# Patient Record
Sex: Male | Born: 1955 | Race: Black or African American | Hispanic: No | Marital: Single | Smoking: Never smoker
Health system: Southern US, Community
[De-identification: ages and names within clinical notes are randomized; demographics above are authoritative.]

## PROBLEM LIST (undated history)

## (undated) DIAGNOSIS — IMO0001 Reserved for inherently not codable concepts without codable children: Secondary | ICD-10-CM

## (undated) DIAGNOSIS — Z860100 Personal history of colon polyps, unspecified: Secondary | ICD-10-CM

## (undated) DIAGNOSIS — N184 Chronic kidney disease, stage 4 (severe): Secondary | ICD-10-CM

## (undated) DIAGNOSIS — G4730 Sleep apnea, unspecified: Secondary | ICD-10-CM

## (undated) DIAGNOSIS — M5430 Sciatica, unspecified side: Secondary | ICD-10-CM

## (undated) DIAGNOSIS — E785 Hyperlipidemia, unspecified: Secondary | ICD-10-CM

## (undated) DIAGNOSIS — K219 Gastro-esophageal reflux disease without esophagitis: Secondary | ICD-10-CM

## (undated) DIAGNOSIS — I4892 Unspecified atrial flutter: Secondary | ICD-10-CM

## (undated) DIAGNOSIS — I5020 Unspecified systolic (congestive) heart failure: Secondary | ICD-10-CM

## (undated) DIAGNOSIS — N2581 Secondary hyperparathyroidism of renal origin: Secondary | ICD-10-CM

## (undated) DIAGNOSIS — B1920 Unspecified viral hepatitis C without hepatic coma: Secondary | ICD-10-CM

## (undated) DIAGNOSIS — J189 Pneumonia, unspecified organism: Secondary | ICD-10-CM

## (undated) DIAGNOSIS — I10 Essential (primary) hypertension: Secondary | ICD-10-CM

## (undated) DIAGNOSIS — Z8619 Personal history of other infectious and parasitic diseases: Secondary | ICD-10-CM

## (undated) DIAGNOSIS — Z8709 Personal history of other diseases of the respiratory system: Secondary | ICD-10-CM

## (undated) DIAGNOSIS — M109 Gout, unspecified: Secondary | ICD-10-CM

## (undated) DIAGNOSIS — R350 Frequency of micturition: Secondary | ICD-10-CM

## (undated) DIAGNOSIS — I428 Other cardiomyopathies: Secondary | ICD-10-CM

## (undated) DIAGNOSIS — S81802A Unspecified open wound, left lower leg, initial encounter: Secondary | ICD-10-CM

## (undated) DIAGNOSIS — Z87442 Personal history of urinary calculi: Secondary | ICD-10-CM

## (undated) DIAGNOSIS — Z9289 Personal history of other medical treatment: Secondary | ICD-10-CM

## (undated) DIAGNOSIS — M2550 Pain in unspecified joint: Secondary | ICD-10-CM

## (undated) HISTORY — PX: OTHER SURGICAL HISTORY: SHX169

## (undated) HISTORY — PX: JOINT REPLACEMENT: SHX530

## (undated) HISTORY — DX: Personal history of other medical treatment: Z92.89

## (undated) HISTORY — PX: NEPHRECTOMY TRANSPLANTED ORGAN: SUR880

## (undated) HISTORY — PX: HIP SURGERY: SHX245

---

## 1994-01-27 DIAGNOSIS — B1920 Unspecified viral hepatitis C without hepatic coma: Secondary | ICD-10-CM

## 1994-01-27 HISTORY — DX: Unspecified viral hepatitis C without hepatic coma: B19.20

## 1998-08-04 ENCOUNTER — Emergency Department (HOSPITAL_COMMUNITY): Admission: EM | Admit: 1998-08-04 | Discharge: 1998-08-04 | Payer: Self-pay | Admitting: Emergency Medicine

## 2005-09-24 ENCOUNTER — Encounter: Admission: RE | Admit: 2005-09-24 | Discharge: 2005-09-24 | Payer: Self-pay | Admitting: Nephrology

## 2007-02-15 ENCOUNTER — Encounter: Admission: RE | Admit: 2007-02-15 | Discharge: 2007-02-15 | Payer: Self-pay | Admitting: General Surgery

## 2007-03-08 ENCOUNTER — Ambulatory Visit (HOSPITAL_COMMUNITY): Admission: RE | Admit: 2007-03-08 | Discharge: 2007-03-08 | Payer: Self-pay | Admitting: General Surgery

## 2007-03-08 ENCOUNTER — Encounter (INDEPENDENT_AMBULATORY_CARE_PROVIDER_SITE_OTHER): Payer: Self-pay | Admitting: General Surgery

## 2008-07-25 ENCOUNTER — Encounter: Payer: Self-pay | Admitting: Internal Medicine

## 2008-08-02 ENCOUNTER — Ambulatory Visit: Payer: Self-pay | Admitting: Internal Medicine

## 2008-08-15 ENCOUNTER — Ambulatory Visit: Payer: Self-pay | Admitting: Internal Medicine

## 2008-08-15 ENCOUNTER — Encounter: Payer: Self-pay | Admitting: Internal Medicine

## 2008-08-16 ENCOUNTER — Encounter: Payer: Self-pay | Admitting: Internal Medicine

## 2008-09-14 ENCOUNTER — Encounter: Payer: Self-pay | Admitting: Internal Medicine

## 2009-03-16 ENCOUNTER — Emergency Department (HOSPITAL_COMMUNITY): Admission: EM | Admit: 2009-03-16 | Discharge: 2009-03-16 | Payer: Self-pay | Admitting: Emergency Medicine

## 2009-03-30 ENCOUNTER — Ambulatory Visit: Payer: Self-pay | Admitting: Diagnostic Radiology

## 2009-03-30 ENCOUNTER — Encounter: Payer: Self-pay | Admitting: Emergency Medicine

## 2009-03-30 ENCOUNTER — Emergency Department (HOSPITAL_COMMUNITY): Admission: EM | Admit: 2009-03-30 | Discharge: 2009-03-30 | Payer: Self-pay | Admitting: Emergency Medicine

## 2009-03-31 ENCOUNTER — Ambulatory Visit: Payer: Self-pay | Admitting: Internal Medicine

## 2009-03-31 ENCOUNTER — Ambulatory Visit: Payer: Self-pay | Admitting: Critical Care Medicine

## 2009-03-31 ENCOUNTER — Inpatient Hospital Stay (HOSPITAL_COMMUNITY): Admission: EM | Admit: 2009-03-31 | Discharge: 2009-04-13 | Payer: Self-pay | Admitting: Emergency Medicine

## 2009-03-31 ENCOUNTER — Ambulatory Visit: Payer: Self-pay | Admitting: Diagnostic Radiology

## 2009-04-04 ENCOUNTER — Encounter (INDEPENDENT_AMBULATORY_CARE_PROVIDER_SITE_OTHER): Payer: Self-pay | Admitting: Internal Medicine

## 2009-04-30 ENCOUNTER — Encounter (HOSPITAL_COMMUNITY): Admission: RE | Admit: 2009-04-30 | Discharge: 2009-07-29 | Payer: Self-pay | Admitting: Nephrology

## 2009-06-21 ENCOUNTER — Encounter: Admission: RE | Admit: 2009-06-21 | Discharge: 2009-08-01 | Payer: Self-pay | Admitting: Orthopedic Surgery

## 2009-12-03 ENCOUNTER — Encounter: Payer: Self-pay | Admitting: Internal Medicine

## 2010-01-21 ENCOUNTER — Emergency Department (HOSPITAL_COMMUNITY)
Admission: EM | Admit: 2010-01-21 | Discharge: 2010-01-21 | Payer: Self-pay | Source: Home / Self Care | Admitting: Emergency Medicine

## 2010-01-23 ENCOUNTER — Emergency Department (HOSPITAL_COMMUNITY)
Admission: EM | Admit: 2010-01-23 | Discharge: 2010-01-23 | Payer: Self-pay | Source: Home / Self Care | Admitting: Emergency Medicine

## 2010-01-26 ENCOUNTER — Emergency Department (HOSPITAL_COMMUNITY)
Admission: EM | Admit: 2010-01-26 | Discharge: 2010-01-26 | Payer: Self-pay | Source: Home / Self Care | Admitting: Emergency Medicine

## 2010-02-17 ENCOUNTER — Encounter: Payer: Self-pay | Admitting: Nephrology

## 2010-02-28 NOTE — Letter (Signed)
[facility]   Imported By: Sherian Rein 01/10/2010 09:59:55  _____________________________________________________________________  External Attachment:    Type:   Image     Comment:   External Document

## 2010-04-21 LAB — GLUCOSE, CAPILLARY
Glucose-Capillary: 102 mg/dL — ABNORMAL HIGH (ref 70–99)
Glucose-Capillary: 105 mg/dL — ABNORMAL HIGH (ref 70–99)
Glucose-Capillary: 105 mg/dL — ABNORMAL HIGH (ref 70–99)
Glucose-Capillary: 107 mg/dL — ABNORMAL HIGH (ref 70–99)
Glucose-Capillary: 114 mg/dL — ABNORMAL HIGH (ref 70–99)
Glucose-Capillary: 116 mg/dL — ABNORMAL HIGH (ref 70–99)
Glucose-Capillary: 122 mg/dL — ABNORMAL HIGH (ref 70–99)
Glucose-Capillary: 126 mg/dL — ABNORMAL HIGH (ref 70–99)
Glucose-Capillary: 130 mg/dL — ABNORMAL HIGH (ref 70–99)
Glucose-Capillary: 131 mg/dL — ABNORMAL HIGH (ref 70–99)
Glucose-Capillary: 132 mg/dL — ABNORMAL HIGH (ref 70–99)
Glucose-Capillary: 135 mg/dL — ABNORMAL HIGH (ref 70–99)
Glucose-Capillary: 137 mg/dL — ABNORMAL HIGH (ref 70–99)
Glucose-Capillary: 140 mg/dL — ABNORMAL HIGH (ref 70–99)
Glucose-Capillary: 141 mg/dL — ABNORMAL HIGH (ref 70–99)
Glucose-Capillary: 144 mg/dL — ABNORMAL HIGH (ref 70–99)
Glucose-Capillary: 151 mg/dL — ABNORMAL HIGH (ref 70–99)

## 2010-04-21 LAB — CBC
HCT: 24.7 % — ABNORMAL LOW (ref 39.0–52.0)
HCT: 25.6 % — ABNORMAL LOW (ref 39.0–52.0)
HCT: 30.8 % — ABNORMAL LOW (ref 39.0–52.0)
HCT: 31.1 % — ABNORMAL LOW (ref 39.0–52.0)
Hemoglobin: 10 g/dL — ABNORMAL LOW (ref 13.0–17.0)
Hemoglobin: 8.2 g/dL — ABNORMAL LOW (ref 13.0–17.0)
Hemoglobin: 8.6 g/dL — ABNORMAL LOW (ref 13.0–17.0)
Hemoglobin: 9.7 g/dL — ABNORMAL LOW (ref 13.0–17.0)
Hemoglobin: 9.9 g/dL — ABNORMAL LOW (ref 13.0–17.0)
MCHC: 33 g/dL (ref 30.0–36.0)
MCHC: 33.2 g/dL (ref 30.0–36.0)
MCHC: 33.3 g/dL (ref 30.0–36.0)
MCHC: 33.4 g/dL (ref 30.0–36.0)
MCHC: 34 g/dL (ref 30.0–36.0)
MCV: 94.6 fL (ref 78.0–100.0)
MCV: 94.8 fL (ref 78.0–100.0)
MCV: 94.8 fL (ref 78.0–100.0)
MCV: 96.4 fL (ref 78.0–100.0)
MCV: 96.6 fL (ref 78.0–100.0)
Platelets: 100 10*3/uL — ABNORMAL LOW (ref 150–400)
Platelets: 105 10*3/uL — ABNORMAL LOW (ref 150–400)
Platelets: 198 10*3/uL (ref 150–400)
Platelets: 208 10*3/uL (ref 150–400)
Platelets: 237 10*3/uL (ref 150–400)
RBC: 2.64 MIL/uL — ABNORMAL LOW (ref 4.22–5.81)
RBC: 2.72 MIL/uL — ABNORMAL LOW (ref 4.22–5.81)
RBC: 2.8 MIL/uL — ABNORMAL LOW (ref 4.22–5.81)
RBC: 3.01 MIL/uL — ABNORMAL LOW (ref 4.22–5.81)
RBC: 3.01 MIL/uL — ABNORMAL LOW (ref 4.22–5.81)
RBC: 3.16 MIL/uL — ABNORMAL LOW (ref 4.22–5.81)
RBC: 3.19 MIL/uL — ABNORMAL LOW (ref 4.22–5.81)
RBC: 3.25 MIL/uL — ABNORMAL LOW (ref 4.22–5.81)
RDW: 15.2 % (ref 11.5–15.5)
RDW: 15.6 % — ABNORMAL HIGH (ref 11.5–15.5)
RDW: 15.7 % — ABNORMAL HIGH (ref 11.5–15.5)
RDW: 15.7 % — ABNORMAL HIGH (ref 11.5–15.5)
RDW: 15.8 % — ABNORMAL HIGH (ref 11.5–15.5)
RDW: 15.9 % — ABNORMAL HIGH (ref 11.5–15.5)
RDW: 16.1 % — ABNORMAL HIGH (ref 11.5–15.5)
WBC: 13.9 10*3/uL — ABNORMAL HIGH (ref 4.0–10.5)
WBC: 14.4 10*3/uL — ABNORMAL HIGH (ref 4.0–10.5)
WBC: 15.6 10*3/uL — ABNORMAL HIGH (ref 4.0–10.5)
WBC: 17.4 10*3/uL — ABNORMAL HIGH (ref 4.0–10.5)
WBC: 17.5 10*3/uL — ABNORMAL HIGH (ref 4.0–10.5)
WBC: 23.6 10*3/uL — ABNORMAL HIGH (ref 4.0–10.5)

## 2010-04-21 LAB — COMPREHENSIVE METABOLIC PANEL
ALT: 18 U/L (ref 0–53)
ALT: 9 U/L (ref 0–53)
Albumin: 1.9 g/dL — ABNORMAL LOW (ref 3.5–5.2)
Alkaline Phosphatase: 37 U/L — ABNORMAL LOW (ref 39–117)
BUN: 133 mg/dL — ABNORMAL HIGH (ref 6–23)
BUN: 138 mg/dL — ABNORMAL HIGH (ref 6–23)
CO2: 17 mEq/L — ABNORMAL LOW (ref 19–32)
CO2: 22 mEq/L (ref 19–32)
Calcium: 8.7 mg/dL (ref 8.4–10.5)
Chloride: 100 mEq/L (ref 96–112)
Chloride: 95 mEq/L — ABNORMAL LOW (ref 96–112)
Chloride: 96 mEq/L (ref 96–112)
Creatinine, Ser: 5.17 mg/dL — ABNORMAL HIGH (ref 0.4–1.5)
GFR calc non Af Amer: 19 mL/min — ABNORMAL LOW (ref >60)
Glucose, Bld: 111 mg/dL — ABNORMAL HIGH (ref 70–99)
Glucose, Bld: 84 mg/dL (ref 70–99)
Glucose, Bld: 97 mg/dL (ref 70–99)
Potassium: 5.9 mEq/L — ABNORMAL HIGH (ref 3.5–5.1)
Sodium: 127 mEq/L — ABNORMAL LOW (ref 135–145)
Sodium: 127 mEq/L — ABNORMAL LOW (ref 135–145)
Total Bilirubin: 0.7 mg/dL (ref 0.3–1.2)
Total Bilirubin: 0.9 mg/dL (ref 0.3–1.2)
Total Bilirubin: 0.9 mg/dL (ref 0.3–1.2)
Total Protein: 6 g/dL (ref 6.0–8.3)

## 2010-04-21 LAB — BLOOD GAS, ARTERIAL
Acid-base deficit: 4.7 mmol/L — ABNORMAL HIGH (ref 0.0–2.0)
Drawn by: 325261
FIO2: 1 %
O2 Saturation: 96.9 %
pO2, Arterial: 83.9 mmHg (ref 80.0–100.0)

## 2010-04-21 LAB — BASIC METABOLIC PANEL
BUN: 121 mg/dL — ABNORMAL HIGH (ref 6–23)
BUN: 35 mg/dL — ABNORMAL HIGH (ref 6–23)
BUN: 43 mg/dL — ABNORMAL HIGH (ref 6–23)
CO2: 18 mEq/L — ABNORMAL LOW (ref 19–32)
CO2: 19 mEq/L (ref 19–32)
CO2: 22 mEq/L (ref 19–32)
CO2: 27 mEq/L (ref 19–32)
Calcium: 7.5 mg/dL — ABNORMAL LOW (ref 8.4–10.5)
Calcium: 8.2 mg/dL — ABNORMAL LOW (ref 8.4–10.5)
Calcium: 8.3 mg/dL — ABNORMAL LOW (ref 8.4–10.5)
Calcium: 8.5 mg/dL (ref 8.4–10.5)
Calcium: 8.7 mg/dL (ref 8.4–10.5)
Chloride: 101 mEq/L (ref 96–112)
Chloride: 102 mEq/L (ref 96–112)
Creatinine, Ser: 1.81 mg/dL — ABNORMAL HIGH (ref 0.4–1.5)
Creatinine, Ser: 3.28 mg/dL — ABNORMAL HIGH (ref 0.4–1.5)
Creatinine, Ser: 4.48 mg/dL — ABNORMAL HIGH (ref 0.4–1.5)
GFR calc Af Amer: 15 mL/min — ABNORMAL LOW (ref >60)
GFR calc Af Amer: 17 mL/min — ABNORMAL LOW (ref >60)
GFR calc Af Amer: 24 mL/min — ABNORMAL LOW (ref >60)
GFR calc Af Amer: 44 mL/min — ABNORMAL LOW (ref >60)
GFR calc Af Amer: 44 mL/min — ABNORMAL LOW (ref >60)
GFR calc non Af Amer: 18 mL/min — ABNORMAL LOW (ref >60)
GFR calc non Af Amer: 37 mL/min — ABNORMAL LOW (ref >60)
GFR calc non Af Amer: 37 mL/min — ABNORMAL LOW (ref >60)
Glucose, Bld: 111 mg/dL — ABNORMAL HIGH (ref 70–99)
Glucose, Bld: 115 mg/dL — ABNORMAL HIGH (ref 70–99)
Glucose, Bld: 124 mg/dL — ABNORMAL HIGH (ref 70–99)
Glucose, Bld: 128 mg/dL — ABNORMAL HIGH (ref 70–99)
Potassium: 4.3 mEq/L (ref 3.5–5.1)
Potassium: 4.9 mEq/L (ref 3.5–5.1)
Sodium: 124 mEq/L — ABNORMAL LOW (ref 135–145)
Sodium: 126 mEq/L — ABNORMAL LOW (ref 135–145)
Sodium: 135 mEq/L (ref 135–145)
Sodium: 137 mEq/L (ref 135–145)
Sodium: 144 mEq/L (ref 135–145)

## 2010-04-21 LAB — CULTURE, BLOOD (ROUTINE X 2)

## 2010-04-21 LAB — RENAL FUNCTION PANEL
BUN: 72 mg/dL — ABNORMAL HIGH (ref 6–23)
BUN: 85 mg/dL — ABNORMAL HIGH (ref 6–23)
CO2: 23 mEq/L (ref 19–32)
CO2: 24 mEq/L (ref 19–32)
CO2: 28 mEq/L (ref 19–32)
CO2: 29 mEq/L (ref 19–32)
CO2: 29 mEq/L (ref 19–32)
CO2: 30 mEq/L (ref 19–32)
Calcium: 8.5 mg/dL (ref 8.4–10.5)
Calcium: 8.5 mg/dL (ref 8.4–10.5)
Chloride: 100 mEq/L (ref 96–112)
Chloride: 107 mEq/L (ref 96–112)
Chloride: 108 mEq/L (ref 96–112)
Chloride: 94 mEq/L — ABNORMAL LOW (ref 96–112)
Chloride: 95 mEq/L — ABNORMAL LOW (ref 96–112)
Creatinine, Ser: 2 mg/dL — ABNORMAL HIGH (ref 0.4–1.5)
Creatinine, Ser: 2.06 mg/dL — ABNORMAL HIGH (ref 0.4–1.5)
Creatinine, Ser: 2.19 mg/dL — ABNORMAL HIGH (ref 0.4–1.5)
Creatinine, Ser: 2.89 mg/dL — ABNORMAL HIGH (ref 0.4–1.5)
Creatinine, Ser: 3.8 mg/dL — ABNORMAL HIGH (ref 0.4–1.5)
GFR calc Af Amer: 28 mL/min — ABNORMAL LOW (ref >60)
GFR calc Af Amer: 31 mL/min — ABNORMAL LOW (ref >60)
GFR calc Af Amer: 38 mL/min — ABNORMAL LOW (ref >60)
GFR calc Af Amer: 41 mL/min — ABNORMAL LOW (ref >60)
GFR calc non Af Amer: 23 mL/min — ABNORMAL LOW (ref >60)
GFR calc non Af Amer: 26 mL/min — ABNORMAL LOW (ref >60)
GFR calc non Af Amer: 32 mL/min — ABNORMAL LOW (ref >60)
GFR calc non Af Amer: 34 mL/min — ABNORMAL LOW (ref >60)
Glucose, Bld: 108 mg/dL — ABNORMAL HIGH (ref 70–99)
Glucose, Bld: 110 mg/dL — ABNORMAL HIGH (ref 70–99)
Glucose, Bld: 111 mg/dL — ABNORMAL HIGH (ref 70–99)
Glucose, Bld: 117 mg/dL — ABNORMAL HIGH (ref 70–99)
Glucose, Bld: 135 mg/dL — ABNORMAL HIGH (ref 70–99)
Potassium: 4 mEq/L (ref 3.5–5.1)
Potassium: 5 mEq/L (ref 3.5–5.1)
Sodium: 145 mEq/L (ref 135–145)

## 2010-04-21 LAB — CULTURE, BAL-QUANTITATIVE W GRAM STAIN: Colony Count: 15000

## 2010-04-21 LAB — DIFFERENTIAL
Basophils Absolute: 0 10*3/uL (ref 0.0–0.1)
Basophils Absolute: 0 10*3/uL (ref 0.0–0.1)
Basophils Relative: 0 % (ref 0–1)
Eosinophils Absolute: 0.2 10*3/uL (ref 0.0–0.7)
Eosinophils Relative: 1 % (ref 0–5)
Lymphocytes Relative: 8 % — ABNORMAL LOW (ref 12–46)
Neutro Abs: 13.6 10*3/uL — ABNORMAL HIGH (ref 1.7–7.7)
Neutrophils Relative %: 79 % — ABNORMAL HIGH (ref 43–77)
Neutrophils Relative %: 80 % — ABNORMAL HIGH (ref 43–77)

## 2010-04-21 LAB — MRSA CULTURE

## 2010-04-21 LAB — POCT I-STAT 3, ART BLOOD GAS (G3+)
Bicarbonate: 19.1 mEq/L — ABNORMAL LOW (ref 20.0–24.0)
O2 Saturation: 93 %
TCO2: 20 mmol/L (ref 0–100)
pCO2 arterial: 37.8 mmHg (ref 35.0–45.0)
pO2, Arterial: 75 mmHg — ABNORMAL LOW (ref 80.0–100.0)

## 2010-04-21 LAB — URINE CULTURE

## 2010-04-21 LAB — URINALYSIS, MICROSCOPIC ONLY
Bilirubin Urine: NEGATIVE
Ketones, ur: NEGATIVE mg/dL
Leukocytes, UA: NEGATIVE
Nitrite: NEGATIVE
Specific Gravity, Urine: 1.012 (ref 1.005–1.030)
Urobilinogen, UA: 1 mg/dL (ref 0.0–1.0)

## 2010-04-21 LAB — MRSA PCR SCREENING: MRSA by PCR: NEGATIVE

## 2010-04-21 LAB — URINALYSIS, ROUTINE W REFLEX MICROSCOPIC
Ketones, ur: NEGATIVE mg/dL
Nitrite: NEGATIVE
Nitrite: NEGATIVE
Protein, ur: NEGATIVE mg/dL
Specific Gravity, Urine: 1.012 (ref 1.005–1.030)
Urobilinogen, UA: 0.2 mg/dL (ref 0.0–1.0)
Urobilinogen, UA: 0.2 mg/dL (ref 0.0–1.0)

## 2010-04-21 LAB — LACTIC ACID, PLASMA: Lactic Acid, Venous: 0.6 mmol/L (ref 0.5–2.2)

## 2010-04-21 LAB — URINE MICROSCOPIC-ADD ON

## 2010-04-21 LAB — PROTIME-INR
INR: 1.23 (ref 0.00–1.49)
Prothrombin Time: 15.4 seconds — ABNORMAL HIGH (ref 11.6–15.2)

## 2010-04-21 LAB — CK: Total CK: 89 U/L (ref 7–232)

## 2010-04-21 LAB — CARBOXYHEMOGLOBIN
Carboxyhemoglobin: 1.2 % (ref 0.5–1.5)
Methemoglobin: 1.1 % (ref 0.0–1.5)
O2 Saturation: 77.4 %

## 2010-04-21 LAB — LIPASE, BLOOD: Lipase: 21 U/L (ref 11–59)

## 2010-04-21 LAB — CARDIAC PANEL(CRET KIN+CKTOT+MB+TROPI)
CK, MB: 2.1 ng/mL (ref 0.3–4.0)
Relative Index: 1.7 (ref 0.0–2.5)
Troponin I: 0.02 ng/mL (ref 0.00–0.06)

## 2010-04-21 LAB — PHOSPHORUS: Phosphorus: 7.1 mg/dL — ABNORMAL HIGH (ref 2.3–4.6)

## 2010-04-21 LAB — MAGNESIUM: Magnesium: 1.9 mg/dL (ref 1.5–2.5)

## 2010-04-21 LAB — TYPE AND SCREEN: Antibody Screen: NEGATIVE

## 2010-04-21 LAB — D-DIMER, QUANTITATIVE: D-Dimer, Quant: 13.45 ug/mL-FEU — ABNORMAL HIGH (ref 0.00–0.48)

## 2010-04-21 LAB — VANCOMYCIN, TROUGH: Vancomycin Tr: 32.3 ug/mL (ref 10.0–20.0)

## 2010-04-22 LAB — CBC
HCT: 33.3 % — ABNORMAL LOW (ref 39.0–52.0)
Hemoglobin: 11.3 g/dL — ABNORMAL LOW (ref 13.0–17.0)
MCV: 95.4 fL (ref 78.0–100.0)
RBC: 3.49 MIL/uL — ABNORMAL LOW (ref 4.22–5.81)
WBC: 15.3 10*3/uL — ABNORMAL HIGH (ref 4.0–10.5)

## 2010-04-22 LAB — DIFFERENTIAL
Eosinophils Absolute: 0.2 10*3/uL (ref 0.0–0.7)
Eosinophils Relative: 2 % (ref 0–5)
Lymphs Abs: 1.2 10*3/uL (ref 0.7–4.0)
Monocytes Absolute: 1.4 10*3/uL — ABNORMAL HIGH (ref 0.1–1.0)
Monocytes Relative: 9 % (ref 3–12)

## 2010-04-22 LAB — BASIC METABOLIC PANEL
BUN: 53 mg/dL — ABNORMAL HIGH (ref 6–23)
Chloride: 100 mEq/L (ref 96–112)
GFR calc Af Amer: 43 mL/min — ABNORMAL LOW (ref >60)
Potassium: 4.5 mEq/L (ref 3.5–5.1)
Sodium: 139 mEq/L (ref 135–145)

## 2010-06-11 NOTE — Op Note (Signed)
NAMEERICE, AHLES              ACCOUNT NO.:  [id number]   MEDICAL RECORD NO.:  [id number]          PATIENT TYPE:  AMB   LOCATION:  SDS                          FACILITY:  [facility]   PHYSICIAN:  Anselm Pancoast. 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   POSTOPERATIVE DIAGNOSIS:  Internal cuff remains and a kind of a fibrous  tract from old chronic ambulatory peritoneal dialysis catheter removal.   OPERATION:  Removal of basically fibrous tract and internal cuff of CAPD  catheter.   ANESTHESIA:  General.   SURGEON:  Anselm Pancoast. Zachery Dakins, M.D.   HISTORY:  Joselito Fieldhouse is a 55 year old male who approximately 20  years ago or a little less had a CAPD catheter placed which he used  nicely and then about 14 years ago had a transplant over at [facility]  The transplant has done nicely, and he had his CAPD catheter removed  there prior to his discharge after the transplant.  He has done well,  with the exception of approximately 4 or 5 months ago he had an  infection of the abdominal wall.  He was admitted over at [facility]  never determined the etiology, and what antibiotics he was treated with  I am not sure.  He then at about Christmas had another secondary  infection and this time was placed on antibiotics by Dr. Darrick Penna.  He  saw Dr. Carolynne Edouard in the office who I&D'd a little area that looked like a  little abscess.  No culture was taken, and then I saw the patient  approximately a week  to 10 days later.  At that time, the little area appeared to be healing  up.  We got a CT.  Dr. Darrick Penna had questioned  whether there was a  hernia.  There was no evidence of any hernia, but you could see a  fibrous tract where the catheter used to be and what looked like an  internal cuff to me on the CT.  Since he has been 14 years and certainly  has not had a recurring problem, but Dr. Caryn Section who is his regular  nephrologist felt that since this is the second time he is on  immunosuppressants for his kidney transplant, and he recommended that we  explore the area and excise the tract and if there was any internal cuff  present, to obviously remove that also.  I was in agreement with this,  and the patient was added to the OR schedule for today.  There have been  no cultures actually of this infection, so we gave him vancomycin, and  then postoperatively I am going to place him on doxycycline and await  the results of the cultures.  Potassium was 3.2 this morning, and his  glucose  was about 111.  He took his transplant medications this morning.   DESCRIPTION OF PROCEDURE:  He was taken back to the operative suite.  Induction of general anesthesia and endotracheal tube and oral tube into  the stomach, and then the abdomen was prepped with Betadine surgical  scrub and solution and was draped in a sterile manner.   You could feel a thickening where the catheter used to be, and I elected  to do a transverse skin incision because we have to remove the external  and the insertion tract.  After ellipsing out a little area of the skin,  then you could dissect on down closer to where the actual tract would  be, but I did not try to skeletonize it but just took about a 1-inch  plug of the adipose tissue around the tract.  Then, really right at the  rectus fascia, this was opened.  You could see the sutures that had been  previously placed, and you could feel a definite fibrous something or  other like an internal cuff.  I then went wide, freeing up the rectus  muscle.  I could obviously see some of the Dacron cuff  fibers in this  area, and then we stripped the muscle away from the plug.  There were a  couple of little areas of bleeders that were sutured with 3-0 Vicryl  sutures.  I then opened the peritoneum laterally so I could see and  feel, and the peritoneum looked fine and the very small ellipse of the  peritoneum right around where the cuff was.  I then closed this with a  running 2-0 Vicryl under direct vision and placed a little Marcaine in  the incision, that is plain Marcaine 0.5%.  Then the rectus muscle was  approximated with some interrupted 2-0 Vicryls, and then the anterior  rectus fascia was closed transversely with seven sutures of #1 Prolene  sutures.  I then anesthetized the fascia externally with another  probably about 20 mL of the 0.5% Marcaine total used.  Next, the  subcutaneous tissue was approximated with 3-0 Vicryl and a couple of 3-0  Vicryl kind of right under the dermis and then skin staples.  On the  back table, I opened the area where I could feel plug, and there was  definitely the internal cuff  that was still present.  There was no  catheter remaining, and obviously when they took the catheter, they were  dissected right down and the flange was what has been present all along.  I sent parts of this and the little swabbing of the tract for aerobic  and anaerobic cultures.  I am going to send him home on doxycycline  since he certainly responded to that previously, and we will see him  back in the office in approximately 3 days.  He will continue with his  chronic transplant medications, Vicodin for pain,  and we will remove  the staples in about 10 days.           ______________________________  Anselm Pancoast. Zachery Dakins, M.D.     WJW/MEDQ  D:  03/08/2007  T:  03/09/2007  Job:  [id number]   cc:   Wilber Bihari. Caryn Section, M.D.

## 2010-10-18 LAB — CBC
HCT: 38.4 — ABNORMAL LOW
MCHC: 33
MCV: 82.9
Platelets: 169

## 2010-10-18 LAB — WOUND CULTURE

## 2010-10-18 LAB — COMPREHENSIVE METABOLIC PANEL
AST: 35
Albumin: 3.4 — ABNORMAL LOW
BUN: 32 — ABNORMAL HIGH
Calcium: 9.1
Creatinine, Ser: 1.82 — ABNORMAL HIGH
GFR calc Af Amer: 48 — ABNORMAL LOW
GFR calc non Af Amer: 40 — ABNORMAL LOW

## 2010-10-18 LAB — ANAEROBIC CULTURE

## 2011-06-03 ENCOUNTER — Inpatient Hospital Stay (HOSPITAL_COMMUNITY)
Admission: EM | Admit: 2011-06-03 | Discharge: 2011-06-27 | DRG: 878 | Disposition: A | Discharge status: Rehabilitation Facility | Payer: BC Managed Care – PPO | Source: Ambulatory Visit | Attending: Pulmonary Disease | Admitting: Pulmonary Disease

## 2011-06-03 ENCOUNTER — Encounter (HOSPITAL_COMMUNITY): Payer: Self-pay | Admitting: *Deleted

## 2011-06-03 ENCOUNTER — Emergency Department (HOSPITAL_COMMUNITY): Payer: BC Managed Care – PPO

## 2011-06-03 ENCOUNTER — Emergency Department (HOSPITAL_COMMUNITY)
Admission: EM | Admit: 2011-06-03 | Discharge: 2011-06-03 | Discharge status: Against Medical Advice | Payer: BC Managed Care – PPO | Source: Home / Self Care

## 2011-06-03 DIAGNOSIS — J189 Pneumonia, unspecified organism: Secondary | ICD-10-CM

## 2011-06-03 DIAGNOSIS — R0902 Hypoxemia: Secondary | ICD-10-CM

## 2011-06-03 DIAGNOSIS — Q820 Hereditary lymphedema: Secondary | ICD-10-CM

## 2011-06-03 DIAGNOSIS — E871 Hypo-osmolality and hyponatremia: Secondary | ICD-10-CM

## 2011-06-03 DIAGNOSIS — E669 Obesity, unspecified: Secondary | ICD-10-CM | POA: Diagnosis present

## 2011-06-03 DIAGNOSIS — T8610 Unspecified complication of kidney transplant: Secondary | ICD-10-CM

## 2011-06-03 DIAGNOSIS — J9600 Acute respiratory failure, unspecified whether with hypoxia or hypercapnia: Secondary | ICD-10-CM

## 2011-06-03 DIAGNOSIS — E139 Other specified diabetes mellitus without complications: Secondary | ICD-10-CM | POA: Diagnosis not present

## 2011-06-03 DIAGNOSIS — N186 End stage renal disease: Secondary | ICD-10-CM | POA: Diagnosis present

## 2011-06-03 DIAGNOSIS — S025XXA Fracture of tooth (traumatic), initial encounter for closed fracture: Secondary | ICD-10-CM | POA: Diagnosis present

## 2011-06-03 DIAGNOSIS — J1100 Influenza due to unidentified influenza virus with unspecified type of pneumonia: Secondary | ICD-10-CM | POA: Diagnosis present

## 2011-06-03 DIAGNOSIS — R600 Localized edema: Secondary | ICD-10-CM

## 2011-06-03 DIAGNOSIS — D72829 Elevated white blood cell count, unspecified: Secondary | ICD-10-CM

## 2011-06-03 DIAGNOSIS — N170 Acute kidney failure with tubular necrosis: Secondary | ICD-10-CM | POA: Diagnosis present

## 2011-06-03 DIAGNOSIS — E876 Hypokalemia: Secondary | ICD-10-CM | POA: Diagnosis not present

## 2011-06-03 DIAGNOSIS — R579 Shock, unspecified: Secondary | ICD-10-CM

## 2011-06-03 DIAGNOSIS — IMO0002 Reserved for concepts with insufficient information to code with codable children: Secondary | ICD-10-CM | POA: Diagnosis present

## 2011-06-03 DIAGNOSIS — E860 Dehydration: Secondary | ICD-10-CM | POA: Diagnosis present

## 2011-06-03 DIAGNOSIS — E46 Unspecified protein-calorie malnutrition: Secondary | ICD-10-CM | POA: Diagnosis present

## 2011-06-03 DIAGNOSIS — I5031 Acute diastolic (congestive) heart failure: Secondary | ICD-10-CM

## 2011-06-03 DIAGNOSIS — N2589 Other disorders resulting from impaired renal tubular function: Secondary | ICD-10-CM | POA: Diagnosis present

## 2011-06-03 DIAGNOSIS — A419 Sepsis, unspecified organism: Principal | ICD-10-CM

## 2011-06-03 DIAGNOSIS — T380X5A Adverse effect of glucocorticoids and synthetic analogues, initial encounter: Secondary | ICD-10-CM | POA: Diagnosis not present

## 2011-06-03 DIAGNOSIS — N2581 Secondary hyperparathyroidism of renal origin: Secondary | ICD-10-CM | POA: Diagnosis not present

## 2011-06-03 DIAGNOSIS — K1230 Oral mucositis (ulcerative), unspecified: Secondary | ICD-10-CM | POA: Diagnosis present

## 2011-06-03 DIAGNOSIS — D696 Thrombocytopenia, unspecified: Secondary | ICD-10-CM

## 2011-06-03 DIAGNOSIS — K0401 Reversible pulpitis: Secondary | ICD-10-CM | POA: Diagnosis not present

## 2011-06-03 DIAGNOSIS — I120 Hypertensive chronic kidney disease with stage 5 chronic kidney disease or end stage renal disease: Secondary | ICD-10-CM | POA: Diagnosis present

## 2011-06-03 DIAGNOSIS — M109 Gout, unspecified: Secondary | ICD-10-CM | POA: Diagnosis present

## 2011-06-03 DIAGNOSIS — N179 Acute kidney failure, unspecified: Secondary | ICD-10-CM

## 2011-06-03 DIAGNOSIS — I5021 Acute systolic (congestive) heart failure: Secondary | ICD-10-CM

## 2011-06-03 DIAGNOSIS — E8779 Other fluid overload: Secondary | ICD-10-CM | POA: Diagnosis not present

## 2011-06-03 DIAGNOSIS — Z940 Kidney transplant status: Secondary | ICD-10-CM

## 2011-06-03 DIAGNOSIS — Z8619 Personal history of other infectious and parasitic diseases: Secondary | ICD-10-CM

## 2011-06-03 DIAGNOSIS — E8881 Metabolic syndrome: Secondary | ICD-10-CM | POA: Diagnosis present

## 2011-06-03 DIAGNOSIS — K121 Other forms of stomatitis: Secondary | ICD-10-CM | POA: Diagnosis present

## 2011-06-03 DIAGNOSIS — D509 Iron deficiency anemia, unspecified: Secondary | ICD-10-CM | POA: Diagnosis present

## 2011-06-03 DIAGNOSIS — I420 Dilated cardiomyopathy: Secondary | ICD-10-CM | POA: Diagnosis present

## 2011-06-03 DIAGNOSIS — G7281 Critical illness myopathy: Secondary | ICD-10-CM

## 2011-06-03 DIAGNOSIS — R6521 Severe sepsis with septic shock: Secondary | ICD-10-CM

## 2011-06-03 DIAGNOSIS — I2510 Atherosclerotic heart disease of native coronary artery without angina pectoris: Secondary | ICD-10-CM | POA: Diagnosis present

## 2011-06-03 DIAGNOSIS — L0291 Cutaneous abscess, unspecified: Secondary | ICD-10-CM | POA: Diagnosis not present

## 2011-06-03 DIAGNOSIS — N289 Disorder of kidney and ureter, unspecified: Secondary | ICD-10-CM

## 2011-06-03 DIAGNOSIS — I4892 Unspecified atrial flutter: Secondary | ICD-10-CM

## 2011-06-03 DIAGNOSIS — J80 Acute respiratory distress syndrome: Secondary | ICD-10-CM

## 2011-06-03 DIAGNOSIS — I498 Other specified cardiac arrhythmias: Secondary | ICD-10-CM | POA: Diagnosis present

## 2011-06-03 DIAGNOSIS — I509 Heart failure, unspecified: Secondary | ICD-10-CM

## 2011-06-03 DIAGNOSIS — I428 Other cardiomyopathies: Secondary | ICD-10-CM | POA: Diagnosis present

## 2011-06-03 DIAGNOSIS — I4891 Unspecified atrial fibrillation: Secondary | ICD-10-CM | POA: Diagnosis present

## 2011-06-03 DIAGNOSIS — Z79899 Other long term (current) drug therapy: Secondary | ICD-10-CM

## 2011-06-03 HISTORY — DX: Essential (primary) hypertension: I10

## 2011-06-03 LAB — COMPREHENSIVE METABOLIC PANEL
ALT: 26 U/L (ref 0–53)
AST: 55 U/L — ABNORMAL HIGH (ref 0–37)
Alkaline Phosphatase: 53 U/L (ref 39–117)
CO2: 24 mEq/L (ref 19–32)
Calcium: 9.4 mg/dL (ref 8.4–10.5)
GFR calc Af Amer: 38 mL/min — ABNORMAL LOW (ref >90)
GFR calc non Af Amer: 33 mL/min — ABNORMAL LOW (ref >90)
Glucose, Bld: 122 mg/dL — ABNORMAL HIGH (ref 70–99)
Potassium: 4.2 mEq/L (ref 3.5–5.1)
Sodium: 130 mEq/L — ABNORMAL LOW (ref 135–145)
Total Protein: 7.5 g/dL (ref 6.0–8.3)

## 2011-06-03 LAB — CBC
Hemoglobin: 13.9 g/dL (ref 13.0–17.0)
Platelets: 116 10*3/uL — ABNORMAL LOW (ref 150–400)
RBC: 4.64 MIL/uL (ref 4.22–5.81)
WBC: 17.6 10*3/uL — ABNORMAL HIGH (ref 4.0–10.5)

## 2011-06-03 LAB — URINE MICROSCOPIC-ADD ON

## 2011-06-03 LAB — URINALYSIS, ROUTINE W REFLEX MICROSCOPIC
Bilirubin Urine: NEGATIVE
Leukocytes, UA: NEGATIVE
Nitrite: NEGATIVE
Specific Gravity, Urine: 1.015 (ref 1.005–1.030)
Urobilinogen, UA: 1 mg/dL (ref 0.0–1.0)
pH: 5.5 (ref 5.0–8.0)

## 2011-06-03 MED ORDER — DILTIAZEM HCL 100 MG IV SOLR
5.0000 mg/h | Freq: Once | INTRAVENOUS | Status: AC
  Administered 2011-06-04: 5 mg/h via INTRAVENOUS

## 2011-06-03 MED ORDER — ACETAMINOPHEN 325 MG PO TABS
650.0000 mg | ORAL_TABLET | Freq: Once | ORAL | Status: AC
  Administered 2011-06-03: 650 mg via ORAL
  Filled 2011-06-03: qty 2

## 2011-06-03 MED ORDER — PIPERACILLIN-TAZOBACTAM 3.375 G IVPB
3.3750 g | Freq: Once | INTRAVENOUS | Status: AC
  Administered 2011-06-04: 3.375 g via INTRAVENOUS
  Filled 2011-06-03: qty 50

## 2011-06-03 MED ORDER — SODIUM CHLORIDE 0.9 % IV BOLUS (SEPSIS)
500.0000 mL | Freq: Once | INTRAVENOUS | Status: AC
  Administered 2011-06-04: 500 mL via INTRAVENOUS

## 2011-06-03 MED ORDER — VANCOMYCIN HCL IN DEXTROSE 1-5 GM/200ML-% IV SOLN
1000.0000 mg | Freq: Once | INTRAVENOUS | Status: AC
  Administered 2011-06-03: 1000 mg via INTRAVENOUS
  Filled 2011-06-03: qty 200

## 2011-06-03 NOTE — ED Notes (Signed)
Jewelry, bracelet given to son, Feliz Beam.

## 2011-06-03 NOTE — ED Notes (Signed)
Pt placed on NRB mask for increase oxygen due to pt Sats are 91% 4L via nasal cannula. Pt INAD, resp e/u, pt denies any shortness of breath at this time with resp rate ate 26. Plan of care updated with verbal understanding. Family at bedside, will continue to monitor pt, pt will be medicated per [facility]

## 2011-06-03 NOTE — ED Notes (Signed)
The pt has been ill for 3 days.  Coughing vomiting  Aching  All over abd pain with a headache.  No temp

## 2011-06-03 NOTE — ED Notes (Signed)
Kidney transplant

## 2011-06-04 ENCOUNTER — Inpatient Hospital Stay (HOSPITAL_COMMUNITY): Payer: BC Managed Care – PPO

## 2011-06-04 ENCOUNTER — Encounter (HOSPITAL_COMMUNITY): Payer: Self-pay | Admitting: Internal Medicine

## 2011-06-04 DIAGNOSIS — J189 Pneumonia, unspecified organism: Secondary | ICD-10-CM

## 2011-06-04 DIAGNOSIS — J80 Acute respiratory distress syndrome: Secondary | ICD-10-CM | POA: Diagnosis not present

## 2011-06-04 DIAGNOSIS — R0902 Hypoxemia: Secondary | ICD-10-CM

## 2011-06-04 DIAGNOSIS — E871 Hypo-osmolality and hyponatremia: Secondary | ICD-10-CM

## 2011-06-04 DIAGNOSIS — A413 Sepsis due to Hemophilus influenzae: Secondary | ICD-10-CM

## 2011-06-04 DIAGNOSIS — N179 Acute kidney failure, unspecified: Secondary | ICD-10-CM

## 2011-06-04 DIAGNOSIS — I4892 Unspecified atrial flutter: Secondary | ICD-10-CM

## 2011-06-04 DIAGNOSIS — J9600 Acute respiratory failure, unspecified whether with hypoxia or hypercapnia: Secondary | ICD-10-CM | POA: Diagnosis present

## 2011-06-04 DIAGNOSIS — J9589 Other postprocedural complications and disorders of respiratory system, not elsewhere classified: Secondary | ICD-10-CM

## 2011-06-04 DIAGNOSIS — D696 Thrombocytopenia, unspecified: Secondary | ICD-10-CM | POA: Diagnosis present

## 2011-06-04 DIAGNOSIS — R600 Localized edema: Secondary | ICD-10-CM | POA: Diagnosis present

## 2011-06-04 DIAGNOSIS — A419 Sepsis, unspecified organism: Secondary | ICD-10-CM | POA: Diagnosis present

## 2011-06-04 DIAGNOSIS — E860 Dehydration: Secondary | ICD-10-CM | POA: Diagnosis present

## 2011-06-04 DIAGNOSIS — I509 Heart failure, unspecified: Secondary | ICD-10-CM

## 2011-06-04 DIAGNOSIS — M7989 Other specified soft tissue disorders: Secondary | ICD-10-CM

## 2011-06-04 LAB — POCT I-STAT 3, ART BLOOD GAS (G3+)
O2 Saturation: 99 %
Patient temperature: 98.6
Patient temperature: 98.6
Patient temperature: 98.6
pCO2 arterial: 48.8 mmHg — ABNORMAL HIGH (ref 35.0–45.0)
pCO2 arterial: 36.6 mmHg (ref 35.0–45.0)
pCO2 arterial: 40.6 mmHg (ref 35.0–45.0)
pH, Arterial: 7.402 (ref 7.350–7.450)
pH, Arterial: 7.394 (ref 7.350–7.450)
pH, Arterial: 7.467 — ABNORMAL HIGH (ref 7.350–7.450)
pH, Arterial: 7.426 (ref 7.350–7.450)

## 2011-06-04 LAB — PROCALCITONIN: Procalcitonin: 4.74 ng/mL

## 2011-06-04 LAB — BASIC METABOLIC PANEL
BUN: 63 mg/dL — ABNORMAL HIGH (ref 6–23)
GFR calc Af Amer: 40 mL/min — ABNORMAL LOW (ref >90)
GFR calc non Af Amer: 35 mL/min — ABNORMAL LOW (ref >90)
Potassium: 4 mEq/L (ref 3.5–5.1)

## 2011-06-04 LAB — DIFFERENTIAL
Basophils Relative: 0 % (ref 0–1)
Basophils Relative: 0 % (ref 0–1)
Eosinophils Absolute: 0 10*3/uL (ref 0.0–0.7)
Eosinophils Relative: 0 % (ref 0–5)
Lymphocytes Relative: 12 % (ref 12–46)
Lymphs Abs: 1.7 10*3/uL (ref 0.7–4.0)
Monocytes Absolute: 1.9 10*3/uL — ABNORMAL HIGH (ref 0.1–1.0)
Monocytes Absolute: 2.4 10*3/uL — ABNORMAL HIGH (ref 0.1–1.0)
Monocytes Relative: 11 % (ref 3–12)
Neutrophils Relative %: 77 % (ref 43–77)
Neutrophils Relative %: 74 % (ref 43–77)

## 2011-06-04 LAB — PRO B NATRIURETIC PEPTIDE: Pro B Natriuretic peptide (BNP): 36868 pg/mL — ABNORMAL HIGH (ref 0–125)

## 2011-06-04 LAB — CARDIAC PANEL(CRET KIN+CKTOT+MB+TROPI)
Relative Index: 0.8 (ref 0.0–2.5)
Relative Index: 1.3 (ref 0.0–2.5)
Troponin I: <0.3 ng/mL (ref <0.30)

## 2011-06-04 LAB — STREP PNEUMONIAE URINARY ANTIGEN
Strep Pneumo Urinary Antigen: NEGATIVE
Strep Pneumo Urinary Antigen: NEGATIVE

## 2011-06-04 LAB — CBC
Hemoglobin: 13.3 g/dL (ref 13.0–17.0)
MCH: 29.7 pg (ref 26.0–34.0)
MCHC: 32.7 g/dL (ref 30.0–36.0)
Platelets: 112 10*3/uL — ABNORMAL LOW (ref 150–400)

## 2011-06-04 LAB — GLUCOSE, CAPILLARY
Glucose-Capillary: 105 mg/dL — ABNORMAL HIGH (ref 70–99)
Glucose-Capillary: 147 mg/dL — ABNORMAL HIGH (ref 70–99)

## 2011-06-04 LAB — EXPECTORATED SPUTUM ASSESSMENT W GRAM STAIN, RFLX TO RESP C

## 2011-06-04 LAB — COMPREHENSIVE METABOLIC PANEL
ALT: 23 U/L (ref 0–53)
AST: 49 U/L — ABNORMAL HIGH (ref 0–37)
CO2: 28 mEq/L (ref 19–32)
Chloride: 95 mEq/L — ABNORMAL LOW (ref 96–112)
GFR calc non Af Amer: 32 mL/min — ABNORMAL LOW (ref >90)
Sodium: 134 mEq/L — ABNORMAL LOW (ref 135–145)
Total Bilirubin: 1 mg/dL (ref 0.3–1.2)

## 2011-06-04 LAB — LACTIC ACID, PLASMA: Lactic Acid, Venous: 1 mmol/L (ref 0.5–2.2)

## 2011-06-04 MED ORDER — ENOXAPARIN SODIUM 40 MG/0.4ML ~~LOC~~ SOLN
40.0000 mg | Freq: Every day | SUBCUTANEOUS | Status: DC
  Filled 2011-06-04: qty 0.4

## 2011-06-04 MED ORDER — DILTIAZEM HCL 100 MG IV SOLR: 5.0000 mg/h | Freq: Once | INTRAVENOUS | Status: DC

## 2011-06-04 MED ORDER — INSULIN ASPART 100 UNIT/ML ~~LOC~~ SOLN: 0.0000 [IU] | Freq: Every day | SUBCUTANEOUS | Status: DC

## 2011-06-04 MED ORDER — DILTIAZEM HCL 100 MG IV SOLR
5.0000 mg/h | INTRAVENOUS | Status: DC
  Administered 2011-06-04: 10 mg/h via INTRAVENOUS
  Filled 2011-06-04: qty 100

## 2011-06-04 MED ORDER — DEXTROSE 10 % IV SOLN: INTRAVENOUS | Status: DC

## 2011-06-04 MED ORDER — SODIUM CHLORIDE 0.9 % IV SOLN
INTRAVENOUS | Status: DC
  Administered 2011-06-04 – 2011-06-26 (×8): via INTRAVENOUS

## 2011-06-04 MED ORDER — FENTANYL CITRATE 0.05 MG/ML IJ SOLN
25.0000 ug | INTRAMUSCULAR | Status: DC | PRN
  Administered 2011-06-04 – 2011-06-06 (×2): 50 ug via INTRAVENOUS
  Filled 2011-06-04 (×3): qty 2

## 2011-06-04 MED ORDER — CALCITRIOL 0.5 MCG PO CAPS: 0.5000 ug | ORAL_CAPSULE | ORAL | Status: DC

## 2011-06-04 MED ORDER — ALBUTEROL SULFATE (5 MG/ML) 0.5% IN NEBU: 2.5000 mg | INHALATION_SOLUTION | Freq: Four times a day (QID) | RESPIRATORY_TRACT | Status: DC

## 2011-06-04 MED ORDER — SIMVASTATIN 40 MG PO TABS
40.0000 mg | ORAL_TABLET | Freq: Every evening | ORAL | Status: DC
  Administered 2011-06-04: 40 mg via ORAL
  Filled 2011-06-04 (×2): qty 1

## 2011-06-04 MED ORDER — CALCITRIOL 1 MCG/ML PO SOLN
0.5000 ug | ORAL | Status: DC
  Administered 2011-06-05 – 2011-06-07 (×2): 0.5 ug via ORAL
  Filled 2011-06-04 (×3): qty 0.5

## 2011-06-04 MED ORDER — HYDROCORTISONE SOD SUCCINATE 100 MG IJ SOLR
80.0000 mg | Freq: Three times a day (TID) | INTRAMUSCULAR | Status: DC
  Filled 2011-06-04 (×4): qty 1.6

## 2011-06-04 MED ORDER — DEXTROSE 5 % IV SOLN
1.0000 g | Freq: Two times a day (BID) | INTRAVENOUS | Status: DC
  Administered 2011-06-04 – 2011-06-08 (×9): 1 g via INTRAVENOUS
  Filled 2011-06-04 (×10): qty 1

## 2011-06-04 MED ORDER — PANTOPRAZOLE SODIUM 40 MG IV SOLR
40.0000 mg | INTRAVENOUS | Status: DC
  Administered 2011-06-04 – 2011-06-06 (×3): 40 mg via INTRAVENOUS
  Filled 2011-06-04 (×4): qty 40

## 2011-06-04 MED ORDER — PROPOFOL 10 MG/ML IV EMUL
5.0000 ug/kg/min | INTRAVENOUS | Status: DC
  Administered 2011-06-05: 5 ug/kg/min via INTRAVENOUS
  Filled 2011-06-04 (×2): qty 100

## 2011-06-04 MED ORDER — MIDAZOLAM HCL 2 MG/2ML IJ SOLN
INTRAMUSCULAR | Status: AC
  Administered 2011-06-04: 2 mg
  Filled 2011-06-04: qty 2

## 2011-06-04 MED ORDER — INSULIN ASPART 100 UNIT/ML ~~LOC~~ SOLN: 0.0000 [IU] | Freq: Three times a day (TID) | SUBCUTANEOUS | Status: DC

## 2011-06-04 MED ORDER — METHYLPREDNISOLONE SODIUM SUCC 40 MG IJ SOLR
40.0000 mg | Freq: Two times a day (BID) | INTRAMUSCULAR | Status: DC
  Administered 2011-06-04 – 2011-06-06 (×6): 40 mg via INTRAVENOUS
  Filled 2011-06-04 (×8): qty 1

## 2011-06-04 MED ORDER — CHLORHEXIDINE GLUCONATE 0.12 % MT SOLN
15.0000 mL | Freq: Two times a day (BID) | OROMUCOSAL | Status: DC
  Administered 2011-06-04 – 2011-06-27 (×44): 15 mL via OROMUCOSAL
  Filled 2011-06-04 (×48): qty 15

## 2011-06-04 MED ORDER — PREDNISONE 10 MG PO TABS
10.0000 mg | ORAL_TABLET | Freq: Every day | ORAL | Status: DC
  Administered 2011-06-04: 10 mg via ORAL
  Filled 2011-06-04 (×2): qty 1

## 2011-06-04 MED ORDER — CALCITRIOL 1 MCG/ML PO SOLN
1.0000 ug | ORAL | Status: DC
  Administered 2011-06-04 – 2011-06-11 (×4): 1 ug via ORAL
  Filled 2011-06-04 (×4): qty 1

## 2011-06-04 MED ORDER — FENTANYL CITRATE 0.05 MG/ML IJ SOLN
INTRAMUSCULAR | Status: AC
  Administered 2011-06-04: 50 ug
  Filled 2011-06-04: qty 2

## 2011-06-04 MED ORDER — SIROLIMUS 1 MG/ML PO SOLN
0.7500 mg | Freq: Every day | ORAL | Status: DC
  Administered 2011-06-05 – 2011-06-27 (×23): 0.75 mg via ORAL
  Filled 2011-06-04 (×26): qty 0.8

## 2011-06-04 MED ORDER — SULFAMETHOXAZOLE-TRIMETHOPRIM 400-80 MG/5ML IV SOLN
20.0000 mg/kg/d | Freq: Four times a day (QID) | INTRAVENOUS | Status: DC
  Administered 2011-06-04 – 2011-06-06 (×8): 664 mg via INTRAVENOUS
  Filled 2011-06-04 (×18): qty 41.5

## 2011-06-04 MED ORDER — VANCOMYCIN HCL 1000 MG IV SOLR
1500.0000 mg | INTRAVENOUS | Status: DC
  Administered 2011-06-04 – 2011-06-05 (×2): 1500 mg via INTRAVENOUS
  Filled 2011-06-04 (×2): qty 1500

## 2011-06-04 MED ORDER — FENTANYL CITRATE 0.05 MG/ML IJ SOLN
INTRAMUSCULAR | Status: AC
  Administered 2011-06-04: 100 ug
  Filled 2011-06-04: qty 2

## 2011-06-04 MED ORDER — HEPARIN SODIUM (PORCINE) 5000 UNIT/ML IJ SOLN
5000.0000 [IU] | Freq: Three times a day (TID) | INTRAMUSCULAR | Status: DC
  Administered 2011-06-04 – 2011-06-13 (×26): 5000 [IU] via SUBCUTANEOUS
  Filled 2011-06-04 (×31): qty 1

## 2011-06-04 MED ORDER — PROPOFOL 10 MG/ML IV EMUL
INTRAVENOUS | Status: AC
  Administered 2011-06-04: 11:00:00
  Filled 2011-06-04: qty 100

## 2011-06-04 MED ORDER — DILTIAZEM HCL ER 240 MG PO CP24
240.0000 mg | ORAL_CAPSULE | Freq: Every day | ORAL | Status: DC
  Filled 2011-06-04: qty 1

## 2011-06-04 MED ORDER — ENOXAPARIN SODIUM 30 MG/0.3ML ~~LOC~~ SOLN: 30.0000 mg | Freq: Every day | SUBCUTANEOUS | Status: DC

## 2011-06-04 MED ORDER — SODIUM CHLORIDE 0.9 % IV SOLN
100.0000 mg | Freq: Every day | INTRAVENOUS | Status: DC
  Administered 2011-06-04 – 2011-06-05 (×2): 100 mg via INTRAVENOUS
  Filled 2011-06-04 (×3): qty 100

## 2011-06-04 MED ORDER — VECURONIUM BROMIDE 10 MG IV SOLR
INTRAVENOUS | Status: AC
  Administered 2011-06-04: 8 mg
  Filled 2011-06-04: qty 10

## 2011-06-04 MED ORDER — ACETAMINOPHEN 160 MG/5ML PO SOLN: 650.0000 mg | Freq: Four times a day (QID) | ORAL | Status: DC | PRN

## 2011-06-04 MED ORDER — CALCITRIOL 0.5 MCG PO CAPS
1.0000 ug | ORAL_CAPSULE | ORAL | Status: DC
  Filled 2011-06-04: qty 2

## 2011-06-04 MED ORDER — INSULIN ASPART 100 UNIT/ML ~~LOC~~ SOLN
0.0000 [IU] | SUBCUTANEOUS | Status: DC
  Administered 2011-06-04 (×2): 1 [IU] via SUBCUTANEOUS
  Administered 2011-06-05: 3 [IU] via SUBCUTANEOUS
  Administered 2011-06-05 – 2011-06-08 (×18): 1 [IU] via SUBCUTANEOUS
  Administered 2011-06-08: 3 [IU] via SUBCUTANEOUS
  Administered 2011-06-08 (×2): 1 [IU] via SUBCUTANEOUS
  Administered 2011-06-08: 3 [IU] via SUBCUTANEOUS
  Administered 2011-06-08 – 2011-06-11 (×13): 1 [IU] via SUBCUTANEOUS
  Administered 2011-06-11: 3 [IU] via SUBCUTANEOUS
  Administered 2011-06-11 – 2011-06-12 (×8): 1 [IU] via SUBCUTANEOUS
  Administered 2011-06-12: 3 [IU] via SUBCUTANEOUS
  Administered 2011-06-13 – 2011-06-17 (×20): 1 [IU] via SUBCUTANEOUS

## 2011-06-04 MED ORDER — BIOTENE DRY MOUTH MT LIQD
15.0000 mL | Freq: Four times a day (QID) | OROMUCOSAL | Status: DC
  Administered 2011-06-05 – 2011-06-27 (×84): 15 mL via OROMUCOSAL

## 2011-06-04 MED ORDER — LEVOFLOXACIN IN D5W 750 MG/150ML IV SOLN
750.0000 mg | INTRAVENOUS | Status: DC
  Filled 2011-06-04: qty 150

## 2011-06-04 MED ORDER — LEVOFLOXACIN IN D5W 750 MG/150ML IV SOLN
750.0000 mg | Freq: Once | INTRAVENOUS | Status: AC
  Administered 2011-06-04: 750 mg via INTRAVENOUS
  Filled 2011-06-04: qty 150

## 2011-06-04 MED ORDER — TUBERCULIN PPD 5 UNIT/0.1ML ID SOLN
5.0000 [IU] | Freq: Once | INTRADERMAL | Status: AC
  Administered 2011-06-04: 5 [IU] via INTRADERMAL
  Filled 2011-06-04 (×2): qty 0.1
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
## 2011-06-04 NOTE — H&P (Signed)
Chad [name] is an 56 y.o. male.   Chief Complaint: shortness of breath HPI: A 56 year old gentleman with history of renal transplant who has been doing fine coming in with 3 days of cough, shortness of breath and fever. He has been on immunosuppressants for his renal transplant. Patient has not had any known sick contacts. He had associated nausea and vomiting but no diarrhea no constipation no bright red blood per rectum. He has bilateral lymphedema which has remained for the most part within range of what he normally has. He has some slight chest discomfort but not frank chest pain. He is having cough which is slightly productive of white sputum. In the ED patient was found to be severely hypoxic requiring nonrebreather bag. His chest x-ray showed findings consistent with a pneumonia.  Past Medical History  Diagnosis Date  . Hypertension     Past Surgical History  Procedure Date  . Nephrectomy transplanted organ     History reviewed. No pertinent family history. Social History:  reports that he has never smoked. He does not have any smokeless tobacco history on file. He reports that he does not drink alcohol. His drug history not on file.  Allergies: No Known Allergies   (Not in a hospital admission)  Results for orders placed during the hospital encounter of 06/03/11 (from the past 48 hour(s))  URINALYSIS, ROUTINE W REFLEX MICROSCOPIC     Status: Abnormal   Collection Time   06/03/11 10:53 PM      Component Value Range Comment   Color, Urine YELLOW  YELLOW     APPearance CLOUDY (*) CLEAR     Specific Gravity, Urine 1.015  1.005 - 1.030     pH 5.5  5.0 - 8.0     Glucose, UA NEGATIVE  NEGATIVE (mg/dL)    Hgb urine dipstick TRACE (*) NEGATIVE     Bilirubin Urine NEGATIVE  NEGATIVE     Ketones, ur NEGATIVE  NEGATIVE (mg/dL)    Protein, ur 811 (*) NEGATIVE (mg/dL)    Urobilinogen, UA 1.0  0.0 - 1.0 (mg/dL)    Nitrite NEGATIVE  NEGATIVE     Leukocytes, UA NEGATIVE  NEGATIVE      URINE MICROSCOPIC-ADD ON     Status: Abnormal   Collection Time   06/03/11 10:53 PM      Component Value Range Comment   RBC / HPF 0-2  <3 (RBC/hpf)    Bacteria, UA MANY (*) RARE    CBC     Status: Abnormal   Collection Time   06/03/11 10:55 PM      Component Value Range Comment   WBC 17.6 (*) 4.0 - 10.5 (K/uL)    RBC 4.64  4.22 - 5.81 (MIL/uL)    Hemoglobin 13.9  13.0 - 17.0 (g/dL)    HCT 91.4  78.2 - 95.6 (%)    MCV 90.7  78.0 - 100.0 (fL)    MCH 30.0  26.0 - 34.0 (pg)    MCHC 33.0  30.0 - 36.0 (g/dL)    RDW 21.3 (*) 08.6 - 15.5 (%)    Platelets 116 (*) 150 - 400 (K/uL)   DIFFERENTIAL     Status: Abnormal   Collection Time   06/03/11 10:55 PM      Component Value Range Comment   Neutrophils Relative 77  43 - 77 (%)    Lymphocytes Relative 12  12 - 46 (%)    Monocytes Relative 11  3 - 12 (%)  Eosinophils Relative 0  0 - 5 (%)    Basophils Relative 0  0 - 1 (%)    Neutro Abs 13.6 (*) 1.7 - 7.7 (K/uL)    Lymphs Abs 2.1  0.7 - 4.0 (K/uL)    Monocytes Absolute 1.9 (*) 0.1 - 1.0 (K/uL)    Eosinophils Absolute 0.0  0.0 - 0.7 (K/uL)    Basophils Absolute 0.0  0.0 - 0.1 (K/uL)    RBC Morphology ELLIPTOCYTES     COMPREHENSIVE METABOLIC PANEL     Status: Abnormal   Collection Time   06/03/11 10:55 PM      Component Value Range Comment   Sodium 130 (*) 135 - 145 (mEq/L)    Potassium 4.2  3.5 - 5.1 (mEq/L)    Chloride 91 (*) 96 - 112 (mEq/L)    CO2 24  19 - 32 (mEq/L)    Glucose, Bld 122 (*) 70 - 99 (mg/dL)    BUN 64 (*) 6 - 23 (mg/dL)    Creatinine, Ser 7.82 (*) 0.50 - 1.35 (mg/dL)    Calcium 9.4  8.4 - 10.5 (mg/dL)    Total Protein 7.5  6.0 - 8.3 (g/dL)    Albumin 3.2 (*) 3.5 - 5.2 (g/dL)    AST 55 (*) 0 - 37 (U/L)    ALT 26  0 - 53 (U/L)    Alkaline Phosphatase 53  39 - 117 (U/L)    Total Bilirubin 1.0  0.3 - 1.2 (mg/dL)    GFR calc non Af Amer 33 (*) >90 (mL/min)    GFR calc Af Amer 38 (*) >90 (mL/min)   LIPASE, BLOOD     Status: Normal   Collection Time   06/03/11 10:55 PM       Component Value Range Comment   Lipase 55  11 - 59 (U/L)   LACTIC ACID, PLASMA     Status: Normal   Collection Time   06/03/11 11:26 PM      Component Value Range Comment   Lactic Acid, Venous 1.7  0.5 - 2.2 (mmol/L)   PROCALCITONIN     Status: Normal   Collection Time   06/03/11 11:26 PM      Component Value Range Comment   Procalcitonin 2.81     CARDIAC PANEL(CRET KIN+CKTOT+MB+TROPI)     Status: Normal   Collection Time   06/03/11 11:26 PM      Component Value Range Comment   Total CK 185  7 - 232 (U/L)    CK, MB 1.4  0.3 - 4.0 (ng/mL)    Troponin I <0.30  <0.30 (ng/mL)    Relative Index 0.8  0.0 - 2.5    PRO B NATRIURETIC PEPTIDE     Status: Abnormal   Collection Time   06/03/11 11:26 PM      Component Value Range Comment   Pro B Natriuretic peptide (BNP) 36868.0 (*) 0 - 125 (pg/mL)    Dg Chest 2 View  06/03/2011  *RADIOLOGY REPORT*  Clinical Data: Shortness of breath with cough and fever.  CHEST - 2 VIEW  Comparison: 04/11/2009  Findings: The cardiopericardial silhouette is enlarged. Interstitial markings are diffusely coarsened with chronic features.  Subtle alveolar opacities seen in the lung bases. Interstitial markings are diffusely coarsened with chronic features. Bones are diffusely demineralized. Telemetry leads overlie the chest.  IMPRESSION: Cardiomegaly with bibasilar airspace disease suggesting pneumonia.  Original Report Authenticated By: ERIC A. MANSELL, M.D.    Review of Systems  Constitutional: Positive  for fever, chills and diaphoresis.  HENT: Positive for congestion. Negative for sore throat.   Eyes: Negative.   Respiratory: Positive for cough, sputum production and shortness of breath. Negative for hemoptysis and wheezing.   Cardiovascular: Positive for palpitations and leg swelling.  Gastrointestinal: Positive for nausea and vomiting. Negative for abdominal pain, diarrhea, constipation, blood in stool and melena.  Genitourinary: Negative.   Musculoskeletal:  Negative.   Skin: Negative.   Neurological: Negative.   Endo/Heme/Allergies: Negative.   Psychiatric/Behavioral: Negative.     Blood pressure 118/87, pulse 126, temperature 99.5 F (37.5 C), temperature source Oral, resp. rate 26, SpO2 97.00%. Physical Exam  Constitutional: He is oriented to person, place, and time. He appears well-developed and well-nourished.  HENT:  Head: Normocephalic and atraumatic.  Right Ear: External ear normal.  Left Ear: External ear normal.  Nose: Nose normal.  Mouth/Throat: Oropharynx is clear and moist.  Eyes: Conjunctivae and EOM are normal. Pupils are equal, round, and reactive to light.  Neck: Normal range of motion. Neck supple.  Cardiovascular: Regular rhythm.  Tachycardia present.   Respiratory: He is in respiratory distress. He has wheezes. He has rales. He exhibits no tenderness.  GI: Soft. Bowel sounds are normal.  Musculoskeletal: He exhibits edema.  Neurological: He is alert and oriented to person, place, and time. He has normal reflexes.  Skin: Skin is warm and dry.  Psychiatric: He has a normal mood and affect. His behavior is normal. Judgment and thought content normal.     Assessment/Plan Assessment this is a 56 year old gentleman presenting with sepsis-like syndrome with concomitant pneumonia as well as dehydration, acute on chronic kidney failure and bilateral lymphedema. More than likely patient has had status associated pneumonia with his immunocompromised status. Plan #1 healthcare associated pneumonia: We'll admit the patient and put him on cefepime vancomycin as well as Levaquin. He will need to be on stepdown unit due to the signs of sepsis. I will put him on oxygen by nonrebreather bag and gradually titrate him down to nasal cannula. #2 sepsis syndrome: More likely secondary to pneumonia. Again most admit patient to step down unit for close monitoring and continue IV antibiotics. #3 acute on chronic kidney disease: Patient is  status post renal transplant and is being followed by nephrology. We will monitor his kidney function here consult nephrology and give him IV fluids gently.  #4 thrombocytopenia: Patient looks platelets will be closely monitored at this point no evidence of bleed. We will be careful with heparin products in case he has heparin-induced thrombocytopenia #5 lymphedema: Patient has chronic lymphedema which according to him is close to his baseline at the moment. We will therefore continue with the Demadex mainly and elevate his feet as necessary #6 dehydration: We will cautiously hydrate the patient. His pro BNP is markedly elevated but this could be secondary to his renal status. #7 hyponatremia: More likely secondary to dehydration again we will give him saline cautiously  Dolton Shaker,LAWAL 06/04/2011, 2:54 AM

## 2011-06-04 NOTE — Progress Notes (Signed)
Patient electronic medical record reviewed and patient examined by a.m. this morning. Patient was found to have progressive hypoxemic respiratory failure and was now having blood tinged productive sputum despite treatment he was also found to have mild hypercarbia despite persistent tachypnea and respiratory rate between 35 and 40. In addition despite IV Cardizem infusion patient had persistent tachycardia. His overall status looked quite tenuous. He has a history of renal transplant at [facility] in 1996 and is on chronic prednisone and other immunosuppressive medications. Because of his tachycardia and relative hypotension the previously ordered prednisone was changed to IV Solu-Cortef i.e. stress dose steroids. His admission he was placed on broad-spectrum antibiotic coverage. At presentation his Procalcitonin and lactic acid level were elevated although his creatinine was only slightly higher than the baseline of 1.9. His chest x-ray compatible with a mixed pattern of pneumonia as well as edema possibly noncardiac such as ARDS. His BNP was markedly elevated at admission at 36,868. Because of his tenuous status the pulmonary critical care medicine team was asked to evaluate the patient. Subsequently the patient decompensated and required intubation, bronchoscopy, and central line placement. Subsequently care has been sent by the pulmonary critical care medicine team

## 2011-06-04 NOTE — ED Provider Notes (Signed)
History     CSN: [id number]  Arrival date & time 06/03/11  2231   First MD Initiated Contact with Patient 06/03/11 2316      Chief Complaint  Patient presents with  . Influenza    (Consider location/radiation/quality/duration/timing/severity/associated sxs/prior treatment) HPI A LEVEL 5 CAVEAT PERTAINS DUE TO URGENT NEED FOR INTERVENTION Pt presents with c/o feeling like he has the flu.  Pt states he has had fever/chills x 3 days with diffuse body aching, lower abdominal pain, cough, difficulty breathing, fatigue.  He reports lower extremity swelling but this has actually improved as he has been lying in bed with feet up for past 2 days.  Pt states movement and exertion make his symptoms worse. He has no known sick contacts.  He has a hx of renal transplant in 1990s- reports no known complications of this.  Taking rapamune.   Past Medical History  Diagnosis Date  . Hypertension   . H/O kidney transplant     Past Surgical History  Procedure Date  . Nephrectomy transplanted organ     History reviewed. No pertinent family history.  History  Substance Use Topics  . Smoking status: Never Smoker   . Smokeless tobacco: Not on file  . Alcohol Use: No      Review of Systems UNABLE TO OBTAIN ROS DUE TO LEVEL 5 CAVEAT  Allergies  Review of patient's allergies indicates no known allergies.  Home Medications   No current outpatient prescriptions on file.  BP 106/83  Pulse 125  Temp(Src) 98.6 F (37 C) (Oral)  Resp 33  Ht 6\' 1"  (1.854 m)  Wt 292 lb 8.8 oz (132.7 kg)  BMI 38.60 kg/m2  SpO2 98% Vitals reviewed Physical Exam Physical Examination: General appearance - alert, ill appearing, and in mild distress Mental status - alert, oriented to person, place, and time Eyes - pupils equal and reactive, no conjunctival injection no scleral icterus Mouth - mucous membranes moist, pharynx normal without lesions Chest - decreased breath sounds throughout, tachypneic,  speaking in 2-3 word sentences, no wheezing or rales Heart - tachycardia, regular rhythm, normal S1, S2, no murmurs, rubs, clicks or gallops Abdomen - soft, ttp in suprapubic region, nondistended, no masses or organomegaly, nabs Musculoskeletal - no joint tenderness, deformity or swelling Extremities - peripheral pulses normal, 3+pitting pedal edema/symmetric, no clubbing or cyanosis Skin - normal coloration and turgor, no rashes  ED Course  Procedures (including critical care time)   Date: 06/04/2011  Rate: atrial fibrillation with RVR vs aflutter with variable block  Rhythm: 170  QRS Axis: right  Intervals: indeterminate  ST/T Wave abnormalities: ST depressions inferiorly  Conduction Disutrbances:none  Narrative Interpretation:   Old EKG Reviewed: since prior ekg of 04/11/09 rate has increased and irregular, PVCs no longer present  CRITICAL CARE Performed by: Ethelda Chick   Total critical care time: 45  Critical care time was exclusive of separately billable procedures and treating other patients.  Critical care was necessary to treat or prevent imminent or life-threatening deterioration.  Critical care was time spent personally by me on the following activities: development of treatment plan with patient and/or surrogate as well as nursing, discussions with consultants, evaluation of patient's response to treatment, examination of patient, obtaining history from patient or surrogate, ordering and performing treatments and interventions, ordering and review of laboratory studies, ordering and review of radiographic studies, pulse oximetry and re-evaluation of patient's condition.  1:46 AM  D/w Dr. Mikeal Hawthorne, he requests admission to stepdown, Team 1  and he will see patient in the ED for evaluation.    Labs Reviewed  URINALYSIS, ROUTINE W REFLEX MICROSCOPIC - Abnormal; Notable for the following:    APPearance CLOUDY (*)    Hgb urine dipstick TRACE (*)    Protein, ur 100 (*)     All other components within normal limits  CBC - Abnormal; Notable for the following:    WBC 17.6 (*)    RDW 16.9 (*)    Platelets 116 (*)    All other components within normal limits  DIFFERENTIAL - Abnormal; Notable for the following:    Neutro Abs 13.6 (*)    Monocytes Absolute 1.9 (*)    All other components within normal limits  COMPREHENSIVE METABOLIC PANEL - Abnormal; Notable for the following:    Sodium 130 (*)    Chloride 91 (*)    Glucose, Bld 122 (*)    BUN 64 (*)    Creatinine, Ser 2.16 (*)    Albumin 3.2 (*)    AST 55 (*)    GFR calc non Af Amer 33 (*)    GFR calc Af Amer 38 (*)    All other components within normal limits  URINE MICROSCOPIC-ADD ON - Abnormal; Notable for the following:    Bacteria, UA MANY (*)    All other components within normal limits  PRO B NATRIURETIC PEPTIDE - Abnormal; Notable for the following:    Pro B Natriuretic peptide (BNP) 16109.6 (*)    All other components within normal limits  COMPREHENSIVE METABOLIC PANEL - Abnormal; Notable for the following:    Sodium 134 (*)    Chloride 95 (*)    BUN 64 (*)    Creatinine, Ser 2.19 (*)    Albumin 2.9 (*)    AST 49 (*)    GFR calc non Af Amer 32 (*)    GFR calc Af Amer 37 (*)    All other components within normal limits  CBC - Abnormal; Notable for the following:    WBC 15.8 (*)    RDW 16.9 (*)    Platelets 112 (*)    All other components within normal limits  DIFFERENTIAL - Abnormal; Notable for the following:    Lymphocytes Relative 11 (*)    Monocytes Relative 15 (*)    Neutro Abs 11.7 (*)    Monocytes Absolute 2.4 (*)    All other components within normal limits  POCT I-STAT 3, BLOOD GAS (G3+) - Abnormal; Notable for the following:    pCO2 arterial 48.8 (*)    pO2, Arterial 311.0 (*)    Bicarbonate 30.3 (*)    Acid-Base Excess 4.0 (*)    All other components within normal limits  LIPASE, BLOOD  LACTIC ACID, PLASMA  PROCALCITONIN  CARDIAC PANEL(CRET KIN+CKTOT+MB+TROPI)    MRSA PCR SCREENING  GLUCOSE, CAPILLARY  STREP PNEUMONIAE URINARY ANTIGEN  URINE CULTURE  CULTURE, BLOOD (ROUTINE X 2)  CULTURE, BLOOD (ROUTINE X 2)  HIV ANTIBODY (ROUTINE TESTING)  CULTURE, EXPECTORATED SPUTUM-ASSESSMENT  GRAM STAIN  LEGIONELLA ANTIGEN, URINE  RESPIRATORY VIRUS PANEL (18 COMPONENTS)   Dg Chest 2 View  06/03/2011  *RADIOLOGY REPORT*  Clinical Data: Shortness of breath with cough and fever.  CHEST - 2 VIEW  Comparison: 04/11/2009  Findings: The cardiopericardial silhouette is enlarged. Interstitial markings are diffusely coarsened with chronic features.  Subtle alveolar opacities seen in the lung bases. Interstitial markings are diffusely coarsened with chronic features. Bones are diffusely demineralized. Telemetry leads overlie the chest.  IMPRESSION: Cardiomegaly with bibasilar airspace disease suggesting pneumonia.  Original Report Authenticated By: ERIC A. MANSELL, M.D.     1. Healthcare-associated pneumonia   2. Renal insufficiency   3. Atrial flutter with rapid ventricular response   4. CHF (congestive heart failure)   5. Leukocytosis   6. Hypoxia       MDM  Pt presents with c/o fever, difficulty breathing, diffuse body aches.  Pt is immunosuppressed after renal transplant.  Pt placed on monitor, IV access obtained, O2 given. CXR shows bibasilar pneumonia.  After blood and urine cultures obtained pt started on broad spectrum antibitoics.  Lactate normal, procalcitonin somewhat elevated, pt started on diltiazem drip due to afib with rapid ventricular response, maintaining blood pressure.  Pt admitted to triad for further management and evaluation.          Ethelda Chick, MD 06/04/11 [phone]

## 2011-06-04 NOTE — Progress Notes (Signed)
ANTIBIOTIC CONSULT NOTE - INITIAL  Pharmacy Consult for Micafungin, Bactrim Indication: r/o sepsis, immunocompromised, ?PCP  No Known Allergies  Patient Measurements: Height: 5\' 11"  (180.3 cm) Weight: 292 lb 8.8 oz (132.7 kg) IBW/kg (Calculated) : 75.3   Vital Signs: Temp: 101.5 F (38.6 C) (05/08 1300) Temp src: Oral (05/08 0317) BP: 107/75 mmHg (05/08 1300) Pulse Rate: 131  (05/08 1300) Intake/Output from previous day: 05/07 0701 - 05/08 0700 In: 260 [I.V.:60; IV Piggyback:200] Out: 950 [Urine:950] Intake/Output from this shift: Total I/O In: 560 [I.V.:60; IV Piggyback:500] Out: -   Labs:  Basename 06/04/11 0505 06/03/11 2255  WBC 15.8* 17.6*  HGB 13.3 13.9  PLT 112* 116*  LABCREA -- --  CREATININE 2.19* 2.16*   Estimated Creatinine Clearance: 53.6 ml/min (by C-G formula based on Cr of 2.19).  Microbiology: Recent Results (from the past 720 hour(s))  MRSA PCR SCREENING     Status: Normal   Collection Time   06/04/11  3:08 AM      Component Value Range Status Comment   MRSA by PCR NEGATIVE  NEGATIVE  Final   CULTURE, EXPECTORATED SPUTUM-ASSESSMENT     Status: Normal   Collection Time   06/04/11  7:48 AM      Component Value Range Status Comment   Specimen Description SPUTUM   Final    Special Requests NONE   Final    Sputum evaluation     Final    Value: MICROSCOPIC FINDINGS SUGGEST THAT THIS SPECIMEN IS NOT REPRESENTATIVE OF LOWER RESPIRATORY SECRETIONS. PLEASE RECOLLECT.     CALLED TO D ORTIZ,RN 06/04/11 0911 BY K SCHULTZ   Report Status 06/04/2011 FINAL   Final     Medical History: Past Medical History  Diagnosis Date  . Hypertension   . H/O kidney transplant     Assessment: 56 yo M emergently intubated and bronchoscopy performed for respiratory failure.  Was on HCAP antibiotics, now to begin micafungin and Bactrim for empiric fungal and PCP treatment.  History of renal transplant, seems to be near baseline renal function.  Goal of Therapy:    resolution of infection  Plan:  1.  Micafungin 100 mg IV q 24hr 2.  Bactrim 20 mg/kg/day TMP IV divided q 6hr  3.  F/up cultures, renal function, transition of bactrim to po, vancomycin trough at [facility], Pharm.D., BCPS Clinical Pharmacist Pager: [phone] 06/04/2011,2:44 PM

## 2011-06-04 NOTE — ED Notes (Signed)
Pt resting quietly with eyes closed, INAD, family at bedside. Pt has no decrease in status change, will continue to monitor pt.

## 2011-06-04 NOTE — Progress Notes (Signed)
Pt working very hard to breath, restless, and attempting to sit on side of bed, HR 170s/180s. Notified Dr. Tyson Alias immediately. Pt intubated. CVC placed (later verified by Dr. Tyson Alias before using.). Arterial line placed. Bronchoscopy performed. No complications. Pt belongings (Yellow colored necklace and black cell phone) sent home with pt's son. MD updated family.

## 2011-06-04 NOTE — Consult Note (Signed)
Date of Admission:  06/03/2011  Date of Consult:  06/04/2011  Reason for Consult: Sepsis, Pneumonia Referring Physician: Brooks Sailors  Impression/Recommendation Pneumonia, sepsis  Urine strep/legionella (-) Immunosuppresion Hepatitis C? Would- cover him broadly as you have.  Await stains and Cx from BAL. Viral respiratory panel pending.  Send fungal panel Check his Hep C studies.  Comment- relatively rapid onset of overwhelming pneumonia. He is at risk for a broad range of bacterial, mycobterial, fungal, viral (although his pustulent sputum on bronch is not c/i this).  Thank you for this fascinating consult.   Chad [name] is an 56 y.o. male.  HPI: 56 yo M with hx of renal txp (1996, on prednisone, sirolimus ), adm on 5-8 with 3 days of sob, cough (prod white sputum), fever. In ED he was found to be hypoxic and felt to have pneumonia on CXR (Cardiomegaly with bibasilar airspace disease suggesting pneumonia).  His WBC was 17.6 and his BNP was 36,868. He was treated as sepsis and pneumonia (started on levaquin, cefepime, vanco and stress dose steroids). Over the next12 hours he required intubation. He had BAL at that time as well (thick orange/white/bloody secretions). Bactrim and mycafungin were added to his therapies today. Seen by sister yesterday- had mild cough and was at his baseline. Was fatigued.    Past Medical History  Diagnosis Date  . Hypertension   . H/O kidney transplant     Past Surgical History  Procedure Date  . Nephrectomy transplanted organ   ergies:   No Known Allergies  Medications:  Scheduled:   . acetaminophen  650 mg Oral Once  . calcitRIOL  0.5 mcg Oral Q T,Th,S,Su  . calcitRIOL  1 mcg Oral Q M,W,F  . ceFEPime (MAXIPIME) IV  1 g Intravenous Q12H  . diltiazem (CARDIZEM) infusion  5-15 mg/hr Intravenous Once  . fentaNYL      . fentaNYL      . heparin subcutaneous  5,000 Units Subcutaneous Q8H  . insulin aspart  0-5 Units Subcutaneous QHS  . insulin  aspart  0-9 Units Subcutaneous TID WC  . levofloxacin (LEVAQUIN) IV  750 mg Intravenous Q48H  . levofloxacin (LEVAQUIN) IV  750 mg Intravenous Once  . methylPREDNISolone (SOLU-MEDROL) injection  40 mg Intravenous Q12H  . micafungin (MYCAMINE) IV  100 mg Intravenous Daily  . midazolam      . piperacillin-tazobactam (ZOSYN)  IV  3.375 g Intravenous Once  . propofol      . simvastatin  40 mg Oral QPM  . sirolimus  0.75 mg Oral Daily  . sodium chloride  500 mL Intravenous Once  . sulfamethoxazole-trimethoprim  20 mg/kg/day Intravenous Q6H  . tuberculin  5 Units Intradermal Once  . vancomycin  1,500 mg Intravenous Q24H  . vancomycin  1,000 mg Intravenous Once  . vecuronium      . DISCONTD: albuterol  2.5 mg Nebulization Q6H  . DISCONTD: calcitRIOL  0.5 mcg Oral Q T,Th,S,Su  . DISCONTD: calcitRIOL  1 mcg Oral Q M,W,F  . DISCONTD: diltiazem (CARDIZEM) infusion  5-15 mg/hr Intravenous Once  . DISCONTD: diltiazem  240 mg Oral Daily  . DISCONTD: enoxaparin  30 mg Subcutaneous Daily  . DISCONTD: enoxaparin (LOVENOX) injection  40 mg Subcutaneous Daily  . DISCONTD: hydrocortisone sod succinate (SOLU-CORTEF) injection  80 mg Intravenous Q8H  . DISCONTD: predniSONE  10 mg Oral Q breakfast    Social History:  reports that he has never smoked. He does not have any smokeless tobacco history on  file. He reports that he does not drink alcohol. His drug history not on file.  History reviewed. No pertinent family history.  General ROS: always lived in [place]. Wife died of ALS 8 years ago. No known TB or sick exposures. See HPI. O/w unobtainable as pt on vent.   Blood pressure 101/83, pulse 42, temperature 101.5 F (38.6 C), temperature source Core (Comment), resp. rate 25, height 5\' 11"  (1.803 m), weight 132.7 kg (292 lb 8.8 oz), SpO2 97.00%. General appearance: no distress and intubated, sedated Eyes: negative findings: pupils= Lungs: diminished breath sounds bilaterally and rhonchi bilaterally Heart:  tachycardic Abdomen: normal findings: soft, non-tender and abnormal findings:  distended and hypoactive bowel sounds Extremities: massive LE edema/elephantiasis bilaterally   Results for orders placed during the hospital encounter of 06/03/11 (from the past 48 hour(s))  URINALYSIS, ROUTINE W REFLEX MICROSCOPIC     Status: Abnormal   Collection Time   06/03/11 10:53 PM      Component Value Range Comment   Color, Urine YELLOW  YELLOW     APPearance CLOUDY (*) CLEAR     Specific Gravity, Urine 1.015  1.005 - 1.030     pH 5.5  5.0 - 8.0     Glucose, UA NEGATIVE  NEGATIVE (mg/dL)    Hgb urine dipstick TRACE (*) NEGATIVE     Bilirubin Urine NEGATIVE  NEGATIVE     Ketones, ur NEGATIVE  NEGATIVE (mg/dL)    Protein, ur 993 (*) NEGATIVE (mg/dL)    Urobilinogen, UA 1.0  0.0 - 1.0 (mg/dL)    Nitrite NEGATIVE  NEGATIVE     Leukocytes, UA NEGATIVE  NEGATIVE    URINE MICROSCOPIC-ADD ON     Status: Abnormal   Collection Time   06/03/11 10:53 PM      Component Value Range Comment   RBC / HPF 0-2  <3 (RBC/hpf)    Bacteria, UA MANY (*) RARE    CBC     Status: Abnormal   Collection Time   06/03/11 10:55 PM      Component Value Range Comment   WBC 17.6 (*) 4.0 - 10.5 (K/uL)    RBC 4.64  4.22 - 5.81 (MIL/uL)    Hemoglobin 13.9  13.0 - 17.0 (g/dL)    HCT 71.6  96.7 - 89.3 (%)    MCV 90.7  78.0 - 100.0 (fL)    MCH 30.0  26.0 - 34.0 (pg)    MCHC 33.0  30.0 - 36.0 (g/dL)    RDW 81.0 (*) 17.5 - 15.5 (%)    Platelets 116 (*) 150 - 400 (K/uL)   DIFFERENTIAL     Status: Abnormal   Collection Time   06/03/11 10:55 PM      Component Value Range Comment   Neutrophils Relative 77  43 - 77 (%)    Lymphocytes Relative 12  12 - 46 (%)    Monocytes Relative 11  3 - 12 (%)    Eosinophils Relative 0  0 - 5 (%)    Basophils Relative 0  0 - 1 (%)    Neutro Abs 13.6 (*) 1.7 - 7.7 (K/uL)    Lymphs Abs 2.1  0.7 - 4.0 (K/uL)    Monocytes Absolute 1.9 (*) 0.1 - 1.0 (K/uL)    Eosinophils Absolute 0.0  0.0 - 0.7 (K/uL)     Basophils Absolute 0.0  0.0 - 0.1 (K/uL)    RBC Morphology ELLIPTOCYTES     COMPREHENSIVE METABOLIC PANEL  Status: Abnormal   Collection Time   06/03/11 10:55 PM      Component Value Range Comment   Sodium 130 (*) 135 - 145 (mEq/L)    Potassium 4.2  3.5 - 5.1 (mEq/L)    Chloride 91 (*) 96 - 112 (mEq/L)    CO2 24  19 - 32 (mEq/L)    Glucose, Bld 122 (*) 70 - 99 (mg/dL)    BUN 64 (*) 6 - 23 (mg/dL)    Creatinine, Ser 4.09 (*) 0.50 - 1.35 (mg/dL)    Calcium 9.4  8.4 - 10.5 (mg/dL)    Total Protein 7.5  6.0 - 8.3 (g/dL)    Albumin 3.2 (*) 3.5 - 5.2 (g/dL)    AST 55 (*) 0 - 37 (U/L)    ALT 26  0 - 53 (U/L)    Alkaline Phosphatase 53  39 - 117 (U/L)    Total Bilirubin 1.0  0.3 - 1.2 (mg/dL)    GFR calc non Af Amer 33 (*) >90 (mL/min)    GFR calc Af Amer 38 (*) >90 (mL/min)   LIPASE, BLOOD     Status: Normal   Collection Time   06/03/11 10:55 PM      Component Value Range Comment   Lipase 55  11 - 59 (U/L)   LACTIC ACID, PLASMA     Status: Normal   Collection Time   06/03/11 11:26 PM      Component Value Range Comment   Lactic Acid, Venous 1.7  0.5 - 2.2 (mmol/L)   PROCALCITONIN     Status: Normal   Collection Time   06/03/11 11:26 PM      Component Value Range Comment   Procalcitonin 2.81     CARDIAC PANEL(CRET KIN+CKTOT+MB+TROPI)     Status: Normal   Collection Time   06/03/11 11:26 PM      Component Value Range Comment   Total CK 185  7 - 232 (U/L)    CK, MB 1.4  0.3 - 4.0 (ng/mL)    Troponin I <0.30  <0.30 (ng/mL)    Relative Index 0.8  0.0 - 2.5    PRO B NATRIURETIC PEPTIDE     Status: Abnormal   Collection Time   06/03/11 11:26 PM      Component Value Range Comment   Pro B Natriuretic peptide (BNP) 36868.0 (*) 0 - 125 (pg/mL)   MRSA PCR SCREENING     Status: Normal   Collection Time   06/04/11  3:08 AM      Component Value Range Comment   MRSA by PCR NEGATIVE  NEGATIVE    GLUCOSE, CAPILLARY     Status: Normal   Collection Time   06/04/11  3:16 AM      Component Value  Range Comment   Glucose-Capillary 96  70 - 99 (mg/dL)   POCT I-STAT 3, BLOOD GAS (G3+)     Status: Abnormal   Collection Time   06/04/11  4:03 AM      Component Value Range Comment   pH, Arterial 7.402  7.350 - 7.450     pCO2 arterial 48.8 (*) 35.0 - 45.0 (mmHg)    pO2, Arterial 311.0 (*) 80.0 - 100.0 (mmHg)    Bicarbonate 30.3 (*) 20.0 - 24.0 (mEq/L)    TCO2 32  0 - 100 (mmol/L)    O2 Saturation 100.0      Acid-Base Excess 4.0 (*) 0.0 - 2.0 (mmol/L)    Patient temperature 98.6 F  Collection site RADIAL, ALLEN'S TEST ACCEPTABLE      Drawn by Operator      Sample type ARTERIAL     LEGIONELLA ANTIGEN, URINE     Status: Normal   Collection Time   06/04/11  4:33 AM      Component Value Range Comment   Specimen Description URINE, CATHETERIZED      Special Requests NONE      Legionella Antigen, Urine Negative for Legionella pneumophilia serogroup 1      Report Status 06/04/2011 FINAL     STREP PNEUMONIAE URINARY ANTIGEN     Status: Normal   Collection Time   06/04/11  4:33 AM      Component Value Range Comment   Strep Pneumo Urinary Antigen NEGATIVE  NEGATIVE    COMPREHENSIVE METABOLIC PANEL     Status: Abnormal   Collection Time   06/04/11  5:05 AM      Component Value Range Comment   Sodium 134 (*) 135 - 145 (mEq/L)    Potassium 4.0  3.5 - 5.1 (mEq/L)    Chloride 95 (*) 96 - 112 (mEq/L)    CO2 28  19 - 32 (mEq/L)    Glucose, Bld 99  70 - 99 (mg/dL)    BUN 64 (*) 6 - 23 (mg/dL)    Creatinine, Ser 4.09 (*) 0.50 - 1.35 (mg/dL)    Calcium 9.1  8.4 - 10.5 (mg/dL)    Total Protein 6.9  6.0 - 8.3 (g/dL)    Albumin 2.9 (*) 3.5 - 5.2 (g/dL)    AST 49 (*) 0 - 37 (U/L)    ALT 23  0 - 53 (U/L)    Alkaline Phosphatase 47  39 - 117 (U/L)    Total Bilirubin 1.0  0.3 - 1.2 (mg/dL)    GFR calc non Af Amer 32 (*) >90 (mL/min)    GFR calc Af Amer 37 (*) >90 (mL/min)   CBC     Status: Abnormal   Collection Time   06/04/11  5:05 AM      Component Value Range Comment   WBC 15.8 (*) 4.0 - 10.5  (K/uL)    RBC 4.48  4.22 - 5.81 (MIL/uL)    Hemoglobin 13.3  13.0 - 17.0 (g/dL)    HCT 81.1  91.4 - 78.2 (%)    MCV 90.8  78.0 - 100.0 (fL)    MCH 29.7  26.0 - 34.0 (pg)    MCHC 32.7  30.0 - 36.0 (g/dL)    RDW 95.6 (*) 21.3 - 15.5 (%)    Platelets 112 (*) 150 - 400 (K/uL)   DIFFERENTIAL     Status: Abnormal   Collection Time   06/04/11  5:05 AM      Component Value Range Comment   Neutrophils Relative 74  43 - 77 (%)    Lymphocytes Relative 11 (*) 12 - 46 (%)    Monocytes Relative 15 (*) 3 - 12 (%)    Eosinophils Relative 0  0 - 5 (%)    Basophils Relative 0  0 - 1 (%)    Neutro Abs 11.7 (*) 1.7 - 7.7 (K/uL)    Lymphs Abs 1.7  0.7 - 4.0 (K/uL)    Monocytes Absolute 2.4 (*) 0.1 - 1.0 (K/uL)    Eosinophils Absolute 0.0  0.0 - 0.7 (K/uL)    Basophils Absolute 0.0  0.0 - 0.1 (K/uL)    RBC Morphology POLYCHROMASIA PRESENT     CULTURE, EXPECTORATED SPUTUM-ASSESSMENT  Status: Normal   Collection Time   06/04/11  7:48 AM      Component Value Range Comment   Specimen Description SPUTUM      Special Requests NONE      Sputum evaluation        Value: MICROSCOPIC FINDINGS SUGGEST THAT THIS SPECIMEN IS NOT REPRESENTATIVE OF LOWER RESPIRATORY SECRETIONS. PLEASE RECOLLECT.     CALLED TO D ORTIZ,RN 06/04/11 0911 BY K SCHULTZ   Report Status 06/04/2011 FINAL     POCT I-STAT 3, BLOOD GAS (G3+)     Status: Abnormal   Collection Time   06/04/11 10:41 AM      Component Value Range Comment   pH, Arterial 7.394  7.350 - 7.450     pCO2 arterial 43.8  35.0 - 45.0 (mmHg)    pO2, Arterial 66.0 (*) 80.0 - 100.0 (mmHg)    Bicarbonate 26.7 (*) 20.0 - 24.0 (mEq/L)    TCO2 28  0 - 100 (mmol/L)    O2 Saturation 93.0      Acid-Base Excess 1.0  0.0 - 2.0 (mmol/L)    Patient temperature 98.6 F      Collection site BRACHIAL ARTERY      Drawn by RT      Sample type ARTERIAL     PROCALCITONIN     Status: Normal   Collection Time   06/04/11 11:00 AM      Component Value Range Comment   Procalcitonin 4.74       STREP PNEUMONIAE URINARY ANTIGEN     Status: Normal   Collection Time   06/04/11 12:00 PM      Component Value Range Comment   Strep Pneumo Urinary Antigen NEGATIVE  NEGATIVE    POCT I-STAT 3, BLOOD GAS (G3+)     Status: Abnormal   Collection Time   06/04/11 12:14 PM      Component Value Range Comment   pH, Arterial 7.467 (*) 7.350 - 7.450     pCO2 arterial 36.6  35.0 - 45.0 (mmHg)    pO2, Arterial 82.0  80.0 - 100.0 (mmHg)    Bicarbonate 26.4 (*) 20.0 - 24.0 (mEq/L)    TCO2 28  0 - 100 (mmol/L)    O2 Saturation 97.0      Acid-Base Excess 3.0 (*) 0.0 - 2.0 (mmol/L)    Patient temperature 98.6 F      Collection site ARTERIAL LINE      Drawn by Nurse      Sample type ARTERIAL     GLUCOSE, CAPILLARY     Status: Abnormal   Collection Time   06/04/11 12:17 PM      Component Value Range Comment   Glucose-Capillary 105 (*) 70 - 99 (mg/dL)   POCT I-STAT 3, BLOOD GAS (G3+)     Status: Abnormal   Collection Time   06/04/11  1:31 PM      Component Value Range Comment   pH, Arterial 7.426  7.350 - 7.450     pCO2 arterial 40.6  35.0 - 45.0 (mmHg)    pO2, Arterial 124.0 (*) 80.0 - 100.0 (mmHg)    Bicarbonate 26.7 (*) 20.0 - 24.0 (mEq/L)    TCO2 28  0 - 100 (mmol/L)    O2 Saturation 99.0      Acid-Base Excess 2.0  0.0 - 2.0 (mmol/L)    Patient temperature 98.6 F      Collection site ARTERIAL LINE      Drawn by RT  Sample type ARTERIAL     CARDIAC PANEL(CRET KIN+CKTOT+MB+TROPI)     Status: Normal   Collection Time   06/04/11  2:10 PM      Component Value Range Comment   Total CK 163  7 - 232 (U/L)    CK, MB 2.1  0.3 - 4.0 (ng/mL)    Troponin I <0.30  <0.30 (ng/mL)    Relative Index 1.3  0.0 - 2.5    LACTIC ACID, PLASMA     Status: Normal   Collection Time   06/04/11  2:10 PM      Component Value Range Comment   Lactic Acid, Venous 1.0  0.5 - 2.2 (mmol/L)       Component Value Date/Time   SDES SPUTUM 06/04/2011 0748   SPECREQUEST NONE 06/04/2011 0748   CULT NO GROWTH 5 DAYS 04/07/2009  1500   REPTSTATUS 06/04/2011 FINAL 06/04/2011 0748   Dg Chest 2 View  06/03/2011  *RADIOLOGY REPORT*  Clinical Data: Shortness of breath with cough and fever.  CHEST - 2 VIEW  Comparison: 04/11/2009  Findings: The cardiopericardial silhouette is enlarged. Interstitial markings are diffusely coarsened with chronic features.  Subtle alveolar opacities seen in the lung bases. Interstitial markings are diffusely coarsened with chronic features. Bones are diffusely demineralized. Telemetry leads overlie the chest.  IMPRESSION: Cardiomegaly with bibasilar airspace disease suggesting pneumonia.  Original Report Authenticated By: ERIC A. MANSELL, M.D.   Dg Chest Port 1 View  06/04/2011  *RADIOLOGY REPORT*  Clinical Data: Left internal jugular central venous catheter placement.  Endotracheal tube repositioning.  PORTABLE CHEST - 1 VIEW/2013 1135 hours:  Comparison: Portable chest x-ray earlier same date 1030 hours.  Findings: Interval left internal jugular central venous catheter placement with its tip in the upper SVC.  No evidence of pneumothorax or mediastinal hematoma.  Endotracheal tube withdrawn slightly such that its tip is in satisfactory position approximately 4-5 cm above the carina.  Nasogastric tube courses below the diaphragm into the stomach.  Markedly suboptimal inspiration.  Cardiac silhouette enlarged but stable.  Airspace consolidation at the lung bases on the left upper lobe unchanged. Pulmonary venous hypertension, unchanged.  IMPRESSION:  1.  Left internal jugular central venous catheter tip in the upper SVC.  No acute complicating features. 2.  Endotracheal tube tip now in satisfactory position approximately 4-5 cm above the carina. 3.  Asymmetric airspace pulmonary edema versus pneumonia, left greater than right, as noted earlier.  Original Report Authenticated By: Arnell Sieving, M.D.   Dg Chest Port 1 View  06/04/2011  *RADIOLOGY REPORT*  Clinical Data: Endotracheal tube placement.  PORTABLE  CHEST - 1 VIEW  Comparison: 06/03/2010.  Findings: Endotracheal tube tip just above the carina.  This may be retracted by 2 cm to avoid mainstem bronchus intubation with change of the patient's neck position.  Nasogastric tube courses below the diaphragm.  The tip is not included on this exam.  Interval development of consolidation lung bases greater on the left and patchy opacification left upper lobe.  This may represent infectious infiltrate.  Asymmetric edema also a consideration.  Cardiomegaly.  Limit evaluation of the aorta.  IMPRESSION: Endotracheal tube tip just above the carina.  This may be retracted by 2 cm to avoid mainstem bronchus intubation with change of the patient's neck position.  Nasogastric tube courses below the diaphragm.  The tip is not included on this exam.  Interval development of consolidation lung bases greater on the left and patchy opacification left upper lobe.  This may represent infectious infiltrate.  Asymmetric edema also a consideration.  Cardiomegaly.  This has been made a PRA call report utilizing dashboard call feature.  Original Report Authenticated By: Fuller Canada, M.D.    Thank you so much for this interesting consult,   Johny Sax [phone] 06/04/2011, 3:41 PM     LOS: 1 day

## 2011-06-04 NOTE — Consult Note (Addendum)
Name: Chad [name] MRN: [id number] DOB: 09-03-1955    LOS: 1  PCCM ADMISSION NOTE  History of Present Illness: [name] s/p renal Tx, immune suppressed, with 3 days of cough, shortness of breath and fever. He has been on immunosuppressants for his renal transplant. N/Vom noted at home.Admitted to sdu, declines requiring emergent intubation, near arrest. Emergent bronch done, bloody secretions and pus throughout.  Lines / Drains: Left ij 5/8>>> ETT 5/8>>> Aline 5/8>>>  Cultures: BAL bronch 5/8>>> Bronch  afb>>>   Pcp>>>   Fungus>>> BC 5/7>>> Sputum 5/7>>>  Antibiotics: 5/7 vanc>>> 5/7 levoflox>>> 5/7 myco>>> 5/7 bactrim / steroids>>>  Tests / Events: 5/8 hypoxic resp failure, intubated, near arrest  The patient is sedated, intubated and unable to provide history, which was obtained for available medical records.    Past Medical History  Diagnosis Date  . Hypertension   . H/O kidney transplant    Past Surgical History  Procedure Date  . Nephrectomy transplanted organ    Prior to Admission medications   Medication Sig Start Date End Date Taking? Authorizing Provider  acetaminophen (TYLENOL) 500 MG tablet Take 1,000 mg by mouth every 6 (six) hours as needed. For pain.   Yes Historical Provider, MD  allopurinol (ZYLOPRIM) 100 MG tablet Take 100 mg by mouth daily.   Yes Historical Provider, MD  calcitRIOL (ROCALTROL) 0.5 MCG capsule Take 0.5-1 mcg by mouth daily. 2 tablets Monday Wednesday and Friday 1 tablet all other days   Yes Historical Provider, MD  diltiazem (DILACOR XR) 240 MG 24 hr capsule Take 240 mg by mouth daily.   Yes Historical Provider, MD  predniSONE (DELTASONE) 10 MG tablet Take 10 mg by mouth daily.   Yes Historical Provider, MD  sildenafil (VIAGRA) 50 MG tablet Take 50 mg by mouth daily as needed. For erectile dysfunction   Yes Historical Provider, MD  simvastatin (ZOCOR) 40 MG tablet Take 40 mg by mouth every evening.   Yes Historical Provider,  MD  sirolimus (RAPAMUNE) 1 MG/ML solution Take 0.75 mg by mouth daily.   Yes Historical Provider, MD  torsemide (DEMADEX) 20 MG tablet Take 20-40 mg by mouth daily. 2 tablets in the morning and 1-2 tablets in the evening   Yes Historical Provider, MD   Allergies No Known Allergies  Family History History reviewed. No pertinent family history.  Social History  reports that he has never smoked. He does not have any smokeless tobacco history on file. He reports that he does not drink alcohol. His drug history not on file.  Review Of Systems  11 points review of systems is negative with an exception of listed in HPI.  Vital Signs: Temp:  [98.6 F (37 C)-102.9 F (39.4 C)] 101.5 F (38.6 C) (05/08 1300) Pulse Rate:  [84-137] 131  (05/08 1300) Resp:  [15-35] 22  (05/08 1300) BP: (68-159)/(32-98) 107/75 mmHg (05/08 1300) SpO2:  [88 %-100 %] 97 % (05/08 1300) Arterial Line BP: (104)/(59) 104/59 mmHg (05/08 1200) FiO2 (%):  [50 %-100 %] 70 % (05/08 1220) Weight:  [132.7 kg (292 lb 8.8 oz)] 132.7 kg (292 lb 8.8 oz) (05/08 0321) I/O last 3 completed shifts: In: 260 [I.V.:60; IV Piggyback:200] Out: 950 [Urine:950]  Physical Examination: General:  Extremis, severe distress Neuro:  Nonfocal, speaking barely as resp rate 55 HEENT:  jvd mild Neck:  ett  Now wnl Cardiovascular:  ST S2 S2 rrr Lungs:  ronchi severe bilat Abdomen:  Soft, bs wnl no r/g Lower  ext- severe chronic edema  Ventilator settings: Vent Mode:  [-] PRVC FiO2 (%):  [50 %-100 %] 70 % Set Rate:  [20 bmp-22 bmp] 22 bmp Vt Set:  [450 mL-600 mL] 450 mL PEEP:  [10 cmH20] 10 cmH20 Plateau Pressure:  [22 cmH20-23 cmH20] 23 cmH20  Labs and Imaging:  Reviewed.  Please refer to the Assessment and Plan section for relevant results.  Assessment and Plan: 1. Pulmonary   ARDS PNA, immune suppressed Acute respiratory failure -required emrgent intubation -Bronch reflects pus, bloody secretions concern PNA, NOT failure -ARDs  protocol, abg , plat goal less 25-30 -abg reviewed, maintain current MV -goal to Tv 6 , likely -please note on bronch ETT gets hung p on a thick ring left and difficult to advance passed this, keep ETT at 1.5 above carina and not pull back if able to avoid  2. CVS One bout SVT  With distress pre intubation, NO fib noted Sepsis, severe -cvp, lactic acid -volume for now Trop x 1, if pos then control rate otherwise, volume -dc dilt drip  3. Renal Renal TX, ATN -volume cvp Renal called bmet in pm Saline at 100  4. ID PNA Immunocommprommsied host Empiric pcp, HCAP, fungal, atypical  coverage, see dashboard BAL bronch done see orders [facility] sent ID called  4. Heme leukocytosis -immune suppression per renal Sub q hep Note some blood on bronch, can tolerate sub q q  5. Endo cbg Steroids for pcp, at risk rel AI   Best practices / Disposition: -->ICU status under PCCM -->full code -->Heparin for DVT Px sub q -->Protonix for GI Px -->ventilator bundle -->diet -->family updated at bedside, sister updated  The patient is critically ill with multiple organ systems failure and requires high complexity decision making for assessment and support, frequent evaluation and titration of therapies, application of advanced monitoring technologies and extensive interpretation of multiple databases. Critical Care Time devoted to patient care services described in this note is 90 minutes.  [name]. 06/04/2011, 1:20 PM  Mcarthur Rossetti. Tyson Alias, MD, FACP Pgr: [phone] [facility]

## 2011-06-04 NOTE — ED Notes (Signed)
Pt remains tachycardic but denies any chest pain or shortness of breath at this time. Chuck pads place under bilateral legs due to weeping fluids from legs, pt has chronic edema to bilateral legs, pt legs are moderately swollen with drainage noted. Will continue to monitor pt, family remains at bedside. Pt is updated with further plan of care, pt will have foley catheter inserted by tech.

## 2011-06-04 NOTE — Progress Notes (Signed)
06-04-11 UR completed. Ronny Flurry RN BSN

## 2011-06-04 NOTE — Progress Notes (Signed)
Pt on ards protocol, being titrated from 70 to 60% fio2

## 2011-06-04 NOTE — Procedures (Signed)
Intubation Procedure Note Chad [name] [id number] 23-Sep-1955  Procedure: Intubation Indications: Respiratory insufficiency  Procedure Details Consent: Risks of procedure as well as the alternatives and risks of each were explained to the (patient/caregiver).  Consent for procedure obtained. Time Out: Verified patient identification, verified procedure, site/side was marked, verified correct patient position, special equipment/implants available, medications/allergies/relevent history reviewed, required imaging and test results available.  Performed  Maximum sterile technique was used including gloves, gown and hand hygiene.  MAC and 4    Evaluation Hemodynamic Status: BP stable throughout; O2 sats: stable throughout Patient's Current Condition: stable Complications: No apparent complications Patient did tolerate procedure well. Chest X-ray ordered to verify placement.  CXR: pending.   Chad [name] 06/04/2011

## 2011-06-04 NOTE — Progress Notes (Signed)
eLink Physician-Brief Progress Note Patient Name: JONAS GOH DOB: 09-20-1955 MRN: [id number]  Date of Service  06/04/2011   HPI/Events of Note   protonix 40mg  IV q24h  eICU Interventions     Intervention Category Intermediate Interventions: Best-practice therapies (e.g. DVT, beta blocker, etc.)  Blessing Zaucha 06/04/2011, 5:42 PM

## 2011-06-04 NOTE — Procedures (Signed)
Bronchoscopy Procedure Note DAESHAUN SPECHT [id number] 03/12/1955  Procedure: Bronchoscopy Indications: Obtain specimens for culture and/or other diagnostic studies  Procedure Details Consent: Risks of procedure as well as the alternatives and risks of each were explained to the (patient/caregiver).  Consent for procedure obtained. Time Out: Verified patient identification, verified procedure, site/side was marked, verified correct patient position, special equipment/implants available, medications/allergies/relevent history reviewed, required imaging and test results available.  Performed  In preparation for procedure, patient was given 100% FiO2 and bronchoscope lubricated. Sedation: prop  Airway entered and the following bronchi were examined: RUL, RML, RLL, LUL, LLL and Bronchi.   Procedures performed: Brushings performed Bronchoscope removed.  , Patient placed back on 100% FiO2 at conclusion of procedure.    Evaluation Hemodynamic Status: BP stable throughout; O2 sats: stable throughout Patient's Current Condition: stable Specimens:  Sent purulent fluid Complications: No apparent complications Patient did tolerate procedure well.   [name]. 06/04/2011   1. Diffuse orange, bloody, thick, white secretions obstructing BI, Lingula 2. Erythema throughiut airwaY And edema 3. Mild bronch trauma self resolved upper division lobes 4. BAl sent   Mcarthur Rossetti. Tyson Alias, MD, FACP Pgr: [phone] [facility]

## 2011-06-04 NOTE — Procedures (Signed)
Arterial Catheter Insertion Procedure Note Chad [name] [id number] 08-Jan-1956  Procedure: Insertion of Arterial Catheter  Indications: Blood pressure monitoring and Frequent blood sampling  Procedure Details Consent: Risks of procedure as well as the alternatives and risks of each were explained to the (patient/caregiver).  Consent for procedure obtained. Time Out: Verified patient identification, verified procedure, site/side was marked, verified correct patient position, special equipment/implants available, medications/allergies/relevent history reviewed, required imaging and test results available.  Performed  Maximum sterile technique was used including antiseptics, cap, gloves, gown, hand hygiene, mask and sheet. Skin prep: Chlorhexidine; local anesthetic administered 20 gauge catheter was inserted into right radial artery using the Seldinger technique.  Evaluation Blood flow good; BP tracing good. Complications: No apparent complications.   Emergent need Drop BP, ARDS  Chad [name]. Chad Alias, MD, FACP Pgr: [phone] [facility]

## 2011-06-04 NOTE — Progress Notes (Addendum)
  ANTIBIOTIC CONSULT NOTE - INITIAL  Pharmacy Consult for Vancomycin Indication: rule out pneumonia  No Known Allergies  Patient Measurements: Height: 6\' 1"  (185.4 cm) Weight: 292 lb 8.8 oz (132.7 kg) IBW/kg (Calculated) : 79.9   Vital Signs: Temp: 98.6 F (37 C) (05/08 0317) Temp src: Oral (05/08 0317) BP: 110/75 mmHg (05/08 0408) Pulse Rate: 116  (05/08 0408) Intake/Output from previous day: 05/07 0701 - 05/08 0700 In: 50 [IV Piggyback:50] Out: 575 [Urine:575] Intake/Output from this shift: Total I/O In: 50 [IV Piggyback:50] Out: 575 [Urine:575]  Labs:  [facility] 06/03/11 2255  WBC 17.6*  HGB 13.9  PLT 116*  LABCREA --  CREATININE 2.16*   Estimated Creatinine Clearance: 55.9 ml/min (by C-G formula based on Cr of 2.16).  Medical History: Past Medical History  Diagnosis Date  . Hypertension   H/O kidney transplant  Medications:  Prescriptions prior to admission  Medication Sig Dispense Refill  . acetaminophen (TYLENOL) 500 MG tablet Take 1,000 mg by mouth every 6 (six) hours as needed. For pain.      Marland Kitchen allopurinol (ZYLOPRIM) 100 MG tablet Take 100 mg by mouth daily.      . calcitRIOL (ROCALTROL) 0.5 MCG capsule Take 0.5-1 mcg by mouth daily. 2 tablets Monday Wednesday and Friday 1 tablet all other days      . diltiazem (DILACOR XR) 240 MG 24 hr capsule Take 240 mg by mouth daily.      . predniSONE (DELTASONE) 10 MG tablet Take 10 mg by mouth daily.      . sildenafil (VIAGRA) 50 MG tablet Take 50 mg by mouth daily as needed. For erectile dysfunction      . simvastatin (ZOCOR) 40 MG tablet Take 40 mg by mouth every evening.      . sirolimus (RAPAMUNE) 1 MG/ML solution Take 0.75 mg by mouth daily.      Marland Kitchen torsemide (DEMADEX) 20 MG tablet Take 20-40 mg by mouth daily. 2 tablets in the morning and 1-2 tablets in the evening       Assessment: 56 yo male with HCAP for empiric antibiotics.  Vancomycin 1 g IV in ED at midnight  Goal of Therapy:  Vancomycin trough  level 15-20 mcg/ml  Plan:  Vancomycin 1500 mg IV q24h  Eddie Candle 06/04/2011,4:18 AM

## 2011-06-04 NOTE — Procedures (Signed)
Central Venous Catheter Insertion Procedure Note AARION METZGAR [id number] 14-Mar-1955  Procedure: Insertion of Central Venous Catheter Indications: Assessment of intravascular volume, Drug and/or fluid administration and Frequent blood sampling  Procedure Details Consent: Risks of procedure as well as the alternatives and risks of each were explained to the (patient/caregiver).  Consent for procedure obtained.-verbally per Dr. Tyson Alias prior to intubation.  Time Out: Verified patient identification, verified procedure, site/side was marked, verified correct patient position, special equipment/implants available, medications/allergies/relevent history reviewed, required imaging and test results available.  Performed  Maximum sterile technique was used including antiseptics, cap, gloves, gown, hand hygiene, mask and sheet. Skin prep: Chlorhexidine; local anesthetic administered A antimicrobial bonded/coated triple lumen catheter was placed in the left internal jugular vein using the Seldinger technique.  Evaluation Blood flow good Complications: No apparent complications Patient did tolerate procedure well. Chest X-ray ordered to verify placement.  CXR: pending.    Procedure performed under direct supervision of Dr. Tyson Alias and with ultrasound guidance.    Canary Brim, NP-C [facility] Pgr: [phone]   toleratd well Cvp? [place], access D/w pt Mcarthur Rossetti. Tyson Alias, MD, FACP Pgr: [phone] [facility]

## 2011-06-04 NOTE — Progress Notes (Signed)
eLink Physician-Brief Progress Note Patient Name: Chad [name] DOB: 1955-05-22 MRN: [id number]  Date of Service  06/04/2011   HPI/Events of Note  Housekeeping issues of glucose, fever tylenol addressed   eICU Interventions     Intervention Category Intermediate Interventions: Other:  Zaim Nitta 06/04/2011, 5:07 PM

## 2011-06-04 NOTE — Progress Notes (Signed)
VASCULAR LAB PRELIMINARY  PRELIMINARY  PRELIMINARY  PRELIMINARY  Left lower extremity arterial duplex completed.    Preliminary report:  Bilateral:  No obvious evidence of DVT, superficial thrombosis, or Baker's Cyst.   Dasan Hardman D, RVS 06/04/2011, 3:04 PM

## 2011-06-05 ENCOUNTER — Inpatient Hospital Stay (HOSPITAL_COMMUNITY): Payer: BC Managed Care – PPO

## 2011-06-05 DIAGNOSIS — T8610 Unspecified complication of kidney transplant: Secondary | ICD-10-CM | POA: Diagnosis present

## 2011-06-05 LAB — GLUCOSE, CAPILLARY
Glucose-Capillary: 136 mg/dL — ABNORMAL HIGH (ref 70–99)
Glucose-Capillary: 175 mg/dL — ABNORMAL HIGH (ref 70–99)

## 2011-06-05 LAB — PHOSPHORUS: Phosphorus: 3.9 mg/dL (ref 2.3–4.6)

## 2011-06-05 LAB — RESPIRATORY VIRUS PANEL
Coronavirus229E: NOT DETECTED
CoronavirusHKU1: NOT DETECTED
CoronavirusOC43: NOT DETECTED
Influenza B: NOT DETECTED
Parainfluenza 2: NOT DETECTED
Parainfluenza 3: DETECTED — AB
Parainfluenza 4: NOT DETECTED
Rhinovirus: NOT DETECTED

## 2011-06-05 LAB — URINE CULTURE
Colony Count: >=100000
Culture  Setup Time: 201305080901

## 2011-06-05 LAB — DIFFERENTIAL
Basophils Absolute: 0 10*3/uL (ref 0.0–0.1)
Eosinophils Absolute: 0 10*3/uL (ref 0.0–0.7)
Lymphs Abs: 0.7 10*3/uL (ref 0.7–4.0)
Neutrophils Relative %: 91 % — ABNORMAL HIGH (ref 43–77)

## 2011-06-05 LAB — PNEUMOCYSTIS JIROVECI SMEAR BY DFA: Pneumocystis jiroveci Ag: NEGATIVE

## 2011-06-05 LAB — COMPREHENSIVE METABOLIC PANEL
AST: 36 U/L (ref 0–37)
Alkaline Phosphatase: 31 U/L — ABNORMAL LOW (ref 39–117)
BUN: 68 mg/dL — ABNORMAL HIGH (ref 6–23)
CO2: 24 mEq/L (ref 19–32)
Chloride: 96 mEq/L (ref 96–112)
Creatinine, Ser: 2.29 mg/dL — ABNORMAL HIGH (ref 0.50–1.35)
GFR calc non Af Amer: 31 mL/min — ABNORMAL LOW (ref >90)
Potassium: 4.1 mEq/L (ref 3.5–5.1)
Total Bilirubin: 0.6 mg/dL (ref 0.3–1.2)

## 2011-06-05 LAB — LEGIONELLA ANTIGEN, URINE

## 2011-06-05 LAB — PRO B NATRIURETIC PEPTIDE: Pro B Natriuretic peptide (BNP): 26744 pg/mL — ABNORMAL HIGH (ref 0–125)

## 2011-06-05 LAB — PARATHYROID HORMONE, INTACT (NO CA): PTH: 390.2 pg/mL — ABNORMAL HIGH (ref 14.0–72.0)

## 2011-06-05 LAB — CBC
MCH: 29.1 pg (ref 26.0–34.0)
Platelets: 83 10*3/uL — ABNORMAL LOW (ref 150–400)
RBC: 3.92 MIL/uL — ABNORMAL LOW (ref 4.22–5.81)
RDW: 16.6 % — ABNORMAL HIGH (ref 11.5–15.5)
WBC: 11.5 10*3/uL — ABNORMAL HIGH (ref 4.0–10.5)

## 2011-06-05 LAB — HEPATITIS C VRS RNA DETECT BY PCR-QUAL: Hepatitis C Vrs RNA by PCR-Qual: POSITIVE — AB

## 2011-06-05 MED ORDER — PRO-STAT SUGAR FREE PO LIQD
30.0000 mL | Freq: Two times a day (BID) | ORAL | Status: DC
  Administered 2011-06-05 – 2011-06-11 (×12): 30 mL via ORAL
  Filled 2011-06-05 (×15): qty 30

## 2011-06-05 MED ORDER — ALBUTEROL SULFATE (5 MG/ML) 0.5% IN NEBU
2.5000 mg | INHALATION_SOLUTION | RESPIRATORY_TRACT | Status: DC | PRN
  Administered 2011-06-05 – 2011-06-06 (×2): 2.5 mg via RESPIRATORY_TRACT
  Filled 2011-06-05: qty 0.5

## 2011-06-05 MED ORDER — PRO-STAT SUGAR FREE PO LIQD
30.0000 mL | Freq: Every day | ORAL | Status: DC
  Administered 2011-06-05 – 2011-06-07 (×8): 30 mL
  Filled 2011-06-05 (×14): qty 30

## 2011-06-05 MED ORDER — SODIUM CHLORIDE 0.9 % IV SOLN
1020.0000 mg | Freq: Once | INTRAVENOUS | Status: AC
  Administered 2011-06-05: 1020 mg via INTRAVENOUS
  Filled 2011-06-05: qty 34

## 2011-06-05 MED ORDER — VANCOMYCIN HCL 1000 MG IV SOLR
1750.0000 mg | INTRAVENOUS | Status: DC
  Administered 2011-06-06 – 2011-06-07 (×2): 1750 mg via INTRAVENOUS
  Filled 2011-06-05 (×3): qty 1750

## 2011-06-05 MED ORDER — ALBUTEROL SULFATE (5 MG/ML) 0.5% IN NEBU
INHALATION_SOLUTION | RESPIRATORY_TRACT | Status: AC
  Filled 2011-06-05: qty 0.5

## 2011-06-05 MED ORDER — COLLAGENASE 250 UNIT/GM EX OINT
TOPICAL_OINTMENT | Freq: Every day | CUTANEOUS | Status: DC
  Administered 2011-06-05 – 2011-06-23 (×14): via TOPICAL
  Administered 2011-06-25: 1 via TOPICAL
  Administered 2011-06-26 – 2011-06-27 (×2): via TOPICAL
  Filled 2011-06-05: qty 30

## 2011-06-05 MED ORDER — ADULT MULTIVITAMIN LIQUID CH
5.0000 mL | Freq: Every day | ORAL | Status: DC
  Administered 2011-06-05 – 2011-06-23 (×19): 5 mL
  Filled 2011-06-05 (×19): qty 5

## 2011-06-05 MED ORDER — PRO-STAT SUGAR FREE PO LIQD: 30.0000 mL | Freq: Two times a day (BID) | ORAL | Status: DC

## 2011-06-05 MED ORDER — SODIUM CHLORIDE 0.9 % IV BOLUS (SEPSIS)
1000.0000 mL | Freq: Once | INTRAVENOUS | Status: AC
  Administered 2011-06-05: 1000 mL via INTRAVENOUS

## 2011-06-05 MED ORDER — OXEPA PO LIQD
1000.0000 mL | ORAL | Status: DC
  Administered 2011-06-05 – 2011-06-08 (×4): 1000 mL
  Filled 2011-06-05 (×8): qty 1000

## 2011-06-05 MED ORDER — DILTIAZEM HCL 30 MG PO TABS
30.0000 mg | ORAL_TABLET | Freq: Three times a day (TID) | ORAL | Status: DC
  Administered 2011-06-05: 30 mg via ORAL
  Filled 2011-06-05 (×8): qty 1

## 2011-06-05 MED ORDER — LEVOFLOXACIN IN D5W 750 MG/150ML IV SOLN
750.0000 mg | INTRAVENOUS | Status: DC
  Administered 2011-06-05: 750 mg via INTRAVENOUS
  Filled 2011-06-05 (×3): qty 150

## 2011-06-05 MED ORDER — OXEPA PO LIQD
1000.0000 mL | ORAL | Status: DC
  Filled 2011-06-05 (×2): qty 1000

## 2011-06-05 NOTE — Consult Note (Signed)
WOC consult Note Reason for Consult: eval LE wound, lymphedema, noted to have multiple areas that are scarred from previous ulcers or disruptions in the skin. Wound type: ulcer of the LLE, posterior. Unclear etiology but pt does appear to have lymphedema and reports hx of this for some time after kidney transplant.  Pt is on the vent and unable to give clear history but LE have severe edema and have consistent presentation of lymphedema.  He nods that he has not ever had his legs wrapped with any type of wraps for compression/ or lymphedema treatment.   Measurement: 2.0cm x 2.0cm x 0.5cm Wound bed:100% yellow slough Drainage (amount, consistency, odor) moderate to heavy serous drainage on old foam dressing removed at the time of my assessment Periwound: intact but the entire LLE and the RLE have erythema and induration of the pretibial and calf region Dressing procedure/placement/frequency: will order Santyl for enzymatic debridement of the wound, cover with foam dressing to manage drainage.  Keep legs elevated at much as possible.  Would recommend follow up with wound care center at discharge and when this wound is close to healing would need follow up in a lymphedema clinic with PT/OT trained in manual massage /lymphatic compression wraps.  Discussed this with pt. As well.   Notified bedside nurse that I will order enzymatic debridement agent to be used on this ulcer.  Re consult if needed, will not follow at this time. Thanks  Langston [place] [employer], CWOCN [phone])

## 2011-06-05 NOTE — Progress Notes (Signed)
Pt became very labored breathing with increased HR. Pt suctioned. RT increased PEEP. Called [facility]. MD wrote orders for PRN albuterol and ordered 1 L bolus. fentanyl given and propofol restarted.

## 2011-06-05 NOTE — Progress Notes (Signed)
INFECTIOUS DISEASE PROGRESS NOTE  ID: Chad [name] is a 56 y.o. male with   Principal Problem:  *Sepsis Active Problems:  HCAP (healthcare-associated pneumonia)  Thrombocytopenia  Dehydration  Edema extremities  Hyponatremia  Acute respiratory failure  ARDS (adult respiratory distress syndrome)  Renal transplant disorder  Subjective: A + A, on vent  Abtx:  Anti-infectives     Start     Dose/Rate Route Frequency Ordered Stop   06/06/11 0600   vancomycin (VANCOCIN) 1,750 mg in sodium chloride 0.9 % 500 mL IVPB        1,750 mg 250 mL/hr over 120 Minutes Intravenous Every 24 hours 06/05/11 0906     06/05/11 2200   levofloxacin (LEVAQUIN) IVPB 750 mg  Status:  Discontinued     Comments: Use Levaquin 750 mg IV q48h for CrCl < 103mL/min      750 mg 100 mL/hr over 90 Minutes Intravenous Every 48 hours 06/04/11 0344 06/05/11 0908   06/05/11 1000   levofloxacin (LEVAQUIN) IVPB 750 mg     Comments: Use Levaquin 750 mg IV q48h for CrCl < 71mL/min      750 mg 100 mL/hr over 90 Minutes Intravenous Every 24 hours 06/05/11 0908 06/09/11 0959   06/04/11 1200  sulfamethoxazole-trimethoprim (BACTRIM) 664 mg in dextrose 5 % 500 mL IVPB       20 mg/kg/day  132.7 kg 361 mL/hr over 90 Minutes Intravenous 4 times per day 06/04/11 1039     06/04/11 1100   micafungin (MYCAMINE) 100 mg in sodium chloride 0.9 % 100 mL IVPB        100 mg 100 mL/hr over 1 Hours Intravenous Daily 06/04/11 1030     06/04/11 0600   ceFEPIme (MAXIPIME) 1 g in dextrose 5 % 50 mL IVPB     Comments: Use Cefepime 1 g IV q12h for CrCl< 60 mL/min      1 g 100 mL/hr over 30 Minutes Intravenous Every 12 hours 06/04/11 0344 06/12/11 0959   06/04/11 0600   vancomycin (VANCOCIN) 1,500 mg in sodium chloride 0.9 % 500 mL IVPB  Status:  Discontinued        1,500 mg 250 mL/hr over 120 Minutes Intravenous Every 24 hours 06/04/11 0425 06/05/11 0906   06/04/11 0430   levofloxacin (LEVAQUIN) IVPB 750 mg        750 mg 100 mL/hr  over 90 Minutes Intravenous  Once 06/04/11 0354 06/04/11 0632   06/03/11 2330   vancomycin (VANCOCIN) IVPB 1000 mg/200 mL premix        1,000 mg 200 mL/hr over 60 Minutes Intravenous  Once 06/03/11 2326 06/04/11 0051   06/03/11 2330   piperacillin-tazobactam (ZOSYN) IVPB 3.375 g        3.375 g 12.5 mL/hr over 240 Minutes Intravenous  Once 06/03/11 2326 06/04/11 0521          Medications:  Scheduled:   . antiseptic oral rinse  15 mL Mouth Rinse QID  . calcitRIOL  0.5 mcg Oral Q T,Th,S,Su  . calcitRIOL  1 mcg Oral Q M,W,F  . ceFEPime (MAXIPIME) IV  1 g Intravenous Q12H  . chlorhexidine  15 mL Mouth Rinse BID  . collagenase   Topical Daily  . diltiazem  30 mg Oral Q8H  . feeding supplement (OXEPA)  1,000 mL Per Tube Q24H  . feeding supplement  30 mL Per Tube 5 X Daily  . feeding supplement  30 mL Oral BID WC  . ferumoxytol  1,020 mg  Intravenous Once  . heparin subcutaneous  5,000 Units Subcutaneous Q8H  . insulin aspart  0-4 Units Subcutaneous Q4H  . levofloxacin (LEVAQUIN) IV  750 mg Intravenous Q24H  . methylPREDNISolone (SOLU-MEDROL) injection  40 mg Intravenous Q12H  . micafungin (MYCAMINE) IV  100 mg Intravenous Daily  . mulitivitamin  5 mL Per Tube Daily  . pantoprazole (PROTONIX) IV  40 mg Intravenous Q24H  . sirolimus  0.75 mg Oral Daily  . sulfamethoxazole-trimethoprim  20 mg/kg/day Intravenous Q6H  . vancomycin  1,750 mg Intravenous Q24H  . DISCONTD: feeding supplement  30 mL Per Tube BID WC  . DISCONTD: insulin aspart  0-5 Units Subcutaneous QHS  . DISCONTD: insulin aspart  0-9 Units Subcutaneous TID WC  . DISCONTD: levofloxacin (LEVAQUIN) IV  750 mg Intravenous Q48H  . DISCONTD: simvastatin  40 mg Oral QPM  . DISCONTD: vancomycin  1,500 mg Intravenous Q24H    Objective: Vital signs in last 24 hours: Temp:  [98.4 F (36.9 C)-101.7 F (38.7 C)] 98.6 F (37 C) (05/09 1500) Pulse Rate:  [49-135] 119  (05/09 1500) Resp:  [11-34] 24  (05/09 1500) BP:  (75-106)/(51-83) 78/61 mmHg (05/09 1500) SpO2:  [90 %-98 %] 90 % (05/09 1500) Arterial Line BP: (75-106)/(50-80) 79/70 mmHg (05/09 1500) FiO2 (%):  [40 %-50 %] 40 % (05/09 1500)   General appearance: alert and no distress Resp: rhonchi bilaterally and decreaased breath sounds on R Cardio: tachycardia GI: normal findings: bowel sounds normal and soft, non-tender and abnormal findings:  distended Extremities: edema massive edema, blistering.   Lab Results  Basename 06/05/11 0435 06/04/11 1741 06/04/11 0505  WBC 11.5* -- 15.8*  HGB 11.4* -- 13.3  HCT 35.0* -- 40.7  NA 134* 129* --  K 4.1 4.0 --  CL 96 94* --  CO2 24 24 --  BUN 68* 63* --  CREATININE 2.29* 2.06* --  GLU -- -- --   Liver Panel  Basename 06/05/11 0435 06/04/11 0505  PROT 5.9* 6.9  ALBUMIN 2.2* 2.9*  AST 36 49*  ALT 16 23  ALKPHOS 31* 47  BILITOT 0.6 1.0  BILIDIR -- --  IBILI -- --   Sedimentation Rate No results found for this basename: ESRSEDRATE in the last 72 hours C-Reactive Protein No results found for this basename: CRP:2 in the last 72 hours  Microbiology: Recent Results (from the past 240 hour(s))  URINE CULTURE     Status: Normal   Collection Time   06/03/11 10:53 PM      Component Value Range Status Comment   Specimen Description URINE, RANDOM   Final    Special Requests ADDED [id number]   Final    Culture  Setup Time [id number]   Final    Colony Count >=100,000 COLONIES/ML   Final    Culture     Final    Value: Multiple bacterial morphotypes present, none predominant. Suggest appropriate recollection if clinically indicated.   Report Status 06/05/2011 FINAL   Final   CULTURE, BLOOD (ROUTINE X 2)     Status: Normal (Preliminary result)   Collection Time   06/03/11 11:45 PM      Component Value Range Status Comment   Specimen Description BLOOD RIGHT ARM   Final    Special Requests BOTTLES DRAWN AEROBIC AND ANAEROBIC 10CC   Final    Culture  Setup Time [id number]   Final    Culture      Final    Value:  BLOOD CULTURE RECEIVED NO GROWTH TO DATE CULTURE WILL BE HELD FOR 5 DAYS BEFORE ISSUING A FINAL NEGATIVE REPORT   Report Status PENDING   Incomplete   CULTURE, BLOOD (ROUTINE X 2)     Status: Normal (Preliminary result)   Collection Time   06/03/11 11:50 PM      Component Value Range Status Comment   Specimen Description BLOOD RIGHT ARM   Final    Special Requests     Final    Value: BOTTLES DRAWN AEROBIC AND ANAEROBIC 5CC AEROBOC 3CC ANAEROBIC   Culture  Setup Time [id number]   Final    Culture     Final    Value:        BLOOD CULTURE RECEIVED NO GROWTH TO DATE CULTURE WILL BE HELD FOR 5 DAYS BEFORE ISSUING A FINAL NEGATIVE REPORT   Report Status PENDING   Incomplete   MRSA PCR SCREENING     Status: Normal   Collection Time   06/04/11  3:08 AM      Component Value Range Status Comment   MRSA by PCR NEGATIVE  NEGATIVE  Final   CULTURE, EXPECTORATED SPUTUM-ASSESSMENT     Status: Normal   Collection Time   06/04/11  7:48 AM      Component Value Range Status Comment   Specimen Description SPUTUM   Final    Special Requests NONE   Final    Sputum evaluation     Final    Value: MICROSCOPIC FINDINGS SUGGEST THAT THIS SPECIMEN IS NOT REPRESENTATIVE OF LOWER RESPIRATORY SECRETIONS. PLEASE RECOLLECT.     CALLED TO D ORTIZ,RN 06/04/11 0911 BY K SCHULTZ   Report Status 06/04/2011 FINAL   Final   PNEUMOCYSTIS JIROVECI SMEAR BY DFA     Status: Normal   Collection Time   06/04/11 10:30 AM      Component Value Range Status Comment   Specimen Source-[facility] BRONCHIAL WASHINGS   Final    Pneumocystis jiroveci Ag NEGATIVE   Final Performed at [facility]  FUNGUS CULTURE W SMEAR     Status: Normal (Preliminary result)   Collection Time   06/04/11 10:30 AM      Component Value Range Status Comment   Specimen Description BRONCHIAL WASHINGS   Final    Special Requests NONE   Final    Fungal Smear NO YEAST OR FUNGAL ELEMENTS SEEN   Final    Culture CULTURE IN PROGRESS  FOR FOUR WEEKS   Final    Report Status PENDING   Incomplete   CULTURE, RESPIRATORY     Status: Normal (Preliminary result)   Collection Time   06/04/11 10:30 AM      Component Value Range Status Comment   Specimen Description BRONCHIAL WASHINGS   Final    Special Requests NONE   Final    Gram Stain     Final    Value: FEW WBC PRESENT,BOTH PMN AND MONONUCLEAR     NO SQUAMOUS EPITHELIAL CELLS SEEN     NO ORGANISMS SEEN   Culture PENDING   Incomplete    Report Status PENDING   Incomplete     Studies/Results: Dg Chest 2 View  06/03/2011  *RADIOLOGY REPORT*  Clinical Data: Shortness of breath with cough and fever.  CHEST - 2 VIEW  Comparison: 04/11/2009  Findings: The cardiopericardial silhouette is enlarged. Interstitial markings are diffusely coarsened with chronic features.  Subtle alveolar opacities seen in the lung bases. Interstitial markings are diffusely  coarsened with chronic features. Bones are diffusely demineralized. Telemetry leads overlie the chest.  IMPRESSION: Cardiomegaly with bibasilar airspace disease suggesting pneumonia.  Original Report Authenticated By: ERIC A. MANSELL, M.D.   Dg Chest Port 1 View  06/05/2011  *RADIOLOGY REPORT*  Clinical Data: Shortness of breath, ventilator.  PORTABLE CHEST - 1 VIEW  Comparison: 06/04/2011  Findings: Support devices are unchanged.  Cardiomegaly with vascular congestion and diffuse interstitial opacities, likely interstitial edema.  Bibasilar atelectasis.  No definite effusions. Slight improved lung volumes since prior study.  IMPRESSION: Continued interstitial edema and bibasilar atelectasis.  Slight increase in lung volumes since prior study.  Original Report Authenticated By: Cyndie Chime, M.D.   Dg Chest Port 1 View  06/04/2011  *RADIOLOGY REPORT*  Clinical Data: Left internal jugular central venous catheter placement.  Endotracheal tube repositioning.  PORTABLE CHEST - 1 VIEW/2013 1135 hours:  Comparison: Portable chest x-ray earlier  same date 1030 hours.  Findings: Interval left internal jugular central venous catheter placement with its tip in the upper SVC.  No evidence of pneumothorax or mediastinal hematoma.  Endotracheal tube withdrawn slightly such that its tip is in satisfactory position approximately 4-5 cm above the carina.  Nasogastric tube courses below the diaphragm into the stomach.  Markedly suboptimal inspiration.  Cardiac silhouette enlarged but stable.  Airspace consolidation at the lung bases on the left upper lobe unchanged. Pulmonary venous hypertension, unchanged.  IMPRESSION:  1.  Left internal jugular central venous catheter tip in the upper SVC.  No acute complicating features. 2.  Endotracheal tube tip now in satisfactory position approximately 4-5 cm above the carina. 3.  Asymmetric airspace pulmonary edema versus pneumonia, left greater than right, as noted earlier.  Original Report Authenticated By: Arnell Sieving, M.D.   Dg Chest Port 1 View  06/04/2011  *RADIOLOGY REPORT*  Clinical Data: Endotracheal tube placement.  PORTABLE CHEST - 1 VIEW  Comparison: 06/03/2010.  Findings: Endotracheal tube tip just above the carina.  This may be retracted by 2 cm to avoid mainstem bronchus intubation with change of the patient's neck position.  Nasogastric tube courses below the diaphragm.  The tip is not included on this exam.  Interval development of consolidation lung bases greater on the left and patchy opacification left upper lobe.  This may represent infectious infiltrate.  Asymmetric edema also a consideration.  Cardiomegaly.  Limit evaluation of the aorta.  IMPRESSION: Endotracheal tube tip just above the carina.  This may be retracted by 2 cm to avoid mainstem bronchus intubation with change of the patient's neck position.  Nasogastric tube courses below the diaphragm.  The tip is not included on this exam.  Interval development of consolidation lung bases greater on the left and patchy opacification left upper  lobe.  This may represent infectious infiltrate.  Asymmetric edema also a consideration.  Cardiomegaly.  This has been made a PRA call report utilizing dashboard call feature.  Original Report Authenticated By: Fuller Canada, M.D.     Assessment/Plan: Pneumonia, sepsis  Urine strep/legionella (-)  Immunosuppresion for Renal Txp Hepatitis C?  Heart Murmur Cefepime/vanco/levaquin/bactrim/mycafungin Day2 WBC better, cxr slightly better.  Await hepatitis studies, Cx's, PCP DFA.  No change in anbx for now.     Johny Sax Infectious Diseases [phone] 06/05/2011, 3:41 PM   LOS: 2 days

## 2011-06-05 NOTE — Progress Notes (Signed)
Spoke with Dr. Tyson Alias regarding pt BP continuing to be 70s-80s/60s. Will continue to monitor BP. Will notify MD if MAP falls below 65 per MD request.

## 2011-06-05 NOTE — Progress Notes (Signed)
INITIAL ADULT NUTRITION ASSESSMENT Date: 06/05/2011   Time: 10:58 AM  Reason for Assessment: MD Consult for TF initiation and management  ASSESSMENT: Male 56 y.o.  Dx: Sepsis; ARDS, PNA, immune suppressed  Hx:  Past Medical History  Diagnosis Date  . Hypertension   . H/O kidney transplant    Related Meds:  Scheduled Meds:   . antiseptic oral rinse  15 mL Mouth Rinse QID  . calcitRIOL  0.5 mcg Oral Q T,Th,S,Su  . calcitRIOL  1 mcg Oral Q M,W,F  . ceFEPime (MAXIPIME) IV  1 g Intravenous Q12H  . chlorhexidine  15 mL Mouth Rinse BID  . diltiazem  30 mg Oral Q8H  . fentaNYL      . ferumoxytol  1,020 mg Intravenous Once  . heparin subcutaneous  5,000 Units Subcutaneous Q8H  . insulin aspart  0-4 Units Subcutaneous Q4H  . levofloxacin (LEVAQUIN) IV  750 mg Intravenous Q24H  . methylPREDNISolone (SOLU-MEDROL) injection  40 mg Intravenous Q12H  . micafungin (MYCAMINE) IV  100 mg Intravenous Daily  . pantoprazole (PROTONIX) IV  40 mg Intravenous Q24H  . sirolimus  0.75 mg Oral Daily  . sulfamethoxazole-trimethoprim  20 mg/kg/day Intravenous Q6H  . tuberculin  5 Units Intradermal Once  . vancomycin  1,750 mg Intravenous Q24H  . DISCONTD: calcitRIOL  0.5 mcg Oral Q T,Th,S,Su  . DISCONTD: calcitRIOL  1 mcg Oral Q M,W,F  . DISCONTD: enoxaparin (LOVENOX) injection  40 mg Subcutaneous Daily  . DISCONTD: insulin aspart  0-5 Units Subcutaneous QHS  . DISCONTD: insulin aspart  0-9 Units Subcutaneous TID WC  . DISCONTD: levofloxacin (LEVAQUIN) IV  750 mg Intravenous Q48H  . DISCONTD: simvastatin  40 mg Oral QPM  . DISCONTD: vancomycin  1,500 mg Intravenous Q24H   Continuous Infusions:   . sodium chloride 20 mL/hr at 06/05/11 0841  . dextrose    . feeding supplement (OXEPA)    . propofol 5 mcg/kg/min (06/04/11 1958)  . DISCONTD: diltiazem (CARDIZEM) infusion 10 mg/hr (06/04/11 0830)   PRN Meds:.fentaNYL, DISCONTD: acetaminophen (TYLENOL) oral liquid 160 mg/5 mL   Ht: 5\' 11"  (180.3  cm)  Wt: 292 lb 8.8 oz (132.7 kg)  Ideal Wt: 78.2 kg % Ideal Wt: 170%  Usual Wt: 277-280 lb % Usual Wt: 105% Weight slightly above usual weight likely up with positive fluid balance.  Body mass index is 40.80 kg/(m^2).  Food/Nutrition Related Hx: Eating well, no weight changes PTA.  Labs:  CMP     Component Value Date/Time   NA 134* 06/05/2011 0435   K 4.1 06/05/2011 0435   CL 96 06/05/2011 0435   CO2 24 06/05/2011 0435   GLUCOSE 126* 06/05/2011 0435   BUN 68* 06/05/2011 0435   CREATININE 2.29* 06/05/2011 0435   CALCIUM 7.8* 06/05/2011 0435   PROT 5.9* 06/05/2011 0435   ALBUMIN 2.2* 06/05/2011 0435   AST 36 06/05/2011 0435   ALT 16 06/05/2011 0435   ALKPHOS 31* 06/05/2011 0435   BILITOT 0.6 06/05/2011 0435   GFRNONAA 31* 06/05/2011 0435   GFRAA 36* 06/05/2011 0435    CBG (last 3)   Basename 06/05/11 0729 06/05/11 0351 06/05/11 0007  GLUCAP 175* 136* 105*     Intake/Output Summary (Last 24 hours) at 06/05/11 1117 Last data filed at 06/05/11 1100  Gross per 24 hour  Intake   5092 ml  Output   1305 ml  Net   3787 ml     Diet Order: NPO  Supplements/Tube Feeding: RD received  consult for TF initiation and management; Oxepa at 40 ml/h has been ordered.  IVF:    sodium chloride Last Rate: 20 mL/hr at 06/05/11 0841  dextrose   feeding supplement (OXEPA)   propofol Last Rate: 5 mcg/kg/min (06/04/11 1958)  DISCONTD: diltiazem (CARDIZEM) infusion Last Rate: 10 mg/hr (06/04/11 0830)    Estimated Nutritional Needs:   Kcal: 2600 Protein: 150-165 grams Fluid: 2.6 liters  Patient with ARDS.  Oxepa TF has been ordered via OG tube.  Patient is morbidly obese (class 3, extreme obesity) with BMI>40.  Patient and family report no nutrition problems PTA.    NUTRITION DIAGNOSIS: -Inadequate oral intake (NI-2.1).  Status: Ongoing  RELATED TO: inability to eat  AS EVIDENCED BY: NPO status  MONITORING/EVALUATION(Goals):  Enteral nutrition to provide 60-70% of estimated calorie needs (22-25  kcals/kg ideal body weight) and 100% of estimated protein needs, based on ASPEN guidelines for permissive underfeeding in critically ill obese individuals.  Monitor for TF tolerance/adequacy, labs, weight trend.  EDUCATION NEEDS: -Education not appropriate at this time  INTERVENTION:  Continue Oxepa decrease goal rate to 30 ml/h with Prostat 30 ml 7 x/day to provide 1780 kcals, 150 grams protein, 568 ml free water daily.  Multivitamin daily to help meet 100% DRI's.  Dietitian #: [phone]  DOCUMENTATION CODES Per approved criteria  -Morbid Obesity    Hettie Holstein 06/05/2011, 10:58 AM

## 2011-06-05 NOTE — Progress Notes (Signed)
Spoke with pharmacy regarding concern with giving pt Cardizem with low BP (80s-90s/60s). Pharmacy states ok to give at this time.

## 2011-06-05 NOTE — Progress Notes (Signed)
Also notified MD regarding decrease in pt UOP.

## 2011-06-05 NOTE — Consult Note (Signed)
Name: Chad [name] MRN: [id number] DOB: 1955-06-25    LOS: 2  PCCM ADMISSION NOTE  History of Present Illness: [name] s/p renal Tx, immune suppressed, with 3 days of cough, shortness of breath and fever. He has been on immunosuppressants for his renal transplant. N/Vom noted at home.Admitted to sdu, declines requiring emergent intubation, near arrest. Emergent bronch done, bloody secretions and pus throughout.  Lines / Drains: Left ij 5/8>>> ETT 5/8>>> Aline 5/8>>>  Cultures: BAL bronch 5/8>>> Bronch  afb>>>   Pcp>>>   Fungus>>> BC 5/7>>> Sputum 5/7>>> Urine 5/7>>>contaminated  Antibiotics: 5/7 vanc>>> 5/7 levoflox>>> 5/7 myco>>> 5/7 bactrim / steroids>>>  Tests / Events: 5/8 hypoxic resp failure, intubated, near arrest 5/9- no pressors needed, resting on vent, pos balance  Vital Signs: Temp:  [98.4 F (36.9 C)-101.7 F (38.7 C)] 98.4 F (36.9 C) (05/09 0826) Pulse Rate:  [42-135] 124  (05/09 0826) Resp:  [15-34] 34  (05/09 0826) BP: (68-159)/(49-96) 92/61 mmHg (05/09 0826) SpO2:  [88 %-98 %] 92 % (05/09 0826) Arterial Line BP: (75-132)/(50-80) 94/76 mmHg (05/09 0800) FiO2 (%):  [40 %-100 %] 40 % (05/09 0826) I/O last 3 completed shifts: In: 5670 [I.V.:2154; IV Piggyback:3516] Out: 2490 [Urine:2490]  Physical Examination: General:  Calm on vent Neuro:  Nonfocal HEENT:  Line clean Neck:  jvd mild Cardiovascular:  ST S2 S2 rrr Lungs:  ronchi severe rt greater left Abdomen:  Soft, bs wnl no r/g Lower ext- severe chronic edema  Ventilator settings: Vent Mode:  [-] PRVC FiO2 (%):  [40 %-100 %] 40 % Set Rate:  [20 bmp-22 bmp] 22 bmp Vt Set:  [450 mL-600 mL] 450 mL PEEP:  [8 cmH20-10 cmH20] 8 cmH20 Plateau Pressure:  [16 cmH20-23 cmH20] 16 cmH20  Labs and Imaging:  Reviewed.  Please refer to the Assessment and Plan section for relevant results.  Assessment and Plan: 1. Pulmonary   ARDS PNA, immune suppressed Acute respiratory failure -abg  last reviewed,likely will be more alk, abg repeat themn reduce rate likely needed -maintain ARDS, plat wnl, keep TV at 450, 6 cc/kg -Goal to peep 5 if able, if so , then sbt, if unable then cpap 8 ps  X wean -Unlikely to get extubated today -please note on bronch ETT gets hung p on a thick ring left and difficult to advance passed this, keep ETT at 1.5 above carina and not pull back if able to avoid -last echo pulm htn, kvo now , remains normortensive Low threshold lasix soon  2. CVS One bout SVT  With distress pre intubation, NO fib noted Sepsis, severe -no fib -pulm htn in past, echo 2011, repeat echo now, worsening valvular dz? cvp noted, kvo Lactic acid 1  3. Renal Renal TX, ATN -cvp up, no pressors, kvo -appreciate renal help -chem in am  -pharmacy trying to obtain immune suppression med?  4. ID PNA Immunocommprommsied host Empiric pcp, HCAP, fungal, atypical  coverage, see dashboard -follow bronch result -Per ID, appreciate recs -concentrate what we can  -follow pcp, steroids to remain Leg, strep neg  4. Heme Leukocytosis, low plat sepsis dilution likely Sub q hep, follow trend, low threshold stop, HITT assay  5. Endo cbg Steroids for pcp, at risk rel AI  6. MIld agitation -may be able to dc poropofol -at risk delirium -WUA -chair position  7. Malnurished -start TF  -ppi -lft noted -oxepa (ARDS)  8. H/o Hep C -dc statin May need records from [facility] coags wnl  Best practices / Disposition: -->ICU status under PCCM -->full code -->Heparin for DVT Px sub q -->Protonix for GI Px -->ventilator bundle -->diet -->family updated at bedside, sister updated   The patient is critically ill with multiple organ systems failure and requires high complexity decision making for assessment and support, frequent evaluation and titration of therapies, application of advanced monitoring technologies and extensive interpretation of multiple databases. Critical Care  Time devoted to patient care services described in this note is 40 minutes.  [name] 06/05/2011, 8:29 AM  Mcarthur Rossetti. Tyson Alias, MD, FACP Pgr: [phone] [facility]

## 2011-06-05 NOTE — Progress Notes (Signed)
Pt family brought medication (rapamune) from home per pharmacy request. Pharmacy unable to order. Gave medication to pharmacist for verification.

## 2011-06-05 NOTE — Progress Notes (Signed)
Patient hypotensive when asleep SBP 75 with MAP of 60, when patient awake SBP low 80's with MAP 64. No sedation on at this time. MD made aware, no new orders at this time. Will continue to closely monitor.  Doree Albee

## 2011-06-05 NOTE — Progress Notes (Signed)
ANTIBIOTIC CONSULT NOTE - INITIAL  Pharmacy Consult for Micafungin, Bactrim, Vancomycin, Levaquin Indication: Broad coverage of PNA in immunocompromised patient  No Known Allergies  Patient Measurements: Height: 5\' 11"  (180.3 cm) Weight: 292 lb 8.8 oz (132.7 kg) IBW/kg (Calculated) : 75.3   Vital Signs: Temp: 98.4 F (36.9 C) (05/09 0826) BP: 92/61 mmHg (05/09 0826) Pulse Rate: 124  (05/09 0826) Intake/Output from previous day: 05/08 0701 - 05/09 0700 In: 5410 [I.V.:2094; IV Piggyback:3316] Out: 1540 [Urine:1540] Intake/Output from this shift: Total I/O In: 100 [I.V.:100] Out: -   Labs:  Basename 06/05/11 0435 06/04/11 1741 06/04/11 0505 06/03/11 2255  WBC 11.5* -- 15.8* 17.6*  HGB 11.4* -- 13.3 13.9  PLT 83* -- 112* 116*  LABCREA -- -- -- --  CREATININE 2.29* 2.06* 2.19* --   Estimated Creatinine Clearance: 51.3 ml/min (by C-G formula based on Cr of 2.29).  Microbiology: Recent Results (from the past 720 hour(s))  URINE CULTURE     Status: Normal   Collection Time   06/03/11 10:53 PM      Component Value Range Status Comment   Specimen Description URINE, RANDOM   Final    Special Requests ADDED [id number]   Final    Culture  Setup Time [id number]   Final    Colony Count >=100,000 COLONIES/ML   Final    Culture     Final    Value: Multiple bacterial morphotypes present, none predominant. Suggest appropriate recollection if clinically indicated.   Report Status 06/05/2011 FINAL   Final   CULTURE, BLOOD (ROUTINE X 2)     Status: Normal (Preliminary result)   Collection Time   06/03/11 11:45 PM      Component Value Range Status Comment   Specimen Description BLOOD RIGHT ARM   Final    Special Requests BOTTLES DRAWN AEROBIC AND ANAEROBIC 10CC   Final    Culture  Setup Time [id number]   Final    Culture     Final    Value:        BLOOD CULTURE RECEIVED NO GROWTH TO DATE CULTURE WILL BE HELD FOR 5 DAYS BEFORE ISSUING A FINAL NEGATIVE REPORT   Report Status  PENDING   Incomplete   CULTURE, BLOOD (ROUTINE X 2)     Status: Normal (Preliminary result)   Collection Time   06/03/11 11:50 PM      Component Value Range Status Comment   Specimen Description BLOOD RIGHT ARM   Final    Special Requests     Final    Value: BOTTLES DRAWN AEROBIC AND ANAEROBIC 5CC AEROBOC 3CC ANAEROBIC   Culture  Setup Time [id number]   Final    Culture     Final    Value:        BLOOD CULTURE RECEIVED NO GROWTH TO DATE CULTURE WILL BE HELD FOR 5 DAYS BEFORE ISSUING A FINAL NEGATIVE REPORT   Report Status PENDING   Incomplete   MRSA PCR SCREENING     Status: Normal   Collection Time   06/04/11  3:08 AM      Component Value Range Status Comment   MRSA by PCR NEGATIVE  NEGATIVE  Final   CULTURE, EXPECTORATED SPUTUM-ASSESSMENT     Status: Normal   Collection Time   06/04/11  7:48 AM      Component Value Range Status Comment   Specimen Description SPUTUM   Final    Special Requests NONE   Final    Sputum  evaluation     Final    Value: MICROSCOPIC FINDINGS SUGGEST THAT THIS SPECIMEN IS NOT REPRESENTATIVE OF LOWER RESPIRATORY SECRETIONS. PLEASE RECOLLECT.     CALLED TO D ORTIZ,RN 06/04/11 0911 BY K SCHULTZ   Report Status 06/04/2011 FINAL   Final   CULTURE, RESPIRATORY     Status: Normal (Preliminary result)   Collection Time   06/04/11 10:30 AM      Component Value Range Status Comment   Specimen Description BRONCHIAL WASHINGS   Final    Special Requests NONE   Final    Gram Stain     Final    Value: FEW WBC PRESENT,BOTH PMN AND MONONUCLEAR     NO SQUAMOUS EPITHELIAL CELLS SEEN     NO ORGANISMS SEEN   Culture PENDING   Incomplete    Report Status PENDING   Incomplete     Medical History: Past Medical History  Diagnosis Date  . Hypertension   . H/O kidney transplant     Assessment: 56 yo M emergently intubated and bronchoscopy performed for respiratory failure 5/8. Pt continues on broad spectrum antibiotics/antifungals. ID MD following.  History of renal  transplant, seems to be near baseline renal function. UOP better today.  Goal of Therapy:  resolution of infection; Vanco trough 15-20 mcg/ml  Plan:  1.  Continue cefepime 1 gm IV q 12hr 2.  Change Levaquin to 750 mg IV q 24hr  3.  Change vancomycin to 1750 mg IV q 24hr 4.  Micafungin 100 mg IV q 24hr 5.  Continue Bactrim 664 mg q 6hr  6.  F/up cultures, renal function, transition of bactrim to po when able , vancomycin trough at [facility]  Christoper Fabian, PharmD, BCPS Clinical pharmacist, pager [phone] 06/05/2011,9:00 AM

## 2011-06-05 NOTE — Progress Notes (Addendum)
eLink Physician-Brief Progress Note Patient Name: Chad [name] DOB: 1955/06/12 MRN: [id number]  Date of Service  06/05/2011   HPI/Events of Note   bp soft and rn unable to give diprivan  eICU Interventions  Fluid bolus 1L and hopefully that will allow diprivan restaart  Addendum 6:33 PM on 06/05/2011   - resp distress and vent dysnchrony  - BP now improved wit fluid start  Plan  - alb prn (wheezing per RN) Restart diprivan   Intervention Category Major Interventions: Hypovolemia - evaluation and treatment with fluids  Chad [name] 06/05/2011, 6:12 PM

## 2011-06-05 NOTE — Progress Notes (Signed)
Subjective: Interval History: none.  Objective: Vital signs in last 24 hours: Temp:  [98.4 F (36.9 C)-101.7 F (38.7 C)] 98.4 F (36.9 C) (05/09 0826) Pulse Rate:  [42-135] 124  (05/09 0826) Resp:  [15-34] 34  (05/09 0826) BP: (68-159)/(49-96) 92/61 mmHg (05/09 0826) SpO2:  [88 %-98 %] 92 % (05/09 0826) Arterial Line BP: (75-132)/(50-80) 94/76 mmHg (05/09 0800) FiO2 (%):  [40 %-100 %] 40 % (05/09 0853) Weight change:   Intake/Output from previous day: 05/08 0701 - 05/09 0700 In: 5410 [I.V.:2094; IV Piggyback:3316] Out: 1540 [Urine:1540] Intake/Output this shift: Total I/O In: 100 [I.V.:100] Out: 50 [Urine:50]  General appearance: alert Resp: rales bilaterally Cardio: rate 140s, Gr 1-2 /6 M GI: Obese ps bs, liver down 5 cm  TX RLQ Extremities: edema 4 plus  Lab Results:  [facility] 06/05/11 0435 06/04/11 0505  WBC 11.5* 15.8*  HGB 11.4* 13.3  HCT 35.0* 40.7  PLT 83* 112*   BMET:  Basename 06/05/11 0435 06/04/11 1741  NA 134* 129*  K 4.1 4.0  CL 96 94*  CO2 24 24  GLUCOSE 126* 140*  BUN 68* 63*  CREATININE 2.29* 2.06*  CALCIUM 7.8* 8.1*   No results found for this basename: PTH:2 in the last 72 hours Iron Studies:  Basename 06/04/11 1741  IRON 11*  TIBC 195*  TRANSFERRIN --  FERRITIN --    Studies/Results: Dg Chest 2 View  06/03/2011  *RADIOLOGY REPORT*  Clinical Data: Shortness of breath with cough and fever.  CHEST - 2 VIEW  Comparison: 04/11/2009  Findings: The cardiopericardial silhouette is enlarged. Interstitial markings are diffusely coarsened with chronic features.  Subtle alveolar opacities seen in the lung bases. Interstitial markings are diffusely coarsened with chronic features. Bones are diffusely demineralized. Telemetry leads overlie the chest.  IMPRESSION: Cardiomegaly with bibasilar airspace disease suggesting pneumonia.  Original Report Authenticated By: ERIC A. MANSELL, M.D.   Dg Chest Port 1 View  06/05/2011  *RADIOLOGY REPORT*  Clinical  Data: Shortness of breath, ventilator.  PORTABLE CHEST - 1 VIEW  Comparison: 06/04/2011  Findings: Support devices are unchanged.  Cardiomegaly with vascular congestion and diffuse interstitial opacities, likely interstitial edema.  Bibasilar atelectasis.  No definite effusions. Slight improved lung volumes since prior study.  IMPRESSION: Continued interstitial edema and bibasilar atelectasis.  Slight increase in lung volumes since prior study.  Original Report Authenticated By: Cyndie Chime, M.D.   Dg Chest Port 1 View  06/04/2011  *RADIOLOGY REPORT*  Clinical Data: Left internal jugular central venous catheter placement.  Endotracheal tube repositioning.  PORTABLE CHEST - 1 VIEW/2013 1135 hours:  Comparison: Portable chest x-ray earlier same date 1030 hours.  Findings: Interval left internal jugular central venous catheter placement with its tip in the upper SVC.  No evidence of pneumothorax or mediastinal hematoma.  Endotracheal tube withdrawn slightly such that its tip is in satisfactory position approximately 4-5 cm above the carina.  Nasogastric tube courses below the diaphragm into the stomach.  Markedly suboptimal inspiration.  Cardiac silhouette enlarged but stable.  Airspace consolidation at the lung bases on the left upper lobe unchanged. Pulmonary venous hypertension, unchanged.  IMPRESSION:  1.  Left internal jugular central venous catheter tip in the upper SVC.  No acute complicating features. 2.  Endotracheal tube tip now in satisfactory position approximately 4-5 cm above the carina. 3.  Asymmetric airspace pulmonary edema versus pneumonia, left greater than right, as noted earlier.  Original Report Authenticated By: Arnell Sieving, M.D.   Dg Chest  Port 1 View  06/04/2011  *RADIOLOGY REPORT*  Clinical Data: Endotracheal tube placement.  PORTABLE CHEST - 1 VIEW  Comparison: 06/03/2010.  Findings: Endotracheal tube tip just above the carina.  This may be retracted by 2 cm to avoid mainstem  bronchus intubation with change of the patient's neck position.  Nasogastric tube courses below the diaphragm.  The tip is not included on this exam.  Interval development of consolidation lung bases greater on the left and patchy opacification left upper lobe.  This may represent infectious infiltrate.  Asymmetric edema also a consideration.  Cardiomegaly.  Limit evaluation of the aorta.  IMPRESSION: Endotracheal tube tip just above the carina.  This may be retracted by 2 cm to avoid mainstem bronchus intubation with change of the patient's neck position.  Nasogastric tube courses below the diaphragm.  The tip is not included on this exam.  Interval development of consolidation lung bases greater on the left and patchy opacification left upper lobe.  This may represent infectious infiltrate.  Asymmetric edema also a consideration.  Cardiomegaly.  This has been made a PRA call report utilizing dashboard call feature.  Original Report Authenticated By: Fuller Canada, M.D.    I have reviewed the patient's current medications.  Assessment/Plan: 1 Renal TX stable.  Vol xs but with low BP hold off diuresis 2 SVT follow. 3 Anemia Fe low replete 4 HPTH recheck 5 Obesity 6 Pneu per CCM  P Fe, follow rate closely, Sirolimus    LOS: 2 days   Chad [name] 06/05/2011,9:02 AM

## 2011-06-05 NOTE — Progress Notes (Signed)
Spoke with Dr. Darrick Penna regarding parameters for Cardizem. MD wants to hold med if SBP< 85. Held 1400 dose of cardizem.

## 2011-06-06 ENCOUNTER — Inpatient Hospital Stay (HOSPITAL_COMMUNITY): Payer: BC Managed Care – PPO

## 2011-06-06 LAB — GLUCOSE, CAPILLARY
Glucose-Capillary: 132 mg/dL — ABNORMAL HIGH (ref 70–99)
Glucose-Capillary: 133 mg/dL — ABNORMAL HIGH (ref 70–99)
Glucose-Capillary: 141 mg/dL — ABNORMAL HIGH (ref 70–99)
Glucose-Capillary: 142 mg/dL — ABNORMAL HIGH (ref 70–99)

## 2011-06-06 LAB — CBC
HCT: 33.8 % — ABNORMAL LOW (ref 39.0–52.0)
Hemoglobin: 11.4 g/dL — ABNORMAL LOW (ref 13.0–17.0)
RBC: 3.92 MIL/uL — ABNORMAL LOW (ref 4.22–5.81)
WBC: 18 10*3/uL — ABNORMAL HIGH (ref 4.0–10.5)

## 2011-06-06 LAB — DIFFERENTIAL
Basophils Absolute: 0 10*3/uL (ref 0.0–0.1)
Basophils Relative: 0 % (ref 0–1)
Eosinophils Absolute: 0 10*3/uL (ref 0.0–0.7)
Lymphocytes Relative: 4 % — ABNORMAL LOW (ref 12–46)
Monocytes Absolute: 0.7 10*3/uL (ref 0.1–1.0)
Monocytes Relative: 4 % (ref 3–12)
Neutro Abs: 16.6 10*3/uL — ABNORMAL HIGH (ref 1.7–7.7)

## 2011-06-06 LAB — COMPREHENSIVE METABOLIC PANEL
AST: 32 U/L (ref 0–37)
CO2: 22 mEq/L (ref 19–32)
Calcium: 7.5 mg/dL — ABNORMAL LOW (ref 8.4–10.5)
Creatinine, Ser: 2.78 mg/dL — ABNORMAL HIGH (ref 0.50–1.35)
GFR calc non Af Amer: 24 mL/min — ABNORMAL LOW (ref >90)
Sodium: 129 mEq/L — ABNORMAL LOW (ref 135–145)
Total Protein: 6.1 g/dL (ref 6.0–8.3)

## 2011-06-06 LAB — PHOSPHORUS: Phosphorus: 4.7 mg/dL — ABNORMAL HIGH (ref 2.3–4.6)

## 2011-06-06 MED ORDER — FENTANYL BOLUS VIA INFUSION
50.0000 ug | Freq: Four times a day (QID) | INTRAVENOUS | Status: DC | PRN
  Administered 2011-06-13: 100 ug via INTRAVENOUS
  Filled 2011-06-06: qty 100

## 2011-06-06 MED ORDER — SODIUM CHLORIDE 0.9 % IV SOLN
50.0000 ug/h | INTRAVENOUS | Status: DC
  Administered 2011-06-06: 50 ug/h via INTRAVENOUS
  Administered 2011-06-08 – 2011-06-09 (×2): 75 ug/h via INTRAVENOUS
  Administered 2011-06-10 – 2011-06-12 (×3): 100 ug/h via INTRAVENOUS
  Filled 2011-06-06 (×7): qty 50

## 2011-06-06 MED ORDER — ALBUMIN HUMAN 25 % IV SOLN
25.0000 g | Freq: Once | INTRAVENOUS | Status: AC
  Administered 2011-06-06: 25 g via INTRAVENOUS
  Filled 2011-06-06 (×2): qty 100

## 2011-06-06 MED ORDER — FUROSEMIDE 10 MG/ML IJ SOLN
160.0000 mg | Freq: Four times a day (QID) | INTRAVENOUS | Status: AC
  Administered 2011-06-06 (×2): 160 mg via INTRAVENOUS
  Filled 2011-06-06 (×3): qty 16

## 2011-06-06 MED ORDER — FUROSEMIDE 10 MG/ML IJ SOLN: 80.0000 mg | Freq: Once | INTRAMUSCULAR | Status: DC

## 2011-06-06 NOTE — Progress Notes (Signed)
Subjective: Interval History: none.  Objective: Vital signs in last 24 hours: Temp:  [97.9 F (36.6 C)-98.8 F (37.1 C)] 98.2 F (36.8 C) (05/10 0806) Pulse Rate:  [101-222] 146  (05/10 0806) Resp:  [11-34] 27  (05/10 0806) BP: (70-99)/(50-76) 94/74 mmHg (05/10 0806) SpO2:  [88 %-96 %] 96 % (05/10 0806) Arterial Line BP: (68-98)/(57-80) 69/62 mmHg (05/10 0600) FiO2 (%):  [40 %-50 %] 50 % (05/10 0806) Weight:  [143.5 kg (316 lb 5.8 oz)] 143.5 kg (316 lb 5.8 oz) (05/10 0500) Weight change:   Intake/Output from previous day: 05/09 0701 - 05/10 0700 In: 5034 [I.V.:584; NG/GT:520; IV Piggyback:3930] Out: 430 [Urine:430] Intake/Output this shift:    General appearance: cooperative, delirious and morbidly obese Resp: diminished breath sounds bilaterally and rales bilaterally Cardio: regular rate and rhythm and systolic murmur: holosystolic 2/6, blowing at apex GI: obese,pos bs,liver down 5 cm, tx RLQ Extremities: edema 4 plus  Lab Results:  Basename 06/06/11 0500 06/05/11 0435  WBC 18.0* 11.5*  HGB 11.4* 11.4*  HCT 33.8* 35.0*  PLT 92* 83*   BMET:  Basename 06/06/11 0500 06/05/11 0435  NA 129* 134*  K 3.9 4.1  CL 92* 96  CO2 22 24  GLUCOSE 124* 126*  BUN 87* 68*  CREATININE 2.78* 2.29*  CALCIUM 7.5* 7.8*    Basename 06/04/11 1741  PTH 390.2*   Iron Studies:  Basename 06/04/11 1741  IRON 11*  TIBC 195*  TRANSFERRIN --  FERRITIN --    Studies/Results: Dg Chest Port 1 View  06/06/2011  *RADIOLOGY REPORT*  Clinical Data: Respiratory difficulty  PORTABLE CHEST - 1 VIEW  Comparison: Yesterday  Findings: Endotracheal tube tip remains just above the carina.  NG tube and left internal jugular center venous catheter are stable. Severe cardiomegaly unchanged.  Diffuse edema has improved. Persistent central basilar hazy airspace disease.  Pleural effusions may be present and are small.  IMPRESSION: Overall improved volume overload.  Stable support structures.  Original  Report Authenticated By: Donavan Burnet, M.D.   Dg Chest Port 1 View  06/05/2011  *RADIOLOGY REPORT*  Clinical Data: Shortness of breath, ventilator.  PORTABLE CHEST - 1 VIEW  Comparison: 06/04/2011  Findings: Support devices are unchanged.  Cardiomegaly with vascular congestion and diffuse interstitial opacities, likely interstitial edema.  Bibasilar atelectasis.  No definite effusions. Slight improved lung volumes since prior study.  IMPRESSION: Continued interstitial edema and bibasilar atelectasis.  Slight increase in lung volumes since prior study.  Original Report Authenticated By: Cyndie Chime, M.D.   Dg Chest Port 1 View  06/04/2011  *RADIOLOGY REPORT*  Clinical Data: Left internal jugular central venous catheter placement.  Endotracheal tube repositioning.  PORTABLE CHEST - 1 VIEW/2013 1135 hours:  Comparison: Portable chest x-ray earlier same date 1030 hours.  Findings: Interval left internal jugular central venous catheter placement with its tip in the upper SVC.  No evidence of pneumothorax or mediastinal hematoma.  Endotracheal tube withdrawn slightly such that its tip is in satisfactory position approximately 4-5 cm above the carina.  Nasogastric tube courses below the diaphragm into the stomach.  Markedly suboptimal inspiration.  Cardiac silhouette enlarged but stable.  Airspace consolidation at the lung bases on the left upper lobe unchanged. Pulmonary venous hypertension, unchanged.  IMPRESSION:  1.  Left internal jugular central venous catheter tip in the upper SVC.  No acute complicating features. 2.  Endotracheal tube tip now in satisfactory position approximately 4-5 cm above the carina. 3.  Asymmetric airspace pulmonary edema  versus pneumonia, left greater than right, as noted earlier.  Original Report Authenticated By: Arnell Sieving, M.D.   Dg Chest Port 1 View  06/04/2011  *RADIOLOGY REPORT*  Clinical Data: Endotracheal tube placement.  PORTABLE CHEST - 1 VIEW  Comparison:  06/03/2010.  Findings: Endotracheal tube tip just above the carina.  This may be retracted by 2 cm to avoid mainstem bronchus intubation with change of the patient's neck position.  Nasogastric tube courses below the diaphragm.  The tip is not included on this exam.  Interval development of consolidation lung bases greater on the left and patchy opacification left upper lobe.  This may represent infectious infiltrate.  Asymmetric edema also a consideration.  Cardiomegaly.  Limit evaluation of the aorta.  IMPRESSION: Endotracheal tube tip just above the carina.  This may be retracted by 2 cm to avoid mainstem bronchus intubation with change of the patient's neck position.  Nasogastric tube courses below the diaphragm.  The tip is not included on this exam.  Interval development of consolidation lung bases greater on the left and patchy opacification left upper lobe.  This may represent infectious infiltrate.  Asymmetric edema also a consideration.  Cardiomegaly.  This has been made a PRA call report utilizing dashboard call feature.  Original Report Authenticated By: Fuller Canada, M.D.    I have reviewed the patient's current medications.  Assessment/Plan: 1 Renal TX Cr ^, suspect hemodynamic, follow.  Hold of Lasix unless BP ^.  If > 100, will give Lasix 2 Pneu 3 Cardiac  Would like to know EF 4 Obesity 5 HPTH 6  P AB, d/c Septra if ok with CCM, limit vol , TF. Follow BP and give Lasix if ^, Steroid, check [place] level    LOS: 3 days   Keon Pender L 06/06/2011,8:19 AM

## 2011-06-06 NOTE — Progress Notes (Signed)
INFECTIOUS DISEASE PROGRESS NOTE  ID: ROMELO [name] is a 56 y.o. male with   Principal Problem:  *Sepsis Active Problems:  HCAP (healthcare-associated pneumonia)  Thrombocytopenia  Dehydration  Edema extremities  Hyponatremia  Acute respiratory failure  ARDS (adult respiratory distress syndrome)  Renal transplant disorder  Subjective: On vent, uncomfortable  Abtx:  Anti-infectives     Start     Dose/Rate Route Frequency Ordered Stop   06/06/11 0600   vancomycin (VANCOCIN) 1,750 mg in sodium chloride 0.9 % 500 mL IVPB        1,750 mg 250 mL/hr over 120 Minutes Intravenous Every 24 hours 06/05/11 0906     06/05/11 2200   levofloxacin (LEVAQUIN) IVPB 750 mg  Status:  Discontinued     Comments: Use Levaquin 750 mg IV q48h for CrCl < 81mL/min      750 mg 100 mL/hr over 90 Minutes Intravenous Every 48 hours 06/04/11 0344 06/05/11 0908   06/05/11 1000   levofloxacin (LEVAQUIN) IVPB 750 mg     Comments: Use Levaquin 750 mg IV q48h for CrCl < 48mL/min      750 mg 100 mL/hr over 90 Minutes Intravenous Every 24 hours 06/05/11 0908 06/09/11 0959   06/04/11 1200  sulfamethoxazole-trimethoprim (BACTRIM) 664 mg in dextrose 5 % 500 mL IVPB       20 mg/kg/day  132.7 kg 361 mL/hr over 90 Minutes Intravenous 4 times per day 06/04/11 1039     06/04/11 1100   micafungin (MYCAMINE) 100 mg in sodium chloride 0.9 % 100 mL IVPB        100 mg 100 mL/hr over 1 Hours Intravenous Daily 06/04/11 1030     06/04/11 0600   ceFEPIme (MAXIPIME) 1 g in dextrose 5 % 50 mL IVPB     Comments: Use Cefepime 1 g IV q12h for CrCl< 60 mL/min      1 g 100 mL/hr over 30 Minutes Intravenous Every 12 hours 06/04/11 0344 06/12/11 0959   06/04/11 0600   vancomycin (VANCOCIN) 1,500 mg in sodium chloride 0.9 % 500 mL IVPB  Status:  Discontinued        1,500 mg 250 mL/hr over 120 Minutes Intravenous Every 24 hours 06/04/11 0425 06/05/11 0906   06/04/11 0430   levofloxacin (LEVAQUIN) IVPB 750 mg        750  mg 100 mL/hr over 90 Minutes Intravenous  Once 06/04/11 0354 06/04/11 0632   06/03/11 2330   vancomycin (VANCOCIN) IVPB 1000 mg/200 mL premix        1,000 mg 200 mL/hr over 60 Minutes Intravenous  Once 06/03/11 2326 06/04/11 0051   06/03/11 2330   piperacillin-tazobactam (ZOSYN) IVPB 3.375 g        3.375 g 12.5 mL/hr over 240 Minutes Intravenous  Once 06/03/11 2326 06/04/11 0521          Medications:  Scheduled:   . albuterol      . antiseptic oral rinse  15 mL Mouth Rinse QID  . calcitRIOL  0.5 mcg Oral Q T,Th,S,Su  . calcitRIOL  1 mcg Oral Q M,W,F  . ceFEPime (MAXIPIME) IV  1 g Intravenous Q12H  . chlorhexidine  15 mL Mouth Rinse BID  . collagenase   Topical Daily  . feeding supplement (OXEPA)  1,000 mL Per Tube Q24H  . feeding supplement  30 mL Per Tube 5 X Daily  . feeding supplement  30 mL Oral BID WC  . ferumoxytol  1,020 mg Intravenous Once  .  heparin subcutaneous  5,000 Units Subcutaneous Q8H  . insulin aspart  0-4 Units Subcutaneous Q4H  . levofloxacin (LEVAQUIN) IV  750 mg Intravenous Q24H  . methylPREDNISolone (SOLU-MEDROL) injection  40 mg Intravenous Q12H  . micafungin (MYCAMINE) IV  100 mg Intravenous Daily  . mulitivitamin  5 mL Per Tube Daily  . pantoprazole (PROTONIX) IV  40 mg Intravenous Q24H  . sirolimus  0.75 mg Oral Daily  . sodium chloride  1,000 mL Intravenous Once  . sulfamethoxazole-trimethoprim  20 mg/kg/day Intravenous Q6H  . vancomycin  1,750 mg Intravenous Q24H  . DISCONTD: diltiazem  30 mg Oral Q8H  . DISCONTD: feeding supplement  30 mL Per Tube BID WC  . DISCONTD: furosemide  80 mg Intravenous Once  . DISCONTD: levofloxacin (LEVAQUIN) IV  750 mg Intravenous Q48H  . DISCONTD: vancomycin  1,500 mg Intravenous Q24H    Objective: Vital signs in last 24 hours: Temp:  [97.9 F (36.6 C)-98.8 F (37.1 C)] 98.2 F (36.8 C) (05/10 0806) Pulse Rate:  [78-222] 146  (05/10 0806) Resp:  [11-29] 27  (05/10 0806) BP: (70-133)/(50-76) 94/74 mmHg  (05/10 0806) SpO2:  [88 %-96 %] 96 % (05/10 0806) Arterial Line BP: (68-104)/(57-82) 104/82 mmHg (05/10 0800) FiO2 (%):  [40 %-50 %] 50 % (05/10 0806) Weight:  [143.5 kg (316 lb 5.8 oz)] 143.5 kg (316 lb 5.8 oz) (05/10 0500)   General appearance: alert and moderate distress Resp: tachypneic, rhonchorous Cardio: tachycardia GI: normal findings: soft, non-tender and abnormal findings:  distended and hypoactive bowel sounds  Lab Results  Basename 06/06/11 0500 06/05/11 0435  WBC 18.0* 11.5*  HGB 11.4* 11.4*  HCT 33.8* 35.0*  NA 129* 134*  K 3.9 4.1  CL 92* 96  CO2 22 24  BUN 87* 68*  CREATININE 2.78* 2.29*  GLU -- --   Liver Panel  Basename 06/06/11 0500 06/05/11 0435  PROT 6.1 5.9*  ALBUMIN 2.3* 2.2*  AST 32 36  ALT 16 16  ALKPHOS 36* 31*  BILITOT 0.4 0.6  BILIDIR -- --  IBILI -- --   Sedimentation Rate No results found for this basename: ESRSEDRATE in the last 72 hours C-Reactive Protein No results found for this basename: CRP:2 in the last 72 hours  Microbiology: Recent Results (from the past 240 hour(s))  URINE CULTURE     Status: Normal   Collection Time   06/03/11 10:53 PM      Component Value Range Status Comment   Specimen Description URINE, RANDOM   Final    Special Requests ADDED [id number]   Final    Culture  Setup Time [id number]   Final    Colony Count >=100,000 COLONIES/ML   Final    Culture     Final    Value: Multiple bacterial morphotypes present, none predominant. Suggest appropriate recollection if clinically indicated.   Report Status 06/05/2011 FINAL   Final   CULTURE, BLOOD (ROUTINE X 2)     Status: Normal (Preliminary result)   Collection Time   06/03/11 11:45 PM      Component Value Range Status Comment   Specimen Description BLOOD RIGHT ARM   Final    Special Requests BOTTLES DRAWN AEROBIC AND ANAEROBIC 10CC   Final    Culture  Setup Time [id number]   Final    Culture     Final    Value:        BLOOD CULTURE RECEIVED NO GROWTH TO  DATE CULTURE WILL BE HELD FOR  5 DAYS BEFORE ISSUING A FINAL NEGATIVE REPORT   Report Status PENDING   Incomplete   CULTURE, BLOOD (ROUTINE X 2)     Status: Normal (Preliminary result)   Collection Time   06/03/11 11:50 PM      Component Value Range Status Comment   Specimen Description BLOOD RIGHT ARM   Final    Special Requests     Final    Value: BOTTLES DRAWN AEROBIC AND ANAEROBIC 5CC AEROBOC 3CC ANAEROBIC   Culture  Setup Time [id number]   Final    Culture     Final    Value:        BLOOD CULTURE RECEIVED NO GROWTH TO DATE CULTURE WILL BE HELD FOR 5 DAYS BEFORE ISSUING A FINAL NEGATIVE REPORT   Report Status PENDING   Incomplete   MRSA PCR SCREENING     Status: Normal   Collection Time   06/04/11  3:08 AM      Component Value Range Status Comment   MRSA by PCR NEGATIVE  NEGATIVE  Final   RESPIRATORY VIRUS PANEL (18 COMPONENTS)     Status: Abnormal   Collection Time   06/04/11  6:09 AM      Component Value Range Status Comment   Source - RVPAN NASAL MUCOSA   Final    Respiratory Syncytial Virus A NOT DETECTED   Final    Respiratory Syncytial Virus B NOT DETECTED   Final    Influenza A NOT DETECTED   Final    Influenza B NOT DETECTED   Final    Parainfluenza 1 NOT DETECTED   Final    Parainfluenza 2 NOT DETECTED   Final    Parainfluenza 3 DETECTED (*)  Final    Parainfluenza 4 NOT DETECTED   Final    Metapneumovirus NOT DETECTED   Final    Cox[place]ie and Echovirus NOT DETECTED   Final    Rhinovirus NOT DETECTED   Final    Adenovirus B NOT DETECTED   Final    Adenovirus E NOT DETECTED   Final    CoronavirusNL63 NOT DETECTED   Final    CoronavirusHKU1 NOT DETECTED   Final    Coronavirus229E NOT DETECTED   Final    CoronavirusOC43 NOT DETECTED   Final   CULTURE, EXPECTORATED SPUTUM-ASSESSMENT     Status: Normal   Collection Time   06/04/11  7:48 AM      Component Value Range Status Comment   Specimen Description SPUTUM   Final    Special Requests NONE   Final    Sputum  evaluation     Final    Value: MICROSCOPIC FINDINGS SUGGEST THAT THIS SPECIMEN IS NOT REPRESENTATIVE OF LOWER RESPIRATORY SECRETIONS. PLEASE RECOLLECT.     CALLED TO D ORTIZ,RN 06/04/11 0911 BY K SCHULTZ   Report Status 06/04/2011 FINAL   Final   AFB CULTURE WITH SMEAR     Status: Normal (Preliminary result)   Collection Time   06/04/11 10:30 AM      Component Value Range Status Comment   Specimen Description BRONCHIAL WASHINGS   Final    Special Requests NONE   Final    ACID FAST SMEAR NO ACID FAST BACILLI SEEN   Final    Culture     Final    Value: CULTURE WILL BE EXAMINED FOR 6 WEEKS BEFORE ISSUING A FINAL REPORT   Report Status PENDING   Incomplete   PNEUMOCYSTIS JIROVECI SMEAR BY DFA  Status: Normal   Collection Time   06/04/11 10:30 AM      Component Value Range Status Comment   Specimen Source-[facility] BRONCHIAL WASHINGS   Final    Pneumocystis jiroveci Ag NEGATIVE   Final Performed at [facility]  FUNGUS CULTURE W SMEAR     Status: Normal (Preliminary result)   Collection Time   06/04/11 10:30 AM      Component Value Range Status Comment   Specimen Description BRONCHIAL WASHINGS   Final    Special Requests NONE   Final    Fungal Smear NO YEAST OR FUNGAL ELEMENTS SEEN   Final    Culture CULTURE IN PROGRESS FOR FOUR WEEKS   Final    Report Status PENDING   Incomplete   CULTURE, RESPIRATORY     Status: Normal (Preliminary result)   Collection Time   06/04/11 10:30 AM      Component Value Range Status Comment   Specimen Description BRONCHIAL WASHINGS   Final    Special Requests NONE   Final    Gram Stain     Final    Value: FEW WBC PRESENT,BOTH PMN AND MONONUCLEAR     NO SQUAMOUS EPITHELIAL CELLS SEEN     NO ORGANISMS SEEN   Culture PENDING   Incomplete    Report Status PENDING   Incomplete   CULTURE, BLOOD (ROUTINE X 2)     Status: Normal (Preliminary result)   Collection Time   06/04/11  3:15 PM      Component Value Range Status Comment   Specimen Description  BLOOD RIGHT HAND   Final    Special Requests BOTTLES DRAWN AEROBIC AND ANAEROBIC 10CC   Final    Culture  Setup Time [id number]   Final    Culture     Final    Value:        BLOOD CULTURE RECEIVED NO GROWTH TO DATE CULTURE WILL BE HELD FOR 5 DAYS BEFORE ISSUING A FINAL NEGATIVE REPORT   Report Status PENDING   Incomplete   CULTURE, BLOOD (ROUTINE X 2)     Status: Normal (Preliminary result)   Collection Time   06/04/11  3:27 PM      Component Value Range Status Comment   Specimen Description BLOOD LEFT HAND   Final    Special Requests BOTTLES DRAWN AEROBIC ONLY 3CC   Final    Culture  Setup Time [id number]   Final    Culture     Final    Value:        BLOOD CULTURE RECEIVED NO GROWTH TO DATE CULTURE WILL BE HELD FOR 5 DAYS BEFORE ISSUING A FINAL NEGATIVE REPORT   Report Status PENDING   Incomplete     Studies/Results: Dg Chest Port 1 View  06/06/2011  *RADIOLOGY REPORT*  Clinical Data: Respiratory difficulty  PORTABLE CHEST - 1 VIEW  Comparison: Yesterday  Findings: Endotracheal tube tip remains just above the carina.  NG tube and left internal jugular center venous catheter are stable. Severe cardiomegaly unchanged.  Diffuse edema has improved. Persistent central basilar hazy airspace disease.  Pleural effusions may be present and are small.  IMPRESSION: Overall improved volume overload.  Stable support structures.  Original Report Authenticated By: Donavan Burnet, M.D.   Dg Chest Port 1 View  06/05/2011  *RADIOLOGY REPORT*  Clinical Data: Shortness of breath, ventilator.  PORTABLE CHEST - 1 VIEW  Comparison: 06/04/2011  Findings: Support devices are unchanged.  Cardiomegaly  with vascular congestion and diffuse interstitial opacities, likely interstitial edema.  Bibasilar atelectasis.  No definite effusions. Slight improved lung volumes since prior study.  IMPRESSION: Continued interstitial edema and bibasilar atelectasis.  Slight increase in lung volumes since prior study.  Original Report  Authenticated By: Cyndie Chime, M.D.   Dg Chest Port 1 View  06/04/2011  *RADIOLOGY REPORT*  Clinical Data: Left internal jugular central venous catheter placement.  Endotracheal tube repositioning.  PORTABLE CHEST - 1 VIEW/2013 1135 hours:  Comparison: Portable chest x-ray earlier same date 1030 hours.  Findings: Interval left internal jugular central venous catheter placement with its tip in the upper SVC.  No evidence of pneumothorax or mediastinal hematoma.  Endotracheal tube withdrawn slightly such that its tip is in satisfactory position approximately 4-5 cm above the carina.  Nasogastric tube courses below the diaphragm into the stomach.  Markedly suboptimal inspiration.  Cardiac silhouette enlarged but stable.  Airspace consolidation at the lung bases on the left upper lobe unchanged. Pulmonary venous hypertension, unchanged.  IMPRESSION:  1.  Left internal jugular central venous catheter tip in the upper SVC.  No acute complicating features. 2.  Endotracheal tube tip now in satisfactory position approximately 4-5 cm above the carina. 3.  Asymmetric airspace pulmonary edema versus pneumonia, left greater than right, as noted earlier.  Original Report Authenticated By: Arnell Sieving, M.D.   Dg Chest Port 1 View  06/04/2011  *RADIOLOGY REPORT*  Clinical Data: Endotracheal tube placement.  PORTABLE CHEST - 1 VIEW  Comparison: 06/03/2010.  Findings: Endotracheal tube tip just above the carina.  This may be retracted by 2 cm to avoid mainstem bronchus intubation with change of the patient's neck position.  Nasogastric tube courses below the diaphragm.  The tip is not included on this exam.  Interval development of consolidation lung bases greater on the left and patchy opacification left upper lobe.  This may represent infectious infiltrate.  Asymmetric edema also a consideration.  Cardiomegaly.  Limit evaluation of the aorta.  IMPRESSION: Endotracheal tube tip just above the carina.  This may be  retracted by 2 cm to avoid mainstem bronchus intubation with change of the patient's neck position.  Nasogastric tube courses below the diaphragm.  The tip is not included on this exam.  Interval development of consolidation lung bases greater on the left and patchy opacification left upper lobe.  This may represent infectious infiltrate.  Asymmetric edema also a consideration.  Cardiomegaly.  This has been made a PRA call report utilizing dashboard call feature.  Original Report Authenticated By: Fuller Canada, M.D.     Assessment/Plan: Pneumonia/sepsis Renal transplant/immunosuppresion Prainfluenza on viral swab. Day 3 anbx Will taper anbx to vanco/cefepime. This could be paraflu, cannot rule out superinfection due to the extensive nature of his pneumonia His fungal stain is negative (Ab panel pending). afb stain negative as well. PCP stain is negative.  BCx are negative.  CMV negative on whole blood- not sure this completely rules out CMV in lung.   Johny Sax Infectious Diseases [phone] 06/06/2011, 8:47 AM   LOS: 3 days

## 2011-06-06 NOTE — Consult Note (Addendum)
Name: Chad [name] MRN: [id number] DOB: 05-01-1955    LOS: 3  PCCM ADMISSION NOTE  History of Present Illness: [name] s/p renal Tx, immune suppressed, with 3 days of cough, shortness of breath and fever. He has been on immunosuppressants for his renal transplant. N/Vom noted at home.Admitted to sdu, declines requiring emergent intubation, near arrest. Emergent bronch done, bloody secretions and pus throughout.  Lines / Drains: Left ij 5/8>>> ETT 5/8>>> Aline 5/8>>>  Cultures: BAL bronch 5/8>>> Bronch  Afb>>>neg   Viralpanel>>>neg except for parainfluenza POS   Pcp>>>   Fungus>>>neg BC 5/7>>> Sputum 5/7>>> Urine 5/7>>>contaminated  Antibiotics: 5/7 vanc>>> 5/7 levoflox>>> 5/7 myco>>> 5/7 bactrim / steroids>>>  Tests / Events: 5/8 hypoxic resp failure, intubated, near arrest 5/9- no pressors needed, resting on vent, pos balance 5/10 borderline bp  Vital Signs: Temp:  [97.9 F (36.6 C)-98.8 F (37.1 C)] 98.1 F (36.7 C) (05/10 0600) Pulse Rate:  [101-222] 128  (05/10 0600) Resp:  [11-34] 24  (05/10 0600) BP: (70-99)/(50-76) 84/60 mmHg (05/10 0600) SpO2:  [88 %-96 %] 90 % (05/10 0600) Arterial Line BP: (68-98)/(57-80) 69/62 mmHg (05/10 0600) FiO2 (%):  [40 %-50 %] 50 % (05/10 0310) Weight:  [143.5 kg (316 lb 5.8 oz)] 143.5 kg (316 lb 5.8 oz) (05/10 0500) I/O last 3 completed shifts: In: 7768 [I.V.:1688; NG/GT:520; IV Piggyback:5560] Out: 985 [Urine:985]  Physical Examination: General:  Calm on vent Neuro:  Nonfocal HEENT:  Line clean Neck:  jvd increased Cardiovascular:  ST S2 S2 rrr Lungs:  ronchi crackles Abdomen:  Soft, bs wnl no r/g Lower ext- severe chronic edema  Ventilator settings: Vent Mode:  [-] PRVC FiO2 (%):  [40 %-50 %] 50 % Set Rate:  [16 bmp-22 bmp] 16 bmp Vt Set:  [450 mL] 450 mL PEEP:  [5 cmH20-10 cmH20] 10 cmH20 Plateau Pressure:  [15 cmH20-17 cmH20] 17 cmH20  Labs and Imaging:  Reviewed.  Please refer to the Assessment and  Plan section for relevant results.  Assessment and Plan: 1. Pulmonary   ARDS PNA, immune suppressed Acute respiratory failure -wean cpap 10 ps 5, goal 2 hours -goal back to peep 5 after diuresis  -please note on bronch ETT gets hung p on a thick ring left and difficult to advance passed this, keep ETT at 1.5 above carina and not pull back if able to avoid -last echo pulm htn, kvo now , remains normortensive -pos over 4liters , need lasix to keep even today, will rely on renal for dosing  2. CVS One bout SVT  With distress pre intubation, NO fib noted Sepsis, severe -borderline BP, dc propofol -unable to give cardizem with corrent BP, dc -echo reviewed pulm pressurs 50  3. Renal Renal TX, ATN -cvp up 16 -NEED lasix to even balance with all volume with meds Will dos enow , hope renal will detrmine further needs Chem in am   4. ID PNA Immunocommprommsied host Empiric pcp, HCAP, fungal, atypical  coverage, see dashboard -follow bronch resultto final -Per ID, appreciate recs, can we dc myco? -concentrate what we can  -follow pcp, steroids to remain, hope to dc bactrim asap when neg as high volume with this drug Leg, strep neg  4. Heme Leukocytosis, low plat sepsis dilution likely Sub q hep, follow trend improved  5. Endo cbg Steroids for pcp, at risk rel AI  6. MIld agitation -dc propofol -fent  7. Malnurished -start TF  -ppi -lft noted -oxepa (ARDS)  8. H/o  Hep C -dc statin coags wnl  Best practices / Disposition: -->ICU status under PCCM -->full code -->Heparin for DVT Px sub q -->Protonix for GI Px -->ventilator bundle -->diet  The patient is critically ill with multiple organ systems failure and requires high complexity decision making for assessment and support, frequent evaluation and titration of therapies, application of advanced monitoring technologies and extensive interpretation of multiple databases. Critical Care Time devoted to patient care  services described in this note is 30 minutes.  [name] 06/06/2011, 7:09 AM  Mcarthur Rossetti. Tyson Alias, MD, FACP Pgr: [phone] [facility]

## 2011-06-07 ENCOUNTER — Inpatient Hospital Stay (HOSPITAL_COMMUNITY): Payer: BC Managed Care – PPO

## 2011-06-07 DIAGNOSIS — T8610 Unspecified complication of kidney transplant: Secondary | ICD-10-CM

## 2011-06-07 DIAGNOSIS — N289 Disorder of kidney and ureter, unspecified: Secondary | ICD-10-CM

## 2011-06-07 LAB — CULTURE, RESPIRATORY W GRAM STAIN

## 2011-06-07 LAB — PHOSPHORUS: Phosphorus: 5.9 mg/dL — ABNORMAL HIGH (ref 2.3–4.6)

## 2011-06-07 LAB — COMPREHENSIVE METABOLIC PANEL
Albumin: 2.6 g/dL — ABNORMAL LOW (ref 3.5–5.2)
Alkaline Phosphatase: 44 U/L (ref 39–117)
BUN: 111 mg/dL — ABNORMAL HIGH (ref 6–23)
CO2: 20 mEq/L (ref 19–32)
Chloride: 93 mEq/L — ABNORMAL LOW (ref 96–112)
Creatinine, Ser: 3.65 mg/dL — ABNORMAL HIGH (ref 0.50–1.35)
GFR calc Af Amer: 20 mL/min — ABNORMAL LOW (ref >90)
GFR calc non Af Amer: 17 mL/min — ABNORMAL LOW (ref >90)
Glucose, Bld: 126 mg/dL — ABNORMAL HIGH (ref 70–99)
Potassium: 4.4 mEq/L (ref 3.5–5.1)
Total Bilirubin: 0.4 mg/dL (ref 0.3–1.2)

## 2011-06-07 LAB — POCT I-STAT 3, ART BLOOD GAS (G3+)
Bicarbonate: 23.7 mEq/L (ref 20.0–24.0)
Bicarbonate: 18 mEq/L — ABNORMAL LOW (ref 20.0–24.0)
TCO2: 19 mmol/L (ref 0–100)
pCO2 arterial: 35.7 mmHg (ref 35.0–45.0)
pH, Arterial: 7.44 (ref 7.350–7.450)
pH, Arterial: 7.311 — ABNORMAL LOW (ref 7.350–7.450)
pO2, Arterial: 72 mmHg — ABNORMAL LOW (ref 80.0–100.0)

## 2011-06-07 LAB — DIFFERENTIAL
Basophils Absolute: 0 10*3/uL (ref 0.0–0.1)
Basophils Absolute: 0 10*3/uL (ref 0.0–0.1)
Eosinophils Relative: 0 % (ref 0–5)
Lymphocytes Relative: 3 % — ABNORMAL LOW (ref 12–46)
Lymphocytes Relative: 6 % — ABNORMAL LOW (ref 12–46)
Lymphs Abs: 1 10*3/uL (ref 0.7–4.0)
Neutro Abs: 14.6 10*3/uL — ABNORMAL HIGH (ref 1.7–7.7)
Neutrophils Relative %: 94 % — ABNORMAL HIGH (ref 43–77)
Neutrophils Relative %: 92 % — ABNORMAL HIGH (ref 43–77)

## 2011-06-07 LAB — GLUCOSE, CAPILLARY
Glucose-Capillary: 132 mg/dL — ABNORMAL HIGH (ref 70–99)
Glucose-Capillary: 128 mg/dL — ABNORMAL HIGH (ref 70–99)
Glucose-Capillary: 121 mg/dL — ABNORMAL HIGH (ref 70–99)
Glucose-Capillary: 132 mg/dL — ABNORMAL HIGH (ref 70–99)

## 2011-06-07 LAB — CBC
MCV: 87.1 fL (ref 78.0–100.0)
Platelets: 108 10*3/uL — ABNORMAL LOW (ref 150–400)
Platelets: 88 10*3/uL — ABNORMAL LOW (ref 150–400)
RBC: 3.55 MIL/uL — ABNORMAL LOW (ref 4.22–5.81)
RDW: 16.6 % — ABNORMAL HIGH (ref 11.5–15.5)
RDW: 16.6 % — ABNORMAL HIGH (ref 11.5–15.5)
WBC: 15.3 10*3/uL — ABNORMAL HIGH (ref 4.0–10.5)
WBC: 15.9 10*3/uL — ABNORMAL HIGH (ref 4.0–10.5)

## 2011-06-07 LAB — TRIGLYCERIDES: Triglycerides: 96 mg/dL (ref <150)

## 2011-06-07 LAB — CARDIAC PANEL(CRET KIN+CKTOT+MB+TROPI)
Relative Index: 2.1 (ref 0.0–2.5)
Total CK: 138 U/L (ref 7–232)

## 2011-06-07 MED ORDER — HALOPERIDOL LACTATE 5 MG/ML IJ SOLN
INTRAMUSCULAR | Status: AC
  Filled 2011-06-07: qty 1

## 2011-06-07 MED ORDER — PANTOPRAZOLE SODIUM 40 MG PO PACK
40.0000 mg | PACK | Freq: Every day | ORAL | Status: DC
  Administered 2011-06-07 – 2011-06-11 (×5): 40 mg
  Filled 2011-06-07 (×5): qty 20

## 2011-06-07 MED ORDER — SODIUM CHLORIDE 0.9 % IV BOLUS (SEPSIS)
1000.0000 mL | Freq: Once | INTRAVENOUS | Status: AC
  Administered 2011-06-07: 1000 mL via INTRAVENOUS

## 2011-06-07 MED ORDER — PHENYLEPHRINE HCL 10 MG/ML IJ SOLN
30.0000 ug/min | INTRAVENOUS | Status: DC
  Administered 2011-06-07: 30 ug/min via INTRAVENOUS
  Filled 2011-06-07: qty 4

## 2011-06-07 MED ORDER — PROPOFOL 10 MG/ML IV EMUL
5.0000 ug/kg/min | INTRAVENOUS | Status: DC
  Administered 2011-06-07: 5 ug/kg/min via INTRAVENOUS
  Filled 2011-06-07: qty 100

## 2011-06-07 MED ORDER — HALOPERIDOL LACTATE 5 MG/ML IJ SOLN
5.0000 mg | Freq: Once | INTRAMUSCULAR | Status: AC
  Administered 2011-06-07: 5 mg via INTRAVENOUS

## 2011-06-07 MED ORDER — METHYLPREDNISOLONE SODIUM SUCC 40 MG IJ SOLR
40.0000 mg | INTRAMUSCULAR | Status: DC
  Administered 2011-06-07: 40 mg via INTRAVENOUS
  Filled 2011-06-07: qty 1

## 2011-06-07 MED ORDER — MIDAZOLAM HCL 2 MG/2ML IJ SOLN
2.0000 mg | INTRAMUSCULAR | Status: DC | PRN
  Administered 2011-06-07: 4 mg via INTRAVENOUS
  Filled 2011-06-07: qty 4

## 2011-06-07 MED ORDER — SODIUM CHLORIDE 0.9 % IV SOLN
1.0000 mg/h | INTRAVENOUS | Status: DC
  Administered 2011-06-07: 2 mg/h via INTRAVENOUS
  Administered 2011-06-08 – 2011-06-09 (×2): 3 mg/h via INTRAVENOUS
  Filled 2011-06-07 (×3): qty 10

## 2011-06-07 NOTE — Progress Notes (Addendum)
Name: Chad [name] MRN: [id number] DOB: 1955-05-06    LOS: 4  PCCM  NOTE  History of Present Illness: [name] s/p renal Tx, immune suppressed, with 3 days of cough, shortness of breath and fever. He has been on immunosuppressants for his renal transplant. N/Vom noted at home.Admitted to sdu, declines requiring emergent intubation, near arrest. Emergent bronch done, bloody secretions and pus throughout.  Lines / Drains: Left ij 5/8>>> ETT 5/8>>> Aline 5/8>>>out  Cultures: BAL bronch 5/8>>> Bronch  Afb>>>neg   Viralpanel>>>neg except for parainfluenza POS   Pcp>>>neg   Fungus>>>neg BC 5/7>>>ng Sputum 5/7>>>poor spec Urine 5/7>>>contaminated  Antibiotics: 5/7 vanc>>> 5/7 levoflox>>>5/10 5/7 myco>>>5/10 5/7 bactrim >>>5/10 5/10 cefepime >>  Tests / Events: 5/8 hypoxic resp failure, intubated, near arrest 5/9- no pressors needed, resting on vent, pos balance 5/10 borderline bp   Subj - afebrile, denies pain Vital Signs: Temp:  [96.6 F (35.9 C)-98.4 F (36.9 C)] 96.6 F (35.9 C) (05/11 0700) Pulse Rate:  [75-146] 121  (05/11 0700) Resp:  [11-34] 13  (05/11 0700) BP: (61-111)/(41-74) 81/63 mmHg (05/11 0700) SpO2:  [90 %-97 %] 96 % (05/11 0700) Arterial Line BP: (57-88)/(53-82) 69/64 mmHg (05/10 1200) FiO2 (%):  [50 %-60 %] 50 % (05/11 0756) Weight:  [144.8 kg (319 lb 3.6 oz)] 144.8 kg (319 lb 3.6 oz) (05/11 0500) I/O last 3 completed shifts: In: 4769 [I.V.:1025; NG/GT:1280; IV Piggyback:2464] Out: 434 [Urine:434]  Physical Examination: General:  Calm on vent Neuro:  Nonfocal HEENT:  Line clean Neck:  jvd increased Cardiovascular:  ST S2 S2 rrr Lungs:  ronchi crackles Abdomen:  Soft, bs wnl no r/g Lower ext- severe chronic edema  Ventilator settings: Vent Mode:  [-] PRVC FiO2 (%):  [50 %-60 %] 50 % Set Rate:  [16 bmp] 16 bmp Vt Set:  [450 mL] 450 mL PEEP:  [8 cmH20-10 cmH20] 8 cmH20 Plateau Pressure:  [8 cmH20-20 cmH20] 15 cmH20  Labs and  Imaging:    CBC    Component Value Date/Time   WBC 15.3* 06/07/2011 0500   RBC 3.71* 06/07/2011 0500   HGB 10.9* 06/07/2011 0500   HCT 32.3* 06/07/2011 0500   PLT 108* 06/07/2011 0500   MCV 87.1 06/07/2011 0500   MCH 29.4 06/07/2011 0500   MCHC 33.7 06/07/2011 0500   RDW 16.6* 06/07/2011 0500   LYMPHSABS 0.5* 06/07/2011 0500   MONOABS 0.4 06/07/2011 0500   EOSABS 0.0 06/07/2011 0500   BASOSABS 0.0 06/07/2011 0500    BMET    Component Value Date/Time   NA 131* 06/07/2011 0500   K 4.4 06/07/2011 0500   CL 93* 06/07/2011 0500   CO2 20 06/07/2011 0500   GLUCOSE 126* 06/07/2011 0500   BUN 111* 06/07/2011 0500   CREATININE 3.65* 06/07/2011 0500   CALCIUM 7.6* 06/07/2011 0500   GFRNONAA 17* 06/07/2011 0500   GFRAA 20* 06/07/2011 0500      Assessment and Plan: 1. Pulmonary   ARDS PNA, immune suppressed Acute respiratory failure Plan - -lowering PEEP/FIO2,goal back to peep 5 after diuresis  -please note on bronch ETT gets hung p on a thick ring left and difficult to advance passed this, keep ETT at 1.5 above carina and not pull back if able to avoid -last echo pulm htn, kvo now , remains normortensive -pos over 4liters , need lasix to keep even today, will rely on renal for dosing  2. [facility] One bout SVT  With distress pre intubation, NO fib noted Sepsis,  severe -borderline BP, off propofol -unable to give cardizem with current BP, dc -echo reviewed pulm pressurs 50   3. Renal Renal TX, ATN -cvp up 14 -No response to albumin + lasix  5/10- hold further lasix due to low bp, rising bun/cr  4. ID PNA Immunocommprommsied host Empiric  HCAP - vanc cefepime  -follow bronch result to final -Per ID - ?steroids to remain, off bactrim  Leg, strep neg -rechk pct  4. Heme Leukocytosis, low plat sepsis dilution likely Sub q hep, follow trend improved  5. Endo cbg Steroids for pcp - decrease to stress doses, at risk rel AI  6. MIld agitation -dc propofol -fent  7. Malnurished -on  TF  -ppi -lft noted -oxepa (ARDS)  8. H/o Hep C -dc statin coags wnl  Best practices / Disposition: -->ICU status under PCCM -->full code -->Heparin for DVT Px sub q -->Protonix for GI Px -->ventilator bundle -->tfs  The patient is critically ill with multiple organ systems failure and requires high complexity decision making for assessment and support, frequent evaluation and titration of therapies, application of advanced monitoring technologies and extensive interpretation of multiple databases. Critical Care Time devoted to patient care services described in this note is 30 minutes.  [name] V. 06/07/2011, 8:00 AM

## 2011-06-07 NOTE — Progress Notes (Signed)
eLink Physician-Brief Progress Note Patient Name: Chad [name] DOB: 1956/01/08 MRN: [id number]  Date of Service  06/07/2011   HPI/Events of Note  Restless v anxious v agitaterd. RN says RASS +2 but CAM-ICU negative per report  eICU Interventions  Haldol 5mg  IV x 1   Intervention Category Major Interventions: Delirium, psychosis, severe agitation - evaluation and management  Gissell Barra 06/07/2011, 4:36 PM

## 2011-06-07 NOTE — Progress Notes (Signed)
[facility] ROUNDING NOTE   Subjective:   Interval History: none.  Objective:  Vital signs in last 24 hours:  Temp:  [96.4 F (35.8 C)-97.9 F (36.6 C)] 96.6 F (35.9 C) (05/11 0900) Pulse Rate:  [86-131] 124  (05/11 1222) Resp:  [11-26] 26  (05/11 1222) BP: (61-94)/(41-66) 78/62 mmHg (05/11 1222) SpO2:  [89 %-97 %] 92 % (05/11 1222) FiO2 (%):  [40 %-60 %] 50 % (05/11 1300) Weight:  [144.8 kg (319 lb 3.6 oz)] 144.8 kg (319 lb 3.6 oz) (05/11 0500)  Weight change: 1.3 kg (2 lb 13.9 oz) Filed Weights   06/04/11 0321 06/06/11 0500 06/07/11 0500  Weight: 132.7 kg (292 lb 8.8 oz) 143.5 kg (316 lb 5.8 oz) 144.8 kg (319 lb 3.6 oz)    Intake/Output: I/O last 3 completed shifts: In: 4799 [I.V.:1055; NG/GT:1280; IV Piggyback:2464] Out: 434 [Urine:434]   Intake/Output this shift:  Total I/O In: 65 [I.V.:35; NG/GT:30] Out: 40 [Urine:40]   Neuro: Nonfocal   Neck: supple Cardiovascular: RRR Lungs: ronchi  Abdomen: Soft, bs wnl no r/g  Lower ext- severe chronic edema    Basic Metabolic Panel:  Lab 06/07/11 1610 06/06/11 0500 06/05/11 0435 06/04/11 1741 06/04/11 0505  NA 131* 129* 134* 129* 134*  K 4.4 3.9 4.1 4.0 4.0  CL 93* 92* 96 94* 95*  CO2 20 22 24 24 28   GLUCOSE 126* 124* 126* 140* 99  BUN 111* 87* 68* 63* 64*  CREATININE 3.65* 2.78* 2.29* 2.06* 2.19*  CALCIUM 7.6* 7.5* 7.8* -- --  MG -- -- -- -- --  PHOS 5.9* 4.7* 3.9 -- --    Liver Function Tests:  Lab 06/07/11 0500 06/06/11 0500 06/05/11 0435 06/04/11 0505 06/03/11 2255  AST 34 32 36 49* 55*  ALT 16 16 16 23 26   ALKPHOS 44 36* 31* 47 53  BILITOT 0.4 0.4 0.6 1.0 1.0  PROT 6.1 6.1 5.9* 6.9 7.5  ALBUMIN 2.6* 2.3* 2.2* 2.9* 3.2*    Lab 06/03/11 2255  LIPASE 55  AMYLASE --   No results found for this basename: AMMONIA:3 in the last 168 hours  CBC:  Lab 06/07/11 0500 06/06/11 0500 06/05/11 0435 06/04/11 0505 06/03/11 2255  WBC 15.3* 18.0* 11.5* 15.8* 17.6*  NEUTROABS 14.4* 16.6*  10.5* 11.7* 13.6*  HGB 10.9* 11.4* 11.4* 13.3 13.9  HCT 32.3* 33.8* 35.0* 40.7 42.1  MCV 87.1 86.2 89.3 90.8 90.7  PLT 108* 92* 83* 112* 116*    Cardiac Enzymes:  Lab 06/04/11 1410 06/03/11 2326  CKTOTAL 163 185  CKMB 2.1 1.4  CKMBINDEX -- --  TROPONINI <0.30 <0.30    BNP: No components found with this basename: POCBNP:5  CBG:  Lab 06/07/11 1128 06/07/11 0729 06/07/11 0439 06/07/11 0001 06/06/11 1958  GLUCAP 128* 126* 132* 130* 141*    Microbiology: Results for orders placed during the hospital encounter of 06/03/11  URINE CULTURE     Status: Normal   Collection Time   06/03/11 10:53 PM      Component Value Range Status Comment   Specimen Description URINE, RANDOM   Final    Special Requests ADDED [id number]   Final    Culture  Setup Time [id number]   Final    Colony Count >=100,000 COLONIES/ML   Final    Culture     Final    Value: Multiple bacterial morphotypes present, none predominant. Suggest appropriate recollection if clinically indicated.   Report Status 06/05/2011 FINAL   Final  CULTURE, BLOOD (ROUTINE X 2)     Status: Normal (Preliminary result)   Collection Time   06/03/11 11:45 PM      Component Value Range Status Comment   Specimen Description BLOOD RIGHT ARM   Final    Special Requests BOTTLES DRAWN AEROBIC AND ANAEROBIC 10CC   Final    Culture  Setup Time [id number]   Final    Culture     Final    Value:        BLOOD CULTURE RECEIVED NO GROWTH TO DATE CULTURE WILL BE HELD FOR 5 DAYS BEFORE ISSUING A FINAL NEGATIVE REPORT   Report Status PENDING   Incomplete   CULTURE, BLOOD (ROUTINE X 2)     Status: Normal (Preliminary result)   Collection Time   06/03/11 11:50 PM      Component Value Range Status Comment   Specimen Description BLOOD RIGHT ARM   Final    Special Requests     Final    Value: BOTTLES DRAWN AEROBIC AND ANAEROBIC 5CC AEROBOC 3CC ANAEROBIC   Culture  Setup Time [id number]   Final    Culture     Final    Value:        BLOOD CULTURE  RECEIVED NO GROWTH TO DATE CULTURE WILL BE HELD FOR 5 DAYS BEFORE ISSUING A FINAL NEGATIVE REPORT   Report Status PENDING   Incomplete   MRSA PCR SCREENING     Status: Normal   Collection Time   06/04/11  3:08 AM      Component Value Range Status Comment   MRSA by PCR NEGATIVE  NEGATIVE  Final   RESPIRATORY VIRUS PANEL (18 COMPONENTS)     Status: Abnormal   Collection Time   06/04/11  6:09 AM      Component Value Range Status Comment   Source - RVPAN NASAL MUCOSA   Final    Respiratory Syncytial Virus A NOT DETECTED   Final    Respiratory Syncytial Virus B NOT DETECTED   Final    Influenza A NOT DETECTED   Final    Influenza B NOT DETECTED   Final    Parainfluenza 1 NOT DETECTED   Final    Parainfluenza 2 NOT DETECTED   Final    Parainfluenza 3 DETECTED (*)  Final    Parainfluenza 4 NOT DETECTED   Final    Metapneumovirus NOT DETECTED   Final    Cox[place]ie and Echovirus NOT DETECTED   Final    Rhinovirus NOT DETECTED   Final    Adenovirus B NOT DETECTED   Final    Adenovirus E NOT DETECTED   Final    CoronavirusNL63 NOT DETECTED   Final    CoronavirusHKU1 NOT DETECTED   Final    Coronavirus229E NOT DETECTED   Final    CoronavirusOC43 NOT DETECTED   Final   CULTURE, EXPECTORATED SPUTUM-ASSESSMENT     Status: Normal   Collection Time   06/04/11  7:48 AM      Component Value Range Status Comment   Specimen Description SPUTUM   Final    Special Requests NONE   Final    Sputum evaluation     Final    Value: MICROSCOPIC FINDINGS SUGGEST THAT THIS SPECIMEN IS NOT REPRESENTATIVE OF LOWER RESPIRATORY SECRETIONS. PLEASE RECOLLECT.     CALLED TO D ORTIZ,RN 06/04/11 0911 BY K SCHULTZ   Report Status 06/04/2011 FINAL   Final   AFB CULTURE WITH SMEAR  Status: Normal (Preliminary result)   Collection Time   06/04/11 10:30 AM      Component Value Range Status Comment   Specimen Description BRONCHIAL WASHINGS   Final    Special Requests NONE   Final    ACID FAST SMEAR NO ACID FAST BACILLI SEEN    Final    Culture     Final    Value: CULTURE WILL BE EXAMINED FOR 6 WEEKS BEFORE ISSUING A FINAL REPORT   Report Status PENDING   Incomplete   PNEUMOCYSTIS JIROVECI SMEAR BY DFA     Status: Normal   Collection Time   06/04/11 10:30 AM      Component Value Range Status Comment   Specimen Source-PJSRC BRONCHIAL WASHINGS   Final    Pneumocystis jiroveci Ag NEGATIVE   Final Performed at [facility]  FUNGUS CULTURE W SMEAR     Status: Normal (Preliminary result)   Collection Time   06/04/11 10:30 AM      Component Value Range Status Comment   Specimen Description BRONCHIAL WASHINGS   Final    Special Requests NONE   Final    Fungal Smear NO YEAST OR FUNGAL ELEMENTS SEEN   Final    Culture CULTURE IN PROGRESS FOR FOUR WEEKS   Final    Report Status PENDING   Incomplete   CULTURE, RESPIRATORY     Status: Normal   Collection Time   06/04/11 10:30 AM      Component Value Range Status Comment   Specimen Description BRONCHIAL WASHINGS   Final    Special Requests NONE   Final    Gram Stain     Final    Value: FEW WBC PRESENT,BOTH PMN AND MONONUCLEAR     NO SQUAMOUS EPITHELIAL CELLS SEEN     NO ORGANISMS SEEN   Culture NO GROWTH 2 DAYS   Final    Report Status 06/07/2011 FINAL   Final   HSV PCR     Status: Normal   Collection Time   06/04/11 10:30 AM      Component Value Range Status Comment   HSV, PCR Not Detected  Not Detected  Final    HSV 2 , PCR Not Detected  Not Detected  Final    Specimen Source-HSVPCR NO GROWTH   Final   CULTURE, BLOOD (ROUTINE X 2)     Status: Normal (Preliminary result)   Collection Time   06/04/11  3:15 PM      Component Value Range Status Comment   Specimen Description BLOOD RIGHT HAND   Final    Special Requests BOTTLES DRAWN AEROBIC AND ANAEROBIC 10CC   Final    Culture  Setup Time [id number]   Final    Culture     Final    Value:        BLOOD CULTURE RECEIVED NO GROWTH TO DATE CULTURE WILL BE HELD FOR 5 DAYS BEFORE ISSUING A FINAL NEGATIVE  REPORT   Report Status PENDING   Incomplete   CULTURE, BLOOD (ROUTINE X 2)     Status: Normal (Preliminary result)   Collection Time   06/04/11  3:27 PM      Component Value Range Status Comment   Specimen Description BLOOD LEFT HAND   Final    Special Requests BOTTLES DRAWN AEROBIC ONLY [facility]   Final    Culture  Setup Time [id number]   Final    Culture     Final  Value:        BLOOD CULTURE RECEIVED NO GROWTH TO DATE CULTURE WILL BE HELD FOR 5 DAYS BEFORE ISSUING A FINAL NEGATIVE REPORT   Report Status PENDING   Incomplete     Coagulation Studies: No results found for this basename: LABPROT:5,INR:5 in the last 72 hours  Urinalysis: No results found for this basename: COLORURINE:2,APPERANCEUR:2,LABSPEC:2,PHURINE:2,GLUCOSEU:2,HGBUR:2,BILIRUBINUR:2,KETONESUR:2,PROTEINUR:2,UROBILINOGEN:2,NITRITE:2,LEUKOCYTESUR:2 in the last 72 hours    Imaging: Dg Chest Port 1 View  06/07/2011  *RADIOLOGY REPORT*  Clinical Data: Respiratory failure  PORTABLE CHEST - 1 VIEW  Comparison: 06/06/2011  Findings: Endotracheal tube tip approximately 2 cm above the carina.  Central line positioning stable.  Stable cardiomegaly.  No overt pulmonary edema.  Atelectasis involving both lower lobes is relatively stable.  IMPRESSION: Stable bilateral lower lobe atelectasis.  No overt pulmonary edema.  Original Report Authenticated By: Reola Calkins, M.D.   Dg Chest Port 1 View  06/06/2011  *RADIOLOGY REPORT*  Clinical Data: Respiratory difficulty  PORTABLE CHEST - 1 VIEW  Comparison: Yesterday  Findings: Endotracheal tube tip remains just above the carina.  NG tube and left internal jugular center venous catheter are stable. Severe cardiomegaly unchanged.  Diffuse edema has improved. Persistent central basilar hazy airspace disease.  Pleural effusions may be present and are small.  IMPRESSION: Overall improved volume overload.  Stable support structures.  Original Report Authenticated By: Donavan Burnet, M.D.      Medications:      . sodium chloride 20 mL/hr at 06/05/11 0841  . dextrose    . fentaNYL infusion INTRAVENOUS 50 mcg/hr (06/07/11 0000)      . albumin human  25 g Intravenous Once  . antiseptic oral rinse  15 mL Mouth Rinse QID  . calcitRIOL  0.5 mcg Oral Q T,Th,S,Su  . calcitRIOL  1 mcg Oral Q M,W,F  . ceFEPime (MAXIPIME) IV  1 g Intravenous Q12H  . chlorhexidine  15 mL Mouth Rinse BID  . collagenase   Topical Daily  . feeding supplement (OXEPA)  1,000 mL Per Tube Q24H  . feeding supplement  30 mL Oral BID WC  . furosemide  160 mg Intravenous Q6H  . heparin subcutaneous  5,000 Units Subcutaneous Q8H  . insulin aspart  0-4 Units Subcutaneous Q4H  . methylPREDNISolone (SOLU-MEDROL) injection  40 mg Intravenous Q24H  . mulitivitamin  5 mL Per Tube Daily  . pantoprazole sodium  40 mg Per Tube Q1200  . sirolimus  0.75 mg Oral Daily  . vancomycin  1,750 mg Intravenous Q24H  . DISCONTD: feeding supplement  30 mL Per Tube 5 X Daily  . DISCONTD: methylPREDNISolone (SOLU-MEDROL) injection  40 mg Intravenous Q12H  . DISCONTD: pantoprazole (PROTONIX) IV  40 mg Intravenous Q24H   albuterol, fentaNYL, fentaNYL  Assessment/ Plan:   Acute renal failure s/p pneumonia and hypotension. Now with worsening renal function and oliguria.I suspect this is ATN and hemodynamic in origin. An Echo would be recommended  HTN /Vol  This is very tricky to assess this morning. Certainly Xray suggests edema and lower extremities are chronically swollen  Electrolytes stable  Anemia stable  I have discussed the care with Dr Vassie Loll and agree with holding lasix. He may need pressors  LOS: 4 Chad [name] W @TODAY @1 :21 PM

## 2011-06-07 NOTE — Progress Notes (Signed)
eLink Physician-Brief Progress Note Patient Name: Chad [name] DOB: December 22, 1955 MRN: [id number]  Date of Service  06/07/2011   HPI/Events of Note   RN calling that patient RR 40 and hyperventilating on ventilator despite bedside MD Dr Blinda Leatherwood adding versed prn to fent gtt 1h ago  eICU Interventions  Get stat cxr staart diprivan gtt   Intervention Category Major Interventions: Respiratory failure - evaluation and management  Danne Vasek 06/07/2011, 8:28 PM

## 2011-06-07 NOTE — Progress Notes (Addendum)
eLink Physician-Brief Progress Note Patient Name: Chad [name] DOB: [birth year] MRN: [id number]  Date of Service  06/07/2011   HPI/Events of Note  Hypotensive again   Stopped diprivan due to low bp Started versed gtt Continue fent gtt Start phenylephrine  eICU Interventions     Intervention Category Major Interventions: Hypotension - evaluation and management  [name] 06/07/2011, 11:05 PM

## 2011-06-07 NOTE — Progress Notes (Addendum)
Patient received albumin followed by lasix 160mg  per Renal. Patient had only 45cc urine in response.Foley has been flushed to check patency. PCCM Deterding MD made aware. Doree Albee    MD Allena Katz made aware of minimal urine output.

## 2011-06-07 NOTE — Progress Notes (Addendum)
eLink Physician-Brief Progress Note Patient Name: Chad [name] DOB: 04/20/1955 MRN: [id number]  Date of Service  06/07/2011   HPI/Events of Note   Hypotensive after diprivan  eICU Interventions  Fluid bolus and reassess Check labs   Addendum 10:27 PM  Labs show rising lactate and rising creat Now on 100% fio2 BP respponded to fluids  Will check abg; might be getting septic   Intervention Category Major Interventions: Hypotension - evaluation and management  Dorothy Polhemus 06/07/2011, 9:19 PM

## 2011-06-08 ENCOUNTER — Inpatient Hospital Stay (HOSPITAL_COMMUNITY): Payer: BC Managed Care – PPO

## 2011-06-08 DIAGNOSIS — A419 Sepsis, unspecified organism: Principal | ICD-10-CM

## 2011-06-08 DIAGNOSIS — I059 Rheumatic mitral valve disease, unspecified: Secondary | ICD-10-CM

## 2011-06-08 LAB — COMPREHENSIVE METABOLIC PANEL
Albumin: 2.8 g/dL — ABNORMAL LOW (ref 3.5–5.2)
Alkaline Phosphatase: 54 U/L (ref 39–117)
BUN: 130 mg/dL — ABNORMAL HIGH (ref 6–23)
CO2: 16 mEq/L — ABNORMAL LOW (ref 19–32)
Chloride: 93 mEq/L — ABNORMAL LOW (ref 96–112)
Creatinine, Ser: 4.17 mg/dL — ABNORMAL HIGH (ref 0.50–1.35)
GFR calc Af Amer: 17 mL/min — ABNORMAL LOW (ref >90)
GFR calc non Af Amer: 15 mL/min — ABNORMAL LOW (ref >90)
Glucose, Bld: 137 mg/dL — ABNORMAL HIGH (ref 70–99)
Total Bilirubin: 0.7 mg/dL (ref 0.3–1.2)

## 2011-06-08 LAB — POCT I-STAT 3, ART BLOOD GAS (G3+)
Acid-base deficit: 8 mmol/L — ABNORMAL HIGH (ref 0.0–2.0)
Bicarbonate: 17 mEq/L — ABNORMAL LOW (ref 20.0–24.0)
O2 Saturation: 89 %
O2 Saturation: 94 %
Patient temperature: 36.5
Patient temperature: 98
TCO2: 19 mmol/L (ref 0–100)
TCO2: 18 mmol/L (ref 0–100)
pCO2 arterial: 38.5 mmHg (ref 35.0–45.0)
pCO2 arterial: 35.5 mmHg (ref 35.0–45.0)
pH, Arterial: 7.287 — ABNORMAL LOW (ref 7.350–7.450)
pO2, Arterial: 73 mmHg — ABNORMAL LOW (ref 80.0–100.0)

## 2011-06-08 LAB — APTT: aPTT: 29 seconds (ref 24–37)

## 2011-06-08 LAB — CBC
HCT: 37.6 % — ABNORMAL LOW (ref 39.0–52.0)
MCV: 87.6 fL (ref 78.0–100.0)
Platelets: 119 10*3/uL — ABNORMAL LOW (ref 150–400)
RBC: 4.29 MIL/uL (ref 4.22–5.81)
WBC: 23.9 10*3/uL — ABNORMAL HIGH (ref 4.0–10.5)

## 2011-06-08 LAB — GLUCOSE, CAPILLARY
Glucose-Capillary: 122 mg/dL — ABNORMAL HIGH (ref 70–99)
Glucose-Capillary: 150 mg/dL — ABNORMAL HIGH (ref 70–99)
Glucose-Capillary: 151 mg/dL — ABNORMAL HIGH (ref 70–99)

## 2011-06-08 LAB — BASIC METABOLIC PANEL
CO2: 18 mEq/L — ABNORMAL LOW (ref 19–32)
Calcium: 6.9 mg/dL — ABNORMAL LOW (ref 8.4–10.5)
Chloride: 90 mEq/L — ABNORMAL LOW (ref 96–112)
Creatinine, Ser: 4.5 mg/dL — ABNORMAL HIGH (ref 0.50–1.35)
Glucose, Bld: 172 mg/dL — ABNORMAL HIGH (ref 70–99)

## 2011-06-08 LAB — CARBOXYHEMOGLOBIN
Carboxyhemoglobin: 0.7 % (ref 0.5–1.5)
Carboxyhemoglobin: 1.1 % (ref 0.5–1.5)
O2 Saturation: 49.9 %
O2 Saturation: 68.5 %
Total hemoglobin: 12.6 g/dL — ABNORMAL LOW (ref 13.5–18.0)
Total hemoglobin: 12.7 g/dL — ABNORMAL LOW (ref 13.5–18.0)

## 2011-06-08 LAB — CARDIAC PANEL(CRET KIN+CKTOT+MB+TROPI)
CK, MB: 6 ng/mL — ABNORMAL HIGH (ref 0.3–4.0)
Total CK: 247 U/L — ABNORMAL HIGH (ref 7–232)
Troponin I: 0.93 ng/mL (ref <0.30)

## 2011-06-08 LAB — PROTIME-INR: INR: 1.27 (ref 0.00–1.49)

## 2011-06-08 LAB — TYPE AND SCREEN: Antibody Screen: NEGATIVE

## 2011-06-08 LAB — PROCALCITONIN
Procalcitonin: 4.91 ng/mL
Procalcitonin: 4.95 ng/mL

## 2011-06-08 MED ORDER — VASOPRESSIN 20 UNIT/ML IJ SOLN
0.0300 [IU]/min | INTRAVENOUS | Status: DC | PRN
  Administered 2011-06-08 – 2011-06-11 (×4): 0.03 [IU]/min via INTRAVENOUS
  Filled 2011-06-08 (×6): qty 2.5

## 2011-06-08 MED ORDER — HEPARIN SODIUM (PORCINE) 1000 UNIT/ML DIALYSIS
1000.0000 [IU] | INTRAMUSCULAR | Status: DC | PRN
  Administered 2011-06-08: 2200 [IU] via INTRAVENOUS_CENTRAL
  Administered 2011-06-09 (×3): 1000 [IU] via INTRAVENOUS_CENTRAL
  Filled 2011-06-08: qty 1
  Filled 2011-06-08: qty 6
  Filled 2011-06-08: qty 1
  Filled 2011-06-08: qty 6
  Filled 2011-06-08: qty 1

## 2011-06-08 MED ORDER — PRISMASOL BGK 4/2.5 32-4-2.5 MEQ/L IV SOLN
INTRAVENOUS | Status: DC
  Administered 2011-06-08 – 2011-06-22 (×100): via INTRAVENOUS_CENTRAL
  Filled 2011-06-08 (×144): qty 5000

## 2011-06-08 MED ORDER — HEPARIN (PORCINE) 2000 UNITS/L FOR CRRT
INTRAVENOUS_CENTRAL | Status: DC | PRN
  Filled 2011-06-08: qty 1000

## 2011-06-08 MED ORDER — AMIODARONE HCL IN DEXTROSE 360-4.14 MG/200ML-% IV SOLN: 60.0000 mg/h | INTRAVENOUS | Status: DC

## 2011-06-08 MED ORDER — DEXTROSE 5 % IV SOLN
2.0000 g | Freq: Two times a day (BID) | INTRAVENOUS | Status: AC
  Administered 2011-06-08 – 2011-06-11 (×7): 2 g via INTRAVENOUS
  Filled 2011-06-08 (×7): qty 2

## 2011-06-08 MED ORDER — PHENYLEPHRINE HCL 10 MG/ML IJ SOLN
30.0000 ug/min | INTRAVENOUS | Status: DC
  Administered 2011-06-08: 180 ug/min via INTRAVENOUS
  Administered 2011-06-08: 200 ug/min via INTRAVENOUS
  Administered 2011-06-08: 190 ug/min via INTRAVENOUS
  Filled 2011-06-08 (×5): qty 4

## 2011-06-08 MED ORDER — PIPERACILLIN-TAZOBACTAM 3.375 G IVPB 30 MIN
3.3750 g | Freq: Once | INTRAVENOUS | Status: AC
  Administered 2011-06-08: 3.375 g via INTRAVENOUS
  Filled 2011-06-08: qty 50

## 2011-06-08 MED ORDER — AMIODARONE LOAD VIA INFUSION
150.0000 mg | Freq: Once | INTRAVENOUS | Status: AC
  Administered 2011-06-08: 150 mg via INTRAVENOUS
  Filled 2011-06-08: qty 83.34

## 2011-06-08 MED ORDER — DIGOXIN 0.25 MG/ML IJ SOLN
0.2500 mg | Freq: Once | INTRAMUSCULAR | Status: AC
  Administered 2011-06-08: 0.25 mg via INTRAVENOUS
  Filled 2011-06-08: qty 1

## 2011-06-08 MED ORDER — PIPERACILLIN-TAZOBACTAM 3.375 G IVPB
3.3750 g | Freq: Three times a day (TID) | INTRAVENOUS | Status: DC
  Administered 2011-06-08: 3.375 g via INTRAVENOUS
  Filled 2011-06-08 (×2): qty 50

## 2011-06-08 MED ORDER — HYDROCORTISONE SOD SUCCINATE 100 MG IJ SOLR
50.0000 mg | Freq: Four times a day (QID) | INTRAMUSCULAR | Status: DC
  Administered 2011-06-08 – 2011-06-11 (×15): 50 mg via INTRAVENOUS
  Filled 2011-06-08 (×17): qty 1

## 2011-06-08 MED ORDER — PRISMASOL BGK 4/2.5 32-4-2.5 MEQ/L IV SOLN
INTRAVENOUS | Status: DC
  Administered 2011-06-08 – 2011-06-11 (×3): via INTRAVENOUS_CENTRAL
  Filled 2011-06-08 (×5): qty 5000

## 2011-06-08 MED ORDER — PRISMASOL BGK 4/2.5 32-4-2.5 MEQ/L IV SOLN
INTRAVENOUS | Status: DC
  Administered 2011-06-08 – 2011-06-09 (×2): via INTRAVENOUS_CENTRAL
  Filled 2011-06-08 (×5): qty 5000

## 2011-06-08 MED ORDER — NOREPINEPHRINE BITARTRATE 1 MG/ML IJ SOLN
2.0000 ug/min | INTRAVENOUS | Status: DC | PRN
  Filled 2011-06-08: qty 16

## 2011-06-08 MED ORDER — SODIUM BICARBONATE 8.4 % IV SOLN
INTRAVENOUS | Status: AC
  Administered 2011-06-08: 100 meq via INTRAVENOUS
  Filled 2011-06-08: qty 100

## 2011-06-08 MED ORDER — PHENYLEPHRINE HCL 10 MG/ML IJ SOLN
30.0000 ug/min | INTRAVENOUS | Status: DC | PRN
  Filled 2011-06-08: qty 4

## 2011-06-08 MED ORDER — HEPARIN SODIUM (PORCINE) 1000 UNIT/ML IJ SOLN
INTRAMUSCULAR | Status: AC
  Administered 2011-06-08: 2200 [IU] via INTRAVENOUS_CENTRAL
  Filled 2011-06-08: qty 3

## 2011-06-08 MED ORDER — MICAFUNGIN SODIUM 50 MG IV SOLR
100.0000 mg | Freq: Every day | INTRAVENOUS | Status: DC
  Administered 2011-06-08 – 2011-06-10 (×3): 100 mg via INTRAVENOUS
  Filled 2011-06-08 (×4): qty 100

## 2011-06-08 MED ORDER — SODIUM CHLORIDE 0.9 % IV BOLUS (SEPSIS): 500.0000 mL | Freq: Once | INTRAVENOUS | Status: DC

## 2011-06-08 MED ORDER — DOBUTAMINE IN D5W 4-5 MG/ML-% IV SOLN
2.5000 ug/kg/min | INTRAVENOUS | Status: DC | PRN
  Administered 2011-06-08: 2.5 ug/kg/min via INTRAVENOUS
  Filled 2011-06-08: qty 250

## 2011-06-08 MED ORDER — MILRINONE IN DEXTROSE 200-5 MCG/ML-% IV SOLN
0.2500 ug/kg/min | INTRAVENOUS | Status: DC
  Administered 2011-06-08 – 2011-06-13 (×11): 0.25 ug/kg/min via INTRAVENOUS
  Filled 2011-06-08 (×12): qty 100

## 2011-06-08 MED ORDER — SODIUM BICARBONATE 8.4 % IV SOLN
100.0000 meq | Freq: Once | INTRAVENOUS | Status: AC
  Administered 2011-06-08: 100 meq via INTRAVENOUS

## 2011-06-08 MED ORDER — AMIODARONE HCL IN DEXTROSE 360-4.14 MG/200ML-% IV SOLN
INTRAVENOUS | Status: AC
  Administered 2011-06-08: 60 mg/h
  Filled 2011-06-08: qty 200

## 2011-06-08 MED ORDER — NOREPINEPHRINE BITARTRATE 1 MG/ML IJ SOLN
2.0000 ug/min | INTRAVENOUS | Status: DC
  Administered 2011-06-08: 5 ug/min via INTRAVENOUS
  Filled 2011-06-08 (×2): qty 4

## 2011-06-08 MED ORDER — AMIODARONE HCL IN DEXTROSE 360-4.14 MG/200ML-% IV SOLN: 30.0000 mg/h | INTRAVENOUS | Status: DC

## 2011-06-08 MED ORDER — SODIUM CHLORIDE 0.9 % IV BOLUS (SEPSIS): 500.0000 mL | INTRAVENOUS | Status: DC | PRN

## 2011-06-08 NOTE — Progress Notes (Signed)
ANTIBIOTIC CONSULT NOTE - INITIAL  Pharmacy Consult for Vancomycin Indication: Sepsis: Broad coverage of PNA in immunocompromised patient  No Known Allergies  Patient Measurements: Height: 5\' 11"  (180.3 cm) Weight: 322 lb 12.1 oz (146.4 kg) IBW/kg (Calculated) : 75.3   Vital Signs: Temp: 98.4 F (36.9 C) (05/12 1230) BP: 122/83 mmHg (05/12 1230) Pulse Rate: 64  (05/12 1230) Intake/Output from previous day: 05/11 0701 - 05/12 0700 In: 3334.5 [I.V.:1604.5; NG/GT:780; IV Piggyback:950] Out: 261 [Urine:261] Intake/Output from this shift: Total I/O In: 726.3 [I.V.:488.8; NG/GT:150; IV Piggyback:87.5] Out: 0   Labs:  Basename 06/08/11 1100 06/08/11 0230 06/07/11 2105 06/07/11 0500  WBC -- 23.9* 15.9* 15.3*  HGB -- 12.6* 10.5* 10.9*  PLT -- 119* 88* 108*  LABCREA -- -- -- --  CREATININE 4.50* 4.17* -- 3.65*   Estimated Creatinine Clearance: 27.5 ml/min (by C-G formula based on Cr of 4.5).  Microbiology: Recent Results (from the past 720 hour(s))  URINE CULTURE     Status: Normal   Collection Time   06/03/11 10:53 PM      Component Value Range Status Comment   Specimen Description URINE, RANDOM   Final    Special Requests ADDED [id number]   Final    Culture  Setup Time [id number]   Final    Colony Count >=100,000 COLONIES/ML   Final    Culture     Final    Value: Multiple bacterial morphotypes present, none predominant. Suggest appropriate recollection if clinically indicated.   Report Status 06/05/2011 FINAL   Final   CULTURE, BLOOD (ROUTINE X 2)     Status: Normal (Preliminary result)   Collection Time   06/03/11 11:45 PM      Component Value Range Status Comment   Specimen Description BLOOD RIGHT ARM   Final    Special Requests BOTTLES DRAWN AEROBIC AND ANAEROBIC 10CC   Final    Culture  Setup Time [id number]   Final    Culture     Final    Value:        BLOOD CULTURE RECEIVED NO GROWTH TO DATE CULTURE WILL BE HELD FOR 5 DAYS BEFORE ISSUING A FINAL NEGATIVE  REPORT   Report Status PENDING   Incomplete   CULTURE, BLOOD (ROUTINE X 2)     Status: Normal (Preliminary result)   Collection Time   06/03/11 11:50 PM      Component Value Range Status Comment   Specimen Description BLOOD RIGHT ARM   Final    Special Requests     Final    Value: BOTTLES DRAWN AEROBIC AND ANAEROBIC 5CC AEROBOC 3CC ANAEROBIC   Culture  Setup Time [id number]   Final    Culture     Final    Value:        BLOOD CULTURE RECEIVED NO GROWTH TO DATE CULTURE WILL BE HELD FOR 5 DAYS BEFORE ISSUING A FINAL NEGATIVE REPORT   Report Status PENDING   Incomplete   MRSA PCR SCREENING     Status: Normal   Collection Time   06/04/11  3:08 AM      Component Value Range Status Comment   MRSA by PCR NEGATIVE  NEGATIVE  Final   RESPIRATORY VIRUS PANEL (18 COMPONENTS)     Status: Abnormal   Collection Time   06/04/11  6:09 AM      Component Value Range Status Comment   Source - RVPAN NASAL MUCOSA   Final    Respiratory Syncytial Virus A  NOT DETECTED   Final    Respiratory Syncytial Virus B NOT DETECTED   Final    Influenza A NOT DETECTED   Final    Influenza B NOT DETECTED   Final    Parainfluenza 1 NOT DETECTED   Final    Parainfluenza 2 NOT DETECTED   Final    Parainfluenza 3 DETECTED (*)  Final    Parainfluenza 4 NOT DETECTED   Final    Metapneumovirus NOT DETECTED   Final    [place]ox[place] and Echovirus NOT DETECTED   Final    Rhinovirus NOT DETECTED   Final    Adenovirus B NOT DETECTED   Final    Adenovirus E NOT DETECTED   Final    CoronavirusNL63 NOT DETECTED   Final    CoronavirusHKU1 NOT DETECTED   Final    Coronavirus229E NOT DETECTED   Final    CoronavirusOC43 NOT DETECTED   Final   CULTURE, EXPECTORATED SPUTUM-ASSESSMENT     Status: Normal   Collection Time   06/04/11  7:48 AM      Component Value Range Status Comment   Specimen Description SPUTUM   Final    Special Requests NONE   Final    Sputum evaluation     Final    Value: MICROSCOPIC FINDINGS SUGGEST THAT THIS  SPECIMEN IS NOT REPRESENTATIVE OF LOWER RESPIRATORY SECRETIONS. PLEASE RECOLLECT.     CALLED TO D ORTIZ,RN 06/04/11 0911 BY K SCHULTZ   Report Status 06/04/2011 FINAL   Final   AFB CULTURE WITH SMEAR     Status: Normal (Preliminary result)   Collection Time   06/04/11 10:30 AM      Component Value Range Status Comment   Specimen Description BRONCHIAL WASHINGS   Final    Special Requests NONE   Final    ACID FAST SMEAR NO ACID FAST BACILLI SEEN   Final    Culture     Final    Value: CULTURE WILL BE EXAMINED FOR 6 WEEKS BEFORE ISSUING A FINAL REPORT   Report Status PENDING   Incomplete   PNEUMOCYSTIS JIROVECI SMEAR BY DFA     Status: Normal   Collection Time   06/04/11 10:30 AM      Component Value Range Status Comment   Specimen Source-PJSRC BRONCHIAL WASHINGS   Final    Pneumocystis jiroveci Ag NEGATIVE   Final Performed at [facility]  FUNGUS CULTURE W SMEAR     Status: Normal (Preliminary result)   Collection Time   06/04/11 10:30 AM      Component Value Range Status Comment   Specimen Description BRONCHIAL WASHINGS   Final    Special Requests NONE   Final    Fungal Smear NO YEAST OR FUNGAL ELEMENTS SEEN   Final    Culture CULTURE IN PROGRESS FOR FOUR WEEKS   Final    Report Status PENDING   Incomplete   CULTURE, RESPIRATORY     Status: Normal   Collection Time   06/04/11 10:30 AM      Component Value Range Status Comment   Specimen Description BRONCHIAL WASHINGS   Final    Special Requests NONE   Final    Gram Stain     Final    Value: FEW WBC PRESENT,BOTH PMN AND MONONUCLEAR     NO SQUAMOUS EPITHELIAL CELLS SEEN     NO ORGANISMS SEEN   Culture NO GROWTH 2 DAYS   Final  Report Status 06/07/2011 FINAL   Final   HSV PCR     Status: Normal   Collection Time   06/04/11 10:30 AM      Component Value Range Status Comment   HSV, PCR Not Detected  Not Detected  Final    HSV 2 , PCR Not Detected  Not Detected  Final    Specimen Source-HSVPCR NO GROWTH   Final   CULTURE,  BLOOD (ROUTINE X 2)     Status: Normal (Preliminary result)   Collection Time   06/04/11  3:15 PM      Component Value Range Status Comment   Specimen Description BLOOD RIGHT HAND   Final    Special Requests BOTTLES DRAWN AEROBIC AND ANAEROBIC 10CC   Final    Culture  Setup Time [id number]   Final    Culture     Final    Value:        BLOOD CULTURE RECEIVED NO GROWTH TO DATE CULTURE WILL BE HELD FOR 5 DAYS BEFORE ISSUING A FINAL NEGATIVE REPORT   Report Status PENDING   Incomplete   CULTURE, BLOOD (ROUTINE X 2)     Status: Normal (Preliminary result)   Collection Time   06/04/11  3:27 PM      Component Value Range Status Comment   Specimen Description BLOOD LEFT HAND   Final    Special Requests BOTTLES DRAWN AEROBIC ONLY 3CC   Final    Culture  Setup Time [id number]   Final    Culture     Final    Value:        BLOOD CULTURE RECEIVED NO GROWTH TO DATE CULTURE WILL BE HELD FOR 5 DAYS BEFORE ISSUING A FINAL NEGATIVE REPORT   Report Status PENDING   Incomplete     Medical History: Past Medical History  Diagnosis Date  . Hypertension   . H/O kidney transplant     Assessment: 56 yo M emergently intubated and bronchoscopy performed for respiratory failure 5/8.  Pt continues on broad spectrum antibiotics. Mycamine resumed last PM s/p sepsis reactivation. Scr has been steadily increasing, noted supratherapeutic Vanc trough this AM. Noted plans to start CVVHD today.  Goal of Therapy:  resolution of infection; Vanco trough 15-20 mcg/ml  Plan:  1.  Change Cefepime to 2gm IV q 12h starting tonight. 2. Hold off on giving further Vanc doses, cancel random level for this afternoon, check random level in AM to determine when to resume q24h doses while on CRRT. 3. Will monitor cx/sens, renal fn and clinical status daily.  Jacquiline Zurcher K. Allena Katz, PharmD, BCPS.  Clinical Pharmacist Pager [phone]. 06/08/2011 3:32 PM

## 2011-06-08 NOTE — Progress Notes (Signed)
56yo male now called as code sepsis to add Zosyn to ABX regimen.  Will begin Zosyn 3.375g IV Q8H for CrCl ~33 ml/min.  Vernard Gambles, PharmD, BCPS 06/08/2011 1:50 AM

## 2011-06-08 NOTE — Progress Notes (Addendum)
Name: Chad [name] MRN: [id number] DOB: [birth year]    LOS: 5  PCCM  NOTE  History of Present Illness: [name] s/p renal Tx, immune suppressed, with 3 days of cough, shortness of breath and fever. He has been on immunosuppressants for his renal transplant. N/Vom noted at home.Admitted to sdu, declines requiring emergent intubation, near arrest. Emergent bronch done, bloody secretions and pus throughout.  Lines / Drains: Left ij 5/8>>> ETT 5/8>>> Aline 5/8>>>out  Cultures: BAL bronch 5/8>>>ng Bronch  Afb>>>neg   Viralpanel>>>neg except for parainfluenza POS   Pcp>>>neg   Fungus>>>neg HSV pcr neg BC 5/7>>>ng Sputum 5/7>>>poor spec Urine 5/7>>>contaminated  Antibiotics: 5/7 vanc>>> 5/7 levoflox>>>5/10 5/7 myco>>>5/10  restart 5/11 (worsening) >> 5/7 bactrim >>>5/10 5/10 cefepime >>  Tests / Events: 5/8 hypoxic resp failure, intubated, near arrest 5/9- no pressors needed, resting on vent, pos balance 5/10 borderline bp 5/11 worsening shock , on sepsis protocol + dobut for low co-ox   Subj - tachy 140's, on neo gtt @ 180, sedated  Vital Signs: Temp:  [96.6 F (35.9 C)-98.2 F (36.8 C)] 97.7 F (36.5 C) (05/12 0800) Pulse Rate:  [34-229] 67  (05/12 0800) Resp:  [12-35] 21  (05/12 0800) BP: (46-109)/(29-79) 93/72 mmHg (05/12 0800) SpO2:  [81 %-97 %] 86 % (05/12 0800) Arterial Line BP: (71-117)/(42-76) 86/54 mmHg (05/12 0800) FiO2 (%):  [40 %-100 %] 100 % (05/12 0700) Weight:  [146.4 kg (322 lb 12.1 oz)] 146.4 kg (322 lb 12.1 oz) (05/12 0456) I/O last 3 completed shifts: In: 4815.5 [I.V.:2009.5; NG/GT:1140; IV Piggyback:1666] Out: 366 [Urine:366]  Physical Examination: General:  Asynchronous on vent Neuro:  Nonfocal, sedated HEENT:  Line clean Neck:  jvd increased Cardiovascular:  ST S2 S2 rrr Lungs:  ronchi crackles Abdomen:  Soft, bs wnl no r/g Lower ext- severe chronic edema  Ventilator settings: Vent Mode:  [-] PRVC FiO2 (%):  [40 %-100 %] 100  % Set Rate:  [16 bmp] 16 bmp Vt Set:  [450 mL] 450 mL PEEP:  [8 cmH20-15 cmH20] 15 cmH20 Plateau Pressure:  [13 cmH20-22 cmH20] 17 cmH20  Labs and Imaging:    CBC    Component Value Date/Time   WBC 23.9* 06/08/2011 0230   RBC 4.29 06/08/2011 0230   HGB 12.6* 06/08/2011 0230   HCT 37.6* 06/08/2011 0230   PLT 119* 06/08/2011 0230   MCV 87.6 06/08/2011 0230   MCH 29.8 06/08/2011 0230   MCHC 34.0 06/08/2011 0230   RDW 17.0* 06/08/2011 0230   LYMPHSABS 1.0 06/07/2011 2105   MONOABS 0.3 06/07/2011 2105   EOSABS 0.0 06/07/2011 2105   BASOSABS 0.0 06/07/2011 2105    BMET    Component Value Date/Time   NA 131* 06/08/2011 0230   K 5.2* 06/08/2011 0230   CL 93* 06/08/2011 0230   CO2 16* 06/08/2011 0230   GLUCOSE 137* 06/08/2011 0230   BUN 130* 06/08/2011 0230   CREATININE 4.17* 06/08/2011 0230   CALCIUM 7.4* 06/08/2011 0230   GFRNONAA 15* 06/08/2011 0230   GFRAA 17* 06/08/2011 0230      Assessment and Plan: 1. Pulmonary   ARDS PNA, immune suppressed Acute respiratory failure Plan - -Back on ARDS protocol, 100%/+15 -please note on bronch ETT gets hung p on a thick ring left and difficult to advance passed this, keep ETT at 1.5 above carina and not pull back if able to avoid -last echo pulm htn, kvo now , remains normortensive -pos over 4liters   2. [facility]  bout SVT  With distress pre intubation, back in flutter now Septic shock, no effusion on echo , doubt PE --unable to give cardizem with current BP, dc dobutamine -echo reviewed pulm pressurs 50,await final reading on  stat echo for effusion, PA pr   3. Renal Renal TX, ATN, worsening acidosis & mild hyperkalemia -cvp high -No response to albumin + lasix  5/10- hold further lasix due to shock, rising bun/cr -repeat abg & BMET in 4 h, if worse may need CVVH  4. ID PNA Immunocommprommsied host Empiric  HCAP - vanc cefepime  -restarted anti fungal  -Per ID - ?steroids to remain, off bactrim  Leg, strep neg -pct 4.9  4.  Heme Leukocytosis, low plat sepsis dilution likely Sub q hep, follow trend improved  5. Endo cbg Steroids for pcp - decrease to stress doses, at risk rel AI  6. MIld agitation -dc propofol, versed/ fent ok -fent  7. Malnurished -on TF  -ppi -lft noted -oxepa (ARDS)  8. H/o Hep C -dc statin coags wnl  Best practices / Disposition: -->ICU status under PCCM -->full code -->Heparin for DVT Px sub q -->Protonix for GI Px -->ventilator bundle -->tfs  The patient is critically ill with multiple organ systems failure and requires high complexity decision making for assessment and support, frequent evaluation and titration of therapies, application of advanced monitoring technologies and extensive interpretation of multiple databases. Critical Care Time devoted to patient care services described in this note is 45 minutes.  Waymon Laser V. 06/08/2011, 8:26 AM

## 2011-06-08 NOTE — Progress Notes (Signed)
eLink Physician-Brief Progress Note Patient Name: Chad [name] DOB: 04/08/1955 MRN: [id number]  Date of Service  06/08/2011   HPI/Events of Note  Approximately 11pm the patient had a decline in status with hypotension and tachycardia.  Dr. Marchelle Gearing ordered lactate and PCT which had both increased.  WBC also increased.  Patient evaluated by Dr. Henderson [place] and placed on sepsis protocol.  Despite placement on NEO gtt BP remained low.  Vasopressin started but BP persistently low with intermittently perfused beats. COOX was low so despite tachycardia dobutamine was initiated.  This coincided with improvement in BP but HR increased.  Decision made to start amiodarone for HR.  Meanwhile patient recultured, ABX broadened.  By 5:15 BP improved at 108/76 with HR 133.   eICU Interventions  Plan: Continue with NEO/vasopressin/dobutamine for BP support Amio gtt for AF/RVR HC increased ABX coverage broadened   Intervention Category Major Interventions: Sepsis - evaluation and management;Shock - evaluation and management;Arrhythmia - evaluation and management  Chad [name] 06/08/2011, 5:08 AM

## 2011-06-08 NOTE — Progress Notes (Signed)
ANTIBIOTIC CONSULT NOTE - FOLLOW UP  Pharmacy Consult for vancomcyin Indication: rule out pneumonia and rule out sepsis  Labs:  Basename 06/08/11 0230 06/07/11 2105 06/07/11 0500 06/06/11 0500  WBC 23.9* 15.9* 15.3* --  HGB 12.6* 10.5* 10.9* --  PLT 119* 88* 108* --  LABCREA -- -- -- --  CREATININE 4.17* -- 3.65* 2.78*   Estimated Creatinine Clearance: 29.5 ml/min (by C-G formula based on Cr of 4.17).  Basename 06/08/11 0230  VANCOTROUGH 52.0*  VANCOPEAK --  VANCORANDOM --  GENTTROUGH --  GENTPEAK --  GENTRANDOM --  TOBRATROUGH --  TOBRAPEAK --  TOBRARND --  AMIKACINPEAK --  AMIKACINTROU --  AMIKACIN --    Assessment/Plan: 56yo obese male with worsening renal function supratherapeutic on vancomycin with trough >> goal.  Will obtain a random vanc level in 12hr to get a better idea of patient-specific pharmacokinetics, though may continue to change given renal function.  Colleen Can PharmD BCPS 06/08/2011,4:07 AM

## 2011-06-08 NOTE — Procedures (Signed)
Central Venous Catheter Insertion Procedure Note Chad [name] [id number] 1955-05-27  Procedure: Insertion of Central Venous Catheter Indications: hemodialysis, worsening acidosis & hyperkalemia, to start CVVH  Procedure Details Consent: Risks of procedure as well as the alternatives and risks of each were explained to the (patient/caregiver).  Consent for procedure obtained. Time Out: Verified patient identification, verified procedure, site/side was marked, verified correct patient position, special equipment/implants available, medications/allergies/relevent history reviewed, required imaging and test results available.  Performed  Maximum sterile technique was used including antiseptics, cap, gloves, gown, hand hygiene, mask and sheet. Skin prep: Chlorhexidine; local anesthetic administered A antimicrobial bonded/coated triple lumen catheter was placed in the right internal jugular vein using the Seldinger technique.  Evaluation Blood flow good Complications: No apparent complications Patient did tolerate procedure well. Chest X-ray ordered to verify placement.  CXR: pending.  Tara Rud V. 06/08/2011, 2:08 PM

## 2011-06-08 NOTE — Clinical Social Work Psychosocial (Signed)
    Clinical Social Work Department BRIEF PSYCHOSOCIAL ASSESSMENT 06/08/2011  Patient:  Chad [name], Chad [name]     Account Number:  [id number]     Admit date:  06/03/2011  Clinical Social Worker:  Lourdes Sledge  Date/Time:  06/08/2011 12:07 AM  Referred by:  Physician  Date Referred:  06/08/2011 Referred for  Advanced Directives   Other Referral:   Pt sedated and family wanting to know who is the decision maker for pt and whether they can complete advanced directives.   Interview type:  Family Other interview type:   CSW met with pt son Chad [name], pt fiance Chad [name] and pt friend.    PSYCHOSOCIAL DATA Living Status:  ALONE Admitted from facility:   Level of care:   Primary support name:  Chad [name] Primary support relationship to patient:  CHILD, ADULT Degree of support available:   Pt appears to have severa supportivel family and friends.    CURRENT CONCERNS Current Concerns  Other - See comment   Other Concerns:   Pt sedated and a decision maker for pt need to be appointed.    SOCIAL WORK ASSESSMENT / PLAN CSW received a call that pt is currently sedated and intubated and unable to make decisions for self. CSW met with pt son, fiance and family friend. CSW informed them that since pt does not have HCPOA paperwork completed pt next of kin (since pt is not legally married)would be pt son-only son Chad [name] who is 21 years old DOB 6/22. CSW informed family that if Chad [name] would not be agreeable pt sister would be next of kin. Chad [name] stated he feels capable if need be to make decisions for pt. however he will be discussing things with his mother and pt fiance, Chad [name]. CSW informed the HPOC/next of kin process and that staff could directly ask pt to designate a HCPOA once he becomes more alert. Pt fiance Chad [name] stated she would like to be involved in pt care and assist Stanley with decision making. CSW later contacted pt son Chad [name] over the phone to ensure that he did  feel comfortable with making decisions and did not feel pressured while family was around earlier. Chad [name] confirmed he is confortable with the role and though he wants his mother/pt fiance Chad [name] involved in pt care, he wants to be contacted by physicians and staff regarding pt care and not receive delayed information. CSW informed Chad [name] that CSW would document his request. CSW contacted RN and informed her that pt son Chad [name] is next of kin as HCPOA have not been done for pt. Weekday cSW to follow up and assist family with emotional support as needed.   Assessment/plan status:  Psychosocial Support/Ongoing Assessment of Needs Other assessment/ plan:   Information/referral to community resources:   CSW provided her contact information to family and informed them that an HCPOA packet was placed in shadow chart in the instance that pt becomes alert to designate HCPOA.    PATIENT'S/FAMILY'S RESPONSE TO PLAN OF CARE: Pt currently intubated and sedated. Pt family outside of the room. CSW met with pt son Chad [name], who is next of kin and will be decision maker for pt as pt is currently sedated and intubated.

## 2011-06-08 NOTE — Progress Notes (Signed)
  Echocardiogram 2D Echocardiogram has been performed.  Cathie Beams Deneen 06/08/2011, 9:08 AM

## 2011-06-08 NOTE — Consult Note (Signed)
Reason for Consult:Cardiomyopathy Referring Physician: CCM  Chad [name] is an 56 y.o. male.  HPI: This 56 year old man with past history of remote renal transplant and immunosuppression was admitted with worsening cough fever and dyspnea. Has been tachycardic since admission and EKG shows atrial flutter with rapid ventricular response. Now intubated, on pressors for shock of undermined etiology. Patient unable to supply any history.  Steffanie Rainwater present and states she was not aware of any history of heart problems.  He has had worsening peripheral edema felt to be secondary to worsening renal function.  No history of known coronary disease. 2D echo today showed severe hypokinesis with EF 15-20%. EKG today shows atrial flutter with rapid ventricular response and IVCD of RBBB type.  No acute ST segment changes. Cardiac enzymes are mildly elevated with troponin 0.59 and 0.93.   Past Medical History  Diagnosis Date  . Hypertension   . H/O kidney transplant     Past Surgical History  Procedure Date  . Nephrectomy transplanted organ     History reviewed. No pertinent family history.  Social History:  reports that he has never smoked. He does not have any smokeless tobacco history on file. He reports that he does not drink alcohol. His drug history not on file.  Allergies: No Known Allergies  Medications:  Scheduled:   . amiodarone (NEXTERONE PREMIX) 360 mg/200 mL dextrose      . amiodarone  150 mg Intravenous Once  . antiseptic oral rinse  15 mL Mouth Rinse QID  . calcitRIOL  1 mcg Oral Q M,W,F  . ceFEPime (MAXIPIME) IV  2 g Intravenous Q12H  . chlorhexidine  15 mL Mouth Rinse BID  . collagenase   Topical Daily  . feeding supplement (OXEPA)  1,000 mL Per Tube Q24H  . feeding supplement  30 mL Oral BID WC  . heparin subcutaneous  5,000 Units Subcutaneous Q8H  . hydrocortisone sod succinate (SOLU-CORTEF) injection  50 mg Intravenous Q6H  . insulin aspart  0-4 Units Subcutaneous  Q4H  . micafungin (MYCAMINE) IV  100 mg Intravenous Daily  . mulitivitamin  5 mL Per Tube Daily  . pantoprazole sodium  40 mg Per Tube Q1200  . piperacillin-tazobactam  3.375 g Intravenous Once  . sirolimus  0.75 mg Oral Daily  . sodium bicarbonate  100 mEq Intravenous Once  . sodium chloride  1,000 mL Intravenous Once  . sodium chloride  500 mL Intravenous Once  . DISCONTD: calcitRIOL  0.5 mcg Oral Q T,Th,S,Su  . DISCONTD: ceFEPime (MAXIPIME) IV  1 g Intravenous Q12H  . DISCONTD: methylPREDNISolone (SOLU-MEDROL) injection  40 mg Intravenous Q24H  . DISCONTD: piperacillin-tazobactam (ZOSYN)  IV  3.375 g Intravenous Q8H  . DISCONTD: vancomycin  1,750 mg Intravenous Q24H    Results for orders placed during the hospital encounter of 06/03/11 (from the past 48 hour(s))  GLUCOSE, CAPILLARY     Status: Abnormal   Collection Time   06/06/11  7:58 PM      Component Value Range Comment   Glucose-Capillary 141 (*) 70 - 99 (mg/dL)   GLUCOSE, CAPILLARY     Status: Abnormal   Collection Time   06/07/11 12:01 AM      Component Value Range Comment   Glucose-Capillary 130 (*) 70 - 99 (mg/dL)    Comment 1 Documented in Chart      Comment 2 Notify RN     GLUCOSE, CAPILLARY     Status: Abnormal   Collection Time  06/07/11  4:39 AM      Component Value Range Comment   Glucose-Capillary 132 (*) 70 - 99 (mg/dL)    Comment 1 Documented in Chart      Comment 2 Notify RN     COMPREHENSIVE METABOLIC PANEL     Status: Abnormal   Collection Time   06/07/11  5:00 AM      Component Value Range Comment   Sodium 131 (*) 135 - 145 (mEq/L)    Potassium 4.4  3.5 - 5.1 (mEq/L)    Chloride 93 (*) 96 - 112 (mEq/L)    CO2 20  19 - 32 (mEq/L)    Glucose, Bld 126 (*) 70 - 99 (mg/dL)    BUN 161 (*) 6 - 23 (mg/dL)    Creatinine, Ser 0.96 (*) 0.50 - 1.35 (mg/dL)    Calcium 7.6 (*) 8.4 - 10.5 (mg/dL)    Total Protein 6.1  6.0 - 8.3 (g/dL)    Albumin 2.6 (*) 3.5 - 5.2 (g/dL)    AST 34  0 - 37 (U/L)    ALT 16  0 -  53 (U/L)    Alkaline Phosphatase 44  39 - 117 (U/L)    Total Bilirubin 0.4  0.3 - 1.2 (mg/dL)    GFR calc non Af Amer 17 (*) >90 (mL/min)    GFR calc Af Amer 20 (*) >90 (mL/min)   CBC     Status: Abnormal   Collection Time   06/07/11  5:00 AM      Component Value Range Comment   WBC 15.3 (*) 4.0 - 10.5 (K/uL)    RBC 3.71 (*) 4.22 - 5.81 (MIL/uL)    Hemoglobin 10.9 (*) 13.0 - 17.0 (g/dL)    HCT 04.5 (*) 40.9 - 52.0 (%)    MCV 87.1  78.0 - 100.0 (fL)    MCH 29.4  26.0 - 34.0 (pg)    MCHC 33.7  30.0 - 36.0 (g/dL)    RDW 81.1 (*) 91.4 - 15.5 (%)    Platelets 108 (*) 150 - 400 (K/uL) CONSISTENT WITH PREVIOUS RESULT  DIFFERENTIAL     Status: Abnormal   Collection Time   06/07/11  5:00 AM      Component Value Range Comment   Neutrophils Relative 94 (*) 43 - 77 (%)    Neutro Abs 14.4 (*) 1.7 - 7.7 (K/uL)    Lymphocytes Relative 3 (*) 12 - 46 (%)    Lymphs Abs 0.5 (*) 0.7 - 4.0 (K/uL)    Monocytes Relative 3  3 - 12 (%)    Monocytes Absolute 0.4  0.1 - 1.0 (K/uL)    Eosinophils Relative 0  0 - 5 (%)    Eosinophils Absolute 0.0  0.0 - 0.7 (K/uL)    Basophils Relative 0  0 - 1 (%)    Basophils Absolute 0.0  0.0 - 0.1 (K/uL)   PHOSPHORUS     Status: Abnormal   Collection Time   06/07/11  5:00 AM      Component Value Range Comment   Phosphorus 5.9 (*) 2.3 - 4.6 (mg/dL)   PRO B NATRIURETIC PEPTIDE     Status: Abnormal   Collection Time   06/07/11  5:00 AM      Component Value Range Comment   Pro B Natriuretic peptide (BNP) 9971.0 (*) 0 - 125 (pg/mL)   TRIGLYCERIDES     Status: Normal   Collection Time   06/07/11  5:00 AM  Component Value Range Comment   Triglycerides 96  <150 (mg/dL)   GLUCOSE, CAPILLARY     Status: Abnormal   Collection Time   06/07/11  7:29 AM      Component Value Range Comment   Glucose-Capillary 126 (*) 70 - 99 (mg/dL)   GLUCOSE, CAPILLARY     Status: Abnormal   Collection Time   06/07/11 11:28 AM      Component Value Range Comment   Glucose-Capillary 128  (*) 70 - 99 (mg/dL)   GLUCOSE, CAPILLARY     Status: Abnormal   Collection Time   06/07/11  4:20 PM      Component Value Range Comment   Glucose-Capillary 121 (*) 70 - 99 (mg/dL)   GLUCOSE, CAPILLARY     Status: Abnormal   Collection Time   06/07/11  7:30 PM      Component Value Range Comment   Glucose-Capillary 132 (*) 70 - 99 (mg/dL)   CARDIAC PANEL(CRET KIN+CKTOT+MB+TROPI)     Status: Normal   Collection Time   06/07/11  9:05 PM      Component Value Range Comment   Total CK 138  7 - 232 (U/L)    CK, MB 2.9  0.3 - 4.0 (ng/mL)    Troponin I <0.30  <0.30 (ng/mL)    Relative Index 2.1  0.0 - 2.5    LACTIC ACID, PLASMA     Status: Abnormal   Collection Time   06/07/11  9:05 PM      Component Value Range Comment   Lactic Acid, Venous 2.3 (*) 0.5 - 2.2 (mmol/L)   CBC     Status: Abnormal   Collection Time   06/07/11  9:05 PM      Component Value Range Comment   WBC 15.9 (*) 4.0 - 10.5 (K/uL)    RBC 3.55 (*) 4.22 - 5.81 (MIL/uL)    Hemoglobin 10.5 (*) 13.0 - 17.0 (g/dL)    HCT 16.1 (*) 09.6 - 52.0 (%)    MCV 87.3  78.0 - 100.0 (fL)    MCH 29.6  26.0 - 34.0 (pg)    MCHC 33.9  30.0 - 36.0 (g/dL)    RDW 04.5 (*) 40.9 - 15.5 (%)    Platelets 88 (*) 150 - 400 (K/uL) CONSISTENT WITH PREVIOUS RESULT  DIFFERENTIAL     Status: Abnormal   Collection Time   06/07/11  9:05 PM      Component Value Range Comment   Neutrophils Relative 92 (*) 43 - 77 (%)    Neutro Abs 14.6 (*) 1.7 - 7.7 (K/uL)    Lymphocytes Relative 6 (*) 12 - 46 (%)    Lymphs Abs 1.0  0.7 - 4.0 (K/uL)    Monocytes Relative 2 (*) 3 - 12 (%)    Monocytes Absolute 0.3  0.1 - 1.0 (K/uL)    Eosinophils Relative 0  0 - 5 (%)    Eosinophils Absolute 0.0  0.0 - 0.7 (K/uL)    Basophils Relative 0  0 - 1 (%)    Basophils Absolute 0.0  0.0 - 0.1 (K/uL)   POCT I-STAT 3, BLOOD GAS (G3+)     Status: Abnormal   Collection Time   06/07/11 10:55 PM      Component Value Range Comment   pH, Arterial 7.311 (*) 7.350 - 7.450     pCO2  arterial 35.7  35.0 - 45.0 (mmHg)    pO2, Arterial 85.0  80.0 - 100.0 (mmHg)  Bicarbonate 18.0 (*) 20.0 - 24.0 (mEq/L)    TCO2 19  0 - 100 (mmol/L)    O2 Saturation 96.0      Acid-base deficit 7.0 (*) 0.0 - 2.0 (mmol/L)    Patient temperature 98.7 F      Collection site RADIAL, ALLEN'S TEST ACCEPTABLE      Drawn by RT      Sample type ARTERIAL     GLUCOSE, CAPILLARY     Status: Abnormal   Collection Time   06/07/11 11:38 PM      Component Value Range Comment   Glucose-Capillary 122 (*) 70 - 99 (mg/dL)   PROCALCITONIN     Status: Normal   Collection Time   06/07/11 11:45 PM      Component Value Range Comment   Procalcitonin 4.95     POCT I-STAT 3, BLOOD GAS (G3+)     Status: Abnormal   Collection Time   06/08/11  1:37 AM      Component Value Range Comment   pH, Arterial 7.297 (*) 7.350 - 7.450     pCO2 arterial 38.5  35.0 - 45.0 (mmHg)    pO2, Arterial 73.0 (*) 80.0 - 100.0 (mmHg)    Bicarbonate 18.8 (*) 20.0 - 24.0 (mEq/L)    TCO2 20  0 - 100 (mmol/L)    O2 Saturation 93.0      Acid-base deficit 7.0 (*) 0.0 - 2.0 (mmol/L)    Patient temperature 98.6 F      Collection site ARTERIAL LINE      Drawn by Operator      Sample type ARTERIAL     CBC     Status: Abnormal   Collection Time   06/08/11  2:30 AM      Component Value Range Comment   WBC 23.9 (*) 4.0 - 10.5 (K/uL)    RBC 4.29  4.22 - 5.81 (MIL/uL)    Hemoglobin 12.6 (*) 13.0 - 17.0 (g/dL)    HCT 40.9 (*) 81.1 - 52.0 (%)    MCV 87.6  78.0 - 100.0 (fL)    MCH 29.8  26.0 - 34.0 (pg)    MCHC 34.0  30.0 - 36.0 (g/dL)    RDW 91.4 (*) 78.2 - 15.5 (%)    Platelets 119 (*) 150 - 400 (K/uL) CONSISTENT WITH PREVIOUS RESULT  VANCOMYCIN, TROUGH     Status: Abnormal   Collection Time   06/08/11  2:30 AM      Component Value Range Comment   Vancomycin Tr 52.0 (*) 10.0 - 20.0 (ug/mL)   COMPREHENSIVE METABOLIC PANEL     Status: Abnormal   Collection Time   06/08/11  2:30 AM      Component Value Range Comment   Sodium 131 (*) 135  - 145 (mEq/L)    Potassium 5.2 (*) 3.5 - 5.1 (mEq/L)    Chloride 93 (*) 96 - 112 (mEq/L)    CO2 16 (*) 19 - 32 (mEq/L)    Glucose, Bld 137 (*) 70 - 99 (mg/dL)    BUN 956 (*) 6 - 23 (mg/dL) RESULT CONFIRMED BY AUTOMATED DILUTION   Creatinine, Ser 4.17 (*) 0.50 - 1.35 (mg/dL)    Calcium 7.4 (*) 8.4 - 10.5 (mg/dL)    Total Protein 6.6  6.0 - 8.3 (g/dL)    Albumin 2.8 (*) 3.5 - 5.2 (g/dL)    AST 85 (*) 0 - 37 (U/L)    ALT 37  0 - 53 (U/L)    Alkaline  Phosphatase 54  39 - 117 (U/L)    Total Bilirubin 0.7  0.3 - 1.2 (mg/dL)    GFR calc non Af Amer 15 (*) >90 (mL/min)    GFR calc Af Amer 17 (*) >90 (mL/min)   PROCALCITONIN     Status: Normal   Collection Time   06/08/11  2:30 AM      Component Value Range Comment   Procalcitonin 4.91     TYPE AND SCREEN     Status: Normal   Collection Time   06/08/11  2:30 AM      Component Value Range Comment   ABO/RH(D) B POS      Antibody Screen NEG      Sample Expiration 06/11/2011     CORTISOL     Status: Normal   Collection Time   06/08/11  2:30 AM      Component Value Range Comment   Cortisol, Plasma 13.5     PROTIME-INR     Status: Abnormal   Collection Time   06/08/11  2:30 AM      Component Value Range Comment   Prothrombin Time 16.2 (*) 11.6 - 15.2 (seconds)    INR 1.27  0.00 - 1.49    APTT     Status: Normal   Collection Time   06/08/11  2:30 AM      Component Value Range Comment   aPTT 29  24 - 37 (seconds)   CARBOXYHEMOGLOBIN     Status: Abnormal   Collection Time   06/08/11  3:00 AM      Component Value Range Comment   Total hemoglobin 12.6 (*) 13.5 - 18.0 (g/dL)    O2 Saturation 16.1      Carboxyhemoglobin 0.7  0.5 - 1.5 (%)    Methemoglobin 0.8  0.0 - 1.5 (%)   GLUCOSE, CAPILLARY     Status: Abnormal   Collection Time   06/08/11  4:01 AM      Component Value Range Comment   Glucose-Capillary 150 (*) 70 - 99 (mg/dL)   CARBOXYHEMOGLOBIN     Status: Abnormal   Collection Time   06/08/11  5:35 AM      Component Value Range  Comment   Total hemoglobin 12.7 (*) 13.5 - 18.0 (g/dL)    O2 Saturation 09.6      Carboxyhemoglobin 1.1  0.5 - 1.5 (%)    Methemoglobin 1.7 (*) 0.0 - 1.5 (%)   POCT I-STAT 3, BLOOD GAS (G3+)     Status: Abnormal   Collection Time   06/08/11  6:39 AM      Component Value Range Comment   pH, Arterial 7.300 (*) 7.350 - 7.450     pCO2 arterial 35.9  35.0 - 45.0 (mmHg)    pO2, Arterial 61.0 (*) 80.0 - 100.0 (mmHg)    Bicarbonate 17.8 (*) 20.0 - 24.0 (mEq/L)    TCO2 19  0 - 100 (mmol/L)    O2 Saturation 89.0      Acid-base deficit 8.0 (*) 0.0 - 2.0 (mmol/L)    Patient temperature 36.5 C      Collection site ARTERIAL LINE      Drawn by Nurse      Sample type ARTERIAL     GLUCOSE, CAPILLARY     Status: Abnormal   Collection Time   06/08/11  7:15 AM      Component Value Range Comment   Glucose-Capillary 147 (*) 70 - 99 (mg/dL)   CARDIAC PANEL(CRET KIN+CKTOT+MB+TROPI)  Status: Abnormal   Collection Time   06/08/11 10:00 AM      Component Value Range Comment   Total CK 246 (*) 7 - 232 (U/L)    CK, MB 5.7 (*) 0.3 - 4.0 (ng/mL)    Troponin I 0.59 (*) <0.30 (ng/mL)    Relative Index 2.3  0.0 - 2.5    POCT I-STAT 3, BLOOD GAS (G3+)     Status: Abnormal   Collection Time   06/08/11 10:32 AM      Component Value Range Comment   pH, Arterial 7.287 (*) 7.350 - 7.450     pCO2 arterial 35.5  35.0 - 45.0 (mmHg)    pO2, Arterial 76.0 (*) 80.0 - 100.0 (mmHg)    Bicarbonate 17.0 (*) 20.0 - 24.0 (mEq/L)    TCO2 18  0 - 100 (mmol/L)    O2 Saturation 94.0      Acid-base deficit 9.0 (*) 0.0 - 2.0 (mmol/L)    Patient temperature 98.0 F      Collection site ARTERIAL LINE      Sample type ARTERIAL     BASIC METABOLIC PANEL     Status: Abnormal   Collection Time   06/08/11 11:00 AM      Component Value Range Comment   Sodium 129 (*) 135 - 145 (mEq/L)    Potassium 5.6 (*) 3.5 - 5.1 (mEq/L)    Chloride 90 (*) 96 - 112 (mEq/L)    CO2 18 (*) 19 - 32 (mEq/L)    Glucose, Bld 172 (*) 70 - 99 (mg/dL)     BUN 161 (*) 6 - 23 (mg/dL) RESULT CONFIRMED BY AUTOMATED DILUTION   Creatinine, Ser 4.50 (*) 0.50 - 1.35 (mg/dL)    Calcium 6.9 (*) 8.4 - 10.5 (mg/dL)    GFR calc non Af Amer 14 (*) >90 (mL/min)    GFR calc Af Amer 16 (*) >90 (mL/min)   GLUCOSE, CAPILLARY     Status: Abnormal   Collection Time   06/08/11 12:19 PM      Component Value Range Comment   Glucose-Capillary 165 (*) 70 - 99 (mg/dL)   CARDIAC PANEL(CRET KIN+CKTOT+MB+TROPI)     Status: Abnormal   Collection Time   06/08/11  3:09 PM      Component Value Range Comment   Total CK 247 (*) 7 - 232 (U/L)    CK, MB 6.0 (*) 0.3 - 4.0 (ng/mL)    Troponin I 0.93 (*) <0.30 (ng/mL)    Relative Index 2.4  0.0 - 2.5    GLUCOSE, CAPILLARY     Status: Abnormal   Collection Time   06/08/11  3:53 PM      Component Value Range Comment   Glucose-Capillary 151 (*) 70 - 99 (mg/dL)     Dg Chest Port 1 View  06/08/2011  *RADIOLOGY REPORT*  Clinical Data: Central line placement and respiratory failure.  PORTABLE CHEST - 1 VIEW  Comparison: 06/07/2011  Findings: Right jugular central catheter has been placed since the prior chest x-ray.  The tip lies in the proximal SVC.  No pneumothorax present.  Endotracheal tube positioning stable with tip located approximately 2.5 cm above the carina.  Left jugular central line and nasogastric tube positioning stable.  Persistent bilateral lower lobe atelectasis, left greater than right.  IMPRESSION: New right-sided jugular central catheter tip lies in the expected position of the proximal SVC.  No pneumothorax after the procedure.  Original Report Authenticated By: Reola Calkins, M.D.  Dg Chest Port 1 View  06/07/2011  *RADIOLOGY REPORT*  Clinical Data: Oxygen desaturation.  Patient on ventilator.  PORTABLE CHEST - 1 VIEW  Comparison: Plain film chest 06/06/1937 6:14 a.m.  Findings: Support tubes and lines are unchanged.  Marked enlargement of the cardiopericardial silhouette is again seen. Bibasilar airspace  disease and likely small effusions persist but appear improved, particularly on the right.  No pneumothorax.  IMPRESSION: Some improvement in basilar airspace disease and small effusions, more notable on the right.  No new abnormality.  Original Report Authenticated By: Bernadene Bell. Maricela Curet, M.D.   Dg Chest Port 1 View  06/07/2011  *RADIOLOGY REPORT*  Clinical Data: Respiratory failure  PORTABLE CHEST - 1 VIEW  Comparison: 06/06/2011  Findings: Endotracheal tube tip approximately 2 cm above the carina.  Central line positioning stable.  Stable cardiomegaly.  No overt pulmonary edema.  Atelectasis involving both lower lobes is relatively stable.  IMPRESSION: Stable bilateral lower lobe atelectasis.  No overt pulmonary edema.  Original Report Authenticated By: Reola Calkins, M.D.    ROS not obtainable. Blood pressure 90/73, pulse 143, temperature 98.4 F (36.9 C), temperature source Core (Comment), resp. rate 24, height 5\' 11"  (1.803 m), weight 146.4 kg (322 lb 12.1 oz), SpO2 90.00%. Presently on levophed, phenyephrine, and vasopressin drips. Sedated on vent. JVP is elevated. No carotid bruits heard. Chest rhonchi anteriorly. Heart rapid irregular rate. No murmur gallop or rub. Abdomen: soft.  No apparent tenderness. Extremities: 4+ marked chronic pitting edema.  Assessment/Plan: 1. Shock of uncertain etiology ?sepsis ? Cardiogenic? 2. Atrial flutter with rapid ventricular response of uncertain duration 3. Cardiomyopathy ? Etiology.  There are regional wall motion abnormalities with severe depression of LV systolic function EF 15-20%.  This could be from chronic ischemic heart disease but no history of angina etc. Tachycardia-induced cardiomyopathy also a possibility. 4. Mildly elevated troponin consistent with enzyme leak secondary to tachycardia and shock rather than a primary cardiac event (MI).  Rec:  This man is critically ill. Would continue to support with pressors.  Could consider  adding milronone drip.  For rapid ventricular response to atrial flutter will give trial of IV digoxin 0.25 mg now and repeat in 6 hours.  Cannot use cardizem because of BP issues.  Cassell Clement 06/08/2011, 5:51 PM

## 2011-06-08 NOTE — Progress Notes (Signed)
[facility] ROUNDING NOTE   Subjective:   Interval History: none. Now with worsening shock  Objective:  Vital signs in last 24 hours:  Temp:  [96.8 F (36 C)-98.4 F (36.9 C)] 98.4 F (36.9 C) (05/12 1230) Pulse Rate:  [34-229] 64  (05/12 1230) Resp:  [12-35] 15  (05/12 1230) BP: (46-122)/(29-83) 122/83 mmHg (05/12 1230) SpO2:  [81 %-99 %] 93 % (05/12 1230) Arterial Line BP: (63-117)/(42-76) 106/72 mmHg (05/12 1230) FiO2 (%):  [50 %-100 %] 90 % (05/12 1141) Weight:  [146.4 kg (322 lb 12.1 oz)] 146.4 kg (322 lb 12.1 oz) (05/12 0456)  Weight change: 1.6 kg (3 lb 8.4 oz) Filed Weights   06/06/11 0500 06/07/11 0500 06/08/11 0456  Weight: 143.5 kg (316 lb 5.8 oz) 144.8 kg (319 lb 3.6 oz) 146.4 kg (322 lb 12.1 oz)    Intake/Output: I/O last 3 completed shifts: In: 4815.5 [I.V.:2009.5; NG/GT:1140; IV Piggyback:1666] Out: 366 [Urine:366]   Intake/Output this shift:  Total I/O In: 726.3 [I.V.:488.8; NG/GT:150; IV Piggyback:87.5] Out: 0   CVS- RRR RS- CTA ABD- BS present soft non-distended EXT- no edema   Basic Metabolic Panel:  Lab 06/08/11 4540 06/08/11 0230 06/07/11 0500 06/06/11 0500 06/05/11 0435  NA 129* 131* 131* 129* 134*  K 5.6* 5.2* 4.4 3.9 4.1  CL 90* 93* 93* 92* 96  CO2 18* 16* 20 22 24   GLUCOSE 172* 137* 126* 124* 126*  BUN 138* 130* 111* 87* 68*  CREATININE 4.50* 4.17* 3.65* 2.78* 2.29*  CALCIUM 6.9* 7.4* 7.6* -- --  MG -- -- -- -- --  PHOS -- -- 5.9* 4.7* 3.9    Liver Function Tests:  Lab 06/08/11 0230 06/07/11 0500 06/06/11 0500 06/05/11 0435 06/04/11 0505  AST 85* 34 32 36 49*  ALT 37 16 16 16 23   ALKPHOS 54 44 36* 31* 47  BILITOT 0.7 0.4 0.4 0.6 1.0  PROT 6.6 6.1 6.1 5.9* 6.9  ALBUMIN 2.8* 2.6* 2.3* 2.2* 2.9*    Lab 06/03/11 2255  LIPASE 55  AMYLASE --   No results found for this basename: AMMONIA:3 in the last 168 hours  CBC:  Lab 06/08/11 0230 06/07/11 2105 06/07/11 0500 06/06/11 0500 06/05/11 0435 06/04/11 0505    WBC 23.9* 15.9* 15.3* 18.0* 11.5* --  NEUTROABS -- 14.6* 14.4* 16.6* 10.5* 11.7*  HGB 12.6* 10.5* 10.9* 11.4* 11.4* --  HCT 37.6* 31.0* 32.3* 33.8* 35.0* --  MCV 87.6 87.3 87.1 86.2 89.3 --  PLT 119* 88* 108* 92* 83* --    Cardiac Enzymes:  Lab 06/08/11 1000 06/07/11 2105 06/04/11 1410 06/03/11 2326  CKTOTAL 246* 138 163 185  CKMB 5.7* 2.9 2.1 1.4  CKMBINDEX -- -- -- --  TROPONINI 0.59* <0.30 <0.30 <0.30    BNP: No components found with this basename: POCBNP:5  CBG:  Lab 06/08/11 0715 06/08/11 0401 06/07/11 2338 06/07/11 1930 06/07/11 1620  GLUCAP 147* 150* 122* 132* 121*    Microbiology: Results for orders placed during the hospital encounter of 06/03/11  URINE CULTURE     Status: Normal   Collection Time   06/03/11 10:53 PM      Component Value Range Status Comment   Specimen Description URINE, RANDOM   Final    Special Requests ADDED [id number]   Final    Culture  Setup Time [id number]   Final    Colony Count >=100,000 COLONIES/ML   Final    Culture     Final    Value: Multiple  bacterial morphotypes present, none predominant. Suggest appropriate recollection if clinically indicated.   Report Status 06/05/2011 FINAL   Final   CULTURE, BLOOD (ROUTINE X 2)     Status: Normal (Preliminary result)   Collection Time   06/03/11 11:45 PM      Component Value Range Status Comment   Specimen Description BLOOD RIGHT ARM   Final    Special Requests BOTTLES DRAWN AEROBIC AND ANAEROBIC 10CC   Final    Culture  Setup Time [id number]   Final    Culture     Final    Value:        BLOOD CULTURE RECEIVED NO GROWTH TO DATE CULTURE WILL BE HELD FOR 5 DAYS BEFORE ISSUING A FINAL NEGATIVE REPORT   Report Status PENDING   Incomplete   CULTURE, BLOOD (ROUTINE X 2)     Status: Normal (Preliminary result)   Collection Time   06/03/11 11:50 PM      Component Value Range Status Comment   Specimen Description BLOOD RIGHT ARM   Final    Special Requests     Final    Value: BOTTLES DRAWN  AEROBIC AND ANAEROBIC 5CC AEROBOC 3CC ANAEROBIC   Culture  Setup Time [id number]   Final    Culture     Final    Value:        BLOOD CULTURE RECEIVED NO GROWTH TO DATE CULTURE WILL BE HELD FOR 5 DAYS BEFORE ISSUING A FINAL NEGATIVE REPORT   Report Status PENDING   Incomplete   MRSA PCR SCREENING     Status: Normal   Collection Time   06/04/11  3:08 AM      Component Value Range Status Comment   MRSA by PCR NEGATIVE  NEGATIVE  Final   RESPIRATORY VIRUS PANEL (18 COMPONENTS)     Status: Abnormal   Collection Time   06/04/11  6:09 AM      Component Value Range Status Comment   Source - RVPAN NASAL MUCOSA   Final    Respiratory Syncytial Virus A NOT DETECTED   Final    Respiratory Syncytial Virus B NOT DETECTED   Final    Influenza A NOT DETECTED   Final    Influenza B NOT DETECTED   Final    Parainfluenza 1 NOT DETECTED   Final    Parainfluenza 2 NOT DETECTED   Final    Parainfluenza 3 DETECTED (*)  Final    Parainfluenza 4 NOT DETECTED   Final    Metapneumovirus NOT DETECTED   Final    [place]ox[place] and Echovirus NOT DETECTED   Final    Rhinovirus NOT DETECTED   Final    Adenovirus B NOT DETECTED   Final    Adenovirus E NOT DETECTED   Final    CoronavirusNL63 NOT DETECTED   Final    CoronavirusHKU1 NOT DETECTED   Final    Coronavirus229E NOT DETECTED   Final    CoronavirusOC43 NOT DETECTED   Final   CULTURE, EXPECTORATED SPUTUM-ASSESSMENT     Status: Normal   Collection Time   06/04/11  7:48 AM      Component Value Range Status Comment   Specimen Description SPUTUM   Final    Special Requests NONE   Final    Sputum evaluation     Final    Value: MICROSCOPIC FINDINGS SUGGEST THAT THIS SPECIMEN IS NOT REPRESENTATIVE OF LOWER RESPIRATORY SECRETIONS. PLEASE RECOLLECT.     CALLED TO D ORTIZ,RN  06/04/11 0911 BY K SCHULTZ   Report Status 06/04/2011 FINAL   Final   AFB CULTURE WITH SMEAR     Status: Normal (Preliminary result)   Collection Time   06/04/11 10:30 AM      Component Value Range  Status Comment   Specimen Description BRONCHIAL WASHINGS   Final    Special Requests NONE   Final    ACID FAST SMEAR NO ACID FAST BACILLI SEEN   Final    Culture     Final    Value: CULTURE WILL BE EXAMINED FOR 6 WEEKS BEFORE ISSUING A FINAL REPORT   Report Status PENDING   Incomplete   PNEUMOCYSTIS JIROVECI SMEAR BY DFA     Status: Normal   Collection Time   06/04/11 10:30 AM      Component Value Range Status Comment   Specimen Source-PJSRC BRONCHIAL WASHINGS   Final    Pneumocystis jiroveci Ag NEGATIVE   Final Performed at [facility]  FUNGUS CULTURE W SMEAR     Status: Normal (Preliminary result)   Collection Time   06/04/11 10:30 AM      Component Value Range Status Comment   Specimen Description BRONCHIAL WASHINGS   Final    Special Requests NONE   Final    Fungal Smear NO YEAST OR FUNGAL ELEMENTS SEEN   Final    Culture CULTURE IN PROGRESS FOR FOUR WEEKS   Final    Report Status PENDING   Incomplete   CULTURE, RESPIRATORY     Status: Normal   Collection Time   06/04/11 10:30 AM      Component Value Range Status Comment   Specimen Description BRONCHIAL WASHINGS   Final    Special Requests NONE   Final    Gram Stain     Final    Value: FEW WBC PRESENT,BOTH PMN AND MONONUCLEAR     NO SQUAMOUS EPITHELIAL CELLS SEEN     NO ORGANISMS SEEN   Culture NO GROWTH 2 DAYS   Final    Report Status 06/07/2011 FINAL   Final   HSV PCR     Status: Normal   Collection Time   06/04/11 10:30 AM      Component Value Range Status Comment   HSV, PCR Not Detected  Not Detected  Final    HSV 2 , PCR Not Detected  Not Detected  Final    Specimen Source-HSVPCR NO GROWTH   Final   CULTURE, BLOOD (ROUTINE X 2)     Status: Normal (Preliminary result)   Collection Time   06/04/11  3:15 PM      Component Value Range Status Comment   Specimen Description BLOOD RIGHT HAND   Final    Special Requests BOTTLES DRAWN AEROBIC AND ANAEROBIC 10CC   Final    Culture  Setup Time [id number]   Final      Culture     Final    Value:        BLOOD CULTURE RECEIVED NO GROWTH TO DATE CULTURE WILL BE HELD FOR 5 DAYS BEFORE ISSUING A FINAL NEGATIVE REPORT   Report Status PENDING   Incomplete   CULTURE, BLOOD (ROUTINE X 2)     Status: Normal (Preliminary result)   Collection Time   06/04/11  3:27 PM      Component Value Range Status Comment   Specimen Description BLOOD LEFT HAND   Final    Special Requests BOTTLES DRAWN AEROBIC ONLY 3CC  Final    Culture  Setup Time [id number]   Final    Culture     Final    Value:        BLOOD CULTURE RECEIVED NO GROWTH TO DATE CULTURE WILL BE HELD FOR 5 DAYS BEFORE ISSUING A FINAL NEGATIVE REPORT   Report Status PENDING   Incomplete     Coagulation Studies:  Basename 06/08/11 0230  LABPROT 16.2*  INR 1.27    Urinalysis: No results found for this basename: COLORURINE:2,APPERANCEUR:2,LABSPEC:2,PHURINE:2,GLUCOSEU:2,HGBUR:2,BILIRUBINUR:2,KETONESUR:2,PROTEINUR:2,UROBILINOGEN:2,NITRITE:2,LEUKOCYTESUR:2 in the last 72 hours    Imaging: Dg Chest Port 1 View  06/07/2011  *RADIOLOGY REPORT*  Clinical Data: Oxygen desaturation.  Patient on ventilator.  PORTABLE CHEST - 1 VIEW  Comparison: Plain film chest 06/06/1937 6:14 a.m.  Findings: Support tubes and lines are unchanged.  Marked enlargement of the cardiopericardial silhouette is again seen. Bibasilar airspace disease and likely small effusions persist but appear improved, particularly on the right.  No pneumothorax.  IMPRESSION: Some improvement in basilar airspace disease and small effusions, more notable on the right.  No new abnormality.  Original Report Authenticated By: Bernadene Bell. Maricela Curet, M.D.   Dg Chest Port 1 View  06/07/2011  *RADIOLOGY REPORT*  Clinical Data: Respiratory failure  PORTABLE CHEST - 1 VIEW  Comparison: 06/06/2011  Findings: Endotracheal tube tip approximately 2 cm above the carina.  Central line positioning stable.  Stable cardiomegaly.  No overt pulmonary edema.  Atelectasis involving  both lower lobes is relatively stable.  IMPRESSION: Stable bilateral lower lobe atelectasis.  No overt pulmonary edema.  Original Report Authenticated By: Reola Calkins, M.D.     Medications:      . sodium chloride 20 mL/hr at 06/05/11 0841  . dextrose    . fentaNYL infusion INTRAVENOUS 75 mcg/hr (06/08/11 0413)  . midazolam (VERSED) infusion 3 mg/hr (06/08/11 0730)  . norepinephrine (LEVOPHED) Adult infusion 5 mcg/min (06/08/11 1150)  . phenylephrine (NEO-SYNEPHRINE) Adult infusion 200 mcg/min (06/08/11 1000)  . vasopressin (PITRESSIN) infusion - *FOR SHOCK* 0.03 Units/min (06/08/11 0242)  . DISCONTD: amiodarone (NEXTERONE PREMIX) 360 mg/200 mL dextrose    . DISCONTD: amiodarone (NEXTERONE PREMIX) 360 mg/200 mL dextrose    . DISCONTD: phenylephrine (NEO-SYNEPHRINE) Adult infusion 50 mcg/min (06/08/11 0100)  . DISCONTD: propofol Stopped (06/07/11 2339)      . amiodarone (NEXTERONE PREMIX) 360 mg/200 mL dextrose      . amiodarone  150 mg Intravenous Once  . antiseptic oral rinse  15 mL Mouth Rinse QID  . calcitRIOL  1 mcg Oral Q M,W,F  . ceFEPime (MAXIPIME) IV  1 g Intravenous Q12H  . chlorhexidine  15 mL Mouth Rinse BID  . collagenase   Topical Daily  . feeding supplement (OXEPA)  1,000 mL Per Tube Q24H  . feeding supplement  30 mL Oral BID WC  . haloperidol lactate  5 mg Intravenous Once  . heparin subcutaneous  5,000 Units Subcutaneous Q8H  . hydrocortisone sod succinate (SOLU-CORTEF) injection  50 mg Intravenous Q6H  . insulin aspart  0-4 Units Subcutaneous Q4H  . micafungin (MYCAMINE) IV  100 mg Intravenous Daily  . mulitivitamin  5 mL Per Tube Daily  . pantoprazole sodium  40 mg Per Tube Q1200  . piperacillin-tazobactam  3.375 g Intravenous Once  . sirolimus  0.75 mg Oral Daily  . sodium bicarbonate  100 mEq Intravenous Once  . sodium chloride  1,000 mL Intravenous Once  . sodium chloride  500 mL Intravenous Once  . DISCONTD: calcitRIOL  0.5 mcg  Oral Q T,Th,S,Su    . DISCONTD: methylPREDNISolone (SOLU-MEDROL) injection  40 mg Intravenous Q24H  . DISCONTD: piperacillin-tazobactam (ZOSYN)  IV  3.375 g Intravenous Q8H  . DISCONTD: vancomycin  1,750 mg Intravenous Q24H   albuterol, fentaNYL, fentaNYL, midazolam, sodium chloride, vasopressin (PITRESSIN) infusion - *FOR SHOCK*, DISCONTD: DOBUTamine, DISCONTD: norepinephrine (LEVOPHED) Adult infusion, DISCONTD: phenylephrine (NEO-SYNEPHRINE) Adult infusion  Assessment/ Plan:  Acute renal failure s/p pneumonia and hypotension. Now in shock, thromboembolic, cardiogenic or septic likely.  HTN /Vol now requiring pressors extremities are chronically swollen  Electrolytes stable potassium levels increased will need bicrabonate Anemia stable I have discussed the care with Dr Pearson Forster will discuss options with family and make a decision requiring starting CVVHD   LOS: 5 Chad [name] W @TODAY @12 :56 PM

## 2011-06-09 ENCOUNTER — Inpatient Hospital Stay (HOSPITAL_COMMUNITY): Payer: BC Managed Care – PPO

## 2011-06-09 DIAGNOSIS — I5021 Acute systolic (congestive) heart failure: Secondary | ICD-10-CM | POA: Diagnosis present

## 2011-06-09 DIAGNOSIS — I5031 Acute diastolic (congestive) heart failure: Secondary | ICD-10-CM

## 2011-06-09 DIAGNOSIS — A419 Sepsis, unspecified organism: Secondary | ICD-10-CM

## 2011-06-09 DIAGNOSIS — R579 Shock, unspecified: Secondary | ICD-10-CM

## 2011-06-09 DIAGNOSIS — N5089 Other specified disorders of the male genital organs: Secondary | ICD-10-CM

## 2011-06-09 DIAGNOSIS — J189 Pneumonia, unspecified organism: Secondary | ICD-10-CM

## 2011-06-09 LAB — CARDIAC PANEL(CRET KIN+CKTOT+MB+TROPI)
CK, MB: 6.7 ng/mL (ref 0.3–4.0)
Total CK: 220 U/L (ref 7–232)

## 2011-06-09 LAB — RENAL FUNCTION PANEL
BUN: 113 mg/dL — ABNORMAL HIGH (ref 6–23)
BUN: 98 mg/dL — ABNORMAL HIGH (ref 6–23)
CO2: 20 mEq/L (ref 19–32)
CO2: 21 mEq/L (ref 19–32)
Calcium: 7.2 mg/dL — ABNORMAL LOW (ref 8.4–10.5)
Chloride: 93 mEq/L — ABNORMAL LOW (ref 96–112)
Creatinine, Ser: 3.78 mg/dL — ABNORMAL HIGH (ref 0.50–1.35)
GFR calc Af Amer: 23 mL/min — ABNORMAL LOW (ref >90)
Glucose, Bld: 146 mg/dL — ABNORMAL HIGH (ref 70–99)
Potassium: 4.6 mEq/L (ref 3.5–5.1)
Sodium: 131 mEq/L — ABNORMAL LOW (ref 135–145)

## 2011-06-09 LAB — POCT ACTIVATED CLOTTING TIME
Activated Clotting Time: 166 seconds
Activated Clotting Time: 138 seconds

## 2011-06-09 LAB — CARBOXYHEMOGLOBIN
O2 Saturation: 90.9 %
Total hemoglobin: 11.5 g/dL — ABNORMAL LOW (ref 13.5–18.0)

## 2011-06-09 LAB — FUNGAL ANTIBODIES PANEL, ID-BLOOD
Aspergillus Flavus Antibodies: NEGATIVE
Aspergillus Niger Antibodies: NEGATIVE

## 2011-06-09 LAB — POCT I-STAT 3, ART BLOOD GAS (G3+)
Patient temperature: 35.3
TCO2: 25 mmol/L (ref 0–100)
pCO2 arterial: 43.1 mmHg (ref 35.0–45.0)
pH, Arterial: 7.341 — ABNORMAL LOW (ref 7.350–7.450)

## 2011-06-09 LAB — CBC
HCT: 33.3 % — ABNORMAL LOW (ref 39.0–52.0)
MCHC: 33.3 g/dL (ref 30.0–36.0)
MCV: 87.2 fL (ref 78.0–100.0)
Platelets: 110 10*3/uL — ABNORMAL LOW (ref 150–400)
RDW: 16.5 % — ABNORMAL HIGH (ref 11.5–15.5)
WBC: 20.5 10*3/uL — ABNORMAL HIGH (ref 4.0–10.5)

## 2011-06-09 LAB — OCCULT BLOOD X 1 CARD TO LAB, STOOL: Fecal Occult Bld: POSITIVE

## 2011-06-09 LAB — GLUCOSE, CAPILLARY
Glucose-Capillary: 156 mg/dL — ABNORMAL HIGH (ref 70–99)
Glucose-Capillary: 129 mg/dL — ABNORMAL HIGH (ref 70–99)
Glucose-Capillary: 138 mg/dL — ABNORMAL HIGH (ref 70–99)
Glucose-Capillary: 140 mg/dL — ABNORMAL HIGH (ref 70–99)

## 2011-06-09 LAB — VANCOMYCIN, RANDOM: Vancomycin Rm: 38.1 ug/mL

## 2011-06-09 MED ORDER — AMIODARONE HCL IN DEXTROSE 360-4.14 MG/200ML-% IV SOLN
0.5000 mg/min | INTRAVENOUS | Status: DC
  Administered 2011-06-10 – 2011-06-24 (×26): 0.5 mg/min via INTRAVENOUS
  Filled 2011-06-09 (×65): qty 200

## 2011-06-09 MED ORDER — MIDAZOLAM HCL 5 MG/ML IJ SOLN: 1.0000 mg | INTRAMUSCULAR | Status: DC | PRN

## 2011-06-09 MED ORDER — HEPARIN BOLUS VIA INFUSION (CRRT)
1000.0000 [IU] | INTRAVENOUS | Status: DC | PRN
  Filled 2011-06-09: qty 1000

## 2011-06-09 MED ORDER — NOREPINEPHRINE BITARTRATE 1 MG/ML IJ SOLN
2.0000 ug/min | INTRAVENOUS | Status: DC
  Administered 2011-06-09: 12 ug/min via INTRAVENOUS
  Administered 2011-06-09 – 2011-06-10 (×2): 25 ug/min via INTRAVENOUS
  Filled 2011-06-09 (×4): qty 16

## 2011-06-09 MED ORDER — AMIODARONE LOAD VIA INFUSION
150.0000 mg | Freq: Once | INTRAVENOUS | Status: AC
  Administered 2011-06-09: 150 mg via INTRAVENOUS
  Filled 2011-06-09: qty 83.34

## 2011-06-09 MED ORDER — MIDAZOLAM HCL 2 MG/2ML IJ SOLN
INTRAMUSCULAR | Status: AC
  Administered 2011-06-09: 4 mg
  Filled 2011-06-09: qty 4

## 2011-06-09 MED ORDER — DIGOXIN 0.25 MG/ML IJ SOLN
0.2500 mg | Freq: Once | INTRAMUSCULAR | Status: AC
  Administered 2011-06-09: 0.25 mg via INTRAVENOUS
  Filled 2011-06-09: qty 1

## 2011-06-09 MED ORDER — AMIODARONE HCL IN DEXTROSE 360-4.14 MG/200ML-% IV SOLN
1.0000 mg/min | INTRAVENOUS | Status: AC
  Administered 2011-06-09 (×2): 1 mg/min via INTRAVENOUS
  Filled 2011-06-09 (×3): qty 200

## 2011-06-09 MED ORDER — HEPARIN SODIUM (PORCINE) 5000 UNIT/ML IJ SOLN
250.0000 [IU]/h | INTRAMUSCULAR | Status: DC
  Administered 2011-06-09: 250 [IU]/h via INTRAVENOUS_CENTRAL
  Administered 2011-06-10: 1150 [IU]/h via INTRAVENOUS_CENTRAL
  Administered 2011-06-11: 1350 [IU]/h via INTRAVENOUS_CENTRAL
  Filled 2011-06-09 (×6): qty 2

## 2011-06-09 NOTE — Progress Notes (Signed)
eLink Physician-Brief Progress Note Patient Name: JLYNN LANGILLE DOB: 01-12-1956 MRN: [id number]  Date of Service  06/09/2011   HPI/Events of Note  RN asking for versed gtt due to intermittent agitation despite fent gtt  eICU Interventions  Given versed prn   Intervention Category Minor Interventions: Agitation / anxiety - evaluation and management  Destyn Parfitt 06/09/2011, 7:18 PM

## 2011-06-09 NOTE — Progress Notes (Signed)
Subjective:  Remains critically ill on pressors.  CRRT started last night.  No anticoagulation but filter is clotting.  No volume removal but fio2 is 90% Objective Vital signs in last 24 hours: Filed Vitals:   06/09/11 0530 06/09/11 0600 06/09/11 0630 06/09/11 0700  BP:  91/55    Pulse: 128 128 129 129  Temp: 95.5 F (35.3 C) 95.9 F (35.5 C) 96.1 F (35.6 C) 96.1 F (35.6 C)  TempSrc:      Resp: 14 11 12 16   Height:      Weight:      SpO2: 96% 94% 93% 94%   Weight change: 1.1 kg (2 lb 6.8 oz)  Intake/Output Summary (Last 24 hours) at 06/09/11 0748 Last data filed at 06/09/11 0700  Gross per 24 hour  Intake 3258.55 ml  Output   2192 ml  Net 1066.55 ml   Labs: Basic Metabolic Panel:  Lab 06/09/11 4098 06/08/11 1100 06/08/11 0230 06/07/11 0500 06/06/11 0500  NA 130* 129* 131* -- --  K 4.7 5.6* 5.2* -- --  CL 93* 90* 93* -- --  CO2 20 18* 16* -- --  GLUCOSE 160* 172* 137* -- --  BUN 113* 138* 130* -- --  CREATININE 3.78* 4.50* 4.17* -- --  CALCIUM 7.2* 6.9* 7.4* -- --  ALB -- -- -- -- --  PHOS 6.4* -- -- 5.9* 4.7*   Liver Function Tests:  Lab 06/09/11 0441 06/08/11 0230 06/07/11 0500 06/06/11 0500  AST -- 85* 34 32  ALT -- 37 16 16  ALKPHOS -- 54 44 36*  BILITOT -- 0.7 0.4 0.4  PROT -- 6.6 6.1 6.1  ALBUMIN 2.5* 2.8* 2.6* --    Lab 06/03/11 2255  LIPASE 55  AMYLASE --   No results found for this basename: AMMONIA:3 in the last 168 hours CBC:  Lab 06/09/11 0441 06/08/11 0230 06/07/11 2105 06/07/11 0500 06/06/11 0500  WBC 20.5* 23.9* 15.9* -- --  NEUTROABS -- -- 14.6* 14.4* 16.6*  HGB 11.1* 12.6* 10.5* -- --  HCT 33.3* 37.6* 31.0* -- --  MCV 87.2 87.6 87.3 87.1 86.2  PLT 110* 119* 88* -- --   Cardiac Enzymes:  Lab 06/08/11 2345 06/08/11 1509 06/08/11 1000 06/07/11 2105 06/04/11 1410  CKTOTAL 220 247* 246* 138 163  CKMB 6.7* 6.0* 5.7* 2.9 2.1  CKMBINDEX -- -- -- -- --  TROPONINI 0.81* 0.93* 0.59* <0.30 <0.30   CBG:  Lab 06/09/11 0349 06/08/11  2339 06/08/11 1930 06/08/11 1553 06/08/11 1219  GLUCAP 156* 167* 146* 151* 165*    Iron Studies: No results found for this basename: IRON,TIBC,TRANSFERRIN,FERRITIN in the last 72 hours Studies/Results: Dg Chest Port 1 View  06/09/2011  *RADIOLOGY REPORT*  Clinical Data: Pneumonia.  PORTABLE CHEST - 1 VIEW  Comparison: 06/08/2011  Findings: Cardiomegaly. Bilateral airspace disease and effusions, slightly worsened since prior study.  Support devices are in stable position.  IMPRESSION: Mild worsening bilateral airspace disease and effusions, edema versus infection.  Original Report Authenticated By: Cyndie Chime, M.D.   Dg Chest Port 1 View  06/08/2011  *RADIOLOGY REPORT*  Clinical Data: Central line placement and respiratory failure.  PORTABLE CHEST - 1 VIEW  Comparison: 06/07/2011  Findings: Right jugular central catheter has been placed since the prior chest x-ray.  The tip lies in the proximal SVC.  No pneumothorax present.  Endotracheal tube positioning stable with tip located approximately 2.5 cm above the carina.  Left jugular central line and nasogastric tube positioning stable.  Persistent bilateral lower lobe atelectasis, left greater than right.  IMPRESSION: New right-sided jugular central catheter tip lies in the expected position of the proximal SVC.  No pneumothorax after the procedure.  Original Report Authenticated By: Reola Calkins, M.D.   Dg Chest Port 1 View  06/07/2011  *RADIOLOGY REPORT*  Clinical Data: Oxygen desaturation.  Patient on ventilator.  PORTABLE CHEST - 1 VIEW  Comparison: Plain film chest 06/06/1937 6:14 a.m.  Findings: Support tubes and lines are unchanged.  Marked enlargement of the cardiopericardial silhouette is again seen. Bibasilar airspace disease and likely small effusions persist but appear improved, particularly on the right.  No pneumothorax.  IMPRESSION: Some improvement in basilar airspace disease and small effusions, more notable on the right.  No new  abnormality.  Original Report Authenticated By: Bernadene Bell. Maricela Curet, M.D.   Dg Abd Portable 1v  06/09/2011  *RADIOLOGY REPORT*  Clinical Data: Large residuals.  Evaluate for ileus or obstruction.  PORTABLE ABDOMEN - 1 VIEW  Comparison: 04/07/2009  Findings: There is a nonobstructive bowel gas pattern.  No evidence for ileus.  Decreasing gaseous distention since prior study.  No supine evidence of free air.  No acute bony abnormality.  IMPRESSION: No evidence of obstruction or ileus.  Original Report Authenticated By: Cyndie Chime, M.D.   Medications: Infusions:    . sodium chloride 20 mL/hr at 06/09/11 0609  . dextrose    . fentaNYL infusion INTRAVENOUS 75 mcg/hr (06/08/11 0413)  . midazolam (VERSED) infusion 3 mg/hr (06/09/11 0445)  . milrinone 0.25 mcg/kg/min (06/09/11 0444)  . norepinephrine (LEVOPHED) Adult infusion 15 mcg/min (06/09/11 0730)  . phenylephrine (NEO-SYNEPHRINE) Adult infusion Stopped (06/08/11 1957)  . dialysis replacement fluid (prismasate) 300 mL/hr at 06/08/11 1703  . dialysis replacement fluid (prismasate) 200 mL/hr at 06/08/11 1704  . dialysate (PRISMASATE) 2,000 mL/hr at 06/09/11 0617  . vasopressin (PITRESSIN) infusion - *FOR SHOCK* 0.03 Units/min (06/09/11 0730)  . DISCONTD: norepinephrine (LEVOPHED) Adult infusion 10 mcg/min (06/09/11 0500)    Scheduled Medications:    . antiseptic oral rinse  15 mL Mouth Rinse QID  . calcitRIOL  1 mcg Oral Q M,W,F  . ceFEPime (MAXIPIME) IV  2 g Intravenous Q12H  . chlorhexidine  15 mL Mouth Rinse BID  . collagenase   Topical Daily  . digoxin  0.25 mg Intravenous Once  . digoxin  0.25 mg Intravenous Once  . feeding supplement (OXEPA)  1,000 mL Per Tube Q24H  . feeding supplement  30 mL Oral BID WC  . heparin subcutaneous  5,000 Units Subcutaneous Q8H  . hydrocortisone sod succinate (SOLU-CORTEF) injection  50 mg Intravenous Q6H  . insulin aspart  0-4 Units Subcutaneous Q4H  . micafungin (MYCAMINE) IV  100 mg  Intravenous Daily  . mulitivitamin  5 mL Per Tube Daily  . pantoprazole sodium  40 mg Per Tube Q1200  . sirolimus  0.75 mg Oral Daily  . sodium bicarbonate  100 mEq Intravenous Once  . sodium chloride  500 mL Intravenous Once  . DISCONTD: ceFEPime (MAXIPIME) IV  1 g Intravenous Q12H  . DISCONTD: piperacillin-tazobactam (ZOSYN)  IV  3.375 g Intravenous Q8H    have reviewed scheduled and prn medications.  Physical Exam: General: critically ill on vent.  Warmer blanket, sedated Heart: tachy Lungs: CBS bilaterally Abdomen: slightly distended Extremities: chronic lymphedema but also pitting dependant edema Dialysis Access: R IJ cath placed 5/12   I Assessment/ Plan: Pt is a 56 y.o. yo male s/p renal transplant who  was admitted on 06/03/2011 with PNA  pulm HTN, volume overload now with acute on chronic renal failure requiring institution of CRRT Assessment/Plan: 1. PNA/cardiomyopathy/hypotension- per CCM and cards.  Requiring significant vent and hemodynamic support 2. S/p renal transplant with CAN- now ARF-  Support with CRRT.  Labs are improved, continue same dialysate and replacement fluids.  No anticoagulation, looks like may need, will start low dose heparin.  Regarding renal transplant, will continue his home dose of sirolimus and stress dose steriods.  Baseline function was not great with creatinine of 2.  Minimal UOP, will watch 3. Anemia- will support with aranesp if hgb gets below 11 4. Secondary hyperparathyroidism- no treatment right now 5. HTN/volume very overloaded appearing.  Even though CVP is 10 and on pressors and tachycardic.  Will try minimal volume removal.  6.  Critically ill- prognosis seems poor.   Mayetta Castleman A   06/09/2011,7:48 AM  LOS: 6 days

## 2011-06-09 NOTE — Procedures (Signed)
Name:  Chad [name] MRN:  [id number] DOB:  Jun 07, 1955  PROCEDURE NOTE  Procedure:  Ultrasound-guided HD catheter placement.  Indications:  Need for intravenous access and hemodynamic monitoring.  Consent:  Consent was implied due to the emergency nature of the procedure.  Anesthesia:  A total of 10 mL of 1% Lidocaine was used for local infiltration anesthesia.  Procedure summary:  Appropriate equipment was assembled.  The patient was identified as Chad [name] and safety timeout was performed. The patient was placed in Trendelenburg position.  Sterile technique was used. The patient's right neck region was prepped using chlorhexidine / alcohol scrub and the field was draped in usual sterile fashion with full body drape. The right internal jugular vein and the right carotid artery were identified by ultrasound, the patency was evaluated and images were documented. After the adequate anesthesia was achieved, the vein was cannulated with the introducer needle under sonographic guidance without difficulty. A guide wire was advanced through the introducer needle, which was then withdrawn. A small skin incision was made at the point of wire entry, the dilator was inserted over the guide wire and appropriate dilation was obtained. The dilator was removed and  triple-lumen HD catheter was advanced over the guide wire, which was then removed.  All ports were aspirated and flushed with normal saline without difficulty. The catheter was secured into place with sutures at 18 cm. Antibiotic patch was placed and sterile dressing was applied. Post-procedure chest x-ray was ordered.  Complications:  No immediate complications were noted.  Hemodynamic parameters and oxygenation remained stable throughout the procedure.  Estimated blood loss:  Less then 5 mL.  Orlean Bradford, M.D. Pulmonary and Critical Care Medicine [facility] Cell: [phone] Pager: [phone]  06/09/2011, 10:58  PM

## 2011-06-09 NOTE — Progress Notes (Signed)
INFECTIOUS DISEASE PROGRESS NOTE  ID: Chad [name] is a 56 y.o. male with  A history of renal transplant on immunosuppressive drugs who presented with respiratory failure/sepsis, and intubated.  Has been on broad spectrum antibiotics.    Subjective: Remains intubated, on CVVH  Abtx:  Anti-infectives     Start     Dose/Rate Route Frequency Ordered Stop   06/08/11 2200   ceFEPIme (MAXIPIME) 2 g in dextrose 5 % 50 mL IVPB     Comments: Use Cefepime 1 g IV q12h for CrCl< 60 mL/min      2 g 100 mL/hr over 30 Minutes Intravenous Every 12 hours 06/08/11 1536 06/12/11 0959   06/08/11 0800   piperacillin-tazobactam (ZOSYN) IVPB 3.375 g  Status:  Discontinued        3.375 g 12.5 mL/hr over 240 Minutes Intravenous Every 8 hours 06/08/11 0149 06/08/11 1155   06/08/11 0600   micafungin (MYCAMINE) 100 mg in sodium chloride 0.9 % 100 mL IVPB        100 mg 100 mL/hr over 1 Hours Intravenous Daily 06/08/11 0308     06/08/11 0200  piperacillin-tazobactam (ZOSYN) IVPB 3.375 g       3.375 g 100 mL/hr over 30 Minutes Intravenous  Once 06/08/11 0149 06/08/11 0254   06/06/11 0600   vancomycin (VANCOCIN) 1,750 mg in sodium chloride 0.9 % 500 mL IVPB  Status:  Discontinued        1,750 mg 250 mL/hr over 120 Minutes Intravenous Every 24 hours 06/05/11 0906 06/08/11 0403   06/05/11 2200   levofloxacin (LEVAQUIN) IVPB 750 mg  Status:  Discontinued     Comments: Use Levaquin 750 mg IV q48h for CrCl < 62mL/min      750 mg 100 mL/hr over 90 Minutes Intravenous Every 48 hours 06/04/11 0344 06/05/11 0908   06/05/11 1000   levofloxacin (LEVAQUIN) IVPB 750 mg  Status:  Discontinued     Comments: Use Levaquin 750 mg IV q48h for CrCl < 72mL/min      750 mg 100 mL/hr over 90 Minutes Intravenous Every 24 hours 06/05/11 0908 06/06/11 0908   06/04/11 1200   sulfamethoxazole-trimethoprim (BACTRIM) 664 mg in dextrose 5 % 500 mL IVPB  Status:  Discontinued        20 mg/kg/day  132.7 kg 361 mL/hr over 90 Minutes  Intravenous 4 times per day 06/04/11 1039 06/06/11 0908   06/04/11 1100   micafungin (MYCAMINE) 100 mg in sodium chloride 0.9 % 100 mL IVPB  Status:  Discontinued        100 mg 100 mL/hr over 1 Hours Intravenous Daily 06/04/11 1030 06/06/11 0908   06/04/11 0600   ceFEPIme (MAXIPIME) 1 g in dextrose 5 % 50 mL IVPB  Status:  Discontinued     Comments: Use Cefepime 1 g IV q12h for CrCl< 60 mL/min      1 g 100 mL/hr over 30 Minutes Intravenous Every 12 hours 06/04/11 0344 06/08/11 1536   06/04/11 0600   vancomycin (VANCOCIN) 1,500 mg in sodium chloride 0.9 % 500 mL IVPB  Status:  Discontinued        1,500 mg 250 mL/hr over 120 Minutes Intravenous Every 24 hours 06/04/11 0425 06/05/11 0906   06/04/11 0430   levofloxacin (LEVAQUIN) IVPB 750 mg        750 mg 100 mL/hr over 90 Minutes Intravenous  Once 06/04/11 0354 06/04/11 0632   06/03/11 2330   vancomycin (VANCOCIN) IVPB 1000 mg/200 mL premix  1,000 mg 200 mL/hr over 60 Minutes Intravenous  Once 06/03/11 2326 06/04/11 0051   06/03/11 2330  piperacillin-tazobactam (ZOSYN) IVPB 3.375 g       3.375 g 12.5 mL/hr over 240 Minutes Intravenous  Once 06/03/11 2326 06/04/11 0521          Medications: I have reviewed the patient's current medications.  Objective: Vital signs in last 24 hours: Temp:  [95.4 F (35.2 C)-98.4 F (36.9 C)] 97.7 F (36.5 C) (05/13 0915) Pulse Rate:  [47-144] 144  (05/13 0915) Resp:  [11-24] 23  (05/13 0915) BP: (78-122)/(49-91) 112/67 mmHg (05/13 0900) SpO2:  [72 %-99 %] 95 % (05/13 0915) Arterial Line BP: (63-130)/(45-88) 96/57 mmHg (05/13 0915) FiO2 (%):  [90 %-100 %] 90 % (05/13 0915) Weight:  [325 lb 2.9 oz (147.5 kg)] 325 lb 2.9 oz (147.5 kg) (05/13 0230)   General appearance: intubated, does not respond (on sedation) Resp: diffuse rhonchi, but no wheezes or crackles noted on anterior exam GI: soft, non-tender; bowel sounds normal; no masses,  no organomegaly and obese Extremities: + non-  pitting edema no rashes  Lab Results  Basename 06/09/11 0441 06/08/11 1100 06/08/11 0230  WBC 20.5* -- 23.9*  HGB 11.1* -- 12.6*  HCT 33.3* -- 37.6*  NA 130* 129* --  K 4.7 5.6* --  CL 93* 90* --  CO2 20 18* --  BUN 113* 138* --  CREATININE 3.78* 4.50* --  GLU -- -- --   Liver Panel  Basename 06/09/11 0441 06/08/11 0230 06/07/11 0500  PROT -- 6.6 6.1  ALBUMIN 2.5* 2.8* --  AST -- 85* 34  ALT -- 37 16  ALKPHOS -- 54 44  BILITOT -- 0.7 0.4  BILIDIR -- -- --  IBILI -- -- --   Sedimentation Rate No results found for this basename: ESRSEDRATE in the last 72 hours C-Reactive Protein No results found for this basename: CRP:2 in the last 72 hours  Microbiology: Recent Results (from the past 240 hour(s))  URINE CULTURE     Status: Normal   Collection Time   06/03/11 10:53 PM      Component Value Range Status Comment   Specimen Description URINE, RANDOM   Final    Special Requests ADDED [id number]   Final    Culture  Setup Time [id number]   Final    Colony Count >=100,000 COLONIES/ML   Final    Culture     Final    Value: Multiple bacterial morphotypes present, none predominant. Suggest appropriate recollection if clinically indicated.   Report Status 06/05/2011 FINAL   Final   CULTURE, BLOOD (ROUTINE X 2)     Status: Normal (Preliminary result)   Collection Time   06/03/11 11:45 PM      Component Value Range Status Comment   Specimen Description BLOOD RIGHT ARM   Final    Special Requests BOTTLES DRAWN AEROBIC AND ANAEROBIC 10CC   Final    Culture  Setup Time [id number]   Final    Culture     Final    Value:        BLOOD CULTURE RECEIVED NO GROWTH TO DATE CULTURE WILL BE HELD FOR 5 DAYS BEFORE ISSUING A FINAL NEGATIVE REPORT   Report Status PENDING   Incomplete   CULTURE, BLOOD (ROUTINE X 2)     Status: Normal (Preliminary result)   Collection Time   06/03/11 11:50 PM      Component Value Range Status Comment   Specimen  Description BLOOD RIGHT ARM   Final     Special Requests     Final    Value: BOTTLES DRAWN AEROBIC AND ANAEROBIC 5CC AEROBOC 3CC ANAEROBIC   Culture  Setup Time [id number]   Final    Culture     Final    Value:        BLOOD CULTURE RECEIVED NO GROWTH TO DATE CULTURE WILL BE HELD FOR 5 DAYS BEFORE ISSUING A FINAL NEGATIVE REPORT   Report Status PENDING   Incomplete   MRSA PCR SCREENING     Status: Normal   Collection Time   06/04/11  3:08 AM      Component Value Range Status Comment   MRSA by PCR NEGATIVE  NEGATIVE  Final   RESPIRATORY VIRUS PANEL (18 COMPONENTS)     Status: Abnormal   Collection Time   06/04/11  6:09 AM      Component Value Range Status Comment   Source - RVPAN NASAL MUCOSA   Final    Respiratory Syncytial Virus A NOT DETECTED   Final    Respiratory Syncytial Virus B NOT DETECTED   Final    Influenza A NOT DETECTED   Final    Influenza B NOT DETECTED   Final    Parainfluenza 1 NOT DETECTED   Final    Parainfluenza 2 NOT DETECTED   Final    Parainfluenza 3 DETECTED (*)  Final    Parainfluenza 4 NOT DETECTED   Final    Metapneumovirus NOT DETECTED   Final    Coxsackie and Echovirus NOT DETECTED   Final    Rhinovirus NOT DETECTED   Final    Adenovirus B NOT DETECTED   Final    Adenovirus E NOT DETECTED   Final    CoronavirusNL63 NOT DETECTED   Final    CoronavirusHKU1 NOT DETECTED   Final    Coronavirus229E NOT DETECTED   Final    CoronavirusOC43 NOT DETECTED   Final   CULTURE, EXPECTORATED SPUTUM-ASSESSMENT     Status: Normal   Collection Time   06/04/11  7:48 AM      Component Value Range Status Comment   Specimen Description SPUTUM   Final    Special Requests NONE   Final    Sputum evaluation     Final    Value: MICROSCOPIC FINDINGS SUGGEST THAT THIS SPECIMEN IS NOT REPRESENTATIVE OF LOWER RESPIRATORY SECRETIONS. PLEASE RECOLLECT.     CALLED TO D ORTIZ,RN 06/04/11 0911 BY K SCHULTZ   Report Status 06/04/2011 FINAL   Final   AFB CULTURE WITH SMEAR     Status: Normal (Preliminary result)   Collection  Time   06/04/11 10:30 AM      Component Value Range Status Comment   Specimen Description BRONCHIAL WASHINGS   Final    Special Requests NONE   Final    ACID FAST SMEAR NO ACID FAST BACILLI SEEN   Final    Culture     Final    Value: CULTURE WILL BE EXAMINED FOR 6 WEEKS BEFORE ISSUING A FINAL REPORT   Report Status PENDING   Incomplete   PNEUMOCYSTIS JIROVECI SMEAR BY DFA     Status: Normal   Collection Time   06/04/11 10:30 AM      Component Value Range Status Comment   Specimen Source-PJSRC BRONCHIAL WASHINGS   Final    Pneumocystis jiroveci Ag NEGATIVE   Final Performed at [facility]  FUNGUS  CULTURE W SMEAR     Status: Normal (Preliminary result)   Collection Time   06/04/11 10:30 AM      Component Value Range Status Comment   Specimen Description BRONCHIAL WASHINGS   Final    Special Requests NONE   Final    Fungal Smear NO YEAST OR FUNGAL ELEMENTS SEEN   Final    Culture CULTURE IN PROGRESS FOR FOUR WEEKS   Final    Report Status PENDING   Incomplete   CULTURE, RESPIRATORY     Status: Normal   Collection Time   06/04/11 10:30 AM      Component Value Range Status Comment   Specimen Description BRONCHIAL WASHINGS   Final    Special Requests NONE   Final    Gram Stain     Final    Value: FEW WBC PRESENT,BOTH PMN AND MONONUCLEAR     NO SQUAMOUS EPITHELIAL CELLS SEEN     NO ORGANISMS SEEN   Culture NO GROWTH 2 DAYS   Final    Report Status 06/07/2011 FINAL   Final   HSV PCR     Status: Normal   Collection Time   06/04/11 10:30 AM      Component Value Range Status Comment   HSV, PCR Not Detected  Not Detected  Final    HSV 2 , PCR Not Detected  Not Detected  Final    Specimen Source-HSVPCR NO GROWTH   Final   CULTURE, BLOOD (ROUTINE X 2)     Status: Normal (Preliminary result)   Collection Time   06/04/11  3:15 PM      Component Value Range Status Comment   Specimen Description BLOOD RIGHT HAND   Final    Special Requests BOTTLES DRAWN AEROBIC AND ANAEROBIC 10CC    Final    Culture  Setup Time [id number]   Final    Culture     Final    Value:        BLOOD CULTURE RECEIVED NO GROWTH TO DATE CULTURE WILL BE HELD FOR 5 DAYS BEFORE ISSUING A FINAL NEGATIVE REPORT   Report Status PENDING   Incomplete   CULTURE, BLOOD (ROUTINE X 2)     Status: Normal (Preliminary result)   Collection Time   06/04/11  3:27 PM      Component Value Range Status Comment   Specimen Description BLOOD LEFT HAND   Final    Special Requests BOTTLES DRAWN AEROBIC ONLY 3CC   Final    Culture  Setup Time [id number]   Final    Culture     Final    Value:        BLOOD CULTURE RECEIVED NO GROWTH TO DATE CULTURE WILL BE HELD FOR 5 DAYS BEFORE ISSUING A FINAL NEGATIVE REPORT   Report Status PENDING   Incomplete   CULTURE, BLOOD (ROUTINE X 2)     Status: Normal (Preliminary result)   Collection Time   06/08/11  4:15 AM      Component Value Range Status Comment   Specimen Description BLOOD LEFT HAND   Final    Special Requests BOTTLES DRAWN AEROBIC ONLY 10CC   Final    Culture  Setup Time [id number]   Final    Culture     Final    Value:        BLOOD CULTURE RECEIVED NO GROWTH TO DATE CULTURE WILL BE HELD FOR 5 DAYS BEFORE ISSUING A FINAL NEGATIVE REPORT   Report Status  PENDING   Incomplete   CULTURE, BLOOD (ROUTINE X 2)     Status: Normal (Preliminary result)   Collection Time   06/08/11  4:20 AM      Component Value Range Status Comment   Specimen Description BLOOD LEFT HAND   Final    Special Requests BOTTLES DRAWN AEROBIC ONLY 10CC   Final    Culture  Setup Time [id number]   Final    Culture     Final    Value:        BLOOD CULTURE RECEIVED NO GROWTH TO DATE CULTURE WILL BE HELD FOR 5 DAYS BEFORE ISSUING A FINAL NEGATIVE REPORT   Report Status PENDING   Incomplete     Studies/Results: Dg Chest Port 1 View  06/09/2011  *RADIOLOGY REPORT*  Clinical Data: Pneumonia.  PORTABLE CHEST - 1 VIEW  Comparison: 06/08/2011  Findings: Cardiomegaly. Bilateral airspace disease and  effusions, slightly worsened since prior study.  Support devices are in stable position.  IMPRESSION: Mild worsening bilateral airspace disease and effusions, edema versus infection.  Original Report Authenticated By: Cyndie Chime, M.D.   Dg Chest Port 1 View  06/08/2011  *RADIOLOGY REPORT*  Clinical Data: Central line placement and respiratory failure.  PORTABLE CHEST - 1 VIEW  Comparison: 06/07/2011  Findings: Right jugular central catheter has been placed since the prior chest x-ray.  The tip lies in the proximal SVC.  No pneumothorax present.  Endotracheal tube positioning stable with tip located approximately 2.5 cm above the carina.  Left jugular central line and nasogastric tube positioning stable.  Persistent bilateral lower lobe atelectasis, left greater than right.  IMPRESSION: New right-sided jugular central catheter tip lies in the expected position of the proximal SVC.  No pneumothorax after the procedure.  Original Report Authenticated By: Reola Calkins, M.D.   Dg Chest Port 1 View  06/07/2011  *RADIOLOGY REPORT*  Clinical Data: Oxygen desaturation.  Patient on ventilator.  PORTABLE CHEST - 1 VIEW  Comparison: Plain film chest 06/06/1937 6:14 a.m.  Findings: Support tubes and lines are unchanged.  Marked enlargement of the cardiopericardial silhouette is again seen. Bibasilar airspace disease and likely small effusions persist but appear improved, particularly on the right.  No pneumothorax.  IMPRESSION: Some improvement in basilar airspace disease and small effusions, more notable on the right.  No new abnormality.  Original Report Authenticated By: Bernadene Bell. Maricela Curet, M.D.   Dg Abd Portable 1v  06/09/2011  *RADIOLOGY REPORT*  Clinical Data: Large residuals.  Evaluate for ileus or obstruction.  PORTABLE ABDOMEN - 1 VIEW  Comparison: 04/07/2009  Findings: There is a nonobstructive bowel gas pattern.  No evidence for ileus.  Decreasing gaseous distention since prior study.  No supine  evidence of free air.  No acute bony abnormality.  IMPRESSION: No evidence of obstruction or ileus.  Original Report Authenticated By: Cyndie Chime, M.D.     Assessment/Plan: Pneumonia with sepsis - Pneumocystis negative, no other new results.  Fungal antibody panel pending.  WBC up but also on steroids.  Afebrile.  Continue with current broad antibiotics for presumed bacterial origin.  Will d/c Mycafungin if Ab panel is negative.    Charlsie Fleeger Infectious Diseases 06/09/2011, 10:33 AM

## 2011-06-09 NOTE — Progress Notes (Signed)
CRITICAL VALUE ALERT  Critical value received:  CKMB 6.7, Trop .81  Date of notification:  06/09/11  Time of notification:  0055  Critical value read back:yes  Nurse who received alert:  Crist Fat RN   MD notified (1st page):  Dr. Darrick Penna (elink) Time of first page:  0057  MD notified (2nd page):Dr. Deterding  Time of second page:NA  Responding MD:  Dr. Darrick Penna  Time MD responded:  [phone]

## 2011-06-09 NOTE — Consult Note (Addendum)
Reason for Consult:Cardiomyopathy Referring Physician: CCM  IKECHUKWU CERNY is an 56 y.o. male.   [name] is a 56 year old man with past history of remote renal transplant and immunosuppression was admitted with worsening cough fever and dyspnea. Has been tachycardic since admission and EKG shows atrial flutter with rapid ventricular response. Now intubated, on pressors for shock of undermined etiology.  Echo shows EF 15-20% (new). Continues with AFL in 130-140s. Digoxin given yesterday. Trop up to 0.9  Remains on triple pressors with marginal BP. On CVVHD. Was hypothermic and now on [employer].   Remains on vent with 90% FiO2 and PEEP 15. Responds to pain.   Co-ox 50-> 69   Past Medical History  Diagnosis Date  . Hypertension   . H/O kidney transplant     Past Surgical History  Procedure Date  . Nephrectomy transplanted organ     History reviewed. No pertinent family history.  Social History:  reports that he has never smoked. He does not have any smokeless tobacco history on file. He reports that he does not drink alcohol. His drug history not on file.  Allergies: No Known Allergies  Medications:  Scheduled:    . antiseptic oral rinse  15 mL Mouth Rinse QID  . calcitRIOL  1 mcg Oral Q M,W,F  . ceFEPime (MAXIPIME) IV  2 g Intravenous Q12H  . chlorhexidine  15 mL Mouth Rinse BID  . collagenase   Topical Daily  . digoxin  0.25 mg Intravenous Once  . digoxin  0.25 mg Intravenous Once  . feeding supplement (OXEPA)  1,000 mL Per Tube Q24H  . feeding supplement  30 mL Oral BID WC  . heparin subcutaneous  5,000 Units Subcutaneous Q8H  . hydrocortisone sod succinate (SOLU-CORTEF) injection  50 mg Intravenous Q6H  . insulin aspart  0-4 Units Subcutaneous Q4H  . micafungin (MYCAMINE) IV  100 mg Intravenous Daily  . mulitivitamin  5 mL Per Tube Daily  . pantoprazole sodium  40 mg Per Tube Q1200  . sirolimus  0.75 mg Oral Daily  . sodium bicarbonate  100 mEq Intravenous  Once  . sodium chloride  500 mL Intravenous Once  . DISCONTD: ceFEPime (MAXIPIME) IV  1 g Intravenous Q12H  . DISCONTD: piperacillin-tazobactam (ZOSYN)  IV  3.375 g Intravenous Q8H    Results for orders placed during the hospital encounter of 06/03/11 (from the past 48 hour(s))  GLUCOSE, CAPILLARY     Status: Abnormal   Collection Time   06/07/11 11:28 AM      Component Value Range Comment   Glucose-Capillary 128 (*) 70 - 99 (mg/dL)   GLUCOSE, CAPILLARY     Status: Abnormal   Collection Time   06/07/11  4:20 PM      Component Value Range Comment   Glucose-Capillary 121 (*) 70 - 99 (mg/dL)   GLUCOSE, CAPILLARY     Status: Abnormal   Collection Time   06/07/11  7:30 PM      Component Value Range Comment   Glucose-Capillary 132 (*) 70 - 99 (mg/dL)   CARDIAC PANEL(CRET KIN+CKTOT+MB+TROPI)     Status: Normal   Collection Time   06/07/11  9:05 PM      Component Value Range Comment   Total CK 138  7 - 232 (U/L)    CK, MB 2.9  0.3 - 4.0 (ng/mL)    Troponin I <0.30  <0.30 (ng/mL)    Relative Index 2.1  0.0 - 2.5  LACTIC ACID, PLASMA     Status: Abnormal   Collection Time   06/07/11  9:05 PM      Component Value Range Comment   Lactic Acid, Venous 2.3 (*) 0.5 - 2.2 (mmol/L)   CBC     Status: Abnormal   Collection Time   06/07/11  9:05 PM      Component Value Range Comment   WBC 15.9 (*) 4.0 - 10.5 (K/uL)    RBC 3.55 (*) 4.22 - 5.81 (MIL/uL)    Hemoglobin 10.5 (*) 13.0 - 17.0 (g/dL)    HCT 32.4 (*) 40.1 - 52.0 (%)    MCV 87.3  78.0 - 100.0 (fL)    MCH 29.6  26.0 - 34.0 (pg)    MCHC 33.9  30.0 - 36.0 (g/dL)    RDW 02.7 (*) 25.3 - 15.5 (%)    Platelets 88 (*) 150 - 400 (K/uL) CONSISTENT WITH PREVIOUS RESULT  DIFFERENTIAL     Status: Abnormal   Collection Time   06/07/11  9:05 PM      Component Value Range Comment   Neutrophils Relative 92 (*) 43 - 77 (%)    Neutro Abs 14.6 (*) 1.7 - 7.7 (K/uL)    Lymphocytes Relative 6 (*) 12 - 46 (%)    Lymphs Abs 1.0  0.7 - 4.0 (K/uL)     Monocytes Relative 2 (*) 3 - 12 (%)    Monocytes Absolute 0.3  0.1 - 1.0 (K/uL)    Eosinophils Relative 0  0 - 5 (%)    Eosinophils Absolute 0.0  0.0 - 0.7 (K/uL)    Basophils Relative 0  0 - 1 (%)    Basophils Absolute 0.0  0.0 - 0.1 (K/uL)   POCT I-STAT 3, BLOOD GAS (G3+)     Status: Abnormal   Collection Time   06/07/11 10:55 PM      Component Value Range Comment   pH, Arterial 7.311 (*) 7.350 - 7.450     pCO2 arterial 35.7  35.0 - 45.0 (mmHg)    pO2, Arterial 85.0  80.0 - 100.0 (mmHg)    Bicarbonate 18.0 (*) 20.0 - 24.0 (mEq/L)    TCO2 19  0 - 100 (mmol/L)    O2 Saturation 96.0      Acid-base deficit 7.0 (*) 0.0 - 2.0 (mmol/L)    Patient temperature 98.7 F      Collection site RADIAL, ALLEN'S TEST ACCEPTABLE      Drawn by RT      Sample type ARTERIAL     GLUCOSE, CAPILLARY     Status: Abnormal   Collection Time   06/07/11 11:38 PM      Component Value Range Comment   Glucose-Capillary 122 (*) 70 - 99 (mg/dL)   PROCALCITONIN     Status: Normal   Collection Time   06/07/11 11:45 PM      Component Value Range Comment   Procalcitonin 4.95     POCT I-STAT 3, BLOOD GAS (G3+)     Status: Abnormal   Collection Time   06/08/11  1:37 AM      Component Value Range Comment   pH, Arterial 7.297 (*) 7.350 - 7.450     pCO2 arterial 38.5  35.0 - 45.0 (mmHg)    pO2, Arterial 73.0 (*) 80.0 - 100.0 (mmHg)    Bicarbonate 18.8 (*) 20.0 - 24.0 (mEq/L)    TCO2 20  0 - 100 (mmol/L)    O2 Saturation 93.0  Acid-base deficit 7.0 (*) 0.0 - 2.0 (mmol/L)    Patient temperature 98.6 F      Collection site ARTERIAL LINE      Drawn by Operator      Sample type ARTERIAL     CBC     Status: Abnormal   Collection Time   06/08/11  2:30 AM      Component Value Range Comment   WBC 23.9 (*) 4.0 - 10.5 (K/uL)    RBC 4.29  4.22 - 5.81 (MIL/uL)    Hemoglobin 12.6 (*) 13.0 - 17.0 (g/dL)    HCT 16.1 (*) 09.6 - 52.0 (%)    MCV 87.6  78.0 - 100.0 (fL)    MCH 29.8  26.0 - 34.0 (pg)    MCHC 34.0  30.0 -  36.0 (g/dL)    RDW 04.5 (*) 40.9 - 15.5 (%)    Platelets 119 (*) 150 - 400 (K/uL) CONSISTENT WITH PREVIOUS RESULT  VANCOMYCIN, TROUGH     Status: Abnormal   Collection Time   06/08/11  2:30 AM      Component Value Range Comment   Vancomycin Tr 52.0 (*) 10.0 - 20.0 (ug/mL)   COMPREHENSIVE METABOLIC PANEL     Status: Abnormal   Collection Time   06/08/11  2:30 AM      Component Value Range Comment   Sodium 131 (*) 135 - 145 (mEq/L)    Potassium 5.2 (*) 3.5 - 5.1 (mEq/L)    Chloride 93 (*) 96 - 112 (mEq/L)    CO2 16 (*) 19 - 32 (mEq/L)    Glucose, Bld 137 (*) 70 - 99 (mg/dL)    BUN 811 (*) 6 - 23 (mg/dL) RESULT CONFIRMED BY AUTOMATED DILUTION   Creatinine, Ser 4.17 (*) 0.50 - 1.35 (mg/dL)    Calcium 7.4 (*) 8.4 - 10.5 (mg/dL)    Total Protein 6.6  6.0 - 8.3 (g/dL)    Albumin 2.8 (*) 3.5 - 5.2 (g/dL)    AST 85 (*) 0 - 37 (U/L)    ALT 37  0 - 53 (U/L)    Alkaline Phosphatase 54  39 - 117 (U/L)    Total Bilirubin 0.7  0.3 - 1.2 (mg/dL)    GFR calc non Af Amer 15 (*) >90 (mL/min)    GFR calc Af Amer 17 (*) >90 (mL/min)   PROCALCITONIN     Status: Normal   Collection Time   06/08/11  2:30 AM      Component Value Range Comment   Procalcitonin 4.91     TYPE AND SCREEN     Status: Normal   Collection Time   06/08/11  2:30 AM      Component Value Range Comment   ABO/RH(D) B POS      Antibody Screen NEG      Sample Expiration 06/11/2011     CORTISOL     Status: Normal   Collection Time   06/08/11  2:30 AM      Component Value Range Comment   Cortisol, Plasma 13.5     PROTIME-INR     Status: Abnormal   Collection Time   06/08/11  2:30 AM      Component Value Range Comment   Prothrombin Time 16.2 (*) 11.6 - 15.2 (seconds)    INR 1.27  0.00 - 1.49    APTT     Status: Normal   Collection Time   06/08/11  2:30 AM      Component Value Range  Comment   aPTT 29  24 - 37 (seconds)   CARBOXYHEMOGLOBIN     Status: Abnormal   Collection Time   06/08/11  3:00 AM      Component Value Range  Comment   Total hemoglobin 12.6 (*) 13.5 - 18.0 (g/dL)    O2 Saturation 16.1      Carboxyhemoglobin 0.7  0.5 - 1.5 (%)    Methemoglobin 0.8  0.0 - 1.5 (%)   GLUCOSE, CAPILLARY     Status: Abnormal   Collection Time   06/08/11  4:01 AM      Component Value Range Comment   Glucose-Capillary 150 (*) 70 - 99 (mg/dL)   CULTURE, BLOOD (ROUTINE X 2)     Status: Normal (Preliminary result)   Collection Time   06/08/11  4:15 AM      Component Value Range Comment   Specimen Description BLOOD LEFT HAND      Special Requests BOTTLES DRAWN AEROBIC ONLY 10CC      Culture  Setup Time [id number]      Culture        Value:        BLOOD CULTURE RECEIVED NO GROWTH TO DATE CULTURE WILL BE HELD FOR 5 DAYS BEFORE ISSUING A FINAL NEGATIVE REPORT   Report Status PENDING     CULTURE, BLOOD (ROUTINE X 2)     Status: Normal (Preliminary result)   Collection Time   06/08/11  4:20 AM      Component Value Range Comment   Specimen Description BLOOD LEFT HAND      Special Requests BOTTLES DRAWN AEROBIC ONLY 10CC      Culture  Setup Time [id number]      Culture        Value:        BLOOD CULTURE RECEIVED NO GROWTH TO DATE CULTURE WILL BE HELD FOR 5 DAYS BEFORE ISSUING A FINAL NEGATIVE REPORT   Report Status PENDING     CARBOXYHEMOGLOBIN     Status: Abnormal   Collection Time   06/08/11  5:35 AM      Component Value Range Comment   Total hemoglobin 12.7 (*) 13.5 - 18.0 (g/dL)    O2 Saturation 08.6      Carboxyhemoglobin 1.1  0.5 - 1.5 (%)    Methemoglobin 1.7 (*) 0.0 - 1.5 (%)   POCT I-STAT 3, BLOOD GAS (G3+)     Status: Abnormal   Collection Time   06/08/11  6:39 AM      Component Value Range Comment   pH, Arterial 7.300 (*) 7.350 - 7.450     pCO2 arterial 35.9  35.0 - 45.0 (mmHg)    pO2, Arterial 61.0 (*) 80.0 - 100.0 (mmHg)    Bicarbonate 17.8 (*) 20.0 - 24.0 (mEq/L)    TCO2 19  0 - 100 (mmol/L)    O2 Saturation 89.0      Acid-base deficit 8.0 (*) 0.0 - 2.0 (mmol/L)    Patient temperature 36.5 C        Collection site ARTERIAL LINE      Drawn by Nurse      Sample type ARTERIAL     GLUCOSE, CAPILLARY     Status: Abnormal   Collection Time   06/08/11  7:15 AM      Component Value Range Comment   Glucose-Capillary 147 (*) 70 - 99 (mg/dL)   CARDIAC PANEL(CRET KIN+CKTOT+MB+TROPI)     Status: Abnormal   Collection Time   06/08/11 10:00  AM      Component Value Range Comment   Total CK 246 (*) 7 - 232 (U/L)    CK, MB 5.7 (*) 0.3 - 4.0 (ng/mL)    Troponin I 0.59 (*) <0.30 (ng/mL)    Relative Index 2.3  0.0 - 2.5    POCT I-STAT 3, BLOOD GAS (G3+)     Status: Abnormal   Collection Time   06/08/11 10:32 AM      Component Value Range Comment   pH, Arterial 7.287 (*) 7.350 - 7.450     pCO2 arterial 35.5  35.0 - 45.0 (mmHg)    pO2, Arterial 76.0 (*) 80.0 - 100.0 (mmHg)    Bicarbonate 17.0 (*) 20.0 - 24.0 (mEq/L)    TCO2 18  0 - 100 (mmol/L)    O2 Saturation 94.0      Acid-base deficit 9.0 (*) 0.0 - 2.0 (mmol/L)    Patient temperature 98.0 F      Collection site ARTERIAL LINE      Sample type ARTERIAL     BASIC METABOLIC PANEL     Status: Abnormal   Collection Time   06/08/11 11:00 AM      Component Value Range Comment   Sodium 129 (*) 135 - 145 (mEq/L)    Potassium 5.6 (*) 3.5 - 5.1 (mEq/L)    Chloride 90 (*) 96 - 112 (mEq/L)    CO2 18 (*) 19 - 32 (mEq/L)    Glucose, Bld 172 (*) 70 - 99 (mg/dL)    BUN 119 (*) 6 - 23 (mg/dL) RESULT CONFIRMED BY AUTOMATED DILUTION   Creatinine, Ser 4.50 (*) 0.50 - 1.35 (mg/dL)    Calcium 6.9 (*) 8.4 - 10.5 (mg/dL)    GFR calc non Af Amer 14 (*) >90 (mL/min)    GFR calc Af Amer 16 (*) >90 (mL/min)   GLUCOSE, CAPILLARY     Status: Abnormal   Collection Time   06/08/11 12:19 PM      Component Value Range Comment   Glucose-Capillary 165 (*) 70 - 99 (mg/dL)   CARDIAC PANEL(CRET KIN+CKTOT+MB+TROPI)     Status: Abnormal   Collection Time   06/08/11  3:09 PM      Component Value Range Comment   Total CK 247 (*) 7 - 232 (U/L)    CK, MB 6.0 (*) 0.3 - 4.0  (ng/mL)    Troponin I 0.93 (*) <0.30 (ng/mL)    Relative Index 2.4  0.0 - 2.5    GLUCOSE, CAPILLARY     Status: Abnormal   Collection Time   06/08/11  3:53 PM      Component Value Range Comment   Glucose-Capillary 151 (*) 70 - 99 (mg/dL)   GLUCOSE, CAPILLARY     Status: Abnormal   Collection Time   06/08/11  7:30 PM      Component Value Range Comment   Glucose-Capillary 146 (*) 70 - 99 (mg/dL)   GLUCOSE, CAPILLARY     Status: Abnormal   Collection Time   06/08/11 11:39 PM      Component Value Range Comment   Glucose-Capillary 167 (*) 70 - 99 (mg/dL)   CARDIAC PANEL(CRET KIN+CKTOT+MB+TROPI)     Status: Abnormal   Collection Time   06/08/11 11:45 PM      Component Value Range Comment   Total CK 220  7 - 232 (U/L)    CK, MB 6.7 (*) 0.3 - 4.0 (ng/mL)    Troponin I 0.81 (*) <0.30 (ng/mL)  Relative Index 3.0 (*) 0.0 - 2.5    GLUCOSE, CAPILLARY     Status: Abnormal   Collection Time   06/09/11  3:49 AM      Component Value Range Comment   Glucose-Capillary 156 (*) 70 - 99 (mg/dL)    Comment 1 Notify RN     CBC     Status: Abnormal   Collection Time   06/09/11  4:41 AM      Component Value Range Comment   WBC 20.5 (*) 4.0 - 10.5 (K/uL)    RBC 3.82 (*) 4.22 - 5.81 (MIL/uL)    Hemoglobin 11.1 (*) 13.0 - 17.0 (g/dL)    HCT 93.7 (*) 16.9 - 52.0 (%)    MCV 87.2  78.0 - 100.0 (fL)    MCH 29.1  26.0 - 34.0 (pg)    MCHC 33.3  30.0 - 36.0 (g/dL)    RDW 67.8 (*) 93.8 - 15.5 (%)    Platelets 110 (*) 150 - 400 (K/uL) CONSISTENT WITH PREVIOUS RESULT  MAGNESIUM     Status: Normal   Collection Time   06/09/11  4:41 AM      Component Value Range Comment   Magnesium 2.4  1.5 - 2.5 (mg/dL)   VANCOMYCIN, RANDOM     Status: Normal   Collection Time   06/09/11  4:41 AM      Component Value Range Comment   Vancomycin Rm 38.1     RENAL FUNCTION PANEL     Status: Abnormal   Collection Time   06/09/11  4:41 AM      Component Value Range Comment   Sodium 130 (*) 135 - 145 (mEq/L)    Potassium 4.7   3.5 - 5.1 (mEq/L)    Chloride 93 (*) 96 - 112 (mEq/L)    CO2 20  19 - 32 (mEq/L)    Glucose, Bld 160 (*) 70 - 99 (mg/dL)    BUN 101 (*) 6 - 23 (mg/dL)    Creatinine, Ser 7.51 (*) 0.50 - 1.35 (mg/dL)    Calcium 7.2 (*) 8.4 - 10.5 (mg/dL)    Phosphorus 6.4 (*) 2.3 - 4.6 (mg/dL)    Albumin 2.5 (*) 3.5 - 5.2 (g/dL)    GFR calc non Af Amer 17 (*) >90 (mL/min)    GFR calc Af Amer 19 (*) >90 (mL/min)   OCCULT BLOOD X 1 CARD TO LAB, STOOL     Status: Normal   Collection Time   06/09/11  4:45 AM      Component Value Range Comment   Fecal Occult Bld POSITIVE     POCT I-STAT 3, BLOOD GAS (G3+)     Status: Abnormal   Collection Time   06/09/11  4:48 AM      Component Value Range Comment   pH, Arterial 7.341 (*) 7.350 - 7.450     pCO2 arterial 43.1  35.0 - 45.0 (mmHg)    pO2, Arterial 104.0 (*) 80.0 - 100.0 (mmHg)    Bicarbonate 23.8  20.0 - 24.0 (mEq/L)    TCO2 25  0 - 100 (mmol/L)    O2 Saturation 98.0      Acid-base deficit 3.0 (*) 0.0 - 2.0 (mmol/L)    Patient temperature 35.3 C      Collection site ARTERIAL LINE      Drawn by Nurse      Sample type ARTERIAL       Dg Chest Port 1 View  06/09/2011  *RADIOLOGY REPORT*  Clinical  Data: Pneumonia.  PORTABLE CHEST - 1 VIEW  Comparison: 06/08/2011  Findings: Cardiomegaly. Bilateral airspace disease and effusions, slightly worsened since prior study.  Support devices are in stable position.  IMPRESSION: Mild worsening bilateral airspace disease and effusions, edema versus infection.  Original Report Authenticated By: Cyndie Chime, M.D.   Dg Chest Port 1 View  06/08/2011  *RADIOLOGY REPORT*  Clinical Data: Central line placement and respiratory failure.  PORTABLE CHEST - 1 VIEW  Comparison: 06/07/2011  Findings: Right jugular central catheter has been placed since the prior chest x-ray.  The tip lies in the proximal SVC.  No pneumothorax present.  Endotracheal tube positioning stable with tip located approximately 2.5 cm above the carina.  Left  jugular central line and nasogastric tube positioning stable.  Persistent bilateral lower lobe atelectasis, left greater than right.  IMPRESSION: New right-sided jugular central catheter tip lies in the expected position of the proximal SVC.  No pneumothorax after the procedure.  Original Report Authenticated By: Reola Calkins, M.D.   Dg Chest Port 1 View  06/07/2011  *RADIOLOGY REPORT*  Clinical Data: Oxygen desaturation.  Patient on ventilator.  PORTABLE CHEST - 1 VIEW  Comparison: Plain film chest 06/06/1937 6:14 a.m.  Findings: Support tubes and lines are unchanged.  Marked enlargement of the cardiopericardial silhouette is again seen. Bibasilar airspace disease and likely small effusions persist but appear improved, particularly on the right.  No pneumothorax.  IMPRESSION: Some improvement in basilar airspace disease and small effusions, more notable on the right.  No new abnormality.  Original Report Authenticated By: Bernadene Bell. Maricela Curet, M.D.   Dg Abd Portable 1v  06/09/2011  *RADIOLOGY REPORT*  Clinical Data: Large residuals.  Evaluate for ileus or obstruction.  PORTABLE ABDOMEN - 1 VIEW  Comparison: 04/07/2009  Findings: There is a nonobstructive bowel gas pattern.  No evidence for ileus.  Decreasing gaseous distention since prior study.  No supine evidence of free air.  No acute bony abnormality.  IMPRESSION: No evidence of obstruction or ileus.  Original Report Authenticated By: Cyndie Chime, M.D.    ROS not obtainable. Blood pressure 91/55, pulse 129, temperature 96.1 F (35.6 C), temperature source Core (Comment), resp. rate 16, height 5\' 11"  (1.803 m), weight 147.5 kg (325 lb 2.9 oz), SpO2 94.00%. Presently on levophed, milrinone, and vasopressin drips. With [employer] on vent. JVP unable to see. No carotid bruits heard. Chest clear Heart rapid irregular rate. Prominent s3 Abdomen: soft.  Obese No apparent tenderness. Extremities: 3+ marked chronic pitting  edema.  Assessment: 1. Shock of uncertain etiology - suspect septic and cardiogenic 2. Atrial flutter with rapid ventricular response of uncertain duration 3. Cardiomyopathy ? Etiology.  There are regional wall motion abnormalities with severe depression of LV systolic function EF 15-20%.  This could be from chronic ischemic heart disease but no history of angina etc. Tachycardia-induced cardiomyopathy also a possibility. 4. Mildly elevated troponin consistent with enzyme leak secondary to tachycardia and shock rather than a primary cardiac event (MI). 5. VDRF 6. Acute on chronic renal failure s/p renal transplant  Rec:  He continues to be critically ill. Suspect he has components of septic and cardiogenic shock. Prominent S3 on exam. Will check co-ox. Agree with attempting volume removal with CVVHD as tolerated. Needs to get out of AFL or at least slow down. Will start amiodarone. If no effect, will consider TEE/DC-CV though I suspect it will be hard for him to maintain NSR given the adrenergic stress he  is under without significant amio load.  Zylon Creamer 06/09/2011, 8:26 AM

## 2011-06-09 NOTE — Progress Notes (Signed)
eLink Physician-Brief Progress Note Patient Name: Chad [name] DOB: 11-08-1955 MRN: [id number]  Date of Service  06/09/2011   HPI/Events of Note  Hypothermic despite CRRT warmer applied Large volume gastric residuals   eICU Interventions  Plan: Bair Hugger for hypothermia KUB to evaluate for ileus/obstruction Check gastric contents for blood   Intervention Category Minor Interventions: Clinical assessment - ordering diagnostic tests  Hinda Lindor 06/09/2011, 4:23 AM

## 2011-06-09 NOTE — Progress Notes (Signed)
UR Completed.  Osie Merkin Jane [phone] 06/09/2011  

## 2011-06-09 NOTE — Progress Notes (Signed)
Pt +hemocult and +gastrocult; Dr Kathrene Bongo notified; Order received to start heparin per CRRT protocol  Burnard Bunting, RN

## 2011-06-09 NOTE — Progress Notes (Signed)
Pt's HD catheter pulled out when pt being repositioned; Pressure held to site and bleeding stopped; Vaseline gauze and pressure dressing applied; Dr Eliott Nine and CCM notified; MD in enroute to place new HD catheter.  Burnard Bunting, RN

## 2011-06-09 NOTE — Progress Notes (Addendum)
ANTIBIOTIC CONSULT NOTE - FOLLOW UP  Pharmacy Consult for Vanco/Cefepime Indication: Septic shock, PNA  No Known Allergies  Patient Measurements: Height: 5\' 11"  (180.3 cm) Weight: 325 lb 2.9 oz (147.5 kg) IBW/kg (Calculated) : 75.3  Adjusted Body Weight:    Vital Signs: Temp: 97.5 F (36.4 C) (05/13 0845) Temp src: Core (Comment) (05/13 0000) BP: 105/72 mmHg (05/13 0800) Pulse Rate: 134  (05/13 0845) Intake/Output from previous day: 05/12 0701 - 05/13 0700 In: 3258.6 [I.V.:2331.1; NG/GT:690; IV Piggyback:237.5] Out: 2192 [Urine:105; Emesis/NG output:430] Intake/Output from this shift:    Labs:  Basename 06/09/11 0441 06/08/11 1100 06/08/11 0230 06/07/11 2105  WBC 20.5* -- 23.9* 15.9*  HGB 11.1* -- 12.6* 10.5*  PLT 110* -- 119* 88*  LABCREA -- -- -- --  CREATININE 3.78* 4.50* 4.17* --   Estimated Creatinine Clearance: 32.9 ml/min (by C-G formula based on Cr of 3.78).  Basename 06/09/11 0441 06/08/11 0230  VANCOTROUGH -- 52.0*  VANCOPEAK -- --  VANCORANDOM 38.1 --  GENTTROUGH -- --  GENTPEAK -- --  GENTRANDOM -- --  TOBRATROUGH -- --  TOBRAPEAK -- --  TOBRARND -- --  AMIKACINPEAK -- --  AMIKACINTROU -- --  AMIKACIN -- --     Microbiology: Recent Results (from the past 720 hour(s))  URINE CULTURE     Status: Normal   Collection Time   06/03/11 10:53 PM      Component Value Range Status Comment   Specimen Description URINE, RANDOM   Final    Special Requests ADDED [id number]   Final    Culture  Setup Time [id number]   Final    Colony Count >=100,000 COLONIES/ML   Final    Culture     Final    Value: Multiple bacterial morphotypes present, none predominant. Suggest appropriate recollection if clinically indicated.   Report Status 06/05/2011 FINAL   Final   CULTURE, BLOOD (ROUTINE X 2)     Status: Normal (Preliminary result)   Collection Time   06/03/11 11:45 PM      Component Value Range Status Comment   Specimen Description BLOOD RIGHT ARM   Final    Special Requests BOTTLES DRAWN AEROBIC AND ANAEROBIC 10CC   Final    Culture  Setup Time [id number]   Final    Culture     Final    Value:        BLOOD CULTURE RECEIVED NO GROWTH TO DATE CULTURE WILL BE HELD FOR 5 DAYS BEFORE ISSUING A FINAL NEGATIVE REPORT   Report Status PENDING   Incomplete   CULTURE, BLOOD (ROUTINE X 2)     Status: Normal (Preliminary result)   Collection Time   06/03/11 11:50 PM      Component Value Range Status Comment   Specimen Description BLOOD RIGHT ARM   Final    Special Requests     Final    Value: BOTTLES DRAWN AEROBIC AND ANAEROBIC 5CC AEROBOC 3CC ANAEROBIC   Culture  Setup Time [id number]   Final    Culture     Final    Value:        BLOOD CULTURE RECEIVED NO GROWTH TO DATE CULTURE WILL BE HELD FOR 5 DAYS BEFORE ISSUING A FINAL NEGATIVE REPORT   Report Status PENDING   Incomplete   MRSA PCR SCREENING     Status: Normal   Collection Time   06/04/11  3:08 AM      Component Value Range Status Comment   MRSA by  PCR NEGATIVE  NEGATIVE  Final   RESPIRATORY VIRUS PANEL (18 COMPONENTS)     Status: Abnormal   Collection Time   06/04/11  6:09 AM      Component Value Range Status Comment   Source - RVPAN NASAL MUCOSA   Final    Respiratory Syncytial Virus A NOT DETECTED   Final    Respiratory Syncytial Virus B NOT DETECTED   Final    Influenza A NOT DETECTED   Final    Influenza B NOT DETECTED   Final    Parainfluenza 1 NOT DETECTED   Final    Parainfluenza 2 NOT DETECTED   Final    Parainfluenza 3 DETECTED (*)  Final    Parainfluenza 4 NOT DETECTED   Final    Metapneumovirus NOT DETECTED   Final    [place] and Echovirus NOT DETECTED   Final    Rhinovirus NOT DETECTED   Final    Adenovirus B NOT DETECTED   Final    Adenovirus E NOT DETECTED   Final    CoronavirusNL63 NOT DETECTED   Final    CoronavirusHKU1 NOT DETECTED   Final    Coronavirus229E NOT DETECTED   Final    CoronavirusOC43 NOT DETECTED   Final   CULTURE, EXPECTORATED SPUTUM-ASSESSMENT      Status: Normal   Collection Time   06/04/11  7:48 AM      Component Value Range Status Comment   Specimen Description SPUTUM   Final    Special Requests NONE   Final    Sputum evaluation     Final    Value: MICROSCOPIC FINDINGS SUGGEST THAT THIS SPECIMEN IS NOT REPRESENTATIVE OF LOWER RESPIRATORY SECRETIONS. PLEASE RECOLLECT.     CALLED TO D ORTIZ,RN 06/04/11 0911 BY K SCHULTZ   Report Status 06/04/2011 FINAL   Final   AFB CULTURE WITH SMEAR     Status: Normal (Preliminary result)   Collection Time   06/04/11 10:30 AM      Component Value Range Status Comment   Specimen Description BRONCHIAL WASHINGS   Final    Special Requests NONE   Final    ACID FAST SMEAR NO ACID FAST BACILLI SEEN   Final    Culture     Final    Value: CULTURE WILL BE EXAMINED FOR 6 WEEKS BEFORE ISSUING A FINAL REPORT   Report Status PENDING   Incomplete   PNEUMOCYSTIS JIROVECI SMEAR BY DFA     Status: Normal   Collection Time   06/04/11 10:30 AM      Component Value Range Status Comment   Specimen Source-PJSRC BRONCHIAL WASHINGS   Final    Pneumocystis jiroveci Ag NEGATIVE   Final Performed at [facility]  FUNGUS CULTURE W SMEAR     Status: Normal (Preliminary result)   Collection Time   06/04/11 10:30 AM      Component Value Range Status Comment   Specimen Description BRONCHIAL WASHINGS   Final    Special Requests NONE   Final    Fungal Smear NO YEAST OR FUNGAL ELEMENTS SEEN   Final    Culture CULTURE IN PROGRESS FOR FOUR WEEKS   Final    Report Status PENDING   Incomplete   CULTURE, RESPIRATORY     Status: Normal   Collection Time   06/04/11 10:30 AM      Component Value Range Status Comment   Specimen Description BRONCHIAL WASHINGS   Final  Special Requests NONE   Final    Gram Stain     Final    Value: FEW WBC PRESENT,BOTH PMN AND MONONUCLEAR     NO SQUAMOUS EPITHELIAL CELLS SEEN     NO ORGANISMS SEEN   Culture NO GROWTH 2 DAYS   Final    Report Status 06/07/2011 FINAL   Final   HSV PCR      Status: Normal   Collection Time   06/04/11 10:30 AM      Component Value Range Status Comment   HSV, PCR Not Detected  Not Detected  Final    HSV 2 , PCR Not Detected  Not Detected  Final    Specimen Source-HSVPCR NO GROWTH   Final   CULTURE, BLOOD (ROUTINE X 2)     Status: Normal (Preliminary result)   Collection Time   06/04/11  3:15 PM      Component Value Range Status Comment   Specimen Description BLOOD RIGHT HAND   Final    Special Requests BOTTLES DRAWN AEROBIC AND ANAEROBIC 10CC   Final    Culture  Setup Time [id number]   Final    Culture     Final    Value:        BLOOD CULTURE RECEIVED NO GROWTH TO DATE CULTURE WILL BE HELD FOR 5 DAYS BEFORE ISSUING A FINAL NEGATIVE REPORT   Report Status PENDING   Incomplete   CULTURE, BLOOD (ROUTINE X 2)     Status: Normal (Preliminary result)   Collection Time   06/04/11  3:27 PM      Component Value Range Status Comment   Specimen Description BLOOD LEFT HAND   Final    Special Requests BOTTLES DRAWN AEROBIC ONLY 3CC   Final    Culture  Setup Time [id number]   Final    Culture     Final    Value:        BLOOD CULTURE RECEIVED NO GROWTH TO DATE CULTURE WILL BE HELD FOR 5 DAYS BEFORE ISSUING A FINAL NEGATIVE REPORT   Report Status PENDING   Incomplete   CULTURE, BLOOD (ROUTINE X 2)     Status: Normal (Preliminary result)   Collection Time   06/08/11  4:15 AM      Component Value Range Status Comment   Specimen Description BLOOD LEFT HAND   Final    Special Requests BOTTLES DRAWN AEROBIC ONLY 10CC   Final    Culture  Setup Time [id number]   Final    Culture     Final    Value:        BLOOD CULTURE RECEIVED NO GROWTH TO DATE CULTURE WILL BE HELD FOR 5 DAYS BEFORE ISSUING A FINAL NEGATIVE REPORT   Report Status PENDING   Incomplete   CULTURE, BLOOD (ROUTINE X 2)     Status: Normal (Preliminary result)   Collection Time   06/08/11  4:20 AM      Component Value Range Status Comment   Specimen Description BLOOD LEFT HAND   Final     Special Requests BOTTLES DRAWN AEROBIC ONLY 10CC   Final    Culture  Setup Time [id number]   Final    Culture     Final    Value:        BLOOD CULTURE RECEIVED NO GROWTH TO DATE CULTURE WILL BE HELD FOR 5 DAYS BEFORE ISSUING A FINAL NEGATIVE REPORT   Report Status PENDING   Incomplete  Anti-infectives     Start     Dose/Rate Route Frequency Ordered Stop   06/08/11 2200   ceFEPIme (MAXIPIME) 2 g in dextrose 5 % 50 mL IVPB     Comments: Use Cefepime 1 g IV q12h for CrCl< 60 mL/min      2 g 100 mL/hr over 30 Minutes Intravenous Every 12 hours 06/08/11 1536 06/12/11 0959   06/08/11 0800   piperacillin-tazobactam (ZOSYN) IVPB 3.375 g  Status:  Discontinued        3.375 g 12.5 mL/hr over 240 Minutes Intravenous Every 8 hours 06/08/11 0149 06/08/11 1155   06/08/11 0600   micafungin (MYCAMINE) 100 mg in sodium chloride 0.9 % 100 mL IVPB        100 mg 100 mL/hr over 1 Hours Intravenous Daily 06/08/11 0308     06/08/11 0200  piperacillin-tazobactam (ZOSYN) IVPB 3.375 g       3.375 g 100 mL/hr over 30 Minutes Intravenous  Once 06/08/11 0149 06/08/11 0254   06/06/11 0600   vancomycin (VANCOCIN) 1,750 mg in sodium chloride 0.9 % 500 mL IVPB  Status:  Discontinued        1,750 mg 250 mL/hr over 120 Minutes Intravenous Every 24 hours 06/05/11 0906 06/08/11 0403   06/05/11 2200   levofloxacin (LEVAQUIN) IVPB 750 mg  Status:  Discontinued     Comments: Use Levaquin 750 mg IV q48h for CrCl < 53mL/min      750 mg 100 mL/hr over 90 Minutes Intravenous Every 48 hours 06/04/11 0344 06/05/11 0908   06/05/11 1000   levofloxacin (LEVAQUIN) IVPB 750 mg  Status:  Discontinued     Comments: Use Levaquin 750 mg IV q48h for CrCl < 26mL/min      750 mg 100 mL/hr over 90 Minutes Intravenous Every 24 hours 06/05/11 0908 06/06/11 0908   06/04/11 1200   sulfamethoxazole-trimethoprim (BACTRIM) 664 mg in dextrose 5 % 500 mL IVPB  Status:  Discontinued        20 mg/kg/day  132.7 kg 361 mL/hr over 90  Minutes Intravenous 4 times per day 06/04/11 1039 06/06/11 0908   06/04/11 1100   micafungin (MYCAMINE) 100 mg in sodium chloride 0.9 % 100 mL IVPB  Status:  Discontinued        100 mg 100 mL/hr over 1 Hours Intravenous Daily 06/04/11 1030 06/06/11 0908   06/04/11 0600   ceFEPIme (MAXIPIME) 1 g in dextrose 5 % 50 mL IVPB  Status:  Discontinued     Comments: Use Cefepime 1 g IV q12h for CrCl< 60 mL/min      1 g 100 mL/hr over 30 Minutes Intravenous Every 12 hours 06/04/11 0344 06/08/11 1536   06/04/11 0600   vancomycin (VANCOCIN) 1,500 mg in sodium chloride 0.9 % 500 mL IVPB  Status:  Discontinued        1,500 mg 250 mL/hr over 120 Minutes Intravenous Every 24 hours 06/04/11 0425 06/05/11 0906   06/04/11 0430   levofloxacin (LEVAQUIN) IVPB 750 mg        750 mg 100 mL/hr over 90 Minutes Intravenous  Once 06/04/11 0354 06/04/11 0632   06/03/11 2330   vancomycin (VANCOCIN) IVPB 1000 mg/200 mL premix        1,000 mg 200 mL/hr over 60 Minutes Intravenous  Once 06/03/11 2326 06/04/11 0051   06/03/11 2330  piperacillin-tazobactam (ZOSYN) IVPB 3.375 g       3.375 g 12.5 mL/hr over 240 Minutes Intravenous  Once 06/03/11 2326  06/04/11 0521          Assessment: Admit Complaint: SOB, cough, fever x 3 days presented to ED 5/7, has hx renal transplant.  Infectious Disease: ?HCAP with immunosuppresion, Temp 96.1 now on [employer]. WBC up to 20.5. Random Vanco level still elevated at 38.1 now on CVVHD. Recheck in am to possible resume q24h dosing. Meds: Cefepime 2g/12h #6, Micafungin 100mg  IV/daily #6, Vanco #6-HOLD for levels  Abx: 5/7 Vanc >> 5/7 Levaquin > 5/10 5/7 Mycamine >5/10 5/7 Bactrim > 5/10 5/8 Cefepime 5/8 >>5/16 5/11 Zosyn >> 5/12 **no need for duplicate antipseudomonals/beta-lactam  Micro: 5/7 Urine: insignificant 5/7 Blood: ngtd 5/8 BAL: neg (AFB, fungal smear, PCP) 5/8 HSV PCR: neg 5/8 Blood: ngtd 5/8 Resp virus panel: Parainfluenza 3  positive  Cards:Cardiovascular: hx HTN/HLD. Septic and cardiogenic shock. EKG with Aflutter + RVR. Echo= EF 15-20% zocor d/c due to hx Hep C- LFTs WNL. BP 79/56 with HR 129 this am. Start Amiodarone today. Amiodarone drug interactions:  Meds: Milrinone 0.36mcg/kg/min, Norepi, Vasopres Amio drug interactions:  Major Macrolide Immunosuppressives / Amiodarone and Derivatives Administration of Amiodarone and Derivatives is likely to cause significantly increased blood concentrations of Macrolide Immunosuppressives (Sirolimus). Clinicians are advised to prospectively lower Macrolide Immunosuppressives dose, and frequently monitor blood concentrations in order to minimize prolonged periods of supratherapeutic concentrations and related toxicities. In addition, the potential exists especially in elderly patients for the occurrence of prolongation of the QT interval.    Goal of Therapy:  Vanco level <=20 before redosing  Plan:  Cefepime 2g IV q12h Vanco- Hold for levels. Random level Tuesday am.  Misty Stanley Stillinger 06/09/2011,8:51 AM

## 2011-06-09 NOTE — Progress Notes (Signed)
ASSName: Chad [name]. DERFLINGER MRN: [id number] DOB: [birth year]    LOS: 6  PCCM  NOTE  History of Present Illness: [name] s/p renal Tx, immune suppressed, adm 5/08 to [facility] service with 3 days of cough, shortness of breath and fever. Progressive resp failure resulting in intubation 5/8.   Lines / Drains: Left ij 5/8>>> ETT 5/8>>> Aline 5/8>>>out R IJ HD cath 5/12 >>   Cultures: BAL bronch 5/8>>>ng    AFB >> neg   Viral panel >> POS parainfluenza virus   Pneumocystis >> neg   Fungus >> neg HSV pcr neg BC 5/7>>>ng Sputum 5/7>>>poor spec Urine 5/7>>>contaminated  Antibiotics: 5/7 levoflox>>>5/10 5/7 bactrim >>>5/10 5/7 vanc >>  5/7 myco >>  5/10 cefepime >>  Tests / Events: 5/8 hypoxic resp failure, intubated, near arrest 5/8 FOB - diffuse airway edema, diffuse bloody mucoid secretions 5/11 worsening shock , on sepsis protocol + dobut for low co-ox 5/12 Echocardiogram:  LVEF 15-20%. Diffuse HK   Subj: RASS -2, not F/C. No WUA performed  Vital Signs: Temp:  [95.4 F (35.2 C)-98.1 F (36.7 C)] 96.3 F (35.7 C) (05/13 1400) Pulse Rate:  [54-144] 115  (05/13 1400) Resp:  [11-24] 15  (05/13 1400) BP: (83-128)/(54-86) 121/67 mmHg (05/13 1400) SpO2:  [72 %-99 %] 98 % (05/13 1400) Arterial Line BP: (78-130)/(51-87) 104/62 mmHg (05/13 1400) FiO2 (%):  [90 %-100 %] 90 % (05/13 1200) Weight:  [147.5 kg (325 lb 2.9 oz)] 147.5 kg (325 lb 2.9 oz) (05/13 0230) I/O last 3 completed shifts: In: 5748 [I.V.:3470.5; NG/GT:1140; IV Piggyback:1137.5] Out: 2268 [Urine:181; Emesis/NG output:430; Other:1657]  Physical Examination: General: Synchronous, sedated, not F/C Neuro:  Nonfocal, sedated HEENT:  Line clean Neck:  jvd increased Cardiovascular:  ST S2 S2 rrr Lungs:  ronchi crackles Abdomen:  Soft, bs wnl no r/g Lower ext- severe chronic edema  Ventilator settings: Vent Mode:  [-] PRVC FiO2 (%):  [90 %-100 %] 90 % Set Rate:  [15 bmp-16 bmp] 16 bmp Vt Set:  [450 mL] 450  mL PEEP:  [15 cmH20] 15 cmH20 Plateau Pressure:  [17 cmH20-22 cmH20] 22 cmH20  Labs and Imaging:    CBC    Component Value Date/Time   WBC 20.5* 06/09/2011 0441   RBC 3.82* 06/09/2011 0441   HGB 11.1* 06/09/2011 0441   HCT 33.3* 06/09/2011 0441   PLT 110* 06/09/2011 0441   MCV 87.2 06/09/2011 0441   MCH 29.1 06/09/2011 0441   MCHC 33.3 06/09/2011 0441   RDW 16.5* 06/09/2011 0441   LYMPHSABS 1.0 06/07/2011 2105   MONOABS 0.3 06/07/2011 2105   EOSABS 0.0 06/07/2011 2105   BASOSABS 0.0 06/07/2011 2105    BMET    Component Value Date/Time   NA 130* 06/09/2011 0441   K 4.7 06/09/2011 0441   CL 93* 06/09/2011 0441   CO2 20 06/09/2011 0441   GLUCOSE 160* 06/09/2011 0441   BUN 113* 06/09/2011 0441   CREATININE 3.78* 06/09/2011 0441   CALCIUM 7.2* 06/09/2011 0441   GFRNONAA 17* 06/09/2011 0441   GFRAA 19* 06/09/2011 0441    CXR: edema vs ARDS pattern  Assessment and Plan: 1. Pulmonary  - Acute respiratory failure  ARDS vs edema PNA, immune suppressed  Plan - - Cont ARDS protocol - Cont abx as per ID  2. CVS Cardiomyopathy of unclear etiology, Shock - cardiogenic and/or septic, PSVT/? A flutter - Amiodarone started per cards - pt is on 90% FiO2 > high risk of pulmonary toxicity. Monitor  closely - Wean pressors for MAP > 60 mmHg - Cont stress dose steroids  3. Renal Renal TX, ATN, worsening acidosis & mild hyperkalemia - Cont CVVH per Renal  4. ID PNA Immunocommprommsied host Empiric  HCAP   - abx Per ID  4. Heme Leukocytosis, low plat  - Monitor closely on SQ heparin  5. Endo - CBGs, SSI  6. MIld agitation - Cont sedation protocol - Daily WUA beginning 5/14  7. GI/ Nutrition - Cont TFs and PPI for SUP  8. H/o Hep C - monitor LFTs periodically    Best practices / Disposition: -->ICU status under PCCM -->full code -->Heparin for DVT Px sub q -->Protonix for GI Px -->ventilator bundle -->TFs  Critical Care Time devoted to patient care services described in this  note is 45 minutes.    Billy Fischer, MD;  PCCM service; Mobile [phone]

## 2011-06-09 NOTE — Progress Notes (Signed)
eLink Physician-Brief Progress Note Patient Name: Chad [name] DOB: Apr 28, 1955 MRN: [id number]  Date of Service  06/09/2011   HPI/Events of Note  HR remains in the 120s.   Dig dosed per cardiology with recommendation written that if HR greater than 120s to redose dig at same dose   eICU Interventions  Plan: Second 0.25 mg dose of dig given   Intervention Category Intermediate Interventions: Arrhythmia - evaluation and management  Sharese Manrique 06/09/2011, 12:03 AM

## 2011-06-10 ENCOUNTER — Inpatient Hospital Stay (HOSPITAL_COMMUNITY): Payer: BC Managed Care – PPO

## 2011-06-10 DIAGNOSIS — A419 Sepsis, unspecified organism: Secondary | ICD-10-CM | POA: Diagnosis present

## 2011-06-10 DIAGNOSIS — I420 Dilated cardiomyopathy: Secondary | ICD-10-CM | POA: Diagnosis present

## 2011-06-10 DIAGNOSIS — N179 Acute kidney failure, unspecified: Secondary | ICD-10-CM | POA: Diagnosis present

## 2011-06-10 DIAGNOSIS — R6521 Severe sepsis with septic shock: Secondary | ICD-10-CM | POA: Diagnosis present

## 2011-06-10 LAB — RENAL FUNCTION PANEL
Albumin: 2.6 g/dL — ABNORMAL LOW (ref 3.5–5.2)
Albumin: 2.6 g/dL — ABNORMAL LOW (ref 3.5–5.2)
BUN: 76 mg/dL — ABNORMAL HIGH (ref 6–23)
CO2: 23 mEq/L (ref 19–32)
Calcium: 7.7 mg/dL — ABNORMAL LOW (ref 8.4–10.5)
Chloride: 98 mEq/L (ref 96–112)
Creatinine, Ser: 3.26 mg/dL — ABNORMAL HIGH (ref 0.50–1.35)
Creatinine, Ser: 2.72 mg/dL — ABNORMAL HIGH (ref 0.50–1.35)
GFR calc non Af Amer: 20 mL/min — ABNORMAL LOW (ref >90)
Phosphorus: 5.8 mg/dL — ABNORMAL HIGH (ref 2.3–4.6)

## 2011-06-10 LAB — GLUCOSE, CAPILLARY
Glucose-Capillary: 140 mg/dL — ABNORMAL HIGH (ref 70–99)
Glucose-Capillary: 140 mg/dL — ABNORMAL HIGH (ref 70–99)

## 2011-06-10 LAB — POCT ACTIVATED CLOTTING TIME
Activated Clotting Time: 133 seconds
Activated Clotting Time: 166 seconds
Activated Clotting Time: 182 seconds
Activated Clotting Time: 188 seconds
Activated Clotting Time: 204 seconds
Activated Clotting Time: 204 seconds
Activated Clotting Time: 215 seconds

## 2011-06-10 LAB — MAGNESIUM: Magnesium: 2.6 mg/dL — ABNORMAL HIGH (ref 1.5–2.5)

## 2011-06-10 LAB — LEGIONELLA PROFILE(CULTURE+DFA/SMEAR): Legionella Antigen (DFA): NEGATIVE

## 2011-06-10 LAB — CBC
Platelets: 121 10*3/uL — ABNORMAL LOW (ref 150–400)
RDW: 16.6 % — ABNORMAL HIGH (ref 11.5–15.5)
WBC: 29.7 10*3/uL — ABNORMAL HIGH (ref 4.0–10.5)

## 2011-06-10 LAB — CULTURE, BLOOD (ROUTINE X 2)
Culture  Setup Time: 201305080847
Culture: NO GROWTH

## 2011-06-10 LAB — SIROLIMUS LEVEL: Sirolimus (Rapamycin): 2 ng/mL

## 2011-06-10 LAB — VANCOMYCIN, RANDOM: Vancomycin Rm: 32.2 ug/mL

## 2011-06-10 MED ORDER — PHENYLEPHRINE HCL 10 MG/ML IJ SOLN
30.0000 ug/min | INTRAVENOUS | Status: DC
  Administered 2011-06-10: 50 ug/min via INTRAVENOUS
  Administered 2011-06-11 (×2): 140 ug/min via INTRAVENOUS
  Administered 2011-06-11: 135 ug/min via INTRAVENOUS
  Administered 2011-06-11: 140 ug/min via INTRAVENOUS
  Administered 2011-06-12: 80 ug/min via INTRAVENOUS
  Administered 2011-06-12: 60 ug/min via INTRAVENOUS
  Administered 2011-06-12: 75 ug/min via INTRAVENOUS
  Administered 2011-06-13: 80 ug/min via INTRAVENOUS
  Administered 2011-06-13: 30 ug/min via INTRAVENOUS
  Filled 2011-06-10 (×11): qty 4

## 2011-06-10 MED ORDER — WHITE PETROLATUM GEL
Status: AC
  Administered 2011-06-10: 16:00:00
  Filled 2011-06-10: qty 5

## 2011-06-10 MED ORDER — MIDAZOLAM HCL 2 MG/2ML IJ SOLN
INTRAMUSCULAR | Status: AC
  Administered 2011-06-10: 4 mg
  Filled 2011-06-10: qty 4

## 2011-06-10 MED ORDER — OXEPA PO LIQD
1000.0000 mL | ORAL | Status: DC
  Administered 2011-06-12 – 2011-06-23 (×11): 1000 mL
  Filled 2011-06-10 (×16): qty 1000

## 2011-06-10 NOTE — Consult Note (Signed)
Reason for Consult:Cardiomyopathy Referring Physician: CCM  Chad [name] is an 56 y.o. male.   Chad [name] is a 56 year old man with past history of remote renal transplant and immunosuppression was admitted with worsening cough fever and dyspnea. Found to have PNA and newly reduced EF 15-20%.  Has been tachycardic since admission and EKG shows atrial flutter with rapid ventricular response.  Amiodarone started 5/13.  Remains on triple pressors with marginal BP. On CVVHD. Negative 1.1L. Weight up ? 30 pounds from admit   Was hypothermic and now on [employer].   Vent now at 80% FiO2 and high PEEP 15. Responds to pain.   WBC continues to increase. Co-ox inaccurate.  Past Medical History  Diagnosis Date  . Hypertension   . H/O kidney transplant     Past Surgical History  Procedure Date  . Nephrectomy transplanted organ     History reviewed. No pertinent family history.  Social History:  reports that he has never smoked. He does not have any smokeless tobacco history on file. He reports that he does not drink alcohol. His drug history not on file.  Allergies: No Known Allergies  Medications:  Scheduled:    . amiodarone  150 mg Intravenous Once  . antiseptic oral rinse  15 mL Mouth Rinse QID  . calcitRIOL  1 mcg Oral Q M,W,F  . ceFEPime (MAXIPIME) IV  2 g Intravenous Q12H  . chlorhexidine  15 mL Mouth Rinse BID  . collagenase   Topical Daily  . feeding supplement (OXEPA)  1,000 mL Per Tube Q24H  . feeding supplement  30 mL Oral BID WC  . heparin subcutaneous  5,000 Units Subcutaneous Q8H  . hydrocortisone sod succinate (SOLU-CORTEF) injection  50 mg Intravenous Q6H  . insulin aspart  0-4 Units Subcutaneous Q4H  . micafungin (MYCAMINE) IV  100 mg Intravenous Daily  . midazolam      . midazolam      . mulitivitamin  5 mL Per Tube Daily  . pantoprazole sodium  40 mg Per Tube Q1200  . sirolimus  0.75 mg Oral Daily  . DISCONTD: sodium chloride  500 mL Intravenous Once     Results for orders placed during the hospital encounter of 06/03/11 (from the past 48 hour(s))  POCT I-STAT 3, BLOOD GAS (G3+)     Status: Abnormal   Collection Time   06/08/11  6:39 AM      Component Value Range Comment   pH, Arterial 7.300 (*) 7.350 - 7.450     pCO2 arterial 35.9  35.0 - 45.0 (mmHg)    pO2, Arterial 61.0 (*) 80.0 - 100.0 (mmHg)    Bicarbonate 17.8 (*) 20.0 - 24.0 (mEq/L)    TCO2 19  0 - 100 (mmol/L)    O2 Saturation 89.0      Acid-base deficit 8.0 (*) 0.0 - 2.0 (mmol/L)    Patient temperature 36.5 C      Collection site ARTERIAL LINE      Drawn by Nurse      Sample type ARTERIAL     GLUCOSE, CAPILLARY     Status: Abnormal   Collection Time   06/08/11  7:15 AM      Component Value Range Comment   Glucose-Capillary 147 (*) 70 - 99 (mg/dL)   CARDIAC PANEL(CRET KIN+CKTOT+MB+TROPI)     Status: Abnormal   Collection Time   06/08/11 10:00 AM      Component Value Range Comment   Total CK 246 (*)  7 - 232 (U/L)    CK, MB 5.7 (*) 0.3 - 4.0 (ng/mL)    Troponin I 0.59 (*) <0.30 (ng/mL)    Relative Index 2.3  0.0 - 2.5    POCT I-STAT 3, BLOOD GAS (G3+)     Status: Abnormal   Collection Time   06/08/11 10:32 AM      Component Value Range Comment   pH, Arterial 7.287 (*) 7.350 - 7.450     pCO2 arterial 35.5  35.0 - 45.0 (mmHg)    pO2, Arterial 76.0 (*) 80.0 - 100.0 (mmHg)    Bicarbonate 17.0 (*) 20.0 - 24.0 (mEq/L)    TCO2 18  0 - 100 (mmol/L)    O2 Saturation 94.0      Acid-base deficit 9.0 (*) 0.0 - 2.0 (mmol/L)    Patient temperature 98.0 F      Collection site ARTERIAL LINE      Sample type ARTERIAL     BASIC METABOLIC PANEL     Status: Abnormal   Collection Time   06/08/11 11:00 AM      Component Value Range Comment   Sodium 129 (*) 135 - 145 (mEq/L)    Potassium 5.6 (*) 3.5 - 5.1 (mEq/L)    Chloride 90 (*) 96 - 112 (mEq/L)    CO2 18 (*) 19 - 32 (mEq/L)    Glucose, Bld 172 (*) 70 - 99 (mg/dL)    BUN 161 (*) 6 - 23 (mg/dL) RESULT CONFIRMED BY AUTOMATED  DILUTION   Creatinine, Ser 4.50 (*) 0.50 - 1.35 (mg/dL)    Calcium 6.9 (*) 8.4 - 10.5 (mg/dL)    GFR calc non Af Amer 14 (*) >90 (mL/min)    GFR calc Af Amer 16 (*) >90 (mL/min)   GLUCOSE, CAPILLARY     Status: Abnormal   Collection Time   06/08/11 12:19 PM      Component Value Range Comment   Glucose-Capillary 165 (*) 70 - 99 (mg/dL)   CARDIAC PANEL(CRET KIN+CKTOT+MB+TROPI)     Status: Abnormal   Collection Time   06/08/11  3:09 PM      Component Value Range Comment   Total CK 247 (*) 7 - 232 (U/L)    CK, MB 6.0 (*) 0.3 - 4.0 (ng/mL)    Troponin I 0.93 (*) <0.30 (ng/mL)    Relative Index 2.4  0.0 - 2.5    GLUCOSE, CAPILLARY     Status: Abnormal   Collection Time   06/08/11  3:53 PM      Component Value Range Comment   Glucose-Capillary 151 (*) 70 - 99 (mg/dL)   GLUCOSE, CAPILLARY     Status: Abnormal   Collection Time   06/08/11  7:30 PM      Component Value Range Comment   Glucose-Capillary 146 (*) 70 - 99 (mg/dL)   GLUCOSE, CAPILLARY     Status: Abnormal   Collection Time   06/08/11 11:39 PM      Component Value Range Comment   Glucose-Capillary 167 (*) 70 - 99 (mg/dL)   CARDIAC PANEL(CRET KIN+CKTOT+MB+TROPI)     Status: Abnormal   Collection Time   06/08/11 11:45 PM      Component Value Range Comment   Total CK 220  7 - 232 (U/L)    CK, MB 6.7 (*) 0.3 - 4.0 (ng/mL)    Troponin I 0.81 (*) <0.30 (ng/mL)    Relative Index 3.0 (*) 0.0 - 2.5    GLUCOSE, CAPILLARY  Status: Abnormal   Collection Time   06/09/11  3:49 AM      Component Value Range Comment   Glucose-Capillary 156 (*) 70 - 99 (mg/dL)    Comment 1 Notify RN     CBC     Status: Abnormal   Collection Time   06/09/11  4:41 AM      Component Value Range Comment   WBC 20.5 (*) 4.0 - 10.5 (K/uL)    RBC 3.82 (*) 4.22 - 5.81 (MIL/uL)    Hemoglobin 11.1 (*) 13.0 - 17.0 (g/dL)    HCT 40.9 (*) 81.1 - 52.0 (%)    MCV 87.2  78.0 - 100.0 (fL)    MCH 29.1  26.0 - 34.0 (pg)    MCHC 33.3  30.0 - 36.0 (g/dL)    RDW 91.4  (*) 78.2 - 15.5 (%)    Platelets 110 (*) 150 - 400 (K/uL) CONSISTENT WITH PREVIOUS RESULT  MAGNESIUM     Status: Normal   Collection Time   06/09/11  4:41 AM      Component Value Range Comment   Magnesium 2.4  1.5 - 2.5 (mg/dL)   VANCOMYCIN, RANDOM     Status: Normal   Collection Time   06/09/11  4:41 AM      Component Value Range Comment   Vancomycin Rm 38.1     RENAL FUNCTION PANEL     Status: Abnormal   Collection Time   06/09/11  4:41 AM      Component Value Range Comment   Sodium 130 (*) 135 - 145 (mEq/L)    Potassium 4.7  3.5 - 5.1 (mEq/L)    Chloride 93 (*) 96 - 112 (mEq/L)    CO2 20  19 - 32 (mEq/L)    Glucose, Bld 160 (*) 70 - 99 (mg/dL)    BUN 956 (*) 6 - 23 (mg/dL)    Creatinine, Ser 2.13 (*) 0.50 - 1.35 (mg/dL)    Calcium 7.2 (*) 8.4 - 10.5 (mg/dL)    Phosphorus 6.4 (*) 2.3 - 4.6 (mg/dL)    Albumin 2.5 (*) 3.5 - 5.2 (g/dL)    GFR calc non Af Amer 17 (*) >90 (mL/min)    GFR calc Af Amer 19 (*) >90 (mL/min)   OCCULT BLOOD X 1 CARD TO LAB, STOOL     Status: Normal   Collection Time   06/09/11  4:45 AM      Component Value Range Comment   Fecal Occult Bld POSITIVE     POCT I-STAT 3, BLOOD GAS (G3+)     Status: Abnormal   Collection Time   06/09/11  4:48 AM      Component Value Range Comment   pH, Arterial 7.341 (*) 7.350 - 7.450     pCO2 arterial 43.1  35.0 - 45.0 (mmHg)    pO2, Arterial 104.0 (*) 80.0 - 100.0 (mmHg)    Bicarbonate 23.8  20.0 - 24.0 (mEq/L)    TCO2 25  0 - 100 (mmol/L)    O2 Saturation 98.0      Acid-base deficit 3.0 (*) 0.0 - 2.0 (mmol/L)    Patient temperature 35.3 C      Collection site ARTERIAL LINE      Drawn by Nurse      Sample type ARTERIAL     GLUCOSE, CAPILLARY     Status: Abnormal   Collection Time   06/09/11  7:57 AM      Component Value Range Comment   Glucose-Capillary  129 (*) 70 - 99 (mg/dL)   CARBOXYHEMOGLOBIN     Status: Abnormal   Collection Time   06/09/11  9:25 AM      Component Value Range Comment   Total hemoglobin 11.5  (*) 13.5 - 18.0 (g/dL)    O2 Saturation 11.9      Carboxyhemoglobin 1.4  0.5 - 1.5 (%)    Methemoglobin 1.8 (*) 0.0 - 1.5 (%)   POCT GASTRIC OCCULT BLOOD     Status: Abnormal   Collection Time   06/09/11 10:31 AM      Component Value Range Comment   pH, Gastric NOT DONE      Occult Blood, Gastric POSITIVE (*) NEGATIVE    GLUCOSE, CAPILLARY     Status: Abnormal   Collection Time   06/09/11 12:08 PM      Component Value Range Comment   Glucose-Capillary 143 (*) 70 - 99 (mg/dL)   POCT ACTIVATED CLOTTING TIME     Status: Normal   Collection Time   06/09/11 12:59 PM      Component Value Range Comment   Activated Clotting Time 105     POCT ACTIVATED CLOTTING TIME     Status: Normal   Collection Time   06/09/11  2:13 PM      Component Value Range Comment   Activated Clotting Time 111     POCT ACTIVATED CLOTTING TIME     Status: Normal   Collection Time   06/09/11  3:18 PM      Component Value Range Comment   Activated Clotting Time 144     RENAL FUNCTION PANEL     Status: Abnormal   Collection Time   06/09/11  3:35 PM      Component Value Range Comment   Sodium 131 (*) 135 - 145 (mEq/L)    Potassium 4.6  3.5 - 5.1 (mEq/L)    Chloride 93 (*) 96 - 112 (mEq/L)    CO2 21  19 - 32 (mEq/L)    Glucose, Bld 146 (*) 70 - 99 (mg/dL)    BUN 98 (*) 6 - 23 (mg/dL)    Creatinine, Ser 1.47 (*) 0.50 - 1.35 (mg/dL)    Calcium 7.4 (*) 8.4 - 10.5 (mg/dL)    Phosphorus 5.9 (*) 2.3 - 4.6 (mg/dL)    Albumin 2.7 (*) 3.5 - 5.2 (g/dL)    GFR calc non Af Amer 19 (*) >90 (mL/min)    GFR calc Af Amer 23 (*) >90 (mL/min)   GLUCOSE, CAPILLARY     Status: Abnormal   Collection Time   06/09/11  3:37 PM      Component Value Range Comment   Glucose-Capillary 138 (*) 70 - 99 (mg/dL)   POCT ACTIVATED CLOTTING TIME     Status: Normal   Collection Time   06/09/11  4:27 PM      Component Value Range Comment   Activated Clotting Time 166     POCT ACTIVATED CLOTTING TIME     Status: Normal   Collection Time    06/09/11  5:32 PM      Component Value Range Comment   Activated Clotting Time 138     GLUCOSE, CAPILLARY     Status: Abnormal   Collection Time   06/09/11  7:54 PM      Component Value Range Comment   Glucose-Capillary 140 (*) 70 - 99 (mg/dL)   GLUCOSE, CAPILLARY     Status: Abnormal   Collection Time   06/09/11  11:45 PM      Component Value Range Comment   Glucose-Capillary 132 (*) 70 - 99 (mg/dL)    Comment 1 Documented in Chart      Comment 2 Notify RN     POCT ACTIVATED CLOTTING TIME     Status: Normal   Collection Time   06/10/11 12:06 AM      Component Value Range Comment   Activated Clotting Time 133     POCT ACTIVATED CLOTTING TIME     Status: Normal   Collection Time   06/10/11  1:04 AM      Component Value Range Comment   Activated Clotting Time 155     POCT ACTIVATED CLOTTING TIME     Status: Normal   Collection Time   06/10/11  2:04 AM      Component Value Range Comment   Activated Clotting Time 155     POCT ACTIVATED CLOTTING TIME     Status: Normal   Collection Time   06/10/11  3:15 AM      Component Value Range Comment   Activated Clotting Time 166     GLUCOSE, CAPILLARY     Status: Abnormal   Collection Time   06/10/11  3:58 AM      Component Value Range Comment   Glucose-Capillary 140 (*) 70 - 99 (mg/dL)    Comment 1 Documented in Chart      Comment 2 Notify RN     POCT ACTIVATED CLOTTING TIME     Status: Normal   Collection Time   06/10/11  4:04 AM      Component Value Range Comment   Activated Clotting Time 188     POCT ACTIVATED CLOTTING TIME     Status: Normal   Collection Time   06/10/11  5:15 AM      Component Value Range Comment   Activated Clotting Time 182       Dg Chest Port 1 View  06/09/2011  *RADIOLOGY REPORT*  Clinical Data: H D cath placement  PORTABLE CHEST - 1 VIEW  Comparison: 06/09/2011 at 0527 hours  Findings: Endotracheal tube with tip about 3.5 cm above the carina. Enteric tube tip is not visualized but appears to be below the left  hemidiaphragm, likely in the stomach.  Right-sided central venous catheter placed.  Tip is difficult to visualize but appears to be in the low SVC region.  Left central venous catheter placed with tip in the low SVC region.  No pneumothorax.  Persistent infiltration in the right lung base with right pleural effusion. Cardiac enlargement.  IMPRESSION: Appliances appear to be in satisfactory location.  No pneumothorax. Persistent right pleural effusion with basilar atelectasis or infiltration.  Original Report Authenticated By: Marlon Pel, M.D.   Dg Chest Port 1 View  06/09/2011  *RADIOLOGY REPORT*  Clinical Data: Pneumonia.  PORTABLE CHEST - 1 VIEW  Comparison: 06/08/2011  Findings: Cardiomegaly. Bilateral airspace disease and effusions, slightly worsened since prior study.  Support devices are in stable position.  IMPRESSION: Mild worsening bilateral airspace disease and effusions, edema versus infection.  Original Report Authenticated By: Cyndie Chime, M.D.   Dg Chest Port 1 View  06/08/2011  *RADIOLOGY REPORT*  Clinical Data: Central line placement and respiratory failure.  PORTABLE CHEST - 1 VIEW  Comparison: 06/07/2011  Findings: Right jugular central catheter has been placed since the prior chest x-ray.  The tip lies in the proximal SVC.  No pneumothorax present.  Endotracheal tube positioning  stable with tip located approximately 2.5 cm above the carina.  Left jugular central line and nasogastric tube positioning stable.  Persistent bilateral lower lobe atelectasis, left greater than right.  IMPRESSION: New right-sided jugular central catheter tip lies in the expected position of the proximal SVC.  No pneumothorax after the procedure.  Original Report Authenticated By: Reola Calkins, M.D.   Dg Abd Portable 1v  06/09/2011  *RADIOLOGY REPORT*  Clinical Data: Large residuals.  Evaluate for ileus or obstruction.  PORTABLE ABDOMEN - 1 VIEW  Comparison: 04/07/2009  Findings: There is a  nonobstructive bowel gas pattern.  No evidence for ileus.  Decreasing gaseous distention since prior study.  No supine evidence of free air.  No acute bony abnormality.  IMPRESSION: No evidence of obstruction or ileus.  Original Report Authenticated By: Cyndie Chime, M.D.    ROS not obtainable. Blood pressure 100/56, pulse 137, temperature 97.9 F (36.6 C), temperature source Core (Comment), resp. rate 18, height 5\' 11"  (1.803 m), weight 147.5 kg (325 lb 2.9 oz), SpO2 97.00%. Presently on levophed, milrinone, and vasopressin drips. With [employer] on vent. Arouses to voice and will squeeze my fingers JVP unable to see. No carotid bruits heard. Chest clear Heart rapid irregular rate. Prominent s3 Abdomen: soft.  Obese No apparent tenderness. Extremities: 4+ marked chronic pitting edema. weeping  Tele: Probable afl/atach with frequent PVCs/bigeminy  Assessment: 1. Shock , multifactorial - septic/cardiogenic 2. Atrial flutter with rapid ventricular response of uncertain duration 3. Cardiomyopathy ? Etiology.  There are regional wall motion abnormalities with severe depression of LV systolic function EF 15-20%.  This could be from chronic ischemic heart disease but no history of angina etc. Tachycardia-induced cardiomyopathy also a possibility. 4. Mildly elevated troponin consistent with enzyme leak secondary to tachycardia and shock rather than a primary cardiac event (MI). 5. PNA 6. VDRF 7. Acute on chronic renal failure s/p renal transplant  Rec:  He continues to be critically ill with components of septic and cardiogenic shock. He is massively volume overloaded. Continue pressors per CCM. Agree with continued volume removal with CVVHD as tolerated. Remains in AFL. Will continue amiodarone as unable to give b-blocker due to shock. I have little else to offer at this point. Will follow. Prognosis extremely guarded.    Ephrem Carrick 06/10/2011, 6:06 AM

## 2011-06-10 NOTE — Progress Notes (Signed)
06/09/11 2300   Elink MD notified of increased residual of 550. Residual is dark brow/black. Orders received to place OGT to Orma Render, [employer]

## 2011-06-10 NOTE — Progress Notes (Signed)
Subjective:  Remains critically ill on pressors.  CRRT running but did have catheter dislodged.  Started some heparin with CRRT and no reported clotting.  From the computer looks like norepi is off and he is making urine, but these are charting errors.    Objective Vital signs in last 24 hours: Filed Vitals:   06/10/11 0630 06/10/11 0645 06/10/11 0700 06/10/11 0715  BP: 121/91 89/51 107/59 99/56  Pulse: 145 136 142 78  Temp:      TempSrc:      Resp: 17 17 16 16   Height:      Weight:      SpO2: 99% 96% 97% 98%   Weight change: -0.3 kg (-10.6 oz)  Intake/Output Summary (Last 24 hours) at 06/10/11 0735 Last data filed at 06/10/11 0700  Gross per 24 hour  Intake 2514.21 ml  Output   4453 ml  Net -1938.79 ml   Labs: Basic Metabolic Panel:  Lab 06/10/11 1610 06/09/11 1535 06/09/11 0441  NA 134* 131* 130*  K 4.7 4.6 4.7  CL 94* 93* 93*  CO2 23 21 20   GLUCOSE 133* 146* 160*  BUN 94* 98* 113*  CREATININE 3.26* 3.33* 3.78*  CALCIUM 7.7* 7.4* 7.2*  ALB -- -- --  PHOS 5.8* 5.9* 6.4*   Liver Function Tests:  Lab 06/10/11 0500 06/09/11 1535 06/09/11 0441 06/08/11 0230 06/07/11 0500 06/06/11 0500  AST -- -- -- 85* 34 32  ALT -- -- -- 37 16 16  ALKPHOS -- -- -- 54 44 36*  BILITOT -- -- -- 0.7 0.4 0.4  PROT -- -- -- 6.6 6.1 6.1  ALBUMIN 2.6* 2.7* 2.5* -- -- --    Lab 06/03/11 2255  LIPASE 55  AMYLASE --   No results found for this basename: AMMONIA:3 in the last 168 hours CBC:  Lab 06/10/11 0500 06/09/11 0441 06/08/11 0230 06/07/11 2105 06/07/11 0500 06/06/11 0500  WBC 29.7* 20.5* 23.9* -- -- --  NEUTROABS -- -- -- 14.6* 14.4* 16.6*  HGB 11.1* 11.1* 12.6* -- -- --  HCT 32.1* 33.3* 37.6* -- -- --  MCV 85.8 87.2 87.6 87.3 87.1 --  PLT 121* 110* 119* -- -- --   Cardiac Enzymes:  Lab 06/08/11 2345 06/08/11 1509 06/08/11 1000 06/07/11 2105 06/04/11 1410  CKTOTAL 220 247* 246* 138 163  CKMB 6.7* 6.0* 5.7* 2.9 2.1  CKMBINDEX -- -- -- -- --  TROPONINI 0.81* 0.93* 0.59*  <0.30 <0.30   CBG:  Lab 06/10/11 0358 06/09/11 2345 06/09/11 1954 06/09/11 1537 06/09/11 1208  GLUCAP 140* 132* 140* 138* 143*    Iron Studies: No results found for this basename: IRON,TIBC,TRANSFERRIN,FERRITIN in the last 72 hours Studies/Results: Dg Chest Port 1 View  06/10/2011  *RADIOLOGY REPORT*  Clinical Data: Respiratory failure.  Short of breath.  PORTABLE CHEST - 1 VIEW  Comparison: 06/09/2011.  Findings: Cardiomegaly.  Patient rotated to the left.  Endotracheal tube and enteric tube appear unchanged.  The right IJ vascular sheath appears remain present.  Pleural effusions, basilar atelectasis and mild pulmonary edema persist.  Aeration is unchanged compared to prior allowing for technique.  The left IJ central line tip is difficult to visualize due to overlying leads. Study is technically suboptimal, under penetrated due to the airspace disease.  IMPRESSION: No interval change.  Stable support apparatus.  Original Report Authenticated By: Andreas Newport, M.D.   Dg Chest Port 1 View  06/09/2011  *RADIOLOGY REPORT*  Clinical Data: H D cath placement  PORTABLE CHEST -  1 VIEW  Comparison: 06/09/2011 at 0527 hours  Findings: Endotracheal tube with tip about 3.5 cm above the carina. Enteric tube tip is not visualized but appears to be below the left hemidiaphragm, likely in the stomach.  Right-sided central venous catheter placed.  Tip is difficult to visualize but appears to be in the low SVC region.  Left central venous catheter placed with tip in the low SVC region.  No pneumothorax.  Persistent infiltration in the right lung base with right pleural effusion. Cardiac enlargement.  IMPRESSION: Appliances appear to be in satisfactory location.  No pneumothorax. Persistent right pleural effusion with basilar atelectasis or infiltration.  Original Report Authenticated By: Marlon Pel, M.D.   Dg Chest Port 1 View  06/09/2011  *RADIOLOGY REPORT*  Clinical Data: Pneumonia.  PORTABLE CHEST - 1  VIEW  Comparison: 06/08/2011  Findings: Cardiomegaly. Bilateral airspace disease and effusions, slightly worsened since prior study.  Support devices are in stable position.  IMPRESSION: Mild worsening bilateral airspace disease and effusions, edema versus infection.  Original Report Authenticated By: Cyndie Chime, M.D.   Dg Chest Port 1 View  06/08/2011  *RADIOLOGY REPORT*  Clinical Data: Central line placement and respiratory failure.  PORTABLE CHEST - 1 VIEW  Comparison: 06/07/2011  Findings: Right jugular central catheter has been placed since the prior chest x-ray.  The tip lies in the proximal SVC.  No pneumothorax present.  Endotracheal tube positioning stable with tip located approximately 2.5 cm above the carina.  Left jugular central line and nasogastric tube positioning stable.  Persistent bilateral lower lobe atelectasis, left greater than right.  IMPRESSION: New right-sided jugular central catheter tip lies in the expected position of the proximal SVC.  No pneumothorax after the procedure.  Original Report Authenticated By: Reola Calkins, M.D.   Dg Abd Portable 1v  06/09/2011  *RADIOLOGY REPORT*  Clinical Data: Large residuals.  Evaluate for ileus or obstruction.  PORTABLE ABDOMEN - 1 VIEW  Comparison: 04/07/2009  Findings: There is a nonobstructive bowel gas pattern.  No evidence for ileus.  Decreasing gaseous distention since prior study.  No supine evidence of free air.  No acute bony abnormality.  IMPRESSION: No evidence of obstruction or ileus.  Original Report Authenticated By: Cyndie Chime, M.D.   Medications: Infusions:    . sodium chloride 20 mL/hr at 06/09/11 2000  . amiodarone (NEXTERONE PREMIX) 360 mg/200 mL dextrose 1 mg/min (06/09/11 1406)   Followed by  . amiodarone (NEXTERONE PREMIX) 360 mg/200 mL dextrose 0.5 mg/min (06/09/11 2000)  . fentaNYL infusion INTRAVENOUS 100 mcg/hr (06/09/11 2135)  . heparin 10,000 units/ 20 mL infusion syringe 1,350 Units/hr (06/10/11  0700)  . milrinone 0.25 mcg/kg/min (06/10/11 0018)  . norepinephrine (LEVOPHED) Adult infusion 25 mcg/min (06/09/11 2136)  . dialysis replacement fluid (prismasate) 300 mL/hr at 06/09/11 1026  . dialysis replacement fluid (prismasate) 200 mL/hr at 06/08/11 1704  . dialysate (PRISMASATE) 2,000 mL/hr at 06/10/11 0400  . vasopressin (PITRESSIN) infusion - *FOR SHOCK* 0.03 Units/min (06/09/11 0730)  . DISCONTD: dextrose    . DISCONTD: midazolam (VERSED) infusion 3 mg/hr (06/09/11 0445)  . DISCONTD: phenylephrine (NEO-SYNEPHRINE) Adult infusion Stopped (06/08/11 1957)    Scheduled Medications:    . amiodarone  150 mg Intravenous Once  . antiseptic oral rinse  15 mL Mouth Rinse QID  . calcitRIOL  1 mcg Oral Q M,W,F  . ceFEPime (MAXIPIME) IV  2 g Intravenous Q12H  . chlorhexidine  15 mL Mouth Rinse BID  . collagenase  Topical Daily  . feeding supplement (OXEPA)  1,000 mL Per Tube Q24H  . feeding supplement  30 mL Oral BID WC  . heparin subcutaneous  5,000 Units Subcutaneous Q8H  . hydrocortisone sod succinate (SOLU-CORTEF) injection  50 mg Intravenous Q6H  . insulin aspart  0-4 Units Subcutaneous Q4H  . micafungin (MYCAMINE) IV  100 mg Intravenous Daily  . midazolam      . midazolam      . mulitivitamin  5 mL Per Tube Daily  . pantoprazole sodium  40 mg Per Tube Q1200  . sirolimus  0.75 mg Oral Daily  . DISCONTD: sodium chloride  500 mL Intravenous Once    have reviewed scheduled and prn medications.  Physical Exam: General: critically ill on vent.  Warmer blanket, sedated.  Did open eyes to stimuli Heart: tachy Lungs: CBS bilaterally Abdomen: slightly distended Extremities: chronic lymphedema but also pitting dependant edema Dialysis Access: R IJ cath placed 5/12   I Assessment/ Plan: Pt is a 56 y.o. yo male s/p renal transplant who was admitted on 06/03/2011 with PNA  pulm HTN, volume overload now with acute on chronic renal failure requiring institution of  CRRT Assessment/Plan: 1. PNA/cardiomyopathy/hypotension- per CCM and cards.  Requiring significant vent and hemodynamic support.  Also cefepime, micafungin 2. S/p renal transplant with CAN- now ARF-  Support with CRRT.  Labs are stable, had some disruption in treatment yesterday. continue same dialysate and replacement fluids.  Have added low dose heparin and is tolerating well.  Regarding renal transplant, will continue his home dose of sirolimus and stress dose steriods.  Baseline function was not great with creatinine of 2.  Minimal UOP, will watch 3. Anemia- will support with aranesp if hgb gets below 11 4. Secondary hyperparathyroidism- no treatment right now 5. HTN/volume very overloaded appearing.  Even though CVP is 10 and on pressors and tachycardic.  Attempting 50 cc vol removal, afraid all that he will tolerate.  6.  Critically ill- prognosis seems poor.   Kalel Harty A   06/10/2011,7:35 AM  LOS: 7 days

## 2011-06-10 NOTE — Progress Notes (Signed)
INFECTIOUS DISEASE PROGRESS NOTE  ID: Chad [name] is a 56 y.o. male with A history of renal transplant on immunosuppressive drugs who presented with respiratory failure/sepsis, and intubated. Has been on broad spectrum antibiotics. + parainfluenza virus in BAL.    Subjective: Remains on pressors  Abtx:  Anti-infectives     Start     Dose/Rate Route Frequency Ordered Stop   06/08/11 2200   ceFEPIme (MAXIPIME) 2 g in dextrose 5 % 50 mL IVPB     Comments: Use Cefepime 1 g IV q12h for CrCl< 60 mL/min      2 g 100 mL/hr over 30 Minutes Intravenous Every 12 hours 06/08/11 1536 06/12/11 0959   06/08/11 0800   piperacillin-tazobactam (ZOSYN) IVPB 3.375 g  Status:  Discontinued        3.375 g 12.5 mL/hr over 240 Minutes Intravenous Every 8 hours 06/08/11 0149 06/08/11 1155   06/08/11 0600   micafungin (MYCAMINE) 100 mg in sodium chloride 0.9 % 100 mL IVPB  Status:  Discontinued        100 mg 100 mL/hr over 1 Hours Intravenous Daily 06/08/11 0308 06/10/11 1703   06/08/11 0200  piperacillin-tazobactam (ZOSYN) IVPB 3.375 g       3.375 g 100 mL/hr over 30 Minutes Intravenous  Once 06/08/11 0149 06/08/11 0254   06/06/11 0600   vancomycin (VANCOCIN) 1,750 mg in sodium chloride 0.9 % 500 mL IVPB  Status:  Discontinued        1,750 mg 250 mL/hr over 120 Minutes Intravenous Every 24 hours 06/05/11 0906 06/08/11 0403   06/05/11 2200   levofloxacin (LEVAQUIN) IVPB 750 mg  Status:  Discontinued     Comments: Use Levaquin 750 mg IV q48h for CrCl < 70mL/min      750 mg 100 mL/hr over 90 Minutes Intravenous Every 48 hours 06/04/11 0344 06/05/11 0908   06/05/11 1000   levofloxacin (LEVAQUIN) IVPB 750 mg  Status:  Discontinued     Comments: Use Levaquin 750 mg IV q48h for CrCl < 11mL/min      750 mg 100 mL/hr over 90 Minutes Intravenous Every 24 hours 06/05/11 0908 06/06/11 0908   06/04/11 1200   sulfamethoxazole-trimethoprim (BACTRIM) 664 mg in dextrose 5 % 500 mL IVPB  Status:  Discontinued          20 mg/kg/day  132.7 kg 361 mL/hr over 90 Minutes Intravenous 4 times per day 06/04/11 1039 06/06/11 0908   06/04/11 1100   micafungin (MYCAMINE) 100 mg in sodium chloride 0.9 % 100 mL IVPB  Status:  Discontinued        100 mg 100 mL/hr over 1 Hours Intravenous Daily 06/04/11 1030 06/06/11 0908   06/04/11 0600   ceFEPIme (MAXIPIME) 1 g in dextrose 5 % 50 mL IVPB  Status:  Discontinued     Comments: Use Cefepime 1 g IV q12h for CrCl< 60 mL/min      1 g 100 mL/hr over 30 Minutes Intravenous Every 12 hours 06/04/11 0344 06/08/11 1536   06/04/11 0600   vancomycin (VANCOCIN) 1,500 mg in sodium chloride 0.9 % 500 mL IVPB  Status:  Discontinued        1,500 mg 250 mL/hr over 120 Minutes Intravenous Every 24 hours 06/04/11 0425 06/05/11 0906   06/04/11 0430   levofloxacin (LEVAQUIN) IVPB 750 mg        750 mg 100 mL/hr over 90 Minutes Intravenous  Once 06/04/11 0354 06/04/11 4010   06/03/11 2330  vancomycin (VANCOCIN) IVPB 1000 mg/200 mL premix        1,000 mg 200 mL/hr over 60 Minutes Intravenous  Once 06/03/11 2326 06/04/11 0051   06/03/11 2330  piperacillin-tazobactam (ZOSYN) IVPB 3.375 g       3.375 g 12.5 mL/hr over 240 Minutes Intravenous  Once 06/03/11 2326 06/04/11 0521          Medications: I have reviewed the patient's current medications.  Objective: Vital signs in last 24 hours: Temp:  [96.3 F (35.7 C)-98.3 F (36.8 C)] 98.3 F (36.8 C) (05/14 1600) Pulse Rate:  [42-145] 137  (05/14 1600) Resp:  [13-24] 19  (05/14 1600) BP: (79-134)/(30-91) 134/83 mmHg (05/14 1600) SpO2:  [95 %-100 %] 99 % (05/14 1600) Arterial Line BP: (57-114)/(42-86) 112/66 mmHg (05/13 2230) FiO2 (%):  [70 %-90 %] 70 % (05/14 1525) Weight:  [324 lb 8.3 oz (147.2 kg)] 324 lb 8.3 oz (147.2 kg) (05/14 0600)   General appearance: unresponsive Resp: clear to auscultation bilaterally Cardio: tachy, no murmur  Lab Results  Basename 06/10/11 1459 06/10/11 0500 06/09/11 0441  WBC -- 29.7*  20.5*  HGB -- 11.1* 11.1*  HCT -- 32.1* 33.3*  NA 134* 134* --  K 4.8 4.7 --  CL 98 94* --  CO2 24 23 --  BUN 76* 94* --  CREATININE 2.72* 3.26* --  GLU -- -- --   Liver Panel  Basename 06/10/11 1459 06/10/11 0500 06/08/11 0230  PROT -- -- 6.6  ALBUMIN 2.6* 2.6* --  AST -- -- 85*  ALT -- -- 37  ALKPHOS -- -- 54  BILITOT -- -- 0.7  BILIDIR -- -- --  IBILI -- -- --   Sedimentation Rate No results found for this basename: ESRSEDRATE in the last 72 hours C-Reactive Protein No results found for this basename: CRP:2 in the last 72 hours  Microbiology: Recent Results (from the past 240 hour(s))  URINE CULTURE     Status: Normal   Collection Time   06/03/11 10:53 PM      Component Value Range Status Comment   Specimen Description URINE, RANDOM   Final    Special Requests ADDED [id number]   Final    Culture  Setup Time [id number]   Final    Colony Count >=100,000 COLONIES/ML   Final    Culture     Final    Value: Multiple bacterial morphotypes present, none predominant. Suggest appropriate recollection if clinically indicated.   Report Status 06/05/2011 FINAL   Final   CULTURE, BLOOD (ROUTINE X 2)     Status: Normal   Collection Time   06/03/11 11:45 PM      Component Value Range Status Comment   Specimen Description BLOOD RIGHT ARM   Final    Special Requests BOTTLES DRAWN AEROBIC AND ANAEROBIC 10CC   Final    Culture  Setup Time [id number]   Final    Culture NO GROWTH 5 DAYS   Final    Report Status 06/10/2011 FINAL   Final   CULTURE, BLOOD (ROUTINE X 2)     Status: Normal   Collection Time   06/03/11 11:50 PM      Component Value Range Status Comment   Specimen Description BLOOD RIGHT ARM   Final    Special Requests     Final    Value: BOTTLES DRAWN AEROBIC AND ANAEROBIC 5CC AEROBOC 3CC ANAEROBIC   Culture  Setup Time [id number]   Final    Culture NO  GROWTH 5 DAYS   Final    Report Status 06/10/2011 FINAL   Final   MRSA PCR SCREENING     Status: Normal    Collection Time   06/04/11  3:08 AM      Component Value Range Status Comment   MRSA by PCR NEGATIVE  NEGATIVE  Final   RESPIRATORY VIRUS PANEL (18 COMPONENTS)     Status: Abnormal   Collection Time   06/04/11  6:09 AM      Component Value Range Status Comment   Source - RVPAN NASAL MUCOSA   Final    Respiratory Syncytial Virus A NOT DETECTED   Final    Respiratory Syncytial Virus B NOT DETECTED   Final    Influenza A NOT DETECTED   Final    Influenza B NOT DETECTED   Final    Parainfluenza 1 NOT DETECTED   Final    Parainfluenza 2 NOT DETECTED   Final    Parainfluenza 3 DETECTED (*)  Final    Parainfluenza 4 NOT DETECTED   Final    Metapneumovirus NOT DETECTED   Final    [place]ox[place] and Echovirus NOT DETECTED   Final    Rhinovirus NOT DETECTED   Final    Adenovirus B NOT DETECTED   Final    Adenovirus E NOT DETECTED   Final    CoronavirusNL63 NOT DETECTED   Final    CoronavirusHKU1 NOT DETECTED   Final    Coronavirus229E NOT DETECTED   Final    CoronavirusOC43 NOT DETECTED   Final   CULTURE, EXPECTORATED SPUTUM-ASSESSMENT     Status: Normal   Collection Time   06/04/11  7:48 AM      Component Value Range Status Comment   Specimen Description SPUTUM   Final    Special Requests NONE   Final    Sputum evaluation     Final    Value: MICROSCOPIC FINDINGS SUGGEST THAT THIS SPECIMEN IS NOT REPRESENTATIVE OF LOWER RESPIRATORY SECRETIONS. PLEASE RECOLLECT.     CALLED TO D ORTIZ,RN 06/04/11 0911 BY K SCHULTZ   Report Status 06/04/2011 FINAL   Final   AFB CULTURE WITH SMEAR     Status: Normal (Preliminary result)   Collection Time   06/04/11 10:30 AM      Component Value Range Status Comment   Specimen Description BRONCHIAL WASHINGS   Final    Special Requests NONE   Final    ACID FAST SMEAR NO ACID FAST BACILLI SEEN   Final    Culture     Final    Value: CULTURE WILL BE EXAMINED FOR 6 WEEKS BEFORE ISSUING A FINAL REPORT   Report Status PENDING   Incomplete   PNEUMOCYSTIS JIROVECI SMEAR BY  DFA     Status: Normal   Collection Time   06/04/11 10:30 AM      Component Value Range Status Comment   Specimen Source-PJSRC BRONCHIAL WASHINGS   Final    Pneumocystis jiroveci Ag NEGATIVE   Final Performed at [facility]  FUNGUS CULTURE W SMEAR     Status: Normal (Preliminary result)   Collection Time   06/04/11 10:30 AM      Component Value Range Status Comment   Specimen Description BRONCHIAL WASHINGS   Final    Special Requests NONE   Final    Fungal Smear NO YEAST OR FUNGAL ELEMENTS SEEN   Final    Culture CULTURE IN PROGRESS FOR FOUR WEEKS  Final    Report Status PENDING   Incomplete   CULTURE, RESPIRATORY     Status: Normal   Collection Time   06/04/11 10:30 AM      Component Value Range Status Comment   Specimen Description BRONCHIAL WASHINGS   Final    Special Requests NONE   Final    Gram Stain     Final    Value: FEW WBC PRESENT,BOTH PMN AND MONONUCLEAR     NO SQUAMOUS EPITHELIAL CELLS SEEN     NO ORGANISMS SEEN   Culture NO GROWTH 2 DAYS   Final    Report Status 06/07/2011 FINAL   Final   HSV PCR     Status: Normal   Collection Time   06/04/11 10:30 AM      Component Value Range Status Comment   HSV, PCR Not Detected  Not Detected  Final    HSV 2 , PCR Not Detected  Not Detected  Final    Specimen Source-HSVPCR NO GROWTH   Final   CULTURE, BLOOD (ROUTINE X 2)     Status: Normal (Preliminary result)   Collection Time   06/04/11  3:15 PM      Component Value Range Status Comment   Specimen Description BLOOD RIGHT HAND   Final    Special Requests BOTTLES DRAWN AEROBIC AND ANAEROBIC 10CC   Final    Culture  Setup Time [id number]   Final    Culture     Final    Value:        BLOOD CULTURE RECEIVED NO GROWTH TO DATE CULTURE WILL BE HELD FOR 5 DAYS BEFORE ISSUING A FINAL NEGATIVE REPORT   Report Status PENDING   Incomplete   CULTURE, BLOOD (ROUTINE X 2)     Status: Normal (Preliminary result)   Collection Time   06/04/11  3:27 PM      Component Value Range  Status Comment   Specimen Description BLOOD LEFT HAND   Final    Special Requests BOTTLES DRAWN AEROBIC ONLY 3CC   Final    Culture  Setup Time [id number]   Final    Culture     Final    Value:        BLOOD CULTURE RECEIVED NO GROWTH TO DATE CULTURE WILL BE HELD FOR 5 DAYS BEFORE ISSUING A FINAL NEGATIVE REPORT   Report Status PENDING   Incomplete   CULTURE, BLOOD (ROUTINE X 2)     Status: Normal (Preliminary result)   Collection Time   06/08/11  4:15 AM      Component Value Range Status Comment   Specimen Description BLOOD LEFT HAND   Final    Special Requests BOTTLES DRAWN AEROBIC ONLY 10CC   Final    Culture  Setup Time [id number]   Final    Culture     Final    Value:        BLOOD CULTURE RECEIVED NO GROWTH TO DATE CULTURE WILL BE HELD FOR 5 DAYS BEFORE ISSUING A FINAL NEGATIVE REPORT   Report Status PENDING   Incomplete   CULTURE, BLOOD (ROUTINE X 2)     Status: Normal (Preliminary result)   Collection Time   06/08/11  4:20 AM      Component Value Range Status Comment   Specimen Description BLOOD LEFT HAND   Final    Special Requests BOTTLES DRAWN AEROBIC ONLY 10CC   Final    Culture  Setup Time [id number]   Final  Culture     Final    Value:        BLOOD CULTURE RECEIVED NO GROWTH TO DATE CULTURE WILL BE HELD FOR 5 DAYS BEFORE ISSUING A FINAL NEGATIVE REPORT   Report Status PENDING   Incomplete     Studies/Results: Dg Chest Port 1 View  06/10/2011  *RADIOLOGY REPORT*  Clinical Data: Respiratory failure.  Short of breath.  PORTABLE CHEST - 1 VIEW  Comparison: 06/09/2011.  Findings: Cardiomegaly.  Patient rotated to the left.  Endotracheal tube and enteric tube appear unchanged.  The right IJ vascular sheath appears remain present.  Pleural effusions, basilar atelectasis and mild pulmonary edema persist.  Aeration is unchanged compared to prior allowing for technique.  The left IJ central line tip is difficult to visualize due to overlying leads. Study is technically  suboptimal, under penetrated due to the airspace disease.  IMPRESSION: No interval change.  Stable support apparatus.  Original Report Authenticated By: Andreas Newport, M.D.   Dg Chest Port 1 View  06/09/2011  *RADIOLOGY REPORT*  Clinical Data: H D cath placement  PORTABLE CHEST - 1 VIEW  Comparison: 06/09/2011 at 0527 hours  Findings: Endotracheal tube with tip about 3.5 cm above the carina. Enteric tube tip is not visualized but appears to be below the left hemidiaphragm, likely in the stomach.  Right-sided central venous catheter placed.  Tip is difficult to visualize but appears to be in the low SVC region.  Left central venous catheter placed with tip in the low SVC region.  No pneumothorax.  Persistent infiltration in the right lung base with right pleural effusion. Cardiac enlargement.  IMPRESSION: Appliances appear to be in satisfactory location.  No pneumothorax. Persistent right pleural effusion with basilar atelectasis or infiltration.  Original Report Authenticated By: Marlon Pel, M.D.   Dg Chest Port 1 View  06/09/2011  *RADIOLOGY REPORT*  Clinical Data: Pneumonia.  PORTABLE CHEST - 1 VIEW  Comparison: 06/08/2011  Findings: Cardiomegaly. Bilateral airspace disease and effusions, slightly worsened since prior study.  Support devices are in stable position.  IMPRESSION: Mild worsening bilateral airspace disease and effusions, edema versus infection.  Original Report Authenticated By: Cyndie Chime, M.D.   Dg Abd Portable 1v  06/09/2011  *RADIOLOGY REPORT*  Clinical Data: Large residuals.  Evaluate for ileus or obstruction.  PORTABLE ABDOMEN - 1 VIEW  Comparison: 04/07/2009  Findings: There is a nonobstructive bowel gas pattern.  No evidence for ileus.  Decreasing gaseous distention since prior study.  No supine evidence of free air.  No acute bony abnormality.  IMPRESSION: No evidence of obstruction or ileus.  Original Report Authenticated By: Cyndie Chime, M.D.      Assessment/Plan: Pneumonia with sepsis - Fungal antibodies negative, remains on pressors.  Will d/c micafungin.  Last vancomycin dose 5/10, not indicated to continue at this time.    Eulas Schweitzer Infectious Diseases 06/10/2011, 5:04 PM

## 2011-06-10 NOTE — Progress Notes (Signed)
Pt continues to requires extensive medical care, not able to complete Advanced Directives.  CSW to sign off at this time, please re consult if needed.   Angelia Mould, MSW, [employer] [phone]

## 2011-06-10 NOTE — Progress Notes (Signed)
ANTIBIOTIC CONSULT NOTE - FOLLOW UP  Pharmacy Consult for Vanco/Cefepime Indication: Septic shock, PNA  No Known Allergies  Patient Measurements: Height: 5\' 11"  (180.3 cm) Weight: 324 lb 8.3 oz (147.2 kg) IBW/kg (Calculated) : 75.3  Adjusted Body Weight:    Vital Signs: Temp: 98.2 F (36.8 C) (05/14 0847) Temp src: Oral (05/14 0847) BP: 94/42 mmHg (05/14 0800) Pulse Rate: 133  (05/14 0800) Intake/Output from previous day: 05/13 0701 - 05/14 0700 In: 2639.9 [I.V.:2224.9; NG/GT:265; IV Piggyback:150] Out: 4453 [Urine:15; Emesis/NG output:1275; Stool:100] Intake/Output from this shift: Total I/O In: 90.1 [I.V.:90.1] Out: 277 [Emesis/NG output:100; Other:177]  Labs:  Basename 06/10/11 0500 06/09/11 1535 06/09/11 0441 06/08/11 0230  WBC 29.7* -- 20.5* 23.9*  HGB 11.1* -- 11.1* 12.6*  PLT 121* -- 110* 119*  LABCREA -- -- -- --  CREATININE 3.26* 3.33* 3.78* --   Estimated Creatinine Clearance: 38.1 ml/min (by C-G formula based on Cr of 3.26).  Basename 06/10/11 0500 06/09/11 0441 06/08/11 0230  VANCOTROUGH -- -- 52.0*  VANCOPEAK -- -- --  VANCORANDOM 32.2 38.1 --  GENTTROUGH -- -- --  GENTPEAK -- -- --  GENTRANDOM -- -- --  TOBRATROUGH -- -- --  TOBRAPEAK -- -- --  TOBRARND -- -- --  AMIKACINPEAK -- -- --  AMIKACINTROU -- -- --  AMIKACIN -- -- --     Microbiology: Recent Results (from the past 720 hour(s))  URINE CULTURE     Status: Normal   Collection Time   06/03/11 10:53 PM      Component Value Range Status Comment   Specimen Description URINE, RANDOM   Final    Special Requests ADDED [id number]   Final    Culture  Setup Time [id number]   Final    Colony Count >=100,000 COLONIES/ML   Final    Culture     Final    Value: Multiple bacterial morphotypes present, none predominant. Suggest appropriate recollection if clinically indicated.   Report Status 06/05/2011 FINAL   Final   CULTURE, BLOOD (ROUTINE X 2)     Status: Normal   Collection Time   06/03/11  11:45 PM      Component Value Range Status Comment   Specimen Description BLOOD RIGHT ARM   Final    Special Requests BOTTLES DRAWN AEROBIC AND ANAEROBIC 10CC   Final    Culture  Setup Time [id number]   Final    Culture NO GROWTH 5 DAYS   Final    Report Status 06/10/2011 FINAL   Final   CULTURE, BLOOD (ROUTINE X 2)     Status: Normal   Collection Time   06/03/11 11:50 PM      Component Value Range Status Comment   Specimen Description BLOOD RIGHT ARM   Final    Special Requests     Final    Value: BOTTLES DRAWN AEROBIC AND ANAEROBIC 5CC AEROBOC 3CC ANAEROBIC   Culture  Setup Time [id number]   Final    Culture NO GROWTH 5 DAYS   Final    Report Status 06/10/2011 FINAL   Final   MRSA PCR SCREENING     Status: Normal   Collection Time   06/04/11  3:08 AM      Component Value Range Status Comment   MRSA by PCR NEGATIVE  NEGATIVE  Final   RESPIRATORY VIRUS PANEL (18 COMPONENTS)     Status: Abnormal   Collection Time   06/04/11  6:09 AM      Component  Value Range Status Comment   Source - RVPAN NASAL MUCOSA   Final    Respiratory Syncytial Virus A NOT DETECTED   Final    Respiratory Syncytial Virus B NOT DETECTED   Final    Influenza A NOT DETECTED   Final    Influenza B NOT DETECTED   Final    Parainfluenza 1 NOT DETECTED   Final    Parainfluenza 2 NOT DETECTED   Final    Parainfluenza 3 DETECTED (*)  Final    Parainfluenza 4 NOT DETECTED   Final    Metapneumovirus NOT DETECTED   Final    [place] and Echovirus NOT DETECTED   Final    Rhinovirus NOT DETECTED   Final    Adenovirus B NOT DETECTED   Final    Adenovirus E NOT DETECTED   Final    CoronavirusNL63 NOT DETECTED   Final    CoronavirusHKU1 NOT DETECTED   Final    Coronavirus229E NOT DETECTED   Final    CoronavirusOC43 NOT DETECTED   Final   CULTURE, EXPECTORATED SPUTUM-ASSESSMENT     Status: Normal   Collection Time   06/04/11  7:48 AM      Component Value Range Status Comment   Specimen Description SPUTUM   Final     Special Requests NONE   Final    Sputum evaluation     Final    Value: MICROSCOPIC FINDINGS SUGGEST THAT THIS SPECIMEN IS NOT REPRESENTATIVE OF LOWER RESPIRATORY SECRETIONS. PLEASE RECOLLECT.     CALLED TO D ORTIZ,RN 06/04/11 0911 BY K SCHULTZ   Report Status 06/04/2011 FINAL   Final   AFB CULTURE WITH SMEAR     Status: Normal (Preliminary result)   Collection Time   06/04/11 10:30 AM      Component Value Range Status Comment   Specimen Description BRONCHIAL WASHINGS   Final    Special Requests NONE   Final    ACID FAST SMEAR NO ACID FAST BACILLI SEEN   Final    Culture     Final    Value: CULTURE WILL BE EXAMINED FOR 6 WEEKS BEFORE ISSUING A FINAL REPORT   Report Status PENDING   Incomplete   PNEUMOCYSTIS JIROVECI SMEAR BY DFA     Status: Normal   Collection Time   06/04/11 10:30 AM      Component Value Range Status Comment   Specimen Source-PJSRC BRONCHIAL WASHINGS   Final    Pneumocystis jiroveci Ag NEGATIVE   Final Performed at [facility]  FUNGUS CULTURE W SMEAR     Status: Normal (Preliminary result)   Collection Time   06/04/11 10:30 AM      Component Value Range Status Comment   Specimen Description BRONCHIAL WASHINGS   Final    Special Requests NONE   Final    Fungal Smear NO YEAST OR FUNGAL ELEMENTS SEEN   Final    Culture CULTURE IN PROGRESS FOR FOUR WEEKS   Final    Report Status PENDING   Incomplete   CULTURE, RESPIRATORY     Status: Normal   Collection Time   06/04/11 10:30 AM      Component Value Range Status Comment   Specimen Description BRONCHIAL WASHINGS   Final    Special Requests NONE   Final    Gram Stain     Final    Value: FEW WBC PRESENT,BOTH PMN AND MONONUCLEAR     NO SQUAMOUS EPITHELIAL CELLS  SEEN     NO ORGANISMS SEEN   Culture NO GROWTH 2 DAYS   Final    Report Status 06/07/2011 FINAL   Final   HSV PCR     Status: Normal   Collection Time   06/04/11 10:30 AM      Component Value Range Status Comment   HSV, PCR Not Detected  Not Detected   Final    HSV 2 , PCR Not Detected  Not Detected  Final    Specimen Source-HSVPCR NO GROWTH   Final   CULTURE, BLOOD (ROUTINE X 2)     Status: Normal (Preliminary result)   Collection Time   06/04/11  3:15 PM      Component Value Range Status Comment   Specimen Description BLOOD RIGHT HAND   Final    Special Requests BOTTLES DRAWN AEROBIC AND ANAEROBIC 10CC   Final    Culture  Setup Time [id number]   Final    Culture     Final    Value:        BLOOD CULTURE RECEIVED NO GROWTH TO DATE CULTURE WILL BE HELD FOR 5 DAYS BEFORE ISSUING A FINAL NEGATIVE REPORT   Report Status PENDING   Incomplete   CULTURE, BLOOD (ROUTINE X 2)     Status: Normal (Preliminary result)   Collection Time   06/04/11  3:27 PM      Component Value Range Status Comment   Specimen Description BLOOD LEFT HAND   Final    Special Requests BOTTLES DRAWN AEROBIC ONLY 3CC   Final    Culture  Setup Time [id number]   Final    Culture     Final    Value:        BLOOD CULTURE RECEIVED NO GROWTH TO DATE CULTURE WILL BE HELD FOR 5 DAYS BEFORE ISSUING A FINAL NEGATIVE REPORT   Report Status PENDING   Incomplete   CULTURE, BLOOD (ROUTINE X 2)     Status: Normal (Preliminary result)   Collection Time   06/08/11  4:15 AM      Component Value Range Status Comment   Specimen Description BLOOD LEFT HAND   Final    Special Requests BOTTLES DRAWN AEROBIC ONLY 10CC   Final    Culture  Setup Time [id number]   Final    Culture     Final    Value:        BLOOD CULTURE RECEIVED NO GROWTH TO DATE CULTURE WILL BE HELD FOR 5 DAYS BEFORE ISSUING A FINAL NEGATIVE REPORT   Report Status PENDING   Incomplete   CULTURE, BLOOD (ROUTINE X 2)     Status: Normal (Preliminary result)   Collection Time   06/08/11  4:20 AM      Component Value Range Status Comment   Specimen Description BLOOD LEFT HAND   Final    Special Requests BOTTLES DRAWN AEROBIC ONLY 10CC   Final    Culture  Setup Time [id number]   Final    Culture     Final    Value:         BLOOD CULTURE RECEIVED NO GROWTH TO DATE CULTURE WILL BE HELD FOR 5 DAYS BEFORE ISSUING A FINAL NEGATIVE REPORT   Report Status PENDING   Incomplete     Anti-infectives     Start     Dose/Rate Route Frequency Ordered Stop   06/08/11 2200   ceFEPIme (MAXIPIME) 2 g in dextrose 5 % 50 mL IVPB  Comments: Use Cefepime 1 g IV q12h for CrCl< 60 mL/min      2 g 100 mL/hr over 30 Minutes Intravenous Every 12 hours 06/08/11 1536 06/12/11 0959   06/08/11 0800   piperacillin-tazobactam (ZOSYN) IVPB 3.375 g  Status:  Discontinued        3.375 g 12.5 mL/hr over 240 Minutes Intravenous Every 8 hours 06/08/11 0149 06/08/11 1155   06/08/11 0600   micafungin (MYCAMINE) 100 mg in sodium chloride 0.9 % 100 mL IVPB        100 mg 100 mL/hr over 1 Hours Intravenous Daily 06/08/11 0308     06/08/11 0200   piperacillin-tazobactam (ZOSYN) IVPB 3.375 g        3.375 g 100 mL/hr over 30 Minutes Intravenous  Once 06/08/11 0149 06/08/11 0254   06/06/11 0600   vancomycin (VANCOCIN) 1,750 mg in sodium chloride 0.9 % 500 mL IVPB  Status:  Discontinued        1,750 mg 250 mL/hr over 120 Minutes Intravenous Every 24 hours 06/05/11 0906 06/08/11 0403   06/05/11 2200   levofloxacin (LEVAQUIN) IVPB 750 mg  Status:  Discontinued     Comments: Use Levaquin 750 mg IV q48h for CrCl < 36mL/min      750 mg 100 mL/hr over 90 Minutes Intravenous Every 48 hours 06/04/11 0344 06/05/11 0908   06/05/11 1000   levofloxacin (LEVAQUIN) IVPB 750 mg  Status:  Discontinued     Comments: Use Levaquin 750 mg IV q48h for CrCl < 38mL/min      750 mg 100 mL/hr over 90 Minutes Intravenous Every 24 hours 06/05/11 0908 06/06/11 0908   06/04/11 1200   sulfamethoxazole-trimethoprim (BACTRIM) 664 mg in dextrose 5 % 500 mL IVPB  Status:  Discontinued        20 mg/kg/day  132.7 kg 361 mL/hr over 90 Minutes Intravenous 4 times per day 06/04/11 1039 06/06/11 0908   06/04/11 1100   micafungin (MYCAMINE) 100 mg in sodium chloride 0.9 % 100 mL  IVPB  Status:  Discontinued        100 mg 100 mL/hr over 1 Hours Intravenous Daily 06/04/11 1030 06/06/11 0908   06/04/11 0600   ceFEPIme (MAXIPIME) 1 g in dextrose 5 % 50 mL IVPB  Status:  Discontinued     Comments: Use Cefepime 1 g IV q12h for CrCl< 60 mL/min      1 g 100 mL/hr over 30 Minutes Intravenous Every 12 hours 06/04/11 0344 06/08/11 1536   06/04/11 0600   vancomycin (VANCOCIN) 1,500 mg in sodium chloride 0.9 % 500 mL IVPB  Status:  Discontinued        1,500 mg 250 mL/hr over 120 Minutes Intravenous Every 24 hours 06/04/11 0425 06/05/11 0906   06/04/11 0430   levofloxacin (LEVAQUIN) IVPB 750 mg        750 mg 100 mL/hr over 90 Minutes Intravenous  Once 06/04/11 0354 06/04/11 0632   06/03/11 2330   vancomycin (VANCOCIN) IVPB 1000 mg/200 mL premix        1,000 mg 200 mL/hr over 60 Minutes Intravenous  Once 06/03/11 2326 06/04/11 0051   06/03/11 2330   piperacillin-tazobactam (ZOSYN) IVPB 3.375 g        3.375 g 12.5 mL/hr over 240 Minutes Intravenous  Once 06/03/11 2326 06/04/11 0521          Assessment: Admit Complaint: SOB, cough, fever x 3 days presented to ED 5/7, has hx renal transplant (immunosupressed).  Infectious Disease: ?  HCAP with immunosuppresion, Temp 96.1 Monday now 98.2. WBC up to 29.7. Random Vanco level still elevated at 32.2 now on CVVHD. Recheck q am to possible resume q24h dosing. Meds: Cefepime 2g/12h #7, Micafungin 100mg  IV/daily #7, Vanco #7-HOLD for levels  Abx: 5/7 Vanc >> 5/7 Levaquin > 5/10 5/7 Mycamine >5/10 5/7 Bactrim > 5/10 5/8 Cefepime 5/8 >>5/16 5/11 Zosyn >> 5/12 **no need for duplicate antipseudomonals/beta-lactam  Micro: 5/7 Urine: insignificant 5/7 Blood: negative 5/8 BAL: neg (AFB, fungal smear, PCP) 5/8 HSV PCR: neg 5/8 Blood: ngtd 5/8 Resp virus panel: Parainfluenza 3 positive 5/12: BC x 2: pending  Cards:Cardiovascular: hx HTN/HLD. Septic and cardiogenic shock. EKG with Aflutter + RVR. Echo= EF 15-20% of unclear  etiology. Zocor d/c due to hx Hep C- LFTs WNL. BP 94/42 with HR 133 this am. Started Amiodarone IV Monday. Amiodarone drug interactions in note 5/13:  Meds: Milrinone 0.48mcg/kg/min dose ok for CVVHD, Norepi, Vasopres  Amio drug interactions:  Major Macrolide Immunosuppressives / Amiodarone and Derivatives Administration of Amiodarone and Derivatives is likely to cause significantly increased blood concentrations of Macrolide Immunosuppressives (Sirolimus). Clinicians are advised to prospectively lower Macrolide Immunosuppressives dose, and frequently monitor blood concentrations in order to minimize prolonged periods of supratherapeutic concentrations and related toxicities. In addition, the potential exists especially in elderly patients for the occurrence of prolongation of the QT interval.  Anticoagulation: sq heparin 5000 units/8h. FOB +. CBC stable.   Goal of Therapy:  Vanco level <=20 before redosing  Plan:  Cefepime 2g IV q12h Vanco- Hold for levels. Random level Wed. Am. Milrinone 0.65mcg/kg/hr F/u large gastric residuals for holding of TF F/u heme + stools for need to d/c sq heparin and only use SCDs?  Misty Stanley Stillinger 06/10/2011,9:05 AM

## 2011-06-10 NOTE — Progress Notes (Signed)
ASSName: Chad [name] MRN: [id number] DOB: 04/16/1955    LOS: 7  PCCM  NOTE  History of Present Illness: [name] s/p renal Tx, immune suppressed, adm 5/08 to [facility] service with 3 days of cough, shortness of breath and fever. Progressive resp failure resulting in intubation 5/8.   Lines / Drains: R IJ HD cath 5/12 >> 5/13 (inadvertent removal) Aline 5/8>>>out R IJ HD cath 5/13 >>  Left IJ 5/8>>> ETT 5/8>>>   Cultures: BAL bronch 5/8>>>ng    AFB >> neg   Viral panel >> POS parainfluenza virus   Pneumocystis >> neg   Fungus >> neg   HSV >> neg BC 5/7>>>neg  Blood X 2 5/12 >>   Antibiotics: 5/7 levoflox>>>5/10 5/7 bactrim >>>5/10 5/7 vanc >>  5/7 micafungin >>  5/10 cefepime >>  Tests / Events: 5/8 hypoxic resp failure, intubated, near arrest 5/8 FOB - diffuse airway edema, diffuse bloody mucoid secretions 5/11 worsening shock , on sepsis protocol + dobut for low co-ox 5/12 Echocardiogram:  LVEF 15-20%. Diffuse HK   Subj: RASS -2, not F/C. No WUA performed  Vital Signs: Temp:  [96.1 F (35.6 C)-98.2 F (36.8 C)] 98.1 F (36.7 C) (05/14 1205) Pulse Rate:  [42-145] 103  (05/14 1500) Resp:  [13-24] 14  (05/14 1500) BP: (79-127)/(30-91) 86/53 mmHg (05/14 1500) SpO2:  [95 %-100 %] 97 % (05/14 1500) Arterial Line BP: (57-114)/(42-86) 112/66 mmHg (05/13 2230) FiO2 (%):  [70 %-90 %] 70 % (05/14 1525) Weight:  [147.2 kg (324 lb 8.3 oz)] 147.2 kg (324 lb 8.3 oz) (05/14 0600) I/O last 3 completed shifts: In: 4107 [I.V.:3212; NG/GT:595; IV Piggyback:300] Out: 6383 [Urine:120; Emesis/NG output:1705; WUJWJ:1914; Stool:100]  Physical Examination: General: Synchronous, sedated, not F/C Neuro:  Nonfocal, sedated HEENT:  Line clean Neck:  jvd increased Cardiovascular:  ST S2 S2 rrr Lungs:  ronchi crackles Abdomen:  Soft, bs wnl no r/g Lower ext- severe chronic edema  Ventilator settings: Vent Mode:  [-] PRVC FiO2 (%):  [70 %-90 %] 70 % Set Rate:  [16 bmp] 16  bmp Vt Set:  [450 mL] 450 mL PEEP:  [12 cmH20-15 cmH20] 12 cmH20 Plateau Pressure:  [17 cmH20-24 cmH20] 22 cmH20  Labs and Imaging:    CBC    Component Value Date/Time   WBC 29.7* 06/10/2011 0500   RBC 3.74* 06/10/2011 0500   HGB 11.1* 06/10/2011 0500   HCT 32.1* 06/10/2011 0500   PLT 121* 06/10/2011 0500   MCV 85.8 06/10/2011 0500   MCH 29.7 06/10/2011 0500   MCHC 34.6 06/10/2011 0500   RDW 16.6* 06/10/2011 0500   LYMPHSABS 1.0 06/07/2011 2105   MONOABS 0.3 06/07/2011 2105   EOSABS 0.0 06/07/2011 2105   BASOSABS 0.0 06/07/2011 2105    BMET    Component Value Date/Time   NA 134* 06/10/2011 1459   K 4.8 06/10/2011 1459   CL 98 06/10/2011 1459   CO2 24 06/10/2011 1459   GLUCOSE 142* 06/10/2011 1459   BUN 76* 06/10/2011 1459   CREATININE 2.72* 06/10/2011 1459   CALCIUM 7.7* 06/10/2011 1459   GFRNONAA 25* 06/10/2011 1459   GFRAA 29* 06/10/2011 1459    CXR: LLL atx, RLL infiltrate +/- effusion  Assessment and Plan: 1. Pulmonary  - Acute respiratory failure  PNA, immunesuppressed Plan - - Cont ARDS protocol - Cont abx as per ID  2. CVS Cardiomyopathy of unclear etiology - suspect viral, Shock - cardiogenic and/or septic, PSVT/A fib/flutter - Amiodarone started per  cards.  - Wean pressors for MAP > 60 mmHg - Cont stress dose steroids  3. Renal Renal TX, ATN, worsening acidosis & mild hyperkalemia - Cont CVVH per Renal  4. ID PNA Immunocomprommsied host Empiric  HCAP   - abx Per ID  4. Heme Leukocytosis, low plat  - Monitor closely on SQ heparin  5. Endo - CBGs, SSI  6. MIld agitation - Cont sedation protocol - Daily WUA beginning 5/14  7. GI/ Nutrition - not tolerating gastric feedings. - Hold TFs - Panda per IR into post pyloric position  8. H/o Hep C - monitor LFTs periodically    Best practices / Disposition: -->ICU status under PCCM -->full code -->Heparin for DVT Px sub q -->Protonix for GI Px -->ventilator bundle -->TFs   Will meet with family  5/15.  40 mins CCM time    Billy Fischer, MD;  PCCM service; Mobile [phone]

## 2011-06-11 ENCOUNTER — Inpatient Hospital Stay (HOSPITAL_COMMUNITY): Payer: BC Managed Care – PPO

## 2011-06-11 LAB — RENAL FUNCTION PANEL
Albumin: 2.4 g/dL — ABNORMAL LOW (ref 3.5–5.2)
Albumin: 2.3 g/dL — ABNORMAL LOW (ref 3.5–5.2)
Albumin: 2.1 g/dL — ABNORMAL LOW (ref 3.5–5.2)
BUN: 61 mg/dL — ABNORMAL HIGH (ref 6–23)
BUN: 46 mg/dL — ABNORMAL HIGH (ref 6–23)
Calcium: 8.7 mg/dL (ref 8.4–10.5)
Chloride: 100 mEq/L (ref 96–112)
Creatinine, Ser: 2.14 mg/dL — ABNORMAL HIGH (ref 0.50–1.35)
Creatinine, Ser: 1.92 mg/dL — ABNORMAL HIGH (ref 0.50–1.35)
GFR calc Af Amer: 34 mL/min — ABNORMAL LOW (ref >90)
GFR calc non Af Amer: 33 mL/min — ABNORMAL LOW (ref >90)
Glucose, Bld: 144 mg/dL — ABNORMAL HIGH (ref 70–99)
Glucose, Bld: 145 mg/dL — ABNORMAL HIGH (ref 70–99)
Phosphorus: 4.5 mg/dL (ref 2.3–4.6)
Phosphorus: 3.7 mg/dL (ref 2.3–4.6)
Potassium: 4.6 mEq/L (ref 3.5–5.1)
Sodium: 136 mEq/L (ref 135–145)

## 2011-06-11 LAB — CBC
Hemoglobin: 9.6 g/dL — ABNORMAL LOW (ref 13.0–17.0)
Hemoglobin: 9.2 g/dL — ABNORMAL LOW (ref 13.0–17.0)
MCH: 29.7 pg (ref 26.0–34.0)
MCH: 29.4 pg (ref 26.0–34.0)
MCV: 89.1 fL (ref 78.0–100.0)
Platelets: 115 10*3/uL — ABNORMAL LOW (ref 150–400)
Platelets: 102 10*3/uL — ABNORMAL LOW (ref 150–400)
RBC: 3.23 MIL/uL — ABNORMAL LOW (ref 4.22–5.81)
RBC: 3.13 MIL/uL — ABNORMAL LOW (ref 4.22–5.81)
WBC: 35.2 10*3/uL — ABNORMAL HIGH (ref 4.0–10.5)
WBC: 32 10*3/uL — ABNORMAL HIGH (ref 4.0–10.5)

## 2011-06-11 LAB — POCT I-STAT EG7
Acid-Base Excess: 1 mmol/L (ref 0.0–2.0)
Acid-Base Excess: 2 mmol/L (ref 0.0–2.0)
Acid-Base Excess: 3 mmol/L — ABNORMAL HIGH (ref 0.0–2.0)
Bicarbonate: 28.6 mEq/L — ABNORMAL HIGH (ref 20.0–24.0)
Bicarbonate: 30.3 mEq/L — ABNORMAL HIGH (ref 20.0–24.0)
Bicarbonate: 31.8 mEq/L — ABNORMAL HIGH (ref 20.0–24.0)
Calcium, Ion: 0.62 mmol/L — CL (ref 1.12–1.32)
Calcium, Ion: 1.1 mmol/L — ABNORMAL LOW (ref 1.12–1.32)
Calcium, Ion: 1.12 mmol/L (ref 1.12–1.32)
HCT: 34 % — ABNORMAL LOW (ref 39.0–52.0)
HCT: 32 % — ABNORMAL LOW (ref 39.0–52.0)
HCT: 30 % — ABNORMAL LOW (ref 39.0–52.0)
HCT: 32 % — ABNORMAL LOW (ref 39.0–52.0)
Hemoglobin: 11.6 g/dL — ABNORMAL LOW (ref 13.0–17.0)
Hemoglobin: 10.9 g/dL — ABNORMAL LOW (ref 13.0–17.0)
Hemoglobin: 10.2 g/dL — ABNORMAL LOW (ref 13.0–17.0)
Hemoglobin: 10.9 g/dL — ABNORMAL LOW (ref 13.0–17.0)
O2 Saturation: 71 %
O2 Saturation: 73 %
Patient temperature: 97.6
Potassium: 4.3 mEq/L (ref 3.5–5.1)
Potassium: 4.3 mEq/L (ref 3.5–5.1)
Sodium: 137 mEq/L (ref 135–145)
Sodium: 136 mEq/L (ref 135–145)
Sodium: 134 mEq/L — ABNORMAL LOW (ref 135–145)
Sodium: 135 mEq/L (ref 135–145)
Sodium: 137 mEq/L (ref 135–145)
TCO2: 30 mmol/L (ref 0–100)
TCO2: 30 mmol/L (ref 0–100)
TCO2: 32 mmol/L (ref 0–100)
TCO2: 32 mmol/L (ref 0–100)
TCO2: 34 mmol/L (ref 0–100)
pCO2, Ven: 58.1 mmHg — ABNORMAL HIGH (ref 45.0–50.0)
pCO2, Ven: 55.4 mmHg — ABNORMAL HIGH (ref 45.0–50.0)
pCO2, Ven: 58.1 mmHg — ABNORMAL HIGH (ref 45.0–50.0)
pH, Ven: 7.347 — ABNORMAL HIGH (ref 7.250–7.300)
pO2, Ven: 40 mmHg (ref 30.0–45.0)
pO2, Ven: 41 mmHg (ref 30.0–45.0)
pO2, Ven: 39 mmHg (ref 30.0–45.0)
pO2, Ven: 40 mmHg (ref 30.0–45.0)

## 2011-06-11 LAB — MAGNESIUM: Magnesium: 2.8 mg/dL — ABNORMAL HIGH (ref 1.5–2.5)

## 2011-06-11 LAB — GLUCOSE, CAPILLARY: Glucose-Capillary: 140 mg/dL — ABNORMAL HIGH (ref 70–99)

## 2011-06-11 LAB — CULTURE, BLOOD (ROUTINE X 2)
Culture  Setup Time: 201305090004
Culture: NO GROWTH

## 2011-06-11 LAB — POCT ACTIVATED CLOTTING TIME
Activated Clotting Time: 193 seconds
Activated Clotting Time: 188 seconds
Activated Clotting Time: 193 seconds
Activated Clotting Time: 199 seconds

## 2011-06-11 MED ORDER — MIDAZOLAM HCL 2 MG/2ML IJ SOLN
INTRAMUSCULAR | Status: AC
  Administered 2011-06-11: 1 mg
  Filled 2011-06-11: qty 2

## 2011-06-11 MED ORDER — DEXTROSE 5 % IV SOLN
Status: DC
  Administered 2011-06-11 – 2011-06-16 (×14): via INTRAVENOUS_CENTRAL
  Filled 2011-06-11 (×36): qty 1500

## 2011-06-11 MED ORDER — IOHEXOL 300 MG/ML  SOLN
10.0000 mL | Freq: Once | INTRAMUSCULAR | Status: AC | PRN
  Administered 2011-06-11: 10 mL

## 2011-06-11 MED ORDER — MIDAZOLAM HCL 2 MG/2ML IJ SOLN
INTRAMUSCULAR | Status: AC
  Administered 2011-06-11: 2 mg
  Filled 2011-06-11: qty 2

## 2011-06-11 MED ORDER — DARBEPOETIN ALFA-POLYSORBATE 100 MCG/0.5ML IJ SOLN
100.0000 ug | INTRAMUSCULAR | Status: DC
  Administered 2011-06-11 – 2011-06-18 (×2): 100 ug via INTRAVENOUS
  Filled 2011-06-11 (×4): qty 0.5

## 2011-06-11 MED ORDER — RIBAVIRIN 200 MG PO CAPS: 400.0000 mg | ORAL_CAPSULE | Freq: Three times a day (TID) | ORAL | Status: DC

## 2011-06-11 MED ORDER — RIBAVIRIN 40 MG/ML PO SOLN
400.0000 mg | Freq: Three times a day (TID) | ORAL | Status: DC
  Administered 2011-06-12 – 2011-06-18 (×20): 400 mg
  Filled 2011-06-11 (×21): qty 10

## 2011-06-11 MED ORDER — PANTOPRAZOLE SODIUM 40 MG PO PACK
40.0000 mg | PACK | Freq: Two times a day (BID) | ORAL | Status: DC
  Administered 2011-06-12 – 2011-06-24 (×27): 40 mg
  Filled 2011-06-11 (×30): qty 20

## 2011-06-11 MED ORDER — IMMUNE GLOBULIN (HUMAN) 20 GM/200ML IV SOLN
0.5000 g/kg | INTRAVENOUS | Status: DC
  Administered 2011-06-11 – 2011-06-17 (×4): 70 g via INTRAVENOUS
  Filled 2011-06-11 (×7): qty 700

## 2011-06-11 MED ORDER — DEXTROSE 5 % IV SOLN
20.0000 g | INTRAVENOUS | Status: DC
  Administered 2011-06-11 – 2011-06-16 (×5): 20 g via INTRAVENOUS_CENTRAL
  Filled 2011-06-11 (×14): qty 200

## 2011-06-11 MED ORDER — PRO-STAT SUGAR FREE PO LIQD
60.0000 mL | Freq: Four times a day (QID) | ORAL | Status: DC
  Administered 2011-06-11 – 2011-06-23 (×45): 60 mL
  Filled 2011-06-11 (×53): qty 60

## 2011-06-11 MED ORDER — HYDROCORTISONE SOD SUCCINATE 100 MG IJ SOLR
50.0000 mg | Freq: Three times a day (TID) | INTRAMUSCULAR | Status: DC
  Administered 2011-06-12 – 2011-06-13 (×5): 50 mg via INTRAVENOUS
  Filled 2011-06-11 (×8): qty 1

## 2011-06-11 NOTE — Progress Notes (Addendum)
INFECTIOUS DISEASE PROGRESS NOTE  ID: Chad [name] is a 55 y.o. male with A history of renal transplant on immunosuppressive drugs who presented with respiratory failure/sepsis, and intubated. Has been on broad spectrum antibiotics. + parainfluenza virus in BAL. Patient remains critically ill and little progress.   Subjective: Remains on pressors  Abtx:  Anti-infectives     Start     Dose/Rate Route Frequency Ordered Stop   06/11/11 1600   ribavirin (REBETOL) capsule 400 mg        400 mg Oral 3 times daily 06/11/11 1239     06/08/11 2200   ceFEPIme (MAXIPIME) 2 g in dextrose 5 % 50 mL IVPB     Comments: Use Cefepime 1 g IV q12h for CrCl< 60 mL/min      2 g 100 mL/hr over 30 Minutes Intravenous Every 12 hours 06/08/11 1536 06/12/11 0959   06/08/11 0800   piperacillin-tazobactam (ZOSYN) IVPB 3.375 g  Status:  Discontinued        3.375 g 12.5 mL/hr over 240 Minutes Intravenous Every 8 hours 06/08/11 0149 06/08/11 1155   06/08/11 0600   micafungin (MYCAMINE) 100 mg in sodium chloride 0.9 % 100 mL IVPB  Status:  Discontinued        100 mg 100 mL/hr over 1 Hours Intravenous Daily 06/08/11 0308 06/10/11 1703   06/08/11 0200  piperacillin-tazobactam (ZOSYN) IVPB 3.375 g       3.375 g 100 mL/hr over 30 Minutes Intravenous  Once 06/08/11 0149 06/08/11 0254   06/06/11 0600   vancomycin (VANCOCIN) 1,750 mg in sodium chloride 0.9 % 500 mL IVPB  Status:  Discontinued        1,750 mg 250 mL/hr over 120 Minutes Intravenous Every 24 hours 06/05/11 0906 06/08/11 0403   06/05/11 2200   levofloxacin (LEVAQUIN) IVPB 750 mg  Status:  Discontinued     Comments: Use Levaquin 750 mg IV q48h for CrCl < 57mL/min      750 mg 100 mL/hr over 90 Minutes Intravenous Every 48 hours 06/04/11 0344 06/05/11 0908   06/05/11 1000   levofloxacin (LEVAQUIN) IVPB 750 mg  Status:  Discontinued     Comments: Use Levaquin 750 mg IV q48h for CrCl < 27mL/min      750 mg 100 mL/hr over 90 Minutes Intravenous Every  24 hours 06/05/11 0908 06/06/11 0908   06/04/11 1200   sulfamethoxazole-trimethoprim (BACTRIM) 664 mg in dextrose 5 % 500 mL IVPB  Status:  Discontinued        20 mg/kg/day  132.7 kg 361 mL/hr over 90 Minutes Intravenous 4 times per day 06/04/11 1039 06/06/11 0908   06/04/11 1100   micafungin (MYCAMINE) 100 mg in sodium chloride 0.9 % 100 mL IVPB  Status:  Discontinued        100 mg 100 mL/hr over 1 Hours Intravenous Daily 06/04/11 1030 06/06/11 0908   06/04/11 0600   ceFEPIme (MAXIPIME) 1 g in dextrose 5 % 50 mL IVPB  Status:  Discontinued     Comments: Use Cefepime 1 g IV q12h for CrCl< 60 mL/min      1 g 100 mL/hr over 30 Minutes Intravenous Every 12 hours 06/04/11 0344 06/08/11 1536   06/04/11 0600   vancomycin (VANCOCIN) 1,500 mg in sodium chloride 0.9 % 500 mL IVPB  Status:  Discontinued        1,500 mg 250 mL/hr over 120 Minutes Intravenous Every 24 hours 06/04/11 0425 06/05/11 0906   06/04/11 0430  levofloxacin (LEVAQUIN) IVPB 750 mg        750 mg 100 mL/hr over 90 Minutes Intravenous  Once 06/04/11 0354 06/04/11 0632   06/03/11 2330   vancomycin (VANCOCIN) IVPB 1000 mg/200 mL premix        1,000 mg 200 mL/hr over 60 Minutes Intravenous  Once 06/03/11 2326 06/04/11 0051   06/03/11 2330  piperacillin-tazobactam (ZOSYN) IVPB 3.375 g       3.375 g 12.5 mL/hr over 240 Minutes Intravenous  Once 06/03/11 2326 06/04/11 0521          Medications: I have reviewed the patient's current medications.  Objective: Vital signs in last 24 hours: Temp:  [97.6 F (36.4 C)-98.5 F (36.9 C)] 97.6 F (36.4 C) (05/15 1139) Pulse Rate:  [103-137] 107  (05/15 1100) Resp:  [14-23] 15  (05/15 1100) BP: (68-134)/(41-83) 97/50 mmHg (05/15 1100) SpO2:  [96 %-100 %] 99 % (05/15 1100) FiO2 (%):  [50 %-70 %] 50 % (05/15 1205) Weight:  [309 lb 11.9 oz (140.5 kg)] 309 lb 11.9 oz (140.5 kg) (05/15 0500)   General appearance: unresponsive Resp: clear to auscultation bilaterally Cardio:  tachy, no murmur  Lab Results  Basename 06/11/11 0500 06/10/11 1459 06/10/11 0500  WBC 35.2* -- 29.7*  HGB 9.6* -- 11.1*  HCT 29.2* -- 32.1*  NA 136 134* --  K 4.6 4.8 --  CL 100 98 --  CO2 25 24 --  BUN 61* 76* --  CREATININE 2.36* 2.72* --  GLU -- -- --   Liver Panel  Basename 06/11/11 0500 06/10/11 1459  PROT -- --  ALBUMIN 2.4* 2.6*  AST -- --  ALT -- --  ALKPHOS -- --  BILITOT -- --  BILIDIR -- --  IBILI -- --   Sedimentation Rate No results found for this basename: ESRSEDRATE in the last 72 hours C-Reactive Protein No results found for this basename: CRP:2 in the last 72 hours  Microbiology: Recent Results (from the past 240 hour(s))  URINE CULTURE     Status: Normal   Collection Time   06/03/11 10:53 PM      Component Value Range Status Comment   Specimen Description URINE, RANDOM   Final    Special Requests ADDED [id number]   Final    Culture  Setup Time [id number]   Final    Colony Count >=100,000 COLONIES/ML   Final    Culture     Final    Value: Multiple bacterial morphotypes present, none predominant. Suggest appropriate recollection if clinically indicated.   Report Status 06/05/2011 FINAL   Final   CULTURE, BLOOD (ROUTINE X 2)     Status: Normal   Collection Time   06/03/11 11:45 PM      Component Value Range Status Comment   Specimen Description BLOOD RIGHT ARM   Final    Special Requests BOTTLES DRAWN AEROBIC AND ANAEROBIC 10CC   Final    Culture  Setup Time [id number]   Final    Culture NO GROWTH 5 DAYS   Final    Report Status 06/10/2011 FINAL   Final   CULTURE, BLOOD (ROUTINE X 2)     Status: Normal   Collection Time   06/03/11 11:50 PM      Component Value Range Status Comment   Specimen Description BLOOD RIGHT ARM   Final    Special Requests     Final    Value: BOTTLES DRAWN AEROBIC AND ANAEROBIC 5CC AEROBOC 3CC ANAEROBIC  Culture  Setup Time [id number]   Final    Culture NO GROWTH 5 DAYS   Final    Report Status 06/10/2011 FINAL    Final   MRSA PCR SCREENING     Status: Normal   Collection Time   06/04/11  3:08 AM      Component Value Range Status Comment   MRSA by PCR NEGATIVE  NEGATIVE  Final   RESPIRATORY VIRUS PANEL (18 COMPONENTS)     Status: Abnormal   Collection Time   06/04/11  6:09 AM      Component Value Range Status Comment   Source - RVPAN NASAL MUCOSA   Final    Respiratory Syncytial Virus A NOT DETECTED   Final    Respiratory Syncytial Virus B NOT DETECTED   Final    Influenza A NOT DETECTED   Final    Influenza B NOT DETECTED   Final    Parainfluenza 1 NOT DETECTED   Final    Parainfluenza 2 NOT DETECTED   Final    Parainfluenza 3 DETECTED (*)  Final    Parainfluenza 4 NOT DETECTED   Final    Metapneumovirus NOT DETECTED   Final    Cox[place]ie and Echovirus NOT DETECTED   Final    Rhinovirus NOT DETECTED   Final    Adenovirus B NOT DETECTED   Final    Adenovirus E NOT DETECTED   Final    CoronavirusNL63 NOT DETECTED   Final    CoronavirusHKU1 NOT DETECTED   Final    Coronavirus229E NOT DETECTED   Final    CoronavirusOC43 NOT DETECTED   Final   CULTURE, EXPECTORATED SPUTUM-ASSESSMENT     Status: Normal   Collection Time   06/04/11  7:48 AM      Component Value Range Status Comment   Specimen Description SPUTUM   Final    Special Requests NONE   Final    Sputum evaluation     Final    Value: MICROSCOPIC FINDINGS SUGGEST THAT THIS SPECIMEN IS NOT REPRESENTATIVE OF LOWER RESPIRATORY SECRETIONS. PLEASE RECOLLECT.     CALLED TO D ORTIZ,RN 06/04/11 0911 BY K SCHULTZ   Report Status 06/04/2011 FINAL   Final   AFB CULTURE WITH SMEAR     Status: Normal (Preliminary result)   Collection Time   06/04/11 10:30 AM      Component Value Range Status Comment   Specimen Description BRONCHIAL WASHINGS   Final    Special Requests NONE   Final    ACID FAST SMEAR NO ACID FAST BACILLI SEEN   Final    Culture     Final    Value: CULTURE WILL BE EXAMINED FOR 6 WEEKS BEFORE ISSUING A FINAL REPORT   Report Status  PENDING   Incomplete   PNEUMOCYSTIS JIROVECI SMEAR BY DFA     Status: Normal   Collection Time   06/04/11 10:30 AM      Component Value Range Status Comment   Specimen Source-[facility] BRONCHIAL WASHINGS   Final    Pneumocystis jiroveci Ag NEGATIVE   Final Performed at [facility]  FUNGUS CULTURE W SMEAR     Status: Normal (Preliminary result)   Collection Time   06/04/11 10:30 AM      Component Value Range Status Comment   Specimen Description BRONCHIAL WASHINGS   Final    Special Requests NONE   Final    Fungal Smear NO YEAST OR FUNGAL ELEMENTS SEEN  Final    Culture CULTURE IN PROGRESS FOR FOUR WEEKS   Final    Report Status PENDING   Incomplete   CULTURE, RESPIRATORY     Status: Normal   Collection Time   06/04/11 10:30 AM      Component Value Range Status Comment   Specimen Description BRONCHIAL WASHINGS   Final    Special Requests NONE   Final    Gram Stain     Final    Value: FEW WBC PRESENT,BOTH PMN AND MONONUCLEAR     NO SQUAMOUS EPITHELIAL CELLS SEEN     NO ORGANISMS SEEN   Culture NO GROWTH 2 DAYS   Final    Report Status 06/07/2011 FINAL   Final   HSV PCR     Status: Normal   Collection Time   06/04/11 10:30 AM      Component Value Range Status Comment   HSV, PCR Not Detected  Not Detected  Final    HSV 2 , PCR Not Detected  Not Detected  Final    Specimen Source-HSVPCR NO GROWTH   Final   CULTURE, BLOOD (ROUTINE X 2)     Status: Normal   Collection Time   06/04/11  3:15 PM      Component Value Range Status Comment   Specimen Description BLOOD RIGHT HAND   Final    Special Requests BOTTLES DRAWN AEROBIC AND ANAEROBIC 10CC   Final    Culture  Setup Time [id number]   Final    Culture NO GROWTH 5 DAYS   Final    Report Status 06/11/2011 FINAL   Final   CULTURE, BLOOD (ROUTINE X 2)     Status: Normal   Collection Time   06/04/11  3:27 PM      Component Value Range Status Comment   Specimen Description BLOOD LEFT HAND   Final    Special Requests BOTTLES  DRAWN AEROBIC ONLY 3CC   Final    Culture  Setup Time [id number]   Final    Culture NO GROWTH 5 DAYS   Final    Report Status 06/11/2011 FINAL   Final   CULTURE, BLOOD (ROUTINE X 2)     Status: Normal (Preliminary result)   Collection Time   06/08/11  4:15 AM      Component Value Range Status Comment   Specimen Description BLOOD LEFT HAND   Final    Special Requests BOTTLES DRAWN AEROBIC ONLY 10CC   Final    Culture  Setup Time [id number]   Final    Culture     Final    Value:        BLOOD CULTURE RECEIVED NO GROWTH TO DATE CULTURE WILL BE HELD FOR 5 DAYS BEFORE ISSUING A FINAL NEGATIVE REPORT   Report Status PENDING   Incomplete   CULTURE, BLOOD (ROUTINE X 2)     Status: Normal (Preliminary result)   Collection Time   06/08/11  4:20 AM      Component Value Range Status Comment   Specimen Description BLOOD LEFT HAND   Final    Special Requests BOTTLES DRAWN AEROBIC ONLY 10CC   Final    Culture  Setup Time [id number]   Final    Culture     Final    Value:        BLOOD CULTURE RECEIVED NO GROWTH TO DATE CULTURE WILL BE HELD FOR 5 DAYS BEFORE ISSUING A FINAL NEGATIVE REPORT   Report Status PENDING  Incomplete     Studies/Results: Dg Abd 1 View  06/11/2011  *RADIOLOGY REPORT*  Clinical Data: Panda tube placement under fluoroscopy.  ABDOMEN - 1 VIEW  Comparison: No priors.  Findings: Single fluoroscopic image after placement of tube and injection of the contrast material demonstrates the tip of the feeding tube in the distal duodenum.  IMPRESSION: 1.  Tip of the feeding tube is in the distal duodenum.  Original Report Authenticated By: Florencia Reasons, M.D.   Dg Chest Port 1 View  06/11/2011  *RADIOLOGY REPORT*  Clinical Data: Intubated patient.  PORTABLE CHEST - 1 VIEW  Comparison: Chest 06/09/2011 and 06/10/2011.  Findings: Support tubes and lines are unchanged in good position. Cardiomegaly again noted.  Right effusion and basilar airspace disease show some improvement.  Dense  opacity left lung base is unchanged.  IMPRESSION: Improved right effusion and basilar airspace disease.  No other change.  Original Report Authenticated By: Bernadene Bell. Maricela Curet, M.D.   Dg Chest Port 1 View  06/10/2011  *RADIOLOGY REPORT*  Clinical Data: Respiratory failure.  Short of breath.  PORTABLE CHEST - 1 VIEW  Comparison: 06/09/2011.  Findings: Cardiomegaly.  Patient rotated to the left.  Endotracheal tube and enteric tube appear unchanged.  The right IJ vascular sheath appears remain present.  Pleural effusions, basilar atelectasis and mild pulmonary edema persist.  Aeration is unchanged compared to prior allowing for technique.  The left IJ central line tip is difficult to visualize due to overlying leads. Study is technically suboptimal, under penetrated due to the airspace disease.  IMPRESSION: No interval change.  Stable support apparatus.  Original Report Authenticated By: Andreas Newport, M.D.   Dg Chest Port 1 View  06/09/2011  *RADIOLOGY REPORT*  Clinical Data: H D cath placement  PORTABLE CHEST - 1 VIEW  Comparison: 06/09/2011 at 0527 hours  Findings: Endotracheal tube with tip about 3.5 cm above the carina. Enteric tube tip is not visualized but appears to be below the left hemidiaphragm, likely in the stomach.  Right-sided central venous catheter placed.  Tip is difficult to visualize but appears to be in the low SVC region.  Left central venous catheter placed with tip in the low SVC region.  No pneumothorax.  Persistent infiltration in the right lung base with right pleural effusion. Cardiac enlargement.  IMPRESSION: Appliances appear to be in satisfactory location.  No pneumothorax. Persistent right pleural effusion with basilar atelectasis or infiltration.  Original Report Authenticated By: Marlon Pel, M.D.     Assessment/Plan: Pneumonia with sepsis - Fungal antibodies negative, remains on pressors.  Antibacterial therapy has not been effective making the parainfluenza virus  most likely culprit with cardiac involvment.  There is data in transplant patients that ribavirin with IVIg has some benefit to treat.  Ribavirin dosing in CRRT though is unknown but pharmacokinetic studies in HD patients do show about 50% less clearance in HD than with normal renal function.  Therefore, it would support the use of a dose reduced amount and will err on lower in this patient with a low hemoglobin. We will watch Hgb 2 times daily.  Look for hemolytic anemia if there is significant change.   Ribavirin won't be available until tomorrow, 5/16.  Will d/c evening Hgb.  IVIg will start today.    Dr. Daiva Eves on tomorrow.   Mairead Schwarzkopf Infectious Diseases 06/11/2011, 12:39 PM

## 2011-06-11 NOTE — Procedures (Signed)
Mini BAL Procedure Note Chad [name] [id number] [birth year]  Procedure: Mini Bronchial Alveolar Lavage  Procedure Details: In preparation for procedure, Patient hyper-oxygenated with 100 % FiO2 Sterile Technique used:gloves, gown and mask Amount of Saline administered: 40 (ml) Specimen amount collected: 8 (ml)  Evaluation: BP 137/97  Pulse 130  Temp(Src) 97.5 F (36.4 C) (Oral)  Resp 12  Ht 5\' 11"  (1.803 m)  Wt 309 lb 11.9 oz (140.5 kg)  BMI 43.20 kg/m2  SpO2 97% O2 sats: stable throughout Breath Sounds: Diminished and Rhonch Patient's Current Condition: stable Complications: No apparent complications Patient did tolerate procedure well.   Cort Dragoo, Aloha Gell 06/11/2011, 7:45 PM

## 2011-06-11 NOTE — Progress Notes (Signed)
ASSName: Chad [name] MRN: [id number] DOB: 1955/03/29    LOS: 8  PCCM  NOTE  History of Present Illness: [name] s/p renal Tx, immune suppressed, adm 5/08 to [facility] service with 3 days of cough, shortness of breath and fever. Progressive resp failure resulting in intubation 5/8.   Lines / Drains: R IJ HD cath 5/12 >> 5/13 (inadvertent removal) Aline 5/8>>>out R IJ HD cath 5/13 >>  Left IJ 5/8>>> ETT 5/8>>>   Cultures: BAL bronch 5/8>>>ng    AFB >> neg   Viral panel >> POS parainfluenza virus   Pneumocystis >> neg   Fungus >> neg   HSV >> neg BC 5/7>>>neg  Blood X 2 5/12 >>   Antibiotics: 5/7 levoflox>>>5/10 5/7 bactrim >>>5/10 5/7 micafungin >> 5/14 5/7 vanc >>  5/10 cefepime >> 5/15 Ribaviran/IVIG >>   Tests / Events: 5/8 hypoxic resp failure, intubated, near arrest 5/8 FOB - diffuse airway edema, diffuse bloody mucoid secretions 5/11 worsening shock , on sepsis protocol + dobut for low co-ox 5/12 Echocardiogram:  LVEF 15-20%. Diffuse HK 5/15 Family mtg: continue full aggressive support but NCB if arrests. To reassess in 4-5 days. If no progress, consider withdrawal at that time 5/15 Trial of Ribaviran/IVIG  Subj: No change. RASS -2, +/-  F/C on WUA per RN  Vital Signs: Temp:  [97.6 F (36.4 C)-98.5 F (36.9 C)] 97.7 F (36.5 C) (05/15 1545) Pulse Rate:  [104-135] 110  (05/15 1345) Resp:  [14-23] 15  (05/15 1345) BP: (68-124)/(41-67) 91/48 mmHg (05/15 1345) SpO2:  [96 %-100 %] 100 % (05/15 1345) FiO2 (%):  [50 %-70 %] 50 % (05/15 1600) Weight:  [140.5 kg (309 lb 11.9 oz)] 140.5 kg (309 lb 11.9 oz) (05/15 0500) I/O last 3 completed shifts: In: 4133.9 [I.V.:3673.9; NG/GT:260; IV Piggyback:200] Out: 6991 [Urine:20; Emesis/NG output:1400; Other:5321; Stool:250]  Physical Examination: General: Synchronous, sedated, not F/C Neuro:  Nonfocal, sedated HEENT:  Line clean Neck:  jvd increased Cardiovascular:  ST, irregularly irregular, no murmurs  heard Lungs:  No wheeze, + dependent rales Abdomen:  Soft, bs wnl no r/g Lower ext- severe chronic edema  Ventilator settings: Vent Mode:  [-] PRVC FiO2 (%):  [50 %-70 %] 50 % Set Rate:  [16 bmp] 16 bmp Vt Set:  [450 mL] 450 mL PEEP:  [10 cmH20-12 cmH20] 10 cmH20 Plateau Pressure:  [7 cmH20-19 cmH20] 7 cmH20  Labs and Imaging:   CXR: no new film CBC    Component Value Date/Time   WBC 35.2* 06/11/2011 0500   RBC 3.23* 06/11/2011 0500   HGB 10.9* 06/11/2011 1231   HCT 32.0* 06/11/2011 1231   PLT 115* 06/11/2011 0500   MCV 90.4 06/11/2011 0500   MCH 29.7 06/11/2011 0500   MCHC 32.9 06/11/2011 0500   RDW 17.1* 06/11/2011 0500   LYMPHSABS 1.0 06/07/2011 2105   MONOABS 0.3 06/07/2011 2105   EOSABS 0.0 06/07/2011 2105   BASOSABS 0.0 06/07/2011 2105    BMET    Component Value Date/Time   NA 134* 06/11/2011 1410   K 4.4 06/11/2011 1410   CL 98 06/11/2011 1410   CO2 27 06/11/2011 1410   GLUCOSE 173* 06/11/2011 1410   BUN 54* 06/11/2011 1410   CREATININE 2.14* 06/11/2011 1410   CALCIUM 8.7 06/11/2011 1410   GFRNONAA 33* 06/11/2011 1410   GFRAA 39* 06/11/2011 1410     Assessment and Plan: 1. Pulmonary  - Acute respiratory failure  PNA, immunosuppressed Plan - - Cont ARDS  protocol - Cont abx as per ID - Recheck resp virus panel  2. CVS Cardiomyopathy of unclear etiology - suspect viral, Shock - cardiogenic and/or septic, PSVT/A fib/flutter - rate control improved on phenylephrine - Amiodarone started per cards.  - Wean pressors for MAP > 60 mmHg - Cont stress dose steroids - dose decreased 5/15 to 50 q 8 hrs  3. Renal Renal TX, ATN, worsening acidosis & mild hyperkalemia - Cont CVVH per Renal  4. ID PNA Immunocompromised host Empiric HCAP   - abx Per ID Discussed at length with ID 5/15. 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 Will recheck resp virus panel and begin these therapies  4. Heme Leukocytosis, low plat  -  UGI bleeding from OGT - will D/C all heparins - SCDs for DVT prophy  5. Endo - Cont CBGs, SSI  6. MIld agitation - Cont sedation protocol - Daily WUA beginning 5/14  7. GI/ Nutrition - not tolerating gastric feedings. - Resume TFs via post pyloric panda tube   8. H/o Hep C - monitor LFTs periodically    Best practices / Disposition: -->ICU status under PCCM -->full Rx but NCB if suffers cardiac arrest -->SCDs -->Protonix for GI Px -->ventilator bundle -->TFs   I met with family 5/15. I explained Mr Gurski critical illness and lack of discernible improvement despite our high level of support. They understand that we have nearly reached the limits of what we have to offer therapeutically. We will see if there is any discernible improvement with the initiation of Ribaviran and IVIG. We will continue our high level of support through the WE. If not measurably better by first of next week, we will reconvene and discuss alternatives, specifically transitioning to full comfort care  45 mins CCM time  Billy Fischer, MD;  PCCM service; Mobile [phone]

## 2011-06-11 NOTE — Progress Notes (Signed)
Subjective:  Remains critically ill on pressors.  Got panda placed this AM.  CRRT seems to be running OK.  Likely having a GIB, now hgb has dropped.    Objective Vital signs in last 24 hours: Filed Vitals:   06/11/11 0400 06/11/11 0500 06/11/11 0600 06/11/11 0700  BP: 68/41 76/41 90/47  108/57  Pulse: 135 113 111 130  Temp:      TempSrc:      Resp: 17 15 17 23   Height:      Weight:  140.5 kg (309 lb 11.9 oz)    SpO2: 96% 97% 99% 100%   Weight change: -6.7 kg (-14 lb 12.3 oz)  Intake/Output Summary (Last 24 hours) at 06/11/11 0801 Last data filed at 06/11/11 0700  Gross per 24 hour  Intake 2844.16 ml  Output   4751 ml  Net -1906.84 ml   Labs: Basic Metabolic Panel:  Lab 06/11/11 9604 06/10/11 1459 06/10/11 0500  NA 136 134* 134*  K 4.6 4.8 4.7  CL 100 98 94*  CO2 25 24 23   GLUCOSE 144* 142* 133*  BUN 61* 76* 94*  CREATININE 2.36* 2.72* 3.26*  CALCIUM 8.1* 7.7* 7.7*  ALB -- -- --  PHOS 4.5 5.1* 5.8*   Liver Function Tests:  Lab 06/11/11 0500 06/10/11 1459 06/10/11 0500 06/08/11 0230 06/07/11 0500 06/06/11 0500  AST -- -- -- 85* 34 32  ALT -- -- -- 37 16 16  ALKPHOS -- -- -- 54 44 36*  BILITOT -- -- -- 0.7 0.4 0.4  PROT -- -- -- 6.6 6.1 6.1  ALBUMIN 2.4* 2.6* 2.6* -- -- --   No results found for this basename: LIPASE:3,AMYLASE:3 in the last 168 hours No results found for this basename: AMMONIA:3 in the last 168 hours CBC:  Lab 06/11/11 0500 06/10/11 0500 06/09/11 0441 06/08/11 0230 06/07/11 2105 06/07/11 0500 06/06/11 0500  WBC 35.2* 29.7* 20.5* -- -- -- --  NEUTROABS -- -- -- -- 14.6* 14.4* 16.6*  HGB 9.6* 11.1* 11.1* -- -- -- --  HCT 29.2* 32.1* 33.3* -- -- -- --  MCV 90.4 85.8 87.2 87.6 87.3 -- --  PLT 115* 121* 110* -- -- -- --   Cardiac Enzymes:  Lab 06/08/11 2345 06/08/11 1509 06/08/11 1000 06/07/11 2105 06/04/11 1410  CKTOTAL 220 247* 246* 138 163  CKMB 6.7* 6.0* 5.7* 2.9 2.1  CKMBINDEX -- -- -- -- --  TROPONINI 0.81* 0.93* 0.59* <0.30 <0.30    CBG:  Lab 06/11/11 0351 06/11/11 0005 06/10/11 1942 06/10/11 1609 06/10/11 1204  GLUCAP 139* 140* 143* 149* 152*    Iron Studies: No results found for this basename: IRON,TIBC,TRANSFERRIN,FERRITIN in the last 72 hours Studies/Results: Dg Chest Port 1 View  06/11/2011  *RADIOLOGY REPORT*  Clinical Data: Intubated patient.  PORTABLE CHEST - 1 VIEW  Comparison: Chest 06/09/2011 and 06/10/2011.  Findings: Support tubes and lines are unchanged in good position. Cardiomegaly again noted.  Right effusion and basilar airspace disease show some improvement.  Dense opacity left lung base is unchanged.  IMPRESSION: Improved right effusion and basilar airspace disease.  No other change.  Original Report Authenticated By: Bernadene Bell. Maricela Curet, M.D.   Dg Chest Port 1 View  06/10/2011  *RADIOLOGY REPORT*  Clinical Data: Respiratory failure.  Short of breath.  PORTABLE CHEST - 1 VIEW  Comparison: 06/09/2011.  Findings: Cardiomegaly.  Patient rotated to the left.  Endotracheal tube and enteric tube appear unchanged.  The right IJ vascular sheath appears remain present.  Pleural  effusions, basilar atelectasis and mild pulmonary edema persist.  Aeration is unchanged compared to prior allowing for technique.  The left IJ central line tip is difficult to visualize due to overlying leads. Study is technically suboptimal, under penetrated due to the airspace disease.  IMPRESSION: No interval change.  Stable support apparatus.  Original Report Authenticated By: Andreas Newport, M.D.   Dg Chest Port 1 View  06/09/2011  *RADIOLOGY REPORT*  Clinical Data: H D cath placement  PORTABLE CHEST - 1 VIEW  Comparison: 06/09/2011 at 0527 hours  Findings: Endotracheal tube with tip about 3.5 cm above the carina. Enteric tube tip is not visualized but appears to be below the left hemidiaphragm, likely in the stomach.  Right-sided central venous catheter placed.  Tip is difficult to visualize but appears to be in the low SVC region.   Left central venous catheter placed with tip in the low SVC region.  No pneumothorax.  Persistent infiltration in the right lung base with right pleural effusion. Cardiac enlargement.  IMPRESSION: Appliances appear to be in satisfactory location.  No pneumothorax. Persistent right pleural effusion with basilar atelectasis or infiltration.  Original Report Authenticated By: Marlon Pel, M.D.   Medications: Infusions:    . sodium chloride 20 mL/hr at 06/09/11 2000  . amiodarone (NEXTERONE PREMIX) 360 mg/200 mL dextrose 0.5 mg/min (06/10/11 2000)  . fentaNYL infusion INTRAVENOUS 100 mcg/hr (06/10/11 1237)  . heparin 10,000 units/ 20 mL infusion syringe 1,350 Units/hr (06/11/11 0400)  . milrinone 0.25 mcg/kg/min (06/11/11 0500)  . phenylephrine (NEO-SYNEPHRINE) Adult infusion 140 mcg/min (06/11/11 0629)  . dialysis replacement fluid (prismasate) 300 mL/hr at 06/09/11 1026  . dialysis replacement fluid (prismasate) 200 mL/hr at 06/10/11 1430  . dialysate (PRISMASATE) 2,000 mL/hr at 06/11/11 0620  . vasopressin (PITRESSIN) infusion - *FOR SHOCK* 0.03 Units/min (06/10/11 0921)  . DISCONTD: norepinephrine (LEVOPHED) Adult infusion 13 mcg/min (06/10/11 1709)    Scheduled Medications:    . antiseptic oral rinse  15 mL Mouth Rinse QID  . calcitRIOL  1 mcg Oral Q M,W,F  . ceFEPime (MAXIPIME) IV  2 g Intravenous Q12H  . chlorhexidine  15 mL Mouth Rinse BID  . collagenase   Topical Daily  . feeding supplement (OXEPA)  1,000 mL Per Tube Q24H  . feeding supplement  30 mL Oral BID WC  . heparin subcutaneous  5,000 Units Subcutaneous Q8H  . hydrocortisone sod succinate (SOLU-CORTEF) injection  50 mg Intravenous Q6H  . insulin aspart  0-4 Units Subcutaneous Q4H  . midazolam      . mulitivitamin  5 mL Per Tube Daily  . pantoprazole sodium  40 mg Per Tube Q1200  . sirolimus  0.75 mg Oral Daily  . white petrolatum      . DISCONTD: feeding supplement (OXEPA)  1,000 mL Per Tube Q24H  . DISCONTD:  micafungin (MYCAMINE) IV  100 mg Intravenous Daily    have reviewed scheduled and prn medications.  Physical Exam: General: critically ill on vent.  Warmer blanket, sedated.  Did open eyes to stimuli Heart: tachy Lungs: CBS bilaterally Abdomen: slightly distended Extremities: chronic lymphedema but also pitting dependant edema Dialysis Access: R IJ cath placed 5/13   I Assessment/ Plan: Pt is a 56 y.o. yo male s/p renal transplant who was admitted on 06/03/2011 with PNA  pulm HTN, volume overload now with acute on chronic renal failure requiring institution of CRRT Assessment/Plan: 1. PNA/cardiomyopathy/hypotension- per CCM and cards.  Requiring significant vent and hemodynamic support.  Also cefepime,  being followed by ID as well 2. S/p renal transplant with CAN- now ARF-  Support with CRRT.  Labs are stable, had some disruption in treatment yesterday. continue same dialysate and replacement fluids.  Have added low dose heparin and is tolerating well.  Regarding renal transplant, will continue his home dose of sirolimus and stress dose steriods.  Baseline function was not great with creatinine of 2.  Minimal UOP, will watch 3. Anemia- will support with aranesp if hgb gets below 11.  Also giving heparin, now with GIB, will have to do citrate protocol  4. Secondary hyperparathyroidism- no treatment right now 5. HTN/volume very overloaded appearing.  Even though CVP is 10 and on pressors and tachycardic.  Attempting 50 cc vol removal, so far have been able even though on high dose pressors 6.  Critically ill- prognosis seems poor.   Ajeenah Heiny A   06/11/2011,8:01 AM  LOS: 8 days

## 2011-06-11 NOTE — Progress Notes (Signed)
Chaplain Note:  Chaplain visited with pt and a group of pt's friends.  Pt was in bed, intubated, receiving hemodialysis.  He appeared very groggy and his level of awareness was unclear.  Pt's friends were gathered around the pt's bed.  The friends were speaking words of encouragement to the pt and reading Scripture.  Chaplain provided spiritual comfort, support, and prayer for pt and pt's friends. Friends expressed appreciation for chaplain support.  Chaplain will follow up as needed.  06/11/11 1635  Clinical Encounter Type  Visited With Patient and family together  Visit Type Spiritual support  Referral From Nurse  Spiritual Encounters  Spiritual Needs Emotional;Prayer  Stress Factors  Patient Stress Factors Health changes;Major life changes;Family relationships  Family Stress Factors Family relationships;Loss of control   Verdie Shire, chaplain resident [phone]

## 2011-06-11 NOTE — Progress Notes (Signed)
Nutrition Follow-up  Patient remains intubated.  TF on hold due to high residuals per RN.  430 ml residuals documented 5/13.  TF being resumed today.  Diet Order:  Oxepa at 30 ml/h with Prostat 30 ml BID to provide 1280 kcals, 75 grams protein daily.  Meds: Scheduled Meds:   . antiseptic oral rinse  15 mL Mouth Rinse QID  . calcitRIOL  1 mcg Oral Q M,W,F  . ceFEPime (MAXIPIME) IV  2 g Intravenous Q12H  . chlorhexidine  15 mL Mouth Rinse BID  . collagenase   Topical Daily  . darbepoetin (ARANESP) injection - DIALYSIS  100 mcg Intravenous Q Wed-HD  . feeding supplement (OXEPA)  1,000 mL Per Tube Q24H  . feeding supplement  30 mL Oral BID WC  . heparin subcutaneous  5,000 Units Subcutaneous Q8H  . hydrocortisone sod succinate (SOLU-CORTEF) injection  50 mg Intravenous Q6H  . insulin aspart  0-4 Units Subcutaneous Q4H  . midazolam      . mulitivitamin  5 mL Per Tube Daily  . pantoprazole sodium  40 mg Per Tube Q1200  . sirolimus  0.75 mg Oral Daily  . white petrolatum      . DISCONTD: feeding supplement (OXEPA)  1,000 mL Per Tube Q24H  . DISCONTD: micafungin [facility]) IV  100 mg Intravenous Daily   Continuous Infusions:   . sodium chloride 20 mL/hr at 06/09/11 2000  . amiodarone (NEXTERONE PREMIX) 360 mg/200 mL dextrose 0.5 mg/min (06/11/11 0813)  . calcium gluconate infusion for CRRT 20 g (06/11/11 1018)  . fentaNYL infusion INTRAVENOUS 100 mcg/hr (06/10/11 1237)  . milrinone 0.25 mcg/kg/min (06/11/11 0500)  . phenylephrine (NEO-SYNEPHRINE) Adult infusion 140 mcg/min (06/11/11 0629)  . dialysis replacement fluid (prismasate) 200 mL/hr at 06/10/11 1430  . dialysate (PRISMASATE) 2,000 mL/hr at 06/11/11 0853  . sodium citrate 2 %/dextrose 2.5% solution 3000 mL 250 mL/hr at 06/11/11 1018  . vasopressin (PITRESSIN) infusion - *FOR SHOCK* 0.03 Units/min (06/10/11 0921)  . DISCONTD: heparin 10,000 units/ 20 mL infusion syringe Stopped (06/11/11 0800)  . DISCONTD: norepinephrine  (LEVOPHED) Adult infusion 13 mcg/min (06/10/11 1709)  . DISCONTD: dialysis replacement fluid (prismasate) 300 mL/hr at 06/09/11 1026   PRN Meds:.albuterol, fentaNYL, heparin, heparin, iohexol, midazolam, vasopressin (PITRESSIN) infusion - *FOR SHOCK*, DISCONTD: heparin  Labs:  CMP     Component Value Date/Time   NA 136 06/11/2011 0500   K 4.6 06/11/2011 0500   CL 100 06/11/2011 0500   CO2 25 06/11/2011 0500   GLUCOSE 144* 06/11/2011 0500   BUN 61* 06/11/2011 0500   CREATININE 2.36* 06/11/2011 0500   CALCIUM 8.1* 06/11/2011 0500   PROT 6.6 06/08/2011 0230   ALBUMIN 2.4* 06/11/2011 0500   AST 85* 06/08/2011 0230   ALT 37 06/08/2011 0230   ALKPHOS 54 06/08/2011 0230   BILITOT 0.7 06/08/2011 0230   GFRNONAA 30* 06/11/2011 0500   GFRAA 34* 06/11/2011 0500   CBG (last 3)   Basename 06/11/11 0827 06/11/11 0351 06/11/11 0005  GLUCAP 141* 139* 140*     Intake/Output Summary (Last 24 hours) at 06/11/11 1125 Last data filed at 06/11/11 1100  Gross per 24 hour  Intake 3052.66 ml  Output   4568 ml  Net -1515.34 ml    Weight Status:  140.5 kg (up from 132.7 kg on 5/9)  Re-estimated needs:  2415 kcals, 150-165 grams protein daily.  Nutrition Dx:  Inadequate oral intake, ongoing.  Goal:  Enteral nutrition to provide 60-70% of estimated calorie needs (  22-25 kcals/kg ideal body weight) and 100% of estimated protein needs, based on ASPEN guidelines for permissive underfeeding in critically ill obese individuals, unmet.  Intervention:    Resume TF with Oxepa at 30 ml/h, increase Prostat to 60 ml QID to provide a total of 1880 kcals (24 kcals/kg ideal body weight), 165 grams protein,  568 ml free water daily.  Recommend follow Adult Enteral Nutrition Protocol for checking residuals:  If residual > 400 ml, re-feed entire residual, continue TF, and recheck residual in 1 hour. - If residual remains > 400 ml, re-feed entire residual, decrease TF rate by 50% and recheck residual in 2 hours. - If residual  remains > 400 ml, re-feed entire residual and notify MD for consideration of prokinetic (if not already ordered) or further orders. - If residual < 400 ml, re-feed entire residual and resume TF at previous rate.    Monitor:  TF tolerance/adequacy, labs, weight trend.   Hettie Holstein Pager #:  [phone]

## 2011-06-12 DIAGNOSIS — N5089 Other specified disorders of the male genital organs: Secondary | ICD-10-CM

## 2011-06-12 DIAGNOSIS — J189 Pneumonia, unspecified organism: Secondary | ICD-10-CM

## 2011-06-12 LAB — CBC
HCT: 27.7 % — ABNORMAL LOW (ref 39.0–52.0)
HCT: 26.9 % — ABNORMAL LOW (ref 39.0–52.0)
MCH: 29.2 pg (ref 26.0–34.0)
MCHC: 33.2 g/dL (ref 30.0–36.0)
MCHC: 32 g/dL (ref 30.0–36.0)
MCV: 89.6 fL (ref 78.0–100.0)
MCV: 91.2 fL (ref 78.0–100.0)
Platelets: 89 10*3/uL — ABNORMAL LOW (ref 150–400)
RDW: 17.2 % — ABNORMAL HIGH (ref 11.5–15.5)
RDW: 17.7 % — ABNORMAL HIGH (ref 11.5–15.5)
WBC: 30.3 10*3/uL — ABNORMAL HIGH (ref 4.0–10.5)
WBC: 25.6 10*3/uL — ABNORMAL HIGH (ref 4.0–10.5)

## 2011-06-12 LAB — RENAL FUNCTION PANEL
Albumin: 2.3 g/dL — ABNORMAL LOW (ref 3.5–5.2)
Albumin: 2.1 g/dL — ABNORMAL LOW (ref 3.5–5.2)
BUN: 43 mg/dL — ABNORMAL HIGH (ref 6–23)
BUN: 36 mg/dL — ABNORMAL HIGH (ref 6–23)
BUN: 32 mg/dL — ABNORMAL HIGH (ref 6–23)
CO2: 30 mEq/L (ref 19–32)
Calcium: 9.8 mg/dL (ref 8.4–10.5)
Calcium: 10.2 mg/dL (ref 8.4–10.5)
Calcium: 10 mg/dL (ref 8.4–10.5)
Creatinine, Ser: 1.64 mg/dL — ABNORMAL HIGH (ref 0.50–1.35)
Creatinine, Ser: 1.53 mg/dL — ABNORMAL HIGH (ref 0.50–1.35)
GFR calc Af Amer: 47 mL/min — ABNORMAL LOW (ref >90)
GFR calc non Af Amer: 46 mL/min — ABNORMAL LOW (ref >90)
Glucose, Bld: 138 mg/dL — ABNORMAL HIGH (ref 70–99)
Glucose, Bld: 147 mg/dL — ABNORMAL HIGH (ref 70–99)
Phosphorus: 3.4 mg/dL (ref 2.3–4.6)
Phosphorus: 2.6 mg/dL (ref 2.3–4.6)
Phosphorus: 2.5 mg/dL (ref 2.3–4.6)

## 2011-06-12 LAB — POCT I-STAT EG7
Acid-Base Excess: 3 mmol/L — ABNORMAL HIGH (ref 0.0–2.0)
Acid-Base Excess: 4 mmol/L — ABNORMAL HIGH (ref 0.0–2.0)
Acid-Base Excess: 6 mmol/L — ABNORMAL HIGH (ref 0.0–2.0)
Acid-Base Excess: 9 mmol/L — ABNORMAL HIGH (ref 0.0–2.0)
Bicarbonate: 27.8 mEq/L — ABNORMAL HIGH (ref 20.0–24.0)
Bicarbonate: 28.6 mEq/L — ABNORMAL HIGH (ref 20.0–24.0)
Bicarbonate: 32.8 mEq/L — ABNORMAL HIGH (ref 20.0–24.0)
Bicarbonate: 33.6 mEq/L — ABNORMAL HIGH (ref 20.0–24.0)
Bicarbonate: 35.4 mEq/L — ABNORMAL HIGH (ref 20.0–24.0)
Calcium, Ion: 0.67 mmol/L — CL (ref 1.12–1.32)
Calcium, Ion: 1.1 mmol/L — ABNORMAL LOW (ref 1.12–1.32)
Calcium, Ion: 0.53 mmol/L — CL (ref 1.12–1.32)
Calcium, Ion: 0.55 mmol/L — CL (ref 1.12–1.32)
HCT: 34 % — ABNORMAL LOW (ref 39.0–52.0)
HCT: 33 % — ABNORMAL LOW (ref 39.0–52.0)
HCT: 34 % — ABNORMAL LOW (ref 39.0–52.0)
Hemoglobin: 11.9 g/dL — ABNORMAL LOW (ref 13.0–17.0)
Hemoglobin: 11.2 g/dL — ABNORMAL LOW (ref 13.0–17.0)
Hemoglobin: 11.2 g/dL — ABNORMAL LOW (ref 13.0–17.0)
Hemoglobin: 11.6 g/dL — ABNORMAL LOW (ref 13.0–17.0)
O2 Saturation: 67 %
O2 Saturation: 69 %
O2 Saturation: 68 %
O2 Saturation: 65 %
Patient temperature: 97.6
Patient temperature: 97.4
Patient temperature: 97.5
Patient temperature: 98
Potassium: 4.1 mEq/L (ref 3.5–5.1)
Potassium: 4.4 mEq/L (ref 3.5–5.1)
Potassium: 4.1 mEq/L (ref 3.5–5.1)
Potassium: 3.7 mEq/L (ref 3.5–5.1)
Sodium: 134 mEq/L — ABNORMAL LOW (ref 135–145)
Sodium: 138 mEq/L (ref 135–145)
Sodium: 138 mEq/L (ref 135–145)
TCO2: 29 mmol/L (ref 0–100)
TCO2: 30 mmol/L (ref 0–100)
TCO2: 37 mmol/L (ref 0–100)
pCO2, Ven: 57.4 mmHg — ABNORMAL HIGH (ref 45.0–50.0)
pCO2, Ven: 52.2 mmHg — ABNORMAL HIGH (ref 45.0–50.0)
pCO2, Ven: 56.3 mmHg — ABNORMAL HIGH (ref 45.0–50.0)
pCO2, Ven: 60.2 mmHg — ABNORMAL HIGH (ref 45.0–50.0)
pCO2, Ven: 54.6 mmHg — ABNORMAL HIGH (ref 45.0–50.0)
pH, Ven: 7.332 — ABNORMAL HIGH (ref 7.250–7.300)
pH, Ven: 7.311 — ABNORMAL HIGH (ref 7.250–7.300)
pH, Ven: 7.399 — ABNORMAL HIGH (ref 7.250–7.300)
pO2, Ven: 38 mmHg (ref 30.0–45.0)
pO2, Ven: 39 mmHg (ref 30.0–45.0)
pO2, Ven: 33 mmHg (ref 30.0–45.0)

## 2011-06-12 LAB — GLUCOSE, CAPILLARY
Glucose-Capillary: 170 mg/dL — ABNORMAL HIGH (ref 70–99)
Glucose-Capillary: 154 mg/dL — ABNORMAL HIGH (ref 70–99)
Glucose-Capillary: 152 mg/dL — ABNORMAL HIGH (ref 70–99)
Glucose-Capillary: 149 mg/dL — ABNORMAL HIGH (ref 70–99)

## 2011-06-12 LAB — CYTOMEGALOVIRUS PCR, QUALITATIVE: Cytomegalovirus DNA: NOT DETECTED

## 2011-06-12 LAB — CALCIUM, IONIZED: Calcium, Ion: 1.14 mmol/L (ref 1.12–1.32)

## 2011-06-12 MED ORDER — MIDAZOLAM HCL 5 MG/ML IJ SOLN: 2.0000 mg | INTRAMUSCULAR | Status: DC | PRN

## 2011-06-12 MED ORDER — HALOPERIDOL LACTATE 5 MG/ML IJ SOLN
5.0000 mg | Freq: Once | INTRAMUSCULAR | Status: AC
  Administered 2011-06-12: 5 mg via INTRAVENOUS
  Filled 2011-06-12: qty 1

## 2011-06-12 MED ORDER — HALOPERIDOL LACTATE 5 MG/ML IJ SOLN
10.0000 mg | INTRAMUSCULAR | Status: DC | PRN
  Administered 2011-06-12: 10 mg via INTRAVENOUS
  Filled 2011-06-12: qty 2

## 2011-06-12 MED ORDER — MIDAZOLAM HCL 2 MG/2ML IJ SOLN
INTRAMUSCULAR | Status: AC
  Administered 2011-06-12: 2 mg
  Filled 2011-06-12: qty 2

## 2011-06-12 MED ORDER — PRISMASOL BGK 4/2.5 32-4-2.5 MEQ/L IV SOLN
INTRAVENOUS | Status: DC
  Administered 2011-06-12 – 2011-06-16 (×3): via INTRAVENOUS_CENTRAL
  Filled 2011-06-12: qty 5000

## 2011-06-12 NOTE — Progress Notes (Signed)
Subjective:  Remains critically ill on pressors, however, they have been weaned.  His O2 requirement is down as well.  No issues with the CRRT  Objective Vital signs in last 24 hours: Filed Vitals:   06/12/11 0410 06/12/11 0500 06/12/11 0600 06/12/11 0700  BP:  114/71 110/61 141/70  Pulse: 112 113 109 133  Temp:      TempSrc:      Resp: 19 15 16 15   Height:      Weight:  142.2 kg (313 lb 7.9 oz)    SpO2: 100% 100% 100% 100%   Weight change: 1.7 kg (3 lb 12 oz)  Intake/Output Summary (Last 24 hours) at 06/12/11 0723 Last data filed at 06/12/11 0600  Gross per 24 hour  Intake 4813.89 ml  Output   5133 ml  Net -319.11 ml   Labs: Basic Metabolic Panel:  Lab 06/12/11 1610 06/11/11 2315 06/11/11 2310 06/11/11 2200 06/11/11 1410  NA 133* 135 137 -- --  K 4.2 4.3 4.0 -- --  CL 93* -- -- 95* 98  CO2 30 -- -- 29 27  GLUCOSE 138* -- -- 145* 173*  BUN 43* -- -- 46* 54*  CREATININE 1.83* -- -- 1.92* 2.14*  CALCIUM 9.8 -- -- 9.1 8.7  ALB -- -- -- -- --  PHOS 3.4 -- -- 3.7 4.2   Liver Function Tests:  Lab 06/12/11 0345 06/11/11 2200 06/11/11 1410 06/08/11 0230 06/07/11 0500 06/06/11 0500  AST -- -- -- 85* 34 32  ALT -- -- -- 37 16 16  ALKPHOS -- -- -- 54 44 36*  BILITOT -- -- -- 0.7 0.4 0.4  PROT -- -- -- 6.6 6.1 6.1  ALBUMIN 2.3* 2.1* 2.3* -- -- --   No results found for this basename: LIPASE:3,AMYLASE:3 in the last 168 hours No results found for this basename: AMMONIA:3 in the last 168 hours CBC:  Lab 06/12/11 0345 06/11/11 2315 06/11/11 2310 06/11/11 1650 06/11/11 0500 06/10/11 0500 06/09/11 0441 06/07/11 2105 06/07/11 0500 06/06/11 0500  WBC 30.3* -- -- 32.0* 35.2* -- -- -- -- --  NEUTROABS -- -- -- -- -- -- -- 14.6* 14.4* 16.6*  HGB 9.2* 10.2* 10.9* -- -- -- -- -- -- --  HCT 27.7* 30.0* 32.0* -- -- -- -- -- -- --  MCV 89.6 -- -- 89.1 90.4 85.8 87.2 -- -- --  PLT 89* -- -- 102* 115* -- -- -- -- --   Cardiac Enzymes:  Lab 06/08/11 2345 06/08/11 1509 06/08/11 1000  06/07/11 2105  CKTOTAL 220 247* 246* 138  CKMB 6.7* 6.0* 5.7* 2.9  CKMBINDEX -- -- -- --  TROPONINI 0.81* 0.93* 0.59* <0.30   CBG:  Lab 06/12/11 0358 06/11/11 2357 06/11/11 2009 06/11/11 1655 06/11/11 1138  GLUCAP 142* 128* 153* 159* 164*    Iron Studies: No results found for this basename: IRON,TIBC,TRANSFERRIN,FERRITIN in the last 72 hours Studies/Results: Dg Abd 1 View  06/11/2011  *RADIOLOGY REPORT*  Clinical Data: Panda tube placement under fluoroscopy.  ABDOMEN - 1 VIEW  Comparison: No priors.  Findings: Single fluoroscopic image after placement of tube and injection of the contrast material demonstrates the tip of the feeding tube in the distal duodenum.  IMPRESSION: 1.  Tip of the feeding tube is in the distal duodenum.  Original Report Authenticated By: Florencia Reasons, M.D.   Dg Chest Port 1 View  06/11/2011  *RADIOLOGY REPORT*  Clinical Data: Intubated patient.  PORTABLE CHEST - 1 VIEW  Comparison: Chest 06/09/2011  and 06/10/2011.  Findings: Support tubes and lines are unchanged in good position. Cardiomegaly again noted.  Right effusion and basilar airspace disease show some improvement.  Dense opacity left lung base is unchanged.  IMPRESSION: Improved right effusion and basilar airspace disease.  No other change.  Original Report Authenticated By: Bernadene Bell. D'ALESSIO, M.D.   Dg Naso G Tube Plc W/fl-no Rad  06/11/2011  CLINICAL DATA: Panda tube placement in post pyloric position   NASO G TUBE PLACEMENT WITH FLUORO  Fluoroscopy was utilized by the requesting physician.  No radiographic  interpretation.     Medications: Infusions:    . sodium chloride 20 mL/hr at 06/09/11 2000  . amiodarone (NEXTERONE PREMIX) 360 mg/200 mL dextrose 0.5 mg/min (06/12/11 0520)  . calcium gluconate infusion for CRRT 20 g (06/12/11 0403)  . fentaNYL infusion INTRAVENOUS 100 mcg/hr (06/11/11 1241)  . milrinone 0.25 mcg/kg/min (06/12/11 0300)  . phenylephrine (NEO-SYNEPHRINE) Adult infusion 50  mcg/min (06/12/11 0600)  . dialysis replacement fluid (prismasate) 200 mL/hr at 06/11/11 1714  . dialysate (PRISMASATE) 2,000 mL/hr at 06/12/11 0313  . sodium citrate 2 %/dextrose 2.5% solution 3000 mL 250 mL/hr at 06/12/11 0652  . vasopressin (PITRESSIN) infusion - *FOR SHOCK* 0.03 Units/min (06/11/11 1500)  . DISCONTD: heparin 10,000 units/ 20 mL infusion syringe Stopped (06/11/11 0800)  . DISCONTD: dialysis replacement fluid (prismasate) 300 mL/hr at 06/09/11 1026    Scheduled Medications:    . antiseptic oral rinse  15 mL Mouth Rinse QID  . calcitRIOL  1 mcg Oral Q M,W,F  . ceFEPime (MAXIPIME) IV  2 g Intravenous Q12H  . chlorhexidine  15 mL Mouth Rinse BID  . collagenase   Topical Daily  . darbepoetin (ARANESP) injection - DIALYSIS  100 mcg Intravenous Q Wed-HD  . feeding supplement (OXEPA)  1,000 mL Per Tube Q24H  . feeding supplement  60 mL Per Tube QID  . heparin subcutaneous  5,000 Units Subcutaneous Q8H  . hydrocortisone sod succinate (SOLU-CORTEF) injection  50 mg Intravenous Q8H  . IMMUNE GLOBLULIN (HUMAN) IV  0.5 g/kg Intravenous QODAY  . insulin aspart  0-4 Units Subcutaneous Q4H  . midazolam      . midazolam      . midazolam      . midazolam      . mulitivitamin  5 mL Per Tube Daily  . pantoprazole sodium  40 mg Per Tube BID  . Ribavirin  400 mg Per Tube TID  . sirolimus  0.75 mg Oral Daily  . DISCONTD: feeding supplement  30 mL Oral BID WC  . DISCONTD: hydrocortisone sod succinate (SOLU-CORTEF) injection  50 mg Intravenous Q6H  . DISCONTD: pantoprazole sodium  40 mg Per Tube Q1200  . DISCONTD: ribavirin  400 mg Oral TID    have reviewed scheduled and prn medications.  Physical Exam: General: critically ill on vent,  sedated.  Did open eyes to stimuli Heart: tachy Lungs: CBS bilaterally Abdomen: slightly distended Extremities: chronic lymphedema but also pitting dependant edema Dialysis Access: R IJ cath placed 5/13   I Assessment/ Plan: Pt is a 56 y.o.  yo male s/p renal transplant who was admitted on 06/03/2011 with PNA  pulm HTN, volume overload now with acute on chronic renal failure requiring institution of CRRT Assessment/Plan: 1. PNA/cardiomyopathy/hypotension- per CCM and cards.  Requiring significant vent and hemodynamic support, although does seem slightly better.  Cefepime, being followed by ID as well.  O2 req is down.  2. S/p renal transplant with CAN-  now ARF-  Support with CRRT.  Labs are stable,no disruption in treatment yesterday. continue same dialysate and replacement fluids.  On citrate protocol and is tolerating well.  Regarding renal transplant, will continue his home dose of sirolimus and stress dose steroids which were decreased 5/15.  Baseline function was not great with creatinine of 2.  Minimal UOP, will watch 3. Anemia- hgb dropped yesterday, but stable today.  Citrate protocol and aranesp 4. Secondary hyperparathyroidism- no treatment right now, will stop rocatrol as is not essential at this time and calcium up 5. HTN/volume very overloaded appearing still.  Tolerating volume removal and does seem to have responded 6.  Critically ill- prognosis seems poor, but dare I say he is getting better.    Shantoria Ellwood A   06/12/2011,7:23 AM  LOS: 9 days

## 2011-06-12 NOTE — Progress Notes (Signed)
Subjective: Intubated following commands  Lines / Drains:  R IJ HD cath 5/12 >> 5/13 (inadvertent removal)  Aline 5/8>>>out  R IJ HD cath 5/13 >>  Left IJ 5/8>>>  ETT 5/8>>>   Antibiotics:  Anti-infectives     Start     Dose/Rate Route Frequency Ordered Stop   06/12/11 1000   Ribavirin SOLN 400 mg        400 mg Per Tube 3 times daily 06/11/11 1346     06/11/11 1600   ribavirin (REBETOL) capsule 400 mg  Status:  Discontinued        400 mg Oral 3 times daily 06/11/11 1239 06/11/11 1343   06/08/11 2200   ceFEPIme (MAXIPIME) 2 g in dextrose 5 % 50 mL IVPB     Comments: Use Cefepime 1 g IV q12h for CrCl< 60 mL/min      2 g 100 mL/hr over 30 Minutes Intravenous Every 12 hours 06/08/11 1536 06/11/11 2320   06/08/11 0800   piperacillin-tazobactam (ZOSYN) IVPB 3.375 g  Status:  Discontinued        3.375 g 12.5 mL/hr over 240 Minutes Intravenous Every 8 hours 06/08/11 0149 06/08/11 1155   06/08/11 0600   micafungin (MYCAMINE) 100 mg in sodium chloride 0.9 % 100 mL IVPB  Status:  Discontinued        100 mg 100 mL/hr over 1 Hours Intravenous Daily 06/08/11 0308 06/10/11 1703   06/08/11 0200  piperacillin-tazobactam (ZOSYN) IVPB 3.375 g       3.375 g 100 mL/hr over 30 Minutes Intravenous  Once 06/08/11 0149 06/08/11 0254   06/06/11 0600   vancomycin (VANCOCIN) 1,750 mg in sodium chloride 0.9 % 500 mL IVPB  Status:  Discontinued        1,750 mg 250 mL/hr over 120 Minutes Intravenous Every 24 hours 06/05/11 0906 06/08/11 0403   06/05/11 2200   levofloxacin (LEVAQUIN) IVPB 750 mg  Status:  Discontinued     Comments: Use Levaquin 750 mg IV q48h for CrCl < 41mL/min      750 mg 100 mL/hr over 90 Minutes Intravenous Every 48 hours 06/04/11 0344 06/05/11 0908   06/05/11 1000   levofloxacin (LEVAQUIN) IVPB 750 mg  Status:  Discontinued     Comments: Use Levaquin 750 mg IV q48h for CrCl < 109mL/min      750 mg 100 mL/hr over 90 Minutes Intravenous Every 24 hours 06/05/11 0908 06/06/11 0908     06/04/11 1200   sulfamethoxazole-trimethoprim (BACTRIM) 664 mg in dextrose 5 % 500 mL IVPB  Status:  Discontinued        20 mg/kg/day  132.7 kg 361 mL/hr over 90 Minutes Intravenous 4 times per day 06/04/11 1039 06/06/11 0908   06/04/11 1100   micafungin (MYCAMINE) 100 mg in sodium chloride 0.9 % 100 mL IVPB  Status:  Discontinued        100 mg 100 mL/hr over 1 Hours Intravenous Daily 06/04/11 1030 06/06/11 0908   06/04/11 0600   ceFEPIme (MAXIPIME) 1 g in dextrose 5 % 50 mL IVPB  Status:  Discontinued     Comments: Use Cefepime 1 g IV q12h for CrCl< 60 mL/min      1 g 100 mL/hr over 30 Minutes Intravenous Every 12 hours 06/04/11 0344 06/08/11 1536   06/04/11 0600   vancomycin (VANCOCIN) 1,500 mg in sodium chloride 0.9 % 500 mL IVPB  Status:  Discontinued        1,500 mg 250 mL/hr over  120 Minutes Intravenous Every 24 hours 06/04/11 0425 06/05/11 0906   06/04/11 0430   levofloxacin (LEVAQUIN) IVPB 750 mg        750 mg 100 mL/hr over 90 Minutes Intravenous  Once 06/04/11 0354 06/04/11 0632   06/03/11 2330   vancomycin (VANCOCIN) IVPB 1000 mg/200 mL premix        1,000 mg 200 mL/hr over 60 Minutes Intravenous  Once 06/03/11 2326 06/04/11 0051   06/03/11 2330  piperacillin-tazobactam (ZOSYN) IVPB 3.375 g       3.375 g 12.5 mL/hr over 240 Minutes Intravenous  Once 06/03/11 2326 06/04/11 0521          Medications: Scheduled Meds:   . antiseptic oral rinse  15 mL Mouth Rinse QID  . ceFEPime (MAXIPIME) IV  2 g Intravenous Q12H  . chlorhexidine  15 mL Mouth Rinse BID  . collagenase   Topical Daily  . darbepoetin (ARANESP) injection - DIALYSIS  100 mcg Intravenous Q Wed-HD  . feeding supplement (OXEPA)  1,000 mL Per Tube Q24H  . feeding supplement  60 mL Per Tube QID  . heparin subcutaneous  5,000 Units Subcutaneous Q8H  . hydrocortisone sod succinate (SOLU-CORTEF) injection  50 mg Intravenous Q8H  . IMMUNE GLOBLULIN (HUMAN) IV  0.5 g/kg Intravenous QODAY  . insulin aspart   0-4 Units Subcutaneous Q4H  . midazolam      . midazolam      . midazolam      . mulitivitamin  5 mL Per Tube Daily  . pantoprazole sodium  40 mg Per Tube BID  . Ribavirin  400 mg Per Tube TID  . sirolimus  0.75 mg Oral Daily  . DISCONTD: calcitRIOL  1 mcg Oral Q M,W,F  . DISCONTD: feeding supplement  30 mL Oral BID WC  . DISCONTD: hydrocortisone sod succinate (SOLU-CORTEF) injection  50 mg Intravenous Q6H  . DISCONTD: pantoprazole sodium  40 mg Per Tube Q1200  . DISCONTD: ribavirin  400 mg Oral TID   Continuous Infusions:   . sodium chloride 20 mL/hr at 06/12/11 0752  . amiodarone (NEXTERONE PREMIX) 360 mg/200 mL dextrose 0.5 mg/min (06/12/11 0520)  . calcium gluconate infusion for CRRT 20 g (06/12/11 0752)  . fentaNYL infusion INTRAVENOUS 50 mcg/hr (06/12/11 0752)  . milrinone 0.25 mcg/kg/min (06/12/11 0752)  . phenylephrine (NEO-SYNEPHRINE) Adult infusion 70 mcg/min (06/12/11 0905)  . dialysis replacement fluid (prismasate)    . dialysate (PRISMASATE) 2,000 mL/hr at 06/12/11 0830  . sodium citrate 2 %/dextrose 2.5% solution 3000 mL 250 mL/hr at 06/12/11 0652  . vasopressin (PITRESSIN) infusion - *FOR SHOCK* Stopped (06/12/11 0500)  . DISCONTD: dialysis replacement fluid (prismasate) 200 mL/hr at 06/11/11 1714   PRN Meds:.albuterol, fentaNYL, heparin, heparin, midazolam, vasopressin (PITRESSIN) infusion - *FOR SHOCK*   Objective: Weight change: 3 lb 12 oz (1.7 kg)  Intake/Output Summary (Last 24 hours) at 06/12/11 1140 Last data filed at 06/12/11 1100  Gross per 24 hour  Intake 5177.63 ml  Output   5302 ml  Net -124.37 ml   Blood pressure 91/46, pulse 119, temperature 97.6 F (36.4 C), temperature source Oral, resp. rate 15, height 5\' 11"  (1.803 m), weight 313 lb 7.9 oz (142.2 kg), SpO2 99.00%. Temp:  [97.4 F (36.3 C)-97.9 F (36.6 C)] 97.6 F (36.4 C) (05/16 0825) Pulse Rate:  [104-136] 119  (05/16 1100) Resp:  [12-23] 15  (05/16 1100) BP: (88-141)/(44-121) 91/46  mmHg (05/16 1100) SpO2:  [97 %-100 %] 99 % (05/16 1100) FiO2 (%):  [  40 %-60 %] 40 % (05/16 0805) Weight:  [313 lb 7.9 oz (142.2 kg)] 313 lb 7.9 oz (142.2 kg) (05/16 0500)  Physical Exam: General: intubated, following commands HEENT: anicteric sclera, pupils reactive to light and accommodation, EOMI CVS irr irr  normal r,  no murmur rubs or gallops Chest: coarse breath sounds Abdomen: distended, pos bs Extremities: 2- edema Skin: no rashes Lymph: no new lymphadenopathy Neuro: nonfocal, follows commands  Lab Results:  Basename 06/12/11 0345 06/11/11 2315 06/11/11 1650  WBC 30.3* -- 32.0*  HGB 9.2* 10.2* --  HCT 27.7* 30.0* --  PLT 89* -- 102*    BMET  Basename 06/12/11 0345 06/11/11 2315 06/11/11 2200  NA 133* 135 --  K 4.2 4.3 --  CL 93* -- 95*  CO2 30 -- 29  GLUCOSE 138* -- 145*  BUN 43* -- 46*  CREATININE 1.83* -- 1.92*  CALCIUM 9.8 -- 9.1    Micro Results: Recent Results (from the past 240 hour(s))  URINE CULTURE     Status: Normal   Collection Time   06/03/11 10:53 PM      Component Value Range Status Comment   Specimen Description URINE, RANDOM   Final    Special Requests ADDED [id number]   Final    Culture  Setup Time [id number]   Final    Colony Count >=100,000 COLONIES/ML   Final    Culture     Final    Value: Multiple bacterial morphotypes present, none predominant. Suggest appropriate recollection if clinically indicated.   Report Status 06/05/2011 FINAL   Final   CULTURE, BLOOD (ROUTINE X 2)     Status: Normal   Collection Time   06/03/11 11:45 PM      Component Value Range Status Comment   Specimen Description BLOOD RIGHT ARM   Final    Special Requests BOTTLES DRAWN AEROBIC AND ANAEROBIC 10CC   Final    Culture  Setup Time [id number]   Final    Culture NO GROWTH 5 DAYS   Final    Report Status 06/10/2011 FINAL   Final   CULTURE, BLOOD (ROUTINE X 2)     Status: Normal   Collection Time   06/03/11 11:50 PM      Component Value Range Status  Comment   Specimen Description BLOOD RIGHT ARM   Final    Special Requests     Final    Value: BOTTLES DRAWN AEROBIC AND ANAEROBIC 5CC AEROBOC 3CC ANAEROBIC   Culture  Setup Time [id number]   Final    Culture NO GROWTH 5 DAYS   Final    Report Status 06/10/2011 FINAL   Final   MRSA PCR SCREENING     Status: Normal   Collection Time   06/04/11  3:08 AM      Component Value Range Status Comment   MRSA by PCR NEGATIVE  NEGATIVE  Final   RESPIRATORY VIRUS PANEL (18 COMPONENTS)     Status: Abnormal   Collection Time   06/04/11  6:09 AM      Component Value Range Status Comment   Source - RVPAN NASAL MUCOSA   Final    Respiratory Syncytial Virus A NOT DETECTED   Final    Respiratory Syncytial Virus B NOT DETECTED   Final    Influenza A NOT DETECTED   Final    Influenza B NOT DETECTED   Final    Parainfluenza 1 NOT DETECTED   Final    Parainfluenza 2  NOT DETECTED   Final    Parainfluenza 3 DETECTED (*)  Final    Parainfluenza 4 NOT DETECTED   Final    Metapneumovirus NOT DETECTED   Final    Cox[place]ie and Echovirus NOT DETECTED   Final    Rhinovirus NOT DETECTED   Final    Adenovirus B NOT DETECTED   Final    Adenovirus E NOT DETECTED   Final    CoronavirusNL63 NOT DETECTED   Final    CoronavirusHKU1 NOT DETECTED   Final    Coronavirus229E NOT DETECTED   Final    CoronavirusOC43 NOT DETECTED   Final   CULTURE, EXPECTORATED SPUTUM-ASSESSMENT     Status: Normal   Collection Time   06/04/11  7:48 AM      Component Value Range Status Comment   Specimen Description SPUTUM   Final    Special Requests NONE   Final    Sputum evaluation     Final    Value: MICROSCOPIC FINDINGS SUGGEST THAT THIS SPECIMEN IS NOT REPRESENTATIVE OF LOWER RESPIRATORY SECRETIONS. PLEASE RECOLLECT.     CALLED TO D ORTIZ,RN 06/04/11 0911 BY K SCHULTZ   Report Status 06/04/2011 FINAL   Final   AFB CULTURE WITH SMEAR     Status: Normal (Preliminary result)   Collection Time   06/04/11 10:30 AM      Component Value Range  Status Comment   Specimen Description BRONCHIAL WASHINGS   Final    Special Requests NONE   Final    ACID FAST SMEAR NO ACID FAST BACILLI SEEN   Final    Culture     Final    Value: CULTURE WILL BE EXAMINED FOR 6 WEEKS BEFORE ISSUING A FINAL REPORT   Report Status PENDING   Incomplete   PNEUMOCYSTIS JIROVECI SMEAR BY DFA     Status: Normal   Collection Time   06/04/11 10:30 AM      Component Value Range Status Comment   Specimen Source-[facility] BRONCHIAL WASHINGS   Final    Pneumocystis jiroveci Ag NEGATIVE   Final Performed at [facility]  FUNGUS CULTURE W SMEAR     Status: Normal (Preliminary result)   Collection Time   06/04/11 10:30 AM      Component Value Range Status Comment   Specimen Description BRONCHIAL WASHINGS   Final    Special Requests NONE   Final    Fungal Smear NO YEAST OR FUNGAL ELEMENTS SEEN   Final    Culture CULTURE IN PROGRESS FOR FOUR WEEKS   Final    Report Status PENDING   Incomplete   CULTURE, RESPIRATORY     Status: Normal   Collection Time   06/04/11 10:30 AM      Component Value Range Status Comment   Specimen Description BRONCHIAL WASHINGS   Final    Special Requests NONE   Final    Gram Stain     Final    Value: FEW WBC PRESENT,BOTH PMN AND MONONUCLEAR     NO SQUAMOUS EPITHELIAL CELLS SEEN     NO ORGANISMS SEEN   Culture NO GROWTH 2 DAYS   Final    Report Status 06/07/2011 FINAL   Final   HSV PCR     Status: Normal   Collection Time   06/04/11 10:30 AM      Component Value Range Status Comment   HSV, PCR Not Detected  Not Detected  Final    HSV 2 ,  PCR Not Detected  Not Detected  Final    Specimen Source-HSVPCR NO GROWTH   Final   CULTURE, BLOOD (ROUTINE X 2)     Status: Normal   Collection Time   06/04/11  3:15 PM      Component Value Range Status Comment   Specimen Description BLOOD RIGHT HAND   Final    Special Requests BOTTLES DRAWN AEROBIC AND ANAEROBIC 10CC   Final    Culture  Setup Time [id number]   Final    Culture NO GROWTH  5 DAYS   Final    Report Status 06/11/2011 FINAL   Final   CULTURE, BLOOD (ROUTINE X 2)     Status: Normal   Collection Time   06/04/11  3:27 PM      Component Value Range Status Comment   Specimen Description BLOOD LEFT HAND   Final    Special Requests BOTTLES DRAWN AEROBIC ONLY 3CC   Final    Culture  Setup Time [id number]   Final    Culture NO GROWTH 5 DAYS   Final    Report Status 06/11/2011 FINAL   Final   CULTURE, BLOOD (ROUTINE X 2)     Status: Normal (Preliminary result)   Collection Time   06/08/11  4:15 AM      Component Value Range Status Comment   Specimen Description BLOOD LEFT HAND   Final    Special Requests BOTTLES DRAWN AEROBIC ONLY 10CC   Final    Culture  Setup Time [id number]   Final    Culture     Final    Value:        BLOOD CULTURE RECEIVED NO GROWTH TO DATE CULTURE WILL BE HELD FOR 5 DAYS BEFORE ISSUING A FINAL NEGATIVE REPORT   Report Status PENDING   Incomplete   CULTURE, BLOOD (ROUTINE X 2)     Status: Normal (Preliminary result)   Collection Time   06/08/11  4:20 AM      Component Value Range Status Comment   Specimen Description BLOOD LEFT HAND   Final    Special Requests BOTTLES DRAWN AEROBIC ONLY 10CC   Final    Culture  Setup Time [id number]   Final    Culture     Final    Value:        BLOOD CULTURE RECEIVED NO GROWTH TO DATE CULTURE WILL BE HELD FOR 5 DAYS BEFORE ISSUING A FINAL NEGATIVE REPORT   Report Status PENDING   Incomplete     Studies/Results: Dg Abd 1 View  06/11/2011  *RADIOLOGY REPORT*  Clinical Data: Panda tube placement under fluoroscopy.  ABDOMEN - 1 VIEW  Comparison: No priors.  Findings: Single fluoroscopic image after placement of tube and injection of the contrast material demonstrates the tip of the feeding tube in the distal duodenum.  IMPRESSION: 1.  Tip of the feeding tube is in the distal duodenum.  Original Report Authenticated By: Florencia Reasons, M.D.   Dg Chest Port 1 View  06/11/2011  *RADIOLOGY REPORT*  Clinical  Data: Intubated patient.  PORTABLE CHEST - 1 VIEW  Comparison: Chest 06/09/2011 and 06/10/2011.  Findings: Support tubes and lines are unchanged in good position. Cardiomegaly again noted.  Right effusion and basilar airspace disease show some improvement.  Dense opacity left lung base is unchanged.  IMPRESSION: Improved right effusion and basilar airspace disease.  No other change.  Original Report Authenticated By: Bernadene Bell. Maricela Curet, M.D.   Dg Joslyn Hy  Plc W/fl-no Rad  06/11/2011  CLINICAL DATA: Panda tube placement in post pyloric position   NASO G TUBE PLACEMENT WITH FLUORO  Fluoroscopy was utilized by the requesting physician.  No radiographic  interpretation.        Assessment/Plan: Chad [name] is a 56 y.o. male with Pneumonia with sepsis - Fungal antibodies negative   Parainfluenza PCR positive, remains on pressors. Though overnight less need, and reduced O2 requirments though he only got IVIG and no RIBAVIRIN so far.   Antibacterial therapy has not been effective making the parainfluenza virus most likely culprit with cardiac involvment. There is data in transplant patients that ribavirin with IVIg has some benefit to treat. Ribavirin dosing in CRRT though is unknown but pharmacokinetic studies in HD patients do show about 50% less clearance in HD than with normal renal function.   We are going forward with ribavirin today and will monitor  Hgb 2 times daily. Look for hemolytic anemia if there is significant change.    LOS: 9 days   Acey Lav 06/12/2011, 11:40 AM

## 2011-06-12 NOTE — Consult Note (Signed)
Reason for Consult:Cardiomyopathy Referring Physician: CCM  Chad [name] is an 56 y.o. male.   Chad [name] is a 56 year old man with past history of remote renal transplant and immunosuppression was admitted with worsening cough fever and dyspnea. Found to have PNA and newly reduced EF 15-20%.  Has been tachycardic since admission and EKG shows atrial flutter with rapid ventricular response.  Amiodarone started 5/13.  Seems to be improving slowly. Now off vasopressin. Remains on neo and milrinone. Awake and follows some commands.   Remains on CVVHD pulling 50cc/hr. HR remains 110-130 on amio.  Off heparin due to ? GIB.   Notes from CCM, Renal and ID reviewed.   Past Medical History  Diagnosis Date  . Hypertension   . H/O kidney transplant     Past Surgical History  Procedure Date  . Nephrectomy transplanted organ      Medications:  Scheduled:    . antiseptic oral rinse  15 mL Mouth Rinse QID  . ceFEPime (MAXIPIME) IV  2 g Intravenous Q12H  . chlorhexidine  15 mL Mouth Rinse BID  . collagenase   Topical Daily  . darbepoetin (ARANESP) injection - DIALYSIS  100 mcg Intravenous Q Wed-HD  . feeding supplement (OXEPA)  1,000 mL Per Tube Q24H  . feeding supplement  60 mL Per Tube QID  . heparin subcutaneous  5,000 Units Subcutaneous Q8H  . hydrocortisone sod succinate (SOLU-CORTEF) injection  50 mg Intravenous Q8H  . IMMUNE GLOBLULIN (HUMAN) IV  0.5 g/kg Intravenous QODAY  . insulin aspart  0-4 Units Subcutaneous Q4H  . midazolam      . midazolam      . midazolam      . mulitivitamin  5 mL Per Tube Daily  . pantoprazole sodium  40 mg Per Tube BID  . Ribavirin  400 mg Per Tube TID  . sirolimus  0.75 mg Oral Daily  . DISCONTD: calcitRIOL  1 mcg Oral Q M,W,F  . DISCONTD: feeding supplement  30 mL Oral BID WC  . DISCONTD: hydrocortisone sod succinate (SOLU-CORTEF) injection  50 mg Intravenous Q6H  . DISCONTD: pantoprazole sodium  40 mg Per Tube Q1200  . DISCONTD: ribavirin   400 mg Oral TID    Results for orders placed during the hospital encounter of 06/03/11 (from the past 48 hour(s))  POCT ACTIVATED CLOTTING TIME     Status: Normal   Collection Time   06/10/11 10:24 AM      Component Value Range Comment   Activated Clotting Time 204     POCT ACTIVATED CLOTTING TIME     Status: Normal   Collection Time   06/10/11 11:13 AM      Component Value Range Comment   Activated Clotting Time 204     GLUCOSE, CAPILLARY     Status: Abnormal   Collection Time   06/10/11 12:04 PM      Component Value Range Comment   Glucose-Capillary 152 (*) 70 - 99 (mg/dL)   POCT ACTIVATED CLOTTING TIME     Status: Normal   Collection Time   06/10/11 12:18 PM      Component Value Range Comment   Activated Clotting Time 215     RENAL FUNCTION PANEL     Status: Abnormal   Collection Time   06/10/11  2:59 PM      Component Value Range Comment   Sodium 134 (*) 135 - 145 (mEq/L)    Potassium 4.8  3.5 - 5.1 (mEq/L)  Chloride 98  96 - 112 (mEq/L)    CO2 24  19 - 32 (mEq/L)    Glucose, Bld 142 (*) 70 - 99 (mg/dL)    BUN 76 (*) 6 - 23 (mg/dL)    Creatinine, Ser 1.61 (*) 0.50 - 1.35 (mg/dL)    Calcium 7.7 (*) 8.4 - 10.5 (mg/dL)    Phosphorus 5.1 (*) 2.3 - 4.6 (mg/dL)    Albumin 2.6 (*) 3.5 - 5.2 (g/dL)    GFR calc non Af Amer 25 (*) >90 (mL/min)    GFR calc Af Amer 29 (*) >90 (mL/min)   POCT ACTIVATED CLOTTING TIME     Status: Normal   Collection Time   06/10/11  3:58 PM      Component Value Range Comment   Activated Clotting Time 193     GLUCOSE, CAPILLARY     Status: Abnormal   Collection Time   06/10/11  4:09 PM      Component Value Range Comment   Glucose-Capillary 149 (*) 70 - 99 (mg/dL)   GLUCOSE, CAPILLARY     Status: Abnormal   Collection Time   06/10/11  7:42 PM      Component Value Range Comment   Glucose-Capillary 143 (*) 70 - 99 (mg/dL)   POCT ACTIVATED CLOTTING TIME     Status: Normal   Collection Time   06/10/11  8:56 PM      Component Value Range Comment    Activated Clotting Time 204     GLUCOSE, CAPILLARY     Status: Abnormal   Collection Time   06/11/11 12:05 AM      Component Value Range Comment   Glucose-Capillary 140 (*) 70 - 99 (mg/dL)    Comment 1 Documented in Chart      Comment 2 Notify RN     POCT ACTIVATED CLOTTING TIME     Status: Normal   Collection Time   06/11/11  1:02 AM      Component Value Range Comment   Activated Clotting Time 188     POCT ACTIVATED CLOTTING TIME     Status: Normal   Collection Time   06/11/11  1:27 AM      Component Value Range Comment   Activated Clotting Time 193     GLUCOSE, CAPILLARY     Status: Abnormal   Collection Time   06/11/11  3:51 AM      Component Value Range Comment   Glucose-Capillary 139 (*) 70 - 99 (mg/dL)    Comment 1 Documented in Chart      Comment 2 Notify RN     POCT ACTIVATED CLOTTING TIME     Status: Normal   Collection Time   06/11/11  4:12 AM      Component Value Range Comment   Activated Clotting Time 199     RENAL FUNCTION PANEL     Status: Abnormal   Collection Time   06/11/11  5:00 AM      Component Value Range Comment   Sodium 136  135 - 145 (mEq/L)    Potassium 4.6  3.5 - 5.1 (mEq/L)    Chloride 100  96 - 112 (mEq/L)    CO2 25  19 - 32 (mEq/L)    Glucose, Bld 144 (*) 70 - 99 (mg/dL)    BUN 61 (*) 6 - 23 (mg/dL)    Creatinine, Ser 0.96 (*) 0.50 - 1.35 (mg/dL)    Calcium 8.1 (*) 8.4 - 10.5 (mg/dL)  Phosphorus 4.5  2.3 - 4.6 (mg/dL)    Albumin 2.4 (*) 3.5 - 5.2 (g/dL)    GFR calc non Af Amer 30 (*) >90 (mL/min)    GFR calc Af Amer 34 (*) >90 (mL/min)   CBC     Status: Abnormal   Collection Time   06/11/11  5:00 AM      Component Value Range Comment   WBC 35.2 (*) 4.0 - 10.5 (K/uL)    RBC 3.23 (*) 4.22 - 5.81 (MIL/uL)    Hemoglobin 9.6 (*) 13.0 - 17.0 (g/dL)    HCT 16.1 (*) 09.6 - 52.0 (%)    MCV 90.4  78.0 - 100.0 (fL)    MCH 29.7  26.0 - 34.0 (pg)    MCHC 32.9  30.0 - 36.0 (g/dL)    RDW 04.5 (*) 40.9 - 15.5 (%)    Platelets 115 (*) 150 - 400 (K/uL)  CONSISTENT WITH PREVIOUS RESULT  MAGNESIUM     Status: Abnormal   Collection Time   06/11/11  5:00 AM      Component Value Range Comment   Magnesium 2.8 (*) 1.5 - 2.5 (mg/dL)   APTT     Status: Abnormal   Collection Time   06/11/11  5:00 AM      Component Value Range Comment   aPTT 160 (*) 24 - 37 (seconds)   GLUCOSE, CAPILLARY     Status: Abnormal   Collection Time   06/11/11  8:27 AM      Component Value Range Comment   Glucose-Capillary 141 (*) 70 - 99 (mg/dL)   GLUCOSE, CAPILLARY     Status: Abnormal   Collection Time   06/11/11 11:38 AM      Component Value Range Comment   Glucose-Capillary 164 (*) 70 - 99 (mg/dL)   POCT I-STAT 7, (EG7 V)     Status: Abnormal   Collection Time   06/11/11 12:21 PM      Component Value Range Comment   pH, Ven 7.287  7.250 - 7.300     pCO2, Ven 58.1 (*) 45.0 - 50.0 (mmHg)    pO2, Ven 40.0  30.0 - 45.0 (mmHg)    Bicarbonate 27.9 (*) 20.0 - 24.0 (mEq/L)    TCO2 30  0 - 100 (mmol/L)    O2 Saturation 69.0      Sodium 137  135 - 145 (mEq/L)    Potassium 4.3  3.5 - 5.1 (mEq/L)    Calcium, Ion 0.62 (*) 1.12 - 1.32 (mmol/L)    HCT 34.0 (*) 39.0 - 52.0 (%)    Hemoglobin 11.6 (*) 13.0 - 17.0 (g/dL)    Patient temperature 97.6 F      Collection site ARTERIAL LINE      Drawn by Operator      Sample type MIXED VENOUS SAMPLE     POCT I-STAT 7, (EG7 V)     Status: Abnormal   Collection Time   06/11/11 12:31 PM      Component Value Range Comment   pH, Ven 7.309 (*) 7.250 - 7.300     pCO2, Ven 55.4 (*) 45.0 - 50.0 (mmHg)    pO2, Ven 41.0  30.0 - 45.0 (mmHg)    Bicarbonate 28.0 (*) 20.0 - 24.0 (mEq/L)    TCO2 30  0 - 100 (mmol/L)    O2 Saturation 71.0      Acid-Base Excess 1.0  0.0 - 2.0 (mmol/L)    Sodium 135  135 - 145 (mEq/L)  Potassium 4.4  3.5 - 5.1 (mEq/L)    Calcium, Ion 1.10 (*) 1.12 - 1.32 (mmol/L)    HCT 32.0 (*) 39.0 - 52.0 (%)    Hemoglobin 10.9 (*) 13.0 - 17.0 (g/dL)    Patient temperature 97.6 F      Collection site ARTERIAL LINE       Drawn by Operator      Sample type VENOUS     RENAL FUNCTION PANEL     Status: Abnormal   Collection Time   06/11/11  2:10 PM      Component Value Range Comment   Sodium 134 (*) 135 - 145 (mEq/L)    Potassium 4.4  3.5 - 5.1 (mEq/L)    Chloride 98  96 - 112 (mEq/L)    CO2 27  19 - 32 (mEq/L)    Glucose, Bld 173 (*) 70 - 99 (mg/dL)    BUN 54 (*) 6 - 23 (mg/dL)    Creatinine, Ser 1.61 (*) 0.50 - 1.35 (mg/dL)    Calcium 8.7  8.4 - 10.5 (mg/dL)    Phosphorus 4.2  2.3 - 4.6 (mg/dL)    Albumin 2.3 (*) 3.5 - 5.2 (g/dL)    GFR calc non Af Amer 33 (*) >90 (mL/min)    GFR calc Af Amer 39 (*) >90 (mL/min)   POCT I-STAT 7, (EG7 V)     Status: Abnormal   Collection Time   06/11/11  2:22 PM      Component Value Range Comment   pH, Ven 7.327 (*) 7.250 - 7.300     pCO2, Ven 54.4 (*) 45.0 - 50.0 (mmHg)    pO2, Ven 39.0  30.0 - 45.0 (mmHg)    Bicarbonate 28.6 (*) 20.0 - 24.0 (mEq/L)    TCO2 30  0 - 100 (mmol/L)    O2 Saturation 70.0      Acid-Base Excess 2.0  0.0 - 2.0 (mmol/L)    Sodium 136  135 - 145 (mEq/L)    Potassium 4.4  3.5 - 5.1 (mEq/L)    Calcium, Ion 1.12  1.12 - 1.32 (mmol/L)    HCT 32.0 (*) 39.0 - 52.0 (%)    Hemoglobin 10.9 (*) 13.0 - 17.0 (g/dL)    Patient temperature 97.6 F      Collection site ARTERIAL LINE      Drawn by Operator      Sample type VENOUS     CBC     Status: Abnormal   Collection Time   06/11/11  4:50 PM      Component Value Range Comment   WBC 32.0 (*) 4.0 - 10.5 (K/uL)    RBC 3.13 (*) 4.22 - 5.81 (MIL/uL)    Hemoglobin 9.2 (*) 13.0 - 17.0 (g/dL)    HCT 09.6 (*) 04.5 - 52.0 (%)    MCV 89.1  78.0 - 100.0 (fL)    MCH 29.4  26.0 - 34.0 (pg)    MCHC 33.0  30.0 - 36.0 (g/dL)    RDW 40.9 (*) 81.1 - 15.5 (%)    Platelets 102 (*) 150 - 400 (K/uL) CONSISTENT WITH PREVIOUS RESULT  GLUCOSE, CAPILLARY     Status: Abnormal   Collection Time   06/11/11  4:55 PM      Component Value Range Comment   Glucose-Capillary 159 (*) 70 - 99 (mg/dL)   POCT I-STAT 7, (EG7  V)     Status: Abnormal   Collection Time   06/11/11  7:15 PM  Component Value Range Comment   pH, Ven 7.348 (*) 7.250 - 7.300     pCO2, Ven 54.4 (*) 45.0 - 50.0 (mmHg)    pO2, Ven 40.0  30.0 - 45.0 (mmHg)    Bicarbonate 30.1 (*) 20.0 - 24.0 (mEq/L)    TCO2 32  0 - 100 (mmol/L)    O2 Saturation 73.0      Acid-Base Excess 3.0 (*) 0.0 - 2.0 (mmol/L)    Sodium 134 (*) 135 - 145 (mEq/L)    Potassium 4.3  3.5 - 5.1 (mEq/L)    Calcium, Ion 1.12  1.12 - 1.32 (mmol/L)    HCT 30.0 (*) 39.0 - 52.0 (%)    Hemoglobin 10.2 (*) 13.0 - 17.0 (g/dL)    Patient temperature 97.5 F      Collection site ARTERIAL LINE      Drawn by Operator      Sample type VENOUS     GLUCOSE, CAPILLARY     Status: Abnormal   Collection Time   06/11/11  8:09 PM      Component Value Range Comment   Glucose-Capillary 153 (*) 70 - 99 (mg/dL)   RENAL FUNCTION PANEL     Status: Abnormal   Collection Time   06/11/11 10:00 PM      Component Value Range Comment   Sodium 133 (*) 135 - 145 (mEq/L)    Potassium 4.3  3.5 - 5.1 (mEq/L)    Chloride 95 (*) 96 - 112 (mEq/L)    CO2 29  19 - 32 (mEq/L)    Glucose, Bld 145 (*) 70 - 99 (mg/dL)    BUN 46 (*) 6 - 23 (mg/dL)    Creatinine, Ser 1.61 (*) 0.50 - 1.35 (mg/dL)    Calcium 9.1  8.4 - 10.5 (mg/dL)    Phosphorus 3.7  2.3 - 4.6 (mg/dL)    Albumin 2.1 (*) 3.5 - 5.2 (g/dL)    GFR calc non Af Amer 38 (*) >90 (mL/min)    GFR calc Af Amer 44 (*) >90 (mL/min)   POCT I-STAT 7, (EG7 V)     Status: Abnormal   Collection Time   06/11/11 11:10 PM      Component Value Range Comment   pH, Ven 7.319 (*) 7.250 - 7.300     pCO2, Ven 58.9 (*) 45.0 - 50.0 (mmHg)    pO2, Ven 40.0  30.0 - 45.0 (mmHg)    Bicarbonate 30.3 (*) 20.0 - 24.0 (mEq/L)    TCO2 32  0 - 100 (mmol/L)    O2 Saturation 69.0      Acid-Base Excess 3.0 (*) 0.0 - 2.0 (mmol/L)    Sodium 137  135 - 145 (mEq/L)    Potassium 4.0  3.5 - 5.1 (mEq/L)    Calcium, Ion 0.55 (*) 1.12 - 1.32 (mmol/L)    HCT 32.0 (*) 39.0 - 52.0 (%)     Hemoglobin 10.9 (*) 13.0 - 17.0 (g/dL)    Sample type VENOUS      Comment NOTIFIED PHYSICIAN     POCT I-STAT 7, (EG7 V)     Status: Abnormal   Collection Time   06/11/11 11:15 PM      Component Value Range Comment   pH, Ven 7.347 (*) 7.250 - 7.300     pCO2, Ven 58.1 (*) 45.0 - 50.0 (mmHg)    pO2, Ven 37.0  30.0 - 45.0 (mmHg)    Bicarbonate 31.8 (*) 20.0 - 24.0 (mEq/L)    TCO2 34  0 - 100 (mmol/L)    O2 Saturation 66.0      Acid-Base Excess 5.0 (*) 0.0 - 2.0 (mmol/L)    Sodium 135  135 - 145 (mEq/L)    Potassium 4.3  3.5 - 5.1 (mEq/L)    Calcium, Ion 1.15  1.12 - 1.32 (mmol/L)    HCT 30.0 (*) 39.0 - 52.0 (%)    Hemoglobin 10.2 (*) 13.0 - 17.0 (g/dL)    Sample type VENOUS     GLUCOSE, CAPILLARY     Status: Abnormal   Collection Time   06/11/11 11:57 PM      Component Value Range Comment   Glucose-Capillary 128 (*) 70 - 99 (mg/dL)    Comment 1 Documented in Chart      Comment 2 Notify RN     MAGNESIUM     Status: Normal   Collection Time   06/12/11  3:45 AM      Component Value Range Comment   Magnesium 2.4  1.5 - 2.5 (mg/dL)   CBC     Status: Abnormal   Collection Time   06/12/11  3:45 AM      Component Value Range Comment   WBC 30.3 (*) 4.0 - 10.5 (K/uL)    RBC 3.09 (*) 4.22 - 5.81 (MIL/uL)    Hemoglobin 9.2 (*) 13.0 - 17.0 (g/dL)    HCT 16.1 (*) 09.6 - 52.0 (%)    MCV 89.6  78.0 - 100.0 (fL)    MCH 29.8  26.0 - 34.0 (pg)    MCHC 33.2  30.0 - 36.0 (g/dL)    RDW 04.5 (*) 40.9 - 15.5 (%)    Platelets 89 (*) 150 - 400 (K/uL) CONSISTENT WITH PREVIOUS RESULT  RENAL FUNCTION PANEL     Status: Abnormal   Collection Time   06/12/11  3:45 AM      Component Value Range Comment   Sodium 133 (*) 135 - 145 (mEq/L)    Potassium 4.2  3.5 - 5.1 (mEq/L)    Chloride 93 (*) 96 - 112 (mEq/L)    CO2 30  19 - 32 (mEq/L)    Glucose, Bld 138 (*) 70 - 99 (mg/dL)    BUN 43 (*) 6 - 23 (mg/dL)    Creatinine, Ser 8.11 (*) 0.50 - 1.35 (mg/dL)    Calcium 9.8  8.4 - 10.5 (mg/dL)    Phosphorus  3.4  2.3 - 4.6 (mg/dL)    Albumin 2.3 (*) 3.5 - 5.2 (g/dL)    GFR calc non Af Amer 40 (*) >90 (mL/min)    GFR calc Af Amer 47 (*) >90 (mL/min)   GLUCOSE, CAPILLARY     Status: Abnormal   Collection Time   06/12/11  3:58 AM      Component Value Range Comment   Glucose-Capillary 142 (*) 70 - 99 (mg/dL)    Comment 1 Documented in Chart      Comment 2 Notify RN     GLUCOSE, CAPILLARY     Status: Abnormal   Collection Time   06/12/11  8:12 AM      Component Value Range Comment   Glucose-Capillary 170 (*) 70 - 99 (mg/dL)     Dg Abd 1 View  10/11/7827  *RADIOLOGY REPORT*  Clinical Data: Panda tube placement under fluoroscopy.  ABDOMEN - 1 VIEW  Comparison: No priors.  Findings: Single fluoroscopic image after placement of tube and injection of the contrast material demonstrates the tip of the feeding tube in the distal  duodenum.  IMPRESSION: 1.  Tip of the feeding tube is in the distal duodenum.  Original Report Authenticated By: Florencia Reasons, M.D.   Dg Chest Port 1 View  06/11/2011  *RADIOLOGY REPORT*  Clinical Data: Intubated patient.  PORTABLE CHEST - 1 VIEW  Comparison: Chest 06/09/2011 and 06/10/2011.  Findings: Support tubes and lines are unchanged in good position. Cardiomegaly again noted.  Right effusion and basilar airspace disease show some improvement.  Dense opacity left lung base is unchanged.  IMPRESSION: Improved right effusion and basilar airspace disease.  No other change.  Original Report Authenticated By: Bernadene Bell. D'ALESSIO, M.D.   Dg Naso G Tube Plc W/fl-no Rad  06/11/2011  CLINICAL DATA: Panda tube placement in post pyloric position   NASO G TUBE PLACEMENT WITH FLUORO  Fluoroscopy was utilized by the requesting physician.  No radiographic  interpretation.      ROS not obtainable. Blood pressure 116/48, pulse 135, temperature 97.6 F (36.4 C), temperature source Oral, resp. rate 16, height 5\' 11"  (1.803 m), weight 313 lb 7.9 oz (142.2 kg), SpO2 99.00%. Presently on  levophed, milrinone, and vasopressin drips. With [employer] on vent. Arouses to voice and will squeeze my fingers JVP unable to see. No carotid bruits heard. Chest clear Heart rapid irregular rate. Prominent s3 Abdomen: soft.  Obese No apparent tenderness. Extremities: 4+ marked chronic pitting edema. weeping  Tele: Probable afl/atach with frequent PVCs/bigeminy  Assessment: 1. Shock , multifactorial - septic/cardiogenic       --BAL + parainfluenza 2. Atrial flutter with rapid ventricular response of uncertain duration 3. Cardiomyopathy ? Etiology.  There are regional wall motion abnormalities with severe depression of LV systolic function EF 15-20%.  This could be from chronic ischemic heart disease but no history of angina etc. Tachycardia-induced cardiomyopathy also a possibility. 4. Mildly elevated troponin consistent with enzyme leak secondary to tachycardia and shock rather than a primary cardiac event (MI). 5. PNA 6. VDRF 7. Acute on chronic renal failure s/p renal transplant  Rec:  He remains critically ill but seems to be improving. Remains in AFL with rapid rates despite amio. I am reluctant to give lopressor as he remains pressor dependent. I discussed possibility of TEE/DC-CV with Dr. Sung Amabile to see if this would help his progress. Given that he is off of heparin we will defer at this point but can keep in the back of our heads as I think there is a chance he may maintain NSR on amio as his overall condition improves. If able to wean neo could consider lopressor 2.5 IV q6hr as BP tolerates.  Will continue to follow closely.    Maxwell Martorano 06/12/2011, 10:23 AM

## 2011-06-12 NOTE — Progress Notes (Signed)
ASSName: Chad [name] MRN: [id number] DOB: 14-Mar-1955    LOS: 9  PCCM  NOTE  History of Present Illness: [name] s/p renal Tx, immune suppressed, adm 5/08 to [facility] service with 3 days of cough, shortness of breath and fever. Progressive resp failure resulting in intubation 5/8.   Lines / Drains: R IJ HD cath 5/12 >> 5/13 (inadvertent removal) Aline 5/8>>>out R IJ HD cath 5/13 >>  Left IJ 5/8>>> ETT 5/8>>>   Cultures: BAL bronch 5/8>>>ng    AFB >> neg   Viral panel >> POS parainfluenza virus   Pneumocystis >> neg   Fungus >> neg   HSV >> neg BC 5/7>>>neg  Blood X 2 5/12 >>   Antibiotics: 5/7 levoflox>>>5/10 5/7 bactrim >>>5/10 5/7 micafungin >> 5/14 5/7 vanc >>  5/10 cefepime >> 5/15 Ribaviran/IVIG >>   Tests / Events: 5/8 hypoxic resp failure, intubated, near arrest 5/8 FOB - diffuse airway edema, diffuse bloody mucoid secretions 5/11 worsening shock , on sepsis protocol + dobut for low co-ox 5/12 Echocardiogram:  LVEF 15-20%. Diffuse HK 5/15 Family mtg: continue full aggressive support but NCB if arrests. To reassess in 4-5 days. If no progress, consider withdrawal at that time 5/15 Trial of Ribaviran/IVIG 5/16 Markedly improved - reduced pressors, improved cognition, gas exchange improved   Subj:  RASS -1, + F/C. Decreasing pressor reqts  Vital Signs: Temp:  [97.4 F (36.3 C)-98.5 F (36.9 C)] 98.5 F (36.9 C) (05/16 1158) Pulse Rate:  [104-136] 134  (05/16 1230) Resp:  [12-26] 17  (05/16 1230) BP: (88-141)/(42-121) 130/68 mmHg (05/16 1219) SpO2:  [97 %-100 %] 100 % (05/16 1230) FiO2 (%):  [40 %-50 %] 40 % (05/16 1219) Weight:  [142.2 kg (313 lb 7.9 oz)] 142.2 kg (313 lb 7.9 oz) (05/16 0500) I/O last 3 completed shifts: In: 6567.3 [I.V.:5357.3; Other:20; NG/GT:510; IV Piggyback:680] Out: 7310 [Urine:10; Emesis/NG output:695; Other:6605]  Physical Examination: General: Calm, RASS 0, + F/C Neuro:  Nonfocal HEENT:  Line clean Neck:  jvd  increased Cardiovascular:  Mild tachy, irregularly irregular, no murmurs heard Lungs:  Clear anteriorly Abdomen:  Soft, bs wnl no r/g Lower ext- severe chronic edema  Ventilator settings: Vent Mode:  [-] PRVC FiO2 (%):  [40 %-50 %] 40 % Set Rate:  [16 bmp] 16 bmp Vt Set:  [450 mL] 450 mL PEEP:  [5 cmH20-10 cmH20] 5 cmH20 Plateau Pressure:  [7 cmH20-22 cmH20] 22 cmH20  Labs and Imaging:   CXR: no new film CBC    Component Value Date/Time   WBC 30.3* 06/12/2011 0345   RBC 3.09* 06/12/2011 0345   HGB 9.2* 06/12/2011 0345   HCT 27.7* 06/12/2011 0345   PLT 89* 06/12/2011 0345   MCV 89.6 06/12/2011 0345   MCH 29.8 06/12/2011 0345   MCHC 33.2 06/12/2011 0345   RDW 17.2* 06/12/2011 0345   LYMPHSABS 1.0 06/07/2011 2105   MONOABS 0.3 06/07/2011 2105   EOSABS 0.0 06/07/2011 2105   BASOSABS 0.0 06/07/2011 2105    BMET    Component Value Date/Time   NA 133* 06/12/2011 0345   K 4.2 06/12/2011 0345   CL 93* 06/12/2011 0345   CO2 30 06/12/2011 0345   GLUCOSE 138* 06/12/2011 0345   BUN 43* 06/12/2011 0345   CREATININE 1.83* 06/12/2011 0345   CALCIUM 9.8 06/12/2011 0345   GFRNONAA 40* 06/12/2011 0345   GFRAA 47* 06/12/2011 0345     Assessment and Plan: 1. Pulmonary  - Acute respiratory failure  PNA, immunosuppressed Plan - - Cont vent support. Begin trials of PSV weaning as tolerated - Cont abx as per ID - Recheck resp virus panel  2. CVS Cardiomyopathy of unclear etiology - suspect viral, Shock - cardiogenic and/or septic, PSVT/A fib/flutter - rate control improved on phenylephrine - Amiodarone started per cards.  - Wean pressors for MAP > 60 mmHg - Cont stress dose steroids - dose decreased 5/15 to 50 q 8 hrs  3. Renal Renal TX, ATN, acidosis & mild hyperkalemia - Cont CVVH per Renal  4. ID PNA Immunocompromised host Empiric HCAP   - abx Per ID Discussed at length with ID 5/15. 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 Will  recheck resp virus panel and begin these therapies  4. Heme Leukocytosis, thrombocytopenia  - UGI bleeding from OGT improved- cont off all heparins - SCDs for DVT prophy  5. Endo - Cont CBGs, SSI  6. MIld agitation - Cont sedation protocol - goal RASS 0 to -1 - Cont daily WUA  7. GI/ Nutrition - not tolerating gastric feedings. - Tolerating TFs via post pyloric panda tube   8. H/o Hep C - monitor LFTs periodically    Best practices / Disposition: -->ICU status under PCCM -->full Rx but NCB if suffers cardiac arrest -->SCDs -->Protonix for GI Px -->ventilator bundle -->TFs  Overall appears markedly improved. Will update family when they arrive. Discussed with Dr Daiva Eves and Dr [name]  35 mins CCM time  Billy Fischer, MD;  PCCM service; Mobile [phone]

## 2011-06-12 NOTE — Progress Notes (Signed)
Utilization Review Completed.Dorcas Carrow T5/16/2013

## 2011-06-13 ENCOUNTER — Inpatient Hospital Stay (HOSPITAL_COMMUNITY): Payer: BC Managed Care – PPO

## 2011-06-13 DIAGNOSIS — N179 Acute kidney failure, unspecified: Secondary | ICD-10-CM

## 2011-06-13 LAB — POCT I-STAT EG7
Acid-Base Excess: 10 mmol/L — ABNORMAL HIGH (ref 0.0–2.0)
Acid-Base Excess: 9 mmol/L — ABNORMAL HIGH (ref 0.0–2.0)
Acid-Base Excess: 6 mmol/L — ABNORMAL HIGH (ref 0.0–2.0)
Bicarbonate: 32.4 mEq/L — ABNORMAL HIGH (ref 20.0–24.0)
Bicarbonate: 35.7 mEq/L — ABNORMAL HIGH (ref 20.0–24.0)
Bicarbonate: 36.4 mEq/L — ABNORMAL HIGH (ref 20.0–24.0)
Calcium, Ion: 1.16 mmol/L (ref 1.12–1.32)
Calcium, Ion: 0.55 mmol/L — CL (ref 1.12–1.32)
Calcium, Ion: 0.6 mmol/L — CL (ref 1.12–1.32)
HCT: 29 % — ABNORMAL LOW (ref 39.0–52.0)
HCT: 33 % — ABNORMAL LOW (ref 39.0–52.0)
HCT: 35 % — ABNORMAL LOW (ref 39.0–52.0)
Hemoglobin: 10.9 g/dL — ABNORMAL LOW (ref 13.0–17.0)
Hemoglobin: 11.2 g/dL — ABNORMAL LOW (ref 13.0–17.0)
Hemoglobin: 9.9 g/dL — ABNORMAL LOW (ref 13.0–17.0)
O2 Saturation: 54 %
O2 Saturation: 64 %
O2 Saturation: 57 %
Potassium: 3.4 mEq/L — ABNORMAL LOW (ref 3.5–5.1)
Potassium: 3.6 mEq/L (ref 3.5–5.1)
Potassium: 3.4 mEq/L — ABNORMAL LOW (ref 3.5–5.1)
Sodium: 137 mEq/L (ref 135–145)
Sodium: 137 mEq/L (ref 135–145)
Sodium: 138 mEq/L (ref 135–145)
Sodium: 139 mEq/L (ref 135–145)
TCO2: 36 mmol/L (ref 0–100)
TCO2: 39 mmol/L (ref 0–100)
pCO2, Ven: 57.9 mmHg — ABNORMAL HIGH (ref 45.0–50.0)
pH, Ven: 7.374 — ABNORMAL HIGH (ref 7.250–7.300)
pH, Ven: 7.38 — ABNORMAL HIGH (ref 7.250–7.300)
pH, Ven: 7.423 — ABNORMAL HIGH (ref 7.250–7.300)
pH, Ven: 7.449 — ABNORMAL HIGH (ref 7.250–7.300)
pO2, Ven: 29 mmHg — CL (ref 30.0–45.0)
pO2, Ven: 36 mmHg (ref 30.0–45.0)
pO2, Ven: 39 mmHg (ref 30.0–45.0)

## 2011-06-13 LAB — RENAL FUNCTION PANEL
Albumin: 2.1 g/dL — ABNORMAL LOW (ref 3.5–5.2)
Albumin: 2.2 g/dL — ABNORMAL LOW (ref 3.5–5.2)
BUN: 27 mg/dL — ABNORMAL HIGH (ref 6–23)
BUN: 30 mg/dL — ABNORMAL HIGH (ref 6–23)
CO2: 34 mEq/L — ABNORMAL HIGH (ref 19–32)
Calcium: 10.2 mg/dL (ref 8.4–10.5)
Calcium: 10 mg/dL (ref 8.4–10.5)
Chloride: 91 mEq/L — ABNORMAL LOW (ref 96–112)
Creatinine, Ser: 1.57 mg/dL — ABNORMAL HIGH (ref 0.50–1.35)
Creatinine, Ser: 1.38 mg/dL — ABNORMAL HIGH (ref 0.50–1.35)
Creatinine, Ser: 1.49 mg/dL — ABNORMAL HIGH (ref 0.50–1.35)
GFR calc non Af Amer: 48 mL/min — ABNORMAL LOW (ref >90)
Glucose, Bld: 143 mg/dL — ABNORMAL HIGH (ref 70–99)
Phosphorus: 2.4 mg/dL (ref 2.3–4.6)
Phosphorus: 1.4 mg/dL — ABNORMAL LOW (ref 2.3–4.6)
Potassium: 3.2 mEq/L — ABNORMAL LOW (ref 3.5–5.1)
Sodium: 135 mEq/L (ref 135–145)

## 2011-06-13 LAB — POCT I-STAT 3, ART BLOOD GAS (G3+)
Acid-Base Excess: 14 mmol/L — ABNORMAL HIGH (ref 0.0–2.0)
Acid-Base Excess: 15 mmol/L — ABNORMAL HIGH (ref 0.0–2.0)
Acid-Base Excess: 17 mmol/L — ABNORMAL HIGH (ref 0.0–2.0)
Bicarbonate: 38.7 mEq/L — ABNORMAL HIGH (ref 20.0–24.0)
Bicarbonate: 38.9 mEq/L — ABNORMAL HIGH (ref 20.0–24.0)
Bicarbonate: 39.9 mEq/L — ABNORMAL HIGH (ref 20.0–24.0)
O2 Saturation: 60 %
O2 Saturation: 71 %
Patient temperature: 98
TCO2: 40 mmol/L (ref 0–100)
TCO2: 41 mmol/L (ref 0–100)
pCO2 arterial: 43.5 mmHg (ref 35.0–45.0)
pCO2 arterial: 47 mmHg — ABNORMAL HIGH (ref 35.0–45.0)
pCO2 arterial: 53 mmHg — ABNORMAL HIGH (ref 35.0–45.0)
pH, Arterial: 7.558 — ABNORMAL HIGH (ref 7.350–7.450)
pH, Arterial: 7.506 — ABNORMAL HIGH (ref 7.350–7.450)
pO2, Arterial: 28 mmHg — CL (ref 80.0–100.0)
pO2, Arterial: 32 mmHg — CL (ref 80.0–100.0)
pO2, Arterial: 34 mmHg — CL (ref 80.0–100.0)
pO2, Arterial: 228 mmHg — ABNORMAL HIGH (ref 80.0–100.0)

## 2011-06-13 LAB — CBC
HCT: 28.5 % — ABNORMAL LOW (ref 39.0–52.0)
HCT: 30.3 % — ABNORMAL LOW (ref 39.0–52.0)
Hemoglobin: 9.2 g/dL — ABNORMAL LOW (ref 13.0–17.0)
MCV: 92.5 fL (ref 78.0–100.0)
MCV: 94.7 fL (ref 78.0–100.0)
RBC: 3.2 MIL/uL — ABNORMAL LOW (ref 4.22–5.81)
RDW: 18.6 % — ABNORMAL HIGH (ref 11.5–15.5)
WBC: 23.2 10*3/uL — ABNORMAL HIGH (ref 4.0–10.5)
WBC: 25.1 10*3/uL — ABNORMAL HIGH (ref 4.0–10.5)

## 2011-06-13 LAB — RESPIRATORY VIRUS PANEL
Adenovirus B: NOT DETECTED
CoronavirusHKU1: NOT DETECTED
Coxsackie and Echovirus: NOT DETECTED
Metapneumovirus: NOT DETECTED
Parainfluenza 3: DETECTED — AB
Parainfluenza 4: NOT DETECTED
Respiratory Syncytial Virus A: NOT DETECTED
Rhinovirus: NOT DETECTED

## 2011-06-13 LAB — GLUCOSE, CAPILLARY
Glucose-Capillary: 159 mg/dL — ABNORMAL HIGH (ref 70–99)
Glucose-Capillary: 155 mg/dL — ABNORMAL HIGH (ref 70–99)
Glucose-Capillary: 148 mg/dL — ABNORMAL HIGH (ref 70–99)
Glucose-Capillary: 117 mg/dL — ABNORMAL HIGH (ref 70–99)

## 2011-06-13 LAB — CARBOXYHEMOGLOBIN: Methemoglobin: 0.5 % (ref 0.0–1.5)

## 2011-06-13 LAB — CALCIUM, IONIZED: Calcium, Ion: 1.16 mmol/L (ref 1.12–1.32)

## 2011-06-13 MED ORDER — METOPROLOL TARTRATE 1 MG/ML IV SOLN
2.5000 mg | INTRAVENOUS | Status: DC | PRN
  Administered 2011-06-13 – 2011-06-16 (×3): 2.5 mg via INTRAVENOUS
  Filled 2011-06-13 (×2): qty 5

## 2011-06-13 MED ORDER — FENTANYL BOLUS VIA INFUSION
50.0000 ug | Freq: Four times a day (QID) | INTRAVENOUS | Status: DC | PRN
  Administered 2011-06-13: 50 ug via INTRAVENOUS
  Administered 2011-06-16 (×2): 100 ug via INTRAVENOUS
  Filled 2011-06-13: qty 100

## 2011-06-13 MED ORDER — METRONIDAZOLE IN NACL 5-0.79 MG/ML-% IV SOLN
500.0000 mg | Freq: Three times a day (TID) | INTRAVENOUS | Status: DC
  Filled 2011-06-13 (×3): qty 100

## 2011-06-13 MED ORDER — MIDAZOLAM HCL 2 MG/2ML IJ SOLN
INTRAMUSCULAR | Status: AC
  Administered 2011-06-13: 2 mg
  Filled 2011-06-13: qty 2

## 2011-06-13 MED ORDER — HYDROCORTISONE SOD SUCCINATE 100 MG IJ SOLR
50.0000 mg | Freq: Two times a day (BID) | INTRAMUSCULAR | Status: DC
  Administered 2011-06-13 – 2011-06-14 (×3): 50 mg via INTRAVENOUS
  Filled 2011-06-13 (×6): qty 1

## 2011-06-13 MED ORDER — VECURONIUM BROMIDE 10 MG IV SOLR
INTRAVENOUS | Status: AC
  Administered 2011-06-13: 10 mg
  Filled 2011-06-13: qty 10

## 2011-06-13 MED ORDER — VANCOMYCIN 50 MG/ML ORAL SOLUTION
500.0000 mg | Freq: Four times a day (QID) | ORAL | Status: DC
  Filled 2011-06-13 (×4): qty 10

## 2011-06-13 MED ORDER — METOPROLOL TARTRATE 1 MG/ML IV SOLN
INTRAVENOUS | Status: AC
  Administered 2011-06-13: 2.5 mg via INTRAVENOUS
  Filled 2011-06-13: qty 5

## 2011-06-13 MED ORDER — MIDAZOLAM HCL 2 MG/2ML IJ SOLN
INTRAMUSCULAR | Status: AC
  Filled 2011-06-13: qty 4

## 2011-06-13 MED ORDER — ETOMIDATE 2 MG/ML IV SOLN
INTRAVENOUS | Status: AC
  Administered 2011-06-13: 20 mg
  Filled 2011-06-13: qty 10

## 2011-06-13 MED ORDER — SODIUM CHLORIDE 0.9 % IV SOLN
50.0000 ug/h | INTRAVENOUS | Status: DC
  Administered 2011-06-13: 50 ug/h via INTRAVENOUS
  Administered 2011-06-14 – 2011-06-15 (×2): 100 ug/h via INTRAVENOUS
  Filled 2011-06-13 (×2): qty 50

## 2011-06-13 MED ORDER — FENTANYL CITRATE 0.05 MG/ML IJ SOLN: 12.5000 ug | INTRAMUSCULAR | Status: DC | PRN

## 2011-06-13 NOTE — Progress Notes (Signed)
Physical Therapy Cancellation Note: order received pt extubated an hour ago. Will hold eval due to not yet 4hrs post extubation. Thanks Delaney Meigs, PT [phone]

## 2011-06-13 NOTE — Progress Notes (Signed)
Clinical Social Worker received referral from RN-Chris.  CSW was informed that pt might be in need of assistance with care planning.  Pt's current decision maker is his 55 year old son.  RN expressed concern that pt's sister has not returned phone calls.  Pt's son's mother would like to be involved.  Additionally, RN reports that pt has several friends who come to visit patient and has been given pt's "code word" by "various people".  CSW attempted to phone pt's son, whose phone was not turned on.  CSW phoned son's mother and expressed that MD and RN would like to speak with son regarding pt's plan of care.  Mother stated she would be able to come to the hospital "around 5 because Feliz Beam (son) is working".  CSW expressed the importance for son to make this visit a priority and identify ways to modify work schedule in able to be present at hospital for decision making.   Angelia Mould, MSW, [place] [phone]

## 2011-06-13 NOTE — Progress Notes (Signed)
ASSName: Chad [name] MRN: [id number] DOB: 1955-07-03    LOS: 10  PCCM  NOTE  History of Present Illness: [name] s/p renal Tx, immune suppressed, adm 5/08 to [facility] service with 3 days of cough, shortness of breath and fever. Progressive resp failure resulting in intubation 5/8.   Lines / Drains: R IJ HD cath 5/12 >> 5/13 (inadvertent removal) Aline 5/8>>>out R IJ HD cath 5/13 >>  Left IJ 5/8 >>  ETT 5/8 >>    Cultures: BAL bronch 5/8>>>ng    AFB >> neg   Viral panel >> POS parainfluenza virus   Pneumocystis >> neg   Fungus >> neg   HSV >> neg BC 5/7>>>neg  Blood X 2 5/12 >> neg  Antibiotics: (per ID) 5/7 levoflox>>>5/10 5/7 bactrim >>>5/10 5/7 micafungin >> 5/14 5/7 vanc >> 5/10 5/10 cefepime >> 5/15 5/15 Ribaviran/IVIG >>   Tests / Events: 5/8 hypoxic resp failure, intubated, near arrest 5/8 FOB - diffuse airway edema, diffuse bloody mucoid secretions 5/11 worsening shock , on sepsis protocol + dobut for low co-ox 5/12 Echocardiogram:  LVEF 15-20%. Diffuse HK 5/15 Family mtg: continue full aggressive support but NCB if arrests. To reassess in 4-5 days. If no progress, consider withdrawal at that time 5/15 Trial of Ribaviran/IVIG 5/16 Markedly improved - reduced pressors, improved cognition, gas exchange improved 5/17 continued improvement. Tolerates PS 5 cm H2O with Vt > 800cc  Subj:  RASS +1, + F/C. Off pressors this AM. Tachycardic  Vital Signs: Temp:  [97.7 F (36.5 C)-99.4 F (37.4 C)] 97.8 F (36.6 C) (05/17 0802) Pulse Rate:  [86-154] 142  (05/17 0930) Resp:  [13-26] 23  (05/17 0930) BP: (70-145)/(39-106) 145/82 mmHg (05/17 0930) SpO2:  [88 %-100 %] 88 % (05/17 0930) FiO2 (%):  [30 %-40 %] 30 % (05/17 0800) Weight:  [139.3 kg (307 lb 1.6 oz)] 139.3 kg (307 lb 1.6 oz) (05/17 0500) I/O last 3 completed shifts: In: 6707.3 [I.V.:5347.3; Other:20; NG/GT:1340] Out: [id number] [Urine:57; Emesis/NG output:320; WUJWJ:1914; Stool:1200]  Physical  Examination: General: RASS +1, + F/C Neuro:  Nonfocal HEENT:  Line clean Neck:  jvd increased Cardiovascular:  tachy, regular, no murmurs heard Lungs:  Clear anteriorly Abdomen:  Soft, bs wnl no r/g Lower ext- Anasarca  Ventilator settings: Vent Mode:  [-] PSV;CPAP FiO2 (%):  [30 %-40 %] 30 % Set Rate:  [16 bmp] 16 bmp Vt Set:  [450 mL] 450 mL PEEP:  [5 cmH20] 5 cmH20 Pressure Support:  [5 cmH20] 5 cmH20 Plateau Pressure:  [11 cmH20-16 cmH20] 16 cmH20  Labs and Imaging:   CXR: no new film CBC    Component Value Date/Time   WBC 23.2* 06/13/2011 0344   RBC 3.08* 06/13/2011 0344   HGB 10.9* 06/13/2011 0922   HCT 32.0* 06/13/2011 0922   PLT 81* 06/13/2011 0344   MCV 92.5 06/13/2011 0344   MCH 29.9 06/13/2011 0344   MCHC 32.3 06/13/2011 0344   RDW 18.0* 06/13/2011 0344   LYMPHSABS 1.0 06/07/2011 2105   MONOABS 0.3 06/07/2011 2105   EOSABS 0.0 06/07/2011 2105   BASOSABS 0.0 06/07/2011 2105    BMET    Component Value Date/Time   NA 137 06/13/2011 0922   K 3.4* 06/13/2011 0922   CL 90* 06/13/2011 0344   CO2 34* 06/13/2011 0344   GLUCOSE 170* 06/13/2011 0344   BUN 31* 06/13/2011 0344   CREATININE 1.57* 06/13/2011 0344   CALCIUM 10.2 06/13/2011 0344   GFRNONAA 48* 06/13/2011 0344  GFRAA 56* 06/13/2011 0344     Assessment and Plan: 1. Pulmonary  - Acute respiratory failure  PNA, immunosuppressed Tolerating PS 5 cm H2O with Vt > 800 cc on 5/17 Plan - - Continue SBT - he is very sick overall but his mechanics and gas exchange are favorable for extubation - Off abx as per ID - Recheck resp virus panel  2. CVS Cardiomyopathy of unclear etiology - suspect viral Shock - cardiogenic and/or septic - off pressors as of AM 5/17.   PSVT/A fib/flutter/sinus tachy - Amiodarone started per cards.  - Low dose PRM metoprolol for tachycardia (keep HR < 115/min) - Wean pressors for MAP > 60 mmHg - Cont stress dose steroids - dose decreased 5/17 to 50 q 12 hrs  3. Renal Renal TX, ATN, acidosis  & mild hyperkalemia - Cont CVVH per Renal - now off pressors. Hopefully can transition to intermittent HD soon  4. ID PNA Immunocompromised host Empiric HCAP   - abx Per ID - off antibacterials as of 5/15.  Discussed at length with ID 5/15. 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 Will recheck resp virus panel and begin these therapies  4. Heme Leukocytosis, thrombocytopenia - WBC improving. TCP stable 5/17 UGI bleeding from OGT resolved- cont off all heparins - SCDs for DVT prophy  5. Endo - Cont CBGs, SSI  6. MIld agitation - Suspect ETT is major contributor. Will entertain extubation today if tolrates SBT for 1-2 hrs - Cont daily WUA  7. GI/ Nutrition - not tolerating gastric feedings. - Tolerating TFs via post pyloric panda tube   8. H/o Hep C - monitor LFTs periodically    Best practices / Disposition: -->ICU status under PCCM -->full Rx but NCB if suffers cardiac arrest - would re-intubate if fails attempt at extubation -->SCDs -->Protonix for GI Px -->TFs  Overall continues  To improve markedly. Will update family when they arrive.    35 mins CCM time  Billy Fischer, MD;  PCCM service; Mobile [phone]

## 2011-06-13 NOTE — Progress Notes (Signed)
Reason for Consult:Cardiomyopathy Referring Physician: CCM  Chad [name] is an 56 y.o. male.   Chad [name] is a 56 year old man with past history of remote renal transplant and immunosuppression was admitted with worsening cough fever and dyspnea. Found to have PNA and newly reduced EF 15-20%.  Has been tachycardic since admission and EKG shows atrial flutter with rapid ventricular response.  Amiodarone started 5/13.  Extubated today. Remains groggy and stidorous. Off neo and milrinone.   Remains on CVVHD pulling 50cc/hr. HR remains 110-130 on amio.  Off heparin due to ? GIB and thrombocytopenia.   Past Medical History  Diagnosis Date  . Hypertension   . H/O kidney transplant     Past Surgical History  Procedure Date  . Nephrectomy transplanted organ      Medications:  Scheduled:    . antiseptic oral rinse  15 mL Mouth Rinse QID  . chlorhexidine  15 mL Mouth Rinse BID  . collagenase   Topical Daily  . darbepoetin (ARANESP) injection - DIALYSIS  100 mcg Intravenous Q Wed-HD  . feeding supplement (OXEPA)  1,000 mL Per Tube Q24H  . feeding supplement  60 mL Per Tube QID  . haloperidol lactate  5 mg Intravenous Once  . hydrocortisone sod succinate (SOLU-CORTEF) injection  50 mg Intravenous Q12H  . IMMUNE GLOBLULIN (HUMAN) IV  0.5 g/kg Intravenous QODAY  . insulin aspart  0-4 Units Subcutaneous Q4H  . vancomycin  500 mg Per Tube Q6H   And  . metronidazole  500 mg Intravenous Q8H  . midazolam      . midazolam      . midazolam      . mulitivitamin  5 mL Per Tube Daily  . pantoprazole sodium  40 mg Per Tube BID  . Ribavirin  400 mg Per Tube TID  . sirolimus  0.75 mg Oral Daily  . DISCONTD: heparin subcutaneous  5,000 Units Subcutaneous Q8H  . DISCONTD: hydrocortisone sod succinate (SOLU-CORTEF) injection  50 mg Intravenous Q8H    Results for orders placed during the hospital encounter of 06/03/11 (from the past 48 hour(s))  POCT I-STAT 7, (EG7 V)     Status: Abnormal     Collection Time   06/11/11  4:45 PM      Component Value Range Comment   pH, Ven 7.332 (*) 7.250 - 7.300     pCO2, Ven 52.2 (*) 45.0 - 50.0 (mmHg)    pO2, Ven 35.0  30.0 - 45.0 (mmHg)    Bicarbonate 27.8 (*) 20.0 - 24.0 (mEq/L)    TCO2 29  0 - 100 (mmol/L)    O2 Saturation 64.0      Acid-Base Excess 1.0  0.0 - 2.0 (mmol/L)    Sodium 138  135 - 145 (mEq/L)    Potassium 4.2  3.5 - 5.1 (mEq/L)    Calcium, Ion 0.56 (*) 1.12 - 1.32 (mmol/L)    HCT 35.0 (*) 39.0 - 52.0 (%)    Hemoglobin 11.9 (*) 13.0 - 17.0 (g/dL)    Patient temperature 97.4 F      Collection site ARTERIAL LINE      Drawn by Operator      Sample type MIXED VENOUS SAMPLE      Comment VALUES EXPECTED, NO REPEAT     CBC     Status: Abnormal   Collection Time   06/11/11  4:50 PM      Component Value Range Comment   WBC 32.0 (*) 4.0 - 10.5 (  K/uL)    RBC 3.13 (*) 4.22 - 5.81 (MIL/uL)    Hemoglobin 9.2 (*) 13.0 - 17.0 (g/dL)    HCT 95.6 (*) 21.3 - 52.0 (%)    MCV 89.1  78.0 - 100.0 (fL)    MCH 29.4  26.0 - 34.0 (pg)    MCHC 33.0  30.0 - 36.0 (g/dL)    RDW 08.6 (*) 57.8 - 15.5 (%)    Platelets 102 (*) 150 - 400 (K/uL) CONSISTENT WITH PREVIOUS RESULT  GLUCOSE, CAPILLARY     Status: Abnormal   Collection Time   06/11/11  4:55 PM      Component Value Range Comment   Glucose-Capillary 159 (*) 70 - 99 (mg/dL)   POCT I-STAT 7, (EG7 V)     Status: Abnormal   Collection Time   06/11/11  4:58 PM      Component Value Range Comment   pH, Ven 7.343 (*) 7.250 - 7.300     pCO2, Ven 53.6 (*) 45.0 - 50.0 (mmHg)    pO2, Ven 37.0  30.0 - 45.0 (mmHg)    Bicarbonate 29.4 (*) 20.0 - 24.0 (mEq/L)    TCO2 31  0 - 100 (mmol/L)    O2 Saturation 69.0      Acid-Base Excess 3.0 (*) 0.0 - 2.0 (mmol/L)    Sodium 134 (*) 135 - 145 (mEq/L)    Potassium 4.4  3.5 - 5.1 (mEq/L)    Calcium, Ion 1.10 (*) 1.12 - 1.32 (mmol/L)    HCT 32.0 (*) 39.0 - 52.0 (%)    Hemoglobin 10.9 (*) 13.0 - 17.0 (g/dL)    Patient temperature 97.4 F      Collection  site ARTERIAL LINE      Drawn by Operator      Sample type VENOUS     POCT I-STAT 7, (EG7 V)     Status: Abnormal   Collection Time   06/11/11  7:05 PM      Component Value Range Comment   pH, Ven 7.311 (*) 7.250 - 7.300     pCO2, Ven 56.3 (*) 45.0 - 50.0 (mmHg)    pO2, Ven 38.0  30.0 - 45.0 (mmHg)    Bicarbonate 28.6 (*) 20.0 - 24.0 (mEq/L)    TCO2 30  0 - 100 (mmol/L)    O2 Saturation 68.0      Acid-Base Excess 1.0  0.0 - 2.0 (mmol/L)    Sodium 137  135 - 145 (mEq/L)    Potassium 4.1  3.5 - 5.1 (mEq/L)    Calcium, Ion 0.66 (*) 1.12 - 1.32 (mmol/L)    HCT 33.0 (*) 39.0 - 52.0 (%)    Hemoglobin 11.2 (*) 13.0 - 17.0 (g/dL)    Patient temperature 97.5 F      Collection site ARTERIAL LINE      Drawn by Operator      Sample type MIXED VENOUS SAMPLE      Comment NOTIFIED PHYSICIAN     POCT I-STAT 7, (EG7 V)     Status: Abnormal   Collection Time   06/11/11  7:15 PM      Component Value Range Comment   pH, Ven 7.348 (*) 7.250 - 7.300     pCO2, Ven 54.4 (*) 45.0 - 50.0 (mmHg)    pO2, Ven 40.0  30.0 - 45.0 (mmHg)    Bicarbonate 30.1 (*) 20.0 - 24.0 (mEq/L)    TCO2 32  0 - 100 (mmol/L)    O2 Saturation 73.0  Acid-Base Excess 3.0 (*) 0.0 - 2.0 (mmol/L)    Sodium 134 (*) 135 - 145 (mEq/L)    Potassium 4.3  3.5 - 5.1 (mEq/L)    Calcium, Ion 1.12  1.12 - 1.32 (mmol/L)    HCT 30.0 (*) 39.0 - 52.0 (%)    Hemoglobin 10.2 (*) 13.0 - 17.0 (g/dL)    Patient temperature 97.5 F      Collection site ARTERIAL LINE      Drawn by Operator      Sample type VENOUS     GLUCOSE, CAPILLARY     Status: Abnormal   Collection Time   06/11/11  8:09 PM      Component Value Range Comment   Glucose-Capillary 153 (*) 70 - 99 (mg/dL)   RENAL FUNCTION PANEL     Status: Abnormal   Collection Time   06/11/11 10:00 PM      Component Value Range Comment   Sodium 133 (*) 135 - 145 (mEq/L)    Potassium 4.3  3.5 - 5.1 (mEq/L)    Chloride 95 (*) 96 - 112 (mEq/L)    CO2 29  19 - 32 (mEq/L)    Glucose, Bld  145 (*) 70 - 99 (mg/dL)    BUN 46 (*) 6 - 23 (mg/dL)    Creatinine, Ser 1.61 (*) 0.50 - 1.35 (mg/dL)    Calcium 9.1  8.4 - 10.5 (mg/dL)    Phosphorus 3.7  2.3 - 4.6 (mg/dL)    Albumin 2.1 (*) 3.5 - 5.2 (g/dL)    GFR calc non Af Amer 38 (*) >90 (mL/min)    GFR calc Af Amer 44 (*) >90 (mL/min)   POCT I-STAT 7, (EG7 V)     Status: Abnormal   Collection Time   06/11/11 11:10 PM      Component Value Range Comment   pH, Ven 7.319 (*) 7.250 - 7.300     pCO2, Ven 58.9 (*) 45.0 - 50.0 (mmHg)    pO2, Ven 40.0  30.0 - 45.0 (mmHg)    Bicarbonate 30.3 (*) 20.0 - 24.0 (mEq/L)    TCO2 32  0 - 100 (mmol/L)    O2 Saturation 69.0      Acid-Base Excess 3.0 (*) 0.0 - 2.0 (mmol/L)    Sodium 137  135 - 145 (mEq/L)    Potassium 4.0  3.5 - 5.1 (mEq/L)    Calcium, Ion 0.55 (*) 1.12 - 1.32 (mmol/L)    HCT 32.0 (*) 39.0 - 52.0 (%)    Hemoglobin 10.9 (*) 13.0 - 17.0 (g/dL)    Sample type VENOUS      Comment NOTIFIED PHYSICIAN     POCT I-STAT 7, (EG7 V)     Status: Abnormal   Collection Time   06/11/11 11:15 PM      Component Value Range Comment   pH, Ven 7.347 (*) 7.250 - 7.300     pCO2, Ven 58.1 (*) 45.0 - 50.0 (mmHg)    pO2, Ven 37.0  30.0 - 45.0 (mmHg)    Bicarbonate 31.8 (*) 20.0 - 24.0 (mEq/L)    TCO2 34  0 - 100 (mmol/L)    O2 Saturation 66.0      Acid-Base Excess 5.0 (*) 0.0 - 2.0 (mmol/L)    Sodium 135  135 - 145 (mEq/L)    Potassium 4.3  3.5 - 5.1 (mEq/L)    Calcium, Ion 1.15  1.12 - 1.32 (mmol/L)    HCT 30.0 (*) 39.0 - 52.0 (%)  Hemoglobin 10.2 (*) 13.0 - 17.0 (g/dL)    Sample type VENOUS     GLUCOSE, CAPILLARY     Status: Abnormal   Collection Time   06/11/11 11:57 PM      Component Value Range Comment   Glucose-Capillary 128 (*) 70 - 99 (mg/dL)    Comment 1 Documented in Chart      Comment 2 Notify RN     POCT I-STAT 7, (EG7 V)     Status: Abnormal   Collection Time   06/12/11  3:02 AM      Component Value Range Comment   pH, Ven 7.354 (*) 7.250 - 7.300     pCO2, Ven 56.0 (*)  45.0 - 50.0 (mmHg)    pO2, Ven 38.0  30.0 - 45.0 (mmHg)    Bicarbonate 31.2 (*) 20.0 - 24.0 (mEq/L)    TCO2 33  0 - 100 (mmol/L)    O2 Saturation 68.0      Acid-Base Excess 4.0 (*) 0.0 - 2.0 (mmol/L)    Sodium 137  135 - 145 (mEq/L)    Potassium 4.1  3.5 - 5.1 (mEq/L)    Calcium, Ion 0.53 (*) 1.12 - 1.32 (mmol/L)    HCT 34.0 (*) 39.0 - 52.0 (%)    Hemoglobin 11.6 (*) 13.0 - 17.0 (g/dL)    Sample type VENOUS      Comment NOTIFIED PHYSICIAN     POCT I-STAT 7, (EG7 V)     Status: Abnormal   Collection Time   06/12/11  3:06 AM      Component Value Range Comment   pH, Ven 7.344 (*) 7.250 - 7.300     pCO2, Ven 60.2 (*) 45.0 - 50.0 (mmHg)    pO2, Ven 39.0  30.0 - 45.0 (mmHg)    Bicarbonate 32.8 (*) 20.0 - 24.0 (mEq/L)    TCO2 35  0 - 100 (mmol/L)    O2 Saturation 69.0      Acid-Base Excess 6.0 (*) 0.0 - 2.0 (mmol/L)    Sodium 135  135 - 145 (mEq/L)    Potassium 4.2  3.5 - 5.1 (mEq/L)    Calcium, Ion 1.15  1.12 - 1.32 (mmol/L)    HCT 33.0 (*) 39.0 - 52.0 (%)    Hemoglobin 11.2 (*) 13.0 - 17.0 (g/dL)    Sample type VENOUS     MAGNESIUM     Status: Normal   Collection Time   06/12/11  3:45 AM      Component Value Range Comment   Magnesium 2.4  1.5 - 2.5 (mg/dL)   CALCIUM, IONIZED     Status: Normal   Collection Time   06/12/11  3:45 AM      Component Value Range Comment   Calcium, Ion 1.14  1.12 - 1.32 (mmol/L)   CBC     Status: Abnormal   Collection Time   06/12/11  3:45 AM      Component Value Range Comment   WBC 30.3 (*) 4.0 - 10.5 (K/uL)    RBC 3.09 (*) 4.22 - 5.81 (MIL/uL)    Hemoglobin 9.2 (*) 13.0 - 17.0 (g/dL)    HCT 47.8 (*) 29.5 - 52.0 (%)    MCV 89.6  78.0 - 100.0 (fL)    MCH 29.8  26.0 - 34.0 (pg)    MCHC 33.2  30.0 - 36.0 (g/dL)    RDW 62.1 (*) 30.8 - 15.5 (%)    Platelets 89 (*) 150 - 400 (K/uL) CONSISTENT WITH PREVIOUS  RESULT  RENAL FUNCTION PANEL     Status: Abnormal   Collection Time   06/12/11  3:45 AM      Component Value Range Comment   Sodium 133 (*) 135  - 145 (mEq/L)    Potassium 4.2  3.5 - 5.1 (mEq/L)    Chloride 93 (*) 96 - 112 (mEq/L)    CO2 30  19 - 32 (mEq/L)    Glucose, Bld 138 (*) 70 - 99 (mg/dL)    BUN 43 (*) 6 - 23 (mg/dL)    Creatinine, Ser 0.98 (*) 0.50 - 1.35 (mg/dL)    Calcium 9.8  8.4 - 10.5 (mg/dL)    Phosphorus 3.4  2.3 - 4.6 (mg/dL)    Albumin 2.3 (*) 3.5 - 5.2 (g/dL)    GFR calc non Af Amer 40 (*) >90 (mL/min)    GFR calc Af Amer 47 (*) >90 (mL/min)   GLUCOSE, CAPILLARY     Status: Abnormal   Collection Time   06/12/11  3:58 AM      Component Value Range Comment   Glucose-Capillary 142 (*) 70 - 99 (mg/dL)    Comment 1 Documented in Chart      Comment 2 Notify RN     GLUCOSE, CAPILLARY     Status: Abnormal   Collection Time   06/12/11  8:12 AM      Component Value Range Comment   Glucose-Capillary 170 (*) 70 - 99 (mg/dL)   GLUCOSE, CAPILLARY     Status: Abnormal   Collection Time   06/12/11 11:55 AM      Component Value Range Comment   Glucose-Capillary 154 (*) 70 - 99 (mg/dL)   GLUCOSE, CAPILLARY     Status: Abnormal   Collection Time   06/12/11  3:49 PM      Component Value Range Comment   Glucose-Capillary 152 (*) 70 - 99 (mg/dL)   POCT I-STAT 7, (EG7 V)     Status: Abnormal   Collection Time   06/12/11  4:12 PM      Component Value Range Comment   pH, Ven 7.399 (*) 7.250 - 7.300     pCO2, Ven 54.4 (*) 45.0 - 50.0 (mmHg)    pO2, Ven 32.0  30.0 - 45.0 (mmHg)    Bicarbonate 33.6 (*) 20.0 - 24.0 (mEq/L)    TCO2 35  0 - 100 (mmol/L)    O2 Saturation 61.0      Acid-Base Excess 7.0 (*) 0.0 - 2.0 (mmol/L)    Sodium 138  135 - 145 (mEq/L)    Potassium 3.7  3.5 - 5.1 (mEq/L)    Calcium, Ion 0.55 (*) 1.12 - 1.32 (mmol/L)    HCT 34.0 (*) 39.0 - 52.0 (%)    Hemoglobin 11.6 (*) 13.0 - 17.0 (g/dL)    Patient temperature 98.5 F      Collection site CVVH      Drawn by Operator      Sample type VENOUS      Comment VALUES EXPECTED, NO REPEAT     RENAL FUNCTION PANEL     Status: Abnormal   Collection Time    06/12/11  4:22 PM      Component Value Range Comment   Sodium 135  135 - 145 (mEq/L)    Potassium 3.6  3.5 - 5.1 (mEq/L)    Chloride 93 (*) 96 - 112 (mEq/L)    CO2 33 (*) 19 - 32 (mEq/L)    Glucose, Bld 175 (*) 70 -  99 (mg/dL)    BUN 36 (*) 6 - 23 (mg/dL)    Creatinine, Ser 4.54 (*) 0.50 - 1.35 (mg/dL)    Calcium 09.8  8.4 - 10.5 (mg/dL)    Phosphorus 2.6  2.3 - 4.6 (mg/dL)    Albumin 2.3 (*) 3.5 - 5.2 (g/dL)    GFR calc non Af Amer 46 (*) >90 (mL/min)    GFR calc Af Amer 53 (*) >90 (mL/min)   POCT I-STAT 7, (EG7 V)     Status: Abnormal   Collection Time   06/12/11  4:48 PM      Component Value Range Comment   pH, Ven 7.418 (*) 7.250 - 7.300     pCO2, Ven 54.6 (*) 45.0 - 50.0 (mmHg)    pO2, Ven 33.0  30.0 - 45.0 (mmHg)    Bicarbonate 35.4 (*) 20.0 - 24.0 (mEq/L)    TCO2 37  0 - 100 (mmol/L)    O2 Saturation 65.0      Acid-Base Excess 9.0 (*) 0.0 - 2.0 (mmol/L)    Sodium 136  135 - 145 (mEq/L)    Potassium 3.5  3.5 - 5.1 (mEq/L)    Calcium, Ion 1.21  1.12 - 1.32 (mmol/L)    HCT 31.0 (*) 39.0 - 52.0 (%)    Hemoglobin 10.5 (*) 13.0 - 17.0 (g/dL)    Patient temperature 98.0 F      Collection site [facility] type CARDIOPULMONARY BYPASS      Comment VALUES EXPECTED, NO REPEAT     GLUCOSE, CAPILLARY     Status: Abnormal   Collection Time   06/12/11  8:12 PM      Component Value Range Comment   Glucose-Capillary 149 (*) 70 - 99 (mg/dL)   RENAL FUNCTION PANEL     Status: Abnormal   Collection Time   06/12/11 10:00 PM      Component Value Range Comment   Sodium 136  135 - 145 (mEq/L)    Potassium 3.4 (*) 3.5 - 5.1 (mEq/L)    Chloride 92 (*) 96 - 112 (mEq/L)    CO2 35 (*) 19 - 32 (mEq/L)    Glucose, Bld 147 (*) 70 - 99 (mg/dL)    BUN 32 (*) 6 - 23 (mg/dL)    Creatinine, Ser 1.19 (*) 0.50 - 1.35 (mg/dL)    Calcium 14.7  8.4 - 10.5 (mg/dL)    Phosphorus 2.5  2.3 - 4.6 (mg/dL)    Albumin 2.1 (*) 3.5 - 5.2 (g/dL)    GFR calc non Af Amer 50 (*) >90 (mL/min)    GFR calc  Af Amer 58 (*) >90 (mL/min)   CBC     Status: Abnormal   Collection Time   06/12/11 10:00 PM      Component Value Range Comment   WBC 25.6 (*) 4.0 - 10.5 (K/uL)    RBC 2.95 (*) 4.22 - 5.81 (MIL/uL)    Hemoglobin 8.6 (*) 13.0 - 17.0 (g/dL) DELTA CHECK NOTED   HCT 26.9 (*) 39.0 - 52.0 (%)    MCV 91.2  78.0 - 100.0 (fL)    MCH 29.2  26.0 - 34.0 (pg)    MCHC 32.0  30.0 - 36.0 (g/dL)    RDW 82.9 (*) 56.2 - 15.5 (%)    Platelets 84 (*) 150 - 400 (K/uL) CONSISTENT WITH PREVIOUS RESULT  GLUCOSE, CAPILLARY     Status: Abnormal   Collection Time   06/12/11 11:49 PM  Component Value Range Comment   Glucose-Capillary 110 (*) 70 - 99 (mg/dL)    Comment 1 Documented in Chart      Comment 2 Notify RN     POCT I-STAT 7, (EG7 V)     Status: Abnormal   Collection Time   06/13/11 12:26 AM      Component Value Range Comment   pH, Ven 7.423 (*) 7.250 - 7.300     pCO2, Ven 55.7 (*) 45.0 - 50.0 (mmHg)    pO2, Ven 36.0  30.0 - 45.0 (mmHg)    Bicarbonate 36.4 (*) 20.0 - 24.0 (mEq/L)    TCO2 38  0 - 100 (mmol/L)    O2 Saturation 69.0      Acid-Base Excess 10.0 (*) 0.0 - 2.0 (mmol/L)    Sodium 138  135 - 145 (mEq/L)    Potassium 3.4 (*) 3.5 - 5.1 (mEq/L)    Calcium, Ion 1.25  1.12 - 1.32 (mmol/L)    HCT 29.0 (*) 39.0 - 52.0 (%)    Hemoglobin 9.9 (*) 13.0 - 17.0 (g/dL)    Sample type VENOUS     MAGNESIUM     Status: Normal   Collection Time   06/13/11  3:44 AM      Component Value Range Comment   Magnesium 2.2  1.5 - 2.5 (mg/dL)   CBC     Status: Abnormal   Collection Time   06/13/11  3:44 AM      Component Value Range Comment   WBC 23.2 (*) 4.0 - 10.5 (K/uL)    RBC 3.08 (*) 4.22 - 5.81 (MIL/uL)    Hemoglobin 9.2 (*) 13.0 - 17.0 (g/dL)    HCT 16.1 (*) 09.6 - 52.0 (%)    MCV 92.5  78.0 - 100.0 (fL)    MCH 29.9  26.0 - 34.0 (pg)    MCHC 32.3  30.0 - 36.0 (g/dL)    RDW 04.5 (*) 40.9 - 15.5 (%)    Platelets 81 (*) 150 - 400 (K/uL) CONSISTENT WITH PREVIOUS RESULT  RENAL FUNCTION PANEL      Status: Abnormal   Collection Time   06/13/11  3:44 AM      Component Value Range Comment   Sodium 135  135 - 145 (mEq/L)    Potassium 3.5  3.5 - 5.1 (mEq/L)    Chloride 90 (*) 96 - 112 (mEq/L)    CO2 34 (*) 19 - 32 (mEq/L)    Glucose, Bld 170 (*) 70 - 99 (mg/dL)    BUN 31 (*) 6 - 23 (mg/dL)    Creatinine, Ser 8.11 (*) 0.50 - 1.35 (mg/dL)    Calcium 91.4  8.4 - 10.5 (mg/dL)    Phosphorus 2.4  2.3 - 4.6 (mg/dL)    Albumin 2.1 (*) 3.5 - 5.2 (g/dL)    GFR calc non Af Amer 48 (*) >90 (mL/min)    GFR calc Af Amer 56 (*) >90 (mL/min)   GLUCOSE, CAPILLARY     Status: Abnormal   Collection Time   06/13/11  3:47 AM      Component Value Range Comment   Glucose-Capillary 159 (*) 70 - 99 (mg/dL)    Comment 1 Documented in Chart      Comment 2 Notify RN     POCT I-STAT 7, (EG7 V)     Status: Abnormal   Collection Time   06/13/11  3:50 AM      Component Value Range Comment   pH, Ven 7.411 (*)  7.250 - 7.300     pCO2, Ven 57.9 (*) 45.0 - 50.0 (mmHg)    pO2, Ven 29.0 (*) 30.0 - 45.0 (mmHg)    Bicarbonate 36.7 (*) 20.0 - 24.0 (mEq/L)    TCO2 38  0 - 100 (mmol/L)    O2 Saturation 54.0      Acid-Base Excess 10.0 (*) 0.0 - 2.0 (mmol/L)    Sodium 137  135 - 145 (mEq/L)    Potassium 3.6  3.5 - 5.1 (mEq/L)    Calcium, Ion 1.16  1.12 - 1.32 (mmol/L)    HCT 31.0 (*) 39.0 - 52.0 (%)    Hemoglobin 10.5 (*) 13.0 - 17.0 (g/dL)    Sample type VENOUS     GLUCOSE, CAPILLARY     Status: Abnormal   Collection Time   06/13/11  8:01 AM      Component Value Range Comment   Glucose-Capillary 155 (*) 70 - 99 (mg/dL)   POCT I-STAT 7, (EG7 V)     Status: Abnormal   Collection Time   06/13/11  9:15 AM      Component Value Range Comment   pH, Ven 7.423 (*) 7.250 - 7.300     pCO2, Ven 54.5 (*) 45.0 - 50.0 (mmHg)    pO2, Ven 29.0 (*) 30.0 - 45.0 (mmHg)    Bicarbonate 35.7 (*) 20.0 - 24.0 (mEq/L)    TCO2 37  0 - 100 (mmol/L)    O2 Saturation 57.0      Acid-Base Excess 9.0 (*) 0.0 - 2.0 (mmol/L)    Sodium 138   135 - 145 (mEq/L)    Potassium 3.4 (*) 3.5 - 5.1 (mEq/L)    Calcium, Ion 0.51 (*) 1.12 - 1.32 (mmol/L)    HCT 35.0 (*) 39.0 - 52.0 (%)    Hemoglobin 11.9 (*) 13.0 - 17.0 (g/dL)    Patient temperature 97.8 F      Collection site CVVH      Drawn by Operator      Sample type VENOUS      Comment VALUES EXPECTED, NO REPEAT     POCT I-STAT 7, (EG7 V)     Status: Abnormal   Collection Time   06/13/11  9:22 AM      Component Value Range Comment   pH, Ven 7.449 (*) 7.250 - 7.300     pCO2, Ven 54.2 (*) 45.0 - 50.0 (mmHg)    pO2, Ven 25.0 (*) 30.0 - 45.0 (mmHg)    Bicarbonate 37.7 (*) 20.0 - 24.0 (mEq/L)    TCO2 39  0 - 100 (mmol/L)    O2 Saturation 49.0      Acid-Base Excess 12.0 (*) 0.0 - 2.0 (mmol/L)    Sodium 137  135 - 145 (mEq/L)    Potassium 3.4 (*) 3.5 - 5.1 (mEq/L)    Calcium, Ion 1.15  1.12 - 1.32 (mmol/L)    HCT 32.0 (*) 39.0 - 52.0 (%)    Hemoglobin 10.9 (*) 13.0 - 17.0 (g/dL)    Patient temperature 97.8 F      Collection site [employer] by Operator      Sample type VENOUS      Comment VALUES EXPECTED, NO REPEAT     GLUCOSE, CAPILLARY     Status: Abnormal   Collection Time   06/13/11 11:57 AM      Component Value Range Comment   Glucose-Capillary 148 (*) 70 - 99 (mg/dL)     Dg  Chest Port 1v Same Day  06/13/2011  *RADIOLOGY REPORT*  Clinical data:  Respiratory distress, intubation  PORTABLE CHEST ONE-VIEW:  Comparison:  Portable exam 1012 hours compared to 06/11/2011  Findings: Tip of endotracheal tube 1.3 cm above carina. Nasogastric tube and feeding tube extend into stomach. Bilateral jugular central venous catheters, tips project over SVC. Enlargement of cardiac silhouette with pulmonary vascular congestion. Decreased lung volumes with bibasilar atelectasis versus consolidation. Question perihilar edema. No pneumothorax.  IMPRESSION: Enlargement of cardiac silhouette with pulmonary vascular congestion. Bibasilar atelectasis versus consolidation and question  minimal perihilar edema.  Original Report Authenticated By: Lollie Marrow, M.D.    Blood pressure 131/78, pulse 142, temperature 97.8 F (36.6 C), temperature source Oral, resp. rate 25, height 5\' 11"  (1.803 m), weight 139.3 kg (307 lb 1.6 oz), SpO2 94.00%.  Extubated. Groggy. Periods of apnea. Stridorous JVP unable to see.  Chest clear Heart rapid irregular rate. Pdistant Abdomen: soft.  Obese No apparent tenderness. Extremities: 3-4+ chronic pitting edema. weeping  Tele: Afib/flutter 120-140  Assessment: 1. Shock , multifactorial - septic/cardiogenic       --BAL + parainfluenza 2. Atrial flutter with rapid ventricular response of uncertain duration 3. Cardiomyopathy ? Etiology.  There are regional wall motion abnormalities with severe depression of LV systolic function EF 15-20%.  This could be from chronic ischemic heart disease but no history of angina etc. Tachycardia-induced cardiomyopathy also a possibility. 4. Mildly elevated troponin consistent with enzyme leak secondary to tachycardia and shock rather than a primary cardiac event (MI). 5. PNA 6. VDRF 7. Acute on chronic renal failure s/p renal transplant  Rec:    He remains critically ill but overall improving. Respiratory status currently very tenuous after extubation. P/ccm evaluating for possible re-intubation.    Remains in AF/AFL with rapid rates despite amio. He is off pressors but I am still reluctant to give lopressor with his respiratory status. If pulmonary ok with it can try low-dose lopressor 2.5 IV q 6. Avoiding cardizem due to low EF.  I discussed possibility of TEE/DC-CV with Dr. Sung Amabile to see if this would help his progress. We discussed DC-CV but as he is unable to be anti-coagulated with dropping PLTs will defer for now. Continue IV amio. Hard to dose digoxin while on CVVHD so would avoid.   Ethanael Veith 06/13/2011, 2:23 PM

## 2011-06-13 NOTE — Progress Notes (Signed)
Subjective: Intubated but delirious Lines / Drains:  R IJ HD cath 5/12 >> 5/13 (inadvertent removal)  Aline 5/8>>>out  R IJ HD cath 5/13 >>  Left IJ 5/8>>>  ETT 5/8>>>   Antibiotics:  Anti-infectives     Start     Dose/Rate Route Frequency Ordered Stop   06/12/11 1000   Ribavirin SOLN 400 mg     Comments: Special order item.       400 mg Per Tube 3 times daily 06/11/11 1346     06/11/11 1600   ribavirin (REBETOL) capsule 400 mg  Status:  Discontinued        400 mg Oral 3 times daily 06/11/11 1239 06/11/11 1343   06/08/11 2200   ceFEPIme (MAXIPIME) 2 g in dextrose 5 % 50 mL IVPB     Comments: Use Cefepime 1 g IV q12h for CrCl< 60 mL/min      2 g 100 mL/hr over 30 Minutes Intravenous Every 12 hours 06/08/11 1536 06/11/11 2320   06/08/11 0800   piperacillin-tazobactam (ZOSYN) IVPB 3.375 g  Status:  Discontinued        3.375 g 12.5 mL/hr over 240 Minutes Intravenous Every 8 hours 06/08/11 0149 06/08/11 1155   06/08/11 0600   micafungin (MYCAMINE) 100 mg in sodium chloride 0.9 % 100 mL IVPB  Status:  Discontinued        100 mg 100 mL/hr over 1 Hours Intravenous Daily 06/08/11 0308 06/10/11 1703   06/08/11 0200  piperacillin-tazobactam (ZOSYN) IVPB 3.375 g       3.375 g 100 mL/hr over 30 Minutes Intravenous  Once 06/08/11 0149 06/08/11 0254   06/06/11 0600   vancomycin (VANCOCIN) 1,750 mg in sodium chloride 0.9 % 500 mL IVPB  Status:  Discontinued        1,750 mg 250 mL/hr over 120 Minutes Intravenous Every 24 hours 06/05/11 0906 06/08/11 0403   06/05/11 2200   levofloxacin (LEVAQUIN) IVPB 750 mg  Status:  Discontinued     Comments: Use Levaquin 750 mg IV q48h for CrCl < 6mL/min      750 mg 100 mL/hr over 90 Minutes Intravenous Every 48 hours 06/04/11 0344 06/05/11 0908   06/05/11 1000   levofloxacin (LEVAQUIN) IVPB 750 mg  Status:  Discontinued     Comments: Use Levaquin 750 mg IV q48h for CrCl < 32mL/min      750 mg 100 mL/hr over 90 Minutes Intravenous Every 24 hours  06/05/11 0908 06/06/11 0908   06/04/11 1200   sulfamethoxazole-trimethoprim (BACTRIM) 664 mg in dextrose 5 % 500 mL IVPB  Status:  Discontinued        20 mg/kg/day  132.7 kg 361 mL/hr over 90 Minutes Intravenous 4 times per day 06/04/11 1039 06/06/11 0908   06/04/11 1100   micafungin (MYCAMINE) 100 mg in sodium chloride 0.9 % 100 mL IVPB  Status:  Discontinued        100 mg 100 mL/hr over 1 Hours Intravenous Daily 06/04/11 1030 06/06/11 0908   06/04/11 0600   ceFEPIme (MAXIPIME) 1 g in dextrose 5 % 50 mL IVPB  Status:  Discontinued     Comments: Use Cefepime 1 g IV q12h for CrCl< 60 mL/min      1 g 100 mL/hr over 30 Minutes Intravenous Every 12 hours 06/04/11 0344 06/08/11 1536   06/04/11 0600   vancomycin (VANCOCIN) 1,500 mg in sodium chloride 0.9 % 500 mL IVPB  Status:  Discontinued  1,500 mg 250 mL/hr over 120 Minutes Intravenous Every 24 hours 06/04/11 0425 06/05/11 0906   06/04/11 0430   levofloxacin (LEVAQUIN) IVPB 750 mg        750 mg 100 mL/hr over 90 Minutes Intravenous  Once 06/04/11 0354 06/04/11 0632   06/03/11 2330   vancomycin (VANCOCIN) IVPB 1000 mg/200 mL premix        1,000 mg 200 mL/hr over 60 Minutes Intravenous  Once 06/03/11 2326 06/04/11 0051   06/03/11 2330  piperacillin-tazobactam (ZOSYN) IVPB 3.375 g       3.375 g 12.5 mL/hr over 240 Minutes Intravenous  Once 06/03/11 2326 06/04/11 0521          Medications: Scheduled Meds:    . antiseptic oral rinse  15 mL Mouth Rinse QID  . chlorhexidine  15 mL Mouth Rinse BID  . collagenase   Topical Daily  . darbepoetin (ARANESP) injection - DIALYSIS  100 mcg Intravenous Q Wed-HD  . feeding supplement (OXEPA)  1,000 mL Per Tube Q24H  . feeding supplement  60 mL Per Tube QID  . haloperidol lactate  5 mg Intravenous Once  . hydrocortisone sod succinate (SOLU-CORTEF) injection  50 mg Intravenous Q12H  . IMMUNE GLOBLULIN (HUMAN) IV  0.5 g/kg Intravenous QODAY  . insulin aspart  0-4 Units Subcutaneous Q4H   . midazolam      . midazolam      . midazolam      . mulitivitamin  5 mL Per Tube Daily  . pantoprazole sodium  40 mg Per Tube BID  . Ribavirin  400 mg Per Tube TID  . sirolimus  0.75 mg Oral Daily  . DISCONTD: heparin subcutaneous  5,000 Units Subcutaneous Q8H  . DISCONTD: hydrocortisone sod succinate (SOLU-CORTEF) injection  50 mg Intravenous Q8H   Continuous Infusions:    . sodium chloride 20 mL/hr at 06/13/11 0700  . amiodarone (NEXTERONE PREMIX) 360 mg/200 mL dextrose 0.501 mg/min (06/13/11 0700)  . calcium gluconate infusion for CRRT 20 g (06/13/11 0700)  . fentaNYL infusion INTRAVENOUS 100 mcg/hr (06/13/11 0700)  . phenylephrine (NEO-SYNEPHRINE) Adult infusion Stopped (06/13/11 1610)  . dialysis replacement fluid (prismasate) 200 mL/hr at 06/12/11 1710  . dialysate (PRISMASATE) 2,000 mL/hr at 06/13/11 0934  . sodium citrate 2 %/dextrose 2.5% solution 3000 mL 390 mL/hr at 06/13/11 0936  . DISCONTD: milrinone Stopped (06/13/11 0945)  . DISCONTD: vasopressin (PITRESSIN) infusion - *FOR SHOCK* Stopped (06/12/11 0500)   PRN Meds:.albuterol, fentaNYL, haloperidol lactate, metoprolol, DISCONTD: heparin, DISCONTD: heparin, DISCONTD: midazolam, DISCONTD: midazolam, DISCONTD: vasopressin (PITRESSIN) infusion - *FOR SHOCK*   Objective: Weight change: -6 lb 6.3 oz (-2.9 kg)  Intake/Output Summary (Last 24 hours) at 06/13/11 1038 Last data filed at 06/13/11 1000  Gross per 24 hour  Intake 4564.55 ml  Output   6561 ml  Net -1996.45 ml   Blood pressure 145/68, pulse 140, temperature 97.8 F (36.6 C), temperature source Oral, resp. rate 15, height 5\' 11"  (1.803 m), weight 307 lb 1.6 oz (139.3 kg), SpO2 92.00%. Temp:  [97.7 F (36.5 C)-99.4 F (37.4 C)] 97.8 F (36.6 C) (05/17 0802) Pulse Rate:  [86-154] 140  (05/17 1015) Resp:  [13-26] 15  (05/17 1015) BP: (70-145)/(39-106) 145/68 mmHg (05/17 1015) SpO2:  [88 %-100 %] 92 % (05/17 1015) FiO2 (%):  [30 %-40 %] 30 % (05/17  0800) Weight:  [307 lb 1.6 oz (139.3 kg)] 307 lb 1.6 oz (139.3 kg) (05/17 0500)  Physical Exam: General: intubated,delirous agitated HEENT: anicteric sclera,  pupils reactive to light and accommodation, EOMI CVS irr irr  normal r,  no murmur rubs or gallops Chest: coarse breath sounds Abdomen: distended, pos bs Extremities: 2- edema Skin: no rashes Lymph: no new lymphadenopathy Neuro: nonfocal,  Rectal tube with copious stool  Lab Results:  Basename 06/13/11 0922 06/13/11 0915 06/13/11 0344 06/12/11 2200  WBC -- -- 23.2* 25.6*  HGB 10.9* 11.9* -- --  HCT 32.0* 35.0* -- --  PLT -- -- 81* 84*    BMET  Basename 06/13/11 0922 06/13/11 0915 06/13/11 0344 06/12/11 2200  NA 137 138 -- --  K 3.4* 3.4* -- --  CL -- -- 90* 92*  CO2 -- -- 34* 35*  GLUCOSE -- -- 170* 147*  BUN -- -- 31* 32*  CREATININE -- -- 1.57* 1.53*  CALCIUM -- -- 10.2 10.0    Micro Results: Recent Results (from the past 240 hour(s))  URINE CULTURE     Status: Normal   Collection Time   06/03/11 10:53 PM      Component Value Range Status Comment   Specimen Description URINE, RANDOM   Final    Special Requests ADDED [id number]   Final    Culture  Setup Time [id number]   Final    Colony Count >=100,000 COLONIES/ML   Final    Culture     Final    Value: Multiple bacterial morphotypes present, none predominant. Suggest appropriate recollection if clinically indicated.   Report Status 06/05/2011 FINAL   Final   CULTURE, BLOOD (ROUTINE X 2)     Status: Normal   Collection Time   06/03/11 11:45 PM      Component Value Range Status Comment   Specimen Description BLOOD RIGHT ARM   Final    Special Requests BOTTLES DRAWN AEROBIC AND ANAEROBIC 10CC   Final    Culture  Setup Time [id number]   Final    Culture NO GROWTH 5 DAYS   Final    Report Status 06/10/2011 FINAL   Final   CULTURE, BLOOD (ROUTINE X 2)     Status: Normal   Collection Time   06/03/11 11:50 PM      Component Value Range Status Comment    Specimen Description BLOOD RIGHT ARM   Final    Special Requests     Final    Value: BOTTLES DRAWN AEROBIC AND ANAEROBIC 5CC AEROBOC 3CC ANAEROBIC   Culture  Setup Time [id number]   Final    Culture NO GROWTH 5 DAYS   Final    Report Status 06/10/2011 FINAL   Final   MRSA PCR SCREENING     Status: Normal   Collection Time   06/04/11  3:08 AM      Component Value Range Status Comment   MRSA by PCR NEGATIVE  NEGATIVE  Final   RESPIRATORY VIRUS PANEL (18 COMPONENTS)     Status: Abnormal   Collection Time   06/04/11  6:09 AM      Component Value Range Status Comment   Source - RVPAN NASAL MUCOSA   Final    Respiratory Syncytial Virus A NOT DETECTED   Final    Respiratory Syncytial Virus B NOT DETECTED   Final    Influenza A NOT DETECTED   Final    Influenza B NOT DETECTED   Final    Parainfluenza 1 NOT DETECTED   Final    Parainfluenza 2 NOT DETECTED   Final    Parainfluenza 3 DETECTED (*)  Final    Parainfluenza 4 NOT DETECTED   Final    Metapneumovirus NOT DETECTED   Final    Cox[place]ie and Echovirus NOT DETECTED   Final    Rhinovirus NOT DETECTED   Final    Adenovirus B NOT DETECTED   Final    Adenovirus E NOT DETECTED   Final    CoronavirusNL63 NOT DETECTED   Final    CoronavirusHKU1 NOT DETECTED   Final    Coronavirus229E NOT DETECTED   Final    CoronavirusOC43 NOT DETECTED   Final   CULTURE, EXPECTORATED SPUTUM-ASSESSMENT     Status: Normal   Collection Time   06/04/11  7:48 AM      Component Value Range Status Comment   Specimen Description SPUTUM   Final    Special Requests NONE   Final    Sputum evaluation     Final    Value: MICROSCOPIC FINDINGS SUGGEST THAT THIS SPECIMEN IS NOT REPRESENTATIVE OF LOWER RESPIRATORY SECRETIONS. PLEASE RECOLLECT.     CALLED TO D ORTIZ,RN 06/04/11 0911 BY K SCHULTZ   Report Status 06/04/2011 FINAL   Final   AFB CULTURE WITH SMEAR     Status: Normal (Preliminary result)   Collection Time   06/04/11 10:30 AM      Component Value Range Status  Comment   Specimen Description BRONCHIAL WASHINGS   Final    Special Requests NONE   Final    ACID FAST SMEAR NO ACID FAST BACILLI SEEN   Final    Culture     Final    Value: CULTURE WILL BE EXAMINED FOR 6 WEEKS BEFORE ISSUING A FINAL REPORT   Report Status PENDING   Incomplete   PNEUMOCYSTIS JIROVECI SMEAR BY DFA     Status: Normal   Collection Time   06/04/11 10:30 AM      Component Value Range Status Comment   Specimen Source-[facility] BRONCHIAL WASHINGS   Final    Pneumocystis jiroveci Ag NEGATIVE   Final Performed at [facility]  FUNGUS CULTURE W SMEAR     Status: Normal (Preliminary result)   Collection Time   06/04/11 10:30 AM      Component Value Range Status Comment   Specimen Description BRONCHIAL WASHINGS   Final    Special Requests NONE   Final    Fungal Smear NO YEAST OR FUNGAL ELEMENTS SEEN   Final    Culture CULTURE IN PROGRESS FOR FOUR WEEKS   Final    Report Status PENDING   Incomplete   CULTURE, RESPIRATORY     Status: Normal   Collection Time   06/04/11 10:30 AM      Component Value Range Status Comment   Specimen Description BRONCHIAL WASHINGS   Final    Special Requests NONE   Final    Gram Stain     Final    Value: FEW WBC PRESENT,BOTH PMN AND MONONUCLEAR     NO SQUAMOUS EPITHELIAL CELLS SEEN     NO ORGANISMS SEEN   Culture NO GROWTH 2 DAYS   Final    Report Status 06/07/2011 FINAL   Final   HSV PCR     Status: Normal   Collection Time   06/04/11 10:30 AM      Component Value Range Status Comment   HSV, PCR Not Detected  Not Detected  Final    HSV 2 , PCR Not Detected  Not Detected  Final    Specimen  Source-HSVPCR NO GROWTH   Final   CULTURE, BLOOD (ROUTINE X 2)     Status: Normal   Collection Time   06/04/11  3:15 PM      Component Value Range Status Comment   Specimen Description BLOOD RIGHT HAND   Final    Special Requests BOTTLES DRAWN AEROBIC AND ANAEROBIC 10CC   Final    Culture  Setup Time [id number]   Final    Culture NO GROWTH 5 DAYS    Final    Report Status 06/11/2011 FINAL   Final   CULTURE, BLOOD (ROUTINE X 2)     Status: Normal   Collection Time   06/04/11  3:27 PM      Component Value Range Status Comment   Specimen Description BLOOD LEFT HAND   Final    Special Requests BOTTLES DRAWN AEROBIC ONLY 3CC   Final    Culture  Setup Time [id number]   Final    Culture NO GROWTH 5 DAYS   Final    Report Status 06/11/2011 FINAL   Final   CULTURE, BLOOD (ROUTINE X 2)     Status: Normal (Preliminary result)   Collection Time   06/08/11  4:15 AM      Component Value Range Status Comment   Specimen Description BLOOD LEFT HAND   Final    Special Requests BOTTLES DRAWN AEROBIC ONLY 10CC   Final    Culture  Setup Time [id number]   Final    Culture     Final    Value:        BLOOD CULTURE RECEIVED NO GROWTH TO DATE CULTURE WILL BE HELD FOR 5 DAYS BEFORE ISSUING A FINAL NEGATIVE REPORT   Report Status PENDING   Incomplete   CULTURE, BLOOD (ROUTINE X 2)     Status: Normal (Preliminary result)   Collection Time   06/08/11  4:20 AM      Component Value Range Status Comment   Specimen Description BLOOD LEFT HAND   Final    Special Requests BOTTLES DRAWN AEROBIC ONLY 10CC   Final    Culture  Setup Time [id number]   Final    Culture     Final    Value:        BLOOD CULTURE RECEIVED NO GROWTH TO DATE CULTURE WILL BE HELD FOR 5 DAYS BEFORE ISSUING A FINAL NEGATIVE REPORT   Report Status PENDING   Incomplete     Studies/Results: No results found.    Assessment/Plan: Chad [name] is a 56 y.o. male admitted with Pneumonia with sepsis -  He remains critically ill on pressors and did have some improvement with IVIG. He DOES have loose stool which cold be from feeding tube but I cannot see that C Diff PCR was checked and IVIG might have an effect on this disease process as well  Sepsis:  --I WILL MAKE SURE WE CHECK A C DIFF PCR AND START ORAL VANCO AND IV FLAGYL WHILE AWAITING C DIFF PCR RESULTS --WILL ALSO GET  KUB --continue the rx for parainfluenza   Parainfluenza: we  Have been concere3ed tha the parainfluenza virus could be the culprit with cardiac involvment. There is data in transplant patients that ribavirin with IVIg has some benefit to treat. Ribavirin dosing in CRRT though is unknown but pharmacokinetic studies in HD patients do show about 50% less clearance in HD than with normal renal function.   We are continuing with ribavirin today and will monitor  Hgb  2 times daily. Look for hemolytic anemia if there is significant change.    Dr. Drue Second will be covering this weekend..   LOS: 10 days   Acey Lav 06/13/2011, 10:38 AM

## 2011-06-13 NOTE — Progress Notes (Signed)
Subjective:  Remains critically ill on pressors, however, they have been weaned.  His O2 requirement is down as well.  No issues with the CRRT  Objective Vital signs in last 24 hours: Filed Vitals:   06/13/11 0730 06/13/11 0745 06/13/11 0800 06/13/11 0802  BP: 140/91 128/64 116/55   Pulse: 122 135 129   Temp:    97.8 F (36.6 C)  TempSrc:    Oral  Resp: 23 15 14    Height:      Weight:      SpO2: 98% 96% 96%    Weight change: -2.9 kg (-6 lb 6.3 oz)  Intake/Output Summary (Last 24 hours) at 06/13/11 0813 Last data filed at 06/13/11 0800  Gross per 24 hour  Intake 4545.96 ml  Output   5802 ml  Net -1256.04 ml   Labs: Basic Metabolic Panel:  Lab 06/13/11 4098 06/13/11 0344 06/13/11 0026 06/12/11 2200 06/12/11 1622  NA 137 135 138 -- --  K 3.6 3.5 3.4* -- --  CL -- 90* -- 92* 93*  CO2 -- 34* -- 35* 33*  GLUCOSE -- 170* -- 147* 175*  BUN -- 31* -- 32* 36*  CREATININE -- 1.57* -- 1.53* 1.64*  CALCIUM -- 10.2 -- 10.0 10.2  ALB -- -- -- -- --  PHOS -- 2.4 -- 2.5 2.6   Liver Function Tests:  Lab 06/13/11 0344 06/12/11 2200 06/12/11 1622 06/08/11 0230 06/07/11 0500  AST -- -- -- 85* 34  ALT -- -- -- 37 16  ALKPHOS -- -- -- 54 44  BILITOT -- -- -- 0.7 0.4  PROT -- -- -- 6.6 6.1  ALBUMIN 2.1* 2.1* 2.3* -- --   No results found for this basename: LIPASE:3,AMYLASE:3 in the last 168 hours No results found for this basename: AMMONIA:3 in the last 168 hours CBC:  Lab 06/13/11 0350 06/13/11 0344 06/13/11 0026 06/12/11 2200 06/12/11 0345 06/11/11 1650 06/11/11 0500 06/07/11 2105 06/07/11 0500  WBC -- 23.2* -- 25.6* 30.3* -- -- -- --  NEUTROABS -- -- -- -- -- -- -- 14.6* 14.4*  HGB 10.5* 9.2* 9.9* -- -- -- -- -- --  HCT 31.0* 28.5* 29.0* -- -- -- -- -- --  MCV -- 92.5 -- 91.2 89.6 89.1 90.4 -- --  PLT -- 81* -- 84* 89* -- -- -- --   Cardiac Enzymes:  Lab 06/08/11 2345 06/08/11 1509 06/08/11 1000 06/07/11 2105  CKTOTAL 220 247* 246* 138  CKMB 6.7* 6.0* 5.7* 2.9  CKMBINDEX  -- -- -- --  TROPONINI 0.81* 0.93* 0.59* <0.30   CBG:  Lab 06/13/11 0347 06/12/11 2349 06/12/11 2012 06/12/11 1549 06/12/11 1155  GLUCAP 159* 110* 149* 152* 154*    Iron Studies: No results found for this basename: IRON,TIBC,TRANSFERRIN,FERRITIN in the last 72 hours Studies/Results: Dg Naso G Tube Plc W/fl-no Rad  06/11/2011  CLINICAL DATA: Panda tube placement in post pyloric position   NASO G TUBE PLACEMENT WITH FLUORO  Fluoroscopy was utilized by the requesting physician.  No radiographic  interpretation.     Medications: Infusions:    . sodium chloride 20 mL/hr at 06/13/11 0700  . amiodarone (NEXTERONE PREMIX) 360 mg/200 mL dextrose 0.501 mg/min (06/13/11 0700)  . calcium gluconate infusion for CRRT 20 g (06/13/11 0700)  . fentaNYL infusion INTRAVENOUS 100 mcg/hr (06/13/11 0700)  . milrinone 0.25 mcg/kg/min (06/13/11 0700)  . phenylephrine (NEO-SYNEPHRINE) Adult infusion 80 mcg/min (06/13/11 0700)  . dialysis replacement fluid (prismasate) 200 mL/hr at 06/12/11 1710  .  dialysate (PRISMASATE) 2,000 mL/hr at 06/13/11 0703  . sodium citrate 2 %/dextrose 2.5% solution 3000 mL 250 mL/hr at 06/13/11 0210  . DISCONTD: dialysis replacement fluid (prismasate) 200 mL/hr at 06/11/11 1714  . DISCONTD: vasopressin (PITRESSIN) infusion - *FOR SHOCK* Stopped (06/12/11 0500)    Scheduled Medications:    . antiseptic oral rinse  15 mL Mouth Rinse QID  . chlorhexidine  15 mL Mouth Rinse BID  . collagenase   Topical Daily  . darbepoetin (ARANESP) injection - DIALYSIS  100 mcg Intravenous Q Wed-HD  . feeding supplement (OXEPA)  1,000 mL Per Tube Q24H  . feeding supplement  60 mL Per Tube QID  . haloperidol lactate  5 mg Intravenous Once  . heparin subcutaneous  5,000 Units Subcutaneous Q8H  . hydrocortisone sod succinate (SOLU-CORTEF) injection  50 mg Intravenous Q8H  . IMMUNE GLOBLULIN (HUMAN) IV  0.5 g/kg Intravenous QODAY  . insulin aspart  0-4 Units Subcutaneous Q4H  . midazolam        . midazolam      . midazolam      . mulitivitamin  5 mL Per Tube Daily  . pantoprazole sodium  40 mg Per Tube BID  . Ribavirin  400 mg Per Tube TID  . sirolimus  0.75 mg Oral Daily    have reviewed scheduled and prn medications.  Physical Exam: General: critically ill on vent,  sedated.  Did open eyes to stimuli Heart: tachy Lungs: CBS bilaterally Abdomen: slightly distended Extremities: chronic lymphedema but also pitting dependant edema Dialysis Access: R IJ cath placed 5/13   I Assessment/ Plan: Pt is a 56 y.o. yo male s/p renal transplant who was admitted on 06/03/2011 with PNA  pulm HTN, volume overload now with acute on chronic renal failure requiring institution of CRRT Assessment/Plan: 1. PNA/cardiomyopathy/hypotension- per CCM and cards.  Requiring significant vent and hemodynamic support, although does seem slightly better.  Cefepime, being followed by ID as well.  O2 req is down.  2. S/p renal transplant with CAN- now ARF-  Support with CRRT since 5/12.  Labs are stable,no disruption in treatment yesterday. continue same dialysate and replacement fluids.  On citrate protocol and is tolerating well.  Regarding renal transplant, will continue his home dose of sirolimus and stress dose steroids which were decreased 5/15.  Baseline function was not great with creatinine of 2.  Minimal UOP, will watch 3. Anemia- hgb stable today.  Citrate protocol and aranesp 4. Secondary hyperparathyroidism- no treatment right now, will stop rocatrol as is not essential at this time and calcium up 5. HTN/volume very overloaded appearing still.  Tolerating volume removal and does seem to have responded 6.  Critically ill- prognosis seems poor, but dare I say he is getting better.    Carmelita Amparo A   06/13/2011,8:13 AM  LOS: 10 days

## 2011-06-13 NOTE — Procedures (Signed)
Extubation Procedure Note  Patient Details:   Name: ROSARIO KUSHNER DOB: [birth year] MRN: [id number]   Airway Documentation:     Evaluation  O2 sats: stable throughout Complications: No apparent complications Patient did tolerate procedure well. Bilateral Breath Sounds: Diminished Suctioning: Airway Yes Pt. able to pull NIF of -20 via vent, hold head of bed x 10sec. (+) cuff leak noted, can draw Vt on command of 1200 cc, good gag reflex, placed on 2 lpm n/c 96%, RT to monitor. Joylene John 06/13/2011, 11:53 AM

## 2011-06-13 NOTE — Progress Notes (Signed)
eLink Physician-Brief Progress Note Patient Name: Chad [name] DOB: [birth year] MRN: [id number]  Date of Service  06/13/2011   HPI/Events of Note   Pt in respiratory failure  eICU Interventions  Pt reintubated.  See orders   Intervention Category Major Interventions: Respiratory failure - evaluation and management  Shan Levans 06/13/2011, 5:20 PM

## 2011-06-13 NOTE — Procedures (Signed)
Intubation Procedure Note Chad [name] [id number] [birth year]  Procedure: Intubation Indications: Respiratory insufficiency  Procedure Details Consent: Unable to obtain consent because of emergent medical necessity. Time Out: Verified patient identification, verified procedure, site/side was marked, verified correct patient position, special equipment/implants available, medications/allergies/relevent history reviewed, required imaging and test results available.  Performed  Maximum sterile technique was used including antiseptics, cap, gloves and mask.  MAC and 4    Evaluation Hemodynamic Status: BP stable throughout; O2 sats: stable throughout Patient's Current Condition: stable Complications: No apparent complications Patient did tolerate procedure well. Chest X-ray ordered to verify placement.  CXR: pending.   [name] 06/13/2011  Bloody secretions

## 2011-06-14 ENCOUNTER — Inpatient Hospital Stay (HOSPITAL_COMMUNITY): Payer: BC Managed Care – PPO

## 2011-06-14 LAB — POCT I-STAT EG7
Acid-Base Excess: 15 mmol/L — ABNORMAL HIGH (ref 0.0–2.0)
Acid-Base Excess: 9 mmol/L — ABNORMAL HIGH (ref 0.0–2.0)
Acid-Base Excess: 14 mmol/L — ABNORMAL HIGH (ref 0.0–2.0)
Acid-Base Excess: 11 mmol/L — ABNORMAL HIGH (ref 0.0–2.0)
Acid-Base Excess: 12 mmol/L — ABNORMAL HIGH (ref 0.0–2.0)
Acid-Base Excess: 17 mmol/L — ABNORMAL HIGH (ref 0.0–2.0)
Bicarbonate: 34.9 meq/L — ABNORMAL HIGH (ref 20.0–24.0)
Bicarbonate: 39.7 meq/L — ABNORMAL HIGH (ref 20.0–24.0)
Bicarbonate: 37.1 meq/L — ABNORMAL HIGH (ref 20.0–24.0)
Bicarbonate: 40.5 mEq/L — ABNORMAL HIGH (ref 20.0–24.0)
Bicarbonate: 37.4 mEq/L — ABNORMAL HIGH (ref 20.0–24.0)
Bicarbonate: 42.8 meq/L — ABNORMAL HIGH (ref 20.0–24.0)
Calcium, Ion: 1.06 mmol/L — ABNORMAL LOW (ref 1.12–1.32)
Calcium, Ion: 0.45 mmol/L — CL (ref 1.12–1.32)
Calcium, Ion: 1.07 mmol/L — ABNORMAL LOW (ref 1.12–1.32)
Calcium, Ion: 0.4 mmol/L — CL (ref 1.12–1.32)
Calcium, Ion: 1.14 mmol/L (ref 1.12–1.32)
HCT: 33 % — ABNORMAL LOW (ref 39.0–52.0)
HCT: 32 % — ABNORMAL LOW (ref 39.0–52.0)
HCT: 31 % — ABNORMAL LOW (ref 39.0–52.0)
HCT: 32 % — ABNORMAL LOW (ref 39.0–52.0)
HCT: 32 % — ABNORMAL LOW (ref 39.0–52.0)
HCT: 31 % — ABNORMAL LOW (ref 39.0–52.0)
Hemoglobin: 11.2 g/dL — ABNORMAL LOW (ref 13.0–17.0)
Hemoglobin: 10.9 g/dL — ABNORMAL LOW (ref 13.0–17.0)
Hemoglobin: 10.5 g/dL — ABNORMAL LOW (ref 13.0–17.0)
Hemoglobin: 10.9 g/dL — ABNORMAL LOW (ref 13.0–17.0)
Hemoglobin: 10.9 g/dL — ABNORMAL LOW (ref 13.0–17.0)
Hemoglobin: 10.5 g/dL — ABNORMAL LOW (ref 13.0–17.0)
O2 Saturation: 40 %
O2 Saturation: 53 %
O2 Saturation: 60 %
O2 Saturation: 58 %
O2 Saturation: 55 %
O2 Saturation: 63 %
Patient temperature: 98.7
Patient temperature: 98.7
Patient temperature: 98.5
Patient temperature: 98.5
Patient temperature: 97.9
Potassium: 3.4 meq/L — ABNORMAL LOW (ref 3.5–5.1)
Potassium: 3.4 meq/L — ABNORMAL LOW (ref 3.5–5.1)
Potassium: 3.3 meq/L — ABNORMAL LOW (ref 3.5–5.1)
Potassium: 3 meq/L — ABNORMAL LOW (ref 3.5–5.1)
Sodium: 139 mEq/L (ref 135–145)
Sodium: 140 meq/L (ref 135–145)
Sodium: 139 meq/L (ref 135–145)
Sodium: 141 meq/L (ref 135–145)
Sodium: 141 meq/L (ref 135–145)
TCO2: 39 mmol/L (ref 0–100)
TCO2: 37 mmol/L (ref 0–100)
TCO2: 41 mmol/L (ref 0–100)
TCO2: 39 mmol/L (ref 0–100)
TCO2: 42 mmol/L (ref 0–100)
TCO2: 44 mmol/L (ref 0–100)
pCO2, Ven: 55.6 mmHg — ABNORMAL HIGH (ref 45.0–50.0)
pCO2, Ven: 52.6 mmHg — ABNORMAL HIGH (ref 45.0–50.0)
pCO2, Ven: 53.7 mmHg — ABNORMAL HIGH (ref 45.0–50.0)
pCO2, Ven: 58 mmHg — ABNORMAL HIGH (ref 45.0–50.0)
pCO2, Ven: 58.4 mmHg — ABNORMAL HIGH (ref 45.0–50.0)
pCO2, Ven: 52.6 mmHg — ABNORMAL HIGH (ref 45.0–50.0)
pH, Ven: 7.472 — ABNORMAL HIGH (ref 7.250–7.300)
pH, Ven: 7.43 — ABNORMAL HIGH (ref 7.250–7.300)
pH, Ven: 7.477 — ABNORMAL HIGH (ref 7.250–7.300)
pH, Ven: 7.414 — ABNORMAL HIGH (ref 7.250–7.300)
pH, Ven: 7.449 — ABNORMAL HIGH (ref 7.250–7.300)
pH, Ven: 7.517 — ABNORMAL HIGH (ref 7.250–7.300)
pO2, Ven: 23 mmHg — CL (ref 30.0–45.0)
pO2, Ven: 28 mmHg — CL (ref 30.0–45.0)
pO2, Ven: 28 mmHg — CL (ref 30.0–45.0)
pO2, Ven: 30 mmHg (ref 30.0–45.0)
pO2, Ven: 31 mmHg (ref 30.0–45.0)
pO2, Ven: 33 mmHg (ref 30.0–45.0)
pO2, Ven: 27 mmHg — CL (ref 30.0–45.0)
pO2, Ven: 30 mmHg (ref 30.0–45.0)

## 2011-06-14 LAB — CBC
MCV: 94.9 fL (ref 78.0–100.0)
Platelets: 87 10*3/uL — ABNORMAL LOW (ref 150–400)
RBC: 3.16 MIL/uL — ABNORMAL LOW (ref 4.22–5.81)
RDW: 18.8 % — ABNORMAL HIGH (ref 11.5–15.5)
WBC: 26.2 10*3/uL — ABNORMAL HIGH (ref 4.0–10.5)

## 2011-06-14 LAB — RENAL FUNCTION PANEL
Albumin: 2 g/dL — ABNORMAL LOW (ref 3.5–5.2)
Albumin: 2 g/dL — ABNORMAL LOW (ref 3.5–5.2)
BUN: 28 mg/dL — ABNORMAL HIGH (ref 6–23)
CO2: 35 mEq/L — ABNORMAL HIGH (ref 19–32)
Calcium: 10.2 mg/dL (ref 8.4–10.5)
Calcium: 10.3 mg/dL (ref 8.4–10.5)
Chloride: 91 mEq/L — ABNORMAL LOW (ref 96–112)
Chloride: 92 mEq/L — ABNORMAL LOW (ref 96–112)
Creatinine, Ser: 1.53 mg/dL — ABNORMAL HIGH (ref 0.50–1.35)
Creatinine, Ser: 1.5 mg/dL — ABNORMAL HIGH (ref 0.50–1.35)
GFR calc Af Amer: 62 mL/min — ABNORMAL LOW (ref >90)
GFR calc non Af Amer: 54 mL/min — ABNORMAL LOW (ref >90)
GFR calc non Af Amer: 50 mL/min — ABNORMAL LOW (ref >90)
Glucose, Bld: 152 mg/dL — ABNORMAL HIGH (ref 70–99)
Phosphorus: 2 mg/dL — ABNORMAL LOW (ref 2.3–4.6)
Potassium: 3.3 mEq/L — ABNORMAL LOW (ref 3.5–5.1)
Potassium: 3 mEq/L — ABNORMAL LOW (ref 3.5–5.1)
Sodium: 137 mEq/L (ref 135–145)
Sodium: 139 mEq/L (ref 135–145)

## 2011-06-14 LAB — CULTURE, BLOOD (ROUTINE X 2): Culture  Setup Time: 201305121052

## 2011-06-14 LAB — GLUCOSE, CAPILLARY
Glucose-Capillary: 148 mg/dL — ABNORMAL HIGH (ref 70–99)
Glucose-Capillary: 132 mg/dL — ABNORMAL HIGH (ref 70–99)
Glucose-Capillary: 135 mg/dL — ABNORMAL HIGH (ref 70–99)
Glucose-Capillary: 146 mg/dL — ABNORMAL HIGH (ref 70–99)
Glucose-Capillary: 125 mg/dL — ABNORMAL HIGH (ref 70–99)

## 2011-06-14 LAB — BASIC METABOLIC PANEL
CO2: 36 mEq/L — ABNORMAL HIGH (ref 19–32)
Calcium: 10.2 mg/dL (ref 8.4–10.5)
GFR calc Af Amer: 62 mL/min — ABNORMAL LOW (ref >90)
GFR calc non Af Amer: 54 mL/min — ABNORMAL LOW (ref >90)
Sodium: 137 mEq/L (ref 135–145)

## 2011-06-14 LAB — CALCIUM, IONIZED: Calcium, Ion: 1.11 mmol/L — ABNORMAL LOW (ref 1.12–1.32)

## 2011-06-14 LAB — MAGNESIUM: Magnesium: 2 mg/dL (ref 1.5–2.5)

## 2011-06-14 MED ORDER — SODIUM PHOSPHATE 3 MMOLE/ML IV SOLN
30.0000 mmol | Freq: Once | INTRAVENOUS | Status: AC
  Administered 2011-06-14: 30 mmol via INTRAVENOUS
  Filled 2011-06-14: qty 10

## 2011-06-14 MED ORDER — POTASSIUM PHOSPHATE DIBASIC 3 MMOLE/ML IV SOLN
30.0000 mmol | Freq: Once | INTRAVENOUS | Status: DC
  Filled 2011-06-14: qty 10

## 2011-06-14 MED ORDER — SODIUM PHOSPHATE 3 MMOLE/ML IV SOLN
30.0000 mmol | Freq: Once | INTRAVENOUS | Status: AC
  Administered 2011-06-15: 30 mmol via INTRAVENOUS
  Filled 2011-06-14: qty 10

## 2011-06-14 MED ORDER — POTASSIUM CHLORIDE 10 MEQ/50ML IV SOLN: 10.0000 meq | INTRAVENOUS | Status: DC

## 2011-06-14 MED ORDER — POTASSIUM CHLORIDE 20 MEQ/15ML (10%) PO LIQD
40.0000 meq | Freq: Once | ORAL | Status: AC
  Administered 2011-06-14: 40 meq

## 2011-06-14 MED ORDER — POTASSIUM CHLORIDE 20 MEQ/15ML (10%) PO LIQD
ORAL | Status: AC
  Administered 2011-06-14: 40 meq
  Filled 2011-06-14: qty 30

## 2011-06-14 NOTE — Progress Notes (Signed)
Patient ID: Chad [name], male   DOB: Apr 14, 1955, 56 y.o.   MRN: [id number] Subjective:  Re-intubated due to respiratory distress. Awakens to verbal stimuli but somnolent.  Objective:  Vital Signs in the last 24 hours: Temp:  [97.3 F (36.3 C)-99 F (37.2 C)] 98.7 F (37.1 C) (05/18 0753) Pulse Rate:  [30-154] 61  (05/18 0700) Resp:  [13-26] 20  (05/18 0700) BP: (87-145)/(41-106) 107/62 mmHg (05/18 0700) SpO2:  [82 %-100 %] 100 % (05/18 0700) FiO2 (%):  [50 %-100 %] 50 % (05/18 0700) Weight:  [300 lb 11.3 oz (136.4 kg)] 300 lb 11.3 oz (136.4 kg) (05/18 0400)  Intake/Output from previous day: 05/17 0701 - 05/18 0700 In: 4188.9 [I.V.:2382.9; NG/GT:1020; IV Piggyback:786] Out: 6617 [Urine:31; Emesis/NG output:300; Stool:600] Intake/Output from this shift:    Physical Exam: ill appearing middle aged man, ventilator in place. HEENT: Unremarkable except  ETT tube in place. Neck:  9 cm JVD, no thyromegally Lungs:  Scatter rales bilaterally. No wheezes. HEART:  Regular rate rhythm, no murmurs, no rubs, no clicks Abd:  soft, positive bowel sounds, no organomegally, no rebound, no guarding Ext:  massive edema bilaterally, no cyanosis, no clubbing Skin:  No rashes no nodules Neuro:  CN II through XII intact, moves all extremities.  Lab Results:  Basename 06/14/11 0306 06/13/11 1627  WBC 26.2* 25.1*  HGB 9.3*11.2* 9.5*  PLT 87* 94*    Basename 06/14/11 0306 06/13/11 2200  NA [id number] 138  K 3.4*3.3*3.4* 3.2*  CL 91*91* 92*  CO2 36*35* 37*  GLUCOSE 154*152* 143*  BUN 28*28* 30*  CREATININE 1.44*1.44* 1.49*   No results found for this basename: TROPONINI:2,CK,MB:2 in the last 72 hours Hepatic Function Panel  Basename 06/14/11 0306  PROT --  ALBUMIN 1.9*  AST --  ALT --  ALKPHOS --  BILITOT --  BILIDIR --  IBILI --   No results found for this basename: CHOL in the last 72 hours No results found for this basename: PROTIME in the last 72  hours  Imaging: Dg Chest Port 1 View  06/13/2011  *RADIOLOGY REPORT*  Clinical Data: Intubated, shortness of breath  PORTABLE CHEST - 1 VIEW  Comparison: 06/13/2011  Findings: Endotracheal tube is appropriately positioned.  Feeding tube is in place, terminating below the level of the diaphragms, but not included in the field of view.  Right IJ central line tip terminates over the mid SVC.  Left IJ central line tip terminates over the mid SVC.  No pneumothorax.  Interval worsening of fluffy perihilar airspace opacities is noted.  Trace effusions are present.  Moderate cardiomegaly persists.  No pneumothorax.  IMPRESSION: Support apparatus appropriately positioned as above.  Interval worsening/development of fluffy perihilar airspace opacities compatible with alveolar filling process such as flash pulmonary edema, blood, pus, or cells.  Original Report Authenticated By: Harrel Lemon, M.D.   Dg Chest Port 1v Same Day  06/13/2011  *RADIOLOGY REPORT*  Clinical data:  Respiratory distress, intubation  PORTABLE CHEST ONE-VIEW:  Comparison:  Portable exam 1012 hours compared to 06/11/2011  Findings: Tip of endotracheal tube 1.3 cm above carina. Nasogastric tube and feeding tube extend into stomach. Bilateral jugular central venous catheters, tips project over SVC. Enlargement of cardiac silhouette with pulmonary vascular congestion. Decreased lung volumes with bibasilar atelectasis versus consolidation. Question perihilar edema. No pneumothorax.  IMPRESSION: Enlargement of cardiac silhouette with pulmonary vascular congestion. Bibasilar atelectasis versus consolidation and question minimal perihilar edema.  Original Report Authenticated By: Lollie Marrow, M.D.  Cardiac Studies: Tele - atrial flutter with an RVR Assessment/Plan:  1. VDRF 2. peristent atrial flutter with an RVR 3. Cardiomyopathy, unknown etiology, EF15%. 4. Massive volume overload on CVVHD REC: I suspect he will require additional  fluid removal before he can be extubated. Continue amiodarone for rate control. Atrial flutter is notoriously difficult to control with medical therapy in the setting of concommittant illness that increases adrenergic tone. I agree with plan for now not to cardiovert secondary to concern about showering emboli.  Lewayne Bunting, M.D.  LOS: 11 days    06/14/2011, 8:01 AM

## 2011-06-14 NOTE — Progress Notes (Signed)
INFECTIOUS DISEASE PROGRESS NOTE  ID: Chad [name] is a 56 y.o. 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 pressors have been weaned. He underwent extubation but then required reintubation last night. He is more alert this AM. C.diff assay negative, thus oral vanco discontinued.   Abtx:  ribavarin day #3 IVIG x 1 tx for hcap (vanco, piptazo, levo 5/07 --_  Medications:    . antiseptic oral rinse  15 mL Mouth Rinse QID  . chlorhexidine  15 mL Mouth Rinse BID  . collagenase   Topical Daily  . darbepoetin (ARANESP) injection - DIALYSIS  100 mcg Intravenous Q Wed-HD  . etomidate      . feeding supplement (OXEPA)  1,000 mL Per Tube Q24H  . feeding supplement  60 mL Per Tube QID  . hydrocortisone sod succinate (SOLU-CORTEF) injection  50 mg Intravenous Q12H  . IMMUNE GLOBLULIN (HUMAN) IV  0.5 g/kg Intravenous QODAY  . insulin aspart  0-4 Units Subcutaneous Q4H  . midazolam      . midazolam      . mulitivitamin  5 mL Per Tube Daily  . pantoprazole sodium  40 mg Per Tube BID  . potassium chloride  40 mEq Per Tube Once  . Ribavirin  400 mg Per Tube TID  . sirolimus  0.75 mg Oral Daily  . sodium phosphate  Dextrose 5% IVPB  30 mmol Intravenous Once  . sodium phosphate  Dextrose 5% IVPB  30 mmol Intravenous Once  . vecuronium      . DISCONTD: metronidazole  500 mg Intravenous Q8H  . DISCONTD: potassium chloride  10 mEq Intravenous Q1 Hr x 4  . DISCONTD: potassium phosphate IVPB (mmol)  30 mmol Intravenous Once  . DISCONTD: vancomycin  500 mg Per Tube Q6H    Objective: Vital signs in last 24 hours: Temp:  [97.3 F (36.3 C)-99 F (37.2 C)] 98.7 F (37.1 C) (05/18 0753) Pulse Rate:  [30-149] 133  (05/18 1132) Resp:  [13-26] 13  (05/18 1132) BP: (87-145)/(41-104) 124/47 mmHg (05/18  1100) SpO2:  [82 %-100 %] 97 % (05/18 1100) FiO2 (%):  [40 %-100 %] 40 % (05/18 1132) Weight:  [300 lb 11.3 oz (136.4 kg)] 300 lb 11.3 oz (136.4 kg) (05/18 0400) GEN: opens eyes to verbal stimulus. ill appearing middle aged man, ventilator in place.briddled dobhoff tube  HEENT: Unremarkable except ETT tube in place. Unable to see oral pharynx  Neck: elevatedJVD,  Lungs: Scatter rales bilaterally. No wheezes.  HEART: tachycardic, nl s1, s2, no gallops, murmurs, no rubs, no clicks  Abd: soft, positive bowel sounds, no organomegally, no rebound, no guarding  Ext: diffuse +3 and non-pitting edema bilaterally, upper and lower extremities, no cyanosis, no clubbing  Skin: No rashes no nodules    Lab Results  Basename 06/14/11 0837 06/14/11 0829 06/14/11 0306 06/13/11 2200 06/13/11 1627  WBC -- -- 26.2* -- 25.1*  HGB 10.5* 10.9* -- -- --  HCT 31.0* 32.0* -- -- --  NA 139 140 -- -- --  K 3.4* 3.4* -- -- --  CL -- -- 91*91* 92* --  CO2 -- -- 36*35* 37* --  BUN -- -- 28*28* 30* --  CREATININE -- -- 1.44*1.44* 1.49* --  GLU -- -- -- -- --   Liver Panel  Basename 06/14/11 0306 06/13/11 2200  PROT -- --  ALBUMIN 1.9* 2.0*  AST -- --  ALT -- --  ALKPHOS -- --  BILITOT -- --  BILIDIR -- --  IBILI -- --    Microbiology: 5/15: RVP: + PIV3 5/17 c.difficile NEGATIVE Studies/Results: Dg Chest Port 1 View  06/14/2011  *RADIOLOGY REPORT*  Clinical Data: Respiratory failure  PORTABLE CHEST - 1 VIEW  Comparison: 06/13/2011; 06/11/2011; 06/10/2011; 06/09/2011  Findings: Grossly unchanged enlarged cardiac silhouette and mediastinal contours.  Stable positioning of support apparatus.  No pneumothorax.  Overall improved aeration of the lungs with persistent heterogeneous air space opacities within the right mid lung.  Unchanged small bilateral effusions.  Unchanged bones.  IMPRESSION: 1.  Stable positioning of support apparatus.  No pneumothorax. 2.  Overall improved aeration of the lungs with  persistent right mid and upper lung heterogeneous air space opacities, infection versus asymmetric pulmonary edema.  Original Report Authenticated By: Waynard Reeds, M.D.   Dg Chest Port 1 View  06/13/2011  *RADIOLOGY REPORT*  Clinical Data: Intubated, shortness of breath  PORTABLE CHEST - 1 VIEW  Comparison: 06/13/2011  Findings: Endotracheal tube is appropriately positioned.  Feeding tube is in place, terminating below the level of the diaphragms, but not included in the field of view.  Right IJ central line tip terminates over the mid SVC.  Left IJ central line tip terminates over the mid SVC.  No pneumothorax.  Interval worsening of fluffy perihilar airspace opacities is noted.  Trace effusions are present.  Moderate cardiomegaly persists.  No pneumothorax.  IMPRESSION: Support apparatus appropriately positioned as above.  Interval worsening/development of fluffy perihilar airspace opacities compatible with alveolar filling process such as flash pulmonary edema, blood, pus, or cells.  Original Report Authenticated By: Harrel Lemon, M.D.   Dg Chest Port 1v Same Day  06/13/2011  *RADIOLOGY REPORT*  Clinical data:  Respiratory distress, intubation  PORTABLE CHEST ONE-VIEW:  Comparison:  Portable exam 1012 hours compared to 06/11/2011  Findings: Tip of endotracheal tube 1.3 cm above carina. Nasogastric tube and feeding tube extend into stomach. Bilateral jugular central venous catheters, tips project over SVC. Enlargement of cardiac silhouette with pulmonary vascular congestion. Decreased lung volumes with bibasilar atelectasis versus consolidation. Question perihilar edema. No pneumothorax.  IMPRESSION: Enlargement of cardiac silhouette with pulmonary vascular congestion. Bibasilar atelectasis versus consolidation and question minimal perihilar edema.  Original Report Authenticated By: Lollie Marrow, M.D.     Assessment/Plan: Parainfluenza respiratory infection in immunocompromised host with  respiratory failure s/p intubated, on day #3 of ribavarin.  Literature is sparse on various respiratory viral infections in renal transplant recipients. Ribavarin x 7 days +/- IVIG x QOD or methylpred x 3 days regimen mostly proven in RSV and less known if it can work with PIV or hMPV in solid organ transplant recipients. Given the severity of illness, it is worth trying a course of therapy.  Continue with current plan to use ribavarin daily and follow CBC for anemia side effects. Would give IVIG QOD during the course of 7days.   Gavyn Zoss Infectious Diseases 06/14/2011, 12:09 PM

## 2011-06-14 NOTE — Progress Notes (Signed)
Name: ALEXIS MIZUNO MRN: [id number] DOB: [birth year]  ELECTRONIC ICU PHYSICIAN NOTE  Problem:  K 3.2/ Phos 1.4   Intervention:  KPhos 30 mmol IV ordered  Sandrea Hughs 06/14/2011, 3:00 AM

## 2011-06-14 NOTE — Progress Notes (Signed)
ASSName: Chad [name] MRN: [id number] DOB: [birth year]    LOS: 11  PCCM  NOTE  History of Present Illness: [name] s/p renal Tx, immune suppressed, adm 5/08 to [facility] service with 3 days of cough, shortness of breath and fever. Progressive resp failure resulting in intubation 5/8.   Lines / Drains: R IJ HD cath 5/12 >> 5/13 (inadvertent removal) Aline 5/8>>>out R IJ HD cath 5/13 >>  Left IJ 5/8 >>  ETT 5/8 >>5/17>>>retubed 6 hrs>>>  Cultures: BAL bronch 5/8>>>ng    AFB >> neg   Viral panel >> POS parainfluenza virus   Pneumocystis >> neg   Fungus >> neg   HSV >> neg BC 5/7>>>neg  Blood X 2 5/12 >> neg  Antibiotics: (per ID) 5/7 levoflox>>>5/10 5/7 bactrim >>>5/10 5/7 micafungin >> 5/14 5/7 vanc >> 5/10 5/10 cefepime >> 5/15 5/15 Ribaviran/IVIG >>   Tests / Events: 5/8 hypoxic resp failure, intubated, near arrest 5/8 FOB - diffuse airway edema, diffuse bloody mucoid secretions 5/11 worsening shock , on sepsis protocol + dobut for low co-ox 5/12 Echocardiogram:  LVEF 15-20%. Diffuse HK 5/15 Family mtg: continue full aggressive support but NCB if arrests. To reassess in 4-5 days. If no progress, consider withdrawal at that time 5/15 Trial of Ribaviran/IVIG 5/16 Markedly improved - reduced pressors, improved cognition, gas exchange improved 5/17 continued improvement. Tolerates PS 5 cm H2O with Vt > 800cc 5/17- extubated, re intubated  Subj:   5/17- extubated, re intubated  Vital Signs: Temp:  [97.3 F (36.3 C)-99 F (37.2 C)] 98.7 F (37.1 C) (05/18 0753) Pulse Rate:  [30-149] 63  (05/18 0800) Resp:  [14-26] 20  (05/18 0800) BP: (87-145)/(41-104) 97/65 mmHg (05/18 0800) SpO2:  [82 %-100 %] 100 % (05/18 0800) FiO2 (%):  [50 %-100 %] 50 % (05/18 0800) Weight:  [136.4 kg (300 lb 11.3 oz)] 136.4 kg (300 lb 11.3 oz) (05/18 0400) I/O last 3 completed shifts: In: 6446 [I.V.:4127; NG/GT:1490; IV Piggyback:829] Out: 9091 [Urine:41; Emesis/NG output:300;  NWGNF:6213; Stool:900]  Physical Examination: General: RASS -1 Neuro:  Nonfocal HEENT:  Line clean Neck:  jvd improved from yesterday Cardiovascular:  s1 s2 RRR ST Lungs:  Clear anterior Abdomen:  Soft, bs wnl no r/g Lower ext- Anasarca  Ventilator settings: Vent Mode:  [-] PRVC FiO2 (%):  [50 %-100 %] 50 % Set Rate:  [20 bmp] 20 bmp Vt Set:  [600 mL] 600 mL PEEP:  [5 cmH20] 5 cmH20 Plateau Pressure:  [20 cmH20-23 cmH20] 22 cmH20  Labs and Imaging:   CXR: no new film CBC    Component Value Date/Time   WBC 26.2* 06/14/2011 0306   RBC 3.16* 06/14/2011 0306   HGB 10.5* 06/14/2011 0837   HCT 31.0* 06/14/2011 0837   PLT 87* 06/14/2011 0306   MCV 94.9 06/14/2011 0306   MCH 29.4 06/14/2011 0306   MCHC 31.0 06/14/2011 0306   RDW 18.8* 06/14/2011 0306   LYMPHSABS 1.0 06/07/2011 2105   MONOABS 0.3 06/07/2011 2105   EOSABS 0.0 06/07/2011 2105   BASOSABS 0.0 06/07/2011 2105    BMET    Component Value Date/Time   NA 139 06/14/2011 0837   K 3.4* 06/14/2011 0837   CL 91* 06/14/2011 0306   CL 91* 06/14/2011 0306   CO2 36* 06/14/2011 0306   CO2 35* 06/14/2011 0306   GLUCOSE 154* 06/14/2011 0306   GLUCOSE 152* 06/14/2011 0306   BUN 28* 06/14/2011 0306   BUN 28* 06/14/2011 0865  CREATININE 1.44* 06/14/2011 0306   CREATININE 1.44* 06/14/2011 0306   CALCIUM 10.2 06/14/2011 0306   CALCIUM 10.2 06/14/2011 0306   GFRNONAA 54* 06/14/2011 0306   GFRNONAA 54* 06/14/2011 0306   GFRAA 62* 06/14/2011 0306   GFRAA 62* 06/14/2011 0306     Assessment and Plan: 1. Pulmonary  - Acute respiratory failure  PNA, immunosuppressed Re intubated 5/17 Plan - - requires a peep 10 still, goal to peep 8 then 5 if able -pcxr improved after volume removal 2 lit neg -pcx r in am  -will need trach , will plan bedside Monday  -continue neg balance Bloody secretions, repeat coags, inr  2. CVS Cardiomyopathy of unclear etiology - suspect viral Shock - cardiogenic and/or septic - off pressors as of AM 5/17.   PSVT/A  fib/flutter/sinus tachy - Amiodarone started per cards.  - Low dose PRM metoprolol for tachycardia (keep HR < 115/min) - Wean pressors for MAP > 60 mmHg - Cont stress dose steroids, taper if remains off pressors  3. Renal Renal TX, ATN, acidosis & mild hyperkalemia - Cont CVVH per Renal - neg balance goal again  4. ID PNA Immunocompromised host Empiric HCAP   - abx Per ID - off antibacterials as of 5/15.  Discussed at length with ID 5/15. 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 Will recheck resp virus panel and begin these therapies  4. Heme Leukocytosis, thrombocytopenia - WBC improving. TCP stable 5/17 UGI bleeding from OGT resolved- cont off all heparins - SCDs for DVT prophy Trend WBC in am   5. Endo - Cont CBGs, SSI  6. MIld agitation - Cont daily WUA -required more sedation needs with re intubation and peep needs  7. GI/ Nutrition - not tolerating gastric feedings. - Tolerating TFs via post pyloric panda tube -ppi  8. H/o Hep C - monitor LFTs periodically   Best practices / Disposition: -->ICU status under PCCM -->full Rx but NCB if suffers cardiac arrest - would re-intubate if fails attempt at extubation -->SCDs -->Protonix for GI Px -->TFs   30 mins CCM time  Mcarthur Rossetti. Tyson Alias, MD, FACP Pgr: [phone] [facility]

## 2011-06-14 NOTE — Progress Notes (Signed)
Subjective:  Remains critically ill however pressors have been weaned.  He underwent extubation but then required reintubation last night.  He is more alert this AM.  Are making progress with volume removal with CRRT.  CRRT is running well.   Objective Vital signs in last 24 hours: Filed Vitals:   06/14/11 0600 06/14/11 0700 06/14/11 0753 06/14/11 0800  BP: 105/66 107/62  97/65  Pulse: 135 61  63  Temp:   98.7 F (37.1 C)   TempSrc:   Oral   Resp: 20 20  20   Height:      Weight:      SpO2: 100% 100%  100%   Weight change: -2.9 kg (-6 lb 6.3 oz)  Intake/Output Summary (Last 24 hours) at 06/14/11 0814 Last data filed at 06/14/11 0700  Gross per 24 hour  Intake 4011.17 ml  Output   6218 ml  Net -2206.83 ml   Labs: Basic Metabolic Panel:  Lab 06/14/11 9147 06/13/11 2200 06/13/11 1627  NA [id number] 138 137  K 3.4*3.3*3.4* 3.2* 3.3*  CL 91*91* 92* 91*  CO2 36*35* 37* 33*  GLUCOSE 154*152* 143* 175*  BUN 28*28* 30* 27*  CREATININE 1.44*1.44* 1.49* 1.38*  CALCIUM 10.210.2 10.1 10.0  ALB -- -- --  PHOS 1.5* 1.4* 1.3*   Liver Function Tests:  Lab 06/14/11 0306 06/13/11 2200 06/13/11 1627 06/08/11 0230  AST -- -- -- 85*  ALT -- -- -- 37  ALKPHOS -- -- -- 54  BILITOT -- -- -- 0.7  PROT -- -- -- 6.6  ALBUMIN 1.9* 2.0* 2.2* --   No results found for this basename: LIPASE:3,AMYLASE:3 in the last 168 hours No results found for this basename: AMMONIA:3 in the last 168 hours CBC:  Lab 06/14/11 0306 06/13/11 1627 06/13/11 0922 06/13/11 0344 06/12/11 2200 06/12/11 0345 06/07/11 2105  WBC 26.2* 25.1* -- 23.2* -- -- --  NEUTROABS -- -- -- -- -- -- 14.6*  HGB 9.3*11.2* 9.5* 10.9* -- -- -- --  HCT 30.0*33.0* 30.3* 32.0* -- -- -- --  MCV 94.9 94.7 -- 92.5 91.2 89.6 --  PLT 87* 94* -- 81* -- -- --   Cardiac Enzymes:  Lab 06/08/11 2345 06/08/11 1509 06/08/11 1000 06/07/11 2105  CKTOTAL 220 247* 246* 138  CKMB 6.7* 6.0* 5.7* 2.9  CKMBINDEX -- -- -- --  TROPONINI  0.81* 0.93* 0.59* <0.30   CBG:  Lab 06/14/11 0722 06/14/11 0409 06/14/11 0018 06/13/11 2006 06/13/11 1613  GLUCAP 132* 148* 135* 117* 143*    Iron Studies: No results found for this basename: IRON,TIBC,TRANSFERRIN,FERRITIN in the last 72 hours Studies/Results: Dg Chest Port 1 View  06/13/2011  *RADIOLOGY REPORT*  Clinical Data: Intubated, shortness of breath  PORTABLE CHEST - 1 VIEW  Comparison: 06/13/2011  Findings: Endotracheal tube is appropriately positioned.  Feeding tube is in place, terminating below the level of the diaphragms, but not included in the field of view.  Right IJ central line tip terminates over the mid SVC.  Left IJ central line tip terminates over the mid SVC.  No pneumothorax.  Interval worsening of fluffy perihilar airspace opacities is noted.  Trace effusions are present.  Moderate cardiomegaly persists.  No pneumothorax.  IMPRESSION: Support apparatus appropriately positioned as above.  Interval worsening/development of fluffy perihilar airspace opacities compatible with alveolar filling process such as flash pulmonary edema, blood, pus, or cells.  Original Report Authenticated By: Harrel Lemon, M.D.   Dg Chest Port 1v Same Day  06/13/2011  *RADIOLOGY  REPORT*  Clinical data:  Respiratory distress, intubation  PORTABLE CHEST ONE-VIEW:  Comparison:  Portable exam 1012 hours compared to 06/11/2011  Findings: Tip of endotracheal tube 1.3 cm above carina. Nasogastric tube and feeding tube extend into stomach. Bilateral jugular central venous catheters, tips project over SVC. Enlargement of cardiac silhouette with pulmonary vascular congestion. Decreased lung volumes with bibasilar atelectasis versus consolidation. Question perihilar edema. No pneumothorax.  IMPRESSION: Enlargement of cardiac silhouette with pulmonary vascular congestion. Bibasilar atelectasis versus consolidation and question minimal perihilar edema.  Original Report Authenticated By: Lollie Marrow, M.D.    Medications: Infusions:    . sodium chloride 20 mL/hr at 06/13/11 0700  . amiodarone (NEXTERONE PREMIX) 360 mg/200 mL dextrose 0.5 mg/min (06/14/11 0608)  . calcium gluconate infusion for CRRT 20 g (06/13/11 0700)  . fentaNYL infusion INTRAVENOUS 100 mcg/hr (06/13/11 1810)  . dialysis replacement fluid (prismasate) 200 mL/hr at 06/12/11 1710  . dialysate (PRISMASATE) 2,000 mL/hr at 06/14/11 0747  . sodium citrate 2 %/dextrose 2.5% solution 3000 mL 390 mL/hr at 06/14/11 0258  . DISCONTD: fentaNYL infusion INTRAVENOUS Stopped (06/13/11 1140)  . DISCONTD: milrinone Stopped (06/13/11 0945)  . DISCONTD: phenylephrine (NEO-SYNEPHRINE) Adult infusion Stopped (06/13/11 9604)    Scheduled Medications:    . antiseptic oral rinse  15 mL Mouth Rinse QID  . chlorhexidine  15 mL Mouth Rinse BID  . collagenase   Topical Daily  . darbepoetin (ARANESP) injection - DIALYSIS  100 mcg Intravenous Q Wed-HD  . etomidate      . feeding supplement (OXEPA)  1,000 mL Per Tube Q24H  . feeding supplement  60 mL Per Tube QID  . hydrocortisone sod succinate (SOLU-CORTEF) injection  50 mg Intravenous Q12H  . IMMUNE GLOBLULIN (HUMAN) IV  0.5 g/kg Intravenous QODAY  . insulin aspart  0-4 Units Subcutaneous Q4H  . midazolam      . midazolam      . mulitivitamin  5 mL Per Tube Daily  . pantoprazole sodium  40 mg Per Tube BID  . potassium chloride  40 mEq Per Tube Once  . Ribavirin  400 mg Per Tube TID  . sirolimus  0.75 mg Oral Daily  . sodium phosphate  Dextrose 5% IVPB  30 mmol Intravenous Once  . vecuronium      . DISCONTD: heparin subcutaneous  5,000 Units Subcutaneous Q8H  . DISCONTD: hydrocortisone sod succinate (SOLU-CORTEF) injection  50 mg Intravenous Q8H  . DISCONTD: metronidazole  500 mg Intravenous Q8H  . DISCONTD: potassium chloride  10 mEq Intravenous Q1 Hr x 4  . DISCONTD: potassium phosphate IVPB (mmol)  30 mmol Intravenous Once  . DISCONTD: vancomycin  500 mg Per Tube Q6H    have  reviewed scheduled and prn medications.  Physical Exam: General: critically ill on vent,  sedated.  Is more alert today Heart: tachy Lungs: CBS bilaterally Abdomen: slightly distended Extremities: chronic lymphedema but also pitting dependant edema Dialysis Access: R IJ cath placed 5/13    Assessment/ Plan: Pt is a 57 y.o. yo male s/p renal transplant who was admitted on 06/03/2011 with PNA  pulm HTN, volume overload now with acute on chronic renal failure requiring institution of CRRT Assessment/Plan: 1. PNA/cardiomyopathy/hypotension- per CCM and cards.  Requiring significant vent and hemodynamic support, although does seem slightly better.  ID is following , now thinking parainfluenza- on ribavirin 2. S/p renal transplant with CAN- now ARF-  Support with CRRT since 5/12.  Labs are stable,no disruption in treatment yesterday. continue  same dialysate and replacement fluids- on 4 K bath.   On citrate protocol and is tolerating well.  Regarding renal transplant, will continue his home dose of sirolimus and stress dose steroids which were decreased 5/15.  Baseline function was not great with creatinine of 2.  Minimal UOP, will watch 3. Anemia- hgb stable today.  Citrate protocol and aranesp 4. Secondary hyperparathyroidism- no treatment right now, will stop rocatrol as is not essential at this time and calcium up 5. HTN/volume very overloaded appearing still.  Tolerating volume removal and does seem to have responded 6. Hypophosphatemia-  Sorry I did not notice yesterday, I agree with repletion and have ordered another dose for tomorrow  Monique Gift A   06/14/2011,8:14 AM  LOS: 11 days

## 2011-06-14 NOTE — Progress Notes (Signed)
PT Cancellation Note  Treatment cancelled today due to medical issues with patient which prohibited therapy.  Will sign off given that patient was re-intubated and PT was discontinued.  Please reorder as appropriate.  Thanks.   [name],[name] 06/14/2011, 6:32 AM  [facility] [phone] (pager)

## 2011-06-15 ENCOUNTER — Inpatient Hospital Stay (HOSPITAL_COMMUNITY): Payer: BC Managed Care – PPO

## 2011-06-15 LAB — POCT I-STAT EG7
Acid-Base Excess: 13 mmol/L — ABNORMAL HIGH (ref 0.0–2.0)
Acid-Base Excess: 18 mmol/L — ABNORMAL HIGH (ref 0.0–2.0)
Acid-Base Excess: 12 mmol/L — ABNORMAL HIGH (ref 0.0–2.0)
Acid-Base Excess: 12 mmol/L — ABNORMAL HIGH (ref 0.0–2.0)
Bicarbonate: 39.1 mEq/L — ABNORMAL HIGH (ref 20.0–24.0)
Bicarbonate: 44.8 mEq/L — ABNORMAL HIGH (ref 20.0–24.0)
Bicarbonate: 38.4 mEq/L — ABNORMAL HIGH (ref 20.0–24.0)
Bicarbonate: 37.7 mEq/L — ABNORMAL HIGH (ref 20.0–24.0)
Calcium, Ion: 1.06 mmol/L — ABNORMAL LOW (ref 1.12–1.32)
Calcium, Ion: 0.41 mmol/L — CL (ref 1.12–1.32)
Calcium, Ion: 0.49 mmol/L — CL (ref 1.12–1.32)
HCT: 27 % — ABNORMAL LOW (ref 39.0–52.0)
HCT: 30 % — ABNORMAL LOW (ref 39.0–52.0)
HCT: 34 % — ABNORMAL LOW (ref 39.0–52.0)
HCT: 31 % — ABNORMAL LOW (ref 39.0–52.0)
HCT: 30 % — ABNORMAL LOW (ref 39.0–52.0)
Hemoglobin: 10.2 g/dL — ABNORMAL LOW (ref 13.0–17.0)
Hemoglobin: 11.6 g/dL — ABNORMAL LOW (ref 13.0–17.0)
Hemoglobin: 10.2 g/dL — ABNORMAL LOW (ref 13.0–17.0)
O2 Saturation: 59 %
O2 Saturation: 35 %
O2 Saturation: 31 %
O2 Saturation: 53 %
Patient temperature: 99
Patient temperature: 97.6
Patient temperature: 99
Patient temperature: 99
Potassium: 2.8 mEq/L — ABNORMAL LOW (ref 3.5–5.1)
Potassium: 3.2 mEq/L — ABNORMAL LOW (ref 3.5–5.1)
Potassium: 3.6 mEq/L (ref 3.5–5.1)
Sodium: 140 mEq/L (ref 135–145)
Sodium: 142 mEq/L (ref 135–145)
Sodium: 141 mEq/L (ref 135–145)
TCO2: 41 mmol/L (ref 0–100)
TCO2: 41 mmol/L (ref 0–100)
TCO2: 47 mmol/L (ref 0–100)
TCO2: 39 mmol/L (ref 0–100)
TCO2: 44 mmol/L (ref 0–100)
pCO2, Ven: 52.3 mmHg — ABNORMAL HIGH (ref 45.0–50.0)
pCO2, Ven: 57.6 mmHg — ABNORMAL HIGH (ref 45.0–50.0)
pCO2, Ven: 53.7 mmHg — ABNORMAL HIGH (ref 45.0–50.0)
pCO2, Ven: 56.6 mmHg — ABNORMAL HIGH (ref 45.0–50.0)
pH, Ven: 7.429 — ABNORMAL HIGH (ref 7.250–7.300)
pH, Ven: 7.44 — ABNORMAL HIGH (ref 7.250–7.300)
pH, Ven: 7.482 — ABNORMAL HIGH (ref 7.250–7.300)
pO2, Ven: 27 mmHg — CL (ref 30.0–45.0)
pO2, Ven: 20 mmHg — CL (ref 30.0–45.0)
pO2, Ven: 28 mmHg — CL (ref 30.0–45.0)

## 2011-06-15 LAB — BLOOD GAS, ARTERIAL
Acid-Base Excess: 14.3 mmol/L — ABNORMAL HIGH (ref 0.0–2.0)
Drawn by: 31101
FIO2: 0.4 %
RATE: 20 resp/min
TCO2: 39.7 mmol/L (ref 0–100)
pCO2 arterial: 45.8 mmHg — ABNORMAL HIGH (ref 35.0–45.0)
pO2, Arterial: 60 mmHg — ABNORMAL LOW (ref 80.0–100.0)

## 2011-06-15 LAB — RENAL FUNCTION PANEL
Albumin: 1.8 g/dL — ABNORMAL LOW (ref 3.5–5.2)
Albumin: 1.9 g/dL — ABNORMAL LOW (ref 3.5–5.2)
Albumin: 1.9 g/dL — ABNORMAL LOW (ref 3.5–5.2)
BUN: 27 mg/dL — ABNORMAL HIGH (ref 6–23)
CO2: 39 mEq/L — ABNORMAL HIGH (ref 19–32)
Calcium: 9.8 mg/dL (ref 8.4–10.5)
Chloride: 93 mEq/L — ABNORMAL LOW (ref 96–112)
GFR calc Af Amer: 57 mL/min — ABNORMAL LOW (ref >90)
GFR calc Af Amer: 59 mL/min — ABNORMAL LOW (ref >90)
GFR calc non Af Amer: 51 mL/min — ABNORMAL LOW (ref >90)
GFR calc non Af Amer: 49 mL/min — ABNORMAL LOW (ref >90)
GFR calc non Af Amer: 51 mL/min — ABNORMAL LOW (ref >90)
Glucose, Bld: 115 mg/dL — ABNORMAL HIGH (ref 70–99)
Phosphorus: 2.5 mg/dL (ref 2.3–4.6)
Phosphorus: 2 mg/dL — ABNORMAL LOW (ref 2.3–4.6)
Potassium: 3.2 mEq/L — ABNORMAL LOW (ref 3.5–5.1)
Potassium: 3 mEq/L — ABNORMAL LOW (ref 3.5–5.1)
Sodium: 138 mEq/L (ref 135–145)
Sodium: 138 mEq/L (ref 135–145)

## 2011-06-15 LAB — CBC
HCT: 27.4 % — ABNORMAL LOW (ref 39.0–52.0)
Hemoglobin: 8.3 g/dL — ABNORMAL LOW (ref 13.0–17.0)
MCV: 98.2 fL (ref 78.0–100.0)
Platelets: 69 10*3/uL — ABNORMAL LOW (ref 150–400)
RBC: 2.79 MIL/uL — ABNORMAL LOW (ref 4.22–5.81)
WBC: 18.5 10*3/uL — ABNORMAL HIGH (ref 4.0–10.5)

## 2011-06-15 LAB — GLUCOSE, CAPILLARY
Glucose-Capillary: 131 mg/dL — ABNORMAL HIGH (ref 70–99)
Glucose-Capillary: 132 mg/dL — ABNORMAL HIGH (ref 70–99)
Glucose-Capillary: 106 mg/dL — ABNORMAL HIGH (ref 70–99)
Glucose-Capillary: 119 mg/dL — ABNORMAL HIGH (ref 70–99)

## 2011-06-15 LAB — PROTIME-INR: Prothrombin Time: 15 seconds (ref 11.6–15.2)

## 2011-06-15 LAB — DIFFERENTIAL
Eosinophils Relative: 0 % (ref 0–5)
Lymphocytes Relative: 2 % — ABNORMAL LOW (ref 12–46)
Lymphs Abs: 0.4 10*3/uL — ABNORMAL LOW (ref 0.7–4.0)
Monocytes Relative: 10 % (ref 3–12)

## 2011-06-15 LAB — CALCIUM, IONIZED: Calcium, Ion: 1.15 mmol/L (ref 1.12–1.32)

## 2011-06-15 MED ORDER — ETOMIDATE 2 MG/ML IV SOLN
40.0000 mg | Freq: Once | INTRAVENOUS | Status: DC
  Filled 2011-06-15: qty 20

## 2011-06-15 MED ORDER — POTASSIUM CHLORIDE 20 MEQ/15ML (10%) PO LIQD
40.0000 meq | Freq: Every day | ORAL | Status: DC
  Administered 2011-06-16 – 2011-06-25 (×10): 40 meq via ORAL
  Filled 2011-06-15 (×9): qty 30

## 2011-06-15 MED ORDER — PROPOFOL 10 MG/ML IV EMUL
5.0000 ug/kg/min | Freq: Once | INTRAVENOUS | Status: AC
  Administered 2011-06-16: 10 ug/kg/min via INTRAVENOUS

## 2011-06-15 MED ORDER — PROPOFOL 10 MG/ML IV EMUL: 5.0000 ug/kg/min | Freq: Once | INTRAVENOUS | Status: DC

## 2011-06-15 MED ORDER — POTASSIUM CHLORIDE 20 MEQ/15ML (10%) PO LIQD
40.0000 meq | ORAL | Status: DC
  Filled 2011-06-15 (×2): qty 30

## 2011-06-15 MED ORDER — POTASSIUM CHLORIDE 20 MEQ/15ML (10%) PO LIQD
40.0000 meq | Freq: Once | ORAL | Status: AC
  Administered 2011-06-15: 40 meq
  Filled 2011-06-15: qty 30

## 2011-06-15 MED ORDER — MIDAZOLAM HCL 2 MG/2ML IJ SOLN: 5.0000 mg | Freq: Once | INTRAMUSCULAR | Status: DC

## 2011-06-15 MED ORDER — HYDROCORTISONE SOD SUCCINATE 100 MG IJ SOLR
25.0000 mg | Freq: Two times a day (BID) | INTRAMUSCULAR | Status: DC
  Administered 2011-06-15 – 2011-06-17 (×4): 25 mg via INTRAVENOUS
  Filled 2011-06-15 (×5): qty 0.5

## 2011-06-15 MED ORDER — MIDAZOLAM HCL 2 MG/2ML IJ SOLN
5.0000 mg | Freq: Once | INTRAMUSCULAR | Status: AC
  Administered 2011-06-16: 6 mg via INTRAVENOUS
  Filled 2011-06-15: qty 6

## 2011-06-15 MED ORDER — POTASSIUM CHLORIDE 20 MEQ/15ML (10%) PO LIQD
20.0000 meq | Freq: Once | ORAL | Status: AC
  Administered 2011-06-15: 20 meq

## 2011-06-15 MED ORDER — VECURONIUM BROMIDE 10 MG IV SOLR: 10.0000 mg | Freq: Once | INTRAVENOUS | Status: DC

## 2011-06-15 MED ORDER — SODIUM CHLORIDE 0.9 % IV SOLN
20.0000 ug | Freq: Once | INTRAVENOUS | Status: AC
  Administered 2011-06-16: 20 ug via INTRAVENOUS
  Filled 2011-06-15: qty 5

## 2011-06-15 MED ORDER — FENTANYL CITRATE 0.05 MG/ML IJ SOLN
200.0000 ug | Freq: Once | INTRAMUSCULAR | Status: AC
  Administered 2011-06-16: 200 ug via INTRAVENOUS

## 2011-06-15 MED ORDER — VECURONIUM BROMIDE 10 MG IV SOLR
10.0000 mg | Freq: Once | INTRAVENOUS | Status: AC
  Administered 2011-06-16: 10 mg via INTRAVENOUS
  Filled 2011-06-15: qty 10

## 2011-06-15 MED ORDER — FENTANYL CITRATE 0.05 MG/ML IJ SOLN: 200.0000 ug | Freq: Once | INTRAMUSCULAR | Status: DC

## 2011-06-15 NOTE — Progress Notes (Signed)
ASSName: RAYMON SCHLARB MRN: [id number] DOB: 06-19-1955    LOS: 12  PCCM  NOTE  History of Present Illness: [name] s/p renal Tx, immune suppressed, adm 5/08 to [facility] service with 3 days of cough, shortness of breath and fever. Progressive resp failure resulting in intubation 5/8.   Lines / Drains: R IJ HD cath 5/12 >> 5/13 (inadvertent removal) Aline 5/8>>>out R IJ HD cath 5/13 >>  Left IJ 5/8 >>  ETT 5/8 >>5/17>>>retubed 6 hrs>>>  Cultures: BAL bronch 5/8>>>ng    AFB >> neg   Viral panel >> POS parainfluenza virus   Pneumocystis >> neg   Fungus >> neg   HSV >> neg BC 5/7>>>neg  Blood X 2 5/12 >> neg  Antibiotics: (per ID) 5/7 levoflox>>>5/10 5/7 bactrim >>>5/10 5/7 micafungin >> 5/14 5/7 vanc >> 5/10 5/10 cefepime >> 5/15 5/15 Ribaviran/IVIG >>   Tests / Events: 5/8 hypoxic resp failure, intubated, near arrest 5/8 FOB - diffuse airway edema, diffuse bloody mucoid secretions 5/11 worsening shock , on sepsis protocol + dobut for low co-ox 5/12 Echocardiogram:  LVEF 15-20%. Diffuse HK 5/15 Family mtg: continue full aggressive support but NCB if arrests. To reassess in 4-5 days. If no progress, consider withdrawal at that time 5/15 Trial of Ribaviran/IVIG 5/16 Markedly improved - reduced pressors, improved cognition, gas exchange improved 5/17 continued improvement. Tolerates PS 5 cm H2O with Vt > 800cc 5/17- extubated, re intubated  Subj:   5/17- neg balance on cvvhd, tachy remains  Vital Signs: Temp:  [97.6 F (36.4 C)-99.3 F (37.4 C)] 98.9 F (37.2 C) (05/19 0804) Pulse Rate:  [56-139] 133  (05/19 0800) Resp:  [13-26] 20  (05/19 0800) BP: (95-164)/(47-100) 135/87 mmHg (05/19 0800) SpO2:  [93 %-100 %] 99 % (05/19 0800) FiO2 (%):  [40 %-50 %] 40 % (05/19 0800) Weight:  [130.3 kg (287 lb 4.2 oz)] 130.3 kg (287 lb 4.2 oz) (05/19 0400) I/O last 3 completed shifts: In: 5543.1 [I.V.:3526.1; NG/GT:1630; IV Piggyback:387] Out: 7829 [Urine:53; FAOZH:0865;  Stool:1100]  Physical Examination: General: RASS -1, awake Neuro:  Nonfocal HEENT:  Line clean Neck:  jvd lower Cardiovascular:  s1 s2 RRR ST Lungs:  Clear anterior, coarse bases Abdomen:  Soft, bs wnl no r/g Lower ext- Anasarca less  Ventilator settings: Vent Mode:  [-] PSV;CPAP FiO2 (%):  [40 %-50 %] 40 % Set Rate:  [15 bmp-20 bmp] 15 bmp Vt Set:  [600 mL] 600 mL PEEP:  [5 cmH20] 5 cmH20 Pressure Support:  [5 cmH20-10 cmH20] 5 cmH20 Plateau Pressure:  [16 cmH20-19 cmH20] 19 cmH20  Labs and Imaging:   CXR: no new film CBC    Component Value Date/Time   WBC 18.5* 06/15/2011 0514   RBC 2.79* 06/15/2011 0514   HGB 8.3* 06/15/2011 0514   HCT 27.4* 06/15/2011 0514   PLT 69* 06/15/2011 0514   MCV 98.2 06/15/2011 0514   MCH 29.7 06/15/2011 0514   MCHC 30.3 06/15/2011 0514   RDW 20.1* 06/15/2011 0514   LYMPHSABS 0.4* 06/15/2011 0514   MONOABS 1.8* 06/15/2011 0514   EOSABS 0.0 06/15/2011 0514   BASOSABS 0.1 06/15/2011 0514    BMET    Component Value Date/Time   NA 141 06/15/2011 0514   K 3.2* 06/15/2011 0514   CL 93* 06/15/2011 0514   CO2 37* 06/15/2011 0514   GLUCOSE 145* 06/15/2011 0514   BUN 27* 06/15/2011 0514   CREATININE 1.50* 06/15/2011 0514   CALCIUM 10.0 06/15/2011 0514  GFRNONAA 51* 06/15/2011 0514   GFRAA 59* 06/15/2011 0514     Assessment and Plan: 1. Pulmonary  - Acute respiratory failure  PNA, immunosuppressed Re intubated 5/17 Plan - - requires a peep 5, successful -pcxr improved slowly, still with residual ALI bilateral -pcx r in am  -will need trach , will plan bedside Monday, plan 11 am  -continue neg balance  2. CVS Cardiomyopathy of unclear etiology - suspect viral Shock - cardiogenic and/or septic - off pressors as of AM 5/17.   PSVT/A fib/flutter/sinus tachy - Amiodarone started per cards.  - Low dose PRM metoprolol for tachycardia (keep HR < 115/min) - steroids to 25 q12h as remain soff pressors I reviewed EP note  3. Renal Renal TX, ATN,  acidosis & mild hyperkalemia - Cont CVVH per Renal - neg balance goal again  4. ID PNA Immunocompromised host Empiric HCAP   - abx Per ID - off antibacterials as of 5/15.  Per ID Ribaviran and IVIG.  4. Heme Leukocytosis, thrombocytopenia - WBC improving. TCP stable 5/17 UGI bleeding from OGT resolved- cont off all heparins - SCDs for DVT prophy  5. Endo - Cont CBGs, SSI  6. MIld agitation - Cont daily WUA -upright position  7. GI/ Nutrition - not tolerating gastric feedings. - Tolerating TFs via post pyloric panda tube, NPO midnight -ppi  8. H/o Hep C - monitor LFTs periodically  Best practices / Disposition: -->ICU status under PCCM -->full Rx but NCB if suffers cardiac arrest - would re-intubate if fails attempt at extubation -->SCDs -->Protonix for GI Px -->TFs   30 mins CCM time  Mcarthur Rossetti. Tyson Alias, MD, FACP Pgr: [phone] [facility]

## 2011-06-15 NOTE — Progress Notes (Signed)
Subjective:  Remains critically ill however pressors have been weaned off.  He underwent extubation but then required reintubation last night on Friday.  He is still alert this AM.  Are making progress with volume removal with CRRT.  CRRT is running well.   Objective Vital signs in last 24 hours: Filed Vitals:   06/15/11 0400 06/15/11 0500 06/15/11 0600 06/15/11 0700  BP: 95/61 125/78 119/77 131/75  Pulse: 56 57 56 61  Temp: 99.3 F (37.4 C)     TempSrc: Oral     Resp: 24 22 16 16   Height:      Weight: 130.3 kg (287 lb 4.2 oz)     SpO2: 98% 100% 100% 100%   Weight change: -6.1 kg (-13 lb 7.2 oz)  Intake/Output Summary (Last 24 hours) at 06/15/11 0758 Last data filed at 06/15/11 0700  Gross per 24 hour  Intake 3853.8 ml  Output   5006 ml  Net -1152.2 ml   Labs: Basic Metabolic Panel:  Lab 06/15/11 4540 06/15/11 0505 06/15/11 0501 06/14/11 2249 06/14/11 1324  NA 141 142 143 -- --  K 3.2* 3.3* 2.8* -- --  CL 93* -- -- 91* 92*  CO2 37* -- -- 39* 39*  GLUCOSE 145* -- -- 143* 150*  BUN 27* -- -- 28* 27*  CREATININE 1.50* -- -- 1.50* 1.53*  CALCIUM 10.0 -- -- 10.1 10.3  ALB -- -- -- -- --  PHOS 2.3 -- -- 2.0* 2.5   Liver Function Tests:  Lab 06/15/11 0514 06/14/11 2249 06/14/11 1324  AST -- -- --  ALT -- -- --  ALKPHOS -- -- --  BILITOT -- -- --  PROT -- -- --  ALBUMIN 1.8* 2.0* 2.0*   No results found for this basename: LIPASE:3,AMYLASE:3 in the last 168 hours No results found for this basename: AMMONIA:3 in the last 168 hours CBC:  Lab 06/15/11 0514 06/15/11 0505 06/15/11 0501 06/14/11 0306 06/13/11 1627 06/13/11 0344 06/12/11 2200  WBC 18.5* -- -- 26.2* 25.1* -- --  NEUTROABS 16.2* -- -- -- -- -- --  HGB 8.3* 10.2* 9.2* -- -- -- --  HCT 27.4* 30.0* 27.0* -- -- -- --  MCV 98.2 -- -- 94.9 94.7 92.5 91.2  PLT 69* -- -- 87* 94* -- --   Cardiac Enzymes:  Lab 06/08/11 2345 06/08/11 1509 06/08/11 1000  CKTOTAL 220 247* 246*  CKMB 6.7* 6.0* 5.7*  CKMBINDEX -- --  --  TROPONINI 0.81* 0.93* 0.59*   CBG:  Lab 06/15/11 0339 06/15/11 0025 06/14/11 1934 06/14/11 1528 06/14/11 1142  GLUCAP 131* 140* 125* 146* 135*    Iron Studies: No results found for this basename: IRON,TIBC,TRANSFERRIN,FERRITIN in the last 72 hours Studies/Results: Dg Chest Port 1 View  06/15/2011  *RADIOLOGY REPORT*  Clinical Data: Evaluate endotracheal tube, pneumonia, ARDS, CHF  PORTABLE CHEST - 1 VIEW  Comparison: 06/14/2011; 06/13/2011; 06/11/2011  Findings: Grossly unchanged enlarged cardiac silhouette and mediastinal contours.  Stable positioning of support apparatus.  No definite pneumothorax.  Lung volumes remain persistently reduced. Pulmonary vasculature is indistinct with cephalization of flow. Grossly unchanged heterogeneous opacities within the right upper lung and bilateral lung bases.  Small bilateral pleural effusions suspected.  Unchanged bones.  IMPRESSION: 1.  Stable positioning of support apparatus.  No pneumothorax. 2.  Unchanged findings of most suggestive of asymmetric pulmonary edema with small bilateral effusions and bibasilar opacities, likely atelectasis though underlying infection is not excluded.  Original Report Authenticated By: Judene Companion.D.  Dg Chest Port 1 View  06/14/2011  *RADIOLOGY REPORT*  Clinical Data: Respiratory failure  PORTABLE CHEST - 1 VIEW  Comparison: 06/13/2011; 06/11/2011; 06/10/2011; 06/09/2011  Findings: Grossly unchanged enlarged cardiac silhouette and mediastinal contours.  Stable positioning of support apparatus.  No pneumothorax.  Overall improved aeration of the lungs with persistent heterogeneous air space opacities within the right mid lung.  Unchanged small bilateral effusions.  Unchanged bones.  IMPRESSION: 1.  Stable positioning of support apparatus.  No pneumothorax. 2.  Overall improved aeration of the lungs with persistent right mid and upper lung heterogeneous air space opacities, infection versus asymmetric pulmonary  edema.  Original Report Authenticated By: Waynard Reeds, M.D.   Dg Chest Port 1 View  06/13/2011  *RADIOLOGY REPORT*  Clinical Data: Intubated, shortness of breath  PORTABLE CHEST - 1 VIEW  Comparison: 06/13/2011  Findings: Endotracheal tube is appropriately positioned.  Feeding tube is in place, terminating below the level of the diaphragms, but not included in the field of view.  Right IJ central line tip terminates over the mid SVC.  Left IJ central line tip terminates over the mid SVC.  No pneumothorax.  Interval worsening of fluffy perihilar airspace opacities is noted.  Trace effusions are present.  Moderate cardiomegaly persists.  No pneumothorax.  IMPRESSION: Support apparatus appropriately positioned as above.  Interval worsening/development of fluffy perihilar airspace opacities compatible with alveolar filling process such as flash pulmonary edema, blood, pus, or cells.  Original Report Authenticated By: Harrel Lemon, M.D.   Dg Chest Port 1v Same Day  06/13/2011  *RADIOLOGY REPORT*  Clinical data:  Respiratory distress, intubation  PORTABLE CHEST ONE-VIEW:  Comparison:  Portable exam 1012 hours compared to 06/11/2011  Findings: Tip of endotracheal tube 1.3 cm above carina. Nasogastric tube and feeding tube extend into stomach. Bilateral jugular central venous catheters, tips project over SVC. Enlargement of cardiac silhouette with pulmonary vascular congestion. Decreased lung volumes with bibasilar atelectasis versus consolidation. Question perihilar edema. No pneumothorax.  IMPRESSION: Enlargement of cardiac silhouette with pulmonary vascular congestion. Bibasilar atelectasis versus consolidation and question minimal perihilar edema.  Original Report Authenticated By: Lollie Marrow, M.D.   Medications: Infusions:    . sodium chloride 20 mL/hr at 06/13/11 0700  . amiodarone (NEXTERONE PREMIX) 360 mg/200 mL dextrose 0.5 mg/min (06/15/11 0500)  . calcium gluconate infusion for CRRT 20 g  (06/14/11 1722)  . fentaNYL infusion INTRAVENOUS 100 mcg/hr (06/14/11 1600)  . dialysis replacement fluid (prismasate) 200 mL/hr at 06/14/11 2302  . dialysate (PRISMASATE) 2,000 mL/hr at 06/15/11 0335  . sodium citrate 2 %/dextrose 2.5% solution 3000 mL 420 mL/hr at 06/15/11 1610    Scheduled Medications:    . antiseptic oral rinse  15 mL Mouth Rinse QID  . chlorhexidine  15 mL Mouth Rinse BID  . collagenase   Topical Daily  . darbepoetin (ARANESP) injection - DIALYSIS  100 mcg Intravenous Q Wed-HD  . feeding supplement (OXEPA)  1,000 mL Per Tube Q24H  . feeding supplement  60 mL Per Tube QID  . hydrocortisone sod succinate (SOLU-CORTEF) injection  50 mg Intravenous Q12H  . IMMUNE GLOBLULIN (HUMAN) IV  0.5 g/kg Intravenous QODAY  . insulin aspart  0-4 Units Subcutaneous Q4H  . mulitivitamin  5 mL Per Tube Daily  . pantoprazole sodium  40 mg Per Tube BID  . potassium chloride  40 mEq Per Tube Once  . Ribavirin  400 mg Per Tube TID  . sirolimus  0.75 mg Oral  Daily  . sodium phosphate  Dextrose 5% IVPB  30 mmol Intravenous Once  . sodium phosphate  Dextrose 5% IVPB  30 mmol Intravenous Once  . DISCONTD: potassium chloride  40 mEq Per Tube Q4H    have reviewed scheduled and prn medications.  Physical Exam: General:   Is more alert today still on vent Heart: tachy Lungs: CBS bilaterally Abdomen: slightly distended Extremities: chronic lymphedema but also pitting dependant edema Dialysis Access: R IJ cath placed 5/13    Assessment/ Plan: Pt is a 56 y.o. yo male s/p renal transplant who was admitted on 06/03/2011 with PNA  pulm HTN, volume overload now with acute on chronic renal failure requiring institution of CRRT Assessment/Plan: 1. PNA/cardiomyopathy/hypotension- per CCM and cards.  Requiring significant vent and hemodynamic support, although does seem slightly better.  ID is following , now thinking parainfluenza- on ribavirin and IVIG  2. 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  Labs are stable,no disruption in treatment yesterday. continue same dialysate and replacement fluids- on 4 K bath but will add daily repletion as well.   On citrate protocol and is tolerating well.  Regarding renal transplant, will continue his home dose of sirolimus and stress dose steroids which were decreased 5/15.  Baseline function was not great with creatinine of 2.  Minimal UOP, will watch.  If still requiring HD support next week will need tunnelled cath.  May be able to transition over to intermittent HD soon as well.   3. Anemia- hgb stable today.  Citrate protocol and aranesp 4. Secondary hyperparathyroidism- no treatment right now, will stop rocatrol as is not essential at this time and calcium up 5. HTN/volume very overloaded appearing still.  Tolerating volume removal and does seem to have responded.  Will try to increase to 100 cc/hour UF with CRRT 6. Hypophosphatemia-   I agree with repletion and have ordered another dose for today  Ridley Dileo A   06/15/2011,7:58 AM  LOS: 12 days

## 2011-06-15 NOTE — Progress Notes (Addendum)
INFECTIOUS DISEASE PROGRESS NOTE  ID: Chad [name] is a 56 y.o. 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 Still undergoing CRRT   Abtx:  ribavarin day #4 IVIG x 1, 2nd dose on 5/19 -- pitpazo 5/08-5/12 Cefepime d/c'd 5/15 vanco last dosed 5/10 Mica d/c'd 5/13  Medications:    . antiseptic oral rinse  15 mL Mouth Rinse QID  . chlorhexidine  15 mL Mouth Rinse BID  . collagenase   Topical Daily  . darbepoetin (ARANESP) injection - DIALYSIS  100 mcg Intravenous Q Wed-HD  . feeding supplement (OXEPA)  1,000 mL Per Tube Q24H  . feeding supplement  60 mL Per Tube QID  . hydrocortisone sod succinate (SOLU-CORTEF) injection  50 mg Intravenous Q12H  . IMMUNE GLOBLULIN (HUMAN) IV  0.5 g/kg Intravenous QODAY  . insulin aspart  0-4 Units Subcutaneous Q4H  . mulitivitamin  5 mL Per Tube Daily  . pantoprazole sodium  40 mg Per Tube BID  . potassium chloride  40 mEq Per Tube Once  . potassium chloride  40 mEq Oral Daily  . Ribavirin  400 mg Per Tube TID  . sirolimus  0.75 mg Oral Daily  . sodium phosphate  Dextrose 5% IVPB  30 mmol Intravenous Once  . DISCONTD: potassium chloride  40 mEq Per Tube Q4H    Objective: Vital signs in last 24 hours: Temp:  [97.6 F (36.4 C)-99.3 F (37.4 C)] 98.9 F (37.2 C) (05/19 0804) Pulse Rate:  [56-139] 133  (05/19 0800) Resp:  [13-26] 20  (05/19 0800) BP: (95-164)/(47-100) 135/87 mmHg (05/19 0800) SpO2:  [93 %-100 %] 99 % (05/19 0800) FiO2 (%):  [40 %-50 %] 40 % (05/19 0800) Weight:  [287 lb 4.2 oz (130.3 kg)] 287 lb 4.2 oz (130.3 kg) (05/19 0400) GEN: opens eyes  to verbal stimulus. ill appearing middle aged man, ventilator in place.briddled dobhoff tube  HEENT: Unremarkable except ETT tube in place. Unable to see oral pharynx  Neck: elevatedJVD,  Lungs: Scatter rales bilaterally. No wheezes.  HEART: tachycardic, nl s1, s2, no gallops, murmurs, no rubs, no clicks  Abd: soft, positive bowel sounds, no organomegally, no rebound, no guarding  Ext: diffuse +3 and non-pitting edema bilaterally, upper and lower extremities, no cyanosis, no clubbing  Skin: No rashes no nodules    Lab Results  Basename 06/15/11 0514 06/15/11 0505 06/14/11 2249 06/14/11 0306  WBC 18.5* -- -- 26.2*  HGB 8.3* 10.2* -- --  HCT 27.4* 30.0* -- --  NA 141 142 -- --  K 3.2* 3.3* -- --  CL 93* -- 91* --  CO2 37* -- 39* --  BUN 27* -- 28* --  CREATININE 1.50* -- 1.50* --  GLU -- -- -- --   Liver Panel  Basename 06/15/11 0514 06/14/11 2249  PROT -- --  ALBUMIN 1.8* 2.0*  AST -- --  ALT -- --  ALKPHOS -- --  BILITOT -- --  BILIDIR -- --  IBILI -- --    Microbiology: 5/15: RVP: + PIV3 5/17 c.difficile NEGATIVE 5/08 blood cx NGTD  Studies/Results: 06/15/2011  *RADIOLOGY REPORT*  Clinical Data: Evaluate endotracheal tube, pneumonia, ARDS, CHF  PORTABLE CHEST -  1 VIEW  Comparison: 06/14/2011; 06/13/2011; 06/11/2011  Findings: Grossly unchanged enlarged cardiac silhouette and mediastinal contours.  Stable positioning of support apparatus.  No definite pneumothorax.  Lung volumes remain persistently reduced. Pulmonary vasculature is indistinct with cephalization of flow. Grossly unchanged heterogeneous opacities within the right upper lung and bilateral lung bases.  Small bilateral pleural effusions suspected.  Unchanged bones.  IMPRESSION: 1.  Stable positioning of support apparatus.  No pneumothorax. 2.  Unchanged findings of most suggestive of asymmetric pulmonary edema with small bilateral effusions and bibasilar opacities, likely atelectasis though underlying infection  is not excluded.  Assessment/Plan: 56yo Male, with renal allograft in 1996, cmv d?/r?  With parainfluenza respiratory infection  s/p intubation c/b aflutter & CHF triggered by viral respiratory infection, currently on day 4/7 ribavarin and IVIG QOD  Literature is sparse on treatment options for various respiratory viral infections in renal transplant recipients. Ribavarin x 7 days +/- IVIG x QOD or methylpred x 3 days regimen mostly proven in RSV infections and less known if it can work with PIV or hMPV in solid organ transplant recipients. Given the severity of illness, it is worth trying a course of therapy.  Continue with current plan to use ribavarin daily and follow CBC for anemia side effects. Would give IVIG QOD during the course of 7days.  Immunosuppression= per renal, currently on sirolimus 0.75mg  daily and hydrocortisone   Chad [name] Infectious Diseases 06/15/2011, 10:13 AM

## 2011-06-15 NOTE — Progress Notes (Signed)
Patient ID: Chad [name], male   DOB: [birth year], 56 y.o.   MRN: [id number] Subjective:  Much more awake and alert this morning.  Objective:  Vital Signs in the last 24 hours: Temp:  [97.6 F (36.4 C)-99.3 F (37.4 C)] 98.9 F (37.2 C) (05/19 0804) Pulse Rate:  [54-139] 133  (05/19 0800) Resp:  [13-26] 20  (05/19 0800) BP: (95-164)/(47-100) 135/87 mmHg (05/19 0800) SpO2:  [93 %-100 %] 99 % (05/19 0800) FiO2 (%):  [40 %-50 %] 40 % (05/19 0800) Weight:  [287 lb 4.2 oz (130.3 kg)] 287 lb 4.2 oz (130.3 kg) (05/19 0400)  Intake/Output from previous day: 05/18 0701 - 05/19 0700 In: 3853.8 [I.V.:2415.8; NG/GT:1180; IV Piggyback:258] Out: 5006 [Urine:53; Stool:700] Intake/Output from this shift:    Physical Exam: ill appearing middle aged man, ventilator in place awake and alert. HEENT: Unremarkable except  ETT tube in place. Neck:  7 cm JVD, no thyromegally Lungs:  Scatter rales bilaterally. No wheezes. HEART:  Regular tachy rhythm, no murmurs, no rubs, no clicks Abd:  soft, positive bowel sounds, no organomegally, no rebound, no guarding Ext:  massive edema bilaterally, no cyanosis, no clubbing Skin:  No rashes no nodules Neuro:  CN II through XII intact, moves all extremities.  Lab Results:  Basename 06/15/11 0514 06/15/11 0505 06/14/11 0306  WBC 18.5* -- 26.2*  HGB 8.3* 10.2* --  PLT 69* -- 87*    Basename 06/15/11 0514 06/15/11 0505 06/14/11 2249  NA 141 142 --  K 3.2* 3.3* --  CL 93* -- 91*  CO2 37* -- 39*  GLUCOSE 145* -- 143*  BUN 27* -- 28*  CREATININE 1.50* -- 1.50*   No results found for this basename: TROPONINI:2,CK,MB:2 in the last 72 hours Hepatic Function Panel  Basename 06/15/11 0514  PROT --  ALBUMIN 1.8*  AST --  ALT --  ALKPHOS --  BILITOT --  BILIDIR --  IBILI --   No results found for this basename: CHOL in the last 72 hours No results found for this basename: PROTIME in the last 72 hours  Imaging: Dg Chest Port 1 View  06/15/2011   *RADIOLOGY REPORT*  Clinical Data: Evaluate endotracheal tube, pneumonia, ARDS, CHF  PORTABLE CHEST - 1 VIEW  Comparison: 06/14/2011; 06/13/2011; 06/11/2011  Findings: Grossly unchanged enlarged cardiac silhouette and mediastinal contours.  Stable positioning of support apparatus.  No definite pneumothorax.  Lung volumes remain persistently reduced. Pulmonary vasculature is indistinct with cephalization of flow. Grossly unchanged heterogeneous opacities within the right upper lung and bilateral lung bases.  Small bilateral pleural effusions suspected.  Unchanged bones.  IMPRESSION: 1.  Stable positioning of support apparatus.  No pneumothorax. 2.  Unchanged findings of most suggestive of asymmetric pulmonary edema with small bilateral effusions and bibasilar opacities, likely atelectasis though underlying infection is not excluded.  Original Report Authenticated By: Waynard Reeds, M.D.   Dg Chest Port 1 View  06/14/2011  *RADIOLOGY REPORT*  Clinical Data: Respiratory failure  PORTABLE CHEST - 1 VIEW  Comparison: 06/13/2011; 06/11/2011; 06/10/2011; 06/09/2011  Findings: Grossly unchanged enlarged cardiac silhouette and mediastinal contours.  Stable positioning of support apparatus.  No pneumothorax.  Overall improved aeration of the lungs with persistent heterogeneous air space opacities within the right mid lung.  Unchanged small bilateral effusions.  Unchanged bones.  IMPRESSION: 1.  Stable positioning of support apparatus.  No pneumothorax. 2.  Overall improved aeration of the lungs with persistent right mid and upper lung heterogeneous air space opacities,  infection versus asymmetric pulmonary edema.  Original Report Authenticated By: Waynard Reeds, M.D.   Dg Chest Port 1 View  06/13/2011  *RADIOLOGY REPORT*  Clinical Data: Intubated, shortness of breath  PORTABLE CHEST - 1 VIEW  Comparison: 06/13/2011  Findings: Endotracheal tube is appropriately positioned.  Feeding tube is in place, terminating below  the level of the diaphragms, but not included in the field of view.  Right IJ central line tip terminates over the mid SVC.  Left IJ central line tip terminates over the mid SVC.  No pneumothorax.  Interval worsening of fluffy perihilar airspace opacities is noted.  Trace effusions are present.  Moderate cardiomegaly persists.  No pneumothorax.  IMPRESSION: Support apparatus appropriately positioned as above.  Interval worsening/development of fluffy perihilar airspace opacities compatible with alveolar filling process such as flash pulmonary edema, blood, pus, or cells.  Original Report Authenticated By: Harrel Lemon, M.D.   Dg Chest Port 1v Same Day  06/13/2011  *RADIOLOGY REPORT*  Clinical data:  Respiratory distress, intubation  PORTABLE CHEST ONE-VIEW:  Comparison:  Portable exam 1012 hours compared to 06/11/2011  Findings: Tip of endotracheal tube 1.3 cm above carina. Nasogastric tube and feeding tube extend into stomach. Bilateral jugular central venous catheters, tips project over SVC. Enlargement of cardiac silhouette with pulmonary vascular congestion. Decreased lung volumes with bibasilar atelectasis versus consolidation. Question perihilar edema. No pneumothorax.  IMPRESSION: Enlargement of cardiac silhouette with pulmonary vascular congestion. Bibasilar atelectasis versus consolidation and question minimal perihilar edema.  Original Report Authenticated By: Lollie Marrow, M.D.    Cardiac Studies: Tele - atrial flutter with an RVR Assessment/Plan:  1. VDRF 2. peristent atrial flutter with an RVR 3. Cardiomyopathy, unknown etiology, EF15%. 4. Massive volume overload on CRRT REC: I suspect he will require additional fluid removal before he can be extubated. Continue amiodarone for rate control. Atrial flutter is notoriously difficult to control with medical therapy in the setting of concommittant illness that increases adrenergic tone. I am concerned that his abiltiy to extubate will  require either an improvement in his rate control or a return to NSR. TEE guided DCCV might accomplish this though he would be at risk for recurrent atrial flutter. Ultimately, catheter ablation may be required to maintain him in NSR.  Lewayne Bunting, M.D.  LOS: 12 days    06/15/2011, 8:47 AM

## 2011-06-16 ENCOUNTER — Inpatient Hospital Stay (HOSPITAL_COMMUNITY): Payer: BC Managed Care – PPO

## 2011-06-16 DIAGNOSIS — R6520 Severe sepsis without septic shock: Secondary | ICD-10-CM

## 2011-06-16 LAB — POCT I-STAT EG7
Acid-Base Excess: 12 mmol/L — ABNORMAL HIGH (ref 0.0–2.0)
Acid-Base Excess: 18 mmol/L — ABNORMAL HIGH (ref 0.0–2.0)
Acid-Base Excess: 11 mmol/L — ABNORMAL HIGH (ref 0.0–2.0)
Bicarbonate: 38 mEq/L — ABNORMAL HIGH (ref 20.0–24.0)
Bicarbonate: 43.4 mEq/L — ABNORMAL HIGH (ref 20.0–24.0)
Calcium, Ion: 0.37 mmol/L — CL (ref 1.12–1.32)
Calcium, Ion: 1.06 mmol/L — ABNORMAL LOW (ref 1.12–1.32)
Calcium, Ion: 0.4 mmol/L — CL (ref 1.12–1.32)
HCT: 29 % — ABNORMAL LOW (ref 39.0–52.0)
HCT: 32 % — ABNORMAL LOW (ref 39.0–52.0)
HCT: 31 % — ABNORMAL LOW (ref 39.0–52.0)
Hemoglobin: 9.9 g/dL — ABNORMAL LOW (ref 13.0–17.0)
Hemoglobin: 10.5 g/dL — ABNORMAL LOW (ref 13.0–17.0)
O2 Saturation: 44 %
Patient temperature: 98.4
Patient temperature: 98.7
Potassium: 3.6 mEq/L (ref 3.5–5.1)
Potassium: 3.6 mEq/L (ref 3.5–5.1)
Sodium: 141 mEq/L (ref 135–145)
Sodium: 141 mEq/L (ref 135–145)
TCO2: 46 mmol/L (ref 0–100)
pCO2, Ven: 58.9 mmHg — ABNORMAL HIGH (ref 45.0–50.0)
pCO2, Ven: 56.4 mmHg — ABNORMAL HIGH (ref 45.0–50.0)
pH, Ven: 7.435 — ABNORMAL HIGH (ref 7.250–7.300)
pH, Ven: 7.485 — ABNORMAL HIGH (ref 7.250–7.300)
pO2, Ven: 24 mmHg — CL (ref 30.0–45.0)
pO2, Ven: 18 mmHg — CL (ref 30.0–45.0)

## 2011-06-16 LAB — CBC
HCT: 27.8 % — ABNORMAL LOW (ref 39.0–52.0)
MCH: 29.8 pg (ref 26.0–34.0)
MCHC: 29.1 g/dL — ABNORMAL LOW (ref 30.0–36.0)
MCV: 102.2 fL — ABNORMAL HIGH (ref 78.0–100.0)
Platelets: 70 10*3/uL — ABNORMAL LOW (ref 150–400)
RDW: 21 % — ABNORMAL HIGH (ref 11.5–15.5)
WBC: 17.7 10*3/uL — ABNORMAL HIGH (ref 4.0–10.5)

## 2011-06-16 LAB — GLUCOSE, CAPILLARY
Glucose-Capillary: 156 mg/dL — ABNORMAL HIGH (ref 70–99)
Glucose-Capillary: 130 mg/dL — ABNORMAL HIGH (ref 70–99)
Glucose-Capillary: 144 mg/dL — ABNORMAL HIGH (ref 70–99)
Glucose-Capillary: 111 mg/dL — ABNORMAL HIGH (ref 70–99)

## 2011-06-16 LAB — CALCIUM, IONIZED: Calcium, Ion: 1.09 mmol/L — ABNORMAL LOW (ref 1.12–1.32)

## 2011-06-16 LAB — RENAL FUNCTION PANEL
CO2: 40 mEq/L (ref 19–32)
Calcium: 9.8 mg/dL (ref 8.4–10.5)
Calcium: 9.7 mg/dL (ref 8.4–10.5)
Creatinine, Ser: 1.45 mg/dL — ABNORMAL HIGH (ref 0.50–1.35)
Creatinine, Ser: 1.41 mg/dL — ABNORMAL HIGH (ref 0.50–1.35)
GFR calc Af Amer: 62 mL/min — ABNORMAL LOW (ref >90)
GFR calc Af Amer: 64 mL/min — ABNORMAL LOW (ref >90)
GFR calc non Af Amer: 53 mL/min — ABNORMAL LOW (ref >90)
GFR calc non Af Amer: 55 mL/min — ABNORMAL LOW (ref >90)
Phosphorus: 2.4 mg/dL (ref 2.3–4.6)
Phosphorus: 2.9 mg/dL (ref 2.3–4.6)
Sodium: 140 mEq/L (ref 135–145)
Sodium: 141 mEq/L (ref 135–145)

## 2011-06-16 MED ORDER — PRISMASOL BGK 4/2.5 32-4-2.5 MEQ/L IV SOLN
INTRAVENOUS | Status: DC
  Administered 2011-06-16 – 2011-06-21 (×11): via INTRAVENOUS_CENTRAL
  Filled 2011-06-16 (×19): qty 5000

## 2011-06-16 MED ORDER — SODIUM PHOSPHATE 3 MMOLE/ML IV SOLN
20.0000 mmol | Freq: Once | INTRAVENOUS | Status: AC
  Administered 2011-06-16: 20 mmol via INTRAVENOUS
  Filled 2011-06-16: qty 6.67

## 2011-06-16 MED ORDER — PRISMASOL BGK 4/2.5 32-4-2.5 MEQ/L IV SOLN
INTRAVENOUS | Status: DC
  Administered 2011-06-16 – 2011-06-20 (×5): via INTRAVENOUS_CENTRAL
  Filled 2011-06-16 (×8): qty 5000

## 2011-06-16 MED ORDER — FENTANYL CITRATE 0.05 MG/ML IJ SOLN
25.0000 ug | INTRAMUSCULAR | Status: AC | PRN
  Administered 2011-06-16 (×3): 50 ug via INTRAVENOUS
  Filled 2011-06-16 (×3): qty 2

## 2011-06-16 NOTE — Progress Notes (Signed)
INFECTIOUS DISEASE PROGRESS NOTE  ID: Chad [name] is a 56 y.o. male with   Principal Problem:  *Septic shock Active Problems:  HCAP (healthcare-associated pneumonia)  Thrombocytopenia  Acute respiratory failure  ARDS (adult respiratory distress syndrome)  Renal transplant disorder  Acute systolic heart failure  Acute renal failure  Cardiomyopathy  Subjective: On vent, awake and alert. Comfortable.  Abtx:  Anti-infectives     Start     Dose/Rate Route Frequency Ordered Stop   06/13/11 1200   vancomycin (VANCOCIN) 50 mg/mL oral solution 500 mg  Status:  Discontinued        500 mg Per Tube 4 times per day 06/13/11 1045 06/13/11 1600   06/13/11 1200   metroNIDAZOLE (FLAGYL) IVPB 500 mg  Status:  Discontinued        500 mg 100 mL/hr over 60 Minutes Intravenous Every 8 hours 06/13/11 1045 06/13/11 1600   06/12/11 1000   Ribavirin SOLN 400 mg     Comments: Special order item.       400 mg Per Tube 3 times daily 06/11/11 1346     06/11/11 1600   ribavirin (REBETOL) capsule 400 mg  Status:  Discontinued        400 mg Oral 3 times daily 06/11/11 1239 06/11/11 1343   06/08/11 2200   ceFEPIme (MAXIPIME) 2 g in dextrose 5 % 50 mL IVPB     Comments: Use Cefepime 1 g IV q12h for CrCl< 60 mL/min      2 g 100 mL/hr over 30 Minutes Intravenous Every 12 hours 06/08/11 1536 06/11/11 2320   06/08/11 0800   piperacillin-tazobactam (ZOSYN) IVPB 3.375 g  Status:  Discontinued        3.375 g 12.5 mL/hr over 240 Minutes Intravenous Every 8 hours 06/08/11 0149 06/08/11 1155   06/08/11 0600   micafungin (MYCAMINE) 100 mg in sodium chloride 0.9 % 100 mL IVPB  Status:  Discontinued        100 mg 100 mL/hr over 1 Hours Intravenous Daily 06/08/11 0308 06/10/11 1703   06/08/11 0200  piperacillin-tazobactam (ZOSYN) IVPB 3.375 g       3.375 g 100 mL/hr over 30 Minutes Intravenous  Once 06/08/11 0149 06/08/11 0254   06/06/11 0600   vancomycin (VANCOCIN) 1,750 mg in sodium chloride 0.9 % 500 mL  IVPB  Status:  Discontinued        1,750 mg 250 mL/hr over 120 Minutes Intravenous Every 24 hours 06/05/11 0906 06/08/11 0403   06/05/11 2200   levofloxacin (LEVAQUIN) IVPB 750 mg  Status:  Discontinued     Comments: Use Levaquin 750 mg IV q48h for CrCl < 60mL/min      750 mg 100 mL/hr over 90 Minutes Intravenous Every 48 hours 06/04/11 0344 06/05/11 0908   06/05/11 1000   levofloxacin (LEVAQUIN) IVPB 750 mg  Status:  Discontinued     Comments: Use Levaquin 750 mg IV q48h for CrCl < 42mL/min      750 mg 100 mL/hr over 90 Minutes Intravenous Every 24 hours 06/05/11 0908 06/06/11 0908   06/04/11 1200   sulfamethoxazole-trimethoprim (BACTRIM) 664 mg in dextrose 5 % 500 mL IVPB  Status:  Discontinued        20 mg/kg/day  132.7 kg 361 mL/hr over 90 Minutes Intravenous 4 times per day 06/04/11 1039 06/06/11 0908   06/04/11 1100   micafungin (MYCAMINE) 100 mg in sodium chloride 0.9 % 100 mL IVPB  Status:  Discontinued  100 mg 100 mL/hr over 1 Hours Intravenous Daily 06/04/11 1030 06/06/11 0908   06/04/11 0600   ceFEPIme (MAXIPIME) 1 g in dextrose 5 % 50 mL IVPB  Status:  Discontinued     Comments: Use Cefepime 1 g IV q12h for CrCl< 60 mL/min      1 g 100 mL/hr over 30 Minutes Intravenous Every 12 hours 06/04/11 0344 06/08/11 1536   06/04/11 0600   vancomycin (VANCOCIN) 1,500 mg in sodium chloride 0.9 % 500 mL IVPB  Status:  Discontinued        1,500 mg 250 mL/hr over 120 Minutes Intravenous Every 24 hours 06/04/11 0425 06/05/11 0906   06/04/11 0430   levofloxacin (LEVAQUIN) IVPB 750 mg        750 mg 100 mL/hr over 90 Minutes Intravenous  Once 06/04/11 0354 06/04/11 0632   06/03/11 2330   vancomycin (VANCOCIN) IVPB 1000 mg/200 mL premix        1,000 mg 200 mL/hr over 60 Minutes Intravenous  Once 06/03/11 2326 06/04/11 0051   06/03/11 2330  piperacillin-tazobactam (ZOSYN) IVPB 3.375 g       3.375 g 12.5 mL/hr over 240 Minutes Intravenous  Once 06/03/11 2326 06/04/11 0521           Medications:  Scheduled:   . antiseptic oral rinse  15 mL Mouth Rinse QID  . chlorhexidine  15 mL Mouth Rinse BID  . collagenase   Topical Daily  . darbepoetin (ARANESP) injection - DIALYSIS  100 mcg Intravenous Q Wed-HD  . desmopressin (DDAVP) IV  20 mcg Intravenous Once  . etomidate  40 mg Intravenous Once  . feeding supplement (OXEPA)  1,000 mL Per Tube Q24H  . feeding supplement  60 mL Per Tube QID  . fentaNYL  200 mcg Intravenous Once  . hydrocortisone sod succinate (SOLU-CORTEF) injection  25 mg Intravenous Q12H  . IMMUNE GLOBLULIN (HUMAN) IV  0.5 g/kg Intravenous QODAY  . insulin aspart  0-4 Units Subcutaneous Q4H  . midazolam  5 mg Intravenous Once  . mulitivitamin  5 mL Per Tube Daily  . pantoprazole sodium  40 mg Per Tube BID  . potassium chloride  20 mEq Per Tube Once  . potassium chloride  40 mEq Oral Daily  . potassium chloride  40 mEq Per Tube Once  . propofol  5-70 mcg/kg/min Intravenous Once  . Ribavirin  400 mg Per Tube TID  . sirolimus  0.75 mg Oral Daily  . sodium phosphate  Dextrose 5% IVPB  20 mmol Intravenous Once  . sodium phosphate  Dextrose 5% IVPB  30 mmol Intravenous Once  . vecuronium  10 mg Intravenous Once  . DISCONTD: etomidate  40 mg Intravenous Once  . DISCONTD: fentaNYL  200 mcg Intravenous Once  . DISCONTD: hydrocortisone sod succinate (SOLU-CORTEF) injection  50 mg Intravenous Q12H  . DISCONTD: midazolam  5 mg Intravenous Once  . DISCONTD: propofol  5-70 mcg/kg/min Intravenous Once  . DISCONTD: vecuronium  10 mg Intravenous Once    Objective: Vital signs in last 24 hours: Temp:  [97.6 F (36.4 C)-100.5 F (38.1 C)] 98.7 F (37.1 C) (05/20 0845) Pulse Rate:  [94-132] 121  (05/20 0900) Resp:  [14-36] 23  (05/20 0900) BP: (115-154)/(72-127) 128/91 mmHg (05/20 0900) SpO2:  [92 %-100 %] 96 % (05/20 0900) FiO2 (%):  [40 %-50 %] 40 % (05/20 0900) Weight:  [127.3 kg (280 lb 10.3 oz)] 127.3 kg (280 lb 10.3 oz) (05/20 0600)   General  appearance: alert, cooperative and mild distress Resp: rhonchi bilaterally Cardio: regular rate and rhythm GI: normal findings: bowel sounds normal and soft, non-tender  Lab Results  Basename 06/16/11 0540 06/16/11 0533 06/16/11 0500 06/15/11 2200 06/15/11 0514  WBC -- -- 17.7* -- 18.5*  HGB 9.9* 9.5* -- -- --  HCT 29.0* 28.0* -- -- --  NA 141 141 -- -- --  K 3.5 3.6 -- -- --  CL -- -- 92* 91* --  CO2 -- -- 39* 39* --  BUN -- -- 29* 29* --  CREATININE -- -- 1.45* 1.51* --  GLU -- -- -- -- --   Liver Panel  Basename 06/16/11 0500 06/15/11 2200  PROT -- --  ALBUMIN 1.8* 1.9*  AST -- --  ALT -- --  ALKPHOS -- --  BILITOT -- --  BILIDIR -- --  IBILI -- --   Sedimentation Rate No results found for this basename: ESRSEDRATE in the last 72 hours C-Reactive Protein No results found for this basename: CRP:2 in the last 72 hours  Microbiology: Recent Results (from the past 240 hour(s))  CULTURE, BLOOD (ROUTINE X 2)     Status: Normal   Collection Time   06/08/11  4:15 AM      Component Value Range Status Comment   Specimen Description BLOOD LEFT HAND   Final    Special Requests BOTTLES DRAWN AEROBIC ONLY 10CC   Final    Culture  Setup Time [id number]   Final    Culture NO GROWTH 5 DAYS   Final    Report Status 06/14/2011 FINAL   Final   CULTURE, BLOOD (ROUTINE X 2)     Status: Normal   Collection Time   06/08/11  4:20 AM      Component Value Range Status Comment   Specimen Description BLOOD LEFT HAND   Final    Special Requests BOTTLES DRAWN AEROBIC ONLY 10CC   Final    Culture  Setup Time [id number]   Final    Culture NO GROWTH 5 DAYS   Final    Report Status 06/14/2011 FINAL   Final   RESPIRATORY VIRUS PANEL (18 COMPONENTS)     Status: Abnormal   Collection Time   06/11/11  8:33 PM      Component Value Range Status Comment   Source - RVPAN BRONCHIAL ALVEOLAR LAVAGE   Final    Respiratory Syncytial Virus A NOT DETECTED   Final    Respiratory Syncytial Virus B NOT  DETECTED   Final    Influenza A NOT DETECTED   Final    Influenza B NOT DETECTED   Final    Parainfluenza 1 NOT DETECTED   Final    Parainfluenza 2 NOT DETECTED   Final    Parainfluenza 3 DETECTED (*)  Final    Parainfluenza 4 NOT DETECTED   Final    Metapneumovirus NOT DETECTED   Final    Coxsackie and Echovirus NOT DETECTED   Final    Rhinovirus NOT DETECTED   Final    Adenovirus B NOT DETECTED   Final    Adenovirus E NOT DETECTED   Final    CoronavirusNL63 NOT DETECTED   Final    CoronavirusHKU1 NOT DETECTED   Final    Coronavirus229E NOT DETECTED   Final    CoronavirusOC43 NOT DETECTED   Final   CLOSTRIDIUM DIFFICILE BY PCR     Status: Normal   Collection Time   06/13/11  1:14 PM  Component Value Range Status Comment   C difficile by pcr NEGATIVE  NEGATIVE  Final     Studies/Results: Dg Chest Port 1 View  06/15/2011  *RADIOLOGY REPORT*  Clinical Data: Evaluate endotracheal tube, pneumonia, ARDS, CHF  PORTABLE CHEST - 1 VIEW  Comparison: 06/14/2011; 06/13/2011; 06/11/2011  Findings: Grossly unchanged enlarged cardiac silhouette and mediastinal contours.  Stable positioning of support apparatus.  No definite pneumothorax.  Lung volumes remain persistently reduced. Pulmonary vasculature is indistinct with cephalization of flow. Grossly unchanged heterogeneous opacities within the right upper lung and bilateral lung bases.  Small bilateral pleural effusions suspected.  Unchanged bones.  IMPRESSION: 1.  Stable positioning of support apparatus.  No pneumothorax. 2.  Unchanged findings of most suggestive of asymmetric pulmonary edema with small bilateral effusions and bibasilar opacities, likely atelectasis though underlying infection is not excluded.  Original Report Authenticated By: Waynard Reeds, M.D.     Assessment/Plan: Renal Txp (sirolimus, hydrocortisone)   On CVVHD Pneumonia (parainfluenza), viral CM?    For trach today Day 5/7 ribavirin, IVIg Off anbx Will cont to  watch for viral PNA and CM. Suspect his increased WBC is from steroids, had low grade temp last pm though. He is certainly at risk for hospital acquired infections (making 1l of watery stool/day). C diff pcr (-) 06-13-11.     Chad [name] Infectious Diseases [phone] 06/16/2011, 9:36 AM   LOS: 13 days

## 2011-06-16 NOTE — Progress Notes (Addendum)
CRITICAL VALUE ALERT  Critical value received: CO2 40  Date of notification: 06/16/2011  Time of notification:  1635  Critical value read back:yes  Nurse who received alert:  Nancy Nordmann, RN  MD notified (1st page):  Dr. Camille Bal, MD  Time of first page:  1637  MD notified (2nd page):  Time of second page:  Responding MD:  Dr. Camille Bal, MD  Time MD responded:  [id number]  (on unit)  Rec'd order to Stop Citrate Protocol and use 4/2.5 Prismasate for pre and post replacement.

## 2011-06-16 NOTE — Progress Notes (Signed)
Utilization Review Completed.Chad [name] T5/20/2013   

## 2011-06-16 NOTE — Progress Notes (Signed)
Chad [name] Progress Note  Subjective:  Pt is quite alert Getting FFP in anticipation of trach later this morning - to be done in the room Remains on amio and off pressors On CRRT with citrate anticoagulation - current goal negative 100/hour Making minimal urine (60 ml)  Objective Filed Vitals:   06/16/11 0700 06/16/11 0753 06/16/11 0811 06/16/11 0815  BP: 143/82 143/82  133/91  Pulse: 120 95  120  Temp:   98.1 F (36.7 C) 98.1 F (36.7 C)  TempSrc:   Oral   Resp: 22 25  20   Height:      Weight:      SpO2: 99% 100%     Wt Readings from Last 3 Encounters:  06/16/11 127.3 kg (280 lb 10.3 oz)  I/O last 3 completed shifts: In: 6209.2 [I.V.:3541.2; NG/GT:1710; IV Piggyback:958] Out: 9652 [Urine:60; UJWJX:9147; Stool:2120]    Physical Exam BP 133/91  Pulse 120  Temp(Src) 98.1 F (36.7 C) (Oral)  Resp 20  Ht 5\' 11"  (1.803 m)  Wt 127.3 kg (280 lb 10.3 oz)  BMI 39.14 kg/m2  SpO2 100% General: On Vent, but wide awake and writing messages; secretions blood tinged Heart:Tachy S1S2 no S3 Lungs:coarse BS Abdomen:Obese, large scar right lower quadrant No focal tenderness Extremities:3-4+ edema pitting Dialysis Access: right IJ temp cath placed 06/09/11  Additional Objective Labs: Basic Metabolic Panel:  Lab 06/16/11 8295 06/16/11 0533 06/16/11 0500 06/15/11 2200 06/15/11 1430  NA 141 141 140 -- --  K 3.5 3.6 3.5 -- --  CL -- -- 92* 91* 91*  CO2 -- -- 39* 39* 39*  GLUCOSE -- -- 133* 118* 115*  BUN -- -- 29* 29* 26*  CREATININE -- -- 1.45* 1.51* 1.55*  CALCIUM -- -- 9.8 9.8 9.8  ALB -- -- -- -- --  PHOS -- -- 2.4 2.0* 2.5   Liver Function Tests:  Lab 06/16/11 0500 06/15/11 2200 06/15/11 1430  AST -- -- --  ALT -- -- --  ALKPHOS -- -- --  BILITOT -- -- --  PROT -- -- --  ALBUMIN 1.8* 1.9* 1.9*   Lab 06/16/11 0540 06/16/11 0533 06/16/11 0500 06/15/11 0514 06/14/11 0306 06/13/11 1627 06/13/11 0344  WBC -- -- 17.7* 18.5* 26.2* -- --  NEUTROABS --  -- -- 16.2* -- -- --  HGB 9.9* 9.5* 8.1* -- -- -- --  HCT 29.0* 28.0* 27.8* -- -- -- --  MCV -- -- 102.2* 98.2 94.9 94.7 92.5  PLT -- -- 70* 69* 87* -- --   Blood Culture    Component Value Date/Time   SDES BLOOD LEFT HAND 06/08/2011 0420   SPECREQUEST BOTTLES DRAWN AEROBIC ONLY 10CC 06/08/2011 0420   CULT NO GROWTH 5 DAYS 06/08/2011 0420   REPTSTATUS 06/14/2011 FINAL 06/08/2011 0420    Cardiac Enzymes: No results found for this basename: CKTOTAL:5,CKMB:5,CKMBINDEX:5,TROPONINI:5 in the last 168 hours CBG:  Lab 06/16/11 0335 06/16/11 0002 06/15/11 1937 06/15/11 1613 06/15/11 1122  GLUCAP 135* 142* 119* 106* 132*   Iron Studies: No results found for this basename: IRON,TIBC,TRANSFERRIN,FERRITIN in the last 72 hours Studies/Results: Dg Chest Port 1 View  06/15/2011  *RADIOLOGY REPORT*  Clinical Data: Evaluate endotracheal tube, pneumonia, ARDS, CHF  PORTABLE CHEST - 1 VIEW  Comparison: 06/14/2011; 06/13/2011; 06/11/2011  Findings: Grossly unchanged enlarged cardiac silhouette and mediastinal contours.  Stable positioning of support apparatus.  No definite pneumothorax.  Lung volumes remain persistently reduced. Pulmonary vasculature is indistinct with cephalization of flow. Grossly unchanged heterogeneous opacities within  the right upper lung and bilateral lung bases.  Small bilateral pleural effusions suspected.  Unchanged bones.  IMPRESSION: 1.  Stable positioning of support apparatus.  No pneumothorax. 2.  Unchanged findings of most suggestive of asymmetric pulmonary edema with small bilateral effusions and bibasilar opacities, likely atelectasis though underlying infection is not excluded.  Original Report Authenticated By: Waynard Reeds, M.D.   Medications:  . sodium chloride 10 mL/hr at 06/16/11 0459  . amiodarone (NEXTERONE PREMIX) 360 mg/200 mL dextrose 0.5 mg/min (06/16/11 0600)  . calcium gluconate infusion for CRRT 20 g (06/15/11 1324)  . fentaNYL infusion INTRAVENOUS 100 mcg/hr  (06/15/11 1900)  . dialysis replacement fluid (prismasate) 200 mL/hr at 06/16/11 0215  . dialysate (PRISMASATE) 2,000 mL/hr at 06/16/11 0619  . sodium citrate 2 %/dextrose 2.5% solution 3000 mL 450 mL/hr at 06/16/11 0124   . antiseptic oral rinse  15 mL Mouth Rinse QID  . chlorhexidine  15 mL Mouth Rinse BID  . collagenase   Topical Daily  . darbepoetin (ARANESP) injection - DIALYSIS  100 mcg Intravenous Q Wed-HD  . desmopressin (DDAVP) IV  20 mcg Intravenous Once  . etomidate  40 mg Intravenous Once  . feeding supplement (OXEPA)  1,000 mL Per Tube Q24H  . feeding supplement  60 mL Per Tube QID  . fentaNYL  200 mcg Intravenous Once  . hydrocortisone sod succinate (SOLU-CORTEF) injection  25 mg Intravenous Q12H  . IMMUNE GLOBLULIN (HUMAN) IV  0.5 g/kg Intravenous QODAY  . insulin aspart  0-4 Units Subcutaneous Q4H  . midazolam  5 mg Intravenous Once  . mulitivitamin  5 mL Per Tube Daily  . pantoprazole sodium  40 mg Per Tube BID  . potassium chloride  20 mEq Per Tube Once  . potassium chloride  40 mEq Oral Daily  . potassium chloride  40 mEq Per Tube Once  . propofol  5-70 mcg/kg/min Intravenous Once  . Ribavirin  400 mg Per Tube TID  . sirolimus  0.75 mg Oral Daily  . sodium phosphate  Dextrose 5% IVPB  30 mmol Intravenous Once  . vecuronium  10 mg Intravenous Once  . DISCONTD: etomidate  40 mg Intravenous Once  . DISCONTD: fentaNYL  200 mcg Intravenous Once  . DISCONTD: hydrocortisone sod succinate (SOLU-CORTEF) injection  50 mg Intravenous Q12H  . DISCONTD: midazolam  5 mg Intravenous Once  . DISCONTD: propofol  5-70 mcg/kg/min Intravenous Once  . DISCONTD: vecuronium  10 mg Intravenous Once    I  have reviewed scheduled and prn medications.  Assessment/ Plan:  Pt is a 56 y.o. yo male s/p renal transplant who was admitted on 06/03/2011 with PNA pulm HTN, volume overload now with acute on chronic renal failure requiring institution of CRRT   1. PNA/cardiomyopathy/hypotension-  per CCM and cards. Requiring vent support; for trach; hemodynamically more stable (off pressors) still with quite a bit of volume excess;  now thinking parainfluenza- on ribavirin and IVIG  2. 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 Citrate protocol.  4K dialysate and daily supplement.  Labs stable.  BP much better but still quite a lot of volume on board by exam.  Would like to keep CRRT going for another 24 hours at least, with a slightly more negative fluid goal if he will tolerate with plans to transition next couple of days to IHD.Marland Kitchen Regarding renal transplant, continuing his home dose of sirolimus and stress dose steroids which  were decreased 5/15. Baseline function was not great with creatinine of 2. Minimal UOP, may not recover. Will need tunnelled catheter this week if possible.   3. Anemia- hgb stable today. Citrate protocol and aranesp  4. Secondary hyperparathyroidism- no treatment right now, rocaltrol stopped -  not essential at this time and calcium up  5. HTN/volume very overloaded appearing still. Tolerating volume removal and does seem to have responded. Will try to increase to 150 cc/hour UF with CRRT  6. Hypophosphatemia- has been requiring repletion; 30 mmoles yesterday still around 2.4  Order additional 20 today    06/16/2011,8:24 AM  LOS: 13 days

## 2011-06-16 NOTE — Procedures (Signed)
Name:  Chad [name] MRN:  [id number] DOB:  02-28-1955  PROCEDURE NOTE  Procedure(s): Flexible bronchoscopy [phone])  Indications:  VDRF, tracheostomy.  Consent:  Procedure, benefits, risks and alternatives discussed.  Questions answered.  Consent obtained.  Anesthesia:  Propofol / Versed / Fentanyl  Procedure summary:  Appropriate equipment was assembled.  The patient was identified as Chad [name].  Safety timeout was performed.  After the appropriate level of anesthesia was assured, flexible video bronchoscope was lubricated and inserted through the endotracheal tube.  Airway examination was performed bilaterally to subsegmental level.  Minimal clear secretions were noted, and no endobronchial lesions were identified.  Dilatational percutaneous tracheostomy was then performed by Dr. Tyson Alias under bronchoscopic guidance (separate note will be dictated). The scope was then inserted via the tracheostomy tube and hemostasis assured. The bronchoscope was then withdrawn. Post-procedure chest x-ray was ordered.  Specimens sent: None   Complications:  No immediate complications were noted.  Hemodynamic parameters and oxygenation remained stable throughout the procedure.  Estimated blood loss:  Less then 5 mL.  Orlean Bradford, M.D. Pulmonary and Critical Care Medicine [facility] Cell: [phone]  06/16/2011, 12:03 PM

## 2011-06-16 NOTE — Progress Notes (Signed)
Called with CO2 of 40.  Last pH was 7.53.   Stop citrate protocol. No anticoagulation with CRRT for now.  Merinda Victorino B

## 2011-06-16 NOTE — Procedures (Signed)
Bedside Tracheostomy Insertion Procedure Note   Patient Details:   Name: Chad [name] DOB: 12/19/1955 MRN: [id number]  Procedure: Tracheostomy  Pre Procedure Assessment: ET Tube Size: ET Tube secured at lip (cm): Bite block in place: Yes Breath Sounds: Clear  Post Procedure Assessment: BP 79/51  Pulse 116  Temp(Src) 98.7 F (37.1 C) (Oral)  Resp 15  Ht 5\' 11"  (1.803 m)  Wt 280 lb 10.3 oz (127.3 kg)  BMI 39.14 kg/m2  SpO2 90% O2 sats: stable throughout Complications: No apparent complications Patient did tolerate procedure well Tracheostomy Brand:Shiley Tracheostomy Style:Cuffed Tracheostomy Size: 8 Tracheostomy Secured WUJ:[id number], velcro Tracheostomy Placement Confirmation:Trach cuff visualized and in place, chest xray taken for placement    Early Ord Ann 06/16/2011, 12:22 PM

## 2011-06-16 NOTE — Procedures (Signed)
Perc trach Size 8 placed See full dictation Blood loss less 3 cc Plat, ddavp prior  Mcarthur Rossetti. Tyson Alias, MD, FACP Pgr: [phone] [place] [facility]

## 2011-06-16 NOTE — Progress Notes (Signed)
Speech/language Pathology  Order received for Passy-Muir valve; pt underwent trach placement within the hour - cuffed and on vent. Will follow for readiness.  Chad [name] L. Samson Frederic, [place] CCC/SLP Pager [phone]

## 2011-06-16 NOTE — Progress Notes (Signed)
ASSName: Chad [name] MRN: [id number] DOB: Jan 01, 1956    LOS: 13  PCCM  NOTE  History of Present Illness: [name] s/p renal Tx, immune suppressed, adm 5/08 to [facility] service with 3 days of cough, shortness of breath and fever. Progressive resp failure resulting in intubation 5/8.   Lines / Drains: R IJ HD cath 5/12 >> 5/13 (inadvertent removal) Aline 5/8>>>out R IJ HD cath 5/13 >>  Left IJ 5/8 >>  ETT 5/8 >>5/17>>>retubed 6 hrs>>>5/20 Trach 5/20 (df)>>>  Cultures: BAL bronch 5/8>>>ng    AFB >> neg   Viral panel >> POS parainfluenza virus   Pneumocystis >> neg   Fungus >> neg   HSV >> neg BC 5/7>>>neg  Blood X 2 5/12 >> neg  Antibiotics: (per ID) 5/7 levoflox>>>5/10 5/7 bactrim >>>5/10 5/7 micafungin >> 5/14 5/7 vanc >> 5/10 5/10 cefepime >> 5/15 5/15 Ribaviran/IVIG >>   Tests / Events: 5/8 hypoxic resp failure, intubated, near arrest 5/8 FOB - diffuse airway edema, diffuse bloody mucoid secretions 5/11 worsening shock , on sepsis protocol + dobut for low co-ox 5/12 Echocardiogram:  LVEF 15-20%. Diffuse HK 5/15 Family mtg: continue full aggressive support but NCB if arrests. To reassess in 4-5 days. If no progress, consider withdrawal at that time 5/15 Trial of Ribaviran/IVIG 5/16 Markedly improved - reduced pressors, improved cognition, gas exchange improved 5/17 continued improvement. Tolerates PS 5 cm H2O with Vt > 800cc 5/17- extubated, re intubated  Subj:   Ddavp, plat for trach, neg balance  Vital Signs: Temp:  [98.1 F (36.7 C)-100.5 F (38.1 C)] 98.7 F (37.1 C) (05/20 0845) Pulse Rate:  [76-131] 116  (05/20 1200) Resp:  [14-36] 15  (05/20 1200) BP: (79-154)/(51-127) 79/51 mmHg (05/20 1200) SpO2:  [86 %-100 %] 90 % (05/20 1200) FiO2 (%):  [40 %-60 %] 60 % (05/20 1200) Weight:  [127.3 kg (280 lb 10.3 oz)] 127.3 kg (280 lb 10.3 oz) (05/20 0600) I/O last 3 completed shifts: In: 6209.2 [I.V.:3541.2; NG/GT:1710; IV Piggyback:958] Out: 9652  [Urine:60; WUJWJ:1914; Stool:2120]  Physical Examination: General: RASS -1, awake Neuro:  Nonfocal HEENT:  Line clean Neck:  jvd flat, obese neck slight Cardiovascular:  s1 s2 RRR ST Lungs:   coarse bases Abdomen:  Soft, bs wnl no r/g Lower ext- Anasarca mno longer, has edema lowers  Ventilator settings: Vent Mode:  [-] PRVC FiO2 (%):  [40 %-60 %] 60 % Set Rate:  [15 bmp-20 bmp] 20 bmp Vt Set:  [600 mL] 600 mL PEEP:  [5 cmH20] 5 cmH20 Pressure Support:  [8 cmH20] 8 cmH20 Plateau Pressure:  [14 cmH20-21 cmH20] 18 cmH20  Labs and Imaging:   CXR: no new film CBC    Component Value Date/Time   WBC 17.7* 06/16/2011 0500   RBC 2.72* 06/16/2011 0500   HGB 9.9* 06/16/2011 0540   HCT 29.0* 06/16/2011 0540   PLT 70* 06/16/2011 0500   MCV 102.2* 06/16/2011 0500   MCH 29.8 06/16/2011 0500   MCHC 29.1* 06/16/2011 0500   RDW 21.0* 06/16/2011 0500   LYMPHSABS 0.4* 06/15/2011 0514   MONOABS 1.8* 06/15/2011 0514   EOSABS 0.0 06/15/2011 0514   BASOSABS 0.1 06/15/2011 0514    BMET    Component Value Date/Time   NA 141 06/16/2011 0540   K 3.5 06/16/2011 0540   CL 92* 06/16/2011 0500   CO2 39* 06/16/2011 0500   GLUCOSE 133* 06/16/2011 0500   BUN 29* 06/16/2011 0500   CREATININE 1.45* 06/16/2011 0500  CALCIUM 9.8 06/16/2011 0500   GFRNONAA 53* 06/16/2011 0500   GFRAA 62* 06/16/2011 0500     Assessment and Plan: 1. Pulmonary  - Acute respiratory failure  PNA, immunosuppressed Re intubated 5/17 Plan - -continue neg balance -has residual ARDS likely -wean this am cpap 5ps 5, trach done after -post trach consider trach collar traidl -regardeless of volume status, will have residual infiltrates to some extent  2. CVS Cardiomyopathy of unclear etiology - suspect viral Shock - cardiogenic and/or septic - off pressors as of AM 5/17.   PSVT/A fib/flutter/sinus tachy - Amiodarone started per cards.  - Low dose PRM metoprolol for tachycardia (keep HR < 115/min) - steroids for kidney only at this  point  3. Renal Renal TX, ATN, acidosis & mild hyperkalemia - Cont CVVH per Renal - neg balance goal again  4. ID PNA Immunocompromised host Empiric HCAP   - abx Per ID - off antibacterials as of 5/15.  Per ID Ribaviran, stop date? S/p IVIG, stop date planned? Can have renal insuff contribution in setting Tx failure  4. Heme Leukocytosis, thrombocytopenia - WBC improving. TCP stable 5/17 UGI bleeding from OGT resolved- cont off all heparins - SCDs for DVT prophy ddavp and plat prior, cbc in am  Trach with limited bleeding  5. Endo - Cont CBGs, SSI  6. MIld agitation - Cont daily WUA -upright position Dc continuous fent after trach if able Pt/ot, cvvhd pending  7. GI/ Nutrition - not tolerating gastric feedings. - \TF held for trach -ppi -lft in am on ribovirun Hold off peg, hope will avoid  8. H/o Hep C - monitor LFTs periodically lft in am   Best practices / Disposition: -->ICU status under PCCM -->full Rx but NCB if suffers cardiac arrest - would re-intubate if fails attempt at extubation -->SCDs -->Protonix for GI Px -->TFs Updated son   30 mins CCM time  Mcarthur Rossetti. Tyson Alias, MD, FACP Pgr: [phone] [place] [facility]

## 2011-06-17 ENCOUNTER — Inpatient Hospital Stay (HOSPITAL_COMMUNITY): Payer: BC Managed Care – PPO

## 2011-06-17 LAB — DIFFERENTIAL
Basophils Absolute: 0 10*3/uL (ref 0.0–0.1)
Basophils Absolute: 0 10*3/uL (ref 0.0–0.1)
Basophils Relative: 0 % (ref 0–1)
Eosinophils Absolute: 0 10*3/uL (ref 0.0–0.7)
Eosinophils Relative: 0 % (ref 0–5)
Lymphocytes Relative: 3 % — ABNORMAL LOW (ref 12–46)
Monocytes Absolute: 1.9 10*3/uL — ABNORMAL HIGH (ref 0.1–1.0)
Monocytes Relative: 15 % — ABNORMAL HIGH (ref 3–12)
Neutrophils Relative %: 79 % — ABNORMAL HIGH (ref 43–77)
Neutrophils Relative %: 85 % — ABNORMAL HIGH (ref 43–77)

## 2011-06-17 LAB — COMPREHENSIVE METABOLIC PANEL
Albumin: 2.1 g/dL — ABNORMAL LOW (ref 3.5–5.2)
BUN: 33 mg/dL — ABNORMAL HIGH (ref 6–23)
CO2: 35 mEq/L — ABNORMAL HIGH (ref 19–32)
Chloride: 97 mEq/L (ref 96–112)
Creatinine, Ser: 1.46 mg/dL — ABNORMAL HIGH (ref 0.50–1.35)
GFR calc Af Amer: 61 mL/min — ABNORMAL LOW (ref >90)
GFR calc non Af Amer: 53 mL/min — ABNORMAL LOW (ref >90)
Glucose, Bld: 117 mg/dL — ABNORMAL HIGH (ref 70–99)
Total Bilirubin: 2 mg/dL — ABNORMAL HIGH (ref 0.3–1.2)

## 2011-06-17 LAB — CBC
MCH: 29.8 pg (ref 26.0–34.0)
MCHC: 28 g/dL — ABNORMAL LOW (ref 30.0–36.0)
Platelets: 69 10*3/uL — ABNORMAL LOW (ref 150–400)
RBC: 2.65 MIL/uL — ABNORMAL LOW (ref 4.22–5.81)
RDW: 22.2 % — ABNORMAL HIGH (ref 11.5–15.5)
WBC: 17 10*3/uL — ABNORMAL HIGH (ref 4.0–10.5)

## 2011-06-17 LAB — GLUCOSE, CAPILLARY
Glucose-Capillary: 126 mg/dL — ABNORMAL HIGH (ref 70–99)
Glucose-Capillary: 98 mg/dL (ref 70–99)
Glucose-Capillary: 101 mg/dL — ABNORMAL HIGH (ref 70–99)

## 2011-06-17 LAB — RENAL FUNCTION PANEL
CO2: 28 mEq/L (ref 19–32)
Calcium: 8.5 mg/dL (ref 8.4–10.5)
Creatinine, Ser: 1.5 mg/dL — ABNORMAL HIGH (ref 0.50–1.35)
GFR calc Af Amer: 59 mL/min — ABNORMAL LOW (ref >90)
GFR calc non Af Amer: 51 mL/min — ABNORMAL LOW (ref >90)
Glucose, Bld: 119 mg/dL — ABNORMAL HIGH (ref 70–99)
Phosphorus: 2.7 mg/dL (ref 2.3–4.6)
Sodium: 134 mEq/L — ABNORMAL LOW (ref 135–145)

## 2011-06-17 LAB — PREPARE PLATELET PHERESIS

## 2011-06-17 MED ORDER — HYDROCORTISONE SOD SUCCINATE 100 MG IJ SOLR
25.0000 mg | Freq: Every day | INTRAMUSCULAR | Status: DC
  Administered 2011-06-18 – 2011-06-19 (×2): 25 mg via INTRAVENOUS
  Filled 2011-06-17 (×2): qty 0.5

## 2011-06-17 MED ORDER — SODIUM PHOSPHATE 3 MMOLE/ML IV SOLN
20.0000 mmol | Freq: Once | INTRAVENOUS | Status: AC
  Administered 2011-06-17: 20 mmol via INTRAVENOUS
  Filled 2011-06-17: qty 6.67

## 2011-06-17 MED ORDER — FENTANYL CITRATE 0.05 MG/ML IJ SOLN
25.0000 ug | INTRAMUSCULAR | Status: DC | PRN
  Administered 2011-06-17 – 2011-06-18 (×4): 25 ug via INTRAVENOUS
  Administered 2011-06-18: 200 ug via INTRAVENOUS
  Administered 2011-06-20 – 2011-06-26 (×16): 25 ug via INTRAVENOUS
  Filled 2011-06-17: qty 4
  Filled 2011-06-17 (×20): qty 2

## 2011-06-17 NOTE — Progress Notes (Signed)
[facility] Progress Note  Subjective:  Had trach done yest.  Breathing easily.  Wide awake, alert, writing messages Objective. D/c'd citrate anticoagulation due to metabolic alkalosis Filed Vitals:   06/17/11 0400 06/17/11 0500 06/17/11 0600 06/17/11 0700  BP: 121/86 136/90 129/91 134/89  Pulse: 110 97 112 110  Temp: 97.7 F (36.5 C)     TempSrc: Oral     Resp: 23 24 20 22   Height:      Weight:  124.4 kg (274 lb 4 oz)    SpO2: 100% 100% 100% 100%  BP 134/89  Pulse 110  Temp(Src) 97.7 F (36.5 C) (Oral)  Resp 22  Ht 5\' 11"  (1.803 m)  Wt 124.4 kg (274 lb 4 oz)  BMI 38.25 kg/m2  SpO2 100%  Physical Exam I/O last 3 completed shifts: In: 4462.5 [I.V.:2659.5; Blood:288; NG/GT:1230; IV Piggyback:285] Out: 69629 [Urine:77; BMWUX:3244; Stool:2470]   Wt Readings from Last 3 Encounters:  06/17/11 124.4 kg (274 lb 4 oz) (down from 127.3 past 24 hours; max weight 147.2 on 06/10/11   General:appears quite comfortable without pain or SOB Heart:Tachy 110 no rub Lungs:Coarse but clear Abdomen:Protuberant No focal tenderness Extremities:3+edema Dialysis Access: temp cath right IJ Flexiseal - liquid stool Tube feeds via Panda Additional Objective Labs: Basic Metabolic Panel:  Lab 06/17/11 0102 06/16/11 1540 06/16/11 1525 06/16/11 0500  NA 140 141 141 --  K 3.7 3.6 3.6 --  CL 97 -- 92* 92*  CO2 35* -- 40* 39*  GLUCOSE 117* -- 128* 133*  BUN 33* -- 27* 29*  CREATININE 1.46* -- 1.41* 1.45*  CALCIUM 9.2 -- 9.7 9.8  ALB -- -- -- --  PHOS 2.0* -- 2.9 2.4   Liver Function Tests:  Lab 06/17/11 0425 06/16/11 1525 06/16/11 0500  AST 126* -- --  ALT 52 -- --  ALKPHOS 93 -- --  BILITOT 2.0* -- --  PROT 7.8 -- --  ALBUMIN 2.1* 2.1* 1.8*   No results found for this basename: LIPASE:3,AMYLASE:3 in the last 168 hours No results found for this basename: AMMONIA:3 in the last 168 hours INR: No components found with this basename: INR3 CBC:  Lab 06/17/11 0425  06/16/11 1540 06/16/11 0915 06/16/11 0500 06/15/11 0514 06/14/11 0306 06/13/11 1627  WBC 17.0* -- -- 17.7* 18.5* -- --  NEUTROABS 13.4* -- -- -- 16.2* -- --  HGB 7.9* 10.5* 10.9* -- -- -- --  HCT 27.5* 31.0* 32.0* -- -- -- --  MCV 103.8* -- -- 102.2* 98.2 94.9 94.7  PLT 69* -- -- 70* 69* -- --   Blood Culture    Component Value Date/Time   SDES BLOOD LEFT HAND 06/08/2011 0420   SPECREQUEST BOTTLES DRAWN AEROBIC ONLY 10CC 06/08/2011 0420   CULT NO GROWTH 5 DAYS 06/08/2011 0420   REPTSTATUS 06/14/2011 FINAL 06/08/2011 0420    Cardiac Enzymes: No results found for this basename: CKTOTAL:5,CKMB:5,CKMBINDEX:5,TROPONINI:5 in the last 168 hours CBG:  Lab 06/17/11 0357 06/17/11 0005 06/16/11 2003 06/16/11 1543 06/16/11 1240  GLUCAP 126* 115* 111* 144* 128*   Iron Studies: No results found for this basename: IRON,TIBC,TRANSFERRIN,FERRITIN in the last 72 hours Studies/Results: Chest Portable 1 View To Assess Tube Placement And Rule-out Pneumothorax  06/16/2011  *RADIOLOGY REPORT*  Clinical Data: ET tube placement  PORTABLE CHEST - 1 VIEW  Comparison: 06/15/2011  Findings: Tracheostomy in satisfactory position, terminating 4 cm above the carina.  Stable left and right IJ venous catheters.  Enteric tube coursing below the diaphragm.  Multifocal airspace  opacities, suggestive of moderate to severe pulmonary edema, mildly increased on the left.  No pneumothorax.  Stable cardiomegaly.  IMPRESSION: Tracheostomy in satisfactory position, terminating 4 cm above the carina.  Cardiomegaly with suspected moderate to severe pulmonary edema, mildly increased on the left.  Otherwise stable support apparatus.  Original Report Authenticated By: Charline Bills, M.D.   Medications:    . sodium chloride 10 mL/hr at 06/16/11 0459  . amiodarone (NEXTERONE PREMIX) 360 mg/200 mL dextrose 0.5 mg/min (06/17/11 0604)  . dialysis replacement fluid (prismasate) 200 mL/hr at 06/17/11 0526  . dialysis replacement fluid  (prismasate) 300 mL/hr at 06/16/11 1700  . dialysate (PRISMASATE) 2,000 mL/hr at 06/17/11 0607  . DISCONTD: calcium gluconate infusion for CRRT 20 g (06/16/11 0933)  . DISCONTD: fentaNYL infusion INTRAVENOUS 100 mcg/hr (06/15/11 1900)  . DISCONTD: dialysis replacement fluid (prismasate) 200 mL/hr at 06/16/11 0215  . DISCONTD: sodium citrate 2 %/dextrose 2.5% solution 3000 mL 450 mL/hr at 06/16/11 1607      . antiseptic oral rinse  15 mL Mouth Rinse QID  . chlorhexidine  15 mL Mouth Rinse BID  . collagenase   Topical Daily  . darbepoetin (ARANESP) injection - DIALYSIS  100 mcg Intravenous Q Wed-HD  . desmopressin (DDAVP) IV  20 mcg Intravenous Once  . feeding supplement (OXEPA)  1,000 mL Per Tube Q24H  . feeding supplement  60 mL Per Tube QID  . fentaNYL  200 mcg Intravenous Once  . hydrocortisone sod succinate (SOLU-CORTEF) injection  25 mg Intravenous Q12H  . IMMUNE GLOBULIN 10% (HUMAN) IV - For Fluid Restriction Only  0.5 g/kg Intravenous QODAY  . insulin aspart  0-4 Units Subcutaneous Q4H  . midazolam  5 mg Intravenous Once  . mulitivitamin  5 mL Per Tube Daily  . pantoprazole sodium  40 mg Per Tube BID  . potassium chloride  40 mEq Oral Daily  . propofol  5-70 mcg/kg/min Intravenous Once  . Ribavirin  400 mg Per Tube TID  . sirolimus  0.75 mg Oral Daily  . sodium phosphate  Dextrose 5% IVPB  20 mmol Intravenous Once  . vecuronium  10 mg Intravenous Once  . DISCONTD: etomidate  40 mg Intravenous Once   Assessment/Recs: Pt is a 56 y.o. yo male s/p renal transplant who was admitted on 06/03/2011 with PNA pulm HTN, volume overload now with acute on chronic renal failure requiring institution of CRRT   1. PNA/cardiomyopathy/hypotension- per CCM and cards. Requiring vent support; has trach; hemodynamically more stable (off pressors) still with quite a bit of volume excess;   2. 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  Citrate protocol stopped d/t alkalosis and CO2 better. Doing OK without anticoagulation. 4K dialysate and daily supplement. Labs stable except requiring daily phos replacement. BP much better but still quite a lot of volume on board by exam. Would like to keep CRRT going to facilitate further fluid removal since tolerating neg 150/hr very well  Regarding renal transplant, continuing his home dose of sirolimus and stress dose steroids which were decreased 5/15. Baseline function was not great with creatinine of 2. Minimal UOP, may not recover. Still on steroids and sirolimus.  Will need tunnelled catheter later this week if possible when CCM feels able  3. Anemia- hgb stable today. aranesp   4. Secondary hyperparathyroidism- no treatment right now, rocaltrol stopped - not essential at this time and calcium up   5. HTN/volume very overloaded appearing  still. Tolerating volume removal and does seem to have responded. Continue negative 150/hour  6. Hypophosphatemia- has been requiring daily repletion (30/20)  Ordered additional 20 today   7. PNA parainfluenza- on ribavirin (day 6/7) and IVIG     06/17/2011,7:46 AM  LOS: 14 days

## 2011-06-17 NOTE — Progress Notes (Signed)
ASSName: BLAYZE HAEN MRN: [id number] DOB: 04/14/1955    LOS: 14  PCCM  NOTE  History of Present Illness: [name] s/p renal Tx, immune suppressed, adm 5/08 to [facility] service with 3 days of cough, shortness of breath and fever. Progressive resp failure resulting in intubation 5/8.   Lines / Drains: R IJ HD cath 5/12 >> 5/13 (inadvertent removal) Aline 5/8>>>out R IJ HD cath 5/13 >>  Left IJ 5/8 >>  ETT 5/8 >>5/17>>>retubed 6 hrs>>>5/20 Trach 5/20 (df)>>>  Cultures: BAL bronch 5/8>>>ng    AFB >> neg   Viral panel >> POS parainfluenza virus   Pneumocystis >> neg   Fungus >> neg   HSV >> neg BC 5/7>>>neg  Blood X 2 5/12 >> neg  Antibiotics: (per ID) 5/7 levoflox>>>5/10 5/7 bactrim >>>5/10 5/7 micafungin >> 5/14 5/7 vanc >> 5/10 5/10 cefepime >> 5/15 5/15 Ribaviran/IVIG >>   Tests / Events: 5/8 hypoxic resp failure, intubated, near arrest 5/8 FOB - diffuse airway edema, diffuse bloody mucoid secretions 5/11 worsening shock , on sepsis protocol + dobut for low co-ox 5/12 Echocardiogram:  LVEF 15-20%. Diffuse HK 5/15 Family mtg: continue full aggressive support but NCB if arrests. To reassess in 4-5 days. If no progress, consider withdrawal at that time 5/15 Trial of Ribaviran/IVIG 5/16 Markedly improved - reduced pressors, improved cognition, gas exchange improved 5/17 continued improvement. Tolerates PS 5 cm H2O with Vt > 800cc 5/17- extubated, re intubated 5/20- trach  Subj:   Ddavp, plat for trach, neg balance  Vital Signs: Temp:  [97.7 F (36.5 C)-100 F (37.8 C)] 99.3 F (37.4 C) (05/21 1238) Pulse Rate:  [86-118] 110  (05/21 1300) Resp:  [18-30] 25  (05/21 1300) BP: (107-143)/(73-104) 126/88 mmHg (05/21 1300) SpO2:  [94 %-100 %] 98 % (05/21 1300) FiO2 (%):  [35 %-60 %] 35 % (05/21 1300) Weight:  [124.4 kg (274 lb 4 oz)] 124.4 kg (274 lb 4 oz) (05/21 0500) I/O last 3 completed shifts: In: 4462.5 [I.V.:2659.5; Blood:288; NG/GT:1230; IV  Piggyback:285] Out: 40981 [Urine:77; XBJYN:8295; Stool:2470]  Physical Examination: General: RASS -1, awake, appears well on trach collar  Neuro:  Nonfocal HEENT:  Line clean, trach some heme Neck:  jvd flat, obese neck slight Cardiovascular:  s1 s2 RRR ST Lungs:   coarse bases, anterior clear Abdomen:  Soft, bs wnl no r/g Lower ext-has edema lowers  Ventilator settings: Vent Mode:  [-] PRVC FiO2 (%):  [35 %-60 %] 35 % Set Rate:  [20 bmp] 20 bmp Vt Set:  [600 mL] 600 mL PEEP:  [5 cmH20] 5 cmH20 Plateau Pressure:  [16 cmH20] 16 cmH20  Labs and Imaging:   CXR: no new film CBC    Component Value Date/Time   WBC 17.0* 06/17/2011 0425   RBC 2.65* 06/17/2011 0425   HGB 7.9* 06/17/2011 0425   HCT 27.5* 06/17/2011 0425   PLT 69* 06/17/2011 0425   MCV 103.8* 06/17/2011 0425   MCH 29.8 06/17/2011 0425   MCHC 28.7* 06/17/2011 0425   RDW 21.7* 06/17/2011 0425   LYMPHSABS 1.0 06/17/2011 0425   MONOABS 2.6* 06/17/2011 0425   EOSABS 0.0 06/17/2011 0425   BASOSABS 0.0 06/17/2011 0425    BMET    Component Value Date/Time   NA 140 06/17/2011 0425   K 3.7 06/17/2011 0425   CL 97 06/17/2011 0425   CO2 35* 06/17/2011 0425   GLUCOSE 117* 06/17/2011 0425   BUN 33* 06/17/2011 0425   CREATININE 1.46* 06/17/2011 0425  CALCIUM 9.2 06/17/2011 0425   GFRNONAA 53* 06/17/2011 0425   GFRAA 61* 06/17/2011 0425     Assessment and Plan: 1. Pulmonary  - Acute respiratory failure  PNA, immunosuppressed Re intubated 5/17 5/20 trach Plan - -continue neg balance -has residual ARDS likely -trach collar attempt -needs nocturnal ventialtion -no sig bleeding now on Trach site, keep sutures in , observe, cbc in am   2. CVS Cardiomyopathy of unclear etiology - suspect viral Shock - cardiogenic and/or septic - off pressors as of AM 5/17.   PSVT/A fib/flutter/sinus tachy - Amiodarone started per cards.  - Low dose PRM metoprolol for tachycardia (keep HR < 115/min) - steroids for kidney reduce ( d/w renal)  3.  Renal Renal TX, ATN, acidosis & mild hyperkalemia - Cont CVVH per Renal - neg balance goal again  4. ID PNA Immunocompromised host Empiric HCAP   - abx Per ID - off antibacterials as of 5/15.  Per ID Ribaviran, stop date? S/p IVIG, stop date planned? Can have renal insuff contribution in setting Tx failure  4. Heme Leukocytosis, thrombocytopenia - WBC improving. TCP stable 5/17 UGI bleeding from OGT resolved- cont off all heparins - SCDs for DVT prophy Trach with limited bleeding, m ay need plat Tx if worsen  5. Endo - Cont CBGs, SSI  6. MIld agitation - Cont daily WUA -upright position Dc continuous fent after trach if able Pt/ot, cvvhd when off  7. GI/ Nutrition - not tolerating gastric feedings. - \TF held for trach -ppi -lft in am on ribovirun Hold off peg, hope will avoid Slp, pmv  8. H/o Hep C - monitor LFTs periodically  Best practices / Disposition: -->ICU status under PCCM -->full Rx but NCB if suffers cardiac arrest - would re-intubate if fails attempt at extubation -->SCDs -->Protonix for GI Px -->TFs Updated son   30 mins CCM time  Mcarthur Rossetti. Tyson Alias, MD, FACP Pgr: [phone] [place] [facility]

## 2011-06-17 NOTE — Progress Notes (Signed)
INFECTIOUS DISEASE PROGRESS NOTE  ID: Chad [name] is a 56 y.o. male with  Principal Problem:  *Septic shock Active Problems:  HCAP (healthcare-associated pneumonia)  Thrombocytopenia  Acute respiratory failure  ARDS (adult respiratory distress syndrome)  Renal transplant disorder  Acute systolic heart failure  Acute renal failure  Cardiomyopathy  Subjective: Awake and alert. Comfortable on TC.   Abtx:  Anti-infectives     Start     Dose/Rate Route Frequency Ordered Stop   06/13/11 1200   vancomycin (VANCOCIN) 50 mg/mL oral solution 500 mg  Status:  Discontinued        500 mg Per Tube 4 times per day 06/13/11 1045 06/13/11 1600   06/13/11 1200   metroNIDAZOLE (FLAGYL) IVPB 500 mg  Status:  Discontinued        500 mg 100 mL/hr over 60 Minutes Intravenous Every 8 hours 06/13/11 1045 06/13/11 1600   06/12/11 1000   Ribavirin SOLN 400 mg     Comments: Special order item.       400 mg Per Tube 3 times daily 06/11/11 1346     06/11/11 1600   ribavirin (REBETOL) capsule 400 mg  Status:  Discontinued        400 mg Oral 3 times daily 06/11/11 1239 06/11/11 1343   06/08/11 2200   ceFEPIme (MAXIPIME) 2 g in dextrose 5 % 50 mL IVPB     Comments: Use Cefepime 1 g IV q12h for CrCl< 60 mL/min      2 g 100 mL/hr over 30 Minutes Intravenous Every 12 hours 06/08/11 1536 06/11/11 2320   06/08/11 0800   piperacillin-tazobactam (ZOSYN) IVPB 3.375 g  Status:  Discontinued        3.375 g 12.5 mL/hr over 240 Minutes Intravenous Every 8 hours 06/08/11 0149 06/08/11 1155   06/08/11 0600   micafungin (MYCAMINE) 100 mg in sodium chloride 0.9 % 100 mL IVPB  Status:  Discontinued        100 mg 100 mL/hr over 1 Hours Intravenous Daily 06/08/11 0308 06/10/11 1703   06/08/11 0200  piperacillin-tazobactam (ZOSYN) IVPB 3.375 g       3.375 g 100 mL/hr over 30 Minutes Intravenous  Once 06/08/11 0149 06/08/11 0254   06/06/11 0600   vancomycin (VANCOCIN) 1,750 mg in sodium chloride 0.9 % 500 mL IVPB   Status:  Discontinued        1,750 mg 250 mL/hr over 120 Minutes Intravenous Every 24 hours 06/05/11 0906 06/08/11 0403   06/05/11 2200   levofloxacin (LEVAQUIN) IVPB 750 mg  Status:  Discontinued     Comments: Use Levaquin 750 mg IV q48h for CrCl < 74mL/min      750 mg 100 mL/hr over 90 Minutes Intravenous Every 48 hours 06/04/11 0344 06/05/11 0908   06/05/11 1000   levofloxacin (LEVAQUIN) IVPB 750 mg  Status:  Discontinued     Comments: Use Levaquin 750 mg IV q48h for CrCl < 15mL/min      750 mg 100 mL/hr over 90 Minutes Intravenous Every 24 hours 06/05/11 0908 06/06/11 0908   06/04/11 1200   sulfamethoxazole-trimethoprim (BACTRIM) 664 mg in dextrose 5 % 500 mL IVPB  Status:  Discontinued        20 mg/kg/day  132.7 kg 361 mL/hr over 90 Minutes Intravenous 4 times per day 06/04/11 1039 06/06/11 0908   06/04/11 1100   micafungin (MYCAMINE) 100 mg in sodium chloride 0.9 % 100 mL IVPB  Status:  Discontinued  100 mg 100 mL/hr over 1 Hours Intravenous Daily 06/04/11 1030 06/06/11 0908   06/04/11 0600   ceFEPIme (MAXIPIME) 1 g in dextrose 5 % 50 mL IVPB  Status:  Discontinued     Comments: Use Cefepime 1 g IV q12h for CrCl< 60 mL/min      1 g 100 mL/hr over 30 Minutes Intravenous Every 12 hours 06/04/11 0344 06/08/11 1536   06/04/11 0600   vancomycin (VANCOCIN) 1,500 mg in sodium chloride 0.9 % 500 mL IVPB  Status:  Discontinued        1,500 mg 250 mL/hr over 120 Minutes Intravenous Every 24 hours 06/04/11 0425 06/05/11 0906   06/04/11 0430   levofloxacin (LEVAQUIN) IVPB 750 mg        750 mg 100 mL/hr over 90 Minutes Intravenous  Once 06/04/11 0354 06/04/11 0632   06/03/11 2330   vancomycin (VANCOCIN) IVPB 1000 mg/200 mL premix        1,000 mg 200 mL/hr over 60 Minutes Intravenous  Once 06/03/11 2326 06/04/11 0051   06/03/11 2330  piperacillin-tazobactam (ZOSYN) IVPB 3.375 g       3.375 g 12.5 mL/hr over 240 Minutes Intravenous  Once 06/03/11 2326 06/04/11 0521            Medications:  Scheduled:   . antiseptic oral rinse  15 mL Mouth Rinse QID  . chlorhexidine  15 mL Mouth Rinse BID  . collagenase   Topical Daily  . darbepoetin (ARANESP) injection - DIALYSIS  100 mcg Intravenous Q Wed-HD  . feeding supplement (OXEPA)  1,000 mL Per Tube Q24H  . feeding supplement  60 mL Per Tube QID  . fentaNYL  200 mcg Intravenous Once  . hydrocortisone sod succinate (SOLU-CORTEF) injection  25 mg Intravenous Q12H  . IMMUNE GLOBULIN 10% (HUMAN) IV - For Fluid Restriction Only  0.5 g/kg Intravenous QODAY  . insulin aspart  0-4 Units Subcutaneous Q4H  . midazolam  5 mg Intravenous Once  . mulitivitamin  5 mL Per Tube Daily  . pantoprazole sodium  40 mg Per Tube BID  . potassium chloride  40 mEq Oral Daily  . propofol  5-70 mcg/kg/min Intravenous Once  . Ribavirin  400 mg Per Tube TID  . sirolimus  0.75 mg Oral Daily  . sodium phosphate  Dextrose 5% IVPB  20 mmol Intravenous Once  . sodium phosphate  Dextrose 5% IVPB  20 mmol Intravenous Once  . vecuronium  10 mg Intravenous Once  . DISCONTD: etomidate  40 mg Intravenous Once    Objective: Vital signs in last 24 hours: Temp:  [97.7 F (36.5 C)-100 F (37.8 C)] 100 F (37.8 C) (05/21 0826) Pulse Rate:  [76-122] 109  (05/21 0900) Resp:  [15-30] 27  (05/21 0900) BP: (79-143)/(50-104) 125/85 mmHg (05/21 0900) SpO2:  [86 %-100 %] 95 % (05/21 0900) FiO2 (%):  [35 %-60 %] 35 % (05/21 0900) Weight:  [124.4 kg (274 lb 4 oz)] 124.4 kg (274 lb 4 oz) (05/21 0500)   General appearance: alert and no distress Eyes: negative findings: EOMI Resp: rhonchi bilaterally and mild Cardio: tachycardia GI: normal findings: bowel sounds normal and soft, non-tender  Lab Results  Basename 06/17/11 0425 06/16/11 1540 06/16/11 1525 06/16/11 0500  WBC 17.0* -- -- 17.7*  HGB 7.9* 10.5* -- --  HCT 27.5* 31.0* -- --  NA 140 141 -- --  K 3.7 3.6 -- --  CL 97 -- 92* --  CO2 35* -- 40* --  BUN 33* -- 27* --  CREATININE 1.46*  -- 1.41* --  GLU -- -- -- --   Liver Panel  Basename 06/17/11 0425 06/16/11 1525  PROT 7.8 --  ALBUMIN 2.1* 2.1*  AST 126* --  ALT 52 --  ALKPHOS 93 --  BILITOT 2.0* --  BILIDIR -- --  IBILI -- --   Sedimentation Rate No results found for this basename: ESRSEDRATE in the last 72 hours C-Reactive Protein No results found for this basename: CRP:2 in the last 72 hours  Microbiology: Recent Results (from the past 240 hour(s))  CULTURE, BLOOD (ROUTINE X 2)     Status: Normal   Collection Time   06/08/11  4:15 AM      Component Value Range Status Comment   Specimen Description BLOOD LEFT HAND   Final    Special Requests BOTTLES DRAWN AEROBIC ONLY 10CC   Final    Culture  Setup Time [id number]   Final    Culture NO GROWTH 5 DAYS   Final    Report Status 06/14/2011 FINAL   Final   CULTURE, BLOOD (ROUTINE X 2)     Status: Normal   Collection Time   06/08/11  4:20 AM      Component Value Range Status Comment   Specimen Description BLOOD LEFT HAND   Final    Special Requests BOTTLES DRAWN AEROBIC ONLY 10CC   Final    Culture  Setup Time [id number]   Final    Culture NO GROWTH 5 DAYS   Final    Report Status 06/14/2011 FINAL   Final   RESPIRATORY VIRUS PANEL (18 COMPONENTS)     Status: Abnormal   Collection Time   06/11/11  8:33 PM      Component Value Range Status Comment   Source - RVPAN BRONCHIAL ALVEOLAR LAVAGE   Final    Respiratory Syncytial Virus A NOT DETECTED   Final    Respiratory Syncytial Virus B NOT DETECTED   Final    Influenza A NOT DETECTED   Final    Influenza B NOT DETECTED   Final    Parainfluenza 1 NOT DETECTED   Final    Parainfluenza 2 NOT DETECTED   Final    Parainfluenza 3 DETECTED (*)  Final    Parainfluenza 4 NOT DETECTED   Final    Metapneumovirus NOT DETECTED   Final    Coxsackie and Echovirus NOT DETECTED   Final    Rhinovirus NOT DETECTED   Final    Adenovirus B NOT DETECTED   Final    Adenovirus E NOT DETECTED   Final    CoronavirusNL63  NOT DETECTED   Final    CoronavirusHKU1 NOT DETECTED   Final    Coronavirus229E NOT DETECTED   Final    CoronavirusOC43 NOT DETECTED   Final   CLOSTRIDIUM DIFFICILE BY PCR     Status: Normal   Collection Time   06/13/11  1:14 PM      Component Value Range Status Comment   C difficile by pcr NEGATIVE  NEGATIVE  Final     Studies/Results: Dg Chest Port 1 View  06/17/2011  *RADIOLOGY REPORT*  Clinical Data: Shortness of breath status post tracheostomy. Assess edema.  PORTABLE CHEST - 1 VIEW  Comparison: 06/16/2011 and 06/15/2011.  Findings: 0423 hours.  The tracheostomy, feeding tube and bilateral central lines are unchanged.  There is persistent cardiomegaly. Bilateral air space opacities have not significantly progressed compared with the most recent  examination.  There is no pleural effusion or pneumothorax.  IMPRESSION: No significant change in bilateral air space opacities consistent with edema.  Persistent cardiomegaly.  Original Report Authenticated By: Gerrianne Scale, M.D.   Chest Portable 1 View To Assess Tube Placement And Rule-out Pneumothorax  06/16/2011  *RADIOLOGY REPORT*  Clinical Data: ET tube placement  PORTABLE CHEST - 1 VIEW  Comparison: 06/15/2011  Findings: Tracheostomy in satisfactory position, terminating 4 cm above the carina.  Stable left and right IJ venous catheters.  Enteric tube coursing below the diaphragm.  Multifocal airspace opacities, suggestive of moderate to severe pulmonary edema, mildly increased on the left.  No pneumothorax.  Stable cardiomegaly.  IMPRESSION: Tracheostomy in satisfactory position, terminating 4 cm above the carina.  Cardiomegaly with suspected moderate to severe pulmonary edema, mildly increased on the left.  Otherwise stable support apparatus.  Original Report Authenticated By: Charline Bills, M.D.     Assessment/Plan: Renal Txp (sirolimus, hydrocortisone)  On CVVHD  Pneumonia (parainfluenza), viral CM?  For trach today  Day 6/7  ribavirin, IVIg  Off anbx  Will cont to watch for viral PNA and CM.  Appears to be doing well. Will continue his IVIg and Ribavirin for next 24 hours.  WBC remains elevated- steroids? Much more comfortable on trach.   Johny Sax Infectious Diseases [phone] 06/17/2011, 9:22 AM   LOS: 14 days

## 2011-06-17 NOTE — Progress Notes (Signed)
Placed pt back in rest mode due to high RR and pt complaining of being tired

## 2011-06-17 NOTE — Progress Notes (Signed)
Nutrition Follow-up  Patient was extubated and re-intubated 5/17.  S/P tracheostomy 5/20.  Patient tolerating TF today per RN.  Diet Order:  Oxepa at 30 ml/h with Prostat 60 ml QID providing 1880 kcals, 165 grams protein, 568 ml free water daily.  Receiving liquid multivitamin daily.  Meds: Scheduled Meds:   . antiseptic oral rinse  15 mL Mouth Rinse QID  . chlorhexidine  15 mL Mouth Rinse BID  . collagenase   Topical Daily  . darbepoetin (ARANESP) injection - DIALYSIS  100 mcg Intravenous Q Wed-HD  . feeding supplement (OXEPA)  1,000 mL Per Tube Q24H  . feeding supplement  60 mL Per Tube QID  . hydrocortisone sod succinate (SOLU-CORTEF) injection  25 mg Intravenous Daily  . IMMUNE GLOBULIN 10% (HUMAN) IV - For Fluid Restriction Only  0.5 g/kg Intravenous QODAY  . insulin aspart  0-4 Units Subcutaneous Q4H  . mulitivitamin  5 mL Per Tube Daily  . pantoprazole sodium  40 mg Per Tube BID  . potassium chloride  40 mEq Oral Daily  . Ribavirin  400 mg Per Tube TID  . sirolimus  0.75 mg Oral Daily  . sodium phosphate  Dextrose 5% IVPB  20 mmol Intravenous Once  . DISCONTD: etomidate  40 mg Intravenous Once  . DISCONTD: hydrocortisone sod succinate (SOLU-CORTEF) injection  25 mg Intravenous Q12H   Continuous Infusions:   . sodium chloride 10 mL/hr at 06/16/11 0459  . amiodarone (NEXTERONE PREMIX) 360 mg/200 mL dextrose 0.5 mg/min (06/17/11 0604)  . dialysis replacement fluid (prismasate) 200 mL/hr at 06/17/11 0526  . dialysis replacement fluid (prismasate) 500 mL/hr at 06/17/11 1453  . dialysate (PRISMASATE) 2,000 mL/hr at 06/17/11 1505  . DISCONTD: calcium gluconate infusion for CRRT 20 g (06/16/11 0933)  . DISCONTD: dialysis replacement fluid (prismasate) 200 mL/hr at 06/16/11 0215  . DISCONTD: sodium citrate 2 %/dextrose 2.5% solution 3000 mL 450 mL/hr at 06/16/11 1607   PRN Meds:.albuterol, fentaNYL, fentaNYL, metoprolol, DISCONTD: haloperidol lactate  Labs:  CMP     Component  Value Date/Time   NA 140 06/17/2011 0425   K 3.7 06/17/2011 0425   CL 97 06/17/2011 0425   CO2 35* 06/17/2011 0425   GLUCOSE 117* 06/17/2011 0425   BUN 33* 06/17/2011 0425   CREATININE 1.46* 06/17/2011 0425   CALCIUM 9.2 06/17/2011 0425   PROT 7.8 06/17/2011 0425   ALBUMIN 2.1* 06/17/2011 0425   AST 126* 06/17/2011 0425   ALT 52 06/17/2011 0425   ALKPHOS 93 06/17/2011 0425   BILITOT 2.0* 06/17/2011 0425   GFRNONAA 53* 06/17/2011 0425   GFRAA 61* 06/17/2011 0425   CBG (last 3)   Basename 06/17/11 1237 06/17/11 0815 06/17/11 0357  GLUCAP 101* 98 126*     Intake/Output Summary (Last 24 hours) at 06/17/11 1602 Last data filed at 06/17/11 1500  Gross per 24 hour  Intake 1919.1 ml  Output   6020 ml  Net -4100.9 ml    Weight Status:  124.4 kg (down with negative fluid balance); BMI=38.3--class 2 obesity  Re-estimated needs:  2500 kcals, 150-165 grams protein daily  Nutrition Dx:  Inadequate oral intake, ongoing.  Goal:  Enteral nutrition to provide 60-70% of estimated calorie needs (22-25 kcals/kg ideal body weight) and 100% of estimated protein needs, based on ASPEN guidelines for permissive underfeeding in critically ill obese individuals.  Intervention:  Continue Oxepa at 30 ml/h with Prostat 60 ml QID to provide 1880 kcals (24 kcals/kg ideal body weight), 165 grams protein, 568  ml free water daily  Monitor:  TF tolerance, labs, weight trend.   Hettie Holstein Pager #:  [phone]

## 2011-06-17 NOTE — Progress Notes (Addendum)
Clinical Social Worker reviewed chart and staffed case with RNCM.  Pt would benefit from PT/OT consults to assist with dc planning; please order once medically appropriate.  CSW and RNCM question if [facility] might be beneficial for pt's rehab post dc from hospital.    Angelia Mould, MSW, LCSWA [phone]

## 2011-06-17 NOTE — Progress Notes (Signed)
Patient ID: Chad [name], male   DOB: 20-Jul-1955, 56 y.o.   MRN: [id number] Subjective:  Remains intubated but awake and alert  Objective:  Vital Signs in the last 24 hours: Temp:  [97.7 F (36.5 C)-100 F (37.8 C)] 100 F (37.8 C) (05/21 0826) Pulse Rate:  [76-122] 94  (05/21 0819) Resp:  [15-30] 20  (05/21 0819) BP: (79-143)/(50-104) 125/86 mmHg (05/21 0819) SpO2:  [86 %-100 %] 96 % (05/21 0819) FiO2 (%):  [35 %-60 %] 35 % (05/21 0819) Weight:  [274 lb 4 oz (124.4 kg)] 274 lb 4 oz (124.4 kg) (05/21 0500)  Intake/Output from previous day: 05/20 0701 - 05/21 0700 In: 2882.1 [I.V.:1379.1; Blood:288; NG/GT:930; IV Piggyback:285] Out: 7148 [Urine:52; Stool:1550] Intake/Output from this shift: Total I/O In: 96.7 [I.V.:36.7; NG/GT:60] Out: 227 [Urine:18; Other:209]  Physical Exam: intubated HEENT: Unremarkable except small amount of red blood around trachea tube Neck:  Cannot assess JVD, no thyromegally Lungs:  Clear with scattered rales HEART:  Regular rate rhythm, no murmurs, no rubs, no clicks Abd:  soft, positive bowel sounds, no organomegally, no rebound, no guarding Ext:  2 plus pulses, 2+ edema, no cyanosis, no clubbing Skin:  No rashes no nodules Neuro:  CN II through XII intact, motor grossly intact  Lab Results:  Basename 06/17/11 0425 06/16/11 1540 06/16/11 0500  WBC 17.0* -- 17.7*  HGB 7.9* 10.5* --  PLT 69* -- 70*    Basename 06/17/11 0425 06/16/11 1540 06/16/11 1525  NA 140 141 --  K 3.7 3.6 --  CL 97 -- 92*  CO2 35* -- 40*  GLUCOSE 117* -- 128*  BUN 33* -- 27*  CREATININE 1.46* -- 1.41*   No results found for this basename: TROPONINI:2,CK,MB:2 in the last 72 hours Hepatic Function Panel  Basename 06/17/11 0425  PROT 7.8  ALBUMIN 2.1*  AST 126*  ALT 52  ALKPHOS 93  BILITOT 2.0*  BILIDIR --  IBILI --   No results found for this basename: CHOL in the last 72 hours No results found for this basename: PROTIME in the last 72  hours  Imaging: Dg Chest Port 1 View  06/17/2011  *RADIOLOGY REPORT*  Clinical Data: Shortness of breath status post tracheostomy. Assess edema.  PORTABLE CHEST - 1 VIEW  Comparison: 06/16/2011 and 06/15/2011.  Findings: 0423 hours.  The tracheostomy, feeding tube and bilateral central lines are unchanged.  There is persistent cardiomegaly. Bilateral air space opacities have not significantly progressed compared with the most recent examination.  There is no pleural effusion or pneumothorax.  IMPRESSION: No significant change in bilateral air space opacities consistent with edema.  Persistent cardiomegaly.  Original Report Authenticated By: Gerrianne Scale, M.D.   Chest Portable 1 View To Assess Tube Placement And Rule-out Pneumothorax  06/16/2011  *RADIOLOGY REPORT*  Clinical Data: ET tube placement  PORTABLE CHEST - 1 VIEW  Comparison: 06/15/2011  Findings: Tracheostomy in satisfactory position, terminating 4 cm above the carina.  Stable left and right IJ venous catheters.  Enteric tube coursing below the diaphragm.  Multifocal airspace opacities, suggestive of moderate to severe pulmonary edema, mildly increased on the left.  No pneumothorax.  Stable cardiomegaly.  IMPRESSION: Tracheostomy in satisfactory position, terminating 4 cm above the carina.  Cardiomegaly with suspected moderate to severe pulmonary edema, mildly increased on the left.  Otherwise stable support apparatus.  Original Report Authenticated By: Charline Bills, M.D.    Cardiac Studies: Tele - atrial flutter with 2:1 AV conduction, atrial rate is slower  Assessment/Plan:  1. Atrial flutter 2. Cardiomyopathy, questionable tachy mediated 3. Acute on chronic systolic heart failure 4. VDRF, s/p trach Rec: continue IV amiodarone for rate control. Once trach site is not bleeding, consider starting anti-coagulation with Xarelto. If he does not spontaneously return to NSR, could consider TEE guided DCCV or catheter ablation.  Lewayne Bunting, M.D.  LOS: 14 days    Lewayne Bunting 06/17/2011, 8:56 AM

## 2011-06-18 ENCOUNTER — Inpatient Hospital Stay (HOSPITAL_COMMUNITY): Payer: BC Managed Care – PPO

## 2011-06-18 LAB — RENAL FUNCTION PANEL
Albumin: 2 g/dL — ABNORMAL LOW (ref 3.5–5.2)
CO2: 26 mEq/L (ref 19–32)
Calcium: 8.1 mg/dL — ABNORMAL LOW (ref 8.4–10.5)
Chloride: 98 mEq/L (ref 96–112)
Creatinine, Ser: 1.65 mg/dL — ABNORMAL HIGH (ref 0.50–1.35)
GFR calc Af Amer: 56 mL/min — ABNORMAL LOW (ref >90)
GFR calc non Af Amer: 48 mL/min — ABNORMAL LOW (ref >90)
GFR calc non Af Amer: 46 mL/min — ABNORMAL LOW (ref >90)
Glucose, Bld: 112 mg/dL — ABNORMAL HIGH (ref 70–99)
Phosphorus: 2.1 mg/dL — ABNORMAL LOW (ref 2.3–4.6)
Phosphorus: 3.7 mg/dL (ref 2.3–4.6)
Potassium: 4.1 mEq/L (ref 3.5–5.1)
Sodium: 134 mEq/L — ABNORMAL LOW (ref 135–145)

## 2011-06-18 LAB — GLUCOSE, CAPILLARY
Glucose-Capillary: 100 mg/dL — ABNORMAL HIGH (ref 70–99)
Glucose-Capillary: 103 mg/dL — ABNORMAL HIGH (ref 70–99)
Glucose-Capillary: 113 mg/dL — ABNORMAL HIGH (ref 70–99)
Glucose-Capillary: 114 mg/dL — ABNORMAL HIGH (ref 70–99)

## 2011-06-18 LAB — DIFFERENTIAL
Basophils Absolute: 0.2 10*3/uL — ABNORMAL HIGH (ref 0.0–0.1)
Eosinophils Absolute: 0 10*3/uL (ref 0.0–0.7)
Eosinophils Relative: 0 % (ref 0–5)
Monocytes Relative: 10 % (ref 3–12)
Neutro Abs: 18.9 10*3/uL — ABNORMAL HIGH (ref 1.7–7.7)
Neutrophils Relative %: 80 % — ABNORMAL HIGH (ref 43–77)

## 2011-06-18 LAB — POCT I-STAT 3, ART BLOOD GAS (G3+)
Bicarbonate: 28.4 mEq/L — ABNORMAL HIGH (ref 20.0–24.0)
pCO2 arterial: 35.4 mmHg (ref 35.0–45.0)
pO2, Arterial: 59 mmHg — ABNORMAL LOW (ref 80.0–100.0)

## 2011-06-18 LAB — CBC
MCV: 105 fL — ABNORMAL HIGH (ref 78.0–100.0)
Platelets: 99 10*3/uL — ABNORMAL LOW (ref 150–400)
RBC: 2.58 MIL/uL — ABNORMAL LOW (ref 4.22–5.81)
RDW: 23.1 % — ABNORMAL HIGH (ref 11.5–15.5)
WBC: 23.6 10*3/uL — ABNORMAL HIGH (ref 4.0–10.5)

## 2011-06-18 LAB — MAGNESIUM: Magnesium: 2.2 mg/dL (ref 1.5–2.5)

## 2011-06-18 MED ORDER — SODIUM PHOSPHATE 3 MMOLE/ML IV SOLN
30.0000 mmol | Freq: Once | INTRAVENOUS | Status: AC
  Administered 2011-06-18: 30 mmol via INTRAVENOUS
  Filled 2011-06-18: qty 10

## 2011-06-18 MED ORDER — SODIUM CHLORIDE 0.9 % IV SOLN
125.0000 mg | Freq: Every day | INTRAVENOUS | Status: AC
  Administered 2011-06-18 – 2011-06-27 (×10): 125 mg via INTRAVENOUS
  Filled 2011-06-18 (×21): qty 10

## 2011-06-18 NOTE — Progress Notes (Signed)
eLink Physician-Brief Progress Note Patient Name: Chad [name] DOB: 12/04/1955 MRN: [id number]  Date of Service  06/18/2011   HPI/Events of Note  hypophosphatemia   eICU Interventions  Phos replaced   Intervention Category Intermediate Interventions: Electrolyte abnormality - evaluation and management  Maven Rosander 06/18/2011, 6:16 AM

## 2011-06-18 NOTE — Progress Notes (Signed)
SLP Cancellation Note  Continued daily f/u to determine readiness for PMSV assessment.  Pt still working toward TC tolerance; currently being cleaned up by RN secondary to bleeding around trach site; copious secretions.  Discussed with RN - concur that SLP will return tomorrow to assess readiness.  Rebbie Lauricella L. Samson Frederic, [place] CCC/SLP Pager [phone]   Blenda Mounts Laurice 06/18/2011, 2:11 PM

## 2011-06-18 NOTE — Op Note (Signed)
NAME:  Chad [name], MORRICAL NO.:  [id number]  MEDICAL RECORD NO.:  [id number]  LOCATION:                                 FACILITY:  PHYSICIAN:  [name], MD DATE OF BIRTH:  1955-03-11  DATE OF PROCEDURE:  06/16/2011 DATE OF DISCHARGE:                              OPERATIVE REPORT   PROCEDURE:  Percutaneous tracheostomy.  PREOPERATIVE DIAGNOSIS:  Status post failed renal transplant, with adult respiratory distress syndrome pneumonia from parainfluenza virus, and recurrent respiratory failure requiring reintubation x2.  POSTOPERATIVE DIAGNOSIS:  Status post tracheostomy secondary to adult respiratory distress syndrome, recurrent pneumonia, respiratory failure.  Consent was obtained from the patient and his son, fully aware of risks and benefits of the procedure including infection, pneumothorax, bleeding, and death.  The patient placed in supine position.  BRONCHOSCOPIST:  Lonia Farber, MD, who placed the bronchoscope through the endotracheal tube and backed out approximately 18 cm.  After chlorhexidine preparation over the insertion site, I injected 8 mL of lidocaine plus epinephrine.  I made a 1.2 cm vertical incision and then dissection was made through the subcutaneous fat and identify the strap muscles and dissected  between them.  I then placed an 18 gauge needle over the white catheter sheath into the airway which is noted by the bronchoscopist without any posterior wall injury.  White catheter sheath remained, the needle was removed.  I then placed a wire through the white catheter sheath and white catheter sheath was removed.  I then placed a 14-French punch dilator over the wire in an hour.  Then placed a progressive rhino dilator over a glider to approximately 37-French in and out.  The dilator was removed.  The glider and wire remained and then I placed a size 8 tracheostomy over glider and a 28-French dilator into the airway  successfully.  Everything was removed except for the tracheostomy.  The bronchoscopist placed a bronchoscope through the new tracheostomy and noted the carina approximately 4 cm below lining posterior wall injury or lacerations.  The airway was sutured in place with 0 monofilament sutures and a portable chest x-ray postop revealed a well placed tracheostomy without any air, pneumothorax, or complications.  Blood loss for the procedure was approximately 7 mL. Please note the patient received platelet transfusion, and DDAVP prior to the procedure to limit bleeding.     [name], MD     DJF/MEDQ  D:  06/17/2011  T:  06/17/2011  Job:  [id number]

## 2011-06-18 NOTE — Progress Notes (Signed)
ASSName: DAVELLE ANSELMI MRN: [id number] DOB: November 11, 1955    LOS: 15  PCCM  NOTE  History of Present Illness: [name] s/p renal Tx, immune suppressed, adm 5/08 to [facility] service with 3 days of cough, shortness of breath and fever. Progressive resp failure resulting in intubation 5/8.   Lines / Drains: R IJ HD cath 5/12 >> 5/13 (inadvertent removal) Aline 5/8>>>out R IJ HD cath 5/13 >>  Left IJ 5/8 >>  ETT 5/8 >>5/17>>>retubed 6 hrs>>>5/20 Trach 5/20 (df)>>>  Cultures: BAL bronch 5/8>>>ng    AFB >> neg   Viral panel >> POS parainfluenza virus   Pneumocystis >> neg   Fungus >> neg   HSV >> neg BC 5/7>>>neg  Blood X 2 5/12 >> neg  Antibiotics: (per ID) 5/7 levoflox>>>5/10 5/7 bactrim >>>5/10 5/7 micafungin >> 5/14 5/7 vanc >> 5/10 5/10 cefepime >> 5/15 5/15 Ribaviran/IVIG >>   Tests / Events: 5/8 hypoxic resp failure, intubated, near arrest 5/8 FOB - diffuse airway edema, diffuse bloody mucoid secretions 5/11 worsening shock , on sepsis protocol + dobut for low co-ox 5/12 Echocardiogram:  LVEF 15-20%. Diffuse HK 5/15 Family mtg: continue full aggressive support but NCB if arrests. To reassess in 4-5 days. If no progress, consider withdrawal at that time 5/15 Trial of Ribaviran/IVIG 5/16 Markedly improved - reduced pressors, improved cognition, gas exchange improved 5/17 continued improvement. Tolerates PS 5 cm H2O with Vt > 800cc 5/17- extubated, re intubated 5/20- trach  Subj:   Clot around trach stable, neg   Vital Signs: Temp:  [98.2 F (36.8 C)-98.8 F (37.1 C)] 98.6 F (37 C) (05/22 1155) Pulse Rate:  [79-110] 102  (05/22 1200) Resp:  [14-27] 24  (05/22 1200) BP: (97-129)/(61-92) 120/85 mmHg (05/22 1200) SpO2:  [92 %-100 %] 100 % (05/22 1200) FiO2 (%):  [35 %-40 %] 40 % (05/22 1200) Weight:  [120.2 kg (264 lb 15.9 oz)] 120.2 kg (264 lb 15.9 oz) (05/22 0500) I/O last 3 completed shifts: In: 2959.2 [I.V.:1141.2; NG/GT:1460; IV Piggyback:358] Out: 8443  [Urine:49; WUJWJ:1914; Stool:1350]  Physical Examination: General: RASS -1, awake, appears well on trach collar  Neuro:  Nonfocal, does a strong fist pound, spirits r up HEENT:  Line clean, trach with clot extensive arounf Neck:  jvd flat, line with blood on dressing Cardiovascular:  s1 s2 RRR ST Lungs:   coarse bases Abdomen:  Soft, bs wnl no r/g Lower ext-has edema lowers  Ventilator settings: Vent Mode:  [-] PSV;CPAP FiO2 (%):  [35 %-40 %] 40 % Set Rate:  [20 bmp] 20 bmp Vt Set:  [600 mL] 600 mL PEEP:  [5 cmH20] 5 cmH20 Pressure Support:  [14 cmH20] 14 cmH20  Labs and Imaging:   CXR: no new film CBC    Component Value Date/Time   WBC 15.6* 06/17/2011 1349   RBC 2.72* 06/17/2011 1349   HGB 8.1* 06/17/2011 1349   HCT 28.9* 06/17/2011 1349   PLT 81* 06/17/2011 1349   MCV 106.3* 06/17/2011 1349   MCH 29.8 06/17/2011 1349   MCHC 28.0* 06/17/2011 1349   RDW 22.2* 06/17/2011 1349   LYMPHSABS 0.5* 06/17/2011 1349   MONOABS 1.9* 06/17/2011 1349   EOSABS 0.0 06/17/2011 1349   BASOSABS 0.0 06/17/2011 1349    BMET    Component Value Date/Time   NA 134* 06/18/2011 0522   K 4.1 06/18/2011 0522   CL 98 06/18/2011 0522   CO2 27 06/18/2011 0522   GLUCOSE 103* 06/18/2011 0522   BUN  38* 06/18/2011 0522   CREATININE 1.58* 06/18/2011 0522   CALCIUM 8.3* 06/18/2011 0522   GFRNONAA 48* 06/18/2011 0522   GFRAA 56* 06/18/2011 0522     Assessment and Plan: 1. Pulmonary  - Acute respiratory failure  PNA, immunosuppressed Re intubated 5/17 5/20 trach Plan - -continue neg balance -has residual ARDS likely -trach collar goal 4 hours - I will deroof clot today and place surgiseal likely needed around and may need plat Tx  2. CVS Cardiomyopathy of unclear etiology - suspect viral Shock - cardiogenic and/or septic - off pressors as of AM 5/17.   PSVT/A fib/flutter/sinus tachy - Amiodarone started per cards.  - Low dose PRM metoprolol for tachycardia (keep HR < 115/min) - steroids for kidney  reduce ( d/w renal), sounds like we can reduce, done, remain  3. Renal Renal TX, ATN, acidosis & mild hyperkalemia - Cont CVVH per Renal - neg balance goal again  4. ID PNA Immunocompromised host Empiric HCAP   - abx Per ID - off antibacterials as of 5/15.  Per ID Ribaviran, stop date? S/p IVIG,allow to dc today  4. Heme Leukocytosis, thrombocytopenia - WBC improving. TCP stable 5/17 UGI bleeding from OGT resolved- cont off all heparins - scd -plat some rise -will clean up and de rrof trach site  5. Endo - Cont CBGs, SSI dc not needed  6. MIld agitation -upright position Pt/ot, cvvhd when off  7. GI/ Nutrition - not tolerating gastric feedings. - await PMV, SLP ppi  8. H/o Hep C - monitor LFTs periodically  Best practices / Disposition: -->ICU status under PCCM -->full Rx but NCB if suffers cardiac arrest - would re-intubate if fails attempt at extubation -->SCDs -->Protonix for GI Px -->TFs    Mcarthur Rossetti. Tyson Alias, MD, FACP Pgr: [phone] [facility]

## 2011-06-18 NOTE — Progress Notes (Signed)
INFECTIOUS DISEASE PROGRESS NOTE  ID: Chad [name] is a 56 y.o. male with  Principal Problem:  *Septic shock Active Problems:  HCAP (healthcare-associated pneumonia)  Thrombocytopenia  Acute respiratory failure  ARDS (adult respiratory distress syndrome)  Renal transplant disorder  Acute systolic heart failure  Acute renal failure  Cardiomyopathy  Subjective: On vent  Abtx:  Anti-infectives     Start     Dose/Rate Route Frequency Ordered Stop   06/13/11 1200   vancomycin (VANCOCIN) 50 mg/mL oral solution 500 mg  Status:  Discontinued        500 mg Per Tube 4 times per day 06/13/11 1045 06/13/11 1600   06/13/11 1200   metroNIDAZOLE (FLAGYL) IVPB 500 mg  Status:  Discontinued        500 mg 100 mL/hr over 60 Minutes Intravenous Every 8 hours 06/13/11 1045 06/13/11 1600   06/12/11 1000   Ribavirin SOLN 400 mg     Comments: Special order item.       400 mg Per Tube 3 times daily 06/11/11 1346     06/11/11 1600   ribavirin (REBETOL) capsule 400 mg  Status:  Discontinued        400 mg Oral 3 times daily 06/11/11 1239 06/11/11 1343   06/08/11 2200   ceFEPIme (MAXIPIME) 2 g in dextrose 5 % 50 mL IVPB     Comments: Use Cefepime 1 g IV q12h for CrCl< 60 mL/min      2 g 100 mL/hr over 30 Minutes Intravenous Every 12 hours 06/08/11 1536 06/11/11 2320   06/08/11 0800   piperacillin-tazobactam (ZOSYN) IVPB 3.375 g  Status:  Discontinued        3.375 g 12.5 mL/hr over 240 Minutes Intravenous Every 8 hours 06/08/11 0149 06/08/11 1155   06/08/11 0600   micafungin (MYCAMINE) 100 mg in sodium chloride 0.9 % 100 mL IVPB  Status:  Discontinued        100 mg 100 mL/hr over 1 Hours Intravenous Daily 06/08/11 0308 06/10/11 1703   06/08/11 0200  piperacillin-tazobactam (ZOSYN) IVPB 3.375 g       3.375 g 100 mL/hr over 30 Minutes Intravenous  Once 06/08/11 0149 06/08/11 0254   06/06/11 0600   vancomycin (VANCOCIN) 1,750 mg in sodium chloride 0.9 % 500 mL IVPB  Status:  Discontinued       1,750 mg 250 mL/hr over 120 Minutes Intravenous Every 24 hours 06/05/11 0906 06/08/11 0403   06/05/11 2200   levofloxacin (LEVAQUIN) IVPB 750 mg  Status:  Discontinued     Comments: Use Levaquin 750 mg IV q48h for CrCl < 28mL/min      750 mg 100 mL/hr over 90 Minutes Intravenous Every 48 hours 06/04/11 0344 06/05/11 0908   06/05/11 1000   levofloxacin (LEVAQUIN) IVPB 750 mg  Status:  Discontinued     Comments: Use Levaquin 750 mg IV q48h for CrCl < 56mL/min      750 mg 100 mL/hr over 90 Minutes Intravenous Every 24 hours 06/05/11 0908 06/06/11 0908   06/04/11 1200   sulfamethoxazole-trimethoprim (BACTRIM) 664 mg in dextrose 5 % 500 mL IVPB  Status:  Discontinued        20 mg/kg/day  132.7 kg 361 mL/hr over 90 Minutes Intravenous 4 times per day 06/04/11 1039 06/06/11 0908   06/04/11 1100   micafungin (MYCAMINE) 100 mg in sodium chloride 0.9 % 100 mL IVPB  Status:  Discontinued        100 mg 100  mL/hr over 1 Hours Intravenous Daily 06/04/11 1030 06/06/11 0908   06/04/11 0600   ceFEPIme (MAXIPIME) 1 g in dextrose 5 % 50 mL IVPB  Status:  Discontinued     Comments: Use Cefepime 1 g IV q12h for CrCl< 60 mL/min      1 g 100 mL/hr over 30 Minutes Intravenous Every 12 hours 06/04/11 0344 06/08/11 1536   06/04/11 0600   vancomycin (VANCOCIN) 1,500 mg in sodium chloride 0.9 % 500 mL IVPB  Status:  Discontinued        1,500 mg 250 mL/hr over 120 Minutes Intravenous Every 24 hours 06/04/11 0425 06/05/11 0906   06/04/11 0430   levofloxacin (LEVAQUIN) IVPB 750 mg        750 mg 100 mL/hr over 90 Minutes Intravenous  Once 06/04/11 0354 06/04/11 0632   06/03/11 2330   vancomycin (VANCOCIN) IVPB 1000 mg/200 mL premix        1,000 mg 200 mL/hr over 60 Minutes Intravenous  Once 06/03/11 2326 06/04/11 0051   06/03/11 2330  piperacillin-tazobactam (ZOSYN) IVPB 3.375 g       3.375 g 12.5 mL/hr over 240 Minutes Intravenous  Once 06/03/11 2326 06/04/11 0521          Medications:    Scheduled:   . antiseptic oral rinse  15 mL Mouth Rinse QID  . chlorhexidine  15 mL Mouth Rinse BID  . collagenase   Topical Daily  . darbepoetin (ARANESP) injection - DIALYSIS  100 mcg Intravenous Q Wed-HD  . feeding supplement (OXEPA)  1,000 mL Per Tube Q24H  . feeding supplement  60 mL Per Tube QID  . ferric gluconate (FERRLECIT/NULECIT) IV  125 mg Intravenous Daily  . hydrocortisone sod succinate (SOLU-CORTEF) injection  25 mg Intravenous Daily  . IMMUNE GLOBULIN 10% (HUMAN) IV - For Fluid Restriction Only  0.5 g/kg Intravenous QODAY  . mulitivitamin  5 mL Per Tube Daily  . pantoprazole sodium  40 mg Per Tube BID  . potassium chloride  40 mEq Oral Daily  . Ribavirin  400 mg Per Tube TID  . sirolimus  0.75 mg Oral Daily  . sodium phosphate  Dextrose 5% IVPB  20 mmol Intravenous Once  . sodium phosphate  Dextrose 5% IVPB  30 mmol Intravenous Once  . DISCONTD: insulin aspart  0-4 Units Subcutaneous Q4H    Objective: Vital signs in last 24 hours: Temp:  [98.2 F (36.8 C)-98.8 F (37.1 C)] 98.6 F (37 C) (05/22 1155) Pulse Rate:  [79-110] 105  (05/22 1532) Resp:  [14-27] 23  (05/22 1532) BP: (96-129)/(61-92) 113/86 mmHg (05/22 1500) SpO2:  [92 %-100 %] 100 % (05/22 1532) FiO2 (%):  [40 %] 40 % (05/22 1532) Weight:  [120.2 kg (264 lb 15.9 oz)] 120.2 kg (264 lb 15.9 oz) (05/22 0500)   General appearance: no distress Neck: trach in place Resp: rhonchi bilaterally Cardio: regular rate and rhythm GI: normal findings: bowel sounds normal and soft, non-tender  Lab Results  Basename 06/18/11 0522 06/17/11 1349 06/17/11 0425  WBC -- 15.6* 17.0*  HGB -- 8.1* 7.9*  HCT -- 28.9* 27.5*  NA 134* 134* --  K 4.1 4.2 --  CL 98 96 --  CO2 27 28 --  BUN 38* 32* --  CREATININE 1.58* 1.50* --  GLU -- -- --   Liver Panel  Basename 06/18/11 0522 06/17/11 1349 06/17/11 0425  PROT -- -- 7.8  ALBUMIN 2.0* 2.0* --  AST -- -- 126*  ALT -- --  52  ALKPHOS -- -- 93  BILITOT -- --  2.0*  BILIDIR -- -- --  IBILI -- -- --   Sedimentation Rate No results found for this basename: ESRSEDRATE in the last 72 hours C-Reactive Protein No results found for this basename: CRP:2 in the last 72 hours  Microbiology: Recent Results (from the past 240 hour(s))  RESPIRATORY VIRUS PANEL (18 COMPONENTS)     Status: Abnormal   Collection Time   06/11/11  8:33 PM      Component Value Range Status Comment   Source - RVPAN BRONCHIAL ALVEOLAR LAVAGE   Final    Respiratory Syncytial Virus A NOT DETECTED   Final    Respiratory Syncytial Virus B NOT DETECTED   Final    Influenza A NOT DETECTED   Final    Influenza B NOT DETECTED   Final    Parainfluenza 1 NOT DETECTED   Final    Parainfluenza 2 NOT DETECTED   Final    Parainfluenza 3 DETECTED (*)  Final    Parainfluenza 4 NOT DETECTED   Final    Metapneumovirus NOT DETECTED   Final    Coxsackie and Echovirus NOT DETECTED   Final    Rhinovirus NOT DETECTED   Final    Adenovirus B NOT DETECTED   Final    Adenovirus E NOT DETECTED   Final    CoronavirusNL63 NOT DETECTED   Final    CoronavirusHKU1 NOT DETECTED   Final    Coronavirus229E NOT DETECTED   Final    CoronavirusOC43 NOT DETECTED   Final   CLOSTRIDIUM DIFFICILE BY PCR     Status: Normal   Collection Time   06/13/11  1:14 PM      Component Value Range Status Comment   C difficile by pcr NEGATIVE  NEGATIVE  Final     Studies/Results: Dg Chest Port 1 View  06/18/2011  *RADIOLOGY REPORT*  Clinical Data: Assess for pulmonary edema.  PORTABLE CHEST - 1 VIEW  Comparison: Chest x-ray 06/17/2011.  Findings: A tracheostomy tube is in place with tip 2.9 cm above the carina. There is a left-sided internal jugular central venous catheter with tip terminating in the distal superior vena cava. There is a right-sided internal jugular central venous catheter with tip terminating in the distal superior vena cava. A feeding tube is seen extending into the abdomen, however, the tip of the  feeding tube extends below the lower margin of the image. Lung volumes are low. There is cephalization of the pulmonary vasculature, indistinctness of the interstitial markings, and patchy airspace disease throughout the lungs bilaterally suggestive of moderate pulmonary edema.  Bibasilar opacities are favored to represent areas of subsegmental atelectasis.  No definite pleural effusions.  Moderate enlargement of the cardiopericardial silhouette is similar to priors. The patient is rotated to the right on today's exam, resulting in distortion of the mediastinal contours and reduced diagnostic sensitivity and specificity for mediastinal pathology.  Atherosclerotic calcifications within the arch of the aorta.  IMPRESSION: 1.  Support apparatus, as above. 2.  Slight improvement in what is now moderate pulmonary edema. 3.  Moderate enlargement of the cardiopericardial silhouette may reflect underlying cardiomegaly and/or the presence of a pericardial effusion. 4.  Atherosclerosis.  Original Report Authenticated By: Florencia Reasons, M.D.   Dg Chest Port 1 View  06/17/2011  *RADIOLOGY REPORT*  Clinical Data: Shortness of breath status post tracheostomy. Assess edema.  PORTABLE CHEST - 1 VIEW  Comparison: 06/16/2011 and 06/15/2011.  Findings: 0423 hours.  The tracheostomy, feeding tube and bilateral central lines are unchanged.  There is persistent cardiomegaly. Bilateral air space opacities have not significantly progressed compared with the most recent examination.  There is no pleural effusion or pneumothorax.  IMPRESSION: No significant change in bilateral air space opacities consistent with edema.  Persistent cardiomegaly.  Original Report Authenticated By: Gerrianne Scale, M.D.     Assessment/Plan: Renal Txp (sirolimus, hydrocortisone)  On CRRT  Pneumonia (parainfluenza), viral CM?  For trach today  Day 7/7 ribavirin, IVIg  Off anbx, will stop Ig and ribavirin. Will cont to watch  viral PNA and CM.   WBC remains improving- steroids?  Much more comfortable on trach.    Johny Sax Infectious Diseases [phone] 06/18/2011, 3:45 PM   LOS: 15 days

## 2011-06-18 NOTE — Progress Notes (Signed)
Patient ID: Chad [name], male   DOB: March 18, 1955, 56 y.o.   MRN: [id number] Subjective:  Remains on ventilatory support  Objective:  Vital Signs in the last 24 hours: Temp:  [98.2 F (36.8 C)-98.8 F (37.1 C)] 98.3 F (36.8 C) (05/22 1600) Pulse Rate:  [79-110] 99  (05/22 1700) Resp:  [14-27] 21  (05/22 1700) BP: (94-129)/(61-92) 94/61 mmHg (05/22 1700) SpO2:  [92 %-100 %] 97 % (05/22 1700) FiO2 (%):  [40 %] 40 % (05/22 1700) Weight:  [264 lb 15.9 oz (120.2 kg)] 264 lb 15.9 oz (120.2 kg) (05/22 0500)  Intake/Output from previous day: 05/21 0701 - 05/22 0700 In: 2008.8 [I.V.:700.8; NG/GT:950; IV Piggyback:358] Out: 5010 [Urine:24; Stool:500] Intake/Output from this shift: Total I/O In: 1335 [I.V.:367; NG/GT:600; IV Piggyback:368] Out: 2736 [Urine:37; JJHER:7408; Stool:450]  Physical Exam: Chronically ill-appearing with tracheostomy tube in place HEENT: Unremarkable except tracheostomy tube in place Neck:  No JVD, no thyromegally Lungs:  rales in the bases bilaterally. HEART:  Regular tachy rhythm, no murmurs, no rubs, no clicks Abd:  Flat, positive bowel sounds, no organomegally, no rebound, no guarding Ext:  2 plus pulses, no edema, no cyanosis, no clubbing Skin:  No rashes no nodules Neuro:  CN II through XII intact, motor grossly intact  Lab Results:  Basename 06/18/11 1545 06/17/11 1349  WBC 23.6* 15.6*  HGB 7.9* 8.1*  PLT 99* 81*    Basename 06/18/11 1545 06/18/11 0522  NA 135 134*  K 4.0 4.1  CL 99 98  CO2 26 27  GLUCOSE 112* 103*  BUN 39* 38*  CREATININE 1.65* 1.58*   No results found for this basename: TROPONINI:2,CK,MB:2 in the last 72 hours Hepatic Function Panel  Basename 06/18/11 1545 06/17/11 0425  PROT -- 7.8  ALBUMIN 2.0* --  AST -- 126*  ALT -- 52  ALKPHOS -- 93  BILITOT -- 2.0*  BILIDIR -- --  IBILI -- --   No results found for this basename: CHOL in the last 72 hours No results found for this basename: PROTIME in the last 72  hours  Imaging: Dg Chest Port 1 View  06/18/2011  *RADIOLOGY REPORT*  Clinical Data: Assess for pulmonary edema.  PORTABLE CHEST - 1 VIEW  Comparison: Chest x-ray 06/17/2011.  Findings: A tracheostomy tube is in place with tip 2.9 cm above the carina. There is a left-sided internal jugular central venous catheter with tip terminating in the distal superior vena cava. There is a right-sided internal jugular central venous catheter with tip terminating in the distal superior vena cava. A feeding tube is seen extending into the abdomen, however, the tip of the feeding tube extends below the lower margin of the image. Lung volumes are low. There is cephalization of the pulmonary vasculature, indistinctness of the interstitial markings, and patchy airspace disease throughout the lungs bilaterally suggestive of moderate pulmonary edema.  Bibasilar opacities are favored to represent areas of subsegmental atelectasis.  No definite pleural effusions.  Moderate enlargement of the cardiopericardial silhouette is similar to priors. The patient is rotated to the right on today's exam, resulting in distortion of the mediastinal contours and reduced diagnostic sensitivity and specificity for mediastinal pathology.  Atherosclerotic calcifications within the arch of the aorta.  IMPRESSION: 1.  Support apparatus, as above. 2.  Slight improvement in what is now moderate pulmonary edema. 3.  Moderate enlargement of the cardiopericardial silhouette may reflect underlying cardiomegaly and/or the presence of a pericardial effusion. 4.  Atherosclerosis.  Original Report Authenticated By:  Florencia Reasons, M.D.   Dg Chest Port 1 View  06/17/2011  *RADIOLOGY REPORT*  Clinical Data: Shortness of breath status post tracheostomy. Assess edema.  PORTABLE CHEST - 1 VIEW  Comparison: 06/16/2011 and 06/15/2011.  Findings: 0423 hours.  The tracheostomy, feeding tube and bilateral central lines are unchanged.  There is persistent  cardiomegaly. Bilateral air space opacities have not significantly progressed compared with the most recent examination.  There is no pleural effusion or pneumothorax.  IMPRESSION: No significant change in bilateral air space opacities consistent with edema.  Persistent cardiomegaly.  Original Report Authenticated By: Gerrianne Scale, M.D.    Cardiac Studies: Telemetry  - Atrial flutter with 2:1 AV conduction. Ventricular rate 110 beats a minute Assessment/Plan:  1. Atrial flutter - his ventricular rate is improved with intravenous amiodarone. He may ultimately return to sinus rhythm. At any rate he'll be much easier to manage now that his ventricular rate is reduced. 2. acute on chronic systolic heart failure - blood pressures were stable and weight continues to be diuresed off with CRRT. He has lost over 50 pounds. 3. Anticoagulation - at this point, it looks as if he will require anti-coagulation. Would consider initiating either intravenous heparin or Coumadin.  LOS: 15 days    Lewayne Bunting 06/18/2011, 5:11 PM

## 2011-06-18 NOTE — Progress Notes (Signed)
[facility] Progress Note  Subjective:  Remains wide awake. Alert and appropriate.  Large clot around trach that will be unroofed today with platelet support. Remains on CRRT - minus 150/hour with nice decrease in weights, stable BP E-link gave IV phos overnight Objective Filed Vitals:   06/18/11 0600 06/18/11 0700 06/18/11 0747 06/18/11 0800  BP: 101/83 106/76  117/82  Pulse: 106 107  105  Temp:   98.4 F (36.9 C)   TempSrc:   Oral   Resp: 23 20  21   Height:      Weight:      SpO2: 100% 99%  100%  BP 117/82  Pulse 105  Temp(Src) 98.4 F (36.9 C) (Oral)  Resp 21  Ht 5\' 11"  (1.803 m)  Wt 120.2 kg (264 lb 15.9 oz)  BMI 36.96 kg/m2  SpO2 100%  Physical Exam General: Awake and alert Trach with large gelatinous clot around it Heart:Tachy - sinus shythm Lungs:Still with coarse BS/ crackles bilaterally Abdomen:soft without tenderness; scars no chabge Extremities:2-3+ edema - better - some wrinkles now in left foot; mid tib regions bilat erythematous; still substantial pitting Dialysis Access: right IJ temp cath [facility]'s Flexiseal with large liquid stool Weights: 06/18/11 0500 120.2 kg  06/17/11 0500 124.4 kg  06/16/11 0600 127.3 kg  06/15/11 0400 130.3 kg   06/14/11 0400 ! 136.4 kg   06/13/11 0500 ! 139.3 kg   06/12/11 0500 ! 142.2 kg  Additional Objective  Labs: Basic Metabolic Panel:  Lab 06/18/11 2130 06/17/11 1349 06/17/11 0425  NA 134* 134* 140  K 4.1 4.2 3.7  CL 98 96 97  CO2 27 28 35*  GLUCOSE 103* 119* 117*  BUN 38* 32* 33*  CREATININE 1.58* 1.50* 1.46*  CALCIUM 8.3* 8.5 9.2  ALB -- -- --  PHOS 2.1* 2.7 2.0*   Liver Function Tests:  Lab 06/18/11 0522 06/17/11 1349 06/17/11 0425  AST -- -- 126*  ALT -- -- 52  ALKPHOS -- -- 93  BILITOT -- -- 2.0*  PROT -- -- 7.8  ALBUMIN 2.0* 2.0* 2.1*   Lab 06/17/11 1349 06/17/11 0425 06/16/11 1540 06/16/11 0500 06/15/11 0514 06/14/11 0306  WBC 15.6* 17.0* -- 17.7* -- --  NEUTROABS 13.2*  13.4* -- -- 16.2* --  HGB 8.1* 7.9* 10.5* -- -- --  HCT 28.9* 27.5* 31.0* -- -- --  MCV 106.3* 103.8* -- 102.2* 98.2 94.9  PLT 81* 69* -- 70* -- --   Lab Results  Component Value Date   IRON 11* 06/04/2011   TIBC 195* 06/04/2011   Blood Culture    Component Value Date/Time   SDES BLOOD LEFT HAND 06/08/2011 0420   SPECREQUEST BOTTLES DRAWN AEROBIC ONLY 10CC 06/08/2011 0420   CULT NO GROWTH 5 DAYS 06/08/2011 0420   REPTSTATUS 06/14/2011 FINAL 06/08/2011 0420   Studies/Results: Dg Chest Port 1 View  06/18/2011  *RADIOLOGY REPORT*  Clinical Data: Assess for pulmonary edema.  PORTABLE CHEST - 1 VIEW  Comparison: Chest x-ray 06/17/2011.  Findings: A tracheostomy tube is in place with tip 2.9 cm above the carina. There is a left-sided internal jugular central venous catheter with tip terminating in the distal superior vena cava. There is a right-sided internal jugular central venous catheter with tip terminating in the distal superior vena cava. A feeding tube is seen extending into the abdomen, however, the tip of the feeding tube extends below the lower margin of the image. Lung volumes are low. There is cephalization of the  pulmonary vasculature, indistinctness of the interstitial markings, and patchy airspace disease throughout the lungs bilaterally suggestive of moderate pulmonary edema.  Bibasilar opacities are favored to represent areas of subsegmental atelectasis.  No definite pleural effusions.  Moderate enlargement of the cardiopericardial silhouette is similar to priors. The patient is rotated to the right on today's exam, resulting in distortion of the mediastinal contours and reduced diagnostic sensitivity and specificity for mediastinal pathology.  Atherosclerotic calcifications within the arch of the aorta.  IMPRESSION: 1.  Support apparatus, as above. 2.  Slight improvement in what is now moderate pulmonary edema. 3.  Moderate enlargement of the cardiopericardial silhouette may reflect  underlying cardiomegaly and/or the presence of a pericardial effusion. 4.  Atherosclerosis.  Original Report Authenticated By: Florencia Reasons, M.D.  Marland Kitchenlastir Medications:  . sodium chloride 10 mL/hr at 06/16/11 0459  . amiodarone (NEXTERONE PREMIX) 360 mg/200 mL dextrose 0.5 mg/min (06/18/11 0634)  . dialysis replacement fluid (prismasate) 200 mL/hr at 06/17/11 2211  . dialysis replacement fluid (prismasate) 500 mL/hr at 06/18/11 0032  . dialysate (PRISMASATE) 2,000 mL/hr at 06/18/11 0434   . antiseptic oral rinse  15 mL Mouth Rinse QID  . chlorhexidine  15 mL Mouth Rinse BID  . collagenase   Topical Daily  . darbepoetin (ARANESP) injection - DIALYSIS  100 mcg Intravenous Q Wed-HD  . feeding supplement (OXEPA)  1,000 mL Per Tube Q24H  . feeding supplement  60 mL Per Tube QID  . hydrocortisone sod succinate (SOLU-CORTEF) injection  25 mg Intravenous Daily  . IMMUNE GLOBULIN 10% (HUMAN) IV - For Fluid Restriction Only  0.5 g/kg Intravenous QODAY  . insulin aspart  0-4 Units Subcutaneous Q4H  . mulitivitamin  5 mL Per Tube Daily  . pantoprazole sodium  40 mg Per Tube BID  . potassium chloride  40 mEq Oral Daily  . Ribavirin  400 mg Per Tube TID  . sirolimus  0.75 mg Oral Daily  . sodium phosphate  Dextrose 5% IVPB  20 mmol Intravenous Once  . sodium phosphate  Dextrose 5% IVPB  30 mmol Intravenous Once  . DISCONTD: hydrocortisone sod succinate (SOLU-CORTEF) injection  25 mg Intravenous Q12H   I  have reviewed scheduled and prn medications.  Assessment/Recs:  Pt is a 56 y.o. 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 PNA/parainfluenza/cardiomyopathy/hypotension- per CCM and cards. Requiring vent support; has trach; hemodynamically more stable (off pressors) still with quite a bit of volume excess but tolerating CRRT   2. 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 Citrate protocol stopped d/t alkalosis and CO2 better. Doing OK without anticoagulation. 4K dialysate and daily supplement. Labs stable except requiring daily phos replacement. BP much better but still quite a lot of volume on board by exam. Would like to keep CRRT going to facilitate further fluid removal since tolerating neg 150/hr very well and CXR improving. Weights down as follows: 06/18/11 0500 120.2 kg  06/17/11 0500 124.4 kg  06/16/11 0600 127.3 kg  06/15/11 0400 130.3 kg   06/14/11 0400 ! 136.4 kg   06/13/11 0500 ! 139.3 kg   06/12/11 0500 ! 142.2 kg   Regarding renal transplant, continuing his home dose of sirolimus and stress dose steroids which were decreased 5/15. Baseline function was not great with creatinine of 2. Minimal UOP, may  not recover. Still on steroids and sirolimus. Will need tunnelled catheter later this week if possible when CCM feels able   3. Anemia- hgb stable today. Aranesp.  Iron.   4. Secondary hyperparathyroidism- no treatment right now, rocaltrol stopped - not essential at this time and calcium up   5. HTN/volume very overloaded appearing still. Tolerating volume removal and does seem to have responded. Continue negative 150/hour   6. Hypophosphatemia- has been requiring daily repletion 20-30 mm/day ( E-link ordered this AM)   7. PNA parainfluenza- on ribavirin (day 7/7) and IVIG    06/18/2011,8:21 AM  LOS: 15 days

## 2011-06-19 ENCOUNTER — Inpatient Hospital Stay (HOSPITAL_COMMUNITY): Payer: BC Managed Care – PPO

## 2011-06-19 DIAGNOSIS — M7989 Other specified soft tissue disorders: Secondary | ICD-10-CM

## 2011-06-19 DIAGNOSIS — N186 End stage renal disease: Secondary | ICD-10-CM

## 2011-06-19 DIAGNOSIS — R609 Edema, unspecified: Secondary | ICD-10-CM

## 2011-06-19 LAB — RENAL FUNCTION PANEL
Albumin: 2.2 g/dL — ABNORMAL LOW (ref 3.5–5.2)
BUN: 39 mg/dL — ABNORMAL HIGH (ref 6–23)
CO2: 26 mEq/L (ref 19–32)
CO2: 26 mEq/L (ref 19–32)
Calcium: 8.3 mg/dL — ABNORMAL LOW (ref 8.4–10.5)
Calcium: 8.4 mg/dL (ref 8.4–10.5)
Chloride: 99 mEq/L (ref 96–112)
Creatinine, Ser: 1.58 mg/dL — ABNORMAL HIGH (ref 0.50–1.35)
Creatinine, Ser: 1.57 mg/dL — ABNORMAL HIGH (ref 0.50–1.35)
GFR calc Af Amer: 56 mL/min — ABNORMAL LOW (ref >90)
GFR calc non Af Amer: 48 mL/min — ABNORMAL LOW (ref >90)
Phosphorus: 3.1 mg/dL (ref 2.3–4.6)
Sodium: 134 mEq/L — ABNORMAL LOW (ref 135–145)

## 2011-06-19 LAB — CLOSTRIDIUM DIFFICILE BY PCR: Toxigenic C. Difficile by PCR: NEGATIVE

## 2011-06-19 MED ORDER — VANCOMYCIN HCL 1000 MG IV SOLR
1250.0000 mg | INTRAVENOUS | Status: DC
  Administered 2011-06-20 – 2011-06-22 (×3): 1250 mg via INTRAVENOUS
  Filled 2011-06-19 (×3): qty 1250

## 2011-06-19 MED ORDER — PREDNISONE 5 MG/5ML PO SOLN
10.0000 mg | Freq: Every day | ORAL | Status: DC
  Administered 2011-06-20 – 2011-06-27 (×8): 10 mg
  Filled 2011-06-19 (×9): qty 10

## 2011-06-19 MED ORDER — SODIUM PHOSPHATE 3 MMOLE/ML IV SOLN
30.0000 mmol | Freq: Once | INTRAVENOUS | Status: AC
  Administered 2011-06-19: 30 mmol via INTRAVENOUS
  Filled 2011-06-19: qty 10

## 2011-06-19 MED ORDER — VANCOMYCIN HCL 1000 MG IV SOLR
1750.0000 mg | INTRAVENOUS | Status: AC
  Administered 2011-06-19: 1750 mg via INTRAVENOUS
  Filled 2011-06-19 (×2): qty 1750

## 2011-06-19 NOTE — Progress Notes (Signed)
VASCULAR LAB PRELIMINARY  PRELIMINARY  PRELIMINARY  PRELIMINARY  Bilateral lower extremity venous Dopplers completed.    Preliminary report:  There is no obvious evidence of DVT or SVT noted in the bilateral lower extremities.  Chad [name], 06/19/2011, 12:43 PM

## 2011-06-19 NOTE — Progress Notes (Signed)
SLP Cancellation Note  Continued daily f/u to determine readiness for PMSV assessment. Pt still working toward TC tolerance; currently remains on vent. Will continue to f/u.  Ferdinand Lango MA, CCC-SLP [phone]  Ferdinand Lango Meryl 06/19/2011, 9:36 AM

## 2011-06-19 NOTE — Progress Notes (Signed)
[facility] Progress Note  Subjective:  Indicates feelsOK except for "burning sensation" left abdomen  Objective CRRT  No heparin (requiring frequent filter changes) All 4K/2.5 Ca fluids with neg 150/hour goal  Filed Vitals:   06/19/11 0700 06/19/11 0755 06/19/11 0800 06/19/11 0813  BP: 95/65  110/69 110/69  Pulse:   109 84  Temp:  99.1 F (37.3 C)    TempSrc:  Oral    Resp: 22  20 27   Height:      Weight:      SpO2: 99%  100% 100%  BP 110/69  Pulse 84  Temp(Src) 99.1 F (37.3 C) (Oral)  Resp 27  Ht 5\' 11"  (1.803 m)  Wt 114.7 kg (252 lb 13.9 oz)  BMI 35.27 kg/m2  SpO2 100% Physical Exam General:Trach, right IJ dialysis catheter Heart:Tachy around 100 Simus Lungs:Anteriorly fairly clear Abdomen:Obese; area of "burning" without rash or focal tenderness Extremities:3+edema ? Cellulitic changes both LE's Dialysis Access:  Right IJ temp line  06/19/11 0500 114.7 kg  06/18/11 0500 120.2 kg  06/17/11 0500 124.4 kg  06/16/11 0600 127.3 kg  06/15/11 0400 130.3 kg  06/14/11 0400 ! 136.4 kg   06/13/11 0500 ! 139.3 kg  06/12/11 0500 ! 142.2 kg  06/11/11 0500 ! 140.5 kg   06/10/11 0600 ! 147.2 kg   Additional Objective Labs: Basic Metabolic Panel:  Lab 06/19/11 1610 06/18/11 1545 06/18/11 0522  NA 135 135 134*  K 4.0 4.0 4.1  CL 99 99 98  CO2 26 26 27   GLUCOSE 96 112* 103*  BUN 39* 39* 38*  CREATININE 1.58* 1.65* 1.58*  CALCIUM 8.3* 8.1* 8.3*  ALB -- -- --  PHOS 2.1* 3.7 2.1*   Liver Function Tests:  Lab 06/19/11 0435 06/18/11 1545 06/18/11 0522 06/17/11 0425  AST -- -- -- 126*  ALT -- -- -- 52  ALKPHOS -- -- -- 93  BILITOT -- -- -- 2.0*  PROT -- -- -- 7.8  ALBUMIN 2.1* 2.0* 2.0* --  CBC:  Lab 06/18/11 1545 06/17/11 1349 06/17/11 0425 06/16/11 0500 06/15/11 0514  WBC 23.6* 15.6* 17.0* -- --  NEUTROABS 18.9* 13.2* 13.4* -- --  HGB 7.9* 8.1* 7.9* -- --  HCT 27.1* 28.9* 27.5* -- --  MCV 105.0* 106.3* 103.8* 102.2* 98.2  PLT 99* 81* 69*  -- --   Medications:  . sodium chloride 10 mL/hr at 06/16/11 0459  . amiodarone (NEXTERONE PREMIX) 360 mg/200 mL dextrose 0.5 mg/min (06/19/11 0807)  . dialysis replacement fluid (prismasate) 200 mL/hr at 06/17/11 2211  . dialysis replacement fluid (prismasate) 500 mL/hr at 06/19/11 0300  . dialysate (PRISMASATE) 2,000 mL/hr at 06/19/11 9604   . antiseptic oral rinse  15 mL Mouth Rinse QID  . chlorhexidine  15 mL Mouth Rinse BID  . collagenase   Topical Daily  . darbepoetin (ARANESP) injection - DIALYSIS  100 mcg Intravenous Q Wed-HD  . feeding supplement (OXEPA)  1,000 mL Per Tube Q24H  . feeding supplement  60 mL Per Tube QID  . ferric gluconate (FERRLECIT/NULECIT) IV  125 mg Intravenous Daily  . hydrocortisone sod succinate (SOLU-CORTEF) injection  25 mg Intravenous Daily  . mulitivitamin  5 mL Per Tube Daily  . pantoprazole sodium  40 mg Per Tube BID  . potassium chloride  40 mEq Oral Daily  . sirolimus  0.75 mg Oral Daily  . sodium phosphate  Dextrose 5% IVPB  30 mmol Intravenous Once  . DISCONTD: IMMUNE GLOBULIN 10% (HUMAN) IV -  For Fluid Restriction Only  0.5 g/kg Intravenous QODAY  . DISCONTD: insulin aspart  0-4 Units Subcutaneous Q4H  . DISCONTD: Ribavirin  400 mg Per Tube TID    Assessment/Recs:  Pt is a 56 y.o. yo male s/p renal transplant who was admitted on 06/03/2011 with PNA pulm HTN, volume overload now with acute on chronic renal transplant failure requiring CRRT  1. PNA/parainfluenza/cardiomyopathy/hypotension- completed RX for paraflu.  Has trach and requiring vent support.  Slow imp in CXR  BP stable but soft no pressors.   2. 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 Citrate protocol stopped d/t alkalosis and CO2 normal now. Doing OK without anticoagulation except for filter clotting issues. 4K dialysate and daily supplement. Labs stable except requiring daily phos replacement. BP much better but still  quite a lot of volume on board by exam. Would like to keep CRRT going to facilitate further fluid removal since tolerating neg 150/hr very well and CXR improving slowly.   Weights down as follows: 06/19/11 114.7 (does not fit with yesterday's UF)  06/18/11 0500 120.2 kg  06/17/11 0500 124.4 kg  06/16/11 0600 127.3 kg  06/15/11 0400 130.3 kg  06/14/11 0400 ! 136.4 kg  06/13/11 0500 ! 139.3 kg  06/12/11 0500 ! 142.2 kg  Regarding renal transplant, continuing his home dose of sirolimus and stress dose steroids which were decreased 5/15. Baseline function was not great with creatinine of 2. Minimal UOP, may not recover. Still on steroids and sirolimus. Will need tunnelled catheter later this week if possible when CCM feels able   3. Anemia- hgb stable today. Aranesp. Iron.   4. Secondary hyperparathyroidism- no treatment right now, rocaltrol stopped - not essential at this time and calcium up   5. HTN/volume very overloaded appearing still. Tolerating volume removal and does seem to have responded. Continue negative 150/hour   6. Hypophosphatemia- has been requiring daily repletion 20-30 mm/day  Reordered for today 7. PNA parainfluenza- finished 8. Fever, rising WBC - ? Lines ? cellulitus LE  ID following await recommendations    06/19/2011,8:43 AM  LOS: 16 days

## 2011-06-19 NOTE — Progress Notes (Signed)
ASSName: Chad [name] MRN: [id number] DOB: 04-11-1955    LOS: 16  PCCM  NOTE  History of Present Illness: [name] s/p renal Tx, immune suppressed, adm 5/08 to [facility] with 3 days of cough, shortness of breath, fever with PNA, sepsis, acute on chronic renal failure. Progressive resp failure resulting in intubation 5/8.  56 yo male admitted 06/03/2011 with cough, dyspnea and fever.  Progressed to [facility] 05/08 and PCCM consulted.  Has hx of renal transplant. PMHx Hep C, HTN, Gout  Cultures: BAL bronch 5/8>>>ng    AFB >> neg   Viral panel >> POS parainfluenza virus   Pneumocystis >> neg   Fungus >> neg   HSV >> neg BC 5/7>>>neg  Blood X 2 5/12 >> neg  Antibiotics: (per ID) 5/7 levoflox>>>5/10 5/7 bactrim >>>5/10 5/7 micafungin >> 5/14 5/7 vanc >> 5/10 5/10 cefepime >> 5/15 5/15 Ribaviran/IVIG >>Completed  Tests / Events: 5/8 hypoxic resp failure, intubated, near arrest 5/11 worsening shock , on sepsis protocol + dobut for low co-ox 5/15 Family mtg: continue full aggressive support but NCB if arrests. To reassess in 4-5 days. If no progress, consider withdrawal at that time 5/15 Trial of Ribaviran/IVIG 5/16 Markedly improved - reduced pressors, improved cognition, gas exchange improved 5/17 continued improvement. Tolerates PS 5 cm H2O with Vt > 800cc 5/17- extubated, re intubated 5/20- trach  Subjective: Tolerating pressure support.  Denies chest pain.  Has mild abdominal discomfort.  Vital Signs: Temp:  [98 F (36.7 C)-100.5 F (38.1 C)] 99.1 F (37.3 C) (05/23 0755) Pulse Rate:  [51-114] 104  (05/23 1245) Resp:  [18-34] 34  (05/23 1245) BP: (94-132)/(61-95) 104/88 mmHg (05/23 1245) SpO2:  [95 %-100 %] 100 % (05/23 1245) FiO2 (%):  [40 %] 40 % (05/23 1140) Weight:  [252 lb 13.9 oz (114.7 kg)] 252 lb 13.9 oz (114.7 kg) (05/23 0500) I/O last 3 completed shifts: In: 3179.2 [I.V.:1321.2; NG/GT:1490; IV Piggyback:368] Out: 8080 [Urine:79; EXBMW:4132;  Stool:1075]   Physical Examination: General - no distress HEENT - trach site clean, PANDA in place Cardiac - s1s2 regular, no murmur Chest - scattered rhonchi Abd - soft, non tender, +bowel sounds Ext - 3+edema Neuro - alert, follows commands, moves all extremities   Ventilator settings: Vent Mode:  [-] PSV FiO2 (%):  [40 %] 40 % Set Rate:  [20 bmp] 20 bmp Vt Set:  [600 mL] 600 mL PEEP:  [5 cmH20] 5 cmH20 Pressure Support:  [5 cmH20-10 cmH20] 5 cmH20 Plateau Pressure:  [14 cmH20-18 cmH20] 16 cmH20   CBC    Component Value Date/Time   WBC 23.6* 06/18/2011 1545   RBC 2.58* 06/18/2011 1545   HGB 7.9* 06/18/2011 1545   HCT 27.1* 06/18/2011 1545   PLT 99* 06/18/2011 1545   MCV 105.0* 06/18/2011 1545   MCH 30.6 06/18/2011 1545   MCHC 29.2* 06/18/2011 1545   RDW 23.1* 06/18/2011 1545   LYMPHSABS 2.1 06/18/2011 1545   MONOABS 2.4* 06/18/2011 1545   EOSABS 0.0 06/18/2011 1545   BASOSABS 0.2* 06/18/2011 1545    BMET    Component Value Date/Time   NA 135 06/19/2011 0435   K 4.0 06/19/2011 0435   CL 99 06/19/2011 0435   CO2 26 06/19/2011 0435   GLUCOSE 96 06/19/2011 0435   BUN 39* 06/19/2011 0435   CREATININE 1.58* 06/19/2011 0435   CALCIUM 8.3* 06/19/2011 0435   GFRNONAA 48* 06/19/2011 0435   GFRAA 56* 06/19/2011 0435    Dg Chest Port  1 View  06/18/2011  *RADIOLOGY REPORT*  Clinical Data: Left IJ line placement.  PORTABLE CHEST - 1 VIEW  Comparison: 06/18/2011 at 5:31 a.m.  Findings: Right internal jugular central venous catheter tip projects over the SVC.  The left internal jugular central venous catheters present with tip projecting over the SVC.  Tracheostomy tube projects over the tracheal shadow.  Feeding tube extends down into the stomach.  Stable cardiomegaly noted with interstitial accentuation suggesting interstitial edema, slightly improved compared to the earlier exam. No pneumothorax observed.  IMPRESSION:  1.  Bilateral IJ lines, with both line tip projecting over the SVC. No  pneumothorax. 2.  Cardiomegaly with slightly improved interstitial edema.  Original Report Authenticated By: Dellia Cloud, M.D.   Dg Chest Port 1 View  06/18/2011  *RADIOLOGY REPORT*  Clinical Data: Assess for pulmonary edema.  PORTABLE CHEST - 1 VIEW  Comparison: Chest x-ray 06/17/2011.  Findings: A tracheostomy tube is in place with tip 2.9 cm above the carina. There is a left-sided internal jugular central venous catheter with tip terminating in the distal superior vena cava. There is a right-sided internal jugular central venous catheter with tip terminating in the distal superior vena cava. A feeding tube is seen extending into the abdomen, however, the tip of the feeding tube extends below the lower margin of the image. Lung volumes are low. There is cephalization of the pulmonary vasculature, indistinctness of the interstitial markings, and patchy airspace disease throughout the lungs bilaterally suggestive of moderate pulmonary edema.  Bibasilar opacities are favored to represent areas of subsegmental atelectasis.  No definite pleural effusions.  Moderate enlargement of the cardiopericardial silhouette is similar to priors. The patient is rotated to the right on today's exam, resulting in distortion of the mediastinal contours and reduced diagnostic sensitivity and specificity for mediastinal pathology.  Atherosclerotic calcifications within the arch of the aorta.  IMPRESSION: 1.  Support apparatus, as above. 2.  Slight improvement in what is now moderate pulmonary edema. 3.  Moderate enlargement of the cardiopericardial silhouette may reflect underlying cardiomegaly and/or the presence of a pericardial effusion. 4.  Atherosclerosis.  Original Report Authenticated By: Florencia Reasons, M.D.   Dg Abd Portable 1v  06/19/2011  *RADIOLOGY REPORT*  Clinical Data: Pain, increased white count, dialysis  PORTABLE ABDOMEN - 1 VIEW  Comparison: Jun 09, 2011  Findings: The feeding tube tip is likely at  the duodenal/jejunal junction.  The stool and bowel gas pattern is within normal limits with no evidence of obstruction.  No gross pneumoperitoneum.  Left hip prosthesis noted.  IMPRESSION: Normal stool and bowel gas pattern.  Original Report Authenticated By: Brandon Melnick, M.D.     Assessment and Plan:  PULMONARY  Bronchoscopy 05/08>>diffuse, thick secretions ETT 5/8 >>5/17>>>retubed 6 hrs>>>5/20 Trach 5/20 (df)>>>  Acute respiratory failure 2nd to pneumonia, ARDS.  Required trach 05/20. Plan: -f/u CXR -pressure support wean as tolerated   CARDIAC Lt IJ CVL 05/08>> Rt Radial aline 05/08>>out R IJ HD cath 5/12 >> 5/13 (inadvertent removal) R IJ HD cath 5/13 >>   5/08 Doppler legs>>negative for DVT 5/12 Echo>>EF 15 to 20%, mild LVH, diffuse hypokinesis, mild MR, mod/severe LA dilation, mod RV systolic dysfx, PAS 52 mmHg 5/23 Doppler legs>>  Shock>>likely septic and cardiogenic.  Resolved.  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 Plan: -continue amiodarone per cardiology   RENAL  Acute on chronic renal failure with hx  of renal transplant.  CVVH started 5/12. Plan: -continue renal replacement per nephrology -vascular surgery consulted for tunneled catheter insertion -continue sirolimus -can transition from solucortef to prednisone 10 mg daily 5/23   ID  Blood 5/23>> C diff 5/23>>  Vancomycin 5/23>>  Parainfluenza pneumonia.  Given IVIG 5/15. Plan: -started ribavarin 5/16 per ID>>completed 7 day course -other Abx completed 5/15  Cellulitis. -Vancomycin started 5/23 -preliminary report for doppler legs 5/23>>negative for DVT b/l lower extremities   HEMATOLOGY  Anemia of critical illness, chronic disease, and renal failure. Plan: -f/u CBC -transfuse for Hb < 7 -continue nulecit, aranesp per renal  Leukocytosis, thrombocytopenia. Plan: -f/u  CBC   ENDOCRINE  Steroid induced hyperglycemia. Plan: -monitor blood sugar on BMET   NEUROLOGY  Mild agitation.  Improved. Plan: -monitor mental status   GASTROENTEROLOGY  Nurtition. Plan: -continue tube feeds  Hx of Hepatitis C. Plan: -f/u LFT intermittently   BEST PRACTICE -BID protonix for SUP -SCD for DVT prophylaxis -Full code  Critical care time 40 minutes.  Coralyn Helling, MD 06/19/2011, 2:14 PM Pager:  [phone]

## 2011-06-19 NOTE — Progress Notes (Signed)
ANTIBIOTIC CONSULT NOTE - INITIAL  Pharmacy Consult for Vancomycin Indication: Cellulitis, sepsis  No Known Allergies  Patient Measurements: Height: 5\' 11"  (180.3 cm) Weight: 252 lb 13.9 oz (114.7 kg) IBW/kg (Calculated) : 75.3   Vital Signs: Temp: 99.1 F (37.3 C) (05/23 0755) Temp src: Oral (05/23 0755) BP: 116/72 mmHg (05/23 0900) Pulse Rate: 109  (05/23 0900) Intake/Output from previous day: 05/22 0701 - 05/23 0700 In: 2358.8 [I.V.:880.8; NG/GT:1110; IV Piggyback:368] Out: 5771 [Urine:79; Stool:875] Intake/Output from this shift: Total I/O In: 200.1 [I.V.:110.1; NG/GT:90] Out: 445 [Other:445]  Labs:  [facility] 06/19/11 0435 06/18/11 1545 06/18/11 0522 06/17/11 1349 06/17/11 0425  WBC -- 23.6* -- 15.6* 17.0*  HGB -- 7.9* -- 8.1* 7.9*  PLT -- 99* -- 81* 69*  LABCREA -- -- -- -- --  CREATININE 1.58* 1.65* 1.58* -- --   Estimated Creatinine Clearance: 68.9 ml/min (by C-G formula based on Cr of 1.58). No results found for this basename: VANCOTROUGH:2,VANCOPEAK:2,VANCORANDOM:2,GENTTROUGH:2,GENTPEAK:2,GENTRANDOM:2,TOBRATROUGH:2,TOBRAPEAK:2,TOBRARND:2,AMIKACINPEAK:2,AMIKACINTROU:2,AMIKACIN:2, in the last 72 hours   Microbiology: Recent Results (from the past 720 hour(s))  URINE CULTURE     Status: Normal   Collection Time   06/03/11 10:53 PM      Component Value Range Status Comment   Specimen Description URINE, RANDOM   Final    Special Requests ADDED [id number]   Final    Culture  Setup Time [id number]   Final    Colony Count >=100,000 COLONIES/ML   Final    Culture     Final    Value: Multiple bacterial morphotypes present, none predominant. Suggest appropriate recollection if clinically indicated.   Report Status 06/05/2011 FINAL   Final   CULTURE, BLOOD (ROUTINE X 2)     Status: Normal   Collection Time   06/03/11 11:45 PM      Component Value Range Status Comment   Specimen Description BLOOD RIGHT ARM   Final    Special Requests BOTTLES DRAWN AEROBIC AND  ANAEROBIC 10CC   Final    Culture  Setup Time [id number]   Final    Culture NO GROWTH 5 DAYS   Final    Report Status 06/10/2011 FINAL   Final   CULTURE, BLOOD (ROUTINE X 2)     Status: Normal   Collection Time   06/03/11 11:50 PM      Component Value Range Status Comment   Specimen Description BLOOD RIGHT ARM   Final    Special Requests     Final    Value: BOTTLES DRAWN AEROBIC AND ANAEROBIC 5CC AEROBOC 3CC ANAEROBIC   Culture  Setup Time [id number]   Final    Culture NO GROWTH 5 DAYS   Final    Report Status 06/10/2011 FINAL   Final   MRSA PCR SCREENING     Status: Normal   Collection Time   06/04/11  3:08 AM      Component Value Range Status Comment   MRSA by PCR NEGATIVE  NEGATIVE  Final   RESPIRATORY VIRUS PANEL (18 COMPONENTS)     Status: Abnormal   Collection Time   06/04/11  6:09 AM      Component Value Range Status Comment   Source - RVPAN NASAL MUCOSA   Final    Respiratory Syncytial Virus A NOT DETECTED   Final    Respiratory Syncytial Virus B NOT DETECTED   Final    Influenza A NOT DETECTED   Final    Influenza B NOT DETECTED   Final  Parainfluenza 1 NOT DETECTED   Final    Parainfluenza 2 NOT DETECTED   Final    Parainfluenza 3 DETECTED (*)  Final    Parainfluenza 4 NOT DETECTED   Final    Metapneumovirus NOT DETECTED   Final    Coxsackie and Echovirus NOT DETECTED   Final    Rhinovirus NOT DETECTED   Final    Adenovirus B NOT DETECTED   Final    Adenovirus E NOT DETECTED   Final    CoronavirusNL63 NOT DETECTED   Final    CoronavirusHKU1 NOT DETECTED   Final    Coronavirus229E NOT DETECTED   Final    CoronavirusOC43 NOT DETECTED   Final   CULTURE, EXPECTORATED SPUTUM-ASSESSMENT     Status: Normal   Collection Time   06/04/11  7:48 AM      Component Value Range Status Comment   Specimen Description SPUTUM   Final    Special Requests NONE   Final    Sputum evaluation     Final    Value: MICROSCOPIC FINDINGS SUGGEST THAT THIS SPECIMEN IS NOT REPRESENTATIVE OF  LOWER RESPIRATORY SECRETIONS. PLEASE RECOLLECT.     CALLED TO D ORTIZ,RN 06/04/11 0911 BY K SCHULTZ   Report Status 06/04/2011 FINAL   Final   AFB CULTURE WITH SMEAR     Status: Normal (Preliminary result)   Collection Time   06/04/11 10:30 AM      Component Value Range Status Comment   Specimen Description BRONCHIAL WASHINGS   Final    Special Requests NONE   Final    ACID FAST SMEAR NO ACID FAST BACILLI SEEN   Final    Culture     Final    Value: CULTURE WILL BE EXAMINED FOR 6 WEEKS BEFORE ISSUING A FINAL REPORT   Report Status PENDING   Incomplete   PNEUMOCYSTIS JIROVECI SMEAR BY DFA     Status: Normal   Collection Time   06/04/11 10:30 AM      Component Value Range Status Comment   Specimen Source-PJSRC BRONCHIAL WASHINGS   Final    Pneumocystis jiroveci Ag NEGATIVE   Final Performed at [facility]  FUNGUS CULTURE W SMEAR     Status: Normal (Preliminary result)   Collection Time   06/04/11 10:30 AM      Component Value Range Status Comment   Specimen Description BRONCHIAL WASHINGS   Final    Special Requests NONE   Final    Fungal Smear NO YEAST OR FUNGAL ELEMENTS SEEN   Final    Culture CULTURE IN PROGRESS FOR FOUR WEEKS   Final    Report Status PENDING   Incomplete   CULTURE, RESPIRATORY     Status: Normal   Collection Time   06/04/11 10:30 AM      Component Value Range Status Comment   Specimen Description BRONCHIAL WASHINGS   Final    Special Requests NONE   Final    Gram Stain     Final    Value: FEW WBC PRESENT,BOTH PMN AND MONONUCLEAR     NO SQUAMOUS EPITHELIAL CELLS SEEN     NO ORGANISMS SEEN   Culture NO GROWTH 2 DAYS   Final    Report Status 06/07/2011 FINAL   Final   HSV PCR     Status: Normal   Collection Time   06/04/11 10:30 AM      Component Value Range Status Comment   HSV, PCR  Not Detected  Not Detected  Final    HSV 2 , PCR Not Detected  Not Detected  Final    Specimen Source-HSVPCR NO GROWTH   Final   CULTURE, BLOOD (ROUTINE X 2)     Status:  Normal   Collection Time   06/04/11  3:15 PM      Component Value Range Status Comment   Specimen Description BLOOD RIGHT HAND   Final    Special Requests BOTTLES DRAWN AEROBIC AND ANAEROBIC 10CC   Final    Culture  Setup Time [id number]   Final    Culture NO GROWTH 5 DAYS   Final    Report Status 06/11/2011 FINAL   Final   CULTURE, BLOOD (ROUTINE X 2)     Status: Normal   Collection Time   06/04/11  3:27 PM      Component Value Range Status Comment   Specimen Description BLOOD LEFT HAND   Final    Special Requests BOTTLES DRAWN AEROBIC ONLY 3CC   Final    Culture  Setup Time [id number]   Final    Culture NO GROWTH 5 DAYS   Final    Report Status 06/11/2011 FINAL   Final   CULTURE, BLOOD (ROUTINE X 2)     Status: Normal   Collection Time   06/08/11  4:15 AM      Component Value Range Status Comment   Specimen Description BLOOD LEFT HAND   Final    Special Requests BOTTLES DRAWN AEROBIC ONLY 10CC   Final    Culture  Setup Time [id number]   Final    Culture NO GROWTH 5 DAYS   Final    Report Status 06/14/2011 FINAL   Final   CULTURE, BLOOD (ROUTINE X 2)     Status: Normal   Collection Time   06/08/11  4:20 AM      Component Value Range Status Comment   Specimen Description BLOOD LEFT HAND   Final    Special Requests BOTTLES DRAWN AEROBIC ONLY 10CC   Final    Culture  Setup Time [id number]   Final    Culture NO GROWTH 5 DAYS   Final    Report Status 06/14/2011 FINAL   Final   RESPIRATORY VIRUS PANEL (18 COMPONENTS)     Status: Abnormal   Collection Time   06/11/11  8:33 PM      Component Value Range Status Comment   Source - RVPAN BRONCHIAL ALVEOLAR LAVAGE   Final    Respiratory Syncytial Virus A NOT DETECTED   Final    Respiratory Syncytial Virus B NOT DETECTED   Final    Influenza A NOT DETECTED   Final    Influenza B NOT DETECTED   Final    Parainfluenza 1 NOT DETECTED   Final    Parainfluenza 2 NOT DETECTED   Final    Parainfluenza 3 DETECTED (*)  Final     Parainfluenza 4 NOT DETECTED   Final    Metapneumovirus NOT DETECTED   Final    Coxsackie and Echovirus NOT DETECTED   Final    Rhinovirus NOT DETECTED   Final    Adenovirus B NOT DETECTED   Final    Adenovirus E NOT DETECTED   Final    CoronavirusNL63 NOT DETECTED   Final    CoronavirusHKU1 NOT DETECTED   Final    Coronavirus229E NOT DETECTED   Final    CoronavirusOC43 NOT DETECTED   Final  CLOSTRIDIUM DIFFICILE BY PCR     Status: Normal   Collection Time   06/13/11  1:14 PM      Component Value Range Status Comment   C difficile by pcr NEGATIVE  NEGATIVE  Final     Medical History: Past Medical History  Diagnosis Date  . Hypertension   . H/O kidney transplant    Assessment: Chad [name] is known to pharmacy from prior abx dosing. Noted patient continues to have elevated WBC. He has only had low grade fever, but this could be d/t ongoing CRRT.  Noted concerns for abd wall cellulitis or new line sepsis (blood cx sent), will re-start Vanc with high trough goal in the setting of ongoing illness and until bacteremia is ruled out (if only treating for cellulitis, would downgrade trough goal to 10-15 mcg/ml).  5/7 Vanc >> 5/14 5/7 Levaquin > 5/10 5/7 Mycamine > 5/10, 5/12>>5/14 5/7 Bactrim > 5/10 5/8 Cefepime 5/8 >>5/16 5/11 Zosyn >> 5/12  5/16 Ribavirin >> 5/23 5/23 Vanc >>  5/16 IVIG QOD >> 5/23  5/7 Urine: insignificant 5/7 Blood: negative 5/8 BAL: neg (AFB, fungal smear, PCP) 5/8 HSV PCR: neg 5/8 Blood: neg 5/8 Resp virus panel: Parainfluenza 3 positive 5/12: BC x 2: Negative 5/17: Cdiff: Negative 5/23 Blood: 5/23: Cdiff:  Goal of Therapy:  Vancomycin trough level 15-20 mcg/ml  Plan:  Measure antibiotic drug levels at steady state Follow up culture results Will f/up for antibiotic duration of therapy - Vanc 1750mg  IV now, then 1250 IV q 24h while on CRRT  Mirna Mires K 06/19/2011,10:08 AM

## 2011-06-19 NOTE — Progress Notes (Addendum)
INFECTIOUS DISEASE PROGRESS NOTE  ID: Chad [name] is a 56 y.o. male with   Principal Problem:  *Septic shock Active Problems:  HCAP (healthcare-associated pneumonia)  Thrombocytopenia  Acute respiratory failure  ARDS (adult respiratory distress syndrome)  Renal transplant disorder  Acute systolic heart failure  Acute renal failure  Cardiomyopathy  Subjective: C/o abd discomfort  Abtx:  Anti-infectives     Start     Dose/Rate Route Frequency Ordered Stop   06/13/11 1200   vancomycin (VANCOCIN) 50 mg/mL oral solution 500 mg  Status:  Discontinued        500 mg Per Tube 4 times per day 06/13/11 1045 06/13/11 1600   06/13/11 1200   metroNIDAZOLE (FLAGYL) IVPB 500 mg  Status:  Discontinued        500 mg 100 mL/hr over 60 Minutes Intravenous Every 8 hours 06/13/11 1045 06/13/11 1600   06/12/11 1000   Ribavirin SOLN 400 mg  Status:  Discontinued     Comments: Special order item.       400 mg Per Tube 3 times daily 06/11/11 1346 06/18/11 1553   06/11/11 1600   ribavirin (REBETOL) capsule 400 mg  Status:  Discontinued        400 mg Oral 3 times daily 06/11/11 1239 06/11/11 1343   06/08/11 2200   ceFEPIme (MAXIPIME) 2 g in dextrose 5 % 50 mL IVPB     Comments: Use Cefepime 1 g IV q12h for CrCl< 60 mL/min      2 g 100 mL/hr over 30 Minutes Intravenous Every 12 hours 06/08/11 1536 06/11/11 2320   06/08/11 0800   piperacillin-tazobactam (ZOSYN) IVPB 3.375 g  Status:  Discontinued        3.375 g 12.5 mL/hr over 240 Minutes Intravenous Every 8 hours 06/08/11 0149 06/08/11 1155   06/08/11 0600   micafungin (MYCAMINE) 100 mg in sodium chloride 0.9 % 100 mL IVPB  Status:  Discontinued        100 mg 100 mL/hr over 1 Hours Intravenous Daily 06/08/11 0308 06/10/11 1703   06/08/11 0200  piperacillin-tazobactam (ZOSYN) IVPB 3.375 g       3.375 g 100 mL/hr over 30 Minutes Intravenous  Once 06/08/11 0149 06/08/11 0254   06/06/11 0600   vancomycin (VANCOCIN) 1,750 mg in sodium chloride  0.9 % 500 mL IVPB  Status:  Discontinued        1,750 mg 250 mL/hr over 120 Minutes Intravenous Every 24 hours 06/05/11 0906 06/08/11 0403   06/05/11 2200   levofloxacin (LEVAQUIN) IVPB 750 mg  Status:  Discontinued     Comments: Use Levaquin 750 mg IV q48h for CrCl < 19mL/min      750 mg 100 mL/hr over 90 Minutes Intravenous Every 48 hours 06/04/11 0344 06/05/11 0908   06/05/11 1000   levofloxacin (LEVAQUIN) IVPB 750 mg  Status:  Discontinued     Comments: Use Levaquin 750 mg IV q48h for CrCl < 17mL/min      750 mg 100 mL/hr over 90 Minutes Intravenous Every 24 hours 06/05/11 0908 06/06/11 0908   06/04/11 1200   sulfamethoxazole-trimethoprim (BACTRIM) 664 mg in dextrose 5 % 500 mL IVPB  Status:  Discontinued        20 mg/kg/day  132.7 kg 361 mL/hr over 90 Minutes Intravenous 4 times per day 06/04/11 1039 06/06/11 0908   06/04/11 1100   micafungin (MYCAMINE) 100 mg in sodium chloride 0.9 % 100 mL IVPB  Status:  Discontinued  100 mg 100 mL/hr over 1 Hours Intravenous Daily 06/04/11 1030 06/06/11 0908   06/04/11 0600   ceFEPIme (MAXIPIME) 1 g in dextrose 5 % 50 mL IVPB  Status:  Discontinued     Comments: Use Cefepime 1 g IV q12h for CrCl< 60 mL/min      1 g 100 mL/hr over 30 Minutes Intravenous Every 12 hours 06/04/11 0344 06/08/11 1536   06/04/11 0600   vancomycin (VANCOCIN) 1,500 mg in sodium chloride 0.9 % 500 mL IVPB  Status:  Discontinued        1,500 mg 250 mL/hr over 120 Minutes Intravenous Every 24 hours 06/04/11 0425 06/05/11 0906   06/04/11 0430   levofloxacin (LEVAQUIN) IVPB 750 mg        750 mg 100 mL/hr over 90 Minutes Intravenous  Once 06/04/11 0354 06/04/11 0632   06/03/11 2330   vancomycin (VANCOCIN) IVPB 1000 mg/200 mL premix        1,000 mg 200 mL/hr over 60 Minutes Intravenous  Once 06/03/11 2326 06/04/11 0051   06/03/11 2330  piperacillin-tazobactam (ZOSYN) IVPB 3.375 g       3.375 g 12.5 mL/hr over 240 Minutes Intravenous  Once 06/03/11 2326  06/04/11 0521          Medications:  Scheduled:   . antiseptic oral rinse  15 mL Mouth Rinse QID  . chlorhexidine  15 mL Mouth Rinse BID  . collagenase   Topical Daily  . darbepoetin (ARANESP) injection - DIALYSIS  100 mcg Intravenous Q Wed-HD  . feeding supplement (OXEPA)  1,000 mL Per Tube Q24H  . feeding supplement  60 mL Per Tube QID  . ferric gluconate (FERRLECIT/NULECIT) IV  125 mg Intravenous Daily  . hydrocortisone sod succinate (SOLU-CORTEF) injection  25 mg Intravenous Daily  . mulitivitamin  5 mL Per Tube Daily  . pantoprazole sodium  40 mg Per Tube BID  . potassium chloride  40 mEq Oral Daily  . sirolimus  0.75 mg Oral Daily  . sodium phosphate  Dextrose 5% IVPB  30 mmol Intravenous Once  . sodium phosphate  Dextrose 5% IVPB  30 mmol Intravenous Once  . DISCONTD: IMMUNE GLOBULIN 10% (HUMAN) IV - For Fluid Restriction Only  0.5 g/kg Intravenous QODAY  . DISCONTD: insulin aspart  0-4 Units Subcutaneous Q4H  . DISCONTD: Ribavirin  400 mg Per Tube TID    Objective: Vital signs in last 24 hours: Temp:  [98 F (36.7 C)-100.5 F (38.1 C)] 99.1 F (37.3 C) (05/23 0755) Pulse Rate:  [51-114] 84  (05/23 0813) Resp:  [14-27] 27  (05/23 0813) BP: (94-132)/(61-95) 110/69 mmHg (05/23 0813) SpO2:  [95 %-100 %] 100 % (05/23 0813) FiO2 (%):  [40 %] 40 % (05/23 0813) Weight:  [114.7 kg (252 lb 13.9 oz)] 114.7 kg (252 lb 13.9 oz) (05/23 0500)   General appearance: alert, cooperative and no distress Eyes: negative findings: EOMI Neck: limited exam of trach site shows it to be clean. his R HD line is clean Resp: clear to auscultation bilaterally Cardio: regular rate and rhythm GI: normal findings: bowel sounds normal and soft, non-tender Extremities: edema >3+ and erythema at lower extremities/ankles. there is midl tenderness as well.  There is no rash on his abd (he localizes his pain to supra-pubic on my exam).  Lab Results  Basename 06/19/11 0435 06/18/11 1545 06/17/11  1349  WBC -- 23.6* 15.6*  HGB -- 7.9* 8.1*  HCT -- 27.1* 28.9*  NA 135 135 --  K 4.0 4.0 --  CL 99 99 --  CO2 26 26 --  BUN 39* 39* --  CREATININE 1.58* 1.65* --  GLU -- -- --   Liver Panel  Basename 06/19/11 0435 06/18/11 1545 06/17/11 0425  PROT -- -- 7.8  ALBUMIN 2.1* 2.0* --  AST -- -- 126*  ALT -- -- 52  ALKPHOS -- -- 93  BILITOT -- -- 2.0*  BILIDIR -- -- --  IBILI -- -- --   Sedimentation Rate No results found for this basename: ESRSEDRATE in the last 72 hours C-Reactive Protein No results found for this basename: CRP:2 in the last 72 hours  Microbiology: Recent Results (from the past 240 hour(s))  RESPIRATORY VIRUS PANEL (18 COMPONENTS)     Status: Abnormal   Collection Time   06/11/11  8:33 PM      Component Value Range Status Comment   Source - RVPAN BRONCHIAL ALVEOLAR LAVAGE   Final    Respiratory Syncytial Virus A NOT DETECTED   Final    Respiratory Syncytial Virus B NOT DETECTED   Final    Influenza A NOT DETECTED   Final    Influenza B NOT DETECTED   Final    Parainfluenza 1 NOT DETECTED   Final    Parainfluenza 2 NOT DETECTED   Final    Parainfluenza 3 DETECTED (*)  Final    Parainfluenza 4 NOT DETECTED   Final    Metapneumovirus NOT DETECTED   Final    Coxsackie and Echovirus NOT DETECTED   Final    Rhinovirus NOT DETECTED   Final    Adenovirus B NOT DETECTED   Final    Adenovirus E NOT DETECTED   Final    CoronavirusNL63 NOT DETECTED   Final    CoronavirusHKU1 NOT DETECTED   Final    Coronavirus229E NOT DETECTED   Final    CoronavirusOC43 NOT DETECTED   Final   CLOSTRIDIUM DIFFICILE BY PCR     Status: Normal   Collection Time   06/13/11  1:14 PM      Component Value Range Status Comment   C difficile by pcr NEGATIVE  NEGATIVE  Final     Studies/Results: Dg Chest Port 1 View  06/18/2011  *RADIOLOGY REPORT*  Clinical Data: Left IJ line placement.  PORTABLE CHEST - 1 VIEW  Comparison: 06/18/2011 at 5:31 a.m.  Findings: Right internal jugular  central venous catheter tip projects over the SVC.  The left internal jugular central venous catheters present with tip projecting over the SVC.  Tracheostomy tube projects over the tracheal shadow.  Feeding tube extends down into the stomach.  Stable cardiomegaly noted with interstitial accentuation suggesting interstitial edema, slightly improved compared to the earlier exam. No pneumothorax observed.  IMPRESSION:  1.  Bilateral IJ lines, with both line tip projecting over the SVC. No pneumothorax. 2.  Cardiomegaly with slightly improved interstitial edema.  Original Report Authenticated By: Dellia Cloud, M.D.   Dg Chest Port 1 View  06/18/2011  *RADIOLOGY REPORT*  Clinical Data: Assess for pulmonary edema.  PORTABLE CHEST - 1 VIEW  Comparison: Chest x-ray 06/17/2011.  Findings: A tracheostomy tube is in place with tip 2.9 cm above the carina. There is a left-sided internal jugular central venous catheter with tip terminating in the distal superior vena cava. There is a right-sided internal jugular central venous catheter with tip terminating in the distal superior vena cava. A feeding tube is seen extending into the abdomen, however, the  tip of the feeding tube extends below the lower margin of the image. Lung volumes are low. There is cephalization of the pulmonary vasculature, indistinctness of the interstitial markings, and patchy airspace disease throughout the lungs bilaterally suggestive of moderate pulmonary edema.  Bibasilar opacities are favored to represent areas of subsegmental atelectasis.  No definite pleural effusions.  Moderate enlargement of the cardiopericardial silhouette is similar to priors. The patient is rotated to the right on today's exam, resulting in distortion of the mediastinal contours and reduced diagnostic sensitivity and specificity for mediastinal pathology.  Atherosclerotic calcifications within the arch of the aorta.  IMPRESSION: 1.  Support apparatus, as above. 2.   Slight improvement in what is now moderate pulmonary edema. 3.  Moderate enlargement of the cardiopericardial silhouette may reflect underlying cardiomegaly and/or the presence of a pericardial effusion. 4.  Atherosclerosis.  Original Report Authenticated By: Florencia Reasons, M.D.     Assessment/Plan: Renal Txp (sirolimus, hydrocortisone)  On CRRT  Pneumonia (parainfluenza), viral CM?  Off ribavirin, IVIg, anbx     Recheck BCx. His respiratory status and previous CXR do not suggest pna. Not making significant urine.  Lines change if possible Cellulitis-will start vanco, check LE dopplers Abd pain-  C diff (-) 5-17, will recheck. Check flat plate of abd (consider CT?)  Johny Sax Infectious Diseases [phone] 06/19/2011, 9:07 AM   LOS: 16 days

## 2011-06-19 NOTE — Consult Note (Signed)
[facility] CONSULT NOTE 06/19/2011 DOB: [id number] MRN : [id number]  CC: Acute renal transplant failure Referring Physician: Dr. Eliott Nine  History of Present Illness: A 56 year old gentleman with history of renal transplant who has been doing fine coming in with 3 days of cough, shortness of breath and fever. He has been on immunosuppressants for his renal transplant. Patient has not had any known sick contacts. He had associated nausea and vomiting but no diarrhea no constipation no bright red blood per rectum. He has bilateral lymphedema which has remained for the most part within range of what he normally has. He has some slight chest discomfort but not frank chest pain. He is having cough which is slightly productive of white sputum. In the ED patient was found to be severely hypoxic requiring nonrebreather bag. His chest x-ray showed findings consistent with a pneumonia.  Emergent bronch done, bloody secretions and pus throughout 06-04-2011.  He was started on antibiotics for for empiric fungal and PCP treatment. We are being consulted for dialysis access.  Regarding renal transplant, continuing his home dose of sirolimus and stress dose steroids which were decreased 5/15. Baseline function was not great with creatinine of 2. Minimal UOP, may not recover. Still on steroids and sirolimus. Will need tunnelled catheter later this week if possible when CCM feels able       Past Medical History  Diagnosis Date  . Hypertension   . H/O kidney transplant     Past Surgical History  Procedure Date  . Nephrectomy transplanted organ      ROS: [x]  Positive  [ ]  Denies    Not obtained due to tracheostomy and ventilator.  General: [ ]  Weight loss, [ ]  Fever, [ ]  chills Neurologic: [ ]  Dizziness, [ ]  Blackouts, [ ]  Seizure [ ]  Stroke, [ ]  "Mini stroke", [ ]  Slurred speech, [ ]  Temporary blindness; [ ]  weakness in arms or legs, [ ]  Hoarseness Cardiac: [ ]  Chest  pain/pressure, [ ]  Shortness of breath at rest [ ]  Shortness of breath with exertion, [ ]  Atrial fibrillation or irregular heartbeat Vascular: [ ]  Pain in legs with walking, [ ]  Pain in legs at rest, [ ]  Pain in legs at night,  [ ]  Non-healing ulcer, [ ]  Blood clot in vein/DVT,   Pulmonary: [ ]  Home oxygen, [ ]  Productive cough, [ ]  Coughing up blood, [ ]  Asthma,  [ ]  Wheezing Musculoskeletal:  [ ]  Arthritis, [ ]  Low back pain, [ ]  Joint pain Hematologic: [ ]  Easy Bruising, [ ]  Anemia; [ ]  Hepatitis Gastrointestinal: [ ]  Blood in stool, [ ]  Gastroesophageal Reflux/heartburn, [ ]  Trouble swallowing Urinary: [ ]  chronic Kidney disease, [ ]  on HD - [ ]  MWF or [ ]  TTHS, [ ]  Burning with urination, [ ]  Difficulty urinating Skin: [ ]  Rashes, [ ]  Wounds Psychological: [ ]  Anxiety, [ ]  Depression  Social History History  Substance Use Topics  . Smoking status: Never Smoker   . Smokeless tobacco: Not on file  . Alcohol Use: No    Family History History reviewed. No pertinent family history.  No Known Allergies  Current Facility-Administered Medications  Medication Dose Route Frequency Provider Last Rate Last Dose  . 0.9 %  sodium chloride infusion   Intravenous Continuous [name], MD 10 mL/hr at 06/16/11 0459    . albuterol (PROVENTIL) (5 MG/ML) 0.5% nebulizer solution 2.5 mg  2.5 mg Nebulization Q3H PRN Kalman Shan, MD   2.5  mg at 06/06/11 0803  . amiodarone (NEXTERONE PREMIX) 360 mg/200 mL dextrose IV infusion  0.5 mg/min Intravenous Continuous Dolores Patty, MD 16.7 mL/hr at 06/19/11 0807 0.5 mg/min at 06/19/11 0807  . antiseptic oral rinse (BIOTENE) solution 15 mL  15 mL Mouth Rinse QID [name], MD   15 mL at 06/19/11 0400  . chlorhexidine (PERIDEX) 0.12 % solution 15 mL  15 mL Mouth Rinse BID [name], MD   15 mL at 06/19/11 0740  . collagenase (SANTYL) ointment   Topical Daily [name], MD      . darbepoetin [facility]) injection 100 mcg   100 mcg Intravenous Q Wed-HD Cecille Aver, MD   100 mcg at 06/18/11 1514  . feeding supplement (OXEPA) liquid 1,000 mL  1,000 mL Per Tube Q24H Merwyn Katos, MD   1,000 mL at 06/18/11 1820  . feeding supplement (PRO-STAT SUGAR FREE 64) liquid 60 mL  60 mL Per Tube QID Hettie Holstein, RD   60 mL at 06/18/11 2201  . fentaNYL (SUBLIMAZE) injection 25 mcg  25 mcg Intravenous Q1H PRN [name], MD   25 mcg at 06/18/11 2342  . ferric gluconate (NULECIT) 125 mg in sodium chloride 0.9 % 100 mL IVPB  125 mg Intravenous Daily Sadie Haber, MD   125 mg at 06/18/11 1219  . hydrocortisone sodium succinate (SOLU-CORTEF) 100 mg/2 mL injection 25 mg  25 mg Intravenous Daily [name], MD   25 mg at 06/18/11 0912  . metoprolol (LOPRESSOR) injection 2.5-5 mg  2.5-5 mg Intravenous Q3H PRN Merwyn Katos, MD   2.5 mg at 06/16/11 0028  . mulitivitamin liquid 5 mL  5 mL Per Tube Daily Hettie Holstein, RD   5 mL at 06/18/11 0912  . pantoprazole sodium (PROTONIX) 40 mg/20 mL oral suspension 40 mg  40 mg Per Tube BID Merwyn Katos, MD   40 mg at 06/18/11 2300  . potassium chloride 20 MEQ/15ML (10%) liquid 40 mEq  40 mEq Oral Daily Cecille Aver, MD   40 mEq at 06/18/11 0912  . prismasol BGK 4/2.5 5,000 mL dialysis replacement fluid   CRRT Continuous Sadie Haber, MD 200 mL/hr at 06/19/11 [phone]    . prismasol BGK 4/2.5 5,000 mL dialysis replacement fluid   CRRT Continuous Sadie Haber, MD 500 mL/hr at 06/19/11 0300    . prismasol BGK 4/2.5 5,000 mL dialysis solution   CRRT Continuous Garnetta Buddy, MD 2,000 mL/hr at 06/19/11 [phone]    . sirolimus (RAPAMUNE) 1 MG/ML solution 0.75 mg  0.75 mg Oral Daily [name], MD   0.75 mg at 06/18/11 1101  . sodium phosphate 30 mmol in dextrose 5 % 250 mL infusion  30 mmol Intravenous Once Zigmund Gottron, MD   30 mmol at 06/18/11 0748  . sodium phosphate 30 mmol in dextrose 5 % 250 mL infusion  30 mmol  Intravenous Once Sadie Haber, MD      . vancomycin (VANCOCIN) 1,250 mg in sodium chloride 0.9 % 250 mL IVPB  1,250 mg Intravenous Q24H Meera K Patel, PHARMD      . vancomycin (VANCOCIN) 1,750 mg in sodium chloride 0.9 % 500 mL IVPB  1,750 mg Intravenous NOW Meera Concha Se, PHARMD      . DISCONTD: Immune Globulin (Human) SOLN 70 g  0.5 g/kg Intravenous QODAY Gardiner Barefoot, MD   70 g at 06/17/11 1042  .  DISCONTD: insulin aspart (novoLOG) injection 0-4 Units  0-4 Units Subcutaneous Q4H Kalman Shan, MD   1 Units at 06/17/11 0359  . DISCONTD: Ribavirin SOLN 400 mg  400 mg Per Tube TID [name], MD   400 mg at 06/18/11 1515     Significant Diagnostic Studies: CBC Lab Results  Component Value Date   WBC 23.6* 06/18/2011   HGB 7.9* 06/18/2011   HCT 27.1* 06/18/2011   MCV 105.0* 06/18/2011   PLT 99* 06/18/2011    BMET    Component Value Date/Time   NA 135 06/19/2011 0435   K 4.0 06/19/2011 0435   CL 99 06/19/2011 0435   CO2 26 06/19/2011 0435   GLUCOSE 96 06/19/2011 0435   BUN 39* 06/19/2011 0435   CREATININE 1.58* 06/19/2011 0435   CALCIUM 8.3* 06/19/2011 0435   GFRNONAA 48* 06/19/2011 0435   GFRAA 56* 06/19/2011 0435    COAG Lab Results  Component Value Date   INR 1.16 06/15/2011   INR 1.27 06/08/2011   INR 1.23 04/07/2009   No results found for this basename: PTT     Physical Examination Patient is alert on tracheostomy ventilator. Follows commands  Pulse Readings from Last 3 Encounters:  06/19/11 108    General:  WDWN in NAD HENT: WNL Eyes: Pupils equal Pulmonary: normal non-labored breathing , without Rales, rhonchi,  Wheezing, trach present Cardiac:irregular rate noted on EKG reading No carotid bruits Abdomen: soft, NT, no masses Skin: no rashes, ulcers noted Noted pitting edema   ASSESSMENT:  Acute renal transplant failure creat 1.58 INR 1.23, K 4.0 PLAN:Diatek catheter placement   COLLINS, EMMA MAUREEN  Addendum  I have independently  interviewed and examined the patient, and I agree with the physician assistant's findings.  Pt no longer septic.  ID recommending catheter exchange as part of infectious work-up.  Currently he has a RIJ temporary dialysis catheter and a LIJ central line.  He currently is on the vent with trach collar.  Given pt is on steroids also, it will be impossible to determine with a WBC whether he is bacteremic.  He does not appear to be acutely ill at his point, so I don't think it is unreasonable to exchange the RIJ temporary for a tunnel dialysis catheter.  This will be scheduled for tomorrow, barring any objections.  Leonides Sake, MD [facility] [place] Office: [phone] Pager: [phone]  06/19/2011, 1:45 PM

## 2011-06-20 ENCOUNTER — Inpatient Hospital Stay (HOSPITAL_COMMUNITY): Payer: BC Managed Care – PPO

## 2011-06-20 ENCOUNTER — Inpatient Hospital Stay (HOSPITAL_COMMUNITY): Payer: BC Managed Care – PPO | Admitting: Anesthesiology

## 2011-06-20 ENCOUNTER — Encounter (HOSPITAL_COMMUNITY)
Admission: EM | Disposition: A | Discharge status: Rehabilitation Facility | Payer: Self-pay | Source: Ambulatory Visit | Attending: Internal Medicine

## 2011-06-20 ENCOUNTER — Encounter (HOSPITAL_COMMUNITY): Payer: Self-pay | Admitting: Anesthesiology

## 2011-06-20 DIAGNOSIS — D72829 Elevated white blood cell count, unspecified: Secondary | ICD-10-CM

## 2011-06-20 HISTORY — PX: INSERTION OF DIALYSIS CATHETER: SHX1324

## 2011-06-20 LAB — RENAL FUNCTION PANEL
Albumin: 2.1 g/dL — ABNORMAL LOW (ref 3.5–5.2)
BUN: 34 mg/dL — ABNORMAL HIGH (ref 6–23)
BUN: 34 mg/dL — ABNORMAL HIGH (ref 6–23)
CO2: 24 mEq/L (ref 19–32)
Calcium: 8.3 mg/dL — ABNORMAL LOW (ref 8.4–10.5)
Calcium: 8.4 mg/dL (ref 8.4–10.5)
Chloride: 101 mEq/L (ref 96–112)
Creatinine, Ser: 1.45 mg/dL — ABNORMAL HIGH (ref 0.50–1.35)
Creatinine, Ser: 1.57 mg/dL — ABNORMAL HIGH (ref 0.50–1.35)
GFR calc Af Amer: 56 mL/min — ABNORMAL LOW (ref >90)
GFR calc non Af Amer: 53 mL/min — ABNORMAL LOW (ref >90)
Glucose, Bld: 132 mg/dL — ABNORMAL HIGH (ref 70–99)
Phosphorus: 3 mg/dL (ref 2.3–4.6)
Sodium: 135 mEq/L (ref 135–145)

## 2011-06-20 LAB — CBC
MCH: 31.2 pg (ref 26.0–34.0)
MCHC: 29 g/dL — ABNORMAL LOW (ref 30.0–36.0)
MCV: 107.8 fL — ABNORMAL HIGH (ref 78.0–100.0)
Platelets: 95 10*3/uL — ABNORMAL LOW (ref 150–400)
RBC: 2.69 MIL/uL — ABNORMAL LOW (ref 4.22–5.81)

## 2011-06-20 LAB — MAGNESIUM: Magnesium: 2.4 mg/dL (ref 1.5–2.5)

## 2011-06-20 SURGERY — INSERTION OF DIALYSIS CATHETER
Anesthesia: General | Laterality: Right | Wound class: Clean

## 2011-06-20 MED ORDER — SODIUM CHLORIDE 0.9 % IV SOLN
INTRAVENOUS | Status: DC | PRN
  Administered 2011-06-20: 11:00:00 via INTRAVENOUS

## 2011-06-20 MED ORDER — MIDAZOLAM HCL 2 MG/2ML IJ SOLN: 1.0000 mg | INTRAMUSCULAR | Status: DC | PRN

## 2011-06-20 MED ORDER — HEPARIN SODIUM (PORCINE) 1000 UNIT/ML IJ SOLN
INTRAMUSCULAR | Status: AC
  Filled 2011-06-20: qty 1

## 2011-06-20 MED ORDER — FENTANYL CITRATE 0.05 MG/ML IJ SOLN: 50.0000 ug | INTRAMUSCULAR | Status: DC | PRN

## 2011-06-20 MED ORDER — LIDOCAINE-EPINEPHRINE (PF) 1 %-1:200000 IJ SOLN
INTRAMUSCULAR | Status: DC | PRN
  Administered 2011-06-20: 10 mL via INTRADERMAL

## 2011-06-20 MED ORDER — HEPARIN SODIUM (PORCINE) 5000 UNIT/ML IJ SOLN
INTRAMUSCULAR | Status: DC | PRN
  Administered 2011-06-20: 11:00:00

## 2011-06-20 MED ORDER — HYDROMORPHONE HCL PF 1 MG/ML IJ SOLN: 0.2500 mg | INTRAMUSCULAR | Status: DC | PRN

## 2011-06-20 MED ORDER — LORAZEPAM 2 MG/ML IJ SOLN: 1.0000 mg | Freq: Once | INTRAMUSCULAR | Status: AC | PRN

## 2011-06-20 MED ORDER — FENTANYL CITRATE 0.05 MG/ML IJ SOLN
INTRAMUSCULAR | Status: DC | PRN
  Administered 2011-06-20 (×3): 50 ug via INTRAVENOUS

## 2011-06-20 MED ORDER — AMIODARONE HCL IN DEXTROSE 360-4.14 MG/200ML-% IV SOLN
INTRAVENOUS | Status: DC | PRN
  Administered 2011-06-20: 30 mg/h via INTRAVENOUS

## 2011-06-20 MED ORDER — HEPARIN SODIUM (PORCINE) 1000 UNIT/ML IJ SOLN
INTRAMUSCULAR | Status: DC | PRN
  Administered 2011-06-20: 4.6 mL

## 2011-06-20 MED ORDER — 0.9 % SODIUM CHLORIDE (POUR BTL) OPTIME
TOPICAL | Status: DC | PRN
  Administered 2011-06-20: 1000 mL

## 2011-06-20 MED ORDER — FENTANYL CITRATE 0.05 MG/ML IJ SOLN
50.0000 ug | INTRAMUSCULAR | Status: DC | PRN
  Administered 2011-06-21: 50 ug via INTRAVENOUS
  Filled 2011-06-20: qty 2

## 2011-06-20 MED ORDER — HEPARIN SODIUM (PORCINE) 1000 UNIT/ML IJ SOLN
INTRAMUSCULAR | Status: AC
  Filled 2011-06-20: qty 3

## 2011-06-20 MED ORDER — POTASSIUM CHLORIDE 20 MEQ/15ML (10%) PO LIQD
ORAL | Status: AC
  Administered 2011-06-20: 40 meq via ORAL
  Filled 2011-06-20: qty 30

## 2011-06-20 MED ORDER — ONDANSETRON HCL 4 MG/2ML IJ SOLN
INTRAMUSCULAR | Status: DC | PRN
  Administered 2011-06-20: 4 mg via INTRAVENOUS

## 2011-06-20 MED ORDER — LIDOCAINE HCL (CARDIAC) 20 MG/ML IV SOLN
INTRAVENOUS | Status: DC | PRN
  Administered 2011-06-20 (×2): 50 mg via INTRAVENOUS

## 2011-06-20 MED ORDER — MIDAZOLAM HCL 5 MG/5ML IJ SOLN
INTRAMUSCULAR | Status: DC | PRN
  Administered 2011-06-20 (×2): 1 mg via INTRAVENOUS

## 2011-06-20 SURGICAL SUPPLY — 44 items
ADH SKN CLS APL DERMABOND .7 (GAUZE/BANDAGES/DRESSINGS) ×1
BAG DECANTER FOR FLEXI CONT (MISCELLANEOUS) ×2 IMPLANT
CATH CANNON HEMO 15F 50CM (CATHETERS) IMPLANT
CATH CANNON HEMO 15FR 19 (HEMODIALYSIS SUPPLIES) IMPLANT
CATH CANNON HEMO 15FR 23CM (HEMODIALYSIS SUPPLIES) ×1 IMPLANT
CATH CANNON HEMO 15FR 31CM (HEMODIALYSIS SUPPLIES) IMPLANT
CATH CANNON HEMO 15FR 32 (HEMODIALYSIS SUPPLIES) IMPLANT
CATH CANNON HEMO 15FR 32CM (HEMODIALYSIS SUPPLIES) IMPLANT
CLOTH BEACON ORANGE TIMEOUT ST (SAFETY) ×2 IMPLANT
COVER PROBE W GEL 5X96 (DRAPES) ×2 IMPLANT
DERMABOND ADVANCED (GAUZE/BANDAGES/DRESSINGS) ×1
DERMABOND ADVANCED .7 DNX12 (GAUZE/BANDAGES/DRESSINGS) ×1 IMPLANT
DRAPE C-ARM 42X72 X-RAY (DRAPES) ×2 IMPLANT
DRAPE CHEST BREAST 15X10 FENES (DRAPES) ×2 IMPLANT
GAUZE SPONGE 2X2 8PLY STRL LF (GAUZE/BANDAGES/DRESSINGS) IMPLANT
GAUZE SPONGE 4X4 16PLY XRAY LF (GAUZE/BANDAGES/DRESSINGS) ×2 IMPLANT
GLOVE BIOGEL PI IND STRL 7.5 (GLOVE) ×1 IMPLANT
GLOVE BIOGEL PI INDICATOR 7.5 (GLOVE) ×1
GLOVE SURG SS PI 7.5 STRL IVOR (GLOVE) ×2 IMPLANT
GOWN PREVENTION PLUS XXLARGE (GOWN DISPOSABLE) ×2 IMPLANT
GOWN STRL NON-REIN LRG LVL3 (GOWN DISPOSABLE) ×2 IMPLANT
KIT BASIN OR (CUSTOM PROCEDURE TRAY) ×2 IMPLANT
KIT ROOM TURNOVER OR (KITS) ×2 IMPLANT
NDL 18GX1X1/2 (RX/OR ONLY) (NEEDLE) ×1 IMPLANT
NDL HYPO 25GX1X1/2 BEV (NEEDLE) ×1 IMPLANT
NEEDLE 18GX1X1/2 (RX/OR ONLY) (NEEDLE) ×2 IMPLANT
NEEDLE HYPO 25GX1X1/2 BEV (NEEDLE) ×2 IMPLANT
NS IRRIG 1000ML POUR BTL (IV SOLUTION) ×2 IMPLANT
PACK SURGICAL SETUP 50X90 (CUSTOM PROCEDURE TRAY) ×2 IMPLANT
PAD ARMBOARD 7.5X6 YLW CONV (MISCELLANEOUS) ×4 IMPLANT
SOAP 2 % CHG 4 OZ (WOUND CARE) ×2 IMPLANT
SPONGE GAUZE 2X2 STER 10/PKG (GAUZE/BANDAGES/DRESSINGS) ×1
SUT ETHILON 3 0 PS 1 (SUTURE) ×2 IMPLANT
SUT VICRYL 4-0 PS2 18IN ABS (SUTURE) ×2 IMPLANT
SYR 20CC LL (SYRINGE) ×4 IMPLANT
SYR 30ML LL (SYRINGE) IMPLANT
SYR 5ML LL (SYRINGE) ×2 IMPLANT
SYR CONTROL 10ML LL (SYRINGE) ×2 IMPLANT
SYRINGE 10CC LL (SYRINGE) ×2 IMPLANT
TAPE CLOTH SURG 4X10 WHT LF (GAUZE/BANDAGES/DRESSINGS) ×1 IMPLANT
TOWEL OR 17X24 6PK STRL BLUE (TOWEL DISPOSABLE) ×2 IMPLANT
TOWEL OR 17X26 10 PK STRL BLUE (TOWEL DISPOSABLE) ×2 IMPLANT
WATER STERILE IRR 1000ML POUR (IV SOLUTION) ×2 IMPLANT
WIRE BENTSON .035X145CM (WIRE) ×1 IMPLANT

## 2011-06-20 NOTE — Progress Notes (Signed)
This Clinical Social Worker reviewed case during board rounds, CSW will prepare paperwork in case pt requires SNF.  At this time, pt is not able to participate in PT/OT evaluations.  CSW to sign off at this time, please re consult once pt medically able to participate with dc planning.   Angelia Mould, MSW, [place] [phone]

## 2011-06-20 NOTE — Progress Notes (Signed)
INFECTIOUS DISEASE PROGRESS NOTE  ID: Chad [name] is a 56 y.o. male with  Principal Problem:  *Septic shock Active Problems:  HCAP (healthcare-associated pneumonia)  Thrombocytopenia  Acute respiratory failure  ARDS (adult respiratory distress syndrome)  Renal transplant disorder  Acute systolic heart failure  Acute renal failure  Cardiomyopathy  Subjective: Without complaint. States he "stays cold"  Abtx:  Anti-infectives     Start     Dose/Rate Route Frequency Ordered Stop   06/20/11 1000   vancomycin (VANCOCIN) 1,250 mg in sodium chloride 0.9 % 250 mL IVPB        1,250 mg 166.7 mL/hr over 90 Minutes Intravenous Every 24 hours 06/19/11 0957     06/19/11 1000   vancomycin (VANCOCIN) 1,750 mg in sodium chloride 0.9 % 500 mL IVPB        1,750 mg 250 mL/hr over 120 Minutes Intravenous NOW 06/19/11 0956 06/19/11 1434   06/13/11 1200   vancomycin (VANCOCIN) 50 mg/mL oral solution 500 mg  Status:  Discontinued        500 mg Per Tube 4 times per day 06/13/11 1045 06/13/11 1600   06/13/11 1200   metroNIDAZOLE (FLAGYL) IVPB 500 mg  Status:  Discontinued        500 mg 100 mL/hr over 60 Minutes Intravenous Every 8 hours 06/13/11 1045 06/13/11 1600   06/12/11 1000   Ribavirin SOLN 400 mg  Status:  Discontinued     Comments: Special order item.       400 mg Per Tube 3 times daily 06/11/11 1346 06/18/11 1553   06/11/11 1600   ribavirin (REBETOL) capsule 400 mg  Status:  Discontinued        400 mg Oral 3 times daily 06/11/11 1239 06/11/11 1343   06/08/11 2200   ceFEPIme (MAXIPIME) 2 g in dextrose 5 % 50 mL IVPB     Comments: Use Cefepime 1 g IV q12h for CrCl< 60 mL/min      2 g 100 mL/hr over 30 Minutes Intravenous Every 12 hours 06/08/11 1536 06/11/11 2320   06/08/11 0800   piperacillin-tazobactam (ZOSYN) IVPB 3.375 g  Status:  Discontinued        3.375 g 12.5 mL/hr over 240 Minutes Intravenous Every 8 hours 06/08/11 0149 06/08/11 1155   06/08/11 0600   micafungin  (MYCAMINE) 100 mg in sodium chloride 0.9 % 100 mL IVPB  Status:  Discontinued        100 mg 100 mL/hr over 1 Hours Intravenous Daily 06/08/11 0308 06/10/11 1703   06/08/11 0200  piperacillin-tazobactam (ZOSYN) IVPB 3.375 g       3.375 g 100 mL/hr over 30 Minutes Intravenous  Once 06/08/11 0149 06/08/11 0254   06/06/11 0600   vancomycin (VANCOCIN) 1,750 mg in sodium chloride 0.9 % 500 mL IVPB  Status:  Discontinued        1,750 mg 250 mL/hr over 120 Minutes Intravenous Every 24 hours 06/05/11 0906 06/08/11 0403   06/05/11 2200   levofloxacin (LEVAQUIN) IVPB 750 mg  Status:  Discontinued     Comments: Use Levaquin 750 mg IV q48h for CrCl < 60mL/min      750 mg 100 mL/hr over 90 Minutes Intravenous Every 48 hours 06/04/11 0344 06/05/11 0908   06/05/11 1000   levofloxacin (LEVAQUIN) IVPB 750 mg  Status:  Discontinued     Comments: Use Levaquin 750 mg IV q48h for CrCl < 28mL/min      750 mg 100 mL/hr over 90  Minutes Intravenous Every 24 hours 06/05/11 0908 06/06/11 0908   06/04/11 1200   sulfamethoxazole-trimethoprim (BACTRIM) 664 mg in dextrose 5 % 500 mL IVPB  Status:  Discontinued        20 mg/kg/day  132.7 kg 361 mL/hr over 90 Minutes Intravenous 4 times per day 06/04/11 1039 06/06/11 0908   06/04/11 1100   micafungin (MYCAMINE) 100 mg in sodium chloride 0.9 % 100 mL IVPB  Status:  Discontinued        100 mg 100 mL/hr over 1 Hours Intravenous Daily 06/04/11 1030 06/06/11 0908   06/04/11 0600   ceFEPIme (MAXIPIME) 1 g in dextrose 5 % 50 mL IVPB  Status:  Discontinued     Comments: Use Cefepime 1 g IV q12h for CrCl< 60 mL/min      1 g 100 mL/hr over 30 Minutes Intravenous Every 12 hours 06/04/11 0344 06/08/11 1536   06/04/11 0600   vancomycin (VANCOCIN) 1,500 mg in sodium chloride 0.9 % 500 mL IVPB  Status:  Discontinued        1,500 mg 250 mL/hr over 120 Minutes Intravenous Every 24 hours 06/04/11 0425 06/05/11 0906   06/04/11 0430   levofloxacin (LEVAQUIN) IVPB 750 mg         750 mg 100 mL/hr over 90 Minutes Intravenous  Once 06/04/11 0354 06/04/11 0632   06/03/11 2330   vancomycin (VANCOCIN) IVPB 1000 mg/200 mL premix        1,000 mg 200 mL/hr over 60 Minutes Intravenous  Once 06/03/11 2326 06/04/11 0051   06/03/11 2330  piperacillin-tazobactam (ZOSYN) IVPB 3.375 g       3.375 g 12.5 mL/hr over 240 Minutes Intravenous  Once 06/03/11 2326 06/04/11 0521          Medications:  Scheduled:   . antiseptic oral rinse  15 mL Mouth Rinse QID  . chlorhexidine  15 mL Mouth Rinse BID  . collagenase   Topical Daily  . darbepoetin (ARANESP) injection - DIALYSIS  100 mcg Intravenous Q Wed-HD  . feeding supplement (OXEPA)  1,000 mL Per Tube Q24H  . feeding supplement  60 mL Per Tube QID  . ferric gluconate (FERRLECIT/NULECIT) IV  125 mg Intravenous Daily  . heparin      . mulitivitamin  5 mL Per Tube Daily  . pantoprazole sodium  40 mg Per Tube BID  . potassium chloride  40 mEq Oral Daily  . predniSONE  10 mg Per Tube Q breakfast  . sirolimus  0.75 mg Oral Daily  . sodium phosphate  Dextrose 5% IVPB  30 mmol Intravenous Once  . vancomycin  1,250 mg Intravenous Q24H  . vancomycin  1,750 mg Intravenous NOW  . DISCONTD: hydrocortisone sod succinate (SOLU-CORTEF) injection  25 mg Intravenous Daily    Objective: Vital signs in last 24 hours: Temp:  [97.3 F (36.3 C)-98.9 F (37.2 C)] 97.3 F (36.3 C) (05/24 0800) Pulse Rate:  [62-141] 62  (05/24 0923) Resp:  [15-34] 22  (05/24 0923) BP: (100-124)/(61-93) 114/71 mmHg (05/24 0923) SpO2:  [94 %-100 %] 100 % (05/24 0923) FiO2 (%):  [40 %] 40 % (05/24 0923) Weight:  [109.3 kg (240 lb 15.4 oz)] 109.3 kg (240 lb 15.4 oz) (05/24 0500)   General appearance: alert, cooperative and no distress Neck: trach site dressed, L IJ clean, R HD line clean. Resp: rhonchi bilaterally Cardio: regular rate and rhythm GI: normal findings: bowel sounds normal and soft, minimal tenderness on LUQ. no r/g Extremities: massive LE  edema, the erythema in his LE is slightly better. feet are warm.   Lab Results  Basename 06/20/11 0500 06/19/11 1650 06/18/11 1545  WBC 23.5* -- 23.6*  HGB 8.4* -- 7.9*  HCT 29.0* -- 27.1*  NA 136 134* --  K 3.4* 4.2 --  CL 101 100 --  CO2 26 26 --  BUN 34* 37* --  CREATININE 1.45* 1.57* --  GLU -- -- --   Liver Panel  Basename 06/20/11 0500 06/19/11 1650  PROT -- --  ALBUMIN 2.1* 2.2*  AST -- --  ALT -- --  ALKPHOS -- --  BILITOT -- --  BILIDIR -- --  IBILI -- --   Sedimentation Rate No results found for this basename: ESRSEDRATE in the last 72 hours C-Reactive Protein No results found for this basename: CRP:2 in the last 72 hours  Microbiology: Recent Results (from the past 240 hour(s))  RESPIRATORY VIRUS PANEL (18 COMPONENTS)     Status: Abnormal   Collection Time   06/11/11  8:33 PM      Component Value Range Status Comment   Source - RVPAN BRONCHIAL ALVEOLAR LAVAGE   Final    Respiratory Syncytial Virus A NOT DETECTED   Final    Respiratory Syncytial Virus B NOT DETECTED   Final    Influenza A NOT DETECTED   Final    Influenza B NOT DETECTED   Final    Parainfluenza 1 NOT DETECTED   Final    Parainfluenza 2 NOT DETECTED   Final    Parainfluenza 3 DETECTED (*)  Final    Parainfluenza 4 NOT DETECTED   Final    Metapneumovirus NOT DETECTED   Final    Coxsackie and Echovirus NOT DETECTED   Final    Rhinovirus NOT DETECTED   Final    Adenovirus B NOT DETECTED   Final    Adenovirus E NOT DETECTED   Final    CoronavirusNL63 NOT DETECTED   Final    CoronavirusHKU1 NOT DETECTED   Final    Coronavirus229E NOT DETECTED   Final    CoronavirusOC43 NOT DETECTED   Final   CLOSTRIDIUM DIFFICILE BY PCR     Status: Normal   Collection Time   06/13/11  1:14 PM      Component Value Range Status Comment   C difficile by pcr NEGATIVE  NEGATIVE  Final   CLOSTRIDIUM DIFFICILE BY PCR     Status: Normal   Collection Time   06/19/11 10:25 AM      Component Value Range Status  Comment   C difficile by pcr NEGATIVE  NEGATIVE  Final   CULTURE, BLOOD (ROUTINE X 2)     Status: Normal (Preliminary result)   Collection Time   06/19/11 10:57 AM      Component Value Range Status Comment   Specimen Description BLOOD LEFT ARM   Final    Special Requests BOTTLES DRAWN AEROBIC AND ANAEROBIC 10CC   Final    Culture  Setup Time [id number]   Final    Culture     Final    Value:        BLOOD CULTURE RECEIVED NO GROWTH TO DATE CULTURE WILL BE HELD FOR 5 DAYS BEFORE ISSUING A FINAL NEGATIVE REPORT   Report Status PENDING   Incomplete   CULTURE, BLOOD (ROUTINE X 2)     Status: Normal (Preliminary result)   Collection Time   06/19/11 10:58 AM      Component Value  Range Status Comment   Specimen Description BLOOD LEFT HAND   Final    Special Requests BOTTLES DRAWN AEROBIC AND ANAEROBIC 10CC   Final    Culture  Setup Time [id number]   Final    Culture     Final    Value:        BLOOD CULTURE RECEIVED NO GROWTH TO DATE CULTURE WILL BE HELD FOR 5 DAYS BEFORE ISSUING A FINAL NEGATIVE REPORT   Report Status PENDING   Incomplete     Studies/Results: Dg Chest Port 1 View  06/20/2011  *RADIOLOGY REPORT*  Clinical Data: Cough.  Pneumonia.  PORTABLE CHEST - 1 VIEW  Comparison: 06/18/2011  Findings: 0529 hours.  Lung volumes are low. The cardiopericardial silhouette is enlarged.  Tracheostomy tube again noted. A feeding tube passes into the stomach although the distal tip position is not included on the film.  Right IJ central line tip projects at the distal SVC level. Telemetry leads overlie the chest.  IMPRESSION: Stable.  Cardiomegaly with low lung volumes and mild vascular congestion.  Original Report Authenticated By: ERIC A. MANSELL, M.D.   Dg Chest Port 1 View  06/18/2011  *RADIOLOGY REPORT*  Clinical Data: Left IJ line placement.  PORTABLE CHEST - 1 VIEW  Comparison: 06/18/2011 at 5:31 a.m.  Findings: Right internal jugular central venous catheter tip projects over the SVC.  The  left internal jugular central venous catheters present with tip projecting over the SVC.  Tracheostomy tube projects over the tracheal shadow.  Feeding tube extends down into the stomach.  Stable cardiomegaly noted with interstitial accentuation suggesting interstitial edema, slightly improved compared to the earlier exam. No pneumothorax observed.  IMPRESSION:  1.  Bilateral IJ lines, with both line tip projecting over the SVC. No pneumothorax. 2.  Cardiomegaly with slightly improved interstitial edema.  Original Report Authenticated By: Dellia Cloud, M.D.   Dg Abd Portable 1v  06/20/2011  *RADIOLOGY REPORT*  Clinical Data: The feeding tube placement  PORTABLE ABDOMEN - 1 VIEW  Comparison: 06/19/2011  Findings: Supine view of the abdomen shows the feeding tube coiled in the distal stomach before passing through the pylorus.  The distal tip is positioned in the third segment of the duodenum.  IMPRESSION: Feeding tube is coiled in the stomach before passing through the pylorus with the tip in the third segment the duodenum.  Original Report Authenticated By: ERIC A. MANSELL, M.D.   Dg Abd Portable 1v  06/19/2011  *RADIOLOGY REPORT*  Clinical Data: The tube placement  PORTABLE ABDOMEN - 1 VIEW  Comparison: 06/19/2011  Findings: Feeding tube coils within the stomach and continues inferiorly, with tip projecting over the expected location of the second portion of the duodenum.  Nonobstructive bowel gas pattern. A left hip arthroplasty is noted however incompletely evaluated.  IMPRESSION: Feeding tube tip projects over the expected location of the second portion of the duodenum.  Original Report Authenticated By: Waneta Martins, M.D.   Dg Abd Portable 1v  06/19/2011  *RADIOLOGY REPORT*  Clinical Data: Pain, increased white count, dialysis  PORTABLE ABDOMEN - 1 VIEW  Comparison: Jun 09, 2011  Findings: The feeding tube tip is likely at the duodenal/jejunal junction.  The stool and bowel gas pattern is  within normal limits with no evidence of obstruction.  No gross pneumoperitoneum.  Left hip prosthesis noted.  IMPRESSION: Normal stool and bowel gas pattern.  Original Report Authenticated By: Brandon Melnick, M.D.     Assessment/Plan: Renal Txp (sirolimus,  hydrocortisone)  On CRRT  Pneumonia (parainfluenza), viral CM?  Off ribavirin, IVIg,  Day 2 vanco Repeat BCx-  ngtd. His respiratory status and previous CXR do not suggest pna. Not making significant urine.  HD Line change today Cellulitis- LE dopplers (-) Abd pain- C diff (-) 5-17, 5-23. Flat plate (-)  Would consider CT abd/pelvis if WBC continues to increase or clinical change.  Broaden his anbx coverage if WBC continues to increase or clinical change.  Clinically he is significantly better.    Johny Sax Infectious Diseases [phone] 06/20/2011, 9:37 AM   LOS: 17 days

## 2011-06-20 NOTE — Progress Notes (Signed)
SLP Cancellation Note  Continued daily f/u to determine readiness for PMSV assessment. Pt still working toward TC tolerance; currently remains on vent. Plan to f/u 5/27. If patient is tolerating trach collar sooner, please re-consult.   Alfredo Spong Meryl 06/20/2011, 8:36 AM

## 2011-06-20 NOTE — Op Note (Signed)
Procedure: Ultrasound-guided insertion of Diatek catheter  Preoperative diagnosis: End-stage renal disease  Postoperative diagnosis: Same  Anesthesia: Local with IV sedation  Operative findings: 23 cm Diatek catheter right internal jugular vein  Operative details: After obtaining informed consent, the patient was taken to the operating room. The patient was placed in supine position on the operating room table. After adequate sedation the patient's entire neck and chest were prepped and draped in usual sterile fashion. The patient was placed in Trendelenburg position. Ultrasound was used to identify the patient's right internal jugular vein. This had normal compressibility and respiratory variation. Local anesthesia was infiltrated over the right jugular vein.  Using ultrasound guidance, the right internal jugular vein was successfully cannulated.  A 0.035 J-tipped guidewire was threaded into the right internal jugular vein and into the superior vena cava followed by the inferior vena cava under fluoroscopic guidance.   Next sequential 12 and 14 dilators were placed over the guidewire into the right atrium.  A 16 French dilator with a peel-away sheath was then placed over the guidewire into the right atrium.   The guidewire and dilator were removed. A 23 cm Diatek catheter was then placed through the peel away sheath into the right atrium.  The catheter was then tunneled subcutaneously, cut to length, and the hub attached. The catheter was noted to flush and draw easily. The catheter was inspected under fluoroscopy and found with its tip to be in the right atrium without any kinks throughout its course. I had to make an additional skin incision as the initial position of the catheter was too deep and so I had to pull it back  The catheter was sutured to the skin with nylon sutures. The neck insertion site was closed with Vicryl stitch. The catheter was then loaded with concentrated Heparin solution. A dry  sterile dressing was applied.  The patient tolerated procedure well and there were no complications. Instrument sponge and needle counts correct in the case. The patient was taken to the recovery room in stable condition. Chest x-ray will be obtained in the recovery room.  Jorge Ny, M.D. [facility] [place] Office: [phone] Pager: [phone]

## 2011-06-20 NOTE — Anesthesia Postprocedure Evaluation (Signed)
  Anesthesia Post-op Note  Patient: Chad [name]  Procedure(s) Performed: Procedure(s) (LRB): INSERTION OF DIALYSIS CATHETER (Right)  Patient Location: PACU and SICU  Anesthesia Type: MAC  Level of Consciousness: sedated  Airway and Oxygen Therapy: Patient remains intubated per anesthesia plan  Post-op Pain: none  Post-op Assessment: Post-op Vital signs reviewed, Patient's Cardiovascular Status Stable, Respiratory Function Stable, Patent Airway, No signs of Nausea or vomiting and Pain level controlled  Post-op Vital Signs: stable  Complications: No apparent anesthesia complications

## 2011-06-20 NOTE — Progress Notes (Signed)
ASSName: Chad [name] MRN: [id number] DOB: Feb 26, 1955    LOS: 17  PCCM  NOTE  History of Present Illness:  56 yo male admitted 06/03/2011 with cough, dyspnea and fever.  Progressed to [facility] 05/08 and PCCM consulted.  Has hx of renal transplant. PMHx Hep C, HTN, Gout  Cultures: BAL bronch 5/8>>>neg    AFB >> neg   Viral panel >> POS parainfluenza virus   Pneumocystis >> neg   Fungus >> neg   HSV >> neg BC 5/7>>>neg  Blood X 2 5/12 >> neg  Antibiotics: (per ID) 5/7 levoflox>>>5/10 5/7 bactrim >>>5/10 5/7 micafungin >> 5/14 5/7 vanc >> 5/10 5/10 cefepime >> 5/15 5/15 Ribaviran/IVIG >>Completed  Tests / Events: 5/8 hypoxic resp failure, intubated, near arrest 5/11 worsening shock , on sepsis protocol + dobut for low co-ox 5/15 Family mtg: continue full aggressive support but NCB if arrests. To reassess in 4-5 days. If no progress, consider withdrawal at that time 5/15 Trial of Ribaviran/IVIG 5/16 Markedly improved - reduced pressors, improved cognition, gas exchange improved 5/17 continued improvement. Tolerates PS 5 cm H2O with Vt > 800cc 5/17- extubated, re intubated 5/20- trach 5/24 Tunneled HD cath placed  Subjective: Tunneled HD cath inserted this AM.  Denies chest pain, abdominal pain.  Vital Signs: Temp:  [97.3 F (36.3 C)-98.9 F (37.2 C)] 97.3 F (36.3 C) (05/24 0800) Pulse Rate:  [62-141] 62  (05/24 0923) Resp:  [15-34] 22  (05/24 0923) BP: (100-124)/(61-93) 114/71 mmHg (05/24 0923) SpO2:  [94 %-100 %] 100 % (05/24 0923) FiO2 (%):  [40 %] 40 % (05/24 0923) Weight:  [240 lb 15.4 oz (109.3 kg)] 240 lb 15.4 oz (109.3 kg) (05/24 0500) I/O last 3 completed shifts: In: 3515.4 [I.V.:1227.4; NG/GT:1420; IV Piggyback:868] Out: 9321 [Urine:45; YQMVH:8469; Stool:1100]   Physical Examination: General - no distress HEENT - trach site clean, PANDA in place Cardiac - s1s2 regular, no murmur Chest - scattered rhonchi Abd - soft, non tender, +bowel sounds Ext -  2+edema Neuro - alert, follows commands, moves all extremities   Ventilator settings: Vent Mode:  [-] PRVC FiO2 (%):  [40 %] 40 % Set Rate:  [20 bmp] 20 bmp Vt Set:  [600 mL] 600 mL PEEP:  [5 cmH20] 5 cmH20 Pressure Support:  [8 cmH20] 8 cmH20 Plateau Pressure:  [10 cmH20-14 cmH20] 14 cmH20   CBC    Component Value Date/Time   WBC 23.5* 06/20/2011 0500   RBC 2.69* 06/20/2011 0500   HGB 8.4* 06/20/2011 0500   HCT 29.0* 06/20/2011 0500   PLT 95* 06/20/2011 0500   MCV 107.8* 06/20/2011 0500   MCH 31.2 06/20/2011 0500   MCHC 29.0* 06/20/2011 0500   RDW 25.5* 06/20/2011 0500   LYMPHSABS 2.1 06/18/2011 1545   MONOABS 2.4* 06/18/2011 1545   EOSABS 0.0 06/18/2011 1545   BASOSABS 0.2* 06/18/2011 1545    BMET    Component Value Date/Time   NA 136 06/20/2011 0500   K 3.4* 06/20/2011 0500   CL 101 06/20/2011 0500   CO2 26 06/20/2011 0500   GLUCOSE 104* 06/20/2011 0500   BUN 34* 06/20/2011 0500   CREATININE 1.45* 06/20/2011 0500   CALCIUM 8.3* 06/20/2011 0500   GFRNONAA 53* 06/20/2011 0500   GFRAA 62* 06/20/2011 0500    Dg Chest Port 1 View  06/20/2011  *RADIOLOGY REPORT*  Clinical Data: Cough.  Pneumonia.  PORTABLE CHEST - 1 VIEW  Comparison: 06/18/2011  Findings: 0529 hours.  Lung volumes are low. The cardiopericardial silhouette  is enlarged.  Tracheostomy tube again noted. A feeding tube passes into the stomach although the distal tip position is not included on the film.  Right IJ central line tip projects at the distal SVC level. Telemetry leads overlie the chest.  IMPRESSION: Stable.  Cardiomegaly with low lung volumes and mild vascular congestion.  Original Report Authenticated By: ERIC A. MANSELL, M.D.   Dg Chest Port 1 View  06/18/2011  *RADIOLOGY REPORT*  Clinical Data: Left IJ line placement.  PORTABLE CHEST - 1 VIEW  Comparison: 06/18/2011 at 5:31 a.m.  Findings: Right internal jugular central venous catheter tip projects over the SVC.  The left internal jugular central venous catheters  present with tip projecting over the SVC.  Tracheostomy tube projects over the tracheal shadow.  Feeding tube extends down into the stomach.  Stable cardiomegaly noted with interstitial accentuation suggesting interstitial edema, slightly improved compared to the earlier exam. No pneumothorax observed.  IMPRESSION:  1.  Bilateral IJ lines, with both line tip projecting over the SVC. No pneumothorax. 2.  Cardiomegaly with slightly improved interstitial edema.  Original Report Authenticated By: Dellia Cloud, M.D.   Dg Abd Portable 1v  06/20/2011  *RADIOLOGY REPORT*  Clinical Data: The feeding tube placement  PORTABLE ABDOMEN - 1 VIEW  Comparison: 06/19/2011  Findings: Supine view of the abdomen shows the feeding tube coiled in the distal stomach before passing through the pylorus.  The distal tip is positioned in the third segment of the duodenum.  IMPRESSION: Feeding tube is coiled in the stomach before passing through the pylorus with the tip in the third segment the duodenum.  Original Report Authenticated By: ERIC A. MANSELL, M.D.   Dg Abd Portable 1v  06/19/2011  *RADIOLOGY REPORT*  Clinical Data: The tube placement  PORTABLE ABDOMEN - 1 VIEW  Comparison: 06/19/2011  Findings: Feeding tube coils within the stomach and continues inferiorly, with tip projecting over the expected location of the second portion of the duodenum.  Nonobstructive bowel gas pattern. A left hip arthroplasty is noted however incompletely evaluated.  IMPRESSION: Feeding tube tip projects over the expected location of the second portion of the duodenum.  Original Report Authenticated By: Waneta Martins, M.D.   Dg Abd Portable 1v  06/19/2011  *RADIOLOGY REPORT*  Clinical Data: Pain, increased white count, dialysis  PORTABLE ABDOMEN - 1 VIEW  Comparison: Jun 09, 2011  Findings: The feeding tube tip is likely at the duodenal/jejunal junction.  The stool and bowel gas pattern is within normal limits with no evidence of  obstruction.  No gross pneumoperitoneum.  Left hip prosthesis noted.  IMPRESSION: Normal stool and bowel gas pattern.  Original Report Authenticated By: Brandon Melnick, M.D.     Assessment and Plan:  PULMONARY  Bronchoscopy 05/08>>diffuse, thick secretions ETT 5/8 >>5/17>>>retubed 6 hrs>>>5/20 Trach 5/20 (df)>>>  Acute respiratory failure 2nd to pneumonia, ARDS.  Required trach 05/20. Plan: -f/u CXR intermittently -pressure support wean as tolerated   CARDIAC Lt IJ CVL 05/08>> Rt Radial aline 05/08>>out R IJ HD cath 5/12 >> 5/13 (inadvertent removal) R IJ HD cath 5/13 >>5/24 Tunneled HD cath 5/24>>   5/08 Doppler legs>>negative for DVT 5/12 Echo>>EF 15 to 20%, mild LVH, diffuse hypokinesis, mild MR, mod/severe LA dilation, mod RV systolic dysfx, PAS 52 mmHg 5/23 Doppler legs>>negative for DVT  Shock>>likely septic and cardiogenic.  Resolved.  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 Plan: -continue  amiodarone per cardiology   RENAL  Acute on chronic renal failure with hx of renal transplant.  CVVH started 5/12. Plan: -continue renal replacement per nephrology -vascular surgery consulted for tunneled catheter insertion -continue sirolimus, prednisone   ID  Blood 5/23>> C diff 5/23>>  Vancomycin 5/23>>  Parainfluenza pneumonia.  Given IVIG 5/15. Plan: -started ribavarin 5/16 per ID>>completed 7 day course -other Abx completed 5/15  Cellulitis. -D2/x Vancomycin  Fever.  No recurrence since 5/22.   -If persists may need CT abd/pelvis, and broaden Abx   HEMATOLOGY  Anemia of critical illness, chronic disease, and renal failure. Plan: -f/u CBC -transfuse for Hb < 7 -continue nulecit, aranesp per renal  Leukocytosis, thrombocytopenia. Plan: -f/u CBC   ENDOCRINE  Steroid induced hyperglycemia. Plan: -monitor blood sugar on  BMET   NEUROLOGY  Mild agitation.  Improved. Plan: -monitor mental status   GASTROENTEROLOGY  Nurtition. Plan: -continue tube feeds  Hx of Hepatitis C. Plan: -f/u LFT intermittently   BEST PRACTICE -BID protonix for SUP -SCD for DVT prophylaxis -Full code  Coralyn Helling, MD 06/20/2011, 11:46 AM Pager:  [phone]

## 2011-06-20 NOTE — Progress Notes (Signed)
[facility]  Daily Progress Note  Assessment/Planning: ARF, Kidney transplant, PNA   Tube feeds stopped   Looks stable for exchange of RIJ temp. Catheter for [facility] today  Subjective    No complaints  Objective Filed Vitals:   06/20/11 0405 06/20/11 0500 06/20/11 0600 06/20/11 0700  BP:  103/70 112/71 109/75  Pulse:  68 75 74  Temp: 97.7 F (36.5 C)     TempSrc: Oral     Resp:  21 21 20   Height:      Weight:  240 lb 15.4 oz (109.3 kg)    SpO2:  100% 100% 99%    Intake/Output Summary (Last 24 hours) at 06/20/11 0758 Last data filed at 06/20/11 0700  Gross per 24 hour  Intake 2714.97 ml  Output   6692 ml  Net -3977.03 ml    PULM  Faint rales, on Vent, trach collar CV  Irr, irr GI  soft, NTND, tube feeds going VASC  R temporary dialysis cath: CVVHD in process  Laboratory CBC    Component Value Date/Time   WBC 23.5* 06/20/2011 0500   HGB 8.4* 06/20/2011 0500   HCT 29.0* 06/20/2011 0500   PLT 95* 06/20/2011 0500    BMET    Component Value Date/Time   NA 136 06/20/2011 0500   K 3.4* 06/20/2011 0500   CL 101 06/20/2011 0500   CO2 26 06/20/2011 0500   GLUCOSE 104* 06/20/2011 0500   BUN 34* 06/20/2011 0500   CREATININE 1.45* 06/20/2011 0500   CALCIUM 8.3* 06/20/2011 0500   GFRNONAA 53* 06/20/2011 0500   GFRAA 62* 06/20/2011 0500    Leonides Sake, MD [facility] [place] Office: [phone] Pager: [phone]  06/20/2011, 7:58 AM

## 2011-06-20 NOTE — H&P (Signed)
[facility] [place] [facility]  CONSULT NOTE  06/19/2011  DOB: [id number]  MRN : [id number]  CC: Acute renal transplant failure  Referring Physician: Dr. Eliott Nine  History of Present Illness: A 56 year old gentleman with history of renal transplant who has been doing fine coming in with 3 days of cough, shortness of breath and fever. He has been on immunosuppressants for his renal transplant. Patient has not had any known sick contacts. He had associated nausea and vomiting but no diarrhea no constipation no bright red blood per rectum. He has bilateral lymphedema which has remained for the most part within range of what he normally has. He has some slight chest discomfort but not frank chest pain. He is having cough which is slightly productive of white sputum. In the ED patient was found to be severely hypoxic requiring nonrebreather bag. His chest x-ray showed findings consistent with a pneumonia.  Emergent bronch done, bloody secretions and pus throughout 06-04-2011. He was started on antibiotics for for empiric fungal and PCP treatment.  We are being consulted for dialysis access. Regarding renal transplant, continuing his home dose of sirolimus and stress dose steroids which were decreased 5/15. Baseline function was not great with creatinine of 2. Minimal UOP, may not recover. Still on steroids and sirolimus. Will need tunnelled catheter later this week if possible when CCM feels able  Past Medical History   Diagnosis  Date   .  Hypertension    .  H/O kidney transplant     Past Surgical History   Procedure  Date   .  Nephrectomy transplanted organ     ROS: [x]  Positive [ ]  Denies  Not obtained due to tracheostomy and ventilator.  General: [ ]  Weight loss, [ ]  Fever, [ ]  chills  Neurologic: [ ]  Dizziness, [ ]  Blackouts, [ ]  Seizure  [ ]  Stroke, [ ]  "Mini stroke", [ ]  Slurred speech, [ ]  Temporary blindness; [ ]  weakness in arms or legs, [ ]  Hoarseness  Cardiac: [ ]  Chest pain/pressure,  [ ]  Shortness of breath at rest [ ]  Shortness of breath with exertion, [ ]  Atrial fibrillation or irregular heartbeat  Vascular: [ ]  Pain in legs with walking, [ ]  Pain in legs at rest, [ ]  Pain in legs at night,  [ ]  Non-healing ulcer, [ ]  Blood clot in vein/DVT,  Pulmonary: [ ]  Home oxygen, [ ]  Productive cough, [ ]  Coughing up blood, [ ]  Asthma,  [ ]  Wheezing  Musculoskeletal: [ ]  Arthritis, [ ]  Low back pain, [ ]  Joint pain  Hematologic: [ ]  Easy Bruising, [ ]  Anemia; [ ]  Hepatitis  Gastrointestinal: [ ]  Blood in stool, [ ]  Gastroesophageal Reflux/heartburn, [ ]  Trouble swallowing  Urinary: [ ]  chronic Kidney disease, [ ]  on HD - [ ]  MWF or [ ]  TTHS, [ ]  Burning with urination, [ ]  Difficulty urinating  Skin: [ ]  Rashes, [ ]  Wounds  Psychological: [ ]  Anxiety, [ ]  Depression  Social History  History   Substance Use Topics   .  Smoking status:  Never Smoker   .  Smokeless tobacco:  Not on file   .  Alcohol Use:  No    Family History  History reviewed. No pertinent family history.  No Known Allergies  Current Facility-Administered Medications   Medication  Dose  Route  Frequency  Provider  Last Rate  Last Dose   .  0.9 % sodium chloride infusion   Intravenous  Continuous  [name], MD  10 mL/hr at 06/16/11 [phone]    .  albuterol (PROVENTIL) (5 MG/ML) 0.5% nebulizer solution 2.5 mg  2.5 mg  Nebulization  Q3H PRN  Kalman Shan, MD   2.5 mg at 06/06/11 0803   .  amiodarone (NEXTERONE PREMIX) 360 mg/200 mL dextrose IV infusion  0.5 mg/min  Intravenous  Continuous  Dolores Patty, MD  16.7 mL/hr at 06/19/11 0807  0.5 mg/min at 06/19/11 0807   .  antiseptic oral rinse (BIOTENE) solution 15 mL  15 mL  Mouth Rinse  QID  [name], MD   15 mL at 06/19/11 0400   .  chlorhexidine (PERIDEX) 0.12 % solution 15 mL  15 mL  Mouth Rinse  BID  [name], MD   15 mL at 06/19/11 0740   .  collagenase (SANTYL) ointment   Topical  Daily  [name], MD     .   darbepoetin [facility]) injection 100 mcg  100 mcg  Intravenous  Q Wed-HD  Cecille Aver, MD   100 mcg at 06/18/11 1514   .  feeding supplement (OXEPA) liquid 1,000 mL  1,000 mL  Per Tube  Q24H  Merwyn Katos, MD   1,000 mL at 06/18/11 1820   .  feeding supplement (PRO-STAT SUGAR FREE 64) liquid 60 mL  60 mL  Per Tube  QID  Hettie Holstein, RD   60 mL at 06/18/11 2201   .  fentaNYL (SUBLIMAZE) injection 25 mcg  25 mcg  Intravenous  Q1H PRN  [name], MD   25 mcg at 06/18/11 2342   .  ferric gluconate (NULECIT) 125 mg in sodium chloride 0.9 % 100 mL IVPB  125 mg  Intravenous  Daily  Sadie Haber, MD   125 mg at 06/18/11 1219   .  hydrocortisone sodium succinate (SOLU-CORTEF) 100 mg/2 mL injection 25 mg  25 mg  Intravenous  Daily  [name], MD   25 mg at 06/18/11 0912   .  metoprolol (LOPRESSOR) injection 2.5-5 mg  2.5-5 mg  Intravenous  Q3H PRN  Merwyn Katos, MD   2.5 mg at 06/16/11 0028   .  mulitivitamin liquid 5 mL  5 mL  Per Tube  Daily  Hettie Holstein, RD   5 mL at 06/18/11 0912   .  pantoprazole sodium (PROTONIX) 40 mg/20 mL oral suspension 40 mg  40 mg  Per Tube  BID  Merwyn Katos, MD   40 mg at 06/18/11 2300   .  potassium chloride 20 MEQ/15ML (10%) liquid 40 mEq  40 mEq  Oral  Daily  Cecille Aver, MD   40 mEq at 06/18/11 0912   .  prismasol BGK 4/2.5 5,000 mL dialysis replacement fluid   CRRT  Continuous  Sadie Haber, MD  200 mL/hr at 06/19/11 [phone]    .  prismasol BGK 4/2.5 5,000 mL dialysis replacement fluid   CRRT  Continuous  Sadie Haber, MD  500 mL/hr at 06/19/11 0300    .  prismasol BGK 4/2.5 5,000 mL dialysis solution   CRRT  Continuous  Garnetta Buddy, MD  2,000 mL/hr at 06/19/11 [phone]    .  sirolimus (RAPAMUNE) 1 MG/ML solution 0.75 mg  0.75 mg  Oral  Daily  [name], MD   0.75 mg at 06/18/11 1101   .  sodium phosphate 30 mmol in dextrose  5 % 250 mL infusion  30 mmol  Intravenous  Once  Zigmund Gottron, MD   30 mmol at 06/18/11 0748   .  sodium phosphate 30 mmol in dextrose 5 % 250 mL infusion  30 mmol  Intravenous  Once  Sadie Haber, MD     .  vancomycin (VANCOCIN) 1,250 mg in sodium chloride 0.9 % 250 mL IVPB  1,250 mg  Intravenous  Q24H  Meera K Patel, PHARMD     .  vancomycin (VANCOCIN) 1,750 mg in sodium chloride 0.9 % 500 mL IVPB  1,750 mg  Intravenous  NOW  Meera Concha Se, PHARMD     .  DISCONTD: Immune Globulin (Human) SOLN 70 g  0.5 g/kg  Intravenous  QODAY  Gardiner Barefoot, MD   70 g at 06/17/11 1042   .  DISCONTD: insulin aspart (novoLOG) injection 0-4 Units  0-4 Units  Subcutaneous  Q4H  Kalman Shan, MD   1 Units at 06/17/11 0359   .  DISCONTD: Ribavirin SOLN 400 mg  400 mg  Per Tube  TID  [name], MD   400 mg at 06/18/11 1515    Significant Diagnostic Studies:  CBC  Lab Results   Component  Value  Date    WBC  23.6*  06/18/2011    HGB  7.9*  06/18/2011    HCT  27.1*  06/18/2011    MCV  105.0*  06/18/2011    PLT  99*  06/18/2011    BMET    Component  Value  Date/Time    NA  135  06/19/2011 0435    K  4.0  06/19/2011 0435    CL  99  06/19/2011 0435    CO2  26  06/19/2011 0435    GLUCOSE  96  06/19/2011 0435    BUN  39*  06/19/2011 0435    CREATININE  1.58*  06/19/2011 0435    CALCIUM  8.3*  06/19/2011 0435    GFRNONAA  48*  06/19/2011 0435    GFRAA  56*  06/19/2011 0435    COAG  Lab Results   Component  Value  Date    INR  1.16  06/15/2011    INR  1.27  06/08/2011    INR  1.23  04/07/2009    No results found for this basename: PTT    Physical Examination  Patient is alert on tracheostomy ventilator.  Follows commands  Pulse Readings from Last 3 Encounters:   06/19/11  108    General: WDWN in NAD  HENT: WNL  Eyes: Pupils equal  Pulmonary: normal non-labored breathing , without Rales, rhonchi, Wheezing, trach present  Cardiac:irregular rate noted on EKG reading No carotid bruits  Abdomen: soft, NT, no masses  Skin: no rashes, ulcers noted    Noted pitting edema  ASSESSMENT: Acute renal transplant failure creat 1.58  INR 1.23, K 4.0  PLAN:Diatek catheter placement  COLLINS, EMMA MAUREEN  Addendum  I have independently interviewed and examined the patient, and I agree with the physician assistant's findings. Pt no longer septic. ID recommending catheter exchange as part of infectious work-up. Currently he has a RIJ temporary dialysis catheter and a LIJ central line. He currently is on the vent with trach collar. Given pt is on steroids also, it will be impossible to determine with a WBC whether he is bacteremic. He does not appear to be acutely ill at his point, so I don't think  it is unreasonable to exchange the RIJ temporary for a tunnel dialysis catheter. This will be scheduled for tomorrow, barring any objections.   I spoke with the patient and friend at the bedside.  Plan for catheter exchange   Durene Cal

## 2011-06-20 NOTE — Anesthesia Preprocedure Evaluation (Signed)
Anesthesia Evaluation  Patient identified by MRN, date of birth, ID band Patient awake    Reviewed: Allergy & Precautions, H&P , NPO status , Patient's Chart, lab work & pertinent test results  Airway       Dental   Pulmonary COPD resp failure on vent trach + rhonchi   + wheezing  rales    Cardiovascular hypertension, Rhythm:Regular Rate:Tachycardia     Neuro/Psych    GI/Hepatic   Endo/Other  Diabetes mellitus-  Renal/GU      Musculoskeletal   Abdominal   Peds  Hematology   Anesthesia Other Findings trach  Reproductive/Obstetrics                           Anesthesia Physical Anesthesia Plan  ASA: IV  Anesthesia Plan: MAC   Post-op Pain Management:    Induction: Intravenous  Airway Management Planned: Tracheostomy  Additional Equipment:   Intra-op Plan:   Post-operative Plan: Post-operative intubation/ventilation  Informed Consent: I have reviewed the patients History and Physical, chart, labs and discussed the procedure including the risks, benefits and alternatives for the proposed anesthesia with the patient or authorized representative who has indicated his/her understanding and acceptance.     Plan Discussed with: CRNA and Surgeon  Anesthesia Plan Comments:         Anesthesia Quick Evaluation

## 2011-06-20 NOTE — Transfer of Care (Signed)
Immediate Anesthesia Transfer of Care Note  Patient: Chad [name]  Procedure(s) Performed: Procedure(s) (LRB): INSERTION OF DIALYSIS CATHETER (Right)  Patient Location: ICU  Anesthesia Type: MAC  Level of Consciousness: oriented, sedated, patient cooperative and responds to stimulation  Airway & Oxygen Therapy: Patient placed on Ventilator (see vital sign flow sheet for setting) and Pt placed on ventilator via pre-existing tracheostomy.  Post-op Assessment: Report given to MICU RN and Post -op Vital signs reviewed and stable  Post vital signs: Reviewed and stable  Complications: No apparent anesthesia complications

## 2011-06-20 NOTE — Anesthesia Procedure Notes (Signed)
Procedure Name: MAC Date/Time: 06/20/2011 10:35 AM Performed by: Wray Kearns A Pre-anesthesia Checklist: Patient identified, Timeout performed, Emergency Drugs available, Suction available and Patient being monitored Patient Re-evaluated:Patient Re-evaluated prior to inductionOxygen Delivery Method: Circle system utilized Preoxygenation: Pre-oxygenation with 100% oxygen Intubation Type: Tracheostomy and Combination inhalational/ intravenous induction Placement Confirmation: positive ETCO2 and breath sounds checked- equal and bilateral Dental Injury: Teeth and Oropharynx as per pre-operative assessment

## 2011-06-20 NOTE — Consult Note (Signed)
Patient: Chad [name] Date of Encounter: 06/20/2011, 10:00 AM Admit date: 06/03/2011     Subjective  Feels "better." Being prepped to go to OR with vascular. No CP or SOB.   Objective   Telemetry: appeared initially to be NSR rates 60s-70's. After vascular came to prep/move patient, he popped back into different rhythm that appears to be what was his flutter before (upper 90s-low 100s) Physical Exam: Filed Vitals:   06/20/11 0923  BP: 114/71  Pulse: 62  Temp: 97.3  Resp: 22  100% trach General: Well developed but chronically ill [name] in no acute distress. Head: Normocephalic, atraumatic, sclera non-icteric, no xanthomas, nares are without discharge.  Neck: Supple. No obvious masses. Was not able to assess JVD given trach. Lungs: Clear bilaterally anteriorly to auscultation without wheezes, rales, or rhonchi. Breathing is unlabored. Heart: Regular rhythm but tachy, S1 S2 without murmurs, rubs, or gallops.  Abdomen: Soft, non-tender, non-distended with normoactive bowel sounds. No hepatomegaly. No rebound/guarding. No obvious abdominal masses. Msk:  Strength and tone appear normal for age. Extremities: No clubbing or cyanosis. 3+ edema bilaterally.  Distal pedal pulses are diminished bilaterally but LE are warm. Neuro: Alert and oriented X 3. Moves all extremities spontaneously. Psych:  Responds to questions appropriately with a normal affect.    Intake/Output Summary (Last 24 hours) at 06/20/11 1000 Last data filed at 06/20/11 0900  Gross per 24 hour  Intake 2678.27 ml  Output   6647 ml  Net -3968.73 ml    Inpatient Medications:    . antiseptic oral rinse  15 mL Mouth Rinse QID  . chlorhexidine  15 mL Mouth Rinse BID  . collagenase   Topical Daily  . darbepoetin (ARANESP) injection - DIALYSIS  100 mcg Intravenous Q Wed-HD  . feeding supplement (OXEPA)  1,000 mL Per Tube Q24H  . feeding supplement  60 mL Per Tube QID  . ferric gluconate (FERRLECIT/NULECIT) IV  125 mg  Intravenous Daily  . heparin      . mulitivitamin  5 mL Per Tube Daily  . pantoprazole sodium  40 mg Per Tube BID  . potassium chloride  40 mEq Oral Daily  . predniSONE  10 mg Per Tube Q breakfast  . sirolimus  0.75 mg Oral Daily  . sodium phosphate  Dextrose 5% IVPB  30 mmol Intravenous Once  . vancomycin  1,250 mg Intravenous Q24H  . vancomycin  1,750 mg Intravenous NOW  . DISCONTD: hydrocortisone sod succinate (SOLU-CORTEF) injection  25 mg Intravenous Daily    Labs:  Basename 06/20/11 0500 06/19/11 1650 06/19/11 0435  NA 136 134* --  K 3.4* 4.2 --  CL 101 100 --  CO2 26 26 --  GLUCOSE 104* 118* --  BUN 34* 37* --  CREATININE 1.45* 1.57* --  CALCIUM 8.3* 8.4 --  MG 2.4 -- 2.2  PHOS 3.0 3.1 --    Basename 06/20/11 0500 06/19/11 1650  AST -- --  ALT -- --  ALKPHOS -- --  BILITOT -- --  PROT -- --  ALBUMIN 2.1* 2.2*   Basename 06/20/11 0500 06/18/11 1545 06/17/11 1349  WBC 23.5* 23.6* --  NEUTROABS -- 18.9* 13.2*  HGB 8.4* 7.9* --  HCT 29.0* 27.1* --  MCV 107.8* 105.0* --  PLT 95* 99* --   Radiology/Studies:  Abd Port 1V 06/20/11 - IMPRESSION:Feeding tube is coiled in the stomach before passing through the pylorus with the tip in the third segment the duodenum.  CXR 06/20/11 -  IMPRESSION: Stable. Cardiomegaly with low lung volumes and mild vascular congestion.   Assessment and Plan  56 y/o M with hx renal transplantation on immunosuppressives/no prior known cardiac hx admitted with PNA deemed parainfluenza, acute renal failure, septic shock requiring pressors, LE cellulitis. Tx with abx, ribavirin, antifungal. Intubated early on in hospitalization for resp failure, extubated then reintubated 5/17. Got trach 5/20. Has been on CVVH with plans for [facility] today. Cardiac issues include atrial flutter on admission, mildly positive enzymes with troponin up to 0.93, and EF of 15-20% on 06/08/11 echo.  1. ID: Parainfluenza+ pneumonia. W/ LE cellulitis, now on vancomycin.  Infectious disease is on board. WBC rising today. Per their rec, there is consideration for CT abd & pelvis/broadened abx coverage if WBC continues to rise. Moderate left shift on smear 5/22. 2. VDRF - on trach. 3. Septic shock - remains off pressors. 5. Acute systolic CHF - unclear cause of new onset cardiomyopathy, ?viral etiology. Will need reassessment of EF at some point. Volume being managed by CVVH. Consider addition of low-dose BB as pressure allows (i.e. 12.5mg  q6-q12 of metoprolol). No ACEI due to renal insufficiency. 6. Minimally elevated enzymes - likely secondary to tachycardia and shock rather than primary cardiac event. However with cardiomyopathy, would consider ischemic eval upon recovery. See above re: BB. 6. Atrial flutter - appeared to be in NSR briefly this AM. Tried to obtain 12-lead but after patient was prepped for vascular procedure he popped back in same rhythm he's been in prior. Remains on IV amiodarone. Will d/w MD re: transition to oral amio. The question of anticoag has been raised, however he remains anemic/thrombocytopenic in the face of acute illness. Once this is felt stable, would consider heparin. Would not start Coumadin at present given plans for dialysis access procedures. Avoid newer agents given renal failure.  Signed, Chad Spies PA-C  Patient seen with PA, agree with above note.  He is back in NSR now on amiodarone gtt.  Hopefully, as amiodarone builds up and he clinically improves, he will stay out of flutter.  Low dose beta blocker would be reasonable if BP tolerates.  He has a cardiomyopathy that may be either sepsis-related or a myocarditis related to his viral infection.  The hope is that this will improve over time as he recovers.    Chad [name] 06/20/2011 1:07 PM

## 2011-06-20 NOTE — Progress Notes (Signed)
Subjective: Aware he is  For permcath today Getting CRRT minus 150 hour/all 4K solutions/no anticoagulation Weight down by report 6 kg (??? Overall has decreased from 142 to 109 since 5/16  Objective Vital signs in last 24 hours: Filed Vitals:   06/20/11 0500 06/20/11 0600 06/20/11 0700 06/20/11 0800  BP: 103/70 112/71 109/75 112/74  Pulse: 68 75 74 70  Temp:    97.3 F (36.3 C)  TempSrc:    Oral  Resp: 21 21 20 22   Height:      Weight: 109.3 kg (240 lb 15.4 oz)     SpO2: 100% 100% 99% 100%   Weight change: -5.4 kg (-11 lb 14.5 oz)  Intake/Output Summary (Last 24 hours) at 06/20/11 0904 Last data filed at 06/20/11 0800  Gross per 24 hour  Intake 2708.27 ml  Output   6480 ml  Net -3771.73 ml   Physical Exam:  Blood pressure 112/74, pulse 70, temperature 97.3 F (36.3 C), temperature source Oral, resp. rate 22, height 5\' 11"  (1.803 m), weight 109.3 kg (240 lb 15.4 oz), SpO2 100.00%.  06/20/11 0500 109.3 kg (240 lb 15.4 oz) -- -- [facility]  06/19/11 0500 114.7 kg (252 lb 13.9 oz) -- -- [facility]  06/18/11 0500 120.2 kg (264 lb 15.9 oz) -- -- [facility]  06/17/11 0500 124.4 kg (274 lb 4 oz) -- -- [facility]  06/16/11 0600 127.3 kg (280 lb 10.3 oz) -- -- [facility]  06/15/11 0400 130.3 kg (287 lb 4.2 oz) -- -- [facility]  06/14/11 0400 ! 136.4 kg (300 lb 11.3 oz) -- -- [facility]  06/13/11 0500 ! 139.3 kg (307 lb 1.6 oz) -- -- [facility]  06/12/11 0500 ! 142.2 kg (313 lb 7  Awake and alert.  Writing messages.  Still indicates some perumbilical mild discomfort Lots of secretions in ETT Coarse BS No pericardial rub  Ht sds somewhat distant Abdomen without focal tenderness; allograft not tender Exts with improved edema; still 1-2+ pretib/3+ pedal Right ij tem HD cath Trach site not bleeding Labs: Basic Metabolic Panel:  Lab 06/20/11 4696 06/19/11 1650 06/19/11 0435 06/18/11 1545 06/18/11 0522 06/17/11 1349 06/17/11 0425  NA 136 134* 135 135 134* 134* 140  K 3.4* 4.2 4.0 4.0 4.1 4.2 3.7  CL 101 100 99 99 98 96 97  CO2 26 26 26 26  27 28  35*  GLUCOSE 104* 118* 96 112* 103* 119* 117*  BUN 34* 37* 39* 39* 38* 32* 33*  CREATININE 1.45* 1.57* 1.58* 1.65* 1.58* 1.50* 1.46*  ALB -- -- -- -- -- -- --  CALCIUM 8.3* 8.4 8.3* 8.1* 8.3* 8.5 9.2  PHOS 3.0 3.1 2.1* 3.7 2.1* 2.7 2.0*   Liver Function Tests:  Lab 06/20/11 0500 06/19/11 1650 06/19/11 0435 06/17/11 0425  AST -- -- -- 126*  ALT -- -- -- 52  ALKPHOS -- -- -- 93  BILITOT -- -- -- 2.0*  PROT -- -- -- 7.8  ALBUMIN 2.1* 2.2* 2.1* --   No results found for this basename: LIPASE:3,AMYLASE:3 in the last 168 hours No results found for this basename: AMMONIA:3 in the last 168 hours CBC:  Lab 06/20/11 0500 06/18/11 1545 06/17/11 1349 06/17/11 0425 06/15/11 0514  WBC 23.5* 23.6* 15.6* 17.0* --  NEUTROABS -- 18.9* 13.2* 13.4* 16.2*  HGB 8.4* 7.9* 8.1* 7.9* --  HCT 29.0* 27.1* 28.9* 27.5* --  MCV 107.8* 105.0* 106.3* 103.8* --  PLT 95* 99* 81* 69* --  Cardiac Enzymes: No results found for this basename: CKTOTAL:5,CKMB:5,CKMBINDEX:5,TROPONINI:5  in the last 168 hours CBG:  Lab 06/18/11 1120 06/18/11 0720 06/18/11 0347 06/17/11 2340 06/17/11 1941  GLUCAP 114* 100* 103* 113* 117*   Dg Chest Port 1 View  06/20/2011  *RADIOLOGY REPORT*  Clinical Data: Cough.  Pneumonia.  PORTABLE CHEST - 1 VIEW  Comparison: 06/18/2011  Findings: 0529 hours.  Lung volumes are low. The cardiopericardial silhouette is enlarged.  Tracheostomy tube again noted. A feeding tube passes into the stomach although the distal tip position is not included on the film.  Right IJ central line tip projects at the distal SVC level. Telemetry leads overlie the chest.  IMPRESSION: Stable.  Cardiomegaly with low lung volumes and mild vascular congestion.  Original Report Authenticated By: ERIC A. MANSELL, M.D.   Medications: . sodium chloride 10 mL/hr at 06/16/11 0459  . amiodarone (NEXTERONE PREMIX) 360 mg/200 mL dextrose 0.5 mg/min (06/19/11 2117)  . dialysis replacement fluid (prismasate) 200 mL/hr at  06/19/11 0833  . dialysis replacement fluid (prismasate) 500 mL/hr at 06/20/11 0448  . dialysate (PRISMASATE) 2,000 mL/hr at 06/20/11 0411   . antiseptic oral rinse  15 mL Mouth Rinse QID  . chlorhexidine  15 mL Mouth Rinse BID  . collagenase   Topical Daily  . darbepoetin (ARANESP) injection - DIALYSIS  100 mcg Intravenous Q Wed-HD  . feeding supplement (OXEPA)  1,000 mL Per Tube Q24H  . feeding supplement  60 mL Per Tube QID  . ferric gluconate (FERRLECIT/NULECIT) IV  125 mg Intravenous Daily  . mulitivitamin  5 mL Per Tube Daily  . pantoprazole sodium  40 mg Per Tube BID  . potassium chloride  40 mEq Oral Daily  . predniSONE  10 mg Per Tube Q breakfast  . sirolimus  0.75 mg Oral Daily  . sodium phosphate  Dextrose 5% IVPB  30 mmol Intravenous Once (last dose 5/23)  . vancomycin  1,250 mg Intravenous Q24H  . vancomycin  1,750 mg Intravenous NOW  . DISCONTD: hydrocortisone sod succinate (SOLU-CORTEF) injection  25 mg Intravenous Daily    I  have reviewed scheduled and prn medications.  Assessment/Recs:   Pt is a 56 y.o. 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 PNA/parainfluenza/cardiomyopathy/hypotension- completed RX for paraflu. Has trach and requiring vent support. Slow imp in CXR BP stable but soft no pressors;  .  2. 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 Citrate protocol stopped d/t alkalosis and CO2 normal now. Doing OK without anticoagulation except for filter clotting issues. 4K dialysate and daily supplement. Labs stable except requiring almost daily phos replacement. BP much better but still quite a lot of volume on board by exam. Would like to keep CRRT going to facilitate further fluid removal since tolerating neg 150/hr very well and CXR improving slowly. For permcath today  Regarding  renal transplant, continuing his home dose of sirolimus and steroids which have been decreased.   Baseline function was not great with creatinine of 2. Minimal UOP, may not recover.   3. Anemia- hgb stable. Aranesp. Iron.   4. Secondary hyperparathyroidism- no treatment right now, rocaltrol stopped - not essential at this time and calcium up   5. HTN/volume very overloaded appearing still. Tolerating volume removal and does seem to have responded. Continue negative 150/hour   6. Hypophosphatemia- replete prn  7. PNA parainfluenza- finished course  8. Fever, rising WBC - ? Lines ? cellulitus LE; Cdiff negative ID following On vanco started 5/23; bc 5.23 neg to date     Camille Bal, MD [facility] [phone] pager 06/20/2011, 9:04 AM

## 2011-06-20 NOTE — Progress Notes (Signed)
I was called by RN stating that newly placed permcath was not functional or at least not usable at this time for CRRT due to inadequate flows.  I suspect this is secondary to edema at insertion site as is often the case with new PC's.  The temporary IJ line was not removed at the time of placement of the new one, so for tonight CRRT is to be resumed via the temporary line and we will attempt to use the new line again in the AM.  Camille Bal, MD [facility] [phone] Pager 06/20/2011, 7:35 PM

## 2011-06-21 DIAGNOSIS — N5089 Other specified disorders of the male genital organs: Secondary | ICD-10-CM

## 2011-06-21 DIAGNOSIS — J189 Pneumonia, unspecified organism: Secondary | ICD-10-CM

## 2011-06-21 LAB — CBC
HCT: 29.7 % — ABNORMAL LOW (ref 39.0–52.0)
Hemoglobin: 8.5 g/dL — ABNORMAL LOW (ref 13.0–17.0)
MCH: 31.3 pg (ref 26.0–34.0)
MCV: 109.2 fL — ABNORMAL HIGH (ref 78.0–100.0)
RBC: 2.72 MIL/uL — ABNORMAL LOW (ref 4.22–5.81)

## 2011-06-21 LAB — RENAL FUNCTION PANEL
CO2: 25 mEq/L (ref 19–32)
Chloride: 102 mEq/L (ref 96–112)
Creatinine, Ser: 1.45 mg/dL — ABNORMAL HIGH (ref 0.50–1.35)
GFR calc non Af Amer: 53 mL/min — ABNORMAL LOW (ref >90)

## 2011-06-21 MED ORDER — HEPARIN SODIUM (PORCINE) 1000 UNIT/ML IJ SOLN
INTRAMUSCULAR | Status: AC
  Administered 2011-06-21: 3000 [IU]
  Filled 2011-06-21: qty 3

## 2011-06-21 NOTE — Progress Notes (Signed)
ASSName: Chad [name] MRN: [id number] DOB: [birth year]    LOS: 18  PCCM  NOTE  History of Present Illness:  56 yo male admitted 06/03/2011 with cough, dyspnea and fever.  Progressed to VDRF 05/08 and PCCM consulted.  Has hx of renal transplant. PMHx Hep C, HTN, Gout  Cultures: BAL bronch 5/8>>>neg    AFB >> neg   Viral panel >> POS parainfluenza virus   Pneumocystis >> neg   Fungus >> neg   HSV >> neg BC 5/7>>>neg  Blood X 2 5/12 >> neg  Antibiotics: (per ID) 5/7 levoflox>>>5/10 5/7 bactrim >>>5/10 5/7 micafungin >> 5/14 5/7 vanc >> 5/10 5/10 cefepime >> 5/15 5/15 Ribaviran/IVIG >>Completed  Tests / Events: 5/8 hypoxic resp failure, intubated, near arrest 5/11 worsening shock , on sepsis protocol + dobut for low co-ox 5/15 Family mtg: continue full aggressive support but NCB if arrests. To reassess in 4-5 days. If no progress, consider withdrawal at that time 5/15 Trial of Ribaviran/IVIG 5/16 Markedly improved - reduced pressors, improved cognition, gas exchange improved 5/17 continued improvement. Tolerates PS 5 cm H2O with Vt > 800cc 5/17- extubated, re intubated 5/20- trach 5/24 Tunneled HD cath placed  Subjective: Doing better.  Tolerating pressure support.  Remains on CVVH.  Vital Signs: Temp:  [97.6 F (36.4 C)-98.5 F (36.9 C)] 98 F (36.7 C) (05/25 0810) Pulse Rate:  [60-111] 72  (05/25 0600) Resp:  [16-27] 19  (05/25 0600) BP: (77-124)/(38-83) 99/56 mmHg (05/25 0600) SpO2:  [99 %-100 %] 100 % (05/25 0600) FiO2 (%):  [40 %] 40 % (05/25 0403) Weight:  [236 lb 8.9 oz (107.3 kg)] 236 lb 8.9 oz (107.3 kg) (05/25 0500) I/O last 3 completed shifts: In: 2831.1 [I.V.:1451.1; NG/GT:1030; IV Piggyback:350] Out: 7979 [Urine:68; NWGNF:6213; Stool:1500]   Physical Examination: General - no distress HEENT - trach site clean, PANDA in place Cardiac - s1s2 regular, no murmur Chest - scattered rhonchi Abd - soft, non tender, +bowel sounds Ext - 2+edema Neuro -  alert, follows commands, moves all extremities   Ventilator settings: Vent Mode:  [-] PRVC FiO2 (%):  [40 %] 40 % Set Rate:  [20 bmp] 20 bmp Vt Set:  [600 mL] 600 mL PEEP:  [5 cmH20] 5 cmH20 Pressure Support:  [5 cmH20] 5 cmH20 Plateau Pressure:  [14 cmH20-16 cmH20] 16 cmH20   CBC    Component Value Date/Time   WBC 17.7* 06/21/2011 0430   RBC 2.72* 06/21/2011 0430   HGB 8.5* 06/21/2011 0430   HCT 29.7* 06/21/2011 0430   PLT 86* 06/21/2011 0430   MCV 109.2* 06/21/2011 0430   MCH 31.3 06/21/2011 0430   MCHC 28.6* 06/21/2011 0430   RDW 27.1* 06/21/2011 0430   LYMPHSABS 2.1 06/18/2011 1545   MONOABS 2.4* 06/18/2011 1545   EOSABS 0.0 06/18/2011 1545   BASOSABS 0.2* 06/18/2011 1545    BMET    Component Value Date/Time   NA 138 06/21/2011 0430   K 3.6 06/21/2011 0430   CL 102 06/21/2011 0430   CO2 25 06/21/2011 0430   GLUCOSE 92 06/21/2011 0430   BUN 34* 06/21/2011 0430   CREATININE 1.45* 06/21/2011 0430   CALCIUM 8.5 06/21/2011 0430   GFRNONAA 53* 06/21/2011 0430   GFRAA 62* 06/21/2011 0430    Dg Chest Port 1 View  06/20/2011  *RADIOLOGY REPORT*  Clinical Data: Cough.  Pneumonia.  PORTABLE CHEST - 1 VIEW  Comparison: 06/18/2011  Findings: 0529 hours.  Lung volumes are low. The cardiopericardial silhouette is enlarged.  Tracheostomy tube again noted. A feeding tube passes into the stomach although the distal tip position is not included on the film.  Right IJ central line tip projects at the distal SVC level. Telemetry leads overlie the chest.  IMPRESSION: Stable.  Cardiomegaly with low lung volumes and mild vascular congestion.  Original Report Authenticated By: ERIC A. MANSELL, M.D.   Dg Abd Portable 1v  06/20/2011  *RADIOLOGY REPORT*  Clinical Data: The feeding tube placement  PORTABLE ABDOMEN - 1 VIEW  Comparison: 06/19/2011  Findings: Supine view of the abdomen shows the feeding tube coiled in the distal stomach before passing through the pylorus.  The distal tip is positioned in the third  segment of the duodenum.  IMPRESSION: Feeding tube is coiled in the stomach before passing through the pylorus with the tip in the third segment the duodenum.  Original Report Authenticated By: ERIC A. MANSELL, M.D.   Dg Abd Portable 1v  06/19/2011  *RADIOLOGY REPORT*  Clinical Data: The tube placement  PORTABLE ABDOMEN - 1 VIEW  Comparison: 06/19/2011  Findings: Feeding tube coils within the stomach and continues inferiorly, with tip projecting over the expected location of the second portion of the duodenum.  Nonobstructive bowel gas pattern. A left hip arthroplasty is noted however incompletely evaluated.  IMPRESSION: Feeding tube tip projects over the expected location of the second portion of the duodenum.  Original Report Authenticated By: Waneta Martins, M.D.   Dg Abd Portable 1v  06/19/2011  *RADIOLOGY REPORT*  Clinical Data: Pain, increased white count, dialysis  PORTABLE ABDOMEN - 1 VIEW  Comparison: Jun 09, 2011  Findings: The feeding tube tip is likely at the duodenal/jejunal junction.  The stool and bowel gas pattern is within normal limits with no evidence of obstruction.  No gross pneumoperitoneum.  Left hip prosthesis noted.  IMPRESSION: Normal stool and bowel gas pattern.  Original Report Authenticated By: Brandon Melnick, M.D.   Dg Fluoro Guide Cv Line-no Report  06/20/2011  CLINICAL DATA: diatek   FLOURO GUIDE CV LINE  Fluoroscopy was utilized by the requesting physician.  No radiographic  interpretation.       Assessment and Plan:  PULMONARY  Bronchoscopy 05/08>>diffuse, thick secretions ETT 5/8 >>5/17>>>retubed 6 hrs>>>5/20 Trach 5/20 (df)>>>  Acute respiratory failure 2nd to pneumonia, ARDS.  Required trach 05/20. Plan: -f/u CXR intermittently -pressure support wean to trach collar as tolerated>>rest on vent overnight for now -speech therapy to assess for speech valve once off vent during day   CARDIAC Lt IJ CVL 05/08>> Rt Radial aline 05/08>>out R IJ HD cath  5/12 >> 5/13 (inadvertent removal) R IJ HD cath 5/13 >>5/24 Tunneled HD cath 5/24>>   5/08 Doppler legs>>negative for DVT 5/12 Echo>>EF 15 to 20%, mild LVH, diffuse hypokinesis, mild MR, mod/severe LA dilation, mod RV systolic dysfx, PAS 52 mmHg 5/23 Doppler legs>>negative for DVT  Shock>>likely septic and cardiogenic.  Resolved.  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 Plan: -continue amiodarone per cardiology   RENAL  Acute on chronic renal failure with hx of renal transplant.  CVVH started 5/12. Plan: -continue renal replacement per nephrology>>?if he can transition to intermittent HD soon -continue sirolimus, prednisone   ID  Blood 5/23>> C diff 5/23>>negative  Vancomycin 5/23>>  Parainfluenza pneumonia.  Given IVIG 5/15.  Ribavarin started 5/16, and completed 7 day course. Plan: -monitor clinically off therapy  Cellulitis. -D3/x Vancomycin  Fever.  No  recurrence since 5/22.   -If persists may need CT abd/pelvis, and broaden Abx   HEMATOLOGY  Anemia of critical illness, chronic disease, and renal failure. Plan: -f/u CBC -transfuse for Hb < 7 -continue nulecit, aranesp per renal  Leukocytosis>>improving.  Thrombocytopenia. Plan: -f/u CBC   ENDOCRINE  Steroid induced hyperglycemia. Plan: -monitor blood sugar on BMET   NEUROLOGY  Mild agitation.  Improved. Plan: -monitor mental status   GASTROENTEROLOGY  Nurtition. Plan: -continue tube feeds -if remains off vent, then can proceed with swallow eval  Hx of Hepatitis C. Plan: -f/u LFT intermittently   BEST PRACTICE -BID protonix for SUP -SCD for DVT prophylaxis -Full code -PT/OT when off CRRT  Coralyn Helling, MD 06/21/2011, 9:01 AM Pager:  [phone]

## 2011-06-21 NOTE — Progress Notes (Signed)
Right IJ Hemodialysis catheter dc'd per protocol per order at 2040. Pt tolerated lying flat during removal. Site unremarkable after pressure held to site.

## 2011-06-21 NOTE — Progress Notes (Signed)
Subjective: I/O negative 3.2kg yesterday net.   Objective Vital signs in last 24 hours: Filed Vitals:   06/21/11 0500 06/21/11 0600 06/21/11 0810 06/21/11 0917  BP: 108/64 99/56  117/80  Pulse: 111 72  109  Temp:   98 F (36.7 C)   TempSrc:      Resp: 20 19  29   Height:      Weight: 107.3 kg (236 lb 8.9 oz)     SpO2: 100% 100%  100%   Weight change: -2 kg (-4 lb 6.5 oz)  Intake/Output Summary (Last 24 hours) at 06/21/11 1204 Last data filed at 06/21/11 1000  Gross per 24 hour  Intake 1460.6 ml  Output   5201 ml  Net -3740.4 ml   Physical Exam:  Blood pressure 112/74, pulse 70, temperature 97.3 F (36.3 C), temperature source Oral, resp. rate 22, height 5\' 11"  (1.803 m), weight 109.3 kg (240 lb 15.4 oz), SpO2 100.00%.  06/20/11 0500 109.3 kg (240 lb 15.4 oz) -- -- [facility]  06/19/11 0500 114.7 kg (252 lb 13.9 oz) -- -- [facility]  06/18/11 0500 120.2 kg (264 lb 15.9 oz) -- -- [facility]  06/17/11 0500 124.4 kg (274 lb 4 oz) -- -- [facility]  06/16/11 0600 127.3 kg (280 lb 10.3 oz) -- -- [facility]  06/15/11 0400 130.3 kg (287 lb 4.2 oz) -- -- [facility]  06/14/11 0400 ! 136.4 kg (300 lb 11.3 oz) -- -- [facility]  06/13/11 0500 ! 139.3 kg (307 lb 1.6 oz) -- -- [facility]  06/12/11 0500 ! 142.2 kg (313 lb 7  Awake and alert.  Trach collar in place, no distress.  Coarse BS No pericardial rub  Ht sds somewhat distant Abdomen without focal tenderness; allograft not tender Exts with improved edema; still 1-2+ pretib/3+ pedal Right ij tem HD cath and R SCV TDC in place  Labs: Basic Metabolic Panel:  Lab 06/21/11 0981 06/20/11 1500 06/20/11 0500 06/19/11 1650 06/19/11 0435 06/18/11 1545 06/18/11 0522  NA 138 135 136 134* 135 135 134*  K 3.6 4.2 3.4* 4.2 4.0 4.0 4.1  CL 102 101 101 100 99 99 98  CO2 25 24 26 26 26 26 27   GLUCOSE 92 132* 104* 118* 96 112* 103*  BUN 34* 34* 34* 37* 39* 39* 38*  CREATININE 1.45* 1.57* 1.45* 1.57* 1.58* 1.65* 1.58*  ALB -- -- -- -- -- -- --  CALCIUM 8.5 8.4 8.3* 8.4 8.3* 8.1* 8.3*  PHOS 2.6 3.8 3.0  3.1 2.1* 3.7 2.1*   Liver Function Tests:  Lab 06/21/11 0430 06/20/11 1500 06/20/11 0500 06/17/11 0425  AST -- -- -- 126*  ALT -- -- -- 52  ALKPHOS -- -- -- 93  BILITOT -- -- -- 2.0*  PROT -- -- -- 7.8  ALBUMIN 2.1* 2.0* 2.1* --   No results found for this basename: LIPASE:3,AMYLASE:3 in the last 168 hours No results found for this basename: AMMONIA:3 in the last 168 hours CBC:  Lab 06/21/11 0430 06/20/11 0500 06/18/11 1545 06/17/11 1349 06/17/11 0425 06/15/11 0514  WBC 17.7* 23.5* 23.6* 15.6* -- --  NEUTROABS -- -- 18.9* 13.2* 13.4* 16.2*  HGB 8.5* 8.4* 7.9* 8.1* -- --  HCT 29.7* 29.0* 27.1* 28.9* -- --  MCV 109.2* 107.8* 105.0* 106.3* -- --  PLT 86* 95* 99* 81* -- --  Cardiac Enzymes: No results found for this basename: CKTOTAL:5,CKMB:5,CKMBINDEX:5,TROPONINI:5 in the last 168 hours CBG:  Lab 06/18/11 1120 06/18/11 0720 06/18/11 0347 06/17/11 2340 06/17/11 1941  GLUCAP 114*  100* 103* 113* 117*   Dg Chest Port 1 View  06/20/2011  *RADIOLOGY REPORT*  Clinical Data: Cough.  Pneumonia.  PORTABLE CHEST - 1 VIEW  Comparison: 06/18/2011  Findings: 0529 hours.  Lung volumes are low. The cardiopericardial silhouette is enlarged.  Tracheostomy tube again noted. A feeding tube passes into the stomach although the distal tip position is not included on the film.  Right IJ central line tip projects at the distal SVC level. Telemetry leads overlie the chest.  IMPRESSION: Stable.  Cardiomegaly with low lung volumes and mild vascular congestion.  Original Report Authenticated By: ERIC A. MANSELL, M.D.   Medications: . sodium chloride 10 mL/hr at 06/16/11 0459  . amiodarone (NEXTERONE PREMIX) 360 mg/200 mL dextrose 0.5 mg/min (06/19/11 2117)  . dialysis replacement fluid (prismasate) 200 mL/hr at 06/19/11 0833  . dialysis replacement fluid (prismasate) 500 mL/hr at 06/20/11 0448  . dialysate (PRISMASATE) 2,000 mL/hr at 06/20/11 0411   . antiseptic oral rinse  15 mL Mouth Rinse QID  .  chlorhexidine  15 mL Mouth Rinse BID  . collagenase   Topical Daily  . darbepoetin (ARANESP) injection - DIALYSIS  100 mcg Intravenous Q Wed-HD  . feeding supplement (OXEPA)  1,000 mL Per Tube Q24H  . feeding supplement  60 mL Per Tube QID  . ferric gluconate (FERRLECIT/NULECIT) IV  125 mg Intravenous Daily  . mulitivitamin  5 mL Per Tube Daily  . pantoprazole sodium  40 mg Per Tube BID  . potassium chloride  40 mEq Oral Daily  . predniSONE  10 mg Per Tube Q breakfast  . sirolimus  0.75 mg Oral Daily  . sodium phosphate  Dextrose 5% IVPB  30 mmol Intravenous Once (last dose 5/23)  . vancomycin  1,250 mg Intravenous Q24H  . vancomycin  1,750 mg Intravenous NOW  . DISCONTD: hydrocortisone sod succinate (SOLU-CORTEF) injection  25 mg Intravenous Daily    I  have reviewed scheduled and prn medications.  Assessment/Recs  Pt is a 56 y.o. 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 PNA/parainfluenza/cardiomyopathy/hypotension- completed RX for paraflu. Has trach and requiring vent support. Slow imp in CXR BP stable but soft no pressors 2. 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  Will continue CRRT today and consider stopping tomorrow. Has had excellent volume removal and has some LE edema left, but not a lot.  No pulm edema. We will retry tunneled HD catheter today also and remove temporary cath R IJ if possible. Continue -150 cc/hr UF.  Regarding renal transplant, continuing his home dose of sirolimus and steroids which have been decreased.   Baseline function was not great with creatinine of 2. Minimal UOP, may not recover. 3. Anemia- hgb stable. Aranesp. Iron. 4. Secondary hyperparathyroidism- no treatment right now, rocaltrol stopped - not essential at this time and calcium up 5. Hypophosphatemia- replete  prn 6. PNA parainfluenza- finished course 7. Fever, rising WBC - ? Lines ? cellulitus LE; Cdiff negative ID following On vanco started 5/23; bc 5.23 neg to date   Vinson Moselle  MD [facility] [phone] pgr    [phone] cell 06/21/2011, 12:11 PM

## 2011-06-21 NOTE — Progress Notes (Signed)
Subjective: Feels better   Antibiotics:  Anti-infectives     Start     Dose/Rate Route Frequency Ordered Stop   06/20/11 1000   vancomycin (VANCOCIN) 1,250 mg in sodium chloride 0.9 % 250 mL IVPB        1,250 mg 166.7 mL/hr over 90 Minutes Intravenous Every 24 hours 06/19/11 0957     06/19/11 1000   vancomycin (VANCOCIN) 1,750 mg in sodium chloride 0.9 % 500 mL IVPB        1,750 mg 250 mL/hr over 120 Minutes Intravenous NOW 06/19/11 0956 06/19/11 1434   06/13/11 1200   vancomycin (VANCOCIN) 50 mg/mL oral solution 500 mg  Status:  Discontinued        500 mg Per Tube 4 times per day 06/13/11 1045 06/13/11 1600   06/13/11 1200   metroNIDAZOLE (FLAGYL) IVPB 500 mg  Status:  Discontinued        500 mg 100 mL/hr over 60 Minutes Intravenous Every 8 hours 06/13/11 1045 06/13/11 1600   06/12/11 1000   Ribavirin SOLN 400 mg  Status:  Discontinued     Comments: Special order item.       400 mg Per Tube 3 times daily 06/11/11 1346 06/18/11 1553   06/11/11 1600   ribavirin (REBETOL) capsule 400 mg  Status:  Discontinued        400 mg Oral 3 times daily 06/11/11 1239 06/11/11 1343   06/08/11 2200   ceFEPIme (MAXIPIME) 2 g in dextrose 5 % 50 mL IVPB     Comments: Use Cefepime 1 g IV q12h for CrCl< 60 mL/min      2 g 100 mL/hr over 30 Minutes Intravenous Every 12 hours 06/08/11 1536 06/11/11 2320   06/08/11 0800   piperacillin-tazobactam (ZOSYN) IVPB 3.375 g  Status:  Discontinued        3.375 g 12.5 mL/hr over 240 Minutes Intravenous Every 8 hours 06/08/11 0149 06/08/11 1155   06/08/11 0600   micafungin (MYCAMINE) 100 mg in sodium chloride 0.9 % 100 mL IVPB  Status:  Discontinued        100 mg 100 mL/hr over 1 Hours Intravenous Daily 06/08/11 0308 06/10/11 1703   06/08/11 0200  piperacillin-tazobactam (ZOSYN) IVPB 3.375 g       3.375 g 100 mL/hr over 30 Minutes Intravenous  Once 06/08/11 0149 06/08/11 0254   06/06/11 0600   vancomycin (VANCOCIN) 1,750 mg in sodium chloride 0.9 % 500 mL  IVPB  Status:  Discontinued        1,750 mg 250 mL/hr over 120 Minutes Intravenous Every 24 hours 06/05/11 0906 06/08/11 0403   06/05/11 2200   levofloxacin (LEVAQUIN) IVPB 750 mg  Status:  Discontinued     Comments: Use Levaquin 750 mg IV q48h for CrCl < 58mL/min      750 mg 100 mL/hr over 90 Minutes Intravenous Every 48 hours 06/04/11 0344 06/05/11 0908   06/05/11 1000   levofloxacin (LEVAQUIN) IVPB 750 mg  Status:  Discontinued     Comments: Use Levaquin 750 mg IV q48h for CrCl < 67mL/min      750 mg 100 mL/hr over 90 Minutes Intravenous Every 24 hours 06/05/11 0908 06/06/11 0908   06/04/11 1200   sulfamethoxazole-trimethoprim (BACTRIM) 664 mg in dextrose 5 % 500 mL IVPB  Status:  Discontinued        20 mg/kg/day  132.7 kg 361 mL/hr over 90 Minutes Intravenous 4 times per day 06/04/11 1039 06/06/11 0908  06/04/11 1100   micafungin (MYCAMINE) 100 mg in sodium chloride 0.9 % 100 mL IVPB  Status:  Discontinued        100 mg 100 mL/hr over 1 Hours Intravenous Daily 06/04/11 1030 06/06/11 0908   06/04/11 0600   ceFEPIme (MAXIPIME) 1 g in dextrose 5 % 50 mL IVPB  Status:  Discontinued     Comments: Use Cefepime 1 g IV q12h for CrCl< 60 mL/min      1 g 100 mL/hr over 30 Minutes Intravenous Every 12 hours 06/04/11 0344 06/08/11 1536   06/04/11 0600   vancomycin (VANCOCIN) 1,500 mg in sodium chloride 0.9 % 500 mL IVPB  Status:  Discontinued        1,500 mg 250 mL/hr over 120 Minutes Intravenous Every 24 hours 06/04/11 0425 06/05/11 0906   06/04/11 0430   levofloxacin (LEVAQUIN) IVPB 750 mg        750 mg 100 mL/hr over 90 Minutes Intravenous  Once 06/04/11 0354 06/04/11 0632   06/03/11 2330   vancomycin (VANCOCIN) IVPB 1000 mg/200 mL premix        1,000 mg 200 mL/hr over 60 Minutes Intravenous  Once 06/03/11 2326 06/04/11 0051   06/03/11 2330  piperacillin-tazobactam (ZOSYN) IVPB 3.375 g       3.375 g 12.5 mL/hr over 240 Minutes Intravenous  Once 06/03/11 2326 06/04/11 0521           Medications: Scheduled Meds:   . antiseptic oral rinse  15 mL Mouth Rinse QID  . chlorhexidine  15 mL Mouth Rinse BID  . collagenase   Topical Daily  . darbepoetin (ARANESP) injection - DIALYSIS  100 mcg Intravenous Q Wed-HD  . feeding supplement (OXEPA)  1,000 mL Per Tube Q24H  . feeding supplement  60 mL Per Tube QID  . ferric gluconate (FERRLECIT/NULECIT) IV  125 mg Intravenous Daily  . heparin      . mulitivitamin  5 mL Per Tube Daily  . pantoprazole sodium  40 mg Per Tube BID  . potassium chloride  40 mEq Oral Daily  . predniSONE  10 mg Per Tube Q breakfast  . sirolimus  0.75 mg Oral Daily  . vancomycin  1,250 mg Intravenous Q24H   Continuous Infusions:   . sodium chloride 10 mL/hr at 06/16/11 0459  . amiodarone (NEXTERONE PREMIX) 360 mg/200 mL dextrose 0.5 mg/min (06/21/11 0218)  . dialysis replacement fluid (prismasate) 200 mL/hr at 06/20/11 1153  . dialysis replacement fluid (prismasate) 500 mL/hr at 06/20/11 1151  . dialysate (PRISMASATE) 2,000 mL/hr at 06/21/11 0853   PRN Meds:.albuterol, fentaNYL, HYDROmorphone (DILAUDID) injection, LORazepam, metoprolol, midazolam, DISCONTD: 0.9 % irrigation (POUR BTL), DISCONTD: fentaNYL, DISCONTD: fentaNYL, DISCONTD: heparin 6000 unit irrigation, DISCONTD: heparin, DISCONTD: lidocaine-EPINEPHrine, DISCONTD: midazolam   Objective: Weight change: -4 lb 6.5 oz (-2 kg)  Intake/Output Summary (Last 24 hours) at 06/21/11 1040 Last data filed at 06/21/11 0800  Gross per 24 hour  Intake 1677.3 ml  Output   4905 ml  Net -3227.7 ml   Blood pressure 117/80, pulse 109, temperature 98 F (36.7 C), temperature source Oral, resp. rate 29, height 5\' 11"  (1.803 m), weight 236 lb 8.9 oz (107.3 kg), SpO2 100.00%. Temp:  [97.6 F (36.4 C)-98.5 F (36.9 C)] 98 F (36.7 C) (05/25 0810) Pulse Rate:  [65-111] 109  (05/25 0917) Resp:  [16-29] 29  (05/25 0917) BP: (77-124)/(38-83) 117/80 mmHg (05/25 0917) SpO2:  [99 %-100 %] 100 % (05/25  0917) FiO2 (%):  [40 %-  50 %] 50 % (05/25 0917) Weight:  [236 lb 8.9 oz (107.3 kg)] 236 lb 8.9 oz (107.3 kg) (05/25 0500)  Physical Exam: General: Alert and awake, oriented  Neck: trach site dressed, L IJ clean, R HD line clean.  Resp: rhonchi bilaterally  Cardio: regular rate and rhythm  GI: normal findings: bowel sounds normal and soft,  Extremities: massive LE edema,  Erythema present, pt believes stable vs yesterday  Skin: no rashes Lymph: no new lymphadenopathy Neuro: nonfocal  Lab Results:  Basename 06/21/11 0430 06/20/11 0500  WBC 17.7* 23.5*  HGB 8.5* 8.4*  HCT 29.7* 29.0*  PLT 86* 95*    BMET  Basename 06/21/11 0430 06/20/11 1500  NA 138 135  K 3.6 4.2  CL 102 101  CO2 25 24  GLUCOSE 92 132*  BUN 34* 34*  CREATININE 1.45* 1.57*  CALCIUM 8.5 8.4    Micro Results: Recent Results (from the past 240 hour(s))  RESPIRATORY VIRUS PANEL (18 COMPONENTS)     Status: Abnormal   Collection Time   06/11/11  8:33 PM      Component Value Range Status Comment   Source - RVPAN BRONCHIAL ALVEOLAR LAVAGE   Final    Respiratory Syncytial Virus A NOT DETECTED   Final    Respiratory Syncytial Virus B NOT DETECTED   Final    Influenza A NOT DETECTED   Final    Influenza B NOT DETECTED   Final    Parainfluenza 1 NOT DETECTED   Final    Parainfluenza 2 NOT DETECTED   Final    Parainfluenza 3 DETECTED (*)  Final    Parainfluenza 4 NOT DETECTED   Final    Metapneumovirus NOT DETECTED   Final    Cox[place]ie and Echovirus NOT DETECTED   Final    Rhinovirus NOT DETECTED   Final    Adenovirus B NOT DETECTED   Final    Adenovirus E NOT DETECTED   Final    CoronavirusNL63 NOT DETECTED   Final    CoronavirusHKU1 NOT DETECTED   Final    Coronavirus229E NOT DETECTED   Final    CoronavirusOC43 NOT DETECTED   Final   CLOSTRIDIUM DIFFICILE BY PCR     Status: Normal   Collection Time   06/13/11  1:14 PM      Component Value Range Status Comment   C difficile by pcr NEGATIVE  NEGATIVE   Final   CLOSTRIDIUM DIFFICILE BY PCR     Status: Normal   Collection Time   06/19/11 10:25 AM      Component Value Range Status Comment   C difficile by pcr NEGATIVE  NEGATIVE  Final   CULTURE, BLOOD (ROUTINE X 2)     Status: Normal (Preliminary result)   Collection Time   06/19/11 10:57 AM      Component Value Range Status Comment   Specimen Description BLOOD LEFT ARM   Final    Special Requests BOTTLES DRAWN AEROBIC AND ANAEROBIC 10CC   Final    Culture  Setup Time [id number]   Final    Culture     Final    Value:        BLOOD CULTURE RECEIVED NO GROWTH TO DATE CULTURE WILL BE HELD FOR 5 DAYS BEFORE ISSUING A FINAL NEGATIVE REPORT   Report Status PENDING   Incomplete   CULTURE, BLOOD (ROUTINE X 2)     Status: Normal (Preliminary result)   Collection Time   06/19/11 10:58  AM      Component Value Range Status Comment   Specimen Description BLOOD LEFT HAND   Final    Special Requests BOTTLES DRAWN AEROBIC AND ANAEROBIC 10CC   Final    Culture  Setup Time [id number]   Final    Culture     Final    Value:        BLOOD CULTURE RECEIVED NO GROWTH TO DATE CULTURE WILL BE HELD FOR 5 DAYS BEFORE ISSUING A FINAL NEGATIVE REPORT   Report Status PENDING   Incomplete     Studies/Results: Dg Chest Port 1 View  06/20/2011  *RADIOLOGY REPORT*  Clinical Data: Cough.  Pneumonia.  PORTABLE CHEST - 1 VIEW  Comparison: 06/18/2011  Findings: 0529 hours.  Lung volumes are low. The cardiopericardial silhouette is enlarged.  Tracheostomy tube again noted. A feeding tube passes into the stomach although the distal tip position is not included on the film.  Right IJ central line tip projects at the distal SVC level. Telemetry leads overlie the chest.  IMPRESSION: Stable.  Cardiomegaly with low lung volumes and mild vascular congestion.  Original Report Authenticated By: ERIC A. MANSELL, M.D.   Dg Abd Portable 1v  06/20/2011  *RADIOLOGY REPORT*  Clinical Data: The feeding tube placement  PORTABLE ABDOMEN - 1  VIEW  Comparison: 06/19/2011  Findings: Supine view of the abdomen shows the feeding tube coiled in the distal stomach before passing through the pylorus.  The distal tip is positioned in the third segment of the duodenum.  IMPRESSION: Feeding tube is coiled in the stomach before passing through the pylorus with the tip in the third segment the duodenum.  Original Report Authenticated By: ERIC A. MANSELL, M.D.   Dg Abd Portable 1v  06/19/2011  *RADIOLOGY REPORT*  Clinical Data: The tube placement  PORTABLE ABDOMEN - 1 VIEW  Comparison: 06/19/2011  Findings: Feeding tube coils within the stomach and continues inferiorly, with tip projecting over the expected location of the second portion of the duodenum.  Nonobstructive bowel gas pattern. A left hip arthroplasty is noted however incompletely evaluated.  IMPRESSION: Feeding tube tip projects over the expected location of the second portion of the duodenum.  Original Report Authenticated By: Waneta Martins, M.D.   Dg Fluoro Guide Cv Line-no Report  06/20/2011  CLINICAL DATA: diatek   FLOURO GUIDE CV LINE  Fluoroscopy was utilized by the requesting physician.  No radiographic  interpretation.        Assessment/Plan: Chad [name] is a 56 y.o. male with renal tx pt recently rx for parainfluenza pna /?cardiomyopathy with IVIG and ribavirin now with cellulitis on IV vancomcyin. Has had elevated wbc now slightly improved  1) Cellulitis: continued vancomycin reasonable  2) elevated wbc: improved, if picture worsens would get CT abd/pelvis   LOS: 18 days   Acey Lav 06/21/2011, 10:40 AM

## 2011-06-21 NOTE — Progress Notes (Signed)
Chad Bottoms, MD, [facility] Board Certified in Adult Cardiovascular Medicine,Internal Medicine and Critical Care Medicine      Subjective:    Patient awake and alert but with relatively low blood pressures.  Pressors have been discontinued.  Patient has a tracheostomy in place.From a cardiac perspective he reports no palpitations chest pain or shortness of breath.  He continues on IV amiodarone    Objective:   Weight Range:  Vital Signs:   Temp:  [97.6 F (36.4 C)-98.5 F (36.9 C)] 97.6 F (36.4 C) (05/25 1206) Pulse Rate:  [65-111] 111  (05/25 1206) Resp:  [16-29] 27  (05/25 1206) BP: (77-117)/(38-83) 103/61 mmHg (05/25 1206) SpO2:  [98 %-100 %] 98 % (05/25 1206) FiO2 (%):  [40 %-50 %] 40 % (05/25 1206) Weight:  [236 lb 8.9 oz (107.3 kg)] 236 lb 8.9 oz (107.3 kg) (05/25 0500) Last BM Date: 06/17/11  Weight change: Filed Weights   06/19/11 0500 06/20/11 0500 06/21/11 0500  Weight: 252 lb 13.9 oz (114.7 kg) 240 lb 15.4 oz (109.3 kg) 236 lb 8.9 oz (107.3 kg)    Intake/Output:   Intake/Output Summary (Last 24 hours) at 06/21/11 1310 Last data filed at 06/21/11 1200  Gross per 24 hour  Intake 1393.9 ml  Output   5820 ml  Net -4426.1 ml     Physical Exam: General:  Ill-appearing male tracheostomy in place Lungs his breath sounds overall but no wheezing Heart regular rate and rhythm and tachycardic normal S1-S2 no pathological murmurs Abdomen soft and nontender Extremities no edema  Telemetry: Normal sinus rhythm with Will obtain EKG to confirm good possibly be atrial flutter  Labs: Basic Metabolic Panel:  Lab 06/21/11 4010 06/20/11 1500 06/20/11 0500 06/19/11 1650 06/19/11 0435 06/18/11 0522 06/17/11 0425  NA 138 135 136 134* 135 -- --  K 3.6 4.2 3.4* 4.2 4.0 -- --  CL 102 101 101 100 99 -- --  CO2 25 24 26 26 26  -- --  GLUCOSE 92 132* 104* 118* 96 -- --  BUN 34* 34* 34* 37* 39* -- --  CREATININE 1.45* 1.57* 1.45* 1.57* 1.58* -- --  CALCIUM 8.5 8.4 8.3* --  -- -- --  MG 2.4 -- 2.4 -- 2.2 2.2 2.0  PHOS 2.6 3.8 3.0 3.1 2.1* -- --    Liver Function Tests:  Lab 06/21/11 0430 06/20/11 1500 06/20/11 0500 06/19/11 1650 06/19/11 0435 06/17/11 0425  AST -- -- -- -- -- 126*  ALT -- -- -- -- -- 52  ALKPHOS -- -- -- -- -- 93  BILITOT -- -- -- -- -- 2.0*  PROT -- -- -- -- -- 7.8  ALBUMIN 2.1* 2.0* 2.1* 2.2* 2.1* --   No results found for this basename: LIPASE:5,AMYLASE:5 in the last 168 hours No results found for this basename: AMMONIA:3 in the last 168 hours  CBC:  Lab 06/21/11 0430 06/20/11 0500 06/18/11 1545 06/17/11 1349 06/17/11 0425 06/15/11 0514  WBC 17.7* 23.5* 23.6* 15.6* 17.0* --  NEUTROABS -- -- 18.9* 13.2* 13.4* 16.2*  HGB 8.5* 8.4* 7.9* 8.1* 7.9* --  HCT 29.7* 29.0* 27.1* 28.9* 27.5* --  MCV 109.2* 107.8* 105.0* 106.3* 103.8* --  PLT 86* 95* 99* 81* 69* --    Cardiac Enzymes: No results found for this basename: CKTOTAL:5,CKMB:5,CKMBINDEX:5,TROPONINI:5 in the last 168 hours   BNP: BNP (last 3 results)  Basename 06/07/11 0500 06/05/11 0435 06/03/11 2326  PROBNP 9971.0* 27253.6* 64403.4*     Other results: EKG: Pending Imaging:  Dg Chest Port 1 View  06/20/2011  *RADIOLOGY REPORT*  Clinical Data: Cough.  Pneumonia.  PORTABLE CHEST - 1 VIEW  Comparison: 06/18/2011  Findings: 0529 hours.  Lung volumes are low. The cardiopericardial silhouette is enlarged.  Tracheostomy tube again noted. A feeding tube passes into the stomach although the distal tip position is not included on the film.  Right IJ central line tip projects at the distal SVC level. Telemetry leads overlie the chest.  IMPRESSION: Stable.  Cardiomegaly with low lung volumes and mild vascular congestion.  Original Report Authenticated By: ERIC A. MANSELL, M.D.   Dg Abd Portable 1v  06/20/2011  *RADIOLOGY REPORT*  Clinical Data: The feeding tube placement  PORTABLE ABDOMEN - 1 VIEW  Comparison: 06/19/2011  Findings: Supine view of the abdomen shows the feeding tube  coiled in the distal stomach before passing through the pylorus.  The distal tip is positioned in the third segment of the duodenum.  IMPRESSION: Feeding tube is coiled in the stomach before passing through the pylorus with the tip in the third segment the duodenum.  Original Report Authenticated By: ERIC A. MANSELL, M.D.   Dg Abd Portable 1v  06/19/2011  *RADIOLOGY REPORT*  Clinical Data: The tube placement  PORTABLE ABDOMEN - 1 VIEW  Comparison: 06/19/2011  Findings: Feeding tube coils within the stomach and continues inferiorly, with tip projecting over the expected location of the second portion of the duodenum.  Nonobstructive bowel gas pattern. A left hip arthroplasty is noted however incompletely evaluated.  IMPRESSION: Feeding tube tip projects over the expected location of the second portion of the duodenum.  Original Report Authenticated By: Waneta Martins, M.D.   Dg Fluoro Guide Cv Line-no Report  06/20/2011  CLINICAL DATA: diatek   FLOURO GUIDE CV LINE  Fluoroscopy was utilized by the requesting physician.  No radiographic  interpretation.        Medications:     Scheduled Medications:    . antiseptic oral rinse  15 mL Mouth Rinse QID  . chlorhexidine  15 mL Mouth Rinse BID  . collagenase   Topical Daily  . darbepoetin (ARANESP) injection - DIALYSIS  100 mcg Intravenous Q Wed-HD  . feeding supplement (OXEPA)  1,000 mL Per Tube Q24H  . feeding supplement  60 mL Per Tube QID  . ferric gluconate (FERRLECIT/NULECIT) IV  125 mg Intravenous Daily  . heparin      . mulitivitamin  5 mL Per Tube Daily  . pantoprazole sodium  40 mg Per Tube BID  . potassium chloride  40 mEq Oral Daily  . predniSONE  10 mg Per Tube Q breakfast  . sirolimus  0.75 mg Oral Daily  . vancomycin  1,250 mg Intravenous Q24H     Infusions:    . sodium chloride 10 mL/hr at 06/16/11 0459  . amiodarone (NEXTERONE PREMIX) 360 mg/200 mL dextrose 0.5 mg/min (06/21/11 0218)  . dialysis replacement fluid  (prismasate) 200 mL/hr at 06/20/11 1153  . dialysis replacement fluid (prismasate) 500 mL/hr at 06/20/11 1151  . dialysate (PRISMASATE) 2,000 mL/hr at 06/21/11 1138     PRN Medications:  albuterol, fentaNYL, HYDROmorphone (DILAUDID) injection, LORazepam, metoprolol, midazolam, DISCONTD: fentaNYL, DISCONTD: fentaNYL, DISCONTD: midazolam   Assessment:   1. Healthcare-associated pneumonia   2. Renal insufficiency Vaginal CVVH  3. Atrial flutter with rapid ventricular response -Normal sinus rhythm on amiodarone but with severely dilated left atrium.  4. CHF (congestive heart failure)   5. Leukocytosis   6. Hypoxia Parainfluenza pneumonia  7. Edema extremities   8. Acute respiratory failure   9. ARDS (adult respiratory distress syndrome)   10. HCAP (healthcare-associated pneumonia)   11. Hyponatremia   12. Renal transplant disorder   13. Sepsis Off pressors  14. Acute systolic heart failure Ejection fraction 15-20% with segmental wall motion abnormalities by echo 06/08/2011 rule out septic cardiomyopathy but unlikely given segmental wall motion abnormalities.  15. Shock   16. Septic shock   17. Cardiomyopathy   18. Thrombocytopenia   19. Acute renal failure      Plan/Discussion:    A not sure at this point judging from the telemetry the patient is truly normal sinus rhythm.  We will obtain a 12-lead electrocardiogram area the patient is on IV amiodarone but I suspect given his severe left atrial enlargement it will be difficult to maintain him in normal sinus rhythm. We'll switch to amiodarone per NG tube tomorrow. Patient is hemodynamically stable and have made no changes in medical regimen. The patient should have an echocardiogram scheduled for Tuesday to reassess his LV function and based on this adjust his medications further as needed.     Length of Stay: [address] [place] 06/21/2011, 1:10 PM

## 2011-06-22 ENCOUNTER — Inpatient Hospital Stay (HOSPITAL_COMMUNITY): Payer: BC Managed Care – PPO

## 2011-06-22 LAB — CBC
MCH: 31.9 pg (ref 26.0–34.0)
MCHC: 28.4 g/dL — ABNORMAL LOW (ref 30.0–36.0)
MCV: 112.5 fL — ABNORMAL HIGH (ref 78.0–100.0)
Platelets: 92 10*3/uL — ABNORMAL LOW (ref 150–400)
RDW: 28.2 % — ABNORMAL HIGH (ref 11.5–15.5)

## 2011-06-22 LAB — HEPATIC FUNCTION PANEL
AST: 154 U/L — ABNORMAL HIGH (ref 0–37)
Albumin: 2.3 g/dL — ABNORMAL LOW (ref 3.5–5.2)
Alkaline Phosphatase: 259 U/L — ABNORMAL HIGH (ref 39–117)
Bilirubin, Direct: 1 mg/dL — ABNORMAL HIGH (ref 0.0–0.3)
Total Bilirubin: 2.2 mg/dL — ABNORMAL HIGH (ref 0.3–1.2)

## 2011-06-22 LAB — RENAL FUNCTION PANEL
Calcium: 8.5 mg/dL (ref 8.4–10.5)
Creatinine, Ser: 1.29 mg/dL (ref 0.50–1.35)
GFR calc non Af Amer: 61 mL/min — ABNORMAL LOW (ref >90)
Glucose, Bld: 97 mg/dL (ref 70–99)
Potassium: 4 mEq/L (ref 3.5–5.1)
Sodium: 134 mEq/L — ABNORMAL LOW (ref 135–145)

## 2011-06-22 MED ORDER — WHITE PETROLATUM GEL
Status: AC
  Filled 2011-06-22: qty 5

## 2011-06-22 MED ORDER — HEPARIN SODIUM (PORCINE) 1000 UNIT/ML IJ SOLN
INTRAMUSCULAR | Status: AC
  Administered 2011-06-22: 3000 [IU]
  Filled 2011-06-22: qty 3

## 2011-06-22 MED ORDER — ONDANSETRON HCL 4 MG/2ML IJ SOLN
INTRAMUSCULAR | Status: AC
  Filled 2011-06-22: qty 2

## 2011-06-22 MED ORDER — ONDANSETRON HCL 4 MG/2ML IJ SOLN
4.0000 mg | Freq: Four times a day (QID) | INTRAMUSCULAR | Status: DC | PRN
Start: 2011-06-22 — End: 2011-06-27
  Administered 2011-06-22: 4 mg via INTRAVENOUS

## 2011-06-22 NOTE — Progress Notes (Signed)
ASSName: Chad [name] MRN: [id number] DOB: 03-Oct-1955    LOS: 19  PCCM  NOTE  History of Present Illness:  56 yo male admitted 06/03/2011 with cough, dyspnea and fever.  Progressed to [facility] 05/08 and PCCM consulted.  Has hx of renal transplant. PMHx Hep C, HTN, Gout  Cultures: BAL bronch 5/8>>>neg    AFB >> neg   Viral panel >> POS parainfluenza virus   Pneumocystis >> neg   Fungus >> neg   HSV >> neg BC 5/7>>>neg  Blood X 2 5/12 >> neg  Antibiotics: (per ID) 5/7 levoflox>>>5/10 5/7 bactrim >>>5/10 5/7 micafungin >> 5/14 5/7 vanc >> 5/10 5/10 cefepime >> 5/15 5/15 Ribaviran/IVIG >>Completed  Tests / Events: 5/8 hypoxic resp failure, intubated, near arrest 5/11 worsening shock , on sepsis protocol + dobut for low co-ox 5/15 Family mtg: continue full aggressive support but NCB if arrests. To reassess in 4-5 days. If no progress, consider withdrawal at that time 5/15 Trial of Ribaviran/IVIG 5/16 Markedly improved - reduced pressors, improved cognition, gas exchange improved 5/17 continued improvement. Tolerates PS 5 cm H2O with Vt > 800cc 5/17- extubated, re intubated 5/20- trach 5/24 Tunneled HD cath placed 5/26 CRRT stopped  Subjective: Doing better.  Tolerating trach collar.  Off CVVH.  Sitting in chair.  Vital Signs: Temp:  [97.6 F (36.4 C)-98.6 F (37 C)] 98.3 F (36.8 C) (05/26 0401) Pulse Rate:  [25-116] 112  (05/26 0818) Resp:  [16-31] 26  (05/26 0818) BP: (88-128)/(58-81) 89/69 mmHg (05/26 0818) SpO2:  [70 %-100 %] 100 % (05/26 0818) FiO2 (%):  [30 %-40 %] 30 % (05/26 0818) Weight:  [226 lb 10.1 oz (102.8 kg)] 226 lb 10.1 oz (102.8 kg) (05/26 0400) I/O last 3 completed shifts: In: 2830.5 [I.V.:1284.5; NG/GT:1270; IV Piggyback:276] Out: 8970 [Urine:71; NWGNF:6213; Stool:1350]   Physical Examination: General - no distress HEENT - trach site clean, PANDA in place Cardiac - s1s2 regular, no murmur Chest - scattered rhonchi Abd - soft, non tender,  +bowel sounds Ext - 2+edema Neuro - alert, follows commands, moves all extremities   Ventilator settings: Vent Mode:  [-] CPAP FiO2 (%):  [30 %-40 %] 30 % Set Rate:  [20 bmp] 20 bmp Vt Set:  [600 mL] 600 mL PEEP:  [5 cmH20] 5 cmH20 Pressure Support:  [5 cmH20] 5 cmH20 Plateau Pressure:  [14 cmH20-17 cmH20] 17 cmH20   CBC    Component Value Date/Time   WBC 17.3* 06/22/2011 0411   RBC 2.88* 06/22/2011 0411   HGB 9.2* 06/22/2011 0411   HCT 32.4* 06/22/2011 0411   PLT 92* 06/22/2011 0411   MCV 112.5* 06/22/2011 0411   MCH 31.9 06/22/2011 0411   MCHC 28.4* 06/22/2011 0411   RDW 28.2* 06/22/2011 0411   LYMPHSABS 2.1 06/18/2011 1545   MONOABS 2.4* 06/18/2011 1545   EOSABS 0.0 06/18/2011 1545   BASOSABS 0.2* 06/18/2011 1545    BMET    Component Value Date/Time   NA 134* 06/22/2011 0411   K 4.0 06/22/2011 0411   CL 99 06/22/2011 0411   CO2 25 06/22/2011 0411   GLUCOSE 97 06/22/2011 0411   BUN 30* 06/22/2011 0411   CREATININE 1.29 06/22/2011 0411   CALCIUM 8.5 06/22/2011 0411   GFRNONAA 61* 06/22/2011 0411   GFRAA 71* 06/22/2011 0411    Dg Chest Port 1 View  06/22/2011  *RADIOLOGY REPORT*  Clinical Data: Follow up pneumonia.  PORTABLE CHEST - 1 VIEW  Comparison: 06/20/2011  Findings: New right jugular dialysis  catheter tip in the lower SVC. Feeding tube extends into the abdomen.  There is a tracheostomy tube.  The patient has low lung volumes.  Densities along the medial left lung base could represent atelectasis or airspace disease.  No evidence for a pneumothorax. Left jugular central line tip in the SVC.  IMPRESSION: Placement of a right jugular tunneled dialysis catheter.  Catheter tip in the lower SVC.  Left basilar densities suggestive for atelectasis or focal airspace disease.  Original Report Authenticated By: Richarda Overlie, M.D.   Dg Fluoro Guide Cv Line-no Report  06/20/2011  CLINICAL DATA: diatek   FLOURO GUIDE CV LINE  Fluoroscopy was utilized by the requesting physician.  No radiographic   interpretation.       Assessment and Plan:  PULMONARY  Bronchoscopy 05/08>>diffuse, thick secretions ETT 5/8 >>5/17>>>retubed 6 hrs>>>5/20 Trach 5/20 (df)>>>  Acute respiratory failure 2nd to pneumonia, ARDS.  Required trach 05/20. Plan: -f/u CXR intermittently -trach collar as tolerated>>only use vent as needed -speech therapy to assess for speech valve once off vent during day   CARDIAC Lt IJ CVL 05/08>> Rt Radial aline 05/08>>out R IJ HD cath 5/12 >> 5/13 (inadvertent removal) R IJ HD cath 5/13 >>5/24 Tunneled HD cath 5/24>>   5/08 Doppler legs>>negative for DVT 5/12 Echo>>EF 15 to 20%, mild LVH, diffuse hypokinesis, mild MR, mod/severe LA dilation, mod RV systolic dysfx, PAS 52 mmHg 5/23 Doppler legs>>negative for DVT  Shock>>likely septic and cardiogenic.  Resolved.  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 Plan: -continue amiodarone per cardiology>>plan to change to enteral amiodarone 5/26   RENAL  Acute on chronic renal failure with hx of renal transplant.  CVVH started 5/12>>stopped 5/26. Plan: -HD per renal -continue sirolimus, prednisone   ID  Blood 5/23>> C diff 5/23>>negative  Vancomycin 5/23>>  Parainfluenza pneumonia.  Given IVIG 5/15.  Ribavarin started 5/16, and completed 7 day course. Plan: -monitor clinically off therapy  Cellulitis. -D4/x Vancomycin per ID  Fever.  No recurrence since 5/22.   -If persists may need CT abd/pelvis, and broaden Abx   HEMATOLOGY  Anemia of critical illness, chronic disease, and renal failure. Plan: -f/u CBC -transfuse for Hb < 7 -continue nulecit, aranesp per renal  Leukocytosis>>improving.  Thrombocytopenia. Plan: -f/u CBC   ENDOCRINE  Steroid induced hyperglycemia. Plan: -monitor blood sugar on BMET   NEUROLOGY  Mild agitation.  Improved. Plan: -monitor mental  status   GASTROENTEROLOGY  Nurtition. Plan: -continue tube feeds -if remains off vent, then can proceed with swallow eval  Hx of Hepatitis C. Plan: -f/u LFT intermittently   BEST PRACTICE -BID protonix for SUP -SCD for DVT prophylaxis -Full code -PT/OT  -transfer to SDU, keep on PCCM service  Coralyn Helling, MD 06/22/2011, 10:19 AM Pager:  [phone]

## 2011-06-22 NOTE — Progress Notes (Signed)
Subjective: Feels better, sitting in chair   Antibiotics:  Anti-infectives     Start     Dose/Rate Route Frequency Ordered Stop   06/20/11 1000   vancomycin (VANCOCIN) 1,250 mg in sodium chloride 0.9 % 250 mL IVPB  Status:  Discontinued        1,250 mg 166.7 mL/hr over 90 Minutes Intravenous Every 24 hours 06/19/11 0957 06/22/11 1058   06/19/11 1000   vancomycin (VANCOCIN) 1,750 mg in sodium chloride 0.9 % 500 mL IVPB        1,750 mg 250 mL/hr over 120 Minutes Intravenous NOW 06/19/11 0956 06/19/11 1434   06/13/11 1200   vancomycin (VANCOCIN) 50 mg/mL oral solution 500 mg  Status:  Discontinued        500 mg Per Tube 4 times per day 06/13/11 1045 06/13/11 1600   06/13/11 1200   metroNIDAZOLE (FLAGYL) IVPB 500 mg  Status:  Discontinued        500 mg 100 mL/hr over 60 Minutes Intravenous Every 8 hours 06/13/11 1045 06/13/11 1600   06/12/11 1000   Ribavirin SOLN 400 mg  Status:  Discontinued     Comments: Special order item.       400 mg Per Tube 3 times daily 06/11/11 1346 06/18/11 1553   06/11/11 1600   ribavirin (REBETOL) capsule 400 mg  Status:  Discontinued        400 mg Oral 3 times daily 06/11/11 1239 06/11/11 1343   06/08/11 2200   ceFEPIme (MAXIPIME) 2 g in dextrose 5 % 50 mL IVPB     Comments: Use Cefepime 1 g IV q12h for CrCl< 60 mL/min      2 g 100 mL/hr over 30 Minutes Intravenous Every 12 hours 06/08/11 1536 06/11/11 2320   06/08/11 0800   piperacillin-tazobactam (ZOSYN) IVPB 3.375 g  Status:  Discontinued        3.375 g 12.5 mL/hr over 240 Minutes Intravenous Every 8 hours 06/08/11 0149 06/08/11 1155   06/08/11 0600   micafungin (MYCAMINE) 100 mg in sodium chloride 0.9 % 100 mL IVPB  Status:  Discontinued        100 mg 100 mL/hr over 1 Hours Intravenous Daily 06/08/11 0308 06/10/11 1703   06/08/11 0200  piperacillin-tazobactam (ZOSYN) IVPB 3.375 g       3.375 g 100 mL/hr over 30 Minutes Intravenous  Once 06/08/11 0149 06/08/11 0254   06/06/11 0600    vancomycin (VANCOCIN) 1,750 mg in sodium chloride 0.9 % 500 mL IVPB  Status:  Discontinued        1,750 mg 250 mL/hr over 120 Minutes Intravenous Every 24 hours 06/05/11 0906 06/08/11 0403   06/05/11 2200   levofloxacin (LEVAQUIN) IVPB 750 mg  Status:  Discontinued     Comments: Use Levaquin 750 mg IV q48h for CrCl < 39mL/min      750 mg 100 mL/hr over 90 Minutes Intravenous Every 48 hours 06/04/11 0344 06/05/11 0908   06/05/11 1000   levofloxacin (LEVAQUIN) IVPB 750 mg  Status:  Discontinued     Comments: Use Levaquin 750 mg IV q48h for CrCl < 51mL/min      750 mg 100 mL/hr over 90 Minutes Intravenous Every 24 hours 06/05/11 0908 06/06/11 0908   06/04/11 1200   sulfamethoxazole-trimethoprim (BACTRIM) 664 mg in dextrose 5 % 500 mL IVPB  Status:  Discontinued        20 mg/kg/day  132.7 kg 361 mL/hr over 90 Minutes Intravenous 4 times per  day 06/04/11 1039 06/06/11 0908   06/04/11 1100   micafungin (MYCAMINE) 100 mg in sodium chloride 0.9 % 100 mL IVPB  Status:  Discontinued        100 mg 100 mL/hr over 1 Hours Intravenous Daily 06/04/11 1030 06/06/11 0908   06/04/11 0600   ceFEPIme (MAXIPIME) 1 g in dextrose 5 % 50 mL IVPB  Status:  Discontinued     Comments: Use Cefepime 1 g IV q12h for CrCl< 60 mL/min      1 g 100 mL/hr over 30 Minutes Intravenous Every 12 hours 06/04/11 0344 06/08/11 1536   06/04/11 0600   vancomycin (VANCOCIN) 1,500 mg in sodium chloride 0.9 % 500 mL IVPB  Status:  Discontinued        1,500 mg 250 mL/hr over 120 Minutes Intravenous Every 24 hours 06/04/11 0425 06/05/11 0906   06/04/11 0430   levofloxacin (LEVAQUIN) IVPB 750 mg        750 mg 100 mL/hr over 90 Minutes Intravenous  Once 06/04/11 0354 06/04/11 0632   06/03/11 2330   vancomycin (VANCOCIN) IVPB 1000 mg/200 mL premix        1,000 mg 200 mL/hr over 60 Minutes Intravenous  Once 06/03/11 2326 06/04/11 0051   06/03/11 2330  piperacillin-tazobactam (ZOSYN) IVPB 3.375 g       3.375 g 12.5 mL/hr over  240 Minutes Intravenous  Once 06/03/11 2326 06/04/11 0521          Medications: Scheduled Meds:    . antiseptic oral rinse  15 mL Mouth Rinse QID  . chlorhexidine  15 mL Mouth Rinse BID  . collagenase   Topical Daily  . darbepoetin (ARANESP) injection - DIALYSIS  100 mcg Intravenous Q Wed-HD  . feeding supplement (OXEPA)  1,000 mL Per Tube Q24H  . feeding supplement  60 mL Per Tube QID  . ferric gluconate (FERRLECIT/NULECIT) IV  125 mg Intravenous Daily  . heparin      . heparin      . mulitivitamin  5 mL Per Tube Daily  . pantoprazole sodium  40 mg Per Tube BID  . potassium chloride  40 mEq Oral Daily  . predniSONE  10 mg Per Tube Q breakfast  . sirolimus  0.75 mg Oral Daily  . DISCONTD: vancomycin  1,250 mg Intravenous Q24H   Continuous Infusions:    . sodium chloride 20 mL/hr at 06/22/11 1022  . amiodarone (NEXTERONE PREMIX) 360 mg/200 mL dextrose 0.5 mg/min (06/22/11 0105)  . DISCONTD: dialysis replacement fluid (prismasate) 200 mL/hr at 06/20/11 1153  . DISCONTD: dialysis replacement fluid (prismasate) 500 mL/hr at 06/21/11 2347  . DISCONTD: dialysate (PRISMASATE) 2,000 mL/hr at 06/22/11 0713   PRN Meds:.albuterol, fentaNYL, HYDROmorphone (DILAUDID) injection, metoprolol, midazolam, ondansetron (ZOFRAN) IV   Objective: Weight change: -9 lb 14.7 oz (-4.5 kg)  Intake/Output Summary (Last 24 hours) at 06/22/11 1200 Last data filed at 06/22/11 0800  Gross per 24 hour  Intake 1427.3 ml  Output   4811 ml  Net -3383.7 ml   Blood pressure 89/69, pulse 112, temperature 98.3 F (36.8 C), temperature source Oral, resp. rate 26, height 5\' 11"  (1.803 m), weight 226 lb 10.1 oz (102.8 kg), SpO2 100.00%. Temp:  [97.6 F (36.4 C)-98.6 F (37 C)] 98.3 F (36.8 C) (05/26 0401) Pulse Rate:  [25-116] 112  (05/26 0818) Resp:  [16-31] 26  (05/26 0818) BP: (88-128)/(58-81) 89/69 mmHg (05/26 0818) SpO2:  [70 %-100 %] 100 % (05/26 0818) FiO2 (%):  [30 %-  40 %] 30 % (05/26  0818) Weight:  [226 lb 10.1 oz (102.8 kg)] 226 lb 10.1 oz (102.8 kg) (05/26 0400)  Physical Exam: General: Alert and awake, oriented  Neck: trach site dressed, L IJ clean, R HD line clean.  Resp: rhonchi bilaterally  Cardio: regular rate and rhythm  GI: normal findings: bowel sounds normal and soft,  Extremities: massive LE edema,  Erythema present, appears improved, slight tenderness left calf Skin: no rashes Lymph: no new lymphadenopathy Neuro: nonfocal  Lab Results:  Basename 06/22/11 0411 06/21/11 0430  WBC 17.3* 17.7*  HGB 9.2* 8.5*  HCT 32.4* 29.7*  PLT 92* 86*    BMET  Basename 06/22/11 0411 06/21/11 0430  NA 134* 138  K 4.0 3.6  CL 99 102  CO2 25 25  GLUCOSE 97 92  BUN 30* 34*  CREATININE 1.29 1.45*  CALCIUM 8.5 8.5    Micro Results: Recent Results (from the past 240 hour(s))  CLOSTRIDIUM DIFFICILE BY PCR     Status: Normal   Collection Time   06/13/11  1:14 PM      Component Value Range Status Comment   C difficile by pcr NEGATIVE  NEGATIVE  Final   CLOSTRIDIUM DIFFICILE BY PCR     Status: Normal   Collection Time   06/19/11 10:25 AM      Component Value Range Status Comment   C difficile by pcr NEGATIVE  NEGATIVE  Final   CULTURE, BLOOD (ROUTINE X 2)     Status: Normal (Preliminary result)   Collection Time   06/19/11 10:57 AM      Component Value Range Status Comment   Specimen Description BLOOD LEFT ARM   Final    Special Requests BOTTLES DRAWN AEROBIC AND ANAEROBIC 10CC   Final    Culture  Setup Time [id number]   Final    Culture     Final    Value:        BLOOD CULTURE RECEIVED NO GROWTH TO DATE CULTURE WILL BE HELD FOR 5 DAYS BEFORE ISSUING A FINAL NEGATIVE REPORT   Report Status PENDING   Incomplete   CULTURE, BLOOD (ROUTINE X 2)     Status: Normal (Preliminary result)   Collection Time   06/19/11 10:58 AM      Component Value Range Status Comment   Specimen Description BLOOD LEFT HAND   Final    Special Requests BOTTLES DRAWN AEROBIC AND  ANAEROBIC 10CC   Final    Culture  Setup Time [id number]   Final    Culture     Final    Value:        BLOOD CULTURE RECEIVED NO GROWTH TO DATE CULTURE WILL BE HELD FOR 5 DAYS BEFORE ISSUING A FINAL NEGATIVE REPORT   Report Status PENDING   Incomplete     Studies/Results: Dg Chest Port 1 View  06/22/2011  *RADIOLOGY REPORT*  Clinical Data: Follow up pneumonia.  PORTABLE CHEST - 1 VIEW  Comparison: 06/20/2011  Findings: New right jugular dialysis catheter tip in the lower SVC. Feeding tube extends into the abdomen.  There is a tracheostomy tube.  The patient has low lung volumes.  Densities along the medial left lung base could represent atelectasis or airspace disease.  No evidence for a pneumothorax. Left jugular central line tip in the SVC.  IMPRESSION: Placement of a right jugular tunneled dialysis catheter.  Catheter tip in the lower SVC.  Left basilar densities suggestive for atelectasis or focal airspace disease.  Original Report Authenticated By: Richarda Overlie, M.D.      Assessment/Plan: Chad [name] is a 56 y.o. male with renal tx pt recently rx for parainfluenza pna /?cardiomyopathy with IVIG and ribavirin now with cellulitis on IV vancomcyin. Has had elevated wbc now slightly improved  1) Cellulitis: continued vancomycin and would complete 7 days of anti MRSA/MSSA strep antibiotics     LOS: 19 days   Acey Lav 06/22/2011, 12:00 PM

## 2011-06-22 NOTE — Progress Notes (Signed)
ANTIBIOTIC CONSULT NOTE - FOLLOW UP  Pharmacy Consult for Vancomycin Indication: cellulitis  No Known Allergies  Patient Measurements: Height: 5\' 11"  (180.3 cm) Weight: 226 lb 10.1 oz (102.8 kg) IBW/kg (Calculated) : 75.3   Vital Signs: Temp: 98.3 F (36.8 C) (05/26 0401) Temp src: Oral (05/26 0401) BP: 89/69 mmHg (05/26 0818) Pulse Rate: 112  (05/26 0818) Intake/Output from previous day: 05/25 0701 - 05/26 0700 In: 2030.1 [I.V.:844.1; NG/GT:910; IV Piggyback:276] Out: 5753 [Urine:40; Stool:600] Intake/Output from this shift: Total I/O In: -  Out: 214 [Other:214]  Labs:  [facility] 06/22/11 0411 06/21/11 0430 06/20/11 1500 06/20/11 0500  WBC 17.3* 17.7* -- 23.5*  HGB 9.2* 8.5* -- 8.4*  PLT 92* 86* -- 95*  LABCREA -- -- -- --  CREATININE 1.29 1.45* 1.57* --   Estimated Creatinine Clearance: 79.9 ml/min (by C-G formula based on Cr of 1.29). No results found for this basename: VANCOTROUGH:2,VANCOPEAK:2,VANCORANDOM:2,GENTTROUGH:2,GENTPEAK:2,GENTRANDOM:2,TOBRATROUGH:2,TOBRAPEAK:2,TOBRARND:2,AMIKACINPEAK:2,AMIKACINTROU:2,AMIKACIN:2, in the last 72 hours   Microbiology: Recent Results (from the past 720 hour(s))  URINE CULTURE     Status: Normal   Collection Time   06/03/11 10:53 PM      Component Value Range Status Comment   Specimen Description URINE, RANDOM   Final    Special Requests ADDED [id number]   Final    Culture  Setup Time [id number]   Final    Colony Count >=100,000 COLONIES/ML   Final    Culture     Final    Value: Multiple bacterial morphotypes present, none predominant. Suggest appropriate recollection if clinically indicated.   Report Status 06/05/2011 FINAL   Final   CULTURE, BLOOD (ROUTINE X 2)     Status: Normal   Collection Time   06/03/11 11:45 PM      Component Value Range Status Comment   Specimen Description BLOOD RIGHT ARM   Final    Special Requests BOTTLES DRAWN AEROBIC AND ANAEROBIC 10CC   Final    Culture  Setup Time [id number]   Final      Culture NO GROWTH 5 DAYS   Final    Report Status 06/10/2011 FINAL   Final   CULTURE, BLOOD (ROUTINE X 2)     Status: Normal   Collection Time   06/03/11 11:50 PM      Component Value Range Status Comment   Specimen Description BLOOD RIGHT ARM   Final    Special Requests     Final    Value: BOTTLES DRAWN AEROBIC AND ANAEROBIC 5CC AEROBOC 3CC ANAEROBIC   Culture  Setup Time [id number]   Final    Culture NO GROWTH 5 DAYS   Final    Report Status 06/10/2011 FINAL   Final   MRSA PCR SCREENING     Status: Normal   Collection Time   06/04/11  3:08 AM      Component Value Range Status Comment   MRSA by PCR NEGATIVE  NEGATIVE  Final   RESPIRATORY VIRUS PANEL (18 COMPONENTS)     Status: Abnormal   Collection Time   06/04/11  6:09 AM      Component Value Range Status Comment   Source - RVPAN NASAL MUCOSA   Final    Respiratory Syncytial Virus A NOT DETECTED   Final    Respiratory Syncytial Virus B NOT DETECTED   Final    Influenza A NOT DETECTED   Final    Influenza B NOT DETECTED   Final    Parainfluenza 1 NOT DETECTED   Final  Parainfluenza 2 NOT DETECTED   Final    Parainfluenza 3 DETECTED (*)  Final    Parainfluenza 4 NOT DETECTED   Final    Metapneumovirus NOT DETECTED   Final    Coxsackie and Echovirus NOT DETECTED   Final    Rhinovirus NOT DETECTED   Final    Adenovirus B NOT DETECTED   Final    Adenovirus E NOT DETECTED   Final    CoronavirusNL63 NOT DETECTED   Final    CoronavirusHKU1 NOT DETECTED   Final    Coronavirus229E NOT DETECTED   Final    CoronavirusOC43 NOT DETECTED   Final   CULTURE, EXPECTORATED SPUTUM-ASSESSMENT     Status: Normal   Collection Time   06/04/11  7:48 AM      Component Value Range Status Comment   Specimen Description SPUTUM   Final    Special Requests NONE   Final    Sputum evaluation     Final    Value: MICROSCOPIC FINDINGS SUGGEST THAT THIS SPECIMEN IS NOT REPRESENTATIVE OF LOWER RESPIRATORY SECRETIONS. PLEASE RECOLLECT.     CALLED TO D  ORTIZ,RN 06/04/11 0911 BY K SCHULTZ   Report Status 06/04/2011 FINAL   Final   AFB CULTURE WITH SMEAR     Status: Normal (Preliminary result)   Collection Time   06/04/11 10:30 AM      Component Value Range Status Comment   Specimen Description BRONCHIAL WASHINGS   Final    Special Requests NONE   Final    ACID FAST SMEAR NO ACID FAST BACILLI SEEN   Final    Culture     Final    Value: CULTURE WILL BE EXAMINED FOR 6 WEEKS BEFORE ISSUING A FINAL REPORT   Report Status PENDING   Incomplete   PNEUMOCYSTIS JIROVECI SMEAR BY DFA     Status: Normal   Collection Time   06/04/11 10:30 AM      Component Value Range Status Comment   Specimen Source-PJSRC BRONCHIAL WASHINGS   Final    Pneumocystis jiroveci Ag NEGATIVE   Final Performed at [facility]  FUNGUS CULTURE W SMEAR     Status: Normal (Preliminary result)   Collection Time   06/04/11 10:30 AM      Component Value Range Status Comment   Specimen Description BRONCHIAL WASHINGS   Final    Special Requests NONE   Final    Fungal Smear NO YEAST OR FUNGAL ELEMENTS SEEN   Final    Culture CULTURE IN PROGRESS FOR FOUR WEEKS   Final    Report Status PENDING   Incomplete   CULTURE, RESPIRATORY     Status: Normal   Collection Time   06/04/11 10:30 AM      Component Value Range Status Comment   Specimen Description BRONCHIAL WASHINGS   Final    Special Requests NONE   Final    Gram Stain     Final    Value: FEW WBC PRESENT,BOTH PMN AND MONONUCLEAR     NO SQUAMOUS EPITHELIAL CELLS SEEN     NO ORGANISMS SEEN   Culture NO GROWTH 2 DAYS   Final    Report Status 06/07/2011 FINAL   Final   HSV PCR     Status: Normal   Collection Time   06/04/11 10:30 AM      Component Value Range Status Comment   HSV, PCR Not Detected  Not Detected  Final  HSV 2 , PCR Not Detected  Not Detected  Final    Specimen Source-HSVPCR NO GROWTH   Final   CULTURE, BLOOD (ROUTINE X 2)     Status: Normal   Collection Time   06/04/11  3:15 PM      Component Value  Range Status Comment   Specimen Description BLOOD RIGHT HAND   Final    Special Requests BOTTLES DRAWN AEROBIC AND ANAEROBIC 10CC   Final    Culture  Setup Time [id number]   Final    Culture NO GROWTH 5 DAYS   Final    Report Status 06/11/2011 FINAL   Final   CULTURE, BLOOD (ROUTINE X 2)     Status: Normal   Collection Time   06/04/11  3:27 PM      Component Value Range Status Comment   Specimen Description BLOOD LEFT HAND   Final    Special Requests BOTTLES DRAWN AEROBIC ONLY 3CC   Final    Culture  Setup Time [id number]   Final    Culture NO GROWTH 5 DAYS   Final    Report Status 06/11/2011 FINAL   Final   CULTURE, BLOOD (ROUTINE X 2)     Status: Normal   Collection Time   06/08/11  4:15 AM      Component Value Range Status Comment   Specimen Description BLOOD LEFT HAND   Final    Special Requests BOTTLES DRAWN AEROBIC ONLY 10CC   Final    Culture  Setup Time [id number]   Final    Culture NO GROWTH 5 DAYS   Final    Report Status 06/14/2011 FINAL   Final   CULTURE, BLOOD (ROUTINE X 2)     Status: Normal   Collection Time   06/08/11  4:20 AM      Component Value Range Status Comment   Specimen Description BLOOD LEFT HAND   Final    Special Requests BOTTLES DRAWN AEROBIC ONLY 10CC   Final    Culture  Setup Time [id number]   Final    Culture NO GROWTH 5 DAYS   Final    Report Status 06/14/2011 FINAL   Final   RESPIRATORY VIRUS PANEL (18 COMPONENTS)     Status: Abnormal   Collection Time   06/11/11  8:33 PM      Component Value Range Status Comment   Source - RVPAN BRONCHIAL ALVEOLAR LAVAGE   Final    Respiratory Syncytial Virus A NOT DETECTED   Final    Respiratory Syncytial Virus B NOT DETECTED   Final    Influenza A NOT DETECTED   Final    Influenza B NOT DETECTED   Final    Parainfluenza 1 NOT DETECTED   Final    Parainfluenza 2 NOT DETECTED   Final    Parainfluenza 3 DETECTED (*)  Final    Parainfluenza 4 NOT DETECTED   Final    Metapneumovirus NOT DETECTED   Final     Coxsackie and Echovirus NOT DETECTED   Final    Rhinovirus NOT DETECTED   Final    Adenovirus B NOT DETECTED   Final    Adenovirus E NOT DETECTED   Final    CoronavirusNL63 NOT DETECTED   Final    CoronavirusHKU1 NOT DETECTED   Final    Coronavirus229E NOT DETECTED   Final    CoronavirusOC43 NOT DETECTED   Final   CLOSTRIDIUM DIFFICILE BY PCR     Status: Normal  Collection Time   06/13/11  1:14 PM      Component Value Range Status Comment   C difficile by pcr NEGATIVE  NEGATIVE  Final   CLOSTRIDIUM DIFFICILE BY PCR     Status: Normal   Collection Time   06/19/11 10:25 AM      Component Value Range Status Comment   C difficile by pcr NEGATIVE  NEGATIVE  Final   CULTURE, BLOOD (ROUTINE X 2)     Status: Normal (Preliminary result)   Collection Time   06/19/11 10:57 AM      Component Value Range Status Comment   Specimen Description BLOOD LEFT ARM   Final    Special Requests BOTTLES DRAWN AEROBIC AND ANAEROBIC 10CC   Final    Culture  Setup Time [id number]   Final    Culture     Final    Value:        BLOOD CULTURE RECEIVED NO GROWTH TO DATE CULTURE WILL BE HELD FOR 5 DAYS BEFORE ISSUING A FINAL NEGATIVE REPORT   Report Status PENDING   Incomplete   CULTURE, BLOOD (ROUTINE X 2)     Status: Normal (Preliminary result)   Collection Time   06/19/11 10:58 AM      Component Value Range Status Comment   Specimen Description BLOOD LEFT HAND   Final    Special Requests BOTTLES DRAWN AEROBIC AND ANAEROBIC 10CC   Final    Culture  Setup Time [id number]   Final    Culture     Final    Value:        BLOOD CULTURE RECEIVED NO GROWTH TO DATE CULTURE WILL BE HELD FOR 5 DAYS BEFORE ISSUING A FINAL NEGATIVE REPORT   Report Status PENDING   Incomplete     Anti-infectives     Start     Dose/Rate Route Frequency Ordered Stop   06/20/11 1000   vancomycin (VANCOCIN) 1,250 mg in sodium chloride 0.9 % 250 mL IVPB        1,250 mg 166.7 mL/hr over 90 Minutes Intravenous Every 24 hours 06/19/11  0957     06/19/11 1000   vancomycin (VANCOCIN) 1,750 mg in sodium chloride 0.9 % 500 mL IVPB        1,750 mg 250 mL/hr over 120 Minutes Intravenous NOW 06/19/11 0956 06/19/11 1434   06/13/11 1200   vancomycin (VANCOCIN) 50 mg/mL oral solution 500 mg  Status:  Discontinued        500 mg Per Tube 4 times per day 06/13/11 1045 06/13/11 1600   06/13/11 1200   metroNIDAZOLE (FLAGYL) IVPB 500 mg  Status:  Discontinued        500 mg 100 mL/hr over 60 Minutes Intravenous Every 8 hours 06/13/11 1045 06/13/11 1600   06/12/11 1000   Ribavirin SOLN 400 mg  Status:  Discontinued     Comments: Special order item.       400 mg Per Tube 3 times daily 06/11/11 1346 06/18/11 1553   06/11/11 1600   ribavirin (REBETOL) capsule 400 mg  Status:  Discontinued        400 mg Oral 3 times daily 06/11/11 1239 06/11/11 1343   06/08/11 2200   ceFEPIme (MAXIPIME) 2 g in dextrose 5 % 50 mL IVPB     Comments: Use Cefepime 1 g IV q12h for CrCl< 60 mL/min      2 g 100 mL/hr over 30 Minutes Intravenous Every 12 hours 06/08/11 1536 06/11/11 2320  06/08/11 0800   piperacillin-tazobactam (ZOSYN) IVPB 3.375 g  Status:  Discontinued        3.375 g 12.5 mL/hr over 240 Minutes Intravenous Every 8 hours 06/08/11 0149 06/08/11 1155   06/08/11 0600   micafungin (MYCAMINE) 100 mg in sodium chloride 0.9 % 100 mL IVPB  Status:  Discontinued        100 mg 100 mL/hr over 1 Hours Intravenous Daily 06/08/11 0308 06/10/11 1703   06/08/11 0200  piperacillin-tazobactam (ZOSYN) IVPB 3.375 g       3.375 g 100 mL/hr over 30 Minutes Intravenous  Once 06/08/11 0149 06/08/11 0254   06/06/11 0600   vancomycin (VANCOCIN) 1,750 mg in sodium chloride 0.9 % 500 mL IVPB  Status:  Discontinued        1,750 mg 250 mL/hr over 120 Minutes Intravenous Every 24 hours 06/05/11 0906 06/08/11 0403   06/05/11 2200   levofloxacin (LEVAQUIN) IVPB 750 mg  Status:  Discontinued     Comments: Use Levaquin 750 mg IV q48h for CrCl < 74mL/min      750  mg 100 mL/hr over 90 Minutes Intravenous Every 48 hours 06/04/11 0344 06/05/11 0908   06/05/11 1000   levofloxacin (LEVAQUIN) IVPB 750 mg  Status:  Discontinued     Comments: Use Levaquin 750 mg IV q48h for CrCl < 33mL/min      750 mg 100 mL/hr over 90 Minutes Intravenous Every 24 hours 06/05/11 0908 06/06/11 0908   06/04/11 1200   sulfamethoxazole-trimethoprim (BACTRIM) 664 mg in dextrose 5 % 500 mL IVPB  Status:  Discontinued        20 mg/kg/day  132.7 kg 361 mL/hr over 90 Minutes Intravenous 4 times per day 06/04/11 1039 06/06/11 0908   06/04/11 1100   micafungin (MYCAMINE) 100 mg in sodium chloride 0.9 % 100 mL IVPB  Status:  Discontinued        100 mg 100 mL/hr over 1 Hours Intravenous Daily 06/04/11 1030 06/06/11 0908   06/04/11 0600   ceFEPIme (MAXIPIME) 1 g in dextrose 5 % 50 mL IVPB  Status:  Discontinued     Comments: Use Cefepime 1 g IV q12h for CrCl< 60 mL/min      1 g 100 mL/hr over 30 Minutes Intravenous Every 12 hours 06/04/11 0344 06/08/11 1536   06/04/11 0600   vancomycin (VANCOCIN) 1,500 mg in sodium chloride 0.9 % 500 mL IVPB  Status:  Discontinued        1,500 mg 250 mL/hr over 120 Minutes Intravenous Every 24 hours 06/04/11 0425 06/05/11 0906   06/04/11 0430   levofloxacin (LEVAQUIN) IVPB 750 mg        750 mg 100 mL/hr over 90 Minutes Intravenous  Once 06/04/11 0354 06/04/11 0632   06/03/11 2330   vancomycin (VANCOCIN) IVPB 1000 mg/200 mL premix        1,000 mg 200 mL/hr over 60 Minutes Intravenous  Once 06/03/11 2326 06/04/11 0051   06/03/11 2330  piperacillin-tazobactam (ZOSYN) IVPB 3.375 g       3.375 g 12.5 mL/hr over 240 Minutes Intravenous  Once 06/03/11 2326 06/04/11 0521          Assessment: 56 yo M admitted with SOB, cough, fever x 3 days to ED 5/7.  Is immunosuppressed (h/o renal transplant 1996).  Is s/p ribavirin/IVIG for parainfluenza pna/?cardiomyopathy.  Been on many antibiotics during his hospital stay.  Now on day 4 Vancomycin for LE  cellulitis.  Patient has been on CRRT  since 5/12, now stopped since this morning ~8 AM.  Plan is to do intermittent HD as needed.  Patient is afebrile and WBC remains elevated (17.3, stable).    5/7 Vanc >> 5/14 5/7 Levaquin > 5/10 5/7 Mycamine > 5/10, 5/12>>5/14 5/7 Bactrim > 5/10 5/8 Cefepime 5/8 >>5/16 5/11 Zosyn >> 5/12  5/16 Ribavirin >> 5/23 5/23 Vanc >>  5/16 IVIG QOD >> 5/23  5/7 Urine: insignificant 5/7 Blood: negative 5/8 BAL: neg (AFB, fungal smear, PCP) 5/8 HSV PCR: neg 5/8 Blood: neg 5/8 Resp virus panel: Parainfluenza 3 positive 5/12: BC x 2: Negative 5/17: Cdiff: Negative 5/23 Blood: NGTD 5/23: Cdiff: Neg  Goal of Therapy:  Vancomycin trough level 10-15 mcg/ml  Plan:  1.  D/C current vancomycin dosing 2.  F/up HD schedule and depending on when HD is may need level to see if has cleared vancomycin 3.  F/up cultures  Rolland Porter, Pharm.D., BCPS Clinical Pharmacist Pager: [phone] 06/22/2011,10:49 AM

## 2011-06-22 NOTE — Progress Notes (Signed)
Subjective: I/O net negative 3.7 L yesterday, alert, wants to get up in chair.   Objective Vital signs in last 24 hours: Filed Vitals:   06/22/11 0401 06/22/11 0500 06/22/11 0600 06/22/11 0700  BP: 89/63 89/65 88/69  90/58  Pulse: 97 114 113 84  Temp: 98.3 F (36.8 C)     TempSrc: Oral     Resp: 21 21  16   Height:      Weight:      SpO2: 100% 99% 100% 100%   Weight change: -4.5 kg (-9 lb 14.7 oz)  Intake/Output Summary (Last 24 hours) at 06/22/11 0807 Last data filed at 06/22/11 0700  Gross per 24 hour  Intake 1963.4 ml  Output   5512 ml  Net -3548.6 ml   Physical Exam:  Blood pressure 112/74, pulse 70, temperature 97.3 F (36.3 C), temperature source Oral, resp. rate 22, height 5\' 11"  (1.803 m), weight 109.3 kg (240 lb 15.4 oz), SpO2 100.00%.  06/20/11 0500 109.3 kg (240 lb 15.4 oz) -- -- [facility]  06/19/11 0500 114.7 kg (252 lb 13.9 oz) -- -- [facility]  06/18/11 0500 120.2 kg (264 lb 15.9 oz) -- -- [facility]  06/17/11 0500 124.4 kg (274 lb 4 oz) -- -- [facility]  06/16/11 0600 127.3 kg (280 lb 10.3 oz) -- -- [facility]  06/15/11 0400 130.3 kg (287 lb 4.2 oz) -- -- [facility]  06/14/11 0400 ! 136.4 kg (300 lb 11.3 oz) -- -- [facility]  06/13/11 0500 ! 139.3 kg (307 lb 1.6 oz) -- -- [facility]  06/12/11 0500 ! 142.2 kg (313 lb 7  Awake and alert.  Trach collar in place, no distress.  Coarse BS No pericardial rub  Ht sds somewhat distant Abdomen without focal tenderness; allograft not tender Exts with improved edema, 1-2+ pedal, no pretibial Right ij tem HD cath and R SCV TDC in place  Labs: Basic Metabolic Panel:  Lab 06/22/11 1610 06/21/11 0430 06/20/11 1500 06/20/11 0500 06/19/11 1650 06/19/11 0435 06/18/11 1545  NA 134* 138 135 136 134* 135 135  K 4.0 3.6 4.2 3.4* 4.2 4.0 4.0  CL 99 102 101 101 100 99 99  CO2 25 25 24 26 26 26 26   GLUCOSE 97 92 132* 104* 118* 96 112*  BUN 30* 34* 34* 34* 37* 39* 39*  CREATININE 1.29 1.45* 1.57* 1.45* 1.57* 1.58* 1.65*  ALB -- -- -- -- -- -- --  CALCIUM 8.5 8.5 8.4 8.3* 8.4 8.3* 8.1*    PHOS 2.3 2.6 3.8 3.0 3.1 2.1* 3.7   Liver Function Tests:  Lab 06/22/11 0411 06/21/11 0430 06/20/11 1500 06/17/11 0425  AST 154* -- -- 126*  ALT 61* -- -- 52  ALKPHOS 259* -- -- 93  BILITOT 2.2* -- -- 2.0*  PROT 8.9* -- -- 7.8  ALBUMIN 2.2*2.3* 2.1* 2.0* --   No results found for this basename: LIPASE:3,AMYLASE:3 in the last 168 hours No results found for this basename: AMMONIA:3 in the last 168 hours CBC:  Lab 06/22/11 0411 06/21/11 0430 06/20/11 0500 06/18/11 1545 06/17/11 1349 06/17/11 0425  WBC 17.3* 17.7* 23.5* 23.6* -- --  NEUTROABS -- -- -- 18.9* 13.2* 13.4*  HGB 9.2* 8.5* 8.4* 7.9* -- --  HCT 32.4* 29.7* 29.0* 27.1* -- --  MCV 112.5* 109.2* 107.8* 105.0* -- --  PLT 92* 86* 95* 99* -- --  Cardiac Enzymes: No results found for this basename: CKTOTAL:5,CKMB:5,CKMBINDEX:5,TROPONINI:5 in the last 168 hours CBG:  Lab 06/18/11 1120 06/18/11 0720 06/18/11 0347 06/17/11 2340 06/17/11  1941  GLUCAP 114* 100* 103* 113* 117*   Dg Chest Port 1 View  06/20/2011  *RADIOLOGY REPORT*  Clinical Data: Cough.  Pneumonia.  PORTABLE CHEST - 1 VIEW  Comparison: 06/18/2011  Findings: 0529 hours.  Lung volumes are low. The cardiopericardial silhouette is enlarged.  Tracheostomy tube again noted. A feeding tube passes into the stomach although the distal tip position is not included on the film.  Right IJ central line tip projects at the distal SVC level. Telemetry leads overlie the chest.  IMPRESSION: Stable.  Cardiomegaly with low lung volumes and mild vascular congestion.  Original Report Authenticated By: ERIC A. MANSELL, M.D.   Medications: . sodium chloride 10 mL/hr at 06/16/11 0459  . amiodarone (NEXTERONE PREMIX) 360 mg/200 mL dextrose 0.5 mg/min (06/19/11 2117)  . dialysis replacement fluid (prismasate) 200 mL/hr at 06/19/11 0833  . dialysis replacement fluid (prismasate) 500 mL/hr at 06/20/11 0448  . dialysate (PRISMASATE) 2,000 mL/hr at 06/20/11 0411   . antiseptic oral rinse  15  mL Mouth Rinse QID  . chlorhexidine  15 mL Mouth Rinse BID  . collagenase   Topical Daily  . darbepoetin (ARANESP) injection - DIALYSIS  100 mcg Intravenous Q Wed-HD  . feeding supplement (OXEPA)  1,000 mL Per Tube Q24H  . feeding supplement  60 mL Per Tube QID  . ferric gluconate (FERRLECIT/NULECIT) IV  125 mg Intravenous Daily  . mulitivitamin  5 mL Per Tube Daily  . pantoprazole sodium  40 mg Per Tube BID  . potassium chloride  40 mEq Oral Daily  . predniSONE  10 mg Per Tube Q breakfast  . sirolimus  0.75 mg Oral Daily  . sodium phosphate  Dextrose 5% IVPB  30 mmol Intravenous Once (last dose 5/23)  . vancomycin  1,250 mg Intravenous Q24H  . vancomycin  1,750 mg Intravenous NOW  . DISCONTD: hydrocortisone sod succinate (SOLU-CORTEF) injection  25 mg Intravenous Daily    I  have reviewed scheduled and prn medications.  Assessment/Recs  Pt is a 56 y.o. 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 PNA/parainfluenza/cardiomyopathy/hypotension- completed RX for paraflu. Has trach and requiring vent support. Slow imp in CXR BP stable but soft no pressors 2. AKI, oliguric, HD dependent with CRRT 5/13- Will stop CRRT today.  Has had excellent volume removal and has some mild LE edema left, but not a lot.  No pulm edema. Using new R SCV tunneled HD cath now, and temporary HD cath has been removed. Do intermittent HD as needed.  3. 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 He is oliguric. Blood sirolimus level on 5/8 at 5 pm was 2.0 (3-18), but that was day of admission and we  don't know when his last outpatient dose was.  Is not getting diltiazem as at home which might cause lower levels. Will repeat trough level today, getting 0.75 gm / NG daily as at home.  4. Anemia- hgb stable. Aranesp. Iron. 5. Secondary hyperparathyroidism- no treatment right now, rocaltrol stopped - not essential at this time and calcium up 6. PNA parainfluenza- finished course   Vinson Moselle  MD [facility] [phone] pgr    [phone] cell 06/22/2011, 8:07 AM

## 2011-06-23 LAB — RENAL FUNCTION PANEL
Albumin: 2.1 g/dL — ABNORMAL LOW (ref 3.5–5.2)
BUN: 60 mg/dL — ABNORMAL HIGH (ref 6–23)
Calcium: 8.7 mg/dL (ref 8.4–10.5)
Creatinine, Ser: 2.89 mg/dL — ABNORMAL HIGH (ref 0.50–1.35)
GFR calc non Af Amer: 23 mL/min — ABNORMAL LOW (ref >90)
Phosphorus: 5.6 mg/dL — ABNORMAL HIGH (ref 2.3–4.6)

## 2011-06-23 LAB — CBC
HCT: 30.8 % — ABNORMAL LOW (ref 39.0–52.0)
Hemoglobin: 8.8 g/dL — ABNORMAL LOW (ref 13.0–17.0)
MCH: 32.2 pg (ref 26.0–34.0)
MCHC: 28.6 g/dL — ABNORMAL LOW (ref 30.0–36.0)
MCV: 112.8 fL — ABNORMAL HIGH (ref 78.0–100.0)
RBC: 2.73 MIL/uL — ABNORMAL LOW (ref 4.22–5.81)

## 2011-06-23 MED ORDER — OSMOLITE 1.5 CAL PO LIQD
1000.0000 mL | ORAL | Status: DC
  Administered 2011-06-23: 1000 mL
  Filled 2011-06-23 (×4): qty 1000

## 2011-06-23 MED ORDER — PRO-STAT SUGAR FREE PO LIQD
30.0000 mL | Freq: Every day | ORAL | Status: DC
  Administered 2011-06-24: 30 mL
  Filled 2011-06-23: qty 30

## 2011-06-23 NOTE — Evaluation (Signed)
Occupational Therapy Evaluation Patient Details Name: Chad [name] MRN: [id number] DOB: [birth year] Today's Date: 06/23/2011 Time: 1017-1100 OT Time Calculation (min): 43 min  OT Assessment / Plan / Recommendation Clinical Impression  Pt is a 56 yo male who presents with influenza a pneumonia. Currently on trach collar at 30% and vent. Pt with limited activity, standing tolerance. Skilled OT recommended to maximize I w/BADLs to min-mod A level in prep for d/c to next venue of care.    OT Assessment  Patient needs continued OT Services    Follow Up Recommendations  Inpatient Rehab    Barriers to Discharge Inaccessible home environment;Decreased caregiver support    Equipment Recommendations  Defer to next venue    Recommendations for Other Services Rehab consult  Frequency  Min 2X/week    Precautions / Restrictions Precautions Precautions: Fall Precaution Comments: trach collar at 35% Restrictions Weight Bearing Restrictions: No   Pertinent Vitals/Pain Pt currently with trach collar at 35%    ADL  Grooming: Performed;Wash/dry face;Set up Where Assessed - Grooming: Unsupported sitting Upper Body Bathing: Simulated;Minimal assistance Where Assessed - Upper Body Bathing: Unsupported sitting Lower Body Bathing: Simulated;+2 Total assistance Lower Body Bathing: Patient Percentage: 50% Where Assessed - Lower Body Bathing: Supported sit to stand Upper Body Dressing: Simulated;Set up Where Assessed - Upper Body Dressing: Unsupported standing Lower Body Dressing: Performed;+1 Total assistance Where Assessed - Lower Body Dressing: Unsupported sitting Toilet Transfer: Simulated;+2 Total assistance Toilet Transfer: Patient Percentage: 50% Toilet Transfer Method: Sit to stand;Stand pivot Toileting - Clothing Manipulation and Hygiene: Performed;+2 Total assistance Toileting - Clothing Manipulation and Hygiene: Patient Percentage: 0% Where Assessed - Glass blower/designer Manipulation  and Hygiene: Standing Equipment Used: Rolling walker ADL Comments: Pt very weak and deconditioned, fatigued quickly. HR increased to 136 with activity. Pt also with indwelling rectal tube which patient says caused him great discomfort.    OT Diagnosis: Generalized weakness  OT Problem List: Decreased strength;Decreased activity tolerance;Decreased safety awareness;Cardiopulmonary status limiting activity;Decreased knowledge of use of DME or AE;Impaired balance (sitting and/or standing) OT Treatment Interventions: Self-care/ADL training;Therapeutic activities;Therapeutic exercise;Energy conservation;DME and/or AE instruction;Patient/family education;Balance training   OT Goals Acute Rehab OT Goals OT Goal Formulation: With patient Time For Goal Achievement: 06/30/11 Potential to Achieve Goals: Good ADL Goals Pt Will Perform Grooming: Standing at sink;Other (comment) (w/minguard A X 3 tasks to improve standing activty tolerance) ADL Goal: Grooming - Progress: Goal set today Pt Will Perform Lower Body Bathing: with min assist;Sit to stand from chair;Sit to stand from bed ADL Goal: Lower Body Bathing - Progress: Goal set today Pt Will Perform Lower Body Dressing: with mod assist;Sit to stand from chair;Sit to stand from bed ADL Goal: Lower Body Dressing - Progress: Goal set today Pt Will Transfer to Toilet: with min assist;3-in-1;Stand pivot transfer;Ambulation ADL Goal: Toilet Transfer - Progress: Goal set today Pt Will Perform Toileting - Clothing Manipulation: with mod assist;Standing ADL Goal: Toileting - Clothing Manipulation - Progress: Goal set today Pt Will Perform Toileting - Hygiene: with mod assist;Sit to stand from 3-in-1/toilet ADL Goal: Toileting - Hygiene - Progress: Goal set today Additional ADL Goal #1: Pt will verbalize and demo 3 energy conservation strategies for successful ADL completion. ADL Goal: Additional Goal #1 - Progress: Goal set today Arm Goals Pt Will Complete  Theraband Exer: with supervision, verbal cues required/provided;to increase strength;Bilateral upper extremities;2 sets;10 reps;Level 2 Theraband Arm Goal: Theraband Exercises - Progress: Goal set today  Visit Information  Last OT Received On: 06/23/11 Assistance Needed: +  2 PT/OT Co-Evaluation/Treatment: Yes    Subjective Data  Subjective: Pt unable to make verbalizations. But is able to mouth answers to simple questions. Patient Stated Goal: Not asked.   Prior Functioning  Home Living Lives With: Alone Available Help at Discharge: Family;Friend(s);Available 24 hours/day Type of Home: House Home Access: Stairs to enter Entrance Stairs-Rails: Left;Right Home Layout: Two level;Able to live on main level with bedroom/bathroom Bathroom Shower/Tub: Tub/shower unit;Walk-in shower;Door;Curtain Bathroom Toilet: Handicapped height Home Adaptive Equipment: Walker - rolling;Straight cane Prior Function Level of Independence: Independent Able to Take Stairs?: Yes Driving: Yes Vocation: Full time employment Communication Communication: Expressive difficulties;Tracheostomy    Cognition  Overall Cognitive Status: Appears within functional limits for tasks assessed/performed Arousal/Alertness: Awake/alert Orientation Level: Appears intact for tasks assessed Behavior During Session: [facility] for tasks performed    Extremity/Trunk Assessment Right Upper Extremity Assessment RUE ROM/Strength/Tone: [facility] for tasks assessed Left Upper Extremity Assessment LUE ROM/Strength/Tone: WFL for tasks assessed Right Lower Extremity Assessment RLE ROM/Strength/Tone: Deficits RLE ROM/Strength/Tone Deficits: grossly 3+/5 Left Lower Extremity Assessment LLE ROM/Strength/Tone: Deficits LLE ROM/Strength/Tone Deficits: grossly 3+/5 Trunk Assessment Trunk Assessment: Other exceptions (weak core strength)   Mobility Bed Mobility Bed Mobility: Supine to Sit Supine to Sit: 3: Mod assist;With rails;HOB  elevated Sitting - Scoot to Edge of Bed: 5: Supervision Details for Bed Mobility Assistance: cues for hand placement, technique. Physical A needed for trunk. Transfers Transfers: Sit to Stand;Stand to Sit Sit to Stand: 1: +2 Total assist;With upper extremity assist;From bed;From elevated surface Sit to Stand: Patient Percentage: 50% Stand to Sit: 1: +2 Total assist;With upper extremity assist;With armrests;To chair/3-in-1 (Pt with uncontrolled descent to chair.) Stand to Sit: Patient Percentage: 50% Details for Transfer Assistance: Attempted x 4. Upon 1st attempt, pts knees buckled and he had to sit. Cues for hand placement, technique. Pt demo's poor standing tolerance.   Exercise    Balance Balance Balance Assessed: Yes Static Sitting Balance Static Sitting - Balance Support: No upper extremity supported;Feet supported Static Sitting - Level of Assistance: 5: Stand by assistance Static Sitting - Comment/# of Minutes: 20  End of Session OT - End of Session Activity Tolerance: Patient limited by fatigue Patient left: in chair;with call bell/phone within reach Nurse Communication: Mobility status   Landis Cassaro A OTR/L [phone] 06/23/2011, 11:20 AM

## 2011-06-23 NOTE — Progress Notes (Signed)
Nutrition Follow-up  Trach 5/21. Tunneled catheter inserted 5/24.   CRRT 5/13 - 5/26. Pt now planned to receive IHD as needed.  Overall weight has decreased from 142 kg - 106 kg 2/2 fluid removal with CRRT.  Tolerating PMSV trials well. SLP recommends swallow evaluation.  Diet Order:  NPO TF (managed by RD): Oxepa at 30 ml/h with Prostat 60 ml QID to provide 1880 kcals, 165 grams protein, 568 ml free water daily  Meds: Scheduled Meds:   . antiseptic oral rinse  15 mL Mouth Rinse QID  . chlorhexidine  15 mL Mouth Rinse BID  . collagenase   Topical Daily  . darbepoetin (ARANESP) injection - DIALYSIS  100 mcg Intravenous Q Wed-HD  . feeding supplement (OXEPA)  1,000 mL Per Tube Q24H  . feeding supplement  60 mL Per Tube QID  . ferric gluconate (FERRLECIT/NULECIT) IV  125 mg Intravenous Daily  . mulitivitamin  5 mL Per Tube Daily  . ondansetron      . pantoprazole sodium  40 mg Per Tube BID  . potassium chloride  40 mEq Oral Daily  . predniSONE  10 mg Per Tube Q breakfast  . sirolimus  0.75 mg Oral Daily  . white petrolatum       Continuous Infusions:   . sodium chloride 10 mL/hr at 06/23/11 0043  . amiodarone (NEXTERONE PREMIX) 360 mg/200 mL dextrose 0.5 mg/min (06/23/11 0138)   PRN Meds:.albuterol, fentaNYL, metoprolol, ondansetron (ZOFRAN) IV, DISCONTD:  HYDROmorphone (DILAUDID) injection, DISCONTD: midazolam  Labs:  CMP     Component Value Date/Time   NA 140 06/23/2011 0429   K 4.8 06/23/2011 0429   CL 104 06/23/2011 0429   CO2 24 06/23/2011 0429   GLUCOSE 95 06/23/2011 0429   BUN 60* 06/23/2011 0429   CREATININE 2.89* 06/23/2011 0429   CALCIUM 8.7 06/23/2011 0429   PROT 8.9* 06/22/2011 0411   ALBUMIN 2.1* 06/23/2011 0429   AST 154* 06/22/2011 0411   ALT 61* 06/22/2011 0411   ALKPHOS 259* 06/22/2011 0411   BILITOT 2.2* 06/22/2011 0411   GFRNONAA 23* 06/23/2011 0429   GFRAA 27* 06/23/2011 0429   Recent Labs  Basename 06/23/11 0429 06/22/11 0411 06/21/11 0430   PHOS 5.6* 2.3  2.6   Recent Labs  Basename 06/22/11 0411 06/21/11 0430   MG 2.5 2.4    Intake/Output Summary (Last 24 hours) at 06/23/11 1058 Last data filed at 06/23/11 1000  Gross per 24 hour  Intake 1299.4 ml  Output    665 ml  Net  634.4 ml   Weight Status:  106 kg  Body mass index is 32.59 kg/(m^2). Pt meets criteria for Obesity, Class I.  Re-estimated needs:  2075 kcals, 99 - 108 grams protein; noted; if pt to become HD-dependent will need 115 - 130 grams protein daily  Nutrition Dx:  Inadequate oral intake - ongoing.  Previous Goal:  Enteral nutrition to provide 60-70% of estimated calorie needs (22-25 kcals/kg ideal body weight) and 100% of estimated protein needs, based on ASPEN guidelines for permissive underfeeding in critically ill obese individuals. No longer applicable.  New Goal: TF to meet at least 90% of estimated needs.  Intervention:   1. To better meet nutrition needs, will change formula to Osmolite 1.5 at 50 ml/hr with 30 ml Prostat via tube daily. This will provide: 2000 kcal, 106 grams protein, 914 ml free water. 2. Discontinue liquid MVI as this regimen will meet DRIs 2. RD to continue to follow nutrition care  plan and make recommendations  Monitor: renal function status and HD, weights, labs, I/O's, TF tolerance/adequacy, swallow eval  Adair Laundry Pager #:  [phone]

## 2011-06-23 NOTE — Progress Notes (Signed)
Chad [name] ROUNDING NOTE   Subjective:   Interval History: none.  Objective:  Vital signs in last 24 hours:  Temp:  [97.7 F (36.5 C)-99.6 F (37.6 C)] 98.6 F (37 C) (05/27 0821) Pulse Rate:  [62-118] 103  (05/27 0821) Resp:  [17-30] 25  (05/27 0821) BP: (84-123)/(50-76) 123/62 mmHg (05/27 0821) SpO2:  [95 %-100 %] 100 % (05/27 0821) FiO2 (%):  [30 %-35 %] 35 % (05/27 0821) Weight:  [106 kg (233 lb 11 oz)] 106 kg (233 lb 11 oz) (05/27 0500)  Weight change: 3.2 kg (7 lb 0.9 oz) Filed Weights   06/21/11 0500 06/22/11 0400 06/23/11 0500  Weight: 107.3 kg (236 lb 8.9 oz) 102.8 kg (226 lb 10.1 oz) 106 kg (233 lb 11 oz)    Intake/Output: I/O last 3 completed shifts: In: 2353.1 [I.V.:1091.1; NG/GT:1150; IV Piggyback:112] Out: 4116 [Urine:155; ZOXWR:6045; Stool:1200]   Intake/Output this shift:     Awake and alert. Trach collar in place, no distress.  RS  Coarse BS but clear  CVS No pericardial rub Ht sds somewhat distant  RRR Abdomen without focal tenderness; allograft not tender  Exts with improved edema, 1-2+ pedal, no pretibial  Right ij tem HD cath and R SCV TDC in place    Basic Metabolic Panel:  Lab 06/23/11 4098 06/22/11 0411 06/21/11 0430 06/20/11 1500 06/20/11 0500 06/19/11 0435 06/18/11 0522  NA 140 134* 138 135 136 -- --  K 4.8 4.0 3.6 4.2 3.4* -- --  CL 104 99 102 101 101 -- --  CO2 24 25 25 24 26  -- --  GLUCOSE 95 97 92 132* 104* -- --  BUN 60* 30* 34* 34* 34* -- --  CREATININE 2.89* 1.29 1.45* 1.57* 1.45* -- --  CALCIUM 8.7 8.5 8.5 -- -- -- --  MG -- 2.5 2.4 -- 2.4 2.2 2.2  PHOS 5.6* 2.3 2.6 3.8 3.0 -- --    Liver Function Tests:  Lab 06/23/11 0429 06/22/11 0411 06/21/11 0430 06/20/11 1500 06/20/11 0500 06/17/11 0425  AST -- 154* -- -- -- 126*  ALT -- 61* -- -- -- 52  ALKPHOS -- 259* -- -- -- 93  BILITOT -- 2.2* -- -- -- 2.0*  PROT -- 8.9* -- -- -- 7.8  ALBUMIN 2.1* 2.2*2.3* 2.1* 2.0* 2.1* --   No results found for this  basename: LIPASE:5,AMYLASE:5 in the last 168 hours No results found for this basename: AMMONIA:3 in the last 168 hours  CBC:  Lab 06/23/11 0429 06/22/11 0411 06/21/11 0430 06/20/11 0500 06/18/11 1545 06/17/11 1349 06/17/11 0425  WBC 19.7* 17.3* 17.7* 23.5* 23.6* -- --  NEUTROABS -- -- -- -- 18.9* 13.2* 13.4*  HGB 8.8* 9.2* 8.5* 8.4* 7.9* -- --  HCT 30.8* 32.4* 29.7* 29.0* 27.1* -- --  MCV 112.8* 112.5* 109.2* 107.8* 105.0* -- --  PLT 85* 92* 86* 95* 99* -- --    Cardiac Enzymes: No results found for this basename: CKTOTAL:5,CKMB:5,CKMBINDEX:5,TROPONINI:5 in the last 168 hours  BNP: No components found with this basename: POCBNP:5  CBG:  Lab 06/18/11 1120 06/18/11 0720 06/18/11 0347 06/17/11 2340 06/17/11 1941  GLUCAP 114* 100* 103* 113* 117*    Microbiology: Results for orders placed during the hospital encounter of 06/03/11  URINE CULTURE     Status: Normal   Collection Time   06/03/11 10:53 PM      Component Value Range Status Comment   Specimen Description URINE, RANDOM   Final    Special Requests ADDED  [id number]   Final    Culture  Setup Time [id number]   Final    Colony Count >=100,000 COLONIES/ML   Final    Culture     Final    Value: Multiple bacterial morphotypes present, none predominant. Suggest appropriate recollection if clinically indicated.   Report Status 06/05/2011 FINAL   Final   CULTURE, BLOOD (ROUTINE X 2)     Status: Normal   Collection Time   06/03/11 11:45 PM      Component Value Range Status Comment   Specimen Description BLOOD RIGHT ARM   Final    Special Requests BOTTLES DRAWN AEROBIC AND ANAEROBIC 10CC   Final    Culture  Setup Time [id number]   Final    Culture NO GROWTH 5 DAYS   Final    Report Status 06/10/2011 FINAL   Final   CULTURE, BLOOD (ROUTINE X 2)     Status: Normal   Collection Time   06/03/11 11:50 PM      Component Value Range Status Comment   Specimen Description BLOOD RIGHT ARM   Final    Special Requests     Final     Value: BOTTLES DRAWN AEROBIC AND ANAEROBIC 5CC AEROBOC 3CC ANAEROBIC   Culture  Setup Time [id number]   Final    Culture NO GROWTH 5 DAYS   Final    Report Status 06/10/2011 FINAL   Final   MRSA PCR SCREENING     Status: Normal   Collection Time   06/04/11  3:08 AM      Component Value Range Status Comment   MRSA by PCR NEGATIVE  NEGATIVE  Final   RESPIRATORY VIRUS PANEL (18 COMPONENTS)     Status: Abnormal   Collection Time   06/04/11  6:09 AM      Component Value Range Status Comment   Source - RVPAN NASAL MUCOSA   Final    Respiratory Syncytial Virus A NOT DETECTED   Final    Respiratory Syncytial Virus B NOT DETECTED   Final    Influenza A NOT DETECTED   Final    Influenza B NOT DETECTED   Final    Parainfluenza 1 NOT DETECTED   Final    Parainfluenza 2 NOT DETECTED   Final    Parainfluenza 3 DETECTED (*)  Final    Parainfluenza 4 NOT DETECTED   Final    Metapneumovirus NOT DETECTED   Final    Coxsackie and Echovirus NOT DETECTED   Final    Rhinovirus NOT DETECTED   Final    Adenovirus B NOT DETECTED   Final    Adenovirus E NOT DETECTED   Final    CoronavirusNL63 NOT DETECTED   Final    CoronavirusHKU1 NOT DETECTED   Final    Coronavirus229E NOT DETECTED   Final    CoronavirusOC43 NOT DETECTED   Final   CULTURE, EXPECTORATED SPUTUM-ASSESSMENT     Status: Normal   Collection Time   06/04/11  7:48 AM      Component Value Range Status Comment   Specimen Description SPUTUM   Final    Special Requests NONE   Final    Sputum evaluation     Final    Value: MICROSCOPIC FINDINGS SUGGEST THAT THIS SPECIMEN IS NOT REPRESENTATIVE OF LOWER RESPIRATORY SECRETIONS. PLEASE RECOLLECT.     CALLED TO D ORTIZ,RN 06/04/11 0911 BY K SCHULTZ   Report Status 06/04/2011 FINAL   Final   AFB CULTURE  WITH SMEAR     Status: Normal (Preliminary result)   Collection Time   06/04/11 10:30 AM      Component Value Range Status Comment   Specimen Description BRONCHIAL WASHINGS   Final    Special Requests NONE    Final    ACID FAST SMEAR NO ACID FAST BACILLI SEEN   Final    Culture     Final    Value: CULTURE WILL BE EXAMINED FOR 6 WEEKS BEFORE ISSUING A FINAL REPORT   Report Status PENDING   Incomplete   PNEUMOCYSTIS JIROVECI SMEAR BY DFA     Status: Normal   Collection Time   06/04/11 10:30 AM      Component Value Range Status Comment   Specimen Source-PJSRC BRONCHIAL WASHINGS   Final    Pneumocystis jiroveci Ag NEGATIVE   Final Performed at [facility]  FUNGUS CULTURE W SMEAR     Status: Normal (Preliminary result)   Collection Time   06/04/11 10:30 AM      Component Value Range Status Comment   Specimen Description BRONCHIAL WASHINGS   Final    Special Requests NONE   Final    Fungal Smear NO YEAST OR FUNGAL ELEMENTS SEEN   Final    Culture CULTURE IN PROGRESS FOR FOUR WEEKS   Final    Report Status PENDING   Incomplete   CULTURE, RESPIRATORY     Status: Normal   Collection Time   06/04/11 10:30 AM      Component Value Range Status Comment   Specimen Description BRONCHIAL WASHINGS   Final    Special Requests NONE   Final    Gram Stain     Final    Value: FEW WBC PRESENT,BOTH PMN AND MONONUCLEAR     NO SQUAMOUS EPITHELIAL CELLS SEEN     NO ORGANISMS SEEN   Culture NO GROWTH 2 DAYS   Final    Report Status 06/07/2011 FINAL   Final   HSV PCR     Status: Normal   Collection Time   06/04/11 10:30 AM      Component Value Range Status Comment   HSV, PCR Not Detected  Not Detected  Final    HSV 2 , PCR Not Detected  Not Detected  Final    Specimen Source-HSVPCR NO GROWTH   Final   CULTURE, BLOOD (ROUTINE X 2)     Status: Normal   Collection Time   06/04/11  3:15 PM      Component Value Range Status Comment   Specimen Description BLOOD RIGHT HAND   Final    Special Requests BOTTLES DRAWN AEROBIC AND ANAEROBIC 10CC   Final    Culture  Setup Time [id number]   Final    Culture NO GROWTH 5 DAYS   Final    Report Status 06/11/2011 FINAL   Final   CULTURE, BLOOD (ROUTINE X 2)      Status: Normal   Collection Time   06/04/11  3:27 PM      Component Value Range Status Comment   Specimen Description BLOOD LEFT HAND   Final    Special Requests BOTTLES DRAWN AEROBIC ONLY 3CC   Final    Culture  Setup Time [id number]   Final    Culture NO GROWTH 5 DAYS   Final    Report Status 06/11/2011 FINAL   Final   CULTURE, BLOOD (ROUTINE X 2)     Status: Normal  Collection Time   06/08/11  4:15 AM      Component Value Range Status Comment   Specimen Description BLOOD LEFT HAND   Final    Special Requests BOTTLES DRAWN AEROBIC ONLY 10CC   Final    Culture  Setup Time [id number]   Final    Culture NO GROWTH 5 DAYS   Final    Report Status 06/14/2011 FINAL   Final   CULTURE, BLOOD (ROUTINE X 2)     Status: Normal   Collection Time   06/08/11  4:20 AM      Component Value Range Status Comment   Specimen Description BLOOD LEFT HAND   Final    Special Requests BOTTLES DRAWN AEROBIC ONLY 10CC   Final    Culture  Setup Time [id number]   Final    Culture NO GROWTH 5 DAYS   Final    Report Status 06/14/2011 FINAL   Final   RESPIRATORY VIRUS PANEL (18 COMPONENTS)     Status: Abnormal   Collection Time   06/11/11  8:33 PM      Component Value Range Status Comment   Source - RVPAN BRONCHIAL ALVEOLAR LAVAGE   Final    Respiratory Syncytial Virus A NOT DETECTED   Final    Respiratory Syncytial Virus B NOT DETECTED   Final    Influenza A NOT DETECTED   Final    Influenza B NOT DETECTED   Final    Parainfluenza 1 NOT DETECTED   Final    Parainfluenza 2 NOT DETECTED   Final    Parainfluenza 3 DETECTED (*)  Final    Parainfluenza 4 NOT DETECTED   Final    Metapneumovirus NOT DETECTED   Final    Coxsackie and Echovirus NOT DETECTED   Final    Rhinovirus NOT DETECTED   Final    Adenovirus B NOT DETECTED   Final    Adenovirus E NOT DETECTED   Final    CoronavirusNL63 NOT DETECTED   Final    CoronavirusHKU1 NOT DETECTED   Final    Coronavirus229E NOT DETECTED   Final     CoronavirusOC43 NOT DETECTED   Final   CLOSTRIDIUM DIFFICILE BY PCR     Status: Normal   Collection Time   06/13/11  1:14 PM      Component Value Range Status Comment   C difficile by pcr NEGATIVE  NEGATIVE  Final   CLOSTRIDIUM DIFFICILE BY PCR     Status: Normal   Collection Time   06/19/11 10:25 AM      Component Value Range Status Comment   C difficile by pcr NEGATIVE  NEGATIVE  Final   CULTURE, BLOOD (ROUTINE X 2)     Status: Normal (Preliminary result)   Collection Time   06/19/11 10:57 AM      Component Value Range Status Comment   Specimen Description BLOOD LEFT ARM   Final    Special Requests BOTTLES DRAWN AEROBIC AND ANAEROBIC 10CC   Final    Culture  Setup Time [id number]   Final    Culture     Final    Value:        BLOOD CULTURE RECEIVED NO GROWTH TO DATE CULTURE WILL BE HELD FOR 5 DAYS BEFORE ISSUING A FINAL NEGATIVE REPORT   Report Status PENDING   Incomplete   CULTURE, BLOOD (ROUTINE X 2)     Status: Normal (Preliminary result)   Collection Time   06/19/11 10:58 AM  Component Value Range Status Comment   Specimen Description BLOOD LEFT HAND   Final    Special Requests BOTTLES DRAWN AEROBIC AND ANAEROBIC 10CC   Final    Culture  Setup Time [id number]   Final    Culture     Final    Value:        BLOOD CULTURE RECEIVED NO GROWTH TO DATE CULTURE WILL BE HELD FOR 5 DAYS BEFORE ISSUING A FINAL NEGATIVE REPORT   Report Status PENDING   Incomplete     Coagulation Studies: No results found for this basename: LABPROT:5,INR:5 in the last 72 hours  Urinalysis: No results found for this basename: COLORURINE:2,APPERANCEUR:2,LABSPEC:2,PHURINE:2,GLUCOSEU:2,HGBUR:2,BILIRUBINUR:2,KETONESUR:2,PROTEINUR:2,UROBILINOGEN:2,NITRITE:2,LEUKOCYTESUR:2 in the last 72 hours    Imaging: Dg Chest Port 1 View  06/22/2011  *RADIOLOGY REPORT*  Clinical Data: Follow up pneumonia.  PORTABLE CHEST - 1 VIEW  Comparison: 06/20/2011  Findings: New right jugular dialysis catheter tip in the  lower SVC. Feeding tube extends into the abdomen.  There is a tracheostomy tube.  The patient has low lung volumes.  Densities along the medial left lung base could represent atelectasis or airspace disease.  No evidence for a pneumothorax. Left jugular central line tip in the SVC.  IMPRESSION: Placement of a right jugular tunneled dialysis catheter.  Catheter tip in the lower SVC.  Left basilar densities suggestive for atelectasis or focal airspace disease.  Original Report Authenticated By: Richarda Overlie, M.D.     Medications:      . sodium chloride 10 mL/hr at 06/23/11 0043  . amiodarone (NEXTERONE PREMIX) 360 mg/200 mL dextrose 0.5 mg/min (06/23/11 0138)      . antiseptic oral rinse  15 mL Mouth Rinse QID  . chlorhexidine  15 mL Mouth Rinse BID  . collagenase   Topical Daily  . darbepoetin (ARANESP) injection - DIALYSIS  100 mcg Intravenous Q Wed-HD  . feeding supplement (OXEPA)  1,000 mL Per Tube Q24H  . feeding supplement  60 mL Per Tube QID  . ferric gluconate (FERRLECIT/NULECIT) IV  125 mg Intravenous Daily  . mulitivitamin  5 mL Per Tube Daily  . ondansetron      . pantoprazole sodium  40 mg Per Tube BID  . potassium chloride  40 mEq Oral Daily  . predniSONE  10 mg Per Tube Q breakfast  . sirolimus  0.75 mg Oral Daily  . white petrolatum      . DISCONTD: vancomycin  1,250 mg Intravenous Q24H   albuterol, fentaNYL, metoprolol, ondansetron (ZOFRAN) IV, DISCONTD:  HYDROmorphone (DILAUDID) injection, DISCONTD: midazolam  Assessment/ Plan:  Pt is a 56 y.o. yo male s/p renal transplant who was admitted on 06/03/2011 with PNA pulm HTN, volume overload now with acute on chronic was dialysis 1. PNA/parainfluenza/cardiomyopathy/hypotension- completed RX for paraflu. Has trach and requiring vent support. Slow imp in CXR BP stable but soft no pressors 2. AKI, oliguric, HD dependent with CRRT 5/13- Will stop CRRT today. Has had excellent volume removal and has some mild LE edema left, but not  a lot. No pulm edema. Using new R SCV tunneled HD cath now, and temporary HD cath has been removed. Do intermittent HD as needed.  3. 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 He is oliguric. Blood sirolimus level  on 5/8 at 5 pm was 2.0  4. Anemia- hgb stable. Aranesp. Iron. 5. Secondary hyperparathyroidism- no treatment right now, rocaltrol stopped - not essential at this time and calcium up 6. PNA parainfluenza- finished course   LOS: 20 Chad [name] W @TODAY @8 :56 AM

## 2011-06-23 NOTE — Clinical Social Work Note (Signed)
CSW was asked by RN to meet with pt and pt's fiance, per request of pt and family. Pt is no longer intubated and sedated. Pt was alert and oriented.   CSW met with pt and fiance, Hoyle Sauer. They inquired about establishing Power of [place] and information on applying for disability. CSW explained that the hospital will only [employer] [place] and offered the packet that explains advanced directives. CSW also explained that to apply for disability, he would have to apply at the social security administration.   Covering CSW will provide pt with packet and will address any further questions regarding Advanced Directives and Healthcare Power of [place].   Dede Query, MSW, Theresia Majors [phone]

## 2011-06-23 NOTE — Evaluation (Signed)
Physical Therapy Evaluation Patient Details Name: Chad [name] MRN: [id number] DOB: 23-Oct-1955 Today's Date: 06/23/2011 Time: 1017-1100 PT Time Calculation (min): 43 min  PT Assessment / Plan / Recommendation Clinical Impression  pt is 56 y/o adm with PNA and parainfluenza.  Now is very weak and deconditioned with compromized respiratory status.  Recommend CIR consult and rehab stay    PT Assessment  Patient needs continued PT services    Follow Up Recommendations  Inpatient Rehab    Barriers to Discharge        lEquipment Recommendations  Defer to next venue    Recommendations for Other Services Rehab consult   Frequency Min 3X/week    Precautions / Restrictions Precautions Precautions: Fall Precaution Comments: trach collar at 35% Restrictions Weight Bearing Restrictions: No   Pertinent Vitals/Pain        Mobility  Bed Mobility Bed Mobility: Supine to Sit Supine to Sit: 3: Mod assist;With rails;HOB elevated Sitting - Scoot to Edge of Bed: 5: Supervision Details for Bed Mobility Assistance: cues for hand placement, technique. Physical A needed for trunk. Transfers Transfers: Sit to Stand;Stand to Sit;Stand Pivot Transfers Sit to Stand: 1: +2 Total assist;With upper extremity assist;From bed;From elevated surface Sit to Stand: Patient Percentage: 50% Stand to Sit: 1: +2 Total assist;With upper extremity assist;With armrests;To chair/3-in-1 (Pt with uncontrolled descent to chair.) Stand to Sit: Patient Percentage: 50% Stand Pivot Transfers: 1: +2 Total assist Stand Pivot Transfers: Patient Percentage: 50% Details for Transfer Assistance: Attempted x 4. Upon 1st attempt, pts knees buckled and he had to sit. Cues for hand placement, technique. Pt demo's poor standing tolerance. Ambulation/Gait Ambulation/Gait Assistance: Not tested (comment) Stairs: No Wheelchair Mobility Wheelchair Mobility: No    Exercises     PT Diagnosis: Difficulty walking;Generalized  weakness;Acute pain  PT Problem List: Decreased strength;Decreased activity tolerance;Decreased balance;Decreased mobility;Decreased knowledge of use of DME;Decreased knowledge of precautions;Cardiopulmonary status limiting activity;Pain PT Treatment Interventions: DME instruction;Gait training;Functional mobility training;Therapeutic activities;Balance training;Patient/family education   PT Goals Acute Rehab PT Goals PT Goal Formulation: With patient Time For Goal Achievement: 07/07/11 Potential to Achieve Goals: Good Pt will go Sit to Stand: with min assist PT Goal: Sit to Stand - Progress: Goal set today Pt will Transfer Bed to Chair/Chair to Bed: with min assist PT Transfer Goal: Bed to Chair/Chair to Bed - Progress: Goal set today Pt will Ambulate: with min assist;16 - 50 feet;with rolling walker PT Goal: Ambulate - Progress: Goal set today  Visit Information  Last PT Received On: 06/23/11 Assistance Needed: +2    Subjective Data  Subjective: I'm weak Patient Stated Goal: back home Independent   Prior Functioning  Home Living Lives With: Alone Available Help at Discharge: Family;Friend(s);Available 24 hours/day Type of Home: House Home Access: Stairs to enter Entrance Stairs-Rails: Left;Right Home Layout: Two level;Able to live on main level with bedroom/bathroom Bathroom Shower/Tub: Tub/shower unit;Walk-in shower;Door;Curtain Bathroom Toilet: Handicapped height Home Adaptive Equipment: Walker - rolling;Straight cane Prior Function Level of Independence: Independent Able to Take Stairs?: Yes Driving: Yes Vocation: Full time employment Communication Communication: Expressive difficulties;Tracheostomy    Cognition  Overall Cognitive Status: Appears within functional limits for tasks assessed/performed Arousal/Alertness: Awake/alert Orientation Level: Appears intact for tasks assessed Behavior During Session: Chad [name] for tasks performed    Extremity/Trunk Assessment  Right Upper Extremity Assessment RUE ROM/Strength/Tone: [name] for tasks assessed Left Upper Extremity Assessment LUE ROM/Strength/Tone: WFL for tasks assessed Right Lower Extremity Assessment RLE ROM/Strength/Tone: Deficits RLE ROM/Strength/Tone Deficits: grossly 3+/5 Left Lower  Extremity Assessment LLE ROM/Strength/Tone: Deficits LLE ROM/Strength/Tone Deficits: grossly 3+/5 Trunk Assessment Trunk Assessment: Other exceptions (weak core strength)   Balance Balance Balance Assessed: Yes Static Sitting Balance Static Sitting - Balance Support: No upper extremity supported;Feet supported Static Sitting - Level of Assistance: 5: Stand by assistance Static Sitting - Comment/# of Minutes: 20  End of Session PT - End of Session Activity Tolerance: Patient limited by fatigue;Patient tolerated treatment well Patient left: in chair;with call bell/phone within reach Nurse Communication: Mobility status   Ellanie Oppedisano, Anthonie Lotito 06/23/2011, 11:18 AM  06/23/2011  [name], PT [phone] (pager)

## 2011-06-23 NOTE — Progress Notes (Signed)
INFECTIOUS DISEASE PROGRESS NOTE  ID: Chad [name] is a 56 y.o. male with   Principal Problem:  *Septic shock Active Problems:  HCAP (healthcare-associated pneumonia)  Thrombocytopenia  Acute respiratory failure  ARDS (adult respiratory distress syndrome)  Renal transplant disorder  Acute systolic heart failure  Acute renal failure  Cardiomyopathy  Subjective: C/o back pain.   Abtx:  Anti-infectives     Start     Dose/Rate Route Frequency Ordered Stop   06/20/11 1000   vancomycin (VANCOCIN) 1,250 mg in sodium chloride 0.9 % 250 mL IVPB  Status:  Discontinued        1,250 mg 166.7 mL/hr over 90 Minutes Intravenous Every 24 hours 06/19/11 0957 06/22/11 1058   06/19/11 1000   vancomycin (VANCOCIN) 1,750 mg in sodium chloride 0.9 % 500 mL IVPB        1,750 mg 250 mL/hr over 120 Minutes Intravenous NOW 06/19/11 0956 06/19/11 1434   06/13/11 1200   vancomycin (VANCOCIN) 50 mg/mL oral solution 500 mg  Status:  Discontinued        500 mg Per Tube 4 times per day 06/13/11 1045 06/13/11 1600   06/13/11 1200   metroNIDAZOLE (FLAGYL) IVPB 500 mg  Status:  Discontinued        500 mg 100 mL/hr over 60 Minutes Intravenous Every 8 hours 06/13/11 1045 06/13/11 1600   06/12/11 1000   Ribavirin SOLN 400 mg  Status:  Discontinued     Comments: Special order item.       400 mg Per Tube 3 times daily 06/11/11 1346 06/18/11 1553   06/11/11 1600   ribavirin (REBETOL) capsule 400 mg  Status:  Discontinued        400 mg Oral 3 times daily 06/11/11 1239 06/11/11 1343   06/08/11 2200   ceFEPIme (MAXIPIME) 2 g in dextrose 5 % 50 mL IVPB     Comments: Use Cefepime 1 g IV q12h for CrCl< 60 mL/min      2 g 100 mL/hr over 30 Minutes Intravenous Every 12 hours 06/08/11 1536 06/11/11 2320   06/08/11 0800   piperacillin-tazobactam (ZOSYN) IVPB 3.375 g  Status:  Discontinued        3.375 g 12.5 mL/hr over 240 Minutes Intravenous Every 8 hours 06/08/11 0149 06/08/11 1155   06/08/11 0600    micafungin (MYCAMINE) 100 mg in sodium chloride 0.9 % 100 mL IVPB  Status:  Discontinued        100 mg 100 mL/hr over 1 Hours Intravenous Daily 06/08/11 0308 06/10/11 1703   06/08/11 0200  piperacillin-tazobactam (ZOSYN) IVPB 3.375 g       3.375 g 100 mL/hr over 30 Minutes Intravenous  Once 06/08/11 0149 06/08/11 0254   06/06/11 0600   vancomycin (VANCOCIN) 1,750 mg in sodium chloride 0.9 % 500 mL IVPB  Status:  Discontinued        1,750 mg 250 mL/hr over 120 Minutes Intravenous Every 24 hours 06/05/11 0906 06/08/11 0403   06/05/11 2200   levofloxacin (LEVAQUIN) IVPB 750 mg  Status:  Discontinued     Comments: Use Levaquin 750 mg IV q48h for CrCl < 3mL/min      750 mg 100 mL/hr over 90 Minutes Intravenous Every 48 hours 06/04/11 0344 06/05/11 0908   06/05/11 1000   levofloxacin (LEVAQUIN) IVPB 750 mg  Status:  Discontinued     Comments: Use Levaquin 750 mg IV q48h for CrCl < 24mL/min      750 mg 100  mL/hr over 90 Minutes Intravenous Every 24 hours 06/05/11 0908 06/06/11 0908   06/04/11 1200   sulfamethoxazole-trimethoprim (BACTRIM) 664 mg in dextrose 5 % 500 mL IVPB  Status:  Discontinued        20 mg/kg/day  132.7 kg 361 mL/hr over 90 Minutes Intravenous 4 times per day 06/04/11 1039 06/06/11 0908   06/04/11 1100   micafungin (MYCAMINE) 100 mg in sodium chloride 0.9 % 100 mL IVPB  Status:  Discontinued        100 mg 100 mL/hr over 1 Hours Intravenous Daily 06/04/11 1030 06/06/11 0908   06/04/11 0600   ceFEPIme (MAXIPIME) 1 g in dextrose 5 % 50 mL IVPB  Status:  Discontinued     Comments: Use Cefepime 1 g IV q12h for CrCl< 60 mL/min      1 g 100 mL/hr over 30 Minutes Intravenous Every 12 hours 06/04/11 0344 06/08/11 1536   06/04/11 0600   vancomycin (VANCOCIN) 1,500 mg in sodium chloride 0.9 % 500 mL IVPB  Status:  Discontinued        1,500 mg 250 mL/hr over 120 Minutes Intravenous Every 24 hours 06/04/11 0425 06/05/11 0906   06/04/11 0430   levofloxacin (LEVAQUIN) IVPB 750  mg        750 mg 100 mL/hr over 90 Minutes Intravenous  Once 06/04/11 0354 06/04/11 0632   06/03/11 2330   vancomycin (VANCOCIN) IVPB 1000 mg/200 mL premix        1,000 mg 200 mL/hr over 60 Minutes Intravenous  Once 06/03/11 2326 06/04/11 0051   06/03/11 2330  piperacillin-tazobactam (ZOSYN) IVPB 3.375 g       3.375 g 12.5 mL/hr over 240 Minutes Intravenous  Once 06/03/11 2326 06/04/11 0521          Medications:  Scheduled:   . antiseptic oral rinse  15 mL Mouth Rinse QID  . chlorhexidine  15 mL Mouth Rinse BID  . collagenase   Topical Daily  . darbepoetin (ARANESP) injection - DIALYSIS  100 mcg Intravenous Q Wed-HD  . feeding supplement (OXEPA)  1,000 mL Per Tube Q24H  . feeding supplement  60 mL Per Tube QID  . ferric gluconate (FERRLECIT/NULECIT) IV  125 mg Intravenous Daily  . mulitivitamin  5 mL Per Tube Daily  . ondansetron      . pantoprazole sodium  40 mg Per Tube BID  . potassium chloride  40 mEq Oral Daily  . predniSONE  10 mg Per Tube Q breakfast  . sirolimus  0.75 mg Oral Daily  . white petrolatum        Objective: Vital signs in last 24 hours: Temp:  [97.7 F (36.5 C)-99.6 F (37.6 C)] 98.6 F (37 C) (05/27 0821) Pulse Rate:  [79-136] 136  (05/27 1100) Resp:  [22-27] 25  (05/27 0821) BP: (96-123)/(60-86) 120/86 mmHg (05/27 1100) SpO2:  [95 %-100 %] 97 % (05/27 1100) FiO2 (%):  [30 %-35 %] 35 % (05/27 1100) Weight:  [106 kg (233 lb 11 oz)] 106 kg (233 lb 11 oz) (05/27 0500)   General appearance: alert, cooperative and no distress Resp: rhonchi bilaterally Cardio: regular rate and rhythm GI: normal findings: bowel sounds normal and soft, non-tender Extremities: edema >3+, venous insufficiency changes (unchanged), no change in heat, tender with squeezing.   Lab Results  Basename 06/23/11 0429 06/22/11 0411  WBC 19.7* 17.3*  HGB 8.8* 9.2*  HCT 30.8* 32.4*  NA 140 134*  K 4.8 4.0  CL 104 99  CO2 24 25  BUN 60* 30*  CREATININE 2.89* 1.29  GLU  -- --   Liver Panel  Basename 06/23/11 0429 06/22/11 0411  PROT -- 8.9*  ALBUMIN 2.1* 2.2*2.3*  AST -- 154*  ALT -- 61*  ALKPHOS -- 259*  BILITOT -- 2.2*  BILIDIR -- 1.0*  IBILI -- 1.2*   Sedimentation Rate No results found for this basename: ESRSEDRATE in the last 72 hours C-Reactive Protein No results found for this basename: CRP:2 in the last 72 hours  Microbiology: Recent Results (from the past 240 hour(s))  CLOSTRIDIUM DIFFICILE BY PCR     Status: Normal   Collection Time   06/13/11  1:14 PM      Component Value Range Status Comment   C difficile by pcr NEGATIVE  NEGATIVE  Final   CLOSTRIDIUM DIFFICILE BY PCR     Status: Normal   Collection Time   06/19/11 10:25 AM      Component Value Range Status Comment   C difficile by pcr NEGATIVE  NEGATIVE  Final   CULTURE, BLOOD (ROUTINE X 2)     Status: Normal (Preliminary result)   Collection Time   06/19/11 10:57 AM      Component Value Range Status Comment   Specimen Description BLOOD LEFT ARM   Final    Special Requests BOTTLES DRAWN AEROBIC AND ANAEROBIC 10CC   Final    Culture  Setup Time [id number]   Final    Culture     Final    Value:        BLOOD CULTURE RECEIVED NO GROWTH TO DATE CULTURE WILL BE HELD FOR 5 DAYS BEFORE ISSUING A FINAL NEGATIVE REPORT   Report Status PENDING   Incomplete   CULTURE, BLOOD (ROUTINE X 2)     Status: Normal (Preliminary result)   Collection Time   06/19/11 10:58 AM      Component Value Range Status Comment   Specimen Description BLOOD LEFT HAND   Final    Special Requests BOTTLES DRAWN AEROBIC AND ANAEROBIC 10CC   Final    Culture  Setup Time [id number]   Final    Culture     Final    Value:        BLOOD CULTURE RECEIVED NO GROWTH TO DATE CULTURE WILL BE HELD FOR 5 DAYS BEFORE ISSUING A FINAL NEGATIVE REPORT   Report Status PENDING   Incomplete     Studies/Results: Dg Chest Port 1 View  06/22/2011  *RADIOLOGY REPORT*  Clinical Data: Follow up pneumonia.  PORTABLE CHEST - 1  VIEW  Comparison: 06/20/2011  Findings: New right jugular dialysis catheter tip in the lower SVC. Feeding tube extends into the abdomen.  There is a tracheostomy tube.  The patient has low lung volumes.  Densities along the medial left lung base could represent atelectasis or airspace disease.  No evidence for a pneumothorax. Left jugular central line tip in the SVC.  IMPRESSION: Placement of a right jugular tunneled dialysis catheter.  Catheter tip in the lower SVC.  Left basilar densities suggestive for atelectasis or focal airspace disease.  Original Report Authenticated By: Richarda Overlie, M.D.     Assessment/Plan: Renal Txp (sirolimus, hydrocortisone)  On CRRT  Pneumonia (parainfluenza), viral CM?  Off ribavirin, IVIg,  Day 5 vanco ? Appears to have been stopped unless getting at HD Repeat BCx- ngtd.  Cellulitis- LE dopplers (-)  Would- stop vanco at day 7 (if he is still receiving it).  Call dental  in AM- he has a L upper molar that is "cracked in half" and is black.   Please call if questions, thanks   Johny Sax Infectious Diseases [phone] 06/23/2011, 12:05 PM   LOS: 20 days

## 2011-06-23 NOTE — Evaluation (Signed)
[facility] Speaking Valve - Evaluation Patient Details  Name: Chad [name] MRN: [id number] Date of Birth: Jun 22, 1955  Today's Date: 06/23/2011 Time: 1120-1200 SLP Time Calculation (min): 40 min  Past Medical History:  Past Medical History  Diagnosis Date  . Hypertension   . H/O kidney transplant    Past Surgical History:  Past Surgical History  Procedure Date  . Nephrectomy transplanted organ    HPI:  56 yo male admitted 06/03/2011 with cough, dyspnea and fever.  Progressed to VDRF 05/08, attempted extubation on 5/17, reintubated until trach placement on 5/20.  Has hx of renal transplant.   Assessment / Plan / Recommendation Clinical Impression  Pt presents with adequate redirection of air to upper airway for phonation, no CO2 trapping observed over 40 minute trial. Pt did exhibit spikes in respiratory rate though this also occurs without the valve in place. Pts cognition in tact and pt return demonstrated removal of valve. Pt is safe to wear PMSV during all waking hours iwth intermittent supervision. Pt would benefit from downsize to 6 cuffless trach for improved breath support for volume and expectoration if he continues to tolerate trach collar 24 hours a day. Pt also would benefit from swallow eval. MD please order.     SLP Assessment  Patient needs continued Speech Lanaguage Pathology Services    Follow Up Recommendations  Inpatient Rehab    Frequency and Duration min 2x/week  2 weeks   Pertinent Vitals/Pain Na    SLP Goals Potential to Achieve Goals: Good Progress/Goals/Alternative treatment plan discussed with pt/caregiver and they: Agree SLP Goal #1: Pt will wear PMSV during all waking hours with intermittent supervision with no significant change in vital signs.  SLP Goal #2: Pt will demonstrate independence with removal and placement of valve.  SLP Goal #3: Pt will phonate at conversation level with normal volume with min verbal cues for 15 minutes.    PMSV  Trial  PMSV was placed for: 40 minutes Able to redirect subglottic air through upper airway: Yes Able to Attain Phonation: Yes Voice Quality: Normal Able to Expectorate Secretions: No Level of Secretion Expectoration with PMSV:  (not observed) Breath Support for Phonation: Mildly decreased Intelligibility: Intelligible Respirations During Trial: 32  (at max, then drops to 15) SpO2 During Trial: 100 % Pulse During Trial: 98  Behavior: Alert;Cooperative   Tracheostomy Tube  Additional Tracheostomy Tube Assessment Trach Collar Period: 24 hours Secretion Description: not observed Frequency of Tracheal Suctioning: rare    Vent Dependency  FiO2 (%): 35 %    Cuff Deflation Trial Tolerated Cuff Deflation: Yes Length of Time for Cuff Deflation Trial: 40 minutes Behavior: Alert;Cooperative   Yeilin Zweber, Riley Nearing 06/23/2011, 1:22 PM
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
## 2011-06-23 NOTE — Progress Notes (Signed)
ASSName: Chad [name] MRN: [id number] DOB: Oct 22, 1955    LOS: 20  PCCM  NOTE  History of Present Illness:  56 yo male admitted 06/03/2011 with cough, dyspnea and fever.  Progressed to VDRF 05/08 and PCCM consulted.  Has hx of renal transplant. PMHx Hep C, HTN, Gout  Cultures: BAL bronch 5/8>>>neg    AFB >> neg   Viral panel >> POS parainfluenza virus   Pneumocystis >> neg   Fungus >> neg   HSV >> neg BC 5/7>>>neg  Blood X 2 5/12 >> neg  Antibiotics: (per ID) 5/7 levoflox>>>5/10 5/7 bactrim >>>5/10 5/7 micafungin >> 5/14 5/7 vanc >> 5/10 5/10 cefepime >> 5/15 5/15 Ribaviran/IVIG >>Completed  Tests / Events: 5/8 hypoxic resp failure, intubated, near arrest 5/11 worsening shock , on sepsis protocol + dobut for low co-ox 5/15 Family mtg: continue full aggressive support but NCB if arrests. To reassess in 4-5 days. If no progress, consider withdrawal at that time 5/15 Trial of Ribaviran/IVIG 5/16 Markedly improved - reduced pressors, improved cognition, gas exchange improved 5/17 continued improvement. Tolerates PS 5 cm H2O with Vt > 800cc 5/17- extubated, re intubated 5/20- trach 5/24 Tunneled HD cath placed 5/26 CRRT stopped 5/27 Speech valve fitted.  Subjective: Doing better.  Tolerating trach collar.  Speaking with speech valve.  C/o tooth pain.  Sitting in chair.  Vital Signs: Temp:  [97.7 F (36.5 C)-99.6 F (37.6 C)] 99.3 F (37.4 C) (05/27 1204) Pulse Rate:  [79-136] 87  (05/27 1204) Resp:  [18-27] 18  (05/27 1204) BP: (90-123)/(60-86) 90/67 mmHg (05/27 1204) SpO2:  [95 %-100 %] 100 % (05/27 1204) FiO2 (%):  [30 %-35 %] 35 % (05/27 1204) Weight:  [233 lb 11 oz (106 kg)] 233 lb 11 oz (106 kg) (05/27 0500) I/O last 3 completed shifts: In: 2379.8 [I.V.:1117.8; NG/GT:1150; IV Piggyback:112] Out: 4116 [Urine:155; NWGNF:6213; Stool:1200]   Physical Examination: General - no distress HEENT - trach site clean, PANDA in place Cardiac - s1s2 regular, no  murmur Chest - scattered rhonchi Abd - soft, non tender, +bowel sounds Ext - 2+edema Neuro - alert, follows commands, moves all extremities   Ventilator settings: Vent Mode:  [-] Stand-by FiO2 (%):  [30 %-35 %] 35 %   CBC    Component Value Date/Time   WBC 19.7* 06/23/2011 0429   RBC 2.73* 06/23/2011 0429   HGB 8.8* 06/23/2011 0429   HCT 30.8* 06/23/2011 0429   PLT 85* 06/23/2011 0429   MCV 112.8* 06/23/2011 0429   MCH 32.2 06/23/2011 0429   MCHC 28.6* 06/23/2011 0429   RDW 28.0* 06/23/2011 0429   LYMPHSABS 2.1 06/18/2011 1545   MONOABS 2.4* 06/18/2011 1545   EOSABS 0.0 06/18/2011 1545   BASOSABS 0.2* 06/18/2011 1545    BMET    Component Value Date/Time   NA 140 06/23/2011 0429   K 4.8 06/23/2011 0429   CL 104 06/23/2011 0429   CO2 24 06/23/2011 0429   GLUCOSE 95 06/23/2011 0429   BUN 60* 06/23/2011 0429   CREATININE 2.89* 06/23/2011 0429   CALCIUM 8.7 06/23/2011 0429   GFRNONAA 23* 06/23/2011 0429   GFRAA 27* 06/23/2011 0429    Dg Chest Port 1 View  06/22/2011  *RADIOLOGY REPORT*  Clinical Data: Follow up pneumonia.  PORTABLE CHEST - 1 VIEW  Comparison: 06/20/2011  Findings: New right jugular dialysis catheter tip in the lower SVC. Feeding tube extends into the abdomen.  There is a tracheostomy tube.  The patient has low lung volumes.  Densities along the medial left lung base could represent atelectasis or airspace disease.  No evidence for a pneumothorax. Left jugular central line tip in the SVC.  IMPRESSION: Placement of a right jugular tunneled dialysis catheter.  Catheter tip in the lower SVC.  Left basilar densities suggestive for atelectasis or focal airspace disease.  Original Report Authenticated By: Richarda Overlie, M.D.     Assessment and Plan:  PULMONARY  Bronchoscopy 05/08>>diffuse, thick secretions ETT 5/8 >>5/17>>>retubed 6 hrs>>>5/20 Trach 5/20 (df)>>>  Acute respiratory failure 2nd to pneumonia, ARDS.  Required trach 05/20. Plan: -f/u CXR intermittently -trach collar  as tolerated>>only use vent as needed -speech valve fitted -may be able to downsize trach soon   CARDIAC Lt IJ CVL 05/08>> Rt Radial aline 05/08>>out R IJ HD cath 5/12 >> 5/13 (inadvertent removal) R IJ HD cath 5/13 >>5/24 Tunneled HD cath 5/24>>   5/08 Doppler legs>>negative for DVT 5/12 Echo>>EF 15 to 20%, mild LVH, diffuse hypokinesis, mild MR, mod/severe LA dilation, mod RV systolic dysfx, PAS 52 mmHg 5/23 Doppler legs>>negative for DVT  Shock>>likely septic and cardiogenic.  Resolved.  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 Plan: -continue amiodarone per cardiology>>plan to change to enteral amiodarone 5/26   RENAL  Acute on chronic renal failure with hx of renal transplant.  CVVH started 5/12>>stopped 5/26. Plan: -HD per renal -continue sirolimus, prednisone   ID  Blood 5/23>> C diff 5/23>>negative  Vancomycin 5/23>>  Parainfluenza pneumonia.  Given IVIG 5/15.  Ribavarin started 5/16, and completed 7 day course. Plan: -monitor clinically off therapy  Cellulitis. -D5/7 Vancomycin per ID  Fever.  No recurrence since 5/22.   -If persists may need CT abd/pelvis, and broaden Abx  Tooth pain. -will need to call for dental eval 5/27   HEMATOLOGY  Anemia of critical illness, chronic disease, and renal failure. Plan: -f/u CBC -transfuse for Hb < 7 -continue nulecit, aranesp per renal  Leukocytosis>>improving.  Thrombocytopenia. Plan: -f/u CBC   ENDOCRINE  Steroid induced hyperglycemia. Plan: -monitor blood sugar on BMET   NEUROLOGY  Mild agitation.  Improved. Plan: -monitor mental status   GASTROENTEROLOGY  Nurtition. Plan: -continue tube feeds -should be okay to proceed with swallow eval  Hx of Hepatitis C. Plan: -f/u LFT intermittently   [facility] PRACTICE -BID protonix for SUP -SCD for DVT prophylaxis -Full code -PT/OT  -keep in SDU, PCCM  service  Coralyn Helling, MD 06/23/2011, 1:08 PM Pager:  [phone]

## 2011-06-24 ENCOUNTER — Encounter (HOSPITAL_COMMUNITY): Payer: Self-pay | Admitting: Surgery

## 2011-06-24 ENCOUNTER — Inpatient Hospital Stay (HOSPITAL_COMMUNITY): Payer: BC Managed Care – PPO

## 2011-06-24 DIAGNOSIS — G7281 Critical illness myopathy: Secondary | ICD-10-CM

## 2011-06-24 DIAGNOSIS — A4189 Other specified sepsis: Secondary | ICD-10-CM

## 2011-06-24 DIAGNOSIS — J9600 Acute respiratory failure, unspecified whether with hypoxia or hypercapnia: Secondary | ICD-10-CM

## 2011-06-24 LAB — RENAL FUNCTION PANEL
BUN: 85 mg/dL — ABNORMAL HIGH (ref 6–23)
CO2: 23 mEq/L (ref 19–32)
Calcium: 8.7 mg/dL (ref 8.4–10.5)
Creatinine, Ser: 4.11 mg/dL — ABNORMAL HIGH (ref 0.50–1.35)
Glucose, Bld: 112 mg/dL — ABNORMAL HIGH (ref 70–99)
Phosphorus: 6.8 mg/dL — ABNORMAL HIGH (ref 2.3–4.6)

## 2011-06-24 LAB — CBC
MCH: 32.9 pg (ref 26.0–34.0)
Platelets: 72 10*3/uL — ABNORMAL LOW (ref 150–400)
RBC: 2.58 MIL/uL — ABNORMAL LOW (ref 4.22–5.81)
WBC: 17.9 10*3/uL — ABNORMAL HIGH (ref 4.0–10.5)

## 2011-06-24 MED ORDER — AMIODARONE HCL 200 MG PO TABS
200.0000 mg | ORAL_TABLET | Freq: Every day | ORAL | Status: DC
  Administered 2011-06-24 – 2011-06-27 (×4): 200 mg via ORAL
  Filled 2011-06-24 (×4): qty 1

## 2011-06-24 MED ORDER — HYDROCODONE-ACETAMINOPHEN 5-325 MG PO TABS
1.0000 | ORAL_TABLET | Freq: Once | ORAL | Status: AC
  Administered 2011-06-25: 2 via ORAL
  Filled 2011-06-24: qty 2

## 2011-06-24 MED ORDER — PANTOPRAZOLE SODIUM 40 MG PO TBEC
40.0000 mg | DELAYED_RELEASE_TABLET | Freq: Two times a day (BID) | ORAL | Status: DC
  Administered 2011-06-25 – 2011-06-27 (×5): 40 mg via ORAL
  Filled 2011-06-24 (×6): qty 1

## 2011-06-24 NOTE — Progress Notes (Signed)
I met with patient at bedside. Discussed the inpt rehab venue. I will begin discussions with his [employer] [place] to admit to CIR once medically ready. Please advise to when patient likely to be medically ready for planning purposes with insurance company. [phone]

## 2011-06-24 NOTE — Progress Notes (Addendum)
Physical Therapy Treatment Patient Details Name: Chad [name] MRN: [id number] DOB: 02-26-1955 Today's Date: 06/24/2011 Time: 4098-1191 PT Time Calculation (min): 24 min  PT Assessment / Plan / Recommendation Comments on Treatment Session  Patient s/p PNA and influenza with decr mobility secondary to decr strength and decr endurance.  Will benefit from PT to address strength and endurance issues.      Follow Up Recommendations  Inpatient Rehab    Barriers to Discharge  None      Equipment Recommendations  Defer to next venue    Recommendations for Other Services Rehab consult  Frequency Min 3X/week   Plan Discharge plan remains appropriate;Frequency remains appropriate    Precautions / Restrictions Precautions Precautions: Fall Restrictions Weight Bearing Restrictions: No   Pertinent Vitals/Pain VSS/ No pain    Mobility  Bed Mobility Bed Mobility: Supine to Sit;Sitting - Scoot to Edge of Bed Supine to Sit: 3: Mod assist;With rails;HOB flat Sitting - Scoot to Edge of Bed: 5: Supervision Details for Bed Mobility Assistance: cues for hand placement and technique.  Physical assist needed for trunk elevation.   Transfers Transfers: Sit to Stand;Stand to Sit;Stand Pivot Transfers Sit to Stand: 1: +2 Total assist;With upper extremity assist;From bed;From elevated surface Sit to Stand: Patient Percentage: 60% Stand to Sit: 1: +2 Total assist;With upper extremity assist;With armrests;To chair/3-in-1 Stand to Sit: Patient Percentage: 70% Stand Pivot Transfers: 1: +2 Total assist Stand Pivot Transfers: Patient Percentage: 70% Details for Transfer Assistance: Patient transferred stand pivot with RW with  good weight shift with verbal cues only for technique.  Cues for hand placement as well.  Ambulation/Gait Ambulation/Gait Assistance: Not tested (comment) Stairs: No Wheelchair Mobility Wheelchair Mobility: No    Exercises General Exercises - Lower Extremity Ankle  Circles/Pumps: AROM;5 reps;Seated;Both Long Arc Quad: AROM;Both;5 reps;Seated   PT Goals Acute Rehab PT Goals PT Goal: Sit to Stand - Progress: Progressing toward goal PT Transfer Goal: Bed to Chair/Chair to Bed - Progress: Progressing toward goal PT Goal: Ambulate - Progress: Progressing toward goal  Visit Information  Last PT Received On: 06/24/11 Assistance Needed: +2    Subjective Data  Subjective: "I will try."   Cognition  Overall Cognitive Status: Appears within functional limits for tasks assessed/performed Arousal/Alertness: Awake/alert Orientation Level: Appears intact for tasks assessed Behavior During Session: [facility] for tasks performed    Balance  Static Sitting Balance Static Sitting - Balance Support: Bilateral upper extremity supported;Feet supported Static Sitting - Level of Assistance: 6: Modified independent (Device/Increase time) Static Sitting - Comment/# of Minutes: 5  End of Session PT - End of Session Equipment Utilized During Treatment: Gait belt Activity Tolerance: Patient limited by fatigue Patient left: in chair;with call bell/phone within reach;with family/visitor present Nurse Communication: Mobility status    [name],Chad [name] 06/24/2011, 2:23 PM [facility] [phone] (pager)

## 2011-06-24 NOTE — Progress Notes (Signed)
[facility] ROUNDING NOTE   Subjective:   Interval History: none.  Objective:  Vital signs in last 24 hours:  Temp:  [98.5 F (36.9 C)-99.3 F (37.4 C)] 98.7 F (37.1 C) (05/28 0821) Pulse Rate:  [76-104] 104  (05/28 0845) Resp:  [18-28] 28  (05/28 0845) BP: (90-114)/(59-80) 110/80 mmHg (05/28 0845) SpO2:  [94 %-100 %] 100 % (05/28 0845) FiO2 (%):  [21 %-35 %] 28 % (05/28 0845) Weight:  [106.6 kg (235 lb 0.2 oz)] 106.6 kg (235 lb 0.2 oz) (05/28 0005)  Weight change: 0.6 kg (1 lb 5.2 oz) Filed Weights   06/22/11 0400 06/23/11 0500 06/24/11 0005  Weight: 102.8 kg (226 lb 10.1 oz) 106 kg (233 lb 11 oz) 106.6 kg (235 lb 0.2 oz)    Intake/Output: I/O last 3 completed shifts: In: 1857.8 [I.V.:907.8; NG/GT:950] Out: 1955 [Urine:555; Stool:1400]   Intake/Output this shift:  Total I/O In: -  Out: 100 [Urine:100]  Awake and alert. Trach collar in place, no distress.  RS Coarse BS but clear  CVS No pericardial rub Ht sds somewhat distant RRR  Abdomen without focal tenderness; allograft not tender  Exts with improved edema, 1-2+ pedal, no pretibial  Right ij tem HD cath and R SCV TDC in place    Basic Metabolic Panel:  Lab 06/24/11 9604 06/23/11 0429 06/22/11 0411 06/21/11 0430 06/20/11 1500 06/20/11 0500 06/19/11 0435 06/18/11 0522  NA 137 140 134* 138 135 -- -- --  K 5.0 4.8 4.0 3.6 4.2 -- -- --  CL 100 104 99 102 101 -- -- --  CO2 23 24 25 25 24  -- -- --  GLUCOSE 112* 95 97 92 132* -- -- --  BUN 85* 60* 30* 34* 34* -- -- --  CREATININE 4.11* 2.89* 1.29 1.45* 1.57* -- -- --  CALCIUM 8.7 8.7 8.5 -- -- -- -- --  MG -- -- 2.5 2.4 -- 2.4 2.2 2.2  PHOS 6.8* 5.6* 2.3 2.6 3.8 -- -- --    Liver Function Tests:  Lab 06/24/11 0430 06/23/11 0429 06/22/11 0411 06/21/11 0430 06/20/11 1500  AST -- -- 154* -- --  ALT -- -- 61* -- --  ALKPHOS -- -- 259* -- --  BILITOT -- -- 2.2* -- --  PROT -- -- 8.9* -- --  ALBUMIN 2.0* 2.1* 2.2*2.3* 2.1* 2.0*   No results  found for this basename: LIPASE:5,AMYLASE:5 in the last 168 hours No results found for this basename: AMMONIA:3 in the last 168 hours  CBC:  Lab 06/24/11 0430 06/23/11 0429 06/22/11 0411 06/21/11 0430 06/20/11 0500 06/18/11 1545 06/17/11 1349  WBC 17.9* 19.7* 17.3* 17.7* 23.5* -- --  NEUTROABS -- -- -- -- -- 18.9* 13.2*  HGB 8.5* 8.8* 9.2* 8.5* 8.4* -- --  HCT 28.7* 30.8* 32.4* 29.7* 29.0* -- --  MCV 111.2* 112.8* 112.5* 109.2* 107.8* -- --  PLT 72* 85* 92* 86* 95* -- --    Cardiac Enzymes: No results found for this basename: CKTOTAL:5,CKMB:5,CKMBINDEX:5,TROPONINI:5 in the last 168 hours  BNP: No components found with this basename: POCBNP:5  CBG:  Lab 06/18/11 1120 06/18/11 0720 06/18/11 0347 06/17/11 2340 06/17/11 1941  GLUCAP 114* 100* 103* 113* 117*    Microbiology: Results for orders placed during the hospital encounter of 06/03/11  URINE CULTURE     Status: Normal   Collection Time   06/03/11 10:53 PM      Component Value Range Status Comment   Specimen Description URINE, RANDOM   Final  Special Requests ADDED [id number]   Final    Culture  Setup Time [id number]   Final    Colony Count >=100,000 COLONIES/ML   Final    Culture     Final    Value: Multiple bacterial morphotypes present, none predominant. Suggest appropriate recollection if clinically indicated.   Report Status 06/05/2011 FINAL   Final   CULTURE, BLOOD (ROUTINE X 2)     Status: Normal   Collection Time   06/03/11 11:45 PM      Component Value Range Status Comment   Specimen Description BLOOD RIGHT ARM   Final    Special Requests BOTTLES DRAWN AEROBIC AND ANAEROBIC 10CC   Final    Culture  Setup Time [id number]   Final    Culture NO GROWTH 5 DAYS   Final    Report Status 06/10/2011 FINAL   Final   CULTURE, BLOOD (ROUTINE X 2)     Status: Normal   Collection Time   06/03/11 11:50 PM      Component Value Range Status Comment   Specimen Description BLOOD RIGHT ARM   Final    Special Requests      Final    Value: BOTTLES DRAWN AEROBIC AND ANAEROBIC 5CC AEROBOC 3CC ANAEROBIC   Culture  Setup Time [id number]   Final    Culture NO GROWTH 5 DAYS   Final    Report Status 06/10/2011 FINAL   Final   MRSA PCR SCREENING     Status: Normal   Collection Time   06/04/11  3:08 AM      Component Value Range Status Comment   MRSA by PCR NEGATIVE  NEGATIVE  Final   RESPIRATORY VIRUS PANEL (18 COMPONENTS)     Status: Abnormal   Collection Time   06/04/11  6:09 AM      Component Value Range Status Comment   Source - RVPAN NASAL MUCOSA   Final    Respiratory Syncytial Virus A NOT DETECTED   Final    Respiratory Syncytial Virus B NOT DETECTED   Final    Influenza A NOT DETECTED   Final    Influenza B NOT DETECTED   Final    Parainfluenza 1 NOT DETECTED   Final    Parainfluenza 2 NOT DETECTED   Final    Parainfluenza 3 DETECTED (*)  Final    Parainfluenza 4 NOT DETECTED   Final    Metapneumovirus NOT DETECTED   Final    Coxsackie and Echovirus NOT DETECTED   Final    Rhinovirus NOT DETECTED   Final    Adenovirus B NOT DETECTED   Final    Adenovirus E NOT DETECTED   Final    CoronavirusNL63 NOT DETECTED   Final    CoronavirusHKU1 NOT DETECTED   Final    Coronavirus229E NOT DETECTED   Final    CoronavirusOC43 NOT DETECTED   Final   CULTURE, EXPECTORATED SPUTUM-ASSESSMENT     Status: Normal   Collection Time   06/04/11  7:48 AM      Component Value Range Status Comment   Specimen Description SPUTUM   Final    Special Requests NONE   Final    Sputum evaluation     Final    Value: MICROSCOPIC FINDINGS SUGGEST THAT THIS SPECIMEN IS NOT REPRESENTATIVE OF LOWER RESPIRATORY SECRETIONS. PLEASE RECOLLECT.     CALLED TO D ORTIZ,RN 06/04/11 0911 BY K SCHULTZ   Report Status 06/04/2011 FINAL   Final  AFB CULTURE WITH SMEAR     Status: Normal (Preliminary result)   Collection Time   06/04/11 10:30 AM      Component Value Range Status Comment   Specimen Description BRONCHIAL WASHINGS   Final    Special  Requests NONE   Final    ACID FAST SMEAR NO ACID FAST BACILLI SEEN   Final    Culture     Final    Value: CULTURE WILL BE EXAMINED FOR 6 WEEKS BEFORE ISSUING A FINAL REPORT   Report Status PENDING   Incomplete   PNEUMOCYSTIS JIROVECI SMEAR BY DFA     Status: Normal   Collection Time   06/04/11 10:30 AM      Component Value Range Status Comment   Specimen Source-PJSRC BRONCHIAL WASHINGS   Final    Pneumocystis jiroveci Ag NEGATIVE   Final Performed at [facility]  FUNGUS CULTURE W SMEAR     Status: Normal (Preliminary result)   Collection Time   06/04/11 10:30 AM      Component Value Range Status Comment   Specimen Description BRONCHIAL WASHINGS   Final    Special Requests NONE   Final    Fungal Smear NO YEAST OR FUNGAL ELEMENTS SEEN   Final    Culture CULTURE IN PROGRESS FOR FOUR WEEKS   Final    Report Status PENDING   Incomplete   CULTURE, RESPIRATORY     Status: Normal   Collection Time   06/04/11 10:30 AM      Component Value Range Status Comment   Specimen Description BRONCHIAL WASHINGS   Final    Special Requests NONE   Final    Gram Stain     Final    Value: FEW WBC PRESENT,BOTH PMN AND MONONUCLEAR     NO SQUAMOUS EPITHELIAL CELLS SEEN     NO ORGANISMS SEEN   Culture NO GROWTH 2 DAYS   Final    Report Status 06/07/2011 FINAL   Final   HSV PCR     Status: Normal   Collection Time   06/04/11 10:30 AM      Component Value Range Status Comment   HSV, PCR Not Detected  Not Detected  Final    HSV 2 , PCR Not Detected  Not Detected  Final    Specimen Source-HSVPCR NO GROWTH   Final   CULTURE, BLOOD (ROUTINE X 2)     Status: Normal   Collection Time   06/04/11  3:15 PM      Component Value Range Status Comment   Specimen Description BLOOD RIGHT HAND   Final    Special Requests BOTTLES DRAWN AEROBIC AND ANAEROBIC 10CC   Final    Culture  Setup Time [id number]   Final    Culture NO GROWTH 5 DAYS   Final    Report Status 06/11/2011 FINAL   Final   CULTURE, BLOOD  (ROUTINE X 2)     Status: Normal   Collection Time   06/04/11  3:27 PM      Component Value Range Status Comment   Specimen Description BLOOD LEFT HAND   Final    Special Requests BOTTLES DRAWN AEROBIC ONLY 3CC   Final    Culture  Setup Time [id number]   Final    Culture NO GROWTH 5 DAYS   Final    Report Status 06/11/2011 FINAL   Final   CULTURE, BLOOD (ROUTINE X 2)     Status: Normal  Collection Time   06/08/11  4:15 AM      Component Value Range Status Comment   Specimen Description BLOOD LEFT HAND   Final    Special Requests BOTTLES DRAWN AEROBIC ONLY 10CC   Final    Culture  Setup Time [id number]   Final    Culture NO GROWTH 5 DAYS   Final    Report Status 06/14/2011 FINAL   Final   CULTURE, BLOOD (ROUTINE X 2)     Status: Normal   Collection Time   06/08/11  4:20 AM      Component Value Range Status Comment   Specimen Description BLOOD LEFT HAND   Final    Special Requests BOTTLES DRAWN AEROBIC ONLY 10CC   Final    Culture  Setup Time [id number]   Final    Culture NO GROWTH 5 DAYS   Final    Report Status 06/14/2011 FINAL   Final   RESPIRATORY VIRUS PANEL (18 COMPONENTS)     Status: Abnormal   Collection Time   06/11/11  8:33 PM      Component Value Range Status Comment   Source - RVPAN BRONCHIAL ALVEOLAR LAVAGE   Final    Respiratory Syncytial Virus A NOT DETECTED   Final    Respiratory Syncytial Virus B NOT DETECTED   Final    Influenza A NOT DETECTED   Final    Influenza B NOT DETECTED   Final    Parainfluenza 1 NOT DETECTED   Final    Parainfluenza 2 NOT DETECTED   Final    Parainfluenza 3 DETECTED (*)  Final    Parainfluenza 4 NOT DETECTED   Final    Metapneumovirus NOT DETECTED   Final    Coxsackie and Echovirus NOT DETECTED   Final    Rhinovirus NOT DETECTED   Final    Adenovirus B NOT DETECTED   Final    Adenovirus E NOT DETECTED   Final    CoronavirusNL63 NOT DETECTED   Final    CoronavirusHKU1 NOT DETECTED   Final    Coronavirus229E NOT DETECTED    Final    CoronavirusOC43 NOT DETECTED   Final   CLOSTRIDIUM DIFFICILE BY PCR     Status: Normal   Collection Time   06/13/11  1:14 PM      Component Value Range Status Comment   C difficile by pcr NEGATIVE  NEGATIVE  Final   CLOSTRIDIUM DIFFICILE BY PCR     Status: Normal   Collection Time   06/19/11 10:25 AM      Component Value Range Status Comment   C difficile by pcr NEGATIVE  NEGATIVE  Final   CULTURE, BLOOD (ROUTINE X 2)     Status: Normal (Preliminary result)   Collection Time   06/19/11 10:57 AM      Component Value Range Status Comment   Specimen Description BLOOD LEFT ARM   Final    Special Requests BOTTLES DRAWN AEROBIC AND ANAEROBIC 10CC   Final    Culture  Setup Time [id number]   Final    Culture     Final    Value:        BLOOD CULTURE RECEIVED NO GROWTH TO DATE CULTURE WILL BE HELD FOR 5 DAYS BEFORE ISSUING A FINAL NEGATIVE REPORT   Report Status PENDING   Incomplete   CULTURE, BLOOD (ROUTINE X 2)     Status: Normal (Preliminary result)   Collection Time   06/19/11 10:58 AM  Component Value Range Status Comment   Specimen Description BLOOD LEFT HAND   Final    Special Requests BOTTLES DRAWN AEROBIC AND ANAEROBIC 10CC   Final    Culture  Setup Time [id number]   Final    Culture     Final    Value:        BLOOD CULTURE RECEIVED NO GROWTH TO DATE CULTURE WILL BE HELD FOR 5 DAYS BEFORE ISSUING A FINAL NEGATIVE REPORT   Report Status PENDING   Incomplete     Coagulation Studies: No results found for this basename: LABPROT:5,INR:5 in the last 72 hours  Urinalysis: No results found for this basename: COLORURINE:2,APPERANCEUR:2,LABSPEC:2,PHURINE:2,GLUCOSEU:2,HGBUR:2,BILIRUBINUR:2,KETONESUR:2,PROTEINUR:2,UROBILINOGEN:2,NITRITE:2,LEUKOCYTESUR:2 in the last 72 hours    Imaging: No results found.   Medications:      . sodium chloride 10 mL/hr at 06/23/11 0043  . DISCONTD: amiodarone (NEXTERONE PREMIX) 360 mg/200 mL dextrose 0.5 mg/min (06/24/11 0103)  .  DISCONTD: feeding supplement (OSMOLITE 1.5 CAL) 1,000 mL (06/23/11 1650)      . amiodarone  200 mg Oral Daily  . antiseptic oral rinse  15 mL Mouth Rinse QID  . chlorhexidine  15 mL Mouth Rinse BID  . collagenase   Topical Daily  . darbepoetin (ARANESP) injection - DIALYSIS  100 mcg Intravenous Q Wed-HD  . ferric gluconate (FERRLECIT/NULECIT) IV  125 mg Intravenous Daily  . pantoprazole sodium  40 mg Per Tube BID  . potassium chloride  40 mEq Oral Daily  . predniSONE  10 mg Per Tube Q breakfast  . sirolimus  0.75 mg Oral Daily  . DISCONTD: feeding supplement (OXEPA)  1,000 mL Per Tube Q24H  . DISCONTD: feeding supplement  30 mL Per Tube Daily  . DISCONTD: feeding supplement  60 mL Per Tube QID  . DISCONTD: mulitivitamin  5 mL Per Tube Daily   albuterol, fentaNYL, metoprolol, ondansetron (ZOFRAN) IV  Assessment/ Plan:  is a 56 y.o. yo male s/p renal transplant who was admitted on 06/03/2011 with PNA pulm HTN, volume overload now with acute on chronic was dialysis  1. PNA/parainfluenza/cardiomyopathy/hypotension- completed RX for paraflu. Has trach and requiring vent support. Slow imp in CXR BP stable but soft no pressors 2. AKI, oliguric, HD dependent with CRRT 5/13- Will stop CRRT today. Has had excellent volume removal and has some mild LE edema left, but not a lot. No pulm edema. Using new R SCV tunneled HD cath now, and temporary HD cath has been removed. Do intermittent HD as needed.  3. 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 He is oliguric. Blood sirolimus level on 5/8 at 5 pm was 2.0  4. Anemia- hgb stable. Aranesp. Iron. 5. Secondary hyperparathyroidism- no treatment right now,  rocaltrol stopped - not essential at this time and calcium up 6. PNA parainfluenza- finished course  Watching for recovery Creatinine is worse but no indications for dialysis   LOS: 21 Jaidin Ugarte W @TODAY @11 :17 AM

## 2011-06-24 NOTE — Consult Note (Addendum)
Physical Medicine and Rehabilitation Consult Reason for Consult: Deconditioning  Referring Phsyician: Dr. Vassie Loll  HPI:  Chad [name] is an 56 y.o. Male with history of renal transplant, Hep C, admitted 06/03/11 with cough, dyspnea and fever.  Patient progressed to VDRF requiring intubation 05/08. BAL postive for parainfluenza virus and patient treated with antifungal, IVIG and broad spectrum antibiotics. Patient noted to require pressors due to combination of septic and cardiogenic shock.  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  He was noted to be oliguric with lymphedema and started on CVVHD with improvement. Patient trached 5/20  and tolerating trach collar.  He developed fever due to cellulitis LE and IV vancomycin X 7 days recommended by ID. PT/OT evaluations done yesterday and patient noted to be deconditioned. ST evaluation done and pt tolerating PMSV trials.MBS done today and patient started on regular, thin liquids with chin tuck to prevent aspiration.  MD, PT,OT,ST recommended CIR.  Review of Systems  HENT: Negative for hearing loss and neck pain.   Eyes: Positive for blurred vision. Negative for double vision.  Respiratory: Negative for shortness of breath.   Cardiovascular: Negative for chest pain and palpitations.  Gastrointestinal: Negative for heartburn and nausea.  Genitourinary: Negative for dysuria and frequency.  Musculoskeletal: Negative for myalgias.  Neurological: Negative for headaches.   Past Medical History  Diagnosis Date  . Hypertension   . H/O kidney transplant    Past Surgical History  Procedure Date  . Nephrectomy transplanted organ    History reviewed. No pertinent family history.  Social History:  reports that he has never smoked. He does not have any smokeless tobacco history on file. He reports that he does not drink alcohol. His drug history not on file.  Allergies: No Known  Allergies  Medications Prior to Admission  Medication Sig Dispense Refill  . acetaminophen (TYLENOL) 500 MG tablet Take 1,000 mg by mouth every 6 (six) hours as needed. For pain.      Marland Kitchen allopurinol (ZYLOPRIM) 100 MG tablet Take 100 mg by mouth daily.      . calcitRIOL (ROCALTROL) 0.5 MCG capsule Take 0.5-1 mcg by mouth daily. 2 tablets Monday Wednesday and Friday 1 tablet all other days      . diltiazem (DILACOR XR) 240 MG 24 hr capsule Take 240 mg by mouth daily.      . predniSONE (DELTASONE) 10 MG tablet Take 10 mg by mouth daily.      . sildenafil (VIAGRA) 50 MG tablet Take 50 mg by mouth daily as needed. For erectile dysfunction      . simvastatin (ZOCOR) 40 MG tablet Take 40 mg by mouth every evening.      . sirolimus (RAPAMUNE) 1 MG/ML solution Take 0.75 mg by mouth daily.      Marland Kitchen torsemide (DEMADEX) 20 MG tablet Take 20-40 mg by mouth daily. 2 tablets in the morning and 1-2 tablets in the evening        Home: Home Living Lives With: Alone Available Help at Discharge: Family;Friend(s);Available 24 hours/day Type of Home: House Home Access: Stairs to enter Entrance Stairs-Rails: Left;Right Home Layout: Two level;Able to live on main level with bedroom/bathroom Bathroom Shower/Tub: Tub/shower unit;Walk-in shower;Door;Curtain Bathroom Toilet: Handicapped height Home Adaptive Equipment: Walker - rolling;Straight cane  Functional History: Prior Function Able to Take Stairs?: Yes Driving: Yes Vocation: Full time employment Functional Status:  Mobility: Bed Mobility Bed Mobility: Supine to Sit;Sitting -  Scoot to Edge of Bed Supine to Sit: 3: Mod assist;With rails;HOB flat Sitting - Scoot to [place] of Bed: 5: Supervision Transfers Transfers: Sit to Stand;Stand to [employer] Transfers Sit to Stand: 1: +2 Total assist;With upper extremity assist;From bed;From elevated surface Sit to Stand: Patient Percentage: 60% Stand to Sit: 1: +2 Total assist;With upper extremity  assist;With armrests;To chair/3-in-1 Stand to Sit: Patient Percentage: 70% Stand Pivot Transfers: 1: +2 Total assist Stand Pivot Transfers: Patient Percentage: 70% Ambulation/Gait Ambulation/Gait Assistance: Not tested (comment) Stairs: No Wheelchair Mobility Wheelchair Mobility: No  ADL: ADL Grooming: Performed;Wash/dry face;Set up Where Assessed - Grooming: Unsupported sitting Upper Body Bathing: Simulated;Minimal assistance Where Assessed - Upper Body Bathing: Unsupported sitting Lower Body Bathing: Simulated;+2 Total assistance Where Assessed - Lower Body Bathing: Supported sit to stand Upper Body Dressing: Simulated;Set up Where Assessed - Upper Body Dressing: Unsupported standing Lower Body Dressing: Performed;+1 Total assistance Where Assessed - Lower Body Dressing: Unsupported sitting Toilet Transfer: Simulated;+2 Total assistance Toilet Transfer Method: Sit to stand;Stand pivot Equipment Used: Rolling walker ADL Comments: Pt very weak and deconditioned, fatigued quickly. HR increased to 136 with activity. Pt also with indwelling rectal tube which patient says caused him great discomfort.  Cognition: Cognition Arousal/Alertness: Awake/alert Orientation Level: Other (comment) Cognition Overall Cognitive Status: Appears within functional limits for tasks assessed/performed Arousal/Alertness: Awake/alert Orientation Level: Appears intact for tasks assessed Behavior During Session: [facility] for tasks performed  Blood pressure 137/88, pulse 82, temperature 97.4 F (36.3 C), temperature source Axillary, resp. rate 24, height 5\' 11"  (1.803 m), weight 106.6 kg (235 lb 0.2 oz), SpO2 100.00%. Physical Exam  Nursing note and vitals reviewed. Constitutional: He is oriented to person, place, and time. He appears well-developed and well-nourished.  HENT:  Head: Normocephalic and atraumatic.  Eyes: Pupils are equal, round, and reactive to light.  Neck: Normal range of motion. Neck  supple.  Cardiovascular: Normal rate and regular rhythm.   Pulmonary/Chest: Effort normal and breath sounds normal.       #8 Trach in Pl. Patient using Passy-Muir valve.  Abdominal: Soft. Bowel sounds are normal.  Musculoskeletal: Normal range of motion.       2+ pitting edema on either lower extremity  Neurological: He is alert and oriented to person, place, and time. No cranial nerve deficit.       Upper extremity strength grossly 4/5. Lower extremities are 2/5 proximal to 4/5 distally. No gross sensory deficits are appreciated. Cognitively he is intact.  Skin: Skin is warm and dry.  Psychiatric: He has a normal mood and affect. His behavior is normal. Judgment and thought content normal.    Results for orders placed during the hospital encounter of 06/03/11 (from the past 24 hour(s))  RENAL FUNCTION PANEL     Status: Abnormal   Collection Time   06/24/11  4:30 AM      Component Value Range   Sodium 137  135 - 145 (mEq/L)   Potassium 5.0  3.5 - 5.1 (mEq/L)   Chloride 100  96 - 112 (mEq/L)   CO2 23  19 - 32 (mEq/L)   Glucose, Bld 112 (*) 70 - 99 (mg/dL)   BUN 85 (*) 6 - 23 (mg/dL)   Creatinine, Ser 0.98 (*) 0.50 - 1.35 (mg/dL)   Calcium 8.7  8.4 - 11.9 (mg/dL)   Phosphorus 6.8 (*) 2.3 - 4.6 (mg/dL)   Albumin 2.0 (*) 3.5 - 5.2 (g/dL)   GFR calc non Af Amer 15 (*) >90 (mL/min)  GFR calc Af Amer 18 (*) >90 (mL/min)  CBC     Status: Abnormal   Collection Time   06/24/11  4:30 AM      Component Value Range   WBC 17.9 (*) 4.0 - 10.5 (K/uL)   RBC 2.58 (*) 4.22 - 5.81 (MIL/uL)   Hemoglobin 8.5 (*) 13.0 - 17.0 (g/dL)   HCT 14.7 (*) 82.9 - 52.0 (%)   MCV 111.2 (*) 78.0 - 100.0 (fL)   MCH 32.9  26.0 - 34.0 (pg)   MCHC 29.6 (*) 30.0 - 36.0 (g/dL)   RDW 56.2 (*) 13.0 - 15.5 (%)   Platelets 72 (*) 150 - 400 (K/uL)   No results found.  Assessment/Plan: Diagnosis: deconditioning, critical illness myopathy 1. Does the need for close, 24 hr/day medical supervision in concert with the  patient's rehab needs make it unreasonable for this patient to be served in a less intensive setting? Yes 2. Co-Morbidities requiring supervision/potential complications: History renal transplant, pneumonia, acute respiratory failure, sepsis 3. Due to bladder management, bowel management, safety, skin/wound care, disease management, medication administration, pain management and patient education, does the patient require 24 hr/day rehab nursing? Yes 4. Does the patient require coordinated care of a physician, rehab nurse, PT (1-2 hrs/day, 5 days/week), OT (1-2 hrs/day, 5 days/week) and SLP (1-2 hrs/day, 5 days/week) to address physical and functional deficits in the context of the above medical diagnosis(es)? Yes Addressing deficits in the following areas: balance, endurance, locomotion, strength, transferring, bowel/bladder control, bathing, dressing, feeding, grooming, toileting, speech, swallowing and psychosocial support 5. Can the patient actively participate in an intensive therapy program of at least 3 hrs of therapy per day at least 5 days per week? Yes 6. The potential for patient to make measurable gains while on inpatient rehab is excellent 7. Anticipated functional outcomes upon discharge from inpatient rehab are modified independent to supervision with PT, modified independent to minimal assistance with OT, modified independent with SLP. 8. Estimated rehab length of stay to reach the above functional goals is: 10-14 days 9. Does the patient have adequate social supports to accommodate these discharge functional goals? Yes 10. Anticipated D/C setting: Home 11. Anticipated post D/C treatments: HH therapy 12. Overall Rehab/Functional Prognosis: excellent  RECOMMENDATIONS: This patient's condition is appropriate for continued rehabilitative care in the following setting: CIR Patient has agreed to participate in recommended program. Yes Note that insurance prior authorization may be  required for reimbursement for recommended care.  Comment: We will follow along for medical stability.   Ranelle Oyster M.D. 06/24/2011

## 2011-06-24 NOTE — Progress Notes (Signed)
ASSName: Chad [name] MRN: [id number] DOB: 10/11/1955    LOS: 21  PCCM  NOTE  History of Present Illness:  56 yo male admitted 06/03/2011 with cough, dyspnea and fever.  Progressed to VDRF 05/08 and PCCM consulted.  Has hx of renal transplant. PMHx Hep C, HTN, Gout  Cultures: BAL bronch 5/8>>>neg    AFB >> neg   Viral panel >> POS parainfluenza virus   Pneumocystis >> neg   Fungus >> neg   HSV >> neg BC 5/7>>>neg  Blood X 2 5/12 >> neg  Antibiotics: (per ID) 5/7 levoflox>>>5/10 5/7 bactrim >>>5/10 5/7 micafungin >> 5/14 5/7 vanc >> 5/10 5/10 cefepime >> 5/15 5/15 Ribaviran/IVIG >>Completed  Tests / Events: 5/8 hypoxic resp failure, intubated, near arrest 5/11 worsening shock , on sepsis protocol + dobut for low co-ox 5/15 Family mtg: continue full aggressive support but NCB if arrests. To reassess in 4-5 days. If no progress, consider withdrawal at that time 5/15 Trial of Ribaviran/IVIG 5/16 Markedly improved - reduced pressors, improved cognition, gas exchange improved 5/17 continued improvement. Tolerates PS 5 cm H2O with Vt > 800cc 5/17- extubated, re intubated 5/20- trach 5/24 Tunneled HD cath placed 5/26 CRRT stopped 5/27 Speech valve fitted. 5/8 diet - regular, thin  Subjective: Tolerating trach collar.  Speaking with speech valve.  C/o tooth pain. Passed swallow evaln  Vital Signs: Temp:  [98.5 F (36.9 C)-99.3 F (37.4 C)] 98.7 F (37.1 C) (05/28 0821) Pulse Rate:  [76-104] 104  (05/28 0845) Resp:  [18-28] 28  (05/28 0845) BP: (90-114)/(59-80) 110/80 mmHg (05/28 0845) SpO2:  [94 %-100 %] 100 % (05/28 0845) FiO2 (%):  [21 %-35 %] 28 % (05/28 0845) Weight:  [106.6 kg (235 lb 0.2 oz)] 106.6 kg (235 lb 0.2 oz) (05/28 0005) I/O last 3 completed shifts: In: 1857.8 [I.V.:907.8; NG/GT:950] Out: 1955 [Urine:555; Stool:1400]   Physical Examination: General - no distress HEENT - trach site clean, PANDA in place Cardiac - s1s2 regular, no murmur Chest -  scattered rhonchi Abd - soft, non tender, +bowel sounds Ext - 2+edema Neuro - alert, follows commands, moves all extremities   Ventilator settings: Vent Mode:  [-]  FiO2 (%):  [21 %-35 %] 28 %   CBC    Component Value Date/Time   WBC 17.9* 06/24/2011 0430   RBC 2.58* 06/24/2011 0430   HGB 8.5* 06/24/2011 0430   HCT 28.7* 06/24/2011 0430   PLT 72* 06/24/2011 0430   MCV 111.2* 06/24/2011 0430   MCH 32.9 06/24/2011 0430   MCHC 29.6* 06/24/2011 0430   RDW 26.1* 06/24/2011 0430   LYMPHSABS 2.1 06/18/2011 1545   MONOABS 2.4* 06/18/2011 1545   EOSABS 0.0 06/18/2011 1545   BASOSABS 0.2* 06/18/2011 1545    BMET    Component Value Date/Time   NA 137 06/24/2011 0430   K 5.0 06/24/2011 0430   CL 100 06/24/2011 0430   CO2 23 06/24/2011 0430   GLUCOSE 112* 06/24/2011 0430   BUN 85* 06/24/2011 0430   CREATININE 4.11* 06/24/2011 0430   CALCIUM 8.7 06/24/2011 0430   GFRNONAA 15* 06/24/2011 0430   GFRAA 18* 06/24/2011 0430    No results found.   Assessment and Plan:  PULMONARY  Bronchoscopy 05/08>>diffuse, thick secretions ETT 5/8 >>5/17>>>retubed 6 hrs>>>5/20 Trach 5/20 (df)>>>  Acute respiratory failure 2nd to pneumonia, ARDS.  Required trach 05/20. Plan: -f/u CXR intermittently, last 5/26 - lt basilar ASD -trach collar as tolerated>>only use vent as needed -speech valve fitted -may be  able to downsize trach soon   CARDIAC Lt IJ CVL 05/08>> Rt Radial aline 05/08>>out R IJ HD cath 5/12 >> 5/13 (inadvertent removal) R IJ HD cath 5/13 >>5/24 Tunneled HD cath 5/24>>   5/08 Doppler legs>>negative for DVT 5/12 Echo>>EF 15 to 20%, mild LVH, diffuse hypokinesis, mild MR, mod/severe LA dilation, mod RV systolic dysfx, PAS 52 mmHg 5/23 Doppler legs>>negative for DVT  Shock>>likely septic and cardiogenic.  Resolved.  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 Plan: -continue amiodarone per cardiology>>plan  to change to po   RENAL  Acute on chronic renal failure with hx of renal transplant.  CVVH started 5/12>>stopped 5/26. Plan: -HD per renal -continue sirolimus, prednisone   ID  Blood 5/23>> C diff 5/23>>negative  Vancomycin 5/23>> 5/28  Parainfluenza pneumonia.  Given IVIG 5/15.  Ribavarin started 5/16, and completed 7 day course. Plan: -monitor clinically off therapy  Cellulitis. -Vancomycin per ID  Fever.  No recurrence since 5/22.   -If persists may need CT abd/pelvis, and broaden Abx  Tooth pain. -will need to call for dental eval 5/28   HEMATOLOGY  Anemia of critical illness, chronic disease, and renal failure. Plan: -f/u CBC -transfuse for Hb < 7 -continue nulecit, aranesp per renal  Leukocytosis>>improving.  Thrombocytopenia. Plan: -f/u CBC   ENDOCRINE  Steroid induced hyperglycemia. Plan: -improved, monitor blood sugar on BMET   NEUROLOGY  Mild agitation.  Improved. Plan: -monitor mental status   GASTROENTEROLOGY  Nurtition. Plan: -dc tube feeds -swallow eval - regular , thin liquids -? Dc flexiseal  Hx of Hepatitis C. Plan: -f/u LFT intermittently   BEST PRACTICE -BID protonix for SUP -SCD for DVT prophylaxis -Full code -PT/OT  -keep in SDU, PCCM service  [facility] V.  [phone] 06/24/2011, 11:08 AM
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
## 2011-06-25 ENCOUNTER — Inpatient Hospital Stay (HOSPITAL_COMMUNITY): Payer: BC Managed Care – PPO

## 2011-06-25 DIAGNOSIS — I059 Rheumatic mitral valve disease, unspecified: Secondary | ICD-10-CM

## 2011-06-25 LAB — RENAL FUNCTION PANEL
CO2: 23 mEq/L (ref 19–32)
Calcium: 8.8 mg/dL (ref 8.4–10.5)
Chloride: 93 mEq/L — ABNORMAL LOW (ref 96–112)
GFR calc Af Amer: 15 mL/min — ABNORMAL LOW (ref >90)
GFR calc non Af Amer: 13 mL/min — ABNORMAL LOW (ref >90)
Sodium: 130 mEq/L — ABNORMAL LOW (ref 135–145)

## 2011-06-25 LAB — CBC
Hemoglobin: 8.9 g/dL — ABNORMAL LOW (ref 13.0–17.0)
MCHC: 30.6 g/dL (ref 30.0–36.0)
Platelets: 73 10*3/uL — ABNORMAL LOW (ref 150–400)

## 2011-06-25 LAB — CULTURE, BLOOD (ROUTINE X 2)
Culture  Setup Time: 201305231444
Culture: NO GROWTH

## 2011-06-25 MED ORDER — HYDROCODONE-ACETAMINOPHEN 5-325 MG PO TABS
1.0000 | ORAL_TABLET | Freq: Once | ORAL | Status: AC
  Administered 2011-06-25: 1 via ORAL
  Filled 2011-06-25: qty 1

## 2011-06-25 MED ORDER — HYDROCODONE-ACETAMINOPHEN 5-325 MG PO TABS
1.0000 | ORAL_TABLET | Freq: Three times a day (TID) | ORAL | Status: DC | PRN
  Administered 2011-06-25 – 2011-06-26 (×5): 1 via ORAL
  Filled 2011-06-25 (×5): qty 1

## 2011-06-25 NOTE — Procedures (Signed)
Trach change  Removed size 8 cuffed, without force, Clean wound, no pus or erythema. Placed 6 cuffless Pos cap, equal BS Good air entry then PMV  sats 100% Mcarthur Rossetti. Tyson Alias, MD, FACP Pgr: [phone] [facility]

## 2011-06-25 NOTE — Progress Notes (Signed)
Respiratory Therapy- Orders to downsize trach. Patient was positioned correctly with all emergency equipment at bedside. Protection barrier was removed with strong odor coming from stoma site under the flang of trach. Packing material noted as well. RN notified and recommended that CCM and possibly wound therapy be contacted before changing trach at this time.

## 2011-06-25 NOTE — Progress Notes (Signed)
[facility] ROUNDING NOTE   Subjective:   Interval History: none.  Objective:  Vital signs in last 24 hours:  Temp:  [97.4 F (36.3 C)-99 F (37.2 C)] 98.1 F (36.7 C) (05/29 0805) Pulse Rate:  [74-82] 80  (05/29 0836) Resp:  [19-27] 21  (05/29 0836) BP: (119-137)/(65-88) 125/86 mmHg (05/29 0836) SpO2:  [99 %-100 %] 100 % (05/29 0836) FiO2 (%):  [28 %] 28 % (05/29 0836)  Weight change:  Filed Weights   06/22/11 0400 06/23/11 0500 06/24/11 0005  Weight: 102.8 kg (226 lb 10.1 oz) 106 kg (233 lb 11 oz) 106.6 kg (235 lb 0.2 oz)    Intake/Output: I/O last 3 completed shifts: In: 1560.4 [P.O.:600; I.V.:370.4; NG/GT:590] Out: 2440 [Urine:1640; Stool:800]   Intake/Output this shift:  Total I/O In: 260 [P.O.:240; I.V.:20] Out: -   CVS- RRR RS- CTA ABD- BS present soft non-distended EXT- no edema   Basic Metabolic Panel:  Lab 06/25/11 4098 06/24/11 0430 06/23/11 0429 06/22/11 0411 06/21/11 0430 06/20/11 0500 06/19/11 0435  NA 130* 137 140 134* 138 -- --  K 5.7* 5.0 4.8 4.0 3.6 -- --  CL 93* 100 104 99 102 -- --  CO2 23 23 24 25 25  -- --  GLUCOSE 83 112* 95 97 92 -- --  BUN 94* 85* 60* 30* 34* -- --  CREATININE 4.69* 4.11* 2.89* 1.29 1.45* -- --  CALCIUM 8.8 8.7 8.7 -- -- -- --  MG -- -- -- 2.5 2.4 2.4 2.2  PHOS 7.5* 6.8* 5.6* 2.3 2.6 -- --    Liver Function Tests:  Lab 06/25/11 0818 06/24/11 0430 06/23/11 0429 06/22/11 0411 06/21/11 0430  AST -- -- -- 154* --  ALT -- -- -- 61* --  ALKPHOS -- -- -- 259* --  BILITOT -- -- -- 2.2* --  PROT -- -- -- 8.9* --  ALBUMIN 2.0* 2.0* 2.1* 2.2*2.3* 2.1*   No results found for this basename: LIPASE:5,AMYLASE:5 in the last 168 hours No results found for this basename: AMMONIA:3 in the last 168 hours  CBC:  Lab 06/25/11 0818 06/24/11 0430 06/23/11 0429 06/22/11 0411 06/21/11 0430 06/18/11 1545  WBC 14.0* 17.9* 19.7* 17.3* 17.7* --  NEUTROABS -- -- -- -- -- 18.9*  HGB 8.9* 8.5* 8.8* 9.2* 8.5* --  HCT  29.1* 28.7* 30.8* 32.4* 29.7* --  MCV 107.0* 111.2* 112.8* 112.5* 109.2* --  PLT 73* 72* 85* 92* 86* --    Cardiac Enzymes: No results found for this basename: CKTOTAL:5,CKMB:5,CKMBINDEX:5,TROPONINI:5 in the last 168 hours  BNP: No components found with this basename: POCBNP:5  CBG: No results found for this basename: GLUCAP:5 in the last 168 hours  Microbiology: Results for orders placed during the hospital encounter of 06/03/11  URINE CULTURE     Status: Normal   Collection Time   06/03/11 10:53 PM      Component Value Range Status Comment   Specimen Description URINE, RANDOM   Final    Special Requests ADDED [id number]   Final    Culture  Setup Time [id number]   Final    Colony Count >=100,000 COLONIES/ML   Final    Culture     Final    Value: Multiple bacterial morphotypes present, none predominant. Suggest appropriate recollection if clinically indicated.   Report Status 06/05/2011 FINAL   Final   CULTURE, BLOOD (ROUTINE X 2)     Status: Normal   Collection Time   06/03/11 11:45 PM  Component Value Range Status Comment   Specimen Description BLOOD RIGHT ARM   Final    Special Requests BOTTLES DRAWN AEROBIC AND ANAEROBIC 10CC   Final    Culture  Setup Time [id number]   Final    Culture NO GROWTH 5 DAYS   Final    Report Status 06/10/2011 FINAL   Final   CULTURE, BLOOD (ROUTINE X 2)     Status: Normal   Collection Time   06/03/11 11:50 PM      Component Value Range Status Comment   Specimen Description BLOOD RIGHT ARM   Final    Special Requests     Final    Value: BOTTLES DRAWN AEROBIC AND ANAEROBIC 5CC AEROBOC 3CC ANAEROBIC   Culture  Setup Time [id number]   Final    Culture NO GROWTH 5 DAYS   Final    Report Status 06/10/2011 FINAL   Final   MRSA PCR SCREENING     Status: Normal   Collection Time   06/04/11  3:08 AM      Component Value Range Status Comment   MRSA by PCR NEGATIVE  NEGATIVE  Final   RESPIRATORY VIRUS PANEL (18 COMPONENTS)     Status:  Abnormal   Collection Time   06/04/11  6:09 AM      Component Value Range Status Comment   Source - RVPAN NASAL MUCOSA   Final    Respiratory Syncytial Virus A NOT DETECTED   Final    Respiratory Syncytial Virus B NOT DETECTED   Final    Influenza A NOT DETECTED   Final    Influenza B NOT DETECTED   Final    Parainfluenza 1 NOT DETECTED   Final    Parainfluenza 2 NOT DETECTED   Final    Parainfluenza 3 DETECTED (*)  Final    Parainfluenza 4 NOT DETECTED   Final    Metapneumovirus NOT DETECTED   Final    Coxsackie and Echovirus NOT DETECTED   Final    Rhinovirus NOT DETECTED   Final    Adenovirus B NOT DETECTED   Final    Adenovirus E NOT DETECTED   Final    CoronavirusNL63 NOT DETECTED   Final    CoronavirusHKU1 NOT DETECTED   Final    Coronavirus229E NOT DETECTED   Final    CoronavirusOC43 NOT DETECTED   Final   CULTURE, EXPECTORATED SPUTUM-ASSESSMENT     Status: Normal   Collection Time   06/04/11  7:48 AM      Component Value Range Status Comment   Specimen Description SPUTUM   Final    Special Requests NONE   Final    Sputum evaluation     Final    Value: MICROSCOPIC FINDINGS SUGGEST THAT THIS SPECIMEN IS NOT REPRESENTATIVE OF LOWER RESPIRATORY SECRETIONS. PLEASE RECOLLECT.     CALLED TO D ORTIZ,RN 06/04/11 0911 BY K SCHULTZ   Report Status 06/04/2011 FINAL   Final   AFB CULTURE WITH SMEAR     Status: Normal (Preliminary result)   Collection Time   06/04/11 10:30 AM      Component Value Range Status Comment   Specimen Description BRONCHIAL WASHINGS   Final    Special Requests NONE   Final    ACID FAST SMEAR NO ACID FAST BACILLI SEEN   Final    Culture     Final    Value: CULTURE WILL BE EXAMINED FOR 6 WEEKS BEFORE ISSUING A FINAL REPORT  Report Status PENDING   Incomplete   PNEUMOCYSTIS JIROVECI SMEAR BY DFA     Status: Normal   Collection Time   06/04/11 10:30 AM      Component Value Range Status Comment   Specimen Source-PJSRC BRONCHIAL WASHINGS   Final    Pneumocystis  jiroveci Ag NEGATIVE   Final Performed at [facility]  FUNGUS CULTURE W SMEAR     Status: Normal (Preliminary result)   Collection Time   06/04/11 10:30 AM      Component Value Range Status Comment   Specimen Description BRONCHIAL WASHINGS   Final    Special Requests NONE   Final    Fungal Smear NO YEAST OR FUNGAL ELEMENTS SEEN   Final    Culture CULTURE IN PROGRESS FOR FOUR WEEKS   Final    Report Status PENDING   Incomplete   CULTURE, RESPIRATORY     Status: Normal   Collection Time   06/04/11 10:30 AM      Component Value Range Status Comment   Specimen Description BRONCHIAL WASHINGS   Final    Special Requests NONE   Final    Gram Stain     Final    Value: FEW WBC PRESENT,BOTH PMN AND MONONUCLEAR     NO SQUAMOUS EPITHELIAL CELLS SEEN     NO ORGANISMS SEEN   Culture NO GROWTH 2 DAYS   Final    Report Status 06/07/2011 FINAL   Final   HSV PCR     Status: Normal   Collection Time   06/04/11 10:30 AM      Component Value Range Status Comment   HSV, PCR Not Detected  Not Detected  Final    HSV 2 , PCR Not Detected  Not Detected  Final    Specimen Source-HSVPCR NO GROWTH   Final   CULTURE, BLOOD (ROUTINE X 2)     Status: Normal   Collection Time   06/04/11  3:15 PM      Component Value Range Status Comment   Specimen Description BLOOD RIGHT HAND   Final    Special Requests BOTTLES DRAWN AEROBIC AND ANAEROBIC 10CC   Final    Culture  Setup Time [id number]   Final    Culture NO GROWTH 5 DAYS   Final    Report Status 06/11/2011 FINAL   Final   CULTURE, BLOOD (ROUTINE X 2)     Status: Normal   Collection Time   06/04/11  3:27 PM      Component Value Range Status Comment   Specimen Description BLOOD LEFT HAND   Final    Special Requests BOTTLES DRAWN AEROBIC ONLY 3CC   Final    Culture  Setup Time [id number]   Final    Culture NO GROWTH 5 DAYS   Final    Report Status 06/11/2011 FINAL   Final   CULTURE, BLOOD (ROUTINE X 2)     Status: Normal   Collection Time    06/08/11  4:15 AM      Component Value Range Status Comment   Specimen Description BLOOD LEFT HAND   Final    Special Requests BOTTLES DRAWN AEROBIC ONLY 10CC   Final    Culture  Setup Time [id number]   Final    Culture NO GROWTH 5 DAYS   Final    Report Status 06/14/2011 FINAL   Final   CULTURE, BLOOD (ROUTINE X 2)     Status: Normal  Collection Time   06/08/11  4:20 AM      Component Value Range Status Comment   Specimen Description BLOOD LEFT HAND   Final    Special Requests BOTTLES DRAWN AEROBIC ONLY 10CC   Final    Culture  Setup Time [id number]   Final    Culture NO GROWTH 5 DAYS   Final    Report Status 06/14/2011 FINAL   Final   RESPIRATORY VIRUS PANEL (18 COMPONENTS)     Status: Abnormal   Collection Time   06/11/11  8:33 PM      Component Value Range Status Comment   Source - RVPAN BRONCHIAL ALVEOLAR LAVAGE   Final    Respiratory Syncytial Virus A NOT DETECTED   Final    Respiratory Syncytial Virus B NOT DETECTED   Final    Influenza A NOT DETECTED   Final    Influenza B NOT DETECTED   Final    Parainfluenza 1 NOT DETECTED   Final    Parainfluenza 2 NOT DETECTED   Final    Parainfluenza 3 DETECTED (*)  Final    Parainfluenza 4 NOT DETECTED   Final    Metapneumovirus NOT DETECTED   Final    Coxsackie and Echovirus NOT DETECTED   Final    Rhinovirus NOT DETECTED   Final    Adenovirus B NOT DETECTED   Final    Adenovirus E NOT DETECTED   Final    CoronavirusNL63 NOT DETECTED   Final    CoronavirusHKU1 NOT DETECTED   Final    Coronavirus229E NOT DETECTED   Final    CoronavirusOC43 NOT DETECTED   Final   CLOSTRIDIUM DIFFICILE BY PCR     Status: Normal   Collection Time   06/13/11  1:14 PM      Component Value Range Status Comment   C difficile by pcr NEGATIVE  NEGATIVE  Final   CLOSTRIDIUM DIFFICILE BY PCR     Status: Normal   Collection Time   06/19/11 10:25 AM      Component Value Range Status Comment   C difficile by pcr NEGATIVE  NEGATIVE  Final   CULTURE,  BLOOD (ROUTINE X 2)     Status: Normal   Collection Time   06/19/11 10:57 AM      Component Value Range Status Comment   Specimen Description BLOOD LEFT ARM   Final    Special Requests BOTTLES DRAWN AEROBIC AND ANAEROBIC 10CC   Final    Culture  Setup Time [id number]   Final    Culture NO GROWTH 5 DAYS   Final    Report Status 06/25/2011 FINAL   Final   CULTURE, BLOOD (ROUTINE X 2)     Status: Normal   Collection Time   06/19/11 10:58 AM      Component Value Range Status Comment   Specimen Description BLOOD LEFT HAND   Final    Special Requests BOTTLES DRAWN AEROBIC AND ANAEROBIC 10CC   Final    Culture  Setup Time [id number]   Final    Culture NO GROWTH 5 DAYS   Final    Report Status 06/25/2011 FINAL   Final     Coagulation Studies: No results found for this basename: LABPROT:5,INR:5 in the last 72 hours  Urinalysis: No results found for this basename: COLORURINE:2,APPERANCEUR:2,LABSPEC:2,PHURINE:2,GLUCOSEU:2,HGBUR:2,BILIRUBINUR:2,KETONESUR:2,PROTEINUR:2,UROBILINOGEN:2,NITRITE:2,LEUKOCYTESUR:2 in the last 72 hours    Imaging: Dg Swallowing Func-no Report  06/24/2011  CLINICAL DATA: dysphagia   FLUOROSCOPY FOR SWALLOWING FUNCTION STUDY:  Fluoroscopy was provided for swallowing function study, which was  administered by a speech pathologist.  Final results and recommendations  from this study are contained within the speech pathology report.       Medications:      . sodium chloride 10 mL/hr at 06/23/11 0043      . amiodarone  200 mg Oral Daily  . antiseptic oral rinse  15 mL Mouth Rinse QID  . chlorhexidine  15 mL Mouth Rinse BID  . collagenase   Topical Daily  . darbepoetin (ARANESP) injection - DIALYSIS  100 mcg Intravenous Q Wed-HD  . ferric gluconate (FERRLECIT/NULECIT) IV  125 mg Intravenous Daily  . HYDROcodone-acetaminophen  1-2 tablet Oral Once  . pantoprazole  40 mg Oral BID  . predniSONE  10 mg Per Tube Q breakfast  . sirolimus  0.75 mg Oral Daily  .  DISCONTD: pantoprazole sodium  40 mg Per Tube BID  . DISCONTD: potassium chloride  40 mEq Oral Daily   albuterol, fentaNYL, HYDROcodone-acetaminophen, metoprolol, ondansetron (ZOFRAN) IV  Assessment/ Plan:  is a 56 y.o. yo male s/p renal transplant who was admitted on 06/03/2011 with PNA pulm HTN, volume overload now with acute on chronic was dialysis  1. PNA/parainfluenza/cardiomyopathy/hypotension- completed RX for paraflu. Has trach and requiring vent support. Slow imp in CXR BP stable but soft no pressors 2. AKI, oliguric, HD dependent with CRRT 5/13- Will stop CRRT today. Has had excellent volume removal and has some mild LE edema left, but not a lot. No pulm edema. Using new R SCV tunneled HD cath now, and temporary HD cath has been removed. Do intermittent HD as needed.  3. 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 He is oliguric. Blood sirolimus level on 5/8 at 5 pm was 2.0  4. Anemia- hgb stable. Aranesp. Iron. 5. Secondary hyperparathyroidism- no treatment right now, rocaltrol stopped - not essential at this time and calcium up 6. PNA parainfluenza- finished course Watching for recovery Creatinine is worse . Hyperkalemia was on supplements now stopped  Will check renal function in AM      LOS: 22 Berdell Hostetler W @TODAY @11 :36 AM

## 2011-06-25 NOTE — Progress Notes (Signed)
  Echocardiogram 2D Echocardiogram (limited) has been performed.  Chad [name] [facility] 06/25/2011, 11:29 AM

## 2011-06-25 NOTE — Progress Notes (Signed)
S/P diatek catheter placement. No erythema, no signs of infection. Creat/BUN are within normal limits today.  He is not currently on dialysis. Chad [name] Chad [name]

## 2011-06-25 NOTE — Progress Notes (Signed)
Speech Language Pathology Dysphagia Treatment Patient Details Name: Chad [name] MRN: [id number] DOB: 1956-01-11 Today's Date: 06/25/2011 Time: 1000-1015 SLP Time Calculation (min): 15 min  Assessment / Plan / Recommendation Clinical Impression  Pt verbalized strategies, demonstrated with 100% accuracy. Tolerating valve with 100% O2, has worn independently since early this am. Pt reluctant to participate in more extensive therapy session due to pain from tooth. Dentist will see today. SLp reiterated strategies and precautions, no signs of aspiration observed.     Diet Recommendation  Continue with Current Diet: Regular;Thin liquid    SLP Plan Continue with current plan of care   Pertinent Vitals/Pain NA   Swallowing Goals  SLP Swallowing Goals Patient will consume recommended diet without observed clinical signs of aspiration with: Modified independent assistance Swallow Study Goal #1 - Progress: Progressing toward goal Patient will utilize recommended strategies during swallow to increase swallowing safety with: Modified independent assistance Swallow Study Goal #2 - Progress: Progressing toward goal Passy Muir goals Pt will wear PMSV during all waking hours with intermittent supervision with no significant change in vital signs. SLP Goal #1  Met  SLP Goal #2 Pt will demonstrate independence with removal and placement of valve. SLP Goal #2 -Progressing toward goal SLP Goal #3 Pt will phonate at conversation level with normal volume with min verbal cues for 15 minutes. Met  General Temperature Spikes Noted: No Respiratory Status: Trach Behavior/Cognition: Alert;Cooperative;Pleasant mood Oral Cavity - Dentition: Adequate natural dentition Patient Positioning: Upright in chair  Oral Cavity - Oral Hygiene Does patient have any of the following "at risk" factors?: Oxygen therapy - cannula, mask, simple oxygen devices Patient is AT RISK - Oral Care Protocol followed (see row info):  Yes   Dysphagia Treatment Treatment focused on: Skilled observation of diet tolerance;Utilization of compensatory strategies Treatment Methods/Modalities: Skilled observation Patient observed directly with PO's: Yes Type of PO's observed: Thin liquids Feeding: Able to feed self Liquids provided via: Cup Type of cueing: Verbal Amount of cueing: Minimal   Chad [name], Chad [name] 06/25/2011, 10:23 AM

## 2011-06-25 NOTE — Progress Notes (Signed)
Occupational Therapy Treatment Patient Details Name: Chad [name] MRN: [id number] DOB: Feb 06, 1955 Today's Date: 06/25/2011 Time: 4098-1191 OT Time Calculation (min): 32 min  OT Assessment / Plan / Recommendation Comments on Treatment Session Pt limited this session by dental pain and fatigue. RN stated pt had been up in chair all night long. Pt had difficulty tolerating the Sera lift stating it hurt his arms.    Follow Up Recommendations  Inpatient Rehab    Barriers to Discharge       Equipment Recommendations  Defer to next venue    Recommendations for Other Services Rehab consult  Frequency Min 2X/week   Plan Discharge plan remains appropriate    Precautions / Restrictions Precautions Precautions: Fall   Pertinent Vitals/Pain Reported 9/10 dental pain    ADL  Toilet Transfer: +2 Total assistance Toilet Transfer: Patient Percentage: 30% Toilet Transfer Method: Other (comment) (Utilized the Sera lift.) ADL Comments: Upon arrival RN stated that pt had been up in chair all night and they were unable to mobilize him back to bed.    OT Diagnosis:    OT Problem List:   OT Treatment Interventions:     OT Goals ADL Goals ADL Goal: Toilet Transfer - Progress: Not progressing  Visit Information  Last OT Received On: 06/25/11 Assistance Needed: +2    Subjective Data  Subjective: My tooth hurts   Prior Functioning       Cognition  Overall Cognitive Status: Appears within functional limits for tasks assessed/performed Arousal/Alertness: Awake/alert Orientation Level: Appears intact for tasks assessed Behavior During Session: [name] for tasks performed    Mobility Bed Mobility Bed Mobility: Sit to Supine Sit to Supine: 1: +2 Total assist;With rail;HOB flat Sit to Supine: Patient Percentage: 50% Details for Bed Mobility Assistance: physical A needed for trunk and LEs. Transfers Sit to Stand: 1: +2 Total assist;With upper extremity assist;From chair/3-in-1 Sit to  Stand: Patient Percentage: 20% Stand to Sit: 1: +2 Total assist;To bed;With upper extremity assist Stand to Sit: Patient Percentage: 20% Transfer via Lift Equipment: Hydrographic surveyor Details for Transfer Assistance: Attempted x 2 sit>stand from the chair. Pt barely able to clear buttocks from the bed. Utilized Sera Lift.   Exercises    Balance Static Sitting Balance Static Sitting - Balance Support: Bilateral upper extremity supported;Feet supported Static Sitting - Level of Assistance: 5: Stand by assistance  End of Session OT - End of Session Equipment Utilized During Treatment: Gait belt Activity Tolerance: Patient limited by fatigue;Patient limited by pain Patient left: in chair;with call bell/phone within reach   Chad [name] A OTR/L [phone] 06/25/2011, 1:00 PM

## 2011-06-25 NOTE — Progress Notes (Addendum)
ASSName: ASHANTI LITTLES MRN: [id number] DOB: [birth year]    LOS: 22  PCCM  NOTE  History of Present Illness:  56 yo male admitted 06/03/2011 with cough, dyspnea and fever.  Progressed to [facility] 05/08 and PCCM consulted.  Has hx of renal transplant. PMHx Hep C, HTN, Gout  Cultures: BAL bronch 5/8>>>neg    AFB >> neg   Viral panel >> POS parainfluenza virus   Pneumocystis >> neg   Fungus >> neg   HSV >> neg BC 5/7>>>neg  Blood X 2 5/12 >> neg  Antibiotics: (per ID) 5/7 levoflox>>>5/10 5/7 bactrim >>>5/10 5/7 micafungin >> 5/14 5/7 vanc >> 5/10 5/10 cefepime >> 5/15 5/15 Ribaviran/IVIG >>Completed  Tests / Events: 5/8 hypoxic resp failure, intubated, near arrest 5/11 worsening shock , on sepsis protocol + dobut for low co-ox 5/15 Family mtg: continue full aggressive support but NCB if arrests. To reassess in 4-5 days. If no progress, consider withdrawal at that time 5/15 Trial of Ribaviran/IVIG 5/16 Markedly improved - reduced pressors, improved cognition, gas exchange improved 5/17 continued improvement. Tolerates PS 5 cm H2O with Vt > 800cc 5/17- extubated, re intubated 5/20- trach 5/24 Tunneled HD cath placed 5/26 CRRT stopped 5/27 Speech valve fitted. 5/8 diet - regular, thin 5/28 Dental consult called  Subjective: Tolerating trach collar.  Speaking with speech valve.  C/o tooth pain left upper molar.  Vital Signs: Temp:  [97.4 F (36.3 C)-99 F (37.2 C)] 98.1 F (36.7 C) (05/29 0805) Pulse Rate:  [74-82] 80  (05/29 0836) Resp:  [19-27] 21  (05/29 0836) BP: (119-137)/(65-88) 125/86 mmHg (05/29 0836) SpO2:  [95 %-100 %] 100 % (05/29 0836) FiO2 (%):  [28 %] 28 % (05/29 0836) I/O last 3 completed shifts: In: 1560.4 [P.O.:600; I.V.:370.4; NG/GT:590] Out: 2440 [Urine:1640; Stool:800]   Physical Examination: General - no distress HEENT - trach site clean, PANDA in place. Left upper molar blackened root Cardiac - s1s2 regular, no murmur Chest - scattered  rhonchi Abd - soft, non tender, +bowel sounds Ext - 2+edema Neuro - alert, follows commands, moves all extremities   Ventilator settings: Vent Mode:  [-]  FiO2 (%):  [28 %] 28 %   Lab 06/25/11 0818 06/24/11 0430 06/23/11 0429  NA 130* 137 140  K 5.7* 5.0 4.8  CL 93* 100 104  CO2 23 23 24   BUN 94* 85* 60*  CREATININE 4.69* 4.11* 2.89*  GLUCOSE 83 112* 95    Lab 06/25/11 0818 06/24/11 0430 06/23/11 0429  HGB 8.9* 8.5* 8.8*  HCT 29.1* 28.7* 30.8*  WBC 14.0* 17.9* 19.7*  PLT 73* 72* 85*        Assessment and Plan:  PULMONARY  Bronchoscopy 05/08>>diffuse, thick secretions ETT 5/8 >>5/17>>>retubed 6 hrs>>>5/20 Trach 5/20 (df)>>>  Acute respiratory failure 2nd to pneumonia, ARDS.  Required trach 05/20. Plan: -f/u CXR intermittently, last 5/26 - lt basilar ASD -trach collar as tolerated>> -speech valve fitted - downsize trach    CARDIAC Lt IJ CVL 05/08>>5/28 Rt Radial aline 05/08>>out R IJ HD cath 5/12 >> 5/13 (inadvertent removal) R IJ HD cath 5/13 >>5/24 Tunneled HD cath 5/24>>   5/08 Doppler legs>>negative for DVT 5/12 Echo>>EF 15 to 20%, mild LVH, diffuse hypokinesis, mild MR, mod/severe LA dilation, mod RV systolic dysfx, PAS 52 mmHg 5/23 Doppler legs>>negative for DVT  Shock>>likely septic and cardiogenic.  Resolved.  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 Plan: -continue po amiodarone per cardiology  RENAL  Acute on chronic renal failure with hx of renal transplant.  CVVH started 5/12>>stopped 5/26. Plan: -HD per renal -continue sirolimus, prednisone -dc potassium supplement   ID  Blood 5/23>> C diff 5/23>>negative  Vancomycin 5/23>> 5/28  Parainfluenza pneumonia.  Given IVIG 5/15.  Ribavarin started 5/16, and completed 7 day course. Plan: -monitor clinically off therapy  Cellulitis. -off abx per ID  Fever.  No recurrence since 5/22.   -If persists may need  CT abd/pelvis, and broaden Abx  Tooth pain. -await dental eval    HEMATOLOGY  Basename 06/25/11 0818 06/24/11 0430  HGB 8.9* 8.5*   Anemia of critical illness, chronic disease, and renal failure. Plan: -f/u CBC -transfuse for Hb < 7 -continue nulecit, aranesp per renal  Leukocytosis>>improving.  Thrombocytopenia. Plan: -f/u CBC   ENDOCRINE  Steroid induced hyperglycemia. CBG (last 3)  No results found for this basename: GLUCAP:3 in the last 72 hours   Plan: -improved, monitor blood sugar on BMET   GASTROENTEROLOGY  Nurtition. Plan: -dc tube feeds -swallow eval - regular , thin liquids -? Dc flexiseal  Hx of Hepatitis C. Plan: -f/u LFT intermittently  DISPO - accepted to rehab, awaiting dental input to decide  [facility] -BID protonix for SUP -SCD for DVT prophylaxis -Full code -PT/OT  -keep in SDU, PCCM service  MINOR, Chrissie Noa  Independently examined pt, evaluated data & formulated above care plan with NP [facility]   [phone] 06/25/2011, 9:43 AM

## 2011-06-26 ENCOUNTER — Inpatient Hospital Stay (HOSPITAL_COMMUNITY): Payer: BC Managed Care – PPO

## 2011-06-26 ENCOUNTER — Encounter (HOSPITAL_COMMUNITY): Payer: Self-pay | Admitting: Dentistry

## 2011-06-26 DIAGNOSIS — Z930 Tracheostomy status: Secondary | ICD-10-CM

## 2011-06-26 DIAGNOSIS — J9600 Acute respiratory failure, unspecified whether with hypoxia or hypercapnia: Secondary | ICD-10-CM

## 2011-06-26 DIAGNOSIS — I428 Other cardiomyopathies: Secondary | ICD-10-CM

## 2011-06-26 DIAGNOSIS — K0401 Reversible pulpitis: Secondary | ICD-10-CM | POA: Diagnosis not present

## 2011-06-26 LAB — RENAL FUNCTION PANEL
Albumin: 1.9 g/dL — ABNORMAL LOW (ref 3.5–5.2)
GFR calc Af Amer: 14 mL/min — ABNORMAL LOW (ref >90)
GFR calc non Af Amer: 12 mL/min — ABNORMAL LOW (ref >90)
Glucose, Bld: 106 mg/dL — ABNORMAL HIGH (ref 70–99)
Phosphorus: 7.7 mg/dL — ABNORMAL HIGH (ref 2.3–4.6)
Potassium: 5.6 mEq/L — ABNORMAL HIGH (ref 3.5–5.1)
Sodium: 129 mEq/L — ABNORMAL LOW (ref 135–145)

## 2011-06-26 LAB — HEPATITIS B SURFACE ANTIBODY,QUALITATIVE: Hep B S Ab: POSITIVE — AB

## 2011-06-26 LAB — CBC
MCV: 105.2 fL — ABNORMAL HIGH (ref 78.0–100.0)
Platelets: 91 10*3/uL — ABNORMAL LOW (ref 150–400)
RBC: 2.51 MIL/uL — ABNORMAL LOW (ref 4.22–5.81)
RDW: 22.9 % — ABNORMAL HIGH (ref 11.5–15.5)
WBC: 8.8 10*3/uL (ref 4.0–10.5)

## 2011-06-26 LAB — HEPATITIS B SURFACE ANTIGEN: Hepatitis B Surface Ag: NEGATIVE

## 2011-06-26 MED ORDER — NEPRO/CARBSTEADY PO LIQD: 237.0000 mL | Freq: Every day | ORAL | Status: DC | PRN

## 2011-06-26 MED ORDER — DARBEPOETIN ALFA-POLYSORBATE 100 MCG/0.5ML IJ SOLN
INTRAMUSCULAR | Status: AC
  Filled 2011-06-26: qty 0.5

## 2011-06-26 MED ORDER — CEFAZOLIN SODIUM-DEXTROSE 2-3 GM-% IV SOLR
2.0000 g | INTRAVENOUS | Status: DC
  Filled 2011-06-26 (×3): qty 50

## 2011-06-26 MED ORDER — ACETAMINOPHEN 325 MG PO TABS
ORAL_TABLET | ORAL | Status: AC
  Filled 2011-06-26: qty 2

## 2011-06-26 MED ORDER — DARBEPOETIN ALFA-POLYSORBATE 100 MCG/0.5ML IJ SOLN
100.0000 ug | INTRAMUSCULAR | Status: DC
  Administered 2011-06-26: 100 ug via INTRAVENOUS
  Filled 2011-06-26: qty 0.5

## 2011-06-26 MED ORDER — ACETAMINOPHEN 325 MG PO TABS
650.0000 mg | ORAL_TABLET | Freq: Once | ORAL | Status: AC
  Administered 2011-06-26: 650 mg via ORAL

## 2011-06-26 NOTE — Progress Notes (Signed)
LM with arrival time, NPO after MN, meds to take with sips water.

## 2011-06-26 NOTE — Progress Notes (Signed)
PT Cancellation Note  Treatment cancelled today due to medical issues with patient which prohibited therapy  Patient in HD.  [name],Zenaya Ulatowski 06/26/2011, 11:20 AM  [facility] [phone] (pager)

## 2011-06-26 NOTE — Procedures (Signed)
I have seen and examined this patient and agree with the plan of care on dialysis . No issues Chelcea Zahn W 06/26/2011, 9:10 AM

## 2011-06-26 NOTE — Progress Notes (Signed)
[facility] ROUNDING NOTE   Subjective:   Interval History: none.  Objective:  Vital signs in last 24 hours:  Temp:  [97.6 F (36.4 C)-98.6 F (37 C)] 98.3 F (36.8 C) (05/30 0503) Pulse Rate:  [77-91] 88  (05/30 0503) Resp:  [18-28] 23  (05/30 0503) BP: (117-128)/(68-86) 120/79 mmHg (05/30 0503) SpO2:  [98 %-100 %] 100 % (05/30 0503) FiO2 (%):  [28 %] 28 % (05/30 0503) Weight:  [113 kg (249 lb 1.9 oz)] 113 kg (249 lb 1.9 oz) (05/30 0100)  Weight change:  Filed Weights   06/23/11 0500 06/24/11 0005 06/26/11 0100  Weight: 106 kg (233 lb 11 oz) 106.6 kg (235 lb 0.2 oz) 113 kg (249 lb 1.9 oz)    Intake/Output: I/O last 3 completed shifts: In: 1775 [P.O.:1200; I.V.:190; Other:275; IV Piggyback:110] Out: 2200 [Urine:2200]   Intake/Output this shift:     CVS- RRR RS- CTA ABD- BS present soft non-distended EXT- no edema   Basic Metabolic Panel:  Lab 06/26/11 1610 06/25/11 0818 06/24/11 0430 06/23/11 0429 06/22/11 0411 06/21/11 0430 06/20/11 0500  NA 129* 130* 137 140 134* -- --  K 5.6* 5.7* 5.0 4.8 4.0 -- --  CL 95* 93* 100 104 99 -- --  CO2 19 23 23 24 25  -- --  GLUCOSE 106* 83 112* 95 97 -- --  BUN 97* 94* 85* 60* 30* -- --  CREATININE 4.83* 4.69* 4.11* 2.89* 1.29 -- --  CALCIUM 8.7 8.8 8.7 -- -- -- --  MG -- -- -- -- 2.5 2.4 2.4  PHOS 7.7* 7.5* 6.8* 5.6* 2.3 -- --    Liver Function Tests:  Lab 06/26/11 0420 06/25/11 0818 06/24/11 0430 06/23/11 0429 06/22/11 0411  AST -- -- -- -- 154*  ALT -- -- -- -- 61*  ALKPHOS -- -- -- -- 259*  BILITOT -- -- -- -- 2.2*  PROT -- -- -- -- 8.9*  ALBUMIN 1.9* 2.0* 2.0* 2.1* 2.2*2.3*   No results found for this basename: LIPASE:5,AMYLASE:5 in the last 168 hours No results found for this basename: AMMONIA:3 in the last 168 hours  CBC:  Lab 06/25/11 0818 06/24/11 0430 06/23/11 0429 06/22/11 0411 06/21/11 0430  WBC 14.0* 17.9* 19.7* 17.3* 17.7*  NEUTROABS -- -- -- -- --  HGB 8.9* 8.5* 8.8* 9.2* 8.5*    HCT 29.1* 28.7* 30.8* 32.4* 29.7*  MCV 107.0* 111.2* 112.8* 112.5* 109.2*  PLT 73* 72* 85* 92* 86*    Cardiac Enzymes: No results found for this basename: CKTOTAL:5,CKMB:5,CKMBINDEX:5,TROPONINI:5 in the last 168 hours  BNP: No components found with this basename: POCBNP:5  CBG: No results found for this basename: GLUCAP:5 in the last 168 hours  Microbiology: Results for orders placed during the hospital encounter of 06/03/11  URINE CULTURE     Status: Normal   Collection Time   06/03/11 10:53 PM      Component Value Range Status Comment   Specimen Description URINE, RANDOM   Final    Special Requests ADDED [id number]   Final    Culture  Setup Time [id number]   Final    Colony Count >=100,000 COLONIES/ML   Final    Culture     Final    Value: Multiple bacterial morphotypes present, none predominant. Suggest appropriate recollection if clinically indicated.   Report Status 06/05/2011 FINAL   Final   CULTURE, BLOOD (ROUTINE X 2)     Status: Normal   Collection Time   06/03/11 11:45 PM  Component Value Range Status Comment   Specimen Description BLOOD RIGHT ARM   Final    Special Requests BOTTLES DRAWN AEROBIC AND ANAEROBIC 10CC   Final    Culture  Setup Time [id number]   Final    Culture NO GROWTH 5 DAYS   Final    Report Status 06/10/2011 FINAL   Final   CULTURE, BLOOD (ROUTINE X 2)     Status: Normal   Collection Time   06/03/11 11:50 PM      Component Value Range Status Comment   Specimen Description BLOOD RIGHT ARM   Final    Special Requests     Final    Value: BOTTLES DRAWN AEROBIC AND ANAEROBIC 5CC AEROBOC 3CC ANAEROBIC   Culture  Setup Time [id number]   Final    Culture NO GROWTH 5 DAYS   Final    Report Status 06/10/2011 FINAL   Final   MRSA PCR SCREENING     Status: Normal   Collection Time   06/04/11  3:08 AM      Component Value Range Status Comment   MRSA by PCR NEGATIVE  NEGATIVE  Final   RESPIRATORY VIRUS PANEL (18 COMPONENTS)     Status: Abnormal    Collection Time   06/04/11  6:09 AM      Component Value Range Status Comment   Source - RVPAN NASAL MUCOSA   Final    Respiratory Syncytial Virus A NOT DETECTED   Final    Respiratory Syncytial Virus B NOT DETECTED   Final    Influenza A NOT DETECTED   Final    Influenza B NOT DETECTED   Final    Parainfluenza 1 NOT DETECTED   Final    Parainfluenza 2 NOT DETECTED   Final    Parainfluenza 3 DETECTED (*)  Final    Parainfluenza 4 NOT DETECTED   Final    Metapneumovirus NOT DETECTED   Final    Coxsackie and Echovirus NOT DETECTED   Final    Rhinovirus NOT DETECTED   Final    Adenovirus B NOT DETECTED   Final    Adenovirus E NOT DETECTED   Final    CoronavirusNL63 NOT DETECTED   Final    CoronavirusHKU1 NOT DETECTED   Final    Coronavirus229E NOT DETECTED   Final    CoronavirusOC43 NOT DETECTED   Final   CULTURE, EXPECTORATED SPUTUM-ASSESSMENT     Status: Normal   Collection Time   06/04/11  7:48 AM      Component Value Range Status Comment   Specimen Description SPUTUM   Final    Special Requests NONE   Final    Sputum evaluation     Final    Value: MICROSCOPIC FINDINGS SUGGEST THAT THIS SPECIMEN IS NOT REPRESENTATIVE OF LOWER RESPIRATORY SECRETIONS. PLEASE RECOLLECT.     CALLED TO D ORTIZ,RN 06/04/11 0911 BY K SCHULTZ   Report Status 06/04/2011 FINAL   Final   AFB CULTURE WITH SMEAR     Status: Normal (Preliminary result)   Collection Time   06/04/11 10:30 AM      Component Value Range Status Comment   Specimen Description BRONCHIAL WASHINGS   Final    Special Requests NONE   Final    ACID FAST SMEAR NO ACID FAST BACILLI SEEN   Final    Culture     Final    Value: CULTURE WILL BE EXAMINED FOR 6 WEEKS BEFORE ISSUING A FINAL REPORT  Report Status PENDING   Incomplete   PNEUMOCYSTIS JIROVECI SMEAR BY DFA     Status: Normal   Collection Time   06/04/11 10:30 AM      Component Value Range Status Comment   Specimen Source-PJSRC BRONCHIAL WASHINGS   Final    Pneumocystis jiroveci Ag  NEGATIVE   Final Performed at [facility]  FUNGUS CULTURE W SMEAR     Status: Normal (Preliminary result)   Collection Time   06/04/11 10:30 AM      Component Value Range Status Comment   Specimen Description BRONCHIAL WASHINGS   Final    Special Requests NONE   Final    Fungal Smear NO YEAST OR FUNGAL ELEMENTS SEEN   Final    Culture CULTURE IN PROGRESS FOR FOUR WEEKS   Final    Report Status PENDING   Incomplete   CULTURE, RESPIRATORY     Status: Normal   Collection Time   06/04/11 10:30 AM      Component Value Range Status Comment   Specimen Description BRONCHIAL WASHINGS   Final    Special Requests NONE   Final    Gram Stain     Final    Value: FEW WBC PRESENT,BOTH PMN AND MONONUCLEAR     NO SQUAMOUS EPITHELIAL CELLS SEEN     NO ORGANISMS SEEN   Culture NO GROWTH 2 DAYS   Final    Report Status 06/07/2011 FINAL   Final   HSV PCR     Status: Normal   Collection Time   06/04/11 10:30 AM      Component Value Range Status Comment   HSV, PCR Not Detected  Not Detected  Final    HSV 2 , PCR Not Detected  Not Detected  Final    Specimen Source-HSVPCR NO GROWTH   Final   CULTURE, BLOOD (ROUTINE X 2)     Status: Normal   Collection Time   06/04/11  3:15 PM      Component Value Range Status Comment   Specimen Description BLOOD RIGHT HAND   Final    Special Requests BOTTLES DRAWN AEROBIC AND ANAEROBIC 10CC   Final    Culture  Setup Time [id number]   Final    Culture NO GROWTH 5 DAYS   Final    Report Status 06/11/2011 FINAL   Final   CULTURE, BLOOD (ROUTINE X 2)     Status: Normal   Collection Time   06/04/11  3:27 PM      Component Value Range Status Comment   Specimen Description BLOOD LEFT HAND   Final    Special Requests BOTTLES DRAWN AEROBIC ONLY 3CC   Final    Culture  Setup Time [id number]   Final    Culture NO GROWTH 5 DAYS   Final    Report Status 06/11/2011 FINAL   Final   CULTURE, BLOOD (ROUTINE X 2)     Status: Normal   Collection Time   06/08/11  4:15  AM      Component Value Range Status Comment   Specimen Description BLOOD LEFT HAND   Final    Special Requests BOTTLES DRAWN AEROBIC ONLY 10CC   Final    Culture  Setup Time [id number]   Final    Culture NO GROWTH 5 DAYS   Final    Report Status 06/14/2011 FINAL   Final   CULTURE, BLOOD (ROUTINE X 2)     Status: Normal  Collection Time   06/08/11  4:20 AM      Component Value Range Status Comment   Specimen Description BLOOD LEFT HAND   Final    Special Requests BOTTLES DRAWN AEROBIC ONLY 10CC   Final    Culture  Setup Time [id number]   Final    Culture NO GROWTH 5 DAYS   Final    Report Status 06/14/2011 FINAL   Final   RESPIRATORY VIRUS PANEL (18 COMPONENTS)     Status: Abnormal   Collection Time   06/11/11  8:33 PM      Component Value Range Status Comment   Source - RVPAN BRONCHIAL ALVEOLAR LAVAGE   Final    Respiratory Syncytial Virus A NOT DETECTED   Final    Respiratory Syncytial Virus B NOT DETECTED   Final    Influenza A NOT DETECTED   Final    Influenza B NOT DETECTED   Final    Parainfluenza 1 NOT DETECTED   Final    Parainfluenza 2 NOT DETECTED   Final    Parainfluenza 3 DETECTED (*)  Final    Parainfluenza 4 NOT DETECTED   Final    Metapneumovirus NOT DETECTED   Final    Coxsackie and Echovirus NOT DETECTED   Final    Rhinovirus NOT DETECTED   Final    Adenovirus B NOT DETECTED   Final    Adenovirus E NOT DETECTED   Final    CoronavirusNL63 NOT DETECTED   Final    CoronavirusHKU1 NOT DETECTED   Final    Coronavirus229E NOT DETECTED   Final    CoronavirusOC43 NOT DETECTED   Final   CLOSTRIDIUM DIFFICILE BY PCR     Status: Normal   Collection Time   06/13/11  1:14 PM      Component Value Range Status Comment   C difficile by pcr NEGATIVE  NEGATIVE  Final   CLOSTRIDIUM DIFFICILE BY PCR     Status: Normal   Collection Time   06/19/11 10:25 AM      Component Value Range Status Comment   C difficile by pcr NEGATIVE  NEGATIVE  Final   CULTURE, BLOOD (ROUTINE X  2)     Status: Normal   Collection Time   06/19/11 10:57 AM      Component Value Range Status Comment   Specimen Description BLOOD LEFT ARM   Final    Special Requests BOTTLES DRAWN AEROBIC AND ANAEROBIC 10CC   Final    Culture  Setup Time [id number]   Final    Culture NO GROWTH 5 DAYS   Final    Report Status 06/25/2011 FINAL   Final   CULTURE, BLOOD (ROUTINE X 2)     Status: Normal   Collection Time   06/19/11 10:58 AM      Component Value Range Status Comment   Specimen Description BLOOD LEFT HAND   Final    Special Requests BOTTLES DRAWN AEROBIC AND ANAEROBIC 10CC   Final    Culture  Setup Time [id number]   Final    Culture NO GROWTH 5 DAYS   Final    Report Status 06/25/2011 FINAL   Final     Coagulation Studies: No results found for this basename: LABPROT:5,INR:5 in the last 72 hours  Urinalysis: No results found for this basename: COLORURINE:2,APPERANCEUR:2,LABSPEC:2,PHURINE:2,GLUCOSEU:2,HGBUR:2,BILIRUBINUR:2,KETONESUR:2,PROTEINUR:2,UROBILINOGEN:2,NITRITE:2,LEUKOCYTESUR:2 in the last 72 hours    Imaging: Dg Swallowing Func-no Report  06/24/2011  CLINICAL DATA: dysphagia   FLUOROSCOPY FOR SWALLOWING FUNCTION STUDY:  Fluoroscopy was provided for swallowing function study, which was  administered by a speech pathologist.  Final results and recommendations  from this study are contained within the speech pathology report.       Medications:      . sodium chloride 10 mL/hr at 06/23/11 0043      . amiodarone  200 mg Oral Daily  . antiseptic oral rinse  15 mL Mouth Rinse QID  . chlorhexidine  15 mL Mouth Rinse BID  . collagenase   Topical Daily  . darbepoetin (ARANESP) injection - DIALYSIS  100 mcg Intravenous Q Wed-HD  . ferric gluconate (FERRLECIT/NULECIT) IV  125 mg Intravenous Daily  . HYDROcodone-acetaminophen  1 tablet Oral Once  . pantoprazole  40 mg Oral BID  . predniSONE  10 mg Per Tube Q breakfast  . sirolimus  0.75 mg Oral Daily  . DISCONTD: potassium  chloride  40 mEq Oral Daily   albuterol, fentaNYL, HYDROcodone-acetaminophen, metoprolol, ondansetron (ZOFRAN) IV  Assessment/ Plan:  is a 56 y.o. yo male s/p renal transplant who was admitted on 06/03/2011 with PNA pulm HTN, volume overload now with acute on chronic was dialysis  1. PNA/parainfluenza/cardiomyopathy/hypotension- completed RX for paraflu. Has trach and requiring vent support. Slow imp in CXR BP stable but soft no pressors 2. AKI, oliguric, HD dependent with CRRT 5/13-No pulm edema.  Do intermittent HD as needed. Will need dialysis today 3. 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 He is oliguric. Blood sirolimus level on 5/8 at 5 pm was 2.0  4. Anemia- hgb stable. Aranesp. Iron. 5. Secondary hyperparathyroidism- no treatment right now, rocaltrol stopped - not essential at this time and calcium up 6. PNA parainfluenza- finished course  Will plan dialysis today for clearance. Urine output great. Will watch for recovery and dialyze PRN   LOS: 23 Joellen Tullos W @TODAY @7 :59 AM

## 2011-06-26 NOTE — Progress Notes (Signed)
ASSName: Chad [name] MRN: [id number] DOB: 11/13/1955    LOS: 23  PCCM  NOTE  History of Present Illness:  56 yo male admitted 06/03/2011 with cough, dyspnea and fever.  Progressed to [facility] 05/08 and PCCM consulted.  Has hx of renal transplant. PMHx Hep C, HTN, Gout  Cultures: BAL bronch 5/8>>>neg    AFB >> neg   Viral panel >> POS parainfluenza virus   Pneumocystis >> neg   Fungus >> neg   HSV >> neg BC 5/7>>>neg  Blood X 2 5/12 >> neg  Antibiotics: (per ID) 5/7 levoflox>>>5/10 5/7 bactrim >>>5/10 5/7 micafungin >> 5/14 5/7 vanc >> 5/10 5/10 cefepime >> 5/15 5/15 Ribaviran/IVIG >>Completed  Tests / Events: 5/8 hypoxic resp failure, intubated, near arrest 5/11 worsening shock , on sepsis protocol + dobut for low co-ox 5/15 Family mtg: continue full aggressive support but NCB if arrests. To reassess in 4-5 days. If no progress, consider withdrawal at that time 5/15 Trial of Ribaviran/IVIG 5/16 Markedly improved - reduced pressors, improved cognition, gas exchange improved 5/17 continued improvement. Tolerates PS 5 cm H2O with Vt > 800cc 5/17- extubated, re intubated 5/20- trach 5/24 Tunneled HD cath placed 5/26 CRRT stopped 5/27 Speech valve fitted. 5/8 diet - regular, thin 5/28 Dental consult called 5/29 downsized to 6 cuffless  Subjective: Tolerating trach collar.  Speaking with speech valve.  C/o tooth pain left upper molar.  Vital Signs: Temp:  [97.6 F (36.4 C)-98.9 F (37.2 C)] 98.9 F (37.2 C) (05/30 1259) Pulse Rate:  [82-118] 96  (05/30 1259) Resp:  [15-28] 22  (05/30 1259) BP: (102-136)/(40-95) 128/62 mmHg (05/30 1259) SpO2:  [92 %-100 %] 93 % (05/30 1259) FiO2 (%):  [23 %-28 %] 28 % (05/30 1259) Weight:  [243 lb 6.2 oz (110.4 kg)-249 lb 1.9 oz (113 kg)] 244 lb 11.4 oz (111 kg) (05/30 1259) I/O last 3 completed shifts: In: 1785 [P.O.:1200; I.V.:200; Other:275; IV Piggyback:110] Out: 2200 [Urine:2200]   Physical Examination: General - no  distress HEENT - trach site clean. Left upper molar blackened root Cardiac - s1s2 regular, no murmur Chest - scattered rhonchi Abd - soft, non tender, +bowel sounds Ext - 2+edema Neuro - alert, follows commands, moves all extremities   Ventilator settings: Vent Mode:  [-]  FiO2 (%):  [23 %-28 %] 28 %   Lab 06/26/11 0420 06/25/11 0818 06/24/11 0430  NA 129* 130* 137  K 5.6* 5.7* 5.0  CL 95* 93* 100  CO2 19 23 23   BUN 97* 94* 85*  CREATININE 4.83* 4.69* 4.11*  GLUCOSE 106* 83 112*    Lab 06/26/11 0935 06/25/11 0818 06/24/11 0430  HGB 8.2* 8.9* 8.5*  HCT 26.4* 29.1* 28.7*  WBC 8.8 14.0* 17.9*  PLT 91* 73* 72*        Assessment and Plan:  PULMONARY  Bronchoscopy 05/08>>diffuse, thick secretions ETT 5/8 >>5/17>>>retubed 6 hrs>>>5/20 Trach 5/20 (df)>>>  Acute respiratory failure 2nd to pneumonia, ARDS.  Required trach 05/20. Plan: -f/u CXR intermittently, last 5/26 - lt basilar ASD -trach collar as tolerated>> -speech valve fitted - downsized trach - decannulate at some point once ambulating   CARDIAC Lt IJ CVL 05/08>>5/28 Rt Radial aline 05/08>>out R IJ HD cath 5/12 >> 5/13 (inadvertent removal) R IJ HD cath 5/13 >>5/24 Tunneled HD cath 5/24>>   5/08 Doppler legs>>negative for DVT 5/12 Echo>>EF 15 to 20%, mild LVH, diffuse hypokinesis, mild MR, mod/severe LA dilation, mod RV systolic dysfx, PAS 52 mmHg 5/23 Doppler legs>>negative for  DVT  Shock>>likely septic and cardiogenic.  Resolved.  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 Plan: -continue po amiodarone per cardiology   RENAL  Acute on chronic renal failure with hx of renal transplant.  CVVH started 5/12>>stopped 5/26. Plan: -HD per renal -continue sirolimus, prednisone -dc potassium supplement   ID  Blood 5/23>> C diff 5/23>>negative  Vancomycin 5/23>> 5/28  Parainfluenza pneumonia.  Given IVIG 5/15.   Ribavarin started 5/16, and completed 7 day course. Plan: -monitor clinically off therapy  Cellulitis. -off abx per ID  Fever.  No recurrence since 5/22.   -If persists may need CT abd/pelvis, and broaden Abx  Tooth pain. -extraction planned   HEMATOLOGY  Basename 06/26/11 0935 06/25/11 0818  HGB 8.2* 8.9*   Anemia of critical illness, chronic disease, and renal failure. Plan: -f/u CBC -transfuse for Hb < 7 -continue nulecit, aranesp per renal  Leukocytosis>>improving.  Thrombocytopenia. Plan: -f/u CBC   ENDOCRINE  Steroid induced hyperglycemia. CBG (last 3)  No results found for this basename: GLUCAP:3 in the last 72 hours   Plan: -improved, monitor blood sugar on BMET   GASTROENTEROLOGY  Nurtition. Plan: -swallow eval - regular , thin liquids   Hx of Hepatitis C. Plan: -f/u LFT intermittently  DISPO - accepted to rehab, hopefully 5/31 after dental procedure  BEST PRACTICE -BID protonix for SUP -SCD for DVT prophylaxis -Full code -PT/OT  -keep in SDU, PCCM service  [facility] V.   [phone] 06/26/2011, 1:34 PM

## 2011-06-26 NOTE — Consult Note (Signed)
DENTAL CONSULTATION  Date of Consultation:  06/26/2011 Patient Name:   Chad [name] Date of Birth:   Jan 21, 1956 Medical Record Number: [id number]  VITALS: BP 120/79  Pulse 88  Temp(Src) 98.3 F (36.8 C) (Oral)  Resp 23  Ht 5\' 11"  (1.803 m)  Wt 249 lb 1.9 oz (113 kg)  BMI 34.75 kg/m2  SpO2 100%   CHIEF COMPLAINT: Dental consultation requested to evaluate toothache.  HPI: Chad [name] is a 56 year old male referred for evaluation of a toothache. Patient with a history of fracturing an upper left molar in early May of 2013. The patient subsequently developed ventilator dependent respiratory failure and now has a tracheostomy in place. Patient suffered significant deconditioning and currently is planned for extensive rehabilitation with an inpatient rehabilitation admission.  Patient developed toothache symptoms approximately one week ago during this admission. Patient describes the pain as being sharp and constant in nature reaching 10/10 in intensity. Patient indicates that the pain is currently 8/10 in intensity.  Patient last saw his primary dentist in October of 2012 for an exam and cleaning. Patient also has been recently seen by an endodontist for evaluation of a cracked tooth. Patient did have root canal therapy at that time and a crown is planned. Patient does seek regular dental care on an every 6 month basis by report. Patient sees Drs. Lequita Halt and [place] for his dental care.  Patient Active Problem List  Diagnoses  . HCAP (healthcare-associated pneumonia)  . Thrombocytopenia  . Acute respiratory failure  . ARDS (adult respiratory distress syndrome)  . Renal transplant disorder  . Acute systolic heart failure  . Septic shock  . Acute renal failure  . Cardiomyopathy   PMH: Past Medical History  Diagnosis Date  . Hypertension   . H/O kidney transplant     PSH: Past Surgical History  Procedure Date  . Nephrectomy transplanted organ   . Insertion of dialysis  catheter 06/20/2011    Procedure: INSERTION OF DIALYSIS CATHETER;  Surgeon: [name], MD;  Location: [facility] OR;  Service: Vascular;  Laterality: Right;  Ultrasound guided insertion of right internal jugular dialysis catheter    ALLERGIES: No Known Allergies  MEDICATIONS: Current Facility-Administered Medications  Medication Dose Route Frequency Provider Last Rate Last Dose  . 0.9 %  sodium chloride infusion   Intravenous Continuous [name], MD 10 mL/hr at 06/23/11 [phone]    . albuterol (PROVENTIL) (5 MG/ML) 0.5% nebulizer solution 2.5 mg  2.5 mg Nebulization Q3H PRN Kalman Shan, MD   2.5 mg at 06/06/11 0803  . amiodarone (PACERONE) tablet 200 mg  200 mg Oral Daily Oretha Milch, MD   200 mg at 06/25/11 0952  . antiseptic oral rinse (BIOTENE) solution 15 mL  15 mL Mouth Rinse QID [name], MD   15 mL at 06/26/11 0547  . chlorhexidine (PERIDEX) 0.12 % solution 15 mL  15 mL Mouth Rinse BID [name], MD   15 mL at 06/25/11 2027  . collagenase (SANTYL) ointment   Topical Daily [name], MD   1 application at 06/25/11 1148  . darbepoetin (ARANESP) injection 100 mcg  100 mcg Intravenous Q Wed-HD Cecille Aver, MD   100 mcg at 06/18/11 1514  . fentaNYL (SUBLIMAZE) injection 25 mcg  25 mcg Intravenous Q1H PRN [name], MD   25 mcg at 06/26/11 [phone]  . ferric gluconate (NULECIT) 125 mg in sodium chloride 0.9 % 100 mL IVPB  125 mg  Intravenous Daily Sadie Haber, MD   125 mg at 06/25/11 1225  . HYDROcodone-acetaminophen (NORCO) 5-325 MG per tablet 1 tablet  1 tablet Oral Q8H PRN Oretha Milch, MD   1 tablet at 06/26/11 0547  . HYDROcodone-acetaminophen (NORCO) 5-325 MG per tablet 1 tablet  1 tablet Oral Once Leslye Peer, MD   1 tablet at 06/25/11 2230  . metoprolol (LOPRESSOR) injection 2.5-5 mg  2.5-5 mg Intravenous Q3H PRN Merwyn Katos, MD   2.5 mg at 06/16/11 0028  . ondansetron (ZOFRAN) injection 4 mg  4 mg Intravenous Q6H PRN  Coralyn Helling, MD   4 mg at 06/22/11 1241  . pantoprazole (PROTONIX) EC tablet 40 mg  40 mg Oral BID Alyson Reedy, MD   40 mg at 06/25/11 2026  . predniSONE 5 MG/5ML solution 10 mg  10 mg Per Tube Q breakfast Coralyn Helling, MD   10 mg at 06/25/11 0848  . sirolimus (RAPAMUNE) 1 MG/ML solution 0.75 mg  0.75 mg Oral Daily [name], MD   0.75 mg at 06/25/11 1137  . DISCONTD: potassium chloride 20 MEQ/15ML (10%) liquid 40 mEq  40 mEq Oral Daily Cecille Aver, MD   40 mEq at 06/25/11 4782    LABS: Lab Results  Component Value Date   WBC 14.0* 06/25/2011   HGB 8.9* 06/25/2011   HCT 29.1* 06/25/2011   MCV 107.0* 06/25/2011   PLT 73* 06/25/2011      Component Value Date/Time   NA 129* 06/26/2011 0420   K 5.6* 06/26/2011 0420   CL 95* 06/26/2011 0420   CO2 19 06/26/2011 0420   GLUCOSE 106* 06/26/2011 0420   BUN 97* 06/26/2011 0420   CREATININE 4.83* 06/26/2011 0420   CALCIUM 8.7 06/26/2011 0420   GFRNONAA 12* 06/26/2011 0420   GFRAA 14* 06/26/2011 0420   Lab Results  Component Value Date   INR 1.16 06/15/2011   INR 1.27 06/08/2011   INR 1.23 04/07/2009   No results found for this basename: PTT    SOCIAL HISTORY: History   Social History  . Marital Status: Single    Spouse Name: N/A    Number of Children: N/A  . Years of Education: N/A   Occupational History  . Not on file.   Social History Main Topics  . Smoking status: Never Smoker   . Smokeless tobacco: Not on file  . Alcohol Use: No  . Drug Use:   . Sexually Active:    Other Topics Concern  . Not on file   Social History Narrative  . No narrative on file    FAMILY HISTORY: History reviewed. No pertinent family history.   REVIEW OF SYSTEMS: Reviewed from chart for this admission.  DENTAL HISTORY: CHIEF COMPLAINT: Dental consultation requested to evaluate toothache.  HPI: Chad [name] is a 56 year old male referred for evaluation of a toothache. Patient with a history of fracturing an upper left  molar in early May of 2013. The patient subsequently developed ventilator dependent respiratory failure and now has a tracheostomy in place. Patient suffered significant deconditioning and currently is planned for extensive rehabilitation with an inpatient rehabilitation admission.  Patient developed toothache symptoms approximately one week ago during this admission. Patient describes the pain as being sharp and constant in nature reaching 10/10 in intensity. Patient indicates that the pain is currently 8/10 in intensity.  Patient last saw his primary dentist in October of 2012 for an exam and cleaning. Patient also has  been recently seen by an endodontist for evaluation of a cracked tooth. Patient did have root canal therapy at that time and a crown is planned. Patient does seek regular dental care on an every 6 month basis by report. Patient sees Drs. Lequita Halt and [place] for his dental care.  DENTAL EXAMINATION:  GENERAL: Patient is a well-developed, well-nourished male lying in a bed with tracheostomy in place and no acute distress. HEAD AND NECK: I am unable to palpate any lymphadenopathy. Patient denies acute TMJ symptoms. INTRAORAL EXAM: Patient has normal saliva. I do not see any abscess formation. DENTITION: Patient with multiple missing teeth. Upper left molar has  fractured buccal cusps with exposure of root canal system. I would need dental radiographs to determine the exact number of present / missing teeth. PERIODONTAL: Patient with chronic periodontitis but with relatively good oral hygiene. No obvious tooth mobility is noted. DENTAL CARIES/SUBOPTIMAL RESTORATIONS: Upper left molar has fractured cusps and defective restoration. No other obvious gross caries are noted. ENDODONTIC: Patient with acute, irreversible pulpitis symptoms coming from upper left molar. The patient has had previous root canal therapy by report. CROWN AND BRIDGE: Patient has multiple crown restorations that appear to  be acceptable. PROSTHODONTIC: The patient denies presence of partial dentures at this time. OCCLUSION: Patient with a poor occlusal scheme secondary to multiple missing teeth, supra-eruption and drifting of the unopposed teeth into the edentulous areas and lack of replacement of all missing teeth with dental prostheses.  RADIOGRAPHIC INTERPRETATION: An orthopantogram was ordered but unable to be taken due to the inability the patient to stand for the dental x-ray.   ASSESSMENTS: 1. Acute, irreversible pulpitis symptoms of upper left molar 2. Fractured upper left molar with exposure of the pulp. 3. Chronic periodontitis with minimal plaque accumulations 4. Missing teeth 5. Poor occlusal scheme but a stable occlusion. 6. Thrombocytopenia with potential risk for bleeding with invasive dental procedures.    PLAN/RECOMMENDATIONS: 1. I discussed the risks, benefits, and complications of various treatment options with the patient in relationship to his medical and dental conditions. We discussed various treatment options to include no treatment, extraction of upper left molar with alveoloplasty, pre-prosthetic surgery as indicated, root canal therapy, crown and bridge therapy, implant therapy, and replacement of missing teeth as indicated. The patient currently wishes to proceed with extraction of the upper left molar with alveoloplasty as indicated in the operating room. Patient does NOT wish to have endodontic evaluation as an outpatient with possible root canal therapy. Patient has been scheduled for the operating room procedure to remove the tooth on Friday, 06/27/2011 at 11 AM.   2. Discussion of findings with medical team and coordination of future medical and dental care.    [name], DDS

## 2011-06-26 NOTE — Progress Notes (Signed)
thx for taking care of this.

## 2011-06-26 NOTE — Progress Notes (Signed)
I await dental consult to assist in planning of the timing for inpt rehab admit. Insurance approval pending. [phone]

## 2011-06-26 NOTE — Progress Notes (Signed)
Nutrition Follow-up  MBSS on 5/28 recommending Regular diet with thin liquids. Evaluated for CIR. Tube feeding discontinued with initiation of diet on 5/28. Pt currently consumed at least 75% of lunch.  HD 5/30  Diet Order: Renal 80 - 90  Meds: Scheduled Meds:   . acetaminophen  650 mg Oral Once  . amiodarone  200 mg Oral Daily  . antiseptic oral rinse  15 mL Mouth Rinse QID  .  ceFAZolin (ANCEF) IV  2 g Intravenous 60 min Pre-Op  . chlorhexidine  15 mL Mouth Rinse BID  . collagenase   Topical Daily  . darbepoetin      . darbepoetin (ARANESP) injection - DIALYSIS  100 mcg Intravenous Q Thu-HD  . ferric gluconate (FERRLECIT/NULECIT) IV  125 mg Intravenous Daily  . HYDROcodone-acetaminophen  1 tablet Oral Once  . pantoprazole  40 mg Oral BID  . predniSONE  10 mg Per Tube Q breakfast  . sirolimus  0.75 mg Oral Daily  . DISCONTD: darbepoetin (ARANESP) injection - DIALYSIS  100 mcg Intravenous Q Wed-HD   Continuous Infusions:   . sodium chloride 10 mL/hr at 06/23/11 0043   PRN Meds:.albuterol, fentaNYL, HYDROcodone-acetaminophen, metoprolol, ondansetron (ZOFRAN) IV  Labs:  CMP     Component Value Date/Time   NA 129* 06/26/2011 0420   K 5.6* 06/26/2011 0420   CL 95* 06/26/2011 0420   CO2 19 06/26/2011 0420   GLUCOSE 106* 06/26/2011 0420   BUN 97* 06/26/2011 0420   CREATININE 4.83* 06/26/2011 0420   CALCIUM 8.7 06/26/2011 0420   PROT 8.9* 06/22/2011 0411   ALBUMIN 1.9* 06/26/2011 0420   AST 154* 06/22/2011 0411   ALT 61* 06/22/2011 0411   ALKPHOS 259* 06/22/2011 0411   BILITOT 2.2* 06/22/2011 0411   GFRNONAA 12* 06/26/2011 0420   GFRAA 14* 06/26/2011 0420   Phosphorus  Date/Time Value Range Status  06/26/2011  4:20 AM 7.7* 2.3-4.6 (mg/dL) Final   Magnesium  Date/Time Value Range Status  06/22/2011  4:11 AM 2.5  1.5-2.5 (mg/dL) Final    Intake/Output Summary (Last 24 hours) at 06/26/11 1346 Last data filed at 06/26/11 1259  Gross per 24 hour  Intake    695 ml  Output   1252 ml    Net   -557 ml    Weight Status:  111 kg s/p HD on 5/30 - overall weights decreasing from admit weight of 132 kg  Body mass index is 34.13 kg/(m^2). Pt is Obese, Class I.  Estimated needs:  2075 kcals, 99 - 108 grams protein; noted; if pt to become HD-dependent will need 115 - 130 grams protein daily  Nutrition Dx: Inadequate oral intake now r/t variable appetite AEB variable meal completion.  New Goal:  PO intake to meet at least 90% of estimated needs. Ongoing.  Intervention:  Add Nepro Shake daily PRN to help meet calorie needs if intake declines. RD to address education needs closer to discharge.  Adair Laundry Pager #:  [phone]

## 2011-06-27 ENCOUNTER — Inpatient Hospital Stay (HOSPITAL_COMMUNITY)
Admission: RE | Admit: 2011-06-27 | Discharge: 2011-07-16 | DRG: 462 | Disposition: A | Discharge status: Home Health | Payer: BC Managed Care – PPO | Source: Ambulatory Visit | Attending: Physical Medicine & Rehabilitation | Admitting: Physical Medicine & Rehabilitation

## 2011-06-27 ENCOUNTER — Encounter (HOSPITAL_COMMUNITY): Payer: Self-pay | Admitting: Certified Registered"

## 2011-06-27 ENCOUNTER — Encounter (HOSPITAL_COMMUNITY)
Admission: EM | Disposition: A | Discharge status: Rehabilitation Facility | Payer: Self-pay | Source: Ambulatory Visit | Attending: Internal Medicine

## 2011-06-27 ENCOUNTER — Inpatient Hospital Stay (HOSPITAL_COMMUNITY): Payer: BC Managed Care – PPO | Admitting: Certified Registered"

## 2011-06-27 ENCOUNTER — Ambulatory Visit (HOSPITAL_COMMUNITY): Admission: RE | Admit: 2011-06-27 | Payer: BC Managed Care – PPO | Source: Ambulatory Visit | Admitting: Dentistry

## 2011-06-27 DIAGNOSIS — N184 Chronic kidney disease, stage 4 (severe): Secondary | ICD-10-CM

## 2011-06-27 DIAGNOSIS — N2581 Secondary hyperparathyroidism of renal origin: Secondary | ICD-10-CM

## 2011-06-27 DIAGNOSIS — N038 Chronic nephritic syndrome with other morphologic changes: Secondary | ICD-10-CM

## 2011-06-27 DIAGNOSIS — L97809 Non-pressure chronic ulcer of other part of unspecified lower leg with unspecified severity: Secondary | ICD-10-CM

## 2011-06-27 DIAGNOSIS — D638 Anemia in other chronic diseases classified elsewhere: Secondary | ICD-10-CM

## 2011-06-27 DIAGNOSIS — I428 Other cardiomyopathies: Secondary | ICD-10-CM

## 2011-06-27 DIAGNOSIS — I129 Hypertensive chronic kidney disease with stage 1 through stage 4 chronic kidney disease, or unspecified chronic kidney disease: Secondary | ICD-10-CM

## 2011-06-27 DIAGNOSIS — Z940 Kidney transplant status: Secondary | ICD-10-CM

## 2011-06-27 DIAGNOSIS — R29898 Other symptoms and signs involving the musculoskeletal system: Secondary | ICD-10-CM

## 2011-06-27 DIAGNOSIS — B1920 Unspecified viral hepatitis C without hepatic coma: Secondary | ICD-10-CM

## 2011-06-27 DIAGNOSIS — I4891 Unspecified atrial fibrillation: Secondary | ICD-10-CM

## 2011-06-27 DIAGNOSIS — E876 Hypokalemia: Secondary | ICD-10-CM

## 2011-06-27 DIAGNOSIS — N179 Acute kidney failure, unspecified: Secondary | ICD-10-CM | POA: Diagnosis present

## 2011-06-27 DIAGNOSIS — G7281 Critical illness myopathy: Secondary | ICD-10-CM

## 2011-06-27 DIAGNOSIS — Z5189 Encounter for other specified aftercare: Principal | ICD-10-CM

## 2011-06-27 DIAGNOSIS — R5381 Other malaise: Secondary | ICD-10-CM

## 2011-06-27 DIAGNOSIS — J189 Pneumonia, unspecified organism: Secondary | ICD-10-CM

## 2011-06-27 DIAGNOSIS — Z930 Tracheostomy status: Secondary | ICD-10-CM

## 2011-06-27 DIAGNOSIS — J80 Acute respiratory distress syndrome: Secondary | ICD-10-CM | POA: Diagnosis not present

## 2011-06-27 DIAGNOSIS — T8610 Unspecified complication of kidney transplant: Secondary | ICD-10-CM | POA: Diagnosis present

## 2011-06-27 DIAGNOSIS — K0401 Reversible pulpitis: Secondary | ICD-10-CM

## 2011-06-27 HISTORY — PX: MULTIPLE EXTRACTIONS WITH ALVEOLOPLASTY: SHX5342

## 2011-06-27 LAB — CBC
MCH: 32.4 pg (ref 26.0–34.0)
MCHC: 30.1 g/dL (ref 30.0–36.0)
Platelets: 97 10*3/uL — ABNORMAL LOW (ref 150–400)
RDW: 21.3 % — ABNORMAL HIGH (ref 11.5–15.5)

## 2011-06-27 LAB — RENAL FUNCTION PANEL
Albumin: 1.9 g/dL — ABNORMAL LOW (ref 3.5–5.2)
CO2: 26 mEq/L (ref 19–32)
Calcium: 8.2 mg/dL — ABNORMAL LOW (ref 8.4–10.5)
Creatinine, Ser: 2.86 mg/dL — ABNORMAL HIGH (ref 0.50–1.35)
GFR calc Af Amer: 27 mL/min — ABNORMAL LOW (ref >90)
GFR calc non Af Amer: 23 mL/min — ABNORMAL LOW (ref >90)
Phosphorus: 4.9 mg/dL — ABNORMAL HIGH (ref 2.3–4.6)
Sodium: 131 mEq/L — ABNORMAL LOW (ref 135–145)

## 2011-06-27 LAB — SURGICAL PCR SCREEN: MRSA, PCR: NEGATIVE

## 2011-06-27 SURGERY — MULTIPLE EXTRACTION WITH ALVEOLOPLASTY
Anesthesia: Monitor Anesthesia Care | Site: Mouth | Wound class: Clean Contaminated

## 2011-06-27 MED ORDER — DIPHENHYDRAMINE HCL 12.5 MG/5ML PO ELIX: 12.5000 mg | ORAL_SOLUTION | Freq: Four times a day (QID) | ORAL | Status: DC | PRN

## 2011-06-27 MED ORDER — LACTATED RINGERS IV SOLN
INTRAVENOUS | Status: DC | PRN
  Administered 2011-06-27: 12:00:00 via INTRAVENOUS

## 2011-06-27 MED ORDER — HEMOSTATIC AGENTS (NO CHARGE) OPTIME
TOPICAL | Status: DC | PRN
  Administered 2011-06-27: 1 via TOPICAL

## 2011-06-27 MED ORDER — SIROLIMUS 1 MG/ML PO SOLN
0.7500 mg | Freq: Every day | ORAL | Status: DC
  Administered 2011-06-28 – 2011-07-03 (×6): 0.75 mg via ORAL
  Filled 2011-06-27 (×4): qty 0.75

## 2011-06-27 MED ORDER — AMIODARONE HCL 200 MG PO TABS
200.0000 mg | ORAL_TABLET | Freq: Every day | ORAL | Status: DC
  Administered 2011-06-28 – 2011-07-15 (×18): 200 mg via ORAL
  Filled 2011-06-27 (×23): qty 1

## 2011-06-27 MED ORDER — FLEET ENEMA 7-19 GM/118ML RE ENEM
1.0000 | ENEMA | Freq: Once | RECTAL | Status: DC | PRN
  Filled 2011-06-27: qty 1

## 2011-06-27 MED ORDER — TRAZODONE HCL 50 MG PO TABS: 25.0000 mg | ORAL_TABLET | Freq: Every evening | ORAL | Status: DC | PRN

## 2011-06-27 MED ORDER — PANTOPRAZOLE SODIUM 40 MG PO TBEC: 40.0000 mg | DELAYED_RELEASE_TABLET | Freq: Two times a day (BID) | ORAL | Status: DC

## 2011-06-27 MED ORDER — AMIODARONE HCL 200 MG PO TABS: 200.0000 mg | ORAL_TABLET | Freq: Every day | ORAL | Status: DC

## 2011-06-27 MED ORDER — SIROLIMUS 1 MG PO TABS: 0.7500 mg | ORAL_TABLET | Freq: Every day | ORAL | Status: DC

## 2011-06-27 MED ORDER — DARBEPOETIN ALFA-POLYSORBATE 100 MCG/0.5ML IJ SOLN: 100.0000 ug | INTRAMUSCULAR | Status: DC

## 2011-06-27 MED ORDER — ONDANSETRON HCL 4 MG PO TABS: 4.0000 mg | ORAL_TABLET | Freq: Four times a day (QID) | ORAL | Status: DC | PRN

## 2011-06-27 MED ORDER — FLEET ENEMA 7-19 GM/118ML RE ENEM
1.0000 | ENEMA | Freq: Once | RECTAL | Status: AC | PRN
  Filled 2011-06-27: qty 1

## 2011-06-27 MED ORDER — SODIUM CHLORIDE 0.9 % IV SOLN: 125.0000 mg | Freq: Every day | INTRAVENOUS | Status: DC

## 2011-06-27 MED ORDER — POLYETHYLENE GLYCOL 3350 17 G PO PACK
17.0000 g | PACK | Freq: Every day | ORAL | Status: DC | PRN
  Filled 2011-06-27: qty 1

## 2011-06-27 MED ORDER — ACETAMINOPHEN 325 MG PO TABS: 325.0000 mg | ORAL_TABLET | ORAL | Status: DC | PRN

## 2011-06-27 MED ORDER — GUAIFENESIN-DM 100-10 MG/5ML PO SYRP: 5.0000 mL | ORAL_SOLUTION | Freq: Four times a day (QID) | ORAL | Status: DC | PRN

## 2011-06-27 MED ORDER — PREDNISOLONE 5 MG PO TABS
10.0000 mg | ORAL_TABLET | Freq: Every day | ORAL | Status: DC
  Administered 2011-06-28: 10 mg via ORAL
  Filled 2011-06-27 (×2): qty 2

## 2011-06-27 MED ORDER — DARBEPOETIN ALFA-POLYSORBATE 100 MCG/0.5ML IJ SOLN
100.0000 ug | INTRAMUSCULAR | Status: DC
  Filled 2011-06-27 (×2): qty 0.5

## 2011-06-27 MED ORDER — PROPOFOL 10 MG/ML IV EMUL
INTRAVENOUS | Status: DC | PRN
  Administered 2011-06-27: 75 ug/kg/min via INTRAVENOUS

## 2011-06-27 MED ORDER — HYDROMORPHONE HCL PF 1 MG/ML IJ SOLN
0.2500 mg | INTRAMUSCULAR | Status: DC | PRN
  Administered 2011-06-27: 0.5 mg via INTRAVENOUS

## 2011-06-27 MED ORDER — CHLORHEXIDINE GLUCONATE 0.12 % MT SOLN
15.0000 mL | Freq: Two times a day (BID) | OROMUCOSAL | Status: DC
  Administered 2011-06-27 – 2011-07-15 (×33): 15 mL via OROMUCOSAL
  Filled 2011-06-27 (×42): qty 15

## 2011-06-27 MED ORDER — COLLAGENASE 250 UNIT/GM EX OINT: TOPICAL_OINTMENT | Freq: Every day | CUTANEOUS | Status: DC

## 2011-06-27 MED ORDER — PREDNISONE 5 MG/5ML PO SOLN: 10.0000 mg | Freq: Every day | ORAL | Status: DC

## 2011-06-27 MED ORDER — SODIUM CHLORIDE 0.9 % IV SOLN
125.0000 mg | INTRAVENOUS | Status: AC
  Administered 2011-06-28: 125 mg via INTRAVENOUS
  Filled 2011-06-27 (×2): qty 10

## 2011-06-27 MED ORDER — KETAMINE HCL 100 MG/ML IJ SOLN
INTRAMUSCULAR | Status: AC
  Filled 2011-06-27: qty 1

## 2011-06-27 MED ORDER — ALBUTEROL SULFATE (5 MG/ML) 0.5% IN NEBU: 2.5000 mg | INHALATION_SOLUTION | RESPIRATORY_TRACT | Status: DC | PRN

## 2011-06-27 MED ORDER — ONDANSETRON HCL 4 MG/2ML IJ SOLN: 4.0000 mg | Freq: Four times a day (QID) | INTRAMUSCULAR | Status: DC | PRN

## 2011-06-27 MED ORDER — NEPRO/CARBSTEADY PO LIQD: 237.0000 mL | Freq: Every day | ORAL | Status: DC | PRN

## 2011-06-27 MED ORDER — ALUMINUM HYDROXIDE GEL 320 MG/5ML PO SUSP
30.0000 mL | Freq: Four times a day (QID) | ORAL | Status: DC | PRN
  Filled 2011-06-27: qty 30

## 2011-06-27 MED ORDER — BISACODYL 10 MG RE SUPP
10.0000 mg | Freq: Every day | RECTAL | Status: DC | PRN
  Filled 2011-06-27: qty 1

## 2011-06-27 MED ORDER — LACTATED RINGERS IV SOLN
INTRAVENOUS | Status: DC
  Administered 2011-06-27: 11:00:00 via INTRAVENOUS

## 2011-06-27 MED ORDER — HYDROCODONE-ACETAMINOPHEN 5-325 MG PO TABS
1.0000 | ORAL_TABLET | ORAL | Status: DC | PRN
  Filled 2011-06-27: qty 2

## 2011-06-27 MED ORDER — BIOTENE DRY MOUTH MT LIQD
15.0000 mL | Freq: Four times a day (QID) | OROMUCOSAL | Status: DC
  Administered 2011-06-28 – 2011-07-12 (×17): 15 mL via OROMUCOSAL

## 2011-06-27 MED ORDER — CEFAZOLIN SODIUM 1-5 GM-% IV SOLN
INTRAVENOUS | Status: DC | PRN
  Administered 2011-06-27: 2 g via INTRAVENOUS

## 2011-06-27 MED ORDER — BISACODYL 10 MG RE SUPP: 10.0000 mg | Freq: Every day | RECTAL | Status: DC | PRN

## 2011-06-27 MED ORDER — ALUMINUM HYDROXIDE GEL 320 MG/5ML PO SUSP: 30.0000 mL | Freq: Four times a day (QID) | ORAL | Status: DC | PRN

## 2011-06-27 MED ORDER — KETAMINE HCL 50 MG/ML IJ SOLN
INTRAMUSCULAR | Status: DC | PRN
  Administered 2011-06-27: 25 mg via INTRAMUSCULAR

## 2011-06-27 MED ORDER — COLLAGENASE 250 UNIT/GM EX OINT
TOPICAL_OINTMENT | Freq: Every day | CUTANEOUS | Status: DC
  Administered 2011-06-28 – 2011-07-15 (×18): via TOPICAL
  Filled 2011-06-27 (×2): qty 30

## 2011-06-27 MED ORDER — LIDOCAINE-EPINEPHRINE 2 %-1:100000 IJ SOLN
INTRAMUSCULAR | Status: DC | PRN
  Administered 2011-06-27 (×2): 1.7 mL

## 2011-06-27 MED ORDER — METHOCARBAMOL 500 MG PO TABS: 500.0000 mg | ORAL_TABLET | Freq: Four times a day (QID) | ORAL | Status: DC | PRN

## 2011-06-27 MED ORDER — DROPERIDOL 2.5 MG/ML IJ SOLN: 0.6250 mg | INTRAMUSCULAR | Status: DC | PRN

## 2011-06-27 MED ORDER — PANTOPRAZOLE SODIUM 40 MG PO TBEC
40.0000 mg | DELAYED_RELEASE_TABLET | Freq: Two times a day (BID) | ORAL | Status: DC
  Administered 2011-06-28 – 2011-07-16 (×37): 40 mg via ORAL
  Filled 2011-06-27 (×39): qty 1

## 2011-06-27 SURGICAL SUPPLY — 36 items
ALCOHOL 70% 16 OZ (MISCELLANEOUS) ×2 IMPLANT
ATTRACTOMAT 16X20 MAGNETIC DRP (DRAPES) ×2 IMPLANT
BLADE SURG 15 STRL LF DISP TIS (BLADE) ×2 IMPLANT
BLADE SURG 15 STRL SS (BLADE) ×2
CLOTH BEACON ORANGE TIMEOUT ST (SAFETY) ×2 IMPLANT
COVER SURGICAL LIGHT HANDLE (MISCELLANEOUS) ×2 IMPLANT
CRADLE DONUT ADULT HEAD (MISCELLANEOUS) ×2 IMPLANT
GAUZE PACKING FOLDED 2  STR (GAUZE/BANDAGES/DRESSINGS)
GAUZE PACKING FOLDED 2 STR (GAUZE/BANDAGES/DRESSINGS) ×1 IMPLANT
GAUZE SPONGE 4X4 16PLY XRAY LF (GAUZE/BANDAGES/DRESSINGS) ×2 IMPLANT
GLOVE SURG ORTHO 8.0 STRL STRW (GLOVE) ×2 IMPLANT
GLOVE SURG SS PI 6.5 STRL IVOR (GLOVE) ×2 IMPLANT
GOWN STRL REIN 3XL LVL4 (GOWN DISPOSABLE) ×1 IMPLANT
HEMOSTAT SURGICEL .5X2 ABSORB (HEMOSTASIS) IMPLANT
KIT BASIN OR (CUSTOM PROCEDURE TRAY) ×2 IMPLANT
KIT ROOM TURNOVER OR (KITS) ×2 IMPLANT
MANIFOLD NEPTUNE WASTE (CANNULA) ×2 IMPLANT
NDL BLUNT 16X1.5 OR ONLY (NEEDLE) ×1 IMPLANT
NDL DENTAL 27 LONG (NEEDLE) IMPLANT
NEEDLE BLUNT 16X1.5 OR ONLY (NEEDLE) ×2 IMPLANT
NEEDLE DENTAL 27 LONG (NEEDLE) IMPLANT
NS IRRIG 1000ML POUR BTL (IV SOLUTION) ×2 IMPLANT
PACK EENT II TURBAN DRAPE (CUSTOM PROCEDURE TRAY) ×2 IMPLANT
PAD ARMBOARD 7.5X6 YLW CONV (MISCELLANEOUS) ×4 IMPLANT
SPONGE SURGIFOAM ABS GEL 100 (HEMOSTASIS) IMPLANT
SPONGE SURGIFOAM ABS GEL 12-7 (HEMOSTASIS) ×1 IMPLANT
SPONGE SURGIFOAM ABS GEL SZ50 (HEMOSTASIS) IMPLANT
SUCTION FRAZIER TIP 10 FR DISP (SUCTIONS) ×2 IMPLANT
SUT CHROMIC 3 0 PS 2 (SUTURE) ×3 IMPLANT
SUT CHROMIC 4 0 P 3 18 (SUTURE) IMPLANT
SYR 50ML SLIP (SYRINGE) ×2 IMPLANT
TOWEL OR 17X24 6PK STRL BLUE (TOWEL DISPOSABLE) ×2 IMPLANT
TOWEL OR 17X26 10 PK STRL BLUE (TOWEL DISPOSABLE) ×2 IMPLANT
TUBE CONNECTING 12X1/4 (SUCTIONS) ×2 IMPLANT
WATER STERILE IRR 1000ML POUR (IV SOLUTION) ×2 IMPLANT
YANKAUER SUCT BULB TIP NO VENT (SUCTIONS) ×2 IMPLANT

## 2011-06-27 NOTE — Preoperative (Signed)
Beta Blockers   Reason not to administer Beta Blockers:Not Applicable 

## 2011-06-27 NOTE — Progress Notes (Signed)
PRE OPERATIVE NOTE:  06/27/2011 Chad [name] [id number]  VITALS: BP 122/64  Pulse 79  Temp(Src) 98.8 F (37.1 C) (Oral)  Resp 22  Ht 5\' 11"  (1.803 m)  Wt 252 lb 3.3 oz (114.4 kg)  BMI 35.18 kg/m2  SpO2 100% Lab Results  Component Value Date   WBC 7.1 06/27/2011   HGB 8.0* 06/27/2011   HCT 26.6* 06/27/2011   MCV 107.7* 06/27/2011   PLT 97* 06/27/2011   BMET    Component Value Date/Time   NA 131* 06/27/2011 0409   K 4.4 06/27/2011 0409   CL 95* 06/27/2011 0409   CO2 26 06/27/2011 0409   GLUCOSE 80 06/27/2011 0409   BUN 37* 06/27/2011 0409   CREATININE 2.86* 06/27/2011 0409   CALCIUM 8.2* 06/27/2011 0409   GFRNONAA 23* 06/27/2011 0409   GFRAA 27* 06/27/2011 0409    Patient presents to the holding area for extraction of the upper left molar #14 with alveoloplasty as indicated.   SUBJECTIVE: Patient denies having any acute medical or dental changes. Patient agrees to proceed with the dental procedures as planned  EXAM: No acute dental changes seen. Tooth #14 is fractured with a history of persistent dental pain.  ASSESSMENT/PLAN: History of acute pulpitis symptoms of fractured tooth #14 with plan for extraction today with alveoloplasty as indicated.    [name], DDS

## 2011-06-27 NOTE — PMR Pre-admission (Signed)
PMR Admission Coordinator Pre-Admission Assessment  Patient: Chad [name] is an 56 y.o., male MRN: [id number] DOB: Aug 22, 1955 Height: 5\' 11"  (180.3 cm) Weight: 114.4 kg (252 lb 3.3 oz)  Insurance Information HMO:    PPO: yes     PCP:      IPA:      80/20:      OTHER:  PRIMARY: [employer] [place]      Policy#: [id number]      Subscriber: pt CM Name: Chad [name]      Phone#: [phone]     Fax#: [phone] Pre-Cert#: [id number]      Employer: [employer] Benefits:  Phone #: [phone]     Name: 5/29 Chad [name] and 5/30 Chad [name] Eff. Date: 05/27/05 active     Deduct: $500 met      Out of Pocket Max: $4000/ $4403.47 met      Life Max: none CIR: no benefit/case by case approval through their managed care benefits      SNF: 75% 120 days max Outpatient: 75% 60 visits combined     Co-Pay: 25% Home Health: 100%      Co-Pay: none 120 visits each period  DME: 75%     Co-Pay: 25% Providers: in network  SECONDARY: none       [employer] [place] had pt listed as Medicare part A primary. Patient with history of renal transplant and at one time was disabled but began work again and now no longer have medicare a and b. Medicare a 04/27/93 and termed 03/26/05. Medicare B 04/27/93 and termed 09/27/98. I worked with Theatre stage manager [id number]. We contacted employer and [place] to remove [place] as primary. [place] At [phone] ext [phone] facilitated through employer and [employer] of [place] . Update for [employer] reference number is #[id number]. It will take a few days for the system to update this change.  Medicaid Application Date:       Case Manager:  Disability Application Date:       Case Worker: Patient and fiance, Chad Bible working with his HR department about short term disability. May need to also work on long term disability.  Emergency Contact Information Contact Information    Name Relation Home Work Mobile   Chad [name] Significant other   [phone]   Chad [name]   [phone]     Current Medical  History  Patient Admitting Diagnosis: Deconditioning, critical illness myopathy  History of Present Illness: Chad [name] is an 56 y.o. Male with history of renal transplant, Hep C, admitted 06/03/11 with cough, dyspnea and fever. Patient progressed to VDRF requiring intubation 05/08. BAL postive for parainfluenza virus and patient treated with antifungal, IVIG and broad spectrum antibiotics. Patient noted to require pressors due to combination of septic and cardiogenic shock. 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 He was noted to be oliguric with lymphedema and started on CVVHD with improvement. Patient trached 5/20 and tolerating trach collar. He developed fever due to cellulitis LE and IV vancomycin X 7 days recommended by ID.   5/29. Trach downsized to #6 cuffless and PMV at all times.  Dental consult due to a toothache from fractured upper left molar May 2013.Planned extraction 5/31 . nephrolgy following for ARF HD dependent intermittently as nneded. Last treatment 06/26/11.  Past Medical History  Past Medical History  Diagnosis Date  . Hypertension   . H/O kidney transplant     Family History  family history is not on file.  Prior Rehab/Hospitalizations: none   Current Medications  Current facility-administered medications:0.9 %  sodium chloride infusion, , Intravenous, Continuous, [name], MD, Last Rate: 10 mL/hr at 06/26/11 2336;  acetaminophen (TYLENOL) tablet 650 mg, 650 mg, Oral, Once, Garnetta Buddy, MD, 650 mg at 06/26/11 1040;  albuterol (PROVENTIL) (5 MG/ML) 0.5% nebulizer solution 2.5 mg, 2.5 mg, Nebulization, Q3H PRN, Kalman Shan, MD, 2.5 mg at 06/06/11 0803 amiodarone (PACERONE) tablet 200 mg, 200 mg, Oral, Daily, Oretha Milch, MD, 200 mg at 06/27/11 1610;  antiseptic oral rinse (BIOTENE) solution 15 mL, 15 mL, Mouth Rinse, QID, [name], MD, 15 mL at 06/27/11 0402;   ceFAZolin (ANCEF) IVPB 2 g/50 mL premix, 2 g, Intravenous, 60 min Pre-Op, [name], DDS;  chlorhexidine (PERIDEX) 0.12 % solution 15 mL, 15 mL, Mouth Rinse, BID, [name], MD, 15 mL at 06/27/11 9604 collagenase (SANTYL) ointment, , Topical, Daily, [name], MD;  darbepoetin (ARANESP) 100 MCG/0.5ML injection, , , , ;  darbepoetin (ARANESP) injection 100 mcg, 100 mcg, Intravenous, Q Thu-HD, Alyson Reedy, MD, 100 mcg at 06/26/11 1200;  feeding supplement (NEPRO CARB STEADY) liquid 237 mL, 237 mL, Oral, Daily PRN, Haynes Bast, RD fentaNYL (SUBLIMAZE) injection 25 mcg, 25 mcg, Intravenous, Q1H PRN, [name], MD, 25 mcg at 06/26/11 5409;  ferric gluconate (NULECIT) 125 mg in sodium chloride 0.9 % 100 mL IVPB, 125 mg, Intravenous, Daily, Sadie Haber, MD, 125 mg at 06/26/11 1235;  HYDROcodone-acetaminophen (NORCO) 5-325 MG per tablet 1 tablet, 1 tablet, Oral, Q8H PRN, Oretha Milch, MD, 1 tablet at 06/26/11 2236 metoprolol (LOPRESSOR) injection 2.5-5 mg, 2.5-5 mg, Intravenous, Q3H PRN, Merwyn Katos, MD, 2.5 mg at 06/16/11 0028;  ondansetron (ZOFRAN) injection 4 mg, 4 mg, Intravenous, Q6H PRN, Coralyn Helling, MD, 4 mg at 06/22/11 1241;  pantoprazole (PROTONIX) EC tablet 40 mg, 40 mg, Oral, BID, Alyson Reedy, MD, 40 mg at 06/26/11 2233;  predniSONE 5 MG/5ML solution 10 mg, 10 mg, Per Tube, Q breakfast, Coralyn Helling, MD, 10 mg at 06/27/11 8119 sirolimus (RAPAMUNE) 1 MG/ML solution 0.75 mg, 0.75 mg, Oral, Daily, [name], MD, 0.75 mg at 06/26/11 1514;  DISCONTD: darbepoetin (ARANESP) injection 100 mcg, 100 mcg, Intravenous, Q Wed-HD, Cecille Aver, MD, 100 mcg at 06/18/11 1514  Patients Current Diet: NPO NPO for tooth extraction 5/31. 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[employer]; volunteers at [employer])  Current Functional Level Cognition  Arousal/Alertness: Awake/alert Overall Cognitive Status: Appears within functional limits for tasks assessed/performed Orientation Level: Oriented X4    Extremity Assessment (includes Sensation/Coordination)  RUE ROM/Strength/Tone: WFL for tasks assessed  RLE ROM/Strength/Tone: Deficits RLE ROM/Strength/Tone Deficits: grossly 3+/5    ADLs  Grooming: Performed;Wash/dry face;Set up Where Assessed - Grooming: Unsupported sitting Upper Body Bathing: Simulated;Minimal assistance Where Assessed - Upper Body Bathing: Unsupported sitting Lower Body Bathing: Simulated;+2 Total assistance Lower Body Bathing: Patient Percentage: 50% Where Assessed - Lower Body Bathing: Supported sit to stand Upper Body Dressing: Simulated;Set up Where  Assessed - Upper Body Dressing: Unsupported standing Lower Body Dressing: Performed;+1 Total assistance Where Assessed - Lower Body Dressing: Unsupported sitting Toilet Transfer: +2 Total assistance Toilet Transfer: Patient Percentage: 30% Toilet Transfer Method: Other (comment) (Utilized the Sera lift.) Toileting - Architect and Hygiene: Performed;+2 Total assistance Toileting - Architect and Hygiene: Patient Percentage: 0% Where Assessed - Engineer, mining and Hygiene: Standing Equipment Used: Rolling walker ADL Comments: Upon arrival RN stated that pt had been up in chair all night and they were unable to mobilize him back to bed.    Mobility  Bed Mobility: Sit to Supine Supine to Sit: 3: Mod  assist;With rails;HOB flat Sitting - Scoot to Edge of Bed: 5: Supervision Sit to Supine: 1: +2 Total assist;With rail;HOB flat Sit to Supine: Patient Percentage: 50%    Transfers  Transfers: Sit to Stand;Stand to Sit;Stand Pivot Transfers Sit to Stand: 1: +2 Total assist;With upper extremity assist;From chair/3-in-1 Sit to Stand: Patient Percentage: 20% Stand to Sit: 1: +2 Total assist;To bed;With upper extremity assist Stand to Sit: Patient Percentage: 20% Stand Pivot Transfers: 1: +2 Total assist Stand Pivot Transfers: Patient Percentage: 70% Transfer via Lift Equipment: Human resources officer / Gait / Stairs / Psychologist, prison and probation services  Ambulation/Gait Ambulation/Gait Assistance: Not tested (comment) Stairs: No Corporate treasurer: No    Posture / Games developer Sitting - Balance Support: Bilateral upper extremity supported;Feet supported Static Sitting - Level of Assistance: 5: Stand by assistance Static Sitting - Comment/# of Minutes: 5     Previous Home Environment Living Arrangements: Alone Lives With: Alone Available Help at Discharge:  (Fiance, son, and friends) Type of Home: House Home Layout: Two level;Able to live on main level with bedroom/bathroom Home Access: Stairs to enter Entrance Stairs-Rails: Left;Right Bathroom Shower/Tub: Tub/shower unit;Walk-in shower;Door;Curtain Bathroom Toilet: Handicapped height Bathroom Accessibility: Yes How Accessible: Accessible via walker Home Care Services: No  Discharge Living Setting Plans for Discharge Living Setting: Patient's home (24/7 to be arranged by Chad Bible, fiance) Type of Home at Discharge: House Discharge Home Layout: Two level;Able to live on main level with bedroom/bathroom;Full bath on main level Discharge Home Access: Stairs to enter Discharge Bathroom Shower/Tub: Tub/shower unit;Walk-in shower Discharge Bathroom Toilet: Handicapped height Discharge Bathroom  Accessibility: Yes How Accessible: Accessible via walker Do you have any problems obtaining your medications?: No  Social/Family/Support Systems Patient Roles: Partner;Parent;Other (Comment);Agricultural consultant (employee fulltime) Solicitor Information: Linden Dolin first, their son second. Not his stepsister Audery Amel or stepfather Anticipated Caregiver: Hennie Duos, and friends Anticipated Caregiver's Contact Information: Linden Dolin, cell [phone] Ability/Limitations of Caregiver: Chad Bible works days, but is arranging 24/7 assistance of friends Caregiver Availability: 24/7 Discharge Plan Discussed with Primary Caregiver: Yes Is Caregiver In Agreement with Plan?: Yes Does Caregiver/Family have Issues with Lodging/Transportation while Pt is in Rehab?: No  Goals/Additional Needs Patient/Family Goal for Rehab: supervision with PT, supervision to setup with OT, Mod I SLP Expected length of stay: [facility]  2 to 3 weeks Special Service Needs: renal transplant. Intermiitent dialysis since admission due to ARF. Recovery of renal function pending Additional Information: Stepsister, Audery Amel, and stepdad have been trying to assist with plans. Patient DOES NOT WANT their input or assistance Pt/Family Agrees to Admission and willing to participate: Yes Program Orientation Provided & Reviewed with Pt/Caregiver Including Roles  & Responsibilities: Yes  Patient Condition: Please see physician update to information in consult dated 06/24/11.  Preadmission Screen Completed By:  Clois Dupes,  06/27/2011 9:26 AM ______________________________________________________________________   Discussed status with Dr. Wynn Banker on 06/27/11 at [phone] and received telephone approval for admission today.  Admission Coordinator:  Clois Dupes, time 9604 Date 06/27/11.

## 2011-06-27 NOTE — Discharge Instructions (Signed)

## 2011-06-27 NOTE — Progress Notes (Signed)
Pharmacy Medication Review: Amiodarone Drug-Drug Interactions  55 YOM admitted from inpatient to Rehab on 06/27/11.   1. Amiodarone - Sirolimus (Major): Amiodarone may increase levels or effects of Sirolimus and increase risk of Sirolimus toxicity. Monitor LFTs and CBC while on Sirolimus therapy. Monitor Sirolimus levels more frequently while on duplicate therapy or if suspect toxicity--Last level was 06/04/11.   2. Amiodarone- Trazodone (Major): Concurrent use of AMIODARONE and TRAZODONE may result in increased risk of QT interval prolongation and torsade de pointes. Currently Trazodone is prn. If this medication is taken in addition to amiodarone on a regular basis, QT interval should be monitor more frequently to confirm no prolongation.   3. Amiodarone- Zofran (Major): Concurrent use of ONDANSETRON and CLASS III ANTIARRHYTHMIC AGENTS may result in an increased risk of cardiotoxicity (QT prolongation, torsades de pointes, cardiac arrest). Currently Zofran is prn.  If this medication is taken in addition to amiodarone on a regular basis, QT interval should be monitor more frequently to confirm no prolongation.   Plan: 1. Monitor QT interval while on Amiodarone therapy. Consider more frequently if Trazodone and Zofran are given on a regular basis. 2. Consider repeating Sirolimus level while on duplicate therapy of Amiodarone and Sirolimus especially if patient is experiencing signs and symptoms of Sirolimus toxicity.   Link Snuffer, PharmD, BCPS Clinical Pharmacist [phone] 06/27/2011, 6:15 PM

## 2011-06-27 NOTE — Progress Notes (Signed)
[facility] ROUNDING NOTE   Subjective:   Interval History: none.  Objective:  Vital signs in last 24 hours:  Temp:  [98.8 F (37.1 C)-101.1 F (38.4 C)] 98.8 F (37.1 C) (05/31 0748) Pulse Rate:  [79-118] 79  (05/31 0854) Resp:  [17-28] 22  (05/31 0854) BP: (98-130)/(40-77) 122/64 mmHg (05/31 0748) SpO2:  [92 %-100 %] 100 % (05/31 0854) FiO2 (%):  [23 %-28 %] 28 % (05/31 0854) Weight:  [111 kg (244 lb 11.4 oz)-114.4 kg (252 lb 3.3 oz)] 114.4 kg (252 lb 3.3 oz) (05/30 2358)  Weight change: -2.6 kg (-5 lb 11.7 oz) Filed Weights   06/26/11 0835 06/26/11 1259 06/26/11 2358  Weight: 110.4 kg (243 lb 6.2 oz) 111 kg (244 lb 11.4 oz) 114.4 kg (252 lb 3.3 oz)    Intake/Output: I/O last 3 completed shifts: In: 1160 [P.O.:680; I.V.:300; Other:180] Out: 1728 [Urine:1325; Other:402; Stool:1]   Intake/Output this shift:     CVS- RRR RS- CTA ABD- BS present soft non-distended EXT- no edema   Basic Metabolic Panel:  Lab 06/27/11 1610 06/26/11 0420 06/25/11 0818 06/24/11 0430 06/23/11 0429 06/22/11 0411 06/21/11 0430  NA 131* 129* 130* 137 140 -- --  K 4.4 5.6* 5.7* 5.0 4.8 -- --  CL 95* 95* 93* 100 104 -- --  CO2 26 19 23 23 24  -- --  GLUCOSE 80 106* 83 112* 95 -- --  BUN 37* 97* 94* 85* 60* -- --  CREATININE 2.86* 4.83* 4.69* 4.11* 2.89* -- --  CALCIUM 8.2* 8.7 8.8 -- -- -- --  MG -- -- -- -- -- 2.5 2.4  PHOS 4.9* 7.7* 7.5* 6.8* 5.6* -- --    Liver Function Tests:  Lab 06/27/11 0409 06/26/11 0420 06/25/11 0818 06/24/11 0430 06/23/11 0429 06/22/11 0411  AST -- -- -- -- -- 154*  ALT -- -- -- -- -- 61*  ALKPHOS -- -- -- -- -- 259*  BILITOT -- -- -- -- -- 2.2*  PROT -- -- -- -- -- 8.9*  ALBUMIN 1.9* 1.9* 2.0* 2.0* 2.1* --   No results found for this basename: LIPASE:5,AMYLASE:5 in the last 168 hours No results found for this basename: AMMONIA:3 in the last 168 hours  CBC:  Lab 06/26/11 0935 06/25/11 0818 06/24/11 0430 06/23/11 0429 06/22/11 0411    WBC 8.8 14.0* 17.9* 19.7* 17.3*  NEUTROABS -- -- -- -- --  HGB 8.2* 8.9* 8.5* 8.8* 9.2*  HCT 26.4* 29.1* 28.7* 30.8* 32.4*  MCV 105.2* 107.0* 111.2* 112.8* 112.5*  PLT 91* 73* 72* 85* 92*    Cardiac Enzymes: No results found for this basename: CKTOTAL:5,CKMB:5,CKMBINDEX:5,TROPONINI:5 in the last 168 hours  BNP: No components found with this basename: POCBNP:5  CBG: No results found for this basename: GLUCAP:5 in the last 168 hours  Microbiology: Results for orders placed during the hospital encounter of 06/03/11  URINE CULTURE     Status: Normal   Collection Time   06/03/11 10:53 PM      Component Value Range Status Comment   Specimen Description URINE, RANDOM   Final    Special Requests ADDED [id number]   Final    Culture  Setup Time [id number]   Final    Colony Count >=100,000 COLONIES/ML   Final    Culture     Final    Value: Multiple bacterial morphotypes present, none predominant. Suggest appropriate recollection if clinically indicated.   Report Status 06/05/2011 FINAL   Final   CULTURE, BLOOD (  ROUTINE X 2)     Status: Normal   Collection Time   06/03/11 11:45 PM      Component Value Range Status Comment   Specimen Description BLOOD RIGHT ARM   Final    Special Requests BOTTLES DRAWN AEROBIC AND ANAEROBIC 10CC   Final    Culture  Setup Time [id number]   Final    Culture NO GROWTH 5 DAYS   Final    Report Status 06/10/2011 FINAL   Final   CULTURE, BLOOD (ROUTINE X 2)     Status: Normal   Collection Time   06/03/11 11:50 PM      Component Value Range Status Comment   Specimen Description BLOOD RIGHT ARM   Final    Special Requests     Final    Value: BOTTLES DRAWN AEROBIC AND ANAEROBIC 5CC AEROBOC 3CC ANAEROBIC   Culture  Setup Time [id number]   Final    Culture NO GROWTH 5 DAYS   Final    Report Status 06/10/2011 FINAL   Final   MRSA PCR SCREENING     Status: Normal   Collection Time   06/04/11  3:08 AM      Component Value Range Status Comment   MRSA by PCR  NEGATIVE  NEGATIVE  Final   RESPIRATORY VIRUS PANEL (18 COMPONENTS)     Status: Abnormal   Collection Time   06/04/11  6:09 AM      Component Value Range Status Comment   Source - RVPAN NASAL MUCOSA   Final    Respiratory Syncytial Virus A NOT DETECTED   Final    Respiratory Syncytial Virus B NOT DETECTED   Final    Influenza A NOT DETECTED   Final    Influenza B NOT DETECTED   Final    Parainfluenza 1 NOT DETECTED   Final    Parainfluenza 2 NOT DETECTED   Final    Parainfluenza 3 DETECTED (*)  Final    Parainfluenza 4 NOT DETECTED   Final    Metapneumovirus NOT DETECTED   Final    Coxsackie and Echovirus NOT DETECTED   Final    Rhinovirus NOT DETECTED   Final    Adenovirus B NOT DETECTED   Final    Adenovirus E NOT DETECTED   Final    CoronavirusNL63 NOT DETECTED   Final    CoronavirusHKU1 NOT DETECTED   Final    Coronavirus229E NOT DETECTED   Final    CoronavirusOC43 NOT DETECTED   Final   CULTURE, EXPECTORATED SPUTUM-ASSESSMENT     Status: Normal   Collection Time   06/04/11  7:48 AM      Component Value Range Status Comment   Specimen Description SPUTUM   Final    Special Requests NONE   Final    Sputum evaluation     Final    Value: MICROSCOPIC FINDINGS SUGGEST THAT THIS SPECIMEN IS NOT REPRESENTATIVE OF LOWER RESPIRATORY SECRETIONS. PLEASE RECOLLECT.     CALLED TO D ORTIZ,RN 06/04/11 0911 BY K SCHULTZ   Report Status 06/04/2011 FINAL   Final   AFB CULTURE WITH SMEAR     Status: Normal (Preliminary result)   Collection Time   06/04/11 10:30 AM      Component Value Range Status Comment   Specimen Description BRONCHIAL WASHINGS   Final    Special Requests NONE   Final    ACID FAST SMEAR NO ACID FAST BACILLI SEEN   Final  Culture     Final    Value: CULTURE WILL BE EXAMINED FOR 6 WEEKS BEFORE ISSUING A FINAL REPORT   Report Status PENDING   Incomplete   PNEUMOCYSTIS JIROVECI SMEAR BY DFA     Status: Normal   Collection Time   06/04/11 10:30 AM      Component Value Range Status  Comment   Specimen Source-PJSRC BRONCHIAL WASHINGS   Final    Pneumocystis jiroveci Ag NEGATIVE   Final Performed at [facility]  FUNGUS CULTURE W SMEAR     Status: Normal (Preliminary result)   Collection Time   06/04/11 10:30 AM      Component Value Range Status Comment   Specimen Description BRONCHIAL WASHINGS   Final    Special Requests NONE   Final    Fungal Smear NO YEAST OR FUNGAL ELEMENTS SEEN   Final    Culture CULTURE IN PROGRESS FOR FOUR WEEKS   Final    Report Status PENDING   Incomplete   CULTURE, RESPIRATORY     Status: Normal   Collection Time   06/04/11 10:30 AM      Component Value Range Status Comment   Specimen Description BRONCHIAL WASHINGS   Final    Special Requests NONE   Final    Gram Stain     Final    Value: FEW WBC PRESENT,BOTH PMN AND MONONUCLEAR     NO SQUAMOUS EPITHELIAL CELLS SEEN     NO ORGANISMS SEEN   Culture NO GROWTH 2 DAYS   Final    Report Status 06/07/2011 FINAL   Final   HSV PCR     Status: Normal   Collection Time   06/04/11 10:30 AM      Component Value Range Status Comment   HSV, PCR Not Detected  Not Detected  Final    HSV 2 , PCR Not Detected  Not Detected  Final    Specimen Source-HSVPCR NO GROWTH   Final   CULTURE, BLOOD (ROUTINE X 2)     Status: Normal   Collection Time   06/04/11  3:15 PM      Component Value Range Status Comment   Specimen Description BLOOD RIGHT HAND   Final    Special Requests BOTTLES DRAWN AEROBIC AND ANAEROBIC 10CC   Final    Culture  Setup Time [id number]   Final    Culture NO GROWTH 5 DAYS   Final    Report Status 06/11/2011 FINAL   Final   CULTURE, BLOOD (ROUTINE X 2)     Status: Normal   Collection Time   06/04/11  3:27 PM      Component Value Range Status Comment   Specimen Description BLOOD LEFT HAND   Final    Special Requests BOTTLES DRAWN AEROBIC ONLY 3CC   Final    Culture  Setup Time [id number]   Final    Culture NO GROWTH 5 DAYS   Final    Report Status 06/11/2011 FINAL   Final    CULTURE, BLOOD (ROUTINE X 2)     Status: Normal   Collection Time   06/08/11  4:15 AM      Component Value Range Status Comment   Specimen Description BLOOD LEFT HAND   Final    Special Requests BOTTLES DRAWN AEROBIC ONLY 10CC   Final    Culture  Setup Time [id number]   Final    Culture NO GROWTH 5 DAYS   Final  Report Status 06/14/2011 FINAL   Final   CULTURE, BLOOD (ROUTINE X 2)     Status: Normal   Collection Time   06/08/11  4:20 AM      Component Value Range Status Comment   Specimen Description BLOOD LEFT HAND   Final    Special Requests BOTTLES DRAWN AEROBIC ONLY 10CC   Final    Culture  Setup Time [id number]   Final    Culture NO GROWTH 5 DAYS   Final    Report Status 06/14/2011 FINAL   Final   RESPIRATORY VIRUS PANEL (18 COMPONENTS)     Status: Abnormal   Collection Time   06/11/11  8:33 PM      Component Value Range Status Comment   Source - RVPAN BRONCHIAL ALVEOLAR LAVAGE   Final    Respiratory Syncytial Virus A NOT DETECTED   Final    Respiratory Syncytial Virus B NOT DETECTED   Final    Influenza A NOT DETECTED   Final    Influenza B NOT DETECTED   Final    Parainfluenza 1 NOT DETECTED   Final    Parainfluenza 2 NOT DETECTED   Final    Parainfluenza 3 DETECTED (*)  Final    Parainfluenza 4 NOT DETECTED   Final    Metapneumovirus NOT DETECTED   Final    Coxsackie and Echovirus NOT DETECTED   Final    Rhinovirus NOT DETECTED   Final    Adenovirus B NOT DETECTED   Final    Adenovirus E NOT DETECTED   Final    CoronavirusNL63 NOT DETECTED   Final    CoronavirusHKU1 NOT DETECTED   Final    Coronavirus229E NOT DETECTED   Final    CoronavirusOC43 NOT DETECTED   Final   CLOSTRIDIUM DIFFICILE BY PCR     Status: Normal   Collection Time   06/13/11  1:14 PM      Component Value Range Status Comment   C difficile by pcr NEGATIVE  NEGATIVE  Final   CLOSTRIDIUM DIFFICILE BY PCR     Status: Normal   Collection Time   06/19/11 10:25 AM      Component Value Range  Status Comment   C difficile by pcr NEGATIVE  NEGATIVE  Final   CULTURE, BLOOD (ROUTINE X 2)     Status: Normal   Collection Time   06/19/11 10:57 AM      Component Value Range Status Comment   Specimen Description BLOOD LEFT ARM   Final    Special Requests BOTTLES DRAWN AEROBIC AND ANAEROBIC 10CC   Final    Culture  Setup Time [id number]   Final    Culture NO GROWTH 5 DAYS   Final    Report Status 06/25/2011 FINAL   Final   CULTURE, BLOOD (ROUTINE X 2)     Status: Normal   Collection Time   06/19/11 10:58 AM      Component Value Range Status Comment   Specimen Description BLOOD LEFT HAND   Final    Special Requests BOTTLES DRAWN AEROBIC AND ANAEROBIC 10CC   Final    Culture  Setup Time [id number]   Final    Culture NO GROWTH 5 DAYS   Final    Report Status 06/25/2011 FINAL   Final     Coagulation Studies: No results found for this basename: LABPROT:5,INR:5 in the last 72 hours  Urinalysis: No results found for this basename: COLORURINE:2,APPERANCEUR:2,LABSPEC:2,PHURINE:2,GLUCOSEU:2,HGBUR:2,BILIRUBINUR:2,KETONESUR:2,PROTEINUR:2,UROBILINOGEN:2,NITRITE:2,LEUKOCYTESUR:2 in the last 72  hours    Imaging: No results found.   Medications:      . sodium chloride 10 mL/hr at 06/26/11 2336      . acetaminophen  650 mg Oral Once  . amiodarone  200 mg Oral Daily  . antiseptic oral rinse  15 mL Mouth Rinse QID  .  ceFAZolin (ANCEF) IV  2 g Intravenous 60 min Pre-Op  . chlorhexidine  15 mL Mouth Rinse BID  . collagenase   Topical Daily  . darbepoetin      . darbepoetin (ARANESP) injection - DIALYSIS  100 mcg Intravenous Q Thu-HD  . ferric gluconate (FERRLECIT/NULECIT) IV  125 mg Intravenous Daily  . pantoprazole  40 mg Oral BID  . predniSONE  10 mg Per Tube Q breakfast  . sirolimus  0.75 mg Oral Daily  . DISCONTD: darbepoetin (ARANESP) injection - DIALYSIS  100 mcg Intravenous Q Wed-HD   albuterol, feeding supplement (NEPRO CARB STEADY), fentaNYL, HYDROcodone-acetaminophen,  metoprolol, ondansetron (ZOFRAN) IV  Assessment/ Plan:  is a 56 y.o. yo male s/p renal transplant who was admitted on 06/03/2011 with PNA pulm HTN, volume overload now with acute on chronic was dialysis  1. PNA/parainfluenza/cardiomyopathy/hypotension- completed RX for paraflu. Has trach and requiring vent support. Slow imp in CXR BP stable but soft no pressors 2. AKI, oliguric, HD dependent with CRRT 5/13-No pulm edema. Do intermittent HD as needed. Will need dialysis today 3. 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 He is oliguric. Blood sirolimus level on 5/8 at 5 pm was 2.0  4. Anemia- hgb stable. Aranesp. Iron. 5. Secondary hyperparathyroidism- no treatment right now, rocaltrol stopped - not essential at this time and calcium up 6. PNA parainfluenza- finished course . Urine output great. Will watch for recovery and dialyze PRN        LOS: 24 Chad [name] W @TODAY @9 :17 AM

## 2011-06-27 NOTE — Progress Notes (Signed)
Patient arrived on rehab by stretcher .  Went over care plan,safety plan with patient. Explained phone call light , did butterfly list . I started assessment .

## 2011-06-27 NOTE — Progress Notes (Signed)
Subjective: Patient denies SOB  No CP  Appetite good. Objective: Filed Vitals:   06/26/11 2200 06/26/11 2358 06/27/11 0337 06/27/11 0748  BP:  98/71 105/77 122/64  Pulse:  91 84 84  Temp: 99.9 F (37.7 C) 99.4 F (37.4 C) 98.8 F (37.1 C) 98.8 F (37.1 C)  TempSrc: Oral Oral Oral Oral  Resp:  22 17 23   Height:      Weight:  252 lb 3.3 oz (114.4 kg)    SpO2:  98% 100% 100%   Weight change: -5 lb 11.7 oz (-2.6 kg)  Intake/Output Summary (Last 24 hours) at 06/27/11 1914 Last data filed at 06/27/11 7829  Gross per 24 hour  Intake   1030 ml  Output   1053 ml  Net    -23 ml    General: Alert, awake, oriented x3, in no acute distress Neck:  JVP is difficult to assess given trach collar. Heart: Regular rate and rhythm, without murmurs, rubs, gallops.  Lungs: Bilateral rhonchi, rales at bases.  No wheezes. Abd:  No hepatomeg or RUQ tenderness. Exemities:  No edema.   Neuro: Grossly intact, nonfocal.  TELE:  SR  80s  Lab Results: Results for orders placed during the hospital encounter of 06/03/11 (from the past 24 hour(s))  CBC     Status: Abnormal   Collection Time   06/26/11  9:35 AM      Component Value Range   WBC 8.8  4.0 - 10.5 (K/uL)   RBC 2.51 (*) 4.22 - 5.81 (MIL/uL)   Hemoglobin 8.2 (*) 13.0 - 17.0 (g/dL)   HCT 56.2 (*) 13.0 - 52.0 (%)   MCV 105.2 (*) 78.0 - 100.0 (fL)   MCH 32.7  26.0 - 34.0 (pg)   MCHC 31.1  30.0 - 36.0 (g/dL)   RDW 86.5 (*) 78.4 - 15.5 (%)   Platelets 91 (*) 150 - 400 (K/uL)  HEPATITIS B SURFACE ANTIGEN     Status: Normal   Collection Time   06/26/11 10:27 AM      Component Value Range   Hepatitis B Surface Ag NEGATIVE  NEGATIVE   HEPATITIS B SURFACE ANTIBODY     Status: Abnormal   Collection Time   06/26/11 10:27 AM      Component Value Range   Hep B S Ab POSITIVE (*) NEGATIVE   RENAL FUNCTION PANEL     Status: Abnormal   Collection Time   06/27/11  4:09 AM      Component Value Range   Sodium 131 (*) 135 - 145 (mEq/L)   Potassium  4.4  3.5 - 5.1 (mEq/L)   Chloride 95 (*) 96 - 112 (mEq/L)   CO2 26  19 - 32 (mEq/L)   Glucose, Bld 80  70 - 99 (mg/dL)   BUN 37 (*) 6 - 23 (mg/dL)   Creatinine, Ser 6.96 (*) 0.50 - 1.35 (mg/dL)   Calcium 8.2 (*) 8.4 - 10.5 (mg/dL)   Phosphorus 4.9 (*) 2.3 - 4.6 (mg/dL)   Albumin 1.9 (*) 3.5 - 5.2 (g/dL)   GFR calc non Af Amer 23 (*) >90 (mL/min)   GFR calc Af Amer 27 (*) >90 (mL/min)    Studies/Results: No results found.  Medications: I have reviewed the patient's current medications.   Patient Active Hospital Problem List:  Parainfluenza pneumonia   Assessment: Managed by CCM  Trach.     Renal transplant disorder (06/05/2011)   Assessment: Renal following.  Oliguric.  Undergoing HD  Cardiomyopathy (06/10/2011)  Assessment: Repeat echo showed mild improvement in LVEF   Currently not on any agents for CHF.  WOuld be good to start low dose B Blocker (lopressor 6.25 bid)  WIll review with renal.  BP is better as morning has progressed (120s) but do not want to compromise with dialysis.  If BP improves further would like to add Hydral/NTG.  Atrial flutter:  No Recurrence.  On amio.200.  Should be on an anticoagulant given LV dysfunction.  Would rec coumadin but again will review with CCM before initiating.  Hgb will need to be followed closely.   Patient will need long term f/u for CHF.    LOS: 24 days   Dietrich Pates 06/27/2011, 8:12 AM

## 2011-06-27 NOTE — H&P (Signed)
Physical Medicine and Rehabilitation Admission H&P    Chief Complaint  Patient presents with  . Deconditioned, critical illness myopathy.  : HPI:  Chad [name] is an 56 y.o. Male with history of renal transplant, Hep C, admitted 06/03/11 with cough, dyspnea and fever. Patient progressed to VDRF requiring intubation 05/08. BAL postive for parainfluenza virus and patient treated with antifungal, IVIG and broad spectrum antibiotics. Patient noted to require pressors due to combination of septic and cardiogenic shock. 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 He was noted to be oliguric with lymphedema and started on CVVHD with improvement. Patient trached 5/20 and tolerating trach collar.  He developed fever due to cellulitis LE and IV vancomycin X 7 days recommended by ID. 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  Required transfusion today.  Downsized to CFS #6 on 05/29. Patient developed acute tooth ache due to pulpitis symptoms and for dental extraction today. Continues on amiodarone for atrial flutter with ?coumadin for treatment.  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  Review of Systems  HENT: Negative for hearing loss.        Tooth pain.  Respiratory: Positive for cough and sputum production. Negative for shortness of breath and wheezing.   Cardiovascular: Positive for leg swelling. Negative for chest pain and palpitations.  Gastrointestinal: Negative for heartburn, nausea and diarrhea.  Genitourinary: Negative for urgency and frequency.  Musculoskeletal: Negative for myalgias.  Neurological: Negative for headaches.  Psychiatric/Behavioral: The patient is not nervous/anxious and does not have insomnia.    Past Medical History  Diagnosis Date  . Hypertension   . H/O kidney transplant    Past Surgical History  Procedure Date  .  Nephrectomy transplanted organ   . Insertion of dialysis catheter 06/20/2011    Procedure: INSERTION OF DIALYSIS CATHETER;  Surgeon: [name], MD;  Location: [facility] OR;  Service: Vascular;  Laterality: Right;  Ultrasound guided insertion of right internal jugular dialysis catheter   No family history on file. Social History:  reports that he has never smoked. He does not have any smokeless tobacco history on file. He reports that he does not drink alcohol. His drug history not on file. Allergies: No Known Allergies Medications Prior to Admission  Medication Sig Dispense Refill  . acetaminophen (TYLENOL) 500 MG tablet Take 1,000 mg by mouth every 6 (six) hours as needed. For pain.      Marland Kitchen allopurinol (ZYLOPRIM) 100 MG tablet Take 100 mg by mouth daily.      . calcitRIOL (ROCALTROL) 0.5 MCG capsule Take 0.5-1 mcg by mouth daily. 2 tablets Monday Wednesday and Friday 1 tablet all other days      . diltiazem (DILACOR XR) 240 MG 24 hr capsule Take 240 mg by mouth daily.      . predniSONE (DELTASONE) 10 MG tablet Take 10 mg by mouth daily.      . sildenafil (VIAGRA) 50 MG tablet Take 50 mg by mouth daily as needed. For erectile dysfunction      . simvastatin (ZOCOR) 40 MG tablet Take 40 mg by mouth every evening.      . sirolimus (RAPAMUNE) 1 MG/ML solution Take 0.75 mg by mouth daily.      Marland Kitchen torsemide (DEMADEX) 20 MG tablet Take 20-40 mg by mouth daily. 2 tablets in the morning and 1-2 tablets in the evening  Home:     Functional History:    Functional Status:  Mobility:          ADL:    Cognition:       There were no vitals taken for this visit. Physical Exam  Nursing note and vitals reviewed. Constitutional: He is oriented to person, place, and time. He appears well-developed and well-nourished.  HENT:  Head: Normocephalic and atraumatic.  Eyes: Pupils are equal, round, and reactive to light.  Neck:       #6 CFS in place.  Duoderm covering eroded area under  trach.   Cardiovascular: Normal rate and regular rhythm.   Pulmonary/Chest: Effort normal.       Coarse breath sounds with rhonchi.  Coughing with deep breaths.   Abdominal: Soft. Bowel sounds are normal.  Musculoskeletal: He exhibits edema and tenderness.       4+edema left foot.  3+ left tibially with diffuse erythema on lower half of shin and bullous fluid areas noted. 2+ edema R-shin/right foot  with mild erythema.  R shin tender to touch.  2cm ulcer on left calf- shallow crater with yellow eschar with macerated edges and surrounding erythema.  Neurological: He is alert and oriented to person, place, and time.  Skin: Skin is warm and dry.  Psychiatric: He has a normal mood and affect. His behavior is normal. Judgment and thought content normal.  motor strength is 3 minus/5 in both hip flexors and knee extensors 4/5 in both ankle dorsiflexors and plantar flexors 4/5 in bilateral deltoid, biceps, triceps, grip  Results for orders placed during the hospital encounter of 06/03/11 (from the past 48 hour(s))  RENAL FUNCTION PANEL     Status: Abnormal   Collection Time   06/26/11  4:20 AM      Component Value Range Comment   Sodium 129 (*) 135 - 145 (mEq/L)    Potassium 5.6 (*) 3.5 - 5.1 (mEq/L)    Chloride 95 (*) 96 - 112 (mEq/L)    CO2 19  19 - 32 (mEq/L)    Glucose, Bld 106 (*) 70 - 99 (mg/dL)    BUN 97 (*) 6 - 23 (mg/dL)    Creatinine, Ser 1.61 (*) 0.50 - 1.35 (mg/dL)    Calcium 8.7  8.4 - 10.5 (mg/dL)    Phosphorus 7.7 (*) 2.3 - 4.6 (mg/dL)    Albumin 1.9 (*) 3.5 - 5.2 (g/dL)    GFR calc non Af Amer 12 (*) >90 (mL/min)    GFR calc Af Amer 14 (*) >90 (mL/min)   CBC     Status: Abnormal   Collection Time   06/26/11  9:35 AM      Component Value Range Comment   WBC 8.8  4.0 - 10.5 (K/uL)    RBC 2.51 (*) 4.22 - 5.81 (MIL/uL)    Hemoglobin 8.2 (*) 13.0 - 17.0 (g/dL)    HCT 09.6 (*) 04.5 - 52.0 (%)    MCV 105.2 (*) 78.0 - 100.0 (fL)    MCH 32.7  26.0 - 34.0 (pg)    MCHC 31.1  30.0 -  36.0 (g/dL)    RDW 40.9 (*) 81.1 - 15.5 (%)    Platelets 91 (*) 150 - 400 (K/uL) CONSISTENT WITH PREVIOUS RESULT  HEPATITIS B CORE ANTIBODY, TOTAL     Status: Abnormal   Collection Time   06/26/11 10:27 AM      Component Value Range Comment   Hep B Core Total Ab POSITIVE (*) NEGATIVE  Result  repeated and verified.  HEPATITIS B SURFACE ANTIGEN     Status: Normal   Collection Time   06/26/11 10:27 AM      Component Value Range Comment   Hepatitis B Surface Ag NEGATIVE  NEGATIVE    HEPATITIS B SURFACE ANTIBODY     Status: Abnormal   Collection Time   06/26/11 10:27 AM      Component Value Range Comment   Hep B S Ab POSITIVE (*) NEGATIVE    RENAL FUNCTION PANEL     Status: Abnormal   Collection Time   06/27/11  4:09 AM      Component Value Range Comment   Sodium 131 (*) 135 - 145 (mEq/L)    Potassium 4.4  3.5 - 5.1 (mEq/L)    Chloride 95 (*) 96 - 112 (mEq/L)    CO2 26  19 - 32 (mEq/L)    Glucose, Bld 80  70 - 99 (mg/dL)    BUN 37 (*) 6 - 23 (mg/dL)    Creatinine, Ser 1.61 (*) 0.50 - 1.35 (mg/dL) DELTA CHECK NOTED   Calcium 8.2 (*) 8.4 - 10.5 (mg/dL)    Phosphorus 4.9 (*) 2.3 - 4.6 (mg/dL)    Albumin 1.9 (*) 3.5 - 5.2 (g/dL)    GFR calc non Af Amer 23 (*) >90 (mL/min)    GFR calc Af Amer 27 (*) >90 (mL/min)   SURGICAL PCR SCREEN     Status: Abnormal   Collection Time   06/27/11  7:47 AM      Component Value Range Comment   MRSA, PCR NEGATIVE  NEGATIVE     Staphylococcus aureus POSITIVE (*) NEGATIVE    CBC     Status: Abnormal   Collection Time   06/27/11  9:54 AM      Component Value Range Comment   WBC 7.1  4.0 - 10.5 (K/uL)    RBC 2.47 (*) 4.22 - 5.81 (MIL/uL)    Hemoglobin 8.0 (*) 13.0 - 17.0 (g/dL)    HCT 09.6 (*) 04.5 - 52.0 (%)    MCV 107.7 (*) 78.0 - 100.0 (fL)    MCH 32.4  26.0 - 34.0 (pg)    MCHC 30.1  30.0 - 36.0 (g/dL)    RDW 40.9 (*) 81.1 - 15.5 (%)    Platelets 97 (*) 150 - 400 (K/uL) CONSISTENT WITH PREVIOUS RESULT   No results found.  Post Admission  Physician Evaluation: 1. 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 2. Patient is admitted to receive collaborative, interdisciplinary care between the physiatrist, rehab nursing staff, and therapy team. 3. Patient's level of medical complexity and substantial therapy needs in context of that medical necessity cannot be provided at a lesser intensity of care such as a SNF. 4. Patient has experienced substantial functional loss from his/her baseline which was documented above under the "Functional History" and "Functional Status" headings.  Judging by the patient's diagnosis, physical exam, and functional history, the patient has potential for functional progress which will result in measurable gains while on inpatient rehab.  These gains will be of substantial and practical use upon discharge  in facilitating mobility and self-care at the household level. 5. Physiatrist will provide 24 hour management of medical needs as well as oversight of the therapy plan/treatment and provide guidance as appropriate regarding the interaction of the two. 6. 24 hour rehab nursing will assist with bladder management, bowel management, safety, skin/wound care, disease management and medication administration  and help integrate therapy concepts, techniques,education, etc. 7. PT will assess and treat for:  Pre-gait training gait training endurance safety equipment.  Goals are: supervision to minimal assistance with mobility. 8. OT will assess and treat for: ADLs, equipment, safety, endurance.   Goals are: modified independent upper body ADLs and min assist lower body ADLs. 9. SLP will assess and treat for: eval swallow function and expressive communication.  Goals are: safe by mouth intake. 10. Case Management and Social Worker will assess and treat for psychological issues and discharge planning. 11. Team conference will be held weekly to assess progress toward  goals and to determine barriers to discharge. 12.  Patient will receive at least 3 hours of therapy per day at least 5 days per week. 13. ELOS: 2 weeks      Prognosis:  good   Medical Problem List and Plan: 1. DVT Prophylaxis/Anticoagulation: Mechanical: Sequential compression devices, below knee Bilateral lower extremities 2. Pain Management:  Continue hydrocodone prn 3. Mood:  Monitor for now. Seems very motivated. 4. Renal transplant with chronic allograft nephropathy: continue HD on prn basis per nephrology.  Fluid overload improving. Strict I and O. Continue prednisone and sirolimus. 5. Left shin ulcer:  Will continue santyl but change foam dressing to wet to dry to help with debridement of yellow eschar.  Will get WOC input. 6. Afib:  Continue amiodarone. ?coumadin per cardiology. 7. Anemia of chronic disease:  Last dose IV iron tomorrow.  Continue aranesp weekly.   06/27/2011, 5:33 PM

## 2011-06-27 NOTE — Progress Notes (Signed)
PT Cancellation Note  Treatment cancelled today due to in OR for teeth extraction.    [name],Dare Sanger 06/27/2011, 12:50 PM  [facility] [phone] (pager)

## 2011-06-27 NOTE — Progress Notes (Signed)
ASSName: Chad [name] MRN: [id number] DOB: Sep 07, 1955    LOS: 24  PCCM  NOTE  History of Present Illness:  56 yo male admitted 06/03/2011 with cough, dyspnea and fever.  Progressed to VDRF 05/08 and PCCM consulted.  Has hx of renal transplant. PMHx Hep C, HTN, Gout  Cultures: BAL bronch 5/8>>>neg    AFB >> neg   Viral panel >> POS parainfluenza virus   Pneumocystis >> neg   Fungus >> neg   HSV >> neg BC 5/7>>>neg  Blood X 2 5/12 >> neg  Antibiotics: (per ID) 5/7 levoflox>>>5/10 5/7 bactrim >>>5/10 5/7 micafungin >> 5/14 5/7 vanc >> 5/10 5/10 cefepime >> 5/15 5/15 Ribaviran/IVIG >>Completed  Tests / Events: 5/8 hypoxic resp failure, intubated, near arrest 5/11 worsening shock , on sepsis protocol + dobut for low co-ox 5/15 Family mtg: continue full aggressive support but NCB if arrests. To reassess in 4-5 days. If no progress, consider withdrawal at that time 5/15 Trial of Ribaviran/IVIG 5/16 Markedly improved - reduced pressors, improved cognition, gas exchange improved 5/17 continued improvement. Tolerates PS 5 cm H2O with Vt > 800cc 5/17- extubated, re intubated 5/20- trach 5/24 Tunneled HD cath placed 5/26 CRRT stopped 5/27 Speech valve fitted. 5/8 diet - regular, thin 5/28 Dental consult called 5/29 downsized to 6 cuffless  Subjective: Tolerating trach collar.  Speaking with speech valve.  Slept well C/o tooth pain left upper molar - better  Vital Signs: Temp:  [97.4 F (36.3 C)-101.1 F (38.4 C)] 98.6 F (37 C) (05/31 1440) Pulse Rate:  [74-94] 74  (05/31 1530) Resp:  [17-31] 21  (05/31 1530) BP: (98-138)/(61-83) 138/72 mmHg (05/31 1440) SpO2:  [94 %-100 %] 100 % (05/31 1530) FiO2 (%):  [28 %-35 %] 28 % (05/31 1530) Weight:  [252 lb 3.3 oz (114.4 kg)] 252 lb 3.3 oz (114.4 kg) (05/30 2358) I/O last 3 completed shifts: In: 1160 [P.O.:680; I.V.:300; Other:180] Out: 1728 [Urine:1325; Other:402; Stool:1]   Physical Examination: General - no  distress HEENT - trach site clean. Left upper molar blackened root Cardiac - s1s2 regular, no murmur Chest - scattered rhonchi Abd - soft, non tender, +bowel sounds Ext - 2+edema Neuro - alert, follows commands, moves all extremities   Ventilator settings: Vent Mode:  [-]  FiO2 (%):  [28 %-35 %] 28 %   Lab 06/27/11 0409 06/26/11 0420 06/25/11 0818  NA 131* 129* 130*  K 4.4 5.6* 5.7*  CL 95* 95* 93*  CO2 26 19 23   BUN 37* 97* 94*  CREATININE 2.86* 4.83* 4.69*  GLUCOSE 80 106* 83    Lab 06/27/11 0954 06/26/11 0935 06/25/11 0818  HGB 8.0* 8.2* 8.9*  HCT 26.6* 26.4* 29.1*  WBC 7.1 8.8 14.0*  PLT 97* 91* 73*        Assessment and Plan:  PULMONARY  Bronchoscopy 05/08>>diffuse, thick secretions ETT 5/8 >>5/17>>>retubed 6 hrs>>>5/20 Trach 5/20 (df)>>>  Acute respiratory failure 2nd to pneumonia, ARDS.  Required trach 05/20. Plan: -last CXR 5/26 - lt basilar ASD -trach collar as tolerated>> -speech valve fitted - downsized trach - decannulate at some point once ambulating   CARDIAC Lt IJ CVL 05/08>>5/28 Rt Radial aline 05/08>>out R IJ HD cath 5/12 >> 5/13 (inadvertent removal) R IJ HD cath 5/13 >>5/24 Tunneled HD cath 5/24>>   5/08 Doppler legs>>negative for DVT 5/12 Echo>>EF 15 to 20%, mild LVH, diffuse hypokinesis, mild MR, mod/severe LA dilation, mod RV systolic dysfx, PAS 52 mmHg 5/23 Doppler legs>>negative for DVT  Shock>>likely  septic and cardiogenic.  Resolved.  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 Plan: -continue po amiodarone per cardiology -? anticoagulation   RENAL  Acute on chronic renal failure with hx of renal transplant.  CVVH started 5/12>>stopped 5/26. Plan: -HD per renal -continue sirolimus, prednisone -dc potassium supplement   ID  Blood 5/23>>ng C diff 5/23>>negative  Vancomycin 5/23>> 5/28  Parainfluenza pneumonia.  Given IVIG  5/15.  Ribavarin started 5/16, and completed 7 day course. Plan: -monitor clinically off therapy  Cellulitis. -off abx per ID  Fever.  No recurrence since 5/22.     Tooth pain. -extraction planned 5/31   HEMATOLOGY  Basename 06/27/11 0954 06/26/11 0935  HGB 8.0* 8.2*   Anemia of critical illness, chronic disease, and renal failure. Plan: -f/u CBC -transfuse for Hb < 7 -continue nulecit, aranesp per renal  Leukocytosis>>improving.  Thrombocytopenia. Plan: -f/u CBC   ENDOCRINE  Steroid induced hyperglycemia. CBG (last 3)  No results found for this basename: GLUCAP:3 in the last 72 hours   Plan: -improved, monitor blood sugar on BMET   GASTROENTEROLOGY  Nurtition. Plan: -swallow eval - regular , thin liquids   Hx of Hepatitis C. Plan: -f/u LFT intermittently  DISPO - accepted to rehab, hopefully 5/31 after dental procedure  BEST PRACTICE -BID protonix for SUP -SCD for DVT prophylaxis -Full code -PT/OT  -keep in SDU, to rehab after extraction  Keionte Swicegood V.   [phone] 06/27/2011, 5:19 PM

## 2011-06-27 NOTE — Progress Notes (Signed)
Discussed with Dr. Vassie Loll this morning. Noted planned tooth extraction today. I have insurance approval and can admit patient today after recovery postoperatively. Patient aware and in agreement. I have alerted RN CM. Please call [phone] with questions.

## 2011-06-27 NOTE — Transfer of Care (Signed)
Immediate Anesthesia Transfer of Care Note  Patient: Chad [name]  Procedure(s) Performed: Procedure(s) (LRB): MULTIPLE EXTRACION WITH ALVEOLOPLASTY (N/A)  Patient Location: PACU  Anesthesia Type: MAC  Level of Consciousness: awake, alert  and patient cooperative  Airway & Oxygen Therapy: Patient Spontanous Breathing and Patient connected to tracheostomy mask oxygen  Post-op Assessment: Report given to PACU RN and Post -op Vital signs reviewed and stable  Post vital signs: Reviewed and stable  Complications: No apparent anesthesia complications

## 2011-06-27 NOTE — Anesthesia Postprocedure Evaluation (Signed)
Anesthesia Post Note  Patient: Chad [name]  Procedure(s) Performed: Procedure(s) (LRB): MULTIPLE EXTRACION WITH ALVEOLOPLASTY (N/A)  Anesthesia type: MAC  Patient location: PACU  Post pain: Pain level controlled  Post assessment: Patient's Cardiovascular Status Stable  Last Vitals:  Filed Vitals:   06/27/11 1318  BP:   Pulse: 82  Temp:   Resp: 23    Post vital signs: Reviewed and stable  Level of consciousness: sedated  Complications: No apparent anesthesia complications

## 2011-06-27 NOTE — Discharge Summary (Signed)
Physician Discharge Summary  Patient ID: Chad [name] MRN: [id number] DOB/AGE: 06-22-1955 56 y.o.  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 y/o male with a PMH of HTN, Hep C+, Gout and renal transplant (1996) admitted on 06/03/2011 with a 3 day history of cough, dyspnea, shortness of breath and fever. ER evaluation demonstrated severe hypoxia and CXR findings consistent with pneumonia.  He underwent emergent bronchoscopy with bloody secretions and pus.  He further decompensated with almost near arrest, requirng intubation and mechanical ventilation.  He had worsened shock thought to be combined septic and cardiogenic (presumed viral cardiomyopathy) requiring vasoactive support until 5/17.  Pulmonary cultures were positive for parainfluenza.  He was treated empirically for HCAP with fungal and atypical coverage.  See antibiotics below.  Infectious disease was consulted to assist with management of antibiotics in the setting of immune suppression.  He had prolonged ICU stay with acute on chronic renal failure, ARDS, shock and mechanical ventilation.  He was extubated and re-intubated subsequently required tracheostomy on 5/20 .  During ICU stay he required CVVHD until 5/26 with progression to intermittent HD as he improved.  He was maintained on home sirolimus and hydrocortisone.  He received IVIG.   ICU stay further complicated by watery stool that was C-Diff negative, lower extremity  cellulitis (DVT negative), atrial fib/flutter and tooth pain.  He underwent tooth extraction on 5/31.  Cardiology followed for atrial arrythmia's.  As he improved, he was evaluated for PMV and diet per speech therapy.  He ultimately was cleared for regular diet and thin liquids.  Physical therapy was involved during hospital course and recommend further rehab efforts and he was transitioned to [facility].     Cultures:  BAL bronch 5/8>>>neg  AFB >> neg  Viral panel >> POS parainfluenza virus  Pneumocystis >> neg  Fungus >> neg  HSV >> neg  BC 5/7>>>neg  Blood X 2 5/12 >> neg   Antibiotics: (per ID)  5/7 levoflox>>>5/10  5/7 bactrim >>>5/10  5/7 micafungin >> 5/14  5/7 vanc >> 5/10  5/10 cefepime >> 5/15  5/15 Ribaviran/IVIG >>Completed   Tests / Events:  5/8 hypoxic resp failure, intubated, near arrest , Doppler legs>>negative for DVT 5/11 worsening shock , on sepsis protocol + dobut for low co-ox  5/12 Echo>>EF 15 to 20%, mild LVH, diffuse hypokinesis, mild MR, mod/severe LA dilation, mod RV systolic dysfx, PAS 52 mmHg  5/15 Family mtg: continue full aggressive support but NCB if arrests. To reassess in 4-5 days. If no progress, consider withdrawal at that time  5/15 Trial of Ribaviran/IVIG  5/16 Markedly improved - reduced pressors, improved cognition, gas exchange improved  5/17 continued improvement. Tolerates PS 5 cm H2O with Vt > 800cc  5/17- extubated, re intubated  5/20- trach  5/23 Doppler legs>>negative for DVT 5/24 Tunneled HD cath placed  5/26 CRRT stopped  5/27 Speech valve fitted.  5/8 diet - regular, thin  5/28 Dental consult called  5/29 downsized to 6 cuffless   Lines / Tubes Lt IJ CVL 05/08>>5/28  Rt Radial aline 05/08>>out  R IJ HD cath 5/12 >> 5/13 (inadvertent removal)  R IJ HD cath 5/13 >>5/24  Tunneled HD cath 5/24>>    Discharge Exam: General - no distress  HEENT - trach site clean. Left upper molar blackened root  Cardiac -  s1s2 regular, no murmur  Chest - scattered rhonchi  Abd - soft, non tender, +bowel sounds  Ext - 2+edema  Neuro - alert, follows commands, moves all extremities   Vital Signs:  Temp: [97.4 F (36.3 C)-101.1 F (38.4 C)] 98.6 F (37 C) (05/31 1440)  Pulse Rate: [74-94] 74 (05/31 1530)  Resp: [17-31] 21 (05/31 1530)  BP: (98-138)/(61-83) 138/72 mmHg (05/31 1440)  SpO2: [94 %-100 %] 100 % (05/31 1530)  FiO2 (%): [28 %-35 %] 28 % (05/31 1530)  Weight: [252 lb 3.3 oz (114.4 kg)] 252 lb 3.3 oz (114.4 kg) (05/30 2358)  I/O last 3 completed shifts:  In: 1160 [P.O.:680; I.V.:300; Other:180]  Out: 1728 [Urine:1325; Other:402; Stool:1]      Discharge Labs  BMET  Lab 06/27/11 0409 06/26/11 0420 06/25/11 0818 06/24/11 0430 06/23/11 0429 06/22/11 0411 06/21/11 0430  NA 131* 129* 130* 137 140 -- --  K 4.4 5.6* -- -- -- -- --  CL 95* 95* 93* 100 104 -- --  CO2 26 19 23 23 24  -- --  GLUCOSE 80 106* 83 112* 95 -- --  BUN 37* 97* 94* 85* 60* -- --  CREATININE 2.86* 4.83* 4.69* 4.11* 2.89* -- --  CALCIUM 8.2* 8.7 8.8 8.7 8.7 -- --  MG -- -- -- -- -- 2.5 2.4  PHOS 4.9* 7.7* 7.5* 6.8* 5.6* -- --     CBC  Lab 06/27/11 0954 06/26/11 0935 06/25/11 0818  HGB 8.0* 8.2* 8.9*  HCT 26.6* 26.4* 29.1*  WBC 7.1 8.8 14.0*  PLT 97* 91* 73*      Current Medications - Final discharge medications to be determined by CIR.  Medication List  As of 06/27/2011  5:25 PM   ASK your doctor about these medications         acetaminophen 500 MG tablet   Commonly known as: TYLENOL      allopurinol 100 MG tablet   Commonly known as: ZYLOPRIM      calcitRIOL 0.5 MCG capsule   Commonly known as: ROCALTROL      diltiazem 240 MG 24 hr capsule   Commonly known as: DILACOR XR      predniSONE 10 MG tablet   Commonly known as: DELTASONE      sildenafil 50 MG tablet   Commonly known as: VIAGRA      simvastatin 40 MG tablet   Commonly known as: ZOCOR      sirolimus 1 MG/ML solution   Commonly  known as: RAPAMUNE      torsemide 20 MG tablet   Commonly known as: DEMADEX             Disposition: 62-Rehab Facility  Discharged Condition: Chad [name] has met maximum benefit of inpatient care and is medically stable and cleared for discharge.  Patient is pending follow up as above.      Time spent on disposition:  Greater than 35 minutes.   Signed: Canary Brim, NP-C [facility] Pgr: [phone]

## 2011-06-27 NOTE — Anesthesia Preprocedure Evaluation (Addendum)
Anesthesia Evaluation    Reviewed: Allergy & Precautions  Airway       Dental   Pulmonary pneumonia , COPD COPD inhaler,  Recent ARDS, VDRF, trach         Cardiovascular hypertension, +CHF  Recent cardiogenic shock, EF - 25%   Neuro/Psych    GI/Hepatic   Endo/Other    Renal/GU ARFRenal disease     Musculoskeletal   Abdominal   Peds  Hematology   Anesthesia Other Findings   Reproductive/Obstetrics                        Anesthesia Physical Anesthesia Plan  ASA: IV  Anesthesia Plan: MAC   Post-op Pain Management:    Induction: Intravenous  Airway Management Planned: Tracheostomy  Additional Equipment:   Intra-op Plan:   Post-operative Plan:   Informed Consent: I have reviewed the patients History and Physical, chart, labs and discussed the procedure including the risks, benefits and alternatives for the proposed anesthesia with the patient or authorized representative who has indicated his/her understanding and acceptance.   Dental advisory given  Plan Discussed with: CRNA, Anesthesiologist and Surgeon  Anesthesia Plan Comments:         Anesthesia Quick Evaluation

## 2011-06-27 NOTE — Op Note (Signed)
Patient:            Chad [name] Date of Birth:  18-Sep-1955 MRN:                [id number]   DATE OF PROCEDURE:  06/27/2011               OPERATIVE REPORT   PREOPERATIVE DIAGNOSES: 1. Acute pulpitis 2. Fractured tooth #14 3. Thrombocytopenia  POSTOPERATIVE DIAGNOSES: 1. Acute pulpitis 2. Fractured tooth #14 3. Thrombocytopenia  OPERATIONS: 1. Extraction of tooth number 14 with alveoloplasty   SURGEON: [name], DDS  ASSISTANT: Zettie Pho, (dental assistant)  ANESTHESIA: Monitored anesthesia care  MEDICATIONS: 1. Ancef 2 g IV prior to invasive dental procedures. 2. Local anesthesia with a total utilization of 2 carpules each containing 34 mg of lidocaine with 0.017 mg of epinephrine.  SPECIMENS: There was one tooth that were discarded.  DRAINS: None  CULTURES: None  COMPLICATIONS: None   ESTIMATED BLOOD LOSS: Less than 25 mLs.  INTRAVENOUS FLUIDS: 200 mLs of Lactated ringers solution.  INDICATIONS: The patient was recently diagnosed with ventilator dependent respiratory failure with deconditioning and is status post tracheostomy.  A dental consultation was then requested to evaluate history of toothache symptoms of upper left molar.  The patient was examined and treatment planned for extraction of tooth #14 with alveoloplasty as indicated.  This treatment plan was formulated to decrease the risks and complications associated with dental infection from affecting the patient's systemic health.  OPERATIVE FINDINGS: Patient was examined operating room number 2.  Tooth #14 was identified for extraction. The patient was noted be affected by a history of acute pulpitis symptoms any fracture tooth #14 with pulpal exposure.   DESCRIPTION OF PROCEDURE: Patient was brought to the main operating room number 2. Patient was then placed in the supine position on the operating table. Monitored anesthesia care Anesthesia was then induced per the anesthesia  team. The patient was then prepped and draped in the usual manner for dental medicine procedure. A timeout was performed. The patient was identified and procedures were verified. The oral cavity was then thoroughly examined with the findings noted above. The patient was then ready for dental medicine procedure as follows:  Local anesthesia was then administered sequentially with a total utilization of 2 carpules each containing 34 mg of lidocaine with 0.017 mg of epinephrine.  The Maxillary left quadrant was first approached. Anesthesia was then delivered utilizing infiltration with lidocaine with epinephrine. A #15 blade incision was then made from the mesial of #16 and extended to the mesial #13.  A  surgical flap was then carefully reflected. Appropriate amounts of buccal and interseptal bone were then removed utilizing a surgical handpiece and bur and copious amounts of sterile saline.  The teeth were then subluxated with a series of straight elevators. Tooth #14 was then sectioned utilizing a surgical handpiece and bur and copious amounts of sterile water. The individual root segments were then elevated appropriately. The buccal and palatal roots were then removed without further complications. Alveoloplasty was then performed utilizing a ronguers and bone file. The surgical site was then irrigated with copious amounts of sterile saline. The tissues were approximated and trimmed appropriately. A piece of Surgifoam was placed the extraction socket. The surgical site was then closed from the mesial numbers 16 and extended to the distal of #13. An interproximal suture was then placed between tooth numbers 12 and 13 utilizing the 3-0 chromic gut suture material. .  At this point time, the entire mouth was irrigated with copious amounts of sterile saline. The patient was exam for complications, seeing none, the dental medicine procedure was deemed to be complete.  A series of 4 x 4 gauze were placed in the  mouth to aid hemostasis. The patient was then handed over to the anesthesia team for final disposition. After an appropriate amount of time, the patient was taken to the postanesthsia care unit with stable vital signs and a good condition. All counts were correct for the dental medicine procedure.   [name], DDS.

## 2011-06-28 ENCOUNTER — Encounter (HOSPITAL_COMMUNITY): Payer: Self-pay | Admitting: *Deleted

## 2011-06-28 DIAGNOSIS — G7281 Critical illness myopathy: Secondary | ICD-10-CM

## 2011-06-28 DIAGNOSIS — Z5189 Encounter for other specified aftercare: Secondary | ICD-10-CM

## 2011-06-28 LAB — RENAL FUNCTION PANEL
CO2: 25 mEq/L (ref 19–32)
Chloride: 95 mEq/L — ABNORMAL LOW (ref 96–112)
Creatinine, Ser: 3.94 mg/dL — ABNORMAL HIGH (ref 0.50–1.35)
GFR calc Af Amer: 18 mL/min — ABNORMAL LOW (ref >90)
GFR calc non Af Amer: 16 mL/min — ABNORMAL LOW (ref >90)
Potassium: 4 mEq/L (ref 3.5–5.1)

## 2011-06-28 MED ORDER — PREDNISONE 10 MG PO TABS
10.0000 mg | ORAL_TABLET | Freq: Every day | ORAL | Status: DC
  Administered 2011-06-28 – 2011-07-16 (×19): 10 mg via ORAL
  Filled 2011-06-28 (×21): qty 1

## 2011-06-28 NOTE — Progress Notes (Signed)
Patient ID: Chad [name], male   DOB: [birth year], 56 y.o.   MRN: [id number] Subjective/Complaints: Chad [name] is an 56 y.o. Male with history of renal transplant, Hep C, admitted 06/03/11 with cough, dyspnea and fever. Patient progressed to VDRF requiring intubation 05/08.  Patient trached 5/20 and tolerating trach collar. He developed fever due to cellulitis LE and IV vancomycin X 7 days recommended by ID. 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 [name] to CFS #6 on 05/29.          Review of Systems  Respiratory: Negative for cough and shortness of breath.   Cardiovascular: Positive for leg swelling.       Chronic dorsum of L foot  Gastrointestinal: Negative.   Genitourinary:       Good output  Neurological: Positive for focal weakness and weakness.       Both legs weak  All other systems reviewed and are negative.    Objective: Vital Signs: Blood pressure 116/68, pulse 83, temperature 98.9 F (37.2 C), temperature source Oral, resp. rate 19, height 6' (1.829 m), weight 111.6 kg (246 lb 0.5 oz), SpO2 95.00%. No results found. Results for orders placed during the hospital encounter of 06/27/11 (from the past 72 hour(s))  RENAL FUNCTION PANEL     Status: Abnormal   Collection Time   06/28/11  6:25 AM      Component Value Range Comment   Sodium 132 (*) 135 - 145 (mEq/L)    Potassium 4.0  3.5 - 5.1 (mEq/L)    Chloride 95 (*) 96 - 112 (mEq/L)    CO2 25  19 - 32 (mEq/L)    Glucose, Bld 93  70 - 99 (mg/dL)    BUN 47 (*) 6 - 23 (mg/dL)    Creatinine, Ser 7.25 (*) 0.50 - 1.35 (mg/dL)    Calcium 8.2 (*) 8.4 - 10.5 (mg/dL)    Phosphorus 4.8 (*) 2.3 - 4.6 (mg/dL)    Albumin 1.8 (*) 3.5 - 5.2 (g/dL)    GFR calc non Af Amer 16 (*) >90 (mL/min)    GFR calc Af Amer 18 (*) >90 (mL/min)     HEENT: normal Cardio: RRR Resp: CTA B/L GI: BS positive Extremity:  Edema dorsum L foot Skin:   Intact Neuro: Alert/Oriented, Normal Sensory  and Abnormal Motor 3-/5 B Hip flexor and Knee extensor Musc/Skel:  Normal   Assessment/Plan: 1. Functional deficits secondary to critical illness myopathy which require 3+ hours per day of interdisciplinary therapy in a comprehensive inpatient rehab setting. Physiatrist is providing close team supervision and 24 hour management of active medical problems listed below. Physiatrist and rehab team continue to assess barriers to discharge/monitor patient progress toward functional and medical goals. FIM:                   Comprehension Comprehension Mode: Auditory Comprehension: 5-Follows basic conversation/direction: With extra time/assistive device  Expression Expression Mode: Verbal Expression: 5-Expresses complex 90% of the time/cues < 10% of the time  Social Interaction Social Interaction: 5-Interacts appropriately 90% of the time - Needs monitoring or encouragement for participation or interaction.  Problem Solving Problem Solving: 4-Solves basic 75 - 89% of the time/requires cueing 10 - 24% of the time  Memory Memory: 4-Recognizes or recalls 75 - 89% of the time/requires cueing 10 - 24% of the time  Medical Problem List and Plan:  1. DVT Prophylaxis/Anticoagulation: Mechanical: Sequential compression devices, below knee Bilateral lower  extremities  2. Pain Management: Continue hydrocodone prn  3. Mood: Monitor for now. Seems very motivated.  4. Renal transplant with chronic allograft nephropathy: continue HD on prn basis per nephrology. Fluid overload improving. Strict I and O. Continue prednisone and sirolimus.  5. Left shin ulcer: Will continue santyl but change foam dressing to wet to dry to help with debridement of yellow eschar. Will get WOC input.  6. Afib: Continue amiodarone. ?coumadin per cardiology.  7. Anemia of chronic disease: Last dose IV iron tomorrow. Continue aranesp weekly.   LOS (Days) 1 A FACE TO FACE EVALUATION WAS PERFORMED  Kainoah Bartosiewicz  E 06/28/2011, 8:43 AM

## 2011-06-28 NOTE — Plan of Care (Signed)
Overall Plan of Care [facility]) Patient Details Name: Chad [name] MRN: [id number] DOB: 05/04/1955  Diagnosis:  Critical Illness Myopathy  Primary Diagnosis:    <principal problem not specified> Co-morbidities: hx of renal trxpnt, AVDRF, morbid obesity  Functional Problem List  Patient demonstrates impairments in the following areas: Balance, Endurance, Motor and Pain  Basic ADL's: grooming, bathing, dressing and toileting Advanced ADL's: simple meal preparation and light housekeeping  Transfers:  bed mobility, bed to chair, car and furniture Locomotion:  ambulation, wheelchair mobility and stairs  Additional Impairments:  Swallowing and Communication  expression  Anticipated Outcomes Item Anticipated Outcome  Eating/Swallowing  Modified Independent  Basic self-care  Mod I  Tolieting  Mod I  Bowel/Bladder  Continent of bowel, manage bladder with equipment  Transfers  Mod I  Locomotion  Mod I  Communication  Independent  Cognition  Independent  Pain  </=2  Safety/Judgment    Other     Therapy Plan:         Team Interventions: Item RN PT OT SLP SW TR Other  Self Care/Advanced ADL Retraining   x      Neuromuscular Re-Education   x      Therapeutic Activities  x x x     UE/LE Strength Training/ROM  x x      UE/LE Coordination Activities  x x      Visual/Perceptual Remediation/Compensation         DME/Adaptive Equipment Instruction  x x      Therapeutic Exercise  x x      Balance/Vestibular Training  x x      Patient/Family Education x x x x     Cognitive Remediation/Compensation         Functional Mobility Training  x x      Ambulation/Gait Training  x       Museum/gallery curator  x       Wheelchair Propulsion/Positioning  x       Functional Child psychotherapist    x     Speech/Language Facilitation    x     Bladder Management x        Bowel Management x        Disease  Management/Prevention         Pain Management x x x      Medication Management x        Skin Care/Wound Management x        Splinting/Orthotics         Discharge Planning  x       Psychosocial Support x                           Team Discharge Planning: Destination:  Home Projected Follow-up:  PT and Home Health Projected Equipment Needs:  Biomedical engineer involved in discharge planning:  Yes  MD ELOS: 10-14 days Medical Rehab Prognosis:  Excellent Assessment: Pt admitted for CIR therapies. He is severely deconditioned and also is dealing with a critical illness myopathy after respiratory and renal failure.  Therapies are addressing self- care, mobility, strength, ROM, activity tolerance, pain, edema control, bowel and bladder function. With mod I goals set.

## 2011-06-28 NOTE — Progress Notes (Signed)
Occupational Therapy Session Note  Patient Details  Name: Chad [name] MRN: [id number] Date of Birth: [birth year]  Today's Date: 06/28/2011 Time: 1478-2956 Time Calculation (min): 29 min  Short Term Goals: Week 1:  OT Short Term Goal 1 (Week 1): Patient will complete bathing, seated, in walk-in shower with min assist OT Short Term Goal 2 (Week 1): Patient will complete transfers bed/toilet/w/c with min assist OT Short Term Goal 3 (Week 1): Patient will demo improved functional endurance as evidenced by ability to complete bathing/dressing/grooming, seated and standing in 60 min. OT Short Term Goal 4 (Week 1): Patient will demo ability to use assistive devices, prn, with min assist  to complete lower body bathing and dressing OT Short Term Goal 5 (Week 1): Patient will demonstrate understanding of 3 prinicples of energy conservation during BADL.  Skilled Therapeutic Interventions: Therapeutic activity with emphasis on UE strengthening using thera-band and theraputty while supine (head elevated) in bed.  Patient admitted to fatigue and depleted endurance from a.m. session due to static sitting in w/c for 1 hour between OT and PT sessions.  OT demo'd use of thera-band, orange, for UE strengthening at shoulders, biceps and triceps.  Patient able to reproduce exercises but requires assist with problem-solving (anchoring band effectively while exercising with each arm).   Literature left with patient, dated for exercise performed.  Therapy Documentation Precautions:  Precautions Precautions: Fall Restrictions Weight Bearing Restrictions: No  Pain: Pain Assessment Pain Assessment: No/denies pain Pain Score: 0-No pain  ADL: ADL Where Assessed-Eating: Wheelchair Grooming: Setup Where Assessed-Grooming: Sitting at sink;Wheelchair Upper Body Bathing: Minimal assistance Where Assessed-Upper Body Bathing: Wheelchair;Sitting at sink Lower Body Bathing: Maximal assistance Where Assessed-Lower  Body Bathing: Sitting at sink;Wheelchair Upper Body Dressing: Not assessed (No clothing, hospital gown) Where Assessed-Upper Body Dressing: Wheelchair Lower Body Dressing: Not assessed (No clothing, hospital gown only) Where Assessed-Lower Body Dressing: Wheelchair;Sitting at sink Toileting: Not assessed  See FIM for current functional status  Therapy/Group: Individual Therapy  Georgeanne Nim 06/28/2011, 4:12 PM

## 2011-06-28 NOTE — Evaluation (Signed)
Occupational Therapy Assessment and Plan  Patient Details  Name: Chad [name] MRN: [id number] Date of Birth: 07-Oct-1955  OT Diagnosis: muscle weakness (generalized) Rehab Potential: Rehab Potential: Good ELOS: 10-14 days   Today's Date: 06/28/2011 Time: 1100-1155 Time Calculation (min): 55 min  Problem List:  Patient Active Problem List  Diagnoses  . HCAP (healthcare-associated pneumonia)  . Thrombocytopenia  . Acute respiratory failure  . ARDS (adult respiratory distress syndrome)  . Renal transplant disorder  . Acute systolic heart failure  . Septic shock  . Acute renal failure  . Cardiomyopathy  . Critical illness myopathy    Past Medical History:  Past Medical History  Diagnosis Date  . Hypertension   . H/O kidney transplant   . Hepatitis   . Chronic kidney disease    Past Surgical History:  Past Surgical History  Procedure Date  . Nephrectomy transplanted organ   . Insertion of dialysis catheter 06/20/2011    Procedure: INSERTION OF DIALYSIS CATHETER;  Surgeon: [name], MD;  Location: [facility] OR;  Service: Vascular;  Laterality: Right;  Ultrasound guided insertion of right internal jugular dialysis catheter    Assessment & Plan Clinical Impression: Patient is a 56 y.o. male with history of renal transplant, Hep C, admitted 06/03/11 with cough, dyspnea and fever. Patient progressed to VDRF requiring intubation 05/08. BAL postive for parainfluenza virus and patient treated with antifungal, IVIG and broad spectrum antibiotics. Patient noted to require pressors due to combination of septic and cardiogenic shock. 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 He was noted to be oliguric with lymphedema and started on CVVHD with improvement. Patient trached 5/20 and tolerating trach collar. He developed fever due to cellulitis LE and IV vancomycin X 7 days recommended by ID.  Downsized to CFS #6 on  05/29. Continues on amiodarone for atrial flutter with ?coumadin for treatment. 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  Patient transferred to CIR on 06/27/2011 .    Patient currently requires max assist with basic self-care skills secondary to muscle weakness and decreased standing balance.  Prior to hospitalization, patient could complete BADL/IADL independently.  Patient will benefit from skilled intervention to increase independence with basic self-care skills and increase level of independence with iADL prior to discharge home independently.  Anticipate patient will require intermittent supervision and follow up home health.  OT - End of Session Activity Tolerance: Tolerates 30+ min activity with multiple rests Endurance Deficit: Yes OT Assessment Rehab Potential: Good Barriers to Discharge: Inaccessible home environment OT Plan OT Frequency: 1-2 X/day, 60-90 minutes Estimated Length of Stay: 10-14 days OT Treatment/Interventions: Balance/vestibular training;DME/adaptive equipment instruction;Functional mobility training;Patient/family education;Pain management;Self Care/advanced ADL retraining;Therapeutic Activities;Therapeutic Exercise;UE/LE Strength taining/ROM;UE/LE Coordination activities;Wheelchair propulsion/positioning OT Recommendation Follow Up Recommendations: Home health OT Equipment Recommended: Rolling walker with 5" wheels  OT Evaluation Precautions/Restrictions  Precautions Precautions: Fall Restrictions Weight Bearing Restrictions: No  General Chart Reviewed: Yes  Vital Signs Therapy Vitals Pulse Rate: 90  Resp: 20  Oxygen Therapy SpO2: 94 % O2 Device: Trach collar FiO2 (%): 28 % O2 Flow Rate (L/min): 5 L/min  Pain Pain Assessment Pain Assessment: No/denies pain Pain Score: 0-No pain  Home Living/Prior Functioning Home Living Lives With: Alone Available Help at Discharge: Friend(s);Family Type of Home: House Home  Access: Stairs to enter [employer] of Steps: 5 Entrance Stairs-Rails: Right;Left Home Layout: Able to live on main level with bedroom/bathroom;Two level Alternate Level  Stairs-Number of Steps: 5 Alternate Level Stairs-Rails: Right;Left (5, rear deck) Bathroom Shower/Tub: Health visitor: Handicapped height Bathroom Accessibility: Yes How Accessible: Accessible via walker Home Adaptive Equipment: Walker - rolling IADL History Homemaking Responsibilities: Yes Meal Prep Responsibility: Primary Laundry Responsibility: Primary Cleaning Responsibility: Primary Bill Paying/Finance Responsibility: Primary Shopping Responsibility: Primary Current License: Yes Mode of Transportation: Car Education: college Occupation: Full time employment 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), home improvement Leisure and Hobbies: Diplomatic Services operational officer, gardening,  Prior Function Level of Independence: Independent with basic ADLs;Independent with gait;Independent with transfers Able to Take Stairs?: Yes Driving: Yes Vocation: Full time employment  ADL ADL Where Assessed-Eating: Wheelchair Grooming: Setup Where Assessed-Grooming: Sitting at sink;Wheelchair Upper Body Bathing: Minimal assistance Where Assessed-Upper Body Bathing: Wheelchair;Sitting at sink Lower Body Bathing: Maximal assistance Where Assessed-Lower Body Bathing: Sitting at sink;Wheelchair Upper Body Dressing: Not assessed (No clothing, hospital gown) Where Assessed-Upper Body Dressing: Wheelchair Lower Body Dressing: Not assessed (No clothing, hospital gown only) Where Assessed-Lower Body Dressing: Wheelchair;Sitting at sink Toileting: Not assessed  Vision/Perception  Vision - History Baseline Vision: Wears glasses only for reading Patient Visual Report: No change from baseline Vision - Assessment Eye Alignment: Within Functional Limits Perception Perception: Within Functional Limits Praxis Praxis: Intact    Cognition Overall Cognitive Status: Appears within functional limits for tasks assessed Orientation Level: Oriented X4 Attention: Alternating Alternating Attention: Appears intact Executive Function: Initiating Reasoning: Appears intact Sequencing: Appears intact Organizing: Impaired Organizing Impairment: Functional complex Decision Making: Impaired Decision Making Impairment: Functional complex Initiating: Impaired Initiating Impairment: Functional complex Self Monitoring: Appears intact Self Correcting: Appears intact  Sensation Sensation Light Touch: Appears Intact Stereognosis: Appears Intact Hot/Cold: Appears Intact Proprioception: Appears Intact Coordination Gross Motor Movements are Fluid and Coordinated: Yes Fine Motor Movements are Fluid and Coordinated: No (mild tremors, bil.)  Motor  Motor Motor: Within Functional Limits Motor - Skilled Clinical Observations: generalized weakness  Mobility  Bed Mobility Bed Mobility: Supine to Sit Supine to Sit: 5: Supervision;With rails Supine to Sit Details: Verbal cues for safe use of DME/AE Sitting - Scoot to Edge of Bed: 5: Supervision Sitting - Scoot to Edge of Bed Details: Verbal cues for safe use of DME/AE Sit to Supine: 5: Supervision Sit to Supine - Details (indicate cue type and reason): Pt used UEs to lift LEs into bed Transfers Sit to Stand: 2: Max assist;From elevated surface;From bed Sit to Stand Details: Manual facilitation for weight bearing Sit to Stand Details (indicate cue type and reason): cues for UE and LE placement, manual facilitation for fwd wt shift and lift Stand to Sit: 4: Min assist;With armrests;Other (comment) (to w/c) Stand to Sit Details (indicate cue type and reason): Verbal cues for precautions/safety;Manual facilitation for placement Stand to Sit Details: cues for UE placement   Trunk/Postural Assessment  Cervical Assessment Cervical Assessment:  (fwd head) Thoracic  Assessment Thoracic Assessment:  (kyphosis, weakness) Lumbar Assessment Lumbar Assessment:  (posterior tilt) Postural Control Postural Control: Within Functional Limits   Balance Static Sitting Balance Static Sitting - Balance Support: Bilateral upper extremity supported;Feet unsupported Static Sitting - Level of Assistance: 5: Stand by assistance Static Standing Balance Static Standing - Level of Assistance: 4: Min assist (with B UE support, pt unable to lift 1 UE)  Extremity/Trunk Assessment RUE Assessment RUE Assessment: Within Functional Limits LUE Assessment LUE Assessment: Within Functional Limits  See FIM for current functional status Refer to Care Plan for Long Term Goals  Recommendations for other services: None  Discharge Criteria: Patient will be discharged  from OT if patient refuses treatment 3 consecutive times without medical reason, if treatment goals not met, if there is a change in medical status, if patient makes no progress towards goals or if patient is discharged from hospital.  The above assessment, treatment plan, treatment alternatives and goals were discussed and mutually agreed upon: by patient  Georgeanne Nim 06/28/2011, 12:48 PM

## 2011-06-28 NOTE — Progress Notes (Signed)
[facility] ROUNDING NOTE   Subjective:   Interval History: none.  Objective:  Vital signs in last 24 hours:  Temp:  [97.4 F (36.3 C)-100 F (37.8 C)] 98.9 F (37.2 C) (06/01 0523) Pulse Rate:  [74-89] 83  (06/01 0523) Resp:  [16-31] 19  (06/01 0523) BP: (113-138)/(63-86) 116/68 mmHg (06/01 0523) SpO2:  [94 %-100 %] 95 % (06/01 0523) FiO2 (%):  [28 %-35 %] 28 % (06/01 0409) Weight:  [111.6 kg (246 lb 0.5 oz)-115.6 kg (254 lb 13.6 oz)] 111.6 kg (246 lb 0.5 oz) (06/01 0523)  Weight change:  Filed Weights   06/27/11 1850 06/28/11 0523  Weight: 115.6 kg (254 lb 13.6 oz) 111.6 kg (246 lb 0.5 oz)    Intake/Output: I/O last 3 completed shifts: In: 240 [P.O.:240] Out: 300 [Urine:300]   Intake/Output this shift:     CVS- RRR RS- CTA trach ABD- BS present soft non-distended EXT- no edema   Basic Metabolic Panel:  Lab 06/28/11 9629 06/27/11 0409 06/26/11 0420 06/25/11 0818 06/24/11 0430 06/22/11 0411  NA 132* 131* 129* 130* 137 --  K 4.0 4.4 5.6* 5.7* 5.0 --  CL 95* 95* 95* 93* 100 --  CO2 25 26 19 23 23  --  GLUCOSE 93 80 106* 83 112* --  BUN 47* 37* 97* 94* 85* --  CREATININE 3.94* 2.86* 4.83* 4.69* 4.11* --  CALCIUM 8.2* 8.2* 8.7 -- -- --  MG -- -- -- -- -- 2.5  PHOS 4.8* 4.9* 7.7* 7.5* 6.8* --    Liver Function Tests:  Lab 06/28/11 5284 06/27/11 0409 06/26/11 0420 06/25/11 0818 06/24/11 0430 06/22/11 0411  AST -- -- -- -- -- 154*  ALT -- -- -- -- -- 61*  ALKPHOS -- -- -- -- -- 259*  BILITOT -- -- -- -- -- 2.2*  PROT -- -- -- -- -- 8.9*  ALBUMIN 1.8* 1.9* 1.9* 2.0* 2.0* --   No results found for this basename: LIPASE:5,AMYLASE:5 in the last 168 hours No results found for this basename: AMMONIA:3 in the last 168 hours  CBC:  Lab 06/27/11 0954 06/26/11 0935 06/25/11 0818 06/24/11 0430 06/23/11 0429  WBC 7.1 8.8 14.0* 17.9* 19.7*  NEUTROABS -- -- -- -- --  HGB 8.0* 8.2* 8.9* 8.5* 8.8*  HCT 26.6* 26.4* 29.1* 28.7* 30.8*  MCV 107.7* 105.2*  107.0* 111.2* 112.8*  PLT 97* 91* 73* 72* 85*    Cardiac Enzymes: No results found for this basename: CKTOTAL:5,CKMB:5,CKMBINDEX:5,TROPONINI:5 in the last 168 hours  BNP: No components found with this basename: POCBNP:5  CBG: No results found for this basename: GLUCAP:5 in the last 168 hours  Microbiology: Results for orders placed during the hospital encounter of 06/03/11  URINE CULTURE     Status: Normal   Collection Time   06/03/11 10:53 PM      Component Value Range Status Comment   Specimen Description URINE, RANDOM   Final    Special Requests ADDED [id number]   Final    Culture  Setup Time [id number]   Final    Colony Count >=100,000 COLONIES/ML   Final    Culture     Final    Value: Multiple bacterial morphotypes present, none predominant. Suggest appropriate recollection if clinically indicated.   Report Status 06/05/2011 FINAL   Final   CULTURE, BLOOD (ROUTINE X 2)     Status: Normal   Collection Time   06/03/11 11:45 PM      Component Value Range Status Comment  Specimen Description BLOOD RIGHT ARM   Final    Special Requests BOTTLES DRAWN AEROBIC AND ANAEROBIC 10CC   Final    Culture  Setup Time [id number]   Final    Culture NO GROWTH 5 DAYS   Final    Report Status 06/10/2011 FINAL   Final   CULTURE, BLOOD (ROUTINE X 2)     Status: Normal   Collection Time   06/03/11 11:50 PM      Component Value Range Status Comment   Specimen Description BLOOD RIGHT ARM   Final    Special Requests     Final    Value: BOTTLES DRAWN AEROBIC AND ANAEROBIC 5CC AEROBOC 3CC ANAEROBIC   Culture  Setup Time [id number]   Final    Culture NO GROWTH 5 DAYS   Final    Report Status 06/10/2011 FINAL   Final   MRSA PCR SCREENING     Status: Normal   Collection Time   06/04/11  3:08 AM      Component Value Range Status Comment   MRSA by PCR NEGATIVE  NEGATIVE  Final   RESPIRATORY VIRUS PANEL (18 COMPONENTS)     Status: Abnormal   Collection Time   06/04/11  6:09 AM      Component  Value Range Status Comment   Source - RVPAN NASAL MUCOSA   Final    Respiratory Syncytial Virus A NOT DETECTED   Final    Respiratory Syncytial Virus B NOT DETECTED   Final    Influenza A NOT DETECTED   Final    Influenza B NOT DETECTED   Final    Parainfluenza 1 NOT DETECTED   Final    Parainfluenza 2 NOT DETECTED   Final    Parainfluenza 3 DETECTED (*)  Final    Parainfluenza 4 NOT DETECTED   Final    Metapneumovirus NOT DETECTED   Final    Coxsackie and Echovirus NOT DETECTED   Final    Rhinovirus NOT DETECTED   Final    Adenovirus B NOT DETECTED   Final    Adenovirus E NOT DETECTED   Final    CoronavirusNL63 NOT DETECTED   Final    CoronavirusHKU1 NOT DETECTED   Final    Coronavirus229E NOT DETECTED   Final    CoronavirusOC43 NOT DETECTED   Final   CULTURE, EXPECTORATED SPUTUM-ASSESSMENT     Status: Normal   Collection Time   06/04/11  7:48 AM      Component Value Range Status Comment   Specimen Description SPUTUM   Final    Special Requests NONE   Final    Sputum evaluation     Final    Value: MICROSCOPIC FINDINGS SUGGEST THAT THIS SPECIMEN IS NOT REPRESENTATIVE OF LOWER RESPIRATORY SECRETIONS. PLEASE RECOLLECT.     CALLED TO D ORTIZ,RN 06/04/11 0911 BY K SCHULTZ   Report Status 06/04/2011 FINAL   Final   AFB CULTURE WITH SMEAR     Status: Normal (Preliminary result)   Collection Time   06/04/11 10:30 AM      Component Value Range Status Comment   Specimen Description BRONCHIAL WASHINGS   Final    Special Requests NONE   Final    ACID FAST SMEAR NO ACID FAST BACILLI SEEN   Final    Culture     Final    Value: CULTURE WILL BE EXAMINED FOR 6 WEEKS BEFORE ISSUING A FINAL REPORT   Report Status PENDING   Incomplete  PNEUMOCYSTIS JIROVECI SMEAR BY DFA     Status: Normal   Collection Time   06/04/11 10:30 AM      Component Value Range Status Comment   Specimen Source-PJSRC BRONCHIAL WASHINGS   Final    Pneumocystis jiroveci Ag NEGATIVE   Final Performed at [facility]  FUNGUS CULTURE [name] SMEAR     Status: Normal (Preliminary result)   Collection Time   06/04/11 10:30 AM      Component Value Range Status Comment   Specimen Description BRONCHIAL WASHINGS   Final    Special Requests NONE   Final    Fungal Smear NO YEAST OR FUNGAL ELEMENTS SEEN   Final    Culture CULTURE IN PROGRESS FOR FOUR WEEKS   Final    Report Status PENDING   Incomplete   CULTURE, RESPIRATORY     Status: Normal   Collection Time   06/04/11 10:30 AM      Component Value Range Status Comment   Specimen Description BRONCHIAL WASHINGS   Final    Special Requests NONE   Final    Gram Stain     Final    Value: FEW WBC PRESENT,BOTH PMN AND MONONUCLEAR     NO SQUAMOUS EPITHELIAL CELLS SEEN     NO ORGANISMS SEEN   Culture NO GROWTH 2 DAYS   Final    Report Status 06/07/2011 FINAL   Final   HSV PCR     Status: Normal   Collection Time   06/04/11 10:30 AM      Component Value Range Status Comment   HSV, PCR Not Detected  Not Detected  Final    HSV 2 , PCR Not Detected  Not Detected  Final    Specimen Source-HSVPCR NO GROWTH   Final   CULTURE, BLOOD (ROUTINE X 2)     Status: Normal   Collection Time   06/04/11  3:15 PM      Component Value Range Status Comment   Specimen Description BLOOD RIGHT HAND   Final    Special Requests BOTTLES DRAWN AEROBIC AND ANAEROBIC 10CC   Final    Culture  Setup Time [id number]   Final    Culture NO GROWTH 5 DAYS   Final    Report Status 06/11/2011 FINAL   Final   CULTURE, BLOOD (ROUTINE X 2)     Status: Normal   Collection Time   06/04/11  3:27 PM      Component Value Range Status Comment   Specimen Description BLOOD LEFT HAND   Final    Special Requests BOTTLES DRAWN AEROBIC ONLY 3CC   Final    Culture  Setup Time [id number]   Final    Culture NO GROWTH 5 DAYS   Final    Report Status 06/11/2011 FINAL   Final   CULTURE, BLOOD (ROUTINE X 2)     Status: Normal   Collection Time   06/08/11  4:15 AM      Component Value Range Status Comment    Specimen Description BLOOD LEFT HAND   Final    Special Requests BOTTLES DRAWN AEROBIC ONLY 10CC   Final    Culture  Setup Time [id number]   Final    Culture NO GROWTH 5 DAYS   Final    Report Status 06/14/2011 FINAL   Final   CULTURE, BLOOD (ROUTINE X 2)     Status: Normal   Collection Time   06/08/11  4:20 AM  Component Value Range Status Comment   Specimen Description BLOOD LEFT HAND   Final    Special Requests BOTTLES DRAWN AEROBIC ONLY 10CC   Final    Culture  Setup Time [id number]   Final    Culture NO GROWTH 5 DAYS   Final    Report Status 06/14/2011 FINAL   Final   RESPIRATORY VIRUS PANEL (18 COMPONENTS)     Status: Abnormal   Collection Time   06/11/11  8:33 PM      Component Value Range Status Comment   Source - RVPAN BRONCHIAL ALVEOLAR LAVAGE   Final    Respiratory Syncytial Virus A NOT DETECTED   Final    Respiratory Syncytial Virus B NOT DETECTED   Final    Influenza A NOT DETECTED   Final    Influenza B NOT DETECTED   Final    Parainfluenza 1 NOT DETECTED   Final    Parainfluenza 2 NOT DETECTED   Final    Parainfluenza 3 DETECTED (*)  Final    Parainfluenza 4 NOT DETECTED   Final    Metapneumovirus NOT DETECTED   Final    Coxsackie and Echovirus NOT DETECTED   Final    Rhinovirus NOT DETECTED   Final    Adenovirus B NOT DETECTED   Final    Adenovirus E NOT DETECTED   Final    CoronavirusNL63 NOT DETECTED   Final    CoronavirusHKU1 NOT DETECTED   Final    Coronavirus229E NOT DETECTED   Final    CoronavirusOC43 NOT DETECTED   Final   CLOSTRIDIUM DIFFICILE BY PCR     Status: Normal   Collection Time   06/13/11  1:14 PM      Component Value Range Status Comment   C difficile by pcr NEGATIVE  NEGATIVE  Final   CLOSTRIDIUM DIFFICILE BY PCR     Status: Normal   Collection Time   06/19/11 10:25 AM      Component Value Range Status Comment   C difficile by pcr NEGATIVE  NEGATIVE  Final   CULTURE, BLOOD (ROUTINE X 2)     Status: Normal   Collection Time    06/19/11 10:57 AM      Component Value Range Status Comment   Specimen Description BLOOD LEFT ARM   Final    Special Requests BOTTLES DRAWN AEROBIC AND ANAEROBIC 10CC   Final    Culture  Setup Time [id number]   Final    Culture NO GROWTH 5 DAYS   Final    Report Status 06/25/2011 FINAL   Final   CULTURE, BLOOD (ROUTINE X 2)     Status: Normal   Collection Time   06/19/11 10:58 AM      Component Value Range Status Comment   Specimen Description BLOOD LEFT HAND   Final    Special Requests BOTTLES DRAWN AEROBIC AND ANAEROBIC 10CC   Final    Culture  Setup Time [id number]   Final    Culture NO GROWTH 5 DAYS   Final    Report Status 06/25/2011 FINAL   Final   SURGICAL PCR SCREEN     Status: Abnormal   Collection Time   06/27/11  7:47 AM      Component Value Range Status Comment   MRSA, PCR NEGATIVE  NEGATIVE  Final    Staphylococcus aureus POSITIVE (*) NEGATIVE  Final     Coagulation Studies: No results found for this basename: LABPROT:5,INR:5 in the last  72 hours  Urinalysis: No results found for this basename: COLORURINE:2,APPERANCEUR:2,LABSPEC:2,PHURINE:2,GLUCOSEU:2,HGBUR:2,BILIRUBINUR:2,KETONESUR:2,PROTEINUR:2,UROBILINOGEN:2,NITRITE:2,LEUKOCYTESUR:2 in the last 72 hours    Imaging: No results found.   Medications:        . amiodarone  200 mg Oral Daily  . antiseptic oral rinse  15 mL Mouth Rinse QID  . chlorhexidine  15 mL Mouth Rinse BID  . collagenase   Topical Daily  . darbepoetin (ARANESP) injection - DIALYSIS  100 mcg Intravenous Q Thu-HD  . ferric gluconate (FERRLECIT/NULECIT) IV  125 mg Intravenous Q24 Hr x 2  . pantoprazole  40 mg Oral BID AC  . predniSONE  10 mg Oral Q breakfast  . sirolimus  0.75 mg Oral Daily  . DISCONTD: amiodarone  200 mg Oral Daily  . DISCONTD: amiodarone  200 mg Oral Daily  . DISCONTD: collagenase   Topical Daily  . DISCONTD: darbepoetin (ARANESP) injection - DIALYSIS  100 mcg Intravenous Q Thu-HD  . DISCONTD: ferric gluconate  (FERRLECIT/NULECIT) IV  125 mg Intravenous Daily  . DISCONTD: pantoprazole  40 mg Oral BID  . DISCONTD: prednisoLONE  10 mg Oral Daily  . DISCONTD: predniSONE  10 mg Per Tube Q breakfast  . DISCONTD: sirolimus  1 mg Oral Daily   acetaminophen, albuterol, aluminum hydroxide, bisacodyl, diphenhydrAMINE, feeding supplement (NEPRO CARB STEADY), guaiFENesin-dextromethorphan, HYDROcodone-acetaminophen, methocarbamol, ondansetron (ZOFRAN) IV, ondansetron, polyethylene glycol, sodium phosphate, traZODone, DISCONTD: acetaminophen, DISCONTD: aluminum hydroxide, DISCONTD: bisacodyl, DISCONTD: diphenhydrAMINE, DISCONTD: guaiFENesin-dextromethorphan, DISCONTD: ondansetron (ZOFRAN) IV DISCONTD: ondansetron, DISCONTD: polyethylene glycol, DISCONTD: sodium phosphate, DISCONTD: traZODone  Assessment/ Plan:  is a 56 y.o. yo male s/p renal transplant who was admitted on 06/03/2011 with PNA pulm HTN, volume overload baseline Creatinine 2 s 1. PNA/parainfluenza/cardiomyopathy/hypotension- completed RX for paraflu. Has trach and requiring vent support. improved 2. AKI, oliguric, HD dependent with CRRT 5/13-No pulm edema. Do intermittent HD as needed. Will need dialysis today 3. 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 He is oliguric. Blood sirolimus level on 5/8 at 5 pm was 2.0  4. Anemia- hgb stable. Aranesp. Iron. 5. Secondary hyperparathyroidism- no treatment right now, rocaltrol stopped - not essential at this time and calcium up 6. PNA parainfluenza- finished course . Urine output great. Will watch for recovery and dialyze PRN    LOS: 1 Chad [name] @TODAY @10 :45 AM

## 2011-06-28 NOTE — Evaluation (Signed)
Physical Therapy Assessment and Plan  Patient Details  Name: LAURIS SERVISS MRN: [id number] Date of Birth: Jul 13, 1955  PT Diagnosis: Difficulty walking and Muscle weakness Rehab Potential: Good ELOS: 3-4 weeks   Today's Date: 06/28/2011 Time: 1100-1155 Time Calculation (min): 55 min  Problem List:  Patient Active Problem List  Diagnoses  . HCAP (healthcare-associated pneumonia)  . Thrombocytopenia  . Acute respiratory failure  . ARDS (adult respiratory distress syndrome)  . Renal transplant disorder  . Acute systolic heart failure  . Septic shock  . Acute renal failure  . Cardiomyopathy  . Critical illness myopathy    Past Medical History:  Past Medical History  Diagnosis Date  . Hypertension   . H/O kidney transplant   . Hepatitis   . Chronic kidney disease    Past Surgical History:  Past Surgical History  Procedure Date  . Nephrectomy transplanted organ   . Insertion of dialysis catheter 06/20/2011    Procedure: INSERTION OF DIALYSIS CATHETER;  Surgeon: [name], MD;  Location: [facility] OR;  Service: Vascular;  Laterality: Right;  Ultrasound guided insertion of right internal jugular dialysis catheter    Assessment & Plan Clinical Impression: Patient is a 56 y.o. year old male with recent admission to the hospital on 06/03/11 with cough, dyspnea and fever. Patient progressed to VDRF requiring intubation 05/08. BAL postive for parainfluenza virus and patient treated with antifungal, IVIG and broad spectrum antibiotics. Patient noted to require pressors due to combination of septic and cardiogenic shock. 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 He was noted to be oliguric with lymphedema and started on CVVHD with improvement. Patient trached 5/20 and tolerating trach collar. He developed fever due to cellulitis LE and IV vancomycin X 7 days recommended by ID. Urine output improving and being  dialyzed on prn basis for chronic allograft nephropathy.  Patient transferred to CIR on 06/27/2011 .   Patient currently requires total with mobility secondary to muscle weakness and decreased cardiorespiratoy endurance.  Prior to hospitalization, patient was independent with mobility and lived with Alone in a House home.  Home access is 5Stairs to enter.  Patient will benefit from skilled PT intervention to maximize safe functional mobility, minimize fall risk and decrease caregiver burden for planned discharge home with intermittent assist.  Anticipate patient will benefit from follow up [facility] at discharge.  PT - End of Session Activity Tolerance: Tolerates 30+ min activity with multiple rests Endurance Deficit: Yes PT Assessment Rehab Potential: Good PT Plan PT Frequency: 1-2 X/day, 60-90 minutes Estimated Length of Stay: 3-4 weeks PT Treatment/Interventions: Ambulation/gait training;Balance/vestibular training;Discharge planning;DME/adaptive equipment instruction;Community reintegration;Functional mobility training;Therapeutic Activities;Patient/family education;Stair training;UE/LE Coordination activities;Wheelchair propulsion/positioning;UE/LE Strength taining/ROM;Neuromuscular re-education;Therapeutic Exercise PT Recommendation Follow Up Recommendations: Home health PT  PT Evaluation Precautions/Restrictions Precautions Precautions: Fall Restrictions Weight Bearing Restrictions: No  Pain Pain Assessment Pain Assessment: No/denies pain Pain Score: 0-No pain Home Living/Prior Functioning Home Living Lives With: Alone Available Help at Discharge: Friend(s);Family Type of Home: House Home Access: Stairs to enter [employer] of Steps: 5 Entrance Stairs-Rails: Right;Left Home Layout: Able to live on main level with bedroom/bathroom;Two level Alternate Level Stairs-Number of Steps: 5 Alternate Level Stairs-Rails: Right;Left (5, rear deck) Bathroom Shower/Tub: Architectural technologist: Handicapped height Bathroom Accessibility: Yes How Accessible: Accessible via walker Home Adaptive Equipment: Walker - rolling Prior Function Level of Independence: Independent with basic ADLs;Independent with gait;Independent with transfers Able to Take Stairs?: Yes  Driving: Yes Vocation: Full time employment  Cognition Overall Cognitive Status: Appears within functional limits for tasks assessed Orientation Level: Oriented X4 Sensation Sensation Light Touch: Appears Intact Proprioception: Appears Intact Coordination Gross Motor Movements are Fluid and Coordinated: Yes  Motor  Motor Motor: Within Functional Limits Motor - Skilled Clinical Observations: generalized weakness  Mobility Bed Mobility Bed Mobility: Supine to Sit Supine to Sit: 5: Supervision;With rails Supine to Sit Details: Verbal cues for safe use of DME/AE Sitting - Scoot to Edge of Bed: 5: Supervision Sitting - Scoot to Edge of Bed Details: Verbal cues for safe use of DME/AE Sit to Supine: 5: Supervision Sit to Supine - Details (indicate cue type and reason): Pt used UEs to lift LEs into bed Transfers Sit to Stand: 2: Max assist;From elevated surface;From bed Sit to Stand Details: Manual facilitation for weight bearing Sit to Stand Details (indicate cue type and reason): cues for UE and LE placement, manual facilitation for fwd wt shift and lift Stand to Sit: 4: Min assist;With armrests;Other (comment) (to w/c) Stand to Sit Details (indicate cue type and reason): Verbal cues for precautions/safety;Manual facilitation for placement Stand to Sit Details: cues for UE placement Stand Pivot Transfers: 2: Max assist;From elevated surface Stand Pivot Transfer Details (indicate cue type and reason): with RW Transfer via Lift Equipment: Maximove Locomotion  Ambulation Ambulation: Yes Ambulation/Gait Assistance: 4: Min Environmental consultant (Feet): 10 Feet Assistive device: Rolling  walker Ambulation/Gait Assistance Details: small step and stride length, decreased cadence, heavy reliance on UEs on RW, manual facilitation at hips for wt shifting Stairs / Additional Locomotion Stairs: No (unable to attempt due to pt fatigue) Wheelchair Mobility Wheelchair Mobility: Yes Wheelchair Assistance: 2: Max Education officer, museum: Both lower extermities Wheelchair Parts Management: Needs assistance Distance: 20'  Trunk/Postural Assessment  Cervical Assessment Cervical Assessment:  (fwd head) Thoracic Assessment Thoracic Assessment:  (kyphosis, weakness) Lumbar Assessment Lumbar Assessment:  (posterior tilt) Postural Control Postural Control: Within Functional Limits  Balance Static Sitting Balance Static Sitting - Balance Support: Bilateral upper extremity supported;Feet unsupported Static Sitting - Level of Assistance: 5: Stand by assistance Static Standing Balance Static Standing - Level of Assistance: 4: Min assist (with B UE support, pt unable to lift 1 UE) Extremity Assessment  RLE Assessment RLE Assessment:  (grossly 3-/5, ROM limited by edema, weakness) LLE Assessment LLE Assessment:  (grossly 2+/5, ROM limited by edema and weakness)  See FIM for current functional status Refer to Care Plan for Long Term Goals  Recommendations for other services: None  Discharge Criteria: Patient will be discharged from PT if patient refuses treatment 3 consecutive times without medical reason, if treatment goals not met, if there is a change in medical status, if patient makes no progress towards goals or if patient is discharged from hospital.  The above assessment, treatment plan, treatment alternatives and goals were discussed and mutually agreed upon: by patient  Treatment initiated during session: Pt initially sitting in low w/c, attempted standing pulling on grab bar x 3 with +2 assist, pt unable due to fatigue, required maxi move for transfer w/c to bed.  From  bed in elevated position pt able to sit to stand, SPT and gait with RW.  Standing tolerance 2 x 60 seconds for toileting, hygiene.  Pt unable to lift 1 UE off of RW to assist with hygiene.  Scooting edge of bed with focus on wt shift fwd and wt through LEs, multiple attempts with supervision.  Supine TE for LE strength  and endurance heel slides, AP and glut squeezes.  Pt very fatigued during session, required frequent rests.  Makaleigh Reinard 06/28/2011, 12:44 PM
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
## 2011-06-29 LAB — RENAL FUNCTION PANEL
Albumin: 1.9 g/dL — ABNORMAL LOW (ref 3.5–5.2)
Calcium: 8.3 mg/dL — ABNORMAL LOW (ref 8.4–10.5)
GFR calc Af Amer: 17 mL/min — ABNORMAL LOW (ref >90)
Glucose, Bld: 86 mg/dL (ref 70–99)
Phosphorus: 4.6 mg/dL (ref 2.3–4.6)
Potassium: 4.2 mEq/L (ref 3.5–5.1)
Sodium: 134 mEq/L — ABNORMAL LOW (ref 135–145)

## 2011-06-29 LAB — HEPATITIS B CORE ANTIBODY, IGM: Hep B C IgM: NEGATIVE

## 2011-06-29 NOTE — Progress Notes (Signed)
Patient ID: Chad [name], male   DOB: [birth year], 56 y.o.   MRN: [id number] Subjective/Complaints: Chad [name] is an 56 y.o. Male with history of renal transplant, Hep C, admitted 06/03/11 with cough, dyspnea and fever. Patient progressed to VDRF requiring intubation 05/08.  Patient trached 5/20 and tolerating trach collar. He developed fever due to cellulitis LE and IV vancomycin X 7 days recommended by ID. 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 [name] to CFS #6 on 05/29.    Resp therapist notes a good cough, may be ready to downsize soon.        Review of Systems  Respiratory: Negative for cough and shortness of breath.   Cardiovascular: Positive for leg swelling.       Chronic dorsum of L foot  Gastrointestinal: Negative.   Genitourinary:       Good output  Neurological: Positive for focal weakness and weakness.       Both legs weak  All other systems reviewed and are negative.    Objective: Vital Signs: Blood pressure 106/69, pulse 79, temperature 98.3 F (36.8 C), temperature source Oral, resp. rate 17, height 6' (1.829 m), weight 113.626 kg (250 lb 8 oz), SpO2 93.00%. No results found. Results for orders placed during the hospital encounter of 06/27/11 (from the past 72 hour(s))  RENAL FUNCTION PANEL     Status: Abnormal   Collection Time   06/28/11  6:25 AM      Component Value Range Comment   Sodium 132 (*) 135 - 145 (mEq/L)    Potassium 4.0  3.5 - 5.1 (mEq/L)    Chloride 95 (*) 96 - 112 (mEq/L)    CO2 25  19 - 32 (mEq/L)    Glucose, Bld 93  70 - 99 (mg/dL)    BUN 47 (*) 6 - 23 (mg/dL)    Creatinine, Ser 4.09 (*) 0.50 - 1.35 (mg/dL)    Calcium 8.2 (*) 8.4 - 10.5 (mg/dL)    Phosphorus 4.8 (*) 2.3 - 4.6 (mg/dL)    Albumin 1.8 (*) 3.5 - 5.2 (g/dL)    GFR calc non Af Amer 16 (*) >90 (mL/min)    GFR calc Af Amer 18 (*) >90 (mL/min)   HEPATITIS B CORE ANTIBODY, IGM     Status: Normal   Collection Time   06/28/11  11:45 AM      Component Value Range Comment   Hep B C IgM NEGATIVE  NEGATIVE    RENAL FUNCTION PANEL     Status: Abnormal   Collection Time   06/29/11  5:00 AM      Component Value Range Comment   Sodium 134 (*) 135 - 145 (mEq/L)    Potassium 4.2  3.5 - 5.1 (mEq/L)    Chloride 98  96 - 112 (mEq/L)    CO2 25  19 - 32 (mEq/L)    Glucose, Bld 86  70 - 99 (mg/dL)    BUN 53 (*) 6 - 23 (mg/dL)    Creatinine, Ser 8.11 (*) 0.50 - 1.35 (mg/dL)    Calcium 8.3 (*) 8.4 - 10.5 (mg/dL)    Phosphorus 4.6  2.3 - 4.6 (mg/dL)    Albumin 1.9 (*) 3.5 - 5.2 (g/dL)    GFR calc non Af Amer 15 (*) >90 (mL/min)    GFR calc Af Amer 17 (*) >90 (mL/min)     HEENT: normal Cardio: RRR Resp: CTA B/L GI: BS positive Extremity:  Edema  dorsum L foot Skin:   Intact Neuro: Alert/Oriented, Normal Sensory and Abnormal Motor 3-/5 B Hip flexor and Knee extensor Musc/Skel:  Normal   Assessment/Plan: 1. Functional deficits secondary to critical illness myopathy which require 3+ hours per day of interdisciplinary therapy in a comprehensive inpatient rehab setting. Physiatrist is providing close team supervision and 24 hour management of active medical problems listed below. Physiatrist and rehab team continue to assess barriers to discharge/monitor patient progress toward functional and medical goals. FIM: FIM - Bathing Bathing Steps Patient Completed: Chest;Right Arm;Left Arm;Abdomen;Front perineal area;Right upper leg;Left upper leg Bathing: 3: Mod-Patient completes 5-7 31f 10 parts or 50-74%  FIM - Upper Body Dressing/Undressing Upper body dressing/undressing: 0: Wears gown/pajamas-no public clothing FIM - Lower Body Dressing/Undressing Lower body dressing/undressing: 0: Wears Oceanographer        FIM - Banker Devices: Bed rails;Arm rests;HOB elevated Bed/Chair Transfer: 1: Mechanical lift;5: Sit > Supine: Supervision (verbal cues/safety  issues)  FIM - Locomotion: Wheelchair Distance: 20' Locomotion: Wheelchair: 1: Travels less than 50 ft with maximal assistance (Pt: 25 - 49%) FIM - Locomotion: Ambulation Ambulation/Gait Assistance: 4: Min assist Locomotion: Ambulation: 1: Travels less than 50 ft with minimal assistance (Pt.>75%)  Comprehension Comprehension Mode: Auditory Comprehension: 7-Follows complex conversation/direction: With no assist  Expression Expression Mode: Verbal Expression Assistive Devices: 6-Talk trach valve Expression: 6-Expresses complex ideas: With extra time/assistive device  Social Interaction Social Interaction: 7-Interacts appropriately with others - No medications needed.  Problem Solving Problem Solving: 7-Solves complex problems: Recognizes & self-corrects  Memory Memory: 7-Complete Independence: No helper  Medical Problem List and Plan:  1. DVT Prophylaxis/Anticoagulation: Mechanical: Sequential compression devices, below knee Bilateral lower extremities  2. Pain Management: Continue hydrocodone prn  3. Mood: Monitor for now. Seems very motivated.  4. Renal transplant with chronic allograft nephropathy: continue HD on prn basis per nephrology. Fluid overload improving. Strict I and O. Continue prednisone and sirolimus.  5. Left shin ulcer: Will continue santyl but change foam dressing to wet to dry to help with debridement of yellow eschar. Will get WOC input.  6. Afib: Continue amiodarone. ?coumadin per cardiology.  7. Anemia of chronic disease: Last dose IV iron tomorrow. Continue aranesp weekly. 8. Pulmonary toilet- may be ready to downsize trach to #4  LOS (Days) 2 A FACE TO FACE EVALUATION WAS PERFORMED  Ponce Skillman E 06/29/2011, 7:53 AM

## 2011-06-29 NOTE — Progress Notes (Signed)
[facility] ROUNDING NOTE   Subjective:   Interval History: none.  Objective:  Vital signs in last 24 hours:  Temp:  [98.3 F (36.8 C)-99.2 F (37.3 C)] 98.3 F (36.8 C) (06/02 0601) Pulse Rate:  [75-90] 79  (06/02 0601) Resp:  [14-20] 17  (06/02 0601) BP: (106-122)/(69) 106/69 mmHg (06/02 0601) SpO2:  [93 %-96 %] 93 % (06/02 0601) FiO2 (%):  [21 %-28 %] 28 % (06/02 0419) Weight:  [113.626 kg (250 lb 8 oz)] 113.626 kg (250 lb 8 oz) (06/02 0601)  Weight change: -1.974 kg (-4 lb 5.6 oz) Filed Weights   06/27/11 1850 06/28/11 0523 06/29/11 0601  Weight: 115.6 kg (254 lb 13.6 oz) 111.6 kg (246 lb 0.5 oz) 113.626 kg (250 lb 8 oz)    Intake/Output: I/O last 3 completed shifts: In: 1200 [P.O.:1200] Out: 1101 [Urine:1100; Stool:1]   Intake/Output this shift:     CVS- RRR RS- CTA ABD- BS present soft non-distended EXT- no edema   Basic Metabolic Panel:  Lab 06/29/11 4098 06/28/11 0625 06/27/11 0409 06/26/11 0420 06/25/11 0818  NA 134* 132* 131* 129* 130*  K 4.2 4.0 4.4 5.6* 5.7*  CL 98 95* 95* 95* 93*  CO2 25 25 26 19 23   GLUCOSE 86 93 80 106* 83  BUN 53* 47* 37* 97* 94*  CREATININE 4.16* 3.94* 2.86* 4.83* 4.69*  CALCIUM 8.3* 8.2* 8.2* -- --  MG -- -- -- -- --  PHOS 4.6 4.8* 4.9* 7.7* 7.5*    Liver Function Tests:  Lab 06/29/11 0500 06/28/11 0625 06/27/11 0409 06/26/11 0420 06/25/11 0818  AST -- -- -- -- --  ALT -- -- -- -- --  ALKPHOS -- -- -- -- --  BILITOT -- -- -- -- --  PROT -- -- -- -- --  ALBUMIN 1.9* 1.8* 1.9* 1.9* 2.0*   No results found for this basename: LIPASE:5,AMYLASE:5 in the last 168 hours No results found for this basename: AMMONIA:3 in the last 168 hours  CBC:  Lab 06/27/11 0954 06/26/11 0935 06/25/11 0818 06/24/11 0430 06/23/11 0429  WBC 7.1 8.8 14.0* 17.9* 19.7*  NEUTROABS -- -- -- -- --  HGB 8.0* 8.2* 8.9* 8.5* 8.8*  HCT 26.6* 26.4* 29.1* 28.7* 30.8*  MCV 107.7* 105.2* 107.0* 111.2* 112.8*  PLT 97* 91* 73* 72* 85*      Cardiac Enzymes: No results found for this basename: CKTOTAL:5,CKMB:5,CKMBINDEX:5,TROPONINI:5 in the last 168 hours  BNP: No components found with this basename: POCBNP:5  CBG: No results found for this basename: GLUCAP:5 in the last 168 hours  Microbiology: Results for orders placed during the hospital encounter of 06/03/11  URINE CULTURE     Status: Normal   Collection Time   06/03/11 10:53 PM      Component Value Range Status Comment   Specimen Description URINE, RANDOM   Final    Special Requests ADDED [id number]   Final    Culture  Setup Time [id number]   Final    Colony Count >=100,000 COLONIES/ML   Final    Culture     Final    Value: Multiple bacterial morphotypes present, none predominant. Suggest appropriate recollection if clinically indicated.   Report Status 06/05/2011 FINAL   Final   CULTURE, BLOOD (ROUTINE X 2)     Status: Normal   Collection Time   06/03/11 11:45 PM      Component Value Range Status Comment   Specimen Description BLOOD RIGHT ARM   Final  Special Requests BOTTLES DRAWN AEROBIC AND ANAEROBIC 10CC   Final    Culture  Setup Time [id number]   Final    Culture NO GROWTH 5 DAYS   Final    Report Status 06/10/2011 FINAL   Final   CULTURE, BLOOD (ROUTINE X 2)     Status: Normal   Collection Time   06/03/11 11:50 PM      Component Value Range Status Comment   Specimen Description BLOOD RIGHT ARM   Final    Special Requests     Final    Value: BOTTLES DRAWN AEROBIC AND ANAEROBIC 5CC AEROBOC 3CC ANAEROBIC   Culture  Setup Time [id number]   Final    Culture NO GROWTH 5 DAYS   Final    Report Status 06/10/2011 FINAL   Final   MRSA PCR SCREENING     Status: Normal   Collection Time   06/04/11  3:08 AM      Component Value Range Status Comment   MRSA by PCR NEGATIVE  NEGATIVE  Final   RESPIRATORY VIRUS PANEL (18 COMPONENTS)     Status: Abnormal   Collection Time   06/04/11  6:09 AM      Component Value Range Status Comment   Source - RVPAN NASAL  MUCOSA   Final    Respiratory Syncytial Virus A NOT DETECTED   Final    Respiratory Syncytial Virus B NOT DETECTED   Final    Influenza A NOT DETECTED   Final    Influenza B NOT DETECTED   Final    Parainfluenza 1 NOT DETECTED   Final    Parainfluenza 2 NOT DETECTED   Final    Parainfluenza 3 DETECTED (*)  Final    Parainfluenza 4 NOT DETECTED   Final    Metapneumovirus NOT DETECTED   Final    Coxsackie and Echovirus NOT DETECTED   Final    Rhinovirus NOT DETECTED   Final    Adenovirus B NOT DETECTED   Final    Adenovirus E NOT DETECTED   Final    CoronavirusNL63 NOT DETECTED   Final    CoronavirusHKU1 NOT DETECTED   Final    Coronavirus229E NOT DETECTED   Final    CoronavirusOC43 NOT DETECTED   Final   CULTURE, EXPECTORATED SPUTUM-ASSESSMENT     Status: Normal   Collection Time   06/04/11  7:48 AM      Component Value Range Status Comment   Specimen Description SPUTUM   Final    Special Requests NONE   Final    Sputum evaluation     Final    Value: MICROSCOPIC FINDINGS SUGGEST THAT THIS SPECIMEN IS NOT REPRESENTATIVE OF LOWER RESPIRATORY SECRETIONS. PLEASE RECOLLECT.     CALLED TO D ORTIZ,RN 06/04/11 0911 BY K SCHULTZ   Report Status 06/04/2011 FINAL   Final   AFB CULTURE WITH SMEAR     Status: Normal (Preliminary result)   Collection Time   06/04/11 10:30 AM      Component Value Range Status Comment   Specimen Description BRONCHIAL WASHINGS   Final    Special Requests NONE   Final    ACID FAST SMEAR NO ACID FAST BACILLI SEEN   Final    Culture     Final    Value: CULTURE WILL BE EXAMINED FOR 6 WEEKS BEFORE ISSUING A FINAL REPORT   Report Status PENDING   Incomplete   PNEUMOCYSTIS JIROVECI SMEAR BY DFA  Status: Normal   Collection Time   06/04/11 10:30 AM      Component Value Range Status Comment   Specimen Source-PJ[facility] BRONCHIAL WASHINGS   Final    Pneumocystis jiroveci Ag NEGATIVE   Final Performed at [facility]  FUNGUS CULTURE W SMEAR     Status: Normal  (Preliminary result)   Collection Time   06/04/11 10:30 AM      Component Value Range Status Comment   Specimen Description BRONCHIAL WASHINGS   Final    Special Requests NONE   Final    Fungal Smear NO YEAST OR FUNGAL ELEMENTS SEEN   Final    Culture CULTURE IN PROGRESS FOR FOUR WEEKS   Final    Report Status PENDING   Incomplete   CULTURE, RESPIRATORY     Status: Normal   Collection Time   06/04/11 10:30 AM      Component Value Range Status Comment   Specimen Description BRONCHIAL WASHINGS   Final    Special Requests NONE   Final    Gram Stain     Final    Value: FEW WBC PRESENT,BOTH PMN AND MONONUCLEAR     NO SQUAMOUS EPITHELIAL CELLS SEEN     NO ORGANISMS SEEN   Culture NO GROWTH 2 DAYS   Final    Report Status 06/07/2011 FINAL   Final   HSV PCR     Status: Normal   Collection Time   06/04/11 10:30 AM      Component Value Range Status Comment   HSV, PCR Not Detected  Not Detected  Final    HSV 2 , PCR Not Detected  Not Detected  Final    Specimen Source-HSVPCR NO GROWTH   Final   CULTURE, BLOOD (ROUTINE X 2)     Status: Normal   Collection Time   06/04/11  3:15 PM      Component Value Range Status Comment   Specimen Description BLOOD RIGHT HAND   Final    Special Requests BOTTLES DRAWN AEROBIC AND ANAEROBIC 10CC   Final    Culture  Setup Time [id number]   Final    Culture NO GROWTH 5 DAYS   Final    Report Status 06/11/2011 FINAL   Final   CULTURE, BLOOD (ROUTINE X 2)     Status: Normal   Collection Time   06/04/11  3:27 PM      Component Value Range Status Comment   Specimen Description BLOOD LEFT HAND   Final    Special Requests BOTTLES DRAWN AEROBIC ONLY 3CC   Final    Culture  Setup Time [id number]   Final    Culture NO GROWTH 5 DAYS   Final    Report Status 06/11/2011 FINAL   Final   CULTURE, BLOOD (ROUTINE X 2)     Status: Normal   Collection Time   06/08/11  4:15 AM      Component Value Range Status Comment   Specimen Description BLOOD LEFT HAND   Final     Special Requests BOTTLES DRAWN AEROBIC ONLY 10CC   Final    Culture  Setup Time [id number]   Final    Culture NO GROWTH 5 DAYS   Final    Report Status 06/14/2011 FINAL   Final   CULTURE, BLOOD (ROUTINE X 2)     Status: Normal   Collection Time   06/08/11  4:20 AM      Component Value Range Status  Comment   Specimen Description BLOOD LEFT HAND   Final    Special Requests BOTTLES DRAWN AEROBIC ONLY 10CC   Final    Culture  Setup Time [id number]   Final    Culture NO GROWTH 5 DAYS   Final    Report Status 06/14/2011 FINAL   Final   RESPIRATORY VIRUS PANEL (18 COMPONENTS)     Status: Abnormal   Collection Time   06/11/11  8:33 PM      Component Value Range Status Comment   Source - RVPAN BRONCHIAL ALVEOLAR LAVAGE   Final    Respiratory Syncytial Virus A NOT DETECTED   Final    Respiratory Syncytial Virus B NOT DETECTED   Final    Influenza A NOT DETECTED   Final    Influenza B NOT DETECTED   Final    Parainfluenza 1 NOT DETECTED   Final    Parainfluenza 2 NOT DETECTED   Final    Parainfluenza 3 DETECTED (*)  Final    Parainfluenza 4 NOT DETECTED   Final    Metapneumovirus NOT DETECTED   Final    Coxsackie and Echovirus NOT DETECTED   Final    Rhinovirus NOT DETECTED   Final    Adenovirus B NOT DETECTED   Final    Adenovirus E NOT DETECTED   Final    CoronavirusNL63 NOT DETECTED   Final    CoronavirusHKU1 NOT DETECTED   Final    Coronavirus229E NOT DETECTED   Final    CoronavirusOC43 NOT DETECTED   Final   CLOSTRIDIUM DIFFICILE BY PCR     Status: Normal   Collection Time   06/13/11  1:14 PM      Component Value Range Status Comment   C difficile by pcr NEGATIVE  NEGATIVE  Final   CLOSTRIDIUM DIFFICILE BY PCR     Status: Normal   Collection Time   06/19/11 10:25 AM      Component Value Range Status Comment   C difficile by pcr NEGATIVE  NEGATIVE  Final   CULTURE, BLOOD (ROUTINE X 2)     Status: Normal   Collection Time   06/19/11 10:57 AM      Component Value Range Status  Comment   Specimen Description BLOOD LEFT ARM   Final    Special Requests BOTTLES DRAWN AEROBIC AND ANAEROBIC 10CC   Final    Culture  Setup Time [id number]   Final    Culture NO GROWTH 5 DAYS   Final    Report Status 06/25/2011 FINAL   Final   CULTURE, BLOOD (ROUTINE X 2)     Status: Normal   Collection Time   06/19/11 10:58 AM      Component Value Range Status Comment   Specimen Description BLOOD LEFT HAND   Final    Special Requests BOTTLES DRAWN AEROBIC AND ANAEROBIC 10CC   Final    Culture  Setup Time [id number]   Final    Culture NO GROWTH 5 DAYS   Final    Report Status 06/25/2011 FINAL   Final   SURGICAL PCR SCREEN     Status: Abnormal   Collection Time   06/27/11  7:47 AM      Component Value Range Status Comment   MRSA, PCR NEGATIVE  NEGATIVE  Final    Staphylococcus aureus POSITIVE (*) NEGATIVE  Final     Coagulation Studies: No results found for this basename: LABPROT:5,INR:5 in the last 72 hours  Urinalysis:  No results found for this basename: COLORURINE:2,APPERANCEUR:2,LABSPEC:2,PHURINE:2,GLUCOSEU:2,HGBUR:2,BILIRUBINUR:2,KETONESUR:2,PROTEINUR:2,UROBILINOGEN:2,NITRITE:2,LEUKOCYTESUR:2 in the last 72 hours    Imaging: No results found.   Medications:        . amiodarone  200 mg Oral Daily  . antiseptic oral rinse  15 mL Mouth Rinse QID  . chlorhexidine  15 mL Mouth Rinse BID  . collagenase   Topical Daily  . darbepoetin (ARANESP) injection - DIALYSIS  100 mcg Intravenous Q Thu-HD  . ferric gluconate (FERRLECIT/NULECIT) IV  125 mg Intravenous Q24 Hr x 2  . pantoprazole  40 mg Oral BID AC  . predniSONE  10 mg Oral Q breakfast  . sirolimus  0.75 mg Oral Daily   acetaminophen, albuterol, aluminum hydroxide, bisacodyl, diphenhydrAMINE, feeding supplement (NEPRO CARB STEADY), guaiFENesin-dextromethorphan, HYDROcodone-acetaminophen, methocarbamol, ondansetron (ZOFRAN) IV, ondansetron, polyethylene glycol, traZODone  Assessment/ Plan:  is a 56 y.o. yo male  s/p renal transplant who was admitted on 06/03/2011 with PNA pulm HTN, volume overload baseline Creatinine 2 s  1. PNA/parainfluenza/cardiomyopathy/hypotension- completed RX for paraflu. Has trach and requiring vent support. improved 2. AKI, oliguric, HD dependent with CRRT 5/13-No pulm edema. Do intermittent HD as needed. Will need dialysis today 3. 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 He is oliguric. Blood sirolimus level on 5/8 at 5 pm was 2.0  4. Anemia- hgb stable. Aranesp. Iron. 5. Secondary hyperparathyroidism- no treatment right now, rocaltrol stopped - not essential at this time and calcium up 6. PNA parainfluenza- finished course Urine output great. Will watch for recovery and dialyze PRN, weight up but good urine output does not appear volume overload.      LOS: 2 Jiselle Sheu W @TODAY @10 :46 AM

## 2011-06-29 NOTE — Progress Notes (Signed)
Physical Therapy Session Note  Patient Details  Name: Chad [name] MRN: [id number] Date of Birth: 12-Feb-1955  Today's Date: 06/29/2011 Time: 1025-1055 Time Calculation (min): 30 min  Short Term Goals: Week 1:  PT Short Term Goal 1 (Week 1): Pt will perform bed <> chair transfers consistently with  mod A PT Short Term Goal 2 (Week 1): Pt will gait with supervision 50' in controlled environment PT Short Term Goal 3 (Week 1): Pt will tolerate standing balance x 2 minutes for functional task  Skilled Therapeutic Interventions/Progress Updates:    Pt requesting to use the bathroom.  Performed a few LE exercises in bed 10-20 reps each prior to getting up to decrease stiffness and "wake" pt's legs up.  Supine to sit with close supervision, using bed rail and HOB elevated.  Sitting EOB with close supervision.  Sit to stand transfer from elevated bed for stand pivot transfer to W/c with min@.  Unable to stand from w/c in bathroom, used [employer] with assist of nursing to get pt onto toilet.  Nursing to follow up with pt when finished.  Overall pt tolerated tx well.  Therapy Documentation Precautions:  Precautions Precautions: Fall Restrictions Weight Bearing Restrictions: No    Pain:  No complaints of pain during treatment See FIM for current functional status  Therapy/Group: Individual Therapy  Georges Mouse 06/29/2011, 12:50 PM

## 2011-06-30 ENCOUNTER — Encounter (HOSPITAL_COMMUNITY): Payer: Self-pay | Admitting: Dentistry

## 2011-06-30 DIAGNOSIS — Z5189 Encounter for other specified aftercare: Secondary | ICD-10-CM

## 2011-06-30 DIAGNOSIS — G7281 Critical illness myopathy: Secondary | ICD-10-CM

## 2011-06-30 LAB — CBC
HCT: 26.5 % — ABNORMAL LOW (ref 39.0–52.0)
MCHC: 31.7 g/dL (ref 30.0–36.0)
MCV: 105.6 fL — ABNORMAL HIGH (ref 78.0–100.0)
Platelets: 195 10*3/uL (ref 150–400)
RDW: 20.6 % — ABNORMAL HIGH (ref 11.5–15.5)
WBC: 7.4 10*3/uL (ref 4.0–10.5)

## 2011-06-30 LAB — DIFFERENTIAL
Basophils Absolute: 0 10*3/uL (ref 0.0–0.1)
Basophils Relative: 1 % (ref 0–1)
Eosinophils Relative: 1 % (ref 0–5)
Monocytes Absolute: 1.2 10*3/uL — ABNORMAL HIGH (ref 0.1–1.0)
Neutro Abs: 4.2 10*3/uL (ref 1.7–7.7)

## 2011-06-30 LAB — RENAL FUNCTION PANEL
Albumin: 1.9 g/dL — ABNORMAL LOW (ref 3.5–5.2)
Calcium: 8.1 mg/dL — ABNORMAL LOW (ref 8.4–10.5)
Creatinine, Ser: 4.53 mg/dL — ABNORMAL HIGH (ref 0.50–1.35)
GFR calc Af Amer: 16 mL/min — ABNORMAL LOW (ref >90)
GFR calc non Af Amer: 13 mL/min — ABNORMAL LOW (ref >90)
Sodium: 131 mEq/L — ABNORMAL LOW (ref 135–145)

## 2011-06-30 MED ORDER — FUROSEMIDE 80 MG PO TABS
80.0000 mg | ORAL_TABLET | Freq: Two times a day (BID) | ORAL | Status: DC
  Administered 2011-06-30 – 2011-07-06 (×13): 80 mg via ORAL
  Filled 2011-06-30 (×15): qty 1

## 2011-06-30 NOTE — Progress Notes (Signed)
Social Work  Social Work Assessment and Plan  Patient Details  Name: Chad [name] MRN: [id number] Date of Birth: Aug 07, 1955  Today's Date: 06/30/2011  Problem List:  Patient Active Problem List  Diagnoses  . HCAP (healthcare-associated pneumonia)  . Thrombocytopenia  . Acute respiratory failure  . ARDS (adult respiratory distress syndrome)  . Renal transplant disorder  . Acute systolic heart failure  . Septic shock  . Acute renal failure  . Cardiomyopathy  . Critical illness myopathy   Past Medical History:  Past Medical History  Diagnosis Date  . Hypertension   . H/O kidney transplant   . Hepatitis   . Chronic kidney disease    Past Surgical History:  Past Surgical History  Procedure Date  . Nephrectomy transplanted organ   . Insertion of dialysis catheter 06/20/2011    Procedure: INSERTION OF DIALYSIS CATHETER;  Surgeon: [name], MD;  Location: [facility] OR;  Service: Vascular;  Laterality: Right;  Ultrasound guided insertion of right internal jugular dialysis catheter  . Multiple extractions with alveoloplasty 06/27/2011    Procedure: MULTIPLE EXTRACION WITH ALVEOLOPLASTY;  Surgeon: [name], DDS;  Location: [facility] OR;  Service: Oral Surgery;  Laterality: N/A;  Extraction  of tooth # 14 with alveoloplasty   Social History:  reports that he has never smoked. He does not have any smokeless tobacco history on file. He reports that he does not drink alcohol. His drug history not on file.  Family / Support Systems Marital Status: Single (but together with girlfriend x 25 years) Patient Roles: Parent;Partner;Other (Comment);Agricultural consultant (employee) Spouse/Significant Other: girlfriend, Linden Dolin ([place] [phone] Children: son, Arel Tippen (son of pt and girlfriend, Dennie Bible) - (C) [phone] Other Supports: pt's sister, Selena Batten plus several friends Anticipated Caregiver: girlfriend and son Caregiver Availability: 24/7 Family Dynamics: Pt describes very supportive, close relationship  with girlfriend and son - notes "they will do anything they can for me".  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  Speaks easily about "trying to take it all in" regarding his acute medical decline.  He is, overall, optimistic about his longer term recovery, yet guarded about ability to return to work.  No S/s of depression or anxiety with depression screen = 0.  No indication of need for psychological intervention at this time, however, will monitor throughout stay. 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  Very proud father and grandfather plus many friends in the community. Anticipated changes in roles/activities/participation: Pt's girlfriend prepared to assume any needed caregiver duties/ roles.  Pt aware he  may need assistance at d/c. Pt/family  expectations/goals: Pt hopeful he can eventually resume his independence and activities in the community.  Community Resources [employer]: None Premorbid Home Care/DME Agencies: None Transportation available at discharge: yes  Discharge Planning Living Arrangements: Alone Support Systems: Spouse/significant other;Children;Friends/neighbors;Church/faith community Type of Residence: Private residence Insurance Resources: Media planner (specify) (BCBS of [place]) Architect: Employment (eligible for STD and LTD) Financial Screen Referred: No Living Expenses: Database administrator Management: Patient Do you have any problems obtaining your medications?: No Home Management: pt Patient/Family Preliminary Plans: Pt plans to return to his home where girlfriend will stay and other family/ friends as needed. Social Work Anticipated Follow Up Needs: HH/OP Expected length of stay: 2-3 weeks  Clinical Impression Very pleasant, oriented and motivated gentleman here after VDRF/ trach and severely deconditioned.  Excellent family support and 24/7 assistance being arranged by his girlfriend.  No s/s of significant emotional distress, however, plan to monitor throughout stay.  Amada Jupiter  Cotey Rakes 06/30/2011, 5:03 PM

## 2011-06-30 NOTE — Progress Notes (Signed)
Speech Language Pathology Daily Session Note  Patient Details  Name: Chad [name] MRN: [id number] Date of Birth: 1955/12/20  Today's Date: 06/30/2011 Time: 7829-5621 Time Calculation (min): 45 min  Short Term Goals:  SLP Short Term Goal 1 (Week 1): Patient will wear PMSV during all waking hours without any changes in VS and no assistance. SLP Short Term Goal 2 (Week 1): Patient will donn/doff PMSV as a modified independent SLP Short Term Goal 3 (Week 1): Patient will utilize airway protection compensatory strategy of a chin tuck as a modified independent.  Skilled Therapeutic Interventions: Treatment focus on donning/doffing PMSV. Pt required Min verbal and demonstration cues to appropriately donn/doff PMSV (educated not to twist PMSV on and to utilize both hands when removing). Pt tolerated PMSV throughout session without any distress.   Daily Session FIM:  Comprehension Comprehension: 7-Follows complex conversation/direction: With no assist Expression Expression Mode: Verbal Expression: 5-Expresses complex 90% of the time/cues < 10% of the time Social Interaction Social Interaction: 6-Interacts appropriately with others with medication or extra time (anti-anxiety, antidepressant). Problem Solving Problem Solving: 7-Solves complex problems: Recognizes & self-corrects Memory Memory: 7-Complete Independence: No helper FIM - Eating Eating Activity: 5: Supervision/cues  Pain: No/Denies Pain   Therapy/Group: Individual Therapy  Gabriela Irigoyen 06/30/2011, 4:36 PM

## 2011-06-30 NOTE — Progress Notes (Signed)
Occupational Therapy Session Note  Patient Details  Name: Chad [name] MRN: [id number] Date of Birth: 25-Jan-1956  Today's Date: 06/30/2011 Time: 0830-0930 Time Calculation (min): 60 min  Short Term Goals: Week 1:  OT Short Term Goal 1 (Week 1): Patient will complete bathing, seated, in walk-in shower with min assist OT Short Term Goal 2 (Week 1): Patient will complete transfers bed/toilet/w/c with min assist OT Short Term Goal 3 (Week 1): Patient will demo improved functional endurance as evidenced by ability to complete bathing/dressing/grooming, seated and standing in 60 min. OT Short Term Goal 4 (Week 1): Patient will demo ability to use assistive devices, prn, with min assist  to complete lower body bathing and dressing OT Short Term Goal 5 (Week 1): Patient will demonstrate understanding of 3 prinicples of energy conservation during BADL.  Skilled Therapeutic Interventions/Progress Updates:    self care retraining at EOB focusing on activity tolerance, sit to stand from slightly elevated surface (same height as his 3:1, LB bathing and dressing with extra time and discussion of possible use of AE, standing tolerance (for 1 min x3), standing balance with bilateral UE support, stand step pivots with RW from bed to [facility], back to bed, to w/c with rest breaks in between. Pt's O2 sats remained above 95% with PMSV on for entire session.  Therapy Documentation Precautions:  Precautions Precautions: Fall Restrictions Weight Bearing Restrictions: No Vital Signs: Therapy Vitals Temp: 98.7 F (37.1 C) Temp src: Oral Pulse Rate: 86  Resp: 16  BP: 124/72 mmHg Patient Position, if appropriate: Lying Oxygen Therapy SpO2: 100 % O2 Device: None (Room air);Trach collar O2 Flow Rate (L/min): 5 L/min Pain:  no c/o pain  See FIM for current functional status  Therapy/Group: Individual Therapy  Roney Mans [facility] 06/30/2011, 9:32 AM

## 2011-06-30 NOTE — Consult Note (Signed)
WOC consult Note Reason for Consult: Consult requested for left leg ulcer.  Pt states he hit it on something at home prior to admission.  He has "had a transplant and has chronic lymphadenia" to BLE. Pt had WOC consult performed on 5/8.  Wound has improved in appearance and decreased in size and depth since that time. Wound type: Full thickness stasis ulcer Measurement: 1X1X.3cm Wound bed: 50% red, 50% yellow Drainage (amount, consistency, odor) Mod yellow drainage, no odor Periwound: Intact skin surrounding, generalized edema and erythremia. Dressing procedure/placement/frequency: Melburn Popper has been previously ordered to chemically debride non-viable tissue.  Continue present plan of care.  Protect site from tape stripping with silicone foam dressing. Will not plan to follow further unless re-consulted.  [address] RN, MSN, [employer]  [phone]

## 2011-06-30 NOTE — Care Management (Signed)
Inpatient Rehabilitation Center Individual Statement of Services  Patient Name:  Chad [name]  Date:  06/30/2011  Welcome to the [facility]patient [facility].  Our goal is to provide you with an individualized program based on your diagnosis and situation, designed to meet your specific needs.  With this comprehensive rehabilitation program, you will be expected to participate in at least 3 hours of rehabilitation therapies Monday-Friday, with modified therapy programming on the weekends.  Your rehabilitation program will include the following services:  Physical Therapy (PT), Occupational Therapy (OT), Speech Therapy (ST), 24 hour per day rehabilitation nursing, Therapeutic Recreaction (TR), Case Management (RN and Child psychotherapist), Rehabilitation Medicine, Nutrition Services and Pharmacy Services  Weekly team conferences will be held on  Tuesday  to discuss your progress.  Your RN Case Designer, television/film set will talk with you frequently to get your input and to update you on team discussions.  Team conferences with you and your family in attendance may also be held.  Expected length of stay: 3-4 weeks  Overall anticipated outcome: Supervision-Min Assist  Depending on your progress and recovery, your program may change.  Your RN Case Estate agent will coordinate services and will keep you informed of any changes.  Your RN Sports coach and SW names and contact numbers are listed  below.  The following services may also be recommended but are not provided by the Inpatient Rehabilitation Center:   Driving Evaluations  Home Health Rehabiltiation Services  Outpatient Rehabilitatation [facility]  Vocational Rehabilitation   Arrangements will be made to provide these services after discharge if needed.  Arrangements include referral to agencies that provide these services.  Your insurance has been verified to be:  [employer] [place] Your primary doctor is:  Dr. Marina Gravel  Pertinent information will be shared with your doctor and your insurance company.  Case Manager: Melanee Spry, [facility] [phone]  Social Worker:  [place], [place] [phone]  Information discussed with and copy given to patient by: [name], 06/30/2011, 12:27 PM

## 2011-06-30 NOTE — Progress Notes (Signed)
S:eating well  Working with PT.  Bowels moving  Only 200cc UO recorded yest?  Already has more than that in foley O:BP 124/72  Pulse 86  Temp(Src) 98.7 F (37.1 C) (Oral)  Resp 16  Ht 6' (1.829 m)  Wt 113.626 kg (250 lb 8 oz)  BMI 33.97 kg/m2  SpO2 100%  Intake/Output Summary (Last 24 hours) at 06/30/11 0929 Last data filed at 06/30/11 0524  Gross per 24 hour  Intake    480 ml  Output    200 ml  Net    280 ml   Weight change:  ZOX:WRUEA and alert CVS:RRR Resp:Clear Abd:+ BS NTND VWU:JWJXBJ edema in both lower ext NEURO:CNI Ox3      . amiodarone  200 mg Oral Daily  . antiseptic oral rinse  15 mL Mouth Rinse QID  . chlorhexidine  15 mL Mouth Rinse BID  . collagenase   Topical Daily  . darbepoetin (ARANESP) injection - DIALYSIS  100 mcg Intravenous Q Thu-HD  . ferric gluconate (FERRLECIT/NULECIT) IV  125 mg Intravenous Q24 Hr x 2  . pantoprazole  40 mg Oral BID AC  . predniSONE  10 mg Oral Q breakfast  . sirolimus  0.75 mg Oral Daily   No results found. BMET    Component Value Date/Time   NA 131* 06/30/2011 0635   K 3.9 06/30/2011 0635   CL 96 06/30/2011 0635   CO2 21 06/30/2011 0635   GLUCOSE 87 06/30/2011 0635   BUN 59* 06/30/2011 0635   CREATININE 4.53* 06/30/2011 0635   CALCIUM 8.1* 06/30/2011 0635   GFRNONAA 13* 06/30/2011 0635   GFRAA 16* 06/30/2011 0635   CBC    Component Value Date/Time   WBC 7.4 06/30/2011 0625   RBC 2.51* 06/30/2011 0625   HGB 8.4* 06/30/2011 0625   HCT 26.5* 06/30/2011 0625   PLT 195 06/30/2011 0625   MCV 105.6* 06/30/2011 0625   MCH 33.5 06/30/2011 0625   MCHC 31.7 06/30/2011 0625   RDW 20.6* 06/30/2011 0625   LYMPHSABS 2.0 06/30/2011 0625   MONOABS 1.2* 06/30/2011 0625   EOSABS 0.1 06/30/2011 0625   BASOSABS 0.0 06/30/2011 0625     Assessment: 1. 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 PNA, resolving 3. Anemia 4.  Sec HPTH  Off rocaltrol for now Plan: 1. No need for HD now but will follow closely. If Scr cont to  rise then may need a treatment later this week 2. Start po lasix 3. Daily Scr  Jaece Ducharme T

## 2011-06-30 NOTE — Progress Notes (Signed)
Brief Nutrition Note  RD drawn to chart 2/2 Nutrition Risk Report. Pt triggered for Unintentional Wt Loss.  Pt was followed by RD during acute hospitalization. Pt was started on enteral nutrition during intubation and continued on nutrition support until initiation of diet on 5/28. Pt was advanced to Renal diet with thin liquids at that time.  Per nephrology, no need for HD now, but will be followed closely. PO lasix initiated.  Usual weight is 290 lb - per pt. Current wt is down to 250 lb. Intake is optimal, pt eating 100%.  Pt states that his appetite is great at this time. Pt reports that he has had a significant amount of fluid lost during this admission, but intake since initiation of diet has been optimal.  Current Phosphorus and Potassium WNL. This RD reviewed the Kidney Disease Food Pyramid with pt and discussed current diet restrictions. Pt asked appropriate questions and suspect good compliance with diet when d/c.  Re-consult RD for any additional nutrition needs.  Adair Laundry Pager # [phone]

## 2011-06-30 NOTE — Progress Notes (Signed)
Occupational Therapy Session Note  Patient Details  Name: Chad [name] MRN: [id number] Date of Birth: 11/06/1955  Today's Date: 06/30/2011 Time: 1400-1430 Time Calculation (min): 30 min  Short Term Goals: Week 1:  OT Short Term Goal 1 (Week 1): Patient will complete bathing, seated, in walk-in shower with min assist OT Short Term Goal 2 (Week 1): Patient will complete transfers bed/toilet/w/c with min assist OT Short Term Goal 3 (Week 1): Patient will demo improved functional endurance as evidenced by ability to complete bathing/dressing/grooming, seated and standing in 60 min. OT Short Term Goal 4 (Week 1): Patient will demo ability to use assistive devices, prn, with min assist  to complete lower body bathing and dressing OT Short Term Goal 5 (Week 1): Patient will demonstrate understanding of 3 prinicples of energy conservation during BADL.  Skilled Therapeutic Interventions/Progress Updates:    1:1 therapeutic activity: focusing on sit to stand from w/c with mod A with pushing up from arm rests of chair, standing tolerance with stand to squat  x10 3 times, maintaining squat with bilateral UE support for 5 sec x4 and then coming back into full stance. Able to perform short distance ambulation from sink to EOB with RW with minA with mod A for sit to stand.  Therapy Documentation Precautions:  Precautions Precautions: Fall Restrictions Weight Bearing Restrictions: No Vital Signs: Therapy Vitals Pulse Rate: 89  Resp: 16  Patient Position, if appropriate: Sitting Oxygen Therapy SpO2: 98 % O2 Device: None (Room air) Pain:  no c/o pain  See FIM for current functional status  Therapy/Group: Individual Therapy  Roney Mans [facility] 06/30/2011, 2:57 PM

## 2011-06-30 NOTE — Progress Notes (Signed)
Patient ID: Chad [name], male   DOB: 25-Dec-1955, 56 y.o.   MRN: [id number] Patient ID: Chad [name], male   DOB: 1955/03/08, 56 y.o.   MRN: [id number] Subjective/Complaints: Chad [name] is an 56 y.o. Male with history of renal transplant, Hep C, admitted 06/03/11 with cough, dyspnea and fever. Patient progressed to VDRF requiring intubation 05/08.  Patient trached 5/20 and tolerating trach collar. He developed fever due to cellulitis LE and IV vancomycin X 7 days recommended by ID. 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 [name] to CFS #6 on 05/29.    Resp therapist notes a good cough, may be ready to downsize soon.        Review of Systems  Respiratory: Negative for cough and shortness of breath.   Cardiovascular: Positive for leg swelling.       Chronic dorsum of L foot  Gastrointestinal: Negative.   Genitourinary:       Good output  Neurological: Positive for focal weakness and weakness.       Both legs weak  All other systems reviewed and are negative.    Objective: Vital Signs: Blood pressure 124/72, pulse 86, temperature 98.7 F (37.1 C), temperature source Oral, resp. rate 16, height 6' (1.829 m), weight 113.626 kg (250 lb 8 oz), SpO2 100.00%. No results found. Results for orders placed during the hospital encounter of 06/27/11 (from the past 72 hour(s))  RENAL FUNCTION PANEL     Status: Abnormal   Collection Time   06/28/11  6:25 AM      Component Value Range Comment   Sodium 132 (*) 135 - 145 (mEq/L)    Potassium 4.0  3.5 - 5.1 (mEq/L)    Chloride 95 (*) 96 - 112 (mEq/L)    CO2 25  19 - 32 (mEq/L)    Glucose, Bld 93  70 - 99 (mg/dL)    BUN 47 (*) 6 - 23 (mg/dL)    Creatinine, Ser 1.30 (*) 0.50 - 1.35 (mg/dL)    Calcium 8.2 (*) 8.4 - 10.5 (mg/dL)    Phosphorus 4.8 (*) 2.3 - 4.6 (mg/dL)    Albumin 1.8 (*) 3.5 - 5.2 (g/dL)    GFR calc non Af Amer 16 (*) >90 (mL/min)    GFR calc Af Amer 18 (*) >90 (mL/min)     HEPATITIS B CORE ANTIBODY, IGM     Status: Normal   Collection Time   06/28/11 11:45 AM      Component Value Range Comment   Hep B C IgM NEGATIVE  NEGATIVE    RENAL FUNCTION PANEL     Status: Abnormal   Collection Time   06/29/11  5:00 AM      Component Value Range Comment   Sodium 134 (*) 135 - 145 (mEq/L)    Potassium 4.2  3.5 - 5.1 (mEq/L)    Chloride 98  96 - 112 (mEq/L)    CO2 25  19 - 32 (mEq/L)    Glucose, Bld 86  70 - 99 (mg/dL)    BUN 53 (*) 6 - 23 (mg/dL)    Creatinine, Ser 8.65 (*) 0.50 - 1.35 (mg/dL)    Calcium 8.3 (*) 8.4 - 10.5 (mg/dL)    Phosphorus 4.6  2.3 - 4.6 (mg/dL)    Albumin 1.9 (*) 3.5 - 5.2 (g/dL)    GFR calc non Af Amer 15 (*) >90 (mL/min)    GFR calc Af Amer 17 (*) >90 (mL/min)  CBC     Status: Abnormal   Collection Time   06/30/11  6:25 AM      Component Value Range Comment   WBC 7.4  4.0 - 10.5 (K/uL)    RBC 2.51 (*) 4.22 - 5.81 (MIL/uL)    Hemoglobin 8.4 (*) 13.0 - 17.0 (g/dL)    HCT 16.1 (*) 09.6 - 52.0 (%)    MCV 105.6 (*) 78.0 - 100.0 (fL)    MCH 33.5  26.0 - 34.0 (pg)    MCHC 31.7  30.0 - 36.0 (g/dL)    RDW 04.5 (*) 40.9 - 15.5 (%)    Platelets 195  150 - 400 (K/uL)   DIFFERENTIAL     Status: Abnormal   Collection Time   06/30/11  6:25 AM      Component Value Range Comment   Neutrophils Relative 56  43 - 77 (%)    Neutro Abs 4.2  1.7 - 7.7 (K/uL)    Lymphocytes Relative 27  12 - 46 (%)    Lymphs Abs 2.0  0.7 - 4.0 (K/uL)    Monocytes Relative 16 (*) 3 - 12 (%)    Monocytes Absolute 1.2 (*) 0.1 - 1.0 (K/uL)    Eosinophils Relative 1  0 - 5 (%)    Eosinophils Absolute 0.1  0.0 - 0.7 (K/uL)    Basophils Relative 1  0 - 1 (%)    Basophils Absolute 0.0  0.0 - 0.1 (K/uL)   RENAL FUNCTION PANEL     Status: Abnormal   Collection Time   06/30/11  6:35 AM      Component Value Range Comment   Sodium 131 (*) 135 - 145 (mEq/L)    Potassium 3.9  3.5 - 5.1 (mEq/L)    Chloride 96  96 - 112 (mEq/L)    CO2 21  19 - 32 (mEq/L)    Glucose, Bld 87  70 - 99  (mg/dL)    BUN 59 (*) 6 - 23 (mg/dL)    Creatinine, Ser 8.11 (*) 0.50 - 1.35 (mg/dL)    Calcium 8.1 (*) 8.4 - 10.5 (mg/dL)    Phosphorus 4.3  2.3 - 4.6 (mg/dL)    Albumin 1.9 (*) 3.5 - 5.2 (g/dL)    GFR calc non Af Amer 13 (*) >90 (mL/min)    GFR calc Af Amer 16 (*) >90 (mL/min)     HEENT: normal Cardio: RRR Resp: CTA B/L GI: BS positive Extremity:  Edema dorsum L foot Skin:   Intact Neuro: Alert/Oriented, Normal Sensory and Abnormal Motor 3-/5 B Hip flexor and Knee extensor Musc/Skel:  Normal Edema 2+ on either limb. Wound on back of left calf with decreased fibronecrotic material.  #6 trach intact with PMV. Speaking well. No distress. Chest clear.   Assessment/Plan: 1. Functional deficits secondary to critical illness myopathy which require 3+ hours per day of interdisciplinary therapy in a comprehensive inpatient rehab setting. Physiatrist is providing close team supervision and 24 hour management of active medical problems listed below. Physiatrist and rehab team continue to assess barriers to discharge/monitor patient progress toward functional and medical goals. FIM: FIM - Bathing Bathing Steps Patient Completed: Chest;Right Arm;Left Arm;Abdomen;Front perineal area;Left upper leg;Right upper leg Bathing: 3: Mod-Patient completes 5-7 29f 10 parts or 50-74%  FIM - Upper Body Dressing/Undressing Upper body dressing/undressing steps patient completed: Thread/unthread right sleeve of pullover shirt/dresss;Thread/unthread left sleeve of pullover shirt/dress;Put head through opening of pull over shirt/dress;Pull shirt over trunk Upper body dressing/undressing: 5: Set-up assist  to: Obtain clothing/put away FIM - Lower Body Dressing/Undressing Lower body dressing/undressing steps patient completed: Thread/unthread right underwear leg;Thread/unthread right pants leg Lower body dressing/undressing: 2: Max-Patient completed 25-49% of tasks  FIM - Toileting Toileting: 1: Total-Patient  completed zero steps, helper did all 3  FIM - Toilet Transfers Toilet Transfers: 3-To toilet/BSC: Mod A (lift or lower assist);3-From toilet/BSC: Mod A (lift or lower assist)  FIM - Bed/Chair Transfer Bed/Chair Transfer Assistive Devices: Bed rails;HOB elevated;Walker Bed/Chair Transfer: 5: Supine > Sit: Supervision (verbal cues/safety issues);3: Sit > Supine: Mod A (lifting assist/Pt. 50-74%/lift 2 legs);3: Bed > Chair or W/C: Mod A (lift or lower assist)  FIM - Locomotion: Wheelchair Distance: 20' Locomotion: Wheelchair: 0: Activity did not occur FIM - Locomotion: Ambulation Ambulation/Gait Assistance: 4: Min assist Locomotion: Ambulation: 0: Activity did not occur  Comprehension Comprehension Mode: Auditory Comprehension: 7-Follows complex conversation/direction: With no assist  Expression Expression Mode: Verbal Expression Assistive Devices: 6-Talk trach valve Expression: 7-Expresses complex ideas: With no assist  Social Interaction Social Interaction: 7-Interacts appropriately with others - No medications needed.  Problem Solving Problem Solving: 7-Solves complex problems: Recognizes & self-corrects  Memory Memory: 7-Complete Independence: No helper  Medical Problem List and Plan:  1. DVT Prophylaxis/Anticoagulation: Mechanical: Sequential compression devices, below knee Bilateral lower extremities  2. Pain Management: Continue hydrocodone prn  3. Mood: Monitor for now. Seems very motivated.  4. Renal transplant with chronic allograft nephropathy: continue HD on prn basis per nephrology. Fluid overload improving. Strict I and O. Continue prednisone and sirolimus. Po lasix initiated. Would like to dc foley, but i's and o's are being tracked closely by nephrology. Will continue it for now. 5. Left shin ulcer:   6. Afib: Continue amiodarone. ?coumadin per cardiology.  7. Anemia of chronic disease:  Continue aranesp weekly. 8. Pulmonary toilet- downsize trach to #4  LOS  (Days) 3 A FACE TO FACE EVALUATION WAS PERFORMED  Hazeline Charnley T 06/30/2011, 9:45 AM

## 2011-06-30 NOTE — Progress Notes (Signed)
Physical Therapy Note  Patient Details  Name: Chad [name] MRN: [id number] Date of Birth: 11-Jun-1955 Today's Date: 06/30/2011  1000-1055 (55 minutes) individual Pain: no complaint of pain  Oxygen sats (resting) 96% RA pulse 85 Focus of treatment: Therapeutic activities/exercises to improve tolerance to activity/ quad strengthening; Gait training/endurance with RW   Treatment: Tranfers SPT min/mod assist sit to stand from wc/min assist transfer wc>< Nustep (ergonometer) Level 1 X 10 minutes with one rest break (Oxygen sats 94-96% RA pulse 90 ); gait 10 feet X 1 RW followed by wc for safety ( Oxygen satrs 97% pulse 116 with 4/4 dyspnea)  Emmett Bracknell,JIM 06/30/2011, 10:58 AM

## 2011-06-30 NOTE — Progress Notes (Signed)
Patient information reviewed and entered into [facility] system by Leonia Heatherly, RN, CRRN, PPS Coordinator.  Information including medical coding and functional independence measure will be reviewed and updated through discharge.    

## 2011-06-30 NOTE — Procedures (Signed)
Tracheostomy Change Note  Patient Details:   Name: Chad [name] DOB: [birth year] MRN: [id number]    Airway Documentation: Airway (Active)     Airway (Active)  Trach downsized from 6 cuffless to 4 cuffless.   Evaluation  O2 sats: stable throughout Complications: No apparent complications Patient did tolerate procedure well. Bilateral Breath Sounds: Clear;Diminished    Merrilee Seashore 06/30/2011, 4:10 PM

## 2011-07-01 LAB — RENAL FUNCTION PANEL
Albumin: 2 g/dL — ABNORMAL LOW (ref 3.5–5.2)
BUN: 63 mg/dL — ABNORMAL HIGH (ref 6–23)
Calcium: 8.5 mg/dL (ref 8.4–10.5)
Creatinine, Ser: 4.72 mg/dL — ABNORMAL HIGH (ref 0.50–1.35)
Phosphorus: 4.8 mg/dL — ABNORMAL HIGH (ref 2.3–4.6)

## 2011-07-01 NOTE — Evaluation (Signed)
Recreational Therapy Assessment and Plan  Patient Details  Name: Chad [name] MRN: [id number] Date of Birth: 1955-08-04 Today's Date: 07/01/2011  Rehab Potential: Good ELOS: 3-4 weeks   Assessment Clinical Impression:Problem List:  Patient Active Problem List   Diagnoses   .  HCAP (healthcare-associated pneumonia)   .  Thrombocytopenia   .  Acute respiratory failure   .  ARDS (adult respiratory distress syndrome)   .  Renal transplant disorder   .  Acute systolic heart failure   .  Septic shock   .  Acute renal failure   .  Cardiomyopathy   .  Critical illness myopathy    Past Medical History:  Past Medical History   Diagnosis  Date   .  Hypertension    .  H/O kidney transplant    .  Hepatitis    .  Chronic kidney disease     Past Surgical History:  Past Surgical History   Procedure  Date   .  Nephrectomy transplanted organ    .  Insertion of dialysis catheter  06/20/2011     Procedure: INSERTION OF DIALYSIS CATHETER; Surgeon: [name], MD; Location: [facility] OR; Service: Vascular; Laterality: Right; Ultrasound guided insertion of right internal jugular dialysis catheter    Assessment & Plan  Clinical Impression: Patient is a 56 y.o. year old male with recent admission to the hospital on 06/03/11 with cough, dyspnea and fever. Patient progressed to VDRF requiring intubation 05/08. BAL postive for parainfluenza virus and patient treated with antifungal, IVIG and broad spectrum antibiotics. Patient noted to require pressors due to combination of septic and cardiogenic shock. 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 He was noted to be oliguric with lymphedema and started on CVVHD with improvement. Patient trached 5/20 and tolerating trach collar. He developed fever due to cellulitis LE and IV vancomycin X 7 days recommended by ID. Urine output improving and being dialyzed on prn basis for chronic  allograft nephropathy. Patient transferred to CIR on 06/27/2011 .   Patient presents with decreased activity tolerance, decreased funtional mobility, decreased balance limiting pt's independence with leisure/community pursuits   Leisure History/Participation Premorbid leisure interest/current participation: Software engineer (Comment);Community - Travel (Comment);Community - Journalist, newspaper - Engineer, civil (consulting) (extensive volunteer work, works Teacher, English as a foreign language) Other Leisure Interests: Television;Reading;Computer Leisure Participation Style: With Family/Friends Awareness of Community Resources: Excellent Psychosocial / Spiritual Spiritual Interests: Church Social interaction - Mood/Behavior: Cooperative Firefighter Appropriate for Education?: Yes Recreational Therapy Orientation Orientation -Reviewed with patient: Available activity resources Strengths/Weaknesses Patient Strengths/Abilities: Willingness to participate;Active premorbidly Patient weaknesses: Physical limitations  Plan Rec Therapy Plan Is patient appropriate for Therapeutic Recreation?: Yes Rehab Potential: Good Treatment times per week: Min 1 time per week > 20 minutes Estimated Length of Stay: 3-4 weeks TR Treatment/Interventions: Adaptive equipment instruction;1:1 session;Balance/vestibular training;Community reintegration;Functional mobility training;Patient/family education;Recreation/leisure participation;Therapeutic activities;Therapeutic exercise;UE/LE Coordination activities  Recommendations for other services: None  Discharge Criteria: Patient will be discharged from TR if patient refuses treatment 3 consecutive times without medical reason.  If treatment goals not met, if there is a change in medical status, if patient makes no progress towards goals or if patient is discharged from hospital.  The above assessment, treatment plan, treatment alternatives and goals were discussed and mutually agreed upon: by  patient  Tayah Idrovo 07/01/2011, 12:20 PM

## 2011-07-01 NOTE — Progress Notes (Signed)
S:eating well  Working with PT. No SOB O:BP 116/73  Pulse 82  Temp(Src) 98.5 F (36.9 C) (Oral)  Resp 18  Ht 6' (1.829 m)  Wt 118 kg (260 lb 2.3 oz)  BMI 35.28 kg/m2  SpO2 96%  Intake/Output Summary (Last 24 hours) at 07/01/11 0839 Last data filed at 07/01/11 0500  Gross per 24 hour  Intake      0 ml  Output    700 ml  Net   -700 ml   Weight change:  ZOX:WRUEA and alert CVS:RRR Resp:Clear Abd:+ BS NTND Ext:4+ edema NEURO:CNI Ox3 Rt perm cath      . amiodarone  200 mg Oral Daily  . antiseptic oral rinse  15 mL Mouth Rinse QID  . chlorhexidine  15 mL Mouth Rinse BID  . collagenase   Topical Daily  . darbepoetin (ARANESP) injection - DIALYSIS  100 mcg Intravenous Q Thu-HD  . furosemide  80 mg Oral BID  . pantoprazole  40 mg Oral BID AC  . predniSONE  10 mg Oral Q breakfast  . sirolimus  0.75 mg Oral Daily   No results found. BMET    Component Value Date/Time   NA 132* 07/01/2011 0650   K 4.0 07/01/2011 0650   CL 95* 07/01/2011 0650   CO2 22 07/01/2011 0650   GLUCOSE 72 07/01/2011 0650   BUN 63* 07/01/2011 0650   CREATININE 4.72* 07/01/2011 0650   CALCIUM 8.5 07/01/2011 0650   GFRNONAA 13* 07/01/2011 0650   GFRAA 15* 07/01/2011 0650   CBC    Component Value Date/Time   WBC 7.4 06/30/2011 0625   RBC 2.51* 06/30/2011 0625   HGB 8.4* 06/30/2011 0625   HCT 26.5* 06/30/2011 0625   PLT 195 06/30/2011 0625   MCV 105.6* 06/30/2011 0625   MCH 33.5 06/30/2011 0625   MCHC 31.7 06/30/2011 0625   RDW 20.6* 06/30/2011 0625   LYMPHSABS 2.0 06/30/2011 0625   MONOABS 1.2* 06/30/2011 0625   EOSABS 0.1 06/30/2011 0625   BASOSABS 0.0 06/30/2011 0625     Assessment: 1. 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 PNA, resolving 3. Anemia 4.  Sec HPTH  Off rocaltrol for now Plan: 1. Cont to follow Scr 2.  No HD today 3. DC foley and keep I/O's  Symiah Nowotny T

## 2011-07-01 NOTE — Progress Notes (Signed)
Physical Therapy Note  Patient Details  Name: [name] MRN: [id number] Date of Birth: 1955/12/02 Today's Date: 07/01/2011  Time: 1330-1400 30 minutes  No c/o pain.  Sit to stand from w/c mod-max A this pm.  Nu step for LE strength and endurance level 4 x 10 minutes at 40-50 strides/minute with no rest.  Pt very motivated to improve.  Pt required 3-4 minute rest after nustep before being mod A sit to stand.  Gait x 10' with RW with min A.  Pt requires 2-3 minutes standing before feeling confident enough to attempt gait.  Pt continues with limited activity tolerance but is progressing daily.  Individual therapy   Sariyah Corcino 07/01/2011, 3:40 PM

## 2011-07-01 NOTE — Progress Notes (Signed)
Occupational Therapy Session Note  Patient Details  Name: Chad [name] MRN: [id number] Date of Birth: 11-Nov-1955  Today's Date: 07/01/2011 Time: 1300-1330 Time Calculation (min): 30 min  Short Term Goals: Week 1:  OT Short Term Goal 1 (Week 1): Patient will complete bathing, seated, in walk-in shower with min assist OT Short Term Goal 2 (Week 1): Patient will complete transfers bed/toilet/w/c with min assist OT Short Term Goal 3 (Week 1): Patient will demo improved functional endurance as evidenced by ability to complete bathing/dressing/grooming, seated and standing in 60 min. OT Short Term Goal 4 (Week 1): Patient will demo ability to use assistive devices, prn, with min assist  to complete lower body bathing and dressing OT Short Term Goal 5 (Week 1): Patient will demonstrate understanding of 3 prinicples of energy conservation during BADL.  Skilled Therapeutic Interventions/Progress Updates:    1:1 focus on functional ambulation into bathroom with RW from the doorway to the toilet with 3:1 over toilet with mod A for sit to stand and verbal cues for hand placement. Introduced use of sock aid to don socks utilizing energy conservation techniques, donning and doffing socks and shoes with reacher.  Therapy Documentation Precautions:  Precautions Precautions: Fall Restrictions Weight Bearing Restrictions: No Vital Signs: Therapy Vitals Pulse Rate: 87  Resp: 16  Patient Position, if appropriate: Sitting Oxygen Therapy SpO2: 97 % O2 Device: None (Room air) Pain:  no c/o pain  See FIM for current functional status  Therapy/Group: Individual Therapy  Roney Mans [facility] 07/01/2011, 3:25 PM

## 2011-07-01 NOTE — Progress Notes (Addendum)
Subjective/Complaints: Good day yesterday. Tolerated downsize well.      Review of Systems  Respiratory: Negative for cough and shortness of breath.   Cardiovascular: Positive for leg swelling.       Chronic dorsum of L foot  Gastrointestinal: Negative.   Genitourinary:       Good output  Neurological: Positive for focal weakness and weakness.       Both legs weak  All other systems reviewed and are negative.    Objective: Vital Signs: Blood pressure 116/73, pulse 82, temperature 98.5 F (36.9 C), temperature source Oral, resp. rate 18, height 6' (1.829 m), weight 118 kg (260 lb 2.3 oz), SpO2 96.00%. No results found. Results for orders placed during the hospital encounter of 06/27/11 (from the past 72 hour(s))  HEPATITIS B CORE ANTIBODY, IGM     Status: Normal   Collection Time   06/28/11 11:45 AM      Component Value Range Comment   Hep B C IgM NEGATIVE  NEGATIVE    RENAL FUNCTION PANEL     Status: Abnormal   Collection Time   06/29/11  5:00 AM      Component Value Range Comment   Sodium 134 (*) 135 - 145 (mEq/L)    Potassium 4.2  3.5 - 5.1 (mEq/L)    Chloride 98  96 - 112 (mEq/L)    CO2 25  19 - 32 (mEq/L)    Glucose, Bld 86  70 - 99 (mg/dL)    BUN 53 (*) 6 - 23 (mg/dL)    Creatinine, Ser 4.09 (*) 0.50 - 1.35 (mg/dL)    Calcium 8.3 (*) 8.4 - 10.5 (mg/dL)    Phosphorus 4.6  2.3 - 4.6 (mg/dL)    Albumin 1.9 (*) 3.5 - 5.2 (g/dL)    GFR calc non Af Amer 15 (*) >90 (mL/min)    GFR calc Af Amer 17 (*) >90 (mL/min)   CBC     Status: Abnormal   Collection Time   06/30/11  6:25 AM      Component Value Range Comment   WBC 7.4  4.0 - 10.5 (K/uL)    RBC 2.51 (*) 4.22 - 5.81 (MIL/uL)    Hemoglobin 8.4 (*) 13.0 - 17.0 (g/dL)    HCT 81.1 (*) 91.4 - 52.0 (%)    MCV 105.6 (*) 78.0 - 100.0 (fL)    MCH 33.5  26.0 - 34.0 (pg)    MCHC 31.7  30.0 - 36.0 (g/dL)    RDW 78.2 (*) 95.6 - 15.5 (%)    Platelets 195  150 - 400 (K/uL)   DIFFERENTIAL     Status: Abnormal   Collection Time   06/30/11  6:25 AM      Component Value Range Comment   Neutrophils Relative 56  43 - 77 (%)    Neutro Abs 4.2  1.7 - 7.7 (K/uL)    Lymphocytes Relative 27  12 - 46 (%)    Lymphs Abs 2.0  0.7 - 4.0 (K/uL)    Monocytes Relative 16 (*) 3 - 12 (%)    Monocytes Absolute 1.2 (*) 0.1 - 1.0 (K/uL)    Eosinophils Relative 1  0 - 5 (%)    Eosinophils Absolute 0.1  0.0 - 0.7 (K/uL)    Basophils Relative 1  0 - 1 (%)    Basophils Absolute 0.0  0.0 - 0.1 (K/uL)   RENAL FUNCTION PANEL     Status: Abnormal   Collection Time   06/30/11  6:35 AM      Component Value Range Comment   Sodium 131 (*) 135 - 145 (mEq/L)    Potassium 3.9  3.5 - 5.1 (mEq/L)    Chloride 96  96 - 112 (mEq/L)    CO2 21  19 - 32 (mEq/L)    Glucose, Bld 87  70 - 99 (mg/dL)    BUN 59 (*) 6 - 23 (mg/dL)    Creatinine, Ser 1.61 (*) 0.50 - 1.35 (mg/dL)    Calcium 8.1 (*) 8.4 - 10.5 (mg/dL)    Phosphorus 4.3  2.3 - 4.6 (mg/dL)    Albumin 1.9 (*) 3.5 - 5.2 (g/dL)    GFR calc non Af Amer 13 (*) >90 (mL/min)    GFR calc Af Amer 16 (*) >90 (mL/min)     HEENT: normal Cardio: RRR Resp: CTA B/L GI: BS positive Extremity:  Edema dorsum L foot Skin:   Intact Neuro: Alert/Oriented, Normal Sensory and Abnormal Motor 3-/5 B Hip flexor and Knee extensor Musc/Skel:  Normal Edema 2+ on either limb. Wound on back of left calf with decreased fibronecrotic material.  #4 trach--able to speak without trach occluded. No distress. Chest clear.   Assessment/Plan: 1. Functional deficits secondary to critical illness myopathy which require 3+ hours per day of interdisciplinary therapy in a comprehensive inpatient rehab setting. Physiatrist is providing close team supervision and 24 hour management of active medical problems listed below. Physiatrist and rehab team continue to assess barriers to discharge/monitor patient progress toward functional and medical goals. FIM: FIM - Bathing Bathing Steps Patient Completed: Chest;Right Arm;Left  Arm;Abdomen;Front perineal area;Left upper leg;Right upper leg Bathing: 3: Mod-Patient completes 5-7 40f 10 parts or 50-74%  FIM - Upper Body Dressing/Undressing Upper body dressing/undressing steps patient completed: Thread/unthread right sleeve of pullover shirt/dresss;Thread/unthread left sleeve of pullover shirt/dress;Put head through opening of pull over shirt/dress;Pull shirt over trunk Upper body dressing/undressing: 5: Set-up assist to: Obtain clothing/put away FIM - Lower Body Dressing/Undressing Lower body dressing/undressing steps patient completed: Thread/unthread right underwear leg;Thread/unthread right pants leg Lower body dressing/undressing: 2: Max-Patient completed 25-49% of tasks  FIM - Toileting Toileting: 1: Total-Patient completed zero steps, helper did all 3  FIM - Toilet Transfers Toilet Transfers: 3-To toilet/BSC: Mod A (lift or lower assist);3-From toilet/BSC: Mod A (lift or lower assist)  FIM - Bed/Chair Transfer Bed/Chair Transfer Assistive Devices: Bed rails;HOB elevated;Walker Bed/Chair Transfer: 5: Supine > Sit: Supervision (verbal cues/safety issues);3: Sit > Supine: Mod A (lifting assist/Pt. 50-74%/lift 2 legs);3: Bed > Chair or W/C: Mod A (lift or lower assist)  FIM - Locomotion: Wheelchair Distance: 20' Locomotion: Wheelchair: 0: Activity did not occur FIM - Locomotion: Ambulation Locomotion: Ambulation Assistive Devices: Designer, industrial/product Ambulation/Gait Assistance: 4: Min guard Locomotion: Ambulation: 1: Travels less than 50 ft with minimal assistance (Pt.>75%)  Comprehension Comprehension Mode: Auditory Comprehension: 6-Follows complex conversation/direction: With extra time/assistive device  Expression Expression Mode: Verbal Expression Assistive Devices: 6-Talk trach valve Expression: 6-Expresses complex ideas: With extra time/assistive device  Social Interaction Social Interaction: 6-Interacts appropriately with others with medication or  extra time (anti-anxiety, antidepressant).  Problem Solving Problem Solving: 7-Solves complex problems: Recognizes & self-corrects  Memory Memory: 7-Complete Independence: No helper  Medical Problem List and Plan:  1. DVT Prophylaxis/Anticoagulation: Mechanical: Sequential compression devices, below knee Bilateral lower extremities-gait 2. Pain Management: Continue hydrocodone prn  3. Mood: Monitor for now. Seems very motivated.  4. Renal transplant with chronic allograft nephropathy: continue HD on prn basis per nephrology. Fluid overload improving.  Strict I and O. Continue prednisone and sirolimus. Po lasix initiated. Would like to dc foley, but i's and o's are being tracked closely by nephrology. Will continue it for the time being. 5. Left shin ulcer:   6. Afib: Continue amiodarone. ?coumadin per cardiology.  7. Anemia of chronic disease:  Continue aranesp weekly. 8. Pulmonary toilet- downsized trach to #4 without issues.  Plugging trials  qhs  LOS (Days) 4 A FACE TO FACE EVALUATION WAS PERFORMED  Christpoher Sievers T 07/01/2011, 7:47 AM

## 2011-07-01 NOTE — Progress Notes (Signed)
Occupational Therapy Session Note  Patient Details  Name: Chad [name] MRN: [id number] Date of Birth: 1955/12/05  Today's Date: 07/01/2011 Time: 0830-0930 Time Calculation (min): 60 min  Short Term Goals: Week 1:  OT Short Term Goal 1 (Week 1): Patient will complete bathing, seated, in walk-in shower with min assist OT Short Term Goal 2 (Week 1): Patient will complete transfers bed/toilet/w/c with min assist OT Short Term Goal 3 (Week 1): Patient will demo improved functional endurance as evidenced by ability to complete bathing/dressing/grooming, seated and standing in 60 min. OT Short Term Goal 4 (Week 1): Patient will demo ability to use assistive devices, prn, with min assist  to complete lower body bathing and dressing OT Short Term Goal 5 (Week 1): Patient will demonstrate understanding of 3 prinicples of energy conservation during BADL.  Skilled Therapeutic Interventions/Progress Updates:    1:1 self care retraining: at sink level. Focused on short distance functional ambulation with RW from bed to [facility] to sink with w/c, sit to stand, standing tolerance, standing balance with only one UE to work on Archivist. Used reacher for unthreading and threading pants for energy conservation and for increased independence, activity tolerance.   Therapy Documentation Precautions:  Precautions Precautions: Fall Restrictions Weight Bearing Restrictions: No General:   Vital Signs: Oxygen Therapy SpO2: 96 % O2 Device: Trach collar FiO2 (%): 28 % O2 Flow Rate (L/min): 5 L/min Pain:  no c/o pain  See FIM for current functional status  Therapy/Group: Individual Therapy  Roney Mans [facility] 07/01/2011, 10:27 AM

## 2011-07-01 NOTE — Progress Notes (Signed)
Physical Therapy Note  Patient Details  Name: BLYTHE HARTSHORN MRN: [id number] Date of Birth: 10/18/1955 Today's Date: 07/01/2011  Time: 1000-1043 43 minutes  No c/o pain.  Attempt w/c mobility with B UEs for propulsion, pt required max A, fatigued easily with UE activity.  Sit to stand training from hi lo mat with lowering surface each attempt.  Pt performed sit to stands from elevated surface with supervision, progressed to mod A with lower height.  Pt fatigues easily and requires 1-2 minute rests between sit to stand attempts.  O2 sats 94-96% throughout treatment.  Gait training 20', 10' with min A with RW.  Pt improved gait distance and transfers today.  Individual therapy     Latrel Szymczak 07/01/2011, 11:24 AM

## 2011-07-01 NOTE — Progress Notes (Signed)
Speech Language Pathology Daily Session Note  Patient Details  Name: Chad [name] MRN: [id number] Date of Birth: Apr 11, 1955  Today's Date: 07/01/2011 Time: 1330-1400 Time Calculation (min): 30 min  Short Term Goals:  SLP Short Term Goal 1 (Week 1): Patient will wear PMSV during all waking hours without any changes in VS and no assistance. SLP Short Term Goal 2 (Week 1): Patient will donn/doff PMSV as a modified independent SLP Short Term Goal 3 (Week 1): Patient will utilize airway protection compensatory strategy of a chin tuck as a modified independent.  Skilled Therapeutic Interventions: Treatment focus on PMSV education in regards to care and for donning/doffing.  Pt independently able to donn/doff PMSV with use of mirror.  Pt also verbalized understanding on how to appropriately clean his PMSV. Pt independently utilizing swallowing strategy of a chin tuck with thin liquids via cup in a distracting environment .  Tolerated PMSV throughout session without distress.    FIM:  Comprehension Comprehension: 6-Follows complex conversation/direction: With extra time/assistive device Expression Expression: 6-Expresses complex ideas: With extra time/assistive device Social Interaction Social Interaction: 6-Interacts appropriately with others with medication or extra time (anti-anxiety, antidepressant). Problem Solving Problem Solving: 7-Solves complex problems: Recognizes & self-corrects Memory Memory: 7-Complete Independence: No helper FIM - Eating Eating Activity: 5: Supervision/cues  Pain Pain Assessment Pain Assessment: No/denies pain  Therapy/Group: Individual Therapy  Chad [name] 07/01/2011, 3:38 PM

## 2011-07-02 DIAGNOSIS — R5381 Other malaise: Secondary | ICD-10-CM

## 2011-07-02 DIAGNOSIS — N19 Unspecified kidney failure: Secondary | ICD-10-CM

## 2011-07-02 LAB — RENAL FUNCTION PANEL
BUN: 67 mg/dL — ABNORMAL HIGH (ref 6–23)
Calcium: 8.4 mg/dL (ref 8.4–10.5)
Creatinine, Ser: 4.96 mg/dL — ABNORMAL HIGH (ref 0.50–1.35)
Glucose, Bld: 84 mg/dL (ref 70–99)
Phosphorus: 5.2 mg/dL — ABNORMAL HIGH (ref 2.3–4.6)

## 2011-07-02 LAB — FUNGUS CULTURE W SMEAR

## 2011-07-02 NOTE — Progress Notes (Signed)
POST OPERATIVE NOTE:  07/02/2011 Chad [name] [id number]  VITALS: BP 136/78  Pulse 80  Temp(Src) 98.1 F (36.7 C) (Oral)  Resp 18  Ht 6' (1.829 m)  Wt 260 lb 2.3 oz (118 kg)  BMI 35.28 kg/m2  SpO2 100%  Patient is status post surgical extraction of tooth #14 with alveoloplasty.  SUBJECTIVE: Patient denies any acute dental pain or active bleeding. Sutures are still present.  EXAM: There is no sign of infection, heme, or ooze. Sutures are intact. Clot is present.  ASSESSMENT: Post operative course is consistent with dental procedures performed in the operating room.  PLAN: 1. Continue salt water rinses every 2 hours while awake.  2. Continue chlorhexidine rinses as prescribed. 3. Sutures will dissolve on their own. 4. Followup with his primary dentist for exam and cleaning once medically stable. 5. Call if acute problems arise remainder of this admission   [name], DDS

## 2011-07-02 NOTE — Progress Notes (Signed)
Occupational Therapy Session Note  Patient Details  Name: Chad [name] MRN: [id number] Date of Birth: 12-20-1955  Today's Date: 07/02/2011 Time: 0830-0930 Time Calculation (min): 60 min  Short Term Goals: Week 1:  OT Short Term Goal 1 (Week 1): Patient will complete bathing, seated, in walk-in shower with min assist OT Short Term Goal 2 (Week 1): Patient will complete transfers bed/toilet/w/c with min assist OT Short Term Goal 3 (Week 1): Patient will demo improved functional endurance as evidenced by ability to complete bathing/dressing/grooming, seated and standing in 60 min. OT Short Term Goal 4 (Week 1): Patient will demo ability to use assistive devices, prn, with min assist  to complete lower body bathing and dressing OT Short Term Goal 5 (Week 1): Patient will demonstrate understanding of 3 prinicples of energy conservation during BADL.  Skilled Therapeutic Interventions/Progress Updates:    1:1 self care retraining at sink level: including functional ambulation from bed to toilet in bathroom with 3:1 over toilet; sit to stand with mod A but then able to ambulate with steadying A with extra time.BAthed LB sit to stand at sink with mod A to stand, able to don socks without sock aide with rest breaks every couple of minutes- when sock got stuck on toe nail pt needed a to fix sock 2ndary to his fatigue. Declined shoes today. Goal to work towards get out his own clothing.  Therapy Documentation Precautions:  Precautions Precautions: Fall Restrictions Weight Bearing Restrictions: No Vital Signs: Therapy Vitals Temp: 98.1 F (36.7 C) Temp src: Oral Pulse Rate: 80  Resp: 18  BP: 136/78 mmHg Patient Position, if appropriate: Lying Oxygen Therapy SpO2: 100 % O2 Device: None (Room air) FiO2 (%): 21 % Pain:  no c/o pain  See FIM for current functional status  Therapy/Group: Individual Therapy  Roney Mans [facility] 07/02/2011, 9:23 AM

## 2011-07-02 NOTE — Patient Care Conference (Signed)
Inpatient RehabilitationTeam Conference Note Date: 07/01/2011   Time: 10:50 AM    Patient Name: Chad [name]      Medical Record Number: [id number]  Date of Birth: [birth year] Sex: Male         Room/Bed: 4037/4037-01 Payor Info: Payor: BLUE CROSS BLUE SHIELD  Plan: BCBS PPO OUT OF STATE  Product Type: *No Product type*     Admitting Diagnosis: CRITICAL ILLNESS MYOPATHY TRACH DIALYSIS   Admit Date/Time:  06/27/2011  5:14 PM Admission Comments: No comment available   Primary Diagnosis:  Critical illness myopathy Principal Problem: Critical illness myopathy  Patient Active Problem List  Diagnoses Date Noted  . 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 Septic shock 06/10/2011  . Acute renal failure 06/10/2011  . Cardiomyopathy 06/10/2011  . Acute systolic heart failure 06/09/2011  . Renal transplant disorder 06/05/2011  . HCAP (healthcare-associated pneumonia) 06/04/2011  . Thrombocytopenia 06/04/2011  . Acute respiratory failure 06/04/2011  . ARDS (adult respiratory distress syndrome) 06/04/2011    Expected Discharge Date: Expected Discharge Date: 07/18/11  Team Members Present: Physician: Dr. Faith Rogue Case Manager Present: Melanee Spry, RN Social Worker Present: Amada Jupiter, LCSW Nurse Present: Carmie End, RN PT Present: Reggy Eye, PT OT Present: Roney Mans, Felipa Eth, OT SLP Present: Feliberto Gottron, SLP     Current Status/Progress Goal Weekly Team Focus  Medical   deconditioining from AVDRF and critical illnes myopathy  managment of pulmonar and renal issues to allow increased activity tolerance  trach downsize and ? out.  foley out and voiding trial   Bowel/Bladder   Continent of bowel, LBM 6/2; foley intact  Continent of bowel; manage bladder      Swallow/Nutrition/ Hydration   Regular, thin liquids: supervision A for utilization of swallowing compensatory strategies  Mod I  increased utilization of strategies, MBSS next week   ADL's   UB  supervision, LB  mod I   activity tolerance, sit to stand, standing balance and tolerance   Mobility   mod - max A  mod I  activity tolerance, strengthening   Communication   Total A for donning/doffing PMSV; however, Mod I for functional communication  Mod I for care of PMSV  donn/doff PMSV, appropriately cleaning PMSV   Safety/Cognition/ Behavioral Observations            Pain   Denies  </=2      Skin   Left calf wound with santyl/allevyn dressing; BLE pitting edema  No new skin breakdown         *See Interdisciplinary Assessment and Plan and progress notes for long and short-term goals  Barriers to Discharge: profound weakness, especially in the prox le    Possible Resolutions to Barriers:  strength and endurance training, pacing    Discharge Planning/Teaching Needs:  Home with girlfriend, son and friends available to provide 24/7 assistance if needed.      Team Discussion: Doing well in txs--does take him awhile to do an activity-fatigues.  To d/c foley & trach [name] is plugged] soon.   Revisions to Treatment Plan: none    Continued Need for Acute Rehabilitation Level of Care: The patient requires daily medical management by a physician with specialized training in physical medicine and rehabilitation for the following conditions: Daily direction of a multidisciplinary physical rehabilitation program to ensure safe treatment while eliciting the highest outcome that is of practical value to the patient.: Yes Daily medical management of patient stability for increased activity during participation in an intensive  rehabilitation regime.: Yes Daily analysis of laboratory values and/or radiology reports with any subsequent need for medication adjustment of medical intervention for : Pulmonary problems;Cardiac problems;Other;Neurological problems  [name] 07/02/2011, 5:03 PM

## 2011-07-02 NOTE — Progress Notes (Signed)
Physical Therapy Note  Patient Details  Name: Chad [name] MRN: [id number] Date of Birth: [birth year] Today's Date: 07/02/2011  Time: [phone] 55 minutes  No c/o pain.  Sit to stand training from hi lo mat with pt performing with supervision from all descending heights, improved sit to stands from mat surface, continues to be min-mod A from w/c.  Standing ball toss 3 x 3-4 minutes with focus on LE endurance, UE ROM with ball toss activity.  Gait training with RW 20', 8' with close supervision.   Pt with improving LE strength and endurance.  Individual therapy    [name] 07/02/2011, 3:53 PM

## 2011-07-02 NOTE — Progress Notes (Signed)
Occupational Therapy Session Note  Patient Details  Name: Chad [name] MRN: [id number] Date of Birth: September 14, 1955  Today's Date: 07/02/2011 Time: 1130-1200 Time Calculation (min): 30 min  Short Term Goals: Week 1:  OT Short Term Goal 1 (Week 1): Patient will complete bathing, seated, in walk-in shower with min assist OT Short Term Goal 2 (Week 1): Patient will complete transfers bed/toilet/w/c with min assist OT Short Term Goal 3 (Week 1): Patient will demo improved functional endurance as evidenced by ability to complete bathing/dressing/grooming, seated and standing in 60 min. OT Short Term Goal 4 (Week 1): Patient will demo ability to use assistive devices, prn, with min assist  to complete lower body bathing and dressing OT Short Term Goal 5 (Week 1): Patient will demonstrate understanding of 3 prinicples of energy conservation during BADL.  Skilled Therapeutic Interventions/Progress Updates:    1:1 focus on problem solving home management while using RW with walker bag. Shower stall transfer into simulated shower stall stepping backwards into it and using a standard shower chair (with steadying A to step over, mod for sit to stands)  Therapy Documentation Precautions:  Precautions Precautions: Fall Restrictions Weight Bearing Restrictions: No Vital Signs: Therapy Vitals Pulse Rate: 78  Resp: 18  Patient Position, if appropriate: Sitting Oxygen Therapy SpO2: 99 % O2 Device: None (Room air) Pain:  no c/o pain  See FIM for current functional status  Therapy/Group: Individual Therapy  Roney Mans [facility] 07/02/2011, 2:04 PM

## 2011-07-02 NOTE — Progress Notes (Addendum)
Patient ID: Chad [name], male   DOB: 1955-10-18, 56 y.o.   MRN: [id number] Subjective/Complaints: Good day yesterday. Still feels weak in legs.       Review of Systems  Respiratory: Negative for cough and shortness of breath.   Cardiovascular: Positive for leg swelling.       Chronic dorsum of L foot  Gastrointestinal: Negative.   Genitourinary:       Good output  Neurological: Positive for focal weakness and weakness.       Both legs weak  All other systems reviewed and are negative.    Objective: Vital Signs: Blood pressure 136/78, pulse 80, temperature 98.1 F (36.7 C), temperature source Oral, resp. rate 18, height 6' (1.829 m), weight 118 kg (260 lb 2.3 oz), SpO2 100.00%. No results found. Results for orders placed during the hospital encounter of 06/27/11 (from the past 72 hour(s))  CBC     Status: Abnormal   Collection Time   06/30/11  6:25 AM      Component Value Range Comment   WBC 7.4  4.0 - 10.5 (K/uL)    RBC 2.51 (*) 4.22 - 5.81 (MIL/uL)    Hemoglobin 8.4 (*) 13.0 - 17.0 (g/dL)    HCT 08.6 (*) 57.8 - 52.0 (%)    MCV 105.6 (*) 78.0 - 100.0 (fL)    MCH 33.5  26.0 - 34.0 (pg)    MCHC 31.7  30.0 - 36.0 (g/dL)    RDW 46.9 (*) 62.9 - 15.5 (%)    Platelets 195  150 - 400 (K/uL)   DIFFERENTIAL     Status: Abnormal   Collection Time   06/30/11  6:25 AM      Component Value Range Comment   Neutrophils Relative 56  43 - 77 (%)    Neutro Abs 4.2  1.7 - 7.7 (K/uL)    Lymphocytes Relative 27  12 - 46 (%)    Lymphs Abs 2.0  0.7 - 4.0 (K/uL)    Monocytes Relative 16 (*) 3 - 12 (%)    Monocytes Absolute 1.2 (*) 0.1 - 1.0 (K/uL)    Eosinophils Relative 1  0 - 5 (%)    Eosinophils Absolute 0.1  0.0 - 0.7 (K/uL)    Basophils Relative 1  0 - 1 (%)    Basophils Absolute 0.0  0.0 - 0.1 (K/uL)   RENAL FUNCTION PANEL     Status: Abnormal   Collection Time   06/30/11  6:35 AM      Component Value Range Comment   Sodium 131 (*) 135 - 145 (mEq/L)    Potassium 3.9  3.5 - 5.1 (mEq/L)     Chloride 96  96 - 112 (mEq/L)    CO2 21  19 - 32 (mEq/L)    Glucose, Bld 87  70 - 99 (mg/dL)    BUN 59 (*) 6 - 23 (mg/dL)    Creatinine, Ser 5.28 (*) 0.50 - 1.35 (mg/dL)    Calcium 8.1 (*) 8.4 - 10.5 (mg/dL)    Phosphorus 4.3  2.3 - 4.6 (mg/dL)    Albumin 1.9 (*) 3.5 - 5.2 (g/dL)    GFR calc non Af Amer 13 (*) >90 (mL/min)    GFR calc Af Amer 16 (*) >90 (mL/min)   RENAL FUNCTION PANEL     Status: Abnormal   Collection Time   07/01/11  6:50 AM      Component Value Range Comment   Sodium 132 (*) 135 - 145 (mEq/L)  Potassium 4.0  3.5 - 5.1 (mEq/L)    Chloride 95 (*) 96 - 112 (mEq/L)    CO2 22  19 - 32 (mEq/L)    Glucose, Bld 72  70 - 99 (mg/dL)    BUN 63 (*) 6 - 23 (mg/dL)    Creatinine, Ser 1.61 (*) 0.50 - 1.35 (mg/dL)    Calcium 8.5  8.4 - 10.5 (mg/dL)    Phosphorus 4.8 (*) 2.3 - 4.6 (mg/dL)    Albumin 2.0 (*) 3.5 - 5.2 (g/dL)    GFR calc non Af Amer 13 (*) >90 (mL/min)    GFR calc Af Amer 15 (*) >90 (mL/min)   RENAL FUNCTION PANEL     Status: Abnormal   Collection Time   07/02/11  6:45 AM      Component Value Range Comment   Sodium 131 (*) 135 - 145 (mEq/L)    Potassium 3.6  3.5 - 5.1 (mEq/L)    Chloride 96  96 - 112 (mEq/L)    CO2 21  19 - 32 (mEq/L)    Glucose, Bld 84  70 - 99 (mg/dL)    BUN 67 (*) 6 - 23 (mg/dL)    Creatinine, Ser 0.96 (*) 0.50 - 1.35 (mg/dL)    Calcium 8.4  8.4 - 10.5 (mg/dL)    Phosphorus 5.2 (*) 2.3 - 4.6 (mg/dL)    Albumin 2.1 (*) 3.5 - 5.2 (g/dL)    GFR calc non Af Amer 12 (*) >90 (mL/min)    GFR calc Af Amer 14 (*) >90 (mL/min)     HEENT: normal Cardio: RRR Resp: CTA B/L GI: BS positive Extremity:  Edema dorsum L foot Skin:   Intact Neuro: Alert/Oriented, Normal Sensory and Abnormal Motor 3-/5 B Hip flexor and Knee extensor Musc/Skel:  Normal Edema 2+ on either limb. Wound on back of left calf with decreased fibronecrotic material.  #4 trach--plugged. No distress. Chest clear.   Assessment/Plan: 1. Functional deficits secondary to  critical illness myopathy which require 3+ hours per day of interdisciplinary therapy in a comprehensive inpatient rehab setting. Physiatrist is providing close team supervision and 24 hour management of active medical problems listed below. Physiatrist and rehab team continue to assess barriers to discharge/monitor patient progress toward functional and medical goals. FIM: FIM - Bathing Bathing Steps Patient Completed: Chest;Right Arm;Left Arm;Abdomen;Front perineal area;Left upper leg;Right upper leg Bathing: 3: Mod-Patient completes 5-7 51f 10 parts or 50-74%  FIM - Upper Body Dressing/Undressing Upper body dressing/undressing steps patient completed: Thread/unthread right sleeve of pullover shirt/dresss;Thread/unthread left sleeve of pullover shirt/dress;Put head through opening of pull over shirt/dress;Pull shirt over trunk Upper body dressing/undressing: 5: Set-up assist to: Obtain clothing/put away FIM - Lower Body Dressing/Undressing Lower body dressing/undressing steps patient completed: Thread/unthread right underwear leg;Thread/unthread right pants leg Lower body dressing/undressing: 2: Max-Patient completed 25-49% of tasks  FIM - Toileting Toileting: 1: Total-Patient completed zero steps, helper did all 3  FIM - Toilet Transfers Toilet Transfers: 3-To toilet/BSC: Mod A (lift or lower assist);3-From toilet/BSC: Mod A (lift or lower assist)  FIM - Press photographer Assistive Devices: Bed rails;HOB elevated;Walker Bed/Chair Transfer: 3: Bed > Chair or W/C: Mod A (lift or lower assist);3: Chair or W/C > Bed: Mod A (lift or lower assist)  FIM - Locomotion: Wheelchair Distance: 20' Locomotion: Wheelchair: 1: Travels less than 50 ft with maximal assistance (Pt: 25 - 49%) FIM - Locomotion: Ambulation Locomotion: Ambulation Assistive Devices: Designer, industrial/product Ambulation/Gait Assistance: 4: Min guard Locomotion: Ambulation: 1:  Travels less than 50 ft with minimal  assistance (Pt.>75%)  Comprehension Comprehension Mode: Auditory Comprehension: 6-Follows complex conversation/direction: With extra time/assistive device  Expression Expression Mode: Verbal Expression Assistive Devices: 6-Talk trach valve Expression: 6-Expresses complex ideas: With extra time/assistive device  Social Interaction Social Interaction: 6-Interacts appropriately with others with medication or extra time (anti-anxiety, antidepressant).  Problem Solving Problem Solving: 7-Solves complex problems: Recognizes & self-corrects  Memory Memory: 7-Complete Independence: No helper  Medical Problem List and Plan:  1. DVT Prophylaxis/Anticoagulation: Mechanical: Sequential compression devices, below knee Bilateral lower extremities-gait 2. Pain Management: Continue hydrocodone prn  3. Mood: Monitor for now. Seems very motivated.  4. Renal transplant with chronic allograft nephropathy: continue HD on prn basis per nephrology. Fluid overload improving. Strict I and O. Continue prednisone and sirolimus. Po lasix initiated. Would like to dc foley, but i's and o's are being tracked closely by nephrology. Will continue it for the time being. 5. Left calf ulcer- continue collagegase dressing- area needs to be packed 6. Afib: Continue amiodarone. ?coumadin per cardiology.  7. Anemia of chronic disease:  Continue aranesp weekly. 8. Pulmonary toilet- downsized trach to #4 without issues.  Plugging trial- if he does well, i would like to dc tomorrow.   LOS (Days) 5 A FACE TO FACE EVALUATION WAS PERFORMED  Enes Wegener T 07/02/2011, 8:00 AM

## 2011-07-02 NOTE — Progress Notes (Signed)
Physical Therapy Note  Patient Details  Name: Chad [name] MRN: [id number] Date of Birth: February 24, 1955 Today's Date: 07/02/2011  Time: 1345-1430 45 minutes  No c/o pain.  Stair climbing x 3 stairs with + 2 assist for safety, pt 85%.  Pt required increased time and rest break with each step but able to perform with min A.  Squat pivot transfer to nu step with mod A, pt with increased fatigue from stair climbing.  Nu step for LE strength and endurance x 10 minutes, level 4 with 1 rest break.  Pt continues to improve strength and activity tolerance daily.  Individual therapy   Noela Brothers 07/02/2011, 3:41 PM

## 2011-07-02 NOTE — Progress Notes (Signed)
Speech Language Pathology Daily Session Note  Patient Details  Name: Chad [name] MRN: [id number] Date of Birth: 04/05/1955  Today's Date: 07/02/2011 Time: 2952-8413 Time Calculation (min): 25 min  Short Term Goals: Week 1: SLP Short Term Goal 1 (Week 1): Patient will wear PMSV during all waking hours without any changes in VS and no assistance. SLP Short Term Goal 2 (Week 1): Patient will donn/doff PMSV as a modified independent SLP Short Term Goal 3 (Week 1): Patient will utilize airway protection compensatory strategy of a chin tuck as a modified independent.  Skilled Therapeutic Interventions: Treatment focus on utilization of swallowing compensatory strategy of a chin tuck with thin liquids via cup. Pt independently utilized strategy in distracting environment. Pt's trach plugged today and pt demonstrated efficient phonation and breath support for functional communication at the conversation level.    FIM:  Comprehension Comprehension Mode: Auditory Comprehension: 7-Follows complex conversation/direction: With no assist Expression Expression Mode: Verbal Expression: 7-Expresses complex ideas: With no assist Social Interaction Social Interaction: 7-Interacts appropriately with others - No medications needed. Problem Solving Problem Solving: 7-Solves complex problems: Recognizes & self-corrects Memory Memory: 7-Complete Independence: No helper FIM - Eating Eating Activity: 5: Supervision/cues  Pain Pain Assessment Pain Assessment: No/denies pain  Therapy/Group: Individual Therapy  Jocelyn Nold 07/02/2011, 4:15 PM

## 2011-07-02 NOTE — Care Management Note (Signed)
Patient ID: Chad [name], male   DOB: 1955/08/04, 56 y.o.   MRN: [id number] [name], pt was given team conf report:  ELOS 07/18/11  Modified Independent goals.  He is participating well in txs.  Update faxed to [facility] [place] {Pam Mulligan].

## 2011-07-02 NOTE — Progress Notes (Signed)
Recreational Therapy Session Note  Patient Details  Name: AGUSTIN SWATEK MRN: [id number] Date of Birth: April 18, 1955 Today's Date: 07/02/2011 Time:   10-1028 Pain: no c/o Skilled Therapeutic Interventions/Progress Updates: Pt participated in standing ball toss 3 x 3-4 minutes with focus on LE endurance, UE ROM with ball toss activity. Gait training with RW 20', 12' with close supervision. Pt with improving LE strength and endurance  Deiona Hooper 07/02/2011, 5:16 PM

## 2011-07-02 NOTE — Progress Notes (Signed)
S:eating well  Working with PT. No SOB  Foley still in O:BP 136/78  Pulse 80  Temp(Src) 98.1 F (36.7 C) (Oral)  Resp 18  Ht 6' (1.829 m)  Wt 118 kg (260 lb 2.3 oz)  BMI 35.28 kg/m2  SpO2 100%  Intake/Output Summary (Last 24 hours) at 07/02/11 0815 Last data filed at 07/01/11 1446  Gross per 24 hour  Intake    480 ml  Output      0 ml  Net    480 ml   Weight change:  ZOX:WRUEA and alert CVS:RRR Resp:Clear Abd:+ BS NTND Ext: 3+ edema, improving NEURO:CNI Ox3 Rt perm cath      . amiodarone  200 mg Oral Daily  . antiseptic oral rinse  15 mL Mouth Rinse QID  . chlorhexidine  15 mL Mouth Rinse BID  . collagenase   Topical Daily  . darbepoetin (ARANESP) injection - DIALYSIS  100 mcg Intravenous Q Thu-HD  . furosemide  80 mg Oral BID  . pantoprazole  40 mg Oral BID AC  . predniSONE  10 mg Oral Q breakfast  . sirolimus  0.75 mg Oral Daily   No results found. BMET    Component Value Date/Time   NA 131* 07/02/2011 0645   K 3.6 07/02/2011 0645   CL 96 07/02/2011 0645   CO2 21 07/02/2011 0645   GLUCOSE 84 07/02/2011 0645   BUN 67* 07/02/2011 0645   CREATININE 4.96* 07/02/2011 0645   CALCIUM 8.4 07/02/2011 0645   GFRNONAA 12* 07/02/2011 0645   GFRAA 14* 07/02/2011 0645   CBC    Component Value Date/Time   WBC 7.4 06/30/2011 0625   RBC 2.51* 06/30/2011 0625   HGB 8.4* 06/30/2011 0625   HCT 26.5* 06/30/2011 0625   PLT 195 06/30/2011 0625   MCV 105.6* 06/30/2011 0625   MCH 33.5 06/30/2011 0625   MCHC 31.7 06/30/2011 0625   RDW 20.6* 06/30/2011 0625   LYMPHSABS 2.0 06/30/2011 0625   MONOABS 1.2* 06/30/2011 0625   EOSABS 0.1 06/30/2011 0625   BASOSABS 0.0 06/30/2011 0625     Assessment: 1. 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 PNA, resolving 3. Anemia 4.  Sec HPTH  Off rocaltrol for now Plan: 1. Cont to follow Scr 2.  No HD today 3. DC foley and keep I/O's 4. Cont diuresis 5. Check iron studies  Chad [name]

## 2011-07-03 LAB — RENAL FUNCTION PANEL
BUN: 68 mg/dL — ABNORMAL HIGH (ref 6–23)
CO2: 21 mEq/L (ref 19–32)
Calcium: 8.1 mg/dL — ABNORMAL LOW (ref 8.4–10.5)
Creatinine, Ser: 4.87 mg/dL — ABNORMAL HIGH (ref 0.50–1.35)
GFR calc Af Amer: 14 mL/min — ABNORMAL LOW (ref >90)
Glucose, Bld: 83 mg/dL (ref 70–99)
Sodium: 131 mEq/L — ABNORMAL LOW (ref 135–145)

## 2011-07-03 LAB — CBC
HCT: 26.3 % — ABNORMAL LOW (ref 39.0–52.0)
Hemoglobin: 8.3 g/dL — ABNORMAL LOW (ref 13.0–17.0)
MCHC: 31.6 g/dL (ref 30.0–36.0)

## 2011-07-03 LAB — IRON AND TIBC: TIBC: 164 ug/dL — ABNORMAL LOW (ref 215–435)

## 2011-07-03 LAB — FERRITIN: Ferritin: 1309 ng/mL — ABNORMAL HIGH (ref 22–322)

## 2011-07-03 MED ORDER — DARBEPOETIN ALFA-POLYSORBATE 100 MCG/0.5ML IJ SOLN
100.0000 ug | INTRAMUSCULAR | Status: DC
  Administered 2011-07-03 – 2011-07-10 (×2): 100 ug via SUBCUTANEOUS
  Filled 2011-07-03: qty 0.5

## 2011-07-03 MED ORDER — CALCITRIOL 0.25 MCG PO CAPS
0.2500 ug | ORAL_CAPSULE | Freq: Every day | ORAL | Status: DC
  Administered 2011-07-03 – 2011-07-16 (×14): 0.25 ug via ORAL
  Filled 2011-07-03 (×15): qty 1

## 2011-07-03 NOTE — Progress Notes (Signed)
Recreational Therapy Session Note  Patient Details  Name: Chad [name] MRN: [id number] Date of Birth: 1955/06/19 Today's Date: 07/03/2011 Time:  10-1028 Pain: no c/o Skilled Therapeutic Interventions/Progress Updates: Pt stood with RW to kick a ball alternating feet tow work on activity tolerance, standing balance.  Pt able to stand x 8 mins, 5 mins with close supervision. Pt required more frequent, prolonged rests in standing and sitting this session, increased fatigue.  Therapy/Group: Co-Treatment  Activity Level: Moderate:  Level of assist: Supervision-close  Ophia Shamoon 07/03/2011, 12:13 PM

## 2011-07-03 NOTE — Progress Notes (Signed)
S:eating well  Working with PT. No SOB  Urinating OK O:BP 121/74  Pulse 82  Temp(Src) 97.5 F (36.4 C) (Oral)  Resp 18  Ht 6' (1.829 m)  Wt 112.6 kg (248 lb 3.8 oz)  BMI 33.67 kg/m2  SpO2 98%  Intake/Output Summary (Last 24 hours) at 07/03/11 0828 Last data filed at 07/03/11 0644  Gross per 24 hour  Intake    240 ml  Output   2045 ml  Net  -1805 ml   Weight change:  UVO:ZDGUY and alert CVS:RRR Resp:Clear Abd:+ BS NTND Ext: 2-3+ edema, improving NEURO:CNI Ox3 Rt perm cath      . amiodarone  200 mg Oral Daily  . antiseptic oral rinse  15 mL Mouth Rinse QID  . chlorhexidine  15 mL Mouth Rinse BID  . collagenase   Topical Daily  . darbepoetin (ARANESP) injection - DIALYSIS  100 mcg Intravenous Q Thu-HD  . furosemide  80 mg Oral BID  . pantoprazole  40 mg Oral BID AC  . predniSONE  10 mg Oral Q breakfast  . sirolimus  0.75 mg Oral Daily   No results found. BMET    Component Value Date/Time   NA 131* 07/03/2011 0630   K 3.5 07/03/2011 0630   CL 95* 07/03/2011 0630   CO2 21 07/03/2011 0630   GLUCOSE 83 07/03/2011 0630   BUN 68* 07/03/2011 0630   CREATININE 4.87* 07/03/2011 0630   CALCIUM 8.1* 07/03/2011 0630   GFRNONAA 12* 07/03/2011 0630   GFRAA 14* 07/03/2011 0630   CBC    Component Value Date/Time   WBC 10.2 07/03/2011 0630   RBC 2.56* 07/03/2011 0630   HGB 8.3* 07/03/2011 0630   HCT 26.3* 07/03/2011 0630   PLT 201 07/03/2011 0630   MCV 102.7* 07/03/2011 0630   MCH 32.4 07/03/2011 0630   MCHC 31.6 07/03/2011 0630   RDW 18.8* 07/03/2011 0630   LYMPHSABS 2.0 06/30/2011 0625   MONOABS 1.2* 06/30/2011 0625   EOSABS 0.1 06/30/2011 0625   BASOSABS 0.0 06/30/2011 0625     Assessment: 1. 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 PNA, resolving 3. Anemia 4.  Sec HPTH  Off rocaltrol for now Plan: 1. Cont to follow Scr 2.  No HD today 3. Await sirolimus level 4. Resume rocaltrol  Karsen Fellows T

## 2011-07-03 NOTE — Procedures (Signed)
Tracheostomy Change Note  Patient Details:   Name: Chad [name] DOB: Jun 02, 1955 MRN: [id number]    Airway Documentation: Airway (Active)   pt decannulated and gauze dressing applied.  Airway (Active)     Evaluation  O2 sats: stable throughout Complications: No apparent complications Patient did tolerate procedure well. Bilateral Breath Sounds: Clear;Diminished    Clearance Coots 07/03/2011, 11:11 AM

## 2011-07-03 NOTE — Progress Notes (Signed)
Occupational Therapy Weekly Progress Note  Patient Details  Name: Chad [name] MRN: [id number] Date of Birth: [birth year]  Today's Date: 07/03/2011 Time: 0830-0930 Time Calculation (min): 60 min  Patient has met 3 of 5 short term goals.  Pt continues to make good progress this week in OT. Pt bathes at sink with min A for feet and can dress with min A, however depending on fatigue level pt's ability to perform sit to stand with mod to total A. Pt with low activity tolerance needing frequent rest breaks with activity. Pt's trach was decanulated and has not receive HD since CIR admission.    Patient continues to demonstrate the following deficits: muscle weakness, decreased cardiorespiratoy endurance and decreased standing balance, decreased postural control and decreased balance strategies and therefore will continue to benefit from skilled OT intervention to enhance overall performance with BADL.  Patient progressing toward long term goals..  Continue plan of care.  OT Short Term Goals Week 1:  OT Short Term Goal 1 (Week 1): Patient will complete bathing, seated, in walk-in shower with min assist OT Short Term Goal 1 - Progress (Week 1): Met OT Short Term Goal 2 (Week 1): Patient will complete transfers bed/toilet/w/c with min assist OT Short Term Goal 2 - Progress (Week 1): Not met OT Short Term Goal 3 (Week 1): Patient will demo improved functional endurance as evidenced by ability to complete bathing/dressing/grooming, seated and standing in 60 min. OT Short Term Goal 3 - Progress (Week 1): Not met OT Short Term Goal 4 (Week 1): Patient will demo ability to use assistive devices, prn, with min assist  to complete lower body bathing and dressing OT Short Term Goal 4 - Progress (Week 1): Met OT Short Term Goal 5 (Week 1): Patient will demonstrate understanding of 3 prinicples of energy conservation during BADL. OT Short Term Goal 5 - Progress (Week 1): Met Week 2:  OT Short Term Goal 1  (Week 2): Pt will perform bed mobility mod I in prep for ADL OT Short Term Goal 2 (Week 2): Pt will perform toilet transfers with min A  OT Short Term Goal 3 (Week 2): Pt will dress LB sit to stand with close supervision  OT Short Term Goal 4 (Week 2): Pt will obtain clothing from drawers with RW in standing with supervision   Skilled Therapeutic Interventions/Progress Updates:    same  Therapy Documentation Precautions:  Precautions Precautions: Fall Restrictions Weight Bearing Restrictions: No Pain:  no c/o     Other Treatments:    See FIM for current functional status  Therapy/Group: Individual Therapy  Roney Mans [facility] 07/03/2011, 3:04 PM

## 2011-07-03 NOTE — Progress Notes (Signed)
Pts sat s remain at 93-98% on room air; occlusive dressing at trach site. 3 low volume scans per report; Vding QS; no c/o pain.  Wound redressed, PAC saw. Carlean Purl

## 2011-07-03 NOTE — Progress Notes (Signed)
Patient ID: Chad [name], male   DOB: May 27, 1955, 56 y.o.   MRN: [id number] Patient ID: Chad [name], male   DOB: 07-May-1955, 56 y.o.   MRN: [id number] Subjective/Complaints: Good day again yesterday. Making more urine.  Trach plugged with good sats.       Review of Systems  Respiratory: Negative for cough and shortness of breath.   Cardiovascular: Positive for leg swelling.       Chronic dorsum of L foot  Gastrointestinal: Negative.   Genitourinary:       Good output  Neurological: Positive for focal weakness and weakness.       Both legs weak  All other systems reviewed and are negative.    Objective: Vital Signs: Blood pressure 121/74, pulse 82, temperature 97.5 F (36.4 C), temperature source Oral, resp. rate 18, height 6' (1.829 m), weight 112.311 kg (247 lb 9.6 oz), SpO2 98.00%. No results found. Results for orders placed during the hospital encounter of 06/27/11 (from the past 72 hour(s))  RENAL FUNCTION PANEL     Status: Abnormal   Collection Time   06/30/11  6:35 AM      Component Value Range Comment   Sodium 131 (*) 135 - 145 (mEq/L)    Potassium 3.9  3.5 - 5.1 (mEq/L)    Chloride 96  96 - 112 (mEq/L)    CO2 21  19 - 32 (mEq/L)    Glucose, Bld 87  70 - 99 (mg/dL)    BUN 59 (*) 6 - 23 (mg/dL)    Creatinine, Ser 7.82 (*) 0.50 - 1.35 (mg/dL)    Calcium 8.1 (*) 8.4 - 10.5 (mg/dL)    Phosphorus 4.3  2.3 - 4.6 (mg/dL)    Albumin 1.9 (*) 3.5 - 5.2 (g/dL)    GFR calc non Af Amer 13 (*) >90 (mL/min)    GFR calc Af Amer 16 (*) >90 (mL/min)   RENAL FUNCTION PANEL     Status: Abnormal   Collection Time   07/01/11  6:50 AM      Component Value Range Comment   Sodium 132 (*) 135 - 145 (mEq/L)    Potassium 4.0  3.5 - 5.1 (mEq/L)    Chloride 95 (*) 96 - 112 (mEq/L)    CO2 22  19 - 32 (mEq/L)    Glucose, Bld 72  70 - 99 (mg/dL)    BUN 63 (*) 6 - 23 (mg/dL)    Creatinine, Ser 9.56 (*) 0.50 - 1.35 (mg/dL)    Calcium 8.5  8.4 - 10.5 (mg/dL)    Phosphorus 4.8 (*) 2.3 - 4.6  (mg/dL)    Albumin 2.0 (*) 3.5 - 5.2 (g/dL)    GFR calc non Af Amer 13 (*) >90 (mL/min)    GFR calc Af Amer 15 (*) >90 (mL/min)   RENAL FUNCTION PANEL     Status: Abnormal   Collection Time   07/02/11  6:45 AM      Component Value Range Comment   Sodium 131 (*) 135 - 145 (mEq/L)    Potassium 3.6  3.5 - 5.1 (mEq/L)    Chloride 96  96 - 112 (mEq/L)    CO2 21  19 - 32 (mEq/L)    Glucose, Bld 84  70 - 99 (mg/dL)    BUN 67 (*) 6 - 23 (mg/dL)    Creatinine, Ser 2.13 (*) 0.50 - 1.35 (mg/dL)    Calcium 8.4  8.4 - 10.5 (mg/dL)    Phosphorus 5.2 (*) 2.3 -  4.6 (mg/dL)    Albumin 2.1 (*) 3.5 - 5.2 (g/dL)    GFR calc non Af Amer 12 (*) >90 (mL/min)    GFR calc Af Amer 14 (*) >90 (mL/min)     HEENT: normal Cardio: RRR Resp: CTA B/L GI: BS positive Extremity:  Edema dorsum L foot Skin:   Intact Neuro: Alert/Oriented, Normal Sensory and Abnormal Motor 3-/5 B Hip flexor and Knee extensor Musc/Skel:  Normal Edema 2+ on either limb. Wound on back of left calf with decreased fibronecrotic material.  #4 trach--plugged. No distress. Chest remains clear.   Assessment/Plan: 1. Functional deficits secondary to critical illness myopathy which require 3+ hours per day of interdisciplinary therapy in a comprehensive inpatient rehab setting. Physiatrist is providing close team supervision and 24 hour management of active medical problems listed below. Physiatrist and rehab team continue to assess barriers to discharge/monitor patient progress toward functional and medical goals. FIM: FIM - Bathing Bathing Steps Patient Completed: Chest;Right Arm;Right upper leg;Left upper leg;Left Arm;Abdomen;Front perineal area;Buttocks Bathing: 4: Min-Patient completes 8-9 18f 10 parts or 75+ percent  FIM - Upper Body Dressing/Undressing Upper body dressing/undressing steps patient completed: Thread/unthread right sleeve of pullover shirt/dresss;Thread/unthread left sleeve of pullover shirt/dress;Put head through opening  of pull over shirt/dress;Pull shirt over trunk Upper body dressing/undressing: 5: Set-up assist to: Obtain clothing/put away FIM - Lower Body Dressing/Undressing Lower body dressing/undressing steps patient completed: Thread/unthread right underwear leg;Thread/unthread left underwear leg;Pull underwear up/down;Thread/unthread right pants leg;Pull pants up/down;Don/Doff right sock Lower body dressing/undressing: 4: Min-Patient completed 75 plus % of tasks  FIM - Toileting Toileting steps completed by patient: Adjust clothing prior to toileting;Performs perineal hygiene Toileting: 3: Mod-Patient completed 2 of 3 steps  FIM - Diplomatic Services operational officer Devices: Elevated toilet seat;Walker Toilet Transfers: 3-To toilet/BSC: Mod A (lift or lower assist);4-From toilet/BSC: Min A (steadying Pt. > 75%)  FIM - Bed/Chair Transfer Bed/Chair Transfer Assistive Devices: Bed rails;HOB elevated;Walker Bed/Chair Transfer: 3: Bed > Chair or W/C: Mod A (lift or lower assist);3: Chair or W/C > Bed: Mod A (lift or lower assist)  FIM - Locomotion: Wheelchair Distance: 20' Locomotion: Wheelchair: 1: Total Assistance/staff pushes wheelchair (Pt<25%) FIM - Locomotion: Ambulation Locomotion: Ambulation Assistive Devices: Designer, industrial/product Ambulation/Gait Assistance: 4: Min guard Locomotion: Ambulation: 2: Travels 50 - 149 ft with minimal assistance (Pt.>75%)  Comprehension Comprehension Mode: Auditory Comprehension: 7-Follows complex conversation/direction: With no assist  Expression Expression Mode: Verbal Expression Assistive Devices: 6-Talk trach valve Expression: 6-Expresses complex ideas: With extra time/assistive device  Social Interaction Social Interaction: 7-Interacts appropriately with others - No medications needed.  Problem Solving Problem Solving: 7-Solves complex problems: Recognizes & self-corrects  Memory Memory: 7-Complete Independence: No helper  Medical Problem  List and Plan:  1. DVT Prophylaxis/Anticoagulation: Mechanical: Sequential compression devices, below knee Bilateral lower extremities-gait 2. Pain Management: Continue hydrocodone prn  3. Mood: Monitor for now. Seems very motivated.  4. Renal transplant with chronic allograft nephropathy: continue HD on prn basis per nephrology. Fluid overload improving. Strict I and O. Continue prednisone and sirolimus. Po lasix initiated. Making more urine. Serial Cr's.  5. Left calf ulcer- continue collagegase dressing- area needs to be packed 6. Afib: Continue amiodarone. ?coumadin per cardiology.  7. Anemia of chronic disease:  Continue aranesp weekly. 8. Pulmonary- no problems with trach plugging. Dc trach today.  LOS (Days) 6 A FACE TO FACE EVALUATION WAS PERFORMED  Kenn Rekowski T 07/03/2011, 6:26 AM

## 2011-07-03 NOTE — Progress Notes (Signed)
Occupational Therapy Session Note  Patient Details  Name: Chad [name] MRN: [id number] Date of Birth: [birth year]  Today's Date: 07/03/2011 Time: 0830-0930 Time Calculation (min): 60 min  Short Term Goals: Week 1:  OT Short Term Goal 1 (Week 1): Patient will complete bathing, seated, in walk-in shower with min assist OT Short Term Goal 2 (Week 1): Patient will complete transfers bed/toilet/w/c with min assist OT Short Term Goal 3 (Week 1): Patient will demo improved functional endurance as evidenced by ability to complete bathing/dressing/grooming, seated and standing in 60 min. OT Short Term Goal 4 (Week 1): Patient will demo ability to use assistive devices, prn, with min assist  to complete lower body bathing and dressing OT Short Term Goal 5 (Week 1): Patient will demonstrate understanding of 3 prinicples of energy conservation during BADL.  Skilled Therapeutic Interventions/Progress Updates:    1:1 self care retraining at sink level: focus on activity tolerance, functional ambulation with RW in and out of bathroom, toilet transfers, toileting, standing tolerance, sit to stand (initally in session mod A but for the last stand of session needed +2 secondary to fatigue.), dressing LB without AE with rest breaks frequently.   2nd session 1345-1415 1:1 therapeutic activity focusing on functional ambulation from in the gym to room 4028 with close supervision and mod A for the sit to stand, then propelled self in w/c to room with 3 rest breaks along the way. Orange theraband exercises to continue to improve UB strength (deltoids, bicep, triceps, adductors and abductors)  Therapy Documentation Precautions:  Precautions Precautions: Fall Restrictions Weight Bearing Restrictions: No Vital Signs: Therapy Vitals Pulse Rate: 81  Resp: 18  Patient Position, if appropriate: Sitting Oxygen Therapy SpO2: 98 % O2 Device: None (Room air) Pain:  no c/o pain  See FIM for current functional  status  Therapy/Group: Individual Therapy  Roney Mans [facility] 07/03/2011, 2:55 PM

## 2011-07-03 NOTE — Progress Notes (Signed)
Nursing Note: pt has voided many times.Scanned twice for 68 cc and 145 ccwbb

## 2011-07-03 NOTE — Progress Notes (Signed)
Physical Therapy Note  Patient Details  Name: Chad [name] MRN: [id number] Date of Birth: 1955/06/25 Today's Date: 07/03/2011  Time: 1300-1345 45 minutes  No c/o pain.  Gait with RW close supervision in controlled environment 110', 135' with no LOB, improved cadence, cues for staying close to RW with turns when fatigued.  Ramp/curb training with RW with min A, required prolonged standing rest breaks after ascending and descending curb.  Discussed community energy conservation techniques with pt expressing understanding.  Overall continues to improve activity tolerance daily.  Individual therapy   Tyjuan Demetro 07/03/2011, 2:23 PM

## 2011-07-03 NOTE — Progress Notes (Signed)
Physical Therapy Note  Patient Details  Name: Chad [name] MRN: [id number] Date of Birth: 05-21-1955 Today's Date: 07/03/2011  Time: [phone] 57 minutes  No c/o pain.  Pt c/o increased fatigue today.  Sit to stands from w/c level with mod A, from same height mat table with supervision-min A with fatigued.  Gait training 120' with RW close supervision, 110'x2 with 4 minute standing rest break in between.  Pt reports he needs standing rest breaks to catch his breath but prefers standing to strengthen his leg muscles.  Pt educated on different options for home and community mobility rollator vs w/c.  Pt reports understanding.  Standing ball kick task with pt able to stand x 8 mins, 5 mins.  Pt required more frequent, prolonged rests in standing and sitting this session, increased fatigue.    Individual therapy   [name] 07/03/2011, 11:07 AM

## 2011-07-03 NOTE — Progress Notes (Signed)
Social Work Patient ID: MAKYE RADLE, male   DOB: September 04, 1955, 56 y.o.   MRN: [id number]  Brief visit with patient today - he is very pleased that trach is out today and feels he's "...getting stronger everyday".  Continues to be very motivated and denies any emotional distress at this time.  Aware and agreeable with target d/c 6/21 @ modified independent goals overall.  Lyndell Allaire

## 2011-07-04 DIAGNOSIS — Z5189 Encounter for other specified aftercare: Secondary | ICD-10-CM

## 2011-07-04 DIAGNOSIS — G7281 Critical illness myopathy: Secondary | ICD-10-CM

## 2011-07-04 LAB — RENAL FUNCTION PANEL
Albumin: 2.1 g/dL — ABNORMAL LOW (ref 3.5–5.2)
BUN: 69 mg/dL — ABNORMAL HIGH (ref 6–23)
Creatinine, Ser: 4.99 mg/dL — ABNORMAL HIGH (ref 0.50–1.35)
Phosphorus: 6 mg/dL — ABNORMAL HIGH (ref 2.3–4.6)
Potassium: 3.4 mEq/L — ABNORMAL LOW (ref 3.5–5.1)

## 2011-07-04 MED ORDER — SIROLIMUS 1 MG PO TABS
1.0000 mg | ORAL_TABLET | Freq: Every day | ORAL | Status: DC
  Administered 2011-07-04 – 2011-07-08 (×5): 1 mg via ORAL
  Filled 2011-07-04 (×8): qty 1

## 2011-07-04 NOTE — Progress Notes (Signed)
Occupational Therapy Session Note  Patient Details  Name: Chad [name] MRN: [id number] Date of Birth: 01-25-1956  Today's Date: 07/04/2011 Time: 0830-0930 Time Calculation (min): 60 min  Short Term Goals: Week 2:  OT Short Term Goal 1 (Week 2): Pt will perform bed mobility mod I in prep for ADL OT Short Term Goal 2 (Week 2): Pt will perform toilet transfers with min A  OT Short Term Goal 3 (Week 2): Pt will dress LB sit to stand with close supervision  OT Short Term Goal 4 (Week 2): Pt will obtain clothing from drawers with RW in standing with supervision   Skilled Therapeutic Interventions/Progress Updates:    1:1 self care retraining: including functional ambulation to BR with RW, toileting, toilet transfers, sit to stand, standing tolerance and balance for clothing management and brushing teeth at sink (standing), activity tolerance with frequent rest breaks at he needed them. Pt with increased exertion with sit to stands.  Therapy Documentation Precautions:  Precautions Precautions: Fall Restrictions Weight Bearing Restrictions: No Pain:  no c/o pain  See FIM for current functional status  Therapy/Group: Individual Therapy  Roney Mans [facility] 07/04/2011, 9:46 AM

## 2011-07-04 NOTE — Progress Notes (Signed)
Physical Therapy Weekly Progress Note  Patient Details  Name: Chad [name] MRN: [id number] Date of Birth: Jan 16, 1956  Today's Date: 07/04/2011  Patient has met 3 of 3 short term goals.  Pt has increased functional independence and is now min-mod A with sit to stands, close supervision with gait and min A with stairs.  Patient continues to demonstrate the following deficits: decreased strength, decreased activity tolerance, impaired functional transfers and therefore will continue to benefit from skilled PT intervention to enhance overall performance with activity tolerance, balance and ability to compensate for deficits.  Patient progressing toward long term goals..  Continue plan of care.  PT Short Term Goals Week 1:  PT Short Term Goal 1 (Week 1): Pt will perform bed <> chair transfers consistently with  mod A PT Short Term Goal 1 - Progress (Week 1): Met PT Short Term Goal 2 (Week 1): Pt will gait with supervision 50' in controlled environment PT Short Term Goal 2 - Progress (Week 1): Met PT Short Term Goal 3 (Week 1): Pt will tolerate standing balance x 2 minutes for functional task PT Short Term Goal 3 - Progress (Week 1): Met Week 2:  PT Short Term Goal 1 (Week 2): Pt will consistantly perform functional transfers with min A PT Short Term Goal 2 (Week 2): Pt will gait with close supervision 150' in controlled environment with no standing rest break PT Short Term Goal 3 (Week 2): Pt will demo dynamic standing balance for functional task x 4 minutes with supervision  Skilled Therapeutic Interventions/Progress Updates:  Ambulation/gait training;Balance/vestibular training;Functional mobility training;Therapeutic Activities;UE/LE Coordination activities;Wheelchair propulsion/positioning;UE/LE Strength taining/ROM;Stair training;Patient/family education;Discharge planning;DME/adaptive equipment instruction;Community reintegration;Therapeutic Exercise;Pain management     See FIM for  current functional status   [name] 07/04/2011, 7:57 AM

## 2011-07-04 NOTE — Progress Notes (Signed)
Physical Therapy Note  Patient Details  Name: Chad [name] MRN: [id number] Date of Birth: July 02, 1955 Today's Date: 07/04/2011  Time: [phone] 60 minutes  No c/o pain.  Sit to stand transfers throughout session with min-mod A varying with fatigue.  Gait training with RW close supervision 2 x 110' with 3 minute standing rest break, 3 x 40' household gait with close supervision.  Stair climbing 2 x 3 stairs with min A, bilateral handrails.  Pt continues to require frequent standing and seated rest breaks but has improved general activity tolerance to standing and gait training.  Nu step for continued strength and endurance training 10 minutes level 4.  Individual therapy   Alleyah Twombly 07/04/2011, 11:10 AM

## 2011-07-04 NOTE — Progress Notes (Signed)
Occupational Therapy Session Note  Patient Details  Name: Chad [name] MRN: [id number] Date of Birth: 02/29/1955  Today's Date: 07/04/2011 Time: 1130-1200 Time Calculation (min): 30 min  Short Term Goals: Week 2:  OT Short Term Goal 1 (Week 2): Pt will perform bed mobility mod I in prep for ADL OT Short Term Goal 2 (Week 2): Pt will perform toilet transfers with min A  OT Short Term Goal 3 (Week 2): Pt will dress LB sit to stand with close supervision  OT Short Term Goal 4 (Week 2): Pt will obtain clothing from drawers with RW in standing with supervision   Skilled Therapeutic Interventions/Progress Updates:    1:1 therapeutic activity focusing on core and UE muscle strengthening with performing PNF exercises with weighted ball without LE support, modified situps (forward and laterally) to improve posture and activity tolerance.   Therapy Documentation Precautions:  Precautions Precautions: Fall Restrictions Weight Bearing Restrictions: No Pain:  no c/o pain  See FIM for current functional status  Therapy/Group: Individual Therapy  Roney Mans [facility] 07/04/2011, 1:43 PM

## 2011-07-04 NOTE — Progress Notes (Signed)
S: No new CO O:BP 112/68  Pulse 80  Temp(Src) 98.1 F (36.7 C) (Oral)  Resp 16  Ht 6' (1.829 m)  Wt 117.6 kg (259 lb 4.2 oz)  BMI 35.16 kg/m2  SpO2 98%  Intake/Output Summary (Last 24 hours) at 07/04/11 0842 Last data filed at 07/04/11 0644  Gross per 24 hour  Intake   1080 ml  Output    900 ml  Net    180 ml   Weight change: 5.29 kg (11 lb 10.6 oz) AOZ:HYQMV and alert CVS:RRR Resp:Clear Abd:+ BS NTND Ext: 2-3+ edema, improving NEURO:CNI Ox3 Rt perm cath      . amiodarone  200 mg Oral Daily  . antiseptic oral rinse  15 mL Mouth Rinse QID  . calcitRIOL  0.25 mcg Oral Daily  . chlorhexidine  15 mL Mouth Rinse BID  . collagenase   Topical Daily  . darbepoetin (ARANESP) injection - NON-DIALYSIS  100 mcg Subcutaneous Q Thu-1800  . furosemide  80 mg Oral BID  . pantoprazole  40 mg Oral BID AC  . predniSONE  10 mg Oral Q breakfast  . sirolimus  1 mg Oral Daily  . DISCONTD: darbepoetin (ARANESP) injection - DIALYSIS  100 mcg Intravenous Q Thu-HD  . DISCONTD: sirolimus  0.75 mg Oral Daily   No results found. BMET    Component Value Date/Time   NA 133* 07/04/2011 0620   K 3.4* 07/04/2011 0620   CL 98 07/04/2011 0620   CO2 21 07/04/2011 0620   GLUCOSE 75 07/04/2011 0620   BUN 69* 07/04/2011 0620   CREATININE 4.99* 07/04/2011 0620   CALCIUM 8.3* 07/04/2011 0620   GFRNONAA 12* 07/04/2011 0620   GFRAA 14* 07/04/2011 0620   CBC    Component Value Date/Time   WBC 10.2 07/03/2011 0630   RBC 2.56* 07/03/2011 0630   HGB 8.3* 07/03/2011 0630   HCT 26.3* 07/03/2011 0630   PLT 201 07/03/2011 0630   MCV 102.7* 07/03/2011 0630   MCH 32.4 07/03/2011 0630   MCHC 31.6 07/03/2011 0630   RDW 18.8* 07/03/2011 0630   LYMPHSABS 2.0 06/30/2011 0625   MONOABS 1.2* 06/30/2011 0625   EOSABS 0.1 06/30/2011 0625   BASOSABS 0.0 06/30/2011 0625     Assessment: 1. 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 PNA, resolving 3. Anemia on Aranesp 4.  Sec HPTH    Plan: 1. Cont to follow Scr 2.  No HD today 3. Await sirolimus level 4.  Plan to recheck labs on sun   Chad [name] T

## 2011-07-04 NOTE — Progress Notes (Signed)
Subjective/Complaints: Chad [name] out without issues. Stamina better. Continues to make urine.       Review of Systems  Respiratory: Negative for cough and shortness of breath.   Cardiovascular: Positive for leg swelling.       Chronic dorsum of L foot  Gastrointestinal: Negative.   Genitourinary:       Good output  Neurological: Positive for focal weakness and weakness.       Both legs weak  All other systems reviewed and are negative.    Objective: Vital Signs: Blood pressure 112/68, pulse 79, temperature 98.1 F (36.7 C), temperature source Oral, resp. rate 18, height 6' (1.829 m), weight 117.6 kg (259 lb 4.2 oz), SpO2 97.00%. No results found. Results for orders placed during the hospital encounter of 06/27/11 (from the past 72 hour(s))  RENAL FUNCTION PANEL     Status: Abnormal   Collection Time   07/02/11  6:45 AM      Component Value Range Comment   Sodium 131 (*) 135 - 145 (mEq/L)    Potassium 3.6  3.5 - 5.1 (mEq/L)    Chloride 96  96 - 112 (mEq/L)    CO2 21  19 - 32 (mEq/L)    Glucose, Bld 84  70 - 99 (mg/dL)    BUN 67 (*) 6 - 23 (mg/dL)    Creatinine, Ser 1.61 (*) 0.50 - 1.35 (mg/dL)    Calcium 8.4  8.4 - 10.5 (mg/dL)    Phosphorus 5.2 (*) 2.3 - 4.6 (mg/dL)    Albumin 2.1 (*) 3.5 - 5.2 (g/dL)    GFR calc non Af Amer 12 (*) >90 (mL/min)    GFR calc Af Amer 14 (*) >90 (mL/min)   RENAL FUNCTION PANEL     Status: Abnormal   Collection Time   07/03/11  6:30 AM      Component Value Range Comment   Sodium 131 (*) 135 - 145 (mEq/L)    Potassium 3.5  3.5 - 5.1 (mEq/L)    Chloride 95 (*) 96 - 112 (mEq/L)    CO2 21  19 - 32 (mEq/L)    Glucose, Bld 83  70 - 99 (mg/dL)    BUN 68 (*) 6 - 23 (mg/dL)    Creatinine, Ser 0.96 (*) 0.50 - 1.35 (mg/dL)    Calcium 8.1 (*) 8.4 - 10.5 (mg/dL)    Phosphorus 5.5 (*) 2.3 - 4.6 (mg/dL)    Albumin 2.0 (*) 3.5 - 5.2 (g/dL)    GFR calc non Af Amer 12 (*) >90 (mL/min)    GFR calc Af Amer 14 (*) >90 (mL/min)   CBC     Status: Abnormal   Collection Time   07/03/11  6:30 AM      Component Value Range Comment   WBC 10.2  4.0 - 10.5 (K/uL)    RBC 2.56 (*) 4.22 - 5.81 (MIL/uL)    Hemoglobin 8.3 (*) 13.0 - 17.0 (g/dL)    HCT 04.5 (*) 40.9 - 52.0 (%)    MCV 102.7 (*) 78.0 - 100.0 (fL)    MCH 32.4  26.0 - 34.0 (pg)    MCHC 31.6  30.0 - 36.0 (g/dL)    RDW 81.1 (*) 91.4 - 15.5 (%)    Platelets 201  150 - 400 (K/uL)   IRON AND TIBC     Status: Abnormal   Collection Time   07/03/11  6:30 AM      Component Value Range Comment   Iron 43  42 -  135 (ug/dL)    TIBC 478 (*) 295 - 435 (ug/dL)    Saturation Ratios 26  20 - 55 (%)    UIBC 121 (*) 125 - 400 (ug/dL)   FERRITIN     Status: Abnormal   Collection Time   07/03/11  6:30 AM      Component Value Range Comment   Ferritin 1309 (*) 22 - 322 (ng/mL)   RENAL FUNCTION PANEL     Status: Abnormal   Collection Time   07/04/11  6:20 AM      Component Value Range Comment   Sodium 133 (*) 135 - 145 (mEq/L)    Potassium 3.4 (*) 3.5 - 5.1 (mEq/L)    Chloride 98  96 - 112 (mEq/L)    CO2 21  19 - 32 (mEq/L)    Glucose, Bld 75  70 - 99 (mg/dL)    BUN 69 (*) 6 - 23 (mg/dL)    Creatinine, Ser 6.21 (*) 0.50 - 1.35 (mg/dL)    Calcium 8.3 (*) 8.4 - 10.5 (mg/dL)    Phosphorus 6.0 (*) 2.3 - 4.6 (mg/dL)    Albumin 2.1 (*) 3.5 - 5.2 (g/dL)    GFR calc non Af Amer 12 (*) >90 (mL/min)    GFR calc Af Amer 14 (*) >90 (mL/min)     HEENT: EOMI, PERRL Heart: RRR, No M,R,G Abdomen: bs+, non-tender. Edema 2+ on either limb. Wound on back of left calf with less fibronecrotic material.  Trach stoma pink with granulation and already closing.. No distress. Chest remains clear.  Good insight, awareness, memory/  Assessment/Plan: 1. Functional deficits secondary to critical illness myopathy which require 3+ hours per day of interdisciplinary therapy in a comprehensive inpatient rehab setting. Physiatrist is providing close team supervision and 24 hour management of active medical problems listed  below. Physiatrist and rehab team continue to assess barriers to discharge/monitor patient progress toward functional and medical goals. FIM: FIM - Bathing Bathing Steps Patient Completed: Chest;Right upper leg;Right Arm;Left upper leg;Left Arm;Abdomen;Front perineal area;Buttocks Bathing: 4: Min-Patient completes 8-9 34f 10 parts or 75+ percent  FIM - Upper Body Dressing/Undressing Upper body dressing/undressing steps patient completed: Thread/unthread right sleeve of pullover shirt/dresss;Thread/unthread left sleeve of pullover shirt/dress;Put head through opening of pull over shirt/dress;Pull shirt over trunk Upper body dressing/undressing: 5: Supervision: Safety issues/verbal cues FIM - Lower Body Dressing/Undressing Lower body dressing/undressing steps patient completed: Thread/unthread right pants leg;Thread/unthread left pants leg;Pull pants up/down;Don/Doff right sock;Don/Doff left sock Lower body dressing/undressing: 1: Two helpers  FIM - Toileting Toileting steps completed by patient: Adjust clothing prior to toileting;Adjust clothing after toileting;Performs perineal hygiene Toileting: 4: Steadying assist  FIM - Diplomatic Services operational officer Devices: Elevated toilet seat;Walker Toilet Transfers: 3-To toilet/BSC: Mod A (lift or lower assist);3-From toilet/BSC: Mod A (lift or lower assist)  FIM - Bed/Chair Transfer Bed/Chair Transfer Assistive Devices: Bed rails;HOB elevated;Walker Bed/Chair Transfer: 1: Two helpers  FIM - Locomotion: Wheelchair Distance: 20' Locomotion: Wheelchair: 1: Total Assistance/staff pushes wheelchair (Pt<25%) FIM - Locomotion: Ambulation Locomotion: Ambulation Assistive Devices: Designer, industrial/product Ambulation/Gait Assistance: 4: Min guard Locomotion: Ambulation: 2: Travels 50 - 149 ft with minimal assistance (Pt.>75%)  Comprehension Comprehension Mode: Auditory Comprehension: 7-Follows complex conversation/direction: With no  assist  Expression Expression Mode: Verbal Expression Assistive Devices: 6-Talk trach valve Expression: 6-Expresses complex ideas: With extra time/assistive device  Social Interaction Social Interaction: 7-Interacts appropriately with others - No medications needed.  Problem Solving Problem Solving: 7-Solves complex problems: Recognizes & self-corrects  Memory  Memory: 7-Complete Independence: No helper  Medical Problem List and Plan:  1. DVT Prophylaxis/Anticoagulation: Mechanical: Sequential compression devices, below knee Bilateral lower extremities-ambulating 2. Pain Management: Continue hydrocodone prn  3. Mood: Monitor for now. Seems very motivated.  4. Renal transplant with chronic allograft nephropathy: continue HD on prn basis per nephrology. Fluid overload improving. Strict I and O. Continue prednisone and sirolimus. Po lasix initiated. Making more urine. Cr's seem to be holding from 4.9 to 5.0 5. Left calf ulcer- continue collagegase dressing with packing 6. Afib: Continue amiodarone. ?coumadin per cardiology--discuss next week. 7. Anemia of chronic disease:  Continue aranesp weekly. 8. Pulmonary- trach out and no issues whatsoever. Continue occlusive dressing.  LOS (Days) 7 A FACE TO FACE EVALUATION WAS PERFORMED  Orson Rho T 07/04/2011, 8:02 AM

## 2011-07-04 NOTE — Progress Notes (Signed)
Physical Therapy Note  Patient Details  Name: ARUSH GATLIFF MRN: [id number] Date of Birth: 1955-07-26 Today's Date: 07/04/2011  Time: 1300-1345 45 minutes   No c/o pain.  Treatment focus on gait on carpet surface with household obstacle negotiation and side stepping.  Pt fatigues easily with side stepping, required frequent prolonged standing rest breaks.  Good obstacle negotiation with RW, able to perform with supervision.  Attempt gait without AD with min-mod A, pt able to gait 20' before fatigued and needing to sit.  Pt with improved activity tolerance but continues to need frequent and prolonged standing and seated rests throughout treatment.  Individual therapy   Kateryna Grantham 07/04/2011, 2:24 PM

## 2011-07-05 NOTE — Progress Notes (Signed)
Patient ID: Chad [name], male   DOB: August 01, 1955, 56 y.o.   MRN: [id number] Subjective/Complaints: Chad [name] out without issues. Stamina better.  He denies pain       Review of Systems  Respiratory: Negative for cough and shortness of breath.   Cardiovascular: Positive for leg swelling.       Chronic dorsum of L foot  Gastrointestinal: Negative.   Genitourinary:       Good output  Neurological: Positive for focal weakness and weakness.       Both legs weak    Objective: Vital Signs: Blood pressure 123/73, pulse 78, temperature 97 F (36.1 C), temperature source Oral, resp. rate 20, height 6' (1.829 m), weight 259 lb 11.2 oz (117.8 kg), SpO2 95.00%.   well-developed well-nourished male in no acute distress. HEENT exam - trach site without drainage, normocephalic, neck supple without jugular venous distention. Chest clear to auscultation cardiac exam S1-S2 are regular. Abdominal exam  with bowel sounds, soft and nontender. Extremities with edema.  Assessment/Plan: 1. Functional deficits secondary to critical illness myopathy which require 3+ hours per day of interdisciplinary therapy in a comprehensive inpatient rehab setting. Physiatrist is providing close team supervision and 24 hour management of active medical problems listed below. Physiatrist and rehab team continue to assess barriers to discharge/monitor patient progress toward functional and medical goals. FIM: FIM - Bathing Bathing Steps Patient Completed: Chest;Right upper leg;Right Arm;Left upper leg;Left Arm;Abdomen;Front perineal area;Buttocks Bathing: 4: Min-Patient completes 8-9 97f 10 parts or 75+ percent  FIM - Upper Body Dressing/Undressing Upper body dressing/undressing steps patient completed: Thread/unthread right sleeve of pullover shirt/dresss;Thread/unthread left sleeve of pullover shirt/dress;Put head through opening of pull over shirt/dress;Pull shirt over trunk Upper body dressing/undressing: 5: Supervision:  Safety issues/verbal cues FIM - Lower Body Dressing/Undressing Lower body dressing/undressing steps patient completed: Thread/unthread right pants leg;Thread/unthread left pants leg;Pull pants up/down;Don/Doff right sock;Don/Doff left sock Lower body dressing/undressing: 1: Two helpers  FIM - Toileting Toileting steps completed by patient: Adjust clothing prior to toileting;Adjust clothing after toileting;Performs perineal hygiene Toileting: 4: Steadying assist  FIM - Diplomatic Services operational officer Devices: Elevated toilet seat;Walker Toilet Transfers: 5-To toilet/BSC: Supervision (verbal cues/safety issues)  FIM - Banker Devices: Bed rails;HOB elevated Bed/Chair Transfer: 4: Bed > Chair or W/C: Min A (steadying Pt. > 75%);4: Chair or W/C > Bed: Min A (steadying Pt. > 75%)  FIM - Locomotion: Wheelchair Distance: 20' Locomotion: Wheelchair: 1: Total Assistance/staff pushes wheelchair (Pt<25%) FIM - Locomotion: Ambulation Locomotion: Ambulation Assistive Devices: Designer, industrial/product Ambulation/Gait Assistance: 4: Min guard Locomotion: Ambulation: 2: Travels 50 - 149 ft with supervision/safety issues  Comprehension Comprehension Mode: Auditory Comprehension: 6-Follows complex conversation/direction: With extra time/assistive device  Expression Expression Mode: Verbal Expression Assistive Devices: 6-Talk trach valve Expression: 7-Expresses complex ideas: With no assist  Social Interaction Social Interaction: 7-Interacts appropriately with others - No medications needed.  Problem Solving Problem Solving: 6-Solves complex problems: With extra time  Memory Memory: 7-Complete Independence: No helper  Medical Problem List and Plan:  1. DVT Prophylaxis/Anticoagulation: Mechanical: Sequential compression devices, below knee Bilateral lower extremities-ambulating 2. Pain Management: Continue hydrocodone prn  3. Mood: Monitor for  now. Seems very motivated.  4. Renal transplant with chronic allograft nephropathy: continue HD on prn basis per nephrology. Fluid overload improving. Strict I and O. Continue prednisone and sirolimus. Po lasix initiated. Making more urine. Cr's seem to be holding from 4.9 to 5.0 Basic Metabolic Panel:    Component Value Date/Time  NA 133* 07/04/2011 0620   K 3.4* 07/04/2011 0620   CL 98 07/04/2011 0620   CO2 21 07/04/2011 0620   BUN 69* 07/04/2011 0620   CREATININE 4.99* 07/04/2011 0620   GLUCOSE 75 07/04/2011 0620   CALCIUM 8.3* 07/04/2011 0620    5. Left calf ulcer- continue collagegase dressing with packing 6. Afib: Continue amiodarone. ?coumadin per cardiology--discuss next week. 7. Anemia of chronic disease:  Continue aranesp weekly. 8. Pulmonary- trach out and no issues whatsoever. Continue occlusive dressing.  LOS (Days) 8 A FACE TO FACE EVALUATION WAS PERFORMED  Copelan Maultsby HENRY 07/05/2011, 8:47 AM

## 2011-07-05 NOTE — Progress Notes (Signed)
Occupational Therapy Session Note  Patient Details  Name: Chad [name] MRN: [id number] Date of Birth: 1955-06-04  Today's Date: 07/05/2011 Time: 1000-1055 Time Calculation (min): 55 min  Short Term Goals: Week 1:  OT Short Term Goal 1 (Week 1): Patient will complete bathing, seated, in walk-in shower with min assist OT Short Term Goal 1 - Progress (Week 1): Met OT Short Term Goal 2 (Week 1): Patient will complete transfers bed/toilet/w/c with min assist OT Short Term Goal 2 - Progress (Week 1): Not met OT Short Term Goal 3 (Week 1): Patient will demo improved functional endurance as evidenced by ability to complete bathing/dressing/grooming, seated and standing in 60 min. OT Short Term Goal 3 - Progress (Week 1): Not met OT Short Term Goal 4 (Week 1): Patient will demo ability to use assistive devices, prn, with min assist  to complete lower body bathing and dressing OT Short Term Goal 4 - Progress (Week 1): Met OT Short Term Goal 5 (Week 1): Patient will demonstrate understanding of 3 prinicples of energy conservation during BADL. OT Short Term Goal 5 - Progress (Week 1): Met  Week 2:  OT Short Term Goal 1 (Week 2): Pt will perform bed mobility mod I in prep for ADL OT Short Term Goal 2 (Week 2): Pt will perform toilet transfers with min A  OT Short Term Goal 3 (Week 2): Pt will dress LB sit to stand with close supervision  OT Short Term Goal 4 (Week 2): Pt will obtain clothing from drawers with RW in standing with supervision   Skilled Therapeutic Interventions/Progress Updates:  No complaints of pain Upon entering room patient seated in w/c. Engaged in ADL retraining at sink level in sit -> stand position. Focused skilled intervention on functional mobility in room using w/c for patient to gather own clothes more independently, UB bathing & dressing sitting at sink in w/c, LB bathing & dressing in sit -> stand position at sink, and overall activity tolerance/endurance. Patient with  more than reasonable amount of time to complete tasks. Therapist also educated patient on edema/swelling in bilateral LEs; keeping LEs elevated during day instead of having them down/dangling all day. Left patient seated in w/c with BLE elevated on chair, and call bell & phone within reach.  Precautions:  Precautions Precautions: Fall Restrictions Weight Bearing Restrictions: No  See FIM for current functional status  Therapy/Group: Individual Therapy  Thalia Turkington 07/05/2011, 11:24 AM

## 2011-07-05 NOTE — Progress Notes (Signed)
S: Eating well, walked some without assistance yesterday O:BP 123/73  Pulse 78  Temp(Src) 97 F (36.1 C) (Oral)  Resp 20  Ht 6' (1.829 m)  Wt 117.8 kg (259 lb 11.2 oz)  BMI 35.22 kg/m2  SpO2 95%  Intake/Output Summary (Last 24 hours) at 07/05/11 0828 Last data filed at 07/05/11 2952  Gross per 24 hour  Intake    720 ml  Output   1400 ml  Net   -680 ml   Weight change: 0.2 kg (7.1 oz) WUX:LKGMW and alert CVS:RRR Resp:Clear Abd:+ BS NTND Ext: 2+ edema bilat lower legs NEURO:CNI Ox3 Rt perm cath      . amiodarone  200 mg Oral Daily  . antiseptic oral rinse  15 mL Mouth Rinse QID  . calcitRIOL  0.25 mcg Oral Daily  . chlorhexidine  15 mL Mouth Rinse BID  . collagenase   Topical Daily  . darbepoetin (ARANESP) injection - NON-DIALYSIS  100 mcg Subcutaneous Q Thu-1800  . furosemide  80 mg Oral BID  . pantoprazole  40 mg Oral BID AC  . predniSONE  10 mg Oral Q breakfast  . sirolimus  1 mg Oral Daily   No results found. BMET    Component Value Date/Time   NA 133* 07/04/2011 0620   K 3.4* 07/04/2011 0620   CL 98 07/04/2011 0620   CO2 21 07/04/2011 0620   GLUCOSE 75 07/04/2011 0620   BUN 69* 07/04/2011 0620   CREATININE 4.99* 07/04/2011 0620   CALCIUM 8.3* 07/04/2011 0620   GFRNONAA 12* 07/04/2011 0620   GFRAA 14* 07/04/2011 0620   CBC    Component Value Date/Time   WBC 10.2 07/03/2011 0630   RBC 2.56* 07/03/2011 0630   HGB 8.3* 07/03/2011 0630   HCT 26.3* 07/03/2011 0630   PLT 201 07/03/2011 0630   MCV 102.7* 07/03/2011 0630   MCH 32.4 07/03/2011 0630   MCHC 31.6 07/03/2011 0630   RDW 18.8* 07/03/2011 0630   LYMPHSABS 2.0 06/30/2011 0625   MONOABS 1.2* 06/30/2011 0625   EOSABS 0.1 06/30/2011 0625   BASOSABS 0.0 06/30/2011 0625     Assessment: 1. 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 Will hold lasix (pt with chronic stable edema at baseline) and see if creat will improve 2. PNA, resolving 3. Anemia on Aranesp 4.  Sec HPTH     Plan: 1. Cont to follow Scr, hold diuretics for now 2.  No HD today 3. Await sirolimus level 4.  Recheck labs on sun   Lenea Bywater D

## 2011-07-06 LAB — RENAL FUNCTION PANEL
BUN: 73 mg/dL — ABNORMAL HIGH (ref 6–23)
Calcium: 8.2 mg/dL — ABNORMAL LOW (ref 8.4–10.5)
Creatinine, Ser: 5.08 mg/dL — ABNORMAL HIGH (ref 0.50–1.35)
Glucose, Bld: 74 mg/dL (ref 70–99)
Phosphorus: 5.5 mg/dL — ABNORMAL HIGH (ref 2.3–4.6)

## 2011-07-06 MED ORDER — POTASSIUM CHLORIDE CRYS ER 10 MEQ PO TBCR
10.0000 meq | EXTENDED_RELEASE_TABLET | Freq: Every day | ORAL | Status: DC
  Administered 2011-07-06 – 2011-07-16 (×11): 10 meq via ORAL
  Filled 2011-07-06 (×12): qty 1

## 2011-07-06 NOTE — Progress Notes (Signed)
Physical Therapy Note  Patient Details  Name: Chad [name] MRN: [id number] Date of Birth: 07-Sep-1955 Today's Date: 07/06/2011  1445-1525 (40 minutes) individual Pain: no complaint of pain  Oxygen sats 95% RA pulse 90 BPM (resting) Focus of treatment: Therapeutic exercises focused on activity tolerance/bilateral LE strengthening to improve tolerance to activity; gait training to improve functional distance. Treatment:Sit to stand from wc min/mod assist; Nustep (ergonometer) Level 4 X 10 minutes (Oxygen sats = 96% RA pulse 90 perceived exertion= 13 ); sit to stand from raised mat (25 inches) hands on knees 2 X 5; gait 160 feet RW SBA with 2/4 dyspnea.   Chad [name],Chad [name] 07/06/2011, 2:51 PM

## 2011-07-06 NOTE — Progress Notes (Addendum)
Patient ID: LATERRIAN HEVENER, male   DOB: 1955-11-17, 56 y.o.   MRN: [id number] Patient ID: CRAIGORY TOSTE, male   DOB: December 13, 1955, 56 y.o.   MRN: [id number] Subjective/Complaints: [name] out without issues. Stamina better.  He denies pain       Review of Systems  Cardiovascular: Positive for leg swelling.       Chronic dorsum of L foot  Gastrointestinal: Negative.   Genitourinary:       Good output  Neurological: Positive for focal weakness and weakness.       Both legs weak    Objective: Vital Signs: Blood pressure 126/66, pulse 78, temperature 98.6 F (37 C), temperature source Oral, resp. rate 18, height 6' (1.829 m), weight 259 lb 11.2 oz (117.8 kg), SpO2 96.00%.   no acute distress. HEENT exam - trach site without drainage, normocephalic, neck supple without jugular venous distention. Chest clear to auscultation cardiac exam S1-S2 are regular. Abdominal exam  with bowel sounds, soft and nontender. Extremities with edema + at feet  Assessment/Plan: 1. Functional deficits secondary to critical illness myopathy which require 3+ hours per day of interdisciplinary therapy in a comprehensive inpatient rehab setting. Physiatrist is providing close team supervision and 24 hour management of active medical problems listed below. Physiatrist and rehab team continue to assess barriers to discharge/monitor patient progress toward functional and medical goals. FIM: FIM - Bathing Bathing Steps Patient Completed: Chest;Right Arm;Left Arm;Abdomen;Front perineal area;Buttocks;Right upper leg;Left upper leg;Right lower leg (including foot);Left lower leg (including foot) Bathing: 4: Steadying assist  FIM - Upper Body Dressing/Undressing Upper body dressing/undressing steps patient completed: Thread/unthread right sleeve of pullover shirt/dresss;Thread/unthread left sleeve of pullover shirt/dress;Put head through opening of pull over shirt/dress;Pull shirt over trunk Upper body dressing/undressing: 7:  Complete Independence: No helper FIM - Lower Body Dressing/Undressing Lower body dressing/undressing steps patient completed: Thread/unthread right pants leg;Thread/unthread left pants leg;Pull pants up/down Lower body dressing/undressing: 4: Steadying Assist  FIM - Toileting Toileting steps completed by patient: Adjust clothing prior to toileting;Performs perineal hygiene;Adjust clothing after toileting Toileting Assistive Devices: Grab bar or rail for support Toileting: 4: Steadying assist  FIM - Diplomatic Services operational officer Devices: Elevated toilet seat;Walker Toilet Transfers: 5-To toilet/BSC: Supervision (verbal cues/safety issues)  FIM - Banker Devices: Bed rails Bed/Chair Transfer: 4: Bed > Chair or W/C: Min A (steadying Pt. > 75%)  FIM - Locomotion: Wheelchair Distance: 20' Locomotion: Wheelchair: 1: Total Assistance/staff pushes wheelchair (Pt<25%) FIM - Locomotion: Ambulation Locomotion: Ambulation Assistive Devices: Designer, industrial/product Ambulation/Gait Assistance: 4: Min guard Locomotion: Ambulation: 2: Travels 50 - 149 ft with supervision/safety issues  Comprehension Comprehension Mode: Auditory Comprehension: 7-Follows complex conversation/direction: With no assist  Expression Expression Mode: Verbal Expression Assistive Devices: 6-Talk trach valve Expression: 7-Expresses complex ideas: With no assist  Social Interaction Social Interaction: 7-Interacts appropriately with others - No medications needed.  Problem Solving Problem Solving: 7-Solves complex problems: Recognizes & self-corrects  Memory Memory: 7-Complete Independence: No helper  Medical Problem List and Plan:  1. DVT Prophylaxis/Anticoagulation: Mechanical: Sequential compression devices, below knee Bilateral lower extremities-ambulating 2. Pain Management: Continue hydrocodone prn  3. Mood: Monitor for now. Seems very motivated.  4. Renal  transplant with chronic allograft nephropathy: continue HD on prn basis per nephrology. Fluid overload improving. Strict I and O. Continue prednisone and sirolimus. Po lasix initiated. Making more urine. Cr's seem to be holding from 4.9 to 5.0 Basic Metabolic Panel:    Component Value Date/Time  NA 135 07/06/2011 0635   K 3.2* 07/06/2011 0635   CL 99 07/06/2011 0635   CO2 21 07/06/2011 0635   BUN 73* 07/06/2011 0635   CREATININE 5.08* 07/06/2011 0635   GLUCOSE 74 07/06/2011 0635   CALCIUM 8.2* 07/06/2011 0635    5. Left calf ulcer- continue collagegase dressing with packing--- remains dressed 6. Afib: Continue amiodarone. ?coumadin per cardiology--discuss next week. 7. Anemia of chronic disease:  Continue aranesp weekly. 8. Pulmonary- trach out and no issues whatsoever. Continue occlusive dressing.- trach site looks good Hypokalemia- likely due to furosemide Lab Results  Component Value Date   K 3.2* 07/06/2011   Add low dose potassium LOS (Days) 9 A FACE TO FACE EVALUATION WAS PERFORMED  Conleigh Heinlein HENRY 07/06/2011, 8:53 AM

## 2011-07-06 NOTE — Progress Notes (Signed)
S: 950 cc UOP yesterday, creat up slightly to 5.08 (4.99).  UPin WC eating, no SOB or other complaints.   O:BP 126/66  Pulse 78  Temp(Src) 98.6 F (37 C) (Oral)  Resp 18  Ht 6' (1.829 m)  Wt 117.8 kg (259 lb 11.2 oz)  BMI 35.22 kg/m2  SpO2 96%  Intake/Output Summary (Last 24 hours) at 07/06/11 0853 Last data filed at 07/06/11 0730  Gross per 24 hour  Intake    480 ml  Output   1100 ml  Net   -620 ml   Weight change:  WUJ:WJXBJ and alert CVS:RRR Resp:Clear Abd:+ BS NTND Ext: 2+ edema bilat lower legs, + erythema LLE NEURO:CNI Ox3 Rt perm cath      . amiodarone  200 mg Oral Daily  . antiseptic oral rinse  15 mL Mouth Rinse QID  . calcitRIOL  0.25 mcg Oral Daily  . chlorhexidine  15 mL Mouth Rinse BID  . collagenase   Topical Daily  . darbepoetin (ARANESP) injection - NON-DIALYSIS  100 mcg Subcutaneous Q Thu-1800  . furosemide  80 mg Oral BID  . pantoprazole  40 mg Oral BID AC  . predniSONE  10 mg Oral Q breakfast  . sirolimus  1 mg Oral Daily   No results found. BMET    Component Value Date/Time   NA 135 07/06/2011 0635   K 3.2* 07/06/2011 0635   CL 99 07/06/2011 0635   CO2 21 07/06/2011 0635   GLUCOSE 74 07/06/2011 0635   BUN 73* 07/06/2011 0635   CREATININE 5.08* 07/06/2011 0635   CALCIUM 8.2* 07/06/2011 0635   GFRNONAA 12* 07/06/2011 0635   GFRAA 14* 07/06/2011 0635   CBC    Component Value Date/Time   WBC 10.2 07/03/2011 0630   RBC 2.56* 07/03/2011 0630   HGB 8.3* 07/03/2011 0630   HCT 26.3* 07/03/2011 0630   PLT 201 07/03/2011 0630   MCV 102.7* 07/03/2011 0630   MCH 32.4 07/03/2011 0630   MCHC 31.6 07/03/2011 0630   RDW 18.8* 07/03/2011 0630   LYMPHSABS 2.0 06/30/2011 0625   MONOABS 1.2* 06/30/2011 0625   EOSABS 0.1 06/30/2011 0625   BASOSABS 0.0 06/30/2011 0625     Assessment: 1. 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 Had CRRT 5/13-5/26, then one regular HD on 5/30 which was his last dialysis. Creat high, 4.9 > 5.0 today.  Holding lasix (pt with chronic  stable edema at baseline) to see if creat will improve. If not improving will likely have to resume dialysis. Has tunneled HD cath R IJ in place. 900cc UOP yesterday.    2. PNA, resolving 3. Anemia on Aranesp 4.  Sec HPTH    Plan: 1. Cont to follow Scr, hold diuretics for now 2. Await sirolimus level   Triston Skare D

## 2011-07-07 DIAGNOSIS — Z5189 Encounter for other specified aftercare: Secondary | ICD-10-CM

## 2011-07-07 DIAGNOSIS — G7281 Critical illness myopathy: Secondary | ICD-10-CM

## 2011-07-07 LAB — BASIC METABOLIC PANEL
CO2: 21 mEq/L (ref 19–32)
Chloride: 98 mEq/L (ref 96–112)
Creatinine, Ser: 4.83 mg/dL — ABNORMAL HIGH (ref 0.50–1.35)
Potassium: 3.2 mEq/L — ABNORMAL LOW (ref 3.5–5.1)

## 2011-07-07 LAB — SIROLIMUS LEVEL: Sirolimus (Rapamycin): 4.1 ng/mL

## 2011-07-07 MED ORDER — POTASSIUM CHLORIDE CRYS ER 20 MEQ PO TBCR
40.0000 meq | EXTENDED_RELEASE_TABLET | Freq: Once | ORAL | Status: AC
  Administered 2011-07-07: 40 meq via ORAL
  Filled 2011-07-07: qty 2

## 2011-07-07 NOTE — Progress Notes (Signed)
Physical Therapy Note  Patient Details  Name: Chad [name] MRN: [id number] Date of Birth: 05/11/1955 Today's Date: 07/07/2011  Time: 1300-1345 45 minutes  No c/o pain.  Nu step for LE strength and mobility x 10 minutes level 4 with RPE 13.  Gait training without AD with min A 2 x 100'!  Pt required 5 minute seated rest between sets of gait and 1 min standing rest break during second walk of 100'.  Overall pt with good balance without AD, no LOB.  Pt improving activity tolerance and endurance daily.  Individual therapy   Chad [name] 07/07/2011, 2:22 PM

## 2011-07-07 NOTE — Progress Notes (Signed)
Physical Therapy Note  Patient Details  Name: Chad [name] MRN: [id number] Date of Birth: 20-Apr-1955 Today's Date: 07/07/2011  Time: 1100-1156 56 minutes  No c/o pain.  Treatment focus on functional sit to stand transfers from various pieces of furniture including couch, high back chair, recliner chair.  Pt able to perform from low, cushioned surface initially with min A, progressed to mod A with fatigue.  Discussed with pt home set up and tips for raising furniture, having assistance at home.  Pt verbalizes understanding.  Gait training with RW with supervision in home and controlled environments.  Pt continues to improve activity tolerance and was able to stand from w/c multiple attempts with supervision.  Individual therapy   Shelisha Gautier 07/07/2011, 12:19 PM

## 2011-07-07 NOTE — Progress Notes (Signed)
Subjective/Complaints: No complaints of SOB or CP. Motivated.  Stamina better. Continues to make urine.   Review of Systems  Respiratory: Negative for cough and shortness of breath.   Cardiovascular: Positive for leg swelling.       Chronic dorsum of L foot  Gastrointestinal: Negative.   Genitourinary:       Good output  Neurological: Positive for focal weakness and weakness.       Both legs weak  All other systems reviewed and are negative.    Objective: Vital Signs: Blood pressure 135/73, pulse 61, temperature 98.5 F (36.9 C), temperature source Oral, resp. rate 18, height 6' (1.829 m), weight 117.119 kg (258 lb 3.2 oz), SpO2 96.00%. No results found. Results for orders placed during the hospital encounter of 06/27/11 (from the past 72 hour(s))  RENAL FUNCTION PANEL     Status: Abnormal   Collection Time   07/06/11  6:35 AM      Component Value Range Comment   Sodium 135  135 - 145 (mEq/L)    Potassium 3.2 (*) 3.5 - 5.1 (mEq/L)    Chloride 99  96 - 112 (mEq/L)    CO2 21  19 - 32 (mEq/L)    Glucose, Bld 74  70 - 99 (mg/dL)    BUN 73 (*) 6 - 23 (mg/dL)    Creatinine, Ser 1.19 (*) 0.50 - 1.35 (mg/dL)    Calcium 8.2 (*) 8.4 - 10.5 (mg/dL)    Phosphorus 5.5 (*) 2.3 - 4.6 (mg/dL)    Albumin 2.1 (*) 3.5 - 5.2 (g/dL)    GFR calc non Af Amer 12 (*) >90 (mL/min)    GFR calc Af Amer 14 (*) >90 (mL/min)   BASIC METABOLIC PANEL     Status: Abnormal   Collection Time   07/07/11  7:13 AM      Component Value Range Comment   Sodium 134 (*) 135 - 145 (mEq/L)    Potassium 3.2 (*) 3.5 - 5.1 (mEq/L)    Chloride 98  96 - 112 (mEq/L)    CO2 21  19 - 32 (mEq/L)    Glucose, Bld 79  70 - 99 (mg/dL)    BUN 72 (*) 6 - 23 (mg/dL)    Creatinine, Ser 1.47 (*) 0.50 - 1.35 (mg/dL)    Calcium 8.4  8.4 - 10.5 (mg/dL)    GFR calc non Af Amer 12 (*) >90 (mL/min)    GFR calc Af Amer 14 (*) >90 (mL/min)     HEENT: EOMI, PERRL Heart: RRR, No M,R,G Abdomen: bs+, non-tender. Edema 2+ on either limb.  Wound on back of left calf with less fibronecrotic material. Fluctuant blister in skin folds on LLE. Trach stoma pink with granulation and still open but closing.. No distress. Chest remains clear.  Good insight, awareness, memory/ Motor 4/5 in BUE 2-/5 in B HF,KE, Ankle DF/PF Assessment/Plan: 1. Functional deficits secondary to critical illness myopathy which require 3+ hours per day of interdisciplinary therapy in a comprehensive inpatient rehab setting. Physiatrist is providing close team supervision and 24 hour management of active medical problems listed below. Physiatrist and rehab team continue to assess barriers to discharge/monitor patient progress toward functional and medical goals. FIM: FIM - Bathing Bathing Steps Patient Completed: Chest;Right Arm;Left Arm;Abdomen;Front perineal area;Buttocks;Right upper leg;Left upper leg;Left lower leg (including foot) Bathing: 4: Steadying assist  FIM - Upper Body Dressing/Undressing Upper body dressing/undressing steps patient completed: Thread/unthread left sleeve of pullover shirt/dress;Thread/unthread right sleeve of pullover shirt/dresss;Put head through  opening of pull over shirt/dress;Pull shirt over trunk Upper body dressing/undressing: 7: Complete Independence: No helper FIM - Lower Body Dressing/Undressing Lower body dressing/undressing steps patient completed: Thread/unthread right pants leg;Thread/unthread left pants leg;Pull pants up/down Lower body dressing/undressing: 4: Steadying Assist  FIM - Toileting Toileting steps completed by patient: Adjust clothing prior to toileting;Performs perineal hygiene;Adjust clothing after toileting Toileting Assistive Devices: Grab bar or rail for support Toileting: 4: Steadying assist  FIM - Diplomatic Services operational officer Devices: Elevated toilet seat;Walker Toilet Transfers: 4-To toilet/BSC: Min A (steadying Pt. > 75%);4-From toilet/BSC: Min A (steadying Pt. > 75%)  FIM -  Bed/Chair Transfer Bed/Chair Transfer Assistive Devices: Bed rails Bed/Chair Transfer: 4: Bed > Chair or W/C: Min A (steadying Pt. > 75%);7: Supine > Sit: No assist  FIM - Locomotion: Wheelchair Distance: 20' Locomotion: Wheelchair: 1: Total Assistance/staff pushes wheelchair (Pt<25%) FIM - Locomotion: Ambulation Locomotion: Ambulation Assistive Devices: Designer, industrial/product Ambulation/Gait Assistance: 5: Supervision Locomotion: Ambulation: 5: Travels 150 ft or more with supervision/safety issues  Comprehension Comprehension Mode: Auditory Comprehension: 7-Follows complex conversation/direction: With no assist  Expression Expression Mode: Verbal Expression Assistive Devices: 6-Talk trach valve Expression: 7-Expresses complex ideas: With no assist  Social Interaction Social Interaction: 7-Interacts appropriately with others - No medications needed.  Problem Solving Problem Solving: 7-Solves complex problems: Recognizes & self-corrects  Memory Memory: 7-Complete Independence: No helper  Medical Problem List and Plan:  1. DVT Prophylaxis/Anticoagulation: Mechanical: Sequential compression devices, below knee Bilateral lower extremities-ambulating 2. Pain Management: Continue hydrocodone prn  3. Mood: Monitor for now. Seems very motivated.  4. Renal transplant with chronic allograft nephropathy: continue HD on prn basis per nephrology. Fluid overload improving. Strict I and O. Continue prednisone and sirolimus. Po lasix initiated. Making more urine. Cr's seem to be holding from 4.9 to 5.0 5. Left calf ulcer- continue collagegase dressing with packing 6. Afib: Continue amiodarone. ?coumadin per cardiology--discuss next week. 7. Anemia of chronic disease:  Continue aranesp weekly. 8. Pulmonary- trach out and no issues whatsoever. Continue occlusive dressing. 9. Hypokalemia:  To be supplemented.   LOS (Days) 10 A FACE TO FACE EVALUATION WAS PERFORMED  Chad [name] 07/07/2011, 8:57  AM

## 2011-07-07 NOTE — Progress Notes (Signed)
Occupational Therapy Session Note  Patient Details  Name: Chad [name] MRN: [id number] Date of Birth: 1955-02-05  Today's Date: 07/07/2011 Time: 0730-0830 Time Calculation (min): 60 min  Short Term Goals: Week 2:  OT Short Term Goal 1 (Week 2): Pt will perform bed mobility mod I in prep for ADL OT Short Term Goal 2 (Week 2): Pt will perform toilet transfers with min A  OT Short Term Goal 3 (Week 2): Pt will dress LB sit to stand with close supervision  OT Short Term Goal 4 (Week 2): Pt will obtain clothing from drawers with RW in standing with supervision   Skilled Therapeutic Interventions/Progress Updates:    1:1 self care retraining: pt eating breakfast EOB when arrived. Sit to stand from bed with min A to ambulate with close supervision to bathroom for toileting and toilet transfer. Focused session on sit to stands, standing balance and tolerance, activity tolerance, ability to bathe and dress LB with rest breaks for energy conservation.  Therapy Documentation Precautions:  Precautions Precautions: Fall Restrictions Weight Bearing Restrictions: No Vital Signs: Therapy Vitals Temp: 98.5 F (36.9 C) Temp src: Oral Pulse Rate: 61  Resp: 18  BP: 135/73 mmHg Patient Position, if appropriate: Lying Oxygen Therapy SpO2: 96 % O2 Device: None (Room air) Pain:  no c/o pain  See FIM for current functional status  Therapy/Group: Individual Therapy  Roney Mans [facility] 07/07/2011, 8:22 AM

## 2011-07-07 NOTE — Evaluation (Signed)
Speech Language Pathology Discharge of Services  Patient Details  Name: Chad [name] MRN: [id number] Date of Birth: 14-Sep-1955  Today's Date: 07/07/2011 Time: 1030-1100 Time Calculation (min): 30 min  Problem List:  Patient Active Problem List  Diagnoses  . HCAP (healthcare-associated pneumonia)  . Thrombocytopenia  . Acute respiratory failure  . ARDS (adult respiratory distress syndrome)  . Renal transplant disorder  . Acute systolic heart failure  . Septic shock  . Acute renal failure  . Cardiomyopathy  . Critical illness myopathy   Past Medical History:  Past Medical History  Diagnosis Date  . Hypertension   . H/O kidney transplant   . Hepatitis   . Chronic kidney disease    Past Surgical History:  Past Surgical History  Procedure Date  . Nephrectomy transplanted organ   . Insertion of dialysis catheter 06/20/2011    Procedure: INSERTION OF DIALYSIS CATHETER;  Surgeon: [name], MD;  Location: [facility] OR;  Service: Vascular;  Laterality: Right;  Ultrasound guided insertion of right internal jugular dialysis catheter  . Multiple extractions with alveoloplasty 06/27/2011    Procedure: MULTIPLE EXTRACION WITH ALVEOLOPLASTY;  Surgeon: [name], DDS;  Location: [facility] OR;  Service: Oral Surgery;  Laterality: N/A;  Extraction  of tooth # 14 with alveoloplasty    Assessment / Plan / Recommendation Clinical Impression  Chad [name] has made quick, functional progress in the areas of communication and swallowing.  Patient is now decannulated and independent with verbal communication.  His swallow function has presumably improved given no worsening pulmonary issues.  Although patient's aspiration events on last MBSS were of a silent nature, patient's overall functional improvements and conditioning are significant enough that if he were to have periodic, minimal silent aspiration episodes, he would easily tolerate this without any likely pulmonary complications.  Following  education of this information, Chad [name] has chosen to continue using the chin tuck postural airway protection strategy until his discharge home.      SLP Assessment  Patient does not need any further Speech Lanaguage Pathology Services    Recommendations  Follow up Recommendations: None     Pain Pain Assessment Pain Assessment: No/denies pain  Short Term Goals: Week 1:  SLP Short Term Goal 1 (Week 1): Patient will wear PMSV during all waking hours without any changes in VS and no assistance. SLP Short Term Goal 1 - Progress (Week 1): Met SLP Short Term Goal 2 (Week 1): Patient will donn/doff PMSV as a modified independent SLP Short Term Goal 2 - Progress (Week 1): Met SLP Short Term Goal 3 (Week 1): Patient will utilize airway protection compensatory strategy of a chin tuck as a modified independent. SLP Short Term Goal 3 - Progress (Week 1): Met  See FIM for current functional status Refer to Care Plan for Long Term Goals  Recommendations for other services: None  The above assessment, treatment plan, treatment alternatives and goals were discussed and mutually agreed upon: by patient  Myra Rude, M.S.,CCC-SLP Pager [phone] 07/07/2011, 11:11 AM

## 2011-07-07 NOTE — Progress Notes (Signed)
Speech Language Pathology Weekly Progress Note  Patient Details  Name: Chad [name] MRN: [id number] Date of Birth: Dec 05, 1955  Today's Date: 07/07/2011 Time: 1030-1100 Time Calculation (min): 30 min  Short Term Goals: Week 2:  n/a  Weekly Progress Updates: Patient has met all goals. See separate discharge note and today's treatment session.  No further skilled goals.  Myra Rude, M.S.,CCC-SLP Pager [phone] 07/07/2011, 11:07 AM

## 2011-07-07 NOTE — Progress Notes (Signed)
Patient ID: Chad [name], male   DOB: 1955/12/26, 56 y.o.   MRN: [id number]   [facility] Progress Note    Subjective:   Reports to be doing better and denies any CP/SOB. Leg edema stable.   Objective:   BP 135/73  Pulse 61  Temp(Src) 98.5 F (36.9 C) (Oral)  Resp 18  Ht 6' (1.829 m)  Wt 117.119 kg (258 lb 3.2 oz)  BMI 35.02 kg/m2  SpO2 96%  Intake/Output Summary (Last 24 hours) at 07/07/11 0844 Last data filed at 07/07/11 0820  Gross per 24 hour  Intake   1320 ml  Output   1325 ml  Net     -5 ml   Weight change:   Physical Exam: ION:GEXBMWUXLKG up in W/C reading paper MWN:UUVOZ RRR, normal S1 and S2  Resp:Coarse BS bilaterally, no rales/rhonchi DGU:YQIH, obese, NT, BS normal KVQ:QVZDGLOVF LEs in ACE wraps+ pitting 2+ edema  Imaging: No results found.  Labs: BMET  Lab 07/07/11 0713 07/06/11 6433 07/04/11 0620 07/03/11 0630 07/02/11 0645 07/01/11 0650  NA 134* 135 133* 131* 131* 132*  K 3.2* 3.2* 3.4* 3.5 3.6 4.0  CL 98 99 98 95* 96 95*  CO2 21 21 21 21 21 22   GLUCOSE 79 74 75 83 84 72  BUN 72* 73* 69* 68* 67* 63*  CREATININE 4.83* 5.08* 4.99* 4.87* 4.96* 4.72*  ALB -- -- -- -- -- --  CALCIUM 8.4 8.2* 8.3* 8.1* 8.4 8.5  PHOS -- 5.5* 6.0* 5.5* 5.2* 4.8*   CBC  Lab 07/03/11 0630  WBC 10.2  NEUTROABS --  HGB 8.3*  HCT 26.3*  MCV 102.7*  PLT 201    Medications:      . amiodarone  200 mg Oral Daily  . antiseptic oral rinse  15 mL Mouth Rinse QID  . calcitRIOL  0.25 mcg Oral Daily  . chlorhexidine  15 mL Mouth Rinse BID  . collagenase   Topical Daily  . darbepoetin (ARANESP) injection - NON-DIALYSIS  100 mcg Subcutaneous Q Thu-1800  . pantoprazole  40 mg Oral BID AC  . potassium chloride  10 mEq Oral Daily  . predniSONE  10 mg Oral Q breakfast  . sirolimus  1 mg Oral Daily  . DISCONTD: furosemide  80 mg Oral BID     Assessment/ Plan:   1. 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 Had CRRT 5/13-5/26, then  one regular HD on 5/30 which was his last dialysis. UOP continues to improve ( yest) with some improvement in Scr- no acute HD needs noted today. Has tunneled HD cath R IJ in place for use as needed. Sirolimus level pending to assess effect of dose uptitration. 2. PNA, resolving clinically- now off ABX 3. Anemia on Aranesp  4. Sec HPTH: on Calcitriol 5. Hypokalemia: will supplement PO   Zetta Bills, MD 07/07/2011, 8:44 AM

## 2011-07-07 NOTE — Progress Notes (Signed)
Speech Language Pathology Daily Session Note  Patient Details  Name: ELHADJ GIRTON MRN: [id number] Date of Birth: 01-10-1956  Today's Date: 07/07/2011 Time: 1030-1100 Time Calculation (min): 30 min  Short Term Goals: Week 1:  SLP Short Term Goal 3 (Week 1): Patient will utilize airway protection compensatory strategy of a chin tuck as a modified independent.  Skilled Therapeutic Interventions: Treatment focused on swallow recovery goals.  Patient continues to independently utilize a chin tuck postural compensation for airway protection with thin liquid sips.  Reviewed patient's course of recovery with him and shared that a repeat MBSS is not necessary at this time as his sensory deficits have likely resolved or will do so within the next 1-2 weeks.  Also, if he were to have any silent aspiration events, he will likely tolerate it given his overall improved conditioning and ability to ambulate.  Patient stated that he will continue using the chin tuck posture until discharge from hospital as an ongoing protective mechanism.  Education completed with patient and all goals met.  See separate discharge note.   FIM:  Comprehension Comprehension Mode: Auditory Comprehension: 6-Follows complex conversation/direction: With extra time/assistive device Expression Expression Mode: Verbal Expression: 7-Expresses complex ideas: With no assist Social Interaction Social Interaction: 7-Interacts appropriately with others - No medications needed. Problem Solving Problem Solving: 7-Solves complex problems: Recognizes & self-corrects Memory Memory: 7-Complete Independence: No helper FIM - Eating Eating Activity: 7: Complete independence:no helper  Pain Pain Assessment Pain Assessment: No/denies pain  Therapy/Group: Individual Therapy  Myra Rude, M.S.,CCC-SLP Pager [phone] 07/07/2011, 11:04 AM

## 2011-07-08 DIAGNOSIS — G7281 Critical illness myopathy: Secondary | ICD-10-CM

## 2011-07-08 DIAGNOSIS — Z5189 Encounter for other specified aftercare: Secondary | ICD-10-CM

## 2011-07-08 LAB — BASIC METABOLIC PANEL
CO2: 21 mEq/L (ref 19–32)
Calcium: 8.5 mg/dL (ref 8.4–10.5)
Chloride: 101 mEq/L (ref 96–112)
Glucose, Bld: 87 mg/dL (ref 70–99)
Potassium: 3.7 mEq/L (ref 3.5–5.1)
Sodium: 137 mEq/L (ref 135–145)

## 2011-07-08 LAB — SIROLIMUS LEVEL: Sirolimus (Rapamycin): 4.1 ng/mL

## 2011-07-08 MED ORDER — SIROLIMUS 1 MG/ML PO SOLN
1.5000 mg | Freq: Every day | ORAL | Status: DC
  Administered 2011-07-09 – 2011-07-16 (×8): 1.5 mg via ORAL
  Filled 2011-07-08 (×4): qty 1.5

## 2011-07-08 MED ORDER — SIROLIMUS 1 MG PO TABS
1.5000 mg | ORAL_TABLET | Freq: Every day | ORAL | Status: DC
  Filled 2011-07-08: qty 2

## 2011-07-08 NOTE — Progress Notes (Signed)
Physical Therapy Note  Patient Details  Name: Chad [name] MRN: [id number] Date of Birth: [birth year] Today's Date: 07/08/2011  Time 1: 830-927 57 minutes  No c/o pain.  Car transfer training to simulated sedan and SUV height.  Pt required cuing but was able to perform full transfer with supervision.  Discussed with pt having his son bring pt's car to practice, pt agreeable.  Stair training 2 x 5 stairs with B handrails, supervision.  Pt required frequent standing rest breaks during stair climbing but is able to self monitor and rest when needed.  Pt gait with RW around unit > 150' multiple attempts with standing rest breaks less frequently.  Overall improved activity tolerance.  Time 2: 1300-1330 30 minutes  No c/o pain.  Treatment session focused on gait with RW vs rollator.  Pt prefers rollator for use in community settings for energy conservation and being able to walk further distances without worrying if he needs to rest.  Pt gait with rollator >150' multiple attempts and performed ramp/curb with rollator all with supervision assist.  Discussed with pt possible community outing to try rollator in true community environment, pt agreeable.  Individual therapy   Chad [name] 07/08/2011, 9:27 AM

## 2011-07-08 NOTE — Progress Notes (Addendum)
Patient ID: Chad [name], male   DOB: [birth year], 56 y.o.   MRN: [id number]   [facility] Progress Note    Subjective:   Reports to be feeling fair and actively participating in rehab efforts.   Objective:   BP 145/87  Pulse 87  Temp(Src) 98.8 F (37.1 C) (Oral)  Resp 18  Ht 6' (1.829 m)  Wt 118.8 kg (261 lb 14.5 oz)  BMI 35.52 kg/m2  SpO2 96%  Intake/Output Summary (Last 24 hours) at 07/08/11 0920 Last data filed at 07/08/11 0900  Gross per 24 hour  Intake    600 ml  Output   1050 ml  Net   -450 ml   Weight change: 1.681 kg (3 lb 11.3 oz)  Physical Exam: [name]:NFAOZHYQMVH ambulating in hallway with rolling walker QIO:NGEXB RRR, s1 and S2 with ESM Resp:CTA bilaterally, no rales/wheeze MWU:XLKG, obese, NT, BS normal MWN:UUVOZDGUY LEs wrapped over chronic lymphedema  Imaging: No results found.  Labs: BMET  Lab 07/08/11 0640 07/07/11 0713 07/06/11 0635 07/04/11 0620 07/03/11 0630 07/02/11 0645  NA 137 134* 135 133* 131* 131*  K 3.7 3.2* 3.2* 3.4* 3.5 3.6  CL 101 98 99 98 95* 96  CO2 21 21 21 21 21 21   GLUCOSE 87 79 74 75 83 84  BUN 72* 72* 73* 69* 68* 67*  CREATININE 4.58* 4.83* 5.08* 4.99* 4.87* 4.96*  ALB -- -- -- -- -- --  CALCIUM 8.5 8.4 8.2* 8.3* 8.1* 8.4  PHOS -- -- 5.5* 6.0* 5.5* 5.2*   CBC  Lab 07/03/11 0630  WBC 10.2  NEUTROABS --  HGB 8.3*  HCT 26.3*  MCV 102.7*  PLT 201    Medications:      . amiodarone  200 mg Oral Daily  . antiseptic oral rinse  15 mL Mouth Rinse QID  . calcitRIOL  0.25 mcg Oral Daily  . chlorhexidine  15 mL Mouth Rinse BID  . collagenase   Topical Daily  . darbepoetin (ARANESP) injection - NON-DIALYSIS  100 mcg Subcutaneous Q Thu-1800  . pantoprazole  40 mg Oral BID AC  . potassium chloride  10 mEq Oral Daily  . potassium chloride  40 mEq Oral Once  . predniSONE  10 mg Oral Q breakfast  . sirolimus  1 mg Oral Daily     Assessment/ Plan:   1. 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 Had CRRT 5/13-5/26, then one regular HD on 5/30 which was his last dialysis. Some improvement in Scr and fair UOP- no acute HD needs noted today. Has tunneled HD cath R IJ in place for use as needed. Sirolimus level 4.1- the target level is supposed to be 7-9 based on post-transplant recommendations, will increase dose to 1.5 mg PO QD and recheck a level in AM of 6/14.  2. PNA, resolving clinically- now off ABX  3. Anemia on Aranesp  4. Sec HPTH: on Calcitriol  5. Hypokalemia: corrected by PO supplements   Zetta Bills, MD 07/08/2011, 9:20 AM

## 2011-07-08 NOTE — Progress Notes (Signed)
Patient ID: Chad [name], male   DOB: 07-21-1955, 56 y.o.   MRN: [id number] Subjective/Complaints: No complaints of SOB or CP. Motivated.  Stamina better. Continues to make urine.  Appreciate renal consultant Review of Systems  Respiratory: Negative for cough and shortness of breath.   Cardiovascular: Positive for leg swelling.       Chronic dorsum of L foot  Gastrointestinal: Negative.   Genitourinary:       Good output  Neurological: Positive for focal weakness and weakness.       Both legs weak  All other systems reviewed and are negative.    Objective: Vital Signs: Blood pressure 145/87, pulse 87, temperature 98.8 F (37.1 C), temperature source Oral, resp. rate 18, height 6' (1.829 m), weight 118.8 kg (261 lb 14.5 oz), SpO2 96.00%. No results found. Results for orders placed during the hospital encounter of 06/27/11 (from the past 72 hour(s))  RENAL FUNCTION PANEL     Status: Abnormal   Collection Time   07/06/11  6:35 AM      Component Value Range Comment   Sodium 135  135 - 145 (mEq/L)    Potassium 3.2 (*) 3.5 - 5.1 (mEq/L)    Chloride 99  96 - 112 (mEq/L)    CO2 21  19 - 32 (mEq/L)    Glucose, Bld 74  70 - 99 (mg/dL)    BUN 73 (*) 6 - 23 (mg/dL)    Creatinine, Ser 4.09 (*) 0.50 - 1.35 (mg/dL)    Calcium 8.2 (*) 8.4 - 10.5 (mg/dL)    Phosphorus 5.5 (*) 2.3 - 4.6 (mg/dL)    Albumin 2.1 (*) 3.5 - 5.2 (g/dL)    GFR calc non Af Amer 12 (*) >90 (mL/min)    GFR calc Af Amer 14 (*) >90 (mL/min)   BASIC METABOLIC PANEL     Status: Abnormal   Collection Time   07/07/11  7:13 AM      Component Value Range Comment   Sodium 134 (*) 135 - 145 (mEq/L)    Potassium 3.2 (*) 3.5 - 5.1 (mEq/L)    Chloride 98  96 - 112 (mEq/L)    CO2 21  19 - 32 (mEq/L)    Glucose, Bld 79  70 - 99 (mg/dL)    BUN 72 (*) 6 - 23 (mg/dL)    Creatinine, Ser 8.11 (*) 0.50 - 1.35 (mg/dL)    Calcium 8.4  8.4 - 10.5 (mg/dL)    GFR calc non Af Amer 12 (*) >90 (mL/min)    GFR calc Af Amer 14 (*) >90  (mL/min)   BASIC METABOLIC PANEL     Status: Abnormal   Collection Time   07/08/11  6:40 AM      Component Value Range Comment   Sodium 137  135 - 145 (mEq/L)    Potassium 3.7  3.5 - 5.1 (mEq/L)    Chloride 101  96 - 112 (mEq/L)    CO2 21  19 - 32 (mEq/L)    Glucose, Bld 87  70 - 99 (mg/dL)    BUN 72 (*) 6 - 23 (mg/dL)    Creatinine, Ser 9.14 (*) 0.50 - 1.35 (mg/dL)    Calcium 8.5  8.4 - 10.5 (mg/dL)    GFR calc non Af Amer 13 (*) >90 (mL/min)    GFR calc Af Amer 15 (*) >90 (mL/min)     HEENT: EOMI, PERRL Heart: RRR, No M,R,G Abdomen: bs+, non-tender. Edema 2+ on either limb. Wound on  back of left calf with less fibronecrotic material. Fluctuant blister in skin folds on LLE. Trach stoma pink with granulation and still open but closing.. No distress. Chest remains clear.  Good insight, awareness, memory/ Motor 4/5 in BUE 2-/5 in B HF,KE, Ankle DF/PF Assessment/Plan: 1. Functional deficits secondary to critical illness myopathy which require 3+ hours per day of interdisciplinary therapy in a comprehensive inpatient rehab setting. Physiatrist is providing close team supervision and 24 hour management of active medical problems listed below. Physiatrist and rehab team continue to assess barriers to discharge/monitor patient progress toward functional and medical goals. FIM: FIM - Bathing Bathing Steps Patient Completed: Chest;Right Arm;Left Arm;Abdomen;Front perineal area;Buttocks;Right upper leg;Left upper leg;Left lower leg (including foot) Bathing: 4: Steadying assist  FIM - Upper Body Dressing/Undressing Upper body dressing/undressing steps patient completed: Thread/unthread left sleeve of pullover shirt/dress;Thread/unthread right sleeve of pullover shirt/dresss;Put head through opening of pull over shirt/dress;Pull shirt over trunk Upper body dressing/undressing: 7: Complete Independence: No helper FIM - Lower Body Dressing/Undressing Lower body dressing/undressing steps patient  completed: Thread/unthread right pants leg;Thread/unthread left pants leg;Pull pants up/down Lower body dressing/undressing: 4: Steadying Assist  FIM - Toileting Toileting steps completed by patient: Adjust clothing prior to toileting;Performs perineal hygiene;Adjust clothing after toileting Toileting Assistive Devices: Grab bar or rail for support Toileting: 4: Steadying assist  FIM - Diplomatic Services operational officer Devices: Elevated toilet seat;Walker Toilet Transfers: 4-To toilet/BSC: Min A (steadying Pt. > 75%);4-From toilet/BSC: Min A (steadying Pt. > 75%)  FIM - Bed/Chair Transfer Bed/Chair Transfer Assistive Devices: Bed rails Bed/Chair Transfer: 4: Bed > Chair or W/C: Min A (steadying Pt. > 75%);4: Chair or W/C > Bed: Min A (steadying Pt. > 75%)  FIM - Locomotion: Wheelchair Distance: 20' Locomotion: Wheelchair: 1: Total Assistance/staff pushes wheelchair (Pt<25%) FIM - Locomotion: Ambulation Locomotion: Ambulation Assistive Devices: Designer, industrial/product Ambulation/Gait Assistance: 5: Supervision Locomotion: Ambulation: 5: Travels 150 ft or more with supervision/safety issues  Comprehension Comprehension Mode: Auditory Comprehension: 6-Follows complex conversation/direction: With extra time/assistive device  Expression Expression Mode: Verbal Expression Assistive Devices: 6-Talk trach valve Expression: 7-Expresses complex ideas: With no assist  Social Interaction Social Interaction: 7-Interacts appropriately with others - No medications needed.  Problem Solving Problem Solving: 7-Solves complex problems: Recognizes & self-corrects  Memory Memory: 7-Complete Independence: No helper  Medical Problem List and Plan:  1. DVT Prophylaxis/Anticoagulation: Mechanical: Sequential compression devices, below knee Bilateral lower extremities-ambulating 2. Pain Management: Continue hydrocodone prn  3. Mood: Monitor for now. Seems very motivated.  4. Renal transplant  with chronic allograft nephropathy: continue HD on prn basis per nephrology. Fluid overload improving. Strict I and O. Continue prednisone and sirolimus. Po lasix initiated. Making more urine. Cr's seem to be holding from 4.9 to 5.0 5. Left calf ulcer- continue collagegase dressing with packing 6. Afib: Continue amiodarone. ?coumadin per cardiology--discuss next week. 7. Anemia of chronic disease:  Continue aranesp weekly. 8. Pulmonary- trach out and no issues whatsoever. Continue occlusive dressing. 9. Hypokalemia:  To be supplemented.   LOS (Days) 11 A FACE TO FACE EVALUATION WAS PERFORMED  Evoleth Nordmeyer E 07/08/2011, 9:27 AM

## 2011-07-08 NOTE — Progress Notes (Signed)
Occupational Therapy Session Note  Patient Details  Name: Chad [name] MRN: [id number] Date of Birth: 06-18-1955  Today's Date: 07/08/2011 Time: 1115-1200 Time Calculation (min): 45 min  Short Term Goals: Week 2:  OT Short Term Goal 1 (Week 2): Pt will perform bed mobility mod I in prep for ADL OT Short Term Goal 2 (Week 2): Pt will perform toilet transfers with min A  OT Short Term Goal 3 (Week 2): Pt will dress LB sit to stand with close supervision  OT Short Term Goal 4 (Week 2): Pt will obtain clothing from drawers with RW in standing with supervision   Skilled Therapeutic Interventions/Progress Updates:    1:1 focus on functional ambulation from room to laundry room with RW, performed laundry in standing with supervision for safety (incase needed to sit), functional ambulation from laundry room down to 4100 with out RW focusing on activity tolerance, dynamic balance, balance reactions etc with rest breaks (standing pauses in the hallway holding on to chair arm on wall) and then able to ambulated back to elevators for a break sitting, got RW and ambulated back to room with supervision  Therapy Documentation Precautions:  Precautions Precautions: Fall Restrictions Weight Bearing Restrictions: No Pain:  no c/o pain  See FIM for current functional status  Therapy/Group: Individual Therapy  Roney Mans [facility] 07/08/2011, 12:12 PM

## 2011-07-08 NOTE — Progress Notes (Signed)
Occupational Therapy Session Note  Patient Details  Name: MARQUS MACPHEE MRN: [id number] Date of Birth: Jan 22, 1956  Today's Date: 07/08/2011 Time: 0730-0830 Time Calculation (min): 60 min  Short Term Goals: Week 2:  OT Short Term Goal 1 (Week 2): Pt will perform bed mobility mod I in prep for ADL OT Short Term Goal 2 (Week 2): Pt will perform toilet transfers with min A  OT Short Term Goal 3 (Week 2): Pt will dress LB sit to stand with close supervision  OT Short Term Goal 4 (Week 2): Pt will obtain clothing from drawers with RW in standing with supervision   Skilled Therapeutic Interventions/Progress Updates:    1:1 self care retraining: bathing and dressing at sink level: focus on activity tolerance, standing tolerance for grooming tasks at sink, and clothing management, functional ambulation with RW with close supervision. Pt attempted to thread pants in standing but caused increase fatigue so recommending sitting to thread for energy conservation. Pt requiring min A to perform sit to stands from elevated 3:1 and w/c.   Therapy Documentation Precautions:  Precautions Precautions: Fall Restrictions Weight Bearing Restrictions: No Pain:  no c/o pain  See FIM for current functional status  Therapy/Group: Individual Therapy  Roney Mans [facility] 07/08/2011, 9:36 AM

## 2011-07-08 NOTE — Consult Note (Signed)
WOC follow-up consult Note:  Follow up for lower extremity wounds and lymphedema. Left lower extremity with venous stasis ulcer that measures 2.0 x 2.0 x 0.2 cm.    15% yellow slough to 12 o'clock area  and 85% red granulation tissue.  Small amount of serous drainage noted on previous dressing.    Bullae noted below below wound.  Bullae open as tegaderm removed.  Measure 2.0 x 7.0 cm.  Skin intact over wound bed.  Large amount of serous drainage leaked from area.  No complaints of pain and tolerated dressing change well.    Treatment:  Applied santyl to 12 o'clock area of slough for enzymatic debridement.  Covered with foam dressing for absorption.  Foam dressing applied to now open bullae for absorption of drainage.  Covered with ACE for light compression.  When discharged will not require use of Santy, wound is progressing nicely.  Foam dressing when discharged home to absorb drainage and promote healing.      Nefeteria Jeter RN, BSN, WOC Nurse/Will not plan to follow further unless re-consulted.  [address] RN, MSN, [employer]  [phone]

## 2011-07-09 LAB — BASIC METABOLIC PANEL
BUN: 74 mg/dL — ABNORMAL HIGH (ref 6–23)
CO2: 22 mEq/L (ref 19–32)
Chloride: 101 mEq/L (ref 96–112)
Glucose, Bld: 96 mg/dL (ref 70–99)
Potassium: 4.1 mEq/L (ref 3.5–5.1)

## 2011-07-09 NOTE — Progress Notes (Addendum)
Patient ID: DAETON KLUTH, male   DOB: Jul 13, 1955, 56 y.o.   MRN: [id number]   [facility] Progress Note    Subjective:   Comfortable OOB in wheelchair- denies any emerging complaints   Objective:   BP 135/79  Pulse 79  Temp 98.3 F (36.8 C) (Oral)  Resp 20  Ht 6' (1.829 m)  Wt 116.7 kg (257 lb 4.4 oz)  BMI 34.89 kg/m2  SpO2 98%  Intake/Output Summary (Last 24 hours) at 07/09/11 0944 Last data filed at 07/09/11 0849  Gross per 24 hour  Intake    480 ml  Output    800 ml  Net   -320 ml   Weight change: -2.1 kg (-4 lb 10.1 oz)  Physical Exam: JXB:JYNWGNFAOZH in w/chair YQM:VHQIO RRR, normal S1 and s2  Resp:Coarse/transmitted BS bilaterally- no rales/wheeze  NGE:XBMW, obese, NT, BS normal UXL:KGMWNUU lymphedema+pitting edema- legs in wraps  Imaging: No results found.  Labs: BMET  Lab 07/09/11 0640 07/08/11 0640 07/07/11 0713 07/06/11 0635 07/04/11 0620 07/03/11 0630  NA 135 137 134* 135 133* 131*  K 4.1 3.7 3.2* 3.2* 3.4* 3.5  CL 101 101 98 99 98 95*  CO2 22 21 21 21 21 21   GLUCOSE 96 87 79 74 75 83  BUN 74* 72* 72* 73* 69* 68*  CREATININE 4.83* 4.58* 4.83* 5.08* 4.99* 4.87*  ALB -- -- -- -- -- --  CALCIUM 8.5 8.5 8.4 8.2* 8.3* 8.1*  PHOS -- -- -- 5.5* 6.0* 5.5*   CBC  Lab 07/03/11 0630  WBC 10.2  NEUTROABS --  HGB 8.3*  HCT 26.3*  MCV 102.7*  PLT 201    Medications:      . amiodarone  200 mg Oral Daily  . antiseptic oral rinse  15 mL Mouth Rinse QID  . calcitRIOL  0.25 mcg Oral Daily  . chlorhexidine  15 mL Mouth Rinse BID  . collagenase   Topical Daily  . darbepoetin (ARANESP) injection - NON-DIALYSIS  100 mcg Subcutaneous Q Thu-1800  . pantoprazole  40 mg Oral BID AC  . potassium chloride  10 mEq Oral Daily  . predniSONE  10 mg Oral Q breakfast  . sirolimus  1.5 mg Oral Daily  . DISCONTD: sirolimus  1 mg Oral Daily  . DISCONTD: sirolimus  1.5 mg Oral Daily     Assessment/ Plan:   1. 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 Had CRRT 5/13-5/26, then one regular HD on 5/30 which was his last dialysis. Some fluctuations in Scr and fair UOP (not accurately charted)- no acute HD needs noted today. Has tunneled HD cath R IJ in place for use as needed. Sirolimus level 4.1- (target level is 7-9), increased dose to 1.5 mg yesterday and levels to be checked friday 2. PNA, resolved clinically- now off ABX  3. Anemia on Aranesp- check CBC and iron panel tomorrow  4. Sec HPTH: on Calcitriol  5. Hypokalemia: corrected by PO supplements   Zetta Bills, MD 07/09/2011, 9:44 AM

## 2011-07-09 NOTE — Progress Notes (Signed)
Occupational Therapy Session Note  Patient Details  Name: Chad [name] MRN: [id number] Date of Birth: 19-Aug-1955  Today's Date: 07/09/2011 Time: 0730-0830 Time Calculation (min): 60 min  Short Term Goals: Week 2:  OT Short Term Goal 1 (Week 2): Pt will perform bed mobility mod I in prep for ADL OT Short Term Goal 2 (Week 2): Pt will perform toilet transfers with min A  OT Short Term Goal 3 (Week 2): Pt will dress LB sit to stand with close supervision  OT Short Term Goal 4 (Week 2): Pt will obtain clothing from drawers with RW in standing with supervision   Skilled Therapeutic Interventions/Progress Updates:    1:1 self care retraining at sink level. Focus on dynamic balance with short distance functional ambulation without RW, sit to stand from regular chair during bathing. Able to tolerate standing for UB and peri-area bathing. Sit to stands from regular chair with mod A. Increased activity tolerance through out session. Still needed min cuing for placing finger over trach site to avoid air coming through.  Therapy Documentation Precautions:  Precautions Precautions: Fall Restrictions Weight Bearing Restrictions: No Vital Signs: Therapy Vitals Temp: 98.3 F (36.8 C) Temp src: Oral Pulse Rate: 79  Resp: 20  BP: 135/79 mmHg Patient Position, if appropriate: Lying Oxygen Therapy SpO2: 98 % O2 Device: None (Room air) Pain:  no c/o pain  See FIM for current functional status  Therapy/Group: Individual Therapy  Roney Mans [facility] 07/09/2011, 8:55 AM

## 2011-07-09 NOTE — Patient Care Conference (Signed)
Inpatient RehabilitationTeam Conference Note Date: 07/08/2011   Time: 1:45 PM    Patient Name: Chad [name]      Medical Record Number: [id number]  Date of Birth: [birth year] Sex: Male         Room/Bed: 4037/4037-01 Payor Info: Payor: BLUE CROSS BLUE SHIELD  Plan: BCBS PPO OUT OF STATE  Product Type: *No Product type*     Admitting Diagnosis: CRITICAL ILLNESS MYOPATHY TRACH DIALYSIS   Admit Date/Time:  06/27/2011  5:14 PM Admission Comments: No comment available   Primary Diagnosis:  Critical illness myopathy Principal Problem: Critical illness myopathy  Patient Active Problem List   Diagnosis Date Noted  . 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 Septic shock 06/10/2011  . Acute renal failure 06/10/2011  . Cardiomyopathy 06/10/2011  . Acute systolic heart failure 06/09/2011  . Renal transplant disorder 06/05/2011  . HCAP (healthcare-associated pneumonia) 06/04/2011  . Thrombocytopenia 06/04/2011  . Acute respiratory failure 06/04/2011  . ARDS (adult respiratory distress syndrome) 06/04/2011    Expected Discharge Date: Expected Discharge Date: 07/18/11  Team Members Present: Physician: Dr. Claudette Laws Case Manager Present: Melanee Spry, RN Social Worker Present: Amada Jupiter, LCSW Nurse Present: Carmie End, RN PT Present: Reggy Eye, PT;Becky Henrene Dodge, PT;Other (comment) Sherrine Maples) OT Present: Roney Mans, OT;Ardis Rowan, COTA SLP Present: Myra Rude, SLP Other (Discipline and Name): Tora Duck, PPS Coordinator     Current Status/Progress Goal Weekly Team Focus  Medical   deconditioning, critical illness myopathy, decannulated from trach, acute renal failure  monitor renal status with the help of nephrology  monitor kidney function tests   Bowel/Bladder   continent bowel and bladder lbm 6-9 -13   mod independence   monitor for bm every other day    Swallow/Nutrition/ Hydration             ADL's   min A overall and at times mod A for sit to  stand  mod I overall  activity tolerance, standing balance sit to stand   Mobility   min-mod A transfers, supervision gait  mod I  activity tolerance, strength   Communication             Safety/Cognition/ Behavioral Observations            Pain   n/a         Skin   blister to left posterior calf wound left posterior calf dressing santyl damp to dry dressing  old trach site dry dressing   min assist with dressing changes   educate on dressing changes and educate caregiver      *See Interdisciplinary Assessment and Plan and progress notes for long and short-term goals  Barriers to Discharge: poor endurance    Possible Resolutions to Barriers:  build up endurance gradually with physical and occupational therapy    Discharge Planning/Teaching Needs:  home with family and girlfriend to assist as needed - expected to reach mod i goals      Team Discussion: Progressing well toward goals.   Revisions to Treatment Plan: none    Continued Need for Acute Rehabilitation Level of Care: The patient requires daily medical management by a physician with specialized training in physical medicine and rehabilitation for the following conditions: Daily direction of a multidisciplinary physical rehabilitation program to ensure safe treatment while eliciting the highest outcome that is of practical value to the patient.: Yes Daily medical management of patient stability for increased activity during participation in an intensive rehabilitation regime.: Yes Daily analysis  of laboratory values and/or radiology reports with any subsequent need for medication adjustment of medical intervention for : Neurological problems;Other  [name] 07/09/2011, 1:54 PM

## 2011-07-09 NOTE — Care Management Note (Signed)
Patient ID: Chad [name], male   DOB: 07-23-1955, 56 y.o.   MRN: [id number] Pt participating & progressing well toward goals & d/c 07/18/11.  Pt updated about this after team conference yesterday.  Today faxed update sent to Leavy Cella at [facility].

## 2011-07-09 NOTE — Progress Notes (Signed)
Physical Therapy Note  Patient Details  Name: Chad [name] MRN: [id number] Date of Birth: [birth year] Today's Date: 07/09/2011  Time: 840-935 55 minutes  1:1 No c/o pain.  Gait with rollator > 300' with supervision in controlled environment, multiple reps with multiple standing rest breaks during gait.  Gait outdoors on uneven surfaces, incline/decline with rollator with supervision, more frequent standing rest breaks with gait outdoors.  Sit to stands for benches outside with mod A due to low surface.  Stairs x 5 with 1 handrail with close supervision, standing rest break after 3rd stair.  Pt with good safety awareness and mobility with rollator outdoors.  Nu step for strength and endurance level 4 x 8 minutes.  Time: 1300-1345 45 minutes  1:1  No c/o pain.  Standing therex for HEP for LE strengthening and endurance.  Pt performed with cues for technique.  Sit to stand training without UEs from elevated surface multiple reps for continued LE strengthening for sit to stands.  Pt is supervision/mod I with all mobility except sit to stand as he has continued LE weakness.    Aerie Donica 07/09/2011, 4:14 PM

## 2011-07-09 NOTE — Progress Notes (Signed)
Occupational Therapy Session Note  Patient Details  Name: Chad [name] MRN: [id number] Date of Birth: 07-08-1955  Today's Date: 07/09/2011 Time: 1130-1200 Time Calculation (min): 30 min  Short Term Goals: Week 2:  OT Short Term Goal 1 (Week 2): Pt will perform bed mobility mod I in prep for ADL OT Short Term Goal 2 (Week 2): Pt will perform toilet transfers with min A  OT Short Term Goal 3 (Week 2): Pt will dress LB sit to stand with close supervision  OT Short Term Goal 4 (Week 2): Pt will obtain clothing from drawers with RW in standing with supervision   Skilled Therapeutic Interventions/Progress Updates:    1:1 focus on functional ambulation with increased distance without rest break until arrived at gym. Focused on UB and core strengthening and endurance. Used 2lb and 3lb weights and orange theraband for bilateral UE (all large muscle groups- one time each for exercises with 15-20 reps) strengthening with short rest breaks, modified sit ups with functional reaching.  Therapy Documentation Precautions:  Precautions Precautions: Fall Restrictions Weight Bearing Restrictions: No Vital Signs: Therapy Vitals Temp: 98.4 F (36.9 C) Temp src: Oral Pulse Rate: 76  Resp: 18  BP: 145/94 mmHg Patient Position, if appropriate: Sitting Oxygen Therapy SpO2: 94 % O2 Device: None (Room air) Pain:  no c/o pain  See FIM for current functional status  Therapy/Group: Individual Therapy  Roney Mans [facility] 07/09/2011, 4:21 PM

## 2011-07-09 NOTE — Progress Notes (Signed)
Patient ID: ASSER LUCENA, male   DOB: [birth year], 56 y.o.   MRN: [id number] Subjective/Complaints: No complaints of SOB or CP. Motivated.  Stamina better. Continues to make urine.  Appreciate renal consultant Review of Systems  Respiratory: Negative for cough and shortness of breath.   Cardiovascular: Positive for leg swelling.       Chronic dorsum of L foot  Gastrointestinal: Negative.   Genitourinary:       Good output  Neurological: Positive for focal weakness and weakness.       Both legs weak  All other systems reviewed and are negative.    Objective: Vital Signs: Blood pressure 135/79, pulse 79, temperature 98.3 F (36.8 C), temperature source Oral, resp. rate 20, height 6' (1.829 m), weight 116.257 kg (256 lb 4.8 oz), SpO2 98.00%. No results found. Results for orders placed during the hospital encounter of 06/27/11 (from the past 72 hour(s))  BASIC METABOLIC PANEL     Status: Abnormal   Collection Time   07/07/11  7:13 AM      Component Value Range Comment   Sodium 134 (*) 135 - 145 mEq/L    Potassium 3.2 (*) 3.5 - 5.1 mEq/L    Chloride 98  96 - 112 mEq/L    CO2 21  19 - 32 mEq/L    Glucose, Bld 79  70 - 99 mg/dL    BUN 72 (*) 6 - 23 mg/dL    Creatinine, Ser 4.09 (*) 0.50 - 1.35 mg/dL    Calcium 8.4  8.4 - 81.1 mg/dL    GFR calc non Af Amer 12 (*) >90 mL/min    GFR calc Af Amer 14 (*) >90 mL/min   BASIC METABOLIC PANEL     Status: Abnormal   Collection Time   07/08/11  6:40 AM      Component Value Range Comment   Sodium 137  135 - 145 mEq/L    Potassium 3.7  3.5 - 5.1 mEq/L    Chloride 101  96 - 112 mEq/L    CO2 21  19 - 32 mEq/L    Glucose, Bld 87  70 - 99 mg/dL    BUN 72 (*) 6 - 23 mg/dL    Creatinine, Ser 9.14 (*) 0.50 - 1.35 mg/dL    Calcium 8.5  8.4 - 78.2 mg/dL    GFR calc non Af Amer 13 (*) >90 mL/min    GFR calc Af Amer 15 (*) >90 mL/min   BASIC METABOLIC PANEL     Status: Abnormal   Collection Time   07/09/11  6:40 AM      Component Value Range  Comment   Sodium 135  135 - 145 mEq/L    Potassium 4.1  3.5 - 5.1 mEq/L    Chloride 101  96 - 112 mEq/L    CO2 22  19 - 32 mEq/L    Glucose, Bld 96  70 - 99 mg/dL    BUN 74 (*) 6 - 23 mg/dL    Creatinine, Ser 9.56 (*) 0.50 - 1.35 mg/dL    Calcium 8.5  8.4 - 21.3 mg/dL    GFR calc non Af Amer 12 (*) >90 mL/min    GFR calc Af Amer 14 (*) >90 mL/min     HEENT: EOMI, PERRL Heart: RRR, No M,R,G Abdomen: bs+, non-tender. Edema 2+ on either limb. Wound on back of left calf with less fibronecrotic material. Fluctuant blister in skin folds on LLE. Trach stoma pink with granulation  and still open but closing.. No distress. Chest remains clear.  Good insight, awareness, memory/ Motor 4/5 in BUE 3-/5 in B HF,KE, Ankle DF/PF Assessment/Plan: 1. Functional deficits secondary to critical illness myopathy which require 3+ hours per day of interdisciplinary therapy in a comprehensive inpatient rehab setting. Physiatrist is providing close team supervision and 24 hour management of active medical problems listed below. Physiatrist and rehab team continue to assess barriers to discharge/monitor patient progress toward functional and medical goals. FIM: FIM - Bathing Bathing Steps Patient Completed: Chest;Right Arm;Left Arm;Abdomen;Front perineal area;Buttocks;Right upper leg;Left upper leg;Right lower leg (including foot);Left lower leg (including foot) Bathing: 4: Steadying assist  FIM - Upper Body Dressing/Undressing Upper body dressing/undressing steps patient completed: Thread/unthread right sleeve of pullover shirt/dresss;Put head through opening of pull over shirt/dress;Thread/unthread left sleeve of pullover shirt/dress;Pull shirt over trunk Upper body dressing/undressing: 6: More than reasonable amount of time FIM - Lower Body Dressing/Undressing Lower body dressing/undressing steps patient completed: Thread/unthread right underwear leg;Thread/unthread left underwear leg;Pull underwear  up/down;Thread/unthread right pants leg;Thread/unthread left pants leg;Don/Doff left sock;Don/Doff right sock;Pull pants up/down;Fasten/unfasten pants;Don/Doff right shoe;Don/Doff left shoe;Fasten/unfasten right shoe;Fasten/unfasten left shoe Lower body dressing/undressing: 4: Steadying Assist  FIM - Toileting Toileting steps completed by patient: Adjust clothing prior to toileting;Performs perineal hygiene;Adjust clothing after toileting Toileting Assistive Devices: Grab bar or rail for support Toileting: 4: Steadying assist  FIM - Diplomatic Services operational officer Devices: Elevated toilet seat;Walker Toilet Transfers: 4-To toilet/BSC: Min A (steadying Pt. > 75%);4-From toilet/BSC: Min A (steadying Pt. > 75%)  FIM - Bed/Chair Transfer Bed/Chair Transfer Assistive Devices: Bed rails Bed/Chair Transfer: 4: Bed > Chair or W/C: Min A (steadying Pt. > 75%);4: Chair or W/C > Bed: Min A (steadying Pt. > 75%)  FIM - Locomotion: Wheelchair Distance: 20' Locomotion: Wheelchair: 0: Activity did not occur FIM - Locomotion: Ambulation Locomotion: Ambulation Assistive Devices: Designer, industrial/product Ambulation/Gait Assistance: 5: Supervision Locomotion: Ambulation: 5: Travels 150 ft or more with supervision/safety issues  Comprehension Comprehension Mode: Auditory Comprehension: 6-Follows complex conversation/direction: With extra time/assistive device  Expression Expression Mode: Verbal Expression Assistive Devices: 6-Talk trach valve Expression: 6-Expresses complex ideas: With extra time/assistive device  Social Interaction Social Interaction: 6-Interacts appropriately with others with medication or extra time (anti-anxiety, antidepressant).  Problem Solving Problem Solving: 6-Solves complex problems: With extra time  Memory Memory: 6-More than reasonable amt of time  Medical Problem List and Plan:  1. DVT Prophylaxis/Anticoagulation: Mechanical: Sequential compression devices,  below knee Bilateral lower extremities-ambulating 2. Pain Management: Continue hydrocodone prn  3. Mood: Monitor for now. Seems very motivated.  4. Renal transplant with chronic allograft nephropathy: continue HD on prn basis per nephrology. . Strict I and O. Continue prednisone and sirolimus. Po lasix initiated. Making more urine. Cr's seem to be holding from 4.9 to 5.0 5. Left calf ulcer- continue collagegase dressing with packing 6. Afib: Continue amiodarone. ?coumadin per cardiology--discuss next week. 7. Anemia of chronic disease:  Continue aranesp weekly. 8. Pulmonary- trach out and no issues whatsoever. Continue occlusive dressing. 9. Hypokalemia:  To be supplemented.   LOS (Days) 12 A FACE TO FACE EVALUATION WAS PERFORMED  Akeel Reffner E 07/09/2011, 11:20 AM

## 2011-07-10 ENCOUNTER — Encounter (HOSPITAL_COMMUNITY): Payer: Self-pay | Admitting: *Deleted

## 2011-07-10 LAB — BASIC METABOLIC PANEL
BUN: 73 mg/dL — ABNORMAL HIGH (ref 6–23)
CO2: 20 mEq/L (ref 19–32)
Chloride: 100 mEq/L (ref 96–112)
Creatinine, Ser: 4.61 mg/dL — ABNORMAL HIGH (ref 0.50–1.35)
Glucose, Bld: 77 mg/dL (ref 70–99)
Potassium: 3.8 mEq/L (ref 3.5–5.1)

## 2011-07-10 LAB — CBC
HCT: 28.9 % — ABNORMAL LOW (ref 39.0–52.0)
Hemoglobin: 9 g/dL — ABNORMAL LOW (ref 13.0–17.0)
MCHC: 31.1 g/dL (ref 30.0–36.0)
MCV: 100.3 fL — ABNORMAL HIGH (ref 78.0–100.0)
RDW: 17.7 % — ABNORMAL HIGH (ref 11.5–15.5)

## 2011-07-10 LAB — IRON AND TIBC: Saturation Ratios: 35 % (ref 20–55)

## 2011-07-10 LAB — FERRITIN: Ferritin: 1284 ng/mL — ABNORMAL HIGH (ref 22–322)

## 2011-07-10 NOTE — Progress Notes (Signed)
Patient ID: Chad [name], male   DOB: 06/02/1955, 55 y.o.   MRN: [id number]   Chad [name] Progress Note    Subjective:   No emerging complaints/ events overnight   Objective:   BP 143/79  Pulse 84  Temp 98.6 F (37 C) (Oral)  Resp 19  Ht 6' (1.829 m)  Wt 117.3 kg (258 lb 9.6 oz)  BMI 35.07 kg/m2  SpO2 98%  Intake/Output Summary (Last 24 hours) at 07/10/11 1128 Last data filed at 07/10/11 0800  Gross per 24 hour  Intake    480 ml  Output    750 ml  Net   -270 ml   Weight change: -0.443 kg (-15.6 oz)  Physical Exam: WUJ:[id number] participating in rehab exercises AOZ:HYQMV RRR, normal s1 and S2  Resp:CTA bilaterally, NO rales/rhonchi HQI:ONGE, obese, NT, BS normal XBM:WUXLKGM pedal edema/lymphedema- LEs in wraps  Imaging: No results found.  Labs: BMET  Lab 07/10/11 0640 07/09/11 0640 07/08/11 0640 07/07/11 0713 07/06/11 0635 07/04/11 0620  NA 135 135 137 134* 135 133*  K 3.8 4.1 3.7 3.2* 3.2* 3.4*  CL 100 101 101 98 99 98  CO2 20 22 21 21 21 21   GLUCOSE 77 96 87 79 74 75  BUN 73* 74* 72* 72* 73* 69*  CREATININE 4.61* 4.83* 4.58* 4.83* 5.08* 4.99*  ALB -- -- -- -- -- --  CALCIUM 8.6 8.5 8.5 8.4 8.2* 8.3*  PHOS -- -- -- -- 5.5* 6.0*   CBC  Lab 07/10/11 0640  WBC 12.1*  NEUTROABS --  HGB 9.0*  HCT 28.9*  MCV 100.3*  PLT 151    Medications:      . amiodarone  200 mg Oral Daily  . antiseptic oral rinse  15 mL Mouth Rinse QID  . calcitRIOL  0.25 mcg Oral Daily  . chlorhexidine  15 mL Mouth Rinse BID  . collagenase   Topical Daily  . darbepoetin (ARANESP) injection - NON-DIALYSIS  100 mcg Subcutaneous Q Thu-1800  . pantoprazole  40 mg Oral BID AC  . potassium chloride  10 mEq Oral Daily  . predniSONE  10 mg Oral Q breakfast  . sirolimus  1.5 mg Oral Daily     Assessment/ Plan:   1. 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 Continues to show some fluctuations in Scr and fair UOP (not accurately charted)- no  acute HD needs noted today. Has tunneled HD cath R IJ in place for use as needed. Sirolimus level 4.1- (target level is 7-9), increased dose to 1.5 mg and levels to be checked friday  2. PNA, resolved clinically- now off ABX  3. Anemia on Aranesp- check CBC and iron panel tomorrow  4. Sec HPTH: on Calcitriol  5. Hypokalemia: corrected by PO supplements   Zetta Bills, MD 07/10/2011, 11:28 AM

## 2011-07-10 NOTE — Progress Notes (Signed)
Occupational Therapy Session Note  Patient Details  Name: [name] MRN: [id number] Date of Birth: 03/13/1955  Today's Date: 07/10/2011 Time: 1330-1400 Time Calculation (min): 30 min  Short Term Goals: Week 2:  OT Short Term Goal 1 (Week 2): Pt will perform bed mobility mod I in prep for ADL OT Short Term Goal 2 (Week 2): Pt will perform toilet transfers with min A  OT Short Term Goal 3 (Week 2): Pt will dress LB sit to stand with close supervision  OT Short Term Goal 4 (Week 2): Pt will obtain clothing from drawers with RW in standing with supervision   Skilled Therapeutic Interventions/Progress Updates:    1:1 therapeutic activity to work on activity tolerance and overall strengthening. Focus on sit to stands from different height surfaces- attempted to try sit to stands with out UE just forward weight shift- unable to complete this- still significant use of hands needed. Standing with dynamic movement with orange theraband tied around thighs to perform side stepping both ways, forward and backwards wide ambulation to increased endurance and LE strength for increased ADL performance.  Therapy Documentation Precautions:  Precautions Precautions: Fall Restrictions Weight Bearing Restrictions: No Pain:  no c/o pain  See FIM for current functional status  Therapy/Group: Individual Therapy  Roney Mans [facility] 07/10/2011, 2:39 PM

## 2011-07-10 NOTE — Progress Notes (Signed)
Recreational Therapy Session Note  Patient Details  Name: BLAZE NYLUND MRN: [id number] Date of Birth: May 28, 1955 Today's Date: 07/10/2011 Time:  1430-1500 Pain: no c/o Skilled Therapeutic Interventions/Progress Updates: stood without UE support to alternately toss/catch or kick a ball working on activity tolerance, standing balance with supervision-min assist for LOB x3.  Pt activity 10 min x1, 8 min x2.  Discussed community reintegration/outing for next week and is agreeable.  Therapy/Group: Individual Therapy  Makaiya Geerdes 07/10/2011, 4:14 PM

## 2011-07-10 NOTE — Progress Notes (Signed)
Physical Therapy Note  Patient Details  Name: Chad [name] MRN: [id number] Date of Birth: [birth year] Today's Date: 07/10/2011  Time 1: 1100-1155 55 minutes  1:1 No c/o pain.  Gait on unit with mod I with rollator > 150' x 2 with standing rest breaks.  Stair training 10 stairs with 1 standing rest break x 3 minutes with supervision with 2 handrails.  Standing balance activity for bending/squating and reaching for LE strength, endurance and functional reaching task.  Pt required frequent rest due to fatigue, no LOB static bend and reach with no UE support.  Sit to stand training with no UE support for LE strength and endurance.  Pt required elevated surface and frequent, more prolonged rest breaks with fatigue.   Time 2: 1430-1515 45 minutes  1:1 No c/o pain.  Gait mod I with rollator walker on unit.  Treatment focus on standing balance with ball toss/kick without UE support.  Pt required min A to correct several LOB with single leg stance.  Able to stand and participate in ball toss/kick 10 mins, 2 x 8 mins before fatigued.  Improving activity tolerance.   [name] 07/10/2011, 12:24 PM

## 2011-07-10 NOTE — Progress Notes (Signed)
Occupational Therapy Session Note  Patient Details  Name: Chad [name] MRN: [id number] Date of Birth: 28-Dec-1955  Today's Date: 07/10/2011 Time: 1000-1100 Time Calculation (min): 60 min   Skilled Therapeutic Interventions/Progress Updates:    Pt amb in room without Rollator to gather clothing prior to complete bathing and dressing tasks while standing at sink.  Pt completed all tasks while standing except bathing feet.  Pt utilized sink to support self when standing on one leg to doff/don pants.  Pt completed all bathing and dressing tasks without rest break.  Pt completed grooming tasks seated in w/c and rested before ambulating to gym with Rollator to engage in endurance/cardio activity on arm bike.  Pt completed arm bike exercises for 2 min X 2 on level 2.  Focus on activity tolerance and standing balance to increase independence with BADLs.  Therapy Documentation Precautions:  Precautions Precautions: Fall Restrictions Weight Bearing Restrictions: No General:   Vital Signs:   Pain: Pain Assessment Pain Assessment: No/denies pain  See FIM for current functional status  Therapy/Group: Individual Therapy  [name] 07/10/2011, 11:55 AM

## 2011-07-10 NOTE — Progress Notes (Signed)
Social Work Patient ID: Chad [name], male   DOB: 02/15/1955, 56 y.o.   MRN: [id number]   Met with patient this afternoon to answer his questions and concerns about possibly applying for SSD.  He admits he is very emotionally distraught over thought of not returning to "... A job I really enjoy", however, it is a physically demanding job and with long hours and he is not sure if he will ever be able to return to it's demands.  I reviewed the process of applying for SSD and left him with a booklet on the process. He plans to think about it through the evening and I will follow up with him in the morning.  Morayma [place]

## 2011-07-10 NOTE — Progress Notes (Signed)
Occupational Therapy Weekly Progress Note  Patient Details  Name: Chad [name] MRN: [id number] Date of Birth: 03-Jan-1956  Today's Date: 07/10/2011  Patient has met 4 of 4 short term goals.  Pt continues to make excellent progress in OT. Pt is overall min A with functional mobility (requiring min A at times) using a Rollator walker and min A for ADL tasks. Pt has been bathing at sink due to still having a HD access/port (even though not being used). Pt continues to make progress in strength and endurance towards his LTG of Mod I for basic ADL tasks. Pt still on schedule for d/c next week.  Patient continues to demonstrate the following deficits:muscle weakness, decreased cardiorespiratoy endurance and decreased postural control and decreased balance strategies and therefore will continue to benefit from skilled OT intervention to enhance overall performance with BADL.  Patient progressing toward long term goals..  Continue plan of care.  OT Short Term Goals Week 2:  OT Short Term Goal 1 (Week 2): Pt will perform bed mobility mod I in prep for ADL OT Short Term Goal 1 - Progress (Week 2): Met OT Short Term Goal 2 (Week 2): Pt will perform toilet transfers with min A  OT Short Term Goal 2 - Progress (Week 2): Met OT Short Term Goal 3 (Week 2): Pt will dress LB sit to stand with close supervision  OT Short Term Goal 3 - Progress (Week 2): Met OT Short Term Goal 4 (Week 2): Pt will obtain clothing from drawers with RW in standing with supervision  OT Short Term Goal 4 - Progress (Week 2): Met Week 3:  OT Short Term Goal 1 (Week 3): STG= LTG OT Short Term Goal 2 (Week 3): Pt will participate in community outing  Skilled Therapeutic Interventions/Progress Updates:    same POC  Therapy Documentation Precautions:  Precautions Precautions: Fall Restrictions Weight Bearing Restrictions: No ADL: See FIM    See FIM for current functional status  Therapy/Group: Individual  Therapy  [name] 07/10/2011, 2:44 PM

## 2011-07-10 NOTE — Progress Notes (Signed)
Patient ID: Chad [name], male   DOB: [birth year], 56 y.o.   MRN: [id number] Patient ID: Chad [name], male   DOB: May 15, 1955, 56 y.o.   MRN: [id number] Subjective/Complaints: No complaints of SOB or CP. Motivated.  Stamina better. Walking with a rolator and hand held assist.  Review of Systems  Respiratory: Negative for cough and shortness of breath.   Cardiovascular: Positive for leg swelling.       Chronic dorsum of L foot  Gastrointestinal: Negative.   Genitourinary:       Good output  Neurological: Positive for focal weakness and weakness.       Both legs weak  All other systems reviewed and are negative.    Objective: Vital Signs: Blood pressure 143/79, pulse 84, temperature 98.6 F (37 C), temperature source Oral, resp. rate 19, height 6' (1.829 m), weight 117.3 kg (258 lb 9.6 oz), SpO2 98.00%. No results found. Results for orders placed during the hospital encounter of 06/27/11 (from the past 72 hour(s))  BASIC METABOLIC PANEL     Status: Abnormal   Collection Time   07/08/11  6:40 AM      Component Value Range Comment   Sodium 137  135 - 145 mEq/L    Potassium 3.7  3.5 - 5.1 mEq/L    Chloride 101  96 - 112 mEq/L    CO2 21  19 - 32 mEq/L    Glucose, Bld 87  70 - 99 mg/dL    BUN 72 (*) 6 - 23 mg/dL    Creatinine, Ser 7.82 (*) 0.50 - 1.35 mg/dL    Calcium 8.5  8.4 - 95.6 mg/dL    GFR calc non Af Amer 13 (*) >90 mL/min    GFR calc Af Amer 15 (*) >90 mL/min   BASIC METABOLIC PANEL     Status: Abnormal   Collection Time   07/09/11  6:40 AM      Component Value Range Comment   Sodium 135  135 - 145 mEq/L    Potassium 4.1  3.5 - 5.1 mEq/L    Chloride 101  96 - 112 mEq/L    CO2 22  19 - 32 mEq/L    Glucose, Bld 96  70 - 99 mg/dL    BUN 74 (*) 6 - 23 mg/dL    Creatinine, Ser 2.13 (*) 0.50 - 1.35 mg/dL    Calcium 8.5  8.4 - 08.6 mg/dL    GFR calc non Af Amer 12 (*) >90 mL/min    GFR calc Af Amer 14 (*) >90 mL/min   BASIC METABOLIC PANEL     Status: Abnormal   Collection Time   07/10/11  6:40 AM      Component Value Range Comment   Sodium 135  135 - 145 mEq/L    Potassium 3.8  3.5 - 5.1 mEq/L    Chloride 100  96 - 112 mEq/L    CO2 20  19 - 32 mEq/L    Glucose, Bld 77  70 - 99 mg/dL    BUN 73 (*) 6 - 23 mg/dL    Creatinine, Ser 5.78 (*) 0.50 - 1.35 mg/dL    Calcium 8.6  8.4 - 46.9 mg/dL    GFR calc non Af Amer 13 (*) >90 mL/min    GFR calc Af Amer 15 (*) >90 mL/min   CBC     Status: Abnormal   Collection Time   07/10/11  6:40 AM      Component  Value Range Comment   WBC 12.1 (*) 4.0 - 10.5 K/uL    RBC 2.88 (*) 4.22 - 5.81 MIL/uL    Hemoglobin 9.0 (*) 13.0 - 17.0 g/dL    HCT 01.0 (*) 27.2 - 52.0 %    MCV 100.3 (*) 78.0 - 100.0 fL    MCH 31.3  26.0 - 34.0 pg    MCHC 31.1  30.0 - 36.0 g/dL    RDW 53.6 (*) 64.4 - 15.5 %    Platelets 151  150 - 400 K/uL     HEENT: EOMI, PERRL Heart: RRR, No M,R,G Abdomen: bs+, non-tender. Edema 2+ on either limb. Wound on back of left calf with less fibronecrotic material. Fluctuant blister in skin folds on LLE. Trach stoma pink with granulation and still open but closing.. No distress. Chest remains clear.  Good insight, awareness, memory/ Motor 4/5 in BUE 3 to 3+/5 in B HF,KE, Ankle DF/PF  Assessment/Plan: 1. Functional deficits secondary to critical illness myopathy which require 3+ hours per day of interdisciplinary therapy in a comprehensive inpatient rehab setting. Physiatrist is providing close team supervision and 24 hour management of active medical problems listed below. Physiatrist and rehab team continue to assess barriers to discharge/monitor patient progress toward functional and medical goals. FIM: FIM - Bathing Bathing Steps Patient Completed: Chest;Right Arm;Left Arm;Abdomen;Front perineal area;Buttocks;Right upper leg;Left upper leg;Right lower leg (including foot);Left lower leg (including foot) Bathing: 4: Steadying assist  FIM - Upper Body Dressing/Undressing Upper body  dressing/undressing steps patient completed: Thread/unthread right sleeve of pullover shirt/dresss;Put head through opening of pull over shirt/dress;Thread/unthread left sleeve of pullover shirt/dress;Pull shirt over trunk Upper body dressing/undressing: 6: More than reasonable amount of time FIM - Lower Body Dressing/Undressing Lower body dressing/undressing steps patient completed: Thread/unthread right underwear leg;Thread/unthread left underwear leg;Pull underwear up/down;Thread/unthread right pants leg;Thread/unthread left pants leg;Don/Doff left sock;Don/Doff right sock;Pull pants up/down;Fasten/unfasten pants;Don/Doff right shoe;Don/Doff left shoe;Fasten/unfasten right shoe;Fasten/unfasten left shoe Lower body dressing/undressing: 4: Steadying Assist  FIM - Toileting Toileting steps completed by patient: Adjust clothing prior to toileting;Performs perineal hygiene;Adjust clothing after toileting Toileting Assistive Devices: Grab bar or rail for support Toileting: 4: Steadying assist  FIM - Diplomatic Services operational officer Devices: Elevated toilet seat;Walker Toilet Transfers: 4-To toilet/BSC: Min A (steadying Pt. > 75%);4-From toilet/BSC: Min A (steadying Pt. > 75%)  FIM - Bed/Chair Transfer Bed/Chair Transfer Assistive Devices: Bed rails Bed/Chair Transfer: 4: Bed > Chair or W/C: Min A (steadying Pt. > 75%);4: Chair or W/C > Bed: Min A (steadying Pt. > 75%)  FIM - Locomotion: Wheelchair Distance: 20' Locomotion: Wheelchair: 0: Activity did not occur FIM - Locomotion: Ambulation Locomotion: Ambulation Assistive Devices: Designer, industrial/product Ambulation/Gait Assistance: 5: Supervision Locomotion: Ambulation: 5: Travels 150 ft or more with supervision/safety issues  Comprehension Comprehension Mode: Auditory Comprehension: 7-Follows complex conversation/direction: With no assist  Expression Expression Mode: Verbal Expression Assistive Devices: 6-Talk trach  valve Expression: 6-Expresses complex ideas: With extra time/assistive device  Social Interaction Social Interaction: 7-Interacts appropriately with others - No medications needed.  Problem Solving Problem Solving: 6-Solves complex problems: With extra time  Memory Memory: 6-More than reasonable amt of time  Medical Problem List and Plan:  1. DVT Prophylaxis/Anticoagulation: Mechanical: Sequential compression devices, below knee Bilateral lower extremities-ambulating 2. Pain Management: Continue hydrocodone prn  3. Mood: Monitor for now. Seems very motivated.  4. Renal transplant with chronic allograft nephropathy: continue HD on prn basis per nephrology. . Strict I and O. Continue prednisone and sirolimus. Po lasix  initiated. Making more urine.  5. Left calf ulcer- continue collagegase dressing with packing 6. Afib: Continue amiodarone. ?coumadin per cardiology-determine plan before dc 7. Anemia of chronic disease:  Continue aranesp weekly. 8. Pulmonary- trach out and no issues whatsoever. Continue occlusive dressing as there is still drainage from site. 9. Hypokalemia:  To be supplemented.   LOS (Days) 13 A FACE TO FACE EVALUATION WAS PERFORMED  Jonte Shiller T 07/10/2011, 9:07 AM

## 2011-07-11 DIAGNOSIS — G7281 Critical illness myopathy: Secondary | ICD-10-CM

## 2011-07-11 DIAGNOSIS — Z5189 Encounter for other specified aftercare: Secondary | ICD-10-CM

## 2011-07-11 LAB — BASIC METABOLIC PANEL WITH GFR
BUN: 74 mg/dL — ABNORMAL HIGH (ref 6–23)
CO2: 20 meq/L (ref 19–32)
Calcium: 8.6 mg/dL (ref 8.4–10.5)
Chloride: 102 meq/L (ref 96–112)
Creatinine, Ser: 4.72 mg/dL — ABNORMAL HIGH (ref 0.50–1.35)
GFR calc Af Amer: 15 mL/min — ABNORMAL LOW
GFR calc non Af Amer: 13 mL/min — ABNORMAL LOW
Glucose, Bld: 89 mg/dL (ref 70–99)
Potassium: 3.8 meq/L (ref 3.5–5.1)
Sodium: 136 meq/L (ref 135–145)

## 2011-07-11 LAB — MISCELLANEOUS TEST

## 2011-07-11 MED ORDER — COLLAGENASE 250 UNIT/GM EX OINT
TOPICAL_OINTMENT | Freq: Every day | CUTANEOUS | Status: AC
  Administered 2011-07-11 – 2011-07-13 (×3): via TOPICAL

## 2011-07-11 NOTE — Progress Notes (Signed)
Occupational Therapy Session Note  Patient Details  Name: ZYMIERE TROSTLE MRN: [id number] Date of Birth: 1956/01/04  Today's Date: 07/11/2011 Time: 5784-6962 Time Calculation (min): 59 min  Short Term Goals: Week 3:  OT Short Term Goal 1 (Week 3): STG= LTG OT Short Term Goal 2 (Week 3): Pt will participate in community outing  Skilled Therapeutic Interventions: ADL-retraining at walk-in shower for lower body, sink side for upper body and grooming.  Patient able to complete lower body bathing standing at walk-in shower with good endurance and thoroughness, even reaching to wash his feet while maintaining balance.  Patient rates exertion at 14 on Borg scale.  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     Therapy Documentation Precautions:  Precautions Precautions: Fall Restrictions Weight Bearing Restrictions: No  Pain: Pain Assessment Pain Assessment: No/denies pain  See FIM for current functional status  Therapy/Group: Individual Therapy  Georgeanne Nim 07/11/2011, 12:51 PM

## 2011-07-11 NOTE — Progress Notes (Signed)
Physical Therapy Weekly Progress Note  Patient Details  Name: Chad [name] MRN: [id number] Date of Birth: 14-Aug-1955  Today's Date: 07/11/2011  Patient has met 3 of 3 short term goals.  He is currently mod I with gait and mobility, requires occasional min A with sit to stand from low surfaces or with increased fatigue. Currently supervision with stairs and car transfers.  Patient continues to demonstrate the following deficits: decreased activity tolerance, decreased strength and therefore will continue to benefit from skilled PT intervention to enhance overall performance with activity tolerance and balance.  Patient progressing toward long term goals..  Continue plan of care.  PT Short Term Goals Week 2:  PT Short Term Goal 1 (Week 2): Pt will consistantly perform functional transfers with min A PT Short Term Goal 1 - Progress (Week 2): Met PT Short Term Goal 2 (Week 2): Pt will gait with close supervision 150' in controlled environment with no standing rest break PT Short Term Goal 2 - Progress (Week 2): Met PT Short Term Goal 3 (Week 2): Pt will demo dynamic standing balance for functional task x 4 minutes with supervision PT Short Term Goal 3 - Progress (Week 2): Met  Skilled Therapeutic Interventions/Progress Updates:  Ambulation/gait training;Balance/vestibular training;Discharge planning;Community reintegration;Functional mobility training;Therapeutic Activities;UE/LE Coordination activities;UE/LE Strength taining/ROM;Pain management;DME/adaptive equipment instruction;Therapeutic Exercise;Wheelchair propulsion/positioning;Stair training;Patient/family education;Neuromuscular re-education    See FIM for current functional status   Marcquis Ridlon 07/11/2011, 11:44 AM

## 2011-07-11 NOTE — Progress Notes (Signed)
Physical Therapy Note  Patient Details  Name: NATANIEL GASPER MRN: [id number] Date of Birth: August 13, 1955 Today's Date: 07/11/2011  Time 1: 1045-1140 55 minutes  1:1 No c/o pain.  Gait with mod I rollator in controlled and household environments with increased cadence and no standing rest breaks for > 150'.  Treatment focus on household gait without AD.  Pt requires close supervision for obstacle negotiation without AD, supervision for controlled environment up to 100'.  Pt educated on energy conservation and how rollator will increase conservation of energy.  Gait without AD while carrying ball with 2 hands.  Pt required steadying assist.  Increased fatigue noted.  Discussed with pt use of seat on rollator to carry objects around home to conserve energy and increase safety.  Time 2: 1400-1440 40 minutes  1:1 No c/o pain.  Treatment session focused on therex for LE strength and endurance.  Pt given handout of HEP for B LE standing exercises.  Performed 2 x 10 bilaterally.  Step ups to 6'' step forward and lateral 2 x 5 for LE strength and endurance.  Pt required frequent rests during step up exercises due to decreased muscular endurance.   Kieran Arreguin 07/11/2011, 11:42 AM

## 2011-07-11 NOTE — Progress Notes (Signed)
Occupational Therapy Session Note  Patient Details  Name: SABRE LEONETTI MRN: [id number] Date of Birth: 11-Aug-1955  Today's Date: 07/11/2011 Time: 1300-1330 Time Calculation (min): 30 min  Short Term Goals: Week 3:  OT Short Term Goal 1 (Week 3): STG= LTG OT Short Term Goal 2 (Week 3): Pt will participate in community outing  Skilled Therapeutic Interventions: Pt seen for therapeutic activities with emphasis on endurance, functional mobility, self-monitoring, LE strengthening.  Patient completed 15 min on NuStep, level 4, exertion was rated at 14 per Borg scale.  Patient educated on benefits of chair squats to improve LE strength.   Patient educated and advised on use of AE when at work, if feasible.   Patient tolerated treatment well and affirms need for LE strengthening and improved endurance prior to returning to work.     Therapy Documentation Precautions:  Precautions Precautions: Fall Restrictions Weight Bearing Restrictions: No  Pain: No pain  See FIM for current functional status  Therapy/Group: Individual Therapy  Georgeanne Nim 07/11/2011, 3:03 PM

## 2011-07-11 NOTE — Progress Notes (Signed)
Subjective/Complaints: No complaints of SOB or CP. Motivated.  Stamina better. Walking with a rolator and hand held assist.  Review of Systems  Respiratory: Negative for cough and shortness of breath.   Cardiovascular: Positive for leg swelling.       Chronic dorsum of L foot  Gastrointestinal: Negative.   Genitourinary:       Good output  Neurological: Positive for focal weakness and weakness.       Both legs weak  All other systems reviewed and are negative.    Objective: Vital Signs: Blood pressure 149/85, pulse 81, temperature 98.3 F (36.8 C), temperature source Oral, resp. rate 16, height 6' (1.829 m), weight 114.6 kg (252 lb 10.4 oz), SpO2 98.00%. No results found. Results for orders placed during the hospital encounter of 06/27/11 (from the past 72 hour(s))  BASIC METABOLIC PANEL     Status: Abnormal   Collection Time   07/09/11  6:40 AM      Component Value Range Comment   Sodium 135  135 - 145 mEq/L    Potassium 4.1  3.5 - 5.1 mEq/L    Chloride 101  96 - 112 mEq/L    CO2 22  19 - 32 mEq/L    Glucose, Bld 96  70 - 99 mg/dL    BUN 74 (*) 6 - 23 mg/dL    Creatinine, Ser 4.13 (*) 0.50 - 1.35 mg/dL    Calcium 8.5  8.4 - 24.4 mg/dL    GFR calc non Af Amer 12 (*) >90 mL/min    GFR calc Af Amer 14 (*) >90 mL/min   BASIC METABOLIC PANEL     Status: Abnormal   Collection Time   07/10/11  6:40 AM      Component Value Range Comment   Sodium 135  135 - 145 mEq/L    Potassium 3.8  3.5 - 5.1 mEq/L    Chloride 100  96 - 112 mEq/L    CO2 20  19 - 32 mEq/L    Glucose, Bld 77  70 - 99 mg/dL    BUN 73 (*) 6 - 23 mg/dL    Creatinine, Ser 0.10 (*) 0.50 - 1.35 mg/dL    Calcium 8.6  8.4 - 27.2 mg/dL    GFR calc non Af Amer 13 (*) >90 mL/min    GFR calc Af Amer 15 (*) >90 mL/min   CBC     Status: Abnormal   Collection Time   07/10/11  6:40 AM      Component Value Range Comment   WBC 12.1 (*) 4.0 - 10.5 K/uL    RBC 2.88 (*) 4.22 - 5.81 MIL/uL    Hemoglobin 9.0 (*) 13.0 - 17.0  g/dL    HCT 53.6 (*) 64.4 - 52.0 %    MCV 100.3 (*) 78.0 - 100.0 fL    MCH 31.3  26.0 - 34.0 pg    MCHC 31.1  30.0 - 36.0 g/dL    RDW 03.4 (*) 74.2 - 15.5 %    Platelets 151  150 - 400 K/uL   IRON AND TIBC     Status: Abnormal   Collection Time   07/10/11  6:40 AM      Component Value Range Comment   Iron 61  42 - 135 ug/dL    TIBC 595 (*) 638 - 756 ug/dL    Saturation Ratios 35  20 - 55 %    UIBC 111 (*) 125 - 400 ug/dL   FERRITIN  Status: Abnormal   Collection Time   07/10/11  6:40 AM      Component Value Range Comment   Ferritin 1284 (*) 22 - 322 ng/mL   BASIC METABOLIC PANEL     Status: Abnormal   Collection Time   07/11/11  6:55 AM      Component Value Range Comment   Sodium 136  135 - 145 mEq/L    Potassium 3.8  3.5 - 5.1 mEq/L    Chloride 102  96 - 112 mEq/L    CO2 20  19 - 32 mEq/L    Glucose, Bld 89  70 - 99 mg/dL    BUN 74 (*) 6 - 23 mg/dL    Creatinine, Ser 0.86 (*) 0.50 - 1.35 mg/dL    Calcium 8.6  8.4 - 57.8 mg/dL    GFR calc non Af Amer 13 (*) >90 mL/min    GFR calc Af Amer 15 (*) >90 mL/min     HEENT: EOMI, PERRL Heart: RRR, No M,R,G Abdomen: bs+, non-tender. Edema 2+ on either limb. Wound on back of left calf with less fibronecrotic material. Fluctuant blister in skin folds on LLE. Trach stoma pink with granulation with some fibronecrotic tissue.. No distress. Chest remains clear.  Good insight, awareness, memory/ Motor 4/5 in BUE 3 to 3+/5 in B HF,KE, Ankle DF/PF  Assessment/Plan: 1. Functional deficits secondary to critical illness myopathy which require 3+ hours per day of interdisciplinary therapy in a comprehensive inpatient rehab setting. Physiatrist is providing close team supervision and 24 hour management of active medical problems listed below. Physiatrist and rehab team continue to assess barriers to discharge/monitor patient progress toward functional and medical goals. FIM: FIM - Bathing Bathing Steps Patient Completed: Chest;Right  Arm;Left Arm;Abdomen;Front perineal area;Buttocks;Right upper leg;Left upper leg;Right lower leg (including foot);Left lower leg (including foot) Bathing: 5: Supervision: Safety issues/verbal cues  FIM - Upper Body Dressing/Undressing Upper body dressing/undressing steps patient completed: Thread/unthread right sleeve of pullover shirt/dresss;Thread/unthread left sleeve of pullover shirt/dress;Put head through opening of pull over shirt/dress;Pull shirt over trunk Upper body dressing/undressing: 6: More than reasonable amount of time FIM - Lower Body Dressing/Undressing Lower body dressing/undressing steps patient completed: Thread/unthread right pants leg;Thread/unthread left pants leg;Pull pants up/down;Fasten/unfasten pants;Don/Doff right sock;Don/Doff left sock;Don/Doff right shoe;Don/Doff left shoe;Fasten/unfasten left shoe;Fasten/unfasten right shoe Lower body dressing/undressing: 4: Steadying Assist  FIM - Toileting Toileting steps completed by patient: Adjust clothing prior to toileting;Performs perineal hygiene;Adjust clothing after toileting Toileting Assistive Devices: Grab bar or rail for support Toileting: 4: Steadying assist  FIM - Diplomatic Services operational officer Devices: Elevated toilet seat;Walker Toilet Transfers: 4-To toilet/BSC: Min A (steadying Pt. > 75%);4-From toilet/BSC: Min A (steadying Pt. > 75%)  FIM - Bed/Chair Transfer Bed/Chair Transfer Assistive Devices: Bed rails Bed/Chair Transfer: 4: Bed > Chair or W/C: Min A (steadying Pt. > 75%);4: Chair or W/C > Bed: Min A (steadying Pt. > 75%)  FIM - Locomotion: Wheelchair Distance: 20' Locomotion: Wheelchair: 0: Activity did not occur FIM - Locomotion: Ambulation Locomotion: Ambulation Assistive Devices: Designer, industrial/product Ambulation/Gait Assistance: 5: Supervision Locomotion: Ambulation: 6: Travels 150 ft or more independently/takes more than reasonable amount of time  Comprehension Comprehension Mode:  Auditory Comprehension: 7-Follows complex conversation/direction: With no assist  Expression Expression Mode: Verbal Expression Assistive Devices: 6-Talk trach valve Expression: 7-Expresses complex ideas: With no assist  Social Interaction Social Interaction: 7-Interacts appropriately with others - No medications needed.  Problem Solving Problem Solving: 7-Solves complex problems: Recognizes & self-corrects  Memory Memory: 6-More than reasonable amt of time  Medical Problem List and Plan:  1. DVT Prophylaxis/Anticoagulation: Mechanical: Sequential compression devices, below knee Bilateral lower extremities-ambulating 2. Pain Management: Continue hydrocodone prn  3. Mood: Monitor for now. Seems very motivated.  4. Renal transplant with chronic allograft nephropathy: continue HD on prn basis per nephrology. . Strict I and O. Continue prednisone and sirolimus. Po lasix initiated. Making more urine.  5. Left calf ulcer- continue collagegase dressing with packing 6. Afib: Continue amiodarone. ?coumadin per cardiology-determine plan before dc 7. Anemia of chronic disease:  Continue aranesp weekly. 8. Pulmonary- trach out and no issues whatsoever. Continue dressing but add santyl.  9. Hypokalemia:  To be supplemented.   LOS (Days) 14 A FACE TO FACE EVALUATION WAS PERFORMED  Lindsee Labarre T 07/11/2011, 8:36 AM

## 2011-07-12 LAB — BASIC METABOLIC PANEL
CO2: 18 mEq/L — ABNORMAL LOW (ref 19–32)
Calcium: 8.9 mg/dL (ref 8.4–10.5)
Creatinine, Ser: 4.54 mg/dL — ABNORMAL HIGH (ref 0.50–1.35)
GFR calc Af Amer: 16 mL/min — ABNORMAL LOW (ref >90)
Sodium: 134 mEq/L — ABNORMAL LOW (ref 135–145)

## 2011-07-12 MED ORDER — CARVEDILOL 3.125 MG PO TABS
3.1250 mg | ORAL_TABLET | Freq: Two times a day (BID) | ORAL | Status: DC
  Administered 2011-07-12 – 2011-07-16 (×9): 3.125 mg via ORAL
  Filled 2011-07-12 (×11): qty 1

## 2011-07-12 NOTE — Progress Notes (Signed)
Patient ID: Chad [name], male   DOB: [birth year], 56 y.o.   MRN: [id number]    [facility] Progress Note    Subjective:   No acute complaints-   Objective:   BP 137/78  Pulse 78  Temp 98.1 F (36.7 C) (Oral)  Resp 18  Ht 6' (1.829 m)  Wt 115.1 kg (253 lb 12 oz)  BMI 34.41 kg/m2  SpO2 98%  Intake/Output Summary (Last 24 hours) at 07/12/11 0854 Last data filed at 07/12/11 0600  Gross per 24 hour  Intake    720 ml  Output    700 ml  Net     20 ml   Weight change: 0.5 kg (1 lb 1.6 oz)  Physical Exam: WUJ:[id number] up in wheelchair- at sink washing face AOZ:HYQMV RRR, normal S1 and S2  Resp:CTA bilaterally, no rales HQI:ONGE, obese, NT, BS normal XBM:WUXLKGM 2+ LE edema  Imaging: No results found.  Labs: BMET  Lab 07/12/11 0102 07/11/11 0655 07/10/11 0640 07/09/11 0640 07/08/11 0640 07/07/11 0713 07/06/11 0635  NA 134* 136 135 135 137 134* 135  K 4.2 3.8 3.8 4.1 3.7 3.2* 3.2*  CL 101 102 100 101 101 98 99  CO2 18* 20 20 22 21 21 21   GLUCOSE 80 89 77 96 87 79 74  BUN 73* 74* 73* 74* 72* 72* 73*  CREATININE 4.54* 4.72* 4.61* 4.83* 4.58* 4.83* 5.08*  ALB -- -- -- -- -- -- --  CALCIUM 8.9 8.6 8.6 8.5 8.5 8.4 8.2*  PHOS -- -- -- -- -- -- 5.5*   CBC  Lab 07/10/11 0640  WBC 12.1*  NEUTROABS --  HGB 9.0*  HCT 28.9*  MCV 100.3*  PLT 151    Medications:      . amiodarone  200 mg Oral Daily  . antiseptic oral rinse  15 mL Mouth Rinse QID  . calcitRIOL  0.25 mcg Oral Daily  . chlorhexidine  15 mL Mouth Rinse BID  . collagenase   Topical Daily  . collagenase   Topical Daily  . darbepoetin (ARANESP) injection - NON-DIALYSIS  100 mcg Subcutaneous Q Thu-1800  . pantoprazole  40 mg Oral BID AC  . potassium chloride  10 mEq Oral Daily  . predniSONE  10 mg Oral Q breakfast  . sirolimus  1.5 mg Oral Daily     Assessment/ Plan:   1. 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 Continues to show some fluctuations in Scr and  fair UOP (not accurately charted)- no acute HD needs noted today. Has tunneled HD cath R IJ in place for use as needed. Sirolimus level 4.1- (target level is 5-8), increased dose to 1.5 mg and levels pending. Plans noted by [facility] to f/u soon after discharge for allograft biopsy and further management. 2. 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 Anemia on Aranesp- Iron stores replete- hgb 9.0 4. Sec HPTH: on Calcitriol  5. HTN: start low dose carvedilol 3.125mg  PO BID today  Zetta Bills, MD 07/12/2011, 8:54 AM

## 2011-07-12 NOTE — Progress Notes (Signed)
Occupational Therapy Note  Patient Details  Name: Chad [name] MRN: [id number] Date of Birth: 08-21-1955 Today's Date: 07/12/2011 Time:   1330-1415  (45 min) Individual Therapy Pain:   None  Engaged in functional mobility using RW in day room.  Pt able to move around for 30 minutes with 3 rest breaks in standing position.  Pt. Tolerated session well.   Humberto Seals 07/12/2011, 5:53 PM

## 2011-07-12 NOTE — Progress Notes (Signed)
Patient ID: Chad [name], male   DOB: 11/21/1955, 56 y.o.   MRN: [id number] Subjective/Complaints: No complaints of SOB or CP. Excited about going home sooner Motivated.  Stamina better. Walking with a rolator and hand held assist.  Review of Systems  Respiratory: Negative for cough and shortness of breath.   Cardiovascular: Positive for leg swelling.       Chronic dorsum of L foot  Gastrointestinal: Negative.   Genitourinary:       Good output  Neurological: Positive for focal weakness and weakness.       Both legs weak  All other systems reviewed and are negative.    Objective: Vital Signs: Blood pressure 169/107, pulse 76, temperature 98.1 F (36.7 C), temperature source Oral, resp. rate 18, height 6' (1.829 m), weight 114.6 kg (252 lb 10.4 oz), SpO2 98.00%. No results found. Results for orders placed during the hospital encounter of 06/27/11 (from the past 72 hour(s))  BASIC METABOLIC PANEL     Status: Abnormal   Collection Time   07/09/11  6:40 AM      Component Value Range Comment   Sodium 135  135 - 145 mEq/L    Potassium 4.1  3.5 - 5.1 mEq/L    Chloride 101  96 - 112 mEq/L    CO2 22  19 - 32 mEq/L    Glucose, Bld 96  70 - 99 mg/dL    BUN 74 (*) 6 - 23 mg/dL    Creatinine, Ser 3.08 (*) 0.50 - 1.35 mg/dL    Calcium 8.5  8.4 - 65.7 mg/dL    GFR calc non Af Amer 12 (*) >90 mL/min    GFR calc Af Amer 14 (*) >90 mL/min   BASIC METABOLIC PANEL     Status: Abnormal   Collection Time   07/10/11  6:40 AM      Component Value Range Comment   Sodium 135  135 - 145 mEq/L    Potassium 3.8  3.5 - 5.1 mEq/L    Chloride 100  96 - 112 mEq/L    CO2 20  19 - 32 mEq/L    Glucose, Bld 77  70 - 99 mg/dL    BUN 73 (*) 6 - 23 mg/dL    Creatinine, Ser 8.46 (*) 0.50 - 1.35 mg/dL    Calcium 8.6  8.4 - 96.2 mg/dL    GFR calc non Af Amer 13 (*) >90 mL/min    GFR calc Af Amer 15 (*) >90 mL/min   CBC     Status: Abnormal   Collection Time   07/10/11  6:40 AM      Component Value Range  Comment   WBC 12.1 (*) 4.0 - 10.5 K/uL    RBC 2.88 (*) 4.22 - 5.81 MIL/uL    Hemoglobin 9.0 (*) 13.0 - 17.0 g/dL    HCT 95.2 (*) 84.1 - 52.0 %    MCV 100.3 (*) 78.0 - 100.0 fL    MCH 31.3  26.0 - 34.0 pg    MCHC 31.1  30.0 - 36.0 g/dL    RDW 32.4 (*) 40.1 - 15.5 %    Platelets 151  150 - 400 K/uL   IRON AND TIBC     Status: Abnormal   Collection Time   07/10/11  6:40 AM      Component Value Range Comment   Iron 61  42 - 135 ug/dL    TIBC 027 (*) 253 - 664 ug/dL  Saturation Ratios 35  20 - 55 %    UIBC 111 (*) 125 - 400 ug/dL   FERRITIN     Status: Abnormal   Collection Time   07/10/11  6:40 AM      Component Value Range Comment   Ferritin 1284 (*) 22 - 322 ng/mL   BASIC METABOLIC PANEL     Status: Abnormal   Collection Time   07/11/11  6:55 AM      Component Value Range Comment   Sodium 136  135 - 145 mEq/L    Potassium 3.8  3.5 - 5.1 mEq/L    Chloride 102  96 - 112 mEq/L    CO2 20  19 - 32 mEq/L    Glucose, Bld 89  70 - 99 mg/dL    BUN 74 (*) 6 - 23 mg/dL    Creatinine, Ser 4.09 (*) 0.50 - 1.35 mg/dL    Calcium 8.6  8.4 - 81.1 mg/dL    GFR calc non Af Amer 13 (*) >90 mL/min    GFR calc Af Amer 15 (*) >90 mL/min   MISCELLANEOUS TEST     Status: Normal   Collection Time   07/11/11  6:55 AM      Component Value Range Comment   Miscellaneous Test        Value: WHOLE BLOOD LAVENDER TO [facility] FOR SIROLIMUS BLOOD TC [id number]   Miscellaneous Test Results Performed at [facility] [place]       HEENT: EOMI, PERRL Heart: RRR, No M,R,G Abdomen: bs+, non-tender. Edema 2+ on either limb.edema approaching his baseline.  Trach stoma pink with granulation with some fibronecrotic tissue.. No distress. Chest remains clear.  Good insight, awareness, memory/ Motor 4/5 in BUE 3 to 3+/5 in B HF,KE, Ankle DF/PF  Assessment/Plan: 1. Functional deficits secondary to critical illness myopathy which require 3+ hours per day of interdisciplinary therapy in a comprehensive inpatient rehab  setting. Physiatrist is providing close team supervision and 24 hour management of active medical problems listed below. Physiatrist and rehab team continue to assess barriers to discharge/monitor patient progress toward functional and medical goals. FIM: FIM - Bathing Bathing Steps Patient Completed: Right Arm;Left Arm;Chest;Abdomen;Front perineal area;Buttocks;Right upper leg;Left upper leg;Right lower leg (including foot);Left lower leg (including foot) Bathing: 5: Supervision: Safety issues/verbal cues  FIM - Upper Body Dressing/Undressing Upper body dressing/undressing steps patient completed: Thread/unthread right sleeve of pullover shirt/dresss;Thread/unthread left sleeve of pullover shirt/dress;Put head through opening of pull over shirt/dress;Pull shirt over trunk Upper body dressing/undressing: 7: Complete Independence: No helper FIM - Lower Body Dressing/Undressing Lower body dressing/undressing steps patient completed: Pull pants up/down;Don/Doff right sock;Don/Doff left sock Lower body dressing/undressing: 4: Min-Patient completed 75 plus % of tasks  FIM - Toileting Toileting steps completed by patient: Adjust clothing prior to toileting;Performs perineal hygiene;Adjust clothing after toileting Toileting Assistive Devices: Grab bar or rail for support Toileting: 4: Steadying assist  FIM - Diplomatic Services operational officer Devices: Elevated toilet seat;Walker Toilet Transfers: 4-To toilet/BSC: Min A (steadying Pt. > 75%);4-From toilet/BSC: Min A (steadying Pt. > 75%)  FIM - Bed/Chair Transfer Bed/Chair Transfer Assistive Devices: Bed rails Bed/Chair Transfer: 5: Bed > Chair or W/C: Supervision (verbal cues/safety issues);5: Chair or W/C > Bed: Supervision (verbal cues/safety issues)  FIM - Locomotion: Wheelchair Distance: 20' Locomotion: Wheelchair: 0: Activity did not occur FIM - Locomotion: Ambulation Locomotion: Ambulation Assistive Devices: Dealer Ambulation/Gait Assistance: 5: Supervision Locomotion: Ambulation: 6: Travels 150 ft or more with assistive device/no helper  Comprehension Comprehension Mode: Auditory Comprehension: 7-Follows complex conversation/direction: With no assist  Expression Expression Mode: Verbal Expression Assistive Devices: 6-Communication board Expression: 7-Expresses complex ideas: With no assist  Social Interaction Social Interaction: 7-Interacts appropriately with others - No medications needed.  Problem Solving Problem Solving: 7-Solves complex problems: Recognizes & self-corrects  Memory Memory: 6-More than reasonable amt of time  Medical Problem List and Plan:  1. DVT Prophylaxis/Anticoagulation: Mechanical: Sequential compression devices, below knee Bilateral lower extremities-ambulating 2. Pain Management: Continue hydrocodone prn  3. Mood: Monitor for now. Seems very motivated.  4. Renal transplant with chronic allograft nephropathy: continue HD on prn basis per nephrology. . Strict I and O. Continue prednisone and sirolimus. Po lasix initiated. Making more urine.  5. Left calf ulcer- continue collagegase dressing with packing 6. Afib: Continue amiodarone. ?coumadin per cardiology-determine plan before dc 7. Anemia of chronic disease:  Continue aranesp weekly. 8. Pulmonary- trach out and no issues whatsoever. Continue dressing /added santyl.  9. Hypokalemia:  supplemented.   LOS (Days) 15 A FACE TO FACE EVALUATION WAS PERFORMED  Richell Corker T 07/12/2011, 6:32 AM

## 2011-07-13 LAB — RENAL FUNCTION PANEL
Albumin: 2.3 g/dL — ABNORMAL LOW (ref 3.5–5.2)
Calcium: 8.7 mg/dL (ref 8.4–10.5)
GFR calc Af Amer: 15 mL/min — ABNORMAL LOW (ref >90)
GFR calc non Af Amer: 13 mL/min — ABNORMAL LOW (ref >90)
Glucose, Bld: 94 mg/dL (ref 70–99)
Phosphorus: 4.1 mg/dL (ref 2.3–4.6)
Potassium: 4.2 mEq/L (ref 3.5–5.1)
Sodium: 135 mEq/L (ref 135–145)

## 2011-07-13 NOTE — Progress Notes (Signed)
Physical Therapy Note  Patient Details  Name: Chad [name] MRN: [id number] Date of Birth: 29-Mar-1955 Today's Date: 07/13/2011  1030-1110 (40 minutes) individual Pain : no complaint of pain Focus of treatment: bilateral LE strengthening Treatment: Gait 150+ feet rollator SBA on unit (controlled environment) ; Kinetron in standing X 3 for 20 reps.Reps limited by dyspnea/fatigue.   [name],JIM 07/13/2011, 11:15 AM

## 2011-07-13 NOTE — Progress Notes (Signed)
Patient ID: Chad [name], male   DOB: [birth year], 56 y.o.   MRN: [id number] Patient ID: Chad [name], male   DOB: 06/09/1955, 56 y.o.   MRN: [id number] Subjective/Complaints: No complaints of SOB or CP. Excited about going home Motivated.  Review of Systems  Respiratory: Negative for cough and shortness of breath.   Cardiovascular: Positive for leg swelling.       Chronic dorsum of L foot  Gastrointestinal: Negative.   Genitourinary:       Good output  Neurological: Positive for focal weakness and weakness.       Both legs weak  All other systems reviewed and are negative.    Objective: Vital Signs: Blood pressure 137/87, pulse 76, temperature 97.8 F (36.6 C), temperature source Oral, resp. rate 18, height 6' (1.829 m), weight 114.669 kg (252 lb 12.8 oz), SpO2 99.00%. No results found. Results for orders placed during the hospital encounter of 06/27/11 (from the past 72 hour(s))  BASIC METABOLIC PANEL     Status: Abnormal   Collection Time   07/11/11  6:55 AM      Component Value Range Comment   Sodium 136  135 - 145 mEq/L    Potassium 3.8  3.5 - 5.1 mEq/L    Chloride 102  96 - 112 mEq/L    CO2 20  19 - 32 mEq/L    Glucose, Bld 89  70 - 99 mg/dL    BUN 74 (*) 6 - 23 mg/dL    Creatinine, Ser 7.82 (*) 0.50 - 1.35 mg/dL    Calcium 8.6  8.4 - 95.6 mg/dL    GFR calc non Af Amer 13 (*) >90 mL/min    GFR calc Af Amer 15 (*) >90 mL/min   MISCELLANEOUS TEST     Status: Normal   Collection Time   07/11/11  6:55 AM      Component Value Range Comment   Miscellaneous Test        Value: WHOLE BLOOD LAVENDER TO [facility] FOR SIROLIMUS BLOOD TC [id number]   Miscellaneous Test Results Performed at [facility]     BASIC METABOLIC PANEL     Status: Abnormal   Collection Time   07/12/11  6:39 AM      Component Value Range Comment   Sodium 134 (*) 135 - 145 mEq/L    Potassium 4.2  3.5 - 5.1 mEq/L    Chloride 101  96 - 112 mEq/L    CO2 18 (*) 19 - 32 mEq/L    Glucose, Bld 80  70 - 99  mg/dL    BUN 73 (*) 6 - 23 mg/dL    Creatinine, Ser 5.78 (*) 0.50 - 1.35 mg/dL    Calcium 8.9  8.4 - 46.9 mg/dL    GFR calc non Af Amer 13 (*) >90 mL/min    GFR calc Af Amer 16 (*) >90 mL/min   RENAL FUNCTION PANEL     Status: Abnormal   Collection Time   07/13/11  6:00 AM      Component Value Range Comment   Sodium 135  135 - 145 mEq/L    Potassium 4.2  3.5 - 5.1 mEq/L    Chloride 102  96 - 112 mEq/L    CO2 21  19 - 32 mEq/L    Glucose, Bld 94  70 - 99 mg/dL    BUN 74 (*) 6 - 23 mg/dL    Creatinine, Ser 6.29 (*) 0.50 - 1.35 mg/dL  Calcium 8.7  8.4 - 10.5 mg/dL    Phosphorus 4.1  2.3 - 4.6 mg/dL    Albumin 2.3 (*) 3.5 - 5.2 g/dL    GFR calc non Af Amer 13 (*) >90 mL/min    GFR calc Af Amer 15 (*) >90 mL/min     HEENT: EOMI, PERRL Heart: RRR, No M,R,G Abdomen: bs+, non-tender. Edema 2+ on either limb.edema approaching his baseline.  Trach stoma pink with granulation with some fibronecrotic tissue.. No distress. Chest remains clear.  Good insight, awareness, memory/ Motor 4/5 in BUE 3 to 3+/5 in B HF,KE, Ankle DF/PF Left calf wound with 15-20 percent fibronecrotic tissue. Still some drainage around. allevyn dressings intact  Assessment/Plan: 1. Functional deficits secondary to critical illness myopathy which require 3+ hours per day of interdisciplinary therapy in a comprehensive inpatient rehab setting. Physiatrist is providing close team supervision and 24 hour management of active medical problems listed below. Physiatrist and rehab team continue to assess barriers to discharge/monitor patient progress toward functional and medical goals. FIM: FIM - Bathing Bathing Steps Patient Completed: Right Arm;Left Arm;Chest;Abdomen;Front perineal area;Buttocks;Right upper leg;Left upper leg;Right lower leg (including foot);Left lower leg (including foot) Bathing: 7: Complete Independence: No helper  FIM - Upper Body Dressing/Undressing Upper body dressing/undressing steps patient  completed: Thread/unthread right sleeve of pullover shirt/dresss;Thread/unthread left sleeve of pullover shirt/dress;Put head through opening of pull over shirt/dress;Pull shirt over trunk;Thread/unthread right sleeve of front closure shirt/dress Upper body dressing/undressing: 7: Complete Independence: No helper FIM - Lower Body Dressing/Undressing Lower body dressing/undressing steps patient completed: Pull underwear up/down;Thread/unthread right pants leg;Don/Doff right sock;Don/Doff left sock;Fasten/unfasten pants;Pull pants up/down;Thread/unthread left pants leg Lower body dressing/undressing: 4: Min-Patient completed 75 plus % of tasks  FIM - Toileting Toileting steps completed by patient: Adjust clothing prior to toileting;Performs perineal hygiene;Adjust clothing after toileting Toileting Assistive Devices: Grab bar or rail for support Toileting: 6: Assistive device: No helper  FIM - Diplomatic Services operational officer Devices: Elevated toilet seat Toilet Transfers: 4-To toilet/BSC: Min A (steadying Pt. > 75%)  FIM - Bed/Chair Transfer Bed/Chair Transfer Assistive Devices: Bed rails Bed/Chair Transfer: 7: Independent: No helper  FIM - Locomotion: Wheelchair Distance: 20' Locomotion: Wheelchair: 0: Activity did not occur FIM - Locomotion: Ambulation Locomotion: Ambulation Assistive Devices: Designer, industrial/product Ambulation/Gait Assistance: 5: Supervision Locomotion: Ambulation: 6: Travels 150 ft or more with assistive device/no helper  Comprehension Comprehension Mode: Auditory Comprehension: 7-Follows complex conversation/direction: With no assist  Expression Expression Mode: Verbal Expression Assistive Devices: 6-Communication board Expression: 7-Expresses complex ideas: With no assist  Social Interaction Social Interaction: 7-Interacts appropriately with others - No medications needed.  Problem Solving Problem Solving: 7-Solves complex problems: Recognizes &  self-corrects  Memory Memory: 7-Complete Independence: No helper  Medical Problem List and Plan:  1. DVT Prophylaxis/Anticoagulation: Mechanical: Sequential compression devices, below knee Bilateral lower extremities-ambulating 2. Pain Management: Continue hydrocodone prn  3. Mood: Monitor for now. Seems very motivated.  4. Renal transplant with chronic allograft nephropathy: continue HD on prn basis per nephrology. . Strict I and O. Continue prednisone and sirolimus. Po lasix initiated. Making more urine.  5. Left calf ulcer- continue collagegase with allevyn. i personally think this dressing is not absorbing enough. May be useful to use a dry dressing under allevyn with collagenase. 6. Afib: Continue amiodarone. ?coumadin per cardiology-determine plan before dc 7. Anemia of chronic disease:  Continue aranesp weekly. 8. Pulmonary- trach out and no issues whatsoever. Continue dressing /added santyl.  9. Hypokalemia:  supplemented.  LOS (Days) 16 A FACE TO FACE EVALUATION WAS PERFORMED  Ovide Dusek T 07/13/2011, 7:36 AM

## 2011-07-14 LAB — RENAL FUNCTION PANEL
Albumin: 2.3 g/dL — ABNORMAL LOW (ref 3.5–5.2)
CO2: 20 mEq/L (ref 19–32)
Calcium: 8.6 mg/dL (ref 8.4–10.5)
Creatinine, Ser: 4.49 mg/dL — ABNORMAL HIGH (ref 0.50–1.35)
GFR calc Af Amer: 16 mL/min — ABNORMAL LOW (ref >90)
GFR calc non Af Amer: 14 mL/min — ABNORMAL LOW (ref >90)
Phosphorus: 3.8 mg/dL (ref 2.3–4.6)
Sodium: 138 mEq/L (ref 135–145)

## 2011-07-14 NOTE — Progress Notes (Signed)
Physical Therapy Note  Patient Details  Name: Chad [name] MRN: [id number] Date of Birth: 04-16-1955 Today's Date: 07/14/2011  Time: [phone] 60 minutes  No c/o pain.  Gait with rollator on unit and to solarium negotiating carpet and outdoor surfaces, opening doors, controlling elevators all with mod I.  Pt improving skill with rollator and expresses comfort moving around with this device.  Sit to stand from low, soft chair with min A.  Educated pt on smart seating choices at home.  Kitchen mobility setting table, emptying dishwasher without AD with supervision.  Discussed energy conservation and safety for kitchen mobility at home, pt expresses understanding.  Stair training 5 stairs with mod I, pt states he feels very comfortable with stairs for home.  Discussed pt having help with carrying objects up/down stairs at home due to pt with some UE shakiness and decreased balance reactions.  Pt states he feels ready for d/c home on Wednesday.  Individual therapy   Adelbert Gaspard 07/14/2011, 11:44 AM

## 2011-07-14 NOTE — Progress Notes (Signed)
Patient ID: Chad [name], male   DOB: 08-Nov-1955, 56 y.o.   MRN: [id number]   [facility] Progress Note    Subjective:   No acute complaints-  Looks much better than the last time I saw him.  No HD since 5/30   Objective:   BP 140/80  Pulse 88  Temp 97.9 F (36.6 C) (Oral)  Resp 18  Ht 6' (1.829 m)  Wt 114.669 kg (252 lb 12.8 oz)  BMI 34.29 kg/m2  SpO2 99%  Intake/Output Summary (Last 24 hours) at 07/14/11 1025 Last data filed at 07/14/11 0900  Gross per 24 hour  Intake    600 ml  Output    750 ml  Net   -150 ml   Weight change: 0 kg (0 lb)  Physical Exam: [name]:NFAOZHYQMVH up in wheelchair- at sink washing face QIO:NGEXB RRR, normal S1 and S2  Resp:CTA bilaterally, no rales MWU:XLKG, obese, NT, BS normal MWN:UUVOZDG 2+ LE edema  Imaging: No results found.  Labs: BMET  Lab 07/14/11 0600 07/13/11 0600 07/12/11 0639 07/11/11 0655 07/10/11 0640 07/09/11 0640 07/08/11 0640  NA 138 135 134* 136 135 135 137  K 4.0 4.2 4.2 3.8 3.8 4.1 3.7  CL 104 102 101 102 100 101 101  CO2 20 21 18* 20 20 22 21   GLUCOSE 79 94 80 89 77 96 87  BUN 73* 74* 73* 74* 73* 74* 72*  CREATININE 4.49* 4.67* 4.54* 4.72* 4.61* 4.83* 4.58*  ALB -- -- -- -- -- -- --  CALCIUM 8.6 8.7 8.9 8.6 8.6 8.5 8.5  PHOS 3.8 4.1 -- -- -- -- --   CBC  Lab 07/10/11 0640  WBC 12.1*  NEUTROABS --  HGB 9.0*  HCT 28.9*  MCV 100.3*  PLT 151    Medications:       . amiodarone  200 mg Oral Daily  . antiseptic oral rinse  15 mL Mouth Rinse QID  . calcitRIOL  0.25 mcg Oral Daily  . carvedilol  3.125 mg Oral BID WC  . chlorhexidine  15 mL Mouth Rinse BID  . collagenase   Topical Daily  . darbepoetin (ARANESP) injection - NON-DIALYSIS  100 mcg Subcutaneous Q Thu-1800  . pantoprazole  40 mg Oral BID AC  . potassium chloride  10 mEq Oral Daily  . predniSONE  10 mg Oral Q breakfast  . sirolimus  1.5 mg Oral Daily     Assessment/ Plan:   1. 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 Continues to show some fluctuations in Scr and fair UOP but mainly stuck in the mid 4's  no acute HD needs. Has tunneled HD cath R IJ in place for use as needed, not used since 5/30 or flushed, will remove.  Sirolimus level 4.1- (target level is 5-8), increased dose to 1.5 mg and levels pending. Will need to solidify follow up plans, patient will be discharged on Wednesday.  2. Critical Illness/ Myopathy: undergoing physical rehabilitation  3. Anemia on Aranesp- Iron stores replete- hgb 9.0 4. Sec HPTH: on Calcitriol  5. HTN: start low dose carvedilol 3.125mg  PO BID today.  Consider chronic lasix at time of discharge.     [name]  07/14/2011, 10:25 AM

## 2011-07-14 NOTE — Progress Notes (Signed)
Occupational Therapy Session Note  Patient Details  Name: Chad [name] MRN: [id number] Date of Birth: 07-12-1955  Today's Date: 07/14/2011 Time: 0830-0930 Time Calculation (min): 60 min  Short Term Goals: Week 3:  OT Short Term Goal 1 (Week 3): STG= LTG OT Short Term Goal 2 (Week 3): Pt will participate in community outing  Skilled Therapeutic Interventions/Progress Updates:    1:1 self care retraining at sink: completed mod I, sit to stand, functional ambulation short distances with out AD, developed goals for outing tomorrow (going to [employer]), getting in and out of elevated bed, discussed options for leg lifters (belt fasten as a loop and/or using a sheet looped around foot and practiced..   Therapy Documentation Precautions:  Precautions Precautions: Fall Restrictions Weight Bearing Restrictions: No Vital Signs: Therapy Vitals Temp: 97.9 F (36.6 C) Temp src: Oral Pulse Rate: 88  BP: 140/80 mmHg Patient Position, if appropriate: Lying Oxygen Therapy SpO2: 99 % O2 Device: None (Room air) Pain:  no c/o pain  See FIM for current functional status  Therapy/Group: Individual Therapy  Roney Mans [facility] 07/14/2011, 9:29 AM

## 2011-07-14 NOTE — Progress Notes (Signed)
[facility] SHORT STAY H&P  CC:  Catheter removal   HPI:  A 56 year old gentleman with history of renal transplant who has been doing fine coming in with 3 days of cough, shortness of breath and fever. He has been on immunosuppressants for his renal transplant. Patient has not had any known sick contacts. He had associated nausea and vomiting but no diarrhea no constipation no bright red blood per rectum. He has bilateral lymphedema which has remained for the most part within range of what he normally has. He has some slight chest discomfort but not frank chest pain. He is having cough which is slightly productive of white sputum. In the ED patient was found to be severely hypoxic requiring nonrebreather bag. His chest x-ray showed findings consistent with a pneumonia.  Emergent bronch done, bloody secretions and pus throughout 06-04-2011. He was started on antibiotics for for empiric fungal and PCP treatment.  We are being consulted for dialysis access. Regarding renal transplant, continuing his home dose of sirolimus and stress dose steroids which were decreased 5/15. Baseline function was not great with creatinine of 2. Minimal UOP, may not recover. Still on steroids and sirolimus. Will need tunnelled catheter later this week if possible when CCM feels able   He had a tunneled diatek catheter placed 06/20/11.  It has not been used since 06/26/11.  We are asked to remove the catheter.  Past Medical History  Diagnosis Date  . Hypertension   . H/O kidney transplant   . Hepatitis   . Chronic kidney disease     FH:  Non-Contributory  History   Social History  . Marital Status: Single    Spouse Name: N/A    Number of Children: N/A  . Years of Education: N/A   Occupational History  . Not on file.   Social History Main Topics  . Smoking status: Never Smoker   . Smokeless tobacco: Not on file  . Alcohol Use: No  . Drug Use:   . Sexually Active:    Other Topics Concern    . Not on file   Social History Narrative  . No narrative on file    No Known Allergies  Current Facility-Administered Medications  Medication Dose Route Frequency Provider Last Rate Last Dose  . acetaminophen (TYLENOL) tablet 325-650 mg  325-650 mg Oral Q4H PRN Evlyn Kanner Love, PA      . albuterol (PROVENTIL) (5 MG/ML) 0.5% nebulizer solution 2.5 mg  2.5 mg Nebulization Q3H PRN Jacquelynn Cree, PA      . aluminum hydroxide (AMPHOJEL/ALTERNAGEL) suspension 30 mL  30 mL Oral Q6H PRN Jacquelynn Cree, PA      . amiodarone (PACERONE) tablet 200 mg  200 mg Oral Daily Evlyn Kanner Love, PA   200 mg at 07/14/11 4098  . antiseptic oral rinse (BIOTENE) solution 15 mL  15 mL Mouth Rinse QID Evlyn Kanner Love, PA   15 mL at 07/12/11 1834  . bisacodyl (DULCOLAX) suppository 10 mg  10 mg Rectal Daily PRN Jacquelynn Cree, PA      . calcitRIOL (ROCALTROL) capsule 0.25 mcg  0.25 mcg Oral Daily Dyke Maes, MD   0.25 mcg at 07/14/11 1191  . carvedilol (COREG) tablet 3.125 mg  3.125 mg Oral BID WC Jay K. Allena Katz, MD   3.125 mg at 07/14/11 0928  . chlorhexidine (PERIDEX) 0.12 % solution 15 mL  15 mL Mouth Rinse BID Evlyn Kanner Love, PA   15 mL  at 07/14/11 0929  . collagenase (SANTYL) ointment   Topical Daily Ranelle Oyster, MD      . darbepoetin [facility]) injection 100 mcg  100 mcg Subcutaneous Q Thu-1800 Evlyn Kanner Love, PA   100 mcg at 07/10/11 1754  . diphenhydrAMINE (BENADRYL) 12.5 MG/5ML elixir 12.5-25 mg  12.5-25 mg Oral Q6H PRN Jacquelynn Cree, PA      . feeding supplement (NEPRO CARB STEADY) liquid 237 mL  237 mL Oral Daily PRN Evlyn Kanner Love, PA      . guaiFENesin-dextromethorphan (ROBITUSSIN DM) 100-10 MG/5ML syrup 5-10 mL  5-10 mL Oral Q6H PRN Jacquelynn Cree, PA      . HYDROcodone-acetaminophen (NORCO) 5-325 MG per tablet 1-2 tablet  1-2 tablet Oral Q4H PRN Jacquelynn Cree, PA      . methocarbamol (ROBAXIN) tablet 500 mg  500 mg Oral Q6H PRN Jacquelynn Cree, PA      . ondansetron [facility]) tablet 4 mg  4 mg Oral Q6H  PRN Jacquelynn Cree, PA       Or  . ondansetron (ZOFRAN) injection 4 mg  4 mg Intravenous Q6H PRN Jacquelynn Cree, PA      . pantoprazole (PROTONIX) EC tablet 40 mg  40 mg Oral BID AC Evlyn Kanner Love, PA   40 mg at 07/14/11 0617  . polyethylene glycol (MIRALAX / GLYCOLAX) packet 17 g  17 g Oral Daily PRN Evlyn Kanner Love, PA      . potassium chloride (K-DUR,KLOR-CON) CR tablet 10 mEq  10 mEq Oral Daily Lindley Magnus, MD   10 mEq at 07/14/11 0928  . predniSONE (DELTASONE) tablet 10 mg  10 mg Oral Q breakfast Ranelle Oyster, MD   10 mg at 07/14/11 0927  . sirolimus (RAPAMUNE) 1 MG/ML solution 1.5 mg  1.5 mg Oral Daily Jay K. Allena Katz, MD   1.5 mg at 07/14/11 0927  . traZODone (DESYREL) tablet 25-50 mg  25-50 mg Oral QHS PRN Evlyn Kanner Love, PA        ROS:  See HPI  PHYSICAL EXAM  Filed Vitals:   07/14/11 0927  BP: 140/80  Pulse: 88  Temp:   Resp:     Gen:  NAD HEENT:  Normocephalic Neck:  Right IJ diatek Heart:  regular Lungs:  Non-labored Abdomen:  Soft NT/ND Skin:  No obvious rashes/ulcers Neuro:  In tact  Lab/X-ray:  none  Impression: This is a 56 y.o. male here for diatek catheter removal  Plan:  Removal of right diatek catheter  Doreatha Massed, PA-C [facility] [phone] 07/14/2011 2:05 PM

## 2011-07-14 NOTE — Progress Notes (Signed)
[facility] Catheter Removal Procedure Note  Diagnosis: ESRD with Functioning AVF/AVGG  Plan:  Remove right diatek catheter  Consent signed:  yes Time out completed:  yes Coumadin:  no PT/INR (if applicable):   Other labs:   Procedure: 1.  Sterile prepping and draping over catheter area 2. 0 ml 2% lidocaine plain instilled at removal site. 3.  right catheter removed in its entirety with cuff in tact. 4.  Complications: none  5. Tip of catheter sent for culture:  no   Patient tolerated procedure well:  yes Pressure held, no bleeding noted, dressing applied Instructions given to the pt regarding wound care and bleeding.  OtherClinton Gallant [facility] 07/14/2011 4:52 PM

## 2011-07-14 NOTE — Progress Notes (Signed)
Occupational Therapy Session Note  Patient Details  Name: ODAY RIDINGS MRN: [id number] Date of Birth: [birth year]  Today's Date: 07/14/2011 Time: 1330-1400 Time Calculation (min): 30 min  Short Term Goals: Week 3:  OT Short Term Goal 1 (Week 3): STG= LTG OT Short Term Goal 2 (Week 3): Pt will participate in community outing  Skilled Therapeutic Interventions/Progress Updates:    1:1 focus on activity tolerance/ endurance with functional ambulation in door and out doors on different turfs/flooring, crossing thresholds, uneven pathways, navigating hills with Rollator, flight of stairs using bilateral rails for endurance training in prep for returning to his home environment to perform IADLs  Therapy Documentation Precautions:  Precautions Precautions: Fall Restrictions Weight Bearing Restrictions: No Pain:  no c/o pain  See FIM for current functional status  Therapy/Group: Individual Therapy  Roney Mans [facility] 07/14/2011, 3:41 PM

## 2011-07-14 NOTE — Progress Notes (Signed)
Subjective/Complaints: No complaints of SOB or CP. Excited about going home tomorrow.  Motivated.  Review of Systems  Respiratory: Negative for cough and shortness of breath.   Cardiovascular: Positive for leg swelling.       Chronic dorsum of L foot  Gastrointestinal: Negative.   Genitourinary:       Good output  Neurological: Positive for focal weakness and weakness.       Both legs weak  All other systems reviewed and are negative.    Objective: Vital Signs: Blood pressure 152/85, pulse 71, temperature 97.9 F (36.6 C), temperature source Oral, resp. rate 18, height 6' (1.829 m), weight 114.669 kg (252 lb 12.8 oz), SpO2 99.00%. No results found. Results for orders placed during the hospital encounter of 06/27/11 (from the past 72 hour(s))  BASIC METABOLIC PANEL     Status: Abnormal   Collection Time   07/12/11  6:39 AM      Component Value Range Comment   Sodium 134 (*) 135 - 145 mEq/L    Potassium 4.2  3.5 - 5.1 mEq/L    Chloride 101  96 - 112 mEq/L    CO2 18 (*) 19 - 32 mEq/L    Glucose, Bld 80  70 - 99 mg/dL    BUN 73 (*) 6 - 23 mg/dL    Creatinine, Ser 7.82 (*) 0.50 - 1.35 mg/dL    Calcium 8.9  8.4 - 95.6 mg/dL    GFR calc non Af Amer 13 (*) >90 mL/min    GFR calc Af Amer 16 (*) >90 mL/min   RENAL FUNCTION PANEL     Status: Abnormal   Collection Time   07/13/11  6:00 AM      Component Value Range Comment   Sodium 135  135 - 145 mEq/L    Potassium 4.2  3.5 - 5.1 mEq/L    Chloride 102  96 - 112 mEq/L    CO2 21  19 - 32 mEq/L    Glucose, Bld 94  70 - 99 mg/dL    BUN 74 (*) 6 - 23 mg/dL    Creatinine, Ser 2.13 (*) 0.50 - 1.35 mg/dL    Calcium 8.7  8.4 - 08.6 mg/dL    Phosphorus 4.1  2.3 - 4.6 mg/dL    Albumin 2.3 (*) 3.5 - 5.2 g/dL    GFR calc non Af Amer 13 (*) >90 mL/min    GFR calc Af Amer 15 (*) >90 mL/min   RENAL FUNCTION PANEL     Status: Abnormal   Collection Time   07/14/11  6:00 AM      Component Value Range Comment   Sodium 138  135 - 145 mEq/L    Potassium 4.0  3.5 - 5.1 mEq/L    Chloride 104  96 - 112 mEq/L    CO2 20  19 - 32 mEq/L    Glucose, Bld 79  70 - 99 mg/dL    BUN 73 (*) 6 - 23 mg/dL    Creatinine, Ser 5.78 (*) 0.50 - 1.35 mg/dL    Calcium 8.6  8.4 - 46.9 mg/dL    Phosphorus 3.8  2.3 - 4.6 mg/dL    Albumin 2.3 (*) 3.5 - 5.2 g/dL    GFR calc non Af Amer 14 (*) >90 mL/min    GFR calc Af Amer 16 (*) >90 mL/min     HEENT: EOMI, PERRL Heart: RRR, No M,R,G Abdomen: bs+, non-tender. Edema 2+ on either limb.edema approaching his baseline.  Trach stoma pink granulation tissue.. No distress. Chest remains clear.  Good insight, awareness, memory/ Motor 4/5 in BUE 3 to 3+/5 in B HF,KE, Ankle DF/PF Left calf wound with improvement.  Still some drainage around. allevyn dressings intact  Assessment/Plan: 1. Functional deficits secondary to critical illness myopathy which require 3+ hours per day of interdisciplinary therapy in a comprehensive inpatient rehab setting. Physiatrist is providing close team supervision and 24 hour management of active medical problems listed below. Physiatrist and rehab team continue to assess barriers to discharge/monitor patient progress toward functional and medical goals. FIM: FIM - Bathing Bathing Steps Patient Completed: Right Arm;Left Arm;Chest;Abdomen;Front perineal area;Buttocks;Right upper leg;Left upper leg;Right lower leg (including foot);Left lower leg (including foot) Bathing: 7: Complete Independence: No helper  FIM - Upper Body Dressing/Undressing Upper body dressing/undressing steps patient completed: Thread/unthread right sleeve of pullover shirt/dresss;Thread/unthread left sleeve of pullover shirt/dress;Put head through opening of pull over shirt/dress;Pull shirt over trunk;Thread/unthread right sleeve of front closure shirt/dress Upper body dressing/undressing: 7: Complete Independence: No helper FIM - Lower Body Dressing/Undressing Lower body dressing/undressing steps patient  completed: Thread/unthread right underwear leg;Thread/unthread left underwear leg;Pull underwear up/down;Thread/unthread right pants leg;Don/Doff left sock;Don/Doff right shoe;Fasten/unfasten pants;Don/Doff right sock;Pull pants up/down;Thread/unthread left pants leg;Don/Doff left shoe Lower body dressing/undressing: 7: Complete Independence: No helper  FIM - Toileting Toileting steps completed by patient: Adjust clothing prior to toileting;Performs perineal hygiene;Adjust clothing after toileting Toileting Assistive Devices: Grab bar or rail for support Toileting: 6: Assistive device: No helper  FIM - Diplomatic Services operational officer Devices: Elevated toilet seat Toilet Transfers: 4-From toilet/BSC: Min A (steadying Pt. > 75%)  FIM - Bed/Chair Transfer Bed/Chair Transfer Assistive Devices: Bed rails Bed/Chair Transfer: 7: Independent: No helper  FIM - Locomotion: Wheelchair Distance: 20' Locomotion: Wheelchair: 0: Activity did not occur FIM - Locomotion: Ambulation Locomotion: Ambulation Assistive Devices: Designer, industrial/product Ambulation/Gait Assistance: 5: Supervision Locomotion: Ambulation: 6: Travels 150 ft or more independently/takes more than reasonable amount of time  Comprehension Comprehension Mode: Auditory Comprehension: 7-Follows complex conversation/direction: With no assist  Expression Expression Mode: Verbal Expression Assistive Devices: 6-Communication board Expression: 7-Expresses complex ideas: With no assist  Social Interaction Social Interaction: 7-Interacts appropriately with others - No medications needed.  Problem Solving Problem Solving: 7-Solves complex problems: Recognizes & self-corrects  Memory Memory: 7-Complete Independence: No helper  Medical Problem List and Plan:  1. DVT Prophylaxis/Anticoagulation: Mechanical: Sequential compression devices, below knee Bilateral lower extremities-ambulating 2. Pain Management: Continue hydrocodone  prn  3. Mood: Monitor for now. Seems very motivated.  4. Renal transplant with chronic allograft nephropathy: continue HD on prn basis per nephrology. . Strict I and O. Continue prednisone and sirolimus. Po lasix initiated. Making more urine. Cr around 4.4 5. Left calf ulcer- continue collagegase with allevyn. i personally think this dressing is not absorbing enough. May be useful to use a dry dressing under allevyn with collagenase. 6. Afib: Continue amiodarone. ?coumadin per cardiology-determine plan before dc 7. Anemia of chronic disease:  Continue aranesp weekly. 8. Pulmonary- trach out and no issues whatsoever. Continue dressing /added santyl-likely wont need santyl at home. 9. Hypokalemia:  supplemented.   LOS (Days) 17 A FACE TO FACE EVALUATION WAS PERFORMED  Lagena Strand T 07/14/2011, 8:32 AM

## 2011-07-14 NOTE — Progress Notes (Signed)
Physical Therapy Note  Patient Details  Name: Chad [name] MRN: [id number] Date of Birth: 02/08/1955 Today's Date: 07/14/2011  Time: 1425-1437 12 minutes  No c/o pain.  Pt treatment postponed due to pt having procedure.  After procedure pt reports increased fatigue, unable to participate in active physical therapy at this time.  Reviewed HEP with pt with handouts, pt states he feels comfortable performing on his own when he feels rested.  Individual therapy   Jerritt Cardoza 07/14/2011, 2:45 PM

## 2011-07-14 NOTE — Progress Notes (Signed)
Recreational Therapy Session Note  Patient Details  Name: Chad [name] MRN: [id number] Date of Birth: [birth year] Today's Date: 07/14/2011 Time: 930-10 Pain: no c/o Skilled Therapeutic Interventions/Progress Updates: Ambulated from room to 3rd floor Solarium using rollator without rest break.  Once in solarium, pt took seated rest break ~3 minutes before walking outside on balcony for horticultural therapy task.  Pt ambulated with with Mod I.  Discussed planned outing for tomorrow, pt agreeable to participate.  Therapy/Group: Co-Treatment   Taeshawn Helfman 07/14/2011, 10:10 AM

## 2011-07-15 DIAGNOSIS — G7281 Critical illness myopathy: Secondary | ICD-10-CM

## 2011-07-15 DIAGNOSIS — Z5189 Encounter for other specified aftercare: Secondary | ICD-10-CM

## 2011-07-15 LAB — RENAL FUNCTION PANEL
Albumin: 2.3 g/dL — ABNORMAL LOW (ref 3.5–5.2)
BUN: 68 mg/dL — ABNORMAL HIGH (ref 6–23)
Calcium: 8.5 mg/dL (ref 8.4–10.5)
Chloride: 104 mEq/L (ref 96–112)
Creatinine, Ser: 4.32 mg/dL — ABNORMAL HIGH (ref 0.50–1.35)
GFR calc non Af Amer: 14 mL/min — ABNORMAL LOW (ref >90)
Phosphorus: 3.5 mg/dL (ref 2.3–4.6)

## 2011-07-15 MED ORDER — FUROSEMIDE 40 MG PO TABS
40.0000 mg | ORAL_TABLET | Freq: Two times a day (BID) | ORAL | Status: DC
  Administered 2011-07-15 – 2011-07-16 (×3): 40 mg via ORAL
  Filled 2011-07-15 (×5): qty 1

## 2011-07-15 NOTE — Progress Notes (Signed)
Recreational Therapy Discharge Summary Patient Details  Name: Chad [name] MRN: [id number] Date of Birth: 1955-07-16 Today's Date: 07/15/2011  Long term goals set: 2  Long term goals met: 2  Comments on progress toward goals: Pt has made excellent progress toward goals and is ready for discharge home at Mod I.  Pt demonstrates good safety awareness and is able to identify opportunities for energy conservation.  Pt is extremely motivated to regain further independence.  Reasons for discharge: discharge from hospital  Patient/family agrees with progress made and goals achieved: Yes  [name] 07/15/2011, 3:04 PM

## 2011-07-15 NOTE — Patient Care Conference (Signed)
Inpatient RehabilitationTeam Conference Note Date: 07/15/2011   Time: 2:50 PM    Patient Name: Chad [name]      Medical Record Number: [id number]  Date of Birth: December 27, 1955 Sex: Male         Room/Bed: 4037/4037-01 Payor Info: Payor: BLUE CROSS BLUE SHIELD  Plan: BCBS PPO OUT OF STATE  Product Type: *No Product type*     Admitting Diagnosis: CRITICAL ILLNESS MYOPATHY [facility]   Admit Date/Time:  06/27/2011  5:14 PM Admission Comments: No comment available   Primary Diagnosis:  Critical illness myopathy Principal Problem: Critical illness myopathy  Patient Active Problem List   Diagnosis Date Noted  . 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 Septic shock 06/10/2011  . Acute renal failure 06/10/2011  . Cardiomyopathy 06/10/2011  . Acute systolic heart failure 06/09/2011  . Renal transplant disorder 06/05/2011  . HCAP (healthcare-associated pneumonia) 06/04/2011  . Thrombocytopenia 06/04/2011  . Acute respiratory failure 06/04/2011  . ARDS (adult respiratory distress syndrome) 06/04/2011    Expected Discharge Date: Expected Discharge Date: 07/16/11  Team Members Present: Physician: Dr. Faith Rogue Case Manager Present: Melanee Spry, RN Social Worker Present: Dossie Der, LCSW Nurse Present: Carmie End, RN PT Present: Edson Snowball, PT;Chris Larina Bras, PT OT Present: Roney Mans, OT;Ardis Rowan, COTA SLP Present: Feliberto Gottron, SLP Other (Discipline and Name): Tora Duck, PPS Coordinator     Current Status/Progress Goal Weekly Team Focus  Medical   wound care, urine output consistent  see prior  no change   Bowel/Bladder   continent bowel and bladder lbm 6-15  mod independence   continue with plan of care   Swallow/Nutrition/ Hydration             ADL's   mod I  mod I   outing and discharge   Mobility   mod I  mod I  community outing   Water quality scientist Observations  mod independence in room with walker  mod  independent      Pain   n/a         Skin   blister burst left posterior calf allevyn dsg intact left calf wound red allevyn dsg intact changed every day old trach site unapproximated healing dry dsg intact   no new breakdown  educate on dsg       *See Interdisciplinary Assessment and Plan and progress notes for long and short-term goals  Barriers to Discharge: none    Possible Resolutions to Barriers:  none    Discharge Planning/Teaching Needs:  Home with family-girlfriend to assist, pt did very well here      Team Discussion: Goals met--ready for d/c Wednesday   Revisions to Treatment Plan: none    Continued Need for Acute Rehabilitation Level of Care: The patient requires daily medical management by a physician with specialized training in physical medicine and rehabilitation for the following conditions: Daily direction of a multidisciplinary physical rehabilitation program to ensure safe treatment while eliciting the highest outcome that is of practical value to the patient.: Yes Daily medical management of patient stability for increased activity during participation in an intensive rehabilitation regime.: Yes Daily analysis of laboratory values and/or radiology reports with any subsequent need for medication adjustment of medical intervention for : Pulmonary problems;Post surgical problems;Other  Chad [name] 07/15/2011, 5:24 PM

## 2011-07-15 NOTE — Progress Notes (Signed)
Patient ID: Chad [name], male   DOB: 1955-02-16, 56 y.o.   MRN: [id number]   [facility] Progress Note    Subjective:   No acute complaints-    No HD since 5/30, PC was removed yesterday   Objective:   BP 147/83  Pulse 104  Temp 98.8 F (37.1 C) (Oral)  Resp 18  Ht 6' (1.829 m)  Wt 123.3 kg (271 lb 13.2 oz)  BMI 36.87 kg/m2  SpO2 97%  Intake/Output Summary (Last 24 hours) at 07/15/11 0930 Last data filed at 07/15/11 5784  Gross per 24 hour  Intake    840 ml  Output    625 ml  Net    215 ml   Weight change: 8.631 kg (19 lb 0.4 oz)  Physical Exam: ONG:[id number] up in wheelchair WNU:UVOZD RRR, normal S1 and S2  Resp:CTA bilaterally, no rales GUY:QIHK, obese, NT, BS normal VQQ:VZDGLOV 2+ LE edema  Imaging: No results found.  Labs: BMET  Lab 07/15/11 0500 07/14/11 0600 07/13/11 0600 07/12/11 0639 07/11/11 0655 07/10/11 0640 07/09/11 0640  NA 136 138 135 134* 136 135 135  K 4.1 4.0 4.2 4.2 3.8 3.8 4.1  CL 104 104 102 101 102 100 101  CO2 19 20 21  18* 20 20 22   GLUCOSE 82 79 94 80 89 77 96  BUN 68* 73* 74* 73* 74* 73* 74*  CREATININE 4.32* 4.49* 4.67* 4.54* 4.72* 4.61* 4.83*  ALB -- -- -- -- -- -- --  CALCIUM 8.5 8.6 8.7 8.9 8.6 8.6 8.5  PHOS 3.5 3.8 4.1 -- -- -- --   CBC  Lab 07/10/11 0640  WBC 12.1*  NEUTROABS --  HGB 9.0*  HCT 28.9*  MCV 100.3*  PLT 151    Medications:       . amiodarone  200 mg Oral Daily  . antiseptic oral rinse  15 mL Mouth Rinse QID  . calcitRIOL  0.25 mcg Oral Daily  . carvedilol  3.125 mg Oral BID WC  . chlorhexidine  15 mL Mouth Rinse BID  . collagenase   Topical Daily  . darbepoetin (ARANESP) injection - NON-DIALYSIS  100 mcg Subcutaneous Q Thu-1800  . pantoprazole  40 mg Oral BID AC  . potassium chloride  10 mEq Oral Daily  . predniSONE  10 mg Oral Q breakfast  . sirolimus  1.5 mg Oral Daily     Assessment/ Plan:   1. 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 Continues to  show some fluctuations in Scr and fair UOP but mainly stuck in the mid 4's but maybe improving ?? No HD since 5/30, PC removed. Last Sirolimus level 4.3- (target level is 5-8), increased dose to 1.5 mg and new level pending. Still on track for d/c tomorrow.  Will plan for OP labs thru our office on 6/24 and then follow up with Dr. Caryn Section on July 3rd at 1:45 PM, pt informed 2. Critical Illness/ Myopathy: undergoing physical rehabilitation  3. Anemia on Aranesp here, will evaluate for continued need thru our office- Iron stores replete- hgb 9.0 4. Sec HPTH: on Calcitriol  5. HTN: start low dose carvedilol 3.125mg  PO BID today.  Will start lasix at 40 PO BID for discharge   Renal will sign off and not see patient tomorrow, call if any questions for d/c   Tesha Archambeau A  07/15/2011, 9:30 AM

## 2011-07-15 NOTE — Progress Notes (Signed)
Occupational Therapy Session Note  Patient Details  Name: Chad [name] MRN: [id number] Date of Birth: Jun 19, 1955  Today's Date: 07/15/2011 Time: 1000-1200 Time Calculation (min): 120 min  Pt seen for [employer] with [employer] to [employer] with focus on functional mobility, energy conservation, and safety with mobility and obtaining/carrying items.  See Community Outing Goal Sheet in [place] Chart for additional information.  Leonette Monarch 07/15/2011, 1:16 PM

## 2011-07-15 NOTE — Progress Notes (Signed)
Occupational Therapy Session Note  Patient Details  Name: CHUKWUEBUKA CHURCHILL MRN: [id number] Date of Birth: [birth year]  Today's Date: 07/15/2011 Time: 0930-1000 Time Calculation (min): 30 min  Short Term Goals: Week 3:  OT Short Term Goal 1 (Week 3): STG= LTG OT Short Term Goal 2 (Week 3): Pt will participate in community outing  Skilled Therapeutic Interventions/Progress Updates:    1:1 focus on d/c planning, safe home setup, returning to community, decisions about driving, energy conservation, trying out seat on new Rollator, decided to order a 3:1 to go over toilet for increased height for sit to stands  Therapy Documentation Precautions:  Precautions Precautions: Fall Restrictions Weight Bearing Restrictions: No Pain:  no c/o  See FIM for current functional status  Therapy/Group: Individual Therapy  Roney Mans [facility] 07/15/2011, 2:31 PM

## 2011-07-15 NOTE — Progress Notes (Signed)
Patient ID: Chad [name], male   DOB: 1955/12/20, 56 y.o.   MRN: [id number] Subjective/Complaints: No complaints of SOB or CP. Excited about going home. Motivated.  Review of Systems  Respiratory: Negative for cough and shortness of breath.   Cardiovascular: Positive for leg swelling.       Chronic dorsum of L foot  Gastrointestinal: Negative.   Genitourinary:       Good output  Neurological: Positive for focal weakness and weakness.       Both legs weak  All other systems reviewed and are negative.    Objective: Vital Signs: Blood pressure 147/83, pulse 79, temperature 98.8 F (37.1 C), temperature source Oral, resp. rate 18, height 6' (1.829 m), weight 123.3 kg (271 lb 13.2 oz), SpO2 97.00%. No results found. Results for orders placed during the hospital encounter of 06/27/11 (from the past 72 hour(s))  RENAL FUNCTION PANEL     Status: Abnormal   Collection Time   07/13/11  6:00 AM      Component Value Range Comment   Sodium 135  135 - 145 mEq/L    Potassium 4.2  3.5 - 5.1 mEq/L    Chloride 102  96 - 112 mEq/L    CO2 21  19 - 32 mEq/L    Glucose, Bld 94  70 - 99 mg/dL    BUN 74 (*) 6 - 23 mg/dL    Creatinine, Ser 5.62 (*) 0.50 - 1.35 mg/dL    Calcium 8.7  8.4 - 13.0 mg/dL    Phosphorus 4.1  2.3 - 4.6 mg/dL    Albumin 2.3 (*) 3.5 - 5.2 g/dL    GFR calc non Af Amer 13 (*) >90 mL/min    GFR calc Af Amer 15 (*) >90 mL/min   RENAL FUNCTION PANEL     Status: Abnormal   Collection Time   07/14/11  6:00 AM      Component Value Range Comment   Sodium 138  135 - 145 mEq/L    Potassium 4.0  3.5 - 5.1 mEq/L    Chloride 104  96 - 112 mEq/L    CO2 20  19 - 32 mEq/L    Glucose, Bld 79  70 - 99 mg/dL    BUN 73 (*) 6 - 23 mg/dL    Creatinine, Ser 8.65 (*) 0.50 - 1.35 mg/dL    Calcium 8.6  8.4 - 78.4 mg/dL    Phosphorus 3.8  2.3 - 4.6 mg/dL    Albumin 2.3 (*) 3.5 - 5.2 g/dL    GFR calc non Af Amer 14 (*) >90 mL/min    GFR calc Af Amer 16 (*) >90 mL/min   RENAL FUNCTION PANEL      Status: Abnormal   Collection Time   07/15/11  5:00 AM      Component Value Range Comment   Sodium 136  135 - 145 mEq/L    Potassium 4.1  3.5 - 5.1 mEq/L    Chloride 104  96 - 112 mEq/L    CO2 19  19 - 32 mEq/L    Glucose, Bld 82  70 - 99 mg/dL    BUN 68 (*) 6 - 23 mg/dL    Creatinine, Ser 6.96 (*) 0.50 - 1.35 mg/dL    Calcium 8.5  8.4 - 29.5 mg/dL    Phosphorus 3.5  2.3 - 4.6 mg/dL    Albumin 2.3 (*) 3.5 - 5.2 g/dL    GFR calc non Af Amer 14 (*) >  90 mL/min    GFR calc Af Amer 16 (*) >90 mL/min     HEENT: EOMI, PERRL Heart: RRR, No M,R,G Abdomen: bs+, non-tender. Edema 2+ on either limb.edema approaching his baseline.  Trach stoma pink granulation tissue.. No distress. Chest remains clear.  Good insight, awareness, memory/ Motor 4/5 in BUE 3 to 3+/5 in B HF,KE, Ankle DF/PF Left calf wound with improvement.  Still some drainage around. allevyn dressings intact  Assessment/Plan: 1. Functional deficits secondary to critical illness myopathy which require 3+ hours per day of interdisciplinary therapy in a comprehensive inpatient rehab setting. Physiatrist is providing close team supervision and 24 hour management of active medical problems listed below. Physiatrist and rehab team continue to assess barriers to discharge/monitor patient progress toward functional and medical goals. FIM: FIM - Bathing Bathing Steps Patient Completed: Right Arm;Left Arm;Chest;Abdomen;Front perineal area;Buttocks;Right upper leg;Left upper leg;Right lower leg (including foot);Left lower leg (including foot) Bathing: 7: Complete Independence: No helper  FIM - Upper Body Dressing/Undressing Upper body dressing/undressing steps patient completed: Thread/unthread right sleeve of pullover shirt/dresss;Thread/unthread left sleeve of pullover shirt/dress;Put head through opening of pull over shirt/dress;Pull shirt over trunk;Thread/unthread right sleeve of front closure shirt/dress Upper body  dressing/undressing: 7: Complete Independence: No helper FIM - Lower Body Dressing/Undressing Lower body dressing/undressing steps patient completed: Thread/unthread right underwear leg;Thread/unthread left underwear leg;Pull underwear up/down;Thread/unthread right pants leg;Don/Doff left sock;Don/Doff right shoe;Fasten/unfasten pants;Don/Doff right sock;Pull pants up/down;Thread/unthread left pants leg;Don/Doff left shoe Lower body dressing/undressing: 6: More than reasonable amount of time  FIM - Toileting Toileting steps completed by patient: Adjust clothing prior to toileting;Performs perineal hygiene;Adjust clothing after toileting Toileting Assistive Devices: Grab bar or rail for support Toileting: 6: Assistive device: No helper  FIM - Diplomatic Services operational officer Devices: Elevated toilet seat Toilet Transfers: 6-More than reasonable amt of time  FIM - Banker Devices: Bed rails Bed/Chair Transfer: 6: Bed > Chair or W/C: No assist;6: Chair or W/C > Bed: No assist  FIM - Locomotion: Wheelchair Distance: 20' Locomotion: Wheelchair: 0: Activity did not occur FIM - Locomotion: Ambulation Locomotion: Ambulation Assistive Devices: Designer, industrial/product Ambulation/Gait Assistance: 5: Supervision Locomotion: Ambulation: 6: Travels 150 ft or more with assistive device/no helper  Comprehension Comprehension Mode: Auditory Comprehension: 7-Follows complex conversation/direction: With no assist  Expression Expression Mode: Verbal Expression Assistive Devices: 6-Communication board Expression: 7-Expresses complex ideas: With no assist  Social Interaction Social Interaction: 7-Interacts appropriately with others - No medications needed.  Problem Solving Problem Solving: 7-Solves complex problems: Recognizes & self-corrects  Memory Memory: 7-Complete Independence: No helper  Medical Problem List and Plan:  1. DVT  Prophylaxis/Anticoagulation: Mechanical: Sequential compression devices, below knee Bilateral lower extremities-ambulating 2. Pain Management: Continue hydrocodone prn  3. Mood: Monitor for now. Seems very motivated.  4. Renal transplant with chronic allograft nephropathy: continue HD on prn basis per nephrology. . Strict I and O. Continue prednisone and sirolimus. Po lasix initiated. Making more urine. Cr around 4.4 5. Left calf ulcer- continue collagegase with allevyn, gauze. 6. Afib: Continue amiodarone. ?coumadin per cardiology-determine plan before dc 7. Anemia of chronic disease:  Continue aranesp weekly. 8. Pulmonary- trach out and no issues whatsoever. Can move to bandaid at home 9. Hypokalemia:  supplemented.   LOS (Days) 18 A FACE TO FACE EVALUATION WAS PERFORMED  Kyliah Deanda T 07/15/2011, 8:03 AM

## 2011-07-15 NOTE — Progress Notes (Signed)
Physical Therapy Session Note  Patient Details  Name: Chad [name] MRN: [id number] Date of Birth: [birth year]  Today's Date: 07/15/2011 Time:  -     Short Term Goals: See d/c goals  Skilled Therapeutic Interventions/Progress Updates:    Reeval;- discussed progress and goals gait training- no increased LOB with conversation or stating alphabet while skipping every other letter (divided attention);  Patient current velocity is 26 inch st 2..34 ft/sec (neighborhood ambulator), 5 steps with rail(s) mod I- increased time and pt choses step to pattern  There-ex for noted hip flexor weakness- raising feet to 6 in n. Step 20 x 2, one set with 2 # weight on RLE, seated rest break required, standning toe raises for plantar flexor strength 20 x 2, standing heel-toe to promote ankle strategy   Therapy Documentation Precautions:  Precautions Precautions: Fall Restrictions Weight Bearing Restrictions: No Pain: Pain Assessment Pain Assessment: No/denies pain Mobility:mod I with rollator   Locomotion : Gait Gait: Yes Gait Pattern:  (foot slap L) Gait velocity: 2.34 ft/sec- neighborhood ambulator   See FIM for current functional status  Therapy/Group: Individual Therapy  Michaelene Song 07/15/2011, 8:36 AM

## 2011-07-15 NOTE — Discharge Instructions (Signed)
Inpatient Rehab Discharge Instructions  [facility] Discharge date and time: No discharge date for patient encounter.   Activities/Precautions/ Functional Status: Activity: activity as tolerated Diet: renal diet Wound Care: none needed Functional status:  ___ No restrictions     ___ Walk up steps independently ___ 24/7 supervision/assistance   _x__ Walk up steps with assistance ___ Intermittent supervision/assistance  ___ Bathe/dress independently ___ Walk with walker     ___ Bathe/dress with assistance ___ Walk Independently    ___ Shower independently __x_ Walk with assistance    __x_ Shower with assistance ___ No alcohol     ___ Return to work/school ________   COMMUNITY REFERRALS UPON DISCHARGE:    Home Health:   PT    OT   RN                       Agency: [facility] Phone: [phone]   Medical Equipment/Items Ordered: Rollator walker                                                      Agency/Supplier: [facility]   GENERAL COMMUNITY RESOURCES FOR PATIENT/FAMILY: Support Groups:***  N/A:***  Employment Assistance: Info provided on SSD if pt so chooses to apply Caregiver Support:***  N/A:*** Education Assistance:***   N/A:*** Mental Health:***   N/A:***  Home Modification:***   N/A:***     Special Instructions:    COMMUNITY REFERRALS UPON DISCHARGE:    Home Health:   PT,OT,RN  Agency:[facility] [phone] Date of last service:07/16/2011  Medical Equipment/Items Ordered:ROLLATOR Dan Humphreys, [facility]  Agency/Supplier:[facility]      My questions have been answered and I understand these instructions. I will adhere to these goals and the provided educational materials after my discharge from the hospital.  Patient/Caregiver Signature _______________________________ Date __________  Clinician Signature _______________________________________ Date __________  Please bring this form and your medication list with you to all your  follow-up doctor's appointments.

## 2011-07-15 NOTE — Progress Notes (Signed)
Occupational Therapy Discharge Summary  Patient Details  Name: Chad [name] MRN: [id number] Date of Birth: Jul 09, 1955  Today's Date: 07/15/2011 Time: 0930-1000 Time Calculation (min): 30 min  Patient has met 11 of 11 long term goals due to improved activity tolerance, improved balance, postural control and functional use of  RIGHT upper, RIGHT lower, LEFT upper and LEFT lower extremity.  Patient to discharge at overall Modified Independent level. Pt can perform basic ADLs and simple meal prep and laundry tasks mod I. Pt plans to use friends and family to assit with other IADLs.  Reasons goals not met: n/a  Recommendation:  Patient will benefit from ongoing skilled OT services in home health setting to continue to advance functional skills in the area of iADL and Vocation.  Equipment: BSC  Reasons for discharge: treatment goals met and discharge from hospital  Patient/family agrees with progress made and goals achieved: Yes  OT Discharge Precautions/Restrictions  Precautions Precautions: None Restrictions Weight Bearing Restrictions: No Pain Pain Assessment Pain Assessment: No/denies pain ADL ADL Eating: Independent Where Assessed-Eating: Wheelchair Grooming: Independent Where Assessed-Grooming: Sitting at sink;Wheelchair Upper Body Bathing: Independent Where Assessed-Upper Body Bathing: Shower Lower Body Bathing: Independent Where Assessed-Lower Body Bathing: Sitting at sink;Wheelchair Upper Body Dressing: Independent Where Assessed-Upper Body Dressing: Wheelchair Lower Body Dressing: Independent Where Assessed-Lower Body Dressing: Standing at sink;Sitting at sink Toileting: Modified independent Toilet Transfer: Modified independent Toilet Transfer Method: Surveyor, minerals: Gaffer: Modified independent Vision/Perception  Vision - History Baseline Vision: No visual deficits Patient Visual Report: No change from  baseline Vision - Assessment Eye Alignment: Within Functional Limits Perception Perception: Within Functional Limits Praxis Praxis: Intact  Cognition Overall Cognitive Status: Appears within functional limits for tasks assessed Arousal/Alertness: Awake/alert Orientation Level: Oriented X4 Alternating Attention: Appears intact Divided Attention: Appears intact Memory: Appears intact Awareness: Appears intact Problem Solving: Appears intact Reasoning: Appears intact Sequencing: Appears intact Organizing: Appears intact Decision Making: Appears intact Initiating: Appears intact Self Monitoring: Appears intact Self Correcting: Appears intact Safety/Judgment: Appears intact Sensation Sensation Light Touch: Appears Intact Stereognosis: Appears Intact Hot/Cold: Appears Intact Proprioception: Appears Intact Coordination Gross Motor Movements are Fluid and Coordinated: Yes Fine Motor Movements are Fluid and Coordinated: Yes Motor  Motor Motor: Within Functional Limits Mobility  Bed Mobility Supine to Sit: 7: Independent Sitting - Scoot to Edge of Bed: 7: Independent Sit to Supine: 7: Independent Transfers Sit to Stand: 6: Modified independent (Device/Increase time)  Trunk/Postural Assessment  Cervical Assessment Cervical Assessment: Within Functional Limits Thoracic Assessment Thoracic Assessment: Within Functional Limits Lumbar Assessment Lumbar Assessment: Within Functional Limits Postural Control Postural Control: Within Functional Limits  Balance Balance Balance Assessed: Yes Static Sitting Balance Static Sitting - Level of Assistance: 7: Independent Dynamic Sitting Balance Dynamic Sitting - Level of Assistance: 7: Independent Static Standing Balance Static Standing - Level of Assistance: 7: Independent Dynamic Standing Balance Dynamic Standing - Level of Assistance: 6: Modified independent (Device/Increase time) Extremity/Trunk Assessment RUE  Assessment RUE Assessment: Within Functional Limits LUE Assessment LUE Assessment: Within Functional Limits  See FIM for current functional status  Roney Mans [facility] 07/15/2011, 2:42 PM

## 2011-07-15 NOTE — Progress Notes (Signed)
Recreational Therapy Session Note  Patient Details  Name: ANDERSSON LARRABEE MRN: [id number] Date of Birth: 1955-03-31 Today's Date: 07/15/2011 Time: 10-12 Pain: no c/o  Skilled Therapeutic Interventions/Progress Updates: Pt participated in community reintegration/outing to [employer] ambulatory level using rollator with  Modified Independence.  Pt demonstrated good safety awareness, was able to identify energy conservation techniques, and negotiate obstacles.  Pt could not use steps to get on the van and therefore used lift.  (step height was 13 inches)  Pt did however go down the steps using vertical grab bar x2.  Pt did require Min assist for sit-->stand from extremely low surface.  See outing goal sheet in shadow chart for further details.  Therapy/Group: [employer]  Romone Shaff 07/15/2011, 2:57 PM

## 2011-07-16 DIAGNOSIS — Z5189 Encounter for other specified aftercare: Secondary | ICD-10-CM

## 2011-07-16 DIAGNOSIS — G7281 Critical illness myopathy: Secondary | ICD-10-CM

## 2011-07-16 LAB — RENAL FUNCTION PANEL
Albumin: 2.3 g/dL — ABNORMAL LOW (ref 3.5–5.2)
BUN: 69 mg/dL — ABNORMAL HIGH (ref 6–23)
Creatinine, Ser: 4.49 mg/dL — ABNORMAL HIGH (ref 0.50–1.35)
Glucose, Bld: 79 mg/dL (ref 70–99)
Phosphorus: 3.7 mg/dL (ref 2.3–4.6)
Potassium: 4 mEq/L (ref 3.5–5.1)

## 2011-07-16 MED ORDER — FUROSEMIDE 40 MG PO TABS: 40.0000 mg | ORAL_TABLET | Freq: Two times a day (BID) | ORAL | Status: DC

## 2011-07-16 MED ORDER — POTASSIUM CHLORIDE CRYS ER 10 MEQ PO TBCR: 10.0000 meq | EXTENDED_RELEASE_TABLET | Freq: Every day | ORAL | Status: DC

## 2011-07-16 MED ORDER — SIROLIMUS 1 MG/ML PO SOLN: 1.5000 mg | Freq: Every day | ORAL | Status: DC

## 2011-07-16 MED ORDER — CALCITRIOL 0.25 MCG PO CAPS: 0.2500 ug | ORAL_CAPSULE | Freq: Every day | ORAL | Status: DC

## 2011-07-16 MED ORDER — METHOCARBAMOL 500 MG PO TABS: 500.0000 mg | ORAL_TABLET | Freq: Four times a day (QID) | ORAL | Status: AC | PRN

## 2011-07-16 MED ORDER — AMIODARONE HCL 200 MG PO TABS: 200.0000 mg | ORAL_TABLET | Freq: Every day | ORAL | Status: DC

## 2011-07-16 MED ORDER — CARVEDILOL 3.125 MG PO TABS: 3.1250 mg | ORAL_TABLET | Freq: Two times a day (BID) | ORAL | Status: DC

## 2011-07-16 MED ORDER — PANTOPRAZOLE SODIUM 40 MG PO TBEC: 40.0000 mg | DELAYED_RELEASE_TABLET | Freq: Two times a day (BID) | ORAL | Status: DC

## 2011-07-16 MED ORDER — HYDROCODONE-ACETAMINOPHEN 5-325 MG PO TABS: 1.0000 | ORAL_TABLET | ORAL | Status: AC | PRN

## 2011-07-16 NOTE — Progress Notes (Signed)
Pt discharged home with friend, Dan Finland PA, provided discharge instructions and prescriptions, Pt verbalized an understanding and denied any questions or concerns, Home medication [place] sent home with patient

## 2011-07-16 NOTE — Progress Notes (Signed)
Social Work Discharge Note Discharge Note  The overall goal for the admission was met for:   Discharge location: Yes-HOME WITH INTERMITTENT ASSIST  Length of Stay: Yes-19 DAYS  Discharge activity level: Yes  Home/community participation: Yes  Services provided included: MD, RD, PT, OT, RN, CM, TR, Pharmacy and SW  Financial Services: Private Insurance: [employer]-OUT OF STATE  Follow-up services arranged: Home Health: [facility]-PT,OT,RN, DME: [facility]-ROLLATOR WALKER, BSC and Patient/Family has no preference for HH/DME agencies  Comments (or additional information):  Patient/Family verbalized understanding of follow-up arrangements: Yes  Individual responsible for coordination of the follow-up plan: SELF  Confirmed correct DME delivered: Lucy Chris 07/16/2011    Lashawn Orrego, Lemar Livings

## 2011-07-16 NOTE — Progress Notes (Signed)
Patient ID: GATES JIVIDEN, male   DOB: 1955/05/21, 56 y.o.   MRN: [id number] Patient ID: DIAMONTE STAVELY, male   DOB: 09-11-1955, 56 y.o.   MRN: [id number] Subjective/Complaints: No complaints of SOB or CP. Excited about going home. Motivated.  Review of Systems  Respiratory: Negative for cough and shortness of breath.   Cardiovascular: Positive for leg swelling.       Chronic dorsum of L foot  Gastrointestinal: Negative.   Genitourinary:       Good output  Neurological: Positive for focal weakness and weakness.       Both legs weak  All other systems reviewed and are negative.    Objective: Vital Signs: Blood pressure 146/82, pulse 84, temperature 98.2 F (36.8 C), temperature source Oral, resp. rate 19, height 6' (1.829 m), weight 123.3 kg (271 lb 13.2 oz), SpO2 99.00%. No results found. Results for orders placed during the hospital encounter of 06/27/11 (from the past 72 hour(s))  RENAL FUNCTION PANEL     Status: Abnormal   Collection Time   07/14/11  6:00 AM      Component Value Range Comment   Sodium 138  135 - 145 mEq/L    Potassium 4.0  3.5 - 5.1 mEq/L    Chloride 104  96 - 112 mEq/L    CO2 20  19 - 32 mEq/L    Glucose, Bld 79  70 - 99 mg/dL    BUN 73 (*) 6 - 23 mg/dL    Creatinine, Ser 7.82 (*) 0.50 - 1.35 mg/dL    Calcium 8.6  8.4 - 95.6 mg/dL    Phosphorus 3.8  2.3 - 4.6 mg/dL    Albumin 2.3 (*) 3.5 - 5.2 g/dL    GFR calc non Af Amer 14 (*) >90 mL/min    GFR calc Af Amer 16 (*) >90 mL/min   RENAL FUNCTION PANEL     Status: Abnormal   Collection Time   07/15/11  5:00 AM      Component Value Range Comment   Sodium 136  135 - 145 mEq/L    Potassium 4.1  3.5 - 5.1 mEq/L    Chloride 104  96 - 112 mEq/L    CO2 19  19 - 32 mEq/L    Glucose, Bld 82  70 - 99 mg/dL    BUN 68 (*) 6 - 23 mg/dL    Creatinine, Ser 2.13 (*) 0.50 - 1.35 mg/dL    Calcium 8.5  8.4 - 08.6 mg/dL    Phosphorus 3.5  2.3 - 4.6 mg/dL    Albumin 2.3 (*) 3.5 - 5.2 g/dL    GFR calc non Af Amer 14 (*)  >90 mL/min    GFR calc Af Amer 16 (*) >90 mL/min     HEENT: EOMI, PERRL Heart: RRR, No M,R,G Abdomen: bs+, non-tender. Edema 2+ on either limb.edema approaching his baseline.  Trach stoma pink granulation tissue.. No distress. Chest remains clear.  Good insight, awareness, memory/ Motor 4/5 in BUE 3 to 3+/5 in B HF,KE, Ankle DF/PF Left calf wound with improvement.  Still some drainage around. allevyn dressings intact  Assessment/Plan: 1. Functional deficits secondary to critical illness myopathy which require 3+ hours per day of interdisciplinary therapy in a comprehensive inpatient rehab setting. Physiatrist is providing close team supervision and 24 hour management of active medical problems listed below. Physiatrist and rehab team continue to assess barriers to discharge/monitor patient progress toward functional and medical goals.  DC home today.  FIM: FIM - Bathing Bathing Steps Patient Completed: Right Arm;Left Arm;Chest;Abdomen;Front perineal area;Buttocks;Right upper leg;Left upper leg;Right lower leg (including foot);Left lower leg (including foot) Bathing: 7: Complete Independence: No helper  FIM - Upper Body Dressing/Undressing Upper body dressing/undressing steps patient completed: Thread/unthread right sleeve of pullover shirt/dresss;Thread/unthread left sleeve of pullover shirt/dress;Put head through opening of pull over shirt/dress;Pull shirt over trunk;Thread/unthread right sleeve of front closure shirt/dress Upper body dressing/undressing: 7: Complete Independence: No helper FIM - Lower Body Dressing/Undressing Lower body dressing/undressing steps patient completed: Thread/unthread right underwear leg;Thread/unthread left underwear leg;Pull underwear up/down;Thread/unthread right pants leg;Don/Doff left sock;Don/Doff right shoe;Fasten/unfasten pants;Don/Doff right sock;Pull pants up/down;Thread/unthread left pants leg;Don/Doff left shoe Lower body dressing/undressing: 6:  More than reasonable amount of time  FIM - Toileting Toileting steps completed by patient: Adjust clothing prior to toileting;Performs perineal hygiene;Adjust clothing after toileting Toileting Assistive Devices: Grab bar or rail for support Toileting: 6: Assistive device: No helper  FIM - Diplomatic Services operational officer Devices: Elevated toilet seat Toilet Transfers: 6-Assistive device: No helper  FIM - Banker Devices: Therapist, occupational: 6: Assistive device: no helper  FIM - Locomotion: Wheelchair Distance: 20' Locomotion: Wheelchair: 0: Activity did not occur FIM - Locomotion: Ambulation Locomotion: Ambulation Assistive Devices: Designer, industrial/product Ambulation/Gait Assistance: 6: Modified independent (Device/Increase time) Locomotion: Ambulation: 6: Travels 150 ft or more independently/takes more than reasonable amount of time  Comprehension Comprehension Mode: Auditory Comprehension: 7-Follows complex conversation/direction: With no assist  Expression Expression Mode: Verbal Expression Assistive Devices: 6-Communication board Expression: 7-Expresses complex ideas: With no assist  Social Interaction Social Interaction: 7-Interacts appropriately with others - No medications needed.  Problem Solving Problem Solving: 7-Solves complex problems: Recognizes & self-corrects  Memory Memory: 7-Complete Independence: No helper  Medical Problem List and Plan:  1. DVT Prophylaxis/Anticoagulation: Mechanical: Sequential compression devices, below knee Bilateral lower extremities-ambulating 2. Pain Management: Continue hydrocodone prn  3. Mood: Monitor for now. Seems very motivated.  4. Renal transplant with chronic allograft nephropathy: continue HD on prn basis per nephrology. . Strict I and O. Continue prednisone and sirolimus. Po lasix initiated. Making more urine. Cr around 4.4 5. Left calf ulcer- allevyn daily. Dc  collagenase. May use collagenase prn if debris develops. Gentle scrub to tissue each day to clean. hhrn 6. Afib: Continue amiodarone. Follow up with cardiology regarding coumadin plan as an outpt. 7. Anemia of chronic disease:  Continue aranesp weekly. 8. Pulmonary- trach out and no issues whatsoever. Can move to bandaid at home 9. Hypokalemia:  supplemented.   LOS (Days) 19 A FACE TO FACE EVALUATION WAS PERFORMED  Jassmin Kemmerer T 07/16/2011, 7:14 AM

## 2011-07-17 LAB — AFB CULTURE WITH SMEAR (NOT AT ARMC)

## 2011-07-18 ENCOUNTER — Telehealth: Payer: Self-pay | Admitting: Physical Medicine & Rehabilitation

## 2011-07-18 NOTE — Telephone Encounter (Signed)
Please advise 

## 2011-07-18 NOTE — Telephone Encounter (Signed)
Chad [name] [phone]  - wound on posterior L leg.  Does she continue with Santyl?

## 2011-07-18 NOTE — Telephone Encounter (Signed)
i advised patient to keep the allevyn dressing on the leg without santyl. Most of the fibronecrotic tissue is gone, so it doesn't require santyl any futher.  Would monitor weekly for any changes. (the patient can help with monitoring as well)

## 2011-07-25 NOTE — Discharge Summary (Signed)
[name], [name] NO.:  [id number]  MEDICAL RECORD NO.:  [id number]  LOCATION:  4037                         FACILITY:  [facility]  PHYSICIAN:  Faith Rogue, MD.      DATE OF BIRTH:  July 10, 1955  DATE OF ADMISSION:  06/27/2011 DATE OF DISCHARGE:  07/16/2011                              DISCHARGE SUMMARY   DISCHARGE DIAGNOSES: 1. 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 2. Renal transplant with chronic allograft nephropathy. 3. Atrial fibrillation. 4. Left shin ulcer due to wound. 5. Anemia of chronic disease. 6. Cardiomyopathy, ?etilogy. 7. Hypertension.  HISTORY OF PRESENT ILLNESS:  Chad [name] is a 56 year old male with a history of renal transplant, hep C, admitted on Jun 03, 2011 with cough, dyspnea, and fever.  The patient progress to VDRF requiring intubation on May 8.  BAL was positive for parainfluenza virus and the patient was treated with antifungal and IVIG as well as broad-spectrum antibiotics. He required pressors due to combination of septic and cardiogenic shock. He developed atrial flutter and was noted to have cardiomyopathy with EF of 15-20%, question etiology.  He was started on amiodarone for rate control.  He was noted to have oliguria with lymphedema and was started on CVVHD with improvement.  He required trach on May 20 and had been extubated to trach collar.  He developed fever due to cellulitis of lower extremity and was started on IV vancomycin with recommendations for 7 total days of antibiotic therapy by ID.  His urine output was improving and the patient was being dialyzed on p.r.n. basis for chronic allograft nephropathy.  He was downsized to [facility] CFS #6 on Jun 24, 2001.  He did develop acute toothache due to pulpitis symptoms and a dental extraction was done by Dr. Kristin Bruins on May 31.  Continued on amiodarone for aflutter with question of Coumadin for treatment.  The patient was noted to be  deconditioned with critical illness myopathy. He was evaluated by Rehab team and we felt that he would benefit from a CIR program.  PAST MEDICAL HISTORY:  Positive for hypertension and renal transplant.  FUNCTIONAL HISTORY:  The patient was independent and working prior to admission.  FUNCTIONAL STATUS:  The patient required plus 2 total assist for lower body care, min assist for upper body bathing, noted to be weak and deconditioned.  He was at supervision level for bed mobility, plus 2 total assist 70% for bed to chair transfers.  Gait not tested yet.  RECENT LABS:  Check of lytes from June 19, revealed sodium 138, potassium 4.0, chloride 105, CO2 of 20, BUN 69, creatinine 4.49, glucose 79.  CBC last of June 13 reveals hemoglobin 9.0, hematocrit 28.9, white count 12.1 platelets 151.  Iron studies of June 13 revealed iron at 61, UIBC 111, TIBC 172, iron saturation 35, and serum ferritin at 1284. Recheck of Tacrolimus level on June 14 was therapeutic at 8.7.  HOSPITAL COURSE:  [name] was admitted to Rehab on Jun 09, 2011 for inpatient therapies to consist of PT, OT, and speech therapy at least 3 hours 5 days a week.  Past admission, physiatrist, rehab, RN, and Therapy team have  worked together to provide Animator care.  Rehab RN has worked with the patient on bowel and bladder program as well as wound care monitoring. The patient was noted to have left leg ulcer that was initially treated with Santyl to  chemically degrade as he had yellow adherent eschar. This has resolved and mepilex was continued for protection and for padding. Nephrology has followed along with input on patient's renal status.  He did not require dialysis during the stay.  His IJ catheter was removed on June 17 without complications.  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  Patient's p.o. intake and urine output  has been good.  He has been continent of bowel and bladder. With increase in mobility, patient had recurrent of chronic lower extremity edema and compression with ace wraps as well as Elevation when in seated were initiated. The patient was decannulated without difficulty and was tolerating regular diet at the time of discharge.  During the during patient's stay in rehab, weekly team conferences were held to monitor the patient's progress, set goals, as well as discuss barriers to discharge.  Physical Therapy has worked with the patient on mobility, strengthening, as well as endurance.  The patient progressed to being at independent level for transfers and was independent for ambulating, carpeted as well as outdoor surfaces.  He was at modified independent level for navigating 5 stairs.  The patient continues to have some upper extremity tremors and decreased balance reactions.  He Showed good awareness and insight regarding asking for help for  carrying objects up and down stairs.  OT has worked with the patient on self-care tasks as well as simple home-management tasks.  The patient progressed to being at modified independent level for bathing, dressing, as well as simple meal preparation and laundry tasks.  He was showing improvement in postural control as well as functional use of bilateral upper and lower extremities.  The patient was planning to utilize family and friends to assist with other IADLs.  FURTHER FOLLOWUP:  Home health therapies to continue past discharge.  On July 16, 2011, the patient was discharged to home.  He is to follow up with Cardiology for input regarding need for Coumadin for his cardiac issues.  DISCHARGE MEDICATIONS: 1. Amiodarone 200 mg p.o. per day. 2. Coreg 3.125 mg b.i.d. 3. Lasix 40 mg b.i.d. 4. Norco 5-325, 1-2 p.o. q.i.d. p.r.n. pain. 5. Robaxin 500 mg p.o. q.i.d. p.r.n. spasms. 6. Protonix 40 mg a day. 7. K-Dur 10 mEq a day. 8. Calcitriol  0.25 mcg per day. 9. Sirolimus 1.5 mg per day. 10.Tylenol 1000 mg p.o. q.6 hours p.r.n. pain. 11.Prednisone 10 mg a day. 12.Zocor 40 mg per day.  DIET:  Regular.  ACTIVITIES:  As tolerated with use of walker.  SPECIAL INSTRUCTIONS:  [facility] to provide PT, OT, and RN with home health RN to continue wound care of left shin wound.  Do not use allopurinol, diltiazem, sildenafil,or torsemide.  FOLLOWUP:  The patient is to follow up with Dr. Riley Kill for routine check.  Follow up with Dr. Marina Gravel on July 3 at 2 p.m.     Delle Reining, P.A.   __________________ Faith Rogue, M.D.   PL/MEDQ  D:  07/24/2011  T:  07/25/2011  Job:  [id number]  cc:   Wilber Bihari. Caryn Section, M.D. Marca Ancona, MD

## 2011-08-08 ENCOUNTER — Emergency Department (HOSPITAL_COMMUNITY)
Admission: EM | Admit: 2011-08-08 | Discharge: 2011-08-09 | Disposition: A | Discharge status: Home / Self Care | Payer: BC Managed Care – PPO | Attending: Emergency Medicine | Admitting: Emergency Medicine

## 2011-08-08 ENCOUNTER — Encounter (HOSPITAL_COMMUNITY): Payer: Self-pay | Admitting: *Deleted

## 2011-08-08 DIAGNOSIS — R141 Gas pain: Secondary | ICD-10-CM | POA: Insufficient documentation

## 2011-08-08 DIAGNOSIS — Z79899 Other long term (current) drug therapy: Secondary | ICD-10-CM | POA: Insufficient documentation

## 2011-08-08 DIAGNOSIS — R142 Eructation: Secondary | ICD-10-CM | POA: Insufficient documentation

## 2011-08-08 DIAGNOSIS — N189 Chronic kidney disease, unspecified: Secondary | ICD-10-CM | POA: Insufficient documentation

## 2011-08-08 DIAGNOSIS — R110 Nausea: Secondary | ICD-10-CM | POA: Insufficient documentation

## 2011-08-08 DIAGNOSIS — I129 Hypertensive chronic kidney disease with stage 1 through stage 4 chronic kidney disease, or unspecified chronic kidney disease: Secondary | ICD-10-CM | POA: Insufficient documentation

## 2011-08-08 DIAGNOSIS — R1013 Epigastric pain: Secondary | ICD-10-CM | POA: Insufficient documentation

## 2011-08-08 DIAGNOSIS — R109 Unspecified abdominal pain: Secondary | ICD-10-CM

## 2011-08-08 LAB — DIFFERENTIAL
Basophils Absolute: 0.1 10*3/uL (ref 0.0–0.1)
Basophils Relative: 1 % (ref 0–1)
Lymphocytes Relative: 19 % (ref 12–46)
Neutro Abs: 5.5 10*3/uL (ref 1.7–7.7)
Neutrophils Relative %: 50 % (ref 43–77)

## 2011-08-08 LAB — POCT I-STAT, CHEM 8
BUN: 58 mg/dL — ABNORMAL HIGH (ref 6–23)
Calcium, Ion: 1.15 mmol/L (ref 1.12–1.23)
Chloride: 103 mEq/L (ref 96–112)
Creatinine, Ser: 4.7 mg/dL — ABNORMAL HIGH (ref 0.50–1.35)

## 2011-08-08 LAB — BASIC METABOLIC PANEL
Chloride: 96 mEq/L (ref 96–112)
Creatinine, Ser: 4.76 mg/dL — ABNORMAL HIGH (ref 0.50–1.35)
GFR calc Af Amer: 15 mL/min — ABNORMAL LOW (ref >90)
Potassium: 4.4 mEq/L (ref 3.5–5.1)
Sodium: 135 mEq/L (ref 135–145)

## 2011-08-08 LAB — HEPATIC FUNCTION PANEL
ALT: <5 U/L (ref 0–53)
AST: 19 U/L (ref 0–37)
Albumin: 3.3 g/dL — ABNORMAL LOW (ref 3.5–5.2)
Indirect Bilirubin: 0.3 mg/dL (ref 0.3–0.9)
Total Protein: 8.9 g/dL — ABNORMAL HIGH (ref 6.0–8.3)

## 2011-08-08 LAB — URINALYSIS, ROUTINE W REFLEX MICROSCOPIC
Ketones, ur: NEGATIVE mg/dL
Leukocytes, UA: NEGATIVE
Nitrite: NEGATIVE
Protein, ur: NEGATIVE mg/dL

## 2011-08-08 LAB — CBC
MCHC: 33.3 g/dL (ref 30.0–36.0)
Platelets: 255 10*3/uL (ref 150–400)
RDW: 16.6 % — ABNORMAL HIGH (ref 11.5–15.5)
WBC: 11 10*3/uL — ABNORMAL HIGH (ref 4.0–10.5)

## 2011-08-08 NOTE — ED Notes (Signed)
Patient updated on status.  Vitals rechecked .  Patient continues to have pain.  Apology for wait.  Blanket given previously

## 2011-08-08 NOTE — ED Notes (Signed)
Pt states on and off again abdominal pain for the past week. Pt states stomach feels bloated. Pt denies problems with bowel movements or urination. Pt states mid abdomen around umbilicus.

## 2011-08-09 ENCOUNTER — Emergency Department (HOSPITAL_COMMUNITY): Payer: BC Managed Care – PPO

## 2011-08-09 LAB — LACTIC ACID, PLASMA: Lactic Acid, Venous: 1.4 mmol/L (ref 0.5–2.2)

## 2011-08-09 MED ORDER — FAMOTIDINE 20 MG PO TABS
20.0000 mg | ORAL_TABLET | Freq: Once | ORAL | Status: AC
  Administered 2011-08-09: 20 mg via ORAL
  Filled 2011-08-09: qty 1

## 2011-08-09 MED ORDER — SODIUM CHLORIDE 0.9 % IV BOLUS (SEPSIS)
1000.0000 mL | Freq: Once | INTRAVENOUS | Status: AC
  Administered 2011-08-09: 1000 mL via INTRAVENOUS

## 2011-08-09 MED ORDER — ONDANSETRON HCL 4 MG/2ML IJ SOLN
4.0000 mg | Freq: Once | INTRAMUSCULAR | Status: AC
  Administered 2011-08-09: 4 mg via INTRAVENOUS
  Filled 2011-08-09: qty 2

## 2011-08-09 MED ORDER — FAMOTIDINE 20 MG PO TABS: 20.0000 mg | ORAL_TABLET | Freq: Two times a day (BID) | ORAL | Status: DC

## 2011-08-09 MED ORDER — OXYCODONE-ACETAMINOPHEN 5-325 MG PO TABS
1.0000 | ORAL_TABLET | Freq: Four times a day (QID) | ORAL | Status: AC | PRN
Start: 2011-08-09 — End: 2011-08-19

## 2011-08-09 MED ORDER — GI COCKTAIL ~~LOC~~
30.0000 mL | Freq: Once | ORAL | Status: AC
  Administered 2011-08-09: 30 mL via ORAL
  Filled 2011-08-09: qty 30

## 2011-08-09 MED ORDER — FENTANYL CITRATE 0.05 MG/ML IJ SOLN
50.0000 ug | Freq: Once | INTRAMUSCULAR | Status: AC
  Administered 2011-08-09: 50 ug via INTRAVENOUS
  Filled 2011-08-09: qty 2

## 2011-08-09 MED ORDER — PANTOPRAZOLE SODIUM 40 MG IV SOLR
40.0000 mg | Freq: Once | INTRAVENOUS | Status: AC
  Administered 2011-08-09: 40 mg via INTRAVENOUS
  Filled 2011-08-09: qty 40

## 2011-08-09 NOTE — ED Provider Notes (Signed)
History     CSN: [id number]  Arrival date & time 08/08/11  1858   First MD Initiated Contact with Patient 08/09/11 0009      Chief Complaint  Patient presents with  . Abdominal Pain    (Consider location/radiation/quality/duration/timing/severity/associated sxs/prior treatment) HPI History provided by patient. Dialysis patient with epigastric pain worse throughout the day. No known aggravating or alleviating factors. Denies history of same. Taking prescribed medications at home including Protonix. No chest pain or difficulty breathing. No fevers or chills. Some nausea and feels bloated but no vomiting or diarrhea. Normal bowel movement yesterday. No blood in stools. In any mild discomfort on off last week but persistent today. Past Medical History  Diagnosis Date  . Hypertension   . H/O kidney transplant   . Hepatitis   . Chronic kidney disease     Past Surgical History  Procedure Date  . Nephrectomy transplanted organ   . Insertion of dialysis catheter 06/20/2011    Procedure: INSERTION OF DIALYSIS CATHETER;  Surgeon: [name], MD;  Location: [facility] OR;  Service: Vascular;  Laterality: Right;  Ultrasound guided insertion of right internal jugular dialysis catheter  . Multiple extractions with alveoloplasty 06/27/2011    Procedure: MULTIPLE EXTRACION WITH ALVEOLOPLASTY;  Surgeon: [name], DDS;  Location: [facility] OR;  Service: Oral Surgery;  Laterality: N/A;  Extraction  of tooth # 14 with alveoloplasty    History reviewed. No pertinent family history.  History  Substance Use Topics  . Smoking status: Never Smoker   . Smokeless tobacco: Not on file  . Alcohol Use: No      Review of Systems  Constitutional: Negative for fever and chills.  HENT: Negative for neck pain and neck stiffness.   Eyes: Negative for pain.  Respiratory: Negative for shortness of breath.   Cardiovascular: Negative for chest pain.  Gastrointestinal: Positive for abdominal pain. Negative for  constipation, blood in stool and abdominal distention.  Genitourinary: Negative for dysuria.  Musculoskeletal: Negative for back pain.  Skin: Negative for rash.  Neurological: Negative for headaches.  All other systems reviewed and are negative.    Allergies  Review of patient's allergies indicates no known allergies.  Home Medications   Current Outpatient Rx  Name Route Sig Dispense Refill  . AMIODARONE HCL 200 MG PO TABS Oral Take 200 mg by mouth daily.    Marland Kitchen CALCITRIOL 0.25 MCG PO CAPS Oral Take 0.25 mcg by mouth daily.    Marland Kitchen CARVEDILOL 3.125 MG PO TABS Oral Take 3.125 mg by mouth 2 (two) times daily with a meal.    . FUROSEMIDE 40 MG PO TABS Oral Take 40 mg by mouth 2 (two) times daily.    Marland Kitchen HYDROCODONE-ACETAMINOPHEN 5-325 MG PO TABS Oral Take 1-2 tablets by mouth every 4 (four) hours as needed. For pain    . PANTOPRAZOLE SODIUM 40 MG PO TBEC Oral Take 40 mg by mouth 2 (two) times daily before a meal.    . POTASSIUM CHLORIDE CRYS ER 10 MEQ PO TBCR Oral Take 10 mEq by mouth daily.    Marland Kitchen SIMVASTATIN 40 MG PO TABS Oral Take 40 mg by mouth every evening.    Marland Kitchen SIROLIMUS 1 MG/ML PO SOLN Oral Take 1.5 mg by mouth daily.      BP 122/76  Pulse 87  Temp 97.5 F (36.4 C) (Oral)  Resp 16  SpO2 97%  Physical Exam  Constitutional: He is oriented to person, place, and time. He appears well-developed and  well-nourished.  HENT:  Head: Normocephalic and atraumatic.  Eyes: Conjunctivae and EOM are normal. Pupils are equal, round, and reactive to light.  Neck: Trachea normal and full passive range of motion without pain. Neck supple. No thyromegaly present.       No meningismus  Cardiovascular: Normal rate, regular rhythm, S1 normal, S2 normal, intact distal pulses and normal pulses.     No systolic murmur is present   No diastolic murmur is present  Pulses:      Radial pulses are 2+ on the right side, and 2+ on the left side.  Pulmonary/Chest: Effort normal and breath sounds normal. He  has no wheezes. He has no rhonchi. He has no rales. He exhibits no tenderness.  Abdominal: Soft. Normal appearance and bowel sounds are normal. There is no CVA tenderness and negative Murphy's sign.       Tender epigastrium no right upper quadrant tenderness and a lower ABD tenderness.   Musculoskeletal: Normal range of motion.       BLE:s Calves nontender, no cords or erythema, negative Homans sign  Neurological: He is alert and oriented to person, place, and time. He has normal strength and normal reflexes. No cranial nerve deficit or sensory deficit. He displays a negative Romberg sign. GCS eye subscore is 4. GCS verbal subscore is 5. GCS motor subscore is 6.       Normal Gait  Skin: Skin is warm and dry. No rash noted. He is not diaphoretic. No cyanosis. Nails show no clubbing.  Psychiatric: He has a normal mood and affect. His speech is normal and behavior is normal.       Cooperative and appropriate    ED Course  Procedures (including critical care time)  Labs Reviewed  CBC - Abnormal; Notable for the following:    WBC 11.0 (*)     RDW 16.6 (*)     All other components within normal limits  DIFFERENTIAL - Abnormal; Notable for the following:    Monocytes Relative 18 (*)     Monocytes Absolute 1.9 (*)     Eosinophils Relative 13 (*)     Eosinophils Absolute 1.4 (*)     All other components within normal limits  BASIC METABOLIC PANEL - Abnormal; Notable for the following:    Glucose, Bld 103 (*)     BUN 58 (*)     Creatinine, Ser 4.76 (*)     GFR calc non Af Amer 13 (*)     GFR calc Af Amer 15 (*)     All other components within normal limits  HEPATIC FUNCTION PANEL - Abnormal; Notable for the following:    Total Protein 8.9 (*)     Albumin 3.3 (*)     All other components within normal limits  POCT I-STAT, CHEM 8 - Abnormal; Notable for the following:    BUN 58 (*)     Creatinine, Ser 4.70 (*)     Glucose, Bld 101 (*)     All other components within normal limits    URINALYSIS, ROUTINE W REFLEX MICROSCOPIC  LACTIC ACID, PLASMA   Ct Abdomen Pelvis Wo Contrast  08/09/2011  *RADIOLOGY REPORT*  Clinical Data: Abdominal pain  CT ABDOMEN AND PELVIS WITHOUT CONTRAST  Technique:  Multidetector CT imaging of the abdomen and pelvis was performed following the standard protocol without intravenous contrast.  Comparison: 03/30/2009  Findings: Cardiomegaly.  Coronary artery calcification.  Aortic valve calcification.  Small pericardial effusion.  Organ abnormality/lesion detection is  limited in the absence of intravenous contrast. Within this limitation, unremarkable liver, biliary system, spleen, pancreas, adrenal glands.  Atrophic native kidneys with cortical thinning and nonspecific calcifications.  Right lower quadrant renal transplant the previously described cystic lesion has decreased in size. Two lesions arising from the upper pole, one of which is hyperdense, the other which is hypodense, increased in size in the interval.  Hypodense cyst arising from the lower pole right kidney is incompletely characterized however similar to prior.  No hydronephrosis or hydroureter.  No bowel obstruction.  Colonic diverticulosis.  No free intraperitoneal air or fluid.  No lymphadenopathy.  There is scattered atherosclerotic calcification of the aorta and its branches. No aneurysmal dilatation.  Retroaortic left renal vein.  Thin-walled bladder.  Scarring along the anterior right subcutaneous fat.  Atrophy of the right rectus abdominus musculature.  Left femoral hardware/ hip arthroplasty results in streak artifact. Allowing for this, no definite acute osseous abnormality identified.  Multilevel degenerative changes.  Osteopenia. Multilevel vertebral body height loss.  IMPRESSION: Within limitations of a noncontrast examination, no definite acute abnormality.  Transplant kidney right lower quadrant.  No hydronephrosis. Multiple renal lesions are incompletely characterized.  Recommend non  emergent ultrasound follow-up.  Original Report Authenticated By: Waneta Martins, M.D.    ABD pain improved with GI cocktail, fentanyl. On recheck patient feels GI cocktail relieved his symptoms. Asymptomatic with exam improved. No longer tender and PT requesting to be discharged home. CT results shared with patient and he agrees to followup with his nephrologist for outpatient ultrasound.  6:49 AM feels well and is requesting to be discharged home.  MDM   Epigastric pain improved with GI cocktail and no acute pathology identified on imaging as above, given improved condition, feel PT is appropriate for d/c home and f/u with his physician. No indication for further workup at this time. Nursing notes reviewed. Vital signs within normal limits. Old records reviewed.        Sunnie Nielsen, MD 08/09/11 [phone]

## 2011-08-26 ENCOUNTER — Encounter (HOSPITAL_COMMUNITY): Payer: Self-pay | Admitting: Emergency Medicine

## 2011-08-26 ENCOUNTER — Emergency Department (HOSPITAL_COMMUNITY)
Admission: EM | Admit: 2011-08-26 | Discharge: 2011-08-26 | Disposition: A | Discharge status: Nursing Home (Includes ICF) | Payer: BC Managed Care – PPO | Source: Home / Self Care | Attending: Emergency Medicine | Admitting: Emergency Medicine

## 2011-08-26 ENCOUNTER — Encounter (HOSPITAL_COMMUNITY): Payer: Self-pay

## 2011-08-26 DIAGNOSIS — L97209 Non-pressure chronic ulcer of unspecified calf with unspecified severity: Secondary | ICD-10-CM

## 2011-08-26 DIAGNOSIS — L02419 Cutaneous abscess of limb, unspecified: Secondary | ICD-10-CM | POA: Diagnosis present

## 2011-08-26 DIAGNOSIS — L97809 Non-pressure chronic ulcer of other part of unspecified lower leg with unspecified severity: Secondary | ICD-10-CM | POA: Diagnosis present

## 2011-08-26 DIAGNOSIS — L03119 Cellulitis of unspecified part of limb: Secondary | ICD-10-CM | POA: Diagnosis present

## 2011-08-26 DIAGNOSIS — I4892 Unspecified atrial flutter: Secondary | ICD-10-CM | POA: Diagnosis present

## 2011-08-26 DIAGNOSIS — H1030 Unspecified acute conjunctivitis, unspecified eye: Secondary | ICD-10-CM | POA: Diagnosis present

## 2011-08-26 DIAGNOSIS — Z940 Kidney transplant status: Secondary | ICD-10-CM

## 2011-08-26 DIAGNOSIS — I872 Venous insufficiency (chronic) (peripheral): Principal | ICD-10-CM | POA: Diagnosis present

## 2011-08-26 DIAGNOSIS — B1920 Unspecified viral hepatitis C without hepatic coma: Secondary | ICD-10-CM | POA: Diagnosis present

## 2011-08-26 DIAGNOSIS — I129 Hypertensive chronic kidney disease with stage 1 through stage 4 chronic kidney disease, or unspecified chronic kidney disease: Secondary | ICD-10-CM | POA: Diagnosis present

## 2011-08-26 DIAGNOSIS — I428 Other cardiomyopathies: Secondary | ICD-10-CM | POA: Diagnosis present

## 2011-08-26 DIAGNOSIS — B964 Proteus (mirabilis) (morganii) as the cause of diseases classified elsewhere: Secondary | ICD-10-CM | POA: Diagnosis present

## 2011-08-26 DIAGNOSIS — N039 Chronic nephritic syndrome with unspecified morphologic changes: Secondary | ICD-10-CM | POA: Diagnosis present

## 2011-08-26 DIAGNOSIS — N189 Chronic kidney disease, unspecified: Secondary | ICD-10-CM | POA: Diagnosis present

## 2011-08-26 DIAGNOSIS — Z79899 Other long term (current) drug therapy: Secondary | ICD-10-CM

## 2011-08-26 DIAGNOSIS — B9689 Other specified bacterial agents as the cause of diseases classified elsewhere: Secondary | ICD-10-CM | POA: Diagnosis present

## 2011-08-26 HISTORY — DX: Pneumonia, unspecified organism: J18.9

## 2011-08-26 LAB — CBC WITH DIFFERENTIAL/PLATELET
Basophils Relative: 0 % (ref 0–1)
HCT: 37.3 % — ABNORMAL LOW (ref 39.0–52.0)
Hemoglobin: 12.2 g/dL — ABNORMAL LOW (ref 13.0–17.0)
Lymphocytes Relative: 20 % (ref 12–46)
MCHC: 32.7 g/dL (ref 30.0–36.0)
Monocytes Relative: 11 % (ref 3–12)
Neutro Abs: 7.4 10*3/uL (ref 1.7–7.7)

## 2011-08-26 LAB — BASIC METABOLIC PANEL
BUN: 47 mg/dL — ABNORMAL HIGH (ref 6–23)
Calcium: 9.5 mg/dL (ref 8.4–10.5)
Chloride: 94 mEq/L — ABNORMAL LOW (ref 96–112)
Glucose, Bld: 102 mg/dL — ABNORMAL HIGH (ref 70–99)
Potassium: 3.9 mEq/L (ref 3.5–5.1)

## 2011-08-26 NOTE — ED Notes (Signed)
Pt has ongoing open wound to post aspect of lt lower leg.  States rehab and then home care nurses were coming to change dressing up until 2 weeks ago.  States about 4 days ago he began to notice that the drainage has an odor.  States there is no change in the wound or the drainage.  Called Dr Caryn Section this am regarding odor and they told him to go to an Urgent Care

## 2011-08-26 NOTE — ED Provider Notes (Signed)
History     CSN: [id number]  Arrival date & time 08/26/11  1745   First MD Initiated Contact with Patient 08/26/11 1802      Chief Complaint  Patient presents with  . Wound Infection    (Consider location/radiation/quality/duration/timing/severity/associated sxs/prior treatment) HPI Comments: Patient presents to urgent care this evening complaining of an open wound to the posterior aspect of his left lower leg. It had been increasing in tenderness redness and a odorous drainage coming out of the ulceration. Patient described that he was recently discharged from home care and he have not noticed the wound on his leg as he can't see very well this area. He was being taken care by a home nurse. He called Dr. Caryn Section office this morning and this afternoon receive a call back that he needed to be seen or examined at an urgent care. Which he proceeded to do this evening.  Patient describes that the area is very tender at touch especially in the superior aspect of where the wound is. He described that he had an Ace wrap around that area that he feels was too tight and initially caused him to have a blister that later develop into this wound. He had another wound more proximal to his lower leg that has been healing well. Patient denies any fevers or chills or body aches.  The history is provided by the patient.    Past Medical History  Diagnosis Date  . Hypertension   . H/O kidney transplant   . Hepatitis   . Chronic kidney disease   . Pneumonia     Past Surgical History  Procedure Date  . Nephrectomy transplanted organ   . Insertion of dialysis catheter 06/20/2011    Procedure: INSERTION OF DIALYSIS CATHETER;  Surgeon: [name], MD;  Location: [facility] OR;  Service: Vascular;  Laterality: Right;  Ultrasound guided insertion of right internal jugular dialysis catheter  . Multiple extractions with alveoloplasty 06/27/2011    Procedure: MULTIPLE EXTRACION WITH ALVEOLOPLASTY;  Surgeon: [name], DDS;  Location: [facility] OR;  Service: Oral Surgery;  Laterality: N/A;  Extraction  of tooth # 14 with alveoloplasty  . Hip surgery     No family history on file.  History  Substance Use Topics  . Smoking status: Never Smoker   . Smokeless tobacco: Not on file  . Alcohol Use: No      Review of Systems  Constitutional: Negative for fever and chills.  Skin: Positive for color change and wound.    Allergies  Review of patient's allergies indicates no known allergies.  Home Medications   Current Outpatient Rx  Name Route Sig Dispense Refill  . AMIODARONE HCL 200 MG PO TABS Oral Take 200 mg by mouth daily.    Marland Kitchen CALCITRIOL 0.25 MCG PO CAPS Oral Take 0.25 mcg by mouth daily.    Marland Kitchen CARVEDILOL 3.125 MG PO TABS Oral Take 3.125 mg by mouth 2 (two) times daily with a meal.    . FAMOTIDINE 20 MG PO TABS Oral Take 1 tablet (20 mg total) by mouth 2 (two) times daily. 30 tablet 0  . FUROSEMIDE 40 MG PO TABS Oral Take 40 mg by mouth 2 (two) times daily.    Marland Kitchen HYDROCODONE-ACETAMINOPHEN 5-325 MG PO TABS Oral Take 1-2 tablets by mouth every 4 (four) hours as needed. For pain    . PANTOPRAZOLE SODIUM 40 MG PO TBEC Oral Take 40 mg by mouth 2 (two) times daily before a meal.    .  POTASSIUM CHLORIDE CRYS ER 10 MEQ PO TBCR Oral Take 10 mEq by mouth daily.    Marland Kitchen SIMVASTATIN 40 MG PO TABS Oral Take 40 mg by mouth every evening.    Marland Kitchen SIROLIMUS 1 MG/ML PO SOLN Oral Take 1.5 mg by mouth daily.      BP 152/63  Pulse 75  Temp 98.9 F (37.2 C) (Oral)  Resp 18  SpO2 96%  Physical Exam  Vitals reviewed. Constitutional: No distress.  Musculoskeletal: He exhibits tenderness.       Left lower leg: He exhibits tenderness, swelling and deformity.       Legs: Neurological: He is alert.  Skin: There is erythema.    ED Course  Procedures (including critical care time)  Labs Reviewed - No data to display No results found.   1. Cellulitis, leg   2. Ulcer of calf       MDM   Chad [name] has  a complex medical history, was recently discharged from the hospital. Presents with a new lead develop ulceration to the posterior aspect of his left lower extremity with a marked odorous exudate and surrounding cellulitis. Patient describes increased tenderness swelling and drainage for the last 4 days. Patient immunosuppressed state makes him at greater risk of infectious complications. I also believe patient will need further wound management as ulceration is extensive and surpasses subcutaneous layer. Perhaps patient will be consider for admission and aggressive wound care management along with systemic antibiotics. Patient has been transferred to the emergency department      Jimmie Molly, MD 08/26/11 [phone]

## 2011-08-26 NOTE — ED Notes (Signed)
PT. TRANSFERRED FROM [facility] THIS EVENING , REPORTS PROGRESSING LEFT LOWER LEG WOUND FOR SEVERAL WEEKS WITH DRAINAGE , SENT HERE FOR IV ANTIBIOTIC / PAIN CONTROL, DENIES FEVER .

## 2011-08-27 ENCOUNTER — Inpatient Hospital Stay (HOSPITAL_COMMUNITY)
Admission: EM | Admit: 2011-08-27 | Discharge: 2011-08-30 | DRG: 130 | Disposition: A | Discharge status: Home / Self Care | Payer: BC Managed Care – PPO | Attending: Internal Medicine | Admitting: Internal Medicine

## 2011-08-27 ENCOUNTER — Inpatient Hospital Stay (HOSPITAL_COMMUNITY): Payer: BC Managed Care – PPO

## 2011-08-27 ENCOUNTER — Encounter (HOSPITAL_COMMUNITY): Payer: Self-pay | Admitting: Internal Medicine

## 2011-08-27 DIAGNOSIS — J9600 Acute respiratory failure, unspecified whether with hypoxia or hypercapnia: Secondary | ICD-10-CM

## 2011-08-27 DIAGNOSIS — N179 Acute kidney failure, unspecified: Secondary | ICD-10-CM

## 2011-08-27 DIAGNOSIS — I420 Dilated cardiomyopathy: Secondary | ICD-10-CM | POA: Diagnosis present

## 2011-08-27 DIAGNOSIS — G7281 Critical illness myopathy: Secondary | ICD-10-CM

## 2011-08-27 DIAGNOSIS — I5021 Acute systolic (congestive) heart failure: Secondary | ICD-10-CM

## 2011-08-27 DIAGNOSIS — L089 Local infection of the skin and subcutaneous tissue, unspecified: Secondary | ICD-10-CM

## 2011-08-27 DIAGNOSIS — I428 Other cardiomyopathies: Secondary | ICD-10-CM

## 2011-08-27 DIAGNOSIS — A419 Sepsis, unspecified organism: Secondary | ICD-10-CM

## 2011-08-27 DIAGNOSIS — J189 Pneumonia, unspecified organism: Secondary | ICD-10-CM

## 2011-08-27 DIAGNOSIS — L03119 Cellulitis of unspecified part of limb: Secondary | ICD-10-CM

## 2011-08-27 DIAGNOSIS — I4892 Unspecified atrial flutter: Secondary | ICD-10-CM | POA: Diagnosis present

## 2011-08-27 DIAGNOSIS — H1032 Unspecified acute conjunctivitis, left eye: Secondary | ICD-10-CM | POA: Diagnosis present

## 2011-08-27 DIAGNOSIS — J80 Acute respiratory distress syndrome: Secondary | ICD-10-CM

## 2011-08-27 DIAGNOSIS — I429 Cardiomyopathy, unspecified: Secondary | ICD-10-CM

## 2011-08-27 DIAGNOSIS — T8610 Unspecified complication of kidney transplant: Secondary | ICD-10-CM | POA: Diagnosis present

## 2011-08-27 DIAGNOSIS — D696 Thrombocytopenia, unspecified: Secondary | ICD-10-CM

## 2011-08-27 DIAGNOSIS — L03116 Cellulitis of left lower limb: Secondary | ICD-10-CM | POA: Diagnosis present

## 2011-08-27 DIAGNOSIS — L97929 Non-pressure chronic ulcer of unspecified part of left lower leg with unspecified severity: Secondary | ICD-10-CM | POA: Diagnosis present

## 2011-08-27 DIAGNOSIS — L97909 Non-pressure chronic ulcer of unspecified part of unspecified lower leg with unspecified severity: Secondary | ICD-10-CM

## 2011-08-27 LAB — CBC WITH DIFFERENTIAL/PLATELET
Eosinophils Absolute: 1 10*3/uL — ABNORMAL HIGH (ref 0.0–0.7)
Lymphs Abs: 2 10*3/uL (ref 0.7–4.0)
MCH: 27.8 pg (ref 26.0–34.0)
MCHC: 32.5 g/dL (ref 30.0–36.0)
Monocytes Absolute: 1.4 10*3/uL — ABNORMAL HIGH (ref 0.1–1.0)
Neutrophils Relative %: 52 % (ref 43–77)
Platelets: 192 10*3/uL (ref 150–400)

## 2011-08-27 LAB — COMPREHENSIVE METABOLIC PANEL
ALT: <5 U/L (ref 0–53)
AST: 17 U/L (ref 0–37)
Alkaline Phosphatase: 56 U/L (ref 39–117)
CO2: 25 mEq/L (ref 19–32)
Chloride: 96 mEq/L (ref 96–112)
Creatinine, Ser: 4.24 mg/dL — ABNORMAL HIGH (ref 0.50–1.35)
GFR calc non Af Amer: 15 mL/min — ABNORMAL LOW (ref >90)
Potassium: 3.4 mEq/L — ABNORMAL LOW (ref 3.5–5.1)
Sodium: 136 mEq/L (ref 135–145)
Total Bilirubin: 0.3 mg/dL (ref 0.3–1.2)

## 2011-08-27 MED ORDER — SODIUM CHLORIDE 0.9 % IJ SOLN
3.0000 mL | Freq: Two times a day (BID) | INTRAMUSCULAR | Status: DC
  Administered 2011-08-27 – 2011-08-30 (×5): 3 mL via INTRAVENOUS

## 2011-08-27 MED ORDER — SIROLIMUS 1 MG/ML PO SOLN
1.5000 mg | Freq: Every day | ORAL | Status: DC
  Administered 2011-08-27 – 2011-08-30 (×4): 1.5 mg via ORAL
  Filled 2011-08-27 (×4): qty 1.5

## 2011-08-27 MED ORDER — SODIUM CHLORIDE 0.9 % IV SOLN
1500.0000 mg | INTRAVENOUS | Status: DC
  Administered 2011-08-29: 1500 mg via INTRAVENOUS
  Filled 2011-08-27: qty 1500

## 2011-08-27 MED ORDER — DEXTROSE 5 % IV SOLN
1.0000 g | INTRAVENOUS | Status: DC
  Administered 2011-08-27 – 2011-08-30 (×4): 1 g via INTRAVENOUS
  Filled 2011-08-27 (×5): qty 1

## 2011-08-27 MED ORDER — POTASSIUM CHLORIDE CRYS ER 10 MEQ PO TBCR
10.0000 meq | EXTENDED_RELEASE_TABLET | Freq: Every day | ORAL | Status: DC
  Administered 2011-08-27 – 2011-08-30 (×4): 10 meq via ORAL
  Filled 2011-08-27 (×4): qty 1

## 2011-08-27 MED ORDER — VANCOMYCIN HCL IN DEXTROSE 1-5 GM/200ML-% IV SOLN
1000.0000 mg | Freq: Once | INTRAVENOUS | Status: DC
  Filled 2011-08-27: qty 200

## 2011-08-27 MED ORDER — FUROSEMIDE 40 MG PO TABS
40.0000 mg | ORAL_TABLET | Freq: Two times a day (BID) | ORAL | Status: DC
  Administered 2011-08-27 – 2011-08-30 (×7): 40 mg via ORAL
  Filled 2011-08-27 (×10): qty 1

## 2011-08-27 MED ORDER — SIROLIMUS 1 MG/ML PO SOLN: 1.5000 mg | Freq: Every day | ORAL | Status: DC

## 2011-08-27 MED ORDER — CARVEDILOL 3.125 MG PO TABS
3.1250 mg | ORAL_TABLET | Freq: Two times a day (BID) | ORAL | Status: DC
  Administered 2011-08-27 – 2011-08-30 (×7): 3.125 mg via ORAL
  Filled 2011-08-27 (×9): qty 1

## 2011-08-27 MED ORDER — AMIODARONE HCL 200 MG PO TABS
200.0000 mg | ORAL_TABLET | Freq: Every day | ORAL | Status: DC
  Administered 2011-08-27 – 2011-08-30 (×4): 200 mg via ORAL
  Filled 2011-08-27 (×4): qty 1

## 2011-08-27 MED ORDER — CALCITRIOL 0.25 MCG PO CAPS
0.2500 ug | ORAL_CAPSULE | Freq: Every day | ORAL | Status: DC
  Administered 2011-08-27 – 2011-08-30 (×4): 0.25 ug via ORAL
  Filled 2011-08-27 (×4): qty 1

## 2011-08-27 MED ORDER — ACETAMINOPHEN 650 MG RE SUPP: 650.0000 mg | Freq: Four times a day (QID) | RECTAL | Status: DC | PRN

## 2011-08-27 MED ORDER — SIROLIMUS 1 MG PO TABS
1.5000 mg | ORAL_TABLET | Freq: Every day | ORAL | Status: DC
  Filled 2011-08-27 (×2): qty 2

## 2011-08-27 MED ORDER — VANCOMYCIN HCL 1000 MG IV SOLR
2000.0000 mg | Freq: Once | INTRAVENOUS | Status: AC
  Administered 2011-08-27: 2000 mg via INTRAVENOUS
  Filled 2011-08-27 (×2): qty 2000

## 2011-08-27 MED ORDER — FAMOTIDINE 20 MG PO TABS
20.0000 mg | ORAL_TABLET | Freq: Two times a day (BID) | ORAL | Status: DC
  Administered 2011-08-27 – 2011-08-30 (×7): 20 mg via ORAL
  Filled 2011-08-27 (×8): qty 1

## 2011-08-27 MED ORDER — HEPARIN SODIUM (PORCINE) 5000 UNIT/ML IJ SOLN
5000.0000 [IU] | Freq: Three times a day (TID) | INTRAMUSCULAR | Status: DC
  Administered 2011-08-27 – 2011-08-30 (×9): 5000 [IU] via SUBCUTANEOUS
  Filled 2011-08-27 (×12): qty 1

## 2011-08-27 MED ORDER — ONDANSETRON HCL 4 MG PO TABS: 4.0000 mg | ORAL_TABLET | Freq: Four times a day (QID) | ORAL | Status: DC | PRN

## 2011-08-27 MED ORDER — HYDROCODONE-ACETAMINOPHEN 5-325 MG PO TABS
1.0000 | ORAL_TABLET | ORAL | Status: DC | PRN
  Administered 2011-08-27: 1 via ORAL
  Administered 2011-08-27: 2 via ORAL
  Administered 2011-08-27: 1 via ORAL
  Administered 2011-08-28 – 2011-08-30 (×5): 2 via ORAL
  Filled 2011-08-27: qty 2
  Filled 2011-08-27 (×3): qty 1
  Filled 2011-08-27 (×3): qty 2
  Filled 2011-08-27: qty 1
  Filled 2011-08-27: qty 2

## 2011-08-27 MED ORDER — PIPERACILLIN-TAZOBACTAM 3.375 G IVPB
3.3750 g | Freq: Once | INTRAVENOUS | Status: AC
  Administered 2011-08-27: 3.375 g via INTRAVENOUS
  Filled 2011-08-27: qty 50

## 2011-08-27 MED ORDER — SIMVASTATIN 40 MG PO TABS
40.0000 mg | ORAL_TABLET | Freq: Every evening | ORAL | Status: DC
  Administered 2011-08-27 – 2011-08-29 (×3): 40 mg via ORAL
  Filled 2011-08-27 (×2): qty 1

## 2011-08-27 MED ORDER — PANTOPRAZOLE SODIUM 40 MG PO TBEC
40.0000 mg | DELAYED_RELEASE_TABLET | Freq: Two times a day (BID) | ORAL | Status: DC
  Administered 2011-08-27 – 2011-08-30 (×7): 40 mg via ORAL
  Filled 2011-08-27 (×8): qty 1

## 2011-08-27 MED ORDER — METRONIDAZOLE IN NACL 5-0.79 MG/ML-% IV SOLN
500.0000 mg | Freq: Three times a day (TID) | INTRAVENOUS | Status: DC
  Administered 2011-08-27 – 2011-08-30 (×10): 500 mg via INTRAVENOUS
  Filled 2011-08-27 (×12): qty 100

## 2011-08-27 MED ORDER — TOBRAMYCIN-DEXAMETHASONE 0.3-0.1 % OP SUSP
1.0000 [drp] | Freq: Four times a day (QID) | OPHTHALMIC | Status: DC
  Administered 2011-08-27 – 2011-08-30 (×13): 1 [drp] via OPHTHALMIC
  Filled 2011-08-27: qty 2.5

## 2011-08-27 MED ORDER — ONDANSETRON HCL 4 MG/2ML IJ SOLN
4.0000 mg | Freq: Four times a day (QID) | INTRAMUSCULAR | Status: DC | PRN
  Filled 2011-08-27: qty 2

## 2011-08-27 MED ORDER — COLLAGENASE 250 UNIT/GM EX OINT
TOPICAL_OINTMENT | Freq: Every day | CUTANEOUS | Status: DC
  Administered 2011-08-27 – 2011-08-30 (×4): via TOPICAL
  Filled 2011-08-27: qty 30

## 2011-08-27 MED ORDER — SIMVASTATIN 40 MG PO TABS
40.0000 mg | ORAL_TABLET | Freq: Every evening | ORAL | Status: DC
  Filled 2011-08-27: qty 1

## 2011-08-27 MED ORDER — ACETAMINOPHEN 325 MG PO TABS: 650.0000 mg | ORAL_TABLET | Freq: Four times a day (QID) | ORAL | Status: DC | PRN

## 2011-08-27 NOTE — ED Notes (Signed)
Lab drew blood cultures

## 2011-08-27 NOTE — Consult Note (Signed)
WOC consult Note Reason for Consult: Consult requested for left posterior leg wound.  Pt has generalized edema and previously had another wound to leg which healed.  He states someone applied a dressing too tightly at the SNF and this resulted in a blister which ruptured and evolved into a stasis ulcer during the past week. Wound type: Full thickness stasis ulcer. Measurement: 2X3X.8cm Wound bed:95% yellow/black slough, 5% red. Drainage (amount, consistency, odor) Mod tan-green drainage, strong foul odor. Periwound: Edema surrounding to entire leg. Dressing procedure/placement/frequency: Primary team at bedside to assess wound.  Physical therapy to begin hydrotherapy to assist with removal of non-viable tissue. This will not need to be continued after discharge, but will help expedite removal of slough while in-patient. Santyl for chemical debridement. This topical treatment should be continued after discharge. Pt could benefit from follow-up with the outpatient wound care center after discharge for continued monitoring if desired.  Cammie Mcgee, RN, MSN, [employer]  [phone]

## 2011-08-27 NOTE — Progress Notes (Signed)
Pt arrived to the floor via stretcher accompanied by ED staff. Transferred to the bed and admission assessment and history completed. Bed lowered to lowest position, wheels locked. Pt has no complaints of pain rated 8 out of 10. No complaints of shortness of breath. Will continue to assess pt periodically.

## 2011-08-27 NOTE — ED Provider Notes (Signed)
History     CSN: [id number]  Arrival date & time 08/26/11  2002   First MD Initiated Contact with Patient 08/27/11 0056      Chief Complaint  Patient presents with  . Wound Infection    (Consider location/radiation/quality/duration/timing/severity/associated sxs/prior treatment) HPI 56 year old male presents to emergency department complaining of left leg wound. Patient reports he had prolonged hospital stay due to pneumonia, and during the hospital stay he had an ulcer develop in his left leg due to tight bandages. Patient had had a visiting home health nurse, who had check his wounds after he was sent home from the hospital, but she left last week. Patient reports over last 3-4 days he noticed a foul smell coming from his leg. Patient was unable to visualize the ulcer due to his body habitus. Patient called his nephrologist today, and was told to go to an urgent care. Patient was seen in urgent care and found to have a deep necrotic ulcer on the posterior aspect of his left leg. He was sent to the emergency department for IV antibiotics and admission. Patient denies fever or chills. Patient has been home from the hospital for 3 weeks. He denies previous history of leg ulcers or wounds.  Past Medical History  Diagnosis Date  . Hypertension   . H/O kidney transplant   . Hepatitis   . Chronic kidney disease   . Pneumonia     Past Surgical History  Procedure Date  . Nephrectomy transplanted organ   . Insertion of dialysis catheter 06/20/2011    Procedure: INSERTION OF DIALYSIS CATHETER;  Surgeon: [name], MD;  Location: [facility] OR;  Service: Vascular;  Laterality: Right;  Ultrasound guided insertion of right internal jugular dialysis catheter  . Multiple extractions with alveoloplasty 06/27/2011    Procedure: MULTIPLE EXTRACION WITH ALVEOLOPLASTY;  Surgeon: [name], DDS;  Location: [facility] OR;  Service: Oral Surgery;  Laterality: N/A;  Extraction  of tooth # 14 with alveoloplasty   . Hip surgery     History reviewed. No pertinent family history.  History  Substance Use Topics  . Smoking status: Never Smoker   . Smokeless tobacco: Not on file  . Alcohol Use: No      Review of Systems  All other systems reviewed and are negative.   other than listed in history of present illness  Allergies  Review of patient's allergies indicates no known allergies.  Home Medications  No current outpatient prescriptions on file.  BP 119/74  Pulse 76  Temp 98.9 F (37.2 C) (Oral)  Resp 18  Ht 6\' 1"  (1.854 m)  Wt 248 lb 14.4 oz (112.9 kg)  BMI 32.84 kg/m2  SpO2 95%  Physical Exam  Nursing note and vitals reviewed. Constitutional: He is oriented to person, place, and time. He appears well-developed and well-nourished.       Morbid obesity  HENT:  Head: Normocephalic and atraumatic.  Nose: Nose normal.  Mouth/Throat: Oropharynx is clear and moist.  Eyes: Conjunctivae and EOM are normal. Pupils are equal, round, and reactive to light.  Neck: Normal range of motion. Neck supple. No JVD present. No tracheal deviation present. No thyromegaly present.  Cardiovascular: Normal rate, regular rhythm, normal heart sounds and intact distal pulses.  Exam reveals no gallop and no friction rub.   No murmur heard. Pulmonary/Chest: Effort normal and breath sounds normal. No stridor. No respiratory distress. He has no wheezes. He has no rales. He exhibits no tenderness.  Abdominal: Soft.  Bowel sounds are normal. He exhibits no distension and no mass. There is no tenderness. There is no rebound and no guarding.  Musculoskeletal: Normal range of motion. He exhibits edema (chronic lower exremity venous stasis with swelling). He exhibits no tenderness.       3 cm necrotic ulcerative lesion to left lower leg with purulent drainage and moderate surrounding cellulitis  Lymphadenopathy:    He has no cervical adenopathy.  Neurological: He is alert and oriented to person, place, and time.  He exhibits normal muscle tone. Coordination normal.  Skin: Skin is warm and dry. No rash noted. No erythema. No pallor.  Psychiatric: He has a normal mood and affect. His behavior is normal. Judgment and thought content normal.    ED Course  Procedures (including critical care time)  Labs Reviewed  CBC WITH DIFFERENTIAL - Abnormal; Notable for the following:    WBC 13.0 (*)     Hemoglobin 12.2 (*)     HCT 37.3 (*)     RDW 16.8 (*)     Eosinophils Relative 12 (*)     Monocytes Absolute 1.4 (*)     Eosinophils Absolute 1.6 (*)     All other components within normal limits  BASIC METABOLIC PANEL - Abnormal; Notable for the following:    Chloride 94 (*)     Glucose, Bld 102 (*)     BUN 47 (*)     Creatinine, Ser 4.07 (*)     GFR calc non Af Amer 15 (*)     GFR calc Af Amer 18 (*)     All other components within normal limits  CULTURE, BLOOD (ROUTINE X 2)  CULTURE, BLOOD (ROUTINE X 2)  WOUND CULTURE  COMPREHENSIVE METABOLIC PANEL  CBC WITH DIFFERENTIAL  CBC   No results found.   1. Infected traumatic leg ulcer   2. Cardiomyopathy   3. Cellulitis of leg, left   4. Renal transplant disorder       MDM  56 year old male with necrotic leg ulcer. He should is status post kidney transplant and is on rejection medications. Patient will prior admission to the hospital and IV antibiotics and close wound care with possible surgery consult. Discussed with hospitalist for admission        Olivia Mackie, MD 08/27/11 [phone]

## 2011-08-27 NOTE — Progress Notes (Signed)
Patient ID: Chad [name]  male  [name]:[id number]    DOB: 09-20-1955    DOA: 08/27/2011  PCP: [name], MD  Subjective: Patient seen and examined this morning. Wound inspected on the left lower leg, foul-smelling discharge, necrotic, tenderness. No fevers or chills, chest pain or shortness of breath. Also noticed redness in left eye since yesterday per patient. No visual changes, blurriness of vision, double vision or any headache.  Objective: Weight change:   Intake/Output Summary (Last 24 hours) at 08/27/11 1157 Last data filed at 08/27/11 1100  Gross per 24 hour  Intake   1260 ml  Output    100 ml  Net   1160 ml   Blood pressure 129/87, pulse 77, temperature 98.8 F (37.1 C), temperature source Oral, resp. rate 18, height 6\' 1"  (1.854 m), weight 112.9 kg (248 lb 14.4 oz), SpO2 96.00%.  Physical Exam: General: Alert and awake, oriented x3, not in any acute distress. HEENT: anicteric sclera, pupils reactive to light and accommodation, EOMI, redness and hemorrhage in the left sclera CVS: S1-S2 clear, no murmur rubs or gallops Chest: clear to auscultation bilaterally, no wheezing, rales or rhonchi Abdomen: soft nontender, nondistended, normal bowel sounds, no organomegaly Extremities: no cyanosis, clubbing noted bilaterally, significant lymphedema in bilaterally with about 3 cm foul-smelling ulcer on the left leg with granulation tissue and purulent discharge. Neuro: Cranial nerves II-XII intact, no focal neurological deficits  Lab Results: Basic Metabolic Panel:  Lab 08/27/11 5784 08/26/11 2225  NA 136 135  K 3.4* 3.9  CL 96 94*  CO2 25 26  GLUCOSE 77 102*  BUN 49* 47*  CREATININE 4.24* 4.07*  CALCIUM 8.8 9.5  MG -- --  PHOS -- --   Liver Function Tests:  Lab 08/27/11 0635  AST 17  ALT <5  ALKPHOS 56  BILITOT 0.3  PROT 6.9  ALBUMIN 2.5*   CBC:  Lab 08/27/11 0635 08/26/11 2225  WBC 9.3 13.0*  NEUTROABS 4.9 --  HGB 9.8* 12.2*  HCT 30.2* 37.3*  MCV 85.6  85.7  PLT 192 224    Micro Results: Recent Results (from the past 240 hour(s))  WOUND CULTURE     Status: Normal (Preliminary result)   Collection Time   08/27/11  1:12 AM      Component Value Range Status Comment   Specimen Description WOUND   Final    Special Requests LEFT LOWER LEG   Final    Gram Stain     Final    Value: NO WBC SEEN     NO SQUAMOUS EPITHELIAL CELLS SEEN     ABUNDANT GRAM POSITIVE COCCI IN PAIRS     ABUNDANT GRAM NEGATIVE COCCOBACILLI   Culture PENDING   Incomplete    Report Status PENDING   Incomplete     Studies/Results: Ct Abdomen Pelvis Wo Contrast  08/09/2011  *RADIOLOGY REPORT*  Clinical Data: Abdominal pain  CT ABDOMEN AND PELVIS WITHOUT CONTRAST  Technique:  Multidetector CT imaging of the abdomen and pelvis was performed following the standard protocol without intravenous contrast.  Comparison: 03/30/2009  Findings: Cardiomegaly.  Coronary artery calcification.  Aortic valve calcification.  Small pericardial effusion.  Organ abnormality/lesion detection is limited in the absence of intravenous contrast. Within this limitation, unremarkable liver, biliary system, spleen, pancreas, adrenal glands.  Atrophic native kidneys with cortical thinning and nonspecific calcifications.  Right lower quadrant renal transplant the previously described cystic lesion has decreased in size. Two lesions arising from the upper pole, one of  which is hyperdense, the other which is hypodense, increased in size in the interval.  Hypodense cyst arising from the lower pole right kidney is incompletely characterized however similar to prior.  No hydronephrosis or hydroureter.  No bowel obstruction.  Colonic diverticulosis.  No free intraperitoneal air or fluid.  No lymphadenopathy.  There is scattered atherosclerotic calcification of the aorta and its branches. No aneurysmal dilatation.  Retroaortic left renal vein.  Thin-walled bladder.  Scarring along the anterior right subcutaneous fat.   Atrophy of the right rectus abdominus musculature.  Left femoral hardware/ hip arthroplasty results in streak artifact. Allowing for this, no definite acute osseous abnormality identified.  Multilevel degenerative changes.  Osteopenia. Multilevel vertebral body height loss.  IMPRESSION: Within limitations of a noncontrast examination, no definite acute abnormality.  Transplant kidney right lower quadrant.  No hydronephrosis. Multiple renal lesions are incompletely characterized.  Recommend non emergent ultrasound follow-up.  Original Report Authenticated By: Waneta Martins, M.D.   Mr Tibia Fibula Left Wo Contrast  08/27/2011  *RADIOLOGY REPORT*  Clinical Data: Medial distal lower leg draining wound.  History of renal transplant, hepatitis C, chronic lower extremity lymphedema and cellulitis.  MRI OF THE LEFT LOWER LEG WITHOUT CONTRAST  Technique:  Multiplanar, multisequence MR imaging of the left lower leg was performed.  No intravenous contrast was administered.  Comparison:  Left knee radiographs 03/30/2009 and CT 03/31/2009.  Findings:  Study was performed using the body coil.  Both lower legs are included on the axial and coronal images.  Both lower legs are massively enlarged by extensive subcutaneous edema and ill- defined fluid.  The findings appear fairly symmetric.  The presumed wound involving the medial left lower leg appears best seen on coronal images 9-13. No focal underlying fluid collection, foreign body or soft tissue emphysema is seen in this region.  The musculature throughout both lower legs is diffusely atrophied with fatty replacement.  In addition to the fatty replacement, there is fairly diffuse muscular edema which may be slightly worse on the left.  No focal fluid collection is seen.  Central marrow heterogeneity is noted proximally in the left tibia, not well visualized on available prior studies.  This appears nonacute.  There is no generalized marrow edema or cortical destruction.   Asymmetric degenerative changes are partially imaged at the left knee.  There is focal transverse signal within the distal fibular diaphysis which may reflect an old fracture.  IMPRESSION:  1.  There is extensive fairly symmetric subcutaneous and muscular edema throughout both lower legs.  This may be related to the given history of chronic lymphedema or superimposed cellulitis.  The muscular edema is nonspecific and could indicate subacute denervation given the associated fatty replacement, although myofasciitis not excluded. 2.  No focal fluid collection identified.  The reported left lower leg wound is fairly subtle and without adjacent focal fluid collection. 3.  No evidence of osteomyelitis. 4.  Marrow changes within the proximal tibia appeared nonacute. Suspected old fracture of the distal left fibula.  Plain film correlation recommended if there are any acute symptoms referable to this area.  Original Report Authenticated By: Gerrianne Scale, M.D.    Medications: Scheduled Meds:   . amiodarone  200 mg Oral Daily  . calcitRIOL  0.25 mcg Oral Daily  . carvedilol  3.125 mg Oral BID WC  . ceFEPime (MAXIPIME) IV  1 g Intravenous Q24H  . collagenase   Topical Daily  . famotidine  20 mg Oral BID  .  furosemide  40 mg Oral BID  . heparin  5,000 Units Subcutaneous Q8H  . metronidazole  500 mg Intravenous Q8H  . pantoprazole  40 mg Oral BID AC  . piperacillin-tazobactam (ZOSYN)  IV  3.375 g Intravenous Once  . potassium chloride  10 mEq Oral Daily  . simvastatin  40 mg Oral QPM  . sirolimus  1.5 mg Oral Daily  . sodium chloride  3 mL Intravenous Q12H  . tobramycin-dexamethasone  1 drop Left Eye Q6H  . vancomycin  1,500 mg Intravenous Q48H  . vancomycin  2,000 mg Intravenous Once  . DISCONTD: simvastatin  40 mg Oral QPM  . DISCONTD: sirolimus  1.5 mg Oral Daily  . DISCONTD: vancomycin  1,000 mg Intravenous Once   Continuous Infusions:    Assessment/Plan: Principal Problem:  *Cellulitis  of leg, left with ulcer  - MRI reviewed, no osteomyelitis - Followup on wound cultures, appreciate wound care consult, will benefit from hydrotherapy and chemical debridement inpatient - Patient will benefit from wound care center outpatient followup - Continue vancomycin, cefepime and metronidazole, will await final cultures and sensitivities.  Active Problems:  Renal transplant disorder/ CK D: - Creatinine stable, at baseline, discussed with Dr. Eliott Nine, no acute renal issues, continue to closely monitor - Continue sirolimus   Cardiomyopathy with Atrial flutter: Heart rate controlled, no pulmonary edema, continue current management - Continue amiodarone, not on anticoagulation outpatient    Conjunctivitis, acute, left eye - Start on tobramycin eye drops.  DVT Prophylaxis:  Code Status: Full  Disposition: Not medically ready   LOS: 0 days   RAI,RIPUDEEP M.D. [facility] 08/27/2011, 11:57 AM Pager: [phone]  If 7PM-7AM, please contact night-coverage [email or web address] Password [email or web address]

## 2011-08-27 NOTE — Progress Notes (Signed)
ANTIBIOTIC CONSULT NOTE - INITIAL  Pharmacy Consult for vancomycin and cefepime Indication: necrotic non-healing ulcer  No Known Allergies  Patient Measurements: Height: 6\' 1"  (185.4 cm) Weight: 248 lb 14.4 oz (112.9 kg) IBW/kg (Calculated) : 79.9  Adjusted Body Weight:   Vital Signs: Temp: 99 F (37.2 C) (07/31 0329) Temp src: Oral (07/31 0329) BP: 159/88 mmHg (07/31 0329) Pulse Rate: 78  (07/31 0329) Intake/Output from previous day:   Intake/Output from this shift:    Labs:  Basename 08/26/11 2225  WBC 13.0*  HGB 12.2*  PLT 224  LABCREA --  CREATININE 4.07*   Estimated Creatinine Clearance: 27.3 ml/min (by C-G formula based on Cr of 4.07). No results found for this basename: VANCOTROUGH:2,VANCOPEAK:2,VANCORANDOM:2,GENTTROUGH:2,GENTPEAK:2,GENTRANDOM:2,TOBRATROUGH:2,TOBRAPEAK:2,TOBRARND:2,AMIKACINPEAK:2,AMIKACINTROU:2,AMIKACIN:2, in the last 72 hours   Microbiology: No results found for this or any previous visit (from the past 720 hour(s)).  Medical History: Past Medical History  Diagnosis Date  . Hypertension   . H/O kidney transplant   . Hepatitis   . Chronic kidney disease   . Pneumonia     Medications:  Prescriptions prior to admission  Medication Sig Dispense Refill  . amiodarone (PACERONE) 200 MG tablet Take 200 mg by mouth daily.      . calcitRIOL (ROCALTROL) 0.25 MCG capsule Take 0.25 mcg by mouth daily.      . carvedilol (COREG) 3.125 MG tablet Take 3.125 mg by mouth 2 (two) times daily with a meal.      . famotidine (PEPCID) 20 MG tablet Take 1 tablet (20 mg total) by mouth 2 (two) times daily.  30 tablet  0  . furosemide (LASIX) 40 MG tablet Take 40 mg by mouth 2 (two) times daily.      Marland Kitchen HYDROcodone-acetaminophen (NORCO) 5-325 MG per tablet Take 1-2 tablets by mouth every 4 (four) hours as needed. For pain      . pantoprazole (PROTONIX) 40 MG tablet Take 40 mg by mouth 2 (two) times daily before a meal.      . potassium chloride (K-DUR,KLOR-CON)  10 MEQ tablet Take 10 mEq by mouth daily.      . simvastatin (ZOCOR) 40 MG tablet Take 40 mg by mouth every evening.      . sirolimus (RAPAMUNE) 1 MG/ML solution Take 1.5 mg by mouth daily.       Assessment: Necrotic non-healing left leg ulcer.   Goal of Therapy:  Vancomycin trough level 10-15 mcg/ml  Plan:  vancomcyin 2 gm iv  x1  Then 1500 mg q48 hours.  Cefepime 1 gm q24 hours.     Janice Coffin 08/27/2011,4:04 AM

## 2011-08-27 NOTE — H&P (Signed)
Chad [name] is an 56 y.o. male.  Patient seen and examined on August 27, 2011 at 2.45 AM. PCP/Nephrologist - Dr.Fox.  Chief Complaint: Left leg wound getting worse. HPI: 56 year old male with renal transplant, hypertension and hepatitis C who was recently in the hospital 2 months ago and had undergone a complicated course with acute respiratory failure secondary to parainfluenza pneumonia requiring intubation tracheostomy and at that time was in cardiogenic and septic shock and patient also had cardiomyopathy with EF 15-20% and patient had atrial flutter and presently on amiodarone had a wound on his left leg prior to last admission. Patient does have chronic lymphedema of both lower extremity. While in the hospital last time patient did develop cellulitis of the lower extremities and was placed on vancomycin. Patient states he does a small wound which was draining some clear fluid. Over the last one week the edges of the wound became more necrotic(blackish color) and over the last 2 days started developing purulent discharge with some pain. He had gone to the urgent care yesterday and was instructed to come to the ER. At this time patient is afebrile but the ulcer on the left leg has necrotic edges with purulent discharge. Patient will be admitted for further management.  Past Medical History  Diagnosis Date  . Hypertension   . H/O kidney transplant   . Hepatitis   . Chronic kidney disease   . Pneumonia     Past Surgical History  Procedure Date  . Nephrectomy transplanted organ   . Insertion of dialysis catheter 06/20/2011    Procedure: INSERTION OF DIALYSIS CATHETER;  Surgeon: [name], MD;  Location: [facility] OR;  Service: Vascular;  Laterality: Right;  Ultrasound guided insertion of right internal jugular dialysis catheter  . Multiple extractions with alveoloplasty 06/27/2011    Procedure: MULTIPLE EXTRACION WITH ALVEOLOPLASTY;  Surgeon: [name], DDS;  Location: [facility] OR;  Service:  Oral Surgery;  Laterality: N/A;  Extraction  of tooth # 14 with alveoloplasty  . Hip surgery     History reviewed. No pertinent family history. Social History:  reports that he has never smoked. He does not have any smokeless tobacco history on file. He reports that he does not drink alcohol or use illicit drugs.  Allergies: No Known Allergies   (Not in a hospital admission)  Results for orders placed during the hospital encounter of 08/27/11 (from the past 48 hour(s))  CBC WITH DIFFERENTIAL     Status: Abnormal   Collection Time   08/26/11 10:25 PM      Component Value Range Comment   WBC 13.0 (*) 4.0 - 10.5 K/uL    RBC 4.35  4.22 - 5.81 MIL/uL    Hemoglobin 12.2 (*) 13.0 - 17.0 g/dL    HCT 16.1 (*) 09.6 - 52.0 %    MCV 85.7  78.0 - 100.0 fL    MCH 28.0  26.0 - 34.0 pg    MCHC 32.7  30.0 - 36.0 g/dL    RDW 04.5 (*) 40.9 - 15.5 %    Platelets 224  150 - 400 K/uL    Neutrophils Relative 57  43 - 77 %    Lymphocytes Relative 20  12 - 46 %    Monocytes Relative 11  3 - 12 %    Eosinophils Relative 12 (*) 0 - 5 %    Basophils Relative 0  0 - 1 %    Neutro Abs 7.4  1.7 - 7.7 K/uL  Lymphs Abs 2.6  0.7 - 4.0 K/uL    Monocytes Absolute 1.4 (*) 0.1 - 1.0 K/uL    Eosinophils Absolute 1.6 (*) 0.0 - 0.7 K/uL    Basophils Absolute 0.0  0.0 - 0.1 K/uL    Smear Review MORPHOLOGY UNREMARKABLE     BASIC METABOLIC PANEL     Status: Abnormal   Collection Time   08/26/11 10:25 PM      Component Value Range Comment   Sodium 135  135 - 145 mEq/L    Potassium 3.9  3.5 - 5.1 mEq/L    Chloride 94 (*) 96 - 112 mEq/L    CO2 26  19 - 32 mEq/L    Glucose, Bld 102 (*) 70 - 99 mg/dL    BUN 47 (*) 6 - 23 mg/dL    Creatinine, Ser 1.61 (*) 0.50 - 1.35 mg/dL    Calcium 9.5  8.4 - 09.6 mg/dL    GFR calc non Af Amer 15 (*) >90 mL/min    GFR calc Af Amer 18 (*) >90 mL/min    No results found.  Review of Systems  Constitutional: Negative.   HENT: Negative.   Eyes: Negative.   Respiratory:  Negative.   Cardiovascular: Negative.   Gastrointestinal: Negative.   Genitourinary: Negative.   Musculoskeletal:       Left leg pain and discharge from the wound.  Skin: Negative.   Neurological: Negative.   Endo/Heme/Allergies: Negative.   Psychiatric/Behavioral: Negative.     Blood pressure 156/95, pulse 73, temperature 98.8 F (37.1 C), temperature source Oral, resp. rate 20, SpO2 100.00%. Physical Exam  Constitutional: He is oriented to person, place, and time. He appears well-developed.  Eyes:       Left eye medial part of conjunctiva looks congested.  Neck: Normal range of motion. Neck supple.  Cardiovascular: Normal rate and regular rhythm.   Respiratory: Effort normal and breath sounds normal. No respiratory distress. He has no wheezes. He has no rales.  GI: Soft. Bowel sounds are normal. He exhibits no distension. There is no tenderness. There is no rebound.  Musculoskeletal:       Bilateral non pitting edema. Left leg has an ulcer with necrotic edges and purulent base.Measures around 3 cm in diameter.  Neurological: He is alert and oriented to person, place, and time.       Moves all extremities.  Psychiatric: His behavior is normal.     Assessment/Plan #1. Left leg cellulitis - at this time patient has been started on vancomycin and we will continue with cefepime and Flagyl. Patient did get one dose of Zosyn in the ER. Get MRI of the left leg to further study the depth of wound. Keep left leg elevated and consult wound team. At this time patient is not in any acute distress and does not look septic. #2. Chronic allograft nephropathy - continue present medications. Closely follow intake output and metabolic panel. Consult nephrology in a.m. Patient's creatinine of recent appears stable at around 4. #3. Recently diagnosed cardiomyopathy and atrial flutter - presently patient is not short of breath and rate is controlled. Continue present medications. #4. Hypertension -  continue present medications. #5. History of hepatitis C. #6. Recently admitted for pneumonia secondary to parainfluenza requiring intubation and complicated course.  CODE STATUS - full code.  Tonji Elliff N. 08/27/2011, 2:59 AM

## 2011-08-27 NOTE — Progress Notes (Signed)
Hydrotherapy Evaluation   08/27/11 1348  Subjective Assessment  Subjective "It was healing and then it got a bad odor."  Patient and Family Stated Goals Heal wound  Date of Onset 08/20/11  Prior Treatments Dressing changes  Evaluation and Treatment  Evaluation and Treatment Procedures Explained to Patient/Family Yes  Evaluation and Treatment Procedures agreed to  Wound 06/05/11 Other (Comment) Leg Left;Lower open ulcer, black edges with milky drainage  Date First Assessed/Time First Assessed: 06/05/11 0657   Wound Type: Other (Comment)  Location: Leg  Location Orientation: Left;Lower  Wound Description (Comments): open ulcer, black edges with milky drainage  Present on Admission: Yes  Site / Wound Assessment Yellow;Black;Red;Painful  % Wound base Red or Granulating 5%  % Wound base Yellow 70%  % Wound base Black 20%  Peri-wound Assessment Edema;Erythema (blanchable);Induration  Wound Length (cm) 3.5 cm  Wound Width (cm) 6.8 cm  Wound Depth (cm) 0.8 cm  Margins Unattacted edges (unapproximated)  Closure None  Drainage Amount Minimal  Drainage Description Purulent;Green;Odor  Non-staged Wound Description Partial thickness  Treatment Hydrotherapy (Pulse lavage);Debridement (Selective);Packing (Saline gauze) (Santyl, NS W to D 4x4, dry 4x4, skin prep, tape)  Dressing Type Gauze (Comment);Moist to dry (Santyl, NS W to D 4x4, dry 4x4, skin prep, tape)  Dressing Changed New  Dressing Status Clean;Dry;Intact  Hydrotherapy  Pulsed Lavage with Suction (psi) 8 psi  Pulsed Lavage with Suction - Normal Saline Used 1000 mL  Pulsed Lavage Tip Tip with splash shield  Pulsed lavage therapy - wound location Left Posterior Calf  Selective Debridement  Selective Debridement - Location Left Posterior Calf  Selective Debridement - Tools Used Forceps;Scissors  Selective Debridement - Tissue Removed Yellow and black slough  Wound Therapy - Assess/Plan/Recommendations  Wound Therapy - Clinical  Statement Pt is a 56 y/o male admitted with left posterior calf non-healing wound.  Agree that Hydrotherapy would facilitate wound healing and assist with d/c planning for wound care.  Wound Therapy - Functional Problem List Decreased skin integrity causing increased chance of infection.  Factors Delaying/Impairing Wound Healing Altered sensation;Infection - systemic/local;Immobility;Vascular compromise  Hydrotherapy Plan Debridement;Dressing change;Patient/family education;Pulsatile lavage with suction  Wound Therapy - Frequency 6X / week  Wound Therapy - Current Recommendations WOC nurse  Wound Therapy - Follow Up Recommendations Home health RN  Wound Plan Pulse lavage with small shield diverter tip, selective debridement, dressing change.  Wound Therapy Goals - Improve the function of patient's integumentary system by progressing the wound(s) through the phases of wound healing by:  Decrease Necrotic Tissue to 70  Decrease Necrotic Tissue - Progress Goal set today  Increase Granulation Tissue to 30  Increase Granulation Tissue - Progress Goal set today  Improve Drainage Characteristics Serous  Improve Drainage Characteristics - Progress Goal set today  Goals/treatment plan/discharge plan were made with and agreed upon by patient/family Yes  Time For Goal Achievement 7 days  Wound Therapy - Potential for Goals Good    Pain:  7/10 in left LE.  Pt premedicated and repositioned.  08/27/2011 Cephus Shelling, PT, DPT [phone]

## 2011-08-27 NOTE — ED Notes (Signed)
Pt c/o pain and odor in the left leg, Pt reports he had a wound that he believes is infected. Pt's leg has white milky pus draining from site.

## 2011-08-28 ENCOUNTER — Ambulatory Visit: Payer: BC Managed Care – PPO | Admitting: Rehabilitative and Restorative Service Providers"

## 2011-08-28 DIAGNOSIS — N179 Acute kidney failure, unspecified: Secondary | ICD-10-CM

## 2011-08-28 DIAGNOSIS — I4892 Unspecified atrial flutter: Secondary | ICD-10-CM

## 2011-08-28 DIAGNOSIS — L97909 Non-pressure chronic ulcer of unspecified part of unspecified lower leg with unspecified severity: Secondary | ICD-10-CM

## 2011-08-28 DIAGNOSIS — H1030 Unspecified acute conjunctivitis, unspecified eye: Secondary | ICD-10-CM

## 2011-08-28 NOTE — Progress Notes (Signed)
Patient ID: Chad [name]  male  VHQ:[id number]    DOB: July 29, 1955    DOA: 08/27/2011  PCP: [name], MD  Subjective: Patient seen and examined this morning. Undergoing hydrotherapy today, no complaints of any nausea, vomiting, bowel pain, diarrhea. Low-grade temp of 100.6 today morning  Objective: Weight change: 0 kg (0 lb)  Intake/Output Summary (Last 24 hours) at 08/28/11 1401 Last data filed at 08/28/11 0900  Gross per 24 hour  Intake    840 ml  Output   1475 ml  Net   -635 ml   Blood pressure 127/75, pulse 72, temperature 98.4 F (36.9 C), temperature source Oral, resp. rate 18, height 6\' 1"  (1.854 m), weight 112.9 kg (248 lb 14.4 oz), SpO2 92.00%.  Physical Exam: General: Alert and awake, oriented x3, not in any acute distress. HEENT: anicteric sclera, pupils reactive to light and accommodation, EOMI, redness and hemorrhage in the left sclera CVS: S1-S2 clear, no murmur rubs or gallops Chest: clear to auscultation bilaterally, no wheezing, rales or rhonchi Abdomen: soft nontender, nondistended, normal bowel sounds, no organomegaly Extremities: no cyanosis, clubbing noted bilaterally, significant lymphedema b/l, ~ 3 cm foul-smelling ulcer on the left leg with granulation tissue and purulent discharge.   Lab Results: Basic Metabolic Panel:  Lab 08/27/11 4132 08/26/11 2225  NA 136 135  K 3.4* 3.9  CL 96 94*  CO2 25 26  GLUCOSE 77 102*  BUN 49* 47*  CREATININE 4.24* 4.07*  CALCIUM 8.8 9.5  MG -- --  PHOS -- --   Liver Function Tests:  Lab 08/27/11 0635  AST 17  ALT <5  ALKPHOS 56  BILITOT 0.3  PROT 6.9  ALBUMIN 2.5*   CBC:  Lab 08/27/11 0635 08/26/11 2225  WBC 9.3 13.0*  NEUTROABS 4.9 --  HGB 9.8* 12.2*  HCT 30.2* 37.3*  MCV 85.6 85.7  PLT 192 224    Micro Results: Recent Results (from the past 240 hour(s))  CULTURE, BLOOD (ROUTINE X 2)     Status: Normal (Preliminary result)   Collection Time   08/27/11  1:01 AM      Component Value Range  Status Comment   Specimen Description BLOOD LEFT ARM   Final    Special Requests BOTTLES DRAWN AEROBIC ONLY 6CC   Final    Culture  Setup Time 08/27/2011 09:03   Final    Culture     Final    Value:        BLOOD CULTURE RECEIVED NO GROWTH TO DATE CULTURE WILL BE HELD FOR 5 DAYS BEFORE ISSUING A FINAL NEGATIVE REPORT   Report Status PENDING   Incomplete   WOUND CULTURE     Status: Normal (Preliminary result)   Collection Time   08/27/11  1:12 AM      Component Value Range Status Comment   Specimen Description WOUND   Final    Special Requests LEFT LOWER LEG   Final    Gram Stain     Final    Value: NO WBC SEEN     NO SQUAMOUS EPITHELIAL CELLS SEEN     ABUNDANT GRAM POSITIVE COCCI IN PAIRS     ABUNDANT GRAM NEGATIVE COCCOBACILLI   Culture Culture reincubated for better growth   Final    Report Status PENDING   Incomplete   CULTURE, BLOOD (ROUTINE X 2)     Status: Normal (Preliminary result)   Collection Time   08/27/11  1:15 AM      Component  Value Range Status Comment   Specimen Description BLOOD RIGHT ARM   Final    Special Requests BOTTLES DRAWN AEROBIC AND ANAEROBIC 5CC   Final    Culture  Setup Time 08/27/2011 09:03   Final    Culture     Final    Value:        BLOOD CULTURE RECEIVED NO GROWTH TO DATE CULTURE WILL BE HELD FOR 5 DAYS BEFORE ISSUING A FINAL NEGATIVE REPORT   Report Status PENDING   Incomplete   WOUND CULTURE     Status: Normal (Preliminary result)   Collection Time   08/27/11 11:12 AM      Component Value Range Status Comment   Specimen Description WOUND LEFT LEG   Final    Special Requests Immunocompromised   Final    Gram Stain     Final    Value: NO WBC SEEN     NO SQUAMOUS EPITHELIAL CELLS SEEN     FEW GRAM POSITIVE COCCI IN PAIRS     FEW GRAM NEGATIVE RODS   Culture Culture reincubated for better growth   Final    Report Status PENDING   Incomplete   MRSA PCR SCREENING     Status: Normal   Collection Time   08/28/11  9:43 AM      Component Value Range  Status Comment   MRSA by PCR NEGATIVE  NEGATIVE Final     Studies/Results: Ct Abdomen Pelvis Wo Contrast  08/09/2011  *RADIOLOGY REPORT*  Clinical Data: Abdominal pain  CT ABDOMEN AND PELVIS WITHOUT CONTRAST  Technique:  Multidetector CT imaging of the abdomen and pelvis was performed following the standard protocol without intravenous contrast.  Comparison: 03/30/2009  Findings: Cardiomegaly.  Coronary artery calcification.  Aortic valve calcification.  Small pericardial effusion.  Organ abnormality/lesion detection is limited in the absence of intravenous contrast. Within this limitation, unremarkable liver, biliary system, spleen, pancreas, adrenal glands.  Atrophic native kidneys with cortical thinning and nonspecific calcifications.  Right lower quadrant renal transplant the previously described cystic lesion has decreased in size. Two lesions arising from the upper pole, one of which is hyperdense, the other which is hypodense, increased in size in the interval.  Hypodense cyst arising from the lower pole right kidney is incompletely characterized however similar to prior.  No hydronephrosis or hydroureter.  No bowel obstruction.  Colonic diverticulosis.  No free intraperitoneal air or fluid.  No lymphadenopathy.  There is scattered atherosclerotic calcification of the aorta and its branches. No aneurysmal dilatation.  Retroaortic left renal vein.  Thin-walled bladder.  Scarring along the anterior right subcutaneous fat.  Atrophy of the right rectus abdominus musculature.  Left femoral hardware/ hip arthroplasty results in streak artifact. Allowing for this, no definite acute osseous abnormality identified.  Multilevel degenerative changes.  Osteopenia. Multilevel vertebral body height loss.  IMPRESSION: Within limitations of a noncontrast examination, no definite acute abnormality.  Transplant kidney right lower quadrant.  No hydronephrosis. Multiple renal lesions are incompletely characterized.   Recommend non emergent ultrasound follow-up.  Original Report Authenticated By: Waneta Martins, M.D.   Mr Tibia Fibula Left Wo Contrast  08/27/2011  *RADIOLOGY REPORT*  Clinical Data: Medial distal lower leg draining wound.  History of renal transplant, hepatitis C, chronic lower extremity lymphedema and cellulitis.  MRI OF THE LEFT LOWER LEG WITHOUT CONTRAST  Technique:  Multiplanar, multisequence MR imaging of the left lower leg was performed.  No intravenous contrast was administered.  Comparison:  Left knee radiographs  03/30/2009 and CT 03/31/2009.  Findings:  Study was performed using the body coil.  Both lower legs are included on the axial and coronal images.  Both lower legs are massively enlarged by extensive subcutaneous edema and ill- defined fluid.  The findings appear fairly symmetric.  The presumed wound involving the medial left lower leg appears best seen on coronal images 9-13. No focal underlying fluid collection, foreign body or soft tissue emphysema is seen in this region.  The musculature throughout both lower legs is diffusely atrophied with fatty replacement.  In addition to the fatty replacement, there is fairly diffuse muscular edema which may be slightly worse on the left.  No focal fluid collection is seen.  Central marrow heterogeneity is noted proximally in the left tibia, not well visualized on available prior studies.  This appears nonacute.  There is no generalized marrow edema or cortical destruction.  Asymmetric degenerative changes are partially imaged at the left knee.  There is focal transverse signal within the distal fibular diaphysis which may reflect an old fracture.  IMPRESSION:  1.  There is extensive fairly symmetric subcutaneous and muscular edema throughout both lower legs.  This may be related to the given history of chronic lymphedema or superimposed cellulitis.  The muscular edema is nonspecific and could indicate subacute denervation given the associated fatty  replacement, although myofasciitis not excluded. 2.  No focal fluid collection identified.  The reported left lower leg wound is fairly subtle and without adjacent focal fluid collection. 3.  No evidence of osteomyelitis. 4.  Marrow changes within the proximal tibia appeared nonacute. Suspected old fracture of the distal left fibula.  Plain film correlation recommended if there are any acute symptoms referable to this area.  Original Report Authenticated By: Gerrianne Scale, M.D.    Medications: Scheduled Meds:    . amiodarone  200 mg Oral Daily  . calcitRIOL  0.25 mcg Oral Daily  . carvedilol  3.125 mg Oral BID WC  . ceFEPime (MAXIPIME) IV  1 g Intravenous Q24H  . collagenase   Topical Daily  . famotidine  20 mg Oral BID  . furosemide  40 mg Oral BID  . heparin  5,000 Units Subcutaneous Q8H  . metronidazole  500 mg Intravenous Q8H  . pantoprazole  40 mg Oral BID AC  . potassium chloride  10 mEq Oral Daily  . simvastatin  40 mg Oral QPM  . sirolimus  1.5 mg Oral Daily  . sodium chloride  3 mL Intravenous Q12H  . tobramycin-dexamethasone  1 drop Left Eye Q6H  . vancomycin  1,500 mg Intravenous Q48H  . DISCONTD: sirolimus  1.5 mg Oral Daily   Continuous Infusions:    Assessment/Plan: Principal Problem:  *Cellulitis of leg, left with ulcer  - MRI reviewed, no osteomyelitis - Cultures growing GPC and gram-negative bacilli, continue current antibiotic coverage - appreciate wound care and continue hydrotherapy and chemical debridement inpatient - Patient will benefit from wound care center outpatient followup  Active Problems:  Renal transplant disorder/ CK D: - Creatinine stable, at baseline, discussed with Dr. Eliott Nine, no acute renal issues, continue to closely monitor - Continue sirolimus   Cardiomyopathy with Atrial flutter: Heart rate controlled, no pulmonary edema, continue current management - Continue amiodarone, not on anticoagulation outpatient    Conjunctivitis,  acute, left eye - On tobramycin eye drops.  DVT Prophylaxis:  Code Status: Full  Disposition: Not medically ready, plan DC on 08/30/2011 after hydrotherapy and final cultures available  LOS: 1 day   RAI,RIPUDEEP M.D. [facility] 08/28/2011, 2:01 PM Pager: [phone]  If 7PM-7AM, please contact night-coverage [email or web address] Password [email or web address]

## 2011-08-28 NOTE — Progress Notes (Signed)
   CARE MANAGEMENT NOTE 08/28/2011  Patient:  Chad [name], Chad [name]   Account Number:  [id number]  Date Initiated:  08/28/2011  Documentation initiated by:  ROYAL,CHERYL  Subjective/Objective Assessment:   Referral for [facility] and wound center     Action/Plan:   Met with pt re [facility] , he request [facility]  Will set up Wound Care center.   Anticipated DC Date:  09/01/2011   Anticipated DC Plan:  HOME W HOME HEALTH SERVICES         [facility] Choice  HOME HEALTH   Choice offered to / List presented to:  [name]        HH arranged  HH-1 RN      [facility] agency  [facility]   Status of service:  Completed, signed off Medicare Important Message given?   (If response is "NO", the following Medicare IM given date fields will be blank) Date Medicare IM given:   Date Additional Medicare IM given:    Discharge Disposition:  HOME W HOME HEALTH SERVICES  Per UR Regulation:  Reviewed for med. necessity/level of care/duration of stay  If discussed at Long Length of Stay Meetings, dates discussed:    Comments:  08/28/2011  [address] RN, [place]  [phone] Met with patient to discuss discharge planning. He was recently discharged to home with RN and PT visits from [facility] and chose them for home health services. He has not been to the wound center and does have a ride for appointments.   Wound Care Center:  [phone]  Harriett Sine Appointment 09/10/2011  10:00 am patient to arrive at 9:45 am   08/27/2011 Pt for [facility] when ready for d/c, will need hydrotherapy for next few days. Will setup Wound Center when closer to d/c time. Johny Shock RN MPH Case Manager [phone]

## 2011-08-28 NOTE — Progress Notes (Signed)
Utilization review completed.  

## 2011-08-28 NOTE — Progress Notes (Signed)
Hydrotherapy Note:   08/28/11 0857  Subjective Assessment  Subjective "I am ready to do this."  Patient and Family Stated Goals Heal wound  Date of Onset 08/20/11  Prior Treatments Dressing changes  Evaluation and Treatment  Evaluation and Treatment Procedures Explained to Patient/Family Yes  Evaluation and Treatment Procedures agreed to  Wound 06/05/11 Other (Comment) Leg Left;Lower open ulcer, black edges with milky drainage  Date First Assessed/Time First Assessed: 06/05/11 0657   Wound Type: Other (Comment)  Location: Leg  Location Orientation: Left;Lower  Wound Description (Comments): open ulcer, black edges with milky drainage  Present on Admission: Yes  Site / Wound Assessment Yellow;Black;Red;Painful  % Wound base Red or Granulating 5%  % Wound base Yellow 75%  % Wound base Black 15%  Peri-wound Assessment Edema;Erythema (blanchable);Induration  Tunneling (cm) 0.8 cm in depth noted at location of 6 o'clock as well as 0.3 cm in depth noted at location of 12 o'clock today.  Margins Unattacted edges (unapproximated)  Closure None  Drainage Amount Moderate  Drainage Description Purulent;Green;Odor  Non-staged Wound Description Partial thickness  Treatment Hydrotherapy (Pulse lavage);Debridement (Selective);Packing (Saline gauze) (Santyl, NS W to D 4x4, dry 4x4 skin prep, tape)  Dressing Type Gauze (Comment);Moist to dry  Dressing Changed New  Dressing Status Clean;Dry;Intact  Hydrotherapy  Pulsed Lavage with Suction (psi) 8 psi  Pulsed Lavage with Suction - Normal Saline Used 1000 mL  Pulsed Lavage Tip Tip with splash shield  Pulsed lavage therapy - wound location Left Posterior Calf  Selective Debridement  Selective Debridement - Location Left Posterior Calf  Selective Debridement - Tools Used Forceps;Scissors  Selective Debridement - Tissue Removed Yellow and black slough.  Expressed drainage from tunneling.  Wound Therapy - Assess/Plan/Recommendations  Wound Therapy -  Clinical Statement Pt is a 56 y/o male admitted with left posterior calf non-healing wound.  Agree that continued Hydrotherapy would facilitate wound healing and assist with d/c planning for wound care.  Wound Therapy - Functional Problem List Decreased skin integrity causing increased chance of infection.  Factors Delaying/Impairing Wound Healing Altered sensation;Infection - systemic/local;Immobility;Vascular compromise  Hydrotherapy Plan Debridement;Dressing change;Patient/family education;Pulsatile lavage with suction  Wound Therapy - Frequency 6X / week  Wound Therapy - Current Recommendations WOC nurse  Wound Therapy - Follow Up Recommendations Home health RN  Wound Plan Pulse lavage with small shield diverter tip, selective debridement, dressing change.  Wound Therapy Goals - Improve the function of patient's integumentary system by progressing the wound(s) through the phases of wound healing by:  Decrease Necrotic Tissue - Progress Progressing toward goal  Increase Granulation Tissue - Progress Progressing toward goal  Improve Drainage Characteristics - Progress Progressing toward goal  Goals/treatment plan/discharge plan were made with and agreed upon by patient/family Yes  Wound Therapy - Potential for Goals Good    Pain:  None noted after treatment session.  Pt premedicated.  08/28/2011 Cephus Shelling, PT, DPT [phone]

## 2011-08-29 LAB — BASIC METABOLIC PANEL
CO2: 25 mEq/L (ref 19–32)
Calcium: 8.7 mg/dL (ref 8.4–10.5)
Chloride: 101 mEq/L (ref 96–112)
Creatinine, Ser: 4.16 mg/dL — ABNORMAL HIGH (ref 0.50–1.35)
Glucose, Bld: 86 mg/dL (ref 70–99)

## 2011-08-29 LAB — CBC
HCT: 32.2 % — ABNORMAL LOW (ref 39.0–52.0)
MCH: 27.5 pg (ref 26.0–34.0)
MCV: 84.3 fL (ref 78.0–100.0)
Platelets: 184 10*3/uL (ref 150–400)
RDW: 16.8 % — ABNORMAL HIGH (ref 11.5–15.5)

## 2011-08-29 NOTE — Progress Notes (Signed)
Patient ID: Chad [name]  male  JYN:[id number]    DOB: 1955/12/05    DOA: 08/27/2011  PCP: [name], MD  Subjective: Patient seen and examined this morning. Afebrile, no specific complaints, no chest pain, no shortness of breath, fever chills or any abdominal pain. Last bowel movement last night.  Objective: Weight change: 0 kg (0 lb)  Intake/Output Summary (Last 24 hours) at 08/29/11 1323 Last data filed at 08/29/11 0900  Gross per 24 hour  Intake   1420 ml  Output    525 ml  Net    895 ml   Blood pressure 165/78, pulse 76, temperature 98.5 F (36.9 C), temperature source Oral, resp. rate 20, height 6\' 1"  (1.854 m), weight 112.9 kg (248 lb 14.4 oz), SpO2 100.00%.  Physical Exam: General: Alert and awake, oriented x3, not in any acute distress. HEENT: anicteric sclera, pupils reactive to light and accommodation, EOMI, redness and hemorrhage in the left sclera CVS: S1-S2 clear, no murmur rubs or gallops Chest: clear to auscultation bilaterally, no wheezing, rales or rhonchi Abdomen: soft nontender, nondistended, normal bowel sounds, no organomegaly Extremities: no cyanosis, clubbing noted bilaterally, significant lymphedema b/l, ~ 3 cm ulcer on the left lower leg.  Lab Results: Basic Metabolic Panel:  Lab 08/29/11 8657 08/27/11 0635  NA 137 136  K 3.7 3.4*  CL 101 96  CO2 25 25  GLUCOSE 86 77  BUN 48* 49*  CREATININE 4.16* 4.24*  CALCIUM 8.7 8.8  MG -- --  PHOS -- --   Liver Function Tests:  Lab 08/27/11 0635  AST 17  ALT <5  ALKPHOS 56  BILITOT 0.3  PROT 6.9  ALBUMIN 2.5*   CBC:  Lab 08/29/11 0545 08/27/11 0635  WBC 8.6 9.3  NEUTROABS -- 4.9  HGB 10.5* 9.8*  HCT 32.2* 30.2*  MCV 84.3 85.6  PLT 184 192    Micro Results: Recent Results (from the past 240 hour(s))  CULTURE, BLOOD (ROUTINE X 2)     Status: Normal (Preliminary result)   Collection Time   08/27/11  1:01 AM      Component Value Range Status Comment   Specimen Description BLOOD LEFT  ARM   Final    Special Requests BOTTLES DRAWN AEROBIC ONLY 6CC   Final    Culture  Setup Time 08/27/2011 09:03   Final    Culture     Final    Value:        BLOOD CULTURE RECEIVED NO GROWTH TO DATE CULTURE WILL BE HELD FOR 5 DAYS BEFORE ISSUING A FINAL NEGATIVE REPORT   Report Status PENDING   Incomplete   WOUND CULTURE     Status: Normal (Preliminary result)   Collection Time   08/27/11  1:12 AM      Component Value Range Status Comment   Specimen Description WOUND   Final    Special Requests LEFT LOWER LEG   Final    Gram Stain     Final    Value: NO WBC SEEN     NO SQUAMOUS EPITHELIAL CELLS SEEN     ABUNDANT GRAM POSITIVE COCCI IN PAIRS     ABUNDANT GRAM NEGATIVE COCCOBACILLI   Culture ABUNDANT GRAM NEGATIVE RODS   Final    Report Status PENDING   Incomplete   CULTURE, BLOOD (ROUTINE X 2)     Status: Normal (Preliminary result)   Collection Time   08/27/11  1:15 AM      Component Value Range Status  Comment   Specimen Description BLOOD RIGHT ARM   Final    Special Requests BOTTLES DRAWN AEROBIC AND ANAEROBIC 5CC   Final    Culture  Setup Time 08/27/2011 09:03   Final    Culture     Final    Value:        BLOOD CULTURE RECEIVED NO GROWTH TO DATE CULTURE WILL BE HELD FOR 5 DAYS BEFORE ISSUING A FINAL NEGATIVE REPORT   Report Status PENDING   Incomplete   WOUND CULTURE     Status: Normal (Preliminary result)   Collection Time   08/27/11 11:12 AM      Component Value Range Status Comment   Specimen Description WOUND LEFT LEG   Final    Special Requests Immunocompromised   Final    Gram Stain     Final    Value: NO WBC SEEN     NO SQUAMOUS EPITHELIAL CELLS SEEN     FEW GRAM POSITIVE COCCI IN PAIRS     FEW GRAM NEGATIVE RODS   Culture ABUNDANT GRAM NEGATIVE RODS   Final    Report Status PENDING   Incomplete   MRSA PCR SCREENING     Status: Normal   Collection Time   08/28/11  9:43 AM      Component Value Range Status Comment   MRSA by PCR NEGATIVE  NEGATIVE Final      Studies/Results: Ct Abdomen Pelvis Wo Contrast  08/09/2011  *RADIOLOGY REPORT*  Clinical Data: Abdominal pain  CT ABDOMEN AND PELVIS WITHOUT CONTRAST  Technique:  Multidetector CT imaging of the abdomen and pelvis was performed following the standard protocol without intravenous contrast.  Comparison: 03/30/2009  Findings: Cardiomegaly.  Coronary artery calcification.  Aortic valve calcification.  Small pericardial effusion.  Organ abnormality/lesion detection is limited in the absence of intravenous contrast. Within this limitation, unremarkable liver, biliary system, spleen, pancreas, adrenal glands.  Atrophic native kidneys with cortical thinning and nonspecific calcifications.  Right lower quadrant renal transplant the previously described cystic lesion has decreased in size. Two lesions arising from the upper pole, one of which is hyperdense, the other which is hypodense, increased in size in the interval.  Hypodense cyst arising from the lower pole right kidney is incompletely characterized however similar to prior.  No hydronephrosis or hydroureter.  No bowel obstruction.  Colonic diverticulosis.  No free intraperitoneal air or fluid.  No lymphadenopathy.  There is scattered atherosclerotic calcification of the aorta and its branches. No aneurysmal dilatation.  Retroaortic left renal vein.  Thin-walled bladder.  Scarring along the anterior right subcutaneous fat.  Atrophy of the right rectus abdominus musculature.  Left femoral hardware/ hip arthroplasty results in streak artifact. Allowing for this, no definite acute osseous abnormality identified.  Multilevel degenerative changes.  Osteopenia. Multilevel vertebral body height loss.  IMPRESSION: Within limitations of a noncontrast examination, no definite acute abnormality.  Transplant kidney right lower quadrant.  No hydronephrosis. Multiple renal lesions are incompletely characterized.  Recommend non emergent ultrasound follow-up.  Original Report  Authenticated By: Waneta Martins, M.D.   Mr Tibia Fibula Left Wo Contrast  08/27/2011  *RADIOLOGY REPORT*  Clinical Data: Medial distal lower leg draining wound.  History of renal transplant, hepatitis C, chronic lower extremity lymphedema and cellulitis.  MRI OF THE LEFT LOWER LEG WITHOUT CONTRAST  Technique:  Multiplanar, multisequence MR imaging of the left lower leg was performed.  No intravenous contrast was administered.  Comparison:  Left knee radiographs 03/30/2009 and CT 03/31/2009.  Findings:  Study was performed using the body coil.  Both lower legs are included on the axial and coronal images.  Both lower legs are massively enlarged by extensive subcutaneous edema and ill- defined fluid.  The findings appear fairly symmetric.  The presumed wound involving the medial left lower leg appears best seen on coronal images 9-13. No focal underlying fluid collection, foreign body or soft tissue emphysema is seen in this region.  The musculature throughout both lower legs is diffusely atrophied with fatty replacement.  In addition to the fatty replacement, there is fairly diffuse muscular edema which may be slightly worse on the left.  No focal fluid collection is seen.  Central marrow heterogeneity is noted proximally in the left tibia, not well visualized on available prior studies.  This appears nonacute.  There is no generalized marrow edema or cortical destruction.  Asymmetric degenerative changes are partially imaged at the left knee.  There is focal transverse signal within the distal fibular diaphysis which may reflect an old fracture.  IMPRESSION:  1.  There is extensive fairly symmetric subcutaneous and muscular edema throughout both lower legs.  This may be related to the given history of chronic lymphedema or superimposed cellulitis.  The muscular edema is nonspecific and could indicate subacute denervation given the associated fatty replacement, although myofasciitis not excluded. 2.  No focal  fluid collection identified.  The reported left lower leg wound is fairly subtle and without adjacent focal fluid collection. 3.  No evidence of osteomyelitis. 4.  Marrow changes within the proximal tibia appeared nonacute. Suspected old fracture of the distal left fibula.  Plain film correlation recommended if there are any acute symptoms referable to this area.  Original Report Authenticated By: Gerrianne Scale, M.D.    Medications: Scheduled Meds:    . amiodarone  200 mg Oral Daily  . calcitRIOL  0.25 mcg Oral Daily  . carvedilol  3.125 mg Oral BID WC  . ceFEPime (MAXIPIME) IV  1 g Intravenous Q24H  . collagenase   Topical Daily  . famotidine  20 mg Oral BID  . furosemide  40 mg Oral BID  . heparin  5,000 Units Subcutaneous Q8H  . metronidazole  500 mg Intravenous Q8H  . pantoprazole  40 mg Oral BID AC  . potassium chloride  10 mEq Oral Daily  . simvastatin  40 mg Oral QPM  . sirolimus  1.5 mg Oral Daily  . sodium chloride  3 mL Intravenous Q12H  . tobramycin-dexamethasone  1 drop Left Eye Q6H  . vancomycin  1,500 mg Intravenous Q48H   Continuous Infusions:    Assessment/Plan: Principal Problem:  *Cellulitis of leg, left with ulcer  - MRI reviewed, no osteomyelitis, MRSA negative. - Cultures growing gram-negative rods, continue current antibiotic coverage until final sensitivities, hopefully by tomorrow - appreciate wound care and continue hydrotherapy and chemical debridement inpatient - Patient will benefit from wound care center outpatient followup  Active Problems:  Renal transplant disorder/ CK D: - Creatinine stable, at baseline, discussed with Dr. Eliott Nine, no acute renal issues, continue to closely monitor - Continue sirolimus   Cardiomyopathy with Atrial flutter: Heart rate controlled, no pulmonary edema, continue current management - Continue amiodarone, not on anticoagulation outpatient    Conjunctivitis, acute, left eye - On tobramycin eye drops.  DVT  Prophylaxis:  Code Status: Full  Disposition: Not medically ready, plan DC on 08/30/2011 after hydrotherapy and final cultures available   LOS: 2 days   RAI,RIPUDEEP M.D. [facility] 08/29/2011, 1:23 PM Pager: [phone]  If 7PM-7AM, please contact night-coverage [email or web address] Password [email or web address]

## 2011-08-29 NOTE — Progress Notes (Signed)
ANTIBIOTIC CONSULT NOTE - FOLLOW UP  Pharmacy Consult for Vancomycin and Cefepime Indication: LE cellulitis/ulcer  No Known Allergies  Patient Measurements: Height: 6\' 1"  (185.4 cm) Weight: 248 lb 14.4 oz (112.9 kg) IBW/kg (Calculated) : 79.9  Adjusted Body Weight:   Vital Signs: Temp: 98.5 F (36.9 C) (08/02 1000) Temp src: Oral (08/02 1000) BP: 165/78 mmHg (08/02 1000) Pulse Rate: 76  (08/02 1000) Intake/Output from previous day: 08/01 0701 - 08/02 0700 In: 1660 [P.O.:960; IV Piggyback:700] Out: 725 [Urine:725] Intake/Output from this shift: Total I/O In: 240 [P.O.:240] Out: -   Labs:  Basename 08/29/11 0545 08/27/11 0635 08/26/11 2225  WBC 8.6 9.3 13.0*  HGB 10.5* 9.8* 12.2*  PLT 184 192 224  LABCREA -- -- --  CREATININE 4.16* 4.24* 4.07*   Estimated Creatinine Clearance: 26.4 ml/min (by C-G formula based on Cr of 4.16). No results found for this basename: VANCOTROUGH:2,VANCOPEAK:2,VANCORANDOM:2,GENTTROUGH:2,GENTPEAK:2,GENTRANDOM:2,TOBRATROUGH:2,TOBRAPEAK:2,TOBRARND:2,AMIKACINPEAK:2,AMIKACINTROU:2,AMIKACIN:2, in the last 72 hours   Microbiology: Recent Results (from the past 720 hour(s))  CULTURE, BLOOD (ROUTINE X 2)     Status: Normal (Preliminary result)   Collection Time   08/27/11  1:01 AM      Component Value Range Status Comment   Specimen Description BLOOD LEFT ARM   Final    Special Requests BOTTLES DRAWN AEROBIC ONLY 6CC   Final    Culture  Setup Time 08/27/2011 09:03   Final    Culture     Final    Value:        BLOOD CULTURE RECEIVED NO GROWTH TO DATE CULTURE WILL BE HELD FOR 5 DAYS BEFORE ISSUING A FINAL NEGATIVE REPORT   Report Status PENDING   Incomplete   WOUND CULTURE     Status: Normal (Preliminary result)   Collection Time   08/27/11  1:12 AM      Component Value Range Status Comment   Specimen Description WOUND   Final    Special Requests LEFT LOWER LEG   Final    Gram Stain     Final    Value: NO WBC SEEN     NO SQUAMOUS EPITHELIAL  CELLS SEEN     ABUNDANT GRAM POSITIVE COCCI IN PAIRS     ABUNDANT GRAM NEGATIVE COCCOBACILLI   Culture ABUNDANT GRAM NEGATIVE RODS   Final    Report Status PENDING   Incomplete   CULTURE, BLOOD (ROUTINE X 2)     Status: Normal (Preliminary result)   Collection Time   08/27/11  1:15 AM      Component Value Range Status Comment   Specimen Description BLOOD RIGHT ARM   Final    Special Requests BOTTLES DRAWN AEROBIC AND ANAEROBIC 5CC   Final    Culture  Setup Time 08/27/2011 09:03   Final    Culture     Final    Value:        BLOOD CULTURE RECEIVED NO GROWTH TO DATE CULTURE WILL BE HELD FOR 5 DAYS BEFORE ISSUING A FINAL NEGATIVE REPORT   Report Status PENDING   Incomplete   WOUND CULTURE     Status: Normal (Preliminary result)   Collection Time   08/27/11 11:12 AM      Component Value Range Status Comment   Specimen Description WOUND LEFT LEG   Final    Special Requests Immunocompromised   Final    Gram Stain     Final    Value: NO WBC SEEN     NO SQUAMOUS EPITHELIAL CELLS SEEN  FEW GRAM POSITIVE COCCI IN PAIRS     FEW GRAM NEGATIVE RODS   Culture ABUNDANT GRAM NEGATIVE RODS   Final    Report Status PENDING   Incomplete   MRSA PCR SCREENING     Status: Normal   Collection Time   08/28/11  9:43 AM      Component Value Range Status Comment   MRSA by PCR NEGATIVE  NEGATIVE Final     Anti-infectives     Start     Dose/Rate Route Frequency Ordered Stop   08/29/11 0400   vancomycin (VANCOCIN) 1,500 mg in sodium chloride 0.9 % 500 mL IVPB        1,500 mg 250 mL/hr over 120 Minutes Intravenous Every 48 hours 08/27/11 0411     08/27/11 0800   ceFEPIme (MAXIPIME) 1 g in dextrose 5 % 50 mL IVPB        1 g 100 mL/hr over 30 Minutes Intravenous Every 24 hours 08/27/11 0411     08/27/11 0430   vancomycin (VANCOCIN) 2,000 mg in sodium chloride 0.9 % 500 mL IVPB        2,000 mg 250 mL/hr over 120 Minutes Intravenous  Once 08/27/11 0411 08/27/11 0736   08/27/11 0400   metroNIDAZOLE  (FLAGYL) IVPB 500 mg        500 mg 100 mL/hr over 60 Minutes Intravenous 3 times per day 08/27/11 0343     08/27/11 0115   vancomycin (VANCOCIN) IVPB 1000 mg/200 mL premix  Status:  Discontinued        1,000 mg 200 mL/hr over 60 Minutes Intravenous  Once 08/27/11 0111 08/27/11 0923   08/27/11 0115  piperacillin-tazobactam (ZOSYN) IVPB 3.375 g       3.375 g 12.5 mL/hr over 240 Minutes Intravenous  Once 08/27/11 0111 08/27/11 0618          Assessment: 56yom on Vancomycin, Cefepime and Flagyl Day 3 for LE cellulitis and non-healing ulcer. Patient has remained afebrile x 24hrs and WBC wnl. Wound cultures have reported abundant GNR - pending identification and sensitivities. Patient with h/o renal transplant/CKD - renal function has remained stable during admission (CrCl ~25 ml/imin).   Goal of Therapy:  Vancomycin trough ~15  Plan:  1. Continue Vancomycin 1.5g IV q48h - next dose due 8/4 AM. Will plan to check trough with next dose if continued 2. Continue Cefepime 1g IV q24h and Flagyl 500mg  IV q8h - doses appropriate for renal function 3. Follow-up renal function, cultures/sensitivities and narrowing of antibiotic regimen  Chad [name] [phone] 08/29/2011,11:22 AM

## 2011-08-29 NOTE — Progress Notes (Signed)
   CARE MANAGEMENT NOTE 08/29/2011  Patient:  Chad [name], Chad [name]   Account Number:  [id number]  Date Initiated:  08/28/2011  Documentation initiated by:  ROYAL,CHERYL  Subjective/Objective Assessment:   Referral for [facility] and wound center     Action/Plan:   Met with pt re [facility] , he request [facility]  Will set up Wound Care center.   Anticipated DC Date:  09/01/2011   Anticipated DC Plan:  HOME W HOME HEALTH SERVICES         [facility] Choice  HOME HEALTH   Choice offered to / List presented to:  [name]        HH arranged  HH-1 RN      [facility] agency  [facility]   Status of service:  In process, will continue to follow Medicare Important Message given?   (If response is "NO", the following Medicare IM given date fields will be blank) Date Medicare IM given:   Date Additional Medicare IM given:    Discharge Disposition:  HOME W HOME HEALTH SERVICES  Per UR Regulation:  Reviewed for med. necessity/level of care/duration of stay  If discussed at Long Length of Stay Meetings, dates discussed:    Comments:  08/29/2011  [address] RN, [place]  [phone] [facility]/ Hilda Lias referral for home RN visits for wound care. Wound care clinic appointment 09/10/2011 at 10:00 am  08/28/2011  [address] RN, [place]  [phone] Met with patient to discuss discharge planning. He was recently discharged to home with RN and PT visits from [facility] and chose them for home health services. He has not been to the wound center and does have a ride for appointments for late morning.  Wound Care Center:  [phone]  Harriett Sine Appointment 09/10/2011  10:00 am patient to arrive at 9:45 am   08/27/2011 Pt for [facility] when ready for d/c, will need hydrotherapy for next few days. Will setup Wound Center when closer to d/c time. Johny Shock RN MPH Case Manager [phone]

## 2011-08-29 NOTE — Progress Notes (Signed)
Hydrotherapy Note:   08/29/11 1914  Subjective Assessment  Subjective "I am doing pretty good today."  Patient and Family Stated Goals Heal wound  Date of Onset 08/20/11  Prior Treatments Dressing changes  Evaluation and Treatment  Evaluation and Treatment Procedures Explained to Patient/Family Yes  Evaluation and Treatment Procedures agreed to  Wound 06/05/11 Other (Comment) Leg Left;Lower open ulcer, black edges with milky drainage  Date First Assessed/Time First Assessed: 06/05/11 0657   Wound Type: Other (Comment)  Location: Leg  Location Orientation: Left;Lower  Wound Description (Comments): open ulcer, black edges with milky drainage  Present on Admission: Yes  Site / Wound Assessment Yellow;Black;Red;Painful  % Wound base Red or Granulating 5%  % Wound base Yellow 75%  % Wound base Black 15%  Peri-wound Assessment Edema;Erythema (blanchable);Induration  Margins Unattacted edges (unapproximated)  Closure None  Drainage Amount Moderate  Drainage Description Purulent;Green;Odor  Non-staged Wound Description Partial thickness  Treatment Hydrotherapy (Pulse lavage);Debridement (Selective);Packing (Saline gauze) (Santyl, NS W to D 4x4, dry 4x4, skin prep, tape)  Dressing Type Gauze (Comment) (Santyl, NS W to D 4x4, dry 4x4, skin prep, tape)  Dressing Changed New  Dressing Status Clean;Dry;Intact  Hydrotherapy  Pulsed Lavage with Suction (psi) 8 psi  Pulsed Lavage with Suction - Normal Saline Used 1000 mL  Pulsed Lavage Tip Tip with splash shield  Pulsed lavage therapy - wound location Left Posterior Calf  Selective Debridement  Selective Debridement - Location Left Posterior Calf  Selective Debridement - Tools Used Forceps;Scissors  Selective Debridement - Tissue Removed Yellow and black slough.  Expressed drainage from tunneling.  Wound Therapy - Assess/Plan/Recommendations  Wound Therapy - Clinical Statement Pt is a 56 y/o male admitted with left posterior calf non-healing  wound.  Agree that continued Hydrotherapy would facilitate wound healing and assist with d/c planning for wound care.  Wound Therapy - Functional Problem List Decreased skin integrity causing increased chance of infection.  Factors Delaying/Impairing Wound Healing Altered sensation;Infection - systemic/local;Immobility;Vascular compromise  Hydrotherapy Plan Debridement;Dressing change;Patient/family education;Pulsatile lavage with suction  Wound Therapy - Frequency 6X / week  Wound Therapy - Current Recommendations WOC nurse  Wound Therapy - Follow Up Recommendations Home health RN  Wound Plan Pulse lavage with small shield diverter tip, selective debridement, dressing change.  Wound Therapy Goals - Improve the function of patient's integumentary system by progressing the wound(s) through the phases of wound healing by:  Decrease Necrotic Tissue - Progress Progressing toward goal  Increase Granulation Tissue - Progress Progressing toward goal  Improve Drainage Characteristics - Progress Progressing toward goal  Goals/treatment plan/discharge plan were made with and agreed upon by patient/family Yes  Wound Therapy - Potential for Goals Good    Pain:  4/10 in left posterior calf.  Pt premedicated and repositioned.  08/29/2011 Cephus Shelling, PT, DPT [phone]

## 2011-08-30 LAB — CBC
HCT: 31.6 % — ABNORMAL LOW (ref 39.0–52.0)
MCHC: 33.2 g/dL (ref 30.0–36.0)
MCV: 84.7 fL (ref 78.0–100.0)
Platelets: 196 10*3/uL (ref 150–400)
RDW: 17 % — ABNORMAL HIGH (ref 11.5–15.5)
WBC: 9.1 10*3/uL (ref 4.0–10.5)

## 2011-08-30 LAB — BASIC METABOLIC PANEL
BUN: 43 mg/dL — ABNORMAL HIGH (ref 6–23)
CO2: 25 mEq/L (ref 19–32)
Calcium: 8.7 mg/dL (ref 8.4–10.5)
Creatinine, Ser: 4 mg/dL — ABNORMAL HIGH (ref 0.50–1.35)

## 2011-08-30 LAB — WOUND CULTURE

## 2011-08-30 MED ORDER — CIPROFLOXACIN HCL 500 MG PO TABS: 500.0000 mg | ORAL_TABLET | Freq: Every day | ORAL | Status: AC

## 2011-08-30 MED ORDER — COLLAGENASE 250 UNIT/GM EX OINT: TOPICAL_OINTMENT | Freq: Every day | CUTANEOUS | Status: AC

## 2011-08-30 MED ORDER — TOBRAMYCIN-DEXAMETHASONE 0.3-0.1 % OP SUSP: 1.0000 [drp] | Freq: Four times a day (QID) | OPHTHALMIC | Status: AC

## 2011-08-30 MED ORDER — CIPROFLOXACIN HCL 500 MG PO TABS
500.0000 mg | ORAL_TABLET | Freq: Every day | ORAL | Status: DC
  Administered 2011-08-30: 500 mg via ORAL
  Filled 2011-08-30 (×2): qty 1

## 2011-08-30 NOTE — Progress Notes (Signed)
Physical Therapy Wound Treatment Patient Details  Name: Chad [name] MRN: [id number] Date of Birth: May 05, 1955  Today's Date: 08/30/2011 Time: 10:00-10:20   Subjective  Subjective: Pt agreeable to therapy today with no new complaints Patient and Family Stated Goals: Heal Wound Date of Onset: 08/20/11 Prior Treatments: Dressing changes  Pain Score: Pain Score: 0-No pain  Wound Assessment  Wound 06/05/11 Other (Comment) Leg Left;Lower open ulcer, black edges with milky drainage (Active)  Site / Wound Assessment Yellow;Black;Red;Painful 08/30/2011 12:00 PM  % Wound base Red or Granulating 5% 08/30/2011 12:00 PM  % Wound base Yellow 75% 08/30/2011 12:00 PM  % Wound base Black 15% 08/30/2011 12:00 PM  Peri-wound Assessment Edema 08/30/2011 12:00 PM  Wound Length (cm) 3.5 cm 08/27/2011  1:48 PM  Wound Width (cm) 6.8 cm 08/27/2011  1:48 PM  Wound Depth (cm) 0.8 cm 08/27/2011  1:48 PM  Tunneling (cm) 0.8 cm in depth noted at location of 6 o'clock as well as 0.3 cm in depth noted at location of 12 o'clock today. 08/28/2011  8:57 AM  Margins Unattacted edges (unapproximated) 08/30/2011 12:00 PM  Closure None 08/30/2011 12:00 PM  Drainage Amount Moderate 08/30/2011 12:00 PM  Drainage Description Purulent;Green;Odor 08/30/2011 12:00 PM  Non-staged Wound Description Partial thickness 08/30/2011 12:00 PM  Treatment Hydrotherapy (Pulse lavage);Debridement (Selective);Packing (Saline gauze) 08/30/2011 12:00 PM  Dressing Type Gauze (Comment) 08/30/2011 12:00 PM  Dressing Changed New 08/30/2011 12:00 PM  Dressing Status Clean;Dry;Intact 08/30/2011 12:00 PM     Wound 07/09/11 Blister (Serous filled) Leg Left;Lower unfilled blister (Active)     Incision 06/20/11 Chest Right (Active)     Incision 06/27/11 N/A Other (Comment) (Active)   Hydrotherapy Pulsed lavage therapy - wound location: Left posterior calf Pulsed Lavage with Suction (psi): 8 psi Pulsed Lavage with Suction - Normal Saline Used: 1000 mL Pulsed Lavage Tip:  Tip with splash shield Selective Debridement Selective Debridement - Location: Left posterior calf Selective Debridement - Tools Used: Scissors;Forceps Selective Debridement - Tissue Removed: Yellow and black slough.   Wound Assessment and Plan  Wound Therapy - Assess/Plan/Recommendations Hydrotherapy Plan: Debridement;Dressing change;Patient/family education;Pulsatile lavage with suction Wound Therapy - Frequency: 6X / week Wound Therapy - Current Recommendations: WOC nurse Wound Therapy - Follow Up Recommendations: Home health RN Wound Plan: Pulse lavage with small shield diverter tip, selective debridement, dressing change  Wound Therapy Goals- Improve the function of patient's integumentary system by progressing the wound(s) through the phases of wound healing (inflammation - proliferation - remodeling) by: Decrease Necrotic Tissue - Progress: Progressing toward goal Increase Granulation Tissue - Progress: Progressing toward goal Improve Drainage Characteristics - Progress: Goal set today  Goals will be updated until maximal potential achieved or discharge criteria met.  Discharge criteria: when goals achieved, discharge from hospital, MD decision/surgical intervention, no progress towards goals, refusal/missing three consecutive treatments without notification or medical reason.   Sallyanne Kuster 08/30/2011, 12:41 PM  Sallyanne Kuster, PTA Office- [phone]

## 2011-08-30 NOTE — Discharge Summary (Signed)
Physician Discharge Summary  Patient ID: Chad [name] MRN: [id number] DOB/AGE: Jun 14, 1955 56 y.o.  Admit date: 08/27/2011 Discharge date: 08/30/2011  Primary Care Physician:  [name], MD  Discharge Diagnoses:    .Cellulitis of leg, left with stasis ulcer, wound culture positive for Proteus mirabilis, Serratia marcescens  .Cardiomyopathy .Renal transplant disorder .Atrial flutter .Ulcer of left lower leg .Conjunctivitis, acute, left eye  Consults:  Wound care  Discharge Medications: Medication List  As of 08/30/2011 12:33 PM   TAKE these medications         amiodarone 200 MG tablet   Commonly known as: PACERONE   Take 200 mg by mouth daily.      calcitRIOL 0.25 MCG capsule   Commonly known as: ROCALTROL   Take 0.25 mcg by mouth daily.      carvedilol 3.125 MG tablet   Commonly known as: COREG   Take 3.125 mg by mouth 2 (two) times daily with a meal.      ciprofloxacin 500 MG tablet   Commonly known as: CIPRO   Take 1 tablet (500 mg total) by mouth daily. For 14 days      collagenase ointment   Commonly known as: SANTYL   Apply topically daily. Apply topically daily with dressing change      famotidine 20 MG tablet   Commonly known as: PEPCID   Take 1 tablet (20 mg total) by mouth 2 (two) times daily.      furosemide 40 MG tablet   Commonly known as: LASIX   Take 40 mg by mouth 2 (two) times daily.      HYDROcodone-acetaminophen 5-325 MG per tablet   Commonly known as: NORCO/VICODIN   Take 1-2 tablets by mouth every 4 (four) hours as needed. For pain      pantoprazole 40 MG tablet   Commonly known as: PROTONIX   Take 40 mg by mouth 2 (two) times daily before a meal.      potassium chloride 10 MEQ tablet   Commonly known as: K-DUR,KLOR-CON   Take 10 mEq by mouth daily.      simvastatin 40 MG tablet   Commonly known as: ZOCOR   Take 40 mg by mouth every evening.      sirolimus 1 MG/ML solution   Commonly known as: RAPAMUNE   Take 1.5 mg by mouth  daily.      tobramycin-dexamethasone ophthalmic solution   Commonly known as: TOBRADEX   Place 1 drop into the left eye every 6 (six) hours. X 1 week             Brief H and P: For complete details please refer to admission H and P, but in brief patient is a 56 year old male with a renal transplant, hypertension, hepatitis C who was recently in the hospital 2 months ago and had undergone a complicated course with acute respiratory failure secondary to parainfluenza pneumonia requiring intubation, tracheostomy and had cardiomyopathy with atrial flutter. On the day of admission patient had presented to the urgent care complaining of an open wound to the posterior aspect of the left lower leg. Patient had noticed increasing tenderness, redness and odorous drainage with ulceration. Patient was being taken care by a home nurse. He described that he had an Ace wrap around that he and he felt it was too high it and initially caused him to have a blister that later brought into the wound. Patient was admitted for further evaluation.  Hospital Course:  Cellulitis of  leg, left with stasis ulcer: Patient was admitted for aggressive wound care and IV antibiotics. Patient underwent MRI of the left leg which showed extensive fairly symmetric subcutaneous and muscular edema throughout both lower legs. No focal fluid collection or abscess was identified and no evidence of osteomyelitis. Wound Care was consulted and patient also had wound cultures sent which grew out Serratia marcescens and Proteus mirabilis. Patient was placed on IV vancomycin, cefepime and Flagyl given his immunosuppression he is at high risk of superimposed bacterial infections. He received daily aggressive chemical debridement and hydrotherapy by wound care. The culture results and sensitivities were reviewed with ID (Dr Luciana Axe), and both Serratia and Proteus mirabilis are sensitive to ciprofloxacin and Bactrim. Given patient is a renal transplant  patient with the CKD, ciprofloxacin was felt to be more suitable and was renally dosed with pharmacy recommendations for 14 days. He will continue to have chemical debridement with sentinel and record daily dressing changes. Home health was arranged. Patient will have a follow up at the wound care center on 09/10/2011 at 10AM.   Renal transplant disorder/ CK D: Creatinine remained stable and at baseline patient was continued on immunosuppressant and he will followup with Dr. Caryn Section as an outpatient.  Cardiomyopathy with Atrial flutter: Heart rate controlled, no pulmonary edema, continue current management  - Continue amiodarone, not on anticoagulation outpatient  Conjunctivitis, acute, left eye  - On tobramycin eye drops.    Day of Discharge BP 135/71  Pulse 80  Temp 97.6 F (36.4 C) (Oral)  Resp 17  Ht 6\' 1"  (1.854 m)  Wt 112.9 kg (248 lb 14.4 oz)  BMI 32.84 kg/m2  SpO2 98%  Physical Exam: General: Alert and awake oriented x3 not in any acute distress. HEENT: anicteric sclera, pupils reactive to light and accommodation CVS: S1-S2 clear no murmur rubs or gallops Chest: clear to auscultation bilaterally, no wheezing rales or rhonchi Abdomen: soft nontender, nondistended, normal bowel sounds, no organomegaly Extremities: no cyanosis, clubbing, extensive lymphedema with left leg ulcer    The results of significant diagnostics from this hospitalization (including imaging, microbiology, ancillary and laboratory) are listed below for reference.    LAB RESULTS: Basic Metabolic Panel:  Lab 08/30/11 1610 08/29/11 0545  NA 135 137  K 3.6 3.7  CL 100 101  CO2 25 25  GLUCOSE 78 86  BUN 43* 48*  CREATININE 4.00* 4.16*  CALCIUM 8.7 8.7  MG -- --  PHOS -- --   Liver Function Tests:  Lab 08/27/11 0635  AST 17  ALT <5  ALKPHOS 56  BILITOT 0.3  PROT 6.9  ALBUMIN 2.5*    CBC:  Lab 08/30/11 0530 08/29/11 0545 08/27/11 0635  WBC 9.1 8.6 --  NEUTROABS -- -- 4.9  HGB 10.5*  10.5* --  HCT 31.6* 32.2* --  MCV 84.7 -- --  PLT 196 184 --   Significant Diagnostic Studies:  Mr Tibia Fibula Left Wo Contrast  08/27/2011   IMPRESSION:  1.  There is extensive fairly symmetric subcutaneous and muscular edema throughout both lower legs.  This may be related to the given history of chronic lymphedema or superimposed cellulitis.  The muscular edema is nonspecific and could indicate subacute denervation given the associated fatty replacement, although myofasciitis not excluded. 2.  No focal fluid collection identified.  The reported left lower leg wound is fairly subtle and without adjacent focal fluid collection. 3.  No evidence of osteomyelitis. 4.  Marrow changes within the proximal tibia appeared nonacute. Suspected  old fracture of the distal left fibula.  Plain film correlation recommended if there are any acute symptoms referable to this area.  Original Report Authenticated By: Gerrianne Scale, M.D.     Disposition and Follow-up: Discharge Orders    Future Orders Please Complete By Expires   Diet - low sodium heart healthy      Increase activity slowly      Discharge wound care:      Comments:   Physical therapy will apply to left posterior leg wound Q day after hydrotherapy. Cover with moist dressing and kerlex.       DISPOSITION: Home  DIET: Heart healthy ACTIVITY: As tolerated  DISCHARGE FOLLOW-UP Follow-up Information    Follow up with [name], MD. Schedule an appointment as soon as possible for a visit in 2 weeks. (for follow-up)    Contact information:   [address] [employer] [place] [address] [phone]       Follow up with [facility] on 09/10/2011. (at 10:00 AM, please arrive at 9:30 AM)          Time spent on Discharge: 45 minutes  Signed:   RAI,RIPUDEEP M.D. [facility] 08/30/2011, 12:33 PM Pager: [phone]  If 7PM-7AM, please contact  night-coverage [email or web address] Password [email or web address]

## 2011-09-01 LAB — WOUND CULTURE: Gram Stain: NONE SEEN

## 2011-09-02 LAB — CULTURE, BLOOD (ROUTINE X 2): Culture: NO GROWTH

## 2011-09-10 ENCOUNTER — Encounter (HOSPITAL_BASED_OUTPATIENT_CLINIC_OR_DEPARTMENT_OTHER): Payer: BC Managed Care – PPO | Attending: General Surgery

## 2011-09-10 DIAGNOSIS — Z940 Kidney transplant status: Secondary | ICD-10-CM | POA: Insufficient documentation

## 2011-09-10 DIAGNOSIS — L8990 Pressure ulcer of unspecified site, unspecified stage: Secondary | ICD-10-CM | POA: Insufficient documentation

## 2011-09-10 DIAGNOSIS — I10 Essential (primary) hypertension: Secondary | ICD-10-CM | POA: Insufficient documentation

## 2011-09-10 DIAGNOSIS — Z79899 Other long term (current) drug therapy: Secondary | ICD-10-CM | POA: Insufficient documentation

## 2011-09-10 DIAGNOSIS — L89899 Pressure ulcer of other site, unspecified stage: Secondary | ICD-10-CM | POA: Insufficient documentation

## 2011-09-10 DIAGNOSIS — I890 Lymphedema, not elsewhere classified: Secondary | ICD-10-CM | POA: Insufficient documentation

## 2011-09-10 DIAGNOSIS — Z8701 Personal history of pneumonia (recurrent): Secondary | ICD-10-CM | POA: Insufficient documentation

## 2011-09-10 DIAGNOSIS — I872 Venous insufficiency (chronic) (peripheral): Secondary | ICD-10-CM | POA: Insufficient documentation

## 2011-09-10 NOTE — Progress Notes (Signed)
[facility]  NAME:  Chad [name], Chad [name] NO.:  [id number]  MEDICAL RECORD NO.:  [id number]      DATE OF BIRTH:  01-26-1956  PHYSICIAN:  Ardath Sax, M.D.           VISIT DATE:                                  OFFICE VISIT   [name] is an unfortunate 56 year old who had a kidney transplant about 10 years ago.  He had severe hypertension.  He also had a history of hepatitis C, venous stasis and he is morbidly obese.  He is on many medicines including amiodarone, calcitriol, carvedilol.  Now he is on ciprofloxacin, Santyl for his wound, Lasix 40 mg twice a day, potassium chloride, simvastatin.  He was recently in the hospital with pneumonia for 2 months and he was on a respirator.  He was very ill and was diagnosed with acute respiratory failure.  He still looks very ill, but apparently while he was in the hospital he developed a severe pressure sore on the back of his left leg.  He has marked lymphedema and has for years along with his venous stasis.  I am going to recommend that he get the lymphedema pumps at home as I think we cannot rid of the edema, we will never get rid of these venous ulcers.  Today, I debrided the necrotic material down into the subcu and we will treat it with Santyl and silver alginate and have him come back in a week.  In the meantime, we will apply for lymphedema pumps and we put this man on Cipro 500 mg twice a day.  Apparently, he has been told that his creatinine is going up which is worrisome on his kidney transplant that is hoping will survive and not fail.  DIAGNOSES: 1. Morbid obesity. 2. Renal failure, postop status renal transplant. 3. Hypertension. 4. Marked lymphedema of both of his lower extremities. 5. Venous stasis. 6. Severe deep ulcer about 5 or 6 cm in diameter in the posterior     aspect of his left leg.     Ardath Sax, M.D.     PP/MEDQ  D:  09/10/2011  T:  09/10/2011  Job:  [id number]

## 2011-10-02 ENCOUNTER — Inpatient Hospital Stay (HOSPITAL_COMMUNITY): Payer: BC Managed Care – PPO

## 2011-10-02 ENCOUNTER — Inpatient Hospital Stay (HOSPITAL_COMMUNITY)
Admission: EM | Admit: 2011-10-02 | Discharge: 2011-10-05 | DRG: 544 | Disposition: A | Discharge status: Home / Self Care | Payer: BC Managed Care – PPO | Attending: Internal Medicine | Admitting: Internal Medicine

## 2011-10-02 ENCOUNTER — Encounter (HOSPITAL_COMMUNITY): Payer: Self-pay | Admitting: Emergency Medicine

## 2011-10-02 ENCOUNTER — Emergency Department (HOSPITAL_COMMUNITY): Payer: BC Managed Care – PPO

## 2011-10-02 DIAGNOSIS — R0902 Hypoxemia: Secondary | ICD-10-CM

## 2011-10-02 DIAGNOSIS — I129 Hypertensive chronic kidney disease with stage 1 through stage 4 chronic kidney disease, or unspecified chronic kidney disease: Secondary | ICD-10-CM | POA: Diagnosis present

## 2011-10-02 DIAGNOSIS — I4891 Unspecified atrial fibrillation: Secondary | ICD-10-CM | POA: Diagnosis present

## 2011-10-02 DIAGNOSIS — I509 Heart failure, unspecified: Secondary | ICD-10-CM | POA: Diagnosis present

## 2011-10-02 DIAGNOSIS — H1032 Unspecified acute conjunctivitis, left eye: Secondary | ICD-10-CM

## 2011-10-02 DIAGNOSIS — J80 Acute respiratory distress syndrome: Secondary | ICD-10-CM

## 2011-10-02 DIAGNOSIS — Z79899 Other long term (current) drug therapy: Secondary | ICD-10-CM

## 2011-10-02 DIAGNOSIS — I252 Old myocardial infarction: Secondary | ICD-10-CM

## 2011-10-02 DIAGNOSIS — I428 Other cardiomyopathies: Secondary | ICD-10-CM | POA: Diagnosis present

## 2011-10-02 DIAGNOSIS — T8610 Unspecified complication of kidney transplant: Secondary | ICD-10-CM

## 2011-10-02 DIAGNOSIS — M549 Dorsalgia, unspecified: Secondary | ICD-10-CM

## 2011-10-02 DIAGNOSIS — R6521 Severe sepsis with septic shock: Secondary | ICD-10-CM

## 2011-10-02 DIAGNOSIS — N179 Acute kidney failure, unspecified: Secondary | ICD-10-CM

## 2011-10-02 DIAGNOSIS — I5023 Acute on chronic systolic (congestive) heart failure: Principal | ICD-10-CM

## 2011-10-02 DIAGNOSIS — Z940 Kidney transplant status: Secondary | ICD-10-CM

## 2011-10-02 DIAGNOSIS — J189 Pneumonia, unspecified organism: Secondary | ICD-10-CM

## 2011-10-02 DIAGNOSIS — N189 Chronic kidney disease, unspecified: Secondary | ICD-10-CM | POA: Diagnosis present

## 2011-10-02 DIAGNOSIS — E785 Hyperlipidemia, unspecified: Secondary | ICD-10-CM | POA: Diagnosis present

## 2011-10-02 DIAGNOSIS — D631 Anemia in chronic kidney disease: Secondary | ICD-10-CM | POA: Diagnosis present

## 2011-10-02 DIAGNOSIS — D72829 Elevated white blood cell count, unspecified: Secondary | ICD-10-CM | POA: Diagnosis present

## 2011-10-02 DIAGNOSIS — L97929 Non-pressure chronic ulcer of unspecified part of left lower leg with unspecified severity: Secondary | ICD-10-CM

## 2011-10-02 DIAGNOSIS — D696 Thrombocytopenia, unspecified: Secondary | ICD-10-CM

## 2011-10-02 DIAGNOSIS — I5021 Acute systolic (congestive) heart failure: Secondary | ICD-10-CM

## 2011-10-02 DIAGNOSIS — A419 Sepsis, unspecified organism: Secondary | ICD-10-CM

## 2011-10-02 DIAGNOSIS — I2789 Other specified pulmonary heart diseases: Secondary | ICD-10-CM | POA: Diagnosis present

## 2011-10-02 DIAGNOSIS — Z8619 Personal history of other infectious and parasitic diseases: Secondary | ICD-10-CM

## 2011-10-02 DIAGNOSIS — G7281 Critical illness myopathy: Secondary | ICD-10-CM

## 2011-10-02 DIAGNOSIS — I890 Lymphedema, not elsewhere classified: Secondary | ICD-10-CM | POA: Diagnosis present

## 2011-10-02 DIAGNOSIS — J9600 Acute respiratory failure, unspecified whether with hypoxia or hypercapnia: Secondary | ICD-10-CM

## 2011-10-02 DIAGNOSIS — I429 Cardiomyopathy, unspecified: Secondary | ICD-10-CM

## 2011-10-02 DIAGNOSIS — I4892 Unspecified atrial flutter: Secondary | ICD-10-CM

## 2011-10-02 DIAGNOSIS — R809 Proteinuria, unspecified: Secondary | ICD-10-CM | POA: Diagnosis present

## 2011-10-02 DIAGNOSIS — L03116 Cellulitis of left lower limb: Secondary | ICD-10-CM

## 2011-10-02 HISTORY — DX: Unspecified atrial flutter: I48.92

## 2011-10-02 HISTORY — DX: Unspecified systolic (congestive) heart failure: I50.20

## 2011-10-02 HISTORY — DX: Unspecified viral hepatitis C without hepatic coma: B19.20

## 2011-10-02 HISTORY — DX: Hyperlipidemia, unspecified: E78.5

## 2011-10-02 LAB — CBC WITH DIFFERENTIAL/PLATELET
Basophils Absolute: 0.1 10*3/uL (ref 0.0–0.1)
Basophils Relative: 0 % (ref 0–1)
Eosinophils Absolute: 0 10*3/uL (ref 0.0–0.7)
Eosinophils Relative: 0 % (ref 0–5)
Lymphs Abs: 1.6 10*3/uL (ref 0.7–4.0)
MCH: 26.5 pg (ref 26.0–34.0)
MCHC: 32.6 g/dL (ref 30.0–36.0)
MCV: 81.3 fL (ref 78.0–100.0)
Neutrophils Relative %: 81 % — ABNORMAL HIGH (ref 43–77)
Platelets: 197 10*3/uL (ref 150–400)
RBC: 3.85 MIL/uL — ABNORMAL LOW (ref 4.22–5.81)
RDW: 18.5 % — ABNORMAL HIGH (ref 11.5–15.5)

## 2011-10-02 LAB — URINALYSIS, ROUTINE W REFLEX MICROSCOPIC
Bilirubin Urine: NEGATIVE
Ketones, ur: NEGATIVE mg/dL
Leukocytes, UA: NEGATIVE
Nitrite: NEGATIVE
Urobilinogen, UA: 0.2 mg/dL (ref 0.0–1.0)

## 2011-10-02 LAB — COMPREHENSIVE METABOLIC PANEL
ALT: 14 U/L (ref 0–53)
AST: 43 U/L — ABNORMAL HIGH (ref 0–37)
Albumin: 3.1 g/dL — ABNORMAL LOW (ref 3.5–5.2)
Alkaline Phosphatase: 52 U/L (ref 39–117)
Calcium: 9.3 mg/dL (ref 8.4–10.5)
GFR calc Af Amer: 26 mL/min — ABNORMAL LOW (ref >90)
Glucose, Bld: 111 mg/dL — ABNORMAL HIGH (ref 70–99)
Potassium: 3.7 mEq/L (ref 3.5–5.1)
Sodium: 130 mEq/L — ABNORMAL LOW (ref 135–145)
Total Protein: 8.2 g/dL (ref 6.0–8.3)

## 2011-10-02 LAB — URINE MICROSCOPIC-ADD ON

## 2011-10-02 LAB — PRO B NATRIURETIC PEPTIDE: Pro B Natriuretic peptide (BNP): >70000 pg/mL — ABNORMAL HIGH (ref 0–125)

## 2011-10-02 MED ORDER — SODIUM CHLORIDE 0.9 % IJ SOLN: 3.0000 mL | INTRAMUSCULAR | Status: DC | PRN

## 2011-10-02 MED ORDER — SIMVASTATIN 40 MG PO TABS
40.0000 mg | ORAL_TABLET | Freq: Every evening | ORAL | Status: DC
  Administered 2011-10-03 – 2011-10-04 (×2): 40 mg via ORAL
  Filled 2011-10-02 (×3): qty 1

## 2011-10-02 MED ORDER — DIPHENHYDRAMINE HCL 25 MG PO CAPS
25.0000 mg | ORAL_CAPSULE | Freq: Four times a day (QID) | ORAL | Status: DC | PRN
  Administered 2011-10-03: 25 mg via ORAL
  Filled 2011-10-02: qty 1

## 2011-10-02 MED ORDER — DARBEPOETIN ALFA-POLYSORBATE 60 MCG/0.3ML IJ SOLN
60.0000 ug | INTRAMUSCULAR | Status: DC
  Administered 2011-10-03: 60 ug via SUBCUTANEOUS
  Filled 2011-10-02 (×2): qty 0.3

## 2011-10-02 MED ORDER — ACETAMINOPHEN 325 MG PO TABS: 650.0000 mg | ORAL_TABLET | ORAL | Status: DC | PRN

## 2011-10-02 MED ORDER — PANTOPRAZOLE SODIUM 40 MG PO TBEC
40.0000 mg | DELAYED_RELEASE_TABLET | Freq: Two times a day (BID) | ORAL | Status: DC
  Administered 2011-10-03 – 2011-10-05 (×5): 40 mg via ORAL
  Filled 2011-10-02 (×6): qty 1

## 2011-10-02 MED ORDER — METHOCARBAMOL 500 MG PO TABS: 500.0000 mg | ORAL_TABLET | Freq: Two times a day (BID) | ORAL | Status: AC

## 2011-10-02 MED ORDER — SIROLIMUS 1 MG PO TABS
1.5000 mg | ORAL_TABLET | Freq: Every day | ORAL | Status: DC
  Filled 2011-10-02: qty 2

## 2011-10-02 MED ORDER — VANCOMYCIN HCL 1000 MG IV SOLR
2000.0000 mg | INTRAVENOUS | Status: AC
  Administered 2011-10-02: 2000 mg via INTRAVENOUS
  Filled 2011-10-02: qty 2000

## 2011-10-02 MED ORDER — SODIUM CHLORIDE 0.9 % IV SOLN
1500.0000 mg | INTRAVENOUS | Status: DC
  Administered 2011-10-04: 1500 mg via INTRAVENOUS
  Filled 2011-10-02: qty 1500

## 2011-10-02 MED ORDER — AMIODARONE HCL 200 MG PO TABS
200.0000 mg | ORAL_TABLET | Freq: Every day | ORAL | Status: DC
  Administered 2011-10-03 – 2011-10-05 (×3): 200 mg via ORAL
  Filled 2011-10-02 (×3): qty 1

## 2011-10-02 MED ORDER — CARVEDILOL 3.125 MG PO TABS
3.1250 mg | ORAL_TABLET | Freq: Two times a day (BID) | ORAL | Status: DC
  Administered 2011-10-03: 3.125 mg via ORAL
  Filled 2011-10-02 (×4): qty 1

## 2011-10-02 MED ORDER — BIOTENE DRY MOUTH MT LIQD
15.0000 mL | Freq: Two times a day (BID) | OROMUCOSAL | Status: DC
  Administered 2011-10-02 – 2011-10-05 (×6): 15 mL via OROMUCOSAL

## 2011-10-02 MED ORDER — POTASSIUM CHLORIDE CRYS ER 20 MEQ PO TBCR
20.0000 meq | EXTENDED_RELEASE_TABLET | Freq: Every day | ORAL | Status: DC
  Administered 2011-10-03 – 2011-10-05 (×3): 20 meq via ORAL
  Filled 2011-10-02 (×3): qty 1

## 2011-10-02 MED ORDER — LEVOFLOXACIN 750 MG PO TABS
750.0000 mg | ORAL_TABLET | ORAL | Status: DC
  Administered 2011-10-02: 750 mg via ORAL
  Filled 2011-10-02: qty 1

## 2011-10-02 MED ORDER — FUROSEMIDE 10 MG/ML IJ SOLN
80.0000 mg | Freq: Two times a day (BID) | INTRAMUSCULAR | Status: DC
  Administered 2011-10-03 – 2011-10-04 (×3): 80 mg via INTRAVENOUS
  Filled 2011-10-02 (×5): qty 8

## 2011-10-02 MED ORDER — SODIUM CHLORIDE 0.9 % IJ SOLN
3.0000 mL | Freq: Two times a day (BID) | INTRAMUSCULAR | Status: DC
  Administered 2011-10-03 – 2011-10-05 (×6): 3 mL via INTRAVENOUS

## 2011-10-02 MED ORDER — VALACYCLOVIR HCL 1 G PO TABS: 1000.0000 mg | ORAL_TABLET | Freq: Three times a day (TID) | ORAL | Status: AC

## 2011-10-02 MED ORDER — FUROSEMIDE 10 MG/ML IJ SOLN
80.0000 mg | Freq: Once | INTRAMUSCULAR | Status: AC
  Administered 2011-10-03: 80 mg via INTRAVENOUS
  Filled 2011-10-02: qty 8

## 2011-10-02 MED ORDER — SODIUM CHLORIDE 0.9 % IV SOLN
INTRAVENOUS | Status: DC
  Administered 2011-10-02 (×3): via INTRAVENOUS

## 2011-10-02 MED ORDER — SODIUM CHLORIDE 0.9 % IV SOLN: 250.0000 mL | INTRAVENOUS | Status: DC | PRN

## 2011-10-02 MED ORDER — OXYCODONE-ACETAMINOPHEN 5-325 MG PO TABS: 2.0000 | ORAL_TABLET | ORAL | Status: AC | PRN

## 2011-10-02 MED ORDER — CALCITRIOL 0.25 MCG PO CAPS
0.2500 ug | ORAL_CAPSULE | Freq: Every day | ORAL | Status: DC
  Administered 2011-10-03 – 2011-10-05 (×3): 0.25 ug via ORAL
  Filled 2011-10-02 (×3): qty 1

## 2011-10-02 MED ORDER — CARVEDILOL 3.125 MG PO TABS: 3.1250 mg | ORAL_TABLET | Freq: Two times a day (BID) | ORAL | Status: DC

## 2011-10-02 MED ORDER — HEPARIN SODIUM (PORCINE) 5000 UNIT/ML IJ SOLN
5000.0000 [IU] | Freq: Three times a day (TID) | INTRAMUSCULAR | Status: DC
  Administered 2011-10-03 – 2011-10-05 (×7): 5000 [IU] via SUBCUTANEOUS
  Filled 2011-10-02 (×10): qty 1

## 2011-10-02 MED ORDER — ONDANSETRON HCL 4 MG/2ML IJ SOLN: 4.0000 mg | Freq: Four times a day (QID) | INTRAMUSCULAR | Status: DC | PRN

## 2011-10-02 MED ORDER — FAMOTIDINE 20 MG PO TABS
20.0000 mg | ORAL_TABLET | Freq: Two times a day (BID) | ORAL | Status: DC
  Administered 2011-10-03 – 2011-10-04 (×4): 20 mg via ORAL
  Filled 2011-10-02 (×5): qty 1

## 2011-10-02 MED ORDER — PREDNISONE 10 MG PO TABS
10.0000 mg | ORAL_TABLET | Freq: Every day | ORAL | Status: DC
  Administered 2011-10-03 – 2011-10-05 (×3): 10 mg via ORAL
  Filled 2011-10-02 (×4): qty 1

## 2011-10-02 MED ORDER — HYDROCODONE-ACETAMINOPHEN 5-325 MG PO TABS
1.0000 | ORAL_TABLET | ORAL | Status: DC | PRN
  Administered 2011-10-02: 2 via ORAL
  Administered 2011-10-03: 1 via ORAL
  Administered 2011-10-04: 2 via ORAL
  Filled 2011-10-02 (×2): qty 2
  Filled 2011-10-02: qty 1

## 2011-10-02 NOTE — H&P (Signed)
[name] History and Physical  Chad [name] JWJ:[id number] DOB: 1955/06/15 DOA: 10/02/2011   PCP: [name], MD   Chief Complaint: Left thoracic back pain  HPI:  Pleasant 56 year old male presents with a two-week history of left upper back pain. He denies any recent trauma. He does complain of a dry cough for the past 2-3 weeks. He denies any hemoptysis, chest pressure, shortness of breath, dizziness, headache, visual changes. He denies any worsening leg edema, increasing abdominal girth, orthopnea, PND. The patient does have chronic lower extremity edema for which he goes to the wound care clinic for Unna boots.  During his evaluation in the emergency department the patient was found to have an oxygen saturation of 82% on room. This resulted in his admission. Chest x-ray showed pulmonary edema with questionable scattered patchy infiltrates. The patient states that he has been out of his cortical past 2 days, but he has been compliant with all his other medications including his furosemide at home. He denies any fevers, chills, headache, dysuria, hematuria, synovitis, sore throat, rashes, hemoptysis. Assessment/Plan: Acute systolic CHF -Echocardiogram on 06/25/2011 showed ejection fraction 25-30% -He was thought at the time but this may have been due to stunned myocardium from his ARDS and sepsis -Start diuresis, strict I.'s and O.'s, daily weights, repeat echocardiogram -Check serial troponins, EKG Hypertension -Poorly controlled. -Out of Coreg for the past 2 days. This may have resulted in his CHF decompensation Leukocytosis -Given the patient's renal transplant and has patchy infiltrates, cannot rule out pneumonia -Blood cultures x2, start vancomycin, Zosyn Upper back pain -Order left rib series x-rays -No visible rash on examination Status post renal transplant -Patient takes sirolimus History of atrial fibrillation/atrial flutter -Currently in sinus rhythm -Continue  amiodarone       Past Medical History  Diagnosis Date  . Hypertension   . H/O kidney transplant   . Hepatitis   . Chronic kidney disease   . Pneumonia    Past Surgical History  Procedure Date  . Nephrectomy transplanted organ   . Insertion of dialysis catheter 06/20/2011    Procedure: INSERTION OF DIALYSIS CATHETER;  Surgeon: [name], MD;  Location: [facility] OR;  Service: Vascular;  Laterality: Right;  Ultrasound guided insertion of right internal jugular dialysis catheter  . Multiple extractions with alveoloplasty 06/27/2011    Procedure: MULTIPLE EXTRACION WITH ALVEOLOPLASTY;  Surgeon: [name], DDS;  Location: [facility] OR;  Service: Oral Surgery;  Laterality: N/A;  Extraction  of tooth # 14 with alveoloplasty  . Hip surgery    Social History:  reports that he has never smoked. He does not have any smokeless tobacco history on file. He reports that he does not drink alcohol or use illicit drugs.  No Known Allergies  No family history on file.  Prior to Admission medications   Medication Sig Start Date End Date Taking? Authorizing Provider  amiodarone (PACERONE) 200 MG tablet Take 200 mg by mouth daily. 07/16/11 07/15/12 Yes Daniel J Angiulli, PA  calcitRIOL (ROCALTROL) 0.25 MCG capsule Take 0.25 mcg by mouth daily. 07/16/11 07/15/12 Yes Mcarthur Rossetti Angiulli, PA  carvedilol (COREG) 3.125 MG tablet Take 3.125 mg by mouth 2 (two) times daily with a meal. 07/16/11 07/15/12 Yes Daniel J Angiulli, PA  famotidine (PEPCID) 20 MG tablet Take 1 tablet (20 mg total) by mouth 2 (two) times daily. 08/09/11 08/08/12 Yes Sunnie Nielsen, MD  furosemide (LASIX) 40 MG tablet Take 40 mg by mouth 2 (two) times daily. 07/16/11 07/15/12 Yes  Mcarthur Rossetti Angiulli, PA  HYDROcodone-acetaminophen (NORCO) 5-325 MG per tablet Take 1-2 tablets by mouth every 4 (four) hours as needed. For pain   Yes Historical Provider, MD  pantoprazole (PROTONIX) 40 MG tablet Take 40 mg by mouth 2 (two) times daily before a meal. 07/16/11  07/15/12 Yes Daniel J Angiulli, PA  potassium chloride (K-DUR,KLOR-CON) 10 MEQ tablet Take 10 mEq by mouth daily. 07/16/11 07/15/12 Yes Daniel J Angiulli, PA  predniSONE (DELTASONE) 10 MG tablet Take 10 mg by mouth daily.   Yes Historical Provider, MD  simvastatin (ZOCOR) 40 MG tablet Take 40 mg by mouth every evening.   Yes Historical Provider, MD  sirolimus (RAPAMUNE) 1 MG/ML solution Take 1.5 mg by mouth daily. 07/16/11 07/15/12 Yes Daniel J Angiulli, PA  methocarbamol (ROBAXIN) 500 MG tablet Take 1 tablet (500 mg total) by mouth 2 (two) times daily. 10/02/11 10/12/11  [name], MD  oxyCODONE-acetaminophen (PERCOCET/ROXICET) 5-325 MG per tablet Take 2 tablets by mouth every 4 (four) hours as needed for pain. 10/02/11 10/12/11  [name], MD  valACYclovir (VALTREX) 1000 MG tablet Take 1 tablet (1,000 mg total) by mouth 3 (three) times daily. 10/02/11 10/16/11  [name], MD    Review of Systems:  Constitutional:  No weight loss, night sweats, Fevers, chills, fatigue.  Head&Eyes: No headache.  No vision loss.  No eye pain or scotoma ENT:  No Difficulty swallowing,Tooth/dental problems,Sore throat,  No ear ache, post nasal drip,  Cardio-vascular:  No chest pain, Orthopnea, PND, swelling in lower extremities,  dizziness, palpitations  GI:  No heartburn, indigestion, abdominal pain, nausea, vomiting, diarrhea,loss of appetite, hematochezia, melena Resp:  No shortness of breath with exertion or at rest. No excess mucus, no productive cough, No non-productive cough, No coughing up of blood.No change in color of mucus.No wheezing.No chest wall deformity  Skin:  no rash or lesions.  GU:  no dysuria, change in color of urine, no urgency or frequency. No flank pain.  Musculoskeletal:  Positive upper back pain Psych:  No change in mood or affect. No depression or anxiety. Neurologic: No headache, no dysesthesia, no focal weakness, no vision loss. No syncope  Physical Exam: Filed  Vitals:   10/02/11 1221 10/02/11 1423 10/02/11 1730 10/02/11 1742  BP: 161/103 166/102 170/107 167/107  Pulse: 99 90 89 93  Temp: 99.8 F (37.7 C)     Resp: 18 18 18 15   SpO2: 98% 98% 96% 98%   General:  A&O x 3, NAD, nontoxic, pleasant/cooperative Head/Eye: No conjunctival hemorrhage, no icterus, Donaldson/AT, No nystagmus ENT:  No icterus,  No thrush, good dentition, no pharyngeal exudate Neck:  No masses, , no bruits, positive shotty anterior cervical lymphadenopathy CV:  RRR, no rub, no gallop, no S3 Lung: bibasilar crackles left greater than right, good air movement, no wheeze, no rhonchi Abdomen: soft/NT, +BS, nondistended, no peritoneal signs Ext: No cyanosis, No rashes, No petechiae, No lymphangitis, 2+ edema-lower legs below knees are covered with Unna boot, feet and toes without any open wounds or erythema; upper back without any visible rash Neuro: CNII-XII intact, strength 4/5 in bilateral upper and lower extremities, no dysmetria  Labs on Admission:  Basic Metabolic Panel:  Lab 10/02/11 1610  NA 130*  K 3.7  CL 94*  CO2 21  GLUCOSE 111*  BUN 49*  CREATININE 2.95*  CALCIUM 9.3  MG --  PHOS --   Liver Function Tests:  Lab 10/02/11 1328  AST 43*  ALT 14  ALKPHOS 52  BILITOT 0.8  PROT 8.2  ALBUMIN 3.1*   No results found for this basename: LIPASE:5,AMYLASE:5 in the last 168 hours No results found for this basename: AMMONIA:5 in the last 168 hours CBC:  Lab 10/02/11 1328  WBC 18.0*  NEUTROABS 14.6*  HGB 10.2*  HCT 31.3*  MCV 81.3  PLT 197   Cardiac Enzymes:  Lab 10/02/11 1730  CKTOTAL --  CKMB --  CKMBINDEX --  TROPONINI <0.30   BNP: No components found with this basename: POCBNP:5 CBG: No results found for this basename: GLUCAP:5 in the last 168 hours  Radiological Exams on Admission: Dg Chest 2 View  10/02/2011  *RADIOLOGY REPORT*  Clinical Data: Chest pain, rib pain, shortness of breath  CHEST - 2 VIEW  Comparison: 06/22/2011  Findings:  Cardiomegaly noted with increased vascular and interstitial prominence concerning for early edema.  No significant effusion.  No pneumothorax.  Rib deformities on the right from prior fracture.  Trachea midline.  IMPRESSION: Cardiomegaly with early interstitial edema.   Original Report Authenticated By: Judie Petit. [name], M.D.     EKG: Independently reviewed. Sinus rhythm, heart rate 93, no ST-T wave changes    Time spend:70 minutes Code Status: full    Aarin Sparkman, DO  [name] Pager [phone]  If 7PM-7AM, please contact night-coverage [email or web address] Password [facility] 10/02/2011, 6:31 PM

## 2011-10-02 NOTE — Consult Note (Signed)
[facility] Renal Consultation Note  Requesting MD: Dr Tat Indication for Consultation: Status post renal transplantation and also CKD  HPI: Chad [name] is a 56 y.o. male with an extremely complicated past medical history. He is followed by Dr. Caryn Section at [facility]. 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 He developed some chronic allograft nephropathy and it had a creatinine in the high ones when he was hospitalized this summer for pneumonia and sepsis syndrome which was very severe. He spent a lot of time in the intensive care unit and required CVVH. He fortunately did recover from all this after spending some time in rehabilitation but still has not returned to his baseline level of function. Patient is also status post a hospitalization in early August when he had significant cellulitis of lower extremities and that's currently being followed at the wound Center. He thinks he is taking Cipro for his wounds. Today he presents to the emergency department with some back discomfort. It was felt to be either musculoskeletal or possibly the beginnings of shingles and plans were asked to be made to discharge Chad [name] from the emergency department. However, upon further review was found that he was hypoxic on room air and chest x-ray shows edema and labs show an elevated white blood count 18,000. Interestingly, his creatinine is actually a little but better than it's been lately at 2.95  been running in the fours.  Patient states that at the wound center they were telling him that his wounds were improved. He was due to go there tomorrow for a dressing change. 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  Creatinine, Ser  Date/Time Value Range Status  10/02/2011  1:28 PM 2.95* 0.50 - 1.35 mg/dL Final  05/29/8411  2:44 AM 4.00* 0.50 - 1.35 mg/dL Final  0/01/270  5:36  AM 4.16* 0.50 - 1.35 mg/dL Final  6/44/0347  4:25 AM 4.24* 0.50 - 1.35 mg/dL Final  9/56/3875 64:33 PM 4.07* 0.50 - 1.35 mg/dL Final  2/95/1884  1:66 PM 4.70* 0.50 - 1.35 mg/dL Final  0/63/0160  1:09 PM 4.76* 0.50 - 1.35 mg/dL Final  04/19/5571  2:20 AM 4.49* 0.50 - 1.35 mg/dL Final  2/54/2706  2:37 AM 4.32* 0.50 - 1.35 mg/dL Final  07/24/3149  7:61 AM 4.49* 0.50 - 1.35 mg/dL Final  07/03/3708  6:26 AM 4.67* 0.50 - 1.35 mg/dL Final  9/48/5462  7:03 AM 4.54* 0.50 - 1.35 mg/dL Final  5/00/9381  8:29 AM 4.72* 0.50 - 1.35 mg/dL Final  9/37/1696  7:89 AM 4.61* 0.50 - 1.35 mg/dL Final  3/81/0175  1:02 AM 4.83* 0.50 - 1.35 mg/dL Final  5/85/2778  2:42 AM 4.58* 0.50 - 1.35 mg/dL Final  3/53/6144  3:15 AM 4.83* 0.50 - 1.35 mg/dL Final  4/0/0867  6:19 AM 5.08* 0.50 - 1.35 mg/dL Final  5/0/9326  7:12 AM 4.99* 0.50 - 1.35 mg/dL Final  05/01/8097  8:33 AM 4.87* 0.50 - 1.35 mg/dL Final  08/29/5051  9:76 AM 4.96* 0.50 - 1.35 mg/dL Final  07/30/4191  7:90 AM 4.72* 0.50 - 1.35 mg/dL Final  03/03/971  5:32 AM 4.53* 0.50 - 1.35 mg/dL Final  10/05/2424  8:34 AM 4.16* 0.50 - 1.35 mg/dL Final  02/05/6220  9:79 AM 3.94* 0.50 - 1.35 mg/dL Final  8/92/1194  1:74 AM 2.86* 0.50 - 1.35 mg/dL Final  DELTA CHECK NOTED  06/26/2011  4:20 AM 4.83* 0.50 - 1.35 mg/dL Final  4/54/0981  1:91 AM 4.69* 0.50 - 1.35 mg/dL Final     DELTA CHECK NOTED  06/24/2011  4:30 AM 4.11* 0.50 - 1.35 mg/dL Final  4/78/2956  2:13 AM 2.89* 0.50 - 1.35 mg/dL Final     DELTA CHECK NOTED  06/22/2011  4:11 AM 1.29  0.50 - 1.35 mg/dL Final  0/86/5784  6:96 AM 1.45* 0.50 - 1.35 mg/dL Final  2/95/2841  3:24 PM 1.57* 0.50 - 1.35 mg/dL Final  04/28/270  5:36 AM 1.45* 0.50 - 1.35 mg/dL Final  6/44/0347  4:25 PM 1.57* 0.50 - 1.35 mg/dL Final  9/56/3875  6:43 AM 1.58* 0.50 - 1.35 mg/dL Final  04/25/5186  4:16 PM 1.65* 0.50 - 1.35 mg/dL Final  07/03/3014  0:10 AM 1.58* 0.50 - 1.35 mg/dL Final  9/32/3557  3:22 PM 1.50* 0.50 - 1.35 mg/dL Final  0/25/4270  6:23  AM 1.46* 0.50 - 1.35 mg/dL Final  7/62/8315  1:76 PM 1.41* 0.50 - 1.35 mg/dL Final  1/60/7371  0:62 AM 1.45* 0.50 - 1.35 mg/dL Final  6/94/8546 27:03 PM 1.51* 0.50 - 1.35 mg/dL Final  5/00/9381  8:29 PM 1.55* 0.50 - 1.35 mg/dL Final  9/37/1696  7:89 AM 1.50* 0.50 - 1.35 mg/dL Final  3/81/0175 10:25 PM 1.50* 0.50 - 1.35 mg/dL Final  8/52/7782  4:23 PM 1.53* 0.50 - 1.35 mg/dL Final  5/36/1443  1:54 AM 1.44* 0.50 - 1.35 mg/dL Final  0/08/6759  9:50 AM 1.44* 0.50 - 1.35 mg/dL Final  9/32/6712 45:80 PM 1.49* 0.50 - 1.35 mg/dL Final  9/98/3382  5:05 PM 1.38* 0.50 - 1.35 mg/dL Final  3/97/6734  1:93 AM 1.57* 0.50 - 1.35 mg/dL Final  7/90/2409 73:53 PM 1.53* 0.50 - 1.35 mg/dL Final     PMHx:   Past Medical History  Diagnosis Date  . Hypertension   . H/O kidney transplant   . Hepatitis   . Chronic kidney disease   . Pneumonia     Past Surgical History  Procedure Date  . Nephrectomy transplanted organ   . Insertion of dialysis catheter 06/20/2011    Procedure: INSERTION OF DIALYSIS CATHETER;  Surgeon: [name], MD;  Location: [facility] OR;  Service: Vascular;  Laterality: Right;  Ultrasound guided insertion of right internal jugular dialysis catheter  . Multiple extractions with alveoloplasty 06/27/2011    Procedure: MULTIPLE EXTRACION WITH ALVEOLOPLASTY;  Surgeon: [name], DDS;  Location: [facility] OR;  Service: Oral Surgery;  Laterality: N/A;  Extraction  of tooth # 14 with alveoloplasty  . Hip surgery     Family Hx: No family history on file.  Social History:  reports that he has never smoked. He does not have any smokeless tobacco history on file. He reports that he does not drink alcohol or use illicit drugs.  Allergies: No Known Allergies  Medications: Prior to Admission medications   Medication Sig Start Date End Date Taking? Authorizing Provider  amiodarone (PACERONE) 200 MG tablet Take 200 mg by mouth daily. 07/16/11 07/15/12 Yes Daniel J Angiulli, PA  calcitRIOL  (ROCALTROL) 0.25 MCG capsule Take 0.25 mcg by mouth daily. 07/16/11 07/15/12 Yes Mcarthur Rossetti Angiulli, PA  carvedilol (COREG) 3.125 MG tablet Take 3.125 mg by mouth 2 (two) times daily with a meal. 07/16/11 07/15/12 Yes Daniel J Angiulli, PA  famotidine (PEPCID) 20 MG tablet Take 1 tablet (20 mg total) by mouth 2 (two) times daily. 08/09/11 08/08/12 Yes  Sunnie Nielsen, MD  furosemide (LASIX) 40 MG tablet Take 40 mg by mouth 2 (two) times daily. 07/16/11 07/15/12 Yes Daniel J Angiulli, PA  HYDROcodone-acetaminophen (NORCO) 5-325 MG per tablet Take 1-2 tablets by mouth every 4 (four) hours as needed. For pain   Yes Historical Provider, MD  pantoprazole (PROTONIX) 40 MG tablet Take 40 mg by mouth 2 (two) times daily before a meal. 07/16/11 07/15/12 Yes Daniel J Angiulli, PA  potassium chloride (K-DUR,KLOR-CON) 10 MEQ tablet Take 10 mEq by mouth daily. 07/16/11 07/15/12 Yes Daniel J Angiulli, PA  predniSONE (DELTASONE) 10 MG tablet Take 10 mg by mouth daily.   Yes Historical Provider, MD  simvastatin (ZOCOR) 40 MG tablet Take 40 mg by mouth every evening.   Yes Historical Provider, MD  sirolimus (RAPAMUNE) 1 MG/ML solution Take 1.5 mg by mouth daily. 07/16/11 07/15/12 Yes Daniel J Angiulli, PA  methocarbamol (ROBAXIN) 500 MG tablet Take 1 tablet (500 mg total) by mouth 2 (two) times daily. 10/02/11 10/12/11  [name], MD  oxyCODONE-acetaminophen (PERCOCET/ROXICET) 5-325 MG per tablet Take 2 tablets by mouth every 4 (four) hours as needed for pain. 10/02/11 10/12/11  [name], MD  valACYclovir (VALTREX) 1000 MG tablet Take 1 tablet (1,000 mg total) by mouth 3 (three) times daily. 10/02/11 10/16/11  [name], MD    I have reviewed the patient's current medications.  Labs:  Results for orders placed during the hospital encounter of 10/02/11 (from the past 48 hour(s))  CBC WITH DIFFERENTIAL     Status: Abnormal   Collection Time   10/02/11  1:28 PM      Component Value Range Comment   WBC 18.0 (*) 4.0 - 10.5  K/uL    RBC 3.85 (*) 4.22 - 5.81 MIL/uL    Hemoglobin 10.2 (*) 13.0 - 17.0 g/dL    HCT 91.4 (*) 78.2 - 52.0 %    MCV 81.3  78.0 - 100.0 fL    MCH 26.5  26.0 - 34.0 pg    MCHC 32.6  30.0 - 36.0 g/dL    RDW 95.6 (*) 21.3 - 15.5 %    Platelets 197  150 - 400 K/uL    Neutrophils Relative 81 (*) 43 - 77 %    Neutro Abs 14.6 (*) 1.7 - 7.7 K/uL    Lymphocytes Relative 9 (*) 12 - 46 %    Lymphs Abs 1.6  0.7 - 4.0 K/uL    Monocytes Relative 10  3 - 12 %    Monocytes Absolute 1.8 (*) 0.1 - 1.0 K/uL    Eosinophils Relative 0  0 - 5 %    Eosinophils Absolute 0.0  0.0 - 0.7 K/uL    Basophils Relative 0  0 - 1 %    Basophils Absolute 0.1  0.0 - 0.1 K/uL   COMPREHENSIVE METABOLIC PANEL     Status: Abnormal   Collection Time   10/02/11  1:28 PM      Component Value Range Comment   Sodium 130 (*) 135 - 145 mEq/L    Potassium 3.7  3.5 - 5.1 mEq/L    Chloride 94 (*) 96 - 112 mEq/L    CO2 21  19 - 32 mEq/L    Glucose, Bld 111 (*) 70 - 99 mg/dL    BUN 49 (*) 6 - 23 mg/dL    Creatinine, Ser 0.86 (*) 0.50 - 1.35 mg/dL    Calcium 9.3  8.4 - 57.8 mg/dL  Total Protein 8.2  6.0 - 8.3 g/dL    Albumin 3.1 (*) 3.5 - 5.2 g/dL    AST 43 (*) 0 - 37 U/L    ALT 14  0 - 53 U/L    Alkaline Phosphatase 52  39 - 117 U/L    Total Bilirubin 0.8  0.3 - 1.2 mg/dL    GFR calc non Af Amer 22 (*) >90 mL/min    GFR calc Af Amer 26 (*) >90 mL/min   PRO B NATRIURETIC PEPTIDE     Status: Abnormal   Collection Time   10/02/11  1:29 PM      Component Value Range Comment   Pro B Natriuretic peptide (BNP) >70000.0 (*) 0 - 125 pg/mL   URINALYSIS, ROUTINE W REFLEX MICROSCOPIC     Status: Abnormal   Collection Time   10/02/11  1:33 PM      Component Value Range Comment   Color, Urine YELLOW  YELLOW    APPearance CLEAR  CLEAR    Specific Gravity, Urine 1.017  1.005 - 1.030    pH 5.5  5.0 - 8.0    Glucose, UA NEGATIVE  NEGATIVE mg/dL    Hgb urine dipstick TRACE (*) NEGATIVE    Bilirubin Urine NEGATIVE  NEGATIVE    Ketones,  ur NEGATIVE  NEGATIVE mg/dL    Protein, ur 308 (*) NEGATIVE mg/dL    Urobilinogen, UA 0.2  0.0 - 1.0 mg/dL    Nitrite NEGATIVE  NEGATIVE    Leukocytes, UA NEGATIVE  NEGATIVE   URINE MICROSCOPIC-ADD ON     Status: Abnormal   Collection Time   10/02/11  1:33 PM      Component Value Range Comment   Squamous Epithelial / LPF FEW (*) RARE    WBC, UA 0-2  <3 WBC/hpf    Bacteria, UA FEW (*) RARE    Casts GRANULAR CAST (*) NEGATIVE      ROS: A major issue for which he came to the emergency department and was left upper back pain which wraps around to the pectoral area. There's not been any rash yet but was possibly suspicious for shingles. Patient denied any shortness of breath even though he was hypoxic. He states he's been coughing "a little bit. His lower extremity edema is much improved but it is still slightly present. He denies any abdominal issues such as nausea vomiting diarrhea constipation. He has not had any fevers. Patient denies any urinary symptoms such as dysuria hematuria or excessive foaminess to the urine.    Physical Exam: Filed Vitals:   10/02/11 1742  BP: 167/107  Pulse: 93  Temp:   Resp: 15     General: A fairly well appearing black male who is alert, oriented and in no acute distress. He is slightly emotional because he doesn't want to be admitted to the hospital again. 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 HEENT: Pupils are equal round reactive to light, extra motions are intact, visualization of the eyegrounds as not done. Mucous membranes are moist. Heart: Regular rate and rhythm without murmur, gallop, or rub. Lungs: Decreased breath sounds some crackles at the bases bilaterally. Abdomen: Obese, soft, nontender, nondistended. There is no hepatosplenomegaly and no abdominal bruits are appreciated Extremities: Bilateral lower chart these are wrapped with Ace wraps. There is some pitting edema to the upper legs. Wounds are not  examined. Neuro: Alert and oriented and nonfocal.  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 He now presents with an elevated white blood count, the appearance of volume overload for admission to the hospital. 1.Renal- status post cadeveric renal transplant with advanced allograft nephropathy. Patient required transient dialysis during his hospitalization in June. For whatever reason, his creatinine appears to be improved from baseline. This is possibly due to volume overload. It appears the patient will need diuresis. 2. Hypertension/volume  - as above patient appears to be volume overloaded by his chest x-ray and blood pressure. I will increase his diuretics and use IV for right now.Marland Kitchen His Coreg can be continued. 3. ID- my guess for the source of his leukocytosis would be most likely his lower extremity wounds. A pulmonary source could also be suspected. His urinalysis is clean he does not have any GI issues. I will defer antibiotics to try at hospitalists. 4. Anemia  - probably due to his chronic kidney disease as well as his recent hospitalizations. I will check iron stores and given a dose of Aranesp.  Thank you for this consultation we will continue to follow with you   Malike Foglio A 10/02/2011, 6:06 PM

## 2011-10-02 NOTE — ED Provider Notes (Signed)
History   Scribed for Chad Baker, MD, the patient was seen in room TR07C/TR07C . This chart was scribed by Lewanda Rife.    CSN: [id number]  Arrival date & time 10/02/11  1217   First MD Initiated Contact with Patient 10/02/11 1230      Chief Complaint  Patient presents with  . Back Pain    (Consider location/radiation/quality/duration/timing/severity/associated sxs/prior treatment) The history is provided by the patient.   Chad [name] is a 56 y.o. male who presents to the Emergency Department complaining of constant left sided lateral thoracic back pain for the past 2 weeks. Pt describes the pain as sharp,burningg, and severe. Pt states pain intensity increases with movement. Pt denies fever, cough, and shortness of breath. Pt denies injury or any recent fall. Pt reports hx of shingles 12 years ago.   Past Medical History  Diagnosis Date  . Hypertension   . H/O kidney transplant   . Hepatitis   . Chronic kidney disease   . Pneumonia     Past Surgical History  Procedure Date  . Nephrectomy transplanted organ   . Insertion of dialysis catheter 06/20/2011    Procedure: INSERTION OF DIALYSIS CATHETER;  Surgeon: [name], MD;  Location: [facility] OR;  Service: Vascular;  Laterality: Right;  Ultrasound guided insertion of right internal jugular dialysis catheter  . Multiple extractions with alveoloplasty 06/27/2011    Procedure: MULTIPLE EXTRACION WITH ALVEOLOPLASTY;  Surgeon: [name], DDS;  Location: [facility] OR;  Service: Oral Surgery;  Laterality: N/A;  Extraction  of tooth # 14 with alveoloplasty  . Hip surgery     No family history on file.  History  Substance Use Topics  . Smoking status: Never Smoker   . Smokeless tobacco: Not on file  . Alcohol Use: No      Review of Systems  Musculoskeletal: Positive for back pain.  All other systems reviewed and are negative.    Allergies  Review of patient's allergies indicates no known  allergies.  Home Medications   Current Outpatient Rx  Name Route Sig Dispense Refill  . AMIODARONE HCL 200 MG PO TABS Oral Take 200 mg by mouth daily.    Marland Kitchen CALCITRIOL 0.25 MCG PO CAPS Oral Take 0.25 mcg by mouth daily.    Marland Kitchen CARVEDILOL 3.125 MG PO TABS Oral Take 3.125 mg by mouth 2 (two) times daily with a meal.    . FAMOTIDINE 20 MG PO TABS Oral Take 1 tablet (20 mg total) by mouth 2 (two) times daily. 30 tablet 0  . FUROSEMIDE 40 MG PO TABS Oral Take 40 mg by mouth 2 (two) times daily.    Marland Kitchen HYDROCODONE-ACETAMINOPHEN 5-325 MG PO TABS Oral Take 1-2 tablets by mouth every 4 (four) hours as needed. For pain    . PANTOPRAZOLE SODIUM 40 MG PO TBEC Oral Take 40 mg by mouth 2 (two) times daily before a meal.    . POTASSIUM CHLORIDE CRYS ER 10 MEQ PO TBCR Oral Take 10 mEq by mouth daily.    Marland Kitchen SIMVASTATIN 40 MG PO TABS Oral Take 40 mg by mouth every evening.    Marland Kitchen SIROLIMUS 1 MG/ML PO SOLN Oral Take 1.5 mg by mouth daily.      BP 161/103  Pulse 99  Temp 99.8 F (37.7 C)  Resp 20  SpO2 91%  Physical Exam  Nursing note and vitals reviewed. Constitutional: He is oriented to person, place, and time. He appears well-developed and well-nourished.  HENT:  Head: Normocephalic and atraumatic.  Eyes: Conjunctivae and EOM are normal.  Neck: Normal range of motion.  Cardiovascular: Normal rate.   Pulmonary/Chest: Effort normal and breath sounds normal.  Abdominal: Soft.  Musculoskeletal: Normal range of motion. He exhibits tenderness.       Point tenderness at the left scapular region and pain with trunk rotation   Neurological: He is alert and oriented to person, place, and time.  Skin: Skin is warm and dry.       Healed macular rash, no vesicles noted, dermatomal distribution  Psychiatric: He has a normal mood and affect.    ED Course  Procedures (including critical care time)  Labs Reviewed - No data to display No results found.   No diagnosis found.    MDM  Patient with possible  early shingles and also resolves shingles. Positive musculoskeletal back pain. Denies any dyspnea at this time although he did note and in the triage note. No cough.  1:00 PM Patient was be discharged and pulse ox was low at 85% on room air. He's had a chest x-ray and further workup and will be sent to the CDU to complete his evaluation    I personally performed the services described in this documentation, which was scribed in my presence. The recorded information has been reviewed and considered.     Chad Baker, MD 10/02/11 [phone]

## 2011-10-02 NOTE — ED Provider Notes (Signed)
4:40 p.m. - Patient remains comfortable - pain only when moving or in certain positions. He reports no history of CHF or any other cardiac problem. He was seen today for upper left back pain and plan was to discharge home when his O2 sat was found to be 85% on room air. He denies having any SOB, pleuritic pain. No recent cough or fever. Lab studies ordered and abnormalities are felt to require admission: WBC 18,000; BNP >70,000; CXR Mild Edema. Dr. Caryn Section paged.   5:00 - discussed with Dr. Lacy Duverney who defers admission to [facility].  Rodena Medin, PA-C 10/16/11 1044

## 2011-10-02 NOTE — ED Provider Notes (Signed)
Pt sent from Stretcher Triage to CDU for work up of his back pain and hypoxia.  Hx of kidney transplant.  Patient with possible early shingles and also resolves shingles. Positive musculoskeletal back pain. Denies any dyspnea at this time although he did note and in the triage note. No cough.  1:00 PM  Patient was be discharged and pulse ox was low at 85% on room air. He's had a chest x-ray and further workup and will be sent to the CDU to complete his evaluation    Report given to Melvenia Beam, PA-C who will continue to manage pt and will admit as appropriate.    Fayrene Helper, PA-C 10/02/11 1528

## 2011-10-02 NOTE — ED Notes (Signed)
Patient resting comfortably on stretcher watching TV states developed left middle back pain recently and was in the hospital for pneumonia couple of months ago and have been sleeping on left side.  Denies any trauma.  Airway intact bilateral equal chest rise and fall.  Pain currently 6/10 achy sharp.

## 2011-10-02 NOTE — Progress Notes (Signed)
ANTIBIOTIC CONSULT NOTE - INITIAL  Pharmacy Consult for Vancomycin Indication: Empiric - possible PNA with immunocompromised state  No Known Allergies  Patient Measurements:  Weight: 112kg Height: 73 inches  Vital Signs: Temp: 99.8 F (37.7 C) (09/05 1221) BP: 176/111 mmHg (09/05 1845) Pulse Rate: 93  (09/05 1845) Intake/Output from previous day:   Intake/Output from this shift:    Labs:  Basename 10/02/11 1328  WBC 18.0*  HGB 10.2*  PLT 197  LABCREA --  CREATININE 2.95*   The CrCl is unknown because both a height and weight (above a minimum accepted value) are required for this calculation. No results found for this basename: VANCOTROUGH:2,VANCOPEAK:2,VANCORANDOM:2,GENTTROUGH:2,GENTPEAK:2,GENTRANDOM:2,TOBRATROUGH:2,TOBRAPEAK:2,TOBRARND:2,AMIKACINPEAK:2,AMIKACINTROU:2,AMIKACIN:2, in the last 72 hours   Microbiology: No results found for this or any previous visit (from the past 720 hour(s)).  Medical History: Past Medical History  Diagnosis Date  . Hypertension   . H/O kidney transplant   . Hepatitis   . Chronic kidney disease   . Pneumonia     Medications:  See Electronic Med Rec  Assessment: 56yom s/p renal transplant to start empiric Vancomycin and Levaquin for possible PNA and existing leg wound with immunocompromised state. SCr has trended down from previous admission (~4--> 2.95), CrCl ~37 ml/min (normalized CrCl ~73ml/min). - Tmax 99.8, WBC 18 - Blood and urine cultures ordered  Goal of Therapy:  Vancomycin trough 15-20  Plan:  1. Vancomycin 2g IV x 1, then 1.5g IV q48h 2. Continue Levaquin 750mg  IV q48h - dose appropriate for renal function 3. Monitor renal function, UOP, clinical course and order level when indicated  Cleon Dew [phone] 10/02/2011,7:53 PM

## 2011-10-02 NOTE — ED Notes (Signed)
Admit Doctor at bedside.  

## 2011-10-02 NOTE — ED Notes (Signed)
Pt O2 sats rechecked. Pt was 85% on room air. Pt was placed on 3L of O2 with sats at 93%. MD notified

## 2011-10-02 NOTE — ED Notes (Signed)
Left Back pain x 2 weeks  Some sob  He states

## 2011-10-03 ENCOUNTER — Encounter (HOSPITAL_COMMUNITY): Payer: Self-pay | Admitting: Cardiology

## 2011-10-03 ENCOUNTER — Encounter (HOSPITAL_BASED_OUTPATIENT_CLINIC_OR_DEPARTMENT_OTHER): Payer: BC Managed Care – PPO | Attending: General Surgery

## 2011-10-03 ENCOUNTER — Inpatient Hospital Stay (HOSPITAL_COMMUNITY): Payer: BC Managed Care – PPO

## 2011-10-03 DIAGNOSIS — J189 Pneumonia, unspecified organism: Secondary | ICD-10-CM

## 2011-10-03 DIAGNOSIS — L97809 Non-pressure chronic ulcer of other part of unspecified lower leg with unspecified severity: Secondary | ICD-10-CM | POA: Insufficient documentation

## 2011-10-03 DIAGNOSIS — I319 Disease of pericardium, unspecified: Secondary | ICD-10-CM

## 2011-10-03 DIAGNOSIS — I5023 Acute on chronic systolic (congestive) heart failure: Principal | ICD-10-CM

## 2011-10-03 DIAGNOSIS — I4892 Unspecified atrial flutter: Secondary | ICD-10-CM

## 2011-10-03 LAB — CBC WITH DIFFERENTIAL/PLATELET
Basophils Absolute: 0 10*3/uL (ref 0.0–0.1)
Eosinophils Relative: 1 % (ref 0–5)
Lymphocytes Relative: 9 % — ABNORMAL LOW (ref 12–46)
MCV: 80.7 fL (ref 78.0–100.0)
Neutro Abs: 10.5 10*3/uL — ABNORMAL HIGH (ref 1.7–7.7)
Neutrophils Relative %: 72 % (ref 43–77)
Platelets: 169 10*3/uL (ref 150–400)
RDW: 18.4 % — ABNORMAL HIGH (ref 11.5–15.5)
WBC: 14.6 10*3/uL — ABNORMAL HIGH (ref 4.0–10.5)

## 2011-10-03 LAB — COMPREHENSIVE METABOLIC PANEL
ALT: 14 U/L (ref 0–53)
AST: 40 U/L — ABNORMAL HIGH (ref 0–37)
CO2: 22 mEq/L (ref 19–32)
Calcium: 9 mg/dL (ref 8.4–10.5)
GFR calc non Af Amer: 23 mL/min — ABNORMAL LOW (ref >90)
Sodium: 135 mEq/L (ref 135–145)
Total Protein: 7.4 g/dL (ref 6.0–8.3)

## 2011-10-03 LAB — LEGIONELLA ANTIGEN, URINE: Legionella Antigen, Urine: NEGATIVE

## 2011-10-03 LAB — TSH: TSH: 1.572 u[IU]/mL (ref 0.350–4.500)

## 2011-10-03 LAB — MRSA PCR SCREENING: MRSA by PCR: NEGATIVE

## 2011-10-03 LAB — URINE CULTURE

## 2011-10-03 MED ORDER — SIROLIMUS 1 MG/ML PO SOLN
1.5000 mg | Freq: Every day | ORAL | Status: DC
  Filled 2011-10-03 (×11): qty 1.5

## 2011-10-03 MED ORDER — ASPIRIN 81 MG PO CHEW
81.0000 mg | CHEWABLE_TABLET | Freq: Every day | ORAL | Status: DC
  Administered 2011-10-03 – 2011-10-05 (×3): 81 mg via ORAL
  Filled 2011-10-03 (×2): qty 1

## 2011-10-03 MED ORDER — SIROLIMUS 1 MG/ML PO SOLN
1.5000 mg | Freq: Every day | ORAL | Status: DC
  Filled 2011-10-03: qty 1.5

## 2011-10-03 MED ORDER — HYDRALAZINE HCL 25 MG PO TABS
25.0000 mg | ORAL_TABLET | Freq: Three times a day (TID) | ORAL | Status: DC
  Administered 2011-10-03: 25 mg via ORAL
  Filled 2011-10-03 (×4): qty 1

## 2011-10-03 MED ORDER — SIROLIMUS 1 MG/ML PO SOLN
1.5000 mg | Freq: Every day | ORAL | Status: DC
  Administered 2011-10-04 – 2011-10-05 (×2): 1.5 mg via ORAL
  Filled 2011-10-03 (×2): qty 1.5

## 2011-10-03 MED ORDER — HYDROXYZINE HCL 10 MG PO TABS
10.0000 mg | ORAL_TABLET | Freq: Once | ORAL | Status: AC
  Administered 2011-10-03: 10 mg via ORAL
  Filled 2011-10-03: qty 1

## 2011-10-03 MED ORDER — DEXTROSE 5 % IV SOLN
2.0000 g | INTRAVENOUS | Status: DC
  Administered 2011-10-03 – 2011-10-04 (×2): 2 g via INTRAVENOUS
  Filled 2011-10-03 (×3): qty 2

## 2011-10-03 MED ORDER — CAMPHOR-MENTHOL 0.5-0.5 % EX LOTN
TOPICAL_LOTION | CUTANEOUS | Status: DC | PRN
  Filled 2011-10-03: qty 222

## 2011-10-03 MED ORDER — HYDRALAZINE HCL 25 MG PO TABS
25.0000 mg | ORAL_TABLET | Freq: Two times a day (BID) | ORAL | Status: DC
  Administered 2011-10-03: 25 mg via ORAL
  Filled 2011-10-03 (×2): qty 1

## 2011-10-03 MED ORDER — ISOSORBIDE MONONITRATE ER 30 MG PO TB24
30.0000 mg | ORAL_TABLET | Freq: Every day | ORAL | Status: DC
  Administered 2011-10-03 – 2011-10-05 (×3): 30 mg via ORAL
  Filled 2011-10-03 (×4): qty 1

## 2011-10-03 MED ORDER — NONFORMULARY OR COMPOUNDED ITEM
1.5000 mg | Freq: Every day | Status: DC
  Administered 2011-10-03: 1.5 mg via ORAL
  Filled 2011-10-03 (×2): qty 1

## 2011-10-03 MED ORDER — SIROLIMUS 1 MG PO TABS
1.5000 mg | ORAL_TABLET | Freq: Once | ORAL | Status: DC
  Filled 2011-10-03: qty 2

## 2011-10-03 MED ORDER — CARVEDILOL 6.25 MG PO TABS
6.2500 mg | ORAL_TABLET | Freq: Two times a day (BID) | ORAL | Status: DC
  Administered 2011-10-03 – 2011-10-05 (×4): 6.25 mg via ORAL
  Filled 2011-10-03 (×6): qty 1

## 2011-10-03 MED ORDER — REGADENOSON 0.4 MG/5ML IV SOLN
0.4000 mg | Freq: Once | INTRAVENOUS | Status: AC
  Administered 2011-10-04: 0.4 mg via INTRAVENOUS
  Filled 2011-10-03: qty 5

## 2011-10-03 MED ORDER — DOXYCYCLINE HYCLATE 100 MG PO TABS
100.0000 mg | ORAL_TABLET | Freq: Two times a day (BID) | ORAL | Status: DC
Start: 2011-10-03 — End: 2011-10-05
  Administered 2011-10-03 – 2011-10-05 (×4): 100 mg via ORAL
  Filled 2011-10-03 (×5): qty 1

## 2011-10-03 NOTE — Consult Note (Signed)
CARDIOLOGY CONSULT NOTE  Patient ID: KA FLAMMER, MRN: [id number], DOB/AGE: Jan 12, 1956 56 y.o. Admit date: 10/02/2011 Date of Consult: 10/03/2011  Primary Physician: [name], MD Primary Cardiologist: Dr. Shirlee Latch in consultation 05/2011  Chief Complaint: left thoracic back pain Reason for Consultation: CHF, EKG changes  HPI: 56 y.o. male w/ complex PMHx including Cardiomyopathy (newly diagnosed 05/2011, EF 25-30% ? 2/2 viral myocarditis in the setting of septic shock), NSTEMI (05/2011 in the setting of septic shock), Atrial flutter (dx 05/2011 in the setting of septic shock, on amio, no anticoag?), HTN, HLD, Hep C and renal transplant 1996 who presented to [facility] on 10/02/2011 with complaints of left thoracic back pain and found to be hypoxic.  Patient was hospitalized 5/8-5/31/13 with acute respiratory failure 2/2 parainfluenza PNA and septic shock. He had a prolonged hospitalization due to ARDS requiring intubation/tracheostomy, acute on chronic renal failure requiring CVVHD, newly diagnosed cardiomyopathy, and newly diagnosed atrial flutter. He was consulted by Dr. Shirlee Latch who noted his cardiomyopathy was likely r/t viral myocarditis or septic shock. EF improved from 15-20% to 25-30% at time of discharge. Coumadin was not initiated at that time due to thrombocytopenia. He had mild elevation of cardiac enzymes (peak troponin 0.93) felt type 2 in the setting of acute illness. He was discharged to inpatient rehab where he stayed until 07/16/11. He was again hospitalized 7/31-08/30/11 for worsening of chronic LE wounds with positive wound culture for Proteus mirabilis and Serratia marcescens for which he was treated with IV abx. Was discharged and has been following with the wound clinic.  Prior to presentation he reports feeling well without complaints of sob, chest pain, orthopnea, worsening of chronic LE edema, palpitations, syncope, cough, fever, chills. Note he was out of his Coreg for  about 3 days prior to admission. He noted left thoracic back pain that he felt was due to a pulled muscle and was worse with movement. He presented to [facility] on 9/5 with complaints of left thoracic back pain. It was felt due to shingles and he was going to be discharged home until his O2 sats were noted to be in the low 80s and CXR showed pulmonary edema. Labs are significant for WBC 18.0-->14.6 (on steroids), Hgb 10.2 -->9.3, BUN/Crt 49/2.95, pBNP >70,000, normal troponin x4. He was admitted by the medicine service placed on IV abx and IV lasix. Echo shows EF 30-35%, diffuse hypokinesis, severe hypokinesis of inferoposterior myocardium, high ventricular filling pressure, mild-mod MR, mod-severe LAE, mod RAE, PASP . EKG shows NSR nonspecific ST/T changes, QTc 557. Diuresed net (-) 2.2L. Weight down 4lbs. SBPs have been elevated for which he was started on Hydralazine.    Past Medical History  Diagnosis Date  . Hypertension   . H/O kidney transplant 1996  . Hepatitis C 1996  . Chronic kidney disease   . Pneumonia   . HLD (hyperlipidemia)   . Cardiomyopathy     unknown etiology, EF 30-35% by echo 09/2011  . Systolic CHF   . Atrial flutter     in the setting of sepsis 05/2011; on amio; no anticoagulation      06/25/11 - Echo Study Conclusions: - Left ventricle: Systolic function was severely reduced. The estimated ejection fraction was in the range of 25% to 30%. Diffuse hypokinesis. - Aortic valve: Poorly visualized. - Mitral valve: Moderate regurgitation. - Left atrium: The atrium was mildly dilated. - Right ventricle: Poorly visualized. - Pericardium, extracardiac: A trivial pericardial effusion was identified. Impressions: Limited echo  to assess EF. This was a difficult study as patient was sitting in a chair during the study. The LV was  moderately dilated. There was global hypokinesis, EF estimated 25-30%.  06/08/11 - Echo Study Conclusions: - Left ventricle: The cavity size  was normal. Wall thickness was increased in a pattern of mild LVH. The estimated ejection fraction was in the range of 15% to 20%. Diffuse hypokinesis. The study is not technically sufficient to allow evaluation of LV diastolic function. - Regional wall motion abnormality: Akinesis of the basal-mid inferior, mid inferolateral, mid anterolateral, and apical lateral myocardium; hypokinesis of the apical myocardium. - Aortic valve: Mildly to moderately calcified annulus. - Mitral valve: Calcified annulus. Mild regurgitation. - Left atrium: The atrium was moderately to severely dilated. - Right ventricle: The cavity size was moderately dilated. Systolic function was moderately reduced. - Right atrium: The atrium was mildly dilated. - Atrial septum: No defect or patent foramen ovale was identified. - Tricuspid valve: Dilated annulus. - Pulmonary arteries: PA peak pressure: 52mm Hg (S). - Inferior vena cava: The vessel was dilated; the respirophasic diameter changes were blunted (< 50%); findings are consistent with elevated central venous pressure. - Pericardium, extracardiac: A trivial pericardial effusion was identified. Impressions: The right ventricular systolic pressure was increased consistent with moderate pulmonary hypertension.  04/04/09 - Echo Study Conclusions: - Left ventricle: The cavity size was normal. Wall thickness was   increased increased in a pattern of mild to moderate LVH. Systolic   function was normal. The estimated ejection fraction was in the range of 60% to 65%. Doppler parameters are consistent with  abnormal left ventricular relaxation (grade 1 diastolic  dysfunction). - Aortic valve: Valve area: 2.08cm\S\2(VTI). Valve area: 2.14cm\S\2  (Vmax). - Mitral valve: Calcified annulus. Valve area by continuity equation  (using LVOT flow): 1.62cm\S\2. - Left atrium: The atrium was moderately dilated. - Pulmonary arteries: PA peak pressure: 50mm Hg (S).   Surgical History:  Past  Surgical History  Procedure Date  . Nephrectomy transplanted organ   . Insertion of dialysis catheter 06/20/2011    Procedure: INSERTION OF DIALYSIS CATHETER;  Surgeon: [name], MD;  Location: [facility] OR;  Service: Vascular;  Laterality: Right;  Ultrasound guided insertion of right internal jugular dialysis catheter  . Multiple extractions with alveoloplasty 06/27/2011    Procedure: MULTIPLE EXTRACION WITH ALVEOLOPLASTY;  Surgeon: [name], DDS;  Location: [facility] OR;  Service: Oral Surgery;  Laterality: N/A;  Extraction  of tooth # 14 with alveoloplasty  . Hip surgery   . Joint replacement     left hip     Home Meds: Medication Sig  amiodarone (PACERONE) 200 MG tablet Take 200 mg by mouth daily.  calcitRIOL (ROCALTROL) 0.25 MCG capsule Take 0.25 mcg by mouth daily.  carvedilol (COREG) 3.125 MG tablet Take 3.125 mg by mouth 2 (two) times daily with a meal.  famotidine (PEPCID) 20 MG tablet Take 1 tablet (20 mg total) by mouth 2 (two) times daily.  furosemide (LASIX) 40 MG tablet Take 40 mg by mouth 2 (two) times daily.  HYDROcodone-acetaminophen (NORCO) 5-325 MG per tablet Take 1-2 tablets by mouth every 4 (four) hours as needed. For pain  pantoprazole (PROTONIX) 40 MG tablet Take 40 mg by mouth 2 (two) times daily before a meal.  potassium chloride (K-DUR,KLOR-CON) 10 MEQ tablet Take 10 mEq by mouth daily.  predniSONE (DELTASONE) 10 MG tablet Take 10 mg by mouth daily.  simvastatin (ZOCOR) 40 MG tablet Take 40 mg by  mouth every evening.  sirolimus (RAPAMUNE) 1 MG/ML solution Take 1.5 mg by mouth daily.  methocarbamol (ROBAXIN) 500 MG tablet Take 1 tablet (500 mg total) by mouth 2 (two) times daily.  oxyCODONE-acetaminophen (PERCOCET/ROXICET) 5-325 MG per tablet Take 2 tablets by mouth every 4 (four) hours as needed for pain.  valACYclovir (VALTREX) 1000 MG tablet Take 1 tablet (1,000 mg total) by mouth 3 (three) times daily.    Inpatient Medications:   . amiodarone  200 mg Oral  Daily  . antiseptic oral rinse  15 mL Mouth Rinse BID  . calcitRIOL  0.25 mcg Oral Daily  . carvedilol  3.125 mg Oral BID WC  . darbepoetin (ARANESP) injection - NON-DIALYSIS  60 mcg Subcutaneous Q Fri-1800  . famotidine  20 mg Oral BID  . furosemide  80 mg Intravenous Q12H  . furosemide  80 mg Intravenous Once  . heparin  5,000 Units Subcutaneous Q8H  . hydrALAZINE  25 mg Oral q12n4p  . hydrOXYzine  10 mg Oral Once  . levofloxacin  750 mg Oral Q48H  . pantoprazole  40 mg Oral BID AC  . potassium chloride  20 mEq Oral Daily  . predniSONE  10 mg Oral Q breakfast  . simvastatin  40 mg Oral QPM  . sirolimus  1.5 mg Oral Daily  . sodium chloride  3 mL Intravenous Q12H  . vancomycin  1,500 mg Intravenous Q48H  . vancomycin  2,000 mg Intravenous NOW    Allergies: No Known Allergies  History   Social History  . Marital Status: Single    Spouse Name: N/A    Number of Children: N/A  . Years of Education: N/A   Occupational History  . Not on file.   Social History Main Topics  . Smoking status: Never Smoker   . Smokeless tobacco: Not on file  . Alcohol Use: No  . Drug Use: No  . Sexually Active:    Other Topics Concern  . Not on file   Social History Narrative  . No narrative on file     Family History  Problem Relation Age of Onset  . Heart disease      No known family h/o CAD     Review of Systems: General: negative for chills, fever, night sweats or weight changes.  Cardiovascular: negative for chest pain, shortness of breath, dyspnea on exertion, edema, orthopnea, palpitations, or paroxysmal nocturnal dyspnea Dermatological: (+) itching; negative for rash Respiratory: negative for cough or wheezing Urologic: negative for hematuria Abdominal: negative for nausea, vomiting, diarrhea, bright red blood per rectum, melena, or hematemesis Neurologic: negative for visual changes, syncope, or dizziness Musculoskeletal: (+) Left thoracic back pain All other systems  reviewed and are otherwise negative except as noted above.  Labs:  [facility] 10/03/11 1150 10/03/11 0130 10/02/11 1938 10/02/11 1730  TROPONINI <0.30 <0.30 <0.30 <0.30     10/02/2011 13:29  Pro B Natriuretic peptide (BNP) >70000.0 (H)   Component Value Date   WBC 14.6* 10/03/2011   HGB 9.3* 10/03/2011   HCT 28.5* 10/03/2011   MCV 80.7 10/03/2011   PLT 169 10/03/2011    Lab 10/03/11 0130  NA 135  K 3.5  CL 99  CO2 22  BUN 51*  CREATININE 2.90*  CALCIUM 9.0  PROT 7.4  BILITOT 0.7  ALKPHOS 46  ALT 14  AST 40*  GLUCOSE 90    Radiology/Studies:   10/03/11 - Echo Study Conclusions: - Left ventricle: The cavity size was mildly dilated. Wall  thickness was increased in a pattern of moderate LVH. Systolic function was moderately to severely reduced. The estimated ejection fraction was 35%, in the range of 30% to 35%. Diffuse hypokinesis. There is severe hypokinesis of the inferoposterior myocardium. Doppler parameters are consistent with high ventricular filling pressure. - Aortic valve: Mild regurgitation. - Mitral valve: The findings are consistent with mild stenosis. Mild to moderate regurgitation. - Left atrium: The atrium was moderately to severely dilated. - Right ventricle: The cavity size was mildly dilated. Systolic function was mildly reduced. - Right atrium: The atrium was moderately dilated. - Tricuspid valve: Severe regurgitation. - Pulmonary arteries: Systolic pressure was severely increased. PA peak pressure: 73mm Hg (S). - Pericardium, extracardiac: A small pericardial effusion was identified.  10/02/2011 - Chest 2 View Findings: Cardiomegaly noted with increased vascular and interstitial prominence concerning for early edema.  No significant effusion.  No pneumothorax.  Rib deformities on the right from prior fracture.  Trachea midline.  IMPRESSION: Cardiomegaly with early interstitial edema.     10/03/2011 - Ribs Unilateral Left Findings: Enlargement of cardiac silhouette with  pulmonary vascular congestion. Mild diffuse pulmonary infiltrate/edema. Bones appear demineralized. No rib fracture or bone destruction identified.  IMPRESSION: No definite rib abnormalities. Enlargement of cardiac silhouette with pulmonary vascular congestion and pulmonary edema.       EKG: 10/02/11 @ 1738 - NSR 93bpm nonspecific ST/T changes, QTc 557  Physical Exam: Blood pressure 150/98, pulse 94, temperature 98.3 F (36.8 C), temperature source Oral, resp. rate 20, height 6\' 1"  (1.854 m), weight 239 lb 11.2 oz (108.727 kg), SpO2 100.00%. General: Overweight black male in no acute distress. Head: Normocephalic, atraumatic, sclera non-icteric, no xanthomas, nares are without discharge.  Neck: Supple. Negative for carotid bruits. JVD not elevated. Lungs: Fine bibasilar rales. No wheezes or rhonchi. Breathing is unlabored. Heart: RRR with S1 S2. 2/6 systolic murmur at apex No rubs, or gallops appreciated. Abdomen: Soft, non-tender, non-distended with normoactive bowel sounds. No hepatomegaly. No rebound/guarding. No obvious abdominal masses. Msk:  Left thoracic back pain with movement of arm and twisting. Strength and tone appear normal for age. Extremities: No clubbing or cyanosis. LE edema consistent with chronic LE lymphedema. Healing wounds to BLE.  Distal pedal pulses are intact and equal bilaterally. Neuro: Alert and oriented X 3. Moves all extremities spontaneously. Psych:  Responds to questions appropriately with a normal affect.   Assessment and Plan:  56 y.o. male w/ complex PMHx including Cardiomyopathy (newly diagnosed 05/2011, EF 25-30% ? 2/2 viral myocarditis in the setting of septic shock), NSTEMI (05/2011 in the setting of septic shock), Atrial flutter (dx 05/2011 in the setting of septic shock, on amio, no anticoag?), HTN, HLD, Hep C and renal transplant 1996 who presented to [facility] on 10/02/2011 with complaints of left thoracic back pain and found to be  hypoxic.  Signed, Stamatia Masri PA-C 10/03/2011, 3:25 PM  As above, patient seen and examined. Briefly 56 year old male with past medical history of cardiomyopathy, atrial flutter in the setting of sepsis, hypertension, hyperlipidemia, hepatitis C and previous renal transplant with chronic renal insufficiency run asked to evaluate for congestive heart failure. The patient was hospitalized in May with sepsis and an echocardiogram at that time showed an ejection fraction of 15-20%. 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 His troponin was minimally elevated. He was also in atrial flutter and placed on amiodarone. Coumadin was not initiated because of thrombocytopenia.he was readmitted in July with lower extremity cellulitis. Patient was readmitted  on September 5 with complaints of left thoracic back pain. This increased with certain movements. He was also noted to be hypoxic and his chest x-ray revealed pulmonary edema. Cardiac enzymes have been negative. Pro BNP on admission greater than 70,000. Repeat echocardiogram today shows an ejection fraction of 30-35%, diffuse hypokinesis, severe hypokinesis of the inferior posterior myocardium. There is mild to moderate mitral regurgitation with mild mitral stenosis. There was mild aortic insufficiency. There was biatrial enlargement and severe tricuspid regurgitation. There was a small pericardial effusion. His QT is also prolonged. No history of syncope. Patient denies chest pain, dyspnea on exertion, orthopnea or PND. He does have chronic pedal edema which she states is improved compared to his baseline. Electrocardiogram shows sinus rhythm at a rate of 88. Normal axis. Prolonged QT interval. Nonspecific ST changes.  #1 cardiomyopathy-etiology unclear. Ischemic evaluation needs to be performed. We'll proceed with Myoview. I would be hesitant to proceed with cardiac catheterization given previous renal transplant in severity of renal insufficiency/risk of  contrast nephropathy. We could consider if Myoview is high risk. Would treat LV dysfunction with hydralazine 25 mg by mouth 3 times a day. Add Imdur 30 mg daily. Increase carvedilol to 6.25 mg by mouth twice a day. Titrate medications as tolerated by pulse and blood pressure. Once his medications are fully titrated would need repeat echocardiogram to reassess LV function. Not clear that he would be a good candidate for ICD given severity of renal insufficiency and recurrent lower extremity infections. Check TSH.  #2-acute on chronic systolic congestive heart failure-the patient is relatively asymptomatic. Continue Lasix at present dose and follow renal function.  #3-history of atrial flutter-this occurred in the setting of sepsis. He remains in sinus rhythm on amiodarone. We'll continue for now. Add aspirin 81 mg daily. It is not clear to me that he will require chronic anticoagulation as his atrial flutter occurred in the setting of sepsis. We could consider discontinuing his amiodarone in the future once his acute illness improves.  #4-prolonged QT interval-this is most likely related to his amiodarone and Levaquin. I am not convinced that he has pneumonia. Hopefully his Levaquin can be discontinued but I will leave this to her primary team.  #5-renal insufficiency status post renal transplant-management per nephrology.  #6-hypertension-patient's blood pressure is elevated. Increase hydralazine, nitrates and carvedilol as tolerated. Question contribution and cardiomyopathy.  Dr. Olga Millers 3:25 PM

## 2011-10-03 NOTE — Progress Notes (Signed)
Orthopedic Tech Progress Note Patient Details:  Chad [name] 1955/12/10 [id number]  Ortho Devices Type of Ortho Device: Roland Rack boot Ortho Device/Splint Location: bilateral UNNA boot applied Ortho Device/Splint Interventions: Application   Jahree Dermody T 10/03/2011, 5:40 PM

## 2011-10-03 NOTE — Progress Notes (Signed)
Daily Renal Progress Note  Subjective:   Patient reports doing well this morning. No pain, shortness of breath, cough.  Urinating well.   Objective:   BP 165/107  Pulse 85  Temp 98.2 F (36.8 C) (Oral)  Resp 20  Ht 6\' 1"  (1.854 m)  Wt 239 lb 11.2 oz (108.727 kg)  BMI 31.62 kg/m2  SpO2 98%  Intake/Output Summary (Last 24 hours) at 10/03/11 1025 Last data filed at 10/03/11 0756  Gross per 24 hour  Intake    240 ml  Output   1975 ml  Net  -1735 ml   Weight change:   Physical Exam: Gen: alert, cooperative, NAD, Chad [name] in place CV: RRR, no murmurs or gallops Resp: CTAB, no rales or wheezes Ext: bilateral legs wrapped, dressings c/d/i  Imaging: Dg Chest 2 View  10/02/2011  *RADIOLOGY REPORT*  Clinical Data: Chest pain, rib pain, shortness of breath  CHEST - 2 VIEW  Comparison: 06/22/2011  Findings: Cardiomegaly noted with increased vascular and interstitial prominence concerning for early edema.  No significant effusion.  No pneumothorax.  Rib deformities on the right from prior fracture.  Trachea midline.  IMPRESSION: Cardiomegaly with early interstitial edema.   Original Report Authenticated By: Judie Petit. [name], M.D.    Dg Ribs Unilateral Left  10/03/2011  *RADIOLOGY REPORT*  Clinical Data: Left posterior chest/rib pain, known injury  LEFT RIBS - 2 VIEW  Comparison: Chest radiograph 10/02/2011  Findings: Enlargement of cardiac silhouette with pulmonary vascular congestion. Mild diffuse pulmonary infiltrate/edema. Bones appear demineralized. No rib fracture or bone destruction identified.  IMPRESSION: No definite rib abnormalities. Enlargement of cardiac silhouette with pulmonary vascular congestion and pulmonary edema.   Original Report Authenticated By: Lollie Marrow, M.D.     Labs: BMET  Lab 10/03/11 0130 10/02/11 1328  NA 135 130*  K 3.5 3.7  CL 99 94*  CO2 22 21  GLUCOSE 90 111*  BUN 51* 49*  CREATININE 2.90* 2.95*  ALB -- --  CALCIUM 9.0 9.3  PHOS -- --   CBC  Lab  10/03/11 0130 10/02/11 1328  WBC 14.6* 18.0*  NEUTROABS 10.5* 14.6*  HGB 9.3* 10.2*  HCT 28.5* 31.3*  MCV 80.7 81.3  PLT 169 197    Medications:      . amiodarone  200 mg Oral Daily  . antiseptic oral rinse  15 mL Mouth Rinse BID  . calcitRIOL  0.25 mcg Oral Daily  . carvedilol  3.125 mg Oral BID WC  . darbepoetin (ARANESP) injection - NON-DIALYSIS  60 mcg Subcutaneous Q Fri-1800  . famotidine  20 mg Oral BID  . furosemide  80 mg Intravenous Q12H  . furosemide  80 mg Intravenous Once  . heparin  5,000 Units Subcutaneous Q8H  . hydrOXYzine  10 mg Oral Once  . levofloxacin  750 mg Oral Q48H  . pantoprazole  40 mg Oral BID AC  . potassium chloride  20 mEq Oral Daily  . predniSONE  10 mg Oral Q breakfast  . simvastatin  40 mg Oral QPM  . sirolimus  1.5 mg Oral Daily  . sodium chloride  3 mL Intravenous Q12H  . vancomycin  1,500 mg Intravenous Q48H  . vancomycin  2,000 mg Intravenous NOW  . DISCONTD: carvedilol  3.125 mg Oral Q12H  . DISCONTD: sirolimus  1.5 mg Oral Daily     Assessment/ Plan:   Chad [name] is a 56 year old man with systolic CHF, hepC, renal transplant with CKD, and 2  recent infectious hospitalizations presenting with hypoxia and elevated white count with CXR concerning for pulmonary edema vs pneumonia.  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  On immunosuppresive therapy with prednisone 10mg  daily and sirolimus 1.5mg  daily. Was doing very well until his hospitalization in May/June for severe sepsis/ARDS, after which his creatinine stabilized in the 4-5 range.  Currently his creatinine has improved to 2.90, this may be due to increased renal perfusion secondary to volume overload. - continue diuresis for volume status - will monitor renal function daily; will likely be difficult to find a good balance between renal and cardiac function  # Volume status: Appears to be volume overloaded on exam and CXR.   Likely related to acute exacerbation of systolic heart failure secondary to being out of carvedilol for a few days.  Home lasix dose of 40mg  PO BID. - continue lasix 80mg  IV BID  # Hypertension: Poorly controlled. Will likely improve some with diuresis and restarting carvedilol. - could consider increasing carvedilol if needed - defer to primary to management  # ID: Possible HCAP.  At risk for opportunistic infections given immunosuppression.   - management per primary team  # Anemia: Likely multifactorial with recent hospitalizations and CKD.  Baseline seems to be wandering, but may around 9.  - will check iron studies - Aranesp ordered for every Friday  Dispo: Spoke with Dr. Arbutus Leas.  Will sign off.  Please call with additional questions.  Despina Hick, MD 10/03/2011, 10:25 AM

## 2011-10-03 NOTE — Consult Note (Signed)
WOC consult Note Reason for Consult: Consult requested for bilat una boots.  Pt states he is followed by the outpatient wound care center and they apply a dressing and bilat una boots Q Fri but he had to miss his appointment today because of admission to hospital. Removed una boots to assess legs.  Generalized edema to right leg, no open wounds or drainage.   Wound type: Left leg with chronic full thickness wound treated by wound care center with santyl and foam dressing. Pressure Ulcer POA: Not a pressure ulcer. Measurement: 2.5X2X.5cm Wound bed: Yellow wound bed with large amt thick tan drainage, no odor. Periwound: Skin intact surrounding wound, applied zinc cream to protect. Dressing procedure/placement/frequency: Applied Aquacel to provide antimicrobial benefits and absorb drainage.  Foam dressing applied over site.  Bedside nurse to page ortho tec to apply bilat una boots.  These should be changed Q Fri.  Pt can resume follow up at out-patient wound care center after discharge. Will not plan to follow further unless re-consulted.  [address], RN, MSN, [employer]  [phone]

## 2011-10-03 NOTE — Progress Notes (Signed)
  Echocardiogram 2D Echocardiogram has been performed.  Chad [name] [name] 10/03/2011, 10:28 AM

## 2011-10-03 NOTE — Progress Notes (Signed)
Utilization Review Completed.  

## 2011-10-03 NOTE — Progress Notes (Addendum)
[name] PROGRESS NOTE  Chad [name] RUE:[id number] DOB: 01/02/1956 DOA: 10/02/2011 PCP: [name], MD  Assessment/Plan: Acute systolic CHF  -Echocardiogram shows ejection fraction 30-35%, severe pulmonary hypertension -Concerned about ischemic component to the patient's cardiomyopathy given new EKG changes -Neg 1335 cc in the last 24 hours Pulmonary hypertension -Likely due to left-sided heart failure Hypertension  -Poorly controlled.  -Out of Coreg for the past 2 days prior to admission. This may have resulted in his CHF decompensation  -Add hydralazine, consider adding nitrate given CHF Abnormal EKG -Patient has new T wave inversions in V1-V3 as well as prolonged QT interval -Consult cardiology -Discontinue Levaquin in light of QT prolongation and concomitant use of amiodarone Leukocytosis  -Given the patient's renal transplant, immunocompromised status and patchy infiltrates, cannot rule out pneumonia, particularly atypical organisms -Blood cultures negative, WBC decreasing, continue vancomycin and Zosyn at this time -Repeat chest x-ray today to reevaluate infiltrates -Discontinue Levaquin, start cefepime and doxycycline Upper back pain  -Suspect a musculoskeletal issue given the patient's kyphotic posture -No visible rash on examination  Status post renal transplant  -Continue sirolimus  History of atrial fibrillation/atrial flutter  -Currently in sinus rhythm  -Continue amiodarone  Procedures/Studies: Dg Chest 2 View  10/02/2011  *RADIOLOGY REPORT*  Clinical Data: Chest pain, rib pain, shortness of breath  CHEST - 2 VIEW  Comparison: 06/22/2011  Findings: Cardiomegaly noted with increased vascular and interstitial prominence concerning for early edema.  No significant effusion.  No pneumothorax.  Rib deformities on the right from prior fracture.  Trachea midline.  IMPRESSION: Cardiomegaly with early interstitial edema.   Original Report Authenticated By: Judie Petit.  [name], M.D.    Dg Ribs Unilateral Left  10/03/2011  *RADIOLOGY REPORT*  Clinical Data: Left posterior chest/rib pain, known injury  LEFT RIBS - 2 VIEW  Comparison: Chest radiograph 10/02/2011  Findings: Enlargement of cardiac silhouette with pulmonary vascular congestion. Mild diffuse pulmonary infiltrate/edema. Bones appear demineralized. No rib fracture or bone destruction identified.  IMPRESSION: No definite rib abnormalities. Enlargement of cardiac silhouette with pulmonary vascular congestion and pulmonary edema.   Original Report Authenticated By: Lollie Marrow, M.D.        Antibiotics:  Vancomycin September 5>>>  Zosyn September 5>>>   Code Status: Full  Disposition Plan: Home when medically stable  Subjective: Patient is feeling better. His breathing is 75% better. He denies any chest discomfort, nausea, vomiting, diarrhea, dizziness, headache, abdominal pain, tissue area, hematuria.  Objective: Filed Vitals:   10/02/11 2155 10/02/11 2307 10/03/11 0230 10/03/11 0644  BP: 155/99 170/106 158/99 165/107  Pulse: 97 98 93 85  Temp: 98.6 F (37 C) 98.5 F (36.9 C) 98.5 F (36.9 C) 98.2 F (36.8 C)  TempSrc: Oral Oral Oral Oral  Resp: 22 20 20 20   Height:  6\' 1"  (1.854 m)    Weight:  110.451 kg (243 lb 8 oz)  108.727 kg (239 lb 11.2 oz)  SpO2: 96% 96% 97% 98%    Intake/Output Summary (Last 24 hours) at 10/03/11 1121 Last data filed at 10/03/11 0756  Gross per 24 hour  Intake    240 ml  Output   1975 ml  Net  -1735 ml   Weight change:  Exam:   General:  Pt is alert, follows commands appropriately, not in acute distress  HEENT: No icterus, No thrush, No neck mass, [place]/AT  Cardiovascular: Regular rate and rhythm, S1/S2, noS3  Respiratory: Clear to auscultation bilaterally, no wheezing, no crackles, no rhonchi  Abdomen: Soft, non tender, non distended, bowel sounds present, no guarding  Extremities: 2+ edema, No lymphangitis, No petechiae, No rashes, no  synovitis  Data Reviewed: Basic Metabolic Panel:  Lab 10/03/11 1610 10/02/11 1328  NA 135 130*  K 3.5 3.7  CL 99 94*  CO2 22 21  GLUCOSE 90 111*  BUN 51* 49*  CREATININE 2.90* 2.95*  CALCIUM 9.0 9.3  MG -- --  PHOS -- --   Liver Function Tests:  Lab 10/03/11 0130 10/02/11 1328  AST 40* 43*  ALT 14 14  ALKPHOS 46 52  BILITOT 0.7 0.8  PROT 7.4 8.2  ALBUMIN 2.7* 3.1*   No results found for this basename: LIPASE:5,AMYLASE:5 in the last 168 hours No results found for this basename: AMMONIA:5 in the last 168 hours CBC:  Lab 10/03/11 0130 10/02/11 1328  WBC 14.6* 18.0*  NEUTROABS 10.5* 14.6*  HGB 9.3* 10.2*  HCT 28.5* 31.3*  MCV 80.7 81.3  PLT 169 197   Cardiac Enzymes:  Lab 10/03/11 0130 10/02/11 1938 10/02/11 1730  CKTOTAL -- -- --  CKMB -- -- --  CKMBINDEX -- -- --  TROPONINI <0.30 <0.30 <0.30   BNP: No components found with this basename: POCBNP:5 CBG: No results found for this basename: GLUCAP:5 in the last 168 hours  Recent Results (from the past 240 hour(s))  MRSA PCR SCREENING     Status: Normal   Collection Time   10/02/11 11:18 PM      Component Value Range Status Comment   MRSA by PCR NEGATIVE  NEGATIVE Final      Scheduled Meds:   . amiodarone  200 mg Oral Daily  . antiseptic oral rinse  15 mL Mouth Rinse BID  . calcitRIOL  0.25 mcg Oral Daily  . carvedilol  3.125 mg Oral BID WC  . darbepoetin (ARANESP) injection - NON-DIALYSIS  60 mcg Subcutaneous Q Fri-1800  . famotidine  20 mg Oral BID  . furosemide  80 mg Intravenous Q12H  . furosemide  80 mg Intravenous Once  . heparin  5,000 Units Subcutaneous Q8H  . hydrOXYzine  10 mg Oral Once  . levofloxacin  750 mg Oral Q48H  . pantoprazole  40 mg Oral BID AC  . potassium chloride  20 mEq Oral Daily  . predniSONE  10 mg Oral Q breakfast  . simvastatin  40 mg Oral QPM  . sirolimus  1.5 mg Oral Daily  . sodium chloride  3 mL Intravenous Q12H  . vancomycin  1,500 mg Intravenous Q48H  .  vancomycin  2,000 mg Intravenous NOW  . DISCONTD: carvedilol  3.125 mg Oral Q12H  . DISCONTD: sirolimus  1.5 mg Oral Daily   Continuous Infusions:   . DISCONTD: sodium chloride 125 mL/hr at 10/02/11 2040     Chad Winbush, DO  [name] Pager [phone]  If 7PM-7AM, please contact night-coverage [email or web address] Password [email or web address] 10/03/2011, 11:21 AM   LOS: 1 day

## 2011-10-04 ENCOUNTER — Inpatient Hospital Stay (HOSPITAL_COMMUNITY): Payer: BC Managed Care – PPO

## 2011-10-04 DIAGNOSIS — I509 Heart failure, unspecified: Secondary | ICD-10-CM

## 2011-10-04 DIAGNOSIS — N179 Acute kidney failure, unspecified: Secondary | ICD-10-CM

## 2011-10-04 DIAGNOSIS — R0902 Hypoxemia: Secondary | ICD-10-CM

## 2011-10-04 LAB — BASIC METABOLIC PANEL
CO2: 24 mEq/L (ref 19–32)
Chloride: 101 mEq/L (ref 96–112)
Sodium: 138 mEq/L (ref 135–145)

## 2011-10-04 LAB — MAGNESIUM: Magnesium: 1.6 mg/dL (ref 1.5–2.5)

## 2011-10-04 LAB — CBC
Hemoglobin: 9 g/dL — ABNORMAL LOW (ref 13.0–17.0)
RBC: 3.44 MIL/uL — ABNORMAL LOW (ref 4.22–5.81)
WBC: 11.2 10*3/uL — ABNORMAL HIGH (ref 4.0–10.5)

## 2011-10-04 MED ORDER — FUROSEMIDE 40 MG PO TABS
40.0000 mg | ORAL_TABLET | Freq: Two times a day (BID) | ORAL | Status: DC
  Administered 2011-10-04 – 2011-10-05 (×2): 40 mg via ORAL
  Filled 2011-10-04 (×4): qty 1

## 2011-10-04 MED ORDER — HYDRALAZINE HCL 50 MG PO TABS
50.0000 mg | ORAL_TABLET | Freq: Three times a day (TID) | ORAL | Status: DC
  Administered 2011-10-04 – 2011-10-05 (×3): 50 mg via ORAL
  Filled 2011-10-04 (×6): qty 1

## 2011-10-04 MED ORDER — TECHNETIUM TC 99M TETROFOSMIN IV KIT
30.0000 | PACK | Freq: Once | INTRAVENOUS | Status: AC | PRN
  Administered 2011-10-04: 30 via INTRAVENOUS

## 2011-10-04 MED ORDER — FAMOTIDINE 20 MG PO TABS
20.0000 mg | ORAL_TABLET | Freq: Every day | ORAL | Status: DC
  Administered 2011-10-05: 20 mg via ORAL
  Filled 2011-10-04: qty 1

## 2011-10-04 MED ORDER — TIZANIDINE HCL 2 MG PO TABS
2.0000 mg | ORAL_TABLET | Freq: Every day | ORAL | Status: DC
  Administered 2011-10-04: 2 mg via ORAL
  Filled 2011-10-04 (×2): qty 1

## 2011-10-04 MED ORDER — TECHNETIUM TC 99M TETROFOSMIN IV KIT
10.0000 | PACK | Freq: Once | INTRAVENOUS | Status: AC | PRN
  Administered 2011-10-04: 10 via INTRAVENOUS

## 2011-10-04 NOTE — Progress Notes (Signed)
Subjective: Patient denies SOB  No CP.   Objective: Filed Vitals:   10/03/11 1658 10/03/11 1810 10/03/11 2105 10/04/11 0600  BP: 162/96 156/92 145/85 156/89  Pulse: 91  85 74  Temp:   97.9 F (36.6 C) 97.7 F (36.5 C)  TempSrc:   Oral Oral  Resp:   19 20  Height:      Weight:    238 lb 5.1 oz (108.1 kg)  SpO2:   95% 97%   Weight change: -5 lb 2.9 oz (-2.351 kg)  Intake/Output Summary (Last 24 hours) at 10/04/11 0841 Last data filed at 10/04/11 0653  Gross per 24 hour  Intake   1154 ml  Output   1775 ml  Net   -621 ml    General: Alert, awake, oriented x3, in no acute distress Neck:  JVP is normal Heart: Regular rate and rhythm, without murmurs, rubs, gallops.  Lungs: Rel CTA.  Decreased BS at base. Exemities:  Wrapped with ACE bandage  Edematous.  2+ pulses. Neuro: Grossly intact, nonfocal. TELE:  SR.  Lab Results: Results for orders placed during the hospital encounter of 10/02/11 (from the past 24 hour(s))  TROPONIN I     Status: Normal   Collection Time   10/03/11 11:50 AM      Component Value Range   Troponin I <0.30  <0.30 ng/mL  TSH     Status: Normal   Collection Time   10/03/11  4:00 PM      Component Value Range   TSH 1.572  0.350 - 4.500 uIU/mL  BASIC METABOLIC PANEL     Status: Abnormal   Collection Time   10/04/11  4:55 AM      Component Value Range   Sodium 138  135 - 145 mEq/L   Potassium 3.6  3.5 - 5.1 mEq/L   Chloride 101  96 - 112 mEq/L   CO2 24  19 - 32 mEq/L   Glucose, Bld 82  70 - 99 mg/dL   BUN 61 (*) 6 - 23 mg/dL   Creatinine, Ser 4.13 (*) 0.50 - 1.35 mg/dL   Calcium 8.9  8.4 - 24.4 mg/dL   GFR calc non Af Amer 21 (*) >90 mL/min   GFR calc Af Amer 24 (*) >90 mL/min  CBC     Status: Abnormal   Collection Time   10/04/11  4:55 AM      Component Value Range   WBC 11.2 (*) 4.0 - 10.5 K/uL   RBC 3.44 (*) 4.22 - 5.81 MIL/uL   Hemoglobin 9.0 (*) 13.0 - 17.0 g/dL   HCT 01.0 (*) 27.2 - 53.6 %   MCV 81.4  78.0 - 100.0 fL   MCH 26.2  26.0 - 34.0  pg   MCHC 32.1  30.0 - 36.0 g/dL   RDW 64.4 (*) 03.4 - 74.2 %   Platelets 176  150 - 400 K/uL  MAGNESIUM     Status: Normal   Collection Time   10/04/11  4:55 AM      Component Value Range   Magnesium 1.6  1.5 - 2.5 mg/dL    Studies/Results: @RISRSLT24 @  Medications: Reviewed    Patient Active Hospital Problem List: Acute on chronic systolic heart failure (10/03/2011)  Etiol unclear.  Myoview today.  Cath only if high risk given renal status.  Hydralzine and Imdur added  Can increase to 50 tid.  Follow   TSH is normal  Continue lasix IV CXR yesterday with pulmonary edema.  Atrial flutter.    Remains in SR on amiodarone.  Currently n ASA  Renal insuff  Renal service is following.  Continues on IV Lasix  Will not make change but Cr is increase to 3.11.  Limiting.  Anemia  Stable.   LOS: 2 days   Dietrich Pates 10/04/2011, 8:41 AM

## 2011-10-04 NOTE — Progress Notes (Signed)
[name] PROGRESS NOTE  Chad [name] ZOX:[id number] DOB: 09-27-1955 DOA: 10/02/2011 PCP: [name], MD  Assessment/Plan:  Acute systolic CHF  -Nuclear Myoview negative for reversible ischemia, EF 42% -Echocardiogram shows ejection fraction 30-35%, severe pulmonary hypertension  -Neg 1021 cc in the last 24 hours, -3156cc total for the admission -Appreciate cardiology followup Peripheral edema -Severe pulmonary hypertension/cor pulmonale likely contributing to the patient's peripheral edema -proteinuria likely contributing to peripheral edema -Spot urine protein/urine creatinine ratio Pulmonary hypertension  -Likely due to left-sided heart failure  Hypertension  -Improving with the addition of hydralazine, Imdur, increasing carvedilol -Out of Coreg for the past 2 days prior to admission. This may have resulted in his CHF decompensation  Leukocytosis  -Given the patient's renal transplant, immunocompromised status and patchy infiltrates, cannot rule out pneumonia, particularly atypical organisms  -Blood cultures negative, WBC decreasing, continue vancomycin and Zosyn at this time  -Repeat chest x-ray today to reevaluate infiltrates  -Discontinue Levaquin, start cefepime and doxycycline  Upper back pain  -Suspect a musculoskeletal issue given the patient's kyphotic posture  -No visible rash on examination -Zanaflex at bedtime  Status post renal transplant  -Continue sirolimus  History of atrial fibrillation/atrial flutter  -Currently in sinus rhythm  -Continue amiodarone  Active Problems:  Acute on chronic systolic heart failure   Procedures/Studies: Dg Chest 2 View  10/03/2011  *RADIOLOGY REPORT*  Clinical Data: Evaluate for pulmonary infiltrates and edema. Hypertension.  CHEST - 2 VIEW  Comparison: Chest x-ray 10/02/2011.  Findings: Lung volumes are low.  There are some linear opacities throughout the mid and lower lungs bilaterally, which likely reflect a  combination of chronic scarring and subsegmental atelectasis.  There is some mild pulmonary venous congestion, without frank pulmonary edema.  Trace bilateral pleural effusions (left greater than right) appeared be chronic and are unchanged; alternatively, this could represent chronic pleuroparenchymal scarring.  Mild cardiomegaly is unchanged. The patient is rotated to the right on today's exam, resulting in distortion of the mediastinal contours and reduced diagnostic sensitivity and specificity for mediastinal pathology.  Atherosclerosis in the thoracic aorta.  IMPRESSION: 1.  Mild cardiomegaly with pulmonary venous congestion, but no frank pulmonary edema at this time. 2.  Extensive subsegmental atelectasis and/or scarring throughout the mid and lower lungs bilaterally. 3.  Trace bilateral pleural effusions (left greater than right) versus chronic pleuroparenchymal scarring.   Original Report Authenticated By: Florencia Reasons, M.D.    Dg Chest 2 View  10/02/2011  *RADIOLOGY REPORT*  Clinical Data: Chest pain, rib pain, shortness of breath  CHEST - 2 VIEW  Comparison: 06/22/2011  Findings: Cardiomegaly noted with increased vascular and interstitial prominence concerning for early edema.  No significant effusion.  No pneumothorax.  Rib deformities on the right from prior fracture.  Trachea midline.  IMPRESSION: Cardiomegaly with early interstitial edema.   Original Report Authenticated By: Judie Petit. [name], M.D.    Dg Ribs Unilateral Left  10/03/2011  *RADIOLOGY REPORT*  Clinical Data: Left posterior chest/rib pain, known injury  LEFT RIBS - 2 VIEW  Comparison: Chest radiograph 10/02/2011  Findings: Enlargement of cardiac silhouette with pulmonary vascular congestion. Mild diffuse pulmonary infiltrate/edema. Bones appear demineralized. No rib fracture or bone destruction identified.  IMPRESSION: No definite rib abnormalities. Enlargement of cardiac silhouette with pulmonary vascular congestion and pulmonary  edema.   Original Report Authenticated By: Lollie Marrow, M.D.    Nm Myocar Multi W/spect W/wall Motion / Ef  10/04/2011  *RADIOLOGY REPORT*  Clinical Data:  CHF, history  of A-flutter  MYOCARDIAL IMAGING WITH SPECT (REST AND PHARMACOLOGIC-STRESS) GATED LEFT VENTRICULAR WALL MOTION STUDY LEFT VENTRICULAR EJECTION FRACTION  Technique:  Standard myocardial SPECT imaging was performed after resting intravenous injection of 11 mCi Tc-97m sestamibi. Subsequently, intravenous infusion of lexiscan was performed under the supervision of the Cardiology staff.  At peak effect of the drug, 33 mCi Tc-69m sestamibi was injected intravenously and standard myocardial SPECT  imaging was performed.  Quantitative gated imaging was also performed to evaluate left ventricular wall motion, and estimate left ventricular ejection fraction.  Comparison:  Chest radiograph - 10/03/2011  Findings:  Review of the rotational raw images demonstrates mild GI activity and motion which appears grossly symmetric on the provided rest and stress images.  SPECT imaging demonstrates mild attenuation involving the apex and inferior wall of the left ventricle.   No scintigraphic evidence of prior infarction or pharmacologically induced ischemia.  The left ventricular cavity appears mildly dilated.  Quantitative gated analysis shows global hypokinesia.  The resting left ventricular ejection fraction is 42% with end- diastolic volume of 198 ml and end-systolic volume of 114 ml.  IMPRESSION: 1.  No scintigraphic evidence of prior infarction or pharmacologically induced ischemia. 2.  Mildly dilated left ventricle with mild global hypokinesia. Ejection fraction - 42%.   Original Report Authenticated By: Waynard Reeds, M.D.      Antibiotics:  Vancomycin September 5>>>  Cefepime September 6>>>  Doxycycline September 6>>>  Levaquin September 5>>> September 6   Code Status: Full  Disposition Plan: Home when medically  stable  Subjective: Patient states that he is breathing back to baseline. He denies any chest pain, shortness breath, nausea, vomiting, diarrhea, abdominal pain, dizziness, headache.  Objective: Filed Vitals:   10/04/11 1101 10/04/11 1103 10/04/11 1105 10/04/11 1403  BP: 143/93 139/94 135/94 131/84  Pulse:    90  Temp:    97.4 F (36.3 C)  TempSrc:    Oral  Resp:    20  Height:      Weight:      SpO2:    100%    Intake/Output Summary (Last 24 hours) at 10/04/11 1427 Last data filed at 10/04/11 1300  Gross per 24 hour  Intake   1154 ml  Output   2075 ml  Net   -921 ml   Weight change: -2.351 kg (-5 lb 2.9 oz) Exam:   General:  Pt is alert, follows commands appropriately, not in acute distress  HEENT: No icterus, No thrush,  [place]/AT  Cardiovascular: Regular rate and rhythm, S1/S2, no rubs, no gallops  Respiratory: Left basilar crackles, right clinical auscultation. No wheezes or rhonchi  Abdomen: Soft, non tender, non distended, bowel sounds present, no guarding  Extremities: 2+ edema, No lymphangitis, No petechiae, No rashes, no synovitis, bilateral lower extremity is wrapped in [place] boots  Data Reviewed: Basic Metabolic Panel:  Lab 10/04/11 4098 10/03/11 0130 10/02/11 1328  NA 138 135 130*  K 3.6 3.5 3.7  CL 101 99 94*  CO2 24 22 21   GLUCOSE 82 90 111*  BUN 61* 51* 49*  CREATININE 3.11* 2.90* 2.95*  CALCIUM 8.9 9.0 9.3  MG 1.6 -- --  PHOS -- -- --   Liver Function Tests:  Lab 10/03/11 0130 10/02/11 1328  AST 40* 43*  ALT 14 14  ALKPHOS 46 52  BILITOT 0.7 0.8  PROT 7.4 8.2  ALBUMIN 2.7* 3.1*   No results found for this basename: LIPASE:5,AMYLASE:5 in the last 168 hours No results  found for this basename: AMMONIA:5 in the last 168 hours CBC:  Lab 10/04/11 0455 10/03/11 0130 10/02/11 1328  WBC 11.2* 14.6* 18.0*  NEUTROABS -- 10.5* 14.6*  HGB 9.0* 9.3* 10.2*  HCT 28.0* 28.5* 31.3*  MCV 81.4 80.7 81.3  PLT 176 169 197   Cardiac Enzymes:  Lab  10/03/11 1150 10/03/11 0130 10/02/11 1938 10/02/11 1730  CKTOTAL -- -- -- --  CKMB -- -- -- --  CKMBINDEX -- -- -- --  TROPONINI <0.30 <0.30 <0.30 <0.30   BNP: No components found with this basename: POCBNP:5 CBG: No results found for this basename: GLUCAP:5 in the last 168 hours  Recent Results (from the past 240 hour(s))  URINE CULTURE     Status: Normal   Collection Time   10/02/11  1:33 PM      Component Value Range Status Comment   Specimen Description URINE, CLEAN CATCH   Final    Special Requests NONE   Final    Culture  Setup Time 10/02/2011 15:17   Final    Colony Count NO GROWTH   Final    Culture NO GROWTH   Final    Report Status 10/03/2011 FINAL   Final   CULTURE, BLOOD (ROUTINE X 2)     Status: Normal (Preliminary result)   Collection Time   10/02/11  7:38 PM      Component Value Range Status Comment   Specimen Description BLOOD ARM RIGHT   Final    Special Requests BOTTLES DRAWN AEROBIC AND ANAEROBIC 10CC   Final    Culture  Setup Time 10/03/2011 01:00   Final    Culture     Final    Value:        BLOOD CULTURE RECEIVED NO GROWTH TO DATE CULTURE WILL BE HELD FOR 5 DAYS BEFORE ISSUING A FINAL NEGATIVE REPORT   Report Status PENDING   Incomplete   CULTURE, BLOOD (ROUTINE X 2)     Status: Normal (Preliminary result)   Collection Time   10/02/11  7:45 PM      Component Value Range Status Comment   Specimen Description BLOOD HAND RIGHT   Final    Special Requests BOTTLES DRAWN AEROBIC ONLY 10CC   Final    Culture  Setup Time 10/03/2011 01:00   Final    Culture     Final    Value:        BLOOD CULTURE RECEIVED NO GROWTH TO DATE CULTURE WILL BE HELD FOR 5 DAYS BEFORE ISSUING A FINAL NEGATIVE REPORT   Report Status PENDING   Incomplete   MRSA PCR SCREENING     Status: Normal   Collection Time   10/02/11 11:18 PM      Component Value Range Status Comment   MRSA by PCR NEGATIVE  NEGATIVE Final      Scheduled Meds:   . amiodarone  200 mg Oral Daily  . antiseptic oral  rinse  15 mL Mouth Rinse BID  . aspirin  81 mg Oral Daily  . calcitRIOL  0.25 mcg Oral Daily  . carvedilol  6.25 mg Oral BID WC  . ceFEPime (MAXIPIME) IV  2 g Intravenous Q24H  . darbepoetin (ARANESP) injection - NON-DIALYSIS  60 mcg Subcutaneous Q Fri-1800  . doxycycline  100 mg Oral Q12H  . famotidine  20 mg Oral BID  . heparin  5,000 Units Subcutaneous Q8H  . hydrALAZINE  50 mg Oral TID BM  . isosorbide mononitrate  30 mg  Oral Daily  . pantoprazole  40 mg Oral BID AC  . potassium chloride  20 mEq Oral Daily  . predniSONE  10 mg Oral Q breakfast  . regadenoson  0.4 mg Intravenous Once  . simvastatin  40 mg Oral QPM  . sirolimus  1.5 mg Oral Daily  . sodium chloride  3 mL Intravenous Q12H  . vancomycin  1,500 mg Intravenous Q48H  . DISCONTD: carvedilol  3.125 mg Oral BID WC  . DISCONTD: furosemide  80 mg Intravenous Q12H  . DISCONTD: hydrALAZINE  25 mg Oral q12n4p  . DISCONTD: hydrALAZINE  25 mg Oral TID BM  . DISCONTD: levofloxacin  750 mg Oral Q48H  . DISCONTD: NONFORMULARY OR COMPOUNDED ITEM 1.5 mg  1.5 mg Oral Daily  . DISCONTD: sirolimus  1.5 mg Oral Daily  . DISCONTD: sirolimus  1.5 mg Oral Daily  . DISCONTD: sirolimus  1.5 mg Oral Once   Continuous Infusions:    Atiyah Bauer, DO  [name] Pager [phone]  If 7PM-7AM, please contact night-coverage [email or web address] Password [email or web address] 10/04/2011, 2:27 PM   LOS: 2 days

## 2011-10-04 NOTE — Discharge Summary (Addendum)
Physician Discharge Summary  Chad [name] ZOX:[id number] DOB: 08-17-1955 DOA: 10/02/2011  PCP: [name], MD  Admit date: 10/02/2011 Discharge date: 10/05/2011  Recommendations for Outpatient Follow-up:  1. Pt will need to follow up with PCP in 2-3 weeks post discharge 2. Please obtain BMP to evaluate electrolytes and kidney function 3. Please also check CBC to evaluate Hg and Hct levels  Discharge Diagnoses:  Active Problems:  Acute on chronic systolic heart failure Acute systolic CHF  -Nuclear Myoview negative for reversible ischemia, EF 42%  -Echocardiogram shows ejection fraction 30-35%, severe pulmonary hypertension  - -3106cc total for the admission  -Continue carvedilol, hydralazine, Imdur, and Lasix at home. -Patient's proBNP improved from greater than 70,000 to 25,000 but the patient was clinically much improved and able to perform activities of daily living without dyspnea on exertion -Spoke with Dr. Huston Foley whom stated the patient came to her home with outpatient followup and ultimate right heart catheterization Peripheral edema  -Severe pulmonary hypertension/cor pulmonale likely contributing to the patient's peripheral edema  -proteinuria likely contributing to peripheral edema  Pulmonary hypertension  -Likely due to left-sided heart failure  Hypertension  -Improving with the addition of hydralazine, Imdur, increasing carvedilol  -Out of Coreg for the past 2 days prior to admission. This may have resulted in his CHF decompensation  Leukocytosis  -Given the patient's renal transplant, immunocompromised status and patchy infiltrates, cannot rule out pneumonia, particularly atypical organisms  -due to the patient's amiodarone which can cause  QT prolongation, antibiotic choices limited; will send patient home on Omnicef and doxycycline Upper back pain  -Suspect a musculoskeletal issue given the patient's kyphotic posture  -No visible rash on examination  -Zanaflex at  bedtime  Status post renal transplant  -Continue sirolimus  History of atrial fibrillation/atrial flutter  -Currently in sinus rhythm  -Continue amiodarone   Discharge Condition: Stable  Disposition:  discharge home  Diet: Cardiac Wt Readings from Last 3 Encounters:  10/05/11 108.2 kg (238 lb 8.6 oz)  08/29/11 112.9 kg (248 lb 14.4 oz)  07/15/11 123.3 kg (271 lb 13.2 oz)    History of present illness:  56 year old male with history of renal transplant presented initially to the ED because of left upper thoracic back pain. Upon evaluation he was found to be hypoxemic. The patient complained of a cough for 2-3 weeks without any chest pain,  orthopnea, or PND. He did complain of some minimal shortness of breath on exertion. Chest x-ray revealed pulmonary edema with questionable scattered patchy infiltrates.  Hospital Course:  The patient was admitted to the hospital and treated for acute systolic CHF. His previous echocardiogram on 06/25/2011 showed an ejection fraction 25-30%. This was thought to be due to stunned myocardium from his ARDS and sepsis at that time. The patient was started on intravenous furosemide with improvement of his symptoms. The patient has history of CKD stage IV, but his serum creatinine was actually better than at 08/30/2011. His initial serum creatinine was 2.95 on the time of admission. The patient was subsequently transitioned to oral furosemide. EKGs showed some T-wave inversions in V1 to V3. Because of unclear etiology of his cardiomyopathy and his EKG changes, cardiology was consulted. They recommended a nuclear Myoview stress test. It did not reveal any inducible ischemia. It does show an ejection fraction of 42%. They recommended continuing amiodarone for his atrial fibrillation history. He was continued on aspirin 81 mg daily. Echocardiogram during this admission revealed ejection fraction 30-35%, severe hypokinesis of the inferior  myocardium with severe  pulmonary hypertension. The patient's dose of carvedilol was increased to 6.25 mg twice a day and hydralazine and Imdur was added to his regimen. This will be continued. The patient's serum creatinine remains stable on 40 mg of by mouth furosemide. He will continue this as an outpatient. The patient states that he does primary care physician. I encouraged him to find one.  At the time of admission, the patient also had scattered patch infiltrates on chest x-ray with a white blood cell count of 18,000. He was initially started on vancomycin, Levaquin, but the Levaquin was discontinued due to the patient's QT prolongation. Subsequently the patient was continued on vancomycin with the addition of cefepime and doxycycline. His white blood cell count continued to decrease and he clinically improved. He remained afebrile and hemodynamically stable. The patient was maintained on his prednisone 10 mg daily and sirolimus. Nephrology did initially see the patient but signed off when the patient stabilized. Repeat chest x-ray showed improving pulmonary vascular congestion with persistent atelectasis and patchy infiltrates. He'll be discharged home with an additional 7 days of antibiotics. Due to the patient's concomitant use of amiodarone which can cause QT prolongation, antibiotic choices are limited particularly at the time of discharge. He'll be sent home with Omnicef and doxycycline.  Wound care was also provided for his lymphedema. His Unna boot was changed during this admission.  Consultants: Cardiology Nephrology  Discharge Exam: Filed Vitals:   10/05/11 0554  BP: 140/85  Pulse: 86  Temp: 97.7 F (36.5 C)  Resp: 20   Filed Vitals:   10/04/11 1105 10/04/11 1403 10/04/11 2256 10/05/11 0554  BP: 135/94 131/84 133/80 140/85  Pulse:  90 87 86  Temp:  97.4 F (36.3 C) 98 F (36.7 C) 97.7 F (36.5 C)  TempSrc:  Oral Oral Oral  Resp:  20 18 20   Height:      Weight:    108.2 kg (238 lb 8.6 oz)  SpO2:   100% 96% 96%   General: A&O x 3, NAD, pleasant, cooperative Cardiovascular: RRR, no rub, no gallop, no S3 Respiratory: CTAB, no wheeze, no rhonchi Abdomen:soft, nontender, nondistended, positive bowel sounds Extremities: Bilateral lower extremities in an Unna boots, 2+ edema. Feet without any open wounds.  Discharge Instructions  Discharge Orders    Future Orders Please Complete By Expires   Diet - low sodium heart healthy      Increase activity slowly      Discharge instructions      Comments:   Doreatha Martin all antibiotics even if you are feeling better     Medication List  As of 10/05/2011 12:14 PM   TAKE these medications         amiodarone 200 MG tablet   Commonly known as: PACERONE   Take 200 mg by mouth daily.      aspirin 81 MG chewable tablet   Chew 1 tablet (81 mg total) by mouth daily.      calcitRIOL 0.25 MCG capsule   Commonly known as: ROCALTROL   Take 0.25 mcg by mouth daily.      camphor-menthol lotion   Commonly known as: SARNA   Apply topically as needed for itching.      carvedilol 6.25 MG tablet   Commonly known as: COREG   Take 1 tablet (6.25 mg total) by mouth 2 (two) times daily with a meal.      cefdinir 300 MG capsule   Commonly known as: OMNICEF  Take 1 capsule (300 mg total) by mouth 2 (two) times daily.      doxycycline 100 MG tablet   Commonly known as: VIBRA-TABS   Take 1 tablet (100 mg total) by mouth every 12 (twelve) hours.      famotidine 20 MG tablet   Commonly known as: PEPCID   Take 1 tablet (20 mg total) by mouth daily.      furosemide 40 MG tablet   Commonly known as: LASIX   Take 40 mg by mouth 2 (two) times daily.      furosemide 40 MG tablet   Commonly known as: LASIX   Take 1 tablet (40 mg total) by mouth 2 (two) times daily.      hydrALAZINE 50 MG tablet   Commonly known as: APRESOLINE   Take 1 tablet (50 mg total) by mouth 3 (three) times daily between meals.      HYDROcodone-acetaminophen 5-325 MG per tablet    Commonly known as: NORCO/VICODIN   Take 1-2 tablets by mouth every 4 (four) hours as needed. For pain      isosorbide mononitrate 30 MG 24 hr tablet   Commonly known as: IMDUR   Take 1 tablet (30 mg total) by mouth daily.      methocarbamol 500 MG tablet   Commonly known as: ROBAXIN   Take 1 tablet (500 mg total) by mouth 2 (two) times daily.      oxyCODONE-acetaminophen 5-325 MG per tablet   Commonly known as: PERCOCET/ROXICET   Take 2 tablets by mouth every 4 (four) hours as needed for pain.      pantoprazole 40 MG tablet   Commonly known as: PROTONIX   Take 40 mg by mouth 2 (two) times daily before a meal.      potassium chloride 10 MEQ tablet   Commonly known as: K-DUR,KLOR-CON   Take 10 mEq by mouth daily.      predniSONE 10 MG tablet   Commonly known as: DELTASONE   Take 10 mg by mouth daily.      simvastatin 40 MG tablet   Commonly known as: ZOCOR   Take 40 mg by mouth every evening.      sirolimus 1 MG/ML solution   Commonly known as: RAPAMUNE   Take 1.5 mg by mouth daily.      tiZANidine 2 MG tablet   Commonly known as: ZANAFLEX   Take 1 tablet (2 mg total) by mouth at bedtime.      valACYclovir 1000 MG tablet   Commonly known as: VALTREX   Take 1 tablet (1,000 mg total) by mouth 3 (three) times daily.             The results of significant diagnostics from this hospitalization (including imaging, microbiology, ancillary and laboratory) are listed below for reference.    Significant Diagnostic Studies: Dg Chest 2 View  10/03/2011  *RADIOLOGY REPORT*  Clinical Data: Evaluate for pulmonary infiltrates and edema. Hypertension.  CHEST - 2 VIEW  Comparison: Chest x-ray 10/02/2011.  Findings: Lung volumes are low.  There are some linear opacities throughout the mid and lower lungs bilaterally, which likely reflect a combination of chronic scarring and subsegmental atelectasis.  There is some mild pulmonary venous congestion, without frank pulmonary edema.  Trace  bilateral pleural effusions (left greater than right) appeared be chronic and are unchanged; alternatively, this could represent chronic pleuroparenchymal scarring.  Mild cardiomegaly is unchanged. The patient is rotated to the right on today's exam, resulting  in distortion of the mediastinal contours and reduced diagnostic sensitivity and specificity for mediastinal pathology.  Atherosclerosis in the thoracic aorta.  IMPRESSION: 1.  Mild cardiomegaly with pulmonary venous congestion, but no frank pulmonary edema at this time. 2.  Extensive subsegmental atelectasis and/or scarring throughout the mid and lower lungs bilaterally. 3.  Trace bilateral pleural effusions (left greater than right) versus chronic pleuroparenchymal scarring.   Original Report Authenticated By: Florencia Reasons, M.D.    Dg Chest 2 View  10/02/2011  *RADIOLOGY REPORT*  Clinical Data: Chest pain, rib pain, shortness of breath  CHEST - 2 VIEW  Comparison: 06/22/2011  Findings: Cardiomegaly noted with increased vascular and interstitial prominence concerning for early edema.  No significant effusion.  No pneumothorax.  Rib deformities on the right from prior fracture.  Trachea midline.  IMPRESSION: Cardiomegaly with early interstitial edema.   Original Report Authenticated By: Judie Petit. [name], M.D.    Dg Ribs Unilateral Left  10/03/2011  *RADIOLOGY REPORT*  Clinical Data: Left posterior chest/rib pain, known injury  LEFT RIBS - 2 VIEW  Comparison: Chest radiograph 10/02/2011  Findings: Enlargement of cardiac silhouette with pulmonary vascular congestion. Mild diffuse pulmonary infiltrate/edema. Bones appear demineralized. No rib fracture or bone destruction identified.  IMPRESSION: No definite rib abnormalities. Enlargement of cardiac silhouette with pulmonary vascular congestion and pulmonary edema.   Original Report Authenticated By: Lollie Marrow, M.D.    Nm Myocar Multi W/spect W/wall Motion / Ef  10/04/2011  *RADIOLOGY REPORT*   Clinical Data:  CHF, history of A-flutter  MYOCARDIAL IMAGING WITH SPECT (REST AND PHARMACOLOGIC-STRESS) GATED LEFT VENTRICULAR WALL MOTION STUDY LEFT VENTRICULAR EJECTION FRACTION  Technique:  Standard myocardial SPECT imaging was performed after resting intravenous injection of 11 mCi Tc-81m sestamibi. Subsequently, intravenous infusion of lexiscan was performed under the supervision of the Cardiology staff.  At peak effect of the drug, 33 mCi Tc-43m sestamibi was injected intravenously and standard myocardial SPECT  imaging was performed.  Quantitative gated imaging was also performed to evaluate left ventricular wall motion, and estimate left ventricular ejection fraction.  Comparison:  Chest radiograph - 10/03/2011  Findings:  Review of the rotational raw images demonstrates mild GI activity and motion which appears grossly symmetric on the provided rest and stress images.  SPECT imaging demonstrates mild attenuation involving the apex and inferior wall of the left ventricle.   No scintigraphic evidence of prior infarction or pharmacologically induced ischemia.  The left ventricular cavity appears mildly dilated.  Quantitative gated analysis shows global hypokinesia.  The resting left ventricular ejection fraction is 42% with end- diastolic volume of 198 ml and end-systolic volume of 114 ml.  IMPRESSION: 1.  No scintigraphic evidence of prior infarction or pharmacologically induced ischemia. 2.  Mildly dilated left ventricle with mild global hypokinesia. Ejection fraction - 42%.   Original Report Authenticated By: Waynard Reeds, M.D.      Microbiology: Recent Results (from the past 240 hour(s))  URINE CULTURE     Status: Normal   Collection Time   10/02/11  1:33 PM      Component Value Range Status Comment   Specimen Description URINE, CLEAN CATCH   Final    Special Requests NONE   Final    Culture  Setup Time 10/02/2011 15:17   Final    Colony Count NO GROWTH   Final    Culture NO GROWTH   Final     Report Status 10/03/2011 FINAL   Final   CULTURE, BLOOD (  ROUTINE X 2)     Status: Normal (Preliminary result)   Collection Time   10/02/11  7:38 PM      Component Value Range Status Comment   Specimen Description BLOOD ARM RIGHT   Final    Special Requests BOTTLES DRAWN AEROBIC AND ANAEROBIC 10CC   Final    Culture  Setup Time 10/03/2011 01:00   Final    Culture     Final    Value:        BLOOD CULTURE RECEIVED NO GROWTH TO DATE CULTURE WILL BE HELD FOR 5 DAYS BEFORE ISSUING A FINAL NEGATIVE REPORT   Report Status PENDING   Incomplete   CULTURE, BLOOD (ROUTINE X 2)     Status: Normal (Preliminary result)   Collection Time   10/02/11  7:45 PM      Component Value Range Status Comment   Specimen Description BLOOD HAND RIGHT   Final    Special Requests BOTTLES DRAWN AEROBIC ONLY 10CC   Final    Culture  Setup Time 10/03/2011 01:00   Final    Culture     Final    Value:        BLOOD CULTURE RECEIVED NO GROWTH TO DATE CULTURE WILL BE HELD FOR 5 DAYS BEFORE ISSUING A FINAL NEGATIVE REPORT   Report Status PENDING   Incomplete   MRSA PCR SCREENING     Status: Normal   Collection Time   10/02/11 11:18 PM      Component Value Range Status Comment   MRSA by PCR NEGATIVE  NEGATIVE Final      Labs: Basic Metabolic Panel:  Lab 10/05/11 1610 10/05/11 0723 10/04/11 0455 10/03/11 0130 10/02/11 1328  NA 134* 137 138 135 130*  K 4.2 3.9 -- -- --  CL 100 101 101 99 94*  CO2 18* 20 24 22 21   GLUCOSE 94 95 82 90 111*  BUN 64* 65* 61* 51* 49*  CREATININE 3.12* 3.14* 3.11* 2.90* 2.95*  CALCIUM 9.3 8.8 8.9 9.0 9.3  MG -- -- 1.6 -- --  PHOS -- -- -- -- --   Liver Function Tests:  Lab 10/03/11 0130 10/02/11 1328  AST 40* 43*  ALT 14 14  ALKPHOS 46 52  BILITOT 0.7 0.8  PROT 7.4 8.2  ALBUMIN 2.7* 3.1*   No results found for this basename: LIPASE:5,AMYLASE:5 in the last 168 hours No results found for this basename: AMMONIA:5 in the last 168 hours CBC:  Lab 10/05/11 0723 10/04/11 0455 10/03/11  0130 10/02/11 1328  WBC 10.4 11.2* 14.6* 18.0*  NEUTROABS -- -- 10.5* 14.6*  HGB 9.5* 9.0* 9.3* 10.2*  HCT 29.4* 28.0* 28.5* 31.3*  MCV 81.2 81.4 80.7 81.3  PLT 192 176 169 197   Cardiac Enzymes:  Lab 10/03/11 1150 10/03/11 0130 10/02/11 1938 10/02/11 1730  CKTOTAL -- -- -- --  CKMB -- -- -- --  CKMBINDEX -- -- -- --  TROPONINI <0.30 <0.30 <0.30 <0.30   BNP: No components found with this basename: POCBNP:5 CBG: No results found for this basename: GLUCAP:5 in the last 168 hours  Time coordinating discharge:  Greater than 30 minutes  Signed:  Christohper Dube, DO [name] Pager: [phone] 10/05/2011, 12:14 PM

## 2011-10-04 NOTE — Progress Notes (Signed)
Lexiscan MV performed 

## 2011-10-05 DIAGNOSIS — M549 Dorsalgia, unspecified: Secondary | ICD-10-CM

## 2011-10-05 LAB — PRO B NATRIURETIC PEPTIDE: Pro B Natriuretic peptide (BNP): 25083 pg/mL — ABNORMAL HIGH (ref 0–125)

## 2011-10-05 LAB — CBC
HCT: 29.4 % — ABNORMAL LOW (ref 39.0–52.0)
MCV: 81.2 fL (ref 78.0–100.0)
Platelets: 192 10*3/uL (ref 150–400)
RBC: 3.62 MIL/uL — ABNORMAL LOW (ref 4.22–5.81)
WBC: 10.4 10*3/uL (ref 4.0–10.5)

## 2011-10-05 LAB — BASIC METABOLIC PANEL
CO2: 20 mEq/L (ref 19–32)
CO2: 18 mEq/L — ABNORMAL LOW (ref 19–32)
Calcium: 8.8 mg/dL (ref 8.4–10.5)
Chloride: 101 mEq/L (ref 96–112)
Chloride: 100 mEq/L (ref 96–112)
Creatinine, Ser: 3.12 mg/dL — ABNORMAL HIGH (ref 0.50–1.35)
Sodium: 137 mEq/L (ref 135–145)

## 2011-10-05 MED ORDER — CAMPHOR-MENTHOL 0.5-0.5 % EX LOTN: TOPICAL_LOTION | CUTANEOUS | Status: DC | PRN

## 2011-10-05 MED ORDER — CARVEDILOL 6.25 MG PO TABS: 6.2500 mg | ORAL_TABLET | Freq: Two times a day (BID) | ORAL | Status: DC

## 2011-10-05 MED ORDER — DOXYCYCLINE HYCLATE 100 MG PO TABS: 100.0000 mg | ORAL_TABLET | Freq: Two times a day (BID) | ORAL | Status: AC

## 2011-10-05 MED ORDER — ISOSORBIDE MONONITRATE ER 30 MG PO TB24: 30.0000 mg | ORAL_TABLET | Freq: Every day | ORAL | Status: DC

## 2011-10-05 MED ORDER — TIZANIDINE HCL 2 MG PO TABS: 2.0000 mg | ORAL_TABLET | Freq: Every day | ORAL | Status: AC

## 2011-10-05 MED ORDER — FUROSEMIDE 40 MG PO TABS: 40.0000 mg | ORAL_TABLET | Freq: Two times a day (BID) | ORAL | Status: DC

## 2011-10-05 MED ORDER — ASPIRIN 81 MG PO CHEW: 81.0000 mg | CHEWABLE_TABLET | Freq: Every day | ORAL | Status: DC

## 2011-10-05 MED ORDER — FAMOTIDINE 20 MG PO TABS: 20.0000 mg | ORAL_TABLET | Freq: Every day | ORAL | Status: DC

## 2011-10-05 MED ORDER — CEFDINIR 300 MG PO CAPS: 300.0000 mg | ORAL_CAPSULE | Freq: Two times a day (BID) | ORAL | Status: AC

## 2011-10-05 MED ORDER — HYDRALAZINE HCL 50 MG PO TABS: 50.0000 mg | ORAL_TABLET | Freq: Three times a day (TID) | ORAL | Status: DC

## 2011-10-05 NOTE — Progress Notes (Signed)
Subjective: Denies SOB  No CP  Was up and moving around. Objective: Filed Vitals:   10/04/11 1105 10/04/11 1403 10/04/11 2256 10/05/11 0554  BP: 135/94 131/84 133/80 140/85  Pulse:  90 87 86  Temp:  97.4 F (36.3 C) 98 F (36.7 C) 97.7 F (36.5 C)  TempSrc:  Oral Oral Oral  Resp:  20 18 20   Height:      Weight:    238 lb 8.6 oz (108.2 kg)  SpO2:  100% 96% 96%   Weight change: 3.5 oz (0.1 kg)  Intake/Output Summary (Last 24 hours) at 10/05/11 0829 Last data filed at 10/05/11 0601  Gross per 24 hour  Intake   1026 ml  Output   1950 ml  Net   -924 ml   Net:   Negative 3280 cc  General: Alert, awake, oriented x3, in no acute distress Neck:  JVP is normal Heart: Regular rate and rhythm, without murmurs, rubs, gallops.  Lungs: Clear to auscultation.  No rales or wheezes. Exemities:  No edema.   Neuro: Grossly intact, nonfocal.  Tele:  SR  80s Lab Results: Results for orders placed during the hospital encounter of 10/02/11 (from the past 24 hour(s))  CBC     Status: Abnormal   Collection Time   10/05/11  7:23 AM      Component Value Range   WBC 10.4  4.0 - 10.5 K/uL   RBC 3.62 (*) 4.22 - 5.81 MIL/uL   Hemoglobin 9.5 (*) 13.0 - 17.0 g/dL   HCT 16.1 (*) 09.6 - 04.5 %   MCV 81.2  78.0 - 100.0 fL   MCH 26.2  26.0 - 34.0 pg   MCHC 32.3  30.0 - 36.0 g/dL   RDW 40.9 (*) 81.1 - 91.4 %   Platelets 192  150 - 400 K/uL    Studies/Results: [employer].  IMPRESSION:  1. No scintigraphic evidence of prior infarction or  pharmacologically induced ischemia.  2. Mildly dilated left ventricle with mild global hypokinesia.  Ejection fraction - 42%.   Medications:  Reviewed.   Patient Active Hospital Problem List: Acute on chronic systolic heart failure (10/03/2011)   Assessment: APpears to be nonischemic  Volume status is improved.  Will recheck labs.  HTN  Improved   Patient says bp high on admit because ran out and had a hard time filling at [facility]  REnal  BMET  ANemia   Stable  Atrial flutter  SR on amiodarone.  ASA.  Closer to d/c  WOuld like to review labs.   LOS: 3 days   Dietrich Pates 10/05/2011, 8:29 AM

## 2011-10-05 NOTE — Progress Notes (Signed)
ANTIBIOTIC CONSULT NOTE - FOLLOW UP  Pharmacy Consult for Vancomycin Indication: pneumonia  No Known Allergies  Patient Measurements: Height: 6\' 1"  (185.4 cm) Weight: 238 lb 8.6 oz (108.2 kg) (scale C) IBW/kg (Calculated) : 79.9   Vital Signs: Temp: 97.7 F (36.5 C) (09/08 0554) Temp src: Oral (09/08 0554) BP: 140/85 mmHg (09/08 0554) Pulse Rate: 86  (09/08 0554) Intake/Output from previous day: 09/07 0701 - 09/08 0700 In: 1146 [P.O.:596; IV Piggyback:550] Out: 1950 [Urine:1950] Intake/Output from this shift:    Labs:  Basename 10/05/11 0723 10/04/11 0455 10/03/11 0130  WBC 10.4 11.2* 14.6*  HGB 9.5* 9.0* 9.3*  PLT 192 176 169  LABCREA -- -- --  CREATININE 3.14* 3.11* 2.90*   Estimated Creatinine Clearance: 34.3 ml/min (by C-G formula based on Cr of 3.14). No results found for this basename: VANCOTROUGH:2,VANCOPEAK:2,VANCORANDOM:2,GENTTROUGH:2,GENTPEAK:2,GENTRANDOM:2,TOBRATROUGH:2,TOBRAPEAK:2,TOBRARND:2,AMIKACINPEAK:2,AMIKACINTROU:2,AMIKACIN:2, in the last 72 hours   Microbiology: Recent Results (from the past 720 hour(s))  URINE CULTURE     Status: Normal   Collection Time   10/02/11  1:33 PM      Component Value Range Status Comment   Specimen Description URINE, CLEAN CATCH   Final    Special Requests NONE   Final    Culture  Setup Time 10/02/2011 15:17   Final    Colony Count NO GROWTH   Final    Culture NO GROWTH   Final    Report Status 10/03/2011 FINAL   Final   CULTURE, BLOOD (ROUTINE X 2)     Status: Normal (Preliminary result)   Collection Time   10/02/11  7:38 PM      Component Value Range Status Comment   Specimen Description BLOOD ARM RIGHT   Final    Special Requests BOTTLES DRAWN AEROBIC AND ANAEROBIC 10CC   Final    Culture  Setup Time 10/03/2011 01:00   Final    Culture     Final    Value:        BLOOD CULTURE RECEIVED NO GROWTH TO DATE CULTURE WILL BE HELD FOR 5 DAYS BEFORE ISSUING A FINAL NEGATIVE REPORT   Report Status PENDING   Incomplete     CULTURE, BLOOD (ROUTINE X 2)     Status: Normal (Preliminary result)   Collection Time   10/02/11  7:45 PM      Component Value Range Status Comment   Specimen Description BLOOD HAND RIGHT   Final    Special Requests BOTTLES DRAWN AEROBIC ONLY 10CC   Final    Culture  Setup Time 10/03/2011 01:00   Final    Culture     Final    Value:        BLOOD CULTURE RECEIVED NO GROWTH TO DATE CULTURE WILL BE HELD FOR 5 DAYS BEFORE ISSUING A FINAL NEGATIVE REPORT   Report Status PENDING   Incomplete   MRSA PCR SCREENING     Status: Normal   Collection Time   10/02/11 11:18 PM      Component Value Range Status Comment   MRSA by PCR NEGATIVE  NEGATIVE Final     Anti-infectives     Start     Dose/Rate Route Frequency Ordered Stop   10/04/11 2000   vancomycin (VANCOCIN) 1,500 mg in sodium chloride 0.9 % 500 mL IVPB        1,500 mg 250 mL/hr over 120 Minutes Intravenous Every 48 hours 10/02/11 2002     10/03/11 2200   doxycycline (VIBRA-TABS) tablet 100 mg  100 mg Oral Every 12 hours 10/03/11 1758     10/03/11 2000   ceFEPIme (MAXIPIME) 2 g in dextrose 5 % 50 mL IVPB        2 g 100 mL/hr over 30 Minutes Intravenous Every 24 hours 10/03/11 1758     10/02/11 2015   levofloxacin (LEVAQUIN) tablet 750 mg  Status:  Discontinued        750 mg Oral Every 48 hours 10/02/11 2003 10/03/11 1758   10/02/11 2015   vancomycin (VANCOCIN) 2,000 mg in sodium chloride 0.9 % 500 mL IVPB        2,000 mg 250 mL/hr over 120 Minutes Intravenous NOW 10/02/11 2002 10/02/11 2215   10/02/11 0000   valACYclovir (VALTREX) 1000 MG tablet        1,000 mg Oral 3 times daily 10/02/11 1239 10/16/11 2359          Assessment: 56 y/o male patient receiving day #3 vancomycin for pneumonia, all cx ngtd. Remains afebrile and wbc trend down to wnl. Renal fxn stable, vanc dose appropriate. Unable to draw trough as patient not at steady state yet, only receiving vanc q48h d/t renal fxn.  Goal of Therapy:  Vancomycin trough  level 15-20 mcg/ml  Plan:  Continue vanc 1500mg  IV q24h and monitor renal fxn. Cefepime dose is appropriate. Measure antibiotic drug levels at steady [address] PharmD, [place] Pager [phone] 10/05/2011,11:01 AM

## 2011-10-05 NOTE — Progress Notes (Signed)
I have seen and examined this patient and agree with the plan of care .  Christabel Camire W 10/05/2011, 10:30 AM

## 2011-10-07 NOTE — ED Provider Notes (Signed)
Medical screening examination/treatment/procedure(s) were conducted as a shared visit with non-physician practitioner(s) and myself.  I personally evaluated the patient during the encounter  [name], MD 10/07/11 [phone]

## 2011-10-08 ENCOUNTER — Encounter (HOSPITAL_BASED_OUTPATIENT_CLINIC_OR_DEPARTMENT_OTHER): Payer: BC Managed Care – PPO

## 2011-10-09 LAB — CULTURE, BLOOD (ROUTINE X 2): Culture: NO GROWTH

## 2011-10-14 ENCOUNTER — Encounter: Payer: Self-pay | Admitting: *Deleted

## 2011-10-16 NOTE — ED Provider Notes (Signed)
Medical screening examination/treatment/procedure(s) were conducted as a shared visit with non-physician practitioner(s) and myself.  I personally evaluated the patient during the encounter  [name], MD 10/16/11 2324

## 2011-10-22 ENCOUNTER — Encounter: Payer: Self-pay | Admitting: Cardiology

## 2011-10-22 ENCOUNTER — Ambulatory Visit (INDEPENDENT_AMBULATORY_CARE_PROVIDER_SITE_OTHER): Payer: BC Managed Care – PPO | Admitting: Cardiology

## 2011-10-22 VITALS — BP 138/84 | HR 68 | Resp 18 | Wt 242.4 lb

## 2011-10-22 DIAGNOSIS — N179 Acute kidney failure, unspecified: Secondary | ICD-10-CM

## 2011-10-22 DIAGNOSIS — I5022 Chronic systolic (congestive) heart failure: Secondary | ICD-10-CM

## 2011-10-22 DIAGNOSIS — I4892 Unspecified atrial flutter: Secondary | ICD-10-CM

## 2011-10-22 DIAGNOSIS — G4730 Sleep apnea, unspecified: Secondary | ICD-10-CM

## 2011-10-22 DIAGNOSIS — I509 Heart failure, unspecified: Secondary | ICD-10-CM

## 2011-10-22 LAB — LIPID PANEL
Cholesterol: 229 mg/dL — ABNORMAL HIGH (ref 0–200)
Triglycerides: 149 mg/dL (ref 0.0–149.0)
VLDL: 29.8 mg/dL (ref 0.0–40.0)

## 2011-10-22 LAB — LDL CHOLESTEROL, DIRECT: Direct LDL: 152.6 mg/dL

## 2011-10-22 LAB — BASIC METABOLIC PANEL
BUN: 64 mg/dL — ABNORMAL HIGH (ref 6–23)
CO2: 24 mEq/L (ref 19–32)
Calcium: 9.1 mg/dL (ref 8.4–10.5)
Chloride: 103 mEq/L (ref 96–112)
Creatinine, Ser: 3.2 mg/dL — ABNORMAL HIGH (ref 0.4–1.5)

## 2011-10-22 LAB — HEPATIC FUNCTION PANEL
ALT: 32 U/L (ref 0–53)
Total Protein: 8 g/dL (ref 6.0–8.3)

## 2011-10-22 MED ORDER — CARVEDILOL 12.5 MG PO TABS: 12.5000 mg | ORAL_TABLET | Freq: Two times a day (BID) | ORAL | Status: DC

## 2011-10-22 NOTE — Progress Notes (Signed)
Patient ID: Chad [name], male   DOB: 04/12/1955, 56 y.o.   MRN: [id number] 56 yo with history of renal transplant, HCV, paroxysmal atrial flutter, and chronic systolic CHF presents to establish outpatient cardiology followup.  He has had a complicated course recently.  In 5/13, he developed parainfluenza PNA that progressed to ARDS.  He had septic shock and was briefly on CVVH.  He went into atrial flutter with RVR (converted back to NSR spontaneously).  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  He went to inpatient rehab until 6/13. In 7/13, he was re-hospitalized with a lower leg wound infection.  Proteus and Serratia grew out.  He is now followed in the wound clinic.  He was admitted again in 9/13 with acute/chronic diastolic CHF.  Repeat echo showed EF 30-35%.  Lexiscan myoview showed EF 42% with no ischemia or infarction.   Patient currently is doing well.  He has chronic lower extremity edema, and both lower legs are wrapped.  No chest pain, orthopnea, or PND.  He has been doing housework and yardwork in his new house.  He is short of breath only with heavy exertion.  He can climb a flight of steps without trouble. He has a history of OSA but does not have a CPAP machine.    Labs (9/13): K 4.2, creatinine 3.12, BNP 25000, TSH normal.   ECG: NSR, LAE, mildly prolonged QTc.   PMH: 1. Atrial flutter in setting of septic shock/PNA in 5/13.  2. Renal transplant 1996.  3. HTN 4. HCV 5. OSA: not currently on CPAP.  6. Parainfluenza PNA with ARDS and septic shock in 5/13.  He required CVVH briefly.   7. Lower leg wound infection 7/13 with Proteus and Serratia.  8. Chronic systolic CHF: Echo (5/13) with EF 15-20%, diffuse hypokinesis.  Repeat echo in (9/13) with EF 30-35%, diffuse hypokinesis worse inferoposteriorly, mild to moderate MR with mild MS, mildly dilated RV with mildly decreased systolic function, PASP 73 mmHg.  Lexiscan myoview (9/13) with EF 42%, diffuse  hypokinesis, no evidence for ischemia or infarction.  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   9. 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   FH: No premature CAD.    ROS: All systems reviewed and negative except as per HPI.   Current Outpatient Prescriptions  Medication Sig Dispense Refill  . amiodarone (PACERONE) 200 MG tablet Take 200 mg by mouth daily.      Marland Kitchen aspirin 81 MG chewable tablet Chew 1 tablet (81 mg total) by mouth daily.      . calcitRIOL (ROCALTROL) 0.25 MCG capsule Take 0.25 mcg by mouth daily.      . camphor-menthol (SARNA) lotion Apply topically as needed for itching.  222 mL    . famotidine (PEPCID) 20 MG tablet Take 1 tablet (20 mg total) by mouth daily.      . furosemide (LASIX) 40 MG tablet Take 40 mg by mouth 2 (two) times daily.      . furosemide (LASIX) 40 MG tablet Take 1 tablet (40 mg total) by mouth 2 (two) times daily.  60 tablet  3  . hydrALAZINE (APRESOLINE) 50 MG tablet Take 1 tablet (50 mg total) by mouth 3 (three) times daily between meals.  90 tablet  3  . HYDROcodone-acetaminophen (NORCO) 5-325 MG per tablet Take 1-2 tablets by mouth every 4 (four) hours as needed. For pain      .  isosorbide mononitrate (IMDUR) 30 MG 24 hr tablet Take 1 tablet (30 mg total) by mouth daily.  30 tablet  3  . pantoprazole (PROTONIX) 40 MG tablet Take 40 mg by mouth 2 (two) times daily before a meal.      . potassium chloride (K-DUR,KLOR-CON) 10 MEQ tablet Take 10 mEq by mouth daily.      . predniSONE (DELTASONE) 10 MG tablet Take 10 mg by mouth daily.      . simvastatin (ZOCOR) 40 MG tablet Take 40 mg by mouth every evening.      . sirolimus (RAPAMUNE) 1 MG/ML solution Take 1.5 mg by mouth daily.      Marland Kitchen DISCONTD: carvedilol (COREG) 6.25 MG tablet Take 1 tablet (6.25 mg total) by mouth 2 (two) times daily with a meal.  60 tablet  3  . carvedilol (COREG) 12.5 MG tablet Take 1 tablet (12.5 mg total) by  mouth 2 (two) times daily.  180 tablet  3    BP 138/84  Pulse 68  Resp 18  Wt 242 lb 6.4 oz (109.952 kg)  SpO2 98% General: NAD, obese Neck: No JVD, no thyromegaly or thyroid nodule.  Lungs: Clear to auscultation bilaterally with normal respiraorty effort. CV: Nondisplaced PMI.  Heart regular S1/S2, no S3/S4, no murmur.  Legs wrapped with at least 1+ edema to knees.  No carotid bruit.    Abdomen: Soft, nontender, no hepatosplenomegaly, no distention.  Neurologic: Alert and oriented x 3.  Psych: Normal affect. Extremities: No clubbing or cyanosis.   Assessment/Plan: 1. Chronic systolic CHF:  EF 30-35% on last echo, 42% by myoview.  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  Myoview showed no ischemia or infarction.  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   - Increase Coreg to 12.5 mg bid.  - Continue Lasix at current dosing.  - Continue hydralazine, Imdur.   - Echo in 12/13 after intensive medical treatment.  If EF is still down, will need to think about ICD.  2. Atrial flutter: Patient has maintained NSR on amiodarone.  He is not on coumadin due to history of GI bleeding.  Continue amiodarone.  He will need LFTs today (TSH checked recently).  He should have a yearly eye exam.  3. OSA: Has OSA but does not have a CPAP machine.  I am going to refer him to pulmonology.   4. Hyperlipidemia:  Check lipids.  5. CKD: S/p transplant.  Creatinine elevated stably.   Marca Ancona 10/22/2011

## 2011-10-22 NOTE — Patient Instructions (Addendum)
Increase coreg(carvedilol) to 12.5mg  two times a day. You can take two 6.25mg  tablets two times a day and use your current dupply.  Your physician recommends that you have a lipid profile /liver profile/BMET today.  You have been referred to [facility] for evaluation and management of sleep apnea/CPAP.  Your physician recommends that you schedule a follow-up appointment in: 1 month with Dr Shirlee Latch,.  Your physician has requested that you have an echocardiogram. Echocardiography is a painless test that uses sound waves to create images of your heart. It provides your doctor with information about the size and shape of your heart and how well your heart's chambers and valves are working. This procedure takes approximately one hour. There are no restrictions for this procedure. Mid-December 2013

## 2011-10-27 ENCOUNTER — Other Ambulatory Visit: Payer: Self-pay | Admitting: *Deleted

## 2011-10-27 DIAGNOSIS — I5022 Chronic systolic (congestive) heart failure: Secondary | ICD-10-CM

## 2011-10-27 MED ORDER — ATORVASTATIN CALCIUM 40 MG PO TABS: 40.0000 mg | ORAL_TABLET | Freq: Every day | ORAL | Status: DC

## 2011-10-31 ENCOUNTER — Encounter (HOSPITAL_BASED_OUTPATIENT_CLINIC_OR_DEPARTMENT_OTHER): Payer: BC Managed Care – PPO | Attending: General Surgery

## 2011-10-31 DIAGNOSIS — I10 Essential (primary) hypertension: Secondary | ICD-10-CM | POA: Insufficient documentation

## 2011-10-31 DIAGNOSIS — I872 Venous insufficiency (chronic) (peripheral): Secondary | ICD-10-CM | POA: Insufficient documentation

## 2011-10-31 DIAGNOSIS — L97809 Non-pressure chronic ulcer of other part of unspecified lower leg with unspecified severity: Secondary | ICD-10-CM | POA: Insufficient documentation

## 2011-11-13 ENCOUNTER — Encounter: Payer: Self-pay | Admitting: Pulmonary Disease

## 2011-11-13 ENCOUNTER — Ambulatory Visit (INDEPENDENT_AMBULATORY_CARE_PROVIDER_SITE_OTHER): Payer: BC Managed Care – PPO | Admitting: Pulmonary Disease

## 2011-11-13 VITALS — BP 180/110 | HR 75 | Ht 73.0 in | Wt 241.6 lb

## 2011-11-13 DIAGNOSIS — G4733 Obstructive sleep apnea (adult) (pediatric): Secondary | ICD-10-CM

## 2011-11-13 NOTE — Progress Notes (Signed)
  Subjective:    Patient ID: Chad [name], male    DOB: [birth year], 56 y.o.   MRN: [id number]  HPI The patient is a 56 year old male who I have been asked to see for possible obstructive sleep apnea.  The patient tells me that he was diagnosed approximately 5 years ago at [facility] with sleep apnea, and was started on CPAP.  He unfortunately lost his CPAP machine many years ago, and has not been on therapy.  He subsequently has lost a very large quantity of weight, and feels that he is sleeping much better.  He has been told that he snores, but no one has commented specifically on apneas during sleep.  He has frequent awakenings during the night that he blames on nocturia, but feels very rested in the mornings upon arising.  He feels that his alertness is excellent during the day, as well as in the evening.  He denies any sleepiness issues with driving.  He states that his weight is down more than 60 pounds over the last 2 years, and his Epworth score today is only 3.  Sleep Questionnaire: What time do you typically go to bed?( Between what hours) 10-11 pm How long does it take you to fall asleep? not to lng How many times during the night do you wake up? 4 What time do you get out of bed to start your day? 0730 Do you drive or operate heavy machinery in your occupation? No How much has your weight changed (up or down) over the past two years? (In pounds) Have you ever had a sleep study before? Yes If yes, location of study? [place] If yes, date of study? 2009? Do you currently use CPAP? No Do you wear oxygen at any time? No    Review of Systems  Constitutional: Negative for fever and unexpected weight change.  HENT: Negative for ear pain, nosebleeds, congestion, sore throat, rhinorrhea, sneezing, trouble swallowing, dental problem, postnasal drip and sinus pressure.   Eyes: Negative for redness and itching.  Respiratory: Negative for cough, chest tightness, shortness of breath and wheezing.     Cardiovascular: Positive for leg swelling. Negative for palpitations.  Gastrointestinal: Negative for nausea and vomiting.  Genitourinary: Negative for dysuria.  Musculoskeletal: Negative for joint swelling.  Skin: Negative for rash.  Neurological: Negative for headaches.  Hematological: Bruises/bleeds easily.  Psychiatric/Behavioral: Negative for dysphoric mood. The patient is not nervous/anxious.        Objective:   Physical Exam Constitutional:  Overweight male, no acute distress  HENT:  Nares patent without discharge  Oropharynx without exudate, palate and uvula are thick and elongated.  Eyes:  Perrla, eomi, no scleral icterus  Neck:  No JVD, no TMG  Cardiovascular:  Normal rate, regular rhythm, no rubs or gallops.  2/6 sem        Intact distal pulses  Pulmonary :  Normal breath sounds, no stridor or respiratory distress   No rales, rhonchi, or wheezing  Abdominal:  Soft, nondistended, bowel sounds present.  No tenderness noted.   Musculoskeletal:  3+ lower extremity edema noted.  Lymph Nodes:  No cervical lymphadenopathy noted  Skin:  No cyanosis noted  Neurologic:  Alert, appropriate, moves all 4 extremities without obvious deficit.         Assessment & Plan:

## 2011-11-13 NOTE — Patient Instructions (Addendum)
Will schedule for a followup sleep study since you have lost significant weight.  Will arrange followup once we get the results. Please call Dr. Caryn Section today, and let him know your blood pressure is elevated.

## 2011-11-13 NOTE — Assessment & Plan Note (Signed)
The patient has a history of sleep apnea from 4-5 years ago, and was treated with CPAP.  Those records are not available currently.  He unfortunately lost his machine many years ago, but has also lost 60-100 pounds by his history over the last few years.  He still has snoring, but he has no other symptoms that would suggest clinically significant sleep disordered breathing.  He has a history of a cardiomyopathy, hypertension, and has had a kidney transplant.  Therefore, I think it is very important to note if he still has sleep apnea or not.  I would like to schedule him for a sleep study, and the patient is agreeable.

## 2011-11-18 ENCOUNTER — Encounter: Payer: Self-pay | Admitting: Cardiology

## 2011-11-18 ENCOUNTER — Encounter: Payer: Self-pay | Admitting: *Deleted

## 2011-11-18 ENCOUNTER — Ambulatory Visit (INDEPENDENT_AMBULATORY_CARE_PROVIDER_SITE_OTHER): Payer: BC Managed Care – PPO | Admitting: Cardiology

## 2011-11-18 VITALS — BP 176/110 | HR 74 | Ht 73.0 in | Wt 241.0 lb

## 2011-11-18 DIAGNOSIS — G4733 Obstructive sleep apnea (adult) (pediatric): Secondary | ICD-10-CM

## 2011-11-18 DIAGNOSIS — I5022 Chronic systolic (congestive) heart failure: Secondary | ICD-10-CM

## 2011-11-18 DIAGNOSIS — I10 Essential (primary) hypertension: Secondary | ICD-10-CM

## 2011-11-18 DIAGNOSIS — N189 Chronic kidney disease, unspecified: Secondary | ICD-10-CM

## 2011-11-18 DIAGNOSIS — I4892 Unspecified atrial flutter: Secondary | ICD-10-CM

## 2011-11-18 DIAGNOSIS — I509 Heart failure, unspecified: Secondary | ICD-10-CM

## 2011-11-18 LAB — BASIC METABOLIC PANEL
CO2: 26 mEq/L (ref 19–32)
Chloride: 106 mEq/L (ref 96–112)
Creatinine, Ser: 3.1 mg/dL — ABNORMAL HIGH (ref 0.4–1.5)
Potassium: 3.7 mEq/L (ref 3.5–5.1)
Sodium: 139 mEq/L (ref 135–145)

## 2011-11-18 MED ORDER — ISOSORBIDE MONONITRATE ER 60 MG PO TB24: 60.0000 mg | ORAL_TABLET | Freq: Every day | ORAL | Status: DC

## 2011-11-18 MED ORDER — HYDRALAZINE HCL 50 MG PO TABS: 50.0000 mg | ORAL_TABLET | Freq: Three times a day (TID) | ORAL | Status: DC

## 2011-11-18 MED ORDER — CARVEDILOL 12.5 MG PO TABS: ORAL_TABLET | ORAL | Status: DC

## 2011-11-18 NOTE — Progress Notes (Signed)
Patient ID: Chad [name], male   DOB: 08-Oct-1955, 56 y.o.   MRN: [id number] Nephrology: Dr. Caryn Section  56 yo with history of renal transplant, HCV, paroxysmal atrial flutter, and chronic systolic CHF presents to establish outpatient cardiology followup.  He has had a complicated course recently.  In 5/13, he developed parainfluenza PNA that progressed to ARDS.  He had septic shock and was briefly on CVVH.  He went into atrial flutter with RVR (converted back to NSR spontaneously).  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  He went to inpatient rehab until 6/13. In 7/13, he was re-hospitalized with a lower leg wound infection.  Proteus and Serratia grew out.  He is now followed in the wound clinic.  He was admitted again in 9/13 with acute/chronic diastolic CHF.  Repeat echo showed EF 30-35%.  Lexiscan myoview showed EF 42% with no ischemia or infarction.   Patient currently is doing well.  He has chronic lower extremity edema, and both lower legs are wrapped.  No chest pain, orthopnea, or PND.  No claudication.  He has been doing housework and yardwork in his new house.  He is short of breath only with heavy exertion.  He walks around his back yard for exercise.  He can climb a flight of steps but is tired by the time he reaches the top.  He has a sleep study coming up in November.  Weight is down 1 lb since last appointment.  BP is still very high.  He is only taking hydralazine twice a day rather than three times a day.   Labs (9/13): K 4.2 => 4.6, creatinine 3.12 => 3.2, BNP 25000, TSH normal, LDL 153, HDL 40, LFTs normal  ECG: NSR,  prolonged QTc.   PMH: 1. Atrial flutter in setting of septic shock/PNA in 5/13.  2. CKD: Renal transplant 1996. Now baseline creatinine is around 3.  3. HTN 4. HCV 5. OSA: not currently on CPAP.  6. Parainfluenza PNA with ARDS and septic shock in 5/13.  He required CVVH briefly.   7. Lower leg wound infection 7/13 with Proteus and Serratia.  8.  Chronic systolic CHF: Echo (5/13) with EF 15-20%, diffuse hypokinesis.  Repeat echo in (9/13) with EF 30-35%, diffuse hypokinesis worse inferoposteriorly, mild to moderate MR with mild MS, mildly dilated RV with mildly decreased systolic function, PASP 73 mmHg.  Lexiscan myoview (9/13) with EF 42%, diffuse hypokinesis, no evidence for ischemia or infarction.  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   9. 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   FH: No premature CAD.    ROS: All systems reviewed and negative except as per HPI.   Current Outpatient Prescriptions  Medication Sig Dispense Refill  . amiodarone (PACERONE) 200 MG tablet Take 200 mg by mouth daily.      Marland Kitchen aspirin 81 MG chewable tablet Chew 1 tablet (81 mg total) by mouth daily.      . calcitRIOL (ROCALTROL) 0.25 MCG capsule Take 0.25 mcg by mouth daily.      . camphor-menthol (SARNA) lotion Apply topically as needed for itching.  222 mL    . famotidine (PEPCID) 20 MG tablet Take 1 tablet (20 mg total) by mouth daily.      . furosemide (LASIX) 40 MG tablet Take 40 mg by mouth 2 (two) times daily.      . hydrALAZINE (APRESOLINE) 50 MG  tablet Take 1 tablet (50 mg total) by mouth 3 (three) times daily between meals.  90 tablet  6  . HYDROcodone-acetaminophen (NORCO) 5-325 MG per tablet Take 1-2 tablets by mouth every 4 (four) hours as needed. For pain      . omeprazole (PRILOSEC) 20 MG capsule Take 20 mg by mouth daily.       Marland Kitchen oxyCODONE-acetaminophen (PERCOCET/ROXICET) 5-325 MG per tablet       . pantoprazole (PROTONIX) 40 MG tablet Take 40 mg by mouth 2 (two) times daily before a meal.      . potassium chloride (K-DUR,KLOR-CON) 10 MEQ tablet Take 10 mEq by mouth daily.      . predniSONE (DELTASONE) 10 MG tablet Take 10 mg by mouth daily.      . sirolimus (RAPAMUNE) 1 MG/ML solution Take 1.5 mg by mouth daily.      Marland Kitchen tiZANidine (ZANAFLEX) 2 MG tablet Take 2 mg by  mouth.       . tobramycin-dexamethasone (TOBRADEX) ophthalmic solution       . DISCONTD: carvedilol (COREG) 12.5 MG tablet Take 1 tablet (12.5 mg total) by mouth 2 (two) times daily.  180 tablet  3  . DISCONTD: hydrALAZINE (APRESOLINE) 50 MG tablet Take 1 tablet (50 mg total) by mouth 3 (three) times daily between meals.  90 tablet  3  . DISCONTD: isosorbide mononitrate (IMDUR) 30 MG 24 hr tablet Take 1 tablet (30 mg total) by mouth daily.  30 tablet  3  . atorvastatin (LIPITOR) 40 MG tablet Take 1 tablet (40 mg total) by mouth daily.      . carvedilol (COREG) 12.5 MG tablet 1 and 1/2 tablets (total 18.75mg ) two times a day  90 tablet  6  . isosorbide mononitrate (IMDUR) 60 MG 24 hr tablet Take 1 tablet (60 mg total) by mouth daily.  30 tablet  6    BP 176/110  Pulse 74  Ht 6\' 1"  (1.854 m)  Wt 241 lb (109.317 kg)  BMI 31.80 kg/m2 General: NAD, obese Neck: No JVD, no thyromegaly or thyroid nodule.  Lungs: Clear to auscultation bilaterally with normal respiraorty effort. CV: Nondisplaced PMI.  Heart regular S1/S2, no S3/S4, no murmur.  Legs wrapped with 1+ edema to knees.  No carotid bruit.    Abdomen: Soft, nontender, no hepatosplenomegaly, no distention.  Neurologic: Alert and oriented x 3.  Psych: Normal affect. Extremities: No clubbing or cyanosis.   Assessment/Plan: 1. Chronic systolic CHF:  EF 30-35% on last echo, 42% by myoview.  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  Myoview showed no ischemia or infarction.  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  JVP is not elevated today and weight is down a lb since last appointment.  - Increase Coreg to 18.75 mg bid.  - Continue Lasix at current dosing.  - Increase hydralazine to 50 mg tid and increase Imdur to 60 mg daily.    - Echo in 12/13 after intensive medical treatment.  If EF is still down, will need to think about ICD.  2. Atrial flutter: Patient has  maintained NSR on amiodarone.  He is not on coumadin due to history of GI bleeding.  Continue amiodarone. LFTs and TSH were normal recently.  He should have a yearly eye exam.  3. OSA: Has OSA but does not have a CPAP machine.  Sleep study in 11/13.   4. Hyperlipidemia:  LDL was high.  He has changed his statin to atorvastatin, will get lipids/LFTs in 12/13.  5. CKD: S/p transplant.  Creatinine elevated stably. Monitor closely.  6. HTN: BP still very elevated.  As above, increasing Coreg and hydralazine, as above.  I will have him followup with the PA in this office in 2 weeks and he will see me in a month for BP med titration.   Marca Ancona 11/18/2011

## 2011-11-18 NOTE — Patient Instructions (Addendum)
Increase coreg(carvedilol) to 18.75mg  two times a day. This will be one and one-half 12.5mg  tablets two times a day.  Take hydralazine 50mg  three times a day.  Increase Imdur(isosorbide) to 60mg  daily. You can take two 30mg  tablets daily at the same time and use your current supply.  Your physician recommends that you have  lab work today--BMET.  Your physician recommends that you schedule a follow-up appointment in: 2 weeks with the PA/NP.  Your physician recommends that you schedule a follow-up appointment in: 1 month with Dr Shirlee Latch.  Keep the fasting lab and echocardiogram appointments you already have scheduled for December 16,2013.

## 2011-11-19 NOTE — Addendum Note (Signed)
Addended by: Marrion Coy L on: 11/19/2011 03:12 PM   Modules accepted: Orders

## 2011-11-28 ENCOUNTER — Encounter (HOSPITAL_BASED_OUTPATIENT_CLINIC_OR_DEPARTMENT_OTHER): Payer: BC Managed Care – PPO | Attending: General Surgery

## 2011-11-28 DIAGNOSIS — L97909 Non-pressure chronic ulcer of unspecified part of unspecified lower leg with unspecified severity: Secondary | ICD-10-CM | POA: Insufficient documentation

## 2011-12-02 ENCOUNTER — Ambulatory Visit (HOSPITAL_BASED_OUTPATIENT_CLINIC_OR_DEPARTMENT_OTHER): Payer: BC Managed Care – PPO | Admitting: Radiology

## 2011-12-03 ENCOUNTER — Ambulatory Visit (INDEPENDENT_AMBULATORY_CARE_PROVIDER_SITE_OTHER): Payer: BC Managed Care – PPO | Admitting: Nurse Practitioner

## 2011-12-03 ENCOUNTER — Encounter: Payer: Self-pay | Admitting: Nurse Practitioner

## 2011-12-03 ENCOUNTER — Telehealth: Payer: Self-pay | Admitting: *Deleted

## 2011-12-03 ENCOUNTER — Other Ambulatory Visit: Payer: Self-pay | Admitting: *Deleted

## 2011-12-03 VITALS — BP 160/100 | HR 62 | Ht 73.0 in | Wt 252.8 lb

## 2011-12-03 DIAGNOSIS — I5022 Chronic systolic (congestive) heart failure: Secondary | ICD-10-CM

## 2011-12-03 DIAGNOSIS — R0600 Dyspnea, unspecified: Secondary | ICD-10-CM

## 2011-12-03 DIAGNOSIS — R0609 Other forms of dyspnea: Secondary | ICD-10-CM

## 2011-12-03 DIAGNOSIS — E876 Hypokalemia: Secondary | ICD-10-CM

## 2011-12-03 LAB — CBC WITH DIFFERENTIAL/PLATELET
Basophils Absolute: 0 10*3/uL (ref 0.0–0.1)
Basophils Relative: 0.4 % (ref 0.0–3.0)
Eosinophils Absolute: 0.3 10*3/uL (ref 0.0–0.7)
Eosinophils Relative: 2 % (ref 0.0–5.0)
HCT: 36.3 % — ABNORMAL LOW (ref 39.0–52.0)
Hemoglobin: 11.6 g/dL — ABNORMAL LOW (ref 13.0–17.0)
Lymphocytes Relative: 7.7 % — ABNORMAL LOW (ref 12.0–46.0)
Lymphs Abs: 1 10*3/uL (ref 0.7–4.0)
MCHC: 32 g/dL (ref 30.0–36.0)
MCV: 88.5 fl (ref 78.0–100.0)
Monocytes Absolute: 1.2 10*3/uL — ABNORMAL HIGH (ref 0.1–1.0)
Monocytes Relative: 9.2 % (ref 3.0–12.0)
Neutro Abs: 10.2 10*3/uL — ABNORMAL HIGH (ref 1.4–7.7)
Neutrophils Relative %: 80.7 % — ABNORMAL HIGH (ref 43.0–77.0)
Platelets: 151 10*3/uL (ref 150.0–400.0)
RBC: 4.1 Mil/uL — ABNORMAL LOW (ref 4.22–5.81)
RDW: 17.5 % — ABNORMAL HIGH (ref 11.5–14.6)
WBC: 12.6 10*3/uL — ABNORMAL HIGH (ref 4.5–10.5)

## 2011-12-03 LAB — BASIC METABOLIC PANEL
BUN: 53 mg/dL — ABNORMAL HIGH (ref 6–23)
CO2: 25 mEq/L (ref 19–32)
Calcium: 8.7 mg/dL (ref 8.4–10.5)
Chloride: 103 mEq/L (ref 96–112)
Creatinine, Ser: 3 mg/dL — ABNORMAL HIGH (ref 0.4–1.5)
GFR: 28.27 mL/min — ABNORMAL LOW (ref >60.00)
Glucose, Bld: 91 mg/dL (ref 70–99)
Potassium: 2.8 mEq/L — CL (ref 3.5–5.1)
Sodium: 137 mEq/L (ref 135–145)

## 2011-12-03 LAB — BRAIN NATRIURETIC PEPTIDE: Pro B Natriuretic peptide (BNP): 1019 pg/mL — ABNORMAL HIGH (ref 0.0–100.0)

## 2011-12-03 MED ORDER — CARVEDILOL 25 MG PO TABS: 25.0000 mg | ORAL_TABLET | Freq: Two times a day (BID) | ORAL | Status: DC

## 2011-12-03 MED ORDER — POTASSIUM CHLORIDE CRYS ER 20 MEQ PO TBCR: EXTENDED_RELEASE_TABLET | ORAL | Status: DC

## 2011-12-03 MED ORDER — HYDRALAZINE HCL 50 MG PO TABS: 75.0000 mg | ORAL_TABLET | Freq: Three times a day (TID) | ORAL | Status: DC

## 2011-12-03 NOTE — Patient Instructions (Addendum)
We need to check some labs today  Increase your Coreg (Carvedilol) to 25 mg two times a day. This is at the drug store.   Increase your Hydralazine to 75 mg three times a day. This is one pill and a half. This is at the drug store  Bring all your medicines with you to your next visit  Avoid salt  Call the [facility] office at [phone] if you have any questions, problems or concerns.

## 2011-12-03 NOTE — Telephone Encounter (Signed)
Pt aware of lab results he repeated back instructions on med change and will come in on  Monday, 12/08/11 for a bmet

## 2011-12-03 NOTE — Progress Notes (Signed)
Chad [name] Date of Birth: [birth year] Medical Record #[id number]  History of Present Illness: Chad [name] is seen back today for a 2 week check. He is seen for Dr. Shirlee Latch. He is a 56 year old male with multiple medical issues. He has had a prior renal transplant, HCV, paroxysmal atrial flutter and chronic systolic heart failure. He has recently established his cardiology care here. His other issues include parainfluenza PNA back in May that progressed to ARDS. He had septic shock and was briefly on CVVH. He went into atrial flutter with RVR and converted back to NSR spontaneously. EF was 15 to 20% on echo with concern for a septic CM. He was in inpatient rehab until June. In July he was backin the hospital with a lower wound leg infection which was positive for Proteus and Serratia. Now in the wound clinic. Admitted again in September with acute/chronic systolic heart failure with repeat echo showing EF 30 to 35%. His Lexiscan showed an EF of 42% with no evidence of ischemia or infarction.   He was here two weeks ago. Several medicines were titrated up for his heart failure and for his BP. The plan is that he will need a repeat echo in December and proceed with ICD implant if EF is still down. He is not on anticoagulation due to prior GI bleeding. He is on chronic amiodarone. Has a sleep study pending. His creatinine is elevated and he remains anemic based on last set of labs.    He comes in today. He is here alone. Says he is slowly getting stronger. Breathing continues to improve. Less DOE. Less swelling. Continues in the wound clinic. No chest pain. He is unsure of most of his medicines. Did not bring a list or his pills with him. On 3 different type of GI medicines. Not sure what he is taking. Blood pressure was down to 140/90 at the wound clinic. He is more limited by his back. Wanting more pain medicine and muscle relaxers. Does not have an actual PCP and says Dr. Caryn Section does all of his care. Not  dizzy or lightheaded. Does not have scales at home. Does not use salt. He thinks his heart is doing better. It is his back that is his most limiting factor. His weight is significantly higher today and he will need to be reweighed.   Current Outpatient Prescriptions on File Prior to Visit  Medication Sig Dispense Refill  . amiodarone (PACERONE) 200 MG tablet Take 200 mg by mouth daily.      Marland Kitchen aspirin 81 MG chewable tablet Chew 1 tablet (81 mg total) by mouth daily.      Marland Kitchen atorvastatin (LIPITOR) 40 MG tablet Take 1 tablet (40 mg total) by mouth daily.      . calcitRIOL (ROCALTROL) 0.25 MCG capsule Take 0.25 mcg by mouth daily.      . camphor-menthol (SARNA) lotion Apply topically as needed for itching.  222 mL    . famotidine (PEPCID) 20 MG tablet Take 1 tablet (20 mg total) by mouth daily.      . furosemide (LASIX) 40 MG tablet Take 40 mg by mouth 2 (two) times daily.      . isosorbide mononitrate (IMDUR) 60 MG 24 hr tablet Take 1 tablet (60 mg total) by mouth daily.  30 tablet  6  . omeprazole (PRILOSEC) 20 MG capsule Take 20 mg by mouth daily.       . pantoprazole (PROTONIX) 40 MG tablet Take 40  mg by mouth 2 (two) times daily before a meal.      . potassium chloride (K-DUR,KLOR-CON) 10 MEQ tablet Take 10 mEq by mouth daily.      . predniSONE (DELTASONE) 10 MG tablet Take 10 mg by mouth daily.      . sirolimus (RAPAMUNE) 1 MG/ML solution Take 1.5 mg by mouth daily.      Marland Kitchen tiZANidine (ZANAFLEX) 2 MG tablet Take 2 mg by mouth.       . [DISCONTINUED] hydrALAZINE (APRESOLINE) 50 MG tablet Take 1 tablet (50 mg total) by mouth 3 (three) times daily between meals.  90 tablet  6    No Known Allergies  Past Medical History  Diagnosis Date  . Hypertension   . H/O kidney transplant 1996  . Hepatitis C 1996  . Chronic kidney disease   . Pneumonia   . HLD (hyperlipidemia)   . Cardiomyopathy     unknown etiology, EF 30-35% by echo 09/2011  . Systolic CHF   . Atrial flutter     in the setting of  sepsis 05/2011; on amio; no anticoagulation    Past Surgical History  Procedure Date  . Nephrectomy transplanted organ   . Insertion of dialysis catheter 06/20/2011    Procedure: INSERTION OF DIALYSIS CATHETER;  Surgeon: [name], MD;  Location: [facility] OR;  Service: Vascular;  Laterality: Right;  Ultrasound guided insertion of right internal jugular dialysis catheter  . Multiple extractions with alveoloplasty 06/27/2011    Procedure: MULTIPLE EXTRACION WITH ALVEOLOPLASTY;  Surgeon: [name], DDS;  Location: [facility] OR;  Service: Oral Surgery;  Laterality: N/A;  Extraction  of tooth # 14 with alveoloplasty  . Hip surgery   . Joint replacement     left hip    History  Smoking status  . Never Smoker   Smokeless tobacco  . Never Used    History  Alcohol Use No    Family History  Problem Relation Age of Onset  . Heart disease Neg Hx     Review of Systems: The review of systems is per the HPI.  All other systems were reviewed and are negative.  Physical Exam: BP 160/100  Pulse 62  Ht 6\' 1"  (1.854 m)  Wt 252 lb 12.8 oz (114.669 kg)  BMI 33.35 kg/m2 His initial weight is up 11 pounds.  Reweigh is down to 249. This is still up 8 pounds since last visit. Patient is very pleasant and in no acute distress. He is obese. Skin is warm and dry. Color is normal.  HEENT is unremarkable. Normocephalic/atraumatic. PERRL. Sclera are nonicteric. Neck is supple. No masses. No JVD. Lungs are coarse. Cardiac exam shows a regular rate and rhythm. He has a systolic murmur. Abdomen is obese but soft. Extremities are full with trace edema. Both legs are wrapped. Gait and ROM are intact. No gross neurologic deficits noted.  LABORATORY DATA:  Lab Results  Component Value Date   WBC 10.4 10/05/2011   HGB 9.5* 10/05/2011   HCT 29.4* 10/05/2011   PLT 192 10/05/2011   GLUCOSE 94 11/18/2011   CHOL 229* 10/22/2011   TRIG 149.0 10/22/2011   HDL 47.70 10/22/2011   LDLDIRECT 152.6 10/22/2011   ALT 32  10/22/2011   AST 37 10/22/2011   NA 139 11/18/2011   K 3.7 11/18/2011   CL 106 11/18/2011   CREATININE 3.1* 11/18/2011   BUN 60* 11/18/2011   CO2 26 11/18/2011   TSH 1.572 10/03/2011   INR  1.16 06/15/2011    Assessment / Plan: 1. Systolic heart failure - we will reweigh him. I have increased his Coreg and his Hydralazine today. He does not look like he has volume overload today and he says he is actually feeling better.   2. Atrial flutter - in sinus on amiodarone - not a candidate for coumadin.   3. OSA - was not able to complete sleep study last night due to back pain. Plans to reschedule.  4. HTN - I have increased his Coreg and Hydralazine.  5. Back pain - this actually seems to be his most limiting factor.   6. Bilateral leg wounds - followed in the wound clinic and reportedly improving.   We will recheck his labs today. Medicines are titrated up. I have asked him to talk with Dr. Caryn Section regarding his pain medicines. He has follow up with Dr. Shirlee Latch and for his echo. He needs to bring in all of his medicines for review and to double check his list.   Patient is agreeable to this plan and will call if any problems develop in the interim.

## 2011-12-08 ENCOUNTER — Encounter (HOSPITAL_COMMUNITY): Payer: Self-pay | Admitting: Emergency Medicine

## 2011-12-08 ENCOUNTER — Other Ambulatory Visit: Payer: Self-pay

## 2011-12-08 ENCOUNTER — Inpatient Hospital Stay (HOSPITAL_COMMUNITY)
Admission: EM | Admit: 2011-12-08 | Discharge: 2011-12-11 | DRG: 291 | Disposition: A | Discharge status: Home / Self Care | Payer: BC Managed Care – PPO | Attending: Internal Medicine | Admitting: Internal Medicine

## 2011-12-08 ENCOUNTER — Emergency Department (HOSPITAL_COMMUNITY): Payer: BC Managed Care – PPO

## 2011-12-08 DIAGNOSIS — G7281 Critical illness myopathy: Secondary | ICD-10-CM

## 2011-12-08 DIAGNOSIS — J189 Pneumonia, unspecified organism: Secondary | ICD-10-CM | POA: Diagnosis present

## 2011-12-08 DIAGNOSIS — B1920 Unspecified viral hepatitis C without hepatic coma: Secondary | ICD-10-CM | POA: Diagnosis present

## 2011-12-08 DIAGNOSIS — I429 Cardiomyopathy, unspecified: Secondary | ICD-10-CM

## 2011-12-08 DIAGNOSIS — Z940 Kidney transplant status: Secondary | ICD-10-CM

## 2011-12-08 DIAGNOSIS — I428 Other cardiomyopathies: Secondary | ICD-10-CM | POA: Diagnosis present

## 2011-12-08 DIAGNOSIS — H1032 Unspecified acute conjunctivitis, left eye: Secondary | ICD-10-CM

## 2011-12-08 DIAGNOSIS — N179 Acute kidney failure, unspecified: Secondary | ICD-10-CM

## 2011-12-08 DIAGNOSIS — L97929 Non-pressure chronic ulcer of unspecified part of left lower leg with unspecified severity: Secondary | ICD-10-CM

## 2011-12-08 DIAGNOSIS — T8610 Unspecified complication of kidney transplant: Secondary | ICD-10-CM

## 2011-12-08 DIAGNOSIS — I5021 Acute systolic (congestive) heart failure: Secondary | ICD-10-CM

## 2011-12-08 DIAGNOSIS — J80 Acute respiratory distress syndrome: Secondary | ICD-10-CM

## 2011-12-08 DIAGNOSIS — J9691 Respiratory failure, unspecified with hypoxia: Secondary | ICD-10-CM | POA: Diagnosis present

## 2011-12-08 DIAGNOSIS — J9600 Acute respiratory failure, unspecified whether with hypoxia or hypercapnia: Secondary | ICD-10-CM | POA: Diagnosis present

## 2011-12-08 DIAGNOSIS — G4733 Obstructive sleep apnea (adult) (pediatric): Secondary | ICD-10-CM | POA: Diagnosis present

## 2011-12-08 DIAGNOSIS — N189 Chronic kidney disease, unspecified: Secondary | ICD-10-CM | POA: Diagnosis present

## 2011-12-08 DIAGNOSIS — Z79899 Other long term (current) drug therapy: Secondary | ICD-10-CM

## 2011-12-08 DIAGNOSIS — L03116 Cellulitis of left lower limb: Secondary | ICD-10-CM

## 2011-12-08 DIAGNOSIS — I129 Hypertensive chronic kidney disease with stage 1 through stage 4 chronic kidney disease, or unspecified chronic kidney disease: Secondary | ICD-10-CM | POA: Diagnosis present

## 2011-12-08 DIAGNOSIS — I5022 Chronic systolic (congestive) heart failure: Secondary | ICD-10-CM

## 2011-12-08 DIAGNOSIS — R6521 Severe sepsis with septic shock: Secondary | ICD-10-CM

## 2011-12-08 DIAGNOSIS — I4891 Unspecified atrial fibrillation: Secondary | ICD-10-CM | POA: Diagnosis present

## 2011-12-08 DIAGNOSIS — Z7982 Long term (current) use of aspirin: Secondary | ICD-10-CM

## 2011-12-08 DIAGNOSIS — I509 Heart failure, unspecified: Secondary | ICD-10-CM | POA: Diagnosis present

## 2011-12-08 DIAGNOSIS — I4892 Unspecified atrial flutter: Secondary | ICD-10-CM

## 2011-12-08 DIAGNOSIS — I5023 Acute on chronic systolic (congestive) heart failure: Principal | ICD-10-CM

## 2011-12-08 DIAGNOSIS — I480 Paroxysmal atrial fibrillation: Secondary | ICD-10-CM

## 2011-12-08 DIAGNOSIS — I10 Essential (primary) hypertension: Secondary | ICD-10-CM | POA: Diagnosis present

## 2011-12-08 DIAGNOSIS — D696 Thrombocytopenia, unspecified: Secondary | ICD-10-CM

## 2011-12-08 DIAGNOSIS — E785 Hyperlipidemia, unspecified: Secondary | ICD-10-CM | POA: Diagnosis present

## 2011-12-08 DIAGNOSIS — D72829 Elevated white blood cell count, unspecified: Secondary | ICD-10-CM | POA: Diagnosis present

## 2011-12-08 HISTORY — DX: Other cardiomyopathies: I42.8

## 2011-12-08 HISTORY — DX: Chronic kidney disease, stage 4 (severe): N18.4

## 2011-12-08 LAB — COMPREHENSIVE METABOLIC PANEL
ALT: 17 U/L (ref 0–53)
AST: 28 U/L (ref 0–37)
Alkaline Phosphatase: 131 U/L — ABNORMAL HIGH (ref 39–117)
CO2: 21 mEq/L (ref 19–32)
GFR calc Af Amer: 28 mL/min — ABNORMAL LOW (ref >90)
GFR calc non Af Amer: 24 mL/min — ABNORMAL LOW (ref >90)
Glucose, Bld: 119 mg/dL — ABNORMAL HIGH (ref 70–99)
Potassium: 3.6 mEq/L (ref 3.5–5.1)
Sodium: 138 mEq/L (ref 135–145)

## 2011-12-08 LAB — CBC WITH DIFFERENTIAL/PLATELET
Basophils Absolute: 0 10*3/uL (ref 0.0–0.1)
Lymphocytes Relative: 6 % — ABNORMAL LOW (ref 12–46)
Lymphs Abs: 1.4 10*3/uL (ref 0.7–4.0)
Neutro Abs: 19.9 10*3/uL — ABNORMAL HIGH (ref 1.7–7.7)
Neutrophils Relative %: 83 % — ABNORMAL HIGH (ref 43–77)
Platelets: 210 10*3/uL (ref 150–400)
RBC: 4.5 MIL/uL (ref 4.22–5.81)
RDW: 16.9 % — ABNORMAL HIGH (ref 11.5–15.5)
WBC: 24 10*3/uL — ABNORMAL HIGH (ref 4.0–10.5)

## 2011-12-08 LAB — URINALYSIS, ROUTINE W REFLEX MICROSCOPIC
Bilirubin Urine: NEGATIVE
Glucose, UA: NEGATIVE mg/dL
Hgb urine dipstick: NEGATIVE
Protein, ur: 100 mg/dL — AB
Specific Gravity, Urine: 1.013 (ref 1.005–1.030)
Urobilinogen, UA: 0.2 mg/dL (ref 0.0–1.0)

## 2011-12-08 LAB — URINE MICROSCOPIC-ADD ON

## 2011-12-08 LAB — TROPONIN I: Troponin I: <0.3 ng/mL (ref <0.30)

## 2011-12-08 LAB — MRSA PCR SCREENING: MRSA by PCR: NEGATIVE

## 2011-12-08 MED ORDER — SODIUM CHLORIDE 0.9 % IJ SOLN: 3.0000 mL | INTRAMUSCULAR | Status: DC | PRN

## 2011-12-08 MED ORDER — ONDANSETRON HCL 4 MG PO TABS: 4.0000 mg | ORAL_TABLET | Freq: Four times a day (QID) | ORAL | Status: DC | PRN

## 2011-12-08 MED ORDER — LABETALOL HCL 5 MG/ML IV SOLN
20.0000 mg | INTRAVENOUS | Status: DC | PRN
  Filled 2011-12-08: qty 4

## 2011-12-08 MED ORDER — PIPERACILLIN-TAZOBACTAM 3.375 G IVPB
3.3750 g | Freq: Three times a day (TID) | INTRAVENOUS | Status: DC
  Administered 2011-12-08 – 2011-12-11 (×8): 3.375 g via INTRAVENOUS
  Filled 2011-12-08 (×11): qty 50

## 2011-12-08 MED ORDER — ACETAMINOPHEN 325 MG PO TABS: 650.0000 mg | ORAL_TABLET | Freq: Four times a day (QID) | ORAL | Status: DC | PRN

## 2011-12-08 MED ORDER — ATORVASTATIN CALCIUM 40 MG PO TABS
40.0000 mg | ORAL_TABLET | Freq: Every day | ORAL | Status: DC
  Administered 2011-12-09 – 2011-12-11 (×3): 40 mg via ORAL
  Filled 2011-12-08 (×3): qty 1

## 2011-12-08 MED ORDER — VANCOMYCIN HCL 1000 MG IV SOLR
1500.0000 mg | INTRAVENOUS | Status: DC
  Administered 2011-12-08 – 2011-12-10 (×2): 1500 mg via INTRAVENOUS
  Filled 2011-12-08 (×2): qty 1500

## 2011-12-08 MED ORDER — PIPERACILLIN-TAZOBACTAM 3.375 G IVPB 30 MIN
3.3750 g | Freq: Once | INTRAVENOUS | Status: AC
  Administered 2011-12-08: 3.375 g via INTRAVENOUS
  Filled 2011-12-08: qty 50

## 2011-12-08 MED ORDER — FAMOTIDINE 20 MG PO TABS
20.0000 mg | ORAL_TABLET | Freq: Every day | ORAL | Status: DC
  Administered 2011-12-08 – 2011-12-11 (×4): 20 mg via ORAL
  Filled 2011-12-08 (×5): qty 1

## 2011-12-08 MED ORDER — ISOSORBIDE MONONITRATE ER 60 MG PO TB24
60.0000 mg | ORAL_TABLET | Freq: Every day | ORAL | Status: DC
  Filled 2011-12-08: qty 1

## 2011-12-08 MED ORDER — ASPIRIN 81 MG PO CHEW
81.0000 mg | CHEWABLE_TABLET | Freq: Every day | ORAL | Status: DC
  Administered 2011-12-08 – 2011-12-11 (×4): 81 mg via ORAL
  Filled 2011-12-08 (×4): qty 1

## 2011-12-08 MED ORDER — PIPERACILLIN-TAZOBACTAM 3.375 G IVPB 30 MIN: 3.3750 g | Freq: Once | INTRAVENOUS | Status: DC

## 2011-12-08 MED ORDER — SIROLIMUS 1 MG/ML PO SOLN
1.5000 mg | Freq: Every day | ORAL | Status: DC
  Administered 2011-12-09: 1.5 mg via ORAL

## 2011-12-08 MED ORDER — SODIUM CHLORIDE 0.9 % IV SOLN: 250.0000 mL | INTRAVENOUS | Status: DC | PRN

## 2011-12-08 MED ORDER — HYDROCORTISONE SOD SUCCINATE 100 MG IJ SOLR
50.0000 mg | Freq: Two times a day (BID) | INTRAMUSCULAR | Status: DC
  Administered 2011-12-08 – 2011-12-11 (×6): 50 mg via INTRAVENOUS
  Filled 2011-12-08 (×8): qty 1

## 2011-12-08 MED ORDER — SIROLIMUS 1 MG/ML PO SOLN
1.5000 mg | Freq: Every day | ORAL | Status: DC
  Filled 2011-12-08 (×9): qty 1.5

## 2011-12-08 MED ORDER — FUROSEMIDE 10 MG/ML IJ SOLN
INTRAMUSCULAR | Status: AC
  Filled 2011-12-08: qty 8

## 2011-12-08 MED ORDER — HYDRALAZINE HCL 50 MG PO TABS
75.0000 mg | ORAL_TABLET | Freq: Three times a day (TID) | ORAL | Status: DC
  Administered 2011-12-08: 75 mg via ORAL
  Filled 2011-12-08 (×4): qty 1

## 2011-12-08 MED ORDER — ACETAMINOPHEN 650 MG RE SUPP: 650.0000 mg | Freq: Four times a day (QID) | RECTAL | Status: DC | PRN

## 2011-12-08 MED ORDER — CARVEDILOL 25 MG PO TABS
25.0000 mg | ORAL_TABLET | Freq: Two times a day (BID) | ORAL | Status: DC
  Administered 2011-12-08 – 2011-12-11 (×6): 25 mg via ORAL
  Filled 2011-12-08 (×9): qty 1

## 2011-12-08 MED ORDER — ONDANSETRON HCL 4 MG/2ML IJ SOLN: 4.0000 mg | Freq: Four times a day (QID) | INTRAMUSCULAR | Status: DC | PRN

## 2011-12-08 MED ORDER — POTASSIUM CHLORIDE CRYS ER 20 MEQ PO TBCR
20.0000 meq | EXTENDED_RELEASE_TABLET | Freq: Every day | ORAL | Status: DC
  Administered 2011-12-08 – 2011-12-11 (×4): 20 meq via ORAL
  Filled 2011-12-08 (×4): qty 1

## 2011-12-08 MED ORDER — SODIUM CHLORIDE 0.9 % IJ SOLN
3.0000 mL | Freq: Two times a day (BID) | INTRAMUSCULAR | Status: DC
  Administered 2011-12-09 – 2011-12-11 (×5): 3 mL via INTRAVENOUS

## 2011-12-08 MED ORDER — PANTOPRAZOLE SODIUM 40 MG PO TBEC
40.0000 mg | DELAYED_RELEASE_TABLET | Freq: Two times a day (BID) | ORAL | Status: DC
  Administered 2011-12-09 – 2011-12-11 (×5): 40 mg via ORAL
  Filled 2011-12-08 (×5): qty 1

## 2011-12-08 MED ORDER — AMIODARONE HCL 200 MG PO TABS
200.0000 mg | ORAL_TABLET | Freq: Every day | ORAL | Status: DC
  Administered 2011-12-09 – 2011-12-11 (×3): 200 mg via ORAL
  Filled 2011-12-08 (×3): qty 1

## 2011-12-08 MED ORDER — VANCOMYCIN HCL IN DEXTROSE 1-5 GM/200ML-% IV SOLN
1000.0000 mg | Freq: Once | INTRAVENOUS | Status: AC
  Administered 2011-12-08: 1000 mg via INTRAVENOUS
  Filled 2011-12-08: qty 200

## 2011-12-08 MED ORDER — FUROSEMIDE 10 MG/ML IJ SOLN
60.0000 mg | Freq: Two times a day (BID) | INTRAMUSCULAR | Status: DC
  Administered 2011-12-08: 60 mg via INTRAVENOUS
  Filled 2011-12-08 (×3): qty 6

## 2011-12-08 MED ORDER — CALCITRIOL 0.25 MCG PO CAPS
0.2500 ug | ORAL_CAPSULE | Freq: Every day | ORAL | Status: DC
  Administered 2011-12-08 – 2011-12-11 (×4): 0.25 ug via ORAL
  Filled 2011-12-08 (×4): qty 1

## 2011-12-08 MED ORDER — SODIUM CHLORIDE 0.9 % IV BOLUS (SEPSIS): 1000.0000 mL | Freq: Once | INTRAVENOUS | Status: DC

## 2011-12-08 MED ORDER — LEVOFLOXACIN IN D5W 500 MG/100ML IV SOLN
500.0000 mg | INTRAVENOUS | Status: DC
  Administered 2011-12-08: 500 mg via INTRAVENOUS
  Filled 2011-12-08 (×2): qty 100

## 2011-12-08 MED ORDER — OXYCODONE HCL 5 MG PO TABS: 5.0000 mg | ORAL_TABLET | ORAL | Status: DC | PRN

## 2011-12-08 NOTE — Progress Notes (Signed)
ANTIBIOTIC CONSULT NOTE - INITIAL  Pharmacy Consult for Vancomycin and Zosyn Indication: Pneumonia  No Known Allergies  Patient Measurements: Weight:  114.7kg Height:  185cm  Vital Signs: Temp: 98.3 F (36.8 C) (11/11 1245) BP: 156/95 mmHg (11/11 1700) Pulse Rate: 87  (11/11 1700)  Labs:  Basename 12/08/11 1312  WBC 24.0*  HGB 13.0  PLT 210  LABCREA --  CREATININE 2.79*   Microbiology: He has been admitted in the past with Proteus and Serratia.  He has also had parainfluenza PNA resulting in ARDS.  Medical History: Past Medical History  Diagnosis Date  . Hypertension   . H/O kidney transplant 1996  . Hepatitis C 1996  . Chronic kidney disease   . Pneumonia   . HLD (hyperlipidemia)   . Cardiomyopathy     unknown etiology, EF 30-35% by echo 09/2011  . Systolic CHF   . Atrial flutter     in the setting of sepsis 05/2011; on amio; no anticoagulation    Medications:  Amiodarone Aspirin Atorvastatin  Isosorbide Mononitrate Omeprazole Pantoprazole  Potassium Prednisone Sirolimus  Tizanidine Hydralazine Furosemide  Calcitriol Famotidine    Assessment: 56yo male admitted with worsening SOB.  He was recently here in September for acute/chronic CHF.  He has had a renal transplant and has a creatinine of 2.79 with an estimated clearance of ~ 44ml/min.  He is obese with chronic medical problems including a leg wound for which he is treated at the wound clinic.  His WBC on admit is 24K and he is to be started on IV antibiotics empirically for presumed pneumonia.    Goal of Therapy:  Vancomycin trough level 15-20 Therapeutic response to IV antibiotics  Plan:   Vancomycin 1500mg  IV every 48 hours  Zosyn 3.375gm IV every 8 hours.  Monitor renal function and adjust as needed.  Monitor cultures and minimize IV ABX if possible  Nadara Mustard, PharmD., MS Clinical Pharmacist Pager:  [phone] Thank you for allowing pharmacy to be part of this patients care  team. 12/08/2011,5:23 PM

## 2011-12-08 NOTE — Consult Note (Signed)
Reason for Consult: Dyspnea Referring Physician:  Dr. Brien Few PCP: Dr. Marina Gravel Primary cardiologist: Dr. Maryanna Shape is an 56 y.o. male.  HPI: This pleasant 56 year old male has a very complex past medical history.  He has a past history of end-stage renal disease and had a renal transplant in 1996.  He has a past history of chronic systolic congestive heart failure secondary to a nonischemic cardiomyopathy. He has a history of paroxysmal atrial flutter and a history of dyslipidemia.  He has a history of obstructive sleep apnea and a past history of GI bleed.  In may 2013 he was hospitalized with pneumonia which progressed to [facility] and septic shock.  His ejection fraction at that time was only 15-20% but subsequent to that has improved on serial measurements.  In September he was admitted for acute on chronic congestive heart failure and his echocardiogram showed an ejection fraction of 30-35% and is likely scan Myoview showed an ejection fraction of 42% with no ischemia or infarct.  The patient had been doing well and was recently seen in our office by Norma Fredrickson ANP on 12/03/11.  His blood pressure was elevated at 160/100 and his medications were titrated up.  His carvedilol was increased to 25 mg twice a day and his hydralazine was increased to 75 mg 3 times a day.  He was felt not to be fluid overloaded on that visit.  He felt well until early this morning when he awoke with shortness of breath.  He felt a mild chill but no shaking chills.  He had a mild cough productive of clear sputum.  He has not had any hemoptysis.  In the emergency room his oxygen saturation on room air was 82-85%.  Chest x-ray showed evidence of suspected early interstitial pulmonary edema and a pneumonia could not be excluded.  The patient denies any chest pain or angina and his initial troponin is normal.  EKG in the emergency room showed sinus tachycardia which is new since 11/18/11 but there are no acute  ischemic changes. Past Medical History  Diagnosis Date  . Hypertension   . Hepatitis C 1996  . CKD (chronic kidney disease), stage IV     a. s/p renal transplant 1996.  Marland Kitchen Pneumonia   . HLD (hyperlipidemia)   . Nonischemic cardiomyopathy     a. unknown etiology, EF 30-35% by echo 09/2011;  b. 09/2011 Lexi MV EF 42%, no ischemia/infarct.  . Systolic CHF   . Atrial flutter     a. in the setting of sepsis 05/2011; on amio; no anticoagulation    Past Surgical History  Procedure Date  . Nephrectomy transplanted organ   . Insertion of dialysis catheter 06/20/2011    Procedure: INSERTION OF DIALYSIS CATHETER;  Surgeon: [name], MD;  Location: [facility] OR;  Service: Vascular;  Laterality: Right;  Ultrasound guided insertion of right internal jugular dialysis catheter  . Multiple extractions with alveoloplasty 06/27/2011    Procedure: MULTIPLE EXTRACION WITH ALVEOLOPLASTY;  Surgeon: [name], DDS;  Location: [facility] OR;  Service: Oral Surgery;  Laterality: N/A;  Extraction  of tooth # 14 with alveoloplasty  . Hip surgery   . Joint replacement     left hip    Family History  Problem Relation Age of Onset  . Heart disease Neg Hx     Social History:  reports that he has never smoked. He has never used smokeless tobacco. He reports that he does not drink alcohol  or use illicit drugs.  Allergies: No Known Allergies  Medications:  Scheduled:   . amiodarone  200 mg Oral Daily  . aspirin  81 mg Oral Daily  . atorvastatin  40 mg Oral Daily  . calcitRIOL  0.25 mcg Oral Daily  . carvedilol  25 mg Oral BID WC  . famotidine  20 mg Oral Daily  . furosemide  60 mg Intravenous BID  . hydrALAZINE  75 mg Oral TID BM  . hydrocortisone sod succinate (SOLU-CORTEF) injection  50 mg Intravenous Q12H  . isosorbide mononitrate  60 mg Oral Daily  . levofloxacin (LEVAQUIN) IV  500 mg Intravenous Q24H  . pantoprazole  40 mg Oral BID AC  . piperacillin-tazobactam  3.375 g Intravenous Once  .  piperacillin-tazobactam (ZOSYN)  IV  3.375 g Intravenous Q8H  . potassium chloride  20 mEq Oral Daily  . sirolimus  1.5 mg Oral Daily  . sodium chloride  3 mL Intravenous Q12H  . vancomycin  1,500 mg Intravenous Q48H  . [COMPLETED] vancomycin  1,000 mg Intravenous Once  . [DISCONTINUED] piperacillin-tazobactam  3.375 g Intravenous Once  . [DISCONTINUED] sirolimus  1.5 mg Oral Daily  . [DISCONTINUED] sodium chloride  1,000 mL Intravenous Once    Results for orders placed during the hospital encounter of 12/08/11 (from the past 48 hour(s))  CBC WITH DIFFERENTIAL     Status: Abnormal   Collection Time   12/08/11  1:12 PM      Component Value Range Comment   WBC 24.0 (*) 4.0 - 10.5 K/uL    RBC 4.50  4.22 - 5.81 MIL/uL    Hemoglobin 13.0  13.0 - 17.0 g/dL    HCT 16.1 (*) 09.6 - 52.0 %    MCV 85.1  78.0 - 100.0 fL    MCH 28.9  26.0 - 34.0 pg    MCHC 33.9  30.0 - 36.0 g/dL    RDW 04.5 (*) 40.9 - 15.5 %    Platelets 210  150 - 400 K/uL    Neutrophils Relative 83 (*) 43 - 77 %    Neutro Abs 19.9 (*) 1.7 - 7.7 K/uL    Lymphocytes Relative 6 (*) 12 - 46 %    Lymphs Abs 1.4  0.7 - 4.0 K/uL    Monocytes Relative 10  3 - 12 %    Monocytes Absolute 2.3 (*) 0.1 - 1.0 K/uL    Eosinophils Relative 2  0 - 5 %    Eosinophils Absolute 0.4  0.0 - 0.7 K/uL    Basophils Relative 0  0 - 1 %    Basophils Absolute 0.0  0.0 - 0.1 K/uL   COMPREHENSIVE METABOLIC PANEL     Status: Abnormal   Collection Time   12/08/11  1:12 PM      Component Value Range Comment   Sodium 138  135 - 145 mEq/L    Potassium 3.6  3.5 - 5.1 mEq/L    Chloride 101  96 - 112 mEq/L    CO2 21  19 - 32 mEq/L    Glucose, Bld 119 (*) 70 - 99 mg/dL    BUN 49 (*) 6 - 23 mg/dL    Creatinine, Ser 8.11 (*) 0.50 - 1.35 mg/dL    Calcium 9.3  8.4 - 91.4 mg/dL    Total Protein 8.1  6.0 - 8.3 g/dL    Albumin 3.4 (*) 3.5 - 5.2 g/dL    AST 28  0 - 37 U/L  ALT 17  0 - 53 U/L    Alkaline Phosphatase 131 (*) 39 - 117 U/L    Total  Bilirubin 0.5  0.3 - 1.2 mg/dL    GFR calc non Af Amer 24 (*) >90 mL/min    GFR calc Af Amer 28 (*) >90 mL/min   LACTIC ACID, PLASMA     Status: Normal   Collection Time   12/08/11  1:12 PM      Component Value Range Comment   Lactic Acid, Venous 1.7  0.5 - 2.2 mmol/L   URINALYSIS, ROUTINE W REFLEX MICROSCOPIC     Status: Abnormal   Collection Time   12/08/11  3:26 PM      Component Value Range Comment   Color, Urine YELLOW  YELLOW    APPearance CLEAR  CLEAR    Specific Gravity, Urine 1.013  1.005 - 1.030    pH 5.0  5.0 - 8.0    Glucose, UA NEGATIVE  NEGATIVE mg/dL    Hgb urine dipstick NEGATIVE  NEGATIVE    Bilirubin Urine NEGATIVE  NEGATIVE    Ketones, ur NEGATIVE  NEGATIVE mg/dL    Protein, ur 147 (*) NEGATIVE mg/dL    Urobilinogen, UA 0.2  0.0 - 1.0 mg/dL    Nitrite NEGATIVE  NEGATIVE    Leukocytes, UA NEGATIVE  NEGATIVE   URINE MICROSCOPIC-ADD ON     Status: Normal   Collection Time   12/08/11  3:26 PM      Component Value Range Comment   WBC, UA 0-2  <3 WBC/hpf    Bacteria, UA RARE  RARE   TROPONIN I     Status: Normal   Collection Time   12/08/11  5:30 PM      Component Value Range Comment   Troponin I <0.30  <0.30 ng/mL     Dg Chest Portable 1 View  12/08/2011  *RADIOLOGY REPORT*  Clinical Data: Shortness of breath.  Weakness.  PORTABLE CHEST - 1 VIEW  Comparison: Chest x-ray 10/03/2011.  Findings: Lung volumes are low.  Retrocardiac opacity on the left may reflect atelectasis and/or consolidation (however, this is likely accentuated by severe underpenetration of the film). Possible small left pleural effusion. There is cephalization of the pulmonary vasculature and slight indistinctness of the interstitial markings suggestive of mild pulmonary edema.  Mild cardiomegaly is unchanged. The patient is rotated to the left on today's exam, resulting in distortion of the mediastinal contours and reduced diagnostic sensitivity and specificity for mediastinal pathology.  Atherosclerosis in the thoracic aorta.  IMPRESSION: 1.  Mild cardiomegaly with mild interstitial pulmonary edema; findings suggestive of congestive heart failure. 2.  Under penetrated film with poor visualization of the left lower lobe.  Underlying atelectasis and/or consolidation is not excluded in the left lower lobe, however, these findings may alternatively be technique related.  Additionally, the possibility of a small left pleural effusion is not excluded.   Original Report Authenticated By: Trudie Reed, M.D.     Review of systems reveals that the patient has been voiding normal amounts of urine.  He has had some recent GI symptoms with vomiting and loose bowel movements but no frank diarrhea. Blood pressure 164/91, pulse 90, temperature 98.8 F (37.1 C), temperature source Oral, resp. rate 28, height 6\' 1"  (1.854 m), weight 249 lb 9 oz (113.2 kg), SpO2 97.00%. The patient appears to be in no distress at the present time.  Oxygen level is 97% on nasal oxygen.  Head and neck exam reveals  that the pupils are equal and reactive.  The extraocular movements are full.  There is no scleral icterus.  Mouth and pharynx are benign.  No lymphadenopathy.  No carotid bruits.  The jugular venous pressure is elevated  Thyroid is not enlarged or tender.  The chest reveals a few inspiratory rales at the bases.  There is no expiratory wheezing and there is no dullness.  The heart reveals sinus tachycardia and a soft S3 gallop.  There is a grade 2/6 holosystolic murmur at the lower left sternal edge.  The abdomen is soft and nontender.  Bowel sounds are normoactive.  There is no hepatosplenomegaly or mass.  There are previous scars related to his renal transplant which is located on his right flank  Extremities reveal both legs have Ace wraps up to the knee.  Cannot exam for pedal pulses.  Patient is being treated for leg ulcers at the wound center.  Neurologic exam reveals that the patient is oriented  alert and cooperative.  Moves all extremities normally  Integument reveals no rash  Assessment/Plan: 1. Dyspnea of sudden onset of uncertain etiology.  The onset was abrupt and was associated with the chill and he has a white blood cell count of 24,000.  His chest x-ray was reviewed by me and is certainly consistent with early interstitial pulmonary edema but could also be consistent with opportunistic pulmonary infection in the face of chronic immunosuppression.  There is no evidence of an acute myocardial ischemic injury to account for his sudden change of symptoms. 2. acute on chronic systolic congestive heart failure 3. chronic renal disease with functioning transplanted kidney and stable creatinine levels. 4. hypertension with initial blood pressure in the emergency room registering 203/105.  His blood pressure is now down to 164/91.  He has marked systolic hypertension could be a contributing factor in his sudden apparent decompensation and hypoxemia. 5. chronic immunosuppression 6. past history of paroxysmal atrial flutter, currently maintaining normal sinus rhythm on amiodarone.  Recommendation: Agree with initial assessment and plan as noted by Dr. Brien Few.  We will get an echocardiogram to see if there has been any deterioration of function since the last study of September 2013.  Continue amiodarone to prevent recurrence of paroxysmal atrial flutter.  Continue carvedilol and hydralazine and isosorbide mononitrate.  He is now receiving intravenous Lasix.  If serial chest x-rays fail to show clearing of interstitial fluid with Lasix diuresis I would consider having pulmonary see the patient also. We'll follow with you.  Cassell Clement 12/08/2011, 7:11 PM

## 2011-12-08 NOTE — ED Notes (Signed)
Pt c/o increased SOB starting last night; pt tachypnic at present and O2 sat 85% on RA; pt sts hx of PNA and feels same; pt sts some cough

## 2011-12-08 NOTE — ED Notes (Signed)
Pt from triage.  States that he got SOB last night and came in today for eval.  Pt speaking answering one answers.  Obvious respiratory distress.  Pt placed on 15L via NRB.  Pt improving.

## 2011-12-08 NOTE — ED Provider Notes (Signed)
History     CSN: [id number]  Arrival date & time 12/08/11  1243   First MD Initiated Contact with Patient 12/08/11 1250      Chief Complaint  Patient presents with  . Shortness of Breath    (Consider location/radiation/quality/duration/timing/severity/associated sxs/prior treatment) HPI Comments: 56 year old male presents emergency department complaining of increasing shortness of breath last night. Admits to feeling lightheaded fever this morning along with nausea and vomiting. States this feels similar to when he had pneumonia back in may and had to be admitted to the hospital while intubated. Admits to associated slight cough and left sided back pain. Denies chest pain.  Patient is a 56 y.o. male presenting with shortness of breath. The history is provided by the patient.  Shortness of Breath  Associated symptoms include a fever, cough and shortness of breath. Pertinent negatives include no chest pain.    Past Medical History  Diagnosis Date  . Hypertension   . H/O kidney transplant 1996  . Hepatitis C 1996  . Chronic kidney disease   . Pneumonia   . HLD (hyperlipidemia)   . Cardiomyopathy     unknown etiology, EF 30-35% by echo 09/2011  . Systolic CHF   . Atrial flutter     in the setting of sepsis 05/2011; on amio; no anticoagulation    Past Surgical History  Procedure Date  . Nephrectomy transplanted organ   . Insertion of dialysis catheter 06/20/2011    Procedure: INSERTION OF DIALYSIS CATHETER;  Surgeon: [name], MD;  Location: [facility] OR;  Service: Vascular;  Laterality: Right;  Ultrasound guided insertion of right internal jugular dialysis catheter  . Multiple extractions with alveoloplasty 06/27/2011    Procedure: MULTIPLE EXTRACION WITH ALVEOLOPLASTY;  Surgeon: [name], DDS;  Location: [facility] OR;  Service: Oral Surgery;  Laterality: N/A;  Extraction  of tooth # 14 with alveoloplasty  . Hip surgery   . Joint replacement     left hip    Family History    Problem Relation Age of Onset  . Heart disease Neg Hx     History  Substance Use Topics  . Smoking status: Never Smoker   . Smokeless tobacco: Never Used  . Alcohol Use: No      Review of Systems  Constitutional: Positive for fever.  Respiratory: Positive for cough and shortness of breath.   Cardiovascular: Negative for chest pain.  Gastrointestinal: Positive for nausea and vomiting.  Musculoskeletal: Positive for back pain.  All other systems reviewed and are negative.    Allergies  Review of patient's allergies indicates no known allergies.  Home Medications   Current Outpatient Rx  Name  Route  Sig  Dispense  Refill  . AMIODARONE HCL 200 MG PO TABS   Oral   Take 200 mg by mouth daily.         . ASPIRIN 81 MG PO CHEW   Oral   Chew 1 tablet (81 mg total) by mouth daily.         . ATORVASTATIN CALCIUM 40 MG PO TABS   Oral   Take 1 tablet (40 mg total) by mouth daily.         Marland Kitchen CALCITRIOL 0.25 MCG PO CAPS   Oral   Take 0.25 mcg by mouth daily.         Marland Kitchen CAMPHOR-MENTHOL 0.5-0.5 % EX LOTN   Topical   Apply topically as needed for itching.   222 mL      .  CARVEDILOL 25 MG PO TABS   Oral   Take 1 tablet (25 mg total) by mouth 2 (two) times daily.   180 tablet   3   . FAMOTIDINE 20 MG PO TABS   Oral   Take 1 tablet (20 mg total) by mouth daily.         . FUROSEMIDE 40 MG PO TABS   Oral   Take 40 mg by mouth 2 (two) times daily.         Marland Kitchen HYDRALAZINE HCL 50 MG PO TABS   Oral   Take 1.5 tablets (75 mg total) by mouth 3 (three) times daily between meals.   180 tablet   6   . ISOSORBIDE MONONITRATE ER 60 MG PO TB24   Oral   Take 1 tablet (60 mg total) by mouth daily.   30 tablet   6   . OMEPRAZOLE 20 MG PO CPDR   Oral   Take 20 mg by mouth daily.          Marland Kitchen PANTOPRAZOLE SODIUM 40 MG PO TBEC   Oral   Take 40 mg by mouth 2 (two) times daily before a meal.         . POTASSIUM CHLORIDE CRYS ER 20 MEQ PO TBCR      Take one  tablet (20 meq) daily   30 tablet   11   . PREDNISONE 10 MG PO TABS   Oral   Take 10 mg by mouth daily.         Marland Kitchen SIROLIMUS 1 MG/ML PO SOLN   Oral   Take 1.5 mg by mouth daily.         Marland Kitchen TIZANIDINE HCL 2 MG PO TABS   Oral   Take 2 mg by mouth.            BP 203/105  Pulse 106  Temp 98.3 F (36.8 C)  Resp 36  SpO2 82%  Physical Exam  Nursing note and vitals reviewed. Constitutional: He is oriented to person, place, and time. He appears well-developed and well-nourished. He appears ill. Face mask in place.  HENT:  Head: Normocephalic and atraumatic.  Eyes: Conjunctivae normal and EOM are normal.  Neck: Normal range of motion. Neck supple. No JVD present.  Cardiovascular: Regular rhythm and intact distal pulses.  Tachycardia present.   Pulmonary/Chest: Accessory muscle usage present. Tachypnea noted. He has rhonchi in the left upper field and the left middle field.  Abdominal: Soft. Normal appearance and bowel sounds are normal. There is no tenderness.  Musculoskeletal: Normal range of motion.  Neurological: He is alert and oriented to person, place, and time.  Skin: Skin is warm.       Very warm  Psychiatric: His behavior is normal. His mood appears anxious.    ED Course  Procedures (including critical care time)  Labs Reviewed  CBC WITH DIFFERENTIAL - Abnormal; Notable for the following:    WBC 24.0 (*)     HCT 38.3 (*)     RDW 16.9 (*)     Neutrophils Relative 83 (*)     Neutro Abs 19.9 (*)     Lymphocytes Relative 6 (*)     Monocytes Absolute 2.3 (*)     All other components within normal limits  COMPREHENSIVE METABOLIC PANEL - Abnormal; Notable for the following:    Glucose, Bld 119 (*)     BUN 49 (*)     Creatinine, Ser 2.79 (*)  Albumin 3.4 (*)     Alkaline Phosphatase 131 (*)     GFR calc non Af Amer 24 (*)     GFR calc Af Amer 28 (*)     All other components within normal limits  URINALYSIS, ROUTINE W REFLEX MICROSCOPIC - Abnormal;  Notable for the following:    Protein, ur 100 (*)     All other components within normal limits  CBC - Abnormal; Notable for the following:    WBC 16.2 (*)     RBC 3.81 (*)     Hemoglobin 10.6 (*)     HCT 32.5 (*)     RDW 16.7 (*)     Platelets 134 (*)     All other components within normal limits  COMPREHENSIVE METABOLIC PANEL - Abnormal; Notable for the following:    BUN 49 (*)     Creatinine, Ser 2.77 (*)     Albumin 2.7 (*)     GFR calc non Af Amer 24 (*)     GFR calc Af Amer 28 (*)     All other components within normal limits  PRO B NATRIURETIC PEPTIDE - Abnormal; Notable for the following:    Pro B Natriuretic peptide (BNP) 35266.0 (*)     All other components within normal limits  LACTIC ACID, PLASMA  CULTURE, BLOOD (ROUTINE X 2)  CULTURE, BLOOD (ROUTINE X 2)  URINE MICROSCOPIC-ADD ON  TROPONIN I  TROPONIN I  TROPONIN I  MRSA PCR SCREENING  PROCALCITONIN   Date: 12/08/2011  Rate: 114  Rhythm: sinus tachycardia  QRS Axis: normal  Intervals: normal  ST/T Wave abnormalities: nonspecific ST changes  Conduction Disutrbances:nonspecific intraventricular conduction delay  Narrative Interpretation:   Old EKG Reviewed: changes noted nonspecific intraventricular conduction delay   Dg Chest Portable 1 View  12/08/2011  *RADIOLOGY REPORT*  Clinical Data: Shortness of breath.  Weakness.  PORTABLE CHEST - 1 VIEW  Comparison: Chest x-ray 10/03/2011.  Findings: Lung volumes are low.  Retrocardiac opacity on the left may reflect atelectasis and/or consolidation (however, this is likely accentuated by severe underpenetration of the film). Possible small left pleural effusion. There is cephalization of the pulmonary vasculature and slight indistinctness of the interstitial markings suggestive of mild pulmonary edema.  Mild cardiomegaly is unchanged. The patient is rotated to the left on today's exam, resulting in distortion of the mediastinal contours and reduced diagnostic  sensitivity and specificity for mediastinal pathology. Atherosclerosis in the thoracic aorta.  IMPRESSION: 1.  Mild cardiomegaly with mild interstitial pulmonary edema; findings suggestive of congestive heart failure. 2.  Under penetrated film with poor visualization of the left lower lobe.  Underlying atelectasis and/or consolidation is not excluded in the left lower lobe, however, these findings may alternatively be technique related.  Additionally, the possibility of a small left pleural effusion is not excluded.   Original Report Authenticated By: Trudie Reed, M.D.      1. Chronic systolic heart failure   2. Acute on chronic systolic heart failure   3. Acute respiratory failure   4. Community acquired pneumonia   5. History of renal transplant       MDM  56 year old male with pneumonia. IV vancomycin and Zosyn started. He is admitted to the hospitalist.        Trevor Mace, PA-C 12/09/11 [phone]

## 2011-12-08 NOTE — H&P (Signed)
PCP:   [name], MD   Chief Complaint:  Shortness of breath x 1 day.  HPI: This is a 56 year old male, with rather complex medical history, including renal transplant 1996, HCV, paroxysmal atrial flutter, dyslipidemia and chronic systolic CHF/NICM, OSA and history of GI bleed. In 05/2011 he developed parainfluenza pneumonia that progressed to ARDS, septic shock and was briefly on CVVH. 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 Following recovery, he went to inpatient rehab until 06/2011. In 07/2011, he was re-hospitalized with a lower leg wound infection secondary to Proteus and Serratia. He is now followed in the wound clinic. His most recent hospitalization was 10/02/11-10/15/11 for acute on chronic diastolic CHF. Repeat echo showed EF 30-35%. Lexiscan myoview showed EF 42% with no ischemia or infarction. According to patient, he was quite well all day on 12/07/11, went to bed at his usual tome, and awoke suddenly with shortness of breath at about 3:00 AM on 12/08/11. He denies chest pain, or fever, has a mild cough, productive of clear phlegm. He has vomited twice since, has loose bowel movements, but no diarrhea. In the ED, patient was found to be tachypneic, with an O2 sat of 82%-85% on RA. He was placed on 15L via NRB.      Allergies:  No Known Allergies    Past Medical History  Diagnosis Date  . Hypertension   . H/O kidney transplant 1996  . Hepatitis C 1996  . Chronic kidney disease   . Pneumonia   . HLD (hyperlipidemia)   . Cardiomyopathy     unknown etiology, EF 30-35% by echo 09/2011  . Systolic CHF   . Atrial flutter     in the setting of sepsis 05/2011; on amio; no anticoagulation    Past Surgical History  Procedure Date  . Nephrectomy transplanted organ   . Insertion of dialysis catheter 06/20/2011    Procedure: INSERTION OF DIALYSIS CATHETER;  Surgeon: [name], MD;  Location: [facility] OR;  Service: Vascular;  Laterality:  Right;  Ultrasound guided insertion of right internal jugular dialysis catheter  . Multiple extractions with alveoloplasty 06/27/2011    Procedure: MULTIPLE EXTRACION WITH ALVEOLOPLASTY;  Surgeon: [name], DDS;  Location: [facility] OR;  Service: Oral Surgery;  Laterality: N/A;  Extraction  of tooth # 14 with alveoloplasty  . Hip surgery   . Joint replacement     left hip    Prior to Admission medications   Medication Sig Start Date End Date Taking? Authorizing Provider  amiodarone (PACERONE) 200 MG tablet Take 200 mg by mouth daily. 07/16/11 07/15/12  Mcarthur Rossetti Angiulli, PA  aspirin 81 MG chewable tablet Chew 1 tablet (81 mg total) by mouth daily. 10/05/11 10/04/12  Catarina Hartshorn, MD  atorvastatin (LIPITOR) 40 MG tablet Take 1 tablet (40 mg total) by mouth daily. 11/18/11   Laurey Morale, MD  calcitRIOL (ROCALTROL) 0.25 MCG capsule Take 0.25 mcg by mouth daily. 07/16/11 07/15/12  Mcarthur Rossetti Angiulli, PA  camphor-menthol [facility]) lotion Apply topically as needed for itching. 10/05/11 10/04/12  Catarina Hartshorn, MD  carvedilol (COREG) 25 MG tablet Take 1 tablet (25 mg total) by mouth 2 (two) times daily. 12/03/11   Rosalio Macadamia, NP  famotidine (PEPCID) 20 MG tablet Take 1 tablet (20 mg total) by mouth daily. 10/05/11 10/04/12  Catarina Hartshorn, MD  furosemide (LASIX) 40 MG tablet Take 40 mg by mouth 2 (two) times daily. 07/16/11 07/15/12  Mcarthur Rossetti Angiulli, PA  hydrALAZINE (APRESOLINE) 50 MG tablet Take 1.5 tablets (75 mg total) by mouth 3 (three) times daily between meals. 12/03/11 12/02/12  Rosalio Macadamia, NP  isosorbide mononitrate (IMDUR) 60 MG 24 hr tablet Take 1 tablet (60 mg total) by mouth daily. 11/18/11   Laurey Morale, MD  omeprazole (PRILOSEC) 20 MG capsule Take 20 mg by mouth daily.  10/28/11   Historical Provider, MD  pantoprazole (PROTONIX) 40 MG tablet Take 40 mg by mouth 2 (two) times daily before a meal. 07/16/11 07/15/12  Mcarthur Rossetti Angiulli, PA  potassium chloride (K-DUR,KLOR-CON) 20 MEQ tablet Take one tablet (20  meq) daily 12/03/11   Rosalio Macadamia, NP  predniSONE (DELTASONE) 10 MG tablet Take 10 mg by mouth daily.    Historical Provider, MD  sirolimus (RAPAMUNE) 1 MG/ML solution Take 1.5 mg by mouth daily. 07/16/11 07/15/12  Mcarthur Rossetti Angiulli, PA  tiZANidine (ZANAFLEX) 2 MG tablet Take 2 mg by mouth.  10/05/11   Historical Provider, MD    Social History: Works at [employer], has one son. He reports that he has never smoked. He has never used smokeless tobacco. He reports that he does not drink alcohol or use illicit drugs.  Family History  Problem Relation Age of Onset  . Heart disease Neg Hx     Review of Systems:  As per HPI and chief complaint. Patent denies fatigue, diminished appetite, weight loss, fever, chills, headache, blurred vision, difficulty in speaking, dysphagia, chest pain, diaphoresis, abdominal pain, belching, heartburn, hematemesis, melena, dysuria, nocturia, urinary frequency, hematochezia. He does have marked lower extremity swelling, but no pain or redness. The rest of the systems review is negative.  Physical Exam:  General:  Patient is mildly short of breath at rest and on minimal exertion. Alert, communicative, fully oriented, talking in complete sentences.  HEENT:  No clinical pallor, no jaundice, no conjunctival injection or discharge. NECK:  Supple, JVP not seen, no carotid bruits, no palpable lymphadenopathy, no palpable goiter. CHEST:  Bibasal crackles, Lt>Rt, no wheeze. HEART:  Sounds 1 and 2 heard, normal, regular, no murmurs. ABDOMEN:  Morbidly obese, has striae atrophicae, soft, non-tender, no palpable organomegaly, normal bowel sounds. Renal transplant scar in RLQ is unremarkable.  GENITALIA:  Normal external genitalia. LOWER EXTREMITIES:  Marked pitting edema. Both LE are under Unna wraps. Palpable peripheral pulses. MUSCULOSKELETAL SYSTEM:  Unremarkable. CENTRAL NERVOUS SYSTEM:  No focal neurologic deficit on gross examination.  Labs on Admission:  Results for  orders placed during the hospital encounter of 12/08/11 (from the past 48 hour(s))  CBC WITH DIFFERENTIAL     Status: Abnormal   Collection Time   12/08/11  1:12 PM      Component Value Range Comment   WBC 24.0 (*) 4.0 - 10.5 K/uL    RBC 4.50  4.22 - 5.81 MIL/uL    Hemoglobin 13.0  13.0 - 17.0 g/dL    HCT 11.9 (*) 14.7 - 52.0 %    MCV 85.1  78.0 - 100.0 fL    MCH 28.9  26.0 - 34.0 pg    MCHC 33.9  30.0 - 36.0 g/dL    RDW 82.9 (*) 56.2 - 15.5 %    Platelets 210  150 - 400 K/uL    Neutrophils Relative 83 (*) 43 - 77 %    Neutro Abs 19.9 (*) 1.7 - 7.7 K/uL    Lymphocytes Relative 6 (*) 12 - 46 %    Lymphs Abs 1.4  0.7 - 4.0 K/uL    Monocytes Relative 10  3 - 12 %    Monocytes Absolute 2.3 (*) 0.1 - 1.0 K/uL    Eosinophils Relative 2  0 - 5 %    Eosinophils Absolute 0.4  0.0 - 0.7 K/uL    Basophils Relative 0  0 - 1 %    Basophils Absolute 0.0  0.0 - 0.1 K/uL   COMPREHENSIVE METABOLIC PANEL     Status: Abnormal   Collection Time   12/08/11  1:12 PM      Component Value Range Comment   Sodium 138  135 - 145 mEq/L    Potassium 3.6  3.5 - 5.1 mEq/L    Chloride 101  96 - 112 mEq/L    CO2 21  19 - 32 mEq/L    Glucose, Bld 119 (*) 70 - 99 mg/dL    BUN 49 (*) 6 - 23 mg/dL    Creatinine, Ser 4.78 (*) 0.50 - 1.35 mg/dL    Calcium 9.3  8.4 - 29.5 mg/dL    Total Protein 8.1  6.0 - 8.3 g/dL    Albumin 3.4 (*) 3.5 - 5.2 g/dL    AST 28  0 - 37 U/L    ALT 17  0 - 53 U/L    Alkaline Phosphatase 131 (*) 39 - 117 U/L    Total Bilirubin 0.5  0.3 - 1.2 mg/dL    GFR calc non Af Amer 24 (*) >90 mL/min    GFR calc Af Amer 28 (*) >90 mL/min   LACTIC ACID, PLASMA     Status: Normal   Collection Time   12/08/11  1:12 PM      Component Value Range Comment   Lactic Acid, Venous 1.7  0.5 - 2.2 mmol/L   URINALYSIS, ROUTINE W REFLEX MICROSCOPIC     Status: Abnormal   Collection Time   12/08/11  3:26 PM      Component Value Range Comment   Color, Urine YELLOW  YELLOW    APPearance CLEAR  CLEAR     Specific Gravity, Urine 1.013  1.005 - 1.030    pH 5.0  5.0 - 8.0    Glucose, UA NEGATIVE  NEGATIVE mg/dL    Hgb urine dipstick NEGATIVE  NEGATIVE    Bilirubin Urine NEGATIVE  NEGATIVE    Ketones, ur NEGATIVE  NEGATIVE mg/dL    Protein, ur 621 (*) NEGATIVE mg/dL    Urobilinogen, UA 0.2  0.0 - 1.0 mg/dL    Nitrite NEGATIVE  NEGATIVE    Leukocytes, UA NEGATIVE  NEGATIVE   URINE MICROSCOPIC-ADD ON     Status: Normal   Collection Time   12/08/11  3:26 PM      Component Value Range Comment   WBC, UA 0-2  <3 WBC/hpf    Bacteria, UA RARE  RARE     Radiological Exams on Admission: *RADIOLOGY REPORT*  Clinical Data: Shortness of breath. Weakness.  PORTABLE CHEST - 1 VIEW  Comparison: Chest x-ray 10/03/2011.  Findings: Lung volumes are low. Retrocardiac opacity on the left  may reflect atelectasis and/or consolidation (however, this is  likely accentuated by severe underpenetration of the film).  Possible small left pleural effusion. There is cephalization of the  pulmonary vasculature and slight indistinctness of the interstitial  markings suggestive of mild pulmonary edema. Mild cardiomegaly is  unchanged. The patient is rotated to the left on today's exam,  resulting in distortion of the mediastinal contours and reduced  diagnostic sensitivity and specificity for mediastinal pathology.  Atherosclerosis in the thoracic aorta.  IMPRESSION:  1. Mild cardiomegaly with mild interstitial pulmonary edema;  findings suggestive of congestive heart failure.  2. Under penetrated film with poor visualization of the left lower  lobe. Underlying atelectasis and/or consolidation is not excluded  in the left lower lobe, however, these findings may alternatively  be technique related. Additionally, the possibility of a small  left pleural effusion is not excluded.  Original Report Authenticated By: Trudie Reed, M.D.   Assessment/Plan Active Problems:    1. CHF (congestive heart failure):  Patient has a known history of non-ischemic cardiomyopathy and chronic systolic CHF, latest EF 30-35% per 2D echocardiogram 09/2011. He presents with acute onset dyspnea, without chest pain, and with a mild cough, productive of clear phlegm. CXR confirms CHF and cardiomegaly, consistent with acute decompensation of his systolic CHF. Will admit, manage with iv diuretics, and cycle cardiac enzymes. 12-lead EKG shows a sinus tachycardia, but no acute ischemic changes. Patient will be admitted to SDU for close observation, given his precarious respiratory status. We shall consult Dr Marca Ancona et al.  2. Community acquired pneumonia: CXR in addition, revealed poor visualization of the left lower lobe. Underlying atelectasis and/or consolidation is not excluded. Patient does have a leucocytosis of 24.0, and is on immuno-suppresssive therapy. I concur with antibiotic therapy for now. He has been started on Vancomycin/Zosyn in the ED . We shall continue this, and add Levaquin. Blood cultures have been sent off.  3. Acute respiratory failure: Inn the ED, patient was found to be tachypneic, with an O2 sat of 82%-85% on RA. He was placed on 15L via NRB. The etiology is #s 1&2 above. Patient will be admitted to SDU for close observation, given his precarious respiratory status. He may need prn Bipap.  4. PAF (paroxysmal atrial fibrillation): Patient has known PAF, but is not a candidate for anticoagulation, due to previous history of GI bleed he is currently in SR.  4. History of renal transplant: On immunosuppressive, which we shall continue. He shall be placed on stress doses of steroids, to cover acute illness. Dr Caryn Section, his primary nephrologist, will be notified of hospitalization. Creatinine appears at baseline, currently.  5. HTN (hypertension): This is severe, uncontrolled at 203/105. This may be contributory to acute CHF decompensation. Will address as indicated.   6. OSA: Patient has a questionable history of  OSA. He was evaluated recently by Dr Diamantina Providence, and is scheduled for a sleep study this November.   Further management will depend on clinical course.  Comment: Patient is FULL CODE.   Time Spent on Admission: 1 hour.  Shanyn Preisler,CHRISTOPHER 12/08/2011, 4:34 PM

## 2011-12-09 ENCOUNTER — Inpatient Hospital Stay (HOSPITAL_COMMUNITY): Payer: BC Managed Care – PPO

## 2011-12-09 DIAGNOSIS — I319 Disease of pericardium, unspecified: Secondary | ICD-10-CM

## 2011-12-09 DIAGNOSIS — I5022 Chronic systolic (congestive) heart failure: Secondary | ICD-10-CM

## 2011-12-09 DIAGNOSIS — J9600 Acute respiratory failure, unspecified whether with hypoxia or hypercapnia: Secondary | ICD-10-CM

## 2011-12-09 DIAGNOSIS — I10 Essential (primary) hypertension: Secondary | ICD-10-CM

## 2011-12-09 LAB — COMPREHENSIVE METABOLIC PANEL
AST: 20 U/L (ref 0–37)
Albumin: 2.7 g/dL — ABNORMAL LOW (ref 3.5–5.2)
BUN: 49 mg/dL — ABNORMAL HIGH (ref 6–23)
Calcium: 8.7 mg/dL (ref 8.4–10.5)
Chloride: 103 mEq/L (ref 96–112)
Creatinine, Ser: 2.77 mg/dL — ABNORMAL HIGH (ref 0.50–1.35)
Total Bilirubin: 0.5 mg/dL (ref 0.3–1.2)
Total Protein: 6.7 g/dL (ref 6.0–8.3)

## 2011-12-09 LAB — PRO B NATRIURETIC PEPTIDE: Pro B Natriuretic peptide (BNP): 35266 pg/mL — ABNORMAL HIGH (ref 0–125)

## 2011-12-09 LAB — PROCALCITONIN: Procalcitonin: 4.48 ng/mL

## 2011-12-09 MED ORDER — SIROLIMUS 1 MG/ML PO SOLN
1.5000 mg | Freq: Every day | ORAL | Status: DC
  Administered 2011-12-09 – 2011-12-11 (×3): 1.5 mg via ORAL
  Filled 2011-12-09 (×6): qty 1.5

## 2011-12-09 MED ORDER — LEVOFLOXACIN IN D5W 750 MG/150ML IV SOLN
750.0000 mg | INTRAVENOUS | Status: DC
  Administered 2011-12-10: 750 mg via INTRAVENOUS
  Filled 2011-12-09: qty 150

## 2011-12-09 MED ORDER — HYDRALAZINE HCL 50 MG PO TABS
100.0000 mg | ORAL_TABLET | Freq: Three times a day (TID) | ORAL | Status: DC
  Administered 2011-12-09 – 2011-12-11 (×7): 100 mg via ORAL
  Filled 2011-12-09 (×10): qty 2

## 2011-12-09 MED ORDER — NITROGLYCERIN IN D5W 200-5 MCG/ML-% IV SOLN
2.0000 ug/min | INTRAVENOUS | Status: DC
  Administered 2011-12-09: 100 ug/min via INTRAVENOUS
  Administered 2011-12-09: 75 ug/min via INTRAVENOUS
  Administered 2011-12-10 (×2): 150 ug/min via INTRAVENOUS
  Administered 2011-12-10: 125 ug/min via INTRAVENOUS
  Administered 2011-12-11: 150 ug/min via INTRAVENOUS
  Filled 2011-12-09 (×7): qty 250

## 2011-12-09 MED ORDER — ENOXAPARIN SODIUM 30 MG/0.3ML ~~LOC~~ SOLN
30.0000 mg | SUBCUTANEOUS | Status: DC
  Administered 2011-12-09 – 2011-12-10 (×2): 30 mg via SUBCUTANEOUS
  Filled 2011-12-09 (×4): qty 0.3

## 2011-12-09 MED ORDER — OXYMETAZOLINE HCL 0.05 % NA SOLN
1.0000 | Freq: Two times a day (BID) | NASAL | Status: DC
  Administered 2011-12-09: 1 via NASAL
  Filled 2011-12-09: qty 15

## 2011-12-09 MED ORDER — ENOXAPARIN SODIUM 40 MG/0.4ML ~~LOC~~ SOLN: 40.0000 mg | SUBCUTANEOUS | Status: DC

## 2011-12-09 MED ORDER — SALINE SPRAY 0.65 % NA SOLN
1.0000 | NASAL | Status: DC | PRN
  Filled 2011-12-09: qty 44

## 2011-12-09 MED ORDER — FUROSEMIDE 10 MG/ML IJ SOLN
60.0000 mg | Freq: Three times a day (TID) | INTRAMUSCULAR | Status: DC
  Administered 2011-12-09 – 2011-12-11 (×7): 60 mg via INTRAVENOUS
  Filled 2011-12-09 (×6): qty 6

## 2011-12-09 NOTE — Progress Notes (Signed)
  Echocardiogram 2D Echocardiogram has been performed.  Chad [name] 12/09/2011, 1:01 PM

## 2011-12-09 NOTE — Progress Notes (Signed)
[name] PROGRESS NOTE  Chad [name] EXB:[id number] DOB: December 11, 1955 DOA: 12/08/2011 PCP: [name], MD  Assessment/Plan: Active Problems:  Respiratory failure with hypoxia Due to CHF exacerbation and Pneumonia   CHF (congestive heart failure) Appreciate cardiology management. Lasix and Nitro infusion per cardiology.    Community acquired pneumonia-LLL on CXR Cont current antibiotics, repeat CXR - PA/Lat today. - cont Zosyn and VAnc- f/u sputum culture  Sinus congestion/ sinusitis? Check xray sinuses, start nasal saline and afrin    PAF (paroxysmal atrial fibrillation) Currently NSR- on Amiodarone   History of renal transplant On immunosuppressives- Prednisone 10 mg daily at home- on stress dose Hydrocortisone now   HTN (hypertension) On Coreg, Hydralazine PO   Code Status: full code  Disposition Plan: follow in SDU DVT prophylaxis: Lovenox   Brief narrative: This is a 56 year old male, with rather complex medical history, including renal transplant 1996, HCV, paroxysmal atrial flutter, dyslipidemia and chronic systolic CHF/NICM, OSA and history of GI bleed. In 05/2011 he developed parainfluenza pneumonia that progressed to ARDS, septic shock and was briefly on CVVH. 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 Following recovery, he went to inpatient rehab until 06/2011. In 07/2011, he was re-hospitalized with a lower leg wound infection secondary to Proteus and Serratia. He is now followed in the wound clinic. His most recent hospitalization was 10/02/11-10/15/11 for acute on chronic diastolic CHF. Repeat echo showed EF 30-35%. Lexiscan myoview showed EF 42% with no ischemia or infarction. According to patient, he was quite well all day on 12/07/11, went to bed at his usual tome, and awoke suddenly with shortness of breath at about 3:00 AM on 12/08/11. He denies chest pain, or fever, has a mild cough, productive of clear  phlegm. He has vomited twice since, has loose bowel movements, but no diarrhea. In the ED, patient was found to be tachypneic, with an O2 sat of 82%-85% on RA. He was placed on 15L via NRB.    Consultants:  [facility]  Procedures:  none  Antibiotics:  Vanc, Zosyn and Levaquin- 11/11>>  HPI/Subjective: Pt coughing up mild amounts of sputum- blood tinged. Feels sinuses are heavy- pressure between his eyes. No chest pain. No nausea/vomiting/diarreha or constipation.   Objective: Filed Vitals:   12/09/11 0820 12/09/11 1045 12/09/11 1241 12/09/11 1427  BP: 169/104 134/81 144/79 125/70  Pulse: 79  73   Temp: 98.4 F (36.9 C)  97.9 F (36.6 C)   TempSrc: Oral  Oral   Resp: 23  27   Height:      Weight:      SpO2: 97%  97%     Intake/Output Summary (Last 24 hours) at 12/09/11 1625 Last data filed at 12/09/11 1400  Gross per 24 hour  Intake    859 ml  Output   1475 ml  Net   -616 ml    Exam:   General:  Alert, oriented x 3, no acute distress  Cardiovascular: RRR, no murmurs  Respiratory: mild ronchi b/l   Abdomen: soft, NT, ND, BS+  Ext: b/l UNNA boots  Data Reviewed: Basic Metabolic Panel:  Lab 12/09/11 1027 12/08/11 1312 12/03/11 0835  NA 136 138 137  K 4.1 3.6 2.8*  CL 103 101 103  CO2 21 21 25   GLUCOSE 98 119* 91  BUN 49* 49* 53*  CREATININE 2.77* 2.79* 3.0*  CALCIUM 8.7 9.3 8.7  MG -- -- --  PHOS -- -- --  Liver Function Tests:  Lab 12/09/11 0500 12/08/11 1312  AST 20 28  ALT 11 17  ALKPHOS 94 131*  BILITOT 0.5 0.5  PROT 6.7 8.1  ALBUMIN 2.7* 3.4*   No results found for this basename: LIPASE:5,AMYLASE:5 in the last 168 hours No results found for this basename: AMMONIA:5 in the last 168 hours CBC:  Lab 12/09/11 0500 12/08/11 1312 12/03/11 0835  WBC 16.2* 24.0* 12.6*  NEUTROABS -- 19.9* 10.2*  HGB 10.6* 13.0 11.6*  HCT 32.5* 38.3* 36.3*  MCV 85.3 85.1 88.5  PLT 134* 210 151.0   Cardiac Enzymes:  Lab 12/09/11 0500 12/08/11  2250 12/08/11 1730  CKTOTAL -- -- --  CKMB -- -- --  CKMBINDEX -- -- --  TROPONINI <0.30 <0.30 <0.30   BNP (last 3 results)  Basename 12/09/11 0500 12/03/11 0835 10/05/11 1100  PROBNP 35266.0* 1019.0* 25083.0*   CBG: No results found for this basename: GLUCAP:5 in the last 168 hours  Recent Results (from the past 240 hour(s))  CULTURE, BLOOD (ROUTINE X 2)     Status: Normal (Preliminary result)   Collection Time   12/08/11  1:00 PM      Component Value Range Status Comment   Specimen Description BLOOD LEFT ARM   Final    Special Requests BOTTLES DRAWN AEROBIC AND ANAEROBIC 5CC   Final    Culture  Setup Time 12/08/2011 18:23   Final    Culture     Final    Value:        BLOOD CULTURE RECEIVED NO GROWTH TO DATE CULTURE WILL BE HELD FOR 5 DAYS BEFORE ISSUING A FINAL NEGATIVE REPORT   Report Status PENDING   Incomplete   CULTURE, BLOOD (ROUTINE X 2)     Status: Normal (Preliminary result)   Collection Time   12/08/11  1:15 PM      Component Value Range Status Comment   Specimen Description BLOOD RIGHT ARM   Final    Special Requests BOTTLES DRAWN AEROBIC AND ANAEROBIC 10CC   Final    Culture  Setup Time 12/08/2011 18:23   Final    Culture     Final    Value:        BLOOD CULTURE RECEIVED NO GROWTH TO DATE CULTURE WILL BE HELD FOR 5 DAYS BEFORE ISSUING A FINAL NEGATIVE REPORT   Report Status PENDING   Incomplete   MRSA PCR SCREENING     Status: Normal   Collection Time   12/08/11  6:27 PM      Component Value Range Status Comment   MRSA by PCR NEGATIVE  NEGATIVE Final      Studies: Dg Chest Portable 1 View  12/08/2011  *RADIOLOGY REPORT*  Clinical Data: Shortness of breath.  Weakness.  PORTABLE CHEST - 1 VIEW  Comparison: Chest x-ray 10/03/2011.  Findings: Lung volumes are low.  Retrocardiac opacity on the left may reflect atelectasis and/or consolidation (however, this is likely accentuated by severe underpenetration of the film). Possible small left pleural effusion. There  is cephalization of the pulmonary vasculature and slight indistinctness of the interstitial markings suggestive of mild pulmonary edema.  Mild cardiomegaly is unchanged. The patient is rotated to the left on today's exam, resulting in distortion of the mediastinal contours and reduced diagnostic sensitivity and specificity for mediastinal pathology. Atherosclerosis in the thoracic aorta.  IMPRESSION: 1.  Mild cardiomegaly with mild interstitial pulmonary edema; findings suggestive of congestive heart failure. 2.  Under penetrated film with poor visualization of  the left lower lobe.  Underlying atelectasis and/or consolidation is not excluded in the left lower lobe, however, these findings may alternatively be technique related.  Additionally, the possibility of a small left pleural effusion is not excluded.   Original Report Authenticated By: Trudie Reed, M.D.     Scheduled Meds:   . amiodarone  200 mg Oral Daily  . aspirin  81 mg Oral Daily  . atorvastatin  40 mg Oral Daily  . calcitRIOL  0.25 mcg Oral Daily  . carvedilol  25 mg Oral BID WC  . famotidine  20 mg Oral Daily  . furosemide      . furosemide  60 mg Intravenous Q8H  . hydrALAZINE  100 mg Oral Q8H  . hydrocortisone sod succinate (SOLU-CORTEF) injection  50 mg Intravenous Q12H  . levofloxacin (LEVAQUIN) IV  750 mg Intravenous Q48H  . pantoprazole  40 mg Oral BID AC  . [COMPLETED] piperacillin-tazobactam  3.375 g Intravenous Once  . piperacillin-tazobactam (ZOSYN)  IV  3.375 g Intravenous Q8H  . potassium chloride  20 mEq Oral Daily  . sirolimus  1.5 mg Oral Daily  . sodium chloride  3 mL Intravenous Q12H  . vancomycin  1,500 mg Intravenous Q48H  . [COMPLETED] vancomycin  1,000 mg Intravenous Once  . [DISCONTINUED] furosemide  60 mg Intravenous BID  . [DISCONTINUED] hydrALAZINE  75 mg Oral TID BM  . [DISCONTINUED] isosorbide mononitrate  60 mg Oral Daily  . [DISCONTINUED] levofloxacin (LEVAQUIN) IV  500 mg Intravenous Q24H  .  [DISCONTINUED] piperacillin-tazobactam  3.375 g Intravenous Once  . [DISCONTINUED] sirolimus  1.5 mg Oral Daily  . [DISCONTINUED] sirolimus  1.5 mg Oral Daily   Continuous Infusions:   . nitroGLYCERIN 75 mcg/min (12/09/11 0829)    ________________________________________________________________________  Time spent: 35 min    [facility]  [name] Pager [phone] If 8PM-8AM, please contact night-coverage at [email or web address], password [facility] 12/09/2011, 4:25 PM  LOS: 1 day

## 2011-12-09 NOTE — Progress Notes (Signed)
Patient ID: Chad [name], male   DOB: [birth year], 56 y.o.   MRN: [id number]    SUBJECTIVE: Breathing better this morning.  Creatinine stable.  Afebrile, WBCs down.      Marland Kitchen amiodarone  200 mg Oral Daily  . aspirin  81 mg Oral Daily  . atorvastatin  40 mg Oral Daily  . calcitRIOL  0.25 mcg Oral Daily  . carvedilol  25 mg Oral BID WC  . famotidine  20 mg Oral Daily  . furosemide      . furosemide  60 mg Intravenous Q8H  . hydrALAZINE  100 mg Oral TID BM  . hydrocortisone sod succinate (SOLU-CORTEF) injection  50 mg Intravenous Q12H  . levofloxacin (LEVAQUIN) IV  500 mg Intravenous Q24H  . pantoprazole  40 mg Oral BID AC  . [COMPLETED] piperacillin-tazobactam  3.375 g Intravenous Once  . piperacillin-tazobactam (ZOSYN)  IV  3.375 g Intravenous Q8H  . potassium chloride  20 mEq Oral Daily  . sirolimus  1.5 mg Oral Daily  . sodium chloride  3 mL Intravenous Q12H  . vancomycin  1,500 mg Intravenous Q48H  . [COMPLETED] vancomycin  1,000 mg Intravenous Once  . [DISCONTINUED] furosemide  60 mg Intravenous BID  . [DISCONTINUED] hydrALAZINE  75 mg Oral TID BM  . [DISCONTINUED] isosorbide mononitrate  60 mg Oral Daily  . [DISCONTINUED] piperacillin-tazobactam  3.375 g Intravenous Once  . [DISCONTINUED] sirolimus  1.5 mg Oral Daily  . [DISCONTINUED] sodium chloride  1,000 mL Intravenous Once  NTG gtt to be started at 50 mcg/min   Filed Vitals:   12/08/11 1843 12/08/11 2000 12/08/11 2300 12/09/11 0400  BP: 164/91 132/79 140/83 163/96  Pulse:  79 71 78  Temp:  99.6 F (37.6 C) 98.9 F (37.2 C) 98.2 F (36.8 C)  TempSrc:  Oral Oral Oral  Resp:  33 26 28  Height:      Weight:      SpO2:  98% 98% 97%    Intake/Output Summary (Last 24 hours) at 12/09/11 0752 Last data filed at 12/09/11 0700  Gross per 24 hour  Intake    786 ml  Output    975 ml  Net   -189 ml    LABS: Basic Metabolic Panel:  Basename 12/09/11 0500 12/08/11 1312  NA 136 138  K 4.1 3.6  CL 103 101  CO2 21  21  GLUCOSE 98 119*  BUN 49* 49*  CREATININE 2.77* 2.79*  CALCIUM 8.7 9.3  MG -- --  PHOS -- --   Liver Function Tests:  Basename 12/09/11 0500 12/08/11 1312  AST 20 28  ALT 11 17  ALKPHOS 94 131*  BILITOT 0.5 0.5  PROT 6.7 8.1  ALBUMIN 2.7* 3.4*   No results found for this basename: LIPASE:2,AMYLASE:2 in the last 72 hours CBC:  Basename 12/09/11 0500 12/08/11 1312  WBC 16.2* 24.0*  NEUTROABS -- 19.9*  HGB 10.6* 13.0  HCT 32.5* 38.3*  MCV 85.3 85.1  PLT 134* 210   Cardiac Enzymes:  Basename 12/09/11 0500 12/08/11 2250 12/08/11 1730  CKTOTAL -- -- --  CKMB -- -- --  CKMBINDEX -- -- --  TROPONINI <0.30 <0.30 <0.30   BNP: 35,266  D-Dimer: No results found for this basename: DDIMER:2 in the last 72 hours Hemoglobin A1C: No results found for this basename: HGBA1C in the last 72 hours Fasting Lipid Panel: No results found for this basename: CHOL,HDL,LDLCALC,TRIG,CHOLHDL,LDLDIRECT in the last 72 hours Thyroid Function Tests: No results found  for this basename: TSH,T4TOTAL,FREET3,T3FREE,THYROIDAB in the last 72 hours Anemia Panel: No results found for this basename: VITAMINB12,FOLATE,FERRITIN,TIBC,IRON,RETICCTPCT in the last 72 hours  RADIOLOGY: Dg Chest Portable 1 View  12/08/2011  *RADIOLOGY REPORT*  Clinical Data: Shortness of breath.  Weakness.  PORTABLE CHEST - 1 VIEW  Comparison: Chest x-ray 10/03/2011.  Findings: Lung volumes are low.  Retrocardiac opacity on the left may reflect atelectasis and/or consolidation (however, this is likely accentuated by severe underpenetration of the film). Possible small left pleural effusion. There is cephalization of the pulmonary vasculature and slight indistinctness of the interstitial markings suggestive of mild pulmonary edema.  Mild cardiomegaly is unchanged. The patient is rotated to the left on today's exam, resulting in distortion of the mediastinal contours and reduced diagnostic sensitivity and specificity for  mediastinal pathology. Atherosclerosis in the thoracic aorta.  IMPRESSION: 1.  Mild cardiomegaly with mild interstitial pulmonary edema; findings suggestive of congestive heart failure. 2.  Under penetrated film with poor visualization of the left lower lobe.  Underlying atelectasis and/or consolidation is not excluded in the left lower lobe, however, these findings may alternatively be technique related.  Additionally, the possibility of a small left pleural effusion is not excluded.   Original Report Authenticated By: Trudie Reed, M.D.     PHYSICAL EXAM General: NAD Neck: JVP 12-14 cm, no thyromegaly or thyroid nodule.  Lungs: Rhonchi bilaterally CV: Nondisplaced PMI.  Heart regular S1/S2, no S3/S4, no murmur.  Legs wrapped but appear to have minimal edema.  No carotid bruit.  Normal pedal pulses.  Abdomen: Soft, nontender, no hepatosplenomegaly, no distention.  Neurologic: Alert and oriented x 3.  Psych: Normal affect. Extremities: No clubbing or cyanosis.   TELEMETRY: Reviewed telemetry pt in NSR  ASSESSMENT AND PLAN:  56 yo with h/o PNA/septic shock, suspected nonischemic cardiomyopathy, CKD s/p renal transplant presented with respiratory distress with acute/chronic CHF exacerbation as well as possible recurrent PNA.  1. CHF: Acute on chronic systolic CHF.  EF 30-35% on last echo.  He does appear volume overloaded on exam and thinks that he has gained weight.  BNP very high, some pulmonary edema on CXR. Suspect CHF exacerbation may have been triggered by combination of lung infection and hypertensive urgency.  - Increase Lasix to 60 mg IV every 8 hrs, following renal fxn closely.  - Will start NTG gtt for afterload reduction.  Start at 50 mcg/min and titrate up, goal 150-200 mcg/min keeping SBP no lower than about 120.  2. HTN: Admitted with hypertensive urgency/emergency and pulmonary edema.  As above, adding NTG gtt (and stopping Imdur for now).  Increase hydralazine to 100 mg tid.    3. ID: WBCs very high at admission, concern for recurrent LLL PNA.  He has not been febrile but has been on steroids.  He is being covered with antibiotics and stress dose hydrocortisone per hospitalist.  4. Renal: h/o renal transplant.  Function stable overnight, follow closely.   Marca Ancona 12/09/2011 8:00 AM

## 2011-12-09 NOTE — Progress Notes (Signed)
Nutrition Brief Note  Patient identified on the Malnutrition Screening Tool (MST) Report. Per patient, he has lost approximately 60 - 100 lb since May hospitalization, however records show that patient's usual weight at that time was on average 277 - 280 lb; however this weight would fluctuate with fluid. Current weight is 249 lb, and he says he has weighed this for at least one month. He notes that he has been eating very well during this time. RD saw breakfast tray, pt consumed 100%. States he follows a low sodium diet at home.  Body mass index is 32.93 kg/(m^2). Pt meets criteria for Obese Class I based on current BMI.   Current diet order is Heart Healthy, patient is consuming approximately 100% of meals at this time. Labs and medications reviewed.   No nutrition interventions warranted at this time. If nutrition issues arise, please consult RD.   Chad Motto MS, RD, LDN Pager: [phone] After-hours pager: [phone]

## 2011-12-10 DIAGNOSIS — N179 Acute kidney failure, unspecified: Secondary | ICD-10-CM

## 2011-12-10 LAB — CBC
HCT: 32.5 % — ABNORMAL LOW (ref 39.0–52.0)
HCT: 29 % — ABNORMAL LOW (ref 39.0–52.0)
Hemoglobin: 9.4 g/dL — ABNORMAL LOW (ref 13.0–17.0)
MCHC: 32.6 g/dL (ref 30.0–36.0)
MCHC: 32.4 g/dL (ref 30.0–36.0)
MCV: 85.3 fL (ref 78.0–100.0)
Platelets: 134 10*3/uL — ABNORMAL LOW (ref 150–400)
RDW: 16.7 % — ABNORMAL HIGH (ref 11.5–15.5)
WBC: 16.2 10*3/uL — ABNORMAL HIGH (ref 4.0–10.5)
WBC: 12.5 10*3/uL — ABNORMAL HIGH (ref 4.0–10.5)

## 2011-12-10 LAB — BASIC METABOLIC PANEL
CO2: 23 mEq/L (ref 19–32)
Calcium: 8.3 mg/dL — ABNORMAL LOW (ref 8.4–10.5)
Chloride: 100 mEq/L (ref 96–112)
GFR calc Af Amer: 24 mL/min — ABNORMAL LOW (ref >90)
GFR calc non Af Amer: 21 mL/min — ABNORMAL LOW (ref >90)
GFR calc non Af Amer: 22 mL/min — ABNORMAL LOW (ref >90)
Glucose, Bld: 159 mg/dL — ABNORMAL HIGH (ref 70–99)
Potassium: 4.1 mEq/L (ref 3.5–5.1)
Potassium: 3.6 mEq/L (ref 3.5–5.1)
Sodium: 135 mEq/L (ref 135–145)
Sodium: 132 mEq/L — ABNORMAL LOW (ref 135–145)

## 2011-12-10 MED ORDER — FUROSEMIDE 10 MG/ML IJ SOLN
INTRAMUSCULAR | Status: AC
  Filled 2011-12-10: qty 8

## 2011-12-10 MED ORDER — IPRATROPIUM BROMIDE 0.06 % NA SOLN
2.0000 | Freq: Three times a day (TID) | NASAL | Status: DC
  Administered 2011-12-10 – 2011-12-11 (×3): 2 via NASAL
  Filled 2011-12-10 (×2): qty 15

## 2011-12-10 NOTE — Progress Notes (Signed)
[name] [place] TEAM 1 - Stepdown/ICU TEAM  Chad [name] ZOX:[id number] DOB: [birth year] DOA: 12/08/2011 PCP: [name], MD  Brief narrative: 56 year old male, with rather complex medical history, including renal transplant 1996, HCV, paroxysmal atrial flutter, dyslipidemia and chronic systolic CHF/NICM, OSA and history of GI bleed. In 05/2011 he developed parainfluenza pneumonia that progressed to ARDS, septic shock and was briefly on CVVH. 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 Following recovery, he went to inpatient rehab until 06/2011. In 07/2011, he was re-hospitalized with a lower leg wound infection secondary to Proteus and Serratia. He is now followed in the wound clinic. His most recent hospitalization was 10/02/11-10/15/11 for acute on chronic diastolic CHF. Repeat echo showed EF 30-35%. Lexiscan myoview showed EF 42% with no ischemia or infarction. According to patient, he was quite well all day on 12/07/11, went to bed at his usual tome, and awoke suddenly with shortness of breath at about 3:00 AM on 12/08/11. He denied chest pain, or fever, had a mild cough, productive of clear phlegm. He vomited twice, had loose bowel movements, but no diarrhea. In the ED, patient was found to be tachypneic, with an O2 sat of 82%-85% on RA. He was placed on 15L via NRB.   Assessment/Plan:  Acute respiratory failure with hypoxia Due to acure CHF exacerbation and Pneumonia - see below  NICM / acute on chronic systolic CHF exacerbation Appreciate cardiology management - EF 35% with PA systolic pressure 70 mmHg on echo   ?Pneumonia - LLL - healthcare associated (hospitalized in Sept 2013) Cont current antibiotics for short 5 day course - f/u sputum culture - clinically stabilizing  Sinus congestion/ sinusitis? xray of sinuses is w/o evidence to suggest a signif sinusitis/sinus infection - will d/c afrin - cont nasal saline prn - follow  sx  PAF (paroxysmal atrial fibrillation) Currently NSR - on Amiodarone  History of renal transplant with acute on chronic renal failure Baseline crt ~3.1 - On immunosuppressives - Prednisone 10 mg daily at home- on stress dose Hydrocortisone now - follow crt trend - if worsens will consult his Nephrologist  HTN (hypertension) Reasonably controlled at present time - cont to follow trend   Code Status: full code  Disposition Plan: follow in SDU - possible tele in AM DVT prophylaxis: Lovenox  Consultants:  [facility]  Procedures:  none  Antibiotics:  Vanc, Zosyn and Levaquin - 11/11>>  HPI/Subjective: Pressure between eyes and sinus congestion completely resolved w/ afrin.  No chest pain.  Denies current sob, f/c, n/v, or abdom pain.    Objective: Filed Vitals:   12/10/11 0606 12/10/11 0727 12/10/11 1233 12/10/11 1352  BP: 149/94 145/85 137/87 131/77  Pulse:  78 69   Temp:  98.2 F (36.8 C) 98.1 F (36.7 C)   TempSrc:  Oral Oral   Resp:  20 24   Height:      Weight:      SpO2:  99% 96%     Intake/Output Summary (Last 24 hours) at 12/10/11 1456 Last data filed at 12/10/11 1300  Gross per 24 hour  Intake 2150.5 ml  Output   2200 ml  Net  -49.5 ml    Exam:  General: No acute respiratory distress at rest Lungs: mild bibasilar crackles - no wheeze  Cardiovascular: Regular rate and rhythm w/ 3/6 holosystolic M  Abdomen: overweight, nontender, nondistended, soft, bowel sounds positive, no rebound, no ascites, no appreciable mass Extremities:  No significant cyanosis, clubbing, or edema bilateral lower extremities - unna boots to B LE dry and clean   Data Reviewed: Basic Metabolic Panel:  Lab 12/10/11 1610 12/09/11 0500 12/08/11 1312  NA 135 136 138  K 4.1 4.1 3.6  CL 100 103 101  CO2 22 21 21   GLUCOSE 151* 98 119*  BUN 54* 49* 49*  CREATININE 3.11* 2.77* 2.79*  CALCIUM 8.4 8.7 9.3  MG -- -- --  PHOS -- -- --   Liver Function Tests:  Lab  12/09/11 0500 12/08/11 1312  AST 20 28  ALT 11 17  ALKPHOS 94 131*  BILITOT 0.5 0.5  PROT 6.7 8.1  ALBUMIN 2.7* 3.4*   CBC:  Lab 12/10/11 0515 12/09/11 0500 12/08/11 1312  WBC 12.5* 16.2* 24.0*  NEUTROABS -- -- 19.9*  HGB 9.4* 10.6* 13.0  HCT 29.0* 32.5* 38.3*  MCV 85.0 85.3 85.1  PLT 147* 134* 210   Cardiac Enzymes:  Lab 12/09/11 0500 12/08/11 2250 12/08/11 1730  CKTOTAL -- -- --  CKMB -- -- --  CKMBINDEX -- -- --  TROPONINI <0.30 <0.30 <0.30   BNP (last 3 results)  Basename 12/09/11 0500 12/03/11 0835 10/05/11 1100  PROBNP 35266.0* 1019.0* 25083.0*   Recent Results (from the past 240 hour(s))  CULTURE, BLOOD (ROUTINE X 2)     Status: Normal (Preliminary result)   Collection Time   12/08/11  1:00 PM      Component Value Range Status Comment   Specimen Description BLOOD LEFT ARM   Final    Special Requests BOTTLES DRAWN AEROBIC AND ANAEROBIC 5CC   Final    Culture  Setup Time 12/08/2011 18:23   Final    Culture     Final    Value:        BLOOD CULTURE RECEIVED NO GROWTH TO DATE CULTURE WILL BE HELD FOR 5 DAYS BEFORE ISSUING A FINAL NEGATIVE REPORT   Report Status PENDING   Incomplete   CULTURE, BLOOD (ROUTINE X 2)     Status: Normal (Preliminary result)   Collection Time   12/08/11  1:15 PM      Component Value Range Status Comment   Specimen Description BLOOD RIGHT ARM   Final    Special Requests BOTTLES DRAWN AEROBIC AND ANAEROBIC 10CC   Final    Culture  Setup Time 12/08/2011 18:23   Final    Culture     Final    Value:        BLOOD CULTURE RECEIVED NO GROWTH TO DATE CULTURE WILL BE HELD FOR 5 DAYS BEFORE ISSUING A FINAL NEGATIVE REPORT   Report Status PENDING   Incomplete   MRSA PCR SCREENING     Status: Normal   Collection Time   12/08/11  6:27 PM      Component Value Range Status Comment   MRSA by PCR NEGATIVE  NEGATIVE Final      Studies: Dg Chest Portable 1 View  12/08/2011  *RADIOLOGY REPORT*  Clinical Data: Shortness of breath.  Weakness.   PORTABLE CHEST - 1 VIEW  Comparison: Chest x-ray 10/03/2011.  IMPRESSION: 1.  Mild cardiomegaly with mild interstitial pulmonary edema; findings suggestive of congestive heart failure. 2.  Under penetrated film with poor visualization of the left lower lobe.  Underlying atelectasis and/or consolidation is not excluded in the left lower lobe, however, these findings may alternatively be technique related.  Additionally, the possibility of a small left pleural effusion is not excluded.   Original  Report Authenticated By: Trudie Reed, M.D.    Scheduled Meds: Reviewed by attending MD ________________________________________________________________________  Lonia Blood, MD [name] Office  [phone] Pager [phone]  On-Call/Text Page:      [email or web address]      password [facility]  12/10/2011, 2:56 PM  LOS: 2 days

## 2011-12-10 NOTE — Progress Notes (Signed)
Patient ID: Chad [name], male   DOB: 28-Aug-1955, 56 y.o.   MRN: [id number]     SUBJECTIVE: Breathing better this morning.  He was given Afrin nasal spray yesterday and feels like sinuses have cleared up.      Marland Kitchen amiodarone  200 mg Oral Daily  . aspirin  81 mg Oral Daily  . atorvastatin  40 mg Oral Daily  . calcitRIOL  0.25 mcg Oral Daily  . carvedilol  25 mg Oral BID WC  . enoxaparin (LOVENOX) injection  30 mg Subcutaneous Q24H  . famotidine  20 mg Oral Daily  . furosemide      . furosemide      . furosemide  60 mg Intravenous Q8H  . hydrALAZINE  100 mg Oral Q8H  . hydrocortisone sod succinate (SOLU-CORTEF) injection  50 mg Intravenous Q12H  . levofloxacin (LEVAQUIN) IV  750 mg Intravenous Q48H  . oxymetazoline  1 spray Each Nare BID  . pantoprazole  40 mg Oral BID AC  . piperacillin-tazobactam (ZOSYN)  IV  3.375 g Intravenous Q8H  . potassium chloride  20 mEq Oral Daily  . sirolimus  1.5 mg Oral Daily  . sodium chloride  3 mL Intravenous Q12H  . vancomycin  1,500 mg Intravenous Q48H  . [DISCONTINUED] enoxaparin (LOVENOX) injection  40 mg Subcutaneous Q24H  . [DISCONTINUED] furosemide  60 mg Intravenous BID  . [DISCONTINUED] hydrALAZINE  75 mg Oral TID BM  . [DISCONTINUED] isosorbide mononitrate  60 mg Oral Daily  . [DISCONTINUED] levofloxacin (LEVAQUIN) IV  500 mg Intravenous Q24H  . [DISCONTINUED] sirolimus  1.5 mg Oral Daily  NTG gtt @ 150 mcg/min  Filed Vitals:   12/09/11 2130 12/10/11 0000 12/10/11 0400 12/10/11 0606  BP: 123/75 142/87 137/72 149/94  Pulse:  76 74   Temp:  98.1 F (36.7 C) 97.5 F (36.4 C)   TempSrc:  Oral Oral   Resp:  22 22   Height:      Weight:   257 lb 4.4 oz (116.7 kg)   SpO2:  97% 96%     Intake/Output Summary (Last 24 hours) at 12/10/11 0658 Last data filed at 12/10/11 0616  Gross per 24 hour  Intake 1374.63 ml  Output   2250 ml  Net -875.37 ml    LABS: Basic Metabolic Panel:  Basename 12/10/11 0515 12/09/11 0500  NA 135  136  K 4.1 4.1  CL 100 103  CO2 22 21  GLUCOSE 151* 98  BUN 54* 49*  CREATININE 3.11* 2.77*  CALCIUM 8.4 8.7  MG -- --  PHOS -- --   Liver Function Tests:  Basename 12/09/11 0500 12/08/11 1312  AST 20 28  ALT 11 17  ALKPHOS 94 131*  BILITOT 0.5 0.5  PROT 6.7 8.1  ALBUMIN 2.7* 3.4*   No results found for this basename: LIPASE:2,AMYLASE:2 in the last 72 hours CBC:  Basename 12/10/11 0515 12/09/11 0500 12/08/11 1312  WBC 12.5* 16.2* --  NEUTROABS -- -- 19.9*  HGB 9.4* 10.6* --  HCT 29.0* 32.5* --  MCV 85.0 85.3 --  PLT 147* 134* --   Cardiac Enzymes:  Basename 12/09/11 0500 12/08/11 2250 12/08/11 1730  CKTOTAL -- -- --  CKMB -- -- --  CKMBINDEX -- -- --  TROPONINI <0.30 <0.30 <0.30   BNP: 35,266  PCT: 4.48  RADIOLOGY: Dg Chest Portable 1 View  12/08/2011  *RADIOLOGY REPORT*  Clinical Data: Shortness of breath.  Weakness.  PORTABLE CHEST - 1 VIEW  Comparison: Chest x-ray 10/03/2011.  Findings: Lung volumes are low.  Retrocardiac opacity on the left may reflect atelectasis and/or consolidation (however, this is likely accentuated by severe underpenetration of the film). Possible small left pleural effusion. There is cephalization of the pulmonary vasculature and slight indistinctness of the interstitial markings suggestive of mild pulmonary edema.  Mild cardiomegaly is unchanged. The patient is rotated to the left on today's exam, resulting in distortion of the mediastinal contours and reduced diagnostic sensitivity and specificity for mediastinal pathology. Atherosclerosis in the thoracic aorta.  IMPRESSION: 1.  Mild cardiomegaly with mild interstitial pulmonary edema; findings suggestive of congestive heart failure. 2.  Under penetrated film with poor visualization of the left lower lobe.  Underlying atelectasis and/or consolidation is not excluded in the left lower lobe, however, these findings may alternatively be technique related.  Additionally, the possibility of a  small left pleural effusion is not excluded.   Original Report Authenticated By: Trudie Reed, M.D.     PHYSICAL EXAM General: NAD Neck: JVP 12 cm, no thyromegaly or thyroid nodule.  Lungs: Clear bilaterally CV: Nondisplaced PMI.  Heart regular S1/S2, no S3/S4, 2/6 SEM RUSB.  Legs wrapped but appear to have minimal edema.  No carotid bruit.  Normal pedal pulses.  Abdomen: Soft, nontender, no hepatosplenomegaly, no distention.  Neurologic: Alert and oriented x 3.  Psych: Normal affect. Extremities: No clubbing or cyanosis.   TELEMETRY: Reviewed telemetry pt in NSR  ASSESSMENT AND PLAN:  56 yo with h/o PNA/septic shock, suspected nonischemic cardiomyopathy, CKD s/p renal transplant presented with respiratory distress with acute/chronic CHF exacerbation as well as possible recurrent PNA.  1. CHF: Acute on chronic systolic CHF.  EF 35% with PA systolic pressure 70 mmHg on echo yesterday.  He still appears volume overloaded on exam.  Some diuresis yesterday with IV Lasix. Suspect CHF exacerbation may have been triggered by combination of lung infection and hypertensive urgency. Creatinine rose to 3.1 today.  - Would continue Lasix at 60 mg IV every 8 hrs given continued volume overload.  His creatinine has been around 3.1 historically prior to admission but will need to watch closely.  Will repeat BMET this afternoon and if creatinine continues to rise, would hold IV Lasix.  - Continue NTG gtt at 150 mcg/min for afterload reduction for now.   2. HTN: Admitted with hypertensive urgency/emergency and pulmonary edema.  On NTG gtt for now, when comes off gtt will need to add amlodipine in addition to other meds.  Would be careful with Afrin given hypertensive urgency/emergency, Atrovent nasal spray may be better option in this situation.  3. ID: WBCs very high at admission with elevated procalcitonin, concern for recurrent LLL PNA but no definite infiltrate on CXR.  Acute sinusitis may be the actual  diagnosis.  He has not been febrile but has been on steroids.  He is being covered with antibiotics and stress dose hydrocortisone per hospitalist.  4. Renal: h/o renal transplant.  Follow closely today, repeat creatinine this afternoon and hold IV Lasix if it significantly rises.   Marca Ancona 12/10/2011 6:58 AM

## 2011-12-10 NOTE — Progress Notes (Signed)
Pharmacist Heart Failure Core Measure Documentation  Assessment: Chad [name] has an EF documented as 35% on 12/09/11 by 2D ECHO.  Rationale: Heart failure patients with left ventricular systolic dysfunction (LVSD) and an EF < 40% should be prescribed an angiotensin converting enzyme inhibitor (ACEI) or angiotensin receptor blocker (ARB) at discharge unless a contraindication is documented in the medical record.  This patient is not currently on an ACEI or ARB for HF.  This note is being placed in the record in order to provide documentation that a contraindication to the use of these agents is present for this encounter.  ACE Inhibitor or Angiotensin Receptor Blocker is contraindicated (specify all that apply)  []   ACEI allergy AND ARB allergy []   Angioedema []   Moderate or severe aortic stenosis []   Hyperkalemia []   Hypotension []   Renal artery stenosis [x]   Worsening renal function, preexisting renal disease or dysfunction   Mickeal Skinner 12/10/2011 8:30 AM

## 2011-12-10 NOTE — Progress Notes (Signed)
Patient is breathing well, lungs is clear, O2 discontinued with good O2 saturation.  Patient denies of SOB or pain. Nitroglycerin drip at 150 mcg.

## 2011-12-11 LAB — BASIC METABOLIC PANEL
Calcium: 8.2 mg/dL — ABNORMAL LOW (ref 8.4–10.5)
Chloride: 100 mEq/L (ref 96–112)
Creatinine, Ser: 3.1 mg/dL — ABNORMAL HIGH (ref 0.50–1.35)
Glucose, Bld: 129 mg/dL — ABNORMAL HIGH (ref 70–99)
Potassium: 3.3 mEq/L — ABNORMAL LOW (ref 3.5–5.1)
Sodium: 135 mEq/L (ref 135–145)

## 2011-12-11 LAB — PROCALCITONIN: Procalcitonin: 3.47 ng/mL

## 2011-12-11 MED ORDER — ISOSORBIDE MONONITRATE ER 30 MG PO TB24: 90.0000 mg | ORAL_TABLET | Freq: Every day | ORAL | Status: DC

## 2011-12-11 MED ORDER — POTASSIUM CHLORIDE CRYS ER 20 MEQ PO TBCR: 20.0000 meq | EXTENDED_RELEASE_TABLET | Freq: Two times a day (BID) | ORAL | Status: DC

## 2011-12-11 MED ORDER — FUROSEMIDE 40 MG PO TABS: 60.0000 mg | ORAL_TABLET | Freq: Two times a day (BID) | ORAL | Status: DC

## 2011-12-11 MED ORDER — FUROSEMIDE 20 MG PO TABS
60.0000 mg | ORAL_TABLET | Freq: Two times a day (BID) | ORAL | Status: DC
  Administered 2011-12-11: 60 mg via ORAL
  Filled 2011-12-11 (×4): qty 1

## 2011-12-11 MED ORDER — AMLODIPINE BESYLATE 5 MG PO TABS: 5.0000 mg | ORAL_TABLET | Freq: Every day | ORAL | Status: DC

## 2011-12-11 MED ORDER — POTASSIUM CHLORIDE CRYS ER 20 MEQ PO TBCR
40.0000 meq | EXTENDED_RELEASE_TABLET | Freq: Once | ORAL | Status: AC
  Administered 2011-12-11: 40 meq via ORAL
  Filled 2011-12-11: qty 2

## 2011-12-11 MED ORDER — AMLODIPINE BESYLATE 5 MG PO TABS
5.0000 mg | ORAL_TABLET | Freq: Every day | ORAL | Status: DC
  Administered 2011-12-11: 5 mg via ORAL
  Filled 2011-12-11: qty 1

## 2011-12-11 MED ORDER — ENOXAPARIN SODIUM 40 MG/0.4ML ~~LOC~~ SOLN
40.0000 mg | SUBCUTANEOUS | Status: DC
  Filled 2011-12-11: qty 0.4

## 2011-12-11 MED ORDER — CARVEDILOL 25 MG PO TABS: 25.0000 mg | ORAL_TABLET | Freq: Two times a day (BID) | ORAL | Status: DC

## 2011-12-11 MED ORDER — POTASSIUM CHLORIDE 20 MEQ PO PACK: 40.0000 meq | PACK | Freq: Once | ORAL | Status: DC

## 2011-12-11 MED ORDER — LEVOFLOXACIN 750 MG PO TABS: 750.0000 mg | ORAL_TABLET | ORAL | Status: DC

## 2011-12-11 MED ORDER — ISOSORBIDE MONONITRATE ER 60 MG PO TB24
90.0000 mg | ORAL_TABLET | Freq: Every day | ORAL | Status: DC
  Administered 2011-12-11: 90 mg via ORAL
  Filled 2011-12-11: qty 1

## 2011-12-11 NOTE — Progress Notes (Signed)
12/11/2011 patient transfer  from 2600 to 6700, at 1015. He is alert, oriented and ambulatory.  Patient have wounds on bilateral legs have unna boots on both. Patient  abdomen have stretch mark, pinkish area. Lower legs look swollen. Patient was placed on telemetry. Gloriajean Dell RN

## 2011-12-11 NOTE — ED Provider Notes (Signed)
Medical screening examination/treatment/procedure(s) were performed by non-physician practitioner and as supervising physician I was immediately available for consultation/collaboration.   Gerhard Munch, MD 12/11/11 [phone]

## 2011-12-11 NOTE — Progress Notes (Signed)
ANTIBIOTIC CONSULT NOTE - FOLLOW UP  Pharmacy Consult for Vancomycin, Zosyn, and Levaquin Indication: pneumonia  No Known Allergies  Patient Measurements: Height: 6\' 1"  (185.4 cm) Weight: 233 lb 11 oz (106 kg) IBW/kg (Calculated) : 79.9   Vital Signs: Temp: 97.3 F (36.3 C) (11/14 0731) Temp src: Oral (11/14 0731) BP: 144/78 mmHg (11/14 0918) Pulse Rate: 75  (11/14 0731) Intake/Output from previous day: 11/13 0701 - 11/14 0700 In: 2452 [P.O.:1080; I.V.:1316; IV Piggyback:56] Out: 2686 [Urine:2685; Stool:1] Intake/Output from this shift: Total I/O In: 603 [P.O.:600; I.V.:3] Out: 500 [Urine:500]  Labs:  [facility] 12/11/11 0450 12/10/11 1644 12/10/11 0515 12/09/11 0500 12/08/11 1312  WBC -- -- 12.5* 16.2* 24.0*  HGB -- -- 9.4* 10.6* 13.0  PLT -- -- 147* 134* 210  LABCREA -- -- -- -- --  CREATININE 3.10* 3.04* 3.11* -- --   Estimated Creatinine Clearance: 34.4 ml/min (by C-G formula based on Cr of 3.1). No results found for this basename: VANCOTROUGH:2,VANCOPEAK:2,VANCORANDOM:2,GENTTROUGH:2,GENTPEAK:2,GENTRANDOM:2,TOBRATROUGH:2,TOBRAPEAK:2,TOBRARND:2,AMIKACINPEAK:2,AMIKACINTROU:2,AMIKACIN:2, in the last 72 hours   Microbiology: Recent Results (from the past 720 hour(s))  CULTURE, BLOOD (ROUTINE X 2)     Status: Normal (Preliminary result)   Collection Time   12/08/11  1:00 PM      Component Value Range Status Comment   Specimen Description BLOOD LEFT ARM   Final    Special Requests BOTTLES DRAWN AEROBIC AND ANAEROBIC 5CC   Final    Culture  Setup Time 12/08/2011 18:23   Final    Culture     Final    Value:        BLOOD CULTURE RECEIVED NO GROWTH TO DATE CULTURE WILL BE HELD FOR 5 DAYS BEFORE ISSUING A FINAL NEGATIVE REPORT   Report Status PENDING   Incomplete   CULTURE, BLOOD (ROUTINE X 2)     Status: Normal (Preliminary result)   Collection Time   12/08/11  1:15 PM      Component Value Range Status Comment   Specimen Description BLOOD RIGHT ARM   Final    Special  Requests BOTTLES DRAWN AEROBIC AND ANAEROBIC 10CC   Final    Culture  Setup Time 12/08/2011 18:23   Final    Culture     Final    Value:        BLOOD CULTURE RECEIVED NO GROWTH TO DATE CULTURE WILL BE HELD FOR 5 DAYS BEFORE ISSUING A FINAL NEGATIVE REPORT   Report Status PENDING   Incomplete   MRSA PCR SCREENING     Status: Normal   Collection Time   12/08/11  6:27 PM      Component Value Range Status Comment   MRSA by PCR NEGATIVE  NEGATIVE Final     Anti-infectives     Start     Dose/Rate Route Frequency Ordered Stop   12/10/11 1959   levofloxacin (LEVAQUIN) IVPB 750 mg     Comments: Per pharmacy      750 mg 100 mL/hr over 90 Minutes Intravenous Every 48 hours 12/09/11 0957 12/15/11 1959   12/08/11 1830   levofloxacin (LEVAQUIN) IVPB 500 mg  Status:  Discontinued     Comments: Per pharmacy      500 mg 100 mL/hr over 60 Minutes Intravenous Every 24 hours 12/08/11 1713 12/09/11 0957   12/08/11 1800   vancomycin (VANCOCIN) 1,500 mg in sodium chloride 0.9 % 500 mL IVPB        1,500 mg 250 mL/hr over 120 Minutes Intravenous Every 48 hours 12/08/11 1750 12/13/11  1759   12/08/11 1800  piperacillin-tazobactam (ZOSYN) IVPB 3.375 g       3.375 g 100 mL/hr over 30 Minutes Intravenous  Once 12/08/11 1751 12/08/11 1929   12/08/11 1445   piperacillin-tazobactam (ZOSYN) IVPB 3.375 g  Status:  Discontinued        3.375 g 100 mL/hr over 30 Minutes Intravenous  Once 12/08/11 1433 12/08/11 1713   12/08/11 1445   vancomycin (VANCOCIN) IVPB 1000 mg/200 mL premix        1,000 mg 200 mL/hr over 60 Minutes Intravenous  Once 12/08/11 1433 12/08/11 1748   12/08/11 0400  piperacillin-tazobactam (ZOSYN) IVPB 3.375 g       3.375 g 12.5 mL/hr over 240 Minutes Intravenous 3 times per day 12/08/11 1752 12/13/11 1359          Assessment: Pneumonia:  On Day #4 of empiric therapy with Vancomycin, Zosyn, and Levaquin.  Doses are appropriate for renal function (transplant patient).  Note stop dates  are in place for all antibiotics.  Goal of Therapy:  Vancomycin trough level 15-20 mcg/ml  Plan:  Continue Vancomycin, Zosyn, and Levaquin at current doses. Will not check Vancomycin trough at this time as his last dose will be administered on 11/15. Monitor renal function, micro data, and clinical condition.  Estella Husk, Pharm.D., BCPS Clinical Pharmacist  Phone [phone] Pager [phone] 12/11/2011, 9:55 AM

## 2011-12-11 NOTE — Progress Notes (Signed)
Patient ID: Chad [name], male   DOB: Feb 04, 1955, 56 y.o.   MRN: [id number]     SUBJECTIVE: Feeling much better, breathing back to baseline.  Walked in halls.  Diuresis was not vigorous by recorded I/Os but weight is down.      Marland Kitchen amiodarone  200 mg Oral Daily  . aspirin  81 mg Oral Daily  . atorvastatin  40 mg Oral Daily  . calcitRIOL  0.25 mcg Oral Daily  . carvedilol  25 mg Oral BID WC  . enoxaparin (LOVENOX) injection  30 mg Subcutaneous Q24H  . famotidine  20 mg Oral Daily  . furosemide      . furosemide  60 mg Intravenous Q8H  . hydrALAZINE  100 mg Oral Q8H  . hydrocortisone sod succinate (SOLU-CORTEF) injection  50 mg Intravenous Q12H  . ipratropium  2 spray Each Nare TID  . levofloxacin (LEVAQUIN) IV  750 mg Intravenous Q48H  . pantoprazole  40 mg Oral BID AC  . piperacillin-tazobactam (ZOSYN)  IV  3.375 g Intravenous Q8H  . potassium chloride  40 mEq Oral Once  . potassium chloride  20 mEq Oral Daily  . sirolimus  1.5 mg Oral Daily  . sodium chloride  3 mL Intravenous Q12H  . vancomycin  1,500 mg Intravenous Q48H    Filed Vitals:   12/10/11 2317 12/11/11 0338 12/11/11 0347 12/11/11 0500  BP: 129/71 129/72    Pulse: 74 73    Temp:   97.8 F (36.6 C)   TempSrc:   Oral   Resp: 18 28    Height:      Weight:    233 lb 11 oz (106 kg)  SpO2: 96% 95%      Intake/Output Summary (Last 24 hours) at 12/11/11 0727 Last data filed at 12/11/11 0600  Gross per 24 hour  Intake   2397 ml  Output   2686 ml  Net   -289 ml    LABS: Basic Metabolic Panel:  Basename 12/11/11 0450 12/10/11 1644  NA 135 132*  K 3.3* 3.6  CL 100 98  CO2 23 23  GLUCOSE 129* 159*  BUN 55* 56*  CREATININE 3.10* 3.04*  CALCIUM 8.2* 8.3*  MG -- --  PHOS -- --   Liver Function Tests:  Basename 12/09/11 0500 12/08/11 1312  AST 20 28  ALT 11 17  ALKPHOS 94 131*  BILITOT 0.5 0.5  PROT 6.7 8.1  ALBUMIN 2.7* 3.4*   No results found for this basename: LIPASE:2,AMYLASE:2 in the last 72  hours CBC:  Basename 12/10/11 0515 12/09/11 0500 12/08/11 1312  WBC 12.5* 16.2* --  NEUTROABS -- -- 19.9*  HGB 9.4* 10.6* --  HCT 29.0* 32.5* --  MCV 85.0 85.3 --  PLT 147* 134* --   Cardiac Enzymes:  Basename 12/09/11 0500 12/08/11 2250 12/08/11 1730  CKTOTAL -- -- --  CKMB -- -- --  CKMBINDEX -- -- --  TROPONINI <0.30 <0.30 <0.30   BNP: 35,266  PCT: 4.48  RADIOLOGY: Dg Chest Portable 1 View  12/08/2011  *RADIOLOGY REPORT*  Clinical Data: Shortness of breath.  Weakness.  PORTABLE CHEST - 1 VIEW  Comparison: Chest x-ray 10/03/2011.  Findings: Lung volumes are low.  Retrocardiac opacity on the left may reflect atelectasis and/or consolidation (however, this is likely accentuated by severe underpenetration of the film). Possible small left pleural effusion. There is cephalization of the pulmonary vasculature and slight indistinctness of the interstitial markings suggestive of mild pulmonary edema.  Mild cardiomegaly is unchanged. The patient is rotated to the left on today's exam, resulting in distortion of the mediastinal contours and reduced diagnostic sensitivity and specificity for mediastinal pathology. Atherosclerosis in the thoracic aorta.  IMPRESSION: 1.  Mild cardiomegaly with mild interstitial pulmonary edema; findings suggestive of congestive heart failure. 2.  Under penetrated film with poor visualization of the left lower lobe.  Underlying atelectasis and/or consolidation is not excluded in the left lower lobe, however, these findings may alternatively be technique related.  Additionally, the possibility of a small left pleural effusion is not excluded.   Original Report Authenticated By: Trudie Reed, M.D.     PHYSICAL EXAM General: NAD Neck: JVP 8-9 cm, no thyromegaly or thyroid nodule.  Lungs: Clear bilaterally CV: Nondisplaced PMI.  Heart regular S1/S2, no S3/S4, 2/6 SEM RUSB.  Legs wrapped but appear to have minimal edema.  No carotid bruit.  Normal pedal pulses.   Abdomen: Soft, nontender, no hepatosplenomegaly, no distention.  Neurologic: Alert and oriented x 3.  Psych: Normal affect. Extremities: No clubbing or cyanosis.   TELEMETRY: Reviewed telemetry pt in NSR  ASSESSMENT AND PLAN:  56 yo with h/o PNA/septic shock, suspected nonischemic cardiomyopathy, CKD s/p renal transplant presented with respiratory distress with acute/chronic CHF exacerbation as well as possible recurrent PNA.  1. CHF: Acute on chronic systolic CHF.  EF 35% with PA systolic pressure 70 mmHg on echo yesterday.  Clinically, he is much less short of breath and feels back to his baseline in terms of breathing. Suspect CHF exacerbation may have been triggered by combination of lung infection and hypertensive urgency. Creatinine steady today.  - I am going to change Lasix to po, 60 mg po bid.  - Can stop NTG gtt (actually it is already off) and start Imdur 90.  2. HTN: Admitted with hypertensive urgency/emergency and pulmonary edema.  Continue Coreg and hydralazine.  Will add amlodipine 5 mg daily since we are stopping NTG gtt.  3. ID: WBCs very high at admission with elevated procalcitonin, concern for recurrent LLL PNA but no definite infiltrate on CXR.  Acute sinusitis may be the actual diagnosis.  He has not been febrile but has been on steroids.  He is being covered with antibiotics and stress dose hydrocortisone per hospitalist.  4. Renal: h/o renal transplant.  Stable creatinine.   Marca Ancona 12/11/2011 7:27 AM

## 2011-12-11 NOTE — Progress Notes (Signed)
Nitroglycerin has been discontinued. Patient is ambulatory and will transferred to 6742 per wheelchair. Report given to [place], Charity fundraiser.

## 2011-12-14 LAB — CULTURE, BLOOD (ROUTINE X 2)
Culture: NO GROWTH
Culture: NO GROWTH

## 2011-12-17 ENCOUNTER — Encounter: Payer: Self-pay | Admitting: Nurse Practitioner

## 2011-12-17 ENCOUNTER — Other Ambulatory Visit: Payer: Self-pay | Admitting: *Deleted

## 2011-12-17 DIAGNOSIS — I5022 Chronic systolic (congestive) heart failure: Secondary | ICD-10-CM

## 2011-12-17 MED ORDER — ATORVASTATIN CALCIUM 40 MG PO TABS: 40.0000 mg | ORAL_TABLET | Freq: Every day | ORAL | Status: DC

## 2011-12-17 MED ORDER — HYDRALAZINE HCL 50 MG PO TABS: 75.0000 mg | ORAL_TABLET | Freq: Three times a day (TID) | ORAL | Status: DC

## 2011-12-18 ENCOUNTER — Telehealth: Payer: Self-pay | Admitting: Cardiology

## 2011-12-18 DIAGNOSIS — I5022 Chronic systolic (congestive) heart failure: Secondary | ICD-10-CM

## 2011-12-18 MED ORDER — HYDRALAZINE HCL 25 MG PO TABS: 75.0000 mg | ORAL_TABLET | Freq: Three times a day (TID) | ORAL | Status: DC

## 2011-12-18 MED ORDER — PRAVASTATIN SODIUM 40 MG PO TABS: 40.0000 mg | ORAL_TABLET | Freq: Every evening | ORAL | Status: DC

## 2011-12-18 NOTE — Telephone Encounter (Signed)
**Note De-Identified  Obfuscation** Per Dr. [place] (DOD), Zella Ball, pharmacist at [facility], is advised that pt. may take three 25 mg (75 mg) tablets of Hydralazine TID and change Atorvastatin 40 mg to Pravastatin 40 mg at bedtime due to cost. She verbalized understanding and states she will notify pt of changes.

## 2011-12-18 NOTE — Telephone Encounter (Signed)
Pt doesn't have insurance right now and his rx's are too expensive, however the pharmacist can save him some money if he can get the hydralizine changed to 25mg  to take three times a day instead of 1 1/2  of the 50mg  still totaling 75mg  and the atorvastatin changed to pravastatin

## 2011-12-30 ENCOUNTER — Ambulatory Visit (INDEPENDENT_AMBULATORY_CARE_PROVIDER_SITE_OTHER): Payer: BC Managed Care – PPO | Admitting: Cardiology

## 2011-12-30 ENCOUNTER — Encounter: Payer: Self-pay | Admitting: Cardiology

## 2011-12-30 VITALS — BP 146/90 | HR 64 | Ht 73.0 in | Wt 253.0 lb

## 2011-12-30 DIAGNOSIS — I4892 Unspecified atrial flutter: Secondary | ICD-10-CM

## 2011-12-30 DIAGNOSIS — I10 Essential (primary) hypertension: Secondary | ICD-10-CM

## 2011-12-30 DIAGNOSIS — I509 Heart failure, unspecified: Secondary | ICD-10-CM

## 2011-12-30 DIAGNOSIS — I5022 Chronic systolic (congestive) heart failure: Secondary | ICD-10-CM

## 2011-12-30 LAB — BRAIN NATRIURETIC PEPTIDE: Pro B Natriuretic peptide (BNP): 534 pg/mL — ABNORMAL HIGH (ref 0.0–100.0)

## 2011-12-30 LAB — BASIC METABOLIC PANEL
BUN: 56 mg/dL — ABNORMAL HIGH (ref 6–23)
Creatinine, Ser: 3.2 mg/dL — ABNORMAL HIGH (ref 0.4–1.5)
GFR: 26.22 mL/min — ABNORMAL LOW (ref >60.00)

## 2011-12-30 LAB — TSH: TSH: 1.75 u[IU]/mL (ref 0.35–5.50)

## 2011-12-30 MED ORDER — AMLODIPINE BESYLATE 10 MG PO TABS: 10.0000 mg | ORAL_TABLET | Freq: Every day | ORAL | Status: DC

## 2011-12-30 NOTE — Patient Instructions (Addendum)
Increase amlodipine to 10mg  daily. You can take 2 of your 5mg  tablets daily at the same time and use your current supply.  Your physician recommends that you have  lab work today--BMET/BNP/TSH  Your physician recommends that you return for a FASTING lipid profile /liver profile. This is scheduled for Monday December 16,2013. The lab opens at 7:30am.   Your physician recommends that you schedule a follow-up appointment in: 4-5 weeks with Dr Shirlee Latch.

## 2011-12-30 NOTE — Progress Notes (Signed)
Patient ID: Chad [name], male   DOB: 1955/06/25, 56 y.o.   MRN: [id number] Nephrology: Dr. Caryn Section  56 yo with history of renal transplant, HCV, paroxysmal atrial flutter, and chronic systolic CHF presents for cardiology followup.  He has had a complicated course recently.  In 5/13, he developed parainfluenza PNA that progressed to ARDS.  He had septic shock and was briefly on CVVH.  He went into atrial flutter with RVR (converted back to NSR spontaneously).  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  He went to inpatient rehab until 6/13. In 7/13, he was re-hospitalized with a lower leg wound infection.  Proteus and Serratia grew out.  He is now followed in the wound clinic.  He was admitted again in 9/13 with acute/chronic diastolic CHF.  Repeat echo showed EF 30-35%.  Lexiscan myoview showed EF 42% with no ischemia or infarction.  He was admitted most recently in 11/13 with likely recurrent PNA (LLL infiltrate, fever, elevated procalcitonin).  In the setting of PNA, he developed acute on chronic systolic CHF.  He was diuresed with IV Lasix and was treated with antibiotics.    Patient currently is doing well.  He has chronic lower extremity edema, and both lower legs are wrapped.  No chest pain, orthopnea, or PND.  Breathing is back to baseline.  He is short of breath walking up a flight of steps but does ok on flat ground.  Weight is stable compared to prior appointment in this office (before recent admission).  He needs to reschedule his sleep study.   Labs (9/13): K 4.2 => 4.6, creatinine 3.12 => 3.2, BNP 25000, TSH normal, LDL 153, HDL 40, LFTs normal Labs (14/78): K 3.3, creatinine 3.1, BUN 55  PMH: 1. Atrial flutter in setting of septic shock/PNA in 5/13.  2. CKD: Renal transplant 1996. Now baseline creatinine is around 3.  3. HTN 4. HCV 5. OSA: not currently on CPAP.  6. Parainfluenza PNA with ARDS and septic shock in 5/13.  He required CVVH briefly.  Recurrent PNA in  11/13.  7. Lower leg wound infection 7/13 with Proteus and Serratia.  8. Chronic systolic CHF: Echo (5/13) with EF 15-20%, diffuse hypokinesis.  Repeat echo in (9/13) with EF 30-35%, diffuse hypokinesis worse inferoposteriorly, mild to moderate MR with mild MS, mildly dilated RV with mildly decreased systolic function, PASP 73 mmHg.  Lexiscan myoview (9/13) with EF 42%, diffuse hypokinesis, no evidence for ischemia or infarction.  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   9. 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   FH: No premature CAD.    ROS: All systems reviewed and negative except as per HPI.   Current Outpatient Prescriptions  Medication Sig Dispense Refill  . amiodarone (PACERONE) 200 MG tablet Take 200 mg by mouth daily.      Marland Kitchen amLODipine (NORVASC) 5 MG tablet Take 1 tablet (5 mg total) by mouth daily.  30 tablet  0  . aspirin 81 MG chewable tablet Chew 1 tablet (81 mg total) by mouth daily.      . calcitRIOL (ROCALTROL) 0.25 MCG capsule Take 0.25 mcg by mouth daily.      . carvedilol (COREG) 25 MG tablet Take 25 mg by mouth 2 (two) times daily with a meal.      . famotidine (PEPCID) 20 MG tablet Take 1 tablet (20 mg total) by mouth daily.      Marland Kitchen  furosemide (LASIX) 40 MG tablet Take 1.5 tablets (60 mg total) by mouth 2 (two) times daily.  60 tablet  0  . hydrALAZINE (APRESOLINE) 25 MG tablet Take 3 tablets (75 mg total) by mouth 3 (three) times daily between meals.  270 tablet  0  . isosorbide mononitrate (IMDUR) 30 MG 24 hr tablet Take 3 tablets (90 mg total) by mouth daily.  90 tablet  0  . levofloxacin (LEVAQUIN) 750 MG tablet Take 1 tablet (750 mg total) by mouth every other day.  1 tablet  0  . omeprazole (PRILOSEC) 20 MG capsule Take 20 mg by mouth daily.       . pantoprazole (PROTONIX) 40 MG tablet Take 40 mg by mouth 2 (two) times daily before a meal.      . potassium chloride SA (K-DUR,KLOR-CON) 20 MEQ  tablet Take 1 tablet (20 mEq total) by mouth 2 (two) times daily. Take one tablet (20 meq) daily  30 tablet  11  . pravastatin (PRAVACHOL) 40 MG tablet Take 1 tablet (40 mg total) by mouth every evening.  90 tablet  0  . predniSONE (DELTASONE) 10 MG tablet Take 10 mg by mouth daily.      . sirolimus (RAPAMUNE) 1 MG/ML solution Take 1.5 mg by mouth daily.      Marland Kitchen tiZANidine (ZANAFLEX) 2 MG tablet Take 2 mg by mouth every 6 (six) hours as needed. For pain        There were no vitals taken for this visit. General: NAD, obese Neck: No JVD, no thyromegaly or thyroid nodule.  Lungs: Clear to auscultation bilaterally with normal respiraorty effort. CV: Nondisplaced PMI.  Heart regular S1/S2, no S3/S4, no murmur.  Legs wrapped with 1+ edema to knees.  No carotid bruit.    Abdomen: Soft, nontender, no hepatosplenomegaly, no distention.  Neurologic: Alert and oriented x 3.  Psych: Normal affect. Extremities: No clubbing or cyanosis.   Assessment/Plan: 1. Chronic systolic CHF:  EF 30-35% on last echo, 42% by myoview.  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  Myoview showed no ischemia or infarction.  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  Hypertensive cardiomyopathy is also a consideration.  JVP is not elevated today. He is on a higher dose of Lasix now since his recent admission with PNA and acute on chronic systolic CHF.   - Continue current Coreg, Imdur, and hydralazine dosing.  - Continue Lasix at current dosing but will get BMET and BNP.   - Echo in 12/13 after intensive medical treatment and control of BP.  If EF is still down, will need to think about ICD.  2. Atrial flutter: Patient has maintained NSR on amiodarone.  He is not on coumadin due to history of GI bleeding.  Continue amiodarone. LFTs were normal recently, will check TSH.  He should have a yearly eye exam.  3. OSA: Has OSA but does not have a CPAP machine.   He missed his sleep study in 11/13 and will reschedule.  4. Hyperlipidemia:  LDL was high.  He has changed his statin to atorvastatin, will get lipids/LFTs in 12/13.  5. CKD: S/p transplant.  Creatinine elevated stably. Monitor closely.  6. HTN: BP still elevated but better with addition of amlodipine.  Increase amlodipine to 10 mg daily.   Followup in 1 month.   Marca Ancona 12/30/2011

## 2011-12-31 NOTE — Discharge Summary (Addendum)
Physician Discharge Summary  TARO HIDROGO AOZ:[id number] DOB: [birth year] DOA: 12/08/2011  PCP: [name], MD  Admit date: 12/08/2011 Discharge date: 12/11/11  Time spent: 45 minutes  Discharge Diagnoses:  Active Problems:  CHF (congestive heart failure)  Community acquired pneumonia  PAF (paroxysmal atrial fibrillation)  History of renal transplant  HTN (hypertension)  Respiratory failure with hypoxia   Discharge Condition: stable    Diet recommendation: heart healthy  Filed Weights   12/08/11 1809 12/10/11 0400 12/11/11 0500  Weight: 113.2 kg (249 lb 9 oz) 116.7 kg (257 lb 4.4 oz) 106 kg (233 lb 11 oz)    History of present illness:  56 year old male, with rather complex medical history, including renal transplant 1996, HCV, paroxysmal atrial flutter, dyslipidemia and chronic systolic CHF/NICM, OSA and history of GI bleed. In 05/2011 he developed parainfluenza pneumonia that progressed to ARDS, septic shock and was briefly on CVVH. 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 Following recovery, he went to inpatient rehab until 06/2011. In 07/2011, he was re-hospitalized with a lower leg wound infection secondary to Proteus and Serratia. He is now followed in the wound clinic. His most recent hospitalization was 10/02/11-10/15/11 for acute on chronic diastolic CHF. Repeat echo showed EF 30-35%. Lexiscan myoview showed EF 42% with no ischemia or infarction. According to patient, he was quite well all day on 12/07/11, went to bed at his usual tome, and awoke suddenly with shortness of breath at about 3:00 AM on 12/08/11. He denied chest pain, or fever, had a mild cough, productive of clear phlegm. He vomited twice, had loose bowel movements, but no diarrhea. In the ED, patient was found to be tachypneic, with an O2 sat of 82%-85% on RA. He was placed on 15L via    Hospital Course:  Acute respiratory failure with hypoxia  Due to acute CHF  exacerbation and Pneumonia - see below  NICM / acute on chronic systolic CHF exacerbation  Appreciate cardiology management- Lasix and Nitro infusion were used - EF 35% with PA systolic pressure 70 mmHg on echo - has diuresed well.  ?Pneumonia - LLL - healthcare associated (hospitalized in Sept 2013)  WBC count 24 on admission- along with pulmonary edema, density in LLL noted on admission.  Cont current antibiotics for short 5 day course  - clinically stabilizing  Sinus congestion Patient complained of sinus heaviness and inability to breath through his nose- xray of sinuses is w/o evidence to suggest a signif sinusitis/sinus infection - Afrin x 1 dose was effective for him- he can use PRN Afrin at home- he has been educated regarding continued use and rebound congestion  PAF (paroxysmal atrial fibrillation)  Currently NSR - on Amiodarone  History of renal transplant with acute on chronic renal failure  Baseline crt ~3.1 - On immunosuppressives - Prednisone 10 mg daily at home- given stress dose Hydrocortisone in the hospital HTN (hypertension)  Reasonably controlled at present time -  Code Status: full code    Procedures:  none  Consultations: [facility]  Discharge Exam: Filed Vitals:   12/11/11 0500 12/11/11 0731 12/11/11 0918 12/11/11 1046  BP:  149/85 144/78 166/97  Pulse:  75  71  Temp:  97.3 F (36.3 C)  98.2 F (36.8 C)  TempSrc:  Oral  Oral  Resp:  24  20  Height:      Weight: 106 kg (233 lb 11 oz)     SpO2:  97%  96%    General: No acute respiratory distress at rest  Lungs: mild bibasilar crackles - no wheeze  Cardiovascular: Regular rate and rhythm w/ 3/6 holosystolic M  Abdomen: overweight, nontender, nondistended, soft, bowel sounds positive, no rebound, no ascites, no appreciable mass  Extremities: No significant cyanosis, clubbing, or edema bilateral lower extremities - unna boots to B LE dry and clean    Discharge Instructions  Discharge Orders     Future Appointments: Provider: Department: Dept Phone: Center:   01/02/2012 9:45 AM [facility] [phone] [facility]   01/12/2012 9:00 AM [facility] Main Office [place]) [phone] [facility]   01/12/2012 9:30 AM [facility] SITE 3 ECHO LAB [phone] None   01/30/2012 8:15 AM [facility] [phone] [facility]   02/03/2012 9:00 AM Laurey Morale, MD [facility] Main Office [place]) [phone] [facility]     Future Orders Please Complete By Expires   Diet - low sodium heart healthy      Increase activity slowly      Discharge instructions      Comments:   Weigh yourself daily- take daily weights with you when you see your doctor on Wednesday.  Remind your doctor/ PA to check a "Bmet"       Medication List     As of 12/31/2011 10:54 AM    TAKE these medications         amiodarone 200 MG tablet   Commonly known as: PACERONE   Take 200 mg by mouth daily.      aspirin 81 MG chewable tablet   Chew 1 tablet (81 mg total) by mouth daily.      calcitRIOL 0.25 MCG capsule   Commonly known as: ROCALTROL   Take 0.25 mcg by mouth daily.      carvedilol 25 MG tablet   Commonly known as: COREG   Take 25 mg by mouth 2 (two) times daily with a meal.      famotidine 20 MG tablet   Commonly known as: PEPCID   Take 1 tablet (20 mg total) by mouth daily.      furosemide 40 MG tablet   Commonly known as: LASIX   Take 1.5 tablets (60 mg total) by mouth 2 (two) times daily.      isosorbide mononitrate 30 MG 24 hr tablet   Commonly known as: IMDUR   Take 3 tablets (90 mg total) by mouth daily.      levofloxacin 750 MG tablet   Commonly known as: LEVAQUIN   Take 1 tablet (750 mg total) by mouth every other day.      omeprazole 20 MG capsule   Commonly known as: PRILOSEC   Take 20 mg by mouth daily.      pantoprazole  40 MG tablet   Commonly known as: PROTONIX   Take 40 mg by mouth 2 (two) times daily before a meal.      potassium chloride SA 20 MEQ tablet   Commonly known as: K-DUR,KLOR-CON   Take 1 tablet (20 mEq total) by mouth 2 (two) times daily. Take one tablet (20 meq) daily      predniSONE 10 MG tablet   Commonly known as: DELTASONE   Take 10 mg by mouth daily.      sirolimus 1 MG/ML solution   Commonly known as: RAPAMUNE   Take 1.5 mg by mouth  daily.      tiZANidine 2 MG tablet   Commonly known as: ZANAFLEX   Take 2 mg by mouth every 6 (six) hours as needed. For pain           Follow-up Information    Follow up with Marca Ancona, MD. (Wed Nov 20th 2:30- appt with PA and Dr Shirlee Latch)    Contact information:   [address] [place] [address] [phone]           The results of significant diagnostics from this hospitalization (including imaging, microbiology, ancillary and laboratory) are listed below for reference.    Significant Diagnostic Studies: Dg Sinuses Complete  12/09/2011  *RADIOLOGY REPORT*  Clinical Data: Sinus pain  PARANASAL SINUSES - COMPLETE 3 + VIEW  Comparison: None.  Findings: Paranasal sinuses are clear.  No significant opacification or air-fluid level in the sinuses.  No acute bony changes.  Nasal bone is intact and the orbit appears normal.  IMPRESSION: Negative   Original Report Authenticated By: Janeece Riggers, M.D.    Dg Chest 2 View  12/09/2011  *RADIOLOGY REPORT*  Clinical Data: Pneumonia, CHF  CHEST - 2 VIEW  Comparison: 12/08/2011  Findings: Enlargement of cardiac silhouette with pulmonary vascular congestion. Diffuse interstitial infiltrates bilaterally appear improved, likely improving CHF. Small bibasilar effusions. No pneumothorax. Bones stable.  IMPRESSION: Improving CHF.   Original Report Authenticated By: Ulyses Southward, M.D.    Dg Chest Portable 1 View  12/08/2011  *RADIOLOGY REPORT*  Clinical Data:  Shortness of breath.  Weakness.  PORTABLE CHEST - 1 VIEW  Comparison: Chest x-ray 10/03/2011.  Findings: Lung volumes are low.  Retrocardiac opacity on the left may reflect atelectasis and/or consolidation (however, this is likely accentuated by severe underpenetration of the film). Possible small left pleural effusion. There is cephalization of the pulmonary vasculature and slight indistinctness of the interstitial markings suggestive of mild pulmonary edema.  Mild cardiomegaly is unchanged. The patient is rotated to the left on today's exam, resulting in distortion of the mediastinal contours and reduced diagnostic sensitivity and specificity for mediastinal pathology. Atherosclerosis in the thoracic aorta.  IMPRESSION: 1.  Mild cardiomegaly with mild interstitial pulmonary edema; findings suggestive of congestive heart failure. 2.  Under penetrated film with poor visualization of the left lower lobe.  Underlying atelectasis and/or consolidation is not excluded in the left lower lobe, however, these findings may alternatively be technique related.  Additionally, the possibility of a small left pleural effusion is not excluded.   Original Report Authenticated By: Trudie Reed, M.D.     Microbiology: No results found for this or any previous visit (from the past 240 hour(s)).   Labs: Basic Metabolic Panel:  Lab 12/30/11 8657  NA 136  K 4.1  CL 103  CO2 25  GLUCOSE 95  BUN 56*  CREATININE 3.2*  CALCIUM 8.7  MG --  PHOS --   Liver Function Tests: No results found for this basename: AST:5,ALT:5,ALKPHOS:5,BILITOT:5,PROT:5,ALBUMIN:5 in the last 168 hours No results found for this basename: LIPASE:5,AMYLASE:5 in the last 168 hours No results found for this basename: AMMONIA:5 in the last 168 hours CBC: No results found for this basename: WBC:5,NEUTROABS:5,HGB:5,HCT:5,MCV:5,PLT:5 in the last 168 hours Cardiac Enzymes: No results found for this basename:  CKTOTAL:5,CKMB:5,CKMBINDEX:5,TROPONINI:5 in the last 168 hours BNP: BNP (last 3 results)  Basename 12/30/11 1057 12/09/11 0500 12/03/11 0835  PROBNP 534.0* 35266.0* 1019.0*   CBG: No results found for this basename: GLUCAP:5 in  the last 168 hours     Signed:  Acelynn Dejonge  [name] 12/31/2011, 10:54 AM

## 2012-01-01 ENCOUNTER — Other Ambulatory Visit (HOSPITAL_COMMUNITY): Payer: Self-pay

## 2012-01-02 ENCOUNTER — Encounter (HOSPITAL_BASED_OUTPATIENT_CLINIC_OR_DEPARTMENT_OTHER): Payer: BC Managed Care – PPO | Attending: General Surgery

## 2012-01-02 ENCOUNTER — Other Ambulatory Visit (HOSPITAL_COMMUNITY): Payer: Self-pay | Admitting: Vascular Surgery

## 2012-01-02 ENCOUNTER — Ambulatory Visit (HOSPITAL_COMMUNITY)
Admission: RE | Admit: 2012-01-02 | Discharge: 2012-01-02 | Disposition: A | Discharge status: Home / Self Care | Payer: Self-pay | Source: Ambulatory Visit | Attending: General Surgery | Admitting: General Surgery

## 2012-01-02 DIAGNOSIS — I739 Peripheral vascular disease, unspecified: Secondary | ICD-10-CM

## 2012-01-02 DIAGNOSIS — L97929 Non-pressure chronic ulcer of unspecified part of left lower leg with unspecified severity: Secondary | ICD-10-CM

## 2012-01-02 DIAGNOSIS — M7989 Other specified soft tissue disorders: Secondary | ICD-10-CM

## 2012-01-02 DIAGNOSIS — M79606 Pain in leg, unspecified: Secondary | ICD-10-CM

## 2012-01-02 DIAGNOSIS — L97909 Non-pressure chronic ulcer of unspecified part of unspecified lower leg with unspecified severity: Secondary | ICD-10-CM | POA: Insufficient documentation

## 2012-01-02 DIAGNOSIS — I87319 Chronic venous hypertension (idiopathic) with ulcer of unspecified lower extremity: Secondary | ICD-10-CM | POA: Insufficient documentation

## 2012-01-02 DIAGNOSIS — M79609 Pain in unspecified limb: Secondary | ICD-10-CM

## 2012-01-02 NOTE — Progress Notes (Addendum)
*  PRELIMINARY RESULTS* Vascular Ultrasound Bilateral Lower Extremity Arterial Duplex has been completed.  There is no obvious evidence of hemodynamically significant stenosis. All lower extremity arterial waveforms are triphasic, with the exception of the left peroneal artery which is biphasic. Unable to visualize distal posterior tibial and peroneal arteries due to bandages. There is evidence of a right Baker's cyst. Attempted to call preliminary results in to Dr.Parker's office, however there was no answer.   01/02/2012 12:57 PM Gertie Fey, RDMS, RDCS

## 2012-01-02 NOTE — Progress Notes (Addendum)
*  Preliminary Results* Bilateral lower extremity venous duplex completed. Bilateral lower extremities are negative for deep vein thrombosis. Unable to visualize bilateral distal posterior tibial and peroneal veins due to bandages, therefore deep vein thrombosis cannot be excluded in these segments. Attempted to call preliminary results in to Dr.Parker's office, however there was no answer.  01/02/2012 1:01 PM Gertie Fey, RDMS, RDCS

## 2012-01-12 ENCOUNTER — Other Ambulatory Visit (HOSPITAL_COMMUNITY): Payer: Self-pay

## 2012-01-12 ENCOUNTER — Encounter: Payer: Self-pay | Admitting: *Deleted

## 2012-01-12 NOTE — Progress Notes (Signed)
Patient ID: Chad [name], male   DOB: [birth year], 56 y.o.   MRN: [id number]

## 2012-01-28 DIAGNOSIS — J189 Pneumonia, unspecified organism: Secondary | ICD-10-CM

## 2012-01-28 HISTORY — PX: TRACHEOSTOMY: SUR1362

## 2012-01-28 HISTORY — DX: Pneumonia, unspecified organism: J18.9

## 2012-01-28 HISTORY — PX: TRACHEOSTOMY CLOSURE: SHX458

## 2012-01-30 ENCOUNTER — Encounter (HOSPITAL_BASED_OUTPATIENT_CLINIC_OR_DEPARTMENT_OTHER): Payer: BC Managed Care – PPO | Attending: General Surgery

## 2012-01-30 ENCOUNTER — Encounter (HOSPITAL_BASED_OUTPATIENT_CLINIC_OR_DEPARTMENT_OTHER): Payer: BC Managed Care – PPO

## 2012-01-30 DIAGNOSIS — I87309 Chronic venous hypertension (idiopathic) without complications of unspecified lower extremity: Secondary | ICD-10-CM | POA: Insufficient documentation

## 2012-01-30 DIAGNOSIS — I890 Lymphedema, not elsewhere classified: Secondary | ICD-10-CM | POA: Insufficient documentation

## 2012-01-30 DIAGNOSIS — I872 Venous insufficiency (chronic) (peripheral): Secondary | ICD-10-CM | POA: Insufficient documentation

## 2012-01-30 DIAGNOSIS — L97809 Non-pressure chronic ulcer of other part of unspecified lower leg with unspecified severity: Secondary | ICD-10-CM | POA: Insufficient documentation

## 2012-02-03 ENCOUNTER — Ambulatory Visit: Payer: Self-pay | Admitting: Cardiology

## 2012-03-01 ENCOUNTER — Telehealth: Payer: Self-pay | Admitting: *Deleted

## 2012-03-01 NOTE — Telephone Encounter (Signed)
   [name] - FW: 2D Echo ok to reschedule??     FW: 2D Echo ok to reschedule??      [name]     Sent: [name] February 27, 2012  4:42 PM    To: Britt Bolognese; Laurey Morale, MD      DWON SKY    MRN: [id number] DOB: January 18, 1956    Pt Work: [phone] Pt Home: [phone]           Message     Spoke with Mr. Busker- his insurance still has not been reinstated at this time.

## 2012-03-05 ENCOUNTER — Encounter (HOSPITAL_BASED_OUTPATIENT_CLINIC_OR_DEPARTMENT_OTHER): Payer: Self-pay | Attending: General Surgery

## 2012-03-05 DIAGNOSIS — I87319 Chronic venous hypertension (idiopathic) with ulcer of unspecified lower extremity: Secondary | ICD-10-CM | POA: Insufficient documentation

## 2012-03-05 DIAGNOSIS — L97909 Non-pressure chronic ulcer of unspecified part of unspecified lower leg with unspecified severity: Secondary | ICD-10-CM | POA: Insufficient documentation

## 2012-03-12 ENCOUNTER — Inpatient Hospital Stay (HOSPITAL_COMMUNITY)
Admission: EM | Admit: 2012-03-12 | Discharge: 2012-03-16 | DRG: 292 | Disposition: A | Discharge status: Home / Self Care | Payer: MEDICAID | Attending: Internal Medicine | Admitting: Internal Medicine

## 2012-03-12 ENCOUNTER — Encounter (HOSPITAL_COMMUNITY): Payer: Self-pay | Admitting: Emergency Medicine

## 2012-03-12 ENCOUNTER — Emergency Department (HOSPITAL_COMMUNITY): Payer: Self-pay

## 2012-03-12 DIAGNOSIS — B1920 Unspecified viral hepatitis C without hepatic coma: Secondary | ICD-10-CM | POA: Diagnosis present

## 2012-03-12 DIAGNOSIS — I5023 Acute on chronic systolic (congestive) heart failure: Principal | ICD-10-CM

## 2012-03-12 DIAGNOSIS — Z940 Kidney transplant status: Secondary | ICD-10-CM

## 2012-03-12 DIAGNOSIS — I10 Essential (primary) hypertension: Secondary | ICD-10-CM

## 2012-03-12 DIAGNOSIS — Z7952 Long term (current) use of systemic steroids: Secondary | ICD-10-CM

## 2012-03-12 DIAGNOSIS — I272 Pulmonary hypertension, unspecified: Secondary | ICD-10-CM

## 2012-03-12 DIAGNOSIS — Z7982 Long term (current) use of aspirin: Secondary | ICD-10-CM

## 2012-03-12 DIAGNOSIS — Z79899 Other long term (current) drug therapy: Secondary | ICD-10-CM

## 2012-03-12 DIAGNOSIS — J9691 Respiratory failure, unspecified with hypoxia: Secondary | ICD-10-CM

## 2012-03-12 DIAGNOSIS — A419 Sepsis, unspecified organism: Secondary | ICD-10-CM

## 2012-03-12 DIAGNOSIS — I428 Other cardiomyopathies: Secondary | ICD-10-CM | POA: Diagnosis present

## 2012-03-12 DIAGNOSIS — R509 Fever, unspecified: Secondary | ICD-10-CM

## 2012-03-12 DIAGNOSIS — K219 Gastro-esophageal reflux disease without esophagitis: Secondary | ICD-10-CM | POA: Diagnosis present

## 2012-03-12 DIAGNOSIS — G7281 Critical illness myopathy: Secondary | ICD-10-CM

## 2012-03-12 DIAGNOSIS — E785 Hyperlipidemia, unspecified: Secondary | ICD-10-CM | POA: Diagnosis present

## 2012-03-12 DIAGNOSIS — J80 Acute respiratory distress syndrome: Secondary | ICD-10-CM

## 2012-03-12 DIAGNOSIS — I5022 Chronic systolic (congestive) heart failure: Secondary | ICD-10-CM

## 2012-03-12 DIAGNOSIS — I429 Cardiomyopathy, unspecified: Secondary | ICD-10-CM

## 2012-03-12 DIAGNOSIS — I4892 Unspecified atrial flutter: Secondary | ICD-10-CM

## 2012-03-12 DIAGNOSIS — T8610 Unspecified complication of kidney transplant: Secondary | ICD-10-CM

## 2012-03-12 DIAGNOSIS — R112 Nausea with vomiting, unspecified: Secondary | ICD-10-CM

## 2012-03-12 DIAGNOSIS — N2581 Secondary hyperparathyroidism of renal origin: Secondary | ICD-10-CM | POA: Diagnosis present

## 2012-03-12 DIAGNOSIS — I509 Heart failure, unspecified: Secondary | ICD-10-CM

## 2012-03-12 DIAGNOSIS — R05 Cough: Secondary | ICD-10-CM

## 2012-03-12 DIAGNOSIS — I5021 Acute systolic (congestive) heart failure: Secondary | ICD-10-CM

## 2012-03-12 DIAGNOSIS — L97929 Non-pressure chronic ulcer of unspecified part of left lower leg with unspecified severity: Secondary | ICD-10-CM

## 2012-03-12 DIAGNOSIS — R197 Diarrhea, unspecified: Secondary | ICD-10-CM | POA: Diagnosis present

## 2012-03-12 DIAGNOSIS — J189 Pneumonia, unspecified organism: Secondary | ICD-10-CM

## 2012-03-12 DIAGNOSIS — IMO0002 Reserved for concepts with insufficient information to code with codable children: Secondary | ICD-10-CM

## 2012-03-12 DIAGNOSIS — L03116 Cellulitis of left lower limb: Secondary | ICD-10-CM

## 2012-03-12 DIAGNOSIS — G4733 Obstructive sleep apnea (adult) (pediatric): Secondary | ICD-10-CM

## 2012-03-12 DIAGNOSIS — J9600 Acute respiratory failure, unspecified whether with hypoxia or hypercapnia: Secondary | ICD-10-CM

## 2012-03-12 DIAGNOSIS — I480 Paroxysmal atrial fibrillation: Secondary | ICD-10-CM

## 2012-03-12 DIAGNOSIS — R6521 Severe sepsis with septic shock: Secondary | ICD-10-CM

## 2012-03-12 DIAGNOSIS — N179 Acute kidney failure, unspecified: Secondary | ICD-10-CM

## 2012-03-12 DIAGNOSIS — H1032 Unspecified acute conjunctivitis, left eye: Secondary | ICD-10-CM

## 2012-03-12 DIAGNOSIS — N189 Chronic kidney disease, unspecified: Secondary | ICD-10-CM

## 2012-03-12 DIAGNOSIS — I129 Hypertensive chronic kidney disease with stage 1 through stage 4 chronic kidney disease, or unspecified chronic kidney disease: Secondary | ICD-10-CM | POA: Diagnosis present

## 2012-03-12 DIAGNOSIS — Z9229 Personal history of other drug therapy: Secondary | ICD-10-CM

## 2012-03-12 DIAGNOSIS — I420 Dilated cardiomyopathy: Secondary | ICD-10-CM | POA: Diagnosis present

## 2012-03-12 DIAGNOSIS — D696 Thrombocytopenia, unspecified: Secondary | ICD-10-CM

## 2012-03-12 DIAGNOSIS — N184 Chronic kidney disease, stage 4 (severe): Secondary | ICD-10-CM | POA: Diagnosis present

## 2012-03-12 LAB — URINE MICROSCOPIC-ADD ON

## 2012-03-12 LAB — CBC WITH DIFFERENTIAL/PLATELET
Basophils Absolute: 0 10*3/uL (ref 0.0–0.1)
Basophils Relative: 0 % (ref 0–1)
Lymphocytes Relative: 5 % — ABNORMAL LOW (ref 12–46)
MCHC: 31.9 g/dL (ref 30.0–36.0)
Monocytes Absolute: 1.6 10*3/uL — ABNORMAL HIGH (ref 0.1–1.0)
Neutro Abs: 12.8 10*3/uL — ABNORMAL HIGH (ref 1.7–7.7)
Neutrophils Relative %: 83 % — ABNORMAL HIGH (ref 43–77)
Platelets: 129 10*3/uL — ABNORMAL LOW (ref 150–400)
RDW: 18.6 % — ABNORMAL HIGH (ref 11.5–15.5)
WBC: 15.5 10*3/uL — ABNORMAL HIGH (ref 4.0–10.5)

## 2012-03-12 LAB — POCT I-STAT 3, ART BLOOD GAS (G3+)
Acid-base deficit: 4 mmol/L — ABNORMAL HIGH (ref 0.0–2.0)
Bicarbonate: 22.6 mEq/L (ref 20.0–24.0)
O2 Saturation: 90 %
TCO2: 24 mmol/L (ref 0–100)
pO2, Arterial: 73 mmHg — ABNORMAL LOW (ref 80.0–100.0)

## 2012-03-12 LAB — URINALYSIS, ROUTINE W REFLEX MICROSCOPIC
Bilirubin Urine: NEGATIVE
Glucose, UA: NEGATIVE mg/dL
Hgb urine dipstick: NEGATIVE
Protein, ur: >300 mg/dL — AB

## 2012-03-12 LAB — POCT I-STAT TROPONIN I: Troponin i, poc: 0.03 ng/mL (ref 0.00–0.08)

## 2012-03-12 LAB — CBC
MCH: 27.7 pg (ref 26.0–34.0)
MCV: 89 fL (ref 78.0–100.0)
Platelets: 125 10*3/uL — ABNORMAL LOW (ref 150–400)
RBC: 3.83 MIL/uL — ABNORMAL LOW (ref 4.22–5.81)
RDW: 18.5 % — ABNORMAL HIGH (ref 11.5–15.5)

## 2012-03-12 LAB — COMPREHENSIVE METABOLIC PANEL
ALT: 17 U/L (ref 0–53)
AST: 28 U/L (ref 0–37)
Albumin: 3.3 g/dL — ABNORMAL LOW (ref 3.5–5.2)
Chloride: 96 mEq/L (ref 96–112)
Creatinine, Ser: 3.51 mg/dL — ABNORMAL HIGH (ref 0.50–1.35)
Potassium: 4.3 mEq/L (ref 3.5–5.1)
Sodium: 132 mEq/L — ABNORMAL LOW (ref 135–145)
Total Bilirubin: 0.4 mg/dL (ref 0.3–1.2)

## 2012-03-12 LAB — CG4 I-STAT (LACTIC ACID): Lactic Acid, Venous: 1.22 mmol/L (ref 0.5–2.2)

## 2012-03-12 LAB — PRO B NATRIURETIC PEPTIDE: Pro B Natriuretic peptide (BNP): 14215 pg/mL — ABNORMAL HIGH (ref 0–125)

## 2012-03-12 LAB — INFLUENZA PANEL BY PCR (TYPE A & B)
H1N1 flu by pcr: NOT DETECTED
Influenza B By PCR: NEGATIVE

## 2012-03-12 MED ORDER — SODIUM CHLORIDE 0.9 % IJ SOLN
3.0000 mL | Freq: Two times a day (BID) | INTRAMUSCULAR | Status: DC
  Administered 2012-03-12 – 2012-03-16 (×9): 3 mL via INTRAVENOUS

## 2012-03-12 MED ORDER — FUROSEMIDE 10 MG/ML IJ SOLN
60.0000 mg | Freq: Two times a day (BID) | INTRAMUSCULAR | Status: AC
  Administered 2012-03-12 – 2012-03-13 (×2): 60 mg via INTRAVENOUS
  Filled 2012-03-12: qty 6

## 2012-03-12 MED ORDER — SODIUM CHLORIDE 0.9 % IJ SOLN
3.0000 mL | INTRAMUSCULAR | Status: DC | PRN
  Administered 2012-03-12 (×3): 3 mL via INTRAVENOUS

## 2012-03-12 MED ORDER — ACETAMINOPHEN 325 MG PO TABS
650.0000 mg | ORAL_TABLET | Freq: Once | ORAL | Status: AC
  Administered 2012-03-12: 650 mg via ORAL
  Filled 2012-03-12: qty 2

## 2012-03-12 MED ORDER — ACETAMINOPHEN 325 MG PO TABS: 650.0000 mg | ORAL_TABLET | Freq: Four times a day (QID) | ORAL | Status: DC | PRN

## 2012-03-12 MED ORDER — SODIUM CHLORIDE 0.9 % IV SOLN: 250.0000 mL | INTRAVENOUS | Status: DC | PRN

## 2012-03-12 MED ORDER — ASPIRIN EC 81 MG PO TBEC
81.0000 mg | DELAYED_RELEASE_TABLET | Freq: Every day | ORAL | Status: DC
  Administered 2012-03-12 – 2012-03-16 (×5): 81 mg via ORAL
  Filled 2012-03-12 (×5): qty 1

## 2012-03-12 MED ORDER — METRONIDAZOLE 500 MG PO TABS
500.0000 mg | ORAL_TABLET | Freq: Three times a day (TID) | ORAL | Status: DC
  Administered 2012-03-12 – 2012-03-14 (×6): 500 mg via ORAL
  Filled 2012-03-12 (×9): qty 1

## 2012-03-12 MED ORDER — ACETAMINOPHEN 650 MG RE SUPP: 650.0000 mg | Freq: Four times a day (QID) | RECTAL | Status: DC | PRN

## 2012-03-12 MED ORDER — FUROSEMIDE 10 MG/ML IJ SOLN
40.0000 mg | Freq: Two times a day (BID) | INTRAMUSCULAR | Status: DC
Start: 2012-03-12 — End: 2012-03-12

## 2012-03-12 MED ORDER — ONDANSETRON HCL 4 MG PO TABS: 4.0000 mg | ORAL_TABLET | Freq: Four times a day (QID) | ORAL | Status: DC | PRN

## 2012-03-12 MED ORDER — PREDNISONE 10 MG PO TABS
10.0000 mg | ORAL_TABLET | Freq: Every day | ORAL | Status: DC
  Administered 2012-03-12 – 2012-03-16 (×5): 10 mg via ORAL
  Filled 2012-03-12 (×6): qty 1

## 2012-03-12 MED ORDER — ONDANSETRON HCL 4 MG/2ML IJ SOLN
4.0000 mg | Freq: Once | INTRAMUSCULAR | Status: AC
  Administered 2012-03-12: 4 mg via INTRAVENOUS
  Filled 2012-03-12: qty 2

## 2012-03-12 MED ORDER — GUAIFENESIN-DM 100-10 MG/5ML PO SYRP
5.0000 mL | ORAL_SOLUTION | ORAL | Status: DC | PRN
  Administered 2012-03-13 – 2012-03-15 (×5): 5 mL via ORAL
  Filled 2012-03-12 (×5): qty 5

## 2012-03-12 MED ORDER — MORPHINE SULFATE 4 MG/ML IJ SOLN
4.0000 mg | Freq: Once | INTRAMUSCULAR | Status: AC
  Administered 2012-03-12: 4 mg via INTRAVENOUS
  Filled 2012-03-12: qty 1

## 2012-03-12 MED ORDER — HEPARIN SODIUM (PORCINE) 5000 UNIT/ML IJ SOLN
5000.0000 [IU] | Freq: Three times a day (TID) | INTRAMUSCULAR | Status: DC
  Administered 2012-03-12 – 2012-03-16 (×12): 5000 [IU] via SUBCUTANEOUS
  Filled 2012-03-12 (×16): qty 1

## 2012-03-12 MED ORDER — SIROLIMUS 1 MG PO TABS
2.0000 mg | ORAL_TABLET | Freq: Every day | ORAL | Status: DC
  Administered 2012-03-12 – 2012-03-14 (×3): 2 mg via ORAL
  Filled 2012-03-12 (×3): qty 2

## 2012-03-12 MED ORDER — AMIODARONE HCL 200 MG PO TABS
200.0000 mg | ORAL_TABLET | Freq: Every day | ORAL | Status: DC
  Administered 2012-03-12 – 2012-03-16 (×5): 200 mg via ORAL
  Filled 2012-03-12 (×5): qty 1

## 2012-03-12 MED ORDER — FUROSEMIDE 10 MG/ML IJ SOLN
80.0000 mg | Freq: Once | INTRAMUSCULAR | Status: DC
  Filled 2012-03-12: qty 8

## 2012-03-12 MED ORDER — OSELTAMIVIR PHOSPHATE 75 MG PO CAPS
75.0000 mg | ORAL_CAPSULE | Freq: Two times a day (BID) | ORAL | Status: DC
  Filled 2012-03-12 (×2): qty 1

## 2012-03-12 MED ORDER — ALUM & MAG HYDROXIDE-SIMETH 200-200-20 MG/5ML PO SUSP: 30.0000 mL | Freq: Four times a day (QID) | ORAL | Status: DC | PRN

## 2012-03-12 MED ORDER — ONDANSETRON HCL 4 MG/2ML IJ SOLN: 4.0000 mg | Freq: Four times a day (QID) | INTRAMUSCULAR | Status: DC | PRN

## 2012-03-12 MED ORDER — HYDROCODONE-ACETAMINOPHEN 5-325 MG PO TABS
1.0000 | ORAL_TABLET | ORAL | Status: DC | PRN
  Administered 2012-03-12 – 2012-03-13 (×3): 1 via ORAL
  Administered 2012-03-13: 2 via ORAL
  Administered 2012-03-13 – 2012-03-14 (×2): 1 via ORAL
  Administered 2012-03-14 – 2012-03-16 (×3): 2 via ORAL
  Filled 2012-03-12: qty 1
  Filled 2012-03-12: qty 2
  Filled 2012-03-12: qty 1
  Filled 2012-03-12 (×3): qty 2
  Filled 2012-03-12 (×3): qty 1

## 2012-03-12 MED ORDER — SIMVASTATIN 10 MG PO TABS
10.0000 mg | ORAL_TABLET | Freq: Every day | ORAL | Status: DC
  Administered 2012-03-12 – 2012-03-15 (×4): 10 mg via ORAL
  Filled 2012-03-12 (×5): qty 1

## 2012-03-12 MED ORDER — CARVEDILOL 25 MG PO TABS
25.0000 mg | ORAL_TABLET | Freq: Two times a day (BID) | ORAL | Status: DC
  Administered 2012-03-12 – 2012-03-16 (×8): 25 mg via ORAL
  Filled 2012-03-12 (×10): qty 1

## 2012-03-12 MED ORDER — POTASSIUM CHLORIDE CRYS ER 10 MEQ PO TBCR
10.0000 meq | EXTENDED_RELEASE_TABLET | Freq: Every day | ORAL | Status: DC
  Administered 2012-03-12 – 2012-03-16 (×5): 10 meq via ORAL
  Filled 2012-03-12 (×5): qty 1

## 2012-03-12 MED ORDER — HYDRALAZINE HCL 50 MG PO TABS
75.0000 mg | ORAL_TABLET | Freq: Three times a day (TID) | ORAL | Status: DC
  Administered 2012-03-12 – 2012-03-16 (×12): 75 mg via ORAL
  Filled 2012-03-12 (×17): qty 1

## 2012-03-12 MED ORDER — DEXTROSE 5 % IV SOLN
2.0000 g | INTRAVENOUS | Status: DC
  Administered 2012-03-12 – 2012-03-13 (×2): 2 g via INTRAVENOUS
  Filled 2012-03-12 (×3): qty 2

## 2012-03-12 MED ORDER — PANTOPRAZOLE SODIUM 40 MG PO TBEC
40.0000 mg | DELAYED_RELEASE_TABLET | Freq: Every day | ORAL | Status: DC
  Administered 2012-03-12 – 2012-03-16 (×5): 40 mg via ORAL
  Filled 2012-03-12 (×3): qty 1

## 2012-03-12 NOTE — ED Notes (Signed)
MD at bedside. 

## 2012-03-12 NOTE — ED Provider Notes (Addendum)
History     CSN: [id number]  Arrival date & time 03/12/12  0434   First MD Initiated Contact with Patient 03/12/12 0439      No chief complaint on file.   (Consider location/radiation/quality/duration/timing/severity/associated sxs/prior treatment) Patient is a 57 y.o. male presenting with URI and abdominal pain. The history is provided by the patient.  URI Presenting symptoms: congestion, cough, fatigue, rhinorrhea and sore throat   Presenting symptoms: no fever   Cough:    Cough characteristics:  Non-productive   Severity:  Moderate   Onset quality:  Gradual   Timing:  Intermittent   Progression:  Worsening Severity:  Severe Onset quality:  Gradual Duration:  1 week Timing:  Constant Progression:  Worsening Chronicity:  New Relieved by:  Nothing Associated symptoms: sinus pain and wheezing   Associated symptoms: no neck pain   Risk factors: chronic cardiac disease, chronic kidney disease, immunosuppression and recent illness   Risk factors: no sick contacts   Abdominal Pain Pain location:  Suprapubic Pain quality: gnawing and sharp   Pain radiates to:  Does not radiate Pain severity:  Moderate Onset quality:  Sudden Duration:  3 hours Timing:  Constant Progression:  Worsening Context: awakening from sleep and recent illness   Context: not alcohol use, not medication withdrawal and not sick contacts   Relieved by:  Nothing Worsened by:  Nothing tried Associated symptoms: cough, diarrhea, fatigue, sore throat and vomiting   Associated symptoms: no chest pain, no dysuria, no fever and no hematuria   Associated symptoms comment:  States abd pain started tonight and having vomiting and diarrhea   Past Medical History  Diagnosis Date  . Hypertension   . Hepatitis C 1996  . CKD (chronic kidney disease), stage IV     a. s/p renal transplant 1996.  Marland Kitchen Pneumonia   . HLD (hyperlipidemia)   . Nonischemic cardiomyopathy     a. unknown etiology, EF 30-35% by echo  09/2011;  b. 09/2011 Lexi MV EF 42%, no ischemia/infarct.  . Systolic CHF   . Atrial flutter     a. in the setting of sepsis 05/2011; on amio; no anticoagulation    Past Surgical History  Procedure Laterality Date  . Nephrectomy transplanted organ    . Insertion of dialysis catheter  06/20/2011    Procedure: INSERTION OF DIALYSIS CATHETER;  Surgeon: [name], MD;  Location: [facility] OR;  Service: Vascular;  Laterality: Right;  Ultrasound guided insertion of right internal jugular dialysis catheter  . Multiple extractions with alveoloplasty  06/27/2011    Procedure: MULTIPLE EXTRACION WITH ALVEOLOPLASTY;  Surgeon: [name], DDS;  Location: [facility] OR;  Service: Oral Surgery;  Laterality: N/A;  Extraction  of tooth # 14 with alveoloplasty  . Hip surgery    . Joint replacement      left hip    Family History  Problem Relation Age of Onset  . Heart disease Neg Hx     History  Substance Use Topics  . Smoking status: Never Smoker   . Smokeless tobacco: Never Used  . Alcohol Use: No      Review of Systems  Constitutional: Positive for fatigue. Negative for fever.  HENT: Positive for congestion, sore throat and rhinorrhea. Negative for neck pain.   Respiratory: Positive for cough and wheezing.   Cardiovascular: Positive for leg swelling. Negative for chest pain.       Chronic leg swelling  Gastrointestinal: Positive for vomiting, abdominal pain and diarrhea.  Genitourinary: Negative  for dysuria and hematuria.  All other systems reviewed and are negative.    Allergies  Review of patient's allergies indicates no known allergies.  Home Medications   Current Outpatient Rx  Name  Route  Sig  Dispense  Refill  . amiodarone (PACERONE) 200 MG tablet   Oral   Take 200 mg by mouth daily.         Marland Kitchen amLODipine (NORVASC) 10 MG tablet   Oral   Take 1 tablet (10 mg total) by mouth daily.   30 tablet   6   . aspirin 81 MG chewable tablet   Oral   Chew 1 tablet (81 mg total)  by mouth daily.         Marland Kitchen atorvastatin (LIPITOR) 40 MG tablet   Oral   Take 1 tablet (40 mg total) by mouth daily.         . calcitRIOL (ROCALTROL) 0.25 MCG capsule   Oral   Take 0.25 mcg by mouth daily.         . carvedilol (COREG) 25 MG tablet   Oral   Take 25 mg by mouth 2 (two) times daily with a meal.         . famotidine (PEPCID) 20 MG tablet   Oral   Take 1 tablet (20 mg total) by mouth daily.         . furosemide (LASIX) 40 MG tablet   Oral   Take 1.5 tablets (60 mg total) by mouth 2 (two) times daily.   60 tablet   0     Check daily weights starting today when you get ho ...   . hydrALAZINE (APRESOLINE) 25 MG tablet   Oral   Take 3 tablets (75 mg total) by mouth 3 (three) times daily between meals.   270 tablet   0   . isosorbide mononitrate (IMDUR) 30 MG 24 hr tablet   Oral   Take 3 tablets (90 mg total) by mouth daily.   90 tablet   0   . levofloxacin (LEVAQUIN) 750 MG tablet   Oral   Take 1 tablet (750 mg total) by mouth every other day.   1 tablet   0   . omeprazole (PRILOSEC) 20 MG capsule   Oral   Take 20 mg by mouth daily.          . pantoprazole (PROTONIX) 40 MG tablet   Oral   Take 40 mg by mouth 2 (two) times daily before a meal.         . potassium chloride SA (K-DUR,KLOR-CON) 20 MEQ tablet   Oral   Take 1 tablet (20 mEq total) by mouth 2 (two) times daily. Take one tablet (20 meq) daily   30 tablet   11   . predniSONE (DELTASONE) 10 MG tablet   Oral   Take 10 mg by mouth daily.         . sirolimus (RAPAMUNE) 1 MG/ML solution   Oral   Take 1.5 mg by mouth daily.         Marland Kitchen tiZANidine (ZANAFLEX) 2 MG tablet   Oral   Take 2 mg by mouth every 6 (six) hours as needed. For pain           There were no vitals taken for this visit.  Physical Exam  Nursing note and vitals reviewed. Constitutional: He is oriented to person, place, and time. He appears well-developed and well-nourished. He appears distressed.   HENT:  Head: Normocephalic and atraumatic.  Mouth/Throat: Oropharynx is clear and moist.  Eyes: Conjunctivae and EOM are normal. Pupils are equal, round, and reactive to light.  Neck: Normal range of motion. Neck supple.  Cardiovascular: Normal rate, regular rhythm and intact distal pulses.   No murmur heard. Pulmonary/Chest: No accessory muscle usage. Tachypnea noted. No respiratory distress. He has wheezes. He has rhonchi. He has no rales.  Abdominal: Soft. Normal appearance. He exhibits no distension. There is tenderness in the suprapubic area. There is no rebound, no guarding and no CVA tenderness.  No tenderness over transplanted kidney  Musculoskeletal: Normal range of motion. He exhibits edema. He exhibits no tenderness.  2+ edema in bilateral lower ext  Neurological: He is alert and oriented to person, place, and time. He has normal strength. No sensory deficit.  Skin: Skin is warm and dry. No rash noted. No erythema.  Psychiatric: He has a normal mood and affect. His behavior is normal.    ED Course  Procedures (including critical care time)  Labs Reviewed  CBC WITH DIFFERENTIAL - Abnormal; Notable for the following:    WBC 15.5 (*)    RBC 4.14 (*)    Hemoglobin 11.7 (*)    HCT 36.7 (*)    RDW 18.6 (*)    Platelets 129 (*)    Neutrophils Relative 83 (*)    Neutro Abs 12.8 (*)    Lymphocytes Relative 5 (*)    Monocytes Absolute 1.6 (*)    All other components within normal limits  COMPREHENSIVE METABOLIC PANEL - Abnormal; Notable for the following:    Sodium 132 (*)    Glucose, Bld 137 (*)    BUN 76 (*)    Creatinine, Ser 3.51 (*)    Albumin 3.3 (*)    GFR calc non Af Amer 18 (*)    GFR calc Af Amer 21 (*)    All other components within normal limits  URINALYSIS, ROUTINE W REFLEX MICROSCOPIC - Abnormal; Notable for the following:    Protein, ur >300 (*)    All other components within normal limits  PRO B NATRIURETIC PEPTIDE - Abnormal; Notable for the following:     Pro B Natriuretic peptide (BNP) 14215.0 (*)    All other components within normal limits  URINE MICROSCOPIC-ADD ON - Abnormal; Notable for the following:    Casts HYALINE CASTS (*)    All other components within normal limits  CULTURE, BLOOD (ROUTINE X 2)  CULTURE, BLOOD (ROUTINE X 2)  CG4 I-STAT (LACTIC ACID)  POCT I-STAT TROPONIN I   Dg Chest 2 View  03/12/2012  *RADIOLOGY REPORT*  Clinical Data: Wheezing, cough and shortness of breath.  CHEST - 2 VIEW  Comparison: Chest radiograph performed 12/09/2011  Findings: The lungs are mildly hypoexpanded.  Vascular congestion and vascular crowding are seen.  Mildly increased interstitial markings likely reflect mild interstitial edema, less prominent than on the prior study.  No pleural effusion or pneumothorax is seen.  The heart is mildly enlarged.  No acute osseous abnormalities are seen.  IMPRESSION: Lungs mildly hypoexpanded.  Vascular congestion and mild cardiomegaly, with mildly increased interstitial markings, likely reflecting mild interstitial edema.  This is less prominent than on the prior study.   Original Report Authenticated By: Tonia Ghent, M.D.      Date: 03/12/2012  Rate: 89  Rhythm: normal sinus rhythm  QRS Axis: normal  Intervals: normal  ST/T Wave abnormalities: nonspecific ST changes  Conduction Disutrbances:nonspecific intraventricular conduction delay  Narrative  Interpretation:   Old EKG Reviewed: unchanged   CRITICAL CARE Performed by: Gwyneth Sprout   Total critical care time: 30  Critical care time was exclusive of separately billable procedures and treating other patients.  Critical care was necessary to treat or prevent imminent or life-threatening deterioration.  Critical care was time spent personally by me on the following activities: development of treatment plan with patient and/or surrogate as well as nursing, discussions with consultants, evaluation of patient's response to treatment,  examination of patient, obtaining history from patient or surrogate, ordering and performing treatments and interventions, ordering and review of laboratory studies, ordering and review of radiographic studies, pulse oximetry and re-evaluation of patient's condition.     1. CHF (congestive heart failure)   2. Fever of unknown origin       MDM    Patient presenting do to worsening shortness of breath, cough and lower abdominal pain with vomiting and diarrhea. Patient has multiple medical problems including status post renal transplant in the 90's, systolic CHF with a flutter prior history of pneumonia, sepsis and ARDS with recurrent hospitalizations all last year.  Patient had upper respiratory symptoms for the last week that has worsened. He states tonight he started having vomiting and diarrhea. He has no peritoneal signs but does have tenderness over the suprapubic area. He is wheezing and initially when EMS arrived he was satting 89% on room air. Patient does have diffuse distal edema and no wheezing be food related versus infectious. She has no history of COPD and does not use inhalers regularly. He states his symptoms are improved with oxygen.  Concern for infection versus fluid overload versus cardiac pathology. Also concern for possible UTI.  CBC, CMP, UA, BNP, troponin, ABG, lactic acid pending.  EKG and chest x-ray pending   5:29 AM Normal lactate but temp of 102 and leukocytosis of 15,000.  Troponin neg.  5:55 AM CXR with signs of fluid overload but no focal consolidation.  BNP elevated at 140,215 from 500 in dec.  This could be the cause of SOB, cough and wheezing.  Creatinine is stable at 3.5.  UA neg for infection.  Unknown source of fever and blood cultures sent.  Will give lasix for CHF and admit for CHF exacerbation and fever of unknown origin.   Gwyneth Sprout, MD 03/12/12 [id number]  Gwyneth Sprout, MD 03/12/12 [phone]

## 2012-03-12 NOTE — Progress Notes (Signed)
Pt EKG showed NSR with possible Left atrial enlargement.  Dr. Criselda Peaches notified.  Instructed will be up on floor to see pt.  Will continue to monitor.  Amanda Pea, Charity fundraiser.

## 2012-03-12 NOTE — ED Notes (Signed)
Admitting MD at the bedside.  

## 2012-03-12 NOTE — Progress Notes (Signed)
ANTIBIOTIC CONSULT NOTE - INITIAL  Pharmacy Consult for cefepime Indication: Fever  No Known Allergies  Patient Measurements: Height: 6' 1.5" (186.7 cm) Weight: 264 lb 1.8 oz (119.8 kg) (scale ) IBW/kg (Calculated) : 81.05  Vital Signs: Temp: 98.2 F (36.8 C) (02/14 1044) Temp src: Oral (02/14 1044) BP: 173/101 mmHg (02/14 1044) Pulse Rate: 84 (02/14 1044) Intake/Output from previous day:   Intake/Output from this shift: Total I/O In: -  Out: 600 [Urine:600]  Labs:  Recent Labs  03/12/12 0448  WBC 15.5*  HGB 11.7*  PLT 129*  CREATININE 3.51*   Estimated Creatinine Clearance: 32.5 ml/min (by C-G formula based on Cr of 3.51). No results found for this basename: VANCOTROUGH, VANCOPEAK, VANCORANDOM, GENTTROUGH, GENTPEAK, GENTRANDOM, TOBRATROUGH, TOBRAPEAK, TOBRARND, AMIKACINPEAK, AMIKACINTROU, AMIKACIN,  in the last 72 hours   Microbiology: No results found for this or any previous visit (from the past 720 hour(s)).  Medical History: Past Medical History  Diagnosis Date  . Hypertension   . Hepatitis C 1996  . CKD (chronic kidney disease), stage IV     a. s/p renal transplant 1996.  Marland Kitchen Pneumonia   . HLD (hyperlipidemia)   . Nonischemic cardiomyopathy     a. unknown etiology, EF 30-35% by echo 09/2011;  b. 09/2011 Lexi MV EF 42%, no ischemia/infarct.  . Systolic CHF   . Atrial flutter     a. in the setting of sepsis 05/2011; on amio; no anticoagulation    Medications:  Anti-infectives   Start     Dose/Rate Route Frequency Ordered Stop   03/12/12 1300  oseltamivir (TAMIFLU) capsule 75 mg     75 mg Oral 2 times daily 03/12/12 1113 03/17/12 0959   03/12/12 1300  ceFEPIme (MAXIPIME) 2 g in dextrose 5 % 50 mL IVPB     2 g 100 mL/hr over 30 Minutes Intravenous Every 24 hours 03/12/12 1154       Assessment: Chad [name] with history of renal transplant in 1996 presented to the hospital with fever, cough and abdominal pain. He is febrile with a Tmax of 102.5 and WBC is  elevated at 15.5. He will be started on cefepime for empiric coverage. He was also started on tamiflu to r/o flu. Flu panel has been reported as negative. Blood cultures are pending.  Goal of Therapy:  Eradication of infection  Plan:  1. Cefepime 2gm IV Q24H 2. F/u renal fxn, C&S, clinical status 3. MD - Consider DC tamiflu  Jeffie Widdowson, Drake Leach 03/12/2012,11:Chad AM

## 2012-03-12 NOTE — ED Notes (Signed)
Pt from  Home via ems. Pt c/o n/v/d and ABD pain . Pt c/o sob and weakness since Monday.

## 2012-03-12 NOTE — H&P (Signed)
Chad [name] is an 57 y.o. male.   Chief Complaint: Fevers, rhinorrhea, cough, abdominal pain HPI:   Chad [name] is a 57yo man with a complicated PMH including CKD s/p a renal transplant in 1996, HTN, HCV, HLD, NICM, Aflutter who presented to the [facility] complaining of fever, cough, abdominal pain which started on Monday of this week (4 days prior to admission).  He reports that since being in the hospital in December for bronchitis, he has had on again, off again sinus congestion, rhinorrhea and cough. The symptoms started again on Monday.  He also reports that he has suprapubic/periumbilical abdominal pain and associated diarrhea, about 4 X per day. He describes the diarrhea as brown, watery and without blood.  He has no sick contacts.  He did have antibiotics in December for bronchitis. Associated symptoms include decreased PO intake, including fluids.  He did note that his urine was more concentrated, but without dysuria.  He specifically denied myalgias, headache, change in vision, chest pain, dysphagia, nausea, vomiting, edema of his legs, pain anywhere.    Past Medical History  Diagnosis Date  . Hypertension   . Hepatitis C 1996  . CKD (chronic kidney disease), stage IV     a. s/p renal transplant 1996.  Marland Kitchen Pneumonia   . HLD (hyperlipidemia)   . Nonischemic cardiomyopathy     a. unknown etiology, EF 30-35% by echo 09/2011;  b. 09/2011 Lexi MV EF 42%, no ischemia/infarct.  . Systolic CHF   . Atrial flutter     a. in the setting of sepsis 05/2011; on amio; no anticoagulation    Past Surgical History  Procedure Laterality Date  . Nephrectomy transplanted organ    . Insertion of dialysis catheter  06/20/2011    Procedure: INSERTION OF DIALYSIS CATHETER;  Surgeon: [name], MD;  Location: [facility] OR;  Service: Vascular;  Laterality: Right;  Ultrasound guided insertion of right internal jugular dialysis catheter  . Multiple extractions with alveoloplasty  06/27/2011    Procedure: MULTIPLE  EXTRACION WITH ALVEOLOPLASTY;  Surgeon: [name], DDS;  Location: [facility] OR;  Service: Oral Surgery;  Laterality: N/A;  Extraction  of tooth # 14 with alveoloplasty  . Hip surgery    . Joint replacement      left hip    Family History  Problem Relation Age of Onset  . Heart disease Neg Hx   His mother also had ALS  Social History:  reports that he has never smoked. He has never used smokeless tobacco. He reports that he does not drink alcohol or use illicit drugs. This was confirmed with the patient.   Allergies: No Known Allergies  Medications:  Rapamune 2 mg daily Lasix 40-80mg  BID Coreg 25 mg BID Hydralazine 75mg  TID Amiodarone 200mg  qdaily Kdur qdaily Pravastatin 40mg  Qdaily ASA 81mg Qdaily Omeprazole 20mg  qdaily  Results for orders placed during the hospital encounter of 03/12/12 (from the past 48 hour(s))  CBC WITH DIFFERENTIAL     Status: Abnormal   Collection Time    03/12/12  4:48 AM      Result Value Range   WBC 15.5 (*) 4.0 - 10.5 K/uL   RBC 4.14 (*) 4.22 - 5.81 MIL/uL   Hemoglobin 11.7 (*) 13.0 - 17.0 g/dL   HCT 47.8 (*) 29.5 - 62.1 %   MCV 88.6  78.0 - 100.0 fL   MCH 28.3  26.0 - 34.0 pg   MCHC 31.9  30.0 - 36.0 g/dL   RDW 30.8 (*)  11.5 - 15.5 %   Platelets 129 (*) 150 - 400 K/uL   Neutrophils Relative 83 (*) 43 - 77 %   Neutro Abs 12.8 (*) 1.7 - 7.7 K/uL   Lymphocytes Relative 5 (*) 12 - 46 %   Lymphs Abs 0.8  0.7 - 4.0 K/uL   Monocytes Relative 10  3 - 12 %   Monocytes Absolute 1.6 (*) 0.1 - 1.0 K/uL   Eosinophils Relative 2  0 - 5 %   Eosinophils Absolute 0.2  0.0 - 0.7 K/uL   Basophils Relative 0  0 - 1 %   Basophils Absolute 0.0  0.0 - 0.1 K/uL  COMPREHENSIVE METABOLIC PANEL     Status: Abnormal   Collection Time    03/12/12  4:48 AM      Result Value Range   Sodium 132 (*) 135 - 145 mEq/L   Potassium 4.3  3.5 - 5.1 mEq/L   Chloride 96  96 - 112 mEq/L   CO2 23  19 - 32 mEq/L   Glucose, Bld 137 (*) 70 - 99 mg/dL   BUN 76 (*) 6 - 23  mg/dL   Creatinine, Ser 6.29 (*) 0.50 - 1.35 mg/dL   Calcium 8.9  8.4 - 52.8 mg/dL   Total Protein 7.9  6.0 - 8.3 g/dL   Albumin 3.3 (*) 3.5 - 5.2 g/dL   AST 28  0 - 37 U/L   ALT 17  0 - 53 U/L   Alkaline Phosphatase 77  39 - 117 U/L   Total Bilirubin 0.4  0.3 - 1.2 mg/dL   GFR calc non Af Amer 18 (*) >90 mL/min   GFR calc Af Amer 21 (*) >90 mL/min   Comment:            The eGFR has been calculated     using the CKD EPI equation.     This calculation has not been     validated in all clinical     situations.     eGFR's persistently     <90 mL/min signify     possible Chronic Kidney Disease.  PRO B NATRIURETIC PEPTIDE     Status: Abnormal   Collection Time    03/12/12  4:49 AM      Result Value Range   Pro B Natriuretic peptide (BNP) 14215.0 (*) 0 - 125 pg/mL  POCT I-STAT TROPONIN I     Status: None   Collection Time    03/12/12  4:55 AM      Result Value Range   Troponin i, poc 0.03  0.00 - 0.08 ng/mL   Comment 3            Comment: Due to the release kinetics of cTnI,     a negative result within the first hours     of the onset of symptoms does not rule out     myocardial infarction with certainty.     If myocardial infarction is still suspected,     repeat the test at appropriate intervals.  CG4 I-STAT (LACTIC ACID)     Status: None   Collection Time    03/12/12  4:57 AM      Result Value Range   Lactic Acid, Venous 1.22  0.5 - 2.2 mmol/L  URINALYSIS, ROUTINE W REFLEX MICROSCOPIC     Status: Abnormal   Collection Time    03/12/12  5:24 AM      Result Value Range   Color,  Urine YELLOW  YELLOW   APPearance CLEAR  CLEAR   Specific Gravity, Urine 1.019  1.005 - 1.030   pH 5.0  5.0 - 8.0   Glucose, UA NEGATIVE  NEGATIVE mg/dL   Hgb urine dipstick NEGATIVE  NEGATIVE   Bilirubin Urine NEGATIVE  NEGATIVE   Ketones, ur NEGATIVE  NEGATIVE mg/dL   Protein, ur >244 (*) NEGATIVE mg/dL   Urobilinogen, UA 0.2  0.0 - 1.0 mg/dL   Nitrite NEGATIVE  NEGATIVE   Leukocytes, UA  NEGATIVE  NEGATIVE  URINE MICROSCOPIC-ADD ON     Status: Abnormal   Collection Time    03/12/12  5:24 AM      Result Value Range   WBC, UA 0-2  <3 WBC/hpf   RBC / HPF 0-2  <3 RBC/hpf   Bacteria, UA RARE  RARE   Casts HYALINE CASTS (*) NEGATIVE  POCT I-STAT 3, BLOOD GAS (G3+)     Status: Abnormal   Collection Time    03/12/12  6:06 AM      Result Value Range   pH, Arterial 7.288 (*) 7.350 - 7.450   pCO2 arterial 48.2 (*) 35.0 - 45.0 mmHg   pO2, Arterial 73.0 (*) 80.0 - 100.0 mmHg   Bicarbonate 22.6  20.0 - 24.0 mEq/L   TCO2 24  0 - 100 mmol/L   O2 Saturation 90.0     Acid-base deficit 4.0 (*) 0.0 - 2.0 mmol/L   Patient temperature 102.1 F     Collection site RADIAL, ALLEN'S TEST ACCEPTABLE     Drawn by Operator     Sample type ARTERIAL     Dg Chest 2 View  03/12/2012  *RADIOLOGY REPORT*  Clinical Data: Wheezing, cough and shortness of breath.  CHEST - 2 VIEW  Comparison: Chest radiograph performed 12/09/2011  Findings: The lungs are mildly hypoexpanded.  Vascular congestion and vascular crowding are seen.  Mildly increased interstitial markings likely reflect mild interstitial edema, less prominent than on the prior study.  No pleural effusion or pneumothorax is seen.  The heart is mildly enlarged.  No acute osseous abnormalities are seen.  IMPRESSION: Lungs mildly hypoexpanded.  Vascular congestion and mild cardiomegaly, with mildly increased interstitial markings, likely reflecting mild interstitial edema.  This is less prominent than on the prior study.   Original Report Authenticated By: Tonia Ghent, M.D.     Review of Systems  Constitutional: Positive for fever, chills and malaise/fatigue. Negative for weight loss and diaphoresis.  HENT: Positive for ear discharge. Negative for ear pain, congestion and neck pain.   Eyes: Negative for blurred vision, double vision and photophobia.  Respiratory: Positive for cough. Negative for sputum production and shortness of breath.    Cardiovascular: Positive for leg swelling. Negative for chest pain and palpitations.  Gastrointestinal: Positive for abdominal pain and diarrhea. Negative for blood in stool and melena.  Genitourinary: Negative for dysuria, urgency and frequency.  Musculoskeletal: Negative for myalgias.  Skin: Negative for rash.  Neurological: Negative for dizziness and headaches.    Blood pressure 165/111, pulse 91, temperature 102.5 F (39.2 C), temperature source Rectal, resp. rate 25, SpO2 99.00%. Physical Exam  Constitutional: He is oriented to person, place, and time. He appears well-developed. He appears ill. No distress.  HENT:  Head: Normocephalic and atraumatic.  Right Ear: Tympanic membrane normal. No drainage, swelling or tenderness.  Left Ear: Tympanic membrane normal. No drainage, swelling or tenderness.  Eyes: Conjunctivae and EOM are normal. Pupils are equal, round, and reactive to  light. Right eye exhibits no chemosis and no discharge. Left eye exhibits no chemosis and no discharge. No scleral icterus.  Cardiovascular: Normal rate and regular rhythm.   Pulses:      Radial pulses are 1+ on the right side, and 1+ on the left side.  Respiratory: Breath sounds normal. No respiratory distress.  GI: Soft. Normal appearance and bowel sounds are normal. There is tenderness in the periumbilical area and suprapubic area.  Musculoskeletal: He exhibits edema. He exhibits no tenderness.  Wearing compression stockings and bandage to RLE.  He requested that the bandage only be removed by wound care nurses.   Neurological: He is alert and oriented to person, place, and time.  Skin: Skin is warm. No rash noted. No erythema.  Psychiatric: He has a normal mood and affect. His behavior is normal.    CXR: IMPRESSION:  Lungs mildly hypoexpanded. Vascular congestion and mild  cardiomegaly, with mildly increased interstitial markings, likely  reflecting mild interstitial edema. This is less prominent than  on  the prior study.  Repeat CBC with WBC of 13.1  EKG unchanged from previous  Assessment/Plan  1. Fever, diarrhea, cough - Unclear source at the moment - CXR in the ED did not reveal an obvious pneumonia and UA was not consistent with UTI - Concern due to patients immunocompromised state - Wound consult to evaluate LE wound - Will start Cefepime, renally dosed, and Flagyl for possible sepsis vs. Cdiff - BC X 2 are pending, Cdiff PCR pending - Concern for flu on admission as well, but PCR is negative - Add vancomycin if patient continues to be febrile.   2. Acute renal failure in the setting of renal transplant - Check Sirolimus level - Consider Renal consult in AM if necessary - Patient is likely somewhat volume overloaded, given significant rise in BNP since December - Lasix 80mg  IV BID X 2 days then re-evaluate - This is a tricky situation, given possibility that patient also with acute illness and possible need for IVF.  He will have a tenuous course, but his BP is stable and actually high at the moment.   3. NICM - Likely volume overloaded, given BNP rise - IV lasix as noted above  4. HTN - Elevated BP today, he did not take his meds this morning - PO meds - IV hydralazine for SBP > 160  5. HLD - Continue pravastatin.   6. Hyponatremia - Check serum, urine osm - Possibly explained by volume overload.  Code: Full Discussion with family: Patient   Anticipated LOS 2-3 days.   Tasia Liz 03/12/2012, 8:57 AM

## 2012-03-12 NOTE — Progress Notes (Signed)
Utilization Review Completed.   Trusten Hume, RN, BSN Nurse Case Manager  [phone]  

## 2012-03-12 NOTE — Consult Note (Addendum)
WOC consult Note Reason for Consult: Consult requested for right leg.  Pt has been followed at the outpatient wound care center and was told last week that his wound was almost completely healed.  He has been wearing Una boot to right leg and changing Q week. Wound type: full thickness stasis ulcer Measurement: No open wound to measure, area healed Wound bed: pink dry scar tissue Drainage (amount, consistency, odor) no odor or drainage Periwound:intact skin surrounding Dressing procedure/placement/frequency: Foam dressing to protect site.  Pt can call wound care center after discharge to determine if follow-up is needed.  He has a compression stocking at home which he can wear to control edema.Will not plan to follow further unless re-consulted.  [address], RN, MSN, [employer]  [phone]

## 2012-03-13 ENCOUNTER — Inpatient Hospital Stay (HOSPITAL_COMMUNITY): Payer: Self-pay

## 2012-03-13 DIAGNOSIS — I10 Essential (primary) hypertension: Secondary | ICD-10-CM

## 2012-03-13 DIAGNOSIS — I5023 Acute on chronic systolic (congestive) heart failure: Principal | ICD-10-CM

## 2012-03-13 DIAGNOSIS — R112 Nausea with vomiting, unspecified: Secondary | ICD-10-CM | POA: Diagnosis present

## 2012-03-13 DIAGNOSIS — I428 Other cardiomyopathies: Secondary | ICD-10-CM

## 2012-03-13 DIAGNOSIS — Z7952 Long term (current) use of systemic steroids: Secondary | ICD-10-CM

## 2012-03-13 DIAGNOSIS — Z940 Kidney transplant status: Secondary | ICD-10-CM

## 2012-03-13 DIAGNOSIS — I272 Pulmonary hypertension, unspecified: Secondary | ICD-10-CM | POA: Diagnosis present

## 2012-03-13 DIAGNOSIS — Z9229 Personal history of other drug therapy: Secondary | ICD-10-CM

## 2012-03-13 LAB — CBC
HCT: 35 % — ABNORMAL LOW (ref 39.0–52.0)
MCH: 28 pg (ref 26.0–34.0)
MCV: 88.4 fL (ref 78.0–100.0)
Platelets: 141 10*3/uL — ABNORMAL LOW (ref 150–400)
RBC: 3.96 MIL/uL — ABNORMAL LOW (ref 4.22–5.81)

## 2012-03-13 LAB — BASIC METABOLIC PANEL
CO2: 23 mEq/L (ref 19–32)
Calcium: 9.3 mg/dL (ref 8.4–10.5)
Glucose, Bld: 108 mg/dL — ABNORMAL HIGH (ref 70–99)
Sodium: 136 mEq/L (ref 135–145)

## 2012-03-13 MED ORDER — ISOSORBIDE MONONITRATE ER 60 MG PO TB24
60.0000 mg | ORAL_TABLET | Freq: Every day | ORAL | Status: DC
  Administered 2012-03-13 – 2012-03-16 (×4): 60 mg via ORAL
  Filled 2012-03-13 (×5): qty 1

## 2012-03-13 NOTE — Progress Notes (Signed)
PATIENT DETAILS Name: Chad [name] Age: 57 y.o. Sex: male Date of Birth: 1955-04-12 Admit Date: 03/12/2012 Admitting Physician Inez Catalina, MD PCP:FOX,RICHARD F, MD  Subjective: No further diarrhea Abdominal pain resolved No further fever SOB better  Assessment/Plan: Active Problems: Fever -had abdominal pain/diarrhea with fever -UA and CXR neg UTI and PNA respectively -Influenza PCR neg -C Diff PCR pending-but diarrhea has resolved -monitor on current antibiotics-Cefepime and Flagyl-await Cultures and stool studies  Acute on chronic Systolic heart failure -around 20 lbs weight gain and SOB as well. -cards consulted -on IV lasix 60 mg BID -not a candidate for ACEI/ARB's given CKD stage 4 -on Imdur/Hydralazine  CKD stage IV-s/p Renal Transplant -creatinine close to usual baseline -monitor lytes periodically -on prednisone and Sirolimus-level pending  Afibe/Aflutter -stable -on Amiodarone and Coreg -Reviewed outpatient primary cardiologist's note-not a coumadin candidate given prior h/o GI bleed  HTN -BP with moderate control -c/w Coreg, Hydralazine, Imdur  GERD -PPI  Disposition: Remain inpatient  DVT Prophylaxis: Prophylactic Heparin  Code Status: Full code  Procedures:  None  CONSULTS:  cardiology  PHYSICAL EXAM: Vital signs in last 24 hours: Filed Vitals:   03/12/12 1114 03/12/12 1336 03/12/12 2203 03/13/12 0639  BP:  165/96 157/88 155/90  Pulse:  96 87 85  Temp:  98.4 F (36.9 C) 99.7 F (37.6 C) 98.5 F (36.9 C)  TempSrc:  Oral Oral Oral  Resp:  24 22 24   Height:      Weight:    118.9 kg (262 lb 2 oz)  SpO2: 100% 93% 92% 92%    Weight change:  Body mass index is 34.11 kg/(m^2).   Gen Exam: Awake and alert with clear speech.   Neck: Supple, + JVD.   Chest: B/L Clear.   CVS: S1 S2 Regular, no murmurs.  Abdomen: soft, BS +, non tender, non distended.  Extremities: 1+ edema, lower extremities warm to touch. Neurologic: Non  Focal.   Skin: No Rash.   Wounds: N/A.    Intake/Output from previous day:  Intake/Output Summary (Last 24 hours) at 03/13/12 1344 Last data filed at 03/13/12 0700  Gross per 24 hour  Intake    960 ml  Output   1801 ml  Net   -841 ml     LAB RESULTS: CBC  Recent Labs Lab 03/12/12 0448 03/12/12 1222 03/13/12 0530  WBC 15.5* 13.1* 16.9*  HGB 11.7* 10.6* 11.1*  HCT 36.7* 34.1* 35.0*  PLT 129* 125* 141*  MCV 88.6 89.0 88.4  MCH 28.3 27.7 28.0  MCHC 31.9 31.1 31.7  RDW 18.6* 18.5* 18.2*  LYMPHSABS 0.8  --   --   MONOABS 1.6*  --   --   EOSABS 0.2  --   --   BASOSABS 0.0  --   --     Chemistries   Recent Labs Lab 03/12/12 0448 03/12/12 1222 03/13/12 0530  NA 132*  --  136  K 4.3  --  4.3  CL 96  --  100  CO2 23  --  23  GLUCOSE 137*  --  108*  BUN 76*  --  77*  CREATININE 3.51* 3.54* 3.54*  CALCIUM 8.9  --  9.3    CBG: No results found for this basename: GLUCAP,  in the last 168 hours  GFR Estimated Creatinine Clearance: 32.1 ml/min (by C-G formula based on Cr of 3.54).  Coagulation profile No results found for this basename: INR, PROTIME,  in the last 168 hours  Cardiac Enzymes No results found for this basename: CK, CKMB, TROPONINI, MYOGLOBIN,  in the last 168 hours  No components found with this basename: POCBNP,  No results found for this basename: DDIMER,  in the last 72 hours No results found for this basename: HGBA1C,  in the last 72 hours No results found for this basename: CHOL, HDL, LDLCALC, TRIG, CHOLHDL, LDLDIRECT,  in the last 72 hours No results found for this basename: TSH, T4TOTAL, FREET3, T3FREE, THYROIDAB,  in the last 72 hours No results found for this basename: VITAMINB12, FOLATE, FERRITIN, TIBC, IRON, RETICCTPCT,  in the last 72 hours No results found for this basename: LIPASE, AMYLASE,  in the last 72 hours  Urine Studies No results found for this basename: UACOL, UAPR, USPG, UPH, UTP, UGL, UKET, UBIL, UHGB, UNIT, UROB, ULEU,  UEPI, UWBC, URBC, UBAC, CAST, CRYS, UCOM, BILUA,  in the last 72 hours  MICROBIOLOGY: No results found for this or any previous visit (from the past 240 hour(s)).  RADIOLOGY STUDIES/RESULTS: Dg Chest 1 View  03/13/2012  *RADIOLOGY REPORT*  Clinical Data: Shortness of breath, cough and fever.  Evaluate for volume overload.  CHEST - 1 VIEW  Comparison: Chest x-ray 03/12/2012.  Findings: There is cephalization of the pulmonary vasculature and slight indistinctness of the interstitial markings suggestive of mild pulmonary edema.  No definite consolidative airspace disease. Mild bibasilar subsegmental atelectasis.  No pleural effusions. Heart size is mildly enlarged (unchanged). The patient is rotated to the right on today's exam, resulting in distortion of the mediastinal contours and reduced diagnostic sensitivity and specificity for mediastinal pathology.  IMPRESSION: 1.  Mild cardiomegaly with evidence of mild interstitial pulmonary edema; findings suggestive of mild congestive heart failure.   Original Report Authenticated By: Trudie Reed, M.D.    Dg Chest 2 View  03/12/2012  *RADIOLOGY REPORT*  Clinical Data: Wheezing, cough and shortness of breath.  CHEST - 2 VIEW  Comparison: Chest radiograph performed 12/09/2011  Findings: The lungs are mildly hypoexpanded.  Vascular congestion and vascular crowding are seen.  Mildly increased interstitial markings likely reflect mild interstitial edema, less prominent than on the prior study.  No pleural effusion or pneumothorax is seen.  The heart is mildly enlarged.  No acute osseous abnormalities are seen.  IMPRESSION: Lungs mildly hypoexpanded.  Vascular congestion and mild cardiomegaly, with mildly increased interstitial markings, likely reflecting mild interstitial edema.  This is less prominent than on the prior study.   Original Report Authenticated By: Tonia Ghent, M.D.     MEDICATIONS: Scheduled Meds: . amiodarone  200 mg Oral Daily  . aspirin EC   81 mg Oral Daily  . carvedilol  25 mg Oral BID WC  . ceFEPime (MAXIPIME) IV  2 g Intravenous Q24H  . heparin  5,000 Units Subcutaneous Q8H  . hydrALAZINE  75 mg Oral TID  . isosorbide mononitrate  60 mg Oral Daily  . metroNIDAZOLE  500 mg Oral Q8H  . pantoprazole  40 mg Oral Daily  . potassium chloride  10 mEq Oral Daily  . predniSONE  10 mg Oral Q breakfast  . simvastatin  10 mg Oral q1800  . sirolimus  2 mg Oral Daily  . sodium chloride  3 mL Intravenous Q12H   Continuous Infusions:  PRN Meds:.sodium chloride, acetaminophen, acetaminophen, alum & mag hydroxide-simeth, guaiFENesin-dextromethorphan, HYDROcodone-acetaminophen, ondansetron (ZOFRAN) IV, ondansetron, sodium chloride  Antibiotics: Anti-infectives   Start     Dose/Rate Route Frequency Ordered Stop   03/12/12 1400  metroNIDAZOLE (FLAGYL)  tablet 500 mg     500 mg Oral 3 times per day 03/12/12 1308     03/12/12 1300  oseltamivir (TAMIFLU) capsule 75 mg  Status:  Discontinued     75 mg Oral 2 times daily 03/12/12 1113 03/12/12 1204   03/12/12 1300  ceFEPIme (MAXIPIME) 2 g in dextrose 5 % 50 mL IVPB     2 g 100 mL/hr over 30 Minutes Intravenous Every 24 hours 03/12/12 1154         Jeoffrey Massed, MD  [facility] Pager:[phone]  If 7PM-7AM, please contact night-coverage [email or web address] Password [email or web address] 03/13/2012, 1:44 PM   LOS: 1 day

## 2012-03-13 NOTE — Consult Note (Addendum)
CARDIOLOGY CONSULT NOTE  Patient ID: Chad [name], MRN: [id number], DOB/AGE: [birth year] 57 y.o. Admit date: 03/12/2012 Date of Consult: 03/13/2012  Primary Physician: [name], MD Primary Cardiologist: Marca Ancona, MD  Reason for Consult: CHF  HPI: Patient reports awakening at 3:30 Friday a.m. with dyspnea, nausea, and vomiting. He also reports having had diarrhea for the last 5 days, which has since resolved. He denied CP or tachycardia palpitations. Admission CXR suggestive of mild interstitial edema. BNP > 14,000, on admission. Patient was placed on IV Lasix diuretic regimen at 80 twice a day Patient presents with CKD and creatinine has remained stable at 3.5, x48 hours.  He is currently hemodynamically stable and reporting less shortness of breath, since admission. He also has history of paroxysmal atrial flutter, and is maintaining NSR on amiodarone. He is not on anticoagulation therapy, secondary to history of GIB. Most recent echocardiogram, 11/2011, yielded EF 35% with mild RVD; mild MR, and PHTN (RVSP 70 mmHg).  Since being here in the hospital the patient is diuresing. He feels better. He says he is no longer having nausea vomiting or abdominal pain.   The patient has a history of nonischemic cardiomyopathy. Earlier in 2013 he had a very complex illness and fortunately improved. When he was seen last in the cardiology office in December, 2013, he was stable and there were plans for 4-5 week followup. He did not return for that visit.  Past Medical History  Diagnosis Date  . Hypertension   . Hepatitis C 1996  . CKD (chronic kidney disease), stage IV     a. s/p renal transplant 1996.  Marland Kitchen Pneumonia   . HLD (hyperlipidemia)   . Nonischemic cardiomyopathy     a. unknown etiology, EF 30-35% by echo 09/2011;  b. 09/2011 Lexi MV EF 42%, no ischemia/infarct.  . Systolic CHF   . Atrial flutter     a. in the setting of sepsis 05/2011; on amio; no anticoagulation      Surgical  History:  Past Surgical History  Procedure Laterality Date  . Nephrectomy transplanted organ    . Insertion of dialysis catheter  06/20/2011    Procedure: INSERTION OF DIALYSIS CATHETER;  Surgeon: [name], MD;  Location: [facility] OR;  Service: Vascular;  Laterality: Right;  Ultrasound guided insertion of right internal jugular dialysis catheter  . Multiple extractions with alveoloplasty  06/27/2011    Procedure: MULTIPLE EXTRACION WITH ALVEOLOPLASTY;  Surgeon: [name], DDS;  Location: [facility] OR;  Service: Oral Surgery;  Laterality: N/A;  Extraction  of tooth # 14 with alveoloplasty  . Hip surgery    . Joint replacement      left hip     Home Meds: Prior to Admission medications   Medication Sig Start Date End Date Taking? Authorizing Provider  amiodarone (PACERONE) 200 MG tablet Take 200 mg by mouth daily. 07/16/11 07/15/12 Yes Daniel J Angiulli, PA  aspirin EC 81 MG tablet Take 81 mg by mouth daily.   Yes Historical Provider, MD  carvedilol (COREG) 25 MG tablet Take 25 mg by mouth 2 (two) times daily with a meal.   Yes Historical Provider, MD  furosemide (LASIX) 80 MG tablet Take 40-80 mg by mouth 2 (two) times daily.   Yes Historical Provider, MD  hydrALAZINE (APRESOLINE) 50 MG tablet Take 75 mg by mouth 3 (three) times daily.   Yes Historical Provider, MD  omeprazole (PRILOSEC) 20 MG capsule Take 20 mg by mouth daily.  10/28/11  Yes Historical Provider, MD  potassium chloride (K-DUR,KLOR-CON) 10 MEQ tablet Take 10 mEq by mouth daily.   Yes Historical Provider, MD  pravastatin (PRAVACHOL) 40 MG tablet Take 40 mg by mouth daily.   Yes Historical Provider, MD  predniSONE (DELTASONE) 10 MG tablet Take 10 mg by mouth daily.   Yes Historical Provider, MD  sirolimus (RAPAMUNE) 1 MG tablet Take 2 mg by mouth daily.   Yes Historical Provider, MD    Inpatient Medications:  . amiodarone  200 mg Oral Daily  . aspirin EC  81 mg Oral Daily  . carvedilol  25 mg Oral BID WC  . ceFEPime  (MAXIPIME) IV  2 g Intravenous Q24H  . heparin  5,000 Units Subcutaneous Q8H  . hydrALAZINE  75 mg Oral TID  . metroNIDAZOLE  500 mg Oral Q8H  . pantoprazole  40 mg Oral Daily  . potassium chloride  10 mEq Oral Daily  . predniSONE  10 mg Oral Q breakfast  . simvastatin  10 mg Oral q1800  . sirolimus  2 mg Oral Daily  . sodium chloride  3 mL Intravenous Q12H    Allergies: No Known Allergies  History   Social History  . Marital Status: Single    Spouse Name: N/A    Number of Children: N/A  . Years of Education: N/A   Occupational History  . Not on file.   Social History Main Topics  . Smoking status: Never Smoker   . Smokeless tobacco: Never Used  . Alcohol Use: No  . Drug Use: No  . Sexually Active: No   Other Topics Concern  . Not on file   Social History Narrative  . No narrative on file     Family History  Problem Relation Age of Onset  . Heart disease Neg Hx      Review of Systems: General: negative for chills, fever, night sweats or weight changes.  Cardiovascular: negative for chest pain, palpitations Dermatological: negative for rash Respiratory: negative for cough or wheezing Urologic: negative for hematuria Abdominal: negative for bright red blood per rectum, melena, or hematemesis Neurologic: negative for visual changes, syncope, or dizziness All other systems reviewed and are otherwise negative except as noted above.  Labs: No results found for this basename: CKTOTAL, CKMB, TROPONINI,  in the last 72 hours Lab Results  Component Value Date   WBC 16.9* 03/13/2012   HGB 11.1* 03/13/2012   HCT 35.0* 03/13/2012   MCV 88.4 03/13/2012   PLT 141* 03/13/2012    Recent Labs Lab 03/12/12 0448  03/13/12 0530  NA 132*  --  136  K 4.3  --  4.3  CL 96  --  100  CO2 23  --  23  BUN 76*  --  77*  CREATININE 3.51*  < > 3.54*  CALCIUM 8.9  --  9.3  PROT 7.9  --   --   BILITOT 0.4  --   --   ALKPHOS 77  --   --   ALT 17  --   --   AST 28  --   --     GLUCOSE 137*  --  108*  < > = values in this interval not displayed. Lab Results  Component Value Date   CHOL 229* 10/22/2011   HDL 47.70 10/22/2011   TRIG 149.0 10/22/2011   Lab Results  Component Value Date   DDIMER  Value: 13.45        AT THE INHOUSE  ESTABLISHED CUTOFF VALUE OF 0.48 ug/mL FEU, THIS ASSAY HAS BEEN DOCUMENTED IN THE LITERATURE TO HAVE A SENSITIVITY AND NEGATIVE PREDICTIVE VALUE OF AT LEAST 98 TO 99%.  THE TEST RESULT SHOULD BE CORRELATED WITH AN ASSESSMENT OF THE CLINICAL PROBABILITY OF DVT / VTE.* 04/07/2009    Radiology/Studies:  Dg Chest 1 View  03/13/2012  *RADIOLOGY REPORT*  Clinical Data: Shortness of breath, cough and fever.  Evaluate for volume overload.  CHEST - 1 VIEW  Comparison: Chest x-ray 03/12/2012.  Findings: There is cephalization of the pulmonary vasculature and slight indistinctness of the interstitial markings suggestive of mild pulmonary edema.  No definite consolidative airspace disease. Mild bibasilar subsegmental atelectasis.  No pleural effusions. Heart size is mildly enlarged (unchanged). The patient is rotated to the right on today's exam, resulting in distortion of the mediastinal contours and reduced diagnostic sensitivity and specificity for mediastinal pathology.  IMPRESSION: 1.  Mild cardiomegaly with evidence of mild interstitial pulmonary edema; findings suggestive of mild congestive heart failure.   Original Report Authenticated By: Trudie Reed, M.D.    Dg Chest 2 View  03/12/2012  *RADIOLOGY REPORT*  Clinical Data: Wheezing, cough and shortness of breath.  CHEST - 2 VIEW  Comparison: Chest radiograph performed 12/09/2011  Findings: The lungs are mildly hypoexpanded.  Vascular congestion and vascular crowding are seen.  Mildly increased interstitial markings likely reflect mild interstitial edema, less prominent than on the prior study.  No pleural effusion or pneumothorax is seen.  The heart is mildly enlarged.  No acute osseous abnormalities  are seen.  IMPRESSION: Lungs mildly hypoexpanded.  Vascular congestion and mild cardiomegaly, with mildly increased interstitial markings, likely reflecting mild interstitial edema.  This is less prominent than on the prior study.   Original Report Authenticated By: Tonia Ghent, M.D.     EKG: NSR 87 bpm, 03/12/12  Physical Exam: Blood pressure 155/90, pulse 85, temperature 98.5 F (36.9 C), temperature source Oral, resp. rate 24, height 6' 1.5" (1.867 m), weight 262 lb 2 oz (118.9 kg), SpO2 92.00%. General: 56 yom, in no acute distress. Head: Normocephalic, atraumatic, sclera non-icteric, no xanthomas, nares are without discharge.  Neck: Negative for carotid bruits. JVD elevated on R, at 60 degrees. Lungs: Clear bilaterally to auscultation without wheezes, rales, or rhonchi. Breathing is unlabored. Heart: RRR with S1 S2. No murmurs, rubs, or gallops appreciated. Abdomen: Soft, non-tender, non-distended with normoactive bowel sounds. No hepatomegaly. No rebound/guarding. No obvious abdominal masses. Msk:  Strength and tone appear normal for age. Extremities: lower RLE lymphedema; 1+ edema on L Neuro: Alert and oriented X 3. Moves all extremities spontaneously. Psych:  Responds to questions appropriately with a normal affect.     Assessment and Plan:     Cardiomyopathy      The patient has a nonischemic cardiomyopathy. When he was last seen in the cardiology office he was on both hydralazine and Imdur. I do not see Imdur  listed at this time. This will be restarted.    Atrial flutter     The patient is holding sinus rhythm on amiodarone. No change in therapy.    Acute on chronic systolic heart failure     At this time the patient is volume overloaded. I cannot be sure if this is the only diagnosis for this admission. It is possible that his GI symptoms were related to this. He needs further diuresis. He is currently on IV diuretics at this time.    CKD (chronic kidney disease)  The patient underwent renal transplant in the past. Since then he is redeveloped significant renal dysfunction. His labs will have to be followed carefully as he is diuresed.    Nausea and vomiting     This was part of his initial complaint. However he seems improved. It is possible this was related to CHF.    Hx of amiodarone therapy     Amiodarone is to be continued at this time. TSH was normal in December, 2013.    Steroid long-term use      It is important always to keep this in mind.    Pulmonary hypertension    This needs to be reassessed when the patient has a followup echo.   Signed, SERPE, EUGENE PA-C 03/13/2012, 10:43 AM Patient seen and examined. I agree with the assessment and plan as detailed above. See also my additional thoughts below.   I have seen and examined the patient. The note written above is a combination of information from me and Mr. Shara Blazing. I have outlined completely the problem list. We need to proceed with further diuresis. Imdur will be added. As part of this evaluation I have reviewed the patient's prior records extensively. I have already mentioned that he failed to followup for one of his outpatient followup visits. It will be very important that he is seen on a regular basis after this hospitalization.  Willa Rough, MD, [facility] 03/13/2012 12:29 PM

## 2012-03-14 DIAGNOSIS — I509 Heart failure, unspecified: Secondary | ICD-10-CM

## 2012-03-14 LAB — CBC
HCT: 33.1 % — ABNORMAL LOW (ref 39.0–52.0)
Hemoglobin: 10.7 g/dL — ABNORMAL LOW (ref 13.0–17.0)
MCH: 28.2 pg (ref 26.0–34.0)
MCV: 87.3 fL (ref 78.0–100.0)
RBC: 3.79 MIL/uL — ABNORMAL LOW (ref 4.22–5.81)

## 2012-03-14 LAB — BASIC METABOLIC PANEL
CO2: 20 mEq/L (ref 19–32)
Chloride: 97 mEq/L (ref 96–112)
Glucose, Bld: 117 mg/dL — ABNORMAL HIGH (ref 70–99)
Potassium: 3.8 mEq/L (ref 3.5–5.1)
Sodium: 134 mEq/L — ABNORMAL LOW (ref 135–145)

## 2012-03-14 LAB — DIFFERENTIAL
Basophils Relative: 0 % (ref 0–1)
Eosinophils Relative: 1 % (ref 0–5)
Lymphs Abs: 1.7 10*3/uL (ref 0.7–4.0)
Monocytes Absolute: 1.2 10*3/uL — ABNORMAL HIGH (ref 0.1–1.0)

## 2012-03-14 MED ORDER — FUROSEMIDE 10 MG/ML IJ SOLN
40.0000 mg | Freq: Once | INTRAMUSCULAR | Status: AC
  Administered 2012-03-14: 40 mg via INTRAVENOUS
  Filled 2012-03-14: qty 4

## 2012-03-14 MED ORDER — SIROLIMUS 1 MG PO TABS
1.5000 mg | ORAL_TABLET | Freq: Every day | ORAL | Status: DC
  Filled 2012-03-14: qty 2

## 2012-03-14 NOTE — Progress Notes (Signed)
Patient: Chad [name] Date of Encounter: 03/14/2012, 8:05 AM Admit date: 03/12/2012     Subjective  Feeling much better, less SOB. No CP.   Objective   Telemetry: NSR Physical Exam: Filed Vitals:   03/14/12 0410  BP: 134/70  Pulse: 72  Temp: 98 F (36.7 C)  Resp: 20   General: Well developed, well nourished, in no acute distress. Head: Normocephalic, atraumatic, sclera non-icteric, no xanthomas, nares are without discharge.  Neck: JVD slightly elevated on R, at 90 degrees. Lungs: diffuse rhonchi, no crackles, wheezes Heart: RRR S1 S2 without murmurs, rubs, or gallops.  Abdomen: Soft, non-tender, non-distended with normoactive bowel sounds. No hepatomegaly. No rebound/guarding. No obvious abdominal masses. Msk:  Strength and tone appear normal for age. Extremities: lower RLE lymphedema; 1+ edema on L Neuro: Alert and oriented X 3. Moves all extremities spontaneously. Psych:  Responds to questions appropriately with a normal affect.    Intake/Output Summary (Last 24 hours) at 03/14/12 0805 Last data filed at 03/14/12 0435  Gross per 24 hour  Intake    720 ml  Output   1150 ml  Net   -430 ml    Inpatient Medications:  . amiodarone  200 mg Oral Daily  . aspirin EC  81 mg Oral Daily  . carvedilol  25 mg Oral BID WC  . ceFEPime (MAXIPIME) IV  2 g Intravenous Q24H  . heparin  5,000 Units Subcutaneous Q8H  . hydrALAZINE  75 mg Oral TID  . isosorbide mononitrate  60 mg Oral Daily  . metroNIDAZOLE  500 mg Oral Q8H  . pantoprazole  40 mg Oral Daily  . potassium chloride  10 mEq Oral Daily  . predniSONE  10 mg Oral Q breakfast  . simvastatin  10 mg Oral q1800  . sirolimus  2 mg Oral Daily  . sodium chloride  3 mL Intravenous Q12H    Labs:  Recent Labs  03/12/12 0448 03/12/12 1222 03/13/12 0530  NA 132*  --  136  K 4.3  --  4.3  CL 96  --  100  CO2 23  --  23  GLUCOSE 137*  --  108*  BUN 76*  --  77*  CREATININE 3.51* 3.54* 3.54*  CALCIUM 8.9  --  9.3     Recent Labs  03/12/12 0448  AST 28  ALT 17  ALKPHOS 77  BILITOT 0.4  PROT 7.9  ALBUMIN 3.3*   No results found for this basename: LIPASE, AMYLASE,  in the last 72 hours  Recent Labs  03/12/12 0448  03/13/12 0530 03/14/12 0610  WBC 15.5*  < > 16.9* 11.6*  NEUTROABS 12.8*  --   --  8.6*  HGB 11.7*  < > 11.1* 10.7*  HCT 36.7*  < > 35.0* 33.1*  MCV 88.6  < > 88.4 87.3  PLT 129*  < > 141* 128*  < > = values in this interval not displayed. No results found for this basename: CKTOTAL, CKMB, TROPONINI,  in the last 72 hours No components found with this basename: POCBNP,  No results found for this basename: DDIMER,  in the last 72 hours No results found for this basename: HGBA1C,  in the last 72 hours No results found for this basename: CHOL, HDL, LDLCALC, TRIG, CHOLHDL,  in the last 72 hours No results found for this basename: TSH, T4TOTAL, FREET3, T3FREE, THYROIDAB,  in the last 72 hours No results found for this basename: VITAMINB12, FOLATE, FERRITIN, TIBC, IRON,  RETICCTPCT,  in the last 72 hours  Radiology/Studies:  Dg Chest 1 View  03/13/2012  *RADIOLOGY REPORT*  Clinical Data: Shortness of breath, cough and fever.  Evaluate for volume overload.  CHEST - 1 VIEW  Comparison: Chest x-ray 03/12/2012.  Findings: There is cephalization of the pulmonary vasculature and slight indistinctness of the interstitial markings suggestive of mild pulmonary edema.  No definite consolidative airspace disease. Mild bibasilar subsegmental atelectasis.  No pleural effusions. Heart size is mildly enlarged (unchanged). The patient is rotated to the right on today's exam, resulting in distortion of the mediastinal contours and reduced diagnostic sensitivity and specificity for mediastinal pathology.  IMPRESSION: 1.  Mild cardiomegaly with evidence of mild interstitial pulmonary edema; findings suggestive of mild congestive heart failure.   Original Report Authenticated By: Trudie Reed, M.D.    Dg  Chest 2 View  03/12/2012  *RADIOLOGY REPORT*  Clinical Data: Wheezing, cough and shortness of breath.  CHEST - 2 VIEW  Comparison: Chest radiograph performed 12/09/2011  Findings: The lungs are mildly hypoexpanded.  Vascular congestion and vascular crowding are seen.  Mildly increased interstitial markings likely reflect mild interstitial edema, less prominent than on the prior study.  No pleural effusion or pneumothorax is seen.  The heart is mildly enlarged.  No acute osseous abnormalities are seen.  IMPRESSION: Lungs mildly hypoexpanded.  Vascular congestion and mild cardiomegaly, with mildly increased interstitial markings, likely reflecting mild interstitial edema.  This is less prominent than on the prior study.   Original Report Authenticated By: Tonia Ghent, M.D.      Assessment and Plan  1 Acute on chronic systolic heart failure  - Minimal net negative diuresis x24 hours, currently not on diuretic  2 Atrial flutter  - maintaining NSR on amiodarone  3 Nonischemic cardiomyopathy  - EF 35% with mild RVD, echocardiogram, 11/2011  - Imdur resumed  4 CKD, stable  - s/p renal transplant  5 PHTN  - Reassess by outpatient followup echocardiogram  PLAN: Patient is currently not on Lasix, and reportedly takes 60 by mouth twice a day at home, as documented. Will give one-time dose of 40 mg IV now, and carefully monitor renal function with followup labs in a.m. Will check BNP in a.m., as well.   Signed, Chad [name], EUGENE PA-C Patient seen and examined. I agree with the assessment and plan as detailed above. See also my additional thoughts below.   The patient is feeling better. He needs to be back on his diuretics.  Chad Rough, MD, [facility] 03/14/2012 10:16 AM

## 2012-03-14 NOTE — Progress Notes (Signed)
PATIENT DETAILS Name: Chad [name] Age: 57 y.o. Sex: male Date of Birth: 01/01/1956 Admit Date: 03/12/2012 Admitting Physician Inez Catalina, MD PCP:FOX,RICHARD F, MD  Subjective: No further diarrhea Abdominal pain resolved No further fever SOB better Pleasant and cooperative.  Assessment/Plan:  Fever -had abdominal pain/diarrhea with fever -Etiology unclear, ?viral gastroenteritis -UA and CXR neg UTI and PNA respectively -Influenza PCR neg -C Diff PCR pending as he has not been able to produce a sample -Will DC cefepime as flagyl as not sure what we are treating.  Acute on chronic Systolic heart failure -around 20 lbs weight gain and SOB as well. -cards consulted -Cards has ordered lasix 40 x 1 today -not a candidate for ACEI/ARB's given CKD stage 4 -on Imdur/Hydralazine  CKD stage IV-s/p Renal Transplant -creatinine close to usual baseline of 3.5. -monitor lytes periodically -on prednisone and Sirolimus-level pending  Afibe/Aflutter -stable -on Amiodarone and Coreg -Reviewed outpatient primary cardiologist's note-not a coumadin candidate given prior h/o GI bleed  HTN -BP with moderate control -c/w Coreg, Hydralazine, Imdur  GERD -PPI  Disposition: Remain inpatient  DVT Prophylaxis: Prophylactic Heparin  Code Status: Full code  Procedures:  None  CONSULTS:  cardiology  PHYSICAL EXAM: Vital signs in last 24 hours: Filed Vitals:   03/13/12 0639 03/13/12 2049 03/14/12 0410 03/14/12 0944  BP: 155/90 106/70 134/70 148/87  Pulse: 85 74 72   Temp: 98.5 F (36.9 C) 98.1 F (36.7 C) 98 F (36.7 C)   TempSrc: Oral Oral Oral   Resp: 24 22 20    Height:      Weight: 118.9 kg (262 lb 2 oz)  118.8 kg (261 lb 14.5 oz)   SpO2: 92% 92% 94%     Weight change: -1 kg (-2 lb 3.3 oz) Body mass index is 34.08 kg/(m^2).   Gen Exam: Awake and alert with clear speech.   Neck: Supple, + JVD.   Chest: B/L Clear.   CVS: S1 S2 Regular, no murmurs.   Abdomen: soft, BS +, non tender, non distended.  Extremities: 1+ edema, lower extremities warm to touch. Neurologic: Non Focal.   Skin: No Rash.   Wounds: N/A.    Intake/Output from previous day:  Intake/Output Summary (Last 24 hours) at 03/14/12 1122 Last data filed at 03/14/12 0435  Gross per 24 hour  Intake    720 ml  Output   1150 ml  Net   -430 ml     LAB RESULTS: CBC  Recent Labs Lab 03/12/12 0448 03/12/12 1222 03/13/12 0530 03/14/12 0610  WBC 15.5* 13.1* 16.9* 11.6*  HGB 11.7* 10.6* 11.1* 10.7*  HCT 36.7* 34.1* 35.0* 33.1*  PLT 129* 125* 141* 128*  MCV 88.6 89.0 88.4 87.3  MCH 28.3 27.7 28.0 28.2  MCHC 31.9 31.1 31.7 32.3  RDW 18.6* 18.5* 18.2* 18.1*  LYMPHSABS 0.8  --   --  1.7  MONOABS 1.6*  --   --  1.2*  EOSABS 0.2  --   --  0.1  BASOSABS 0.0  --   --  0.0    Chemistries   Recent Labs Lab 03/12/12 0448 03/12/12 1222 03/13/12 0530 03/14/12 0610  NA 132*  --  136 134*  K 4.3  --  4.3 3.8  CL 96  --  100 97  CO2 23  --  23 20  GLUCOSE 137*  --  108* 117*  BUN 76*  --  77* 85*  CREATININE 3.51* 3.54* 3.54* 3.77*  CALCIUM 8.9  --  9.3 8.9    CBG: No results found for this basename: GLUCAP,  in the last 168 hours  GFR Estimated Creatinine Clearance: 30.1 ml/min (by C-G formula based on Cr of 3.77).  Coagulation profile No results found for this basename: INR, PROTIME,  in the last 168 hours  Cardiac Enzymes No results found for this basename: CK, CKMB, TROPONINI, MYOGLOBIN,  in the last 168 hours  No components found with this basename: POCBNP,  No results found for this basename: DDIMER,  in the last 72 hours No results found for this basename: HGBA1C,  in the last 72 hours No results found for this basename: CHOL, HDL, LDLCALC, TRIG, CHOLHDL, LDLDIRECT,  in the last 72 hours No results found for this basename: TSH, T4TOTAL, FREET3, T3FREE, THYROIDAB,  in the last 72 hours No results found for this basename: VITAMINB12, FOLATE,  FERRITIN, TIBC, IRON, RETICCTPCT,  in the last 72 hours No results found for this basename: LIPASE, AMYLASE,  in the last 72 hours  Urine Studies No results found for this basename: UACOL, UAPR, USPG, UPH, UTP, UGL, UKET, UBIL, UHGB, UNIT, UROB, ULEU, UEPI, UWBC, URBC, UBAC, CAST, CRYS, UCOM, BILUA,  in the last 72 hours  MICROBIOLOGY: Recent Results (from the past 240 hour(s))  CULTURE, BLOOD (ROUTINE X 2)     Status: None   Collection Time    03/12/12  6:20 AM      Result Value Range Status   Specimen Description BLOOD LEFT ARM   Final   Special Requests BOTTLES DRAWN AEROBIC AND ANAEROBIC   Final   Culture  Setup Time 03/12/2012 15:20   Final   Culture     Final   Value:        BLOOD CULTURE RECEIVED NO GROWTH TO DATE CULTURE WILL BE HELD FOR 5 DAYS BEFORE ISSUING A FINAL NEGATIVE REPORT   Report Status PENDING   Incomplete  CULTURE, BLOOD (ROUTINE X 2)     Status: None   Collection Time    03/12/12  6:30 AM      Result Value Range Status   Specimen Description BLOOD RIGHT ARM   Final   Special Requests BOTTLES DRAWN AEROBIC ONLY 10CC   Final   Culture  Setup Time 03/12/2012 15:20   Final   Culture     Final   Value:        BLOOD CULTURE RECEIVED NO GROWTH TO DATE CULTURE WILL BE HELD FOR 5 DAYS BEFORE ISSUING A FINAL NEGATIVE REPORT   Report Status PENDING   Incomplete    RADIOLOGY STUDIES/RESULTS: Dg Chest 1 View  03/13/2012  *RADIOLOGY REPORT*  Clinical Data: Shortness of breath, cough and fever.  Evaluate for volume overload.  CHEST - 1 VIEW  Comparison: Chest x-ray 03/12/2012.  Findings: There is cephalization of the pulmonary vasculature and slight indistinctness of the interstitial markings suggestive of mild pulmonary edema.  No definite consolidative airspace disease. Mild bibasilar subsegmental atelectasis.  No pleural effusions. Heart size is mildly enlarged (unchanged). The patient is rotated to the right on today's exam, resulting in distortion of the mediastinal  contours and reduced diagnostic sensitivity and specificity for mediastinal pathology.  IMPRESSION: 1.  Mild cardiomegaly with evidence of mild interstitial pulmonary edema; findings suggestive of mild congestive heart failure.   Original Report Authenticated By: Trudie Reed, M.D.    Dg Chest 2 View  03/12/2012  *RADIOLOGY REPORT*  Clinical Data: Wheezing, cough and shortness of breath.  CHEST - 2  VIEW  Comparison: Chest radiograph performed 12/09/2011  Findings: The lungs are mildly hypoexpanded.  Vascular congestion and vascular crowding are seen.  Mildly increased interstitial markings likely reflect mild interstitial edema, less prominent than on the prior study.  No pleural effusion or pneumothorax is seen.  The heart is mildly enlarged.  No acute osseous abnormalities are seen.  IMPRESSION: Lungs mildly hypoexpanded.  Vascular congestion and mild cardiomegaly, with mildly increased interstitial markings, likely reflecting mild interstitial edema.  This is less prominent than on the prior study.   Original Report Authenticated By: Tonia Ghent, M.D.     MEDICATIONS: Scheduled Meds: . amiodarone  200 mg Oral Daily  . aspirin EC  81 mg Oral Daily  . carvedilol  25 mg Oral BID WC  . heparin  5,000 Units Subcutaneous Q8H  . hydrALAZINE  75 mg Oral TID  . isosorbide mononitrate  60 mg Oral Daily  . pantoprazole  40 mg Oral Daily  . potassium chloride  10 mEq Oral Daily  . predniSONE  10 mg Oral Q breakfast  . simvastatin  10 mg Oral q1800  . sirolimus  2 mg Oral Daily  . sodium chloride  3 mL Intravenous Q12H   Continuous Infusions:  PRN Meds:.sodium chloride, acetaminophen, acetaminophen, alum & mag hydroxide-simeth, guaiFENesin-dextromethorphan, HYDROcodone-acetaminophen, ondansetron (ZOFRAN) IV, ondansetron, sodium chloride  Antibiotics: Anti-infectives   Start     Dose/Rate Route Frequency Ordered Stop   03/12/12 1400  metroNIDAZOLE (FLAGYL) tablet 500 mg  Status:  Discontinued      500 mg Oral 3 times per day 03/12/12 1308 03/14/12 0913   03/12/12 1300  oseltamivir (TAMIFLU) capsule 75 mg  Status:  Discontinued     75 mg Oral 2 times daily 03/12/12 1113 03/12/12 1204   03/12/12 1300  ceFEPIme (MAXIPIME) 2 g in dextrose 5 % 50 mL IVPB  Status:  Discontinued     2 g 100 mL/hr over 30 Minutes Intravenous Every 24 hours 03/12/12 1154 03/14/12 0913       Chaya Jan, MD  [name] Pager:[phone]  If 7PM-7AM, please contact night-coverage [email or web address] Password [email or web address] 03/14/2012, 11:22 AM   LOS: 2 days

## 2012-03-15 DIAGNOSIS — R509 Fever, unspecified: Secondary | ICD-10-CM

## 2012-03-15 DIAGNOSIS — I4892 Unspecified atrial flutter: Secondary | ICD-10-CM

## 2012-03-15 LAB — BASIC METABOLIC PANEL
CO2: 23 mEq/L (ref 19–32)
Chloride: 99 mEq/L (ref 96–112)
GFR calc non Af Amer: 17 mL/min — ABNORMAL LOW (ref >90)
Glucose, Bld: 141 mg/dL — ABNORMAL HIGH (ref 70–99)
Potassium: 3.8 mEq/L (ref 3.5–5.1)
Sodium: 135 mEq/L (ref 135–145)

## 2012-03-15 MED ORDER — SIROLIMUS 1 MG PO TABS
2.0000 mg | ORAL_TABLET | ORAL | Status: DC
  Administered 2012-03-16: 2 mg via ORAL
  Filled 2012-03-15: qty 2

## 2012-03-15 MED ORDER — SIROLIMUS 1 MG PO TABS: 1.0000 mg | ORAL_TABLET | ORAL | Status: DC

## 2012-03-15 MED ORDER — FUROSEMIDE 80 MG PO TABS
80.0000 mg | ORAL_TABLET | Freq: Two times a day (BID) | ORAL | Status: DC
  Administered 2012-03-15 – 2012-03-16 (×3): 80 mg via ORAL
  Filled 2012-03-15 (×7): qty 1

## 2012-03-15 MED ORDER — SIROLIMUS 1 MG PO TABS
1.0000 mg | ORAL_TABLET | ORAL | Status: DC
  Filled 2012-03-15: qty 1

## 2012-03-15 MED ORDER — SIROLIMUS 1 MG PO TABS
1.0000 mg | ORAL_TABLET | Freq: Once | ORAL | Status: AC
  Administered 2012-03-15: 1 mg via ORAL
  Filled 2012-03-15: qty 1

## 2012-03-15 NOTE — Progress Notes (Signed)
Pharmacist Heart Failure Core Measure Documentation  Assessment: Chad [name] has an EF documented as 35% on 11/2011  by echo.  Rationale: Heart failure patients with left ventricular systolic dysfunction (LVSD) and an EF < 40% should be prescribed an angiotensin converting enzyme inhibitor (ACEI) or angiotensin receptor blocker (ARB) at discharge unless a contraindication is documented in the medical record.  This patient is not currently on an ACEI or ARB for HF.  This note is being placed in the record in order to provide documentation that a contraindication to the use of these agents is present for this encounter.  ACE Inhibitor or Angiotensin Receptor Blocker is contraindicated (specify all that apply)  []   ACEI allergy AND ARB allergy []   Angioedema []   Moderate or severe aortic stenosis []   Hyperkalemia []   Hypotension []   Renal artery stenosis [x]   Worsening renal function, preexisting renal disease or dysfunction   [name] 03/15/2012 10:20 AM

## 2012-03-15 NOTE — Progress Notes (Signed)
PATIENT DETAILS Name: Chad [name] Age: 57 y.o. Sex: male Date of Birth: 16-Sep-1955 Admit Date: 03/12/2012 Admitting Physician Inez Catalina, MD PCP:FOX,RICHARD F, MD  Subjective: No further diarrhea Abdominal pain resolved No further fever SOB better Pleasant and cooperative.  Assessment/Plan:  Fever -had abdominal pain/diarrhea with fever -Etiology unclear, ?viral gastroenteritis -UA and CXR neg for UTI and PNA respectively -Influenza PCR neg -C Diff PCR pending as he has not been able to produce a sample -DC contact precautions.  Acute on chronic Systolic heart failure -around 20 lbs weight gain and SOB as well. -cards consulted -We will restart lasix 80 BID today. -He is 2.6 L negative since admission. -not a candidate for ACEI/ARB's given CKD stage 4 -on Imdur/Hydralazine  CKD stage IV-s/p Renal Transplant -creatinine close to usual baseline of 3.5. -monitor lytes periodically -on prednisone and Sirolimus-level pending  Afibe/Aflutter -stable -on Amiodarone and Coreg -Reviewed outpatient primary cardiologist's note-not a coumadin candidate given prior h/o GI bleed  HTN -BP with moderate control -c/w Coreg, Hydralazine, Imdur  GERD -PPI  Disposition: -Will likely DC home in am.  DVT Prophylaxis: Prophylactic Heparin  Code Status: Full code  Procedures:  None  CONSULTS:  cardiology  PHYSICAL EXAM: Vital signs in last 24 hours: Filed Vitals:   03/14/12 1300 03/14/12 2106 03/15/12 0328 03/15/12 1116  BP: 130/81 136/78 136/82 142/86  Pulse: 77 68 64 66  Temp:  97.8 F (36.6 C) 98.2 F (36.8 C) 98.2 F (36.8 C)  TempSrc:  Oral Oral Oral  Resp: 20 18 20    Height:      Weight:   119.523 kg (263 lb 8 oz)   SpO2: 93% 95% 95%     Weight change: 0.723 kg (1 lb 9.5 oz) Body mass index is 34.29 kg/(m^2).   Gen Exam: Awake and alert with clear speech.   Neck: Supple, + JVD.   Chest: B/L Clear.   CVS: S1 S2 Regular, no murmurs.   Abdomen: soft, BS +, non tender, non distended.  Extremities: 2+ edema, lower extremities warm to touch. Neurologic: Non Focal.   Skin: No Rash.   Wounds: N/A.    Intake/Output from previous day:  Intake/Output Summary (Last 24 hours) at 03/15/12 1257 Last data filed at 03/15/12 0801  Gross per 24 hour  Intake   1200 ml  Output   1850 ml  Net   -650 ml     LAB RESULTS: CBC  Recent Labs Lab 03/12/12 0448 03/12/12 1222 03/13/12 0530 03/14/12 0610  WBC 15.5* 13.1* 16.9* 11.6*  HGB 11.7* 10.6* 11.1* 10.7*  HCT 36.7* 34.1* 35.0* 33.1*  PLT 129* 125* 141* 128*  MCV 88.6 89.0 88.4 87.3  MCH 28.3 27.7 28.0 28.2  MCHC 31.9 31.1 31.7 32.3  RDW 18.6* 18.5* 18.2* 18.1*  LYMPHSABS 0.8  --   --  1.7  MONOABS 1.6*  --   --  1.2*  EOSABS 0.2  --   --  0.1  BASOSABS 0.0  --   --  0.0    Chemistries   Recent Labs Lab 03/12/12 0448 03/12/12 1222 03/13/12 0530 03/14/12 0610 03/15/12 0440  NA 132*  --  136 134* 135  K 4.3  --  4.3 3.8 3.8  CL 96  --  100 97 99  CO2 23  --  23 20 23   GLUCOSE 137*  --  108* 117* 141*  BUN 76*  --  77* 85* 88*  CREATININE 3.51* 3.54* 3.54* 3.77* 3.77*  CALCIUM 8.9  --  9.3 8.9 8.6    CBG: No results found for this basename: GLUCAP,  in the last 168 hours  GFR Estimated Creatinine Clearance: 30.2 ml/min (by C-G formula based on Cr of 3.77).  Coagulation profile No results found for this basename: INR, PROTIME,  in the last 168 hours  Cardiac Enzymes No results found for this basename: CK, CKMB, TROPONINI, MYOGLOBIN,  in the last 168 hours  No components found with this basename: POCBNP,  No results found for this basename: DDIMER,  in the last 72 hours No results found for this basename: HGBA1C,  in the last 72 hours No results found for this basename: CHOL, HDL, LDLCALC, TRIG, CHOLHDL, LDLDIRECT,  in the last 72 hours No results found for this basename: TSH, T4TOTAL, FREET3, T3FREE, THYROIDAB,  in the last 72 hours No results  found for this basename: VITAMINB12, FOLATE, FERRITIN, TIBC, IRON, RETICCTPCT,  in the last 72 hours No results found for this basename: LIPASE, AMYLASE,  in the last 72 hours  Urine Studies No results found for this basename: UACOL, UAPR, USPG, UPH, UTP, UGL, UKET, UBIL, UHGB, UNIT, UROB, ULEU, UEPI, UWBC, URBC, UBAC, CAST, CRYS, UCOM, BILUA,  in the last 72 hours  MICROBIOLOGY: Recent Results (from the past 240 hour(s))  CULTURE, BLOOD (ROUTINE X 2)     Status: None   Collection Time    03/12/12  6:20 AM      Result Value Range Status   Specimen Description BLOOD LEFT ARM   Final   Special Requests BOTTLES DRAWN AEROBIC AND ANAEROBIC   Final   Culture  Setup Time 03/12/2012 15:20   Final   Culture     Final   Value:        BLOOD CULTURE RECEIVED NO GROWTH TO DATE CULTURE WILL BE HELD FOR 5 DAYS BEFORE ISSUING A FINAL NEGATIVE REPORT   Report Status PENDING   Incomplete  CULTURE, BLOOD (ROUTINE X 2)     Status: None   Collection Time    03/12/12  6:30 AM      Result Value Range Status   Specimen Description BLOOD RIGHT ARM   Final   Special Requests BOTTLES DRAWN AEROBIC ONLY 10CC   Final   Culture  Setup Time 03/12/2012 15:20   Final   Culture     Final   Value:        BLOOD CULTURE RECEIVED NO GROWTH TO DATE CULTURE WILL BE HELD FOR 5 DAYS BEFORE ISSUING A FINAL NEGATIVE REPORT   Report Status PENDING   Incomplete    RADIOLOGY STUDIES/RESULTS: Dg Chest 1 View  03/13/2012  *RADIOLOGY REPORT*  Clinical Data: Shortness of breath, cough and fever.  Evaluate for volume overload.  CHEST - 1 VIEW  Comparison: Chest x-ray 03/12/2012.  Findings: There is cephalization of the pulmonary vasculature and slight indistinctness of the interstitial markings suggestive of mild pulmonary edema.  No definite consolidative airspace disease. Mild bibasilar subsegmental atelectasis.  No pleural effusions. Heart size is mildly enlarged (unchanged). The patient is rotated to the right on today's  exam, resulting in distortion of the mediastinal contours and reduced diagnostic sensitivity and specificity for mediastinal pathology.  IMPRESSION: 1.  Mild cardiomegaly with evidence of mild interstitial pulmonary edema; findings suggestive of mild congestive heart failure.   Original Report Authenticated By: Trudie Reed, M.D.    Dg Chest 2 View  03/12/2012  *RADIOLOGY REPORT*  Clinical Data: Wheezing, cough and shortness  of breath.  CHEST - 2 VIEW  Comparison: Chest radiograph performed 12/09/2011  Findings: The lungs are mildly hypoexpanded.  Vascular congestion and vascular crowding are seen.  Mildly increased interstitial markings likely reflect mild interstitial edema, less prominent than on the prior study.  No pleural effusion or pneumothorax is seen.  The heart is mildly enlarged.  No acute osseous abnormalities are seen.  IMPRESSION: Lungs mildly hypoexpanded.  Vascular congestion and mild cardiomegaly, with mildly increased interstitial markings, likely reflecting mild interstitial edema.  This is less prominent than on the prior study.   Original Report Authenticated By: Tonia Ghent, M.D.     MEDICATIONS: Scheduled Meds: . amiodarone  200 mg Oral Daily  . aspirin EC  81 mg Oral Daily  . carvedilol  25 mg Oral BID WC  . furosemide  80 mg Oral BID  . heparin  5,000 Units Subcutaneous Q8H  . hydrALAZINE  75 mg Oral TID  . isosorbide mononitrate  60 mg Oral Daily  . pantoprazole  40 mg Oral Daily  . potassium chloride  10 mEq Oral Daily  . predniSONE  10 mg Oral Q breakfast  . simvastatin  10 mg Oral q1800  . sirolimus  1.5 mg Oral Daily  . sodium chloride  3 mL Intravenous Q12H   Continuous Infusions:  PRN Meds:.sodium chloride, acetaminophen, acetaminophen, alum & mag hydroxide-simeth, guaiFENesin-dextromethorphan, HYDROcodone-acetaminophen, ondansetron (ZOFRAN) IV, ondansetron, sodium chloride  Antibiotics: Anti-infectives   Start     Dose/Rate Route Frequency Ordered  Stop   03/12/12 1400  metroNIDAZOLE (FLAGYL) tablet 500 mg  Status:  Discontinued     500 mg Oral 3 times per day 03/12/12 1308 03/14/12 0913   03/12/12 1300  oseltamivir (TAMIFLU) capsule 75 mg  Status:  Discontinued     75 mg Oral 2 times daily 03/12/12 1113 03/12/12 1204   03/12/12 1300  ceFEPIme (MAXIPIME) 2 g in dextrose 5 % 50 mL IVPB  Status:  Discontinued     2 g 100 mL/hr over 30 Minutes Intravenous Every 24 hours 03/12/12 1154 03/14/12 0913       Chaya Jan, MD  [name] Pager:[phone]  If 7PM-7AM, please contact night-coverage [email or web address] Password [facility] 03/15/2012, 12:57 PM   LOS: 3 days

## 2012-03-15 NOTE — Progress Notes (Signed)
Patient ID: Chad [name], male   DOB: 1955-12-19, 57 y.o.   MRN: [id number]   SUBJECTIVE:  The patient is feeling relatively well. He does note that he has some edema. He's not having any shortness of breath.   Filed Vitals:   03/14/12 0944 03/14/12 1300 03/14/12 2106 03/15/12 0328  BP: 148/87 130/81 136/78 136/82  Pulse:  77 68 64  Temp:   97.8 F (36.6 C) 98.2 F (36.8 C)  TempSrc:   Oral Oral  Resp:  20 18 20   Height:      Weight:    263 lb 8 oz (119.523 kg)  SpO2:  93% 95% 95%    Intake/Output Summary (Last 24 hours) at 03/15/12 0846 Last data filed at 03/15/12 0801  Gross per 24 hour  Intake   1440 ml  Output   1850 ml  Net   -410 ml    LABS: Basic Metabolic Panel:  Recent Labs  62/95/28 0610 03/15/12 0440  NA 134* 135  K 3.8 3.8  CL 97 99  CO2 20 23  GLUCOSE 117* 141*  BUN 85* 88*  CREATININE 3.77* 3.77*  CALCIUM 8.9 8.6   Liver Function Tests: No results found for this basename: AST, ALT, ALKPHOS, BILITOT, PROT, ALBUMIN,  in the last 72 hours No results found for this basename: LIPASE, AMYLASE,  in the last 72 hours CBC:  Recent Labs  03/13/12 0530 03/14/12 0610  WBC 16.9* 11.6*  NEUTROABS  --  8.6*  HGB 11.1* 10.7*  HCT 35.0* 33.1*  MCV 88.4 87.3  PLT 141* 128*   Cardiac Enzymes: No results found for this basename: CKTOTAL, CKMB, CKMBINDEX, TROPONINI,  in the last 72 hours BNP: No components found with this basename: POCBNP,  D-Dimer: No results found for this basename: DDIMER,  in the last 72 hours Hemoglobin A1C: No results found for this basename: HGBA1C,  in the last 72 hours Fasting Lipid Panel: No results found for this basename: CHOL, HDL, LDLCALC, TRIG, CHOLHDL, LDLDIRECT,  in the last 72 hours Thyroid Function Tests: No results found for this basename: TSH, T4TOTAL, FREET3, T3FREE, THYROIDAB,  in the last 72 hours  RADIOLOGY: Dg Chest 1 View  03/13/2012  *RADIOLOGY REPORT*  Clinical Data: Shortness of breath, cough and  fever.  Evaluate for volume overload.  CHEST - 1 VIEW  Comparison: Chest x-ray 03/12/2012.  Findings: There is cephalization of the pulmonary vasculature and slight indistinctness of the interstitial markings suggestive of mild pulmonary edema.  No definite consolidative airspace disease. Mild bibasilar subsegmental atelectasis.  No pleural effusions. Heart size is mildly enlarged (unchanged). The patient is rotated to the right on today's exam, resulting in distortion of the mediastinal contours and reduced diagnostic sensitivity and specificity for mediastinal pathology.  IMPRESSION: 1.  Mild cardiomegaly with evidence of mild interstitial pulmonary edema; findings suggestive of mild congestive heart failure.   Original Report Authenticated By: Trudie Reed, M.D.    Dg Chest 2 View  03/12/2012  *RADIOLOGY REPORT*  Clinical Data: Wheezing, cough and shortness of breath.  CHEST - 2 VIEW  Comparison: Chest radiograph performed 12/09/2011  Findings: The lungs are mildly hypoexpanded.  Vascular congestion and vascular crowding are seen.  Mildly increased interstitial markings likely reflect mild interstitial edema, less prominent than on the prior study.  No pleural effusion or pneumothorax is seen.  The heart is mildly enlarged.  No acute osseous abnormalities are seen.  IMPRESSION: Lungs mildly hypoexpanded.  Vascular congestion and mild cardiomegaly,  with mildly increased interstitial markings, likely reflecting mild interstitial edema.  This is less prominent than on the prior study.   Original Report Authenticated By: Tonia Ghent, M.D.     PHYSICAL EXAM   Patient is overweight. He is oriented to person time and place. Affect is normal. There is no jugulovenous distention. Lungs reveal a few scattered rhonchi. Cardiac exam reveals S1 and S2. The abdomen is soft. The patient does have peripheral edema.   TELEMETRY:   I have reviewed telemetry today, March 15, 2012. There is normal sinus  rhythm.   ASSESSMENT AND PLAN:    Cardiomyopathy    The patient is receiving Imdur and hydralazine. He's also on carvedilol. No change in his meds in this regard.    Atrial flutter     He is maintaining sinus rhythm with amiodarone. The patient is not anticoagulated. This is because he's had GI bleeding in the past.    Acute on chronic systolic heart failure     The patient had some diuresis when he was admitted. Then he received no further diuretics. He received some diuretics yesterday with mild diuresis. The patient has edema. I believe he is volume overloaded at this time. He's been on varying doses of Lasix as an outpatient. For today I will restart 80 mg of by mouth Lasix twice a day.    CKD (chronic kidney disease)     Renal function has been stable during this admission. He is receiving his antirejection medications.   Willa Rough 03/15/2012 8:46 AM

## 2012-03-16 DIAGNOSIS — N179 Acute kidney failure, unspecified: Secondary | ICD-10-CM

## 2012-03-16 DIAGNOSIS — R197 Diarrhea, unspecified: Secondary | ICD-10-CM

## 2012-03-16 LAB — BASIC METABOLIC PANEL
CO2: 23 mEq/L (ref 19–32)
Chloride: 101 mEq/L (ref 96–112)
GFR calc non Af Amer: 16 mL/min — ABNORMAL LOW (ref >90)
Glucose, Bld: 111 mg/dL — ABNORMAL HIGH (ref 70–99)
Potassium: 4 mEq/L (ref 3.5–5.1)
Sodium: 137 mEq/L (ref 135–145)

## 2012-03-16 MED ORDER — FUROSEMIDE 80 MG PO TABS: 80.0000 mg | ORAL_TABLET | Freq: Two times a day (BID) | ORAL | Status: DC

## 2012-03-16 MED ORDER — HYDROCODONE-ACETAMINOPHEN 5-325 MG PO TABS: 1.0000 | ORAL_TABLET | ORAL | Status: DC | PRN

## 2012-03-16 MED ORDER — ISOSORBIDE MONONITRATE ER 60 MG PO TB24: 60.0000 mg | ORAL_TABLET | Freq: Every day | ORAL | Status: DC

## 2012-03-16 NOTE — Discharge Summary (Signed)
Physician Discharge Summary  Patient ID: Chad [name] MRN: [id number] DOB/AGE: Jun 05, 1955 57 y.o.  Admit date: 03/12/2012 Discharge date: 03/16/2012  Primary Care Physician:  [name], MD   Discharge Diagnoses:    Active Problems:   Cardiomyopathy   Atrial flutter   Acute on chronic systolic heart failure   CKD (chronic kidney disease)   Nausea and vomiting   Hx of amiodarone therapy   Steroid long-term use   Pulmonary hypertension   Diarrhea      Medication List    TAKE these medications       amiodarone 200 MG tablet  Commonly known as:  PACERONE  Take 200 mg by mouth daily.     aspirin EC 81 MG tablet  Take 81 mg by mouth daily.     carvedilol 25 MG tablet  Commonly known as:  COREG  Take 25 mg by mouth 2 (two) times daily with a meal.     furosemide 80 MG tablet  Commonly known as:  LASIX  Take 1 tablet (80 mg total) by mouth 2 (two) times daily.     hydrALAZINE 50 MG tablet  Commonly known as:  APRESOLINE  Take 75 mg by mouth 3 (three) times daily.     HYDROcodone-acetaminophen 5-325 MG per tablet  Commonly known as:  NORCO/VICODIN  Take 1-2 tablets by mouth every 4 (four) hours as needed.     isosorbide mononitrate 60 MG 24 hr tablet  Commonly known as:  IMDUR  Take 1 tablet (60 mg total) by mouth daily.     omeprazole 20 MG capsule  Commonly known as:  PRILOSEC  Take 20 mg by mouth daily.     potassium chloride 10 MEQ tablet  Commonly known as:  K-DUR,KLOR-CON  Take 10 mEq by mouth daily.     pravastatin 40 MG tablet  Commonly known as:  PRAVACHOL  Take 40 mg by mouth daily.     predniSONE 10 MG tablet  Commonly known as:  DELTASONE  Take 10 mg by mouth daily.     sirolimus 1 MG tablet  Commonly known as:  RAPAMUNE  Take 1.5 mg by mouth daily.         Disposition and Follow-up:  Will be discharged home today in stable and improved condition. Has been advised to follow up with his PCP in 2 weeks.  Consults:  Cardiology,  Dr. Myrtis Ser, Dr. Jens Som   Significant Diagnostic Studies:  No results found.  Brief H and P: For complete details please refer to admission H and P, but in brief patient is a 57yo man with a complicated PMH including CKD s/p a renal transplant in 1996, HTN, HCV, HLD, NICM, Aflutter who presented to the [facility] complaining of fever, cough, abdominal pain which started on Monday of this week (4 days prior to admission). He reports that since being in the hospital in December for bronchitis, he has had on again, off again sinus congestion, rhinorrhea and cough. The symptoms started again on Monday. He also reports that he has suprapubic/periumbilical abdominal pain and associated diarrhea, about 4 X per day. He describes the diarrhea as brown, watery and without blood. He has no sick contacts. He did have antibiotics in December for bronchitis. Associated symptoms include decreased PO intake, including fluids. He did note that his urine was more concentrated, but without dysuria. He specifically denied myalgias, headache, change in vision, chest pain, dysphagia, nausea, vomiting, edema of his legs, pain anywhere. We were asked  to admit him for further evaluation and management.     Hospital Course:  Active Problems:   Cardiomyopathy   Atrial flutter   Acute on chronic systolic heart failure   CKD (chronic kidney disease)   Nausea and vomiting   Hx of amiodarone therapy   Steroid long-term use   Pulmonary hypertension   Diarrhea   Fever  -had abdominal pain/diarrhea with fever; but resolved quickly within 24 hours. -Etiology unclear, ?viral gastroenteritis  -UA and CXR neg for UTI and PNA respectively  -Influenza PCR neg  -C Diff PCR pending as he has not been able to produce a sample   Acute on chronic Systolic heart failure  -around 20 lbs weight gain and SOB as well.  -cards was consulted  -Will be continued on lasix 80 mg BID at home.  -He is 3 L negative since admission.  -not a  candidate for ACEI/ARB's given CKD stage 4  -on Imdur/Hydralazine   CKD stage IV-s/p Renal Transplant  -creatinine close to usual baseline of 3.5. (3.7 at time of DC) -Has a follow up with Dr. Caryn Section in 10 days. -At that time I would recommend rechecking renal function. -Continue his anti-rejection meds: prednisone and Sirolimus  Afibe/Aflutter  -stable  -on Amiodarone and Coreg  -Reviewed outpatient primary cardiologist's note-not a coumadin candidate given prior h/o GI bleed   HTN  -BP with moderate control (140s/80s). -c/w Coreg, Hydralazine, Imdur   GERD  -PPI    Time spent on Discharge: Greater than 30 minutes.  SignedChaya Jan [name] Pager: [phone] 03/16/2012, 1:17 PM

## 2012-03-16 NOTE — Progress Notes (Signed)
Patient ID: Chad [name], male   DOB: April 05, 1955, 57 y.o.   MRN: [id number]   SUBJECTIVE:  No dyspnea or chest pain  Filed Vitals:   03/15/12 1845 03/15/12 2223 03/16/12 0003 03/16/12 0511  BP: 133/82  120/73 144/87  Pulse:  70  68  Temp:  98.2 F (36.8 C)  98.1 F (36.7 C)  TempSrc:  Oral  Oral  Resp:  18  18  Height:      Weight:    265 lb 14.4 oz (120.611 kg)  SpO2:  95%  94%    Intake/Output Summary (Last 24 hours) at 03/16/12 0643 Last data filed at 03/16/12 0618  Gross per 24 hour  Intake    960 ml  Output   1575 ml  Net   -615 ml    LABS: Basic Metabolic Panel:  Recent Labs  40/98/11 0610 03/15/12 0440  NA 134* 135  K 3.8 3.8  CL 97 99  CO2 20 23  GLUCOSE 117* 141*  BUN 85* 88*  CREATININE 3.77* 3.77*  CALCIUM 8.9 8.6   CBC:  Recent Labs  03/14/12 0610  WBC 11.6*  NEUTROABS 8.6*  HGB 10.7*  HCT 33.1*  MCV 87.3  PLT 128*    RADIOLOGY: Dg Chest 1 View  03/13/2012  *RADIOLOGY REPORT*  Clinical Data: Shortness of breath, cough and fever.  Evaluate for volume overload.  CHEST - 1 VIEW  Comparison: Chest x-ray 03/12/2012.  Findings: There is cephalization of the pulmonary vasculature and slight indistinctness of the interstitial markings suggestive of mild pulmonary edema.  No definite consolidative airspace disease. Mild bibasilar subsegmental atelectasis.  No pleural effusions. Heart size is mildly enlarged (unchanged). The patient is rotated to the right on today's exam, resulting in distortion of the mediastinal contours and reduced diagnostic sensitivity and specificity for mediastinal pathology.  IMPRESSION: 1.  Mild cardiomegaly with evidence of mild interstitial pulmonary edema; findings suggestive of mild congestive heart failure.   Original Report Authenticated By: Trudie Reed, M.D.    Dg Chest 2 View  03/12/2012  *RADIOLOGY REPORT*  Clinical Data: Wheezing, cough and shortness of breath.  CHEST - 2 VIEW  Comparison: Chest radiograph  performed 12/09/2011  Findings: The lungs are mildly hypoexpanded.  Vascular congestion and vascular crowding are seen.  Mildly increased interstitial markings likely reflect mild interstitial edema, less prominent than on the prior study.  No pleural effusion or pneumothorax is seen.  The heart is mildly enlarged.  No acute osseous abnormalities are seen.  IMPRESSION: Lungs mildly hypoexpanded.  Vascular congestion and mild cardiomegaly, with mildly increased interstitial markings, likely reflecting mild interstitial edema.  This is less prominent than on the prior study.   Original Report Authenticated By: Tonia Ghent, M.D.     PHYSICAL EXAM   Patient is overweight. He is oriented to person time and place. Affect is normal. There is no jugulovenous distention. Lungs reveal mild exp wheeze. Cardiac exam reveals S1 and S2. The abdomen is soft. Ext show chronic skin changes and 1 +edema   TELEMETRY:   Telemetry - sinus   ASSESSMENT AND PLAN:    Cardiomyopathy    Continue beta blocker, hydralazine and nitrates; no ACEI due to renal insufficiency    Atrial flutter     He is maintaining sinus rhythm with amiodarone. The patient is not anticoagulated because of previous GI bleed. Continue ASA.    Acute on chronic systolic heart failure     Continue present dose of lasix at  DC; will need FU BMET one week after DC with results to Dr Shirlee Latch.     CKD (chronic kidney disease)     Follow renal function following DC Other issues per primary care  Patient should fu with Dr Shirlee Latch one to two weeks following DC. Please call with questions.   Olga Millers 03/16/2012 6:43 AM

## 2012-03-18 LAB — CULTURE, BLOOD (ROUTINE X 2): Culture: NO GROWTH

## 2012-03-28 ENCOUNTER — Other Ambulatory Visit: Payer: Self-pay | Admitting: Cardiology

## 2012-07-09 ENCOUNTER — Inpatient Hospital Stay (HOSPITAL_COMMUNITY)
Admission: EM | Admit: 2012-07-09 | Discharge: 2012-07-15 | DRG: 481 | Disposition: A | Discharge status: Rehabilitation Facility | Payer: Medicaid Other | Attending: Internal Medicine | Admitting: Internal Medicine

## 2012-07-09 ENCOUNTER — Encounter (HOSPITAL_COMMUNITY): Payer: Self-pay | Admitting: Family Medicine

## 2012-07-09 ENCOUNTER — Emergency Department (HOSPITAL_COMMUNITY): Payer: Medicaid Other

## 2012-07-09 DIAGNOSIS — I480 Paroxysmal atrial fibrillation: Secondary | ICD-10-CM | POA: Diagnosis present

## 2012-07-09 DIAGNOSIS — Z96649 Presence of unspecified artificial hip joint: Secondary | ICD-10-CM

## 2012-07-09 DIAGNOSIS — IMO0002 Reserved for concepts with insufficient information to code with codable children: Secondary | ICD-10-CM

## 2012-07-09 DIAGNOSIS — T8610 Unspecified complication of kidney transplant: Secondary | ICD-10-CM

## 2012-07-09 DIAGNOSIS — I5023 Acute on chronic systolic (congestive) heart failure: Secondary | ICD-10-CM

## 2012-07-09 DIAGNOSIS — Y92009 Unspecified place in unspecified non-institutional (private) residence as the place of occurrence of the external cause: Secondary | ICD-10-CM

## 2012-07-09 DIAGNOSIS — D62 Acute posthemorrhagic anemia: Secondary | ICD-10-CM | POA: Diagnosis not present

## 2012-07-09 DIAGNOSIS — S72402A Unspecified fracture of lower end of left femur, initial encounter for closed fracture: Secondary | ICD-10-CM

## 2012-07-09 DIAGNOSIS — I509 Heart failure, unspecified: Secondary | ICD-10-CM | POA: Diagnosis present

## 2012-07-09 DIAGNOSIS — N189 Chronic kidney disease, unspecified: Secondary | ICD-10-CM | POA: Diagnosis present

## 2012-07-09 DIAGNOSIS — D638 Anemia in other chronic diseases classified elsewhere: Secondary | ICD-10-CM | POA: Diagnosis present

## 2012-07-09 DIAGNOSIS — M84469A Pathological fracture, unspecified tibia and fibula, initial encounter for fracture: Secondary | ICD-10-CM | POA: Diagnosis present

## 2012-07-09 DIAGNOSIS — R7309 Other abnormal glucose: Secondary | ICD-10-CM | POA: Diagnosis present

## 2012-07-09 DIAGNOSIS — Z905 Acquired absence of kidney: Secondary | ICD-10-CM

## 2012-07-09 DIAGNOSIS — J9600 Acute respiratory failure, unspecified whether with hypoxia or hypercapnia: Secondary | ICD-10-CM

## 2012-07-09 DIAGNOSIS — W1809XA Striking against other object with subsequent fall, initial encounter: Secondary | ICD-10-CM | POA: Diagnosis present

## 2012-07-09 DIAGNOSIS — I10 Essential (primary) hypertension: Secondary | ICD-10-CM

## 2012-07-09 DIAGNOSIS — I428 Other cardiomyopathies: Secondary | ICD-10-CM | POA: Diagnosis present

## 2012-07-09 DIAGNOSIS — E785 Hyperlipidemia, unspecified: Secondary | ICD-10-CM | POA: Diagnosis present

## 2012-07-09 DIAGNOSIS — W010XXA Fall on same level from slipping, tripping and stumbling without subsequent striking against object, initial encounter: Secondary | ICD-10-CM | POA: Diagnosis present

## 2012-07-09 DIAGNOSIS — N184 Chronic kidney disease, stage 4 (severe): Secondary | ICD-10-CM

## 2012-07-09 DIAGNOSIS — Z79899 Other long term (current) drug therapy: Secondary | ICD-10-CM

## 2012-07-09 DIAGNOSIS — S72453A Displaced supracondylar fracture without intracondylar extension of lower end of unspecified femur, initial encounter for closed fracture: Principal | ICD-10-CM | POA: Diagnosis present

## 2012-07-09 DIAGNOSIS — N179 Acute kidney failure, unspecified: Secondary | ICD-10-CM

## 2012-07-09 DIAGNOSIS — I5022 Chronic systolic (congestive) heart failure: Secondary | ICD-10-CM | POA: Diagnosis present

## 2012-07-09 DIAGNOSIS — S7292XA Unspecified fracture of left femur, initial encounter for closed fracture: Secondary | ICD-10-CM

## 2012-07-09 DIAGNOSIS — J189 Pneumonia, unspecified organism: Secondary | ICD-10-CM

## 2012-07-09 DIAGNOSIS — I420 Dilated cardiomyopathy: Secondary | ICD-10-CM | POA: Diagnosis present

## 2012-07-09 DIAGNOSIS — I129 Hypertensive chronic kidney disease with stage 1 through stage 4 chronic kidney disease, or unspecified chronic kidney disease: Secondary | ICD-10-CM | POA: Diagnosis present

## 2012-07-09 DIAGNOSIS — B1920 Unspecified viral hepatitis C without hepatic coma: Secondary | ICD-10-CM | POA: Diagnosis present

## 2012-07-09 DIAGNOSIS — I4891 Unspecified atrial fibrillation: Secondary | ICD-10-CM | POA: Diagnosis present

## 2012-07-09 DIAGNOSIS — I429 Cardiomyopathy, unspecified: Secondary | ICD-10-CM

## 2012-07-09 HISTORY — PX: FEMUR FRACTURE SURGERY: SHX633

## 2012-07-09 LAB — CBC WITH DIFFERENTIAL/PLATELET
Eosinophils Absolute: 0 10*3/uL (ref 0.0–0.7)
Hemoglobin: 11.4 g/dL — ABNORMAL LOW (ref 13.0–17.0)
Lymphs Abs: 1 10*3/uL (ref 0.7–4.0)
MCH: 28.9 pg (ref 26.0–34.0)
Monocytes Relative: 8 % (ref 3–12)
Neutrophils Relative %: 84 % — ABNORMAL HIGH (ref 43–77)
RBC: 3.95 MIL/uL — ABNORMAL LOW (ref 4.22–5.81)

## 2012-07-09 MED ORDER — HYDROMORPHONE HCL PF 1 MG/ML IJ SOLN
1.0000 mg | Freq: Once | INTRAMUSCULAR | Status: AC
  Administered 2012-07-10: 1 mg via INTRAVENOUS
  Filled 2012-07-09: qty 1

## 2012-07-09 MED ORDER — MORPHINE SULFATE 4 MG/ML IJ SOLN
4.0000 mg | Freq: Once | INTRAMUSCULAR | Status: AC
  Administered 2012-07-09: 4 mg via INTRAVENOUS
  Filled 2012-07-09: qty 1

## 2012-07-09 NOTE — ED Provider Notes (Signed)
History     CSN: [id number]  Arrival date & time 07/09/12  2149   First MD Initiated Contact with Patient 07/09/12 2202      Chief Complaint  Patient presents with  . Knee Pain    (Consider location/radiation/quality/duration/timing/severity/associated sxs/prior treatment) Patient is a 57 y.o. male presenting with knee pain. The history is provided by the patient.  Knee Pain Location:  Knee Injury: yes   Mechanism of injury: fall   Fall:    Fall occurred:  Standing (slipped on floor at home)   Height of fall:  From standing   Impact surface:  Hard floor   Point of impact:  Knees   Entrapped after fall: no   Knee location:  L knee Pain details:    Quality:  Aching   Radiates to:  Does not radiate   Severity:  Moderate   Onset quality:  Sudden   Timing:  Constant   Progression:  Unchanged Chronicity:  New Relieved by:  Nothing Worsened by:  Nothing tried Ineffective treatments:  None tried Associated symptoms: decreased ROM   Associated symptoms: no fever, no numbness and no tingling     Past Medical History  Diagnosis Date  . Hypertension   . Hepatitis C 1996  . CKD (chronic kidney disease), stage IV     a. s/p renal transplant 1996.  Marland Kitchen Pneumonia   . HLD (hyperlipidemia)   . Nonischemic cardiomyopathy     a. unknown etiology, EF 30-35% by echo 09/2011;  b. 09/2011 Lexi MV EF 42%, no ischemia/infarct.  . Systolic CHF   . Atrial flutter     a. in the setting of sepsis 05/2011; on amio; no anticoagulation    Past Surgical History  Procedure Laterality Date  . Nephrectomy transplanted organ    . Insertion of dialysis catheter  06/20/2011    Procedure: INSERTION OF DIALYSIS CATHETER;  Surgeon: [name], MD;  Location: [facility] OR;  Service: Vascular;  Laterality: Right;  Ultrasound guided insertion of right internal jugular dialysis catheter  . Multiple extractions with alveoloplasty  06/27/2011    Procedure: MULTIPLE EXTRACION WITH ALVEOLOPLASTY;  Surgeon: [name], DDS;  Location: [facility] OR;  Service: Oral Surgery;  Laterality: N/A;  Extraction  of tooth # 14 with alveoloplasty  . Hip surgery    . Joint replacement      left hip    Family History  Problem Relation Age of Onset  . Heart disease Neg Hx     History  Substance Use Topics  . Smoking status: Never Smoker   . Smokeless tobacco: Never Used  . Alcohol Use: No      Review of Systems  Constitutional: Negative for fever and chills.  Respiratory: Negative for chest tightness and shortness of breath.   Cardiovascular: Positive for leg swelling (chronic, unchanged. ). Negative for chest pain.  Gastrointestinal: Negative for nausea, vomiting, abdominal pain, diarrhea and constipation.  Genitourinary: Negative for dysuria.  Neurological: Negative for dizziness, weakness and headaches.  All other systems reviewed and are negative.    Allergies  Review of patient's allergies indicates no known allergies.  Home Medications   Current Outpatient Rx  Name  Route  Sig  Dispense  Refill  . amiodarone (PACERONE) 200 MG tablet   Oral   Take 200 mg by mouth daily.         Marland Kitchen aspirin EC 81 MG tablet   Oral   Take 81 mg by mouth daily.         Marland Kitchen  carvedilol (COREG) 25 MG tablet   Oral   Take 25 mg by mouth 2 (two) times daily with a meal.         . furosemide (LASIX) 80 MG tablet   Oral   Take 1 tablet (80 mg total) by mouth 2 (two) times daily.   60 tablet   2   . hydrALAZINE (APRESOLINE) 50 MG tablet   Oral   Take 75 mg by mouth 3 (three) times daily.         Marland Kitchen HYDROcodone-acetaminophen (NORCO/VICODIN) 5-325 MG per tablet   Oral   Take 1-2 tablets by mouth every 4 (four) hours as needed.   30 tablet   0   . isosorbide mononitrate (IMDUR) 60 MG 24 hr tablet   Oral   Take 1 tablet (60 mg total) by mouth daily.   30 tablet   2   . omeprazole (PRILOSEC) 20 MG capsule   Oral   Take 20 mg by mouth daily.          . potassium chloride (K-DUR,KLOR-CON) 10  MEQ tablet   Oral   Take 10 mEq by mouth daily.         . pravastatin (PRAVACHOL) 40 MG tablet   Oral   Take 40 mg by mouth daily.         . pravastatin (PRAVACHOL) 40 MG tablet      TAKE 1 TABLET (40 MG TOTAL) BY MOUTH EVERY EVENING.   90 tablet   0     Patient needs to schedule follow up   . predniSONE (DELTASONE) 10 MG tablet   Oral   Take 10 mg by mouth daily.         . sirolimus (RAPAMUNE) 1 MG tablet   Oral   Take 1.5 mg by mouth daily.           SpO2 95%  Physical Exam  Nursing note and vitals reviewed. Constitutional: He is oriented to person, place, and time. He appears well-developed and well-nourished. No distress.  HENT:  Head: Normocephalic and atraumatic.  Right Ear: External ear normal.  Left Ear: External ear normal.  Mouth/Throat: Oropharynx is clear and moist.  Eyes: Pupils are equal, round, and reactive to light.  Neck: Normal range of motion. Neck supple.  Cardiovascular: Normal rate, regular rhythm, normal heart sounds and intact distal pulses.  Exam reveals no gallop and no friction rub.   No murmur heard. Pulmonary/Chest: Effort normal and breath sounds normal. No respiratory distress. He has no wheezes. He has no rales.  Abdominal: Soft. There is no tenderness. There is no rebound and no guarding.  Musculoskeletal: Normal range of motion. He exhibits edema (3+ edema BLEs. ). He exhibits no tenderness.  No significant deformity of left knee noted, however exam difficult due to body habitus. TTP over patellar region and superior to knee. Limited ROM with knee flexion. Unable to lift leg off bed due to pain.   Lymphadenopathy:    He has no cervical adenopathy.  Neurological: He is alert and oriented to person, place, and time.  Skin: Skin is warm and dry. No rash noted. No erythema.  Psychiatric: He has a normal mood and affect. His behavior is normal.    ED Course  Procedures (including critical care time)  Labs Reviewed  CBC WITH  DIFFERENTIAL - Abnormal; Notable for the following:    WBC 12.6 (*)    RBC 3.95 (*)    Hemoglobin 11.4 (*)  HCT 34.8 (*)    RDW 17.8 (*)    Platelets 140 (*)    Neutrophils Relative % 84 (*)    Neutro Abs 10.6 (*)    Lymphocytes Relative 8 (*)    All other components within normal limits  BASIC METABOLIC PANEL - Abnormal; Notable for the following:    Glucose, Bld 170 (*)    BUN 92 (*)    Creatinine, Ser 4.17 (*)    GFR calc non Af Amer 15 (*)    GFR calc Af Amer 17 (*)    All other components within normal limits   Dg Pelvis 1-2 Views  07/09/2012   *RADIOLOGY REPORT*  Clinical Data: Left pelvic pain following a fall tonight.  PELVIS - 1-2 VIEW  Comparison: Abdomen and pelvis CT dated 08/09/2011.  Findings: Again demonstrated is a left total hip prosthesis with thinning and deformity of the acetabulum.  No fracture or dislocation seen.  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  No acute abnormality.   Original Report Authenticated By: Beckie Salts, M.D.   Dg Femur Left  07/09/2012   *RADIOLOGY REPORT*  Clinical Data: Left leg pain following a fall tonight.  LEFT FEMUR - 2 VIEW  Comparison: Left knee radiographs and CT dated 03/30/2009 and 03/31/2009 respectively.  Pelvis radiograph obtained today.  Findings: Left total hip prosthesis with the previously described chronic acetabular thinning and deformity.  Mildly impacted comminuted fracture of the distal femoral metaphysis without significant displacement or angulation.  Possible fracture of the posterior aspect of one of the femoral condyles, proximally. Diffuse soft tissue swelling at the level of the knee, most pronounced anteriorly.  Small knee joint effusion.  IMPRESSION:  1.  Comminuted, mildly impacted fracture of the distal femoral metaphysis and a possible fracture of the posterior aspect of one of the femoral condyles, proximally. 2.  Small effusion.   Original Report Authenticated  By: Beckie Salts, M.D.   Dg Knee 1-2 Views Left  07/09/2012   *RADIOLOGY REPORT*  Clinical Data: Pain post fall.  LEFT KNEE - 1-2 VIEW  Comparison: 03/30/2009  Findings: Transverse comminuted fracture across the distal femoral metadiaphyseal region, impacted with several small fracture fragments.  No definite involvement of the femoral condyles on these two radiographs.  Probable depressed fracture of the lateral tibial plateau.  IMPRESSION: 1. Transverse comminuted fracture, distal femur. 2.  Probable depressed lateral tibial plateau fracture.   Original Report Authenticated By: D. Andria Rhein, MD   Ct Knee Left Wo Contrast  07/10/2012   *RADIOLOGY REPORT*  Clinical Data: Pain post fall.  CT OF THE LEFT KNEE WITHOUT CONTRAST  Technique:  Multidetector CT imaging was performed according to the standard protocol. Multiplanar CT image reconstructions were also generated.  Comparison: Radiographs from the previous day, and earlier studies  Findings: Comminuted fracture of the distal femoral metadiaphyseal region extending to involve the anterior subchondral cortex of both femoral condyles, with a fracture   component in the sagittal plane extending to the intercondylar notch.  There is impaction of the proximal shaft onto the fracture fragments, with anterior angulation of the distal fracture fragment complex.  There is an old avulsion fracture deformity of the posterior proximal tibia at the level of the PCL.  No new tibial plateau fracture is evident. Hemarthrosis is evident as well as regional soft tissue swelling.  IMPRESSION: 1.  Comminuted intra-articular distal femur fracture as above. 2.  Chronic fracture deformity of the posterior tibia  at the PCL insertion.   Original Report Authenticated By: D. Andria Rhein, MD     1. Fall against object, initial encounter   2. Femur fracture, left, closed, initial encounter       MDM  73:41 PM 57 year old male with history of hypertension, chronic kidney  disease and hyperlipidemia presenting from home with a fall from standing after slipping on his floor and landing on his left knee. He endorses pain in his left knee just above his patella and mild left hip pain. He is status post left hip arthroplasty performed at [facility] in 1997. No deformity noted, but exam is difficult to 2 tablets and edema. We'll check plain films of the knee, femur and hip and treat pain.  1:13 AM plain film shows left distal femur fracture. Discussed with ortho with recs to obtain CT which was done and place in knee immobilizer. Given chronic medical conditions request pt be admitted to medicine service. Basic labs checked with no significant abnormality, creatinine at baseline. Pt admitted for further management.       Caren Hazy, MD 07/10/12 [phone]

## 2012-07-09 NOTE — ED Notes (Addendum)
Pt slipped and fell today on a kitchen floor. No LOC, did not hit head, and is not anticoagulated. Pt c/o left knee pain only, edema noted to left knee, not obvious deformity noted PMS intact. Pain worse on extension. Pt had left hip replacement 10 years ago and injured left knee four years ago. Pt not ambulatory on scene

## 2012-07-10 ENCOUNTER — Emergency Department (HOSPITAL_COMMUNITY): Payer: Medicaid Other

## 2012-07-10 ENCOUNTER — Inpatient Hospital Stay (HOSPITAL_COMMUNITY): Payer: Medicaid Other | Admitting: Anesthesiology

## 2012-07-10 ENCOUNTER — Inpatient Hospital Stay (HOSPITAL_COMMUNITY): Payer: Medicaid Other

## 2012-07-10 ENCOUNTER — Encounter (HOSPITAL_COMMUNITY): Payer: Self-pay | Admitting: Anesthesiology

## 2012-07-10 ENCOUNTER — Encounter (HOSPITAL_COMMUNITY)
Admission: EM | Disposition: A | Discharge status: Rehabilitation Facility | Payer: Self-pay | Source: Home / Self Care | Attending: Internal Medicine

## 2012-07-10 DIAGNOSIS — S72409A Unspecified fracture of lower end of unspecified femur, initial encounter for closed fracture: Secondary | ICD-10-CM

## 2012-07-10 DIAGNOSIS — I509 Heart failure, unspecified: Secondary | ICD-10-CM

## 2012-07-10 DIAGNOSIS — I428 Other cardiomyopathies: Secondary | ICD-10-CM

## 2012-07-10 DIAGNOSIS — I5022 Chronic systolic (congestive) heart failure: Secondary | ICD-10-CM

## 2012-07-10 DIAGNOSIS — N184 Chronic kidney disease, stage 4 (severe): Secondary | ICD-10-CM

## 2012-07-10 DIAGNOSIS — W1809XA Striking against other object with subsequent fall, initial encounter: Secondary | ICD-10-CM

## 2012-07-10 DIAGNOSIS — I10 Essential (primary) hypertension: Secondary | ICD-10-CM

## 2012-07-10 DIAGNOSIS — I4891 Unspecified atrial fibrillation: Secondary | ICD-10-CM

## 2012-07-10 DIAGNOSIS — T8610 Unspecified complication of kidney transplant: Secondary | ICD-10-CM

## 2012-07-10 HISTORY — PX: FEMUR IM NAIL: SHX1597

## 2012-07-10 LAB — GLUCOSE, CAPILLARY
Glucose-Capillary: 115 mg/dL — ABNORMAL HIGH (ref 70–99)
Glucose-Capillary: 110 mg/dL — ABNORMAL HIGH (ref 70–99)

## 2012-07-10 LAB — BASIC METABOLIC PANEL
BUN: 92 mg/dL — ABNORMAL HIGH (ref 6–23)
CO2: 24 mEq/L (ref 19–32)
Chloride: 97 mEq/L (ref 96–112)
Chloride: 99 mEq/L (ref 96–112)
GFR calc Af Amer: 18 mL/min — ABNORMAL LOW (ref >90)
GFR calc non Af Amer: 15 mL/min — ABNORMAL LOW (ref >90)
Glucose, Bld: 170 mg/dL — ABNORMAL HIGH (ref 70–99)
Potassium: 3.6 mEq/L (ref 3.5–5.1)
Sodium: 137 mEq/L (ref 135–145)

## 2012-07-10 LAB — SURGICAL PCR SCREEN
MRSA, PCR: NEGATIVE
Staphylococcus aureus: NEGATIVE

## 2012-07-10 LAB — IRON AND TIBC
Iron: 39 ug/dL — ABNORMAL LOW (ref 42–135)
TIBC: 235 ug/dL (ref 215–435)
UIBC: 196 ug/dL (ref 125–400)

## 2012-07-10 LAB — FERRITIN: Ferritin: 121 ng/mL (ref 22–322)

## 2012-07-10 LAB — CBC
HCT: 35 % — ABNORMAL LOW (ref 39.0–52.0)
MCH: 28.8 pg (ref 26.0–34.0)
MCHC: 32.4 g/dL (ref 30.0–36.0)
MCV: 87.9 fL (ref 78.0–100.0)
MCV: 89 fL (ref 78.0–100.0)
Platelets: 126 10*3/uL — ABNORMAL LOW (ref 150–400)
Platelets: 114 10*3/uL — ABNORMAL LOW (ref 150–400)
RBC: 3.98 MIL/uL — ABNORMAL LOW (ref 4.22–5.81)
RBC: 3.26 MIL/uL — ABNORMAL LOW (ref 4.22–5.81)
WBC: 15.2 10*3/uL — ABNORMAL HIGH (ref 4.0–10.5)

## 2012-07-10 LAB — FOLATE: Folate: >20 ng/mL

## 2012-07-10 LAB — CREATININE, SERUM: Creatinine, Ser: 4.15 mg/dL — ABNORMAL HIGH (ref 0.50–1.35)

## 2012-07-10 LAB — HEMOGLOBIN A1C: Hgb A1c MFr Bld: 5.7 % — ABNORMAL HIGH (ref <5.7)

## 2012-07-10 SURGERY — INSERTION, INTRAMEDULLARY ROD, FEMUR
Anesthesia: General | Site: Thigh | Laterality: Left | Wound class: Clean

## 2012-07-10 MED ORDER — HYDROMORPHONE HCL PF 1 MG/ML IJ SOLN
0.2500 mg | INTRAMUSCULAR | Status: DC | PRN
  Administered 2012-07-10: 0.5 mg via INTRAVENOUS

## 2012-07-10 MED ORDER — ONDANSETRON HCL 4 MG/2ML IJ SOLN
4.0000 mg | Freq: Four times a day (QID) | INTRAMUSCULAR | Status: DC | PRN
  Administered 2012-07-11: 4 mg via INTRAVENOUS
  Filled 2012-07-10: qty 2

## 2012-07-10 MED ORDER — 0.9 % SODIUM CHLORIDE (POUR BTL) OPTIME
TOPICAL | Status: DC | PRN
  Administered 2012-07-10: 1000 mL

## 2012-07-10 MED ORDER — SIROLIMUS 1 MG/ML PO SOLN
1.2000 mg | Freq: Every day | ORAL | Status: DC
  Filled 2012-07-10 (×10): qty 1.2

## 2012-07-10 MED ORDER — LACTATED RINGERS IV SOLN
INTRAVENOUS | Status: DC | PRN
  Administered 2012-07-10: 10:00:00 via INTRAVENOUS

## 2012-07-10 MED ORDER — HYDROMORPHONE HCL PF 1 MG/ML IJ SOLN
0.5000 mg | INTRAMUSCULAR | Status: DC | PRN
  Administered 2012-07-10 (×2): 1 mg via INTRAVENOUS
  Filled 2012-07-10 (×2): qty 1

## 2012-07-10 MED ORDER — OXYCODONE HCL 5 MG PO TABS
5.0000 mg | ORAL_TABLET | ORAL | Status: DC | PRN
  Administered 2012-07-10: 5 mg via ORAL
  Filled 2012-07-10: qty 1

## 2012-07-10 MED ORDER — SIROLIMUS 1 MG/ML PO SOLN
1.2000 mg | Freq: Every day | ORAL | Status: DC
  Administered 2012-07-11 – 2012-07-15 (×5): 1.2 mg via ORAL
  Filled 2012-07-10 (×2): qty 1.2

## 2012-07-10 MED ORDER — ONDANSETRON HCL 4 MG PO TABS: 4.0000 mg | ORAL_TABLET | Freq: Four times a day (QID) | ORAL | Status: DC | PRN

## 2012-07-10 MED ORDER — PROPOFOL 10 MG/ML IV BOLUS
INTRAVENOUS | Status: DC | PRN
  Administered 2012-07-10: 200 mg via INTRAVENOUS
  Administered 2012-07-10: 30 mg via INTRAVENOUS
  Administered 2012-07-10: 20 mg via INTRAVENOUS

## 2012-07-10 MED ORDER — DOCUSATE SODIUM 100 MG PO CAPS
100.0000 mg | ORAL_CAPSULE | Freq: Two times a day (BID) | ORAL | Status: DC
  Administered 2012-07-10 – 2012-07-15 (×11): 100 mg via ORAL
  Filled 2012-07-10 (×12): qty 1

## 2012-07-10 MED ORDER — ACETAMINOPHEN 650 MG RE SUPP: 650.0000 mg | Freq: Four times a day (QID) | RECTAL | Status: DC | PRN

## 2012-07-10 MED ORDER — HYDROMORPHONE HCL PF 1 MG/ML IJ SOLN
1.0000 mg | Freq: Once | INTRAMUSCULAR | Status: AC
  Administered 2012-07-10: 1 mg via INTRAVENOUS
  Filled 2012-07-10: qty 1

## 2012-07-10 MED ORDER — HYDROMORPHONE HCL PF 1 MG/ML IJ SOLN
0.5000 mg | INTRAMUSCULAR | Status: DC | PRN
  Administered 2012-07-10 – 2012-07-11 (×5): 1 mg via INTRAVENOUS
  Filled 2012-07-10 (×5): qty 1

## 2012-07-10 MED ORDER — SODIUM CHLORIDE 0.9 % IV SOLN
INTRAVENOUS | Status: DC
  Administered 2012-07-11 – 2012-07-14 (×2): via INTRAVENOUS

## 2012-07-10 MED ORDER — PHENYLEPHRINE HCL 10 MG/ML IJ SOLN
10.0000 mg | INTRAVENOUS | Status: DC | PRN
  Administered 2012-07-10: 40 ug/min via INTRAVENOUS

## 2012-07-10 MED ORDER — OXYCODONE HCL 5 MG PO TABS
5.0000 mg | ORAL_TABLET | ORAL | Status: DC | PRN
  Administered 2012-07-10 – 2012-07-15 (×9): 10 mg via ORAL
  Filled 2012-07-10 (×8): qty 2

## 2012-07-10 MED ORDER — METOPROLOL TARTRATE 1 MG/ML IV SOLN
2.5000 mg | Freq: Four times a day (QID) | INTRAVENOUS | Status: DC
  Administered 2012-07-10 – 2012-07-11 (×4): 2.5 mg via INTRAVENOUS
  Filled 2012-07-10 (×9): qty 5

## 2012-07-10 MED ORDER — DEXTROSE 5 % IV SOLN
3.0000 g | INTRAVENOUS | Status: AC
  Administered 2012-07-10: 3 g via INTRAVENOUS
  Filled 2012-07-10: qty 3000

## 2012-07-10 MED ORDER — ENOXAPARIN SODIUM 30 MG/0.3ML ~~LOC~~ SOLN: 30.0000 mg | SUBCUTANEOUS | Status: DC

## 2012-07-10 MED ORDER — ZOLPIDEM TARTRATE 5 MG PO TABS: 5.0000 mg | ORAL_TABLET | Freq: Every evening | ORAL | Status: DC | PRN

## 2012-07-10 MED ORDER — SUCCINYLCHOLINE CHLORIDE 20 MG/ML IJ SOLN
INTRAMUSCULAR | Status: DC | PRN
  Administered 2012-07-10: 120 mg via INTRAVENOUS

## 2012-07-10 MED ORDER — VECURONIUM BROMIDE 10 MG IV SOLR
INTRAVENOUS | Status: DC | PRN
  Administered 2012-07-10: 2 mg via INTRAVENOUS

## 2012-07-10 MED ORDER — INSULIN ASPART 100 UNIT/ML ~~LOC~~ SOLN
0.0000 [IU] | SUBCUTANEOUS | Status: DC
  Administered 2012-07-10 – 2012-07-11 (×2): 1 [IU] via SUBCUTANEOUS

## 2012-07-10 MED ORDER — BUPIVACAINE-EPINEPHRINE 0.5% -1:200000 IJ SOLN
INTRAMUSCULAR | Status: DC | PRN
  Administered 2012-07-10: 30 mL

## 2012-07-10 MED ORDER — ENOXAPARIN SODIUM 30 MG/0.3ML ~~LOC~~ SOLN
30.0000 mg | SUBCUTANEOUS | Status: DC
  Administered 2012-07-10 – 2012-07-14 (×5): 30 mg via SUBCUTANEOUS
  Filled 2012-07-10 (×6): qty 0.3

## 2012-07-10 MED ORDER — LIDOCAINE HCL (CARDIAC) 20 MG/ML IV SOLN
INTRAVENOUS | Status: DC | PRN
  Administered 2012-07-10: 60 mg via INTRAVENOUS

## 2012-07-10 MED ORDER — ALBUMIN HUMAN 5 % IV SOLN
INTRAVENOUS | Status: DC | PRN
  Administered 2012-07-10: 11:00:00 via INTRAVENOUS

## 2012-07-10 MED ORDER — NEOSTIGMINE METHYLSULFATE 1 MG/ML IJ SOLN
INTRAMUSCULAR | Status: DC | PRN
Start: 2012-07-10 — End: 2012-07-10
  Administered 2012-07-10: 1 mg via INTRAVENOUS

## 2012-07-10 MED ORDER — SODIUM CHLORIDE 0.9 % IV SOLN
INTRAVENOUS | Status: DC
  Administered 2012-07-10 (×3): via INTRAVENOUS

## 2012-07-10 MED ORDER — ACETAMINOPHEN 325 MG PO TABS
650.0000 mg | ORAL_TABLET | Freq: Four times a day (QID) | ORAL | Status: DC | PRN
  Administered 2012-07-11 – 2012-07-13 (×3): 650 mg via ORAL
  Filled 2012-07-10 (×3): qty 2

## 2012-07-10 MED ORDER — SODIUM CHLORIDE 0.9 % IV SOLN: INTRAVENOUS | Status: DC

## 2012-07-10 MED ORDER — HYDRALAZINE HCL 20 MG/ML IJ SOLN
10.0000 mg | Freq: Four times a day (QID) | INTRAMUSCULAR | Status: DC | PRN
  Administered 2012-07-14: 10 mg via INTRAVENOUS
  Filled 2012-07-10: qty 1

## 2012-07-10 MED ORDER — CHLORHEXIDINE GLUCONATE 4 % EX LIQD
60.0000 mL | Freq: Once | CUTANEOUS | Status: DC
  Filled 2012-07-10: qty 60

## 2012-07-10 MED ORDER — SENNA 8.6 MG PO TABS
2.0000 | ORAL_TABLET | Freq: Two times a day (BID) | ORAL | Status: DC
  Administered 2012-07-10 – 2012-07-15 (×11): 17.2 mg via ORAL
  Filled 2012-07-10 (×12): qty 2

## 2012-07-10 MED ORDER — GLYCOPYRROLATE 0.2 MG/ML IJ SOLN
INTRAMUSCULAR | Status: DC | PRN
  Administered 2012-07-10: 0.2 mg via INTRAVENOUS

## 2012-07-10 MED ORDER — FENTANYL CITRATE 0.05 MG/ML IJ SOLN
INTRAMUSCULAR | Status: DC | PRN
  Administered 2012-07-10 (×3): 50 ug via INTRAVENOUS

## 2012-07-10 SURGICAL SUPPLY — 61 items
3.2 DRILL BIT ×1 IMPLANT
3.2 GUIDE PIN ×1 IMPLANT
BANDAGE GAUZE ELAST BULKY 4 IN (GAUZE/BANDAGES/DRESSINGS) ×2 IMPLANT
BIT DRILL 3.8 CALIBRATED (BIT) ×1
BIT DRILL 3.8MM CALIBRATED (BIT) IMPLANT
BIT DRILL CANN SZ 5.5 (BIT) IMPLANT
BNDG COHESIVE 6X5 TAN STRL LF (GAUZE/BANDAGES/DRESSINGS) ×2 IMPLANT
CHLORAPREP W/TINT 26ML (MISCELLANEOUS) ×3 IMPLANT
CLOTH BEACON ORANGE TIMEOUT ST (SAFETY) ×2 IMPLANT
COVER MAYO STAND STRL (DRAPES) IMPLANT
COVER SURGICAL LIGHT HANDLE (MISCELLANEOUS) ×3 IMPLANT
DRAPE C-ARM 42X72 X-RAY (DRAPES) ×1 IMPLANT
DRAPE C-ARMOR (DRAPES) ×1 IMPLANT
DRAPE INCISE IOBAN 66X45 STRL (DRAPES) IMPLANT
DRAPE ORTHO SPLIT 77X108 STRL (DRAPES) ×4
DRAPE PROXIMA HALF (DRAPES) IMPLANT
DRAPE STERI IOBAN 125X83 (DRAPES) ×1 IMPLANT
DRAPE SURG ORHT 6 SPLT 77X108 (DRAPES) IMPLANT
DRAPE U-SHAPE 47X51 STRL (DRAPES) ×2 IMPLANT
DRILL BIT 3.8MM CALIBRATED (BIT) ×2
DRILL BIT CANN SZ 5.5 (BIT) ×2
DRSG ADAPTIC 3X8 NADH LF (GAUZE/BANDAGES/DRESSINGS) ×2 IMPLANT
DRSG MEPILEX BORDER 4X4 (GAUZE/BANDAGES/DRESSINGS) ×3 IMPLANT
DRSG MEPILEX BORDER 4X8 (GAUZE/BANDAGES/DRESSINGS) ×2 IMPLANT
ELECT REM PT RETURN 9FT ADLT (ELECTROSURGICAL) ×2
ELECTRODE REM PT RTRN 9FT ADLT (ELECTROSURGICAL) ×1 IMPLANT
EVACUATOR 1/8 PVC DRAIN (DRAIN) IMPLANT
FACESHIELD LNG OPTICON STERILE (SAFETY) ×2 IMPLANT
GLOVE BIO SURGEON STRL SZ8 (GLOVE) ×3 IMPLANT
GLOVE BIOGEL PI IND STRL 8 (GLOVE) ×1 IMPLANT
GLOVE BIOGEL PI INDICATOR 8 (GLOVE) ×2
GOWN PREVENTION PLUS XLARGE (GOWN DISPOSABLE) ×2 IMPLANT
GOWN STRL NON-REIN LRG LVL3 (GOWN DISPOSABLE) ×1 IMPLANT
KIT ROOM TURNOVER OR (KITS) ×2 IMPLANT
MANIFOLD NEPTUNE II (INSTRUMENTS) ×2 IMPLANT
NEEDLE 22X1 1/2 (OR ONLY) (NEEDLE) ×1 IMPLANT
NS IRRIG 1000ML POUR BTL (IV SOLUTION) ×2 IMPLANT
PACK GENERAL/GYN (CUSTOM PROCEDURE TRAY) ×2 IMPLANT
PAD ARMBOARD 7.5X6 YLW CONV (MISCELLANEOUS) ×4 IMPLANT
PADDING CAST SYNTHETIC 4 (CAST SUPPLIES)
PADDING CAST SYNTHETIC 4X4 STR (CAST SUPPLIES) ×1 IMPLANT
PLATE FEMORAL 18 HOLE LT (Plate) ×1 IMPLANT
SCREW LOCK CORT PLY 4.5X34 (Screw) ×1 IMPLANT
SCREW LOCK DIST FEM 5.5X100 (Screw) ×1 IMPLANT
SCREW LOCK DIST FEM 5.5X90 (Screw) ×1 IMPLANT
SCREW LOCK DIST FEM 5.5X95 (Screw) ×2 IMPLANT
SCREW LOCK PLY PROX TIB 8X90 (Screw) ×1 IMPLANT
SCREW NLOCK CORT 4.5X40 (Screw) ×1 IMPLANT
SCREW NLOCK CORT 4.5X44 (Screw) ×1 IMPLANT
SCREW NLOCK CORT 4.5X56 (Screw) ×1 IMPLANT
STAPLER VISISTAT 35W (STAPLE) ×1 IMPLANT
SUT MNCRL AB 4-0 PS2 18 (SUTURE) ×1 IMPLANT
SUT PROLENE 3 0 PS 1 (SUTURE) ×1 IMPLANT
SUT VIC AB 0 CT1 27 (SUTURE) ×4
SUT VIC AB 0 CT1 27XBRD ANBCTR (SUTURE) ×2 IMPLANT
SUT VIC AB 2-0 CT1 27 (SUTURE) ×2
SUT VIC AB 2-0 CT1 TAPERPNT 27 (SUTURE) ×2 IMPLANT
SYR CONTROL 10ML LL (SYRINGE) ×1 IMPLANT
TOWEL OR 17X24 6PK STRL BLUE (TOWEL DISPOSABLE) ×1 IMPLANT
TOWEL OR 17X26 10 PK STRL BLUE (TOWEL DISPOSABLE) ×3 IMPLANT
WATER STERILE IRR 1000ML POUR (IV SOLUTION) ×1 IMPLANT

## 2012-07-10 NOTE — Transfer of Care (Signed)
Immediate Anesthesia Transfer of Care Note  Patient: Chad [name]  Procedure(s) Performed: Procedure(s): IM FEMORAL NAIL (Left)  Patient Location: PACU  Anesthesia Type:General  Level of Consciousness: sedated  Airway & Oxygen Therapy: Patient Spontanous Breathing and Patient connected to nasal cannula oxygen  Post-op Assessment: Report given to PACU RN and Post -op Vital signs reviewed and stable  Post vital signs: Reviewed and stable  Complications: No apparent anesthesia complications

## 2012-07-10 NOTE — Op Note (Signed)
NAMEALFERD, Chad [name] NO.:  [id number]  MEDICAL RECORD NO.:  [id number]  LOCATION:  5N01C                        FACILITY:  [facility]  PHYSICIAN:  Toni Arthurs, MD        DATE OF BIRTH:  September 05, 1955  DATE OF PROCEDURE:  07/10/2012 DATE OF DISCHARGE:                              OPERATIVE REPORT   PREOPERATIVE DIAGNOSIS:  Left distal femur intra-articular fracture.  POSTOPERATIVE DIAGNOSIS:  Left distal femur intra-articular fracture.  PROCEDURE:  Open reduction and internal fixation of left distal femur fracture.  SURGEON:  Toni Arthurs, MD  ANESTHESIA:  General.  ESTIMATED BLOOD LOSS:  250 mL.  COMPLICATIONS:  None apparent.  DISPOSITION:  Extubated, awake, and stable to recovery.  INDICATIONS FOR PROCEDURE:  The patient is a 57 year old male with past medical history significant for end-stage renal disease, status post cadaveric renal transplant and hepatitis.  He fell yesterday at home landing on the front of his left knee.  He had immediate pain, was unable to bear weight.  He was seen in the emergency department.  X-rays and the CT scan reveal a supracondylar fracture of the distal femur that extends to the articular surface and is displaced.  He presents now for operative treatment of this unstable displaced injury.  He understands the risks and benefits of the alternative treatment options and elects surgical treatment.  He specifically understands risks of bleeding, infection, nerve damage, blood clots, need for additional surgery, amputation, and death.  This fracture occurred distal to a well-fixed total hip prosthesis.  This prevented the use of an intramedullary nail and required the use of plate fixation to extend proximally to the level of the prosthesis.  PROCEDURE IN DETAIL:  After preoperative consent was obtained and the correct operative site was identified, the patient was brought to the operating room and placed supine on the operating  table.  General anesthesia was induced.  Preoperative antibiotics were administered. Surgical time-out was taken.  Left lower extremity was prepped and draped in standard sterile fashion.  A longitudinal incision was made at the lateral aspect of the distal thigh.  Sharp dissection was carried down through the skin and subcutaneous tissue to the level of the IT band.  The IT band was split in line with its fibers.  The fracture site was identified.  It was left undisturbed to the extent possible.  A template was used to select the appropriate plate size.  An 18 hole Polyax plate from the [employer] set was selected.  It was slid submuscularly along the lateral aspect of the femoral shaft.  A stab incision was made over the medial femoral condyle.  A large tenaculum was placed through the skin against the medial condyle and through the plate laterally.  The plate was positioned appropriately at the distal end of the femur.  The intercondylar fracture line was compressed appropriately.  AP and lateral radiographs showed appropriate position of the plate distally.  A guide pin was then inserted through the targeting hole in the central aspect of the plate.  The targeting guide was removed and the cannulated drill bit was used to drill through the condyles.  An 8 mm cancellous  screw was then inserted until it engaged with the plate and locked distally.  Four more locking screws were inserted from lateral to medial and locked into the plate.  AP and lateral fluoroscopic images confirmed appropriate position and length of all of these screws.  Proximally the appropriate position of the plate was verified on AP and lateral fluoroscopic images.  A nonlocking screw was inserted proximal to the fracture site.  This pulled the plate down to the bone appropriately.  The appropriate alignment at the distal bone block was confirmed on AP and lateral radiographic images.  Targeting guide was then used to  insert 3 more screws proximally.  These were put in at about the level of the mid shaft.  These were nonlocking screw proximally and distally and a locking screw in the center of these 2 nonlocking screws.  These were all placed in bicortical fashion.  The plate was noted to extend several holes proximal to the femoral prosthesis.  Final AP and lateral radiographs confirmed appropriate position and length of all hardware and appropriate reduction of the fracture site.  The wound was irrigated copiously.  The IT band was closed with figure-of-eight sutures of 0 Vicryl.  Subcutaneous tissue was approximated with inverted simple sutures of 3-0 Monocryl.  A running 3-0 nylon was used to close the skin incision.  Sterile dressings were applied followed by a compression wrap.  The patient was awakened from anesthesia and transported to the recovery room in stable condition.  FOLLOWUP PLAN:  The patient will be toe-touch weightbearing on the left lower extremity.  He will be started on anticoagulation tomorrow.  He will likely need placement in acute rehab before he could go home.     Toni Arthurs, MD     JH/MEDQ  D:  07/10/2012  T:  07/10/2012  Job:  [id number]

## 2012-07-10 NOTE — Anesthesia Postprocedure Evaluation (Signed)
  Anesthesia Post-op Note  Patient: Chad [name]  Procedure(s) Performed: Procedure(s): IM FEMORAL NAIL (Left)  Patient Location: PACU  Anesthesia Type:General  Level of Consciousness: awake  Airway and Oxygen Therapy: Patient Spontanous Breathing  Post-op Pain: mild  Post-op Assessment: Post-op Vital signs reviewed  Post-op Vital Signs: Reviewed  Complications: No apparent anesthesia complications

## 2012-07-10 NOTE — Anesthesia Procedure Notes (Signed)
Procedure Name: Intubation Date/Time: 07/10/2012 10:51 AM Performed by: Alanda Amass A Pre-anesthesia Checklist: Patient identified, Timeout performed, Emergency Drugs available, Suction available and Patient being monitored Patient Re-evaluated:Patient Re-evaluated prior to inductionOxygen Delivery Method: Circle system utilized Preoxygenation: Pre-oxygenation with 100% oxygen Intubation Type: IV induction Laryngoscope Size: Mac and 3 Grade View: Grade II Tube type: Oral Tube size: 7.5 mm Number of attempts: 1 Airway Equipment and Method: Stylet Placement Confirmation: ETT inserted through vocal cords under direct vision,  breath sounds checked- equal and bilateral and positive ETCO2 Secured at: 22 cm Tube secured with: Tape Dental Injury: Teeth and Oropharynx as per pre-operative assessment

## 2012-07-10 NOTE — Preoperative (Signed)
Beta Blockers   Reason not to administer Beta Blockers:routine dose of beta blocker given to pt. on  nursing unit this am

## 2012-07-10 NOTE — H&P (Signed)
[name] History and Physical  [name] [name]:[id number] DOB: May 06, 1955 DOA: 07/09/2012  Referring physician:  PCP: [name], MD  Specialists:   Chief Complaint:   HPI: Chad [name] is a 57 y.o. male with multiple medical problems who slipped and fell in  His home at around 9 pm.   He denies syncope or dizziness, he does report having weakness in his left knee since surgery in past.  He reports that the knee gave way.   He was evaluated in the ED and was found to have an acute fracture of the distal femur and a chronic tibial plateau fracture on X-ray and Ct scan of the Left knee.   Orthopedics was consulted and Dr. [name] is to see the patient in the AM on 06/14.      Review of Systems: The patient denies anorexia, fever, chills, headaches, weight loss, vision loss, diplopia, dizziness, decreased hearing, rhinitis, hoarseness, chest pain, syncope, dyspnea on exertion, balance deficits, cough, hemoptysis, abdominal pain, nausea, vomiting, diarrhea, constipation, hematemesis, melena, hematochezia, severe indigestion/heartburn, dysuria, hematuria, incontinence, suspicious skin lesions, transient blindness, difficulty walking, depression, unusual weight change, abnormal bleeding, enlarged lymph nodes, angioedema, and breast masses.    Past Medical History  Diagnosis Date  . Hypertension   . Hepatitis C 1996  . CKD (chronic kidney disease), stage IV     a. s/p renal transplant 1996.  Marland Kitchen Pneumonia   . HLD (hyperlipidemia)   . Nonischemic cardiomyopathy     a. unknown etiology, EF 30-35% by echo 09/2011;  b. 09/2011 Lexi MV EF 42%, no ischemia/infarct.  . Systolic CHF   . Atrial flutter     a. in the setting of sepsis 05/2011; on amio; no anticoagulation    Past Surgical History  Procedure Laterality Date  . Nephrectomy transplanted organ    . Insertion of dialysis catheter  06/20/2011    Procedure: INSERTION OF DIALYSIS CATHETER;  Surgeon: [name], MD;   Location: [facility] OR;  Service: Vascular;  Laterality: Right;  Ultrasound guided insertion of right internal jugular dialysis catheter  . Multiple extractions with alveoloplasty  06/27/2011    Procedure: MULTIPLE EXTRACION WITH ALVEOLOPLASTY;  Surgeon: [name], DDS;  Location: [facility] OR;  Service: Oral Surgery;  Laterality: N/A;  Extraction  of tooth # 14 with alveoloplasty  . Hip surgery    . Joint replacement      left hip    Prior to Admission medications   Medication Sig Start Date End Date Taking? Authorizing Provider  allopurinol (ZYLOPRIM) 100 MG tablet Take 200 mg by mouth 2 (two) times daily.   Yes Historical Provider, MD  amiodarone (PACERONE) 200 MG tablet Take 200 mg by mouth daily. 07/16/11 07/15/12 Yes Daniel J Angiulli, PA-C  aspirin EC 81 MG tablet Take 81 mg by mouth daily.   Yes Historical Provider, MD  carvedilol (COREG) 25 MG tablet Take 25 mg by mouth 2 (two) times daily with a meal.   Yes Historical Provider, MD  furosemide (LASIX) 80 MG tablet Take 1 tablet (80 mg total) by mouth 2 (two) times daily. 03/16/12  Yes [name], MD  hydrALAZINE (APRESOLINE) 50 MG tablet Take 75 mg by mouth 3 (three) times daily. Takes 1.5 tablets   Yes Historical Provider, MD  HYDROcodone-acetaminophen (NORCO/VICODIN) 5-325 MG per tablet Take 1-2 tablets by mouth every 4 (four) hours as needed for pain. 03/16/12  Yes [name], MD  isosorbide mononitrate (IMDUR) 60  MG 24 hr tablet Take 1 tablet (60 mg total) by mouth daily. 03/16/12  Yes [name], MD  omeprazole (PRILOSEC) 20 MG capsule Take 20 mg by mouth daily.  10/28/11  Yes Historical Provider, MD  potassium chloride (K-DUR,KLOR-CON) 10 MEQ tablet Take 10 mEq by mouth daily.   Yes Historical Provider, MD  pravastatin (PRAVACHOL) 40 MG tablet Take 40 mg by mouth daily.   Yes Historical Provider, MD  predniSONE (DELTASONE) 10 MG tablet Take 10 mg by mouth daily.   Yes Historical Provider, MD   sirolimus (RAPAMUNE) 1 MG/ML solution Take 1.2 mg by mouth daily.   Yes Historical Provider, MD    No Known Allergies  Social History:  reports that he has never smoked. He has never used smokeless tobacco. He reports that he does not drink alcohol or use illicit drugs.     Family History  Problem Relation Age of Onset  . Heart disease Neg Hx     (be sure to complete)   Physical Exam:  GEN:  Pleasant Obese  56 y.o.African American  male  examined  and in no acute distress; cooperative with exam Filed Vitals:   07/09/12 2155 07/09/12 2205 07/10/12 0115 07/10/12 0130  BP:  156/97 123/80 131/75  Pulse:  89 75 77  Temp:  98.6 F (37 C)    TempSrc:  Oral    Resp:  16 14 12   SpO2: 95% 95% 95% 96%   Blood pressure 131/75, pulse 77, temperature 98.6 F (37 C), temperature source Oral, resp. rate 12, SpO2 96.00%. PSYCH: He is alert and oriented x4; does not appear anxious does not appear depressed; affect is normal HEENT: Normocephalic and Atraumatic, Mucous membranes pink; PERRLA; EOM intact; Fundi:  Benign;  No scleral icterus, Nares: Patent, Oropharynx: Clear, Fair Dentition, Neck:  FROM, no cervical lymphadenopathy nor thyromegaly or carotid bruit; no JVD; Breasts:: Not examined CHEST WALL: No tenderness CHEST: Normal respiration, clear to auscultation bilaterally HEART: Regular rate and rhythm; no murmurs rubs or gallops BACK: No kyphosis or scoliosis; no CVA tenderness ABDOMEN: Positive Bowel Sounds, Obese, soft non-tender; no masses, no organomegaly. Rectal Exam: Not done EXTREMITIES: No cyanosis, clubbing or edema; no ulcerations.  LLE in Knee Immobilizer Genitalia: not examined PULSES: 2+ and symmetric SKIN: Normal hydration no rash or ulceration CNS: Cranial nerves 2-12 grossly intact no focal neurologic deficit    Labs on Admission:  Basic Metabolic Panel:  Recent Labs Lab 07/09/12 2310  NA 135  K 3.6  CL 97  CO2 26  GLUCOSE 170*  BUN 92*  CREATININE 4.17*   CALCIUM 8.7   Liver Function Tests: No results found for this basename: AST, ALT, ALKPHOS, BILITOT, PROT, ALBUMIN,  in the last 168 hours No results found for this basename: LIPASE, AMYLASE,  in the last 168 hours No results found for this basename: AMMONIA,  in the last 168 hours CBC:  Recent Labs Lab 07/09/12 2310  WBC 12.6*  NEUTROABS 10.6*  HGB 11.4*  HCT 34.8*  MCV 88.1  PLT 140*   Cardiac Enzymes: No results found for this basename: CKTOTAL, CKMB, CKMBINDEX, TROPONINI,  in the last 168 hours  BNP (last 3 results)  Recent Labs  12/30/11 1057 03/12/12 0449 03/15/12 0440  PROBNP 534.0* 14215.0* 30340.0*   CBG: No results found for this basename: GLUCAP,  in the last 168 hours  Radiological Exams on Admission: Dg Pelvis 1-2 Views  07/09/2012   *RADIOLOGY REPORT*  Clinical Data: Left  pelvic pain following a fall tonight.  PELVIS - 1-2 VIEW  Comparison: Abdomen and pelvis CT dated 08/09/2011.  Findings: Again demonstrated is a left total hip prosthesis with thinning and deformity of the acetabulum.  No fracture or dislocation seen.  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  No acute abnormality.   Original Report Authenticated By: Beckie Salts, M.D.   Dg Femur Left  07/09/2012   *RADIOLOGY REPORT*  Clinical Data: Left leg pain following a fall tonight.  LEFT FEMUR - 2 VIEW  Comparison: Left knee radiographs and CT dated 03/30/2009 and 03/31/2009 respectively.  Pelvis radiograph obtained today.  Findings: Left total hip prosthesis with the previously described chronic acetabular thinning and deformity.  Mildly impacted comminuted fracture of the distal femoral metaphysis without significant displacement or angulation.  Possible fracture of the posterior aspect of one of the femoral condyles, proximally. Diffuse soft tissue swelling at the level of the knee, most pronounced anteriorly.  Small knee joint effusion.  IMPRESSION:  1.   Comminuted, mildly impacted fracture of the distal femoral metaphysis and a possible fracture of the posterior aspect of one of the femoral condyles, proximally. 2.  Small effusion.   Original Report Authenticated By: Beckie Salts, M.D.   Dg Knee 1-2 Views Left  07/09/2012   *RADIOLOGY REPORT*  Clinical Data: Pain post fall.  LEFT KNEE - 1-2 VIEW  Comparison: 03/30/2009  Findings: Transverse comminuted fracture across the distal femoral metadiaphyseal region, impacted with several small fracture fragments.  No definite involvement of the femoral condyles on these two radiographs.  Probable depressed fracture of the lateral tibial plateau.  IMPRESSION: 1. Transverse comminuted fracture, distal femur. 2.  Probable depressed lateral tibial plateau fracture.   Original Report Authenticated By: D. Andria Rhein, MD   Ct Knee Left Wo Contrast  07/10/2012   *RADIOLOGY REPORT*  Clinical Data: Pain post fall.  CT OF THE LEFT KNEE WITHOUT CONTRAST  Technique:  Multidetector CT imaging was performed according to the standard protocol. Multiplanar CT image reconstructions were also generated.  Comparison: Radiographs from the previous day, and earlier studies  Findings: Comminuted fracture of the distal femoral metadiaphyseal region extending to involve the anterior subchondral cortex of both femoral condyles, with a fracture   component in the sagittal plane extending to the intercondylar notch.  There is impaction of the proximal shaft onto the fracture fragments, with anterior angulation of the distal fracture fragment complex.  There is an old avulsion fracture deformity of the posterior proximal tibia at the level of the PCL.  No new tibial plateau fracture is evident. Hemarthrosis is evident as well as regional soft tissue swelling.  IMPRESSION: 1.  Comminuted intra-articular distal femur fracture as above. 2.  Chronic fracture deformity of the posterior tibia at the PCL insertion.   Original Report Authenticated By:  D. Andria Rhein, MD     Assessment/Plan Principal Problem:   Fracture, femur, distal Active Problems:   CKD (chronic kidney disease)   Hypertension   Cardiomyopathy   Chronic systolic CHF (congestive heart failure)   PAF (paroxysmal atrial fibrillation)   Hyperglycemia   Anemia    1.   Fx of Left Distal Femur- Ortho: Dr. [name] to see in Am , possbile Surgery in AM 06/14. NPO until procedure, Pain Control and knee Immobilizer per Ortho for now.    2.   CKD and Renal Tx-  Monitor BUN.Cr, Notify Renal of admission for consultation and care.  Continue  Rappimmune Rx.    3.   HTN - IV metoprolol and IV Hydralazine while NPO.    4.   Cardiomyopathy/ and Systolic CHF- Furosemide IV when needed and monitor for signs of fluid overload, current compensated.    5.   PAF-Rate control with IV Metoprolol, amiodarone on hold for now, resume post operatively. Order EKG.    6.   Hyperglycemia-  SSI coverage PRN, check HBA1C.     7.   Anemia- due to chronic disease and Renal Disease, send anemia panel.    8.  SCDs for DVT prophylaxis.       Code Status:    FULL CODE Family Communication:    Family at Bedside Disposition Plan:  TBA     Time spent: 34 Minutes  Ron Parker [name] Pager [phone]  If 7PM-7AM, please contact night-coverage [email or web address] Password [email or web address] 07/10/2012, 2:20 AM

## 2012-07-10 NOTE — Anesthesia Preprocedure Evaluation (Addendum)
Anesthesia Evaluation  Patient identified by MRN, date of birth, ID band Patient awake    Reviewed: Allergy & Precautions, H&P , NPO status , Patient's Chart, lab work & pertinent test results  Airway Mallampati: II TM Distance: >3 FB Neck ROM: Full    Dental  (+) Teeth Intact and Dental Advisory Given   Pulmonary sleep apnea , pneumonia -,  breath sounds clear to auscultation        Cardiovascular hypertension, Pt. on medications and Pt. on home beta blockers +CHF + dysrhythmias Atrial Fibrillation Rhythm:Regular Rate:Normal     Neuro/Psych    GI/Hepatic (+) Hepatitis -, C and B  Endo/Other    Renal/GU Renal InsufficiencyRenal disease     Musculoskeletal   Abdominal   Peds  Hematology   Anesthesia Other Findings   Reproductive/Obstetrics                        Anesthesia Physical Anesthesia Plan  ASA: III  Anesthesia Plan: General   Post-op Pain Management:    Induction: Intravenous  Airway Management Planned: Oral ETT  Additional Equipment:   Intra-op Plan:   Post-operative Plan: Possible Post-op intubation/ventilation  Informed Consent: I have reviewed the patients History and Physical, chart, labs and discussed the procedure including the risks, benefits and alternatives for the proposed anesthesia with the patient or authorized representative who has indicated his/her understanding and acceptance.   Dental advisory given  Plan Discussed with: CRNA and Anesthesiologist  Anesthesia Plan Comments:        Anesthesia Quick Evaluation

## 2012-07-10 NOTE — Progress Notes (Signed)
Orthopedic Tech Progress Note Patient Details:  Chad [name] 22-Feb-1955 [id number]  Patient ID: Ollen Barges, male   DOB: [birth year], 56 y.o.   MRN: [id number]   Shawnie Pons 07/10/2012, 3:29 PM Called [employer] for hinged knee brace.

## 2012-07-10 NOTE — Consult Note (Signed)
Reason for Consult:  Left thigh pain Referring Physician: [name] is an 57 y.o. male.  HPI: 57 y/o male with PMH of renal transplant fell yesterday at home landing on his left knee.  He c/o aching pain in the left thigh that is sharp and severe with motion and dull and aching at rest.  Improved with knee immobilizer.  He has a THA on the left as well.  No recent problems with his transplant.  No h/o smoking or diabetes.  Past Medical History  Diagnosis Date  . Hypertension   . Hepatitis C 1996  . CKD (chronic kidney disease), stage IV     a. s/p renal transplant 1996.  Marland Kitchen Pneumonia   . HLD (hyperlipidemia)   . Nonischemic cardiomyopathy     a. unknown etiology, EF 30-35% by echo 09/2011;  b. 09/2011 Lexi MV EF 42%, no ischemia/infarct.  . Systolic CHF   . Atrial flutter     a. in the setting of sepsis 05/2011; on amio; no anticoagulation    Past Surgical History  Procedure Laterality Date  . Nephrectomy transplanted organ    . Insertion of dialysis catheter  06/20/2011    Procedure: INSERTION OF DIALYSIS CATHETER;  Surgeon: Chad Libman, MD;  Location: [facility] OR;  Service: Vascular;  Laterality: Right;  Ultrasound guided insertion of right internal jugular dialysis catheter  . Multiple extractions with alveoloplasty  06/27/2011    Procedure: MULTIPLE EXTRACION WITH ALVEOLOPLASTY;  Surgeon: Chad [name], DDS;  Location: [facility] OR;  Service: Oral Surgery;  Laterality: N/A;  Extraction  of tooth # 14 with alveoloplasty  . Hip surgery    . Joint replacement      left hip    Family History  Problem Relation Age of Onset  . Heart disease Neg Hx     Social History:  reports that he has never smoked. He has never used smokeless tobacco. He reports that he does not drink alcohol or use illicit drugs.  Allergies: No Known Allergies  Medications: I have reviewed the patient's current medications.  Results for orders placed during the hospital encounter of 07/09/12 (from  the past 48 hour(s))  CBC WITH DIFFERENTIAL     Status: Abnormal   Collection Time    07/09/12 11:10 PM      Result Value Range   WBC 12.6 (*) 4.0 - 10.5 K/uL   RBC 3.95 (*) 4.22 - 5.81 MIL/uL   Hemoglobin 11.4 (*) 13.0 - 17.0 g/dL   HCT 16.1 (*) 09.6 - 04.5 %   MCV 88.1  78.0 - 100.0 fL   MCH 28.9  26.0 - 34.0 pg   MCHC 32.8  30.0 - 36.0 g/dL   RDW 40.9 (*) 81.1 - 91.4 %   Platelets 140 (*) 150 - 400 K/uL   Neutrophils Relative % 84 (*) 43 - 77 %   Neutro Abs 10.6 (*) 1.7 - 7.7 K/uL   Lymphocytes Relative 8 (*) 12 - 46 %   Lymphs Abs 1.0  0.7 - 4.0 K/uL   Monocytes Relative 8  3 - 12 %   Monocytes Absolute 1.0  0.1 - 1.0 K/uL   Eosinophils Relative 0  0 - 5 %   Eosinophils Absolute 0.0  0.0 - 0.7 K/uL   Basophils Relative 0  0 - 1 %   Basophils Absolute 0.0  0.0 - 0.1 K/uL  BASIC METABOLIC PANEL     Status: Abnormal   Collection  Time    07/09/12 11:10 PM      Result Value Range   Sodium 135  135 - 145 mEq/L   Potassium 3.6  3.5 - 5.1 mEq/L   Chloride 97  96 - 112 mEq/L   CO2 26  19 - 32 mEq/L   Glucose, Bld 170 (*) 70 - 99 mg/dL   BUN 92 (*) 6 - 23 mg/dL   Creatinine, Ser 2.13 (*) 0.50 - 1.35 mg/dL   Calcium 8.7  8.4 - 08.6 mg/dL   GFR calc non Af Amer 15 (*) >90 mL/min   GFR calc Af Amer 17 (*) >90 mL/min   Comment:            The eGFR has been calculated     using the CKD EPI equation.     This calculation has not been     validated in all clinical     situations.     eGFR's persistently     <90 mL/min signify     possible Chronic Kidney Disease.  SURGICAL PCR SCREEN     Status: None   Collection Time    07/10/12  2:42 AM      Result Value Range   MRSA, PCR NEGATIVE  NEGATIVE   Staphylococcus aureus NEGATIVE  NEGATIVE   Comment:            The Xpert SA Assay (FDA     approved for NASAL specimens     in patients over 65 years of age),     is one component of     a comprehensive surveillance     program.  Test performance has     been validated by [employer] for patients greater     than or equal to 25 year old.     It is not intended     to diagnose infection nor to     guide or monitor treatment.  GLUCOSE, CAPILLARY     Status: Abnormal   Collection Time    07/10/12  3:48 AM      Result Value Range   Glucose-Capillary 115 (*) 70 - 99 mg/dL  BASIC METABOLIC PANEL     Status: Abnormal   Collection Time    07/10/12  5:40 AM      Result Value Range   Sodium 137  135 - 145 mEq/L   Potassium 3.4 (*) 3.5 - 5.1 mEq/L   Chloride 99  96 - 112 mEq/L   CO2 24  19 - 32 mEq/L   Glucose, Bld 119 (*) 70 - 99 mg/dL   BUN 92 (*) 6 - 23 mg/dL   Creatinine, Ser 5.78 (*) 0.50 - 1.35 mg/dL   Calcium 8.7  8.4 - 46.9 mg/dL   GFR calc non Af Amer 15 (*) >90 mL/min   GFR calc Af Amer 18 (*) >90 mL/min   Comment:            The eGFR has been calculated     using the CKD EPI equation.     This calculation has not been     validated in all clinical     situations.     eGFR's persistently     <90 mL/min signify     possible Chronic Kidney Disease.  CBC     Status: Abnormal   Collection Time    07/10/12  5:40 AM      Result Value Range  WBC 15.2 (*) 4.0 - 10.5 K/uL   RBC 3.98 (*) 4.22 - 5.81 MIL/uL   Hemoglobin 11.2 (*) 13.0 - 17.0 g/dL   HCT 45.4 (*) 09.8 - 11.9 %   MCV 87.9  78.0 - 100.0 fL   MCH 28.1  26.0 - 34.0 pg   MCHC 32.0  30.0 - 36.0 g/dL   RDW 14.7 (*) 82.9 - 56.2 %   Platelets 126 (*) 150 - 400 K/uL  RETICULOCYTES     Status: Abnormal   Collection Time    07/10/12  5:40 AM      Result Value Range   Retic Ct Pct 3.4 (*) 0.4 - 3.1 %   RBC. 3.98 (*) 4.22 - 5.81 MIL/uL   Retic Count, Manual 135.3  19.0 - 186.0 K/uL    Dg Pelvis 1-2 Views  07/09/2012   *RADIOLOGY REPORT*  Clinical Data: Left pelvic pain following a fall tonight.  PELVIS - 1-2 VIEW  Comparison: Abdomen and pelvis CT dated 08/09/2011.  Findings: Again demonstrated is a left total hip prosthesis with thinning and deformity of the acetabulum.  No fracture or  dislocation seen.  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  No acute abnormality.   Original Report Authenticated By: Chad [name], M.D.   Dg Femur Left  07/09/2012   *RADIOLOGY REPORT*  Clinical Data: Left leg pain following a fall tonight.  LEFT FEMUR - 2 VIEW  Comparison: Left knee radiographs and CT dated 03/30/2009 and 03/31/2009 respectively.  Pelvis radiograph obtained today.  Findings: Left total hip prosthesis with the previously described chronic acetabular thinning and deformity.  Mildly impacted comminuted fracture of the distal femoral metaphysis without significant displacement or angulation.  Possible fracture of the posterior aspect of one of the femoral condyles, proximally. Diffuse soft tissue swelling at the level of the knee, most pronounced anteriorly.  Small knee joint effusion.  IMPRESSION:  1.  Comminuted, mildly impacted fracture of the distal femoral metaphysis and a possible fracture of the posterior aspect of one of the femoral condyles, proximally. 2.  Small effusion.   Original Report Authenticated By: Chad [name], M.D.   Dg Knee 1-2 Views Left  07/09/2012   *RADIOLOGY REPORT*  Clinical Data: Pain post fall.  LEFT KNEE - 1-2 VIEW  Comparison: 03/30/2009  Findings: Transverse comminuted fracture across the distal femoral metadiaphyseal region, impacted with several small fracture fragments.  No definite involvement of the femoral condyles on these two radiographs.  Probable depressed fracture of the lateral tibial plateau.  IMPRESSION: 1. Transverse comminuted fracture, distal femur. 2.  Probable depressed lateral tibial plateau fracture.   Original Report Authenticated By: D. Andria Rhein, MD   Ct Knee Left Wo Contrast  07/10/2012   *RADIOLOGY REPORT*  Clinical Data: Pain post fall.  CT OF THE LEFT KNEE WITHOUT CONTRAST  Technique:  Multidetector CT imaging was performed according to the standard protocol. Multiplanar CT image  reconstructions were also generated.  Comparison: Radiographs from the previous day, and earlier studies  Findings: Comminuted fracture of the distal femoral metadiaphyseal region extending to involve the anterior subchondral cortex of both femoral condyles, with a fracture   component in the sagittal plane extending to the intercondylar notch.  There is impaction of the proximal shaft onto the fracture fragments, with anterior angulation of the distal fracture fragment complex.  There is an old avulsion fracture deformity of the posterior proximal tibia at the level of the PCL.  No new  tibial plateau fracture is evident. Hemarthrosis is evident as well as regional soft tissue swelling.  IMPRESSION: 1.  Comminuted intra-articular distal femur fracture as above. 2.  Chronic fracture deformity of the posterior tibia at the PCL insertion.   Original Report Authenticated By: D. Andria Rhein, MD   Dg Chest Port 1 View  09-Aug-2012   *RADIOLOGY REPORT*  Clinical Data: Preoperative chest x-ray  PORTABLE CHEST - 1 VIEW  Comparison: Prior chest x-ray 03/13/2012  Findings: Stable cardiomegaly.  No pulmonary edema, there is been reduced pulmonary vascular congestion compared to the prior study. No pneumothorax or pleural effusion.  Nonspecific patchy opacity in the periphery of the right lung.  No acute osseous abnormality.  IMPRESSION:  1.  Nonspecific patchy opacity in the right mid lung may reflect atelectasis, underlying parenchymal scarring or in the appropriate clinical setting, and early infiltrate.  Recommend dedicated PA and lateral chest x-ray to further evaluate.  2.  Stable cardiomegaly without evidence of edema.   Original Report Authenticated By: Malachy Moan, M.D.    ROS:  No recent f/c/n/v/wt loss PE:  Blood pressure 150/70, pulse 77, temperature 98.6 F (37 C), temperature source Oral, resp. rate 12, SpO2 96.00%. wn wd male in nad.  A and O x 4.  Mood and affect normal.  EOMI.  Resp unlabored.  L  thigh swollen.  5/5 strength in PF and DF of the ankle.  Sens to LT intact at the foot.  1+ dp and pt pulses.  No lymphadenopathy.  Assessment/Plan: L distal femur fracture with intracondylar extension - to OR for ORIF v. Ex fix.  The risks and benefits of the alternative treatment options have been discussed in detail.  The patient wishes to proceed with surgery and specifically understands risks of bleeding, infection, nerve damage, blood clots, need for additional surgery, amputation and death.   Chad [name] 2012/08/09, 7:45 AM

## 2012-07-10 NOTE — ED Notes (Signed)
Ortho paged and responded for Knee immobilizer placement

## 2012-07-10 NOTE — ED Provider Notes (Signed)
I saw and evaluated the patient, reviewed the resident's note and I agree with the findings and plan.  Pt with mechanical fall onto knee. He has distal femur fracture, pain controlled. Admit for further management. Ortho, Hospitalist consults.   Charles B. Bernette Mayers, MD 07/10/12 1537

## 2012-07-10 NOTE — Progress Notes (Signed)
[name] PROGRESS NOTE  Chad [name] GNF:[id number] DOB: 1956-01-22 DOA: 07/09/2012 PCP: [name], MD  Assessment/Plan: 1. Fx of Left Distal Femur-  -Ortho: Dr. [name] consulted, Pending Surgery. NPO until procedure -Pain Control and knee Immobilizer per Ortho for now.  2. CKD and Renal Tx -Monitor BUN.Cr -Chart reviewed. Pt noted to have hx of stage 4 ckd with baseline Cr of around 3.5 as of 2/14.  -Continue Rappimmune Rx -Cont hydration.  -Cr trending down.  -May consider Nephro consult if needed. Followed by Dr. Caryn Section 3. HTN  - IV metoprolol and IV Hydralazine while NPO.  4. Cardiomyopathy/ and Systolic CHF - Furosemide IV when needed and monitor for signs of fluid overload, current compensated.  5. PAF -Rate control with IV Metoprolol, amiodarone on hold for now, resume post operatively 6. Hyperglycemia - SSI coverage PRN, check HBA1C.  7. Anemia - due to chronic disease and Renal Disease, send anemia panel.  8. SCDs for DVT prophylaxis.    Code Status: Full Family Communication: Pt in room (indicate person spoken with, relationship, and if by phone, the number) Disposition Plan: Pending   Consultants:  Orthopedic Surgery  Nephrology  Procedures:  ORIF of L distal femur fracture  HPI/Subjective: Complains of post operative pain. No other complaints  Objective: Filed Vitals:   07/09/12 2205 07/10/12 0115 07/10/12 0130 07/10/12 0555  BP: 156/97 123/80 131/75 150/70  Pulse: 89 75 77   Temp: 98.6 F (37 C)     TempSrc: Oral     Resp: 16 14 12    SpO2: 95% 95% 96%     Intake/Output Summary (Last 24 hours) at 07/10/12 1002 Last data filed at 07/10/12 0500  Gross per 24 hour  Intake    375 ml  Output    300 ml  Net     75 ml   There were no vitals filed for this visit.  Exam:   General:  Awake, in nad  Cardiovascular: regular, s1, s2  Respiratory: normal resp effort, no audible wheezing  Abdomen: soft,  nontender  Musculoskeletal: postoperative dressings over LLE, no clubbing   Data Reviewed: Basic Metabolic Panel:  Recent Labs Lab 07/09/12 2310 07/10/12 0540  NA 135 137  K 3.6 3.4*  CL 97 99  CO2 26 24  GLUCOSE 170* 119*  BUN 92* 92*  CREATININE 4.17* 4.04*  CALCIUM 8.7 8.7   Liver Function Tests: No results found for this basename: AST, ALT, ALKPHOS, BILITOT, PROT, ALBUMIN,  in the last 168 hours No results found for this basename: LIPASE, AMYLASE,  in the last 168 hours No results found for this basename: AMMONIA,  in the last 168 hours CBC:  Recent Labs Lab 07/09/12 2310 07/10/12 0540  WBC 12.6* 15.2*  NEUTROABS 10.6*  --   HGB 11.4* 11.2*  HCT 34.8* 35.0*  MCV 88.1 87.9  PLT 140* 126*   Cardiac Enzymes: No results found for this basename: CKTOTAL, CKMB, CKMBINDEX, TROPONINI,  in the last 168 hours BNP (last 3 results)  Recent Labs  12/30/11 1057 03/12/12 0449 03/15/12 0440  PROBNP 534.0* 14215.0* 30340.0*   CBG:  Recent Labs Lab 07/10/12 0348 07/10/12 0825  GLUCAP 115* 110*    Recent Results (from the past 240 hour(s))  SURGICAL PCR SCREEN     Status: None   Collection Time    07/10/12  2:42 AM      Result Value Range Status   MRSA, PCR NEGATIVE  NEGATIVE Final   Staphylococcus aureus  NEGATIVE  NEGATIVE Final   Comment:            The Xpert SA Assay (FDA     approved for NASAL specimens     in patients over 80 years of age),     is one component of     a comprehensive surveillance     program.  Test performance has     been validated by [employer] for patients greater     than or equal to 67 year old.     It is not intended     to diagnose infection nor to     guide or monitor treatment.     Studies: Dg Pelvis 1-2 Views  07/09/2012   *RADIOLOGY REPORT*  Clinical Data: Left pelvic pain following a fall tonight.  PELVIS - 1-2 VIEW  Comparison: Abdomen and pelvis CT dated 08/09/2011.  Findings: Again demonstrated is a left total  hip prosthesis with thinning and deformity of the acetabulum.  No fracture or dislocation seen.  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  No acute abnormality.   Original Report Authenticated By: Beckie Salts, M.D.   Dg Femur Left  07/09/2012   *RADIOLOGY REPORT*  Clinical Data: Left leg pain following a fall tonight.  LEFT FEMUR - 2 VIEW  Comparison: Left knee radiographs and CT dated 03/30/2009 and 03/31/2009 respectively.  Pelvis radiograph obtained today.  Findings: Left total hip prosthesis with the previously described chronic acetabular thinning and deformity.  Mildly impacted comminuted fracture of the distal femoral metaphysis without significant displacement or angulation.  Possible fracture of the posterior aspect of one of the femoral condyles, proximally. Diffuse soft tissue swelling at the level of the knee, most pronounced anteriorly.  Small knee joint effusion.  IMPRESSION:  1.  Comminuted, mildly impacted fracture of the distal femoral metaphysis and a possible fracture of the posterior aspect of one of the femoral condyles, proximally. 2.  Small effusion.   Original Report Authenticated By: Beckie Salts, M.D.   Dg Knee 1-2 Views Left  07/09/2012   *RADIOLOGY REPORT*  Clinical Data: Pain post fall.  LEFT KNEE - 1-2 VIEW  Comparison: 03/30/2009  Findings: Transverse comminuted fracture across the distal femoral metadiaphyseal region, impacted with several small fracture fragments.  No definite involvement of the femoral condyles on these two radiographs.  Probable depressed fracture of the lateral tibial plateau.  IMPRESSION: 1. Transverse comminuted fracture, distal femur. 2.  Probable depressed lateral tibial plateau fracture.   Original Report Authenticated By: D. Andria Rhein, MD   Ct Knee Left Wo Contrast  07/10/2012   *RADIOLOGY REPORT*  Clinical Data: Pain post fall.  CT OF THE LEFT KNEE WITHOUT CONTRAST  Technique:  Multidetector CT  imaging was performed according to the standard protocol. Multiplanar CT image reconstructions were also generated.  Comparison: Radiographs from the previous day, and earlier studies  Findings: Comminuted fracture of the distal femoral metadiaphyseal region extending to involve the anterior subchondral cortex of both femoral condyles, with a fracture   component in the sagittal plane extending to the intercondylar notch.  There is impaction of the proximal shaft onto the fracture fragments, with anterior angulation of the distal fracture fragment complex.  There is an old avulsion fracture deformity of the posterior proximal tibia at the level of the PCL.  No new tibial plateau fracture is evident. Hemarthrosis is evident as well as regional soft tissue swelling.  IMPRESSION:  1.  Comminuted intra-articular distal femur fracture as above. 2.  Chronic fracture deformity of the posterior tibia at the PCL insertion.   Original Report Authenticated By: D. Andria Rhein, MD   Dg Chest Port 1 View  07/10/2012   *RADIOLOGY REPORT*  Clinical Data: Preoperative chest x-ray  PORTABLE CHEST - 1 VIEW  Comparison: Prior chest x-ray 03/13/2012  Findings: Stable cardiomegaly.  No pulmonary edema, there is been reduced pulmonary vascular congestion compared to the prior study. No pneumothorax or pleural effusion.  Nonspecific patchy opacity in the periphery of the right lung.  No acute osseous abnormality.  IMPRESSION:  1.  Nonspecific patchy opacity in the right mid lung may reflect atelectasis, underlying parenchymal scarring or in the appropriate clinical setting, and early infiltrate.  Recommend dedicated PA and lateral chest x-ray to further evaluate.  2.  Stable cardiomegaly without evidence of edema.   Original Report Authenticated By: Malachy Moan, M.D.    Scheduled Meds: .  ceFAZolin (ANCEF) IV  3 g Intravenous On Call to OR  . chlorhexidine  60 mL Topical Once  . insulin aspart  0-9 Units Subcutaneous Q4H  .  metoprolol  2.5 mg Intravenous Q6H  . sirolimus  1.2 mg Oral Daily   Continuous Infusions: . sodium chloride    . sodium chloride      Principal Problem:   Fracture, femur, distal Active Problems:   Cardiomyopathy   Chronic systolic CHF (congestive heart failure)   CKD (chronic kidney disease)   Hypertension   PAF (paroxysmal atrial fibrillation)    Time spent:    Jini Horiuchi K  [name] Pager [phone]. If 7PM-7AM, please contact night-coverage at [email or web address], password [facility] 07/10/2012, 10:02 AM  LOS: 1 day

## 2012-07-10 NOTE — Brief Op Note (Signed)
07/09/2012 - 07/10/2012  1:24 PM  PATIENT:  Chad [name]  57 y.o. male  PRE-OPERATIVE DIAGNOSIS:  Left intra-articular distal femur fracture  POST-OPERATIVE DIAGNOSIS:  same  Procedure(s):  Open reduction and internal fixation of left distal femur fracture  SURGEON:  Toni Arthurs, MD  ASSISTANT: n/a  ANESTHESIA:   General  EBL:  250 cc   TOURNIQUET:  n/a  COMPLICATIONS:  None apparent  DISPOSITION:  Extubated, awake and stable to recovery.  DICTATION ID:  [id number]

## 2012-07-11 ENCOUNTER — Inpatient Hospital Stay (HOSPITAL_COMMUNITY): Payer: Medicaid Other

## 2012-07-11 LAB — CBC WITH DIFFERENTIAL/PLATELET
Basophils Absolute: 0.1 10*3/uL (ref 0.0–0.1)
Basophils Relative: 0 % (ref 0–1)
Eosinophils Absolute: 0.3 10*3/uL (ref 0.0–0.7)
Eosinophils Relative: 2 % (ref 0–5)
HCT: 26.3 % — ABNORMAL LOW (ref 39.0–52.0)
Lymphocytes Relative: 7 % — ABNORMAL LOW (ref 12–46)
MCH: 28.3 pg (ref 26.0–34.0)
MCHC: 31.6 g/dL (ref 30.0–36.0)
MCV: 89.8 fL (ref 78.0–100.0)
Monocytes Absolute: 3 10*3/uL — ABNORMAL HIGH (ref 0.1–1.0)
Platelets: 113 10*3/uL — ABNORMAL LOW (ref 150–400)
RDW: 18 % — ABNORMAL HIGH (ref 11.5–15.5)
WBC: 18.2 10*3/uL — ABNORMAL HIGH (ref 4.0–10.5)

## 2012-07-11 LAB — GLUCOSE, CAPILLARY
Glucose-Capillary: 106 mg/dL — ABNORMAL HIGH (ref 70–99)
Glucose-Capillary: 109 mg/dL — ABNORMAL HIGH (ref 70–99)
Glucose-Capillary: 131 mg/dL — ABNORMAL HIGH (ref 70–99)
Glucose-Capillary: 104 mg/dL — ABNORMAL HIGH (ref 70–99)
Glucose-Capillary: 145 mg/dL — ABNORMAL HIGH (ref 70–99)

## 2012-07-11 LAB — COMPREHENSIVE METABOLIC PANEL
ALT: 7 U/L (ref 0–53)
AST: 21 U/L (ref 0–37)
Albumin: 2.6 g/dL — ABNORMAL LOW (ref 3.5–5.2)
Calcium: 8 mg/dL — ABNORMAL LOW (ref 8.4–10.5)
Creatinine, Ser: 3.83 mg/dL — ABNORMAL HIGH (ref 0.50–1.35)
GFR calc non Af Amer: 16 mL/min — ABNORMAL LOW (ref >90)
Sodium: 135 mEq/L (ref 135–145)
Total Protein: 5.7 g/dL — ABNORMAL LOW (ref 6.0–8.3)

## 2012-07-11 MED ORDER — PREDNISONE 10 MG PO TABS
10.0000 mg | ORAL_TABLET | Freq: Every day | ORAL | Status: DC
  Administered 2012-07-11 – 2012-07-15 (×5): 10 mg via ORAL
  Filled 2012-07-11 (×8): qty 1

## 2012-07-11 MED ORDER — AMIODARONE HCL 200 MG PO TABS
200.0000 mg | ORAL_TABLET | Freq: Every day | ORAL | Status: DC
  Administered 2012-07-11 – 2012-07-15 (×5): 200 mg via ORAL
  Filled 2012-07-11 (×5): qty 1

## 2012-07-11 MED ORDER — FUROSEMIDE 10 MG/ML IJ SOLN
40.0000 mg | INTRAMUSCULAR | Status: AC
  Administered 2012-07-11: 40 mg via INTRAVENOUS

## 2012-07-11 MED ORDER — CARVEDILOL 25 MG PO TABS
25.0000 mg | ORAL_TABLET | Freq: Two times a day (BID) | ORAL | Status: DC
  Administered 2012-07-11 – 2012-07-15 (×8): 25 mg via ORAL
  Filled 2012-07-11 (×10): qty 1

## 2012-07-11 MED ORDER — INSULIN ASPART 100 UNIT/ML ~~LOC~~ SOLN
0.0000 [IU] | Freq: Three times a day (TID) | SUBCUTANEOUS | Status: DC
  Administered 2012-07-11 – 2012-07-12 (×3): 1 [IU] via SUBCUTANEOUS
  Administered 2012-07-12: 2 [IU] via SUBCUTANEOUS
  Administered 2012-07-13: 3 [IU] via SUBCUTANEOUS
  Administered 2012-07-13 (×2): 1 [IU] via SUBCUTANEOUS
  Administered 2012-07-13: 2 [IU] via SUBCUTANEOUS
  Administered 2012-07-14 – 2012-07-15 (×5): 1 [IU] via SUBCUTANEOUS

## 2012-07-11 MED ORDER — FUROSEMIDE 80 MG PO TABS: 80.0000 mg | ORAL_TABLET | Freq: Two times a day (BID) | ORAL | Status: DC

## 2012-07-11 MED ORDER — FUROSEMIDE 80 MG PO TABS
80.0000 mg | ORAL_TABLET | Freq: Two times a day (BID) | ORAL | Status: DC
  Administered 2012-07-12 – 2012-07-15 (×7): 80 mg via ORAL
  Filled 2012-07-11 (×11): qty 1

## 2012-07-11 MED ORDER — ATORVASTATIN CALCIUM 20 MG PO TABS
20.0000 mg | ORAL_TABLET | Freq: Every day | ORAL | Status: DC
  Administered 2012-07-11 – 2012-07-14 (×4): 20 mg via ORAL
  Filled 2012-07-11 (×6): qty 1

## 2012-07-11 MED ORDER — ISOSORBIDE MONONITRATE ER 60 MG PO TB24
60.0000 mg | ORAL_TABLET | Freq: Every day | ORAL | Status: DC
  Administered 2012-07-11 – 2012-07-15 (×5): 60 mg via ORAL
  Filled 2012-07-11 (×5): qty 1

## 2012-07-11 MED ORDER — ASPIRIN EC 81 MG PO TBEC
81.0000 mg | DELAYED_RELEASE_TABLET | Freq: Every day | ORAL | Status: DC
  Administered 2012-07-11 – 2012-07-15 (×5): 81 mg via ORAL
  Filled 2012-07-11 (×5): qty 1

## 2012-07-11 MED ORDER — POTASSIUM CHLORIDE CRYS ER 20 MEQ PO TBCR
EXTENDED_RELEASE_TABLET | ORAL | Status: AC
  Filled 2012-07-11: qty 1

## 2012-07-11 MED ORDER — HYDRALAZINE HCL 50 MG PO TABS
75.0000 mg | ORAL_TABLET | Freq: Three times a day (TID) | ORAL | Status: DC
  Administered 2012-07-11 – 2012-07-12 (×3): 75 mg via ORAL
  Filled 2012-07-11 (×6): qty 1

## 2012-07-11 MED ORDER — FUROSEMIDE 10 MG/ML IJ SOLN
INTRAMUSCULAR | Status: AC
  Filled 2012-07-11: qty 4

## 2012-07-11 MED ORDER — ALLOPURINOL 100 MG PO TABS
100.0000 mg | ORAL_TABLET | Freq: Two times a day (BID) | ORAL | Status: DC
  Administered 2012-07-11 – 2012-07-15 (×8): 100 mg via ORAL
  Filled 2012-07-11 (×10): qty 1

## 2012-07-11 MED ORDER — FUROSEMIDE 10 MG/ML IJ SOLN: 20.0000 mg | INTRAMUSCULAR | Status: DC

## 2012-07-11 MED ORDER — SIMVASTATIN 40 MG PO TABS: 40.0000 mg | ORAL_TABLET | Freq: Every day | ORAL | Status: DC

## 2012-07-11 MED ORDER — POTASSIUM CHLORIDE CRYS ER 10 MEQ PO TBCR
10.0000 meq | EXTENDED_RELEASE_TABLET | Freq: Every day | ORAL | Status: DC
  Administered 2012-07-11 – 2012-07-15 (×5): 10 meq via ORAL
  Filled 2012-07-11 (×5): qty 1

## 2012-07-11 NOTE — Evaluation (Signed)
Occupational Therapy Evaluation Patient Details Name: Chad [name] MRN: [id number] DOB: 12-25-1955 Today's Date: 07/11/2012 Time: 4098-1191 OT Time Calculation (min): 45 min  OT Assessment / Plan / Recommendation Clinical Impression  Pt admitted with left distal femur fx s/p fall at home. Now s/p left IM femoral nail.  PMH of renal transplant (in 1996). Will continue to follow acutely in order to address below problem list. Recommending CIR to further progress rehab before eventual return home.    OT Assessment  Patient needs continued OT Services    Follow Up Recommendations  CIR    Barriers to Discharge Decreased caregiver support PRN assist from family/friends.  Equipment Recommendations  None recommended by OT    Recommendations for Other Services Rehab consult  Frequency  Min 2X/week    Precautions / Restrictions Precautions Precautions: Fall Restrictions Weight Bearing Restrictions: Yes LLE Weight Bearing: Non weight bearing (TDWB per latest MD note; will clarify)   Pertinent Vitals/Pain See vitals    ADL  Eating/Feeding: Performed;Independent Where Assessed - Eating/Feeding: Bed level Grooming: Performed;Wash/dry face;Set up Where Assessed - Grooming: Supine, head of bed up Upper Body Bathing: Simulated;Supervision/safety Where Assessed - Upper Body Bathing: Unsupported sitting Lower Body Bathing: Simulated;Maximal assistance Where Assessed - Lower Body Bathing: Unsupported sitting Upper Body Dressing: Simulated;Set up Where Assessed - Upper Body Dressing: Unsupported sitting Lower Body Dressing: Performed;+1 Total assistance Where Assessed - Lower Body Dressing: Unsupported sitting Equipment Used:  (LLE hinged brace) Transfers/Ambulation Related to ADLs: Pt unable to tolerate OOB transfer this session due to pain. ADL Comments: Pt sat EOB ~15 minutes. Increased pain with transitional movements to get to EOB and while sitting EOB. Attempted squat pivot from  bed to chair but pt too fatigued/painful and unable to push through RLE and use bil UEs for lift off.  OT/PT assisted pt with returning to supine in bed. Elevated LLE on pillows and appliced ice to assist in edema control and pain management.    OT Diagnosis: Generalized weakness;Acute pain  OT Problem List: Decreased strength;Decreased activity tolerance;Impaired balance (sitting and/or standing);Decreased knowledge of use of DME or AE;Decreased knowledge of precautions;Pain;Obesity OT Treatment Interventions: Self-care/ADL training;DME and/or AE instruction;Therapeutic activities;Patient/family education;Balance training   OT Goals Acute Rehab OT Goals OT Goal Formulation: With patient Time For Goal Achievement: 07/25/12 Potential to Achieve Goals: Good ADL Goals Pt Will Perform Lower Body Bathing: with mod assist;Sitting, chair;Sitting, edge of bed;with adaptive equipment ADL Goal: Lower Body Bathing - Progress: Goal set today Pt Will Transfer to Toilet: with max assist;Squat pivot transfer;with DME;Extra wide 3-in-1;Drop arm 3-in-1;Maintaining weight bearing status ADL Goal: Toilet Transfer - Progress: Goal set today Miscellaneous OT Goals Miscellaneous OT Goal #1: Pt will perform bed mobility with min assist as precursor for EOB ADLs. OT Goal: Miscellaneous Goal #1 - Progress: Goal set today  Visit Information  Last OT Received On: 07/11/12 Assistance Needed: +2 PT/OT Co-Evaluation/Treatment: Yes    Subjective Data      Prior Functioning     Home Living Lives With: Alone Available Help at Discharge: Friend(s);Available PRN/intermittently Type of Home: House Home Access: Stairs to enter [employer] of Steps: 8 Entrance Stairs-Rails: Left Home Layout: Two level;Able to live on main level with bedroom/bathroom Bathroom Shower/Tub: Health visitor: Handicapped height Home Adaptive Equipment: Bedside commode/3-in-1;Straight cane;Walker -  rolling Prior Function Level of Independence: Independent Able to Take Stairs?: Yes Driving: Yes Communication Communication: No difficulties Dominant Hand: Right         Vision/Perception  Vision - History Patient Visual Report: No change from baseline   Cognition  Cognition Arousal/Alertness: Lethargic Behavior During Therapy: WFL for tasks assessed/performed Overall Cognitive Status: Within Functional Limits for tasks assessed    Extremity/Trunk Assessment Right Upper Extremity Assessment RUE ROM/Strength/Tone: [facility] for tasks assessed Left Upper Extremity Assessment LUE ROM/Strength/Tone: WFL for tasks assessed     Mobility Bed Mobility Bed Mobility: Supine to Sit;Sitting - Scoot to Edge of Bed;Sit to Supine;Scooting to [facility] Supine to Sit: 1: +2 Total assist;HOB flat;With rails Supine to Sit: Patient Percentage: 30% Sitting - Scoot to Edge of Bed: 1: +2 Total assist Sitting - Scoot to Edge of Bed: Patient Percentage: 30% Sit to Supine: 1: +2 Total assist;HOB flat;With rail Sit to Supine: Patient Percentage: 30% Scooting to HOB: 3: Mod assist;With rail Details for Bed Mobility Assistance: Pt able to use bil UEs to pull on rails and RLE to push through bed surface in order to scoot to [facility].  +2 total assist to bring LEs into/off bed and to support trunk with transitional movements in/out of bed. VCs for step by step sequencing.      Exercise     Balance     End of Session OT - End of Session Activity Tolerance: Patient limited by fatigue;Patient limited by pain Patient left: in bed;with call bell/phone within reach Nurse Communication: Mobility status  GO   07/11/2012 Cipriano Mile OTR/L Pager [phone] Office [phone]   Cipriano Mile 07/11/2012, 3:05 PM

## 2012-07-11 NOTE — Consult Note (Signed)
Referring Provider: No ref. provider found Primary Care Physician:  [name], MD Primary Nephrologist:  Dr. 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 Patient status post renal transplant 1996. Chronic allograft nephropathy with baseline creatinine around 3.     Past Medical History  Diagnosis Date  . Hypertension   . Hepatitis C 1996  . CKD (chronic kidney disease), stage IV     a. s/p renal transplant 1996.  Marland Kitchen Pneumonia   . HLD (hyperlipidemia)   . Nonischemic cardiomyopathy     a. unknown etiology, EF 30-35% by echo 09/2011;  b. 09/2011 Lexi MV EF 42%, no ischemia/infarct.  . Systolic CHF   . Atrial flutter     a. in the setting of sepsis 05/2011; on amio; no anticoagulation    Past Surgical History  Procedure Laterality Date  . Nephrectomy transplanted organ    . Insertion of dialysis catheter  06/20/2011    Procedure: INSERTION OF DIALYSIS CATHETER;  Surgeon: [name], MD;  Location: [facility] OR;  Service: Vascular;  Laterality: Right;  Ultrasound guided insertion of right internal jugular dialysis catheter  . Multiple extractions with alveoloplasty  06/27/2011    Procedure: MULTIPLE EXTRACION WITH ALVEOLOPLASTY;  Surgeon: [name], DDS;  Location: [facility] OR;  Service: Oral Surgery;  Laterality: N/A;  Extraction  of tooth # 14 with alveoloplasty  . Hip surgery    . Joint replacement      left hip    Prior to Admission medications   Medication Sig Start Date End Date Taking? Authorizing Provider  allopurinol (ZYLOPRIM) 100 MG tablet Take 200 mg by mouth 2 (two) times daily.   Yes Historical Provider, MD  amiodarone (PACERONE) 200 MG tablet Take 200 mg by mouth daily. 07/16/11 07/15/12 Yes Daniel  J Angiulli, PA-C  aspirin EC 81 MG tablet Take 81 mg by mouth daily.   Yes Historical Provider, MD  carvedilol (COREG) 25 MG tablet Take 25 mg by mouth 2 (two) times daily with a meal.   Yes Historical Provider, MD  furosemide (LASIX) 80 MG tablet Take 1 tablet (80 mg total) by mouth 2 (two) times daily. 03/16/12  Yes [name], MD  hydrALAZINE (APRESOLINE) 50 MG tablet Take 75 mg by mouth 3 (three) times daily. Takes 1.5 tablets   Yes Historical Provider, MD  HYDROcodone-acetaminophen (NORCO/VICODIN) 5-325 MG per tablet Take 1-2 tablets by mouth every 4 (four) hours as needed for pain. 03/16/12  Yes [name], MD  isosorbide mononitrate (IMDUR) 60 MG 24 hr tablet Take 1 tablet (60 mg total) by mouth daily. 03/16/12  Yes [name], MD  omeprazole (PRILOSEC) 20 MG capsule Take 20 mg by mouth daily.  10/28/11  Yes Historical Provider, MD  potassium chloride (K-DUR,KLOR-CON) 10 MEQ tablet Take 10 mEq by mouth daily.   Yes Historical Provider, MD  pravastatin (PRAVACHOL) 40 MG tablet Take 40 mg by mouth daily.   Yes Historical Provider, MD  predniSONE (DELTASONE) 10 MG tablet Take 10 mg by mouth daily.   Yes Historical Provider, MD  sirolimus (RAPAMUNE) 1 MG/ML solution Take 1.2 mg by mouth daily.   Yes Historical Provider, MD    Current Facility-Administered Medications  Medication Dose Route Frequency Provider Last Rate Last Dose  . 0.9 %  sodium chloride infusion   Intravenous Continuous Toni Arthurs, MD 20 mL/hr at 07/11/12 0143    . acetaminophen (TYLENOL) tablet 650 mg  650 mg Oral Q6H PRN Ron Parker, MD   650 mg at 07/11/12 0617   Or  . acetaminophen (TYLENOL) suppository 650 mg  650 mg Rectal Q6H PRN Ron Parker, MD      . amiodarone (PACERONE) tablet 200 mg  200 mg Oral Daily Jerald Kief, MD      . aspirin EC tablet 81 mg  81 mg Oral Daily Jerald Kief, MD      . atorvastatin (LIPITOR) tablet 20 mg  20 mg Oral q1800 Jerald Kief, MD      . carvedilol (COREG) tablet 25 mg  25 mg Oral BID WC Jerald Kief, MD      . docusate sodium (COLACE) capsule 100 mg  100 mg Oral BID Toni Arthurs, MD   100 mg at 07/11/12 0857  . enoxaparin (LOVENOX) injection 30 mg  30 mg Subcutaneous Q24H Toni Arthurs, MD   30 mg at 07/10/12 1705  . hydrALAZINE (APRESOLINE) injection 10 mg  10 mg Intravenous Q6H PRN Ron Parker, MD      . hydrALAZINE (APRESOLINE) tablet 75 mg  75 mg Oral TID Jerald Kief, MD      . HYDROmorphone (DILAUDID) injection 0.5-1 mg  0.5-1 mg Intravenous Q2H PRN Toni Arthurs, MD   1 mg at 07/11/12 0522  . insulin aspart (novoLOG) injection 0-9 Units  0-9 Units Subcutaneous TID AC & HS Toni Arthurs, MD      . isosorbide mononitrate (IMDUR) 24 hr tablet 60 mg  60 mg Oral Daily Jerald Kief, MD      . ondansetron [facility]) tablet 4 mg  4 mg Oral Q6H PRN Ron Parker, MD       Or  . ondansetron (ZOFRAN) injection 4 mg  4 mg Intravenous Q6H PRN Ron Parker, MD   4 mg at 07/11/12 0536  . oxyCODONE (Oxy IR/ROXICODONE) immediate release tablet 5-10 mg  5-10 mg Oral Q3H PRN Toni Arthurs, MD   10 mg at 07/11/12 0858  . predniSONE (DELTASONE) tablet 10 mg  10 mg Oral Daily Jerald Kief, MD      . senna [facility]) tablet 17.2 mg  2 tablet Oral BID Toni Arthurs, MD   17.2 mg at 07/11/12 0858  . sirolimus (RAPAMUNE) 1 MG/ML solution 1.2 mg  1.2 mg Oral Daily Jerald Kief, MD   1.2 mg at 07/11/12 0020  . zolpidem (AMBIEN) tablet 5 mg  5 mg Oral QHS PRN Ron Parker, MD        Allergies as of 07/09/2012  . (No Known Allergies)    Family History  Problem Relation Age of Onset  . Heart disease Neg Hx     History   Social History  . Marital Status: Single    Spouse Name: N/A    Number of Children: N/A  . Years of Education: N/A   Occupational History  . Not on file.   Social History Main Topics  . Smoking status: Never Smoker   . Smokeless tobacco: Never Used  . Alcohol Use: No  . Drug  Use: No  . Sexually Active: No   Other Topics Concern  . Not on file   Social History Narrative  .  No narrative on file    Review of Systems: Gen: Denies any fever, chills, sweats, anorexia, fatigue, weakness, malaise, weight loss, and sleep disorder HEENT: No visual complaints, No history of Retinopathy. Normal external appearance No Epistaxis or Sore throat. No sinusitis.   CV: Denies chest pain, angina, palpitations, syncope, orthopnea, PND, peripheral edema, and claudication. Resp: Denies dyspnea at rest, dyspnea with exercise, cough, sputum, wheezing, coughing up blood, and pleurisy. Using oxygen GI: Denies vomiting blood, jaundice, and fecal incontinence.   Denies dysphagia or odynophagia. GU : no bowel movement since surgery MS: Knee discomfort post op day 1 Derm: Denies rash, itching, dry skin, hives, moles, warts, or unhealing ulcers.  Psych: Denies depression, anxiety, memory loss, suicidal ideation, hallucinations, paranoia, and confusion. Heme: Denies bruising, bleeding, and enlarged lymph nodes. Neuro: No headache.  No diplopia. No dysarthria.  No dysphasia.  No history of CVA.  No Seizures. No paresthesias.  No weakness. Endocrine No DM.  No Thyroid disease.  No Adrenal disease.  Physical Exam: Vital signs in last 24 hours: Temp:  [97.2 F (36.2 C)-101.1 F (38.4 C)] 101.1 F (38.4 C) (06/15 0550) Pulse Rate:  [87-91] 91 (06/15 0550) Resp:  [16-23] 16 (06/15 0550) BP: (130-167)/(58-100) 130/58 mmHg (06/15 0550) SpO2:  [95 %-100 %] 96 % (06/15 0550) Last BM Date: 07/09/12 General:   Alert,  Well-developed, well-nourished, pleasant and cooperative in NAD Head:  Normocephalic and atraumatic. Eyes:  Sclera clear, no icterus.   Conjunctiva pink. Ears:  Normal auditory acuity. Nose:  No deformity, discharge,  or lesions. Mouth:  No deformity or lesions, dentition normal. Neck:  Supple; no masses or thyromegaly. JVP not elevated Lungs:  Clear throughout to  auscultation.   No wheezes, crackles, or rhonchi. No acute distress. Heart:  Irregular rate and rhythm; 3/6 murmurs, no clicks, rubs,  or gallops. Abdomen:  Soft, nontender and nondistended. No masses, hepatosplenomegaly or hernias noted. Normal bowel sounds, without guarding, and without rebound.   Msk:  Left knee brace. Pulses:  No carotid, renal, femoral bruits. DP and PT symmetrical and equal Extremities:  Without clubbing or edema. Neurologic:  Alert and  oriented x4;  grossly normal neurologically. Skin:  Intact without significant lesions or rashes. Cervical Nodes:  No significant cervical adenopathy. Psych:  Alert and cooperative. Normal mood and affect.  Intake/Output from previous day: 06/14 0701 - 06/15 0700 In: 2230 [I.V.:1980; IV Piggyback:250] Out: 2225 [Urine:1975; Blood:250] Intake/Output this shift: Total I/O In: 600 [P.O.:600] Out: -   Lab Results:  Recent Labs  07/10/12 0540 07/10/12 1550 07/11/12 0525  WBC 15.2* 13.7* 18.2*  HGB 11.2* 9.4* 8.3*  HCT 35.0* 29.0* 26.3*  PLT 126* 114* 113*   BMET  Recent Labs  07/09/12 2310 07/10/12 0540 07/10/12 1550 07/11/12 0525  NA 135 137  --  135  K 3.6 3.4*  --  3.3*  CL 97 99  --  99  CO2 26 24  --  25  GLUCOSE 170* 119*  --  105*  BUN 92* 92*  --  79*  CREATININE 4.17* 4.04* 4.15* 3.83*  CALCIUM 8.7 8.7  --  8.0*   LFT  Recent Labs  07/11/12 0525  PROT 5.7*  ALBUMIN 2.6*  AST 21  ALT 7  ALKPHOS 48  BILITOT 0.3   PT/INR No results found for this basename: LABPROT, INR,  in the last 72 hours Hepatitis Panel No results found for this basename: HEPBSAG, HCVAB, HEPAIGM, HEPBIGM,  in the last 72 hours  Studies/Results: Dg Pelvis 1-2 Views  07/09/2012   *RADIOLOGY REPORT*  Clinical Data: Left pelvic pain following a fall tonight.  PELVIS - 1-2 VIEW  Comparison: Abdomen and pelvis CT dated 08/09/2011.  Findings: Again demonstrated is a left total hip prosthesis with thinning and deformity of the  acetabulum.  No fracture or dislocation seen.  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  No acute abnormality.   Original Report Authenticated By: Beckie Salts, M.D.   Dg Femur Left  07/10/2012   *RADIOLOGY REPORT*  Clinical Data: Open reduction and internal fixation of the left femur.  DG C-ARM 61-120 MIN,LEFT FEMUR - 2 VIEW  Comparison:  07/09/2012  Technique:  C-arm fluoroscopic images were obtained intraoperatively and submitted for postoperative interpretation. Please see the performing provider's procedural report for the fluoroscopy time utilized.  Findings: Intraoperative images were submitted.  Again noted is a left hip arthroplasty.  A lateral plate has been placed along the femur.  There are distal interlocking screws in the comminuted distal femur fracture. Knee appears to be located.  IMPRESSION: Plate and screw fixation of the comminuted distal femur fracture.   Original Report Authenticated By: Richarda Overlie, M.D.   Dg Femur Left  07/09/2012   *RADIOLOGY REPORT*  Clinical Data: Left leg pain following a fall tonight.  LEFT FEMUR - 2 VIEW  Comparison: Left knee radiographs and CT dated 03/30/2009 and 03/31/2009 respectively.  Pelvis radiograph obtained today.  Findings: Left total hip prosthesis with the previously described chronic acetabular thinning and deformity.  Mildly impacted comminuted fracture of the distal femoral metaphysis without significant displacement or angulation.  Possible fracture of the posterior aspect of one of the femoral condyles, proximally. Diffuse soft tissue swelling at the level of the knee, most pronounced anteriorly.  Small knee joint effusion.  IMPRESSION:  1.  Comminuted, mildly impacted fracture of the distal femoral metaphysis and a possible fracture of the posterior aspect of one of the femoral condyles, proximally. 2.  Small effusion.   Original Report Authenticated By: Beckie Salts, M.D.   Dg Knee 1-2 Views  Left  07/09/2012   *RADIOLOGY REPORT*  Clinical Data: Pain post fall.  LEFT KNEE - 1-2 VIEW  Comparison: 03/30/2009  Findings: Transverse comminuted fracture across the distal femoral metadiaphyseal region, impacted with several small fracture fragments.  No definite involvement of the femoral condyles on these two radiographs.  Probable depressed fracture of the lateral tibial plateau.  IMPRESSION: 1. Transverse comminuted fracture, distal femur. 2.  Probable depressed lateral tibial plateau fracture.   Original Report Authenticated By: D. Andria Rhein, MD   Ct Knee Left Wo Contrast  07/10/2012   *RADIOLOGY REPORT*  Clinical Data: Pain post fall.  CT OF THE LEFT KNEE WITHOUT CONTRAST  Technique:  Multidetector CT imaging was performed according to the standard protocol. Multiplanar CT image reconstructions were also generated.  Comparison: Radiographs from the previous day, and earlier studies  Findings: Comminuted fracture of the distal femoral metadiaphyseal region extending to involve the anterior subchondral cortex of both femoral condyles, with a fracture   component in the sagittal plane extending to the intercondylar notch.  There is impaction of the proximal shaft onto the fracture fragments, with anterior angulation of the distal fracture fragment complex.  There is an old avulsion fracture deformity of the posterior proximal tibia at the level of the PCL.  No new tibial plateau fracture is evident. Hemarthrosis is evident as well as regional soft tissue swelling.  IMPRESSION: 1.  Comminuted intra-articular distal femur fracture as above. 2.  Chronic fracture deformity of the posterior tibia at the PCL insertion.   Original Report Authenticated By: D. Andria Rhein, MD   Dg Chest Port 1 View  07/10/2012   *RADIOLOGY REPORT*  Clinical Data: Preoperative chest x-ray  PORTABLE CHEST - 1 VIEW  Comparison: Prior chest x-ray 03/13/2012  Findings: Stable cardiomegaly.  No pulmonary edema, there is been  reduced pulmonary vascular congestion compared to the prior study. No pneumothorax or pleural effusion.  Nonspecific patchy opacity in the periphery of the right lung.  No acute osseous abnormality.  IMPRESSION:  1.  Nonspecific patchy opacity in the right mid lung may reflect atelectasis, underlying parenchymal scarring or in the appropriate clinical setting, and early infiltrate.  Recommend dedicated PA and lateral chest x-ray to further evaluate.  2.  Stable cardiomegaly without evidence of edema.   Original Report Authenticated By: Malachy Moan, M.D.   Dg C-arm 61-120 Min  07/10/2012   *RADIOLOGY REPORT*  Clinical Data: Open reduction and internal fixation of the left femur.  DG C-ARM 61-120 MIN,LEFT FEMUR - 2 VIEW  Comparison:  07/09/2012  Technique:  C-arm fluoroscopic images were obtained intraoperatively and submitted for postoperative interpretation. Please see the performing provider's procedural report for the fluoroscopy time utilized.  Findings: Intraoperative images were submitted.  Again noted is a left hip arthroplasty.  A lateral plate has been placed along the femur.  There are distal interlocking screws in the comminuted distal femur fracture. Knee appears to be located.  IMPRESSION: Plate and screw fixation of the comminuted distal femur fracture.   Original Report Authenticated By: Richarda Overlie, M.D.    Assessment/Plan: NEVILLE WALSTON is a 57 y.o. male admitted with acute fracture of the distal femur and a chronic tibial plateau fracture. Known renal transplant with Chronic Allograft Nephropathy.  1.CKD 5 Status post renal transplant   Creatinine appears stable.  2. Immunosuppression  Sirolimus, using liquid form patient brought in med from home and prednisone  3.HTN  Appears controlled  4.Anemia  blood loss after surgery.  Will monitor 5.Hepatitis C 6. Cardiomyopathy  EF 30-35 % 7.AFib Chronic amiodarone, no anticoagulation 8. Continue to follow renal function . Avoid  nephrotoxins   LOS: 2 Arshia Spellman W @TODAY @10 :50 AM

## 2012-07-11 NOTE — Progress Notes (Signed)
[name] PROGRESS NOTE  Chad [name]:[id number] DOB: 13-Oct-1955 DOA: 07/09/2012 PCP: [name], MD  Assessment/Plan: 1. Fx of Left Distal Femur-  -Ortho: Dr. [name] following -S/p ORIF of L distal femur fx -Pain Control and knee Immobilizer per Ortho for now.  2. CKD and Renal Tx  -Monitor BUN.Cr  -Chart reviewed. Pt noted to have hx of stage 4 ckd with baseline Cr of around 3.5 as of 2/14.  -Continue Rappimmune Rx  -Cont hydration.  -Cr near baseline at 3.8 today  -Nephro consulted. Followed by Dr. Caryn Section -Will resume home meds, but will defer diuretics to Nephrology  3. HTN  - IV metoprolol and IV Hydralazine while NPO.  4. Cardiomyopathy/ and Systolic CHF  - Furosemide IV when needed and monitor for signs of fluid overload, currently compensated.  5. PAF  -Rate controlled. -Will resume home PO meds 6. Hyperglycemia  - SSI coverage PRN  7. Anemia  - due to chronic disease and Renal Disease, send anemia panel.  8. SCDs for DVT prophylaxis.    Code Status: Full Family Communication: Pt in room (indicate person spoken with, relationship, and if by phone, the number) Disposition Plan: Pending   Consultants:  Orthopedics  Nephrology  Procedures:  ORIF of L distal femur fracture on 07/10/12  Antibiotics:  Cefazolin x 1 dose perioperatively on 07/10/12  HPI/Subjective: Reports mild LLE pain. No other issues.  Objective: Filed Vitals:   07/10/12 1430 07/10/12 1500 07/10/12 2055 07/11/12 0550  BP: 143/94 152/80 136/88 130/58  Pulse: 87 88 88 91  Temp: 98 F (36.7 C) 98.1 F (36.7 C) 99.5 F (37.5 C) 101.1 F (38.4 C)  TempSrc:   Oral Oral  Resp: 20 18 17 16   SpO2: 95% 96% 99% 96%    Intake/Output Summary (Last 24 hours) at 07/11/12 1037 Last data filed at 07/11/12 9518  Gross per 24 hour  Intake   2780 ml  Output   2225 ml  Net    555 ml   There were no vitals filed for this visit.  Exam:   General:  Awake, in  nad  Cardiovascular: regular, s1, s2  Respiratory: normal resp effort, no wheezing  Abdomen: soft, nondistended  Musculoskeletal: perfused,no clubbing   Data Reviewed: Basic Metabolic Panel:  Recent Labs Lab 07/09/12 2310 07/10/12 0540 07/10/12 1550 07/11/12 0525  NA 135 137  --  135  K 3.6 3.4*  --  3.3*  CL 97 99  --  99  CO2 26 24  --  25  GLUCOSE 170* 119*  --  105*  BUN 92* 92*  --  79*  CREATININE 4.17* 4.04* 4.15* 3.83*  CALCIUM 8.7 8.7  --  8.0*   Liver Function Tests:  Recent Labs Lab 07/11/12 0525  AST 21  ALT 7  ALKPHOS 48  BILITOT 0.3  PROT 5.7*  ALBUMIN 2.6*   No results found for this basename: LIPASE, AMYLASE,  in the last 168 hours No results found for this basename: AMMONIA,  in the last 168 hours CBC:  Recent Labs Lab 07/09/12 2310 07/10/12 0540 07/10/12 1550 07/11/12 0525  WBC 12.6* 15.2* 13.7* 18.2*  NEUTROABS 10.6*  --   --  13.6*  HGB 11.4* 11.2* 9.4* 8.3*  HCT 34.8* 35.0* 29.0* 26.3*  MCV 88.1 87.9 89.0 89.8  PLT 140* 126* 114* 113*   Cardiac Enzymes: No results found for this basename: CKTOTAL, CKMB, CKMBINDEX, TROPONINI,  in the last 168 hours BNP (last 3 results)  Recent Labs  12/30/11 1057 03/12/12 0449 03/15/12 0440  PROBNP 534.0* 14215.0* 30340.0*   CBG:  Recent Labs Lab 07/10/12 1656 07/10/12 2128 07/11/12 0121 07/11/12 0548 07/11/12 0806  GLUCAP 107* 128* 106* 109* 131*    Recent Results (from the past 240 hour(s))  SURGICAL PCR SCREEN     Status: None   Collection Time    07/10/12  2:42 AM      Result Value Range Status   MRSA, PCR NEGATIVE  NEGATIVE Final   Staphylococcus aureus NEGATIVE  NEGATIVE Final   Comment:            The Xpert SA Assay (FDA     approved for NASAL specimens     in patients over 6 years of age),     is one component of     a comprehensive surveillance     program.  Test performance has     been validated by [employer] for patients greater     than or equal to  50 year old.     It is not intended     to diagnose infection nor to     guide or monitor treatment.     Studies: Dg Pelvis 1-2 Views  07/09/2012   *RADIOLOGY REPORT*  Clinical Data: Left pelvic pain following a fall tonight.  PELVIS - 1-2 VIEW  Comparison: Abdomen and pelvis CT dated 08/09/2011.  Findings: Again demonstrated is a left total hip prosthesis with thinning and deformity of the acetabulum.  No fracture or dislocation seen.  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  No acute abnormality.   Original Report Authenticated By: Beckie Salts, M.D.   Dg Femur Left  07/10/2012   *RADIOLOGY REPORT*  Clinical Data: Open reduction and internal fixation of the left femur.  DG C-ARM 61-120 MIN,LEFT FEMUR - 2 VIEW  Comparison:  07/09/2012  Technique:  C-arm fluoroscopic images were obtained intraoperatively and submitted for postoperative interpretation. Please see the performing provider's procedural report for the fluoroscopy time utilized.  Findings: Intraoperative images were submitted.  Again noted is a left hip arthroplasty.  A lateral plate has been placed along the femur.  There are distal interlocking screws in the comminuted distal femur fracture. Knee appears to be located.  IMPRESSION: Plate and screw fixation of the comminuted distal femur fracture.   Original Report Authenticated By: Richarda Overlie, M.D.   Dg Femur Left  07/09/2012   *RADIOLOGY REPORT*  Clinical Data: Left leg pain following a fall tonight.  LEFT FEMUR - 2 VIEW  Comparison: Left knee radiographs and CT dated 03/30/2009 and 03/31/2009 respectively.  Pelvis radiograph obtained today.  Findings: Left total hip prosthesis with the previously described chronic acetabular thinning and deformity.  Mildly impacted comminuted fracture of the distal femoral metaphysis without significant displacement or angulation.  Possible fracture of the posterior aspect of one of the femoral condyles,  proximally. Diffuse soft tissue swelling at the level of the knee, most pronounced anteriorly.  Small knee joint effusion.  IMPRESSION:  1.  Comminuted, mildly impacted fracture of the distal femoral metaphysis and a possible fracture of the posterior aspect of one of the femoral condyles, proximally. 2.  Small effusion.   Original Report Authenticated By: Beckie Salts, M.D.   Dg Knee 1-2 Views Left  07/09/2012   *RADIOLOGY REPORT*  Clinical Data: Pain post fall.  LEFT KNEE - 1-2 VIEW  Comparison: 03/30/2009  Findings: Transverse  comminuted fracture across the distal femoral metadiaphyseal region, impacted with several small fracture fragments.  No definite involvement of the femoral condyles on these two radiographs.  Probable depressed fracture of the lateral tibial plateau.  IMPRESSION: 1. Transverse comminuted fracture, distal femur. 2.  Probable depressed lateral tibial plateau fracture.   Original Report Authenticated By: D. Andria Rhein, MD   Ct Knee Left Wo Contrast  07/10/2012   *RADIOLOGY REPORT*  Clinical Data: Pain post fall.  CT OF THE LEFT KNEE WITHOUT CONTRAST  Technique:  Multidetector CT imaging was performed according to the standard protocol. Multiplanar CT image reconstructions were also generated.  Comparison: Radiographs from the previous day, and earlier studies  Findings: Comminuted fracture of the distal femoral metadiaphyseal region extending to involve the anterior subchondral cortex of both femoral condyles, with a fracture   component in the sagittal plane extending to the intercondylar notch.  There is impaction of the proximal shaft onto the fracture fragments, with anterior angulation of the distal fracture fragment complex.  There is an old avulsion fracture deformity of the posterior proximal tibia at the level of the PCL.  No new tibial plateau fracture is evident. Hemarthrosis is evident as well as regional soft tissue swelling.  IMPRESSION: 1.  Comminuted intra-articular  distal femur fracture as above. 2.  Chronic fracture deformity of the posterior tibia at the PCL insertion.   Original Report Authenticated By: D. Andria Rhein, MD   Dg Chest Port 1 View  07/10/2012   *RADIOLOGY REPORT*  Clinical Data: Preoperative chest x-ray  PORTABLE CHEST - 1 VIEW  Comparison: Prior chest x-ray 03/13/2012  Findings: Stable cardiomegaly.  No pulmonary edema, there is been reduced pulmonary vascular congestion compared to the prior study. No pneumothorax or pleural effusion.  Nonspecific patchy opacity in the periphery of the right lung.  No acute osseous abnormality.  IMPRESSION:  1.  Nonspecific patchy opacity in the right mid lung may reflect atelectasis, underlying parenchymal scarring or in the appropriate clinical setting, and early infiltrate.  Recommend dedicated PA and lateral chest x-ray to further evaluate.  2.  Stable cardiomegaly without evidence of edema.   Original Report Authenticated By: Malachy Moan, M.D.   Dg C-arm 61-120 Min  07/10/2012   *RADIOLOGY REPORT*  Clinical Data: Open reduction and internal fixation of the left femur.  DG C-ARM 61-120 MIN,LEFT FEMUR - 2 VIEW  Comparison:  07/09/2012  Technique:  C-arm fluoroscopic images were obtained intraoperatively and submitted for postoperative interpretation. Please see the performing provider's procedural report for the fluoroscopy time utilized.  Findings: Intraoperative images were submitted.  Again noted is a left hip arthroplasty.  A lateral plate has been placed along the femur.  There are distal interlocking screws in the comminuted distal femur fracture. Knee appears to be located.  IMPRESSION: Plate and screw fixation of the comminuted distal femur fracture.   Original Report Authenticated By: Richarda Overlie, M.D.    Scheduled Meds: . docusate sodium  100 mg Oral BID  . enoxaparin (LOVENOX) injection  30 mg Subcutaneous Q24H  . insulin aspart  0-9 Units Subcutaneous TID AC & HS  . metoprolol  2.5 mg  Intravenous Q6H  . senna  2 tablet Oral BID  . sirolimus  1.2 mg Oral Daily   Continuous Infusions: . sodium chloride 20 mL/hr at 07/11/12 0143    Principal Problem:   Fracture, femur, distal Active Problems:   Cardiomyopathy   Chronic systolic CHF (congestive heart failure)   CKD (chronic  kidney disease)   Hypertension   PAF (paroxysmal atrial fibrillation)    Time spent:    Joyceann Kruser K  [name] Pager [phone]. If 7PM-7AM, please contact night-coverage at [email or web address], password [facility] 07/11/2012, 10:37 AM  LOS: 2 days

## 2012-07-11 NOTE — Progress Notes (Signed)
Subjective: 1 Day Post-Op Procedure(s) (LRB): IM FEMORAL NAIL (Left) Patient reports pain as well controlled.  Tolerating PO's well. Denis SOB, CP , or calf pain.  Objective: Vital signs in last 24 hours: Temp:  [97.2 F (36.2 C)-101.1 F (38.4 C)] 101.1 F (38.4 C) (06/15 0550) Pulse Rate:  [87-91] 91 (06/15 0550) Resp:  [16-23] 16 (06/15 0550) BP: (130-167)/(58-100) 130/58 mmHg (06/15 0550) SpO2:  [95 %-100 %] 96 % (06/15 0550)  Intake/Output from previous day: 06/14 0701 - 06/15 0700 In: 2230 [I.V.:1980; IV Piggyback:250] Out: 2225 [Urine:1975; Blood:250] Intake/Output this shift: Total I/O In: 600 [P.O.:600] Out: -    Recent Labs  07/09/12 2310 07/10/12 0540 07/10/12 1550 07/11/12 0525  HGB 11.4* 11.2* 9.4* 8.3*    Recent Labs  07/10/12 1550 07/11/12 0525  WBC 13.7* 18.2*  RBC 3.26* 2.93*  HCT 29.0* 26.3*  PLT 114* 113*    Recent Labs  07/10/12 0540 07/10/12 1550 07/11/12 0525  NA 137  --  135  K 3.4*  --  3.3*  CL 99  --  99  CO2 24  --  25  BUN 92*  --  79*  CREATININE 4.04* 4.15* 3.83*  GLUCOSE 119*  --  105*  CALCIUM 8.7  --  8.0*   No results found for this basename: LABPT, INR,  in the last 72 hours  Alert and oriented x3. Left leg Dressing C/D/I. Brace in place. Left LE neurovascularly intact. Mild soreness in left femur region. Calf soft and non-tender.  Assessment/Plan: 1 Day Post-Op Procedure(s) (LRB): IM FEMORAL NAIL (Left) Start PT (toe touch weight bearing) Start Lovenox Today for DVT prophylaxis Change BS check to ACHS  Continue current care. Follow Medicine recommendations for Lopressor change from IV to PO.  Chad [name], Chad [name] 07/11/2012, 9:04 AM

## 2012-07-11 NOTE — Progress Notes (Signed)
Orthopedic Tech Progress Note Patient Details:  Chad [name] [birth year] [id number]  Patient ID: Chad [name], male   DOB: 1955-06-10, 57 y.o.   MRN: [id number]   Chad [name] 07/11/2012, 10:30 AMLeft hinged knee brace completed by bio-tech.

## 2012-07-11 NOTE — Progress Notes (Signed)
CSW attempted to assess pt for potential SNF placement.  CSW visited pt room and pt sleeping heavily at this time and did not arouse to knock on door or calling of name.  CSW to follow up to complete full psychosocial assessment.  Jacklynn Lewis, MSW, LCSWA  Clinical Social Work W/e coverage [phone]

## 2012-07-11 NOTE — Evaluation (Signed)
Physical Therapy Evaluation Patient Details Name: Chad [name] MRN: [id number] DOB: [birth year] Today's Date: 07/11/2012 Time: 4098-1191 PT Time Calculation (min): 42 min  PT Assessment / Plan / Recommendation Clinical Impression  Admitted with left distal femur fx, now s/p ORIF, NWBing, in Bledsoe brace; Presents with decr functional mobility; Will benefit from PT to maximize independence and safety with mobility, and to facilitate dc planning    PT Assessment  Patient needs continued PT services    Follow Up Recommendations  CIR    Does the patient have the potential to tolerate intense rehabilitation      Barriers to Discharge Decreased caregiver support Lives alone; Friends help prn    Equipment Recommendations  Rolling walker with 5" wheels;Wheelchair (measurements PT);Wheelchair cushion (measurements PT) (possibly drop-arm 3in1)    Recommendations for Other Services Rehab consult   Frequency Min 5X/week    Precautions / Restrictions Precautions Precautions: Fall Required Braces or Orthoses: Other Brace/Splint Other Brace/Splint: Bledsoe brace  Restrictions Weight Bearing Restrictions: Yes LLE Weight Bearing: Non weight bearing (TDWB per latest MD note; will clarify)   Pertinent Vitals/Pain 10+/10 pain LLE with attempts at transfers; Repositioned as comfortably as possible in the bed; Applied ice      Mobility  Bed Mobility Bed Mobility: Supine to Sit;Sitting - Scoot to Edge of Bed;Sit to Supine;Scooting to [facility] Supine to Sit: 1: +2 Total assist;HOB flat;With rails Supine to Sit: Patient Percentage: 30% Sitting - Scoot to Edge of Bed: 1: +2 Total assist Sitting - Scoot to Edge of Bed: Patient Percentage: 30% Sit to Supine: 1: +2 Total assist;HOB flat;With rail Sit to Supine: Patient Percentage: 30% Scooting to HOB: 3: Mod assist;With rail Details for Bed Mobility Assistance: Pt able to use bil UEs to pull on rails and RLE to push through bed surface in order to  scoot to [facility].  +2 total assist to bring LEs into/off bed and to support trunk with transitional movements in/out of bed. VCs for step by step sequencing.  Transfers Transfers: Not assessed Details for Transfer Assistance: Attempted basic stand pivot transfer, however pt unabl e to shift weight anteriorly and onto RLE, due to pain, and likely anxiety Ambulation/Gait Ambulation/Gait Assistance: Other (comment) (Unable)    Exercises     PT Diagnosis: Difficulty walking;Acute pain;Abnormality of gait;Generalized weakness  PT Problem List: Decreased strength;Decreased range of motion;Decreased activity tolerance;Decreased balance;Decreased mobility;Decreased coordination;Decreased knowledge of use of DME;Decreased safety awareness;Decreased knowledge of precautions;Pain PT Treatment Interventions: DME instruction;Gait training;Stair training;Functional mobility training;Therapeutic activities;Therapeutic exercise;Patient/family education;Wheelchair mobility training   PT Goals Acute Rehab PT Goals PT Goal Formulation: With patient Time For Goal Achievement: 07/25/12 Potential to Achieve Goals: Good Pt will go Supine/Side to Sit: with min assist PT Goal: Supine/Side to Sit - Progress: Goal set today Pt will go Sit to Supine/Side: with min assist PT Goal: Sit to Supine/Side - Progress: Goal set today Pt will go Sit to Stand: with min assist PT Goal: Sit to Stand - Progress: Goal set today Pt will go Stand to Sit: with min assist PT Goal: Stand to Sit - Progress: Goal set today Pt will Transfer Bed to Chair/Chair to Bed: with min assist PT Transfer Goal: Bed to Chair/Chair to Bed - Progress: Goal set today Pt will Ambulate: 1 - 15 feet;with min assist;with rolling walker PT Goal: Ambulate - Progress: Goal set today Pt will Go Up / Down Stairs: 3-5 stairs;with mod assist;with rolling walker PT Goal: Up/Down Stairs - Progress: Goal set today Pt  will Propel Wheelchair: > 150 feet;with modified  independence PT Goal: Propel Wheelchair - Progress: Goal set today  Visit Information  Last PT Received On: 07/11/12 Assistance Needed: +2 PT/OT Co-Evaluation/Treatment: Yes    Subjective Data  Subjective: Wants to work   Prior Functioning  Home Living Lives With: Alone Available Help at Discharge: Friend(s);Available PRN/intermittently Type of Home: House Home Access: Stairs to enter [employer] of Steps: 8 Entrance Stairs-Rails: Left Home Layout: Two level;Able to live on main level with bedroom/bathroom Bathroom Shower/Tub: Health visitor: Handicapped height Home Adaptive Equipment: Bedside commode/3-in-1;Straight cane;Walker - rolling Prior Function Level of Independence: Independent Able to Take Stairs?: Yes Driving: Yes Communication Communication: No difficulties Dominant Hand: Right    Cognition  Cognition Arousal/Alertness: Lethargic Behavior During Therapy: WFL for tasks assessed/performed Overall Cognitive Status: Within Functional Limits for tasks assessed    Extremity/Trunk Assessment Right Upper Extremity Assessment RUE ROM/Strength/Tone: [facility] for tasks assessed Left Upper Extremity Assessment LUE ROM/Strength/Tone: WFL for tasks assessed Right Lower Extremity Assessment RLE ROM/Strength/Tone: Deficits;Due to pain RLE ROM/Strength/Tone Deficits: Ntoed some strength deficits, with inability to shift weight onto RLE for standing -- this could be due to pain in LLE and anxiety over getting up Left Lower Extremity Assessment LLE ROM/Strength/Tone: Deficits;Due to pain LLE ROM/Strength/Tone Deficits: Locked in Bledsoe; Noted swelling LLE, positioned on pillows   Balance    End of Session PT - End of Session Equipment Utilized During Treatment: Gait belt Activity Tolerance: Patient limited by pain Patient left: in bed;with call bell/phone within reach Nurse Communication: Mobility status  GP     Van Clines [facility] [place], [place] [phone]  07/11/2012, 5:52 PM

## 2012-07-12 ENCOUNTER — Encounter (HOSPITAL_COMMUNITY): Payer: Self-pay | Admitting: General Practice

## 2012-07-12 DIAGNOSIS — S72409A Unspecified fracture of lower end of unspecified femur, initial encounter for closed fracture: Secondary | ICD-10-CM

## 2012-07-12 DIAGNOSIS — W19XXXA Unspecified fall, initial encounter: Secondary | ICD-10-CM

## 2012-07-12 DIAGNOSIS — S7290XA Unspecified fracture of unspecified femur, initial encounter for closed fracture: Secondary | ICD-10-CM

## 2012-07-12 LAB — GLUCOSE, CAPILLARY
Glucose-Capillary: 141 mg/dL — ABNORMAL HIGH (ref 70–99)
Glucose-Capillary: 171 mg/dL — ABNORMAL HIGH (ref 70–99)

## 2012-07-12 LAB — BASIC METABOLIC PANEL
BUN: 82 mg/dL — ABNORMAL HIGH (ref 6–23)
CO2: 23 mEq/L (ref 19–32)
Chloride: 97 mEq/L (ref 96–112)
Glucose, Bld: 135 mg/dL — ABNORMAL HIGH (ref 70–99)
Potassium: 3.8 mEq/L (ref 3.5–5.1)

## 2012-07-12 LAB — HIV ANTIBODY (ROUTINE TESTING W REFLEX): HIV: NONREACTIVE

## 2012-07-12 MED ORDER — FERUMOXYTOL INJECTION 510 MG/17 ML
510.0000 mg | Freq: Once | INTRAVENOUS | Status: AC
  Administered 2012-07-12: 510 mg via INTRAVENOUS
  Filled 2012-07-12: qty 17

## 2012-07-12 MED ORDER — DEXTROSE 5 % IV SOLN
1.0000 g | Freq: Three times a day (TID) | INTRAVENOUS | Status: DC
  Administered 2012-07-12: 1 g via INTRAVENOUS
  Filled 2012-07-12 (×3): qty 1

## 2012-07-12 MED ORDER — DARBEPOETIN ALFA-POLYSORBATE 100 MCG/0.5ML IJ SOLN
100.0000 ug | INTRAMUSCULAR | Status: DC
  Administered 2012-07-12: 100 ug via SUBCUTANEOUS
  Filled 2012-07-12: qty 0.5

## 2012-07-12 MED ORDER — VANCOMYCIN HCL 10 G IV SOLR
2000.0000 mg | Freq: Once | INTRAVENOUS | Status: AC
  Administered 2012-07-12: 2000 mg via INTRAVENOUS
  Filled 2012-07-12: qty 2000

## 2012-07-12 MED ORDER — HYDRALAZINE HCL 25 MG PO TABS
25.0000 mg | ORAL_TABLET | Freq: Three times a day (TID) | ORAL | Status: DC
  Administered 2012-07-12 – 2012-07-15 (×9): 25 mg via ORAL
  Filled 2012-07-12 (×11): qty 1

## 2012-07-12 MED ORDER — DEXTROSE 5 % IV SOLN
1.0000 g | INTRAVENOUS | Status: DC
  Administered 2012-07-13 – 2012-07-15 (×3): 1 g via INTRAVENOUS
  Filled 2012-07-12 (×3): qty 1

## 2012-07-12 MED ORDER — VANCOMYCIN HCL 10 G IV SOLR
1500.0000 mg | INTRAVENOUS | Status: DC
  Administered 2012-07-14: 1500 mg via INTRAVENOUS
  Filled 2012-07-12: qty 1500

## 2012-07-12 NOTE — Consult Note (Signed)
Physical Medicine and Rehabilitation Consult Reason for Consult: Left distal femur intra-articular fracture Referring Physician: [facility]   HPI: Chad [name] is a 57 y.o. right-handed male with history of hypertension, hepatitis C, chronic kidney disease with renal transplant 1996 with baseline creatinine around 3.00 and left hip replacement. Patient independent prior to admission living alone. Admitted 07/10/2012 after fall landing on his left knee. X-rays and imaging revealed left intra-articular distal femur fracture. Underwent ORIF of left distal femur fracture 07/10/2012 per Dr. [name]. Advised nonweightbearing left lower extremity with Bledsoe brace restrictions. Maintained on subcutaneous Lovenox for DVT prophylaxis. Postoperative anemia 8.3 and monitored. Physical and occupational therapy evaluations completed 07/11/2012 with recommendations of physical medicine rehabilitation consult to consider inpatient rehabilitation services.   Review of Systems  Cardiovascular: Positive for palpitations and leg swelling.  Musculoskeletal: Positive for myalgias.  All other systems reviewed and are negative.   Past Medical History  Diagnosis Date  . Hypertension   . Hepatitis C 1996  . CKD (chronic kidney disease), stage IV     a. s/p renal transplant 1996.  Marland Kitchen Pneumonia   . HLD (hyperlipidemia)   . Nonischemic cardiomyopathy     a. unknown etiology, EF 30-35% by echo 09/2011;  b. 09/2011 Lexi MV EF 42%, no ischemia/infarct.  . Systolic CHF   . Atrial flutter     a. in the setting of sepsis 05/2011; on amio; no anticoagulation   Past Surgical History  Procedure Laterality Date  . Nephrectomy transplanted organ    . Insertion of dialysis catheter  06/20/2011    Procedure: INSERTION OF DIALYSIS CATHETER;  Surgeon: [name], MD;  Location: [facility] OR;  Service: Vascular;  Laterality: Right;  Ultrasound guided insertion of right internal jugular dialysis catheter  . Multiple extractions with  alveoloplasty  06/27/2011    Procedure: MULTIPLE EXTRACION WITH ALVEOLOPLASTY;  Surgeon: [name], DDS;  Location: [facility] OR;  Service: Oral Surgery;  Laterality: N/A;  Extraction  of tooth # 14 with alveoloplasty  . Hip surgery    . Joint replacement      left hip   Family History  Problem Relation Age of Onset  . Heart disease Neg Hx    Social History:  reports that he has never smoked. He has never used smokeless tobacco. He reports that he does not drink alcohol or use illicit drugs. Allergies: No Known Allergies Medications Prior to Admission  Medication Sig Dispense Refill  . allopurinol (ZYLOPRIM) 100 MG tablet Take 200 mg by mouth 2 (two) times daily.      Marland Kitchen amiodarone (PACERONE) 200 MG tablet Take 200 mg by mouth daily.      Marland Kitchen aspirin EC 81 MG tablet Take 81 mg by mouth daily.      . carvedilol (COREG) 25 MG tablet Take 25 mg by mouth 2 (two) times daily with a meal.      . furosemide (LASIX) 80 MG tablet Take 1 tablet (80 mg total) by mouth 2 (two) times daily.  60 tablet  2  . hydrALAZINE (APRESOLINE) 50 MG tablet Take 75 mg by mouth 3 (three) times daily. Takes 1.5 tablets      . HYDROcodone-acetaminophen (NORCO/VICODIN) 5-325 MG per tablet Take 1-2 tablets by mouth every 4 (four) hours as needed for pain.      . isosorbide mononitrate (IMDUR) 60 MG 24 hr tablet Take 1 tablet (60 mg total) by mouth daily.  30 tablet  2  . omeprazole (PRILOSEC) 20 MG  capsule Take 20 mg by mouth daily.       . potassium chloride (K-DUR,KLOR-CON) 10 MEQ tablet Take 10 mEq by mouth daily.      . pravastatin (PRAVACHOL) 40 MG tablet Take 40 mg by mouth daily.      . predniSONE (DELTASONE) 10 MG tablet Take 10 mg by mouth daily.      . sirolimus (RAPAMUNE) 1 MG/ML solution Take 1.2 mg by mouth daily.        Home: Home Living Lives With: Alone Available Help at Discharge: Friend(s);Available PRN/intermittently Type of Home: House Home Access: Stairs to enter [employer] of  Steps: 8 Entrance Stairs-Rails: Left Home Layout: Two level;Able to live on main level with bedroom/bathroom Bathroom Shower/Tub: Health visitor: Handicapped height Home Adaptive Equipment: Bedside commode/3-in-1;Straight cane;Walker - rolling  Functional History: Prior Function Able to Take Stairs?: Yes Driving: Yes Functional Status:  Mobility: Bed Mobility Bed Mobility: Supine to Sit;Sitting - Scoot to Edge of Bed;Sit to Supine;Scooting to [facility] Supine to Sit: 1: +2 Total assist;HOB flat;With rails Supine to Sit: Patient Percentage: 30% Sitting - Scoot to Edge of Bed: 1: +2 Total assist Sitting - Scoot to Edge of Bed: Patient Percentage: 30% Sit to Supine: 1: +2 Total assist;HOB flat;With rail Sit to Supine: Patient Percentage: 30% Scooting to HOB: 3: Mod assist;With rail Transfers Transfers: Not assessed Ambulation/Gait Ambulation/Gait Assistance: Other (comment) (Unable)    ADL: ADL Eating/Feeding: Performed;Independent Where Assessed - Eating/Feeding: Bed level Grooming: Performed;Wash/dry face;Set up Where Assessed - Grooming: Supine, head of bed up Upper Body Bathing: Simulated;Supervision/safety Where Assessed - Upper Body Bathing: Unsupported sitting Lower Body Bathing: Simulated;Maximal assistance Where Assessed - Lower Body Bathing: Unsupported sitting Upper Body Dressing: Simulated;Set up Where Assessed - Upper Body Dressing: Unsupported sitting Lower Body Dressing: Performed;+1 Total assistance Where Assessed - Lower Body Dressing: Unsupported sitting Equipment Used:  (LLE hinged brace) Transfers/Ambulation Related to ADLs: Pt unable to tolerate OOB transfer this session due to pain. ADL Comments: Pt sat EOB ~15 minutes. Increased pain with transitional movements to get to EOB and while sitting EOB. Attempted squat pivot from bed to chair but pt too fatigued/painful and unable to push through RLE and use bil UEs for lift off.  OT/PT assisted pt  with returning to supine in bed. Elevated LLE on pillows and appliced ice to assist in edema control and pain management.  Cognition: Cognition Overall Cognitive Status: Within Functional Limits for tasks assessed Arousal/Alertness: Lethargic Orientation Level: Oriented X4 Cognition Arousal/Alertness: Lethargic Behavior During Therapy: WFL for tasks assessed/performed Overall Cognitive Status: Within Functional Limits for tasks assessed  Blood pressure 125/81, pulse 84, temperature 100.1 F (37.8 C), temperature source Oral, resp. rate 20, SpO2 97.00%. Physical Exam  Vitals reviewed. Constitutional: He is oriented to person, place, and time.  HENT:  Head: Normocephalic.  Eyes: EOM are normal.  Neck: Neck supple. No thyromegaly present.  Cardiovascular:  Cardiac rate controlled  Pulmonary/Chest:  Decreased breath sounds but clear to auscultation  Abdominal: Soft. Bowel sounds are normal. He exhibits no distension.  Neurological: He is alert and oriented to person, place, and time.  Patient follows full commands. Motor function within normal limits in UE's and RLE. Sensory function nl  Skin:  Surgical site is dressed. Left leg in KI/ appropriately tender  Psychiatric: He has a normal mood and affect. His behavior is normal. Judgment and thought content normal.    Results for orders placed during the hospital encounter of 07/09/12 (from the  past 24 hour(s))  GLUCOSE, CAPILLARY     Status: Abnormal   Collection Time    07/11/12  8:06 AM      Result Value Range   Glucose-Capillary 131 (*) 70 - 99 mg/dL   Comment 1 Notify RN     Comment 2 Documented in Chart    GLUCOSE, CAPILLARY     Status: Abnormal   Collection Time    07/11/12 11:16 AM      Result Value Range   Glucose-Capillary 104 (*) 70 - 99 mg/dL   Comment 1 Notify RN     Comment 2 Documented in Chart    GLUCOSE, CAPILLARY     Status: Abnormal   Collection Time    07/11/12  5:08 PM      Result Value Range    Glucose-Capillary 118 (*) 70 - 99 mg/dL  GLUCOSE, CAPILLARY     Status: Abnormal   Collection Time    07/11/12 10:40 PM      Result Value Range   Glucose-Capillary 145 (*) 70 - 99 mg/dL  BASIC METABOLIC PANEL     Status: Abnormal   Collection Time    07/12/12  4:40 AM      Result Value Range   Sodium 133 (*) 135 - 145 mEq/L   Potassium 3.8  3.5 - 5.1 mEq/L   Chloride 97  96 - 112 mEq/L   CO2 23  19 - 32 mEq/L   Glucose, Bld 135 (*) 70 - 99 mg/dL   BUN 82 (*) 6 - 23 mg/dL   Creatinine, Ser 0.10 (*) 0.50 - 1.35 mg/dL   Calcium 8.2 (*) 8.4 - 10.5 mg/dL   GFR calc non Af Amer 14 (*) >90 mL/min   GFR calc Af Amer 17 (*) >90 mL/min   Dg Femur Left  07/10/2012   *RADIOLOGY REPORT*  Clinical Data: Open reduction and internal fixation of the left femur.  DG C-ARM 61-120 MIN,LEFT FEMUR - 2 VIEW  Comparison:  07/09/2012  Technique:  C-arm fluoroscopic images were obtained intraoperatively and submitted for postoperative interpretation. Please see the performing provider's procedural report for the fluoroscopy time utilized.  Findings: Intraoperative images were submitted.  Again noted is a left hip arthroplasty.  A lateral plate has been placed along the femur.  There are distal interlocking screws in the comminuted distal femur fracture. Knee appears to be located.  IMPRESSION: Plate and screw fixation of the comminuted distal femur fracture.   Original Report Authenticated By: Richarda Overlie, M.D.   Dg Chest Port 1 View  07/11/2012   *RADIOLOGY REPORT*  Clinical Data: Follow-up CHF  PORTABLE CHEST - 1 VIEW  Comparison: 07/11/2012  Findings: Cardiac enlargement with mild vascular congestion. Negative for edema.  Mild bibasilar atelectasis unchanged.  No significant effusion.  IMPRESSION: Cardiac enlargement with mild vascular congestion.  Mild bibasilar atelectasis.  No significant change from prior study today.   Original Report Authenticated By: Janeece Riggers, M.D.   Dg Chest Port 1 View  07/11/2012    *RADIOLOGY REPORT*  Clinical Data: Hypoxia  PORTABLE CHEST - 1 VIEW  Comparison: 07/10/2012, 03/12/2012, and 12/09/2011  Findings: Stable cardiomegaly.  Pulmonary vascular congestion. Low lung volumes.  Hazy focal opacity the right lung base noted. Peripheral opacity left mid lung appears similar to prior chest radiographs and could reflect pleuroparenchymal scarring or pleural thickening.  IMPRESSION:  1.  Focal opacity at the right lung base.  This could reflect airspace disease, atelectasis, or scarring. 2.  Cardiomegaly with pulmonary vascular congestion.   Original Report Authenticated By: Britta Mccreedy, M.D.   Dg C-arm 61-120 Min  07/10/2012   *RADIOLOGY REPORT*  Clinical Data: Open reduction and internal fixation of the left femur.  DG C-ARM 61-120 MIN,LEFT FEMUR - 2 VIEW  Comparison:  07/09/2012  Technique:  C-arm fluoroscopic images were obtained intraoperatively and submitted for postoperative interpretation. Please see the performing provider's procedural report for the fluoroscopy time utilized.  Findings: Intraoperative images were submitted.  Again noted is a left hip arthroplasty.  A lateral plate has been placed along the femur.  There are distal interlocking screws in the comminuted distal femur fracture. Knee appears to be located.  IMPRESSION: Plate and screw fixation of the comminuted distal femur fracture.   Original Report Authenticated By: Richarda Overlie, M.D.    Assessment/Plan: Diagnosis: left distal femur fx after fall 1. Does the need for close, 24 hr/day medical supervision in concert with the patient's rehab needs make it unreasonable for this patient to be served in a less intensive setting? Potentially 2. Co-Morbidities requiring supervision/potential complications: paf, cm, chf, ckd 3. Due to bladder management, bowel management, safety, skin/wound care, disease management, medication administration, pain management and patient education, does the patient require 24 hr/day rehab  nursing? Potentially 4. Does the patient require coordinated care of a physician, rehab nurse, PT (1-2 hrs/day, 5 days/week) and OT (1-2 hrs/day, 5 days/week) to address physical and functional deficits in the context of the above medical diagnosis(es)? Potentially Addressing deficits in the following areas: balance, endurance, locomotion, strength, transferring, bowel/bladder control, bathing, dressing, feeding, grooming and toileting 5. Can the patient actively participate in an intensive therapy program of at least 3 hrs of therapy per day at least 5 days per week? Potentially 6. The potential for patient to make measurable gains while on inpatient rehab is good and fair 7. Anticipated functional outcomes upon discharge from inpatient rehab are supervision to min assist with PT, min assist with OT, n/a with SLP. 8. Estimated rehab length of stay to reach the above functional goals is: 2 weeks 9. Does the patient have adequate social supports to accommodate these discharge functional goals? No and Potentially 10. Anticipated D/C setting: Home 11. Anticipated post D/C treatments: HH therapy 12. Overall Rehab/Functional Prognosis: excellent  RECOMMENDATIONS: This patient's condition is appropriate for continued rehabilitative care in the following setting: CIR if social supports are in place to provide sup/min assist---otherwise will need SNF Patient has agreed to participate in recommended program. Yes Note that insurance prior authorization may be required for reimbursement for recommended care.  Comment: Rehab RN to follow up.   Ranelle Oyster, MD, [place] [facility]     07/12/2012

## 2012-07-12 NOTE — Progress Notes (Signed)
Subjective: 2 Days Post-Op Procedure(s) (LRB): IM FEMORAL NAIL (Left) Patient reports pain as mild.  Sat at side of bed with PT yesterday.  Objective: Vital signs in last 24 hours: Temp:  [98.7 F (37.1 C)-100.1 F (37.8 C)] 100.1 F (37.8 C) (06/16 0701) Pulse Rate:  [80-87] 84 (06/16 0701) Resp:  [16-20] 20 (06/16 0701) BP: (96-145)/(46-81) 125/81 mmHg (06/16 0701) SpO2:  [93 %-97 %] 97 % (06/16 0701) FiO2 (%):  [50 %] 50 % (06/15 2310)  Intake/Output from previous day: 06/15 0701 - 06/16 0700 In: 840 [P.O.:840] Out: 825 [Urine:825] Intake/Output this shift:     Recent Labs  07/09/12 2310 07/10/12 0540 07/10/12 1550 07/11/12 0525  HGB 11.4* 11.2* 9.4* 8.3*    Recent Labs  07/10/12 1550 07/11/12 0525  WBC 13.7* 18.2*  RBC 3.26* 2.93*  HCT 29.0* 26.3*  PLT 114* 113*    Recent Labs  07/11/12 0525 07/12/12 0440  NA 135 133*  K 3.3* 3.8  CL 99 97  CO2 25 23  BUN 79* 82*  CREATININE 3.83* 4.26*  GLUCOSE 105* 135*  CALCIUM 8.0* 8.2*   No results found for this basename: LABPT, INR,  in the last 72 hours  PE:  ROM brace in place.  Wound dressed and dry.  NVI at L LE.  Assessment/Plan: 2 Days Post-Op Procedure(s) (LRB): IM FEMORAL NAIL (Left) Up with therapy  TTWB on L LE.  Joelle Flessner 07/12/2012, 7:36 AM

## 2012-07-12 NOTE — Progress Notes (Signed)
[name] PROGRESS NOTE  Chad [name]:[id number] DOB: 04/11/1955 DOA: 07/09/2012 PCP: [name], MD  Assessment/Plan: 1. Fx of Left Distal Femur -Ortho: Dr. [name] following  -S/p ORIF of L distal femur fx  -Pain Control and knee Immobilizer per Ortho for now.  2. CKD and Renal Tx  -Monitor BUN.Cr  -Chart reviewed. Pt noted to have hx of stage 4 ckd with baseline Cr of around 56 as of 2/14.  -Continue Rappimmune Rx w/ prednisone  -Nephro following Followed by Dr. Caryn Section  -see events from yesterday. Pt suspected of pulm edema yesterday, requiring IV lasix. Home PO lasix resumed today. -Cr 4.2 today 3. HTN  - IV metoprolol and IV Hydralazine while NPO.  4. Cardiomyopathy/ and Systolic CHF  - Furosemide IV when needed and monitor for signs of fluid overload, currently compensated.  5. PAF  -Rate controlled.  -On home PO meds  6. Hyperglycemia  - SSI coverage PRN  7. Anemia  - due to chronic disease and Renal Disease, send anemia panel.  8. SCDs for DVT prophylaxis.  9. Fever - Leukocytosis today is possibly secondary to recent surgery, however, In the setting of recent hypoxia with fever in an immunocompromised state, will empirically cover for HCAP  Code Status: Full Family Communication: Pt in room (indicate person spoken with, relationship, and if by phone, the number) Disposition Plan: pending   Consultants:  Orthopedics  Nephrology  Procedures: ORIF of L distal femur fracture on 07/10/12  Antibiotics: Cefazolin x 1 dose perioperatively on 07/10/12 Vanocmycin 07/12/12 >>> Cefepime 07/12/12>>>  HPI/Subjective: Pt is without complaints  Objective: Filed Vitals:   07/11/12 1931 07/11/12 2300 07/11/12 2310 07/12/12 0701  BP: 96/46 102/59 102/59 125/81  Pulse: 80  83 84  Temp: 98.8 F (37.1 C)  98.7 F (37.1 C) 100.1 F (37.8 C)  TempSrc: Oral  Oral Oral  Resp: 18  16 20   SpO2: 95%  97% 97%    Intake/Output Summary (Last 24 hours) at  07/12/12 0842 Last data filed at 07/12/12 0300  Gross per 24 hour  Intake    240 ml  Output    825 ml  Net   -585 ml   There were no vitals filed for this visit.  Exam:   General:  Awake, in nad  Cardiovascular: regular, s1, s2  Respiratory: normal resp effort  Abdomen: soft, nondistended  Musculoskeletal: perfused, no clubbing   Data Reviewed: Basic Metabolic Panel:  Recent Labs Lab 07/09/12 2310 07/10/12 0540 07/10/12 1550 07/11/12 0525 07/12/12 0440  NA 135 137  --  135 133*  K 3.6 3.4*  --  3.3* 3.8  CL 97 99  --  99 97  CO2 26 24  --  25 23  GLUCOSE 170* 119*  --  105* 135*  BUN 92* 92*  --  79* 82*  CREATININE 4.17* 4.04* 4.15* 3.83* 4.26*  CALCIUM 8.7 8.7  --  8.0* 8.2*   Liver Function Tests:  Recent Labs Lab 07/11/12 0525  AST 21  ALT 7  ALKPHOS 48  BILITOT 0.3  PROT 5.7*  ALBUMIN 2.6*   No results found for this basename: LIPASE, AMYLASE,  in the last 168 hours No results found for this basename: AMMONIA,  in the last 168 hours CBC:  Recent Labs Lab 07/09/12 2310 07/10/12 0540 07/10/12 1550 07/11/12 0525  WBC 12.6* 15.2* 13.7* 18.2*  NEUTROABS 10.6*  --   --  13.6*  HGB 11.4* 11.2* 9.4* 8.3*  HCT  34.8* 35.0* 29.0* 26.3*  MCV 88.1 87.9 89.0 89.8  PLT 140* 126* 114* 113*   Cardiac Enzymes: No results found for this basename: CKTOTAL, CKMB, CKMBINDEX, TROPONINI,  in the last 168 hours BNP (last 3 results)  Recent Labs  12/30/11 1057 03/12/12 0449 03/15/12 0440  PROBNP 534.0* 14215.0* 30340.0*   CBG:  Recent Labs Lab 07/11/12 0806 07/11/12 1116 07/11/12 1708 07/11/12 2240 07/12/12 0705  GLUCAP 131* 104* 118* 145* 141*    Recent Results (from the past 240 hour(s))  SURGICAL PCR SCREEN     Status: None   Collection Time    07/10/12  2:42 AM      Result Value Range Status   MRSA, PCR NEGATIVE  NEGATIVE Final   Staphylococcus aureus NEGATIVE  NEGATIVE Final   Comment:            The Xpert SA Assay (FDA      approved for NASAL specimens     in patients over 56 years of age),     is one component of     a comprehensive surveillance     program.  Test performance has     been validated by [employer] for patients greater     than or equal to 66 year old.     It is not intended     to diagnose infection nor to     guide or monitor treatment.     Studies: Dg Femur Left  07/10/2012   *RADIOLOGY REPORT*  Clinical Data: Open reduction and internal fixation of the left femur.  DG C-ARM 61-120 MIN,LEFT FEMUR - 2 VIEW  Comparison:  07/09/2012  Technique:  C-arm fluoroscopic images were obtained intraoperatively and submitted for postoperative interpretation. Please see the performing provider's procedural report for the fluoroscopy time utilized.  Findings: Intraoperative images were submitted.  Again noted is a left hip arthroplasty.  A lateral plate has been placed along the femur.  There are distal interlocking screws in the comminuted distal femur fracture. Knee appears to be located.  IMPRESSION: Plate and screw fixation of the comminuted distal femur fracture.   Original Report Authenticated By: Richarda Overlie, M.D.   Dg Chest Port 1 View  07/11/2012   *RADIOLOGY REPORT*  Clinical Data: Follow-up CHF  PORTABLE CHEST - 1 VIEW  Comparison: 07/11/2012  Findings: Cardiac enlargement with mild vascular congestion. Negative for edema.  Mild bibasilar atelectasis unchanged.  No significant effusion.  IMPRESSION: Cardiac enlargement with mild vascular congestion.  Mild bibasilar atelectasis.  No significant change from prior study today.   Original Report Authenticated By: Janeece Riggers, M.D.   Dg Chest Port 1 View  07/11/2012   *RADIOLOGY REPORT*  Clinical Data: Hypoxia  PORTABLE CHEST - 1 VIEW  Comparison: 07/10/2012, 03/12/2012, and 12/09/2011  Findings: Stable cardiomegaly.  Pulmonary vascular congestion. Low lung volumes.  Hazy focal opacity the right lung base noted. Peripheral opacity left mid lung appears  similar to prior chest radiographs and could reflect pleuroparenchymal scarring or pleural thickening.  IMPRESSION:  1.  Focal opacity at the right lung base.  This could reflect airspace disease, atelectasis, or scarring. 2.  Cardiomegaly with pulmonary vascular congestion.   Original Report Authenticated By: Britta Mccreedy, M.D.   Dg C-arm 61-120 Min  07/10/2012   *RADIOLOGY REPORT*  Clinical Data: Open reduction and internal fixation of the left femur.  DG C-ARM 61-120 MIN,LEFT FEMUR - 2 VIEW  Comparison:  07/09/2012  Technique:  C-arm  fluoroscopic images were obtained intraoperatively and submitted for postoperative interpretation. Please see the performing provider's procedural report for the fluoroscopy time utilized.  Findings: Intraoperative images were submitted.  Again noted is a left hip arthroplasty.  A lateral plate has been placed along the femur.  There are distal interlocking screws in the comminuted distal femur fracture. Knee appears to be located.  IMPRESSION: Plate and screw fixation of the comminuted distal femur fracture.   Original Report Authenticated By: Richarda Overlie, M.D.    Scheduled Meds: . allopurinol  100 mg Oral BID  . amiodarone  200 mg Oral Daily  . aspirin EC  81 mg Oral Daily  . atorvastatin  20 mg Oral q1800  . carvedilol  25 mg Oral BID WC  . docusate sodium  100 mg Oral BID  . enoxaparin (LOVENOX) injection  30 mg Subcutaneous Q24H  . furosemide  80 mg Oral BID  . hydrALAZINE  75 mg Oral TID  . insulin aspart  0-9 Units Subcutaneous TID AC & HS  . isosorbide mononitrate  60 mg Oral Daily  . potassium chloride  10 mEq Oral Daily  . predniSONE  10 mg Oral Q breakfast  . senna  2 tablet Oral BID  . sirolimus  1.2 mg Oral Daily   Continuous Infusions: . sodium chloride 20 mL/hr at 07/11/12 0143    Principal Problem:   Fracture, femur, distal Active Problems:   Cardiomyopathy   Chronic systolic CHF (congestive heart failure)   CKD (chronic kidney disease)    Hypertension   PAF (paroxysmal atrial fibrillation)    Time spent:    CHIU, STEPHEN K  [name] Pager [phone]. If 7PM-7AM, please contact night-coverage at [email or web address], password [facility] 07/12/2012, 8:42 AM  LOS: 3 days

## 2012-07-12 NOTE — Progress Notes (Signed)
Sats dropping to low 80's when pt falls asleep.states dx sleep apnea from study yrs ago but has never used cpap. Non rebreather applied and resp therapy called,cont sat moniter still on,resp eval and changed to 50% venti mask w sats back up 98-100%,Callahan called port cxr ordered and done.pt enc ID/TCDB.has non prod cough. Linward Headland D

## 2012-07-12 NOTE — Care Management Note (Signed)
CARE MANAGEMENT NOTE 07/12/2012  Patient:  Chad [name], Chad [name]   Account Number:  [id number]  Date Initiated:  07/12/2012  Documentation initiated by:  Vance Peper  Subjective/Objective Assessment:   57 yr old male s/pleft femur IM nailing     Action/Plan:   Patient is evaluated for inpatient rehab . CM will follow.   Anticipated DC Date:     Anticipated DC Plan:  IP REHAB FACILITY      DC Planning Services  CM consult      PAC Choice  IP REHAB   Choice offered to / List presented to:             Status of service:  In process, will continue to follow Medicare Important Message given?   (If response is "NO", the following Medicare IM given date fields will be blank) Date Medicare IM given:   Date Additional Medicare IM given:    Discharge Disposition:  IP REHAB FACILITY  Per UR Regulation:    If discussed at Long Length of Stay Meetings, dates discussed:    Comments:

## 2012-07-12 NOTE — Progress Notes (Signed)
ANTIBIOTIC CONSULT NOTE - INITIAL  Pharmacy Consult for Vancomycin/Cefepime Indication: pneumonia  No Known Allergies  Patient Measurements:   Adjusted Body Weight:   Vital Signs: Temp: 100.1 F (37.8 C) (06/16 0701) Temp src: Oral (06/16 0701) BP: 125/81 mmHg (06/16 0701) Pulse Rate: 84 (06/16 0701) Intake/Output from previous day: 06/15 0701 - 06/16 0700 In: 840 [P.O.:840] Out: 825 [Urine:825] Intake/Output from this shift:    Labs:  Recent Labs  07/10/12 0540 07/10/12 1550 07/11/12 0525 07/12/12 0440  WBC 15.2* 13.7* 18.2*  --   HGB 11.2* 9.4* 8.3*  --   PLT 126* 114* 113*  --   CREATININE 4.04* 4.15* 3.83* 4.26*   The CrCl is unknown because both a height and weight (above a minimum accepted value) are required for this calculation. No results found for this basename: VANCOTROUGH, Leodis Binet, VANCORANDOM, GENTTROUGH, GENTPEAK, GENTRANDOM, TOBRATROUGH, TOBRAPEAK, TOBRARND, AMIKACINPEAK, AMIKACINTROU, AMIKACIN,  in the last 72 hours   Microbiology: Recent Results (from the past 720 hour(s))  SURGICAL PCR SCREEN     Status: None   Collection Time    07/10/12  2:42 AM      Result Value Range Status   MRSA, PCR NEGATIVE  NEGATIVE Final   Staphylococcus aureus NEGATIVE  NEGATIVE Final   Comment:            The Xpert SA Assay (FDA     approved for NASAL specimens     in patients over 61 years of age),     is one component of     a comprehensive surveillance     program.  Test performance has     been validated by [employer] for patients greater     than or equal to 77 year old.     It is not intended     to diagnose infection nor to     guide or monitor treatment.    Medical History: Past Medical History  Diagnosis Date  . Hypertension   . Hepatitis C 1996  . CKD (chronic kidney disease), stage IV     a. s/p renal transplant 1996.  Marland Kitchen Pneumonia   . HLD (hyperlipidemia)   . Nonischemic cardiomyopathy     a. unknown etiology, EF 30-35% by echo  09/2011;  b. 09/2011 Lexi MV EF 42%, no ischemia/infarct.  . Systolic CHF   . Atrial flutter     a. in the setting of sepsis 05/2011; on amio; no anticoagulation    Medications:  Prescriptions prior to admission  Medication Sig Dispense Refill  . allopurinol (ZYLOPRIM) 100 MG tablet Take 200 mg by mouth 2 (two) times daily.      Marland Kitchen amiodarone (PACERONE) 200 MG tablet Take 200 mg by mouth daily.      Marland Kitchen aspirin EC 81 MG tablet Take 81 mg by mouth daily.      . carvedilol (COREG) 25 MG tablet Take 25 mg by mouth 2 (two) times daily with a meal.      . furosemide (LASIX) 80 MG tablet Take 1 tablet (80 mg total) by mouth 2 (two) times daily.  60 tablet  2  . hydrALAZINE (APRESOLINE) 50 MG tablet Take 75 mg by mouth 3 (three) times daily. Takes 1.5 tablets      . HYDROcodone-acetaminophen (NORCO/VICODIN) 5-325 MG per tablet Take 1-2 tablets by mouth every 4 (four) hours as needed for pain.      . isosorbide mononitrate (IMDUR) 60 MG 24 hr tablet  Take 1 tablet (60 mg total) by mouth daily.  30 tablet  2  . omeprazole (PRILOSEC) 20 MG capsule Take 20 mg by mouth daily.       . potassium chloride (K-DUR,KLOR-CON) 10 MEQ tablet Take 10 mEq by mouth daily.      . pravastatin (PRAVACHOL) 40 MG tablet Take 40 mg by mouth daily.      . predniSONE (DELTASONE) 10 MG tablet Take 10 mg by mouth daily.      . sirolimus (RAPAMUNE) 1 MG/ML solution Take 1.2 mg by mouth daily.       Assessment: s/p L femur IM nailing. 57 y/o M with h/o CKD and renal transplant . Patient noted to have fever and leukocytosis. Start abx to empirically cover for HCAP/  Anticoagulation: Lovenox 30mg /d. Platelets 113   Infectious Disease: HCAP. Temp 100.1 with WBC up to 18.2. Estimated CrCl 25-30 with CrCl 4.26 Vanco 6/16>> Cefepime 6/16>>  Cardiovascular: HTN, CHF, PAF. VSS. Meds: Amiodarone, ASA 81mg , Lipitor, Coreg, po Lsix, hydralazine, Imdur, K  Endocrinology: DM, gout. Allopurinol, SSI  Nephrology: h/o CKD and renal  transplant (Rapamune and Prednisone).  Estimated CrCl 25-30 with CrCl 4.26  Hematology / Oncology: anemia with post-op Hgb 8.3.  PTA Medication Issues  Goal of Therapy:  Vancomycin trough level 15-20 mcg/ml  Plan:  Vanco 2g IV load then 1500mg  IV q48h Trough after 3-5 doses at steady stat. Cefepime 1g IV q24h  Misty Stanley [place] 07/12/2012,11:06 AM

## 2012-07-12 NOTE — Progress Notes (Signed)
Met with patient and his son at bedside to evaluate for and discuss possible CIR admission. Pt would benefit from inpatient rehab prior to d/c to home. Pt reports that he has numerous friends and family members that can provide intermittent supervision/assist. Explained to pt and son that he will need to have 24/7 supervision to min assist at home, and encouraged pt's son to look into arrangements. CIR bed is not available at this time. Will continue to follow. For questions, call [phone].

## 2012-07-12 NOTE — Progress Notes (Signed)
Have weaned pt off venti mask,back on nasal cannnula at [facility] sats maintained high 90's.good cough effort from pt,congested sounding cough still non-productive.Linward Headland D

## 2012-07-12 NOTE — Progress Notes (Signed)
Physical Therapy Treatment Patient Details Name: Chad [name] MRN: [id number] DOB: Mar 06, 1955 Today's Date: 07/12/2012 Time: 4098-1191 PT Time Calculation (min): 25 min  PT Assessment / Plan / Recommendation Comments on Treatment Session  Pain continuing to limit activity tolerance, but today was much improved from yesterday, with successful transfer OOB    Follow Up Recommendations  CIR     Does the patient have the potential to tolerate intense rehabilitation     Barriers to Discharge        Equipment Recommendations  Rolling walker with 5" wheels;Wheelchair (measurements PT);Wheelchair cushion (measurements PT)    Recommendations for Other Services Rehab consult  Frequency Min 5X/week   Plan Discharge plan remains appropriate    Precautions / Restrictions Precautions Precautions: Fall Required Braces or Orthoses: Other Brace/Splint Other Brace/Splint: Bledsoe brace  Restrictions LLE Weight Bearing: Non weight bearing   Pertinent Vitals/Pain 10+/10 LLE with motion; RN notified and gave meds; Elevated extremity    Mobility  Bed Mobility Bed Mobility: Supine to Sit;Sitting - Scoot to Edge of Bed;Sit to Supine;Scooting to [facility] Supine to Sit: 1: +2 Total assist;HOB flat;With rails Supine to Sit: Patient Percentage: 50% Sitting - Scoot to Edge of Bed: 3: Mod assist (heavy mod) Scooting to HOB: 5: Supervision;With rail Details for Bed Mobility Assistance: Pt able to use bil UEs to pull on rails and RLE to push through bed surface in order to scoot to [facility].  +2 total assist to bring LEs into/off bed and to support trunk with transitional movements in/out of bed. VCs for step by step sequencing.  Transfers Transfers: Sit to Stand;Stand to Sit;Stand Pivot Transfers Sit to Stand: 1: +2 Total assist;From elevated surface;From chair/3-in-1 Sit to Stand: Patient Percentage: 60% Stand to Sit: 1: +2 Total assist;To chair/3-in-1;With upper extremity assist;With armrests Stand to  Sit: Patient Percentage: 70% Stand Pivot Transfers: 1: +2 Total assist Stand Pivot Transfers: Patient Percentage: 60% Details for Transfer Assistance: Positioned initially for lateral scoot transfer OOB to recliner with armrest dropped; Pt then requested to try standing; Stood with physical assist, and pt very dependent on momentum, and on elevating bed; Needed extra assist for steadying RW; performed heel-toe pivot-like steps on RLE OOB to recliner    Exercises     PT Diagnosis:    PT Problem List:   PT Treatment Interventions:     PT Goals Acute Rehab PT Goals PT Goal Formulation: With patient Time For Goal Achievement: 07/25/12 Potential to Achieve Goals: Good Pt will go Supine/Side to Sit: with min assist PT Goal: Supine/Side to Sit - Progress: Progressing toward goal Pt will go Sit to Supine/Side: with min assist Pt will go Sit to Stand: with min assist PT Goal: Sit to Stand - Progress: Progressing toward goal Pt will go Stand to Sit: with min assist PT Goal: Stand to Sit - Progress: Progressing toward goal Pt will Transfer Bed to Chair/Chair to Bed: with min assist PT Transfer Goal: Bed to Chair/Chair to Bed - Progress: Progressing toward goal  Visit Information  Last PT Received On: 07/12/12 Assistance Needed: +2    Subjective Data  Subjective: Agreeable to OOB Patient Stated Goal: to get better   Cognition  Cognition Arousal/Alertness: Awake/alert Behavior During Therapy: WFL for tasks assessed/performed Overall Cognitive Status: Within Functional Limits for tasks assessed    Balance     End of Session PT - End of Session Equipment Utilized During Treatment: Gait belt Activity Tolerance: Patient limited by pain Patient left: in chair;with  call bell/phone within reach Nurse Communication: Mobility status   GP     Van Clines [facility] [place], [place] [phone]  07/12/2012, 1:22 PM

## 2012-07-12 NOTE — Progress Notes (Addendum)
Subjective: up in the chair, not much appetite, leg hurts  Objective Vital signs in last 24 hours: Filed Vitals:   07/11/12 1931 07/11/12 2300 07/11/12 2310 07/12/12 0701  BP: 96/46 102/59 102/59 125/81  Pulse: 80  83 84  Temp: 98.8 F (37.1 C)  98.7 F (37.1 C) 100.1 F (37.8 C)  TempSrc: Oral  Oral Oral  Resp: 18  16 20   SpO2: 95%  97% 97%   Weight change:   Intake/Output Summary (Last 24 hours) at 07/12/12 1419 Last data filed at 07/12/12 1358  Gross per 24 hour  Intake    240 ml  Output   1025 ml  Net   -785 ml   Physical Exam:  Blood pressure 125/81, pulse 84, temperature 100.1 F (37.8 C), temperature source Oral, resp. rate 20, SpO2 97.00%. Large framed BM NAD Lungs grossly clear S1S2 no S3 Abdomen large no focal tenderness Left leg in brace Right leg no sig edema  Labs: Basic Metabolic Panel:  Recent Labs Lab 07/09/12 2310 07/10/12 0540 07/10/12 1550 07/11/12 0525 07/12/12 0440  NA 135 137  --  135 133*  K 3.6 3.4*  --  3.3* 3.8  CL 97 99  --  99 97  CO2 26 24  --  25 23  GLUCOSE 170* 119*  --  105* 135*  BUN 92* 92*  --  79* 82*  CREATININE 4.17* 4.04* 4.15* 3.83* 4.26*  CALCIUM 8.7 8.7  --  8.0* 8.2*   Liver Function Tests:  Recent Labs Lab 07/11/12 0525  AST 21  ALT 7  ALKPHOS 48  BILITOT 0.3  PROT 5.7*  ALBUMIN 2.6*   Recent Labs Lab 07/09/12 2310 07/10/12 0540 07/10/12 1550 07/11/12 0525  WBC 12.6* 15.2* 13.7* 18.2*  NEUTROABS 10.6*  --   --  13.6*  HGB 11.4* 11.2* 9.4* 8.3*  HCT 34.8* 35.0* 29.0* 26.3*  MCV 88.1 87.9 89.0 89.8  PLT 140* 126* 114* 113*    Recent Labs Lab 07/11/12 1116 07/11/12 1708 07/11/12 2240 07/12/12 0705 07/12/12 1117  GLUCAP 104* 118* 145* 141* 136*    Iron Studies:  Recent Labs Lab 07/10/12 0540  IRON 39*  TIBC 235  FERRITIN 121   Studies/Results: Dg Chest Port 1 View  07/11/2012   *RADIOLOGY REPORT*  Clinical Data: Follow-up CHF  PORTABLE CHEST - 1 VIEW  Comparison: 07/11/2012   Findings: Cardiac enlargement with mild vascular congestion. Negative for edema.  Mild bibasilar atelectasis unchanged.  No significant effusion.  IMPRESSION: Cardiac enlargement with mild vascular congestion.  Mild bibasilar atelectasis.  No significant change from prior study today.   Original Report Authenticated By: Janeece Riggers, M.D.   Dg Chest Port 1 View  07/11/2012   *RADIOLOGY REPORT*  Clinical Data: Hypoxia  PORTABLE CHEST - 1 VIEW  Comparison: 07/10/2012, 03/12/2012, and 12/09/2011  Findings: Stable cardiomegaly.  Pulmonary vascular congestion. Low lung volumes.  Hazy focal opacity the right lung base noted. Peripheral opacity left mid lung appears similar to prior chest radiographs and could reflect pleuroparenchymal scarring or pleural thickening.  IMPRESSION:  1.  Focal opacity at the right lung base.  This could reflect airspace disease, atelectasis, or scarring. 2.  Cardiomegaly with pulmonary vascular congestion.   Original Report Authenticated By: Britta Mccreedy, M.D.   Medications: . sodium chloride 20 mL/hr at 07/11/12 0143   . allopurinol  100 mg Oral BID  . amiodarone  200 mg Oral Daily  . aspirin EC  81 mg Oral Daily  .  atorvastatin  20 mg Oral q1800  . carvedilol  25 mg Oral BID WC  . [START ON 07/13/2012] ceFEPime (MAXIPIME) IV  1 g Intravenous Q24H  . docusate sodium  100 mg Oral BID  . enoxaparin (LOVENOX) injection  30 mg Subcutaneous Q24H  . furosemide  80 mg Oral BID  . hydrALAZINE  75 mg Oral TID  . insulin aspart  0-9 Units Subcutaneous TID AC & HS  . isosorbide mononitrate  60 mg Oral Daily  . potassium chloride  10 mEq Oral Daily  . predniSONE  10 mg Oral Q breakfast  . senna  2 tablet Oral BID  . sirolimus  1.2 mg Oral Daily  . [START ON 07/14/2012] vancomycin  1,500 mg Intravenous Q48H  . vancomycin  2,000 mg Intravenous Once    I  have reviewed scheduled and prn medications.  ASSESSMENT/RECOMMENDATIONS:  57 yo AA male with history of hypertension,  hepatitis C, chronic kidney disease with renal transplant 1996 with baseline creatinine around 3.5 (3.42 on 06/30/12)  and left hip replacement, admitted 07/10/2012 after fall landing on his left knee. X-rays and imaging revealed left intra-articular distal femur fracture. Underwent ORIF of left distal femur fracture 07/10/2012 by Dr. [name].  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 Caryn Section recommended access placement if here for any length of time but concerned that this would interfere with his rehab as he will be highly dependent on his upper extremities with NWB status of left leg so will hold on that for now  S/p ORIF left distal femur fracture For rehab  HTN Blood pressures soft Reduce hydralazine 75 tid -->25 TID and hold for systolic under 130  ABLA with Hb down to 8.3 Iron low Dose Feraheme and give dose of darbepoetin Transfuse if <7  Cardiomyopathy EF 30-35  AFib  On amio  HepC +   Camille Bal, MD [facility] [phone] pager 07/12/2012, 2:19 PM

## 2012-07-12 NOTE — Progress Notes (Signed)
Pt was noted to be hypoxic yesterday afternoon w/ sats in the 80's. Pt did not complain of SOB but did appear to have some labored breathing. CXR obtained, which revealed increased vascular markings suggestive of pulm edema by my read. 40mg  of IV lasix was given with improved o2 sats within 30-45min. Home diuretic meds reordered for tomorrow. Will follow renal fx closely. Again, Nephrology is on board.

## 2012-07-12 NOTE — Progress Notes (Signed)
Rehab Admissions Coordinator Note:  Patient was screened by [name] for appropriateness for an Inpatient Acute Rehab Consult.  An Inpatient Rehab consult has already been ordered.  [name] 07/12/2012, 8:21 AM  I can be reached at [phone].

## 2012-07-12 NOTE — Progress Notes (Signed)
Pt was never given cpap.  He was placed on NRB then [facility] for oxygenation purposes.

## 2012-07-12 NOTE — Progress Notes (Signed)
UR COMPLETED  

## 2012-07-13 ENCOUNTER — Encounter (HOSPITAL_COMMUNITY): Payer: Self-pay | Admitting: Orthopedic Surgery

## 2012-07-13 DIAGNOSIS — J189 Pneumonia, unspecified organism: Secondary | ICD-10-CM

## 2012-07-13 LAB — GLUCOSE, CAPILLARY: Glucose-Capillary: 167 mg/dL — ABNORMAL HIGH (ref 70–99)

## 2012-07-13 LAB — CBC
MCHC: 33 g/dL (ref 30.0–36.0)
MCV: 87.6 fL (ref 78.0–100.0)
Platelets: 96 10*3/uL — ABNORMAL LOW (ref 150–400)
RDW: 17.5 % — ABNORMAL HIGH (ref 11.5–15.5)
WBC: 15.4 10*3/uL — ABNORMAL HIGH (ref 4.0–10.5)

## 2012-07-13 LAB — RENAL FUNCTION PANEL
Albumin: 2.2 g/dL — ABNORMAL LOW (ref 3.5–5.2)
BUN: 84 mg/dL — ABNORMAL HIGH (ref 6–23)
GFR calc Af Amer: 18 mL/min — ABNORMAL LOW (ref >90)
Phosphorus: 6.4 mg/dL — ABNORMAL HIGH (ref 2.3–4.6)
Potassium: 4.1 mEq/L (ref 3.5–5.1)
Sodium: 134 mEq/L — ABNORMAL LOW (ref 135–145)

## 2012-07-13 LAB — LEGIONELLA ANTIGEN, URINE: Legionella Antigen, Urine: NEGATIVE

## 2012-07-13 LAB — PREPARE RBC (CROSSMATCH)

## 2012-07-13 MED ORDER — FUROSEMIDE 10 MG/ML IJ SOLN
80.0000 mg | Freq: Once | INTRAMUSCULAR | Status: AC
  Administered 2012-07-13: 80 mg via INTRAVENOUS
  Filled 2012-07-13: qty 8

## 2012-07-13 MED ORDER — DIPHENHYDRAMINE HCL 25 MG PO CAPS
25.0000 mg | ORAL_CAPSULE | Freq: Once | ORAL | Status: AC
  Administered 2012-07-13: 25 mg via ORAL
  Filled 2012-07-13: qty 1

## 2012-07-13 MED ORDER — ACETAMINOPHEN 325 MG PO TABS
650.0000 mg | ORAL_TABLET | Freq: Once | ORAL | Status: AC
  Administered 2012-07-13: 650 mg via ORAL

## 2012-07-13 NOTE — Progress Notes (Addendum)
CIR bed is not available today. Recommend pursuing SNF as backup plan. RNCM,  CSW and patient have been informed of above.  Will continue to follow. Call [phone] for questions.

## 2012-07-13 NOTE — Progress Notes (Signed)
Subjective: 3 Days Post-Op Procedure(s) (LRB): IM FEMORAL NAIL (Left) Patient reports pain as mild.   OOB to attempt transfer with PT.  Slept well overnight.  Objective: Vital signs in last 24 hours: Temp:  [98 F (36.7 C)-99.3 F (37.4 C)] 98.6 F (37 C) (06/17 1630) Pulse Rate:  [75-79] 75 (06/17 1630) Resp:  [16-18] 16 (06/17 1630) BP: (121-133)/(67-78) 125/78 mmHg (06/17 1630) SpO2:  [98 %] 98 % (06/17 0629) Weight:  [122.471 kg (270 lb)] 122.471 kg (270 lb) (06/17 1422)  Intake/Output from previous day: 06/16 0701 - 06/17 0700 In: 720 [P.O.:720] Out: 1900 [Urine:1900] Intake/Output this shift: Total I/O In: 560 [P.O.:560] Out: 900 [Urine:900]   Recent Labs  07/11/12 0525 07/13/12 0450  HGB 8.3* 7.0*    Recent Labs  07/11/12 0525 07/13/12 0450  WBC 18.2* 15.4*  RBC 2.93* 2.42*  HCT 26.3* 21.2*  PLT 113* 96*    Recent Labs  07/12/12 0440 07/13/12 0450  NA 133* 134*  K 3.8 4.1  CL 97 99  CO2 23 23  BUN 82* 84*  CREATININE 4.26* 4.06*  GLUCOSE 135* 101*  CALCIUM 8.2* 8.4      Assessment/Plan: 3 Days Post-Op Procedure(s) (LRB): IM FEMORAL NAIL (Left) Up with therapy  TTWB on L LE.  SNF pending.  Toni Arthurs 07/13/2012, 4:58 PM

## 2012-07-13 NOTE — Progress Notes (Signed)
Physical Therapy Treatment Patient Details Name: BARRON VANLOAN MRN: [id number] DOB: [birth year] Today's Date: 07/13/2012 Time: 0922-0955 PT Time Calculation (min): 33 min  PT Assessment / Plan / Recommendation Comments on Treatment Session  Very hard working, despite pain; Able to transition sit to stand from a lower surface today with assist; Noted our CIR beds are full, and suggestion has been made to look into SNF for rehab, which is a viable and reasonable option; I'm concerned for Mr. Walrond' safety if he must dc home alone, and he currently requires extensive assist; Feel it is worth also looking in to off-site [facility], like [facility] and [facility], as Mr. Veals is a great rehab candidate    Follow Up Recommendations  CIR (Worth looking into [facility] or [facility] CIR)     Does the patient have the potential to tolerate intense rehabilitation     Barriers to Discharge        Equipment Recommendations  Rolling walker with 5" wheels;Wheelchair (measurements PT);Wheelchair cushion (measurements PT)    Recommendations for Other Services    Frequency Min 5X/week   Plan Discharge plan remains appropriate    Precautions / Restrictions Precautions Precautions: Fall Required Braces or Orthoses: Other Brace/Splint Other Brace/Splint: Bledsoe brace  Restrictions Weight Bearing Restrictions: Yes LLE Weight Bearing: Non weight bearing   Pertinent Vitals/Pain 10/10 with transfers; settling back down to 6/10 after a few minutes of seated rest; RN provided medication to assist with pain control     Mobility  Transfers Transfers: Sit to Stand;Stand to Sit Sit to Stand: 1: +2 Total assist;With upper extremity assist;From chair/3-in-1 Sit to Stand: Patient Percentage: 50% Stand to Sit: 1: +2 Total assist;With upper extremity assist;To chair/3-in-1 Stand to Sit: Patient Percentage: 50% Stand Pivot Transfers: 1: +2 Total assist Stand Pivot Transfers: Patient  Percentage: 40% Details for Transfer Assistance: verbal cues for hand placement and assist to rise and stabilize as well as control descent.; Attempted Basic stand pivot transfer with "heel-to" pivot on RLE, however, pt unable to keep weight off of LLE, and sat back down in Research scientist (life sciences) Mobility:  (Discussed working on wheelchair mobility next session)    Exercises     PT Diagnosis:    PT Problem List:   PT Treatment Interventions:     PT Goals Acute Rehab PT Goals Time For Goal Achievement: 07/25/12 Potential to Achieve Goals: Good Pt will go Sit to Stand: with min assist PT Goal: Sit to Stand - Progress: Progressing toward goal Pt will go Stand to Sit: with min assist PT Goal: Stand to Sit - Progress: Progressing toward goal Pt will Transfer Bed to Chair/Chair to Bed: with min assist PT Transfer Goal: Bed to Chair/Chair to Bed - Progress: Progressing toward goal Pt will Propel Wheelchair: > 150 feet;with modified independence  Visit Information  Last PT Received On: 07/13/12 Assistance Needed: +2 PT/OT Co-Evaluation/Treatment: Yes    Subjective Data  Subjective: Wanting to work despite pain Patient Stated Goal: to get better   Cognition  Cognition Arousal/Alertness: Awake/alert Behavior During Therapy: WFL for tasks assessed/performed Overall Cognitive Status: Within Functional Limits for tasks assessed    Balance     End of Session PT - End of Session Equipment Utilized During Treatment: Gait belt Activity Tolerance: Patient limited by pain Patient left: in chair;with call bell/phone within reach (Dr. Eliott Nine and Vance Peper, Case Mgr in) Nurse Communication: Mobility status   GP     Van Clines [facility]  [place], [place] [phone]  07/13/2012, 10:56 AM

## 2012-07-13 NOTE — Progress Notes (Signed)
Occupational Therapy Treatment Patient Details Name: Chad [name] MRN: [id number] DOB: February 09, 1955 Today's Date: 07/13/2012 Time: 7829-5621 OT Time Calculation (min): 33 min  OT Assessment / Plan / Recommendation Comments on Treatment Session Pt limited by pain this visit 10/10 but down to 6 at end of session. Unable to complete transfer to [facility] this visit but did work on sit to stand and stand to sit. Attempted to pivot to [facility] but unable to maintain NWB L LE during "heel toe slide" of R LE.    Follow Up Recommendations  Other (comment);Supervision/Assistance - 24 hour CIR versus (per note, may need to pursue SNF)    Barriers to Discharge       Equipment Recommendations  3 in 1 bedside comode (wide)    Recommendations for Other Services    Frequency Min 2X/week   Plan Discharge plan needs to be updated    Precautions / Restrictions Precautions Precautions: Fall Required Braces or Orthoses: Other Brace/Splint Other Brace/Splint: Bledsoe brace  Restrictions Weight Bearing Restrictions: Yes LLE Weight Bearing: Non weight bearing        ADL  ADL Comments: Attempted to toilet transfer to [facility] but pt unable to maintain NWB on L LE so sat back down after a few "heel-toe slides" with R LE toward BSC on left. Pt uses momentum to help with standing from chair. Pt states the chair is more comfortable than the bed and he slept in the chair for the night.     OT Diagnosis:    OT Problem List:   OT Treatment Interventions:     OT Goals    Visit Information  Last OT Received On: 07/13/12 Assistance Needed: +2 PT/OT Co-Evaluation/Treatment: Yes    Subjective Data  Subjective: I sat in the recliner to sleep Patient Stated Goal: agreeable to therapy; none stated   Prior Functioning       Cognition  Cognition Arousal/Alertness: Awake/alert Behavior During Therapy: WFL for tasks assessed/performed Overall Cognitive Status: Within Functional Limits for tasks assessed     Mobility  Transfers Transfers: Sit to Stand;Stand to Sit Sit to Stand: 1: +2 Total assist;With upper extremity assist;From chair/3-in-1 Sit to Stand: Patient Percentage: 50% Stand to Sit: 1: +2 Total assist;With upper extremity assist;To chair/3-in-1 Stand to Sit: Patient Percentage: 50% Details for Transfer Assistance: verbal cues for hand placement and assist to rise and stabilize as well as control descent.    Exercises      Balance     End of Session OT - End of Session Equipment Utilized During Treatment: Gait belt Activity Tolerance: Patient limited by pain Patient left: in chair;with call bell/phone within reach  GO     Lennox Laity [phone] 07/13/2012, 10:16 AM

## 2012-07-13 NOTE — Clinical Social Work Placement (Addendum)
Clinical Social Work Department  CLINICAL SOCIAL WORK PLACEMENT NOTE  07/13/2012  Patient: Chad [name] Account Number: [id number]  Admit date: 07/09/12 Clinical Social Worker: Sabino Niemann MSW Date/time: 07/13/2012 10:30 AM  Clinical Social Work is seeking post-discharge placement for this patient at the following level of care: SKILLED NURSING (*CSW will update this form in Epic as items are completed)  07/13/2012 Patient/family provided with [facility] Department of Clinical Social Work's list of facilities offering this level of care within the geographic area requested by the patient (or if unable, by the patient's family).  07/13/2012 Patient/family informed of their freedom to choose among providers that offer the needed level of care, that participate in Medicare, Medicaid or managed care program needed by the patient, have an available bed and are willing to accept the patient.  07/13/2012 Patient/family informed of [facility]' ownership interest in [facility], as well as of the fact that they are under no obligation to receive care at this facility.  PASARR submitted to EDS on   PASARR number received from EDS on  FL2 transmitted to all facilities in geographic area requested by pt/family on 07/13/2012  FL2 transmitted to all facilities within larger geographic area on  Patient informed that his/her managed care company has contracts with or will negotiate with certain facilities, including the following:  Patient/family informed of bed offers received:  Patient chooses bed at  Physician recommends and patient chooses bed at  Patient to be transferred to on  Patient to be transferred to facility by  The following physician request were entered in Epic:  Additional Comments:

## 2012-07-13 NOTE — Progress Notes (Addendum)
[name] PROGRESS NOTE  Chad [name] ZOX:[id number] DOB: January 31, 1955 DOA: 07/09/2012 PCP: [name], MD  Assessment/Plan: 1. Fx of Left Distal Femur  -Ortho: Dr. [name] following  -S/p ORIF of L distal femur fx  -Pain Control and knee Immobilizer per Ortho for now. -CIR vs ?SNF  2. CKD and Renal Tx  -Monitor BUN.Cr  -Chart reviewed. Pt noted to have hx of stage 4 ckd with baseline Cr of around 3.5 as of 2/14.  -Continue Rappimmune Rx w/ prednisone  -Nephro following Followed by Dr. Caryn Section  -Cr improved today to 4.0 -Cr 4.2 today  3. HTN  - Cont current regimen - Well controlled  4. Cardiomyopathy/ and Systolic CHF  - Furosemide IV when needed and monitor for signs of fluid overload, currently compensated.  5. PAF  -Rate controlled.  -On home PO meds  6. Hyperglycemia  - SSI coverage PRN  -Controlled 7. Anemia  - due to chronic disease and Renal Disease, send anemia panel.  8. SCDs for DVT prophylaxis.  9. Possible HCAP - On empiric abx for questionable HCAP in the setting of immunocompromised state.  -Stable Leucocytosis which may be secondary to steroids -Afebrile -Would continue on empiric abx for 7 days  Code Status: Full Family Communication: Pt in room (indicate person spoken with, relationship, and if by phone, the number) Disposition Plan: Pending  Consultants:  Orthopedics  Nephrology  Procedures:  ORIF of L distal femur fracture on 07/10/12  Antibiotics: Cefazolin x 1 dose perioperatively on 07/10/12  Vanocmycin 07/12/12 >>>  Cefepime 07/12/12>>>  HPI/Subjective: Pt reports feeling well. Has a mild cough  Objective: Filed Vitals:   07/12/12 0701 07/12/12 1400 07/12/12 2157 07/13/12 0629  BP: 125/81 124/68 121/72 128/70  Pulse: 84 80 77 78  Temp: 100.1 F (37.8 C) 99.1 F (37.3 C) 98 F (36.7 C) 99.3 F (37.4 C)  TempSrc: Oral     Resp: 20 20 18 18   SpO2: 97% 95% 98% 98%    Intake/Output Summary (Last 24 hours) at 07/13/12  0752 Last data filed at 07/13/12 8119  Gross per 24 hour  Intake    720 ml  Output   1900 ml  Net  -1180 ml   There were no vitals filed for this visit.  Exam:   General:  Awake, in nad  Cardiovascular: regular, s1, s2  Respiratory: normal resp effort, no wheezing  Abdomen: soft, nondistended  Musculoskeletal: perfused, no clubbing   Data Reviewed: Basic Metabolic Panel:  Recent Labs Lab 07/09/12 2310 07/10/12 0540 07/10/12 1550 07/11/12 0525 07/12/12 0440 07/13/12 0450  NA 135 137  --  135 133* 134*  K 3.6 3.4*  --  3.3* 3.8 4.1  CL 97 99  --  99 97 99  CO2 26 24  --  25 23 23   GLUCOSE 170* 119*  --  105* 135* 101*  BUN 92* 92*  --  79* 82* 84*  CREATININE 4.17* 4.04* 4.15* 3.83* 4.26* 4.06*  CALCIUM 8.7 8.7  --  8.0* 8.2* 8.4  PHOS  --   --   --   --   --  6.4*   Liver Function Tests:  Recent Labs Lab 07/11/12 0525 07/13/12 0450  AST 21  --   ALT 7  --   ALKPHOS 48  --   BILITOT 0.3  --   PROT 5.7*  --   ALBUMIN 2.6* 2.2*   No results found for this basename: LIPASE, AMYLASE,  in the last  168 hours No results found for this basename: AMMONIA,  in the last 168 hours CBC:  Recent Labs Lab 07/09/12 2310 07/10/12 0540 07/10/12 1550 07/11/12 0525 07/13/12 0450  WBC 12.6* 15.2* 13.7* 18.2* 15.4*  NEUTROABS 10.6*  --   --  13.6*  --   HGB 11.4* 11.2* 9.4* 8.3* 7.0*  HCT 34.8* 35.0* 29.0* 26.3* 21.2*  MCV 88.1 87.9 89.0 89.8 87.6  PLT 140* 126* 114* 113* 96*   Cardiac Enzymes: No results found for this basename: CKTOTAL, CKMB, CKMBINDEX, TROPONINI,  in the last 168 hours BNP (last 3 results)  Recent Labs  12/30/11 1057 03/12/12 0449 03/15/12 0440  PROBNP 534.0* 14215.0* 30340.0*   CBG:  Recent Labs Lab 07/12/12 0705 07/12/12 1117 07/12/12 1556 07/12/12 2201 07/13/12 0630  GLUCAP 141* 136* 171* 128* 87    Recent Results (from the past 240 hour(s))  SURGICAL PCR SCREEN     Status: None   Collection Time    07/10/12  2:42 AM       Result Value Range Status   MRSA, PCR NEGATIVE  NEGATIVE Final   Staphylococcus aureus NEGATIVE  NEGATIVE Final   Comment:            The Xpert SA Assay (FDA     approved for NASAL specimens     in patients over 72 years of age),     is one component of     a comprehensive surveillance     program.  Test performance has     been validated by [employer] for patients greater     than or equal to 22 year old.     It is not intended     to diagnose infection nor to     guide or monitor treatment.     Studies: Dg Chest Port 1 View  07/11/2012   *RADIOLOGY REPORT*  Clinical Data: Follow-up CHF  PORTABLE CHEST - 1 VIEW  Comparison: 07/11/2012  Findings: Cardiac enlargement with mild vascular congestion. Negative for edema.  Mild bibasilar atelectasis unchanged.  No significant effusion.  IMPRESSION: Cardiac enlargement with mild vascular congestion.  Mild bibasilar atelectasis.  No significant change from prior study today.   Original Report Authenticated By: Janeece Riggers, M.D.   Dg Chest Port 1 View  07/11/2012   *RADIOLOGY REPORT*  Clinical Data: Hypoxia  PORTABLE CHEST - 1 VIEW  Comparison: 07/10/2012, 03/12/2012, and 12/09/2011  Findings: Stable cardiomegaly.  Pulmonary vascular congestion. Low lung volumes.  Hazy focal opacity the right lung base noted. Peripheral opacity left mid lung appears similar to prior chest radiographs and could reflect pleuroparenchymal scarring or pleural thickening.  IMPRESSION:  1.  Focal opacity at the right lung base.  This could reflect airspace disease, atelectasis, or scarring. 2.  Cardiomegaly with pulmonary vascular congestion.   Original Report Authenticated By: Britta Mccreedy, M.D.    Scheduled Meds: . allopurinol  100 mg Oral BID  . amiodarone  200 mg Oral Daily  . aspirin EC  81 mg Oral Daily  . atorvastatin  20 mg Oral q1800  . carvedilol  25 mg Oral BID WC  . ceFEPime (MAXIPIME) IV  1 g Intravenous Q24H  . darbepoetin (ARANESP)  injection - NON-DIALYSIS  100 mcg Subcutaneous Q Mon-1800  . docusate sodium  100 mg Oral BID  . enoxaparin (LOVENOX) injection  30 mg Subcutaneous Q24H  . furosemide  80 mg Oral BID  . hydrALAZINE  25 mg Oral  TID  . insulin aspart  0-9 Units Subcutaneous TID AC & HS  . isosorbide mononitrate  60 mg Oral Daily  . potassium chloride  10 mEq Oral Daily  . predniSONE  10 mg Oral Q breakfast  . senna  2 tablet Oral BID  . sirolimus  1.2 mg Oral Daily  . [START ON 07/14/2012] vancomycin  1,500 mg Intravenous Q48H   Continuous Infusions: . sodium chloride 20 mL/hr at 07/11/12 0143    Principal Problem:   Fracture, femur, distal Active Problems:   Cardiomyopathy   Chronic systolic CHF (congestive heart failure)   CKD (chronic kidney disease)   Hypertension   PAF (paroxysmal atrial fibrillation)    Time spent: 30    Carliss Porcaro K  [name] Pager [phone]. If 7PM-7AM, please contact night-coverage at [email or web address], password [facility] 07/13/2012, 7:52 AM  LOS: 4 days

## 2012-07-13 NOTE — Progress Notes (Signed)
Pt refused CPAP, Pt stated that "he's been doing fine without it".  RT to monitor and assess as needed.

## 2012-07-13 NOTE — Progress Notes (Signed)
Subjective:  PT in with pt Also case manager exploring options which, unless Medicare can be clarified, are home (which seems not option as patient lives alone) Up in the chair More alert than yesterday Low grade fevers  Objective Vital signs in last 24 hours: Filed Vitals:   07/12/12 0701 07/12/12 1400 07/12/12 2157 07/13/12 0629  BP: 125/81 124/68 121/72 128/70  Pulse: 84 80 77 78  Temp: 100.1 F (37.8 C) 99.1 F (37.3 C) 98 F (36.7 C) 99.3 F (37.4 C)  TempSrc: Oral     Resp: 20 20 18 18   SpO2: 97% 95% 98% 98%   Weight change:   Intake/Output Summary (Last 24 hours) at 07/13/12 1004 Last data filed at 07/13/12 0629  Gross per 24 hour  Intake    720 ml  Output   1900 ml  Net  -1180 ml   Physical Exam: BP 128/70  Pulse 78  Temp(Src) 99.3 F (37.4 C) (Oral)  Resp 18  SpO2 98%Large framed BM NAD Lungs Grossly clear anteriorly S1S2 no S3 Irreg Abdomen large no focal tenderness Right renal allograft non tender Left leg in brace Right leg no sig edema/wering TED stocking  Labs: Basic Metabolic Panel:  Recent Labs Lab 07/09/12 2310 07/10/12 0540 07/10/12 1550 07/11/12 0525 07/12/12 0440 07/13/12 0450  NA 135 137  --  135 133* 134*  K 3.6 3.4*  --  3.3* 3.8 4.1  CL 97 99  --  99 97 99  CO2 26 24  --  25 23 23   GLUCOSE 170* 119*  --  105* 135* 101*  BUN 92* 92*  --  79* 82* 84*  CREATININE 4.17* 4.04* 4.15* 3.83* 4.26* 4.06*  CALCIUM 8.7 8.7  --  8.0* 8.2* 8.4  PHOS  --   --   --   --   --  6.4*   Liver Function Tests:  Recent Labs Lab 07/11/12 0525 07/13/12 0450  AST 21  --   ALT 7  --   ALKPHOS 48  --   BILITOT 0.3  --   PROT 5.7*  --   ALBUMIN 2.6* 2.2*    Recent Labs Lab 07/09/12 2310 07/10/12 0540 07/10/12 1550 07/11/12 0525 07/13/12 0450  WBC 12.6* 15.2* 13.7* 18.2* 15.4*  NEUTROABS 10.6*  --   --  13.6*  --   HGB 11.4* 11.2* 9.4* 8.3* 7.0*  HCT 34.8* 35.0* 29.0* 26.3* 21.2*  MCV 88.1 87.9 89.0 89.8 87.6  PLT 140* 126* 114* 113*  96*    Recent Labs Lab 07/12/12 0705 07/12/12 1117 07/12/12 1556 07/12/12 2201 07/13/12 0630  GLUCAP 141* 136* 171* 128* 87    Iron Studies:   Recent Labs Lab 07/10/12 0540  IRON 39*  TIBC 235  FERRITIN 121   Studies/Results: Dg Chest Port 1 View  07/11/2012   *RADIOLOGY REPORT*  Clinical Data: Follow-up CHF  PORTABLE CHEST - 1 VIEW  Comparison: 07/11/2012  Findings: Cardiac enlargement with mild vascular congestion. Negative for edema.  Mild bibasilar atelectasis unchanged.  No significant effusion.  IMPRESSION: Cardiac enlargement with mild vascular congestion.  Mild bibasilar atelectasis.  No significant change from prior study today.   Original Report Authenticated By: Janeece Riggers, M.D.   Dg Chest Port 1 View  07/11/2012   *RADIOLOGY REPORT*  Clinical Data: Hypoxia  PORTABLE CHEST - 1 VIEW  Comparison: 07/10/2012, 03/12/2012, and 12/09/2011  Findings: Stable cardiomegaly.  Pulmonary vascular congestion. Low lung volumes.  Hazy focal opacity the right lung  base noted. Peripheral opacity left mid lung appears similar to prior chest radiographs and could reflect pleuroparenchymal scarring or pleural thickening.  IMPRESSION:  1.  Focal opacity at the right lung base.  This could reflect airspace disease, atelectasis, or scarring. 2.  Cardiomegaly with pulmonary vascular congestion.   Original Report Authenticated By: Britta Mccreedy, M.D.   Medications: . sodium chloride 20 mL/hr at 07/11/12 0143   . allopurinol  100 mg Oral BID  . amiodarone  200 mg Oral Daily  . aspirin EC  81 mg Oral Daily  . atorvastatin  20 mg Oral q1800  . carvedilol  25 mg Oral BID WC  . ceFEPime (MAXIPIME) IV  1 g Intravenous Q24H  . darbepoetin (ARANESP) injection - NON-DIALYSIS  100 mcg Subcutaneous Q Mon-1800  . docusate sodium  100 mg Oral BID  . enoxaparin (LOVENOX) injection  30 mg Subcutaneous Q24H  . furosemide  80 mg Oral BID  . hydrALAZINE  25 mg Oral TID  . insulin aspart  0-9 Units  Subcutaneous TID AC & HS  . isosorbide mononitrate  60 mg Oral Daily  . potassium chloride  10 mEq Oral Daily  . predniSONE  10 mg Oral Q breakfast  . senna  2 tablet Oral BID  . sirolimus  1.2 mg Oral Daily  . [START ON 07/14/2012] vancomycin  1,500 mg Intravenous Q48H    I  have reviewed scheduled and prn medications.  ASSESSMENT/RECOMMENDATIONS:  57 yo AA male with history of hypertension, hepatitis C, chronic kidney disease with renal transplant 1996 with baseline creatinine around 3.5 (3.42 on 06/30/12)  and left hip replacement, admitted 07/10/2012 after fall landing on his left knee. X-rays and imaging revealed left intra-articular distal femur fracture. Underwent ORIF of left distal femur fracture 07/10/2012 by Dr. [name].  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 Caryn Section recommended access placement if here for any length of time but concerned that this would interfere with his rehab as he will be highly dependent on his upper extremities with NWB status of left leg so will hold on that for now  S/p ORIF left distal femur fracture No rehab beds Options may be limited to home if no medicare (but should have since is transplant) Case manager investigating  Low grade fevers, leukocytosis On maxipine and vanco WBC falling a little  HTN Blood pressures were soft Reduced hydralazine 75 tid -->25 TID with hold for systolic under 130 (6/16).     ABLA with Hb down to 8.3 post op, further reduced to 7 today Iron low Dosed Feraheme X 1 6/15 Started Darbe 100/week (6/16) Transfuse 1 unit today (6/17) for Hb of 7 with lasix 80 pre  Cardiomyopathy EF 30-35  AFib  On amio  HepC +   Camille Bal, MD [facility] [phone] pager 07/13/2012, 10:04 AM

## 2012-07-13 NOTE — Clinical Social Work Psychosocial (Signed)
Clinical Social Work Department  BRIEF PSYCHOSOCIAL ASSESSMENT  Patient: Chad [name] Account Number: [id number]  Admit date: 07/09/12 Clinical Social Worker Navil Kole Riley Kill, MSW Date/Time:  Referred by: Physician Date Referred: 07/13/2012 Referred for   SNF Placement   Other Referral:  Interview type: Patient  Other interview type: PSYCHOSOCIAL DATA  Living Status: Patient lives with his significant other Admitted from facility:  Level of care:  Primary support name: Linden Dolin Primary support relationship to patient: Significant other Degree of support available:  Strong and vested  CURRENT CONCERNS  Current Concerns   Post-Acute Placement   Other Concerns:  SOCIAL WORK ASSESSMENT / PLAN  CSW met with pt re: PT recommendation for CIR\SNF. CIR may not have a bed. Patient is agreeable to SNF placement as a backup  Pt lives with his significant other  CSW explained placement process and answered questions.   Pt reports no preference at this time   CSW completed FL2 and initiated SNF search.     Assessment/plan status: Information/Referral to [facility]  Other assessment/ plan:  Information/referral to community resources:  SNF     PATIENT'S/FAMILY'S RESPONSE TO PLAN OF CARE:  Pt  reports he is agreeable to [facility] SNF as a backup to CIR, in order to increase strength and independence with mobility prior to returning home  Pt verbalized understanding of placement process and appreciation for CSW assist.   Sabino Niemann, MSW [phone]

## 2012-07-14 DIAGNOSIS — J9600 Acute respiratory failure, unspecified whether with hypoxia or hypercapnia: Secondary | ICD-10-CM

## 2012-07-14 DIAGNOSIS — N179 Acute kidney failure, unspecified: Secondary | ICD-10-CM

## 2012-07-14 DIAGNOSIS — I5023 Acute on chronic systolic (congestive) heart failure: Secondary | ICD-10-CM

## 2012-07-14 LAB — RENAL FUNCTION PANEL
BUN: 88 mg/dL — ABNORMAL HIGH (ref 6–23)
CO2: 25 mEq/L (ref 19–32)
Calcium: 8.6 mg/dL (ref 8.4–10.5)
Creatinine, Ser: 3.84 mg/dL — ABNORMAL HIGH (ref 0.50–1.35)
GFR calc non Af Amer: 16 mL/min — ABNORMAL LOW (ref >90)
Glucose, Bld: 123 mg/dL — ABNORMAL HIGH (ref 70–99)

## 2012-07-14 LAB — TYPE AND SCREEN: Unit division: 0

## 2012-07-14 LAB — CBC
HCT: 23.3 % — ABNORMAL LOW (ref 39.0–52.0)
MCH: 28.8 pg (ref 26.0–34.0)
MCV: 87.3 fL (ref 78.0–100.0)
Platelets: 113 10*3/uL — ABNORMAL LOW (ref 150–400)
RBC: 2.67 MIL/uL — ABNORMAL LOW (ref 4.22–5.81)

## 2012-07-14 LAB — GLUCOSE, CAPILLARY
Glucose-Capillary: 121 mg/dL — ABNORMAL HIGH (ref 70–99)
Glucose-Capillary: 121 mg/dL — ABNORMAL HIGH (ref 70–99)
Glucose-Capillary: 124 mg/dL — ABNORMAL HIGH (ref 70–99)
Glucose-Capillary: 138 mg/dL — ABNORMAL HIGH (ref 70–99)
Glucose-Capillary: 145 mg/dL — ABNORMAL HIGH (ref 70–99)
Glucose-Capillary: 147 mg/dL — ABNORMAL HIGH (ref 70–99)

## 2012-07-14 MED ORDER — ENOXAPARIN SODIUM 30 MG/0.3ML ~~LOC~~ SOLN: 30.0000 mg | SUBCUTANEOUS | Status: DC

## 2012-07-14 MED ORDER — HYDROCODONE-ACETAMINOPHEN 5-325 MG PO TABS: 1.0000 | ORAL_TABLET | ORAL | Status: DC | PRN

## 2012-07-14 NOTE — Progress Notes (Signed)
IV access regained. 10mg  Iv Hydralazine administered. Will continue to monitor.

## 2012-07-14 NOTE — Progress Notes (Signed)
Occupational Therapy Treatment Patient Details Name: Chad [name] MRN: [id number] DOB: 01/20/1956 Today's Date: 07/14/2012 Time: 7829-5621 OT Time Calculation (min): 24 min  OT Assessment / Plan / Recommendation Comments on Treatment Session   Pt highly motivated to return to PLOF. In today's session was very concerned by the D/C plan to go home. At this time pt requires 2+ to 4 people (A) for mobility and transfers and is unsafe to D/C home. Pt would be a great candidate for CIR to reduce caregiver burden and increase independence. Next session plan to practice transfer to drop arm commode, and use of AE for ADLs. If pt D/C home pt will need hospital bed, drop arm commode, wheelchair, sliding board and lift chair. Along with 24/7 care and (A) and HHOT.      Follow Up Recommendations  CIR;Supervision/Assistance - 24 hour    Barriers to Discharge       Equipment Recommendations  Other (comment) (drop arm commode)    Recommendations for Other Services    Frequency Min 2X/week   Plan Discharge plan remains appropriate    Precautions / Restrictions Precautions Precautions: Fall Required Braces or Orthoses: Other Brace/Splint Other Brace/Splint: Bledsoe brace  Restrictions Weight Bearing Restrictions: Yes LLE Weight Bearing: Non weight bearing   Pertinent Vitals/Pain 5-6/10 after session; pt premedicated prior to session.      ADL  Grooming: Wash/dry face;Set up Where Assessed - Grooming: Supported sitting Toilet Transfer: +2 Total assistance Toilet Transfer: Patient Percentage: 20% Toilet Transfer Method: Sit to stand;Stand pivot Acupuncturist: Other (comment) (from recliner chair to bed) Equipment Used: Gait belt;Other (comment);Rolling walker (bledsoe brace, O2) Transfers/Ambulation Related to ADLs: Pt requiring 4 people to assist with stand pivot transfer from chair to bed. ADL Comments: Pt performed stand pivot transfer from recliner chair to bed. OT  discussed dressing technique and use of a reacher. Pt needs to practice with AE before going home. OT also educated on using sliding board to transfer to drop arm commode. Will also need to practice.    OT Diagnosis:    OT Problem List:   OT Treatment Interventions:     OT Goals Acute Rehab OT Goals OT Goal Formulation: With patient Time For Goal Achievement: 07/25/12 Potential to Achieve Goals: Good ADL Goals Pt Will Perform Lower Body Bathing: with mod assist;Sitting, chair;Sitting, edge of bed;with adaptive equipment Pt Will Transfer to Toilet: with max assist;Squat pivot transfer;with DME;Extra wide 3-in-1;Drop arm 3-in-1;Maintaining weight bearing status ADL Goal: Toilet Transfer - Progress: Not progressing Miscellaneous OT Goals Miscellaneous OT Goal #1: Pt will perform bed mobility with min assist as precursor for EOB ADLs. OT Goal: Miscellaneous Goal #1 - Progress: Progressing toward goals  Visit Information  Last OT Received On: 07/14/12 Assistance Needed: +3 or more (sit to stand from chair) PT/OT Co-Evaluation/Treatment: Yes    Subjective Data      Prior Functioning       Cognition  Cognition Arousal/Alertness: Awake/alert Behavior During Therapy: WFL for tasks assessed/performed Overall Cognitive Status: Within Functional Limits for tasks assessed    Mobility  Bed Mobility Bed Mobility: Sit to Supine Sit to Supine: 3: Mod assist Details for Bed Mobility Assistance: pt requires mod (A) to control trunk while advancing L LE onto bed into supine position; pt relies heavily on rails; requires increased time to complete; demo labored breathing after completing bed mobility on 3L O2 Transfers Transfers: Sit to Stand;Stand to Sit Sit to Stand: 1: +2 Total assist;From chair/3-in-1;With upper  extremity assist;With armrests Sit to Stand: Patient Percentage: 20% Stand to Sit: 1: +2 Total assist;To bed;With upper extremity assist;With armrests Stand to Sit: Patient  Percentage: 20% Details for Transfer Assistance: attempted SPT with 2 + people, pt unable to maintain NWB status on L LE and was unsafe, required increased (A); pt required 4 people to (A) with SPT from recliner due to inability to maintain NWB status with L LE and balance; pt required max facilitation and cues for transfer; pt becomes very anxious and demo labored breathing; at end of transfer on RA pt O2 stat at 88%; placed back on 3L O2.   Exercises         End of Session OT - End of Session Equipment Utilized During Treatment: Gait belt;Other (comment) (O2, Bledsoe brace) Activity Tolerance: Patient limited by pain;Patient limited by fatigue Patient left: in bed;with call bell/phone within reach  GO     Earlie Raveling OTR/L [phone] 07/14/2012, 3:43 PM

## 2012-07-14 NOTE — Progress Notes (Signed)
Pt is refusing CPAP tonight. RT told pt that if he changed his mind to please call we will be here all night.

## 2012-07-14 NOTE — Progress Notes (Signed)
Subjective: 4 Days Post-Op Procedure(s) (LRB): IM FEMORAL NAIL (Left) Patient reports pain as mild.  No n/v/f/c.  Objective: Vital signs in last 24 hours: Temp:  [98.6 F (37 C)-99.4 F (37.4 C)] 99.4 F (37.4 C) (06/18 0552) Pulse Rate:  [75-80] 80 (06/18 0552) Resp:  [16] 16 (06/18 0552) BP: (125-170)/(67-97) 170/97 mmHg (06/18 0552) SpO2:  [96 %] 96 % (06/18 0552) Weight:  [122.471 kg (270 lb)] 122.471 kg (270 lb) (06/17 1422)  Intake/Output from previous day: 06/17 0701 - 06/18 0700 In: 800 [P.O.:800] Out: 2450 [Urine:2450] Intake/Output this shift: Total I/O In: -  Out: 400 [Urine:400]   Recent Labs  07/13/12 0450 07/14/12 0350  HGB 7.0* 7.7*    Recent Labs  07/13/12 0450 07/14/12 0350  WBC 15.4* 13.2*  RBC 2.42* 2.67*  HCT 21.2* 23.3*  PLT 96* 113*    Recent Labs  07/13/12 0450 07/14/12 0350  NA 134* 135  K 4.1 3.8  CL 99 98  CO2 23 25  BUN 84* 88*  CREATININE 4.06* 3.84*  GLUCOSE 101* 123*  CALCIUM 8.4 8.6   No results found for this basename: LABPT, INR,  in the last 72 hours  L LE NVI.  ROM brace in place.  Dressing dry.  Assessment/Plan: 4 Days Post-Op Procedure(s) (LRB): IM FEMORAL NAIL (Left) Up with therapy OK for d/c to snf at any time.  Toni Arthurs 07/14/2012, 12:30 PM

## 2012-07-14 NOTE — Care Management Note (Signed)
CARE MANAGEMENT NOTE 07/14/2012  Patient:  Chad [name], Chad [name]   Account Number:  [id number]  Date Initiated:  07/12/2012  Documentation initiated by:  Chad [name]  Subjective/Objective Assessment:   58 yr old male s/pleft femur IM nailing     Action/Plan:   Patient is evaluated for inpatient rehab . CM will follow.   Anticipated DC Date:  07/15/2012   Anticipated DC Plan:  HOME W HOME HEALTH SERVICES  In-house referral  Clinical Social Worker      DC Planning Services  CM consult      PAC Choice  DURABLE MEDICAL EQUIPMENT  HOME HEALTH   Choice offered to / List presented to:  [name]   DME arranged  3-N-1  WHEELCHAIR - MANUAL      DME agency  [facility]     HH arranged  HH-1 RN  HH-2 PT      [facility] agency  [facility]   Status of service:  Completed, signed off Medicare Important Message given?   (If response is "NO", the following Medicare IM given date fields will be blank) Date Medicare IM given:   Date Additional Medicare IM given:    Discharge Disposition:  HOME W HOME HEALTH SERVICES  Per UR Regulation:    If discussed at Long Length of Stay Meetings, dates discussed:    Comments:  07/14/12 3:00pm Chad Peper, RN BSN NCM CM and Dr. Ardyth Harps spoke with patient concerning home health and DME needs. Concerned with patient going home rather than SNF. patient has no medical coverage at this time and he doesnt qualify for indigent. Patient states he has friends and family that will help assist him at discharge. CIR doesnt have an available bed.

## 2012-07-14 NOTE — Progress Notes (Signed)
[name] PROGRESS NOTE  Chad [name] ZOX:[id number] DOB: 09-02-1955 DOA: 07/09/2012 PCP: [name], MD  Assessment/Plan: 1. Fx of Left Distal Femur  -Ortho: Dr. [name] following  -S/p ORIF of L distal femur fx  -Pain Control and knee Immobilizer per Ortho for now.  2. CKD and Renal Tx  -Monitor BUN.Cr  -Chart reviewed. Pt noted to have hx of stage 4 ckd with baseline Cr of around 3.5 as of 2/14.  -Continue Rappimmune Rx w/ prednisone  -Nephro following Followed by Dr. Caryn Section  -Cr improved today to 3.84.  3. HTN  - Cont current regimen - Well controlled   4. Cardiomyopathy/ and Systolic CHF  - Furosemide IV when needed and monitor for signs of fluid overload, currently compensated.   5. PAF  -Rate controlled.  -On home PO meds   6. Hyperglycemia  - SSI coverage PRN  -Controlled  7. Anemia  - due to chronic disease and Renal Disease. -Has been transfused 1 unit of PRBCs.  8. SCDs for DVT prophylaxis.   9. Possible HCAP - On empiric abx for questionable HCAP in the setting of immunocompromised state.  -Afebrile with WBCs trending down. -Would continue on empiric abx for 7 days ( can transition to PO at time of DC).  Code Status: Full Family Communication: Patient only Disposition Plan: Discussed in detail with patient, PT and CM at bedside. CIR without beds, cannot afford SNF. Only option appears to be home at this point, which we feel is not the ideal situation. HH will be maxed out. Anticipate DC home in 24 hours unless any acute changes overnight.  Consultants:  Orthopedics  Nephrology  Procedures:  ORIF of L distal femur fracture on 07/10/12  Antibiotics: -Cefepime -Vanc  HPI/Subjective: No acute complaints.  Objective: Filed Vitals:   07/13/12 1824 07/13/12 2009 07/14/12 0552 07/14/12 1319  BP: 125/78 134/76 170/97 182/92  Pulse:  75 80 80  Temp:  98.6 F (37 C) 99.4 F (37.4 C) 98.7 F (37.1 C)  TempSrc:  Oral Oral Oral  Resp:   16 16 18   Height:      Weight:      SpO2:  96% 96% 95%    Intake/Output Summary (Last 24 hours) at 07/14/12 1442 Last data filed at 07/14/12 1320  Gross per 24 hour  Intake    720 ml  Output   2550 ml  Net  -1830 ml   Filed Weights   07/13/12 1422  Weight: 122.471 kg (270 lb)    Exam:   General:  Awake, in nad  Cardiovascular: regular, s1, s2  Respiratory: normal resp effort, no wheezing  Abdomen: soft, nondistended  Musculoskeletal: perfused, no clubbing   Data Reviewed: Basic Metabolic Panel:  Recent Labs Lab 07/10/12 0540 07/10/12 1550 07/11/12 0525 07/12/12 0440 07/13/12 0450 07/14/12 0350  NA 137  --  135 133* 134* 135  K 3.4*  --  3.3* 3.8 4.1 3.8  CL 99  --  99 97 99 98  CO2 24  --  25 23 23 25   GLUCOSE 119*  --  105* 135* 101* 123*  BUN 92*  --  79* 82* 84* 88*  CREATININE 4.04* 4.15* 3.83* 4.26* 4.06* 3.84*  CALCIUM 8.7  --  8.0* 8.2* 8.4 8.6  PHOS  --   --   --   --  6.4* 5.7*   Liver Function Tests:  Recent Labs Lab 07/11/12 0525 07/13/12 0450 07/14/12 0350  AST 21  --   --  ALT 7  --   --   ALKPHOS 48  --   --   BILITOT 0.3  --   --   PROT 5.7*  --   --   ALBUMIN 2.6* 2.2* 2.4*   No results found for this basename: LIPASE, AMYLASE,  in the last 168 hours No results found for this basename: AMMONIA,  in the last 168 hours CBC:  Recent Labs Lab 07/09/12 2310 07/10/12 0540 07/10/12 1550 07/11/12 0525 07/13/12 0450 07/14/12 0350  WBC 12.6* 15.2* 13.7* 18.2* 15.4* 13.2*  NEUTROABS 10.6*  --   --  13.6*  --   --   HGB 11.4* 11.2* 9.4* 8.3* 7.0* 7.7*  HCT 34.8* 35.0* 29.0* 26.3* 21.2* 23.3*  MCV 88.1 87.9 89.0 89.8 87.6 87.3  PLT 140* 126* 114* 113* 96* 113*   Cardiac Enzymes: No results found for this basename: CKTOTAL, CKMB, CKMBINDEX, TROPONINI,  in the last 168 hours BNP (last 3 results)  Recent Labs  12/30/11 1057 03/12/12 0449 03/15/12 0440  PROBNP 534.0* 14215.0* 30340.0*   CBG:  Recent Labs Lab  07/13/12 2008 07/14/12 0002 07/14/12 0353 07/14/12 0641 07/14/12 1130  GLUCAP 167* 145* 138* 121* 124*    Recent Results (from the past 240 hour(s))  SURGICAL PCR SCREEN     Status: None   Collection Time    07/10/12  2:42 AM      Result Value Range Status   MRSA, PCR NEGATIVE  NEGATIVE Final   Staphylococcus aureus NEGATIVE  NEGATIVE Final   Comment:            The Xpert SA Assay (FDA     approved for NASAL specimens     in patients over 83 years of age),     is one component of     a comprehensive surveillance     program.  Test performance has     been validated by [employer] for patients greater     than or equal to 75 year old.     It is not intended     to diagnose infection nor to     guide or monitor treatment.  CULTURE, BLOOD (ROUTINE X 2)     Status: None   Collection Time    07/12/12  9:15 AM      Result Value Range Status   Specimen Description BLOOD LEFT ARM   Final   Special Requests     Final   Value: BOTTLES DRAWN AEROBIC AND ANAEROBIC AERO 10CC ANA 7CC   Culture  Setup Time 07/12/2012 14:27   Final   Culture     Final   Value:        BLOOD CULTURE RECEIVED NO GROWTH TO DATE CULTURE WILL BE HELD FOR 5 DAYS BEFORE ISSUING A FINAL NEGATIVE REPORT   Report Status PENDING   Incomplete  CULTURE, BLOOD (ROUTINE X 2)     Status: None   Collection Time    07/12/12  9:20 AM      Result Value Range Status   Specimen Description BLOOD LEFT HAND   Final   Special Requests BOTTLES DRAWN AEROBIC AND ANAEROBIC 10CC   Final   Culture  Setup Time 07/12/2012 14:27   Final   Culture     Final   Value:        BLOOD CULTURE RECEIVED NO GROWTH TO DATE CULTURE WILL BE HELD FOR 5 DAYS BEFORE ISSUING A FINAL NEGATIVE REPORT  Report Status PENDING   Incomplete     Studies: No results found.  Scheduled Meds: . allopurinol  100 mg Oral BID  . amiodarone  200 mg Oral Daily  . aspirin EC  81 mg Oral Daily  . atorvastatin  20 mg Oral q1800  . carvedilol  25 mg Oral  BID WC  . ceFEPime (MAXIPIME) IV  1 g Intravenous Q24H  . darbepoetin (ARANESP) injection - NON-DIALYSIS  100 mcg Subcutaneous Q Mon-1800  . docusate sodium  100 mg Oral BID  . enoxaparin (LOVENOX) injection  30 mg Subcutaneous Q24H  . furosemide  80 mg Oral BID  . hydrALAZINE  25 mg Oral TID  . insulin aspart  0-9 Units Subcutaneous TID AC & HS  . isosorbide mononitrate  60 mg Oral Daily  . potassium chloride  10 mEq Oral Daily  . predniSONE  10 mg Oral Q breakfast  . senna  2 tablet Oral BID  . sirolimus  1.2 mg Oral Daily  . vancomycin  1,500 mg Intravenous Q48H   Continuous Infusions: . sodium chloride 20 mL/hr at 07/14/12 1610    Principal Problem:   Fracture, femur, distal Active Problems:   Cardiomyopathy   Chronic systolic CHF (congestive heart failure)   CKD (chronic kidney disease)   Hypertension   PAF (paroxysmal atrial fibrillation)    Time spent: 35 minutes    HERNANDEZ ACOSTA,ESTELA  [name] Pager [phone].  If 7PM-7AM, please contact night-coverage at [email or web address], password [facility] 07/14/2012, 2:42 PM  LOS: 5 days

## 2012-07-14 NOTE — Progress Notes (Signed)
Physical Therapy Treatment Patient Details Name: Chad [name] MRN: [id number] DOB: 1955-08-28 Today's Date: 07/14/2012 Time: 2536-6440 PT Time Calculation (min): 28 min  PT Assessment / Plan / Recommendation Comments on Treatment Session  Pt highly motivated to return to PLOF. In today's session was very concerned by the D/C plan to go home. PT had multiple lengthy conversations with case manager, MD and pt about D/C needs and safety. At this time pt requires 2+ to 4 people (A) for mobility and transfers and is unsafe to D/C home. Pt would be a great candidate for CIR to reduce caregiver burden and increase independence. Next session plan to educate and practice lateral and ant/posterior transfers wheelchair <> bed <> drop arm commode. If pt D/C home pt will need hospital bed, drop arm commode, wheelchair, sliding board and lift chair. Along with 24/7 care and (A) and HHPT.     Follow Up Recommendations  CIR;Supervision/Assistance - 24 hour     Does the patient have the potential to tolerate intense rehabilitation     Barriers to Discharge        Equipment Recommendations  Rolling walker with 5" wheels;Wheelchair (measurements PT);Wheelchair cushion (measurements PT);Hospital bed (3 in 1 drop arm commode, sliding board)    Recommendations for Other Services Rehab consult  Frequency Min 5X/week   Plan Discharge plan remains appropriate    Precautions / Restrictions Precautions Precautions: Fall Required Braces or Orthoses: Other Brace/Splint Other Brace/Splint: Bledsoe brace  Restrictions LLE Weight Bearing: Non weight bearing   Pertinent Vitals/Pain 5-6/10 after session; pt premedicated prior to session.     Mobility  Bed Mobility Bed Mobility: Sit to Supine Sit to Supine: 3: Mod assist Details for Bed Mobility Assistance: pt requires mod (A) to control trunk while advancing L LE onto bed into supine position; pt relies heavily on rails; requires increased time to complete;  demo labored breathing after completing bed mobility on 3L O2 Transfers Transfers: Sit to Stand;Stand Pivot Transfers;Stand to Sit Sit to Stand: 1: +2 Total assist;From chair/3-in-1;With upper extremity assist;With armrests Sit to Stand: Patient Percentage: 20% Stand to Sit: 1: +2 Total assist;To bed;With upper extremity assist;With armrests Stand to Sit: Patient Percentage: 20% Stand Pivot Transfers: 1: +2 Total assist;With armrests Stand Pivot Transfers: Patient Percentage: 20% Details for Transfer Assistance: attempted SPT with 2 + people, pt unable to maintain NWB status on L LE and was unsafe, required increased (A); pt required 4 people to (A) with SPT from recliner due to inability to maintain NWB status with L LE and balance; pt required max facilitation and cues for transfer; pt becomes very anxious and demo labored breathing; at end of transfer on RA pt O2 stat at 88%; placed back on 3L O2 Ambulation/Gait Ambulation/Gait Assistance: Not tested (comment) (unsafe to attempt) Stairs: No Wheelchair Mobility Wheelchair Mobility: No    Exercises     PT Diagnosis:    PT Problem List:   PT Treatment Interventions:     PT Goals Acute Rehab PT Goals PT Goal Formulation: With patient Time For Goal Achievement: 07/25/12 Potential to Achieve Goals: Good PT Goal: Sit to Supine/Side - Progress: Progressing toward goal PT Goal: Sit to Stand - Progress: Progressing toward goal PT Goal: Stand to Sit - Progress: Progressing toward goal PT Transfer Goal: Bed to Chair/Chair to Bed - Progress: Progressing toward goal  Visit Information  Last PT Received On: 07/14/12 Assistance Needed: +3 or more (sit to stand from chair ) PT/OT Co-Evaluation/Treatment: Yes  Subjective Data  Subjective: Pt sitting in chair; concerned with D/C plan  Patient Stated Goal: to not need as much help    Cognition  Cognition Arousal/Alertness: Awake/alert Behavior During Therapy: WFL for tasks  assessed/performed Overall Cognitive Status: Within Functional Limits for tasks assessed    Balance  Balance Balance Assessed: Yes Static Standing Balance Static Standing - Balance Support: Bilateral upper extremity supported;During functional activity Static Standing - Level of Assistance: 1: +2 Total assist  End of Session PT - End of Session Equipment Utilized During Treatment: Gait belt;Oxygen Activity Tolerance: Patient limited by fatigue;Patient limited by pain Patient left: in bed;with call bell/phone within reach Nurse Communication: Mobility status   GP     Donell Sievert, [place] [phone] 07/14/2012, 3:26 PM

## 2012-07-14 NOTE — Progress Notes (Signed)
Inpt rehab bed is not available today. Patient is aware. [phone]

## 2012-07-14 NOTE — Progress Notes (Signed)
I will follow up in the morning with bed availability for inpt rehab. [phone]

## 2012-07-14 NOTE — Progress Notes (Signed)
SW was informed that patient's Medicare is inactive and patient does not qualify for medicaid. SW spoke with patient about this and informed him that he would have to pay privately for a facility. At this time, patient wishes to return home  With [facility]. SW will inform CM of this. Clinical Social Worker will sign off for now as social work intervention is no longer needed. Please consult [place] again if new need arises.   Sabino Niemann, MSW, [phone]

## 2012-07-14 NOTE — Progress Notes (Signed)
Patient BP 182/92. PRN hydralazine ordered, but patient has no current IV access. Will administer 10mg  Iv Hydralazine once iv access is regained.

## 2012-07-14 NOTE — Progress Notes (Signed)
Subjective:  Big issue with post hospital care No rehab beds, would have to pay out of pocket for SNF, but lives alone and PT feels not good option Leg hurts more today Transfused a unit of blood yesterday  Objective Vital signs in last 24 hours: Filed Vitals:   07/13/12 1824 07/13/12 2009 07/14/12 0552 07/14/12 1319  BP: 125/78 134/76 170/97 182/92  Pulse:  75 80 80  Temp:  98.6 F (37 C) 99.4 F (37.4 C) 98.7 F (37.1 C)  TempSrc:  Oral Oral Oral  Resp:  16 16 18   Height:      Weight:      SpO2:  96% 96% 95%   Weight change:   Intake/Output Summary (Last 24 hours) at 07/14/12 1351 Last data filed at 07/14/12 1320  Gross per 24 hour  Intake    720 ml  Output   2550 ml  Net  -1830 ml   Physical Exam: BP 182/92  Pulse 80  Temp(Src) 98.7 F (37.1 C) (Oral)  Resp 18  Ht 6\' 1"  (1.854 m)  Wt 122.471 kg (270 lb)  BMI 35.63 kg/m2  SpO2 95% NAD Lungs  clear anteriorly S1S2 no S3 Irreg Abdomen large no focal tenderness  Right renal allograft non tender Left leg in brace Right leg no sig edema/wering TED stocking  Labs: Basic Metabolic Panel:  Recent Labs Lab 07/09/12 2310 07/10/12 0540 07/10/12 1550 07/11/12 0525 07/12/12 0440 07/13/12 0450 07/14/12 0350  NA 135 137  --  135 133* 134* 135  K 3.6 3.4*  --  3.3* 3.8 4.1 3.8  CL 97 99  --  99 97 99 98  CO2 26 24  --  25 23 23 25   GLUCOSE 170* 119*  --  105* 135* 101* 123*  BUN 92* 92*  --  79* 82* 84* 88*  CREATININE 4.17* 4.04* 4.15* 3.83* 4.26* 4.06* 3.84*  CALCIUM 8.7 8.7  --  8.0* 8.2* 8.4 8.6  PHOS  --   --   --   --   --  6.4* 5.7*   Liver Function Tests:  Recent Labs Lab 07/11/12 0525 07/13/12 0450 07/14/12 0350  AST 21  --   --   ALT 7  --   --   ALKPHOS 48  --   --   BILITOT 0.3  --   --   PROT 5.7*  --   --   ALBUMIN 2.6* 2.2* 2.4*    Recent Labs Lab 07/09/12 2310  07/10/12 1550 07/11/12 0525 07/13/12 0450 07/14/12 0350  WBC 12.6*  < > 13.7* 18.2* 15.4* 13.2*  NEUTROABS 10.6*   --   --  13.6*  --   --   HGB 11.4*  < > 9.4* 8.3* 7.0* 7.7*  HCT 34.8*  < > 29.0* 26.3* 21.2* 23.3*  MCV 88.1  < > 89.0 89.8 87.6 87.3  PLT 140*  < > 114* 113* 96* 113*  < > = values in this interval not displayed.  Recent Labs Lab 07/13/12 2008 07/14/12 0002 07/14/12 0353 07/14/12 0641 07/14/12 1130  GLUCAP 167* 145* 138* 121* 124*    Recent Labs Lab 07/10/12 0540  IRON 39*  TIBC 235  FERRITIN 121   Studies/Results: No results found. Medications: . sodium chloride 20 mL/hr at 07/14/12 0645   . allopurinol  100 mg Oral BID  . amiodarone  200 mg Oral Daily  . aspirin EC  81 mg Oral Daily  . atorvastatin  20 mg  Oral q1800  . carvedilol  25 mg Oral BID WC  . ceFEPime (MAXIPIME) IV  1 g Intravenous Q24H  . darbepoetin (ARANESP) injection - NON-DIALYSIS  100 mcg Subcutaneous Q Mon-1800  . docusate sodium  100 mg Oral BID  . enoxaparin (LOVENOX) injection  30 mg Subcutaneous Q24H  . furosemide  80 mg Oral BID  . hydrALAZINE  25 mg Oral TID  . insulin aspart  0-9 Units Subcutaneous TID AC & HS  . isosorbide mononitrate  60 mg Oral Daily  . potassium chloride  10 mEq Oral Daily  . predniSONE  10 mg Oral Q breakfast  . senna  2 tablet Oral BID  . sirolimus  1.2 mg Oral Daily  . vancomycin  1,500 mg Intravenous Q48H    I  have reviewed scheduled and prn medications.  ASSESSMENT/RECOMMENDATIONS: 57 yo AA male with history of hypertension, hepatitis C, chronic kidney disease with renal transplant 1996 with baseline creatinine around 3.5 (3.42 on 06/30/12)  and left hip replacement, admitted 07/10/2012 after fall landing on his left knee. X-rays and imaging revealed left intra-articular distal femur fracture. Underwent ORIF of left distal femur fracture 07/10/2012 by Dr. [name].  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 Now doing better     ABLA with Hb down to 8.3 post op, further reduced to 7 (6/17) Iron low Dosed Feraheme X 1 6/15 Started Darbe 100/week (6/16) Transfused 1 unit  (6/17)with lasix 80 pre  Cardiomyopathy EF 30-35  AFib  On amio  HepC +   Camille Bal, MD [facility] [phone] pager 07/14/2012, 1:51 PM

## 2012-07-15 ENCOUNTER — Inpatient Hospital Stay (HOSPITAL_COMMUNITY)
Admission: AD | Admit: 2012-07-15 | Discharge: 2012-08-05 | DRG: 945 | Disposition: A | Discharge status: Home Health | Payer: Medicaid Other | Source: Intra-hospital | Attending: Physical Medicine & Rehabilitation | Admitting: Physical Medicine & Rehabilitation

## 2012-07-15 DIAGNOSIS — E669 Obesity, unspecified: Secondary | ICD-10-CM

## 2012-07-15 DIAGNOSIS — G4733 Obstructive sleep apnea (adult) (pediatric): Secondary | ICD-10-CM

## 2012-07-15 DIAGNOSIS — I5023 Acute on chronic systolic (congestive) heart failure: Secondary | ICD-10-CM

## 2012-07-15 DIAGNOSIS — Z940 Kidney transplant status: Secondary | ICD-10-CM

## 2012-07-15 DIAGNOSIS — L03116 Cellulitis of left lower limb: Secondary | ICD-10-CM

## 2012-07-15 DIAGNOSIS — S72499A Other fracture of lower end of unspecified femur, initial encounter for closed fracture: Secondary | ICD-10-CM

## 2012-07-15 DIAGNOSIS — D62 Acute posthemorrhagic anemia: Secondary | ICD-10-CM

## 2012-07-15 DIAGNOSIS — W010XXA Fall on same level from slipping, tripping and stumbling without subsequent striking against object, initial encounter: Secondary | ICD-10-CM

## 2012-07-15 DIAGNOSIS — I509 Heart failure, unspecified: Secondary | ICD-10-CM

## 2012-07-15 DIAGNOSIS — S7292XS Unspecified fracture of left femur, sequela: Secondary | ICD-10-CM

## 2012-07-15 DIAGNOSIS — I10 Essential (primary) hypertension: Secondary | ICD-10-CM

## 2012-07-15 DIAGNOSIS — M109 Gout, unspecified: Secondary | ICD-10-CM

## 2012-07-15 DIAGNOSIS — I129 Hypertensive chronic kidney disease with stage 1 through stage 4 chronic kidney disease, or unspecified chronic kidney disease: Secondary | ICD-10-CM

## 2012-07-15 DIAGNOSIS — Y9210 Unspecified residential institution as the place of occurrence of the external cause: Secondary | ICD-10-CM

## 2012-07-15 DIAGNOSIS — N179 Acute kidney failure, unspecified: Secondary | ICD-10-CM

## 2012-07-15 DIAGNOSIS — E119 Type 2 diabetes mellitus without complications: Secondary | ICD-10-CM

## 2012-07-15 DIAGNOSIS — E876 Hypokalemia: Secondary | ICD-10-CM

## 2012-07-15 DIAGNOSIS — R112 Nausea with vomiting, unspecified: Secondary | ICD-10-CM

## 2012-07-15 DIAGNOSIS — Z905 Acquired absence of kidney: Secondary | ICD-10-CM

## 2012-07-15 DIAGNOSIS — N184 Chronic kidney disease, stage 4 (severe): Secondary | ICD-10-CM

## 2012-07-15 DIAGNOSIS — Z5189 Encounter for other specified aftercare: Principal | ICD-10-CM

## 2012-07-15 DIAGNOSIS — Z6837 Body mass index (BMI) 37.0-37.9, adult: Secondary | ICD-10-CM

## 2012-07-15 DIAGNOSIS — R0902 Hypoxemia: Secondary | ICD-10-CM

## 2012-07-15 DIAGNOSIS — I4891 Unspecified atrial fibrillation: Secondary | ICD-10-CM

## 2012-07-15 DIAGNOSIS — S7292XA Unspecified fracture of left femur, initial encounter for closed fracture: Secondary | ICD-10-CM

## 2012-07-15 DIAGNOSIS — Y92009 Unspecified place in unspecified non-institutional (private) residence as the place of occurrence of the external cause: Secondary | ICD-10-CM

## 2012-07-15 DIAGNOSIS — M62838 Other muscle spasm: Secondary | ICD-10-CM

## 2012-07-15 DIAGNOSIS — IMO0002 Reserved for concepts with insufficient information to code with codable children: Secondary | ICD-10-CM

## 2012-07-15 DIAGNOSIS — I5022 Chronic systolic (congestive) heart failure: Secondary | ICD-10-CM

## 2012-07-15 DIAGNOSIS — Z79899 Other long term (current) drug therapy: Secondary | ICD-10-CM

## 2012-07-15 DIAGNOSIS — I428 Other cardiomyopathies: Secondary | ICD-10-CM

## 2012-07-15 DIAGNOSIS — E785 Hyperlipidemia, unspecified: Secondary | ICD-10-CM

## 2012-07-15 DIAGNOSIS — Z8249 Family history of ischemic heart disease and other diseases of the circulatory system: Secondary | ICD-10-CM

## 2012-07-15 DIAGNOSIS — Z96649 Presence of unspecified artificial hip joint: Secondary | ICD-10-CM

## 2012-07-15 DIAGNOSIS — Z602 Problems related to living alone: Secondary | ICD-10-CM

## 2012-07-15 DIAGNOSIS — S72453A Displaced supracondylar fracture without intracondylar extension of lower end of unspecified femur, initial encounter for closed fracture: Secondary | ICD-10-CM

## 2012-07-15 DIAGNOSIS — B1920 Unspecified viral hepatitis C without hepatic coma: Secondary | ICD-10-CM

## 2012-07-15 DIAGNOSIS — T40605A Adverse effect of unspecified narcotics, initial encounter: Secondary | ICD-10-CM

## 2012-07-15 DIAGNOSIS — Z7982 Long term (current) use of aspirin: Secondary | ICD-10-CM

## 2012-07-15 LAB — GLUCOSE, CAPILLARY
Glucose-Capillary: 99 mg/dL (ref 70–99)
Glucose-Capillary: 139 mg/dL — ABNORMAL HIGH (ref 70–99)

## 2012-07-15 LAB — CBC
Hemoglobin: 7.6 g/dL — ABNORMAL LOW (ref 13.0–17.0)
MCH: 28.3 pg (ref 26.0–34.0)
MCH: 28.9 pg (ref 26.0–34.0)
MCHC: 31.8 g/dL (ref 30.0–36.0)
MCV: 89.1 fL (ref 78.0–100.0)
Platelets: 130 10*3/uL — ABNORMAL LOW (ref 150–400)
Platelets: 142 10*3/uL — ABNORMAL LOW (ref 150–400)
RBC: 2.65 MIL/uL — ABNORMAL LOW (ref 4.22–5.81)
RBC: 2.63 MIL/uL — ABNORMAL LOW (ref 4.22–5.81)
RDW: 17.7 % — ABNORMAL HIGH (ref 11.5–15.5)
WBC: 14 10*3/uL — ABNORMAL HIGH (ref 4.0–10.5)

## 2012-07-15 LAB — CREATININE, SERUM
Creatinine, Ser: 3.54 mg/dL — ABNORMAL HIGH (ref 0.50–1.35)
GFR calc non Af Amer: 18 mL/min — ABNORMAL LOW (ref >90)

## 2012-07-15 LAB — RENAL FUNCTION PANEL
Albumin: 2.2 g/dL — ABNORMAL LOW (ref 3.5–5.2)
BUN: 82 mg/dL — ABNORMAL HIGH (ref 6–23)
Calcium: 8.5 mg/dL (ref 8.4–10.5)
Creatinine, Ser: 3.6 mg/dL — ABNORMAL HIGH (ref 0.50–1.35)
Phosphorus: 4.8 mg/dL — ABNORMAL HIGH (ref 2.3–4.6)

## 2012-07-15 MED ORDER — OXYCODONE HCL 5 MG PO TABS
5.0000 mg | ORAL_TABLET | ORAL | Status: DC | PRN
  Administered 2012-07-15 – 2012-07-20 (×14): 10 mg via ORAL
  Administered 2012-07-21: 5 mg via ORAL
  Administered 2012-07-21 – 2012-07-23 (×8): 10 mg via ORAL
  Administered 2012-07-25 (×2): 5 mg via ORAL
  Administered 2012-07-26: 10 mg via ORAL
  Administered 2012-07-27: 5 mg via ORAL
  Administered 2012-07-27 (×2): 10 mg via ORAL
  Administered 2012-07-28: 5 mg via ORAL
  Administered 2012-07-28 – 2012-08-05 (×13): 10 mg via ORAL
  Filled 2012-07-15 (×21): qty 2
  Filled 2012-07-15: qty 1
  Filled 2012-07-15: qty 3
  Filled 2012-07-15 (×18): qty 2
  Filled 2012-07-15: qty 1
  Filled 2012-07-15: qty 2
  Filled 2012-07-15: qty 1
  Filled 2012-07-15 (×3): qty 2

## 2012-07-15 MED ORDER — POTASSIUM CHLORIDE CRYS ER 10 MEQ PO TBCR
10.0000 meq | EXTENDED_RELEASE_TABLET | Freq: Every day | ORAL | Status: DC
  Administered 2012-07-16 – 2012-07-17 (×2): 10 meq via ORAL
  Filled 2012-07-15 (×4): qty 1

## 2012-07-15 MED ORDER — ENOXAPARIN SODIUM 30 MG/0.3ML ~~LOC~~ SOLN
30.0000 mg | SUBCUTANEOUS | Status: DC
  Administered 2012-07-15 – 2012-08-04 (×21): 30 mg via SUBCUTANEOUS
  Filled 2012-07-15 (×22): qty 0.3

## 2012-07-15 MED ORDER — ENOXAPARIN SODIUM 30 MG/0.3ML ~~LOC~~ SOLN: 30.0000 mg | SUBCUTANEOUS | Status: DC

## 2012-07-15 MED ORDER — ASPIRIN EC 81 MG PO TBEC
81.0000 mg | DELAYED_RELEASE_TABLET | Freq: Every day | ORAL | Status: DC
Start: 2012-07-16 — End: 2012-08-05
  Administered 2012-07-16 – 2012-08-05 (×21): 81 mg via ORAL
  Filled 2012-07-15 (×23): qty 1

## 2012-07-15 MED ORDER — ACETAMINOPHEN 325 MG PO TABS
325.0000 mg | ORAL_TABLET | ORAL | Status: DC | PRN
  Administered 2012-07-18: 650 mg via ORAL
  Filled 2012-07-15: qty 2

## 2012-07-15 MED ORDER — AMIODARONE HCL 200 MG PO TABS
200.0000 mg | ORAL_TABLET | Freq: Every day | ORAL | Status: DC
  Administered 2012-07-16 – 2012-08-05 (×21): 200 mg via ORAL
  Filled 2012-07-15 (×25): qty 1

## 2012-07-15 MED ORDER — CARVEDILOL 25 MG PO TABS
25.0000 mg | ORAL_TABLET | Freq: Two times a day (BID) | ORAL | Status: DC
Start: 2012-07-15 — End: 2012-08-05
  Administered 2012-07-15 – 2012-08-05 (×42): 25 mg via ORAL
  Filled 2012-07-15 (×47): qty 1

## 2012-07-15 MED ORDER — DOCUSATE SODIUM 100 MG PO CAPS
100.0000 mg | ORAL_CAPSULE | Freq: Two times a day (BID) | ORAL | Status: DC
  Administered 2012-07-15 – 2012-08-05 (×39): 100 mg via ORAL
  Filled 2012-07-15 (×46): qty 1

## 2012-07-15 MED ORDER — FUROSEMIDE 80 MG PO TABS
80.0000 mg | ORAL_TABLET | Freq: Two times a day (BID) | ORAL | Status: DC
  Administered 2012-07-15 – 2012-07-22 (×14): 80 mg via ORAL
  Filled 2012-07-15 (×16): qty 1

## 2012-07-15 MED ORDER — DARBEPOETIN ALFA-POLYSORBATE 100 MCG/0.5ML IJ SOLN
100.0000 ug | INTRAMUSCULAR | Status: DC
  Administered 2012-07-19 – 2012-08-02 (×3): 100 ug via SUBCUTANEOUS
  Filled 2012-07-15 (×5): qty 0.5

## 2012-07-15 MED ORDER — HYDRALAZINE HCL 25 MG PO TABS
25.0000 mg | ORAL_TABLET | Freq: Three times a day (TID) | ORAL | Status: DC
  Administered 2012-07-15 – 2012-07-16 (×3): 25 mg via ORAL
  Filled 2012-07-15 (×6): qty 1

## 2012-07-15 MED ORDER — ATORVASTATIN CALCIUM 20 MG PO TABS
20.0000 mg | ORAL_TABLET | Freq: Every day | ORAL | Status: DC
  Administered 2012-07-15 – 2012-08-04 (×21): 20 mg via ORAL
  Filled 2012-07-15 (×22): qty 1

## 2012-07-15 MED ORDER — ONDANSETRON HCL 4 MG/2ML IJ SOLN: 4.0000 mg | Freq: Four times a day (QID) | INTRAMUSCULAR | Status: DC | PRN

## 2012-07-15 MED ORDER — ISOSORBIDE MONONITRATE ER 60 MG PO TB24
60.0000 mg | ORAL_TABLET | Freq: Every day | ORAL | Status: DC
  Administered 2012-07-16 – 2012-08-05 (×21): 60 mg via ORAL
  Filled 2012-07-15 (×24): qty 1

## 2012-07-15 MED ORDER — ALLOPURINOL 100 MG PO TABS
100.0000 mg | ORAL_TABLET | Freq: Two times a day (BID) | ORAL | Status: DC
  Administered 2012-07-15 – 2012-08-05 (×42): 100 mg via ORAL
  Filled 2012-07-15 (×47): qty 1

## 2012-07-15 MED ORDER — SIROLIMUS 1 MG/ML PO SOLN
1.2000 mg | Freq: Every day | ORAL | Status: DC
  Administered 2012-07-16: 1.2 mg via ORAL
  Filled 2012-07-15: qty 1.2

## 2012-07-15 MED ORDER — INSULIN ASPART 100 UNIT/ML ~~LOC~~ SOLN
0.0000 [IU] | Freq: Three times a day (TID) | SUBCUTANEOUS | Status: DC
  Administered 2012-07-15 – 2012-07-17 (×5): 1 [IU] via SUBCUTANEOUS
  Administered 2012-07-17: 2 [IU] via SUBCUTANEOUS
  Administered 2012-07-18 – 2012-07-20 (×6): 1 [IU] via SUBCUTANEOUS
  Administered 2012-07-20: 2 [IU] via SUBCUTANEOUS
  Administered 2012-07-21 – 2012-07-27 (×10): 1 [IU] via SUBCUTANEOUS

## 2012-07-15 MED ORDER — ONDANSETRON HCL 4 MG PO TABS
4.0000 mg | ORAL_TABLET | Freq: Four times a day (QID) | ORAL | Status: DC | PRN
  Administered 2012-07-22 – 2012-08-03 (×9): 4 mg via ORAL
  Filled 2012-07-15 (×10): qty 1

## 2012-07-15 MED ORDER — SORBITOL 70 % SOLN
30.0000 mL | Freq: Every day | Status: DC | PRN
  Administered 2012-07-15: 30 mL via ORAL
  Filled 2012-07-15 (×2): qty 30

## 2012-07-15 MED ORDER — PREDNISONE 10 MG PO TABS
10.0000 mg | ORAL_TABLET | Freq: Every day | ORAL | Status: DC
  Administered 2012-07-16 – 2012-08-05 (×21): 10 mg via ORAL
  Filled 2012-07-15 (×25): qty 1

## 2012-07-15 MED ORDER — SENNA 8.6 MG PO TABS
2.0000 | ORAL_TABLET | Freq: Two times a day (BID) | ORAL | Status: DC
  Administered 2012-07-15 – 2012-08-05 (×38): 17.2 mg via ORAL
  Filled 2012-07-15 (×22): qty 2
  Filled 2012-07-15: qty 1
  Filled 2012-07-15 (×6): qty 2
  Filled 2012-07-15: qty 1
  Filled 2012-07-15 (×20): qty 2

## 2012-07-15 NOTE — Discharge Summary (Signed)
Physician Discharge Summary  Chad [name] ZOX:[id number] DOB: 11/05/1955 DOA: 07/09/2012  PCP: [name], MD  Admit date: 07/09/2012 Discharge date: 07/15/2012  Time spent: Greater than 30 minutes  Recommendations for Outpatient Follow-up:  -Will be going to CIR today.   Discharge Diagnoses:  Principal Problem:   Fracture, femur, distal Active Problems:   Cardiomyopathy   Chronic systolic CHF (congestive heart failure)   CKD (chronic kidney disease)   Hypertension   PAF (paroxysmal atrial fibrillation)   Discharge Condition: Stable and improved  Filed Weights   07/13/12 1422  Weight: 122.471 kg (270 lb)    History of present illness:  Patient is a 57 y.o. male with multiple medical problems who slipped and fell in His home at around 9 pm. He denies syncope or dizziness, he does report having weakness in his left knee since surgery in past. He reports that the knee gave way. He was evaluated in the ED and was found to have an acute fracture of the distal femur and a chronic tibial plateau fracture on X-ray and Ct scan of the Left knee. We were asked to admit him for further evaluation and management with ortho support.   Hospital Course:   1. Fx of Left Distal Femur  -Ortho: Dr. [name] following  -S/p ORIF of L distal femur fx  -Pain Control and knee Immobilizer per Ortho for now.   2. CKD and Renal Tx  -Monitor BUN.Cr  -Chart reviewed. Pt noted to have hx of stage 4 ckd with baseline Cr of around 3.5 as of 2/14.  -Continue R[employer] Rx w/ prednisone  -Nephro following Followed by Dr. Caryn Section  -Cr improved today to 3.60.   3. HTN  - Cont current regimen  - Well controlled   4. Cardiomyopathy/ and Systolic CHF  - Furosemide IV when needed and monitor for signs of fluid overload, currently compensated.   5. PAF  -Rate controlled.  -On home PO meds   6. Hyperglycemia  - SSI coverage PRN  -Controlled   7. Anemia  - due to chronic disease and Renal Disease.   -Has been transfused 1 unit of PRBCs.   8. SCDs for DVT prophylaxis.   9. Possible HCAP  - On empiric abx for questionable HCAP in the setting of immunocompromised state.  -Afebrile with WBCs trending down.  -Would continue on empiric abx for 7 days.   Consultations:  Ortho, Dr. [name]  Discharge Instructions  Discharge Orders   Future Appointments Provider Department Dept Phone   08/30/2012 2:00 PM [facility] [phone]   08/30/2012 2:45 PM [name], MD [facility] [phone]   Future Orders Complete By Expires     Touch down weight bearing  As directed     Scheduling Instructions:      Left lower extremity.        Medication List    TAKE these medications       allopurinol 100 MG tablet  Commonly known as:  ZYLOPRIM  Take 200 mg by mouth 2 (two) times daily.     amiodarone 200 MG tablet  Commonly known as:  PACERONE  Take 200 mg by mouth daily.     aspirin EC 81 MG tablet  Take 81 mg by mouth daily.     carvedilol 25 MG tablet  Commonly known as:  COREG  Take 25 mg by mouth 2 (two) times daily with a meal.  enoxaparin 30 MG/0.3ML injection  Commonly known as:  LOVENOX  Inject 0.3 mLs (30 mg total) into the skin daily.     furosemide 80 MG tablet  Commonly known as:  LASIX  Take 1 tablet (80 mg total) by mouth 2 (two) times daily.     hydrALAZINE 50 MG tablet  Commonly known as:  APRESOLINE  Take 75 mg by mouth 3 (three) times daily. Takes 1.5 tablets     HYDROcodone-acetaminophen 5-325 MG per tablet  Commonly known as:  NORCO/VICODIN  Take 1-2 tablets by mouth every 4 (four) hours as needed for pain.     HYDROcodone-acetaminophen 5-325 MG per tablet  Commonly known as:  NORCO/VICODIN  Take 1-2 tablets by mouth every 4 (four) hours as needed.     isosorbide mononitrate 60 MG 24 hr tablet  Commonly known as:  IMDUR  Take 1 tablet (60 mg total) by mouth daily.      omeprazole 20 MG capsule  Commonly known as:  PRILOSEC  Take 20 mg by mouth daily.     potassium chloride 10 MEQ tablet  Commonly known as:  K-DUR,KLOR-CON  Take 10 mEq by mouth daily.     pravastatin 40 MG tablet  Commonly known as:  PRAVACHOL  Take 40 mg by mouth daily.     predniSONE 10 MG tablet  Commonly known as:  DELTASONE  Take 10 mg by mouth daily.     sirolimus 1 MG/ML solution  Commonly known as:  RAPAMUNE  Take 1.2 mg by mouth daily.       No Known Allergies     Follow-up Information   Schedule an appointment as soon as possible for a visit with Toni Arthurs, MD.   Contact information:   [address], [address] [place] [address] [phone]        The results of significant diagnostics from this hospitalization (including imaging, microbiology, ancillary and laboratory) are listed below for reference.    Significant Diagnostic Studies: Dg Pelvis 1-2 Views  07/09/2012   *RADIOLOGY REPORT*  Clinical Data: Left pelvic pain following a fall tonight.  PELVIS - 1-2 VIEW  Comparison: Abdomen and pelvis CT dated 08/09/2011.  Findings: Again demonstrated is a left total hip prosthesis with thinning and deformity of the acetabulum.  No fracture or dislocation seen.  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  No acute abnormality.   Original Report Authenticated By: Beckie Salts, M.D.   Dg Femur Left  07/10/2012   *RADIOLOGY REPORT*  Clinical Data: Open reduction and internal fixation of the left femur.  DG C-ARM 61-120 MIN,LEFT FEMUR - 2 VIEW  Comparison:  07/09/2012  Technique:  C-arm fluoroscopic images were obtained intraoperatively and submitted for postoperative interpretation. Please see the performing provider's procedural report for the fluoroscopy time utilized.  Findings: Intraoperative images were submitted.  Again noted is a left hip arthroplasty.  A lateral plate has been placed along the femur.   There are distal interlocking screws in the comminuted distal femur fracture. Knee appears to be located.  IMPRESSION: Plate and screw fixation of the comminuted distal femur fracture.   Original Report Authenticated By: Richarda Overlie, M.D.   Dg Femur Left  07/09/2012   *RADIOLOGY REPORT*  Clinical Data: Left leg pain following a fall tonight.  LEFT FEMUR - 2 VIEW  Comparison: Left knee radiographs and CT dated 03/30/2009 and 03/31/2009 respectively.  Pelvis radiograph obtained today.  Findings: Left total hip prosthesis  with the previously described chronic acetabular thinning and deformity.  Mildly impacted comminuted fracture of the distal femoral metaphysis without significant displacement or angulation.  Possible fracture of the posterior aspect of one of the femoral condyles, proximally. Diffuse soft tissue swelling at the level of the knee, most pronounced anteriorly.  Small knee joint effusion.  IMPRESSION:  1.  Comminuted, mildly impacted fracture of the distal femoral metaphysis and a possible fracture of the posterior aspect of one of the femoral condyles, proximally. 2.  Small effusion.   Original Report Authenticated By: Beckie Salts, M.D.   Dg Knee 1-2 Views Left  07/09/2012   *RADIOLOGY REPORT*  Clinical Data: Pain post fall.  LEFT KNEE - 1-2 VIEW  Comparison: 03/30/2009  Findings: Transverse comminuted fracture across the distal femoral metadiaphyseal region, impacted with several small fracture fragments.  No definite involvement of the femoral condyles on these two radiographs.  Probable depressed fracture of the lateral tibial plateau.  IMPRESSION: 1. Transverse comminuted fracture, distal femur. 2.  Probable depressed lateral tibial plateau fracture.   Original Report Authenticated By: D. Andria Rhein, MD   Ct Knee Left Wo Contrast  07/10/2012   *RADIOLOGY REPORT*  Clinical Data: Pain post fall.  CT OF THE LEFT KNEE WITHOUT CONTRAST  Technique:  Multidetector CT imaging was performed according  to the standard protocol. Multiplanar CT image reconstructions were also generated.  Comparison: Radiographs from the previous day, and earlier studies  Findings: Comminuted fracture of the distal femoral metadiaphyseal region extending to involve the anterior subchondral cortex of both femoral condyles, with a fracture   component in the sagittal plane extending to the intercondylar notch.  There is impaction of the proximal shaft onto the fracture fragments, with anterior angulation of the distal fracture fragment complex.  There is an old avulsion fracture deformity of the posterior proximal tibia at the level of the PCL.  No new tibial plateau fracture is evident. Hemarthrosis is evident as well as regional soft tissue swelling.  IMPRESSION: 1.  Comminuted intra-articular distal femur fracture as above. 2.  Chronic fracture deformity of the posterior tibia at the PCL insertion.   Original Report Authenticated By: D. Andria Rhein, MD   Dg Chest Port 1 View  07/11/2012   *RADIOLOGY REPORT*  Clinical Data: Follow-up CHF  PORTABLE CHEST - 1 VIEW  Comparison: 07/11/2012  Findings: Cardiac enlargement with mild vascular congestion. Negative for edema.  Mild bibasilar atelectasis unchanged.  No significant effusion.  IMPRESSION: Cardiac enlargement with mild vascular congestion.  Mild bibasilar atelectasis.  No significant change from prior study today.   Original Report Authenticated By: Janeece Riggers, M.D.   Dg Chest Port 1 View  07/11/2012   *RADIOLOGY REPORT*  Clinical Data: Hypoxia  PORTABLE CHEST - 1 VIEW  Comparison: 07/10/2012, 03/12/2012, and 12/09/2011  Findings: Stable cardiomegaly.  Pulmonary vascular congestion. Low lung volumes.  Hazy focal opacity the right lung base noted. Peripheral opacity left mid lung appears similar to prior chest radiographs and could reflect pleuroparenchymal scarring or pleural thickening.  IMPRESSION:  1.  Focal opacity at the right lung base.  This could reflect airspace  disease, atelectasis, or scarring. 2.  Cardiomegaly with pulmonary vascular congestion.   Original Report Authenticated By: Britta Mccreedy, M.D.   Dg Chest Port 1 View  07/10/2012   *RADIOLOGY REPORT*  Clinical Data: Preoperative chest x-ray  PORTABLE CHEST - 1 VIEW  Comparison: Prior chest x-ray 03/13/2012  Findings: Stable cardiomegaly.  No pulmonary edema, there is been  reduced pulmonary vascular congestion compared to the prior study. No pneumothorax or pleural effusion.  Nonspecific patchy opacity in the periphery of the right lung.  No acute osseous abnormality.  IMPRESSION:  1.  Nonspecific patchy opacity in the right mid lung may reflect atelectasis, underlying parenchymal scarring or in the appropriate clinical setting, and early infiltrate.  Recommend dedicated PA and lateral chest x-ray to further evaluate.  2.  Stable cardiomegaly without evidence of edema.   Original Report Authenticated By: Malachy Moan, M.D.   Dg C-arm 61-120 Min  07/10/2012   *RADIOLOGY REPORT*  Clinical Data: Open reduction and internal fixation of the left femur.  DG C-ARM 61-120 MIN,LEFT FEMUR - 2 VIEW  Comparison:  07/09/2012  Technique:  C-arm fluoroscopic images were obtained intraoperatively and submitted for postoperative interpretation. Please see the performing provider's procedural report for the fluoroscopy time utilized.  Findings: Intraoperative images were submitted.  Again noted is a left hip arthroplasty.  A lateral plate has been placed along the femur.  There are distal interlocking screws in the comminuted distal femur fracture. Knee appears to be located.  IMPRESSION: Plate and screw fixation of the comminuted distal femur fracture.   Original Report Authenticated By: Richarda Overlie, M.D.    Microbiology: Recent Results (from the past 240 hour(s))  SURGICAL PCR SCREEN     Status: None   Collection Time    07/10/12  2:42 AM      Result Value Range Status   MRSA, PCR NEGATIVE  NEGATIVE Final    Staphylococcus aureus NEGATIVE  NEGATIVE Final   Comment:            The Xpert SA Assay (FDA     approved for NASAL specimens     in patients over 8 years of age),     is one component of     a comprehensive surveillance     program.  Test performance has     been validated by [employer] for patients greater     than or equal to 24 year old.     It is not intended     to diagnose infection nor to     guide or monitor treatment.  CULTURE, BLOOD (ROUTINE X 2)     Status: None   Collection Time    07/12/12  9:15 AM      Result Value Range Status   Specimen Description BLOOD LEFT ARM   Final   Special Requests     Final   Value: BOTTLES DRAWN AEROBIC AND ANAEROBIC AERO 10CC ANA 7CC   Culture  Setup Time 07/12/2012 14:27   Final   Culture     Final   Value:        BLOOD CULTURE RECEIVED NO GROWTH TO DATE CULTURE WILL BE HELD FOR 5 DAYS BEFORE ISSUING A FINAL NEGATIVE REPORT   Report Status PENDING   Incomplete  CULTURE, BLOOD (ROUTINE X 2)     Status: None   Collection Time    07/12/12  9:20 AM      Result Value Range Status   Specimen Description BLOOD LEFT HAND   Final   Special Requests BOTTLES DRAWN AEROBIC AND ANAEROBIC 10CC   Final   Culture  Setup Time 07/12/2012 14:27   Final   Culture     Final   Value:        BLOOD CULTURE RECEIVED NO GROWTH TO DATE CULTURE WILL BE HELD FOR  5 DAYS BEFORE ISSUING A FINAL NEGATIVE REPORT   Report Status PENDING   Incomplete     Labs: Basic Metabolic Panel:  Recent Labs Lab 07/11/12 0525 07/12/12 0440 07/13/12 0450 07/14/12 0350 07/15/12 0540  NA 135 133* 134* 135 133*  K 3.3* 3.8 4.1 3.8 3.5  CL 99 97 99 98 96  CO2 25 23 23 25 25   GLUCOSE 105* 135* 101* 123* 107*  BUN 79* 82* 84* 88* 82*  CREATININE 3.83* 4.26* 4.06* 3.84* 3.60*  CALCIUM 8.0* 8.2* 8.4 8.6 8.5  PHOS  --   --  6.4* 5.7* 4.8*   Liver Function Tests:  Recent Labs Lab 07/11/12 0525 07/13/12 0450 07/14/12 0350 07/15/12 0540  AST 21  --   --   --    ALT 7  --   --   --   ALKPHOS 48  --   --   --   BILITOT 0.3  --   --   --   PROT 5.7*  --   --   --   ALBUMIN 2.6* 2.2* 2.4* 2.2*   No results found for this basename: LIPASE, AMYLASE,  in the last 168 hours No results found for this basename: AMMONIA,  in the last 168 hours CBC:  Recent Labs Lab 07/09/12 2310  07/10/12 1550 07/11/12 0525 07/13/12 0450 07/14/12 0350 07/15/12 0540  WBC 12.6*  < > 13.7* 18.2* 15.4* 13.2* 13.4*  NEUTROABS 10.6*  --   --  13.6*  --   --   --   HGB 11.4*  < > 9.4* 8.3* 7.0* 7.7* 7.5*  HCT 34.8*  < > 29.0* 26.3* 21.2* 23.3* 23.6*  MCV 88.1  < > 89.0 89.8 87.6 87.3 89.1  PLT 140*  < > 114* 113* 96* 113* 130*  < > = values in this interval not displayed. Cardiac Enzymes: No results found for this basename: CKTOTAL, CKMB, CKMBINDEX, TROPONINI,  in the last 168 hours BNP: BNP (last 3 results)  Recent Labs  12/30/11 1057 03/12/12 0449 03/15/12 0440  PROBNP 534.0* 14215.0* 30340.0*   CBG:  Recent Labs Lab 07/14/12 1543 07/14/12 2043 07/15/12 0033 07/15/12 0406 07/15/12 0732  GLUCAP 121* 147* 117* 109* 99       Signed:  HERNANDEZ ACOSTA,ESTELA  [name] Pager: [phone] 07/15/2012, 2:42 PM

## 2012-07-15 NOTE — Progress Notes (Signed)
Pt discharged to [facility] for rehabilitation.

## 2012-07-15 NOTE — Progress Notes (Signed)
Patient is in agreement to CIR admit and bed is available today. I will arrange. [phone]

## 2012-07-15 NOTE — Progress Notes (Signed)
Subjective:  Now appears will be able to go to inpatient rehab!  Objective Vital signs in last 24 hours: Filed Vitals:   07/14/12 0552 07/14/12 1319 07/14/12 2039 07/15/12 0647  BP: 170/97 182/92 128/75 130/72  Pulse: 80 80 86 85  Temp: 99.4 F (37.4 C) 98.7 F (37.1 C) 99.2 F (37.3 C) 98.9 F (37.2 C)  TempSrc: Oral Oral    Resp: 16 18 18 18   Height:      Weight:      SpO2: 96% 95% 99% 99%   Weight change:   Intake/Output Summary (Last 24 hours) at 07/15/12 1136 Last data filed at 07/15/12 0820  Gross per 24 hour  Intake    480 ml  Output    675 ml  Net   -195 ml   Physical Exam: BP 130/72  Pulse 85  Temp(Src) 98.9 F (37.2 C) (Oral)  Resp 18  Ht 6\' 1"  (1.854 m)  Wt 122.471 kg (270 lb)  BMI 35.63 kg/m2  SpO2 99% NAD Lungs  clear anteriorly S1S2 no S3 Irreg Abdomen large no focal tenderness  Right renal allograft non tender Left leg in brace Right leg no sig edema/wearing TED stocking  Labs: Basic Metabolic Panel:  Recent Labs Lab 07/09/12 2310 07/10/12 0540 07/10/12 1550 07/11/12 0525 07/12/12 0440 07/13/12 0450 07/14/12 0350 07/15/12 0540  NA 135 137  --  135 133* 134* 135 133*  K 3.6 3.4*  --  3.3* 3.8 4.1 3.8 3.5  CL 97 99  --  99 97 99 98 96  CO2 26 24  --  25 23 23 25 25   GLUCOSE 170* 119*  --  105* 135* 101* 123* 107*  BUN 92* 92*  --  79* 82* 84* 88* 82*  CREATININE 4.17* 4.04* 4.15* 3.83* 4.26* 4.06* 3.84* 3.60*  CALCIUM 8.7 8.7  --  8.0* 8.2* 8.4 8.6 8.5  PHOS  --   --   --   --   --  6.4* 5.7* 4.8*   Liver Function Tests:  Recent Labs Lab 07/11/12 0525 07/13/12 0450 07/14/12 0350 07/15/12 0540  AST 21  --   --   --   ALT 7  --   --   --   ALKPHOS 48  --   --   --   BILITOT 0.3  --   --   --   PROT 5.7*  --   --   --   ALBUMIN 2.6* 2.2* 2.4* 2.2*    Recent Labs Lab 07/09/12 2310  07/11/12 0525 07/13/12 0450 07/14/12 0350 07/15/12 0540  WBC 12.6*  < > 18.2* 15.4* 13.2* 13.4*  NEUTROABS 10.6*  --  13.6*  --   --   --    HGB 11.4*  < > 8.3* 7.0* 7.7* 7.5*  HCT 34.8*  < > 26.3* 21.2* 23.3* 23.6*  MCV 88.1  < > 89.8 87.6 87.3 89.1  PLT 140*  < > 113* 96* 113* 130*  < > = values in this interval not displayed.  Recent Labs Lab 07/14/12 1543 07/14/12 2043 07/15/12 0033 07/15/12 0406 07/15/12 0732  GLUCAP 121* 147* 117* 109* 99    Recent Labs Lab 07/10/12 0540  IRON 39*  TIBC 235  FERRITIN 121   Studies/Results: No results found. Medications: . sodium chloride 20 mL/hr at 07/14/12 0645   . allopurinol  100 mg Oral BID  . amiodarone  200 mg Oral Daily  . aspirin EC  81 mg Oral  Daily  . atorvastatin  20 mg Oral q1800  . carvedilol  25 mg Oral BID WC  . ceFEPime (MAXIPIME) IV  1 g Intravenous Q24H  . darbepoetin (ARANESP) injection - NON-DIALYSIS  100 mcg Subcutaneous Q Mon-1800  . docusate sodium  100 mg Oral BID  . enoxaparin (LOVENOX) injection  30 mg Subcutaneous Q24H  . furosemide  80 mg Oral BID  . hydrALAZINE  25 mg Oral TID  . insulin aspart  0-9 Units Subcutaneous TID AC & HS  . isosorbide mononitrate  60 mg Oral Daily  . potassium chloride  10 mEq Oral Daily  . predniSONE  10 mg Oral Q breakfast  . senna  2 tablet Oral BID  . sirolimus  1.2 mg Oral Daily  . vancomycin  1,500 mg Intravenous Q48H    I  have reviewed scheduled and prn medications.  ASSESSMENT/RECOMMENDATIONS: 57 yo AA male with history of hypertension, hepatitis C, chronic kidney disease with renal transplant 1996 with baseline creatinine around 3.5 (3.42 on 06/30/12)  and left hip replacement, admitted 07/10/2012 after fall landing on his left knee. X-rays and imaging revealed left intra-articular distal femur fracture. Underwent ORIF of left distal femur fracture 07/10/2012 by Dr. [name].  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 BP stable  ABLA with Hb down to 8.3 post op, further reduced to 7 (6/17) Iron low Dosed Feraheme X 1 6/15 Started Darbe 100/week (6/16) Transfused 1 unit  (6/17) with lasix 80 pre  Cardiomyopathy EF 30-35  AFib  On amio  HepC +   Camille Bal, MD [facility] [phone] pager 07/15/2012, 11:36 AM

## 2012-07-15 NOTE — H&P (Signed)
Chief Complaint   Patient presents with   .  Knee Pain   :  HPI: Chad [name] is a 57 y.o. right-handed male with history of hypertension, atrial fibrillation maintained on amiodarone, hepatitis C, chronic kidney disease with renal transplant 1996 with baseline creatinine around 3.00 and left hip replacement. Patient well known to inpatient rehabilitation services from admission 06/27/2011 for critical illness myopathy which demanded tracheostomy patient was decannulated discharge to home doing well. Patient independent prior to admission living alone. Admitted 07/10/2012 after fall landing on his left knee. X-rays and imaging revealed left intra-articular distal femur fracture. Underwent ORIF of left distal femur fracture 07/10/2012 per Dr. [name]. Advised nonweightbearing left lower extremity with knee immobilizer restrictions. Maintained on subcutaneous Lovenox for DVT prophylaxis. Postoperative anemia 8.3 and has been transfused. Renal function remains stable with latest creatinine 3.60 followed by renal services advised to continue prednisone as well as sirolimus. Physical and occupational therapy evaluations completed 07/11/2012 with recommendations of physical medicine rehabilitation consult to consider inpatient rehabilitation services. Patient was felt to be a good candidate for inpatient rehabilitation services and was admitted for comprehensive rehabilitation program  Review of Systems  Cardiovascular: Positive for palpitations and leg swelling.  Musculoskeletal: Positive for myalgias.  All other systems reviewed and are negative  Past Medical History   Diagnosis  Date   .  Hypertension    .  Hepatitis C  1996   .  CKD (chronic kidney disease), stage IV      a. s/p renal transplant 1996.   Marland Kitchen  Pneumonia    .  HLD (hyperlipidemia)    .  Nonischemic cardiomyopathy      a. unknown etiology, EF 30-35% by echo 09/2011; b. 09/2011 Lexi MV EF 42%, no ischemia/infarct.   .  Systolic CHF     .  Atrial flutter      a. in the setting of sepsis 05/2011; on amio; no anticoagulation    Past Surgical History   Procedure  Laterality  Date   .  Nephrectomy transplanted organ     .  Insertion of dialysis catheter   06/20/2011     Procedure: INSERTION OF DIALYSIS CATHETER; Surgeon: [name], MD; Location: [facility] OR; Service: Vascular; Laterality: Right; Ultrasound guided insertion of right internal jugular dialysis catheter   .  Multiple extractions with alveoloplasty   06/27/2011     Procedure: MULTIPLE EXTRACION WITH ALVEOLOPLASTY; Surgeon: [name], DDS; Location: [facility] OR; Service: Oral Surgery; Laterality: N/A; Extraction of tooth # 14 with alveoloplasty   .  Hip surgery     .  Joint replacement       left hip   .  Femur fracture surgery  Left  07/09/2012     Dr Deno Etienne   .  Femur im nail  Left  07/10/2012     Procedure: IM FEMORAL NAIL; Surgeon: Toni Arthurs, MD; Location: [facility] OR; Service: Orthopedics; Laterality: Left;    Family History   Problem  Relation  Age of Onset   .  Heart disease  Neg Hx     Social History: reports that he has never smoked. He has never used smokeless tobacco. He reports that he does not drink alcohol or use illicit drugs.  Allergies: No Known Allergies  Medications Prior to Admission   Medication  Sig  Dispense  Refill   .  allopurinol (ZYLOPRIM) 100 MG tablet  Take 200 mg by mouth 2 (two) times daily.     Marland Kitchen  amiodarone (PACERONE) 200 MG tablet  Take 200 mg by mouth daily.     Marland Kitchen  aspirin EC 81 MG tablet  Take 81 mg by mouth daily.     .  carvedilol (COREG) 25 MG tablet  Take 25 mg by mouth 2 (two) times daily with a meal.     .  furosemide (LASIX) 80 MG tablet  Take 1 tablet (80 mg total) by mouth 2 (two) times daily.  60 tablet  2   .  hydrALAZINE (APRESOLINE) 50 MG tablet  Take 75 mg by mouth 3 (three) times daily. Takes 1.5 tablets     .  HYDROcodone-acetaminophen (NORCO/VICODIN) 5-325 MG per tablet  Take 1-2 tablets by mouth every 4 (four)  hours as needed for pain.     .  isosorbide mononitrate (IMDUR) 60 MG 24 hr tablet  Take 1 tablet (60 mg total) by mouth daily.  30 tablet  2   .  omeprazole (PRILOSEC) 20 MG capsule  Take 20 mg by mouth daily.     .  potassium chloride (K-DUR,KLOR-CON) 10 MEQ tablet  Take 10 mEq by mouth daily.     .  pravastatin (PRAVACHOL) 40 MG tablet  Take 40 mg by mouth daily.     .  predniSONE (DELTASONE) 10 MG tablet  Take 10 mg by mouth daily.     .  sirolimus (RAPAMUNE) 1 MG/ML solution  Take 1.2 mg by mouth daily.      Home:  Home Living  Lives With: Alone  Available Help at Discharge: Friend(s);Available PRN/intermittently  Type of Home: House  Home Access: Stairs to enter  [employer] of Steps: 8  Entrance Stairs-Rails: Left  Home Layout: Two level;Able to live on main level with bedroom/bathroom  Bathroom Shower/Tub: Pension scheme manager: Handicapped height  Home Adaptive Equipment: Bedside commode/3-in-1;Straight cane;Walker - rolling  Functional History:  Prior Function  Able to Take Stairs?: Yes  Driving: Yes  Functional Status:  Mobility:  Bed Mobility  Bed Mobility: Sit to Supine  Supine to Sit: 1: +2 Total assist;HOB flat;With rails  Supine to Sit: Patient Percentage: 50%  Sitting - Scoot to Edge of Bed: 3: Mod assist (heavy mod)  Sitting - Scoot to Edge of Bed: Patient Percentage: 30%  Sit to Supine: 3: Mod assist  Sit to Supine: Patient Percentage: 30%  Scooting to HOB: 5: Supervision;With rail  Transfers  Transfers: Sit to Stand;Stand Pivot Transfers;Stand to Sit  Sit to Stand: 1: +2 Total assist;From chair/3-in-1;With upper extremity assist;With armrests  Sit to Stand: Patient Percentage: 20%  Stand to Sit: 1: +2 Total assist;To bed;With upper extremity assist;With armrests  Stand to Sit: Patient Percentage: 20%  Stand Pivot Transfers: 1: +2 Total assist;With armrests  Stand Pivot Transfers: Patient Percentage: 20%  Ambulation/Gait   Ambulation/Gait Assistance: Not tested (comment) (unsafe to attempt)  Stairs: No  Wheelchair Mobility  Wheelchair Mobility: No  ADL:  ADL  Eating/Feeding: Performed;Independent  Where Assessed - Eating/Feeding: Bed level  Grooming: Wash/dry face;Set up  Where Assessed - Grooming: Supported sitting  Upper Body Bathing: Simulated;Supervision/safety  Where Assessed - Upper Body Bathing: Unsupported sitting  Lower Body Bathing: Simulated;Maximal assistance  Where Assessed - Lower Body Bathing: Unsupported sitting  Upper Body Dressing: Simulated;Set up  Where Assessed - Upper Body Dressing: Unsupported sitting  Lower Body Dressing: Performed;+1 Total assistance  Where Assessed - Lower Body Dressing: Unsupported sitting  Toilet Transfer: +2 Total assistance  Toilet Transfer Method: Sit to  stand;Stand pivot  Acupuncturist: Other (comment) (from recliner chair to bed)  Equipment Used: Gait belt;Other (comment);Rolling walker (bledsoe brace, O2)  Transfers/Ambulation Related to ADLs: Pt requiring 4 people to assist with stand pivot transfer from chair to bed.  ADL Comments: Pt performed stand pivot transfer from recliner chair to bed. OT discussed dressing technique and use of a reacher. Pt needs to practice with AE before going home. OT also educated on using sliding board to transfer to drop arm commode. Will also need to practice.  Cognition:  Cognition  Overall Cognitive Status: Within Functional Limits for tasks assessed  Arousal/Alertness: Awake/alert  Orientation Level: Oriented X4  Cognition  Arousal/Alertness: Awake/alert  Behavior During Therapy: WFL for tasks assessed/performed  Overall Cognitive Status: Within Functional Limits for tasks assessed  Physical Exam:  Blood pressure 130/72, pulse 85, temperature 98.9 F (37.2 C), temperature source Oral, resp. rate 18, height 6\' 1"  (1.854 m), weight 122.471 kg (270 lb), SpO2 99.00%.  Physical Exam  Vitals reviewed.   Constitutional: He is oriented to person, place, and time.  HENT:  Head: Normocephalic.  Eyes: EOM are normal.  Neck: Neck supple. No thyromegaly present.  Cardiovascular:  Cardiac rate controlled  Pulmonary/Chest:  Decreased breath sounds but clear to auscultation  Abdominal: Soft. Bowel sounds are normal. He exhibits no distension.  Neurological: He is alert and oriented to person, place, and time.  Patient follows full commands. Motor function within normal limits in UE's and RLE. Sensory function nl  Skin:  Surgical site is dressed. Left leg in KI/ appropriately tender  Psychiatric: He has a normal mood and affect. His behavior is normal. Judgment and thought content normal  Neuro:  Eyes without evidence of nystagmus  Tone is normal without evidence of spasticity Cerebellar exam shows no evidence of ataxia on finger nose finger or heel to shin testing No evidence of trunkal ataxia  Motor strength is 5/5 in bilateral deltoid, biceps, triceps, finger flexors and extensors, wrist flexors and extensors, hip flexors, knee flexors and extensors, ankle dorsiflexors, plantar flexors, invertors and evertors, toe flexors and extensors  Sensory exam is normal to pinprick, proprioception and light touch in the upper and lower limbs    Results for orders placed during the hospital encounter of 07/09/12 (from the past 48 hour(s))   PREPARE RBC (CROSSMATCH) Status: None    Collection Time    07/13/12 10:12 AM   Result  Value  Range    Order Confirmation  ORDER PROCESSED BY BLOOD BANK    GLUCOSE, CAPILLARY Status: Abnormal    Collection Time    07/13/12 11:35 AM   Result  Value  Range    Glucose-Capillary  126 (*)  70 - 99 mg/dL   GLUCOSE, CAPILLARY Status: Abnormal    Collection Time    07/13/12 4:26 PM   Result  Value  Range    Glucose-Capillary  142 (*)  70 - 99 mg/dL   GLUCOSE, CAPILLARY Status: Abnormal    Collection Time    07/13/12 8:08 PM   Result  Value  Range     Glucose-Capillary  167 (*)  70 - 99 mg/dL   GLUCOSE, CAPILLARY Status: Abnormal    Collection Time    07/14/12 12:02 AM   Result  Value  Range    Glucose-Capillary  145 (*)  70 - 99 mg/dL   CBC Status: Abnormal    Collection Time    07/14/12 3:50 AM   Result  Value  Range    WBC  13.2 (*)  4.0 - 10.5 K/uL    RBC  2.67 (*)  4.22 - 5.81 MIL/uL    Hemoglobin  7.7 (*)  13.0 - 17.0 g/dL    HCT  16.1 (*)  09.6 - 52.0 %    MCV  87.3  78.0 - 100.0 fL    MCH  28.8  26.0 - 34.0 pg    MCHC  33.0  30.0 - 36.0 g/dL    RDW  04.5 (*)  40.9 - 15.5 %    Platelets  113 (*)  150 - 400 K/uL    Comment:  CONSISTENT WITH PREVIOUS RESULT   RENAL FUNCTION PANEL Status: Abnormal    Collection Time    07/14/12 3:50 AM   Result  Value  Range    Sodium  135  135 - 145 mEq/L    Potassium  3.8  3.5 - 5.1 mEq/L    Chloride  98  96 - 112 mEq/L    CO2  25  19 - 32 mEq/L    Glucose, Bld  123 (*)  70 - 99 mg/dL    BUN  88 (*)  6 - 23 mg/dL    Creatinine, Ser  8.11 (*)  0.50 - 1.35 mg/dL    Calcium  8.6  8.4 - 10.5 mg/dL    Phosphorus  5.7 (*)  2.3 - 4.6 mg/dL    Albumin  2.4 (*)  3.5 - 5.2 g/dL    GFR calc non Af Amer  16 (*)  >90 mL/min    GFR calc Af Amer  19 (*)  >90 mL/min    Comment:      The eGFR has been calculated     using the CKD EPI equation.     This calculation has not been     validated in all clinical     situations.     eGFR's persistently     <90 mL/min signify     possible Chronic Kidney Disease.   GLUCOSE, CAPILLARY Status: Abnormal    Collection Time    07/14/12 3:53 AM   Result  Value  Range    Glucose-Capillary  138 (*)  70 - 99 mg/dL   GLUCOSE, CAPILLARY Status: Abnormal    Collection Time    07/14/12 6:41 AM   Result  Value  Range    Glucose-Capillary  121 (*)  70 - 99 mg/dL   GLUCOSE, CAPILLARY Status: Abnormal    Collection Time    07/14/12 11:30 AM   Result  Value  Range    Glucose-Capillary  124 (*)  70 - 99 mg/dL   GLUCOSE, CAPILLARY Status: Abnormal     Collection Time    07/14/12 3:43 PM   Result  Value  Range    Glucose-Capillary  121 (*)  70 - 99 mg/dL    Comment 1  Notify RN    GLUCOSE, CAPILLARY Status: Abnormal    Collection Time    07/14/12 8:43 PM   Result  Value  Range    Glucose-Capillary  147 (*)  70 - 99 mg/dL   GLUCOSE, CAPILLARY Status: Abnormal    Collection Time    07/15/12 12:33 AM   Result  Value  Range    Glucose-Capillary  117 (*)  70 - 99 mg/dL   GLUCOSE, CAPILLARY Status: Abnormal    Collection Time    07/15/12 4:06 AM   Result  Value  Range  Glucose-Capillary  109 (*)  70 - 99 mg/dL   RENAL FUNCTION PANEL Status: Abnormal    Collection Time    07/15/12 5:40 AM   Result  Value  Range    Sodium  133 (*)  135 - 145 mEq/L    Potassium  3.5  3.5 - 5.1 mEq/L    Chloride  96  96 - 112 mEq/L    CO2  25  19 - 32 mEq/L    Glucose, Bld  107 (*)  70 - 99 mg/dL    BUN  82 (*)  6 - 23 mg/dL    Creatinine, Ser  4.09 (*)  0.50 - 1.35 mg/dL    Calcium  8.5  8.4 - 10.5 mg/dL    Phosphorus  4.8 (*)  2.3 - 4.6 mg/dL    Albumin  2.2 (*)  3.5 - 5.2 g/dL    GFR calc non Af Amer  18 (*)  >90 mL/min    GFR calc Af Amer  20 (*)  >90 mL/min    Comment:      The eGFR has been calculated     using the CKD EPI equation.     This calculation has not been     validated in all clinical     situations.     eGFR's persistently     <90 mL/min signify     possible Chronic Kidney Disease.   CBC Status: Abnormal    Collection Time    07/15/12 5:40 AM   Result  Value  Range    WBC  13.4 (*)  4.0 - 10.5 K/uL    RBC  2.65 (*)  4.22 - 5.81 MIL/uL    Hemoglobin  7.5 (*)  13.0 - 17.0 g/dL    HCT  81.1 (*)  91.4 - 52.0 %    MCV  89.1  78.0 - 100.0 fL    MCH  28.3  26.0 - 34.0 pg    MCHC  31.8  30.0 - 36.0 g/dL    RDW  78.2 (*)  95.6 - 15.5 %    Platelets  130 (*)  150 - 400 K/uL    No results found.  Post Admission Physician Evaluation:  1. Functional deficits secondary to Left distal femuur fracture. 2. Patient is admitted  to receive collaborative, interdisciplinary care between the physiatrist, rehab nursing staff, and therapy team. 3. Patient's level of medical complexity and substantial therapy needs in context of that medical necessity cannot be provided at a lesser intensity of care such as a SNF. 4. Patient has experienced substantial functional loss from his/her baseline which was documented above under the "Functional History" and "Functional Status" headings. Judging by the patient's diagnosis, physical exam, and functional history, the patient has potential for functional progress which will result in measurable gains while on inpatient rehab. These gains will be of substantial and practical use upon discharge in facilitating mobility and self-care at the household level. 5. Physiatrist will provide 24 hour management of medical needs as well as oversight of the therapy plan/treatment and provide guidance as appropriate regarding the interaction of the two. 6. 24 hour rehab nursing will assist with bladder management, bowel management, safety, skin/wound care, disease management, medication administration and pain management and help integrate therapy concepts, techniques,education, etc. 7. PT will assess and treat for/with:  pre gait training, gait training, NM re ed, endurance , safety, and  Equipment.. Goals are: Sup/mod I mobility. 8. OT will assess  and treat for/with: ADLs,, neuromuscular re-education, safety, endurance and equipment.. Goals are: Mod I ADL WC level. 9. SLP will assess and treat for/with: NA. Goals are: NA. 10. Case Management and Social Worker will assess and treat for psychological issues and discharge planning. 11. Team conference will be held weekly to assess progress toward goals and to determine barriers to discharge. 12. Patient will receive at least 3 hours of therapy per day at least 5 days per week. 13. ELOS: 2 wks Prognosis: good Medical Problem List and Plan:  1. Left distal  femur fracture after fall. Status post ORIF 07/10/2012  2. DVT Prophylaxis/Anticoagulation: Subcutaneous Lovenox. Monitor platelet counts and any signs of bleeding. Check vascular study.  3. Pain Management: Oxycodone as needed. Monitor with increased mobility  4. Neuropsych: This patient is capable of making decisions on his/her own behalf.  5. Acute blood loss anemia. Latest hemoglobin 7.5. Followup CBC and transfuse as appropriate. Continue Aranesp as advised  6. Chronic kidney disease and renal transplant 1996. Baseline creatinine 3.00. Followup per renal services. Continue prednisone/Sirolimus as directed  7. Hypertension/atrial fibrillation. Amiodarone 200 mg daily, coreg 25 mg twice a day, Lasix 80 mg twice a day, hydralazine 25 mg 3 times a day, M.D. or 60 mg daily. Monitor with increased mobility. Patient no chest pain or shortness of breath  8. Nonischemic cardiomyopathy. Continue aspirin therapy.  9. Systolic congestive heart failure. Continue Lasix as advised. Monitor for any signs of fluid overload  10. Non-insulin-dependent diabetes mellitus. Hemoglobin A1c 5.7. Blood sugars appear to be driven by prednisone. Continue sliding scale check blood sugars a.c. and at bedtime  11. History of gout. Continue allopurinol. Monitor for any gout flareups.  12. Hyperlipidemia. Lipitor  13. Obstructive sleep apnea. CPAP  07/15/2012

## 2012-07-15 NOTE — Progress Notes (Signed)
Pt. Refused cpap for tonight. 

## 2012-07-15 NOTE — PMR Pre-admission (Signed)
PMR Admission Coordinator Pre-Admission Assessment  Patient: Chad [name] is an 57 y.o., male MRN: [id number] DOB: 24-Feb-1955 Height: 6\' 1"  (185.4 cm) Weight: 122.471 kg (270 lb) (per patient)              Insurance Information HMO:     PPO:      PCP:      IPA:    80/20:      OTHER:  PRIMARY: self pay      Policy#:       Subscriber:  Has not worked since 05/2011 for [employer]. Patient with a history of renal transplant 1996. Had Medicare a and b but returned to work so both termed. Medicare a 04/27/93 and termed 2/29/07. Medicare B 04/27/93 and termed 09/27/1998. Patient has applied for disability 1 year ago and says decision is pending. I have recommended he get a diability lawyer to assist.  Medicaid Application Date:       Case Manager: not eligible for medicaid per financial counselor due to his assessts which include a 401 K  Disability Application Date:       Case Worker: one year ago and decision is pending  Emergency Contact Information Contact Information   Name Relation Home Work Mobile   Jones,Pat Significant other [phone]  [phone]   [place], [place] [phone]  [phone]     Current Medical History  Patient Admitting Diagnosis: left distal femur fx after fall  History of Present Illness: Chad [name] is a 57 y.o. right-handed male with history of hypertension, hepatitis C, chronic kidney disease with renal transplant 1996 with baseline creatinine around 3.00 and left hip replacement. Patient independent prior to admission living alone. Admitted 07/10/2012 after fall landing on his left knee. X-rays and imaging revealed left intra-articular distal femur fracture. Underwent ORIF of left distal femur fracture 07/10/2012 per Dr. [name]. Advised nonweightbearing left lower extremity with Bledsoe brace restrictions. Maintained on subcutaneous Lovenox for DVT prophylaxis. Postoperative anemia 8.3 and monitored. Has been transfused 1 unit PRBCs.  On empiric abx for  questionable HCAP in the setting of immunocompromised state.  -Afebrile with WBCs trending down.  -Would continue on empiric abx for 7 days ( can transition to PO at time of DC).      Past Medical History  Past Medical History  Diagnosis Date  . Hypertension   . Hepatitis C 1996  . CKD (chronic kidney disease), stage IV     a. s/p renal transplant 1996.  Marland Kitchen Pneumonia   . HLD (hyperlipidemia)   . Nonischemic cardiomyopathy     a. unknown etiology, EF 30-35% by echo 09/2011;  b. 09/2011 Lexi MV EF 42%, no ischemia/infarct.  . Systolic CHF   . Atrial flutter     a. in the setting of sepsis 05/2011; on amio; no anticoagulation    Family History  family history is negative for Heart disease.  Prior Rehab/Hospitalizations: CIR 05/2011 with VDRF and PNA and trach  Current Medications  Current facility-administered medications:0.9 %  sodium chloride infusion, , Intravenous, Continuous, Toni Arthurs, MD, Last Rate: 20 mL/hr at 07/14/12 0645;  acetaminophen (TYLENOL) suppository 650 mg, 650 mg, Rectal, Q6H PRN, Ron Parker, MD;  acetaminophen (TYLENOL) tablet 650 mg, 650 mg, Oral, Q6H PRN, Ron Parker, MD, 650 mg at 07/13/12 1205 allopurinol (ZYLOPRIM) tablet 100 mg, 100 mg, Oral, BID, Garnetta Buddy, MD, 100 mg at 07/15/12 0946;  amiodarone (PACERONE) tablet 200 mg, 200 mg, Oral, Daily, Jerald Kief, MD, 200 mg  at 07/15/12 0946;  aspirin EC tablet 81 mg, 81 mg, Oral, Daily, Jerald Kief, MD, 81 mg at 07/15/12 0946;  atorvastatin (LIPITOR) tablet 20 mg, 20 mg, Oral, q1800, Jerald Kief, MD, 20 mg at 07/14/12 1700 carvedilol (COREG) tablet 25 mg, 25 mg, Oral, BID WC, Jerald Kief, MD, 25 mg at 07/15/12 0818;  ceFEPIme (MAXIPIME) 1 g in dextrose 5 % 50 mL IVPB, 1 g, Intravenous, Q24H, Crystal Stillinger Robertson, RPH, 1 g at 07/14/12 1133;  darbepoetin (ARANESP) injection 100 mcg, 100 mcg, Subcutaneous, Q Mon-1800, Sadie Haber, MD, 100 mcg at 07/12/12 1747 docusate sodium  (COLACE) capsule 100 mg, 100 mg, Oral, BID, Toni Arthurs, MD, 100 mg at 07/15/12 0946;  enoxaparin (LOVENOX) injection 30 mg, 30 mg, Subcutaneous, Q24H, Toni Arthurs, MD, 30 mg at 07/14/12 1618;  furosemide (LASIX) tablet 80 mg, 80 mg, Oral, BID, Jerald Kief, MD, 80 mg at 07/15/12 0946;  hydrALAZINE (APRESOLINE) injection 10 mg, 10 mg, Intravenous, Q6H PRN, Ron Parker, MD, 10 mg at 07/14/12 1618 hydrALAZINE (APRESOLINE) tablet 25 mg, 25 mg, Oral, TID, Sadie Haber, MD, 25 mg at 07/15/12 0946;  HYDROmorphone (DILAUDID) injection 0.5-1 mg, 0.5-1 mg, Intravenous, Q2H PRN, Toni Arthurs, MD, 1 mg at 07/11/12 1214;  insulin aspart (novoLOG) injection 0-9 Units, 0-9 Units, Subcutaneous, TID AC & HS, Toni Arthurs, MD, 1 Units at 07/14/12 2130 isosorbide mononitrate (IMDUR) 24 hr tablet 60 mg, 60 mg, Oral, Daily, Jerald Kief, MD, 60 mg at 07/15/12 0946;  ondansetron (ZOFRAN) injection 4 mg, 4 mg, Intravenous, Q6H PRN, Ron Parker, MD, 4 mg at 07/11/12 0536;  ondansetron (ZOFRAN) tablet 4 mg, 4 mg, Oral, Q6H PRN, Ron Parker, MD;  oxyCODONE (Oxy IR/ROXICODONE) immediate release tablet 5-10 mg, 5-10 mg, Oral, Q3H PRN, Toni Arthurs, MD, 10 mg at 07/15/12 0653 potassium chloride (K-DUR,KLOR-CON) CR tablet 10 mEq, 10 mEq, Oral, Daily, Jerald Kief, MD, 10 mEq at 07/15/12 0946;  predniSONE (DELTASONE) tablet 10 mg, 10 mg, Oral, Q breakfast, Jerald Kief, MD, 10 mg at 07/15/12 1610;  senna (SENOKOT) tablet 17.2 mg, 2 tablet, Oral, BID, Toni Arthurs, MD, 17.2 mg at 07/15/12 0946;  sirolimus (RAPAMUNE) 1 MG/ML solution 1.2 mg, 1.2 mg, Oral, Daily, Jerald Kief, MD, 1.2 mg at 07/15/12 0947 vancomycin (VANCOCIN) 1,500 mg in sodium chloride 0.9 % 500 mL IVPB, 1,500 mg, Intravenous, Q48H, Crystal Stillinger Robertson, RPH, 1,500 mg at 07/14/12 1213;  zolpidem (AMBIEN) tablet 5 mg, 5 mg, Oral, QHS PRN, Ron Parker, MD  Patients Current Diet: Carb Control  Precautions /  Restrictions Precautions Precautions: Fall Other Brace/Splint: Bledsoe brace  Restrictions Weight Bearing Restrictions: Yes LLE Weight Bearing: Touchdown weight bearing   Prior Activity Level  has not worked since 05/2011. 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 Receiving disability from [facility] Devices / Equipment Home Assistive Devices/Equipment: Dan Humphreys (specify type);Bedside commode/3-in-1 (rolling walker) Home Adaptive Equipment: Bedside commode/3-in-1;Straight cane;Walker - rolling  Prior Functional Level Prior Function Level of Independence: Independent Able to Take Stairs?: Yes Driving: Yes Vocation: Other (comment) Comments: has not worked since 5/13; was appliance salesman at [employer]  Current Functional Level Cognition  Arousal/Alertness: Awake/alert Overall Cognitive Status: Within Functional Limits for tasks assessed Orientation Level: Oriented X4    Extremity Assessment (includes Sensation/Coordination)  RUE ROM/Strength/Tone: WFL for tasks assessed  RLE ROM/Strength/Tone: Deficits;Due to pain RLE ROM/Strength/Tone Deficits: Ntoed some strength deficits, with inability to shift weight onto RLE for standing -- this  could be due to pain in LLE and anxiety over getting up    ADLs  Eating/Feeding: Performed;Independent Where Assessed - Eating/Feeding: Bed level Grooming: Wash/dry face;Set up Where Assessed - Grooming: Supported sitting Upper Body Bathing: Simulated;Supervision/safety Where Assessed - Upper Body Bathing: Unsupported sitting Lower Body Bathing: Simulated;Maximal assistance Where Assessed - Lower Body Bathing: Unsupported sitting Upper Body Dressing: Simulated;Set up Where Assessed - Upper Body Dressing: Unsupported sitting Lower Body Dressing: Performed;+1 Total assistance Where Assessed - Lower Body Dressing: Unsupported sitting Toilet Transfer: +2 Total assistance Toilet Transfer: Patient Percentage: 20% Toilet Transfer Method: Sit to  stand;Stand pivot Toilet Transfer Equipment: Other (comment) (from recliner chair to bed) Equipment Used: Gait belt;Other (comment);Rolling walker (bledsoe brace, O2) Transfers/Ambulation Related to ADLs: Pt requiring 4 people to assist with stand pivot transfer from chair to bed. ADL Comments: Pt performed stand pivot transfer from recliner chair to bed. OT discussed dressing technique and use of a reacher. Pt needs to practice with AE before going home. OT also educated on using sliding board to transfer to drop arm commode. Will also need to practice.    Mobility  Bed Mobility: Sit to Supine Supine to Sit: 1: +2 Total assist;HOB flat;With rails Supine to Sit: Patient Percentage: 50% Sitting - Scoot to Edge of Bed: 3: Mod assist (heavy mod) Sitting - Scoot to Edge of Bed: Patient Percentage: 30% Sit to Supine: 3: Mod assist Sit to Supine: Patient Percentage: 30% Scooting to HOB: 5: Supervision;With rail    Transfers  Transfers: Sit to Stand;Stand Pivot Transfers;Stand to Sit Sit to Stand: 1: +2 Total assist;From chair/3-in-1;With upper extremity assist;With armrests Sit to Stand: Patient Percentage: 20% Stand to Sit: 1: +2 Total assist;To bed;With upper extremity assist;With armrests Stand to Sit: Patient Percentage: 20% Stand Pivot Transfers: 1: +2 Total assist;With armrests Stand Pivot Transfers: Patient Percentage: 20%    Ambulation / Gait / Stairs / Wheelchair Mobility  Ambulation/Gait Ambulation/Gait Assistance: Not tested (comment) (unsafe to attempt) Stairs: No Wheelchair Mobility Wheelchair Mobility: No    Posture / Holiday representative Standing - Balance Support: Bilateral upper extremity supported;During functional activity Static Standing - Level of Assistance: 1: +2 Total assist    Special needs/care consideration Bowel mgmt:no BM since Friday Bladder mgmt:continent urinal    Previous Home Environment Living Arrangements: Alone Lives With:  Alone Available Help at Discharge: Other (Comment) (stepsister, Audery Amel, in [place] can stay a week and friends ) Type of Home: House Home Layout: Two level;Able to live on main level with bedroom/bathroom Home Access: Stairs to enter Entrance Stairs-Rails: Left Entrance Stairs-Number of Steps: 4 steps off of back deck Bathroom Shower/Tub: Health visitor: Handicapped height Bathroom Accessibility: Yes How Accessible: Accessible via wheelchair;Accessible via walker Home Care Services: No Additional Comments: has ramp in a building that he can have placed at back 4 steps off deck  Discharge Living Setting Plans for Discharge Living Setting: Patient's home;Alone Type of Home at Discharge: House Discharge Home Layout: Two level;Able to live on main level with bedroom/bathroom Discharge Home Access: Stairs to enter;Other (comment) (to have ramp placed on back 4 steps off deck) Entrance Stairs-Number of Steps: 4 steps off back deck; 8 steps otherwise Discharge Bathroom Shower/Tub: Walk-in shower Discharge Bathroom Toilet: Handicapped height Discharge Bathroom Accessibility: Yes How Accessible: Accessible via wheelchair;Accessible via walker Do you have any problems obtaining your medications?: No  Social/Family/Support Systems Patient Roles: Parent;Partner Linden Dolin is mother of his son and significant other at [place] )  Contact Information: Linden Dolin, friend Anticipated Caregiver: Audery Amel, stepsister in [employer] to stay a week; otherwise he is arranging friends to help Anticipated Caregiver's Contact Information: [phone] Ability/Limitations of Caregiver: min assist to supervision Caregiver Availability: Other (Comment) (Can help for one week) Discharge Plan Discussed with Primary Caregiver: No (patient states he will discuss with his sister and friends) Is Caregiver In Agreement with Plan?: Yes Does Caregiver/Family have Issues with Lodging/Transportation while Pt is in Rehab?:  No    Goals/Additional Needs Patient/Family Goal for Rehab: supervision to min assist PT and OT w/c level Expected length of stay: ELOS 2 weeks Special Service Needs: Kidney Transpland since 1996 Pt/Family Agrees to Admission and willing to participate: Yes Program Orientation Provided & Reviewed with Pt/Caregiver Including Roles  & Responsibilities: Yes   Decrease burden of Care through IP rehab admission: Decrease number of caregivers to one caregiver for d/c home.  Possible need for SNF placement upon discharge:not anticipated. Will be w/c level.  Patient Condition: This patient's condition remains as documented in the consult dated 07/12/2012, in which the Rehabilitation Physician determined and documented that the patient's condition is appropriate for intensive rehabilitative care in an inpatient rehabilitation facility pending social supports to assist at prn level. These areas have been addressed. Sister and friends to provide intermittent assist at w/c level.  Will admit to inpatient rehab today.  Preadmission Screen Completed By:  Clois Dupes, 07/15/2012 10:08 AM ______________________________________________________________________   Discussed status with Dr. Wynn Banker on 07/15/12 at 1038 and received telephone approval for admission today.  Admission Coordinator:  Clois Dupes, time 0981 Date 07/15/2012.

## 2012-07-16 ENCOUNTER — Inpatient Hospital Stay (HOSPITAL_COMMUNITY): Payer: Medicaid Other

## 2012-07-16 ENCOUNTER — Inpatient Hospital Stay (HOSPITAL_COMMUNITY): Payer: Self-pay | Admitting: Occupational Therapy

## 2012-07-16 ENCOUNTER — Inpatient Hospital Stay (HOSPITAL_COMMUNITY): Payer: Medicaid Other | Admitting: Physical Therapy

## 2012-07-16 DIAGNOSIS — S7292XA Unspecified fracture of left femur, initial encounter for closed fracture: Secondary | ICD-10-CM

## 2012-07-16 DIAGNOSIS — S72453A Displaced supracondylar fracture without intracondylar extension of lower end of unspecified femur, initial encounter for closed fracture: Secondary | ICD-10-CM

## 2012-07-16 LAB — COMPREHENSIVE METABOLIC PANEL
ALT: 12 U/L (ref 0–53)
AST: 65 U/L — ABNORMAL HIGH (ref 0–37)
Alkaline Phosphatase: 53 U/L (ref 39–117)
CO2: 25 mEq/L (ref 19–32)
Calcium: 9 mg/dL (ref 8.4–10.5)
Potassium: 3.6 mEq/L (ref 3.5–5.1)
Sodium: 136 mEq/L (ref 135–145)
Total Protein: 6.4 g/dL (ref 6.0–8.3)

## 2012-07-16 LAB — CBC WITH DIFFERENTIAL/PLATELET
Basophils Absolute: 0.1 10*3/uL (ref 0.0–0.1)
Eosinophils Absolute: 0.2 10*3/uL (ref 0.0–0.7)
Eosinophils Relative: 1 % (ref 0–5)
Lymphocytes Relative: 12 % (ref 12–46)
Lymphs Abs: 1.9 10*3/uL (ref 0.7–4.0)
MCV: 89.4 fL (ref 78.0–100.0)
Neutrophils Relative %: 76 % (ref 43–77)
Platelets: 158 10*3/uL (ref 150–400)
RBC: 2.64 MIL/uL — ABNORMAL LOW (ref 4.22–5.81)
RDW: 17.4 % — ABNORMAL HIGH (ref 11.5–15.5)
WBC: 15.4 10*3/uL — ABNORMAL HIGH (ref 4.0–10.5)

## 2012-07-16 LAB — URINE MICROSCOPIC-ADD ON

## 2012-07-16 LAB — GLUCOSE, CAPILLARY
Glucose-Capillary: 107 mg/dL — ABNORMAL HIGH (ref 70–99)
Glucose-Capillary: 113 mg/dL — ABNORMAL HIGH (ref 70–99)
Glucose-Capillary: 125 mg/dL — ABNORMAL HIGH (ref 70–99)
Glucose-Capillary: 127 mg/dL — ABNORMAL HIGH (ref 70–99)

## 2012-07-16 LAB — URINALYSIS, ROUTINE W REFLEX MICROSCOPIC
Leukocytes, UA: NEGATIVE
Nitrite: NEGATIVE
Specific Gravity, Urine: 1.012 (ref 1.005–1.030)
Urobilinogen, UA: 0.2 mg/dL (ref 0.0–1.0)
pH: 5 (ref 5.0–8.0)

## 2012-07-16 MED ORDER — SIROLIMUS 1 MG/ML PO SOLN
1.2000 mg | Freq: Every day | ORAL | Status: DC
  Administered 2012-07-17 – 2012-08-05 (×20): 1.2 mg via ORAL
  Filled 2012-07-16 (×6): qty 1.2

## 2012-07-16 MED ORDER — OXYCODONE HCL ER 10 MG PO T12A
10.0000 mg | EXTENDED_RELEASE_TABLET | Freq: Two times a day (BID) | ORAL | Status: DC
  Administered 2012-07-16 – 2012-07-18 (×6): 10 mg via ORAL
  Filled 2012-07-16 (×6): qty 1

## 2012-07-16 MED ORDER — HYDRALAZINE HCL 50 MG PO TABS
50.0000 mg | ORAL_TABLET | Freq: Three times a day (TID) | ORAL | Status: DC
  Administered 2012-07-16 – 2012-08-05 (×58): 50 mg via ORAL
  Filled 2012-07-16 (×64): qty 1

## 2012-07-16 NOTE — Evaluation (Signed)
Occupational Therapy Assessment and Plan  Patient Details  Name: Chad [name] MRN: [id number] Date of Birth: Jun 08, 1955  OT Diagnosis: acute pain, muscle weakness (generalized) and swelling of limb Rehab Potential: Rehab Potential: Good ELOS: 2 Weeks   Today's Date: 07/16/2012 Time: 1000-1100 Time Calculation (min): 60 min  Problem List:  Patient Active Problem List   Diagnosis Date Noted  . Femur fracture, left 07/16/2012  . Fracture, femur, distal 07/10/2012  . Diarrhea 03/16/2012  . Nausea and vomiting 03/13/2012  . Hx of amiodarone therapy 03/13/2012  . Steroid long-term use 03/13/2012  . Pulmonary hypertension 03/13/2012  . CHF (congestive heart failure) 12/08/2011  . Community acquired pneumonia 12/08/2011  . PAF (paroxysmal atrial fibrillation) 12/08/2011  . History of renal transplant 12/08/2011  . HTN (hypertension) 12/08/2011  . Respiratory failure with hypoxia 12/08/2011  . CKD (chronic kidney disease) 11/18/2011  . Hypertension 11/18/2011  . OSA (obstructive sleep apnea) 11/13/2011  . Chronic systolic CHF (congestive heart failure) 10/22/2011  . Acute on chronic systolic heart failure 10/03/2011  . Cellulitis of leg, left 08/27/2011  . Atrial flutter 08/27/2011  . Ulcer of left lower leg 08/27/2011  . Conjunctivitis, acute, left eye 08/27/2011  . 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 Septic shock 06/10/2011  . Acute renal failure 06/10/2011  . Cardiomyopathy 06/10/2011  . Acute systolic heart failure 06/09/2011  . Renal transplant disorder 06/05/2011  . HCAP (healthcare-associated pneumonia) 06/04/2011  . Thrombocytopenia 06/04/2011  . Acute respiratory failure 06/04/2011  . ARDS (adult respiratory distress syndrome) 06/04/2011    Past Medical History:  Past Medical History  Diagnosis Date  . Hypertension   . Hepatitis C 1996  . CKD (chronic kidney disease), stage IV     a. s/p renal transplant 1996.  Marland Kitchen Pneumonia   . HLD (hyperlipidemia)   .  Nonischemic cardiomyopathy     a. unknown etiology, EF 30-35% by echo 09/2011;  b. 09/2011 Lexi MV EF 42%, no ischemia/infarct.  . Systolic CHF   . Atrial flutter     a. in the setting of sepsis 05/2011; on amio; no anticoagulation   Past Surgical History:  Past Surgical History  Procedure Laterality Date  . Nephrectomy transplanted organ    . Insertion of dialysis catheter  06/20/2011    Procedure: INSERTION OF DIALYSIS CATHETER;  Surgeon: [name], MD;  Location: [facility] OR;  Service: Vascular;  Laterality: Right;  Ultrasound guided insertion of right internal jugular dialysis catheter  . Multiple extractions with alveoloplasty  06/27/2011    Procedure: MULTIPLE EXTRACION WITH ALVEOLOPLASTY;  Surgeon: [name], DDS;  Location: [facility] OR;  Service: Oral Surgery;  Laterality: N/A;  Extraction  of tooth # 14 with alveoloplasty  . Hip surgery    . Joint replacement      left hip  . Femur fracture surgery Left 07/09/2012    Dr Deno Etienne  . Femur im nail Left 07/10/2012    Procedure: IM FEMORAL NAIL;  Surgeon: Toni Arthurs, MD;  Location: [facility] OR;  Service: Orthopedics;  Laterality: Left;    Assessment & Plan Clinical Impression: Chad [name] is a 57 y.o. right-handed male with history of hypertension, atrial fibrillation maintained on amiodarone, hepatitis C, chronic kidney disease with renal transplant 1996 with baseline creatinine around 3.00 and left hip replacement. Patient well known to inpatient rehabilitation services from admission 06/27/2011 for critical illness myopathy which demanded tracheostomy patient was decannulated discharge to home doing well. Patient independent prior to admission living alone.  Admitted 07/10/2012 after fall landing on his left knee. X-rays and imaging revealed left intra-articular distal femur fracture. Underwent ORIF of left distal femur fracture 07/10/2012 per Dr. [name]. Maintained on subcutaneous Lovenox for DVT prophylaxis. Postoperative anemia 8.3 and has  been transfused. Renal function remains stable with latest creatinine 3.60 followed by renal services advised to continue prednisone as well as sirolimus. Patient is currently TDWB with Bledsoe Brace.  Patient transferred to CIR on 07/15/2012 .    Patient currently requires max-2 person assist with transfers, functional mobility and BADL tasks secondary to muscle weakness, decreased cardiorespiratoy endurance and decreased oxygen support and decreased sitting balance, decreased standing balance, decreased balance strategies and difficulty maintaining precautions.  Patient was a 2 person slide board transfer w/c>bed with verbal and tactile cues for technique, positioning and weight shifts as patient leans back while on sliding board.  Prior to hospitalization, patient reports indepence with BADL & IADL tasks.  Patient will benefit from skilled intervention to increase independence with basic self-care skills and increase level of independence with iADL prior to discharge home with care partner.  Anticipate patient will require 24 hour supervision and minimal physical assistance and follow up home health.  OT - End of Session Activity Tolerance: Tolerates 30+ min activity with multiple rests Endurance Deficit: Yes OT Assessment Rehab Potential: Good Barriers to Discharge: Decreased caregiver support Barriers to Discharge Comments: Patient reports that he can get a schedule of caregivers to provide 24/7 assistance at discharge. OT Plan OT Intensity: Minimum of 1-2 x/day, 45 to 90 minutes OT Frequency: 5 out of 7 days OT Duration/Estimated Length of Stay: 2 Weeks OT Treatment/Interventions: Brewing technologist;Discharge planning;Functional mobility training;Psychosocial support;Patient/family education;Pain management;Self Care/advanced ADL retraining;UE/LE Strength taining/ROM;Therapeutic Exercise;Therapeutic Activities;Wheelchair  propulsion/positioning OT Recommendation Patient destination: Home Follow Up Recommendations: Home health OT Equipment Recommended: 3 in 1 bedside comode Equipment Details: HD drop arm  Skilled Therapeutic Intervention OT Evaluation, self care retraining to   OT Evaluation Precautions/Restrictions  Precautions Precautions: Fall Required Braces or Orthoses: Other Brace/Splint Restrictions Weight Bearing Restrictions: Yes LLE Weight Bearing: Touchdown weight bearing Pain 10/10 LLE, premedicated, rest and repositioned Home Living/Prior Functioning Home Living Lives With: Alone Available Help at Discharge:  (Friends and family, will have a schedule reports 24/7 assist) Type of Home: House Home Access: Stairs to enter [employer] of Steps: 4 steps (other entry 8-9) Entrance Stairs-Rails: Left;Right Home Layout: Two level;Able to live on main level with bedroom/bathroom Bathroom Shower/Tub: Walk-in shower;Door [employer] Toilet: Handicapped height How Accessible: Accessible via walker (wheelchair may not be able to enter) Home Adaptive Equipment: Bedside commode/3-in-1;Walker - rolling Additional Comments: has ramp in a building that he can have placed at back 4 steps off deck Prior Function Level of Independence: Independent with homemaking with ambulation;Independent with transfers;Independent with gait Able to Take Stairs?: Yes Driving: Yes Vocation Requirements: Has not been back to work since last CIR stay, reports repeated hospital admissions  Leisure: Hobbies-yes (Comment) Comments: Has been doing a 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 ADL Refer to FIM levels below Vision/Perception  Vision - History Patient Visual Report: No change from baseline  Cognition Overall Cognitive Status: Within Functional Limits for tasks assessed Arousal/Alertness: Awake/alert Orientation Level:  Oriented X4 Sensation Sensation Light Touch: Impaired by gross assessment (L foot impaired) Stereognosis: Appears Intact Hot/Cold: Appears Intact Proprioception: Appears Intact Coordination Gross Motor Movements are Fluid and  Coordinated: Yes Fine Motor Movements are Fluid and Coordinated: Yes Mobility  Bed Mobility Bed Mobility: Supine to Sit Supine to Sit: 2: Max assist Supine to Sit Details (indicate cue type and reason): Pt prefers method he has been utilizing at home, places both hands on Rt. knee and uses this to assist trunk to upright position. PT needed to complete transition secondary to weakness, Support of Lt. LE throughout also needed. Pt moves slow due to pain.  Sitting - Scoot to Edge of Bed: 3: Mod assist Sitting - Scoot to Edge of Bed Details (indicate cue type and reason): Lt. LE supported. Cues for reciprocal scooting.  Transfers Sit to Stand: 1: +2 Total assist Sit to Stand Details: Verbal cues for safe use of DME/AE;Manual facilitation for weight shifting;Verbal cues for sequencing;Verbal cues for technique Sit to Stand Details (indicate cue type and reason): Pt tends to keep weight shifted posteriorly, has difficulty bringing weight over Rt. foot (?fear). Cues for UE placement, RW in front for support if stand completed however pt unable to fully reach standing even from elevated bed.  Stand to Sit: 1: +2 Total assist Stand to Sit Details: Pt had difficulty getting buttocks safely onto bed as Rt. LE very weak and nearly gives way. Assist needed to help pt scoot posterioly on bed.   Extremity/Trunk Assessment RUE Assessment RUE Assessment: Exceptions to [facility] RUE Strength RUE Overall Strength: Within Functional Limits for tasks performed (overall 5/5 strength) LUE Assessment LUE Assessment: Exceptions to [facility] LUE Strength LUE Overall Strength: Within Functional Limits for tasks assessed LUE Overall Strength Comments: overall 4/5 strength  FIM:  FIM -  Eating Eating Activity: 7: Complete independence:no helper FIM - Grooming Grooming Steps: Wash, rinse, dry face;Wash, rinse, dry hands Grooming: 4: Patient completes 3 of 4 or 4 of 5 steps FIM - Bathing Bathing Steps Patient Completed: Chest;Right Arm;Left Arm;Abdomen;Front perineal area;Buttocks Bathing: 3: Mod-Patient completes 5-7 11f 10 parts or 50-74% FIM - Upper Body Dressing/Undressing Upper body dressing/undressing: 0: Wears gown/pajamas-no public clothing FIM - Banker Devices: Arm rests;Sliding board Bed/Chair Transfer: 4: Sit > Supine: Min A (steadying pt. > 75%/lift 1 leg);1: Two helpers   Refer to Care Plan for Long Term Goals  Recommendations for other services: None  Discharge Criteria: Patient will be discharged from OT if patient refuses treatment 3 consecutive times without medical reason, if treatment goals not met, if there is a change in medical status, if patient makes no progress towards goals or if patient is discharged from hospital.  The above assessment, treatment plan, treatment alternatives and goals were discussed and mutually agreed upon: by patient  Zelma Mazariego 07/16/2012, 3:04 PM

## 2012-07-16 NOTE — Progress Notes (Signed)
Patient information reviewed and entered into eRehab system by Makalya Nave, RN, CRRN, PPS Coordinator.  Information including medical coding and functional independence measure will be reviewed and updated through discharge.    

## 2012-07-16 NOTE — Progress Notes (Signed)
Occupational Therapy Session Note  Patient Details  Name: Chad [name] MRN: [id number] Date of Birth: [birth year]  Today's Date: 07/16/2012  Short Term Goals: Week 1:  OT Short Term Goal 1 (Week 1): LB Bath:  Mod assist sitting EOB to include lateral leans and use of AE OT Short Term Goal 2 (Week 1): LB Dressing:  Mod assist to donn pants/shorts sitting EOB to include lateral leans and use of AE OT Short Term Goal 3 (Week 1): Toilet Transfer: Mod Assist for drop arm commode transfers using slide board PRN OT Short Term Goal 4 (Week 1): Toileting: Mod assist with pants down and up using lateral leans OT Short Term Goal 5 (Week 1): BUE strengthening:  Supervision performing HEP  Skilled Therapeutic Interventions/Progress Updates:  Session Note #1: Time: 1115-1200 (45 mins) Pt reported 10/10 pain in L leg. RN notified.  Upon entering room, pt supine in bed. Pt->EOB where OT assessed UE ROM/strength, sensation, and coordination. OT educated pt on use of AE. Pt used teach back method for demonstrating donning of sock using sock aid. Pt EOB->supine where UE strengthening/endurance ex's were completed using T-band. Pt's O2 stats remained in the 89-92% range throughout tx session on 3.5L O2. When O2 stats dropped 90% and below OT educated pt on pursed-lip breathing technique. BA turned on. At end of tx session, pt supine in bed with call bell within reach.  Session Note #2: Time: 1405-1435 (30 mins) Pt reported 9/10 pain in LLE. Pt recently received pain meds.  Upon entering room, pt supine in bed. Pt->EOB (min assist for stabilizing LLE when moving to EOB). From here skilled intervention focused on lat leans (for increasing independence with LB ADL's and positioning of slide board), BUE/RLE strengthening (bed push-ups, mini squats @ bed level), overall activity tolerance, and scoots in bed using BUE to increase independence with bed mobility. At end of tx session, pt supine in bed with call bell  within reach. BA turned on.  Therapy Documentation Precautions:  Precautions Precautions: Fall Required Braces or Orthoses: Other Brace/Splint Other Brace/Splint: Bledsoe Brace Restrictions Weight Bearing Restrictions: Yes LLE Weight Bearing: Touchdown weight bearing (checked with PA to clarify order)  See FIM for current functional status  Therapy/Group: Individual Therapy  [name] 07/16/2012, 2:38 PM

## 2012-07-16 NOTE — Progress Notes (Signed)
[facility] Rounding Note  ASSESSMENT/RECOMMENDATIONS: 57 yo AA male with history of hypertension, hepatitis C, chronic kidney disease with renal transplant 1996 with baseline creatinine around 3.5 (3.42 on 06/30/12)  and left hip replacement, admitted 07/10/2012 after fall landing on his left knee. X-rays and imaging revealed left intra-articular distal femur fracture. Underwent ORIF of left distal femur fracture 07/10/2012 by Dr. [name].  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  Will reorder S/p ORIF left distal femur fracture Rehab, pain control, ortho f/u Low grade fevers, leukocytosis Finished maxipine and vanco HTN Blood pressures were soft Reduced hydralazine 75 tid -->25 TID with hold for systolic under 130 (6/16). BP up now --->increase hydralazine to 50 tid (6/20) ABLA with Hb down to 8.3 post op, further reduced to 7 (6/17) Iron low Dosed Feraheme X 1 6/15 Started Darbe 100/week (6/16) Transfused 1 unit  (6/17) with lasix 80 pre Cardiomyopathy EF 30-35 AFib  On amio HepC +  Subjective:  Complains of a lot up left leg pain since last night Participating in rehab Says endurance OK, just hurts  Objective Vital signs in last 24 hours: Filed Vitals:   07/15/12 1350 07/15/12 1545 07/16/12 0553  BP: 156/82 161/99 173/97  Pulse: 89 90 85  Temp: 98.9 F (37.2 C) 99.7 F (37.6 C) 99.8 F (37.7 C)  TempSrc:  Oral Oral  Resp: 18 14 16   Weight:   123 kg (271 lb 2.7 oz)  SpO2: 98% 95% 96%   Weight change:   Intake/Output Summary (Last 24 hours) at 07/16/12 1310 Last data filed at 07/16/12 0836  Gross per 24 hour  Intake    230 ml  Output   2101 ml   Net  -1871 ml   Physical Exam: BP 173/97  Pulse 85  Temp(Src) 99.8 F (37.7 C) (Oral)  Resp 16  Wt 123 kg (271 lb 2.7 oz)  BMI 35.78 kg/m2  SpO2 96% NAD Lungs  clear anteriorly S1S2 no S3 Irreg Abdomen large no focal tenderness  Right renal allograft non tender Left leg in brace Right leg trace edema/wearing TED stocking  Labs: Basic Metabolic Panel:  Recent Labs Lab 07/10/12 0540  07/11/12 0525 07/12/12 0440 07/13/12 0450 07/14/12 0350 07/15/12 0540 07/15/12 1703 07/16/12 0455  NA 137  --  135 133* 134* 135 133*  --  136  K 3.4*  --  3.3* 3.8 4.1 3.8 3.5  --  3.6  CL 99  --  99 97 99 98 96  --  97  CO2 24  --  25 23 23 25 25   --  25  GLUCOSE 119*  --  105* 135* 101* 123* 107*  --  116*  BUN 92*  --  79* 82* 84* 88* 82*  --  77*  CREATININE 4.04*  < > 3.83* 4.26* 4.06* 3.84* 3.60* 3.54* 3.39*  CALCIUM 8.7  --  8.0* 8.2* 8.4 8.6 8.5  --  9.0  PHOS  --   --   --   --  6.4* 5.7* 4.8*  --   --   < > = values in this interval not displayed. Liver Function Tests:  Recent Labs Lab  07/11/12 0525  07/14/12 0350 07/15/12 0540 07/16/12 0455  AST 21  --   --   --  65*  ALT 7  --   --   --  12  ALKPHOS 48  --   --   --  53  BILITOT 0.3  --   --   --  0.4  PROT 5.7*  --   --   --  6.4  ALBUMIN 2.6*  < > 2.4* 2.2* 2.1*  < > = values in this interval not displayed.  Recent Labs Lab 07/09/12 2310  07/11/12 0525  07/14/12 0350 07/15/12 0540 07/15/12 1703 07/16/12 0455  WBC 12.6*  < > 18.2*  < > 13.2* 13.4* 14.0* 15.4*  NEUTROABS 10.6*  --  13.6*  --   --   --   --  11.7*  HGB 11.4*  < > 8.3*  < > 7.7* 7.5* 7.6* 7.6*  HCT 34.8*  < > 26.3*  < > 23.3* 23.6* 23.5* 23.6*  MCV 88.1  < > 89.8  < > 87.3 89.1 89.4 89.4  PLT 140*  < > 113*  < > 113* 130* 142* 158  < > = values in this interval not displayed.  Recent Labs Lab 07/15/12 2011 07/16/12 0018 07/16/12 0358 07/16/12 0719 07/16/12 1145  GLUCAP 139* 125* 113* 99 129*    Recent Labs Lab 07/10/12 0540   IRON 39*  TIBC 235  FERRITIN 121   Studies/Results: No results found. Medications:   . allopurinol  100 mg Oral BID  . amiodarone  200 mg Oral Daily  . aspirin EC  81 mg Oral Daily  . atorvastatin  20 mg Oral q1800  . carvedilol  25 mg Oral BID WC  . [START ON 07/19/2012] darbepoetin (ARANESP) injection - NON-DIALYSIS  100 mcg Subcutaneous Q Mon-1800  . docusate sodium  100 mg Oral BID  . enoxaparin (LOVENOX) injection  30 mg Subcutaneous Q24H  . furosemide  80 mg Oral BID  . hydrALAZINE  25 mg Oral TID  . insulin aspart  0-9 Units Subcutaneous TID AC & HS  . isosorbide mononitrate  60 mg Oral Daily  . OxyCODONE  10 mg Oral Q12H  . potassium chloride  10 mEq Oral Daily  . predniSONE  10 mg Oral Q breakfast  . senna  2 tablet Oral BID  . [START ON 07/17/2012] sirolimus  1.2 mg Oral Daily    I  have reviewed scheduled and prn medications.    Camille Bal, MD [facility] [phone] pager 07/16/2012, 1:10 PM

## 2012-07-16 NOTE — Progress Notes (Addendum)
Subjective/Complaints: Having a lot of pain, especially since 2am last night---left leg---throbbing A 12 point review of systems has been performed and if not noted above is otherwise negative.   Objective: Vital Signs: Blood pressure 173/97, pulse 85, temperature 99.8 F (37.7 C), temperature source Oral, resp. rate 16, weight 123 kg (271 lb 2.7 oz), SpO2 96.00%. No results found.  Recent Labs  07/15/12 1703 07/16/12 0455  WBC 14.0* 15.4*  HGB 7.6* 7.6*  HCT 23.5* 23.6*  PLT 142* 158    Recent Labs  07/15/12 0540 07/15/12 1703 07/16/12 0455  NA 133*  --  136  K 3.5  --  3.6  CL 96  --  97  GLUCOSE 107*  --  116*  BUN 82*  --  77*  CREATININE 3.60* 3.54* 3.39*  CALCIUM 8.5  --  9.0   CBG (last 3)   Recent Labs  07/16/12 0018 07/16/12 0358 07/16/12 0719  GLUCAP 125* 113* 99    Wt Readings from Last 3 Encounters:  07/16/12 123 kg (271 lb 2.7 oz)  07/13/12 122.471 kg (270 lb)  07/13/12 122.471 kg (270 lb)    Physical Exam:  Constitutional: He is oriented to person, place, and time.  HENT:  Head: Normocephalic.  Eyes: EOM are normal.  Neck: Neck supple. No thyromegaly present.  Cardiovascular:  Cardiac rate controlled  Pulmonary/Chest:  Decreased breath sounds but clear to auscultation  Abdominal: Soft. Bowel sounds are normal. He exhibits no distension.  Neurological: He is alert and oriented to person, place, and time.  Patient follows full commands. Motor function within normal limits in UE's and RLE. Sensory function nl  Skin:  Surgical site is dressed. Left leg in KI/ appropriately tender  Psychiatric: he anxious but otherwise His behavior is normal. Judgment and thought content normal  Neuro:  Eyes without evidence of nystagmus  Tone is normal without evidence of spasticity  Cerebellar exam shows no evidence of ataxia on finger nose finger or heel to shin testing  No evidence of trunkal ataxia  Motor strength is 5/5 in bilateral deltoid,  biceps, triceps, finger flexors and extensors, wrist flexors and extensors, hip flexors, knee flexors and extensors, ankle dorsiflexors, plantar flexors, invertors and evertors, toe flexors and extensors  Sensory exam is normal to pinprick, proprioception and light touch in the upper and lower limbs    Assessment/Plan: 1. Functional deficits secondary to distal left femur fx which require 3+ hours per day of interdisciplinary therapy in a comprehensive inpatient rehab setting. Physiatrist is providing close team supervision and 24 hour management of active medical problems listed below. Physiatrist and rehab team continue to assess barriers to discharge/monitor patient progress toward functional and medical goals. FIM:                   Comprehension Comprehension Mode: Auditory Comprehension: 5-Understands complex 90% of the time/Cues < 10% of the time  Expression Expression Mode: Verbal Expression: 5-Expresses basic needs/ideas: With no assist  Social Interaction Social Interaction: 6-Interacts appropriately with others with medication or extra time (anti-anxiety, antidepressant).  Problem Solving Problem Solving: 5-Solves complex 90% of the time/cues < 10% of the time  Memory Memory: 4-Recognizes or recalls 75 - 89% of the time/requires cueing 10 - 24% of the time  Medical Problem List and Plan:  1. Left distal femur fracture after fall. Status post ORIF 07/10/2012  2. DVT Prophylaxis/Anticoagulation: Subcutaneous Lovenox. Monitor platelet counts and any signs of bleeding. Check vascular study today 3. Pain  Management: Oxycodone as needed. Monitor with increased mobility   -begin oxycontin for better pain control  -check femur xrays today 4. Neuropsych: This patient is capable of making decisions on his/her own behalf.  5. Acute blood loss anemia. Latest hemoglobin 7.5. Followup CBC and transfuse as appropriate. Continue Aranesp as advised  6. Chronic kidney disease  and renal transplant 1996. Baseline creatinine 3.00. Followup per renal services. Continue prednisone/Sirolimus as directed  7. Hypertension/atrial fibrillation. Amiodarone 200 mg daily, coreg 25 mg twice a day, Lasix 80 mg twice a day, hydralazine 25 mg 3 times a day, M.D. or 60 mg daily. Monitor with increased mobility. Patient no chest pain or shortness of breath  8. Nonischemic cardiomyopathy. Continue aspirin therapy.  9. Systolic congestive heart failure. Continue Lasix as advised. Monitor for any signs of fluid overload  10. Non-insulin-dependent diabetes mellitus. Hemoglobin A1c 5.7. Blood sugars appear to be driven by prednisone. Continue sliding scale check blood sugars a.c. and at bedtime  11. History of gout. Continue allopurinol. Monitor for any gout flareups.  12. Hyperlipidemia. Lipitor  13. Obstructive sleep apnea. CPAP  14. Leukocytosis: no signs of cellulitis  -check urine  -IS  -likely steroid effect  LOS (Days) 1 A FACE TO FACE EVALUATION WAS PERFORMED  Becky Berberian T 07/16/2012 7:40 AM

## 2012-07-16 NOTE — Plan of Care (Addendum)
Overall Plan of Care (IPOC) Patient Details Name: Chad [name] MRN: [id number] DOB: Aug 07, 1955  Diagnosis:  Left distal femur fx  Co-morbidities: abla, pain mgt, chf, dm, gout  Functional Problem List  Patient demonstrates impairments in the following areas: Balance, Bladder, Bowel, Edema, Endurance, Medication Management, Pain, Safety and Skin Integrity  Basic ADL's: grooming, bathing, dressing and toileting Advanced ADL's: simple meal preparation and light housekeeping  Transfers:  bed mobility, bed to chair, toilet and car Locomotion:  wheelchair mobility   Additional Impairments:  None  Anticipated Outcomes Item Anticipated Outcome  Eating/Swallowing  independent  Basic self-care  Supervision-min assist  Tolieting  Mod assist  Bowel/Bladder  Continent bowel and bladder with toileting mod independent  Transfers  Supervision   Locomotion  Modified independent wheelchair level, mod assist gait short distance  Communication    Cognition    Pain  0 on scale of1-10  Safety/Judgment  supervision  Other     Therapy Plan: PT Intensity: Minimum of 1-2 x/day ,45 to 90 minutes PT Frequency: 5 out of 7 days PT Duration Estimated Length of Stay: 2-2.5 weeks OT Intensity: Minimum of 1-2 x/day, 45 to 90 minutes OT Frequency: 5 out of 7 days OT Duration/Estimated Length of Stay: 2 Weeks      Team Interventions: Item RN PT OT SLP SW TR Other  Self Care/Advanced ADL Retraining   x      Neuromuscular Re-Education  x x      Therapeutic Activities  x x   x   UE/LE Strength Training/ROM  x x   x   UE/LE Coordination Activities         Visual/Perceptual Remediation/Compensation         DME/Adaptive Equipment Instruction  x x   x   Therapeutic Exercise  x x   x   Balance/Vestibular Training  x x   x   Patient/Family Education x x x   x   Cognitive Remediation/Compensation         Functional Mobility Training  x x   x   Ambulation/Gait Training  x       Stair  Training  x       Wheelchair Propulsion/Positioning  x    x   Functional Tourist information centre manager Reintegration  x x   x   Dysphagia/Aspiration Film/video editor         Bladder Management x        Bowel Management x        Disease Management/Prevention x x x      Pain Management x x x      Medication Management x        Skin Care/Wound Management x x x      Splinting/Orthotics  x       Discharge Planning x x x   x   Psychosocial Support x x x   x                      Team Discharge Planning: Destination: PT-Home ,OT- Home , SLP-  Projected Follow-up: PT-Home health PT;24 hour supervision/assistance, OT-  Home health OT, SLP-  Projected Equipment Needs: PT-Wheelchair cushion (measurements);Wheelchair (measurements);Rolling walker with 5" wheels;Sliding board, OT- 3 in 1 bedside comode (heavy duty drop arm), SLP-  Patient/family involved in discharge planning: PT- Patient,  OT-Patient, SLP-  MD ELOS: 2.5 weeks Medical Rehab Prognosis:  Good Assessment: The patient has been admitted for CIR therapies. The team will be addressing, functional mobility, strength, stamina, balance, safety, adaptive techniques/equipment, self-care, bowel and bladder mgt, patient and caregiver education, pain mgt, ortho precautions, skin care. Goals have been set at supervision to mod assist with basic mobility. Supervision to mod I with transfers and wc mobility.    Ranelle Oyster, MD, FAAPMR      See Team Conference Notes for weekly updates to the plan of care

## 2012-07-16 NOTE — Progress Notes (Signed)
Note reviewed and accurately reflects treatment session.   

## 2012-07-16 NOTE — Evaluation (Signed)
Physical Therapy Assessment and Plan  Patient Details  Name: Chad [name] MRN: [id number] Date of Birth: [birth year]  PT Diagnosis: Difficulty walking, Impaired sensation, Muscle weakness and Pain in Lt. LE Rehab Potential:   ELOS: 2-2.5 weeks   Today's Date: 07/16/2012 Time: 0830-0930 Time Calculation (min): 60 min  Problem List:  Patient Active Problem List   Diagnosis Date Noted  . Femur fracture, left 07/16/2012  . Fracture, femur, distal 07/10/2012  . Diarrhea 03/16/2012  . Nausea and vomiting 03/13/2012  . Hx of amiodarone therapy 03/13/2012  . Steroid long-term use 03/13/2012  . Pulmonary hypertension 03/13/2012  . CHF (congestive heart failure) 12/08/2011  . Community acquired pneumonia 12/08/2011  . PAF (paroxysmal atrial fibrillation) 12/08/2011  . History of renal transplant 12/08/2011  . HTN (hypertension) 12/08/2011  . Respiratory failure with hypoxia 12/08/2011  . CKD (chronic kidney disease) 11/18/2011  . Hypertension 11/18/2011  . OSA (obstructive sleep apnea) 11/13/2011  . Chronic systolic CHF (congestive heart failure) 10/22/2011  . Acute on chronic systolic heart failure 10/03/2011  . Cellulitis of leg, left 08/27/2011  . Atrial flutter 08/27/2011  . Ulcer of left lower leg 08/27/2011  . Conjunctivitis, acute, left eye 08/27/2011  . 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 Septic shock 06/10/2011  . Acute renal failure 06/10/2011  . Cardiomyopathy 06/10/2011  . Acute systolic heart failure 06/09/2011  . Renal transplant disorder 06/05/2011  . HCAP (healthcare-associated pneumonia) 06/04/2011  . Thrombocytopenia 06/04/2011  . Acute respiratory failure 06/04/2011  . ARDS (adult respiratory distress syndrome) 06/04/2011    Past Medical History:  Past Medical History  Diagnosis Date  . Hypertension   . Hepatitis C 1996  . CKD (chronic kidney disease), stage IV     a. s/p renal transplant 1996.  Marland Kitchen Pneumonia   . HLD (hyperlipidemia)   .  Nonischemic cardiomyopathy     a. unknown etiology, EF 30-35% by echo 09/2011;  b. 09/2011 Lexi MV EF 42%, no ischemia/infarct.  . Systolic CHF   . Atrial flutter     a. in the setting of sepsis 05/2011; on amio; no anticoagulation   Past Surgical History:  Past Surgical History  Procedure Laterality Date  . Nephrectomy transplanted organ    . Insertion of dialysis catheter  06/20/2011    Procedure: INSERTION OF DIALYSIS CATHETER;  Surgeon: [name], MD;  Location: [facility] OR;  Service: Vascular;  Laterality: Right;  Ultrasound guided insertion of right internal jugular dialysis catheter  . Multiple extractions with alveoloplasty  06/27/2011    Procedure: MULTIPLE EXTRACION WITH ALVEOLOPLASTY;  Surgeon: [name], DDS;  Location: [facility] OR;  Service: Oral Surgery;  Laterality: N/A;  Extraction  of tooth # 14 with alveoloplasty  . Hip surgery    . Joint replacement      left hip  . Femur fracture surgery Left 07/09/2012    Dr Deno Etienne  . Femur im nail Left 07/10/2012    Procedure: IM FEMORAL NAIL;  Surgeon: Toni Arthurs, MD;  Location: [facility] OR;  Service: Orthopedics;  Laterality: Left;    Assessment & Plan Clinical Impression: Chad [name] is a 57 y.o. right-handed male with history of hypertension, atrial fibrillation maintained on amiodarone, hepatitis C, chronic kidney disease with renal transplant 1996 with baseline creatinine around 3.00 and left hip replacement. Patient well known to inpatient rehabilitation services from admission 06/27/2011 for critical illness myopathy which demanded tracheostomy patient was decannulated discharge to home doing well. Patient independent prior to admission living  alone. Admitted 07/10/2012 after fall landing on his left knee. X-rays and imaging revealed left intra-articular distal femur fracture. Underwent ORIF of left distal femur fracture 07/10/2012 per Dr. [name]. Advised nonweightbearing left lower extremity with knee immobilizer restrictions.  Maintained on subcutaneous Lovenox for DVT prophylaxis. Postoperative anemia 8.3 and has been transfused. Renal function remains stable with latest creatinine 3.60 followed by renal services advised to continue prednisone as well as sirolimus. Patient transferred to CIR on 07/15/2012 .   Patient currently requires max to 2 person assist with mobility secondary to muscle weakness, impaired timing and sequencing and decreased standing balance, decreased balance strategies and difficulty maintaining precautions.  Pt performed sliding board transfer with only mod assist needed, 2 persons needed for safety and holding chair/board. Pt has poor motor mechanics during transfer and needs frequent cues for weight shift, positioning, and precautions. Prior to hospitalization, patient was independent  with mobility and lived with Alone in a House home.  Home access is 4 steps (other entry 8-9)Stairs to enter.  Patient will benefit from skilled PT intervention to maximize safe functional mobility, minimize fall risk and decrease caregiver burden for planned discharge home with 24 hour supervision.  Anticipate patient will benefit from follow up HH at discharge.  PT - End of Session Endurance Deficit: Yes PT Assessment Barriers to Discharge: Decreased caregiver support Barriers to Discharge Comments: Lives alone, friends helping PRN PT Plan PT Intensity: Minimum of 1-2 x/day ,45 to 90 minutes PT Frequency: 5 out of 7 days PT Duration Estimated Length of Stay: 2-2.5 weeks PT Treatment/Interventions: Ambulation/gait training;Balance/vestibular training;Community reintegration;Discharge planning;Disease management/prevention;Patient/family education;Therapeutic Exercise;Pain management;Therapeutic Activities;Neuromuscular re-education;Wheelchair propulsion/positioning;Stair training;Functional mobility training;Splinting/orthotics;Skin care/wound management;DME/adaptive equipment instruction;Psychosocial support;UE/LE  Strength taining/ROM PT Recommendation Follow Up Recommendations: Home health PT;24 hour supervision/assistance Patient destination: Home Equipment Recommended: Wheelchair cushion (measurements);Wheelchair (measurements);Rolling walker with 5" wheels;Sliding board  Skilled Therapeutic Intervention Discussed need to have friends/family place ramp back on house for home D/C. Discussed need for assist and options if he is unable to ambulate into bathroom. Sliding board transfer slightly down hill and to Rt. Performed with overall mod assist, two persons for safety and holding equipment. Pt does need assist with positioning Lt. LE throughout treatment. He is limited by pain and significant weakness at this time.   PT Evaluation Precautions/Restrictions Precautions Precautions: Fall Required Braces or Orthoses: Other Brace/Splint Other Brace/Splint: Bledsoe Brace Restrictions Weight Bearing Restrictions: Yes LLE Weight Bearing: Touchdown weight bearing (checked with PA to clarify order) General   Vital Signs  Pain Pain Assessment Pain Assessment: 0-10 Pain Score:   9 Pain Type: Surgical pain Pain Location: Leg Pain Orientation: Left Pain Descriptors / Indicators: Aching Pain Frequency: Constant Pain Onset: On-going Patients Stated Pain Goal: 3 Pain Intervention(s): Medication (See eMAR) Home Living/Prior Functioning Home Living Lives With: Alone Available Help at Discharge:  (Friends and family, will have a schedule reports 24/7 assist) Type of Home: House Home Access: Stairs to enter [employer] of Steps: 4 steps (other entry 8-9) Entrance Stairs-Rails: Left;Right Home Layout: Two level;Able to live on main level with bedroom/bathroom Bathroom Shower/Tub: Walk-in shower;Door [employer] Toilet: Handicapped height How Accessible: Accessible via walker (wheelchair may not be able to enter) Home Adaptive Equipment: Bedside commode/3-in-1;Walker - rolling Additional  Comments: has ramp in a building that he can have placed at back 4 steps off deck Prior Function Level of Independence: Independent with homemaking with ambulation;Independent with transfers;Independent with gait Able to Take Stairs?: Yes Driving: Yes Vocation Requirements: Has not been  back to work since last CIR stay, reports repeated hospital admissions  Leisure: Hobbies-yes (Comment) Comments: Has been doing a lot of volunteer 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  Cognition Overall Cognitive Status: Within Functional Limits for tasks assessed Arousal/Alertness: Awake/alert (minimally lethargic initially ?medication) Orientation Level: Oriented X4 Sensation Sensation Light Touch: Impaired by gross assessment (L foot impaired) Stereognosis: Appears Intact Hot/Cold: Appears Intact Proprioception: Appears Intact Coordination Gross Motor Movements are Fluid and Coordinated: Yes Fine Motor Movements are Fluid and Coordinated: Yes  Mobility Bed Mobility Bed Mobility: Supine to Sit Supine to Sit: 2: Max assist Supine to Sit Details (indicate cue type and reason): Pt prefers method he has been utilizing at home, places both hands on Rt. knee and uses this to assist trunk to upright position. PT needed to complete transition secondary to weakness, Support of Lt. LE throughout also needed. Pt moves slow due to pain.  Sitting - Scoot to Edge of Bed: 3: Mod assist Sitting - Scoot to Edge of Bed Details (indicate cue type and reason): Lt. LE supported. Cues for reciprocal scooting.  Transfers Sit to Stand: 1: +2 Total assist Sit to Stand Details: Verbal cues for safe use of DME/AE;Manual facilitation for weight shifting;Verbal cues for sequencing;Verbal cues for technique Sit to Stand Details (indicate cue type and reason): Pt tends to keep weight shifted posteriorly, has difficulty bringing weight over Rt. foot (?fear). Cues for  UE placement, RW in front for support if stand completed however pt unable to fully reach standing even from elevated bed.  Stand to Sit: 1: +2 Total assist Stand to Sit Details: Pt had difficulty getting buttocks safely onto bed as Rt. LE very weak and nearly gives way. Assist needed to help pt scoot posterioly on bed.  Stand Pivot Transfers:  (unable ) Lateral/Scoot Transfers: 1: +2 Total assist;3: Mod assist (2 persons for safety (hold board and chair). ) Lateral/Scoot Transfer Details (indicate cue type and reason): Cues for sequencing, positioning, and use of a slidingboard. total assist for placement. Mod assist for anterior weight shift, transfer performed to the Rt and slightly downhill which is easier for pt.  Locomotion  Ambulation Ambulation: No (unsafe to attempt as pt uanble to reach standing) Ambulation/Gait Assistance:  (unsafe to perform, pt unable to reach standing) Stairs / Additional Locomotion Stairs: No (unsafe to attempt) Naval architect Mobility: Yes Wheelchair Assistance: 4: Min Education officer, museum: Both upper extremities Wheelchair Parts Management: Supervision/cueing Distance: 45'   Balance  unable to fully assess as pt unable to fully reach standing Extremity Assessment  RUE Assessment RUE Assessment: Exceptions to [facility] RUE Strength RUE Overall Strength: Within Functional Limits for tasks performed (overall 5/5 strength) LUE Assessment LUE Assessment: Exceptions to [facility] LUE Strength LUE Overall Strength: Within Functional Limits for tasks assessed LUE Overall Strength Comments: overall 4/5 strength RLE Assessment RLE Assessment: Exceptions to [facility] RLE Strength Right Hip Flexion: 2+/5 Right Knee Flexion: 2+/5 Right Knee Extension: 3-/5 Right Ankle Dorsiflexion: 3/5 Right Ankle Plantar Flexion: 3/5 LLE Assessment LLE Assessment: Exceptions to WFL LLE AROM (degrees) LLE Overall AROM Comments: Slightly decreased dorsiflexion, other  ROM not assessed due to Bledsoe Brace LLE Strength LLE Overall Strength Comments: Deficits but not MMT due to precautions  FIM:  FIM - Banker Devices: Bed rails Bed/Chair Transfer: 2: Supine > Sit: Max A (lifting assist/Pt. 25-49%);1: Two helpers FIM - Locomotion: Wheelchair Distance:  45' Locomotion: Wheelchair: 1: Travels less than 50 ft with minimal assistance (Pt.>75%) FIM - Locomotion: Ambulation Ambulation/Gait Assistance:  (unsafe to perform, pt unable to reach standing) FIM - Locomotion: Stairs Locomotion: Stairs:  (unsafe to attempt as pt unable to reach standing)   Refer to Care Plan for Long Term Goals  Recommendations for other services: None  Discharge Criteria: Patient will be discharged from PT if patient refuses treatment 3 consecutive times without medical reason, if treatment goals not met, if there is a change in medical status, if patient makes no progress towards goals or if patient is discharged from hospital.  The above assessment, treatment plan, treatment alternatives and goals were discussed and mutually agreed upon: by patient  [name] 07/16/2012, 12:26 PM

## 2012-07-17 ENCOUNTER — Inpatient Hospital Stay (HOSPITAL_COMMUNITY): Payer: Medicaid Other

## 2012-07-17 ENCOUNTER — Inpatient Hospital Stay (HOSPITAL_COMMUNITY): Payer: Medicaid Other | Admitting: Occupational Therapy

## 2012-07-17 DIAGNOSIS — I10 Essential (primary) hypertension: Secondary | ICD-10-CM

## 2012-07-17 DIAGNOSIS — S8290XS Unspecified fracture of unspecified lower leg, sequela: Secondary | ICD-10-CM

## 2012-07-17 LAB — RENAL FUNCTION PANEL
CO2: 26 mEq/L (ref 19–32)
Chloride: 97 mEq/L (ref 96–112)
GFR calc Af Amer: 23 mL/min — ABNORMAL LOW (ref >90)
Glucose, Bld: 94 mg/dL (ref 70–99)
Phosphorus: 4.6 mg/dL (ref 2.3–4.6)
Potassium: 3.2 mEq/L — ABNORMAL LOW (ref 3.5–5.1)
Sodium: 136 mEq/L (ref 135–145)

## 2012-07-17 LAB — URINE CULTURE: Culture: NO GROWTH

## 2012-07-17 LAB — GLUCOSE, CAPILLARY
Glucose-Capillary: 96 mg/dL (ref 70–99)
Glucose-Capillary: 147 mg/dL — ABNORMAL HIGH (ref 70–99)
Glucose-Capillary: 162 mg/dL — ABNORMAL HIGH (ref 70–99)

## 2012-07-17 LAB — CBC
HCT: 24.2 % — ABNORMAL LOW (ref 39.0–52.0)
RBC: 2.7 MIL/uL — ABNORMAL LOW (ref 4.22–5.81)
RDW: 17.5 % — ABNORMAL HIGH (ref 11.5–15.5)
WBC: 15.2 10*3/uL — ABNORMAL HIGH (ref 4.0–10.5)

## 2012-07-17 NOTE — Progress Notes (Signed)
Physical Therapy Session Note  Patient Details  Name: Chad [name] MRN: [id number] Date of Birth: 1955/10/11  Today's Date: 07/17/2012 Time: 4098-1191 Time Calculation (min): 38 min  Short Term Goals: Week 1:  PT Short Term Goal 1 (Week 1): Pt will perform sliding board transfers to level surfaces with min assist.  PT Short Term Goal 2 (Week 1): Pt will perform sit <> stand from elevated mat with +2 total assist. PT Short Term Goal 3 (Week 1): Pt will propel wheelchair x 100' with supervision.  Skilled Therapeutic Interventions/Progress Updates:    Pt still up in w/c from this AM but reports pain still an issue (just received medication about 30 min ago per pt report). Therapist adjusted and changed leg rest for increased support on LLE which relieved some discomfort. Focused on w/c propulsion for functional strengthening and endurance training on unit on tiled surfaces as well as carpeted surfaces to simulate home environment with overall S. Pt chose to stay up in w/c still at end of session to visit with friends.   Therapy Documentation Precautions:  Precautions Precautions: Fall Required Braces or Orthoses: Other Brace/Splint Other Brace/Splint: Bledsoe Brace Restrictions Weight Bearing Restrictions: Yes LLE Weight Bearing: Touchdown weight bearing Pain: Pain Assessment Pain Assessment: 0-10 Pain Score:   8 Pain Type: Surgical pain Pain Location: Leg Pain Orientation: Left Pain Descriptors / Indicators: Aching Pain Frequency: Constant Pain Onset: On-going Patients Stated Pain Goal: 2 Pain Intervention(s): Medication (See eMAR)  See FIM for current functional status  Therapy/Group: Individual Therapy  Karolee Stamps [facility] 07/17/2012, 1:41 PM

## 2012-07-17 NOTE — Progress Notes (Signed)
Patient refused CPAP tonight. No machine in the room at this time. RN aware. Patient explained if they changed their mind to just have them call Respiratory.

## 2012-07-17 NOTE — Progress Notes (Signed)
Physical Therapy Session Note  Patient Details  Name: Chad [name] MRN: [id number] Date of Birth: 08-05-1955  Today's Date: 07/17/2012 Time: 0925-1030 Time Calculation (min): 65 min  Short Term Goals: Week 1:  PT Short Term Goal 1 (Week 1): Pt will perform sliding board transfers to level surfaces with min assist.  PT Short Term Goal 2 (Week 1): Pt will perform sit <> stand from elevated mat with +2 total assist. PT Short Term Goal 3 (Week 1): Pt will propel wheelchair x 100' with supervision.  Skilled Therapeutic Interventions/Progress Updates:    Initially pt declining OOB due to increased pain in LLE. Agreeable to start with LE therex in supine for functional strengthening and assisted pt finishing bathing RLE. Completed active assisted therex including heel slides, ankle pumps and hip abduction/adduciton on RLE x 10 reps x2 sets and on LLE hip abduction and ankle pumps x 10 reps x 2 sets as well. Therapist adjusted Bledsoe brace for correct positioning as it was placed down too low. Pt then agreeable to OOB and completed supine to sit with min A (to manage LLE only) and mod A transfer with SB to L (second person to stabiliize w/c) with cues for hand placement. Pt completed teeth brushing at sink w/c level and positioned with LLE elevated in w/c with all needs in place.   Therapy Documentation Precautions:  Precautions Precautions: Fall Required Braces or Orthoses: Other Brace/Splint Other Brace/Splint: Bledsoe Brace Restrictions Weight Bearing Restrictions: Yes LLE Weight Bearing: Touchdown weight bearing Pain:  Reports significant pain in LLE - premedicated.  See FIM for current functional status  Therapy/Group: Individual Therapy  Karolee Stamps [facility] 07/17/2012, 10:30 AM

## 2012-07-17 NOTE — Progress Notes (Signed)
Patient ID: Chad [name], male   DOB: 12-05-1955, 57 y.o.   MRN: [id number] Subjective/Complaints:  6/21.  57 y/o with L distal femur fracture.  H/O sepsis, ARDS, systolic HF, ARF and HTN.  Doing well  Exam-   Gen- comfortable, mild cough HEENT- n/c O2 in place; low hanging soft palate Chest- decr BS but clear CV- regular Abd- benign, no distension Extr-  No edema   Patient Vitals for the past 24 hrs:  BP Temp Temp src Pulse Resp SpO2  07/17/12 0543 155/92 mmHg 98.3 F (36.8 C) Oral 78 20 99 %  07/16/12 2119 155/88 mmHg - - - - -  07/16/12 1745 145/88 mmHg 98.2 F (36.8 C) Oral 77 18 95 %  07/16/12 1355 142/82 mmHg 98.5 F (36.9 C) Oral 76 18 93 %     Intake/Output Summary (Last 24 hours) at 07/17/12 0835 Last data filed at 07/16/12 2207  Gross per 24 hour  Intake    710 ml  Output   1200 ml  Net   -490 ml     Objective: Vital Signs: Blood pressure 155/92, pulse 78, temperature 98.3 F (36.8 C), temperature source Oral, resp. rate 20, weight 123 kg (271 lb 2.7 oz), SpO2 99.00%. Dg Femur Left  07/16/2012   *RADIOLOGY REPORT*  Clinical Data: Worsening pain  LEFT FEMUR - 2 VIEW  Comparison: 07/10/2012  Findings: The patient is status post total left hip arthroplasty. The hardware components are in anatomic alignment.  There is no evidence for dislocation.  Again noted is a comminuted fracture deformity involving the distal femoral metaphysis.  Side plate and screw fixation of the distal femur has been performed.  Stable appearance of the fracture fragments and hardware.  IMPRESSION:  1.  Stable appearance of plate and screw fixation of the comminuted distal femur fracture.   Original Report Authenticated By: Signa Kell, M.D.    Recent Labs  07/16/12 0455 07/17/12 0720  WBC 15.4* 15.2*  HGB 7.6* 7.7*  HCT 23.6* 24.2*  PLT 158 178    Recent Labs  07/16/12 0455 07/17/12 0720  NA 136 136  K 3.6 3.2*  CL 97 97  GLUCOSE 116* 94  BUN 77* 79*  CREATININE 3.39*  3.29*  CALCIUM 9.0 8.7   CBG (last 3)   Recent Labs  07/16/12 2353 07/17/12 0353 07/17/12 0755  GLUCAP 107* 95 96    Wt Readings from Last 3 Encounters:  07/16/12 123 kg (271 lb 2.7 oz)  07/13/12 122.471 kg (270 lb)  07/13/12 122.471 kg (270 lb)    Assessment/Plan: 1. Functional deficits secondary to distal left femur fx which require 3+ hours per day of interdisciplinary therapy in a comprehensive inpatient rehab setting. Physiatrist is providing close team supervision and 24 hour management of active medical problems listed below. Physiatrist and rehab team continue to assess barriers to discharge/monitor patient progress toward functional and medical goals. FIM: FIM - Bathing Bathing Steps Patient Completed: Chest;Right Arm;Left Arm;Abdomen;Front perineal area;Buttocks Bathing: 3: Mod-Patient completes 5-7 Chad [name] 10 parts or 50-74%  FIM - Upper Body Dressing/Undressing Upper body dressing/undressing: 0: Wears gown/pajamas-no public clothing FIM - Lower Body Dressing/Undressing Lower body dressing/undressing: 0: Wears Oceanographer        FIM - Banker Devices: Arm rests;Sliding board Bed/Chair Transfer: 4: Sit > Supine: Min A (steadying pt. > 75%/lift 1 leg);1: Two helpers  FIM - Locomotion: Wheelchair Distance: 45' Locomotion: Wheelchair: 1: Travels less than 50 ft with  minimal assistance (Pt.>75%) FIM - Locomotion: Ambulation Ambulation/Gait Assistance:  (unsafe to perform, pt unable to reach standing)  Comprehension Comprehension Mode: Auditory Comprehension: 5-Understands complex 90% of the time/Cues < 10% of the time  Expression Expression Mode: Verbal Expression: 5-Expresses complex 90% of the time/cues < 10% of the time  Social Interaction Social Interaction: 6-Interacts appropriately with others with medication or extra time (anti-anxiety, antidepressant).  Problem Solving Problem Solving:  5-Solves complex 90% of the time/cues < 10% of the time  Memory Memory: 5-Recognizes or recalls 90% of the time/requires cueing < 10% of the time  Medical Problem List and Plan:  1. Left distal femur fracture after fall. Status post ORIF 07/10/2012  2. DVT Prophylaxis/Anticoagulation: Subcutaneous Lovenox. Monitor platelet counts and any signs of bleeding. Check vascular study today 3. Pain Management: Oxycodone as needed. Monitor with increased mobility   -begin oxycontin for better pain control  -check femur xrays today 4. Neuropsych: This patient is capable of making decisions on his/her own behalf.  5. Acute blood loss anemia. Latest hemoglobin 7.5. Followup CBC and transfuse as appropriate. Continue Aranesp as advised  6. Chronic kidney disease and renal transplant 1996. Baseline creatinine 3.00. Followup per renal services. Continue prednisone/Sirolimus as directed  7. Hypertension/atrial fibrillation. Amiodarone 200 mg daily, coreg 25 mg twice a day, Lasix 80 mg twice a day, hydralazine 25 mg 3 times a day, M.D. or 60 mg daily. Monitor with increased mobility. Patient no chest pain or shortness of breath  8. Nonischemic cardiomyopathy. Continue aspirin therapy.  9. Systolic congestive heart failure. Continue Lasix as advised. Monitor for any signs of fluid overload  10. Non-insulin-dependent diabetes mellitus. Hemoglobin A1c 5.7. Blood sugars appear to be driven by prednisone. Continue sliding scale check blood sugars a.c. and at bedtime  11. History of gout. Continue allopurinol. Monitor for any gout flareups.  12. Hyperlipidemia. Lipitor  13. Obstructive sleep apnea. CPAP  14. Leukocytosis: no signs of cellulitis  -check urine  -IS  -likely steroid effect  LOS (Days) 2 A FACE TO FACE EVALUATION WAS PERFORMED  [name] 07/17/2012 8:32 AM

## 2012-07-17 NOTE — Progress Notes (Signed)
[facility] Rounding Note  ASSESSMENT/RECOMMENDATIONS: 57 yo AA male with history of hypertension, hepatitis C, chronic kidney disease with renal transplant 1996 with baseline creatinine around 3.5 (3.42 on 06/30/12)  and left hip replacement, admitted 07/10/2012 after fall landing on his left knee. X-rays and imaging revealed left intra-articular distal femur fracture. Underwent ORIF of left distal femur fracture 07/10/2012 by Dr. [name], with AKI on CKD post op..  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  We are adding little at this time so will sign off but please call if issues arise.    Subjective:  Complains of a lot up left leg pain since last night Participating in rehab Says endurance OK, just hurts Has company in with him now Up in the wheelchair  Objective Vital signs in last 24 hours: Filed Vitals:   07/16/12 1355 07/16/12 1745 07/16/12 2119 07/17/12 0543  BP: 142/82 145/88 155/88 155/92  Pulse: 76 77  78  Temp: 98.5 F (36.9 C) 98.2 F (36.8 C)  98.3 F (36.8 C)  TempSrc: Oral Oral  Oral  Resp: 18 18  20   Weight:      SpO2: 93% 95%  99%   Weight change:   Intake/Output Summary (Last 24 hours) at 07/17/12 1148 Last data filed at 07/17/12 0800  Gross per 24 hour  Intake    840 ml  Output   1625 ml  Net   -785 ml   Physical Exam: BP 155/92  Pulse 78   Temp(Src) 98.3 F (36.8 C) (Oral)  Resp 20  Wt 123 kg (271 lb 2.7 oz)  BMI 35.78 kg/m2  SpO2 99% NAD Lungs  clear  S1S2 no S3 Irreg Abdomen large no focal tenderness  Right renal allograft  Left leg in brace/ACE wraps Right leg trace edema  Labs: Basic Metabolic Panel:  Recent Labs Lab 07/11/12 0525 07/12/12 0440 07/13/12 0450 07/14/12 0350 07/15/12 0540 07/15/12 1703 07/16/12 0455 07/17/12 0720  NA 135 133* 134* 135 133*  --  136 136  K 3.3* 3.8 4.1 3.8 3.5  --  3.6 3.2*  CL 99 97 99 98 96  --  97 97  CO2 25 23 23 25 25   --  25 26  GLUCOSE 105* 135* 101* 123* 107*  --  116* 94  BUN 79* 82* 84* 88* 82*  --  77* 79*  CREATININE 3.83* 4.26* 4.06* 3.84* 3.60* 3.54* 3.39* 3.29*  CALCIUM 8.0* 8.2* 8.4 8.6 8.5  --  9.0 8.7  PHOS  --   --  6.4* 5.7* 4.8*  --   --  4.6   Liver Function Tests:  Recent Labs Lab 07/11/12 0525  07/15/12 0540 07/16/12 0455 07/17/12 0720  AST 21  --   --  65*  --   ALT 7  --   --  12  --   ALKPHOS 48  --   --  53  --   BILITOT 0.3  --   --  0.4  --   PROT 5.7*  --   --  6.4  --   ALBUMIN 2.6*  < > 2.2* 2.1* 2.1*  < > = values in this interval not displayed.  Recent Labs Lab 07/11/12 0525  07/15/12 0540 07/15/12 1703 07/16/12 0455 07/17/12 0720  WBC 18.2*  < > 13.4* 14.0* 15.4* 15.2*  NEUTROABS 13.6*  --   --   --  11.7*  --   HGB 8.3*  < > 7.5* 7.6* 7.6* 7.7*  HCT 26.3*  < > 23.6* 23.5* 23.6* 24.2*  MCV 89.8  < > 89.1 89.4 89.4 89.6  PLT 113*  < > 130* 142* 158 178  < > = values in this interval not displayed.  Recent Labs Lab 07/16/12 1759 07/16/12 1950 07/16/12 2353 07/17/12 0353 07/17/12 0755  GLUCAP 119* 127* 107* 95 96   No results found for this basename: IRON, TIBC, TRANSFERRIN, FERRITIN,  in the last 168 hours Studies/Results: Dg Femur Left  07/16/2012   *RADIOLOGY REPORT*  Clinical Data: Worsening pain  LEFT FEMUR - 2 VIEW  Comparison: 07/10/2012  Findings: The patient is status post total left hip  arthroplasty. The hardware components are in anatomic alignment.  There is no evidence for dislocation.  Again noted is a comminuted fracture deformity involving the distal femoral metaphysis.  Side plate and screw fixation of the distal femur has been performed.  Stable appearance of the fracture fragments and hardware.  IMPRESSION:  1.  Stable appearance of plate and screw fixation of the comminuted distal femur fracture.   Original Report Authenticated By: Signa Kell, M.D.   Medications:   . allopurinol  100 mg Oral BID  . amiodarone  200 mg Oral Daily  . aspirin EC  81 mg Oral Daily  . atorvastatin  20 mg Oral q1800  . carvedilol  25 mg Oral BID WC  . [START ON 07/19/2012] darbepoetin (ARANESP) injection - NON-DIALYSIS  100 mcg Subcutaneous Q Mon-1800  . docusate sodium  100 mg Oral BID  . enoxaparin (LOVENOX) injection  30 mg Subcutaneous Q24H  . furosemide  80 mg Oral BID  . hydrALAZINE  50 mg Oral TID  . insulin aspart  0-9 Units Subcutaneous TID AC & HS  . isosorbide mononitrate  60 mg Oral Daily  . OxyCODONE  10 mg Oral Q12H  . potassium chloride  10 mEq Oral Daily  . predniSONE  10 mg Oral Q breakfast  . senna  2 tablet Oral BID  . sirolimus  1.2 mg Oral Daily    I  have reviewed scheduled and prn medications.    Camille Bal, MD [facility] [phone] pager 07/17/2012, 11:48 AM

## 2012-07-17 NOTE — Progress Notes (Signed)
Occupational Therapy Session Note  Patient Details  Name: TYTON ABDALLAH MRN: [id number] Date of Birth: 1955-04-02  Today's Date: 07/17/2012 Time: 1430-1505 and 4098-1191 Time Calculation (min): 35 min and 65 =100 min total   Skilled Therapeutic Interventions/Progress Updates: AM session:  ADL in bed with focus on lateral leans for pericleansing and bed mobility.    PM session:  UE strengthening and w/c to bed transfer via sliding board with Min to CGA of one person in front and 2nd person behind to stabilize chair   Patient required extra time for both sessions due to c/o pain (8/10 even after medicated today)     Therapy Documentation Precautions:  Precautions Precautions: Fall Required Braces or Orthoses: Other Brace/Splint Other Brace/Splint: Bledsoe Brace Restrictions Weight Bearing Restrictions: Yes LLE Weight Bearing: Touchdown weight bearing  Pain: 8/10 L LE   See FIM for current functional status  Therapy/Group: Individual Therapy  Bud Face [facility] 07/17/2012, 4:36 PM

## 2012-07-18 ENCOUNTER — Inpatient Hospital Stay (HOSPITAL_COMMUNITY): Payer: Medicaid Other | Admitting: *Deleted

## 2012-07-18 DIAGNOSIS — I5023 Acute on chronic systolic (congestive) heart failure: Secondary | ICD-10-CM

## 2012-07-18 DIAGNOSIS — N184 Chronic kidney disease, stage 4 (severe): Secondary | ICD-10-CM

## 2012-07-18 LAB — CBC
MCV: 90.6 fL (ref 78.0–100.0)
Platelets: 207 10*3/uL (ref 150–400)
RBC: 2.77 MIL/uL — ABNORMAL LOW (ref 4.22–5.81)
WBC: 15.5 10*3/uL — ABNORMAL HIGH (ref 4.0–10.5)

## 2012-07-18 LAB — CULTURE, BLOOD (ROUTINE X 2): Culture: NO GROWTH

## 2012-07-18 LAB — RENAL FUNCTION PANEL
Albumin: 2.1 g/dL — ABNORMAL LOW (ref 3.5–5.2)
Calcium: 8.7 mg/dL (ref 8.4–10.5)
Creatinine, Ser: 3.31 mg/dL — ABNORMAL HIGH (ref 0.50–1.35)
GFR calc non Af Amer: 19 mL/min — ABNORMAL LOW (ref >90)
Glucose, Bld: 102 mg/dL — ABNORMAL HIGH (ref 70–99)
Sodium: 133 mEq/L — ABNORMAL LOW (ref 135–145)

## 2012-07-18 LAB — GLUCOSE, CAPILLARY: Glucose-Capillary: 105 mg/dL — ABNORMAL HIGH (ref 70–99)

## 2012-07-18 MED ORDER — POTASSIUM CHLORIDE CRYS ER 20 MEQ PO TBCR
20.0000 meq | EXTENDED_RELEASE_TABLET | Freq: Every day | ORAL | Status: DC
  Administered 2012-07-19 – 2012-08-05 (×18): 20 meq via ORAL
  Filled 2012-07-18 (×21): qty 1

## 2012-07-18 MED ORDER — POTASSIUM CHLORIDE CRYS ER 20 MEQ PO TBCR
20.0000 meq | EXTENDED_RELEASE_TABLET | Freq: Two times a day (BID) | ORAL | Status: AC
  Administered 2012-07-18 (×2): 20 meq via ORAL
  Filled 2012-07-18 (×3): qty 1

## 2012-07-18 NOTE — Progress Notes (Signed)
Floor notified today concerning completion of the venous Doppler. Was told by the RN that she had been told in report to hold until tomorrow 07/19/2012. Will attempt to do patient tomorrow

## 2012-07-18 NOTE — Progress Notes (Signed)
Patient ID: Chad [name], male   DOB: 27-May-1955, 57 y.o.   MRN: [id number]  Patient ID: Chad [name], male   DOB: 1955/07/15, 57 y.o.   MRN: [id number] Subjective/Complaints:  6/22.  57 y/o with L distal femur fracture.  H/O sepsis, ARDS, systolic HF, ARF and HTN.  Doing well. Cough has resolved; ggod night.  WBC still elevated. Potassium 3.0  Exam-   Gen- comfortable, mild cough HEENT- n/c O2 in place; low hanging soft palate Chest- decr BS but clear CV- regular Abd- benign, no distension; obese Extr-  No edema ; L leg casted  Patient Vitals for the past 24 hrs:  BP Temp Temp src Pulse Resp SpO2  07/18/12 0457 149/85 mmHg 98.3 F (36.8 C) Oral 75 18 95 %  07/17/12 1539 149/78 mmHg 99.2 F (37.3 C) Oral 76 18 96 %  07/17/12 1417 146/88 mmHg - - - - -     Intake/Output Summary (Last 24 hours) at 07/18/12 0752 Last data filed at 07/18/12 0457  Gross per 24 hour  Intake    840 ml  Output   1925 ml  Net  -1085 ml     Objective: Vital Signs: Blood pressure 149/85, pulse 75, temperature 98.3 F (36.8 C), temperature source Oral, resp. rate 18, weight 123 kg (271 lb 2.7 oz), SpO2 95.00%. Dg Femur Left  07/16/2012   *RADIOLOGY REPORT*  Clinical Data: Worsening pain  LEFT FEMUR - 2 VIEW  Comparison: 07/10/2012  Findings: The patient is status post total left hip arthroplasty. The hardware components are in anatomic alignment.  There is no evidence for dislocation.  Again noted is a comminuted fracture deformity involving the distal femoral metaphysis.  Side plate and screw fixation of the distal femur has been performed.  Stable appearance of the fracture fragments and hardware.  IMPRESSION:  1.  Stable appearance of plate and screw fixation of the comminuted distal femur fracture.   Original Report Authenticated By: Signa Kell, M.D.    Recent Labs  07/17/12 0720 07/18/12 0556  WBC 15.2* 15.5*  HGB 7.7* 7.9*  HCT 24.2* 25.1*  PLT 178 207    Recent Labs   07/17/12 0720 07/18/12 0555  NA 136 133*  K 3.2* 3.0*  CL 97 93*  GLUCOSE 94 102*  BUN 79* 83*  CREATININE 3.29* 3.31*  CALCIUM 8.7 8.7   CBG (last 3)   Recent Labs  07/17/12 1159 07/17/12 1654 07/17/12 2032  GLUCAP 120* 147* 162*    Wt Readings from Last 3 Encounters:  07/16/12 123 kg (271 lb 2.7 oz)  07/13/12 122.471 kg (270 lb)  07/13/12 122.471 kg (270 lb)   BP Readings from Last 3 Encounters:  07/18/12 149/85  07/15/12 130/72  07/15/12 130/72    Assessment/Plan: 1. Functional deficits secondary to distal left femur fx which require 3+ hours per day of interdisciplinary therapy in a comprehensive inpatient rehab setting. Physiatrist is providing close team supervision and 24 hour management of active medical problems listed below. Physiatrist and rehab team continue to assess barriers to discharge/monitor patient progress toward functional and medical goals. 2.  Hypokalemia- will supplement 3.  HTN stable FIM: FIM - Bathing Bathing Steps Patient Completed: Chest;Right Arm;Left Arm;Abdomen;Front perineal area;Right upper leg Bathing: 3: Mod-Patient completes 5-7 35f 10 parts or 50-74%  FIM - Upper Body Dressing/Undressing Upper body dressing/undressing: 0: Activity did not occur FIM - Lower Body Dressing/Undressing Lower body dressing/undressing: 0: Wears gown/pajamas-no public clothing  FIM - Toileting Toileting:  0: Activity did not occur  FIM - Archivist Transfers: 0-Activity did not occur  FIM - Banker Devices: Bed rails;Arm rests;Sliding board;HOB elevated Bed/Chair Transfer: 4: Supine > Sit: Min A (steadying Pt. > 75%/lift 1 leg);1: Two helpers (mod A transfer; second person to stabilize w/c)  FIM - Locomotion: Wheelchair Distance: 45' Locomotion: Wheelchair: 5: Travels 150 ft or more: maneuvers on rugs and over door sills with supervision, cueing or coaxing FIM - Locomotion:  Ambulation Ambulation/Gait Assistance:  (unsafe to perform, pt unable to reach standing)  Comprehension Comprehension Mode: Auditory Comprehension: 5-Understands complex 90% of the time/Cues < 10% of the time  Expression Expression Mode: Verbal Expression: 7-Expresses complex ideas: With no assist  Social Interaction Social Interaction: 6-Interacts appropriately with others with medication or extra time (anti-anxiety, antidepressant).  Problem Solving Problem Solving: 7-Solves complex problems: Recognizes & self-corrects  Memory Memory: 7-Complete Independence: No helper  Medical Problem List and Plan:  1. Left distal femur fracture after fall. Status post ORIF 07/10/2012  2. DVT Prophylaxis/Anticoagulation: Subcutaneous Lovenox. Monitor platelet counts and any signs of bleeding. Check vascular study today 3. Pain Management: Oxycodone as needed. Monitor with increased mobility   -begin oxycontin for better pain control  -check femur xrays today 4. Neuropsych: This patient is capable of making decisions on his/her own behalf.  5. Acute blood loss anemia. Latest hemoglobin 7.5. Followup CBC and transfuse as appropriate. Continue Aranesp as advised  6. Chronic kidney disease and renal transplant 1996. Baseline creatinine 3.00. Followup per renal services. Continue prednisone/Sirolimus as directed  7. Hypertension/atrial fibrillation. Amiodarone 200 mg daily, coreg 25 mg twice a day, Lasix 80 mg twice a day, hydralazine 25 mg 3 times a day, M.D. or 60 mg daily. Monitor with increased mobility. Patient no chest pain or shortness of breath  8. Nonischemic cardiomyopathy. Continue aspirin therapy.  9. Systolic congestive heart failure. Continue Lasix as advised. Monitor for any signs of fluid overload  10. Non-insulin-dependent diabetes mellitus. Hemoglobin A1c 5.7. Blood sugars appear to be driven by prednisone. Continue sliding scale check blood sugars a.c. and at bedtime  11. History of  gout. Continue allopurinol. Monitor for any gout flareups.  12. Hyperlipidemia. Lipitor  13. Obstructive sleep apnea. CPAP  14. Leukocytosis: no signs of cellulitis  -check urine  -IS  -likely steroid effect  LOS (Days) 3 A FACE TO FACE EVALUATION WAS PERFORMED  [name] 07/18/2012 7:52 AM

## 2012-07-18 NOTE — Progress Notes (Signed)
Physical Therapy Note  Patient Details  Name: ELIA KEENUM MRN: [id number] Date of Birth: [birth year] Today's Date: 07/18/2012  1345-1430 (45 minutes) individual Pain: 8/10 left LE; premedicated Other: Oxygen sats 88-90% 3 L  during session Focus of treatment: bed mobility training; transfer training; wc mobility training Treatment: Pt in bed upon arrival; supine to sit using bedrail min/mod assist LT LE/ scooting forward; transfer assist with wc setup and sliding board and min assist scoot to left; wc mobility 120 feet X 2 SBA    Costantino Kohlbeck,JIM 07/18/2012, 3:01 PM

## 2012-07-19 ENCOUNTER — Other Ambulatory Visit: Payer: Self-pay | Admitting: *Deleted

## 2012-07-19 ENCOUNTER — Inpatient Hospital Stay (HOSPITAL_COMMUNITY): Payer: Medicaid Other

## 2012-07-19 ENCOUNTER — Inpatient Hospital Stay (HOSPITAL_COMMUNITY): Payer: Medicaid Other | Admitting: *Deleted

## 2012-07-19 ENCOUNTER — Inpatient Hospital Stay (HOSPITAL_COMMUNITY): Payer: Self-pay | Admitting: Physical Therapy

## 2012-07-19 DIAGNOSIS — Z01810 Encounter for preprocedural cardiovascular examination: Secondary | ICD-10-CM

## 2012-07-19 DIAGNOSIS — N184 Chronic kidney disease, stage 4 (severe): Secondary | ICD-10-CM

## 2012-07-19 LAB — GLUCOSE, CAPILLARY: Glucose-Capillary: 103 mg/dL — ABNORMAL HIGH (ref 70–99)

## 2012-07-19 MED ORDER — OXYCODONE HCL ER 10 MG PO T12A
20.0000 mg | EXTENDED_RELEASE_TABLET | Freq: Two times a day (BID) | ORAL | Status: DC
  Administered 2012-07-19 (×2): 20 mg via ORAL
  Filled 2012-07-19 (×2): qty 2

## 2012-07-19 NOTE — Plan of Care (Signed)
Problem: RH SAFETY Goal: RH STG ADHERE TO SAFETY PRECAUTIONS W/ASSISTANCE/DEVICE STG Adhere to Safety Precautions With supervision  Outcome: Progressing Slideboard transfer with supervision.

## 2012-07-19 NOTE — Progress Notes (Signed)
Patient refused to wear cpap tonight. 

## 2012-07-19 NOTE — Plan of Care (Signed)
Problem: RH BOWEL ELIMINATION Goal: RH STG MANAGE BOWEL WITH ASSISTANCE STG Manage Bowel with Modified independent  Outcome: Progressing No incontinent episode reported.

## 2012-07-19 NOTE — Progress Notes (Signed)
Physical Therapy Session Note  Patient Details  Name: Chad [name] MRN: [id number] Date of Birth: [birth year]  Today's Date: 07/19/2012 Time: 4098-1191 Time Calculation (min): 25 min  Short Term Goals: Week 1:  PT Short Term Goal 1 (Week 1): Pt will perform sliding board transfers to level surfaces with min assist.  PT Short Term Goal 2 (Week 1): Pt will perform sit <> stand from elevated mat with +2 total assist. PT Short Term Goal 3 (Week 1): Pt will propel wheelchair x 100' with supervision.  Skilled Therapeutic Interventions/Progress Updates:    Pt in a lot of pain due to sitting up from first PT session this morning until OT session this afternoon despite PT recommendation to get back in bed at 10:30am. Increased Lt. LE swelling as a result. Spoke with PA who reports pt OK to get out of bed if he can tolerate it. PT in too much pain upon entry but agreeable to exercises in bed. Bil. LE therex in supine: Lt. LE ankle pumps (2 x 10 reps) and small AA straight leg raises (2 x 5 reps); Rt. LE: straight leg raises (2 x 15 reps), hip abducation/adduction (2 x 10 reps), manually resisted knee/hip flexion/extension (2 x 10 reps); Glute squeezes (3 x 10; 5 sec holds). Rest breaks needed for relief. Cues for breathing with exercise.   Therapy Documentation Precautions:  Precautions Precautions: Fall Required Braces or Orthoses: Other Brace/Splint Other Brace/Splint: Bledsoe Brace Restrictions Weight Bearing Restrictions: Yes LLE Weight Bearing: Touchdown weight bearing Pain: Pain Assessment Pain Score: 10-Worst pain ever Pain Type: Surgical pain Pain Location: Leg Pain Orientation: Left Pain Descriptors / Indicators: Aching Pain Onset: On-going Pain Intervention(s):  (pt already had ice on, premedicated)  See FIM for current functional status  Therapy/Group: Individual Therapy  [name] 07/19/2012, 3:16 PM

## 2012-07-19 NOTE — Progress Notes (Signed)
Physical Therapy Session Note  Patient Details  Name: Chad [name] MRN: [id number] Date of Birth: 12-07-1955  Today's Date: 07/19/2012 Time: 2841-3244 Time Calculation (min): 55 min  Short Term Goals: Week 1:  PT Short Term Goal 1 (Week 1): Pt will perform sliding board transfers to level surfaces with min assist.  PT Short Term Goal 2 (Week 1): Pt will perform sit <> stand from elevated mat with +2 total assist. PT Short Term Goal 3 (Week 1): Pt will propel wheelchair x 100' with supervision.  Skilled Therapeutic Interventions/Progress Updates:    Pain limiting pt today. Supine to sit min assist for Lt. LE, Pt performs with multiple rest breaks due to pain . Sliding board transfer working on pt pt placing and removing board, anterior weight shift during transfer, and appropriate placement of hands, min assist overall for board placement and transfer. Pt able to progress across board with only verbal cues but needs assist for Lt. LE. Wheelchair mobility x 120' with supervision rest breaks needed due to decreased endurance. Sliding board level transfer x 2 wheelchair <> mat with min assist for Lt. LE. Sit <> stands x 2 with +2 assist working on prestand mechanics and anterior translation of trunk into standing. First attempt pt unable to fully reach standing however was successful second attempt and maintained standing x 30 sec with Lt. LE in NWB position for pain modulation.    Therapy Documentation Precautions:  Precautions Precautions: Fall Required Braces or Orthoses: Other Brace/Splint Other Brace/Splint: Bledsoe Brace Restrictions Weight Bearing Restrictions: Yes LLE Weight Bearing: Touchdown weight bearing Pain: Pain Assessment Pain Score: 10-Worst pain ever Pain Type: Surgical pain Pain Location: Leg Pain Orientation: Left Pain Descriptors / Indicators: Aching Pain Onset: On-going Pain Intervention(s): RN made aware;Repositioned  See FIM for current functional  status  Therapy/Group: Individual Therapy  [name] 07/19/2012, 12:09 PM

## 2012-07-19 NOTE — Evaluation (Signed)
Recreational Therapy Assessment and Plan  Patient Details  Name: Chad [name] MRN: [id number] Date of Birth: Oct 31, 1955 Today's Date: 07/19/2012  Rehab Potential: Good ELOS: 2 weeks   Assessment Clinical Impression: Problem List:  Patient Active Problem List    Diagnosis  Date Noted   .  Femur fracture, left  07/16/2012   .  Fracture, femur, distal  07/10/2012   .  Diarrhea  03/16/2012   .  Nausea and vomiting  03/13/2012   .  Hx of amiodarone therapy  03/13/2012   .  Steroid long-term use  03/13/2012   .  Pulmonary hypertension  03/13/2012   .  CHF (congestive heart failure)  12/08/2011   .  Community acquired pneumonia  12/08/2011   .  PAF (paroxysmal atrial fibrillation)  12/08/2011   .  History of renal transplant  12/08/2011   .  HTN (hypertension)  12/08/2011   .  Respiratory failure with hypoxia  12/08/2011   .  CKD (chronic kidney disease)  11/18/2011   .  Hypertension  11/18/2011   .  OSA (obstructive sleep apnea)  11/13/2011   .  Chronic systolic CHF (congestive heart failure)  10/22/2011   .  Acute on chronic systolic heart failure  10/03/2011   .  Cellulitis of leg, left  08/27/2011   .  Atrial flutter  08/27/2011   .  Ulcer of left lower leg  08/27/2011   .  Conjunctivitis, acute, left eye  08/27/2011   .  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  Septic shock  06/10/2011   .  Acute renal failure  06/10/2011   .  Cardiomyopathy  06/10/2011   .  Acute systolic heart failure  06/09/2011   .  Renal transplant disorder  06/05/2011   .  HCAP (healthcare-associated pneumonia)  06/04/2011   .  Thrombocytopenia  06/04/2011   .  Acute respiratory failure  06/04/2011   .  ARDS (adult respiratory distress syndrome)  06/04/2011    Past Medical History:  Past Medical History   Diagnosis  Date   .  Hypertension    .  Hepatitis C  1996   .  CKD (chronic kidney disease), stage IV      a. s/p renal transplant 1996.   Marland Kitchen  Pneumonia    .  HLD (hyperlipidemia)    .   Nonischemic cardiomyopathy      a. unknown etiology, EF 30-35% by echo 09/2011; b. 09/2011 Lexi MV EF 42%, no ischemia/infarct.   .  Systolic CHF    .  Atrial flutter      a. in the setting of sepsis 05/2011; on amio; no anticoagulation    Past Surgical History:  Past Surgical History   Procedure  Laterality  Date   .  Nephrectomy transplanted organ     .  Insertion of dialysis catheter   06/20/2011     Procedure: INSERTION OF DIALYSIS CATHETER; Surgeon: [name], MD; Location: [facility] OR; Service: Vascular; Laterality: Right; Ultrasound guided insertion of right internal jugular dialysis catheter   .  Multiple extractions with alveoloplasty   06/27/2011     Procedure: MULTIPLE EXTRACION WITH ALVEOLOPLASTY; Surgeon: [name], DDS; Location: [facility] OR; Service: Oral Surgery; Laterality: N/A; Extraction of tooth # 14 with alveoloplasty   .  Hip surgery     .  Joint replacement       left hip   .  Femur fracture surgery  Left  07/09/2012     Dr Deno Etienne   .  Femur im nail  Left  07/10/2012     Procedure: IM FEMORAL NAIL; Surgeon: Toni Arthurs, MD; Location: [facility] OR; Service: Orthopedics; Laterality: Left;    Assessment & Plan  Clinical Impression: Chad [name] is a 57 y.o. right-handed male with history of hypertension, atrial fibrillation maintained on amiodarone, hepatitis C, chronic kidney disease with renal transplant 1996 with baseline creatinine around 3.00 and left hip replacement. Patient well known to inpatient rehabilitation services from admission 06/27/2011 for critical illness myopathy which demanded tracheostomy patient was decannulated discharge to home doing well. Patient independent prior to admission living alone. Admitted 07/10/2012 after fall landing on his left knee. X-rays and imaging revealed left intra-articular distal femur fracture. Underwent ORIF of left distal femur fracture 07/10/2012 per Dr. [name]. Advised nonweightbearing left lower extremity with knee immobilizer  restrictions. Maintained on subcutaneous Lovenox for DVT prophylaxis. Postoperative anemia 8.3 and has been transfused. Renal function remains stable with latest creatinine 3.60 followed by renal services advised to continue prednisone as well as sirolimus. Patient transferred to CIR on 07/15/2012.   Pt presents with decreased activity tolerance, decreased functional mobility, decreased balance, difficulty maintaining precautions, increased pain Limiting pt's independence with leisure/community pursuits.  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;Community - Travel (Comment);Community Recruitment consultant (Comment) Other Leisure Interests: Television;Reading;Computer Leisure Participation Style: With Family/Friends Awareness of Community Resources: Excellent Psychosocial / Spiritual Spiritual Interests: Church;Womens'Men's Groups Stress Management: Good Social interaction - Mood/Behavior: Cooperative Film/video editor for Education?: Yes Recreational Therapy Orientation Orientation -Reviewed with patient: Available activity resources Strengths/Weaknesses Patient Strengths/Abilities: Willingness to participate;Active premorbidly Patient weaknesses: Physical limitations  Plan Rec Therapy Plan Is patient appropriate for Therapeutic Recreation?: Yes Rehab Potential: Good Treatment times per week: Min 2 times per week >20 minutes Estimated Length of Stay: 2 weeks TR Treatment/Interventions: Adaptive equipment instruction;1:1 session;Balance/vestibular training;Functional mobility training;Community reintegration;Patient/family education;Recreation/leisure participation;Therapeutic activities;Therapeutic exercise;Wheelchair propulsion/positioning;UE/LE Coordination activities Recommendations for other services: Neuropsych  Recommendations for other services: None  Discharge Criteria: Patient  will be discharged from TR if patient refuses treatment 3 consecutive times without medical reason.  If treatment goals not met, if there is a change in medical status, if patient makes no progress towards goals or if patient is discharged from hospital.  The above assessment, treatment plan, treatment alternatives and goals were discussed and mutually agreed upon: by patient  Zahara Rembert 07/19/2012, 11:53 AM

## 2012-07-19 NOTE — Progress Notes (Signed)
Subjective/Complaints: Pain still an issue. Weekend uneventful A 12 point review of systems has been performed and if not noted above is otherwise negative.   Objective: Vital Signs: Blood pressure 140/85, pulse 77, temperature 98.1 F (36.7 C), temperature source Oral, resp. rate 18, weight 123 kg (271 lb 2.7 oz), SpO2 96.00%. No results found.  Recent Labs  07/17/12 0720 07/18/12 0556  WBC 15.2* 15.5*  HGB 7.7* 7.9*  HCT 24.2* 25.1*  PLT 178 207    Recent Labs  07/17/12 0720 07/18/12 0555  NA 136 133*  K 3.2* 3.0*  CL 97 93*  GLUCOSE 94 102*  BUN 79* 83*  CREATININE 3.29* 3.31*  CALCIUM 8.7 8.7   CBG (last 3)   Recent Labs  07/18/12 1155 07/18/12 1621 07/18/12 2010  GLUCAP 119* 131* 145*    Wt Readings from Last 3 Encounters:  07/16/12 123 kg (271 lb 2.7 oz)  07/13/12 122.471 kg (270 lb)  07/13/12 122.471 kg (270 lb)    Physical Exam:  Constitutional: He is oriented to person, place, and time.  HENT:  Head: Normocephalic.  Eyes: EOM are normal.  Neck: Neck supple. No thyromegaly present.  Cardiovascular:  Cardiac rate controlled  Pulmonary/Chest:  Decreased breath sounds but clear to auscultation  Abdominal: Soft. Bowel sounds are normal. He exhibits no distension.  Neurological: He is alert and oriented to person, place, and time.  Patient follows full commands. Motor function within normal limits in UE's and RLE. Sensory function nl  Skin:  Surgical site is dressed. Left leg in KI/ appropriately tender  Psychiatric: he anxious but otherwise His behavior is normal. Judgment and thought content normal  Neuro:  Eyes without evidence of nystagmus  Tone is normal without evidence of spasticity  Cerebellar exam shows no evidence of ataxia on finger nose finger or heel to shin testing  No evidence of trunkal ataxia  Motor strength is 5/5 in bilateral deltoid, biceps, triceps, finger flexors and extensors, wrist flexors and extensors, hip flexors,  knee flexors and extensors, ankle dorsiflexors, plantar flexors, invertors and evertors, toe flexors and extensors  Sensory exam is normal to pinprick, proprioception and light touch in the upper and lower limbs    Assessment/Plan: 1. Functional deficits secondary to distal left femur fx which require 3+ hours per day of interdisciplinary therapy in a comprehensive inpatient rehab setting. Physiatrist is providing close team supervision and 24 hour management of active medical problems listed below. Physiatrist and rehab team continue to assess barriers to discharge/monitor patient progress toward functional and medical goals. FIM: FIM - Bathing Bathing Steps Patient Completed: Chest;Right Arm;Left Arm;Abdomen;Front perineal area;Right upper leg Bathing: 3: Mod-Patient completes 5-7 110f 10 parts or 50-74%  FIM - Upper Body Dressing/Undressing Upper body dressing/undressing: 0: Activity did not occur FIM - Lower Body Dressing/Undressing Lower body dressing/undressing: 0: Wears gown/pajamas-no public clothing  FIM - Toileting Toileting: 0: Activity did not occur  FIM - Archivist Transfers: 0-Activity did not occur  FIM - Banker Devices: Bed rails;Arm rests;Sliding board;HOB elevated Bed/Chair Transfer: 4: Supine > Sit: Min A (steadying Pt. > 75%/lift 1 leg);1: Two helpers (mod A transfer; second person to stabilize w/c)  FIM - Locomotion: Wheelchair Distance: 45' Locomotion: Wheelchair: 5: Travels 150 ft or more: maneuvers on rugs and over door sills with supervision, cueing or coaxing FIM - Locomotion: Ambulation Ambulation/Gait Assistance:  (unsafe to perform, pt unable to reach standing)  Comprehension Comprehension Mode: Auditory Comprehension: 5-Understands complex 90%  of the time/Cues < 10% of the time  Expression Expression Mode: Verbal Expression: 7-Expresses complex ideas: With no assist  Social Interaction Social  Interaction: 6-Interacts appropriately with others with medication or extra time (anti-anxiety, antidepressant).  Problem Solving Problem Solving: 7-Solves complex problems: Recognizes & self-corrects  Memory Memory: 7-Complete Independence: No helper  Medical Problem List and Plan:  1. Left distal femur fracture after fall. Status post ORIF 07/10/2012  2. DVT Prophylaxis/Anticoagulation: Subcutaneous Lovenox. Monitor platelet counts and any signs of bleeding. Check vascular study today 3. Pain Management: Oxycodone as needed. Monitor with increased mobility   -increase oxy cr to 20mg  q12  -femur xr stable 4. Neuropsych: This patient is capable of making decisions on his/her own behalf.  5. Acute blood loss anemia. Latest hemoglobin 7.5. Followup CBC and transfuse as appropriate. Continue Aranesp as advised  6. Chronic kidney disease and renal transplant 1996. Baseline creatinine 3.00. Followup per renal services. Continue prednisone/Sirolimus as directed  7. Hypertension/atrial fibrillation. Amiodarone 200 mg daily, coreg 25 mg twice a day, Lasix 80 mg twice a day, hydralazine 25 mg 3 times a day, M.D. or 60 mg daily. Monitor with increased mobility. Patient no chest pain or shortness of breath  8. Nonischemic cardiomyopathy. Continue aspirin therapy.  9. Systolic congestive heart failure. Continue Lasix as advised. Monitor for any signs of fluid overload  10. Non-insulin-dependent diabetes mellitus. Hemoglobin A1c 5.7. Blood sugars appear to be driven by prednisone. Continue sliding scale check blood sugars a.c. and at bedtime  11. History of gout. Continue allopurinol. Monitor for any gout flareups.  12. Hyperlipidemia. Lipitor  13. Obstructive sleep apnea. CPAP  14. Leukocytosis: no signs of cellulitis  -urine clean  -IS  -likely steroid effect  LOS (Days) 4 A FACE TO FACE EVALUATION WAS PERFORMED  Cortnie Ringel T 07/19/2012 7:39 AM

## 2012-07-19 NOTE — Progress Notes (Signed)
Social Work  Social Work Assessment and Plan  Patient Details  Name: Chad [name] MRN: [id number] Date of Birth: 09/09/1955  Today's Date: 07/19/2012  Problem List:  Patient Active Problem List   Diagnosis Date Noted  . Femur fracture, left 07/16/2012  . Fracture, femur, distal 07/10/2012  . Diarrhea 03/16/2012  . Nausea and vomiting 03/13/2012  . Hx of amiodarone therapy 03/13/2012  . Steroid long-term use 03/13/2012  . Pulmonary hypertension 03/13/2012  . CHF (congestive heart failure) 12/08/2011  . Community acquired pneumonia 12/08/2011  . PAF (paroxysmal atrial fibrillation) 12/08/2011  . History of renal transplant 12/08/2011  . HTN (hypertension) 12/08/2011  . Respiratory failure with hypoxia 12/08/2011  . CKD (chronic kidney disease) 11/18/2011  . Hypertension 11/18/2011  . OSA (obstructive sleep apnea) 11/13/2011  . Chronic systolic CHF (congestive heart failure) 10/22/2011  . Acute on chronic systolic heart failure 10/03/2011  . Cellulitis of leg, left 08/27/2011  . Atrial flutter 08/27/2011  . Ulcer of left lower leg 08/27/2011  . Conjunctivitis, acute, left eye 08/27/2011  . 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 Septic shock 06/10/2011  . Acute renal failure 06/10/2011  . Cardiomyopathy 06/10/2011  . Acute systolic heart failure 06/09/2011  . Renal transplant disorder 06/05/2011  . HCAP (healthcare-associated pneumonia) 06/04/2011  . Thrombocytopenia 06/04/2011  . Acute respiratory failure 06/04/2011  . ARDS (adult respiratory distress syndrome) 06/04/2011   Past Medical History:  Past Medical History  Diagnosis Date  . Hypertension   . Hepatitis C 1996  . CKD (chronic kidney disease), stage IV     a. s/p renal transplant 1996.  Marland Kitchen Pneumonia   . HLD (hyperlipidemia)   . Nonischemic cardiomyopathy     a. unknown etiology, EF 30-35% by echo 09/2011;  b. 09/2011 Lexi MV EF 42%, no ischemia/infarct.  . Systolic CHF   . Atrial flutter     a. in the  setting of sepsis 05/2011; on amio; no anticoagulation   Past Surgical History:  Past Surgical History  Procedure Laterality Date  . Nephrectomy transplanted organ    . Insertion of dialysis catheter  06/20/2011    Procedure: INSERTION OF DIALYSIS CATHETER;  Surgeon: [name], MD;  Location: [facility] OR;  Service: Vascular;  Laterality: Right;  Ultrasound guided insertion of right internal jugular dialysis catheter  . Multiple extractions with alveoloplasty  06/27/2011    Procedure: MULTIPLE EXTRACION WITH ALVEOLOPLASTY;  Surgeon: [name], DDS;  Location: [facility] OR;  Service: Oral Surgery;  Laterality: N/A;  Extraction  of tooth # 14 with alveoloplasty  . Hip surgery    . Joint replacement      left hip  . Femur fracture surgery Left 07/09/2012    Dr Deno Etienne  . Femur im nail Left 07/10/2012    Procedure: IM FEMORAL NAIL;  Surgeon: Toni Arthurs, MD;  Location: [facility] OR;  Service: Orthopedics;  Laterality: Left;   Social History:  reports that he has never smoked. He has never used smokeless tobacco. He reports that he does not drink alcohol or use illicit drugs.  Family / Support Systems Marital Status: Single Patient Roles: Parent;Partner Linden Dolin is mother of his son and significant other at tim ) Spouse/Significant Other: partner, Linden Dolin ([phone]) [phone] Children: son, Shariq Puig @ (C) [phone] Other Supports: sister, Selena Batten Anticipated Caregiver: Audery Amel, stepsister in [place] to stay a week; otherwise he is arranging friends to help;  Notes "Ms Toney Reil" will be staying with him (she had cared for  his mother before her death), however, she is elderly and can really only provide light assistance with bathing, dressing and home management/ meals/ etc. Ability/Limitations of Caregiver: min assist to supervision Caregiver Availability: Other (Comment) (Can help for one week) Family Dynamics: Pt describes good relationships with all family, however, most are limited in how much assist they can  provide due to work demands.  Social History Preferred language: English Religion: Methodist Cultural Background: NA Education: [employer] grad Read: Yes Write: Yes Employment Status: Disabled Date Retired/Disabled/Unemployed: since hospitalization in May 2013 - has made "attempts" to try and return to work, however, "I just seem to get sick again..." Legal Hisotry/Current Legal Issues: none Guardian/Conservator: notes girlfriend, Dennie Bible is his POA   Abuse/Neglect Physical Abuse: Denies Verbal Abuse: Denies Sexual Abuse: Denies Exploitation of patient/patient's resources: Denies Self-Neglect: Denies  Emotional Status Pt's affect, behavior adn adjustment status: Pt very pleasant and talkative. Familiar to me from his CIR stay last year.  Appears slightly more depressed about his overall situation this time as he reports multiple rehospitalizations over this year. Now frustrated with this fall and injury which has limited him yet again.  BDI screen = 1.  Reports he is concerned about his finances  as he has lost his private insurance and is awaiting decision about SSD app still.  Remains very complimentary of staff and one can tell he is trying to be as positive as he can. Recent Psychosocial Issues: Multiple rehospitalizations this past year.  Financial strains with loss of insurance and still awaiting SSD decision.  Luckily, he is still able to draw LTD from employer Pyschiatric History: None Substance Abuse History: None  Patient / Family Perceptions, Expectations & Goals Pt/Family understanding of illness & functional limitations: Pt with good understanding of his injury and his NWB restriction Premorbid pt/family roles/activities: had been independent at home alone until this fall.  Continues with poor endurance for activity but independent Anticipated changes in roles/activities/participation: Will likely once again need assistance from family and private caregiver at d/c.  GF and family to  provide caregiver assist as able. Pt/family expectations/goals: Pt very hopeful he can rebuild his strength to the point of function that Ms. Toney Reil can manage.    Community [employer] Agencies: None Premorbid Home Care/DME Agencies: Other (Comment) [facility] after 2013 CIR) Transportation available at discharge: yes Resource referrals recommended: Psychology  Discharge Planning Living Arrangements: Alone Support Systems: Spouse/significant other;Children;Friends/neighbors Type of Residence: Private residence Insurance Resources: Customer service manager Resources: Other (Comment) (LTD via [employer]) Financial Screen Referred: Previously completed Living Expenses: Motgage Money Management: Patient Do you have any problems obtaining your medications?: Yes (Describe) (no insurance) Home Management: pt (PTA) Patient/Family Preliminary Plans: Pt hopes to be able to return to his own home with family friend, "Ms. Daisy" to stay and assist Barriers to Discharge: Family Support Social Work Anticipated Follow Up Needs: HH/OP DC Planning Additional Notes/Comments: Ms. Toney Reil is "in her 57's" per pt but in good health overall.  He envisions that she could assist him with bathing and dressing, fixing meals and general home upkeep.  He does not feel she could provide heavy physcial assist and this may prove to be the barrier in his return home. Expected length of stay: ELOS 2 weeks  Clinical Impression Very pleasant gentleman who is familiar to CIR staff from previous stay in May 2013.  Slightly more discouraged with this stay as he reports multiple readmissions over past year in unable to return to his job as he  had hoped.  Frustration with fall/ injury as well.  Relatively good family support, however, from family, their assist is limited to "a couple of weeks" with his longer range support being "Ms Toney Reil" who is an elderly family friend and can only provide light assist and home upkeep.  Concern if pt  will reach a functional level that this designated caregiver can provide.  Will continue to follow for d/c planning issues and support.  Jacub Waiters 07/19/2012, 7:22 AM

## 2012-07-19 NOTE — Progress Notes (Signed)
Occupational Therapy Session Note  Patient Details  Name: Chad [name] MRN: [id number] Date of Birth: [birth year]  Today's Date: 07/19/2012 Time: 0700-0759 Time Calculation (min): 59 min  Short Term Goals: Week 1:  OT Short Term Goal 1 (Week 1): LB Bath:  Mod assist sitting EOB to include lateral leans and use of AE OT Short Term Goal 2 (Week 1): LB Dressing:  Mod assist to donn pants/shorts sitting EOB to include lateral leans and use of AE OT Short Term Goal 3 (Week 1): Toilet Transfer: Mod Assist for drop arm commode transfers using slide board PRN OT Short Term Goal 4 (Week 1): Toileting: Mod assist with pants down and up using lateral leans OT Short Term Goal 5 (Week 1): BUE strengthening:  Supervision performing HEP  Skilled Therapeutic Interventions/Progress Updates:    Pt in bed upon arrival but requested to use BSC.  Pt performed sliding board transfer to drop-arm BSC with min A (+2 for safety to steady BSC). Pt completed grooming and UB bathing and dressing while seated on BSC.  Pt performed lateral leans to facilitate the nurse tech in performing toilet hygiene.  Pt required assistance with positioning RLE while leaning.  Pt transferred back to EOB and threaded pants while seated EOB.  Pt required assistance managing/positioning RLE when performing sit->supine.  Once supine pt was able to bridge when pushing through LLE and assist therapist with pulling up pants.  Pt was able to pull self up in bed by pushing through LLE and pulling up with BUE.  Therapy Documentation Precautions:  Precautions Precautions: Fall Required Braces or Orthoses: Other Brace/Splint Other Brace/Splint: Bledsoe Brace Restrictions Weight Bearing Restrictions: Yes LLE Weight Bearing: Touchdown weight bearing Pain: Pain Assessment Pain Assessment: 0-10 Pain Score:   6 Pain Type: Surgical pain Pain Location: Leg Pain Orientation: Left Pain Descriptors / Indicators: Aching Pain Onset:  On-going Patients Stated Pain Goal: 2 Pain Intervention(s): RN made aware;Repositioned  See FIM for current functional status  Therapy/Group: Individual Therapy  Pt seated in w/c with friend at side.  Assisted pt to bed using sliding board for transfer.  Pt directed therapist with positioning of LLE during transfer and was able to manage LLE approx 50% of time without assistance.  Pt performed transfer with min A (+2 for safety). Pt stated pain level at 11/10 (RN notified and LLE repositoined).  Pt's LLE noted to have increased swelling since AM session. RN notified and ice bag placed on LLE.  Focus on activity tolerance, transfers, and bed mobility  [name] 07/19/2012, 1:37 PM

## 2012-07-19 NOTE — Plan of Care (Signed)
Problem: RH BLADDER ELIMINATION Goal: RH STG MANAGE BLADDER WITH ASSISTANCE STG Manage Bladder With Modified independent  Outcome: Progressing Uses urinal. Staff removes and empties

## 2012-07-20 ENCOUNTER — Inpatient Hospital Stay (HOSPITAL_COMMUNITY): Payer: Medicaid Other

## 2012-07-20 ENCOUNTER — Inpatient Hospital Stay (HOSPITAL_COMMUNITY): Payer: Medicaid Other | Admitting: Physical Therapy

## 2012-07-20 ENCOUNTER — Inpatient Hospital Stay (HOSPITAL_COMMUNITY): Payer: Medicaid Other | Admitting: *Deleted

## 2012-07-20 DIAGNOSIS — M79609 Pain in unspecified limb: Secondary | ICD-10-CM

## 2012-07-20 DIAGNOSIS — R609 Edema, unspecified: Secondary | ICD-10-CM

## 2012-07-20 DIAGNOSIS — S72453A Displaced supracondylar fracture without intracondylar extension of lower end of unspecified femur, initial encounter for closed fracture: Secondary | ICD-10-CM

## 2012-07-20 LAB — GLUCOSE, CAPILLARY
Glucose-Capillary: 108 mg/dL — ABNORMAL HIGH (ref 70–99)
Glucose-Capillary: 105 mg/dL — ABNORMAL HIGH (ref 70–99)
Glucose-Capillary: 146 mg/dL — ABNORMAL HIGH (ref 70–99)
Glucose-Capillary: 158 mg/dL — ABNORMAL HIGH (ref 70–99)

## 2012-07-20 MED ORDER — MORPHINE SULFATE ER 30 MG PO TBCR
30.0000 mg | EXTENDED_RELEASE_TABLET | Freq: Two times a day (BID) | ORAL | Status: DC
  Administered 2012-07-20 – 2012-07-22 (×5): 30 mg via ORAL
  Filled 2012-07-20 (×5): qty 1

## 2012-07-20 NOTE — Progress Notes (Signed)
Recreational Therapy Session Note  Patient Details  Name: Chad [name] MRN: [id number] Date of Birth: November 17, 1955 Today's Date: 07/20/2012 Time:  1300-1330 Pain: 10/10 LLE, premedicated, RN aware, repositioned & applied ice packs Skilled Therapeutic Interventions/Progress Updates: Pt seated in w/c upon arrival. Pt's LLE exhibited increased swelling since AM and patient stated his pain was at 10/10. Pt scheduled for CT/CRay after therapy and after discussion with patient's nurse, pt assisted patient back to bed to elevate LLE. Pt performed sliding board transfer after board placement with steady A +2 to prevent w/c from moving. Pt requird min A for managing LLE during transfer. Pt provided appropriate directions to therapist during transfer. Ice bags applied to knee area of LLE once positioned in bed and call bell and phone at bedside.   Therapy/Group: Co-Treatment   Kalle Bernath 07/20/2012, 4:15 PM

## 2012-07-20 NOTE — Plan of Care (Signed)
Problem: RH BOWEL ELIMINATION Goal: RH STG MANAGE BOWEL W/MEDICATION W/ASSISTANCE STG Manage Bowel with Medication with Min Assistance.  Outcome: Progressing Currently on stool softner

## 2012-07-20 NOTE — Patient Care Conference (Signed)
Inpatient RehabilitationTeam Conference and Plan of Care Update Date: 07/20/2012   Time: 2:20 PM    Patient Name: Chad [name]      Medical Record Number: [id number]  Date of Birth: [birth year] Sex: Male         Room/Bed: 4001/4001-01 Payor Info: Payor: /    Admitting Diagnosis: L FEMUR FX  Admit Date/Time:  07/15/2012  3:40 PM Admission Comments: No comment available   Primary Diagnosis:  Femur fracture, left Principal Problem: Femur fracture, left  Patient Active Problem List   Diagnosis Date Noted  . Femur fracture, left 07/16/2012  . Fracture, femur, distal 07/10/2012  . Diarrhea 03/16/2012  . Nausea and vomiting 03/13/2012  . Hx of amiodarone therapy 03/13/2012  . Steroid long-term use 03/13/2012  . Pulmonary hypertension 03/13/2012  . CHF (congestive heart failure) 12/08/2011  . Community acquired pneumonia 12/08/2011  . PAF (paroxysmal atrial fibrillation) 12/08/2011  . History of renal transplant 12/08/2011  . HTN (hypertension) 12/08/2011  . Respiratory failure with hypoxia 12/08/2011  . CKD (chronic kidney disease) 11/18/2011  . Hypertension 11/18/2011  . OSA (obstructive sleep apnea) 11/13/2011  . Chronic systolic CHF (congestive heart failure) 10/22/2011  . Acute on chronic systolic heart failure 10/03/2011  . Cellulitis of leg, left 08/27/2011  . Atrial flutter 08/27/2011  . Ulcer of left lower leg 08/27/2011  . Conjunctivitis, acute, left eye 08/27/2011  . 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 Septic shock 06/10/2011  . Acute renal failure 06/10/2011  . Cardiomyopathy 06/10/2011  . Acute systolic heart failure 06/09/2011  . Renal transplant disorder 06/05/2011  . HCAP (healthcare-associated pneumonia) 06/04/2011  . Thrombocytopenia 06/04/2011  . Acute respiratory failure 06/04/2011  . ARDS (adult respiratory distress syndrome) 06/04/2011    Expected Discharge Date: Expected Discharge Date: 08/05/12  Team Members Present: Physician leading  conference: Dr. Faith Rogue Social Worker Present: Amada Jupiter, LCSW Nurse Present: Daryll Brod, RN PT Present: Karolee Stamps, PT OT Present: Mackie Pai, OT;Patricia Mat Carne, OT;Ardis Rowan, COTA Other (Discipline and Name): Tora Duck, PPS Coordinator     Current Status/Progress Goal Weekly Team Focus  Medical   distal femur fracture on left. pain a major issue  improve activity tolerance and pain control. maintain ortho precatuions  pain control, edema control   Bowel/Bladder   Contient of bowel and bladder. LBM 07/19/12. Uses urinal, requires staff to remove and empty  Pt to remain continent of bowel and bladder.  Monitor   Swallow/Nutrition/ Hydration             ADL's   bathing-mod A; LB dsg-tot A; toilet transfers-tot A+2 for safety; toileting-tot A+2  bathing-min A; UB dsg-supervision; LB dse; min A; toilet transfers-min A; toileting-tot A  transfers, toileting, activity tolerance, safety awareness   Mobility   sliding board transfers level surface min assist, sit <> stands +2 total assist  supervision transfers, modified independent wheelchair mobility  Activity tolerance, strengthening, increased independence with transfers, standing activities.    Communication             Safety/Cognition/ Behavioral Observations            Pain   Scheduled Oxy CR 20mg  q 12hrs, Oxy Ir 10mg  q 4hrs  <5  Offer pain medication 1hr prior to therapy session   Skin   Bruising to L forearm, L femur fx with compression cdi, visibly edemaous  No additional skin breakdown  Continue to assess skin for healing and decreased swelling      *See  Care Plan and progress notes for long and short-term goals.  Barriers to Discharge: pain, ortho restrictions    Possible Resolutions to Barriers:  supervision/assistance at home    Discharge Planning/Teaching Needs:  home with private caregiver to assist (cannot provide much physical assistance) - concern that he may not reach level that can be  managed at home by this caregiver.      Team Discussion:  To perform doppler today to r/o DVT.  Addressing pain/ coping issues.  Needs encouragement to keep leg elevated.  Transfers are improved to steady assist +2.  Good progress with lat leans.  SW to follow up with pt about anticipated assist needs.  Revisions to Treatment Plan:  None   Continued Need for Acute Rehabilitation Level of Care: The patient requires daily medical management by a physician with specialized training in physical medicine and rehabilitation for the following conditions: Daily direction of a multidisciplinary physical rehabilitation program to ensure safe treatment while eliciting the highest outcome that is of practical value to the patient.: Yes Daily medical management of patient stability for increased activity during participation in an intensive rehabilitation regime.: Yes Daily analysis of laboratory values and/or radiology reports with any subsequent need for medication adjustment of medical intervention for : Other;Post surgical problems  Chad [name] 07/20/2012, 4:28 PM

## 2012-07-20 NOTE — Progress Notes (Addendum)
VASCULAR LAB PRELIMINARY  PRELIMINARY  PRELIMINARY  PRELIMINARY  Left lower extremity venous duplex completed.    Preliminary report:  Technically difficult study due to edema and pain. Limited visibility from the the distal thigh to the ankle. No obvious evidence of DVT in the areas that could be imaged. Exam somewhat inconclusive due to limitations.  Kjerstin Abrigo, RVS 07/20/2012, 4:15 PM

## 2012-07-20 NOTE — Progress Notes (Signed)
Physical Therapy Session Note  Patient Details  Name: Chad [name] MRN: [id number] Date of Birth: 1955-06-10  Today's Date: 07/20/2012 Time: 1000-1030 Time Calculation (min): 30 min  Short Term Goals: Week 1:  PT Short Term Goal 1 (Week 1): Pt will perform sliding board transfers to level surfaces with min assist.  PT Short Term Goal 2 (Week 1): Pt will perform sit <> stand from elevated mat with +2 total assist. PT Short Term Goal 3 (Week 1): Pt will propel wheelchair x 100' with supervision.  Skilled Therapeutic Interventions/Progress Updates:    Pt supine in bed with ice packs to L LE at initiation of treatment session and reported 8-9/10 painscore in L LE and stated he had just received pain meds a few minutes before, pain behaviors of grimacing while performing supine to sit min assist with HOB slightly elevated and assistance with L LE to get to sitting at EOB, increased pain noted with increased movement and mobility, sliding board transfer bed<w/c with min assist.  Pt also performed w/c mobility with use of L LE elevated leg with supervision assist for 160 feet, pt required slight increased vcs for navigating turning into doorway.  Pt very pleasant and motivated.  Therapy Documentation Precautions:  Precautions Precautions: Fall Required Braces or Orthoses: Other Brace/Splint Other Brace/Splint: Bledsoe Brace Restrictions Weight Bearing Restrictions: Yes LLE Weight Bearing: Touchdown weight bearing       Pain: Pain Assessment Pain Assessment: 0-10 Pain Score:   8-9/10 Pain Type: Surgical pain Pain Location: Leg Pain Orientation: Left Pain Descriptors / Indicators: Aching Patients Stated Pain Goal: 2 Pain Intervention(s): Per pt report pain medication administered before therapy                      See FIM for current functional status  Therapy/Group: Individual Therapy  Jackelyn Knife 07/20/2012, 11:24 AM

## 2012-07-20 NOTE — Progress Notes (Signed)
Occupational Therapy Session Note  Patient Details  Name: Chad [name] MRN: [id number] Date of Birth: 07-01-1955  Today's Date: 07/20/2012 Time: 1130-1150 Time Calculation (min): 20 min  Short Term Goals: Week 1:  OT Short Term Goal 1 (Week 1): LB Bath:  Mod assist sitting EOB to include lateral leans and use of AE OT Short Term Goal 2 (Week 1): LB Dressing:  Mod assist to donn pants/shorts sitting EOB to include lateral leans and use of AE OT Short Term Goal 3 (Week 1): Toilet Transfer: Mod Assist for drop arm commode transfers using slide board PRN OT Short Term Goal 4 (Week 1): Toileting: Mod assist with pants down and up using lateral leans OT Short Term Goal 5 (Week 1): BUE strengthening:  Supervision performing HEP  Skilled Therapeutic Interventions/Progress Updates:    Therapy session focused on use of AE and forward flexion to facilitate functional transfers and assist with LB dressing tasks. Pt demonstrated good carryover with use of reacher and sock-aid to don/doff sock. Engaged in forward reach activity to lower surface to facility hip flexion. Pt with increase in pain during this activity however willing to continue. Pt missed 10 min of therapy secondary to increase in pain at end of therapy session. Pt demonstrated pursed lip breathing to assist with pain relief however reported it was not helping. Repositioning in w/c and with LLE to assist with pain relief and comfort.   Therapy Documentation Precautions:  Precautions Precautions: Fall Required Braces or Orthoses: Other Brace/Splint Other Brace/Splint: Bledsoe Brace Restrictions Weight Bearing Restrictions: Yes LLE Weight Bearing: Touchdown weight bearing General: General Amount of Missed OT Time (min): 10 Minutes Vital Signs:   Pain: Reported pain of 10/10 while sitting in chair in LLE. Repositioned LLE and pt reported it helped relieve pain some.   Other Treatments:    See FIM for current functional  status  Therapy/Group: Individual Therapy  Daneil Dan 07/20/2012, 11:53 AM

## 2012-07-20 NOTE — Plan of Care (Signed)
Problem: RH SAFETY Goal: RH STG ADHERE TO SAFETY PRECAUTIONS W/ASSISTANCE/DEVICE STG Adhere to Safety Precautions With supervision  Outcome: Progressing No unsafe behavior noted

## 2012-07-20 NOTE — Progress Notes (Signed)
Subjective/Complaints: Pain still an issue. Left leg with more swelling. Pain generally at knee A 12 point review of systems has been performed and if not noted above is otherwise negative.   Objective: Vital Signs: Blood pressure 153/86, pulse 81, temperature 98.2 F (36.8 C), temperature source Oral, resp. rate 18, weight 123 kg (271 lb 2.7 oz), SpO2 99.00%. No results found.  Recent Labs  07/18/12 0556  WBC 15.5*  HGB 7.9*  HCT 25.1*  PLT 207    Recent Labs  07/18/12 0555  NA 133*  K 3.0*  CL 93*  GLUCOSE 102*  BUN 83*  CREATININE 3.31*  CALCIUM 8.7   CBG (last 3)   Recent Labs  07/19/12 1625 07/19/12 2131 07/20/12 0732  GLUCAP 134* 136* 108*    Wt Readings from Last 3 Encounters:  07/16/12 123 kg (271 lb 2.7 oz)  07/13/12 122.471 kg (270 lb)  07/13/12 122.471 kg (270 lb)    Physical Exam:  Constitutional: He is oriented to person, place, and time.  HENT:  Head: Normocephalic.  Eyes: EOM are normal.  Neck: Neck supple. No thyromegaly present.  Cardiovascular:  Cardiac rate controlled  Pulmonary/Chest:  Decreased breath sounds but clear to auscultation  Abdominal: Soft. Bowel sounds are normal. He exhibits no distension.  Neurological: He is alert and oriented to person, place, and time.  Patient follows full commands. Motor function within normal limits in UE's and RLE. Sensory function nl  Skin:  Surgical site is dressed. Left leg in KI/ appropriately tender  Psychiatric: he anxious but otherwise His behavior is normal. Judgment and thought content normal  Neuro:  Eyes without evidence of nystagmus  Tone is normal without evidence of spasticity  Cerebellar exam shows no evidence of ataxia on finger nose finger or heel to shin testing  No evidence of trunkal ataxia  Motor strength is 5/5 in bilateral deltoid, biceps, triceps, finger flexors and extensors, wrist flexors and extensors, hip flexors, knee flexors and extensors, ankle dorsiflexors,  plantar flexors, invertors and evertors, toe flexors and extensors  Sensory exam is normal to pinprick, proprioception and light touch in the upper and lower limbs    Assessment/Plan: 1. Functional deficits secondary to distal left femur fx which require 3+ hours per day of interdisciplinary therapy in a comprehensive inpatient rehab setting. Physiatrist is providing close team supervision and 24 hour management of active medical problems listed below. Physiatrist and rehab team continue to assess barriers to discharge/monitor patient progress toward functional and medical goals. FIM: FIM - Bathing Bathing Steps Patient Completed: Chest;Right Arm;Left Arm;Abdomen;Front perineal area;Right upper leg Bathing: 3: Mod-Patient completes 5-7 60f 10 parts or 50-74%  FIM - Upper Body Dressing/Undressing Upper body dressing/undressing steps patient completed: Thread/unthread right sleeve of pullover shirt/dresss;Thread/unthread left sleeve of pullover shirt/dress;Put head through opening of pull over shirt/dress;Pull shirt over trunk Upper body dressing/undressing: 5: Set-up assist to: Obtain clothing/put away FIM - Lower Body Dressing/Undressing Lower body dressing/undressing: 1: Total-Patient completed less than 25% of tasks  FIM - Toileting Toileting: 1: Two helpers  FIM - Diplomatic Services operational officer Devices: Human resources officer Transfers: 1-Two helpers;4-From toilet/BSC: Min A (steadying Pt. > 75%);4-To toilet/BSC: Min A (steadying Pt. > 75%)  FIM - Bed/Chair Transfer Bed/Chair Transfer Assistive Devices: Bed rails;Arm rests;Sliding board;HOB elevated Bed/Chair Transfer: 4: Supine > Sit: Min A (steadying Pt. > 75%/lift 1 leg);4: Bed > Chair or W/C: Min A (steadying Pt. > 75%);4: Chair or W/C > Bed: Min A (steadying Pt. >  75%)  FIM - Locomotion: Wheelchair Distance: 45' Locomotion: Wheelchair: 5: Travels 150 ft or more: maneuvers on rugs and over door sills  with supervision, cueing or coaxing FIM - Locomotion: Ambulation Ambulation/Gait Assistance:  (unsafe to perform, pt unable to reach standing)  Comprehension Comprehension Mode: Auditory Comprehension: 5-Understands complex 90% of the time/Cues < 10% of the time  Expression Expression Mode: Verbal Expression: 7-Expresses complex ideas: With no assist  Social Interaction Social Interaction: 6-Interacts appropriately with others with medication or extra time (anti-anxiety, antidepressant).  Problem Solving Problem Solving: 7-Solves complex problems: Recognizes & self-corrects  Memory Memory: 7-Complete Independence: No helper  Medical Problem List and Plan:  1. Left distal femur fracture after fall. Status post ORIF 07/10/2012  2. DVT Prophylaxis/Anticoagulation: Subcutaneous Lovenox. Monitor platelet counts and any signs of bleeding. Re-ordered dopplers---he will need to have brace removed to perform studies.  3. Pain Management: Oxycodone as needed. Monitor with increased mobility   -will change to mscontin cr considering costs  -femur xr stable 4. Neuropsych: This patient is capable of making decisions on his/her own behalf.  5. Acute blood loss anemia. Latest hemoglobin 7.5. Followup CBC and transfuse as appropriate. Continue Aranesp as advised  6. Chronic kidney disease and renal transplant 1996. Baseline creatinine 3.00. Followup per renal services. Continue prednisone/Sirolimus as directed  7. Hypertension/atrial fibrillation. Amiodarone 200 mg daily, coreg 25 mg twice a day, Lasix 80 mg twice a day, hydralazine 25 mg 3 times a day, M.D. or 60 mg daily. Monitor with increased mobility. Patient no chest pain or shortness of breath  8. Nonischemic cardiomyopathy. Continue aspirin therapy.  9. Systolic congestive heart failure. Continue Lasix as advised. Monitor for any signs of fluid overload  10. Non-insulin-dependent diabetes mellitus. Hemoglobin A1c 5.7. Blood sugars under fair  control at present 11. History of gout. Continue allopurinol. Monitor for any gout flareups.  12. Hyperlipidemia. Lipitor  13. Obstructive sleep apnea. CPAP  14. Leukocytosis: no signs of cellulitis  -urine clean  -IS  -likely steroid effect  LOS (Days) 5 A FACE TO FACE EVALUATION WAS PERFORMED  SWARTZ,ZACHARY T 07/20/2012 7:46 AM

## 2012-07-20 NOTE — Progress Notes (Signed)
Physical Therapy Note  Patient Details  Name: Chad [name] MRN: [id number] Date of Birth: 25-May-1955 Today's Date: 07/20/2012  Time: 1030-1100 30 minutes  1:1 Pt c/o 8/10 pain, meds rec'd, RN aware.  Sit to stand training from elevated mat with min A.  Pt able to stand in 30 second bouts with prolonged rest breaks in between.  Pt requires cues for deep breathing for pain control during standing. Pt able to transfer with supervision with sliding board, assist only for board placement.  Scooting laterally and fwd/bkwd with min A for managing L LE.  Pt motivated but limited by pain.   Sukhmani Fetherolf 07/20/2012, 10:58 AM

## 2012-07-20 NOTE — Progress Notes (Signed)
Occupational Therapy Session Note  Patient Details  Name: Chad [name] MRN: [id number] Date of Birth: [birth year]  Today's Date: 07/20/2012 Time: 0800-0900 Time Calculation (min): 60 min  Short Term Goals: Week 1:  OT Short Term Goal 1 (Week 1): LB Bath:  Mod assist sitting EOB to include lateral leans and use of AE OT Short Term Goal 2 (Week 1): LB Dressing:  Mod assist to donn pants/shorts sitting EOB to include lateral leans and use of AE OT Short Term Goal 3 (Week 1): Toilet Transfer: Mod Assist for drop arm commode transfers using slide board PRN OT Short Term Goal 4 (Week 1): Toileting: Mod assist with pants down and up using lateral leans OT Short Term Goal 5 (Week 1): BUE strengthening:  Supervision performing HEP  Skilled Therapeutic Interventions/Progress Updates:    Pt resting in bed upon arrival and requested to transfer to [facility] for BM and to perform bathing and dressing while seated on BSC.  Pt performed sliding board transfer to drop arm BSC requiring assistance to steady board and BSC.  Pt was able to manage LLE during transfer without assistance.  Pt required tot A for toilet hygiene but was able to lean laterally adequate enough to facilitate therapist performing hygiene.  Pt was able to raise off BSC to allow therapist to pull up pants before transfer back to bed.  Pt c/o increased sensitivity to pain at knee region.  RN notified.  Therapy Documentation Precautions:  Precautions Precautions: Fall Required Braces or Orthoses: Other Brace/Splint Other Brace/Splint: Bledsoe Brace Restrictions Weight Bearing Restrictions: Yes LLE Weight Bearing: Touchdown weight bearing Pain: Pain Assessment Pain Assessment: 0-10 Pain Score:   5 Pain Type: Surgical pain Pain Location: Leg Pain Orientation: Left Pain Descriptors / Indicators: Aching Patients Stated Pain Goal: 2 Pain Intervention(s): RN made aware;Repositioned  See FIM for current functional status  Therapy/Group:  Individual Therapy  Session 2 Time: 1300-1330 (pt missed 15 mins therapy secondary to increased pain) Pt c/o 10/10 pain;RN notitified; repositioned; ice packs applied Individual Therapy  Cotx with Recreational Therapist.  Pt seated in w/c upon arrival.  Pt's LLE exhibited increased swelling since AM session and patient stated his pain was at 10/10.  Pt scheduled for CT/CRay after therapy and after discussion with patient's nurse I assisted patient back to bed to elevate LLE.  Pt performed sliding board transfer after board placement with steady A and Recreational Therapist preventing w/c from moving.  Pt requird min A for managing LLE during transfer.  Pt provided appropriate directions to therapist during transfer.  Ice bags applied to knee area of LLE once positioned in bed and call bell and phone at bedside.    [name] [facility] 07/20/2012, 9:20 AM

## 2012-07-21 ENCOUNTER — Inpatient Hospital Stay (HOSPITAL_COMMUNITY): Payer: Medicaid Other | Admitting: Physical Therapy

## 2012-07-21 ENCOUNTER — Inpatient Hospital Stay (HOSPITAL_COMMUNITY): Payer: Medicaid Other | Admitting: Occupational Therapy

## 2012-07-21 ENCOUNTER — Inpatient Hospital Stay (HOSPITAL_COMMUNITY): Payer: Medicaid Other

## 2012-07-21 DIAGNOSIS — S72453A Displaced supracondylar fracture without intracondylar extension of lower end of unspecified femur, initial encounter for closed fracture: Secondary | ICD-10-CM

## 2012-07-21 LAB — GLUCOSE, CAPILLARY: Glucose-Capillary: 104 mg/dL — ABNORMAL HIGH (ref 70–99)

## 2012-07-21 NOTE — Progress Notes (Signed)
Note reviewed and accurately reflects treatment session.   

## 2012-07-21 NOTE — Progress Notes (Signed)
Occupational Therapy Session Note  Patient Details  Name: Chad [name] MRN: [id number] Date of Birth: [birth year]  Today's Date: 07/21/2012 Time: 0800-0900 Time Calculation (min): 60 min  Short Term Goals: Week 1:  OT Short Term Goal 1 (Week 1): LB Bath:  Mod assist sitting EOB to include lateral leans and use of AE OT Short Term Goal 2 (Week 1): LB Dressing:  Mod assist to donn pants/shorts sitting EOB to include lateral leans and use of AE OT Short Term Goal 3 (Week 1): Toilet Transfer: Mod Assist for drop arm commode transfers using slide board PRN OT Short Term Goal 4 (Week 1): Toileting: Mod assist with pants down and up using lateral leans OT Short Term Goal 5 (Week 1): BUE strengthening:  Supervision performing HEP  Skilled Therapeutic Interventions/Progress Updates:    Pt resting in bed upon arrival and requested to transfer to [facility] for bowel movement.  Pt completed bathing and dressing tasks while seated on BSC.  Pt performed sliding board bed<>drop arm BSC with steady assist to manage leg and steady BSC.  Pt utilized reacher to thread pants over RLE and raised buttocks off BSC for therapist to pull pants up.  Pt practiced lateral leans while seated on BSC in preparation for pulling pants up utilizing lateral leans.  Pt returned to bed at end of session to elevate LLE.  Focus on activity tolerance, transfers, lateral leans, AE use, and safety awareness.  Therapy Documentation Precautions:  Precautions Precautions: Fall Required Braces or Orthoses: Other Brace/Splint Other Brace/Splint: Bledsoe Brace Restrictions Weight Bearing Restrictions: Yes LLE Weight Bearing: Touchdown weight bearing General:   Pain: Pain Assessment Pain Score:   5 Pain Type: Acute pain Pain Location: Leg Pain Orientation: Left;Anterior;Mid Pain Descriptors / Indicators: Aching Pain Onset: On-going Patients Stated Pain Goal: 2 Pain Intervention(s): RN made aware;Repositioned  See FIM for current  functional status  Therapy/Group: Individual Therapy  [name] 07/21/2012, 9:09 AM

## 2012-07-21 NOTE — Progress Notes (Signed)
Subjective/Complaints: Pain better last night. Able to sleep. A 12 point review of systems has been performed and if not noted above is otherwise negative.   Objective: Vital Signs: Blood pressure 132/76, pulse 73, temperature 98.5 F (36.9 C), temperature source Oral, resp. rate 17, weight 123 kg (271 lb 2.7 oz), SpO2 95.00%. No results found. No results found for this basename: WBC, HGB, HCT, PLT,  in the last 72 hours No results found for this basename: NA, K, CL, CO, GLUCOSE, BUN, CREATININE, CALCIUM,  in the last 72 hours CBG (last 3)   Recent Labs  07/20/12 1629 07/20/12 2044 07/21/12 0722  GLUCAP 146* 158* 97    Wt Readings from Last 3 Encounters:  07/16/12 123 kg (271 lb 2.7 oz)  07/13/12 122.471 kg (270 lb)  07/13/12 122.471 kg (270 lb)    Physical Exam:  Constitutional: He is oriented to person, place, and time.  HENT:  Head: Normocephalic.  Eyes: EOM are normal.  Neck: Neck supple. No thyromegaly present.  Cardiovascular:  Cardiac rate controlled  Pulmonary/Chest:  Decreased breath sounds but clear to auscultation  Abdominal: Soft. Bowel sounds are normal. He exhibits no distension.  Neurological: He is alert and oriented to person, place, and time.  Patient follows full commands. Motor function within normal limits in UE's and RLE. Sensory function nl  Skin:  Surgical site is dressed. Left leg remains tender. Wound intact Musc: left leg with persistent edema----2+ to 3+. Non pitting. Psychiatric: he anxious but otherwise His behavior is normal. Judgment and thought content normal  Neuro:  Eyes without evidence of nystagmus  Tone is normal without evidence of spasticity  Cerebellar exam shows no evidence of ataxia on finger nose finger or heel to shin testing  No evidence of trunkal ataxia  Motor strength is 5/5 in bilateral deltoid, biceps, triceps, finger flexors and extensors, wrist flexors and extensors, hip flexors, knee flexors and extensors, ankle  dorsiflexors, plantar flexors, invertors and evertors, toe flexors and extensors  Sensory exam is normal to pinprick, proprioception and light touch in the upper and lower limbs    Assessment/Plan: 1. Functional deficits secondary to distal left femur fx which require 3+ hours per day of interdisciplinary therapy in a comprehensive inpatient rehab setting. Physiatrist is providing close team supervision and 24 hour management of active medical problems listed below. Physiatrist and rehab team continue to assess barriers to discharge/monitor patient progress toward functional and medical goals. FIM: FIM - Bathing Bathing Steps Patient Completed: Chest;Right Arm;Left Arm;Abdomen;Front perineal area;Right upper leg Bathing: 3: Mod-Patient completes 5-7 37f 10 parts or 50-74%  FIM - Upper Body Dressing/Undressing Upper body dressing/undressing steps patient completed: Thread/unthread right sleeve of pullover shirt/dresss;Thread/unthread left sleeve of pullover shirt/dress;Put head through opening of pull over shirt/dress;Pull shirt over trunk Upper body dressing/undressing: 5: Set-up assist to: Obtain clothing/put away FIM - Lower Body Dressing/Undressing Lower body dressing/undressing: 1: Total-Patient completed less than 25% of tasks  FIM - Toileting Toileting: 1: Two helpers  FIM - Diplomatic Services operational officer Devices: Human resources officer Transfers: 1-Two helpers;4-From toilet/BSC: Min A (steadying Pt. > 75%);4-To toilet/BSC: Min A (steadying Pt. > 75%)  FIM - Banker Devices: Sliding board;Bed rails;HOB elevated Bed/Chair Transfer: 4: Supine > Sit: Min A (steadying Pt. > 75%/lift 1 leg);4: Bed > Chair or W/C: Min A (steadying Pt. > 75%)  FIM - Locomotion: Wheelchair Distance: 45' Locomotion: Wheelchair: 5: Travels 150 ft or more: maneuvers on rugs and over  door sills with supervision, cueing or coaxing FIM -  Locomotion: Ambulation Ambulation/Gait Assistance:  (unsafe to perform, pt unable to reach standing)  Comprehension Comprehension Mode: Auditory Comprehension: 5-Understands complex 90% of the time/Cues < 10% of the time  Expression Expression Mode: Verbal Expression: 7-Expresses complex ideas: With no assist  Social Interaction Social Interaction: 6-Interacts appropriately with others with medication or extra time (anti-anxiety, antidepressant).  Problem Solving Problem Solving: 7-Solves complex problems: Recognizes & self-corrects  Memory Memory: 7-Complete Independence: No helper  Medical Problem List and Plan:  1. Left distal femur fracture after fall. Status post ORIF 07/10/2012  2. DVT Prophylaxis/Anticoagulation: Subcutaneous Lovenox. Monitor platelet counts and any signs of bleeding.   -doppler test limited but negative 3. Pain Management: Oxycodone as needed. Monitor with increased mobility   -MS contin has proven helpful  -femur xr stable 4. Neuropsych: This patient is capable of making decisions on his/her own behalf.  5. Acute blood loss anemia. Latest hemoglobin 7.5. Followup CBC and transfuse as appropriate. Continue Aranesp as advised  6. Chronic kidney disease and renal transplant 1996. Baseline creatinine 3.00. Followup per renal services. Continue prednisone/Sirolimus as directed  7. Hypertension/atrial fibrillation. Amiodarone 200 mg daily, coreg 25 mg twice a day, Lasix 80 mg twice a day, hydralazine 25 mg 3 times a day, M.D. or 60 mg daily. Monitor with increased mobility. Patient no chest pain or shortness of breath  8. Nonischemic cardiomyopathy. Continue aspirin therapy.  9. Systolic congestive heart failure. Continue Lasix as advised. Monitor for any signs of fluid overload  10. Non-insulin-dependent diabetes mellitus. Hemoglobin A1c 5.7. Blood sugars under fair control at present 11. History of gout. Continue allopurinol. Monitor for any gout flareups.   12. Hyperlipidemia. Lipitor  13. Obstructive sleep apnea. CPAP  14. Leukocytosis: no signs of cellulitis  -urine clean  -IS  -likely steroid effect  LOS (Days) 6 A FACE TO FACE EVALUATION WAS PERFORMED  SWARTZ,ZACHARY T 07/21/2012 8:16 AM

## 2012-07-21 NOTE — Progress Notes (Signed)
Recreational Therapy Session Note  Patient Details  Name: Chad [name] MRN: [id number] Date of Birth: [birth year] Today's Date: 07/21/2012 Time:  1400-1500 Pain: 8/10 seated, 12/10 with activity/standing, premedicated, repositioning offered, choices in therapy intervention given, Pt assisted back to bed and ice packs applied at sessions completion. Skilled Therapeutic Interventions/Progress Updates: Pt is extremely motivated to participate in therapies in spite of pain.  Session focused on overall activity tolerance, standing tolerance while maintaining precautions.  Discussion with pt about pain management options throughout this therapy session including a modified therapy schedule with administration of scheduled & PRN meds in addition to a scheduled rest break in bed to assist with decreasing pain and swelling in LLE.  Pt agreeable, RN made aware & to share in reports with oncoming nurses.  Therapy/Group: Co-Treatment  Lalla Laham 07/21/2012, 3:50 PM

## 2012-07-21 NOTE — Progress Notes (Signed)
Social Work Patient ID: Chad [name], male   DOB: January 10, 1956, 57 y.o.   MRN: [id number]   Met yesterday with patient to review team conference - pt aware and agreeable with targeted d/c date of 7/10 with mod i w/c goals to mod amb.  Discussed concerns that Ms. Chad [name] (private caregiver) may not be able to meet these care needs.  Pt reports that he has assistance available from his son and a friend who have both agreed to be the caregivers.  He also recognized that his initial plan with Ms. Chad [name] was not going to be appropriate at least initially.  Will continue to follow for support and d/c planning.  Chad Sobczyk, LCSW

## 2012-07-21 NOTE — Progress Notes (Signed)
Physical Therapy Session Note  Patient Details  Name: Chad [name] MRN: [id number] Date of Birth: 12/02/1955  Today's Date: 07/21/2012 Time: 1000-1031 and 1400-1500 Time Calculation (min): 31 min and 60 min  Short Term Goals: Week 1:  PT Short Term Goal 1 (Week 1): Pt will perform sliding board transfers to level surfaces with min assist.  PT Short Term Goal 2 (Week 1): Pt will perform sit <> stand from elevated mat with +2 total assist. PT Short Term Goal 3 (Week 1): Pt will propel wheelchair x 100' with supervision.  Skilled Therapeutic Interventions/Progress Updates:   Pt resting in bed; reporting 9/10 pain in LLE anterior thigh.  Reporting that pain feels different from previous days pain; reports more throbbing.  Pt thigh warm to touch on anterior thigh through ace wrap.  Pt reports premedication.  Vitals assessed.  RN notified of pain and warmth at incision.  Pt requested to stay in bed since next therapy session is after lunch.  In bed performed AAROM exercises LLE for ROM, muscle relaxation and pain management: 10-12 reps ankle pumps, ankle rotations, isometric quad and glute sets, hip ABD, hip IR to neutral.  Ice pack placed on anterior thigh for pain and edema management.    PM session: Pt resting on mat after OT session.  Performed supine > sit edge of mat with min-mod A to lift and advance LLE to EOB with pt verbalizing sequence.  From elevated mat performed 2 reps sit <> stand with mod lifting assistance with one UE support on RW; pt stood for 2 min first repetition and 30 seconds second repetition with focus on AROM LLE in small range laterally and forwards.  Second repetition added shoe to R foot to elevate LLE off floor even more.  During rest breaks discussed changing schedule around pain medication administration, chunking therapy together and longer rest break in middle of the day for better pain management and tolerance to therapy.  Also discussed home entry options and  equipment needs and discussed difficulty of WB and hopping with 4WW; may require 2WW for increased safety with WB.  Returned to w/c with slideboard with min A to advance LLE.  Transfer w/c > bed mod A towards L and sit > supine with mod A to bring LLE up onto bed and reposition.  Ice added to LLE for pain and edema management in supine.   Therapy Documentation Precautions:  Precautions Precautions: Fall Required Braces or Orthoses: Other Brace/Splint Other Brace/Splint: Bledsoe Brace Restrictions Weight Bearing Restrictions: Yes LLE Weight Bearing: Touchdown weight bearing Vital Signs: Therapy Vitals Temp: 98.3 F (36.8 C) Temp src: Oral Pulse Rate: 80 BP: 133/79 mmHg Oxygen Therapy SpO2: 95 % O2 Device: Nasal cannula O2 Flow Rate (L/min): 2 L/min Pulse Oximetry Type: Intermittent Pain: Pain Assessment Pain Assessment: 0-10 Pain Score:   9 Pain Type: Surgical pain Pain Location: Leg Pain Orientation: Left;Anterior;Mid Pain Descriptors / Indicators: Throbbing Pain Onset: On-going Patients Stated Pain Goal: 2 Pain Intervention(s): RN made aware;Cold applied;Other (Comment) (premedicated)  See FIM for current functional status  Therapy/Group: Individual Therapy  Edman Circle [facility] 07/21/2012, 10:54 AM

## 2012-07-21 NOTE — Progress Notes (Signed)
Occupational Therapy Session Note  Patient Details  Name: Chad [name] MRN: [id number] Date of Birth: 1955/02/19  Today's Date: 07/21/2012  Short Term Goals: Week 1:  OT Short Term Goal 1 (Week 1): LB Bath:  Mod assist sitting EOB to include lateral leans and use of AE OT Short Term Goal 2 (Week 1): LB Dressing:  Mod assist to donn pants/shorts sitting EOB to include lateral leans and use of AE OT Short Term Goal 3 (Week 1): Toilet Transfer: Mod Assist for drop arm commode transfers using slide board PRN OT Short Term Goal 4 (Week 1): Toileting: Mod assist with pants down and up using lateral leans OT Short Term Goal 5 (Week 1): BUE strengthening:  Supervision performing HEP  Skilled Therapeutic Interventions/Progress Updates:  Session Note: Time: 1300-1345 (45 mins) Pt reported 8/10 pain in L hip. Pt stated he received pain meds with the past 2 hrs.  Upon entering room, pt supine in bed. Pt->EOB then transferred->w/c using slide board (OT placed slide board, steady assist for transfer). Pt pushed self->gym using BUE's (~150 ft). From here pt transferred->therapy mat using slide board. Skilled intervention focused on BUE strengthening/endurnace, overall activity tolerance, lat leans with pt placing slide board underneath to increase independence with fxal transers, and w/c mobility. Pt on 2 L of O2 throughout session. O2 stats checked intermittently and stats remained between 88-93%. Pt educated on pursed-lip breathing when stats fell to 90% or less. At end of tx session, pt supine on therapy mat in gym with LLE elevated. PT notified of this for next tx session.  Therapy Documentation Precautions:  Precautions Precautions: Fall Required Braces or Orthoses: Other Brace/Splint Other Brace/Splint: Bledsoe Brace Restrictions Weight Bearing Restrictions: Yes LLE Weight Bearing: Touchdown weight bearing  See FIM for current functional status  Therapy/Group: Individual Therapy  [name] 07/21/2012, 2:33 PM

## 2012-07-22 ENCOUNTER — Inpatient Hospital Stay (HOSPITAL_COMMUNITY): Payer: Medicaid Other

## 2012-07-22 ENCOUNTER — Inpatient Hospital Stay (HOSPITAL_COMMUNITY): Payer: Medicaid Other | Admitting: Physical Therapy

## 2012-07-22 ENCOUNTER — Ambulatory Visit (HOSPITAL_COMMUNITY): Payer: Self-pay | Admitting: *Deleted

## 2012-07-22 LAB — GLUCOSE, CAPILLARY
Glucose-Capillary: 101 mg/dL — ABNORMAL HIGH (ref 70–99)
Glucose-Capillary: 131 mg/dL — ABNORMAL HIGH (ref 70–99)

## 2012-07-22 LAB — CBC WITH DIFFERENTIAL/PLATELET
Basophils Relative: 0 % (ref 0–1)
Eosinophils Absolute: 0.3 10*3/uL (ref 0.0–0.7)
HCT: 26.9 % — ABNORMAL LOW (ref 39.0–52.0)
Hemoglobin: 8.3 g/dL — ABNORMAL LOW (ref 13.0–17.0)
MCH: 28.1 pg (ref 26.0–34.0)
MCHC: 30.9 g/dL (ref 30.0–36.0)
Monocytes Absolute: 1.1 10*3/uL — ABNORMAL HIGH (ref 0.1–1.0)
Neutro Abs: 11.2 10*3/uL — ABNORMAL HIGH (ref 1.7–7.7)
Neutrophils Relative %: 75 % (ref 43–77)
RDW: 18 % — ABNORMAL HIGH (ref 11.5–15.5)

## 2012-07-22 LAB — RENAL FUNCTION PANEL
CO2: 28 mEq/L (ref 19–32)
Calcium: 8.9 mg/dL (ref 8.4–10.5)
Chloride: 96 mEq/L (ref 96–112)
GFR calc Af Amer: 23 mL/min — ABNORMAL LOW (ref >90)
GFR calc non Af Amer: 20 mL/min — ABNORMAL LOW (ref >90)
Potassium: 3.8 mEq/L (ref 3.5–5.1)
Sodium: 134 mEq/L — ABNORMAL LOW (ref 135–145)

## 2012-07-22 LAB — IRON AND TIBC
Iron: 29 ug/dL — ABNORMAL LOW (ref 42–135)
Saturation Ratios: 16 % — ABNORMAL LOW (ref 20–55)

## 2012-07-22 MED ORDER — FUROSEMIDE 80 MG PO TABS
100.0000 mg | ORAL_TABLET | Freq: Two times a day (BID) | ORAL | Status: DC
  Administered 2012-07-22 – 2012-08-05 (×28): 100 mg via ORAL
  Filled 2012-07-22 (×30): qty 1

## 2012-07-22 MED ORDER — MORPHINE SULFATE ER 15 MG PO TBCR
45.0000 mg | EXTENDED_RELEASE_TABLET | Freq: Two times a day (BID) | ORAL | Status: DC
  Administered 2012-07-22 – 2012-07-23 (×3): 45 mg via ORAL
  Filled 2012-07-22 (×3): qty 1

## 2012-07-22 MED ORDER — FERUMOXYTOL INJECTION 510 MG/17 ML
510.0000 mg | Freq: Once | INTRAVENOUS | Status: AC
  Administered 2012-07-22: 510 mg via INTRAVENOUS
  Filled 2012-07-22: qty 17

## 2012-07-22 MED ORDER — METHOCARBAMOL 500 MG PO TABS
500.0000 mg | ORAL_TABLET | Freq: Four times a day (QID) | ORAL | Status: DC | PRN
  Administered 2012-07-22 – 2012-08-05 (×22): 500 mg via ORAL
  Filled 2012-07-22 (×22): qty 1

## 2012-07-22 NOTE — Progress Notes (Signed)
Physical Therapy Session Note  Patient Details  Name: Chad [name] MRN: [id number] Date of Birth: 08-21-1955  Today's Date: 07/22/2012 Time: 0930-1030; 1045-1100 Time Calculation (min): 60 min + 15 min  Short Term Goals: Week 1:  PT Short Term Goal 1 (Week 1): Pt will perform sliding board transfers to level surfaces with min assist.  PT Short Term Goal 2 (Week 1): Pt will perform sit <> stand from elevated mat with +2 total assist. PT Short Term Goal 3 (Week 1): Pt will propel wheelchair x 100' with supervision.  Skilled Therapeutic Interventions/Progress Updates:    Bed mobility with supervision no rails with HOB slightly elevated, pt able to manage Lt. LE over EOB. Sliding board transfer with pt directing wheelchair set up, able to place board with supervision. Sliding board transfer to Lt. With supervision and PT to support wheelchair from sliding, cues for more Rt. LE recruitment to decrease sliding force and promote boost up and over to decrease sliding effect on board and chair. Wheelchair mobility 150 x 2 with supervision, occasional need for rest due to fatigue. Sit <> stands in parallel bars, successful 2/3 attempts however decreased control of descent due to weakness. Although Lt. LE supported by therapist fast descent to wheelchair increases pt's pain. Pt will benefit from further strengthening before sit <> stands from low wheelchair beneficial. Attempted to hop Rt. Foot forwards however pt unable to advance Rt. LE and maintain TDWB on Lt. Practiced scapular depressions/UE pushups while in standing for upper body strengthening in preparation for eventual hop gait.   Pt in pain and wished for rest break before returning to bed. PT returned 15 min later and pt performed wheelchair>bed transfer again practicing set up. Pt did need board to be placed and needed min assist for transfer and bed mobility, pain was the limiting factor. Pt positioned with Lt. LE elevated, all needs within  reach.   Therapy Documentation Precautions:  Precautions Precautions: Fall Required Braces or Orthoses: Other Brace/Splint Other Brace/Splint: Bledsoe Brace Restrictions Weight Bearing Restrictions: Yes LLE Weight Bearing: Touchdown weight bearing Pain: Pain Assessment Pain Assessment: 0-10 Pain Score:   10 Pain Type: Surgical pain Pain Location: Leg Pain Orientation: Left Pain Descriptors / Indicators: Aching Pain Onset: On-going, worse with activity RN made aware, 9/10 pain with return second session See FIM for current functional status  Therapy/Group: Individual Therapy both sessions  [name] 07/22/2012, 11:59 AM

## 2012-07-22 NOTE — Progress Notes (Signed)
Subjective/Complaints: Pain is improving but still limiting. A 12 point review of systems has been performed and if not noted above is otherwise negative.   Objective: Vital Signs: Blood pressure 133/78, pulse 82, temperature 98.7 F (37.1 C), temperature source Oral, resp. rate 16, weight 132.133 kg (291 lb 4.8 oz), SpO2 94.00%. No results found.  Recent Labs  07/22/12 0545  WBC 15.0*  HGB 8.3*  HCT 26.9*  PLT 260    Recent Labs  07/22/12 0545  NA 134*  K 3.8  CL 96  GLUCOSE 102*  BUN 90*  CREATININE 3.26*  CALCIUM 8.9   CBG (last 3)   Recent Labs  07/21/12 1621 07/21/12 2043 07/22/12 0716  GLUCAP 145* 128* 101*    Wt Readings from Last 3 Encounters:  07/21/12 132.133 kg (291 lb 4.8 oz)  07/13/12 122.471 kg (270 lb)  07/13/12 122.471 kg (270 lb)    Physical Exam:  Constitutional: He is oriented to person, place, and time.  HENT:  Head: Normocephalic.  Eyes: EOM are normal.  Neck: Neck supple. No thyromegaly present.  Cardiovascular:  Cardiac rate controlled  Pulmonary/Chest:  Decreased breath sounds but clear to auscultation  Abdominal: Soft. Bowel sounds are normal. He exhibits no distension.  Neurological: He is alert and oriented to person, place, and time.  Patient follows full commands. Motor function within normal limits in UE's and RLE. Sensory function nl  Skin:  Surgical site is dressed. Left leg remains tender. Wound intact Musc: left leg with persistent edema---- 3+. Non pitting. Psychiatric: he anxious but otherwise His behavior is normal. Judgment and thought content normal  Neuro:  Eyes without evidence of nystagmus  Tone is normal without evidence of spasticity  Cerebellar exam shows no evidence of ataxia on finger nose finger or heel to shin testing  No evidence of trunkal ataxia  Motor strength is 5/5 in bilateral deltoid, biceps, triceps, finger flexors and extensors, wrist flexors and extensors, hip flexors, knee flexors and  extensors, ankle dorsiflexors, plantar flexors, invertors and evertors, toe flexors and extensors  Sensory exam is normal to pinprick, proprioception and light touch in the upper and lower limbs    Assessment/Plan: 1. Functional deficits secondary to distal left femur fx which require 3+ hours per day of interdisciplinary therapy in a comprehensive inpatient rehab setting. Physiatrist is providing close team supervision and 24 hour management of active medical problems listed below. Physiatrist and rehab team continue to assess barriers to discharge/monitor patient progress toward functional and medical goals.  Pain still a major factor in mobility and activity tolerance.  FIM: FIM - Bathing Bathing Steps Patient Completed: Chest;Right Arm;Left Arm;Abdomen;Front perineal area;Right upper leg Bathing: 3: Mod-Patient completes 5-7 26f 10 parts or 50-74%  FIM - Upper Body Dressing/Undressing Upper body dressing/undressing steps patient completed: Thread/unthread right sleeve of pullover shirt/dresss;Thread/unthread left sleeve of pullover shirt/dress;Put head through opening of pull over shirt/dress;Pull shirt over trunk Upper body dressing/undressing: 5: Set-up assist to: Obtain clothing/put away FIM - Lower Body Dressing/Undressing Lower body dressing/undressing steps patient completed: Thread/unthread right pants leg Lower body dressing/undressing: 1: Total-Patient completed less than 25% of tasks  FIM - Toileting Toileting: 1: Total-Patient completed zero steps, helper did all 3  FIM - Diplomatic Services operational officer Devices: Human resources officer Transfers: 4-From toilet/BSC: Min A (steadying Pt. > 75%);4-To toilet/BSC: Min A (steadying Pt. > 75%)  FIM - Banker Devices: Sliding board Bed/Chair Transfer: 3: Sit > Supine: Mod A (lifting  assist/Pt. 50-74%/lift 2 legs);4: Bed > Chair or W/C: Min A (steadying Pt. >  75%);3: Chair or W/C > Bed: Mod A (lift or lower assist)  FIM - Locomotion: Wheelchair Distance: 45' Locomotion: Wheelchair: 1: Total Assistance/staff pushes wheelchair (Pt<25%) FIM - Locomotion: Ambulation Ambulation/Gait Assistance:  (unsafe to perform, pt unable to reach standing) Locomotion: Ambulation: 0: Activity did not occur  Comprehension Comprehension Mode: Auditory Comprehension: 5-Understands complex 90% of the time/Cues < 10% of the time  Expression Expression Mode: Verbal Expression: 7-Expresses complex ideas: With no assist  Social Interaction Social Interaction: 6-Interacts appropriately with others with medication or extra time (anti-anxiety, antidepressant).  Problem Solving Problem Solving: 5-Solves complex 90% of the time/cues < 10% of the time  Memory Memory: 5-Recognizes or recalls 90% of the time/requires cueing < 10% of the time  Medical Problem List and Plan:  1. Left distal femur fracture after fall. Status post ORIF 07/10/2012  2. DVT Prophylaxis/Anticoagulation: Subcutaneous Lovenox. Monitor platelet counts and any signs of bleeding.   -doppler test limited but negative 3. Pain Management: Oxycodone as needed. Monitor with increased mobility   -MS contin has proven helpful--adjust upward today  -femur xr stable 4. Neuropsych: This patient is capable of making decisions on his/her own behalf.  5. Acute blood loss anemia. Latest hemoglobin 7.5. Followup CBC and transfuse as appropriate. Continue Aranesp as advised  6. Chronic kidney disease and renal transplant 1996. Baseline creatinine 3.00. Followup per renal services. Continue prednisone/Sirolimus as directed  7. Hypertension/atrial fibrillation. Amiodarone 200 mg daily, coreg 25 mg twice a day, Lasix 80 mg twice a day, hydralazine 25 mg 3 times a day, M.D. or 60 mg daily. Monitor with increased mobility. Patient no chest pain or shortness of breath  8. Nonischemic cardiomyopathy. Continue aspirin  therapy.  9. Systolic congestive heart failure. Continue Lasix as advised. Monitor for any signs of fluid overload   -may adjust lasix to assist with LLE edema  -LLE needs to be re-wrapped with ACE daily 10. Non-insulin-dependent diabetes mellitus. Hemoglobin A1c 5.7. Blood sugars under fair control at present 11. History of gout. Continue allopurinol. Monitor for any gout flareups.  12. Hyperlipidemia. Lipitor  13. Obstructive sleep apnea. CPAP  14. Leukocytosis: no signs of cellulitis  -urine clean  -IS  -likely steroid effect  LOS (Days) 7 A FACE TO FACE EVALUATION WAS PERFORMED  SWARTZ,ZACHARY T 07/22/2012 8:16 AM

## 2012-07-22 NOTE — Progress Notes (Signed)
PT has been refusing to wear CPAP machine. RT will assist as needed.

## 2012-07-22 NOTE — Progress Notes (Addendum)
Physical Therapy Session Note  Patient Details  Name: Chad [name] MRN: [id number] Date of Birth: 07/09/1955  Today's Date: 07/22/2012 Time: 1430-1520 Time Calculation (min): 50 min  Short Term Goals: Week 1:  PT Short Term Goal 1 (Week 1): Pt will perform sliding board transfers to level surfaces with min assist.  PT Short Term Goal 2 (Week 1): Pt will perform sit <> stand from elevated mat with +2 total assist. PT Short Term Goal 3 (Week 1): Pt will propel wheelchair x 100' with supervision.  Skilled Therapeutic Interventions/Progress Updates:    Pt reports some knee pain from this morning when he sat quickly from parallel bars, pt reports he felt that his knee somewhat twisted which was not reported during session this morning however is important to note. Pt reports his pain is at same level as yesterday and does not feel that an x-ray is necessary. Pt reports "if I thought something was wrong I would let you know" however PT made PA made aware however of episode and pt's pain.   Sliding board transfer with supervision, mod assist for placement and removal of board this session. Pt presents at similar pain level as prior to episode, reports no increased pain with transfer to chair. Wheelchair mobility to pick up laundry for strengthening and conditioning x 120', 170'; wheelchair negotiation in room to fold and put up clothing, all supervision level.  Pt encouraged to sit up for 1 hour and call nursing to get back in bed (no longer than an hour to limit LE swelling) RN made aware. Pt encouraged to finish lunch.     Therapy Documentation Precautions:  Precautions Precautions: Fall Required Braces or Orthoses: Other Brace/Splint Other Brace/Splint: Bledsoe Brace Restrictions Weight Bearing Restrictions: Yes LLE Weight Bearing: Touchdown weight bearing Pain: Pain Assessment Pain Assessment: 0-10 Pain Score:   7 Pain Type: Surgical pain Pain Location: Leg Pain Orientation:  Left Pain Descriptors / Indicators: Aching;Sore Pain Onset: On-going Pain Intervention(s): Repositioned  See FIM for current functional status  Therapy/Group: Individual Therapy  [name] 07/22/2012, 3:40 PM

## 2012-07-22 NOTE — Progress Notes (Signed)
Pt was sitting up bedside when I arrived. His dinner was on his tray and he really had not eaten much; said someone was bringing him something to eat later. Pt was very drowsy during our visit but wanted a visit and tried very hard to stay alert and hold a conversation. 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 Pt said he fell and anticipates going home in 10-15 days. Marjory Lies Chaplain  07/22/12 1800  Clinical Encounter Type  Visited With Patient

## 2012-07-22 NOTE — Progress Notes (Signed)
Occupational Therapy Session Note  Patient Details  Name: Chad [name] MRN: [id number] Date of Birth: 1955-04-02  Today's Date: 07/22/2012  Session 1 Time: 0800-0900 Time Calculation (min): 60 min  Short Term Goals: Week 1:  OT Short Term Goal 1 (Week 1): LB Bath:  Mod assist sitting EOB to include lateral leans and use of AE OT Short Term Goal 2 (Week 1): LB Dressing:  Mod assist to donn pants/shorts sitting EOB to include lateral leans and use of AE OT Short Term Goal 3 (Week 1): Toilet Transfer: Mod Assist for drop arm commode transfers using slide board PRN OT Short Term Goal 4 (Week 1): Toileting: Mod assist with pants down and up using lateral leans OT Short Term Goal 5 (Week 1): BUE strengthening:  Supervision performing HEP  Skilled Therapeutic Interventions/Progress Updates:    Pt engaged in bathing and dressing tasks while seated on BSC for BM.  Pt performed sliding board transfer with supervision/setup requiring assistance only for placement of board and to steady BSC to prevent from sliding on floor.  Pt performed adequate lateral leans to right and left to allow therapist to perform toilet hygiene.  Pt attempted to perform hygiene but was unable to this morning.  Pt initiated pulling up pants while performing lateral leans but was inhibited by pants catching on LLE brace.  Pt stated he will get some pants that are looser fitting.  Pt was able to boost adequately enough for therapist to pull pants up the rest of the way.  Focus on lateral leans, transfers, activity tolerance, bed mobility and safety awareness.  Therapy Documentation Precautions:  Precautions Precautions: Fall Required Braces or Orthoses: Other Brace/Splint Other Brace/Splint: Bledsoe Brace Restrictions Weight Bearing Restrictions: Yes LLE Weight Bearing: Touchdown weight bearing   Pain: Pain Assessment Pain Score:   4 Pain Type: Surgical pain Pain Location: Leg Pain Orientation: Left Pain  Descriptors / Indicators: Aching Pain Onset: On-going Patients Stated Pain Goal: 2 Pain Intervention(s): RN made aware;Repositioned  See FIM for current functional status  Therapy/Group: Individual Therapy  Session 2 Pt missed 45 mins skilled OT services.  Pt c/o 9/10 pain in left knee.  Pt stated he was very groggy and it was noted that his hands were shaking.  Pt had not eaten lunch because he was hurting too much.  RN aware and pain meds administered and dressing changed on left leg.  Pt requested therapist to return after dressing change to apply ice packs to left leg.  [name] 07/22/2012, 9:53 AM

## 2012-07-22 NOTE — Progress Notes (Signed)
Recreational Therapy Session Note  Patient Details  Name: Chad [name] MRN: [id number] Date of Birth: 06/13/1955 Today's Date: 07/22/2012 Pain: 9/10 L knee, RN aware, meds given Skilled Therapeutic Interventions/Progress Updates: Co-treat with OT cancelled due to pain and feeling groggy.  Pt had not eaten lunch due to pain.  RN made aware of the above and addressing. Madaline Lefeber 07/22/2012, 3:25 PM

## 2012-07-22 NOTE — Progress Notes (Signed)
[facility] Individual Statement of Services  Patient Name:  Chad [name]  Date:  07/22/2012  Welcome to the [facility].  Our goal is to provide you with an individualized program based on your diagnosis and situation, designed to meet your specific needs.  With this comprehensive rehabilitation program, you will be expected to participate in at least 3 hours of rehabilitation therapies Monday-Friday, with modified therapy programming on the weekends.  Your rehabilitation program will include the following services:  Physical Therapy (PT), Occupational Therapy (OT), 24 hour per day rehabilitation nursing, Therapeutic Recreaction (TR), Case Management (Social Worker), Rehabilitation Medicine, Nutrition Services and Pharmacy Services  Weekly team conferences will be held on Tuesdays to discuss your progress.  Your Social Worker will talk with you frequently to get your input and to update you on team discussions.  Team conferences with you and your family in attendance may also be held.  Expected length of stay: 2 weeks        Overall anticipated outcome:  Minimal assistance  Depending on your progress and recovery, your program may change. Your Social Worker will coordinate services and will keep you informed of any changes. Your Social Worker's name and contact numbers are listed  below.  The following services may also be recommended but are not provided by the [facility]:   Driving Evaluations  Home Health Rehabiltiation Services  Outpatient Rehabilitatation [facility]  Vocational Rehabilitation   Arrangements will be made to provide these services after discharge if needed.  Arrangements include referral to agencies that provide these services.  Your insurance has been verified to be:  none Your primary doctor is:  Dr. Caryn Section  Pertinent information will be shared with your doctor and your insurance company.  Social  Worker:  [place], [place] [phone] or ([phone]  Information discussed with and copy given to patient by: Amada Jupiter, 07/22/2012, 9:43 AM

## 2012-07-23 ENCOUNTER — Inpatient Hospital Stay (HOSPITAL_COMMUNITY): Payer: Medicaid Other

## 2012-07-23 ENCOUNTER — Inpatient Hospital Stay (HOSPITAL_COMMUNITY): Payer: Medicaid Other | Admitting: *Deleted

## 2012-07-23 ENCOUNTER — Encounter (HOSPITAL_COMMUNITY): Payer: Self-pay

## 2012-07-23 LAB — GLUCOSE, CAPILLARY: Glucose-Capillary: 104 mg/dL — ABNORMAL HIGH (ref 70–99)

## 2012-07-23 NOTE — Progress Notes (Signed)
Subjective/Complaints: Pain control better. Still has moments with therapy where pain spikes. ACE wrap helping edema A 12 point review of systems has been performed and if not noted above is otherwise negative.   Objective: Vital Signs: Blood pressure 129/72, pulse 77, temperature 98.4 F (36.9 C), temperature source Oral, resp. rate 20, weight 126.9 kg (279 lb 12.2 oz), SpO2 97.00%. No results found.  Recent Labs  07/22/12 0545  WBC 15.0*  HGB 8.3*  HCT 26.9*  PLT 260    Recent Labs  07/22/12 0545  NA 134*  K 3.8  CL 96  GLUCOSE 102*  BUN 90*  CREATININE 3.26*  CALCIUM 8.9   CBG (last 3)   Recent Labs  07/22/12 1632 07/22/12 2133 07/23/12 0721  GLUCAP 131* 149* 104*    Wt Readings from Last 3 Encounters:  07/23/12 126.9 kg (279 lb 12.2 oz)  07/13/12 122.471 kg (270 lb)  07/13/12 122.471 kg (270 lb)    Physical Exam:  Constitutional: He is oriented to person, place, and time.  HENT:  Head: Normocephalic.  Eyes: EOM are normal.  Neck: Neck supple. No thyromegaly present.  Cardiovascular:  Cardiac rate controlled  Pulmonary/Chest:  Decreased breath sounds but clear to auscultation  Abdominal: Soft. Bowel sounds are normal. He exhibits no distension.  Neurological: He is alert and oriented to person, place, and time.  Patient follows full commands. Motor function within normal limits in UE's and RLE. Sensory function nl  Skin:  Surgical site is dressed. Left leg remains tender. Wound intact Musc: left leg with persistent edema---- 3+. Non pitting. Psychiatric: he anxious but otherwise His behavior is normal. Judgment and thought content normal  Neuro:  Eyes without evidence of nystagmus  Tone is normal without evidence of spasticity  Cerebellar exam shows no evidence of ataxia on finger nose finger or heel to shin testing  No evidence of trunkal ataxia  Motor strength is 5/5 in bilateral deltoid, biceps, triceps, finger flexors and extensors, wrist  flexors and extensors, hip flexors, knee flexors and extensors, ankle dorsiflexors, plantar flexors, invertors and evertors, toe flexors and extensors  Sensory exam is normal to pinprick, proprioception and light touch in the upper and lower limbs    Assessment/Plan: 1. Functional deficits secondary to distal left femur fx which require 3+ hours per day of interdisciplinary therapy in a comprehensive inpatient rehab setting. Physiatrist is providing close team supervision and 24 hour management of active medical problems listed below. Physiatrist and rehab team continue to assess barriers to discharge/monitor patient progress toward functional and medical goals.    FIM: FIM - Bathing Bathing Steps Patient Completed: Chest;Right Arm;Left Arm;Abdomen;Front perineal area;Right upper leg Bathing: 3: Mod-Patient completes 5-7 83f 10 parts or 50-74%  FIM - Upper Body Dressing/Undressing Upper body dressing/undressing steps patient completed: Thread/unthread right sleeve of pullover shirt/dresss;Thread/unthread left sleeve of pullover shirt/dress;Put head through opening of pull over shirt/dress;Pull shirt over trunk Upper body dressing/undressing: 5: Set-up assist to: Obtain clothing/put away FIM - Lower Body Dressing/Undressing Lower body dressing/undressing steps patient completed: Thread/unthread right pants leg Lower body dressing/undressing: 1: Total-Patient completed less than 25% of tasks  FIM - Toileting Toileting: 1: Total-Patient completed zero steps, helper did all 3  FIM - Diplomatic Services operational officer Devices: Sliding board;Bedside commode Toilet Transfers: 5-To toilet/BSC: Supervision (verbal cues/safety issues);5-From toilet/BSC: Supervision (verbal cues/safety issues)  FIM - Banker Devices: Sliding board Bed/Chair Transfer: 5: Supine > Sit: Supervision (verbal cues/safety issues);4: Sit > Supine:  Min A (steadying pt. >  75%/lift 1 leg);5: Bed > Chair or W/C: Supervision (verbal cues/safety issues);4: Chair or W/C > Bed: Min A (steadying Pt. > 75%)  FIM - Locomotion: Wheelchair Distance: 45' Locomotion: Wheelchair: 5: Travels 150 ft or more: maneuvers on rugs and over door sills with supervision, cueing or coaxing FIM - Locomotion: Ambulation Ambulation/Gait Assistance:  (unsafe to perform, pt unable to reach standing) Locomotion: Ambulation:  (attempted 6/26 but unable to take steps)  Comprehension Comprehension Mode: Auditory Comprehension: 5-Understands complex 90% of the time/Cues < 10% of the time  Expression Expression Mode: Verbal Expression: 7-Expresses complex ideas: With no assist  Social Interaction Social Interaction: 6-Interacts appropriately with others with medication or extra time (anti-anxiety, antidepressant).  Problem Solving Problem Solving: 5-Solves complex 90% of the time/cues < 10% of the time  Memory Memory: 5-Recognizes or recalls 90% of the time/requires cueing < 10% of the time  Medical Problem List and Plan:  1. Left distal femur fracture after fall. Status post ORIF 07/10/2012  2. DVT Prophylaxis/Anticoagulation: Subcutaneous Lovenox. Monitor platelet counts and any signs of bleeding.   -doppler test limited but negative 3. Pain Management: Oxycodone as needed. Monitor with increased mobility   -MS contin increased to 45mg  q12.  -femur xr stable 4. Neuropsych: This patient is capable of making decisions on his/her own behalf.  5. Acute blood loss anemia. Latest hemoglobin 7.5. Followup CBC and transfuse as appropriate. Continue Aranesp as advised  6. Chronic kidney disease and renal transplant 1996. Baseline creatinine 3.00. Followup per renal services. Continue prednisone/Sirolimus as directed  7. Hypertension/atrial fibrillation. Amiodarone 200 mg daily, coreg 25 mg twice a day, Lasix 80 mg twice a day, hydralazine 25 mg 3 times a day, M.D. or 60 mg daily. Monitor  with increased mobility. Patient no chest pain or shortness of breath  8. Nonischemic cardiomyopathy. Continue aspirin therapy.  9. Systolic congestive heart failure. Continue Lasix as advised. Monitor for any signs of fluid overload   -lasix slightly increased to assist with LLE edema  -LLE needs to be re-wrapped with ACE daily 10. Non-insulin-dependent diabetes mellitus. Hemoglobin A1c 5.7. Blood sugars under fair control at present 11. History of gout. Continue allopurinol. Monitor for any gout flareups.  12. Hyperlipidemia. Lipitor  13. Obstructive sleep apnea. CPAP  14. Leukocytosis: no signs of cellulitis  -urine clean  -IS  -likely steroid effect  LOS (Days) 8 A FACE TO FACE EVALUATION WAS PERFORMED  SWARTZ,ZACHARY T 07/23/2012 8:28 AM

## 2012-07-23 NOTE — Progress Notes (Signed)
Physical Therapy Session Note  Patient Details  Name: Chad [name] MRN: [id number] Date of Birth: [birth year]  Today's Date: 07/23/2012 Time: 7846-9629 Time Calculation (min): 43 min  Short Term Goals: Week 1:  PT Short Term Goal 1 (Week 1): Pt will perform sliding board transfers to level surfaces with min assist.  PT Short Term Goal 2 (Week 1): Pt will perform sit <> stand from elevated mat with +2 total assist. PT Short Term Goal 3 (Week 1): Pt will propel wheelchair x 100' with supervision.  Skilled Therapeutic Interventions/Progress Updates:    Therapy session focused on sliding board transfers and w/c propulsion. Upon start of therapy session pt notified PT of drainage from L LE, nursing staff consulted and cleared pt for therapy session as pt was concerned about getting up with wound drainage. Pt performed bed mobility with close supervision-min A utilizing bed rails for B/L rolling while PT assisted donning pants. Pt able to perform sit to supine with min A (L LE management) requiring VC's for UE placement to gain maximum leverage. Pt performed sliding board transfer with min A for L LE management from bed to w/c and cues to maintain NWB status. W/C propulsion performed for approximately 165 ft with S for cardiovascular endurance and to increase activity tolerance. Pt performed sliding board transfer from w/c<>mat with close S and cues for LE placement to maintain NWB status. Pt with copious amounts of serosanguinous drainage from L LE during therapy session. Nursing staff notified and bed change requested secondary to drainage at end of session. Pt with minimal c/o pain during therapy session however stated that he received pain medication 20 mins prior to therapy.   Therapy Documentation Precautions:  Precautions Precautions: Fall Required Braces or Orthoses: Other Brace/Splint Other Brace/Splint: Bledsoe Brace Restrictions Weight Bearing Restrictions: Yes LLE Weight Bearing: Non  weight bearing  See FIM for current functional status  Therapy/Group: Individual Therapy  [place], [place]quilla Goodgame 07/23/2012, 3:51 PM

## 2012-07-23 NOTE — Progress Notes (Signed)
After transferring from w/c to bed, for scheduled dressing change, it was noted that a copious amount of sanguinous drainage was on pt's sheet, prior to removal of ace wrap. After removing ace wrap, and assessing surgical incisions, it was evident that the drainage was not coming from the original surgical incisions. Pt asked to turn on R side, and it was noted that drainage was seeping from an indented area,  Along with what appeared to be a small puncture site to mid calf. Harvel Ricks, PA  notified,  assessed area and felt that it was from 'third tissue spacing'. Order placed for wound consult. Abd pad applied, along with compression wrap to area. Staff to continue to monitor.

## 2012-07-23 NOTE — Progress Notes (Signed)
Physical Therapy Weekly Progress Note  Patient Details  Name: Chad [name] MRN: [id number] Date of Birth: 04/29/1955  Today's Date: 07/23/2012  Patient has met 3 of 3 short term goals.  Pt making good gains despite limitations from pain and medical issues. Pt motivated to participate with therapies.   Patient continues to demonstrate the following deficits: need for assist for set up and performance of transfers, significant Rt. LE weakness, decreased ability to maintain precautions with sit <> stands, elevated need for assist with all areas of self care,  and therefore will continue to benefit from skilled PT intervention to enhance overall performance with activity tolerance, balance, ability to compensate for deficits and knowledge of precautions.  Patient progressing toward long term goals..  Continue plan of care.  PT Short Term Goals Week 1:  PT Short Term Goal 1 (Week 1): Pt will perform sliding board transfers to level surfaces with min assist.  PT Short Term Goal 1 - Progress (Week 1): Met PT Short Term Goal 2 (Week 1): Pt will perform sit <> stand from elevated mat with +2 total assist. PT Short Term Goal 2 - Progress (Week 1): Met PT Short Term Goal 3 (Week 1): Pt will propel wheelchair x 100' with supervision. PT Short Term Goal 3 - Progress (Week 1): Met    Therapy Documentation Precautions:  Precautions Precautions: Fall Required Braces or Orthoses: Other Brace/Splint Other Brace/Splint: Bledsoe Brace Restrictions Weight Bearing Restrictions: Yes LLE Weight Bearing: Non weight bearing   Chad [name] 07/23/2012, 3:57 PM

## 2012-07-23 NOTE — Progress Notes (Signed)
Occupational Therapy Session Note  Patient Details  Name: Chad [name] MRN: [id number] Date of Birth: 08-12-1955  Today's Date: 07/23/2012  Session 1 Time: 0800-0859 Time Calculation (min): 59 min  Short Term Goals: Week 2:  OT Short Term Goal 1 (Week 2): Pt will perform toileting with max A (1/3 tasks) OT Short Term Goal 2 (Week 2): Pt will don/doff pants while seated on BSC with min A OT Short Term Goal 3 (Week 2): Pt will perform simple meal prep with supervision at w/c level OT Short Term Goal 4 (Week 2): Pt will perform simple home mgmt tasks w/c level with supervision  Skilled Therapeutic Interventions/Progress Updates:    Pt engaged in bathing and dressing tasks seated on BSC before transferring back to bed.  Pt completed bed<>drop arm BSC transfers with supervision after board placed.  Therapist steadied BSC during transfer to prevent from slipping on floor.  Pt doffed pants while seated and was able to pull pants up on left leg over buttocks but required assistance with right leg.  Pt was able to thread pants on right leg.  Pt O2 sats dropped to 85% on RA while doffing and donning shirt.  O2 sats recovered to 90%+ on 3L O2 withing 60 seconds.  Focus on activity tolerance, lateral leans, placing sliding board, donning pants while seated on BSC, and transfers.  Therapy Documentation Precautions:  Precautions Precautions: Fall Required Braces or Orthoses: Other Brace/Splint Other Brace/Splint: Bledsoe Brace Restrictions Weight Bearing Restrictions: Yes LLE Weight Bearing: Touchdown weight bearing Pain: Pain Assessment Pain Score:   3 Pain Type: Acute pain Pain Location: Leg Pain Orientation: Left Pain Descriptors / Indicators: Aching Pain Onset: On-going Patients Stated Pain Goal: 2 Pain Intervention(s): RN made aware;Repositioned  See FIM for current functional status  Therapy/Group: Individual Therapy  Session 2 Time: 1000-1030 Pt c/o 10/10 pain in left leg  (anterior mid); RN aware; repositioned and ice pack applied Individual therapy  Focus on bed mobility and bed<>w/c transfers with emphasis on placing board and removing sliding board.  Pt able to place board under either side with min verbal cues for correct placement.  Practiced transfers X 2 at supervision level.  Session 3 Time: 1345-1430 Pt initially c.o 4/10 pain in left leg but escalated to 10/10 during session and with movement; RN aware and present; repositioned Individual Therapy  Focus on bed mobility to facilitate dressing change by RN.  Weeping noted in mid posterior lower leg and RN and PA notified. Pt is able to assist with raising LLE to allow wrapping with ace bandages.  Pt is able to roll side to side with assistance for LLE when rolling to right.  [name] [facility], [place] 07/23/2012, 9:02 AM

## 2012-07-23 NOTE — Progress Notes (Signed)
Occupational Therapy Weekly Progress Note  Patient Details  Name: Chad [name] MRN: [id number] Date of Birth: May 17, 1955  Today's Date: 07/23/2012  Patient has met 5 of 5 short term goals. Pt has made steady progress with UB bathing and dressing, drop arm BSC transfers, LB clothing management, lateral leans, and bed mobility.  Pt continues to experience increased pain with movement and increased swelling when LLE is not elevated which has been a barrier to increased time in w/c.    Patient continues to demonstrate the following deficits: decreased independence with fxal transfers, increased pain, decreased UB endurance, decreased activity tolerance, decreased independence with BADL's/IADL's and therefore will continue to benefit from skilled OT intervention to enhance overall performance with BADL, iADL and Reduce care partner burden.  Patient progressing toward long term goals. Continue plan of care.  OT Short Term Goals Week 1:  OT Short Term Goal 1 (Week 1): LB Bath:  Mod assist sitting EOB to include lateral leans and use of AE OT Short Term Goal 1 - Progress (Week 1): Met OT Short Term Goal 2 (Week 1): LB Dressing:  Mod assist to donn pants/shorts sitting EOB to include lateral leans and use of AE OT Short Term Goal 2 - Progress (Week 1): Met OT Short Term Goal 3 (Week 1): Toilet Transfer: Mod Assist for drop arm commode transfers using slide board PRN OT Short Term Goal 3 - Progress (Week 1): Met OT Short Term Goal 4 (Week 1): Toileting: Mod assist with pants down and up using lateral leans OT Short Term Goal 4 - Progress (Week 1): Met OT Short Term Goal 5 (Week 1): BUE strengthening:  Supervision performing HEP OT Short Term Goal 5 - Progress (Week 1): Met Week 2:  OT Short Term Goal 1 (Week 2): Pt will perform toileting with max A (1/3 tasks) OT Short Term Goal 2 (Week 2): Pt will don/doff pants while seated on BSC with min A OT Short Term Goal 3 (Week 2): Pt will perform simple  meal prep with supervision at w/c level OT Short Term Goal 4 (Week 2): Pt will perform simple home mgmt tasks w/c level with supervision  Therapy Documentation Precautions:  Precautions Precautions: Fall Required Braces or Orthoses: Other Brace/Splint Other Brace/Splint: Bledsoe Brace Restrictions Weight Bearing Restrictions: Yes LLE Weight Bearing: Touchdown weight bearing  See FIM for current functional status  Therapy/Group: Individual Therapy  [name] 07/23/2012, 8:31 AM

## 2012-07-23 NOTE — Progress Notes (Signed)
I reviewed and agree with treatment provided. Perlie Mayo, PT, DPT

## 2012-07-24 ENCOUNTER — Inpatient Hospital Stay (HOSPITAL_COMMUNITY): Payer: Medicaid Other

## 2012-07-24 ENCOUNTER — Inpatient Hospital Stay (HOSPITAL_COMMUNITY): Payer: Medicaid Other | Admitting: Physical Therapy

## 2012-07-24 DIAGNOSIS — S72453A Displaced supracondylar fracture without intracondylar extension of lower end of unspecified femur, initial encounter for closed fracture: Secondary | ICD-10-CM

## 2012-07-24 LAB — COMPREHENSIVE METABOLIC PANEL
ALT: 9 U/L (ref 0–53)
AST: 28 U/L (ref 0–37)
Albumin: 2.3 g/dL — ABNORMAL LOW (ref 3.5–5.2)
CO2: 28 mEq/L (ref 19–32)
Calcium: 9.1 mg/dL (ref 8.4–10.5)
Chloride: 93 mEq/L — ABNORMAL LOW (ref 96–112)
GFR calc non Af Amer: 17 mL/min — ABNORMAL LOW (ref >90)
Sodium: 134 mEq/L — ABNORMAL LOW (ref 135–145)

## 2012-07-24 LAB — CBC WITH DIFFERENTIAL/PLATELET
Basophils Relative: 1 % (ref 0–1)
Eosinophils Absolute: 0.3 10*3/uL (ref 0.0–0.7)
Eosinophils Relative: 2 % (ref 0–5)
Hemoglobin: 8.8 g/dL — ABNORMAL LOW (ref 13.0–17.0)
Lymphs Abs: 1.6 10*3/uL (ref 0.7–4.0)
MCH: 27.2 pg (ref 26.0–34.0)
MCHC: 29.7 g/dL — ABNORMAL LOW (ref 30.0–36.0)
MCV: 91.6 fL (ref 78.0–100.0)
Monocytes Absolute: 0.7 10*3/uL (ref 0.1–1.0)
Neutro Abs: 10.3 10*3/uL — ABNORMAL HIGH (ref 1.7–7.7)
Neutrophils Relative %: 80 % — ABNORMAL HIGH (ref 43–77)
RBC: 3.23 MIL/uL — ABNORMAL LOW (ref 4.22–5.81)

## 2012-07-24 LAB — GLUCOSE, CAPILLARY
Glucose-Capillary: 101 mg/dL — ABNORMAL HIGH (ref 70–99)
Glucose-Capillary: 106 mg/dL — ABNORMAL HIGH (ref 70–99)
Glucose-Capillary: 114 mg/dL — ABNORMAL HIGH (ref 70–99)

## 2012-07-24 MED ORDER — MORPHINE SULFATE ER 30 MG PO TBCR
30.0000 mg | EXTENDED_RELEASE_TABLET | Freq: Two times a day (BID) | ORAL | Status: DC
  Administered 2012-07-24 – 2012-08-02 (×19): 30 mg via ORAL
  Filled 2012-07-24 (×19): qty 1

## 2012-07-24 MED ORDER — MORPHINE SULFATE ER 30 MG PO TBCR: 30.0000 mg | EXTENDED_RELEASE_TABLET | Freq: Two times a day (BID) | ORAL | Status: DC

## 2012-07-24 NOTE — Progress Notes (Signed)
Physical Therapy Session Note  Patient Details  Name: Chad [name] MRN: [id number] Date of Birth: [birth year]  Today's Date: 07/24/2012 Time: 7829-5621 Time Calculation (min): 45 min  Short Term Goals: Week 1:  PT Short Term Goal 1 (Week 1): Pt will perform sliding board transfers to level surfaces with min assist.  PT Short Term Goal 1 - Progress (Week 1): Met PT Short Term Goal 2 (Week 1): Pt will perform sit <> stand from elevated mat with +2 total assist. PT Short Term Goal 2 - Progress (Week 1): Met PT Short Term Goal 3 (Week 1): Pt will propel wheelchair x 100' with supervision. PT Short Term Goal 3 - Progress (Week 1): Met  Therapy Documentation Precautions:  Precautions Precautions: Fall Required Braces or Orthoses: Other Brace/Splint Other Brace/Splint: Bledsoe Brace Restrictions Weight Bearing Restrictions: Yes LLE Weight Bearing: Non weight bearing Vital Signs: Therapy Vitals Temp: 98.2 F (36.8 C) Temp src: Oral Pulse Rate: 85 Resp: 18 BP: 125/75 mmHg Patient Position, if appropriate: Sitting Oxygen Therapy SpO2: 93 % O2 Device: Nasal cannula O2 Flow Rate (L/min): 2 L/min Pain: L LE 6/10  Therapeutic Activity:(15') Bed Mobility supine to sitting EOB with min-A for L LE lower to the floor. Transfer training using slideboard bed<->w/c with min-assist. Therapeutic Exercise:(15') R LE exercises in sitting with 2# ankle weight Wheelchair Management:(15') propelling w/c 2 x 200' independently but needing assist for leg rest management.   Therapy/Group: Individual Therapy  Rex Kras 07/24/2012, 3:48 PM

## 2012-07-24 NOTE — Progress Notes (Signed)
Pt refuses CPAP, and knows he can call if he changes his mind. RT will continue to monitor.

## 2012-07-24 NOTE — Progress Notes (Signed)
Patient ID: Chad [name], male   DOB: 1955-02-18, 57 y.o.   MRN: [id number] Subjective/Complaints: Nausea and vomiting x2 this morning. No abdominal pain. Last b.m. Yesterday. No other concerns. Overall feels good. Pain under good control A 12 point review of systems has been performed and if not noted above is otherwise negative.   Objective: Vital Signs: Blood pressure 139/81, pulse 81, temperature 98.7 F (37.1 C), temperature source Oral, resp. rate 20, weight 126.9 kg (279 lb 12.2 oz), SpO2 92.00%. No results found.  Recent Labs  07/22/12 0545  WBC 15.0*  HGB 8.3*  HCT 26.9*  PLT 260    Recent Labs  07/22/12 0545  NA 134*  K 3.8  CL 96  GLUCOSE 102*  BUN 90*  CREATININE 3.26*  CALCIUM 8.9   CBG (last 3)   Recent Labs  07/23/12 1640 07/23/12 2058 07/24/12 0729  GLUCAP 121* 113* 101*    Wt Readings from Last 3 Encounters:  07/23/12 126.9 kg (279 lb 12.2 oz)  07/13/12 122.471 kg (270 lb)  07/13/12 122.471 kg (270 lb)    Physical Exam:  Constitutional: He is oriented to person, place, and time.  HENT:  Head: Normocephalic.  Eyes: EOM are normal.  Neck: Neck supple. No thyromegaly present.  Cardiovascular:  Cardiac rate controlled  Pulmonary/Chest:  Decreased breath sounds but clear to auscultation  Abdominal: Soft. Bowel sounds are normal. He exhibits no distension.  Neurological: He is alert and oriented to person, place, and time.  Patient follows full commands. Motor function within normal limits in UE's and RLE. Sensory function nl  Skin:  Surgical site is dressed. Left leg remains tender. Wound intact Musc: left leg with persistent edema---- 3+. Non pitting. Psychiatric: he anxious but otherwise His behavior is normal. Judgment and thought content normal  Neuro:  Eyes without evidence of nystagmus  Tone is normal without evidence of spasticity  Cerebellar exam shows no evidence of ataxia on finger nose finger or heel to shin testing  No  evidence of trunkal ataxia  Motor strength is 5/5 in bilateral deltoid, biceps, triceps, finger flexors and extensors, wrist flexors and extensors, hip flexors, knee flexors and extensors, ankle dorsiflexors, plantar flexors, invertors and evertors, toe flexors and extensors  Sensory exam is normal to pinprick, proprioception and light touch in the upper and lower limbs    Assessment/Plan: 1. Functional deficits secondary to distal left femur fx which require 3+ hours per day of interdisciplinary therapy in a comprehensive inpatient rehab setting. Physiatrist is providing close team supervision and 24 hour management of active medical problems listed below. Physiatrist and rehab team continue to assess barriers to discharge/monitor patient progress toward functional and medical goals.    FIM: FIM - Bathing Bathing Steps Patient Completed: Chest;Right Arm;Left Arm;Abdomen;Front perineal area;Right upper leg Bathing: 3: Mod-Patient completes 5-7 59f 10 parts or 50-74%  FIM - Upper Body Dressing/Undressing Upper body dressing/undressing steps patient completed: Thread/unthread right sleeve of pullover shirt/dresss;Thread/unthread left sleeve of pullover shirt/dress;Put head through opening of pull over shirt/dress;Pull shirt over trunk Upper body dressing/undressing: 5: Set-up assist to: Obtain clothing/put away FIM - Lower Body Dressing/Undressing Lower body dressing/undressing steps patient completed: Thread/unthread right pants leg Lower body dressing/undressing: 1: Total-Patient completed less than 25% of tasks  FIM - Toileting Toileting steps completed by patient: Adjust clothing prior to toileting Toileting: 2: Max-Patient completed 1 of 3 steps  FIM - Diplomatic Services operational officer Devices: Sliding board;Bedside commode Toilet Transfers: 5-To toilet/BSC: Supervision (verbal  cues/safety issues);5-From toilet/BSC: Supervision (verbal cues/safety issues)  FIM - Network engineer Assistive Devices: HOB elevated;Bed rails;Sliding board;Arm rests Bed/Chair Transfer: 4: Sit > Supine: Min A (steadying pt. > 75%/lift 1 leg);5: Chair or W/C > Bed: Supervision (verbal cues/safety issues);4: Bed > Chair or W/C: Min A (steadying Pt. > 75%)  FIM - Locomotion: Wheelchair Distance: 165 ft Locomotion: Wheelchair: 5: Travels 150 ft or more: maneuvers on rugs and over door sills with supervision, cueing or coaxing FIM - Locomotion: Ambulation Ambulation/Gait Assistance:  (unsafe to perform, pt unable to reach standing) Locomotion: Ambulation:  (attempted 6/26 but unable to take steps)  Comprehension Comprehension Mode: Auditory Comprehension: 5-Understands complex 90% of the time/Cues < 10% of the time  Expression Expression Mode: Verbal Expression: 7-Expresses complex ideas: With no assist  Social Interaction Social Interaction: 6-Interacts appropriately with others with medication or extra time (anti-anxiety, antidepressant).  Problem Solving Problem Solving: 5-Solves complex 90% of the time/cues < 10% of the time  Memory Memory: 5-Recognizes or recalls 90% of the time/requires cueing < 10% of the time  Medical Problem List and Plan:  1. Left distal femur fracture after fall. Status post ORIF 07/10/2012  2. DVT Prophylaxis/Anticoagulation: Subcutaneous Lovenox. Monitor platelet counts and any signs of bleeding.   -doppler test limited but negative 3. Pain Management: Oxycodone as needed. Monitor with increased mobility   -MS contin increased to 45mg  q12.  -femur xr stable 4. Neuropsych: This patient is capable of making decisions on his/her own behalf.  5. Acute blood loss anemia. Latest hemoglobin 7.5. Followup CBC and transfuse as appropriate. Continue Aranesp as advised  6. Chronic kidney disease and renal transplant 1996. Baseline creatinine 3.00. Followup per renal services. Continue prednisone/Sirolimus as directed  7.  Hypertension/atrial fibrillation. Amiodarone 200 mg daily, coreg 25 mg twice a day, Lasix 80 mg twice a day, hydralazine 25 mg 3 times a day, M.D. or 60 mg daily. Monitor with increased mobility. Patient no chest pain or shortness of breath  8. Nonischemic cardiomyopathy. Continue aspirin therapy.  9. Systolic congestive heart failure. Continue Lasix as advised. Monitor for any signs of fluid overload   -lasix slightly increased to assist with LLE edema  -LLE needs to be re-wrapped with ACE daily 10. Non-insulin-dependent diabetes mellitus. Hemoglobin A1c 5.7. Blood sugars under fair control at present 11. History of gout. Continue allopurinol. Monitor for any gout flareups.  12. Hyperlipidemia. Lipitor  13. Obstructive sleep apnea. CPAP  14. Leukocytosis: no signs of cellulitis  -urine clean  -IS  -likely steroid effect 15.  Nausea- likely med related, morphine increase 2 days ago, given poor renal function there may be reduced  Metabolism with accumulation of metabolic by products, will reduce dose , consider change to Oxy CR, also check CMET , KUB to eval for ileus, f/u CBC LOS (Days) 9 A FACE TO FACE EVALUATION WAS PERFORMED  KIRSTEINS,ANDREW E 07/24/2012 9:08 AM

## 2012-07-25 ENCOUNTER — Inpatient Hospital Stay (HOSPITAL_COMMUNITY): Payer: Medicaid Other | Admitting: Physical Therapy

## 2012-07-25 DIAGNOSIS — S72453A Displaced supracondylar fracture without intracondylar extension of lower end of unspecified femur, initial encounter for closed fracture: Secondary | ICD-10-CM

## 2012-07-25 LAB — GLUCOSE, CAPILLARY
Glucose-Capillary: 93 mg/dL (ref 70–99)
Glucose-Capillary: 111 mg/dL — ABNORMAL HIGH (ref 70–99)

## 2012-07-25 NOTE — Progress Notes (Signed)
Orthopedic Tech Progress Note Patient Details:  Chad [name] [birth year] [id number]  Ortho Devices Type of Ortho Device: Radio broadcast assistant Ortho Device/Splint Location: LLE Ortho Device/Splint Interventions: Ordered;Application   [name] 07/25/2012, 6:54 PM

## 2012-07-25 NOTE — Progress Notes (Signed)
Patient ID: Chad [name], male   DOB: Sep 30, 1955, 57 y.o.   MRN: [id number] Subjective/Complaints: No Nausea or vomiting this am Morphine reduced yesterday.  Pain is ok, No other concerns. Overall feels good. Pain under good control A 12 point review of systems has been performed and if not noted above is otherwise negative.   Objective: Vital Signs: Blood pressure 148/84, pulse 80, temperature 98.5 F (36.9 C), temperature source Oral, resp. rate 20, weight 125.5 kg (276 lb 10.8 oz), SpO2 94.00%. Dg Abd 1 View  07/24/2012   *RADIOLOGY REPORT*  Clinical Data: Nausea postoperatively.  ABDOMEN - 1 VIEW  Comparison: None.  Findings: The bowel, soft tissue planes and bony structures show no acute findings.  There is no ileus or inflammatory change.  Left total hip prosthesis.  IMPRESSION: No acute findings.   Original Report Authenticated By: Sander Radon, M.D.    Recent Labs  07/24/12 1205  WBC 13.0*  HGB 8.8*  HCT 29.6*  PLT 233    Recent Labs  07/24/12 1205  NA 134*  K 3.7  CL 93*  GLUCOSE 103*  BUN 95*  CREATININE 3.65*  CALCIUM 9.1   CBG (last 3)   Recent Labs  07/24/12 1638 07/24/12 2118 07/25/12 0740  GLUCAP 121* 114* 93    Wt Readings from Last 3 Encounters:  07/25/12 125.5 kg (276 lb 10.8 oz)  07/13/12 122.471 kg (270 lb)  07/13/12 122.471 kg (270 lb)    Physical Exam:  Constitutional: He is oriented to person, place, and time.  HENT:  Head: Normocephalic.  Eyes: EOM are normal.  Neck: Neck supple. No thyromegaly present.  Cardiovascular:  Cardiac rate controlled  Pulmonary/Chest:  Decreased breath sounds but clear to auscultation  Abdominal: Soft. Bowel sounds are normal. He exhibits no distension.  Neurological: He is alert and oriented to person, place, and time.  Patient follows full commands. Motor function within normal limits in UE's and RLE. Sensory function nl  Skin:  Surgical site is dressed. Left leg remains tender.  Musc: left leg  with persistent edema---- 3+. Non pitting. Psychiatric: he anxious but otherwise His behavior is normal. Judgment and thought content normal  Neuro:  Eyes without evidence of nystagmus  Tone is normal without evidence of spasticity  Cerebellar exam shows no evidence of ataxia on finger nose finger or heel to shin testing  No evidence of trunkal ataxia  Motor strength is 5/5 in bilateral deltoid, biceps, triceps, finger flexors and extensors, wrist flexors and extensors, hip flexors, knee flexors and extensors, ankle dorsiflexors, plantar flexors, invertors and evertors, toe flexors and extensors  Sensory exam is normal to pinprick, proprioception and light touch in the upper and lower limbs    Assessment/Plan: 1. Functional deficits secondary to distal left femur fx which require 3+ hours per day of interdisciplinary therapy in a comprehensive inpatient rehab setting. Physiatrist is providing close team supervision and 24 hour management of active medical problems listed below. Physiatrist and rehab team continue to assess barriers to discharge/monitor patient progress toward functional and medical goals.    FIM: FIM - Bathing Bathing Steps Patient Completed: Chest;Right Arm;Left Arm;Abdomen;Front perineal area;Right upper leg Bathing: 3: Mod-Patient completes 5-7 66f 10 parts or 50-74%  FIM - Upper Body Dressing/Undressing Upper body dressing/undressing steps patient completed: Thread/unthread right sleeve of pullover shirt/dresss;Thread/unthread left sleeve of pullover shirt/dress;Put head through opening of pull over shirt/dress;Pull shirt over trunk Upper body dressing/undressing: 5: Set-up assist to: Obtain clothing/put away FIM - Lower  Body Dressing/Undressing Lower body dressing/undressing steps patient completed: Thread/unthread right pants leg Lower body dressing/undressing: 1: Total-Patient completed less than 25% of tasks  FIM - Toileting Toileting steps completed by patient:  Adjust clothing prior to toileting Toileting: 2: Max-Patient completed 1 of 3 steps  FIM - Diplomatic Services operational officer Devices: Sliding board;Bedside commode Toilet Transfers: 5-To toilet/BSC: Supervision (verbal cues/safety issues);5-From toilet/BSC: Supervision (verbal cues/safety issues)  FIM - Press photographer Assistive Devices: HOB elevated;Bed rails;Sliding board;Arm rests Bed/Chair Transfer: 4: Sit > Supine: Min A (steadying pt. > 75%/lift 1 leg);5: Chair or W/C > Bed: Supervision (verbal cues/safety issues);4: Bed > Chair or W/C: Min A (steadying Pt. > 75%)  FIM - Locomotion: Wheelchair Distance: 165 ft Locomotion: Wheelchair: 5: Travels 150 ft or more: maneuvers on rugs and over door sills with supervision, cueing or coaxing FIM - Locomotion: Ambulation Ambulation/Gait Assistance:  (unsafe to perform, pt unable to reach standing) Locomotion: Ambulation:  (attempted 6/26 but unable to take steps)  Comprehension Comprehension Mode: Auditory Comprehension: 5-Understands complex 90% of the time/Cues < 10% of the time  Expression Expression Mode: Verbal Expression: 7-Expresses complex ideas: With no assist  Social Interaction Social Interaction: 6-Interacts appropriately with others with medication or extra time (anti-anxiety, antidepressant).  Problem Solving Problem Solving: 5-Solves basic problems: With no assist  Memory Memory: 6-More than reasonable amt of time  Medical Problem List and Plan:  1. Left distal femur fracture after fall. Status post ORIF 07/10/2012  2. DVT Prophylaxis/Anticoagulation: Subcutaneous Lovenox. Monitor platelet counts and any signs of bleeding.   -doppler test limited but negative 3. Pain Management: Oxycodone as needed. Monitor with increased mobility   -MS contin decreased to 30mg  q12.  -femur xr stable 4. Neuropsych: This patient is capable of making decisions on his/her own behalf.  5. Acute blood  loss anemia. Latest hemoglobin 7.5. Followup CBC and transfuse as appropriate. Continue Aranesp as advised  6. Chronic kidney disease and renal transplant 1996. Baseline creatinine 3.00. Followup per renal services. Continue prednisone/Sirolimus as directed  7. Hypertension/atrial fibrillation. Amiodarone 200 mg daily, coreg 25 mg twice a day, Lasix 80 mg twice a day, hydralazine 25 mg 3 times a day, M.D. or 60 mg daily. Monitor with increased mobility. Patient no chest pain or shortness of breath  8. Nonischemic cardiomyopathy. Continue aspirin therapy.  9. Systolic congestive heart failure. Continue Lasix as advised. Monitor for any signs of fluid overload   -lasix slightly increased to assist with LLE edema  -LLE needs to be re-wrapped with ACE daily 10. Non-insulin-dependent diabetes mellitus. Hemoglobin A1c 5.7. Blood sugars under fair control at present 11. History of gout. Continue allopurinol. Monitor for any gout flareups.  12. Hyperlipidemia. Lipitor  13. Obstructive sleep apnea. CPAP  14. Leukocytosis: no signs of cellulitis  -urine clean  -IS  -likely steroid effect 15.  Nausea- likely med related, morphine increase 2 days ago, given poor renal function there may be reduced  Metabolism with accumulation of metabolic by products, will reduce dose , consider change to Oxy CR, BMET stable , KUB negative for ileus, f/u CBC-WBC improving (13K) LOS (Days) 10 A FACE TO FACE EVALUATION WAS PERFORMED  Erick Colace 07/25/2012 9:39 AM

## 2012-07-25 NOTE — Progress Notes (Signed)
Pt refusing CPAP tonight.  Pt told to notify RT if he changes his mind.

## 2012-07-25 NOTE — Consult Note (Signed)
WOC consult Note Reason for Consult:Consideration of compression wrap; patient has worn Unna's boots in the past and post injury, LE and anterior foot are with edema. Two closely approximated incisions with staples are noted on the left proximal LE, once 6cm in length and one 2cm in length.  Patient will need follow-up order for staple removal in desired time (usually 7-10 days post placement). No erythema or induration at sites. Small, 4cm round wound at posterior calf, scant exudate on old dressing, wound bed, pink, moist.  No periwound erythema. Wound type:Vascular insufficiency Pressure Ulcer POA: No Measurement: As above Wound bed:As above Drainage (amount, consistency, odor) As above. Periwound: intact, clear Dressing procedure/placement/frequency: Soft silicone foam dressings over approximated incisions and posterior calf wound.  Unna's boots per ortho tech twice weekly (on Sundays and Wednesdays), top with 4-inch Coban. I will not follow.  Please re-consult if needed. Thanks, Ladona Mow, MSN, RN, [facility], CWOCN [phone])

## 2012-07-25 NOTE — Progress Notes (Signed)
Physical Therapy Note  Patient Details  Name: VIRAT PRATHER MRN: [id number] Date of Birth: 10-08-1955 Today's Date: 07/25/2012  5284-1324 (40 minutes) individual Pain: 3/10 LT LE/ premedicated Focus of treatment: therapeutic activities focused on transfers/ preparation for transfers Treatment: Pt sitting edge of bed upon arrival performing sponge bath; pt able to complete sponge bath in sitting with setup +sideways leans for hygiene; pt independently donned shirt and needed assist to thread slacks over LEs and complete pulling up slacks; pt self assisted nurse by managing LE in sitting while nurse performed dressing changes; transfer bed to recliner (level) with setup only and close SBA for safety. Pt left in recliner with call device within reach.    Mayola Mcbain,JIM 07/25/2012, 10:28 AM

## 2012-07-26 ENCOUNTER — Inpatient Hospital Stay (HOSPITAL_COMMUNITY): Payer: Medicaid Other | Admitting: Occupational Therapy

## 2012-07-26 ENCOUNTER — Inpatient Hospital Stay (HOSPITAL_COMMUNITY): Payer: Medicaid Other | Admitting: *Deleted

## 2012-07-26 ENCOUNTER — Inpatient Hospital Stay (HOSPITAL_COMMUNITY): Payer: Medicaid Other

## 2012-07-26 DIAGNOSIS — S72453A Displaced supracondylar fracture without intracondylar extension of lower end of unspecified femur, initial encounter for closed fracture: Secondary | ICD-10-CM

## 2012-07-26 LAB — GLUCOSE, CAPILLARY
Glucose-Capillary: 110 mg/dL — ABNORMAL HIGH (ref 70–99)
Glucose-Capillary: 127 mg/dL — ABNORMAL HIGH (ref 70–99)
Glucose-Capillary: 113 mg/dL — ABNORMAL HIGH (ref 70–99)

## 2012-07-26 NOTE — Plan of Care (Signed)
Problem: RH BOWEL ELIMINATION Goal: RH STG MANAGE BOWEL W/MEDICATION W/ASSISTANCE STG Manage Bowel with Medication with Min Assistance.  Outcome: Not Progressing LBM 07-23-12

## 2012-07-26 NOTE — Progress Notes (Signed)
Physical Therapy Session Note  Patient Details  Name: Chad [name] MRN: [id number] Date of Birth: Oct 24, 1955  Today's Date: 07/26/2012 Time: 4696-2952 Time Calculation (min): 56 min   Skilled Therapeutic Interventions/Progress Updates:     Patient received sitting in wheelchair. Session focused on slideboard transfers, bed mobility from flat surface (no rails), and wheelchair propulsion on both tile and carpeted surfaces; see details below. Patient able to complete slideboard transfers wheelchair<>slightly elevated mat with supervision, able to perform set up and position slideboard independently, requires supervision for transfer itself and requires stabilization of wheelchair. Wheelchair mobility performed x250' with supervision (75' on carpet). Patient returned to room and left seated in wheelchair with all needs within reach.  Therapy Documentation Precautions:  Precautions Precautions: Fall Required Braces or Orthoses: Other Brace/Splint Other Brace/Splint: Bledsoe Brace Restrictions Weight Bearing Restrictions: Yes LLE Weight Bearing: Touchdown weight bearing Vital Signs: Therapy Vitals Pulse Rate: 72 Patient Position, if appropriate: Sitting Oxygen Therapy SpO2: 94 % O2 Device: None (Room air) O2 Flow Rate (L/min): 2.5 L/min Pain: Pain Assessment Pain Assessment: No/denies pain Pain Score: 0-No pain Locomotion : Ambulation Ambulation/Gait Assistance: Not tested (comment) Wheelchair Mobility Wheelchair Mobility: Yes Wheelchair Assistance: 5: Supervision Wheelchair Assistance Details: Verbal cues for Engineer, drilling: Both upper extremities Wheelchair Parts Management: Supervision/cueing Distance: 250   See FIM for current functional status  Therapy/Group: Individual Therapy  Chipper Herb. Darneshia Demary, PT, DPT  07/26/2012, 4:25 PM

## 2012-07-26 NOTE — Progress Notes (Signed)
Occupational Therapy Session Notes  Patient Details  Name: Chad [name] MRN: [id number] Date of Birth: 21-Feb-1955  Today's Date: 07/26/2012  Short Term Goals: Week 1:  OT Short Term Goal 1 (Week 1): LB Bath:  Mod assist sitting EOB to include lateral leans and use of AE OT Short Term Goal 1 - Progress (Week 1): Met OT Short Term Goal 2 (Week 1): LB Dressing:  Mod assist to donn pants/shorts sitting EOB to include lateral leans and use of AE OT Short Term Goal 2 - Progress (Week 1): Met OT Short Term Goal 3 (Week 1): Toilet Transfer: Mod Assist for drop arm commode transfers using slide board PRN OT Short Term Goal 3 - Progress (Week 1): Met OT Short Term Goal 4 (Week 1): Toileting: Mod assist with pants down and up using lateral leans OT Short Term Goal 4 - Progress (Week 1): Met OT Short Term Goal 5 (Week 1): BUE strengthening:  Supervision performing HEP OT Short Term Goal 5 - Progress (Week 1): Met Week 2:  OT Short Term Goal 1 (Week 2): Pt will perform toileting with max A (1/3 tasks) OT Short Term Goal 2 (Week 2): Pt will don/doff pants while seated on BSC with min A OT Short Term Goal 3 (Week 2): Pt will perform simple meal prep with supervision at w/c level OT Short Term Goal 4 (Week 2): Pt will perform simple home mgmt tasks w/c level with supervision  Skilled Therapeutic Interventions/Progress Updates:  Session Note #1: Time: 1030-1130 (60 mins) Pt reported 5/10 pain in inner L leg. Pt stated he recently received pain meds.  Upon entering room, pt seated in w/c and c/o nausea. RN notified. Pt completed bathing/dressing tasks from w/c level. Pt attempted to use lat leans for pulling up pants but unsuccessful. Pt was able to bridge hips from w/c and OT pull pants up. Pt required min v.c's for dressing technique (dressing L leg first). Following this, pt folded laundry and placed in dresser focusing on activity tolerance, IADL completion, and w/c mobility. Pt takes more than a  reasonable amount of time to complete tasks. Pt on 3.5 L O2 via nasal cannula. O2 stats checked intermittently. When pt removed O2 for donning shirt, O2 stats fell to 83% on room air. Pt educated on pursed-lip breathing when O2 stats fell below 90%. OT elevated LLE on chair and pillow. At end of tx session, pt seated in w/c with call bell and phone within reach.   Session Note #2: Time: 1415-1455 (40 mins) Pt with no report of pain.  Upon entering room, pt seated in w/c. Pt propelled self->gym using BUE's (~175 ft). Pt transferred w/c<>therapy mat using slide board (supervision). From here skilled intervention focused on fxal transfers with focus on pt placing slide board, lat leans to increase independence with LB ADL's/slide board placement, BUE strengthening/endurance, and overall activity tolerance. Pt's O2 stats checked intermittently and ranged from 88%-95%. Pt on 3L O2. When O2 stats fell below 90%, pt educated on pursed-lip breathing. At end of tx session, pt seated in w/c for next PT session with LLE elevated.  Therapy Documentation Precautions:  Precautions Precautions: Fall Required Braces or Orthoses: Other Brace/Splint Other Brace/Splint: Bledsoe Brace Restrictions Weight Bearing Restrictions: Yes LLE Weight Bearing: Touchdown weight bearing  See FIM for current functional status  Therapy/Group: Individual Therapy  Shanequa Whitenight 07/26/2012, 7:28 AM

## 2012-07-26 NOTE — Progress Notes (Signed)
Weekly note reviewed and accurately reflects weekly progress.   

## 2012-07-26 NOTE — Progress Notes (Signed)
Patient ID: Chad [name], male   DOB: 1955/02/29, 57 y.o.   MRN: [id number] Subjective/Complaints: Pain controlled.  No nausea. Swelling down in left leg. A 12 point review of systems has been performed and if not noted above is otherwise negative.   Objective: Vital Signs: Blood pressure 135/74, pulse 76, temperature 98.5 F (36.9 C), temperature source Oral, resp. rate 18, weight 131.6 kg (290 lb 2 oz), SpO2 95.00%. Dg Abd 1 View  07/24/2012   *RADIOLOGY REPORT*  Clinical Data: Nausea postoperatively.  ABDOMEN - 1 VIEW  Comparison: None.  Findings: The bowel, soft tissue planes and bony structures show no acute findings.  There is no ileus or inflammatory change.  Left total hip prosthesis.  IMPRESSION: No acute findings.   Original Report Authenticated By: Sander Radon, M.D.    Recent Labs  07/24/12 1205  WBC 13.0*  HGB 8.8*  HCT 29.6*  PLT 233    Recent Labs  07/24/12 1205  NA 134*  K 3.7  CL 93*  GLUCOSE 103*  BUN 95*  CREATININE 3.65*  CALCIUM 9.1   CBG (last 3)   Recent Labs  07/25/12 1648 07/25/12 2041 07/26/12 0728  GLUCAP 143* 124* 95    Wt Readings from Last 3 Encounters:  07/26/12 131.6 kg (290 lb 2 oz)  07/13/12 122.471 kg (270 lb)  07/13/12 122.471 kg (270 lb)    Physical Exam:  Constitutional: He is oriented to person, place, and time.  HENT:  Head: Normocephalic.  Eyes: EOM are normal.  Neck: Neck supple. No thyromegaly present.  Cardiovascular:  Cardiac rate controlled  Pulmonary/Chest:  Decreased breath sounds but clear to auscultation  Abdominal: Soft. Bowel sounds are normal. He exhibits no distension.  Neurological: He is alert and oriented to person, place, and time.  Patient follows full commands. Motor function within normal limits in UE's and RLE. Sensory function nl  Skin:  Surgical site is dressed. Left leg remains tender.  Musc: left leg with persistent edema---- 3+. Non pitting. Psychiatric: he anxious but otherwise  His behavior is normal. Judgment and thought content normal  Neuro:  Eyes without evidence of nystagmus  Tone is normal without evidence of spasticity  Cerebellar exam shows no evidence of ataxia on finger nose finger or heel to shin testing  No evidence of trunkal ataxia  Motor strength is 5/5 in bilateral deltoid, biceps, triceps, finger flexors and extensors, wrist flexors and extensors, hip flexors, knee flexors and extensors, ankle dorsiflexors, plantar flexors, invertors and evertors, toe flexors and extensors  Sensory exam is normal to pinprick, proprioception and light touch in the upper and lower limbs    Assessment/Plan: 1. Functional deficits secondary to distal left femur fx which require 3+ hours per day of interdisciplinary therapy in a comprehensive inpatient rehab setting. Physiatrist is providing close team supervision and 24 hour management of active medical problems listed below. Physiatrist and rehab team continue to assess barriers to discharge/monitor patient progress toward functional and medical goals.    FIM: FIM - Bathing Bathing Steps Patient Completed: Chest;Right Arm;Left Arm;Abdomen;Front perineal area;Right upper leg Bathing: 3: Mod-Patient completes 5-7 31f 10 parts or 50-74%  FIM - Upper Body Dressing/Undressing Upper body dressing/undressing steps patient completed: Thread/unthread right sleeve of pullover shirt/dresss;Thread/unthread left sleeve of pullover shirt/dress;Put head through opening of pull over shirt/dress;Pull shirt over trunk Upper body dressing/undressing: 5: Set-up assist to: Obtain clothing/put away FIM - Lower Body Dressing/Undressing Lower body dressing/undressing steps patient completed: Thread/unthread right pants leg Lower  body dressing/undressing: 1: Total-Patient completed less than 25% of tasks  FIM - Toileting Toileting steps completed by patient: Adjust clothing prior to toileting Toileting: 2: Max-Patient completed 1 of 3  steps  FIM - Diplomatic Services operational officer Devices: Sliding board;Bedside commode Toilet Transfers: 5-To toilet/BSC: Supervision (verbal cues/safety issues);5-From toilet/BSC: Supervision (verbal cues/safety issues)  FIM - Press photographer Assistive Devices: HOB elevated;Bed rails;Sliding board;Arm rests Bed/Chair Transfer: 4: Sit > Supine: Min A (steadying pt. > 75%/lift 1 leg);5: Chair or W/C > Bed: Supervision (verbal cues/safety issues);4: Bed > Chair or W/C: Min A (steadying Pt. > 75%)  FIM - Locomotion: Wheelchair Distance: 165 ft Locomotion: Wheelchair: 5: Travels 150 ft or more: maneuvers on rugs and over door sills with supervision, cueing or coaxing FIM - Locomotion: Ambulation Ambulation/Gait Assistance:  (unsafe to perform, pt unable to reach standing) Locomotion: Ambulation:  (attempted 6/26 but unable to take steps)  Comprehension Comprehension Mode: Auditory Comprehension: 5-Understands complex 90% of the time/Cues < 10% of the time  Expression Expression Mode: Verbal Expression: 7-Expresses complex ideas: With no assist  Social Interaction Social Interaction: 6-Interacts appropriately with others with medication or extra time (anti-anxiety, antidepressant).  Problem Solving Problem Solving: 5-Solves basic problems: With no assist  Memory Memory: 6-More than reasonable amt of time  Medical Problem List and Plan:  1. Left distal femur fracture after fall. Status post ORIF 07/10/2012  2. DVT Prophylaxis/Anticoagulation: Subcutaneous Lovenox. Monitor platelet counts and any signs of bleeding.   -doppler test limited but negative 3. Pain Management: Oxycodone as needed. Monitor with increased mobility   -MS contin decreased to 30mg  q12.  -femur xr stable  -pain better with decreased edema. 4. Neuropsych: This patient is capable of making decisions on his/her own behalf.  5. Acute blood loss anemia. Latest hemoglobin 7.5.  Followup CBC and transfuse as appropriate. Continue Aranesp as advised  6. Chronic kidney disease and renal transplant 1996. Baseline creatinine 3.00. Followup per renal services. Continue prednisone/Sirolimus as directed  7. Hypertension/atrial fibrillation. Amiodarone 200 mg daily, coreg 25 mg twice a day, Lasix 80 mg twice a day, hydralazine 25 mg 3 times a day, M.D. or 60 mg daily. Monitor with increased mobility. Patient no chest pain or shortness of breath  8. Nonischemic cardiomyopathy. Continue aspirin therapy.  9. Systolic congestive heart failure. Continue Lasix as advised. Monitor for any signs of fluid overload   -lasix slightly increased to assist with LLE edema  -LLE with coban wrapped with good results 10. Non-insulin-dependent diabetes mellitus. Hemoglobin A1c 5.7. Blood sugars under fair control at present 11. History of gout. Continue allopurinol. Monitor for any gout flareups.  12. Hyperlipidemia. Lipitor  13. Obstructive sleep apnea. CPAP  14. Leukocytosis: no signs of cellulitis  -urine clean  -IS  -likely steroid effect 15.  Nausea- likely med related, morphine reduced with good results. Pain under reasonable control LOS (Days) 11 A FACE TO FACE EVALUATION WAS PERFORMED  Raetta Agostinelli T 07/26/2012 8:08 AM

## 2012-07-26 NOTE — Progress Notes (Signed)
Notes reviewed and accurately reflect treatment sessions.   

## 2012-07-26 NOTE — Progress Notes (Signed)
Physical Therapy Session Note  Patient Details  Name: Chad [name] MRN: [id number] Date of Birth: [birth year]  Today's Date: 07/26/2012 Time: 0828-0858 Time Calculation (min): 30 min  Short Term Goals: Week 1:  PT Short Term Goal 1 (Week 1): Pt will perform sliding board transfers to level surfaces with min assist.  PT Short Term Goal 1 - Progress (Week 1): Met PT Short Term Goal 2 (Week 1): Pt will perform sit <> stand from elevated mat with +2 total assist. PT Short Term Goal 2 - Progress (Week 1): Met PT Short Term Goal 3 (Week 1): Pt will propel wheelchair x 100' with supervision. PT Short Term Goal 3 - Progress (Week 1): Met  Skilled Therapeutic Interventions/Progress Updates:   Pt sitting EOB upon therapist entering room. Assisted pt with donning gown and set up for slide board transfer with steady A to stabilize w/c and place board with S for actual transfer into the w/c. Assist needed for w/c parts management. Pt gathered dirty clothes from w/c in room to take to laundry room. Pt self propelled w/c with S to laundry room and loaded laundry from w/c.   Therapy Documentation Precautions:  Precautions Precautions: Fall Required Braces or Orthoses: Other Brace/Splint Other Brace/Splint: Bledsoe Brace Restrictions Weight Bearing Restrictions: Yes LLE Weight Bearing: Touchdown weight bearing  Pain: Pain Assessment Pain Assessment: 0-10 Pain Score:   7 Pain Location: Knee Premedicated.  See FIM for current functional status  Therapy/Group: Individual Therapy  Karolee Stamps [facility] 07/26/2012, 9:57 AM

## 2012-07-27 ENCOUNTER — Inpatient Hospital Stay (HOSPITAL_COMMUNITY): Payer: Medicaid Other | Admitting: *Deleted

## 2012-07-27 ENCOUNTER — Inpatient Hospital Stay (HOSPITAL_COMMUNITY): Payer: Medicaid Other | Admitting: Physical Therapy

## 2012-07-27 ENCOUNTER — Inpatient Hospital Stay (HOSPITAL_COMMUNITY): Payer: Medicaid Other | Admitting: Occupational Therapy

## 2012-07-27 ENCOUNTER — Inpatient Hospital Stay (HOSPITAL_COMMUNITY): Payer: Medicaid Other

## 2012-07-27 DIAGNOSIS — S72453A Displaced supracondylar fracture without intracondylar extension of lower end of unspecified femur, initial encounter for closed fracture: Secondary | ICD-10-CM

## 2012-07-27 LAB — GLUCOSE, CAPILLARY: Glucose-Capillary: 127 mg/dL — ABNORMAL HIGH (ref 70–99)

## 2012-07-27 MED ORDER — SIMETHICONE 40 MG/0.6ML PO SUSP
80.0000 mg | Freq: Four times a day (QID) | ORAL | Status: DC | PRN
  Administered 2012-07-27: 80 mg via ORAL
  Filled 2012-07-27: qty 1.2

## 2012-07-27 NOTE — Progress Notes (Signed)
Recreational Therapy Session Note  Patient Details  Name: TYLEE NEWBY MRN: [id number] Date of Birth: [birth year] Today's Date: 07/27/2012  Co-treat with PT/Session cancelled due to pts need to toilet.  Madlynn Lundeen 07/27/2012, 4:32 PM

## 2012-07-27 NOTE — Progress Notes (Signed)
Patient ID: Chad [name], male   DOB: 07/16/1955, 57 y.o.   MRN: [id number] Subjective/Complaints: Noted some swelling, pain in left knee after moving with therapy. Swelling as a whole decreased in the left leg. Leg feeling better this am. A 12 point review of systems has been performed and if not noted above is otherwise negative.   Objective: Vital Signs: Blood pressure 138/80, pulse 83, temperature 98.9 F (37.2 C), temperature source Oral, resp. rate 19, weight 128.3 kg (282 lb 13.6 oz), SpO2 92.00%. No results found.  Recent Labs  07/24/12 1205  WBC 13.0*  HGB 8.8*  HCT 29.6*  PLT 233    Recent Labs  07/24/12 1205  NA 134*  K 3.7  CL 93*  GLUCOSE 103*  BUN 95*  CREATININE 3.65*  CALCIUM 9.1   CBG (last 3)   Recent Labs  07/26/12 1646 07/26/12 2131 07/27/12 0653  GLUCAP 127* 113* 99    Wt Readings from Last 3 Encounters:  07/27/12 128.3 kg (282 lb 13.6 oz)  07/13/12 122.471 kg (270 lb)  07/13/12 122.471 kg (270 lb)    Physical Exam:  Constitutional: He is oriented to person, place, and time.  HENT:  Head: Normocephalic.  Eyes: EOM are normal.  Neck: Neck supple. No thyromegaly present.  Cardiovascular:  Cardiac rate controlled  Pulmonary/Chest:  Decreased breath sounds but clear to auscultation  Abdominal: Soft. Bowel sounds are normal. He exhibits no distension.  Neurological: He is alert and oriented to person, place, and time.  Patient follows full commands. Motor function within normal limits in UE's and RLE. Sensory function nl  Skin:  Surgical site is dressed. Left leg remains tender.  Musc: left leg with decreased edema as whole although there is an effusion about the knee. Psychiatric: he anxious but otherwise His behavior is normal. Judgment and thought content normal  Neuro:  Eyes without evidence of nystagmus  Tone is normal without evidence of spasticity  Cerebellar exam shows no evidence of ataxia on finger nose finger or heel to  shin testing  No evidence of trunkal ataxia  Motor strength is 5/5 in bilateral deltoid, biceps, triceps, finger flexors and extensors, wrist flexors and extensors, hip flexors, knee flexors and extensors, ankle dorsiflexors, plantar flexors, invertors and evertors, toe flexors and extensors  Sensory exam is normal to pinprick, proprioception and light touch in the upper and lower limbs    Assessment/Plan: 1. Functional deficits secondary to distal left femur fx which require 3+ hours per day of interdisciplinary therapy in a comprehensive inpatient rehab setting. Physiatrist is providing close team supervision and 24 hour management of active medical problems listed below. Physiatrist and rehab team continue to assess barriers to discharge/monitor patient progress toward functional and medical goals.    FIM: FIM - Bathing Bathing Steps Patient Completed: Chest;Right Arm;Left Arm;Abdomen;Front perineal area;Right upper leg Bathing: 3: Mod-Patient completes 5-7 58f 10 parts or 50-74%  FIM - Upper Body Dressing/Undressing Upper body dressing/undressing steps patient completed: Thread/unthread right sleeve of pullover shirt/dresss;Thread/unthread left sleeve of pullover shirt/dress;Pull shirt over trunk;Put head through opening of pull over shirt/dress Upper body dressing/undressing: 5: Set-up assist to: Obtain clothing/put away FIM - Lower Body Dressing/Undressing Lower body dressing/undressing steps patient completed: Thread/unthread right pants leg Lower body dressing/undressing: 1: Total-Patient completed less than 25% of tasks  FIM - Toileting Toileting steps completed by patient: Adjust clothing prior to toileting Toileting: 0: Activity did not occur  FIM - Diplomatic Services operational officer Devices: Sliding board;Bedside  commode Toilet Transfers: 0-Activity did not occur  FIM - Banker Devices: Arm rests;Sliding board Bed/Chair  Transfer: 5: Supine > Sit: Supervision (verbal cues/safety issues);4: Sit > Supine: Min A (steadying pt. > 75%/lift 1 leg);5: Bed > Chair or W/C: Supervision (verbal cues/safety issues);5: Chair or W/C > Bed: Supervision (verbal cues/safety issues)  FIM - Locomotion: Wheelchair Distance: 250 Locomotion: Wheelchair: 5: Travels 150 ft or more: maneuvers on rugs and over door sills with supervision, cueing or coaxing FIM - Locomotion: Ambulation Ambulation/Gait Assistance: Not tested (comment) Locomotion: Ambulation: 0: Activity did not occur  Comprehension Comprehension Mode: Auditory Comprehension: 5-Understands complex 90% of the time/Cues < 10% of the time  Expression Expression Mode: Verbal Expression: 7-Expresses complex ideas: With no assist  Social Interaction Social Interaction: 6-Interacts appropriately with others with medication or extra time (anti-anxiety, antidepressant).  Problem Solving Problem Solving: 5-Solves basic 90% of the time/requires cueing < 10% of the time  Memory Memory: 5-Recognizes or recalls 90% of the time/requires cueing < 10% of the time  Medical Problem List and Plan:  1. Left distal femur fracture after fall. Status post ORIF 07/10/2012  2. DVT Prophylaxis/Anticoagulation: Subcutaneous Lovenox. Monitor platelet counts and any signs of bleeding.   -doppler test limited but negative 3. Pain Management: Oxycodone as needed. Monitor with increased mobility   -MS contin decreased to 30mg  q12.  -femur xr stable  -pain better with decreased edema  -monitor effusion around the knee 4. Neuropsych: This patient is capable of making decisions on his/her own behalf.  5. Acute blood loss anemia. Latest hemoglobin 7.5. Followup CBC and transfuse as appropriate. Continue Aranesp as advised  6. Chronic kidney disease and renal transplant 1996. Baseline creatinine 3.00. Followup per renal services. Continue prednisone/Sirolimus as directed  7. Hypertension/atrial  fibrillation. Amiodarone 200 mg daily, coreg 25 mg twice a day, Lasix 80 mg twice a day, hydralazine 25 mg 3 times a day, M.D. or 60 mg daily. Monitor with increased mobility. Patient no chest pain or shortness of breath  8. Nonischemic cardiomyopathy. Continue aspirin therapy.  9. Systolic congestive heart failure. Continue Lasix as advised. Monitor for any signs of fluid overload   -lasix slightly increased to assist with LLE edema   -LLE with coban wrapped with good results 10. Non-insulin-dependent diabetes mellitus. Hemoglobin A1c 5.7. Blood sugars under fair control at present 11. History of gout. Continue allopurinol. Monitor for any gout flareups.  12. Hyperlipidemia. Lipitor  13. Obstructive sleep apnea. CPAP  14. Leukocytosis: no signs of cellulitis  -urine clean  -IS  -likely steroid effect 15.  Nausea- improved LOS (Days) 12 A FACE TO FACE EVALUATION WAS PERFORMED  SWARTZ,ZACHARY T 07/27/2012 7:38 AM

## 2012-07-27 NOTE — Plan of Care (Signed)
Problem: RH PAIN MANAGEMENT Goal: RH STG PAIN MANAGED AT OR BELOW PT'S PAIN GOAL Would like pain to be 5 on scale of 1-10  Outcome: Not Progressing Still using robaxin and oxy prn for pain level of 7

## 2012-07-27 NOTE — Progress Notes (Signed)
Patient states that he does not wear CPAP at home, and that he will not be wearing one here tonight.

## 2012-07-27 NOTE — Progress Notes (Signed)
Occupational Therapy Session Note  Patient Details  Name: Chad [name] MRN: [id number] Date of Birth: 11/02/1955  Today's Date: 07/27/2012 Time: 9562-1308 Time Calculation (min): 30 min  Short Term Goals: Week 2:  OT Short Term Goal 1 (Week 2): Pt will perform toileting with max A (1/3 tasks) OT Short Term Goal 2 (Week 2): Pt will don/doff pants while seated on BSC with min A OT Short Term Goal 3 (Week 2): Pt will perform simple meal prep with supervision at w/c level OT Short Term Goal 4 (Week 2): Pt will perform simple home mgmt tasks w/c level with supervision  Skilled Therapeutic Interventions/Progress Updates:    Pt seen for 1:1 OT with focus on slide board transfer and lateral leans to assist in LB dressing.  Pt in bed upon arrival reporting muscle spasms in LLE, but willing to get OOB for treatment session.  Slide board transfer with min/steady assist and placement of slide board prior to transfer with pt directing appropriate placement with question cues.  Engaged in lateral leans on therapy mat with focus on weight shifting to increase participation with perineal hygiene and LB dressing with 2 reps of 5.  Therapy Documentation Precautions:  Precautions Precautions: Fall Required Braces or Orthoses: Other Brace/Splint Other Brace/Splint: Bledsoe Brace Restrictions Weight Bearing Restrictions: Yes LLE Weight Bearing: Touchdown weight bearing Pain: Pain Assessment Pain Assessment: 0-10 Pain Score: 7  Pain Location: Knee Pain Orientation: Left Pain Descriptors / Indicators: Aching Pain Onset: On-going Patients Stated Pain Goal: 4 Pain Intervention(s): Medication (See eMAR)  See FIM for current functional status  Therapy/Group: Individual Therapy  Leonette Monarch 07/27/2012, 3:45 PM

## 2012-07-27 NOTE — Progress Notes (Signed)
Physical Therapy Session Note  Patient Details  Name: Chad [name] MRN: [id number] Date of Birth: 11-12-1955  Today's Date: 07/27/2012 Time: 0900-1000  Time Calculation (min):  Short Term Goals: Week 2:     Skilled Therapeutic Interventions/Progress Updates:    Pt received sitting on bedside commode with wash basin and pt taking sponge bath.  Discussed with pt therapy schedule and notified pt that he is scheduled for OT for bathing and dressing following PT session.  Pt performed various transfers including bedside commode>w/c with sliding board and min assist as pt required assistance raising arm rests on both commode and w/c and assistance readjusting in chair , w/c<>bed sliding board with supervision assistance and min vcs for sequencing and sliding board positioning. Pt performed seated NMR to improve sitting balance at EOB and in w/c. Wheelchair mobility in and around room and on floors 175, and carpeted surfaces 75' with supervision assist.   Therapy Documentation Precautions:  Precautions Precautions: Fall Required Braces or Orthoses: Other Brace/Splint Other Brace/Splint: Bledsoe Brace Restrictions Weight Bearing Restrictions: Yes LLE Weight Bearing: Touchdown weight bearing        Pain: Pain Assessment Pain Assessment: 0-10 Pain Score: 4 Pain Location: Knee Pain Orientation: Left Pain Descriptors / Indicators: Aching Pain Onset: On-going Patients Stated Pain Goal: 4 Pain Intervention(s): Medication (See eMAR)    Locomotion : Wheelchair Mobility Distance:  (265)             See FIM for current functional status  Therapy/Group: Individual Therapy  Jackelyn Knife 07/27/2012, 3:45 PM

## 2012-07-27 NOTE — Progress Notes (Signed)
Occupational Therapy Session Note  Patient Details  Name: Chad [name] MRN: [id number] Date of Birth: 07-17-1955  Today's Date: 07/27/2012 Time: 1000-1100 Time Calculation (min): 60 min  Short Term Goals: Week 2:  OT Short Term Goal 1 (Week 2): Pt will perform toileting with max A (1/3 tasks) OT Short Term Goal 2 (Week 2): Pt will don/doff pants while seated on BSC with min A OT Short Term Goal 3 (Week 2): Pt will perform simple meal prep with supervision at w/c level OT Short Term Goal 4 (Week 2): Pt will perform simple home mgmt tasks w/c level with supervision  Skilled Therapeutic Interventions/Progress Updates:    Pt seated in w/c upon arrival.  Pt stated that he completed bathing and dressing tasks earlier in the morning but needed to wash RLE and don compression hose and sock.  Pt used reacher to remove sock, long handle sponge to bathe RLE, and sock aid to don sock.  Pt required assistance to doff/don compression hose.  Pt rolled in room to gather clean clothing and fold before placing in dresser drawers.  Discussed BADL tasks remaining to be addressed including toilet hygiene and pulling up pants utilizing lateral leans.  Pt agreed that this should be the focus in addition to increasing endurance.  Pt transferred to bed with sliding board required assistance only for board placement.  Pt required min A for supine->sit.  Therapy Documentation Precautions:  Precautions Precautions: Fall Required Braces or Orthoses: Other Brace/Splint Other Brace/Splint: Bledsoe Brace Restrictions Weight Bearing Restrictions: Yes LLE Weight Bearing: Weight bearing as tolerated   Pain: Pain Assessment Pain Assessment: 0-10 Pain Score: 10-Worst pain ever Pain Type: Acute pain Pain Location: Leg Pain Orientation: Left;Mid;Anterior Pain Descriptors / Indicators: Aching Pain Onset: On-going Patients Stated Pain Goal: 3 Pain Intervention(s): RN made aware;Repositioned  See FIM for current  functional status  Therapy/Group: Individual Therapy  [name] 07/27/2012, 11:16 AM

## 2012-07-27 NOTE — Progress Notes (Signed)
Physical Therapy Session Note  Patient Details  Name: Chad [name] MRN: [id number] Date of Birth: 1955/06/20  Today's Date: 07/27/2012 Time: 4098-1191 Time Calculation (min): 24 min  Short Term Goals: Week 1:  PT Short Term Goal 1 (Week 1): Pt will perform sliding board transfers to level surfaces with min assist.  PT Short Term Goal 1 - Progress (Week 1): Met PT Short Term Goal 2 (Week 1): Pt will perform sit <> stand from elevated mat with +2 total assist. PT Short Term Goal 2 - Progress (Week 1): Met PT Short Term Goal 3 (Week 1): Pt will propel wheelchair x 100' with supervision. PT Short Term Goal 3 - Progress (Week 1): Met  Skilled Therapeutic Interventions/Progress Updates:    Patient received from OT sitting edge of mat. Patient with reports of needing to go to the bathroom. Patient transferred mat>wheelchair with slide board and supervision required only for stabilization of wheelchair. Patient propelled x150' in wheelchair with supervision. Transferred wheelchair>bedside commode with slide board and supervision for stabilization of wheelchair. Patient performs seated lateral weight shifts on bedside commode to assist with clothing management. Patient left seated on bedside commode with RN present.  Checked in on patient at 1545 and still sitting on bedside commode with nurse tech present.  Therapy Documentation Precautions:  Precautions Precautions: Fall Required Braces or Orthoses: Other Brace/Splint Other Brace/Splint: Bledsoe Brace Restrictions Weight Bearing Restrictions: Yes LLE Weight Bearing: Touchdown weight bearing General: Amount of Missed PT Time (min): 36 Minutes Missed Time Reason: Unavailable (comment) (on commode) Vital Signs: Oxygen Therapy O2 Device: Nasal cannula O2 Flow Rate (L/min): 2.5 L/min Pain: Pain Assessment Pain Assessment: No/denies pain Pain Score: 0-No pain Pain Location: Knee Pain Orientation: Left Pain Descriptors / Indicators:  Aching Pain Onset: On-going Patients Stated Pain Goal: 4 Pain Intervention(s): Medication (See eMAR) Locomotion : Ambulation Ambulation/Gait Assistance: Not tested (comment) Wheelchair Mobility Distance: 150   See FIM for current functional status  Therapy/Group: Individual Therapy  Chipper Herb. Nyheim Seufert, PT, DPT  07/27/2012, 4:06 PM

## 2012-07-27 NOTE — Plan of Care (Signed)
Problem: RH SKIN INTEGRITY Goal: RH STG ABLE TO PERFORM INCISION/WOUND CARE W/ASSISTANCE STG Able To Perform Incision/Wound Care With Max Assistance.  Outcome: Not Progressing Unna boot lft leg and allevyn

## 2012-07-28 ENCOUNTER — Inpatient Hospital Stay (HOSPITAL_COMMUNITY): Payer: Medicaid Other

## 2012-07-28 ENCOUNTER — Inpatient Hospital Stay (HOSPITAL_COMMUNITY): Payer: Medicaid Other | Admitting: Physical Therapy

## 2012-07-28 DIAGNOSIS — S72453A Displaced supracondylar fracture without intracondylar extension of lower end of unspecified femur, initial encounter for closed fracture: Secondary | ICD-10-CM

## 2012-07-28 LAB — GLUCOSE, CAPILLARY
Glucose-Capillary: 111 mg/dL — ABNORMAL HIGH (ref 70–99)
Glucose-Capillary: 106 mg/dL — ABNORMAL HIGH (ref 70–99)

## 2012-07-28 NOTE — Progress Notes (Signed)
Physical Therapy Note  Patient Details  Name: Chad [name] MRN: [id number] Date of Birth: [birth year] Today's Date: 07/28/2012  1000-1055 (55 minutes) individual Pain: 6/10-8/10 LT LE/ nurse notified [facility] given Focus of treatment: wc mobility training/endurance; car transfer training Other: Oxygen sats (resting) 88-92% on 2 L [place] Treatment: Wc mobility- pt propels wc approximately 120 feet -150 feet x 2 SBA with 2 rest breaks; pt practiced wc >< car transfer (level) with sliding board setup + SBA (pt unable to place left LE in car secondary to lack of space in car) . Pt reports that he has SUV and son has car. Recommended to patient that car would be better for sliding board transfer and that son could bring his car in next week for practice before DC.    1420-1500 (40 minutes) individual Pain: 4/10 LT LE/ premedicated Treatment: Therapeutic exercise bilateral UE focused on activity tolerance monitoring oxygen sats (attempting to decrease oxygen use per MD prior to DC home) Treatment: UE ergonometer (x 3 minutes , X 4 minutes, x 3 minutes with rest break between trials) Level 2 Random program. Oxygen sats varied from 86% to 92% on 2 L [place]; wc mobility 120 feet SBA on unit level surfaces for activity tolerance.   Chuck Caban,JIM 07/28/2012, 11:06 AM

## 2012-07-28 NOTE — Patient Care Conference (Signed)
Inpatient RehabilitationTeam Conference and Plan of Care Update Date: 07/27/2012   Time: 2:15 PM    Patient Name: Chad [name]      Medical Record Number: [id number]  Date of Birth: [birth year] Sex: Male         Room/Bed: 4010/4010-01 Payor Info: Payor: MEDICAID POTENTIAL / Plan: MEDICAID POTENTIAL / Product Type: *No Product type* /    Admitting Diagnosis: L FEMUR FX  Admit Date/Time:  07/15/2012  3:40 PM Admission Comments: No comment available   Primary Diagnosis:  Femur fracture, left Principal Problem: Femur fracture, left  Patient Active Problem List   Diagnosis Date Noted  . Femur fracture, left 07/16/2012  . Fracture, femur, distal 07/10/2012  . Diarrhea 03/16/2012  . Nausea and vomiting 03/13/2012  . Hx of amiodarone therapy 03/13/2012  . Steroid long-term use 03/13/2012  . Pulmonary hypertension 03/13/2012  . CHF (congestive heart failure) 12/08/2011  . Community acquired pneumonia 12/08/2011  . PAF (paroxysmal atrial fibrillation) 12/08/2011  . History of renal transplant 12/08/2011  . HTN (hypertension) 12/08/2011  . Respiratory failure with hypoxia 12/08/2011  . CKD (chronic kidney disease) 11/18/2011  . Hypertension 11/18/2011  . OSA (obstructive sleep apnea) 11/13/2011  . Chronic systolic CHF (congestive heart failure) 10/22/2011  . Acute on chronic systolic heart failure 10/03/2011  . Cellulitis of leg, left 08/27/2011  . Atrial flutter 08/27/2011  . Ulcer of left lower leg 08/27/2011  . Conjunctivitis, acute, left eye 08/27/2011  . 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 Septic shock 06/10/2011  . Acute renal failure 06/10/2011  . Cardiomyopathy 06/10/2011  . Acute systolic heart failure 06/09/2011  . Renal transplant disorder 06/05/2011  . HCAP (healthcare-associated pneumonia) 06/04/2011  . Thrombocytopenia 06/04/2011  . Acute respiratory failure 06/04/2011  . ARDS (adult respiratory distress syndrome) 06/04/2011    Expected Discharge Date:  Expected Discharge Date: 08/05/12  Team Members Present: Physician leading conference: Dr. Faith Rogue Social Worker Present: Amada Jupiter, LCSW Nurse Present: Chana Bode, RN PT Present: Karolee Stamps, PT OT Present: Bretta Bang, OT;Patricia Mat Carne, OT Other (Discipline and Name): Tora Duck, PPS Coordinato     Current Status/Progress Goal Weekly Team Focus  Medical   pain control is improving. swelling better. wounds healing  see prior  see prior, balancing medications   Bowel/Bladder   continent of bowel and bladder, using urinal on BSC that staff empty  continent of bowel and bladder,  continue to monitor and encourage to use toilet when possible rather than BSC   Swallow/Nutrition/ Hydration             ADL's   bathing-mod A; LB dsg-tot A; toilet transfers-steady A/supervision; toileting-tot A  bathing-min A; UB dsg-supervision; LB dse; min A; toilet transfers-min A; toileting-tot A  transfer, toileting, activity tolerance, safety awareness   Mobility   Sliding board transfers supervision/min assist, limited ability to stand from elevated surface  supervision transfers, modified independent wheelchair mobility  Activity tolerance, strengthening, sliding board transfers, standing and transitions to standing   Communication             Safety/Cognition/ Behavioral Observations            Pain   pain fluctuates 5-10 with activity using scheduled MS contin q 12 and prn oxy for breakthrough pain  pt able to tolerate therapy and manage pain with scheduled and prn meds at or below level of 5  monitor effectiveness of medication and assure prn meds given prior to therapy sessions   Skin  unna boot lft LE; changing every 4 days  decreased edema of Lft LE  monitor edema control and assess for continued skin healing and decreased swelling with change of unna boot weekly      *See Care Plan and progress notes for long and short-term goals.  Barriers to Discharge: see prior     Possible Resolutions to Barriers:  see prior, hopefully less care needed than expected    Discharge Planning/Teaching Needs:  home with son to stay and a friend with both able to share in providing 24/7 assistance       Team Discussion:  Slight swelling now in knee but better overall.  Anticipate meeting goals of min assist w/c level.  No concerns  Revisions to Treatment Plan:  None   Continued Need for Acute Rehabilitation Level of Care: The patient requires daily medical management by a physician with specialized training in physical medicine and rehabilitation for the following conditions: Daily direction of a multidisciplinary physical rehabilitation program to ensure safe treatment while eliciting the highest outcome that is of practical value to the patient.: Yes Daily medical management of patient stability for increased activity during participation in an intensive rehabilitation regime.: Yes Daily analysis of laboratory values and/or radiology reports with any subsequent need for medication adjustment of medical intervention for : Other;Neurological problems  [name] 07/28/2012, 9:30 AM

## 2012-07-28 NOTE — Progress Notes (Signed)
Social Work Patient ID: Chad [name], male   DOB: March 01, 1955, 57 y.o.   MRN: [id number]  Have reviewed team conference with pt this morning.  Agreeable with continued plan for d/c 7/10 @ supervision to min assist w/c level.  Confirms that he has three people who will be sharing this caregiving role.  Discussed need for at least one of them to complete family education prior to d/c.  Pt still needs to coordinate who will be providing his d/c transport as well.  Will continue to follow for support and d/c planning.  Orpha Dain, LCSW

## 2012-07-28 NOTE — Progress Notes (Signed)
Recreational Therapy Session Note  Patient Details  Name: Chad [name] MRN: [id number] Date of Birth: 05/18/1955 Today's Date: 07/28/2012 Time:  1000-1120 Pain: 6-8/10 LLE, RN aware, meds given Skilled Therapeutic Interventions/Progress Updates: Session focused on activity tolerance, w/c mobility, UE strengthening, car transfer using sliding board.  Pt propels w/c on unit with supervision, needing therapist to push oxygen tank.  Pt does require rest breaks due to fatigue.   Discussion with pt about car transfers and educated pt on safety concerns with various vehicle seat heights using sliding board.  Pt reports he has a SUV which he agreed wouldn't be workable at this point in time.  Recommended use of a car to transport initially.  Pt stated his son has a car that they could use at discharge.  Pt practiced slide board transfer to and from car seat with set up assist/supervision.  Pt unable to place LLE into car simulator due to space contsraints.  Discussed this as potential barrier when transferring into his son's car as well and identified potential ways to negotiate this obstacle.  Pt plans to have son bring the car to practice & problem solve car transfers prior to discharge.  Therapy/Group: Co-Treatment   Liyla Radliff 07/28/2012, 2:51 PM

## 2012-07-28 NOTE — Progress Notes (Signed)
Patient ID: Chad [name], male   DOB: Aug 12, 1955, 57 y.o.   MRN: [id number] Subjective/Complaints: Feeling better after cleaning bowels out yesterday and this am. Stool formed this morning. Pain controlled A 12 point review of systems has been performed and if not noted above is otherwise negative.   Objective: Vital Signs: Blood pressure 161/90, pulse 87, temperature 98.6 F (37 C), temperature source Oral, resp. rate 20, weight 127.5 kg (281 lb 1.4 oz), SpO2 90.00%. No results found. No results found for this basename: WBC, HGB, HCT, PLT,  in the last 72 hours No results found for this basename: NA, K, CL, CO, GLUCOSE, BUN, CREATININE, CALCIUM,  in the last 72 hours CBG (last 3)   Recent Labs  07/27/12 1641 07/27/12 2149 07/28/12 0725  GLUCAP 127* 111* 103*    Wt Readings from Last 3 Encounters:  07/28/12 127.5 kg (281 lb 1.4 oz)  07/13/12 122.471 kg (270 lb)  07/13/12 122.471 kg (270 lb)    Physical Exam:  Constitutional: He is oriented to person, place, and time.  HENT:  Head: Normocephalic.  Eyes: EOM are normal.  Neck: Neck supple. No thyromegaly present.  Cardiovascular:  Cardiac rate controlled  Pulmonary/Chest:  Decreased breath sounds but clear to auscultation  Abdominal: Soft. Bowel sounds are normal. He exhibits no distension.  Neurological: He is alert and oriented to person, place, and time.  Patient follows full commands. Motor function within normal limits in UE's and RLE. Sensory function nl  Skin:  Surgical site is dressed. Left leg remains tender.  Musc: left leg with decreased edema as whole although there is an effusion about the knee. Psychiatric: he anxious but otherwise His behavior is normal. Judgment and thought content normal  Neuro:  Eyes without evidence of nystagmus  Tone is normal without evidence of spasticity  Cerebellar exam shows no evidence of ataxia on finger nose finger or heel to shin testing  No evidence of trunkal ataxia   Motor strength is 5/5 in bilateral deltoid, biceps, triceps, finger flexors and extensors, wrist flexors and extensors, hip flexors, knee flexors and extensors, ankle dorsiflexors, plantar flexors, invertors and evertors, toe flexors and extensors  Sensory exam is normal to pinprick, proprioception and light touch in the upper and lower limbs    Assessment/Plan: 1. Functional deficits secondary to distal left femur fx which require 3+ hours per day of interdisciplinary therapy in a comprehensive inpatient rehab setting. Physiatrist is providing close team supervision and 24 hour management of active medical problems listed below. Physiatrist and rehab team continue to assess barriers to discharge/monitor patient progress toward functional and medical goals.    FIM: FIM - Bathing Bathing Steps Patient Completed: Chest;Right Arm;Left Arm;Abdomen;Front perineal area;Right upper leg Bathing: 3: Mod-Patient completes 5-7 64f 10 parts or 50-74%  FIM - Upper Body Dressing/Undressing Upper body dressing/undressing steps patient completed: Thread/unthread right sleeve of pullover shirt/dresss;Thread/unthread left sleeve of pullover shirt/dress;Pull shirt over trunk;Put head through opening of pull over shirt/dress Upper body dressing/undressing: 5: Set-up assist to: Obtain clothing/put away FIM - Lower Body Dressing/Undressing Lower body dressing/undressing steps patient completed: Thread/unthread right pants leg Lower body dressing/undressing: 1: Total-Patient completed less than 25% of tasks  FIM - Toileting Toileting steps completed by patient: Adjust clothing prior to toileting;Performs perineal hygiene;Adjust clothing after toileting Toileting Assistive Devices: Grab bar or rail for support Toileting: 5: Set-up assist to: Obtain supplies  FIM - Diplomatic Services operational officer Devices: Human resources officer Transfers: 4-From toilet/BSC: Min A (  steadying Pt. >  75%)  FIM - Banker Devices: Sliding board;Arm rests Bed/Chair Transfer: 5: Bed > Chair or W/C: Supervision (verbal cues/safety issues);5: Chair or W/C > Bed: Supervision (verbal cues/safety issues)  FIM - Locomotion: Wheelchair Distance: 150 Locomotion: Wheelchair: 5: Travels 150 ft or more: maneuvers on rugs and over door sills with supervision, cueing or coaxing FIM - Locomotion: Ambulation Ambulation/Gait Assistance: Not tested (comment) Locomotion: Ambulation: 0: Activity did not occur  Comprehension Comprehension Mode: Auditory Comprehension: 5-Understands basic 90% of the time/requires cueing < 10% of the time  Expression Expression Mode: Verbal Expression: 7-Expresses complex ideas: With no assist  Social Interaction Social Interaction: 6-Interacts appropriately with others with medication or extra time (anti-anxiety, antidepressant).  Problem Solving Problem Solving: 5-Solves basic 90% of the time/requires cueing < 10% of the time  Memory Memory: 5-Recognizes or recalls 90% of the time/requires cueing < 10% of the time  Medical Problem List and Plan:  1. Left distal femur fracture after fall. Status post ORIF 07/10/2012  2. DVT Prophylaxis/Anticoagulation: Subcutaneous Lovenox. Monitor platelet counts and any signs of bleeding.   -doppler test limited but negative 3. Pain Management: Oxycodone as needed. Monitor with increased mobility   -MS contin decreased to 30mg  q12.  -femur xr stable  -pain better with decreased edema  -monitor effusion around the knee--likely will need outpt assessment/MRI 4. Neuropsych: This patient is capable of making decisions on his/her own behalf.  5. Acute blood loss anemia. hgb is trending up 6. Chronic kidney disease and renal transplant 1996. Baseline creatinine 3.00. Followup per renal services. Continue prednisone/Sirolimus as directed  7. Hypertension/atrial fibrillation. Amiodarone 200 mg  daily, coreg 25 mg twice a day, Lasix 80 mg twice a day, hydralazine 25 mg 3 times a day, M.D. or 60 mg daily. Monitor with increased mobility. Patient no chest pain or shortness of breath  8. Nonischemic cardiomyopathy. Continue aspirin therapy.  9. Systolic congestive heart failure. Continue Lasix as advised. Monitor for any signs of fluid overload   -lasix slightly increased to assist with LLE edema   -LLE with coban wrapped with good results 10. Non-insulin-dependent diabetes mellitus. Hemoglobin A1c 5.7. Blood sugars under good control. Change cbg's to qam only 11. History of gout. Continue allopurinol. Monitor for any gout flareups.  12. Hyperlipidemia. Lipitor  13. Obstructive sleep apnea. CPAP  14. Leukocytosis: decreased (13k)  -urine clean  -IS  -likely steroid effect 15.  Nausea- improved LOS (Days) 13 A FACE TO FACE EVALUATION WAS PERFORMED  Jency Schnieders T 07/28/2012 8:08 AM

## 2012-07-28 NOTE — Progress Notes (Signed)
Orthopedic Tech Progress Note Patient Details:  Chad [name] 09-10-1955 [id number]  Ortho Devices Type of Ortho Device: Roland Rack boot Ortho Device/Splint Location: RUE Ortho Device/Splint Interventions: Ordered;Application   [name] 07/28/2012, 5:08 PM

## 2012-07-28 NOTE — Progress Notes (Signed)
Occupational Therapy Session Note  Patient Details  Name: WAYLAND BAIK MRN: [id number] Date of Birth: 06-03-1955  Today's Date: 07/28/2012  Session 1 Time: 0800-0858 Time Calculation (min): 58 min  Short Term Goals: Week 2:  OT Short Term Goal 1 (Week 2): Pt will perform toileting with max A (1/3 tasks) OT Short Term Goal 2 (Week 2): Pt will don/doff pants while seated on BSC with min A OT Short Term Goal 3 (Week 2): Pt will perform simple meal prep with supervision at w/c level OT Short Term Goal 4 (Week 2): Pt will perform simple home mgmt tasks w/c level with supervision  Skilled Therapeutic Interventions/Progress Updates:     Pt seated EOB eating breakfast upon arrival.  Pt engaged in bathing and dressing tasks seated EOB with lateral leans for bathing buttocks and pulling up pants.  Pt was able to partially pull up pants but required assistance to complete task while patient raised buttocks off w/c seat.  Pt used reacher to thread pants legs this morning.  Pt performed sliding board transfer to w/c with supervision to steady w/c during transfer.  Pt continues to require extra time to complete tasks and O2 sats drop to 85% on RA when donning shirt.  Focus on LB bathing and dressing and activity tolerance.    Therapy Documentation Precautions:  Precautions Precautions: Fall Required Braces or Orthoses: Other Brace/Splint Other Brace/Splint: Bledsoe Brace Restrictions Weight Bearing Restrictions: Yes LLE Weight Bearing: Touchdown weight bearing Pain: Pain Assessment Pain Assessment: 0-10 Pain Score: 5  Pain Type: Acute pain Pain Location: Knee Pain Orientation: Left Pain Descriptors / Indicators: Aching Pain Onset: On-going Patients Stated Pain Goal: 2 Pain Intervention(s): RN made aware;Repositioned  See FIM for current functional status  Therapy/Group: Individual Therapy  Session 2 Time: 1330-1400 Pt c/o 5/10 pain in LLE; RN aware and repositioned Individual  Therapy  Pt engaged in w/c mobility tasks in kitchen and ADL apartment.  Discussed strategies for accessing items from w/c level after discharge.  Pt stated his bed at home was approx 8-12" higher than w/c seat.  Pt stated that a friend was getting all the measurements in his home to determine w/c accessibility.    [name] [facility] 07/28/2012, 9:02 AM

## 2012-07-29 ENCOUNTER — Inpatient Hospital Stay (HOSPITAL_COMMUNITY): Payer: Self-pay | Admitting: Physical Therapy

## 2012-07-29 ENCOUNTER — Inpatient Hospital Stay (HOSPITAL_COMMUNITY): Payer: Medicaid Other

## 2012-07-29 ENCOUNTER — Inpatient Hospital Stay (HOSPITAL_COMMUNITY): Payer: Self-pay | Admitting: *Deleted

## 2012-07-29 LAB — CREATININE, SERUM: GFR calc Af Amer: 20 mL/min — ABNORMAL LOW (ref >90)

## 2012-07-29 LAB — GLUCOSE, CAPILLARY: Glucose-Capillary: 92 mg/dL (ref 70–99)

## 2012-07-29 NOTE — Progress Notes (Signed)
Pt refuses to wear CPAP.  °

## 2012-07-29 NOTE — Progress Notes (Addendum)
Physical Therapy Session Note  Patient Details  Name: Chad [name] MRN: [id number] Date of Birth: 06-22-1955  Today's Date: 07/29/2012 Time: 0830-0930 Time Calculation (min): 60 min   Skilled Therapeutic Interventions/Progress Updates:    Session focused on sliding board transfer to home bed height and bed mobility. Pt min-guard for sliding board transfer to elevated height, superivision from elevated height to wheelchair. Pt occasionally needs wheelchair set up but is able to place and remove board with min verbal cues. Practiced sit >< supine x 3 with leg lifter and sheet, to Rt. And Lt. Pt needs min assist when going to Lt, able to do with supervision and increased time to Rt. Pt perfers sheet over leg lifter as a means to get Lt. LE into bed.   SpO2 down to 82 on 1L does not return to >90% with deep breaths, returns to >90% with 2L. During activity pt drops to 86% SpO2 with 2L but returns to WNL with rest and cues for deep breathing.   Therapy Documentation Precautions:  Precautions Precautions: Fall Required Braces or Orthoses: Other Brace/Splint Other Brace/Splint: Bledsoe Brace Restrictions Weight Bearing Restrictions: Yes LLE Weight Bearing: Touchdown weight bearing Pain: Pain Assessment Pain Score: 5  Pain Type: Acute pain Pain Location: Knee Pain Orientation: Left Pain Descriptors / Indicators: Aching Pain Onset: On-going Pain Intervention(s): Repositioned (premedicated)  See FIM for current functional status  Therapy/Group: Individual Therapy  [name] 07/29/2012, 12:14 PM

## 2012-07-29 NOTE — Progress Notes (Addendum)
Patient ID: Chad [name], male   DOB: 1955-11-20, 57 y.o.   MRN: [id number] Subjective/Complaints: Oxygen sats down to 76 this am. Upon my entering 80%. No distress. No cough, no sob. I had him perform 10 deep breaths and sit up straight and sats returned to 90's.  A 12 point review of systems has been performed and if not noted above is otherwise negative.   Objective: Vital Signs: Blood pressure 129/77, pulse 77, temperature 98.3 F (36.8 C), temperature source Oral, resp. rate 17, weight 127.2 kg (280 lb 6.8 oz), SpO2 86.00%. No results found. No results found for this basename: WBC, HGB, HCT, PLT,  in the last 72 hours  Recent Labs  07/29/12 0533  CREATININE 3.70*   CBG (last 3)   Recent Labs  07/28/12 0725 07/28/12 1107 07/29/12 0746  GLUCAP 103* 106* 92    Wt Readings from Last 3 Encounters:  07/29/12 127.2 kg (280 lb 6.8 oz)  07/13/12 122.471 kg (270 lb)  07/13/12 122.471 kg (270 lb)    Physical Exam:  Constitutional: He is oriented to person, place, and time.  HENT:  Head: Normocephalic.  Eyes: EOM are normal.  Neck: Neck supple. No thyromegaly present.  Cardiovascular:  Cardiac rate controlled  Pulmonary/Chest:  Decreased breath sounds at bases but clear to auscultation otherwise.  Abdominal: Soft. Bowel sounds are normal. He exhibits no distension.  Neurological: He is alert and oriented to person, place, and time.  Patient follows full commands. Motor function within normal limits in UE's and RLE. Sensory function nl  Skin:  Surgical site is dressed. Left leg remains tender.  Musc: left leg with decreased edema as whole although there is an effusion about the knee. Poor posture with kyphosis and he forward/shoulders IR position Psychiatric: he anxious but otherwise His behavior is normal. Judgment and thought content normal  Neuro:  Eyes without evidence of nystagmus  Tone is normal without evidence of spasticity  Cerebellar exam shows no evidence of  ataxia on finger nose finger or heel to shin testing  No evidence of trunkal ataxia  Motor strength is 5/5 in bilateral deltoid, biceps, triceps, finger flexors and extensors, wrist flexors and extensors, hip flexors, knee flexors and extensors, ankle dorsiflexors, plantar flexors, invertors and evertors, toe flexors and extensors  Sensory exam is normal to pinprick, proprioception and light touch in the upper and lower limbs    Assessment/Plan: 1. Functional deficits secondary to distal left femur fx which require 3+ hours per day of interdisciplinary therapy in a comprehensive inpatient rehab setting. Physiatrist is providing close team supervision and 24 hour management of active medical problems listed below. Physiatrist and rehab team continue to assess barriers to discharge/monitor patient progress toward functional and medical goals.    FIM: FIM - Bathing Bathing Steps Patient Completed: Chest;Right Arm;Left Arm;Abdomen;Front perineal area;Right upper leg;Right lower leg (including foot) Bathing: 3: Mod-Patient completes 5-7 59f 10 parts or 50-74%  FIM - Upper Body Dressing/Undressing Upper body dressing/undressing steps patient completed: Thread/unthread right sleeve of pullover shirt/dresss;Thread/unthread left sleeve of pullover shirt/dress;Pull shirt over trunk;Put head through opening of pull over shirt/dress Upper body dressing/undressing: 5: Set-up assist to: Obtain clothing/put away FIM - Lower Body Dressing/Undressing Lower body dressing/undressing steps patient completed: Thread/unthread right pants leg;Thread/unthread left pants leg;Pull pants up/down;Don/Doff right sock Lower body dressing/undressing: 3: Mod-Patient completed 50-74% of tasks  FIM - Toileting Toileting steps completed by patient: Adjust clothing prior to toileting;Performs perineal hygiene;Adjust clothing after toileting Toileting Assistive Devices: Grab  bar or rail for support Toileting: 5: Set-up assist  to: Obtain supplies  FIM - Diplomatic Services operational officer Devices: Human resources officer Transfers: 4-From toilet/BSC: Min A (steadying Pt. > 75%)  FIM - Bed/Chair Transfer Bed/Chair Transfer Assistive Devices: Bed rails;Sliding board Bed/Chair Transfer: 5: Supine > Sit: Supervision (verbal cues/safety issues);5: Bed > Chair or W/C: Supervision (verbal cues/safety issues)  FIM - Locomotion: Wheelchair Distance: 150 Locomotion: Wheelchair: 5: Travels 150 ft or more: maneuvers on rugs and over door sills with supervision, cueing or coaxing FIM - Locomotion: Ambulation Ambulation/Gait Assistance: Not tested (comment) Locomotion: Ambulation: 0: Activity did not occur  Comprehension Comprehension Mode: Auditory Comprehension: 5-Understands basic 90% of the time/requires cueing < 10% of the time  Expression Expression Mode: Verbal Expression: 7-Expresses complex ideas: With no assist  Social Interaction Social Interaction: 6-Interacts appropriately with others with medication or extra time (anti-anxiety, antidepressant).  Problem Solving Problem Solving: 5-Solves basic 90% of the time/requires cueing < 10% of the time  Memory Memory: 5-Recognizes or recalls 90% of the time/requires cueing < 10% of the time  Medical Problem List and Plan:  1. Left distal femur fracture after fall. Status post ORIF 07/10/2012  2. DVT Prophylaxis/Anticoagulation: Subcutaneous Lovenox. Monitor platelet counts and any signs of bleeding.   -doppler test limited but negative 3. Pain Management: Oxycodone as needed. Monitor with increased mobility   -MS contin decreased to 30mg  q12.  -femur xr stable  -pain better with decreased edema  -monitor effusion around the knee--likely will need outpt assessment/MRI 4. Neuropsych: This patient is capable of making decisions on his/her own behalf.  5. Acute blood loss anemia. hgb is trending up 6. Chronic kidney disease and renal  transplant 1996. Baseline creatinine 3.00. Followup per renal services. Continue prednisone/Sirolimus as directed  7. Hypertension/atrial fibrillation. Amiodarone 200 mg daily, coreg 25 mg twice a day, Lasix 80 mg twice a day, hydralazine 25 mg 3 times a day, M.D. or 60 mg daily. Monitor with increased mobility. Patient no chest pain or shortness of breath  8. Nonischemic cardiomyopathy. Continue aspirin therapy.  9. Systolic congestive heart failure. Continue Lasix as advised.  `  -lasix slightly increased to assist with LLE edema   -LLE with coban wrapped with good results  -still hypoxic at times but has poor breathing techniques--sats returned to normal with simple cueing and instruction  -recheck CXR today.  -encouraged better posture, IS   -wean oxygen as able. Likely will need to go home on home oxygen at this point 10. Non-insulin-dependent diabetes mellitus. Hemoglobin A1c 5.7. Blood sugars under good control. Change cbg's to qam only 11. History of gout. Continue allopurinol. Monitor for any gout flareups.  12. Hyperlipidemia. Lipitor  13. Obstructive sleep apnea. CPAP  14. Leukocytosis: decreased (13k)  -urine clean  -IS  -likely steroid effect 15.  Nausea- improved LOS (Days) 14 A FACE TO FACE EVALUATION WAS PERFORMED  Lennix Kneisel T 07/29/2012 9:10 AM

## 2012-07-29 NOTE — Progress Notes (Signed)
Patient refuses to wear CPAP.

## 2012-07-29 NOTE — Progress Notes (Signed)
Occupational Therapy Session Note  Patient Details  Name: Chad [name] MRN: [id number] Date of Birth: [birth year]  Today's Date: 07/29/2012  Session 1 Time: 0700-0755 Time Calculation (min): 55 min  Short Term Goals: Week 2:  OT Short Term Goal 1 (Week 2): Pt will perform toileting with max A (1/3 tasks) OT Short Term Goal 2 (Week 2): Pt will don/doff pants while seated on BSC with min A OT Short Term Goal 3 (Week 2): Pt will perform simple meal prep with supervision at w/c level OT Short Term Goal 4 (Week 2): Pt will perform simple home mgmt tasks w/c level with supervision  Skilled Therapeutic Interventions/Progress Updates:    Pt resting in bed upon arrival.  Pt engaged in bathing and dressing tasks seated EOB utilizing AE appropriately and incorporating lateral leans to pull up pants over buttocks.  Pt was able to donn pants this morning without assistance but requiring multiple lateral leans.  Pt performed sliding board transfer bed->w/c transfer at supervision level.  Pt requires extra time to complete all tasks.  Pt completed UB bathing and dressing on RA and O2 sats dropped to 76%.  Pt O2 sats recovered to 92% on 2L O2 in approx 60 secs.  Pt O2 sats continue to drop <90% on 2L O2 when resting.  Pt sits with kyphotic posture.  Recommended to patient to sit more upright and use incentive spirometer hourly.  Therapy Documentation Precautions:  Precautions Precautions: Fall Required Braces or Orthoses: Other Brace/Splint Other Brace/Splint: Bledsoe Brace Restrictions Weight Bearing Restrictions: Yes LLE Weight Bearing: Touchdown weight bearing Pain: Pain Assessment Pain Assessment: No/denies pain  See FIM for current functional status  Therapy/Group: Individual Therapy  Session 2 Time: 1000-1056 Pt c/o 7/10 pain in LLE; RN aware and Meds administered prior to therapy; repositioned Individual Therapy  Pt engaged in bed mobility (supine<>sit) and sliding board transfers  prior to rolling to gym for BUE therex on UE ergometer.  Focus during therex on breathing techniques to maintain O2 >90% on 2L O2 and on RA.  Pt O2 sats>90% on 2L O2 after 5 mins activity but dropped to 85% when resting.  When patient corrects sitting posture and focuses on correct breathing techniques O2 increases above 90% within 15 seconds.  Pt attempted 1 min UE therex on UE ergometer on RA and O2 sats dropped to 80%.  Pt rolled back to room and remained in w/c with all needs within reach.   Session 3 Time: 1330-1400 Pt c/o 5/10 pain LLE; RN aware and repositioned Individual Therapy  Focus on w/c<>drop arm BSC transfers and toileting.  Pt performs transfers with sliding board at supervision with assist only to steady BSC and board.  Pt is able to doff pants and pull pants up on left leg but required assistance pulling pants up completely before transfer to w/c.  Discussed BSC positioning and placement when discharged and patient verbalized understanding.  [name] [facility] 07/29/2012, 7:55 AM

## 2012-07-30 ENCOUNTER — Inpatient Hospital Stay (HOSPITAL_COMMUNITY): Payer: Medicaid Other

## 2012-07-30 ENCOUNTER — Inpatient Hospital Stay (HOSPITAL_COMMUNITY): Payer: Medicaid Other | Admitting: Physical Therapy

## 2012-07-30 DIAGNOSIS — S72453A Displaced supracondylar fracture without intracondylar extension of lower end of unspecified femur, initial encounter for closed fracture: Secondary | ICD-10-CM

## 2012-07-30 DIAGNOSIS — R5381 Other malaise: Secondary | ICD-10-CM

## 2012-07-30 LAB — GLUCOSE, CAPILLARY: Glucose-Capillary: 94 mg/dL (ref 70–99)

## 2012-07-30 NOTE — Progress Notes (Signed)
Physical Therapy Weekly Progress Note  Patient Details  Name: Chad [name] MRN: [id number] Date of Birth: Dec 04, 1955  Today's Date: 07/30/2012 Time: 0830-0930 Time Calculation (min): 60 min  Patient has met 3 of 3 short term goals.  Pt is making great progress and is able to now place and remove board with only occasional verbal cues from therapist. He is able to perform sliding board transfers to elevated surfaces with supervision. Pt is working on continued strengthening of Rt. LE, bed mobility (lifting Lt. LE into bed), directing wheelchair set up for transfers, and overall endurance to promote safe return home. Therapist is waiting for measurements from house for further problem solving of wheelchair access in home.   Patient continues to demonstrate the following deficits: decreased endurance, functional strength (very weak Rt. LE), still decreased safety with transfers at times, inability to perform bed mobility by self, increased need for assist with functional activities and therefore will continue to benefit from skilled PT intervention to enhance overall performance with activity tolerance, balance, ability to compensate for deficits and knowledge of precautions.  Patient progressing toward long term goals..  Continue plan of care.  PT Short Term Goals Week 2:  PT Short Term Goal 1 (Week 2): Pt will place and remove board for sliding board transfer with supervision PT Short Term Goal 1 - Progress (Week 2): Met PT Short Term Goal 2 (Week 2): Pt will verbalize and adhere to weight bearing precautions with min verbal cues throughout 60 min treatment session. PT Short Term Goal 2 - Progress (Week 2): Met PT Short Term Goal 3 (Week 2): Pt will propel wheelchair x 150' continuou with  supervision PT Short Term Goal 3 - Progress (Week 2): Met  Skilled Therapeutic Interventions/Progress Updates:    Sliding board transfer with overall supervision/set-up practicing home set up with picture  of actual bed to assist with problem solving. Pt has difficulty spatially relating to home bed. Pt practiced setting up wheelchair and board for chair > bed transfer (supervision) as well as directing wheelchair set up for bed > wheelchair to rehab tech who has not worked with pt, min cues for directing set up. Pt able to lift Lt. LE into bed (going down to Rt. Elbow) with supervision and use of sheet however this remains difficult for pt. Sit <> stands from elevated mat min assist with horseshoe toss in standing for balance, intermittent cues for weight bearing precautions in standing. Pt does well once standing but has difficulty with sit <> stand transitions particularly from surfaces that are not elevated.   Therapy Documentation Precautions:  Precautions Precautions: Fall Required Braces or Orthoses: Other Brace/Splint Other Brace/Splint: Bledsoe Brace Restrictions Weight Bearing Restrictions: Yes LLE Weight Bearing: Touchdown weight bearing Pain: Pain Assessment Pain Score: 4  Pain Type: Acute pain Pain Location: Knee Pain Orientation: Left Pain Descriptors / Indicators: Aching Pain Onset: With Activity Pain Intervention(s): Medication (See eMAR)  See FIM for current functional status  Therapy/Group: Individual Therapy  [name] 07/30/2012, 12:19 PM

## 2012-07-30 NOTE — Progress Notes (Signed)
Occupational Therapy Weekly Progress Note  Patient Details  Name: Chad [name] MRN: [id number] Date of Birth: August 03, 1955  Today's Date: 07/30/2012  Patient has met 2 of 4 short term goals.  Pt continues to make steady progress with bathing, dressing, BSC transfers, and toileting.  Pt completes bed<>w/c and w/c<>drop arm BSC transfers with supervision/assistance steadying sliding board.  Pt requires assistance pulling up pants and toilet hygiene but continues to progress with these tasks Patient continues to demonstrate the following deficits: decreased activity tolerance, decreased BUE strength/endurance, decreased independence with fxal transfers, decreased independence with BADL's/IADL's, pain limiting participation and therefore will continue to benefit from skilled OT intervention to enhance overall performance with BADL, iADL and Reduce care partner burden.  Patient not progressing toward long term goals.  See goal revision.  Plan of care revisions: Simple meal prep and light housekeeping goals downgraded to Supervision. Toilet transfer goal upgraded to supervision.  OT Short Term Goals Week 2:  OT Short Term Goal 1 (Week 2): Pt will perform toileting with max A (1/3 tasks) OT Short Term Goal 1 - Progress (Week 2): Met OT Short Term Goal 2 (Week 2): Pt will don/doff pants while seated on BSC with min A OT Short Term Goal 2 - Progress (Week 2): Met OT Short Term Goal 3 (Week 2): Pt will perform simple meal prep with supervision at w/c level OT Short Term Goal 3 - Progress (Week 2): Progressing toward goal OT Short Term Goal 4 (Week 2): Pt will perform simple home mgmt tasks w/c level with supervision OT Short Term Goal 4 - Progress (Week 2): Progressing toward goal Week 3:  OT Short Term Goal 1 (Week 3): Pt will perform simple meal prep at w/c level with supervision OT Short Term Goal 2 (Week 3): Pt will perform simple home mgmt tasks at w/c level with supervison OT Short Term Goal 3  (Week 3): Pt will perform LB dressing with min A utilizing lateral leans to pull up pants OT Short Term Goal 4 (Week 3): Pt will perform toileting with mod A  Therapy Documentation Precautions:  Precautions Precautions: Fall Required Braces or Orthoses: Other Brace/Splint Other Brace/Splint: Bledsoe Brace Restrictions Weight Bearing Restrictions: Yes LLE Weight Bearing: Touchdown weight bearing  See FIM for current functional status  Therapy/Group: Individual Therapy  [name] 07/30/2012, 7:58 AM

## 2012-07-30 NOTE — Progress Notes (Signed)
Weekly note reviewed and accurately reflects weekly progress.   

## 2012-07-30 NOTE — Progress Notes (Signed)
Occupational Therapy Session Note  Patient Details  Name: Chad [name] MRN: [id number] Date of Birth: Feb 12, 1955  Today's Date: 07/30/2012  Session 1 Time: 0700-0755 Time Calculation (min): 55 min  Short Term Goals: Week 3:  OT Short Term Goal 1 (Week 3): Pt will perform simple meal prep at w/c level with supervision OT Short Term Goal 2 (Week 3): Pt will perform simple home mgmt tasks at w/c level with supervison OT Short Term Goal 3 (Week 3): Pt will perform LB dressing with min A utilizing lateral leans to pull up pants OT Short Term Goal 4 (Week 3): Pt will perform toileting with mod A  Skilled Therapeutic Interventions/Progress Updates:    Pt completed bathing and dressing seated EOB with focus on lateral leans for bathing buttocks and pulling up pants.  Pt O2 sats>90% on 2L O2 but decreased to 82% on RA.  Pt transferred to w/c to practice w/c mobility without O2.  O2 sats at 82% on RA but recovered to >90% on 2L O2 within 60 seconds and pursed lip breathing.  Therapy Documentation Precautions:  Precautions Precautions: Fall Required Braces or Orthoses: Other Brace/Splint Other Brace/Splint: Bledsoe Brace Restrictions Weight Bearing Restrictions: Yes LLE Weight Bearing: Touchdown weight bearing   Pain: Pain Assessment Pain Score: 5 Pain Type: Acute pain Pain Location: Knee Pain Orientation: Left Pain Descriptors / Indicators: Pressure Patients Stated Pain Goal: 3 Pain Intervention(s): RN aware; repositioned   See FIM for current functional status  Therapy/Group: Individual Therapy  Session 2 Time: 1100-1130 Pt c/o 6/10 pain when sitting in w/c; 2/10 after repositioned in bed Individual Therapy  Focus on w/c<>bed transfers with sliding board and bed mobility.  Pt required steady A when transferring to elevated surface and supervision when transferring to level surface.  Pt rolls side to side using bed rails with min A.  [name] [facility] 07/30/2012, 8:01  AM

## 2012-07-30 NOTE — Progress Notes (Signed)
Physical Therapy Session Note  Patient Details  Name: Chad [name] MRN: [id number] Date of Birth: 03/26/1955  Today's Date: 07/30/2012 Time: 4401-0272 Time Calculation (min): 45 min   Skilled Therapeutic Interventions/Progress Updates:    Friend of pt present for session, she will not be a primary caregiver during session however discussed importance of retrieving doorway measurements, she reports she will bring them in over the weekend. Discussed entry and maneuvering in the house with pt and friend, problem solved through barriers. Pt supine in bed, donned pants with min assist and cues for sequence.   Pt practiced directing friend for wheelchair set-up, only one cue from therapist for one of wheelchair brakes that popped loose after having been locked. Pt able to place board and perform transfer with supervision. Wheelchair propulsion x 175', 170' continuous at slow speed with supervision. Leg rest adjusted for increased length per pt request, pt encouraged to also scoot Lt. Hip back into chair if he feels more pressure on foot (tends to slide forwards in chair). .   Therapy Documentation Precautions:  Precautions Precautions: Fall Required Braces or Orthoses: Other Brace/Splint Other Brace/Splint: Bledsoe Brace Restrictions Weight Bearing Restrictions: Yes LLE Weight Bearing: Touchdown weight bearing Pain: Pain Assessment Faces Pain Scale: Hurts a little bit Pain Type: Acute pain Pain Location: Leg Pain Orientation: Left Pain Descriptors / Indicators: Spasm Pain Onset:  (post activity) Pain Intervention(s): repositioned  See FIM for current functional status  Therapy/Group: Individual Therapy  [name] 07/30/2012, 3:34 PM

## 2012-07-30 NOTE — Progress Notes (Signed)
Patient ID: Chad [name], male   DOB: 1955/03/20, 57 y.o.   MRN: [id number] Subjective/Complaints: Back is itching, no other areas No distress. No cough, no sob. A 12 point review of systems has been performed and if not noted above is otherwise negative.   Objective: Vital Signs: Blood pressure 118/72, pulse 73, temperature 98.2 F (36.8 C), temperature source Oral, resp. rate 19, weight 127.9 kg (281 lb 15.5 oz), SpO2 94.00%. Dg Chest 2 View  07/29/2012   *RADIOLOGY REPORT*  Clinical Data: Shortness of breath, postop left leg surgery  CHEST - 2 VIEW  Comparison: Portable chest x-ray of 07/11/2012  Findings: The lungs are not optimally aerated with some elevation of the right hemidiaphragm and mild right basilar atelectasis. Moderate cardiomegaly is stable.  Resolution is limited due to body habitus.  IMPRESSION: Suboptimal aeration with elevation of the right hemidiaphragm. Stable moderate cardiomegaly.   Original Report Authenticated By: Dwyane Dee, M.D.   No results found for this basename: WBC, HGB, HCT, PLT,  in the last 72 hours  Recent Labs  07/29/12 0533  CREATININE 3.70*   CBG (last 3)   Recent Labs  07/28/12 1107 07/29/12 0746 07/30/12 0742  GLUCAP 106* 92 94    Wt Readings from Last 3 Encounters:  07/30/12 127.9 kg (281 lb 15.5 oz)  07/13/12 122.471 kg (270 lb)  07/13/12 122.471 kg (270 lb)    Physical Exam:  Constitutional: He is oriented to person, place, and time.  HENT:  Head: Normocephalic.  Eyes: EOM are normal.  Neck: Neck supple. No thyromegaly present.  Cardiovascular:  Cardiac rate controlled  Pulmonary/Chest:  Decreased breath sounds at bases but clear to auscultation otherwise.  Abdominal: Soft. Bowel sounds are normal. He exhibits no distension.  Neurological: He is alert and oriented to person, place, and time.  Patient follows full commands. Motor function within normal limits in UE's and RLE. Sensory function nl  Skin:  Surgical site is  dressed. Left leg remains tender.  Musc: left leg with decreased edema as whole although there is an effusion about the knee. Poor posture with kyphosis and he forward/shoulders IR position Psychiatric: he anxious but otherwise His behavior is normal. Judgment and thought content normal  Neuro:  Eyes without evidence of nystagmus  Tone is normal without evidence of spasticity  Cerebellar exam shows no evidence of ataxia on finger nose finger or heel to shin testing  No evidence of trunkal ataxia  Skin- no evidence of rash     Assessment/Plan: 1. Functional deficits secondary to distal left femur fx which require 3+ hours per day of interdisciplinary therapy in a comprehensive inpatient rehab setting. Physiatrist is providing close team supervision and 24 hour management of active medical problems listed below. Physiatrist and rehab team continue to assess barriers to discharge/monitor patient progress toward functional and medical goals.    FIM: FIM - Bathing Bathing Steps Patient Completed: Chest;Right Arm;Left Arm;Abdomen;Front perineal area;Right upper leg;Right lower leg (including foot) Bathing: 3: Mod-Patient completes 5-7 67f 10 parts or 50-74%  FIM - Upper Body Dressing/Undressing Upper body dressing/undressing steps patient completed: Thread/unthread right sleeve of pullover shirt/dresss;Thread/unthread left sleeve of pullover shirt/dress;Pull shirt over trunk;Put head through opening of pull over shirt/dress Upper body dressing/undressing: 5: Set-up assist to: Obtain clothing/put away FIM - Lower Body Dressing/Undressing Lower body dressing/undressing steps patient completed: Thread/unthread right pants leg;Thread/unthread left pants leg;Pull pants up/down;Don/Doff right sock Lower body dressing/undressing: 3: Mod-Patient completed 50-74% of tasks  FIM - Toileting Toileting  steps completed by patient: Adjust clothing prior to toileting Toileting Assistive Devices: Grab bar  or rail for support Toileting: 2: Max-Patient completed 1 of 3 steps  FIM - Diplomatic Services operational officer Devices: Human resources officer Transfers: 5-To toilet/BSC: Supervision (verbal cues/safety issues);5-From toilet/BSC: Supervision (verbal cues/safety issues)  FIM - Press photographer Assistive Devices: Bed rails;Sliding board Bed/Chair Transfer: 5: Supine > Sit: Supervision (verbal cues/safety issues);5: Bed > Chair or W/C: Supervision (verbal cues/safety issues)  FIM - Locomotion: Wheelchair Distance: 150 Locomotion: Wheelchair: 5: Travels 150 ft or more: maneuvers on rugs and over door sills with supervision, cueing or coaxing FIM - Locomotion: Ambulation Ambulation/Gait Assistance: Not tested (comment) Locomotion: Ambulation: 0: Activity did not occur  Comprehension Comprehension Mode: Auditory Comprehension: 5-Understands complex 90% of the time/Cues < 10% of the time  Expression Expression Mode: Verbal Expression: 6-Expresses complex ideas: With extra time/assistive device  Social Interaction Social Interaction: 6-Interacts appropriately with others with medication or extra time (anti-anxiety, antidepressant).  Problem Solving Problem Solving: 5-Solves basic 90% of the time/requires cueing < 10% of the time  Memory Memory: 5-Recognizes or recalls 90% of the time/requires cueing < 10% of the time  Medical Problem List and Plan:  1. Left distal femur fracture after fall. Status post ORIF 07/10/2012  2. DVT Prophylaxis/Anticoagulation: Subcutaneous Lovenox. Monitor platelet counts and any signs of bleeding.   -doppler test limited but negative 3. Pain Management: Oxycodone as needed. Monitor with increased mobility   -MS contin  30mg  q12. May consider further wean, could be causing some pruritis  -femur xr stable  -pain better with decreased edema  -monitor effusion around the knee--likely will need outpt  assessment/MRI 4. Neuropsych: This patient is capable of making decisions on his/her own behalf.  5. Acute blood loss anemia. hgb is trending up 6. Chronic kidney disease and renal transplant 1996. Baseline creatinine 3.00. Followup per renal services. Continue prednisone/Sirolimus as directed  7. Hypertension/atrial fibrillation. Amiodarone 200 mg daily, coreg 25 mg twice a day, Lasix 80 mg twice a day, hydralazine 25 mg 3 times a day, M.D. or 60 mg daily. Monitor with increased mobility. Patient no chest pain or shortness of breath  8. Nonischemic cardiomyopathy. Continue aspirin therapy.  9. Systolic congestive heart failure. Continue Lasix as advised.  `  -lasix slightly increased to assist with LLE edema   -LLE with coban wrapped with good results  -still hypoxic at times but has poor breathing techniques--sats returned to normal with simple cueing and instruction  -recheck CXR today.  -encouraged better posture, IS   -wean oxygen as able. Likely will need to go home on home oxygen at this point 10. Non-insulin-dependent diabetes mellitus. Hemoglobin A1c 5.7. Blood sugars under good control. Change cbg's to qam only 11. History of gout. Continue allopurinol. Monitor for any gout flareups.  12. Hyperlipidemia. Lipitor  13. Obstructive sleep apnea. CPAP  14. Leukocytosis: decreased (13k)  -urine clean  -IS  -likely steroid effect 15.  Nausea- improved LOS (Days) 15 A FACE TO FACE EVALUATION WAS PERFORMED  KIRSTEINS,ANDREW E 07/30/2012 10:07 AM

## 2012-07-31 ENCOUNTER — Inpatient Hospital Stay (HOSPITAL_COMMUNITY): Payer: Medicaid Other | Admitting: Occupational Therapy

## 2012-07-31 LAB — GLUCOSE, CAPILLARY: Glucose-Capillary: 105 mg/dL — ABNORMAL HIGH (ref 70–99)

## 2012-07-31 NOTE — Progress Notes (Signed)
Patient ID: Chad [name], male   DOB: 25-Jan-1956, 57 y.o.   MRN: [id number] Patient ID: Chad [name], male   DOB: Sep 17, 1955, 57 y.o.   MRN: [id number] Subjective/Complaints:  7/5.  57 y/o admit s/p L femur fx.  H/o PAF, HCAP with ARF/ ARDS; h/o OSA, HTN and CKD. Feels well; no compalints.   Intake/Output Summary (Last 24 hours) at 07/31/12 0906 Last data filed at 07/31/12 0600  Gross per 24 hour  Intake    840 ml  Output   1150 ml  Net   -310 ml   Patient Vitals for the past 24 hrs:  BP Temp Temp src Pulse Resp SpO2 Weight  07/31/12 0500 129/79 mmHg 98.4 F (36.9 C) Oral 72 18 95 % 129.8 kg (286 lb 2.5 oz)  07/30/12 2110 130/78 mmHg - - - - - -  07/30/12 1525 128/75 mmHg 98.2 F (36.8 C) Oral 73 16 95 % -    Objective: Vital Signs: Blood pressure 129/79, pulse 72, temperature 98.4 F (36.9 C), temperature source Oral, resp. rate 18, weight 129.8 kg (286 lb 2.5 oz), SpO2 95.00%. Dg Chest 2 View  07/29/2012   *RADIOLOGY REPORT*  Clinical Data: Shortness of breath, postop left leg surgery  CHEST - 2 VIEW  Comparison: Portable chest x-ray of 07/11/2012  Findings: The lungs are not optimally aerated with some elevation of the right hemidiaphragm and mild right basilar atelectasis. Moderate cardiomegaly is stable.  Resolution is limited due to body habitus.  IMPRESSION: Suboptimal aeration with elevation of the right hemidiaphragm. Stable moderate cardiomegaly.   Original Report Authenticated By: Dwyane Dee, M.D.   No results found for this basename: WBC, HGB, HCT, PLT,  in the last 72 hours  Recent Labs  07/29/12 0533  CREATININE 3.70*   CBG (last 3)   Recent Labs  07/29/12 0746 07/30/12 0742 07/31/12 0731  GLUCAP 92 94 105*    Wt Readings from Last 3 Encounters:  07/31/12 129.8 kg (286 lb 2.5 oz)  07/13/12 122.471 kg (270 lb)  07/13/12 122.471 kg (270 lb)    Physical Exam:  Constitutional: He is oriented to person, place, and time.  HENT:   N/c O2 Head:  Normocephalic.  Eyes: EOM are normal.  Neck: Neck supple. No thyromegaly present.  Cardiovascular:  Cardiac rate controlled  Pulmonary/Chest:  Decreased breath sounds at bases but clear to auscultation otherwise.  Abd- soft and non distended Extr- L leg soft cast  Assessment/Plan: 1. Functional deficits secondary to distal left femur fx which require 3+ hours per day of interdisciplinary therapy in a comprehensive inpatient rehab setting. 2.  DM- stable 3. HTN controlled 4. CKD   FIM: FIM - Bathing Bathing Steps Patient Completed: Chest;Right Arm;Left Arm;Abdomen;Front perineal area;Right upper leg;Right lower leg (including foot) Bathing: 3: Mod-Patient completes 5-7 60f 10 parts or 50-74%  FIM - Upper Body Dressing/Undressing Upper body dressing/undressing steps patient completed: Thread/unthread right sleeve of pullover shirt/dresss;Thread/unthread left sleeve of pullover shirt/dress;Pull shirt over trunk;Put head through opening of pull over shirt/dress Upper body dressing/undressing: 5: Set-up assist to: Obtain clothing/put away FIM - Lower Body Dressing/Undressing Lower body dressing/undressing steps patient completed: Thread/unthread right pants leg;Thread/unthread left pants leg;Pull pants up/down;Don/Doff right sock Lower body dressing/undressing: 3: Mod-Patient completed 50-74% of tasks  FIM - Toileting Toileting steps completed by patient: Adjust clothing prior to toileting Toileting Assistive Devices: Grab bar or rail for support Toileting: 2: Max-Patient completed 1 of 3 steps  FIM - Archivist  Transfers Assistive Devices: Bedside commode;Sliding board Toilet Transfers: 5-To toilet/BSC: Supervision (verbal cues/safety issues);5-From toilet/BSC: Supervision (verbal cues/safety issues)  FIM - Banker Devices: Sliding board Bed/Chair Transfer: 5: Supine > Sit: Supervision (verbal cues/safety issues);5: Sit > Supine:  Supervision (verbal cues/safety issues);4: Bed > Chair or W/C: Min A (steadying Pt. > 75%);5: Chair or W/C > Bed: Supervision (verbal cues/safety issues)  FIM - Locomotion: Wheelchair Distance: 150 Locomotion: Wheelchair: 5: Travels 150 ft or more: maneuvers on rugs and over door sills with supervision, cueing or coaxing FIM - Locomotion: Ambulation Ambulation/Gait Assistance: Not tested (comment) Locomotion: Ambulation: 0: Activity did not occur (pt unable)  Comprehension Comprehension Mode: Auditory Comprehension: 6-Follows complex conversation/direction: With extra time/assistive device  Expression Expression Mode: Verbal Expression: 6-Expresses complex ideas: With extra time/assistive device  Social Interaction Social Interaction: 6-Interacts appropriately with others with medication or extra time (anti-anxiety, antidepressant).  Problem Solving Problem Solving: 5-Solves complex 90% of the time/cues < 10% of the time  Memory Memory: 6-More than reasonable amt of time  Medical Problem List and Plan:  1. Left distal femur fracture after fall. Status post ORIF 07/10/2012  2. DVT Prophylaxis/Anticoagulation: Subcutaneous Lovenox. Monitor platelet counts and any signs of bleeding.   -doppler test limited but negative 3. Pain Management: Oxycodone as needed. Monitor with increased mobility   -MS contin  30mg  q12. May consider further wean, could be causing some pruritis  -femur xr stable  -pain better with decreased edema  -monitor effusion around the knee--likely will need outpt assessment/MRI 4. Neuropsych: This patient is capable of making decisions on his/her own behalf.  5. Acute blood loss anemia. hgb is trending up 6. Chronic kidney disease and renal transplant 1996. Baseline creatinine 3.00. Followup per renal services. Continue prednisone/Sirolimus as directed  7. Hypertension/atrial fibrillation. Amiodarone 200 mg daily, coreg 25 mg twice a day, Lasix 80 mg twice a day,  hydralazine 25 mg 3 times a day, M.D. or 60 mg daily. Monitor with increased mobility. Patient no chest pain or shortness of breath  8. Nonischemic cardiomyopathy. Continue aspirin therapy.  9. Systolic congestive heart failure. Continue Lasix as advised.  `  -lasix slightly increased to assist with LLE edema   -LLE with coban wrapped with good results  -still hypoxic at times but has poor breathing techniques--sats returned to normal with simple cueing and instruction  -recheck CXR today.  -encouraged better posture, IS   -wean oxygen as able. Likely will need to go home on home oxygen at this point 10. Non-insulin-dependent diabetes mellitus. Hemoglobin A1c 5.7. Blood sugars under good control. Change cbg's to qam only 11. History of gout. Continue allopurinol. Monitor for any gout flareups.  12. Hyperlipidemia. Lipitor  13. Obstructive sleep apnea. CPAP  14. Leukocytosis: decreased (13k)  -urine clean  -IS  -likely steroid effect 15.  Nausea- improved LOS (Days) 16 A FACE TO FACE EVALUATION WAS PERFORMED  [name] 07/31/2012 9:04 AM

## 2012-07-31 NOTE — Progress Notes (Signed)
Occupational Therapy Session Note  Patient Details  Name: Chad [name] MRN: [id number] Date of Birth: [birth year]  Today's Date: 07/31/2012 Time: 4132-4401 Time Calculation (min): 65 min  Skilled Therapeutic Interventions/Progress Updates:Patient scheduled for ADL today and participated with extra time to take medications and complete toilet transfer and BM.  Due to toileting falling during ADL and requiring quite a bit of time, patient did not have the opportunity to complete washing and dressing his legs and feet.    Patient did a very good job describing to this clinician how to set up the drop arm toilet for transfers, blocking the drop arm with clinician's feet to prevent it from sliding, sliding board set up with pad or towel to prevent sticking or shearing during the transfer and drop arm to w/c transfer setup.      As well, patient was able to complete lateral lean to his right side that clinician was able to cleanse his periarea after BM, and he was able to  bridge/boost up with upper body strength on drop commode while this clinician pulled up his shorts.     Therapy Documentation Precautions:  Precautions Precautions: Fall Required Braces or Orthoses: Other Brace/Splint Other Brace/Splint: Bledsoe Brace Restrictions Weight Bearing Restrictions: Yes LLE Weight Bearing: Touchdown weight bearing Pain:5/10. Constant left leg. RN gave schedled meds   See FIM for current functional status  Therapy/Group: Individual Therapy  Bud Face [facility] 07/31/2012, 12:39 PM

## 2012-08-01 ENCOUNTER — Inpatient Hospital Stay (HOSPITAL_COMMUNITY): Payer: Medicaid Other | Admitting: Occupational Therapy

## 2012-08-01 LAB — GLUCOSE, CAPILLARY: Glucose-Capillary: 92 mg/dL (ref 70–99)

## 2012-08-01 NOTE — Progress Notes (Signed)
Patient pre-medicated with 10 mg Oxy IR for dressing change. Posterior calf open area cleansed with normal Saline. Alevyn dressings to upper left leg and inner aspect left thigh changed. Staples intact to three areas. Saturated Alevyn dated 07-27-12 removed and changed (lower aspect of left outer leg). Slight amount of drainage present. Cleansed and re-dressed. To notify MD. Sherlyn Lees, RN

## 2012-08-01 NOTE — Progress Notes (Signed)
Orthopedic Tech Progress Note Patient Details:  Chad [name] December 22, 1955 [id number]  Ortho Devices Type of Ortho Device: Unna boot Ortho Device/Splint Location: left leg Ortho Device/Splint Interventions: Application   Chad [name] 08/01/2012, 3:10 PM

## 2012-08-01 NOTE — Progress Notes (Signed)
Patient ID: Chad [name], male   DOB: 1955/08/07, 57 y.o.   MRN: [id number] Patient ID: Chad [name], male   DOB: [birth year], 57 y.o.   MRN: [id number] Patient ID: Chad [name], male   DOB: [birth year], 57 y.o.   MRN: [id number] Subjective/Complaints:  7/6.  57 y/o admit s/p L femur fx.  H/o PAF, HCAP with ARF/ ARDS; h/o OSA, HTN and CKD. Feels well; no compalints.   Intake/Output Summary (Last 24 hours) at 08/01/12 0828 Last data filed at 08/01/12 0800  Gross per 24 hour  Intake    480 ml  Output   2100 ml  Net  -1620 ml   Patient Vitals for the past 24 hrs:  BP Temp Temp src Pulse Resp SpO2 Weight  08/01/12 0500 126/68 mmHg 99 F (37.2 C) Oral 77 17 96 % 129.4 kg (285 lb 4.4 oz)  07/31/12 2034 124/82 mmHg - - - - - -  07/31/12 1442 141/79 mmHg 98.8 F (37.1 C) Oral 70 18 95 % -    Objective: Vital Signs: Blood pressure 126/68, pulse 77, temperature 99 F (37.2 C), temperature source Oral, resp. rate 17, weight 129.4 kg (285 lb 4.4 oz), SpO2 96.00%. No results found. No results found for this basename: WBC, HGB, HCT, PLT,  in the last 72 hours No results found for this basename: NA, K, CL, CO, GLUCOSE, BUN, CREATININE, CALCIUM,  in the last 72 hours CBG (last 3)   Recent Labs  07/30/12 0742 07/31/12 0731 08/01/12 0729  GLUCAP 94 105* 92    Wt Readings from Last 3 Encounters:  08/01/12 129.4 kg (285 lb 4.4 oz)  07/13/12 122.471 kg (270 lb)  07/13/12 122.471 kg (270 lb)    Physical Exam:  Constitutional: He is oriented to person, place, and time.  HENT:   N/c O2 Head: Normocephalic.  Eyes: EOM are normal.  Neck: Neck supple. No thyromegaly present.  Cardiovascular:  Cardiac rate controlled  Pulmonary/Chest:  Decreased breath sounds at bases but clear to auscultation otherwise.  Abd- soft and non distended Extr- L leg soft cast  Assessment/Plan: 1. Functional deficits secondary to distal left femur fx which require 3+ hours per day of interdisciplinary  therapy in a comprehensive inpatient rehab setting. 2.  DM- stable 3. HTN controlled 4. CKD   FIM: FIM - Bathing Bathing Steps Patient Completed: Chest;Right Arm;Left Arm;Abdomen;Front perineal area;Right upper leg;Right lower leg (including foot) Bathing: 3: Mod-Patient completes 5-7 5f 10 parts or 50-74%  FIM - Upper Body Dressing/Undressing Upper body dressing/undressing steps patient completed: Thread/unthread right sleeve of pullover shirt/dresss;Thread/unthread left sleeve of pullover shirt/dress;Put head through opening of pull over shirt/dress;Pull shirt over trunk Upper body dressing/undressing: 5: Set-up assist to: Obtain clothing/put away FIM - Lower Body Dressing/Undressing Lower body dressing/undressing steps patient completed: Thread/unthread right pants leg;Thread/unthread left pants leg Lower body dressing/undressing: 2: Max-Patient completed 25-49% of tasks  FIM - Toileting Toileting steps completed by patient: Adjust clothing prior to toileting Toileting Assistive Devices: Grab bar or rail for support Toileting: 1: Total-Patient completed zero steps, helper did all 3  FIM - Diplomatic Services operational officer Devices: Human resources officer Transfers: 5-To toilet/BSC: Supervision (verbal cues/safety issues);5-From toilet/BSC: Supervision (verbal cues/safety issues)  FIM - Banker Devices: Sliding board Bed/Chair Transfer: 5: Chair or W/C > Bed: Supervision (verbal cues/safety issues);4: Sit > Supine: Min A (steadying pt. > 75%/lift 1 leg)  FIM - Locomotion: Wheelchair Distance: 150 Locomotion: Wheelchair: 5:  Travels 150 ft or more: maneuvers on rugs and over door sills with supervision, cueing or coaxing FIM - Locomotion: Ambulation Ambulation/Gait Assistance: Not tested (comment) Locomotion: Ambulation: 0: Activity did not occur (pt unable)  Comprehension Comprehension Mode:  Auditory Comprehension: 6-Follows complex conversation/direction: With extra time/assistive device  Expression Expression Mode: Verbal Expression: 6-Expresses complex ideas: With extra time/assistive device  Social Interaction Social Interaction: 6-Interacts appropriately with others with medication or extra time (anti-anxiety, antidepressant).  Problem Solving Problem Solving: 5-Solves complex 90% of the time/cues < 10% of the time  Memory Memory: 6-More than reasonable amt of time  Medical Problem List and Plan:  1. Left distal femur fracture after fall. Status post ORIF 07/10/2012  2. DVT Prophylaxis/Anticoagulation: Subcutaneous Lovenox. Monitor platelet counts and any signs of bleeding.   -doppler test limited but negative 3. Pain Management: Oxycodone as needed. Monitor with increased mobility   -MS contin  30mg  q12. May consider further wean, could be causing some pruritis  -femur xr stable  -pain better with decreased edema  -monitor effusion around the knee--likely will need outpt assessment/MRI 4. Neuropsych: This patient is capable of making decisions on his/her own behalf.  5. Acute blood loss anemia. hgb is trending up 6. Chronic kidney disease and renal transplant 1996. Baseline creatinine 3.00. Followup per renal services. Continue prednisone/Sirolimus as directed  7. Hypertension/atrial fibrillation. Amiodarone 200 mg daily, coreg 25 mg twice a day, Lasix 80 mg twice a day, hydralazine 25 mg 3 times a day, M.D. or 60 mg daily. Monitor with increased mobility. Patient no chest pain or shortness of breath  8. Nonischemic cardiomyopathy. Continue aspirin therapy.  9. Systolic congestive heart failure. Continue Lasix as advised.  `  -lasix slightly increased to assist with LLE edema   -LLE with coban wrapped with good results  -still hypoxic at times but has poor breathing techniques--sats returned to normal with simple cueing and instruction  -recheck CXR  today.  -encouraged better posture, IS   -wean oxygen as able. Likely will need to go home on home oxygen at this point 10. Non-insulin-dependent diabetes mellitus. Hemoglobin A1c 5.7. Blood sugars under good control. Change cbg's to qam only 11. History of gout. Continue allopurinol. Monitor for any gout flareups.  12. Hyperlipidemia. Lipitor  13. Obstructive sleep apnea. CPAP  14. Leukocytosis: decreased (13k)  -urine clean  -IS  -likely steroid effect 15.  Nausea- improved LOS (Days) 17 A FACE TO FACE EVALUATION WAS PERFORMED  [name] 08/01/2012 8:28 AM

## 2012-08-01 NOTE — Progress Notes (Signed)
Occupational Therapy Session Note  Patient Details  Name: Chad [name] MRN: [id number] Date of Birth: 07/17/1955  Today's Date: 08/01/2012 Time: 1110-1210 Time Calculation (min): 60 min  Skilled Therapeutic Interventions/Progress Updates: completion of ADL sitting on extended size drop arm commode today with focus on using sponge and reacher for Lower body bathing and dressing.  Patient stated he did not feel as if sponge gets feet clean enough.  This clinician shared technique of wrapping wash cloth on reacher, wetting middle of long bath towel or purchasing special sponge from medical supply store to get in between toes.    Patient exhibited great effort in drop arm to w/c sliding board transfer today and required Assist for sliding board set up and w/c stabilization.     Therapy Documentation Precautions:  Precautions Precautions: Fall Required Braces or Orthoses: Other Brace/Splint Other Brace/Splint: Bledsoe Brace Restrictions Weight Bearing Restrictions: Yes LLE Weight Bearing: Touchdown weight bearing  Pain: denied during therapy session   See FIM for current functional status  Therapy/Group: Individual Therapy  Bud Face [facility] 08/01/2012, 4:19 PM

## 2012-08-02 ENCOUNTER — Inpatient Hospital Stay (HOSPITAL_COMMUNITY): Payer: Medicaid Other | Admitting: Physical Therapy

## 2012-08-02 ENCOUNTER — Inpatient Hospital Stay (HOSPITAL_COMMUNITY): Payer: Medicaid Other

## 2012-08-02 ENCOUNTER — Inpatient Hospital Stay (HOSPITAL_COMMUNITY): Payer: Self-pay | Admitting: Physical Therapy

## 2012-08-02 MED ORDER — MORPHINE SULFATE ER 15 MG PO TBCR
15.0000 mg | EXTENDED_RELEASE_TABLET | Freq: Two times a day (BID) | ORAL | Status: DC
  Administered 2012-08-02 – 2012-08-04 (×4): 15 mg via ORAL
  Filled 2012-08-02 (×4): qty 1

## 2012-08-02 NOTE — Progress Notes (Signed)
Occupational Therapy Session Note  Patient Details  Name: Chad [name] MRN: [id number] Date of Birth: 03-20-1955  Today's Date: 08/02/2012  Session 1 Time: 0700-0757 Time Calculation (min): 57 min  Short Term Goals: Week 3:  OT Short Term Goal 1 (Week 3): Pt will perform simple meal prep at w/c level with supervision OT Short Term Goal 2 (Week 3): Pt will perform simple home mgmt tasks at w/c level with supervison OT Short Term Goal 3 (Week 3): Pt will perform LB dressing with min A utilizing lateral leans to pull up pants OT Short Term Goal 4 (Week 3): Pt will perform toileting with mod A  Skilled Therapeutic Interventions/Progress Updates:    Pt resting in bed upon arrival.  Pt engaged in bathing and dressing seated EOB with lateral leans to pull up pants.  Pt uses reacher appropriately to assist with threading pants.  Pt performs adequate lateral leans on EOB to bathe buttocks.  Pt performed sliding board transfer to w/c with supervision to complete grooming tasks at sink.  Pt O2 sats continue to drop below 90% when on 2L O2 after activity but recover with 30 seconds of pursed lip breathing.  Pt is not symptomatic when O2 levels decrease.  Therapy Documentation Precautions:  Precautions Precautions: Fall Required Braces or Orthoses: Other Brace/Splint Other Brace/Splint: Bledsoe Brace Restrictions Weight Bearing Restrictions: Yes LLE Weight Bearing: Touchdown weight bearing Pain: Pain Assessment Pain Assessment: No/denies pain  See FIM for current functional status  Therapy/Group: Individual Therapy  Session 2 Time: 1400-1430 Pt denies pain Individual Therapu  Pt engaged in w/c mobility for home mgmt tasks in therapy gym and ADL apartment.  Pt performed simple tasks with and without O2.  Pt O2 sats continue to drop below 90% with activity on 2L O2 and/or RA.  Discussed assistance patient will require upon discharge and he states he is arranging for individuals to  assist.  [name] 08/02/2012, 8:00 AM

## 2012-08-02 NOTE — Progress Notes (Signed)
Patient ID: Chad [name], male   DOB: January 14, 1956, 57 y.o.   MRN: [id number] Subjective/Complaints: Leg/thigh pain stable A 12 point review of systems has been performed and if not noted above is otherwise negative.   Objective: Vital Signs: Blood pressure 125/71, pulse 78, temperature 98.6 F (37 C), temperature source Oral, resp. rate 17, weight 129.5 kg (285 lb 7.9 oz), SpO2 95.00%. No results found. No results found for this basename: WBC, HGB, HCT, PLT,  in the last 72 hours No results found for this basename: NA, K, CL, CO, GLUCOSE, BUN, CREATININE, CALCIUM,  in the last 72 hours CBG (last 3)   Recent Labs  07/31/12 0731 08/01/12 0729 08/02/12 0749  GLUCAP 105* 92 98    Wt Readings from Last 3 Encounters:  08/02/12 129.5 kg (285 lb 7.9 oz)  07/13/12 122.471 kg (270 lb)  07/13/12 122.471 kg (270 lb)    Physical Exam:  Constitutional: He is oriented to person, place, and time.  HENT:  Head: Normocephalic.  Eyes: EOM are normal.  Neck: Neck supple. No thyromegaly present.  Cardiovascular:  Cardiac rate controlled  Pulmonary/Chest:  Decreased breath sounds at bases but clear to auscultation otherwise.  Abdominal: Soft. Bowel sounds are normal. He exhibits no distension.  Neurological: He is alert and oriented to person, place, and time.  Patient follows full commands. Motor function within normal limits in UE's and RLE. Sensory function nl  Skin:  Surgical site is dressed. Left leg remains tender.  Musc: left leg with decreased edema as whole although there is an effusion about the knee. Poor posture with kyphosis and he forward/shoulders IR position Psychiatric: he anxious but otherwise His behavior is normal. Judgment and thought content normal  Neuro:  Eyes without evidence of nystagmus  Tone is normal without evidence of spasticity  Cerebellar exam shows no evidence of ataxia on finger nose finger or heel to shin testing  No evidence of trunkal ataxia  Skin-  no evidence of rash     Assessment/Plan: 1. Functional deficits secondary to distal left femur fx which require 3+ hours per day of interdisciplinary therapy in a comprehensive inpatient rehab setting. Physiatrist is providing close team supervision and 24 hour management of active medical problems listed below. Physiatrist and rehab team continue to assess barriers to discharge/monitor patient progress toward functional and medical goals.    FIM: FIM - Bathing Bathing Steps Patient Completed: Chest;Right Arm;Left Arm;Abdomen;Front perineal area;Right upper leg;Buttocks Bathing: 3: Mod-Patient completes 5-7 10f 10 parts or 50-74%  FIM - Upper Body Dressing/Undressing Upper body dressing/undressing steps patient completed: Thread/unthread right sleeve of pullover shirt/dresss;Put head through opening of pull over shirt/dress;Thread/unthread left sleeve of pullover shirt/dress;Pull shirt over trunk Upper body dressing/undressing: 5: Set-up assist to: Obtain clothing/put away FIM - Lower Body Dressing/Undressing Lower body dressing/undressing steps patient completed: Thread/unthread right pants leg;Thread/unthread left pants leg;Pull pants up/down;Don/Doff right sock Lower body dressing/undressing: 3: Mod-Patient completed 50-74% of tasks  FIM - Toileting Toileting steps completed by patient: Adjust clothing prior to toileting Toileting Assistive Devices: Grab bar or rail for support Toileting: 0: Activity did not occur  FIM - Diplomatic Services operational officer Devices: Human resources officer Transfers: 0-Activity did not occur  FIM - Banker Devices: Sliding board Bed/Chair Transfer: 5: Supine > Sit: Supervision (verbal cues/safety issues);5: Bed > Chair or W/C: Supervision (verbal cues/safety issues)  FIM - Locomotion: Wheelchair Distance: 150 Locomotion: Wheelchair: 5: Travels 150 ft or more: maneuvers on  rugs and  over door sills with supervision, cueing or coaxing FIM - Locomotion: Ambulation Ambulation/Gait Assistance: Not tested (comment) Locomotion: Ambulation: 0: Activity did not occur (pt unable)  Comprehension Comprehension Mode: Auditory Comprehension: 6-Follows complex conversation/direction: With extra time/assistive device  Expression Expression Mode: Verbal Expression: 6-Expresses complex ideas: With extra time/assistive device  Social Interaction Social Interaction: 6-Interacts appropriately with others with medication or extra time (anti-anxiety, antidepressant).  Problem Solving Problem Solving: 6-Solves complex problems: With extra time  Memory Memory: 6-More than reasonable amt of time  Medical Problem List and Plan:  1. Left distal femur fracture after fall. Status post ORIF 07/10/2012  2. DVT Prophylaxis/Anticoagulation: Subcutaneous Lovenox. Monitor platelet counts and any signs of bleeding.   -doppler test limited but negative 3. Pain Management: Oxycodone as needed. Monitor with increased mobility   -MS contin reduce to 15mg  q12.  could be causing some pruritis  -femur xr stable  -pain better with decreased edema  -monitor effusion around the knee--likely will need outpt assessment/MRI 4. Neuropsych: This patient is capable of making decisions on his/her own behalf.  5. Acute blood loss anemia. hgb is trending up 6. Chronic kidney disease and renal transplant 1996. Baseline creatinine 3.00. Followup per renal services. Continue prednisone/Sirolimus as directed  7. Hypertension/atrial fibrillation. Amiodarone 200 mg daily, coreg 25 mg twice a day, Lasix 80 mg twice a day, hydralazine 25 mg 3 times a day, M.D. or 60 mg daily. Monitor with increased mobility. Patient no chest pain or shortness of breath  8. Nonischemic cardiomyopathy. Continue aspirin therapy.  9. Systolic congestive heart failure. Continue Lasix as advised.  `  -lasix slightly increased to assist with  LLE edema   -LLE with coban wrapped with good results  -still hypoxic at times but has poor breathing techniques--sats returned to normal with simple cueing and instruction  -recheck CXR today.  -encouraged better posture, IS   -wean oxygen as able. Likely will need to go home on home oxygen at this point 10. Non-insulin-dependent diabetes mellitus. Hemoglobin A1c 5.7. Blood sugars under good control. Change cbg's to qam only 11. History of gout. Continue allopurinol. Monitor for any gout flareups.  12. Hyperlipidemia. Lipitor  13. Obstructive sleep apnea. CPAP  14. Leukocytosis: decreased (13k)  -urine clean  -IS  -likely steroid effect 15.  Nausea- improved LOS (Days) 18 A FACE TO FACE EVALUATION WAS PERFORMED  Demetruis Depaul E 08/02/2012 9:15 AM

## 2012-08-02 NOTE — Progress Notes (Signed)
Recreational Therapy Session Note  Patient Details  Name: CAMARA ROSANDER MRN: [id number] Date of Birth: [birth year] Today's Date: 08/02/2012 Time:  1007-1030 Pain: no c/o Skilled Therapeutic Interventions/Progress Updates: Session spent discussing upcoming discharge.  Pt emotional today stating frustration with his son and his lack of follow through on providing requested information for discharge planning.  Emotional support provided.  Therapy/Group: Individual Therapy  Zari Cly 08/02/2012, 3:51 PM

## 2012-08-02 NOTE — Progress Notes (Signed)
Patient refuses to wear CPAP tonight. 

## 2012-08-02 NOTE — Progress Notes (Signed)
Physical Therapy Session Note  Patient Details  Name: Chad [name] MRN: [id number] Date of Birth: 1955/11/15  Today's Date: 08/02/2012 Time: 9528-4132 Time Calculation (min): 45 min  Short Term Goals: Week 3:     Skilled Therapeutic Interventions/Progress Updates:    Pt reports 5/10 painscore and c/o would on L LE continues to weep, nsg made aware of pt complaint.  Pt continues with wheelchair management training with pt requiring supervision assist and supervision to mod assist with managing B LE leg rests, varies due to pt requiring increased vcs at times and requests assistance when having difficulty.  Transfers with sliding board with supervision to  min assist, pt required intermittent assistance for proper placement of sliding board.  Sit to supine with mod to max assist as pt requires assistance with B LEs, pt is able to use arm rests to pull up in bed for repositioning.   Therapy Documentation Precautions:  Precautions Precautions: Fall Required Braces or Orthoses: Other Brace/Splint Other Brace/Splint: Bledsoe Brace Restrictions Weight Bearing Restrictions: Yes LLE Weight Bearing: Touchdown weight bearing    Vital Signs: Therapy Vitals Temp: 98.4 F (36.9 C) Temp src: Oral Pulse Rate: 72 Resp: 20 BP: 150/85 mmHg Patient Position, if appropriate: Sitting Oxygen Therapy SpO2: 97 % O2 Device: Nasal cannula O2 Flow Rate (L/min): 2 L/min Pain: Pain Assessment Pain Score: 5  Pain Type: Surgical pain Pain Location: Leg Pain Orientation: Left Pain Descriptors / Indicators: Aching Patients Stated Pain Goal: 2 Pain Intervention(s): Medication (See eMAR)                   See FIM for current functional status  Therapy/Group: Individual Therapy  Jackelyn Knife 08/02/2012, 4:28 PM

## 2012-08-02 NOTE — Progress Notes (Signed)
Physical Therapy Session Note  Patient Details  Name: KAYSHAUN POLANCO MRN: [id number] Date of Birth: [birth year]  Today's Date: 08/02/2012 Time: 4098-1191 Time Calculation (min): 58 min   Skilled Therapeutic Interventions/Progress Updates:    No measurements from house yet, need these measurements to determine appropriate wheelchair size so that pt can access house pt reports he is working on them. Pt retrieved dirty laundry in room and transported it to washer with supervision. Wheelchair propulsion modified independent in room and hallway for strengthening and endurance x 200'. Sliding board transfer to/from low compliant couch with cues needed for removing Lt. Leg rest and assist to hold wheelchair to keep from sliding. Simulated car sliding board transfer with supervision and PT to hold wheelchair from sliding. Pt making good progress.    Goals updated, gait goal discharged as pt no longer anticipated to be ambulatory by D/C.   Therapy Documentation Precautions:  Precautions Precautions: Fall Required Braces or Orthoses: Other Brace/Splint Other Brace/Splint: Bledsoe Brace Restrictions Weight Bearing Restrictions: Yes LLE Weight Bearing: Touchdown weight bearing Pain:  4-5/10 Lt. Knee, pt repositioned after activity  See FIM for current functional status  Therapy/Group: Individual Therapy  [name] 08/02/2012, 1:05 PM

## 2012-08-02 NOTE — Progress Notes (Signed)
Patient refused cpap tonight. RT will continue to monitor. 

## 2012-08-03 ENCOUNTER — Inpatient Hospital Stay (HOSPITAL_COMMUNITY): Payer: Medicaid Other | Admitting: Occupational Therapy

## 2012-08-03 ENCOUNTER — Inpatient Hospital Stay (HOSPITAL_COMMUNITY): Payer: Self-pay | Admitting: Physical Therapy

## 2012-08-03 ENCOUNTER — Inpatient Hospital Stay (HOSPITAL_COMMUNITY): Payer: Medicaid Other

## 2012-08-03 DIAGNOSIS — S72453A Displaced supracondylar fracture without intracondylar extension of lower end of unspecified femur, initial encounter for closed fracture: Secondary | ICD-10-CM

## 2012-08-03 NOTE — Progress Notes (Signed)
Physical Therapy Session Note  Patient Details  Name: Chad [name] MRN: [id number] Date of Birth: [birth year]  Today's Date: 08/03/2012 Time: 5284-1324 Time Calculation (min): 40 min   Skilled Therapeutic Interventions/Progress Updates:    Pt frustrated this afternoon because he feels he can't get family support to get door frame measurements. "Miss Toney Reil" still in room waiting for ride, practiced wheelchair <> 3n1 sliding board transfer with caregiver providing supervision/stabilizing assist of equipment. Only needed cues for positioning of bedside commode. Pt gathered dirty clothes in room and placed in closet wheelchair level modified independent. Sliding board transfer wheelchair to bed with supervision and help with set up, pt directing. (Nursing requested pt return to bed due to leaking dressing). Pt mod assist to sit >supine going opposite direction he will be going at home (more difficult despite using sheet).   Therapy Documentation Precautions:  Precautions Precautions: Fall Required Braces or Orthoses: Other Brace/Splint Other Brace/Splint: Bledsoe Brace Restrictions Weight Bearing Restrictions: Yes LLE Weight Bearing: Touchdown weight bearing Pain: Pain Assessment Pain Score: 6  Faces Pain Scale: Hurts even more Pain Location: Leg Pain Orientation: Left Pain Descriptors / Indicators: Aching Pain Onset: With Activity Pain Intervention(s): Repositioned;Rest  See FIM for current functional status  Therapy/Group: Individual Therapy  [name] 08/03/2012, 3:10 PM

## 2012-08-03 NOTE — Progress Notes (Signed)
Patient ID: Chad [name], male   DOB: 08-Sep-1955, 57 y.o.   MRN: [id number] Subjective/Complaints: No significant pain c/os, on lower dose Morphine A 12 point review of systems has been performed and if not noted above is otherwise negative.   Objective: Vital Signs: Blood pressure 147/87, pulse 78, temperature 98.2 F (36.8 C), temperature source Oral, resp. rate 17, weight 128.912 kg (284 lb 3.2 oz), SpO2 94.00%. No results found. No results found for this basename: WBC, HGB, HCT, PLT,  in the last 72 hours No results found for this basename: NA, K, CL, CO, GLUCOSE, BUN, CREATININE, CALCIUM,  in the last 72 hours CBG (last 3)   Recent Labs  08/01/12 0729 08/02/12 0749 08/03/12 0714  GLUCAP 92 98 95    Wt Readings from Last 3 Encounters:  08/03/12 128.912 kg (284 lb 3.2 oz)  07/13/12 122.471 kg (270 lb)  07/13/12 122.471 kg (270 lb)    Physical Exam:  Constitutional: He is oriented to person, place, and time.  HENT:  Head: Normocephalic.  Eyes: EOM are normal.  Neck: Neck supple. No thyromegaly present.  Cardiovascular:  Cardiac rate controlled  Pulmonary/Chest:  Decreased breath sounds at bases but clear to auscultation otherwise.  Abdominal: Soft. Bowel sounds are normal. He exhibits no distension.  Neurological: He is alert and oriented to person, place, and time.  Patient follows full commands. Motor function within normal limits in UE's and RLE. Sensory function nl  Skin:  Surgical site is dressed. Left leg remains tender.  Musc: left leg with decreased edema as whole although there is an effusion about the knee. Poor posture with kyphosis and he forward/shoulders IR position Psychiatric: he anxious but otherwise His behavior is normal. Judgment and thought content normal  Neuro:  Eyes without evidence of nystagmus  Tone is normal without evidence of spasticity  Cerebellar exam shows no evidence of ataxia on finger nose finger or heel to shin testing  No  evidence of trunkal ataxia  Skin- no evidence of rash     Assessment/Plan: 1. Functional deficits secondary to distal left femur fx which require 3+ hours per day of interdisciplinary therapy in a comprehensive inpatient rehab setting. Physiatrist is providing close team supervision and 24 hour management of active medical problems listed below. Physiatrist and rehab team continue to assess barriers to discharge/monitor patient progress toward functional and medical goals. Team conference today please see physician documentation under team conference tab, met with team face-to-face to discuss problems,progress, and goals. Formulized individual treatment plan based on medical history, underlying problem and comorbidities.   FIM: FIM - Bathing Bathing Steps Patient Completed: Chest;Right Arm;Left Arm;Abdomen;Front perineal area;Right upper leg;Buttocks Bathing: 3: Mod-Patient completes 5-7 32f 10 parts or 50-74%  FIM - Upper Body Dressing/Undressing Upper body dressing/undressing steps patient completed: Thread/unthread right sleeve of pullover shirt/dresss;Put head through opening of pull over shirt/dress;Thread/unthread left sleeve of pullover shirt/dress;Pull shirt over trunk Upper body dressing/undressing: 5: Set-up assist to: Obtain clothing/put away FIM - Lower Body Dressing/Undressing Lower body dressing/undressing steps patient completed: Thread/unthread right pants leg;Thread/unthread left pants leg;Pull pants up/down;Don/Doff right sock Lower body dressing/undressing: 3: Mod-Patient completed 50-74% of tasks  FIM - Toileting Toileting steps completed by patient: Adjust clothing prior to toileting Toileting Assistive Devices: Grab bar or rail for support Toileting: 0: Activity did not occur  FIM - Diplomatic Services operational officer Devices: Human resources officer Transfers: 0-Activity did not occur  FIM - Banker  Devices: Sliding  board Bed/Chair Transfer: 5: Bed > Chair or W/C: Supervision (verbal cues/safety issues);5: Chair or W/C > Bed: Supervision (verbal cues/safety issues)  FIM - Locomotion: Wheelchair Distance: 150 Locomotion: Wheelchair: 6: Travels 150 ft or more, turns around, maneuvers to table, bed or toilet, negotiates 3% grade: maneuvers on rugs and over door sills independently FIM - Locomotion: Ambulation Ambulation/Gait Assistance: Not tested (comment) Locomotion: Ambulation: 0: Activity did not occur (pt unable)  Comprehension Comprehension Mode: Auditory Comprehension: 7-Follows complex conversation/direction: With no assist  Expression Expression Mode: Verbal Expression: 7-Expresses complex ideas: With no assist  Social Interaction Social Interaction: 7-Interacts appropriately with others - No medications needed.  Problem Solving Problem Solving: 7-Solves complex problems: Recognizes & self-corrects  Memory Memory: 7-Complete Independence: No helper  Medical Problem List and Plan:  1. Left distal femur fracture after fall. Status post ORIF 07/10/2012  2. DVT Prophylaxis/Anticoagulation: Subcutaneous Lovenox. Monitor platelet counts and any signs of bleeding.   -doppler test limited but negative 3. Pain Management: Oxycodone as needed. Monitor with increased mobility   -MS contin reduce to 15mg  q12.  could be causing some pruritis  -femur xr stable  -pain better with decreased edema  -monitor effusion around the knee--likely will need outpt assessment/MRI 4. Neuropsych: This patient is capable of making decisions on his/her own behalf.  5. Acute blood loss anemia. hgb is trending up 6. Chronic kidney disease and renal transplant 1996. Baseline creatinine 3.00. Followup per renal services. Continue prednisone/Sirolimus as directed  7. Hypertension/atrial fibrillation. Amiodarone 200 mg daily, coreg 25 mg twice a day, Lasix 80 mg twice a day, hydralazine 25 mg 3 times a  day, M.D. or 60 mg daily. Monitor with increased mobility. Patient no chest pain or shortness of breath  8. Nonischemic cardiomyopathy. Continue aspirin therapy.  9. Systolic congestive heart failure. Continue Lasix as advised.  `  -lasix slightly increased to assist with LLE edema   -LLE with coban wrapped with good results  -still hypoxic at times but has poor breathing techniques--sats returned to normal with simple cueing and instruction  -recheck CXR today.  -encouraged better posture, IS   -wean oxygen as able. Likely will need to go home on home oxygen at this point 10. Non-insulin-dependent diabetes mellitus. Hemoglobin A1c 5.7. Blood sugars under good control. Change cbg's to qam only 11. History of gout. Continue allopurinol. Monitor for any gout flareups.  12. Hyperlipidemia. Lipitor  13. Obstructive sleep apnea. CPAP  14. Leukocytosis: decreased (13k)  -urine clean  -IS  -likely steroid effect 15.  Nausea- improved LOS (Days) 19 A FACE TO FACE EVALUATION WAS PERFORMED  Irby Fails E 08/03/2012 9:02 AM

## 2012-08-03 NOTE — Progress Notes (Signed)
Occupational Therapy Session Note  Patient Details  Name: Chad [name] MRN: [id number] Date of Birth: February 24, 1955  Today's Date: 08/03/2012 Time: 1500-1600 Time Calculation (min): 60 min  Short Term Goals: Week 3:  OT Short Term Goal 1 (Week 3): Pt will perform simple meal prep at w/c level with supervision OT Short Term Goal 2 (Week 3): Pt will perform simple home mgmt tasks at w/c level with supervison OT Short Term Goal 3 (Week 3): Pt will perform LB dressing with min A utilizing lateral leans to pull up pants OT Short Term Goal 4 (Week 3): Pt will perform toileting with mod A  Skilled Therapeutic Interventions/Progress Updates:  Patient resting in bed upon arrival.  Reluctant to get out of bed for this last therapy of the day yet with encouragement, patient agreed to practice bed mobility, slide board transfers><HD drop commode, and lateral leans for clothing management and hygiene.  Patient reports that his bed at home is high of the floor which will make the sliding board transfers a challenge when going uphill and can be a little dangerous when sliding downhill.  Min vcs needed for SB placement and to pull shorts down to cover the backs of his thighs to prepare for slide board transfers.  Patient reports that he has a difficult time cleaning himself after a BM however he will continue to try knowing that his caregiver can complete the task if he is not thorough.  Reviewed set up of patient's bedroom at home in hopes that the drop arm commode could be placed beside the bed with the w/c on other side of commode for an additional surface to stabilize on for lateral leans.  Steady assist for uphill SB transfer back to bed and patient's BUEs on pillows with heels floating, all items within reach.  Therapy Documentation Precautions:  Precautions Precautions: Fall Required Braces or Orthoses: Other Brace/Splint Other Brace/Splint: Bledsoe Brace Restrictions Weight Bearing Restrictions:  Yes LLE Weight Bearing: Touchdown weight bearing Pain: 8/10, RN notified and medication provided, repositioned and rest. ADL: See FIM for current functional status  Therapy/Group: Individual Therapy  Graylee Arutyunyan 08/03/2012, 4:39 PM

## 2012-08-03 NOTE — Progress Notes (Signed)
Occupational Therapy Session Note  Patient Details  Name: Chad [name] MRN: [id number] Date of Birth: 1956-01-05  Today's Date: 08/03/2012 Time: 1000-1045 Time Calculation (min): 45 min  Short Term Goals: Week 3:  OT Short Term Goal 1 (Week 3): Pt will perform simple meal prep at w/c level with supervision OT Short Term Goal 2 (Week 3): Pt will perform simple home mgmt tasks at w/c level with supervison OT Short Term Goal 3 (Week 3): Pt will perform LB dressing with min A utilizing lateral leans to pull up pants OT Short Term Goal 4 (Week 3): Pt will perform toileting with mod A  Skilled Therapeutic Interventions/Progress Updates:    Pt engaged in bathing and dressing w/c level at sink.  Pt uses AE appropriately to complete LB bathing and dressing tasks.  Focus on directing care appropriately.  Pt requires extra time to complete tasks. Discussed energy conservaiton strategies and proper breathing techniques.  Pt O2 sats continue to decrease with activity; <90% on RA and occasionally <90% on 2L O2.  Pt is not symptomatic when O2 sats drop below 90%.  Discussed toileting assistance and patient is realizes that he will need assistance with toilet hygiene.  Pt states that his care giver will be able to provide this assistance and he will discuss this with her.  Therapy Documentation Precautions:  Precautions Precautions: Fall Required Braces or Orthoses: Other Brace/Splint Other Brace/Splint: Bledsoe Brace Restrictions Weight Bearing Restrictions: Yes LLE Weight Bearing: Touchdown weight bearing   Pain: Pain Assessment Pain Score: 3  Pain Type: Surgical pain Pain Location: Leg Pain Orientation: Left Pain Descriptors / Indicators: Throbbing Pain Onset: On-going Patients Stated Pain Goal: 2 Pain Intervention(s): RN made aware;Repositioned  See FIM for current functional status  Therapy/Group: Individual Therapy  [name] 08/03/2012, 10:54 AM

## 2012-08-03 NOTE — Progress Notes (Signed)
Pt has refused use of CPAP.  No machine in room.

## 2012-08-03 NOTE — Progress Notes (Signed)
Physical Therapy Session Note  Patient Details  Name: Chad [name] MRN: [id number] Date of Birth: [birth year]  Today's Date: 08/03/2012 Time: 4132-4401 Time Calculation (min): 60 min   Skilled Therapeutic Interventions/Progress Updates:    "Chad [name]" arrived at beginning of session for family education. Session focused on safety with transfers. Pt performed sliding board transfers to/from SUV height simulated car, low compliant couch, bed, and bedside commode. Pt's caregiver educated on positioning to avoid sliding of wheelchair and appropriate placement of board and assist to steady board if needed. Pt able to direct caregiver and provide her with cues for her positioning appropriately. Both move slow but overall they are safe.   Pt on room air at start of treatment, SpO2 dropped multiple times to 82%. Pt able to increase to 91% with deep breaths but unable to maintain until supplemental O2 added. With 1L O2 pt maintains 88-93% SpO2.   Pt reports doorway measurements should arrive before next session. Discussed various vehicle options for D/C and degree of difficulty/safety of each. Would be best to practice in actual vehicle however plans for this have fallen through this week.   Therapy Documentation Precautions:  Precautions Precautions: Fall Required Braces or Orthoses: Other Brace/Splint Other Brace/Splint: Bledsoe Brace Restrictions Weight Bearing Restrictions: Yes LLE Weight Bearing: Touchdown weight bearing Pain: Pain Assessment Pain Score: 3  Faces Pain Scale: Hurts little more Pain Type: Surgical pain Pain Location: Leg Pain Orientation: Left Pain Descriptors / Indicators: Aching Pain Onset: On-going Pain Intervention(s): RN made aware;Repositioned  See FIM for current functional status  Therapy/Group: Individual Therapy  [name] 08/03/2012, 11:59 AM

## 2012-08-04 ENCOUNTER — Inpatient Hospital Stay (HOSPITAL_COMMUNITY): Payer: Medicaid Other | Admitting: Physical Therapy

## 2012-08-04 ENCOUNTER — Inpatient Hospital Stay (HOSPITAL_COMMUNITY): Payer: Medicaid Other

## 2012-08-04 LAB — CBC WITH DIFFERENTIAL/PLATELET
Eosinophils Absolute: 0.1 10*3/uL (ref 0.0–0.7)
Eosinophils Relative: 1 % (ref 0–5)
Lymphs Abs: 1 10*3/uL (ref 0.7–4.0)
MCH: 27.4 pg (ref 26.0–34.0)
MCV: 94 fL (ref 78.0–100.0)
Monocytes Absolute: 0.4 10*3/uL (ref 0.1–1.0)
Monocytes Relative: 3 % (ref 3–12)
Platelets: 201 10*3/uL (ref 150–400)
RBC: 3.65 MIL/uL — ABNORMAL LOW (ref 4.22–5.81)

## 2012-08-04 NOTE — Progress Notes (Signed)
Patient ID: Chad [name], male   DOB: [birth year], 56 y.o.   MRN: [id number] Subjective/Complaints: No significant pain c/os, on lower dose Morphine A 12 point review of systems has been performed and if not noted above is otherwise negative.   Objective: Vital Signs: Blood pressure 139/82, pulse 82, temperature 98.7 F (37.1 C), temperature source Oral, resp. rate 18, weight 129.2 kg (284 lb 13.4 oz), SpO2 92.00%. No results found. No results found for this basename: WBC, HGB, HCT, PLT,  in the last 72 hours No results found for this basename: NA, K, CL, CO, GLUCOSE, BUN, CREATININE, CALCIUM,  in the last 72 hours CBG (last 3)   Recent Labs  08/02/12 0749 08/03/12 0714 08/04/12 0746  GLUCAP 98 95 91    Wt Readings from Last 3 Encounters:  08/04/12 129.2 kg (284 lb 13.4 oz)  07/13/12 122.471 kg (270 lb)  07/13/12 122.471 kg (270 lb)    Physical Exam:  Constitutional: He is oriented to person, place, and time.  HENT:  Head: Normocephalic.  Eyes: EOM are normal.  Neck: Neck supple. No thyromegaly present.  Cardiovascular:  Cardiac rate controlled  Pulmonary/Chest:  Decreased breath sounds at bases but clear to auscultation otherwise.  Abdominal: Soft. Bowel sounds are normal. He exhibits no distension.  Neurological: He is alert and oriented to person, place, and time.  Patient follows full commands. Motor function within normal limits in UE's and RLE. Sensory function nl  Skin:  Surgical site is dressed. Left leg remains tender.  Musc: left leg with decreased edema as whole although there is an effusion about the knee.Medium sized hematoma infrapatellar without erytema Poor posture with kyphosis and he forward/shoulders IR position Psychiatric: he anxious but otherwise His behavior is normal. Judgment and thought content normal  Neuro:  Eyes without evidence of nystagmus  Tone is normal without evidence of spasticity  Cerebellar exam shows no evidence of ataxia on  finger nose finger or heel to shin testing  No evidence of trunkal ataxia  Skin- no evidence of rash     Assessment/Plan: 1. Functional deficits secondary to distal left femur fx which require 3+ hours per day of interdisciplinary therapy in a comprehensive inpatient rehab setting. Physiatrist is providing close team supervision and 24 hour management of active medical problems listed below. Physiatrist and rehab team continue to assess barriers to discharge/monitor patient progress toward functional and medical goals. Team conference today please see physician documentation under team conference tab, met with team face-to-face to discuss problems,progress, and goals. Formulized individual treatment plan based on medical history, underlying problem and comorbidities.   FIM: FIM - Bathing Bathing Steps Patient Completed: Chest;Right Arm;Left Arm;Abdomen;Front perineal area;Right upper leg;Right lower leg (including foot);Buttocks;Left upper leg Bathing: 4: Min-Patient completes 8-9 53f 10 parts or 75+ percent  FIM - Upper Body Dressing/Undressing Upper body dressing/undressing steps patient completed: Thread/unthread right sleeve of pullover shirt/dresss;Put head through opening of pull over shirt/dress;Thread/unthread left sleeve of pullover shirt/dress;Pull shirt over trunk Upper body dressing/undressing: 5: Set-up assist to: Obtain clothing/put away FIM - Lower Body Dressing/Undressing Lower body dressing/undressing steps patient completed: Thread/unthread right pants leg;Thread/unthread left pants leg;Pull pants up/down;Don/Doff right sock;Don/Doff right shoe Lower body dressing/undressing: 4: Min-Patient completed 75 plus % of tasks  FIM - Toileting Toileting steps completed by patient: Adjust clothing prior to toileting;Adjust clothing after toileting Toileting Assistive Devices: Grab bar or rail for support Toileting: 3: Mod-Patient completed 2 of 3 steps  FIM - Conservator, museum/gallery  Assistive Devices: Bedside commode;Sliding board Toilet Transfers: 5-To toilet/BSC: Supervision (verbal cues/safety issues);5-From toilet/BSC: Supervision (verbal cues/safety issues)  FIM - Banker Devices: Sliding board Bed/Chair Transfer: 5: Supine > Sit: Supervision (verbal cues/safety issues);5: Bed > Chair or W/C: Supervision (verbal cues/safety issues)  FIM - Locomotion: Wheelchair Distance: 150 Locomotion: Wheelchair: 6: Travels 150 ft or more, turns around, maneuvers to table, bed or toilet, negotiates 3% grade: maneuvers on rugs and over door sills independently FIM - Locomotion: Ambulation Ambulation/Gait Assistance: Not tested (comment) Locomotion: Ambulation: 0: Activity did not occur (wheelchair primary means of locomotion)  Comprehension Comprehension Mode: Auditory Comprehension: 7-Follows complex conversation/direction: With no assist  Expression Expression Mode: Verbal Expression: 7-Expresses complex ideas: With no assist  Social Interaction Social Interaction: 7-Interacts appropriately with others - No medications needed.  Problem Solving Problem Solving: 7-Solves complex problems: Recognizes & self-corrects  Memory Memory: 7-Complete Independence: No helper  Medical Problem List and Plan:  1. Left distal femur fracture after fall. Status post ORIF 07/10/2012  2. DVT Prophylaxis/Anticoagulation: Subcutaneous Lovenox. Monitor platelet counts and any signs of bleeding.   -doppler test limited but negative 3. Pain Management: Oxycodone as needed. Monitor with increased mobility   -MS contin will d/c expect increased use of oxy IR.      -pain better   -monitor effusion around the knee--likely will need outpt assessment with ortho 4. Neuropsych: This patient is capable of making decisions on his/her own behalf.  5. Acute blood loss anemia. hgb is trending up 6. Chronic kidney disease and renal  transplant 1996. Baseline creatinine 3.00. Followup per renal services. Continue prednisone/Sirolimus as directed  7. Hypertension/atrial fibrillation. Amiodarone 200 mg daily, coreg 25 mg twice a day, Lasix 80 mg twice a day, hydralazine 25 mg 3 times a day, M.D. or 60 mg daily. Monitor with increased mobility. Patient no chest pain or shortness of breath  8. Nonischemic cardiomyopathy. Continue aspirin therapy.  9. Systolic congestive heart failure. Continue Lasix as advised.  `  -lasix slightly increased to assist with LLE edema   -LLE with coban wrapped with good results  -still hypoxic at times but has poor breathing techniques--sats returned to normal with simple cueing and instruction  -recheck CXR today.  -encouraged better posture, IS   -wean oxygen as able. Likely will need to go home on home oxygen at this point 10. Non-insulin-dependent diabetes mellitus. Hemoglobin A1c 5.7. Blood sugars under good control. Change cbg's to qam only 11. History of gout. Continue allopurinol. Monitor for any gout flareups.  12. Hyperlipidemia. Lipitor  13. Obstructive sleep apnea. CPAP  14. Leukocytosis: decreased (13k), recheck prior to D/C  -urine clean  -IS  -likely steroid effect 15.  Nausea- improved LOS (Days) 20 A FACE TO FACE EVALUATION WAS PERFORMED  Erick Colace 08/04/2012 12:21 PM

## 2012-08-04 NOTE — Progress Notes (Addendum)
Occupational Therapy Session Note  Patient Details  Name: Chad [name] MRN: [id number] Date of Birth: 1955/05/30  Today's Date: 08/04/2012  Session 1 Time: 0700-0800 Time Calculation (min): 60 min  Short Term Goals: Week 3:  OT Short Term Goal 1 (Week 3): Pt will perform simple meal prep at w/c level with supervision OT Short Term Goal 2 (Week 3): Pt will perform simple home mgmt tasks at w/c level with supervison OT Short Term Goal 3 (Week 3): Pt will perform LB dressing with min A utilizing lateral leans to pull up pants OT Short Term Goal 4 (Week 3): Pt will perform toileting with mod A  Skilled Therapeutic Interventions/Progress Updates:    Pt resting in bed upon arrival and sat EOB to complete bathing and dressing tasks.  Pt completed bathing and dressing tasks using AE appropriately before transferring to drop arm BSC to practice clothing management.  Pt provided appropriate directions to therapist for assist as needed.  Pt required assistance for toilet hygiene.  Continued discussion about discharge home and safety at home.  Pt stated he was comfortable with primary care giver at home [facility] [place]). Focus on safety awareness, transfers, toileting, continued education, and discharge planning. Pt continues to require min verbal cues for improved breathing techniques to maintain O2 >90%.  O2 sats at rest: 2L 02-93%; RA=84% O2 sats after 2 min activity (rolling w/c): 2L O2=92%; RA=80%  Therapy Documentation Precautions:  Precautions Precautions: Fall Required Braces or Orthoses: Other Brace/Splint Other Brace/Splint: Bledsoe Brace Restrictions Weight Bearing Restrictions: Yes LLE Weight Bearing: Touchdown weight bearing Pain: Pain Assessment Pain Assessment: 0-10 Pain Score: 7  Pain Type: Surgical pain Pain Location: Leg Pain Orientation: Left;Mid Pain Descriptors / Indicators: Aching Pain Onset: With Activity Patients Stated Pain Goal: 2 Pain Intervention(s): RN made  aware;Repositioned  See FIM for current functional status  Therapy/Group: Individual Therapy  Session 2 Time: 1330-1400 Pt c/o 8/10 pain in LLE; RN awaren; repositoined Individual Therapy  Focus on w/c mobility for simple snack prep and home mgmt tasks at w/c level at supervision level.  Pt requires extra time to complete all tasks and min verbal cues for improved breathing techniques.  Pt exhibits shallow breathing when concentrating on tasks and O2 sats decrease.  Pt does not exhibit any symptoms and does not recognize when his O2 sats are low.  Continued discussion regarding sitting posture to improve O2 sats, boosting in w/c to prevent skin breakdown, and safety at home.  Chad [name] [facility] 08/04/2012, 8:06 AM

## 2012-08-04 NOTE — Progress Notes (Signed)
Social Work Patient ID: Chad [name], male   DOB: Nov 28, 1955, 57 y.o.   MRN: [id number]  Have reviewed team conference information with pt.  He is in process of getting needed measurements for therapists.   Aware need for O2 at home.  Several friends to be here tomorrow to assist with getting home.  Education completed except with car transfers which will take place at d/c.  Evy Lutterman, LCSW

## 2012-08-04 NOTE — Discharge Summary (Signed)
  Discharge summary job [id number]

## 2012-08-04 NOTE — Patient Care Conference (Signed)
Inpatient RehabilitationTeam Conference and Plan of Care Update Date: 08/03/2012   Time: 2:15 PM    Patient Name: Chad [name]      Medical Record Number: [id number]  Date of Birth: 1955/11/27 Sex: Male         Room/Bed: 4010/4010-01 Payor Info: Payor: MEDICAID POTENTIAL / Plan: MEDICAID POTENTIAL / Product Type: *No Product type* /    Admitting Diagnosis: L FEMUR FX  Admit Date/Time:  07/15/2012  3:40 PM Admission Comments: No comment available   Primary Diagnosis:  Femur fracture, left Principal Problem: Femur fracture, left  Patient Active Problem List   Diagnosis Date Noted  . Femur fracture, left 07/16/2012  . Fracture, femur, distal 07/10/2012  . Diarrhea 03/16/2012  . Nausea and vomiting 03/13/2012  . Hx of amiodarone therapy 03/13/2012  . Steroid long-term use 03/13/2012  . Pulmonary hypertension 03/13/2012  . CHF (congestive heart failure) 12/08/2011  . Community acquired pneumonia 12/08/2011  . PAF (paroxysmal atrial fibrillation) 12/08/2011  . History of renal transplant 12/08/2011  . HTN (hypertension) 12/08/2011  . Respiratory failure with hypoxia 12/08/2011  . CKD (chronic kidney disease) 11/18/2011  . Hypertension 11/18/2011  . OSA (obstructive sleep apnea) 11/13/2011  . Chronic systolic CHF (congestive heart failure) 10/22/2011  . Acute on chronic systolic heart failure 10/03/2011  . Cellulitis of leg, left 08/27/2011  . Atrial flutter 08/27/2011  . Ulcer of left lower leg 08/27/2011  . Conjunctivitis, acute, left eye 08/27/2011  . 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 Septic shock 06/10/2011  . Acute renal failure 06/10/2011  . Cardiomyopathy 06/10/2011  . Acute systolic heart failure 06/09/2011  . Renal transplant disorder 06/05/2011  . HCAP (healthcare-associated pneumonia) 06/04/2011  . Thrombocytopenia 06/04/2011  . Acute respiratory failure 06/04/2011  . ARDS (adult respiratory distress syndrome) 06/04/2011    Expected Discharge Date:  Expected Discharge Date: 08/05/12  Team Members Present: Physician leading conference: Dr. Claudette Laws Social Worker Present: Amada Jupiter, LCSW Nurse Present:  Kennon Portela, RN) PT Present: Karolee Stamps, PT OT Present: Mackie Pai, OT;Patricia Mat Carne, OT;Ardis Rowan, COTA     Current Status/Progress Goal Weekly Team Focus  Medical   Pain control and improved, weaning pain medications, wound healing is good except some possible hematoma drainage  Maintain medical stability, wean long acting narcotic pain medications  See above, monitor wound   Bowel/Bladder   Continent of bowel and bladder, Pt using urinal and BSC that staff empties. Pt on stool softeners  continent of bowel and bladder,  encourage toilet rather than BSC   Swallow/Nutrition/ Hydration             ADL's   bathing-min A, LB dsg-min A; toilet transfers-supervision; toileting max A  bathing-min A; UB dsg-supervision; LB dsg-min A; toilet transfers-supervision; toileting-mod A  family education, activity tolerance, safety awareness, toileting   Mobility   supervision/min assist transfers, modified independent wheelchair mobility  supervision, mod I wheelchair mobility  activity tolerance, safety with transfers, family ed   Communication             Safety/Cognition/ Behavioral Observations            Pain   Pain in LLE with scheduled MS cotin 15mg  BID, oxycodone 10mg  q4hrs PRN, robaxin 500mg  q6hrs PRN  pt able to tolerate therapy and manage pain with scheduled and prn meds at or below level of 5  Monitor pain and medicate as needed   Skin   unna boot LLE with hinge brace intact; change Q  sunday and wednesday; 4+ edema in LLE  decreased edema of LLE  monitor edema and change dressing/unna boot as ordered      *See Care Plan and progress notes for long and short-term goals.  Barriers to Discharge: Obesity, chronic deconditioning    Possible Resolutions to Barriers:  See above, incorporate reconditioning along  with left lower extremity range of motion and strengthening    Discharge Planning/Teaching Needs:  home with various caregivers to include Ms. Toney Reil (family friend), son and other friends.  Pt aware of assistance needed and feels this group of "caregivers" will be able to provide the assistance he needs.      Team Discussion:  Still awaiting measurments of doorways from family.  Education completed with Ms. Toney Reil and it went well.  Making good progress with sliding board transfers. Anticipate will need to go home with O2.  Ready for Thursday d/c.  Revisions to Treatment Plan:  None   Continued Need for Acute Rehabilitation Level of Care: The patient requires daily medical management by a physician with specialized training in physical medicine and rehabilitation for the following conditions: Daily direction of a multidisciplinary physical rehabilitation program to ensure safe treatment while eliciting the highest outcome that is of practical value to the patient.: Yes Daily medical management of patient stability for increased activity during participation in an intensive rehabilitation regime.: Yes Daily analysis of laboratory values and/or radiology reports with any subsequent need for medication adjustment of medical intervention for : Post surgical problems;Other  Alyia Lacerte 08/04/2012, 3:03 PM

## 2012-08-04 NOTE — Progress Notes (Signed)
Physical Therapy Note  Patient Details  Name: ALTA SHOBER MRN: [id number] Date of Birth: 27-May-1955 Today's Date: 08/04/2012  1050-1150 (60 minutes) individual Pain: 6/10 Left LE/ premedicated/ (additional pain meds given post therapy) Focus of treatment: Pt in wc upon arrival; wc mobility - 150 feet modified independent in controlled environment; transfer wc >< mat to left or right SBA (pt able to setup up wc for transfer and place sliding board for transfer without assist) ; sit to supine (mat) using sheet to self assist Left LE SBA with difficulty; supine to sit (mat) using sheet to assist Left LE SBA with difficulty; AA leg lifts 2 X 5 in supine with patient using sheet to assist; sit to stand from mat min to SBA; pt able to stand X 1 minute maintaining NWB LE LE using RW. Therapist was shown picture on cell phone of bathroom door measurement (28") ; width of patients wc = 28". Oxygen sats at rest 95% on 2 L Onset, 86% on RA with Oxygen sats with minimal exertion < 90% on RA.  1500-1545 (45 minutes) individual Pain: 6/10 Left LE/premedicated Focus of treatment: wc mobility on unlevel surfaces, ramp; transfer training including managing LT LE supine >< sit Treatment: wc mobility - wc mobility- 150 feet on unlevel brick and paved surfaces SBA ; in/out of elevator with vcs for technique; wc >< mat with sliding board SBA using sheet to assist LT LE on and off mat. ; wc to bed SBA with sliding board.   Mani Celestin,JIM 08/04/2012, 12:02 PM

## 2012-08-04 NOTE — Progress Notes (Signed)
SATURATION QUALIFICATIONS: (This note is used to comply with regulatory documentation for home oxygen)  Patient Saturations on Room Air at Rest = 86%  Patient Saturations on Room Air while transferring in and out of w/c (non-ambulatory)  = 87%  Patient Saturations on 2 Liters of oxygen while transferring = 95%  Please briefly explain why patient needs home oxygen:  Pt very physically deconditioned following a fall and suffering a femur fx.  He is only mobile at a wheelchair level.  Significant h/o CKD with kidney transplant, cardiomyopathy and CHF.   Oreoluwa Aigner, LCSW

## 2012-08-04 NOTE — Plan of Care (Signed)
Problem: RH SKIN INTEGRITY Goal: RH STG ABLE TO PERFORM INCISION/WOUND CARE W/ASSISTANCE STG Able To Perform Incision/Wound Care With Max Assistance.  Outcome: Not Progressing Continue unna boot lft LE with allevyn applied to suture sites x3

## 2012-08-04 NOTE — Progress Notes (Signed)
Occupational Therapy Discharge Summary  Patient Details  Name: Chad [name] MRN: [id number] Date of Birth: [birth year]  Today's Date: 08/04/2012  Patient has met 9 of 9 long term goals due to improved activity tolerance, improved balance, ability to compensate for deficits, improved awareness and improved coordination.  Pt made  Steady progress with bathing, dressing, BSC transfers and toileting during this admission.  Pt min A for bathing and dressing tasks while seated EOB and mod A for toileting.  Pt is supervision for transfers and provides the appropriate directions to care givers for assistance PRN. Patient to discharge at overall min assist-supervision level.  Patient's care partner is independent to provide the necessary physical assistance at discharge.     Recommendation:  No further OT recommended at this time.  Equipment: Drop Arm BSC  Reasons for discharge: treatment goals met and discharge from hospital  Patient/family agrees with progress made and goals achieved: Yes  OT Discharge ADL ADL Equipment Provided: Reacher;Sock aid;Long-handled sponge Eating: Independent Grooming: Independent Upper Body Bathing: Supervision/safety Where Assessed-Upper Body Bathing: Edge of bed Lower Body Bathing: Minimal assistance Where Assessed-Lower Body Bathing: Edge of bed Upper Body Dressing: Supervision/safety Where Assessed-Upper Body Dressing: Edge of bed Lower Body Dressing: Minimal assistance Where Assessed-Lower Body Dressing: Edge of bed Toileting: Moderate assistance Where Assessed-Toileting: Bedside Commode Toilet Transfer: Directs care (Comment);Close supervision Toilet Transfer Method: Scientist, research (life sciences): Extra wide drop arm bedside commode Tub/Shower Transfer Method: Unable to assess  Vision/Perception  Vision - History Patient Visual Report: No change from baseline Vision - Assessment Eye Alignment: Within Functional Limits Vision  Assessment: Vision not tested Perception Perception: Within Functional Limits Praxis Praxis: Intact   Cognition Overall Cognitive Status: Within Functional Limits for tasks assessed Arousal/Alertness: Awake/alert Orientation Level: Oriented X4  Sensation Sensation Light Touch: Impaired by gross assessment Stereognosis: Appears Intact Hot/Cold: Appears Intact Proprioception: Appears Intact  Motor  Motor Motor: Within Functional Limits  Trunk/Postural Assessment  Cervical Assessment Cervical Assessment: Within Functional Limits Thoracic Assessment Thoracic Assessment: Within Functional Limits Lumbar Assessment Lumbar Assessment: Within Functional Limits Postural Control Postural Control: Within Functional Limits   Extremity/Trunk Assessment RUE Assessment RUE Assessment: Within Functional Limits RUE Strength RUE Overall Strength: Within Functional Limits for tasks performed LUE Assessment LUE Assessment: Within Functional Limits LUE Strength LUE Overall Strength: Within Functional Limits for tasks assessed  See FIM for current functional status  [name] 08/04/2012, 3:05 PM

## 2012-08-04 NOTE — Progress Notes (Signed)
Patient ID: Chad [name], male   DOB: 10-14-1955, 57 y.o.   MRN: [id number] Subjective/Complaints: No significant pain c/os, on lower dose Morphine A 12 point review of systems has been performed and if not noted above is otherwise negative.   Objective: Vital Signs: Blood pressure 139/82, pulse 82, temperature 98.7 F (37.1 C), temperature source Oral, resp. rate 18, weight 129.2 kg (284 lb 13.4 oz), SpO2 92.00%. No results found. No results found for this basename: WBC, HGB, HCT, PLT,  in the last 72 hours No results found for this basename: NA, K, CL, CO, GLUCOSE, BUN, CREATININE, CALCIUM,  in the last 72 hours CBG (last 3)   Recent Labs  08/02/12 0749 08/03/12 0714 08/04/12 0746  GLUCAP 98 95 91    Wt Readings from Last 3 Encounters:  08/04/12 129.2 kg (284 lb 13.4 oz)  07/13/12 122.471 kg (270 lb)  07/13/12 122.471 kg (270 lb)    Physical Exam:  Constitutional: He is oriented to person, place, and time.  HENT:  Head: Normocephalic.  Eyes: EOM are normal.  Neck: Neck supple. No thyromegaly present.  Cardiovascular:  Cardiac rate controlled  Pulmonary/Chest:  Decreased breath sounds at bases but clear to auscultation otherwise.  Abdominal: Soft. Bowel sounds are normal. He exhibits no distension.  Neurological: He is alert and oriented to person, place, and time.  Patient follows full commands. Motor function within normal limits in UE's and RLE. Sensory function nl  Skin:  Surgical site is dressed. Left leg remains tender.  Musc: left leg with decreased edema as whole although there is an effusion about the knee.Medium sized hematoma infrapatellar without erytema Poor posture with kyphosis and he forward/shoulders IR position Psychiatric: he anxious but otherwise His behavior is normal. Judgment and thought content normal  Neuro:  Eyes without evidence of nystagmus  Tone is normal without evidence of spasticity  Cerebellar exam shows no evidence of ataxia on  finger nose finger or heel to shin testing  No evidence of trunkal ataxia  Skin- no evidence of rash     Assessment/Plan: 1. Functional deficits secondary to distal left femur fx which require 3+ hours per day of interdisciplinary therapy in a comprehensive inpatient rehab setting. Physiatrist is providing close team supervision and 24 hour management of active medical problems listed below. Physiatrist and rehab team continue to assess barriers to discharge/monitor patient progress toward functional and medical goals. Team conference today please see physician documentation under team conference tab, met with team face-to-face to discuss problems,progress, and goals. Formulized individual treatment plan based on medical history, underlying problem and comorbidities.   FIM: FIM - Bathing Bathing Steps Patient Completed: Chest;Right Arm;Left Arm;Abdomen;Front perineal area;Right upper leg;Right lower leg (including foot);Buttocks;Left upper leg Bathing: 4: Min-Patient completes 8-9 74f 10 parts or 75+ percent  FIM - Upper Body Dressing/Undressing Upper body dressing/undressing steps patient completed: Thread/unthread right sleeve of pullover shirt/dresss;Put head through opening of pull over shirt/dress;Thread/unthread left sleeve of pullover shirt/dress;Pull shirt over trunk Upper body dressing/undressing: 5: Set-up assist to: Obtain clothing/put away FIM - Lower Body Dressing/Undressing Lower body dressing/undressing steps patient completed: Thread/unthread right pants leg;Thread/unthread left pants leg;Pull pants up/down;Don/Doff right sock;Don/Doff right shoe Lower body dressing/undressing: 4: Min-Patient completed 75 plus % of tasks  FIM - Toileting Toileting steps completed by patient: Adjust clothing prior to toileting;Adjust clothing after toileting Toileting Assistive Devices: Grab bar or rail for support Toileting: 3: Mod-Patient completed 2 of 3 steps  FIM - Conservator, museum/gallery  Assistive Devices: Bedside commode;Sliding board Toilet Transfers: 5-To toilet/BSC: Supervision (verbal cues/safety issues);5-From toilet/BSC: Supervision (verbal cues/safety issues)  FIM - Banker Devices: Sliding board Bed/Chair Transfer: 5: Supine > Sit: Supervision (verbal cues/safety issues);5: Bed > Chair or W/C: Supervision (verbal cues/safety issues)  FIM - Locomotion: Wheelchair Distance: 150 Locomotion: Wheelchair: 6: Travels 150 ft or more, turns around, maneuvers to table, bed or toilet, negotiates 3% grade: maneuvers on rugs and over door sills independently FIM - Locomotion: Ambulation Ambulation/Gait Assistance: Not tested (comment) Locomotion: Ambulation: 0: Activity did not occur (wheelchair primary means of locomotion)  Comprehension Comprehension Mode: Auditory Comprehension: 7-Follows complex conversation/direction: With no assist  Expression Expression Mode: Verbal Expression: 7-Expresses complex ideas: With no assist  Social Interaction Social Interaction: 7-Interacts appropriately with others - No medications needed.  Problem Solving Problem Solving: 7-Solves complex problems: Recognizes & self-corrects  Memory Memory: 7-Complete Independence: No helper  Medical Problem List and Plan:  1. Left distal femur fracture after fall. Status post ORIF 07/10/2012  2. DVT Prophylaxis/Anticoagulation: Subcutaneous Lovenox. Monitor platelet counts and any signs of bleeding.   -doppler test limited but negative 3. Pain Management: Oxycodone as needed. Monitor with increased mobility   -MS contin will d/c expect increased use of oxy IR.      -pain better   -monitor effusion around the knee--likely will need outpt assessment with ortho 4. Neuropsych: This patient is capable of making decisions on his/her own behalf.  5. Acute blood loss anemia. hgb is trending up 6. Chronic kidney disease and renal  transplant 1996. Baseline creatinine 3.00. Followup per renal services. Continue prednisone/Sirolimus as directed  7. Hypertension/atrial fibrillation. Amiodarone 200 mg daily, coreg 25 mg twice a day, Lasix 80 mg twice a day, hydralazine 25 mg 3 times a day, M.D. or 60 mg daily. Monitor with increased mobility. Patient no chest pain or shortness of breath  8. Nonischemic cardiomyopathy. Continue aspirin therapy.  9. Systolic congestive heart failure. Continue Lasix as advised.  `  -lasix slightly increased to assist with LLE edema   -LLE with coban wrapped with good results  -still hypoxic at times but has poor breathing techniques--sats returned to normal with simple cueing and instruction  -recheck CXR today.  -encouraged better posture, IS   -wean oxygen as able. Likely will need to go home on home oxygen at this point 10. Non-insulin-dependent diabetes mellitus. Hemoglobin A1c 5.7. Blood sugars under good control. Change cbg's to qam only 11. History of gout. Continue allopurinol. Monitor for any gout flareups.  12. Hyperlipidemia. Lipitor  13. Obstructive sleep apnea. CPAP  14. Leukocytosis: decreased (13k), recheck prior to D/C  -urine clean  -IS  -likely steroid effect 15.  Nausea- improved LOS (Days) 20 A FACE TO FACE EVALUATION WAS PERFORMED  Claudette Laws E 08/04/2012 12:16 PM

## 2012-08-04 NOTE — Progress Notes (Signed)
Orthopedic Tech Progress Note Patient Details:  Chad [name] [birth year] [id number]  Ortho Devices Type of Ortho Device: Roland Rack boot Ortho Device/Splint Location: left leg Ortho Device/Splint Interventions: Application   Cammer, Mickie Bail 08/04/2012, 4:40 PM

## 2012-08-05 ENCOUNTER — Ambulatory Visit (HOSPITAL_COMMUNITY): Payer: Self-pay | Admitting: Physical Therapy

## 2012-08-05 LAB — GLUCOSE, CAPILLARY

## 2012-08-05 MED ORDER — OMEPRAZOLE 20 MG PO CPDR: 20.0000 mg | DELAYED_RELEASE_CAPSULE | Freq: Every day | ORAL | Status: DC

## 2012-08-05 MED ORDER — HYDRALAZINE HCL 50 MG PO TABS: 50.0000 mg | ORAL_TABLET | Freq: Three times a day (TID) | ORAL | Status: DC

## 2012-08-05 MED ORDER — ALLOPURINOL 100 MG PO TABS: 100.0000 mg | ORAL_TABLET | Freq: Two times a day (BID) | ORAL | Status: DC

## 2012-08-05 MED ORDER — ISOSORBIDE MONONITRATE ER 60 MG PO TB24: 60.0000 mg | ORAL_TABLET | Freq: Every day | ORAL | Status: DC

## 2012-08-05 MED ORDER — METHOCARBAMOL 500 MG PO TABS: 500.0000 mg | ORAL_TABLET | Freq: Four times a day (QID) | ORAL | Status: DC | PRN

## 2012-08-05 MED ORDER — FUROSEMIDE 20 MG PO TABS: 100.0000 mg | ORAL_TABLET | Freq: Two times a day (BID) | ORAL | Status: DC

## 2012-08-05 MED ORDER — OXYCODONE HCL 5 MG PO TABS: 5.0000 mg | ORAL_TABLET | ORAL | Status: DC | PRN

## 2012-08-05 MED ORDER — ASPIRIN EC 81 MG PO TBEC: 81.0000 mg | DELAYED_RELEASE_TABLET | Freq: Every day | ORAL | Status: DC

## 2012-08-05 MED ORDER — PRAVASTATIN SODIUM 40 MG PO TABS: 40.0000 mg | ORAL_TABLET | Freq: Every day | ORAL | Status: DC

## 2012-08-05 MED ORDER — CARVEDILOL 25 MG PO TABS: 25.0000 mg | ORAL_TABLET | Freq: Two times a day (BID) | ORAL | Status: DC

## 2012-08-05 MED ORDER — POTASSIUM CHLORIDE CRYS ER 20 MEQ PO TBCR: 20.0000 meq | EXTENDED_RELEASE_TABLET | Freq: Every day | ORAL | Status: DC

## 2012-08-05 MED ORDER — AMIODARONE HCL 200 MG PO TABS: 200.0000 mg | ORAL_TABLET | Freq: Every day | ORAL | Status: DC

## 2012-08-05 MED ORDER — PREDNISONE 10 MG PO TABS: 10.0000 mg | ORAL_TABLET | Freq: Every day | ORAL | Status: DC

## 2012-08-05 NOTE — Discharge Summary (Signed)
NAMEGEARL, BARATTA NO.:  [id number]  MEDICAL RECORD NO.:  [id number]  LOCATION:  4010                         FACILITY:  [facility]  PHYSICIAN:  Ranelle Oyster, M.D.DATE OF BIRTH:  [birth year]  DATE OF ADMISSION:  07/15/2012 DATE OF DISCHARGE:  08/05/2012                              DISCHARGE SUMMARY   DISCHARGE DIAGNOSES: 1. Left distal femur fracture after fall, open reduction and internal     fixation, July 10, 2012. 2. Subcutaneous Lovenox for DVT prophylaxis. 3. Pain management. 4. Acute blood loss anemia. 5. Chronic kidney disease and renal transplant in 1996 with baseline     creatinine 3.00. 6. Hypertension with atrial fibrillation. 7. Nonischemic cardiomyopathy. 8. Systolic congestive heart failure. 9. Non-insulin-dependent diabetes mellitus. 10.History of gout. 11.Hyperlipidemia. 12.Obstructive sleep apnea.  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  The patient independent, prior to admission living alone, presented July 10, 2012, after a fall landing on his left knee.  X-rays and imaging revealed left intra-articular distal femur fracture. Underwent ORIF of left distal femur fracture July 10, 2012, per Dr. [name].  Advised nonweightbearing left lower extremity, knee immobilizer restrictions.  The patient maintained on subcutaneous Lovenox for DVT prophylaxis.  Postoperative anemia 8.3.  He has been transfused.  Renal function stable with creatinine baseline 3.00 on followup per Renal Services.  Physical and occupational therapy ongoing.  The patient was admitted for comprehensive rehab program.  PAST MEDICAL HISTORY:  See discharge diagnoses.  SOCIAL HISTORY:  Lives alone.  FUNCTIONAL HISTORY  PRIOR TO ADMISSION:  Independent and driving.  FUNCTIONAL STATUS UPON ADMISSION TO REHAB SERVICES:  +2 total assist for stand pivot transfers.  PHYSICAL EXAMINATION:  VITAL SIGNS:  Blood pressure 130/72, pulse 85, temperature 98.9, respirations 18. GENERAL:  This was an alert male, oriented x3. HEENT:  Pupils reactive to light. LUNGS:  Clear to auscultation. CARDIAC:  Rate controlled. ABDOMEN:  Soft, nontender.  Good bowel sounds.  Surgical site dressed with no drainage.  REHABILITATION HOSPITAL COURSE:  The patient was admitted to [facility] with therapies initiated on a 3-hour daily basis consisting of physical therapy, occupational therapy, and rehabilitation nursing.  The following issues were addressed during the patient's rehabilitation stay.  Pertaining to Mr. Bawa, left distal femur fracture, he had undergone ORIF July 10, 2012.  Surgical site healing nicely.  He did have some dependent edema.  He continued on subcutaneous Lovenox for DVT prophylaxis with venous Doppler studies negative.  He was touchdown weightbearing.  He would follow up with Orthopedic Services.  Pain management ongoing with the use of MS Contin that was slowly tapered off.  Robaxin for muscle spasms and oxycodone for breakthrough pain with good results.  The patient did have a history of chronic kidney disease and renal transplant.  Baseline creatinine 3.00 and followed up per Renal Services.  His blood pressures and heart rate remained controlled with history of atrial fibrillation.  Patient continued on amiodarone 200 mg daily, monitoring for  any signs of fluid overload with Lasix adjusted to 100 mg twice daily and close monitoring of renal function. Patient did have some drops in oxygen saturation with activity of which he was asymptomatic and home oxygen was ordered to be used as needed. He did have a history of diabetes mellitus, hemoglobin 5.7, monitored closely while on low-dose  prednisone for history of renal transplant.  The patient received weekly collaborative interdisciplinary team conferences to discuss estimated length of stay, family teaching, and any barriers to discharge.  He required moderate assist lower body bathing, toilet transfers, steady assist supervision overall for toileting using a sliding board for transfers, supervision minimal assist, limited ability to stand from an elevated surface.  He did continue to participate, strength and endurance continued to improve.  Plan, will be discharged to home with family assistance.  DISCHARGE MEDICATIONS: 1. Allopurinol 100 mg p.o. b.i.d. 2. Amiodarone 200 mg p.o. daily. 3. Aspirin 81 mg p.o. daily. 4. Lipitor 20 mg p.o. daily. 5. Coreg 25 mg p.o. b.i.d. 6. Aranesp as advised per [facility]al Services. 7. Colace 100 mg p.o. b.i.d. 8. Lasix 100 mg p.o. b.i.d. 9. Hydralazine 50 mg p.o. t.i.d. 10.Imdur 60 mg p.o. daily. 11.Robaxin 500 mg p.o. every 6 hours as needed for muscle spasms. 12.MS Contin 15 mg every 12 hours with taper as advised. 13.OxyContin immediate release 5-10 mg every 3 hours as needed     breakthrough pain. 14.Potassium chloride 20 mEq p.o. daily. 15.Prednisone 10 mg p.o. daily. 16.Sirolimus 1.2 mg p.o. daily.  DIET:  Diabetic diet.  SPECIAL INSTRUCTIONS:  The patient would follow up Dr. Faith Rogue, at the [facility] as needed; Dr. Toni Arthurs, Orthopedic [facility] 2 weeks; Dr. Elvis Coil, [facility] as needed, call for appointment.  Special instructions, touchdown weightbearing left lower extremity.  Home health therapies arranged. Home oxygen 2 L as needed for shortness of breath     Mariam Dollar, P.A.   ______________________________ Ranelle Oyster, M.D.    DA/MEDQ  D:  08/04/2012  T:  08/04/2012  Job:  [id number]  cc:   Ranelle Oyster, M.D. Toni Arthurs, MD Garnetta Buddy, M.D.

## 2012-08-05 NOTE — Progress Notes (Signed)
Patient ID: BROWNIE NEHME, male   DOB: Feb 15, 1955, 57 y.o.   MRN: [id number] Subjective/Complaints: No significant pain c/os, off Morphine A 12 point review of systems has been performed and if not noted above is otherwise negative.   Objective: Vital Signs: Blood pressure 136/79, pulse 78, temperature 98.4 F (36.9 C), temperature source Oral, resp. rate 17, weight 129.7 kg (285 lb 15 oz), SpO2 92.00%. No results found.  Recent Labs  08/04/12 1750  WBC 13.3*  HGB 10.0*  HCT 34.3*  PLT 201    Recent Labs  08/05/12 0550  CREATININE 3.37*   CBG (last 3)   Recent Labs  08/03/12 0714 08/04/12 0746 08/05/12 0721  GLUCAP 95 91 101*    Wt Readings from Last 3 Encounters:  08/05/12 129.7 kg (285 lb 15 oz)  07/13/12 122.471 kg (270 lb)  07/13/12 122.471 kg (270 lb)    Physical Exam:  Constitutional: He is oriented to person, place, and time.  HENT:  Head: Normocephalic.  Eyes: EOM are normal.  Neck: Neck supple. No thyromegaly present.  Cardiovascular:  Cardiac rate controlled  Pulmonary/Chest:  Decreased breath sounds at bases but clear to auscultation otherwise.  Abdominal: Soft. Bowel sounds are normal. He exhibits no distension.  Neurological: He is alert and oriented to person, place, and time.  Patient follows full commands. Motor function within normal limits in UE's and RLE. Sensory function nl  Skin:  Surgical site is dressed. Left leg remains tender.  Musc: left leg with decreased edema as whole although there is an effusion about the knee.Medium sized hematoma infrapatellar without erytema Poor posture with kyphosis and he forward/shoulders IR position Psychiatric: he anxious but otherwise His behavior is normal. Judgment and thought content normal  Neuro:  Eyes without evidence of nystagmus  Tone is normal without evidence of spasticity  Cerebellar exam shows no evidence of ataxia on finger nose finger or heel to shin testing  No evidence of trunkal  ataxia  Skin- no evidence of rash     Assessment/Plan: 1. Functional deficits secondary to distal left femur fx which require 3+ hours per day of interdisciplinary therapy in a comprehensive inpatient rehab setting. Physiatrist is providing close team supervision and 24 hour management of active medical problems listed below. Physiatrist and rehab team continue to assess barriers to discharge/monitor patient progress toward functional and medical goals. Stable for D/C today F/uOrtho in 1-2 weeks F/u PCP 3 weeks See D/C summary See D/C instructions  FIM: FIM - Bathing Bathing Steps Patient Completed: Chest;Right Arm;Left Arm;Abdomen;Front perineal area;Right upper leg;Right lower leg (including foot);Buttocks;Left upper leg Bathing: 4: Min-Patient completes 8-9 25f 10 parts or 75+ percent  FIM - Upper Body Dressing/Undressing Upper body dressing/undressing steps patient completed: Thread/unthread right sleeve of pullover shirt/dresss;Put head through opening of pull over shirt/dress;Thread/unthread left sleeve of pullover shirt/dress;Pull shirt over trunk Upper body dressing/undressing: 5: Set-up assist to: Obtain clothing/put away FIM - Lower Body Dressing/Undressing Lower body dressing/undressing steps patient completed: Thread/unthread right pants leg;Thread/unthread left pants leg;Pull pants up/down;Don/Doff right sock;Don/Doff right shoe Lower body dressing/undressing: 4: Min-Patient completed 75 plus % of tasks  FIM - Toileting Toileting steps completed by patient: Adjust clothing prior to toileting;Adjust clothing after toileting Toileting Assistive Devices: Grab bar or rail for support Toileting: 3: Mod-Patient completed 2 of 3 steps  FIM - Diplomatic Services operational officer Devices: Human resources officer Transfers: 5-To toilet/BSC: Supervision (verbal cues/safety issues);5-From toilet/BSC: Supervision (verbal cues/safety issues)  FIM - Bed/Chair  Water engineer Transfer: 5: Supine > Sit: Supervision (verbal cues/safety issues)  FIM - Locomotion: Wheelchair Distance: 150 Locomotion: Wheelchair: 6: Travels 150 ft or more, turns around, maneuvers to table, bed or toilet, negotiates 3% grade: maneuvers on rugs and over door sills independently FIM - Locomotion: Ambulation Ambulation/Gait Assistance: Not tested (comment) Locomotion: Ambulation: 0: Activity did not occur  Comprehension Comprehension Mode: Auditory Comprehension: 7-Follows complex conversation/direction: With no assist  Expression Expression Mode: Verbal Expression: 7-Expresses complex ideas: With no assist  Social Interaction Social Interaction: 7-Interacts appropriately with others - No medications needed.  Problem Solving Problem Solving: 7-Solves complex problems: Recognizes & self-corrects  Memory Memory: 7-Complete Independence: No helper  Medical Problem List and Plan:  1. Left distal femur fracture after fall. Status post ORIF 07/10/2012  2. DVT Prophylaxis/Anticoagulation: Subcutaneous Lovenox. Monitor platelet counts and any signs of bleeding.   -doppler test limited but negative 3. Pain Management: Oxycodone as needed. Monitor with increased mobility   -MS contin will d/c expect increased use of oxy IR.      -pain better   -monitor effusion around the knee--likely will need outpt assessment with ortho 4. Neuropsych: This patient is capable of making decisions on his/her own behalf.  5. Acute blood loss anemia. hgb is trending up 6. Chronic kidney disease and renal transplant 1996. Baseline creatinine 3.00. Followup per renal services. Continue prednisone/Sirolimus as directed  7. Hypertension/atrial fibrillation. Amiodarone 200 mg daily, coreg 25 mg twice a day, Lasix 80 mg twice a day, hydralazine 25 mg 3 times a day, M.D. or 60 mg daily. Monitor with increased mobility. Patient no chest pain  or shortness of breath  8. Nonischemic cardiomyopathy. Continue aspirin therapy.  9. Systolic congestive heart failure. Continue Lasix as advised.  `  -lasix slightly increased to assist with LLE edema   -LLE with coban wrapped with good results  -still hypoxic at times but has poor breathing techniques--sats returned to normal with simple cueing and instruction  -recheck CXR today.  -encouraged better posture, IS   -wean oxygen as able.  will need to go home on home oxygen at this point 10. Non-insulin-dependent diabetes mellitus. Hemoglobin A1c 5.7. Blood sugars under good control. Change cbg's to qam only 11. History of gout. Continue allopurinol. Monitor for any gout flareups.  12. Hyperlipidemia. Lipitor  13. Obstructive sleep apnea. CPAP  14. Leukocytosis: decreased (13k), recheck stable   -likely steroid effect  LOS (Days) 21 A FACE TO FACE EVALUATION WAS PERFORMED  KIRSTEINS,ANDREW E 08/05/2012 10:05 AM

## 2012-08-05 NOTE — Progress Notes (Signed)
Physical Therapy Discharge Summary  Patient Details  Name: Chad [name] MRN: [id number] Date of Birth: November 04, 1955  Today's Date: 08/05/2012 Time: 1200-1305 Time Calculation (min): 65 min  Family not present during scheduled time, session rearranged Today focused on wheelchair adjustment for home (new leg rests were not functioning correctly, new ones received and adjusted for pt). Sliding board transfer from rehab wheelchair to pt's new wheelchair with father providing supervision, cues for brakes. Rest of session spent on sliding board transfer to SUV vehicle. Transfer was difficult for pt due to how board had to be positioned however pt able to move across board without physical assist. Pt transferred to back seat and scooted across seat with Lt. LE straight. PT educated pt's father and two friends on providing supervision and stabilizing board during transfer. All verbalized understanding via teach back. Pt also able to teach back education from transfer.   Patient has met 6 of 6 long term goals due to improved activity tolerance, improved balance, increased strength, decreased pain and ability to compensate for deficits.  Patient to discharge at a wheelchair level Supervision. Pt has made good gains while here progressing from +2 assist/max assist to supervision level.   Patient's care partner is independent to provide the necessary physical assistance at discharge however limited support has been provided by home providers during stay on rehab, there has been a long delay in getting measurements for the home and difficulty getting caregivers in for family education (particularly for the car transfer). Pt continues to report sufficient support at home for 24 hour care.   Reasons goals not met: NA  Recommendation:  Patient will benefit from ongoing skilled PT services in home health setting to continue to advance safe functional mobility, address ongoing impairments in functional strength,  endurance, mobility, elevated need for assistance, pre-gait, bed mobility, pain management, and minimize fall risk.  Equipment: slidign board, wheelchair  Reasons for discharge: treatment goals met and discharge from hospital  Patient/family agrees with progress made and goals achieved: Yes  PT Discharge Precautions/Restrictions Precautions Precautions: None Restrictions Weight Bearing Restrictions: Yes LLE Weight Bearing: Touchdown weight bearing Pain  4/10 Lt. LE pain from surgery. Repositioned.   Cognition Overall Cognitive Status: Within Functional Limits for tasks assessed Arousal/Alertness: Awake/alert Orientation Level: Oriented X4 Sensation Sensation Light Touch: Impaired by gross assessment (bil. LEs Lt>Rt) Stereognosis: Appears Intact Hot/Cold: Appears Intact Proprioception: Appears Intact Coordination Gross Motor Movements are Fluid and Coordinated: Yes Fine Motor Movements are Fluid and Coordinated: Yes Motor  Motor Motor: Within Functional Limits  Mobility Bed Mobility Supine to Sit: 5: Supervision Supine to Sit Details: Verbal cues for sequencing;Verbal cues for technique Sit to Supine: 5: Supervision Transfers Sit to Stand: 4: Min assist Sit to Stand Details (indicate cue type and reason): Pt able to stand with min assist from elevated surface with use of RW once in standing. Stand to Sit: 4: Min assist Stand to Sit Details: Decreased control of descent Lateral/Scoot Transfers: With slide board;5: Supervision Lateral/Scoot Transfer Details (indicate cue type and reason): Pt able to place board and direct set up appropriately. Sometimes pt needs assist to hold wheelchair or board depending on height of transfer and surface transfer to/from.  Locomotion  Ambulation Ambulation: No (pt unable to perform safely) Wheelchair Mobility Wheelchair Mobility: Yes Wheelchair Assistance: 6: Modified independent (Device/Increase time) Occupational hygienist: Both  upper extremities Wheelchair Parts Management: Independent Distance: >150  Trunk/Postural Assessment  Cervical Assessment Cervical Assessment: Within Functional Limits Thoracic Assessment Thoracic Assessment:  Within Functional Limits Lumbar Assessment Lumbar Assessment: Within Functional Limits Postural Control Postural Control: Within Functional Limits  Balance Dynamic Sitting Balance Dynamic Sitting - Balance Support: No upper extremity supported Dynamic Sitting - Level of Assistance: 7: Independent Static Standing Balance Static Standing - Balance Support: Bilateral upper extremity supported Static Standing - Level of Assistance: 4: Min assist Extremity Assessment      RLE Assessment RLE Assessment: Exceptions to [facility] RLE Strength Right Hip Flexion: 3-/5 Right Knee Flexion: 3-/5 Right Knee Extension: 3/5 Right Ankle Dorsiflexion: 3+/5 Right Ankle Plantar Flexion: 3+/5 LLE Assessment LLE Assessment: Exceptions to WFL LLE AROM (degrees) LLE Overall AROM Comments: decreased dorsiflexion due to edema, other ROM not assessed due to Bledsoe Brace LLE Strength LLE Overall Strength Comments: Deficits but no MMT due to precautions  See FIM for current functional status  Chad [name] 08/05/2012, 4:24 PM

## 2012-08-05 NOTE — Progress Notes (Signed)
1250 Pt. Discharged to home with family.  Discharge instructions reviewed by Sherol Dade, PA.  Pt. Has no further questions at this time.  All personal belongings in tow.  Escorted to vehicle by NT.

## 2012-08-06 NOTE — Progress Notes (Signed)
Social Work  Discharge Note  The overall goal for the admission was met for:   Discharge location: Yes - home with son, private caregiver and friends to provide 24/7 assistance  Length of Stay: Yes - 21 days  Discharge activity level: Yes - mod i to minimal assist w/c level  Home/community participation: Yes  Services provided included: MD, RD, PT, OT, RN, CM, TR, Pharmacy and SW  Financial Services: Other: Self Pay  Follow-up services arranged: Home Health: RN, PT via [facility], DME: 20x18 Breezy w/c with ELR, cushion, drop arm commode, 30" transfer board, oxygen via [facility] and Patient/Family has no preference for HH/DME agencies  Comments (or additional information):  Patient/Family verbalized understanding of follow-up arrangements: Yes  Individual responsible for coordination of the follow-up plan: patient  Confirmed correct DME delivered: Taesha Goodell 08/06/2012    Sheriann Newmann

## 2012-08-06 NOTE — Progress Notes (Signed)
Recreational Therapy Discharge Summary Patient Details  Name: Chad [name] MRN: [id number] Date of Birth: March 17, 1955 Today's Date: 08/06/2012  Long term goals set: 1  Long term goals met: 1  Comments on progress toward goals: Pt has made good progress toward goal meeting Mod I level for w/c level tasks.  Pt does require use of AE & extra time to complete tasks, but is able to do so without assistance or cuing. Pt discharged home 08/06/11 with the needed assistance required. Reasons for discharge: discharge from hospital  Patient/family agrees with progress made and goals achieved: Yes  Chad [name] 08/06/2012, 8:13 AM

## 2012-08-19 ENCOUNTER — Inpatient Hospital Stay (HOSPITAL_COMMUNITY)
Admission: EM | Admit: 2012-08-19 | Discharge: 2012-08-30 | DRG: 856 | Disposition: A | Discharge status: Home / Self Care | Payer: Medicaid Other | Attending: Orthopedic Surgery | Admitting: Orthopedic Surgery

## 2012-08-19 ENCOUNTER — Encounter (HOSPITAL_COMMUNITY): Payer: Self-pay | Admitting: Family Medicine

## 2012-08-19 DIAGNOSIS — N189 Chronic kidney disease, unspecified: Secondary | ICD-10-CM

## 2012-08-19 DIAGNOSIS — L03116 Cellulitis of left lower limb: Secondary | ICD-10-CM

## 2012-08-19 DIAGNOSIS — A498 Other bacterial infections of unspecified site: Secondary | ICD-10-CM

## 2012-08-19 DIAGNOSIS — T8140XA Infection following a procedure, unspecified, initial encounter: Principal | ICD-10-CM | POA: Diagnosis present

## 2012-08-19 DIAGNOSIS — T847XXD Infection and inflammatory reaction due to other internal orthopedic prosthetic devices, implants and grafts, subsequent encounter: Secondary | ICD-10-CM

## 2012-08-19 DIAGNOSIS — I10 Essential (primary) hypertension: Secondary | ICD-10-CM

## 2012-08-19 DIAGNOSIS — Z96649 Presence of unspecified artificial hip joint: Secondary | ICD-10-CM

## 2012-08-19 DIAGNOSIS — I4891 Unspecified atrial fibrillation: Secondary | ICD-10-CM | POA: Diagnosis present

## 2012-08-19 DIAGNOSIS — I5022 Chronic systolic (congestive) heart failure: Secondary | ICD-10-CM

## 2012-08-19 DIAGNOSIS — I5042 Chronic combined systolic (congestive) and diastolic (congestive) heart failure: Secondary | ICD-10-CM | POA: Diagnosis present

## 2012-08-19 DIAGNOSIS — I480 Paroxysmal atrial fibrillation: Secondary | ICD-10-CM | POA: Diagnosis present

## 2012-08-19 DIAGNOSIS — T8610 Unspecified complication of kidney transplant: Secondary | ICD-10-CM

## 2012-08-19 DIAGNOSIS — N186 End stage renal disease: Secondary | ICD-10-CM | POA: Diagnosis present

## 2012-08-19 DIAGNOSIS — Y9210 Unspecified residential institution as the place of occurrence of the external cause: Secondary | ICD-10-CM | POA: Diagnosis present

## 2012-08-19 DIAGNOSIS — N184 Chronic kidney disease, stage 4 (severe): Secondary | ICD-10-CM

## 2012-08-19 DIAGNOSIS — T8149XA Infection following a procedure, other surgical site, initial encounter: Secondary | ICD-10-CM

## 2012-08-19 DIAGNOSIS — I120 Hypertensive chronic kidney disease with stage 5 chronic kidney disease or end stage renal disease: Secondary | ICD-10-CM | POA: Diagnosis present

## 2012-08-19 DIAGNOSIS — IMO0002 Reserved for concepts with insufficient information to code with codable children: Secondary | ICD-10-CM | POA: Diagnosis present

## 2012-08-19 DIAGNOSIS — M7989 Other specified soft tissue disorders: Secondary | ICD-10-CM

## 2012-08-19 DIAGNOSIS — D899 Disorder involving the immune mechanism, unspecified: Secondary | ICD-10-CM | POA: Diagnosis present

## 2012-08-19 DIAGNOSIS — M79609 Pain in unspecified limb: Secondary | ICD-10-CM

## 2012-08-19 DIAGNOSIS — N185 Chronic kidney disease, stage 5: Secondary | ICD-10-CM

## 2012-08-19 DIAGNOSIS — Z940 Kidney transplant status: Secondary | ICD-10-CM

## 2012-08-19 DIAGNOSIS — B952 Enterococcus as the cause of diseases classified elsewhere: Secondary | ICD-10-CM | POA: Diagnosis present

## 2012-08-19 DIAGNOSIS — I429 Cardiomyopathy, unspecified: Secondary | ICD-10-CM

## 2012-08-19 DIAGNOSIS — L02419 Cutaneous abscess of limb, unspecified: Secondary | ICD-10-CM

## 2012-08-19 DIAGNOSIS — I890 Lymphedema, not elsewhere classified: Secondary | ICD-10-CM | POA: Diagnosis present

## 2012-08-19 DIAGNOSIS — M869 Osteomyelitis, unspecified: Secondary | ICD-10-CM

## 2012-08-19 DIAGNOSIS — L03119 Cellulitis of unspecified part of limb: Secondary | ICD-10-CM | POA: Diagnosis present

## 2012-08-19 DIAGNOSIS — R197 Diarrhea, unspecified: Secondary | ICD-10-CM | POA: Diagnosis present

## 2012-08-19 DIAGNOSIS — B1920 Unspecified viral hepatitis C without hepatic coma: Secondary | ICD-10-CM | POA: Diagnosis present

## 2012-08-19 DIAGNOSIS — R6521 Severe sepsis with septic shock: Secondary | ICD-10-CM

## 2012-08-19 DIAGNOSIS — I509 Heart failure, unspecified: Secondary | ICD-10-CM | POA: Diagnosis present

## 2012-08-19 DIAGNOSIS — I428 Other cardiomyopathies: Secondary | ICD-10-CM

## 2012-08-19 DIAGNOSIS — IMO0001 Reserved for inherently not codable concepts without codable children: Secondary | ICD-10-CM

## 2012-08-19 DIAGNOSIS — T8130XA Disruption of wound, unspecified, initial encounter: Secondary | ICD-10-CM | POA: Diagnosis present

## 2012-08-19 DIAGNOSIS — E785 Hyperlipidemia, unspecified: Secondary | ICD-10-CM | POA: Diagnosis present

## 2012-08-19 DIAGNOSIS — B961 Klebsiella pneumoniae [K. pneumoniae] as the cause of diseases classified elsewhere: Secondary | ICD-10-CM | POA: Diagnosis present

## 2012-08-19 DIAGNOSIS — A419 Sepsis, unspecified organism: Secondary | ICD-10-CM

## 2012-08-19 DIAGNOSIS — Y838 Other surgical procedures as the cause of abnormal reaction of the patient, or of later complication, without mention of misadventure at the time of the procedure: Secondary | ICD-10-CM | POA: Diagnosis present

## 2012-08-19 DIAGNOSIS — Z7982 Long term (current) use of aspirin: Secondary | ICD-10-CM

## 2012-08-19 DIAGNOSIS — T847XXS Infection and inflammatory reaction due to other internal orthopedic prosthetic devices, implants and grafts, sequela: Secondary | ICD-10-CM

## 2012-08-19 LAB — BASIC METABOLIC PANEL
BUN: 54 mg/dL — ABNORMAL HIGH (ref 6–23)
CO2: 30 mEq/L (ref 19–32)
Calcium: 9.5 mg/dL (ref 8.4–10.5)
Calcium: 9.2 mg/dL (ref 8.4–10.5)
Chloride: 93 mEq/L — ABNORMAL LOW (ref 96–112)
Creatinine, Ser: 3.17 mg/dL — ABNORMAL HIGH (ref 0.50–1.35)
GFR calc Af Amer: 23 mL/min — ABNORMAL LOW (ref >90)
GFR calc Af Amer: 24 mL/min — ABNORMAL LOW (ref >90)
GFR calc non Af Amer: 21 mL/min — ABNORMAL LOW (ref >90)
Potassium: 3.7 mEq/L (ref 3.5–5.1)
Sodium: 134 mEq/L — ABNORMAL LOW (ref 135–145)

## 2012-08-19 LAB — CBC WITH DIFFERENTIAL/PLATELET
Basophils Relative: 0 % (ref 0–1)
HCT: 41.9 % (ref 39.0–52.0)
Hemoglobin: 12.9 g/dL — ABNORMAL LOW (ref 13.0–17.0)
Lymphocytes Relative: 10 % — ABNORMAL LOW (ref 12–46)
Monocytes Relative: 5 % (ref 3–12)
Neutro Abs: 9.5 10*3/uL — ABNORMAL HIGH (ref 1.7–7.7)
RBC: 4.75 MIL/uL (ref 4.22–5.81)
WBC: 11.4 10*3/uL — ABNORMAL HIGH (ref 4.0–10.5)

## 2012-08-19 MED ORDER — SODIUM CHLORIDE 0.9 % IV SOLN
2500.0000 mg | INTRAVENOUS | Status: AC
  Administered 2012-08-19: 2500 mg via INTRAVENOUS
  Filled 2012-08-19: qty 2500

## 2012-08-19 MED ORDER — ACETAMINOPHEN 650 MG RE SUPP: 650.0000 mg | Freq: Four times a day (QID) | RECTAL | Status: DC | PRN

## 2012-08-19 MED ORDER — PREDNISONE 10 MG PO TABS
10.0000 mg | ORAL_TABLET | Freq: Every day | ORAL | Status: DC
  Administered 2012-08-20 – 2012-08-30 (×11): 10 mg via ORAL
  Filled 2012-08-19 (×12): qty 1

## 2012-08-19 MED ORDER — ONDANSETRON HCL 4 MG PO TABS
4.0000 mg | ORAL_TABLET | Freq: Four times a day (QID) | ORAL | Status: DC | PRN
  Administered 2012-08-20: 4 mg via ORAL
  Filled 2012-08-19: qty 1

## 2012-08-19 MED ORDER — OXYCODONE HCL 5 MG PO TABS
5.0000 mg | ORAL_TABLET | ORAL | Status: DC | PRN
  Administered 2012-08-20 – 2012-08-29 (×18): 10 mg via ORAL
  Filled 2012-08-19 (×16): qty 2

## 2012-08-19 MED ORDER — FUROSEMIDE 80 MG PO TABS
100.0000 mg | ORAL_TABLET | Freq: Two times a day (BID) | ORAL | Status: DC
  Administered 2012-08-20 – 2012-08-25 (×12): 100 mg via ORAL
  Filled 2012-08-19 (×15): qty 1

## 2012-08-19 MED ORDER — SIMVASTATIN 20 MG PO TABS
20.0000 mg | ORAL_TABLET | Freq: Every day | ORAL | Status: DC
  Administered 2012-08-20 – 2012-08-29 (×10): 20 mg via ORAL
  Filled 2012-08-19 (×12): qty 1

## 2012-08-19 MED ORDER — POTASSIUM CHLORIDE CRYS ER 20 MEQ PO TBCR
20.0000 meq | EXTENDED_RELEASE_TABLET | Freq: Every day | ORAL | Status: DC
  Administered 2012-08-20 – 2012-08-30 (×11): 20 meq via ORAL
  Filled 2012-08-19 (×13): qty 1

## 2012-08-19 MED ORDER — PIPERACILLIN-TAZOBACTAM 3.375 G IVPB: 3.3750 g | Freq: Once | INTRAVENOUS | Status: DC

## 2012-08-19 MED ORDER — METHOCARBAMOL 500 MG PO TABS: 500.0000 mg | ORAL_TABLET | Freq: Four times a day (QID) | ORAL | Status: DC | PRN

## 2012-08-19 MED ORDER — CIPROFLOXACIN IN D5W 400 MG/200ML IV SOLN
400.0000 mg | INTRAVENOUS | Status: DC
  Filled 2012-08-19: qty 200

## 2012-08-19 MED ORDER — ASPIRIN EC 81 MG PO TBEC
81.0000 mg | DELAYED_RELEASE_TABLET | Freq: Every day | ORAL | Status: DC
  Administered 2012-08-20 – 2012-08-30 (×10): 81 mg via ORAL
  Filled 2012-08-19 (×12): qty 1

## 2012-08-19 MED ORDER — HEPARIN SODIUM (PORCINE) 5000 UNIT/ML IJ SOLN
5000.0000 [IU] | Freq: Three times a day (TID) | INTRAMUSCULAR | Status: DC
  Administered 2012-08-20 – 2012-08-23 (×12): 5000 [IU] via SUBCUTANEOUS
  Filled 2012-08-19 (×14): qty 1

## 2012-08-19 MED ORDER — AMIODARONE HCL 200 MG PO TABS
200.0000 mg | ORAL_TABLET | Freq: Every day | ORAL | Status: DC
  Administered 2012-08-20 – 2012-08-30 (×11): 200 mg via ORAL
  Filled 2012-08-19 (×11): qty 1

## 2012-08-19 MED ORDER — SODIUM CHLORIDE 0.9 % IJ SOLN
3.0000 mL | Freq: Two times a day (BID) | INTRAMUSCULAR | Status: DC
  Administered 2012-08-20 – 2012-08-29 (×13): 3 mL via INTRAVENOUS

## 2012-08-19 MED ORDER — SIROLIMUS 1 MG/ML PO SOLN
1.2000 mg | Freq: Every day | ORAL | Status: DC
  Administered 2012-08-20 – 2012-08-30 (×11): 1.2 mg via ORAL
  Filled 2012-08-19 (×12): qty 1.2

## 2012-08-19 MED ORDER — VANCOMYCIN HCL 10 G IV SOLR
1500.0000 mg | INTRAVENOUS | Status: DC
  Administered 2012-08-21 – 2012-08-23 (×2): 1500 mg via INTRAVENOUS
  Filled 2012-08-19 (×3): qty 1500

## 2012-08-19 MED ORDER — ACETAMINOPHEN 325 MG PO TABS: 650.0000 mg | ORAL_TABLET | Freq: Four times a day (QID) | ORAL | Status: DC | PRN

## 2012-08-19 MED ORDER — ISOSORBIDE MONONITRATE ER 60 MG PO TB24
60.0000 mg | ORAL_TABLET | Freq: Every day | ORAL | Status: DC
  Administered 2012-08-20 – 2012-08-30 (×11): 60 mg via ORAL
  Filled 2012-08-19 (×11): qty 1

## 2012-08-19 MED ORDER — ONDANSETRON HCL 4 MG/2ML IJ SOLN
4.0000 mg | Freq: Four times a day (QID) | INTRAMUSCULAR | Status: DC | PRN
  Administered 2012-08-21 – 2012-08-29 (×4): 4 mg via INTRAVENOUS
  Filled 2012-08-19 (×4): qty 2

## 2012-08-19 MED ORDER — PANTOPRAZOLE SODIUM 40 MG PO TBEC
40.0000 mg | DELAYED_RELEASE_TABLET | Freq: Every day | ORAL | Status: DC
  Administered 2012-08-20 – 2012-08-30 (×11): 40 mg via ORAL
  Filled 2012-08-19 (×8): qty 1

## 2012-08-19 MED ORDER — CARVEDILOL 25 MG PO TABS
25.0000 mg | ORAL_TABLET | Freq: Two times a day (BID) | ORAL | Status: DC
  Administered 2012-08-20 – 2012-08-30 (×21): 25 mg via ORAL
  Filled 2012-08-19 (×25): qty 1

## 2012-08-19 MED ORDER — METHOCARBAMOL 500 MG PO TABS
500.0000 mg | ORAL_TABLET | Freq: Four times a day (QID) | ORAL | Status: DC | PRN
  Administered 2012-08-20 – 2012-08-24 (×3): 500 mg via ORAL
  Filled 2012-08-19 (×3): qty 1

## 2012-08-19 MED ORDER — MORPHINE SULFATE 4 MG/ML IJ SOLN
6.0000 mg | Freq: Once | INTRAMUSCULAR | Status: AC
  Administered 2012-08-19: 6 mg via INTRAVENOUS
  Filled 2012-08-19: qty 2

## 2012-08-19 MED ORDER — ALLOPURINOL 100 MG PO TABS
100.0000 mg | ORAL_TABLET | Freq: Two times a day (BID) | ORAL | Status: DC
  Administered 2012-08-20 – 2012-08-30 (×21): 100 mg via ORAL
  Filled 2012-08-19 (×28): qty 1

## 2012-08-19 MED ORDER — HYDRALAZINE HCL 50 MG PO TABS
50.0000 mg | ORAL_TABLET | Freq: Three times a day (TID) | ORAL | Status: DC
  Administered 2012-08-20 – 2012-08-30 (×30): 50 mg via ORAL
  Filled 2012-08-19 (×40): qty 1

## 2012-08-19 MED ORDER — PIPERACILLIN-TAZOBACTAM 3.375 G IVPB 30 MIN
3.3750 g | Freq: Once | INTRAVENOUS | Status: AC
  Administered 2012-08-19: 3.375 g via INTRAVENOUS
  Filled 2012-08-19: qty 50

## 2012-08-19 NOTE — H&P (Signed)
[name] History and Physical  NASZIR COTT WUJ:[id number] DOB: [birth year] DOA: 08/19/2012  Referring physician: ER physician. PCP: [name], MD   Chief Complaint: Left leg swelling and redness.  HPI: Chad [name] is a 57 y.o. male history of chronic kidney disease status post renal transplant, nonischemic cardiomyopathy last EF was 30-35%, atrial fibrillation, who has had a recent left femur fracture status post ORIF presented to the ER because of left leg swelling and erythema. Patient states that after his surgery patient was in rehabilitation and was discharged home. 2 days ago his Neomia Dear boot was removed and his left leg swelling started worsening. To also had erythema but denies any fever chills. In the ER Dopplers were done which was negative for DVT and has been admitted for cellulitis. Patient states that the wound around the surgical site in the left thigh area has been nonhealing with discharge. But over the last 2 days and also has started developing some odour. Patient otherwise denies any chest pain or shortness of breath nausea vomiting or abdominal pain.  Review of Systems: As presented in the history of presenting illness, rest negative.  Past Medical History  Diagnosis Date  . Hypertension   . Hepatitis C 1996  . CKD (chronic kidney disease), stage IV     a. s/p renal transplant 1996.  Marland Kitchen Pneumonia   . HLD (hyperlipidemia)   . Nonischemic cardiomyopathy     a. unknown etiology, EF 30-35% by echo 09/2011;  b. 09/2011 Lexi MV EF 42%, no ischemia/infarct.  . Systolic CHF   . Atrial flutter     a. in the setting of sepsis 05/2011; on amio; no anticoagulation   Past Surgical History  Procedure Laterality Date  . Nephrectomy transplanted organ    . Insertion of dialysis catheter  06/20/2011    Procedure: INSERTION OF DIALYSIS CATHETER;  Surgeon: [name], MD;  Location: [facility] OR;  Service: Vascular;  Laterality: Right;  Ultrasound guided insertion of right  internal jugular dialysis catheter  . Multiple extractions with alveoloplasty  06/27/2011    Procedure: MULTIPLE EXTRACION WITH ALVEOLOPLASTY;  Surgeon: [name], DDS;  Location: [facility] OR;  Service: Oral Surgery;  Laterality: N/A;  Extraction  of tooth # 14 with alveoloplasty  . Hip surgery    . Joint replacement      left hip  . Femur fracture surgery Left 07/09/2012    Dr Deno Etienne  . Femur im nail Left 07/10/2012    Procedure: IM FEMORAL NAIL;  Surgeon: Toni Arthurs, MD;  Location: [facility] OR;  Service: Orthopedics;  Laterality: Left;   Social History:  reports that he has never smoked. He has never used smokeless tobacco. He reports that he does not drink alcohol or use illicit drugs. Home. where does patient live-- Can do ADLs. Can patient participate in ADLs?  No Known Allergies  Family History  Problem Relation Age of Onset  . Heart disease Neg Hx       Prior to Admission medications   Medication Sig Start Date End Date Taking? Authorizing Provider  allopurinol (ZYLOPRIM) 100 MG tablet Take 1 tablet (100 mg total) by mouth 2 (two) times daily. 08/05/12  Yes Daniel J Angiulli, PA-C  amiodarone (PACERONE) 200 MG tablet Take 1 tablet (200 mg total) by mouth daily. 08/05/12  Yes Daniel J Angiulli, PA-C  aspirin EC 81 MG tablet Take 1 tablet (81 mg total) by mouth daily. 08/05/12  Yes Daniel J Angiulli, PA-C  carvedilol (  COREG) 25 MG tablet Take 1 tablet (25 mg total) by mouth 2 (two) times daily with a meal. 08/05/12  Yes Daniel J Angiulli, PA-C  furosemide (LASIX) 20 MG tablet Take 5 tablets (100 mg total) by mouth 2 (two) times daily. 08/05/12  Yes Daniel J Angiulli, PA-C  hydrALAZINE (APRESOLINE) 50 MG tablet Take 1 tablet (50 mg total) by mouth 3 (three) times daily. 08/05/12  Yes Daniel J Angiulli, PA-C  isosorbide mononitrate (IMDUR) 60 MG 24 hr tablet Take 1 tablet (60 mg total) by mouth daily. 08/05/12  Yes Daniel J Angiulli, PA-C  methocarbamol (ROBAXIN) 500 MG tablet Take 500 mg by mouth  every 6 (six) hours as needed (For muscle pain). 08/05/12  Yes Daniel J Angiulli, PA-C  omeprazole (PRILOSEC) 20 MG capsule Take 1 capsule (20 mg total) by mouth daily. 08/05/12  Yes Daniel J Angiulli, PA-C  oxyCODONE (OXY IR/ROXICODONE) 5 MG immediate release tablet Take 5-10 mg by mouth every 3 (three) hours as needed for pain. 08/05/12  Yes Daniel J Angiulli, PA-C  potassium chloride SA (K-DUR,KLOR-CON) 20 MEQ tablet Take 1 tablet (20 mEq total) by mouth daily. 08/05/12  Yes Daniel J Angiulli, PA-C  pravastatin (PRAVACHOL) 40 MG tablet Take 1 tablet (40 mg total) by mouth daily. 08/05/12  Yes Daniel J Angiulli, PA-C  predniSONE (DELTASONE) 10 MG tablet Take 1 tablet (10 mg total) by mouth daily. 08/05/12  Yes Daniel J Angiulli, PA-C  sirolimus (RAPAMUNE) 1 MG/ML solution Take 1.2 mg by mouth daily.   Yes Historical Provider, MD   Physical Exam: Filed Vitals:   08/19/12 1930 08/19/12 1945 08/19/12 2000 08/19/12 2106  BP: 159/93  164/97 137/80  Pulse: 75 74 74 76  Temp:    98.5 F (36.9 C)  TempSrc:    Oral  Resp:    16  Height:      Weight:      SpO2: 96% 96% 96% 92%     General:  Well-developed well-nourished.  Eyes: Anicteric no pallor.  ENT: No discharge from ears eyes nose mouth.  Neck: No mass felt.  Cardiovascular: S1-S2 heard.  Respiratory: No rhonchi or crepitations.  Abdomen: Soft nontender bowel sounds present.  Skin: Left leg lateral thigh as wound with mild discharge. Left leg has erythema and swelling. No acute ischemic changes.  Musculoskeletal: See skin changes.  Psychiatric: Appears normal.  Neurologic: Alert awake oriented to time place and person. Moves all extremities.  Labs on Admission:  Basic Metabolic Panel:  Recent Labs Lab 08/19/12 1646 08/19/12 1934  NA 134* 137  K HEMOLYZED SPECIMEN, RESULTS MAY BE AFFECTED 3.7  CL 93* 96  CO2 30 31  GLUCOSE 126* 109*  BUN 56* 54*  CREATININE 3.25* 3.17*  CALCIUM 9.5 9.2   Liver Function Tests: No  results found for this basename: AST, ALT, ALKPHOS, BILITOT, PROT, ALBUMIN,  in the last 168 hours No results found for this basename: LIPASE, AMYLASE,  in the last 168 hours No results found for this basename: AMMONIA,  in the last 168 hours CBC:  Recent Labs Lab 08/19/12 1646  WBC 11.4*  NEUTROABS 9.5*  HGB 12.9*  HCT 41.9  MCV 88.2  PLT 179   Cardiac Enzymes: No results found for this basename: CKTOTAL, CKMB, CKMBINDEX, TROPONINI,  in the last 168 hours  BNP (last 3 results)  Recent Labs  12/30/11 1057 03/12/12 0449 03/15/12 0440  PROBNP 534.0* 14215.0* 30340.0*   CBG: No results found for this basename: GLUCAP,  in the last 168 hours  Radiological Exams on Admission: No results found.   Assessment/Plan Principal Problem:   Cellulitis of leg, left Active Problems:   Chronic systolic CHF (congestive heart failure)   CKD (chronic kidney disease)   PAF (paroxysmal atrial fibrillation)   History of renal transplant   1. Cellulitis of the left lower extremity - continue with antibiotics. Get x-rays to rule out any bone involvement. 2. Left thigh wound status post recent ORIF - x-rays in order to rule out any bone involvement. Consult orthopedics regarding chronic wound drainage from the surgical site. 3. Chronic kidney disease - continue present medications. Check appears at baseline. 4. CHF/nonischemic cardiomyopathy - appears compensated and continue present medication. Closely follow intake output. 5. Paroxysmal atrial fibrillation - rate controlled. Continue present medication. Patient is on aspirin.    Code Status: Full code.  Family Communication: None.  Disposition Plan: Admit for inpatient.    Otelia Hettinger N. [name] Pager [phone].  If 7PM-7AM, please contact night-coverage [email or web address] Password [facility] 08/19/2012, 11:33 PM

## 2012-08-19 NOTE — ED Provider Notes (Addendum)
I saw and evaluated the patient, reviewed the resident's note and I agree with the findings and plan.  Patient with evidence of large left lower extremity cellulitis.  Antibiotics started.  Ultrasound negative for DVT.  Admit to medicine. patient without any chest pain or shortness of breath.  No pleuritic chest pain   Lyanne Co, MD 08/19/12 2003  Lyanne Co, MD 08/19/12 2003

## 2012-08-19 NOTE — Progress Notes (Signed)
VASCULAR LAB PRELIMINARY  PRELIMINARY  PRELIMINARY  PRELIMINARY  Left lower extremity venous Doppler completed.    Preliminary report:  There is no obvious evidence of DVT or SVT noted in the left lower extremity.  Naika Noto, RVT 08/19/2012, 5:57 PM

## 2012-08-19 NOTE — Progress Notes (Signed)
57 year old gentleman with h/o stage4 CKD, non ischemic cardiomyopathy, left femur fracture in 06/2012 coming in for left lower extremity cellulitis. He was started on IV vanco and zosyn, dopplers neg for DVT. He was accepted to team 10 to medsurg bed. Needs admit orders and H&P.  Kathlen Mody, MD  [phone]

## 2012-08-19 NOTE — Progress Notes (Addendum)
ANTIBIOTIC CONSULT NOTE - Follow Up  Pharmacy Consult for vancomycin - adding Ciprofloxacin Indication: cellulitis  No Known Allergies  Patient Measurements: Height: 6' 0.83" (185 cm) Weight: 285 lb 15 oz (129.7 kg) IBW/kg (Calculated) : 79.52 Adjusted Body Weight:   Vital Signs: Temp: 98.5 F (36.9 C) (07/24 2106) Temp src: Oral (07/24 2106) BP: 137/80 mmHg (07/24 2106) Pulse Rate: 76 (07/24 2106) Intake/Output from previous day:   Intake/Output from this shift:    Labs:  Recent Labs  08/19/12 1646 08/19/12 1934  WBC 11.4*  --   HGB 12.9*  --   PLT 179  --   CREATININE 3.25* 3.17*   Estimated Creatinine Clearance: 37.1 ml/min (by C-G formula based on Cr of 3.17). No results found for this basename: VANCOTROUGH, VANCOPEAK, VANCORANDOM, GENTTROUGH, GENTPEAK, GENTRANDOM, TOBRATROUGH, TOBRAPEAK, TOBRARND, AMIKACINPEAK, AMIKACINTROU, AMIKACIN,  in the last 72 hours   Microbiology: No results found for this or any previous visit (from the past 720 hour(s)).  Medical History: Past Medical History  Diagnosis Date  . Hypertension   . Hepatitis C 1996  . CKD (chronic kidney disease), stage IV     a. s/p renal transplant 1996.  Marland Kitchen Pneumonia   . HLD (hyperlipidemia)   . Nonischemic cardiomyopathy     a. unknown etiology, EF 30-35% by echo 09/2011;  b. 09/2011 Lexi MV EF 42%, no ischemia/infarct.  . Systolic CHF   . Atrial flutter     a. in the setting of sepsis 05/2011; on amio; no anticoagulation    Medications:  Anti-infectives   Start     Dose/Rate Route Frequency Ordered Stop   08/21/12 1800  vancomycin (VANCOCIN) 1,500 mg in sodium chloride 0.9 % 500 mL IVPB     1,500 mg 250 mL/hr over 120 Minutes Intravenous Every 48 hours 08/19/12 1933     08/19/12 1700  piperacillin-tazobactam (ZOSYN) IVPB 3.375 g  Status:  Discontinued     3.375 g 12.5 mL/hr over 240 Minutes Intravenous  Once 08/19/12 1646 08/19/12 1647   08/19/12 1700  piperacillin-tazobactam (ZOSYN) IVPB  3.375 g     3.375 g 100 mL/hr over 30 Minutes Intravenous  Once 08/19/12 1648 08/19/12 2024   08/19/12 1700  vancomycin (VANCOCIN) 2,500 mg in sodium chloride 0.9 % 500 mL IVPB     2,500 mg 250 mL/hr over 120 Minutes Intravenous To Emergency Dept 08/19/12 1650 08/19/12 1949     Assessment: 56 yom presented to the ED with LLE swelling and pain. He was started on IV Vancomycin and one dose of Zosyn for large area of cellulitis.  Pt is afebrile, with chronic kidney disease (s/p renal transplant) and a creatinine of 3.17.  His estimated clearance is 37 ml/min.  Given the fact that he is on IV Vancomycin, will use 24 hour dosing interval for his Cipro to ensure adequate clearance and minimize nephrotoxicity with both antibiotics on board and his history of high Vancomycin troughs.  Vanc 7/24>> Zosyn x 1 7/24 Cipro 7/24 >>  Goal of Therapy:  Vancomycin trough level 10-15 mcg/ml  Plan:  1.  Continue Vancomycin 1500mg  IV every 48 hours. 2.  Cipro 400 mg IV every 24 hours. 3.  Monitor renal function closely and adjust as needed. 4.  Will obtain s/s levels of Vancomycin if continued > 72 hours. 5.  Will monitor for drug/drug interactions and potential adverse effects  Nadara Mustard, PharmD., MS Clinical Pharmacist Pager:  [phone] Thank you for allowing pharmacy to be part of this  patients care team. 08/19/2012,11:43 PM

## 2012-08-19 NOTE — ED Notes (Signed)
Per pt having LLE swelling and pain 2 weeks post surgery. sts recently had cast cut off. sts the swelling is intermittent and leg has been draining. Leg very red and swollen

## 2012-08-19 NOTE — ED Provider Notes (Signed)
CSN: [id number]     Arrival date & time 08/19/12  1533 History     First MD Initiated Contact with Patient 08/19/12 1619     Chief Complaint  Patient presents with  . Leg Swelling   (Consider location/radiation/quality/duration/timing/severity/associated sxs/prior Treatment) HPI Comments: Chad [name] is a 57 yo male with a PMH of CKD 4 (s/p kidney transplant 1996), HTN, HCV, nonischemic cardiomyapthy (EF 30-35%) presenting with a 2 day history of L lower extremity swelling.  He broke his L femur in June 2014 with subsequent ORIF.  A nurse has been coming to his home 3 times per week since he was discharged.  He reports that the leg had been a little swollen in the weeks following surgery but increased in size after his Unna boot was removed 2 days ago.  He denies CP/SOB, fevers, change in appetite/bowel/bladder.  He says he also noticed an odor coming from his surgical wounds.  He reported this to his orthopedist at an appointment today and was told to come to the ED.   Past Medical History  Diagnosis Date  . Hypertension   . Hepatitis C 1996  . CKD (chronic kidney disease), stage IV     a. s/p renal transplant 1996.  Marland Kitchen Pneumonia   . HLD (hyperlipidemia)   . Nonischemic cardiomyopathy     a. unknown etiology, EF 30-35% by echo 09/2011;  b. 09/2011 Lexi MV EF 42%, no ischemia/infarct.  . Systolic CHF   . Atrial flutter     a. in the setting of sepsis 05/2011; on amio; no anticoagulation   Past Surgical History  Procedure Laterality Date  . Nephrectomy transplanted organ    . Insertion of dialysis catheter  06/20/2011    Procedure: INSERTION OF DIALYSIS CATHETER;  Surgeon: [name], MD;  Location: [facility] OR;  Service: Vascular;  Laterality: Right;  Ultrasound guided insertion of right internal jugular dialysis catheter  . Multiple extractions with alveoloplasty  06/27/2011    Procedure: MULTIPLE EXTRACION WITH ALVEOLOPLASTY;  Surgeon: [name], DDS;  Location: [facility] OR;  Service:  Oral Surgery;  Laterality: N/A;  Extraction  of tooth # 14 with alveoloplasty  . Hip surgery    . Joint replacement      left hip  . Femur fracture surgery Left 07/09/2012    Dr Deno Etienne  . Femur im nail Left 07/10/2012    Procedure: IM FEMORAL NAIL;  Surgeon: Toni Arthurs, MD;  Location: [facility] OR;  Service: Orthopedics;  Laterality: Left;   Family History  Problem Relation Age of Onset  . Heart disease Neg Hx    History  Substance Use Topics  . Smoking status: Never Smoker   . Smokeless tobacco: Never Used  . Alcohol Use: No    Review of Systems  Constitutional: Negative for fever, appetite change and fatigue.  Respiratory: Negative for shortness of breath.   Cardiovascular: Positive for leg swelling. Negative for chest pain.  Gastrointestinal: Negative for diarrhea and constipation.  Genitourinary: Negative for difficulty urinating.    Allergies  Review of patient's allergies indicates no known allergies.  Home Medications   Current Outpatient Rx  Name  Route  Sig  Dispense  Refill  . allopurinol (ZYLOPRIM) 100 MG tablet   Oral   Take 1 tablet (100 mg total) by mouth 2 (two) times daily.   60 tablet   1   . amiodarone (PACERONE) 200 MG tablet   Oral   Take 1 tablet (200 mg total)  by mouth daily.   30 tablet   1   . aspirin EC 81 MG tablet   Oral   Take 1 tablet (81 mg total) by mouth daily.         . carvedilol (COREG) 25 MG tablet   Oral   Take 1 tablet (25 mg total) by mouth 2 (two) times daily with a meal.   60 tablet   1   . furosemide (LASIX) 20 MG tablet   Oral   Take 5 tablets (100 mg total) by mouth 2 (two) times daily.   60 tablet   1   . hydrALAZINE (APRESOLINE) 50 MG tablet   Oral   Take 1 tablet (50 mg total) by mouth 3 (three) times daily.   90 tablet   1   . isosorbide mononitrate (IMDUR) 60 MG 24 hr tablet   Oral   Take 1 tablet (60 mg total) by mouth daily.   30 tablet   2   . methocarbamol (ROBAXIN) 500 MG tablet   Oral   Take 500  mg by mouth every 6 (six) hours as needed (For muscle pain).         Marland Kitchen omeprazole (PRILOSEC) 20 MG capsule   Oral   Take 1 capsule (20 mg total) by mouth daily.   30 capsule   1   . oxyCODONE (OXY IR/ROXICODONE) 5 MG immediate release tablet   Oral   Take 5-10 mg by mouth every 3 (three) hours as needed for pain.         . potassium chloride SA (K-DUR,KLOR-CON) 20 MEQ tablet   Oral   Take 1 tablet (20 mEq total) by mouth daily.   30 tablet   1   . pravastatin (PRAVACHOL) 40 MG tablet   Oral   Take 1 tablet (40 mg total) by mouth daily.   30 tablet   1   . predniSONE (DELTASONE) 10 MG tablet   Oral   Take 1 tablet (10 mg total) by mouth daily.   30 tablet   1   . sirolimus (RAPAMUNE) 1 MG/ML solution   Oral   Take 1.2 mg by mouth daily.          BP 148/89  Pulse 78  Temp(Src) 98.2 F (36.8 C) (Oral)  Resp 18  SpO2 94% Physical Exam  Constitutional: He is oriented to person, place, and time. He appears well-developed. No distress.  HENT:  Head: Normocephalic and atraumatic.  Mouth/Throat: Oropharynx is clear and moist.  Eyes: EOM are normal. Pupils are equal, round, and reactive to light.  Cardiovascular: Normal rate, regular rhythm and normal heart sounds.   Pulmonary/Chest: Effort normal and breath sounds normal.  Abdominal: Soft. Bowel sounds are normal. He exhibits no distension. There is no tenderness.  Musculoskeletal: He exhibits edema and tenderness.  Left lower extremity edematous, erythematous and tender from knee to foot (toes spared).  2 healing surgical wounds - L lateral thigh and posterior calf do not appear infected.  Neurological: He is alert and oriented to person, place, and time.  Skin: He is not diaphoretic.  Psychiatric: He has a normal mood and affect. His behavior is normal.    ED Course   Procedures (including critical care time)  Labs Reviewed  CULTURE, BLOOD (ROUTINE X 2)  CULTURE, BLOOD (ROUTINE X 2)  CBC WITH  DIFFERENTIAL  BASIC METABOLIC PANEL   No results found. No diagnosis found.  MDM  PMH of CKD 4 (s/p  kidney transplant 1996), HTN, HCV, nonischemic cardiomyapthy (EF 30-35%) with unilateral left lower extremity edema.    Concern for DVT as patient s/p recent orthopedic surgery and with unilateral edema.  Will obtain LLE duplex US.  Also, concern for cellulitis.  Will obtain CBC, blood cx.  Starting vanc and zosyn.  LLE duplex shows no obvious DVT.  Will admit for cellulitis.  Medicine consulted.  Evelena Peat, DO 08/19/12 1900

## 2012-08-19 NOTE — Progress Notes (Addendum)
ANTIBIOTIC CONSULT NOTE - INITIAL  Pharmacy Consult for vancomycin Indication: cellulitis  No Known Allergies  Patient Measurements:   Adjusted Body Weight:   Vital Signs: Temp: 98.2 F (36.8 C) (07/24 1540) Temp src: Oral (07/24 1540) BP: 148/89 mmHg (07/24 1540) Pulse Rate: 78 (07/24 1540) Intake/Output from previous day:   Intake/Output from this shift:    Labs: No results found for this basename: WBC, HGB, PLT, LABCREA, CREATININE,  in the last 72 hours The CrCl is unknown because both a height and weight (above a minimum accepted value) are required for this calculation. No results found for this basename: VANCOTROUGH, VANCOPEAK, VANCORANDOM, GENTTROUGH, GENTPEAK, GENTRANDOM, TOBRATROUGH, TOBRAPEAK, TOBRARND, AMIKACINPEAK, AMIKACINTROU, AMIKACIN,  in the last 72 hours   Microbiology: No results found for this or any previous visit (from the past 720 hour(s)).  Medical History: Past Medical History  Diagnosis Date  . Hypertension   . Hepatitis C 1996  . CKD (chronic kidney disease), stage IV     a. s/p renal transplant 1996.  Marland Kitchen Pneumonia   . HLD (hyperlipidemia)   . Nonischemic cardiomyopathy     a. unknown etiology, EF 30-35% by echo 09/2011;  b. 09/2011 Lexi MV EF 42%, no ischemia/infarct.  . Systolic CHF   . Atrial flutter     a. in the setting of sepsis 05/2011; on amio; no anticoagulation    Medications:  Anti-infectives   Start     Dose/Rate Route Frequency Ordered Stop   08/19/12 1700  piperacillin-tazobactam (ZOSYN) IVPB 3.375 g  Status:  Discontinued     3.375 g 12.5 mL/hr over 240 Minutes Intravenous  Once 08/19/12 1646 08/19/12 1647   08/19/12 1700  piperacillin-tazobactam (ZOSYN) IVPB 3.375 g     3.375 g 100 mL/hr over 30 Minutes Intravenous  Once 08/19/12 1648     08/19/12 1700  vancomycin (VANCOCIN) 2,500 mg in sodium chloride 0.9 % 500 mL IVPB     2,500 mg 250 mL/hr over 120 Minutes Intravenous To Emergency Dept 08/19/12 1650 08/20/12 1700      Assessment: 56 yom presented to the ED with LLE swelling and pain. To start vancomycin for cellulitis. Pt is afebrile, no labs available yet. MD also ordered a 1x dose of zosyn.   Vanc 7/24>> Zosyn x 1 7/24  Goal of Therapy:  Vancomycin trough level 10-15 mcg/ml  Plan:  1. Vancomycin 2500mg  IV x 1 2. F/u labs for further doses 3. F/u need for continuation of zosyn or other abx  Shakaya Bhullar, Drake Leach 08/19/2012,4:51 PM  Addendum:  Scr is significantly elevated. Will dose conservatively d/t history of high troughs.   Plan: 1. AFter loading dose, starting vanc 1500mg  IV Q48H 2. F/u renal fxn  Lysle Pearl, PharmD, BCPS Pager # [phone] 08/19/2012 7:34 PM

## 2012-08-20 ENCOUNTER — Encounter (HOSPITAL_COMMUNITY): Payer: Self-pay | Admitting: General Practice

## 2012-08-20 ENCOUNTER — Inpatient Hospital Stay (HOSPITAL_COMMUNITY): Payer: Medicaid Other

## 2012-08-20 DIAGNOSIS — I10 Essential (primary) hypertension: Secondary | ICD-10-CM

## 2012-08-20 DIAGNOSIS — N184 Chronic kidney disease, stage 4 (severe): Secondary | ICD-10-CM

## 2012-08-20 LAB — CBC WITH DIFFERENTIAL/PLATELET
Basophils Relative: 1 % (ref 0–1)
Eosinophils Absolute: 0.2 10*3/uL (ref 0.0–0.7)
Eosinophils Relative: 2 % (ref 0–5)
Lymphs Abs: 1.3 10*3/uL (ref 0.7–4.0)
MCH: 26.5 pg (ref 26.0–34.0)
MCHC: 29.4 g/dL — ABNORMAL LOW (ref 30.0–36.0)
MCV: 90.2 fL (ref 78.0–100.0)
Platelets: 156 10*3/uL (ref 150–400)
RBC: 4.3 MIL/uL (ref 4.22–5.81)
RDW: 17.6 % — ABNORMAL HIGH (ref 11.5–15.5)

## 2012-08-20 LAB — COMPREHENSIVE METABOLIC PANEL
ALT: 11 U/L (ref 0–53)
AST: 30 U/L (ref 0–37)
Albumin: 2.6 g/dL — ABNORMAL LOW (ref 3.5–5.2)
Alkaline Phosphatase: 64 U/L (ref 39–117)
CO2: 30 mEq/L (ref 19–32)
Chloride: 95 mEq/L — ABNORMAL LOW (ref 96–112)
GFR calc non Af Amer: 20 mL/min — ABNORMAL LOW (ref >90)
Potassium: 3.5 mEq/L (ref 3.5–5.1)
Total Bilirubin: 0.4 mg/dL (ref 0.3–1.2)

## 2012-08-20 MED ORDER — PIPERACILLIN-TAZOBACTAM 3.375 G IVPB
3.3750 g | Freq: Three times a day (TID) | INTRAVENOUS | Status: DC
  Administered 2012-08-20 – 2012-08-27 (×22): 3.375 g via INTRAVENOUS
  Filled 2012-08-20 (×26): qty 50

## 2012-08-20 NOTE — Progress Notes (Addendum)
Nutrition Brief Note  Patient identified on the Malnutrition Screening Tool (MST) Report for recent weight loss without trying (patient unsure) and eating poorly because of a decreased appetite.  Per readings below, patient has lost some weight; likely due to fluid and desirable given obesity.  Wt Readings from Last 10 Encounters:  08/20/12 266 lb 1.5 oz (120.7 kg)  08/05/12 285 lb 15 oz (129.7 kg)  07/13/12 270 lb (122.471 kg)  07/13/12 270 lb (122.471 kg)  03/16/12 265 lb (120.203 kg)  12/30/11 253 lb (114.76 kg)  12/11/11 233 lb 11 oz (106 kg)  12/03/11 252 lb 12.8 oz (114.669 kg)  11/18/11 241 lb (109.317 kg)  11/13/11 241 lb 9.6 oz (109.589 kg)    Body mass index is 35.27 kg/(m^2). Patient meets criteria for Obesity Class II based on current BMI.   Current diet order is Heart Healthy, patient is consuming approximately 100% of meals at this time. Labs and medications reviewed.   No nutrition interventions warranted at this time. If nutrition issues arise, please consult RD.   Maureen Chatters, RD, LDN Pager #: [phone] After-Hours Pager #: [phone]

## 2012-08-20 NOTE — Progress Notes (Signed)
[name] PROGRESS NOTE  Chad [name] GNF:[id number] DOB: 06-12-1955 DOA: 08/19/2012 PCP: [name], MD  Assessment/Plan: Cellulitis of the left lower extremity  - continue with antibiotics.  - No xray findings of osteo. Left thigh wound status post recent ORIF - May consult orthopedics regarding chronic wound drainage from the surgical site.   Chronic kidney disease - continue present medications. Check appears at baseline. Cont to monitor closely  CHF/nonischemic cardiomyopathy - appears compensated and continue present medication. Closely follow intake output.   Paroxysmal atrial fibrillation - rate controlled. Continue present medication. Patient is on aspirin.  Code Status: Full Family Communication: Pt in room (indicate person spoken with, relationship, and if by phone, the number) Disposition Plan: Pending  Antibiotics:  Vanc 08/19/12>>>  Zosyn 08/19/12>>>  HPI/Subjective: No complaints. No events noted overnight  Objective: Filed Vitals:   08/19/12 2106 08/20/12 0251 08/20/12 0658 08/20/12 1018  BP: 137/80 148/82 142/72 142/70  Pulse: 76 97 90 81  Temp: 98.5 F (36.9 C) 98.8 F (37.1 C) 98.4 F (36.9 C) 98.3 F (36.8 C)  TempSrc: Oral Oral Oral Oral  Resp: 16 16 16 18   Height:      Weight:   120.7 kg (266 lb 1.5 oz)   SpO2: 92% 93% 94% 93%    Intake/Output Summary (Last 24 hours) at 08/20/12 1146 Last data filed at 08/20/12 0900  Gross per 24 hour  Intake    240 ml  Output   1200 ml  Net   -960 ml   Filed Weights   08/19/12 1915 08/20/12 0658  Weight: 129.7 kg (285 lb 15 oz) 120.7 kg (266 lb 1.5 oz)    Exam:   General:  Awake, in nad  Cardiovascular: regular, s1, s2  Respiratory: normal resp effort, no wheezing  Abdomen: soft, nondistended  Musculoskeletal: perfused, LLE with erythema below knee   Data Reviewed: Basic Metabolic Panel:  Recent Labs Lab 08/19/12 1646 08/19/12 1934 08/20/12 0440  NA 134* 137 139  K  HEMOLYZED SPECIMEN, RESULTS MAY BE AFFECTED 3.7 3.5  CL 93* 96 95*  CO2 30 31 30   GLUCOSE 126* 109* 101*  BUN 56* 54* 53*  CREATININE 3.25* 3.17* 3.18*  CALCIUM 9.5 9.2 9.1   Liver Function Tests:  Recent Labs Lab 08/20/12 0440  AST 30  ALT 11  ALKPHOS 64  BILITOT 0.4  PROT 6.9  ALBUMIN 2.6*   No results found for this basename: LIPASE, AMYLASE,  in the last 168 hours No results found for this basename: AMMONIA,  in the last 168 hours CBC:  Recent Labs Lab 08/19/12 1646 08/20/12 0440  WBC 11.4* 9.7  NEUTROABS 9.5* 6.9  HGB 12.9* 11.4*  HCT 41.9 38.8*  MCV 88.2 90.2  PLT 179 156   Cardiac Enzymes: No results found for this basename: CKTOTAL, CKMB, CKMBINDEX, TROPONINI,  in the last 168 hours BNP (last 3 results)  Recent Labs  12/30/11 1057 03/12/12 0449 03/15/12 0440  PROBNP 534.0* 14215.0* 30340.0*   CBG: No results found for this basename: GLUCAP,  in the last 168 hours  Recent Results (from the past 240 hour(s))  CULTURE, BLOOD (ROUTINE X 2)     Status: None   Collection Time    08/19/12  5:15 PM      Result Value Range Status   Specimen Description BLOOD ARM RIGHT   Final   Special Requests BOTTLES DRAWN AEROBIC ONLY [facility]   Final   Culture  Setup Time 08/19/2012 22:47  Final   Culture     Final   Value:        BLOOD CULTURE RECEIVED NO GROWTH TO DATE CULTURE WILL BE HELD FOR 5 DAYS BEFORE ISSUING A FINAL NEGATIVE REPORT   Report Status PENDING   Incomplete     Studies: Dg Femur Left  08/20/2012   *RADIOLOGY REPORT*  Clinical Data: Swelling, redness, and pain starting around the left ankle and extending  up the left leg.  Recent fracture and surgery.  LEFT FEMUR - 2 VIEW  Comparison: 07/16/2012  Findings: Postoperative changes with lateral plate and screw fixation of a comminuted fracture of the distal left femoral metaphysis.  Fracture fragments appears stable in position since previous study.  Hardware components appear intact without change in  position.  There is some loss of distinction of the fracture line and periosteal reaction suggesting healing.  No focal bone lesion or bone destruction is appreciated.  No cortical destruction or erosion. Soft tissue swelling distal to the patella may represent hematoma or edema.  Small left knee effusion.  No radiopaque soft tissue foreign body or soft tissue gas collections are demonstrated.  Vascular calcifications.  Left hip arthroplasty.  IMPRESSION: No specific evidence of osteomyelitis.  Plate and screw fixation of a healing comminuted transverse fracture of the distal left femoral metaphysis.   Original Report Authenticated By: Burman Nieves, M.D.   Dg Tibia/fibula Left  08/20/2012   *RADIOLOGY REPORT*  Clinical Data: Swelling, redness, and pain starting around the left ankle and extending proximally of the left leg.  Recent fracture and surgery to the distal femur 07/10/2012.  LEFT TIBIA AND FIBULA - 2 VIEW  Comparison: Left femur 07/16/2012  Findings: AP views of the left tibia and fibula are obtained. There is focal periosteal reaction along the distal aspect of the left tibia and less promptly along the metaphyseal region of the distal tibia.  These changes could be due to healing nondisplaced fractures or stress fractures.  Osteomyelitis less likely.  There is no evidence of any cortical destruction or bone lucency.  No radiopaque soft tissue foreign bodies or soft tissue gas collections.  IMPRESSION: Periosteal reaction is demonstrated along the distal left tibia and fibula may represent healing fractures or stress fractures.  No specific evidence of osteomyelitis.  No soft tissue gas collections.   Original Report Authenticated By: Burman Nieves, M.D.    Scheduled Meds: . allopurinol  100 mg Oral BID  . amiodarone  200 mg Oral Daily  . aspirin EC  81 mg Oral Daily  . carvedilol  25 mg Oral BID WC  . furosemide  100 mg Oral BID  . heparin  5,000 Units Subcutaneous Q8H  . hydrALAZINE   50 mg Oral Q8H  . isosorbide mononitrate  60 mg Oral Daily  . pantoprazole  40 mg Oral Daily  . piperacillin-tazobactam (ZOSYN)  IV  3.375 g Intravenous Q8H  . potassium chloride SA  20 mEq Oral Daily  . predniSONE  10 mg Oral Q breakfast  . simvastatin  20 mg Oral q1800  . sirolimus  1.2 mg Oral Daily  . sodium chloride  3 mL Intravenous Q12H  . [START ON 08/21/2012] vancomycin  1,500 mg Intravenous Q48H   Continuous Infusions:   Principal Problem:   Cellulitis of leg, left Active Problems:   Chronic systolic CHF (congestive heart failure)   CKD (chronic kidney disease)   PAF (paroxysmal atrial fibrillation)   History of renal transplant    Time  spent:    Soul Hackman K  [name] Pager [phone]. If 7PM-7AM, please contact night-coverage at [email or web address], password [facility] 08/20/2012, 11:46 AM  LOS: 1 day

## 2012-08-20 NOTE — Consult Note (Signed)
Reason for Consult:  Left leg swelling Referring Physician:  Dr. Quentin Angst is an 57 y.o. male.  HPI: 58 y/o male now almost 6 weeks post op from ORIF of left supracondylar femur fracture.  He presented to clinic yesterday with swelling of the left lower extremity.  He was sent to the ER with concern for DVT.  Pt denies any pain at the left knee.  He denies f/c/n/v.  He has been at home now for two weeks after spending several weeks at [facility].  He has been NWB on the L LE.  He has been FWB on the R LE and pivoting for transfers.  Past Medical History  Diagnosis Date  . Hypertension   . Hepatitis C 1996  . CKD (chronic kidney disease), stage IV     a. s/p renal transplant 1996.  Marland Kitchen Pneumonia   . HLD (hyperlipidemia)   . Nonischemic cardiomyopathy     a. unknown etiology, EF 30-35% by echo 09/2011;  b. 09/2011 Lexi MV EF 42%, no ischemia/infarct.  . Systolic CHF   . Atrial flutter     a. in the setting of sepsis 05/2011; on amio; no anticoagulation    Past Surgical History  Procedure Laterality Date  . Nephrectomy transplanted organ    . Insertion of dialysis catheter  06/20/2011    Procedure: INSERTION OF DIALYSIS CATHETER;  Surgeon: [name], MD;  Location: [facility] OR;  Service: Vascular;  Laterality: Right;  Ultrasound guided insertion of right internal jugular dialysis catheter  . Multiple extractions with alveoloplasty  06/27/2011    Procedure: MULTIPLE EXTRACION WITH ALVEOLOPLASTY;  Surgeon: [name], DDS;  Location: [facility] OR;  Service: Oral Surgery;  Laterality: N/A;  Extraction  of tooth # 14 with alveoloplasty  . Hip surgery    . Joint replacement      left hip  . Femur fracture surgery Left 07/09/2012    Dr Deno Etienne  . Femur im nail Left 07/10/2012    Procedure: IM FEMORAL NAIL;  Surgeon: Toni Arthurs, MD;  Location: [facility] OR;  Service: Orthopedics;  Laterality: Left;    Family History  Problem Relation Age of Onset  . Heart disease Neg Hx     Social History:   reports that he has never smoked. He has never used smokeless tobacco. He reports that he does not drink alcohol or use illicit drugs.  Allergies: No Known Allergies  Medications: I have reviewed the patient's current medications.  Results for orders placed during the hospital encounter of 08/19/12 (from the past 48 hour(s))  CBC WITH DIFFERENTIAL     Status: Abnormal   Collection Time    08/19/12  4:46 PM      Result Value Range   WBC 11.4 (*) 4.0 - 10.5 K/uL   Comment: WHITE COUNT CONFIRMED ON SMEAR   RBC 4.75  4.22 - 5.81 MIL/uL   Hemoglobin 12.9 (*) 13.0 - 17.0 g/dL   HCT 16.1  09.6 - 04.5 %   MCV 88.2  78.0 - 100.0 fL   MCH 27.2  26.0 - 34.0 pg   MCHC 30.8  30.0 - 36.0 g/dL   RDW 40.9 (*) 81.1 - 91.4 %   Platelets 179  150 - 400 K/uL   Comment: PLATELET COUNT CONFIRMED BY SMEAR     REPEATED TO VERIFY     SPECIMEN CHECKED FOR CLOTS   Neutrophils Relative % 83 (*) 43 - 77 %   Lymphocytes Relative 10 (*)  12 - 46 %   Monocytes Relative 5  3 - 12 %   Eosinophils Relative 2  0 - 5 %   Basophils Relative 0  0 - 1 %   Neutro Abs 9.5 (*) 1.7 - 7.7 K/uL   Lymphs Abs 1.1  0.7 - 4.0 K/uL   Monocytes Absolute 0.6  0.1 - 1.0 K/uL   Eosinophils Absolute 0.2  0.0 - 0.7 K/uL   Basophils Absolute 0.0  0.0 - 0.1 K/uL   RBC Morphology POLYCHROMASIA PRESENT     Comment: TEARDROP CELLS   WBC Morphology TOXIC GRANULATION     Smear Review LARGE PLATELETS PRESENT    BASIC METABOLIC PANEL     Status: Abnormal   Collection Time    08/19/12  4:46 PM      Result Value Range   Sodium 134 (*) 135 - 145 mEq/L   Comment: HEMOLYZED SPECIMEN, RESULTS MAY BE AFFECTED   Potassium HEMOLYZED SPECIMEN, RESULTS MAY BE AFFECTED  3.5 - 5.1 mEq/L   Chloride 93 (*) 96 - 112 mEq/L   Comment: HEMOLYZED SPECIMEN, RESULTS MAY BE AFFECTED   CO2 30  19 - 32 mEq/L   Comment: HEMOLYZED SPECIMEN, RESULTS MAY BE AFFECTED   Glucose, Bld 126 (*) 70 - 99 mg/dL   Comment: HEMOLYZED SPECIMEN, RESULTS MAY BE AFFECTED    BUN 56 (*) 6 - 23 mg/dL   Comment: HEMOLYZED SPECIMEN, RESULTS MAY BE AFFECTED   Creatinine, Ser 3.25 (*) 0.50 - 1.35 mg/dL   Comment: HEMOLYZED SPECIMEN, RESULTS MAY BE AFFECTED   Calcium 9.5  8.4 - 10.5 mg/dL   Comment: HEMOLYZED SPECIMEN, RESULTS MAY BE AFFECTED   GFR calc non Af Amer 20 (*) >90 mL/min   Comment: HEMOLYZED SPECIMEN, RESULTS MAY BE AFFECTED   GFR calc Af Amer 23 (*) >90 mL/min   Comment:            The eGFR has been calculated     using the CKD EPI equation.     This calculation has not been     validated in all clinical     situations.     eGFR's persistently     <90 mL/min signify     possible Chronic Kidney Disease.     HEMOLYZED SPECIMEN, RESULTS MAY BE AFFECTED  CULTURE, BLOOD (ROUTINE X 2)     Status: None   Collection Time    08/19/12  5:15 PM      Result Value Range   Specimen Description BLOOD ARM RIGHT     Special Requests BOTTLES DRAWN AEROBIC ONLY 7CC     Culture  Setup Time 08/19/2012 22:47     Culture       Value:        BLOOD CULTURE RECEIVED NO GROWTH TO DATE CULTURE WILL BE HELD FOR 5 DAYS BEFORE ISSUING A FINAL NEGATIVE REPORT   Report Status PENDING    BASIC METABOLIC PANEL     Status: Abnormal   Collection Time    08/19/12  7:34 PM      Result Value Range   Sodium 137  135 - 145 mEq/L   Potassium 3.7  3.5 - 5.1 mEq/L   Chloride 96  96 - 112 mEq/L   CO2 31  19 - 32 mEq/L   Glucose, Bld 109 (*) 70 - 99 mg/dL   BUN 54 (*) 6 - 23 mg/dL   Creatinine, Ser 4.54 (*) 0.50 - 1.35 mg/dL   Calcium 9.2  8.4 - 10.5 mg/dL   GFR calc non Af Amer 21 (*) >90 mL/min   GFR calc Af Amer 24 (*) >90 mL/min   Comment:            The eGFR has been calculated     using the CKD EPI equation.     This calculation has not been     validated in all clinical     situations.     eGFR's persistently     <90 mL/min signify     possible Chronic Kidney Disease.  COMPREHENSIVE METABOLIC PANEL     Status: Abnormal   Collection Time    08/20/12  4:40 AM       Result Value Range   Sodium 139  135 - 145 mEq/L   Potassium 3.5  3.5 - 5.1 mEq/L   Chloride 95 (*) 96 - 112 mEq/L   CO2 30  19 - 32 mEq/L   Glucose, Bld 101 (*) 70 - 99 mg/dL   BUN 53 (*) 6 - 23 mg/dL   Creatinine, Ser 1.61 (*) 0.50 - 1.35 mg/dL   Calcium 9.1  8.4 - 09.6 mg/dL   Total Protein 6.9  6.0 - 8.3 g/dL   Albumin 2.6 (*) 3.5 - 5.2 g/dL   AST 30  0 - 37 U/L   ALT 11  0 - 53 U/L   Alkaline Phosphatase 64  39 - 117 U/L   Total Bilirubin 0.4  0.3 - 1.2 mg/dL   GFR calc non Af Amer 20 (*) >90 mL/min   GFR calc Af Amer 24 (*) >90 mL/min   Comment:            The eGFR has been calculated     using the CKD EPI equation.     This calculation has not been     validated in all clinical     situations.     eGFR's persistently     <90 mL/min signify     possible Chronic Kidney Disease.  CBC WITH DIFFERENTIAL     Status: Abnormal   Collection Time    08/20/12  4:40 AM      Result Value Range   WBC 9.7  4.0 - 10.5 K/uL   RBC 4.30  4.22 - 5.81 MIL/uL   Hemoglobin 11.4 (*) 13.0 - 17.0 g/dL   HCT 04.5 (*) 40.9 - 81.1 %   MCV 90.2  78.0 - 100.0 fL   MCH 26.5  26.0 - 34.0 pg   MCHC 29.4 (*) 30.0 - 36.0 g/dL   RDW 91.4 (*) 78.2 - 95.6 %   Platelets 156  150 - 400 K/uL   Neutrophils Relative % 71  43 - 77 %   Neutro Abs 6.9  1.7 - 7.7 K/uL   Lymphocytes Relative 13  12 - 46 %   Lymphs Abs 1.3  0.7 - 4.0 K/uL   Monocytes Relative 13 (*) 3 - 12 %   Monocytes Absolute 1.3 (*) 0.1 - 1.0 K/uL   Eosinophils Relative 2  0 - 5 %   Eosinophils Absolute 0.2  0.0 - 0.7 K/uL   Basophils Relative 1  0 - 1 %   Basophils Absolute 0.1  0.0 - 0.1 K/uL    Dg Femur Left  08/20/2012   *RADIOLOGY REPORT*  Clinical Data: Swelling, redness, and pain starting around the left ankle and extending  up the left leg.  Recent fracture and surgery.  LEFT FEMUR -  2 VIEW  Comparison: 07/16/2012  Findings: Postoperative changes with lateral plate and screw fixation of a comminuted fracture of the distal left  femoral metaphysis.  Fracture fragments appears stable in position since previous study.  Hardware components appear intact without change in position.  There is some loss of distinction of the fracture line and periosteal reaction suggesting healing.  No focal bone lesion or bone destruction is appreciated.  No cortical destruction or erosion. Soft tissue swelling distal to the patella may represent hematoma or edema.  Small left knee effusion.  No radiopaque soft tissue foreign body or soft tissue gas collections are demonstrated.  Vascular calcifications.  Left hip arthroplasty.  IMPRESSION: No specific evidence of osteomyelitis.  Plate and screw fixation of a healing comminuted transverse fracture of the distal left femoral metaphysis.   Original Report Authenticated By: Burman Nieves, M.D.   Dg Tibia/fibula Left  08/20/2012   *RADIOLOGY REPORT*  Clinical Data: Swelling, redness, and pain starting around the left ankle and extending proximally of the left leg.  Recent fracture and surgery to the distal femur 07/10/2012.  LEFT TIBIA AND FIBULA - 2 VIEW  Comparison: Left femur 07/16/2012  Findings: AP views of the left tibia and fibula are obtained. There is focal periosteal reaction along the distal aspect of the left tibia and less promptly along the metaphyseal region of the distal tibia.  These changes could be due to healing nondisplaced fractures or stress fractures.  Osteomyelitis less likely.  There is no evidence of any cortical destruction or bone lucency.  No radiopaque soft tissue foreign bodies or soft tissue gas collections.  IMPRESSION: Periosteal reaction is demonstrated along the distal left tibia and fibula may represent healing fractures or stress fractures.  No specific evidence of osteomyelitis.  No soft tissue gas collections.   Original Report Authenticated By: Burman Nieves, M.D.    ROS:  As above PE:  Blood pressure 134/70, pulse 73, temperature 98.9 F (37.2 C), temperature  source Oral, resp. rate 20, height 6' 0.84" (1.85 m), weight 120.7 kg (266 lb 1.5 oz), SpO2 92.00%. Obese male in nad.  A and O.  Mood and affect normal.  EOMI.  resp unlabored.  R thigh with healing surgical incision with a 1 cm open area centrally.  No erythema.  Serous fluid present.  NTTP.  No fluctuance.  5/5 strength at the quad and hamstring.  Sens to LT intact at the left LE.    Assessment/Plan: L thigh wound drainage.  At this point I'd like to see how he responds to a few days of IV abx.  If no significant improvement, then we'll consider I and D of the wound with possible wound VAC.  He can be TTWB in PT and continue with the brace.  I'll continue to follow.  Toni Arthurs 08/20/2012, 5:50 PM

## 2012-08-21 DIAGNOSIS — I5022 Chronic systolic (congestive) heart failure: Secondary | ICD-10-CM

## 2012-08-21 DIAGNOSIS — I509 Heart failure, unspecified: Secondary | ICD-10-CM

## 2012-08-21 LAB — CBC WITH DIFFERENTIAL/PLATELET
Eosinophils Relative: 2 % (ref 0–5)
Hemoglobin: 11.2 g/dL — ABNORMAL LOW (ref 13.0–17.0)
Lymphocytes Relative: 14 % (ref 12–46)
Lymphs Abs: 1.3 10*3/uL (ref 0.7–4.0)
MCV: 90.3 fL (ref 78.0–100.0)
Neutrophils Relative %: 73 % (ref 43–77)
Platelets: 154 10*3/uL (ref 150–400)
RBC: 4.11 MIL/uL — ABNORMAL LOW (ref 4.22–5.81)
WBC: 9.5 10*3/uL (ref 4.0–10.5)

## 2012-08-21 LAB — COMPREHENSIVE METABOLIC PANEL
ALT: 10 U/L (ref 0–53)
Alkaline Phosphatase: 60 U/L (ref 39–117)
CO2: 31 mEq/L (ref 19–32)
GFR calc Af Amer: 22 mL/min — ABNORMAL LOW (ref >90)
GFR calc non Af Amer: 19 mL/min — ABNORMAL LOW (ref >90)
Glucose, Bld: 110 mg/dL — ABNORMAL HIGH (ref 70–99)
Potassium: 3.8 mEq/L (ref 3.5–5.1)
Sodium: 138 mEq/L (ref 135–145)

## 2012-08-21 NOTE — Progress Notes (Signed)
   Subjective: Hospital day - 2 Patient reports pain as mild.   Patient seen in rounds with Dr. Lequita Halt. Patient is well, and has had no acute complaints or problems Minimal drainage today.  Objective: Vital signs in last 24 hours: Temp:  [98.3 F (36.8 C)-98.9 F (37.2 C)] 98.6 F (37 C) (07/26 0514) Pulse Rate:  [73-83] 83 (07/26 0514) Resp:  [18-20] 18 (07/26 0514) BP: (126-142)/(70-81) 126/70 mmHg (07/26 0514) SpO2:  [90 %-98 %] 90 % (07/26 0514)  Intake/Output from previous day:  Intake/Output Summary (Last 24 hours) at 08/21/12 0839 Last data filed at 08/21/12 0514  Gross per 24 hour  Intake   1010 ml  Output    850 ml  Net    160 ml    Intake/Output this shift:    Labs:  Recent Labs  08/19/12 1646 08/20/12 0440 08/21/12 0510  HGB 12.9* 11.4* 11.2*    Recent Labs  08/20/12 0440 08/21/12 0510  WBC 9.7 9.5  RBC 4.30 4.11*  HCT 38.8* 37.1*  PLT 156 154    Recent Labs  08/20/12 0440 08/21/12 0510  NA 139 138  K 3.5 3.8  CL 95* 96  CO2 30 31  BUN 53* 53*  CREATININE 3.18* 3.40*  GLUCOSE 101* 110*  CALCIUM 9.1 8.7   No results found for this basename: LABPT, INR,  in the last 72 hours  EXAM General - Patient is Alert, Appropriate and Oriented Extremity - Neurovascular intact Sensation intact distally minimal drainage from a previous interlock screw site, no purulence, onluy scant serous drainage Dressing - scant drainage Motor Function - intact, moving foot and toes well on exam.   Past Medical History  Diagnosis Date  . Hypertension   . Hepatitis C 1996  . CKD (chronic kidney disease), stage IV     a. s/p renal transplant 1996.  Marland Kitchen Pneumonia   . HLD (hyperlipidemia)   . Nonischemic cardiomyopathy     a. unknown etiology, EF 30-35% by echo 09/2011;  b. 09/2011 Lexi MV EF 42%, no ischemia/infarct.  . Systolic CHF   . Atrial flutter     a. in the setting of sepsis 05/2011; on amio; no anticoagulation    Assessment/Plan: Hospital  day - 2 Principal Problem:   Cellulitis of leg, left Active Problems:   Chronic systolic CHF (congestive heart failure)   CKD (chronic kidney disease)   PAF (paroxysmal atrial fibrillation)   History of renal transplant  Estimated body mass index is 35.27 kg/(m^2) as calculated from the following:   Height as of this encounter: 6' 0.84" (1.85 m).   Weight as of this encounter: 120.7 kg (266 lb 1.5 oz). Up with therapy Continue ABX Continue to monitor thigh and wound  Chad [name] 08/21/2012, 8:39 AM

## 2012-08-21 NOTE — Progress Notes (Signed)
[name] PROGRESS NOTE  Chad [name]:[id number] DOB: September 14, 1955 DOA: 08/19/2012 PCP: [name], MD  Assessment/Plan: Cellulitis of the left lower extremity  - continue with antibiotics.  - Afebrile, no leukocytosis - No xray findings of osteo. Left thigh wound status post recent ORIF  - Orho following for chronic wound drainage from the surgical site.  Chronic kidney disease - continue present medications. Check appears at baseline. Cont to monitor closely  CHF/nonischemic cardiomyopathy - appears compensated and continue present medication. Closely follow intake output.  Paroxysmal atrial fibrillation - rate controlled. Continue present medication. Patient is on aspirin.  Code Status: Full Family Communication: Pt in room (indicate person spoken with, relationship, and if by phone, the number) Disposition Plan: Pending   Consultants:  Orthopedic Surgery  Antibiotics: Vanc 08/19/12>>>  Zosyn 08/19/12>>>  HPI/Subjective: No complaints. No acute events overnight. Pt reports some improvement in LE edema  Objective: Filed Vitals:   08/20/12 1434 08/20/12 1750 08/20/12 2142 08/21/12 0514  BP: 134/70 140/81 140/78 126/70  Pulse: 73 74 74 83  Temp: 98.9 F (37.2 C) 98.8 F (37.1 C) 98.9 F (37.2 C) 98.6 F (37 C)  TempSrc: Oral Oral Oral Oral  Resp: 20 19 18 18   Height:      Weight:      SpO2: 92% 98% 90% 90%    Intake/Output Summary (Last 24 hours) at 08/21/12 1303 Last data filed at 08/21/12 0950  Gross per 24 hour  Intake   1250 ml  Output    850 ml  Net    400 ml   Filed Weights   08/19/12 1915 08/20/12 0658  Weight: 129.7 kg (285 lb 15 oz) 120.7 kg (266 lb 1.5 oz)    Exam:   General:  Awake, in nad  Cardiovascular: regular, s1, s2  Respiratory: normal resp effort, no wheezing  Abdomen: soft, nondistended  Musculoskeletal: perfused, no clubbing, LL with orthotic in place. LLE erythematous and edematous   Data Reviewed: Basic  Metabolic Panel:  Recent Labs Lab 08/19/12 1646 08/19/12 1934 08/20/12 0440 08/21/12 0510  NA 134* 137 139 138  K HEMOLYZED SPECIMEN, RESULTS MAY BE AFFECTED 3.7 3.5 3.8  CL 93* 96 95* 96  CO2 30 31 30 31   GLUCOSE 126* 109* 101* 110*  BUN 56* 54* 53* 53*  CREATININE 3.25* 3.17* 3.18* 3.40*  CALCIUM 9.5 9.2 9.1 8.7   Liver Function Tests:  Recent Labs Lab 08/20/12 0440 08/21/12 0510  AST 30 21  ALT 11 10  ALKPHOS 64 60  BILITOT 0.4 0.4  PROT 6.9 6.6  ALBUMIN 2.6* 2.6*   No results found for this basename: LIPASE, AMYLASE,  in the last 168 hours No results found for this basename: AMMONIA,  in the last 168 hours CBC:  Recent Labs Lab 08/19/12 1646 08/20/12 0440 08/21/12 0510  WBC 11.4* 9.7 9.5  NEUTROABS 9.5* 6.9 6.9  HGB 12.9* 11.4* 11.2*  HCT 41.9 38.8* 37.1*  MCV 88.2 90.2 90.3  PLT 179 156 154   Cardiac Enzymes: No results found for this basename: CKTOTAL, CKMB, CKMBINDEX, TROPONINI,  in the last 168 hours BNP (last 3 results)  Recent Labs  12/30/11 1057 03/12/12 0449 03/15/12 0440  PROBNP 534.0* 14215.0* 30340.0*   CBG: No results found for this basename: GLUCAP,  in the last 168 hours  Recent Results (from the past 240 hour(s))  CULTURE, BLOOD (ROUTINE X 2)     Status: None   Collection Time    08/19/12  5:15 PM      Result Value Range Status   Specimen Description BLOOD ARM RIGHT   Final   Special Requests BOTTLES DRAWN AEROBIC ONLY [facility]   Final   Culture  Setup Time 08/19/2012 22:47   Final   Culture     Final   Value:        BLOOD CULTURE RECEIVED NO GROWTH TO DATE CULTURE WILL BE HELD FOR 5 DAYS BEFORE ISSUING A FINAL NEGATIVE REPORT   Report Status PENDING   Incomplete  WOUND CULTURE     Status: None   Collection Time    08/20/12 10:41 AM      Result Value Range Status   Specimen Description WOUND LEG LEFT   Final   Special Requests Immunocompromised   Final   Gram Stain     Final   Value: NO WBC SEEN     NO SQUAMOUS EPITHELIAL  CELLS SEEN     RARE GRAM NEGATIVE RODS   Culture Culture reincubated for better growth   Final   Report Status PENDING   Incomplete     Studies: Dg Femur Left  08/20/2012   *RADIOLOGY REPORT*  Clinical Data: Swelling, redness, and pain starting around the left ankle and extending  up the left leg.  Recent fracture and surgery.  LEFT FEMUR - 2 VIEW  Comparison: 07/16/2012  Findings: Postoperative changes with lateral plate and screw fixation of a comminuted fracture of the distal left femoral metaphysis.  Fracture fragments appears stable in position since previous study.  Hardware components appear intact without change in position.  There is some loss of distinction of the fracture line and periosteal reaction suggesting healing.  No focal bone lesion or bone destruction is appreciated.  No cortical destruction or erosion. Soft tissue swelling distal to the patella may represent hematoma or edema.  Small left knee effusion.  No radiopaque soft tissue foreign body or soft tissue gas collections are demonstrated.  Vascular calcifications.  Left hip arthroplasty.  IMPRESSION: No specific evidence of osteomyelitis.  Plate and screw fixation of a healing comminuted transverse fracture of the distal left femoral metaphysis.   Original Report Authenticated By: Burman Nieves, M.D.   Dg Tibia/fibula Left  08/20/2012   *RADIOLOGY REPORT*  Clinical Data: Swelling, redness, and pain starting around the left ankle and extending proximally of the left leg.  Recent fracture and surgery to the distal femur 07/10/2012.  LEFT TIBIA AND FIBULA - 2 VIEW  Comparison: Left femur 07/16/2012  Findings: AP views of the left tibia and fibula are obtained. There is focal periosteal reaction along the distal aspect of the left tibia and less promptly along the metaphyseal region of the distal tibia.  These changes could be due to healing nondisplaced fractures or stress fractures.  Osteomyelitis less likely.  There is no evidence  of any cortical destruction or bone lucency.  No radiopaque soft tissue foreign bodies or soft tissue gas collections.  IMPRESSION: Periosteal reaction is demonstrated along the distal left tibia and fibula may represent healing fractures or stress fractures.  No specific evidence of osteomyelitis.  No soft tissue gas collections.   Original Report Authenticated By: Burman Nieves, M.D.    Scheduled Meds: . allopurinol  100 mg Oral BID  . amiodarone  200 mg Oral Daily  . aspirin EC  81 mg Oral Daily  . carvedilol  25 mg Oral BID WC  . furosemide  100 mg Oral BID  . heparin  5,000  Units Subcutaneous Q8H  . hydrALAZINE  50 mg Oral Q8H  . isosorbide mononitrate  60 mg Oral Daily  . pantoprazole  40 mg Oral Daily  . piperacillin-tazobactam (ZOSYN)  IV  3.375 g Intravenous Q8H  . potassium chloride SA  20 mEq Oral Daily  . predniSONE  10 mg Oral Q breakfast  . simvastatin  20 mg Oral q1800  . sirolimus  1.2 mg Oral Daily  . sodium chloride  3 mL Intravenous Q12H  . vancomycin  1,500 mg Intravenous Q48H   Continuous Infusions:   Principal Problem:   Cellulitis of leg, left Active Problems:   Chronic systolic CHF (congestive heart failure)   CKD (chronic kidney disease)   PAF (paroxysmal atrial fibrillation)   History of renal transplant    Time spent:    CHIU, STEPHEN K  [name] Pager [phone]. If 7PM-7AM, please contact night-coverage at [email or web address], password [facility] 08/21/2012, 1:03 PM  LOS: 2 days

## 2012-08-21 NOTE — Evaluation (Signed)
Physical Therapy Evaluation Patient Details Name: Chad [name] MRN: [id number] DOB: 1955/02/03 Today's Date: 08/21/2012 Time: 4098-1191 PT Time Calculation (min): 31 min  PT Assessment / Plan / Recommendation History of Present Illness  57 y/o male now almost 6 weeks post op from ORIF of left supracondylar femur fracture. He presented to clinic yesterday with swelling of the left lower extremity. Negative for DVT; +cellulitis. He has been at home now for two weeks after spending several weeks at [facility]. He has been NWB on the L LE. He has been FWB on the R LE and pivoting  Clinical Impression  Pt admitted with cellulitis. Pt s/p almost 6 weeks post op from ORIF of left supracondylar femur fracture. MD has just now increased him from NWB to TDWB LLE and allowed ROM as tolerated to Lt knee. Pt currently with functional limitations due to the deficits listed below (see PT Problem List). Pt highly motivated. Pt will benefit from skilled PT to increase their independence and safety with mobility to allow discharge to the venue listed below.       PT Assessment  Patient needs continued PT services    Follow Up Recommendations  Home health PT;Supervision for mobility/OOB    Does the patient have the potential to tolerate intense rehabilitation      Barriers to Discharge        Equipment Recommendations  Rolling walker with 5" wheels (pt would have to pay out-of-pocket likely--insurance paid for his rollator prior to recent injury)    Recommendations for Other Services     Frequency Min 3X/week    Precautions / Restrictions Precautions Precautions: Fall Required Braces or Orthoses: Other Brace/Splint Other Brace/Splint: Bledsoe Brace LLE Restrictions Weight Bearing Restrictions: Yes LLE Weight Bearing: Touchdown weight bearing Other Position/Activity Restrictions: ROM to lt knee as tolerated   Pertinent Vitals/Pain Knee pain/stretching with end range of motion; not rated; relieved with  repositioning      Mobility  Bed Mobility Bed Mobility: Not assessed (OOB and staying up for lunch) Transfers Transfers: Sit to Stand;Stand to Sit Sit to Stand: 4: Min assist;With upper extremity assist Stand to Sit: 4: Min guard;With upper extremity assist Details for Transfer Assistance: stood to RW; stood x 5 minutes total (including pre-gait/gait) Ambulation/Gait Ambulation/Gait Assistance: 4: Min assist Ambulation Distance (Feet): 2 Feet (forward and backward) Assistive device: Rolling walker Ambulation/Gait Assistance Details: steady assist with vc for sequencing and to maintain TTWB LLE Gait Pattern: Step-to pattern;Decreased stride length    Exercises General Exercises - Lower Extremity Long Arc Quad: AROM;Left;5 reps;Seated Heel Slides: AAROM;Left;5 reps;Seated;Other (comment) (prolonged stretch (up to 1 minute) with ea rep)   PT Diagnosis: Difficulty walking;Acute pain  PT Problem List: Decreased strength;Decreased range of motion;Decreased balance;Decreased mobility;Decreased knowledge of use of DME;Decreased knowledge of precautions;Obesity;Pain PT Treatment Interventions: DME instruction;Gait training;Functional mobility training;Therapeutic exercise;Patient/family education     PT Goals(Current goals can be found in the care plan section) Acute Rehab PT Goals Patient Stated Goal: be able to walk again and go up steps PT Goal Formulation: With patient Time For Goal Achievement: 08/28/12 Potential to Achieve Goals: Good  Visit Information  Last PT Received On: 08/21/12 Assistance Needed: +1 History of Present Illness: 57 y/o male now almost 6 weeks post op from ORIF of left supracondylar femur fracture. He presented to clinic yesterday with swelling of the left lower extremity. Negative for DVT; +cellulitis. He has been at home now for two weeks after spending several weeks at [facility]. He  has been NWB on the L LE. He has been FWB on the R LE and pivoting       Prior  Functioning  Home Living Family/patient expects to be discharged to:: Private residence Living Arrangements: Other relatives Available Help at Discharge: Family;Friend(s);Available 24 hours/day Type of Home: House Home Access: Ramped entrance Home Layout: Two level;Able to live on main level with bedroom/bathroom Home Equipment: Walker - 4 wheels;Bedside commode;Wheelchair - Careers adviser (comment) Prior Function Level of Independence: Independent (prior to injury) Comments: was squat-pivoting to w/c or bsc independently (no device); standing with RW for pericare with assist Communication Communication: No difficulties    Cognition  Cognition Arousal/Alertness: Awake/alert Behavior During Therapy: WFL for tasks assessed/performed Overall Cognitive Status: Within Functional Limits for tasks assessed    Extremity/Trunk Assessment Upper Extremity Assessment Upper Extremity Assessment: Overall WFL for tasks assessed Lower Extremity Assessment Lower Extremity Assessment: LLE deficits/detail LLE Deficits / Details: grossly edematous with erythema of lower leg; bledsoe brace was already unhinged on arrival; AAROM to 60 knee flexion; knee extension strength 3-/5   Balance Static Standing Balance Static Standing - Balance Support: Bilateral upper extremity supported Static Standing - Level of Assistance: 5: Stand by assistance  End of Session PT - End of Session Equipment Utilized During Treatment: Other (comment) (Lt bledsoe brace) Activity Tolerance: Patient tolerated treatment well Patient left: in chair;with call bell/phone within reach  GP     [name] 08/21/2012, 1:39 PM Pager [phone]

## 2012-08-22 DIAGNOSIS — N185 Chronic kidney disease, stage 5: Secondary | ICD-10-CM

## 2012-08-22 LAB — BASIC METABOLIC PANEL
Calcium: 8.9 mg/dL (ref 8.4–10.5)
Creatinine, Ser: 3.59 mg/dL — ABNORMAL HIGH (ref 0.50–1.35)
GFR calc Af Amer: 20 mL/min — ABNORMAL LOW (ref >90)

## 2012-08-22 NOTE — Progress Notes (Addendum)
ANTIBIOTIC CONSULT NOTE - Follow Up  Pharmacy Consult for vancomycin and zosyn Indication: cellulitis  No Known Allergies  Patient Measurements: Height: 6' 0.83" (185 cm) Weight: 266 lb 1.5 oz (120.7 kg) IBW/kg (Calculated) : 79.52  Vital Signs: Temp: 99 F (37.2 C) (07/27 0700) Temp src: Oral (07/27 0700) BP: 131/72 mmHg (07/27 0700) Pulse Rate: 87 (07/27 0700) Intake/Output from previous day: 07/26 0701 - 07/27 0700 In: 960 [P.O.:960] Out: 1625 [Urine:1625] Intake/Output from this shift:    Labs:  Recent Labs  08/19/12 1646 08/19/12 1934 08/20/12 0440 08/21/12 0510  WBC 11.4*  --  9.7 9.5  HGB 12.9*  --  11.4* 11.2*  PLT 179  --  156 154  CREATININE 3.25* 3.17* 3.18* 3.40*   Estimated Creatinine Clearance: 33.3 ml/min (by C-G formula based on Cr of 3.4). No results found for this basename: VANCOTROUGH, VANCOPEAK, VANCORANDOM, GENTTROUGH, GENTPEAK, GENTRANDOM, TOBRATROUGH, TOBRAPEAK, TOBRARND, AMIKACINPEAK, AMIKACINTROU, AMIKACIN,  in the last 72 hours   Microbiology: Recent Results (from the past 720 hour(s))  CULTURE, BLOOD (ROUTINE X 2)     Status: None   Collection Time    08/19/12  5:15 PM      Result Value Range Status   Specimen Description BLOOD ARM RIGHT   Final   Special Requests BOTTLES DRAWN AEROBIC ONLY 7CC   Final   Culture  Setup Time 08/19/2012 22:47   Final   Culture     Final   Value:        BLOOD CULTURE RECEIVED NO GROWTH TO DATE CULTURE WILL BE HELD FOR 5 DAYS BEFORE ISSUING A FINAL NEGATIVE REPORT   Report Status PENDING   Incomplete  CULTURE, BLOOD (SINGLE)     Status: None   Collection Time    08/19/12  7:35 PM      Result Value Range Status   Specimen Description BLOOD ARM LEFT   Final   Special Requests BOTTLES DRAWN AEROBIC AND ANAEROBIC 10CC   Final   Culture  Setup Time 08/20/2012 03:45   Final   Culture     Final   Value:        BLOOD CULTURE RECEIVED NO GROWTH TO DATE CULTURE WILL BE HELD FOR 5 DAYS BEFORE ISSUING A FINAL  NEGATIVE REPORT   Report Status PENDING   Incomplete  WOUND CULTURE     Status: None   Collection Time    08/20/12 10:41 AM      Result Value Range Status   Specimen Description WOUND LEG LEFT   Final   Special Requests Immunocompromised   Final   Gram Stain     Final   Value: NO WBC SEEN     NO SQUAMOUS EPITHELIAL CELLS SEEN     RARE GRAM NEGATIVE RODS   Culture ABUNDANT GRAM NEGATIVE RODS   Final   Report Status PENDING   Incomplete    Medications:  Anti-infectives   Start     Dose/Rate Route Frequency Ordered Stop   08/21/12 1800  vancomycin (VANCOCIN) 1,500 mg in sodium chloride 0.9 % 500 mL IVPB     1,500 mg 250 mL/hr over 120 Minutes Intravenous Every 48 hours 08/19/12 1933     08/20/12 0600  piperacillin-tazobactam (ZOSYN) IVPB 3.375 g     3.375 g 12.5 mL/hr over 240 Minutes Intravenous 3 times per day 08/20/12 0106     08/20/12 0500  ciprofloxacin (CIPRO) IVPB 400 mg  Status:  Discontinued     400 mg 200  mL/hr over 60 Minutes Intravenous Every 24 hours 08/19/12 2350 08/20/12 0105   08/19/12 1700  piperacillin-tazobactam (ZOSYN) IVPB 3.375 g  Status:  Discontinued     3.375 g 12.5 mL/hr over 240 Minutes Intravenous  Once 08/19/12 1646 08/19/12 1647   08/19/12 1700  piperacillin-tazobactam (ZOSYN) IVPB 3.375 g     3.375 g 100 mL/hr over 30 Minutes Intravenous  Once 08/19/12 1648 08/19/12 2024   08/19/12 1700  vancomycin (VANCOCIN) 2,500 mg in sodium chloride 0.9 % 500 mL IVPB     2,500 mg 250 mL/hr over 120 Minutes Intravenous To Emergency Dept 08/19/12 1650 08/19/12 1949     Assessment: 56 yom presented to the ED with LLE swelling and pain, admitted for cellulitis (no evidence of osteomyelitis).  Of note, patient is s/p renal transplant 1996, now with chronic kidney disease.  Current SCr is 3.4> CrCl ~33 ml/min (baseline SCr ~3?).  Patient has history of high troughs on vancomycin.  WBC 9.5, Tm 99.  UOP ok at 0.4 ml/kg/hr.  Wound culture reveals abundant GNR.     Vanc 7/24>> Zosyn  7/24 x1, then 7/25>>  7/25 wound cx - abundant GNR 7/24 blood cx (single) - NGTD  Goal of Therapy:  Vancomycin trough level 10-15 mcg/ml  Plan:  - Continue Vancomycin 1500mg  IV every 48 hours - Trough at Css, sooner if clinically indicated - Zosyn 3.375G IV q8h to be infused over 4 hours - Follow up SCr, UOP, cultures, clinical course and adjust as clinically indicated  Roshelle Traub L. Illene Bolus, PharmD, BCPS Clinical Pharmacist Pager: [phone] Pharmacy: [phone] 08/22/2012 9:00 AM

## 2012-08-22 NOTE — Progress Notes (Signed)
   Subjective: Hospital day - 3 Patient reports pain as mild.   Patient seen in rounds with Dr. Lequita Halt.   Patient is well, and has had no acute complaints or problems Minimal drainage again today.   Objective: Vital signs in last 24 hours: Temp:  [98.1 F (36.7 C)-99 F (37.2 C)] 99 F (37.2 C) (07/27 0700) Pulse Rate:  [67-87] 87 (07/27 0700) Resp:  [18-20] 18 (07/27 0700) BP: (119-133)/(70-84) 131/72 mmHg (07/27 0700) SpO2:  [88 %-93 %] 91 % (07/27 0700)  Intake/Output from previous day:  Intake/Output Summary (Last 24 hours) at 08/22/12 0802 Last data filed at 08/22/12 0700  Gross per 24 hour  Intake    960 ml  Output   1625 ml  Net   -665 ml    Intake/Output this shift:    Labs:  Recent Labs  08/19/12 1646 08/20/12 0440 08/21/12 0510  HGB 12.9* 11.4* 11.2*    Recent Labs  08/20/12 0440 08/21/12 0510  WBC 9.7 9.5  RBC 4.30 4.11*  HCT 38.8* 37.1*  PLT 156 154    Recent Labs  08/20/12 0440 08/21/12 0510  NA 139 138  K 3.5 3.8  CL 95* 96  CO2 30 31  BUN 53* 53*  CREATININE 3.18* 3.40*  GLUCOSE 101* 110*  CALCIUM 9.1 8.7   No results found for this basename: LABPT, INR,  in the last 72 hours  EXAM General - Patient is Alert, Appropriate and Oriented Extremity - Neurovascular intact Sensation intact distally Dorsiflexion/Plantar flexion intact Dressing - scant drainage Motor Function - intact, moving foot and toes well on exam.   Past Medical History  Diagnosis Date  . Hypertension   . Hepatitis C 1996  . CKD (chronic kidney disease), stage IV     a. s/p renal transplant 1996.  Marland Kitchen Pneumonia   . HLD (hyperlipidemia)   . Nonischemic cardiomyopathy     a. unknown etiology, EF 30-35% by echo 09/2011;  b. 09/2011 Lexi MV EF 42%, no ischemia/infarct.  . Systolic CHF   . Atrial flutter     a. in the setting of sepsis 05/2011; on amio; no anticoagulation    Assessment/Plan: Hospital day - 3 Principal Problem:   Cellulitis of leg,  left Active Problems:   Chronic systolic CHF (congestive heart failure)   CKD (chronic kidney disease)   PAF (paroxysmal atrial fibrillation)   History of renal transplant  Estimated body mass index is 35.27 kg/(m^2) as calculated from the following:   Height as of this encounter: 6' 0.84" (1.85 m).   Weight as of this encounter: 120.7 kg (266 lb 1.5 oz).  Continue ABX  Continue to monitor thigh and wound  PERKINS, ALEXZANDREW 08/22/2012, 8:02 AM

## 2012-08-22 NOTE — Progress Notes (Signed)
[name] PROGRESS NOTE  RYOMA NOFZIGER EAV:[id number] DOB: [birth year] DOA: 08/19/2012 PCP: [name], MD  Assessment/Plan: Cellulitis of the left lower extremity  - continue with empiric antibiotics.  - Afebrile, no leukocytosis  - No xray findings of osteo. Left thigh wound status post recent ORIF  - Orho following for chronic wound drainage from the surgical site. - Ortho recs for cont abx Chronic kidney disease  - continue present medications. Check appears at baseline. Cont to monitor closely  CHF/nonischemic cardiomyopathy  - appears compensated and continue present medication. Closely follow intake output.  Paroxysmal atrial fibrillation - rate controlled. Continue present medication. Patient is on aspirin.  Code Status: Full Family Communication: Pt in room (indicate person spoken with, relationship, and if by phone, the number) Disposition Plan: pending   Consultants: Orthopedic Surgery  Antibiotics: Vanc 08/19/12>>>  Zosyn 08/19/12>>>  HPI/Subjective: No acute events noted overnight  Objective: Filed Vitals:   08/21/12 0514 08/21/12 1405 08/21/12 2224 08/22/12 0700  BP: 126/70 119/70 133/84 131/72  Pulse: 83 67 84 87  Temp: 98.6 F (37 C) 98.1 F (36.7 C) 98.2 F (36.8 C) 99 F (37.2 C)  TempSrc: Oral Oral Oral Oral  Resp: 18 20 20 18   Height:      Weight:      SpO2: 90% 93% 88% 91%    Intake/Output Summary (Last 24 hours) at 08/22/12 1416 Last data filed at 08/22/12 1340  Gross per 24 hour  Intake    480 ml  Output   1475 ml  Net   -995 ml   Filed Weights   08/19/12 1915 08/20/12 0658  Weight: 129.7 kg (285 lb 15 oz) 120.7 kg (266 lb 1.5 oz)    Exam:   General:  Awake, in nad  Cardiovascular: regular, s1, s2  Respiratory: normal resp effort, no wheezing  Abdomen: soft, nondistended  Musculoskeletal: perfused, no clubbing   Data Reviewed: Basic Metabolic Panel:  Recent Labs Lab 08/19/12 1646 08/19/12 1934  08/20/12 0440 08/21/12 0510 08/22/12 0852  NA 134* 137 139 138 137  K HEMOLYZED SPECIMEN, RESULTS MAY BE AFFECTED 3.7 3.5 3.8 3.6  CL 93* 96 95* 96 96  CO2 30 31 30 31 29   GLUCOSE 126* 109* 101* 110* 102*  BUN 56* 54* 53* 53* 49*  CREATININE 3.25* 3.17* 3.18* 3.40* 3.59*  CALCIUM 9.5 9.2 9.1 8.7 8.9   Liver Function Tests:  Recent Labs Lab 08/20/12 0440 08/21/12 0510  AST 30 21  ALT 11 10  ALKPHOS 64 60  BILITOT 0.4 0.4  PROT 6.9 6.6  ALBUMIN 2.6* 2.6*   No results found for this basename: LIPASE, AMYLASE,  in the last 168 hours No results found for this basename: AMMONIA,  in the last 168 hours CBC:  Recent Labs Lab 08/19/12 1646 08/20/12 0440 08/21/12 0510  WBC 11.4* 9.7 9.5  NEUTROABS 9.5* 6.9 6.9  HGB 12.9* 11.4* 11.2*  HCT 41.9 38.8* 37.1*  MCV 88.2 90.2 90.3  PLT 179 156 154   Cardiac Enzymes: No results found for this basename: CKTOTAL, CKMB, CKMBINDEX, TROPONINI,  in the last 168 hours BNP (last 3 results)  Recent Labs  12/30/11 1057 03/12/12 0449 03/15/12 0440  PROBNP 534.0* 14215.0* 30340.0*   CBG: No results found for this basename: GLUCAP,  in the last 168 hours  Recent Results (from the past 240 hour(s))  CULTURE, BLOOD (ROUTINE X 2)     Status: None   Collection Time    08/19/12  5:15 PM      Result Value Range Status   Specimen Description BLOOD ARM RIGHT   Final   Special Requests BOTTLES DRAWN AEROBIC ONLY [facility]   Final   Culture  Setup Time 08/19/2012 22:47   Final   Culture     Final   Value:        BLOOD CULTURE RECEIVED NO GROWTH TO DATE CULTURE WILL BE HELD FOR 5 DAYS BEFORE ISSUING A FINAL NEGATIVE REPORT   Report Status PENDING   Incomplete  CULTURE, BLOOD (SINGLE)     Status: None   Collection Time    08/19/12  7:35 PM      Result Value Range Status   Specimen Description BLOOD ARM LEFT   Final   Special Requests BOTTLES DRAWN AEROBIC AND ANAEROBIC 10CC   Final   Culture  Setup Time 08/20/2012 03:45   Final   Culture      Final   Value:        BLOOD CULTURE RECEIVED NO GROWTH TO DATE CULTURE WILL BE HELD FOR 5 DAYS BEFORE ISSUING A FINAL NEGATIVE REPORT   Report Status PENDING   Incomplete  WOUND CULTURE     Status: None   Collection Time    08/20/12 10:41 AM      Result Value Range Status   Specimen Description WOUND LEG LEFT   Final   Special Requests Immunocompromised   Final   Gram Stain     Final   Value: NO WBC SEEN     NO SQUAMOUS EPITHELIAL CELLS SEEN     RARE GRAM NEGATIVE RODS   Culture ABUNDANT GRAM NEGATIVE RODS   Final   Report Status PENDING   Incomplete     Studies: No results found.  Scheduled Meds: . allopurinol  100 mg Oral BID  . amiodarone  200 mg Oral Daily  . aspirin EC  81 mg Oral Daily  . carvedilol  25 mg Oral BID WC  . furosemide  100 mg Oral BID  . heparin  5,000 Units Subcutaneous Q8H  . hydrALAZINE  50 mg Oral Q8H  . isosorbide mononitrate  60 mg Oral Daily  . pantoprazole  40 mg Oral Daily  . piperacillin-tazobactam (ZOSYN)  IV  3.375 g Intravenous Q8H  . potassium chloride SA  20 mEq Oral Daily  . predniSONE  10 mg Oral Q breakfast  . simvastatin  20 mg Oral q1800  . sirolimus  1.2 mg Oral Daily  . sodium chloride  3 mL Intravenous Q12H  . vancomycin  1,500 mg Intravenous Q48H   Continuous Infusions:   Principal Problem:   Cellulitis of leg, left Active Problems:   Chronic systolic CHF (congestive heart failure)   CKD (chronic kidney disease)   PAF (paroxysmal atrial fibrillation)   History of renal transplant    Time spent:    CHIU, STEPHEN K  [name] Pager [phone]. If 7PM-7AM, please contact night-coverage at [email or web address], password [facility] 08/22/2012, 2:16 PM  LOS: 3 days

## 2012-08-23 LAB — BASIC METABOLIC PANEL
Calcium: 8.9 mg/dL (ref 8.4–10.5)
GFR calc non Af Amer: 17 mL/min — ABNORMAL LOW (ref >90)
Glucose, Bld: 96 mg/dL (ref 70–99)
Sodium: 139 mEq/L (ref 135–145)

## 2012-08-23 MED ORDER — SODIUM CHLORIDE 0.9 % IV SOLN
INTRAVENOUS | Status: DC
  Administered 2012-08-23: 23:00:00 via INTRAVENOUS

## 2012-08-23 NOTE — Progress Notes (Signed)
[name] PROGRESS NOTE  Chad [name] AVW:[id number] DOB: 1955-01-31 DOA: 08/19/2012 PCP: [name], MD  Assessment/Plan: Cellulitis of the left lower extremity  - continue with empiric antibiotics.  - Afebrile, no leukocytosis  - No xray findings of osteo. Left thigh wound status post recent ORIF  - Orho following for chronic wound drainage from the surgical site.  - Ortho recs for cont abx  - Pt is slowly improving Chronic kidney disease  - continue present medications. Check appears at baseline. Cont to monitor closely  CHF/nonischemic cardiomyopathy  - remains compensated - continue current meds Paroxysmal atrial fibrillation  - rate controlled - Continue current meds  - Cont aspirin.  Code Status: Full Family Communication: Pt in room (indicate person spoken with, relationship, and if by phone, the number) Disposition Plan: Pending   Consultants:  Orthopedic Surgery  Antibiotics: Vanc 08/19/12>>>  Zosyn 08/19/12>>>  HPI/Subjective: No acute events  Objective: Filed Vitals:   08/22/12 0700 08/22/12 1436 08/22/12 2127 08/23/12 0502  BP: 131/72 143/76 146/73 151/91  Pulse: 87 77 83 85  Temp: 99 F (37.2 C) 98.7 F (37.1 C) 98.8 F (37.1 C) 98.8 F (37.1 C)  TempSrc: Oral Oral Oral Oral  Resp: 18 18 16 18   Height:      Weight:      SpO2: 91% 90% 94% 90%    Intake/Output Summary (Last 24 hours) at 08/23/12 1412 Last data filed at 08/23/12 0942  Gross per 24 hour  Intake    840 ml  Output    825 ml  Net     15 ml   Filed Weights   08/19/12 1915 08/20/12 0658  Weight: 129.7 kg (285 lb 15 oz) 120.7 kg (266 lb 1.5 oz)    Exam:   General:  Awake, in nad  Cardiovascular: regular, s1, s2  Respiratory: normal resp effort, no wheezing  Abdomen: soft, nondistended  Musculoskeletal: perfused, no clubbing   Data Reviewed: Basic Metabolic Panel:  Recent Labs Lab 08/19/12 1934 08/20/12 0440 08/21/12 0510 08/22/12 0852  08/23/12 0540  NA 137 139 138 137 139  K 3.7 3.5 3.8 3.6 3.5  CL 96 95* 96 96 97  CO2 31 30 31 29 29   GLUCOSE 109* 101* 110* 102* 96  BUN 54* 53* 53* 49* 50*  CREATININE 3.17* 3.18* 3.40* 3.59* 3.63*  CALCIUM 9.2 9.1 8.7 8.9 8.9   Liver Function Tests:  Recent Labs Lab 08/20/12 0440 08/21/12 0510  AST 30 21  ALT 11 10  ALKPHOS 64 60  BILITOT 0.4 0.4  PROT 6.9 6.6  ALBUMIN 2.6* 2.6*   No results found for this basename: LIPASE, AMYLASE,  in the last 168 hours No results found for this basename: AMMONIA,  in the last 168 hours CBC:  Recent Labs Lab 08/19/12 1646 08/20/12 0440 08/21/12 0510  WBC 11.4* 9.7 9.5  NEUTROABS 9.5* 6.9 6.9  HGB 12.9* 11.4* 11.2*  HCT 41.9 38.8* 37.1*  MCV 88.2 90.2 90.3  PLT 179 156 154   Cardiac Enzymes: No results found for this basename: CKTOTAL, CKMB, CKMBINDEX, TROPONINI,  in the last 168 hours BNP (last 3 results)  Recent Labs  12/30/11 1057 03/12/12 0449 03/15/12 0440  PROBNP 534.0* 14215.0* 30340.0*   CBG: No results found for this basename: GLUCAP,  in the last 168 hours  Recent Results (from the past 240 hour(s))  CULTURE, BLOOD (ROUTINE X 2)     Status: None   Collection Time    08/19/12  5:15 PM      Result Value Range Status   Specimen Description BLOOD ARM RIGHT   Final   Special Requests BOTTLES DRAWN AEROBIC ONLY [facility]   Final   Culture  Setup Time 08/19/2012 22:47   Final   Culture     Final   Value:        BLOOD CULTURE RECEIVED NO GROWTH TO DATE CULTURE WILL BE HELD FOR 5 DAYS BEFORE ISSUING A FINAL NEGATIVE REPORT   Report Status PENDING   Incomplete  CULTURE, BLOOD (SINGLE)     Status: None   Collection Time    08/19/12  7:35 PM      Result Value Range Status   Specimen Description BLOOD ARM LEFT   Final   Special Requests BOTTLES DRAWN AEROBIC AND ANAEROBIC 10CC   Final   Culture  Setup Time 08/20/2012 03:45   Final   Culture     Final   Value:        BLOOD CULTURE RECEIVED NO GROWTH TO DATE CULTURE  WILL BE HELD FOR 5 DAYS BEFORE ISSUING A FINAL NEGATIVE REPORT   Report Status PENDING   Incomplete  WOUND CULTURE     Status: None   Collection Time    08/20/12 10:41 AM      Result Value Range Status   Specimen Description WOUND LEG LEFT   Final   Special Requests Immunocompromised   Final   Gram Stain     Final   Value: NO WBC SEEN     NO SQUAMOUS EPITHELIAL CELLS SEEN     RARE GRAM NEGATIVE RODS   Culture     Final   Value: ABUNDANT PROTEUS MIRABILIS     MODERATE ENTEROCOCCUS SPECIES   Report Status PENDING   Incomplete   Organism ID, Bacteria PROTEUS MIRABILIS   Final     Studies: No results found.  Scheduled Meds: . allopurinol  100 mg Oral BID  . amiodarone  200 mg Oral Daily  . aspirin EC  81 mg Oral Daily  . carvedilol  25 mg Oral BID WC  . furosemide  100 mg Oral BID  . heparin  5,000 Units Subcutaneous Q8H  . hydrALAZINE  50 mg Oral Q8H  . isosorbide mononitrate  60 mg Oral Daily  . pantoprazole  40 mg Oral Daily  . piperacillin-tazobactam (ZOSYN)  IV  3.375 g Intravenous Q8H  . potassium chloride SA  20 mEq Oral Daily  . predniSONE  10 mg Oral Q breakfast  . simvastatin  20 mg Oral q1800  . sirolimus  1.2 mg Oral Daily  . sodium chloride  3 mL Intravenous Q12H  . vancomycin  1,500 mg Intravenous Q48H   Continuous Infusions:   Principal Problem:   Cellulitis of leg, left Active Problems:   Chronic systolic CHF (congestive heart failure)   CKD (chronic kidney disease)   PAF (paroxysmal atrial fibrillation)   History of renal transplant    Time spent:    [name] K  [name] Pager [phone]. If 7PM-7AM, please contact night-coverage at [email or web address], password [facility] 08/23/2012, 2:12 PM  LOS: 4 days

## 2012-08-23 NOTE — Progress Notes (Signed)
Subjective: Pt denies any pain in the left knee / distal thigh area.  No n/v.  TOlerating a regular diet.  Objective: Vital signs in last 24 hours: Temp:  [97.9 F (36.6 C)-98.8 F (37.1 C)] 97.9 F (36.6 C) 2022-08-31 1443) Pulse Rate:  [75-85] 75 08/31/2022 1443) Resp:  [18-20] 20 08/31/2022 1443) BP: (133-151)/(69-91) 133/69 mmHg 08/31/22 1443) SpO2:  [90 %-91 %] 91 % 2022/08/31 1443)  Intake/Output from previous day: 07/27 0701 - 08-31-2022 0700 In: 960 [P.O.:960] Out: 550 [Urine:550] Intake/Output this shift: Total I/O In: 3 [I.V.:3] Out: -    Recent Labs  08/21/12 0510  HGB 11.2*    Recent Labs  08/21/12 0510  WBC 9.5  RBC 4.11*  HCT 37.1*  PLT 154    Recent Labs  08/22/12 0852 08/30/12 0540  NA 137 139  K 3.6 3.5  CL 96 97  CO2 29 29  BUN 49* 50*  CREATININE 3.59* 3.63*  GLUCOSE 102* 96  CALCIUM 8.9 8.9   No results found for this basename: LABPT, INR,  in the last 72 hours  PE:  L thigh still swollen.  No erythema, warmth or excess tenderness.  Distal lateral wound with serous drainage.  Wound probes several cm deep.  L leg quite swollen and erythematous.  Assessment/Plan: L thigh wound - given continued drainage and depth of wound despite IV abx for several days, I believe it's necessary to take the pt to the oR for I and D of the wound.  He may need a wound vac afterwards depending on the wound appearance.  The risks and benefits of the alternative treatment options have been discussed in detail.  The patient wishes to proceed with surgery and specifically understands risks of bleeding, infection, nerve damage, blood clots, need for additional surgery, amputation and death.    Toni Arthurs 08-30-2012, 9:54 PM

## 2012-08-23 NOTE — Progress Notes (Signed)
Physical Therapy Treatment Patient Details Name: Chad [name] MRN: [id number] DOB: [birth year] Today's Date: 08/23/2012 Time: 4098-1191 PT Time Calculation (min): 24 min  PT Assessment / Plan / Recommendation  History of Present Illness 57 y/o male now almost 6 weeks post op from ORIF of left supracondylar femur fracture. He presented to clinic yesterday with swelling of the left lower extremity. Negative for DVT; +cellulitis. He has been at home now for two weeks after spending several weeks at [facility]. He has been NWB on the L LE. He has been FWB on the R LE and pivoting   Clinical Impression Pt slowly progressing with therapy today. Able to increase amb distance. Pt required setup and stand by (A) while performing ADLs at [facility]. Pt primary complaint today is recurrent diarrhea. Pt cont to require vc's to maintain TTWB status. Pt planned for OR tomorrow (08/23/12). Will cont to f/u with pt to maximize functional mobility. Pt hopes to D/C home this week.    PT Comments   Pt continuing to benefit from skilled PT; will cont to f/u with pt.   Follow Up Recommendations  Home health PT;Supervision for mobility/OOB     Does the patient have the potential to tolerate intense rehabilitation     Barriers to Discharge        Equipment Recommendations  Rolling walker with 5" wheels    Recommendations for Other Services    Frequency Min 3X/week   Progress towards PT Goals Progress towards PT goals: Progressing toward goals  Plan Current plan remains appropriate    Precautions / Restrictions Precautions Precautions: Fall Required Braces or Orthoses: Other Brace/Splint Other Brace/Splint: Bledsoe Brace LLE Restrictions Weight Bearing Restrictions: Yes LLE Weight Bearing: Touchdown weight bearing Other Position/Activity Restrictions: ROM to lt knee as tolerated   Pertinent Vitals/Pain States "i just had a quick pain in lt lower leg while we were walking. None since."     Mobility  Bed  Mobility Bed Mobility: Supine to Sit Supine to Sit: 5: Supervision Details for Bed Mobility Assistance: min cues for sequencing; HOB elevated >30degrees; pt required increased time and handrails also for supine to sit transfer; pt able to use bil UEs to (A) L LE to/off EOB Transfers Transfers: Sit to Stand;Stand to Sit;Stand Pivot Transfers Sit to Stand: 4: Min guard;From bed;With upper extremity assist Stand to Sit: 4: Min guard;To chair/3-in-1;With armrests;With upper extremity assist Stand Pivot Transfers: 4: Min guard;From elevated surface Details for Transfer Assistance: performed sit to stand x 2; pt required min guard (A) for safety; SPT to [facility] due to urgency to have BM; min vc's to maintain TTWB status and for hand placement  Ambulation/Gait Ambulation/Gait Assistance: 4: Min assist Ambulation Distance (Feet): 5 Feet Assistive device: Rolling walker Ambulation/Gait Assistance Details: vc's for gt sequencing  and to maintain TTWB status with gt; pt requires min (A) to manage RW primarily with turning to sit in wheelchair; requires increased time due to pain; c/o pain in posterior Lt lower LE; performed standing AP's to stretch lower leg Gait Pattern: Step-to pattern;Decreased stride length Gait velocity: very decreased Stairs: No Wheelchair Mobility Wheelchair Mobility: Yes Wheelchair Assistance: 6: Modified independent (Device/Increase time) Wheelchair Parts Management: Independent;Other (comment) (pt able to instruct proper setup of wheelchair )    Exercises General Exercises - Lower Extremity Ankle Circles/Pumps: 15 reps;Standing;AROM   PT Diagnosis:    PT Problem List:   PT Treatment Interventions:     PT Goals (current goals can now be found in  the care plan section) Acute Rehab PT Goals Patient Stated Goal: be able to walk again and go up steps PT Goal Formulation: With patient Time For Goal Achievement: 08/28/12 Potential to Achieve Goals: Good  Visit Information   Last PT Received On: 08/23/12 Assistance Needed: +1 History of Present Illness: 57 y/o male now almost 6 weeks post op from ORIF of left supracondylar femur fracture. He presented to clinic yesterday with swelling of the left lower extremity. Negative for DVT; +cellulitis. He has been at home now for two weeks after spending several weeks at [facility]. He has been NWB on the L LE. He has been FWB on the R LE and pivoting    Subjective Data  Subjective: pt lying supine; states 'ive had diarrhea all day. now i need to go again" agreeable to [facility] transfer and therapy  Patient Stated Goal: be able to walk again and go up steps   Cognition  Cognition Arousal/Alertness: Awake/alert Behavior During Therapy: WFL for tasks assessed/performed Overall Cognitive Status: Within Functional Limits for tasks assessed    Balance  Balance Balance Assessed: Yes Static Standing Balance Static Standing - Balance Support: Bilateral upper extremity supported;During functional activity Static Standing - Level of Assistance: 5: Stand by assistance Static Standing - Comment/# of Minutes: stand by (A) for static standing activities at [facility]; tolerated standing ~3 min   End of Session PT - End of Session Equipment Utilized During Treatment: Gait belt;Other (comment) (Bledsoe brace) Activity Tolerance: Patient tolerated treatment well Patient left: in chair;with call bell/phone within reach;Other (comment) (setup for bird bath) Nurse Communication: Mobility status   GP     Donell Sievert, [place] [phone] 08/23/2012, 10:23 AM

## 2012-08-24 ENCOUNTER — Inpatient Hospital Stay (HOSPITAL_COMMUNITY): Payer: Medicaid Other | Admitting: Anesthesiology

## 2012-08-24 ENCOUNTER — Encounter (HOSPITAL_COMMUNITY): Payer: Self-pay | Admitting: Anesthesiology

## 2012-08-24 ENCOUNTER — Encounter (HOSPITAL_COMMUNITY)
Admission: EM | Disposition: A | Discharge status: Home / Self Care | Payer: Self-pay | Source: Home / Self Care | Attending: Internal Medicine

## 2012-08-24 HISTORY — PX: I & D EXTREMITY: SHX5045

## 2012-08-24 LAB — BASIC METABOLIC PANEL
BUN: 51 mg/dL — ABNORMAL HIGH (ref 6–23)
CO2: 30 mEq/L (ref 19–32)
Calcium: 8.8 mg/dL (ref 8.4–10.5)
GFR calc non Af Amer: 18 mL/min — ABNORMAL LOW (ref >90)
Glucose, Bld: 92 mg/dL (ref 70–99)
Potassium: 3.5 mEq/L (ref 3.5–5.1)
Sodium: 139 mEq/L (ref 135–145)

## 2012-08-24 LAB — SURGICAL PCR SCREEN: Staphylococcus aureus: NEGATIVE

## 2012-08-24 LAB — WOUND CULTURE: Gram Stain: NONE SEEN

## 2012-08-24 SURGERY — IRRIGATION AND DEBRIDEMENT EXTREMITY
Anesthesia: General | Site: Leg Lower | Laterality: Left | Wound class: Dirty or Infected

## 2012-08-24 MED ORDER — HYDROMORPHONE HCL PF 1 MG/ML IJ SOLN
0.2500 mg | INTRAMUSCULAR | Status: DC | PRN
  Administered 2012-08-24 – 2012-08-28 (×3): 0.5 mg via INTRAVENOUS
  Filled 2012-08-24: qty 1

## 2012-08-24 MED ORDER — MEPERIDINE HCL 25 MG/ML IJ SOLN: 6.2500 mg | INTRAMUSCULAR | Status: DC | PRN

## 2012-08-24 MED ORDER — FENTANYL CITRATE 0.05 MG/ML IJ SOLN
INTRAMUSCULAR | Status: DC | PRN
  Administered 2012-08-24 (×5): 50 ug via INTRAVENOUS

## 2012-08-24 MED ORDER — LACTATED RINGERS IV SOLN
INTRAVENOUS | Status: DC
  Administered 2012-08-24: 14:00:00 via INTRAVENOUS

## 2012-08-24 MED ORDER — PROMETHAZINE HCL 25 MG/ML IJ SOLN: 6.2500 mg | INTRAMUSCULAR | Status: DC | PRN

## 2012-08-24 MED ORDER — LACTATED RINGERS IV SOLN
INTRAVENOUS | Status: DC | PRN
  Administered 2012-08-24 (×2): via INTRAVENOUS

## 2012-08-24 MED ORDER — ACETAMINOPHEN 10 MG/ML IV SOLN
INTRAVENOUS | Status: AC
  Administered 2012-08-24: 1000 mg via INTRAVENOUS
  Filled 2012-08-24: qty 100

## 2012-08-24 MED ORDER — HYDROMORPHONE HCL PF 1 MG/ML IJ SOLN
INTRAMUSCULAR | Status: AC
  Administered 2012-08-24: 0.5 mg via INTRAVENOUS
  Filled 2012-08-24: qty 1

## 2012-08-24 MED ORDER — ACETAMINOPHEN 10 MG/ML IV SOLN: 1000.0000 mg | Freq: Once | INTRAVENOUS | Status: AC | PRN

## 2012-08-24 MED ORDER — SODIUM CHLORIDE 0.9 % IV SOLN
INTRAVENOUS | Status: DC
  Administered 2012-08-24 – 2012-08-25 (×2): via INTRAVENOUS

## 2012-08-24 MED ORDER — PHENYLEPHRINE HCL 10 MG/ML IJ SOLN
INTRAMUSCULAR | Status: DC | PRN
  Administered 2012-08-24: 80 ug via INTRAVENOUS
  Administered 2012-08-24: 40 ug via INTRAVENOUS
  Administered 2012-08-24: 80 ug via INTRAVENOUS
  Administered 2012-08-24: 40 ug via INTRAVENOUS
  Administered 2012-08-24: 80 ug via INTRAVENOUS

## 2012-08-24 MED ORDER — OXYCODONE HCL 5 MG PO TABS
5.0000 mg | ORAL_TABLET | Freq: Once | ORAL | Status: AC | PRN
  Filled 2012-08-24: qty 2

## 2012-08-24 MED ORDER — PROPOFOL 10 MG/ML IV BOLUS
INTRAVENOUS | Status: DC | PRN
  Administered 2012-08-24: 200 mg via INTRAVENOUS

## 2012-08-24 MED ORDER — MIDAZOLAM HCL 5 MG/5ML IJ SOLN
INTRAMUSCULAR | Status: DC | PRN
  Administered 2012-08-24: 2 mg via INTRAVENOUS

## 2012-08-24 MED ORDER — LIDOCAINE HCL (CARDIAC) 20 MG/ML IV SOLN
INTRAVENOUS | Status: DC | PRN
  Administered 2012-08-24: 80 mg via INTRAVENOUS

## 2012-08-24 MED ORDER — ONDANSETRON HCL 4 MG/2ML IJ SOLN
INTRAMUSCULAR | Status: DC | PRN
  Administered 2012-08-24: 4 mg via INTRAVENOUS

## 2012-08-24 MED ORDER — OXYCODONE HCL 5 MG/5ML PO SOLN: 5.0000 mg | Freq: Once | ORAL | Status: AC | PRN

## 2012-08-24 SURGICAL SUPPLY — 71 items
BANDAGE CONFORM 3  STR LF (GAUZE/BANDAGES/DRESSINGS) ×2 IMPLANT
BANDAGE ELASTIC 4 VELCRO ST LF (GAUZE/BANDAGES/DRESSINGS) ×2 IMPLANT
BANDAGE ELASTIC 6 VELCRO ST LF (GAUZE/BANDAGES/DRESSINGS) ×1 IMPLANT
BANDAGE ESMARK 6X9 LF (GAUZE/BANDAGES/DRESSINGS) ×1 IMPLANT
BLADE SURG 10 STRL SS (BLADE) ×2 IMPLANT
BNDG CMPR 9X6 STRL LF SNTH (GAUZE/BANDAGES/DRESSINGS)
BNDG COHESIVE 4X5 TAN STRL (GAUZE/BANDAGES/DRESSINGS) ×3 IMPLANT
BNDG COHESIVE 6X5 TAN STRL LF (GAUZE/BANDAGES/DRESSINGS) ×1 IMPLANT
BNDG ESMARK 6X9 LF (GAUZE/BANDAGES/DRESSINGS)
CANISTER WOUND CARE 500ML ATS (WOUND CARE) ×1 IMPLANT
CHLORAPREP W/TINT 26ML (MISCELLANEOUS) ×2 IMPLANT
CLOTH BEACON ORANGE TIMEOUT ST (SAFETY) ×2 IMPLANT
COVER SURGICAL LIGHT HANDLE (MISCELLANEOUS) ×2 IMPLANT
CUFF TOURNIQUET SINGLE 34IN LL (TOURNIQUET CUFF) ×1 IMPLANT
CUFF TOURNIQUET SINGLE 44IN (TOURNIQUET CUFF) IMPLANT
DRAIN CHANNEL 10M FLAT 3/4 FLT (DRAIN) ×2 IMPLANT
DRAIN JACKSON PRATT 10MM FLAT (MISCELLANEOUS) ×1 IMPLANT
DRAIN PENROSE 1/2X12 LTX STRL (WOUND CARE) IMPLANT
DRAPE ORTHO SPLIT 77X108 STRL (DRAPES) ×4
DRAPE SURG ORHT 6 SPLT 77X108 (DRAPES) IMPLANT
DRAPE U-SHAPE 47X51 STRL (DRAPES) ×2 IMPLANT
DRSG ADAPTIC 3X8 NADH LF (GAUZE/BANDAGES/DRESSINGS) ×2 IMPLANT
DRSG MEPITEL 3X4 ME34 (GAUZE/BANDAGES/DRESSINGS) ×1 IMPLANT
DRSG PAD ABDOMINAL 8X10 ST (GAUZE/BANDAGES/DRESSINGS) ×1 IMPLANT
ELECT REM PT RETURN 9FT ADLT (ELECTROSURGICAL) ×2
ELECTRODE REM PT RTRN 9FT ADLT (ELECTROSURGICAL) ×1 IMPLANT
EVACUATOR SILICONE 100CC (DRAIN) ×2 IMPLANT
GLOVE BIO SURGEON STRL SZ8 (GLOVE) ×2 IMPLANT
GLOVE BIOGEL PI IND STRL 8 (GLOVE) ×1 IMPLANT
GLOVE BIOGEL PI INDICATOR 8 (GLOVE) ×1
GOWN PREVENTION PLUS XLARGE (GOWN DISPOSABLE) ×2 IMPLANT
GOWN STRL NON-REIN LRG LVL3 (GOWN DISPOSABLE) ×4 IMPLANT
HANDPIECE INTERPULSE COAX TIP (DISPOSABLE) ×2
IMMOBILIZER KNEE 24 THIGH 36 (MISCELLANEOUS) IMPLANT
IMMOBILIZER KNEE 24 UNIV (MISCELLANEOUS) ×2
KIT BASIN OR (CUSTOM PROCEDURE TRAY) ×2 IMPLANT
KIT ROOM TURNOVER OR (KITS) ×2 IMPLANT
MANIFOLD NEPTUNE II (INSTRUMENTS) ×2 IMPLANT
NS IRRIG 1000ML POUR BTL (IV SOLUTION) ×2 IMPLANT
PACK ORTHO EXTREMITY (CUSTOM PROCEDURE TRAY) ×2 IMPLANT
PAD ARMBOARD 7.5X6 YLW CONV (MISCELLANEOUS) ×4 IMPLANT
PAD CAST 4YDX4 CTTN HI CHSV (CAST SUPPLIES) ×1 IMPLANT
PADDING CAST COTTON 4X4 STRL (CAST SUPPLIES) ×2
PADDING CAST COTTON 6X4 STRL (CAST SUPPLIES) ×1 IMPLANT
SET CYSTO W/LG BORE CLAMP LF (SET/KITS/TRAYS/PACK) ×1 IMPLANT
SET HNDPC FAN SPRY TIP SCT (DISPOSABLE) IMPLANT
SOAP 2 % CHG 4 OZ (WOUND CARE) ×2 IMPLANT
SPONGE ABDOMINAL VAC ABTHERA (MISCELLANEOUS) ×1 IMPLANT
SPONGE GAUZE 4X4 12PLY (GAUZE/BANDAGES/DRESSINGS) ×1 IMPLANT
SPONGE LAP 18X18 X RAY DECT (DISPOSABLE) ×2 IMPLANT
STAPLER VISISTAT 35W (STAPLE) IMPLANT
STOCKINETTE IMPERVIOUS LG (DRAPES) ×1 IMPLANT
SUCTION FRAZIER TIP 10 FR DISP (SUCTIONS) ×2 IMPLANT
SUT ETHILON 2 0 PSLX (SUTURE) ×2 IMPLANT
SUT ETHILON 3 0 FSL (SUTURE) ×2 IMPLANT
SUT PDS AB 1 CT  36 (SUTURE) ×2
SUT PDS AB 1 CT 36 (SUTURE) IMPLANT
SUT PDS AB 2-0 CT1 27 (SUTURE) ×2 IMPLANT
SUT PROLENE 3 0 PS 2 (SUTURE) ×1 IMPLANT
SUT VIC AB 2-0 CT1 27 (SUTURE) ×4
SUT VIC AB 2-0 CT1 TAPERPNT 27 (SUTURE) ×2 IMPLANT
SUT VIC AB 3-0 PS2 18 (SUTURE) ×2
SUT VIC AB 3-0 PS2 18XBRD (SUTURE) ×1 IMPLANT
SWAB CULTURE LIQUID MINI MALE (MISCELLANEOUS) ×1 IMPLANT
TOWEL OR 17X24 6PK STRL BLUE (TOWEL DISPOSABLE) ×2 IMPLANT
TOWEL OR 17X26 10 PK STRL BLUE (TOWEL DISPOSABLE) ×2 IMPLANT
TUBE ANAEROBIC SPECIMEN COL (MISCELLANEOUS) ×1 IMPLANT
TUBE CONNECTING 12X1/4 (SUCTIONS) ×2 IMPLANT
TUBING CYSTO DISP (UROLOGICAL SUPPLIES) ×2 IMPLANT
WATER STERILE IRR 1000ML POUR (IV SOLUTION) ×2 IMPLANT
YANKAUER SUCT BULB TIP NO VENT (SUCTIONS) ×2 IMPLANT

## 2012-08-24 NOTE — Progress Notes (Signed)
Pt returned from PACU via bed.  VSS. drsg CDI, Lt leg brace in place, VAC at 125, JP x1.  Family at bedside.  Will cont to monitor.

## 2012-08-24 NOTE — Preoperative (Signed)
Beta Blockers   Reason not to administer Beta Blockers:Not Applicable 

## 2012-08-24 NOTE — Brief Op Note (Signed)
08/19/2012 - 08/24/2012  5:32 PM  PATIENT:  Chad [name]  57 y.o. male  PRE-OPERATIVE DIAGNOSIS:  Left thigh wound deshiscence  POST-OPERATIVE DIAGNOSIS: 1.  Left thigh wound dehiscence      2.  Left thigh seroma      3.  Left anteromedial knee abscess  Procedure(s): 1.  Irrigation and excisional debridement of left thigh wound and seroma   2.  Irrigation and excisional debridement of left knee wound   3.  Application of wound VAC to the left knee (5 cm x 2 cm x 2 cm)  SURGEON:  Toni Arthurs, MD  ASSISTANT: n/a  ANESTHESIA:   General  EBL:  100 cc  TOURNIQUET:  n/a  COMPLICATIONS:  None apparent  DISPOSITION:  Extubated, awake and stable to recovery.  DICTATION ID:  [id number]

## 2012-08-24 NOTE — Anesthesia Preprocedure Evaluation (Addendum)
Anesthesia Evaluation  Patient identified by MRN, date of birth, ID band Patient awake    Reviewed: Allergy & Precautions, H&P , NPO status , Patient's Chart, lab work & pertinent test results  History of Anesthesia Complications Negative for: history of anesthetic complications  Airway Mallampati: II TM Distance: >3 FB Neck ROM: Full    Dental  (+) Teeth Intact and Dental Advisory Given   Pulmonary sleep apnea , pneumonia -, resolved,  breath sounds clear to auscultation        Cardiovascular hypertension, Pt. on medications and Pt. on home beta blockers +CHF + dysrhythmias Atrial Fibrillation Rhythm:Regular Rate:Normal  Echo 11/2011  - Left ventricle: The cavity size was mildly dilated. Wall   thickness was increased in a pattern of moderate LVH. The  estimated ejection fraction was 35%. Diffuse hypokinesis.  Features are consistent with a pseudonormal left  ventricular filling pattern, with concomitant abnormal  relaxation and increased filling pressure (grade 2  diastolic dysfunction). Doppler parameters are consistent  with high ventricular filling pressure. - Aortic valve: Sclerosis without stenosis. Trivial   regurgitation. - Mitral valve: Mild regurgitation. Valve area by pressure   half-time: 2.44cm^2. - Left atrium: The atrium was moderately dilated. - Right ventricle: The cavity size was mildly dilated.   Systolic function was mildly reduced. - Right atrium: The atrium was mildly dilated. - Pulmonary arteries: PA peak pressure: 70mm Hg (S). - Inferior vena cava: The vessel was dilated; the   respirophasic diameter changes were blunted (< 50%);   findings are consistent with elevated central venous   pressure. - Pericardium, extracardiac: A small pericardial effusion   was identified posterior to the heart.    Neuro/Psych negative psych ROS   GI/Hepatic negative GI ROS, GERD-  Controlled,(+) Hepatitis -, C and B   Endo/Other  negative endocrine ROS  Renal/GU Renal Insufficiency and CRFRenal disease     Musculoskeletal negative musculoskeletal ROS (+)   Abdominal   Peds  Hematology negative hematology ROS (+)   Anesthesia Other Findings   Reproductive/Obstetrics negative OB ROS                         Anesthesia Physical  Anesthesia Plan  ASA: III  Anesthesia Plan: General   Post-op Pain Management:    Induction: Intravenous  Airway Management Planned: LMA  Additional Equipment:   Intra-op Plan:   Post-operative Plan: Possible Post-op intubation/ventilation  Informed Consent: I have reviewed the patients History and Physical, chart, labs and discussed the procedure including the risks, benefits and alternatives for the proposed anesthesia with the patient or authorized representative who has indicated his/her understanding and acceptance.   Dental advisory given  Plan Discussed with: CRNA and Anesthesiologist  Anesthesia Plan Comments:        Anesthesia Quick Evaluation

## 2012-08-24 NOTE — Care Management Note (Signed)
  Page 2 of 2   08/25/2012     11:17:08 AM   CARE MANAGEMENT NOTE 08/25/2012  Patient:  MILFORD, CILENTO   Account Number:  [id number]  Date Initiated:  08/24/2012  Documentation initiated by:  Ronny Flurry  Subjective/Objective Assessment:     Action/Plan:   Anticipated DC Date:     Anticipated DC Plan:  HOME W HOME HEALTH SERVICES         Choice offered to / List presented to:  [name]        HH arranged  HH-1 RN  HH-2 PT      [facility] agency  [facility]   Status of service:  In process, will continue to follow Medicare Important Message given?   (If response is "NO", the following Medicare IM given date fields will be blank) Date Medicare IM given:   Date Additional Medicare IM given:    Discharge Disposition:    Per UR Regulation:    If discussed at Long Length of Stay Meetings, dates discussed:    Comments:  08-25-12 Confirmed facesheet information with patient .  PT recommendating rolling walker . Patient has a walker with a seat at home ( received last year ) , per patient PT recommendating walker with no seat . Patient's mother had rolling walker in past , patient going to ask his father to look for walker . Explained to patient we can order one if needed . Will follow up .  Patient has applied for disability and Medicaid .  [employer] VAC application started . Waiting MD signature . Spoke with Candy at Dr [employer] office .  VAC application on shadow chart.  Ronny Flurry RN BSN [phone]     08-24-12 Plan for today I and D and possible VAC . [employer] wound VAC application on shadow  chart for MD signature . Ronny Flurry RN BSN [phone]

## 2012-08-24 NOTE — Anesthesia Postprocedure Evaluation (Signed)
  Anesthesia Post-op Note  Patient: Chad [name]  Procedure(s) Performed: Procedure(s): LEFT LEG IRRIGATION AND DEBRIDEMENT WITH POSSIBLE WOUND VAC APPLICATION (Left)  Patient Location: PACU  Anesthesia Type:General  Level of Consciousness: awake, alert  and oriented  Airway and Oxygen Therapy: Patient Spontanous Breathing and Patient connected to nasal cannula oxygen  Post-op Pain: mild  Post-op Assessment: Post-op Vital signs reviewed  Post-op Vital Signs: stable  Complications: No apparent anesthesia complications

## 2012-08-24 NOTE — Progress Notes (Signed)
[name] PROGRESS NOTE  Chad [name] HYQ:[id number] DOB: 1955-05-04 DOA: 08/19/2012 PCP: [name], MD  Assessment/Plan: Cellulitis of the left lower extremity  - continue with empiric antibiotics.  - Afebrile, no leukocytosis  - No xray findings of osteo. Left thigh wound status post recent ORIF  - Orho now following for chronic wound drainage from the surgical site.  - S/p I/D of wound today - Discussed with Dr. [name] - will likely need long-term IV abx. Dr. [name] to discuss case with ID. - Will order PICC in anticipation for long-term abx Chronic kidney disease  - continue present medications. Check appears at baseline. Cont to monitor closely  CHF/nonischemic cardiomyopathy  - appears compensated and continue present medication. Closely follow intake output.  Paroxysmal atrial fibrillation - rate controlled. Continue present medication. Patient is on aspirin.  Code Status: Full Family Communication: Pt in room (indicate person spoken with, relationship, and if by phone, the number) Disposition Plan: Pending   Consultants:  Orthopedic Surgery  Antibiotics: Vanc 08/19/12>>>  Zosyn 08/19/12>>>  HPI/Subjective: No complaints.  Objective: Filed Vitals:   08/24/12 0500 08/24/12 0552 08/24/12 0945 08/24/12 1247  BP:  162/83 161/84 149/83  Pulse:  80 72 74  Temp:  98 F (36.7 C)  98.9 F (37.2 C)  TempSrc:  Oral  Oral  Resp:  16  20  Height:      Weight: 121.9 kg (268 lb 11.9 oz)     SpO2:  94% 95% 94%    Intake/Output Summary (Last 24 hours) at 08/24/12 1417 Last data filed at 08/24/12 0900  Gross per 24 hour  Intake    603 ml  Output   1150 ml  Net   -547 ml   Filed Weights   08/19/12 1915 08/20/12 0658 08/24/12 0500  Weight: 129.7 kg (285 lb 15 oz) 120.7 kg (266 lb 1.5 oz) 121.9 kg (268 lb 11.9 oz)    Exam:   General:  Awake, in nad  Cardiovascular: regular, s1, s2  Respiratory: normal resp effort, no wheezing  Abdomen: soft,  nondistended  Musculoskeletal: perfused, no clubbing   Data Reviewed: Basic Metabolic Panel:  Recent Labs Lab 08/20/12 0440 08/21/12 0510 08/22/12 0852 08/23/12 0540 08/24/12 0450  NA 139 138 137 139 139  K 3.5 3.8 3.6 3.5 3.5  CL 95* 96 96 97 99  CO2 30 31 29 29 30   GLUCOSE 101* 110* 102* 96 92  BUN 53* 53* 49* 50* 51*  CREATININE 3.18* 3.40* 3.59* 3.63* 3.51*  CALCIUM 9.1 8.7 8.9 8.9 8.8   Liver Function Tests:  Recent Labs Lab 08/20/12 0440 08/21/12 0510  AST 30 21  ALT 11 10  ALKPHOS 64 60  BILITOT 0.4 0.4  PROT 6.9 6.6  ALBUMIN 2.6* 2.6*   No results found for this basename: LIPASE, AMYLASE,  in the last 168 hours No results found for this basename: AMMONIA,  in the last 168 hours CBC:  Recent Labs Lab 08/19/12 1646 08/20/12 0440 08/21/12 0510  WBC 11.4* 9.7 9.5  NEUTROABS 9.5* 6.9 6.9  HGB 12.9* 11.4* 11.2*  HCT 41.9 38.8* 37.1*  MCV 88.2 90.2 90.3  PLT 179 156 154   Cardiac Enzymes: No results found for this basename: CKTOTAL, CKMB, CKMBINDEX, TROPONINI,  in the last 168 hours BNP (last 3 results)  Recent Labs  12/30/11 1057 03/12/12 0449 03/15/12 0440  PROBNP 534.0* 14215.0* 30340.0*   CBG: No results found for this basename: GLUCAP,  in the last 168  hours  Recent Results (from the past 240 hour(s))  CULTURE, BLOOD (ROUTINE X 2)     Status: None   Collection Time    08/19/12  5:15 PM      Result Value Range Status   Specimen Description BLOOD ARM RIGHT   Final   Special Requests BOTTLES DRAWN AEROBIC ONLY 7CC   Final   Culture  Setup Time 08/19/2012 22:47   Final   Culture     Final   Value:        BLOOD CULTURE RECEIVED NO GROWTH TO DATE CULTURE WILL BE HELD FOR 5 DAYS BEFORE ISSUING A FINAL NEGATIVE REPORT   Report Status PENDING   Incomplete  CULTURE, BLOOD (SINGLE)     Status: None   Collection Time    08/19/12  7:35 PM      Result Value Range Status   Specimen Description BLOOD ARM LEFT   Final   Special Requests BOTTLES  DRAWN AEROBIC AND ANAEROBIC 10CC   Final   Culture  Setup Time 08/20/2012 03:45   Final   Culture     Final   Value:        BLOOD CULTURE RECEIVED NO GROWTH TO DATE CULTURE WILL BE HELD FOR 5 DAYS BEFORE ISSUING A FINAL NEGATIVE REPORT   Report Status PENDING   Incomplete  WOUND CULTURE     Status: None   Collection Time    08/20/12 10:41 AM      Result Value Range Status   Specimen Description WOUND LEG LEFT   Final   Special Requests Immunocompromised   Final   Gram Stain     Final   Value: NO WBC SEEN     NO SQUAMOUS EPITHELIAL CELLS SEEN     RARE GRAM NEGATIVE RODS   Culture     Final   Value: ABUNDANT PROTEUS MIRABILIS     MODERATE ENTEROCOCCUS SPECIES   Report Status 08/24/2012 FINAL   Final   Organism ID, Bacteria PROTEUS MIRABILIS   Final   Organism ID, Bacteria ENTEROCOCCUS SPECIES   Final  SURGICAL PCR SCREEN     Status: None   Collection Time    08/23/12 11:01 PM      Result Value Range Status   MRSA, PCR NEGATIVE  NEGATIVE Final   Staphylococcus aureus NEGATIVE  NEGATIVE Final   Comment:            The Xpert SA Assay (FDA     approved for NASAL specimens     in patients over 56 years of age),     is one component of     a comprehensive surveillance     program.  Test performance has     been validated by [employer] for patients greater     than or equal to 56 year old.     It is not intended     to diagnose infection nor to     guide or monitor treatment.     Studies: No results found.  Scheduled Meds: . allopurinol  100 mg Oral BID  . amiodarone  200 mg Oral Daily  . aspirin EC  81 mg Oral Daily  . carvedilol  25 mg Oral BID WC  . furosemide  100 mg Oral BID  . hydrALAZINE  50 mg Oral Q8H  . isosorbide mononitrate  60 mg Oral Daily  . pantoprazole  40 mg Oral Daily  . piperacillin-tazobactam (ZOSYN)  IV  3.375 g Intravenous Q8H  . potassium chloride SA  20 mEq Oral Daily  . predniSONE  10 mg Oral Q breakfast  . simvastatin  20 mg Oral q1800   . sirolimus  1.2 mg Oral Daily  . sodium chloride  3 mL Intravenous Q12H  . vancomycin  1,500 mg Intravenous Q48H   Continuous Infusions: . sodium chloride 50 mL/hr at 08/23/12 2254  . lactated ringers 50 mL/hr at 08/24/12 1357    Principal Problem:   Cellulitis of leg, left Active Problems:   Chronic systolic CHF (congestive heart failure)   CKD (chronic kidney disease)   PAF (paroxysmal atrial fibrillation)   History of renal transplant    Time spent:    CHIU, STEPHEN K  [name] Pager [phone]. If 7PM-7AM, please contact night-coverage at [email or web address], password [facility] 08/24/2012, 2:17 PM  LOS: 5 days

## 2012-08-25 DIAGNOSIS — T8140XA Infection following a procedure, unspecified, initial encounter: Principal | ICD-10-CM

## 2012-08-25 DIAGNOSIS — T8149XA Infection following a procedure, other surgical site, initial encounter: Secondary | ICD-10-CM

## 2012-08-25 LAB — BASIC METABOLIC PANEL
BUN: 48 mg/dL — ABNORMAL HIGH (ref 6–23)
Calcium: 8.5 mg/dL (ref 8.4–10.5)
Creatinine, Ser: 3.58 mg/dL — ABNORMAL HIGH (ref 0.50–1.35)
GFR calc non Af Amer: 18 mL/min — ABNORMAL LOW (ref >90)
Glucose, Bld: 125 mg/dL — ABNORMAL HIGH (ref 70–99)
Potassium: 3.6 mEq/L (ref 3.5–5.1)

## 2012-08-25 LAB — CULTURE, BLOOD (ROUTINE X 2): Culture: NO GROWTH

## 2012-08-25 LAB — VANCOMYCIN, TROUGH: Vancomycin Tr: 25.8 ug/mL (ref 10.0–20.0)

## 2012-08-25 MED ORDER — HEPARIN SODIUM (PORCINE) 5000 UNIT/ML IJ SOLN
5000.0000 [IU] | Freq: Three times a day (TID) | INTRAMUSCULAR | Status: DC
  Administered 2012-08-25 – 2012-08-30 (×15): 5000 [IU] via SUBCUTANEOUS
  Filled 2012-08-25 (×19): qty 1

## 2012-08-25 MED ORDER — VANCOMYCIN HCL 10 G IV SOLR
1500.0000 mg | INTRAVENOUS | Status: DC
  Administered 2012-08-26 – 2012-08-29 (×2): 1500 mg via INTRAVENOUS
  Filled 2012-08-25 (×2): qty 1500

## 2012-08-25 NOTE — Progress Notes (Signed)
Physical Therapy Treatment Patient Details Name: Chad [name] MRN: [id number] DOB: 1955/10/27 Today's Date: 08/25/2012 Time: 1525-1550 PT Time Calculation (min): 25 min  PT Assessment / Plan / Recommendation  History of Present Illness 57 y/o male now almost 6 weeks post op from ORIF of left supracondylar femur fracture. He presented to clinic yesterday with swelling of the left lower extremity. Negative for DVT; +cellulitis. He has been at home now for two weeks after spending several weeks at [facility]. He has been NWB on the L LE. s/p I&D 08/24/12 and continues to be NWB.    PT Comments   Pt sitting EOB when entering and just returned from w/c.  Pt willing to stand perform standing balance with therapist.  Pt with s/p I&D on left LE and currently NWB.  Pt now with left KI however no orders in chart on KI and on ROM (if any restrictions).  Pt will continue to benefit from HHPT however may benefit from drop arm 3n1 to perform squat pivot transfers safely.   Follow Up Recommendations  Home health PT;Supervision for mobility/OOB     Equipment Recommendations  Rolling walker with 5" wheels;3in1 (PT) (drop arm 3n1 for squat pivot transfers)    Recommendations for Other Services OT consult  Frequency Min 3X/week   Progress towards PT Goals Progress towards PT goals: Progressing toward goals  Plan Current plan remains appropriate    Precautions / Restrictions Precautions Precautions: Fall Required Braces or Orthoses: Knee Immobilizer - Left Knee Immobilizer - Left: On at all times Restrictions Weight Bearing Restrictions: Yes LLE Weight Bearing: Non weight bearing (updated )   Pertinent Vitals/Pain No c/o pain    Mobility  Bed Mobility Bed Mobility: Not assessed Transfers Transfers: Sit to Stand;Stand to Sit;Stand Pivot Transfers Sit to Stand: 4: Min guard;From bed;With upper extremity assist Stand to Sit: 4: Min guard;To chair/3-in-1;With armrests;With upper extremity assist Details  for Transfer Assistance: Minguard for safety with cues for hand placement Ambulation/Gait Ambulation/Gait Assistance: Not tested (comment)    Exercises     PT Diagnosis:    PT Problem List:   PT Treatment Interventions:     PT Goals (current goals can now be found in the care plan section) Acute Rehab PT Goals Patient Stated Goal: be able to walk again and go up steps PT Goal Formulation: With patient Time For Goal Achievement: 08/28/12 Potential to Achieve Goals: Good  Visit Information  Last PT Received On: 08/25/12 Assistance Needed: +1 History of Present Illness: 56 y/o male now almost 6 weeks post op from ORIF of left supracondylar femur fracture. He presented to clinic yesterday with swelling of the left lower extremity. Negative for DVT; +cellulitis. He has been at home now for two weeks after spending several weeks at [facility]. He has been NWB on the L LE. s/p I&D 08/24/12 and continues to be NWB.     Subjective Data  Subjective: Pt sitting EOB and willing to stand for therapist. Patient Stated Goal: be able to walk again and go up steps   Cognition  Cognition Arousal/Alertness: Awake/alert Behavior During Therapy: WFL for tasks assessed/performed Overall Cognitive Status: Within Functional Limits for tasks assessed    Balance  Balance Balance Assessed: Yes Dynamic Sitting Balance Dynamic Sitting - Balance Support: Bilateral upper extremity supported Dynamic Sitting - Level of Assistance: 5: Stand by assistance Static Standing Balance Static Standing - Balance Support: Bilateral upper extremity supported;During functional activity Static Standing - Level of Assistance: 5: Stand by assistance  Dynamic Standing Balance Dynamic Standing - Balance Support: Bilateral upper extremity supported Dynamic Standing - Level of Assistance: 5: Stand by assistance Dynamic Standing - Comments: Pt performed heel raises on right LE 2 x10 to prepare for hopping.  Pt also performed pivoting  on right LE to simulate transfer.  End of Session PT - End of Session Equipment Utilized During Treatment: Gait belt;Left knee immobilizer Activity Tolerance: Patient tolerated treatment well Patient left: with call bell/phone within reach;Other (comment);in bed;with family/visitor present (sitting EOB) Nurse Communication: Mobility status   GP     Kynadi Dragos 08/25/2012, 5:12 PM  Jake Shark, PT DPT [phone]

## 2012-08-25 NOTE — Transfer of Care (Signed)
Immediate Anesthesia Transfer of Care Note  Patient: Chad [name]  Procedure(s) Performed: Procedure(s): LEFT LEG IRRIGATION AND DEBRIDEMENT WITH POSSIBLE WOUND VAC APPLICATION (Left)  Patient Location: PACU  Anesthesia Type:General  Level of Consciousness: awake, alert  and oriented  Airway & Oxygen Therapy: Patient Spontanous Breathing and Patient connected to nasal cannula oxygen  Post-op Assessment: Report given to PACU RN and Post -op Vital signs reviewed and stable  Post vital signs: Reviewed and stable  Complications: No apparent anesthesia complications 

## 2012-08-25 NOTE — Progress Notes (Signed)
Subjective: 1 Day Post-Op Procedure(s) (LRB): LEFT LEG IRRIGATION AND DEBRIDEMENT WITH POSSIBLE WOUND VAC APPLICATION (Left) Patient reports pain as mild.  No n/v/f/c.  Objective: Vital signs in last 24 hours: Temp:  [98.1 F (36.7 C)-98.8 F (37.1 C)] 98.7 F (37.1 C) (07/30 1300) Pulse Rate:  [73-86] 77 (07/30 1300) Resp:  [18-20] 20 (07/30 1300) BP: (124-131)/(68-74) 130/74 mmHg (07/30 1300) SpO2:  [95 %-98 %] 98 % (07/30 1300) Weight:  [123.1 kg (271 lb 6.2 oz)] 123.1 kg (271 lb 6.2 oz) (07/30 0531)  Intake/Output from previous day: 07/29 0701 - 07/30 0700 In: 1003 [I.V.:1003] Out: 700 [Urine:600; Blood:100] Intake/Output this shift:    No results found for this basename: HGB,  in the last 72 hours No results found for this basename: WBC, RBC, HCT, PLT,  in the last 72 hours  Recent Labs  08/24/12 0450 08/25/12 0505  NA 139 140  K 3.5 3.6  CL 99 99  CO2 30 30  BUN 51* 48*  CREATININE 3.51* 3.58*  GLUCOSE 92 125*  CALCIUM 8.8 8.5   No results found for this basename: LABPT, INR,  in the last 72 hours  PE:  L LE dressed and dry with wv in place.  SS drainage in vac and HV drain.  Assessment/Plan: 1 Day Post-Op Procedure(s) (LRB): LEFT LEG IRRIGATION AND DEBRIDEMENT WITH POSSIBLE WOUND VAC APPLICATION (Left) Up with therapy  NWB on L LE.  Continue HV drain and WV.  Hopefully wound VAC change tomorrow afternoon followed by [employer] dressing for L LE.  Abx per ID.  Toni Arthurs 08/25/2012, 7:08 PM

## 2012-08-25 NOTE — Transfer of Care (Signed)
Immediate Anesthesia Transfer of Care Note  Patient: Chad [name]  Procedure(s) Performed: Procedure(s): LEFT LEG IRRIGATION AND DEBRIDEMENT WITH POSSIBLE WOUND VAC APPLICATION (Left)  Patient Location: PACU  Anesthesia Type:General  Level of Consciousness: awake, alert  and oriented  Airway & Oxygen Therapy: Patient Spontanous Breathing and Patient connected to nasal cannula oxygen  Post-op Assessment: Report given to PACU RN and Post -op Vital signs reviewed and stable  Post vital signs: Reviewed and stable  Complications: No apparent anesthesia complications

## 2012-08-25 NOTE — Progress Notes (Signed)
[name]                                                                                Patient Demographics  Chad [name], is a 57 y.o. male, DOB - Aug 30, 1955, EAV:[id number], [name]:[id number]  Admit date - 08/19/2012  Admitting Physician Kathlen Mody, MD  Outpatient Primary MD for the patient is [name], MD  LOS - 6   Chief Complaint  Patient presents with  . Leg Swelling        Assessment & Plan    1. Postoperative wound infection of the left leg after initial ORIF of the left femur 4 weeks ago. He is currently on broad-spectrum antibiotics, status post incision and drainage by Dr. [name] on 08/24/2012, ID has been consulted for recommendations on long-term antibiotics. We'll resume PT, PICC line to be placed, will also order home health versus nursing home placement evaluation.    2. 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 Last EF 35% on echogram done in December 2013 long with grade 2 diastolic dysfunction- he remains compensated from the standpoint, he is on hydralazine, Coreg, Lasix at home dose. Along with aspirin and amiodarone.    3. History of paroxysmal atrial fibrillation patient on amiodarone along with Coreg which will be continued. Also on home dose 81 mg aspirin.     4. Dyslipidemia on statin which is being continued.     5. History of renal transplant and chronic kidney disease stage V. Creatinine at baseline, continue his home immunosuppressants unchanged.     6.H/O Hep C - stable.       Code Status: full  Family Communication: none present  Disposition Plan: TBD   Procedures x-ray left to be a fibula, chest x-ray, I&D of the left leg by Dr. [name]   Consults  orthopedics and ID   DVT Prophylaxis  Heparin    Lab Results  Component Value Date   PLT 154 08/21/2012    Medications  Scheduled Meds: . allopurinol  100 mg Oral BID  . amiodarone  200 mg Oral Daily  .  aspirin EC  81 mg Oral Daily  . carvedilol  25 mg Oral BID WC  . furosemide  100 mg Oral BID  . hydrALAZINE  50 mg Oral Q8H  . isosorbide mononitrate  60 mg Oral Daily  . pantoprazole  40 mg Oral Daily  . piperacillin-tazobactam (ZOSYN)  IV  3.375 g Intravenous Q8H  . potassium chloride SA  20 mEq Oral Daily  . predniSONE  10 mg Oral Q breakfast  . simvastatin  20 mg Oral q1800  . sirolimus  1.2 mg Oral Daily  . sodium chloride  3 mL Intravenous Q12H  . vancomycin  1,500 mg Intravenous Q48H   Continuous Infusions: . sodium chloride 50 mL/hr at 08/24/12 2219  . lactated ringers 50 mL/hr at 08/24/12 1357   PRN Meds:.acetaminophen, acetaminophen, HYDROmorphone (DILAUDID) injection, meperidine (DEMEROL) injection, methocarbamol, ondansetron (ZOFRAN) IV, ondansetron, oxyCODONE, promethazine  Antibiotics    Anti-infectives   Start     Dose/Rate Route Frequency Ordered Stop   08/21/12 1800  vancomycin (VANCOCIN) 1,500 mg in sodium chloride 0.9 %  500 mL IVPB     1,500 mg 250 mL/hr over 120 Minutes Intravenous Every 48 hours 08/19/12 1933     08/20/12 0600  piperacillin-tazobactam (ZOSYN) IVPB 3.375 g     3.375 g 12.5 mL/hr over 240 Minutes Intravenous 3 times per day 08/20/12 0106     08/20/12 0500  ciprofloxacin (CIPRO) IVPB 400 mg  Status:  Discontinued     400 mg 200 mL/hr over 60 Minutes Intravenous Every 24 hours 08/19/12 2350 08/20/12 0105   08/19/12 1700  piperacillin-tazobactam (ZOSYN) IVPB 3.375 g  Status:  Discontinued     3.375 g 12.5 mL/hr over 240 Minutes Intravenous  Once 08/19/12 1646 08/19/12 1647   08/19/12 1700  piperacillin-tazobactam (ZOSYN) IVPB 3.375 g     3.375 g 100 mL/hr over 30 Minutes Intravenous  Once 08/19/12 1648 08/19/12 2024   08/19/12 1700  vancomycin (VANCOCIN) 2,500 mg in sodium chloride 0.9 % 500 mL IVPB     2,500 mg 250 mL/hr over 120 Minutes Intravenous To Emergency Dept 08/19/12 1650 08/19/12 1949       Time Spent in minutes    35   SINGH,PRASHANT K M.D on 08/25/2012 at 2:03 PM  Between 7am to 7pm - Pager - [phone]  After 7pm go to [email or web address] - password [email or web address]  And look for the night coverage person covering for me after hours  [facility] Office  [phone]    Subjective:   Gery Sabedra today has, No headache, No chest pain, No abdominal pain - No Nausea, No new weakness tingling or numbness, No Cough - SOB.    Objective:   Filed Vitals:   08/24/12 1845 08/24/12 2038 08/25/12 0500 08/25/12 0531  BP: 135/73 124/68  131/69  Pulse: 72 73  86  Temp: 97.9 F (36.6 C) 98.1 F (36.7 C)  98.8 F (37.1 C)  TempSrc: Oral Axillary  Oral  Resp: 20 18  18   Height:      Weight:   123.1 kg (271 lb 6.2 oz) 123.1 kg (271 lb 6.2 oz)  SpO2: 95% 95%  95%    Wt Readings from Last 3 Encounters:  08/25/12 123.1 kg (271 lb 6.2 oz)  08/25/12 123.1 kg (271 lb 6.2 oz)  08/05/12 129.7 kg (285 lb 15 oz)     Intake/Output Summary (Last 24 hours) at 08/25/12 1403 Last data filed at 08/24/12 2215  Gross per 24 hour  Intake   1003 ml  Output    100 ml  Net    903 ml    Exam Awake Alert, Oriented X 3, No new F.N deficits, Normal affect [place].AT,PERRAL Supple Neck,No JVD, No cervical lymphadenopathy appriciated.  Symmetrical Chest wall movement, Good air movement bilaterally, CTAB RRR,No Gallops,Rubs or new Murmurs, No Parasternal Heave +ve B.Sounds, Abd Soft, Non tender, No organomegaly appriciated, No rebound - guarding or rigidity. No Cyanosis, Clubbing or edema, No new Rash or bruise , left leg in bandage and splint  Data Review   Micro Results Recent Results (from the past 240 hour(s))  CULTURE, BLOOD (ROUTINE X 2)     Status: None   Collection Time    08/19/12  5:15 PM      Result Value Range Status   Specimen Description BLOOD ARM RIGHT   Final   Special Requests BOTTLES DRAWN AEROBIC ONLY [facility]   Final   Culture  Setup Time 08/19/2012 22:47   Final   Culture NO GROWTH 5 DAYS    Final  Report Status 08/25/2012 FINAL   Final  CULTURE, BLOOD (SINGLE)     Status: None   Collection Time    08/19/12  7:35 PM      Result Value Range Status   Specimen Description BLOOD ARM LEFT   Final   Special Requests BOTTLES DRAWN AEROBIC AND ANAEROBIC 10CC   Final   Culture  Setup Time 08/20/2012 03:45   Final   Culture     Final   Value:        BLOOD CULTURE RECEIVED NO GROWTH TO DATE CULTURE WILL BE HELD FOR 5 DAYS BEFORE ISSUING A FINAL NEGATIVE REPORT   Report Status PENDING   Incomplete  WOUND CULTURE     Status: None   Collection Time    08/20/12 10:41 AM      Result Value Range Status   Specimen Description WOUND LEG LEFT   Final   Special Requests Immunocompromised   Final   Gram Stain     Final   Value: NO WBC SEEN     NO SQUAMOUS EPITHELIAL CELLS SEEN     RARE GRAM NEGATIVE RODS   Culture     Final   Value: ABUNDANT PROTEUS MIRABILIS     MODERATE ENTEROCOCCUS SPECIES   Report Status 08/24/2012 FINAL   Final   Organism ID, Bacteria PROTEUS MIRABILIS   Final   Organism ID, Bacteria ENTEROCOCCUS SPECIES   Final  SURGICAL PCR SCREEN     Status: None   Collection Time    08/23/12 11:01 PM      Result Value Range Status   MRSA, PCR NEGATIVE  NEGATIVE Final   Staphylococcus aureus NEGATIVE  NEGATIVE Final   Comment:            The Xpert SA Assay (FDA     approved for NASAL specimens     in patients over 39 years of age),     is one component of     a comprehensive surveillance     program.  Test performance has     been validated by [employer] for patients greater     than or equal to 59 year old.     It is not intended     to diagnose infection nor to     guide or monitor treatment.  ANAEROBIC CULTURE     Status: None   Collection Time    08/24/12  4:08 PM      Result Value Range Status   Specimen Description ABSCESS LEFT KNEE   Final   Special Requests PATIENT ON FOLLOWING VANCOMYCIN ZOSYN   Final   Gram Stain     Final   Value: NO WBC SEEN      NO SQUAMOUS EPITHELIAL CELLS SEEN     NO ORGANISMS SEEN   Culture     Final   Value: NO ANAEROBES ISOLATED; CULTURE IN PROGRESS FOR 5 DAYS   Report Status PENDING   Incomplete  CULTURE, ROUTINE-ABSCESS     Status: None   Collection Time    08/24/12  4:08 PM      Result Value Range Status   Specimen Description ABSCESS LEFT KNEE   Final   Special Requests PATIENT ON FOLLOWING VANCOMYCIN ZOSYN   Final   Gram Stain     Final   Value: NO WBC SEEN     NO SQUAMOUS EPITHELIAL CELLS SEEN     NO ORGANISMS SEEN   Culture  NO GROWTH 1 DAY   Final   Report Status PENDING   Incomplete  TISSUE CULTURE     Status: None   Collection Time    08/24/12  4:08 PM      Result Value Range Status   Specimen Description TISSUE LEFT KNEE   Final   Special Requests PATIENT ON FOLLOWING VANCOMYCIN ZOSYN   Final   Gram Stain     Final   Value: NO WBC SEEN     NO SQUAMOUS EPITHELIAL CELLS SEEN     NO ORGANISMS SEEN   Culture NO GROWTH 1 DAY   Final   Report Status PENDING   Incomplete    Radiology Reports Dg Chest 2 View  07/29/2012   *RADIOLOGY REPORT*  Clinical Data: Shortness of breath, postop left leg surgery  CHEST - 2 VIEW  Comparison: Portable chest x-ray of 07/11/2012  Findings: The lungs are not optimally aerated with some elevation of the right hemidiaphragm and mild right basilar atelectasis. Moderate cardiomegaly is stable.  Resolution is limited due to body habitus.  IMPRESSION: Suboptimal aeration with elevation of the right hemidiaphragm. Stable moderate cardiomegaly.   Original Report Authenticated By: Dwyane Dee, M.D.   Dg Femur Left  08/20/2012   *RADIOLOGY REPORT*  Clinical Data: Swelling, redness, and pain starting around the left ankle and extending  up the left leg.  Recent fracture and surgery.  LEFT FEMUR - 2 VIEW  Comparison: 07/16/2012  Findings: Postoperative changes with lateral plate and screw fixation of a comminuted fracture of the distal left femoral metaphysis.  Fracture  fragments appears stable in position since previous study.  Hardware components appear intact without change in position.  There is some loss of distinction of the fracture line and periosteal reaction suggesting healing.  No focal bone lesion or bone destruction is appreciated.  No cortical destruction or erosion. Soft tissue swelling distal to the patella may represent hematoma or edema.  Small left knee effusion.  No radiopaque soft tissue foreign body or soft tissue gas collections are demonstrated.  Vascular calcifications.  Left hip arthroplasty.  IMPRESSION: No specific evidence of osteomyelitis.  Plate and screw fixation of a healing comminuted transverse fracture of the distal left femoral metaphysis.   Original Report Authenticated By: Burman Nieves, M.D.   Dg Tibia/fibula Left  08/20/2012   *RADIOLOGY REPORT*  Clinical Data: Swelling, redness, and pain starting around the left ankle and extending proximally of the left leg.  Recent fracture and surgery to the distal femur 07/10/2012.  LEFT TIBIA AND FIBULA - 2 VIEW  Comparison: Left femur 07/16/2012  Findings: AP views of the left tibia and fibula are obtained. There is focal periosteal reaction along the distal aspect of the left tibia and less promptly along the metaphyseal region of the distal tibia.  These changes could be due to healing nondisplaced fractures or stress fractures.  Osteomyelitis less likely.  There is no evidence of any cortical destruction or bone lucency.  No radiopaque soft tissue foreign bodies or soft tissue gas collections.  IMPRESSION: Periosteal reaction is demonstrated along the distal left tibia and fibula may represent healing fractures or stress fractures.  No specific evidence of osteomyelitis.  No soft tissue gas collections.   Original Report Authenticated By: Burman Nieves, M.D.    CBC  Recent Labs Lab 08/19/12 1646 08/20/12 0440 08/21/12 0510  WBC 11.4* 9.7 9.5  HGB 12.9* 11.4* 11.2*  HCT 41.9 38.8*  37.1*  PLT 179 156 154  MCV 88.2 90.2 90.3  MCH 27.2 26.5 27.3  MCHC 30.8 29.4* 30.2  RDW 17.7* 17.6* 17.5*  LYMPHSABS 1.1 1.3 1.3  MONOABS 0.6 1.3* 1.1*  EOSABS 0.2 0.2 0.2  BASOSABS 0.0 0.1 0.1    Chemistries   Recent Labs Lab 08/20/12 0440 08/21/12 0510 08/22/12 0852 08/23/12 0540 08/24/12 0450 08/25/12 0505  NA 139 138 137 139 139 140  K 3.5 3.8 3.6 3.5 3.5 3.6  CL 95* 96 96 97 99 99  CO2 30 31 29 29 30 30   GLUCOSE 101* 110* 102* 96 92 125*  BUN 53* 53* 49* 50* 51* 48*  CREATININE 3.18* 3.40* 3.59* 3.63* 3.51* 3.58*  CALCIUM 9.1 8.7 8.9 8.9 8.8 8.5  AST 30 21  --   --   --   --   ALT 11 10  --   --   --   --   ALKPHOS 64 60  --   --   --   --   BILITOT 0.4 0.4  --   --   --   --    ------------------------------------------------------------------------------------------------------------------ estimated creatinine clearance is 32 ml/min (by C-G formula based on Cr of 3.58). ------------------------------------------------------------------------------------------------------------------ No results found for this basename: HGBA1C,  in the last 72 hours ------------------------------------------------------------------------------------------------------------------ No results found for this basename: CHOL, HDL, LDLCALC, TRIG, CHOLHDL, LDLDIRECT,  in the last 72 hours ------------------------------------------------------------------------------------------------------------------ No results found for this basename: TSH, T4TOTAL, FREET3, T3FREE, THYROIDAB,  in the last 72 hours ------------------------------------------------------------------------------------------------------------------ No results found for this basename: VITAMINB12, FOLATE, FERRITIN, TIBC, IRON, RETICCTPCT,  in the last 72 hours  Coagulation profile No results found for this basename: INR, PROTIME,  in the last 168 hours  No results found for this basename: DDIMER,  in the last 72  hours  Cardiac Enzymes No results found for this basename: CK, CKMB, TROPONINI, MYOGLOBIN,  in the last 168 hours ------------------------------------------------------------------------------------------------------------------ No components found with this basename: POCBNP,

## 2012-08-25 NOTE — Progress Notes (Signed)
ANTIBIOTIC CONSULT NOTE - FOLLOW UP  Pharmacy Consult for Vancomycin Indication: L knee abscess  No Known Allergies  Patient Measurements: Height: 6' 0.83" (185 cm) Weight: 271 lb 6.2 oz (123.1 kg) IBW/kg (Calculated) : 79.52 Adjusted Body Weight:    Vital Signs: Temp: 98.7 F (37.1 C) (07/30 1300) Temp src: Oral (07/30 1300) BP: 130/74 mmHg (07/30 1300) Pulse Rate: 77 (07/30 1300) Intake/Output from previous day: 07/29 0701 - 07/30 0700 In: 1003 [I.V.:1003] Out: 700 [Urine:600; Blood:100] Intake/Output from this shift: Total I/O In: 700 [P.O.:700] Out: 395 [Urine:350; Drains:45]  Labs:  Recent Labs  08/23/12 0540 08/24/12 0450 08/25/12 0505  CREATININE 3.63* 3.51* 3.58*   Estimated Creatinine Clearance: 32 ml/min (by C-G formula based on Cr of 3.58).  Recent Labs  08/25/12 1653  VANCOTROUGH 25.8*     Microbiology: Recent Results (from the past 720 hour(s))  CULTURE, BLOOD (ROUTINE X 2)     Status: None   Collection Time    08/19/12  5:15 PM      Result Value Range Status   Specimen Description BLOOD ARM RIGHT   Final   Special Requests BOTTLES DRAWN AEROBIC ONLY [facility]   Final   Culture  Setup Time 08/19/2012 22:47   Final   Culture NO GROWTH 5 DAYS   Final   Report Status 08/25/2012 FINAL   Final  CULTURE, BLOOD (SINGLE)     Status: None   Collection Time    08/19/12  7:35 PM      Result Value Range Status   Specimen Description BLOOD ARM LEFT   Final   Special Requests BOTTLES DRAWN AEROBIC AND ANAEROBIC 10CC   Final   Culture  Setup Time 08/20/2012 03:45   Final   Culture     Final   Value:        BLOOD CULTURE RECEIVED NO GROWTH TO DATE CULTURE WILL BE HELD FOR 5 DAYS BEFORE ISSUING A FINAL NEGATIVE REPORT   Report Status PENDING   Incomplete  WOUND CULTURE     Status: None   Collection Time    08/20/12 10:41 AM      Result Value Range Status   Specimen Description WOUND LEG LEFT   Final   Special Requests Immunocompromised   Final   Gram  Stain     Final   Value: NO WBC SEEN     NO SQUAMOUS EPITHELIAL CELLS SEEN     RARE GRAM NEGATIVE RODS   Culture     Final   Value: ABUNDANT PROTEUS MIRABILIS     MODERATE ENTEROCOCCUS SPECIES   Report Status 08/24/2012 FINAL   Final   Organism ID, Bacteria PROTEUS MIRABILIS   Final   Organism ID, Bacteria ENTEROCOCCUS SPECIES   Final  SURGICAL PCR SCREEN     Status: None   Collection Time    08/23/12 11:01 PM      Result Value Range Status   MRSA, PCR NEGATIVE  NEGATIVE Final   Staphylococcus aureus NEGATIVE  NEGATIVE Final   Comment:            The Xpert SA Assay (FDA     approved for NASAL specimens     in patients over 28 years of age),     is one component of     a comprehensive surveillance     program.  Test performance has     been validated by [employer] for patients greater  than or equal to 78 year old.     It is not intended     to diagnose infection nor to     guide or monitor treatment.  ANAEROBIC CULTURE     Status: None   Collection Time    08/24/12  4:08 PM      Result Value Range Status   Specimen Description ABSCESS LEFT KNEE   Final   Special Requests PATIENT ON FOLLOWING VANCOMYCIN ZOSYN   Final   Gram Stain     Final   Value: NO WBC SEEN     NO SQUAMOUS EPITHELIAL CELLS SEEN     NO ORGANISMS SEEN   Culture     Final   Value: NO ANAEROBES ISOLATED; CULTURE IN PROGRESS FOR 5 DAYS   Report Status PENDING   Incomplete  CULTURE, ROUTINE-ABSCESS     Status: None   Collection Time    08/24/12  4:08 PM      Result Value Range Status   Specimen Description ABSCESS LEFT KNEE   Final   Special Requests PATIENT ON FOLLOWING VANCOMYCIN ZOSYN   Final   Gram Stain     Final   Value: NO WBC SEEN     NO SQUAMOUS EPITHELIAL CELLS SEEN     NO ORGANISMS SEEN   Culture NO GROWTH 1 DAY   Final   Report Status PENDING   Incomplete  TISSUE CULTURE     Status: None   Collection Time    08/24/12  4:08 PM      Result Value Range Status   Specimen  Description TISSUE LEFT KNEE   Final   Special Requests PATIENT ON FOLLOWING VANCOMYCIN ZOSYN   Final   Gram Stain     Final   Value: NO WBC SEEN     NO SQUAMOUS EPITHELIAL CELLS SEEN     NO ORGANISMS SEEN   Culture NO GROWTH 1 DAY   Final   Report Status PENDING   Incomplete    Anti-infectives   Start     Dose/Rate Route Frequency Ordered Stop   08/21/12 1800  vancomycin (VANCOCIN) 1,500 mg in sodium chloride 0.9 % 500 mL IVPB  Status:  Discontinued     1,500 mg 250 mL/hr over 120 Minutes Intravenous Every 48 hours 08/19/12 1933 08/25/12 1753   08/20/12 0600  piperacillin-tazobactam (ZOSYN) IVPB 3.375 g     3.375 g 12.5 mL/hr over 240 Minutes Intravenous 3 times per day 08/20/12 0106     08/20/12 0500  ciprofloxacin (CIPRO) IVPB 400 mg  Status:  Discontinued     400 mg 200 mL/hr over 60 Minutes Intravenous Every 24 hours 08/19/12 2350 08/20/12 0105   08/19/12 1700  piperacillin-tazobactam (ZOSYN) IVPB 3.375 g  Status:  Discontinued     3.375 g 12.5 mL/hr over 240 Minutes Intravenous  Once 08/19/12 1646 08/19/12 1647   08/19/12 1700  piperacillin-tazobactam (ZOSYN) IVPB 3.375 g     3.375 g 100 mL/hr over 30 Minutes Intravenous  Once 08/19/12 1648 08/19/12 2024   08/19/12 1700  vancomycin (VANCOCIN) 2,500 mg in sodium chloride 0.9 % 500 mL IVPB     2,500 mg 250 mL/hr over 120 Minutes Intravenous To Emergency Dept 08/19/12 1650 08/19/12 1949      Assessment: LLE cellulitis, hx recent ORIF 6/14, no osteo, 7/29 to OR for left leg I&D and wound vac placement   Vanc 7/24 >> Vanco trough 7/30= 25.8 Zosyn 7/24 >>  7/25 wound cx -  abundant proteus mirabilis sens to all, and moderate enteroccus - sens to amp and vanc 7/24 blood cx (single) - Negative 7/29 Tissue left knee cx: NG x 1 day 7/29 Abscess left knee>>   Goal of Therapy:  Vancomycin trough level 10-15 mcg/ml  Plan:  Change Vancomycin to 1500mg  IV q72hrs  Misty Stanley Stillinger 08/25/2012,6:54 PM

## 2012-08-25 NOTE — Consult Note (Signed)
WOC consult Note Reason for Consult:Unna Boot Left Leg.  Patient is back from OR procedure last pm; Dr. [name] evacuated a seroma of the left thigh and placed a negative pressure wound therapy device on the left knee.  Noted is a JP drain from left thigh site.  Original post operative wraps not removed at this time.   Per patient, he wore Unna's boot in the past and was also followed by outpatient wound care center at [facility], but has not been seen there in 3 months. Patient states that there is a posterior LE wound. LE's are elevated at this time and ortho is following for POC. If Unna's Boot is desired after post operative assessment, please re-order consult. WOC Nursing Team will not follow.  Please re-consult if needed. Thanks, Ladona Mow, MSN, RN, [facility], [facility]N [phone])

## 2012-08-25 NOTE — Consult Note (Addendum)
[facility]    Date of Admission:  08/19/2012           Day 7 vancomycin        Day 7 piperacillin tazobactam       Reason for Consult: Wound infection following recent open reduction and internal fixation of distal left femur fracture    Referring Physician: Dr. Toni Arthurs  Principal Problem:   Postoperative wound infection Active Problems:   Diarrhea   Chronic systolic CHF (congestive heart failure)   CKD (chronic kidney disease)   Hypertension   PAF (paroxysmal atrial fibrillation)   History of renal transplant   Fracture, femur, distal   . allopurinol  100 mg Oral BID  . amiodarone  200 mg Oral Daily  . aspirin EC  81 mg Oral Daily  . carvedilol  25 mg Oral BID WC  . furosemide  100 mg Oral BID  . hydrALAZINE  50 mg Oral Q8H  . isosorbide mononitrate  60 mg Oral Daily  . pantoprazole  40 mg Oral Daily  . piperacillin-tazobactam (ZOSYN)  IV  3.375 g Intravenous Q8H  . potassium chloride SA  20 mEq Oral Daily  . predniSONE  10 mg Oral Q breakfast  . simvastatin  20 mg Oral q1800  . sirolimus  1.2 mg Oral Daily  . sodium chloride  3 mL Intravenous Q12H  . vancomycin  1,500 mg Intravenous Q48H    Recommendations: 1. Continue current antibiotics pending the operative culture results  2. Check C. difficile PCR 3. Contact precautions  Assessment: He probably has a deeper postoperative wound tracking down to the plate and fracture site. I would continue current antibiotics pending operative culture results. Dr. Daiva Eves will follow with you tomorrow.    HPI: Chad [name] is a 57 y.o. male who fell and sustained a distal left femur fracture on June 14. He underwent open reduction and internal fixation with a plate and had a prolonged hospitalization for inpatient rehabilitation. He was discharged on July 11 with an Unaboot. The Unaboot was removed recently and he started to have increase swelling and erythema around the low. He  has had some persistent thin drainage from one area of the wound. Swab cultures of wound drainage on admission have grown a very sensitive Proteus and enterococcus but may not represent true pathogens. He was started on empiric vancomycin and piperacillin tazobactam and had I and D yesterday. A large seroma was encountered that tracked down to the plate. There is also an area that looked like old hematoma lateral to the knee but not involving the joint. Operative Gram stain showed no organisms and cultures are pending   Review of Systems: Constitutional: positive for anorexia and weight loss, negative for chills, fevers and sweats Eyes: negative Ears, nose, mouth, throat, and face: negative Respiratory: negative Cardiovascular: negative Gastrointestinal: positive for diarrhea that started yesterday, negative for abdominal pain, nausea and vomiting Genitourinary:negative for dysuria and discomfort over right lower quadrant transplant kidney Integument/breast: negative  Past Medical History  Diagnosis Date  . Hypertension   . Hepatitis C 1996  . CKD (chronic kidney disease), stage IV     a. s/p renal transplant 1996.  Marland Kitchen Pneumonia   . HLD (hyperlipidemia)   . Nonischemic cardiomyopathy     a. unknown etiology, EF 30-35% by echo 09/2011;  b. 09/2011 Lexi MV EF 42%, no ischemia/infarct.  . Systolic CHF   .  Atrial flutter     a. in the setting of sepsis 05/2011; on amio; no anticoagulation    History  Substance Use Topics  . Smoking status: Never Smoker   . Smokeless tobacco: Never Used  . Alcohol Use: No    Family History  Problem Relation Age of Onset  . Heart disease Neg Hx    No Known Allergies  OBJECTIVE: Blood pressure 131/69, pulse 86, temperature 98.8 F (37.1 C), temperature source Oral, resp. rate 18, height 6' 0.84" (1.85 m), weight 123.1 kg (271 lb 6.2 oz), SpO2 95.00%. General: He is alert and in no distress  Skin: No rash Lungs: Clear Cor: Regular S1 and S2 with a  1/6 systolic murmur soft and nontender. Abdomen:   Healed incision right lower quadrant. Left leg and bulky Ace wrap moderate amount of blood and Jackson-Pratt drain  Lab Results  Component Value Date   WBC 9.5 08/21/2012   HGB 11.2* 08/21/2012   HCT 37.1* 08/21/2012   MCV 90.3 08/21/2012   PLT 154 08/21/2012   Lab Results  Component Value Date   CREATININE 3.58* 08/25/2012   BUN 48* 08/25/2012   NA 140 08/25/2012   K 3.6 08/25/2012   CL 99 08/25/2012   CO2 30 08/25/2012   Microbiology: Recent Results (from the past 240 hour(s))  CULTURE, BLOOD (ROUTINE X 2)     Status: None   Collection Time    08/19/12  5:15 PM      Result Value Range Status   Specimen Description BLOOD ARM RIGHT   Final   Special Requests BOTTLES DRAWN AEROBIC ONLY [facility]   Final   Culture  Setup Time 08/19/2012 22:47   Final   Culture NO GROWTH 5 DAYS   Final   Report Status 08/25/2012 FINAL   Final  CULTURE, BLOOD (SINGLE)     Status: None   Collection Time    08/19/12  7:35 PM      Result Value Range Status   Specimen Description BLOOD ARM LEFT   Final   Special Requests BOTTLES DRAWN AEROBIC AND ANAEROBIC 10CC   Final   Culture  Setup Time 08/20/2012 03:45   Final   Culture     Final   Value:        BLOOD CULTURE RECEIVED NO GROWTH TO DATE CULTURE WILL BE HELD FOR 5 DAYS BEFORE ISSUING A FINAL NEGATIVE REPORT   Report Status PENDING   Incomplete  WOUND CULTURE     Status: None   Collection Time    08/20/12 10:41 AM      Result Value Range Status   Specimen Description WOUND LEG LEFT   Final   Special Requests Immunocompromised   Final   Gram Stain     Final   Value: NO WBC SEEN     NO SQUAMOUS EPITHELIAL CELLS SEEN     RARE GRAM NEGATIVE RODS   Culture     Final   Value: ABUNDANT PROTEUS MIRABILIS     MODERATE ENTEROCOCCUS SPECIES   Report Status 08/24/2012 FINAL   Final   Organism ID, Bacteria PROTEUS MIRABILIS   Final   Organism ID, Bacteria ENTEROCOCCUS SPECIES   Final  SURGICAL PCR SCREEN      Status: None   Collection Time    08/23/12 11:01 PM      Result Value Range Status   MRSA, PCR NEGATIVE  NEGATIVE Final   Staphylococcus aureus NEGATIVE  NEGATIVE Final   Comment:  The Xpert SA Assay (FDA     approved for NASAL specimens     in patients over 17 years of age),     is one component of     a comprehensive surveillance     program.  Test performance has     been validated by [employer] for patients greater     than or equal to 51 year old.     It is not intended     to diagnose infection nor to     guide or monitor treatment.  ANAEROBIC CULTURE     Status: None   Collection Time    08/24/12  4:08 PM      Result Value Range Status   Specimen Description ABSCESS LEFT KNEE   Final   Special Requests PATIENT ON FOLLOWING VANCOMYCIN ZOSYN   Final   Gram Stain     Final   Value: NO WBC SEEN     NO SQUAMOUS EPITHELIAL CELLS SEEN     NO ORGANISMS SEEN   Culture     Final   Value: NO ANAEROBES ISOLATED; CULTURE IN PROGRESS FOR 5 DAYS   Report Status PENDING   Incomplete  CULTURE, ROUTINE-ABSCESS     Status: None   Collection Time    08/24/12  4:08 PM      Result Value Range Status   Specimen Description ABSCESS LEFT KNEE   Final   Special Requests PATIENT ON FOLLOWING VANCOMYCIN ZOSYN   Final   Gram Stain     Final   Value: NO WBC SEEN     NO SQUAMOUS EPITHELIAL CELLS SEEN     NO ORGANISMS SEEN   Culture NO GROWTH 1 DAY   Final   Report Status PENDING   Incomplete  TISSUE CULTURE     Status: None   Collection Time    08/24/12  4:08 PM      Result Value Range Status   Specimen Description TISSUE LEFT KNEE   Final   Special Requests PATIENT ON FOLLOWING VANCOMYCIN ZOSYN   Final   Gram Stain     Final   Value: NO WBC SEEN     NO SQUAMOUS EPITHELIAL CELLS SEEN     NO ORGANISMS SEEN   Culture NO GROWTH 1 DAY   Final   Report Status PENDING   Incomplete    Cliffton Asters, MD [facility] [phone] pager   [phone] cell 08/25/2012, 1:43 PM

## 2012-08-25 NOTE — Op Note (Signed)
NAMELUCIO, Chad [name] NO.:  [id number]  MEDICAL RECORD NO.:  [id number]  LOCATION:  6N20C                        FACILITY:  [facility]  PHYSICIAN:  Toni Arthurs, MD        DATE OF BIRTH:  1955/06/15  DATE OF PROCEDURE:  08/24/2012 DATE OF DISCHARGE:                              OPERATIVE REPORT   PREOPERATIVE DIAGNOSIS:  Left thigh wound dehiscence.  POSTOPERATIVE DIAGNOSES: 1. Left thigh wound dehiscence. 2. Left thigh seroma. 3. Left anteromedial knee abscess.  PROCEDURES: 1. Irrigation and excisional debridement of left thigh wound and     seroma. 2. Irrigation and excisional debridement of the left knee,     anteromedial abscess. 3. Application of wound VAC to the left knee (5 cm x 2 cm x 2 cm).  SURGEON:  Toni Arthurs, MD  ANESTHESIA:  General.  ESTIMATED BLOOD LOSS:  100 mL.  TOURNIQUET:  Zero.  COMPLICATIONS:  None apparent.  DISPOSITION:  Extubated, awake, and stable to recovery.  INDICATIONS FOR PROCEDURE:  The patient is a 57 year old male with past medical history significant for renal transplant, who presents with persistent drainage from his left thigh wound.  He is currently admitted for left leg cellulitis in an area where he has chronic lymphedema.  He is taking vancomycin and Zosyn.  He understands the risks and benefits, the alternative treatment options and elects surgical treatment.  He specifically understands the risks of bleeding, infection, nerve damage, blood clots, need for additional surgery, amputation, and death.  PROCEDURE IN DETAIL:  After preoperative consent was obtained, the correct operative site was identified.  The patient was brought to the operating room and placed on the operating table.  General anesthesia was induced.  Preoperative antibiotics were administered.  Surgical time- out was taken.  Left lower extremity was prepped and draped in standard sterile fashion.  The patient's previous surgical incision was  noted at the lateral aspect of the distal thigh.  There was an area in the center of this incision about a cm long with some serous drainage and fibrinous exudate.  This portion of the wound was excised with a #15 blade.  The area was probed and this was noted to communicate with a large seroma. Significant amount of serous fluid was expressed.  The incision was extended proximally and distally for about 5 inches.  The seroma communicated with the plate laterally, but did not appear to communicate with the knee joint.  All necrotic tissue was excised with a #15 blade rongeur and curettes.  The wound was then irrigated copiously with 6 L of normal saline using pulse lavage.  The patient was noted to have a fluctuant area at the anteromedial aspect of the knee.  Subcutaneous dissection was carried from the lateral incision over to this area.  Copious amount of dark thick bloody fluid was expressed.  This appeared to be hematoma and had no evidence of purulence.  The anteromedial aspect of the knee was incised over this area with a #15 blade.  This subcutaneous abscess was then sharply debrided of all necrotic tissue using a knife and rongeur as well as a curette.  Wound was then irrigated with  3 L of normal saline.  Attention was then returned to the lateral wound.  A JP drain was placed in the subcutaneous tissue after a #1 PDS was used to close the IT band over the plate.  Subcutaneous tissue was then approximated with inverted simple sutures of 2-0 PDS.  Skin was closed with a 2-0 nylon.  The drain was sewn in.  Attention was then returned to the anteromedial wound.  A wound VAC sponge was cut to fit this wound including the undermined areas.  The wound VAC occlusive dressing was applied followed by 125 mmHg vacuum. Sterile dressings were applied followed by a compression wrap from the toes to the hip.  The knee immobilizer was then applied.  The patient was then awakened by  Anesthesia and transported to the recovery room in stable condition.  Deep tissue had been sent as a specimen for microbiology, aerobic and anaerobic cultures.  FOLLOWUP PLAN:  The patient will be returned to [facility] under the care of the Hospitalist Service.  He will continue on IV vancomycin and Zosyn.  We will call Infectious Disease consultations to Dr. Luciana Axe.     Toni Arthurs, MD     JH/MEDQ  D:  08/24/2012  T:  08/25/2012  Job:  [id number]

## 2012-08-26 ENCOUNTER — Inpatient Hospital Stay (HOSPITAL_COMMUNITY): Payer: Medicaid Other

## 2012-08-26 ENCOUNTER — Encounter (HOSPITAL_COMMUNITY): Payer: Self-pay | Admitting: Radiology

## 2012-08-26 DIAGNOSIS — T889XXS Complication of surgical and medical care, unspecified, sequela: Secondary | ICD-10-CM

## 2012-08-26 DIAGNOSIS — B961 Klebsiella pneumoniae [K. pneumoniae] as the cause of diseases classified elsewhere: Secondary | ICD-10-CM

## 2012-08-26 DIAGNOSIS — B952 Enterococcus as the cause of diseases classified elsewhere: Secondary | ICD-10-CM

## 2012-08-26 LAB — CBC
Hemoglobin: 10.4 g/dL — ABNORMAL LOW (ref 13.0–17.0)
MCHC: 29.2 g/dL — ABNORMAL LOW (ref 30.0–36.0)
Platelets: 128 10*3/uL — ABNORMAL LOW (ref 150–400)
RDW: 17.9 % — ABNORMAL HIGH (ref 11.5–15.5)

## 2012-08-26 LAB — CULTURE, BLOOD (SINGLE): Culture: NO GROWTH

## 2012-08-26 LAB — BASIC METABOLIC PANEL
BUN: 43 mg/dL — ABNORMAL HIGH (ref 6–23)
Calcium: 8.5 mg/dL (ref 8.4–10.5)
Creatinine, Ser: 3.68 mg/dL — ABNORMAL HIGH (ref 0.50–1.35)
GFR calc Af Amer: 20 mL/min — ABNORMAL LOW (ref >90)
GFR calc non Af Amer: 17 mL/min — ABNORMAL LOW (ref >90)
Potassium: 3.4 mEq/L — ABNORMAL LOW (ref 3.5–5.1)

## 2012-08-26 MED ORDER — FUROSEMIDE 80 MG PO TABS
100.0000 mg | ORAL_TABLET | Freq: Two times a day (BID) | ORAL | Status: DC
  Administered 2012-08-27: 100 mg via ORAL
  Filled 2012-08-26 (×2): qty 1

## 2012-08-26 MED ORDER — SODIUM CHLORIDE 0.9 % IV SOLN
INTRAVENOUS | Status: DC
  Administered 2012-08-26: 08:00:00 via INTRAVENOUS

## 2012-08-26 MED ORDER — MIDAZOLAM HCL 2 MG/2ML IJ SOLN
INTRAMUSCULAR | Status: AC
  Filled 2012-08-26: qty 4

## 2012-08-26 MED ORDER — POTASSIUM CHLORIDE CRYS ER 20 MEQ PO TBCR
20.0000 meq | EXTENDED_RELEASE_TABLET | Freq: Once | ORAL | Status: AC
  Administered 2012-08-26: 20 meq via ORAL

## 2012-08-26 MED ORDER — FENTANYL CITRATE 0.05 MG/ML IJ SOLN
INTRAMUSCULAR | Status: AC | PRN
  Administered 2012-08-26 (×2): 50 ug via INTRAVENOUS

## 2012-08-26 MED ORDER — MIDAZOLAM HCL 2 MG/2ML IJ SOLN
INTRAMUSCULAR | Status: AC | PRN
  Administered 2012-08-26 (×2): 1 mg via INTRAVENOUS

## 2012-08-26 MED ORDER — FENTANYL CITRATE 0.05 MG/ML IJ SOLN
INTRAMUSCULAR | Status: AC
  Filled 2012-08-26: qty 4

## 2012-08-26 NOTE — Procedures (Signed)
Interventional Radiology Procedure Note  Procedure: placement of  Right IJ tunneled dual lumen Power-PICC.  Tip at superior cavoatrial junction and ready for use.   Complications:  None Recommendations: - Routine line care  Signed,  Sterling Big, MD Vascular & Interventional Radiologist [facility]

## 2012-08-26 NOTE — H&P (Signed)
Chad [name] is an 57 y.o. male.   Chief Complaint: left leg wound/ infection - post fracture- surgery Chronic kidney disease- transplant 1996 Pt needs long term antibiotic- use at home Now scheduled for Tunelled IJ PICC HPI: HTN; Hep C; CKD; HLD; CAD; CHF; A flutter; left leg fx- infection  Past Medical History  Diagnosis Date  . Hypertension   . Hepatitis C 1996  . CKD (chronic kidney disease), stage IV     a. s/p renal transplant 1996.  Marland Kitchen Pneumonia   . HLD (hyperlipidemia)   . Nonischemic cardiomyopathy     a. unknown etiology, EF 30-35% by echo 09/2011;  b. 09/2011 Lexi MV EF 42%, no ischemia/infarct.  . Systolic CHF   . Atrial flutter     a. in the setting of sepsis 05/2011; on amio; no anticoagulation    Past Surgical History  Procedure Laterality Date  . Nephrectomy transplanted organ    . Insertion of dialysis catheter  06/20/2011    Procedure: INSERTION OF DIALYSIS CATHETER;  Surgeon: [name], MD;  Location: [facility] OR;  Service: Vascular;  Laterality: Right;  Ultrasound guided insertion of right internal jugular dialysis catheter  . Multiple extractions with alveoloplasty  06/27/2011    Procedure: MULTIPLE EXTRACION WITH ALVEOLOPLASTY;  Surgeon: [name], DDS;  Location: [facility] OR;  Service: Oral Surgery;  Laterality: N/A;  Extraction  of tooth # 14 with alveoloplasty  . Hip surgery    . Joint replacement      left hip  . Femur fracture surgery Left 07/09/2012    Dr Deno Etienne  . Femur im nail Left 07/10/2012    Procedure: IM FEMORAL NAIL;  Surgeon: Toni Arthurs, MD;  Location: [facility] OR;  Service: Orthopedics;  Laterality: Left;    Family History  Problem Relation Age of Onset  . Heart disease Neg Hx    Social History:  reports that he has never smoked. He has never used smokeless tobacco. He reports that he does not drink alcohol or use illicit drugs.  Allergies: No Known Allergies  Medications Prior to Admission  Medication Sig Dispense Refill  . allopurinol  (ZYLOPRIM) 100 MG tablet Take 1 tablet (100 mg total) by mouth 2 (two) times daily.  60 tablet  1  . amiodarone (PACERONE) 200 MG tablet Take 1 tablet (200 mg total) by mouth daily.  30 tablet  1  . aspirin EC 81 MG tablet Take 1 tablet (81 mg total) by mouth daily.      . carvedilol (COREG) 25 MG tablet Take 1 tablet (25 mg total) by mouth 2 (two) times daily with a meal.  60 tablet  1  . furosemide (LASIX) 20 MG tablet Take 5 tablets (100 mg total) by mouth 2 (two) times daily.  60 tablet  1  . hydrALAZINE (APRESOLINE) 50 MG tablet Take 1 tablet (50 mg total) by mouth 3 (three) times daily.  90 tablet  1  . isosorbide mononitrate (IMDUR) 60 MG 24 hr tablet Take 1 tablet (60 mg total) by mouth daily.  30 tablet  2  . methocarbamol (ROBAXIN) 500 MG tablet Take 500 mg by mouth every 6 (six) hours as needed (For muscle pain).      Marland Kitchen omeprazole (PRILOSEC) 20 MG capsule Take 1 capsule (20 mg total) by mouth daily.  30 capsule  1  . oxyCODONE (OXY IR/ROXICODONE) 5 MG immediate release tablet Take 5-10 mg by mouth every 3 (three) hours as needed for pain.      Marland Kitchen  potassium chloride SA (K-DUR,KLOR-CON) 20 MEQ tablet Take 1 tablet (20 mEq total) by mouth daily.  30 tablet  1  . pravastatin (PRAVACHOL) 40 MG tablet Take 1 tablet (40 mg total) by mouth daily.  30 tablet  1  . predniSONE (DELTASONE) 10 MG tablet Take 1 tablet (10 mg total) by mouth daily.  30 tablet  1  . sirolimus (RAPAMUNE) 1 MG/ML solution Take 1.2 mg by mouth daily.        Results for orders placed during the hospital encounter of 08/19/12 (from the past 48 hour(s))  ANAEROBIC CULTURE     Status: None   Collection Time    08/24/12  4:08 PM      Result Value Range   Specimen Description ABSCESS LEFT KNEE     Special Requests PATIENT ON FOLLOWING VANCOMYCIN ZOSYN     Gram Stain       Value: NO WBC SEEN     NO SQUAMOUS EPITHELIAL CELLS SEEN     NO ORGANISMS SEEN   Culture       Value: NO ANAEROBES ISOLATED; CULTURE IN PROGRESS FOR 5  DAYS   Report Status PENDING    CULTURE, ROUTINE-ABSCESS     Status: None   Collection Time    08/24/12  4:08 PM      Result Value Range   Specimen Description ABSCESS LEFT KNEE     Special Requests PATIENT ON FOLLOWING VANCOMYCIN ZOSYN     Gram Stain       Value: NO WBC SEEN     NO SQUAMOUS EPITHELIAL CELLS SEEN     NO ORGANISMS SEEN   Culture NO GROWTH 2 DAYS     Report Status PENDING    TISSUE CULTURE     Status: None   Collection Time    08/24/12  4:08 PM      Result Value Range   Specimen Description TISSUE LEFT KNEE     Special Requests PATIENT ON FOLLOWING VANCOMYCIN ZOSYN     Gram Stain       Value: NO WBC SEEN     NO SQUAMOUS EPITHELIAL CELLS SEEN     NO ORGANISMS SEEN   Culture NO GROWTH 2 DAYS     Report Status PENDING    BASIC METABOLIC PANEL     Status: Abnormal   Collection Time    08/25/12  5:05 AM      Result Value Range   Sodium 140  135 - 145 mEq/L   Potassium 3.6  3.5 - 5.1 mEq/L   Chloride 99  96 - 112 mEq/L   CO2 30  19 - 32 mEq/L   Glucose, Bld 125 (*) 70 - 99 mg/dL   BUN 48 (*) 6 - 23 mg/dL   Creatinine, Ser 1.61 (*) 0.50 - 1.35 mg/dL   Calcium 8.5  8.4 - 09.6 mg/dL   GFR calc non Af Amer 18 (*) >90 mL/min   GFR calc Af Amer 21 (*) >90 mL/min   Comment:            The eGFR has been calculated     using the CKD EPI equation.     This calculation has not been     validated in all clinical     situations.     eGFR's persistently     <90 mL/min signify     possible Chronic Kidney Disease.  VANCOMYCIN, TROUGH     Status: Abnormal   Collection Time  08/25/12  4:53 PM      Result Value Range   Vancomycin Tr 25.8 (*) 10.0 - 20.0 ug/mL   Comment: CRITICAL RESULT CALLED TO, READ BACK BY AND VERIFIED WITH:     Alcide Goodness 1750 08/25/12 D BRADLEY  BASIC METABOLIC PANEL     Status: Abnormal   Collection Time    08/26/12  5:05 AM      Result Value Range   Sodium 141  135 - 145 mEq/L   Potassium 3.4 (*) 3.5 - 5.1 mEq/L   Chloride 102  96 - 112  mEq/L   CO2 29  19 - 32 mEq/L   Glucose, Bld 96  70 - 99 mg/dL   BUN 43 (*) 6 - 23 mg/dL   Creatinine, Ser 4.09 (*) 0.50 - 1.35 mg/dL   Calcium 8.5  8.4 - 81.1 mg/dL   GFR calc non Af Amer 17 (*) >90 mL/min   GFR calc Af Amer 20 (*) >90 mL/min   Comment:            The eGFR has been calculated     using the CKD EPI equation.     This calculation has not been     validated in all clinical     situations.     eGFR's persistently     <90 mL/min signify     possible Chronic Kidney Disease.  CBC     Status: Abnormal   Collection Time    08/26/12  5:05 AM      Result Value Range   WBC 9.5  4.0 - 10.5 K/uL   RBC 3.89 (*) 4.22 - 5.81 MIL/uL   Hemoglobin 10.4 (*) 13.0 - 17.0 g/dL   HCT 91.4 (*) 78.2 - 95.6 %   MCV 91.5  78.0 - 100.0 fL   MCH 26.7  26.0 - 34.0 pg   MCHC 29.2 (*) 30.0 - 36.0 g/dL   RDW 21.3 (*) 08.6 - 57.8 %   Platelets 128 (*) 150 - 400 K/uL  CLOSTRIDIUM DIFFICILE BY PCR     Status: None   Collection Time    08/26/12  9:53 AM      Result Value Range   C difficile by pcr NEGATIVE  NEGATIVE   No results found.  Review of Systems  Constitutional: Negative for fever.  Cardiovascular: Negative for chest pain.  Gastrointestinal: Negative for nausea, vomiting and abdominal pain.  Musculoskeletal:       Left leg wound  Neurological: Positive for weakness. Negative for headaches.    Blood pressure 143/76, pulse 83, temperature 98.5 F (36.9 C), temperature source Oral, resp. rate 20, height 6' 0.84" (1.85 m), weight 271 lb 6.2 oz (123.1 kg), SpO2 92.00%. Physical Exam  Constitutional: He is oriented to person, place, and time. He appears well-developed.  Cardiovascular: Normal rate, regular rhythm and normal heart sounds.   No murmur heard. Respiratory: Effort normal and breath sounds normal. He has no wheezes.  GI: Soft. Bowel sounds are normal. There is no tenderness.  Musculoskeletal:  Left leg wound; post fx  Neurological: He is alert and oriented to person,  place, and time.  Psychiatric: He has a normal mood and affect. His behavior is normal. Thought content normal.     Assessment/Plan Left leg fx; surgery- post infection Need for ;ong term at home antibx CKD- renal tx 1996 Scheduled for Tunelled IJ PICC Pt aware of procedure benefits and risks and agreeable to proceed Consent signed and in  chart  Javonna Balli A 08/26/2012, 12:14 PM

## 2012-08-26 NOTE — Progress Notes (Signed)
PT Cancellation Note  Patient Details Name: Chad [name] MRN: [id number] DOB: [birth year]   Cancelled Treatment:    Reason Treat Not Completed: Pt now getting PICC line in IR.   Chad [name] 08/26/2012, 2:55 PM Pager [phone]

## 2012-08-26 NOTE — Progress Notes (Signed)
Orthopedic Tech Progress Note Patient Details:  GUILHERME SCHWENKE 1955/02/18 [id number]  Patient ID: Ollen Barges, male   DOB: 06-25-1955, 57 y.o.   MRN: [id number]   Shawnie Pons 08/26/2012, 12:05 PMCalled [employer]-tech for left long leg ROM brace locked at neutral.

## 2012-08-26 NOTE — Progress Notes (Signed)
[facility]  Day 8 vancomycin  Day 8 piperacillin tazobactam   Subjective: No new complaints   Antibiotics:  Anti-infectives   Start     Dose/Rate Route Frequency Ordered Stop   08/26/12 1800  vancomycin (VANCOCIN) 1,500 mg in sodium chloride 0.9 % 500 mL IVPB     1,500 mg 250 mL/hr over 120 Minutes Intravenous every 72 hours 08/25/12 1900     08/21/12 1800  vancomycin (VANCOCIN) 1,500 mg in sodium chloride 0.9 % 500 mL IVPB  Status:  Discontinued     1,500 mg 250 mL/hr over 120 Minutes Intravenous Every 48 hours 08/19/12 1933 08/25/12 1753   08/20/12 0600  piperacillin-tazobactam (ZOSYN) IVPB 3.375 g     3.375 g 12.5 mL/hr over 240 Minutes Intravenous 3 times per day 08/20/12 0106     08/20/12 0500  ciprofloxacin (CIPRO) IVPB 400 mg  Status:  Discontinued     400 mg 200 mL/hr over 60 Minutes Intravenous Every 24 hours 08/19/12 2350 08/20/12 0105   08/19/12 1700  piperacillin-tazobactam (ZOSYN) IVPB 3.375 g  Status:  Discontinued     3.375 g 12.5 mL/hr over 240 Minutes Intravenous  Once 08/19/12 1646 08/19/12 1647   08/19/12 1700  piperacillin-tazobactam (ZOSYN) IVPB 3.375 g     3.375 g 100 mL/hr over 30 Minutes Intravenous  Once 08/19/12 1648 08/19/12 2024   08/19/12 1700  vancomycin (VANCOCIN) 2,500 mg in sodium chloride 0.9 % 500 mL IVPB     2,500 mg 250 mL/hr over 120 Minutes Intravenous To Emergency Dept 08/19/12 1650 08/19/12 1949      Medications: Scheduled Meds: . allopurinol  100 mg Oral BID  . amiodarone  200 mg Oral Daily  . aspirin EC  81 mg Oral Daily  . carvedilol  25 mg Oral BID WC  . [START ON 08/27/2012] furosemide  100 mg Oral BID  . heparin subcutaneous  5,000 Units Subcutaneous Q8H  . hydrALAZINE  50 mg Oral Q8H  . isosorbide mononitrate  60 mg Oral Daily  . pantoprazole  40 mg Oral Daily  . piperacillin-tazobactam (ZOSYN)  IV  3.375 g Intravenous Q8H  . potassium chloride SA  20 mEq Oral Daily  . predniSONE  10 mg  Oral Q breakfast  . simvastatin  20 mg Oral q1800  . sirolimus  1.2 mg Oral Daily  . sodium chloride  3 mL Intravenous Q12H  . vancomycin  1,500 mg Intravenous Q72H   Continuous Infusions: . sodium chloride 75 mL/hr at 08/26/12 0809   PRN Meds:.acetaminophen, acetaminophen, HYDROmorphone (DILAUDID) injection, meperidine (DEMEROL) injection, methocarbamol, ondansetron (ZOFRAN) IV, ondansetron, oxyCODONE, promethazine   Objective: Weight change:   Intake/Output Summary (Last 24 hours) at 08/26/12 1052 Last data filed at 08/26/12 0900  Gross per 24 hour  Intake 2001.67 ml  Output   1795 ml  Net 206.67 ml   Blood pressure 143/76, pulse 83, temperature 98.5 F (36.9 C), temperature source Oral, resp. rate 20, height 6' 0.84" (1.85 m), weight 271 lb 6.2 oz (123.1 kg), SpO2 92.00%. Temp:  [98.5 F (36.9 C)-98.7 F (37.1 C)] 98.5 F (36.9 C) (07/31 0500) Pulse Rate:  [77-83] 83 (07/31 0500) Resp:  [20] 20 (07/31 0500) BP: (130-143)/(74-76) 143/76 mmHg (07/31 0500) SpO2:  [92 %-98 %] 92 % (07/31 0500)  Physical Exam: General: Alert and awake, oriented x3, not in any acute distress. HEENT: EOMI CVS regular rate, normal r,  no murmur rubs or gallops Chest: clear to auscultation  bilaterally, no wheezing, rales or rhonchi Abdomen: Healed incision right lower quadrant.  Left leg and bulky Ace wrap    Lab Results:  Recent Labs  08/26/12 0505  WBC 9.5  HGB 10.4*  HCT 35.6*  PLT 128*    BMET  Recent Labs  08/25/12 0505 08/26/12 0505  NA 140 141  K 3.6 3.4*  CL 99 102  CO2 30 29  GLUCOSE 125* 96  BUN 48* 43*  CREATININE 3.58* 3.68*  CALCIUM 8.5 8.5    Micro Results: Recent Results (from the past 240 hour(s))  CULTURE, BLOOD (ROUTINE X 2)     Status: None   Collection Time    08/19/12  5:15 PM      Result Value Range Status   Specimen Description BLOOD ARM RIGHT   Final   Special Requests BOTTLES DRAWN AEROBIC ONLY [facility]   Final   Culture  Setup Time 08/19/2012  22:47   Final   Culture NO GROWTH 5 DAYS   Final   Report Status 08/25/2012 FINAL   Final  CULTURE, BLOOD (SINGLE)     Status: None   Collection Time    08/19/12  7:35 PM      Result Value Range Status   Specimen Description BLOOD ARM LEFT   Final   Special Requests BOTTLES DRAWN AEROBIC AND ANAEROBIC 10CC   Final   Culture  Setup Time 08/20/2012 03:45   Final   Culture     Final   Value:        BLOOD CULTURE RECEIVED NO GROWTH TO DATE CULTURE WILL BE HELD FOR 5 DAYS BEFORE ISSUING A FINAL NEGATIVE REPORT   Report Status PENDING   Incomplete  WOUND CULTURE     Status: None   Collection Time    08/20/12 10:41 AM      Result Value Range Status   Specimen Description WOUND LEG LEFT   Final   Special Requests Immunocompromised   Final   Gram Stain     Final   Value: NO WBC SEEN     NO SQUAMOUS EPITHELIAL CELLS SEEN     RARE GRAM NEGATIVE RODS   Culture     Final   Value: ABUNDANT PROTEUS MIRABILIS     MODERATE ENTEROCOCCUS SPECIES   Report Status 08/24/2012 FINAL   Final   Organism ID, Bacteria PROTEUS MIRABILIS   Final   Organism ID, Bacteria ENTEROCOCCUS SPECIES   Final  SURGICAL PCR SCREEN     Status: None   Collection Time    08/23/12 11:01 PM      Result Value Range Status   MRSA, PCR NEGATIVE  NEGATIVE Final   Staphylococcus aureus NEGATIVE  NEGATIVE Final   Comment:            The Xpert SA Assay (FDA     approved for NASAL specimens     in patients over 72 years of age),     is one component of     a comprehensive surveillance     program.  Test performance has     been validated by [employer] for patients greater     than or equal to 74 year old.     It is not intended     to diagnose infection nor to     guide or monitor treatment.  ANAEROBIC CULTURE     Status: None   Collection Time    08/24/12  4:08 PM  Result Value Range Status   Specimen Description ABSCESS LEFT KNEE   Final   Special Requests PATIENT ON FOLLOWING VANCOMYCIN ZOSYN   Final    Gram Stain     Final   Value: NO WBC SEEN     NO SQUAMOUS EPITHELIAL CELLS SEEN     NO ORGANISMS SEEN   Culture     Final   Value: NO ANAEROBES ISOLATED; CULTURE IN PROGRESS FOR 5 DAYS   Report Status PENDING   Incomplete  CULTURE, ROUTINE-ABSCESS     Status: None   Collection Time    08/24/12  4:08 PM      Result Value Range Status   Specimen Description ABSCESS LEFT KNEE   Final   Special Requests PATIENT ON FOLLOWING VANCOMYCIN ZOSYN   Final   Gram Stain     Final   Value: NO WBC SEEN     NO SQUAMOUS EPITHELIAL CELLS SEEN     NO ORGANISMS SEEN   Culture NO GROWTH 2 DAYS   Final   Report Status PENDING   Incomplete  TISSUE CULTURE     Status: None   Collection Time    08/24/12  4:08 PM      Result Value Range Status   Specimen Description TISSUE LEFT KNEE   Final   Special Requests PATIENT ON FOLLOWING VANCOMYCIN ZOSYN   Final   Gram Stain     Final   Value: NO WBC SEEN     NO SQUAMOUS EPITHELIAL CELLS SEEN     NO ORGANISMS SEEN   Culture NO GROWTH 2 DAYS   Final   Report Status PENDING   Incomplete    Studies/Results: No results found.    Assessment/Plan: Chad [name] is a 57 y.o. male with  Left THA fell and distal femur fracture, sp ORIF now anteromedial abscess sp I and D by Dr. Rowe Pavy and I and D of "seroma" that extneded to hardware and concerning for deep infection . Superficial cultures grew AMp S Enterococcus and Klebsiella PNA   #1 Presumed deep hardware infection and postop wound infection after ORIF sp I and D  Vancomycin and zosyn are reasonable for now. Certainly his superficial pathogens could be covered with UNASYn alone. Would want to include vancomycin given pretest prob for MRSA or COag Neg staph involvment deeper. I will consider abx changes as cultures from deep come back and as we consider dosing frequency and cost  #2 Hep C: check Hep C genotype   LOS: 7 days   Acey Lav 08/26/2012, 10:52 AM

## 2012-08-26 NOTE — Progress Notes (Signed)
Orthopedic Tech Progress Note Patient Details:  Chad [name] [birth year] [id number] Brace order completed by bio-tech vendor. Patient ID: Chad [name], male   DOB: 02-Feb-1955, 57 y.o.   MRN: [id number]   Chad [name] 08/26/2012, 6:23 PM

## 2012-08-26 NOTE — Progress Notes (Signed)
PT Cancellation Note  Patient Details Name: Chad [name] MRN: [id number] DOB: [birth year]   Cancelled Treatment:    Reason Treat Not Completed: Pt reports he just got back to bed as he was told his VAC was going to be changed at 11:00 a.m. Spoke with Dr. [name] re: ROM to Lt knee and he agrees we need to hold ROM at this time.  Will attempt to see later today after VAC change.  Henya Aguallo 08/26/2012, 10:57 AM Pager [phone]

## 2012-08-26 NOTE — Progress Notes (Signed)
Orthopedic Tech Progress Note Patient Details:  Chad [name] March 06, 1955 [id number]  Ortho Devices Type of Ortho Device: Roland Rack boot Ortho Device/Splint Interventions: Application   Shawnie Pons 08/26/2012, 3:22 PM

## 2012-08-26 NOTE — Progress Notes (Signed)
Subjective: 2 Days Post-Op Procedure(s) (LRB): LEFT LEG IRRIGATION AND DEBRIDEMENT WITH POSSIBLE WOUND VAC APPLICATION (Left) Patient reports pain as mild.    Objective: Vital signs in last 24 hours: Temp:  [98.5 F (36.9 C)-98.7 F (37.1 C)] 98.5 F (36.9 C) (07/31 0500) Pulse Rate:  [77-83] 83 (07/31 0500) Resp:  [20] 20 (07/31 0500) BP: (130-143)/(74-76) 143/76 mmHg (07/31 0500) SpO2:  [92 %-98 %] 92 % (07/31 0500)  Intake/Output from previous day: 07/30 0701 - 07/31 0700 In: 700 [P.O.:700] Out: 1695 [Urine:1650; Drains:45] Intake/Output this shift: Total I/O In: 1651.7 [I.V.:1651.7] Out: 100 [Urine:100]   Recent Labs  08/26/12 0505  HGB 10.4*    Recent Labs  08/26/12 0505  WBC 9.5  RBC 3.89*  HCT 35.6*  PLT 128*    Recent Labs  08/25/12 0505 08/26/12 0505  NA 140 141  K 3.6 3.4*  CL 99 102  CO2 30 29  BUN 48* 43*  CREATININE 3.58* 3.68*  GLUCOSE 125* 96  CALCIUM 8.5 8.5   No results found for this basename: LABPT, INR,  in the last 72 hours  PE:  wound VAC dressing removed.  No purulence.  No signs of infection.  Lateral thigh incision healing.  Serosang drainage in reservoir.  Deep cxs: NGTD  Wound culture from admission grew enterococcus.  Assessment/Plan: 2 Days Post-Op Procedure(s) (LRB): LEFT LEG IRRIGATION AND DEBRIDEMENT WITH POSSIBLE WOUND VAC APPLICATION (Left) NPO for IJ PICC line.  Continue NWB.  Wound VAC change again Sat.  Leave drain and plan to remove tomorrow.  Hopefully home Monday or less likely Saturday.  Toni Arthurs 08/26/2012, 11:17 AM

## 2012-08-26 NOTE — Clinical Social Work Note (Signed)
Clinical Social Work received inappropriate referral for SNF placement. CSW reviewed chart and patient is planned for home health services at discharge. CSW will sign off, as social work intervention is no longer needed.   Rozetta Nunnery MSW, [employer] [phone]

## 2012-08-26 NOTE — Consult Note (Signed)
WOC consult Note Reason for Consult: Vac dressing change with Dr [name] present to assess wound for first post-op dressing change. Ortho service following for assessment and plan of care. Wound type: Full thickness post-op wound to left knee Measurement:3X2X2cm, undermining circumferentially to 2 cm Wound bed: 100% beefy red Drainage (amount, consistency, odor) Mod pink drainage in cannister, no odor. Periwound: Intact skin surrounding Dressing procedure/placement/frequency: Pt medicated prior to procedure.  Tolerated dressing change with minimal discomfort.  One piece black foam applied to cont suction.  Plan for bedside nurse to change Q Tues/Thurs/Sat.  Pt wears Una boot to left leg prior to admission.  Previously had stasis ulcer to posterior leg but no wound or drainage noted at this time. He prefers to continue use of Elveria Rising to control edema to left leg.  Left foot is currently extremely swollen.  Ortho tech called to apply [employer] and coban to left leg and change Q Thursday.  Left leg splint can be re-applied over vac dressing and Una boot after it is applied.   Please re-consult if further assistance is needed.  Thank-you,  Cammie Mcgee MSN, RN, CWOCN, [place], CNS [phone]

## 2012-08-26 NOTE — Progress Notes (Signed)
[name]                                                                                Patient Demographics  Chad [name], is a 57 y.o. male, DOB - Nov 24, 1955, ZOX:[id number], WJX:[id number]  Admit date - 08/19/2012  Admitting Physician Kathlen Mody, MD  Outpatient Primary MD for the patient is Chad Girt, MD  LOS - 7   Patient's care transferred to Dr. Toni Arthurs as attending on 08/26/2012 after discussions with him, we will continue to follow the patient daily for his medical problems.   Chief Complaint  Patient presents with  . Leg Swelling        Assessment & Plan    1. Postoperative wound infection of the left leg after initial ORIF of the left femur 4 weeks ago. He is currently on broad-spectrum antibiotics, status post incision and drainage by Dr. [name] on 08/24/2012, ID has been consulted for recommendations on long-term antibiotics. We'll resume PT, IR consulted for IJ PICC line to be placed, will also order home health versus nursing home placement evaluation, in my opinion due to complex wound care, wound VAC, IV antibiotics patient should have gone to a monitored setting like SNIF vs LTAC, however patient himself chooses to go home with home health. Also Dr. [name] okay for home discharge with home health followup.     2. 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 Last EF 35% on echogram done in December 2013 long with grade 2 diastolic dysfunction- he remains compensated from the standpoint, he is on hydralazine, Coreg, Lasix at home dose. Along with aspirin and amiodarone. Not Lasix a.m. dose on 08/26/2012 will be skipped as his by mouth intake on 08/25/2012 during his incision and drainage was low.     3. History of paroxysmal atrial fibrillation patient on amiodarone along with Coreg which will be continued. Also on home dose 81 mg aspirin.      4. Dyslipidemia on statin which is being  continued.      5. History of renal transplant and chronic kidney disease stage V. Creatinine at baseline (4.4 to 4.6) , continue his home immunosuppressants unchanged.      6.H/O Hep C - stable.       Code Status: full  Family Communication: none present  Disposition Plan: TBD   Procedures x-ray left to be a fibula, chest x-ray, I&D of the left leg by Dr. [name]   Consults  orthopedics and ID   DVT Prophylaxis  Heparin    Lab Results  Component Value Date   PLT 128* 08/26/2012    Medications  Scheduled Meds: . allopurinol  100 mg Oral BID  . amiodarone  200 mg Oral Daily  . aspirin EC  81 mg Oral Daily  . carvedilol  25 mg Oral BID WC  . [START ON 08/27/2012] furosemide  100 mg Oral BID  . heparin subcutaneous  5,000 Units Subcutaneous Q8H  . hydrALAZINE  50 mg Oral Q8H  . isosorbide mononitrate  60 mg Oral Daily  . pantoprazole  40 mg Oral Daily  . piperacillin-tazobactam (ZOSYN)  IV  3.375 g Intravenous Q8H  .  potassium chloride SA  20 mEq Oral Daily  . predniSONE  10 mg Oral Q breakfast  . simvastatin  20 mg Oral q1800  . sirolimus  1.2 mg Oral Daily  . sodium chloride  3 mL Intravenous Q12H  . vancomycin  1,500 mg Intravenous Q72H   Continuous Infusions: . sodium chloride 75 mL/hr at 08/26/12 0809   PRN Meds:.acetaminophen, acetaminophen, HYDROmorphone (DILAUDID) injection, meperidine (DEMEROL) injection, methocarbamol, ondansetron (ZOFRAN) IV, ondansetron, oxyCODONE, promethazine  Antibiotics    Anti-infectives   Start     Dose/Rate Route Frequency Ordered Stop   08/26/12 1800  vancomycin (VANCOCIN) 1,500 mg in sodium chloride 0.9 % 500 mL IVPB     1,500 mg 250 mL/hr over 120 Minutes Intravenous every 72 hours 08/25/12 1900     08/21/12 1800  vancomycin (VANCOCIN) 1,500 mg in sodium chloride 0.9 % 500 mL IVPB  Status:  Discontinued     1,500 mg 250 mL/hr over 120 Minutes Intravenous Every 48 hours 08/19/12 1933 08/25/12 1753   08/20/12 0600   piperacillin-tazobactam (ZOSYN) IVPB 3.375 g     3.375 g 12.5 mL/hr over 240 Minutes Intravenous 3 times per day 08/20/12 0106     08/20/12 0500  ciprofloxacin (CIPRO) IVPB 400 mg  Status:  Discontinued     400 mg 200 mL/hr over 60 Minutes Intravenous Every 24 hours 08/19/12 2350 08/20/12 0105   08/19/12 1700  piperacillin-tazobactam (ZOSYN) IVPB 3.375 g  Status:  Discontinued     3.375 g 12.5 mL/hr over 240 Minutes Intravenous  Once 08/19/12 1646 08/19/12 1647   08/19/12 1700  piperacillin-tazobactam (ZOSYN) IVPB 3.375 g     3.375 g 100 mL/hr over 30 Minutes Intravenous  Once 08/19/12 1648 08/19/12 2024   08/19/12 1700  vancomycin (VANCOCIN) 2,500 mg in sodium chloride 0.9 % 500 mL IVPB     2,500 mg 250 mL/hr over 120 Minutes Intravenous To Emergency Dept 08/19/12 1650 08/19/12 1949       Time Spent in minutes   35   Devanshi Califf K M.D on 08/26/2012 at 10:05 AM  Between 7am to 7pm - Pager - [phone]  After 7pm go to [email or web address] - password [email or web address]  And look for the night coverage person covering for me after hours  [facility] Office  [phone]    Subjective:   [name] today has, No headache, No chest pain, No abdominal pain - No Nausea, No new weakness tingling or numbness, No Cough - SOB.    Objective:   Filed Vitals:   08/25/12 0500 08/25/12 0531 08/25/12 1300 08/26/12 0500  BP:  131/69 130/74 143/76  Pulse:  86 77 83  Temp:  98.8 F (37.1 C) 98.7 F (37.1 C) 98.5 F (36.9 C)  TempSrc:  Oral Oral Oral  Resp:  18 20 20   Height:      Weight: 123.1 kg (271 lb 6.2 oz) 123.1 kg (271 lb 6.2 oz)    SpO2:  95% 98% 92%    Wt Readings from Last 3 Encounters:  08/25/12 123.1 kg (271 lb 6.2 oz)  08/25/12 123.1 kg (271 lb 6.2 oz)  08/05/12 129.7 kg (285 lb 15 oz)     Intake/Output Summary (Last 24 hours) at 08/26/12 1005 Last data filed at 08/26/12 0900  Gross per 24 hour  Intake 2001.67 ml  Output   1795 ml  Net 206.67 ml     Exam Awake Alert, Oriented X 3, No new F.N deficits, Normal affect [place].AT,PERRAL Supple Neck,No  JVD, No cervical lymphadenopathy appriciated.  Symmetrical Chest wall movement, Good air movement bilaterally, CTAB RRR,No Gallops,Rubs or new Murmurs, No Parasternal Heave +ve B.Sounds, Abd Soft, Non tender, No organomegaly appriciated, No rebound - guarding or rigidity. No Cyanosis, Clubbing or edema, No new Rash or bruise , left leg in bandage and splint  Data Review   Micro Results Recent Results (from the past 240 hour(s))  CULTURE, BLOOD (ROUTINE X 2)     Status: None   Collection Time    08/19/12  5:15 PM      Result Value Range Status   Specimen Description BLOOD ARM RIGHT   Final   Special Requests BOTTLES DRAWN AEROBIC ONLY [facility]   Final   Culture  Setup Time 08/19/2012 22:47   Final   Culture NO GROWTH 5 DAYS   Final   Report Status 08/25/2012 FINAL   Final  CULTURE, BLOOD (SINGLE)     Status: None   Collection Time    08/19/12  7:35 PM      Result Value Range Status   Specimen Description BLOOD ARM LEFT   Final   Special Requests BOTTLES DRAWN AEROBIC AND ANAEROBIC 10CC   Final   Culture  Setup Time 08/20/2012 03:45   Final   Culture     Final   Value:        BLOOD CULTURE RECEIVED NO GROWTH TO DATE CULTURE WILL BE HELD FOR 5 DAYS BEFORE ISSUING A FINAL NEGATIVE REPORT   Report Status PENDING   Incomplete  WOUND CULTURE     Status: None   Collection Time    08/20/12 10:41 AM      Result Value Range Status   Specimen Description WOUND LEG LEFT   Final   Special Requests Immunocompromised   Final   Gram Stain     Final   Value: NO WBC SEEN     NO SQUAMOUS EPITHELIAL CELLS SEEN     RARE GRAM NEGATIVE RODS   Culture     Final   Value: ABUNDANT PROTEUS MIRABILIS     MODERATE ENTEROCOCCUS SPECIES   Report Status 08/24/2012 FINAL   Final   Organism ID, Bacteria PROTEUS MIRABILIS   Final   Organism ID, Bacteria ENTEROCOCCUS SPECIES   Final  SURGICAL PCR SCREEN      Status: None   Collection Time    08/23/12 11:01 PM      Result Value Range Status   MRSA, PCR NEGATIVE  NEGATIVE Final   Staphylococcus aureus NEGATIVE  NEGATIVE Final   Comment:            The Xpert SA Assay (FDA     approved for NASAL specimens     in patients over 39 years of age),     is one component of     a comprehensive surveillance     program.  Test performance has     been validated by [employer] for patients greater     than or equal to 49 year old.     It is not intended     to diagnose infection nor to     guide or monitor treatment.  ANAEROBIC CULTURE     Status: None   Collection Time    08/24/12  4:08 PM      Result Value Range Status   Specimen Description ABSCESS LEFT KNEE   Final   Special Requests PATIENT ON FOLLOWING VANCOMYCIN ZOSYN  Final   Gram Stain     Final   Value: NO WBC SEEN     NO SQUAMOUS EPITHELIAL CELLS SEEN     NO ORGANISMS SEEN   Culture     Final   Value: NO ANAEROBES ISOLATED; CULTURE IN PROGRESS FOR 5 DAYS   Report Status PENDING   Incomplete  CULTURE, ROUTINE-ABSCESS     Status: None   Collection Time    08/24/12  4:08 PM      Result Value Range Status   Specimen Description ABSCESS LEFT KNEE   Final   Special Requests PATIENT ON FOLLOWING VANCOMYCIN ZOSYN   Final   Gram Stain     Final   Value: NO WBC SEEN     NO SQUAMOUS EPITHELIAL CELLS SEEN     NO ORGANISMS SEEN   Culture NO GROWTH 2 DAYS   Final   Report Status PENDING   Incomplete  TISSUE CULTURE     Status: None   Collection Time    08/24/12  4:08 PM      Result Value Range Status   Specimen Description TISSUE LEFT KNEE   Final   Special Requests PATIENT ON FOLLOWING VANCOMYCIN ZOSYN   Final   Gram Stain     Final   Value: NO WBC SEEN     NO SQUAMOUS EPITHELIAL CELLS SEEN     NO ORGANISMS SEEN   Culture NO GROWTH 2 DAYS   Final   Report Status PENDING   Incomplete    Radiology Reports Dg Chest 2 View  07/29/2012   *RADIOLOGY REPORT*  Clinical Data:  Shortness of breath, postop left leg surgery  CHEST - 2 VIEW  Comparison: Portable chest x-ray of 07/11/2012  Findings: The lungs are not optimally aerated with some elevation of the right hemidiaphragm and mild right basilar atelectasis. Moderate cardiomegaly is stable.  Resolution is limited due to body habitus.  IMPRESSION: Suboptimal aeration with elevation of the right hemidiaphragm. Stable moderate cardiomegaly.   Original Report Authenticated By: Dwyane Dee, M.D.   Dg Femur Left  08/20/2012   *RADIOLOGY REPORT*  Clinical Data: Swelling, redness, and pain starting around the left ankle and extending  up the left leg.  Recent fracture and surgery.  LEFT FEMUR - 2 VIEW  Comparison: 07/16/2012  Findings: Postoperative changes with lateral plate and screw fixation of a comminuted fracture of the distal left femoral metaphysis.  Fracture fragments appears stable in position since previous study.  Hardware components appear intact without change in position.  There is some loss of distinction of the fracture line and periosteal reaction suggesting healing.  No focal bone lesion or bone destruction is appreciated.  No cortical destruction or erosion. Soft tissue swelling distal to the patella may represent hematoma or edema.  Small left knee effusion.  No radiopaque soft tissue foreign body or soft tissue gas collections are demonstrated.  Vascular calcifications.  Left hip arthroplasty.  IMPRESSION: No specific evidence of osteomyelitis.  Plate and screw fixation of a healing comminuted transverse fracture of the distal left femoral metaphysis.   Original Report Authenticated By: Burman Nieves, M.D.   Dg Tibia/fibula Left  08/20/2012   *RADIOLOGY REPORT*  Clinical Data: Swelling, redness, and pain starting around the left ankle and extending proximally of the left leg.  Recent fracture and surgery to the distal femur 07/10/2012.  LEFT TIBIA AND FIBULA - 2 VIEW  Comparison: Left femur 07/16/2012  Findings: AP  views of the left tibia  and fibula are obtained. There is focal periosteal reaction along the distal aspect of the left tibia and less promptly along the metaphyseal region of the distal tibia.  These changes could be due to healing nondisplaced fractures or stress fractures.  Osteomyelitis less likely.  There is no evidence of any cortical destruction or bone lucency.  No radiopaque soft tissue foreign bodies or soft tissue gas collections.  IMPRESSION: Periosteal reaction is demonstrated along the distal left tibia and fibula may represent healing fractures or stress fractures.  No specific evidence of osteomyelitis.  No soft tissue gas collections.   Original Report Authenticated By: Burman Nieves, M.D.    CBC  Recent Labs Lab 08/19/12 1646 08/20/12 0440 08/21/12 0510 08/26/12 0505  WBC 11.4* 9.7 9.5 9.5  HGB 12.9* 11.4* 11.2* 10.4*  HCT 41.9 38.8* 37.1* 35.6*  PLT 179 156 154 128*  MCV 88.2 90.2 90.3 91.5  MCH 27.2 26.5 27.3 26.7  MCHC 30.8 29.4* 30.2 29.2*  RDW 17.7* 17.6* 17.5* 17.9*  LYMPHSABS 1.1 1.3 1.3  --   MONOABS 0.6 1.3* 1.1*  --   EOSABS 0.2 0.2 0.2  --   BASOSABS 0.0 0.1 0.1  --     Chemistries   Recent Labs Lab 08/20/12 0440 08/21/12 0510 08/22/12 0852 08/23/12 0540 08/24/12 0450 08/25/12 0505 08/26/12 0505  NA 139 138 137 139 139 140 141  K 3.5 3.8 3.6 3.5 3.5 3.6 3.4*  CL 95* 96 96 97 99 99 102  CO2 30 31 29 29 30 30 29   GLUCOSE 101* 110* 102* 96 92 125* 96  BUN 53* 53* 49* 50* 51* 48* 43*  CREATININE 3.18* 3.40* 3.59* 3.63* 3.51* 3.58* 3.68*  CALCIUM 9.1 8.7 8.9 8.9 8.8 8.5 8.5  AST 30 21  --   --   --   --   --   ALT 11 10  --   --   --   --   --   ALKPHOS 64 60  --   --   --   --   --   BILITOT 0.4 0.4  --   --   --   --   --    ------------------------------------------------------------------------------------------------------------------ estimated creatinine clearance is 31.1 ml/min (by C-G formula based on Cr of  3.68). ------------------------------------------------------------------------------------------------------------------ No results found for this basename: HGBA1C,  in the last 72 hours ------------------------------------------------------------------------------------------------------------------ No results found for this basename: CHOL, HDL, LDLCALC, TRIG, CHOLHDL, LDLDIRECT,  in the last 72 hours ------------------------------------------------------------------------------------------------------------------ No results found for this basename: TSH, T4TOTAL, FREET3, T3FREE, THYROIDAB,  in the last 72 hours ------------------------------------------------------------------------------------------------------------------ No results found for this basename: VITAMINB12, FOLATE, FERRITIN, TIBC, IRON, RETICCTPCT,  in the last 72 hours  Coagulation profile No results found for this basename: INR, PROTIME,  in the last 168 hours  No results found for this basename: DDIMER,  in the last 72 hours  Cardiac Enzymes No results found for this basename: CK, CKMB, TROPONINI, MYOGLOBIN,  in the last 168 hours ------------------------------------------------------------------------------------------------------------------ No components found with this basename: POCBNP,

## 2012-08-26 NOTE — H&P (Signed)
Agree with PA note.  Signed,  Heath K. McCullough, MD [facility]  

## 2012-08-27 ENCOUNTER — Encounter (HOSPITAL_COMMUNITY): Payer: Self-pay | Admitting: Orthopedic Surgery

## 2012-08-27 ENCOUNTER — Encounter: Payer: Self-pay | Admitting: Surgery

## 2012-08-27 DIAGNOSIS — M869 Osteomyelitis, unspecified: Secondary | ICD-10-CM

## 2012-08-27 DIAGNOSIS — Z5189 Encounter for other specified aftercare: Secondary | ICD-10-CM

## 2012-08-27 LAB — CULTURE, ROUTINE-ABSCESS: Culture: NO GROWTH

## 2012-08-27 LAB — BASIC METABOLIC PANEL
BUN: 40 mg/dL — ABNORMAL HIGH (ref 6–23)
CO2: 28 mEq/L (ref 19–32)
Calcium: 8.6 mg/dL (ref 8.4–10.5)
Creatinine, Ser: 3.8 mg/dL — ABNORMAL HIGH (ref 0.50–1.35)
GFR calc non Af Amer: 16 mL/min — ABNORMAL LOW (ref >90)
Glucose, Bld: 84 mg/dL (ref 70–99)

## 2012-08-27 LAB — TISSUE CULTURE: Gram Stain: NONE SEEN

## 2012-08-27 MED ORDER — SODIUM CHLORIDE 0.9 % IV SOLN
1.0000 g | Freq: Two times a day (BID) | INTRAVENOUS | Status: DC
  Administered 2012-08-27 – 2012-08-30 (×7): 1 g via INTRAVENOUS
  Filled 2012-08-27 (×8): qty 1

## 2012-08-27 MED ORDER — FUROSEMIDE 80 MG PO TABS: 100.0000 mg | ORAL_TABLET | Freq: Two times a day (BID) | ORAL | Status: DC

## 2012-08-27 MED ORDER — SODIUM CHLORIDE 0.9 % IV SOLN
INTRAVENOUS | Status: AC
  Administered 2012-08-27 – 2012-08-28 (×3): via INTRAVENOUS

## 2012-08-27 NOTE — Progress Notes (Signed)
ANTIBIOTIC CONSULT NOTE - FOLLOW UP  Pharmacy Consult for Meropenem and Vancomycin Indication: L knee abscess  No Known Allergies  Patient Measurements: Height: 6' 0.83" (185 cm) Weight: 271 lb 6.2 oz (123.1 kg) IBW/kg (Calculated) : 79.52 Adjusted Body Weight:    Vital Signs: Temp: 98.4 F (36.9 C) (08/01 0550) Temp src: Oral (08/01 0550) BP: 168/81 mmHg (08/01 0550) Pulse Rate: 75 (08/01 0550) Intake/Output from previous day: 07/31 0701 - 08/01 0700 In: 4082.9 [P.O.:480; I.V.:2202.9; IV Piggyback:1400] Out: 1100 [Urine:1075; Drains:25] Intake/Output from this shift: Total I/O In: 1516.3 [P.O.:350; I.V.:1166.3] Out: 351 [Urine:350; Stool:1]  Labs:  Recent Labs  08/25/12 0505 08/26/12 0505 08/27/12 0530  WBC  --  9.5  --   HGB  --  10.4*  --   PLT  --  128*  --   CREATININE 3.58* 3.68* 3.80*   Estimated Creatinine Clearance: 29.8 ml/min (by C-G formula based on Cr of 3.8).  Recent Labs  08/25/12 1653  VANCOTROUGH 25.8*     Microbiology: Recent Results (from the past 720 hour(s))  CULTURE, BLOOD (ROUTINE X 2)     Status: None   Collection Time    08/19/12  5:15 PM      Result Value Range Status   Specimen Description BLOOD ARM RIGHT   Final   Special Requests BOTTLES DRAWN AEROBIC ONLY [facility]   Final   Culture  Setup Time 08/19/2012 22:47   Final   Culture NO GROWTH 5 DAYS   Final   Report Status 08/25/2012 FINAL   Final  CULTURE, BLOOD (SINGLE)     Status: None   Collection Time    08/19/12  7:35 PM      Result Value Range Status   Specimen Description BLOOD ARM LEFT   Final   Special Requests BOTTLES DRAWN AEROBIC AND ANAEROBIC 10CC   Final   Culture  Setup Time 08/20/2012 03:45   Final   Culture NO GROWTH 5 DAYS   Final   Report Status 08/26/2012 FINAL   Final  WOUND CULTURE     Status: None   Collection Time    08/20/12 10:41 AM      Result Value Range Status   Specimen Description WOUND LEG LEFT   Final   Special Requests Immunocompromised    Final   Gram Stain     Final   Value: NO WBC SEEN     NO SQUAMOUS EPITHELIAL CELLS SEEN     RARE GRAM NEGATIVE RODS   Culture     Final   Value: ABUNDANT PROTEUS MIRABILIS     MODERATE ENTEROCOCCUS SPECIES   Report Status 08/24/2012 FINAL   Final   Organism ID, Bacteria PROTEUS MIRABILIS   Final   Organism ID, Bacteria ENTEROCOCCUS SPECIES   Final  SURGICAL PCR SCREEN     Status: None   Collection Time    08/23/12 11:01 PM      Result Value Range Status   MRSA, PCR NEGATIVE  NEGATIVE Final   Staphylococcus aureus NEGATIVE  NEGATIVE Final   Comment:            The Xpert SA Assay (FDA     approved for NASAL specimens     in patients over 34 years of age),     is one component of     a comprehensive surveillance     program.  Test performance has     been validated by [employer] for  patients greater     than or equal to 60 year old.     It is not intended     to diagnose infection nor to     guide or monitor treatment.  ANAEROBIC CULTURE     Status: None   Collection Time    08/24/12  4:08 PM      Result Value Range Status   Specimen Description ABSCESS LEFT KNEE   Final   Special Requests PATIENT ON FOLLOWING VANCOMYCIN ZOSYN   Final   Gram Stain     Final   Value: NO WBC SEEN     NO SQUAMOUS EPITHELIAL CELLS SEEN     NO ORGANISMS SEEN   Culture     Final   Value: NO ANAEROBES ISOLATED; CULTURE IN PROGRESS FOR 5 DAYS   Report Status PENDING   Incomplete  CULTURE, ROUTINE-ABSCESS     Status: None   Collection Time    08/24/12  4:08 PM      Result Value Range Status   Specimen Description ABSCESS LEFT KNEE   Final   Special Requests PATIENT ON FOLLOWING VANCOMYCIN ZOSYN   Final   Gram Stain     Final   Value: NO WBC SEEN     NO SQUAMOUS EPITHELIAL CELLS SEEN     NO ORGANISMS SEEN   Culture NO GROWTH 3 DAYS   Final   Report Status 08/27/2012 FINAL   Final  TISSUE CULTURE     Status: None   Collection Time    08/24/12  4:08 PM      Result Value Range Status    Specimen Description TISSUE LEFT KNEE   Final   Special Requests PATIENT ON FOLLOWING VANCOMYCIN ZOSYN   Final   Gram Stain     Final   Value: NO WBC SEEN     NO SQUAMOUS EPITHELIAL CELLS SEEN     NO ORGANISMS SEEN   Culture NO GROWTH 3 DAYS   Final   Report Status 08/27/2012 FINAL   Final  CLOSTRIDIUM DIFFICILE BY PCR     Status: None   Collection Time    08/26/12  9:53 AM      Result Value Range Status   C difficile by pcr NEGATIVE  NEGATIVE Final    Anti-infectives   Start     Dose/Rate Route Frequency Ordered Stop   08/26/12 1800  vancomycin (VANCOCIN) 1,500 mg in sodium chloride 0.9 % 500 mL IVPB     1,500 mg 250 mL/hr over 120 Minutes Intravenous every 72 hours 08/25/12 1900     08/21/12 1800  vancomycin (VANCOCIN) 1,500 mg in sodium chloride 0.9 % 500 mL IVPB  Status:  Discontinued     1,500 mg 250 mL/hr over 120 Minutes Intravenous Every 48 hours 08/19/12 1933 08/25/12 1753   08/20/12 0600  piperacillin-tazobactam (ZOSYN) IVPB 3.375 g  Status:  Discontinued     3.375 g 12.5 mL/hr over 240 Minutes Intravenous 3 times per day 08/20/12 0106 08/27/12 1026   08/20/12 0500  ciprofloxacin (CIPRO) IVPB 400 mg  Status:  Discontinued     400 mg 200 mL/hr over 60 Minutes Intravenous Every 24 hours 08/19/12 2350 08/20/12 0105   08/19/12 1700  piperacillin-tazobactam (ZOSYN) IVPB 3.375 g  Status:  Discontinued     3.375 g 12.5 mL/hr over 240 Minutes Intravenous  Once 08/19/12 1646 08/19/12 1647   08/19/12 1700  piperacillin-tazobactam (ZOSYN) IVPB 3.375 g     3.375 g 100  mL/hr over 30 Minutes Intravenous  Once 08/19/12 1648 08/19/12 2024   08/19/12 1700  vancomycin (VANCOCIN) 2,500 mg in sodium chloride 0.9 % 500 mL IVPB     2,500 mg 250 mL/hr over 120 Minutes Intravenous To Emergency Dept 08/19/12 1650 08/19/12 1949      Assessment:  57 y/o M with LLE cellulitis, hx recent ORIF 6/14, no osteo, 7/29 to OR for left leg I&D and wound vac placement.  Patient was put on vanc/zosyn,  but based on sensitivity results zosyn was stopped.  Based on the concern with possible MRSA or coag neg staph, Vanc was continued.  Based on the possible deep infection that extend to hardware, meropenem was started at a higher dose (crcl 29, wt 123 kg).    Meropenem 8/1>> Vanc 7/24 >> Vanco trough 7/30= 25.8 Zosyn 7/24 >> 8/1  7/25 wound cx - abundant proteus mirabilis sens to all, and moderate enteroccus - sens to amp and vanc 7/24 blood cx (single) - Negative 7/29 Tissue left knee cx: NG x 3 d 7/29 Abscess left knee>> NG x 3 d   Goal of Therapy:  Vancomycin trough level 10-15 mcg/ml  Plan:  1. Start Meropenem 1 gm IV q12hrs 2. Continue Vancomycin to 1500mg  IV q72hrs 3. Monitor crcl, scr  Anabel Bene, PharmD Clinical Pharmacist Pager: [phone]

## 2012-08-27 NOTE — Progress Notes (Signed)
Subjective: 3 Days Post-Op Procedure(s) (LRB): LEFT LEG IRRIGATION AND DEBRIDEMENT WITH POSSIBLE WOUND VAC APPLICATION (Left) Patient reports pain as mild.  Mom and Dad visiting.  No c/o.  Objective: Vital signs in last 24 hours: Temp:  [98.4 F (36.9 C)-98.9 F (37.2 C)] 98.4 F (36.9 C) (08/01 0550) Pulse Rate:  [75-87] 75 (08/01 0550) Resp:  [20-24] 20 (08/01 0550) BP: (136-174)/(74-97) 168/81 mmHg (08/01 0550) SpO2:  [90 %-97 %] 90 % (08/01 0550)  Intake/Output from previous day: 07/31 0701 - 08/01 0700 In: 4082.9 [P.O.:480; I.V.:2202.9; IV Piggyback:1400] Out: 1100 [Urine:1075; Drains:25] Intake/Output this shift: Total I/O In: 1516.3 [P.O.:350; I.V.:1166.3] Out: 351 [Urine:350; Stool:1]   Recent Labs  08/26/12 0505  HGB 10.4*    Recent Labs  08/26/12 0505  WBC 9.5  RBC 3.89*  HCT 35.6*  PLT 128*    Recent Labs  08/26/12 0505 08/27/12 0530  NA 141 143  K 3.4* 3.6  CL 102 104  CO2 29 28  BUN 43* 40*  CREATININE 3.68* 3.80*  GLUCOSE 96 84  CALCIUM 8.5 8.6   No results found for this basename: LABPT, INR,  in the last 72 hours  PE:  left lower extremity with dry dressing in place.  SS drainage decreasing.  Assessment/Plan: 3 Days Post-Op Procedure(s) (LRB): LEFT LEG IRRIGATION AND DEBRIDEMENT WITH POSSIBLE WOUND VAC APPLICATION (Left) Continue ABX therapy due to Post-op infection We'll plan d/c for when the wound vac is ready and HH abx are set up.  Toni Arthurs 08/27/2012, 1:28 PM

## 2012-08-27 NOTE — Progress Notes (Signed)
Occupational Therapy Evaluation Patient Details Name: Chad [name] MRN: [id number] DOB: 02/08/1955 Today's Date: 08/27/2012 Time: 1202-1233 OT Time Calculation (min): 31 min  OT Assessment / Plan / Recommendation History of present illness 57 y/o male now almost 6 weeks post op from ORIF of left supracondylar femur fracture. He presented to clinic yesterday with swelling of the left lower extremity. Negative for DVT; +cellulitis. He has been at home now for two weeks after spending several weeks at [facility]. He has been NWB on the L LE. s/p I&D 08/24/12 and continues to be NWB.    Clinical Impression   PTA, pt living at home and had assistance from family as needed. Pt has all DME and AE for ADL and mobility. Will plan to see in am to address toilet transfers/hygiene and review exercise program. Pt will be ready for D/C when medically stable.     OT Assessment  Patient needs continued OT Services    Follow Up Recommendations  No OT follow up    Barriers to Discharge      Equipment Recommendations  None recommended by OT    Recommendations for Other Services    Frequency  Min 2X/week    Precautions / Restrictions Precautions Precautions: Fall Required Braces or Orthoses: Knee Immobilizer - Left Knee Immobilizer - Left: On at all times Other Brace/Splint: Bledsoe Brace LLE Restrictions Weight Bearing Restrictions: Yes LLE Weight Bearing: Touchdown weight bearing Other Position/Activity Restrictions: ROM to lt knee as tolerated   Pertinent Vitals/Pain no apparent distress     ADL  Grooming: Set up Where Assessed - Grooming: Unsupported sitting Upper Body Bathing: Set up Where Assessed - Upper Body Bathing: Unsupported sitting Lower Body Bathing: Set up Where Assessed - Lower Body Bathing: Rolling right and/or left;Supine, head of bed up Upper Body Dressing: Set up Where Assessed - Upper Body Dressing: Supine, head of bed up Lower Body Dressing: Set up Where Assessed - Lower  Body Dressing: Rolling right and/or left Toilet Transfer: Minimal assistance Toilet Transfer Method: Other (comment) (ambulating) Toilet Transfer Equipment: Other (comment) (bed - chair) Toileting - Clothing Manipulation and Hygiene: Minimal assistance Where Assessed - Engineer, mining and Hygiene: Lean right and/or left Equipment Used: Gait belt;Rolling walker Transfers/Ambulation Related to ADLs: minguard ADL Comments: has DME and AE ferom previous admission    OT Diagnosis: Generalized weakness;Acute pain  OT Problem List: Decreased strength;Decreased activity tolerance;Decreased range of motion;Obesity;Pain;Increased edema OT Treatment Interventions: Self-care/ADL training;Therapeutic exercise;Therapeutic activities;Patient/family education;DME and/or AE instruction   OT Goals(Current goals can be found in the care plan section) Acute Rehab OT Goals Patient Stated Goal: to get back to work OT Goal Formulation: With patient Time For Goal Achievement: 09/10/12 Potential to Achieve Goals: Good  Visit Information  Last OT Received On: 08/27/12 Assistance Needed: +1 History of Present Illness: 57 y/o male now almost 6 weeks post op from ORIF of left supracondylar femur fracture. He presented to clinic yesterday with swelling of the left lower extremity. Negative for DVT; +cellulitis. He has been at home now for two weeks after spending several weeks at [facility]. He has been NWB on the L LE. s/p I&D 08/24/12 and continues to be NWB.        Prior Functioning     Home Living Family/patient expects to be discharged to:: Private residence Living Arrangements: Other relatives Available Help at Discharge: Family;Friend(s);Available 24 hours/day Type of Home: House Home Access: Ramped entrance Entrance Stairs-Number of Steps: 4 steps (other entry 8-9) Entrance  Stairs-Rails: Left;Right Home Layout: Two level;Able to live on main level with bedroom/bathroom Home Equipment:  Walker - 4 wheels;Bedside commode;Wheelchair - Careers adviser (comment) Additional Comments: has ramp in a building that he can have placed at back 4 steps off deck  Lives With: Alone Prior Function Level of Independence: Independent (prior to injury) Comments: was squat-pivoting to w/c or bsc independently (no device); standing with RW for pericare with assist Communication Communication: No difficulties Dominant Hand: Right         Vision/Perception Vision - History Patient Visual Report: No change from baseline Vision - Assessment Eye Alignment: Within Functional Limits Vision Assessment: Vision not tested Perception Perception: Within Functional Limits Praxis Praxis: Intact   Cognition  Cognition Arousal/Alertness: Awake/alert Behavior During Therapy: WFL for tasks assessed/performed Overall Cognitive Status: Within Functional Limits for tasks assessed    Extremity/Trunk Assessment Lower Extremity Assessment Lower Extremity Assessment: LLE deficits/detail LLE Deficits / Details: grossly edematous with erythema of lower leg; bledsoe brace was already unhinged on arrival; AAROM to 60 knee flexion; knee extension strength 3-/5 Cervical / Trunk Assessment Cervical / Trunk Assessment: Normal     Mobility Bed Mobility Bed Mobility: Supine to Sit;Sitting - Scoot to Edge of Bed Supine to Sit: 6: Modified independent (Device/Increase time) Sitting - Scoot to Edge of Bed: 6: Modified independent (Device/Increase time) Sit to Supine: 6: Modified independent (Device/Increase time) Transfers Transfers: Sit to Stand;Stand to Sit Sit to Stand: 4: Min guard;From bed;With upper extremity assist Stand to Sit: 4: Min guard;To chair/3-in-1;With armrests;With upper extremity assist Details for Transfer Assistance: Minguard for safety with cues for hand placement     Exercise General Exercises - Lower Extremity Heel Slides:  (prolonged stretch (up to 1 minute) with ea rep) Other  Exercises Other Exercises: w/c push ups Other Exercises: encourgared theraband ex for BUE strengtheningq   Balance Balance Balance Assessed: Yes Dynamic Sitting Balance Dynamic Sitting - Balance Support: Bilateral upper extremity supported Dynamic Sitting - Level of Assistance: 6: Modified independent (Device/Increase time) Static Standing Balance Static Standing - Balance Support: Bilateral upper extremity supported;During functional activity Static Standing - Level of Assistance: 5: Stand by assistance Dynamic Standing Balance Dynamic Standing - Balance Support: Bilateral upper extremity supported Dynamic Standing - Level of Assistance: 5: Stand by assistance   End of Session OT - End of Session Equipment Utilized During Treatment: Gait belt;Rolling walker;Left knee immobilizer;Other (comment) (Bledsoe brace) Activity Tolerance: Patient tolerated treatment well Patient left: in chair;with call bell/phone within reach Nurse Communication: Mobility status  GO     Bemnet Trovato,HILLARY 08/27/2012, 1:39 PM [facility], OTR/L  [phone] 08/27/2012

## 2012-08-27 NOTE — Progress Notes (Signed)
[facility]  Day 9 vancomycin  Day 9 piperacillin tazobactam   Subjective: No new complaints   Antibiotics:  Anti-infectives   Start     Dose/Rate Route Frequency Ordered Stop   08/27/12 1200  meropenem (MERREM) 1 g in sodium chloride 0.9 % 100 mL IVPB     1 g 200 mL/hr over 30 Minutes Intravenous Every 12 hours 08/27/12 1133     08/26/12 1800  vancomycin (VANCOCIN) 1,500 mg in sodium chloride 0.9 % 500 mL IVPB     1,500 mg 250 mL/hr over 120 Minutes Intravenous every 72 hours 08/25/12 1900     08/21/12 1800  vancomycin (VANCOCIN) 1,500 mg in sodium chloride 0.9 % 500 mL IVPB  Status:  Discontinued     1,500 mg 250 mL/hr over 120 Minutes Intravenous Every 48 hours 08/19/12 1933 08/25/12 1753   08/20/12 0600  piperacillin-tazobactam (ZOSYN) IVPB 3.375 g  Status:  Discontinued     3.375 g 12.5 mL/hr over 240 Minutes Intravenous 3 times per day 08/20/12 0106 08/27/12 1026   08/20/12 0500  ciprofloxacin (CIPRO) IVPB 400 mg  Status:  Discontinued     400 mg 200 mL/hr over 60 Minutes Intravenous Every 24 hours 08/19/12 2350 08/20/12 0105   08/19/12 1700  piperacillin-tazobactam (ZOSYN) IVPB 3.375 g  Status:  Discontinued     3.375 g 12.5 mL/hr over 240 Minutes Intravenous  Once 08/19/12 1646 08/19/12 1647   08/19/12 1700  piperacillin-tazobactam (ZOSYN) IVPB 3.375 g     3.375 g 100 mL/hr over 30 Minutes Intravenous  Once 08/19/12 1648 08/19/12 2024   08/19/12 1700  vancomycin (VANCOCIN) 2,500 mg in sodium chloride 0.9 % 500 mL IVPB     2,500 mg 250 mL/hr over 120 Minutes Intravenous To Emergency Dept 08/19/12 1650 08/19/12 1949      Medications: Scheduled Meds: . allopurinol  100 mg Oral BID  . amiodarone  200 mg Oral Daily  . aspirin EC  81 mg Oral Daily  . carvedilol  25 mg Oral BID WC  . [START ON 08/28/2012] furosemide  100 mg Oral BID  . heparin subcutaneous  5,000 Units Subcutaneous Q8H  . hydrALAZINE  50 mg Oral Q8H  . isosorbide  mononitrate  60 mg Oral Daily  . meropenem (MERREM) IV  1 g Intravenous Q12H  . pantoprazole  40 mg Oral Daily  . potassium chloride SA  20 mEq Oral Daily  . predniSONE  10 mg Oral Q breakfast  . simvastatin  20 mg Oral q1800  . sirolimus  1.2 mg Oral Daily  . sodium chloride  3 mL Intravenous Q12H  . vancomycin  1,500 mg Intravenous Q72H   Continuous Infusions: . sodium chloride 75 mL/hr at 08/27/12 1125   PRN Meds:.acetaminophen, acetaminophen, HYDROmorphone (DILAUDID) injection, meperidine (DEMEROL) injection, methocarbamol, ondansetron (ZOFRAN) IV, ondansetron, oxyCODONE, promethazine   Objective: Weight change:   Intake/Output Summary (Last 24 hours) at 08/27/12 1145 Last data filed at 08/27/12 0900  Gross per 24 hour  Intake 3467.5 ml  Output   1026 ml  Net 2441.5 ml   Blood pressure 168/81, pulse 75, temperature 98.4 F (36.9 C), temperature source Oral, resp. rate 20, height 6' 0.84" (1.85 m), weight 271 lb 6.2 oz (123.1 kg), SpO2 90.00%. Temp:  [98.4 F (36.9 C)-98.9 F (37.2 C)] 98.4 F (36.9 C) (08/01 0550) Pulse Rate:  [75-87] 75 (08/01 0550) Resp:  [20-24] 20 (08/01 0550) BP: (136-174)/(74-97) 168/81 mmHg (08/01 0550) SpO2:  [  90 %-97 %] 90 % (08/01 0550)  Physical Exam: General: Alert and awake, oriented x3, not in any acute distress. HEENT: EOMI CVS regular rate, normal r,  no murmur rubs or gallops Chest: clear to auscultation bilaterally, no wheezing, rales or rhonchi Abdomen: Healed incision right lower quadrant.  Left leg and bulky Ace wrap    Lab Results:  Recent Labs  08/26/12 0505  WBC 9.5  HGB 10.4*  HCT 35.6*  PLT 128*    BMET  Recent Labs  08/26/12 0505 08/27/12 0530  NA 141 143  K 3.4* 3.6  CL 102 104  CO2 29 28  GLUCOSE 96 84  BUN 43* 40*  CREATININE 3.68* 3.80*  CALCIUM 8.5 8.6    Micro Results: Recent Results (from the past 240 hour(s))  CULTURE, BLOOD (ROUTINE X 2)     Status: None   Collection Time    08/19/12   5:15 PM      Result Value Range Status   Specimen Description BLOOD ARM RIGHT   Final   Special Requests BOTTLES DRAWN AEROBIC ONLY [facility]   Final   Culture  Setup Time 08/19/2012 22:47   Final   Culture NO GROWTH 5 DAYS   Final   Report Status 08/25/2012 FINAL   Final  CULTURE, BLOOD (SINGLE)     Status: None   Collection Time    08/19/12  7:35 PM      Result Value Range Status   Specimen Description BLOOD ARM LEFT   Final   Special Requests BOTTLES DRAWN AEROBIC AND ANAEROBIC 10CC   Final   Culture  Setup Time 08/20/2012 03:45   Final   Culture NO GROWTH 5 DAYS   Final   Report Status 08/26/2012 FINAL   Final  WOUND CULTURE     Status: None   Collection Time    08/20/12 10:41 AM      Result Value Range Status   Specimen Description WOUND LEG LEFT   Final   Special Requests Immunocompromised   Final   Gram Stain     Final   Value: NO WBC SEEN     NO SQUAMOUS EPITHELIAL CELLS SEEN     RARE GRAM NEGATIVE RODS   Culture     Final   Value: ABUNDANT PROTEUS MIRABILIS     MODERATE ENTEROCOCCUS SPECIES   Report Status 08/24/2012 FINAL   Final   Organism ID, Bacteria PROTEUS MIRABILIS   Final   Organism ID, Bacteria ENTEROCOCCUS SPECIES   Final  SURGICAL PCR SCREEN     Status: None   Collection Time    08/23/12 11:01 PM      Result Value Range Status   MRSA, PCR NEGATIVE  NEGATIVE Final   Staphylococcus aureus NEGATIVE  NEGATIVE Final   Comment:            The Xpert SA Assay (FDA     approved for NASAL specimens     in patients over 44 years of age),     is one component of     a comprehensive surveillance     program.  Test performance has     been validated by [employer] for patients greater     than or equal to 21 year old.     It is not intended     to diagnose infection nor to     guide or monitor treatment.  ANAEROBIC CULTURE     Status: None  Collection Time    08/24/12  4:08 PM      Result Value Range Status   Specimen Description ABSCESS LEFT KNEE   Final     Special Requests PATIENT ON FOLLOWING VANCOMYCIN ZOSYN   Final   Gram Stain     Final   Value: NO WBC SEEN     NO SQUAMOUS EPITHELIAL CELLS SEEN     NO ORGANISMS SEEN   Culture     Final   Value: NO ANAEROBES ISOLATED; CULTURE IN PROGRESS FOR 5 DAYS   Report Status PENDING   Incomplete  CULTURE, ROUTINE-ABSCESS     Status: None   Collection Time    08/24/12  4:08 PM      Result Value Range Status   Specimen Description ABSCESS LEFT KNEE   Final   Special Requests PATIENT ON FOLLOWING VANCOMYCIN ZOSYN   Final   Gram Stain     Final   Value: NO WBC SEEN     NO SQUAMOUS EPITHELIAL CELLS SEEN     NO ORGANISMS SEEN   Culture NO GROWTH 3 DAYS   Final   Report Status 08/27/2012 FINAL   Final  TISSUE CULTURE     Status: None   Collection Time    08/24/12  4:08 PM      Result Value Range Status   Specimen Description TISSUE LEFT KNEE   Final   Special Requests PATIENT ON FOLLOWING VANCOMYCIN ZOSYN   Final   Gram Stain     Final   Value: NO WBC SEEN     NO SQUAMOUS EPITHELIAL CELLS SEEN     NO ORGANISMS SEEN   Culture NO GROWTH 3 DAYS   Final   Report Status 08/27/2012 FINAL   Final  CLOSTRIDIUM DIFFICILE BY PCR     Status: None   Collection Time    08/26/12  9:53 AM      Result Value Range Status   C difficile by pcr NEGATIVE  NEGATIVE Final    Studies/Results: Ir Fluoro Guide Cv Line Right  08/26/2012   *RADIOLOGY REPORT*  PICC PLACEMENT WITH ULTRASOUND AND FLUOROSCOPIC  GUIDANCE  Clinical History: 57 year old male with postsurgical infection following ORIF of a complex femoral fracture.  Durable central venous access is required for outpatient IV antibiotic therapy. The patient has a history of chronic kidney disease.  Therefore, a tunneled IJ PICC will be placed to preserve the upper extremity veins.  Fluoroscopy Time: 1 minute 42 seconds  Procedure:  The right neck was prepped with chlorhexidine, draped in the usual sterile fashion using maximum barrier technique (cap and mask,  sterile gown, sterile gloves, large sterile sheet, hand hygiene and cutaneous antiseptic).  Local anesthesia was attained by infiltration with 1% lidocaine.  Ultrasound demonstrated patency of the right internal jugular vein, and this was documented with an image.  Under real-time ultrasound guidance, this vein was accessed with a 21 gauge micropuncture needle and image documentation was performed.  The needle was exchanged over a guidewire for a peel-away sheath. A small dermatotomy was made at the venous access site.  A suitable skin entry site inferior to the clavicle was selected. Local anesthesia was attained by infiltration with 1% lidocaine.  A small dermatotomy was made with 11 blade.  The cuffed PICC was then tunneled from the skin exit site to the venous access site.  The 21 cm 5 [place] dual lumen power injectable PICC was advanced, and positioned with its tip at the lower  SVC/right atrial junction. Fluoroscopy during the procedure and fluoro spot radiograph confirms appropriate catheter position.  The catheter was flushed, secured to the skin with Prolene sutures, and covered with a sterile dressing.  Complications:  None.  The patient tolerated the procedure well.  IMPRESSION:  Successful placement of a tunneled right IJ approach dual lumen power PICC with sonographic and fluoroscopic guidance.  The catheter is ready for use.  Signed,  Sterling Big, MD Vascular & Interventional Radiologist [facility]   Original Report Authenticated By: Malachy Moan, M.D.   Ir US Guide Vasc Access Right  08/26/2012   *RADIOLOGY REPORT*  PICC PLACEMENT WITH ULTRASOUND AND FLUOROSCOPIC  GUIDANCE  Clinical History: 57 year old male with postsurgical infection following ORIF of a complex femoral fracture.  Durable central venous access is required for outpatient IV antibiotic therapy. The patient has a history of chronic kidney disease.  Therefore, a tunneled IJ PICC will be placed to preserve the  upper extremity veins.  Fluoroscopy Time: 1 minute 42 seconds  Procedure:  The right neck was prepped with chlorhexidine, draped in the usual sterile fashion using maximum barrier technique (cap and mask, sterile gown, sterile gloves, large sterile sheet, hand hygiene and cutaneous antiseptic).  Local anesthesia was attained by infiltration with 1% lidocaine.  Ultrasound demonstrated patency of the right internal jugular vein, and this was documented with an image.  Under real-time ultrasound guidance, this vein was accessed with a 21 gauge micropuncture needle and image documentation was performed.  The needle was exchanged over a guidewire for a peel-away sheath. A small dermatotomy was made at the venous access site.  A suitable skin entry site inferior to the clavicle was selected. Local anesthesia was attained by infiltration with 1% lidocaine.  A small dermatotomy was made with 11 blade.  The cuffed PICC was then tunneled from the skin exit site to the venous access site.  The 21 cm 5 [place] dual lumen power injectable PICC was advanced, and positioned with its tip at the lower SVC/right atrial junction. Fluoroscopy during the procedure and fluoro spot radiograph confirms appropriate catheter position.  The catheter was flushed, secured to the skin with Prolene sutures, and covered with a sterile dressing.  Complications:  None.  The patient tolerated the procedure well.  IMPRESSION:  Successful placement of a tunneled right IJ approach dual lumen power PICC with sonographic and fluoroscopic guidance.  The catheter is ready for use.  Signed,  Sterling Big, MD Vascular & Interventional Radiologist [facility]   Original Report Authenticated By: Malachy Moan, M.D.      Assessment/Plan: Chad [name] is a 57 y.o. male with  Left THA fell and distal femur fracture, sp ORIF now anteromedial abscess sp I and D by Dr. [name] and I and D of "seroma" that extneded to hardware and concerning  for deep infection . Superficial cultures grew AMp S Enterococcus and Proteus   #1 Presumed deep hardware infection and postop wound infection after ORIF sp I and D   Certainly his superficial pathogens could be covered with UNASYn alone.  Deep cultures negative, but given that he was on vanco, zosyn, and his immunocompromised state will stay broad  --for ease of dosing will go with MEROPENEM 1 G IV q 12 hours --along with continued vancomycin dosed by pharmacy for trough goal 15-20 --I think he should remain on these for a 6 week postoperative course THRU September 9th, at which point in time IF he is doing well  will transition to oral therapy until hardware can be removed  #2 Hep C: check Hep C genotype  I will arrange HSFU in our ID clinic [facility] [address], [address]  Please call with further questions   LOS: 8 days   Acey Lav 08/27/2012, 11:45 AM

## 2012-08-27 NOTE — Progress Notes (Signed)
Physical Therapy Treatment Patient Details Name: Chad [name] MRN: [id number] DOB: 1955/01/29 Today's Date: 08/27/2012 Time: 1334-1400 PT Time Calculation (min): 26 min  PT Assessment / Plan / Recommendation  History of Present Illness 57 y/o male now almost 6 weeks post op from ORIF of left supracondylar femur fracture. He presented to clinic yesterday with swelling of the left lower extremity. Negative for DVT; +cellulitis. He has been at home now for two weeks after spending several weeks at [facility]. He has been NWB on the L LE. s/p I&D 08/24/12 and continues to be NWB.    PT Comments   Pt making excellent progress despite return to NWB status after I&D. Pt highly motivated.   Follow Up Recommendations  Home health PT;Supervision for mobility/OOB     Does the patient have the potential to tolerate intense rehabilitation     Barriers to Discharge        Equipment Recommendations  Rolling walker with 5" wheels (pt aware would have to pay out-of-pocket likely)--pt has a rollator, but will not be able to ambulate NWB with it. Needs 2 wheeled RW    Recommendations for Other Services    Frequency Min 3X/week   Progress towards PT Goals Progress towards PT goals: Progressing toward goals  Plan Current plan remains appropriate    Precautions / Restrictions Precautions Precautions: Fall Required Braces or Orthoses: Knee Immobilizer - Left Knee Immobilizer - Left: On at all times Other Brace/Splint: Bledsoe Brace LLE Restrictions Weight Bearing Restrictions: Yes LLE Weight Bearing: Non weight bearing Other Position/Activity Restrictions: ROM to lt knee as tolerated   Pertinent Vitals/Pain Lt knee; pt requesting pain meds after ambulation; RN aware; patient repositioned for comfort     Mobility  Bed Mobility Bed Mobility: Not assessed Supine to Sit: 6: Modified independent (Device/Increase time) Sitting - Scoot to Edge of Bed: 6: Modified independent (Device/Increase time) Sit to  Supine: 6: Modified independent (Device/Increase time) Transfers Sit to Stand: 5: Supervision;From chair/3-in-1 Stand to Sit: 5: Supervision;To chair/3-in-1 Details for Transfer Assistance: no cues needed to/from w/c Ambulation/Gait Ambulation/Gait Assistance: 4: Min guard Ambulation Distance (Feet): 15 Feet Assistive device: Rolling walker Ambulation/Gait Assistance Details: excellent sequencing; able to maintain NWB while advancing RLE; occ resting LLE on floor when standing/resting Gait Pattern: Step-to pattern Gait velocity: very decreased Wheelchair Mobility Wheelchair Mobility: Yes Wheelchair Assistance: 6: Modified independent (Device/Increase time) Occupational hygienist: Both upper extremities Wheelchair Parts Management: Independent;Other (comment) Distance: 15    Exercises General Exercises - Lower Extremity Ankle Circles/Pumps: AROM;Left;15 reps;Seated Heel Slides:  (prolonged stretch (up to 1 minute) with ea rep) Other Exercises Other Exercises: w/c push ups Other Exercises: encourgared theraband ex for BUE strengtheningq   PT Diagnosis:    PT Problem List:   PT Treatment Interventions:     PT Goals (current goals can now be found in the care plan section) Acute Rehab PT Goals Patient Stated Goal: to get back to work  Visit Information  Last PT Received On: 08/27/12 Assistance Needed: +1 History of Present Illness: 57 y/o male now almost 6 weeks post op from ORIF of left supracondylar femur fracture. He presented to clinic yesterday with swelling of the left lower extremity. Negative for DVT; +cellulitis. He has been at home now for two weeks after spending several weeks at [facility]. He has been NWB on the L LE. s/p I&D 08/24/12 and continues to be NWB.     Subjective Data  Patient Stated Goal: to get back to work  Cognition  Cognition Arousal/Alertness: Awake/alert Behavior During Therapy: WFL for tasks assessed/performed Overall Cognitive Status: Within  Functional Limits for tasks assessed    Balance  Balance Balance Assessed: Yes Dynamic Sitting Balance Dynamic Sitting - Balance Support: Bilateral upper extremity supported Dynamic Sitting - Level of Assistance: 6: Modified independent (Device/Increase time) Static Standing Balance Static Standing - Balance Support: Bilateral upper extremity supported;During functional activity Static Standing - Level of Assistance: 5: Stand by assistance Dynamic Standing Balance Dynamic Standing - Balance Support: Bilateral upper extremity supported Dynamic Standing - Level of Assistance: 5: Stand by assistance  End of Session PT - End of Session Equipment Utilized During Treatment: Gait belt;Left knee immobilizer Activity Tolerance: Patient tolerated treatment well Patient left: in chair;with call bell/phone within reach   GP     Kaja Jackowski 08/27/2012, 2:07 PM

## 2012-08-27 NOTE — Progress Notes (Signed)
[name]                                                                                Patient Demographics  Chad [name], is a 57 y.o. male, DOB - [birth year], ZOX:[id number], WJX:[id number]  Admit date - 08/19/2012  Admitting Physician Kathlen Mody, MD  Outpatient Primary MD for the patient is [name], MD  LOS - 8   Patient's care transferred to Dr. Toni Arthurs as attending on 08/26/2012 after discussions with him, we will continue to follow the patient daily for his medical problems.   Chief Complaint  Patient presents with  . Leg Swelling        Assessment & Plan    1. Postoperative wound infection of the left leg after initial ORIF of the left femur 4 weeks ago. He is currently on broad-spectrum antibiotics, status post incision and drainage by Dr. [name] on 08/24/2012, ID has been consulted for recommendations on long-term antibiotics. We'll resume PT, IR consulted for IJ PICC line to be placed, will also order home health versus nursing home placement evaluation, in my opinion due to complex wound care, wound VAC, IV antibiotics patient should have gone to a monitored setting like SNIF vs LTAC, however patient himself chooses to go home with home health. Also Dr. [name] okay for home discharge with home health followup.     2. 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 Last EF 35% on echogram done in December 2013 long with grade 2 diastolic dysfunction- he remains compensated from the standpoint, he is on hydralazine, Coreg, Lasix at home dose. Along with aspirin and amiodarone. Compensated, lasix held 08-27-12 again.     3. History of paroxysmal atrial fibrillation patient on amiodarone along with Coreg which will be continued. Also on home dose 81 mg aspirin.    4. Dyslipidemia on statin which is being continued.     5. History of renal transplant and chronic kidney disease stage V. Creatinine at baseline  (4.4 to 4.6) , continue his home immunosuppressants unchanged. Creatinine slightly higher on it 02/16/2012, discussed in detail with nephrologist Dr. Eliott Nine, she recommended holding Lasix with IVF, repeat BMP in am, monitor Vanco levels closely (pharm doing) .      6.H/O Hep C - stable.     7.HTN- Continue present regimen of beta blocker and hydralazine, since creatinine is rising we'll avoid rapid drop in blood pressure, we'll not add any further medications today.     Code Status: full  Family Communication: none present  Disposition Plan: TBD   Procedures x-ray left to be a fibula, chest x-ray, I&D of the left leg by Dr. [name]   Consults  orthopedics and ID   DVT Prophylaxis  Heparin    Lab Results  Component Value Date   PLT 128* 08/26/2012    Medications  Scheduled Meds: . allopurinol  100 mg Oral BID  . amiodarone  200 mg Oral Daily  . aspirin EC  81 mg Oral Daily  . carvedilol  25 mg Oral BID WC  . heparin subcutaneous  5,000 Units Subcutaneous Q8H  . hydrALAZINE  50 mg Oral Q8H  .  isosorbide mononitrate  60 mg Oral Daily  . meropenem (MERREM) IV  1 g Intravenous Q12H  . pantoprazole  40 mg Oral Daily  . potassium chloride SA  20 mEq Oral Daily  . predniSONE  10 mg Oral Q breakfast  . simvastatin  20 mg Oral q1800  . sirolimus  1.2 mg Oral Daily  . sodium chloride  3 mL Intravenous Q12H  . vancomycin  1,500 mg Intravenous Q72H   Continuous Infusions: . sodium chloride 75 mL/hr at 08/27/12 1125   PRN Meds:.acetaminophen, acetaminophen, HYDROmorphone (DILAUDID) injection, meperidine (DEMEROL) injection, methocarbamol, ondansetron (ZOFRAN) IV, ondansetron, oxyCODONE, promethazine  Antibiotics    Anti-infectives   Start     Dose/Rate Route Frequency Ordered Stop   08/27/12 1200  meropenem (MERREM) 1 g in sodium chloride 0.9 % 100 mL IVPB     1 g 200 mL/hr over 30 Minutes Intravenous Every 12 hours 08/27/12 1133     08/26/12 1800  vancomycin  (VANCOCIN) 1,500 mg in sodium chloride 0.9 % 500 mL IVPB     1,500 mg 250 mL/hr over 120 Minutes Intravenous every 72 hours 08/25/12 1900     08/21/12 1800  vancomycin (VANCOCIN) 1,500 mg in sodium chloride 0.9 % 500 mL IVPB  Status:  Discontinued     1,500 mg 250 mL/hr over 120 Minutes Intravenous Every 48 hours 08/19/12 1933 08/25/12 1753   08/20/12 0600  piperacillin-tazobactam (ZOSYN) IVPB 3.375 g  Status:  Discontinued     3.375 g 12.5 mL/hr over 240 Minutes Intravenous 3 times per day 08/20/12 0106 08/27/12 1026   08/20/12 0500  ciprofloxacin (CIPRO) IVPB 400 mg  Status:  Discontinued     400 mg 200 mL/hr over 60 Minutes Intravenous Every 24 hours 08/19/12 2350 08/20/12 0105   08/19/12 1700  piperacillin-tazobactam (ZOSYN) IVPB 3.375 g  Status:  Discontinued     3.375 g 12.5 mL/hr over 240 Minutes Intravenous  Once 08/19/12 1646 08/19/12 1647   08/19/12 1700  piperacillin-tazobactam (ZOSYN) IVPB 3.375 g     3.375 g 100 mL/hr over 30 Minutes Intravenous  Once 08/19/12 1648 08/19/12 2024   08/19/12 1700  vancomycin (VANCOCIN) 2,500 mg in sodium chloride 0.9 % 500 mL IVPB     2,500 mg 250 mL/hr over 120 Minutes Intravenous To Emergency Dept 08/19/12 1650 08/19/12 1949       Time Spent in minutes   35   SINGH,PRASHANT K M.D on 08/27/2012 at 12:14 PM  Between 7am to 7pm - Pager - [phone]  After 7pm go to [email or web address] - password [email or web address]  And look for the night coverage person covering for me after hours  [facility] Office  [phone]    Subjective:   Chad [name] today has, No headache, No chest pain, No abdominal pain - No Nausea, No new weakness tingling or numbness, No Cough - SOB.    Objective:   Filed Vitals:   08/26/12 1455 08/26/12 1525 08/26/12 2105 08/27/12 0550  BP: 145/87 136/74 136/76 168/81  Pulse: 86 80 76 75  Temp:  98.9 F (37.2 C) 98.7 F (37.1 C) 98.4 F (36.9 C)  TempSrc:   Oral Oral  Resp: 23 20 22 20   Height:      Weight:       SpO2: 95% 95% 91% 90%    Wt Readings from Last 3 Encounters:  08/25/12 123.1 kg (271 lb 6.2 oz)  08/25/12 123.1 kg (271 lb 6.2 oz)  08/05/12 129.7 kg (285 lb  15 oz)     Intake/Output Summary (Last 24 hours) at 08/27/12 1214 Last data filed at 08/27/12 0900  Gross per 24 hour  Intake 3467.5 ml  Output   1026 ml  Net 2441.5 ml    Exam Awake Alert, Oriented X 3, No new F.N deficits, Normal affect [place].AT,PERRAL Supple Neck,No JVD, No cervical lymphadenopathy appriciated.  Symmetrical Chest wall movement, Good air movement bilaterally, CTAB RRR,No Gallops,Rubs or new Murmurs, No Parasternal Heave +ve B.Sounds, Abd Soft, Non tender, No organomegaly appriciated, No rebound - guarding or rigidity. No Cyanosis, Clubbing or edema, No new Rash or bruise , left leg in bandage and splint  Data Review   Micro Results Recent Results (from the past 240 hour(s))  CULTURE, BLOOD (ROUTINE X 2)     Status: None   Collection Time    08/19/12  5:15 PM      Result Value Range Status   Specimen Description BLOOD ARM RIGHT   Final   Special Requests BOTTLES DRAWN AEROBIC ONLY [place]   Final   Culture  Setup Time 08/19/2012 22:47   Final   Culture NO GROWTH 5 DAYS   Final   Report Status 08/25/2012 FINAL   Final  CULTURE, BLOOD (SINGLE)     Status: None   Collection Time    08/19/12  7:35 PM      Result Value Range Status   Specimen Description BLOOD ARM LEFT   Final   Special Requests BOTTLES DRAWN AEROBIC AND ANAEROBIC 10CC   Final   Culture  Setup Time 08/20/2012 03:45   Final   Culture NO GROWTH 5 DAYS   Final   Report Status 08/26/2012 FINAL   Final  WOUND CULTURE     Status: None   Collection Time    08/20/12 10:41 AM      Result Value Range Status   Specimen Description WOUND LEG LEFT   Final   Special Requests Immunocompromised   Final   Gram Stain     Final   Value: NO WBC SEEN     NO SQUAMOUS EPITHELIAL CELLS SEEN     RARE GRAM NEGATIVE RODS   Culture     Final   Value:  ABUNDANT PROTEUS MIRABILIS     MODERATE ENTEROCOCCUS SPECIES   Report Status 08/24/2012 FINAL   Final   Organism ID, Bacteria PROTEUS MIRABILIS   Final   Organism ID, Bacteria ENTEROCOCCUS SPECIES   Final  SURGICAL PCR SCREEN     Status: None   Collection Time    08/23/12 11:01 PM      Result Value Range Status   MRSA, PCR NEGATIVE  NEGATIVE Final   Staphylococcus aureus NEGATIVE  NEGATIVE Final   Comment:            The Xpert SA Assay (FDA     approved for NASAL specimens     in patients over 41 years of age),     is one component of     a comprehensive surveillance     program.  Test performance has     been validated by [employer] for patients greater     than or equal to 61 year old.     It is not intended     to diagnose infection nor to     guide or monitor treatment.  ANAEROBIC CULTURE     Status: None   Collection Time    08/24/12  4:08  PM      Result Value Range Status   Specimen Description ABSCESS LEFT KNEE   Final   Special Requests PATIENT ON FOLLOWING VANCOMYCIN ZOSYN   Final   Gram Stain     Final   Value: NO WBC SEEN     NO SQUAMOUS EPITHELIAL CELLS SEEN     NO ORGANISMS SEEN   Culture     Final   Value: NO ANAEROBES ISOLATED; CULTURE IN PROGRESS FOR 5 DAYS   Report Status PENDING   Incomplete  CULTURE, ROUTINE-ABSCESS     Status: None   Collection Time    08/24/12  4:08 PM      Result Value Range Status   Specimen Description ABSCESS LEFT KNEE   Final   Special Requests PATIENT ON FOLLOWING VANCOMYCIN ZOSYN   Final   Gram Stain     Final   Value: NO WBC SEEN     NO SQUAMOUS EPITHELIAL CELLS SEEN     NO ORGANISMS SEEN   Culture NO GROWTH 3 DAYS   Final   Report Status 08/27/2012 FINAL   Final  TISSUE CULTURE     Status: None   Collection Time    08/24/12  4:08 PM      Result Value Range Status   Specimen Description TISSUE LEFT KNEE   Final   Special Requests PATIENT ON FOLLOWING VANCOMYCIN ZOSYN   Final   Gram Stain     Final   Value: NO  WBC SEEN     NO SQUAMOUS EPITHELIAL CELLS SEEN     NO ORGANISMS SEEN   Culture NO GROWTH 3 DAYS   Final   Report Status 08/27/2012 FINAL   Final  CLOSTRIDIUM DIFFICILE BY PCR     Status: None   Collection Time    08/26/12  9:53 AM      Result Value Range Status   C difficile by pcr NEGATIVE  NEGATIVE Final    Radiology Reports Dg Chest 2 View  07/29/2012   *RADIOLOGY REPORT*  Clinical Data: Shortness of breath, postop left leg surgery  CHEST - 2 VIEW  Comparison: Portable chest x-ray of 07/11/2012  Findings: The lungs are not optimally aerated with some elevation of the right hemidiaphragm and mild right basilar atelectasis. Moderate cardiomegaly is stable.  Resolution is limited due to body habitus.  IMPRESSION: Suboptimal aeration with elevation of the right hemidiaphragm. Stable moderate cardiomegaly.   Original Report Authenticated By: Dwyane Dee, M.D.   Dg Femur Left  08/20/2012   *RADIOLOGY REPORT*  Clinical Data: Swelling, redness, and pain starting around the left ankle and extending  up the left leg.  Recent fracture and surgery.  LEFT FEMUR - 2 VIEW  Comparison: 07/16/2012  Findings: Postoperative changes with lateral plate and screw fixation of a comminuted fracture of the distal left femoral metaphysis.  Fracture fragments appears stable in position since previous study.  Hardware components appear intact without change in position.  There is some loss of distinction of the fracture line and periosteal reaction suggesting healing.  No focal bone lesion or bone destruction is appreciated.  No cortical destruction or erosion. Soft tissue swelling distal to the patella may represent hematoma or edema.  Small left knee effusion.  No radiopaque soft tissue foreign body or soft tissue gas collections are demonstrated.  Vascular calcifications.  Left hip arthroplasty.  IMPRESSION: No specific evidence of osteomyelitis.  Plate and screw fixation of a healing comminuted transverse fracture of the  distal left femoral metaphysis.   Original Report Authenticated By: Burman Nieves, M.D.   Dg Tibia/fibula Left  08/20/2012   *RADIOLOGY REPORT*  Clinical Data: Swelling, redness, and pain starting around the left ankle and extending proximally of the left leg.  Recent fracture and surgery to the distal femur 07/10/2012.  LEFT TIBIA AND FIBULA - 2 VIEW  Comparison: Left femur 07/16/2012  Findings: AP views of the left tibia and fibula are obtained. There is focal periosteal reaction along the distal aspect of the left tibia and less promptly along the metaphyseal region of the distal tibia.  These changes could be due to healing nondisplaced fractures or stress fractures.  Osteomyelitis less likely.  There is no evidence of any cortical destruction or bone lucency.  No radiopaque soft tissue foreign bodies or soft tissue gas collections.  IMPRESSION: Periosteal reaction is demonstrated along the distal left tibia and fibula may represent healing fractures or stress fractures.  No specific evidence of osteomyelitis.  No soft tissue gas collections.   Original Report Authenticated By: Burman Nieves, M.D.    CBC  Recent Labs Lab 08/21/12 0510 08/26/12 0505  WBC 9.5 9.5  HGB 11.2* 10.4*  HCT 37.1* 35.6*  PLT 154 128*  MCV 90.3 91.5  MCH 27.3 26.7  MCHC 30.2 29.2*  RDW 17.5* 17.9*  LYMPHSABS 1.3  --   MONOABS 1.1*  --   EOSABS 0.2  --   BASOSABS 0.1  --     Chemistries   Recent Labs Lab 08/21/12 0510  08/23/12 0540 08/24/12 0450 08/25/12 0505 08/26/12 0505 08/27/12 0530  NA 138  < > 139 139 140 141 143  K 3.8  < > 3.5 3.5 3.6 3.4* 3.6  CL 96  < > 97 99 99 102 104  CO2 31  < > 29 30 30 29 28   GLUCOSE 110*  < > 96 92 125* 96 84  BUN 53*  < > 50* 51* 48* 43* 40*  CREATININE 3.40*  < > 3.63* 3.51* 3.58* 3.68* 3.80*  CALCIUM 8.7  < > 8.9 8.8 8.5 8.5 8.6  AST 21  --   --   --   --   --   --   ALT 10  --   --   --   --   --   --   ALKPHOS 60  --   --   --   --   --   --    BILITOT 0.4  --   --   --   --   --   --   < > = values in this interval not displayed. ------------------------------------------------------------------------------------------------------------------ estimated creatinine clearance is 29.8 ml/min (by C-G formula based on Cr of 3.8). ------------------------------------------------------------------------------------------------------------------ No results found for this basename: HGBA1C,  in the last 72 hours ------------------------------------------------------------------------------------------------------------------ No results found for this basename: CHOL, HDL, LDLCALC, TRIG, CHOLHDL, LDLDIRECT,  in the last 72 hours ------------------------------------------------------------------------------------------------------------------ No results found for this basename: TSH, T4TOTAL, FREET3, T3FREE, THYROIDAB,  in the last 72 hours ------------------------------------------------------------------------------------------------------------------ No results found for this basename: VITAMINB12, FOLATE, FERRITIN, TIBC, IRON, RETICCTPCT,  in the last 72 hours  Coagulation profile No results found for this basename: INR, PROTIME,  in the last 168 hours  No results found for this basename: DDIMER,  in the last 72 hours  Cardiac Enzymes No results found for this basename: CK, CKMB, TROPONINI, MYOGLOBIN,  in the last 168 hours ------------------------------------------------------------------------------------------------------------------ No components found with this basename: POCBNP,

## 2012-08-28 LAB — BASIC METABOLIC PANEL
BUN: 39 mg/dL — ABNORMAL HIGH (ref 6–23)
CO2: 23 mEq/L (ref 19–32)
Chloride: 101 mEq/L (ref 96–112)
Creatinine, Ser: 3.65 mg/dL — ABNORMAL HIGH (ref 0.50–1.35)
GFR calc Af Amer: 20 mL/min — ABNORMAL LOW (ref >90)
Glucose, Bld: 100 mg/dL — ABNORMAL HIGH (ref 70–99)
Potassium: 3.7 mEq/L (ref 3.5–5.1)

## 2012-08-28 MED ORDER — AMLODIPINE BESYLATE 5 MG PO TABS
5.0000 mg | ORAL_TABLET | Freq: Every day | ORAL | Status: DC
  Administered 2012-08-28 – 2012-08-30 (×3): 5 mg via ORAL
  Filled 2012-08-28 (×3): qty 1

## 2012-08-28 NOTE — Progress Notes (Signed)
[facility] Progress Note                                                                                Patient Demographics  Chad [name], is a 57 y.o. male, DOB - [birth year], ZOX:[id number], WJX:[id number]  Admit date - 08/19/2012  Admitting Physician Kathlen Mody, MD  Outpatient Primary MD for the patient is [name], MD  LOS - 9   Patient's care transferred to Dr. Toni Arthurs as attending on 08/26/2012 after discussions with him, we will continue to follow the patient daily for his medical problems.   Chief Complaint  Patient presents with  . Leg Swelling        Assessment & Plan    1. Postoperative wound infection of the left leg after initial ORIF of the left femur 4 weeks ago. He is currently on broad-spectrum antibiotics, status post incision and drainage by Dr. [name] on 08/24/2012, ID has been consulted for recommendations on long-term antibiotics. IJ PICC line has been placed for long-term antibiotics, ID is outlined antibiotics and duration, further care and discharge plan per orthopedics    2. 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 Last EF 35% on echogram done in December 2013 long with grade 2 diastolic dysfunction- he remains compensated from the standpoint, he is on hydralazine, Coreg, Lasix at home dose. Along with aspirin and amiodarone. Compensated, lasix held 08-27-12 again.     3. History of paroxysmal atrial fibrillation patient on amiodarone along with Coreg which will be continued. Also on home dose 81 mg aspirin.    4. Dyslipidemia on statin which is being continued.     5. History of renal transplant and chronic kidney disease stage V. Creatinine at baseline (4.4 to 4.6) , continue his home immunosuppressants unchanged. Creatinine slightly higher on it 02/16/2012, discussed in detail with nephrologist Dr. Eliott Nine On 08/27/2012, she recommended holding Lasix with IVF, BMP shows  improvement in renal function and now close to baseline, We'll continue to monitor renal function with you, monitor Vanco levels closely (pharm doing) .      6.H/O Hep C - stable.     7.HTN- Continue present regimen of beta blocker and hydralazine, I have added low-dose Norvasc for gentle reduction in blood pressure.   Code Status: full  Family Communication: none present  Disposition Plan: TBD   Procedures x-ray left to be a fibula, chest x-ray, I&D of the left leg by Dr. [name]   Consults  orthopedics and ID   DVT Prophylaxis  Heparin    Lab Results  Component Value Date   PLT 128* 08/26/2012    Medications  Scheduled Meds: . allopurinol  100 mg Oral BID  . amiodarone  200 mg Oral Daily  . amLODipine  5 mg Oral Daily  . aspirin EC  81 mg Oral Daily  . carvedilol  25 mg Oral BID WC  . heparin subcutaneous  5,000 Units Subcutaneous Q8H  . hydrALAZINE  50 mg Oral Q8H  . isosorbide mononitrate  60 mg Oral Daily  . meropenem (MERREM) IV  1 g Intravenous Q12H  . pantoprazole  40 mg Oral Daily  .  potassium chloride SA  20 mEq Oral Daily  . predniSONE  10 mg Oral Q breakfast  . simvastatin  20 mg Oral q1800  . sirolimus  1.2 mg Oral Daily  . sodium chloride  3 mL Intravenous Q12H  . vancomycin  1,500 mg Intravenous Q72H   Continuous Infusions: . sodium chloride 75 mL/hr at 08/28/12 0315   PRN Meds:.acetaminophen, acetaminophen, HYDROmorphone (DILAUDID) injection, meperidine (DEMEROL) injection, methocarbamol, ondansetron (ZOFRAN) IV, ondansetron, oxyCODONE, promethazine  Antibiotics    Anti-infectives   Start     Dose/Rate Route Frequency Ordered Stop   08/27/12 1200  meropenem (MERREM) 1 g in sodium chloride 0.9 % 100 mL IVPB     1 g 200 mL/hr over 30 Minutes Intravenous Every 12 hours 08/27/12 1133     08/26/12 1800  vancomycin (VANCOCIN) 1,500 mg in sodium chloride 0.9 % 500 mL IVPB     1,500 mg 250 mL/hr over 120 Minutes Intravenous every 72 hours  08/25/12 1900     08/21/12 1800  vancomycin (VANCOCIN) 1,500 mg in sodium chloride 0.9 % 500 mL IVPB  Status:  Discontinued     1,500 mg 250 mL/hr over 120 Minutes Intravenous Every 48 hours 08/19/12 1933 08/25/12 1753   08/20/12 0600  piperacillin-tazobactam (ZOSYN) IVPB 3.375 g  Status:  Discontinued     3.375 g 12.5 mL/hr over 240 Minutes Intravenous 3 times per day 08/20/12 0106 08/27/12 1026   08/20/12 0500  ciprofloxacin (CIPRO) IVPB 400 mg  Status:  Discontinued     400 mg 200 mL/hr over 60 Minutes Intravenous Every 24 hours 08/19/12 2350 08/20/12 0105   08/19/12 1700  piperacillin-tazobactam (ZOSYN) IVPB 3.375 g  Status:  Discontinued     3.375 g 12.5 mL/hr over 240 Minutes Intravenous  Once 08/19/12 1646 08/19/12 1647   08/19/12 1700  piperacillin-tazobactam (ZOSYN) IVPB 3.375 g     3.375 g 100 mL/hr over 30 Minutes Intravenous  Once 08/19/12 1648 08/19/12 2024   08/19/12 1700  vancomycin (VANCOCIN) 2,500 mg in sodium chloride 0.9 % 500 mL IVPB     2,500 mg 250 mL/hr over 120 Minutes Intravenous To Emergency Dept 08/19/12 1650 08/19/12 1949       Time Spent in minutes   35   Tali Cleaves K M.D on 08/28/2012 at 10:07 AM  Between 7am to 7pm - Pager - [phone]  After 7pm go to [email or web address] - password [email or web address]  And look for the night coverage person covering for me after hours  [facility] Office  [phone]    Subjective:   Chad [name] today has, No headache, No chest pain, No abdominal pain - No Nausea, No new weakness tingling or numbness, No Cough - SOB.    Objective:   Filed Vitals:   08/27/12 0550 08/27/12 1422 08/27/12 2112 08/28/12 0603  BP: 168/81 163/89 135/70 163/85  Pulse: 75 74 77 80  Temp: 98.4 F (36.9 C) 97.5 F (36.4 C) 98.1 F (36.7 C) 97.9 F (36.6 C)  TempSrc: Oral Oral Oral   Resp: 20 20 16 18   Height:      Weight:      SpO2: 90% 97% 91% 90%    Wt Readings from Last 3 Encounters:  08/25/12 123.1 kg (271 lb 6.2  oz)  08/25/12 123.1 kg (271 lb 6.2 oz)  08/05/12 129.7 kg (285 lb 15 oz)     Intake/Output Summary (Last 24 hours) at 08/28/12 1007 Last data filed at 08/28/12 0937  Gross per 24  hour  Intake   2715 ml  Output   1961 ml  Net    754 ml    Exam Awake Alert, Oriented X 3, No new F.N deficits, Normal affect [employer].AT,PERRAL Supple Neck,No JVD, No cervical lymphadenopathy appriciated.  Symmetrical Chest wall movement, Good air movement bilaterally, CTAB RRR,No Gallops,Rubs or new Murmurs, No Parasternal Heave +ve B.Sounds, Abd Soft, Non tender, No organomegaly appriciated, No rebound - guarding or rigidity. No Cyanosis, Clubbing or edema, No new Rash or bruise , left leg in bandage and splint  Data Review   Micro Results Recent Results (from the past 240 hour(s))  CULTURE, BLOOD (ROUTINE X 2)     Status: None   Collection Time    08/19/12  5:15 PM      Result Value Range Status   Specimen Description BLOOD ARM RIGHT   Final   Special Requests BOTTLES DRAWN AEROBIC ONLY [facility]   Final   Culture  Setup Time 08/19/2012 22:47   Final   Culture NO GROWTH 5 DAYS   Final   Report Status 08/25/2012 FINAL   Final  CULTURE, BLOOD (SINGLE)     Status: None   Collection Time    08/19/12  7:35 PM      Result Value Range Status   Specimen Description BLOOD ARM LEFT   Final   Special Requests BOTTLES DRAWN AEROBIC AND ANAEROBIC 10CC   Final   Culture  Setup Time 08/20/2012 03:45   Final   Culture NO GROWTH 5 DAYS   Final   Report Status 08/26/2012 FINAL   Final  WOUND CULTURE     Status: None   Collection Time    08/20/12 10:41 AM      Result Value Range Status   Specimen Description WOUND LEG LEFT   Final   Special Requests Immunocompromised   Final   Gram Stain     Final   Value: NO WBC SEEN     NO SQUAMOUS EPITHELIAL CELLS SEEN     RARE GRAM NEGATIVE RODS   Culture     Final   Value: ABUNDANT PROTEUS MIRABILIS     MODERATE ENTEROCOCCUS SPECIES   Report Status 08/24/2012 FINAL    Final   Organism ID, Bacteria PROTEUS MIRABILIS   Final   Organism ID, Bacteria ENTEROCOCCUS SPECIES   Final  SURGICAL PCR SCREEN     Status: None   Collection Time    08/23/12 11:01 PM      Result Value Range Status   MRSA, PCR NEGATIVE  NEGATIVE Final   Staphylococcus aureus NEGATIVE  NEGATIVE Final   Comment:            The Xpert SA Assay (FDA     approved for NASAL specimens     in patients over 29 years of age),     is one component of     a comprehensive surveillance     program.  Test performance has     been validated by [employer] for patients greater     than or equal to 39 year old.     It is not intended     to diagnose infection nor to     guide or monitor treatment.  ANAEROBIC CULTURE     Status: None   Collection Time    08/24/12  4:08 PM      Result Value Range Status   Specimen Description ABSCESS LEFT KNEE  Final   Special Requests PATIENT ON FOLLOWING VANCOMYCIN ZOSYN   Final   Gram Stain     Final   Value: NO WBC SEEN     NO SQUAMOUS EPITHELIAL CELLS SEEN     NO ORGANISMS SEEN   Culture     Final   Value: NO ANAEROBES ISOLATED; CULTURE IN PROGRESS FOR 5 DAYS   Report Status PENDING   Incomplete  CULTURE, ROUTINE-ABSCESS     Status: None   Collection Time    08/24/12  4:08 PM      Result Value Range Status   Specimen Description ABSCESS LEFT KNEE   Final   Special Requests PATIENT ON FOLLOWING VANCOMYCIN ZOSYN   Final   Gram Stain     Final   Value: NO WBC SEEN     NO SQUAMOUS EPITHELIAL CELLS SEEN     NO ORGANISMS SEEN   Culture NO GROWTH 3 DAYS   Final   Report Status 08/27/2012 FINAL   Final  TISSUE CULTURE     Status: None   Collection Time    08/24/12  4:08 PM      Result Value Range Status   Specimen Description TISSUE LEFT KNEE   Final   Special Requests PATIENT ON FOLLOWING VANCOMYCIN ZOSYN   Final   Gram Stain     Final   Value: NO WBC SEEN     NO SQUAMOUS EPITHELIAL CELLS SEEN     NO ORGANISMS SEEN   Culture NO GROWTH 3 DAYS    Final   Report Status 08/27/2012 FINAL   Final  CLOSTRIDIUM DIFFICILE BY PCR     Status: None   Collection Time    08/26/12  9:53 AM      Result Value Range Status   C difficile by pcr NEGATIVE  NEGATIVE Final    Radiology Reports Dg Chest 2 View  07/29/2012   *RADIOLOGY REPORT*  Clinical Data: Shortness of breath, postop left leg surgery  CHEST - 2 VIEW  Comparison: Portable chest x-ray of 07/11/2012  Findings: The lungs are not optimally aerated with some elevation of the right hemidiaphragm and mild right basilar atelectasis. Moderate cardiomegaly is stable.  Resolution is limited due to body habitus.  IMPRESSION: Suboptimal aeration with elevation of the right hemidiaphragm. Stable moderate cardiomegaly.   Original Report Authenticated By: Dwyane Dee, M.D.   Dg Femur Left  08/20/2012   *RADIOLOGY REPORT*  Clinical Data: Swelling, redness, and pain starting around the left ankle and extending  up the left leg.  Recent fracture and surgery.  LEFT FEMUR - 2 VIEW  Comparison: 07/16/2012  Findings: Postoperative changes with lateral plate and screw fixation of a comminuted fracture of the distal left femoral metaphysis.  Fracture fragments appears stable in position since previous study.  Hardware components appear intact without change in position.  There is some loss of distinction of the fracture line and periosteal reaction suggesting healing.  No focal bone lesion or bone destruction is appreciated.  No cortical destruction or erosion. Soft tissue swelling distal to the patella may represent hematoma or edema.  Small left knee effusion.  No radiopaque soft tissue foreign body or soft tissue gas collections are demonstrated.  Vascular calcifications.  Left hip arthroplasty.  IMPRESSION: No specific evidence of osteomyelitis.  Plate and screw fixation of a healing comminuted transverse fracture of the distal left femoral metaphysis.   Original Report Authenticated By: Burman Nieves, M.D.   Dg  Tibia/fibula Left  08/20/2012   *RADIOLOGY REPORT*  Clinical Data: Swelling, redness, and pain starting around the left ankle and extending proximally of the left leg.  Recent fracture and surgery to the distal femur 07/10/2012.  LEFT TIBIA AND FIBULA - 2 VIEW  Comparison: Left femur 07/16/2012  Findings: AP views of the left tibia and fibula are obtained. There is focal periosteal reaction along the distal aspect of the left tibia and less promptly along the metaphyseal region of the distal tibia.  These changes could be due to healing nondisplaced fractures or stress fractures.  Osteomyelitis less likely.  There is no evidence of any cortical destruction or bone lucency.  No radiopaque soft tissue foreign bodies or soft tissue gas collections.  IMPRESSION: Periosteal reaction is demonstrated along the distal left tibia and fibula may represent healing fractures or stress fractures.  No specific evidence of osteomyelitis.  No soft tissue gas collections.   Original Report Authenticated By: Burman Nieves, M.D.    CBC  Recent Labs Lab 08/26/12 0505  WBC 9.5  HGB 10.4*  HCT 35.6*  PLT 128*  MCV 91.5  MCH 26.7  MCHC 29.2*  RDW 17.9*    Chemistries   Recent Labs Lab 08/24/12 0450 08/25/12 0505 08/26/12 0505 08/27/12 0530 08/28/12 0607  NA 139 140 141 143 140  K 3.5 3.6 3.4* 3.6 3.7  CL 99 99 102 104 101  CO2 30 30 29 28 23   GLUCOSE 92 125* 96 84 100*  BUN 51* 48* 43* 40* 39*  CREATININE 3.51* 3.58* 3.68* 3.80* 3.65*  CALCIUM 8.8 8.5 8.5 8.6 8.4   ------------------------------------------------------------------------------------------------------------------ estimated creatinine clearance is 31 ml/min (by C-G formula based on Cr of 3.65). ------------------------------------------------------------------------------------------------------------------ No results found for this basename: HGBA1C,  in the last 72  hours ------------------------------------------------------------------------------------------------------------------ No results found for this basename: CHOL, HDL, LDLCALC, TRIG, CHOLHDL, LDLDIRECT,  in the last 72 hours ------------------------------------------------------------------------------------------------------------------ No results found for this basename: TSH, T4TOTAL, FREET3, T3FREE, THYROIDAB,  in the last 72 hours ------------------------------------------------------------------------------------------------------------------ No results found for this basename: VITAMINB12, FOLATE, FERRITIN, TIBC, IRON, RETICCTPCT,  in the last 72 hours  Coagulation profile No results found for this basename: INR, PROTIME,  in the last 168 hours  No results found for this basename: DDIMER,  in the last 72 hours  Cardiac Enzymes No results found for this basename: CK, CKMB, TROPONINI, MYOGLOBIN,  in the last 168 hours ------------------------------------------------------------------------------------------------------------------ No components found with this basename: POCBNP,

## 2012-08-28 NOTE — Progress Notes (Signed)
Subjective: 4 Days Post-Op Procedure(s) (LRB): LEFT LEG IRRIGATION AND DEBRIDEMENT WITH POSSIBLE WOUND VAC APPLICATION (Left) Patient reports pain as mild.    Objective: Vital signs in last 24 hours: Temp:  [97.5 F (36.4 C)-98.1 F (36.7 C)] 97.9 F (36.6 C) (08/02 0603) Pulse Rate:  [74-80] 80 (08/02 0603) Resp:  [16-20] 18 (08/02 0603) BP: (135-163)/(70-89) 163/85 mmHg (08/02 0603) SpO2:  [90 %-97 %] 90 % (08/02 0603)  Intake/Output from previous day: 08/01 0701 - 08/02 0700 In: 3871.3 [P.O.:1060; I.V.:2611.3; IV Piggyback:200] Out: 2112 [Urine:2075; Drains:35; Stool:2] Intake/Output this shift: Total I/O In: 360 [P.O.:360] Out: 200 [Urine:200]   Recent Labs  08/26/12 0505  HGB 10.4*    Recent Labs  08/26/12 0505  WBC 9.5  RBC 3.89*  HCT 35.6*  PLT 128*    Recent Labs  08/27/12 0530 08/28/12 0607  NA 143 140  K 3.6 3.7  CL 104 101  CO2 28 23  BUN 40* 39*  CREATININE 3.80* 3.65*  GLUCOSE 84 100*  CALCIUM 8.6 8.4   No results found for this basename: LABPT, INR,  in the last 72 hours  PE:  L thigh wound healing well.  Drain removed.  Scant serosang drainage.  Assessment/Plan: 4 Days Post-Op Procedure(s) (LRB): LEFT LEG IRRIGATION AND DEBRIDEMENT WITH POSSIBLE WOUND VAC APPLICATION (Left) Discharge home with home health  Pt will need wound VAC for at least a month.  He'll need HH RN for wound VAC changes and IV abx.  He'll also HH PT.  Hopefully d/c Monday when wound VAC is worked out.  Toni Arthurs 08/28/2012, 10:13 AM

## 2012-08-28 NOTE — Progress Notes (Signed)
    Subjective: 4 Days Post-Op Procedure(s) (LRB): LEFT LEG IRRIGATION AND DEBRIDEMENT WITH POSSIBLE WOUND VAC APPLICATION (Left) Patient reports pain as 2 on 0-10 scale.   Denies CP or SOB.  Voiding without difficulty. Positive flatus. Objective: Vital signs in last 24 hours: Temp:  [97.5 F (36.4 C)-98.1 F (36.7 C)] 97.9 F (36.6 C) (08/02 0603) Pulse Rate:  [74-80] 80 (08/02 0603) Resp:  [16-20] 18 (08/02 0603) BP: (135-163)/(70-89) 163/85 mmHg (08/02 0603) SpO2:  [90 %-97 %] 90 % (08/02 0603)  Intake/Output from previous day: 08/01 0701 - 08/02 0700 In: 3871.3 [P.O.:1060; I.V.:2611.3; IV Piggyback:200] Out: 2112 [Urine:2075; Drains:35; Stool:2] Intake/Output this shift:    Labs:  Recent Labs  08/26/12 0505  HGB 10.4*    Recent Labs  08/26/12 0505  WBC 9.5  RBC 3.89*  HCT 35.6*  PLT 128*    Recent Labs  08/27/12 0530 08/28/12 0607  NA 143 140  K 3.6 3.7  CL 104 101  CO2 28 23  BUN 40* 39*  CREATININE 3.80* 3.65*  GLUCOSE 84 100*  CALCIUM 8.6 8.4   No results found for this basename: LABPT, INR,  in the last 72 hours  Physical Exam: Neurologically intact Intact pulses distally Compartment soft VAC functioning well  Assessment/Plan: 4 Days Post-Op Procedure(s) (LRB): LEFT LEG IRRIGATION AND DEBRIDEMENT WITH POSSIBLE WOUND VAC APPLICATION (Left) Up with therapy Continue ABX therapy due to Post-op infection Abx regimen as outlined in ID note from yesterday  Tikisha Molinaro D for Dr. Venita Lick [facility] [phone] 08/28/2012, 8:29 AM

## 2012-08-29 LAB — BASIC METABOLIC PANEL
BUN: 40 mg/dL — ABNORMAL HIGH (ref 6–23)
CO2: 26 mEq/L (ref 19–32)
Chloride: 103 mEq/L (ref 96–112)
Creatinine, Ser: 3.54 mg/dL — ABNORMAL HIGH (ref 0.50–1.35)
Glucose, Bld: 105 mg/dL — ABNORMAL HIGH (ref 70–99)

## 2012-08-29 LAB — ANAEROBIC CULTURE: Gram Stain: NONE SEEN

## 2012-08-29 MED ORDER — FUROSEMIDE 80 MG PO TABS
80.0000 mg | ORAL_TABLET | Freq: Two times a day (BID) | ORAL | Status: DC
  Administered 2012-08-30: 80 mg via ORAL
  Filled 2012-08-29 (×2): qty 1

## 2012-08-29 MED ORDER — SODIUM CHLORIDE 0.9 % IJ SOLN
10.0000 mL | INTRAMUSCULAR | Status: DC | PRN
  Administered 2012-08-30: 10 mL

## 2012-08-29 NOTE — Progress Notes (Signed)
Subjective: 5 Days Post-Op Procedure(s) (LRB): LEFT LEG IRRIGATION AND DEBRIDEMENT WITH POSSIBLE WOUND VAC APPLICATION (Left) Patient reports pain as contolled.  Tolerating PO's well. No complaints this am. Denies SOB, CP, or sign of DVT. No C/n/V.   Objective: Vital signs in last 24 hours: Temp:  [98.1 F (36.7 C)-98.4 F (36.9 C)] 98.4 F (36.9 C) (08/03 0514) Pulse Rate:  [70-79] 79 (08/03 0514) Resp:  [16-19] 19 (08/03 0514) BP: (139-156)/(72-87) 156/87 mmHg (08/03 0514) SpO2:  [92 %-94 %] 92 % (08/03 0514)  Intake/Output from previous day: 08/02 0701 - 08/03 0700 In: 560 [P.O.:460; IV Piggyback:100] Out: 701 [Urine:700; Stool:1] Intake/Output this shift: Total I/O In: 480 [P.O.:480] Out: 175 [Urine:175]  No results found for this basename: HGB,  in the last 72 hours No results found for this basename: WBC, RBC, HCT, PLT,  in the last 72 hours  Recent Labs  08/28/12 0607 08/29/12 0510  NA 140 138  K 3.7 3.8  CL 101 103  CO2 23 26  BUN 39* 40*  CREATININE 3.65* 3.54*  GLUCOSE 100* 105*  CALCIUM 8.4 8.8   No results found for this basename: LABPT, INR,  in the last 72 hours  ALert and oriented. Sitting up chair. Wound vac in place to LLE with minimal drainage. Una boot in place. RLE neurovascularly in tact. Can plantar and dorsiflex without difficulty.   Assessment/Plan: 5 Days Post-Op Procedure(s) (LRB): LEFT LEG IRRIGATION AND DEBRIDEMENT WITH POSSIBLE WOUND VAC APPLICATION (Left) Continue current care. Wound vac in place Plan to D/C when ready with wound vac and ABX.  Chad [name] L 08/29/2012, 10:10 AM

## 2012-08-29 NOTE — Progress Notes (Signed)
[facility] Progress Note                                                                                Patient Demographics  Chad [name], is a 57 y.o. male, DOB - February 08, 1955, VWU:[id number], GNF:[id number]  Admit date - 08/19/2012  Admitting Physician Chad Mody, MD  Outpatient Primary MD for the patient is Chad Girt, MD  LOS - 10   Patient's care transferred to Dr. Toni [name] as attending on 08/26/2012 after discussions with him, we will continue to follow the patient daily for his medical problems.   Chief Complaint  Patient presents with  . Leg Swelling        Assessment & Plan    1. Postoperative wound infection of the left leg after initial ORIF of the left femur 4 weeks ago. He is currently on broad-spectrum antibiotics, status post incision and drainage by Dr. Victorino [name] on 08/24/2012, ID has been consulted for recommendations on long-term antibiotics. IJ PICC line has been placed for long-term antibiotics, ID is outlined antibiotics and duration, further care and discharge plan per orthopedics    2. 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 Last EF 35% on echogram done in December 2013 long with grade 2 diastolic dysfunction- he remains compensated from the standpoint, he is on hydralazine, Coreg, Lasix at home dose. Along with aspirin and amiodarone. Compensated, resumed at 80 mg by mouth twice a day from 08/30/2012.    3. History of paroxysmal atrial fibrillation patient on amiodarone along with Coreg which will be continued. Also on home dose 81 mg aspirin.    4. Dyslipidemia on statin which is being continued.     5. History of renal transplant and chronic kidney disease stage V. Creatinine at baseline (4.4 to 4.6) , continue his home immunosuppressants unchanged. Creatinine slightly higher on it 02/16/2012, he was given IV fluids for hydration and Lasix was held for a few days with good effects, his  renal function is almost at his baseline now, We'll continue to monitor renal function with you,   monitor Vanco levels closely (pharm doing) . We'll resume Lasix at 80 mg by mouth twice a day from 08/30/2012.     6.H/O Hep C - stable.     7.HTN- Continue present regimen of beta blocker and hydralazine, I have added low-dose Norvasc for gentle reduction in blood pressure.   Code Status: full  Family Communication: none present  Disposition Plan: TBD   Procedures x-ray left to be a fibula, chest x-ray, I&D of the left leg by Dr. Victorino [name]   Consults  orthopedics and ID   DVT Prophylaxis  Heparin    Lab Results  Component Value Date   PLT 128* 08/26/2012    Medications  Scheduled Meds: . allopurinol  100 mg Oral BID  . amiodarone  200 mg Oral Daily  . amLODipine  5 mg Oral Daily  . aspirin EC  81 mg Oral Daily  . carvedilol  25 mg Oral BID WC  . [START ON 08/30/2012] furosemide  80 mg Oral BID  . heparin subcutaneous  5,000 Units Subcutaneous Q8H  . hydrALAZINE  50  mg Oral Q8H  . isosorbide mononitrate  60 mg Oral Daily  . meropenem (MERREM) IV  1 g Intravenous Q12H  . pantoprazole  40 mg Oral Daily  . potassium chloride SA  20 mEq Oral Daily  . predniSONE  10 mg Oral Q breakfast  . simvastatin  20 mg Oral q1800  . sirolimus  1.2 mg Oral Daily  . sodium chloride  3 mL Intravenous Q12H  . vancomycin  1,500 mg Intravenous Q72H   Continuous Infusions:   PRN Meds:.acetaminophen, acetaminophen, methocarbamol, ondansetron (ZOFRAN) IV, ondansetron, oxyCODONE, sodium chloride  Antibiotics    Anti-infectives   Start     Dose/Rate Route Frequency Ordered Stop   08/27/12 1200  meropenem (MERREM) 1 g in sodium chloride 0.9 % 100 mL IVPB     1 g 200 mL/hr over 30 Minutes Intravenous Every 12 hours 08/27/12 1133     08/26/12 1800  vancomycin (VANCOCIN) 1,500 mg in sodium chloride 0.9 % 500 mL IVPB     1,500 mg 250 mL/hr over 120 Minutes Intravenous every 72 hours 08/25/12  1900     08/21/12 1800  vancomycin (VANCOCIN) 1,500 mg in sodium chloride 0.9 % 500 mL IVPB  Status:  Discontinued     1,500 mg 250 mL/hr over 120 Minutes Intravenous Every 48 hours 08/19/12 1933 08/25/12 1753   08/20/12 0600  piperacillin-tazobactam (ZOSYN) IVPB 3.375 g  Status:  Discontinued     3.375 g 12.5 mL/hr over 240 Minutes Intravenous 3 times per day 08/20/12 0106 08/27/12 1026   08/20/12 0500  ciprofloxacin (CIPRO) IVPB 400 mg  Status:  Discontinued     400 mg 200 mL/hr over 60 Minutes Intravenous Every 24 hours 08/19/12 2350 08/20/12 0105   08/19/12 1700  piperacillin-tazobactam (ZOSYN) IVPB 3.375 g  Status:  Discontinued     3.375 g 12.5 mL/hr over 240 Minutes Intravenous  Once 08/19/12 1646 08/19/12 1647   08/19/12 1700  piperacillin-tazobactam (ZOSYN) IVPB 3.375 g     3.375 g 100 mL/hr over 30 Minutes Intravenous  Once 08/19/12 1648 08/19/12 2024   08/19/12 1700  vancomycin (VANCOCIN) 2,500 mg in sodium chloride 0.9 % 500 mL IVPB     2,500 mg 250 mL/hr over 120 Minutes Intravenous To Emergency Dept 08/19/12 1650 08/19/12 1949       Time Spent in minutes   35   Chad [name] on 08/29/2012 at 12:23 PM  Between 7am to 7pm - Pager - [phone]  After 7pm go to [email or web address] - password [email or web address]  And look for the night coverage person covering for me after hours  [facility] Office  [phone]    Subjective:   Chad [name] today has, No headache, No chest pain, No abdominal pain - No Nausea, No new weakness tingling or numbness, No Cough - SOB.    Objective:   Filed Vitals:   08/28/12 1400 08/28/12 2100 08/28/12 2146 08/29/12 0514  BP: 139/72 151/86 151/86 156/87  Pulse: 70 72 72 79  Temp: 98.1 F (36.7 C)  98.4 F (36.9 C) 98.4 F (36.9 C)  TempSrc: Oral  Oral   Resp: 16  19 19   Height:      Weight:      SpO2: 94%  93% 92%    Wt Readings from Last 3 Encounters:  08/25/12 123.1 kg (271 lb 6.2 oz)  08/25/12 123.1 kg (271 lb 6.2  oz)  08/05/12 129.7 kg (285 lb 15 oz)     Intake/Output Summary (  Last 24 hours) at 08/29/12 1223 Last data filed at 08/29/12 0955  Gross per 24 hour  Intake    680 ml  Output    675 ml  Net      5 ml    Exam Awake Alert, Oriented X 3, No new F.N deficits, Normal affect [place].AT,PERRAL Supple Neck,No JVD, No cervical lymphadenopathy appriciated.  Symmetrical Chest wall movement, Good air movement bilaterally, CTAB RRR,No Gallops,Rubs or new Murmurs, No Parasternal Heave +ve B.Sounds, Abd Soft, Non tender, No organomegaly appriciated, No rebound - guarding or rigidity. No Cyanosis, Clubbing or edema, No new Rash or bruise , left leg in bandage and splint  Data Review   Micro Results Recent Results (from the past 240 hour(s))  CULTURE, BLOOD (ROUTINE X 2)     Status: None   Collection Time    08/19/12  5:15 PM      Result Value Range Status   Specimen Description BLOOD ARM RIGHT   Final   Special Requests BOTTLES DRAWN AEROBIC ONLY [facility]   Final   Culture  Setup Time 08/19/2012 22:47   Final   Culture NO GROWTH 5 DAYS   Final   Report Status 08/25/2012 FINAL   Final  CULTURE, BLOOD (SINGLE)     Status: None   Collection Time    08/19/12  7:35 PM      Result Value Range Status   Specimen Description BLOOD ARM LEFT   Final   Special Requests BOTTLES DRAWN AEROBIC AND ANAEROBIC 10CC   Final   Culture  Setup Time 08/20/2012 03:45   Final   Culture NO GROWTH 5 DAYS   Final   Report Status 08/26/2012 FINAL   Final  WOUND CULTURE     Status: None   Collection Time    08/20/12 10:41 AM      Result Value Range Status   Specimen Description WOUND LEG LEFT   Final   Special Requests Immunocompromised   Final   Gram Stain     Final   Value: NO WBC SEEN     NO SQUAMOUS EPITHELIAL CELLS SEEN     RARE GRAM NEGATIVE RODS   Culture     Final   Value: ABUNDANT PROTEUS MIRABILIS     MODERATE ENTEROCOCCUS SPECIES   Report Status 08/24/2012 FINAL   Final   Organism ID, Bacteria PROTEUS  MIRABILIS   Final   Organism ID, Bacteria ENTEROCOCCUS SPECIES   Final  SURGICAL PCR SCREEN     Status: None   Collection Time    08/23/12 11:01 PM      Result Value Range Status   MRSA, PCR NEGATIVE  NEGATIVE Final   Staphylococcus aureus NEGATIVE  NEGATIVE Final   Comment:            The Xpert SA Assay (FDA     approved for NASAL specimens     in patients over 31 years of age),     is one component of     a comprehensive surveillance     program.  Test performance has     been validated by [employer] for patients greater     than or equal to 2 year old.     It is not intended     to diagnose infection nor to     guide or monitor treatment.  ANAEROBIC CULTURE     Status: None   Collection Time    08/24/12  4:08 PM      Result Value Range Status   Specimen Description ABSCESS LEFT KNEE   Final   Special Requests PATIENT ON FOLLOWING VANCOMYCIN ZOSYN   Final   Gram Stain     Final   Value: NO WBC SEEN     NO SQUAMOUS EPITHELIAL CELLS SEEN     NO ORGANISMS SEEN   Culture NO ANAEROBES ISOLATED   Final   Report Status 08/29/2012 FINAL   Final  CULTURE, ROUTINE-ABSCESS     Status: None   Collection Time    08/24/12  4:08 PM      Result Value Range Status   Specimen Description ABSCESS LEFT KNEE   Final   Special Requests PATIENT ON FOLLOWING VANCOMYCIN ZOSYN   Final   Gram Stain     Final   Value: NO WBC SEEN     NO SQUAMOUS EPITHELIAL CELLS SEEN     NO ORGANISMS SEEN   Culture NO GROWTH 3 DAYS   Final   Report Status 08/27/2012 FINAL   Final  TISSUE CULTURE     Status: None   Collection Time    08/24/12  4:08 PM      Result Value Range Status   Specimen Description TISSUE LEFT KNEE   Final   Special Requests PATIENT ON FOLLOWING VANCOMYCIN ZOSYN   Final   Gram Stain     Final   Value: NO WBC SEEN     NO SQUAMOUS EPITHELIAL CELLS SEEN     NO ORGANISMS SEEN   Culture NO GROWTH 3 DAYS   Final   Report Status 08/27/2012 FINAL   Final  CLOSTRIDIUM DIFFICILE BY  PCR     Status: None   Collection Time    08/26/12  9:53 AM      Result Value Range Status   C difficile by pcr NEGATIVE  NEGATIVE Final    Radiology Reports Dg Chest 2 View  07/29/2012   *RADIOLOGY REPORT*  Clinical Data: Shortness of breath, postop left leg surgery  CHEST - 2 VIEW  Comparison: Portable chest x-ray of 07/11/2012  Findings: The lungs are not optimally aerated with some elevation of the right hemidiaphragm and mild right basilar atelectasis. Moderate cardiomegaly is stable.  Resolution is limited due to body habitus.  IMPRESSION: Suboptimal aeration with elevation of the right hemidiaphragm. Stable moderate cardiomegaly.   Original Report Authenticated By: Dwyane Dee, [name].   Dg Femur Left  08/20/2012   *RADIOLOGY REPORT*  Clinical Data: Swelling, redness, and pain starting around the left ankle and extending  up the left leg.  Recent fracture and surgery.  LEFT FEMUR - 2 VIEW  Comparison: 07/16/2012  Findings: Postoperative changes with lateral plate and screw fixation of a comminuted fracture of the distal left femoral metaphysis.  Fracture fragments appears stable in position since previous study.  Hardware components appear intact without change in position.  There is some loss of distinction of the fracture line and periosteal reaction suggesting healing.  No focal bone lesion or bone destruction is appreciated.  No cortical destruction or erosion. Soft tissue swelling distal to the patella may represent hematoma or edema.  Small left knee effusion.  No radiopaque soft tissue foreign body or soft tissue gas collections are demonstrated.  Vascular calcifications.  Left hip arthroplasty.  IMPRESSION: No specific evidence of osteomyelitis.  Plate and screw fixation of a healing comminuted transverse fracture of the distal left femoral metaphysis.   Original Report Authenticated By:  Burman Nieves, [name].   Dg Tibia/fibula Left  08/20/2012   *RADIOLOGY REPORT*  Clinical Data: Swelling,  redness, and pain starting around the left ankle and extending proximally of the left leg.  Recent fracture and surgery to the distal femur 07/10/2012.  LEFT TIBIA AND FIBULA - 2 VIEW  Comparison: Left femur 07/16/2012  Findings: AP views of the left tibia and fibula are obtained. There is focal periosteal reaction along the distal aspect of the left tibia and less promptly along the metaphyseal region of the distal tibia.  These changes could be due to healing nondisplaced fractures or stress fractures.  Osteomyelitis less likely.  There is no evidence of any cortical destruction or bone lucency.  No radiopaque soft tissue foreign bodies or soft tissue gas collections.  IMPRESSION: Periosteal reaction is demonstrated along the distal left tibia and fibula may represent healing fractures or stress fractures.  No specific evidence of osteomyelitis.  No soft tissue gas collections.   Original Report Authenticated By: Burman Nieves, [name].    CBC  Recent Labs Lab 08/26/12 0505  WBC 9.5  HGB 10.4*  HCT 35.6*  PLT 128*  MCV 91.5  MCH 26.7  MCHC 29.2*  RDW 17.9*    Chemistries   Recent Labs Lab 08/25/12 0505 08/26/12 0505 08/27/12 0530 08/28/12 0607 08/29/12 0510  NA 140 141 143 140 138  K 3.6 3.4* 3.6 3.7 3.8  CL 99 102 104 101 103  CO2 30 29 28 23 26   GLUCOSE 125* 96 84 100* 105*  BUN 48* 43* 40* 39* 40*  CREATININE 3.58* 3.68* 3.80* 3.65* 3.54*  CALCIUM 8.5 8.5 8.6 8.4 8.8   ------------------------------------------------------------------------------------------------------------------ estimated creatinine clearance is 31.9 ml/min (by C-G formula based on Cr of 3.54). ------------------------------------------------------------------------------------------------------------------ No results found for this basename: HGBA1C,  in the last 72 hours ------------------------------------------------------------------------------------------------------------------ No results found  for this basename: CHOL, HDL, LDLCALC, TRIG, CHOLHDL, LDLDIRECT,  in the last 72 hours ------------------------------------------------------------------------------------------------------------------ No results found for this basename: TSH, T4TOTAL, FREET3, T3FREE, THYROIDAB,  in the last 72 hours ------------------------------------------------------------------------------------------------------------------ No results found for this basename: VITAMINB12, FOLATE, FERRITIN, TIBC, IRON, RETICCTPCT,  in the last 72 hours  Coagulation profile No results found for this basename: INR, PROTIME,  in the last 168 hours  No results found for this basename: DDIMER,  in the last 72 hours  Cardiac Enzymes No results found for this basename: CK, CKMB, TROPONINI, MYOGLOBIN,  in the last 168 hours ------------------------------------------------------------------------------------------------------------------ No components found with this basename: POCBNP,

## 2012-08-30 ENCOUNTER — Ambulatory Visit: Payer: Self-pay | Admitting: Surgery

## 2012-08-30 LAB — BASIC METABOLIC PANEL
BUN: 39 mg/dL — ABNORMAL HIGH (ref 6–23)
CO2: 25 mEq/L (ref 19–32)
Calcium: 8.7 mg/dL (ref 8.4–10.5)
Glucose, Bld: 85 mg/dL (ref 70–99)
Sodium: 140 mEq/L (ref 135–145)

## 2012-08-30 MED ORDER — HEPARIN SOD (PORK) LOCK FLUSH 100 UNIT/ML IV SOLN
250.0000 [IU] | INTRAVENOUS | Status: AC | PRN
  Administered 2012-08-30: 500 [IU]

## 2012-08-30 MED ORDER — OXYCODONE HCL 5 MG PO TABS: 5.0000 mg | ORAL_TABLET | ORAL | Status: DC | PRN

## 2012-08-30 MED ORDER — SODIUM CHLORIDE 0.9 % IV SOLN: 1.0000 g | Freq: Two times a day (BID) | INTRAVENOUS | Status: DC

## 2012-08-30 MED ORDER — VANCOMYCIN HCL 10 G IV SOLR: 1500.0000 mg | INTRAVENOUS | Status: DC

## 2012-08-30 NOTE — Progress Notes (Signed)
Physical Therapy Treatment Patient Details Name: Chad [name] MRN: [id number] DOB: 07/04/1955 Today's Date: 08/30/2012 Time: 4098-1191 PT Time Calculation (min): 25 min  PT Assessment / Plan / Recommendation  History of Present Illness 57 y/o male now almost 6 weeks post op from ORIF of left supracondylar femur fracture. He presented to clinic yesterday with swelling of the left lower extremity. Negative for DVT; +cellulitis. He has been at home now for two weeks after spending several weeks at [facility]. He has been NWB on the L LE. s/p I&D 08/24/12 and continues to be NWB.    PT Comments   Pt making steady progress with therapy. Reports he will have (A) from father and son when D/C home. Pt encouraged to have (A) at all times with ambulation at home. Pt verbalized understanding.   Follow Up Recommendations  Home health PT;Supervision for mobility/OOB     Does the patient have the potential to tolerate intense rehabilitation     Barriers to Discharge        Equipment Recommendations  Rolling walker with 5" wheels    Recommendations for Other Services    Frequency Min 3X/week   Progress towards PT Goals Progress towards PT goals: Progressing toward goals  Plan Current plan remains appropriate    Precautions / Restrictions Precautions Precautions: Fall Required Braces or Orthoses: Other Brace/Splint Other Brace/Splint: hinged bledsoe brace Lt LE  Restrictions Weight Bearing Restrictions: Yes LLE Weight Bearing: Non weight bearing   Pertinent Vitals/Pain 7/10; premedicated.     Mobility  Bed Mobility Bed Mobility: Not assessed Details for Bed Mobility Assistance: pt sitting on EOB and returned to wheelchair Transfers Transfers: Sit to Stand;Stand to Sit;Stand Pivot Transfers Sit to Stand: 4: Min guard;From chair/3-in-1;Other (comment) (from wheelchair ) Stand to Sit: 4: Min guard;To chair/3-in-1;With armrests;Other (comment) (to wheelchair ) Stand Pivot Transfers: 5:  Supervision;From elevated surface;With armrests Details for Transfer Assistance: pt performed semi stand pivot transfer from bed to [facility] with setup (A) only; for full sit to stand and stand to sit transfers pt requires min guard for safety to balance and maintain NWB status; pt demo good safety awareness with hand placement  Ambulation/Gait Ambulation/Gait Assistance: 4: Min guard Ambulation Distance (Feet): 10 Feet (x2) Assistive device: Rolling walker Ambulation/Gait Assistance Details: min guard to steady; pt able to maintain NWB status on Lt LE, has tendency to rest Lt LE on ground due to fatigue; pt requires increased time to amb; 1 standing rest break due to fatigue; demo SOB  Gait Pattern: Step-to pattern Gait velocity: very decreased  Stairs: No Wheelchair Mobility Wheelchair Mobility: Yes Wheelchair Assistance: 6: Modified independent (Device/Increase time) Wheelchair Parts Management: Independent (pt independent with brake management )    Exercises     PT Diagnosis:    PT Problem List:   PT Treatment Interventions:     PT Goals (current goals can now be found in the care plan section) Acute Rehab PT Goals Patient Stated Goal: to go home today if all equipment is here PT Goal Formulation: With patient Time For Goal Achievement: 09/01/12 Potential to Achieve Goals: Good  Visit Information  Last PT Received On: 08/30/12 Assistance Needed: +1 History of Present Illness: 57 y/o male now almost 6 weeks post op from ORIF of left supracondylar femur fracture. He presented to clinic yesterday with swelling of the left lower extremity. Negative for DVT; +cellulitis. He has been at home now for two weeks after spending several weeks at [facility]. He has been  NWB on the L LE. s/p I&D 08/24/12 and continues to be NWB.     Subjective Data  Subjective: Pt requesting BSC and bird bath setup; agreeable to ambulating within room  Patient Stated Goal: to go home today if all equipment is here    Cognition  Cognition Arousal/Alertness: Awake/alert Behavior During Therapy: WFL for tasks assessed/performed Overall Cognitive Status: Within Functional Limits for tasks assessed    Balance     End of Session PT - End of Session Equipment Utilized During Treatment: Gait belt;Other (comment) (bledsoe brace) Activity Tolerance: Patient tolerated treatment well Patient left: Other (comment);with call bell/phone within reach (in wheelchair ) Nurse Communication: Mobility status   GP     Donell Sievert, [place] [phone] 08/30/2012, 12:45 PM

## 2012-08-30 NOTE — Progress Notes (Signed)
Occupational Therapy Treatment Patient Details Name: TIERRA DIVELBISS MRN: [id number] DOB: 12-06-1955 Today's Date: 08/30/2012 Time: 1202-1218 OT Time Calculation (min): 16 min  OT Assessment / Plan / Recommendation  History of present illness 58 y/o male now almost 6 weeks post op from ORIF of left supracondylar femur fracture. He presented to clinic yesterday with swelling of the left lower extremity. Negative for DVT; +cellulitis. He has been at home now for two weeks after spending several weeks at [facility]. He has been NWB on the L LE. s/p I&D 08/24/12 and continues to be NWB.    OT comments  Pt preparing for discharge and eager to go home.  He reports no concerns/issues in regards to ADLs/discharge.  Pt was instructed to perform wheelchair push ups to increase bil. UE strength and was able to return demonstration   Follow Up Recommendations  No OT follow up    Barriers to Discharge       Equipment Recommendations  None recommended by OT    Recommendations for Other Services    Frequency Min 2X/week   Progress towards OT Goals    Plan Discharge plan remains appropriate    Precautions / Restrictions Precautions Precautions: Fall Required Braces or Orthoses: Other Brace/Splint Other Brace/Splint: hinged bledsoe brace Lt LE  Restrictions Weight Bearing Restrictions: Yes LLE Weight Bearing: Non weight bearing   Pertinent Vitals/Pain     ADL  Transfers/Ambulation Related to ADLs: Pt deferred toilet transfer ADL Comments: Pt instructed to perform wheelchair push ups 15 reps 2x/day and was able to return demonstration.  Pt eager to discharge home     OT Diagnosis:    OT Problem List:   OT Treatment Interventions:     OT Goals(current goals can now be found in the care plan section) Acute Rehab OT Goals Patient Stated Goal: to go home today if all equipment is here OT Goal Formulation: With patient Time For Goal Achievement: 09/10/12 ADL Goals Pt Will Transfer to Toilet: with  modified independence;bedside commode Pt Will Perform Toileting - Clothing Manipulation and hygiene: with modified independence;sitting/lateral leans Pt/caregiver will Perform Home Exercise Program: For increased strengthening;Independently  Visit Information  Last OT Received On: 08/30/12 Assistance Needed: +1 History of Present Illness: 57 y/o male now almost 6 weeks post op from ORIF of left supracondylar femur fracture. He presented to clinic yesterday with swelling of the left lower extremity. Negative for DVT; +cellulitis. He has been at home now for two weeks after spending several weeks at [facility]. He has been NWB on the L LE. s/p I&D 08/24/12 and continues to be NWB.     Subjective Data      Prior Functioning       Cognition  Cognition Arousal/Alertness: Awake/alert Behavior During Therapy: WFL for tasks assessed/performed Overall Cognitive Status: Within Functional Limits for tasks assessed    Mobility  Bed Mobility Bed Mobility: Not assessed Details for Bed Mobility Assistance: pt sitting on EOB and returned to wheelchair Transfers Sit to Stand: 4: Min guard;From chair/3-in-1;Other (comment) (from wheelchair ) Stand to Sit: 4: Min guard;To chair/3-in-1;With armrests;Other (comment) (to wheelchair ) Details for Transfer Assistance: pt performed semi stand pivot transfer from bed to [facility] with setup (A) only; for full sit to stand and stand to sit transfers pt requires min guard for safety to balance and maintain NWB status; pt demo good safety awareness with hand placement     Exercises  General Exercises - Upper Extremity Chair Push Up: 15 reps;Seated  Balance     End of Session OT - End of Session Activity Tolerance: Patient tolerated treatment well Patient left: in chair;with call bell/phone within reach  GO     Alisah Grandberry M 08/30/2012, 12:46 PM

## 2012-08-30 NOTE — Progress Notes (Signed)
Discharge instructions reviewed with pt and prescriptions given.  Pt verbalized understanding and had no questions.  Wound VAC changed to portable machine and HHRN arranged.  Pt discharged in stable condition via wheelchair with family.  Hector Shade [place]

## 2012-08-30 NOTE — Discharge Summary (Signed)
Physician Discharge Summary  Patient ID: Chad [name] MRN: [id number] DOB/AGE: Sep 02, 1955 57 y.o.  Admit date: 08/19/2012 Discharge date: 08/30/2012  Admission Diagnoses:  ESRD s/p renal transplant, a fib, left femur fracture s/p ORIF, left leg cellulitis  Discharge Diagnoses:  Principal Problem:   Postoperative wound infection Active Problems:   Chronic systolic CHF (congestive heart failure)   CKD (chronic kidney disease)   Hypertension   PAF (paroxysmal atrial fibrillation)   History of renal transplant   Diarrhea   Fracture, femur, distal   Discharged Condition: stable  Hospital Course: Pt was admitted on 7/24 for concern of left LE DVT v. Cellulitis.  U/S was neg for DVT.  Pt was started on abx.  His appearance did not significantly improve with several days of abx.  He was taken to the OR and underwent I and D of the deep incision adjacent to the plate and I and D of a medial leg abscess.  He had a wound VAC placed and changed for the remainder of his hospital stay.  Ultimately he was placed on IV vanc and rocephin for treatment of this deep infection.  He will be on abx for 6 weeks then change to PO for chronic suppression until he can have the plate removed.  He is discharged to home in stable condition, NWB on the left lower extremity.    Consults: ID  Significant Diagnostic Studies: microbiology: wound culture: positive  Treatments: surgery: as above  Discharge Exam: Blood pressure 142/82, pulse 84, temperature 98.1 F (36.7 C), temperature source Oral, resp. rate 18, height 6' 0.84" (1.85 m), weight 126.2 kg (278 lb 3.5 oz), SpO2 92.00%. wn wd male in and.  L thigh wounds healing with wound VAC medially.  Skin o/w intact.  5/5 strrength in quad and hamstring.  Disposition: 01-Home or Self Care  Discharge Orders   Future Appointments Provider Department Dept Phone   09/16/2012 2:00 PM Cliffton Asters, MD [facility] [phone]   Future Orders Complete By Expires     Call MD / Call 911  As directed     Comments:      If you experience chest pain or shortness of breath, CALL 911 and be transported to the hospital emergency room.  If you develope a fever above 101 F, pus (white drainage) or increased drainage or redness at the wound, or calf pain, call your surgeon's office.    Constipation Prevention  As directed     Comments:      Drink plenty of fluids.  Prune juice may be helpful.  You may use a stool softener, such as Colace (over the counter) 100 mg twice a day.  Use MiraLax (over the counter) for constipation as needed.    Diet - low sodium heart healthy  As directed     Increase activity slowly as tolerated  As directed         Medication List    STOP taking these medications       aspirin EC 81 MG tablet      TAKE these medications       allopurinol 100 MG tablet  Commonly known as:  ZYLOPRIM  Take 1 tablet (100 mg total) by mouth 2 (two) times daily.     amiodarone 200 MG tablet  Commonly known as:  PACERONE  Take 1 tablet (200 mg total) by mouth daily.     carvedilol 25 MG tablet  Commonly known as:  COREG  Take 1 tablet (25 mg total) by mouth 2 (two) times daily with a meal.     furosemide 20 MG tablet  Commonly known as:  LASIX  Take 5 tablets (100 mg total) by mouth 2 (two) times daily.     hydrALAZINE 50 MG tablet  Commonly known as:  APRESOLINE  Take 1 tablet (50 mg total) by mouth 3 (three) times daily.     isosorbide mononitrate 60 MG 24 hr tablet  Commonly known as:  IMDUR  Take 1 tablet (60 mg total) by mouth daily.     methocarbamol 500 MG tablet  Commonly known as:  ROBAXIN  Take 500 mg by mouth every 6 (six) hours as needed (For muscle pain).     omeprazole 20 MG capsule  Commonly known as:  PRILOSEC  Take 1 capsule (20 mg total) by mouth daily.     oxyCODONE 5 MG immediate release tablet  Commonly known as:  Oxy IR/ROXICODONE  Take 5-10 mg by mouth every 3 (three)  hours as needed for pain.     oxyCODONE 5 MG immediate release tablet  Commonly known as:  Oxy IR/ROXICODONE  Take 1-2 tablets (5-10 mg total) by mouth every 3 (three) hours as needed for pain.     potassium chloride SA 20 MEQ tablet  Commonly known as:  K-DUR,KLOR-CON  Take 1 tablet (20 mEq total) by mouth daily.     pravastatin 40 MG tablet  Commonly known as:  PRAVACHOL  Take 1 tablet (40 mg total) by mouth daily.     predniSONE 10 MG tablet  Commonly known as:  DELTASONE  Take 1 tablet (10 mg total) by mouth daily.     sirolimus 1 MG/ML solution  Commonly known as:  RAPAMUNE  Take 1.2 mg by mouth daily.     sodium chloride 0.9 % SOLN 100 mL with meropenem 1 G SOLR 1 g  Inject 1 g into the vein every 12 (twelve) hours.     sodium chloride 0.9 % SOLN 500 mL with vancomycin 10 G SOLR 1,500 mg  Inject 1,500 mg into the vein every 3 (three) days.           Follow-up Information   Follow up with Toni Arthurs, MD. Call in 2 weeks.   Contact information:   [address] [place] [address] [phone]       Follow up with Acey Lav, MD. Schedule an appointment as soon as possible for a visit in 2 weeks.   Contact information:   [address] [place] [address] [phone]       Signed: Toni Arthurs 08/30/2012, 3:16 PM

## 2012-08-30 NOTE — Progress Notes (Signed)
[facility] Progress Note                                                                                Patient Demographics  Chad [name], is a 57 y.o. male, DOB - 04/09/1955, ZOX:[id number], WJX:[id number]  Admit date - 08/19/2012  Admitting Physician Kathlen Mody, MD  Outpatient Primary MD for the patient is [name], MD  LOS - 11   Patient's care transferred to Dr. Toni Arthurs as attending on 08/26/2012 after discussions with him, we will continue to follow the patient daily for his medical problems.   Chief Complaint  Patient presents with  . Leg Swelling        Assessment & Plan    1. Postoperative wound infection of the left leg after initial ORIF of the left femur 4 weeks ago. He is currently on broad-spectrum antibiotics, status post incision and drainage by Dr. [name] on 08/24/2012, ID has been consulted for recommendations on long-term antibiotics. IJ PICC line has been placed for long-term antibiotics, ID has outlined antibiotics and duration, further care and discharge plan per orthopedics    2. 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 Last EF 35% on echogram done in December 2013 long with grade 2 diastolic dysfunction- he remains compensated from the standpoint, he is on hydralazine, Coreg, Lasix at home dose. Along with aspirin and amiodarone. Compensated, resumed at 80 mg by mouth twice a day from 08/30/2012.    3. History of paroxysmal atrial fibrillation patient on amiodarone along with Coreg which will be continued. Also on home dose 81 mg aspirin.    4. Dyslipidemia on statin which is being continued.     5. History of renal transplant and chronic kidney disease stage V. Creatinine at baseline (4.4 to 4.6) , continue his home immunosuppressants unchanged. Creatinine slightly higher on it 02/16/2012, he was given IV fluids for hydration and Lasix was held for a few days with good effects, his  renal function is almost at his baseline now, We'll continue to monitor renal function with you,   monitor Vanco levels closely (pharm doing) . We'll resume Lasix at 80 mg by mouth twice a day from 08/30/2012.     6.H/O Hep C - stable.     7.HTN- Continue present regimen of beta blocker and hydralazine, I have added low-dose Norvasc for gentle reduction in blood pressure.   Code Status: full  Family Communication: none present  Disposition Plan: TBD   Procedures x-ray left to be a fibula, chest x-ray, I&D of the left leg by Dr. [name]   Consults  orthopedics and ID   DVT Prophylaxis  Heparin    Lab Results  Component Value Date   PLT 128* 08/26/2012    Medications  Scheduled Meds: . allopurinol  100 mg Oral BID  . amiodarone  200 mg Oral Daily  . amLODipine  5 mg Oral Daily  . aspirin EC  81 mg Oral Daily  . carvedilol  25 mg Oral BID WC  . furosemide  80 mg Oral BID  . heparin subcutaneous  5,000 Units Subcutaneous Q8H  . hydrALAZINE  50 mg Oral Q8H  .  isosorbide mononitrate  60 mg Oral Daily  . meropenem (MERREM) IV  1 g Intravenous Q12H  . pantoprazole  40 mg Oral Daily  . potassium chloride SA  20 mEq Oral Daily  . predniSONE  10 mg Oral Q breakfast  . simvastatin  20 mg Oral q1800  . sirolimus  1.2 mg Oral Daily  . sodium chloride  3 mL Intravenous Q12H  . vancomycin  1,500 mg Intravenous Q72H   Continuous Infusions:   PRN Meds:.acetaminophen, acetaminophen, methocarbamol, ondansetron (ZOFRAN) IV, ondansetron, oxyCODONE, sodium chloride  Antibiotics    Anti-infectives   Start     Dose/Rate Route Frequency Ordered Stop   08/27/12 1200  meropenem (MERREM) 1 g in sodium chloride 0.9 % 100 mL IVPB     1 g 200 mL/hr over 30 Minutes Intravenous Every 12 hours 08/27/12 1133     08/26/12 1800  vancomycin (VANCOCIN) 1,500 mg in sodium chloride 0.9 % 500 mL IVPB     1,500 mg 250 mL/hr over 120 Minutes Intravenous every 72 hours 08/25/12 1900     08/21/12  1800  vancomycin (VANCOCIN) 1,500 mg in sodium chloride 0.9 % 500 mL IVPB  Status:  Discontinued     1,500 mg 250 mL/hr over 120 Minutes Intravenous Every 48 hours 08/19/12 1933 08/25/12 1753   08/20/12 0600  piperacillin-tazobactam (ZOSYN) IVPB 3.375 g  Status:  Discontinued     3.375 g 12.5 mL/hr over 240 Minutes Intravenous 3 times per day 08/20/12 0106 08/27/12 1026   08/20/12 0500  ciprofloxacin (CIPRO) IVPB 400 mg  Status:  Discontinued     400 mg 200 mL/hr over 60 Minutes Intravenous Every 24 hours 08/19/12 2350 08/20/12 0105   08/19/12 1700  piperacillin-tazobactam (ZOSYN) IVPB 3.375 g  Status:  Discontinued     3.375 g 12.5 mL/hr over 240 Minutes Intravenous  Once 08/19/12 1646 08/19/12 1647   08/19/12 1700  piperacillin-tazobactam (ZOSYN) IVPB 3.375 g     3.375 g 100 mL/hr over 30 Minutes Intravenous  Once 08/19/12 1648 08/19/12 2024   08/19/12 1700  vancomycin (VANCOCIN) 2,500 mg in sodium chloride 0.9 % 500 mL IVPB     2,500 mg 250 mL/hr over 120 Minutes Intravenous To Emergency Dept 08/19/12 1650 08/19/12 1949       Time Spent in minutes   35   SINGH,PRASHANT K M.D on 08/30/2012 at 7:35 AM  Between 7am to 7pm - Pager - [phone]  After 7pm go to [email or web address] - password [email or web address]  And look for the night coverage person covering for me after hours  [facility] Office  [phone]    Subjective:   Chad [name] today has, No headache, No chest pain, No abdominal pain - No Nausea, No new weakness tingling or numbness, No Cough - SOB.    Objective:   Filed Vitals:   08/29/12 0514 08/29/12 1424 08/29/12 2145 08/30/12 0500  BP: 156/87 150/93 137/81 142/82  Pulse: 79 77 81 84  Temp: 98.4 F (36.9 C) 97.9 F (36.6 C) 98.2 F (36.8 C) 98.1 F (36.7 C)  TempSrc:  Oral    Resp: 19 20 18 18   Height:      Weight:    126.2 kg (278 lb 3.5 oz)  SpO2: 92% 97% 94% 92%    Wt Readings from Last 3 Encounters:  08/30/12 126.2 kg (278 lb 3.5 oz)  08/30/12  126.2 kg (278 lb 3.5 oz)  08/05/12 129.7 kg (285 lb 15 oz)  Intake/Output Summary (Last 24 hours) at 08/30/12 0735 Last data filed at 08/30/12 0500  Gross per 24 hour  Intake   1440 ml  Output   1075 ml  Net    365 ml    Exam Awake Alert, Oriented X 3, No new F.N deficits, Normal affect [place].AT,PERRAL Supple Neck,No JVD, No cervical lymphadenopathy appriciated.  Symmetrical Chest wall movement, Good air movement bilaterally, CTAB RRR,No Gallops,Rubs or new Murmurs, No Parasternal Heave +ve B.Sounds, Abd Soft, Non tender, No organomegaly appriciated, No rebound - guarding or rigidity. No Cyanosis, Clubbing or edema, No new Rash or bruise , left leg in bandage and splint  Data Review   Micro Results Recent Results (from the past 240 hour(s))  WOUND CULTURE     Status: None   Collection Time    08/20/12 10:41 AM      Result Value Range Status   Specimen Description WOUND LEG LEFT   Final   Special Requests Immunocompromised   Final   Gram Stain     Final   Value: NO WBC SEEN     NO SQUAMOUS EPITHELIAL CELLS SEEN     RARE GRAM NEGATIVE RODS   Culture     Final   Value: ABUNDANT PROTEUS MIRABILIS     MODERATE ENTEROCOCCUS SPECIES   Report Status 08/24/2012 FINAL   Final   Organism ID, Bacteria PROTEUS MIRABILIS   Final   Organism ID, Bacteria ENTEROCOCCUS SPECIES   Final  SURGICAL PCR SCREEN     Status: None   Collection Time    08/23/12 11:01 PM      Result Value Range Status   MRSA, PCR NEGATIVE  NEGATIVE Final   Staphylococcus aureus NEGATIVE  NEGATIVE Final   Comment:            The Xpert SA Assay (FDA     approved for NASAL specimens     in patients over 84 years of age),     is one component of     a comprehensive surveillance     program.  Test performance has     been validated by [employer] for patients greater     than or equal to 99 year old.     It is not intended     to diagnose infection nor to     guide or monitor treatment.  ANAEROBIC  CULTURE     Status: None   Collection Time    08/24/12  4:08 PM      Result Value Range Status   Specimen Description ABSCESS LEFT KNEE   Final   Special Requests PATIENT ON FOLLOWING VANCOMYCIN ZOSYN   Final   Gram Stain     Final   Value: NO WBC SEEN     NO SQUAMOUS EPITHELIAL CELLS SEEN     NO ORGANISMS SEEN   Culture NO ANAEROBES ISOLATED   Final   Report Status 08/29/2012 FINAL   Final  CULTURE, ROUTINE-ABSCESS     Status: None   Collection Time    08/24/12  4:08 PM      Result Value Range Status   Specimen Description ABSCESS LEFT KNEE   Final   Special Requests PATIENT ON FOLLOWING VANCOMYCIN ZOSYN   Final   Gram Stain     Final   Value: NO WBC SEEN     NO SQUAMOUS EPITHELIAL CELLS SEEN     NO ORGANISMS SEEN   Culture NO GROWTH  3 DAYS   Final   Report Status 08/27/2012 FINAL   Final  TISSUE CULTURE     Status: None   Collection Time    08/24/12  4:08 PM      Result Value Range Status   Specimen Description TISSUE LEFT KNEE   Final   Special Requests PATIENT ON FOLLOWING VANCOMYCIN ZOSYN   Final   Gram Stain     Final   Value: NO WBC SEEN     NO SQUAMOUS EPITHELIAL CELLS SEEN     NO ORGANISMS SEEN   Culture NO GROWTH 3 DAYS   Final   Report Status 08/27/2012 FINAL   Final  CLOSTRIDIUM DIFFICILE BY PCR     Status: None   Collection Time    08/26/12  9:53 AM      Result Value Range Status   C difficile by pcr NEGATIVE  NEGATIVE Final    Radiology Reports Dg Chest 2 View  07/29/2012   *RADIOLOGY REPORT*  Clinical Data: Shortness of breath, postop left leg surgery  CHEST - 2 VIEW  Comparison: Portable chest x-ray of 07/11/2012  Findings: The lungs are not optimally aerated with some elevation of the right hemidiaphragm and mild right basilar atelectasis. Moderate cardiomegaly is stable.  Resolution is limited due to body habitus.  IMPRESSION: Suboptimal aeration with elevation of the right hemidiaphragm. Stable moderate cardiomegaly.   Original Report Authenticated By:  Dwyane Dee, M.D.   Dg Femur Left  08/20/2012   *RADIOLOGY REPORT*  Clinical Data: Swelling, redness, and pain starting around the left ankle and extending  up the left leg.  Recent fracture and surgery.  LEFT FEMUR - 2 VIEW  Comparison: 07/16/2012  Findings: Postoperative changes with lateral plate and screw fixation of a comminuted fracture of the distal left femoral metaphysis.  Fracture fragments appears stable in position since previous study.  Hardware components appear intact without change in position.  There is some loss of distinction of the fracture line and periosteal reaction suggesting healing.  No focal bone lesion or bone destruction is appreciated.  No cortical destruction or erosion. Soft tissue swelling distal to the patella may represent hematoma or edema.  Small left knee effusion.  No radiopaque soft tissue foreign body or soft tissue gas collections are demonstrated.  Vascular calcifications.  Left hip arthroplasty.  IMPRESSION: No specific evidence of osteomyelitis.  Plate and screw fixation of a healing comminuted transverse fracture of the distal left femoral metaphysis.   Original Report Authenticated By: Burman Nieves, M.D.   Dg Tibia/fibula Left  08/20/2012   *RADIOLOGY REPORT*  Clinical Data: Swelling, redness, and pain starting around the left ankle and extending proximally of the left leg.  Recent fracture and surgery to the distal femur 07/10/2012.  LEFT TIBIA AND FIBULA - 2 VIEW  Comparison: Left femur 07/16/2012  Findings: AP views of the left tibia and fibula are obtained. There is focal periosteal reaction along the distal aspect of the left tibia and less promptly along the metaphyseal region of the distal tibia.  These changes could be due to healing nondisplaced fractures or stress fractures.  Osteomyelitis less likely.  There is no evidence of any cortical destruction or bone lucency.  No radiopaque soft tissue foreign bodies or soft tissue gas collections.   IMPRESSION: Periosteal reaction is demonstrated along the distal left tibia and fibula may represent healing fractures or stress fractures.  No specific evidence of osteomyelitis.  No soft tissue gas collections.   Original  Report Authenticated By: Burman Nieves, M.D.    CBC  Recent Labs Lab 08/26/12 0505  WBC 9.5  HGB 10.4*  HCT 35.6*  PLT 128*  MCV 91.5  MCH 26.7  MCHC 29.2*  RDW 17.9*    Chemistries   Recent Labs Lab 08/26/12 0505 08/27/12 0530 08/28/12 0607 08/29/12 0510 08/30/12 0500  NA 141 143 140 138 140  K 3.4* 3.6 3.7 3.8 4.3  CL 102 104 101 103 104  CO2 29 28 23 26 25   GLUCOSE 96 84 100* 105* 85  BUN 43* 40* 39* 40* 39*  CREATININE 3.68* 3.80* 3.65* 3.54* 3.44*  CALCIUM 8.5 8.6 8.4 8.8 8.7   ------------------------------------------------------------------------------------------------------------------ estimated creatinine clearance is 33.3 ml/min (by C-G formula based on Cr of 3.44). ------------------------------------------------------------------------------------------------------------------ No results found for this basename: HGBA1C,  in the last 72 hours ------------------------------------------------------------------------------------------------------------------ No results found for this basename: CHOL, HDL, LDLCALC, TRIG, CHOLHDL, LDLDIRECT,  in the last 72 hours ------------------------------------------------------------------------------------------------------------------ No results found for this basename: TSH, T4TOTAL, FREET3, T3FREE, THYROIDAB,  in the last 72 hours ------------------------------------------------------------------------------------------------------------------ No results found for this basename: VITAMINB12, FOLATE, FERRITIN, TIBC, IRON, RETICCTPCT,  in the last 72 hours  Coagulation profile No results found for this basename: INR, PROTIME,  in the last 168 hours  No results found for this basename: DDIMER,  in  the last 72 hours  Cardiac Enzymes No results found for this basename: CK, CKMB, TROPONINI, MYOGLOBIN,  in the last 168 hours ------------------------------------------------------------------------------------------------------------------ No components found with this basename: POCBNP,

## 2012-08-30 NOTE — Progress Notes (Signed)
ANTIBIOTIC CONSULT NOTE - FOLLOW UP  Pharmacy Consult for Meropenem and Vancomycin Indication: L knee abscess  No Known Allergies  Patient Measurements: Height: 6' 0.83" (185 cm) Weight: 278 lb 3.5 oz (126.2 kg) IBW/kg (Calculated) : 79.52 Adjusted Body Weight:    Vital Signs: Temp: 98.1 F (36.7 C) (08/04 0500) BP: 142/82 mmHg (08/04 0500) Pulse Rate: 84 (08/04 0500) Intake/Output from previous day: 08/03 0701 - 08/04 0700 In: 1440 [P.O.:1440] Out: 1075 [Urine:1075] Intake/Output from this shift: Total I/O In: -  Out: 251 [Urine:250; Stool:1]  Labs:  Recent Labs  08/28/12 0607 08/29/12 0510 08/30/12 0500  CREATININE 3.65* 3.54* 3.44*   Estimated Creatinine Clearance: 33.3 ml/min (by C-G formula based on Cr of 3.44). No results found for this basename: VANCOTROUGH, Leodis Binet, VANCORANDOM, GENTTROUGH, GENTPEAK, GENTRANDOM, TOBRATROUGH, TOBRAPEAK, TOBRARND, AMIKACINPEAK, AMIKACINTROU, AMIKACIN,  in the last 72 hours   Microbiology: Recent Results (from the past 720 hour(s))  CULTURE, BLOOD (ROUTINE X 2)     Status: None   Collection Time    08/19/12  5:15 PM      Result Value Range Status   Specimen Description BLOOD ARM RIGHT   Final   Special Requests BOTTLES DRAWN AEROBIC ONLY [facility]   Final   Culture  Setup Time 08/19/2012 22:47   Final   Culture NO GROWTH 5 DAYS   Final   Report Status 08/25/2012 FINAL   Final  CULTURE, BLOOD (SINGLE)     Status: None   Collection Time    08/19/12  7:35 PM      Result Value Range Status   Specimen Description BLOOD ARM LEFT   Final   Special Requests BOTTLES DRAWN AEROBIC AND ANAEROBIC 10CC   Final   Culture  Setup Time 08/20/2012 03:45   Final   Culture NO GROWTH 5 DAYS   Final   Report Status 08/26/2012 FINAL   Final  WOUND CULTURE     Status: None   Collection Time    08/20/12 10:41 AM      Result Value Range Status   Specimen Description WOUND LEG LEFT   Final   Special Requests Immunocompromised   Final   Gram  Stain     Final   Value: NO WBC SEEN     NO SQUAMOUS EPITHELIAL CELLS SEEN     RARE GRAM NEGATIVE RODS   Culture     Final   Value: ABUNDANT PROTEUS MIRABILIS     MODERATE ENTEROCOCCUS SPECIES   Report Status 08/24/2012 FINAL   Final   Organism ID, Bacteria PROTEUS MIRABILIS   Final   Organism ID, Bacteria ENTEROCOCCUS SPECIES   Final  SURGICAL PCR SCREEN     Status: None   Collection Time    08/23/12 11:01 PM      Result Value Range Status   MRSA, PCR NEGATIVE  NEGATIVE Final   Staphylococcus aureus NEGATIVE  NEGATIVE Final   Comment:            The Xpert SA Assay (FDA     approved for NASAL specimens     in patients over 36 years of age),     is one component of     a comprehensive surveillance     program.  Test performance has     been validated by [employer] for patients greater     than or equal to 29 year old.     It is not intended  to diagnose infection nor to     guide or monitor treatment.  ANAEROBIC CULTURE     Status: None   Collection Time    08/24/12  4:08 PM      Result Value Range Status   Specimen Description ABSCESS LEFT KNEE   Final   Special Requests PATIENT ON FOLLOWING VANCOMYCIN ZOSYN   Final   Gram Stain     Final   Value: NO WBC SEEN     NO SQUAMOUS EPITHELIAL CELLS SEEN     NO ORGANISMS SEEN   Culture NO ANAEROBES ISOLATED   Final   Report Status 08/29/2012 FINAL   Final  CULTURE, ROUTINE-ABSCESS     Status: None   Collection Time    08/24/12  4:08 PM      Result Value Range Status   Specimen Description ABSCESS LEFT KNEE   Final   Special Requests PATIENT ON FOLLOWING VANCOMYCIN ZOSYN   Final   Gram Stain     Final   Value: NO WBC SEEN     NO SQUAMOUS EPITHELIAL CELLS SEEN     NO ORGANISMS SEEN   Culture NO GROWTH 3 DAYS   Final   Report Status 08/27/2012 FINAL   Final  TISSUE CULTURE     Status: None   Collection Time    08/24/12  4:08 PM      Result Value Range Status   Specimen Description TISSUE LEFT KNEE   Final    Special Requests PATIENT ON FOLLOWING VANCOMYCIN ZOSYN   Final   Gram Stain     Final   Value: NO WBC SEEN     NO SQUAMOUS EPITHELIAL CELLS SEEN     NO ORGANISMS SEEN   Culture NO GROWTH 3 DAYS   Final   Report Status 08/27/2012 FINAL   Final  CLOSTRIDIUM DIFFICILE BY PCR     Status: None   Collection Time    08/26/12  9:53 AM      Result Value Range Status   C difficile by pcr NEGATIVE  NEGATIVE Final     Assessment:  57 y/o M with LLE cellulitis, hx recent ORIF 6/14, no osteo, 7/29 to OR for left leg I&D and wound vac placement.  Patient was initially on vanc/zosyn. Zosyn changed to meropenem 8/1 and vanco kept based on the concern with possible MRSA or coag neg staph.  Based on the possible deep infection that extend to hardware, meropenem was started at a higher dose. ID recs IV abx x 6 weeks to end Sept 9th, then PO abx until hardware removed Today creat 3.44.   Meropenem 8/1>> Vanc 7/24 >> Vanco trough 7/30= 25.8 Zosyn 7/24 >> 8/1  7/25 wound cx - abundant proteus mirabilis sens to all, and moderate enteroccus - sens to amp and vanc 7/24 blood cx (single) - Negative 7/29 Tissue left knee cx: NG x 3 d 7/29 Abscess left knee>> NG x 3 d   Goal of Therapy:  Vancomycin trough 15-20 per ID note 8/1  Plan:  1.Meropenem 1 gm IV q12hrs 2. Continue Vancomycin to 1500mg  IV q72hrs, next dose due Wed 8/6 at 1800. 3. rec to check vancomycin trough prior to 4th dose on this regimen 0 Wed 8/6 at [address] Pharm.D. [phone] 08/30/2012 1:50 PM

## 2012-09-02 LAB — HCV RNA QUANT: HCV Quantitative Log: 6.73 {Log} — ABNORMAL HIGH (ref <1.18)

## 2012-09-14 ENCOUNTER — Telehealth: Payer: Self-pay | Admitting: Licensed Clinical Social Worker

## 2012-09-14 NOTE — Telephone Encounter (Signed)
Patient has an appointment on 8/21 with Dr. Orvan Falconer but he can only come Mondays or Tuesdays. The next available appointment is 9/2 on a Tuesday can he wait that long? Patient states he is doing fine, RN comes daily for wound vac care and labs every Monday. Please advise

## 2012-09-16 ENCOUNTER — Inpatient Hospital Stay: Payer: Self-pay | Admitting: Internal Medicine

## 2012-09-17 NOTE — Telephone Encounter (Signed)
That is fine 

## 2012-09-28 ENCOUNTER — Ambulatory Visit (INDEPENDENT_AMBULATORY_CARE_PROVIDER_SITE_OTHER): Payer: Self-pay | Admitting: Internal Medicine

## 2012-09-28 ENCOUNTER — Encounter: Payer: Self-pay | Admitting: Internal Medicine

## 2012-09-28 VITALS — BP 143/86 | HR 69 | Temp 98.6°F

## 2012-09-28 DIAGNOSIS — Z5189 Encounter for other specified aftercare: Secondary | ICD-10-CM

## 2012-09-28 MED ORDER — VANCOMYCIN HCL 10 G IV SOLR: 1500.0000 mg | INTRAVENOUS | Status: AC

## 2012-09-28 MED ORDER — AMOXICILLIN-POT CLAVULANATE 500-125 MG PO TABS: 1.0000 | ORAL_TABLET | Freq: Two times a day (BID) | ORAL | Status: DC

## 2012-09-28 MED ORDER — SODIUM CHLORIDE 0.9 % IV SOLN: 1.0000 g | Freq: Two times a day (BID) | INTRAVENOUS | Status: AC

## 2012-09-28 NOTE — Progress Notes (Signed)
Patient ID: Chad [name], male   DOB: [birth year], 56 y.o.   MRN: [id number]         [facility]  Patient Active Problem List   Diagnosis Date Noted  . Postoperative wound infection 08/25/2012    Priority: High  . Diarrhea 03/16/2012    Priority: Medium  . Femur fracture, left 07/16/2012  . Fracture, femur, distal 07/10/2012  . Nausea and vomiting 03/13/2012  . Hx of amiodarone therapy 03/13/2012  . Steroid long-term use 03/13/2012  . Pulmonary hypertension 03/13/2012  . CHF (congestive heart failure) 12/08/2011  . Community acquired pneumonia 12/08/2011  . PAF (paroxysmal atrial fibrillation) 12/08/2011  . History of renal transplant 12/08/2011  . HTN (hypertension) 12/08/2011  . Respiratory failure with hypoxia 12/08/2011  . CKD (chronic kidney disease) 11/18/2011  . Hypertension 11/18/2011  . OSA (obstructive sleep apnea) 11/13/2011  . Chronic systolic CHF (congestive heart failure) 10/22/2011  . Acute on chronic systolic heart failure 10/03/2011  . Atrial flutter 08/27/2011  . Ulcer of left lower leg 08/27/2011  . Conjunctivitis, acute, left eye 08/27/2011  . 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 Septic shock 06/10/2011  . Acute renal failure 06/10/2011  . Cardiomyopathy 06/10/2011  . Acute systolic heart failure 06/09/2011  . Renal transplant disorder 06/05/2011  . HCAP (healthcare-associated pneumonia) 06/04/2011  . Thrombocytopenia 06/04/2011  . Acute respiratory failure 06/04/2011  . ARDS (adult respiratory distress syndrome) 06/04/2011    Patient's Medications  New Prescriptions   AMOXICILLIN-CLAVULANATE (AUGMENTIN) 500-125 MG PER TABLET    Take 1 tablet (500 mg total) by mouth 2 (two) times daily.  Previous Medications   ALLOPURINOL (ZYLOPRIM) 100 MG TABLET    Take 1 tablet (100 mg total) by mouth 2 (two) times daily.   AMIODARONE (PACERONE) 200 MG TABLET    Take 1 tablet (200 mg total) by mouth daily.   CARVEDILOL (COREG) 25  MG TABLET    Take 1 tablet (25 mg total) by mouth 2 (two) times daily with a meal.   FUROSEMIDE (LASIX) 20 MG TABLET    Take 5 tablets (100 mg total) by mouth 2 (two) times daily.   HYDRALAZINE (APRESOLINE) 50 MG TABLET    Take 1 tablet (50 mg total) by mouth 3 (three) times daily.   ISOSORBIDE MONONITRATE (IMDUR) 60 MG 24 HR TABLET    Take 1 tablet (60 mg total) by mouth daily.   METHOCARBAMOL (ROBAXIN) 500 MG TABLET    Take 500 mg by mouth every 6 (six) hours as needed (For muscle pain).   OMEPRAZOLE (PRILOSEC) 20 MG CAPSULE    Take 1 capsule (20 mg total) by mouth daily.   OXYCODONE (OXY IR/ROXICODONE) 5 MG IMMEDIATE RELEASE TABLET    Take 5-10 mg by mouth every 3 (three) hours as needed for pain.   OXYCODONE (OXY IR/ROXICODONE) 5 MG IMMEDIATE RELEASE TABLET    Take 1-2 tablets (5-10 mg total) by mouth every 3 (three) hours as needed for pain.   POTASSIUM CHLORIDE SA (K-DUR,KLOR-CON) 20 MEQ TABLET    Take 1 tablet (20 mEq total) by mouth daily.   PRAVASTATIN (PRAVACHOL) 40 MG TABLET    Take 1 tablet (40 mg total) by mouth daily.   PREDNISONE (DELTASONE) 10 MG TABLET    Take 1 tablet (10 mg total) by mouth daily.   SIROLIMUS (RAPAMUNE) 1 MG/ML SOLUTION    Take 1.2 mg by mouth daily.  Modified Medications   Modified Medication Previous Medication   SODIUM  CHLORIDE 0.9 % SOLN 100 ML WITH MEROPENEM 1 G SOLR 1 G sodium chloride 0.9 % SOLN 100 mL with meropenem 1 G SOLR 1 g      Inject 1 g into the vein every 12 (twelve) hours.    Inject 1 g into the vein every 12 (twelve) hours.   SODIUM CHLORIDE 0.9 % SOLN 500 ML WITH VANCOMYCIN 10 G SOLR 1,500 MG sodium chloride 0.9 % SOLN 500 mL with vancomycin 10 G SOLR 1,500 mg      Inject 1,500 mg into the vein every 3 (three) days.    Inject 1,500 mg into the vein every 3 (three) days.  Discontinued Medications   No medications on file    Subjective: [name] is in for his hospital followup visit. He fell and sustained a distal left femur fracture on  June 14. He underwent open reduction and internal fixation with a plate and had a prolonged hospitalization for inpatient rehabilitation. He was discharged on July 11 with an Unaboot. The Unaboot was removed recently and he started to have increase swelling and erythema around the low. He has had some persistent thin drainage from one area of the wound. Swab cultures of wound drainage on admission have grown a very sensitive Proteus and enterococcus but may not represent true pathogens. He was started on empiric vancomycin and piperacillin tazobactam and had I and D yesterday. A large seroma was encountered that tracked down to the plate. There is also an area that looked like old hematoma lateral to the knee but not involving the joint. Operative Gram stain showed no organisms and cultures showed no growth. He was discharged on IV vancomycin and meropenem. He has had no problems tolerating his central line or IV antibiotics. He is feeling better and states that his open wound is about half the size that it was when he left hospital. He is still having the back wound a dressing change every 3 days. X-rays done while he was hospitalized showed no evidence of osteomyelitis at the distal femur fracture site at the fracture site was not completely healed yet.  Review of Systems: Pertinent items are noted in HPI.  Past Medical History  Diagnosis Date  . Hypertension   . Hepatitis C 1996  . CKD (chronic kidney disease), stage IV     a. s/p renal transplant 1996.  Marland Kitchen Pneumonia   . HLD (hyperlipidemia)   . Nonischemic cardiomyopathy     a. unknown etiology, EF 30-35% by echo 09/2011;  b. 09/2011 Lexi MV EF 42%, no ischemia/infarct.  . Systolic CHF   . Atrial flutter     a. in the setting of sepsis 05/2011; on amio; no anticoagulation    History  Substance Use Topics  . Smoking status: Never Smoker   . Smokeless tobacco: Never Used  . Alcohol Use: No    Family History  Problem Relation Age of Onset    . Heart disease Neg Hx     No Known Allergies  Objective: Temp: 98.6 F (37 C) (09/02 1117) Temp src: Oral (09/02 1117) BP: 143/86 mmHg (09/02 1117) Pulse Rate: 69 (09/02 1117)  General: He is in good spirits seated in his wheelchair Skin: His right anterior chest central line site appears normal He has a VAC dressing and Ace wrap on his left leg   Assessment: He appears to be healing on broad empiric therapy for probable postoperative wound abscess that tracks down to the fracture site and hardware. He  is now completed 35 days of therapy. I will treat him one more week with his IV antibiotics and then switched to oral Augmentin.  Plan: 1. Continue IV vancomycin and meropenem one more week then discontinue and remove central line 2. Start Augmentin 500/125 twice daily in one week 3. Followup in one month   Cliffton Asters, MD [facility] Infect[facility] [phone] pager   [phone] cell 09/28/2012, 11:59 AM

## 2012-09-30 ENCOUNTER — Emergency Department (HOSPITAL_COMMUNITY)
Admission: EM | Admit: 2012-09-30 | Discharge: 2012-09-30 | Disposition: A | Discharge status: Home / Self Care | Payer: Medicaid Other | Attending: Emergency Medicine | Admitting: Emergency Medicine

## 2012-09-30 ENCOUNTER — Encounter (HOSPITAL_COMMUNITY): Payer: Self-pay | Admitting: Emergency Medicine

## 2012-09-30 ENCOUNTER — Telehealth: Payer: Self-pay | Admitting: *Deleted

## 2012-09-30 DIAGNOSIS — N189 Chronic kidney disease, unspecified: Secondary | ICD-10-CM

## 2012-09-30 DIAGNOSIS — I4892 Unspecified atrial flutter: Secondary | ICD-10-CM | POA: Insufficient documentation

## 2012-09-30 DIAGNOSIS — Z8701 Personal history of pneumonia (recurrent): Secondary | ICD-10-CM | POA: Insufficient documentation

## 2012-09-30 DIAGNOSIS — IMO0002 Reserved for concepts with insufficient information to code with codable children: Secondary | ICD-10-CM | POA: Insufficient documentation

## 2012-09-30 DIAGNOSIS — E785 Hyperlipidemia, unspecified: Secondary | ICD-10-CM | POA: Insufficient documentation

## 2012-09-30 DIAGNOSIS — Z992 Dependence on renal dialysis: Secondary | ICD-10-CM | POA: Insufficient documentation

## 2012-09-30 DIAGNOSIS — I120 Hypertensive chronic kidney disease with stage 5 chronic kidney disease or end stage renal disease: Secondary | ICD-10-CM | POA: Insufficient documentation

## 2012-09-30 DIAGNOSIS — Z8619 Personal history of other infectious and parasitic diseases: Secondary | ICD-10-CM | POA: Insufficient documentation

## 2012-09-30 DIAGNOSIS — I5020 Unspecified systolic (congestive) heart failure: Secondary | ICD-10-CM | POA: Insufficient documentation

## 2012-09-30 DIAGNOSIS — N186 End stage renal disease: Secondary | ICD-10-CM | POA: Insufficient documentation

## 2012-09-30 DIAGNOSIS — Z792 Long term (current) use of antibiotics: Secondary | ICD-10-CM | POA: Insufficient documentation

## 2012-09-30 DIAGNOSIS — Z79899 Other long term (current) drug therapy: Secondary | ICD-10-CM | POA: Insufficient documentation

## 2012-09-30 LAB — CBC WITH DIFFERENTIAL/PLATELET
Eosinophils Relative: 1 % (ref 0–5)
HCT: 37 % — ABNORMAL LOW (ref 39.0–52.0)
Hemoglobin: 12.1 g/dL — ABNORMAL LOW (ref 13.0–17.0)
Lymphocytes Relative: 6 % — ABNORMAL LOW (ref 12–46)
Lymphs Abs: 0.6 10*3/uL — ABNORMAL LOW (ref 0.7–4.0)
MCV: 86 fL (ref 78.0–100.0)
Monocytes Relative: 10 % (ref 3–12)
Platelets: 121 10*3/uL — ABNORMAL LOW (ref 150–400)
RBC: 4.3 MIL/uL (ref 4.22–5.81)
WBC: 11.4 10*3/uL — ABNORMAL HIGH (ref 4.0–10.5)

## 2012-09-30 LAB — URINALYSIS, ROUTINE W REFLEX MICROSCOPIC
Glucose, UA: NEGATIVE mg/dL
Ketones, ur: NEGATIVE mg/dL
Leukocytes, UA: NEGATIVE
pH: 5.5 (ref 5.0–8.0)

## 2012-09-30 LAB — BASIC METABOLIC PANEL
CO2: 24 mEq/L (ref 19–32)
Calcium: 8.6 mg/dL (ref 8.4–10.5)
Chloride: 101 mEq/L (ref 96–112)
Potassium: 4.5 mEq/L (ref 3.5–5.1)
Sodium: 138 mEq/L (ref 135–145)

## 2012-09-30 LAB — URINE MICROSCOPIC-ADD ON

## 2012-09-30 NOTE — ED Notes (Signed)
[facility] arrived to transfer patient home  

## 2012-09-30 NOTE — ED Notes (Signed)
Pt reports he was told to come to the hospital for elevated potassium on yesterdays labs. Pt alert, oriented, NAD at present.

## 2012-09-30 NOTE — Telephone Encounter (Signed)
Becky, RN at [facility] reported a critical high of potassium > 7.5 without evidence of hemolysis.  Dr. Orvan Falconer notified, recommended pt go to the ED for EKG, evaluation and treatment. Pt hesitant about going to the ED; RN encouraged him to have this evaluated there today especially given his cardiac and renal health history.   Pt will contact his father and son to see if they can take him to the ED.  If they are not available, he states he will call for an ambulance.  Pt instructed to give the EMS or ED his lab results on admission. Andree Coss, RN

## 2012-09-30 NOTE — ED Provider Notes (Signed)
CSN: [id number]     Arrival date & time 09/30/12  1820 History   First MD Initiated Contact with Patient 09/30/12 1822     Chief Complaint  Patient presents with  . Follow-up   (Consider location/radiation/quality/duration/timing/severity/associated sxs/prior Treatment) HPI Pt with history of chronic kidney disease also recently had femur fx ORIF with hardware infection and subsequent washout. Now on Vanc and Meropenem via home infusion with wound vac. He was seen in ID clinic 2 days ago, doing well, plan to switch to oral Abx in a week however yesterday [facility] came to draw some labs and he was called today and told his K was >7.5. Advised to come to the ED. He is asymptomatic, feeling well, denies weakness, CP, SOB, palpitations. Urinating normally.   Past Medical History  Diagnosis Date  . Hypertension   . Hepatitis C 1996  . CKD (chronic kidney disease), stage IV     a. s/p renal transplant 1996.  Marland Kitchen Pneumonia   . HLD (hyperlipidemia)   . Nonischemic cardiomyopathy     a. unknown etiology, EF 30-35% by echo 09/2011;  b. 09/2011 Lexi MV EF 42%, no ischemia/infarct.  . Systolic CHF   . Atrial flutter     a. in the setting of sepsis 05/2011; on amio; no anticoagulation   Past Surgical History  Procedure Laterality Date  . Nephrectomy transplanted organ    . Insertion of dialysis catheter  06/20/2011    Procedure: INSERTION OF DIALYSIS CATHETER;  Surgeon: [name], MD;  Location: [facility] OR;  Service: Vascular;  Laterality: Right;  Ultrasound guided insertion of right internal jugular dialysis catheter  . Multiple extractions with alveoloplasty  06/27/2011    Procedure: MULTIPLE EXTRACION WITH ALVEOLOPLASTY;  Surgeon: [name], DDS;  Location: [facility] OR;  Service: Oral Surgery;  Laterality: N/A;  Extraction  of tooth # 14 with alveoloplasty  . Hip surgery    . Joint replacement      left hip  . Femur fracture surgery Left 07/09/2012    Dr Deno Etienne  . Femur im nail Left 07/10/2012   Procedure: IM FEMORAL NAIL;  Surgeon: Toni Arthurs, MD;  Location: [facility] OR;  Service: Orthopedics;  Laterality: Left;  . I&d extremity Left 08/24/2012    Procedure: LEFT LEG IRRIGATION AND DEBRIDEMENT WITH POSSIBLE WOUND VAC APPLICATION;  Surgeon: Toni Arthurs, MD;  Location: [facility] OR;  Service: Orthopedics;  Laterality: Left;   Family History  Problem Relation Age of Onset  . Heart disease Neg Hx    History  Substance Use Topics  . Smoking status: Never Smoker   . Smokeless tobacco: Never Used  . Alcohol Use: No    Review of Systems All other systems reviewed and are negative except as noted in HPI.   Allergies  Review of patient's allergies indicates no known allergies.  Home Medications   Current Outpatient Rx  Name  Route  Sig  Dispense  Refill  . allopurinol (ZYLOPRIM) 100 MG tablet   Oral   Take 1 tablet (100 mg total) by mouth 2 (two) times daily.   60 tablet   1   . amiodarone (PACERONE) 200 MG tablet   Oral   Take 1 tablet (200 mg total) by mouth daily.   30 tablet   1   . amoxicillin-clavulanate (AUGMENTIN) 500-125 MG per tablet   Oral   Take 1 tablet (500 mg total) by mouth 2 (two) times daily.   60 tablet   1   .  carvedilol (COREG) 25 MG tablet   Oral   Take 1 tablet (25 mg total) by mouth 2 (two) times daily with a meal.   60 tablet   1   . furosemide (LASIX) 20 MG tablet   Oral   Take 5 tablets (100 mg total) by mouth 2 (two) times daily.   60 tablet   1   . hydrALAZINE (APRESOLINE) 50 MG tablet   Oral   Take 1 tablet (50 mg total) by mouth 3 (three) times daily.   90 tablet   1   . isosorbide mononitrate (IMDUR) 60 MG 24 hr tablet   Oral   Take 1 tablet (60 mg total) by mouth daily.   30 tablet   2   . methocarbamol (ROBAXIN) 500 MG tablet   Oral   Take 500 mg by mouth every 6 (six) hours as needed (For muscle pain).         Marland Kitchen omeprazole (PRILOSEC) 20 MG capsule   Oral   Take 1 capsule (20 mg total) by mouth daily.   30 capsule   1    . oxyCODONE (OXY IR/ROXICODONE) 5 MG immediate release tablet   Oral   Take 5-10 mg by mouth every 3 (three) hours as needed for pain.         Marland Kitchen oxyCODONE (OXY IR/ROXICODONE) 5 MG immediate release tablet   Oral   Take 1-2 tablets (5-10 mg total) by mouth every 3 (three) hours as needed for pain.   40 tablet   0   . potassium chloride SA (K-DUR,KLOR-CON) 20 MEQ tablet   Oral   Take 1 tablet (20 mEq total) by mouth daily.   30 tablet   1   . pravastatin (PRAVACHOL) 40 MG tablet   Oral   Take 1 tablet (40 mg total) by mouth daily.   30 tablet   1   . predniSONE (DELTASONE) 10 MG tablet   Oral   Take 1 tablet (10 mg total) by mouth daily.   30 tablet   1   . sirolimus (RAPAMUNE) 1 MG/ML solution   Oral   Take 1.2 mg by mouth daily.         . sodium chloride 0.9 % SOLN 100 mL with meropenem 1 G SOLR 1 g   Intravenous   Inject 1 g into the vein every 12 (twelve) hours.   70 g   0   . sodium chloride 0.9 % SOLN 500 mL with vancomycin 10 G SOLR 1,500 mg   Intravenous   Inject 1,500 mg into the vein every 3 (three) days.   1800 mg   0    BP 170/95  Pulse 88  Temp(Src) 98.3 F (36.8 C) (Oral)  Resp 28  SpO2 94% Physical Exam  Nursing note and vitals reviewed. Constitutional: He is oriented to person, place, and time. He appears well-developed and well-nourished.  HENT:  Head: Normocephalic and atraumatic.  Eyes: EOM are normal. Pupils are equal, round, and reactive to light.  Neck: Normal range of motion. Neck supple.  Cardiovascular: Normal rate, normal heart sounds and intact distal pulses.   Pulmonary/Chest: Effort normal and breath sounds normal.  Abdominal: Bowel sounds are normal. He exhibits no distension. There is no tenderness.  Musculoskeletal: Normal range of motion. He exhibits edema (bilateral LE edema).  L leg in immobilizer with wound vac in place  Neurological: He is alert and oriented to person, place, and time. He has normal strength.  No  cranial nerve deficit or sensory deficit.  Skin: Skin is warm and dry. No rash noted.  Psychiatric: He has a normal mood and affect.    ED Course  Procedures (including critical care time) Labs Review Labs Reviewed  BASIC METABOLIC PANEL - Abnormal; Notable for the following:    Glucose, Bld 137 (*)    BUN 65 (*)    Creatinine, Ser 2.82 (*)    GFR calc non Af Amer 23 (*)    GFR calc Af Amer 27 (*)    All other components within normal limits  CBC WITH DIFFERENTIAL - Abnormal; Notable for the following:    WBC 11.4 (*)    Hemoglobin 12.1 (*)    HCT 37.0 (*)    RDW 18.0 (*)    Platelets 121 (*)    Neutrophils Relative % 83 (*)    Neutro Abs 9.5 (*)    Lymphocytes Relative 6 (*)    Lymphs Abs 0.6 (*)    Monocytes Absolute 1.1 (*)    All other components within normal limits  URINALYSIS, ROUTINE W REFLEX MICROSCOPIC - Abnormal; Notable for the following:    Protein, ur 100 (*)    All other components within normal limits  URINE MICROSCOPIC-ADD ON   Imaging Review No results found.  MDM   1. Chronic kidney disease      Date: 09/30/2012  Rate: 82  Rhythm: normal sinus rhythm  QRS Axis: normal  Intervals: normal  ST/T Wave abnormalities: normal  Conduction Disutrbances:nonspecific intraventricular conduction delay  Narrative Interpretation: no sequela of acute hyperkalemia noted  Old EKG Reviewed: unchanged  No hyperkalemia here. Pt discharged home.     Charles B. Bernette Mayers, MD 09/30/12 2010

## 2012-09-30 NOTE — ED Notes (Signed)
Phlebotomy at bedside.

## 2012-09-30 NOTE — ED Notes (Signed)
Pt with wound vac to left upper extremity. Bilateral lower extremity dressings, clean, dry and intact. Pt reports dressings changed yesterday by home health RN

## 2012-10-05 ENCOUNTER — Ambulatory Visit (HOSPITAL_COMMUNITY)
Admission: RE | Admit: 2012-10-05 | Discharge: 2012-10-05 | Disposition: A | Discharge status: Home / Self Care | Payer: Medicaid Other | Source: Ambulatory Visit | Attending: Internal Medicine | Admitting: Internal Medicine

## 2012-10-05 ENCOUNTER — Other Ambulatory Visit: Payer: Self-pay | Admitting: Internal Medicine

## 2012-10-05 DIAGNOSIS — Z452 Encounter for adjustment and management of vascular access device: Secondary | ICD-10-CM | POA: Insufficient documentation

## 2012-10-05 MED ORDER — CHLORHEXIDINE GLUCONATE 4 % EX LIQD
CUTANEOUS | Status: AC
  Filled 2012-10-05: qty 30

## 2012-10-05 NOTE — Procedures (Signed)
Successful removal Rt IJ tunneled central venous catheter. No complications.  Brayton El PA-C Interventional Radiology 10/05/2012 11:59 AM

## 2012-10-20 ENCOUNTER — Other Ambulatory Visit: Payer: Self-pay

## 2012-10-20 MED ORDER — PRAVASTATIN SODIUM 40 MG PO TABS: 40.0000 mg | ORAL_TABLET | Freq: Every day | ORAL | Status: DC

## 2012-10-25 ENCOUNTER — Encounter (HOSPITAL_COMMUNITY): Payer: Self-pay

## 2012-10-25 ENCOUNTER — Ambulatory Visit: Payer: Self-pay | Admitting: Surgery

## 2012-10-26 ENCOUNTER — Ambulatory Visit (INDEPENDENT_AMBULATORY_CARE_PROVIDER_SITE_OTHER): Payer: Medicaid Other | Admitting: Internal Medicine

## 2012-10-26 ENCOUNTER — Encounter: Payer: Self-pay | Admitting: Internal Medicine

## 2012-10-26 ENCOUNTER — Encounter: Payer: Self-pay | Admitting: Infectious Disease

## 2012-10-26 VITALS — BP 160/90 | HR 68 | Temp 98.4°F | Ht 72.0 in

## 2012-10-26 DIAGNOSIS — Z23 Encounter for immunization: Secondary | ICD-10-CM

## 2012-10-26 DIAGNOSIS — Z5189 Encounter for other specified aftercare: Secondary | ICD-10-CM

## 2012-10-26 MED ORDER — AMOXICILLIN-POT CLAVULANATE 500-125 MG PO TABS: 1.0000 | ORAL_TABLET | Freq: Two times a day (BID) | ORAL | Status: DC

## 2012-10-26 NOTE — Progress Notes (Signed)
Patient ID: Chad [name], male   DOB: [birth year], 56 y.o.   MRN: [id number]         [facility] for Infectious Disease  Patient Active Problem List   Diagnosis Date Noted  . Postoperative wound infection 08/25/2012    Priority: High  . Diarrhea 03/16/2012    Priority: Medium  . Femur fracture, left 07/16/2012  . Fracture, femur, distal 07/10/2012  . Nausea and vomiting 03/13/2012  . Hx of amiodarone therapy 03/13/2012  . Steroid long-term use 03/13/2012  . Pulmonary hypertension 03/13/2012  . CHF (congestive heart failure) 12/08/2011  . Community acquired pneumonia 12/08/2011  . PAF (paroxysmal atrial fibrillation) 12/08/2011  . History of renal transplant 12/08/2011  . HTN (hypertension) 12/08/2011  . Respiratory failure with hypoxia 12/08/2011  . CKD (chronic kidney disease) 11/18/2011  . Hypertension 11/18/2011  . OSA (obstructive sleep apnea) 11/13/2011  . Chronic systolic CHF (congestive heart failure) 10/22/2011  . Acute on chronic systolic heart failure 10/03/2011  . Atrial flutter 08/27/2011  . Ulcer of left lower leg 08/27/2011  . Conjunctivitis, acute, left eye 08/27/2011  . 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 Septic shock 06/10/2011  . Acute renal failure 06/10/2011  . Cardiomyopathy 06/10/2011  . Acute systolic heart failure 06/09/2011  . Renal transplant disorder 06/05/2011  . HCAP (healthcare-associated pneumonia) 06/04/2011  . Thrombocytopenia 06/04/2011  . Acute respiratory failure 06/04/2011  . ARDS (adult respiratory distress syndrome) 06/04/2011    Patient's Medications  New Prescriptions   No medications on file  Previous Medications   ALLOPURINOL (ZYLOPRIM) 100 MG TABLET    Take 1 tablet (100 mg total) by mouth 2 (two) times daily.   AMIODARONE (PACERONE) 200 MG TABLET    Take 1 tablet (200 mg total) by mouth daily.   CARVEDILOL (COREG) 25 MG TABLET    Take 1 tablet (25 mg total) by mouth 2 (two) times daily with a meal.   FUROSEMIDE  (LASIX) 20 MG TABLET    Take 40 mg by mouth 2 (two) times daily.   HYDRALAZINE (APRESOLINE) 50 MG TABLET    Take 1 tablet (50 mg total) by mouth 3 (three) times daily.   HYDROCODONE-ACETAMINOPHEN (NORCO/VICODIN) 5-325 MG PER TABLET    Take 1 tablet by mouth every 8 (eight) hours as needed for pain.   ISOSORBIDE MONONITRATE (IMDUR) 60 MG 24 HR TABLET    Take 1 tablet (60 mg total) by mouth daily.   METHOCARBAMOL (ROBAXIN) 500 MG TABLET    Take 500 mg by mouth every 6 (six) hours as needed (For muscle pain).   OMEPRAZOLE (PRILOSEC) 20 MG CAPSULE    Take 1 capsule (20 mg total) by mouth daily.   POTASSIUM CHLORIDE SA (K-DUR,KLOR-CON) 20 MEQ TABLET    Take 1 tablet (20 mEq total) by mouth daily.   PRAVASTATIN (PRAVACHOL) 40 MG TABLET    Take 1 tablet (40 mg total) by mouth daily.   PREDNISONE (DELTASONE) 10 MG TABLET    Take 1 tablet (10 mg total) by mouth daily.   SIROLIMUS (RAPAMUNE) 1 MG/ML SOLUTION    Take 1.2 mg by mouth every morning.   Modified Medications   Modified Medication Previous Medication   AMOXICILLIN-CLAVULANATE (AUGMENTIN) 500-125 MG PER TABLET amoxicillin-clavulanate (AUGMENTIN) 500-125 MG per tablet      Take 1 tablet (500 mg total) by mouth 2 (two) times daily.    Take 1 tablet (500 mg total) by mouth 2 (two) times daily.  Discontinued Medications   OXYCODONE (  OXY IR/ROXICODONE) 5 MG IMMEDIATE RELEASE TABLET    Take 1-2 tablets (5-10 mg total) by mouth every 3 (three) hours as needed for pain.    Subjective: Chad [name] is in for his routine followup visit. He is now completed 9 weeks of total antibiotic therapy including 3 weeks of oral Augmentin most recently. He's had no problems tolerating his antibiotic. The wound over his left knee is improving slowly. He has not had any fever, chills, nausea, vomiting or diarrhea. He is making some progress with physical therapy.  Review of Systems: Pertinent items are noted in HPI.  Past Medical History  Diagnosis Date  .  Hypertension   . Hepatitis C 1996  . CKD (chronic kidney disease), stage IV     a. s/p renal transplant 1996.  Marland Kitchen Pneumonia   . HLD (hyperlipidemia)   . Nonischemic cardiomyopathy     a. unknown etiology, EF 30-35% by echo 09/2011;  b. 09/2011 Lexi MV EF 42%, no ischemia/infarct.  . Systolic CHF   . Atrial flutter     a. in the setting of sepsis 05/2011; on amio; no anticoagulation    History  Substance Use Topics  . Smoking status: Never Smoker   . Smokeless tobacco: Never Used  . Alcohol Use: No    Family History  Problem Relation Age of Onset  . Heart disease Neg Hx     No Known Allergies  Objective: Temp: 98.4 F (36.9 C) (09/30 1135) Temp src: Oral (09/30 1135) BP: 160/90 mmHg (09/30 1135) Pulse Rate: 68 (09/30 1135)  General: He is seated in his wheelchair. He is in good spirits Skin: No rash Left leg: He still has a quarter-sized open wound over his left knee with good pink granulation tissue  Lab Results  Component Value Date   CRP 1.8* 08/27/2012   Lab Results  Component Value Date   ESRSEDRATE 75* 08/27/2012      Assessment: He is improving and tolerating oral Augmentin. The plan is to continue this until his distal femur fracture heals sufficiently that the hardware could be removed. Superficial swab cultures from his wound have grown enterococcus and Proteus previously operative cultures at the time of his last debridement were negative but they were obtained while he was on antibiotic therapy.  Plan: 1. Continue Augmentin for now 2. He knows to call me right away if he develops any potential side effects 3. Followup in 6 weeks   Cliffton Asters, MD [facility] for Infectious Disease [facility] [phone] pager   [phone] cell 10/26/2012, 11:49 AM

## 2012-10-27 ENCOUNTER — Encounter: Payer: Self-pay | Admitting: Infectious Disease

## 2012-11-01 ENCOUNTER — Other Ambulatory Visit: Payer: Self-pay | Admitting: Cardiology

## 2012-11-08 ENCOUNTER — Other Ambulatory Visit: Payer: Self-pay | Admitting: Cardiology

## 2012-11-08 ENCOUNTER — Telehealth: Payer: Self-pay | Admitting: Cardiology

## 2012-11-08 NOTE — Telephone Encounter (Signed)
New message   Just left pt--172/102 bp reading------out of bp rx--

## 2012-11-08 NOTE — Telephone Encounter (Signed)
Spoke with Delorse Limber, PT with [facility]. Pt had a fracture of his left leg. She states she has checked his BP in the past and SBP  has been in the 140-150 range. She states today his BP was 172/102. Pt apparently has been out of his medication for several days, she wasn't sure exactly how long. She advised pt to get meds filled and take them ASAP. LMTCB for pt to follow up on BP.

## 2012-11-08 NOTE — Telephone Encounter (Signed)
Patient states he got his medication and has taken it.  He does not have a way to check his BP but feels much better and feels like his BP is better.  It is hard for him to get out of the house right now, the nurse will be back Thursday. I have scheduled an appt for him to see Dr Shirlee Latch 12/20/12--he felt like this was about the first time he would be physically able to come for the appt.

## 2012-11-30 ENCOUNTER — Other Ambulatory Visit: Payer: Self-pay | Admitting: Cardiology

## 2012-11-30 ENCOUNTER — Other Ambulatory Visit: Payer: Self-pay | Admitting: *Deleted

## 2012-11-30 DIAGNOSIS — Z01810 Encounter for preprocedural cardiovascular examination: Secondary | ICD-10-CM

## 2012-11-30 DIAGNOSIS — N184 Chronic kidney disease, stage 4 (severe): Secondary | ICD-10-CM

## 2012-12-01 ENCOUNTER — Other Ambulatory Visit: Payer: Self-pay | Admitting: Cardiology

## 2012-12-03 ENCOUNTER — Encounter: Payer: Self-pay | Admitting: Surgery

## 2012-12-06 ENCOUNTER — Encounter (HOSPITAL_COMMUNITY): Payer: Self-pay

## 2012-12-06 ENCOUNTER — Other Ambulatory Visit (HOSPITAL_COMMUNITY): Payer: Self-pay

## 2012-12-06 ENCOUNTER — Ambulatory Visit: Payer: Self-pay | Admitting: Surgery

## 2012-12-07 ENCOUNTER — Ambulatory Visit: Payer: Self-pay | Admitting: Internal Medicine

## 2012-12-09 ENCOUNTER — Other Ambulatory Visit: Payer: Self-pay | Admitting: Orthopedic Surgery

## 2012-12-09 DIAGNOSIS — S7292XD Unspecified fracture of left femur, subsequent encounter for closed fracture with routine healing: Secondary | ICD-10-CM

## 2012-12-15 ENCOUNTER — Other Ambulatory Visit: Payer: Self-pay

## 2012-12-20 ENCOUNTER — Ambulatory Visit
Admission: RE | Admit: 2012-12-20 | Discharge: 2012-12-20 | Disposition: A | Discharge status: Home / Self Care | Payer: Medicaid Other | Source: Ambulatory Visit | Attending: Orthopedic Surgery | Admitting: Orthopedic Surgery

## 2012-12-20 ENCOUNTER — Ambulatory Visit: Payer: Medicaid Other | Admitting: Cardiology

## 2012-12-20 DIAGNOSIS — S7292XD Unspecified fracture of left femur, subsequent encounter for closed fracture with routine healing: Secondary | ICD-10-CM

## 2012-12-30 ENCOUNTER — Other Ambulatory Visit: Payer: Self-pay | Admitting: Cardiology

## 2013-01-02 ENCOUNTER — Other Ambulatory Visit: Payer: Self-pay | Admitting: Cardiology

## 2013-01-03 ENCOUNTER — Other Ambulatory Visit: Payer: Self-pay | Admitting: Cardiology

## 2013-01-17 ENCOUNTER — Encounter (HOSPITAL_COMMUNITY): Payer: Self-pay | Admitting: Emergency Medicine

## 2013-01-17 ENCOUNTER — Emergency Department (HOSPITAL_COMMUNITY): Payer: Medicaid Other

## 2013-01-17 ENCOUNTER — Emergency Department (HOSPITAL_COMMUNITY)
Admission: EM | Admit: 2013-01-17 | Discharge: 2013-01-17 | Disposition: A | Discharge status: Home / Self Care | Payer: Medicaid Other | Attending: Emergency Medicine | Admitting: Emergency Medicine

## 2013-01-17 DIAGNOSIS — I428 Other cardiomyopathies: Secondary | ICD-10-CM | POA: Insufficient documentation

## 2013-01-17 DIAGNOSIS — N184 Chronic kidney disease, stage 4 (severe): Secondary | ICD-10-CM | POA: Insufficient documentation

## 2013-01-17 DIAGNOSIS — Z8719 Personal history of other diseases of the digestive system: Secondary | ICD-10-CM | POA: Insufficient documentation

## 2013-01-17 DIAGNOSIS — I890 Lymphedema, not elsewhere classified: Secondary | ICD-10-CM | POA: Insufficient documentation

## 2013-01-17 DIAGNOSIS — I83009 Varicose veins of unspecified lower extremity with ulcer of unspecified site: Secondary | ICD-10-CM | POA: Insufficient documentation

## 2013-01-17 DIAGNOSIS — Z7982 Long term (current) use of aspirin: Secondary | ICD-10-CM | POA: Insufficient documentation

## 2013-01-17 DIAGNOSIS — I5020 Unspecified systolic (congestive) heart failure: Secondary | ICD-10-CM | POA: Insufficient documentation

## 2013-01-17 DIAGNOSIS — Z79899 Other long term (current) drug therapy: Secondary | ICD-10-CM | POA: Insufficient documentation

## 2013-01-17 DIAGNOSIS — M7989 Other specified soft tissue disorders: Secondary | ICD-10-CM

## 2013-01-17 DIAGNOSIS — E785 Hyperlipidemia, unspecified: Secondary | ICD-10-CM | POA: Insufficient documentation

## 2013-01-17 DIAGNOSIS — I83019 Varicose veins of right lower extremity with ulcer of unspecified site: Secondary | ICD-10-CM

## 2013-01-17 DIAGNOSIS — Z8701 Personal history of pneumonia (recurrent): Secondary | ICD-10-CM | POA: Insufficient documentation

## 2013-01-17 DIAGNOSIS — R509 Fever, unspecified: Secondary | ICD-10-CM | POA: Insufficient documentation

## 2013-01-17 DIAGNOSIS — Z940 Kidney transplant status: Secondary | ICD-10-CM | POA: Insufficient documentation

## 2013-01-17 DIAGNOSIS — Z8679 Personal history of other diseases of the circulatory system: Secondary | ICD-10-CM | POA: Insufficient documentation

## 2013-01-17 LAB — CBC WITH DIFFERENTIAL/PLATELET
Basophils Absolute: 0 10*3/uL (ref 0.0–0.1)
Basophils Relative: 0 % (ref 0–1)
Eosinophils Absolute: 0.1 10*3/uL (ref 0.0–0.7)
MCH: 28.1 pg (ref 26.0–34.0)
MCHC: 32.3 g/dL (ref 30.0–36.0)
Monocytes Absolute: 0.8 10*3/uL (ref 0.1–1.0)
Neutro Abs: 12.6 10*3/uL — ABNORMAL HIGH (ref 1.7–7.7)
Neutrophils Relative %: 86 % — ABNORMAL HIGH (ref 43–77)
Platelets: 242 10*3/uL (ref 150–400)
RDW: 17.6 % — ABNORMAL HIGH (ref 11.5–15.5)

## 2013-01-17 LAB — BASIC METABOLIC PANEL
BUN: 88 mg/dL — ABNORMAL HIGH (ref 6–23)
Creatinine, Ser: 4.19 mg/dL — ABNORMAL HIGH (ref 0.50–1.35)
GFR calc Af Amer: 17 mL/min — ABNORMAL LOW (ref >90)
GFR calc non Af Amer: 15 mL/min — ABNORMAL LOW (ref >90)
Glucose, Bld: 93 mg/dL (ref 70–99)

## 2013-01-17 NOTE — ED Provider Notes (Signed)
CSN: [id number]     Arrival date & time 01/17/13  1127 History   First MD Initiated Contact with Patient 01/17/13 1128     Chief Complaint  Patient presents with  . Leg Swelling    HPI Pt states his left leg has been swelling more than usual since Saturday. Pt usually has home health come to visit and wrap his legs with una boots. Pt's leg has foul smell with some blisters. Pt states he has been running temp that was 100.4 at it's highest, but today was 98.6. EMS vitals BP 142/86, HR 72, 96% on room air. Pt has hx of recent Left femur fx in June 2014.         Past Medical History  Diagnosis Date  . Hypertension   . Hepatitis C 1996  . CKD (chronic kidney disease), stage IV     a. s/p renal transplant 1996.  Marland Kitchen Pneumonia   . HLD (hyperlipidemia)   . Nonischemic cardiomyopathy     a. unknown etiology, EF 30-35% by echo 09/2011;  b. 09/2011 Lexi MV EF 42%, no ischemia/infarct.  . Systolic CHF   . Atrial flutter     a. in the setting of sepsis 05/2011; on amio; no anticoagulation   Past Surgical History  Procedure Laterality Date  . Nephrectomy transplanted organ    . Insertion of dialysis catheter  06/20/2011    Procedure: INSERTION OF DIALYSIS CATHETER;  Surgeon: [name], MD;  Location: [facility] OR;  Service: Vascular;  Laterality: Right;  Ultrasound guided insertion of right internal jugular dialysis catheter  . Multiple extractions with alveoloplasty  06/27/2011    Procedure: MULTIPLE EXTRACION WITH ALVEOLOPLASTY;  Surgeon: [name], DDS;  Location: [facility] OR;  Service: Oral Surgery;  Laterality: N/A;  Extraction  of tooth # 14 with alveoloplasty  . Hip surgery    . Joint replacement      left hip  . Femur fracture surgery Left 07/09/2012    Dr Deno Etienne  . Femur im nail Left 07/10/2012    Procedure: IM FEMORAL NAIL;  Surgeon: Toni Arthurs, MD;  Location: [facility] OR;  Service: Orthopedics;  Laterality: Left;  . I&d extremity Left 08/24/2012    Procedure: LEFT LEG IRRIGATION AND  DEBRIDEMENT WITH POSSIBLE WOUND VAC APPLICATION;  Surgeon: Toni Arthurs, MD;  Location: [facility] OR;  Service: Orthopedics;  Laterality: Left;   Family History  Problem Relation Age of Onset  . Heart disease Neg Hx    History  Substance Use Topics  . Smoking status: Never Smoker   . Smokeless tobacco: Never Used  . Alcohol Use: No    Review of Systems All other systems reviewed and are negative Allergies  Review of patient's allergies indicates no known allergies.  Home Medications   Current Outpatient Rx  Name  Route  Sig  Dispense  Refill  . acetaminophen (TYLENOL) 500 MG tablet   Oral   Take 1,000 mg by mouth every 6 (six) hours as needed.         Marland Kitchen allopurinol (ZYLOPRIM) 100 MG tablet   Oral   Take 1 tablet (100 mg total) by mouth 2 (two) times daily.   60 tablet   1   . amiodarone (PACERONE) 200 MG tablet   Oral   Take 200 mg by mouth daily.         Marland Kitchen amoxicillin-clavulanate (AUGMENTIN) 500-125 MG per tablet   Oral   Take 1 tablet (500 mg total) by mouth 2 (  two) times daily.   60 tablet   2   . aspirin 325 MG tablet   Oral   Take 325 mg by mouth daily.         . carvedilol (COREG) 25 MG tablet   Oral   Take 25 mg by mouth 2 (two) times daily with a meal.         . furosemide (LASIX) 20 MG tablet   Oral   Take 40 mg by mouth 2 (two) times daily.         . hydrALAZINE (APRESOLINE) 50 MG tablet   Oral   Take 50 mg by mouth 3 (three) times daily.         . isosorbide mononitrate (IMDUR) 60 MG 24 hr tablet   Oral   Take 60 mg by mouth daily.         . methocarbamol (ROBAXIN) 500 MG tablet   Oral   Take 500 mg by mouth daily.          Marland Kitchen omeprazole (PRILOSEC) 20 MG capsule   Oral   Take 1 capsule (20 mg total) by mouth daily.   30 capsule   1   . potassium chloride SA (K-DUR,KLOR-CON) 20 MEQ tablet   Oral   Take 20 mEq by mouth daily.         . pravastatin (PRAVACHOL) 40 MG tablet   Oral   Take 40 mg by mouth daily.         .  predniSONE (DELTASONE) 10 MG tablet   Oral   Take 1 tablet (10 mg total) by mouth daily.   30 tablet   1   . sirolimus (RAPAMUNE) 1 MG/ML solution   Oral   Take 1.2 mg by mouth every morning.           BP 149/94  Pulse 79  Temp(Src) 98.4 F (36.9 C) (Oral)  Resp 20  Ht 6\' 1"  (1.854 m)  Wt 242 lb (109.77 kg)  BMI 31.93 kg/m2  SpO2 95% Physical Exam  Nursing note and vitals reviewed. Constitutional: He is oriented to person, place, and time. He appears well-developed and well-nourished. No distress.  HENT:  Head: Normocephalic and atraumatic.  Eyes: Pupils are equal, round, and reactive to light.  Neck: Normal range of motion.  Cardiovascular: Normal rate and intact distal pulses.   Pulmonary/Chest: No respiratory distress.  Abdominal: Normal appearance. He exhibits no distension. There is no tenderness.  Musculoskeletal: He exhibits edema (Massive lymphedema to left and right lower extremities with left being significantly greater than the right.  Some erythema also noted prominent on the left lower extremity).  Neurological: He is alert and oriented to person, place, and time. No cranial nerve deficit.  Skin: Skin is warm and dry. No rash noted.  Psychiatric: He has a normal mood and affect. His behavior is normal.    ED Course  Procedures (including critical care time) Labs Review Labs Reviewed  BASIC METABOLIC PANEL - Abnormal; Notable for the following:    Sodium 134 (*)    Chloride 94 (*)    BUN 88 (*)    Creatinine, Ser 4.19 (*)    GFR calc non Af Amer 15 (*)    GFR calc Af Amer 17 (*)    All other components within normal limits  CBC WITH DIFFERENTIAL - Abnormal; Notable for the following:    WBC 14.8 (*)    RBC 3.91 (*)    Hemoglobin 11.0 (*)  HCT 34.1 (*)    RDW 17.6 (*)    Neutrophils Relative % 86 (*)    Neutro Abs 12.6 (*)    Lymphocytes Relative 9 (*)    All other components within normal limits  PRO B NATRIURETIC PEPTIDE - Abnormal; Notable  for the following:    Pro B Natriuretic peptide (BNP) 19699.0 (*)    All other components within normal limits   Imaging Review No results found.  EKG Interpretation   None       After treatment in the ED the patient feels back to baseline and wants to go home.  MDM   1. Lymphedema   2. Stasis leg ulcer, right        Nelia Shi, MD 01/23/13 2121

## 2013-01-17 NOTE — ED Notes (Signed)
Pt waiting for [facility] to transport home. Pt will be calling home health tonight to arrange for them to come out and change dressing tomorrow.

## 2013-01-17 NOTE — ED Notes (Signed)
Pt states his left leg has been swelling more than usual since Saturday. Pt usually has home health come to visit and wrap his legs with una boots. Pt's leg has foul smell with some blisters. Pt states he has been running temp that was 100.4 at it's highest, but today was 98.6. EMS vitals BP 142/86, HR 72, 96% on room air. Pt has hx of recent Left femur fx in June 2014.

## 2013-01-17 NOTE — ED Notes (Signed)
Pt returned from vascular

## 2013-01-17 NOTE — ED Notes (Addendum)
NAD at this time. VS are stable. Pt being transported home by [facility].

## 2013-01-17 NOTE — Progress Notes (Signed)
*  PRELIMINARY RESULTS* Vascular Ultrasound Left lower extremity venous duplex has been completed.  Preliminary findings: technically limited study due to body habitus and edema. No evidence of DVT in visualized veins.  Calf veins were not visualized.    Farrel Demark, RDMS, RVT  01/17/2013, 3:41 PM

## 2013-01-17 NOTE — ED Notes (Signed)
Wrapped pt's right leg wounds with ointment and irrigated. Pt will have home health nurse come out tomorrow and rewrap una boots. [facility] has been called for transportation to home. Pt is stable for discharge.

## 2013-01-28 ENCOUNTER — Other Ambulatory Visit: Payer: Self-pay | Admitting: Cardiology

## 2013-02-03 ENCOUNTER — Other Ambulatory Visit: Payer: Self-pay | Admitting: Cardiology

## 2013-02-07 ENCOUNTER — Telehealth: Payer: Self-pay | Admitting: Cardiology

## 2013-02-07 ENCOUNTER — Encounter: Payer: Self-pay | Admitting: Cardiology

## 2013-02-07 ENCOUNTER — Ambulatory Visit (INDEPENDENT_AMBULATORY_CARE_PROVIDER_SITE_OTHER): Payer: Self-pay | Admitting: Cardiology

## 2013-02-07 VITALS — BP 160/100 | HR 72 | Ht 73.0 in | Wt 221.4 lb

## 2013-02-07 DIAGNOSIS — I509 Heart failure, unspecified: Secondary | ICD-10-CM

## 2013-02-07 DIAGNOSIS — I5022 Chronic systolic (congestive) heart failure: Secondary | ICD-10-CM

## 2013-02-07 DIAGNOSIS — G4733 Obstructive sleep apnea (adult) (pediatric): Secondary | ICD-10-CM

## 2013-02-07 DIAGNOSIS — I4892 Unspecified atrial flutter: Secondary | ICD-10-CM

## 2013-02-07 DIAGNOSIS — N189 Chronic kidney disease, unspecified: Secondary | ICD-10-CM

## 2013-02-07 LAB — HEPATIC FUNCTION PANEL
ALBUMIN: 3.1 g/dL — AB (ref 3.5–5.2)
ALK PHOS: 56 U/L (ref 39–117)
ALT: 13 U/L (ref 0–53)
AST: 20 U/L (ref 0–37)
Bilirubin, Direct: 0 mg/dL (ref 0.0–0.3)
Total Bilirubin: 0.6 mg/dL (ref 0.3–1.2)
Total Protein: 7.8 g/dL (ref 6.0–8.3)

## 2013-02-07 LAB — BASIC METABOLIC PANEL
BUN: 59 mg/dL — AB (ref 6–23)
CHLORIDE: 103 meq/L (ref 96–112)
CO2: 28 mEq/L (ref 19–32)
Calcium: 9 mg/dL (ref 8.4–10.5)
Creatinine, Ser: 3.3 mg/dL — ABNORMAL HIGH (ref 0.4–1.5)
GFR: 24.85 mL/min — ABNORMAL LOW (ref >60.00)
Glucose, Bld: 94 mg/dL (ref 70–99)
Potassium: 4.8 mEq/L (ref 3.5–5.1)
Sodium: 145 mEq/L (ref 135–145)

## 2013-02-07 LAB — BRAIN NATRIURETIC PEPTIDE: Pro B Natriuretic peptide (BNP): 1069 pg/mL — ABNORMAL HIGH (ref 0.0–100.0)

## 2013-02-07 LAB — TSH: TSH: 4.9 u[IU]/mL (ref 0.35–5.50)

## 2013-02-07 MED ORDER — HYDRALAZINE HCL 50 MG PO TABS: ORAL_TABLET | ORAL | Status: DC

## 2013-02-07 MED ORDER — ISOSORBIDE MONONITRATE ER 60 MG PO TB24: ORAL_TABLET | ORAL | Status: DC

## 2013-02-07 MED ORDER — METOLAZONE 2.5 MG PO TABS: ORAL_TABLET | ORAL | Status: DC

## 2013-02-07 NOTE — Telephone Encounter (Signed)
New message ° ° ° °Returning nurses call °

## 2013-02-07 NOTE — Patient Instructions (Signed)
Call me when you get home and let me know the dose of lasix (furosemide) you have been taking. Katina Dung [phone] ask for me. Dr Shirlee Latch wants to adjust your fluid pill but he wants to know what you are taking now.   Increase hydralazine to 75mg  three times a day. This will be 1 and 1/2 of a 50mg  tablet three times a day.   Increase Imdur (isosorbide) to 90mg  daily. This will be 1 and 1/2 of a 60mg  tablet daily.  Your physician recommends that you have  lab work today--Liver profile/TSH/BMET/BNP.  Your physician recommends that you schedule a follow-up appointment in: 1 week with Dr Shirlee Latch in the [facility] at [facility].  Your physician recommends that you have  lab work in: 1 week when you see Dr McLean--BMET/BNP.  You have been referred to Physical Therapy --[facility] should be in touch with you in the next day or so.

## 2013-02-07 NOTE — Telephone Encounter (Signed)
Spoke with patient about current lasix dose

## 2013-02-08 ENCOUNTER — Other Ambulatory Visit: Payer: Self-pay | Admitting: *Deleted

## 2013-02-08 ENCOUNTER — Telehealth: Payer: Self-pay | Admitting: *Deleted

## 2013-02-08 MED ORDER — PRAVASTATIN SODIUM 40 MG PO TABS: 40.0000 mg | ORAL_TABLET | Freq: Every day | ORAL | Status: DC

## 2013-02-08 NOTE — Telephone Encounter (Signed)
Patient Demographics     Patient Name Sex DOB SSN Address Phone    Chad [name], Chad [name] Male November 15, 1955 [id number] [address] [place] [address] [phone] [facility]) [phone] ([place])                    [name] Jacqlyn Krauss, RN             Unfortunately, Medicaid will not pay for physical therapy unless the pt has had an amputation, joint replacement, CVA, spinal cord injury. I don't see any of those things in this pt's office visit note.  Let me know if you have questions.  Thank you.      02/08/13 pt notified. He states he saw the orthopedic doctor yesterday and he does not think he is ready for physical therapy right now anyway.

## 2013-02-08 NOTE — Telephone Encounter (Signed)
Chad [name], we had him start metolozone, correct?

## 2013-02-08 NOTE — Progress Notes (Signed)
Patient ID: Chad BargesKenneth F [name], male   DOB: 01/17/1956, 58 y.o.   MRN: [id number] Nephrology: Dr. Allena KatzPatel  58 yo with history of renal transplant, HCV, paroxysmal atrial flutter, and chronic systolic CHF presents for cardiology followup.  He has had a complicated clinical course.  In 5/13, he developed parainfluenza PNA that progressed to ARDS.  He had septic shock and was briefly on CVVH.  He went into atrial flutter with RVR (converted back to NSR spontaneously).  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  He went to inpatient rehab until 6/13. In 7/13, he was re-hospitalized with a lower leg wound infection.  Proteus and Serratia grew out.  He was admitted again in 9/13 with acute/chronic diastolic CHF.  Repeat echo showed EF 30-35%.  Lexiscan myoview showed EF 42% with no ischemia or infarction.  I saw him around this time but have not seen him back for about a year.   Since the last time I saw him, he fell in 6/14 and had a distal femur fracture with ORIF.  This was complicated by a wound infection with Proteus and Enterococcus and he had a re-operation.  He remains on Augmentin.  He is now wheelchair-bound.  He now has massive lower extremity edema and legs are wrapped.  He does very little walking and is unstable when he does walk. He has dyspnea walking short distances.  He is under a lot of stress from medical bills and has lost his house and his job.  He has orthopnea but no PND.  No tachypalpitations or chest pain.  He is in NSR today.  After some discussion, we figured out that he is currently actually taking Lasix 160 mg bid.  Last creatinine was 4.19.  He has not seen nephrology recently either (since Dr. Caryn SectionFox retired).    Labs (9/13): K 4.2 => 4.6, creatinine 3.12 => 3.2, BNP 25000, TSH normal, LDL 153, HDL 40, LFTs normal Labs (78/2911/13): K 3.3, creatinine 3.1, BUN 55 Labs (9/14): creatinine 2.82 Labs (12/14): K 4.3, creatinine 4.19, BUN 88, BNP 19699, HCT 34.1  ECG: NSR, QTc  529 msec  PMH: 1. Atrial flutter in setting of septic shock/PNA in 5/13.  2. CKD: Renal transplant 1996. Now baseline creatinine is around 3.  3. HTN 4. HCV 5. OSA: not currently on CPAP.  6. Parainfluenza PNA with ARDS and septic shock in 5/13.  He required CVVH briefly.  Recurrent PNA in 11/13.  7. Lower leg wound infection 7/13 with Proteus and Serratia.  8. Chronic systolic CHF: Echo (5/13) with EF 15-20%, diffuse hypokinesis.  Repeat echo in (9/13) with EF 30-35%, diffuse hypokinesis worse inferoposteriorly, mild to moderate MR with mild MS, mildly dilated RV with mildly decreased systolic function, PASP 73 mmHg.  Lexiscan myoview (9/13) with EF 42%, diffuse hypokinesis, no evidence for ischemia or infarction.  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   9. H/o GI bleed 10. Distal femur fracture with ORIF 6/14 complicated by wound infection.   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   FH: No premature CAD.    ROS: All systems reviewed and negative except as per HPI.   Current Outpatient Prescriptions  Medication Sig Dispense Refill  . acetaminophen (TYLENOL) 500 MG tablet Take 1,000 mg by mouth every 6 (six) hours as needed.      Marland Kitchen. allopurinol (ZYLOPRIM) 100 MG tablet Take 1 tablet (100 mg total) by mouth 2 (  two) times daily.  60 tablet  1  . amiodarone (PACERONE) 200 MG tablet Take 200 mg by mouth daily.      Marland Kitchen amoxicillin-clavulanate (AUGMENTIN) 500-125 MG per tablet Take 1 tablet (500 mg total) by mouth 2 (two) times daily.  60 tablet  2  . aspirin 325 MG tablet Take 325 mg by mouth daily.      . carvedilol (COREG) 25 MG tablet Take 25 mg by mouth 2 (two) times daily with a meal.      . KLOR-CON M20 20 MEQ tablet TAKE 1 TABLET BY MOUTH EVERY DAY  30 tablet  0  . methocarbamol (ROBAXIN) 500 MG tablet Take 500 mg by mouth daily.       . pantoprazole (PROTONIX) 20 MG tablet Take 20 mg by mouth daily.      . potassium  chloride SA (K-DUR,KLOR-CON) 20 MEQ tablet Take 20 mEq by mouth daily.      . pravastatin (PRAVACHOL) 40 MG tablet Take 40 mg by mouth daily.      . predniSONE (DELTASONE) 10 MG tablet Take 1 tablet (10 mg total) by mouth daily.  30 tablet  1  . sirolimus (RAPAMUNE) 1 MG/ML solution Take 1.2 mg by mouth every morning.       . carvedilol (COREG) 25 MG tablet TAKE 1 TABLET BY MOUTH TWICE A DAY WITH A MEAL  60 tablet  3  . furosemide (LASIX) 80 MG tablet 2 tablets (total 160mg ) two times a day      . hydrALAZINE (APRESOLINE) 50 MG tablet 1 and 1/2 tablets (total 75mg ) three times a day  135 tablet  3  . isosorbide mononitrate (IMDUR) 60 MG 24 hr tablet 1 and 1/2 tablets (total 90mg ) daily  45 tablet  3  . metolazone (ZAROXOLYN) 2.5 MG tablet 1 tablet (2.5mg ) thirty minutes before your morning lasix on Tues and Fri mornings  10 tablet  3   No current facility-administered medications for this visit.    BP 160/100  Pulse 72  Ht 6\' 1"  (1.854 m)  Wt 100.426 kg (221 lb 6.4 oz)  BMI 29.22 kg/m2 General: NAD, obese Neck: JVP 11-12 cm, no thyromegaly or thyroid nodule.  Lungs: Clear to auscultation bilaterally with normal respiraorty effort. CV: Nondisplaced PMI.  Heart regular S1/S2, no S3/S4, 2/6 SEM.  Legs wrapped with 2+ edema to thighs.  No carotid bruit.    Abdomen: Soft, nontender, no hepatosplenomegaly, no distention.  Neurologic: Alert and oriented x 3.  Psych: Normal affect. Extremities: No clubbing or cyanosis.   Assessment/Plan: 1. Chronic systolic CHF:  EF 30-35% on last echo.  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  Prior myoview showed no ischemia or infarction.  I have not seen him in about a year and he is quite volume overloaded on a high dose of Lasix (160 mg bid).  BP is running high (he has been out of hydralazine).  - Continue current Coreg, not candidate at this time for ACEI or spironolactone with renal failure.  - Restart hydralazine at 75 mg tid with Imdur 90 mg  daily.  - Continue Lasix 160 mg bid and add metolozone 2.5 mg twice weekly.   - Repeat echo.  If EF remains low, need to consider ICD eventually but will need to make sure leg infection is cleared.  - BMET/BNP today and in 1 week.   2. Atrial flutter: Patient has maintained NSR on amiodarone.  He is not on coumadin due to  history of GI bleeding.  Continue amiodarone. Check LFTs and TSH today.  He should have a yearly eye exam.  3. OSA: Has OSA but does not have a CPAP machine.  This will need to be addressed eventually. 4. Hyperlipidemia:  He is on pravastatin, will need lipids at followup.  5. CKD: S/p transplant.  Creatinine higher in 12/14.  Recheck today and follow carefully with diuresis.  He will need to re-establish with nephrology, says he is seeing Dr. Allena Katz next week.   6. HTN: BP high but going to restart hydralazine.  Will follow closely.  7. Poor mobility, will arrange PT.   Followup in 1 week in CHF clinic.   Marca Ancona 02/08/2013

## 2013-02-16 ENCOUNTER — Ambulatory Visit (HOSPITAL_COMMUNITY)
Admission: RE | Admit: 2013-02-16 | Discharge: 2013-02-16 | Disposition: A | Discharge status: Home / Self Care | Payer: Self-pay | Source: Ambulatory Visit | Attending: Cardiology | Admitting: Cardiology

## 2013-02-16 VITALS — BP 126/88 | HR 72 | Wt 269.1 lb

## 2013-02-16 DIAGNOSIS — I480 Paroxysmal atrial fibrillation: Secondary | ICD-10-CM

## 2013-02-16 DIAGNOSIS — I5022 Chronic systolic (congestive) heart failure: Secondary | ICD-10-CM

## 2013-02-16 DIAGNOSIS — I509 Heart failure, unspecified: Secondary | ICD-10-CM

## 2013-02-16 DIAGNOSIS — I4892 Unspecified atrial flutter: Secondary | ICD-10-CM

## 2013-02-16 DIAGNOSIS — I4891 Unspecified atrial fibrillation: Secondary | ICD-10-CM

## 2013-02-16 DIAGNOSIS — I129 Hypertensive chronic kidney disease with stage 1 through stage 4 chronic kidney disease, or unspecified chronic kidney disease: Secondary | ICD-10-CM | POA: Insufficient documentation

## 2013-02-16 DIAGNOSIS — N189 Chronic kidney disease, unspecified: Secondary | ICD-10-CM

## 2013-02-16 DIAGNOSIS — G4733 Obstructive sleep apnea (adult) (pediatric): Secondary | ICD-10-CM

## 2013-02-16 LAB — BASIC METABOLIC PANEL
BUN: 59 mg/dL — ABNORMAL HIGH (ref 6–23)
CO2: 32 meq/L (ref 19–32)
Calcium: 9.4 mg/dL (ref 8.4–10.5)
Chloride: 90 mEq/L — ABNORMAL LOW (ref 96–112)
Creatinine, Ser: 3.57 mg/dL — ABNORMAL HIGH (ref 0.50–1.35)
GFR calc Af Amer: 20 mL/min — ABNORMAL LOW (ref >90)
GFR calc non Af Amer: 18 mL/min — ABNORMAL LOW (ref >90)
Glucose, Bld: 99 mg/dL (ref 70–99)
Potassium: 4.1 mEq/L (ref 3.7–5.3)
Sodium: 140 mEq/L (ref 137–147)

## 2013-02-16 LAB — LIPID PANEL
CHOL/HDL RATIO: 4.6 ratio
Cholesterol: 202 mg/dL — ABNORMAL HIGH (ref 0–200)
HDL: 44 mg/dL (ref >39)
LDL Cholesterol: 124 mg/dL — ABNORMAL HIGH (ref 0–99)
Triglycerides: 170 mg/dL — ABNORMAL HIGH (ref <150)
VLDL: 34 mg/dL (ref 0–40)

## 2013-02-16 LAB — PRO B NATRIURETIC PEPTIDE: Pro B Natriuretic peptide (BNP): 12379 pg/mL — ABNORMAL HIGH (ref 0–125)

## 2013-02-16 NOTE — Progress Notes (Signed)
Patient ID: Chad [name], male   DOB: 07/04/1955, 58 y.o.   MRN: [id number] Nephrology: Dr. Allena Katz PCP: Dr Allena Katz  Orthopedic: Dr [name]  58 yo with history of renal transplant, HCV, paroxysmal atrial flutter, and chronic systolic CHF.  He has had a complicated clinical course.  In 05/2011, he developed parainfluenza PNA that progressed to ARDS.  He had septic shock and was briefly on CVVH.  He went into atrial flutter with RVR (converted back to NSR spontaneously).  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  He went to inpatient rehab until 06/2011. In 07/2011, he was re-hospitalized with a lower leg wound infection.  Proteus and Serratia grew out.  He was admitted again in 09/2011 with acute/chronic diastolic CHF.  Repeat echo showed EF 30-35%.  Lexiscan myoview showed EF 42% with no ischemia or infarction.    06/2012 he had a distal femur fracture with ORIF.  This was complicated by a wound infection with Proteus and Enterococcus and he had a re-operation.  He remains on Augmentin and is now wheelchair-bound.    Follow up: Last visit started hydralazine 75 mg TID and IMDUR 90 mg daily. He was also started on metolazone 2.5 mg twice a week. Saw Dr. [name] last week with [facility] and leg is not healing and is trying to get insurance to approve sonar therapy. Also saw Dr. Allena Katz nephrologist last week and he did not change any meds. Not getting around much right now because orthopedic does not want him doing too much. Reports that LE edema is much improved. +orthopnea (not sure is he needs all the pillows for comfort or SOB). Denies PND, CP or dizziness. Denies SOB with minimal activity. Reports increased UOP with metolazone. Following low salt diet and drinking less than 2L a day.   Labs (9/13): K 4.2 => 4.6, creatinine 3.12 => 3.2, BNP 25000, TSH normal, LDL 153, HDL 40, LFTs normal Labs (10/27): K 3.3, creatinine 3.1, BUN 55 Labs (9/14): creatinine 2.82 Labs (12/14): K 4.3,  creatinine 4.19, BUN 88, BNP 19699, HCT 34.1 Labs (1/15): Pro-BNP 1069, K+ 4.8, Cr 3.3, TSH 4.9, AST 20, ALT 13  PMH: 1. Atrial flutter in setting of septic shock/PNA in 5/13.  2. CKD: Renal transplant 1996. Now baseline creatinine is around 3.  3. HTN 4. HCV 5. OSA: not currently on CPAP.  6. Parainfluenza PNA with ARDS and septic shock in 5/13.  He required CVVH briefly.  Recurrent PNA in 11/13.  7. Lower leg wound infection 7/13 with Proteus and Serratia.  8. Chronic systolic CHF: Echo (5/13) with EF 15-20%, diffuse hypokinesis.  Repeat echo in (9/13) with EF 30-35%, diffuse hypokinesis worse inferoposteriorly, mild to moderate MR with mild MS, mildly dilated RV with mildly decreased systolic function, PASP 73 mmHg.  Lexiscan myoview (9/13) with EF 42%, diffuse hypokinesis, no evidence for ischemia or infarction.  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   9. H/o GI bleed 10. Distal femur fracture with ORIF 6/14 complicated by wound infection.   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   FH: No premature CAD.    ROS: All systems reviewed and negative except as per HPI.   Current Outpatient Prescriptions  Medication Sig Dispense Refill  . acetaminophen (TYLENOL) 500 MG tablet Take 1,000 mg by mouth every 6 (six) hours as needed.      Marland Kitchen allopurinol (ZYLOPRIM) 100 MG tablet Take 1 tablet (  100 mg total) by mouth 2 (two) times daily.  60 tablet  1  . amiodarone (PACERONE) 200 MG tablet Take 200 mg by mouth daily.      Marland Kitchen amoxicillin-clavulanate (AUGMENTIN) 500-125 MG per tablet Take 1 tablet (500 mg total) by mouth 2 (two) times daily.  60 tablet  2  . aspirin 325 MG tablet Take 325 mg by mouth daily.      . carvedilol (COREG) 25 MG tablet TAKE 1 TABLET BY MOUTH TWICE A DAY WITH A MEAL  60 tablet  3  . furosemide (LASIX) 80 MG tablet 2 tablets (total 160mg ) two times a day      . hydrALAZINE (APRESOLINE) 50 MG tablet 1 and 1/2  tablets (total 75mg ) three times a day  135 tablet  3  . isosorbide mononitrate (IMDUR) 60 MG 24 hr tablet 1 and 1/2 tablets (total 90mg ) daily  45 tablet  3  . KLOR-CON M20 20 MEQ tablet TAKE 1 TABLET BY MOUTH EVERY DAY  30 tablet  0  . methocarbamol (ROBAXIN) 500 MG tablet Take 500 mg by mouth daily.       . metolazone (ZAROXOLYN) 2.5 MG tablet 1 tablet (2.5mg ) thirty minutes before your morning lasix on Tues and Fri mornings  10 tablet  3  . pantoprazole (PROTONIX) 20 MG tablet Take 20 mg by mouth daily.      . pravastatin (PRAVACHOL) 40 MG tablet Take 1 tablet (40 mg total) by mouth daily.  30 tablet  6  . predniSONE (DELTASONE) 10 MG tablet Take 1 tablet (10 mg total) by mouth daily.  30 tablet  1  . sirolimus (RAPAMUNE) 1 MG/ML solution Take 1.2 mg by mouth every morning.        No current facility-administered medications for this encounter.   Filed Vitals:   02/16/13 1433  BP: 126/88  Pulse: 72  Weight: 269 lb 1.9 oz (122.072 kg)  SpO2: 92%    General: NAD, obese, in wheelchair Neck: JVP 8-9 cm, no thyromegaly or thyroid nodule.  Lungs: Clear to auscultation bilaterally with normal respiraorty effort. CV: Nondisplaced PMI.  Heart regular S1/S2, no S3/S4, 2/6 SEM.  Legs wrapped with 2+ edema to thighs.  No carotid bruit.    Abdomen: Soft, nontender, no hepatosplenomegaly, no distention.  Neurologic: Alert and oriented x 3.  Psych: Normal affect. Extremities: No clubbing or cyanosis.   Assessment/Plan:  1. Chronic systolic CHF: EF 69-62% (11/2011).  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  Prior myoview showed no ischemia or infarction. Last visit started metolazone 2.5 mg twice a week and reports increased UOP, however still has some volume. Will increase metolazone to 2.5 mg every Tuesday, Thursday and Saturday. There was a big discrepancy in weight from last week to this week, but do not think last week's was accurate. - Coreg at goal dose 25 mg BID. Not candidate at  this time for ACEI or spironolactone with renal failure.  - Will increase hydralazine to 100 mg TID and continue IMDUR 90 mg daily. - BMET and pro-BNP today and repeat 2 weeks with ECHO. - Reinforced the need and importance of daily weights, a low sodium diet, and fluid restriction (less than 2 L a day). Instructed to call the HF clinic if weight increases more than 3 lbs overnight or 5 lbs in a week.  2. Atrial flutter: Patient appears to be in NSR today. He is not on coumadin due to history of GI bleeding.  Continue  amiodarone. Last LFTs and TSH good (01/2013).  He should have a yearly eye exam.  3. OSA: Has OSA but does not have a CPAP machine.  This will need to be addressed eventually. 4. Hyperlipidemia:  He is on pravastatin, check lipids today.   5. CKD: S/p transplant. Cr stable. Followed up with Dr. Allena KatzPatel last week. Recheck today and follow carefully with diuresis.   6. HTN: BP much improved. Will increase hydralazine for afterload reduction.   F/U 1 month in clinic and with Caro LarocheJackie, SW  Ulla Potashosgrove, Ali B NP-C 02/16/2013  Patient seen with NP, agree with the above note.  Still volume overloaded but improving.  Will increase metolazone to tiw and follow creatinine.  Will increase hydralazine to 100 mg tid and Imdur to 90.  He will need echo with plan for ICD if EF remains low, as I suspect it does.   Marca AnconaDalton McLean 02/18/2013

## 2013-02-16 NOTE — Patient Instructions (Addendum)
Will increase metolazone to 2.5 mg to Tuesday, Thursday, and Saturday.   Back in 2 weeks for labs and ECHO.  Follow up in 1 month  Do the following things EVERYDAY: 1) Weigh yourself in the morning before breakfast. Write it down and keep it in a log. 2) Take your medicines as prescribed 3) Eat low salt foods-Limit salt (sodium) to 2000 mg per day.  4) Stay as active as you can everyday 5) Limit all fluids for the day to less than 2 liters 6)

## 2013-02-17 ENCOUNTER — Encounter (HOSPITAL_COMMUNITY): Payer: Self-pay | Admitting: *Deleted

## 2013-02-18 ENCOUNTER — Telehealth: Payer: Self-pay | Admitting: Licensed Clinical Social Worker

## 2013-02-18 NOTE — Telephone Encounter (Signed)
CSW rec'd referral to assist patient with community resources. CSW attempted to contact patient and left message for return call. Lasandra Beech, LCSW [phone]

## 2013-02-21 ENCOUNTER — Other Ambulatory Visit (HOSPITAL_COMMUNITY): Payer: Self-pay | Admitting: Cardiology

## 2013-02-21 DIAGNOSIS — I509 Heart failure, unspecified: Secondary | ICD-10-CM

## 2013-02-21 MED ORDER — METOLAZONE 2.5 MG PO TABS: ORAL_TABLET | ORAL | Status: DC

## 2013-02-21 NOTE — Telephone Encounter (Signed)
New rx sent to pharm to reflect increase in dose

## 2013-02-22 ENCOUNTER — Telehealth: Payer: Self-pay | Admitting: Licensed Clinical Social Worker

## 2013-02-22 NOTE — Telephone Encounter (Signed)
CSW contacted patient by phone to offer support and resources per referral. Patient spoke of long medical history including kidney transplant, hip replacement and heart failure. 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 He states that he recently rec'd a back check from [facility] but has not received a formal approval letter from [employer]. He states he has additional medical bills to submit to [facility] for approval and hopeful to have coverage soon. CSW encouraged patient to contact Social Security case manager to obtain approval letter and follow up with medicaid. Patient will follow up with CSW on March 07, 2013 next clinic visit. Lasandra Beech, LCSW [phone]

## 2013-02-23 DIAGNOSIS — I509 Heart failure, unspecified: Secondary | ICD-10-CM

## 2013-02-23 DIAGNOSIS — S72009D Fracture of unspecified part of neck of unspecified femur, subsequent encounter for closed fracture with routine healing: Secondary | ICD-10-CM

## 2013-02-23 DIAGNOSIS — R9389 Abnormal findings on diagnostic imaging of other specified body structures: Secondary | ICD-10-CM

## 2013-02-23 DIAGNOSIS — Z452 Encounter for adjustment and management of vascular access device: Secondary | ICD-10-CM

## 2013-02-23 DIAGNOSIS — T8140XA Infection following a procedure, unspecified, initial encounter: Secondary | ICD-10-CM

## 2013-02-25 ENCOUNTER — Other Ambulatory Visit: Payer: Self-pay | Admitting: Cardiology

## 2013-02-25 ENCOUNTER — Encounter (HOSPITAL_COMMUNITY): Payer: Self-pay | Admitting: *Deleted

## 2013-02-28 ENCOUNTER — Other Ambulatory Visit: Payer: Self-pay | Admitting: Cardiology

## 2013-03-05 ENCOUNTER — Other Ambulatory Visit: Payer: Self-pay | Admitting: Internal Medicine

## 2013-03-05 DIAGNOSIS — T8450XA Infection and inflammatory reaction due to unspecified internal joint prosthesis, initial encounter: Secondary | ICD-10-CM

## 2013-03-07 ENCOUNTER — Encounter: Payer: Self-pay | Admitting: Licensed Clinical Social Worker

## 2013-03-07 ENCOUNTER — Ambulatory Visit (HOSPITAL_COMMUNITY)
Admission: RE | Admit: 2013-03-07 | Discharge: 2013-03-07 | Disposition: A | Discharge status: Home / Self Care | Payer: Medicaid Other | Source: Ambulatory Visit | Attending: Cardiology | Admitting: Cardiology

## 2013-03-07 DIAGNOSIS — I2789 Other specified pulmonary heart diseases: Secondary | ICD-10-CM | POA: Insufficient documentation

## 2013-03-07 DIAGNOSIS — I080 Rheumatic disorders of both mitral and aortic valves: Secondary | ICD-10-CM | POA: Insufficient documentation

## 2013-03-07 DIAGNOSIS — N189 Chronic kidney disease, unspecified: Secondary | ICD-10-CM | POA: Insufficient documentation

## 2013-03-07 DIAGNOSIS — I428 Other cardiomyopathies: Secondary | ICD-10-CM | POA: Insufficient documentation

## 2013-03-07 DIAGNOSIS — I4892 Unspecified atrial flutter: Secondary | ICD-10-CM | POA: Insufficient documentation

## 2013-03-07 DIAGNOSIS — I5022 Chronic systolic (congestive) heart failure: Secondary | ICD-10-CM

## 2013-03-07 DIAGNOSIS — I129 Hypertensive chronic kidney disease with stage 1 through stage 4 chronic kidney disease, or unspecified chronic kidney disease: Secondary | ICD-10-CM | POA: Insufficient documentation

## 2013-03-07 DIAGNOSIS — G4733 Obstructive sleep apnea (adult) (pediatric): Secondary | ICD-10-CM | POA: Insufficient documentation

## 2013-03-07 DIAGNOSIS — I509 Heart failure, unspecified: Secondary | ICD-10-CM | POA: Insufficient documentation

## 2013-03-07 DIAGNOSIS — I359 Nonrheumatic aortic valve disorder, unspecified: Secondary | ICD-10-CM

## 2013-03-07 DIAGNOSIS — Z940 Kidney transplant status: Secondary | ICD-10-CM | POA: Insufficient documentation

## 2013-03-07 NOTE — Progress Notes (Signed)
  Echocardiogram 2D Echocardiogram has been performed.  Chad [name] 03/07/2013, 11:37 AM

## 2013-03-07 NOTE — Progress Notes (Signed)
CSW met with patient in the clinic to follow up on phone interview. Patient reports he has rec'd his SSD and confirmation letter from [employer]. Patient states that he hopes to get Medicare by the end of 2015. He currently has some additional unpaid medical  bills that he will need to submit for eligibility for medicaid approval. He spoke of his supportive son who assists him with transport to his medical appointments and overall support. 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 Patient was in good spirits and will follow up with CSW on next visit to clinic 03/21/13. CSW offered support and encouraged follow through with medical bills to [facility] worker for approval. CSW continues to be available as needed. Raquel Sarna, [place]

## 2013-03-08 ENCOUNTER — Telehealth (HOSPITAL_COMMUNITY): Payer: Self-pay | Admitting: Anesthesiology

## 2013-03-08 NOTE — Telephone Encounter (Signed)
Updated patient on ECHO results. EF improved to 55% from 35%. Grade II diastolic dysfunction. Mild AS and mild aortic insufficiency. Reports feeling much better since increase in metolazone. Will f/u next week in clinic.    Ulla Potash B NP-C 4:50 PM

## 2013-03-21 ENCOUNTER — Ambulatory Visit (HOSPITAL_COMMUNITY)
Admission: RE | Admit: 2013-03-21 | Discharge: 2013-03-21 | Disposition: A | Discharge status: Home / Self Care | Payer: Self-pay | Source: Ambulatory Visit | Attending: Internal Medicine | Admitting: Internal Medicine

## 2013-03-21 ENCOUNTER — Encounter (HOSPITAL_COMMUNITY): Payer: Self-pay

## 2013-03-21 ENCOUNTER — Encounter: Payer: Self-pay | Admitting: Licensed Clinical Social Worker

## 2013-03-21 VITALS — BP 130/86 | HR 64 | Wt 244.1 lb

## 2013-03-21 DIAGNOSIS — I509 Heart failure, unspecified: Secondary | ICD-10-CM

## 2013-03-21 DIAGNOSIS — I4891 Unspecified atrial fibrillation: Secondary | ICD-10-CM | POA: Insufficient documentation

## 2013-03-21 DIAGNOSIS — I5022 Chronic systolic (congestive) heart failure: Secondary | ICD-10-CM

## 2013-03-21 DIAGNOSIS — I4892 Unspecified atrial flutter: Secondary | ICD-10-CM

## 2013-03-21 NOTE — Progress Notes (Signed)
Patient ID: Chad [name], male   DOB: 05-08-1955, 58 y.o.   MRN: [id number] Nephrology: Dr. Allena Katz PCP: Dr Allena Katz  Orthopedic: Dr [name]  58 yo with history of renal transplant, HCV, paroxysmal atrial flutter, and chronic systolic CHF.  He has had a complicated clinical course.  In 05/2011, he developed parainfluenza PNA that progressed to ARDS.  He had septic shock and was briefly on CVVH.  He went into atrial flutter with RVR (converted back to NSR spontaneously).  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  He went to inpatient rehab until 06/2011. In 07/2011, he was re-hospitalized with a lower leg wound infection.  Proteus and Serratia grew out.  He was admitted again in 09/2011 with acute/chronic diastolic CHF.  Repeat echo showed EF 30-35%.  Lexiscan myoview showed EF 42% with no ischemia or infarction.    06/2012 he had a distal femur fracture with ORIF.  This was complicated by a wound infection with Proteus and Enterococcus and he had a re-operation.  He remains on Augmentin and is now wheelchair-bound.    He returns for follow up. Overall he feels much better. Last visit  hydralazine was increased to 100 mg TID and  Metolazone was increased to  2.5 mg three times a week. Denies PND, CP or dizziness. Denies SOB. Lower extremity improved. Reports increased UOP with metolazone. Unable to weigh at home because he does not have rails to stand and weight. NWB on LLE due to fracture. Drinking < 2 liters fluid daily. Following low salt diet. Lives at home with his son. He has AHC to change lower extremity dressing to RLE.   Labs (9/13): K 4.2 => 4.6, creatinine 3.12 => 3.2, BNP 25000, TSH normal, LDL 153, HDL 40, LFTs normal Labs (96/22): K 3.3, creatinine 3.1, BUN 55 Labs (9/14): creatinine 2.82 Labs (12/14): K 4.3, creatinine 4.19, BUN 88, BNP 19699, HCT 34.1 Labs (1/15): Pro-BNP 1069, K+ 4.8, Cr 3.3, TSH 4.9, AST 20, ALT 13 Labs (02/16/13): K 4.1 Creatinine 3.5 Pro BNP 12379    PMH: 1. Atrial flutter in setting of septic shock/PNA in 5/13.  2. CKD: Renal transplant 1996. Now baseline creatinine is around 3.  3. HTN 4. HCV 5. OSA: not currently on CPAP.  6. Parainfluenza PNA with ARDS and septic shock in 5/13.  He required CVVH briefly.  Recurrent PNA in 11/13.  7. Lower leg wound infection 7/13 with Proteus and Serratia.  8. Chronic systolic CHF: Echo (5/13) with EF 15-20%, diffuse hypokinesis.  Repeat echo in (9/13) with EF 30-35%, diffuse hypokinesis worse inferoposteriorly, mild to moderate MR with mild MS, mildly dilated RV with mildly decreased systolic function, PASP 73 mmHg.  Lexiscan myoview (9/13) with EF 42%, diffuse hypokinesis, no evidence for ischemia or infarction.  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   9. H/o GI bleed 10. Distal femur fracture with ORIF 6/14 complicated by wound infection.   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   FH: No premature CAD.    ROS: All systems reviewed and negative except as per HPI.   Current Outpatient Prescriptions  Medication Sig Dispense Refill  . allopurinol (ZYLOPRIM) 100 MG tablet Take 1 tablet (100 mg total) by mouth 2 (two) times daily.  60 tablet  1  . amiodarone (PACERONE) 200 MG tablet TAKE 1 TABLET BY MOUTH EVERY DAY  30 tablet  0  . amoxicillin-clavulanate (AUGMENTIN) 500-125 MG per  tablet TAKE 1 TABLET BY MOUTH TWICE A DAY  60 tablet  0  . aspirin 325 MG tablet Take 325 mg by mouth daily.      . carvedilol (COREG) 25 MG tablet TAKE 1 TABLET BY MOUTH TWICE A DAY WITH A MEAL  60 tablet  3  . furosemide (LASIX) 80 MG tablet 2 tablets (total 160mg ) two times a day      . hydrALAZINE (APRESOLINE) 50 MG tablet 1 and 1/2 tablets (total 75mg ) three times a day  135 tablet  3  . isosorbide mononitrate (IMDUR) 60 MG 24 hr tablet 1 and 1/2 tablets (total 90mg ) daily  45 tablet  3  . KLOR-CON M20 20 MEQ tablet TAKE 1 TABLET BY MOUTH EVERY DAY  30  tablet  6  . methocarbamol (ROBAXIN) 500 MG tablet Take 500 mg by mouth daily.       . metolazone (ZAROXOLYN) 2.5 MG tablet 1 tablet (2.5mg ) thirty minutes before your morning lasix on Tues, Thurs, and Sat mornings  15 tablet  3  . pantoprazole (PROTONIX) 20 MG tablet Take 20 mg by mouth daily.      . pravastatin (PRAVACHOL) 40 MG tablet Take 1 tablet (40 mg total) by mouth daily.  30 tablet  6  . predniSONE (DELTASONE) 10 MG tablet Take 1 tablet (10 mg total) by mouth daily.  30 tablet  1  . Probiotic Product (PROBIOTIC DAILY PO) Take by mouth daily.      . sirolimus (RAPAMUNE) 1 MG/ML solution Take 1.2 mg by mouth every morning.        No current facility-administered medications for this encounter.   Filed Vitals:   03/21/13 1148  BP: 130/86  Pulse: 64  Weight: 244 lb 1.9 oz (110.732 kg)  SpO2: 88%    General: NAD, obese, in wheelchair Neck: JVP 5-6 cm, no thyromegaly or thyroid nodule.  Lungs: Clear to auscultation bilaterally with normal respiraorty effort. CV: Nondisplaced PMI.  Heart regular S1/S2, no S3/S4, 2/6 SEM.   No carotid bruit.    Abdomen: Soft, nontender, no hepatosplenomegaly, no distention.  Neurologic: Alert and oriented x 3.  Psych: Normal affect. Extremities: No clubbing or cyanosis. LLE compression stocking RLE unna boot. No thigh edema.   Assessment/Plan:  1. Chronic systolic CHF: EF 04-54%30-35% (11/2011)--> ECHO 03/07/13 EF 55% normalized.  No need for ICD.  Prior myoview showed no ischemia or infarction. Able to stand on HF clinic scale but unable to weigh at home. Weight down 25 pounds with additional metolazone.  Volume status stable. Continue 160 mg lasix bid and Metolazone 2.5 mg three times a week.  - Coreg at goal dose 25 mg BID. Not candidate at this time for ACEI or spironolactone with renal failure.  -Continue  hydralazine to 100 mg TID and continue IMDUR 90 mg daily. BMET checked at nephrology in early February. Will obtain results.  - Reinforced the  need and importance of daily weights, a low sodium diet, and fluid restriction (less than 2 L a day). Instructed to call the HF clinic if weight increases more than 3 lbs overnight or 5 lbs in a week.  2. Atrial flutter: Maintaining SR on amio. He is not on coumadin due to history of GI bleeding.  Continue amiodarone. Last LFTs and TSH good (01/2013).  He should have a yearly eye exam.  3. OSA: Has OSA but does not have a CPAP machine. Will need to refer to pulmonary but he would like to  hold off for now. Uses oxygen at night.  4. Hyperlipidemia:  Continue statin.   5. CKD: S/P transplant. Cr stable. Followed up with Dr. Allena Katz last week.  6. HTN: BP much improved. Continue current plan.   Follow up in 3 months    CLEGG,AMY NP-C 03/21/2013   Patient seen and examined with Tonye Becket, NP. We discussed all aspects of the encounter. I agree with the assessment and plan as stated above. He is doing well. Volume status ok. Reinforced need for daily weights and reviewed use of sliding scale diuretics. Maintaining NSR on amio - will continue. Not candidate for anti-coagulation due to previous GI bleeding.   Nolin Grell,MD 8:50 PM

## 2013-03-21 NOTE — Patient Instructions (Addendum)
Follow up in 3 months

## 2013-03-21 NOTE — Progress Notes (Signed)
CSW met with patient in the clinic to follow up on last visit. Patient reports that he submitted additional medical bills to [facility] and is now waiting for determination. Patient also reported that he is at risk for foreclosure. Patient reports he is working with [employer] for assistance in preventing foreclosure. He has a pending court appearance but the [employer] has requested a delay in hopes of qualifying him for a relief program. CSW provided support and provided patient with number for future need. CSW available if needed. Raquel Sarna, [place]

## 2013-03-30 ENCOUNTER — Other Ambulatory Visit: Payer: Self-pay | Admitting: Cardiology

## 2013-04-06 ENCOUNTER — Telehealth: Payer: Self-pay | Admitting: General Surgery

## 2013-04-06 NOTE — Telephone Encounter (Signed)
Dr Ladona Ridgel called asking for you to return call in regards to this pts disability. A Peer to peer is necessary for pt to be able to receive the disability. Please call back at [phone].

## 2013-04-07 ENCOUNTER — Other Ambulatory Visit: Payer: Self-pay | Admitting: Internal Medicine

## 2013-04-08 ENCOUNTER — Telehealth: Payer: Self-pay | Admitting: Cardiology

## 2013-04-08 NOTE — Telephone Encounter (Signed)
New message    MD calling for Peer to peer review .

## 2013-04-08 NOTE — Telephone Encounter (Signed)
Peer to peer review needed in order for patient to received disability.  Please call back Dr. Ladona Ridgel at [phone].

## 2013-04-08 NOTE — Telephone Encounter (Signed)
Done

## 2013-04-29 ENCOUNTER — Other Ambulatory Visit: Payer: Self-pay | Admitting: Cardiology

## 2013-05-02 DIAGNOSIS — Z9981 Dependence on supplemental oxygen: Secondary | ICD-10-CM

## 2013-05-02 DIAGNOSIS — I509 Heart failure, unspecified: Secondary | ICD-10-CM

## 2013-05-02 DIAGNOSIS — S72009D Fracture of unspecified part of neck of unspecified femur, subsequent encounter for closed fracture with routine healing: Secondary | ICD-10-CM

## 2013-05-02 DIAGNOSIS — Z5181 Encounter for therapeutic drug level monitoring: Secondary | ICD-10-CM

## 2013-05-02 DIAGNOSIS — R9389 Abnormal findings on diagnostic imaging of other specified body structures: Secondary | ICD-10-CM

## 2013-05-02 DIAGNOSIS — Z4801 Encounter for change or removal of surgical wound dressing: Secondary | ICD-10-CM

## 2013-05-02 DIAGNOSIS — Z452 Encounter for adjustment and management of vascular access device: Secondary | ICD-10-CM

## 2013-05-02 DIAGNOSIS — T8140XA Infection following a procedure, unspecified, initial encounter: Secondary | ICD-10-CM

## 2013-05-26 ENCOUNTER — Other Ambulatory Visit: Payer: Self-pay | Admitting: Cardiology

## 2013-05-30 ENCOUNTER — Other Ambulatory Visit: Payer: Self-pay | Admitting: Internal Medicine

## 2013-05-31 ENCOUNTER — Other Ambulatory Visit: Payer: Self-pay | Admitting: *Deleted

## 2013-05-31 ENCOUNTER — Telehealth: Payer: Self-pay | Admitting: *Deleted

## 2013-05-31 DIAGNOSIS — T8450XA Infection and inflammatory reaction due to unspecified internal joint prosthesis, initial encounter: Secondary | ICD-10-CM

## 2013-05-31 MED ORDER — AMOXICILLIN-POT CLAVULANATE 500-125 MG PO TABS: ORAL_TABLET | ORAL | Status: DC

## 2013-05-31 NOTE — Telephone Encounter (Signed)
Pt calling requesting refill of antibiotic.  Patient hasn't been seen since 09/2012.  Please advise if he should still be on Augmentin and if he should follow up with you. Andree Coss, RN

## 2013-05-31 NOTE — Telephone Encounter (Signed)
Sent refill, pt to follow up with you 6/1.

## 2013-05-31 NOTE — Telephone Encounter (Signed)
He can have a one-month supply of Augmentin. Please let him know that he needs to be seen here before any more refills are provided

## 2013-06-05 ENCOUNTER — Other Ambulatory Visit: Payer: Self-pay | Admitting: Cardiology

## 2013-06-13 ENCOUNTER — Ambulatory Visit (HOSPITAL_BASED_OUTPATIENT_CLINIC_OR_DEPARTMENT_OTHER)
Admission: RE | Admit: 2013-06-13 | Discharge: 2013-06-13 | Disposition: A | Discharge status: Home / Self Care | Payer: Self-pay | Source: Ambulatory Visit | Attending: Internal Medicine | Admitting: Internal Medicine

## 2013-06-13 ENCOUNTER — Ambulatory Visit (HOSPITAL_COMMUNITY)
Admission: RE | Admit: 2013-06-13 | Discharge: 2013-06-13 | Disposition: A | Discharge status: Home / Self Care | Payer: Self-pay | Source: Ambulatory Visit | Attending: Cardiology | Admitting: Cardiology

## 2013-06-13 VITALS — BP 160/98 | HR 68 | Wt 234.5 lb

## 2013-06-13 DIAGNOSIS — I4892 Unspecified atrial flutter: Secondary | ICD-10-CM | POA: Insufficient documentation

## 2013-06-13 DIAGNOSIS — I5022 Chronic systolic (congestive) heart failure: Secondary | ICD-10-CM

## 2013-06-13 DIAGNOSIS — I10 Essential (primary) hypertension: Secondary | ICD-10-CM | POA: Insufficient documentation

## 2013-06-13 DIAGNOSIS — I4891 Unspecified atrial fibrillation: Secondary | ICD-10-CM | POA: Insufficient documentation

## 2013-06-13 DIAGNOSIS — I359 Nonrheumatic aortic valve disorder, unspecified: Secondary | ICD-10-CM

## 2013-06-13 DIAGNOSIS — Z940 Kidney transplant status: Secondary | ICD-10-CM | POA: Insufficient documentation

## 2013-06-13 DIAGNOSIS — I509 Heart failure, unspecified: Secondary | ICD-10-CM | POA: Insufficient documentation

## 2013-06-13 MED ORDER — HYDRALAZINE HCL 100 MG PO TABS: 100.0000 mg | ORAL_TABLET | Freq: Three times a day (TID) | ORAL | Status: DC

## 2013-06-13 NOTE — Patient Instructions (Addendum)
Follow up in 3 months with Dr Shirlee Latch   Take hydralazine 100 mg three times a day  Do the following things EVERYDAY: 1) Weigh yourself in the morning before breakfast. Write it down and keep it in a log. 2) Take your medicines as prescribed 3) Eat low salt foods-Limit salt (sodium) to 2000 mg per day.  4) Stay as active as you can everyday 5) Limit all fluids for the day to less than 2 liters

## 2013-06-13 NOTE — Progress Notes (Signed)
*   Echocardiogram 2D Echocardiogram has been performed.  Hermine Messick 06/13/2013, 1:04 PM

## 2013-06-13 NOTE — Progress Notes (Signed)
Patient ID: Chad [name], male   DOB: 1955/02/22, 58 y.o.   MRN: [id number] Nephrology: Dr. Allena Katz PCP: Dr Allena Katz  Orthopedic: Dr [name]  58 yo with history of renal transplant, HCV, paroxysmal atrial flutter, and chronic systolic CHF.  He has had a complicated clinical course.  In 05/2011, he developed parainfluenza PNA that progressed to ARDS.  He had septic shock and was briefly on CVVH.  He went into atrial flutter with RVR (converted back to NSR spontaneously).  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  He went to inpatient rehab until 06/2011. In 07/2011, he was re-hospitalized with a lower leg wound infection.  Proteus and Serratia grew out.  He was admitted again in 09/2011 with acute/chronic diastolic CHF.  Repeat echo showed EF 30-35%.  Lexiscan myoview showed EF 42% with no ischemia or infarction.    06/2012 he had a distal femur fracture with ORIF.  This was complicated by a wound infection with Proteus and Enterococcus and he had a re-operation.  He remains on Augmentin and is now wheelchair-bound.    He returns for follow up. Overall he feels much better.  Denies PND, SOB, CP, dizziness. He continues to sue oxygen at night.  Lower extremity improved. Unable to weigh at home because he does not have rails to stand and weight. NWB on LLE due to fracture. Drinking < 2 liters fluid daily. Following low salt diet. Lives at home with his son. He has AHC to change lower extremity dressing to RLE.   Labs (9/13): K 4.2 => 4.6, creatinine 3.12 => 3.2, BNP 25000, TSH normal, LDL 153, HDL 40, LFTs normal Labs (78/29): K 3.3, creatinine 3.1, BUN 55 Labs (9/14): creatinine 2.82 Labs (12/14): K 4.3, creatinine 4.19, BUN 88, BNP 19699, HCT 34.1 Labs (1/15): Pro-BNP 1069, K+ 4.8, Cr 3.3, TSH 4.9, AST 20, ALT 13 Labs (02/16/13): K 4.1 Creatinine 3.5 Pro BNP 12379   ECHO 06/13/13 EF recovered per Dr [name]  55%   PMH: 1. Atrial flutter in setting of septic shock/PNA in 5/13.  2.  CKD: Renal transplant 1996. Now baseline creatinine is around 3.  3. HTN 4. HCV 5. OSA: not currently on CPAP.  6. Parainfluenza PNA with ARDS and septic shock in 5/13.  He required CVVH briefly.  Recurrent PNA in 11/13.  7. Lower leg wound infection 7/13 with Proteus and Serratia.  8. Chronic systolic CHF: Echo (5/13) with EF 15-20%, diffuse hypokinesis.  Repeat echo in (9/13) with EF 30-35%, diffuse hypokinesis worse inferoposteriorly, mild to moderate MR with mild MS, mildly dilated RV with mildly decreased systolic function, PASP 73 mmHg.  Lexiscan myoview (9/13) with EF 42%, diffuse hypokinesis, no evidence for ischemia or infarction.  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   9. H/o GI bleed 10. Distal femur fracture with ORIF 6/14 complicated by wound infection.   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   FH: No premature CAD.    ROS: All systems reviewed and negative except as per HPI.   Current Outpatient Prescriptions  Medication Sig Dispense Refill  . allopurinol (ZYLOPRIM) 100 MG tablet Take 1 tablet (100 mg total) by mouth 2 (two) times daily.  60 tablet  1  . amiodarone (PACERONE) 200 MG tablet TAKE 1 TABLET BY MOUTH EVERY DAY  30 tablet  0  . amoxicillin-clavulanate (AUGMENTIN) 500-125 MG per tablet TAKE 1 TABLET BY MOUTH TWICE A DAY  60 tablet  0  . aspirin 325 MG tablet Take 325 mg by mouth daily.      . carvedilol (COREG) 25 MG tablet TAKE 1 TABLET BY MOUTH TWICE A DAY WITH A MEAL  60 tablet  3  . furosemide (LASIX) 80 MG tablet Take 80 mg by mouth 2 (two) times daily.       . hydrALAZINE (APRESOLINE) 100 MG tablet Take 1 tablet (100 mg total) by mouth 3 (three) times daily.  90 tablet  3  . hydrALAZINE (APRESOLINE) 50 MG tablet Take 75 mg by mouth 3 (three) times daily.      . isosorbide mononitrate (IMDUR) 60 MG 24 hr tablet TAKE 1.5 TABLETS EVERY DAY  45 tablet  0  . methocarbamol (ROBAXIN) 500 MG tablet Take  500 mg by mouth daily.       . metolazone (ZAROXOLYN) 2.5 MG tablet 1 tablet (2.5mg ) thirty minutes before your morning lasix on Tues, Thurs, and Sat mornings  15 tablet  3  . pantoprazole (PROTONIX) 20 MG tablet Take 20 mg by mouth daily.      . potassium chloride SA (KLOR-CON M20) 20 MEQ tablet TAKE 2 TABLET BY MOUTH EVERY DAY      . pravastatin (PRAVACHOL) 40 MG tablet Take 1 tablet (40 mg total) by mouth daily.  30 tablet  6  . predniSONE (DELTASONE) 10 MG tablet Take 1 tablet (10 mg total) by mouth daily.  30 tablet  1  . Probiotic Product (PROBIOTIC DAILY PO) Take by mouth daily.      . sirolimus (RAPAMUNE) 1 MG/ML solution Take 1.2 mg by mouth every morning.        No current facility-administered medications for this encounter.   Filed Vitals:   06/13/13 1355  BP: 160/98  Pulse: 68  Weight: 234 lb 8 oz (106.369 kg)  SpO2: 90%    General: NAD, obese, in wheelchair Neck: JVP 5-6 cm, no thyromegaly or thyroid nodule.  Lungs: Clear to auscultation bilaterally with normal respiraorty effort. CV: Nondisplaced PMI.  Heart regular S1/S2, no S3/S4, 2/6 SEM.   No carotid bruit.    Abdomen: Soft, nontender, no hepatosplenomegaly, no distention.  Neurologic: Alert and oriented x 3.  Psych: Normal affect. Extremities: No clubbing or cyanosis. LLE compression stocking RLE unna boot. No thigh edema.   Assessment/Plan:  1. Chronic systolic CHF: EF 21-30%30-35% (11/2011)--> ECHO 03/07/13 EF 55% normalized.  No need for ICD.  ECHO 55-60% today per dr Gala RomneyBensimhon. Dr Shirlee LatchMcLean to review and determine if he need possible TEE to further assess possible plaque in in Aorta.  Prior myoview showed no ischemia or infarction. Able to stand on HF clinic scale but unable to weigh at home. Weight down another 10 pounds from last visit.  Volume status stable. Continue 160 mg lasix bid and Metolazone 2.5 mg three times a week.  - Coreg at goal dose 25 mg BID. Not candidate at this time for ACEI or spironolactone with  renal failure.  -Increase hydralazine to 100 mg tid and continue IMDUR 90 mg daily. - Reinforced the need and importance of daily weights, a low sodium diet, and fluid restriction (less than 2 L a day). Instructed to call the HF clinic if weight increases more than 3 lbs overnight or 5 lbs in a week.  2. Atrial flutter: Maintaining SR on amio. He is not on coumadin due to history of GI bleeding.  Continue amiodarone. Last LFTs and TSH good (01/2013).  He  should have a yearly eye exam.  3. OSA: Has OSA but does not have a CPAP machine. Will need to refer to pulmonary but he would like to hold off for now. Uses oxygen at night.  4. Hyperlipidemia:  Continue statin.   5. CKD: S/P transplant. Cr stable. Followed up with Dr. Allena Katz last week.  6. HTN: BP elevated. Increase hydralazine to 100 mg tid. Continue current dose of imdur, lasix, metolazone, and carvedilol.    Follow up in 3 months    Amy D Clegg NP-C 06/13/2013  Patient seen and examined with Tonye Becket, NP. We discussed all aspects of the encounter. I agree with the assessment and plan as stated above. He continues to do well. Volume status looks fine. Echo reviewed personally and EF 55-60%. There is an apparent echodensity in the aortic arch which may be plaque (vs artifact). We discussed option of proceeding with TEE however this would be unlikely to change our management and he wants to defer. I will review echo with Dr. Shirlee Latch to get his opinion. Agree with increasing hydralazine for HTN.  Daniel R Bensimhon,MD 12:15 AM

## 2013-06-15 ENCOUNTER — Other Ambulatory Visit: Payer: Self-pay | Admitting: Cardiology

## 2013-06-15 DIAGNOSIS — S72009D Fracture of unspecified part of neck of unspecified femur, subsequent encounter for closed fracture with routine healing: Secondary | ICD-10-CM

## 2013-06-15 DIAGNOSIS — T8140XA Infection following a procedure, unspecified, initial encounter: Secondary | ICD-10-CM

## 2013-06-15 DIAGNOSIS — Z9981 Dependence on supplemental oxygen: Secondary | ICD-10-CM

## 2013-06-15 DIAGNOSIS — Z4801 Encounter for change or removal of surgical wound dressing: Secondary | ICD-10-CM

## 2013-06-15 DIAGNOSIS — Z452 Encounter for adjustment and management of vascular access device: Secondary | ICD-10-CM

## 2013-06-15 DIAGNOSIS — I509 Heart failure, unspecified: Secondary | ICD-10-CM

## 2013-06-15 DIAGNOSIS — Z5181 Encounter for therapeutic drug level monitoring: Secondary | ICD-10-CM

## 2013-06-15 DIAGNOSIS — R9389 Abnormal findings on diagnostic imaging of other specified body structures: Secondary | ICD-10-CM

## 2013-06-21 ENCOUNTER — Telehealth (HOSPITAL_COMMUNITY): Payer: Self-pay | Admitting: Cardiology

## 2013-06-21 NOTE — Telephone Encounter (Signed)
Opened in error

## 2013-06-23 ENCOUNTER — Telehealth (HOSPITAL_COMMUNITY): Payer: Self-pay | Admitting: Anesthesiology

## 2013-06-23 NOTE — Telephone Encounter (Signed)
Patient called to ask if he could take Viagra. Discussed with him that he should not take viagra if he is taking Imdur. We discussed if he want to take Viagra that he can't take his Imdur that day. He reported he understood.   Aundria Rud NP-C 2:00 PM

## 2013-06-26 ENCOUNTER — Other Ambulatory Visit: Payer: Self-pay | Admitting: Cardiology

## 2013-06-27 ENCOUNTER — Ambulatory Visit (INDEPENDENT_AMBULATORY_CARE_PROVIDER_SITE_OTHER): Payer: Self-pay | Admitting: Internal Medicine

## 2013-06-27 VITALS — BP 134/80 | HR 66 | Temp 98.4°F

## 2013-06-27 DIAGNOSIS — L989 Disorder of the skin and subcutaneous tissue, unspecified: Secondary | ICD-10-CM

## 2013-06-27 DIAGNOSIS — T8149XA Infection following a procedure, other surgical site, initial encounter: Secondary | ICD-10-CM

## 2013-06-27 DIAGNOSIS — T8140XA Infection following a procedure, unspecified, initial encounter: Secondary | ICD-10-CM

## 2013-06-27 LAB — C-REACTIVE PROTEIN: CRP: 1.5 mg/dL — ABNORMAL HIGH (ref <0.60)

## 2013-06-27 NOTE — Addendum Note (Signed)
Addended by: Starleen Arms D on: 06/27/2013 03:09 PM   Modules accepted: Orders

## 2013-06-27 NOTE — Progress Notes (Signed)
Patient ID: Chad [name], male   DOB: 10/09/1955, 58 y.o.   MRN: [id number]         [facility]  Patient Active Problem List   Diagnosis Date Noted  . Skin lesion of right leg 06/27/2013    Priority: High  . Postoperative wound infection 08/25/2012    Priority: High  . Diarrhea 03/16/2012    Priority: Medium  . Femur fracture, left 07/16/2012  . Fracture, femur, distal 07/10/2012  . Nausea and vomiting 03/13/2012  . Hx of amiodarone therapy 03/13/2012  . Steroid long-term use 03/13/2012  . Pulmonary hypertension 03/13/2012  . CHF (congestive heart failure) 12/08/2011  . Community acquired pneumonia 12/08/2011  . PAF (paroxysmal atrial fibrillation) 12/08/2011  . History of renal transplant 12/08/2011  . HTN (hypertension) 12/08/2011  . Respiratory failure with hypoxia 12/08/2011  . CKD (chronic kidney disease) 11/18/2011  . Hypertension 11/18/2011  . OSA (obstructive sleep apnea) 11/13/2011  . Chronic systolic CHF (congestive heart failure) 10/22/2011  . Acute on chronic systolic heart failure 10/03/2011  . Atrial flutter 08/27/2011  . Ulcer of left lower leg 08/27/2011  . Conjunctivitis, acute, left eye 08/27/2011  . 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 Septic shock 06/10/2011  . Acute renal failure 06/10/2011  . Cardiomyopathy 06/10/2011  . Acute systolic heart failure 06/09/2011  . Renal transplant disorder 06/05/2011  . HCAP (healthcare-associated pneumonia) 06/04/2011  . Thrombocytopenia 06/04/2011  . Acute respiratory failure 06/04/2011  . ARDS (adult respiratory distress syndrome) 06/04/2011    Patient's Medications  New Prescriptions   No medications on file  Previous Medications   ALLOPURINOL (ZYLOPRIM) 100 MG TABLET    Take 1 tablet (100 mg total) by mouth 2 (two) times daily.   AMIODARONE (PACERONE) 200 MG TABLET    TAKE 1 TABLET BY MOUTH EVERY DAY   AMOXICILLIN-CLAVULANATE (AUGMENTIN) 500-125 MG PER TABLET    TAKE 1  TABLET BY MOUTH TWICE A DAY   ASPIRIN 325 MG TABLET    Take 325 mg by mouth daily.   CARVEDILOL (COREG) 25 MG TABLET    TAKE 1 TABLET TWICE DAILY WITH A MEAL   FUROSEMIDE (LASIX) 80 MG TABLET    Take 80 mg by mouth 2 (two) times daily.    HYDRALAZINE (APRESOLINE) 100 MG TABLET    Take 1 tablet (100 mg total) by mouth 3 (three) times daily.   ISOSORBIDE MONONITRATE (IMDUR) 60 MG 24 HR TABLET    TAKE 1.5 TABLETS EVERY DAY   METHOCARBAMOL (ROBAXIN) 500 MG TABLET    Take 500 mg by mouth daily.    METOLAZONE (ZAROXOLYN) 2.5 MG TABLET    1 tablet (2.5mg ) thirty minutes before your morning lasix on Tues, Thurs, and Sat mornings   PANTOPRAZOLE (PROTONIX) 20 MG TABLET    Take 20 mg by mouth daily.   POTASSIUM CHLORIDE SA (KLOR-CON M20) 20 MEQ TABLET    TAKE 2 TABLET BY MOUTH EVERY DAY   PRAVASTATIN (PRAVACHOL) 40 MG TABLET    Take 1 tablet (40 mg total) by mouth daily.   PREDNISONE (DELTASONE) 10 MG TABLET    Take 1 tablet (10 mg total) by mouth daily.   PROBIOTIC PRODUCT (PROBIOTIC DAILY PO)    Take by mouth daily.   SIROLIMUS (RAPAMUNE) 1 MG/ML SOLUTION    Take 1.2 mg by mouth every morning.   Modified Medications   No medications on file  Discontinued Medications   No medications on file    Subjective:  Chad [name] is in for his first visit with me since September of last year. He developed a postoperative wound infection after open reduction and internal fixation of a left femur fracture.. Wound cultures are superficial wound drainage grew Proteus and enterococcus. Operative cultures from incision and drainage were negative but he was already on antibiotics. He received 6 weeks of IV vancomycin and meropenem then transitioned to oral Augmentin which he has been on ever since. He tells me that recent x-rays showed that his fracture site was not fully healed. Dr. Toni Arthurs told him he would need to stay on Augmentin and could not have the plate removed yet.  He states that ever since his last surgery  he has had intermittent skin lesions on his lower extremities. He had one or 2 on his left lower leg which have now healed and most recently he has been bothered by recurrent lesions on his right lower extremity. They also occur after he has bumped the area. They will become enlarged and tender. Occasionally they will arrange some thin bloody fluid. He has not had any fever, chills or sweats.  Review of Systems: Pertinent items are noted in HPI.  Past Medical History  Diagnosis Date  . Hypertension   . Hepatitis C 1996  . CKD (chronic kidney disease), stage IV     a. s/p renal transplant 1996.  Marland Kitchen Pneumonia   . HLD (hyperlipidemia)   . Nonischemic cardiomyopathy     a. unknown etiology, EF 30-35% by echo 09/2011;  b. 09/2011 Lexi MV EF 42%, no ischemia/infarct.  . Systolic CHF   . Atrial flutter     a. in the setting of sepsis 05/2011; on amio; no anticoagulation    History  Substance Use Topics  . Smoking status: Never Smoker   . Smokeless tobacco: Never Used  . Alcohol Use: No    Family History  Problem Relation Age of Onset  . Heart disease Neg Hx     No Known Allergies  Objective: Temp: 98.4 F (36.9 C) (06/01 1414) Temp src: Oral (06/01 1414) BP: 134/80 mmHg (06/01 1414) Pulse Rate: 66 (06/01 1414)  General: He is in no distress seated in his wheelchair Skin: He has hyperpigmented and scarred lesions on both lower extremities. He has 1 acute lesion over his anterior shin distally on the right side. It is swollen and tender with central darkening. It seems slightly fluctuant. There is no surrounding cellulitis or warmth. There is no drainage. Joints and extremities: Left leg incisions are fully healed without evidence of infection  Lab Results BMET    Component Value Date/Time   NA 140 02/16/2013 1419   K 4.1 02/16/2013 1419   CL 90* 02/16/2013 1419   CO2 32 02/16/2013 1419   GLUCOSE 99 02/16/2013 1419   BUN 59* 02/16/2013 1419   CREATININE 3.57* 02/16/2013 1419    CALCIUM 9.4 02/16/2013 1419   GFRNONAA 18* 02/16/2013 1419   GFRAA 20* 02/16/2013 1419   Sed Rate (mm/hr)  Date Value  08/27/2012 75*     CRP (mg/dL)  Date Value  4/0/9811 1.8*     Assessment: I am not sure what is causing his recurrent skin lesions. His current lesion looks like it might be a simple bruise with a hematoma but it would be unusual to keep having recurrent skin lesions like this. His active lesion does not appear to be infected.  I will see if I can arrange for evaluation at the wound clinic.  I will continue Augmentin and obtain x-ray reports and office notes from Dr. [name]  Plan: 1. Continue Augmentin for now 2. Check sedimentation rate and C-reactive protein 3. The pain records from Dr. [name] 4. Wound clinic referral 5. Followup in one month   Cliffton Asters, MD [facility] [phone] pager   [phone] cell 06/27/2013, 2:38 PM

## 2013-06-28 ENCOUNTER — Other Ambulatory Visit (HOSPITAL_COMMUNITY): Payer: Self-pay | Admitting: Internal Medicine

## 2013-06-28 ENCOUNTER — Other Ambulatory Visit: Payer: Self-pay | Admitting: Cardiology

## 2013-06-28 LAB — SEDIMENTATION RATE: SED RATE: 33 mm/h — AB (ref 0–16)

## 2013-07-08 ENCOUNTER — Other Ambulatory Visit: Payer: Self-pay | Admitting: Cardiology

## 2013-07-25 ENCOUNTER — Encounter: Payer: Self-pay | Admitting: Licensed Clinical Social Worker

## 2013-07-25 ENCOUNTER — Ambulatory Visit (INDEPENDENT_AMBULATORY_CARE_PROVIDER_SITE_OTHER): Payer: Self-pay | Admitting: Internal Medicine

## 2013-07-25 ENCOUNTER — Encounter: Payer: Self-pay | Admitting: Internal Medicine

## 2013-07-25 ENCOUNTER — Telehealth: Payer: Self-pay | Admitting: *Deleted

## 2013-07-25 VITALS — BP 155/86 | HR 71 | Temp 98.3°F | Wt 246.0 lb

## 2013-07-25 DIAGNOSIS — T8140XA Infection following a procedure, unspecified, initial encounter: Secondary | ICD-10-CM

## 2013-07-25 DIAGNOSIS — T814XXD Infection following a procedure, subsequent encounter: Principal | ICD-10-CM

## 2013-07-25 DIAGNOSIS — Z5189 Encounter for other specified aftercare: Secondary | ICD-10-CM

## 2013-07-25 DIAGNOSIS — IMO0001 Reserved for inherently not codable concepts without codable children: Secondary | ICD-10-CM

## 2013-07-25 NOTE — Progress Notes (Signed)
Patient ID: Chad [name], male   DOB: 09/17/1955, 58 y.o.   MRN: [id number]         [facility]  Patient Active Problem List   Diagnosis Date Noted  . Skin lesion of right leg 06/27/2013    Priority: High  . Postoperative wound infection 08/25/2012    Priority: High  . Diarrhea 03/16/2012    Priority: Medium  . Femur fracture, left 07/16/2012  . Fracture, femur, distal 07/10/2012  . Nausea and vomiting 03/13/2012  . Hx of amiodarone therapy 03/13/2012  . Steroid long-term use 03/13/2012  . Pulmonary hypertension 03/13/2012  . CHF (congestive heart failure) 12/08/2011  . Community acquired pneumonia 12/08/2011  . PAF (paroxysmal atrial fibrillation) 12/08/2011  . History of renal transplant 12/08/2011  . HTN (hypertension) 12/08/2011  . Respiratory failure with hypoxia 12/08/2011  . CKD (chronic kidney disease) 11/18/2011  . Hypertension 11/18/2011  . OSA (obstructive sleep apnea) 11/13/2011  . Chronic systolic CHF (congestive heart failure) 10/22/2011  . Acute on chronic systolic heart failure 10/03/2011  . Atrial flutter 08/27/2011  . Ulcer of left lower leg 08/27/2011  . Conjunctivitis, acute, left eye 08/27/2011  . 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 Septic shock 06/10/2011  . Acute renal failure 06/10/2011  . Cardiomyopathy 06/10/2011  . Acute systolic heart failure 06/09/2011  . Renal transplant disorder 06/05/2011  . HCAP (healthcare-associated pneumonia) 06/04/2011  . Thrombocytopenia 06/04/2011  . Acute respiratory failure 06/04/2011  . ARDS (adult respiratory distress syndrome) 06/04/2011    Patient's Medications  New Prescriptions   No medications on file  Previous Medications   ALLOPURINOL (ZYLOPRIM) 100 MG TABLET    Take 1 tablet (100 mg total) by mouth 2 (two) times daily.   AMIODARONE (PACERONE) 200 MG TABLET    TAKE 1 TABLET BY MOUTH EVERY DAY   AMOXICILLIN-CLAVULANATE (AUGMENTIN) 500-125 MG PER TABLET    TAKE 1  TABLET BY MOUTH TWICE A DAY   ASPIRIN 325 MG TABLET    Take 325 mg by mouth daily.   CARVEDILOL (COREG) 25 MG TABLET    TAKE 1 TABLET TWICE DAILY WITH A MEAL   FUROSEMIDE (LASIX) 80 MG TABLET    Take 80 mg by mouth 2 (two) times daily.    HYDRALAZINE (APRESOLINE) 100 MG TABLET    Take 1 tablet (100 mg total) by mouth 3 (three) times daily.   ISOSORBIDE MONONITRATE (IMDUR) 60 MG 24 HR TABLET    TAKE 1.5 TABLETS EVERY DAY   METHOCARBAMOL (ROBAXIN) 500 MG TABLET    Take 500 mg by mouth daily.    METOLAZONE (ZAROXOLYN) 2.5 MG TABLET    TAKE 1 TABLET (2.5MG ) THIRTY MINUTES BEFORE YOUR MORNING LASIX ON TUES, THURS, AND SAT MORNINGS   PANTOPRAZOLE (PROTONIX) 20 MG TABLET    Take 20 mg by mouth daily.   POTASSIUM CHLORIDE SA (KLOR-CON M20) 20 MEQ TABLET    TAKE 2 TABLET BY MOUTH EVERY DAY   PRAVASTATIN (PRAVACHOL) 40 MG TABLET    Take 1 tablet (40 mg total) by mouth daily.   PREDNISONE (DELTASONE) 10 MG TABLET    Take 1 tablet (10 mg total) by mouth daily.   PROBIOTIC PRODUCT (PROBIOTIC DAILY PO)    Take by mouth daily.   SIROLIMUS (RAPAMUNE) 1 MG/ML SOLUTION    Take 1.2 mg by mouth every morning.   Modified Medications   No medications on file  Discontinued Medications   No medications on file  Subjective: Chad [name] is in for his routine followup visit. He continues on his chronic suppressive amoxicillin clavulanate for polymicrobial infection of his left distal femur following open reduction and internal fixation one year ago. He states that the antibiotics sometimes causes him some stomach upset but he has not had any nausea, vomiting or diarrhea. He has some chronic swelling of his left thigh and knee is unchanged. The sore on his right shin is improving slowly. Review of Systems: Pertinent items are noted in HPI.  Past Medical History  Diagnosis Date  . Hypertension   . Hepatitis C 1996  . CKD (chronic kidney disease), stage IV     a. s/p renal transplant 1996.  Marland Kitchen Pneumonia   . HLD  (hyperlipidemia)   . Nonischemic cardiomyopathy     a. unknown etiology, EF 30-35% by echo 09/2011;  b. 09/2011 Lexi MV EF 42%, no ischemia/infarct.  . Systolic CHF   . Atrial flutter     a. in the setting of sepsis 05/2011; on amio; no anticoagulation    History  Substance Use Topics  . Smoking status: Never Smoker   . Smokeless tobacco: Never Used  . Alcohol Use: No    Family History  Problem Relation Age of Onset  . Heart disease Neg Hx     No Known Allergies  Objective: Temp: 98.3 F (36.8 C) (06/29 1417) Temp src: Oral (06/29 1417) BP: 155/86 mmHg (06/29 1417) Pulse Rate: 71 (06/29 1417)  General: He is in no distress Left knee swollen  Sed Rate (mm/hr)  Date Value  06/27/2013 33*  08/27/2012 75*     CRP (mg/dL)  Date Value  6/0/6301 1.5*  08/27/2012 1.8*     Assessment: I will obtain the most recent x-rays and notes from Dr. Toni Arthurs to help determine how long he might need to be on his antibiotics.  Plan: 1. Continue amoxicillin clavulanate for now 2. Check lab work today 3. Followup in 2 months   Cliffton Asters, MD [facility] [phone] pager   [phone] cell 07/25/2013, 2:48 PM

## 2013-07-25 NOTE — Telephone Encounter (Signed)
Verbal order per Dr. Orvan Falconer to draw a cbc, sed rate, crp, and bmp at next lab draw given to [facility] at [facility].  [name]

## 2013-07-25 NOTE — Progress Notes (Signed)
CSW met with patient in the clinic to assist with medicaid paperwork and medical bills. Patient spoke at length about frustrations with "system" and hopeful for medicaid approval soon. CSW provided support and assistance with medicaid and will continue to be available as needed. Raquel Sarna, [place]

## 2013-08-04 ENCOUNTER — Other Ambulatory Visit: Payer: Self-pay | Admitting: Internal Medicine

## 2013-08-10 ENCOUNTER — Telehealth: Payer: Self-pay | Admitting: Licensed Clinical Social Worker

## 2013-08-10 IMAGING — CR DG CHEST 1V PORT
1 series · 1 of 1 positions shown · non-contrast
Comparison: Portable chest x-ray earlier same date 2969 hours.

CLINICAL DATA: Left internal jugular central venous catheter
placement.  Endotracheal tube repositioning.

PORTABLE CHEST - 1 VIEW/6859 5591 hours:

[view not recorded]
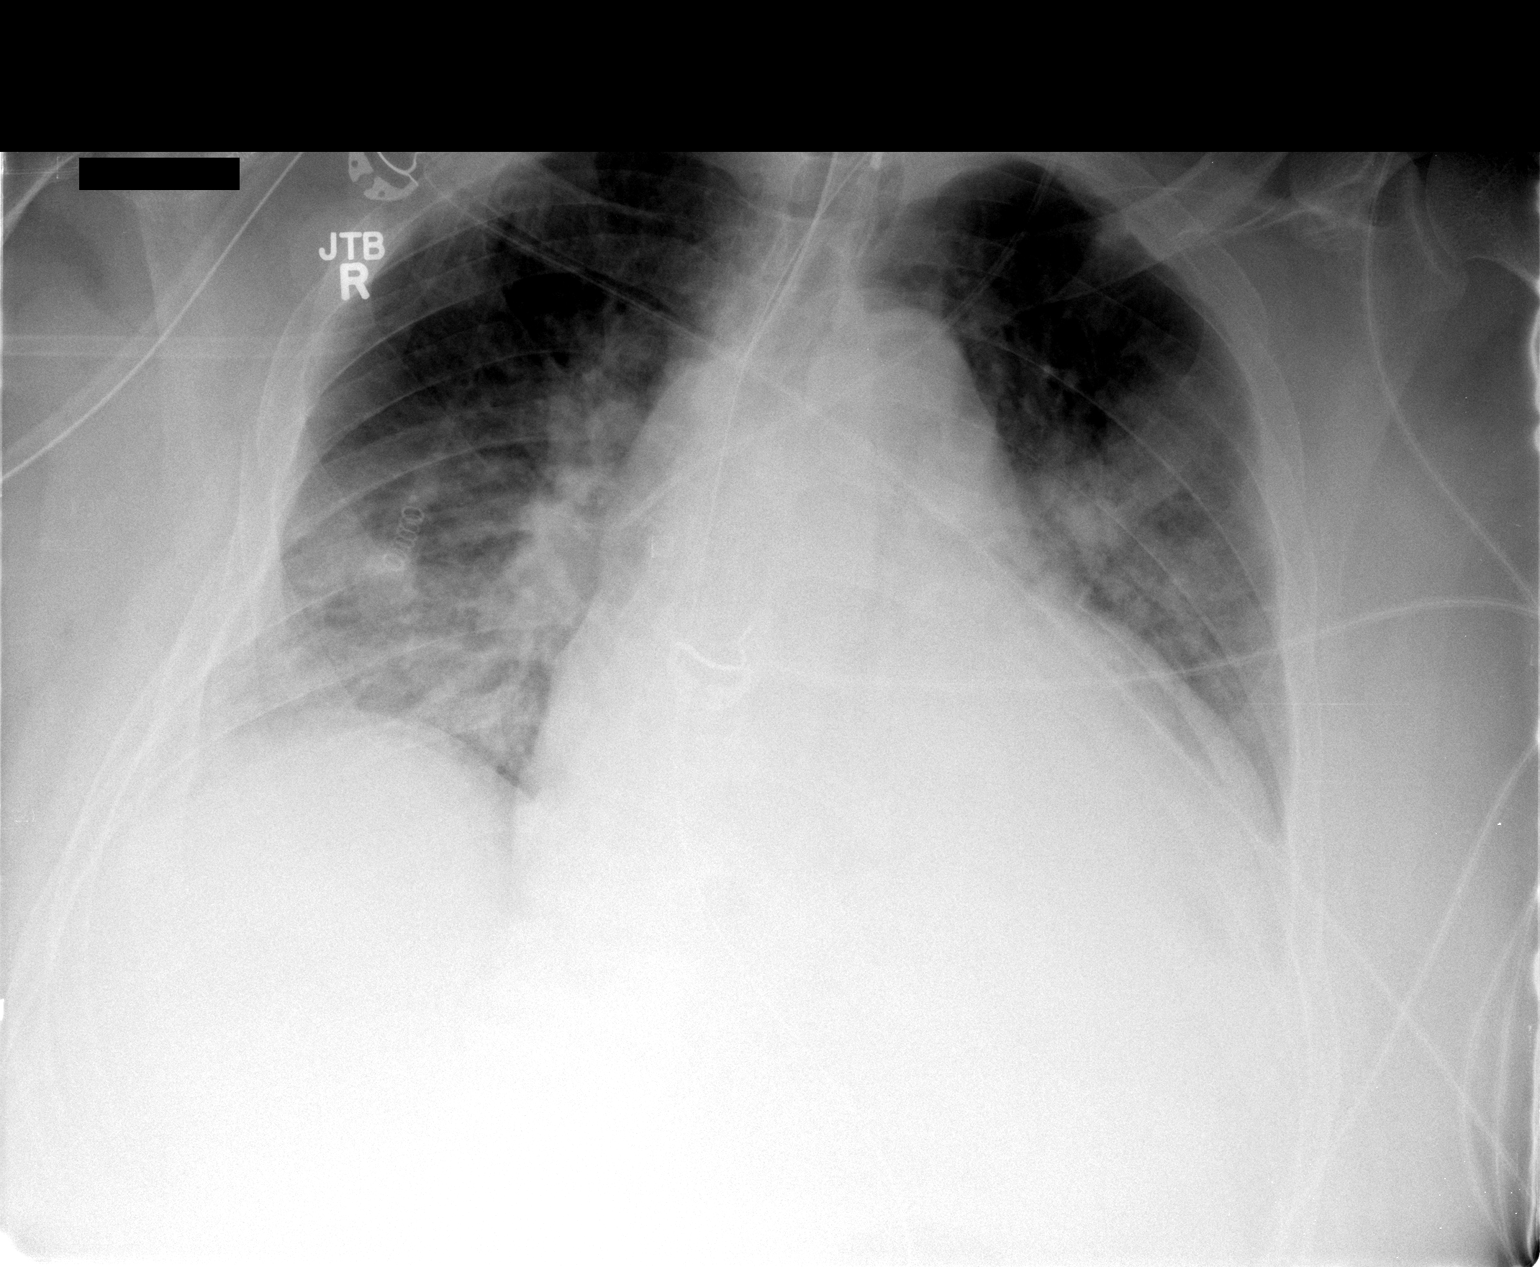

[1 of 1 positions shown; findings below may reference images not displayed]

FINDINGS: Interval left internal jugular central venous catheter
placement with its tip in the upper SVC.  No evidence of
pneumothorax or mediastinal hematoma.  Endotracheal tube withdrawn
slightly such that its tip is in satisfactory position
approximately 4-5 cm above the carina.  Nasogastric tube courses
below the diaphragm into the stomach.  Markedly suboptimal
inspiration.  Cardiac silhouette enlarged but stable.  Airspace
consolidation at the lung bases on the left upper lobe unchanged.
Pulmonary venous hypertension, unchanged.
IMPRESSION: 1.  Left internal jugular central venous catheter tip in the upper
SVC.  No acute complicating features.
2.  Endotracheal tube tip now in satisfactory position
approximately 4-5 cm above the carina.
3.  Asymmetric airspace pulmonary edema versus pneumonia, left
greater than right, as noted earlier.

## 2013-08-10 IMAGING — CR DG CHEST 1V PORT
1 series · 1 of 1 positions shown · non-contrast
Comparison: 06/03/2010.

CLINICAL DATA: Endotracheal tube placement.

PORTABLE CHEST - 1 VIEW

[view not recorded]
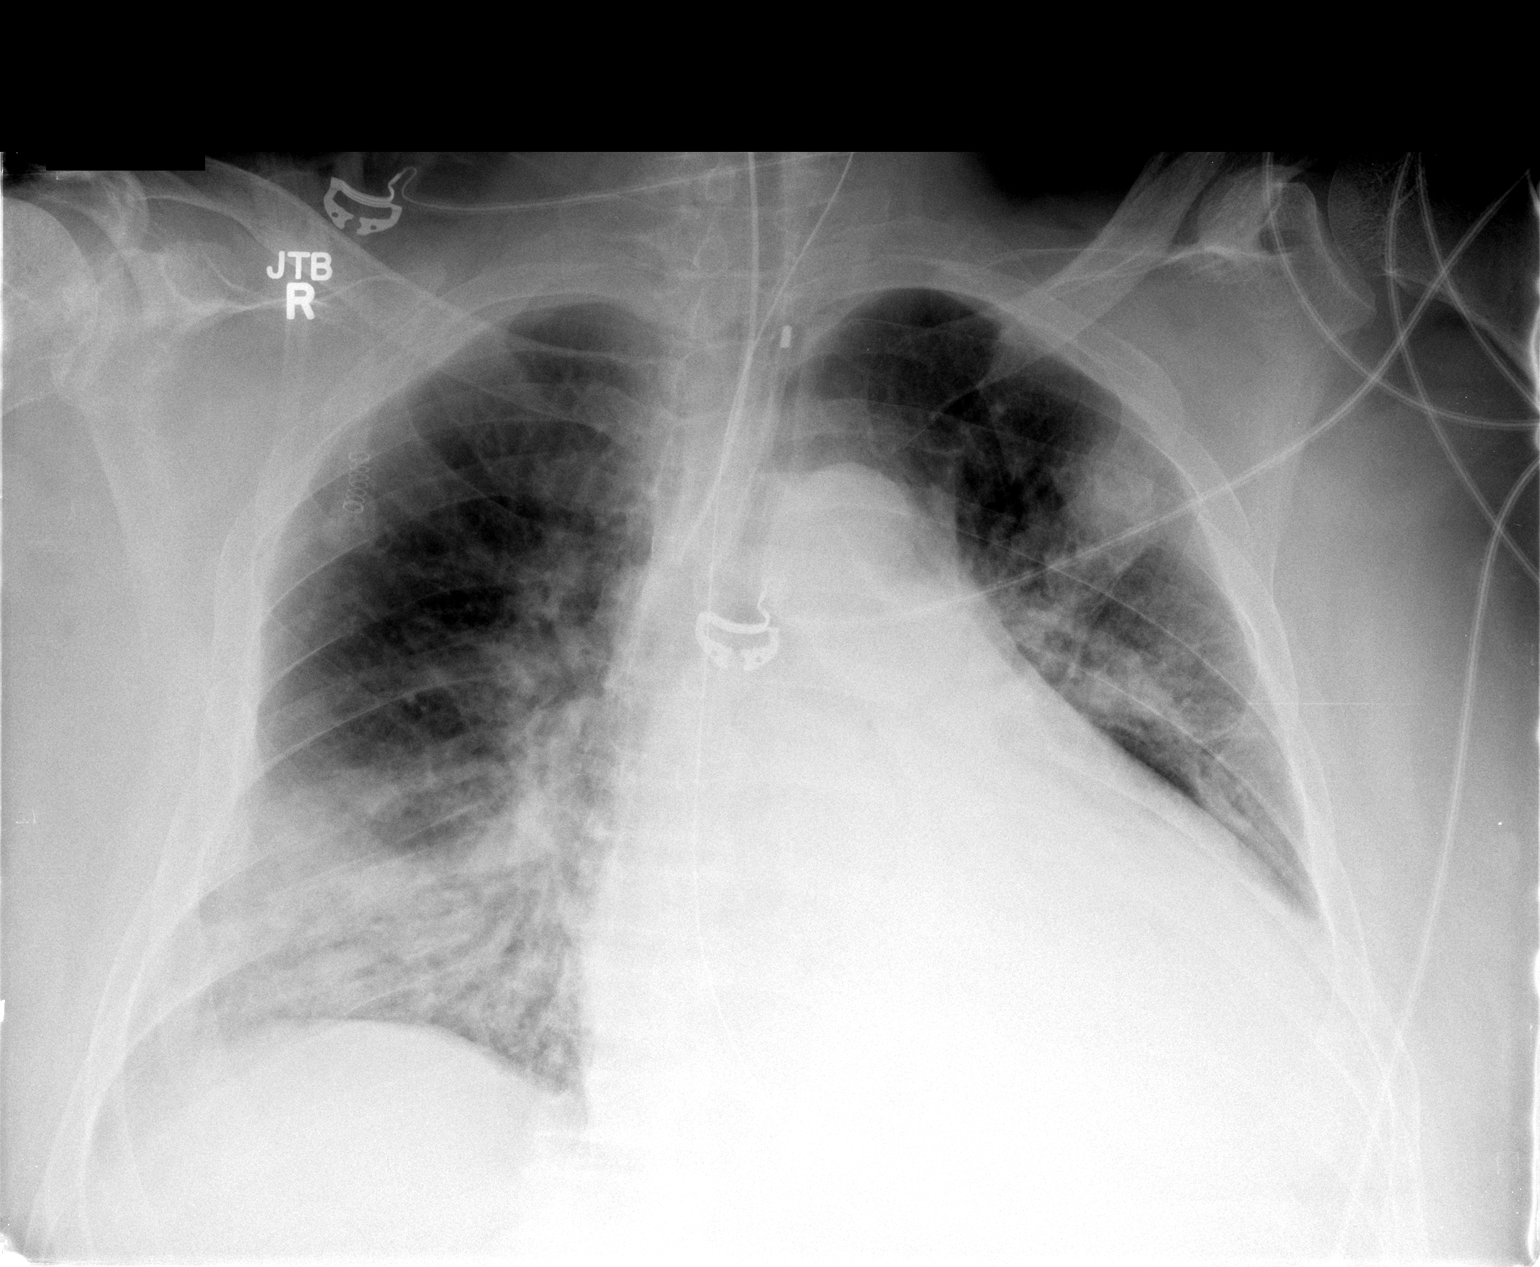

[1 of 1 positions shown; findings below may reference images not displayed]

FINDINGS: Endotracheal tube tip just above the carina.  This may be
retracted by 2 cm to avoid mainstem bronchus intubation with change
of the patient's neck position.

Nasogastric tube courses below the diaphragm.  The tip is not
included on this exam..

Interval development of consolidation lung bases greater on the
left and patchy opacification left upper lobe.  This may represent
infectious infiltrate.  Asymmetric edema also a consideration.

Cardiomegaly.

Limit evaluation of the aorta.
IMPRESSION: Endotracheal tube tip just above the carina.  This may be retracted
by 2 cm to avoid mainstem bronchus intubation with change of the
patient's neck position.

Nasogastric tube courses below the diaphragm.  The tip is not
included on this exam..

Interval development of consolidation lung bases greater on the
left and patchy opacification left upper lobe.  This may represent
infectious infiltrate.  Asymmetric edema also a consideration.

Cardiomegaly.

This has been Jeovany Patrice report utilizing dashboard call
feature.

## 2013-08-10 NOTE — Telephone Encounter (Signed)
CSW rec'd call from patient regarding his medicaid. Patient states that he rec'd another bill for over $6,000 and has not heard anything from medicaid. CSW discussed medicaid process and encouraged patient to contact medicaid worker for information on needed deductible for medicaid eligibility. CSW continues to be available. Lasandra Beech, LCSW [phone]

## 2013-08-12 DIAGNOSIS — R9389 Abnormal findings on diagnostic imaging of other specified body structures: Secondary | ICD-10-CM

## 2013-08-12 DIAGNOSIS — Z9981 Dependence on supplemental oxygen: Secondary | ICD-10-CM

## 2013-08-12 DIAGNOSIS — T8140XA Infection following a procedure, unspecified, initial encounter: Secondary | ICD-10-CM

## 2013-08-12 DIAGNOSIS — S72009D Fracture of unspecified part of neck of unspecified femur, subsequent encounter for closed fracture with routine healing: Secondary | ICD-10-CM

## 2013-08-12 DIAGNOSIS — Z452 Encounter for adjustment and management of vascular access device: Secondary | ICD-10-CM

## 2013-08-12 DIAGNOSIS — Z5181 Encounter for therapeutic drug level monitoring: Secondary | ICD-10-CM

## 2013-08-12 DIAGNOSIS — I509 Heart failure, unspecified: Secondary | ICD-10-CM

## 2013-08-12 DIAGNOSIS — Z4801 Encounter for change or removal of surgical wound dressing: Secondary | ICD-10-CM

## 2013-08-12 IMAGING — CR DG CHEST 1V PORT
1 series · 1 of 1 positions shown · non-contrast
Comparison: Yesterday

CLINICAL DATA: Respiratory difficulty

PORTABLE CHEST - 1 VIEW

[AP]
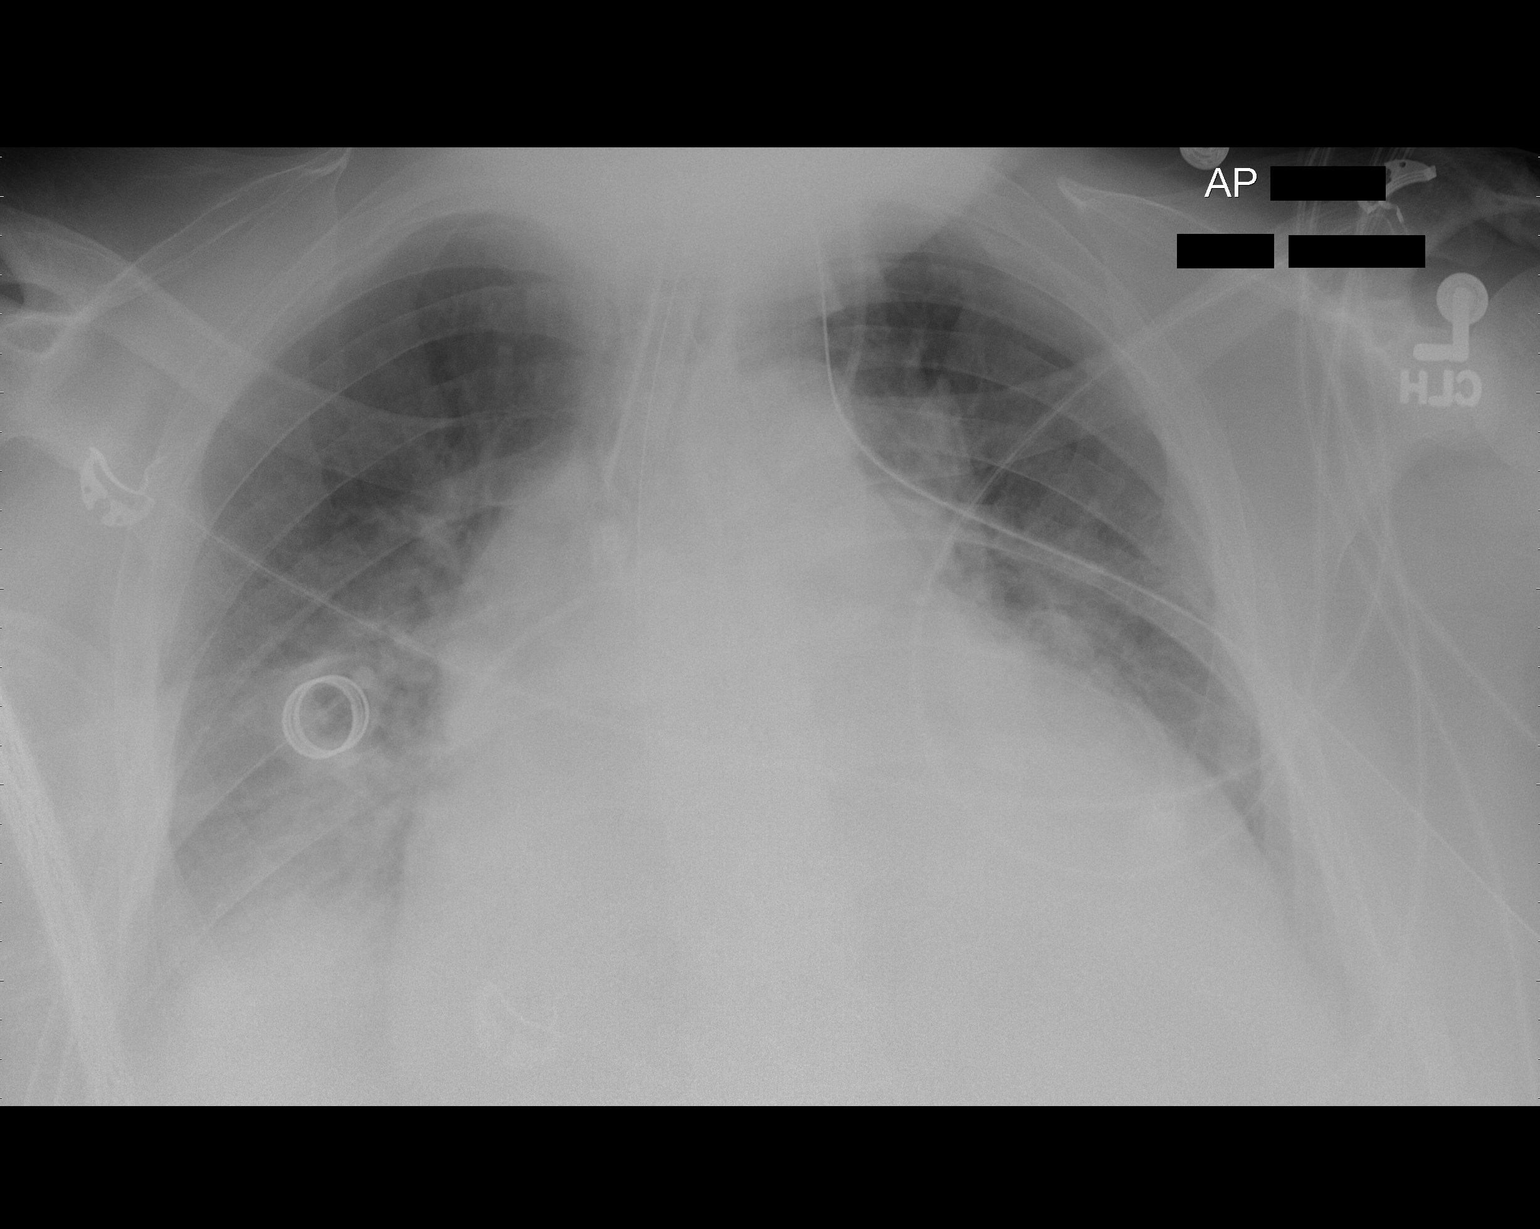

[1 of 1 positions shown; findings below may reference images not displayed]

FINDINGS: Endotracheal tube tip remains just above the carina.  NG
tube and left internal jugular center venous catheter are stable.
Severe cardiomegaly unchanged.  Diffuse edema has improved.
Persistent central basilar hazy airspace disease.  Pleural
effusions may be present and are small.
IMPRESSION: Overall improved volume overload.  Stable support structures.

## 2013-08-23 ENCOUNTER — Encounter (HOSPITAL_COMMUNITY): Payer: Self-pay | Admitting: Vascular Surgery

## 2013-09-03 ENCOUNTER — Other Ambulatory Visit: Payer: Self-pay | Admitting: Cardiology

## 2013-09-05 ENCOUNTER — Other Ambulatory Visit: Payer: Self-pay

## 2013-09-19 ENCOUNTER — Encounter (HOSPITAL_COMMUNITY): Payer: Self-pay

## 2013-09-19 ENCOUNTER — Ambulatory Visit (HOSPITAL_COMMUNITY)
Admission: RE | Admit: 2013-09-19 | Discharge: 2013-09-19 | Disposition: A | Discharge status: Home / Self Care | Payer: Self-pay | Source: Ambulatory Visit | Attending: Cardiology | Admitting: Cardiology

## 2013-09-19 VITALS — BP 152/100 | HR 70 | Wt 251.1 lb

## 2013-09-19 DIAGNOSIS — I4891 Unspecified atrial fibrillation: Secondary | ICD-10-CM

## 2013-09-19 DIAGNOSIS — Z7982 Long term (current) use of aspirin: Secondary | ICD-10-CM | POA: Insufficient documentation

## 2013-09-19 DIAGNOSIS — B1920 Unspecified viral hepatitis C without hepatic coma: Secondary | ICD-10-CM | POA: Insufficient documentation

## 2013-09-19 DIAGNOSIS — I10 Essential (primary) hypertension: Secondary | ICD-10-CM

## 2013-09-19 DIAGNOSIS — N189 Chronic kidney disease, unspecified: Secondary | ICD-10-CM | POA: Insufficient documentation

## 2013-09-19 DIAGNOSIS — I509 Heart failure, unspecified: Secondary | ICD-10-CM

## 2013-09-19 DIAGNOSIS — I5022 Chronic systolic (congestive) heart failure: Secondary | ICD-10-CM

## 2013-09-19 DIAGNOSIS — I129 Hypertensive chronic kidney disease with stage 1 through stage 4 chronic kidney disease, or unspecified chronic kidney disease: Secondary | ICD-10-CM | POA: Insufficient documentation

## 2013-09-19 DIAGNOSIS — Z940 Kidney transplant status: Secondary | ICD-10-CM | POA: Insufficient documentation

## 2013-09-19 DIAGNOSIS — I4892 Unspecified atrial flutter: Secondary | ICD-10-CM | POA: Insufficient documentation

## 2013-09-19 DIAGNOSIS — G4733 Obstructive sleep apnea (adult) (pediatric): Secondary | ICD-10-CM | POA: Insufficient documentation

## 2013-09-19 DIAGNOSIS — E785 Hyperlipidemia, unspecified: Secondary | ICD-10-CM | POA: Insufficient documentation

## 2013-09-19 DIAGNOSIS — I480 Paroxysmal atrial fibrillation: Secondary | ICD-10-CM

## 2013-09-19 DIAGNOSIS — N184 Chronic kidney disease, stage 4 (severe): Secondary | ICD-10-CM

## 2013-09-19 DIAGNOSIS — I359 Nonrheumatic aortic valve disorder, unspecified: Secondary | ICD-10-CM | POA: Insufficient documentation

## 2013-09-19 LAB — HEPATIC FUNCTION PANEL
ALT: 10 U/L (ref 0–53)
AST: 19 U/L (ref 0–37)
Albumin: 2.9 g/dL — ABNORMAL LOW (ref 3.5–5.2)
Alkaline Phosphatase: 54 U/L (ref 39–117)
BILIRUBIN TOTAL: 0.5 mg/dL (ref 0.3–1.2)
Bilirubin, Direct: <0.2 mg/dL (ref 0.0–0.3)
Total Protein: 8.1 g/dL (ref 6.0–8.3)

## 2013-09-19 LAB — BASIC METABOLIC PANEL
Anion gap: 16 — ABNORMAL HIGH (ref 5–15)
BUN: 87 mg/dL — AB (ref 6–23)
CO2: 31 mEq/L (ref 19–32)
Calcium: 9.6 mg/dL (ref 8.4–10.5)
Chloride: 93 mEq/L — ABNORMAL LOW (ref 96–112)
Creatinine, Ser: 3.95 mg/dL — ABNORMAL HIGH (ref 0.50–1.35)
GFR calc Af Amer: 18 mL/min — ABNORMAL LOW (ref >90)
GFR calc non Af Amer: 16 mL/min — ABNORMAL LOW (ref >90)
Glucose, Bld: 81 mg/dL (ref 70–99)
Potassium: 3.5 mEq/L — ABNORMAL LOW (ref 3.7–5.3)
Sodium: 140 mEq/L (ref 137–147)

## 2013-09-19 LAB — TSH: TSH: 2.62 u[IU]/mL (ref 0.350–4.500)

## 2013-09-19 MED ORDER — AMIODARONE HCL 100 MG PO TABS: ORAL_TABLET | ORAL | Status: DC

## 2013-09-19 MED ORDER — FUROSEMIDE 80 MG PO TABS: ORAL_TABLET | ORAL | Status: DC

## 2013-09-19 MED ORDER — AMLODIPINE BESYLATE 2.5 MG PO TABS: 2.5000 mg | ORAL_TABLET | Freq: Every day | ORAL | Status: DC

## 2013-09-19 NOTE — Patient Instructions (Signed)
START Amlodipine 2.5 mg daily DECREASE Amiodarone to 100  Mg daily  Labs today  Your physician recommends that you schedule a follow-up appointment in: 6 months  Do the following things EVERYDAY: 1) Weigh yourself in the morning before breakfast. Write it down and keep it in a log. 2) Take your medicines as prescribed 3) Eat low salt foods-Limit salt (sodium) to 2000 mg per day.  4) Stay as active as you can everyday 5) Limit all fluids for the day to less than 2 liters 6)

## 2013-09-19 NOTE — Progress Notes (Signed)
Patient ID: Chad [name], male   DOB: 06-Jan-1956, 58 y.o.   MRN: [id number] Nephrology: Dr. Allena Katz Orthopedic: Dr [name]  58 yo with history of renal transplant, HCV, paroxysmal atrial flutter, and chronic systolic CHF.  He has had a complicated clinical course.  In 05/2011, he developed parainfluenza PNA that progressed to ARDS.  He had septic shock and was briefly on CVVH.  He went into atrial flutter with RVR (converted back to NSR spontaneously).  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  He went to inpatient rehab until 06/2011. In 07/2011, he was re-hospitalized with a lower leg wound infection.  Proteus and Serratia grew out.  He was admitted again in 09/2011 with acute/chronic diastolic CHF.  Repeat echo showed EF 30-35%.  Lexiscan myoview showed EF 42% with no ischemia or infarction.    06/2012 he had a left distal femur fracture with ORIF.  This was complicated by a wound infection with Proteus and Enterococcus and he had a re-operation.  He remains on Augmentin and is now wheelchair-bound.    Most recently, echo in 5/15 showed EF 60% with mild AS and mild-moderate AI.  Weight is up again by 17 lbs.  BP is elevated today.  Patient has taken all his meds.  Patient continues to be very limited by his left leg.  He walks a bit in the house but is in the wheelchair most of the time.  No dyspnea (though not very active).  No chest pain, no orthopnea or PND.  No palpitations.  He is now taking Lasix 120 mg bid with metolazone three times a week.   Labs (9/13): K 4.2 => 4.6, creatinine 3.12 => 3.2, BNP 25000, TSH normal, LDL 153, HDL 40, LFTs normal Labs (40/98): K 3.3, creatinine 3.1, BUN 55 Labs (9/14): creatinine 2.82 Labs (12/14): K 4.3, creatinine 4.19, BUN 88, BNP 19699, HCT 34.1 Labs (1/15): Pro-BNP 1069, K+ 4.8, Cr 3.3, TSH 4.9, AST 20, ALT 13 Labs (02/16/13): K 4.1 Creatinine 3.5 Pro BNP 12379, LDL 124  PMH: 1. Atrial flutter in setting of septic shock/PNA in 5/13.  2.  CKD: Renal transplant 1996. Now baseline creatinine is around 3.  3. HTN 4. HCV 5. OSA: not currently on CPAP.  6. Parainfluenza PNA with ARDS and septic shock in 5/13.  He required CVVH briefly.  Recurrent PNA in 11/13.  7. Lower leg wound infection 7/13 with Proteus and Serratia.  8. Chronic systolic CHF: Echo (5/13) with EF 15-20%, diffuse hypokinesis.  Repeat echo in (9/13) with EF 30-35%, diffuse hypokinesis worse inferoposteriorly, mild to moderate MR with mild MS, mildly dilated RV with mildly decreased systolic function, PASP 73 mmHg.  Lexiscan myoview (9/13) with EF 42%, diffuse hypokinesis, no evidence for ischemia or infarction.  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  Echo (5/15) with EF 60%, mild AS, mild-moderate AI. 9. H/o GI bleed 10. Distal femur fracture with ORIF 6/14 complicated by wound infection.  11. Aortic valve disorders: Mild AS, mild-moderate AI.   SH: Lives in [place], nonsmoker, unemployed but used to sell appliances for [employer], 1 son.   FH: No premature CAD.    ROS: All systems reviewed and negative except as per HPI.   Current Outpatient Prescriptions  Medication Sig Dispense Refill  . allopurinol (ZYLOPRIM) 100 MG tablet Take 1 tablet (100 mg total) by mouth 2 (two) times daily.  60 tablet  1  . amiodarone (PACERONE) 100 MG tablet  TAKE 1 TABLET BY MOUTH EVERY DAY  30 tablet  6  . amoxicillin-clavulanate (AUGMENTIN) 500-125 MG per tablet TAKE 1 TABLET BY MOUTH TWICE A DAY  60 tablet  2  . aspirin 325 MG tablet Take 325 mg by mouth daily.      . carvedilol (COREG) 25 MG tablet TAKE 1 TABLET TWICE DAILY WITH A MEAL  60 tablet  3  . Cholecalciferol (VITAMIN D-3) 5000 UNITS TABS Take 500 mcg by mouth daily.      . furosemide (LASIX) 80 MG tablet Take one and one-half pill twice a day  90 tablet  6  . hydrALAZINE (APRESOLINE) 100 MG tablet Take 1 tablet (100 mg total) by mouth 3 (three) times daily.  90 tablet  3  . isosorbide  mononitrate (IMDUR) 60 MG 24 hr tablet TAKE 1.5 TABLETS BY MOUTH EVERY DAY  45 tablet  0  . methocarbamol (ROBAXIN) 500 MG tablet Take 500 mg by mouth daily.       . metolazone (ZAROXOLYN) 2.5 MG tablet TAKE 1 TABLET (2.5MG ) THIRTY MINUTES BEFORE YOUR MORNING LASIX ON TUES, THURS, AND SAT MORNINGS  15 tablet  3  . pantoprazole (PROTONIX) 20 MG tablet Take 20 mg by mouth daily.      . potassium chloride SA (KLOR-CON M20) 20 MEQ tablet TAKE 2 TABLET twice daily      . pravastatin (PRAVACHOL) 40 MG tablet TAKE 1 TABLET (40 MG TOTAL) BY MOUTH DAILY.  30 tablet  6  . predniSONE (DELTASONE) 10 MG tablet Take 1 tablet (10 mg total) by mouth daily.  30 tablet  1  . Probiotic Product (PROBIOTIC DAILY PO) Take by mouth daily.      . sirolimus (RAPAMUNE) 1 MG/ML solution Take 1.2 mg by mouth every morning.       . traMADol (ULTRAM) 50 MG tablet Take 50 mg by mouth 2 (two) times daily. As needed for pain      . amLODipine (NORVASC) 2.5 MG tablet Take 1 tablet (2.5 mg total) by mouth daily.  30 tablet  6   No current facility-administered medications for this encounter.   Filed Vitals:   09/19/13 1022  BP: 152/100  Pulse: 70  Weight: 251 lb 1.9 oz (113.907 kg)  SpO2: 90%    General: NAD, obese, in wheelchair Neck: JVP 5-6 cm, no thyromegaly or thyroid nodule.  Lungs: Clear to auscultation bilaterally with normal respiraorty effort. CV: Nondisplaced PMI.  Heart regular S1/S2, no S3/S4, 2/6 SEM.   No carotid bruit.    Abdomen: Soft, nontender, no hepatosplenomegaly, no distention.  Neurologic: Alert and oriented x 3.  Psych: Normal affect. Extremities: No clubbing or cyanosis. LLE compression stocking RLE unna boot. No thigh edema.   Assessment/Plan:  1. Chronic systolic CHF: EF 05-11% (11/2011) => EF 60% (5/15).  No need for ICD. Prior myoview showed no ischemia or infarction. Weight is up 17 lbs but he does not look particularly volume overloaded.  It is possible that the weight gain is more  related to inactivity.   - Continue Coreg 25 mg bid. Not candidate at this time for ACEI or spironolactone with renal failure.  - Continue current hydralazine/Imdur.  - He will continue current regimen of Lasix and metolazone.  BMET today.  - Reinforced the need and importance of daily weights, a low sodium diet, and fluid restriction (less than 2 L a day). Instructed to call the HF clinic if weight increases more than 3 lbs overnight or  5 lbs in a week.  2. Atrial flutter: Maintaining SR on amio. He is not on coumadin due to history of GI bleeding.  Given no recurrent arrhythmias, I will decrease amiodarone to 100 mg daily.  He should have a yearly eye exam.  Check LFTs/TSH.  3. OSA: Has OSA but does not have a CPAP machine. Would like him to restart CPAP.  4. Hyperlipidemia:  Continue statin.   5. CKD: S/P transplant. Creatinine has been stably elevated.  Will check BMET today.  6. HTN: BP elevated. Add amlodipine 2.5 mg daily. 7. Aortic valve disorders: Mild AS, mild to moderate AI.  Follow over time.  8. Echodensity in aortic arch: There is an apparent echodensity in the aortic arch which may be plaque (vs artifact), seen on last echo. At prior appt, discussed option of proceeding with TEE however this would be unlikely to change our management and he wanted to defer  Fransico Meadow 09/19/2013

## 2013-09-22 ENCOUNTER — Encounter (HOSPITAL_COMMUNITY): Payer: Self-pay | Admitting: Cardiology

## 2013-09-26 ENCOUNTER — Ambulatory Visit (INDEPENDENT_AMBULATORY_CARE_PROVIDER_SITE_OTHER): Payer: Self-pay | Admitting: Internal Medicine

## 2013-09-26 ENCOUNTER — Encounter: Payer: Self-pay | Admitting: Internal Medicine

## 2013-09-26 VITALS — BP 147/87 | HR 71 | Temp 98.9°F | Wt 253.8 lb

## 2013-09-26 DIAGNOSIS — Z23 Encounter for immunization: Secondary | ICD-10-CM

## 2013-09-26 DIAGNOSIS — T814XXD Infection following a procedure, subsequent encounter: Principal | ICD-10-CM

## 2013-09-26 DIAGNOSIS — IMO0001 Reserved for inherently not codable concepts without codable children: Secondary | ICD-10-CM

## 2013-09-26 DIAGNOSIS — Z5189 Encounter for other specified aftercare: Secondary | ICD-10-CM

## 2013-09-26 DIAGNOSIS — T8140XA Infection following a procedure, unspecified, initial encounter: Secondary | ICD-10-CM

## 2013-09-26 NOTE — Progress Notes (Signed)
Patient ID: Chad [name], male   DOB: [birth year], 58 y.o.   MRN: [id number]         [facility] for Infectious Disease  Patient Active Problem List   Diagnosis Date Noted  . Skin lesion of right leg 06/27/2013    Priority: High  . Postoperative wound infection 08/25/2012    Priority: High  . Diarrhea 03/16/2012    Priority: Medium  . Femur fracture, left 07/16/2012  . Fracture, femur, distal 07/10/2012  . Nausea and vomiting 03/13/2012  . Hx of amiodarone therapy 03/13/2012  . Steroid long-term use 03/13/2012  . Pulmonary hypertension 03/13/2012  . CHF (congestive heart failure) 12/08/2011  . Community acquired pneumonia 12/08/2011  . PAF (paroxysmal atrial fibrillation) 12/08/2011  . History of renal transplant 12/08/2011  . HTN (hypertension) 12/08/2011  . Respiratory failure with hypoxia 12/08/2011  . CKD (chronic kidney disease) 11/18/2011  . Hypertension 11/18/2011  . OSA (obstructive sleep apnea) 11/13/2011  . Chronic systolic CHF (congestive heart failure) 10/22/2011  . Acute on chronic systolic heart failure 10/03/2011  . Atrial flutter 08/27/2011  . Ulcer of left lower leg 08/27/2011  . Conjunctivitis, acute, left eye 08/27/2011  . 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 Septic shock 06/10/2011  . Acute renal failure 06/10/2011  . Cardiomyopathy 06/10/2011  . Acute systolic heart failure 06/09/2011  . Renal transplant disorder 06/05/2011  . HCAP (healthcare-associated pneumonia) 06/04/2011  . Thrombocytopenia 06/04/2011  . Acute respiratory failure 06/04/2011  . ARDS (adult respiratory distress syndrome) 06/04/2011    Patient's Medications  New Prescriptions   No medications on file  Previous Medications   ALLOPURINOL (ZYLOPRIM) 100 MG TABLET    Take 1 tablet (100 mg total) by mouth 2 (two) times daily.   AMIODARONE (PACERONE) 100 MG TABLET    TAKE 1 TABLET BY MOUTH EVERY DAY   AMLODIPINE (NORVASC) 2.5 MG TABLET    Take 1 tablet (2.5 mg total) by  mouth daily.   AMOXICILLIN-CLAVULANATE (AUGMENTIN) 500-125 MG PER TABLET    TAKE 1 TABLET BY MOUTH TWICE A DAY   ASPIRIN 325 MG TABLET    Take 325 mg by mouth daily.   CARVEDILOL (COREG) 25 MG TABLET    TAKE 1 TABLET TWICE DAILY WITH A MEAL   CHOLECALCIFEROL (VITAMIN D-3) 5000 UNITS TABS    Take 500 mcg by mouth daily.   FUROSEMIDE (LASIX) 80 MG TABLET    Take one and one-half pill twice a day   HYDRALAZINE (APRESOLINE) 100 MG TABLET    Take 1 tablet (100 mg total) by mouth 3 (three) times daily.   ISOSORBIDE MONONITRATE (IMDUR) 60 MG 24 HR TABLET    TAKE 1.5 TABLETS BY MOUTH EVERY DAY   METHOCARBAMOL (ROBAXIN) 500 MG TABLET    Take 500 mg by mouth daily.    METOLAZONE (ZAROXOLYN) 2.5 MG TABLET    TAKE 1 TABLET (2.5MG ) THIRTY MINUTES BEFORE YOUR MORNING LASIX ON TUES, THURS, AND SAT MORNINGS   PANTOPRAZOLE (PROTONIX) 20 MG TABLET    Take 20 mg by mouth daily.   POTASSIUM CHLORIDE SA (KLOR-CON M20) 20 MEQ TABLET    TAKE 2 TABLET twice daily   PRAVASTATIN (PRAVACHOL) 40 MG TABLET    TAKE 1 TABLET (40 MG TOTAL) BY MOUTH DAILY.   PREDNISONE (DELTASONE) 10 MG TABLET    Take 1 tablet (10 mg total) by mouth daily.   PROBIOTIC PRODUCT (PROBIOTIC DAILY PO)    Take by mouth daily.   SIROLIMUS (RAPAMUNE) 1  MG/ML SOLUTION    Take 1.2 mg by mouth every morning.    TRAMADOL (ULTRAM) 50 MG TABLET    Take 50 mg by mouth 2 (two) times daily. As needed for pain  Modified Medications   No medications on file  Discontinued Medications   No medications on file    Subjective: Chad [name] is in for his routine visit. He continues to take Augmentin without any difficulty. He continues to have some mild swelling of his left knee with occasional pain laterally. He feels like he is doing a little bit better. He is due to see Dr. [name], his orthopedic surgeon, in 2 weeks.  Review of Systems: Pertinent items are noted in HPI.  Past Medical History  Diagnosis Date  . Hypertension   . Hepatitis C 1996  . CKD  (chronic kidney disease), stage IV     a. s/p renal transplant 1996.  Marland Kitchen Pneumonia   . HLD (hyperlipidemia)   . Nonischemic cardiomyopathy     a. unknown etiology, EF 30-35% by echo 09/2011;  b. 09/2011 Lexi MV EF 42%, no ischemia/infarct.  . Systolic CHF   . Atrial flutter     a. in the setting of sepsis 05/2011; on amio; no anticoagulation    History  Substance Use Topics  . Smoking status: Never Smoker   . Smokeless tobacco: Never Used  . Alcohol Use: No    Family History  Problem Relation Age of Onset  . Heart disease Neg Hx     No Known Allergies  Objective: Temp: 98.9 F (37.2 C) (08/31 1436) Temp src: Oral (08/31 1436) BP: 147/87 mmHg (08/31 1436) Pulse Rate: 71 (08/31 1436)  General: He is seated in his wheelchair. He is in good spirits Left leg: He has some mild diffuse swelling of his knee that is unchanged. There is no unusual warmth or redness. There is some pain with palpation laterally  Sed Rate (mm/hr)  Date Value  06/27/2013 33*  08/27/2012 75*     CRP (mg/dL)  Date Value  6/0/7371 1.5*  08/27/2012 1.8*      Assessment: He seems to be improving very slowly on chronic suppressive antibiotic therapy for polymicrobial wound infection and probable osteomyelitis overlying his fixation plate at the site of his distal femur fracture. As of my last report I understood that his fracture site had not completely healed. I will continue Augmentin for now and repeat his inflammatory markers.  Plan: 1. Continue Augmentin 2. Repeat sedimentation rate and C-reactive protein 3. Followup in 6 weeks   Cliffton Asters, MD [facility] Infectious Disease [facility] [phone] pager   [phone] cell 09/26/2013, 2:45 PM

## 2013-10-10 ENCOUNTER — Other Ambulatory Visit (INDEPENDENT_AMBULATORY_CARE_PROVIDER_SITE_OTHER): Payer: Self-pay

## 2013-10-10 DIAGNOSIS — T8140XA Infection following a procedure, unspecified, initial encounter: Secondary | ICD-10-CM

## 2013-10-10 LAB — C-REACTIVE PROTEIN: CRP: 1.3 mg/dL — ABNORMAL HIGH (ref <0.60)

## 2013-10-10 LAB — SEDIMENTATION RATE: Sed Rate: 40 mm/hr — ABNORMAL HIGH (ref 0–16)

## 2013-10-12 ENCOUNTER — Other Ambulatory Visit: Payer: Self-pay | Admitting: Cardiology

## 2013-10-12 ENCOUNTER — Other Ambulatory Visit (HOSPITAL_COMMUNITY): Payer: Self-pay | Admitting: Adult Health

## 2013-10-29 ENCOUNTER — Other Ambulatory Visit: Payer: Self-pay | Admitting: Cardiology

## 2013-10-30 ENCOUNTER — Other Ambulatory Visit (HOSPITAL_COMMUNITY): Payer: Self-pay | Admitting: Cardiology

## 2013-11-07 DIAGNOSIS — K6811 Postprocedural retroperitoneal abscess: Secondary | ICD-10-CM

## 2013-11-07 DIAGNOSIS — Z5181 Encounter for therapeutic drug level monitoring: Secondary | ICD-10-CM

## 2013-11-07 DIAGNOSIS — Z452 Encounter for adjustment and management of vascular access device: Secondary | ICD-10-CM

## 2013-11-07 DIAGNOSIS — R934 Abnormal findings on diagnostic imaging of urinary organs: Secondary | ICD-10-CM

## 2013-11-07 DIAGNOSIS — I509 Heart failure, unspecified: Secondary | ICD-10-CM

## 2013-11-21 ENCOUNTER — Ambulatory Visit (INDEPENDENT_AMBULATORY_CARE_PROVIDER_SITE_OTHER): Payer: Self-pay | Admitting: Internal Medicine

## 2013-11-21 ENCOUNTER — Encounter: Payer: Self-pay | Admitting: Internal Medicine

## 2013-11-21 VITALS — BP 164/87 | HR 69 | Temp 98.0°F

## 2013-11-21 DIAGNOSIS — IMO0001 Reserved for inherently not codable concepts without codable children: Secondary | ICD-10-CM

## 2013-11-21 DIAGNOSIS — T814XXD Infection following a procedure, subsequent encounter: Secondary | ICD-10-CM

## 2013-11-21 NOTE — Progress Notes (Signed)
Patient ID: Chad [name], male   DOB: 03-07-1955, 58 y.o.   MRN: [id number]         [facility] for Infectious Disease  Patient Active Problem List   Diagnosis Date Noted  . Skin lesion of right leg 06/27/2013    Priority: High  . Postoperative wound infection 08/25/2012    Priority: High  . Diarrhea 03/16/2012    Priority: Medium  . Femur fracture, left 07/16/2012  . Fracture, femur, distal 07/10/2012  . Nausea and vomiting 03/13/2012  . Hx of amiodarone therapy 03/13/2012  . Steroid long-term use 03/13/2012  . Pulmonary hypertension 03/13/2012  . CHF (congestive heart failure) 12/08/2011  . Community acquired pneumonia 12/08/2011  . PAF (paroxysmal atrial fibrillation) 12/08/2011  . History of renal transplant 12/08/2011  . HTN (hypertension) 12/08/2011  . Respiratory failure with hypoxia 12/08/2011  . CKD (chronic kidney disease) 11/18/2011  . Hypertension 11/18/2011  . OSA (obstructive sleep apnea) 11/13/2011  . Chronic systolic CHF (congestive heart failure) 10/22/2011  . Acute on chronic systolic heart failure 10/03/2011  . Atrial flutter 08/27/2011  . Ulcer of left lower leg 08/27/2011  . Conjunctivitis, acute, left eye 08/27/2011  . 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 Septic shock 06/10/2011  . Acute renal failure 06/10/2011  . Cardiomyopathy 06/10/2011  . Acute systolic heart failure 06/09/2011  . Renal transplant disorder 06/05/2011  . HCAP (healthcare-associated pneumonia) 06/04/2011  . Thrombocytopenia 06/04/2011  . Acute respiratory failure 06/04/2011  . ARDS (adult respiratory distress syndrome) 06/04/2011    Patient's Medications  New Prescriptions   No medications on file  Previous Medications   ALLOPURINOL (ZYLOPRIM) 100 MG TABLET    Take 1 tablet (100 mg total) by mouth 2 (two) times daily.   AMIODARONE (PACERONE) 100 MG TABLET    TAKE 1 TABLET BY MOUTH EVERY DAY   AMLODIPINE (NORVASC) 2.5 MG TABLET    Take 1 tablet (2.5 mg total) by  mouth daily.   AMOXICILLIN-CLAVULANATE (AUGMENTIN) 500-125 MG PER TABLET    TAKE 1 TABLET BY MOUTH TWICE A DAY   ASPIRIN 325 MG TABLET    Take 325 mg by mouth daily.   CAMPHOR-MENTHOL (SARNA) LOTION    Apply topically.   CARVEDILOL (COREG) 25 MG TABLET    TAKE 1 TABLET TWICE DAILY WITH A MEAL   CHOLECALCIFEROL (VITAMIN D-3) 5000 UNITS TABS    Take 500 mcg by mouth daily.   FUROSEMIDE (LASIX) 80 MG TABLET    Take one and one-half pill twice a day   HYDRALAZINE (APRESOLINE) 100 MG TABLET    TAKE 1 TABLET (100 MG TOTAL) BY MOUTH 3 (THREE) TIMES DAILY.   ISOSORBIDE MONONITRATE (IMDUR) 60 MG 24 HR TABLET    TAKE 1.5 TABLETS BY MOUTH EVERY DAY   METHOCARBAMOL (ROBAXIN) 500 MG TABLET    Take 500 mg by mouth daily.    METOLAZONE (ZAROXOLYN) 2.5 MG TABLET    TAKE 1 TABLET (2.5MG ) THIRTY MINUTES BEFORE YOUR MORNING LASIX ON TUES, THURS, AND SAT MORNINGS   PANTOPRAZOLE (PROTONIX) 20 MG TABLET    Take 20 mg by mouth daily.   POTASSIUM CHLORIDE SA (KLOR-CON M20) 20 MEQ TABLET    TAKE 2 TABLET twice daily   PRAVASTATIN (PRAVACHOL) 40 MG TABLET    TAKE 1 TABLET (40 MG TOTAL) BY MOUTH DAILY.   PREDNISONE (DELTASONE) 10 MG TABLET    Take 1 tablet (10 mg total) by mouth daily.   PROBIOTIC PRODUCT (PROBIOTIC DAILY PO)  Take by mouth daily.   SIROLIMUS (RAPAMUNE) 1 MG/ML SOLUTION    Take 1.2 mg by mouth every morning.    TRAMADOL (ULTRAM) 50 MG TABLET    Take 50 mg by mouth 2 (two) times daily. As needed for pain  Modified Medications   No medications on file  Discontinued Medications   No medications on file    Subjective: Chad [name] is in for his routine visit. He continues to take chronic amoxicillin clavulanate for his polymicrobial infection at the site of his distal left femur fracture. He has now been on antibiotic therapy for nearly a year and a half. He tells me that he saw Dr. Toni ArthursJohn [name] recently and was told at the fracture site is healing. He has been referred to a "trauma specialist" to get a  second opinion about removing his hardware. It sounds like the plan is to consider hospitalization and surgery after he sees the specialist sometime next month. He has a little bit of bloating and stomach upset which he thinks may be due to the Augmentin. On rare occasions he will have some loose stool but does not feel like he is having diarrhea. Review of Systems: Pertinent items are noted in HPI.  Past Medical History  Diagnosis Date  . Hypertension   . Hepatitis C 1996  . CKD (chronic kidney disease), stage IV     a. s/p renal transplant 1996.  Marland Kitchen. Pneumonia   . HLD (hyperlipidemia)   . Nonischemic cardiomyopathy     a. unknown etiology, EF 30-35% by echo 09/2011;  b. 09/2011 Lexi MV EF 42%, no ischemia/infarct.  . Systolic CHF   . Atrial flutter     a. in the setting of sepsis 05/2011; on amio; no anticoagulation    History  Substance Use Topics  . Smoking status: Never Smoker   . Smokeless tobacco: Never Used  . Alcohol Use: No    Family History  Problem Relation Age of Onset  . Heart disease Neg Hx     No Known Allergies  Objective: Temp: 98 F (36.7 C) (10/26 1451) Temp Source: Oral (10/26 1451) BP: 164/87 mmHg (10/26 1451) Pulse Rate: 69 (10/26 1451)  General: He is seated in his wheelchair. He is in good spirits Left leg: He has some mild diffuse swelling of his knee that is unchanged. There is no unusual warmth or redness. There is some pain with palpation laterally. He has crepitus laterally with range of motion.  Sed Rate (mm/hr)  Date Value  10/10/2013 40*  06/27/2013 33*  08/27/2012 75*     CRP (mg/dL)  Date Value  9/60/45409/14/2015 1.3*  06/27/2013 1.5*  08/27/2012 1.8*      Assessment: He seems to be improving very slowly on chronic suppressive antibiotic therapy for polymicrobial wound infection and probable osteomyelitis overlying his fixation plate at the site of his distal femur fracture. If he ends up having surgery and hardware removal we should be able to  make a better assessment about whether or not he needs to stay on chronic antibiotic therapy. Hopefully he will be able to safely stop amoxicillin clavulanate soon.  Plan: 1. Continue Augmentin 2. Followup in the hospital if he undergoes repeat surgery otherwise I will see him back in 2 months   Cliffton AstersJohn Hortencia Martire, MD [facility] [phone] pager   [phone] cell 11/21/2013, 2:54 PM

## 2013-12-28 ENCOUNTER — Other Ambulatory Visit: Payer: Self-pay | Admitting: Orthopedic Surgery

## 2013-12-28 ENCOUNTER — Ambulatory Visit
Admission: RE | Admit: 2013-12-28 | Discharge: 2013-12-28 | Disposition: A | Discharge status: Home / Self Care | Payer: Self-pay | Source: Ambulatory Visit | Attending: Orthopedic Surgery | Admitting: Orthopedic Surgery

## 2013-12-28 DIAGNOSIS — S72452K Displaced supracondylar fracture without intracondylar extension of lower end of left femur, subsequent encounter for closed fracture with nonunion: Secondary | ICD-10-CM

## 2014-01-22 ENCOUNTER — Other Ambulatory Visit: Payer: Self-pay | Admitting: Cardiology

## 2014-01-23 NOTE — Telephone Encounter (Signed)
Rx(s) sent to pharmacy electronically.  

## 2014-02-02 ENCOUNTER — Other Ambulatory Visit: Payer: Self-pay | Admitting: Cardiology

## 2014-02-14 ENCOUNTER — Other Ambulatory Visit (HOSPITAL_COMMUNITY): Payer: Self-pay | Admitting: Adult Health

## 2014-02-14 ENCOUNTER — Other Ambulatory Visit (HOSPITAL_COMMUNITY): Payer: Self-pay | Admitting: Cardiology

## 2014-02-14 MED ORDER — CARVEDILOL 25 MG PO TABS: ORAL_TABLET | ORAL | Status: DC

## 2014-02-21 ENCOUNTER — Telehealth: Payer: Self-pay | Admitting: Cardiology

## 2014-02-21 NOTE — Telephone Encounter (Signed)
New Msg      Request for surgical clearance:  1. What type of surgery is being performed? Non union repair of left femur   2. When is this surgery scheduled? Pending clearance    3. Are there any medications that need to be held prior to surgery and how long? Dr. Laury Axon to know if meds need to be held.   4. Name of physician performing surgery?  Dr. Myrene Galas  5. What is your office phone and fax number? Office phn [phone] fax [phone]   Please contact Gwen at the office with this info.

## 2014-02-23 NOTE — Telephone Encounter (Signed)
OK to hold aspirin for surgery.

## 2014-02-23 NOTE — Telephone Encounter (Signed)
Just received this message this am as I have been out of the office sick, will send to Dr Shirlee Latch to address

## 2014-02-23 NOTE — Telephone Encounter (Signed)
Note faxed.

## 2014-02-24 ENCOUNTER — Telehealth (HOSPITAL_COMMUNITY): Payer: Self-pay | Admitting: Vascular Surgery

## 2014-02-26 ENCOUNTER — Other Ambulatory Visit (HOSPITAL_COMMUNITY): Payer: Self-pay | Admitting: Adult Health

## 2014-02-26 ENCOUNTER — Other Ambulatory Visit: Payer: Self-pay | Admitting: Internal Medicine

## 2014-02-27 ENCOUNTER — Telehealth: Payer: Self-pay | Admitting: *Deleted

## 2014-02-27 ENCOUNTER — Other Ambulatory Visit (HOSPITAL_COMMUNITY): Payer: Self-pay

## 2014-02-27 MED ORDER — CARVEDILOL 25 MG PO TABS
ORAL_TABLET | ORAL | Status: DC
Start: 2014-02-27 — End: 2014-06-19

## 2014-02-27 NOTE — Telephone Encounter (Signed)
He can have 1 refill. Asked him to schedule a visit with me within one month.

## 2014-02-27 NOTE — Telephone Encounter (Signed)
Patient requesting refill of augmentin.  Pt was supposed to follow up 12/2013 to see if he should stop the antibiotic, has not followed up. Please advise. Andree Coss, RN

## 2014-02-28 ENCOUNTER — Other Ambulatory Visit (HOSPITAL_COMMUNITY): Payer: Self-pay | Admitting: Cardiology

## 2014-02-28 NOTE — Telephone Encounter (Signed)
Open in error

## 2014-02-28 NOTE — Telephone Encounter (Signed)
Left message for patient with this information. 

## 2014-03-01 ENCOUNTER — Other Ambulatory Visit (HOSPITAL_COMMUNITY): Payer: Self-pay | Admitting: Adult Health

## 2014-03-02 ENCOUNTER — Other Ambulatory Visit (HOSPITAL_COMMUNITY): Payer: Self-pay | Admitting: *Deleted

## 2014-03-02 MED ORDER — ISOSORBIDE MONONITRATE ER 60 MG PO TB24: ORAL_TABLET | ORAL | Status: DC

## 2014-03-06 ENCOUNTER — Encounter (HOSPITAL_COMMUNITY): Payer: Self-pay

## 2014-03-06 ENCOUNTER — Encounter (HOSPITAL_COMMUNITY)
Admission: RE | Admit: 2014-03-06 | Discharge: 2014-03-06 | Disposition: A | Discharge status: Home / Self Care | Payer: Medicare HMO | Source: Ambulatory Visit | Attending: Orthopedic Surgery | Admitting: Orthopedic Surgery

## 2014-03-06 ENCOUNTER — Other Ambulatory Visit: Payer: Self-pay

## 2014-03-06 ENCOUNTER — Ambulatory Visit (HOSPITAL_COMMUNITY)
Admission: RE | Admit: 2014-03-06 | Discharge: 2014-03-06 | Disposition: A | Discharge status: Home / Self Care | Payer: Medicare HMO | Source: Ambulatory Visit | Attending: Orthopedic Surgery | Admitting: Orthopedic Surgery

## 2014-03-06 DIAGNOSIS — Z01818 Encounter for other preprocedural examination: Secondary | ICD-10-CM | POA: Diagnosis not present

## 2014-03-06 DIAGNOSIS — I429 Cardiomyopathy, unspecified: Secondary | ICD-10-CM | POA: Insufficient documentation

## 2014-03-06 HISTORY — DX: Gout, unspecified: M10.9

## 2014-03-06 HISTORY — DX: Pain in unspecified joint: M25.50

## 2014-03-06 HISTORY — DX: Reserved for inherently not codable concepts without codable children: IMO0001

## 2014-03-06 HISTORY — DX: Personal history of colonic polyps: Z86.010

## 2014-03-06 HISTORY — DX: Personal history of urinary calculi: Z87.442

## 2014-03-06 HISTORY — DX: Personal history of other diseases of the respiratory system: Z87.09

## 2014-03-06 HISTORY — DX: Personal history of other infectious and parasitic diseases: Z86.19

## 2014-03-06 HISTORY — DX: Frequency of micturition: R35.0

## 2014-03-06 HISTORY — DX: Gastro-esophageal reflux disease without esophagitis: K21.9

## 2014-03-06 HISTORY — DX: Personal history of colon polyps, unspecified: Z86.0100

## 2014-03-06 HISTORY — DX: Sciatica, unspecified side: M54.30

## 2014-03-06 LAB — CBC WITH DIFFERENTIAL/PLATELET
Basophils Absolute: 0.1 10*3/uL (ref 0.0–0.1)
Basophils Relative: 1 % (ref 0–1)
EOS PCT: 25 % — AB (ref 0–5)
Eosinophils Absolute: 2 10*3/uL — ABNORMAL HIGH (ref 0.0–0.7)
HCT: 40.1 % (ref 39.0–52.0)
HEMOGLOBIN: 13.7 g/dL (ref 13.0–17.0)
LYMPHS PCT: 28 % (ref 12–46)
Lymphs Abs: 2.3 10*3/uL (ref 0.7–4.0)
MCH: 29.7 pg (ref 26.0–34.0)
MCHC: 34.2 g/dL (ref 30.0–36.0)
MCV: 86.8 fL (ref 78.0–100.0)
Monocytes Absolute: 0.4 10*3/uL (ref 0.1–1.0)
Monocytes Relative: 6 % (ref 3–12)
NEUTROS ABS: 3.3 10*3/uL (ref 1.7–7.7)
Neutrophils Relative %: 40 % — ABNORMAL LOW (ref 43–77)
Platelets: 274 10*3/uL (ref 150–400)
RBC: 4.62 MIL/uL (ref 4.22–5.81)
RDW: 11.9 % (ref 11.5–15.5)
WBC: 8.1 10*3/uL (ref 4.0–10.5)

## 2014-03-06 LAB — URINALYSIS, ROUTINE W REFLEX MICROSCOPIC
BILIRUBIN URINE: NEGATIVE
Glucose, UA: NEGATIVE mg/dL
Hgb urine dipstick: NEGATIVE
KETONES UR: NEGATIVE mg/dL
Leukocytes, UA: NEGATIVE
Nitrite: NEGATIVE
PH: 5 (ref 5.0–8.0)
Protein, ur: 100 mg/dL — AB
SPECIFIC GRAVITY, URINE: 1.014 (ref 1.005–1.030)
Urobilinogen, UA: 0.2 mg/dL (ref 0.0–1.0)

## 2014-03-06 LAB — COMPREHENSIVE METABOLIC PANEL
ALT: 14 U/L (ref 0–53)
AST: 27 U/L (ref 0–37)
Albumin: 3.3 g/dL — ABNORMAL LOW (ref 3.5–5.2)
Alkaline Phosphatase: 52 U/L (ref 39–117)
Anion gap: 17 — ABNORMAL HIGH (ref 5–15)
BILIRUBIN TOTAL: 0.6 mg/dL (ref 0.3–1.2)
BUN: 92 mg/dL — AB (ref 6–23)
CO2: 26 mmol/L (ref 19–32)
Calcium: 10 mg/dL (ref 8.4–10.5)
Chloride: 94 mmol/L — ABNORMAL LOW (ref 96–112)
Creatinine, Ser: 4.71 mg/dL — ABNORMAL HIGH (ref 0.50–1.35)
GFR calc Af Amer: 15 mL/min — ABNORMAL LOW (ref >90)
GFR calc non Af Amer: 13 mL/min — ABNORMAL LOW (ref >90)
Glucose, Bld: 104 mg/dL — ABNORMAL HIGH (ref 70–99)
POTASSIUM: 4.3 mmol/L (ref 3.5–5.1)
SODIUM: 137 mmol/L (ref 135–145)
TOTAL PROTEIN: 9 g/dL — AB (ref 6.0–8.3)

## 2014-03-06 LAB — SEDIMENTATION RATE: Sed Rate: 57 mm/hr — ABNORMAL HIGH (ref 0–16)

## 2014-03-06 LAB — URINE MICROSCOPIC-ADD ON

## 2014-03-06 LAB — APTT: aPTT: 29 seconds (ref 24–37)

## 2014-03-06 LAB — PROTIME-INR
INR: 1.05 (ref 0.00–1.49)
Prothrombin Time: 13.8 seconds (ref 11.6–15.2)

## 2014-03-06 MED ORDER — LACTATED RINGERS IV SOLN: INTRAVENOUS | Status: DC

## 2014-03-06 MED ORDER — CHLORHEXIDINE GLUCONATE 4 % EX LIQD: 60.0000 mL | Freq: Once | CUTANEOUS | Status: DC

## 2014-03-06 NOTE — Progress Notes (Addendum)
Medical Md is Dr.Richard Caryn Section  Echo reports in epic from 2011/2013/2015  Stress test in epic from 2013  Cardiologist is Dr.McLean  Denies ever having a heart cath

## 2014-03-06 NOTE — Pre-Procedure Instructions (Signed)
Chad [name]  03/06/2014   Your procedure is scheduled on:  Tues, Feb 9 @ 8:00 AM  Report to [facility]  at 6:00 AM.  Call this number if you have problems the morning of surgery: [phone]   Remember:   Do not eat food or drink liquids after midnight.   Take these medicines the morning of surgery with A SIP OF WATER: Allopurinol(Zyloprim),Amiodarone(Pacerone),Amlodipine(Norvasc),Augmentin(Amoxicillin),Carvedilol(Coreg),Hydralazine(Apresoline),Isosorbide(Imdur),Protonix(Pantoprazole),Prednisone(Deltasone),Tramadol(Ultram),and Rapamune(Sirolimus)               Hold your Aspirin as instructed by Dr.McLean. No Goody's,BC's,Aleve,Ibuprofen,Fish Oil,or any Herbal Medications.   Do not wear jewelry  Do not wear lotions, powders, or colognes. You may wear deodorant.  Men may shave face and neck.  Do not bring valuables to the hospital.  [facility] is not responsible                  for any belongings or valuables.               Contacts, dentures or bridgework may not be worn into surgery.  Leave suitcase in the car. After surgery it may be brought to your room.  For patients admitted to the hospital, discharge time is determined by your                treatment team.               Patients discharged the day of surgery will not be allowed to drive  home.    Special Instructions:   - Preparing for Surgery  Before surgery, you can play an important role.  Because skin is not sterile, your skin needs to be as free of germs as possible.  You can reduce the number of germs on you skin by washing with CHG (chlorahexidine gluconate) soap before surgery.  CHG is an antiseptic cleaner which kills germs and bonds with the skin to continue killing germs even after washing.  Please DO NOT use if you have an allergy to CHG or antibacterial soaps.  If your skin becomes reddened/irritated stop using the CHG and inform your nurse when you arrive at Short Stay.  Do not shave  (including legs and underarms) for at least 48 hours prior to the first CHG shower.  You may shave your face.  Please follow these instructions carefully:   1.  Shower with CHG Soap the night before surgery and the                                morning of Surgery.  2.  If you choose to wash your hair, wash your hair first as usual with your       normal shampoo.  3.  After you shampoo, rinse your hair and body thoroughly to remove the                      Shampoo.  4.  Use CHG as you would any other liquid soap.  You can apply chg directly       to the skin and wash gently with scrungie or a clean washcloth.  5.  Apply the CHG Soap to your body ONLY FROM THE NECK DOWN.        Do not use on open wounds or open sores.  Avoid contact with your eyes,       ears, mouth and genitals (private parts).  Wash genitals (private parts)       with your normal soap.  6.  Wash thoroughly, paying special attention to the area where your surgery        will be performed.  7.  Thoroughly rinse your body with warm water from the neck down.  8.  DO NOT shower/wash with your normal soap after using and rinsing off       the CHG Soap.  9.  Pat yourself dry with a clean towel.            10.  Wear clean pajamas.            11.  Place clean sheets on your bed the night of your first shower and do not        sleep with pets.  Day of Surgery  Do not apply any lotions/deoderants the morning of surgery.  Please wear clean clothes to the hospital/surgery center.     Please read over the following fact sheets that you were given: Pain Booklet, Coughing and Deep Breathing, Blood Transfusion Information, MRSA Information and Surgical Site Infection Prevention

## 2014-03-06 NOTE — Progress Notes (Addendum)
Anesthesia Chart Review:  Patient is a 59 year old male scheduled for ORIF left distal femur fracture on 03/07/14 by Dr. Carola Frost. PAT is scheduled for 03/06/14 at [facility].  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 History of nocturnal home O2. Nephrologist is Dr. Caryn Section (10/2013 notes scanned under Media tab). Dr. Scherrie Gerlach note indicates that patient informed him that he would likely require additional surgery for his left femur fracture in the next few months.  Cardiologist is Dr. Shirlee Latch (last office visit 09/19/13) who gave permission to hold ASA for surgery. He is not felt to be a Coumadin candidate for PAF due to his GI bleed history.  Meds include allopurinol, amiodarone, amlodipine, ASAS, Coreg, Lasix, hydralazine, Imdur, Zaroxolyn, Protonix, KCL, pravastatin, prednisone, sirolimus.  06/13/13 Echo:  - Left ventricle: The ventricle is dilated. The measurement seems similar to the prior tracing. But the measurement may not be optimally obatained. There is good LV motion. The estimated ejection fraction was 60%. Wall motion was normal; there were no regional wall motion abnormalities. - Aortic valve: There is very mild aortic stenosis. There is mild/moderate AI. - Aorta: There is a target seen in the aortic arch. This may be calcium in the vesssel wall, but I can not be sure. This part of the arch was not seen as well on the prior study, so I can not say if this was present before or not. - Right ventricle: The cavity size was mildly dilated. Systolic function was mildly reduced. - Right  atrium: The atrium was mildly dilated. - Pulmonary arteries: PA peak pressure: 35 mm Hg (S). (Dr. Shirlee Latch discussed echodensity finding--possible plaque versus artifact--TEE was considered but felt findings would not likely alter management, so was ultimately deferred.)  10/04/11 Nuclear stress test:  IMPRESSION: 1. No scintigraphic evidence of prior infarction or pharmacologically induced ischemia. 2. Mildly dilated left ventricle with mild global hypokinesia. Ejection fraction - 42%.  EKG: NSR. Inferior infarct, age undetermined. Anterolateral infarct, age undetermined. Prolonged QT.   Labs pending his PAT visit. His Cr was 4.52 at [facility] on 10/31/13 and was felt stable at that time.  Velna Ochs [facility] Short Stay Center/Anesthesiology Phone [phone] 03/06/2014 12:43 PM  Addendum: Discussed EKG with Dr. Michelle Piper.   Preoperative labs reviewed.  Cr 4.71, c/w previous.   Rica Mast, FNP-BC [facility] Short Stay Surgical Center/Anesthesiology Phone: [phone] 03/06/2014 4:10 PM

## 2014-03-07 LAB — C-REACTIVE PROTEIN: CRP: 2.7 mg/dL — AB (ref <0.60)

## 2014-03-07 LAB — VITAMIN D 1,25 DIHYDROXY: VITAMIN D 1, 25 (OH) TOTAL: UNDETERMINED pg/mL

## 2014-03-07 NOTE — Progress Notes (Signed)
Left message notifying pt of surgery date time and arrival time. Instructed pt that his surgery is now on 03/09/14 at 8:00 AM and he needs to be at Short Stay at 6:00 AM.

## 2014-03-13 ENCOUNTER — Encounter (HOSPITAL_COMMUNITY): Payer: Self-pay | Admitting: Orthopedic Surgery

## 2014-03-13 NOTE — H&P (Signed)
Orthopaedic Trauma Service H&P    Chief Complaint: Left femur nonunion  HPI:   59 y/o black male with complex medical hx including Hepatitis C and renal transplant presents for repair of L distal femur nonunion.  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  Pt sustained a ground level fall in 2014 and had numerous procedures on this. Some question as to the presence of infection. Pt remains on abx. Continues to have pain and difficulty with daily activities. Presents today for repair of his nonunion   Past Medical History  Diagnosis Date  . CKD (chronic kidney disease), stage IV     a. s/p renal transplant 1996.  Marland Kitchen HLD (hyperlipidemia)     takes Pravastatin daily  . Nonischemic cardiomyopathy     a. unknown etiology, EF 30-35% by echo 09/2011;  b. 09/2011 Lexi MV EF 42%, no ischemia/infarct.  . Systolic CHF     takes Lasix daily  . Atrial flutter     takes Amiodarone daily  . Gout     takes Allopurinol daily  . Hypertension     takes Amlodipine,Hydralazine,Imdur,and Coreg daily  . GERD (gastroesophageal reflux disease)     takes Protonix daily  . Shortness of breath dyspnea     exertion  . Pneumonia 2014  . History of bronchitis     several yrs ago  . Joint pain     right shoulder and left knee  . Sciatica   . Hepatitis C 1996    Hep C  . History of colon polyps   . History of kidney stones   . Urinary frequency   . History of shingles     Past Surgical History  Procedure Laterality Date  . Nephrectomy transplanted organ Right 26yrs ago  . Insertion of dialysis catheter  06/20/2011    Procedure: INSERTION OF DIALYSIS CATHETER;  Surgeon: Serafina Mitchell, MD;  Location: [place];  Service: Vascular;  Laterality: Right;  Ultrasound guided insertion of right internal jugular dialysis catheter  . Multiple extractions with alveoloplasty  06/27/2011    Procedure: MULTIPLE EXTRACION WITH ALVEOLOPLASTY;  Surgeon: Lenn Cal, DDS;  Location: [place];  Service: Oral Surgery;  Laterality:  N/A;  Extraction  of tooth # 14 with alveoloplasty  . Hip surgery Left     replacement  . Joint replacement      left hip  . Femur fracture surgery Left 07/09/2012    Dr Sherrian Divers  . Femur im nail Left 07/10/2012    ORIF L distal femur;  Surgeon: Wylene Simmer, MD;  Location: [place];  Service: Orthopedics;  Laterality: Left;  . I&d extremity Left 08/24/2012    Procedure: LEFT LEG IRRIGATION AND DEBRIDEMENT WITH POSSIBLE WOUND VAC APPLICATION;  Surgeon: Wylene Simmer, MD;  Location: [place];  Service: Orthopedics;  Laterality: Left;  . Tracheostomy  2014  . Tracheostomy closure  2014    2 wks after it was done  . Colonsocopy     Left femur surgery in 06/2012 was ORIF L distal femur, not IMN- unable to change primary header in computer    Family History  Problem Relation Age of Onset  . Heart disease Neg Hx    Social History:  reports that he has never smoked. He has never used smokeless tobacco. He reports that he does not drink alcohol or use illicit drugs.  Allergies: No Known Allergies  No current facility-administered medications on file prior to encounter.   Current Outpatient Prescriptions on  File Prior to Encounter  Medication Sig Dispense Refill  . allopurinol (ZYLOPRIM) 100 MG tablet Take 1 tablet (100 mg total) by mouth 2 (two) times daily. 60 tablet 1  . amiodarone (PACERONE) 100 MG tablet TAKE 1 TABLET BY MOUTH EVERY DAY 30 tablet 6  . amLODipine (NORVASC) 2.5 MG tablet Take 1 tablet (2.5 mg total) by mouth daily. 30 tablet 6  . amoxicillin-clavulanate (AUGMENTIN) 500-125 MG per tablet TAKE 1 TABLET BY MOUTH TWICE A DAY 60 tablet 2  . aspirin 325 MG tablet Take 325 mg by mouth daily.    . camphor-menthol (SARNA) lotion Apply 1 application topically as needed for itching.     . carvedilol (COREG) 25 MG tablet TAKE 1 TABLET TWICE DAILY WITH A MEAL 60 tablet 3  . Cholecalciferol (VITAMIN D-3) 5000 UNITS TABS Take 5,000 Units by mouth daily.     . furosemide (LASIX) 80 MG tablet Take one  and one-half pill twice a day (Patient taking differently: Take 40-80 mg by mouth 2 (two) times daily. Take $RemoveBef'80mg'WtjHifiaJi$  in the morning and $RemoveBef'40mg'DnZZASETJm$  at lunch) 90 tablet 6  . hydrALAZINE (APRESOLINE) 100 MG tablet TAKE 1 TABLET (100 MG TOTAL) BY MOUTH 3 (THREE) TIMES DAILY. 90 tablet 3  . methocarbamol (ROBAXIN) 500 MG tablet Take 500 mg by mouth daily.     . pantoprazole (PROTONIX) 20 MG tablet Take 20 mg by mouth daily as needed for heartburn.     . potassium chloride SA (KLOR-CON M20) 20 MEQ tablet TAKE 2 TABLET twice daily    . pravastatin (PRAVACHOL) 40 MG tablet TAKE 1 TABLET (40 MG TOTAL) BY MOUTH DAILY. 30 tablet 6  . predniSONE (DELTASONE) 10 MG tablet Take 1 tablet (10 mg total) by mouth daily. 30 tablet 1  . Probiotic Product (PROBIOTIC DAILY PO) Take 1 capsule by mouth daily.     . sirolimus (RAPAMUNE) 1 MG/ML solution Take 1.2 mg by mouth every morning.     . traMADol (ULTRAM) 50 MG tablet Take 50 mg by mouth every 12 (twelve) hours as needed (pain). As needed for pain    . amiodarone (PACERONE) 200 MG tablet Take 0.5 tablets (100 mg total) by mouth daily. (Patient not taking: Reported on 03/02/2014) 30 tablet 2  . amiodarone (PACERONE) 200 MG tablet TAKE 1 TABLET BY MOUTH EVERY DAY (Patient not taking: Reported on 03/02/2014) 30 tablet 6  . hydrALAZINE (APRESOLINE) 100 MG tablet TAKE 1 TABLET (100 MG TOTAL) BY MOUTH 3 (THREE) TIMES DAILY. (Patient not taking: Reported on 03/02/2014) 90 tablet 3   Labs  Results for JIMMY, PLESSINGER (MRN [id number]) as of 03/13/2014 14:08  Ref. Range 03/06/2014 15:19  Sodium Latest Range: 135-145 mmol/L 137  Potassium Latest Range: 3.5-5.1 mmol/L 4.3  Chloride Latest Range: 96-112 mmol/L 94 (L)  CO2 Latest Range: 19-32 mmol/L 26  BUN Latest Range: 6-23 mg/dL 92 (H)  Creatinine Latest Range: 0.50-1.35 mg/dL 4.71 (H)  Calcium Latest Range: 8.4-10.5 mg/dL 10.0  GFR calc non Af Amer Latest Range: >90 mL/min 13 (L)  GFR calc Af Amer Latest Range: >90 mL/min 15 (L)   Glucose Latest Range: 70-99 mg/dL 104 (H)  Anion gap Latest Range: 5-15  17 (H)  Alkaline Phosphatase Latest Range: 39-117 U/L 52  Albumin Latest Range: 3.5-5.2 g/dL 3.3 (L)  AST Latest Range: 0-37 U/L 27  ALT Latest Range: 0-53 U/L 14  Total Protein Latest Range: 6.0-8.3 g/dL 9.0 (H)  Total Bilirubin Latest Range: 0.3-1.2 mg/dL 0.6  CRP  Latest Range: <0.60 mg/dL 2.7 (H)  Vitamin D 1, 25 (OH) Total Latest Units: pg/mL QUANTITY NOT SUFF...   Results for DREZDEN, SEITZINGER (MRN [id number]) as of 03/13/2014 14:08  Ref. Range 03/06/2014 15:00  WBC Latest Range: 4.0-10.5 K/uL 8.1  RBC Latest Range: 4.22-5.81 MIL/uL 4.62  Hemoglobin Latest Range: 13.0-17.0 g/dL 13.7  HCT Latest Range: 39.0-52.0 % 40.1  MCV Latest Range: 78.0-100.0 fL 86.8  MCH Latest Range: 26.0-34.0 pg 29.7  MCHC Latest Range: 30.0-36.0 g/dL 34.2  RDW Latest Range: 11.5-15.5 % 11.9  Platelets Latest Range: 150-400 K/uL 274  Neutrophils Relative % Latest Range: 43-77 % 40 (L)  Lymphocytes Relative Latest Range: 12-46 % 28  Monocytes Relative Latest Range: 3-12 % 6  Eosinophils Relative Latest Range: 0-5 % 25 (H)  Basophils Relative Latest Range: 0-1 % 1  NEUT# Latest Range: 1.7-7.7 K/uL 3.3  Lymphocytes Absolute Latest Range: 0.7-4.0 K/uL 2.3  Monocytes Absolute Latest Range: 0.1-1.0 K/uL 0.4  Eosinophils Absolute Latest Range: 0.0-0.7 K/uL 2.0 (H)  Basophils Absolute Latest Range: 0.0-0.1 K/uL 0.1   Results for VINTON, LAYSON (MRN [id number]) as of 03/13/2014 14:08  Ref. Range 03/06/2014 15:19  Sed Rate Latest Range: 0-16 mm/hr 57 (H)   Results for RODRICUS, CANDELARIA (MRN [id number]) as of 03/13/2014 14:08  Ref. Range 03/06/2014 15:19  Prothrombin Time Latest Range: 11.6-15.2 seconds 13.8  INR Latest Range: 0.00-1.49  1.05  APTT Latest Range: 24-37 seconds 29  Glucose Latest Range: 70-99 mg/dL 104 (H)   Results for MICHOLAS, DRUMWRIGHT (MRN [id number]) as of 03/13/2014 14:08  Ref. Range 03/06/2014 15:19  Sample Expiration No  range found 03/20/2014  Antibody Screen No range found NEG  ABO/RH(D) No range found B POS    Review of Systems  Constitutional: Negative for fever and chills.  Respiratory: Negative for shortness of breath and wheezing.   Cardiovascular: Negative for chest pain and palpitations.  Gastrointestinal: Negative for nausea and vomiting.  Genitourinary: Negative for dysuria and urgency.  Neurological: Negative for tingling, sensory change and headaches.     Vitals to be taken on arrival to short stay  Physical Exam  Constitutional: He is oriented to person, place, and time. He is cooperative.  Chronically sick appearing black male   Cardiovascular: S1 normal and S2 normal.   Respiratory:  Clear bilaterally   GI:  Soft, +BS, NTND   Musculoskeletal:  Left Lower Extremity    Healed surgical wounds laterally    Relatively nontender over distal femur    No gross motion with manipulation of leg   Satisfactory ROM L knee and ankle   Distal motor and sensory functions intact    + DP pulse    + swelling   No erythema      Neurological: He is alert and oriented to person, place, and time.  Skin: Skin is warm and intact.     CT L knee/femur    Nonunion L distal femur   Assessment/Plan  59 y/o medically complex male with L distal femur nonunion   OR for repair of nonunion Hardware exchange, possible placement of abx spacer with delayed grafting  Will check xray of femur before surgery given length of time from previous CT ESR and CRP remain elevated, not sure what component of this can be related to renal and heart disease  Pt has THA on ipsilateral side which further complicates his clinical picture  Additional labs pending  CKD contributing to healing issues due to renal effects  on bone metabolism. New PTH pending   Admit for observation and pain control  Bone stim post op as well  Will contact renal and cards services if issues arise   Jari Pigg, PA-C [facility] [phone] (P) 03/13/2014, 2:35 PM

## 2014-03-14 ENCOUNTER — Inpatient Hospital Stay (HOSPITAL_COMMUNITY): Payer: Medicare HMO

## 2014-03-14 ENCOUNTER — Inpatient Hospital Stay (HOSPITAL_COMMUNITY): Payer: Medicare HMO | Admitting: Vascular Surgery

## 2014-03-14 ENCOUNTER — Encounter (HOSPITAL_COMMUNITY): Payer: Self-pay | Admitting: *Deleted

## 2014-03-14 ENCOUNTER — Inpatient Hospital Stay (HOSPITAL_COMMUNITY)
Admission: RE | Admit: 2014-03-14 | Discharge: 2014-03-22 | DRG: 498 | Disposition: A | Discharge status: Skilled Nursing Facility | Payer: Medicare HMO | Source: Ambulatory Visit | Attending: Orthopedic Surgery | Admitting: Orthopedic Surgery

## 2014-03-14 ENCOUNTER — Encounter (HOSPITAL_COMMUNITY)
Admission: RE | Disposition: A | Discharge status: Skilled Nursing Facility | Payer: Self-pay | Source: Ambulatory Visit | Attending: Orthopedic Surgery

## 2014-03-14 ENCOUNTER — Inpatient Hospital Stay (HOSPITAL_COMMUNITY): Payer: Medicare HMO | Admitting: Anesthesiology

## 2014-03-14 DIAGNOSIS — N189 Chronic kidney disease, unspecified: Secondary | ICD-10-CM | POA: Diagnosis present

## 2014-03-14 DIAGNOSIS — Z940 Kidney transplant status: Secondary | ICD-10-CM

## 2014-03-14 DIAGNOSIS — K219 Gastro-esophageal reflux disease without esophagitis: Secondary | ICD-10-CM | POA: Diagnosis present

## 2014-03-14 DIAGNOSIS — I4892 Unspecified atrial flutter: Secondary | ICD-10-CM | POA: Diagnosis present

## 2014-03-14 DIAGNOSIS — Z7982 Long term (current) use of aspirin: Secondary | ICD-10-CM | POA: Diagnosis not present

## 2014-03-14 DIAGNOSIS — Y831 Surgical operation with implant of artificial internal device as the cause of abnormal reaction of the patient, or of later complication, without mention of misadventure at the time of the procedure: Secondary | ICD-10-CM | POA: Diagnosis present

## 2014-03-14 DIAGNOSIS — I129 Hypertensive chronic kidney disease with stage 1 through stage 4 chronic kidney disease, or unspecified chronic kidney disease: Secondary | ICD-10-CM | POA: Diagnosis present

## 2014-03-14 DIAGNOSIS — N179 Acute kidney failure, unspecified: Secondary | ICD-10-CM | POA: Diagnosis not present

## 2014-03-14 DIAGNOSIS — D696 Thrombocytopenia, unspecified: Secondary | ICD-10-CM | POA: Diagnosis not present

## 2014-03-14 DIAGNOSIS — I5022 Chronic systolic (congestive) heart failure: Secondary | ICD-10-CM | POA: Diagnosis present

## 2014-03-14 DIAGNOSIS — E785 Hyperlipidemia, unspecified: Secondary | ICD-10-CM | POA: Diagnosis present

## 2014-03-14 DIAGNOSIS — Z992 Dependence on renal dialysis: Secondary | ICD-10-CM

## 2014-03-14 DIAGNOSIS — N184 Chronic kidney disease, stage 4 (severe): Secondary | ICD-10-CM | POA: Diagnosis present

## 2014-03-14 DIAGNOSIS — T380X5A Adverse effect of glucocorticoids and synthetic analogues, initial encounter: Secondary | ICD-10-CM | POA: Diagnosis not present

## 2014-03-14 DIAGNOSIS — I509 Heart failure, unspecified: Secondary | ICD-10-CM

## 2014-03-14 DIAGNOSIS — S72432K Displaced fracture of medial condyle of left femur, subsequent encounter for closed fracture with nonunion: Secondary | ICD-10-CM

## 2014-03-14 DIAGNOSIS — S7292XK Unspecified fracture of left femur, subsequent encounter for closed fracture with nonunion: Secondary | ICD-10-CM

## 2014-03-14 DIAGNOSIS — M109 Gout, unspecified: Secondary | ICD-10-CM | POA: Diagnosis present

## 2014-03-14 DIAGNOSIS — W19XXXA Unspecified fall, initial encounter: Secondary | ICD-10-CM

## 2014-03-14 DIAGNOSIS — Z6836 Body mass index (BMI) 36.0-36.9, adult: Secondary | ICD-10-CM

## 2014-03-14 DIAGNOSIS — T84498A Other mechanical complication of other internal orthopedic devices, implants and grafts, initial encounter: Secondary | ICD-10-CM | POA: Diagnosis present

## 2014-03-14 DIAGNOSIS — B1920 Unspecified viral hepatitis C without hepatic coma: Secondary | ICD-10-CM | POA: Diagnosis present

## 2014-03-14 DIAGNOSIS — D72829 Elevated white blood cell count, unspecified: Secondary | ICD-10-CM | POA: Diagnosis not present

## 2014-03-14 DIAGNOSIS — D62 Acute posthemorrhagic anemia: Secondary | ICD-10-CM | POA: Diagnosis not present

## 2014-03-14 DIAGNOSIS — R0602 Shortness of breath: Secondary | ICD-10-CM

## 2014-03-14 DIAGNOSIS — Z96642 Presence of left artificial hip joint: Secondary | ICD-10-CM | POA: Diagnosis present

## 2014-03-14 DIAGNOSIS — E669 Obesity, unspecified: Secondary | ICD-10-CM | POA: Diagnosis present

## 2014-03-14 DIAGNOSIS — Z9181 History of falling: Secondary | ICD-10-CM

## 2014-03-14 DIAGNOSIS — I9581 Postprocedural hypotension: Secondary | ICD-10-CM | POA: Diagnosis not present

## 2014-03-14 DIAGNOSIS — N2581 Secondary hyperparathyroidism of renal origin: Secondary | ICD-10-CM | POA: Diagnosis present

## 2014-03-14 DIAGNOSIS — M86652 Other chronic osteomyelitis, left thigh: Secondary | ICD-10-CM | POA: Diagnosis present

## 2014-03-14 DIAGNOSIS — Z79899 Other long term (current) drug therapy: Secondary | ICD-10-CM | POA: Diagnosis not present

## 2014-03-14 DIAGNOSIS — R52 Pain, unspecified: Secondary | ICD-10-CM

## 2014-03-14 DIAGNOSIS — S72455K Nondisplaced supracondylar fracture without intracondylar extension of lower end of left femur, subsequent encounter for closed fracture with nonunion: Principal | ICD-10-CM

## 2014-03-14 DIAGNOSIS — S72422K Displaced fracture of lateral condyle of left femur, subsequent encounter for closed fracture with nonunion: Secondary | ICD-10-CM | POA: Diagnosis present

## 2014-03-14 DIAGNOSIS — I429 Cardiomyopathy, unspecified: Secondary | ICD-10-CM

## 2014-03-14 DIAGNOSIS — W19XXXD Unspecified fall, subsequent encounter: Secondary | ICD-10-CM | POA: Diagnosis present

## 2014-03-14 DIAGNOSIS — M869 Osteomyelitis, unspecified: Secondary | ICD-10-CM | POA: Diagnosis present

## 2014-03-14 DIAGNOSIS — I10 Essential (primary) hypertension: Secondary | ICD-10-CM | POA: Diagnosis present

## 2014-03-14 DIAGNOSIS — Z7952 Long term (current) use of systemic steroids: Secondary | ICD-10-CM

## 2014-03-14 DIAGNOSIS — Z419 Encounter for procedure for purposes other than remedying health state, unspecified: Secondary | ICD-10-CM

## 2014-03-14 DIAGNOSIS — I420 Dilated cardiomyopathy: Secondary | ICD-10-CM

## 2014-03-14 DIAGNOSIS — Z9889 Other specified postprocedural states: Secondary | ICD-10-CM

## 2014-03-14 DIAGNOSIS — S7290XA Unspecified fracture of unspecified femur, initial encounter for closed fracture: Secondary | ICD-10-CM | POA: Insufficient documentation

## 2014-03-14 HISTORY — PX: WOUND DEBRIDEMENT: SHX247

## 2014-03-14 HISTORY — DX: Secondary hyperparathyroidism of renal origin: N25.81

## 2014-03-14 HISTORY — PX: OTHER SURGICAL HISTORY: SHX169

## 2014-03-14 HISTORY — PX: HARDWARE REMOVAL: SHX979

## 2014-03-14 LAB — CREATININE, SERUM
Creatinine, Ser: 4.94 mg/dL — ABNORMAL HIGH (ref 0.50–1.35)
GFR calc non Af Amer: 12 mL/min — ABNORMAL LOW (ref >90)
GFR, EST AFRICAN AMERICAN: 14 mL/min — AB (ref >90)

## 2014-03-14 LAB — CBC
HEMATOCRIT: 32.5 % — AB (ref 39.0–52.0)
Hemoglobin: 10.4 g/dL — ABNORMAL LOW (ref 13.0–17.0)
MCH: 31.5 pg (ref 26.0–34.0)
MCHC: 32 g/dL (ref 30.0–36.0)
MCV: 98.5 fL (ref 78.0–100.0)
Platelets: 182 10*3/uL (ref 150–400)
RBC: 3.3 MIL/uL — ABNORMAL LOW (ref 4.22–5.81)
RDW: 16.5 % — ABNORMAL HIGH (ref 11.5–15.5)
WBC: 18.3 10*3/uL — AB (ref 4.0–10.5)

## 2014-03-14 LAB — GRAM STAIN
Gram Stain: NONE SEEN
Gram Stain: NONE SEEN

## 2014-03-14 LAB — VITAMIN D 25 HYDROXY (VIT D DEFICIENCY, FRACTURES): VIT D 25 HYDROXY: 58 ng/mL (ref 30.0–100.0)

## 2014-03-14 SURGERY — REMOVAL, HARDWARE
Anesthesia: General | Site: Leg Upper | Laterality: Left

## 2014-03-14 MED ORDER — ROCURONIUM BROMIDE 50 MG/5ML IV SOLN
INTRAVENOUS | Status: AC
  Filled 2014-03-14: qty 1

## 2014-03-14 MED ORDER — ONDANSETRON HCL 4 MG/2ML IJ SOLN: 4.0000 mg | Freq: Four times a day (QID) | INTRAMUSCULAR | Status: DC | PRN

## 2014-03-14 MED ORDER — VANCOMYCIN HCL 1000 MG IV SOLR
INTRAVENOUS | Status: AC
  Filled 2014-03-14: qty 1000

## 2014-03-14 MED ORDER — PREDNISONE 10 MG PO TABS
10.0000 mg | ORAL_TABLET | Freq: Every day | ORAL | Status: DC
  Administered 2014-03-15 – 2014-03-22 (×8): 10 mg via ORAL
  Filled 2014-03-14 (×8): qty 1

## 2014-03-14 MED ORDER — PANTOPRAZOLE SODIUM 20 MG PO TBEC
20.0000 mg | DELAYED_RELEASE_TABLET | Freq: Every day | ORAL | Status: DC | PRN
  Filled 2014-03-14: qty 1

## 2014-03-14 MED ORDER — HYDRALAZINE HCL 50 MG PO TABS
100.0000 mg | ORAL_TABLET | Freq: Three times a day (TID) | ORAL | Status: DC
  Administered 2014-03-15: 100 mg via ORAL
  Filled 2014-03-14 (×8): qty 2

## 2014-03-14 MED ORDER — FENTANYL CITRATE 0.05 MG/ML IJ SOLN
INTRAMUSCULAR | Status: DC | PRN
  Administered 2014-03-14: 100 ug via INTRAVENOUS
  Administered 2014-03-14 (×3): 50 ug via INTRAVENOUS

## 2014-03-14 MED ORDER — OXYCODONE-ACETAMINOPHEN 5-325 MG PO TABS
ORAL_TABLET | ORAL | Status: AC
  Administered 2014-03-14: 2 via ORAL
  Filled 2014-03-14: qty 2

## 2014-03-14 MED ORDER — HYDROMORPHONE HCL 1 MG/ML IJ SOLN
0.2500 mg | INTRAMUSCULAR | Status: DC | PRN
  Administered 2014-03-14: 0.5 mg via INTRAVENOUS
  Administered 2014-03-14: 0.25 mg via INTRAVENOUS
  Administered 2014-03-14: 0.5 mg via INTRAVENOUS

## 2014-03-14 MED ORDER — ISOSORBIDE MONONITRATE ER 60 MG PO TB24
60.0000 mg | ORAL_TABLET | Freq: Every day | ORAL | Status: DC
  Administered 2014-03-15 – 2014-03-22 (×7): 60 mg via ORAL
  Filled 2014-03-14 (×8): qty 1

## 2014-03-14 MED ORDER — ROCURONIUM BROMIDE 100 MG/10ML IV SOLN
INTRAVENOUS | Status: DC | PRN
  Administered 2014-03-14: 50 mg via INTRAVENOUS

## 2014-03-14 MED ORDER — AMLODIPINE BESYLATE 2.5 MG PO TABS
2.5000 mg | ORAL_TABLET | Freq: Every day | ORAL | Status: DC
  Administered 2014-03-15: 2.5 mg via ORAL
  Filled 2014-03-14 (×2): qty 1

## 2014-03-14 MED ORDER — DEXTROSE 5 % IV SOLN
1000.0000 mg | Freq: Four times a day (QID) | INTRAVENOUS | Status: DC | PRN
  Filled 2014-03-14: qty 10

## 2014-03-14 MED ORDER — DIPHENHYDRAMINE HCL 12.5 MG/5ML PO ELIX: 12.5000 mg | ORAL_SOLUTION | ORAL | Status: DC | PRN

## 2014-03-14 MED ORDER — DEXAMETHASONE SODIUM PHOSPHATE 4 MG/ML IJ SOLN
INTRAMUSCULAR | Status: DC | PRN
  Administered 2014-03-14: 8 mg via INTRAVENOUS

## 2014-03-14 MED ORDER — METOCLOPRAMIDE HCL 5 MG/ML IJ SOLN: 5.0000 mg | Freq: Three times a day (TID) | INTRAMUSCULAR | Status: DC | PRN

## 2014-03-14 MED ORDER — VITAMIN D3 25 MCG (1000 UNIT) PO TABS
5000.0000 [IU] | ORAL_TABLET | Freq: Every day | ORAL | Status: DC
  Filled 2014-03-14: qty 5

## 2014-03-14 MED ORDER — ONDANSETRON HCL 4 MG PO TABS: 4.0000 mg | ORAL_TABLET | Freq: Four times a day (QID) | ORAL | Status: DC | PRN

## 2014-03-14 MED ORDER — PROMETHAZINE HCL 25 MG/ML IJ SOLN: 6.2500 mg | INTRAMUSCULAR | Status: DC | PRN

## 2014-03-14 MED ORDER — CALCITRIOL 0.5 MCG PO CAPS
0.5000 ug | ORAL_CAPSULE | Freq: Every day | ORAL | Status: DC
  Administered 2014-03-15 – 2014-03-22 (×8): 0.5 ug via ORAL
  Filled 2014-03-14 (×8): qty 1

## 2014-03-14 MED ORDER — METOCLOPRAMIDE HCL 10 MG PO TABS: 5.0000 mg | ORAL_TABLET | Freq: Three times a day (TID) | ORAL | Status: DC | PRN

## 2014-03-14 MED ORDER — AMIODARONE HCL 100 MG PO TABS
100.0000 mg | ORAL_TABLET | Freq: Every day | ORAL | Status: DC
Start: 2014-03-15 — End: 2014-03-22
  Administered 2014-03-15 – 2014-03-22 (×8): 100 mg via ORAL
  Filled 2014-03-14 (×8): qty 1

## 2014-03-14 MED ORDER — TOBRAMYCIN SULFATE 1.2 G IJ SOLR
INTRAMUSCULAR | Status: DC | PRN
  Administered 2014-03-14: 1.2 g

## 2014-03-14 MED ORDER — FUROSEMIDE 40 MG PO TABS: 40.0000 mg | ORAL_TABLET | Freq: Two times a day (BID) | ORAL | Status: DC

## 2014-03-14 MED ORDER — FUROSEMIDE 40 MG PO TABS
80.0000 mg | ORAL_TABLET | Freq: Every day | ORAL | Status: DC
  Administered 2014-03-15: 80 mg via ORAL
  Filled 2014-03-14: qty 2

## 2014-03-14 MED ORDER — POTASSIUM CHLORIDE CRYS ER 20 MEQ PO TBCR
20.0000 meq | EXTENDED_RELEASE_TABLET | Freq: Two times a day (BID) | ORAL | Status: DC
  Administered 2014-03-14 – 2014-03-16 (×4): 20 meq via ORAL
  Filled 2014-03-14 (×4): qty 1

## 2014-03-14 MED ORDER — HYDROMORPHONE HCL 1 MG/ML IJ SOLN
INTRAMUSCULAR | Status: AC
  Administered 2014-03-14: 0.25 mg via INTRAVENOUS
  Filled 2014-03-14: qty 1

## 2014-03-14 MED ORDER — CARVEDILOL 25 MG PO TABS
25.0000 mg | ORAL_TABLET | Freq: Every day | ORAL | Status: DC
  Administered 2014-03-15 – 2014-03-16 (×2): 25 mg via ORAL
  Filled 2014-03-14 (×2): qty 1

## 2014-03-14 MED ORDER — METHOCARBAMOL 500 MG PO TABS
ORAL_TABLET | ORAL | Status: AC
  Filled 2014-03-14: qty 1

## 2014-03-14 MED ORDER — PRAVASTATIN SODIUM 40 MG PO TABS
40.0000 mg | ORAL_TABLET | Freq: Every day | ORAL | Status: DC
  Administered 2014-03-14 – 2014-03-21 (×8): 40 mg via ORAL
  Filled 2014-03-14 (×14): qty 1

## 2014-03-14 MED ORDER — FENTANYL CITRATE 0.05 MG/ML IJ SOLN
INTRAMUSCULAR | Status: AC
  Filled 2014-03-14: qty 5

## 2014-03-14 MED ORDER — PHENYLEPHRINE HCL 10 MG/ML IJ SOLN
INTRAMUSCULAR | Status: DC | PRN
  Administered 2014-03-14 (×5): 80 ug via INTRAVENOUS

## 2014-03-14 MED ORDER — SIROLIMUS 1 MG/ML PO SOLN
1.2000 mg | Freq: Every morning | ORAL | Status: DC
  Administered 2014-03-15 – 2014-03-22 (×8): 1.2 mg via ORAL
  Filled 2014-03-14 (×3): qty 1.2

## 2014-03-14 MED ORDER — SODIUM CHLORIDE 0.9 % IV SOLN
INTRAVENOUS | Status: DC
  Administered 2014-03-14: 11:00:00 via INTRAVENOUS

## 2014-03-14 MED ORDER — 0.9 % SODIUM CHLORIDE (POUR BTL) OPTIME
TOPICAL | Status: DC | PRN
  Administered 2014-03-14: 1000 mL

## 2014-03-14 MED ORDER — ENOXAPARIN SODIUM 30 MG/0.3ML ~~LOC~~ SOLN
30.0000 mg | SUBCUTANEOUS | Status: DC
  Administered 2014-03-15 – 2014-03-20 (×6): 30 mg via SUBCUTANEOUS
  Filled 2014-03-14 (×6): qty 0.3

## 2014-03-14 MED ORDER — PROPOFOL 10 MG/ML IV BOLUS
INTRAVENOUS | Status: AC
  Filled 2014-03-14: qty 20

## 2014-03-14 MED ORDER — CEFAZOLIN SODIUM-DEXTROSE 2-3 GM-% IV SOLR
INTRAVENOUS | Status: DC | PRN
  Administered 2014-03-14: 2 g via INTRAVENOUS

## 2014-03-14 MED ORDER — ONDANSETRON HCL 4 MG/2ML IJ SOLN
INTRAMUSCULAR | Status: DC | PRN
  Administered 2014-03-14: 4 mg via INTRAVENOUS

## 2014-03-14 MED ORDER — NEOSTIGMINE METHYLSULFATE 10 MG/10ML IV SOLN
INTRAVENOUS | Status: DC | PRN
  Administered 2014-03-14: 3 mg via INTRAVENOUS

## 2014-03-14 MED ORDER — VANCOMYCIN HCL 1000 MG IV SOLR
INTRAVENOUS | Status: DC | PRN
  Administered 2014-03-14: 1000 mg

## 2014-03-14 MED ORDER — GLYCOPYRROLATE 0.2 MG/ML IJ SOLN
INTRAMUSCULAR | Status: DC | PRN
  Administered 2014-03-14: 0.4 mg via INTRAVENOUS

## 2014-03-14 MED ORDER — TOBRAMYCIN SULFATE 1.2 G IJ SOLR
INTRAMUSCULAR | Status: AC
  Filled 2014-03-14: qty 1.2

## 2014-03-14 MED ORDER — MIDAZOLAM HCL 5 MG/5ML IJ SOLN
INTRAMUSCULAR | Status: DC | PRN
  Administered 2014-03-14: 2 mg via INTRAVENOUS

## 2014-03-14 MED ORDER — MIDAZOLAM HCL 2 MG/2ML IJ SOLN
INTRAMUSCULAR | Status: AC
  Filled 2014-03-14: qty 2

## 2014-03-14 MED ORDER — FUROSEMIDE 40 MG PO TABS: 40.0000 mg | ORAL_TABLET | Freq: Every day | ORAL | Status: DC

## 2014-03-14 MED ORDER — ALLOPURINOL 100 MG PO TABS
100.0000 mg | ORAL_TABLET | Freq: Two times a day (BID) | ORAL | Status: DC
  Administered 2014-03-14 – 2014-03-22 (×16): 100 mg via ORAL
  Filled 2014-03-14 (×17): qty 1

## 2014-03-14 MED ORDER — PIPERACILLIN-TAZOBACTAM IN DEX 2-0.25 GM/50ML IV SOLN
2.2500 g | Freq: Four times a day (QID) | INTRAVENOUS | Status: DC
  Administered 2014-03-14 – 2014-03-17 (×9): 2.25 g via INTRAVENOUS
  Filled 2014-03-14 (×12): qty 50

## 2014-03-14 MED ORDER — OXYCODONE-ACETAMINOPHEN 5-325 MG PO TABS
1.0000 | ORAL_TABLET | ORAL | Status: DC | PRN
  Administered 2014-03-14 – 2014-03-17 (×7): 2 via ORAL
  Administered 2014-03-17: 1 via ORAL
  Administered 2014-03-17 – 2014-03-18 (×2): 2 via ORAL
  Filled 2014-03-14 (×4): qty 2
  Filled 2014-03-14: qty 1
  Filled 2014-03-14 (×5): qty 2

## 2014-03-14 MED ORDER — PROPOFOL 10 MG/ML IV BOLUS
INTRAVENOUS | Status: DC | PRN
  Administered 2014-03-14: 40 mg via INTRAVENOUS
  Administered 2014-03-14: 20 mg via INTRAVENOUS
  Administered 2014-03-14: 200 mg via INTRAVENOUS

## 2014-03-14 MED ORDER — SENNOSIDES-DOCUSATE SODIUM 8.6-50 MG PO TABS: 1.0000 | ORAL_TABLET | Freq: Every evening | ORAL | Status: DC | PRN

## 2014-03-14 MED ORDER — VANCOMYCIN HCL 10 G IV SOLR
1500.0000 mg | Freq: Once | INTRAVENOUS | Status: AC
  Administered 2014-03-14: 1500 mg via INTRAVENOUS
  Filled 2014-03-14: qty 1500

## 2014-03-14 MED ORDER — LIDOCAINE HCL (CARDIAC) 20 MG/ML IV SOLN
INTRAVENOUS | Status: AC
  Filled 2014-03-14: qty 5

## 2014-03-14 MED ORDER — METHOCARBAMOL 500 MG PO TABS
1000.0000 mg | ORAL_TABLET | Freq: Four times a day (QID) | ORAL | Status: DC | PRN
  Administered 2014-03-14 – 2014-03-21 (×10): 1000 mg via ORAL
  Filled 2014-03-14 (×10): qty 2

## 2014-03-14 MED ORDER — HYDROMORPHONE HCL 1 MG/ML IJ SOLN: 0.5000 mg | INTRAMUSCULAR | Status: DC | PRN

## 2014-03-14 MED ORDER — LIDOCAINE HCL (CARDIAC) 20 MG/ML IV SOLN
INTRAVENOUS | Status: DC | PRN
  Administered 2014-03-14: 80 mg via INTRAVENOUS

## 2014-03-14 MED ORDER — POTASSIUM CHLORIDE IN NACL 20-0.9 MEQ/L-% IV SOLN
INTRAVENOUS | Status: DC
  Administered 2014-03-14: 23:00:00 via INTRAVENOUS
  Filled 2014-03-14 (×2): qty 1000

## 2014-03-14 MED ORDER — OXYCODONE HCL 5 MG PO TABS
5.0000 mg | ORAL_TABLET | ORAL | Status: DC | PRN
  Administered 2014-03-14 – 2014-03-22 (×20): 10 mg via ORAL
  Filled 2014-03-14 (×20): qty 2

## 2014-03-14 MED ORDER — HYDROMORPHONE HCL 1 MG/ML IJ SOLN
INTRAMUSCULAR | Status: AC
  Administered 2014-03-14: 0.5 mg via INTRAVENOUS
  Filled 2014-03-14: qty 1

## 2014-03-14 MED ORDER — DOCUSATE SODIUM 100 MG PO CAPS
100.0000 mg | ORAL_CAPSULE | Freq: Two times a day (BID) | ORAL | Status: DC
  Administered 2014-03-14 – 2014-03-21 (×15): 100 mg via ORAL
  Filled 2014-03-14 (×13): qty 1

## 2014-03-14 MED ORDER — METHOCARBAMOL 500 MG PO TABS
ORAL_TABLET | ORAL | Status: AC
  Administered 2014-03-14: 1000 mg via ORAL
  Filled 2014-03-14: qty 1

## 2014-03-14 MED ORDER — VANCOMYCIN HCL IN DEXTROSE 1-5 GM/200ML-% IV SOLN
1000.0000 mg | INTRAVENOUS | Status: AC
  Administered 2014-03-14: 1000 mg via INTRAVENOUS
  Filled 2014-03-14: qty 200

## 2014-03-14 MED ORDER — OXYCODONE HCL 5 MG PO TABS
ORAL_TABLET | ORAL | Status: AC
  Administered 2014-03-14: 10 mg via ORAL
  Filled 2014-03-14: qty 2

## 2014-03-14 SURGICAL SUPPLY — 80 items
BANDAGE ELASTIC 4 VELCRO ST LF (GAUZE/BANDAGES/DRESSINGS) ×4 IMPLANT
BANDAGE ELASTIC 6 VELCRO ST LF (GAUZE/BANDAGES/DRESSINGS) ×4 IMPLANT
BANDAGE ESMARK 6X9 LF (GAUZE/BANDAGES/DRESSINGS) ×2 IMPLANT
BNDG CMPR 9X6 STRL LF SNTH (GAUZE/BANDAGES/DRESSINGS) ×2
BNDG ESMARK 6X9 LF (GAUZE/BANDAGES/DRESSINGS) ×4
BNDG GAUZE ELAST 4 BULKY (GAUZE/BANDAGES/DRESSINGS) ×8 IMPLANT
BONE CEMENT PALACOSE (Orthopedic Implant) ×8 IMPLANT
BOWL SMART MIX CTS (DISPOSABLE) ×3 IMPLANT
BRUSH SCRUB DISP (MISCELLANEOUS) ×8 IMPLANT
CANISTER SUCT 3000ML PPV (MISCELLANEOUS) ×4 IMPLANT
CEMENT BONE PALACOSE (Orthopedic Implant) ×2 IMPLANT
COVER SURGICAL LIGHT HANDLE (MISCELLANEOUS) ×8 IMPLANT
DRAPE C-ARM 42X72 X-RAY (DRAPES) ×4 IMPLANT
DRAPE C-ARMOR (DRAPES) ×4 IMPLANT
DRAPE IMP U-DRAPE 54X76 (DRAPES) ×4 IMPLANT
DRAPE OEC MINIVIEW 54X84 (DRAPES) ×4 IMPLANT
DRAPE ORTHO SPLIT 77X108 STRL (DRAPES) ×12
DRAPE SURG ORHT 6 SPLT 77X108 (DRAPES) ×6 IMPLANT
DRAPE U-SHAPE 47X51 STRL (DRAPES) ×4 IMPLANT
DRSG ADAPTIC 3X8 NADH LF (GAUZE/BANDAGES/DRESSINGS) ×4 IMPLANT
DRSG MEPITEL 4X7.2 (GAUZE/BANDAGES/DRESSINGS) ×3 IMPLANT
DRSG PAD ABDOMINAL 8X10 ST (GAUZE/BANDAGES/DRESSINGS) ×10 IMPLANT
ELECT REM PT RETURN 9FT ADLT (ELECTROSURGICAL) ×4
ELECTRODE REM PT RTRN 9FT ADLT (ELECTROSURGICAL) ×2 IMPLANT
EVACUATOR 1/8 PVC DRAIN (DRAIN) IMPLANT
EVACUATOR 3/16  PVC DRAIN (DRAIN)
EVACUATOR 3/16 PVC DRAIN (DRAIN) IMPLANT
GAUZE SPONGE 4X4 12PLY STRL (GAUZE/BANDAGES/DRESSINGS) ×4 IMPLANT
GLOVE BIO SURGEON STRL SZ7.5 (GLOVE) ×10 IMPLANT
GLOVE BIO SURGEON STRL SZ8 (GLOVE) ×10 IMPLANT
GLOVE BIOGEL PI IND STRL 7.5 (GLOVE) ×3 IMPLANT
GLOVE BIOGEL PI IND STRL 8 (GLOVE) ×2 IMPLANT
GLOVE BIOGEL PI INDICATOR 7.5 (GLOVE) ×4
GLOVE BIOGEL PI INDICATOR 8 (GLOVE) ×2
GOWN STRL REUS W/ TWL LRG LVL3 (GOWN DISPOSABLE) ×6 IMPLANT
GOWN STRL REUS W/ TWL XL LVL3 (GOWN DISPOSABLE) ×2 IMPLANT
GOWN STRL REUS W/TWL LRG LVL3 (GOWN DISPOSABLE) ×16
GOWN STRL REUS W/TWL XL LVL3 (GOWN DISPOSABLE) ×4
KIT BASIN OR (CUSTOM PROCEDURE TRAY) ×4 IMPLANT
KIT ROOM TURNOVER OR (KITS) ×4 IMPLANT
MANIFOLD NEPTUNE II (INSTRUMENTS) ×4 IMPLANT
NEEDLE 22X1 1/2 (OR ONLY) (NEEDLE) IMPLANT
NS IRRIG 1000ML POUR BTL (IV SOLUTION) ×4 IMPLANT
PACK ORTHO EXTREMITY (CUSTOM PROCEDURE TRAY) ×4 IMPLANT
PACK TOTAL JOINT (CUSTOM PROCEDURE TRAY) ×4 IMPLANT
PACK UNIVERSAL I (CUSTOM PROCEDURE TRAY) ×4 IMPLANT
PAD ABD 8X10 STRL (GAUZE/BANDAGES/DRESSINGS) ×3 IMPLANT
PAD ARMBOARD 7.5X6 YLW CONV (MISCELLANEOUS) ×8 IMPLANT
PAD CAST 4YDX4 CTTN HI CHSV (CAST SUPPLIES) ×2 IMPLANT
PADDING CAST ABS 6INX4YD NS (CAST SUPPLIES) ×2
PADDING CAST ABS COTTON 6X4 NS (CAST SUPPLIES) ×1 IMPLANT
PADDING CAST COTTON 4X4 STRL (CAST SUPPLIES) ×4
PADDING CAST COTTON 6X4 STRL (CAST SUPPLIES) ×9 IMPLANT
SPONGE GAUZE 4X4 12PLY STER LF (GAUZE/BANDAGES/DRESSINGS) ×3 IMPLANT
SPONGE LAP 18X18 X RAY DECT (DISPOSABLE) ×7 IMPLANT
SPONGE SCRUB IODOPHOR (GAUZE/BANDAGES/DRESSINGS) ×4 IMPLANT
STAPLER VISISTAT 35W (STAPLE) ×4 IMPLANT
SUT ETHILON 3 0 FSL (SUTURE) ×6 IMPLANT
SUT ETHILON 3 0 PS 1 (SUTURE) IMPLANT
SUT PDS AB 1 CT  36 (SUTURE) ×4
SUT PDS AB 1 CT 36 (SUTURE) ×2 IMPLANT
SUT PDS AB 2-0 CT1 27 (SUTURE) ×6 IMPLANT
SUT PROLENE 0 CT 2 (SUTURE) IMPLANT
SUT PROLENE 2 TP 1 (SUTURE) ×6 IMPLANT
SUT VIC AB 0 CT1 27 (SUTURE) ×4
SUT VIC AB 0 CT1 27XBRD ANBCTR (SUTURE) ×3 IMPLANT
SUT VIC AB 1 CT1 27 (SUTURE) ×8
SUT VIC AB 1 CT1 27XBRD ANBCTR (SUTURE) ×4 IMPLANT
SUT VIC AB 2-0 CT1 27 (SUTURE) ×8
SUT VIC AB 2-0 CT1 TAPERPNT 27 (SUTURE) ×4 IMPLANT
SWAB COLLECTION DEVICE MRSA (MISCELLANEOUS) ×3 IMPLANT
SYR 20ML ECCENTRIC (SYRINGE) IMPLANT
SYR CONTROL 10ML LL (SYRINGE) IMPLANT
TOWEL OR 17X24 6PK STRL BLUE (TOWEL DISPOSABLE) ×8 IMPLANT
TOWEL OR 17X26 10 PK STRL BLUE (TOWEL DISPOSABLE) ×8 IMPLANT
TUBE ANAEROBIC SPECIMEN COL (MISCELLANEOUS) ×3 IMPLANT
TUBE CONNECTING 12'X1/4 (SUCTIONS)
TUBE CONNECTING 12X1/4 (SUCTIONS) ×1 IMPLANT
UNDERPAD 30X30 INCONTINENT (UNDERPADS AND DIAPERS) ×4 IMPLANT
YANKAUER SUCT BULB TIP NO VENT (SUCTIONS) ×1 IMPLANT

## 2014-03-14 NOTE — Brief Op Note (Signed)
03/14/2014  3:33 PM  PATIENT:  Chad [name]  59 y.o. male  PRE-OPERATIVE DIAGNOSIS:   1. NON UNION LEFT FEMUR 2. LOOSE HARDWARE  POST-OPERATIVE DIAGNOSIS:  1. INFECTED NON UNION LEFT FEMUR 2. LOOSE HARDWARE  PROCEDURE:  Procedure(s) with comments: 1. PARTIAL EXCISION LEFT FEMUR 2. OPEN TREATMENT SUPRACONDYLAR NONUNION FEMUR, LEFT 3. HARDWARE REMOVAL LEFT (Left) 4. INSERTION OF ANTIBIOTIC Beads, Left (2 strands: 15 beads and 14 beads)  SURGEON:  Surgeon(s) and Role:    * [name], MD - Primary  PHYSICIAN ASSISTANT: Montez Morita, PA-C  ANESTHESIA:   general  I/O:  Total I/O In: 300 [I.V.:300] Out: 50 [Blood:50]  SPECIMEN:  Source of Specimen:  1. Deep abscess; 2. Nonunion site supracondylar femur; 3. intramedullary involucrum/ sequestrum  DISPOSITION OF SPECIMEN:  To micro  TOURNIQUET:  * No tourniquets in log *  DICTATION: .Other Dictation: Dictation Number [id number]

## 2014-03-14 NOTE — Anesthesia Preprocedure Evaluation (Addendum)
Anesthesia Evaluation  Patient identified by MRN, date of birth, ID band Patient awake    Reviewed: Allergy & Precautions, H&P , NPO status , Patient's Chart, lab work & pertinent test results  History of Anesthesia Complications Negative for: history of anesthetic complications  Airway Mallampati: II  TM Distance: >3 FB Neck ROM: Full    Dental  (+) Teeth Intact, Dental Advisory Given   Pulmonary shortness of breath, sleep apnea , pneumonia -, resolved,  breath sounds clear to auscultation  + decreased breath sounds      Cardiovascular hypertension, Pt. on medications and Pt. on home beta blockers +CHF + dysrhythmias Atrial Fibrillation Rhythm:Regular Rate:Normal  Echo 11/2011  - Left ventricle: The cavity size was mildly dilated. Wall   thickness was increased in a pattern of moderate LVH. The  estimated ejection fraction was 35%. Diffuse hypokinesis.  Features are consistent with a pseudonormal left  ventricular filling pattern, with concomitant abnormal  relaxation and increased filling pressure (grade 2  diastolic dysfunction). Doppler parameters are consistent  with high ventricular filling pressure. - Aortic valve: Sclerosis without stenosis. Trivial   regurgitation. - Mitral valve: Mild regurgitation. Valve area by pressure   half-time: 2.44cm^2. - Left atrium: The atrium was moderately dilated. - Right ventricle: The cavity size was mildly dilated.   Systolic function was mildly reduced. - Right atrium: The atrium was mildly dilated. - Pulmonary arteries: PA peak pressure: 97mm Hg (S). - Inferior vena cava: The vessel was dilated; the   respirophasic diameter changes were blunted (< 50%);   findings are consistent with elevated central venous   pressure. - Pericardium, extracardiac: A small pericardial effusion   was identified posterior to the heart.    Neuro/Psych negative psych ROS   GI/Hepatic negative GI ROS,  GERD-  Controlled,(+) Hepatitis -, C, B  Endo/Other  negative endocrine ROS  Renal/GU Renal InsufficiencyRenal disease     Musculoskeletal  (+) Arthritis -,   Abdominal (+) + obese,   Peds  Hematology negative hematology ROS (+)   Anesthesia Other Findings   Reproductive/Obstetrics negative OB ROS                            Anesthesia Physical Anesthesia Plan  ASA: III  Anesthesia Plan: General   Post-op Pain Management:    Induction: Intravenous  Airway Management Planned: Oral ETT  Additional Equipment:   Intra-op Plan:   Post-operative Plan: Extubation in OR  Informed Consent: I have reviewed the patients History and Physical, chart, labs and discussed the procedure including the risks, benefits and alternatives for the proposed anesthesia with the patient or authorized representative who has indicated his/her understanding and acceptance.   Dental advisory given  Plan Discussed with: CRNA and Surgeon  Anesthesia Plan Comments:         Anesthesia Quick Evaluation

## 2014-03-14 NOTE — OR Nursing (Signed)
03-14-14 In OR: 2 strands of antibiotic beads inserted into left knee wound. 1 strand with 17 beads, 1 strand with 14 beads.

## 2014-03-14 NOTE — Progress Notes (Signed)
Patient ID: DEQUANTE TREMAINE, male   DOB: 08-Sep-1955, 59 y.o.   MRN: [id number]         [facility]    Date of Admission:  03/14/2014   Chronic therapy with amoxicillin clavulanate  Active Problems:   Osteomyelitis   . chlorhexidine  60 mL Topical Once  . HYDROmorphone      . methocarbamol      . oxyCODONE-acetaminophen        Subjective: Mr. Daigle fell and sustained a distal left femur fracture in June 2014. He underwent open reduction and internal fixation with a plate and had a prolonged hospitalization for inpatient rehabilitation. He had some persistent thin drainage from one area of the wound swab cultures of wound drainage in late July 2014 grew a very sensitive Proteus and enterococcus that may have represented insignificant skin contaminants. He was started on empiric vancomycin and piperacillin tazobactam and had I and D. A large seroma was encountered that tracked down to the plate. There was also an area that looked like old hematoma lateral to the knee but not involving the joint. Operative Gram stain showed no organisms and cultures showed no growth. He was discharged on IV vancomycin and meropenem and then transition to chronic oral amoxicillin clavulanate. He was admitted today and underwent further incision and drainage with hardware removal and antibiotic bead placement.  Review of Systems: Review of systems not obtained due to patient factors.  Past Medical History  Diagnosis Date  . CKD (chronic kidney disease), stage IV     a. s/p renal transplant 1996.  Marland Kitchen HLD (hyperlipidemia)     takes Pravastatin daily  . Nonischemic cardiomyopathy     a. unknown etiology, EF 30-35% by echo 09/2011;  b. 09/2011 Lexi MV EF 42%, no ischemia/infarct.  . Systolic CHF     takes Lasix daily  . Atrial flutter     takes Amiodarone daily  . Gout     takes Allopurinol daily  . Hypertension     takes Amlodipine,Hydralazine,Imdur,and Coreg daily  . GERD  (gastroesophageal reflux disease)     takes Protonix daily  . Shortness of breath dyspnea     exertion  . Pneumonia 2014  . History of bronchitis     several yrs ago  . Joint pain     right shoulder and left knee  . Sciatica   . Hepatitis C 1996    Hep C  . History of colon polyps   . History of kidney stones   . Urinary frequency   . History of shingles     History  Substance Use Topics  . Smoking status: Never Smoker   . Smokeless tobacco: Never Used  . Alcohol Use: No    Family History  Problem Relation Age of Onset  . Heart disease Neg Hx    No Known Allergies  OBJECTIVE: Blood pressure 142/86, pulse 78, temperature 98.1 F (36.7 C), temperature source Oral, resp. rate 20, height 6' (1.829 m), weight 272 lb (123.378 kg), SpO2 94 %. He is currently in the recovery room  Lab Results Lab Results  Component Value Date   WBC 8.1 03/06/2014   HGB 13.7 03/06/2014   HCT 40.1 03/06/2014   MCV 86.8 03/06/2014   PLT 274 03/06/2014    Lab Results  Component Value Date   CREATININE 4.71* 03/06/2014   BUN 92* 03/06/2014   NA 137 03/06/2014   K 4.3 03/06/2014   CL 94*  03/06/2014   CO2 26 03/06/2014    Lab Results  Component Value Date   ALT 14 03/06/2014   AST 27 03/06/2014   ALKPHOS 52 03/06/2014   BILITOT 0.6 03/06/2014     Microbiology: Recent Results (from the past 240 hour(s))  Gram stain     Status: None   Collection Time: 03/14/14 12:40 PM  Result Value Ref Range Status   Specimen Description WOUND LEFT THIGH  Final   Special Requests NONE  Final   Gram Stain   Final    FEW WBC PRESENT, PREDOMINANTLY PMN NO ORGANISMS SEEN Gram Stain Report Called to,Read Back By and Verified With: D.JOHNSON,RN 03/14/14 1320 BY BSLADE    Report Status 03/14/2014 FINAL  Final    Assessment: I will follow-up on operative cultures and provide further recommendations about antibiotic therapy as results become available.  Plan: 1. Observe off of antibiotics for  now pending culture results  Cliffton Asters, MD [facility] [phone] pager   [phone] cell 03/14/2014, 3:49 PM

## 2014-03-14 NOTE — Consult Note (Signed)
Requesting physician: Dr Myrene Galas  Reason for consultation: Management of underlying medical conditions   History of Present Illness: [name] is a 59 year old male with multiple medical comorbidities including chronic kidney disease stage IV status post kidney transplant in 1996, nonischemic cardiomyopathy with EF of 30-35% as per echo in 2013, a flutter on amiodarone, gout, hypertension, GERD, history of hep C, kidney stones. He also had a distal left femur fracture in June 2014 for which he underwent open reduction and internal fixation with a plate with prolonged hospitalization for inpatient rehabilitation. He had persistent drainage from the wound which grew Proteus and enterococcus and was treated with empiric vancomycin and Zosyn and required I&D as well. He was then discharged on prolonged course of oral Augmentin. He was brought in today to the OR for repair of left distal femur nonunion. He underwent partial excision of the left femur followed by removal of the hardware and insertion of antibiotic beads. He tolerated the surgery well. Hospitalist consulted for assistance in management of a multiple medical conditions.  Recent seen in the PACU. He is quite drowsy postop and answers a few questions only. He complains of pain over the surgical site. Denies any headache, dizziness, blurred vision, chest pain, shortness of breath, palpitations, fevers, chills, nausea, vomiting, abdominal pain, bowel or urinary symptoms. He reports being compliant with his medications.  Allergies:  No Known Allergies    Past Medical History  Diagnosis Date  . CKD (chronic kidney disease), stage IV     a. s/p renal transplant 1996.  Marland Kitchen HLD (hyperlipidemia)     takes Pravastatin daily  . Nonischemic cardiomyopathy     a. unknown etiology, EF 30-35% by echo 09/2011;  b. 09/2011 Lexi MV EF 42%, no ischemia/infarct.  . Systolic CHF     takes Lasix daily  . Atrial flutter     takes Amiodarone  daily  . Gout     takes Allopurinol daily  . Hypertension     takes Amlodipine,Hydralazine,Imdur,and Coreg daily  . GERD (gastroesophageal reflux disease)     takes Protonix daily  . Shortness of breath dyspnea     exertion  . Pneumonia 2014  . History of bronchitis     several yrs ago  . Joint pain     right shoulder and left knee  . Sciatica   . Hepatitis C 1996    Hep C  . History of colon polyps   . History of kidney stones   . Urinary frequency   . History of shingles     Past Surgical History  Procedure Laterality Date  . Nephrectomy transplanted organ Right 46yrs ago  . Insertion of dialysis catheter  06/20/2011    Procedure: INSERTION OF DIALYSIS CATHETER;  Surgeon: [name], MD;  Location: [facility] OR;  Service: Vascular;  Laterality: Right;  Ultrasound guided insertion of right internal jugular dialysis catheter  . Multiple extractions with alveoloplasty  06/27/2011    Procedure: MULTIPLE EXTRACION WITH ALVEOLOPLASTY;  Surgeon: [name], DDS;  Location: [facility] OR;  Service: Oral Surgery;  Laterality: N/A;  Extraction  of tooth # 14 with alveoloplasty  . Hip surgery Left     replacement  . Joint replacement      left hip  . Femur fracture surgery Left 07/09/2012    Dr Deno Etienne  . Femur im nail Left 07/10/2012    ORIF L distal femur;  Surgeon: Toni Arthurs, MD;  Location: [facility] OR;  Service:  Orthopedics;  Laterality: Left;  . I&d extremity Left 08/24/2012    Procedure: LEFT LEG IRRIGATION AND DEBRIDEMENT WITH POSSIBLE WOUND VAC APPLICATION;  Surgeon: Toni Arthurs, MD;  Location: [facility] OR;  Service: Orthopedics;  Laterality: Left;  . Tracheostomy  2014  . Tracheostomy closure  2014    2 wks after it was done  . Colonsocopy      Medications: Current Outpatient Prescriptions on File Prior to Encounter  Medication Sig Dispense Refill  . allopurinol (ZYLOPRIM) 100 MG tablet Take 1 tablet (100 mg total) by mouth 2 (two) times daily. 60 tablet 1  . amiodarone  (PACERONE) 100 MG tablet TAKE 1 TABLET BY MOUTH EVERY DAY 30 tablet 6  . amLODipine (NORVASC) 2.5 MG tablet Take 1 tablet (2.5 mg total) by mouth daily. 30 tablet 6  . amoxicillin-clavulanate (AUGMENTIN) 500-125 MG per tablet TAKE 1 TABLET BY MOUTH TWICE A DAY 60 tablet 2  . aspirin 325 MG tablet Take 325 mg by mouth daily.    . camphor-menthol (SARNA) lotion Apply 1 application topically as needed for itching.     . carvedilol (COREG) 25 MG tablet TAKE 1 TABLET TWICE DAILY WITH A MEAL 60 tablet 3  . Cholecalciferol (VITAMIN D-3) 5000 UNITS TABS Take 5,000 Units by mouth daily.     . furosemide (LASIX) 80 MG tablet Take one and one-half pill twice a day (Patient taking differently: Take 40-80 mg by mouth 2 (two) times daily. Take  in the morning and  at lunch) 90 tablet 6  . hydrALAZINE (APRESOLINE) 100 MG tablet TAKE 1 TABLET (100 MG TOTAL) BY MOUTH 3 (THREE) TIMES DAILY. 90 tablet 3  . methocarbamol (ROBAXIN) 500 MG tablet Take 500 mg by mouth daily.     . pantoprazole (PROTONIX) 20 MG tablet Take 20 mg by mouth daily as needed for heartburn.     . potassium chloride SA (KLOR-CON M20) 20 MEQ tablet TAKE 2 TABLET twice daily    . pravastatin (PRAVACHOL) 40 MG tablet TAKE 1 TABLET (40 MG TOTAL) BY MOUTH DAILY. 30 tablet 6  . predniSONE (DELTASONE) 10 MG tablet Take 1 tablet (10 mg total) by mouth daily. 30 tablet 1  . Probiotic Product (PROBIOTIC DAILY PO) Take 1 capsule by mouth daily.     . sirolimus (RAPAMUNE) 1 MG/ML solution Take 1.2 mg by mouth every morning.     . traMADol (ULTRAM) 50 MG tablet Take 50 mg by mouth every 12 (twelve) hours as needed (pain). As needed for pain    .      Marland Kitchen            Continuous Infusions: . sodium chloride      Social History:  reports that he has never smoked. He has never used smokeless tobacco. He reports that he does not drink alcohol or use illicit  drugs.  Family History  Problem Relation Age of Onset  . Heart disease Neg Hx     Review of Systems:  Outlined in history of present illness. Patient drowsy post of and unable to provide a full review of systems.   Physical Exam:  Filed Vitals:   03/14/14 1615 03/14/14 1630 03/14/14 1645 03/14/14 1700  BP: 126/80 130/73 124/72   Pulse: 74 75 76 77  Temp:      TempSrc:      Resp: Height:      Weight:      SpO2: 92% 91% 92% 92%  Intake/Output Summary (Last 24 hours) at 03/14/14 1703 Last data filed at 03/14/14 1524  Gross per 24 hour  Intake    300 ml  Output     50 ml  Net    250 ml    General: Visit male lying in bed appears sleepy but arousable. HEENT: Pallor, no icterus, moist oral mucosa, no cervical lymphadenopathy, neck supple Heart: Normal S1 and S2, no murmurs rub or gallop Lungs: Clear to auscultation bilaterally, no added sounds GI: Soft, nontender, nondistended, positive bowel sounds,  Extremities: Dressing over left leg extending to the foot with Ace wrap, chronic skin changes over right leg Neuro: Drowsy but arousable to commands, nonfocal  Labs on Admission:  CBC:    Component Value Date/Time   WBC 8.1 03/06/2014 1500   HGB 13.7 03/06/2014 1500   HCT 40.1 03/06/2014 1500   PLT 274 03/06/2014 1500   MCV 86.8 03/06/2014 1500   NEUTROABS 3.3 03/06/2014 1500   LYMPHSABS 2.3 03/06/2014 1500   MONOABS 0.4 03/06/2014 1500   EOSABS 2.0* 03/06/2014 1500   BASOSABS 0.1 03/06/2014 1500    Basic Metabolic Panel:    Component Value Date/Time   NA 137 03/06/2014 1519   K 4.3 03/06/2014 1519   CL 94* 03/06/2014 1519   CO2 26 03/06/2014 1519   BUN 92* 03/06/2014 1519   CREATININE 4.71* 03/06/2014 1519   GLUCOSE 104* 03/06/2014 1519   CALCIUM 10.0 03/06/2014 1519    Radiological Exams on Admission: Dg Knee Complete 4 Views Left  03/14/2014   CLINICAL DATA:  Intraoperative fixation, left femoral fracture  EXAM: DG C-ARM 61-120 MIN;  LEFT KNEE - COMPLETE 4+ VIEW  COMPARISON:  Same date  FINDINGS: Four intraprocedural images demonstrate apparent removal of previously seen distal femoral fixation hardware, with fracture line remaining visible. There has been apparent placement of presumed antibiotic beads and/or cement within the distal left femur.  IMPRESSION: Intraoperative imaging as above.   Electronically Signed   By: Christiana Pellant M.D.   On: 03/14/2014 15:33   Dg Knee Left Port  03/14/2014   CLINICAL DATA:  Postoperative exam after hardware removal  EXAM: PORTABLE LEFT KNEE - 1-2 VIEW  COMPARISON:  Intraoperative imaging same date  FINDINGS: Again noted is radiopaque material within the distal left femur, likely antibiotic pellets or cement material. Comminuted and mildly impacted chronic left distal femoral fracture reidentified. No new fracture identified. Soft tissue gas is noted. Vascular calcifications are noted. Suprapatellar gas/ fluid level is noted.  IMPRESSION: Expected postoperative appearance.   Electronically Signed   By: Christiana Pellant M.D.   On: 03/14/2014 16:41   Dg C-arm 1-60 Min  03/14/2014   CLINICAL DATA:  Intraoperative fixation, left femoral fracture  EXAM: DG C-ARM 61-120 MIN; LEFT KNEE - COMPLETE 4+ VIEW  COMPARISON:  Same date  FINDINGS: Four intraprocedural images demonstrate apparent removal of previously seen distal femoral fixation hardware, with fracture line remaining visible. There has been apparent placement of presumed antibiotic beads and/or cement within the distal left femur.  IMPRESSION: Intraoperative imaging as above.   Electronically Signed   By: Christiana Pellant M.D.   On: 03/14/2014 15:33   Dg Femur Min 2 Views Left  03/14/2014   CLINICAL DATA:  Femur fracture.  Nonunion.  Subsequent encounter.  EXAM: LEFT FEMUR 2 VIEWS  COMPARISON:  CT left knee 12/28/2013  FINDINGS: Left hip not included in these images of the mid and distal femur. Prior left hip replacement. Tip of  the femoral  prosthesis in good position.  Metal sideplate along the lateral surface of the femur. There are multiple screws in the mid and distal femur. There is a fracture of the distal femur in the supracondylar region. There is sclerosis and residual lucency at the fracture site indicating nonunion. Hardware in satisfactory position. 15 mm medial displacement of the femoral condyles is unchanged from the prior study.  IMPRESSION: Nonunion distal femoral fracture.  Hardware unchanged in position.   Electronically Signed   By: Marlan Palau M.D.   On: 03/14/2014 10:11    Assessment/recommendations Status post hardware removal and antibiotic with placement over left leg Pain control and further plan per primary team. DVT prophylaxis starting tomorrow. -PT eval. -Follow operative cultures. ID recommends to monitor off antibiotics.  Chronic kidney disease stage IV Creatinine of  4.7. No recent baseline creatinine in the system. Monitor in a.m. Monitor  urine output. Avoid nephrotoxins.  Hx of renal transplant continue rapamune and prednisone  Nonischemic cardiomyopathy appears euvolemic clinically Continue home dose Lasix. Continue aspirin and statin. Monitor on telemetry.  Hyperlipidemia Continue statin  A flutter Rate control. Not on anticoagulation. Continue amiodarone.  Hypertension Continue amlodipine and Coreg  Gout Continue allopurinol  GERD Continue PPI   DVT prophylaxis SCD for now. Will need chemical prophylaxis from tomorrow.  Thank you for the consult. Hospitalist will continue to follow.    Time Spent on Admission: 55 minutes  Paul Torpey 03/14/2014, 5:03 PM

## 2014-03-14 NOTE — Transfer of Care (Signed)
Immediate Anesthesia Transfer of Care Note  Patient: Chad [name]  Procedure(s) Performed: Procedure(s) with comments: HARDWARE REMOVAL LEFT (Left) DEBRIDEMENT WOUND (Left) - INSERTION OF  ANTIBIOTIC Beads  (2 strands: 15 beads and 14 beads)  Patient Location: PACU  Anesthesia Type:General  Level of Consciousness: awake  Airway & Oxygen Therapy: Patient Spontanous Breathing and Patient connected to nasal cannula oxygen  Post-op Assessment: Report given to RN and Post -op Vital signs reviewed and stable  Post vital signs: Reviewed and stable  Last Vitals:  Filed Vitals:   03/14/14 0931  BP: 183/83  Pulse: 78  Temp: 37.1 C  Resp: 20    Complications: No apparent anesthesia complications

## 2014-03-14 NOTE — Progress Notes (Signed)
ANTIBIOTIC CONSULT NOTE - INITIAL  Pharmacy Consult for vancomycin and Zosyn Indication: osteomyelitis - s/p repainr of left distal femur nonunion and hardward removal on 03/14/14  No Known Allergies  Patient Measurements: Height: 6' (182.9 cm) Weight: 272 lb (123.378 kg) IBW/kg (Calculated) : 77.6  Vital Signs: Temp: 97.7 F (36.5 C) (02/16 2018) Temp Source: Oral (02/16 0931) BP: 102/58 mmHg (02/16 2018) Pulse Rate: 85 (02/16 2018) Intake/Output from previous day:   Intake/Output from this shift:    Labs: No results for input(s): WBC, HGB, PLT, LABCREA, CREATININE in the last 72 hours. Estimated Creatinine Clearance: 23.5 mL/min (by C-G formula based on Cr of 4.71). No results for input(s): VANCOTROUGH, VANCOPEAK, VANCORANDOM, GENTTROUGH, GENTPEAK, GENTRANDOM, TOBRATROUGH, TOBRAPEAK, TOBRARND, AMIKACINPEAK, AMIKACINTROU, AMIKACIN in the last 72 hours.   Microbiology: Recent Results (from the past 720 hour(s))  Gram stain     Status: None   Collection Time: 03/14/14 12:40 PM  Result Value Ref Range Status   Specimen Description WOUND LEFT THIGH  Final   Special Requests NONE  Final   Gram Stain   Final    FEW WBC PRESENT, PREDOMINANTLY PMN NO ORGANISMS SEEN Gram Stain Report Called to,Read Back By and Verified With: D.JOHNSON,RN 03/14/14 1320 BY BSLADE    Report Status 03/14/2014 FINAL  Final  Gram stain     Status: None   Collection Time: 03/14/14  1:29 PM  Result Value Ref Range Status   Specimen Description TISSUE LEG LEFT  Final   Special Requests SPEC (C) PT ON VANCOMYCIN  Final   Gram Stain NO WBC SEEN NO ORGANISMS SEEN   Final   Report Status 03/14/2014 FINAL  Final  Gram stain     Status: None   Collection Time: 03/14/14  1:37 PM  Result Value Ref Range Status   Specimen Description TISSUE LEG LEFT  Final   Special Requests NON UNION LEFT FEMUR SPEC (B) PT ON VANCOMYCIN  Final   Gram Stain NO WBC SEEN NO ORGANISMS SEEN   Final   Report Status  03/14/2014 FINAL  Final  Gram stain     Status: None   Collection Time: 03/14/14  1:37 PM  Result Value Ref Range Status   Specimen Description TISSUE LEG LEFT  Final   Special Requests NON UNION LEFT FEMUR SPEC (D) PT ON VANCOMYCIN  Final   Gram Stain FEW WBC PRESENT, PREDOMINANTLY MONONUCLEAR   Final   Report Status 03/14/2014 FINAL  Final    Medical History: Past Medical History  Diagnosis Date  . CKD (chronic kidney disease), stage IV     a. s/p renal transplant 1996.  Marland Kitchen HLD (hyperlipidemia)     takes Pravastatin daily  . Nonischemic cardiomyopathy     a. unknown etiology, EF 30-35% by echo 09/2011;  b. 09/2011 Lexi MV EF 42%, no ischemia/infarct.  . Systolic CHF     takes Lasix daily  . Atrial flutter     takes Amiodarone daily  . Gout     takes Allopurinol daily  . Hypertension     takes Amlodipine,Hydralazine,Imdur,and Coreg daily  . GERD (gastroesophageal reflux disease)     takes Protonix daily  . Shortness of breath dyspnea     exertion  . Pneumonia 2014  . History of bronchitis     several yrs ago  . Joint pain     right shoulder and left knee  . Sciatica   . Hepatitis C 1996    Hep C  .  History of colon polyps   . History of kidney stones   . Urinary frequency   . History of shingles     Medications:  Prescriptions prior to admission  Medication Sig Dispense Refill Last Dose  . allopurinol (ZYLOPRIM) 100 MG tablet Take 1 tablet (100 mg total) by mouth 2 (two) times daily. 60 tablet 1 03/14/2014 at 0800  . amiodarone (PACERONE) 100 MG tablet TAKE 1 TABLET BY MOUTH EVERY DAY 30 tablet 6 03/14/2014 at 0800  . amLODipine (NORVASC) 2.5 MG tablet Take 1 tablet (2.5 mg total) by mouth daily. 30 tablet 6 03/14/2014 at 0800  . aspirin 325 MG tablet Take 325 mg by mouth daily.   03/13/2014 at Unknown time  . calcitRIOL (ROCALTROL) 0.5 MCG capsule Take 0.5 mcg by mouth daily.  11 03/14/2014 at 0800  . carvedilol (COREG) 25 MG tablet TAKE 1 TABLET TWICE DAILY WITH A  MEAL 60 tablet 3 03/14/2014 at 0800  . Cholecalciferol (VITAMIN D-3) 5000 UNITS TABS Take 5,000 Units by mouth daily.    03/13/2014 at Unknown time  . furosemide (LASIX) 80 MG tablet Take one and one-half pill twice a day (Patient taking differently: Take 40-80 mg by mouth 2 (two) times daily. Take 80mg  in the morning and 40mg  at lunch) 90 tablet 6 03/13/2014 at Unknown time  . hydrALAZINE (APRESOLINE) 100 MG tablet TAKE 1 TABLET (100 MG TOTAL) BY MOUTH 3 (THREE) TIMES DAILY. 90 tablet 3 03/13/2014 at Unknown time  . isosorbide mononitrate (IMDUR) 60 MG 24 hr tablet TAKE 1.5 TABLETS BY MOUTH EVERY DAY 45 tablet 2 03/14/2014 at 0800  . metolazone (ZAROXOLYN) 2.5 MG tablet TAKE 1 TABLET BY MOUTH 30 MINUTES BEFORE YOUR MORNING LASIX ON TUES, THURS, AND SAT MORNINGS 15 tablet 3 Past Week at Unknown time  . pantoprazole (PROTONIX) 20 MG tablet Take 20 mg by mouth daily as needed for heartburn.    Past Month at Unknown time  . potassium chloride SA (KLOR-CON M20) 20 MEQ tablet TAKE 2 TABLET twice daily   03/13/2014 at Unknown time  . pravastatin (PRAVACHOL) 40 MG tablet TAKE 1 TABLET (40 MG TOTAL) BY MOUTH DAILY. 30 tablet 6 03/13/2014 at Unknown time  . predniSONE (DELTASONE) 10 MG tablet Take 1 tablet (10 mg total) by mouth daily. 30 tablet 1 03/14/2014 at 0800  . Probiotic Product (PROBIOTIC DAILY PO) Take 1 capsule by mouth daily.    Past Month at Unknown time  . sirolimus (RAPAMUNE) 1 MG/ML solution Take 1.2 mg by mouth every morning.    03/13/2014 at Unknown time  . traMADol (ULTRAM) 50 MG tablet Take 50 mg by mouth every 12 (twelve) hours as needed (pain). As needed for pain   Past Week at Unknown time  . [DISCONTINUED] amoxicillin-clavulanate (AUGMENTIN) 500-125 MG per tablet TAKE 1 TABLET BY MOUTH TWICE A DAY 60 tablet 2 03/13/2014 at Unknown time  . amiodarone (PACERONE) 200 MG tablet Take 0.5 tablets (100 mg total) by mouth daily. (Patient not taking: Reported on 03/02/2014) 30 tablet 2 Not Taking at Unknown  time  . amiodarone (PACERONE) 200 MG tablet TAKE 1 TABLET BY MOUTH EVERY DAY (Patient not taking: Reported on 03/02/2014) 30 tablet 6 Not Taking at Unknown time  . camphor-menthol (SARNA) lotion Apply 1 application topically as needed for itching.    More than a month at Unknown time  . hydrALAZINE (APRESOLINE) 100 MG tablet TAKE 1 TABLET (100 MG TOTAL) BY MOUTH 3 (THREE) TIMES DAILY. (Patient not taking:  Reported on 03/02/2014) 90 tablet 3 Not Taking at Unknown time  . hydrALAZINE (APRESOLINE) 100 MG tablet TAKE 1 TABLET (100 MG TOTAL) BY MOUTH 3 (THREE) TIMES DAILY. (Patient not taking: Reported on 03/02/2014) 90 tablet 3 Not Taking at Unknown time  . methocarbamol (ROBAXIN) 500 MG tablet Take 500 mg by mouth daily.    More than a month at Unknown time   Assessment: 59 year old man s/p repair of left distal femur nonunion and removal of hardware and placement of antibiotic beads (vancomycin and tobramycin).  He received vancomycin 1g and ancef 2g after cultures were taken intra-operatively.  He has CKD s/p renal transplant.  His latest creatinine is 4.71 on 2/8 with an estimated creatinine clearance of 59mL/min.  Goal of Therapy:  Vancomycin trough level 15-20 mcg/ml  Plan:  Measure antibiotic drug levels at steady state Follow up culture results Vancomycin 1.5g x 1 dose tonight to complete his loading dose (total of 2.5g today).  Will provide initial regimen after we have current labs (probably q48h frequency) Zosyn 2.25g IV q6h Monitor renal function  Mickeal Skinner 03/14/2014,8:22 PM

## 2014-03-15 ENCOUNTER — Encounter (HOSPITAL_COMMUNITY): Payer: Self-pay | Admitting: General Practice

## 2014-03-15 DIAGNOSIS — M25552 Pain in left hip: Secondary | ICD-10-CM

## 2014-03-15 DIAGNOSIS — M84452K Pathological fracture, left femur, subsequent encounter for fracture with nonunion: Secondary | ICD-10-CM

## 2014-03-15 DIAGNOSIS — R0602 Shortness of breath: Secondary | ICD-10-CM

## 2014-03-15 DIAGNOSIS — N189 Chronic kidney disease, unspecified: Secondary | ICD-10-CM

## 2014-03-15 LAB — COMPREHENSIVE METABOLIC PANEL
ALT: 10 U/L (ref 0–53)
ANION GAP: 8 (ref 5–15)
AST: 22 U/L (ref 0–37)
Albumin: 2.2 g/dL — ABNORMAL LOW (ref 3.5–5.2)
Alkaline Phosphatase: 28 U/L — ABNORMAL LOW (ref 39–117)
BUN: 115 mg/dL — AB (ref 6–23)
CO2: 32 mmol/L (ref 19–32)
CREATININE: 5.04 mg/dL — AB (ref 0.50–1.35)
Calcium: 8.2 mg/dL — ABNORMAL LOW (ref 8.4–10.5)
Chloride: 96 mmol/L (ref 96–112)
GFR, EST AFRICAN AMERICAN: 13 mL/min — AB (ref >90)
GFR, EST NON AFRICAN AMERICAN: 12 mL/min — AB (ref >90)
Glucose, Bld: 158 mg/dL — ABNORMAL HIGH (ref 70–99)
Potassium: 4.1 mmol/L (ref 3.5–5.1)
Sodium: 136 mmol/L (ref 135–145)
Total Bilirubin: 0.7 mg/dL (ref 0.3–1.2)
Total Protein: 5.6 g/dL — ABNORMAL LOW (ref 6.0–8.3)

## 2014-03-15 LAB — GRAM STAIN

## 2014-03-15 LAB — CBC
HCT: 25.5 % — ABNORMAL LOW (ref 39.0–52.0)
Hemoglobin: 8.2 g/dL — ABNORMAL LOW (ref 13.0–17.0)
MCH: 31 pg (ref 26.0–34.0)
MCHC: 31.8 g/dL (ref 30.0–36.0)
MCV: 97.7 fL (ref 78.0–100.0)
Platelets: 147 10*3/uL — ABNORMAL LOW (ref 150–400)
RBC: 2.61 MIL/uL — AB (ref 4.22–5.81)
RDW: 16.7 % — AB (ref 11.5–15.5)
WBC: 14.6 10*3/uL — AB (ref 4.0–10.5)

## 2014-03-15 LAB — SEX HORMONE BINDING GLOBULIN: Sex Hormone Binding: 34 nmol/L (ref 22–77)

## 2014-03-15 LAB — PTH, INTACT AND CALCIUM
CALCIUM TOTAL (PTH): 9.4 mg/dL (ref 8.7–10.2)
PTH: 120 pg/mL — AB (ref 15–65)

## 2014-03-15 LAB — TESTOSTERONE: TESTOSTERONE: 248 ng/dL — AB (ref 300–890)

## 2014-03-15 LAB — VITAMIN D 25 HYDROXY (VIT D DEFICIENCY, FRACTURES): VIT D 25 HYDROXY: 46.3 ng/mL (ref 30.0–100.0)

## 2014-03-15 LAB — TESTOSTERONE, FREE: Testosterone, Free: 47.1 pg/mL (ref 47.0–244.0)

## 2014-03-15 LAB — TESTOSTERONE, % FREE: TESTOSTERONE-% FREE: 1.9 % — AB (ref 1.6–2.9)

## 2014-03-15 MED ORDER — CHOLECALCIFEROL 125 MCG (5000 UT) PO CAPS
5000.0000 [IU] | ORAL_CAPSULE | Freq: Every day | ORAL | Status: DC
  Administered 2014-03-16 – 2014-03-22 (×4): 5000 [IU] via ORAL
  Filled 2014-03-15 (×9): qty 1

## 2014-03-15 MED ORDER — SODIUM CHLORIDE 0.9 % IV SOLN
INTRAVENOUS | Status: DC
  Administered 2014-03-15 – 2014-03-21 (×4): via INTRAVENOUS

## 2014-03-15 MED ORDER — METOLAZONE 2.5 MG PO TABS
2.5000 mg | ORAL_TABLET | ORAL | Status: DC
  Filled 2014-03-15: qty 1

## 2014-03-15 MED ORDER — WHITE PETROLATUM GEL: Status: DC | PRN

## 2014-03-15 MED ORDER — ASPIRIN 325 MG PO TABS
325.0000 mg | ORAL_TABLET | Freq: Every day | ORAL | Status: DC
  Administered 2014-03-15: 325 mg via ORAL
  Filled 2014-03-15: qty 1

## 2014-03-15 MED ORDER — WHITE PETROLATUM GEL
Status: AC
  Filled 2014-03-15: qty 1

## 2014-03-15 NOTE — Progress Notes (Addendum)
Pt brought own Rapamune from home; medication is not in [facility] formulary. Medication counted with pt and sent to pharmacy. Pharmacy will dispense while pt remains in hospital.

## 2014-03-15 NOTE — Progress Notes (Signed)
RN notified PA of moderate amount of sanguineous drainage from surgical site. New order to change or reinforce or ace wrap per nursing discretion. Will continue to monitor closely.

## 2014-03-15 NOTE — Progress Notes (Signed)
Assumed care of pt, report received from staff RN [place].

## 2014-03-15 NOTE — Progress Notes (Signed)
Patient ID: Chad [name], male   DOB: 1955-07-23, 59 y.o.   MRN: [id number]         [facility]    Date of Admission:  03/14/2014   Prolonged antibiotics        Day 1 piperacillin tazobactam  Active Problems:   Cardiomyopathy   Atrial flutter   CKD (chronic kidney disease)   Hypertension   History of renal transplant   Osteomyelitis   Broken femur   . allopurinol  100 mg Oral BID  . amiodarone  100 mg Oral Daily  . amLODipine  2.5 mg Oral Daily  . aspirin  325 mg Oral Daily  . calcitRIOL  0.5 mcg Oral Daily  . carvedilol  25 mg Oral Daily  . Cholecalciferol  5,000 Units Oral Daily  . docusate sodium  100 mg Oral BID  . enoxaparin (LOVENOX) injection  30 mg Subcutaneous Q24H  . furosemide  80 mg Oral QPC breakfast   And  . furosemide  40 mg Oral QPC lunch  . hydrALAZINE  100 mg Oral 3 times per day  . isosorbide mononitrate  60 mg Oral Daily  . [START ON 03/16/2014] metolazone  2.5 mg Oral Once per day on Tue Thu Sat  . piperacillin-tazobactam (ZOSYN)  IV  2.25 g Intravenous 4 times per day  . potassium chloride SA  20 mEq Oral BID  . pravastatin  40 mg Oral QHS  . predniSONE  10 mg Oral Daily  . sirolimus  1.2 mg Oral q morning - 10a    Subjective: He is having some expected postoperative left hip and thigh pain.  Review of Systems: Pertinent items are noted in HPI.  Past Medical History  Diagnosis Date  . CKD (chronic kidney disease), stage IV     a. s/p renal transplant 1996.  Marland Kitchen HLD (hyperlipidemia)     takes Pravastatin daily  . Nonischemic cardiomyopathy     a. unknown etiology, EF 30-35% by echo 09/2011;  b. 09/2011 Lexi MV EF 42%, no ischemia/infarct.  . Systolic CHF     takes Lasix daily  . Atrial flutter     takes Amiodarone daily  . Gout     takes Allopurinol daily  . Hypertension     takes Amlodipine,Hydralazine,Imdur,and Coreg daily  . GERD (gastroesophageal reflux disease)     takes Protonix daily  . Shortness of  breath dyspnea     exertion  . Pneumonia 2014  . History of bronchitis     several yrs ago  . Joint pain     right shoulder and left knee  . Sciatica   . Hepatitis C 1996    Hep C  . History of colon polyps   . History of kidney stones   . Urinary frequency   . History of shingles     History  Substance Use Topics  . Smoking status: Never Smoker   . Smokeless tobacco: Never Used  . Alcohol Use: No    Family History  Problem Relation Age of Onset  . Heart disease Neg Hx    No Known Allergies  OBJECTIVE: Blood pressure 103/53, pulse 78, temperature 98.2 F (36.8 C), temperature source Oral, resp. rate 17, height 6' (1.829 m), weight 272 lb (123.378 kg), SpO2 92 %. General: He is comfortable and in no distress sitting up in bed.  Lab Results Lab Results  Component Value Date   WBC 14.6* 03/15/2014   HGB 8.2* 03/15/2014  HCT 25.5* 03/15/2014   MCV 97.7 03/15/2014   PLT 147* 03/15/2014    Lab Results  Component Value Date   CREATININE 5.04* 03/15/2014   BUN 115* 03/15/2014   NA 136 03/15/2014   K 4.1 03/15/2014   CL 96 03/15/2014   CO2 32 03/15/2014    Lab Results  Component Value Date   ALT 10 03/15/2014   AST 22 03/15/2014   ALKPHOS 28* 03/15/2014   BILITOT 0.7 03/15/2014     Microbiology: Recent Results (from the past 240 hour(s))  Anaerobic culture     Status: None (Preliminary result)   Collection Time: 03/14/14 12:40 PM  Result Value Ref Range Status   Specimen Description WOUND LEFT THIGH  Final   Special Requests NONE  Final   Gram Stain   Final    RARE WBC PRESENT, PREDOMINANTLY PMN NO SQUAMOUS EPITHELIAL CELLS SEEN NO ORGANISMS SEEN Performed at [facility]    Culture PENDING  Incomplete   Report Status PENDING  Incomplete  Gram stain     Status: None   Collection Time: 03/14/14 12:40 PM  Result Value Ref Range Status   Specimen Description WOUND LEFT THIGH  Final   Special Requests NONE  Final   Gram Stain   Final    FEW  WBC PRESENT, PREDOMINANTLY PMN NO ORGANISMS SEEN Gram Stain Report Called to,Read Back By and Verified With: D.JOHNSON,RN 03/14/14 1320 BY BSLADE    Report Status 03/14/2014 FINAL  Final  Wound culture     Status: None (Preliminary result)   Collection Time: 03/14/14 12:40 PM  Result Value Ref Range Status   Specimen Description WOUND LEFT THIGH  Final   Special Requests NONE  Final   Gram Stain   Final    FEW WBC PRESENT, PREDOMINANTLY PMN NO SQUAMOUS EPITHELIAL CELLS SEEN NO ORGANISMS SEEN Performed at [facility] Performed at [facility]    Culture   Final    NO GROWTH 1 DAY Note: Gram Stain Report Called to,Read Back By and Verified With: D Laural Benes RN 03/14/14 1320 BY BSLADE Performed at [facility]    Report Status PENDING  Incomplete  Gram stain     Status: None   Collection Time: 03/14/14  1:29 PM  Result Value Ref Range Status   Specimen Description TISSUE LEG LEFT  Final   Special Requests SPEC (C) PT ON VANCOMYCIN  Final   Gram Stain NO WBC SEEN NO ORGANISMS SEEN   Final   Report Status 03/14/2014 FINAL  Final  Anaerobic culture     Status: None (Preliminary result)   Collection Time: 03/14/14  1:29 PM  Result Value Ref Range Status   Specimen Description TISSUE LEG LEFT  Final   Special Requests NON UNION LEFT FEMUR PT ON VANCOMYCIN  Final   Gram Stain   Final    NO WBC SEEN NO ORGANISMS SEEN Performed at [facility] Performed at [facility]    Culture PENDING  Incomplete   Report Status PENDING  Incomplete  Tissue culture     Status: None (Preliminary result)   Collection Time: 03/14/14  1:29 PM  Result Value Ref Range Status   Specimen Description TISSUE LEG LEFT  Final   Special Requests NON UNION LEFT FEMUR PT ON VANCOMYCIN  Final   Gram Stain   Final    NO WBC SEEN NO ORGANISMS SEEN Performed at [facility] Performed at [facility]  Culture NO GROWTH Performed at [facility]    Final   Report Status PENDING  Incomplete  Anaerobic culture     Status: None (Preliminary result)   Collection Time: 03/14/14  1:37 PM  Result Value Ref Range Status   Specimen Description TISSUE LEG LEFT  Final   Special Requests NON UNION LEFT FEMUR SPEC (B) PT ON VANCOMYCIN  Final   Gram Stain   Final    NO WBC SEEN NO ORGANISMS SEEN Performed at [facility] Performed at [facility]    Culture PENDING  Incomplete   Report Status PENDING  Incomplete  Anaerobic culture     Status: None (Preliminary result)   Collection Time: 03/14/14  1:37 PM  Result Value Ref Range Status   Specimen Description TISSUE LEG LEFT  Final   Special Requests NON UNION LEFT FEMUR SPEC (D) PT ON VANCOMYCINA  Final   Gram Stain   Final    FEW WBC PRESENT, PREDOMINANTLY MONONUCLEAR NO ORGANISMS SEEN Performed at [facility] Performed at [facility]    Culture PENDING  Incomplete   Report Status PENDING  Incomplete  Gram stain     Status: None   Collection Time: 03/14/14  1:37 PM  Result Value Ref Range Status   Specimen Description TISSUE LEG LEFT  Final   Special Requests NON UNION LEFT FEMUR SPEC (B) PT ON VANCOMYCIN  Final   Gram Stain NO WBC SEEN NO ORGANISMS SEEN   Final   Report Status 03/14/2014 FINAL  Final  Gram stain     Status: None   Collection Time: 03/14/14  1:37 PM  Result Value Ref Range Status   Specimen Description TISSUE LEG LEFT  Final   Special Requests NON UNION LEFT FEMUR SPEC (D) PT ON VANCOMYCIN  Final   Gram Stain   Final    FEW WBC PRESENT, PREDOMINANTLY MONONUCLEAR NO ORGANISMS SEEN    Report Status 03/15/2014 FINAL  Final  Tissue culture     Status: None (Preliminary result)   Collection Time: 03/14/14  1:37 PM  Result Value Ref Range Status   Specimen Description TISSUE LEG LEFT  Final   Special Requests NON UNION LEFT FEMUR SPEC (B) PT ON VANCOMYCIN  Final   Gram Stain   Final    NO WBC SEEN NO ORGANISMS SEEN Performed  at [facility] Performed at [facility]    Culture NO GROWTH Performed at [facility]   Final   Report Status PENDING  Incomplete  Tissue culture     Status: None (Preliminary result)   Collection Time: 03/14/14  1:37 PM  Result Value Ref Range Status   Specimen Description TISSUE LEG LEFT  Final   Special Requests NON UNION LEFT FEMUR SPEC (D) PT ON VANCOMYCIN  Final   Gram Stain   Final    FEW WBC PRESENT, PREDOMINANTLY MONONUCLEAR NO ORGANISMS SEEN Performed at [facility] Performed at [facility]    Culture NO GROWTH Performed at [facility]   Final   Report Status PENDING  Incomplete    Assessment: Operative Gram stain did not reveal any organism and cultures are pending.  Plan: 1. Continue piperacillin tazobactam pending final culture results  Cliffton Asters, MD [facility] [phone] pager   [phone] cell 03/15/2014, 9:01 AM

## 2014-03-15 NOTE — Progress Notes (Signed)
Received prescreen request for inpatient rehab and have reviewed pt's case. Pt is now s/p partial excision of L distal femur and placement of antibiotic bead with a complex medical history. Pt is performing mobility skills at [facility] (NWB L LE) and based on pt's current diagnosis and level of assist needed, it is not likely that his [employer] Medicare would give authorization for inpatient rehab.  We recommend that skilled nursing or home with home health be pursued. If team would like to pursue an inpatient rehab consult, please contact me.  Thanks.  Juliann Mule, PT Rehabilitation Admissions Coordinator [phone]

## 2014-03-15 NOTE — Op Note (Signed)
Chad [name], [name] NO.:  [id number]  MEDICAL RECORD NO.:  [id number]  LOCATION:  5N10C                        FACILITY:  [facility]  PHYSICIAN:  Doralee Albino. Carola Frost, M.D. DATE OF BIRTH:  17-Jan-1956  DATE OF PROCEDURE:  03/14/2014 DATE OF DISCHARGE:                              OPERATIVE REPORT   PREOPERATIVE DIAGNOSES: 1. Left supracondylar femur nonunion. 2. Loose hardware.  POSTOPERATIVE DIAGNOSES: 1. Infected nonunion, left femur. 2. Loose hardware.  PROCEDURES: 1. Partial excision of left femur for chronic osteomyelitis. 2. Open treatment of supracondylar femur nonunion. 3. Hardware removal, left femur. 4. Insertion of antibiotic beads using a total of 29 beads with 15     beads on 1 strand and 14 beads on the other.  SURGEON:  Doralee Albino. Carola Frost, M.D.  ASSISTANT:  Mearl Latin, PA-C.  ANESTHESIA:  General.  COMPLICATIONS:  None.  I/O:  300 mL in.  EBL:  200 mL.  SPECIMENS: 1. Deep abscess. 2. Nonunion/supracondylar femur. 3. Intramedullary involucrum/sequestrum.  SPECIMENS:  To micro.  TOURNIQUET:  None.  DISPOSITION:  To PACU.  CONDITION:  Stable.  BRIEF SUMMARY AND INDICATIONS FOR PROCEDURE:  Chad [name] is a 59- year-old with multiple medical problems including hepatitis C and renal transplantation who underwent ORIF of a left femur fracture many months ago.  The patient failed to unite and continued to have persistent pain symptoms isolated to the left supracondylar region.  CT scan confirmed this.  Laboratory studies indicated an elevated CRP at 2.7 and a sed rate as well, but with multiple medical problems it is difficult ascertain whether or not there was distinct evidence of infection.  He never had any clinical evidence of infection involving the femur.  I discussed with Chad [name] the risks and benefits of surgery including the possibility of infection, nerve injury, vessel injury, DVT, PE, loss of motion, arthritis, heart  attack, stroke, and many others, and he did wish to proceed.  BRIEF SUMMARY OF PROCEDURE:  Chad [name] was taken to the operating room where general anesthesia was induced.  Antibiotics were held preoperatively until we could obtain a culture.  We very carefully prepped the leg because of the condition of his skin and soft tissues, which were quite fragile.  The incision was remade at the knee and dissection carried down to the subcutaneous tissues where at the fascial layer we incorporated a pocket of purulence.  This was sent for culture with anaerobic and aerobic swabs.  Dissection continued down through the complete fascial layer, which seemed to be of prolonged or extended depth.  Likely because of the infection, I did not encounter an additional pocket beneath the full-thickness of the fascia adjacent to the plate.  I then divided the fascia proximally and distally so that I could expose the entire plate, the heads of the screws were identified, backed out, and removed both distally in the area of the nonunion as well as a separate incision was made more proximally over the middle screws carrying that dissection down and removing the screws there. Distally, there was some loosening and pull away of the bone, but proximally the screws remained in for a large part well fixed.  Once the screws were out, an elevator was placed between the bone and the plate and gradually worked up both proximally and distally and also to remove the overlying soft tissues proximally and distally.  I then gently maneuvered the plate in rotation and then withdrew it distally.  Wound was irrigated thoroughly.  I did not encounter significant purulence along the plate.  I then placed a 10 blade into the nonunion site and confirmed this radiographically.  It was then debrided sequentially with multiple curettes.  I did send the nonunion site directly to micro for culture as well.  As I worked from lateral to  medial, I then identified what appeared to be almost a pseudarthrosis, but going through the proximal membrane and fibrinous material that was removed, we encountered a large pocket of purulence and fibrinous material within the canal.  This was quite large and again appeared infected, though not a frank abscess.  There was a host of this material and it was evacuated with curettage as well as suction and much of it sent as a large specimen down to micro as well.  Canal was then irrigated thoroughly with saline.  After takedown of the nonunion site for subsequent repair, I then placed antibiotic beads consisting of vancomycin as well as tobramycin into this cavity using 2 strands, one with 14 beads and the other with 15 beads over Prolene #2 suture.  These were impacted in order to achieve appropriate reduction of the supracondylar nonunion and to repair it as well as to provide in addition to the stability a pocket into which we could subsequently graft should the patient go on to resolve this infection with antibiotics or in the event that he chose simply to continue with suppression.  Wounds were irrigated and closed in standard layered fashion using PDS and nylon with 0, 2-0, #2, and 3-0 respectively.  Montez Morita, PA-C did assist me throughout and his assistance was absolutely necessary for the difficult and extended procedures.  PROGNOSIS:  Chad [name] remains at high risk for persistent infection given his chronic requirements for immunosuppression, multiple comorbidities.  We did not see significant evidence of healing, but we were pleased with the amount of stability that was obtained.  We will contact Infectious Disease Service and see if they can assist [place] in optimal management of Chad [name] as clear the risk to taking antibiotics as well.  He will be on pharmacologic DVT prophylaxis as well.     Doralee Albino. Carola Frost, M.D.     MHH/MEDQ  D:  03/14/2014  T:  03/15/2014  Job:   [id number]

## 2014-03-15 NOTE — Evaluation (Signed)
Occupational Therapy Evaluation Patient Details Name: Chad [name] MRN: [id number] DOB: 16-Oct-1955 Today's Date: 03/15/2014    History of Present Illness 59 y/o black male with complex medical hx including Hepatitis C and renal transplant presents for repair of L distal femur nonunion. 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 Pt sustained a ground level fall in 2014 and had numerous procedures on this. Some question as to the presence of infection. Pt remains on abx. Continues to have pain and difficulty with daily activities. Now s/p partial escision of L distal femur and placement of antibiotic beads; NWB   Clinical Impression   Patient mod I PTA. Patient currently functioning at an overall mod>max assist level. Patient will benefit from acute OT to increase overall independence in the areas of ADLs, functional mobility, overall safety in order to safely discharge to venue listed below.     Follow Up Recommendations  CIR;Supervision/Assistance - 24 hour    Equipment Recommendations   (TBD)    Recommendations for Other Services  None at this time     Precautions / Restrictions Precautions Precautions: Fall Restrictions Weight Bearing Restrictions: Yes LLE Weight Bearing: Non weight bearing      Mobility Bed Mobility  See PT eval  Transfers Overall transfer level: Needs assistance Equipment used: Rolling walker (2 wheeled) Transfers: Sit to/from Stand Sit to Stand: Mod assist         General transfer comment: required verbal cues for NWB adherence along with cues required for safety and technique     Balance Overall balance assessment: Needs assistance   Sitting balance-Leahy Scale: Good     Standing balance support: Bilateral upper extremity supported Standing balance-Leahy Scale: Poor     ADL Overall ADL's : Needs assistance/impaired Eating/Feeding: Independent;Sitting   Grooming: Set up;Sitting   Upper Body Bathing: Set up;Sitting   Lower Body  Bathing: Maximal assistance;Sit to/from stand   Upper Body Dressing : Set up;Sitting   Lower Body Dressing: Maximal assistance;Sit to/from stand   Toilet Transfer: Moderate assistance;RW;Stand-pivot           Functional mobility during ADLs: Moderate assistance;Rolling walker General ADL Comments: Patient unable to perform LB ADLs without use of AE at this time. Patient aware of AE for use, will need further education & demonstration on this. Patient required mod assist for sit<>stand and mod verbal cues in order to adhere to NWB status > LLE.     Pertinent Vitals/Pain Pain Assessment: 0-10 Pain Score: 8  Pain Location: left LE Pain Descriptors / Indicators: Aching Pain Intervention(s): Monitored during session;Patient requesting pain meds-RN notified     Hand Dominance Right   Extremity/Trunk Assessment Upper Extremity Assessment Upper Extremity Assessment: Generalized weakness   Lower Extremity Assessment Lower Extremity Assessment: Defer to PT evaluation   Cervical / Trunk Assessment Cervical / Trunk Assessment: Normal   Communication Communication Communication: No difficulties   Cognition Arousal/Alertness: Awake/alert Behavior During Therapy: WFL for tasks assessed/performed Overall Cognitive Status: Within Functional Limits for tasks assessed             Home Living Family/patient expects to be discharged to:: Private residence Living Arrangements: Alone Available Help at Discharge: Family;Available PRN/intermittently (son) Type of Home: House       Home Layout: One level     Bathroom Shower/Tub: Walk-in shower;Door   Bathroom Toilet: Handicapped height     Home Equipment: Environmental consultant - 2 wheels;Walker - 4 wheels;Shower seat;Wheelchair - manual;Bedside commode    Prior Functioning/Environment Level of  Independence: Independent with assistive device(s)      OT Diagnosis: Generalized weakness;Acute pain   OT Problem List: Decreased  strength;Decreased activity tolerance;Impaired balance (sitting and/or standing);Decreased coordination;Decreased safety awareness;Decreased knowledge of use of DME or AE;Decreased knowledge of precautions;Pain   OT Treatment/Interventions: Self-care/ADL training;Therapeutic exercise;Energy conservation;DME and/or AE instruction;Therapeutic activities;Patient/family education;Balance training    OT Goals(Current goals can be found in the care plan section) Acute Rehab OT Goals OT Goal Formulation: With patient Time For Goal Achievement: 03/22/14 Potential to Achieve Goals: Good ADL Goals Pt Will Perform Lower Body Bathing: with min assist;sit to/from stand;with adaptive equipment Pt Will Perform Lower Body Dressing: with min assist;with adaptive equipment;sit to/from stand Pt Will Transfer to Toilet: with min assist;bedside commode;stand pivot transfer Pt Will Perform Toileting - Clothing Manipulation and hygiene: with min assist;sit to/from stand Pt Will Perform Tub/Shower Transfer: Shower transfer;with min assist;rolling walker;shower seat;Stand pivot transfer Additional ADL Goal #1: Patient will independently adhere to NWB >LLE status 100% of the time  OT Frequency: Min 2X/week   Barriers to D/C: None known at this time          End of Session Equipment Utilized During Treatment: Rolling walker;Gait belt  Activity Tolerance: Patient tolerated treatment well Patient left: in chair;with call bell/phone within reach   Time: 1203-1223 OT Time Calculation (min): 20 min Charges:  OT General Charges $OT Visit: 1 Procedure OT Evaluation $Initial OT Evaluation Tier I: 1 Procedure  Kelena Garrow , MS, OTR/L, CLT Pager: [phone]  03/15/2014, 2:43 PM

## 2014-03-15 NOTE — Progress Notes (Signed)
Orthopedic Tech Progress Note Patient Details:  Chad [name] [birth year] [id number] Patient did not want ohf says he is able to pull himself up. Patient ID: Chad [name], male   DOB: 03-05-1955, 59 y.o.   MRN: [id number]   [name] 03/15/2014, 8:52 PM

## 2014-03-15 NOTE — Progress Notes (Signed)
VASCULAR LAB PRELIMINARY  ARTERIAL  ABI completed: Within normal limits    RIGHT    LEFT    PRESSURE WAVEFORM  PRESSURE WAVEFORM  BRACHIAL 119 Tri BRACHIAL 110 Tri  DP   DP    AT 110 Tri AT 113 Tri  PT 123 Tri PT 119 Tri  PER   PER    GREAT TOE  NA GREAT TOE  NA    RIGHT LEFT  ABI 1.03 1.0     Farrel Demark, RDMS, RVT  03/15/2014, 1:28 PM

## 2014-03-15 NOTE — Progress Notes (Addendum)
[name] Consult Note   Chad [name] ZOX:[id number] DOB: [birth year] DOA: 03/14/2014 PCP: [name], MD  Brief narrative: Chad [name] is a 59 y.o. male with CK D stage V status post renal transplant on immunosuppressive therapy, nonischemic cardiomyopathy with an EF of 30-35%, a flutter, hepatitis C as a result of the renal transplant, hypertension admitted to the orthopedic service and underwent partial excision of the left femur for chronic osteomyelitis, removal of hardware of-left femur and insertion of antibiotic beads. The hospitalist group has been asked to manage medical problems.   Subjective: He states he is feeling quite well today and the best she's felt in the number of days. He has no complaints of shortness of breath or pain.  Assessment/Plan: Status post hardware removal and antibiotic bead with placement left leg ID recommends to continue Zosyn- noted that vancomycin has also been ordered and I will discontinue this especially as his creatinine has been noted to be getting worse today.  Acute on Chronic kidney disease stage IV Creatinine of 4.7 increasing to 5.04 today -Last creatinine checked in August 2015 was 3.95 -appears to be over diuresed- will hold diuretics - he received his dose of lasix for this morning already - drop in Hb and hypotension may be contributing to worsening renal function - will start slow IVF at 50 cc/hr -Vancomycin on hold -If creatinine continues to increase, we will need to ask for a nephrology eval  Anemia- etiology uncertain but may be related to acute blood loss -Hemoglobin dropping from 10.4-8.2 this morning - we'll continue to follow for further drop in hemoglobin-no need to transfuse blood at this time -We'll obtain an anemia profile in the morning  Hx of renal transplant continue rapamune and prednisone  Nonischemic cardiomyopathy hold Lasix and Zaroxolyn. Continue aspirin and statin. Monitor on  telemetry.  Hyperlipidemia Continue statin  A flutter Rate control. Not on anticoagulation. Continue amiodarone.  Hypertension Continue amlodipine and Coreg  Gout Continue allopurinol  GERD Continue PPI    Code Status: *Full code Family Communication:  Disposition Plan: To be determined-based on ortho suggestions DVT prophylaxis: Per orthopedics  Antibiotics: Anti-infectives    Start     Dose/Rate Route Frequency Ordered Stop   03/14/14 2200  vancomycin (VANCOCIN) 1,500 mg in sodium chloride 0.9 % 500 mL IVPB     1,500 mg 250 mL/hr over 120 Minutes Intravenous  Once 03/14/14 2021 03/15/14 0110   03/14/14 2100  piperacillin-tazobactam (ZOSYN) IVPB 2.25 g     2.25 g 100 mL/hr over 30 Minutes Intravenous 4 times per day 03/14/14 2021     03/14/14 1421  vancomycin (VANCOCIN) powder  Status:  Discontinued       As needed 03/14/14 1421 03/14/14 1521   03/14/14 1414  tobramycin (NEBCIN) powder  Status:  Discontinued       As needed 03/14/14 1415 03/14/14 1521   03/14/14 1330  vancomycin (VANCOCIN) IVPB 1000 mg/200 mL premix     1,000 mg 200 mL/hr over 60 Minutes Intravenous To Surgery 03/14/14 1323 03/14/14 1328         Objective: Filed Weights   03/14/14 0931  Weight: 123.378 kg (272 lb)    Intake/Output Summary (Last 24 hours) at 03/15/14 1057 Last data filed at 03/14/14 2200  Gross per 24 hour  Intake    300 ml  Output    300 ml  Net      0 ml     Vitals Filed Vitals:  03/14/14 2018 03/15/14 0109 03/15/14 0509 03/15/14 0641  BP: 102/58 123/72 103/56 103/53  Pulse: 85 79 78   Temp: 97.7 F (36.5 C) 97.7 F (36.5 C) 98.2 F (36.8 C)   TempSrc:      Resp: 17 17 17    Height:      Weight:      SpO2: 98% 99% 92%     Exam: General: Awake alert oriented 3, No acute respiratory distress Lungs: Clear to auscultation bilaterally without wheezes or crackles Cardiovascular: Regular rate and rhythm without murmur gallop or rub normal S1 and S2 Abdomen:  Nontender, nondistended, soft, bowel sounds positive, no rebound, no ascites, no appreciable mass Extremities: No significant cyanosis, clubbing, or edema bilateral lower extremities- left leg and foot wrapped in Ace wrap  Data Reviewed: Basic Metabolic Panel:  Recent Labs Lab 03/14/14 0932 03/14/14 2119 03/15/14 0544  NA  --   --  136  K  --   --  4.1  CL  --   --  96  CO2  --   --  32  GLUCOSE  --   --  158*  BUN  --   --  115*  CREATININE  --  4.94* 5.04*  CALCIUM 9.4  --  8.2*   Liver Function Tests:  Recent Labs Lab 03/15/14 0544  AST 22  ALT 10  ALKPHOS 28*  BILITOT 0.7  PROT 5.6*  ALBUMIN 2.2*   No results for input(s): LIPASE, AMYLASE in the last 168 hours. No results for input(s): AMMONIA in the last 168 hours. CBC:  Recent Labs Lab 03/14/14 2119 03/15/14 0544  WBC 18.3* 14.6*  HGB 10.4* 8.2*  HCT 32.5* 25.5*  MCV 98.5 97.7  PLT 182 147*   Cardiac Enzymes: No results for input(s): CKTOTAL, CKMB, CKMBINDEX, TROPONINI in the last 168 hours. BNP (last 3 results) No results for input(s): BNP in the last 8760 hours.  ProBNP (last 3 results) No results for input(s): PROBNP in the last 8760 hours.  CBG: No results for input(s): GLUCAP in the last 168 hours.  Recent Results (from the past 240 hour(s))  Anaerobic culture     Status: None (Preliminary result)   Collection Time: 03/14/14 12:40 PM  Result Value Ref Range Status   Specimen Description WOUND LEFT THIGH  Final   Special Requests NONE  Final   Gram Stain   Final    RARE WBC PRESENT, PREDOMINANTLY PMN NO SQUAMOUS EPITHELIAL CELLS SEEN NO ORGANISMS SEEN Performed at [facility]    Culture   Final    NO ANAEROBES ISOLATED; CULTURE IN PROGRESS FOR 5 DAYS Performed at [facility]    Report Status PENDING  Incomplete  Gram stain     Status: None   Collection Time: 03/14/14 12:40 PM  Result Value Ref Range Status   Specimen Description WOUND LEFT THIGH  Final   Special  Requests NONE  Final   Gram Stain   Final    FEW WBC PRESENT, PREDOMINANTLY PMN NO ORGANISMS SEEN Gram Stain Report Called to,Read Back By and Verified With: D.JOHNSON,RN 03/14/14 1320 BY BSLADE    Report Status 03/14/2014 FINAL  Final  Wound culture     Status: None (Preliminary result)   Collection Time: 03/14/14 12:40 PM  Result Value Ref Range Status   Specimen Description WOUND LEFT THIGH  Final   Special Requests NONE  Final   Gram Stain   Final    FEW WBC PRESENT, PREDOMINANTLY  PMN NO SQUAMOUS EPITHELIAL CELLS SEEN NO ORGANISMS SEEN Performed at [facility] Performed at [facility]    Culture   Final    NO GROWTH 1 DAY Note: Gram Stain Report Called to,Read Back By and Verified With: D Laural Benes RN 03/14/14 1320 BY BSLADE Performed at [facility]    Report Status PENDING  Incomplete  Gram stain     Status: None   Collection Time: 03/14/14  1:29 PM  Result Value Ref Range Status   Specimen Description TISSUE LEG LEFT  Final   Special Requests SPEC (C) PT ON VANCOMYCIN  Final   Gram Stain NO WBC SEEN NO ORGANISMS SEEN   Final   Report Status 03/14/2014 FINAL  Final  Anaerobic culture     Status: None (Preliminary result)   Collection Time: 03/14/14  1:29 PM  Result Value Ref Range Status   Specimen Description TISSUE LEG LEFT  Final   Special Requests NON UNION LEFT FEMUR PT ON VANCOMYCIN  Final   Gram Stain   Final    NO WBC SEEN NO ORGANISMS SEEN Performed at [facility] Performed at [facility]    Culture   Final    NO ANAEROBES ISOLATED; CULTURE IN PROGRESS FOR 5 DAYS Performed at [facility]    Report Status PENDING  Incomplete  Tissue culture     Status: None (Preliminary result)   Collection Time: 03/14/14  1:29 PM  Result Value Ref Range Status   Specimen Description TISSUE LEG LEFT  Final   Special Requests NON UNION LEFT FEMUR PT ON VANCOMYCIN  Final   Gram Stain   Final    NO WBC SEEN NO ORGANISMS  SEEN Performed at [facility] Performed at [facility]    Culture NO GROWTH Performed at [facility]   Final   Report Status PENDING  Incomplete  Anaerobic culture     Status: None (Preliminary result)   Collection Time: 03/14/14  1:37 PM  Result Value Ref Range Status   Specimen Description TISSUE LEG LEFT  Final   Special Requests NON UNION LEFT FEMUR SPEC (B) PT ON VANCOMYCIN  Final   Gram Stain   Final    NO WBC SEEN NO ORGANISMS SEEN Performed at [facility] Performed at [facility]    Culture   Final    NO ANAEROBES ISOLATED; CULTURE IN PROGRESS FOR 5 DAYS Performed at [facility]    Report Status PENDING  Incomplete  Anaerobic culture     Status: None (Preliminary result)   Collection Time: 03/14/14  1:37 PM  Result Value Ref Range Status   Specimen Description TISSUE LEG LEFT  Final   Special Requests NON UNION LEFT FEMUR SPEC (D) PT ON VANCOMYCINA  Final   Gram Stain   Final    FEW WBC PRESENT, PREDOMINANTLY MONONUCLEAR NO ORGANISMS SEEN Performed at [facility] Performed at [facility]    Culture   Final    NO ANAEROBES ISOLATED; CULTURE IN PROGRESS FOR 5 DAYS Performed at [facility]    Report Status PENDING  Incomplete  Gram stain     Status: None   Collection Time: 03/14/14  1:37 PM  Result Value Ref Range Status   Specimen Description TISSUE LEG LEFT  Final   Special Requests NON UNION LEFT FEMUR SPEC (B) PT ON VANCOMYCIN  Final   Gram Stain NO WBC SEEN NO ORGANISMS  SEEN   Final   Report Status 03/14/2014 FINAL  Final  Gram stain     Status: None   Collection Time: 03/14/14  1:37 PM  Result Value Ref Range Status   Specimen Description TISSUE LEG LEFT  Final   Special Requests NON UNION LEFT FEMUR SPEC (D) PT ON VANCOMYCIN  Final   Gram Stain   Final    FEW WBC PRESENT, PREDOMINANTLY MONONUCLEAR NO ORGANISMS SEEN    Report Status 03/15/2014 FINAL  Final  Tissue  culture     Status: None (Preliminary result)   Collection Time: 03/14/14  1:37 PM  Result Value Ref Range Status   Specimen Description TISSUE LEG LEFT  Final   Special Requests NON UNION LEFT FEMUR SPEC (B) PT ON VANCOMYCIN  Final   Gram Stain   Final    NO WBC SEEN NO ORGANISMS SEEN Performed at [facility] Performed at [facility]    Culture NO GROWTH Performed at [facility]   Final   Report Status PENDING  Incomplete  Tissue culture     Status: None (Preliminary result)   Collection Time: 03/14/14  1:37 PM  Result Value Ref Range Status   Specimen Description TISSUE LEG LEFT  Final   Special Requests NON UNION LEFT FEMUR SPEC (D) PT ON VANCOMYCIN  Final   Gram Stain   Final    FEW WBC PRESENT, PREDOMINANTLY MONONUCLEAR NO ORGANISMS SEEN Performed at [facility] Performed at [facility]    Culture NO GROWTH Performed at [facility]   Final   Report Status PENDING  Incomplete     Studies:  Recent x-ray studies have been reviewed in detail by the Attending Physician  Scheduled Meds:  Scheduled Meds: . allopurinol  100 mg Oral BID  . amiodarone  100 mg Oral Daily  . amLODipine  2.5 mg Oral Daily  . aspirin  325 mg Oral Daily  . calcitRIOL  0.5 mcg Oral Daily  . carvedilol  25 mg Oral Daily  . Cholecalciferol  5,000 Units Oral Daily  . docusate sodium  100 mg Oral BID  . enoxaparin (LOVENOX) injection  30 mg Subcutaneous Q24H  . furosemide  80 mg Oral QPC breakfast   And  . furosemide  40 mg Oral QPC lunch  . hydrALAZINE  100 mg Oral 3 times per day  . isosorbide mononitrate  60 mg Oral Daily  . [START ON 03/16/2014] metolazone  2.5 mg Oral Once per day on Tue Thu Sat  . piperacillin-tazobactam (ZOSYN)  IV  2.25 g Intravenous 4 times per day  . potassium chloride SA  20 mEq Oral BID  . pravastatin  40 mg Oral QHS  . predniSONE  10 mg Oral Daily  . sirolimus  1.2 mg Oral q morning - 10a   Continuous  Infusions:   Time spent on care of this patient: 45 min   Yvonne Petite, MD 03/15/2014, 10:57 AM  LOS: 1 day   [name] Office  [phone] Pager - Text Page per [email or web address]  If 7PM-7AM, please contact night-coverage [email or web address]

## 2014-03-15 NOTE — Evaluation (Signed)
Physical Therapy Evaluation Patient Details Name: Chad [name] MRN: [id number] DOB: 05-08-1955 Today's Date: 03/15/2014   History of Present Illness  59 y/o black male with complex medical hx including Hepatitis C and renal transplant presents for repair of L distal femur nonunion. 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 Pt sustained a ground level fall in 2014 and had numerous procedures on this. Some question as to the presence of infection. Pt remains on abx. Continues to have pain and difficulty with daily activities. Now s/p partial escision of L distal femur and placement of antibiotic beads; NWB  Past Medical History  Diagnosis Date  . CKD (chronic kidney disease), stage IV     a. s/p renal transplant 1996.  Marland Kitchen HLD (hyperlipidemia)     takes Pravastatin daily  . Nonischemic cardiomyopathy     a. unknown etiology, EF 30-35% by echo 09/2011;  b. 09/2011 Lexi MV EF 42%, no ischemia/infarct.  . Systolic CHF     takes Lasix daily  . Atrial flutter     takes Amiodarone daily  . Gout     takes Allopurinol daily  . Hypertension     takes Amlodipine,Hydralazine,Imdur,and Coreg daily  . GERD (gastroesophageal reflux disease)     takes Protonix daily  . Shortness of breath dyspnea     exertion  . Pneumonia 2014  . History of bronchitis     several yrs ago  . Joint pain     right shoulder and left knee  . Sciatica   . Hepatitis C 1996    Hep C  . History of colon polyps   . History of kidney stones   . Urinary frequency   . History of shingles    Past Surgical History  Procedure Laterality Date  . Nephrectomy transplanted organ Right 33yrs ago  . Insertion of dialysis catheter  06/20/2011    Procedure: INSERTION OF DIALYSIS CATHETER;  Surgeon: [name], MD;  Location: [facility] OR;  Service: Vascular;  Laterality: Right;  Ultrasound guided insertion of right internal jugular dialysis catheter  . Multiple extractions with alveoloplasty  06/27/2011    Procedure: MULTIPLE EXTRACION  WITH ALVEOLOPLASTY;  Surgeon: [name], DDS;  Location: [facility] OR;  Service: Oral Surgery;  Laterality: N/A;  Extraction  of tooth # 14 with alveoloplasty  . Hip surgery Left     replacement  . Joint replacement      left hip  . Femur fracture surgery Left 07/09/2012    Dr Deno Etienne  . Femur im nail Left 07/10/2012    ORIF L distal femur;  Surgeon: Toni Arthurs, MD;  Location: [facility] OR;  Service: Orthopedics;  Laterality: Left;  . I&d extremity Left 08/24/2012    Procedure: LEFT LEG IRRIGATION AND DEBRIDEMENT WITH POSSIBLE WOUND VAC APPLICATION;  Surgeon: Toni Arthurs, MD;  Location: [facility] OR;  Service: Orthopedics;  Laterality: Left;  . Tracheostomy  2014  . Tracheostomy closure  2014    2 wks after it was done  . Colonsocopy       Clinical Impression   Patient is s/p above surgery resulting in functional limitations due to the deficits listed below (see PT Problem List).   Pt is very motivated and relatively well-versed hospital stays, and rehab progression; I believe with intensive therapies he will be able to reach modified independent functional level; Patient will benefit from skilled PT to increase their independence and safety with mobility to allow discharge to the venue listed below.  Follow Up Recommendations CIR    Equipment Recommendations  None recommended by PT (pretty well-equipped; may consider tub transfer bench)    Recommendations for Other Services OT consult     Precautions / Restrictions Precautions Precautions: Fall Restrictions Weight Bearing Restrictions: Yes LLE Weight Bearing: Non weight bearing      Mobility  Bed Mobility Overal bed mobility: Needs Assistance Bed Mobility: Supine to Sit     Supine to sit: Min assist     General bed mobility comments: Min assist to lower LLE to floor  Transfers Overall transfer level: Needs assistance Equipment used: Rolling walker (2 wheeled) Transfers: Sit to/from Stand Sit to Stand: Min assist          General transfer comment: minsteady assist from higher surface; difficulty with rise with all body weight on RLE form lower surface  Ambulation/Gait Ambulation/Gait assistance: Min assist Ambulation Distance (Feet):  (pivot "heel-toe steps" on R foot bed to recliner) Assistive device: Rolling walker (2 wheeled)       General Gait Details: Difficulty supporting full body weight in RW to [place] RLE for stepping  Stairs            Wheelchair Mobility    Modified Rankin (Stroke Patients Only)       Balance Overall balance assessment: Needs assistance   Sitting balance-Leahy Scale: Good     Standing balance support: Bilateral upper extremity supported Standing balance-Leahy Scale: Poor                               Pertinent Vitals/Pain Pain Assessment: Faces Faces Pain Scale: Hurts little more Pain Location: L knee with motion Pain Descriptors / Indicators: Grimacing;Discomfort Pain Intervention(s): Monitored during session;Repositioned    Home Living Family/patient expects to be discharged to:: Private residence Living Arrangements: Children Available Help at Discharge: Family;Available PRN/intermittently Type of Home: House Home Access: Stairs to enter;Ramped entrance (pt called to setup ramp to back entrance)     Home Layout: One level Home Equipment: Walker - 2 wheels;Walker - 4 wheels;Shower seat;Wheelchair - manual      Prior Function Level of Independence: Independent with assistive device(s)               Hand Dominance        Extremity/Trunk Assessment   Upper Extremity Assessment: Generalized weakness (Difficulty keeping NWB LLE)           Lower Extremity Assessment: LLE deficits/detail;Generalized weakness   LLE Deficits / Details: Grossly decr AROM and strngth, limited by pain; light touch intact at toes     Communication   Communication: No difficulties  Cognition Arousal/Alertness: Awake/alert Behavior  During Therapy: WFL for tasks assessed/performed Overall Cognitive Status: Within Functional Limits for tasks assessed                      General Comments General comments (skin integrity, edema, etc.): SATURATION QUALIFICATIONS: (This note is used to comply with regulatory documentation for home oxygen)  Patient Saturations on Room Air at Rest = 87%  Patient Saturations on [employer] with activity = 85%  Patient Saturations on 3 Liters of oxygen with activity = 95%  Please briefly explain why patient needs home oxygen: Patient requires supplemental oxygen to maintain oxygen saturation at safe, acceptable levels during activity.     Exercises        Assessment/Plan    PT Assessment Patient needs continued PT  services  PT Diagnosis Difficulty walking;Generalized weakness;Acute pain   PT Problem List Decreased strength;Decreased range of motion;Decreased activity tolerance;Decreased balance;Decreased mobility;Decreased coordination;Decreased knowledge of use of DME;Pain;Cardiopulmonary status limiting activity  PT Treatment Interventions DME instruction;Gait training;Functional mobility training;Therapeutic activities;Therapeutic exercise;Patient/family education   PT Goals (Current goals can be found in the Care Plan section) Acute Rehab PT Goals Patient Stated Goal: would like to be stronger before going home PT Goal Formulation: With patient Time For Goal Achievement: 03/29/14 Potential to Achieve Goals: Good    Frequency Min 5X/week   Barriers to discharge Decreased caregiver support Son lives with pt, but pt states he is often out; He will need to be modified independent in order to safely DC home    Co-evaluation               End of Session Equipment Utilized During Treatment: Oxygen Activity Tolerance: Patient tolerated treatment well Patient left: in chair;with call bell/phone within reach Nurse Communication: Mobility status         Time:  9509-3267 PT Time Calculation (min) (ACUTE ONLY): 40 min   Charges:   PT Evaluation $Initial PT Evaluation Tier I: 1 Procedure PT Treatments $Gait Training: 8-22 mins $Therapeutic Activity: 8-22 mins   PT G Codes:        Olen Pel 03/15/2014, 9:37 AM  Van Clines, PT  Acute Rehabilitation Services Pager [phone] Office [phone]

## 2014-03-15 NOTE — Progress Notes (Signed)
Utilization review completed. Foch Rosenwald, RN, BSN. 

## 2014-03-15 NOTE — Progress Notes (Signed)
[facility] Progress Note  Subjective  Doing ok this am Pain controlled No specific complaints  Appears to be in good spirits   Review of Systems  Constitutional: Negative for fever and chills.  Respiratory: Negative for shortness of breath and wheezing.   Cardiovascular: Negative for chest pain and palpitations.  Gastrointestinal: Negative for nausea, vomiting and abdominal pain.  Neurological: Negative for headaches.     Objective   BP 103/53 mmHg  Pulse 78  Temp(Src) 98.2 F (36.8 C) (Oral)  Resp 17  Ht 6' (1.829 m)  Wt 123.378 kg (272 lb)  BMI 36.88 kg/m2  SpO2 92%  Intake/Output      02/16 0701 - 02/17 0700 02/17 0701 - 02/18 0700   I.V. (mL/kg) 300 (2.4)    Total Intake(mL/kg) 300 (2.4)    Urine (mL/kg/hr) 250    Blood 50    Total Output 300     Net 0          Urine Occurrence 1 x      Labs  Results for HULAN, [id number] (MRN [id number]) as of 03/15/2014 08:31  Ref. Range 03/15/2014 05:44  Sodium Latest Range: 135-145 mmol/L 136  Potassium Latest Range: 3.5-5.1 mmol/L 4.1  Chloride Latest Range: 96-112 mmol/L 96  CO2 Latest Range: 19-32 mmol/L 32  BUN Latest Range: 6-23 mg/dL 409 (H)  Creatinine Latest Range: 0.50-1.35 mg/dL 8.11 (H)  Calcium Latest Range: 8.4-10.5 mg/dL 8.2 (L)  GFR calc non Af Amer Latest Range: >90 mL/min 12 (L)  GFR calc Af Amer Latest Range: >90 mL/min 13 (L)  Glucose Latest Range: 70-99 mg/dL 914 (H)  Anion gap Latest Range: 5-15  8  Alkaline Phosphatase Latest Range: 39-117 U/L 28 (L)  Albumin Latest Range: 3.5-5.2 g/dL 2.2 (L)  AST Latest Range: 0-37 U/L 22  ALT Latest Range: 0-53 U/L 10  Total Protein Latest Range: 6.0-8.3 g/dL 5.6 (L)  Total Bilirubin Latest Range: 0.3-1.2 mg/dL 0.7  WBC Latest Range: 4.0-10.5 K/uL 14.6 (H)  RBC Latest Range: 4.22-5.81 MIL/uL 2.61 (L)  Hemoglobin Latest Range: 13.0-17.0 g/dL 8.2 (L)  HCT Latest Range: 39.0-52.0 % 25.5 (L)  MCV Latest Range: 78.0-100.0 fL 97.7  MCH  Latest Range: 26.0-34.0 pg 31.0  MCHC Latest Range: 30.0-36.0 g/dL 78.2  RDW Latest Range: 11.5-15.5 % 16.7 (H)  Platelets Latest Range: 150-400 K/uL 147 (L)    Exam  Gen: awake and alert, reading newspaper in bed, NAD Lungs: clear anterior fields Cardiac: s1 and s2 Abd: + BS, NTND Ext:       Left Lower Extremity   Dressing stable  + Swelling distally   DPN, SPN, TN sensation grossly intact  EHL, FHL, AT, PT, peroneals, gastroc motor intact  Ext warm      Assessment and Plan   POD/HD#: 1   59 y/o chronically ill black male with 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 Medical issues  Appreciate medicine assistance with management of chronic medical issues  Home meds  3. DVT/PE prophylaxis  Lovenox to to start today   4. FEN   Advance  diet as tolerated  5. Pain   Continue with current pain regimen   6. Dispo  F/u on cultures   Continue with current management      Mearl Latin, PA-C [facility] [phone] (O) 03/15/2014 8:30 AM

## 2014-03-16 ENCOUNTER — Encounter (HOSPITAL_COMMUNITY): Payer: Self-pay | Admitting: Orthopedic Surgery

## 2014-03-16 DIAGNOSIS — N179 Acute kidney failure, unspecified: Secondary | ICD-10-CM

## 2014-03-16 DIAGNOSIS — I4892 Unspecified atrial flutter: Secondary | ICD-10-CM

## 2014-03-16 LAB — FOLATE: FOLATE: 7.2 ng/mL

## 2014-03-16 LAB — URINALYSIS, ROUTINE W REFLEX MICROSCOPIC
Bilirubin Urine: NEGATIVE
Glucose, UA: NEGATIVE mg/dL
Hgb urine dipstick: NEGATIVE
Ketones, ur: NEGATIVE mg/dL
Leukocytes, UA: NEGATIVE
NITRITE: NEGATIVE
PROTEIN: NEGATIVE mg/dL
Specific Gravity, Urine: 1.016 (ref 1.005–1.030)
UROBILINOGEN UA: 0.2 mg/dL (ref 0.0–1.0)
pH: 5 (ref 5.0–8.0)

## 2014-03-16 LAB — WOUND CULTURE: Culture: NO GROWTH

## 2014-03-16 LAB — FERRITIN: Ferritin: 240 ng/mL (ref 22–322)

## 2014-03-16 LAB — CBC
HCT: 20.7 % — ABNORMAL LOW (ref 39.0–52.0)
Hemoglobin: 6.7 g/dL — CL (ref 13.0–17.0)
MCH: 31.8 pg (ref 26.0–34.0)
MCHC: 32.4 g/dL (ref 30.0–36.0)
MCV: 98.1 fL (ref 78.0–100.0)
PLATELETS: 120 10*3/uL — AB (ref 150–400)
RBC: 2.11 MIL/uL — AB (ref 4.22–5.81)
RDW: 16.7 % — AB (ref 11.5–15.5)
WBC: 15.6 10*3/uL — AB (ref 4.0–10.5)

## 2014-03-16 LAB — TYPE AND SCREEN
ABO/RH(D): B POS
Antibody Screen: NEGATIVE

## 2014-03-16 LAB — PREPARE RBC (CROSSMATCH)

## 2014-03-16 LAB — BASIC METABOLIC PANEL
Anion gap: 9 (ref 5–15)
BUN: 125 mg/dL — ABNORMAL HIGH (ref 6–23)
CHLORIDE: 96 mmol/L (ref 96–112)
CO2: 29 mmol/L (ref 19–32)
CREATININE: 5.75 mg/dL — AB (ref 0.50–1.35)
Calcium: 7.6 mg/dL — ABNORMAL LOW (ref 8.4–10.5)
GFR calc Af Amer: 11 mL/min — ABNORMAL LOW (ref >90)
GFR, EST NON AFRICAN AMERICAN: 10 mL/min — AB (ref >90)
Glucose, Bld: 122 mg/dL — ABNORMAL HIGH (ref 70–99)
Potassium: 4.5 mmol/L (ref 3.5–5.1)
SODIUM: 134 mmol/L — AB (ref 135–145)

## 2014-03-16 LAB — IRON AND TIBC
IRON: 30 ug/dL — AB (ref 42–165)
Saturation Ratios: 17 % — ABNORMAL LOW (ref 20–55)
TIBC: 180 ug/dL — ABNORMAL LOW (ref 215–435)
UIBC: 150 ug/dL (ref 125–400)

## 2014-03-16 LAB — RETICULOCYTES
RBC.: 2.11 MIL/uL — ABNORMAL LOW (ref 4.22–5.81)
RETIC CT PCT: 6.4 % — AB (ref 0.4–3.1)
Retic Count, Absolute: 135 10*3/uL (ref 19.0–186.0)

## 2014-03-16 LAB — VITAMIN B12: Vitamin B-12: 264 pg/mL (ref 211–911)

## 2014-03-16 MED ORDER — SODIUM CHLORIDE 0.9 % IV SOLN
Freq: Once | INTRAVENOUS | Status: AC
  Administered 2014-03-16: 09:00:00 via INTRAVENOUS

## 2014-03-16 MED ORDER — DIPHENHYDRAMINE HCL 25 MG PO CAPS
25.0000 mg | ORAL_CAPSULE | Freq: Once | ORAL | Status: AC
  Administered 2014-03-16: 25 mg via ORAL
  Filled 2014-03-16: qty 1

## 2014-03-16 MED ORDER — ACETAMINOPHEN 325 MG PO TABS
650.0000 mg | ORAL_TABLET | Freq: Once | ORAL | Status: AC
  Administered 2014-03-16: 650 mg via ORAL
  Filled 2014-03-16: qty 2

## 2014-03-16 MED ORDER — FUROSEMIDE 10 MG/ML IJ SOLN
20.0000 mg | Freq: Once | INTRAMUSCULAR | Status: AC
  Administered 2014-03-17: 20 mg via INTRAVENOUS
  Filled 2014-03-16: qty 2

## 2014-03-16 NOTE — Anesthesia Postprocedure Evaluation (Signed)
  Anesthesia Post-op Note  Patient: Chad [name]  Procedure(s) Performed: Procedure(s) with comments: HARDWARE REMOVAL LEFT (Left) DEBRIDEMENT WOUND (Left) - INSERTION OF  ANTIBIOTIC Beads  (2 strands: 15 beads and 14 beads)  Patient Location: PACU  Anesthesia Type:General  Level of Consciousness: awake, alert , oriented and patient cooperative  Airway and Oxygen Therapy: Patient Spontanous Breathing and Patient connected to nasal cannula oxygen  Post-op Pain: mild  Post-op Assessment: Post-op Vital signs reviewed, Patient's Cardiovascular Status Stable, Respiratory Function Stable, Patent Airway, No signs of Nausea or vomiting and Pain level controlled  Post-op Vital Signs: Reviewed and stable  Last Vitals:  Filed Vitals:   03/16/14 0644  BP:   Pulse: 69  Temp: 36.9 C  Resp:     Complications: No apparent anesthesia complications

## 2014-03-16 NOTE — Progress Notes (Signed)
Patient ID: Chad [name], male   DOB: November 13, 1955, 59 y.o.   MRN: [id number]         [facility] for Infectious Disease    Date of Admission:  03/14/2014   Prolonged antibiotics        Day 2 piperacillin tazobactam and vancomycin  Active Problems:   Cardiomyopathy   Atrial flutter   CKD (chronic kidney disease)   Hypertension   History of renal transplant   Osteomyelitis   Broken femur   . allopurinol  100 mg Oral BID  . amiodarone  100 mg Oral Daily  . calcitRIOL  0.5 mcg Oral Daily  . Cholecalciferol  5,000 Units Oral Daily  . docusate sodium  100 mg Oral BID  . enoxaparin (LOVENOX) injection  30 mg Subcutaneous Q24H  . furosemide  20 mg Intravenous Once  . isosorbide mononitrate  60 mg Oral Daily  . piperacillin-tazobactam (ZOSYN)  IV  2.25 g Intravenous 4 times per day  . pravastatin  40 mg Oral QHS  . predniSONE  10 mg Oral Daily  . sirolimus  1.2 mg Oral q morning - 10a    Subjective: He is feeling well. He denies any pain.  Review of Systems: Pertinent items are noted in HPI.  Past Medical History  Diagnosis Date  . CKD (chronic kidney disease), stage IV     a. s/p renal transplant 1996.  Marland Kitchen HLD (hyperlipidemia)     takes Pravastatin daily  . Nonischemic cardiomyopathy     a. unknown etiology, EF 30-35% by echo 09/2011;  b. 09/2011 Lexi MV EF 42%, no ischemia/infarct.  . Systolic CHF     takes Lasix daily  . Atrial flutter     takes Amiodarone daily  . Gout     takes Allopurinol daily  . Hypertension     takes Amlodipine,Hydralazine,Imdur,and Coreg daily  . GERD (gastroesophageal reflux disease)     takes Protonix daily  . Shortness of breath dyspnea     exertion  . Pneumonia 2014  . History of bronchitis     several yrs ago  . Joint pain     right shoulder and left knee  . Sciatica   . Hepatitis C 1996    Hep C  . History of colon polyps   . History of kidney stones   . Urinary frequency   . History of shingles     History    Substance Use Topics  . Smoking status: Never Smoker   . Smokeless tobacco: Never Used  . Alcohol Use: No    Family History  Problem Relation Age of Onset  . Heart disease Neg Hx    No Known Allergies  OBJECTIVE: Blood pressure 110/72, pulse 71, temperature 98.4 F (36.9 C), temperature source Oral, resp. rate 14, height 6' (1.829 m), weight 272 lb (123.378 kg), SpO2 96 %. General: He is comfortable and in no distress sitting up in bed. He has some thin bloody drainage from his left thigh wound  Lab Results Lab Results  Component Value Date   WBC 15.6* 03/16/2014   HGB 6.7* 03/16/2014   HCT 20.7* 03/16/2014   MCV 98.1 03/16/2014   PLT 120* 03/16/2014    Lab Results  Component Value Date   CREATININE 5.75* 03/16/2014   BUN 125* 03/16/2014   NA 134* 03/16/2014   K 4.5 03/16/2014   CL 96 03/16/2014   CO2 29 03/16/2014    Lab Results  Component Value Date  ALT 10 03/15/2014   AST 22 03/15/2014   ALKPHOS 28* 03/15/2014   BILITOT 0.7 03/15/2014     Microbiology: Recent Results (from the past 240 hour(s))  Anaerobic culture     Status: None (Preliminary result)   Collection Time: 03/14/14 12:40 PM  Result Value Ref Range Status   Specimen Description WOUND LEFT THIGH  Final   Special Requests NONE  Final   Gram Stain   Final    RARE WBC PRESENT, PREDOMINANTLY PMN NO SQUAMOUS EPITHELIAL CELLS SEEN NO ORGANISMS SEEN Performed at [facility]    Culture   Final    NO ANAEROBES ISOLATED; CULTURE IN PROGRESS FOR 5 DAYS Performed at [facility]    Report Status PENDING  Incomplete  Gram stain     Status: None   Collection Time: 03/14/14 12:40 PM  Result Value Ref Range Status   Specimen Description WOUND LEFT THIGH  Final   Special Requests NONE  Final   Gram Stain   Final    FEW WBC PRESENT, PREDOMINANTLY PMN NO ORGANISMS SEEN Gram Stain Report Called to,Read Back By and Verified With: D.JOHNSON,RN 03/14/14 1320 BY BSLADE    Report Status  03/14/2014 FINAL  Final  Wound culture     Status: None   Collection Time: 03/14/14 12:40 PM  Result Value Ref Range Status   Specimen Description WOUND LEFT THIGH  Final   Special Requests NONE  Final   Gram Stain   Final    FEW WBC PRESENT, PREDOMINANTLY PMN NO SQUAMOUS EPITHELIAL CELLS SEEN NO ORGANISMS SEEN Performed at [facility] Performed at [facility]    Culture   Final    NO GROWTH 2 DAYS Note: Gram Stain Report Called to,Read Back By and Verified With: D Laural Benes RN 03/14/14 1320 BY BSLADE Performed at [facility]    Report Status 03/16/2014 FINAL  Final  Gram stain     Status: None   Collection Time: 03/14/14  1:29 PM  Result Value Ref Range Status   Specimen Description TISSUE LEG LEFT  Final   Special Requests SPEC (C) PT ON VANCOMYCIN  Final   Gram Stain NO WBC SEEN NO ORGANISMS SEEN   Final   Report Status 03/14/2014 FINAL  Final  Anaerobic culture     Status: None (Preliminary result)   Collection Time: 03/14/14  1:29 PM  Result Value Ref Range Status   Specimen Description TISSUE LEG LEFT  Final   Special Requests NON UNION LEFT FEMUR PT ON VANCOMYCIN  Final   Gram Stain   Final    NO WBC SEEN NO ORGANISMS SEEN Performed at [facility] Performed at [facility]    Culture   Final    NO ANAEROBES ISOLATED; CULTURE IN PROGRESS FOR 5 DAYS Performed at [facility]    Report Status PENDING  Incomplete  Tissue culture     Status: None (Preliminary result)   Collection Time: 03/14/14  1:29 PM  Result Value Ref Range Status   Specimen Description TISSUE LEG LEFT  Final   Special Requests NON UNION LEFT FEMUR PT ON VANCOMYCIN  Final   Gram Stain   Final    NO WBC SEEN NO ORGANISMS SEEN Performed at [facility] Performed at [facility]    Culture   Final    NO GROWTH 1 DAY Performed at [facility]    Report Status PENDING  Incomplete  Anaerobic culture  Status: None  (Preliminary result)   Collection Time: 03/14/14  1:37 PM  Result Value Ref Range Status   Specimen Description TISSUE LEG LEFT  Final   Special Requests NON UNION LEFT FEMUR SPEC (B) PT ON VANCOMYCIN  Final   Gram Stain   Final    NO WBC SEEN NO ORGANISMS SEEN Performed at [facility] Performed at [facility]    Culture   Final    NO ANAEROBES ISOLATED; CULTURE IN PROGRESS FOR 5 DAYS Performed at [facility]    Report Status PENDING  Incomplete  Anaerobic culture     Status: None (Preliminary result)   Collection Time: 03/14/14  1:37 PM  Result Value Ref Range Status   Specimen Description TISSUE LEG LEFT  Final   Special Requests NON UNION LEFT FEMUR SPEC (D) PT ON VANCOMYCINA  Final   Gram Stain   Final    FEW WBC PRESENT, PREDOMINANTLY MONONUCLEAR NO ORGANISMS SEEN Performed at [facility] Performed at [facility]    Culture   Final    NO ANAEROBES ISOLATED; CULTURE IN PROGRESS FOR 5 DAYS Performed at [facility]    Report Status PENDING  Incomplete  Gram stain     Status: None   Collection Time: 03/14/14  1:37 PM  Result Value Ref Range Status   Specimen Description TISSUE LEG LEFT  Final   Special Requests NON UNION LEFT FEMUR SPEC (B) PT ON VANCOMYCIN  Final   Gram Stain NO WBC SEEN NO ORGANISMS SEEN   Final   Report Status 03/14/2014 FINAL  Final  Gram stain     Status: None   Collection Time: 03/14/14  1:37 PM  Result Value Ref Range Status   Specimen Description TISSUE LEG LEFT  Final   Special Requests NON UNION LEFT FEMUR SPEC (D) PT ON VANCOMYCIN  Final   Gram Stain   Final    FEW WBC PRESENT, PREDOMINANTLY MONONUCLEAR NO ORGANISMS SEEN    Report Status 03/15/2014 FINAL  Final  Tissue culture     Status: None (Preliminary result)   Collection Time: 03/14/14  1:37 PM  Result Value Ref Range Status   Specimen Description TISSUE LEG LEFT  Final   Special Requests NON UNION LEFT FEMUR SPEC (B) PT ON  VANCOMYCIN  Final   Gram Stain   Final    NO WBC SEEN NO ORGANISMS SEEN Performed at [facility] Performed at [facility]    Culture   Final    NO GROWTH 1 DAY Performed at [facility]    Report Status PENDING  Incomplete  Tissue culture     Status: None (Preliminary result)   Collection Time: 03/14/14  1:37 PM  Result Value Ref Range Status   Specimen Description TISSUE LEG LEFT  Final   Special Requests NON UNION LEFT FEMUR SPEC (D) PT ON VANCOMYCIN  Final   Gram Stain   Final    FEW WBC PRESENT, PREDOMINANTLY MONONUCLEAR NO ORGANISMS SEEN Performed at [facility] Performed at [facility]    Culture   Final    NO GROWTH 1 DAY Performed at [facility]    Report Status PENDING  Incomplete    Assessment: Operative Gram stain did not reveal any organism and cultures are negative at 48 hours. Even though he was on chronic amoxicillin clavulanate prior to his surgery, if he has chronic osteomyelitis due to multidrug-resistant organism such  as MRSA or pseudomonas they should grow from operative cultures. If cultures remain negative to consider converting back to oral amoxicillin clavulanate upon discharge.  Plan: 1. Continue piperacillin tazobactam pending final culture results  Cliffton Asters, MD [facility] for Infectious Disease [facility] [phone] pager   [phone] cell 03/16/2014, 4:19 PM

## 2014-03-16 NOTE — Clinical Social Work Psychosocial (Signed)
Clinical Social Work Department BRIEF PSYCHOSOCIAL ASSESSMENT 03/16/2014  Patient:  Chad [name], Chad [name]     Account Number:  [id number]     Admit date:  03/14/2014  Clinical Social Worker:  Read Drivers  Date/Time:  03/16/2014 03:12 PM  Referred by:  Physician  Date Referred:  03/16/2014 Referred for  SNF Placement   Other Referral:   Interview type:  Patient Other interview type:   none    PSYCHOSOCIAL DATA Living Status:  FAMILY Admitted from facility:   Level of care:   Primary support name:  Feliz Beam Primary support relationship to patient:  CHILD, ADULT Degree of support available:   strong    CURRENT CONCERNS Current Concerns  Post-Acute Placement   Other Concerns:   none    SOCIAL WORK ASSESSMENT / PLAN CSW assessed patient at bedside.  PT is recommeding SNF at time of dc.  Patient does not wish to receive STR at SNF instead he wishes to return home with home health services set up.  Patient reports to CSW that he has assistance from a family friend, a family member and possibly his son. Patient is on home oxygen and active with [facility].  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  Patient is passionate about helping others obtain fulfillment and sense of purpose.  CSW consulted with RNCM regarding disposition.  Patient is agreeable to SNF search of [facility].  CSW will continue to follow for possible disposition of SNF at time of discharge.   Assessment/plan status:  Psychosocial Support/Ongoing Assessment of Needs Other assessment/ plan:   FL2  PASARR   Information/referral to community resources:   SNF/STR  RNCM- [facility] (active)    PATIENT'S/FAMILY'S RESPONSE TO PLAN OF CARE: Patient wishes to return home once medically stable. Patient is agreeable to SNF search as a back-up option to  home.       Vickii Penna, LCSWA [phone]  Psychiatric & Orthopedics (5N 1-16) Clinical Social Worker

## 2014-03-16 NOTE — Progress Notes (Signed)
Physical Therapy Treatment Patient Details Name: Chad [name] MRN: [id number] DOB: 03-Jun-1955 Today's Date: 03/16/2014    History of Present Illness 59 y/o black male with complex medical hx including Hepatitis C and renal transplant presents for repair of L distal femur nonunion. 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 Pt sustained a ground level fall in 2014 and had numerous procedures on this. Some question as to the presence of infection. Pt remains on abx. Continues to have pain and difficulty with daily activities. Now s/p partial escision of L distal femur and placement of antibiotic beads; NWB    PT Comments    Due to pt's insurance , he would not likely be approved for CIR per IP Rehab admission coordinator note.  I will therefore  change  PT DC disposition to SNF.  Pt. Will benefit from ongoing PT to maximize his independence in transfers and work toward full compliance with NWB status.    Follow Up Recommendations  SNF     Equipment Recommendations  None recommended by PT    Recommendations for Other Services OT consult     Precautions / Restrictions Precautions Precautions: Fall Restrictions Weight Bearing Restrictions: Yes LLE Weight Bearing: Non weight bearing    Mobility  Bed Mobility Overal bed mobility: Needs Assistance Bed Mobility: Supine to Sit     Supine to sit: Min assist     General bed mobility comments: min assist to move L LE to EOB and to control to floor  Transfers Overall transfer level: Needs assistance Equipment used: Rolling walker (2 wheeled) Transfers: Sit to/from Stand Sit to Stand: Mod assist         General transfer comment: mod assist to power up to stand; emphatic vc's for NWB L  LE on rise to stand.  Once he was upright, he was able to maintain NWB during pivot transfer to recliner.    Ambulation/Gait Ambulation/Gait assistance: Min assist Ambulation Distance (Feet):  (pivoted on right foot from bed to recliner) Assistive  device: Rolling walker (2 wheeled)           Stairs            Wheelchair Mobility    Modified Rankin (Stroke Patients Only)       Balance                                    Cognition Arousal/Alertness: Awake/alert Behavior During Therapy: WFL for tasks assessed/performed Overall Cognitive Status: Within Functional Limits for tasks assessed                      Exercises      General Comments        Pertinent Vitals/Pain Pain Assessment: 0-10 Pain Score: 8  Pain Descriptors / Indicators: Discomfort Pain Intervention(s): Monitored during session;Repositioned    Home Living                      Prior Function            PT Goals (current goals can now be found in the care plan section) Progress towards PT goals: Progressing toward goals    Frequency  Min 5X/week    PT Plan Current plan remains appropriate    Co-evaluation             End of Session Equipment Utilized During Treatment: Gait belt  Activity Tolerance: Patient tolerated treatment well Patient left: in chair;with call bell/phone within reach     Time: 1121-1144 PT Time Calculation (min) (ACUTE ONLY): 23 min  Charges:  $Therapeutic Activity: 23-37 mins                    G Codes:      Ferman Hamming 03/16/2014, 1:34 PM Weldon Picking PT Acute Rehab Services [phone] Beeper [phone]

## 2014-03-16 NOTE — Progress Notes (Signed)
Orthopaedic Trauma Service Progress Note  Subjective  Doing well  Feels good No specific complaints Got up to chair yesterday  Pain tolerable  Tolerating diet  Review of Systems  Constitutional: Negative for fever and chills.  Respiratory: Negative for shortness of breath and wheezing.   Cardiovascular: Negative for chest pain and palpitations.  Gastrointestinal: Negative for nausea, vomiting and abdominal pain.  Neurological: Negative for headaches.     Objective   BP 98/57 mmHg  Pulse 69  Temp(Src) 98.5 F (36.9 C) (Oral)  Resp 18  Ht 6' (1.829 m)  Wt 123.378 kg (272 lb)  BMI 36.88 kg/m2  SpO2 94%  Intake/Output      02/17 0701 - 02/18 0700 02/18 0701 - 02/19 0700   P.O. 1080    I.V. (mL/kg) 763.3 (6.2)    Total Intake(mL/kg) 1843.3 (14.9)    Urine (mL/kg/hr) 500 (0.2)    Blood     Total Output 500     Net +1343.3            Labs  Results for Chad, [name] (MRN [id number]) as of 03/16/2014 09:08  Ref. Range 03/16/2014 07:23  Sodium Latest Range: 135-145 mmol/L 134 (L)  Potassium Latest Range: 3.5-5.1 mmol/L 4.5  Chloride Latest Range: 96-112 mmol/L 96  CO2 Latest Range: 19-32 mmol/L 29  BUN Latest Range: 6-23 mg/dL 409 (H)  Creatinine Latest Range: 0.50-1.35 mg/dL 8.11 (H)  Calcium Latest Range: 8.4-10.5 mg/dL 7.6 (L)  GFR calc non Af Amer Latest Range: >90 mL/min 10 (L)  GFR calc Af Amer Latest Range: >90 mL/min 11 (L)  Glucose Latest Range: 70-99 mg/dL 914 (H)  Anion gap Latest Range: 5-15  9  WBC Latest Range: 4.0-10.5 K/uL 15.6 (H)  RBC Latest Range: 4.22-5.81 MIL/uL 2.11 (L)  Hemoglobin Latest Range: 13.0-17.0 g/dL 6.7 (LL)  HCT Latest Range: 39.0-52.0 % 20.7 (L)  MCV Latest Range: 78.0-100.0 fL 98.1  MCH Latest Range: 26.0-34.0 pg 31.8  MCHC Latest Range: 30.0-36.0 g/dL 78.2  RDW Latest Range: 11.5-15.5 % 16.7 (H)  Platelets Latest Range: 150-400 K/uL 120 (L)  RBC. Latest Range: 4.22-5.81 MIL/uL 2.11 (L)  Retic Ct Pct Latest Range:  0.4-3.1 % 6.4 (H)  Retic Count, Manual Latest Range: 19.0-186.0 K/uL 135.0  Results for Chad, [name] (MRN [id number]) as of 03/16/2014 09:08  Ref. Range 03/14/2014 09:32  Vit D, 25-Hydroxy Latest Range: 30.0-100.0 ng/mL 46.3   Results for Chad, [name] (MRN [id number]) as of 03/16/2014 09:08  Ref. Range 03/14/2014 09:32  PTH Latest Range: 15-65 pg/mL 120 (H)   Cultures (intra-op)- still no growth to date   Exam  Gen: awake and alert, NAD, resting comfortably in bed Lungs: breathing unlabored, clear anterior fields Cardiac: s1 and s2  Abd: + BS, NTND Ext:       Left Lower Extremity   Dressing changed today   Surgical incisions stable, serosanguinous drainage from distal wound noted, oozing   No signs of infection   No erythema   Ext warm   + DP pulse  DPN, SPN, TN sensation grossly intact   EHL, FHL, AT, PT, peroneals, gastroc motor intact  Wound dorsum of foot stable  Distal edema unchanged   ABI's   ARTERIAL  ABI completed: Within normal limits       RIGHT        LEFT       PRESSURE  WAVEFORM    PRESSURE  WAVEFORM   BRACHIAL  119  Tri  BRACHIAL  110  Tri   DP      DP       AT  110  Tri  AT  113  Tri   PT  123  Tri  PT  119  Tri   PER      PER       GREAT TOE    NA  GREAT TOE    NA        RIGHT  LEFT   ABI  1.03  1.0       Assessment and Plan   POD/HD#: 2   59 y/o chronically ill black male with 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 Medical issues- CKD, Hep C, CHF,              Appreciate medicine assistance with management of  chronic medical issues             Home meds  Dc ASA due to ABL anemia and thrombocytopenia     Renal consult due to increasing Creatinine, suspect this is due to his hypovolemia/dec perfusion    Vitamin D levels are good and PTH levels look good for this particular pt   3. ABL anemia  2 units of PRBC's today  F/u cbc in am   4. DVT/PE prophylaxis             Lovenox   5. FEN               Advance diet as tolerated  6. Pain               Continue with current pain regimen   7. Dispo             F/u on cultures               Continue with current management                   Mearl Latin, PA-C [facility] [phone] (O) 03/16/2014 9:00 AM

## 2014-03-16 NOTE — Progress Notes (Signed)
[name] Consult Note   Chad [name] NOI:[id number] DOB: 07/16/1955 DOA: 03/14/2014 PCP: [name], MD  Brief narrative: Chad [name] is a 59 y.o. male with CK D stage V status post renal transplant on immunosuppressive therapy, nonischemic cardiomyopathy with an EF of 30-35%, a flutter, hepatitis C as a result of the renal transplant, hypertension admitted to the orthopedic service and underwent partial excision of the left femur for chronic osteomyelitis, removal of hardware of-left femur and insertion of antibiotic beads. The hospitalist group has been asked to manage medical problems.   Subjective: No overnight events C/o right shoulder "cracking"  Assessment/Plan: Status post hardware removal and antibiotic bead with placement left leg ID recommends to continue Zosyn- noted that vancomycin has also been ordered and I will discontinue this especially as his creatinine has been noted to be getting worse today.  Acute on Chronic kidney disease stage IV with h/o renal transplant -baseline about 4 -Last creatinine checked in August 2015 was 3.95 -appears to be over diuresed- will hold diuretics - he received his dose of lasix for this morning already - drop in Hb and hypotension may be contributing to worsening renal function - will start slow IVF at 50 cc/hr -Vancomycin on hold -nephrology consult  Anemia- etiology uncertain but may be related to acute blood loss -transfuse  Hx of renal transplant continue rapamune and prednisone  Nonischemic cardiomyopathy hold Lasix and Zaroxolyn. Continue aspirin and statin. Monitor on telemetry.  Hyperlipidemia Continue statin  A flutter Rate control. Not on anticoagulation. Continue amiodarone.  Hypertension Continue amlodipine and Coreg  Gout Continue allopurinol  GERD Continue PPI    Code Status: *Full code Family Communication:  Disposition Plan: To be determined-based on ortho suggestions DVT  prophylaxis: Per orthopedics  Antibiotics: Anti-infectives    Start     Dose/Rate Route Frequency Ordered Stop   03/14/14 2200  vancomycin (VANCOCIN) 1,500 mg in sodium chloride 0.9 % 500 mL IVPB     1,500 mg 250 mL/hr over 120 Minutes Intravenous  Once 03/14/14 2021 03/15/14 0110   03/14/14 2100  piperacillin-tazobactam (ZOSYN) IVPB 2.25 g     2.25 g 100 mL/hr over 30 Minutes Intravenous 4 times per day 03/14/14 2021     03/14/14 1421  vancomycin (VANCOCIN) powder  Status:  Discontinued       As needed 03/14/14 1421 03/14/14 1521   03/14/14 1414  tobramycin (NEBCIN) powder  Status:  Discontinued       As needed 03/14/14 1415 03/14/14 1521   03/14/14 1330  vancomycin (VANCOCIN) IVPB 1000 mg/200 mL premix     1,000 mg 200 mL/hr over 60 Minutes Intravenous To Surgery 03/14/14 1323 03/14/14 1328         Objective: Filed Weights   03/14/14 0931  Weight: 123.378 kg (272 lb)    Intake/Output Summary (Last 24 hours) at 03/16/14 1228 Last data filed at 03/16/14 0830  Gross per 24 hour  Intake 1843.33 ml  Output    300 ml  Net 1543.33 ml     Vitals Filed Vitals:   03/15/14 1630 03/15/14 2006 03/16/14 0616 03/16/14 0644  BP: 97/51 85/40 98/57    Pulse: 71 74  69  Temp:  98.3 F (36.8 C)  98.5 F (36.9 C)  TempSrc:  Oral  Oral  Resp: 18     Height:      Weight:      SpO2: 96% 94%  94%    Exam: General: Awake alert oriented 3,  No acute respiratory distress Lungs: Clear to auscultation bilaterally without wheezes or crackles Cardiovascular: Regular rate and rhythm without murmur gallop or rub normal S1 and S2 Abdomen: Nontender, nondistended, soft, bowel sounds positive, no rebound, no ascites, no appreciable mass Extremities: No significant cyanosis, clubbing, or edema bilateral lower extremities- left leg and foot wrapped in Ace wrap  Data Reviewed: Basic Metabolic Panel:  Recent Labs Lab 03/14/14 0932 03/14/14 2119 03/15/14 0544 03/16/14 0723  NA  --   --   136 134*  K  --   --  4.1 4.5  CL  --   --  96 96  CO2  --   --  32 29  GLUCOSE  --   --  158* 122*  BUN  --   --  115* 125*  CREATININE  --  4.94* 5.04* 5.75*  CALCIUM 9.4  --  8.2* 7.6*   Liver Function Tests:  Recent Labs Lab 03/15/14 0544  AST 22  ALT 10  ALKPHOS 28*  BILITOT 0.7  PROT 5.6*  ALBUMIN 2.2*   No results for input(s): LIPASE, AMYLASE in the last 168 hours. No results for input(s): AMMONIA in the last 168 hours. CBC:  Recent Labs Lab 03/14/14 2119 03/15/14 0544 03/16/14 0723  WBC 18.3* 14.6* 15.6*  HGB 10.4* 8.2* 6.7*  HCT 32.5* 25.5* 20.7*  MCV 98.5 97.7 98.1  PLT 182 147* 120*   Cardiac Enzymes: No results for input(s): CKTOTAL, CKMB, CKMBINDEX, TROPONINI in the last 168 hours. BNP (last 3 results) No results for input(s): BNP in the last 8760 hours.  ProBNP (last 3 results) No results for input(s): PROBNP in the last 8760 hours.  CBG: No results for input(s): GLUCAP in the last 168 hours.  Recent Results (from the past 240 hour(s))  Anaerobic culture     Status: None (Preliminary result)   Collection Time: 03/14/14 12:40 PM  Result Value Ref Range Status   Specimen Description WOUND LEFT THIGH  Final   Special Requests NONE  Final   Gram Stain   Final    RARE WBC PRESENT, PREDOMINANTLY PMN NO SQUAMOUS EPITHELIAL CELLS SEEN NO ORGANISMS SEEN Performed at [facility]    Culture   Final    NO ANAEROBES ISOLATED; CULTURE IN PROGRESS FOR 5 DAYS Performed at [facility]    Report Status PENDING  Incomplete  Gram stain     Status: None   Collection Time: 03/14/14 12:40 PM  Result Value Ref Range Status   Specimen Description WOUND LEFT THIGH  Final   Special Requests NONE  Final   Gram Stain   Final    FEW WBC PRESENT, PREDOMINANTLY PMN NO ORGANISMS SEEN Gram Stain Report Called to,Read Back By and Verified With: D.JOHNSON,RN 03/14/14 1320 BY BSLADE    Report Status 03/14/2014 FINAL  Final  Wound culture      Status: None   Collection Time: 03/14/14 12:40 PM  Result Value Ref Range Status   Specimen Description WOUND LEFT THIGH  Final   Special Requests NONE  Final   Gram Stain   Final    FEW WBC PRESENT, PREDOMINANTLY PMN NO SQUAMOUS EPITHELIAL CELLS SEEN NO ORGANISMS SEEN Performed at [name] Performed at [facility]    Culture   Final    NO GROWTH 2 DAYS Note: Gram Stain Report Called to,Read Back By and Verified With: Timoteo Expose RN 03/14/14 1320 BY BSLADE Performed at [facility]  Report Status 03/16/2014 FINAL  Final  Gram stain     Status: None   Collection Time: 03/14/14  1:29 PM  Result Value Ref Range Status   Specimen Description TISSUE LEG LEFT  Final   Special Requests SPEC (C) PT ON VANCOMYCIN  Final   Gram Stain NO WBC SEEN NO ORGANISMS SEEN   Final   Report Status 03/14/2014 FINAL  Final  Anaerobic culture     Status: None (Preliminary result)   Collection Time: 03/14/14  1:29 PM  Result Value Ref Range Status   Specimen Description TISSUE LEG LEFT  Final   Special Requests NON UNION LEFT FEMUR PT ON VANCOMYCIN  Final   Gram Stain   Final    NO WBC SEEN NO ORGANISMS SEEN Performed at [facility] Performed at [facility]    Culture   Final    NO ANAEROBES ISOLATED; CULTURE IN PROGRESS FOR 5 DAYS Performed at [facility]    Report Status PENDING  Incomplete  Tissue culture     Status: None (Preliminary result)   Collection Time: 03/14/14  1:29 PM  Result Value Ref Range Status   Specimen Description TISSUE LEG LEFT  Final   Special Requests NON UNION LEFT FEMUR PT ON VANCOMYCIN  Final   Gram Stain   Final    NO WBC SEEN NO ORGANISMS SEEN Performed at [facility] Performed at [facility]    Culture   Final    NO GROWTH 1 DAY Performed at [facility]    Report Status PENDING  Incomplete  Anaerobic culture     Status: None (Preliminary result)   Collection Time: 03/14/14   1:37 PM  Result Value Ref Range Status   Specimen Description TISSUE LEG LEFT  Final   Special Requests NON UNION LEFT FEMUR SPEC (B) PT ON VANCOMYCIN  Final   Gram Stain   Final    NO WBC SEEN NO ORGANISMS SEEN Performed at [facility] Performed at [facility]    Culture   Final    NO ANAEROBES ISOLATED; CULTURE IN PROGRESS FOR 5 DAYS Performed at [facility]    Report Status PENDING  Incomplete  Anaerobic culture     Status: None (Preliminary result)   Collection Time: 03/14/14  1:37 PM  Result Value Ref Range Status   Specimen Description TISSUE LEG LEFT  Final   Special Requests NON UNION LEFT FEMUR SPEC (D) PT ON VANCOMYCINA  Final   Gram Stain   Final    FEW WBC PRESENT, PREDOMINANTLY MONONUCLEAR NO ORGANISMS SEEN Performed at [facility] Performed at [facility]    Culture   Final    NO ANAEROBES ISOLATED; CULTURE IN PROGRESS FOR 5 DAYS Performed at [facility]    Report Status PENDING  Incomplete  Gram stain     Status: None   Collection Time: 03/14/14  1:37 PM  Result Value Ref Range Status   Specimen Description TISSUE LEG LEFT  Final   Special Requests NON UNION LEFT FEMUR SPEC (B) PT ON VANCOMYCIN  Final   Gram Stain NO WBC SEEN NO ORGANISMS SEEN   Final   Report Status 03/14/2014 FINAL  Final  Gram stain     Status: None   Collection Time: 03/14/14  1:37 PM  Result Value Ref Range Status   Specimen Description TISSUE LEG LEFT  Final   Special Requests NON UNION LEFT FEMUR  SPEC (D) PT ON VANCOMYCIN  Final   Gram Stain   Final    FEW WBC PRESENT, PREDOMINANTLY MONONUCLEAR NO ORGANISMS SEEN    Report Status 03/15/2014 FINAL  Final  Tissue culture     Status: None (Preliminary result)   Collection Time: 03/14/14  1:37 PM  Result Value Ref Range Status   Specimen Description TISSUE LEG LEFT  Final   Special Requests NON UNION LEFT FEMUR SPEC (B) PT ON VANCOMYCIN  Final   Gram Stain   Final    NO WBC  SEEN NO ORGANISMS SEEN Performed at [facility] Performed at [facility]    Culture   Final    NO GROWTH 1 DAY Performed at [facility]    Report Status PENDING  Incomplete  Tissue culture     Status: None (Preliminary result)   Collection Time: 03/14/14  1:37 PM  Result Value Ref Range Status   Specimen Description TISSUE LEG LEFT  Final   Special Requests NON UNION LEFT FEMUR SPEC (D) PT ON VANCOMYCIN  Final   Gram Stain   Final    FEW WBC PRESENT, PREDOMINANTLY MONONUCLEAR NO ORGANISMS SEEN Performed at [facility] Performed at [facility]    Culture   Final    NO GROWTH 1 DAY Performed at [facility]    Report Status PENDING  Incomplete       Scheduled Meds:  Scheduled Meds: . sodium chloride   Intravenous Once  . acetaminophen  650 mg Oral Once  . allopurinol  100 mg Oral BID  . amiodarone  100 mg Oral Daily  . amLODipine  2.5 mg Oral Daily  . calcitRIOL  0.5 mcg Oral Daily  . carvedilol  25 mg Oral Daily  . Cholecalciferol  5,000 Units Oral Daily  . diphenhydrAMINE  25 mg Oral Once  . docusate sodium  100 mg Oral BID  . enoxaparin (LOVENOX) injection  30 mg Subcutaneous Q24H  . furosemide  20 mg Intravenous Once  . hydrALAZINE  100 mg Oral 3 times per day  . isosorbide mononitrate  60 mg Oral Daily  . piperacillin-tazobactam (ZOSYN)  IV  2.25 g Intravenous 4 times per day  . potassium chloride SA  20 mEq Oral BID  . pravastatin  40 mg Oral QHS  . predniSONE  10 mg Oral Daily  . sirolimus  1.2 mg Oral q morning - 10a   Continuous Infusions: . sodium chloride 50 mL/hr at 03/16/14 0606    Time spent on care of this patient: 45 min   Cristela Stalder, DO  03/16/2014, 12:28 PM  LOS: 2 days   [name] Office  [phone] Pager - Text Page per [email or web address]  If 7PM-7AM, please contact night-coverage [email or web address]

## 2014-03-16 NOTE — Consult Note (Signed)
[facility] Renal Consultation Note  Requesting MD: Rizwan Indication for Consultation:  Renal transplant with advanced CKD  HPI:  Chad [name] is a 59 y.o. male well-known to [place]. He has a past medical history of hypertension, gout, hepatitis C and hyperlipidemia. He also has a history of ESRD previously on peritoneal dialysis before undergoing a renal transplant in 1996. He is followed by Dr. Graylon Gunning at [facility]. He's had advanced chronic kidney disease that has shown signs of progression. He was last seen in the office last month by Dr. Posey Pronto at which time his BUN and creatinine were 83 and 4.46 with really other normal electrolytes and he was feeling well. 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 Postop he has had worsening of his renal function. Creatinine today up to 5.7 and BUN up to 125 but with a good potassium and bicarbonate. Hemoglobin is down 6.7 and he tells me that he is going to get blood today. Despite his terrible looking numbers he states he feels well. He is sitting up in a bedside chair eating raisins. He does seem to be making urine although not a robust amount. He actually says he feels better than he has in quite some time. Postoperative blood pressures have been quite low that may be part of the issue.  CREATININE, SER  Date/Time Value Ref Range Status  03/16/2014 07:23 AM 5.75* 0.50 - 1.35 mg/dL Final  03/15/2014 05:44 AM 5.04* 0.50 - 1.35 mg/dL Final  03/14/2014 09:19 PM 4.94* 0.50 - 1.35 mg/dL Final  03/06/2014 03:19 PM 4.71* 0.50 - 1.35 mg/dL Final  09/19/2013 10:57 AM 3.95* 0.50 - 1.35 mg/dL Final  02/16/2013 02:19 PM 3.57* 0.50 - 1.35 mg/dL Final  02/07/2013 12:09 PM 3.3* 0.4 - 1.5 mg/dL Final  01/17/2013 12:05 PM 4.19* 0.50 - 1.35  mg/dL Final  09/30/2012 07:22 PM 2.82* 0.50 - 1.35 mg/dL Final  08/30/2012 05:00 AM 3.44* 0.50 - 1.35 mg/dL Final  08/29/2012 05:10 AM 3.54* 0.50 - 1.35 mg/dL Final  08/28/2012 06:07 AM 3.65* 0.50 - 1.35 mg/dL Final  08/27/2012 05:30 AM 3.80* 0.50 - 1.35 mg/dL Final  08/26/2012 05:05 AM 3.68* 0.50 - 1.35 mg/dL Final  08/25/2012 05:05 AM 3.58* 0.50 - 1.35 mg/dL Final  08/24/2012 04:50 AM 3.51* 0.50 - 1.35 mg/dL Final  08/23/2012 05:40 AM 3.63* 0.50 - 1.35 mg/dL Final  08/22/2012 08:52 AM 3.59* 0.50 - 1.35 mg/dL Final  08/21/2012 05:10 AM 3.40* 0.50 - 1.35 mg/dL Final  08/20/2012 04:40 AM 3.18* 0.50 - 1.35 mg/dL Final  08/19/2012 07:34 PM 3.17* 0.50 - 1.35 mg/dL Final  08/19/2012 04:46 PM 3.25* 0.50 - 1.35 mg/dL Final    Comment:    HEMOLYZED SPECIMEN, RESULTS MAY BE AFFECTED  08/05/2012 05:50 AM 3.37* 0.50 - 1.35 mg/dL Final  07/29/2012 05:33 AM 3.70* 0.50 - 1.35 mg/dL Final  07/24/2012 12:05 PM 3.65* 0.50 - 1.35 mg/dL Final  07/22/2012 05:45 AM 3.26* 0.50 - 1.35 mg/dL Final  07/18/2012 05:55 AM 3.31* 0.50 - 1.35 mg/dL Final  07/17/2012 07:20 AM 3.29* 0.50 - 1.35 mg/dL Final  07/16/2012 04:55 AM 3.39* 0.50 - 1.35 mg/dL Final  07/15/2012 05:03 PM 3.54* 0.50 - 1.35 mg/dL Final  07/15/2012 05:40 AM 3.60* 0.50 - 1.35 mg/dL Final  07/14/2012 03:50 AM 3.84*  0.50 - 1.35 mg/dL Final  07/13/2012 04:50 AM 4.06* 0.50 - 1.35 mg/dL Final  07/12/2012 04:40 AM 4.26* 0.50 - 1.35 mg/dL Final  07/11/2012 05:25 AM 3.83* 0.50 - 1.35 mg/dL Final  07/10/2012 03:50 PM 4.15* 0.50 - 1.35 mg/dL Final  07/10/2012 05:40 AM 4.04* 0.50 - 1.35 mg/dL Final  07/09/2012 11:10 PM 4.17* 0.50 - 1.35 mg/dL Final  03/16/2012 06:07 AM 3.88* 0.50 - 1.35 mg/dL Final  03/15/2012 04:40 AM 3.77* 0.50 - 1.35 mg/dL Final  03/14/2012 06:10 AM 3.77* 0.50 - 1.35 mg/dL Final  03/13/2012 05:30 AM 3.54* 0.50 - 1.35 mg/dL Final  03/12/2012 12:22 PM 3.54* 0.50 - 1.35 mg/dL Final  03/12/2012 04:48 AM 3.51* 0.50 - 1.35 mg/dL Final   12/30/2011 10:57 AM 3.2* 0.4 - 1.5 mg/dL Final  12/11/2011 04:50 AM 3.10* 0.50 - 1.35 mg/dL Final  12/10/2011 04:44 PM 3.04* 0.50 - 1.35 mg/dL Final  12/10/2011 05:15 AM 3.11* 0.50 - 1.35 mg/dL Final  12/09/2011 05:00 AM 2.77* 0.50 - 1.35 mg/dL Final  12/08/2011 01:12 PM 2.79* 0.50 - 1.35 mg/dL Final  12/03/2011 08:35 AM 3.0* 0.4 - 1.5 mg/dL Final  11/18/2011 10:33 AM 3.1* 0.4 - 1.5 mg/dL Final     PMHx:   Past Medical History  Diagnosis Date  . CKD (chronic kidney disease), stage IV     a. s/p renal transplant 1996.  Marland Kitchen HLD (hyperlipidemia)     takes Pravastatin daily  . Nonischemic cardiomyopathy     a. unknown etiology, EF 30-35% by echo 09/2011;  b. 09/2011 Lexi MV EF 42%, no ischemia/infarct.  . Systolic CHF     takes Lasix daily  . Atrial flutter     takes Amiodarone daily  . Gout     takes Allopurinol daily  . Hypertension     takes Amlodipine,Hydralazine,Imdur,and Coreg daily  . GERD (gastroesophageal reflux disease)     takes Protonix daily  . Shortness of breath dyspnea     exertion  . Pneumonia 2014  . History of bronchitis     several yrs ago  . Joint pain     right shoulder and left knee  . Sciatica   . Hepatitis C 1996    Hep C  . History of colon polyps   . History of kidney stones   . Urinary frequency   . History of shingles     Past Surgical History  Procedure Laterality Date  . Nephrectomy transplanted organ Right 64yr ago  . Insertion of dialysis catheter  06/20/2011    Procedure: INSERTION OF DIALYSIS CATHETER;  Surgeon: VSerafina Mitchell MD;  [place] [facility]  Service: Vascular;  Laterality: Right;  Ultrasound guided insertion of right internal jugular dialysis catheter  . Multiple extractions with alveoloplasty  06/27/2011    Procedure: MULTIPLE EXTRACION WITH ALVEOLOPLASTY;  Surgeon: RLenn Cal DDS;  [place] [facility]  Service: Oral Surgery;  Laterality: N/A;  Extraction  of tooth # 14 with alveoloplasty  . Hip surgery Left      replacement  . Joint replacement      left hip  . Femur fracture surgery Left 07/09/2012    Dr CSherrian Divers . Femur im nail Left 07/10/2012    ORIF L distal femur;  Surgeon: JWylene Simmer MD;  [facility] [facility]  Service: Orthopedics;  Laterality: Left;  . I&d extremity Left 08/24/2012    Procedure: LEFT LEG IRRIGATION AND DEBRIDEMENT WITH POSSIBLE WOUND VAC APPLICATION;  Surgeon: JWylene Simmer MD;  [facility];  Service: Orthopedics;  Laterality: Left;  . Tracheostomy  2014  . Tracheostomy closure  2014    2 wks after it was done  . Colonsocopy    . Non union Left 03/14/2014    LEFT FEMER REPAIR     BY DR HANDY  . Hardware removal Left 03/14/2014    Procedure: HARDWARE REMOVAL LEFT;  Surgeon: Rozanna Box, MD;  Location: [place];  Service: Orthopedics;  Laterality: Left;  . Wound debridement Left 03/14/2014    Procedure: DEBRIDEMENT WOUND;  Surgeon: Rozanna Box, MD;  Location: [place];  Service: Orthopedics;  Laterality: Left;  INSERTION OF  ANTIBIOTIC Beads  (2 strands: 15 beads and 14 beads)    Family Hx:  Family History  Problem Relation Age of Onset  . Heart disease Neg Hx     Social History:  reports that he has never smoked. He has never used smokeless tobacco. He reports that he does not drink alcohol or use illicit drugs.  Allergies: No Known Allergies  Medications: Prior to Admission medications   Medication Sig Start Date End Date Taking? Authorizing Provider  allopurinol (ZYLOPRIM) 100 MG tablet Take 1 tablet (100 mg total) by mouth 2 (two) times daily. 08/05/12  Yes Daniel J Angiulli, PA-C  amiodarone (PACERONE) 100 MG tablet TAKE 1 TABLET BY MOUTH EVERY DAY 09/19/13  Yes Larey Dresser, MD  amLODipine (NORVASC) 2.5 MG tablet Take 1 tablet (2.5 mg total) by mouth daily. 09/19/13  Yes Larey Dresser, MD  aspirin 325 MG tablet Take 325 mg by mouth daily.   Yes Historical Provider, MD  calcitRIOL (ROCALTROL) 0.5 MCG capsule Take 0.5 mcg by mouth daily. 02/02/14  Yes Historical  Provider, MD  carvedilol (COREG) 25 MG tablet TAKE 1 TABLET TWICE DAILY WITH A MEAL 02/27/14  Yes Jolaine Artist, MD  Cholecalciferol (VITAMIN D-3) 5000 UNITS TABS Take 5,000 Units by mouth daily.    Yes Historical Provider, MD  furosemide (LASIX) 80 MG tablet Take one and one-half pill twice a day Patient taking differently: Take 40-80 mg by mouth 2 (two) times daily. Take 79m in the morning and 4104mat lunch 09/19/13  Yes DaLarey DresserMD  hydrALAZINE (APRESOLINE) 100 MG tablet TAKE 1 TABLET (100 MG TOTAL) BY MOUTH 3 (THREE) TIMES DAILY. 02/14/14  Yes Amy D Clegg, NP  isosorbide mononitrate (IMDUR) 60 MG 24 hr tablet TAKE 1.5 TABLETS BY MOUTH EVERY DAY 03/02/14  Yes DaShaune Pascalensimhon, MD  metolazone (ZAROXOLYN) 2.5 MG tablet TAKE 1 TABLET BY MOUTH 30 MINUTES BEFORE YOUR MORNING LASIX ON TUES, THURS, AND SAT MORNINGS 03/02/14  Yes DaLarey DresserMD  pantoprazole (PROTONIX) 20 MG tablet Take 20 mg by mouth daily as needed for heartburn.    Yes Historical Provider, MD  potassium chloride SA (KLOR-CON M20) 20 MEQ tablet TAKE 2 TABLET twice daily 02/28/13  Yes DaLarey DresserMD  pravastatin (PRAVACHOL) 40 MG tablet TAKE 1 TABLET (40 MG TOTAL) BY MOUTH DAILY.   Yes DaLarey DresserMD  predniSONE (DELTASONE) 10 MG tablet Take 1 tablet (10 mg total) by mouth daily. 08/05/12  Yes Daniel J Angiulli, PA-C  Probiotic Product (PROBIOTIC DAILY PO) Take 1 capsule by mouth daily.    Yes Historical Provider, MD  sirolimus (RAPAMUNE) 1 MG/ML solution Take 1.2 mg by mouth every morning.    Yes Historical Provider, MD  traMADol (ULTRAM) 50 MG tablet Take 50 mg by mouth every 12 (twelve)  hours as needed (pain). As needed for pain   Yes Historical Provider, MD  amiodarone (PACERONE) 200 MG tablet Take 0.5 tablets (100 mg total) by mouth daily. Patient not taking: Reported on 03/02/2014 01/23/14   Larey Dresser, MD  amiodarone (PACERONE) 200 MG tablet TAKE 1 TABLET BY MOUTH EVERY DAY Patient not taking: Reported on  03/02/2014 02/03/14   Larey Dresser, MD  camphor-menthol [facility]) lotion Apply 1 application topically as needed for itching.     Historical Provider, MD  hydrALAZINE (APRESOLINE) 100 MG tablet TAKE 1 TABLET (100 MG TOTAL) BY MOUTH 3 (THREE) TIMES DAILY. Patient not taking: Reported on 03/02/2014 02/27/14   Amy D Clegg, NP  hydrALAZINE (APRESOLINE) 100 MG tablet TAKE 1 TABLET (100 MG TOTAL) BY MOUTH 3 (THREE) TIMES DAILY. Patient not taking: Reported on 03/02/2014 03/01/14   Jolaine Artist, MD  methocarbamol (ROBAXIN) 500 MG tablet Take 500 mg by mouth daily.  08/05/12   Lavon Paganini Angiulli, PA-C    I have reviewed the patient's current medications.  Labs:  Results for orders placed or performed during the hospital encounter of 03/14/14 (from the past 48 hour(s))  Anaerobic culture     Status: None (Preliminary result)   Collection Time: 03/14/14 12:40 PM  Result Value Ref Range   Specimen Description WOUND LEFT THIGH    Special Requests NONE    Gram Stain      RARE WBC PRESENT, PREDOMINANTLY PMN NO SQUAMOUS EPITHELIAL CELLS SEEN NO ORGANISMS SEEN Performed at [place]; CULTURE IN PROGRESS FOR 5 DAYS Performed at [facility]    Report Status PENDING   Gram stain     Status: None   Collection Time: 03/14/14 12:40 PM  Result Value Ref Range   Specimen Description WOUND LEFT THIGH    Special Requests NONE    Gram Stain      FEW WBC PRESENT, PREDOMINANTLY PMN NO ORGANISMS SEEN Gram Stain Report Called to,Read Back By and Verified With: D.JOHNSON,RN 03/14/14 1320 BY BSLADE    Report Status 03/14/2014 FINAL   Wound culture     Status: None   Collection Time: 03/14/14 12:40 PM  Result Value Ref Range   Specimen Description WOUND LEFT THIGH    Special Requests NONE    Gram Stain      FEW WBC PRESENT, PREDOMINANTLY PMN NO SQUAMOUS EPITHELIAL CELLS SEEN NO ORGANISMS SEEN Performed at [facility] Performed at [facility]    Culture      NO GROWTH 2 DAYS Note: Gram Stain Report Called to,Read Back By and Verified With: D Wynetta Emery RN 03/14/14 1320 BY BSLADE Performed at [facility]    Report Status 03/16/2014 FINAL   Gram stain     Status: None   Collection Time: 03/14/14  1:29 PM  Result Value Ref Range   Specimen Description TISSUE LEG LEFT    Special Requests SPEC (C) PT ON VANCOMYCIN    Gram Stain NO WBC SEEN NO ORGANISMS SEEN     Report Status 03/14/2014 FINAL   Anaerobic culture     Status: None (Preliminary result)   Collection Time: 03/14/14  1:29 PM  Result Value Ref Range   Specimen Description TISSUE LEG LEFT    Special Requests NON UNION LEFT FEMUR PT ON VANCOMYCIN    Gram Stain      NO WBC SEEN NO ORGANISMS SEEN Performed  at [facility] Performed at [place]; CULTURE IN PROGRESS FOR 5 DAYS Performed at [facility]    Report Status PENDING   Tissue culture     Status: None (Preliminary result)   Collection Time: 03/14/14  1:29 PM  Result Value Ref Range   Specimen Description TISSUE LEG LEFT    Special Requests NON UNION LEFT FEMUR PT ON VANCOMYCIN    Gram Stain      NO WBC SEEN NO ORGANISMS SEEN Performed at [facility] Performed at [facility]    Culture      NO GROWTH 1 DAY Performed at [facility]    Report Status PENDING   Anaerobic culture     Status: None (Preliminary result)   Collection Time: 03/14/14  1:37 PM  Result Value Ref Range   Specimen Description TISSUE LEG LEFT    Special Requests NON UNION LEFT FEMUR SPEC (B) PT ON VANCOMYCIN    Gram Stain      NO WBC SEEN NO ORGANISMS SEEN Performed at [facility] Performed at [place]; CULTURE IN PROGRESS FOR 5 DAYS Performed at [facility]    Report Status PENDING   Anaerobic culture     Status: None (Preliminary result)    Collection Time: 03/14/14  1:37 PM  Result Value Ref Range   Specimen Description TISSUE LEG LEFT    Special Requests NON UNION LEFT FEMUR SPEC (D) PT ON VANCOMYCINA    Gram Stain      FEW WBC PRESENT, PREDOMINANTLY MONONUCLEAR NO ORGANISMS SEEN Performed at [facility] Performed at [place]; CULTURE IN PROGRESS FOR 5 DAYS Performed at [facility]    Report Status PENDING   Gram stain     Status: None   Collection Time: 03/14/14  1:37 PM  Result Value Ref Range   Specimen Description TISSUE LEG LEFT    Special Requests NON UNION LEFT FEMUR SPEC (B) PT ON VANCOMYCIN    Gram Stain NO WBC SEEN NO ORGANISMS SEEN     Report Status 03/14/2014 FINAL   Gram stain     Status: None   Collection Time: 03/14/14  1:37 PM  Result Value Ref Range   Specimen Description TISSUE LEG LEFT    Special Requests NON UNION LEFT FEMUR SPEC (D) PT ON VANCOMYCIN    Gram Stain      FEW WBC PRESENT, PREDOMINANTLY MONONUCLEAR NO ORGANISMS SEEN    Report Status 03/15/2014 FINAL   Tissue culture     Status: None (Preliminary result)   Collection Time: 03/14/14  1:37 PM  Result Value Ref Range   Specimen Description TISSUE LEG LEFT    Special Requests NON UNION LEFT FEMUR SPEC (B) PT ON VANCOMYCIN    Gram Stain      NO WBC SEEN NO ORGANISMS SEEN Performed at [facility] Performed at [place] 1 DAY Performed at [facility]    Report Status PENDING   Tissue culture     Status: None (Preliminary result)   Collection Time: 03/14/14  1:37 PM  Result Value Ref Range   Specimen Description TISSUE LEG LEFT  Special Requests NON UNION LEFT FEMUR SPEC (D) PT ON VANCOMYCIN    Gram Stain      FEW WBC PRESENT, PREDOMINANTLY MONONUCLEAR NO ORGANISMS SEEN Performed at [facility] Performed at [facility]    Culture      NO GROWTH 1 DAY Performed at [facility]    Report Status PENDING   CBC     Status: Abnormal   Collection Time: 03/14/14  9:19 PM  Result Value Ref Range   WBC 18.3 (H) 4.0 - 10.5 K/uL   RBC 3.30 (L) 4.22 - 5.81 MIL/uL   Hemoglobin 10.4 (L) 13.0 - 17.0 g/dL   HCT 32.5 (L) 39.0 - 52.0 %   MCV 98.5 78.0 - 100.0 fL   MCH 31.5 26.0 - 34.0 pg   MCHC 32.0 30.0 - 36.0 g/dL   RDW 16.5 (H) 11.5 - 15.5 %   Platelets 182 150 - 400 K/uL  Creatinine, serum     Status: Abnormal   Collection Time: 03/14/14  9:19 PM  Result Value Ref Range   Creatinine, Ser 4.94 (H) 0.50 - 1.35 mg/dL   GFR calc non Af Amer 12 (L) >90 mL/min   GFR calc Af Amer 14 (L) >90 mL/min    Comment: (NOTE) The eGFR has been calculated using the CKD EPI equation. This calculation has not been validated in all clinical situations. eGFR's persistently <90 mL/min signify possible Chronic Kidney Disease.   Comprehensive metabolic panel     Status: Abnormal   Collection Time: 03/15/14  5:44 AM  Result Value Ref Range   Sodium 136 135 - 145 mmol/L   Potassium 4.1 3.5 - 5.1 mmol/L   Chloride 96 96 - 112 mmol/L   CO2 32 19 - 32 mmol/L   Glucose, Bld 158 (H) 70 - 99 mg/dL   BUN 115 (H) 6 - 23 mg/dL   Creatinine, Ser 5.04 (H) 0.50 - 1.35 mg/dL   Calcium 8.2 (L) 8.4 - 10.5 mg/dL   Total Protein 5.6 (L) 6.0 - 8.3 g/dL   Albumin 2.2 (L) 3.5 - 5.2 g/dL   AST 22 0 - 37 U/L   ALT 10 0 - 53 U/L   Alkaline Phosphatase 28 (L) 39 - 117 U/L   Total Bilirubin 0.7 0.3 - 1.2 mg/dL   GFR calc non Af Amer 12 (L) >90 mL/min   GFR calc Af Amer 13 (L) >90 mL/min    Comment: (NOTE) The eGFR has been calculated using the CKD EPI equation. This calculation has not been validated in all clinical situations. eGFR's persistently <90 mL/min signify possible Chronic Kidney Disease.    Anion gap 8 5 - 15  CBC     Status: Abnormal   Collection Time: 03/15/14  5:44 AM  Result Value Ref Range   WBC 14.6 (H) 4.0 - 10.5 K/uL   RBC 2.61 (L) 4.22 - 5.81 MIL/uL   Hemoglobin 8.2  (L) 13.0 - 17.0 g/dL    Comment: DELTA CHECK NOTED REPEATED TO VERIFY    HCT 25.5 (L) 39.0 - 52.0 %   MCV 97.7 78.0 - 100.0 fL   MCH 31.0 26.0 - 34.0 pg   MCHC 31.8 30.0 - 36.0 g/dL   RDW 16.7 (H) 11.5 - 15.5 %   Platelets 147 (L) 150 - 400 K/uL  Basic metabolic panel     Status: Abnormal   Collection Time: 03/16/14  7:23 AM  Result Value Ref Range   Sodium 134 (  L) 135 - 145 mmol/L   Potassium 4.5 3.5 - 5.1 mmol/L   Chloride 96 96 - 112 mmol/L   CO2 29 19 - 32 mmol/L   Glucose, Bld 122 (H) 70 - 99 mg/dL   BUN 125 (H) 6 - 23 mg/dL   Creatinine, Ser 5.75 (H) 0.50 - 1.35 mg/dL   Calcium 7.6 (L) 8.4 - 10.5 mg/dL   GFR calc non Af Amer 10 (L) >90 mL/min   GFR calc Af Amer 11 (L) >90 mL/min    Comment: (NOTE) The eGFR has been calculated using the CKD EPI equation. This calculation has not been validated in all clinical situations. eGFR's persistently <90 mL/min signify possible Chronic Kidney Disease.    Anion gap 9 5 - 15  CBC     Status: Abnormal   Collection Time: 03/16/14  7:23 AM  Result Value Ref Range   WBC 15.6 (H) 4.0 - 10.5 K/uL   RBC 2.11 (L) 4.22 - 5.81 MIL/uL   Hemoglobin 6.7 (LL) 13.0 - 17.0 g/dL    Comment: REPEATED TO VERIFY CRITICAL RESULT CALLED TO, READ BACK BY AND VERIFIED WITH: CORUM,C RN @ [phone] 03/16/14 LEONARD,A    HCT 20.7 (L) 39.0 - 52.0 %   MCV 98.1 78.0 - 100.0 fL   MCH 31.8 26.0 - 34.0 pg   MCHC 32.4 30.0 - 36.0 g/dL   RDW 16.7 (H) 11.5 - 15.5 %   Platelets 120 (L) 150 - 400 K/uL  Reticulocytes     Status: Abnormal   Collection Time: 03/16/14  7:23 AM  Result Value Ref Range   Retic Ct Pct 6.4 (H) 0.4 - 3.1 %   RBC. 2.11 (L) 4.22 - 5.81 MIL/uL   Retic Count, Manual 135.0 19.0 - 186.0 K/uL  Type and screen     Status: None (Preliminary result)   Collection Time: 03/16/14 10:10 AM  Result Value Ref Range   ABO/RH(D) PENDING    Antibody Screen NEG    Sample Expiration 03/19/2014   Prepare RBC     Status: None   Collection Time: 03/16/14  10:10 AM  Result Value Ref Range   Order Confirmation ORDER PROCESSED BY BLOOD BANK      ROS:  A comprehensive review of systems was negative.  Physical Exam: Filed Vitals:   03/16/14 0644  BP:   Pulse: 69  Temp: 98.5 F (36.9 C)  Resp:      General: An obese, pleasant black male who is alert and very knowledgeable regarding his medical situation he is in no acute distress HEENT: pupils are equal round reactive to light symmetrical motions are intact, mucous memory to moist Neck: There is no jugular venous distention Heart: regular rate and rhythm Lungs: clear to auscultation bilaterally Abdomen: obese, soft, nontender Extremities: left leg with Ace wrap in place. Right leg with 1+ edema Skin: warm and dry Neuro: alert and oriented  Assessment/Plan: Very complicated 59 year old black male with history of a kidney transplant but also advanced chronic kidney disease 1.Renal- patient has experienced acute on chronic kidney disease in the setting of being postop from an orthopedic procedure. First thing I notices that his blood pressures quite low streaks B wonder if he's having decreased renal perfusion and that is the reason for his acute kidney injury. I will discontinue all antihypertensive medications at this time and resume them when appropriate. I will also check urinalysis. I am not suspicious of obstruction at this time. He has not had any disruption  in his antirejection medications. He is on the dosing that he takes at home and these will be continued. I'm not suspicious of rejection at this time. I am also going to stop his potassium supplementation because I do not want to get into trouble with hyperkalemia 2. Hypertension/volume  - postoperatively he's been very hypotensive. We'll discontinue his Coreg, Norvasc and hydralazine. Given that he has a little bit of lower extremity edema Will continue Lasix for now. I hope his low blood pressure is transient just from anesthesia  and pain medications and it will rebound back up. He is not really acting like an adrenal insufficiency at this time so will discontinue the prednisone 10 mg daily which is his usual dose 3. Anemia  - mostly due to surgery. Hemoglobin of month ago was 13. He is due to get transfused today 4. Infectious disease status post debridement and removal of infected hardware in his leg. He is currently on Zosyn 5. Bones- will continue his calcitriol for secondary hyperparathyroidism  Thank for this consultation. We will continue to follow with you   Marrian Bells A 03/16/2014, 12:18 PM

## 2014-03-17 LAB — TYPE AND SCREEN
ABO/RH(D): B POS
ANTIBODY SCREEN: NEGATIVE
UNIT DIVISION: 0
Unit division: 0

## 2014-03-17 LAB — BASIC METABOLIC PANEL
ANION GAP: 9 (ref 5–15)
BUN: 126 mg/dL — ABNORMAL HIGH (ref 6–23)
CALCIUM: 7.9 mg/dL — AB (ref 8.4–10.5)
CO2: 28 mmol/L (ref 19–32)
Chloride: 98 mmol/L (ref 96–112)
Creatinine, Ser: 6.22 mg/dL — ABNORMAL HIGH (ref 0.50–1.35)
GFR calc Af Amer: 10 mL/min — ABNORMAL LOW (ref >90)
GFR calc non Af Amer: 9 mL/min — ABNORMAL LOW (ref >90)
GLUCOSE: 103 mg/dL — AB (ref 70–99)
POTASSIUM: 5 mmol/L (ref 3.5–5.1)
SODIUM: 135 mmol/L (ref 135–145)

## 2014-03-17 LAB — CBC
HCT: 25.9 % — ABNORMAL LOW (ref 39.0–52.0)
HEMOGLOBIN: 8.4 g/dL — AB (ref 13.0–17.0)
MCH: 31.1 pg (ref 26.0–34.0)
MCHC: 32.4 g/dL (ref 30.0–36.0)
MCV: 95.9 fL (ref 78.0–100.0)
Platelets: 116 10*3/uL — ABNORMAL LOW (ref 150–400)
RBC: 2.7 MIL/uL — AB (ref 4.22–5.81)
RDW: 18.4 % — ABNORMAL HIGH (ref 11.5–15.5)
WBC: 16.5 10*3/uL — ABNORMAL HIGH (ref 4.0–10.5)

## 2014-03-17 MED ORDER — FUROSEMIDE 40 MG PO TABS
40.0000 mg | ORAL_TABLET | Freq: Two times a day (BID) | ORAL | Status: DC
  Administered 2014-03-17 – 2014-03-22 (×10): 40 mg via ORAL
  Filled 2014-03-17 (×10): qty 1

## 2014-03-17 MED ORDER — AMOXICILLIN-POT CLAVULANATE 500-125 MG PO TABS
1.0000 | ORAL_TABLET | Freq: Every day | ORAL | Status: DC
  Administered 2014-03-17 – 2014-03-22 (×6): 500 mg via ORAL
  Filled 2014-03-17 (×8): qty 1

## 2014-03-17 NOTE — Progress Notes (Signed)
[facility] Progress Note  Subjective  Feels well Some pain in L leg today, just got up in chair   Review of Systems  Constitutional: Negative for fever and chills.  Respiratory: Negative for shortness of breath and wheezing.   Cardiovascular: Negative for chest pain and palpitations.  Gastrointestinal: Negative for nausea, vomiting and abdominal pain.  Genitourinary: Negative for dysuria.  Neurological: Negative for tingling and sensory change.     Objective   BP 144/74 mmHg  Pulse 77  Temp(Src) 98.1 F (36.7 C) (Oral)  Resp 18  Ht 6' (1.829 m)  Wt 123.378 kg (272 lb)  BMI 36.88 kg/m2  SpO2 95%  Intake/Output      02/18 0701 - 02/19 0700 02/19 0701 - 02/20 0700   P.O. 360 260   I.V. (mL/kg) 1150 (9.3)    Blood 715    Total Intake(mL/kg) 2225 (18) 260 (2.1)   Urine (mL/kg/hr) 300 (0.1) 400 (0.4)   Total Output 300 400   Net +1925 -140          Labs  Results for Chad, [name] (MRN [id number]) as of 03/17/2014 16:10  Ref. Range 03/17/2014 06:51  Sodium Latest Range: 135-145 mmol/L 135  Potassium Latest Range: 3.5-5.1 mmol/L 5.0  Chloride Latest Range: 96-112 mmol/L 98  CO2 Latest Range: 19-32 mmol/L 28  BUN Latest Range: 6-23 mg/dL 485 (H)  Creatinine Latest Range: 0.50-1.35 mg/dL 4.62 (H)  Calcium Latest Range: 8.4-10.5 mg/dL 7.9 (L)  GFR calc non Af Amer Latest Range: >90 mL/min 9 (L)  GFR calc Af Amer Latest Range: >90 mL/min 10 (L)  Glucose Latest Range: 70-99 mg/dL 703 (H)  Anion gap Latest Range: 5-15  9  WBC Latest Range: 4.0-10.5 K/uL 16.5 (H)  RBC Latest Range: 4.22-5.81 MIL/uL 2.70 (L)  Hemoglobin Latest Range: 13.0-17.0 g/dL 8.4 (L)  HCT Latest Range: 39.0-52.0 % 25.9 (L)  MCV Latest Range: 78.0-100.0 fL 95.9  MCH Latest Range: 26.0-34.0 pg 31.1  MCHC Latest Range: 30.0-36.0 g/dL 50.0  RDW Latest Range: 11.5-15.5 % 18.4 (H)  Platelets Latest Range: 150-400 K/uL 116 (L)    Exam  Gen: awake and alert, sitting in bedside  chair, NAD Lungs: clear anterior fields  Cardiac: s1 and s2  Ext:       Left Lower Extremity   Dressing stable   Ext warm               + DP pulse             DPN, SPN, TN sensation grossly intact               EHL, FHL, AT, PT, peroneals, gastroc motor intact                        Distal edema unchanged     Assessment and Plan   POD/HD#: 2  59 y/o chronically ill black male with 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 Medical issues- CKD, Hep C, CHF,               Appreciate medicine and renal assistance with management of chronic medical issues             Home meds             Dc ASA due to ABL anemia and thrombocytopenia                            Renal following for Acute on chronic renal disease               Vitamin D levels are good and PTH levels look good for this particular pt   3. ABL anemia            improved after 2 units of prbc's yesterday              F/u cbc in am   Continue to hold ASA   4. DVT/PE prophylaxis             Lovenox   5. FEN               Advance diet as tolerated  6. Pain               Continue with current pain regimen   7. Dispo             F/u on cultures               Continue with current management    Appreciate Renal, ID, medicine assistance with pt   Would anticipate dc once renal function trends back to baseline   Pt states he prefers to dc to home once he is stable for discharge                Mearl Latin, PA-C [facility] [phone] (O) 03/17/2014 4:10 PM

## 2014-03-17 NOTE — Progress Notes (Signed)
Subjective:  Feels good, are breakfast.  BP is higher and he tells me he is making more urine.  Creatinine trending in the wrong direction however Objective Vital signs in last 24 hours: Filed Vitals:   03/16/14 2226 03/16/14 2251 03/17/14 0055 03/17/14 0600  BP: 123/67 126/68 124/63 135/71  Pulse: 66 68 68 68  Temp: 98.3 F (36.8 C) 98.5 F (36.9 C) 97.6 F (36.4 C) 98.1 F (36.7 C)  TempSrc: Oral Oral Oral   Resp: Height:      Weight:      SpO2: 96% 96% 97% 97%   Weight change:   Intake/Output Summary (Last 24 hours) at 03/17/14 1035 Last data filed at 03/17/14 0200  Gross per 24 hour  Intake   1865 ml  Output    300 ml  Net   1565 ml    Assessment/Plan: Very complicated 59 year old black male with history of a kidney transplant but also advanced chronic kidney disease 1.Renal- patient has experienced acute on chronic kidney disease in the setting of being postop from an orthopedic procedure. Possible low BP causing decreased renal perfusion and that is the reason for his acute kidney injury. I have discontinued all antihypertensive medications, blood pressure is better but does not need them resumed at this time.  Urinalysis was clear. I am not suspicious of obstruction at this time. He has not had any disruption in his antirejection medications. He is on the dosing that he takes at home and these will be continued. I'm not suspicious of rejection at this time. Even though numbers heading in wrong direction , he is not uremic and there are no indications for dialysis at this time.  He is quite confident that he will not require it, and that may be part of the issue of why he had not done any predialysis preparations. We will continue to follow his labs closely   2. Hypertension/volume - postoperatively he was very hypotensive. Have discontinued his Coreg, Norvasc and  hydralazine. Given that he has a little bit of lower extremity edema Will keep on Lasix for now, but  no zaroxolyn. I hope his low blood pressure is transient just from anesthesia and pain medications and it will rebound back up, has already improved. He is not really acting like an adrenal insufficiency at this time so will just continue the prednisone 10 mg daily which is his usual dose 3. Anemia - mostly due to surgery. Hemoglobin of month ago was 13. S/p transfusion yesterday with appropriate bump 4. Infectious disease status post debridement and removal of infected hardware in his leg. He is currently on Zosyn- ID following 5. Bones- will continue his calcitriol for secondary hyperparathyroidism    Reese Senk A    Labs: Basic Metabolic Panel:  Recent Labs Lab 03/15/14 0544 03/16/14 0723 03/17/14 0651  NA 136 134* 135  K 4.1 4.5 5.0  CL 96 96 98  CO2 32 29 28  GLUCOSE 158* 122* 103*  BUN 115* 125* 126*  CREATININE 5.04* 5.75* 6.22*  CALCIUM 8.2* 7.6* 7.9*   Liver Function Tests:  Recent Labs Lab 03/15/14 0544  AST 22  ALT 10  ALKPHOS 28*  BILITOT 0.7  PROT 5.6*  ALBUMIN 2.2*   No results for input(s): LIPASE, AMYLASE in the last 168 hours. No results for input(s): AMMONIA in the last 168 hours. CBC:  Recent Labs Lab 03/14/14 2119 03/15/14 0544 03/16/14 0723 03/17/14 0651  WBC 18.3* 14.6* 15.6* 16.5*  HGB 10.4* 8.2* 6.7* 8.4*  HCT 32.5* 25.5* 20.7* 25.9*  MCV 98.5 97.7 98.1 95.9  PLT 182 147* 120* 116*   Cardiac Enzymes: No results for input(s): CKTOTAL, CKMB, CKMBINDEX, TROPONINI in the last 168 hours. CBG: No results for input(s): GLUCAP in the last 168 hours.  Iron Studies:  Recent Labs  03/16/14 0723  IRON 30*  TIBC 180*  FERRITIN 240   Studies/Results: No results found. Medications: Infusions: . sodium chloride 50 mL/hr at 03/17/14 0100    Scheduled Medications: . allopurinol  100 mg Oral BID  . amiodarone  100 mg Oral Daily  . calcitRIOL  0.5 mcg Oral Daily  . Cholecalciferol  5,000 Units Oral Daily  . docusate sodium   100 mg Oral BID  . enoxaparin (LOVENOX) injection  30 mg Subcutaneous Q24H  . isosorbide mononitrate  60 mg Oral Daily  . piperacillin-tazobactam (ZOSYN)  IV  2.25 g Intravenous 4 times per day  . pravastatin  40 mg Oral QHS  . predniSONE  10 mg Oral Daily  . sirolimus  1.2 mg Oral q morning - 10a    have reviewed scheduled and prn medications.  Physical Exam: General: NAD, obese, sitting up in bed, getting bath Heart: RRR Lungs: clear Abdomen: soft, non tender Extremities: edema    03/17/2014,10:35 AM  LOS: 3 days

## 2014-03-17 NOTE — Progress Notes (Signed)
Patient ID: Chad [name], male   DOB: 06/09/1955, 59 y.o.   MRN: [id number]         [facility]    Date of Admission:  03/14/2014   Prolonged antibiotics        Day 3 piperacillin tazobactam and vancomycin  Active Problems:   Cardiomyopathy   Atrial flutter   CKD (chronic kidney disease)   Hypertension   History of renal transplant   Osteomyelitis   Broken femur   . allopurinol  100 mg Oral BID  . amiodarone  100 mg Oral Daily  . calcitRIOL  0.5 mcg Oral Daily  . Cholecalciferol  5,000 Units Oral Daily  . docusate sodium  100 mg Oral BID  . enoxaparin (LOVENOX) injection  30 mg Subcutaneous Q24H  . furosemide  40 mg Oral BID  . isosorbide mononitrate  60 mg Oral Daily  . piperacillin-tazobactam (ZOSYN)  IV  2.25 g Intravenous 4 times per day  . pravastatin  40 mg Oral QHS  . predniSONE  10 mg Oral Daily  . sirolimus  1.2 mg Oral q morning - 10a    Subjective: He is feeling well. He denies any pain.  Review of Systems: Pertinent items are noted in HPI.  Past Medical History  Diagnosis Date  . CKD (chronic kidney disease), stage IV     a. s/p renal transplant 1996.  Marland Kitchen HLD (hyperlipidemia)     takes Pravastatin daily  . Nonischemic cardiomyopathy     a. unknown etiology, EF 30-35% by echo 09/2011;  b. 09/2011 Lexi MV EF 42%, no ischemia/infarct.  . Systolic CHF     takes Lasix daily  . Atrial flutter     takes Amiodarone daily  . Gout     takes Allopurinol daily  . Hypertension     takes Amlodipine,Hydralazine,Imdur,and Coreg daily  . GERD (gastroesophageal reflux disease)     takes Protonix daily  . Shortness of breath dyspnea     exertion  . Pneumonia 2014  . History of bronchitis     several yrs ago  . Joint pain     right shoulder and left knee  . Sciatica   . Hepatitis C 1996    Hep C  . History of colon polyps   . History of kidney stones   . Urinary frequency   . History of shingles     History  Substance Use  Topics  . Smoking status: Never Smoker   . Smokeless tobacco: Never Used  . Alcohol Use: No    Family History  Problem Relation Age of Onset  . Heart disease Neg Hx    No Known Allergies  OBJECTIVE: Blood pressure 135/71, pulse 68, temperature 98.1 F (36.7 C), temperature source Oral, resp. rate 16, height 6' (1.829 m), weight 272 lb (123.378 kg), SpO2 97 %. General: He is comfortable and in no distress sitting up in bed.  Lab Results Lab Results  Component Value Date   WBC 16.5* 03/17/2014   HGB 8.4* 03/17/2014   HCT 25.9* 03/17/2014   MCV 95.9 03/17/2014   PLT 116* 03/17/2014    Lab Results  Component Value Date   CREATININE 6.22* 03/17/2014   BUN 126* 03/17/2014   NA 135 03/17/2014   K 5.0 03/17/2014   CL 98 03/17/2014   CO2 28 03/17/2014    Lab Results  Component Value Date   ALT 10 03/15/2014   AST 22 03/15/2014   ALKPHOS  28* 03/15/2014   BILITOT 0.7 03/15/2014     Microbiology: Recent Results (from the past 240 hour(s))  Anaerobic culture     Status: None (Preliminary result)   Collection Time: 03/14/14 12:40 PM  Result Value Ref Range Status   Specimen Description WOUND LEFT THIGH  Final   Special Requests NONE  Final   Gram Stain   Final    RARE WBC PRESENT, PREDOMINANTLY PMN NO SQUAMOUS EPITHELIAL CELLS SEEN NO ORGANISMS SEEN Performed at [facility]    Culture   Final    NO ANAEROBES ISOLATED; CULTURE IN PROGRESS FOR 5 DAYS Performed at [facility]    Report Status PENDING  Incomplete  Gram stain     Status: None   Collection Time: 03/14/14 12:40 PM  Result Value Ref Range Status   Specimen Description WOUND LEFT THIGH  Final   Special Requests NONE  Final   Gram Stain   Final    FEW WBC PRESENT, PREDOMINANTLY PMN NO ORGANISMS SEEN Gram Stain Report Called to,Read Back By and Verified With: D.JOHNSON,RN 03/14/14 1320 BY BSLADE    Report Status 03/14/2014 FINAL  Final  Wound culture     Status: None   Collection Time:  03/14/14 12:40 PM  Result Value Ref Range Status   Specimen Description WOUND LEFT THIGH  Final   Special Requests NONE  Final   Gram Stain   Final    FEW WBC PRESENT, PREDOMINANTLY PMN NO SQUAMOUS EPITHELIAL CELLS SEEN NO ORGANISMS SEEN Performed at [facility] Performed at [facility]    Culture   Final    NO GROWTH 2 DAYS Note: Gram Stain Report Called to,Read Back By and Verified With: D Laural Benes RN 03/14/14 1320 BY BSLADE Performed at [facility]    Report Status 03/16/2014 FINAL  Final  Gram stain     Status: None   Collection Time: 03/14/14  1:29 PM  Result Value Ref Range Status   Specimen Description TISSUE LEG LEFT  Final   Special Requests SPEC (C) PT ON VANCOMYCIN  Final   Gram Stain NO WBC SEEN NO ORGANISMS SEEN   Final   Report Status 03/14/2014 FINAL  Final  Anaerobic culture     Status: None (Preliminary result)   Collection Time: 03/14/14  1:29 PM  Result Value Ref Range Status   Specimen Description TISSUE LEG LEFT  Final   Special Requests NON UNION LEFT FEMUR PT ON VANCOMYCIN  Final   Gram Stain   Final    NO WBC SEEN NO ORGANISMS SEEN Performed at [facility] Performed at [facility]    Culture   Final    NO ANAEROBES ISOLATED; CULTURE IN PROGRESS FOR 5 DAYS Performed at [facility]    Report Status PENDING  Incomplete  Tissue culture     Status: None (Preliminary result)   Collection Time: 03/14/14  1:29 PM  Result Value Ref Range Status   Specimen Description TISSUE LEG LEFT  Final   Special Requests NON UNION LEFT FEMUR PT ON VANCOMYCIN  Final   Gram Stain   Final    NO WBC SEEN NO ORGANISMS SEEN Performed at [facility] Performed at [facility]    Culture   Final    NO GROWTH 2 DAYS Performed at [facility]    Report Status PENDING  Incomplete  Anaerobic culture     Status: None (Preliminary result)   Collection  Time: 03/14/14  1:37 PM  Result Value Ref  Range Status   Specimen Description TISSUE LEG LEFT  Final   Special Requests NON UNION LEFT FEMUR SPEC (B) PT ON VANCOMYCIN  Final   Gram Stain   Final    NO WBC SEEN NO ORGANISMS SEEN Performed at [facility] Performed at [facility]    Culture   Final    NO ANAEROBES ISOLATED; CULTURE IN PROGRESS FOR 5 DAYS Performed at [facility]    Report Status PENDING  Incomplete  Anaerobic culture     Status: None (Preliminary result)   Collection Time: 03/14/14  1:37 PM  Result Value Ref Range Status   Specimen Description TISSUE LEG LEFT  Final   Special Requests NON UNION LEFT FEMUR SPEC (D) PT ON VANCOMYCINA  Final   Gram Stain   Final    FEW WBC PRESENT, PREDOMINANTLY MONONUCLEAR NO ORGANISMS SEEN Performed at [facility] Performed at [facility]    Culture   Final    NO ANAEROBES ISOLATED; CULTURE IN PROGRESS FOR 5 DAYS Performed at [facility]    Report Status PENDING  Incomplete  Gram stain     Status: None   Collection Time: 03/14/14  1:37 PM  Result Value Ref Range Status   Specimen Description TISSUE LEG LEFT  Final   Special Requests NON UNION LEFT FEMUR SPEC (B) PT ON VANCOMYCIN  Final   Gram Stain NO WBC SEEN NO ORGANISMS SEEN   Final   Report Status 03/14/2014 FINAL  Final  Gram stain     Status: None   Collection Time: 03/14/14  1:37 PM  Result Value Ref Range Status   Specimen Description TISSUE LEG LEFT  Final   Special Requests NON UNION LEFT FEMUR SPEC (D) PT ON VANCOMYCIN  Final   Gram Stain   Final    FEW WBC PRESENT, PREDOMINANTLY MONONUCLEAR NO ORGANISMS SEEN    Report Status 03/15/2014 FINAL  Final  Tissue culture     Status: None (Preliminary result)   Collection Time: 03/14/14  1:37 PM  Result Value Ref Range Status   Specimen Description TISSUE LEG LEFT  Final   Special Requests NON UNION LEFT FEMUR SPEC (B) PT ON VANCOMYCIN  Final   Gram Stain   Final    NO WBC SEEN NO ORGANISMS  SEEN Performed at [facility] Performed at [facility]    Culture   Final    NO GROWTH 2 DAYS Performed at [facility]    Report Status PENDING  Incomplete  Tissue culture     Status: None (Preliminary result)   Collection Time: 03/14/14  1:37 PM  Result Value Ref Range Status   Specimen Description TISSUE LEG LEFT  Final   Special Requests NON UNION LEFT FEMUR SPEC (D) PT ON VANCOMYCIN  Final   Gram Stain   Final    FEW WBC PRESENT, PREDOMINANTLY MONONUCLEAR NO ORGANISMS SEEN Performed at [facility] Performed at [facility]    Culture   Final    NO GROWTH 2 DAYS Performed at [facility]    Report Status PENDING  Incomplete    Assessment: His operative Gram stain did not show any organisms and all tissue cultures are negative at 72 hours. I will change him back to renally dosed oral amoxicillin clavulanate to cover the enterococcus and Proteus that were isolated in July 2014.  Plan: 1.  Change antibiotics to amoxicillin clavulanate 2. Please call Dr. Enedina Finner [phone]) for any infectious disease questions this weekend  Cliffton Asters, MD [facility] for Infectious Disease [facility] [phone] pager   [phone] cell 03/17/2014, 11:41 AM

## 2014-03-17 NOTE — Progress Notes (Signed)
CARE MANAGEMENT NOTE 03/17/2014  Patient:  BOY, FABRY   Account Number:  [id number]  Date Initiated:  03/16/2014  Documentation initiated by:  [facility]  Subjective/Objective Assessment:   non union left femur, s/p hardware removal ORIF left hip  on home O2     Action/Plan:   PT/OT evals- recommended SNF  has worked with [facility] in the past   Anticipated DC Date:  03/18/2014   Anticipated DC Plan:  SKILLED NURSING FACILITY  In-house referral  Clinical Social Worker      DC Planning Services  CM consult                Status of service:  In process, will continue to follow Medicare Important Message given?  YES Date Medicare IM given:  03/17/2014 Medicare IM given by:  [facility]   Per UR Regulation:  Reviewed for med. necessity/level of care/duration of stay  If discussed at Long Length of Stay Meetings, dates discussed:    Comments:  03/16/14 PT recommended SNF. Referral made to CSW. Patient is allowing CSW to proceed with SNF process but stated that he would prefer to go home. CM will continue to follow patient's progress in case d/c plan changes to [facility].

## 2014-03-17 NOTE — Progress Notes (Signed)
Physical Therapy Treatment Patient Details Name: Chad [name] MRN: [id number] DOB: 09-23-1955 Today's Date: 03/17/2014    History of Present Illness 59 y/o black male with complex medical hx including Hepatitis C and renal transplant presents for repair of L distal femur nonunion. 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 Pt sustained a ground level fall in 2014 and had numerous procedures on this. Some question as to the presence of infection. Pt remains on abx. Continues to have pain and difficulty with daily activities. Now s/p partial escision of L distal femur and placement of antibiotic beads; NWB    PT Comments    Pt.  Declined mobility due to pain and throbbing in left leg near knee.  Pt. Noted to be shivering.  Additional blanket provided , pt. No longer shivering.  Pt. Agreeable to exercises and focus of repositioning of L LE with towel rolls flor additional comfort.  Pt. Now has his W/C here in the bathroom.  Would benefit from initiating transfer training into his w/c.    Follow Up Recommendations        Equipment Recommendations       Recommendations for Other Services       Precautions / Restrictions Precautions Precautions: Fall Restrictions Weight Bearing Restrictions: Yes LLE Weight Bearing: Non weight bearing    Mobility  Bed Mobility Overal bed mobility:  (mobiltity not assessed; pt. declined due to pain)                Transfers                    Ambulation/Gait                 Stairs            Wheelchair Mobility    Modified Rankin (Stroke Patients Only)       Balance                                    Cognition Arousal/Alertness: Awake/alert Behavior During Therapy: WFL for tasks assessed/performed Overall Cognitive Status: Within Functional Limits for tasks assessed                      Exercises General Exercises - Lower Extremity Ankle Circles/Pumps: AROM;Both;10 reps;Seated Quad  Sets: Right;10 reps;AROM Gluteal Sets: AROM;Both;10 reps    General Comments        Pertinent Vitals/Pain Pain Assessment: 0-10 Pain Score: 7  Pain Location: left knee Pain Descriptors / Indicators: Discomfort;Aching;Throbbing Pain Intervention(s): Limited activity within patient's tolerance;Monitored during session;Repositioned    Home Living                      Prior Function            PT Goals (current goals can now be found in the care plan section) Progress towards PT goals: Not progressing toward goals - comment (declined mobility today due to pain)    Frequency  Min 5X/week    PT Plan Current plan remains appropriate    Co-evaluation             End of Session   Activity Tolerance: Patient limited by pain Patient left: in chair;with call bell/phone within reach     Time: 1191-4782 PT Time Calculation (min) (ACUTE ONLY): 20 min  Charges:  $Therapeutic Exercise: 8-22 mins  G CodesFerman Hamming 03/17/2014, 4:40 PM Weldon Picking PT Acute Rehab Services [phone] Bee[place] [phone]

## 2014-03-18 DIAGNOSIS — I10 Essential (primary) hypertension: Secondary | ICD-10-CM

## 2014-03-18 DIAGNOSIS — M869 Osteomyelitis, unspecified: Secondary | ICD-10-CM

## 2014-03-18 LAB — TISSUE CULTURE
CULTURE: NO GROWTH
Culture: NO GROWTH
Culture: NO GROWTH
GRAM STAIN: NONE SEEN
Gram Stain: NONE SEEN

## 2014-03-18 LAB — URINALYSIS, ROUTINE W REFLEX MICROSCOPIC
BILIRUBIN URINE: NEGATIVE
Glucose, UA: NEGATIVE mg/dL
Hgb urine dipstick: NEGATIVE
Ketones, ur: NEGATIVE mg/dL
Leukocytes, UA: NEGATIVE
NITRITE: NEGATIVE
Protein, ur: 30 mg/dL — AB
Specific Gravity, Urine: 1.013 (ref 1.005–1.030)
Urobilinogen, UA: 0.2 mg/dL (ref 0.0–1.0)
pH: 5 (ref 5.0–8.0)

## 2014-03-18 LAB — URINE MICROSCOPIC-ADD ON

## 2014-03-18 LAB — LACTIC ACID, PLASMA: LACTIC ACID, VENOUS: 0.9 mmol/L (ref 0.5–2.0)

## 2014-03-18 LAB — GLUCOSE, CAPILLARY
GLUCOSE-CAPILLARY: 98 mg/dL (ref 70–99)
GLUCOSE-CAPILLARY: 121 mg/dL — AB (ref 70–99)

## 2014-03-18 LAB — HEMOGLOBIN A1C
HEMOGLOBIN A1C: 5.1 % (ref 4.8–5.6)
Mean Plasma Glucose: 100 mg/dL

## 2014-03-18 LAB — CBC
HCT: 24.2 % — ABNORMAL LOW (ref 39.0–52.0)
Hemoglobin: 8 g/dL — ABNORMAL LOW (ref 13.0–17.0)
MCH: 31.5 pg (ref 26.0–34.0)
MCHC: 33.1 g/dL (ref 30.0–36.0)
MCV: 95.3 fL (ref 78.0–100.0)
Platelets: 130 10*3/uL — ABNORMAL LOW (ref 150–400)
RBC: 2.54 MIL/uL — ABNORMAL LOW (ref 4.22–5.81)
RDW: 18.3 % — ABNORMAL HIGH (ref 11.5–15.5)
WBC: 18.1 10*3/uL — ABNORMAL HIGH (ref 4.0–10.5)

## 2014-03-18 LAB — BASIC METABOLIC PANEL
Anion gap: 13 (ref 5–15)
BUN: 125 mg/dL — ABNORMAL HIGH (ref 6–23)
CALCIUM: 8.3 mg/dL — AB (ref 8.4–10.5)
CO2: 27 mmol/L (ref 19–32)
CREATININE: 6.14 mg/dL — AB (ref 0.50–1.35)
Chloride: 95 mmol/L — ABNORMAL LOW (ref 96–112)
GFR calc Af Amer: 11 mL/min — ABNORMAL LOW (ref >90)
GFR, EST NON AFRICAN AMERICAN: 9 mL/min — AB (ref >90)
Glucose, Bld: 102 mg/dL — ABNORMAL HIGH (ref 70–99)
POTASSIUM: 4.5 mmol/L (ref 3.5–5.1)
SODIUM: 135 mmol/L (ref 135–145)

## 2014-03-18 LAB — AMMONIA: Ammonia: 23 umol/L (ref 11–32)

## 2014-03-18 MED ORDER — HYDROMORPHONE HCL 1 MG/ML IJ SOLN
0.5000 mg | INTRAMUSCULAR | Status: DC | PRN
  Administered 2014-03-22: 0.5 mg via INTRAVENOUS
  Filled 2014-03-18: qty 1

## 2014-03-18 MED ORDER — ACETAMINOPHEN 325 MG PO TABS
650.0000 mg | ORAL_TABLET | Freq: Four times a day (QID) | ORAL | Status: DC | PRN
  Administered 2014-03-18 – 2014-03-21 (×3): 650 mg via ORAL
  Filled 2014-03-18 (×3): qty 2

## 2014-03-18 NOTE — Significant Event (Signed)
Rapid Response Event Note  Overview: Time Called: 1320 Arrival Time: 1325 Event Type: Other (Comment)  Initial Focused Assessment:  Called by Rn to assess patient who is lethargic and exhibiting the shakes.  Upon my arrival to patients room, RN at bedside.  Patient states he feels cold, denies any pain or SOB. As per RN patient has been lethargic, currently patient seems to be pretty awake and on phone. Patient skin warm and dry.     Interventions: Rectal temp 100, recommend calling MD and update and   To see if they would like any cultures obtained.     Event Summary:  RN to call if assistance needed   at      at          [facility], Maryagnes Amos

## 2014-03-18 NOTE — Progress Notes (Addendum)
Called by nurse, patient sleepier this Pm.  During my eval, patient awake, will hold on transfer for now.Nephrology also saw (patient on phone and awake).  Pan culture and limit sedating meds. Get ammonia/lactic acid. -will transfer to sDU if change in status -patient similar to his AM presentation currently  Marlin Canary DO

## 2014-03-18 NOTE — Progress Notes (Signed)
Subjective:  Seems worse today, is twitchy, has hiccups- different from before- he blames it on pain meds-   he is making more urine.  Creatinine finally seeming to plateau  Objective Vital signs in last 24 hours: Filed Vitals:   03/17/14 0600 03/17/14 1214 03/17/14 2002 03/18/14 0504  BP: 135/71 144/74 140/75 134/65  Pulse: 68 77 78 87  Temp: 98.1 F (36.7 C) 98.1 F (36.7 C) 98 F (36.7 C) 99.2 F (37.3 C)  TempSrc:  Oral Axillary Oral  Resp: 16 18    Height:      Weight:      SpO2: 97% 95% 97% 96%   Weight change:   Intake/Output Summary (Last 24 hours) at 03/18/14 0958 Last data filed at 03/18/14 0506  Gross per 24 hour  Intake    500 ml  Output   1425 ml  Net   -925 ml    Assessment/Plan: Very complicated 59 year old black male with history of a kidney transplant but also advanced chronic kidney disease 1.Renal- patient has experienced acute on chronic kidney disease in the setting of being postop from an orthopedic procedure. Possible low BP causing decreased renal perfusion and that is the reason for his acute kidney injury. I have discontinued all antihypertensive medications, blood pressure is better but does not need them resumed at this time.  Urinalysis was clear. I am not suspicious of obstruction at this time. He has not had any disruption in his antirejection medications. He is on the dosing that he takes at home and these will be continued. I'm not suspicious of rejection at this time. Seeming to plateau but also, pt seems uremic to me. Patient blames it on the pain meds and it likely will be a hard sell to convince him it is uremia.   There are no acute indications for dialysis at this time.   We will continue to follow his labs closely - will check rapamune trough and d/c reglan in case it is causing some of the twitching  2. Hypertension/volume - postoperatively he was very hypotensive. Have discontinued his Coreg, Norvasc and  hydralazine. Given that he has a  little bit of lower extremity edema Will keep on Lasix for now, but no zaroxolyn. Is better.  He is not really acting like an adrenal insufficiency at this time so will just continue the prednisone 10 mg daily which is his usual dose 3. Anemia - mostly due to surgery. Hemoglobin of month ago was 13. S/p transfusion yesterday with appropriate bump 4. Infectious disease status post debridement and removal of infected hardware in his leg. He is currently on Zosyn- changed to augmentin-  ID following 5. Bones- will continue his calcitriol for secondary hyperparathyroidism    Chad [name] A    Labs: Basic Metabolic Panel:  Recent Labs Lab 03/16/14 0723 03/17/14 0651 03/18/14 0540  NA 134* 135 135  K 4.5 5.0 4.5  CL 96 98 95*  CO2 GLUCOSE 122* 103* 102*  BUN 125* 126* 125*  CREATININE 5.75* 6.22* 6.14*  CALCIUM 7.6* 7.9* 8.3*   Liver Function Tests:  Recent Labs Lab 03/15/14 0544  AST 22  ALT 10  ALKPHOS 28*  BILITOT 0.7  PROT 5.6*  ALBUMIN 2.2*   No results for input(s): LIPASE, AMYLASE in the last 168 hours. No results for input(s): AMMONIA in the last 168 hours. CBC:  Recent Labs Lab 03/14/14 2119 03/15/14 0544 03/16/14 0723 03/17/14 0651 03/18/14 0540  WBC 18.3* 14.6*  15.6* 16.5* 18.1*  HGB 10.4* 8.2* 6.7* 8.4* 8.0*  HCT 32.5* 25.5* 20.7* 25.9* 24.2*  MCV 98.5 97.7 98.1 95.9 95.3  PLT 182 147* 120* 116* 130*   Cardiac Enzymes: No results for input(s): CKTOTAL, CKMB, CKMBINDEX, TROPONINI in the last 168 hours. CBG:  Recent Labs Lab 03/18/14 0534  GLUCAP 98    Iron Studies:   Recent Labs  03/16/14 0723  IRON 30*  TIBC 180*  FERRITIN 240   Studies/Results: No results found. Medications: Infusions: . sodium chloride 50 mL/hr at 03/17/14 0100    Scheduled Medications: . allopurinol  100 mg Oral BID  . amiodarone  100 mg Oral Daily  . amoxicillin-clavulanate  1 tablet Oral Daily  . calcitRIOL  0.5 mcg Oral Daily  .  Cholecalciferol  5,000 Units Oral Daily  . docusate sodium  100 mg Oral BID  . enoxaparin (LOVENOX) injection  30 mg Subcutaneous Q24H  . furosemide  40 mg Oral BID  . isosorbide mononitrate  60 mg Oral Daily  . pravastatin  40 mg Oral QHS  . predniSONE  10 mg Oral Daily  . sirolimus  1.2 mg Oral q morning - 10a    have reviewed scheduled and prn medications.  Physical Exam: General: NAD, obese, more twitchy, tremulous with hiccups today Heart: RRR Lungs: clear Abdomen: soft, non tender Extremities: edema    03/18/2014,9:58 AM  LOS: 4 days

## 2014-03-18 NOTE — Progress Notes (Signed)
PT Cancellation Note  Patient Details Name: Chad [name] MRN: [id number] DOB: March 17, 1955   Cancelled Treatment:    Reason Eval/Treat Not Completed: Medical issues which prohibited therapy. RN recommending hold PT today due to decline in status.  Pt reported to be shaky and lethargic with an increased temp.    Ilda Foil 03/18/2014, 3:18 PM

## 2014-03-18 NOTE — Progress Notes (Signed)
[name] Consult Note   Chad [name]:[id number] DOB: 08-17-1955 DOA: 03/14/2014 PCP: [name], MD  Brief narrative: Chad [name] is a 59 y.o. male with CK D stage V status post renal transplant on immunosuppressive therapy, nonischemic cardiomyopathy with an EF of 30-35%, a flutter, hepatitis C as a result of the renal transplant, hypertension admitted to the orthopedic service and underwent partial excision of the left femur for chronic osteomyelitis, removal of hardware of-left femur and insertion of antibiotic beads. The hospitalist group has been asked to manage medical problems.   Subjective: Tremors today- blames pain meds  Assessment/Plan: Status post hardware removal and antibiotic bead with placement left leg ID following- abx per them  Acute on Chronic kidney disease stage IV with h/o renal transplant -baseline about 4 -Last creatinine checked in August 2015 was 3.95 -nephrology consult -? Uremia  Anemia- etiology uncertain but may be related to acute blood loss -transfuse  Hx of renal transplant continue rapamune and prednisone  Nonischemic cardiomyopathy hold Lasix and Zaroxolyn. Continue aspirin and statin. Monitor on telemetry.  Hyperlipidemia Continue statin  A flutter Rate control. Not on anticoagulation. Continue amiodarone.  Hypertension Continue amlodipine and Coreg  Gout Continue allopurinol  GERD Continue PPI    Code Status: *Full code Family Communication:  Disposition Plan: To be determined-based on ortho  DVT prophylaxis: Per orthopedics  Antibiotics: Anti-infectives    Start     Dose/Rate Route Frequency Ordered Stop   03/17/14 1300  amoxicillin-clavulanate (AUGMENTIN) 500-125 MG per tablet 500 mg     1 tablet Oral Daily 03/17/14 1146     03/14/14 2200  vancomycin (VANCOCIN) 1,500 mg in sodium chloride 0.9 % 500 mL IVPB     1,500 mg 250 mL/hr over 120 Minutes Intravenous  Once 03/14/14 2021 03/15/14 0110   03/14/14 2100  piperacillin-tazobactam (ZOSYN) IVPB 2.25 g  Status:  Discontinued     2.25 g 100 mL/hr over 30 Minutes Intravenous 4 times per day 03/14/14 2021 03/17/14 1146   03/14/14 1421  vancomycin (VANCOCIN) powder  Status:  Discontinued       As needed 03/14/14 1421 03/14/14 1521   03/14/14 1414  tobramycin (NEBCIN) powder  Status:  Discontinued       As needed 03/14/14 1415 03/14/14 1521   03/14/14 1330  vancomycin (VANCOCIN) IVPB 1000 mg/200 mL premix     1,000 mg 200 mL/hr over 60 Minutes Intravenous To Surgery 03/14/14 1323 03/14/14 1328         Objective: Filed Weights   03/14/14 0931  Weight: 123.378 kg (272 lb)    Intake/Output Summary (Last 24 hours) at 03/18/14 1026 Last data filed at 03/18/14 0506  Gross per 24 hour  Intake    500 ml  Output   1425 ml  Net   -925 ml     Vitals Filed Vitals:   03/17/14 0600 03/17/14 1214 03/17/14 2002 03/18/14 0504  BP: 135/71 144/74 140/75 134/65  Pulse: 68 77 78 87  Temp: 98.1 F (36.7 C) 98.1 F (36.7 C) 98 F (36.7 C) 99.2 F (37.3 C)  TempSrc:  Oral Axillary Oral  Resp: 16 18    Height:      Weight:      SpO2: 97% 95% 97% 96%    Exam: General: Awake but with tremors Lungs: Clear to auscultation bilaterally without wheezes or crackles Cardiovascular: Regular rate and rhythm without murmur gallop or rub normal S1 and S2 Abdomen: Nontender, nondistended, soft, bowel sounds  positive, no rebound, no ascites, no appreciable mass Extremities: No significant cyanosis, clubbing, or edema bilateral lower extremities- left leg and foot wrapped in Ace wrap  Data Reviewed: Basic Metabolic Panel:  Recent Labs Lab 03/14/14 0932 03/14/14 2119 03/15/14 0544 03/16/14 0723 03/17/14 0651 03/18/14 0540  NA  --   --  136 134* 135 135  K  --   --  4.1 4.5 5.0 4.5  CL  --   --  96 96 98 95*  CO2  --   --  32 GLUCOSE  --   --  158* 122* 103* 102*  BUN  --   --  115* 125* 126* 125*  CREATININE  --  4.94*  5.04* 5.75* 6.22* 6.14*  CALCIUM 9.4  --  8.2* 7.6* 7.9* 8.3*   Liver Function Tests:  Recent Labs Lab 03/15/14 0544  AST 22  ALT 10  ALKPHOS 28*  BILITOT 0.7  PROT 5.6*  ALBUMIN 2.2*   No results for input(s): LIPASE, AMYLASE in the last 168 hours. No results for input(s): AMMONIA in the last 168 hours. CBC:  Recent Labs Lab 03/14/14 2119 03/15/14 0544 03/16/14 0723 03/17/14 0651 03/18/14 0540  WBC 18.3* 14.6* 15.6* 16.5* 18.1*  HGB 10.4* 8.2* 6.7* 8.4* 8.0*  HCT 32.5* 25.5* 20.7* 25.9* 24.2*  MCV 98.5 97.7 98.1 95.9 95.3  PLT 182 147* 120* 116* 130*   Cardiac Enzymes: No results for input(s): CKTOTAL, CKMB, CKMBINDEX, TROPONINI in the last 168 hours. BNP (last 3 results) No results for input(s): BNP in the last 8760 hours.  ProBNP (last 3 results) No results for input(s): PROBNP in the last 8760 hours.  CBG:  Recent Labs Lab 03/18/14 0534  GLUCAP 98    Recent Results (from the past 240 hour(s))  Anaerobic culture     Status: None (Preliminary result)   Collection Time: 03/14/14 12:40 PM  Result Value Ref Range Status   Specimen Description WOUND LEFT THIGH  Final   Special Requests NONE  Final   Gram Stain   Final    RARE WBC PRESENT, PREDOMINANTLY PMN NO SQUAMOUS EPITHELIAL CELLS SEEN NO ORGANISMS SEEN Performed at [facility]    Culture   Final    NO ANAEROBES ISOLATED; CULTURE IN PROGRESS FOR 5 DAYS Performed at [facility]    Report Status PENDING  Incomplete  Gram stain     Status: None   Collection Time: 03/14/14 12:40 PM  Result Value Ref Range Status   Specimen Description WOUND LEFT THIGH  Final   Special Requests NONE  Final   Gram Stain   Final    FEW WBC PRESENT, PREDOMINANTLY PMN NO ORGANISMS SEEN Gram Stain Report Called to,Read Back By and Verified With: D.JOHNSON,RN 03/14/14 1320 BY BSLADE    Report Status 03/14/2014 FINAL  Final  Wound culture     Status: None   Collection Time: 03/14/14 12:40 PM  Result  Value Ref Range Status   Specimen Description WOUND LEFT THIGH  Final   Special Requests NONE  Final   Gram Stain   Final    FEW WBC PRESENT, PREDOMINANTLY PMN NO SQUAMOUS EPITHELIAL CELLS SEEN NO ORGANISMS SEEN Performed at [facility] Performed at [facility]    Culture   Final    NO GROWTH 2 DAYS Note: Gram Stain Report Called to,Read Back By and Verified With: D Laural Benes RN 03/14/14 1320 BY BSLADE Performed at [facility]  Report Status 03/16/2014 FINAL  Final  Gram stain     Status: None   Collection Time: 03/14/14  1:29 PM  Result Value Ref Range Status   Specimen Description TISSUE LEG LEFT  Final   Special Requests SPEC (C) PT ON VANCOMYCIN  Final   Gram Stain NO WBC SEEN NO ORGANISMS SEEN   Final   Report Status 03/14/2014 FINAL  Final  Anaerobic culture     Status: None (Preliminary result)   Collection Time: 03/14/14  1:29 PM  Result Value Ref Range Status   Specimen Description TISSUE LEG LEFT  Final   Special Requests NON UNION LEFT FEMUR PT ON VANCOMYCIN  Final   Gram Stain   Final    NO WBC SEEN NO ORGANISMS SEEN Performed at [facility] Performed at [facility]    Culture   Final    NO ANAEROBES ISOLATED; CULTURE IN PROGRESS FOR 5 DAYS Performed at [facility]    Report Status PENDING  Incomplete  Tissue culture     Status: None   Collection Time: 03/14/14  1:29 PM  Result Value Ref Range Status   Specimen Description TISSUE LEG LEFT  Final   Special Requests NON UNION LEFT FEMUR PT ON VANCOMYCIN  Final   Gram Stain   Final    NO WBC SEEN NO ORGANISMS SEEN Performed at [facility] Performed at [facility]    Culture   Final    NO GROWTH 3 DAYS Performed at [facility]    Report Status 03/18/2014 FINAL  Final  Anaerobic culture     Status: None (Preliminary result)   Collection Time: 03/14/14  1:37 PM  Result Value Ref Range Status   Specimen Description TISSUE LEG  LEFT  Final   Special Requests NON UNION LEFT FEMUR SPEC (B) PT ON VANCOMYCIN  Final   Gram Stain   Final    NO WBC SEEN NO ORGANISMS SEEN Performed at [facility] Performed at [facility]    Culture   Final    NO ANAEROBES ISOLATED; CULTURE IN PROGRESS FOR 5 DAYS Performed at [facility]    Report Status PENDING  Incomplete  Anaerobic culture     Status: None (Preliminary result)   Collection Time: 03/14/14  1:37 PM  Result Value Ref Range Status   Specimen Description TISSUE LEG LEFT  Final   Special Requests NON UNION LEFT FEMUR SPEC (D) PT ON VANCOMYCINA  Final   Gram Stain   Final    FEW WBC PRESENT, PREDOMINANTLY MONONUCLEAR NO ORGANISMS SEEN Performed at [facility] Performed at [facility]    Culture   Final    NO ANAEROBES ISOLATED; CULTURE IN PROGRESS FOR 5 DAYS Performed at [facility]    Report Status PENDING  Incomplete  Gram stain     Status: None   Collection Time: 03/14/14  1:37 PM  Result Value Ref Range Status   Specimen Description TISSUE LEG LEFT  Final   Special Requests NON UNION LEFT FEMUR SPEC (B) PT ON VANCOMYCIN  Final   Gram Stain NO WBC SEEN NO ORGANISMS SEEN   Final   Report Status 03/14/2014 FINAL  Final  Gram stain     Status: None   Collection Time: 03/14/14  1:37 PM  Result Value Ref Range Status   Specimen Description TISSUE LEG LEFT  Final   Special Requests NON UNION LEFT FEMUR SPEC (  D) PT ON VANCOMYCIN  Final   Gram Stain   Final    FEW WBC PRESENT, PREDOMINANTLY MONONUCLEAR NO ORGANISMS SEEN    Report Status 03/15/2014 FINAL  Final  Tissue culture     Status: None   Collection Time: 03/14/14  1:37 PM  Result Value Ref Range Status   Specimen Description TISSUE LEG LEFT  Final   Special Requests NON UNION LEFT FEMUR SPEC (B) PT ON VANCOMYCIN  Final   Gram Stain   Final    NO WBC SEEN NO ORGANISMS SEEN Performed at [facility] Performed at [facility]     Culture   Final    NO GROWTH 3 DAYS Performed at [facility]    Report Status 03/18/2014 FINAL  Final  Tissue culture     Status: None   Collection Time: 03/14/14  1:37 PM  Result Value Ref Range Status   Specimen Description TISSUE LEG LEFT  Final   Special Requests NON UNION LEFT FEMUR SPEC (D) PT ON VANCOMYCIN  Final   Gram Stain   Final    FEW WBC PRESENT, PREDOMINANTLY MONONUCLEAR NO ORGANISMS SEEN Performed at [facility] Performed at [facility]    Culture   Final    NO GROWTH 3 DAYS Performed at [facility]    Report Status 03/18/2014 FINAL  Final       Scheduled Meds:  Scheduled Meds: . allopurinol  100 mg Oral BID  . amiodarone  100 mg Oral Daily  . amoxicillin-clavulanate  1 tablet Oral Daily  . calcitRIOL  0.5 mcg Oral Daily  . Cholecalciferol  5,000 Units Oral Daily  . docusate sodium  100 mg Oral BID  . enoxaparin (LOVENOX) injection  30 mg Subcutaneous Q24H  . furosemide  40 mg Oral BID  . isosorbide mononitrate  60 mg Oral Daily  . pravastatin  40 mg Oral QHS  . predniSONE  10 mg Oral Daily  . sirolimus  1.2 mg Oral q morning - 10a   Continuous Infusions: . sodium chloride 50 mL/hr at 03/17/14 0100    Time spent on care of this patient: 35 min   Steffan Caniglia, DO  03/18/2014, 10:26 AM  LOS: 4 days   [name] Office  [phone] Pager - Text Page per [email or web address]  If 7PM-7AM, please contact night-coverage [email or web address]

## 2014-03-18 NOTE — Progress Notes (Signed)
SPORTS MEDICINE AND JOINT REPLACEMENT  Georgena Spurling, MD   Altamese Cabal, PA-C [address] [place], [place], [place]  [address]                             [phone]   PROGRESS NOTE  Subjective:  negative for Chest Pain  negative for Shortness of Breath  negative for Nausea/Vomiting   negative for Calf Pain  negative for Bowel Movement   Tolerating Diet: yes         Patient reports pain as 7 on 0-10 scale.    Objective: Vital signs in last 24 hours:   Patient Vitals for the past 24 hrs:  BP Temp Temp src Pulse Resp SpO2  03/18/14 0504 134/65 mmHg 99.2 F (37.3 C) Oral 87 - 96 %  03/17/14 2002 140/75 mmHg 98 F (36.7 C) Axillary 78 - 97 %  03/17/14 1214 (!) 144/74 mmHg 98.1 F (36.7 C) Oral 77 18 95 %    @flow {1959:LAST@   Intake/Output from previous day:   02/19 0701 - 02/20 0700 In: 760 [P.O.:760] Out: 1625 [Urine:1625]   Intake/Output this shift:       Intake/Output      02/19 0701 - 02/20 0700 02/20 0701 - 02/21 0700   P.O. 760    I.V. (mL/kg)     Blood     Total Intake(mL/kg) 760 (6.2)    Urine (mL/kg/hr) 1625 (0.5)    Total Output 1625     Net -865             LABORATORY DATA:  Recent Labs  03/14/14 2119 03/15/14 0544 03/16/14 0723 03/17/14 0651 03/18/14 0540  WBC 18.3* 14.6* 15.6* 16.5* 18.1*  HGB 10.4* 8.2* 6.7* 8.4* 8.0*  HCT 32.5* 25.5* 20.7* 25.9* 24.2*  PLT 182 147* 120* 116* 130*    Recent Labs  03/14/14 0932 03/14/14 2119 03/15/14 0544 03/16/14 0723 03/17/14 0651 03/18/14 0540  NA  --   --  136 134* 135 135  K  --   --  4.1 4.5 5.0 4.5  CL  --   --  96 96 98 95*  CO2  --   --  32 29 28 27   BUN  --   --  115* 125* 126* 125*  CREATININE  --  4.94* 5.04* 5.75* 6.22* 6.14*  GLUCOSE  --   --  158* 122* 103* 102*  CALCIUM 9.4  --  8.2* 7.6* 7.9* 8.3*   Lab Results  Component Value Date   INR 1.05 03/06/2014   INR 1.16 06/15/2011   INR 1.27 06/08/2011    Examination:  General appearance: alert, cooperative and no  distress Extremities: Homans sign is negative, no sign of DVT  Wound Exam: clean, dry, intact   Drainage:  Scant/small amount Serosanguinous exudate  Motor Exam: EHL and FHL Intact  Sensory Exam: Deep Peroneal normal   Assessment:    4 Days Post-Op  Procedure(s) (LRB): HARDWARE REMOVAL LEFT (Left) DEBRIDEMENT WOUND (Left)  ADDITIONAL DIAGNOSIS:  Active Problems:   Cardiomyopathy   Atrial flutter   CKD (chronic kidney disease)   Hypertension   History of renal transplant   Osteomyelitis   Broken femur  Acute Blood Loss Anemia   Plan: Physical Therapy as ordered Non Weight Bearing (NWB)           Chanin Frumkin 03/18/2014, 9:51 AM

## 2014-03-18 NOTE — Progress Notes (Signed)
Noted that patient was very shaky and lethargic. This appeared to be different then patient's baseline. Rectal temperature 100.3. Left side appear more shaky then the right, but patient just complained of being cold. Rapid response arrived and advised nurse to contact hospitalist. Dr. Benjamine Mola was contacted and suggested Tylenol and cultures will be repeated for patient. Will continue to monitor patient.

## 2014-03-19 LAB — RENAL FUNCTION PANEL
Albumin: 2.1 g/dL — ABNORMAL LOW (ref 3.5–5.2)
Anion gap: 13 (ref 5–15)
BUN: 118 mg/dL — ABNORMAL HIGH (ref 6–23)
CHLORIDE: 96 mmol/L (ref 96–112)
CO2: 27 mmol/L (ref 19–32)
Calcium: 8.4 mg/dL (ref 8.4–10.5)
Creatinine, Ser: 5.95 mg/dL — ABNORMAL HIGH (ref 0.50–1.35)
GFR calc non Af Amer: 9 mL/min — ABNORMAL LOW (ref >90)
GFR, EST AFRICAN AMERICAN: 11 mL/min — AB (ref >90)
GLUCOSE: 88 mg/dL (ref 70–99)
POTASSIUM: 3.8 mmol/L (ref 3.5–5.1)
Phosphorus: 8 mg/dL — ABNORMAL HIGH (ref 2.3–4.6)
Sodium: 136 mmol/L (ref 135–145)

## 2014-03-19 LAB — ANAEROBIC CULTURE
Gram Stain: NONE SEEN
Gram Stain: NONE SEEN

## 2014-03-19 MED ORDER — SEVELAMER CARBONATE 800 MG PO TABS
1600.0000 mg | ORAL_TABLET | Freq: Three times a day (TID) | ORAL | Status: DC
  Administered 2014-03-19 – 2014-03-22 (×8): 1600 mg via ORAL
  Filled 2014-03-19 (×13): qty 2

## 2014-03-19 NOTE — Progress Notes (Signed)
[name] Consult Note   Chad [name]:[id number] DOB: 11/18/1955 DOA: 03/14/2014 PCP: [name], MD  Brief narrative: Chad [name] is a 59 y.o. male with CK D stage V status post renal transplant on immunosuppressive therapy, nonischemic cardiomyopathy with an EF of 30-35%, a flutter, hepatitis C as a result of the renal transplant, hypertension admitted to the orthopedic service and underwent partial excision of the left femur for chronic osteomyelitis, removal of hardware left femur and insertion of antibiotic beads. The hospitalist group has been asked to manage medical problems.   Subjective: Less tremulous today  Assessment/Plan: Status post hardware removal and antibiotic bead with placement left leg ID following- abx per them - still with low grade fevers  Acute on Chronic kidney disease stage IV with h/o renal transplant -baseline about 4 -Last creatinine checked in August 2015 was 3.95 -nephrology consult  Anemia- etiology uncertain but may be related to acute blood loss -transfuse  Hx of renal transplant continue rapamune and prednisone  Nonischemic cardiomyopathy hold Lasix and Zaroxolyn. Continue aspirin and statin. Monitor on telemetry.  Hyperlipidemia Continue statin  A flutter Rate control. Not on anticoagulation. Continue amiodarone.  Hypertension Continue amlodipine and Coreg  Gout Continue allopurinol  GERD Continue PPI    Code Status: Full code Family Communication:  Disposition Plan: To be determined-based on ortho  DVT prophylaxis: Per orthopedics  Antibiotics: Anti-infectives    Start     Dose/Rate Route Frequency Ordered Stop   03/17/14 1300  amoxicillin-clavulanate (AUGMENTIN) 500-125 MG per tablet 500 mg     1 tablet Oral Daily 03/17/14 1146     03/14/14 2200  vancomycin (VANCOCIN) 1,500 mg in sodium chloride 0.9 % 500 mL IVPB     1,500 mg 250 mL/hr over 120 Minutes Intravenous  Once 03/14/14 2021 03/15/14 0110    03/14/14 2100  piperacillin-tazobactam (ZOSYN) IVPB 2.25 g  Status:  Discontinued     2.25 g 100 mL/hr over 30 Minutes Intravenous 4 times per day 03/14/14 2021 03/17/14 1146   03/14/14 1421  vancomycin (VANCOCIN) powder  Status:  Discontinued       As needed 03/14/14 1421 03/14/14 1521   03/14/14 1414  tobramycin (NEBCIN) powder  Status:  Discontinued       As needed 03/14/14 1415 03/14/14 1521   03/14/14 1330  vancomycin (VANCOCIN) IVPB 1000 mg/200 mL premix     1,000 mg 200 mL/hr over 60 Minutes Intravenous To Surgery 03/14/14 1323 03/14/14 1328         Objective: Filed Weights   03/14/14 0931  Weight: 123.378 kg (272 lb)    Intake/Output Summary (Last 24 hours) at 03/19/14 9563 Last data filed at 03/19/14 0826  Gross per 24 hour  Intake   1010 ml  Output    900 ml  Net    110 ml     Vitals Filed Vitals:   03/18/14 1250 03/18/14 1347 03/18/14 1939 03/19/14 0511  BP: 139/78  158/86 129/63  Pulse: 105  93 87  Temp: 99.3 F (37.4 C) 100.3 F (37.9 C) 99 F (37.2 C) 99.9 F (37.7 C)  TempSrc: Oral Rectal Oral Oral  Resp: 18  18 18   Height:      Weight:      SpO2: 97%  97% 97%    Exam: General: Awake  Lungs: Clear to auscultation bilaterally without wheezes or crackles Cardiovascular: Regular rate and rhythm without murmur gallop or rub normal S1 and S2 Abdomen: Nontender, nondistended, soft,  bowel sounds positive, no rebound, no ascites, no appreciable mass Extremities: No significant cyanosis, clubbing, or edema bilateral lower extremities- left leg and foot wrapped in Ace wrap  Data Reviewed: Basic Metabolic Panel:  Recent Labs Lab 03/15/14 0544 03/16/14 0723 03/17/14 0651 03/18/14 0540 03/19/14 0625  NA 136 134* 135 135 136  K 4.1 4.5 5.0 4.5 3.8  CL 96 96 98 95* 96  CO2 32 GLUCOSE 158* 122* 103* 102* 88  BUN 115* 125* 126* 125* 118*  CREATININE 5.04* 5.75* 6.22* 6.14* 5.95*  CALCIUM 8.2* 7.6* 7.9* 8.3* 8.4  PHOS  --   --   --    --  8.0*   Liver Function Tests:  Recent Labs Lab 03/15/14 0544 03/19/14 0625  AST 22  --   ALT 10  --   ALKPHOS 28*  --   BILITOT 0.7  --   PROT 5.6*  --   ALBUMIN 2.2* 2.1*   No results for input(s): LIPASE, AMYLASE in the last 168 hours.  Recent Labs Lab 03/18/14 1541  AMMONIA 23   CBC:  Recent Labs Lab 03/14/14 2119 03/15/14 0544 03/16/14 0723 03/17/14 0651 03/18/14 0540  WBC 18.3* 14.6* 15.6* 16.5* 18.1*  HGB 10.4* 8.2* 6.7* 8.4* 8.0*  HCT 32.5* 25.5* 20.7* 25.9* 24.2*  MCV 98.5 97.7 98.1 95.9 95.3  PLT 182 147* 120* 116* 130*   Cardiac Enzymes: No results for input(s): CKTOTAL, CKMB, CKMBINDEX, TROPONINI in the last 168 hours. BNP (last 3 results) No results for input(s): BNP in the last 8760 hours.  ProBNP (last 3 results) No results for input(s): PROBNP in the last 8760 hours.  CBG:  Recent Labs Lab 03/18/14 0534 03/18/14 1339  GLUCAP 98 121*    Recent Results (from the past 240 hour(s))  Anaerobic culture     Status: None (Preliminary result)   Collection Time: 03/14/14 12:40 PM  Result Value Ref Range Status   Specimen Description WOUND LEFT THIGH  Final   Special Requests NONE  Final   Gram Stain   Final    RARE WBC PRESENT, PREDOMINANTLY PMN NO SQUAMOUS EPITHELIAL CELLS SEEN NO ORGANISMS SEEN Performed at [facility]    Culture   Final    NO ANAEROBES ISOLATED; CULTURE IN PROGRESS FOR 5 DAYS Performed at [facility]    Report Status PENDING  Incomplete  Gram stain     Status: None   Collection Time: 03/14/14 12:40 PM  Result Value Ref Range Status   Specimen Description WOUND LEFT THIGH  Final   Special Requests NONE  Final   Gram Stain   Final    FEW WBC PRESENT, PREDOMINANTLY PMN NO ORGANISMS SEEN Gram Stain Report Called to,Read Back By and Verified With: D.JOHNSON,RN 03/14/14 1320 BY BSLADE    Report Status 03/14/2014 FINAL  Final  Wound culture     Status: None   Collection Time: 03/14/14 12:40 PM   Result Value Ref Range Status   Specimen Description WOUND LEFT THIGH  Final   Special Requests NONE  Final   Gram Stain   Final    FEW WBC PRESENT, PREDOMINANTLY PMN NO SQUAMOUS EPITHELIAL CELLS SEEN NO ORGANISMS SEEN Performed at [facility] Performed at [facility]    Culture   Final    NO GROWTH 2 DAYS Note: Gram Stain Report Called to,Read Back By and Verified With: D Laural Benes RN 03/14/14 1320 BY BSLADE Performed at [facility]  Report Status 03/16/2014 FINAL  Final  Gram stain     Status: None   Collection Time: 03/14/14  1:29 PM  Result Value Ref Range Status   Specimen Description TISSUE LEG LEFT  Final   Special Requests SPEC (C) PT ON VANCOMYCIN  Final   Gram Stain NO WBC SEEN NO ORGANISMS SEEN   Final   Report Status 03/14/2014 FINAL  Final  Anaerobic culture     Status: None (Preliminary result)   Collection Time: 03/14/14  1:29 PM  Result Value Ref Range Status   Specimen Description TISSUE LEG LEFT  Final   Special Requests NON UNION LEFT FEMUR PT ON VANCOMYCIN  Final   Gram Stain   Final    NO WBC SEEN NO ORGANISMS SEEN Performed at [facility] Performed at [facility]    Culture   Final    NO ANAEROBES ISOLATED; CULTURE IN PROGRESS FOR 5 DAYS Performed at [facility]    Report Status PENDING  Incomplete  Tissue culture     Status: None   Collection Time: 03/14/14  1:29 PM  Result Value Ref Range Status   Specimen Description TISSUE LEG LEFT  Final   Special Requests NON UNION LEFT FEMUR PT ON VANCOMYCIN  Final   Gram Stain   Final    NO WBC SEEN NO ORGANISMS SEEN Performed at [facility] Performed at [facility]    Culture   Final    NO GROWTH 3 DAYS Performed at [facility]    Report Status 03/18/2014 FINAL  Final  Anaerobic culture     Status: None (Preliminary result)   Collection Time: 03/14/14  1:37 PM  Result Value Ref Range Status   Specimen Description  TISSUE LEG LEFT  Final   Special Requests NON UNION LEFT FEMUR SPEC (B) PT ON VANCOMYCIN  Final   Gram Stain   Final    NO WBC SEEN NO ORGANISMS SEEN Performed at [facility] Performed at [facility]    Culture   Final    NO ANAEROBES ISOLATED; CULTURE IN PROGRESS FOR 5 DAYS Performed at [facility]    Report Status PENDING  Incomplete  Anaerobic culture     Status: None (Preliminary result)   Collection Time: 03/14/14  1:37 PM  Result Value Ref Range Status   Specimen Description TISSUE LEG LEFT  Final   Special Requests NON UNION LEFT FEMUR SPEC (D) PT ON VANCOMYCINA  Final   Gram Stain   Final    FEW WBC PRESENT, PREDOMINANTLY MONONUCLEAR NO ORGANISMS SEEN Performed at [facility] Performed at [facility]    Culture   Final    NO ANAEROBES ISOLATED; CULTURE IN PROGRESS FOR 5 DAYS Performed at [facility]    Report Status PENDING  Incomplete  Gram stain     Status: None   Collection Time: 03/14/14  1:37 PM  Result Value Ref Range Status   Specimen Description TISSUE LEG LEFT  Final   Special Requests NON UNION LEFT FEMUR SPEC (B) PT ON VANCOMYCIN  Final   Gram Stain NO WBC SEEN NO ORGANISMS SEEN   Final   Report Status 03/14/2014 FINAL  Final  Gram stain     Status: None   Collection Time: 03/14/14  1:37 PM  Result Value Ref Range Status   Specimen Description TISSUE LEG LEFT  Final   Special Requests NON UNION LEFT FEMUR SPEC (  D) PT ON VANCOMYCIN  Final   Gram Stain   Final    FEW WBC PRESENT, PREDOMINANTLY MONONUCLEAR NO ORGANISMS SEEN    Report Status 03/15/2014 FINAL  Final  Tissue culture     Status: None   Collection Time: 03/14/14  1:37 PM  Result Value Ref Range Status   Specimen Description TISSUE LEG LEFT  Final   Special Requests NON UNION LEFT FEMUR SPEC (B) PT ON VANCOMYCIN  Final   Gram Stain   Final    NO WBC SEEN NO ORGANISMS SEEN Performed at [facility] Performed at [facility]    Culture   Final    NO GROWTH 3 DAYS Performed at [facility]    Report Status 03/18/2014 FINAL  Final  Tissue culture     Status: None   Collection Time: 03/14/14  1:37 PM  Result Value Ref Range Status   Specimen Description TISSUE LEG LEFT  Final   Special Requests NON UNION LEFT FEMUR SPEC (D) PT ON VANCOMYCIN  Final   Gram Stain   Final    FEW WBC PRESENT, PREDOMINANTLY MONONUCLEAR NO ORGANISMS SEEN Performed at [facility] Performed at [facility]    Culture   Final    NO GROWTH 3 DAYS Performed at [facility]    Report Status 03/18/2014 FINAL  Final  Culture, blood (routine x 2)     Status: None (Preliminary result)   Collection Time: 03/18/14  3:34 PM  Result Value Ref Range Status   Specimen Description BLOOD RIGHT HAND  Final   Special Requests AEB 2CC  Final   Culture   Final           BLOOD CULTURE RECEIVED NO GROWTH TO DATE CULTURE WILL BE HELD FOR 5 DAYS BEFORE ISSUING A FINAL NEGATIVE REPORT Performed at [facility]    Report Status PENDING  Incomplete       Scheduled Meds:  Scheduled Meds: . allopurinol  100 mg Oral BID  . amiodarone  100 mg Oral Daily  . amoxicillin-clavulanate  1 tablet Oral Daily  . calcitRIOL  0.5 mcg Oral Daily  . Cholecalciferol  5,000 Units Oral Daily  . docusate sodium  100 mg Oral BID  . enoxaparin (LOVENOX) injection  30 mg Subcutaneous Q24H  . furosemide  40 mg Oral BID  . isosorbide mononitrate  60 mg Oral Daily  . pravastatin  40 mg Oral QHS  . predniSONE  10 mg Oral Daily  . sirolimus  1.2 mg Oral q morning - 10a   Continuous Infusions: . sodium chloride 50 mL/hr at 03/17/14 0100    Time spent on care of this patient: 25 min   Latese Dufault, DO  03/19/2014, 9:22 AM  LOS: 5 days   [name] Office  [phone] Pager - Text Page per [email or web address]  If 7PM-7AM, please contact night-coverage [email or web address]

## 2014-03-19 NOTE — Progress Notes (Signed)
Physical Therapy Treatment Patient Details Name: Chad [name] MRN: [id number] DOB: [birth year] Today's Date: 03/19/2014    History of Present Illness 59 y/o black male with complex medical hx including Hepatitis C and renal transplant presents for repair of L distal femur nonunion. 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 Pt sustained a ground level fall in 2014 and had numerous procedures on this. Some question as to the presence of infection. Pt remains on abx. Continues to have pain and difficulty with daily activities. Now s/p partial escision of L distal femur and placement of antibiotic beads; NWB    PT Comments    Pt declining OOB mobility due to pain & states "i'm just too weak!".  Educated pt on importance of OOB mobility with pt verbalizing understanding but continuing to defer.  Pt was agreeable to perform LE exercises & while performing this clnician noticed wet linens with pt stating "i spilt my urinal".  Asked if had notified Nsing with him stating no he hadn't.   Pt still deferring OOB>chair to allow linen change therefore NT assisted changing & pericare with pt in bed.     Follow Up Recommendations  SNF     Equipment Recommendations  None recommended by PT    Recommendations for Other Services       Precautions / Restrictions Precautions Precautions: Fall Restrictions Weight Bearing Restrictions: Yes LLE Weight Bearing: Non weight bearing    Mobility  Bed Mobility Overal bed mobility: Needs Assistance Bed Mobility: Rolling Rolling: Mod assist         General bed mobility comments: (A) to roll & manage LE's due to pain.    Transfers                    Ambulation/Gait                 Stairs            Wheelchair Mobility    Modified Rankin (Stroke Patients Only)       Balance                                    Cognition Arousal/Alertness: Awake/alert Behavior During Therapy: WFL for tasks  assessed/performed Overall Cognitive Status: Within Functional Limits for tasks assessed                      Exercises General Exercises - Lower Extremity Ankle Circles/Pumps: AROM;Both;10 reps Quad Sets: AROM;Both;10 reps Gluteal Sets: AROM;Both;10 reps    General Comments        Pertinent Vitals/Pain Pain Assessment: 0-10 Pain Score: 9  Pain Location: bil knees Lt>Rt Pain Descriptors / Indicators: Aching;Constant Pain Intervention(s): Limited activity within patient's tolerance;Repositioned    Home Living                      Prior Function            PT Goals (current goals can now be found in the care plan section) Acute Rehab PT Goals PT Goal Formulation: With patient Time For Goal Achievement: 03/29/14 Potential to Achieve Goals: Good Progress towards PT goals: Not progressing toward goals - comment    Frequency  Min 5X/week    PT Plan Current plan remains appropriate    Co-evaluation             End of Session  Activity Tolerance: Patient limited by pain Patient left: in bed;with call bell/phone within reach;with nursing/sitter in room     Time: 1610-9604 PT Time Calculation (min) (ACUTE ONLY): 21 min  Charges:  $Therapeutic Activity: 8-22 mins                    G Codes:      Lara Mulch 03/19/2014, 12:32 PM   Verdell Face, PTA [phone] 03/19/2014

## 2014-03-19 NOTE — Progress Notes (Signed)
Subjective:  Says is better- conversive- says is weak-   he is making more urine.  BUN and Creatinine down a little  Objective Vital signs in last 24 hours: Filed Vitals:   03/18/14 1250 03/18/14 1347 03/18/14 1939 03/19/14 0511  BP: 139/78  158/86 129/63  Pulse: 105  93 87  Temp: 99.3 F (37.4 C) 100.3 F (37.9 C) 99 F (37.2 C) 99.9 F (37.7 C)  TempSrc: Oral Rectal Oral Oral  Resp: Height:      Weight:      SpO2: 97%  97% 97%   Weight change:   Intake/Output Summary (Last 24 hours) at 03/19/14 0941 Last data filed at 03/19/14 1610  Gross per 24 hour  Intake   1010 ml  Output    900 ml  Net    110 ml    Assessment/Plan: Very complicated 59 year old black male with history of a kidney transplant but also advanced chronic kidney disease 1.Renal- patient has experienced acute on chronic kidney disease in the setting of being postop from an orthopedic procedure. Possible low BP causing decreased renal perfusion and that is the reason for his acute kidney injury. I have discontinued all antihypertensive medications, blood pressure is better but does not need them resumed at this time.  Urinalysis was clear. I am not suspicious of obstruction at this time. He has not had any disruption in his antirejection medications. He is on the dosing that he takes at home and these will be continued. I'm not suspicious of rejection at this time. Creatinine better,  pt seems slightly uremic to me but blames it on the pain meds and it likely will be a hard sell to convince him it is uremia.   There are no acute indications for dialysis at this time.  Creatinine trending down-  rapamune trough pending and have d/cd reglan in case it is causing some of the twitching.  From a kidney standpoint since creatinine trending down could be discharged but looks like he may need a little more PT prior to discharge  2. Hypertension/volume - postoperatively he was very hypotensive. Have discontinued his  Coreg, Norvasc and  hydralazine. Given that he has a little bit of lower extremity edema will keep on Lasix for now, but no zaroxolyn. Is better.  He is not really acting like an adrenal insufficiency at this time so will just continue the prednisone 10 mg daily which is his usual dose 3. Anemia - mostly due to surgery. Hemoglobin of month ago was 13. S/p transfusion 2/18 with appropriate bump 4. Infectious disease status post debridement and removal of infected hardware in his leg. He is currently on augmentin-  ID following 5. Bones- will continue his calcitriol for secondary hyperparathyroidism- phos is 8- will add renvela    Murle Otting A    Labs: Basic Metabolic Panel:  Recent Labs Lab 03/17/14 0651 03/18/14 0540 03/19/14 0625  NA 135 135 136  K 5.0 4.5 3.8  CL 98 95* 96  CO2 GLUCOSE 103* 102* 88  BUN 126* 125* 118*  CREATININE 6.22* 6.14* 5.95*  CALCIUM 7.9* 8.3* 8.4  PHOS  --   --  8.0*   Liver Function Tests:  Recent Labs Lab 03/15/14 0544 03/19/14 0625  AST 22  --   ALT 10  --   ALKPHOS 28*  --   BILITOT 0.7  --   PROT 5.6*  --   ALBUMIN 2.2* 2.1*  No results for input(s): LIPASE, AMYLASE in the last 168 hours.  Recent Labs Lab 03/18/14 1541  AMMONIA 23   CBC:  Recent Labs Lab 03/14/14 2119 03/15/14 0544 03/16/14 0723 03/17/14 0651 03/18/14 0540  WBC 18.3* 14.6* 15.6* 16.5* 18.1*  HGB 10.4* 8.2* 6.7* 8.4* 8.0*  HCT 32.5* 25.5* 20.7* 25.9* 24.2*  MCV 98.5 97.7 98.1 95.9 95.3  PLT 182 147* 120* 116* 130*   Cardiac Enzymes: No results for input(s): CKTOTAL, CKMB, CKMBINDEX, TROPONINI in the last 168 hours. CBG:  Recent Labs Lab 03/18/14 0534 03/18/14 1339  GLUCAP 98 121*    Iron Studies:  No results for input(s): IRON, TIBC, TRANSFERRIN, FERRITIN in the last 72 hours. Studies/Results: No results found. Medications: Infusions: . sodium chloride 50 mL/hr at 03/17/14 0100    Scheduled Medications: . allopurinol   100 mg Oral BID  . amiodarone  100 mg Oral Daily  . amoxicillin-clavulanate  1 tablet Oral Daily  . calcitRIOL  0.5 mcg Oral Daily  . Cholecalciferol  5,000 Units Oral Daily  . docusate sodium  100 mg Oral BID  . enoxaparin (LOVENOX) injection  30 mg Subcutaneous Q24H  . furosemide  40 mg Oral BID  . isosorbide mononitrate  60 mg Oral Daily  . pravastatin  40 mg Oral QHS  . predniSONE  10 mg Oral Daily  . sirolimus  1.2 mg Oral q morning - 10a    have reviewed scheduled and prn medications.  Physical Exam: General: NAD, obese, less twitchy, tremulous with no hiccups today Heart: RRR Lungs: clear Abdomen: soft, non tender Extremities: edema    03/19/2014,9:41 AM  LOS: 5 days

## 2014-03-19 NOTE — Progress Notes (Signed)
OT Cancellation Note  Patient Details Name: Chad [name] MRN: [id number] DOB: Oct 26, 1955   Cancelled Treatment:    Reason Eval/Treat Not Completed: Fatigue/lethargy limiting ability to participate;Pain limiting ability to participate.   Nena Jordan M   Carney Living, OTR/L Occupational Therapist [phone] (pager)  03/19/2014, 2:43 PM

## 2014-03-19 NOTE — Progress Notes (Signed)
SPORTS MEDICINE AND JOINT REPLACEMENT  Chad Spurling, MD   Chad Cabal, PA-C [address] [place], [place], [place]  [address]                             [phone]   PROGRESS NOTE  Subjective:  negative for Chest Pain  negative for Shortness of Breath  negative for Nausea/Vomiting   negative for Calf Pain  negative for Bowel Movement   Tolerating Diet: yes         Patient reports pain as 7 on 0-10 scale.    Objective: Vital signs in last 24 hours:   Patient Vitals for the past 24 hrs:  BP Temp Temp src Pulse Resp SpO2  03/19/14 0511 129/63 mmHg 99.9 F (37.7 C) Oral 87 18 97 %  03/18/14 1939 (!) 158/86 mmHg 99 F (37.2 C) Oral 93 18 97 %  03/18/14 1347 - 100.3 F (37.9 C) Rectal - - -  03/18/14 1250 139/78 mmHg 99.3 F (37.4 C) Oral (!) 105 18 97 %    @flow {1959:LAST@   Intake/Output from previous day:   02/20 0701 - 02/21 0700 In: 1010 [P.O.:1010] Out: 750 [Urine:750]   Intake/Output this shift:   02/21 0701 - 02/21 1900 In: 240 [P.O.:240] Out: 350 [Urine:350]   Intake/Output      02/20 0701 - 02/21 0700 02/21 0701 - 02/22 0700   P.O. 1010 240   Total Intake(mL/kg) 1010 (8.2) 240 (1.9)   Urine (mL/kg/hr) 750 (0.3) 350 (0.8)   Total Output 750 350   Net +260 -110           LABORATORY DATA:  Recent Labs  03/14/14 2119 03/15/14 0544 03/16/14 0723 03/17/14 0651 03/18/14 0540  WBC 18.3* 14.6* 15.6* 16.5* 18.1*  HGB 10.4* 8.2* 6.7* 8.4* 8.0*  HCT 32.5* 25.5* 20.7* 25.9* 24.2*  PLT 182 147* 120* 116* 130*    Recent Labs  03/14/14 0932 03/14/14 2119 03/15/14 0544 03/16/14 0723 03/17/14 0651 03/18/14 0540 03/19/14 0625  NA  --   --  136 134* 135 135 136  K  --   --  4.1 4.5 5.0 4.5 3.8  CL  --   --  96 96 98 95* 96  CO2  --   --  32 29 28 27 27   BUN  --   --  115* 125* 126* 125* 118*  CREATININE  --  4.94* 5.04* 5.75* 6.22* 6.14* 5.95*  GLUCOSE  --   --  158* 122* 103* 102* 88  CALCIUM 9.4  --  8.2* 7.6* 7.9* 8.3* 8.4   Lab Results   Component Value Date   INR 1.05 03/06/2014   INR 1.16 06/15/2011   INR 1.27 06/08/2011    Examination:  General appearance: alert, cooperative and no distress Extremities: Homans sign is negative, no sign of DVT  Wound Exam: clean, dry, intact   Drainage:  None: wound tissue dry  Motor Exam: EHL and FHL Intact  Sensory Exam: Deep Peroneal normal   Assessment:    5 Days Post-Op  Procedure(s) (LRB): HARDWARE REMOVAL LEFT (Left) DEBRIDEMENT WOUND (Left)  ADDITIONAL DIAGNOSIS:  Active Problems:   Cardiomyopathy   Atrial flutter   CKD (chronic kidney disease)   Hypertension   History of renal transplant   Osteomyelitis   Broken femur  Plan: Physical Therapy as ordered NWB          Chad [name] 03/19/2014, 10:34 AM

## 2014-03-20 ENCOUNTER — Inpatient Hospital Stay (HOSPITAL_COMMUNITY): Payer: Medicare HMO

## 2014-03-20 LAB — RENAL FUNCTION PANEL
Albumin: 1.9 g/dL — ABNORMAL LOW (ref 3.5–5.2)
Anion gap: 12 (ref 5–15)
BUN: 115 mg/dL — ABNORMAL HIGH (ref 6–23)
CHLORIDE: 94 mmol/L — AB (ref 96–112)
CO2: 26 mmol/L (ref 19–32)
CREATININE: 5.6 mg/dL — AB (ref 0.50–1.35)
Calcium: 8 mg/dL — ABNORMAL LOW (ref 8.4–10.5)
GFR calc Af Amer: 12 mL/min — ABNORMAL LOW (ref >90)
GFR calc non Af Amer: 10 mL/min — ABNORMAL LOW (ref >90)
Glucose, Bld: 91 mg/dL (ref 70–99)
Phosphorus: 8.1 mg/dL — ABNORMAL HIGH (ref 2.3–4.6)
Potassium: 3.6 mmol/L (ref 3.5–5.1)
SODIUM: 132 mmol/L — AB (ref 135–145)

## 2014-03-20 LAB — CBC
HCT: 19.6 % — ABNORMAL LOW (ref 39.0–52.0)
HEMOGLOBIN: 6.5 g/dL — AB (ref 13.0–17.0)
MCH: 31.7 pg (ref 26.0–34.0)
MCHC: 33.2 g/dL (ref 30.0–36.0)
MCV: 95.6 fL (ref 78.0–100.0)
Platelets: 131 10*3/uL — ABNORMAL LOW (ref 150–400)
RBC: 2.05 MIL/uL — ABNORMAL LOW (ref 4.22–5.81)
RDW: 17.3 % — ABNORMAL HIGH (ref 11.5–15.5)
WBC: 19.4 10*3/uL — ABNORMAL HIGH (ref 4.0–10.5)

## 2014-03-20 LAB — VITAMIN D 1,25 DIHYDROXY
VITAMIN D 1, 25 (OH) TOTAL: 53 pg/mL
Vitamin D2 1, 25 (OH)2: <10 pg/mL
Vitamin D3 1, 25 (OH)2: 52 pg/mL

## 2014-03-20 LAB — PREPARE RBC (CROSSMATCH)

## 2014-03-20 MED ORDER — ENOXAPARIN SODIUM 40 MG/0.4ML ~~LOC~~ SOLN
40.0000 mg | SUBCUTANEOUS | Status: DC
  Administered 2014-03-21 – 2014-03-22 (×2): 40 mg via SUBCUTANEOUS
  Filled 2014-03-20 (×2): qty 0.4

## 2014-03-20 MED ORDER — SODIUM CHLORIDE 0.9 % IV SOLN: Freq: Once | INTRAVENOUS | Status: DC

## 2014-03-20 NOTE — Progress Notes (Signed)
UR complete.  Annison Birchard RN, MSN 

## 2014-03-20 NOTE — Progress Notes (Signed)
Patient ID: Chad [name], male   DOB: [birth year], 59 y.o.   MRN: [id number]         [facility]    Date of Admission:  03/14/2014   Prolonged antibiotics          Active Problems:   Cardiomyopathy   Atrial flutter   CKD (chronic kidney disease)   Hypertension   History of renal transplant   Osteomyelitis   Broken femur   . sodium chloride   Intravenous Once  . sodium chloride   Intravenous Once  . allopurinol  100 mg Oral BID  . amiodarone  100 mg Oral Daily  . amoxicillin-clavulanate  1 tablet Oral Daily  . calcitRIOL  0.5 mcg Oral Daily  . Cholecalciferol  5,000 Units Oral Daily  . docusate sodium  100 mg Oral BID  . [START ON 03/21/2014] enoxaparin (LOVENOX) injection  40 mg Subcutaneous Q24H  . furosemide  40 mg Oral BID  . isosorbide mononitrate  60 mg Oral Daily  . pravastatin  40 mg Oral QHS  . predniSONE  10 mg Oral Daily  . sevelamer carbonate  1,600 mg Oral TID WC  . sirolimus  1.2 mg Oral q morning - 10a    Subjective: He remains very weak but denies pain.  Review of Systems: Pertinent items are noted in HPI.  Past Medical History  Diagnosis Date  . CKD (chronic kidney disease), stage IV     a. s/p renal transplant 1996.  Marland Kitchen HLD (hyperlipidemia)     takes Pravastatin daily  . Nonischemic cardiomyopathy     a. unknown etiology, EF 30-35% by echo 09/2011;  b. 09/2011 Lexi MV EF 42%, no ischemia/infarct.  . Systolic CHF     takes Lasix daily  . Atrial flutter     takes Amiodarone daily  . Gout     takes Allopurinol daily  . Hypertension     takes Amlodipine,Hydralazine,Imdur,and Coreg daily  . GERD (gastroesophageal reflux disease)     takes Protonix daily  . Shortness of breath dyspnea     exertion  . Pneumonia 2014  . History of bronchitis     several yrs ago  . Joint pain     right shoulder and left knee  . Sciatica   . Hepatitis C 1996    Hep C  . History of colon polyps   . History of kidney stones   .  Urinary frequency   . History of shingles     History  Substance Use Topics  . Smoking status: Never Smoker   . Smokeless tobacco: Never Used  . Alcohol Use: No    Family History  Problem Relation Age of Onset  . Heart disease Neg Hx    No Known Allergies  OBJECTIVE: Blood pressure 152/66, pulse 88, temperature 99.4 F (37.4 C), temperature source Oral, resp. rate 18, height 6' (1.829 m), weight 272 lb (123.378 kg), SpO2 96 %. General: He is comfortable and in no distress sitting up in bed.  Lab Results Lab Results  Component Value Date   WBC 19.4* 03/20/2014   HGB 6.5* 03/20/2014   HCT 19.6* 03/20/2014   MCV 95.6 03/20/2014   PLT 131* 03/20/2014    Lab Results  Component Value Date   CREATININE 5.60* 03/20/2014   BUN 115* 03/20/2014   NA 132* 03/20/2014   K 3.6 03/20/2014   CL 94* 03/20/2014   CO2 26 03/20/2014    Lab Results  Component Value Date   ALT 10 03/15/2014   AST 22 03/15/2014   ALKPHOS 28* 03/15/2014   BILITOT 0.7 03/15/2014     Microbiology: Recent Results (from the past 240 hour(s))  Anaerobic culture     Status: None   Collection Time: 03/14/14 12:40 PM  Result Value Ref Range Status   Specimen Description WOUND LEFT THIGH  Final   Special Requests NONE  Final   Gram Stain   Final    RARE WBC PRESENT, PREDOMINANTLY PMN NO SQUAMOUS EPITHELIAL CELLS SEEN NO ORGANISMS SEEN Performed at [facility]    Culture   Final    NO ANAEROBES ISOLATED Performed at [facility]    Report Status 03/19/2014 FINAL  Final  Gram stain     Status: None   Collection Time: 03/14/14 12:40 PM  Result Value Ref Range Status   Specimen Description WOUND LEFT THIGH  Final   Special Requests NONE  Final   Gram Stain   Final    FEW WBC PRESENT, PREDOMINANTLY PMN NO ORGANISMS SEEN Gram Stain Report Called to,Read Back By and Verified With: D.JOHNSON,RN 03/14/14 1320 BY BSLADE    Report Status 03/14/2014 FINAL  Final  Wound culture      Status: None   Collection Time: 03/14/14 12:40 PM  Result Value Ref Range Status   Specimen Description WOUND LEFT THIGH  Final   Special Requests NONE  Final   Gram Stain   Final    FEW WBC PRESENT, PREDOMINANTLY PMN NO SQUAMOUS EPITHELIAL CELLS SEEN NO ORGANISMS SEEN Performed at [facility] Performed at [facility]    Culture   Final    NO GROWTH 2 DAYS Note: Gram Stain Report Called to,Read Back By and Verified With: D Laural Benes RN 03/14/14 1320 BY BSLADE Performed at [facility]    Report Status 03/16/2014 FINAL  Final  Gram stain     Status: None   Collection Time: 03/14/14  1:29 PM  Result Value Ref Range Status   Specimen Description TISSUE LEG LEFT  Final   Special Requests SPEC (C) PT ON VANCOMYCIN  Final   Gram Stain NO WBC SEEN NO ORGANISMS SEEN   Final   Report Status 03/14/2014 FINAL  Final  Anaerobic culture     Status: None   Collection Time: 03/14/14  1:29 PM  Result Value Ref Range Status   Specimen Description TISSUE LEG LEFT  Final   Special Requests NON UNION LEFT FEMUR PT ON VANCOMYCIN  Final   Gram Stain   Final    NO WBC SEEN NO ORGANISMS SEEN Performed at [facility] Performed at [facility]    Culture   Final    NO ANAEROBES ISOLATED Performed at [facility]    Report Status 03/19/2014 FINAL  Final  Tissue culture     Status: None   Collection Time: 03/14/14  1:29 PM  Result Value Ref Range Status   Specimen Description TISSUE LEG LEFT  Final   Special Requests NON UNION LEFT FEMUR PT ON VANCOMYCIN  Final   Gram Stain   Final    NO WBC SEEN NO ORGANISMS SEEN Performed at [facility] Performed at [facility]    Culture   Final    NO GROWTH 3 DAYS Performed at [facility]    Report Status 03/18/2014 FINAL  Final  Anaerobic culture     Status: None   Collection Time:  03/14/14  1:37 PM  Result Value Ref Range Status   Specimen Description TISSUE LEG LEFT   Final   Special Requests NON UNION LEFT FEMUR SPEC (B) PT ON VANCOMYCIN  Final   Gram Stain   Final    NO WBC SEEN NO ORGANISMS SEEN Performed at [facility] Performed at [facility]    Culture   Final    NO ANAEROBES ISOLATED Performed at [facility]    Report Status 03/19/2014 FINAL  Final  Anaerobic culture     Status: None   Collection Time: 03/14/14  1:37 PM  Result Value Ref Range Status   Specimen Description TISSUE LEG LEFT  Final   Special Requests NON UNION LEFT FEMUR SPEC (D) PT ON VANCOMYCINA  Final   Gram Stain   Final    FEW WBC PRESENT, PREDOMINANTLY MONONUCLEAR NO ORGANISMS SEEN Performed at [facility] Performed at [facility]    Culture   Final    NO ANAEROBES ISOLATED Performed at [facility]    Report Status 03/19/2014 FINAL  Final  Gram stain     Status: None   Collection Time: 03/14/14  1:37 PM  Result Value Ref Range Status   Specimen Description TISSUE LEG LEFT  Final   Special Requests NON UNION LEFT FEMUR SPEC (B) PT ON VANCOMYCIN  Final   Gram Stain NO WBC SEEN NO ORGANISMS SEEN   Final   Report Status 03/14/2014 FINAL  Final  Gram stain     Status: None   Collection Time: 03/14/14  1:37 PM  Result Value Ref Range Status   Specimen Description TISSUE LEG LEFT  Final   Special Requests NON UNION LEFT FEMUR SPEC (D) PT ON VANCOMYCIN  Final   Gram Stain   Final    FEW WBC PRESENT, PREDOMINANTLY MONONUCLEAR NO ORGANISMS SEEN    Report Status 03/15/2014 FINAL  Final  Tissue culture     Status: None   Collection Time: 03/14/14  1:37 PM  Result Value Ref Range Status   Specimen Description TISSUE LEG LEFT  Final   Special Requests NON UNION LEFT FEMUR SPEC (B) PT ON VANCOMYCIN  Final   Gram Stain   Final    NO WBC SEEN NO ORGANISMS SEEN Performed at [facility] Performed at [facility]    Culture   Final    NO GROWTH 3 DAYS Performed at [facility]     Report Status 03/18/2014 FINAL  Final  Tissue culture     Status: None   Collection Time: 03/14/14  1:37 PM  Result Value Ref Range Status   Specimen Description TISSUE LEG LEFT  Final   Special Requests NON UNION LEFT FEMUR SPEC (D) PT ON VANCOMYCIN  Final   Gram Stain   Final    FEW WBC PRESENT, PREDOMINANTLY MONONUCLEAR NO ORGANISMS SEEN Performed at [facility] Performed at [facility]    Culture   Final    NO GROWTH 3 DAYS Performed at [facility]    Report Status 03/18/2014 FINAL  Final  Culture, blood (routine x 2)     Status: None (Preliminary result)   Collection Time: 03/18/14  3:34 PM  Result Value Ref Range Status   Specimen Description BLOOD RIGHT HAND  Final   Special Requests [facility]  Final   Culture   Final           BLOOD CULTURE  RECEIVED NO GROWTH TO DATE CULTURE WILL BE HELD FOR 5 DAYS BEFORE ISSUING A FINAL NEGATIVE REPORT Performed at [facility]    Report Status PENDING  Incomplete    Assessment: His operative Gram stain did not show any organisms and all tissue cultures are negative. I will continue amoxicillin clavulanate and arrange follow-up in my clinic within the next 3-4 weeks.  Plan: 1. Continue amoxicillin clavulanate 2. I will arrange follow-up in my clinic and sign off now  Cliffton Asters, MD [facility] [phone] pager   [phone] cell 03/20/2014, 12:56 PM

## 2014-03-20 NOTE — Progress Notes (Signed)
Physical Therapy Treatment Patient Details Name: Chad [name] MRN: [id number] DOB: 1955-04-30 Today's Date: 03/20/2014    History of Present Illness 59 y/o black male with complex medical hx including Hepatitis C and renal transplant presents for repair of L distal femur nonunion. 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 Pt sustained a ground level fall in 2014 and had numerous procedures on this. Some question as to the presence of infection. Pt remains on abx. Continues to have pain and difficulty with daily activities. Now s/p partial escision of L distal femur and placement of antibiotic beads; NWB    PT Comments    Pt with depressed moral due to chronic illness and current set back. Pt did improve ability to stand and standing tolerance however con't to require assist of 2 people for safe OOB mobility. Pt tolerated minimal L Knee ROM. Con't to follow acutely.   Follow Up Recommendations  SNF     Equipment Recommendations  None recommended by PT    Recommendations for Other Services       Precautions / Restrictions Precautions Precautions: Fall Restrictions Weight Bearing Restrictions: Yes LLE Weight Bearing: Non weight bearing    Mobility  Bed Mobility Overal bed mobility: Needs Assistance Bed Mobility: Supine to Sit;Sit to Supine     Supine to sit: Min assist;Total assist Sit to supine: Mod assist;+2 for physical assistance   General bed mobility comments: assist for L LE mangement and trunk elevation and then bilat LE management back into bed and assist to scoot hips back into bed  Transfers Overall transfer level: Needs assistance Equipment used: Rolling walker (2 wheeled) Transfers: Sit to/from Stand Sit to Stand: Mod assist;+2 physical assistance         General transfer comment: assist for initial transfer to power up, assist to maintain L LE NWB. pt stood x 2 for 1.5 min each with minA to maintain balance however pt did put some weight through L LE as pt  unable to hold L LE off ground  Ambulation/Gait                 Stairs            Wheelchair Mobility    Modified Rankin (Stroke Patients Only)       Balance Overall balance assessment: Needs assistance   Sitting balance-Leahy Scale: Good     Standing balance support: Bilateral upper extremity supported Standing balance-Leahy Scale: Poor                      Cognition Arousal/Alertness: Awake/alert Behavior During Therapy: WFL for tasks assessed/performed Overall Cognitive Status: Within Functional Limits for tasks assessed                      Exercises General Exercises - Lower Extremity Ankle Circles/Pumps: AROM;Both;10 reps Quad Sets: AROM;Both;10 reps Long Arc Quad: AAROM;Left;10 reps;Seated (for R knee ROM)    General Comments        Pertinent Vitals/Pain Pain Assessment: 0-10 Pain Score: 9  Pain Location: bilat LE R >L from fall Pain Descriptors / Indicators: Aching;Constant Pain Intervention(s): Monitored during session    Home Living                      Prior Function            PT Goals (current goals can now be found in the care plan section) Progress towards PT goals:  Progressing toward goals    Frequency  Min 5X/week    PT Plan Current plan remains appropriate    Co-evaluation             End of Session Equipment Utilized During Treatment: Gait belt Activity Tolerance: Patient limited by pain Patient left: in bed;with call bell/phone within reach;with nursing/sitter in room     Time: 1158-1230 PT Time Calculation (min) (ACUTE ONLY): 32 min  Charges:  $Therapeutic Exercise: 8-22 mins $Therapeutic Activity: 8-22 mins                    G Codes:      Marcene Brawn 03/20/2014, 1:04 PM   Lewis Shock, PT, DPT Pager #: [phone] Office #: [phone]

## 2014-03-20 NOTE — Progress Notes (Signed)
OT Cancellation Note  Patient Details Name: Chad [name] MRN: [id number] DOB: Mar 15, 1955   Cancelled Treatment:    Reason Eval/Treat Not Completed: Patient at procedure or test/ unavailable, pt. Receiving blood.  Will check back as able.  Robet Leu, COTA/L 03/20/2014, 10:39 AM

## 2014-03-20 NOTE — Progress Notes (Signed)
[name] Consult Note   Chad [name] SWH:[id number] DOB: 11-14-1955 DOA: 03/14/2014 PCP: [name], MD  Brief narrative: Chad [name] is a 59 y.o. male with CK D stage V status post renal transplant on immunosuppressive therapy, nonischemic cardiomyopathy with an EF of 30-35%, a flutter, hepatitis C as a result of the renal transplant, hypertension admitted to the orthopedic service and underwent partial excision of the left femur for chronic osteomyelitis, removal of hardware left femur and insertion of antibiotic beads. The hospitalist group has been asked to manage medical problems.   Subjective: C/o pain and fatigue -agreeable to sNF  Assessment/Plan: Status post hardware removal and antibiotic bead with placement left leg ID following- abx per them - still with low grade fevers  leukocytosis -? Steroid or brewing infection- defer to ID  Acute resp failure -get chest xray -? Fluid overload  Acute on Chronic kidney disease stage IV with h/o renal transplant -baseline about 4 -Last creatinine checked in August 2015 was 3.95 -nephrology consult  Anemia- etiology uncertain but may be related to acute blood loss -transfuse  Hx of renal transplant continue rapamune and prednisone  Nonischemic cardiomyopathy hold Lasix and Zaroxolyn. Continue aspirin and statin. Monitor on telemetry.  Hyperlipidemia Continue statin  A flutter Rate control. Not on anticoagulation. Continue amiodarone.  Hypertension Continue amlodipine and Coreg  Gout Continue allopurinol  GERD Continue PPI    Code Status: Full code Family Communication:  Disposition Plan: SNF DVT prophylaxis: Per orthopedics  Antibiotics: Anti-infectives    Start     Dose/Rate Route Frequency Ordered Stop   03/17/14 1300  amoxicillin-clavulanate (AUGMENTIN) 500-125 MG per tablet 500 mg     1 tablet Oral Daily 03/17/14 1146     03/14/14 2200  vancomycin (VANCOCIN) 1,500 mg in sodium  chloride 0.9 % 500 mL IVPB     1,500 mg 250 mL/hr over 120 Minutes Intravenous  Once 03/14/14 2021 03/15/14 0110   03/14/14 2100  piperacillin-tazobactam (ZOSYN) IVPB 2.25 g  Status:  Discontinued     2.25 g 100 mL/hr over 30 Minutes Intravenous 4 times per day 03/14/14 2021 03/17/14 1146   03/14/14 1421  vancomycin (VANCOCIN) powder  Status:  Discontinued       As needed 03/14/14 1421 03/14/14 1521   03/14/14 1414  tobramycin (NEBCIN) powder  Status:  Discontinued       As needed 03/14/14 1415 03/14/14 1521   03/14/14 1330  vancomycin (VANCOCIN) IVPB 1000 mg/200 mL premix     1,000 mg 200 mL/hr over 60 Minutes Intravenous To Surgery 03/14/14 1323 03/14/14 1328         Objective: Filed Weights   03/14/14 0931  Weight: 123.378 kg (272 lb)    Intake/Output Summary (Last 24 hours) at 03/20/14 1100 Last data filed at 03/20/14 1020  Gross per 24 hour  Intake    750 ml  Output   1100 ml  Net   -350 ml     Vitals Filed Vitals:   03/19/14 2012 03/20/14 0541 03/20/14 1015 03/20/14 1035  BP: 126/59 121/64 137/68 152/66  Pulse: 96 86 90 88  Temp: 99.1 F (37.3 C) 98.8 F (37.1 C) 99.3 F (37.4 C) 99.4 F (37.4 C)  TempSrc: Oral Oral Oral Oral  Resp: 18 18 20 18   Height:      Weight:      SpO2: 96% 96% 98% 96%    Exam: General: Awake  Lungs: ? Crackles at bases Cardiovascular: Regular rate and  rhythm without murmur gallop or rub normal S1 and S2 Abdomen: Nontender, nondistended, soft, bowel sounds positive, no rebound, no ascites, no appreciable mass Extremities: +edema and what appears to be chronic scarring  Data Reviewed: Basic Metabolic Panel:  Recent Labs Lab 03/16/14 0723 03/17/14 0651 03/18/14 0540 03/19/14 0625 03/20/14 0451  NA 134* 135 135 136 132*  K 4.5 5.0 4.5 3.8 3.6  CL 96 98 95* 96 94*  CO2 GLUCOSE 122* 103* 102* 88 91  BUN 125* 126* 125* 118* 115*  CREATININE 5.75* 6.22* 6.14* 5.95* 5.60*  CALCIUM 7.6* 7.9* 8.3* 8.4 8.0*   PHOS  --   --   --  8.0* 8.1*   Liver Function Tests:  Recent Labs Lab 03/15/14 0544 03/19/14 0625 03/20/14 0451  AST 22  --   --   ALT 10  --   --   ALKPHOS 28*  --   --   BILITOT 0.7  --   --   PROT 5.6*  --   --   ALBUMIN 2.2* 2.1* 1.9*   No results for input(s): LIPASE, AMYLASE in the last 168 hours.  Recent Labs Lab 03/18/14 1541  AMMONIA 23   CBC:  Recent Labs Lab 03/15/14 0544 03/16/14 0723 03/17/14 0651 03/18/14 0540 03/20/14 0451  WBC 14.6* 15.6* 16.5* 18.1* 19.4*  HGB 8.2* 6.7* 8.4* 8.0* 6.5*  HCT 25.5* 20.7* 25.9* 24.2* 19.6*  MCV 97.7 98.1 95.9 95.3 95.6  PLT 147* 120* 116* 130* 131*   Cardiac Enzymes: No results for input(s): CKTOTAL, CKMB, CKMBINDEX, TROPONINI in the last 168 hours. BNP (last 3 results) No results for input(s): BNP in the last 8760 hours.  ProBNP (last 3 results) No results for input(s): PROBNP in the last 8760 hours.  CBG:  Recent Labs Lab 03/18/14 0534 03/18/14 1339  GLUCAP 98 121*    Recent Results (from the past 240 hour(s))  Anaerobic culture     Status: None   Collection Time: 03/14/14 12:40 PM  Result Value Ref Range Status   Specimen Description WOUND LEFT THIGH  Final   Special Requests NONE  Final   Gram Stain   Final    RARE WBC PRESENT, PREDOMINANTLY PMN NO SQUAMOUS EPITHELIAL CELLS SEEN NO ORGANISMS SEEN Performed at [facility]    Culture   Final    NO ANAEROBES ISOLATED Performed at [facility]    Report Status 03/19/2014 FINAL  Final  Gram stain     Status: None   Collection Time: 03/14/14 12:40 PM  Result Value Ref Range Status   Specimen Description WOUND LEFT THIGH  Final   Special Requests NONE  Final   Gram Stain   Final    FEW WBC PRESENT, PREDOMINANTLY PMN NO ORGANISMS SEEN Gram Stain Report Called to,Read Back By and Verified With: D.JOHNSON,RN 03/14/14 1320 BY BSLADE    Report Status 03/14/2014 FINAL  Final  Wound culture     Status: None   Collection Time:  03/14/14 12:40 PM  Result Value Ref Range Status   Specimen Description WOUND LEFT THIGH  Final   Special Requests NONE  Final   Gram Stain   Final    FEW WBC PRESENT, PREDOMINANTLY PMN NO SQUAMOUS EPITHELIAL CELLS SEEN NO ORGANISMS SEEN Performed at [facility] Performed at [facility]    Culture   Final    NO GROWTH 2 DAYS Note: Gram Stain Report Called to,Read Back By and  Verified With: Timoteo Expose RN 03/14/14 1320 BY BSLADE Performed at [facility]    Report Status 03/16/2014 FINAL  Final  Gram stain     Status: None   Collection Time: 03/14/14  1:29 PM  Result Value Ref Range Status   Specimen Description TISSUE LEG LEFT  Final   Special Requests SPEC (C) PT ON VANCOMYCIN  Final   Gram Stain NO WBC SEEN NO ORGANISMS SEEN   Final   Report Status 03/14/2014 FINAL  Final  Anaerobic culture     Status: None   Collection Time: 03/14/14  1:29 PM  Result Value Ref Range Status   Specimen Description TISSUE LEG LEFT  Final   Special Requests NON UNION LEFT FEMUR PT ON VANCOMYCIN  Final   Gram Stain   Final    NO WBC SEEN NO ORGANISMS SEEN Performed at [facility] Performed at [facility]    Culture   Final    NO ANAEROBES ISOLATED Performed at [facility]    Report Status 03/19/2014 FINAL  Final  Tissue culture     Status: None   Collection Time: 03/14/14  1:29 PM  Result Value Ref Range Status   Specimen Description TISSUE LEG LEFT  Final   Special Requests NON UNION LEFT FEMUR PT ON VANCOMYCIN  Final   Gram Stain   Final    NO WBC SEEN NO ORGANISMS SEEN Performed at [facility] Performed at [facility]    Culture   Final    NO GROWTH 3 DAYS Performed at [facility]    Report Status 03/18/2014 FINAL  Final  Anaerobic culture     Status: None   Collection Time: 03/14/14  1:37 PM  Result Value Ref Range Status   Specimen Description TISSUE LEG LEFT  Final   Special Requests NON UNION  LEFT FEMUR SPEC (B) PT ON VANCOMYCIN  Final   Gram Stain   Final    NO WBC SEEN NO ORGANISMS SEEN Performed at [facility] Performed at [facility]    Culture   Final    NO ANAEROBES ISOLATED Performed at [facility]    Report Status 03/19/2014 FINAL  Final  Anaerobic culture     Status: None   Collection Time: 03/14/14  1:37 PM  Result Value Ref Range Status   Specimen Description TISSUE LEG LEFT  Final   Special Requests NON UNION LEFT FEMUR SPEC (D) PT ON VANCOMYCINA  Final   Gram Stain   Final    FEW WBC PRESENT, PREDOMINANTLY MONONUCLEAR NO ORGANISMS SEEN Performed at [facility] Performed at [facility]    Culture   Final    NO ANAEROBES ISOLATED Performed at [facility]    Report Status 03/19/2014 FINAL  Final  Gram stain     Status: None   Collection Time: 03/14/14  1:37 PM  Result Value Ref Range Status   Specimen Description TISSUE LEG LEFT  Final   Special Requests NON UNION LEFT FEMUR SPEC (B) PT ON VANCOMYCIN  Final   Gram Stain NO WBC SEEN NO ORGANISMS SEEN   Final   Report Status 03/14/2014 FINAL  Final  Gram stain     Status: None   Collection Time: 03/14/14  1:37 PM  Result Value Ref Range Status   Specimen Description TISSUE LEG LEFT  Final   Special Requests NON UNION LEFT FEMUR SPEC (D) PT ON VANCOMYCIN  Final   Gram Stain   Final    FEW WBC PRESENT, PREDOMINANTLY MONONUCLEAR NO ORGANISMS SEEN    Report Status 03/15/2014 FINAL  Final  Tissue culture     Status: None   Collection Time: 03/14/14  1:37 PM  Result Value Ref Range Status   Specimen Description TISSUE LEG LEFT  Final   Special Requests NON UNION LEFT FEMUR SPEC (B) PT ON VANCOMYCIN  Final   Gram Stain   Final    NO WBC SEEN NO ORGANISMS SEEN Performed at [facility] Performed at [facility]    Culture   Final    NO GROWTH 3 DAYS Performed at [facility]    Report Status 03/18/2014 FINAL  Final   Tissue culture     Status: None   Collection Time: 03/14/14  1:37 PM  Result Value Ref Range Status   Specimen Description TISSUE LEG LEFT  Final   Special Requests NON UNION LEFT FEMUR SPEC (D) PT ON VANCOMYCIN  Final   Gram Stain   Final    FEW WBC PRESENT, PREDOMINANTLY MONONUCLEAR NO ORGANISMS SEEN Performed at [facility] Performed at [facility]    Culture   Final    NO GROWTH 3 DAYS Performed at [facility]    Report Status 03/18/2014 FINAL  Final  Culture, blood (routine x 2)     Status: None (Preliminary result)   Collection Time: 03/18/14  3:34 PM  Result Value Ref Range Status   Specimen Description BLOOD RIGHT HAND  Final   Special Requests AEB 2CC  Final   Culture   Final           BLOOD CULTURE RECEIVED NO GROWTH TO DATE CULTURE WILL BE HELD FOR 5 DAYS BEFORE ISSUING A FINAL NEGATIVE REPORT Performed at [facility]    Report Status PENDING  Incomplete       Scheduled Meds:  Scheduled Meds: . sodium chloride   Intravenous Once  . sodium chloride   Intravenous Once  . allopurinol  100 mg Oral BID  . amiodarone  100 mg Oral Daily  . amoxicillin-clavulanate  1 tablet Oral Daily  . calcitRIOL  0.5 mcg Oral Daily  . Cholecalciferol  5,000 Units Oral Daily  . docusate sodium  100 mg Oral BID  . [START ON 03/21/2014] enoxaparin (LOVENOX) injection  40 mg Subcutaneous Q24H  . furosemide  40 mg Oral BID  . isosorbide mononitrate  60 mg Oral Daily  . pravastatin  40 mg Oral QHS  . predniSONE  10 mg Oral Daily  . sevelamer carbonate  1,600 mg Oral TID WC  . sirolimus  1.2 mg Oral q morning - 10a   Continuous Infusions: . sodium chloride 50 mL/hr at 03/17/14 0100    Time spent on care of this patient: 25 min   Charita Lindenberger, DO  03/20/2014, 11:00 AM  LOS: 6 days   [name] Office  [phone] Pager - Text Page per [email or web address]  If 7PM-7AM, please contact night-coverage [email or web address]

## 2014-03-20 NOTE — Progress Notes (Signed)
Assessment/Plan: Very complicated 59 year old black male with history of a kidney transplant but also advanced chronic kidney disease 1.Renal- patient has experienced acute on chronic kidney disease in the setting of being postop from an orthopedic procedure. Possible low BP causing decreased renal perfusion and he has evidence of ABLA.  Creatinine trending down- rapamune trough pending  2. Hypertension/volume - postoperatively he was very hypotensive. Have discontinued his Coreg, Norvasc and hydralazine.  3. Anemia - Hemoglobin of month ago was 13. S/p transfusion 2/18 and tonight  For hgb 6.5 4. Infectious disease status post debridement and removal of infected hardware in his leg. He is currently on augmentin- ID following 5. Bones- will continue his calcitriol for secondary hyperparathyroidism- phos is 8- will add renvela  Subjective: Interval History: Hiccups  Objective: Vital signs in last 24 hours: Temp:  [98.8 F (37.1 C)-99.7 F (37.6 C)] 99.7 F (37.6 C) (02/22 1758) Pulse Rate:  [83-96] 83 (02/22 1758) Resp:  [18-20] 20 (02/22 1758) BP: (121-169)/(59-80) 127/80 mmHg (02/22 1758) SpO2:  [93 %-98 %] 93 % (02/22 1758) Weight change:   Intake/Output from previous day: 02/21 0701 - 02/22 0700 In: 960 [P.O.:960] Out: 1450 [Urine:1450] Intake/Output this shift: Total I/O In: 565 [P.O.:240; Blood:325] Out: 600 [Urine:600]  General appearance: alert and cooperative Head: Normocephalic, without obvious abnormality, atraumatic Resp: clear to auscultation bilaterally Cardio: systolic murmur: systolic ejection 2/6, crescendo and decrescendo at lower left sternal border GI: soft, non-tender; bowel sounds normal; no masses,  no organomegaly Extremities: edema 2+ Some myoclonus  Lab Results:  Recent Labs  03/18/14 0540 03/20/14 0451  WBC 18.1* 19.4*  HGB 8.0* 6.5*  HCT 24.2* 19.6*  PLT 130* 131*   BMET:  Recent Labs  03/19/14 0625 03/20/14 0451  NA 136 132*  K  3.8 3.6  CL 96 94*  CO2 27 26  GLUCOSE 88 91  BUN 118* 115*  CREATININE 5.95* 5.60*  CALCIUM 8.4 8.0*   No results for input(s): PTH in the last 72 hours. Iron Studies: No results for input(s): IRON, TIBC, TRANSFERRIN, FERRITIN in the last 72 hours. Studies/Results: Dg Chest Port 1 View  03/20/2014   CLINICAL DATA:  Short of breath.  EXAM: PORTABLE CHEST - 1 VIEW  COMPARISON:  03/06/2014  FINDINGS: Lung volumes are low. There is central vascular congestion, but no focal lung consolidation and no evidence of pulmonary edema. No pneumothorax or convincing pleural effusion.  Cardiac silhouette is mildly enlarged. No mediastinal or hilar masses.  IMPRESSION: Central vascular congestion without overt pulmonary edema. Study limited by the AP technique and low lung volumes. No evidence of pneumonia.   Electronically Signed   By: Amie Portland M.D.   On: 03/20/2014 12:07   Dg Knee Left Port  03/20/2014   CLINICAL DATA:  Left lower leg pain. Fall on Saturday. Prior distal left femoral fracture. Hardware removal on February 16.  EXAM: PORTABLE LEFT KNEE - 1-2 VIEW  COMPARISON:  03/14/2014  FINDINGS: Complex chronic impacted distal left femoral fracture is again noted. Radiopaque material noted within the distal femur, likely antibiotic beads. The appearance of the fracture is unchanged. No acute fracture. Moderate joint effusion noted.  IMPRESSION: Chronic impacted distal left femoral fracture with stable appearance. Radiopaque antibiotic beads noted within the distal femur.   Electronically Signed   By: Charlett Nose M.D.   On: 03/20/2014 09:53   Dg Tibia/fibula Left Port  03/20/2014   CLINICAL DATA:  Left lower leg pain. Fall on Saturday. History of  prior surgery and hardware removal on February 16. History of distal left femur fracture  EXAM: PORTABLE LEFT TIBIA AND FIBULA - 2 VIEW  COMPARISON:  Knee series performed today and 03/14/2014.  FINDINGS: Soft tissue swelling noted over the anterior mid to lower  left calf. Old healing distal left fibular shaft fracture. Callus formation noted. The fracture line appears to remain slightly evident on the lateral view. No acute fracture.  Chronic complex impacted distal left femoral fracture is again noted with radiopaque material within the distal femur, likely antibiotic beads.  IMPRESSION: Healing distal left fibular shaft fracture. Chronic complex impacted distal left femoral fracture. No acute fracture noted.   Electronically Signed   By: Charlett Nose M.D.   On: 03/20/2014 09:51   Scheduled: . sodium chloride   Intravenous Once  . sodium chloride   Intravenous Once  . allopurinol  100 mg Oral BID  . amiodarone  100 mg Oral Daily  . amoxicillin-clavulanate  1 tablet Oral Daily  . calcitRIOL  0.5 mcg Oral Daily  . Cholecalciferol  5,000 Units Oral Daily  . docusate sodium  100 mg Oral BID  . [START ON 03/21/2014] enoxaparin (LOVENOX) injection  40 mg Subcutaneous Q24H  . furosemide  40 mg Oral BID  . isosorbide mononitrate  60 mg Oral Daily  . pravastatin  40 mg Oral QHS  . predniSONE  10 mg Oral Daily  . sevelamer carbonate  1,600 mg Oral TID WC  . sirolimus  1.2 mg Oral q morning - 10a      LOS: 6 days   Ariadne Rissmiller C 03/20/2014,6:24 PM

## 2014-03-20 NOTE — Progress Notes (Signed)
[facility] Progress Note  Subjective  Reports he had a fall on Saturday Skin tear to Left lower leg anteriorly  Notes reviewed from weekend- deferred therapy over weekend   Appears pt is voiding better  Pt states he feels weak     Review of Systems  Constitutional: Positive for malaise/fatigue. Negative for fever and chills.  Respiratory: Negative for shortness of breath and wheezing.   Cardiovascular: Negative for chest pain and palpitations.  Gastrointestinal: Negative for nausea, vomiting and abdominal pain.  Neurological: Positive for weakness. Negative for headaches.     Objective   BP 121/64 mmHg  Pulse 86  Temp(Src) 98.8 F (37.1 C) (Oral)  Resp 18  Ht 6' (1.829 m)  Wt 123.378 kg (272 lb)  BMI 36.88 kg/m2  SpO2 96%  Intake/Output      02/21 0701 - 02/22 0700 02/22 0701 - 02/23 0700   P.O. 960    Total Intake(mL/kg) 960 (7.8)    Urine (mL/kg/hr) 1450 (0.5)    Total Output 1450     Net -490          Urine Occurrence 1 x      Labs Results for SULIMAN, TERMINI (MRN [id number]) as of 03/20/2014 08:57  Ref. Range 03/20/2014 04:51  Sodium Latest Range: 135-145 mmol/L 132 (L)  Potassium Latest Range: 3.5-5.1 mmol/L 3.6  Chloride Latest Range: 96-112 mmol/L 94 (L)  CO2 Latest Range: 19-32 mmol/L 26  BUN Latest Range: 6-23 mg/dL 409 (H)  Creatinine Latest Range: 0.50-1.35 mg/dL 8.11 (H)  Calcium Latest Range: 8.4-10.5 mg/dL 8.0 (L)  GFR calc non Af Amer Latest Range: >90 mL/min 10 (L)  GFR calc Af Amer Latest Range: >90 mL/min 12 (L)  Glucose Latest Range: 70-99 mg/dL 91  Anion gap Latest Range: 5-15  12  Phosphorus Latest Range: 2.3-4.6 mg/dL 8.1 (H)  Albumin Latest Range: 3.5-5.2 g/dL 1.9 (L)  WBC Latest Range: 4.0-10.5 K/uL 19.4 (H)  RBC Latest Range: 4.22-5.81 MIL/uL 2.05 (L)  Hemoglobin Latest Range: 13.0-17.0 g/dL 6.5 (LL)  HCT Latest Range: 39.0-52.0 % 19.6 (L)  MCV Latest Range: 78.0-100.0 fL 95.6  MCH Latest Range: 26.0-34.0 pg  31.7  MCHC Latest Range: 30.0-36.0 g/dL 91.4  RDW Latest Range: 11.5-15.5 % 17.3 (H)  Platelets Latest Range: 150-400 K/uL 131 (L)     Exam  Gen: resting comfortably in bed, NAD Lungs: clear anterior fields Cardiac: s1 and s2 Abd: + BS, NT Ext:       Left Lower Extremity   Dressings stable to L thigh  Changed dressing to eval wound to lower leg  V shaped wound proximal third of shin, superficial  Redressed with adaptic, 4x4s, abd and ace  Ext warm  Distal motor and sensory functions intact  + DP pulse    Assessment and Plan   POD/HD#: 53   59 y/o chronically ill black male with 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 Medical issues- CKD, Hep C, CHF,               Appreciate medicine and renal assistance with management of chronic medical issues             Home meds             continue to hold ASA                           Renal following for Acute on chronic renal disease    Cr and BUN trending back down                  Vitamin D levels are good and PTH levels look good for this particular pt   3. ABL anemia/anemia of chronic disease             transfuse 2 units PRBC's    Check cbc in am            4. DVT/PE prophylaxis             Lovenox   5. FEN              as tolerated   6. Pain               Continue with current pain regimen   7. Dispo             SW consult for SNF              Rx rec's for dc   Hopeful to get to SNF tomorrow if possible and pt stable     Mearl Latin, PA-C [facility] [phone]  [phone] (O) 03/20/2014 9:12 AM

## 2014-03-20 NOTE — Progress Notes (Signed)
CRITICAL VALUE ALERT  Critical value received:  Hemoglobin 6.5  Date of notification:  2/22  Time of notification:  0630  Critical value read back:Yes.    Nurse who received alert:  Cloyd Stagers  MD notified (1st page):  Dr Carola Frost  Time of first page:  [phone]  MD notified (2nd page):  Time of second page:  Responding MD: Dr Carola Frost  Time MD responded:  [phone]

## 2014-03-20 NOTE — Care Management Note (Signed)
    Page 1 of 1   03/22/2014     2:42:47 PM CARE MANAGEMENT NOTE 03/22/2014  Patient:  Chad [name], Chad [name]   Account Number:  [id number]  Date Initiated:  03/16/2014  Documentation initiated by:  [facility]  Subjective/Objective Assessment:   non union left femur, s/p hardware removal ORIF left hip  on home O2     Action/Plan:   PT/OT evals- recommended SNF  has worked with [facility] in the past   Anticipated DC Date:  03/18/2014   Anticipated DC Plan:  SKILLED NURSING FACILITY  In-house referral  Clinical Social Worker      DC Planning Services  CM consult      Choice offered to / List presented to:             Status of service:  Completed, signed off Medicare Important Message given?  YES (If response is "NO", the following Medicare IM given date fields will be blank) Date Medicare IM given:  03/17/2014 Medicare IM given by:  [facility] Date Additional Medicare IM given:  03/20/2014 Additional Medicare IM given by:  Elmer Bales  Discharge Disposition:  SKILLED NURSING FACILITY  Per UR Regulation:  Reviewed for med. necessity/level of care/duration of stay  If discussed at Long Length of Stay Meetings, dates discussed:    Comments:  03/20/14 1100 Elmer Bales RN, MSN, CM- Additional Medicare IM letter provided.   03/16/14 PT recommended SNF. Referral made to CSW. Patient is allowing CSW to proceed with SNF process but stated that he would prefer to go home. CM will continue to follow patient's progress in case d/c plan changes to [facility].

## 2014-03-21 LAB — RENAL FUNCTION PANEL
ALBUMIN: 2.1 g/dL — AB (ref 3.5–5.2)
Anion gap: 10 (ref 5–15)
BUN: 114 mg/dL — ABNORMAL HIGH (ref 6–23)
CO2: 29 mmol/L (ref 19–32)
Calcium: 8.4 mg/dL (ref 8.4–10.5)
Chloride: 97 mmol/L (ref 96–112)
Creatinine, Ser: 5.31 mg/dL — ABNORMAL HIGH (ref 0.50–1.35)
GFR calc non Af Amer: 11 mL/min — ABNORMAL LOW (ref >90)
GFR, EST AFRICAN AMERICAN: 13 mL/min — AB (ref >90)
Glucose, Bld: 102 mg/dL — ABNORMAL HIGH (ref 70–99)
POTASSIUM: 3.5 mmol/L (ref 3.5–5.1)
Phosphorus: 7.4 mg/dL — ABNORMAL HIGH (ref 2.3–4.6)
SODIUM: 136 mmol/L (ref 135–145)

## 2014-03-21 LAB — CBC
HCT: 23.3 % — ABNORMAL LOW (ref 39.0–52.0)
Hemoglobin: 7.7 g/dL — ABNORMAL LOW (ref 13.0–17.0)
MCH: 30.9 pg (ref 26.0–34.0)
MCHC: 33 g/dL (ref 30.0–36.0)
MCV: 93.6 fL (ref 78.0–100.0)
Platelets: 154 10*3/uL (ref 150–400)
RBC: 2.49 MIL/uL — AB (ref 4.22–5.81)
RDW: 17.9 % — ABNORMAL HIGH (ref 11.5–15.5)
WBC: 18 10*3/uL — ABNORMAL HIGH (ref 4.0–10.5)

## 2014-03-21 LAB — PREPARE RBC (CROSSMATCH)

## 2014-03-21 MED ORDER — SODIUM CHLORIDE 0.9 % IV SOLN: Freq: Once | INTRAVENOUS | Status: DC

## 2014-03-21 MED ORDER — FUROSEMIDE 10 MG/ML IJ SOLN
20.0000 mg | Freq: Once | INTRAMUSCULAR | Status: AC
  Administered 2014-03-21: 20 mg via INTRAVENOUS
  Filled 2014-03-21: qty 2

## 2014-03-21 MED ORDER — DIPHENHYDRAMINE HCL 25 MG PO CAPS
25.0000 mg | ORAL_CAPSULE | Freq: Once | ORAL | Status: AC
  Administered 2014-03-21: 25 mg via ORAL
  Filled 2014-03-21: qty 1

## 2014-03-21 MED ORDER — ACETAMINOPHEN 325 MG PO TABS
650.0000 mg | ORAL_TABLET | Freq: Once | ORAL | Status: AC
  Administered 2014-03-21: 650 mg via ORAL
  Filled 2014-03-21: qty 2

## 2014-03-21 NOTE — Progress Notes (Signed)
Physical Therapy Treatment Patient Details Name: Chad [name] MRN: [id number] DOB: 28-Oct-1955 Today's Date: 03/21/2014    History of Present Illness 59 y/o black male with complex medical hx including Hepatitis C and renal transplant presents for repair of L distal femur nonunion. 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 Pt sustained a ground level fall in 2014 and had numerous procedures on this. Some question as to the presence of infection. Pt remains on abx. Continues to have pain and difficulty with daily activities. Now s/p partial escision of L distal femur and placement of antibiotic beads; NWB    PT Comments    Pt continues to require 2 person (A) for transfers. Pt unable to maintain NWB status on Lt LE when mobilizing. Discussed D/C recommendation with pt, pt was hoping to D/C home tomorrow, recommend SNF for post acute rehab at this time.   Follow Up Recommendations  SNF     Equipment Recommendations  None recommended by PT    Recommendations for Other Services       Precautions / Restrictions Precautions Precautions: Fall Restrictions Weight Bearing Restrictions: Yes LLE Weight Bearing: Non weight bearing    Mobility  Bed Mobility Overal bed mobility: Needs Assistance Bed Mobility: Supine to Sit     Supine to sit: +2 for physical assistance;Mod assist;HOB elevated     General bed mobility comments: use of draw pad to bring LEs off EOB and control Lt LE ; 2nd person (A) with elevating trunk to sitting position   Transfers Overall transfer level: Needs assistance Equipment used: Rolling walker (2 wheeled) Transfers: Sit to/from [employer] Sit to Stand: +2 physical assistance;+2 safety/equipment;From elevated surface;Mod assist Stand pivot transfers: Total assist;+2 physical assistance;+2 safety/equipment;From elevated surface       General transfer comment: pt sat EOB ~5 min; required use of draw sheet and 2 person (A) to elevate to  standing; pt with difficult time maintaining NWB status on Lt LE; required max (A) of 2 person to achieve SPT; pt fatigued quickly and c/o rt LE buckling; stood ~ 1 min   Ambulation/Gait             General Gait Details: pt unable to hop on rt LE and maintain NWB status; pt with pivotal turn step with max (A) of 2 person only   Stairs            Wheelchair Mobility    Modified Rankin (Stroke Patients Only)       Balance Overall balance assessment: Needs assistance;History of Falls Sitting-balance support: Feet supported;No upper extremity supported Sitting balance-Leahy Scale: Good Sitting balance - Comments: sat EOB ~5 min    Standing balance support: During functional activity;Bilateral upper extremity supported Standing balance-Leahy Scale: Zero Standing balance comment: 2 person (A) and RW to balance                     Cognition Arousal/Alertness: Awake/alert Behavior During Therapy: WFL for tasks assessed/performed Overall Cognitive Status: Within Functional Limits for tasks assessed                      Exercises General Exercises - Lower Extremity Ankle Circles/Pumps: AROM;Both;15 reps    General Comments        Pertinent Vitals/Pain Pain Assessment: 0-10 Pain Score: 8  Pain Location: Lt LE  Pain Descriptors / Indicators: Constant;Throbbing Pain Intervention(s): Monitored during session;Repositioned;Patient requesting pain meds-RN notified    Home Living  Prior Function            PT Goals (current goals can now be found in the care plan section) Acute Rehab PT Goals Patient Stated Goal: " i want to stay another day and maybe go home" PT Goal Formulation: With patient Time For Goal Achievement: 03/29/14 Potential to Achieve Goals: Good Progress towards PT goals: Progressing toward goals    Frequency  Min 5X/week    PT Plan Current plan remains appropriate    Co-evaluation              End of Session Equipment Utilized During Treatment: Gait belt Activity Tolerance: Patient limited by pain Patient left: in chair;with call bell/phone within reach     Time: 1035-1059 PT Time Calculation (min) (ACUTE ONLY): 24 min  Charges:  $Therapeutic Activity: 23-37 mins                    G CodesDonell Sievert, [place]  [phone] 03/21/2014, 11:38 AM

## 2014-03-21 NOTE — Progress Notes (Signed)
[name] PROGRESS NOTE  Chad [name] WUJ:[id number] DOB: May 05, 1955 DOA: 03/14/2014 PCP: [name], MD  Brief narrative 59 year old male with CKD stage V Arlana Pouch is post renal transplant on immunosuppressive therapy, nonischemic cardiac EF of 37, eighth, See following renal transplant, hypertension admitted to orthopedic service for partial excision of the left femur ,  removal of hardware and insertion of antibiotic beats. Hospitalist following for management of chronic medical issues.   Assessment/Plan: Status post hardware removal and antibiotic bead placement over left leg Slowly improving. Dressing intact and clean. Afebrile overnight. Still has leukocytosis which is slowly improving. Continue Augmentin per ID until follow-up in the office in next 3-4 weeks. All operated cultures have been negative.  Leukocytosis Possibly related to steroid. Cultures have been negative. Has had low-grade temperature yesterday afebrile overnight.   Acute on chronic kidney disease disease stage V with history of renal transplant Baseline creatinine around 4. Appears to be prerenal with dehydration and blood loss postop. Creatinine peaked at 6.2 and has now been trending down. Rapamune trough pending. Added calcitriol and renvela by renal.  Hypertension/hypotension Patient became hypotensive postop. Blood pressure medications have been discontinued. (On Coreg, Norvasc and hydralazine)  Acute blood loss anemia Transfused on 2/18 and 2/22. Hemoglobin 7.7 today (from 6.5 yesterday)  History of renal transplant Continue Rapamune (levels pending) and prednisone.  Nonischemic cardiomyopathy Resumed lasix. Hold  metolazone given worsened renal function. Continue aspirin and statin. Stable on telemetry. Continue statin.  A flutter Rate controlled. On amiodarone. Not on anticoagulation.  Hypertension On Lasix and Imdur. Held Coreg, Norvasc and hydralazine due to hypotension  postop.  History of gout Continue allopurinol   GERD Will d/c PPI      Code Status: Full code Family Communication: None at bedside Disposition Plan: Skilled nursing facility when ready per primary team. Okay for discharge from our standpoint.     Antibiotics:  Oral Augmentin to continue for next 3-4 weeks until seen by Dr. Orvan Falconer in the office  HPI/Subjective: Patient seen and examined . Reports some pain in his legs after he fell over the weekend. Denies SOB.   Objective: Filed Vitals:   03/21/14 0458  BP: 132/65  Pulse: 86  Temp: 98.8 F (37.1 C)  Resp:     Intake/Output Summary (Last 24 hours) at 03/21/14 1329 Last data filed at 03/21/14 0500  Gross per 24 hour  Intake   1085 ml  Output   1450 ml  Net   -365 ml   Filed Weights   03/14/14 0931 03/21/14 0457  Weight: 123.378 kg (272 lb) 128.413 kg (283 lb 1.6 oz)    Exam:   General:  Middle aged male lying in bed in no acute distress, appears fatigued  HEENT: Pallor present, moist oral mucosa, supple neck  Chest: Clear to auscultation bilaterally, no added sounds  CVS: S1 and S2 irregular, no murmurs rub or gallop  GI: Soft, nondistended, nontender, bowel sounds present  musculoskeletal: warm, dressing over left leg appears clean, trace edema  CNS: Alert and oriented    Data Reviewed: Basic Metabolic Panel:  Recent Labs Lab 03/17/14 0651 03/18/14 0540 03/19/14 0625 03/20/14 0451 03/21/14 0714  NA 135 135 136 132* 136  K 5.0 4.5 3.8 3.6 3.5  CL 98 95* 96 94* 97  CO2 28 27 27 26 29   GLUCOSE 103* 102* 88 91 102*  BUN 126* 125* 118* 115* 114*  CREATININE 6.22* 6.14* 5.95* 5.60* 5.31*  CALCIUM 7.9* 8.3* 8.4 8.0* 8.4  PHOS  --   --  8.0* 8.1* 7.4*   Liver Function Tests:  Recent Labs Lab 03/15/14 0544 03/19/14 0625 03/20/14 0451 03/21/14 0714  AST 22  --   --   --   ALT 10  --   --   --   ALKPHOS 28*  --   --   --   BILITOT 0.7  --   --   --   PROT 5.6*  --   --   --    ALBUMIN 2.2* 2.1* 1.9* 2.1*   No results for input(s): LIPASE, AMYLASE in the last 168 hours.  Recent Labs Lab 03/18/14 1541  AMMONIA 23   CBC:  Recent Labs Lab 03/16/14 0723 03/17/14 0651 03/18/14 0540 03/20/14 0451 03/21/14 0714  WBC 15.6* 16.5* 18.1* 19.4* 18.0*  HGB 6.7* 8.4* 8.0* 6.5* 7.7*  HCT 20.7* 25.9* 24.2* 19.6* 23.3*  MCV 98.1 95.9 95.3 95.6 93.6  PLT 120* 116* 130* 131* 154   Cardiac Enzymes: No results for input(s): CKTOTAL, CKMB, CKMBINDEX, TROPONINI in the last 168 hours. BNP (last 3 results) No results for input(s): BNP in the last 8760 hours.  ProBNP (last 3 results) No results for input(s): PROBNP in the last 8760 hours.  CBG:  Recent Labs Lab 03/18/14 0534 03/18/14 1339  GLUCAP 98 121*    Recent Results (from the past 240 hour(s))  Anaerobic culture     Status: None   Collection Time: 03/14/14 12:40 PM  Result Value Ref Range Status   Specimen Description WOUND LEFT THIGH  Final   Special Requests NONE  Final   Gram Stain   Final    RARE WBC PRESENT, PREDOMINANTLY PMN NO SQUAMOUS EPITHELIAL CELLS SEEN NO ORGANISMS SEEN Performed at [facility]    Culture   Final    NO ANAEROBES ISOLATED Performed at [facility]    Report Status 03/19/2014 FINAL  Final  Gram stain     Status: None   Collection Time: 03/14/14 12:40 PM  Result Value Ref Range Status   Specimen Description WOUND LEFT THIGH  Final   Special Requests NONE  Final   Gram Stain   Final    FEW WBC PRESENT, PREDOMINANTLY PMN NO ORGANISMS SEEN Gram Stain Report Called to,Read Back By and Verified With: D.JOHNSON,RN 03/14/14 1320 BY BSLADE    Report Status 03/14/2014 FINAL  Final  Wound culture     Status: None   Collection Time: 03/14/14 12:40 PM  Result Value Ref Range Status   Specimen Description WOUND LEFT THIGH  Final   Special Requests NONE  Final   Gram Stain   Final    FEW WBC PRESENT, PREDOMINANTLY PMN NO SQUAMOUS EPITHELIAL CELLS SEEN NO  ORGANISMS SEEN Performed at [facility] Performed at [facility]    Culture   Final    NO GROWTH 2 DAYS Note: Gram Stain Report Called to,Read Back By and Verified With: D Laural Benes RN 03/14/14 1320 BY BSLADE Performed at [facility]    Report Status 03/16/2014 FINAL  Final  Gram stain     Status: None   Collection Time: 03/14/14  1:29 PM  Result Value Ref Range Status   Specimen Description TISSUE LEG LEFT  Final   Special Requests SPEC (C) PT ON VANCOMYCIN  Final   Gram Stain NO WBC SEEN NO ORGANISMS SEEN   Final   Report Status 03/14/2014 FINAL  Final  Anaerobic culture  Status: None   Collection Time: 03/14/14  1:29 PM  Result Value Ref Range Status   Specimen Description TISSUE LEG LEFT  Final   Special Requests NON UNION LEFT FEMUR PT ON VANCOMYCIN  Final   Gram Stain   Final    NO WBC SEEN NO ORGANISMS SEEN Performed at [facility] Performed at [facility]    Culture   Final    NO ANAEROBES ISOLATED Performed at [facility]    Report Status 03/19/2014 FINAL  Final  Tissue culture     Status: None   Collection Time: 03/14/14  1:29 PM  Result Value Ref Range Status   Specimen Description TISSUE LEG LEFT  Final   Special Requests NON UNION LEFT FEMUR PT ON VANCOMYCIN  Final   Gram Stain   Final    NO WBC SEEN NO ORGANISMS SEEN Performed at [facility] Performed at [facility]    Culture   Final    NO GROWTH 3 DAYS Performed at [facility]    Report Status 03/18/2014 FINAL  Final  Anaerobic culture     Status: None   Collection Time: 03/14/14  1:37 PM  Result Value Ref Range Status   Specimen Description TISSUE LEG LEFT  Final   Special Requests NON UNION LEFT FEMUR SPEC (B) PT ON VANCOMYCIN  Final   Gram Stain   Final    NO WBC SEEN NO ORGANISMS SEEN Performed at [facility] Performed at [facility]    Culture   Final    NO ANAEROBES ISOLATED Performed at  [facility]    Report Status 03/19/2014 FINAL  Final  Anaerobic culture     Status: None   Collection Time: 03/14/14  1:37 PM  Result Value Ref Range Status   Specimen Description TISSUE LEG LEFT  Final   Special Requests NON UNION LEFT FEMUR SPEC (D) PT ON VANCOMYCINA  Final   Gram Stain   Final    FEW WBC PRESENT, PREDOMINANTLY MONONUCLEAR NO ORGANISMS SEEN Performed at [facility] Performed at [facility]    Culture   Final    NO ANAEROBES ISOLATED Performed at [facility]    Report Status 03/19/2014 FINAL  Final  Gram stain     Status: None   Collection Time: 03/14/14  1:37 PM  Result Value Ref Range Status   Specimen Description TISSUE LEG LEFT  Final   Special Requests NON UNION LEFT FEMUR SPEC (B) PT ON VANCOMYCIN  Final   Gram Stain NO WBC SEEN NO ORGANISMS SEEN   Final   Report Status 03/14/2014 FINAL  Final  Gram stain     Status: None   Collection Time: 03/14/14  1:37 PM  Result Value Ref Range Status   Specimen Description TISSUE LEG LEFT  Final   Special Requests NON UNION LEFT FEMUR SPEC (D) PT ON VANCOMYCIN  Final   Gram Stain   Final    FEW WBC PRESENT, PREDOMINANTLY MONONUCLEAR NO ORGANISMS SEEN    Report Status 03/15/2014 FINAL  Final  Tissue culture     Status: None   Collection Time: 03/14/14  1:37 PM  Result Value Ref Range Status   Specimen Description TISSUE LEG LEFT  Final   Special Requests NON UNION LEFT FEMUR SPEC (B) PT ON VANCOMYCIN  Final   Gram Stain   Final    NO WBC SEEN NO ORGANISMS SEEN Performed at  [facility] Performed at [facility]    Culture   Final    NO GROWTH 3 DAYS Performed at [facility]    Report Status 03/18/2014 FINAL  Final  Tissue culture     Status: None   Collection Time: 03/14/14  1:37 PM  Result Value Ref Range Status   Specimen Description TISSUE LEG LEFT  Final   Special Requests NON UNION LEFT FEMUR SPEC (D) PT ON VANCOMYCIN  Final   Gram  Stain   Final    FEW WBC PRESENT, PREDOMINANTLY MONONUCLEAR NO ORGANISMS SEEN Performed at [facility] Performed at [facility]    Culture   Final    NO GROWTH 3 DAYS Performed at [facility]    Report Status 03/18/2014 FINAL  Final  Culture, blood (routine x 2)     Status: None (Preliminary result)   Collection Time: 03/18/14  3:34 PM  Result Value Ref Range Status   Specimen Description BLOOD RIGHT HAND  Final   Special Requests AEB 2CC  Final   Culture   Final           BLOOD CULTURE RECEIVED NO GROWTH TO DATE CULTURE WILL BE HELD FOR 5 DAYS BEFORE ISSUING A FINAL NEGATIVE REPORT Performed at [facility]    Report Status PENDING  Incomplete     Studies: Dg Chest Port 1 View  03/20/2014   CLINICAL DATA:  Short of breath.  EXAM: PORTABLE CHEST - 1 VIEW  COMPARISON:  03/06/2014  FINDINGS: Lung volumes are low. There is central vascular congestion, but no focal lung consolidation and no evidence of pulmonary edema. No pneumothorax or convincing pleural effusion.  Cardiac silhouette is mildly enlarged. No mediastinal or hilar masses.  IMPRESSION: Central vascular congestion without overt pulmonary edema. Study limited by the AP technique and low lung volumes. No evidence of pneumonia.   Electronically Signed   By: Amie Portland M.D.   On: 03/20/2014 12:07   Dg Knee Left Port  03/20/2014   CLINICAL DATA:  Left lower leg pain. Fall on Saturday. Prior distal left femoral fracture. Hardware removal on February 16.  EXAM: PORTABLE LEFT KNEE - 1-2 VIEW  COMPARISON:  03/14/2014  FINDINGS: Complex chronic impacted distal left femoral fracture is again noted. Radiopaque material noted within the distal femur, likely antibiotic beads. The appearance of the fracture is unchanged. No acute fracture. Moderate joint effusion noted.  IMPRESSION: Chronic impacted distal left femoral fracture with stable appearance. Radiopaque antibiotic beads noted within the distal  femur.   Electronically Signed   By: Charlett Nose M.D.   On: 03/20/2014 09:53   Dg Tibia/fibula Left Port  03/20/2014   CLINICAL DATA:  Left lower leg pain. Fall on Saturday. History of prior surgery and hardware removal on February 16. History of distal left femur fracture  EXAM: PORTABLE LEFT TIBIA AND FIBULA - 2 VIEW  COMPARISON:  Knee series performed today and 03/14/2014.  FINDINGS: Soft tissue swelling noted over the anterior mid to lower left calf. Old healing distal left fibular shaft fracture. Callus formation noted. The fracture line appears to remain slightly evident on the lateral view. No acute fracture.  Chronic complex impacted distal left femoral fracture is again noted with radiopaque material within the distal femur, likely antibiotic beads.  IMPRESSION: Healing distal left fibular shaft fracture. Chronic complex impacted distal left femoral fracture. No acute fracture noted.   Electronically Signed   By: Caryn Bee  [place] M.D.   On: 03/20/2014 09:51    Scheduled Meds: . sodium chloride   Intravenous Once  . sodium chloride   Intravenous Once  . allopurinol  100 mg Oral BID  . amiodarone  100 mg Oral Daily  . amoxicillin-clavulanate  1 tablet Oral Daily  . calcitRIOL  0.5 mcg Oral Daily  . Cholecalciferol  5,000 Units Oral Daily  . docusate sodium  100 mg Oral BID  . enoxaparin (LOVENOX) injection  40 mg Subcutaneous Q24H  . furosemide  40 mg Oral BID  . isosorbide mononitrate  60 mg Oral Daily  . pravastatin  40 mg Oral QHS  . predniSONE  10 mg Oral Daily  . sevelamer carbonate  1,600 mg Oral TID WC  . sirolimus  1.2 mg Oral q morning - 10a   Continuous Infusions: . sodium chloride 50 mL/hr at 03/21/14 0601      Time spent: 25 minutes    Eddie North  [name] Pager [phone] If 7PM-7AM, please contact night-coverage at [email or web address], password [facility] 03/21/2014, 1:29 PM  LOS: 7 days

## 2014-03-21 NOTE — Progress Notes (Signed)
[facility] Progress Note  Subjective  Sitting in bedside chair, has been up for awhile Unable to stand-pivot back to bed, waiting for lift   +lightheadedness and some SOB   Review of Systems  Constitutional: Negative for fever and chills.       + lightheadedness   Eyes: Negative for blurred vision.  Respiratory: Positive for shortness of breath.   Cardiovascular: Negative for chest pain and palpitations.  Gastrointestinal: Negative for nausea, vomiting and abdominal pain.  Genitourinary: Negative for dysuria.  Musculoskeletal:       Dec L leg pain   Neurological: Negative for tingling and headaches.     Objective   BP 132/65 mmHg  Pulse 86  Temp(Src) 98.8 F (37.1 C) (Oral)  Resp 18  Ht 6' (1.829 m)  Wt 128.413 kg (283 lb 1.6 oz)  BMI 38.39 kg/m2  SpO2 97%  Intake/Output      02/22 0701 - 02/23 0700 02/23 0701 - 02/24 0700   P.O. 1200    Blood 660    Total Intake(mL/kg) 1860 (14.5)    Urine (mL/kg/hr) 1750 (0.6)    Total Output 1750     Net +110            Labs  Results for Chad, [name] (MRN [id number]) as of 03/21/2014 16:05  Ref. Range 03/21/2014 07:14  Sodium Latest Range: 135-145 mmol/L 136  Potassium Latest Range: 3.5-5.1 mmol/L 3.5  Chloride Latest Range: 96-112 mmol/L 97  CO2 Latest Range: 19-32 mmol/L 29  BUN Latest Range: 6-23 mg/dL 409 (H)  Creatinine Latest Range: 0.50-1.35 mg/dL 8.11 (H)  Calcium Latest Range: 8.4-10.5 mg/dL 8.4  GFR calc non Af Amer Latest Range: >90 mL/min 11 (L)  GFR calc Af Amer Latest Range: >90 mL/min 13 (L)  Glucose Latest Range: 70-99 mg/dL 914 (H)  Anion gap Latest Range: 5-15  10  Phosphorus Latest Range: 2.3-4.6 mg/dL 7.4 (H)  Albumin Latest Range: 3.5-5.2 g/dL 2.1 (L)  WBC Latest Range: 4.0-10.5 K/uL 18.0 (H)  RBC Latest Range: 4.22-5.81 MIL/uL 2.49 (L)  Hemoglobin Latest Range: 13.0-17.0 g/dL 7.7 (L)  HCT Latest Range: 39.0-52.0 % 23.3 (L)  MCV Latest Range: 78.0-100.0 fL 93.6  MCH Latest  Range: 26.0-34.0 pg 30.9  MCHC Latest Range: 30.0-36.0 g/dL 78.2  RDW Latest Range: 11.5-15.5 % 17.9 (H)  Platelets Latest Range: 150-400 K/uL 154    Exam  Gen: sitting in bedside chair, appears tired Lungs: clear lung fields B, no rales or rhonchi noted Cardiac: s1 and s2 Abd: + BS Ext:       Left Lower Extremity    Dressings stable to L leg             Ext warm             Distal motor and sensory functions intact             + DP pulse     Assessment and Plan   POD/HD#: 60   59 y/o chronically ill black male with 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 Medical issues- CKD, Hep C, CHF,               Appreciate medicine and renal assistance with management of chronic medical issues             Home meds             continue to hold ASA, likely restart one plts and h/h stabilize                           Renal following for Acute on chronic renal disease                           Cr and BUN trending back down                                        Vitamin D levels are good and PTH levels look good for this particular pt   3. ABL anemia/anemia of chronic disease              very mild bump with 2 units prbc's yesterday  Transfuse another 1 unit prbc's as pt remains symptomatic            4. DVT/PE prophylaxis             Lovenox   5. FEN              as tolerated   6. Pain               Continue with current pain regimen   7. Dispo             SW consult for SNF                                      Rx rec's for dc from other services please      SNF once stable     Mearl Latin, PA-C [facility] [phone] (O) 03/21/2014 4:03 PM

## 2014-03-22 ENCOUNTER — Encounter (HOSPITAL_COMMUNITY): Payer: Self-pay | Admitting: Orthopedic Surgery

## 2014-03-22 DIAGNOSIS — B1920 Unspecified viral hepatitis C without hepatic coma: Secondary | ICD-10-CM | POA: Diagnosis present

## 2014-03-22 DIAGNOSIS — E785 Hyperlipidemia, unspecified: Secondary | ICD-10-CM | POA: Diagnosis present

## 2014-03-22 DIAGNOSIS — S72412K Displaced unspecified condyle fracture of lower end of left femur, subsequent encounter for closed fracture with nonunion: Secondary | ICD-10-CM

## 2014-03-22 DIAGNOSIS — S72432K Displaced fracture of medial condyle of left femur, subsequent encounter for closed fracture with nonunion: Secondary | ICD-10-CM

## 2014-03-22 DIAGNOSIS — M109 Gout, unspecified: Secondary | ICD-10-CM | POA: Diagnosis not present

## 2014-03-22 DIAGNOSIS — S72422K Displaced fracture of lateral condyle of left femur, subsequent encounter for closed fracture with nonunion: Secondary | ICD-10-CM | POA: Diagnosis present

## 2014-03-22 DIAGNOSIS — N2581 Secondary hyperparathyroidism of renal origin: Secondary | ICD-10-CM | POA: Diagnosis present

## 2014-03-22 LAB — CBC
HCT: 25.2 % — ABNORMAL LOW (ref 39.0–52.0)
Hemoglobin: 8.3 g/dL — ABNORMAL LOW (ref 13.0–17.0)
MCH: 29.9 pg (ref 26.0–34.0)
MCHC: 32.9 g/dL (ref 30.0–36.0)
MCV: 90.6 fL (ref 78.0–100.0)
PLATELETS: 161 10*3/uL (ref 150–400)
RBC: 2.78 MIL/uL — AB (ref 4.22–5.81)
RDW: 18.8 % — ABNORMAL HIGH (ref 11.5–15.5)
WBC: 15.8 10*3/uL — ABNORMAL HIGH (ref 4.0–10.5)

## 2014-03-22 LAB — TYPE AND SCREEN
ABO/RH(D): B POS
ANTIBODY SCREEN: NEGATIVE
UNIT DIVISION: 0
UNIT DIVISION: 0
Unit division: 0

## 2014-03-22 LAB — RENAL FUNCTION PANEL
Albumin: 2 g/dL — ABNORMAL LOW (ref 3.5–5.2)
Anion gap: 11 (ref 5–15)
BUN: 110 mg/dL — ABNORMAL HIGH (ref 6–23)
CALCIUM: 8.5 mg/dL (ref 8.4–10.5)
CO2: 30 mmol/L (ref 19–32)
CREATININE: 5.02 mg/dL — AB (ref 0.50–1.35)
Chloride: 94 mmol/L — ABNORMAL LOW (ref 96–112)
GFR calc Af Amer: 13 mL/min — ABNORMAL LOW (ref >90)
GFR calc non Af Amer: 12 mL/min — ABNORMAL LOW (ref >90)
GLUCOSE: 94 mg/dL (ref 70–99)
Phosphorus: 7.1 mg/dL — ABNORMAL HIGH (ref 2.3–4.6)
Potassium: 3.2 mmol/L — ABNORMAL LOW (ref 3.5–5.1)
Sodium: 135 mmol/L (ref 135–145)

## 2014-03-22 MED ORDER — ASPIRIN 325 MG PO TABS
325.0000 mg | ORAL_TABLET | Freq: Every day | ORAL | Status: DC
  Filled 2014-03-22: qty 1

## 2014-03-22 MED ORDER — SENNOSIDES-DOCUSATE SODIUM 8.6-50 MG PO TABS: 1.0000 | ORAL_TABLET | Freq: Every evening | ORAL | Status: DC | PRN

## 2014-03-22 MED ORDER — ACETAMINOPHEN 325 MG PO TABS: 650.0000 mg | ORAL_TABLET | Freq: Four times a day (QID) | ORAL | Status: DC | PRN

## 2014-03-22 MED ORDER — FUROSEMIDE 40 MG PO TABS: 80.0000 mg | ORAL_TABLET | Freq: Two times a day (BID) | ORAL | Status: DC

## 2014-03-22 MED ORDER — POTASSIUM CHLORIDE CRYS ER 20 MEQ PO TBCR: 40.0000 meq | EXTENDED_RELEASE_TABLET | Freq: Once | ORAL | Status: DC

## 2014-03-22 MED ORDER — METHOCARBAMOL 500 MG PO TABS
1000.0000 mg | ORAL_TABLET | Freq: Four times a day (QID) | ORAL | Status: DC | PRN
Start: 2014-03-22 — End: 2014-06-01

## 2014-03-22 MED ORDER — FUROSEMIDE 80 MG PO TABS: 40.0000 mg | ORAL_TABLET | Freq: Two times a day (BID) | ORAL | Status: DC

## 2014-03-22 MED ORDER — TRAMADOL HCL 50 MG PO TABS: 50.0000 mg | ORAL_TABLET | Freq: Four times a day (QID) | ORAL | Status: DC | PRN

## 2014-03-22 MED ORDER — AMOXICILLIN-POT CLAVULANATE 500-125 MG PO TABS: 1.0000 | ORAL_TABLET | Freq: Every day | ORAL | Status: DC

## 2014-03-22 MED ORDER — POTASSIUM CHLORIDE 20 MEQ PO PACK: 40.0000 meq | PACK | Freq: Once | ORAL | Status: DC

## 2014-03-22 MED ORDER — DOCUSATE SODIUM 100 MG PO CAPS: 100.0000 mg | ORAL_CAPSULE | Freq: Two times a day (BID) | ORAL | Status: DC

## 2014-03-22 MED ORDER — ENOXAPARIN SODIUM 40 MG/0.4ML ~~LOC~~ SOLN: 40.0000 mg | SUBCUTANEOUS | Status: DC

## 2014-03-22 MED ORDER — OXYCODONE HCL 5 MG PO TABS: 5.0000 mg | ORAL_TABLET | ORAL | Status: DC | PRN

## 2014-03-22 NOTE — Discharge Planning (Addendum)
Patient will discharge today per MD order. Patient will discharge to either [facility]  RN to call report prior to transportation to [phone] Transportation: [facility]  CSW sent discharge summary to SNF for review.  Packet is complete.  RN, patient and family aware of discharge plans.  Chad [name], LCSWA [phone]  Psychiatric & Orthopedics (5N 1-16) Clinical Social Worker

## 2014-03-22 NOTE — Progress Notes (Signed)
[facility] Progress Note  Subjective  No new complaints today Feels better after receiving an additional unit of blood yesterday  No CP or SOB this am Eating breakfast   SBP have been elevated for last 24 hours   Review of Systems  Constitutional: Negative for fever and chills.  Respiratory: Negative for shortness of breath and wheezing.   Cardiovascular: Negative for chest pain and palpitations.  Gastrointestinal: Negative for nausea, vomiting and abdominal pain.  Neurological: Negative for headaches.     Objective   BP 159/65 mmHg  Pulse 94  Temp(Src) 98.8 F (37.1 C) (Oral)  Resp 19  Ht 6' (1.829 m)  Wt 127.007 kg (280 lb)  BMI 37.97 kg/m2  SpO2 97%  Intake/Output      02/23 0701 - 02/24 0700 02/24 0701 - 02/25 0700   P.O. 250    Blood 335    Total Intake(mL/kg) 585 (4.6)    Urine (mL/kg/hr) 1225 (0.4)    Total Output 1225     Net -640            Labs  Results for Chad [name], Chad [name] (MRN [id number]) as of 03/22/2014 08:29  Ref. Range 03/22/2014 05:00  Sodium Latest Range: 135-145 mmol/L 135  Potassium Latest Range: 3.5-5.1 mmol/L 3.2 (L)  Chloride Latest Range: 96-112 mmol/L 94 (L)  CO2 Latest Range: 19-32 mmol/L 30  BUN Latest Range: 6-23 mg/dL 099 (H)  Creatinine Latest Range: 0.50-1.35 mg/dL 8.33 (H)  Calcium Latest Range: 8.4-10.5 mg/dL 8.5  GFR calc non Af Amer Latest Range: >90 mL/min 12 (L)  GFR calc Af Amer Latest Range: >90 mL/min 13 (L)  Glucose Latest Range: 70-99 mg/dL 94  Anion gap Latest Range: 5-15  11  Phosphorus Latest Range: 2.3-4.6 mg/dL 7.1 (H)  Albumin Latest Range: 3.5-5.2 g/dL 2.0 (L)  WBC Latest Range: 4.0-10.5 K/uL 15.8 (H)  RBC Latest Range: 4.22-5.81 MIL/uL 2.78 (L)  Hemoglobin Latest Range: 13.0-17.0 g/dL 8.3 (L)  HCT Latest Range: 39.0-52.0 % 25.2 (L)  MCV Latest Range: 78.0-100.0 fL 90.6  MCH Latest Range: 26.0-34.0 pg 29.9  MCHC Latest Range: 30.0-36.0 g/dL 82.5  RDW Latest Range: 11.5-15.5 % 18.8 (H)   Platelets Latest Range: 150-400 K/uL 161    Exam  Gen: resting comfortably in bed, NAD Lungs: clear anterior fields Cardiac: s1 and s2  Abd: + BS, NT Ext:       Left Lower Extremity   Dressings stable to L leg             Ext warm             Distal motor and sensory functions intact             + DP pulse    Assessment and Plan   POD/HD#: 76   59 y/o chronically ill black male with 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 Medical issues- CKD, Hep C, CHF,               Appreciate medicine and renal assistance with management of chronic medical issues             Home meds              Restart ASA today                            Renal following for Acute on chronic renal disease                           Cr and BUN trending back down   SBP has been elevated for last 24 hours- defer to medicine/Renal regarding restarting meds                                         Vitamin D levels are good and PTH levels look good for this particular pt   3. ABL anemia/anemia of chronic disease              improved  Continue to monitor              4. DVT/PE prophylaxis             Lovenox   Dc with additional 14 days of coverage   5. FEN              as tolerated   6. Pain               Continue with current pain regimen   7. Dispo             SW consult for SNF                                      Rx rec's for dc from other services please               SNF once stable- today or tomorrow (more likely) once bed available and offer made     Mearl Latin, PA-C [facility] [phone] (O) 03/22/2014 8:25 AM

## 2014-03-22 NOTE — Progress Notes (Signed)
Physical Therapy Treatment Patient Details Name: Chad [name] MRN: [id number] DOB: 01-22-1956 Today's Date: 03/22/2014    History of Present Illness 59 y/o black male with complex medical hx including Hepatitis C and renal transplant presents for repair of L distal femur nonunion. 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 Pt sustained a ground level fall in 2014 and had numerous procedures on this. Some question as to the presence of infection. Pt remains on abx. Continues to have pain and difficulty with daily activities. Now s/p partial escision of L distal femur and placement of antibiotic beads; NWB    PT Comments    Pt agreeable to therapy. Continues to require 2 person max (A) for transfers. Pt c/o 9/10 pain after session. Cont to recommend SNF for post acute rehab.   Follow Up Recommendations  SNF     Equipment Recommendations  None recommended by PT    Recommendations for Other Services       Precautions / Restrictions Precautions Precautions: Fall Restrictions Weight Bearing Restrictions: Yes LLE Weight Bearing: Non weight bearing    Mobility  Bed Mobility Overal bed mobility: Needs Assistance Bed Mobility: Supine to Sit     Supine to sit: +2 for physical assistance;Mod assist;HOB elevated     General bed mobility comments: pt requiring incr time; able to (A) with Rt LE and UEs; (A) to bring Lt LE to/off EOB; 2nd person (A) with elevating trunk; cues for sequencing   Transfers Overall transfer level: Needs assistance Equipment used: Rolling walker (2 wheeled) Transfers: Sit to/from [employer] Sit to Stand: +2 physical assistance;From elevated surface;Mod assist Stand pivot transfers: Max assist;From elevated surface;+2 physical assistance       General transfer comment: pt able to sit EOB ~6 min; denied any dizziness or lightheadedness; pt required max (A) for safety and balance; pt unable to maintain NWB status on Lt LE; required max  facilitation to pivot weight to chair   Ambulation/Gait             General Gait Details: pt unable to hop on rt LE and maintain NWB status; pt with pivotal turn step with max (A) of 2 person only   Stairs            Wheelchair Mobility    Modified Rankin (Stroke Patients Only)       Balance Overall balance assessment: Needs assistance;History of Falls Sitting-balance support: Feet supported Sitting balance-Leahy Scale: Fair Sitting balance - Comments: bracing initially; no LOB noted Postural control: Posterior lean Standing balance support: During functional activity;Bilateral upper extremity supported Standing balance-Leahy Scale: Zero Standing balance comment: 2 person (A)                     Cognition Arousal/Alertness: Awake/alert Behavior During Therapy: WFL for tasks assessed/performed Overall Cognitive Status: Within Functional Limits for tasks assessed                      Exercises General Exercises - Lower Extremity Ankle Circles/Pumps: AROM;Both;15 reps    General Comments        Pertinent Vitals/Pain Pain Assessment: 0-10 Pain Score: 9  Pain Location: Lt LE Pain Descriptors / Indicators: Constant;Spasm Pain Intervention(s): Monitored during session;Repositioned;Patient requesting pain meds-RN notified;Ice applied    Home Living                      Prior Function  PT Goals (current goals can now be found in the care plan section) Acute Rehab PT Goals Patient Stated Goal: " to not hurt so bad" PT Goal Formulation: With patient Time For Goal Achievement: 03/29/14 Potential to Achieve Goals: Good Progress towards PT goals: Progressing toward goals    Frequency  Min 5X/week    PT Plan Current plan remains appropriate    Co-evaluation             End of Session Equipment Utilized During Treatment: Gait belt;Oxygen Activity Tolerance: Patient limited by pain;Patient limited by  fatigue Patient left: in chair;with call bell/phone within reach     Time: 0844-0902 PT Time Calculation (min) (ACUTE ONLY): 18 min  Charges:  $Therapeutic Activity: 8-22 mins                    G CodesDonell Sievert, [place]  [phone] 03/22/2014, 9:47 AM

## 2014-03-22 NOTE — Clinical Social Work Placement (Addendum)
Clinical Social Work Department   CLINICAL SOCIAL WORK PLACEMENT NOTE 03/22/2014  Patient:  Chad [name], Chad [name]  Account Number:  [id number] Admit date:  03/14/2014  Clinical Social Worker:  Read Drivers  Date/time:  03/22/2014 12:22 PM  Clinical Social Work is seeking post-discharge placement for this patient at the following level of care:   SKILLED NURSING   (*CSW will update this form in Epic as items are completed)   03/19/2014  Patient/family provided with [facility] Department of Clinical Social Work's list of facilities offering this level of care within the geographic area requested by the patient (or if unable, by the patient's family).  03/19/2014  Patient/family informed of their freedom to choose among providers that offer the needed level of care, that participate in Medicare, Medicaid or managed care program needed by the patient, have an available bed and are willing to accept the patient.  03/19/2014  Patient/family informed of [facility]' ownership interest in [facility], as well as of the fact that they are under no obligation to receive care at this facility.  PASARR submitted to EDS on 03/19/2014 PASARR number received on 03/19/2014  FL2 transmitted to all facilities in geographic area requested by pt/family on  03/19/2014 FL2 transmitted to all facilities within larger geographic area on   Patient informed that his/her managed care company has contracts with or will negotiate with  certain facilities, including the following:     Patient/family informed of bed offers received:  03/21/2014 Patient chooses bed at [facility]ician recommends and patient chooses bed at    Patient to be transferred to  [facility] on  03/22/2014 Patient to be transferred to facility by [facility] Patient and family notified of transfer on 03/22/2014 Name of family member notified:  Patient is AOX4- also spoke with Dennie Bible  The following physician request were entered  in Epic:   Additional Comments:   Vickii Penna, LCSWA [phone]  Psychiatric & Orthopedics (5N 1-16) Clinical Social Worker

## 2014-03-22 NOTE — Progress Notes (Signed)
Patient ID: Chad [name], male   DOB: 1956/01/27, 59 y.o.   MRN: [id number] [name] PROGRESS NOTE  Chad [name] FAO:[id number] DOB: 1955-05-01 DOA: 03/14/2014 PCP: [name], MD  Brief narrative:    59 year old male with CKD stage V, status post enal transplant on immunosuppressive therapy, nonischemic cardiomyopathy, (last 2 D ECHO in 05/20115 showed EF 60%, no regional wall motion abnormality), history of hypertension. Patient was admitted to orthopedic service. Patient was found to have infected non-union left femur and loose hardware. 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    Assessment/Plan:    Principal Problem: Status post hardware removal and antibiotic bead placement over left leg - Management per orthopedic surgery. - Patient was found to have infected non-union left femur and loose hardware. 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  - Continue Augmentin per ID until follow-up in the office in next 3-4 weeks. All operated cultures have been negative.   Active Problems: Leukocytosis / infected nonunion left femur - Possibly related to steroid but please note that patient was found to have infected nonunion left femur. Cultures have been negative.  - He will continue Augmentin as recommended by infectious disease.  Acute on chronic kidney disease disease stage V with history of renal transplant - Baseline creatinine around 4. Appears to be prerenal with dehydration and blood loss postop.  - Creatinine reached a plateau at 6.2 and has been trending down since then. - Continue Calcitrol and Renvela per renal recommendations  Hypertension/hypotension - Patient became hypotensive postop.  - Blood pressure is 159/65. - It would be safe to resume  Norvasc 2.5 mg daily. Can continue Imdur 60 mg daily and hydralazine may reduce to 25 mg TID. May hold coreg until BP stable while on current BP meds.  Acute blood loss anemia - Transfused on 2/18 and 2/22. Hemoglobin 8.3 this am.  History of renal transplant - Continue Rapamune (levels pending) and prednisone.  Nonischemic cardiomyopathy - Resumed lasix. Hold metolazone given worsened renal function.  - Continue aspirin and statin. Stable on telemetry. - Continue statin.  A flutter - Rate controlled. On amiodarone. Not on anticoagulation.  History of gout - Continue allopurinol  GERD - D/C'ed PPI   DVT Prophylaxis  - Lovenox subQ  Code Status: Full.  Family Communication:  plan of care discussed with the patient Disposition Plan: per primary   IV access:  Peripheral IV  Procedures and diagnostic studies in past 72 hours:    Dg Chest Port 1 View 03/20/2014  Central vascular congestion without overt pulmonary edema. Study limited by the AP technique and low lung volumes. No evidence of pneumonia.     Dg Knee Left Port 03/20/2014   Chronic impacted distal left femoral fracture with stable appearance. Radiopaque antibiotic beads noted within the distal femur.     Dg Tibia/fibula Left Port 03/20/2014 Healing distal left fibular shaft fracture. Chronic complex impacted distal left femoral fracture. No acute fracture noted.     IAnti-Infectives:   Oral Augmentin to continue for next 3-4 weeks until seen by Dr. Orvan Falconer in the office   Manson Passey, MD  [name] Pager [phone]  If 7PM-7AM, please contact night-coverage [email or web address] Password [facility] 03/22/2014, 10:19 AM   LOS: 8 days    HPI/Subjective: No acute overnight events.  Objective: Filed Vitals:  03/21/14 1745 03/21/14 2127 03/21/14 2249 03/22/14 0609  BP: 165/81 165/86 179/89 159/65  Pulse: 88 95 94 94  Temp: 99.4 F (37.4 C) 99.1 F (37.3 C) 99.3 F (37.4 C) 98.8 F (37.1 C)  TempSrc: Oral  Oral Oral Oral  Resp: 15 17 18 19   Height:      Weight:    127.007 kg (280 lb)  SpO2: 96% 100% 98% 97%    Intake/Output Summary (Last 24 hours) at 03/22/14 1019 Last data filed at 03/22/14 0600  Gross per 24 hour  Intake    585 ml  Output   1225 ml  Net   -640 ml    Exam:   General:  Pt is alert, follows commands appropriately, not in acute distress  Cardiovascular: Regular rate and rhythm, S1/S2 (+)  Respiratory: no wheezing, no crackles, no rhonchi  Abdomen: Soft, non tender, non distended, bowel sounds present  Extremities: pulses palpable  Neuro: Grossly nonfocal  Data Reviewed: Basic Metabolic Panel:  Recent Labs Lab 03/18/14 0540 03/19/14 0625 03/20/14 0451 03/21/14 0714 03/22/14 0500  NA 135 136 132* 136 135  K 4.5 3.8 3.6 3.5 3.2*  CL 95* 96 94* 97 94*  CO2 27 27 26 29 30   GLUCOSE 102* 88 91 102* 94  BUN 125* 118* 115* 114* 110*  CREATININE 6.14* 5.95* 5.60* 5.31* 5.02*  CALCIUM 8.3* 8.4 8.0* 8.4 8.5  PHOS  --  8.0* 8.1* 7.4* 7.1*   Liver Function Tests:  Recent Labs Lab 03/19/14 0625 03/20/14 0451 03/21/14 0714 03/22/14 0500  ALBUMIN 2.1* 1.9* 2.1* 2.0*   No results for input(s): LIPASE, AMYLASE in the last 168 hours.  Recent Labs Lab 03/18/14 1541  AMMONIA 23   CBC:  Recent Labs Lab 03/17/14 0651 03/18/14 0540 03/20/14 0451 03/21/14 0714 03/22/14 0500  WBC 16.5* 18.1* 19.4* 18.0* 15.8*  HGB 8.4* 8.0* 6.5* 7.7* 8.3*  HCT 25.9* 24.2* 19.6* 23.3* 25.2*  MCV 95.9 95.3 95.6 93.6 90.6  PLT 116* 130* 131* 154 161   Cardiac Enzymes: No results for input(s): CKTOTAL, CKMB, CKMBINDEX, TROPONINI in the last 168 hours. BNP: Invalid input(s): POCBNP CBG:  Recent Labs Lab 03/18/14 0534 03/18/14 1339  GLUCAP 98 121*    Anaerobic culture     Status: None   Collection Time: 03/14/14 12:40 PM  Result Value Ref Range Status   Specimen Description WOUND LEFT THIGH  Final   Special Requests NONE  Final   Gram Stain   Final     RARE WBC PRESENT, PREDOMINANTLY PMN NO SQUAMOUS EPITHELIAL CELLS SEEN NO ORGANISMS SEEN   Culture   Final    NO ANAEROBES ISOLATED    Report Status 03/19/2014 FINAL  Final  Gram stain     Status: None   Collection Time: 03/14/14 12:40 PM  Result Value Ref Range Status   Specimen Description WOUND LEFT THIGH  Final   Special Requests NONE  Final   Gram Stain   Final    FEW WBC PRESENT, PREDOMINANTLY PMN NO ORGANISMS SEEN   Report Status 03/14/2014 FINAL  Final  Wound culture     Status: None   Collection Time: 03/14/14 12:40 PM  Result Value Ref Range Status   Specimen Description WOUND LEFT THIGH  Final   Special Requests NONE  Final   Gram Stain   Final    FEW WBC PRESENT, PREDOMINANTLY PMN NO SQUAMOUS EPITHELIAL CELLS SEEN NO ORGANISMS SEEN   Culture   Final  NO GROWTH 2 DAYS    Report Status 03/16/2014 FINAL  Final  Gram stain     Status: None   Collection Time: 03/14/14  1:29 PM  Result Value Ref Range Status   Specimen Description TISSUE LEG LEFT  Final   Gram Stain NO WBC SEEN NO ORGANISMS SEEN   Final   Report Status 03/14/2014 FINAL  Final  Anaerobic culture     Status: None   Collection Time: 03/14/14  1:29 PM  Result Value Ref Range Status   Specimen Description TISSUE LEG LEFT  Final   Special Requests NON UNION LEFT FEMUR PT ON VANCOMYCIN  Final   Gram Stain   Final    NO WBC SEEN NO ORGANISMS SEEN   Culture   Final    NO ANAEROBES ISOLATED Performed at [facility]    Report Status 03/19/2014 FINAL  Final  Tissue culture     Status: None   Collection Time: 03/14/14  1:29 PM  Result Value Ref Range Status   Specimen Description TISSUE LEG LEFT  Final   Special Requests NON UNION LEFT FEMUR PT ON VANCOMYCIN  Final   Gram Stain   Final    NO WBC SEEN NO ORGANISMS SEEN   Culture   Final    NO GROWTH 3 DAYS Performed at [facility]    Report Status 03/18/2014 FINAL  Final  Anaerobic culture     Status: None   Collection Time:  03/14/14  1:37 PM  Result Value Ref Range Status   Specimen Description TISSUE LEG LEFT  Final   Special Requests NON UNION LEFT FEMUR SPEC (B) PT ON VANCOMYCIN  Final   Gram Stain   Final    NO WBC SEEN NO ORGANISMS SEEN    Culture   Final    NO ANAEROBES ISOLATED Performed at [facility]    Report Status 03/19/2014 FINAL  Final  Anaerobic culture     Status: None   Collection Time: 03/14/14  1:37 PM  Result Value Ref Range Status   Specimen Description TISSUE LEG LEFT  Final   Special Requests NON UNION LEFT FEMUR SPEC (D) PT ON VANCOMYCINA  Final   Gram Stain   Final    FEW WBC PRESENT, PREDOMINANTLY MONONUCLEAR NO ORGANISMS SEEN   Culture   Final    NO ANAEROBES ISOLATED     Report Status 03/19/2014 FINAL  Final  Gram stain     Status: None   Collection Time: 03/14/14  1:37 PM  Result Value Ref Range Status   Specimen Description TISSUE LEG LEFT  Final   Gram Stain NO WBC SEEN NO ORGANISMS SEEN   Final   Report Status 03/14/2014 FINAL  Final  Gram stain     Status: None   Collection Time: 03/14/14  1:37 PM  Result Value Ref Range Status   Specimen Description TISSUE LEG LEFT  Final   Special Requests NON UNION LEFT FEMUR SPEC (D) PT ON VANCOMYCIN  Final   Gram Stain   Final    FEW WBC PRESENT, PREDOMINANTLY MONONUCLEAR NO ORGANISMS SEEN    Report Status 03/15/2014 FINAL  Final  Tissue culture     Status: None   Collection Time: 03/14/14  1:37 PM  Result Value Ref Range Status   Specimen Description TISSUE LEG LEFT  Final   Special Requests NON UNION LEFT FEMUR SPEC (B) PT ON VANCOMYCIN  Final   Gram Stain   Final  NO WBC SEEN NO ORGANISMS SEEN    Culture   Final    NO GROWTH 3 DAYS Performed at [facility]    Report Status 03/18/2014 FINAL  Final  Tissue culture     Status: None   Collection Time: 03/14/14  1:37 PM  Result Value Ref Range Status   Specimen Description TISSUE LEG LEFT  Final   Special Requests NON UNION LEFT FEMUR  SPEC (D) PT ON VANCOMYCIN  Final   Gram Stain   Final    FEW WBC PRESENT, PREDOMINANTLY MONONUCLEAR NO ORGANISMS SEEN   Culture   Final    NO GROWTH 3 DAYS    Report Status 03/18/2014 FINAL  Final  Culture, blood (routine x 2)     Status: None (Preliminary result)   Collection Time: 03/18/14  3:34 PM  Result Value Ref Range Status   Specimen Description BLOOD RIGHT HAND  Final   Special Requests AEB 2CC  Final   Culture   Final           BLOOD CULTURE RECEIVED NO GROWTH TO DATE    Report Status PENDING  Incomplete     Scheduled Meds: . allopurinol  100 mg Oral BID  . amiodarone  100 mg Oral Daily  . amoxicillin-clavulanate  1 tablet Oral Daily  . aspirin  325 mg Oral Daily  . calcitRIOL  0.5 mcg Oral Daily  . Cholecalciferol  5,000 Units Oral Daily  . docusate sodium  100 mg Oral BID  . enoxaparin (LOVENOX) injection  40 mg Subcutaneous Q24H  . furosemide  40 mg Oral BID  . isosorbide mononitrate  60 mg Oral Daily  . pravastatin  40 mg Oral QHS  . predniSONE  10 mg Oral Daily  . sevelamer carbonate  1,600 mg Oral TID WC  . sirolimus  1.2 mg Oral q morning - 10a   Continuous Infusions: . sodium chloride 50 mL/hr at 03/21/14 0601

## 2014-03-22 NOTE — Discharge Instructions (Addendum)
[facility] Discharge Instructions   General Discharge Instructions  WEIGHT BEARING STATUS: Nonweightbearing left leg  RANGE OF MOTION/ACTIVITY: Range of motion left knee and ankle as tolerated  Wound care: Dressing changes ( to start on 03/24/2014) left thigh, left lower leg and left foot every other day area of Adaptic, 4 x 4's, Kerlix and Ace wrap to the foot and lower leg. Mepilex or 4 x 4's and tape to the left thigh. No ointments lotions or solutions should be applied to the left leg wounds  Diet: as you were eating previously.  Can use over the counter stool softeners and bowel preparations, such as Miralax, to help with bowel movements.  Narcotics can be constipating.  Be sure to drink plenty of fluids  STOP SMOKING OR USING NICOTINE PRODUCTS!!!!  As discussed nicotine severely impairs your body's ability to heal surgical and traumatic wounds but also impairs bone healing.  Wounds and bone heal by forming microscopic blood vessels (angiogenesis) and nicotine is a vasoconstrictor (essentially, shrinks blood vessels).  Therefore, if vasoconstriction occurs to these microscopic blood vessels they essentially disappear and are unable to deliver necessary nutrients to the healing tissue.  This is one modifiable factor that you can do to dramatically increase your chances of healing your injury.    (This means no smoking, no nicotine gum, patches, etc)  DO NOT USE NONSTEROIDAL ANTI-INFLAMMATORY DRUGS (NSAID'S)  Using products such as Advil (ibuprofen), Aleve (naproxen), Motrin (ibuprofen) for additional pain control during fracture healing can delay and/or prevent the healing response.  If you would like to take over the counter (OTC) medication, Tylenol (acetaminophen) is ok.  However, some narcotic medications that are given for pain control contain acetaminophen as well. Therefore, you should not exceed more than 4000 mg of tylenol in a day if you do not have liver disease.   Also note that there are may OTC medicines, such as cold medicines and allergy medicines that my contain tylenol as well.  If you have any questions about medications and/or interactions please ask your doctor/PA or your pharmacist.   PAIN MEDICATION USE AND EXPECTATIONS  You have likely been given narcotic medications to help control your pain.  After a traumatic event that results in an fracture (broken bone) with or without surgery, it is ok to use narcotic pain medications to help control one's pain.  We understand that everyone responds to pain differently and each individual patient will be evaluated on a regular basis for the continued need for narcotic medications. Ideally, narcotic medication use should last no more than 6-8 weeks (coinciding with fracture healing).   As a patient it is your responsibility as well to monitor narcotic medication use and report the amount and frequency you use these medications when you come to your office visit.   We would also advise that if you are using narcotic medications, you should take a dose prior to therapy to maximize you participation.  IF YOU ARE ON NARCOTIC MEDICATIONS IT IS NOT PERMISSIBLE TO OPERATE A MOTOR VEHICLE (MOTORCYCLE/CAR/TRUCK/MOPED) OR HEAVY MACHINERY DO NOT MIX NARCOTICS WITH OTHER CNS (CENTRAL NERVOUS SYSTEM) DEPRESSANTS SUCH AS ALCOHOL       ICE AND ELEVATE INJURED/OPERATIVE EXTREMITY  Using ice and elevating the injured extremity above your heart can help with swelling and pain control.  Icing in a pulsatile fashion, such as 20 minutes on and 20 minutes off, can be followed.    Do not place ice directly on skin. Make sure there  is a barrier between to skin and the ice pack.    Using frozen items such as frozen peas works well as the conform nicely to the are that needs to be iced.  USE AN ACE WRAP OR TED HOSE FOR SWELLING CONTROL  In addition to icing and elevation, Ace wraps or TED hose are used to help limit and resolve  swelling.  It is recommended to use Ace wraps or TED hose until you are informed to stop.    When using Ace Wraps start the wrapping distally (farthest away from the body) and wrap proximally (closer to the body)   Example: If you had surgery on your leg or thing and you do not have a splint on, start the ace wrap at the toes and work your way up to the thigh        If you had surgery on your upper extremity and do not have a splint on, start the ace wrap at your fingers and work your way up to the upper arm  IF YOU ARE IN A SPLINT OR CAST DO NOT REMOVE IT FOR ANY REASON   If your splint gets wet for any reason please contact the office immediately. You may shower in your splint or cast as long as you keep it dry.  This can be done by wrapping in a cast cover or garbage back (or similar)  Do Not stick any thing down your splint or cast such as pencils, money, or hangers to try and scratch yourself with.  If you feel itchy take benadryl as prescribed on the bottle for itching  IF YOU ARE IN A CAM BOOT (BLACK BOOT)  You may remove boot periodically. Perform daily dressing changes as noted below.  Wash the liner of the boot regularly and wear a sock when wearing the boot. It is recommended that you sleep in the boot until told otherwise  CALL THE OFFICE WITH ANY QUESTIONS OR CONCERTS: [phone]     Discharge Pin Site Instructions  Dress pins daily with Kerlix roll starting on POD 2. Wrap the Kerlix so that it tamps the skin down around the pin-skin interface to prevent/limit motion of the skin relative to the pin.  (Pin-skin motion is the primary cause of pain and infection related to external fixator pin sites).  Remove any crust or coagulum that may obstruct drainage with a saline moistened gauze or soap and water.  After POD 3, if there is no discernable drainage on the pin site dressing, the interval for change can by increased to every other day.  You may shower with the fixator,  cleaning all pin sites gently with soap and water.  If you have a surgical wound this needs to be completely dry and without drainage before showering.  The extremity can be lifted by the fixator to facilitate wound care and transfers.  Notify the office/Doctor if you experience increasing drainage, redness, or pain from a pin site, or if you notice purulent (thick, snot-like) drainage.  Discharge Wound Care Instructions  Do NOT apply any ointments, solutions or lotions to pin sites or surgical wounds.  These prevent needed drainage and even though solutions like hydrogen peroxide kill bacteria, they also damage cells lining the pin sites that help fight infection.  Applying lotions or ointments can keep the wounds moist and can cause them to breakdown and open up as well. This can increase the risk for infection. When in doubt call the office.  Surgical  incisions should be dressed daily.  If any drainage is noted, use one layer of adaptic, then gauze, Kerlix, and an ace wrap.  Once the incision is completely dry and without drainage, it may be left open to air out.  Showering may begin 36-48 hours later.  Cleaning gently with soap and water.  Traumatic wounds should be dressed daily as well.    One layer of adaptic, gauze, Kerlix, then ace wrap.  The adaptic can be discontinued once the draining has ceased    If you have a wet to dry dressing: wet the gauze with saline the squeeze as much saline out so the gauze is moist (not soaking wet), place moistened gauze over wound, then place a dry gauze over the moist one, followed by Kerlix wrap, then ace wrap.

## 2014-03-22 NOTE — Progress Notes (Signed)
OT Cancellation Note  Patient Details Name: Chad [name] MRN: [id number] DOB: [birth year]   Cancelled Treatment:    Reason Eval/Treat Not Completed: Pain limiting ability to participate;Patient declined, stating that he hurts too much on his L side and that he can't do it today  Galen Manila 03/22/2014, 10:40 AM

## 2014-03-22 NOTE — Progress Notes (Signed)
Pt transferred per stretcher with ptar to [facility], in no acute distress. placed on stretcher with lift, max assist. Report called to facility.

## 2014-03-22 NOTE — Progress Notes (Signed)
[facility] ROUNDING NOTE   Subjective:   Interval History:No complaints still about the same with some lower extremity edema and some dyspnea at rest using oxygen . Completing sentences   Vital signs in last 24 hours:  Temp:  [98.8 F (37.1 C)-99.5 F (37.5 C)] 98.8 F (37.1 C) (02/24 0609) Pulse Rate:  [88-100] 94 (02/24 0609) Resp:  [15-19] 19 (02/24 0609) BP: (159-179)/(65-89) 159/65 mmHg (02/24 0609) SpO2:  [94 %-100 %] 97 % (02/24 0609) Weight:  [127.007 kg (280 lb)] 127.007 kg (280 lb) (02/24 0609)  Weight change: -1.406 kg (-3 lb 1.6 oz) Filed Weights   03/14/14 0931 03/21/14 0457 03/22/14 0609  Weight: 123.378 kg (272 lb) 128.413 kg (283 lb 1.6 oz) 127.007 kg (280 lb)    Intake/Output: I/O last 3 completed shifts: In: 1400 [P.O.:730; Blood:670] Out: 2125 [Urine:2125]   Intake/Output this shift:     CVS- RRR RS-  Rales at bases and anteriorly ABD- BS present soft non-distended EXT- 3 + edema   Basic Metabolic Panel:  Recent Labs Lab 03/18/14 0540 03/19/14 0625 03/20/14 0451 03/21/14 0714 03/22/14 0500  NA 135 136 132* 136 135  K 4.5 3.8 3.6 3.5 3.2*  CL 95* 96 94* 97 94*  CO2 GLUCOSE 102* 88 91 102* 94  BUN 125* 118* 115* 114* 110*  CREATININE 6.14* 5.95* 5.60* 5.31* 5.02*  CALCIUM 8.3* 8.4 8.0* 8.4 8.5  PHOS  --  8.0* 8.1* 7.4* 7.1*    Liver Function Tests:  Recent Labs Lab 03/19/14 0625 03/20/14 0451 03/21/14 0714 03/22/14 0500  ALBUMIN 2.1* 1.9* 2.1* 2.0*   No results for input(s): LIPASE, AMYLASE in the last 168 hours.  Recent Labs Lab 03/18/14 1541  AMMONIA 23    CBC:  Recent Labs Lab 03/17/14 0651 03/18/14 0540 03/20/14 0451 03/21/14 0714 03/22/14 0500  WBC 16.5* 18.1* 19.4* 18.0* 15.8*  HGB 8.4* 8.0* 6.5* 7.7* 8.3*  HCT 25.9* 24.2* 19.6* 23.3* 25.2*  MCV 95.9 95.3 95.6 93.6 90.6  PLT 116* 130* 131* 154 161    Cardiac Enzymes: No results for input(s): CKTOTAL, CKMB, CKMBINDEX,  TROPONINI in the last 168 hours.  BNP: Invalid input(s): POCBNP  CBG:  Recent Labs Lab 03/18/14 0534 03/18/14 1339  GLUCAP 98 121*    Microbiology: Results for orders placed or performed during the hospital encounter of 03/14/14  Anaerobic culture     Status: None   Collection Time: 03/14/14 12:40 PM  Result Value Ref Range Status   Specimen Description WOUND LEFT THIGH  Final   Special Requests NONE  Final   Gram Stain   Final    RARE WBC PRESENT, PREDOMINANTLY PMN NO SQUAMOUS EPITHELIAL CELLS SEEN NO ORGANISMS SEEN Performed at [facility]    Culture   Final    NO ANAEROBES ISOLATED Performed at [facility]    Report Status 03/19/2014 FINAL  Final  Gram stain     Status: None   Collection Time: 03/14/14 12:40 PM  Result Value Ref Range Status   Specimen Description WOUND LEFT THIGH  Final   Special Requests NONE  Final   Gram Stain   Final    FEW WBC PRESENT, PREDOMINANTLY PMN NO ORGANISMS SEEN Gram Stain Report Called to,Read Back By and Verified With: D.JOHNSON,RN 03/14/14 1320 BY BSLADE    Report Status 03/14/2014 FINAL  Final  Wound culture     Status: None   Collection Time: 03/14/14 12:40 PM  Result Value Ref Range Status   Specimen Description WOUND LEFT THIGH  Final   Special Requests NONE  Final   Gram Stain   Final    FEW WBC PRESENT, PREDOMINANTLY PMN NO SQUAMOUS EPITHELIAL CELLS SEEN NO ORGANISMS SEEN Performed at [facility] Performed at [facility]    Culture   Final    NO GROWTH 2 DAYS Note: Gram Stain Report Called to,Read Back By and Verified With: D Laural Benes RN 03/14/14 1320 BY BSLADE Performed at [facility]    Report Status 03/16/2014 FINAL  Final  Gram stain     Status: None   Collection Time: 03/14/14  1:29 PM  Result Value Ref Range Status   Specimen Description TISSUE LEG LEFT  Final   Special Requests SPEC (C) PT ON VANCOMYCIN  Final   Gram Stain NO WBC SEEN NO ORGANISMS SEEN    Final   Report Status 03/14/2014 FINAL  Final  Anaerobic culture     Status: None   Collection Time: 03/14/14  1:29 PM  Result Value Ref Range Status   Specimen Description TISSUE LEG LEFT  Final   Special Requests NON UNION LEFT FEMUR PT ON VANCOMYCIN  Final   Gram Stain   Final    NO WBC SEEN NO ORGANISMS SEEN Performed at [facility] Performed at [facility]    Culture   Final    NO ANAEROBES ISOLATED Performed at [facility]    Report Status 03/19/2014 FINAL  Final  Tissue culture     Status: None   Collection Time: 03/14/14  1:29 PM  Result Value Ref Range Status   Specimen Description TISSUE LEG LEFT  Final   Special Requests NON UNION LEFT FEMUR PT ON VANCOMYCIN  Final   Gram Stain   Final    NO WBC SEEN NO ORGANISMS SEEN Performed at [facility] Performed at [facility]    Culture   Final    NO GROWTH 3 DAYS Performed at [facility]    Report Status 03/18/2014 FINAL  Final  Anaerobic culture     Status: None   Collection Time: 03/14/14  1:37 PM  Result Value Ref Range Status   Specimen Description TISSUE LEG LEFT  Final   Special Requests NON UNION LEFT FEMUR SPEC (B) PT ON VANCOMYCIN  Final   Gram Stain   Final    NO WBC SEEN NO ORGANISMS SEEN Performed at [facility] Performed at [facility]    Culture   Final    NO ANAEROBES ISOLATED Performed at [facility]    Report Status 03/19/2014 FINAL  Final  Anaerobic culture     Status: None   Collection Time: 03/14/14  1:37 PM  Result Value Ref Range Status   Specimen Description TISSUE LEG LEFT  Final   Special Requests NON UNION LEFT FEMUR SPEC (D) PT ON VANCOMYCINA  Final   Gram Stain   Final    FEW WBC PRESENT, PREDOMINANTLY MONONUCLEAR NO ORGANISMS SEEN Performed at [facility] Performed at [facility]    Culture   Final    NO ANAEROBES ISOLATED Performed at [facility]    Report Status  03/19/2014 FINAL  Final  Gram stain     Status: None   Collection Time: 03/14/14  1:37 PM  Result Value Ref Range Status   Specimen Description TISSUE LEG LEFT  Final   Special  Requests NON UNION LEFT FEMUR SPEC (B) PT ON VANCOMYCIN  Final   Gram Stain NO WBC SEEN NO ORGANISMS SEEN   Final   Report Status 03/14/2014 FINAL  Final  Gram stain     Status: None   Collection Time: 03/14/14  1:37 PM  Result Value Ref Range Status   Specimen Description TISSUE LEG LEFT  Final   Special Requests NON UNION LEFT FEMUR SPEC (D) PT ON VANCOMYCIN  Final   Gram Stain   Final    FEW WBC PRESENT, PREDOMINANTLY MONONUCLEAR NO ORGANISMS SEEN    Report Status 03/15/2014 FINAL  Final  Tissue culture     Status: None   Collection Time: 03/14/14  1:37 PM  Result Value Ref Range Status   Specimen Description TISSUE LEG LEFT  Final   Special Requests NON UNION LEFT FEMUR SPEC (B) PT ON VANCOMYCIN  Final   Gram Stain   Final    NO WBC SEEN NO ORGANISMS SEEN Performed at [facility] Performed at [facility]    Culture   Final    NO GROWTH 3 DAYS Performed at [facility]    Report Status 03/18/2014 FINAL  Final  Tissue culture     Status: None   Collection Time: 03/14/14  1:37 PM  Result Value Ref Range Status   Specimen Description TISSUE LEG LEFT  Final   Special Requests NON UNION LEFT FEMUR SPEC (D) PT ON VANCOMYCIN  Final   Gram Stain   Final    FEW WBC PRESENT, PREDOMINANTLY MONONUCLEAR NO ORGANISMS SEEN Performed at [facility] Performed at [facility]    Culture   Final    NO GROWTH 3 DAYS Performed at [facility]    Report Status 03/18/2014 FINAL  Final  Culture, blood (routine x 2)     Status: None (Preliminary result)   Collection Time: 03/18/14  3:34 PM  Result Value Ref Range Status   Specimen Description BLOOD RIGHT HAND  Final   Special Requests AEB 2CC  Final   Culture   Final           BLOOD CULTURE RECEIVED NO  GROWTH TO DATE CULTURE WILL BE HELD FOR 5 DAYS BEFORE ISSUING A FINAL NEGATIVE REPORT Performed at [facility]    Report Status PENDING  Incomplete    Coagulation Studies: No results for input(s): LABPROT, INR in the last 72 hours.  Urinalysis: No results for input(s): COLORURINE, LABSPEC, PHURINE, GLUCOSEU, HGBUR, BILIRUBINUR, KETONESUR, PROTEINUR, UROBILINOGEN, NITRITE, LEUKOCYTESUR in the last 72 hours.  Invalid input(s): APPERANCEUR    Imaging: No results found.   Medications:   . sodium chloride 50 mL/hr at 03/21/14 0601   . sodium chloride   Intravenous Once  . sodium chloride   Intravenous Once  . sodium chloride   Intravenous Once  . allopurinol  100 mg Oral BID  . amiodarone  100 mg Oral Daily  . amoxicillin-clavulanate  1 tablet Oral Daily  . aspirin  325 mg Oral Daily  . calcitRIOL  0.5 mcg Oral Daily  . Cholecalciferol  5,000 Units Oral Daily  . docusate sodium  100 mg Oral BID  . enoxaparin (LOVENOX) injection  40 mg Subcutaneous Q24H  . furosemide  40 mg Oral BID  . isosorbide mononitrate  60 mg Oral Daily  . pravastatin  40 mg Oral QHS  . predniSONE  10 mg Oral Daily  . sevelamer carbonate  1,600 mg  Oral TID WC  . 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   Non ischemic cardiomyopathy   Patient is going to be discharged to [facility] today   I would recommend replacing potassium today and trend renal panel labs in NH I would increase lasix to 160mg  BID F/u with Dr Allena Katz in 1 week        LOS: 8 Zitlaly Malson W    [phone] @TODAY @1 :25  PM

## 2014-03-22 NOTE — Discharge Summary (Signed)
[facility]edic [facility]uma [facility] (OTS)  Patient ID: Chad [name] MRN: [id number] DOB/AGE: 02-Aug-1955 59 y.o.  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 Carola Frost 1. Partial excision of left femur for chronic osteomyelitis. 2. Open treatment of supracondylar femur nonunion. 3. Hardware removal, left femur. 4. Insertion of antibiotic beads using a total of 29 beads with 15     beads on 1 strand and 14 beads on the other   Discharged Condition: stable  Hospital Course:   Chad [name] is a 59 year old black male with numerous medical comorbidities, medically complex patient who sustained a ground-level fall with resultant left distal femur fracture almost 2 years ago. Patient has had numerous procedures on this leg. He has had chronic nonunion and osteomyelitis of his left femur. Patient is being followed by infectious disease. He also is status post renal transplant and is on chronic medications for his CKD.  Patient was admitted to the hospital on 03/14/2014 with the procedure noted above was performed. After surgery patient was transferred to the PACU for recovery from anesthesia and transferred to the  orthopedic floor for continued observation, pain control and to begin therapies. Given his numerous medical comorbidities a medicine consult was obtained immediately postoperatively to assist with medical management of the patient.  On postoperative day #1 patient was doing very well. His pain was well-controlled and he was in good spirits. Patient states that his leg was feeling the best it's felt in years. We did notice a rise in his BUN and creatinine. As well as a slight dip in his H&H. ABIs were performed on postoperative day #1 and demonstrated essentially normal parameters regarding his lower extremities. Lovenox was also started on postoperative day #1. Patient was maintained on vancomycin and Zosyn for antibiosis  Postoperative day #2 patient continued to do well on no specific complaints his dressing was changed which showed a stable operative wounds. Cultures were still negative. His BUN and creatinine continued to increase and as such renal consult was obtained. Patient also did have further drop in his H&H and patient was given 2 units of packed red cells. His platelets continue to decline in his aspirin was stopped as well. Patient did mobilize to the chair with physical therapy. Vancomycin was also stopped due to increasing BUN/creatinine. Zosyn was continued.  Over the next several days patient continued to do well however his renal function continued to climb. Adjustments were made in terms of medication by the renal service. Patient was kept on IV fluids as it was felt that his acute on chronic kidney injury was due to hypovolemia and prerenal etiology.  Over the weekend patient did well. Still slow to mobilize. Patient states that he attempted to get up on Saturday by himself and had  a fall where his left leg hit a 3 in 1 commode. He did sustain a small skin tear to the anterior lower leg. X-rays were obtained which were negative for any acute fractures or change in alignment of his distal  femur.  Recheck of his H&H on postoperative day #6 did demonstrate further drop in his hemoglobin and hematocrit. Patient was given another 2 units of packed red cells as he was symptomatic with complaints of malaise and fatigue and weakness. He was also somewhat short of breath and his clinical exam did not demonstrate any signs of volume overload. Patient tolerated these 2 units well but did require an additional unit on postoperative day #7 as it was only a modest rebound and his H&H and he was still symptomatic. On postoperative day #8 patient was doing much better. And deemed to be stable for discharge to nursing facility.   On the day of discharge patient's potassium was noted to be slightly low. It is noted that he was on Sevelamer. This was held at time of discharge as was his supplemental potassium.   Patient was transitioned to Augmentin on postoperative day #3 on the recommendations of the infectious disease service  Consults: ID, nephrology and Internal medicine  Significant Diagnostic Studies: labs:  Results for Chad, [name] (MRN [id number]) as of 03/22/2014 10:03  Ref. Range 03/22/2014 05:00  Sodium Latest Range: 135-145 mmol/L 135  Potassium Latest Range: 3.5-5.1 mmol/L 3.2 (L)  Chloride Latest Range: 96-112 mmol/L 94 (L)  CO2 Latest Range: 19-32 mmol/L 30  BUN Latest Range: 6-23 mg/dL 086 (H)  Creatinine Latest Range: 0.50-1.35 mg/dL 5.78 (H)  Calcium Latest Range: 8.4-10.5 mg/dL 8.5  GFR calc non Af Amer Latest Range: >90 mL/min 12 (L)  GFR calc Af Amer Latest Range: >90 mL/min 13 (L)  Glucose Latest Range: 70-99 mg/dL 94  Anion gap Latest Range: 5-15  11  Phosphorus Latest Range: 2.3-4.6 mg/dL 7.1 (H)  Albumin Latest Range: 3.5-5.2 g/dL 2.0 (L)  WBC Latest Range: 4.0-10.5 K/uL 15.8 (H)  RBC Latest Range: 4.22-5.81 MIL/uL 2.78 (L)  Hemoglobin Latest Range: 13.0-17.0 g/dL 8.3 (L)  HCT Latest Range: 39.0-52.0 % 25.2 (L)  MCV Latest Range: 78.0-100.0 fL 90.6  MCH Latest  Range: 26.0-34.0 pg 29.9  MCHC Latest Range: 30.0-36.0 g/dL 46.9  RDW Latest Range: 11.5-15.5 % 18.8 (H)  Platelets Latest Range: 150-400 K/uL 161   Results for Chad, [name] (MRN [id number]) as of 03/22/2014 10:03  Ref. Range 03/14/2014 09:32  Vit D, 25-Hydroxy Latest Range: 30.0-100.0 ng/mL 46.3  Vitamin D 1, 25 (OH) Total Latest Units: pg/mL 53  Vitamin D2 1, 25 (OH) Latest Units: pg/mL <10  Vitamin D3 1, 25 (OH) Latest Units: pg/mL 52   Results for Chad, [name] (MRN [id number]) as of 03/22/2014 10:03  Ref. Range 03/14/2014 09:32  PTH Latest Range: 15-65 pg/mL 120 (H)    Treatments: IV hydration, antibiotics: vancomycin, Zosyn and Augmentin, analgesia: Dilaudid and oxycodone, cardiac meds: Coreg, anticoagulation: ASA and LMW heparin, therapies: PT, OT, RN and SW, procedures: Packed red blood cell transfusion total of 5 units during hospitalization and surgery: As above  Discharge Exam:     Orthopaedic Trauma Service Progress Note  Subjective  No new complaints today Feels better after receiving an additional unit of blood yesterday   No CP or SOB this am Eating breakfast   SBP have been elevated for last 24 hours   Review of Systems  Constitutional: Negative for fever and  chills.  Respiratory: Negative for shortness of breath and wheezing.   Cardiovascular: Negative for chest pain and palpitations.  Gastrointestinal: Negative for nausea, vomiting and abdominal pain.  Neurological: Negative for headaches.     Objective   BP 159/65 mmHg  Pulse 94  Temp(Src) 98.8 F (37.1 C) (Oral)  Resp 19  Ht 6' (1.829 m)  Wt 127.007 kg (280 lb)  BMI 37.97 kg/m2  SpO2 97%  Intake/Output       02/23 0701 - 02/24 0700 02/24 0701 - 02/25 0700    P.O. 250     Blood 335     Total Intake(mL/kg) 585 (4.6)     Urine (mL/kg/hr) 1225 (0.4)     Total Output 1225      Net -640              Labs  Results for Chad, [name] (MRN [id number]) as of 03/22/2014 08:29   Ref.  Range  03/22/2014 05:00   Sodium  Latest Range: 135-145 mmol/L  135   Potassium  Latest Range: 3.5-5.1 mmol/L  3.2 (L)   Chloride  Latest Range: 96-112 mmol/L  94 (L)   CO2  Latest Range: 19-32 mmol/L  30   BUN  Latest Range: 6-23 mg/dL  409 (H)   Creatinine  Latest Range: 0.50-1.35 mg/dL  8.11 (H)   Calcium  Latest Range: 8.4-10.5 mg/dL  8.5   GFR calc non Af Amer  Latest Range: >90 mL/min  12 (L)   GFR calc Af Amer  Latest Range: >90 mL/min  13 (L)   Glucose  Latest Range: 70-99 mg/dL  94   Anion gap  Latest Range: 5-15   11   Phosphorus  Latest Range: 2.3-4.6 mg/dL  7.1 (H)   Albumin  Latest Range: 3.5-5.2 g/dL  2.0 (L)   WBC  Latest Range: 4.0-10.5 K/uL  15.8 (H)   RBC  Latest Range: 4.22-5.81 MIL/uL  2.78 (L)   Hemoglobin  Latest Range: 13.0-17.0 g/dL  8.3 (L)   HCT  Latest Range: 39.0-52.0 %  25.2 (L)   MCV  Latest Range: 78.0-100.0 fL  90.6   MCH  Latest Range: 26.0-34.0 pg  29.9   MCHC  Latest Range: 30.0-36.0 g/dL  91.4   RDW  Latest Range: 11.5-15.5 %  18.8 (H)   Platelets  Latest Range: 150-400 K/uL  161     Exam  Gen: resting comfortably in bed, NAD Lungs: clear anterior fields Cardiac: s1 and s2   Abd: + BS, NT Ext:        Left Lower Extremity               Dressings stable to L leg             Ext warm             Distal motor and sensory functions intact             + DP pulse    Assessment and Plan   POD/HD#: 43   59 y/o chronically ill black male with 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 Chronic nonunion and osteomyelitis L distal femur s/p ROH and excisional debridement             NWB L leg             Knee ROM ok             PT/OT  Dressing changes as needed               Ice and elevate as needed                             ID following- pt switched to augmentin              operative cultures- negative               Gram stains did not show any organisms   2. Medical issues- CKD, Hep C, CHF,                Appreciate medicine and renal assistance with management of chronic medical issues             Home meds                         Restart ASA today                            Renal following for Acute on chronic renal disease                           Cr and BUN trending back down              SBP has been elevated for last 24 hours- defer to medicine/Renal regarding restarting meds                                          Vitamin D levels are good and PTH levels look good for this particular pt   3. ABL anemia/anemia of chronic disease              improved             Continue to monitor                          4. DVT/PE prophylaxis             Lovenox               Dc with additional 14 days of coverage   5. FEN              as tolerated   6. Pain               Continue with current pain regimen   7. Dispo             SW consult for SNF                                      Rx rec's for dc from other services please               SNF once stable- today or tomorrow (more likely) once bed available and offer made      Mearl Latin, PA-C [facility] [phone] (O)   Disposition: Skilled nursing facility      Discharge Instructions    Call MD / Call 911    Complete by:  As directed  If you experience chest pain or shortness of breath, CALL 911 and be transported to the hospital emergency room.  If you develope a fever above 101 F, pus (white drainage) or increased drainage or redness at the wound, or calf pain, call your surgeon's office.     Constipation Prevention    Complete by:  As directed   Drink plenty of fluids.  Prune juice may be helpful.  You may use a stool softener, such as Colace (over the counter) 100 mg twice a day.  Use MiraLax (over the counter) for constipation as needed.     Diet - low sodium heart healthy    Complete by:  As directed      Discharge instructions    Complete by:  As directed   [facility]opaedic Trauma  Service Discharge Instructions   General Discharge Instructions  WEIGHT BEARING STATUS: Nonweightbearing left leg  RANGE OF MOTION/ACTIVITY: Range of motion left knee and ankle as tolerated  Wound care: Dressing changes to left thigh, left lower leg and left foot every other day area of Adaptic, 4 x 4's, Kerlix and Ace wrap to the foot and lower leg. Mepilex or 4 x 4's and tape to the left thigh. No ointments lotions or solutions should be applied to the left leg wounds  Diet: as you were eating previously.  Can use over the counter stool softeners and bowel preparations, such as Miralax, to help with bowel movements.  Narcotics can be constipating.  Be sure to drink plenty of fluids  STOP SMOKING OR USING NICOTINE PRODUCTS!!!!  As discussed nicotine severely impairs your body's ability to heal surgical and traumatic wounds but also impairs bone healing.  Wounds and bone heal by forming microscopic blood vessels (angiogenesis) and nicotine is a vasoconstrictor (essentially, shrinks blood vessels).  Therefore, if vasoconstriction occurs to these microscopic blood vessels they essentially disappear and are unable to deliver necessary nutrients to the healing tissue.  This is one modifiable factor that you can do to dramatically increase your chances of healing your injury.    (This means no smoking, no nicotine gum, patches, etc)  DO NOT USE NONSTEROIDAL ANTI-INFLAMMATORY DRUGS (NSAID'S)  Using products such as Advil (ibuprofen), Aleve (naproxen), Motrin (ibuprofen) for additional pain control during fracture healing can delay and/or prevent the healing response.  If you would like to take over the counter (OTC) medication, Tylenol (acetaminophen) is ok.  However, some narcotic medications that are given for pain control contain acetaminophen as well. Therefore, you should not exceed more than 4000 mg of tylenol in a day if you do not have liver disease.  Also note that there are may OTC medicines,  such as cold medicines and allergy medicines that my contain tylenol as well.  If you have any questions about medications and/or interactions please ask your doctor/PA or your pharmacist.   PAIN MEDICATION USE AND EXPECTATIONS  You have likely been given narcotic medications to help control your pain.  After a traumatic event that results in an fracture (broken bone) with or without surgery, it is ok to use narcotic pain medications to help control one's pain.  We understand that everyone responds to pain differently and each individual patient will be evaluated on a regular basis for the continued need for narcotic medications. Ideally, narcotic medication use should last no more than 6-8 weeks (coinciding with fracture healing).   As a patient it is your responsibility as well to monitor narcotic medication use and report the amount  and frequency you use these medications when you come to your office visit.   We would also advise that if you are using narcotic medications, you should take a dose prior to therapy to maximize you participation.  IF YOU ARE ON NARCOTIC MEDICATIONS IT IS NOT PERMISSIBLE TO OPERATE A MOTOR VEHICLE (MOTORCYCLE/CAR/TRUCK/MOPED) OR HEAVY MACHINERY DO NOT MIX NARCOTICS WITH OTHER CNS (CENTRAL NERVOUS SYSTEM) DEPRESSANTS SUCH AS ALCOHOL       ICE AND ELEVATE INJURED/OPERATIVE EXTREMITY  Using ice and elevating the injured extremity above your heart can help with swelling and pain control.  Icing in a pulsatile fashion, such as 20 minutes on and 20 minutes off, can be followed.    Do not place ice directly on skin. Make sure there is a barrier between to skin and the ice pack.    Using frozen items such as frozen peas works well as the conform nicely to the are that needs to be iced.  USE AN ACE WRAP OR TED HOSE FOR SWELLING CONTROL  In addition to icing and elevation, Ace wraps or TED hose are used to help limit and resolve swelling.  It is recommended to use Ace wraps  or TED hose until you are informed to stop.    When using Ace Wraps start the wrapping distally (farthest away from the body) and wrap proximally (closer to the body)   Example: If you had surgery on your leg or thing and you do not have a splint on, start the ace wrap at the toes and work your way up to the thigh        If you had surgery on your upper extremity and do not have a splint on, start the ace wrap at your fingers and work your way up to the upper arm  IF YOU ARE IN A SPLINT OR CAST DO NOT REMOVE IT FOR ANY REASON   If your splint gets wet for any reason please contact the office immediately. You may shower in your splint or cast as long as you keep it dry.  This can be done by wrapping in a cast cover or garbage back (or similar)  Do Not stick any thing down your splint or cast such as pencils, money, or hangers to try and scratch yourself with.  If you feel itchy take benadryl as prescribed on the bottle for itching  IF YOU ARE IN A CAM BOOT (BLACK BOOT)  You may remove boot periodically. Perform daily dressing changes as noted below.  Wash the liner of the boot regularly and wear a sock when wearing the boot. It is recommended that you sleep in the boot until told otherwise  CALL THE OFFICE WITH ANY QUESTIONS OR CONCERTS: [phone]     Discharge Pin Site Instructions  Dress pins daily with Kerlix roll starting on POD 2. Wrap the Kerlix so that it tamps the skin down around the pin-skin interface to prevent/limit motion of the skin relative to the pin.  (Pin-skin motion is the primary cause of pain and infection related to external fixator pin sites).  Remove any crust or coagulum that may obstruct drainage with a saline moistened gauze or soap and water.  After POD 3, if there is no discernable drainage on the pin site dressing, the interval for change can by increased to every other day.  You may shower with the fixator, cleaning all pin sites gently with soap and water.   If you have a surgical wound  this needs to be completely dry and without drainage before showering.  The extremity can be lifted by the fixator to facilitate wound care and transfers.  Notify the office/Doctor if you experience increasing drainage, redness, or pain from a pin site, or if you notice purulent (thick, snot-like) drainage.  Discharge Wound Care Instructions  Do NOT apply any ointments, solutions or lotions to pin sites or surgical wounds.  These prevent needed drainage and even though solutions like hydrogen peroxide kill bacteria, they also damage cells lining the pin sites that help fight infection.  Applying lotions or ointments can keep the wounds moist and can cause them to breakdown and open up as well. This can increase the risk for infection. When in doubt call the office.  Surgical incisions should be dressed daily.  If any drainage is noted, use one layer of adaptic, then gauze, Kerlix, and an ace wrap.  Once the incision is completely dry and without drainage, it may be left open to air out.  Showering may begin 36-48 hours later.  Cleaning gently with soap and water.  Traumatic wounds should be dressed daily as well.    One layer of adaptic, gauze, Kerlix, then ace wrap.  The adaptic can be discontinued once the draining has ceased    If you have a wet to dry dressing: wet the gauze with saline the squeeze as much saline out so the gauze is moist (not soaking wet), place moistened gauze over wound, then place a dry gauze over the moist one, followed by Kerlix wrap, then ace wrap.     Increase activity slowly as tolerated    Complete by:  As directed      Non weight bearing    Complete by:  As directed   Laterality:  left  Extremity:  Lower            Medication List    STOP taking these medications        camphor-menthol lotion  Commonly known as:  SARNA      TAKE these medications        acetaminophen 325 MG tablet  Commonly known as:  TYLENOL   Take 2 tablets (650 mg total) by mouth every 6 (six) hours as needed for moderate pain or fever.     allopurinol 100 MG tablet  Commonly known as:  ZYLOPRIM  Take 1 tablet (100 mg total) by mouth 2 (two) times daily.     amiodarone 100 MG tablet  Commonly known as:  PACERONE  TAKE 1 TABLET BY MOUTH EVERY DAY     amoxicillin-clavulanate 500-125 MG per tablet  Commonly known as:  AUGMENTIN  Take 1 tablet (500 mg total) by mouth daily.     aspirin 325 MG tablet  Take 325 mg by mouth daily.     calcitRIOL 0.5 MCG capsule  Commonly known as:  ROCALTROL  Take 0.5 mcg by mouth daily.     docusate sodium 100 MG capsule  Commonly known as:  COLACE  Take 1 capsule (100 mg total) by mouth 2 (two) times daily.     enoxaparin 40 MG/0.4ML injection  Commonly known as:  LOVENOX  Inject 0.4 mLs (40 mg total) into the skin daily.     furosemide 80 MG tablet  Commonly known as:  LASIX  Take 0.5 tablets (40 mg total) by mouth 2 (two) times daily.     hydrALAZINE 100 MG tablet  Commonly known as:  APRESOLINE  TAKE 1  TABLET (100 MG TOTAL) BY MOUTH 3 (THREE) TIMES DAILY.     isosorbide mononitrate 60 MG 24 hr tablet  Commonly known as:  IMDUR  TAKE 1.5 TABLETS BY MOUTH EVERY DAY     methocarbamol 500 MG tablet  Commonly known as:  ROBAXIN  Take 2 tablets (1,000 mg total) by mouth every 6 (six) hours as needed for muscle spasms.     oxyCODONE 5 MG immediate release tablet  Commonly known as:  Oxy IR/ROXICODONE  Take 1-2 tablets (5-10 mg total) by mouth every 3 (three) hours as needed for breakthrough pain.     pantoprazole 20 MG tablet  Commonly known as:  PROTONIX  Take 20 mg by mouth daily as needed for heartburn.     pravastatin 40 MG tablet  Commonly known as:  PRAVACHOL  TAKE 1 TABLET (40 MG TOTAL) BY MOUTH DAILY.     predniSONE 10 MG tablet  Commonly known as:  DELTASONE  Take 1 tablet (10 mg total) by mouth daily.     PROBIOTIC DAILY PO  Take 1 capsule by mouth daily.      senna-docusate 8.6-50 MG per tablet  Commonly known as:  Senokot-S  Take 1 tablet by mouth at bedtime as needed for mild constipation.     sirolimus 1 MG/ML solution  Commonly known as:  RAPAMUNE  Take 1.2 mg by mouth every morning.     traMADol 50 MG tablet  Commonly known as:  ULTRAM  Take 1 tablet (50 mg total) by mouth every 6 (six) hours as needed (pain). As needed for pain     Vitamin D-3 5000 UNITS Tabs  Take 5,000 Units by mouth daily.      ASK your doctor about these medications        amLODipine 2.5 MG tablet  Commonly known as:  NORVASC  Take 1 tablet (2.5 mg total) by mouth daily.     carvedilol 25 MG tablet  Commonly known as:  COREG  TAKE 1 TABLET TWICE DAILY WITH A MEAL     KLOR-CON M20 20 MEQ tablet  Generic drug:  potassium chloride SA  TAKE 2 TABLET twice daily     metolazone 2.5 MG tablet  Commonly known as:  ZAROXOLYN  TAKE 1 TABLET BY MOUTH 30 MINUTES BEFORE YOUR MORNING LASIX ON TUES, THURS, AND SAT MORNINGS       Follow-up Information    Follow up with HANDY,MICHAEL H, MD. Schedule an appointment as soon as possible for a visit in 10 days.   Specialty:  Orthopedic Surgery   Why:  For wound re-check   Contact information:   [address] [place] [address] [phone]       Follow up with Cliffton Asters, MD. Schedule an appointment as soon as possible for a visit in 3 weeks.   Specialty:  Infectious Diseases   Contact information:   [address] [place] [address] [phone]       Follow up with [name], MD. Schedule an appointment as soon as possible for a visit in 1 week.   Specialty:  Nephrology   Contact information:   [address] [place] [address] [phone]       Discharge Instructions and Plan: 1. Chronic nonunion and osteomyelitis L distal femur s/p ROH and excisional debridement             NWB L leg  Knee ROM ok             PT/OT             Dressing  changes every other day to left thigh, lower leg and foot   Adaptic, 4 x 4's, Kerlix and Ace wrap to foot and lower leg. Mepilex or 4 x 4's and tape to the left thigh             Ice and elevate as needed                             ID following- pt switched to augmentin- duration per infectious disease service             operative cultures- negative               Gram stains did not show any organisms   2. Medical issues- CKD, Hep C, CHF,                            Home meds                         Restart ASA today                            Renal following for Acute on chronic renal disease                           Cr and BUN trending back down              SBP has been elevated for last 24 hours- defer to medicine/Renal regarding restarting meds. Reviewed antihypertensives nursing facility   Patient mildly hypokalemic- held sevelamer at discharge as this was added during his inpatient stay. Also held his supplemental potassium. Recommend renal function panel in one or 2 days.                                        Vitamin D levels are good and PTH levels look good for this particular pt   3. ABL anemia/anemia of chronic disease              improved             Continue to monitor    Recommend checking CBC in 2 days                        4. DVT/PE prophylaxis             Lovenox  for another 14 days               5. FEN              as tolerated, heart healthy  6. Pain               Continue with current pain regimen- OxyIR and Ultram  7. Dispo              PT/OT at nursing facility for but a chair mobilization. Patient very deconditioned and likely unable to ambulate any significant distance bearing full weight on his right leg.  Follow-up with  orthopedics in 10 days  Follow-up with infectious disease and 3 weeks  Follow-up with renal physician in 7-10 days as well  Appointments will need to be made for all of these specialists.    Signed:  Mearl Latin,  PA-C [facility] [phone] (P) 03/22/2014, 10:05 AM

## 2014-03-23 LAB — SIROLIMUS LEVEL: Sirolimus (Rapamycin): 3.9 mcg/L (ref 3.0–18.0)

## 2014-03-24 LAB — CULTURE, BLOOD (ROUTINE X 2): CULTURE: NO GROWTH

## 2014-03-29 ENCOUNTER — Encounter (HOSPITAL_COMMUNITY): Payer: Self-pay | Admitting: Physical Medicine and Rehabilitation

## 2014-03-29 ENCOUNTER — Emergency Department (HOSPITAL_COMMUNITY)
Admission: EM | Admit: 2014-03-29 | Discharge: 2014-03-29 | Disposition: A | Discharge status: Home / Self Care | Payer: Medicare HMO | Attending: Emergency Medicine | Admitting: Emergency Medicine

## 2014-03-29 DIAGNOSIS — K219 Gastro-esophageal reflux disease without esophagitis: Secondary | ICD-10-CM | POA: Diagnosis not present

## 2014-03-29 DIAGNOSIS — Z8619 Personal history of other infectious and parasitic diseases: Secondary | ICD-10-CM | POA: Diagnosis not present

## 2014-03-29 DIAGNOSIS — Z8701 Personal history of pneumonia (recurrent): Secondary | ICD-10-CM | POA: Diagnosis not present

## 2014-03-29 DIAGNOSIS — I129 Hypertensive chronic kidney disease with stage 1 through stage 4 chronic kidney disease, or unspecified chronic kidney disease: Secondary | ICD-10-CM | POA: Insufficient documentation

## 2014-03-29 DIAGNOSIS — N184 Chronic kidney disease, stage 4 (severe): Secondary | ICD-10-CM | POA: Insufficient documentation

## 2014-03-29 DIAGNOSIS — Z79899 Other long term (current) drug therapy: Secondary | ICD-10-CM | POA: Insufficient documentation

## 2014-03-29 DIAGNOSIS — Z9889 Other specified postprocedural states: Secondary | ICD-10-CM | POA: Diagnosis not present

## 2014-03-29 DIAGNOSIS — I5020 Unspecified systolic (congestive) heart failure: Secondary | ICD-10-CM | POA: Diagnosis not present

## 2014-03-29 DIAGNOSIS — Y92129 Unspecified place in nursing home as the place of occurrence of the external cause: Secondary | ICD-10-CM | POA: Diagnosis not present

## 2014-03-29 DIAGNOSIS — Y998 Other external cause status: Secondary | ICD-10-CM | POA: Diagnosis not present

## 2014-03-29 DIAGNOSIS — E785 Hyperlipidemia, unspecified: Secondary | ICD-10-CM | POA: Diagnosis not present

## 2014-03-29 DIAGNOSIS — I4892 Unspecified atrial flutter: Secondary | ICD-10-CM | POA: Diagnosis not present

## 2014-03-29 DIAGNOSIS — Z87442 Personal history of urinary calculi: Secondary | ICD-10-CM | POA: Insufficient documentation

## 2014-03-29 DIAGNOSIS — M6281 Muscle weakness (generalized): Secondary | ICD-10-CM | POA: Diagnosis not present

## 2014-03-29 DIAGNOSIS — Z86010 Personal history of colonic polyps: Secondary | ICD-10-CM | POA: Insufficient documentation

## 2014-03-29 DIAGNOSIS — Z8709 Personal history of other diseases of the respiratory system: Secondary | ICD-10-CM | POA: Diagnosis not present

## 2014-03-29 DIAGNOSIS — Y939 Activity, unspecified: Secondary | ICD-10-CM | POA: Insufficient documentation

## 2014-03-29 DIAGNOSIS — T148XXA Other injury of unspecified body region, initial encounter: Secondary | ICD-10-CM

## 2014-03-29 DIAGNOSIS — S81802A Unspecified open wound, left lower leg, initial encounter: Secondary | ICD-10-CM | POA: Diagnosis present

## 2014-03-29 DIAGNOSIS — X58XXXA Exposure to other specified factors, initial encounter: Secondary | ICD-10-CM | POA: Insufficient documentation

## 2014-03-29 DIAGNOSIS — Z7982 Long term (current) use of aspirin: Secondary | ICD-10-CM | POA: Diagnosis not present

## 2014-03-29 DIAGNOSIS — M109 Gout, unspecified: Secondary | ICD-10-CM | POA: Insufficient documentation

## 2014-03-29 LAB — CBC WITH DIFFERENTIAL/PLATELET
BASOS ABS: 0 10*3/uL (ref 0.0–0.1)
Basophils Relative: 0 % (ref 0–1)
EOS PCT: 1 % (ref 0–5)
Eosinophils Absolute: 0.2 10*3/uL (ref 0.0–0.7)
HCT: 29.7 % — ABNORMAL LOW (ref 39.0–52.0)
Hemoglobin: 9.2 g/dL — ABNORMAL LOW (ref 13.0–17.0)
LYMPHS ABS: 0.9 10*3/uL (ref 0.7–4.0)
LYMPHS PCT: 6 % — AB (ref 12–46)
MCH: 29 pg (ref 26.0–34.0)
MCHC: 31 g/dL (ref 30.0–36.0)
MCV: 93.7 fL (ref 78.0–100.0)
Monocytes Absolute: 1.6 10*3/uL — ABNORMAL HIGH (ref 0.1–1.0)
Monocytes Relative: 11 % (ref 3–12)
Neutro Abs: 12.1 10*3/uL — ABNORMAL HIGH (ref 1.7–7.7)
Neutrophils Relative %: 82 % — ABNORMAL HIGH (ref 43–77)
Platelets: 264 10*3/uL (ref 150–400)
RBC: 3.17 MIL/uL — ABNORMAL LOW (ref 4.22–5.81)
RDW: 17.6 % — ABNORMAL HIGH (ref 11.5–15.5)
WBC: 14.8 10*3/uL — AB (ref 4.0–10.5)

## 2014-03-29 LAB — BASIC METABOLIC PANEL
Anion gap: 12 (ref 5–15)
BUN: 122 mg/dL — AB (ref 6–23)
CO2: 28 mmol/L (ref 19–32)
CREATININE: 5.23 mg/dL — AB (ref 0.50–1.35)
Calcium: 9.2 mg/dL (ref 8.4–10.5)
Chloride: 98 mmol/L (ref 96–112)
GFR calc non Af Amer: 11 mL/min — ABNORMAL LOW (ref >90)
GFR, EST AFRICAN AMERICAN: 13 mL/min — AB (ref >90)
GLUCOSE: 107 mg/dL — AB (ref 70–99)
Potassium: 4.1 mmol/L (ref 3.5–5.1)
Sodium: 138 mmol/L (ref 135–145)

## 2014-03-29 LAB — PROTIME-INR
INR: 1.12 (ref 0.00–1.49)
Prothrombin Time: 14.5 seconds (ref 11.6–15.2)

## 2014-03-29 MED ORDER — MORPHINE SULFATE 4 MG/ML IJ SOLN
4.0000 mg | Freq: Once | INTRAMUSCULAR | Status: AC
  Administered 2014-03-29: 4 mg via INTRAVENOUS
  Filled 2014-03-29: qty 1

## 2014-03-29 NOTE — ED Provider Notes (Addendum)
CSN: [id number]     Arrival date & time 03/29/14  1020 History   First MD Initiated Contact with Patient 03/29/14 1022     Chief Complaint  Patient presents with  . Leg Pain  . Bleeding/Bruising    HPI Patient presents to the emergency room for evaluation of bleeding from his left leg.  The patient has multiple medical problems which are listed below and his past medical history. The patient recently was in the hospital and has surgery for a femur fracture. While in the hospital the patient states he almost fell out of the bed and someone had to catch him. When they did that he sustained a laceration to the thin skin on his lower leg. They've been treating that with a pressure dressing.  The patient is recovering at a nursing facility.  This morning when he noticed a large amount of blood on the sheets. There were clots around his left lower extremity. The patient also is having some complaints of generalized weakness but no other complaints or concerns. Past Medical History  Diagnosis Date  . CKD (chronic kidney disease), stage IV     a. s/p renal transplant 1996.  Marland Kitchen HLD (hyperlipidemia)     takes Pravastatin daily  . Nonischemic cardiomyopathy     a. unknown etiology, EF 30-35% by echo 09/2011;  b. 09/2011 Lexi MV EF 42%, no ischemia/infarct.  . Systolic CHF     takes Lasix daily  . Atrial flutter     takes Amiodarone daily  . Gout     takes Allopurinol daily  . Hypertension     takes Amlodipine,Hydralazine,Imdur,and Coreg daily  . GERD (gastroesophageal reflux disease)     takes Protonix daily  . Shortness of breath dyspnea     exertion  . Pneumonia 2014  . History of bronchitis     several yrs ago  . Joint pain     right shoulder and left knee  . Sciatica   . Hepatitis C 1996    Hep C  . History of colon polyps   . History of kidney stones   . Urinary frequency   . History of shingles   . Hyperparathyroidism due to renal insufficiency    Past Surgical History  Procedure  Laterality Date  . Nephrectomy transplanted organ Right 39yrs ago  . Insertion of dialysis catheter  06/20/2011    Procedure: INSERTION OF DIALYSIS CATHETER;  Surgeon: [name], MD;  Location: [facility] OR;  Service: Vascular;  Laterality: Right;  Ultrasound guided insertion of right internal jugular dialysis catheter  . Multiple extractions with alveoloplasty  06/27/2011    Procedure: MULTIPLE EXTRACION WITH ALVEOLOPLASTY;  Surgeon: [name], DDS;  Location: [facility] OR;  Service: Oral Surgery;  Laterality: N/A;  Extraction  of tooth # 14 with alveoloplasty  . Hip surgery Left     replacement  . Joint replacement      left hip  . Femur fracture surgery Left 07/09/2012    Dr Deno Etienne  . Femur im nail Left 07/10/2012    ORIF L distal femur;  Surgeon: Toni Arthurs, MD;  Location: [facility] OR;  Service: Orthopedics;  Laterality: Left;  . I&d extremity Left 08/24/2012    Procedure: LEFT LEG IRRIGATION AND DEBRIDEMENT WITH POSSIBLE WOUND VAC APPLICATION;  Surgeon: Toni Arthurs, MD;  Location: [facility] OR;  Service: Orthopedics;  Laterality: Left;  . Tracheostomy  2014  . Tracheostomy closure  2014    2 wks after it was  done  . Colonsocopy    . Non union Left 03/14/2014    LEFT FEMER REPAIR     BY DR HANDY  . Hardware removal Left 03/14/2014    Procedure: HARDWARE REMOVAL LEFT;  Surgeon: [name], MD;  Location: [facility] OR;  Service: Orthopedics;  Laterality: Left;  . Wound debridement Left 03/14/2014    Procedure: DEBRIDEMENT WOUND;  Surgeon: [name], MD;  Location: [facility] OR;  Service: Orthopedics;  Laterality: Left;  INSERTION OF  ANTIBIOTIC Beads  (2 strands: 15 beads and 14 beads)   Family History  Problem Relation Age of Onset  . Heart disease Neg Hx    History  Substance Use Topics  . Smoking status: Never Smoker   . Smokeless tobacco: Never Used  . Alcohol Use: No    Review of Systems  All other systems reviewed and are negative.     Allergies  Review of patient's allergies indicates no  known allergies.  Home Medications   Prior to Admission medications   Medication Sig Start Date End Date Taking? Authorizing Provider  acetaminophen (TYLENOL) 325 MG tablet Take 2 tablets (650 mg total) by mouth every 6 (six) hours as needed for moderate pain or fever. 03/22/14   Mearl Latin, PA-C  allopurinol (ZYLOPRIM) 100 MG tablet Take 1 tablet (100 mg total) by mouth 2 (two) times daily. 08/05/12   Mcarthur Rossetti Angiulli, PA-C  amiodarone (PACERONE) 100 MG tablet TAKE 1 TABLET BY MOUTH EVERY DAY 09/19/13   Laurey Morale, MD  amLODipine (NORVASC) 2.5 MG tablet Take 1 tablet (2.5 mg total) by mouth daily. 09/19/13   Laurey Morale, MD  amoxicillin-clavulanate (AUGMENTIN) 500-125 MG per tablet Take 1 tablet (500 mg total) by mouth daily. 03/22/14   Mearl Latin, PA-C  aspirin 325 MG tablet Take 325 mg by mouth daily.    Historical Provider, MD  calcitRIOL (ROCALTROL) 0.5 MCG capsule Take 0.5 mcg by mouth daily. 02/02/14   Historical Provider, MD  carvedilol (COREG) 25 MG tablet TAKE 1 TABLET TWICE DAILY WITH A MEAL 02/27/14   Dolores Patty, MD  Cholecalciferol (VITAMIN D-3) 5000 UNITS TABS Take 5,000 Units by mouth daily.     Historical Provider, MD  docusate sodium (COLACE) 100 MG capsule Take 1 capsule (100 mg total) by mouth 2 (two) times daily. 03/22/14   Mearl Latin, PA-C  enoxaparin (LOVENOX) 40 MG/0.4ML injection Inject 0.4 mLs (40 mg total) into the skin daily. 03/22/14   Mearl Latin, PA-C  furosemide (LASIX) 80 MG tablet Take 0.5 tablets (40 mg total) by mouth 2 (two) times daily. 03/22/14   Mearl Latin, PA-C  hydrALAZINE (APRESOLINE) 100 MG tablet TAKE 1 TABLET (100 MG TOTAL) BY MOUTH 3 (THREE) TIMES DAILY. 02/14/14   Amy D Filbert Schilder, NP  isosorbide mononitrate (IMDUR) 60 MG 24 hr tablet TAKE 1.5 TABLETS BY MOUTH EVERY DAY 03/02/14   Dolores Patty, MD  methocarbamol (ROBAXIN) 500 MG tablet Take 2 tablets (1,000 mg total) by mouth every 6 (six) hours as needed for muscle spasms. 03/22/14    Mearl Latin, PA-C  metolazone (ZAROXOLYN) 2.5 MG tablet TAKE 1 TABLET BY MOUTH 30 MINUTES BEFORE YOUR MORNING LASIX ON TUES, THURS, AND SAT MORNINGS 03/02/14   Laurey Morale, MD  oxyCODONE (OXY IR/ROXICODONE) 5 MG immediate release tablet Take 1-2 tablets (5-10 mg total) by mouth every 3 (three) hours as needed for breakthrough pain. 03/22/14   Mearl Latin,  PA-C  pantoprazole (PROTONIX) 20 MG tablet Take 20 mg by mouth daily as needed for heartburn.     Historical Provider, MD  potassium chloride SA (KLOR-CON M20) 20 MEQ tablet TAKE 2 TABLET twice daily 02/28/13   Laurey Morale, MD  pravastatin (PRAVACHOL) 40 MG tablet TAKE 1 TABLET (40 MG TOTAL) BY MOUTH DAILY.    Laurey Morale, MD  predniSONE (DELTASONE) 10 MG tablet Take 1 tablet (10 mg total) by mouth daily. 08/05/12   Mcarthur Rossetti Angiulli, PA-C  Probiotic Product (PROBIOTIC DAILY PO) Take 1 capsule by mouth daily.     Historical Provider, MD  senna-docusate (SENOKOT-S) 8.6-50 MG per tablet Take 1 tablet by mouth at bedtime as needed for mild constipation. 03/22/14   Mearl Latin, PA-C  sirolimus (RAPAMUNE) 1 MG/ML solution Take 1.2 mg by mouth every morning.     Historical Provider, MD  traMADol (ULTRAM) 50 MG tablet Take 1 tablet (50 mg total) by mouth every 6 (six) hours as needed (pain). As needed for pain 03/22/14   Mearl Latin, PA-C   BP 105/64 mmHg  Pulse 81  Temp(Src) 99.5 F (37.5 C) (Oral)  Resp 20  SpO2 88% Physical Exam  Constitutional: No distress.  Morbidly obese  HENT:  Head: Normocephalic and atraumatic.  Right Ear: External ear normal.  Left Ear: External ear normal.  Eyes: Conjunctivae are normal. Right eye exhibits no discharge. Left eye exhibits no discharge. No scleral icterus.  Conjunctiva panel  Neck: Neck supple. No tracheal deviation present.  Cardiovascular: Normal rate, regular rhythm and intact distal pulses.   Pulmonary/Chest: Effort normal and breath sounds normal. No stridor. No respiratory distress. He  has no wheezes. He has no rales.  Abdominal: Soft. Bowel sounds are normal. He exhibits no distension. There is no tenderness. There is no rebound and no guarding.  Musculoskeletal: He exhibits edema. He exhibits no tenderness.  Patient has bilateral lower extremity edema, his left hip and femur surgical wound is without any evidence of bleeding, there is no erythema or drainage; the patient does have superficial wounds in the left lower extremity below the knee to the anterior and lateral aspect of his lower leg; there is 1 ox 0.2 cm Chevron shaped laceration that does have a small amount of blood trickling from the lateral aspect,   Neurological: He is alert. He has normal strength. No cranial nerve deficit (no facial droop, extraocular movements intact, no slurred speech) or sensory deficit. He exhibits normal muscle tone. He displays no seizure activity. Coordination normal.  Skin: Skin is warm and dry. No rash noted.  Psychiatric: He has a normal mood and affect.  Nursing note and vitals reviewed.   ED Course  Procedures (including critical care time) Labs Review Labs Reviewed  CBC WITH DIFFERENTIAL/PLATELET - Abnormal; Notable for the following:    WBC 14.8 (*)    RBC 3.17 (*)    Hemoglobin 9.2 (*)    HCT 29.7 (*)    RDW 17.6 (*)    Neutrophils Relative % 82 (*)    Neutro Abs 12.1 (*)    Lymphocytes Relative 6 (*)    Monocytes Absolute 1.6 (*)    All other components within normal limits  BASIC METABOLIC PANEL - Abnormal; Notable for the following:    Glucose, Bld 107 (*)    BUN 122 (*)    Creatinine, Ser 5.23 (*)    GFR calc non Af Amer 11 (*)    GFR calc  Af Amer 13 (*)    All other components within normal limits  PROTIME-INR     MDM   Final diagnoses:  Bleeding from wound    Patient has a small superficial wound that appears to be the source of his blood loss. I we'll check labs including coagulation panel. Combat dressing gauze was applied to the wound we will  continue to monitor  Bleeding controlled with dressing.  Labs are stable including his renal insufficiency and anemia  Will dc back to nursing facility.    Linwood Dibbles, MD 03/29/14 1218  Wounds were re-examined prior to discharge.  No further bleeding from the wound but he does have some oozing noted around the old scabs.  Pt has significant lower extremity edema and the oozing was distal to the area of the compression dressing.  Will apply combat guaze to all wound below the knee.  Ace wrap loose pressure from the foot to the Patrici Ranks, MD 03/29/14 1247

## 2014-03-29 NOTE — ED Notes (Addendum)
Pt presents to department via [facility] from [facility] for evaluation of L lower leg bleeding. States he had recent L hip surgery, pt states open wound to L lower leg, staff reports blood soaked dressing with large clots this morning. Pt able to wiggle digits upon arrival, sensation intact. 8/10 pain to L hip and lower leg. Pt is alert and oriented x4.

## 2014-03-29 NOTE — ED Notes (Addendum)
V-shaped skin tear noted to upper L leg. Bleeding upon arrival. Combat gauze placed over wound, pressure dressing also applied. Pt tolerated without difficulty. Pedal pulses present to L foot, sensation and movement intact.

## 2014-03-29 NOTE — ED Notes (Signed)
Quick clot placed on wounds on left lateral lower leg, gauze and ace wrap applied from distal foot to knee.  Pt tolerated well, +cms noted to distal toes

## 2014-04-03 ENCOUNTER — Telehealth: Payer: Self-pay | Admitting: *Deleted

## 2014-04-03 ENCOUNTER — Encounter: Payer: Self-pay | Admitting: Internal Medicine

## 2014-04-03 ENCOUNTER — Telehealth (HOSPITAL_COMMUNITY): Payer: Self-pay | Admitting: Vascular Surgery

## 2014-04-03 ENCOUNTER — Ambulatory Visit (INDEPENDENT_AMBULATORY_CARE_PROVIDER_SITE_OTHER): Payer: Medicare HMO | Admitting: Internal Medicine

## 2014-04-03 ENCOUNTER — Other Ambulatory Visit: Payer: Self-pay | Admitting: Internal Medicine

## 2014-04-03 DIAGNOSIS — B182 Chronic viral hepatitis C: Secondary | ICD-10-CM

## 2014-04-03 DIAGNOSIS — M869 Osteomyelitis, unspecified: Secondary | ICD-10-CM

## 2014-04-03 LAB — COMPREHENSIVE METABOLIC PANEL
ALBUMIN: 2.5 g/dL — AB (ref 3.5–5.2)
ALT: <8 U/L (ref 0–53)
AST: 29 U/L (ref 0–37)
Alkaline Phosphatase: 48 U/L (ref 39–117)
BILIRUBIN TOTAL: 0.7 mg/dL (ref 0.2–1.2)
BUN: 122 mg/dL — ABNORMAL HIGH (ref 6–23)
CO2: 25 meq/L (ref 19–32)
Calcium: 8.4 mg/dL (ref 8.4–10.5)
Chloride: 92 mEq/L — ABNORMAL LOW (ref 96–112)
Creat: 5.5 mg/dL — ABNORMAL HIGH (ref 0.50–1.35)
GLUCOSE: 86 mg/dL (ref 70–99)
POTASSIUM: 4 meq/L (ref 3.5–5.3)
Sodium: 133 mEq/L — ABNORMAL LOW (ref 135–145)
Total Protein: 6.6 g/dL (ref 6.0–8.3)

## 2014-04-03 LAB — SEDIMENTATION RATE: Sed Rate: >130 mm/hr — ABNORMAL HIGH (ref 0–20)

## 2014-04-03 LAB — IRON: IRON: 27 ug/dL — AB (ref 42–165)

## 2014-04-03 LAB — C-REACTIVE PROTEIN: CRP: 10.2 mg/dL — ABNORMAL HIGH (ref <0.60)

## 2014-04-03 LAB — PROTIME-INR
INR: 1.17 (ref <1.50)
PROTHROMBIN TIME: 14.9 s (ref 11.6–15.2)

## 2014-04-03 NOTE — Addendum Note (Signed)
Addended by: Mariea Clonts D on: 04/03/2014 12:04 PM   Modules accepted: Orders

## 2014-04-03 NOTE — Addendum Note (Signed)
Addended by: Jennet Maduro D on: 04/03/2014 12:16 PM   Modules accepted: Orders

## 2014-04-03 NOTE — Progress Notes (Signed)
Patient ID: Chad [name], male   DOB: September 01, 1955, 59 y.o.   MRN: [id number]         [facility] for [facility]ious Disease  Patient Active Problem List   Diagnosis Date Noted  . Skin lesion of right leg 06/27/2013    Priority: High  . Postoperative wound infection 08/25/2012    Priority: High  . Diarrhea 03/16/2012    Priority: Medium  . 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 Gout 03/22/2014  . Hepatitis C 03/22/2014  . HLD (hyperlipidemia)   . Hyperparathyroidism due to renal insufficiency   . Osteomyelitis 03/14/2014  . Broken femur   . Femur fracture, left 07/16/2012  . Fracture, femur, distal 07/10/2012  . Nausea and vomiting 03/13/2012  . Hx of amiodarone therapy 03/13/2012  . Long term current use of systemic steroids 03/13/2012  . Pulmonary hypertension 03/13/2012  . CHF (congestive heart failure) 12/08/2011  . Community acquired pneumonia 12/08/2011  . PAF (paroxysmal atrial fibrillation) 12/08/2011  . History of renal transplant 12/08/2011  . HTN (hypertension) 12/08/2011  . Respiratory failure with hypoxia 12/08/2011  . CKD (chronic kidney disease) 11/18/2011  . Hypertension 11/18/2011  . OSA (obstructive sleep apnea) 11/13/2011  . Chronic systolic CHF (congestive heart failure) 10/22/2011  . Acute on chronic systolic heart failure 10/03/2011  . Atrial flutter 08/27/2011  . Ulcer of left lower leg 08/27/2011  . Conjunctivitis, acute, left eye 08/27/2011  . 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 Septic shock 06/10/2011  . Acute renal failure 06/10/2011  . Cardiomyopathy 06/10/2011  . Acute systolic heart failure 06/09/2011  . Renal transplant disorder 06/05/2011  . HCAP (healthcare-associated pneumonia) 06/04/2011  . Thrombocytopenia 06/04/2011  . Acute respiratory failure 06/04/2011  . ARDS (adult respiratory distress syndrome) 06/04/2011    Patient's Medications  New Prescriptions   No medications on  file  Previous Medications   ACETAMINOPHEN (TYLENOL) 325 MG TABLET    Take 2 tablets (650 mg total) by mouth every 6 (six) hours as needed for moderate pain or fever.   ALLOPURINOL (ZYLOPRIM) 100 MG TABLET    Take 1 tablet (100 mg total) by mouth 2 (two) times daily.   AMIODARONE (PACERONE) 100 MG TABLET    TAKE 1 TABLET BY MOUTH EVERY DAY   AMIODARONE (PACERONE) 200 MG TABLET    Take 200 mg by mouth daily.   AMLODIPINE (NORVASC) 2.5 MG TABLET    Take 1 tablet (2.5 mg total) by mouth daily.   AMOXICILLIN-CLAVULANATE (AUGMENTIN) 500-125 MG PER TABLET    Take 1 tablet (500 mg total) by mouth daily.   ASPIRIN 325 MG TABLET    Take 325 mg by mouth daily.   CALCITRIOL (ROCALTROL) 0.5 MCG CAPSULE    Take 0.5 mcg by mouth daily.   CARVEDILOL (COREG) 25 MG TABLET    TAKE 1 TABLET TWICE DAILY WITH A MEAL   CHOLECALCIFEROL (VITAMIN D-3) 5000 UNITS TABS    Take 5,000 Units by mouth daily.    DOCUSATE SODIUM (COLACE) 100 MG CAPSULE    Take 1 capsule (100 mg total) by mouth 2 (two) times daily.   ENOXAPARIN (LOVENOX) 40 MG/0.4ML INJECTION    Inject 0.4 mLs (40 mg total) into the skin daily.   FUROSEMIDE (LASIX) 80 MG TABLET    Take 0.5 tablets (40 mg total) by mouth 2 (two) times daily.   HYDRALAZINE (APRESOLINE) 100 MG TABLET    TAKE 1 TABLET (100 MG TOTAL) BY  MOUTH 3 (THREE) TIMES DAILY.   ISOSORBIDE MONONITRATE (IMDUR) 60 MG 24 HR TABLET    TAKE 1.5 TABLETS BY MOUTH EVERY DAY   METHOCARBAMOL (ROBAXIN) 500 MG TABLET    Take 2 tablets (1,000 mg total) by mouth every 6 (six) hours as needed for muscle spasms.   METOLAZONE (ZAROXOLYN) 2.5 MG TABLET    TAKE 1 TABLET BY MOUTH 30 MINUTES BEFORE YOUR MORNING LASIX ON TUES, THURS, AND SAT MORNINGS   OXYCODONE (OXY IR/ROXICODONE) 5 MG IMMEDIATE RELEASE TABLET    Take 1-2 tablets (5-10 mg total) by mouth every 3 (three) hours as needed for breakthrough pain.   PANTOPRAZOLE (PROTONIX) 20 MG TABLET    Take 20 mg by mouth daily as needed for heartburn.    POTASSIUM  CHLORIDE SA (KLOR-CON M20) 20 MEQ TABLET    TAKE 2 TABLET twice daily   PRAVASTATIN (PRAVACHOL) 40 MG TABLET    TAKE 1 TABLET (40 MG TOTAL) BY MOUTH DAILY.   PREDNISONE (DELTASONE) 10 MG TABLET    Take 1 tablet (10 mg total) by mouth daily.   PROBIOTIC PRODUCT (PROBIOTIC DAILY PO)    Take 1 capsule by mouth daily.    SENNA-DOCUSATE (SENOKOT-S) 8.6-50 MG PER TABLET    Take 1 tablet by mouth at bedtime as needed for mild constipation.   SIROLIMUS (RAPAMUNE) 1 MG/ML SOLUTION    Take 1.2 mg by mouth every morning.    TRAMADOL (ULTRAM) 50 MG TABLET    Take 1 tablet (50 mg total) by mouth every 6 (six) hours as needed (pain). As needed for pain  Modified Medications   No medications on file  Discontinued Medications   No medications on file    Subjective: [name] fell and sustained a distal left femur fracture in June 2014. He underwent open reduction and internal fixation with a plate and had a prolonged hospitalization for inpatient rehabilitation. He had some persistent thin drainage from one area of the wound swab cultures of wound drainage in late July 2014 grew a very sensitive Proteus and enterococcus that may have represented insignificant skin contaminants. He was started on empiric vancomycin and piperacillin tazobactam and had I and D. A large seroma was encountered that tracked down to the plate. There was also an area that looked like old hematoma lateral to the knee but not involving the joint. Operative Gram stain showed no organisms and cultures showed no growth. He was discharged on IV vancomycin and meropenem and then transitioned to chronic oral amoxicillin clavulanate. He was re-admitted last month with chronic nonunion of the fracture site and underwent further incision and drainage with hardware removal and antibiotic bead placement. All operative stains and cultures were negative. He was discharged on oral amoxicillin clavulanate.  He has a history of renal transplant and is on  immunosuppressive therapy. He has had slowly worsening renal function recently. He also has a history of chronic hepatitis C. He was [facility] at [facility] by Dr. Sharlene Motts in 2013 and was considered not to be a candidate for pegylated interferon and ribavirin therapy. Liver biopsy in 2011 showed "stage I fibrosis and grade 2 inflammation. Minimal steatosis is noted with mild ballooning degeneration." Dr. Luster Landsberg recommended holding off on therapy and waiting for newer agents to be approved.   Review of Systems: Pertinent items are noted in HPI.  Past Medical History  Diagnosis Date  . CKD (chronic kidney disease), stage IV     a. s/p renal transplant 1996.  Marland Kitchen  HLD (hyperlipidemia)     takes Pravastatin daily  . Nonischemic cardiomyopathy     a. unknown etiology, EF 30-35% by echo 09/2011;  b. 09/2011 Lexi MV EF 42%, no ischemia/infarct.  . Systolic CHF     takes Lasix daily  . Atrial flutter     takes Amiodarone daily  . Gout     takes Allopurinol daily  . Hypertension     takes Amlodipine,Hydralazine,Imdur,and Coreg daily  . GERD (gastroesophageal reflux disease)     takes Protonix daily  . Shortness of breath dyspnea     exertion  . Pneumonia 2014  . History of bronchitis     several yrs ago  . Joint pain     right shoulder and left knee  . Sciatica   . Hepatitis C 1996    Hep C  . History of colon polyps   . History of kidney stones   . Urinary frequency   . History of shingles   . Hyperparathyroidism due to renal insufficiency     History  Substance Use Topics  . Smoking status: Never Smoker   . Smokeless tobacco: Never Used  . Alcohol Use: No    Family History  Problem Relation Age of Onset  . Heart disease Neg Hx     No Known Allergies  Objective:    General: he is comfortable seated in his wheelchair. Abdomen: obese, soft and nontender. Liver is not palpable Extremities: bilateral lower extremity edema. There is a small amount of bloody  drainage on his left thigh dressing   Assessment: I will continue him on renally adjusted amoxicillin clavulanate for now. I will check renal function, sedimentation rate and C-reactive protein and see him back next month.  I will also recheck his hepatitis C viral load and ultrasound with elastography and see him back next month to reconsider hepatitis C therapy.  Plan: 1. Continue amoxicillin clavulanate 2. Check CMP, hepatitis C viral load and ultrasound with elastography 3. Check sedimentation rate and C-reactive protein 4. Follow-up in one month   Cliffton Asters, MD [facility] for Infectious Disease [facility] [phone] pager   [phone] cell 04/03/2014, 9:07 AM

## 2014-04-03 NOTE — Addendum Note (Signed)
Addended by: Mariea Clonts D on: 04/03/2014 11:52 AM   Modules accepted: Orders

## 2014-04-03 NOTE — Telephone Encounter (Signed)
Call from [name] with [facility] requesting that lab results be faxed when they result to [phone]. [name]

## 2014-04-03 NOTE — Telephone Encounter (Signed)
Nurse from facility called pt told the doctor that he was on  Diuretic that name starts with a "T" that was the only medication that helped with his edema he wants to start again.. Nurse would like to speak to someone about the medication.. Please advise.Marland Kitchen

## 2014-04-03 NOTE — Telephone Encounter (Signed)
Spoke w/RN she states pt told them he was on a med along with his lasix to help with swelling, she states he is on lasix and legs are very edematous and starting to weep.  Advised last time we saw pt he was on lasix and metolazone 2.5 mg every Tue, Thur and Sat, she states he is not currently on the metolazone and that is probably what pt is referring to she will have their MD write an order, she will c/b if needs help with edema

## 2014-04-04 ENCOUNTER — Encounter (HOSPITAL_COMMUNITY): Payer: Self-pay | Admitting: Orthopedic Surgery

## 2014-04-04 LAB — HEPATITIS C RNA QUANTITATIVE
HCV QUANT: 3832046 [IU]/mL — AB (ref <15)
HCV Quantitative Log: 6.58 {Log} — ABNORMAL HIGH (ref <1.18)

## 2014-04-04 LAB — ANA: Anti Nuclear Antibody(ANA): NEGATIVE

## 2014-04-04 NOTE — OR Nursing (Signed)
Late entry, delay code documentation. 

## 2014-04-05 ENCOUNTER — Other Ambulatory Visit (HOSPITAL_COMMUNITY): Payer: Self-pay | Admitting: *Deleted

## 2014-04-06 ENCOUNTER — Encounter (HOSPITAL_COMMUNITY)
Admission: RE | Admit: 2014-04-06 | Discharge: 2014-04-06 | Disposition: A | Discharge status: Home / Self Care | Payer: Medicare HMO | Source: Ambulatory Visit | Attending: Nephrology | Admitting: Nephrology

## 2014-04-06 DIAGNOSIS — Z940 Kidney transplant status: Secondary | ICD-10-CM | POA: Insufficient documentation

## 2014-04-06 DIAGNOSIS — N184 Chronic kidney disease, stage 4 (severe): Secondary | ICD-10-CM | POA: Insufficient documentation

## 2014-04-06 LAB — LIVER FIBROSIS, FIBROTEST-ACTITEST
ALT: 6 U/L — ABNORMAL LOW (ref 9–46)
Alpha-2-Macroglobulin: 188 mg/dL (ref 106–279)
Apolipoprotein A1: 106 mg/dL (ref 94–176)
Bilirubin: 0.6 mg/dL (ref 0.2–1.2)
Fibrosis Score: 0.75
GGT: 30 U/L (ref 3–85)
NECROINFLAMMAT ACT SCORE: 0.02
Reference ID: 1157787

## 2014-04-06 MED ORDER — SODIUM CHLORIDE 0.9 % IV SOLN
510.0000 mg | INTRAVENOUS | Status: DC
  Administered 2014-04-06: 510 mg via INTRAVENOUS
  Filled 2014-04-06: qty 17

## 2014-04-06 NOTE — Discharge Instructions (Signed)
Epoetin Alfa injection °What is this medicine? °EPOETIN ALFA (e POE e tin AL fa) helps your body make more red blood cells. This medicine is used to treat anemia caused by chronic kidney failure, cancer chemotherapy, or HIV-therapy. It may also be used before surgery if you have anemia. °This medicine may be used for other purposes; ask your health care provider or pharmacist if you have questions. °COMMON BRAND NAME(S): Epogen, Procrit °What should I tell my health care provider before I take this medicine? °They need to know if you have any of these conditions: °-blood clotting disorders °-cancer patient not on chemotherapy °-cystic fibrosis °-heart disease, such as angina or heart failure °-hemoglobin level of 12 g/dL or greater °-high blood pressure °-low levels of folate, iron, or vitamin B12 °-seizures °-an unusual or allergic reaction to erythropoietin, albumin, benzyl alcohol, hamster proteins, other medicines, foods, dyes, or preservatives °-pregnant or trying to get pregnant °-breast-feeding °How should I use this medicine? °This medicine is for injection into a vein or under the skin. It is usually given by a health care professional in a hospital or clinic setting. °If you get this medicine at home, you will be taught how to prepare and give this medicine. Use exactly as directed. Take your medicine at regular intervals. Do not take your medicine more often than directed. °It is important that you put your used needles and syringes in a special sharps container. Do not put them in a trash can. If you do not have a sharps container, call your pharmacist or healthcare provider to get one. °Talk to your pediatrician regarding the use of this medicine in children. While this drug may be prescribed for selected conditions, precautions do apply. °Overdosage: If you think you have taken too much of this medicine contact a poison control center or emergency room at once. °NOTE: This medicine is only for you. Do  not share this medicine with others. °What if I miss a dose? °If you miss a dose, take it as soon as you can. If it is almost time for your next dose, take only that dose. Do not take double or extra doses. °What may interact with this medicine? °Do not take this medicine with any of the following medications: °-darbepoetin alfa °This list may not describe all possible interactions. Give your health care provider a list of all the medicines, herbs, non-prescription drugs, or dietary supplements you use. Also tell them if you smoke, drink alcohol, or use illegal drugs. Some items may interact with your medicine. °What should I watch for while using this medicine? °Visit your prescriber or health care professional for regular checks on your progress and for the needed blood tests and blood pressure measurements. It is especially important for the doctor to make sure your hemoglobin level is in the desired range, to limit the risk of potential side effects and to give you the best benefit. Keep all appointments for any recommended tests. Check your blood pressure as directed. Ask your doctor what your blood pressure should be and when you should contact him or her. °As your body makes more red blood cells, you may need to take iron, folic acid, or vitamin B supplements. Ask your doctor or health care provider which products are right for you. If you have kidney disease continue dietary restrictions, even though this medication can make you feel better. Talk with your doctor or health care professional about the foods you eat and the vitamins that you take. °What   side effects may I notice from receiving this medicine? °Side effects that you should report to your doctor or health care professional as soon as possible: °-allergic reactions like skin rash, itching or hives, swelling of the face, lips, or tongue °-breathing problems °-changes in vision °-chest pain °-confusion, trouble speaking or understanding °-feeling  faint or lightheaded, falls °-high blood pressure °-muscle aches or pains °-pain, swelling, warmth in the leg °-rapid weight gain °-severe headaches °-sudden numbness or weakness of the face, arm or leg °-trouble walking, dizziness, loss of balance or coordination °-seizures (convulsions) °-swelling of the ankles, feet, hands °-unusually weak or tired °Side effects that usually do not require medical attention (report to your doctor or health care professional if they continue or are bothersome): °-diarrhea °-fever, chills (flu-like symptoms) °-headaches °-nausea, vomiting °-redness, stinging, or swelling at site where injected °This list may not describe all possible side effects. Call your doctor for medical advice about side effects. You may report side effects to FDA at 1-800-FDA-1088. °Where should I keep my medicine? °Keep out of the reach of children. °Store in a refrigerator between 2 and 8 degrees C (36 and 46 degrees F). Do not freeze or shake. Throw away any unused portion if using a single-dose vial. Multi-dose vials can be kept in the refrigerator for up to 21 days after the initial dose. Throw away unused medicine. °NOTE: This sheet is a summary. It may not cover all possible information. If you have questions about this medicine, talk to your doctor, pharmacist, or health care provider. °© 2015, [employer]. (2007-12-28 10:25:44) °Ferumoxytol injection °What is this medicine? °FERUMOXYTOL is an iron complex. Iron is used to make healthy red blood cells, which carry oxygen and nutrients throughout the body. This medicine is used to treat iron deficiency anemia in people with chronic kidney disease. °This medicine may be used for other purposes; ask your health care provider or pharmacist if you have questions. °COMMON BRAND NAME(S): Feraheme °What should I tell my health care provider before I take this medicine? °They need to know if you have any of these conditions: °-anemia not caused by  low iron levels °-high levels of iron in the blood °-magnetic resonance imaging (MRI) test scheduled °-an unusual or allergic reaction to iron, other medicines, foods, dyes, or preservatives °-pregnant or trying to get pregnant °-breast-feeding °How should I use this medicine? °This medicine is for injection into a vein. It is given by a health care professional in a hospital or clinic setting. °Talk to your pediatrician regarding the use of this medicine in children. Special care may be needed. °Overdosage: If you think you've taken too much of this medicine contact a poison control center or emergency room at once. °Overdosage: If you think you have taken too much of this medicine contact a poison control center or emergency room at once. °NOTE: This medicine is only for you. Do not share this medicine with others. °What if I miss a dose? °It is important not to miss your dose. Call your doctor or health care professional if you are unable to keep an appointment. °What may interact with this medicine? °This medicine may interact with the following medications: °-other iron products °This list may not describe all possible interactions. Give your health care provider a list of all the medicines, herbs, non-prescription drugs, or dietary supplements you use. Also tell them if you smoke, drink alcohol, or use illegal drugs. Some items may interact with your medicine. °What should I   watch for while using this medicine? °Visit your doctor or healthcare professional regularly. Tell your doctor or healthcare professional if your symptoms do not start to get better or if they get worse. You may need blood work done while you are taking this medicine. °You may need to follow a special diet. Talk to your doctor. Foods that contain iron include: whole grains/cereals, dried fruits, beans, or peas, leafy green vegetables, and organ meats (liver, kidney). °What side effects may I notice from receiving this medicine? °Side  effects that you should report to your doctor or health care professional as soon as possible: °-allergic reactions like skin rash, itching or hives, swelling of the face, lips, or tongue °-breathing problems °-changes in blood pressure °-feeling faint or lightheaded, falls °-fever or chills °-flushing, sweating, or hot feelings °-swelling of the ankles or feet °Side effects that usually do not require medical attention (Report these to your doctor or health care professional if they continue or are bothersome.): °-diarrhea °-headache °-nausea, vomiting °-stomach pain °This list may not describe all possible side effects. Call your doctor for medical advice about side effects. You may report side effects to FDA at 1-800-FDA-1088. °Where should I keep my medicine? °This drug is given in a hospital or clinic and will not be stored at home. °NOTE: This sheet is a summary. It may not cover all possible information. If you have questions about this medicine, talk to your doctor, pharmacist, or health care provider. °© 2015, [employer] Standard. (2011-08-29 15:23:36) ° ° °

## 2014-04-06 NOTE — Telephone Encounter (Signed)
Results faxed to [facility].

## 2014-04-07 LAB — POCT HEMOGLOBIN-HEMACUE: HEMOGLOBIN: 10.3 g/dL — AB (ref 13.0–17.0)

## 2014-04-07 MED ORDER — EPOETIN ALFA 10000 UNIT/ML IJ SOLN
INTRAMUSCULAR | Status: AC
  Administered 2014-04-06: 10000 [IU] via SUBCUTANEOUS
  Filled 2014-04-07: qty 1

## 2014-04-14 ENCOUNTER — Other Ambulatory Visit (HOSPITAL_COMMUNITY): Payer: Self-pay | Admitting: *Deleted

## 2014-04-17 ENCOUNTER — Encounter (HOSPITAL_COMMUNITY)
Admission: RE | Admit: 2014-04-17 | Discharge: 2014-04-17 | Disposition: A | Discharge status: Home / Self Care | Payer: Medicare HMO | Source: Ambulatory Visit | Attending: Nephrology | Admitting: Nephrology

## 2014-04-17 DIAGNOSIS — N184 Chronic kidney disease, stage 4 (severe): Secondary | ICD-10-CM | POA: Diagnosis not present

## 2014-04-17 LAB — RENAL FUNCTION PANEL
ALBUMIN: 2.3 g/dL — AB (ref 3.5–5.2)
Anion gap: 17 — ABNORMAL HIGH (ref 5–15)
BUN: 133 mg/dL — AB (ref 6–23)
CO2: 35 mmol/L — ABNORMAL HIGH (ref 19–32)
Calcium: 9 mg/dL (ref 8.4–10.5)
Chloride: 79 mmol/L — ABNORMAL LOW (ref 96–112)
Creatinine, Ser: 5.65 mg/dL — ABNORMAL HIGH (ref 0.50–1.35)
GFR calc Af Amer: 12 mL/min — ABNORMAL LOW (ref >90)
GFR calc non Af Amer: 10 mL/min — ABNORMAL LOW (ref >90)
Glucose, Bld: 91 mg/dL (ref 70–99)
PHOSPHORUS: 5.4 mg/dL — AB (ref 2.3–4.6)
Potassium: 3 mmol/L — ABNORMAL LOW (ref 3.5–5.1)
Sodium: 131 mmol/L — ABNORMAL LOW (ref 135–145)

## 2014-04-17 LAB — POCT HEMOGLOBIN-HEMACUE: Hemoglobin: 9.7 g/dL — ABNORMAL LOW (ref 13.0–17.0)

## 2014-04-17 MED ORDER — EPOETIN ALFA 10000 UNIT/ML IJ SOLN
10000.0000 [IU] | INTRAMUSCULAR | Status: DC
  Administered 2014-04-17: 10000 [IU] via SUBCUTANEOUS

## 2014-04-17 MED ORDER — SODIUM CHLORIDE 0.9 % IV SOLN
510.0000 mg | Freq: Once | INTRAVENOUS | Status: AC
  Administered 2014-04-17: 510 mg via INTRAVENOUS
  Filled 2014-04-17: qty 17

## 2014-04-17 NOTE — Discharge Instructions (Signed)

## 2014-04-18 MED ORDER — EPOETIN ALFA 10000 UNIT/ML IJ SOLN
INTRAMUSCULAR | Status: AC
  Filled 2014-04-18: qty 1

## 2014-05-01 ENCOUNTER — Telehealth: Payer: Self-pay | Admitting: Cardiology

## 2014-05-01 ENCOUNTER — Encounter (HOSPITAL_COMMUNITY): Payer: Self-pay

## 2014-05-01 NOTE — Telephone Encounter (Signed)
Chad [name] pt's SO calling re pt getting out of rehab Sat and Amlodipine is off his med list, does he need to stop taking it?

## 2014-05-01 NOTE — Telephone Encounter (Signed)
Spoke w/Pat, advised per our records pt should still be on medications, she will have him restart the med

## 2014-05-03 ENCOUNTER — Other Ambulatory Visit: Payer: Self-pay | Admitting: *Deleted

## 2014-05-03 DIAGNOSIS — N184 Chronic kidney disease, stage 4 (severe): Secondary | ICD-10-CM

## 2014-05-03 DIAGNOSIS — Z01810 Encounter for preprocedural cardiovascular examination: Secondary | ICD-10-CM

## 2014-05-04 ENCOUNTER — Other Ambulatory Visit (HOSPITAL_COMMUNITY): Payer: Self-pay | Admitting: Cardiology

## 2014-05-04 ENCOUNTER — Ambulatory Visit: Payer: Self-pay | Admitting: Internal Medicine

## 2014-05-04 ENCOUNTER — Ambulatory Visit (HOSPITAL_COMMUNITY): Payer: Commercial Managed Care - HMO

## 2014-05-05 ENCOUNTER — Encounter (HOSPITAL_COMMUNITY): Payer: Self-pay

## 2014-05-05 ENCOUNTER — Other Ambulatory Visit (HOSPITAL_COMMUNITY): Payer: Self-pay

## 2014-05-05 ENCOUNTER — Ambulatory Visit: Payer: Self-pay | Admitting: Vascular Surgery

## 2014-05-21 ENCOUNTER — Other Ambulatory Visit (HOSPITAL_COMMUNITY): Payer: Self-pay | Admitting: Cardiology

## 2014-05-25 ENCOUNTER — Other Ambulatory Visit: Payer: Self-pay | Admitting: Internal Medicine

## 2014-05-25 DIAGNOSIS — M869 Osteomyelitis, unspecified: Secondary | ICD-10-CM

## 2014-05-29 ENCOUNTER — Encounter: Payer: Self-pay | Admitting: Vascular Surgery

## 2014-05-30 ENCOUNTER — Ambulatory Visit (HOSPITAL_COMMUNITY)
Admission: RE | Admit: 2014-05-30 | Discharge: 2014-05-30 | Disposition: A | Discharge status: Home / Self Care | Payer: Medicare HMO | Source: Ambulatory Visit | Attending: Vascular Surgery | Admitting: Vascular Surgery

## 2014-05-30 ENCOUNTER — Encounter: Payer: Self-pay | Admitting: Vascular Surgery

## 2014-05-30 ENCOUNTER — Ambulatory Visit (INDEPENDENT_AMBULATORY_CARE_PROVIDER_SITE_OTHER)
Admission: RE | Admit: 2014-05-30 | Discharge: 2014-05-30 | Disposition: A | Discharge status: Home / Self Care | Payer: Medicare HMO | Source: Ambulatory Visit | Attending: Vascular Surgery | Admitting: Vascular Surgery

## 2014-05-30 ENCOUNTER — Other Ambulatory Visit: Payer: Self-pay

## 2014-05-30 ENCOUNTER — Ambulatory Visit (INDEPENDENT_AMBULATORY_CARE_PROVIDER_SITE_OTHER): Payer: Medicare HMO | Admitting: Vascular Surgery

## 2014-05-30 VITALS — BP 122/76 | HR 76 | Ht 72.0 in | Wt 235.0 lb

## 2014-05-30 DIAGNOSIS — N184 Chronic kidney disease, stage 4 (severe): Secondary | ICD-10-CM | POA: Insufficient documentation

## 2014-05-30 DIAGNOSIS — Z01810 Encounter for preprocedural cardiovascular examination: Secondary | ICD-10-CM | POA: Diagnosis not present

## 2014-05-30 DIAGNOSIS — N186 End stage renal disease: Secondary | ICD-10-CM

## 2014-05-30 NOTE — Progress Notes (Signed)
Subjective:     Patient ID: Chad [name], male   DOB: 09-11-1955, 59 y.o.   MRN: [id number]  HPI this 59 year old male was referred for evaluation for vascular access. 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 He is on hemodialysis currently Monday Wednesday and Friday through a tunneled catheter. He is right-handed. He is currently nonambulatory because of a femur fracture which occurred 2 years ago and has had complications requiring redo surgery.  Past Medical History  Diagnosis Date  . CKD (chronic kidney disease), stage IV     a. s/p renal transplant 1996.  Marland Kitchen HLD (hyperlipidemia)     takes Pravastatin daily  . Nonischemic cardiomyopathy     a. unknown etiology, EF 30-35% by echo 09/2011;  b. 09/2011 Lexi MV EF 42%, no ischemia/infarct.  . Systolic CHF     takes Lasix daily  . Atrial flutter     takes Amiodarone daily  . Gout     takes Allopurinol daily  . Hypertension     takes Amlodipine,Hydralazine,Imdur,and Coreg daily  . GERD (gastroesophageal reflux disease)     takes Protonix daily  . Shortness of breath dyspnea     exertion  . Pneumonia 2014  . History of bronchitis     several yrs ago  . Joint pain     right shoulder and left knee  . Sciatica   . Hepatitis C 1996    Hep C  . History of colon polyps   . History of kidney stones   . Urinary frequency   . History of shingles   . Hyperparathyroidism due to renal insufficiency     History  Substance Use Topics  . Smoking status: Never Smoker   . Smokeless tobacco: Never Used  . Alcohol Use: No    Family History  Problem Relation Age of Onset  . Heart disease Neg Hx     No Known Allergies   Current outpatient prescriptions:  .  acetaminophen (TYLENOL) 325 MG tablet, Take 2 tablets (650 mg total) by mouth every 6 (six) hours as needed for moderate pain or fever., Disp: , Rfl:  .  allopurinol (ZYLOPRIM) 100 MG tablet, Take 1 tablet (100 mg total) by mouth 2 (two) times daily.,  Disp: 60 tablet, Rfl: 1 .  amiodarone (PACERONE) 100 MG tablet, TAKE 1 TABLET BY MOUTH EVERY DAY, Disp: 30 tablet, Rfl: 6 .  amiodarone (PACERONE) 200 MG tablet, Take 200 mg by mouth daily., Disp: , Rfl: 6 .  amLODipine (NORVASC) 2.5 MG tablet, TAKE 1 TABLET (2.5 MG TOTAL) BY MOUTH DAILY., Disp: 30 tablet, Rfl: 6 .  amoxicillin-clavulanate (AUGMENTIN) 500-125 MG per tablet, TAKE 1 TABLET BY MOUTH TWICE A DAY, Disp: 60 tablet, Rfl: 3 .  aspirin 325 MG tablet, Take 325 mg by mouth daily., Disp: , Rfl:  .  calcitRIOL (ROCALTROL) 0.5 MCG capsule, Take 0.5 mcg by mouth daily., Disp: , Rfl: 11 .  carvedilol (COREG) 25 MG tablet, TAKE 1 TABLET TWICE DAILY WITH A MEAL, Disp: 60 tablet, Rfl: 3 .  Cholecalciferol (VITAMIN D-3) 5000 UNITS TABS, Take 5,000 Units by mouth daily. , Disp: , Rfl:  .  docusate sodium (COLACE) 100 MG capsule, Take 1 capsule (100 mg total) by mouth 2 (two) times daily., Disp: 10 capsule, Rfl: 0 .  enoxaparin (LOVENOX) 40 MG/0.4ML injection, Inject 0.4 mLs (40 mg total) into the skin daily., Disp: 14 Syringe, Rfl: 0 .  furosemide (LASIX) 80 MG tablet, Take  0.5 tablets (40 mg total) by mouth 2 (two) times daily., Disp: 90 tablet, Rfl: 6 .  hydrALAZINE (APRESOLINE) 100 MG tablet, TAKE 1 TABLET (100 MG TOTAL) BY MOUTH 3 (THREE) TIMES DAILY., Disp: 90 tablet, Rfl: 3 .  isosorbide mononitrate (IMDUR) 60 MG 24 hr tablet, TAKE 1.5 TABLETS BY MOUTH EVERY DAY, Disp: 45 tablet, Rfl: 2 .  methocarbamol (ROBAXIN) 500 MG tablet, Take 2 tablets (1,000 mg total) by mouth every 6 (six) hours as needed for muscle spasms., Disp: , Rfl:  .  metolazone (ZAROXOLYN) 2.5 MG tablet, TAKE 1 TABLET BY MOUTH 30 MINUTES BEFORE YOUR MORNING LASIX ON TUES, THURS, AND SAT MORNINGS, Disp: 15 tablet, Rfl: 3 .  oxyCODONE (OXY IR/ROXICODONE) 5 MG immediate release tablet, Take 1-2 tablets (5-10 mg total) by mouth every 3 (three) hours as needed for breakthrough pain., Disp: 70 tablet, Rfl: 0 .  pantoprazole (PROTONIX)  20 MG tablet, Take 20 mg by mouth daily as needed for heartburn. , Disp: , Rfl:  .  potassium chloride SA (KLOR-CON M20) 20 MEQ tablet, TAKE 2 TABLET twice daily, Disp: , Rfl:  .  pravastatin (PRAVACHOL) 40 MG tablet, TAKE 1 TABLET (40 MG TOTAL) BY MOUTH DAILY., Disp: 30 tablet, Rfl: 6 .  predniSONE (DELTASONE) 10 MG tablet, Take 1 tablet (10 mg total) by mouth daily., Disp: 30 tablet, Rfl: 1 .  Probiotic Product (PROBIOTIC DAILY PO), Take 1 capsule by mouth daily. , Disp: , Rfl:  .  senna-docusate (SENOKOT-S) 8.6-50 MG per tablet, Take 1 tablet by mouth at bedtime as needed for mild constipation., Disp: , Rfl:  .  sirolimus (RAPAMUNE) 1 MG/ML solution, Take 1.2 mg by mouth every morning. , Disp: , Rfl:  .  traMADol (ULTRAM) 50 MG tablet, Take 1 tablet (50 mg total) by mouth every 6 (six) hours as needed (pain). As needed for pain, Disp: 60 tablet, Rfl: 0  Filed Vitals:   05/30/14 1115  BP: 122/76  Pulse: 76  Height: 6' (1.829 m)  Weight: 235 lb (106.595 kg)  SpO2: 97%    Body mass index is 31.86 kg/(m^2).           Review of Systems denies chest pain. Has history of pneumonia requiring one-month hospitalization 2 years ago. Had   femur fracture left leg June 2014 and has been nonambulatory since that time. Objective:   Physical Exam BP 122/76 mmHg  Pulse 76  Ht 6' (1.829 m)  Wt 235 lb (106.595 kg)  BMI 31.86 kg/m2  SpO2 97%  Gen.-alert and oriented x3 in no apparent distress-in a wheelchair HEENT normal for age Lungs no rhonchi or wheezing Cardiovascular regular rhythm no murmurs carotid pulses 3+ palpable no bruits audible Abdomen soft nontender no palpable masses Musculoskeletal free of  major deformities Skin clear -no rashes Neurologic normal Lower extremities 3+ femoral and dorsalis pedis pulses palpable bilaterally with 2+ edema bilaterally Left upper extremity with 3+ brachial and 2+ radial pulse palpable.  Today I ordered vein mapping which I reviewed and  interpreted and an arterial study. Arterial supply to both upper extremities is normal. It appears that the cephalic vein in the left upper extremity is adequate in the upper arm and borderline in the forearm with a large basilic vein. I confirmed these results with the sinus site ultrasound on an independent study.      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 Darrick Penna for left brachial cephalic AV fistula

## 2014-05-31 ENCOUNTER — Encounter (HOSPITAL_COMMUNITY): Payer: Self-pay | Admitting: *Deleted

## 2014-05-31 MED ORDER — DEXTROSE 5 % IV SOLN
1.5000 g | INTRAVENOUS | Status: AC
  Administered 2014-06-01: 1.5 g via INTRAVENOUS

## 2014-05-31 MED ORDER — CHLORHEXIDINE GLUCONATE CLOTH 2 % EX PADS: 6.0000 | MEDICATED_PAD | CUTANEOUS | Status: DC

## 2014-05-31 MED ORDER — SODIUM CHLORIDE 0.9 % IV SOLN
INTRAVENOUS | Status: AC
  Administered 2014-06-01: 08:00:00 via INTRAVENOUS

## 2014-06-01 ENCOUNTER — Encounter (HOSPITAL_COMMUNITY): Payer: Self-pay | Admitting: Anesthesiology

## 2014-06-01 ENCOUNTER — Other Ambulatory Visit: Payer: Self-pay | Admitting: *Deleted

## 2014-06-01 ENCOUNTER — Ambulatory Visit (HOSPITAL_COMMUNITY): Payer: Medicare HMO | Admitting: Anesthesiology

## 2014-06-01 ENCOUNTER — Ambulatory Visit (HOSPITAL_COMMUNITY)
Admission: RE | Admit: 2014-06-01 | Discharge: 2014-06-01 | Disposition: A | Discharge status: Home / Self Care | Payer: Medicare HMO | Source: Ambulatory Visit | Attending: Vascular Surgery | Admitting: Vascular Surgery

## 2014-06-01 ENCOUNTER — Encounter (HOSPITAL_COMMUNITY)
Admission: RE | Disposition: A | Discharge status: Home / Self Care | Payer: Self-pay | Source: Ambulatory Visit | Attending: Vascular Surgery

## 2014-06-01 DIAGNOSIS — B1920 Unspecified viral hepatitis C without hepatic coma: Secondary | ICD-10-CM | POA: Insufficient documentation

## 2014-06-01 DIAGNOSIS — Z940 Kidney transplant status: Secondary | ICD-10-CM | POA: Insufficient documentation

## 2014-06-01 DIAGNOSIS — Z992 Dependence on renal dialysis: Secondary | ICD-10-CM | POA: Insufficient documentation

## 2014-06-01 DIAGNOSIS — Z87442 Personal history of urinary calculi: Secondary | ICD-10-CM | POA: Diagnosis not present

## 2014-06-01 DIAGNOSIS — M109 Gout, unspecified: Secondary | ICD-10-CM | POA: Insufficient documentation

## 2014-06-01 DIAGNOSIS — Z7982 Long term (current) use of aspirin: Secondary | ICD-10-CM | POA: Diagnosis not present

## 2014-06-01 DIAGNOSIS — J189 Pneumonia, unspecified organism: Secondary | ICD-10-CM | POA: Diagnosis not present

## 2014-06-01 DIAGNOSIS — I429 Cardiomyopathy, unspecified: Secondary | ICD-10-CM | POA: Diagnosis not present

## 2014-06-01 DIAGNOSIS — I120 Hypertensive chronic kidney disease with stage 5 chronic kidney disease or end stage renal disease: Secondary | ICD-10-CM | POA: Insufficient documentation

## 2014-06-01 DIAGNOSIS — N2581 Secondary hyperparathyroidism of renal origin: Secondary | ICD-10-CM | POA: Insufficient documentation

## 2014-06-01 DIAGNOSIS — Z86010 Personal history of colonic polyps: Secondary | ICD-10-CM | POA: Diagnosis not present

## 2014-06-01 DIAGNOSIS — N186 End stage renal disease: Secondary | ICD-10-CM | POA: Insufficient documentation

## 2014-06-01 DIAGNOSIS — E785 Hyperlipidemia, unspecified: Secondary | ICD-10-CM | POA: Diagnosis not present

## 2014-06-01 DIAGNOSIS — K219 Gastro-esophageal reflux disease without esophagitis: Secondary | ICD-10-CM | POA: Diagnosis not present

## 2014-06-01 DIAGNOSIS — Z4931 Encounter for adequacy testing for hemodialysis: Secondary | ICD-10-CM

## 2014-06-01 HISTORY — PX: AV FISTULA PLACEMENT: SHX1204

## 2014-06-01 HISTORY — DX: Sleep apnea, unspecified: G47.30

## 2014-06-01 LAB — POCT I-STAT 4, (NA,K, GLUC, HGB,HCT)
Glucose, Bld: 94 mg/dL (ref 70–99)
HEMATOCRIT: 45 % (ref 39.0–52.0)
Hemoglobin: 15.3 g/dL (ref 13.0–17.0)
POTASSIUM: 3.8 mmol/L (ref 3.5–5.1)
SODIUM: 134 mmol/L — AB (ref 135–145)

## 2014-06-01 SURGERY — ARTERIOVENOUS (AV) FISTULA CREATION
Anesthesia: Monitor Anesthesia Care | Site: Arm Upper | Laterality: Left

## 2014-06-01 MED ORDER — FENTANYL CITRATE (PF) 250 MCG/5ML IJ SOLN
INTRAMUSCULAR | Status: AC
  Filled 2014-06-01: qty 5

## 2014-06-01 MED ORDER — PROPOFOL 1000 MG/100ML IV EMUL
INTRAVENOUS | Status: AC
  Filled 2014-06-01: qty 100

## 2014-06-01 MED ORDER — CEFUROXIME SODIUM 1.5 G IJ SOLR
INTRAMUSCULAR | Status: AC
  Filled 2014-06-01: qty 1.5

## 2014-06-01 MED ORDER — OXYCODONE HCL 5 MG PO TABS
ORAL_TABLET | ORAL | Status: AC
  Filled 2014-06-01: qty 2

## 2014-06-01 MED ORDER — OXYCODONE HCL 5 MG PO TABS
10.0000 mg | ORAL_TABLET | Freq: Once | ORAL | Status: AC
Start: 2014-06-01 — End: 2014-06-01
  Administered 2014-06-01: 10 mg via ORAL

## 2014-06-01 MED ORDER — LIDOCAINE-EPINEPHRINE (PF) 1 %-1:200000 IJ SOLN
INTRAMUSCULAR | Status: AC
  Filled 2014-06-01: qty 10

## 2014-06-01 MED ORDER — PROPOFOL 10 MG/ML IV BOLUS
INTRAVENOUS | Status: AC
  Filled 2014-06-01: qty 20

## 2014-06-01 MED ORDER — 0.9 % SODIUM CHLORIDE (POUR BTL) OPTIME
TOPICAL | Status: DC | PRN
  Administered 2014-06-01: 1000 mL

## 2014-06-01 MED ORDER — PHENYLEPHRINE 40 MCG/ML (10ML) SYRINGE FOR IV PUSH (FOR BLOOD PRESSURE SUPPORT)
PREFILLED_SYRINGE | INTRAVENOUS | Status: AC
  Filled 2014-06-01: qty 10

## 2014-06-01 MED ORDER — PHENYLEPHRINE HCL 10 MG/ML IJ SOLN
10.0000 mg | INTRAMUSCULAR | Status: DC | PRN
  Administered 2014-06-01: 50 ug/min via INTRAVENOUS

## 2014-06-01 MED ORDER — MIDAZOLAM HCL 2 MG/2ML IJ SOLN
INTRAMUSCULAR | Status: AC
  Filled 2014-06-01: qty 2

## 2014-06-01 MED ORDER — TRAMADOL HCL 50 MG PO TABS: 50.0000 mg | ORAL_TABLET | Freq: Four times a day (QID) | ORAL | Status: DC | PRN

## 2014-06-01 MED ORDER — LIDOCAINE-EPINEPHRINE (PF) 1 %-1:200000 IJ SOLN
INTRAMUSCULAR | Status: DC | PRN
  Administered 2014-06-01: 10 mL

## 2014-06-01 MED ORDER — ACETAMINOPHEN 325 MG PO TABS
650.0000 mg | ORAL_TABLET | Freq: Once | ORAL | Status: AC
  Administered 2014-06-01: 650 mg via ORAL
  Filled 2014-06-01: qty 2

## 2014-06-01 MED ORDER — FENTANYL CITRATE (PF) 100 MCG/2ML IJ SOLN
INTRAMUSCULAR | Status: DC | PRN
  Administered 2014-06-01 (×4): 25 ug via INTRAVENOUS

## 2014-06-01 MED ORDER — PHENYLEPHRINE HCL 10 MG/ML IJ SOLN
INTRAMUSCULAR | Status: DC | PRN
  Administered 2014-06-01: 200 ug via INTRAVENOUS
  Administered 2014-06-01: 120 ug via INTRAVENOUS
  Administered 2014-06-01 (×5): 80 ug via INTRAVENOUS

## 2014-06-01 MED ORDER — SODIUM CHLORIDE 0.9 % IR SOLN
Status: DC | PRN
  Administered 2014-06-01: 500 mL

## 2014-06-01 MED ORDER — ACETAMINOPHEN 325 MG PO TABS
ORAL_TABLET | ORAL | Status: AC
  Administered 2014-06-01: 650 mg via ORAL
  Filled 2014-06-01: qty 2

## 2014-06-01 MED ORDER — PROPOFOL INFUSION 10 MG/ML OPTIME
INTRAVENOUS | Status: DC | PRN
  Administered 2014-06-01: 50 ug/kg/min via INTRAVENOUS

## 2014-06-01 SURGICAL SUPPLY — 33 items
ARMBAND PINK RESTRICT EXTREMIT (MISCELLANEOUS) ×3 IMPLANT
CANISTER SUCTION 2500CC (MISCELLANEOUS) ×3 IMPLANT
CATH EMB 3FR 80CM (CATHETERS) ×2 IMPLANT
CLIP TI MEDIUM 6 (CLIP) ×3 IMPLANT
CLIP TI WIDE RED SMALL 6 (CLIP) ×3 IMPLANT
COVER PROBE W GEL 5X96 (DRAPES) IMPLANT
DRAIN PENROSE 1/4X12 LTX STRL (WOUND CARE) ×5 IMPLANT
ELECT REM PT RETURN 9FT ADLT (ELECTROSURGICAL) ×3
ELECTRODE REM PT RTRN 9FT ADLT (ELECTROSURGICAL) ×1 IMPLANT
GEL ULTRASOUND 20GR AQUASONIC (MISCELLANEOUS) IMPLANT
GLOVE BIO SURGEON STRL SZ 6.5 (GLOVE) ×1 IMPLANT
GLOVE BIO SURGEONS STRL SZ 6.5 (GLOVE) ×1
GLOVE BIOGEL PI IND STRL 6.5 (GLOVE) IMPLANT
GLOVE BIOGEL PI INDICATOR 6.5 (GLOVE) ×8
GLOVE ECLIPSE 6.5 STRL STRAW (GLOVE) ×2 IMPLANT
GLOVE SS BIOGEL STRL SZ 7 (GLOVE) ×1 IMPLANT
GLOVE SUPERSENSE BIOGEL SZ 7 (GLOVE) ×2
GOWN STRL REUS W/ TWL LRG LVL3 (GOWN DISPOSABLE) ×3 IMPLANT
GOWN STRL REUS W/TWL LRG LVL3 (GOWN DISPOSABLE) ×12
KIT BASIN OR (CUSTOM PROCEDURE TRAY) ×3 IMPLANT
KIT ROOM TURNOVER OR (KITS) ×3 IMPLANT
LIQUID BAND (GAUZE/BANDAGES/DRESSINGS) ×3 IMPLANT
NS IRRIG 1000ML POUR BTL (IV SOLUTION) ×3 IMPLANT
PACK CV ACCESS (CUSTOM PROCEDURE TRAY) ×3 IMPLANT
PAD ARMBOARD 7.5X6 YLW CONV (MISCELLANEOUS) ×6 IMPLANT
SUT PROLENE 6 0 BV (SUTURE) ×9 IMPLANT
SUT SILK 3 0 (SUTURE) ×3
SUT SILK 3-0 18XBRD TIE 12 (SUTURE) IMPLANT
SUT VIC AB 3-0 SH 27 (SUTURE) ×3
SUT VIC AB 3-0 SH 27X BRD (SUTURE) ×1 IMPLANT
SYR TB 1ML LUER SLIP (SYRINGE) ×2 IMPLANT
UNDERPAD 30X30 INCONTINENT (UNDERPADS AND DIAPERS) ×3 IMPLANT
WATER STERILE IRR 1000ML POUR (IV SOLUTION) ×3 IMPLANT

## 2014-06-01 NOTE — Anesthesia Postprocedure Evaluation (Signed)
  Anesthesia Post-op Note  Patient: Chad [name]  Procedure(s) Performed: Procedure(s): LEFT BRACHIOCEPHALIC ARTERIOVENOUS (AV) FISTULA CREATION (Left)  Patient Location: PACU  Anesthesia Type:MAC  Level of Consciousness: awake, alert  and oriented  Airway and Oxygen Therapy: Patient Spontanous Breathing  Post-op Pain: none  Post-op Assessment: Post-op Vital signs reviewed, Patient's Cardiovascular Status Stable, Respiratory Function Stable, Patent Airway and Pain level controlled  Post-op Vital Signs: stable  Last Vitals:  Filed Vitals:   06/01/14 1015  BP: 122/69  Pulse: 71  Temp: 36.6 C  Resp: 20    Complications: No apparent anesthesia complications

## 2014-06-01 NOTE — Interval H&P Note (Signed)
History and Physical Interval Note:  06/01/2014 7:28 AM  Chad [name]  has presented today for surgery, with the diagnosis of End Stage Renal Disease N18.6  The various methods of treatment have been discussed with the patient and family. After consideration of risks, benefits and other options for treatment, the patient has consented to  Procedure(s): LEFT BRACHIOCEPHALIC ARTERIOVENOUS (AV) FISTULA CREATION (Left) as a surgical intervention .  The patient's history has been reviewed, patient examined, no change in status, stable for surgery.  I have reviewed the patient's chart and labs.  Questions were answered to the patient's satisfaction.     [name]

## 2014-06-01 NOTE — Op Note (Signed)
OPERATIVE REPORT  Date of Surgery: 06/01/2014  Surgeon: [name], MD  Assistant: Lianne Cure PA  Pre-op Diagnosis: End Stage Renal Disease N18.6  Post-op Diagnosis: End Stage Renal Disease N18.6  Procedure: Procedure(s): LEFT BRACHIOCEPHALIC ARTERIOVENOUS (AV) FISTULA CREATION  Anesthesia: Mac  EBL: Minimal  Complications: None  Procedure Details: The patient was taken the operating room placed in supine position at which time satisfactory prepping and draping of the left upper extremity was performed. The brachial artery and cephalic vein were marked in the antecubital area and visualized with the ultrasound-sono site felt to be adequate for fistula creation. After infiltration forms of Xylocaine with epinephrine transverse incision was made in the cubital area antecubital vein dissected free. The cephalic branch was about 3-1/2 mm in size it was ligated distally transected a 3 Fogarty catheter was passed proximally and was it was widely patent up to the central veins. Brachial artery was exposed in a fashion circumflex vessel loops. An excellent pulse. It was then occluded proximally and distally opened 15 blade extended with Potts scissors. The vein was carefully measured spatulated anastomosis inside with 6-0 Prolene. Vessel loops then released and excellent pulse and a palpable thrill in the fistula with Doppler flow audible in the upper arm up to the proximal arm over the cephalic vein. There was also radial and ulnar flow distally which slightly improved with compression of the fistula. Adequate hemostasis was achieved wound closed in layers with Vicryl subcuticular fashion with Dermabond patient taken to recovery room in satisfactory condition   [name], MD 06/01/2014 9:08 AM

## 2014-06-01 NOTE — Transfer of Care (Signed)
Immediate Anesthesia Transfer of Care Note  Patient: Chad [name]  Procedure(s) Performed: Procedure(s): LEFT BRACHIOCEPHALIC ARTERIOVENOUS (AV) FISTULA CREATION (Left)  Patient Location: PACU  Anesthesia Type:MAC  Level of Consciousness: awake and alert   Airway & Oxygen Therapy: Patient Spontanous Breathing and Patient connected to face mask oxygen  Post-op Assessment: Report given to RN and Post -op Vital signs reviewed and stable  Post vital signs: Reviewed and stable  Last Vitals:  Filed Vitals:   06/01/14 0649  BP:   Pulse:   Temp: 37.2 C  Resp:     Complications: No apparent anesthesia complications

## 2014-06-01 NOTE — Anesthesia Preprocedure Evaluation (Addendum)
Anesthesia Evaluation  Patient identified by MRN, date of birth, ID band Patient awake    Reviewed: Allergy & Precautions, NPO status , Patient's Chart, lab work & pertinent test results  Airway Mallampati: II  TM Distance: >3 FB Neck ROM: Full    Dental  (+) Teeth Intact, Dental Advisory Given   Pulmonary  breath sounds clear to auscultation        Cardiovascular hypertension, Rhythm:Regular Rate:Normal     Neuro/Psych    GI/Hepatic   Endo/Other    Renal/GU      Musculoskeletal   Abdominal   Peds  Hematology   Anesthesia Other Findings   Reproductive/Obstetrics                            Anesthesia Physical Anesthesia Plan  ASA: III  Anesthesia Plan: MAC   Post-op Pain Management:    Induction: Intravenous  Airway Management Planned: Natural Airway and Simple Face Mask  Additional Equipment:   Intra-op Plan:   Post-operative Plan:   Informed Consent: I have reviewed the patients History and Physical, chart, labs and discussed the procedure including the risks, benefits and alternatives for the proposed anesthesia with the patient or authorized representative who has indicated his/her understanding and acceptance.   Dental advisory given  Plan Discussed with: CRNA and Anesthesiologist  Anesthesia Plan Comments: (S/P Renal transplant needs to resume HD K-3.4 Non-ischemic cardiomyopathy last EF 60% on ECHO H/O Atrial Flutter now in SR Hypertension Hepatitis C  Plan MAC  Kipp Brood)       Anesthesia Quick Evaluation

## 2014-06-01 NOTE — H&P (View-Only) (Signed)
Subjective:     Patient ID: Chad [name], male   DOB: 04/28/1955, 59 y.o.   MRN: [id number]  HPI this 59-year-old male was referred for evaluation for vascular access. 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 He is on hemodialysis currently Monday Wednesday and Friday through a tunneled catheter. He is right-handed. He is currently nonambulatory because of a femur fracture which occurred 2 years ago and has had complications requiring redo surgery.  Past Medical History  Diagnosis Date  . CKD (chronic kidney disease), stage IV     a. s/p renal transplant 1996.  . HLD (hyperlipidemia)     takes Pravastatin daily  . Nonischemic cardiomyopathy     a. unknown etiology, EF 30-35% by echo 09/2011;  b. 09/2011 Lexi MV EF 42%, no ischemia/infarct.  . Systolic CHF     takes Lasix daily  . Atrial flutter     takes Amiodarone daily  . Gout     takes Allopurinol daily  . Hypertension     takes Amlodipine,Hydralazine,Imdur,and Coreg daily  . GERD (gastroesophageal reflux disease)     takes Protonix daily  . Shortness of breath dyspnea     exertion  . Pneumonia 2014  . History of bronchitis     several yrs ago  . Joint pain     right shoulder and left knee  . Sciatica   . Hepatitis C 1996    Hep C  . History of colon polyps   . History of kidney stones   . Urinary frequency   . History of shingles   . Hyperparathyroidism due to renal insufficiency     History  Substance Use Topics  . Smoking status: Never Smoker   . Smokeless tobacco: Never Used  . Alcohol Use: No    Family History  Problem Relation Age of Onset  . Heart disease Neg Hx     No Known Allergies   Current outpatient prescriptions:  .  acetaminophen (TYLENOL) 325 MG tablet, Take 2 tablets (650 mg total) by mouth every 6 (six) hours as needed for moderate pain or fever., Disp: , Rfl:  .  allopurinol (ZYLOPRIM) 100 MG tablet, Take 1 tablet (100 mg total) by mouth 2 (two) times daily.,  Disp: 60 tablet, Rfl: 1 .  amiodarone (PACERONE) 100 MG tablet, TAKE 1 TABLET BY MOUTH EVERY DAY, Disp: 30 tablet, Rfl: 6 .  amiodarone (PACERONE) 200 MG tablet, Take 200 mg by mouth daily., Disp: , Rfl: 6 .  amLODipine (NORVASC) 2.5 MG tablet, TAKE 1 TABLET (2.5 MG TOTAL) BY MOUTH DAILY., Disp: 30 tablet, Rfl: 6 .  amoxicillin-clavulanate (AUGMENTIN) 500-125 MG per tablet, TAKE 1 TABLET BY MOUTH TWICE A DAY, Disp: 60 tablet, Rfl: 3 .  aspirin 325 MG tablet, Take 325 mg by mouth daily., Disp: , Rfl:  .  calcitRIOL (ROCALTROL) 0.5 MCG capsule, Take 0.5 mcg by mouth daily., Disp: , Rfl: 11 .  carvedilol (COREG) 25 MG tablet, TAKE 1 TABLET TWICE DAILY WITH A MEAL, Disp: 60 tablet, Rfl: 3 .  Cholecalciferol (VITAMIN D-3) 5000 UNITS TABS, Take 5,000 Units by mouth daily. , Disp: , Rfl:  .  docusate sodium (COLACE) 100 MG capsule, Take 1 capsule (100 mg total) by mouth 2 (two) times daily., Disp: 10 capsule, Rfl: 0 .  enoxaparin (LOVENOX) 40 MG/0.4ML injection, Inject 0.4 mLs (40 mg total) into the skin daily., Disp: 14 Syringe, Rfl: 0 .  furosemide (LASIX) 80 MG tablet, Take   0.5 tablets (40 mg total) by mouth 2 (two) times daily., Disp: 90 tablet, Rfl: 6 .  hydrALAZINE (APRESOLINE) 100 MG tablet, TAKE 1 TABLET (100 MG TOTAL) BY MOUTH 3 (THREE) TIMES DAILY., Disp: 90 tablet, Rfl: 3 .  isosorbide mononitrate (IMDUR) 60 MG 24 hr tablet, TAKE 1.5 TABLETS BY MOUTH EVERY DAY, Disp: 45 tablet, Rfl: 2 .  methocarbamol (ROBAXIN) 500 MG tablet, Take 2 tablets (1,000 mg total) by mouth every 6 (six) hours as needed for muscle spasms., Disp: , Rfl:  .  metolazone (ZAROXOLYN) 2.5 MG tablet, TAKE 1 TABLET BY MOUTH 30 MINUTES BEFORE YOUR MORNING LASIX ON TUES, THURS, AND SAT MORNINGS, Disp: 15 tablet, Rfl: 3 .  oxyCODONE (OXY IR/ROXICODONE) 5 MG immediate release tablet, Take 1-2 tablets (5-10 mg total) by mouth every 3 (three) hours as needed for breakthrough pain., Disp: 70 tablet, Rfl: 0 .  pantoprazole (PROTONIX)  20 MG tablet, Take 20 mg by mouth daily as needed for heartburn. , Disp: , Rfl:  .  potassium chloride SA (KLOR-CON M20) 20 MEQ tablet, TAKE 2 TABLET twice daily, Disp: , Rfl:  .  pravastatin (PRAVACHOL) 40 MG tablet, TAKE 1 TABLET (40 MG TOTAL) BY MOUTH DAILY., Disp: 30 tablet, Rfl: 6 .  predniSONE (DELTASONE) 10 MG tablet, Take 1 tablet (10 mg total) by mouth daily., Disp: 30 tablet, Rfl: 1 .  Probiotic Product (PROBIOTIC DAILY PO), Take 1 capsule by mouth daily. , Disp: , Rfl:  .  senna-docusate (SENOKOT-S) 8.6-50 MG per tablet, Take 1 tablet by mouth at bedtime as needed for mild constipation., Disp: , Rfl:  .  sirolimus (RAPAMUNE) 1 MG/ML solution, Take 1.2 mg by mouth every morning. , Disp: , Rfl:  .  traMADol (ULTRAM) 50 MG tablet, Take 1 tablet (50 mg total) by mouth every 6 (six) hours as needed (pain). As needed for pain, Disp: 60 tablet, Rfl: 0  Filed Vitals:   05/30/14 1115  BP: 122/76  Pulse: 76  Height: 6' (1.829 m)  Weight: 235 lb (106.595 kg)  SpO2: 97%    Body mass index is 31.86 kg/(m^2).           Review of Systems denies chest pain. Has history of pneumonia requiring one-month hospitalization 2 years ago. Had   femur fracture left leg June 2014 and has been nonambulatory since that time. Objective:   Physical Exam BP 122/76 mmHg  Pulse 76  Ht 6' (1.829 m)  Wt 235 lb (106.595 kg)  BMI 31.86 kg/m2  SpO2 97%  Gen.-alert and oriented x3 in no apparent distress-in a wheelchair HEENT normal for age Lungs no rhonchi or wheezing Cardiovascular regular rhythm no murmurs carotid pulses 3+ palpable no bruits audible Abdomen soft nontender no palpable masses Musculoskeletal free of  major deformities Skin clear -no rashes Neurologic normal Lower extremities 3+ femoral and dorsalis pedis pulses palpable bilaterally with 2+ edema bilaterally Left upper extremity with 3+ brachial and 2+ radial pulse palpable.  Today I ordered vein mapping which I reviewed and  interpreted and an arterial study. Arterial supply to both upper extremities is normal. It appears that the cephalic vein in the left upper extremity is adequate in the upper arm and borderline in the forearm with a large basilic vein. I confirmed these results with the sinus site ultrasound on an independent study.      Assessment:     End-stage renal disease with failed renal transplant from 1996-currently on hemodialysis Monday Wednesday Friday needs access      Plan:     We'll schedule for this Thursday, May 5 if patient can arrange transportation for left brachial cephalic AV fistula If he is unable to arrange transportation he will be scheduled for next Tuesday, May 10 by Dr. fields for left brachial cephalic AV fistula      

## 2014-06-02 ENCOUNTER — Telehealth: Payer: Self-pay | Admitting: Vascular Surgery

## 2014-06-02 NOTE — Telephone Encounter (Signed)
-----   Message from Sharee Pimple, RN sent at 06/01/2014  9:16 AM EDT ----- Regarding: Schedule   ----- Message -----    From: Lars Mage, PA-C    Sent: 06/01/2014   9:00 AM      To: [facility] Charge Pool  F/U with Dr. Hart [place] s/p left AV fistula creation with fistula duplex 6-8 weeks

## 2014-06-02 NOTE — Telephone Encounter (Signed)
Spoke with pt to schedule, mailed letter, dpm

## 2014-06-04 ENCOUNTER — Other Ambulatory Visit (HOSPITAL_COMMUNITY): Payer: Self-pay | Admitting: Cardiology

## 2014-06-05 ENCOUNTER — Encounter (HOSPITAL_COMMUNITY): Payer: Self-pay | Admitting: Vascular Surgery

## 2014-06-07 ENCOUNTER — Encounter (HOSPITAL_COMMUNITY): Payer: Self-pay | Admitting: *Deleted

## 2014-06-07 NOTE — Progress Notes (Signed)
Pt denies SOB and chest pain but is under the care of Dr. Sherlie Ban, cardiology. Pt denies having a cardiac cath. Pt made aware to stop Herbal medications, otc vitamins, and NSAID's. Pt verbalized understanding of all pre-op instructions.

## 2014-06-08 ENCOUNTER — Encounter (HOSPITAL_COMMUNITY)
Admission: RE | Disposition: A | Discharge status: Skilled Nursing Facility | Payer: Self-pay | Source: Ambulatory Visit | Attending: Orthopedic Surgery

## 2014-06-08 ENCOUNTER — Encounter (HOSPITAL_COMMUNITY): Payer: Self-pay | Admitting: *Deleted

## 2014-06-08 ENCOUNTER — Inpatient Hospital Stay (HOSPITAL_COMMUNITY)
Admission: RE | Admit: 2014-06-08 | Discharge: 2014-06-19 | DRG: 939 | Disposition: A | Discharge status: Skilled Nursing Facility | Payer: Medicare HMO | Source: Ambulatory Visit | Attending: Orthopedic Surgery | Admitting: Orthopedic Surgery

## 2014-06-08 ENCOUNTER — Inpatient Hospital Stay (HOSPITAL_COMMUNITY): Payer: Medicare HMO | Admitting: Anesthesiology

## 2014-06-08 ENCOUNTER — Inpatient Hospital Stay (HOSPITAL_COMMUNITY): Payer: Medicare HMO

## 2014-06-08 DIAGNOSIS — J9811 Atelectasis: Secondary | ICD-10-CM | POA: Diagnosis present

## 2014-06-08 DIAGNOSIS — B9689 Other specified bacterial agents as the cause of diseases classified elsewhere: Secondary | ICD-10-CM | POA: Diagnosis not present

## 2014-06-08 DIAGNOSIS — Z992 Dependence on renal dialysis: Secondary | ICD-10-CM | POA: Diagnosis not present

## 2014-06-08 DIAGNOSIS — G4733 Obstructive sleep apnea (adult) (pediatric): Secondary | ICD-10-CM | POA: Diagnosis present

## 2014-06-08 DIAGNOSIS — D62 Acute posthemorrhagic anemia: Secondary | ICD-10-CM | POA: Diagnosis not present

## 2014-06-08 DIAGNOSIS — E119 Type 2 diabetes mellitus without complications: Secondary | ICD-10-CM | POA: Diagnosis present

## 2014-06-08 DIAGNOSIS — R1084 Generalized abdominal pain: Secondary | ICD-10-CM | POA: Diagnosis not present

## 2014-06-08 DIAGNOSIS — S81812D Laceration without foreign body, left lower leg, subsequent encounter: Secondary | ICD-10-CM | POA: Diagnosis not present

## 2014-06-08 DIAGNOSIS — I420 Dilated cardiomyopathy: Secondary | ICD-10-CM

## 2014-06-08 DIAGNOSIS — T84031A Mechanical loosening of internal left hip prosthetic joint, initial encounter: Secondary | ICD-10-CM | POA: Diagnosis present

## 2014-06-08 DIAGNOSIS — J9600 Acute respiratory failure, unspecified whether with hypoxia or hypercapnia: Secondary | ICD-10-CM | POA: Diagnosis not present

## 2014-06-08 DIAGNOSIS — M8660 Other chronic osteomyelitis, unspecified site: Secondary | ICD-10-CM | POA: Diagnosis present

## 2014-06-08 DIAGNOSIS — E2740 Unspecified adrenocortical insufficiency: Secondary | ICD-10-CM | POA: Diagnosis present

## 2014-06-08 DIAGNOSIS — I480 Paroxysmal atrial fibrillation: Secondary | ICD-10-CM | POA: Diagnosis not present

## 2014-06-08 DIAGNOSIS — I120 Hypertensive chronic kidney disease with stage 5 chronic kidney disease or end stage renal disease: Secondary | ICD-10-CM | POA: Diagnosis present

## 2014-06-08 DIAGNOSIS — Z993 Dependence on wheelchair: Secondary | ICD-10-CM

## 2014-06-08 DIAGNOSIS — R4182 Altered mental status, unspecified: Secondary | ICD-10-CM | POA: Diagnosis not present

## 2014-06-08 DIAGNOSIS — I950 Idiopathic hypotension: Secondary | ICD-10-CM | POA: Diagnosis not present

## 2014-06-08 DIAGNOSIS — K219 Gastro-esophageal reflux disease without esophagitis: Secondary | ICD-10-CM | POA: Diagnosis present

## 2014-06-08 DIAGNOSIS — Z7952 Long term (current) use of systemic steroids: Secondary | ICD-10-CM | POA: Diagnosis not present

## 2014-06-08 DIAGNOSIS — B964 Proteus (mirabilis) (morganii) as the cause of diseases classified elsewhere: Secondary | ICD-10-CM | POA: Diagnosis present

## 2014-06-08 DIAGNOSIS — T814XXA Infection following a procedure, initial encounter: Secondary | ICD-10-CM

## 2014-06-08 DIAGNOSIS — N189 Chronic kidney disease, unspecified: Secondary | ICD-10-CM | POA: Diagnosis present

## 2014-06-08 DIAGNOSIS — S72432K Displaced fracture of medial condyle of left femur, subsequent encounter for closed fracture with nonunion: Secondary | ICD-10-CM

## 2014-06-08 DIAGNOSIS — S72392K Other fracture of shaft of left femur, subsequent encounter for closed fracture with nonunion: Secondary | ICD-10-CM | POA: Diagnosis not present

## 2014-06-08 DIAGNOSIS — Y838 Other surgical procedures as the cause of abnormal reaction of the patient, or of later complication, without mention of misadventure at the time of the procedure: Secondary | ICD-10-CM

## 2014-06-08 DIAGNOSIS — M86352 Chronic multifocal osteomyelitis, left femur: Secondary | ICD-10-CM | POA: Diagnosis not present

## 2014-06-08 DIAGNOSIS — Z452 Encounter for adjustment and management of vascular access device: Secondary | ICD-10-CM

## 2014-06-08 DIAGNOSIS — B182 Chronic viral hepatitis C: Secondary | ICD-10-CM | POA: Diagnosis present

## 2014-06-08 DIAGNOSIS — I5022 Chronic systolic (congestive) heart failure: Secondary | ICD-10-CM | POA: Diagnosis present

## 2014-06-08 DIAGNOSIS — D696 Thrombocytopenia, unspecified: Secondary | ICD-10-CM | POA: Diagnosis present

## 2014-06-08 DIAGNOSIS — I429 Cardiomyopathy, unspecified: Secondary | ICD-10-CM

## 2014-06-08 DIAGNOSIS — M86152 Other acute osteomyelitis, left femur: Secondary | ICD-10-CM | POA: Diagnosis not present

## 2014-06-08 DIAGNOSIS — A419 Sepsis, unspecified organism: Secondary | ICD-10-CM

## 2014-06-08 DIAGNOSIS — Z01818 Encounter for other preprocedural examination: Secondary | ICD-10-CM

## 2014-06-08 DIAGNOSIS — R109 Unspecified abdominal pain: Secondary | ICD-10-CM

## 2014-06-08 DIAGNOSIS — Z419 Encounter for procedure for purposes other than remedying health state, unspecified: Secondary | ICD-10-CM

## 2014-06-08 DIAGNOSIS — I959 Hypotension, unspecified: Secondary | ICD-10-CM

## 2014-06-08 DIAGNOSIS — I4891 Unspecified atrial fibrillation: Secondary | ICD-10-CM | POA: Diagnosis present

## 2014-06-08 DIAGNOSIS — R579 Shock, unspecified: Secondary | ICD-10-CM

## 2014-06-08 DIAGNOSIS — E785 Hyperlipidemia, unspecified: Secondary | ICD-10-CM | POA: Diagnosis present

## 2014-06-08 DIAGNOSIS — M869 Osteomyelitis, unspecified: Secondary | ICD-10-CM | POA: Diagnosis present

## 2014-06-08 DIAGNOSIS — B1920 Unspecified viral hepatitis C without hepatic coma: Secondary | ICD-10-CM | POA: Diagnosis present

## 2014-06-08 DIAGNOSIS — R6521 Severe sepsis with septic shock: Secondary | ICD-10-CM | POA: Diagnosis not present

## 2014-06-08 DIAGNOSIS — T8612 Kidney transplant failure: Secondary | ICD-10-CM | POA: Diagnosis present

## 2014-06-08 DIAGNOSIS — T814XXD Infection following a procedure, subsequent encounter: Secondary | ICD-10-CM | POA: Diagnosis not present

## 2014-06-08 DIAGNOSIS — M009 Pyogenic arthritis, unspecified: Secondary | ICD-10-CM | POA: Diagnosis present

## 2014-06-08 DIAGNOSIS — S72412K Displaced unspecified condyle fracture of lower end of left femur, subsequent encounter for closed fracture with nonunion: Secondary | ICD-10-CM | POA: Diagnosis not present

## 2014-06-08 DIAGNOSIS — L97929 Non-pressure chronic ulcer of unspecified part of left lower leg with unspecified severity: Secondary | ICD-10-CM | POA: Diagnosis present

## 2014-06-08 DIAGNOSIS — N186 End stage renal disease: Secondary | ICD-10-CM | POA: Diagnosis present

## 2014-06-08 DIAGNOSIS — I4892 Unspecified atrial flutter: Secondary | ICD-10-CM | POA: Diagnosis present

## 2014-06-08 DIAGNOSIS — I9589 Other hypotension: Secondary | ICD-10-CM | POA: Diagnosis not present

## 2014-06-08 DIAGNOSIS — N2581 Secondary hyperparathyroidism of renal origin: Secondary | ICD-10-CM | POA: Diagnosis present

## 2014-06-08 DIAGNOSIS — B1710 Acute hepatitis C without hepatic coma: Secondary | ICD-10-CM | POA: Diagnosis not present

## 2014-06-08 DIAGNOSIS — D631 Anemia in chronic kidney disease: Secondary | ICD-10-CM | POA: Diagnosis present

## 2014-06-08 DIAGNOSIS — Z940 Kidney transplant status: Secondary | ICD-10-CM

## 2014-06-08 DIAGNOSIS — S72422K Displaced fracture of lateral condyle of left femur, subsequent encounter for closed fracture with nonunion: Secondary | ICD-10-CM | POA: Diagnosis present

## 2014-06-08 DIAGNOSIS — B9562 Methicillin resistant Staphylococcus aureus infection as the cause of diseases classified elsewhere: Secondary | ICD-10-CM | POA: Diagnosis not present

## 2014-06-08 DIAGNOSIS — T8610 Unspecified complication of kidney transplant: Secondary | ICD-10-CM | POA: Diagnosis present

## 2014-06-08 HISTORY — DX: Unspecified open wound, left lower leg, initial encounter: S81.802A

## 2014-06-08 HISTORY — PX: INCISION AND DRAINAGE: SHX5863

## 2014-06-08 LAB — CBC WITH DIFFERENTIAL/PLATELET
BASOS ABS: 0.2 10*3/uL — AB (ref 0.0–0.1)
BASOS PCT: 1 % (ref 0–1)
Eosinophils Absolute: 0.2 10*3/uL (ref 0.0–0.7)
Eosinophils Relative: 1 % (ref 0–5)
HCT: 42.5 % (ref 39.0–52.0)
HEMOGLOBIN: 13.9 g/dL (ref 13.0–17.0)
LYMPHS ABS: 2.6 10*3/uL (ref 0.7–4.0)
LYMPHS PCT: 22 % (ref 12–46)
MCH: 30.1 pg (ref 26.0–34.0)
MCHC: 32.7 g/dL (ref 30.0–36.0)
MCV: 92 fL (ref 78.0–100.0)
Monocytes Absolute: 1.8 10*3/uL — ABNORMAL HIGH (ref 0.1–1.0)
Monocytes Relative: 15 % — ABNORMAL HIGH (ref 3–12)
NEUTROS PCT: 61 % (ref 43–77)
Neutro Abs: 7.2 10*3/uL (ref 1.7–7.7)
PLATELETS: 140 10*3/uL — AB (ref 150–400)
RBC: 4.62 MIL/uL (ref 4.22–5.81)
RDW: 17.3 % — ABNORMAL HIGH (ref 11.5–15.5)
WBC: 11.9 10*3/uL — AB (ref 4.0–10.5)

## 2014-06-08 LAB — GRAM STAIN

## 2014-06-08 LAB — RENAL FUNCTION PANEL
Albumin: 2.9 g/dL — ABNORMAL LOW (ref 3.5–5.0)
Anion gap: 14 (ref 5–15)
BUN: 22 mg/dL — AB (ref 6–20)
CO2: 24 mmol/L (ref 22–32)
Calcium: 8.5 mg/dL — ABNORMAL LOW (ref 8.9–10.3)
Chloride: 96 mmol/L — ABNORMAL LOW (ref 101–111)
Creatinine, Ser: 3.78 mg/dL — ABNORMAL HIGH (ref 0.61–1.24)
GFR calc Af Amer: 19 mL/min — ABNORMAL LOW (ref >60)
GFR calc non Af Amer: 16 mL/min — ABNORMAL LOW (ref >60)
Glucose, Bld: 107 mg/dL — ABNORMAL HIGH (ref 65–99)
Phosphorus: 5.3 mg/dL — ABNORMAL HIGH (ref 2.5–4.6)
Potassium: 3.6 mmol/L (ref 3.5–5.1)
Sodium: 134 mmol/L — ABNORMAL LOW (ref 135–145)

## 2014-06-08 LAB — CBC
HEMATOCRIT: 31.6 % — AB (ref 39.0–52.0)
Hemoglobin: 9.8 g/dL — ABNORMAL LOW (ref 13.0–17.0)
MCH: 28.7 pg (ref 26.0–34.0)
MCHC: 31 g/dL (ref 30.0–36.0)
MCV: 92.7 fL (ref 78.0–100.0)
Platelets: 156 10*3/uL (ref 150–400)
RBC: 3.41 MIL/uL — ABNORMAL LOW (ref 4.22–5.81)
RDW: 17.3 % — ABNORMAL HIGH (ref 11.5–15.5)
WBC: 16.6 10*3/uL — AB (ref 4.0–10.5)

## 2014-06-08 LAB — COMPREHENSIVE METABOLIC PANEL
ALK PHOS: 65 U/L (ref 38–126)
ALT: 7 U/L — AB (ref 17–63)
ANION GAP: 13 (ref 5–15)
AST: 19 U/L (ref 15–41)
Albumin: 2.4 g/dL — ABNORMAL LOW (ref 3.5–5.0)
BILIRUBIN TOTAL: 0.5 mg/dL (ref 0.3–1.2)
BUN: 21 mg/dL — AB (ref 6–20)
CHLORIDE: 97 mmol/L — AB (ref 101–111)
CO2: 27 mmol/L (ref 22–32)
Calcium: 9 mg/dL (ref 8.9–10.3)
Creatinine, Ser: 3.42 mg/dL — ABNORMAL HIGH (ref 0.61–1.24)
GFR calc non Af Amer: 18 mL/min — ABNORMAL LOW (ref >60)
GFR, EST AFRICAN AMERICAN: 21 mL/min — AB (ref >60)
GLUCOSE: 77 mg/dL (ref 65–99)
Potassium: 3.4 mmol/L — ABNORMAL LOW (ref 3.5–5.1)
Sodium: 137 mmol/L (ref 135–145)
TOTAL PROTEIN: 8 g/dL (ref 6.5–8.1)

## 2014-06-08 LAB — C-REACTIVE PROTEIN: CRP: 5 mg/dL — AB (ref <1.0)

## 2014-06-08 LAB — CORTISOL: Cortisol, Plasma: 13.1 ug/dL

## 2014-06-08 LAB — SEDIMENTATION RATE: Sed Rate: 38 mm/hr — ABNORMAL HIGH (ref 0–16)

## 2014-06-08 LAB — GLUCOSE, CAPILLARY: GLUCOSE-CAPILLARY: 78 mg/dL (ref 65–99)

## 2014-06-08 LAB — HEMOGLOBIN AND HEMATOCRIT, BLOOD
HEMATOCRIT: 33 % — AB (ref 39.0–52.0)
Hemoglobin: 10.2 g/dL — ABNORMAL LOW (ref 13.0–17.0)

## 2014-06-08 LAB — MRSA PCR SCREENING: MRSA by PCR: POSITIVE — AB

## 2014-06-08 SURGERY — INCISION AND DRAINAGE
Anesthesia: General | Site: Thigh | Laterality: Left

## 2014-06-08 MED ORDER — PHENYLEPHRINE 40 MCG/ML (10ML) SYRINGE FOR IV PUSH (FOR BLOOD PRESSURE SUPPORT)
PREFILLED_SYRINGE | INTRAVENOUS | Status: AC
  Filled 2014-06-08: qty 10

## 2014-06-08 MED ORDER — AMIODARONE HCL 100 MG PO TABS
100.0000 mg | ORAL_TABLET | Freq: Every day | ORAL | Status: DC
  Administered 2014-06-10 – 2014-06-19 (×9): 100 mg via ORAL
  Filled 2014-06-08 (×10): qty 1

## 2014-06-08 MED ORDER — ACETAMINOPHEN 650 MG RE SUPP: 650.0000 mg | Freq: Four times a day (QID) | RECTAL | Status: DC | PRN

## 2014-06-08 MED ORDER — PROPOFOL 10 MG/ML IV BOLUS
INTRAVENOUS | Status: AC
  Filled 2014-06-08: qty 20

## 2014-06-08 MED ORDER — CARVEDILOL 25 MG PO TABS
25.0000 mg | ORAL_TABLET | Freq: Two times a day (BID) | ORAL | Status: DC
  Filled 2014-06-08 (×4): qty 1

## 2014-06-08 MED ORDER — DEXTROSE 5 % IV SOLN
30.0000 ug/min | INTRAVENOUS | Status: DC
  Administered 2014-06-08: 25 ug/min via INTRAVENOUS
  Administered 2014-06-09: 50 ug/min via INTRAVENOUS
  Filled 2014-06-08 (×2): qty 1

## 2014-06-08 MED ORDER — POTASSIUM CHLORIDE IN NACL 20-0.9 MEQ/L-% IV SOLN
INTRAVENOUS | Status: DC
  Administered 2014-06-08: 21:00:00 via INTRAVENOUS
  Filled 2014-06-08 (×2): qty 1000

## 2014-06-08 MED ORDER — PROPOFOL 10 MG/ML IV BOLUS
INTRAVENOUS | Status: DC | PRN
  Administered 2014-06-08: 100 mg via INTRAVENOUS
  Administered 2014-06-08: 20 mg via INTRAVENOUS

## 2014-06-08 MED ORDER — ALBUMIN HUMAN 5 % IV SOLN
INTRAVENOUS | Status: AC
  Administered 2014-06-08: 12.5 g
  Filled 2014-06-08: qty 250

## 2014-06-08 MED ORDER — CALCITRIOL 0.5 MCG PO CAPS
0.5000 ug | ORAL_CAPSULE | Freq: Every day | ORAL | Status: DC
  Filled 2014-06-08: qty 1

## 2014-06-08 MED ORDER — VANCOMYCIN HCL IN DEXTROSE 1-5 GM/200ML-% IV SOLN
1000.0000 mg | INTRAVENOUS | Status: AC
  Administered 2014-06-08: 1000 mg via INTRAVENOUS
  Filled 2014-06-08: qty 200

## 2014-06-08 MED ORDER — VITAMIN D-3 125 MCG (5000 UT) PO TABS: 5000.0000 [IU] | ORAL_TABLET | Freq: Every day | ORAL | Status: DC

## 2014-06-08 MED ORDER — LIDOCAINE HCL (CARDIAC) 20 MG/ML IV SOLN
INTRAVENOUS | Status: DC | PRN
  Administered 2014-06-08: 70 mg via INTRAVENOUS

## 2014-06-08 MED ORDER — HEPARIN SODIUM (PORCINE) 1000 UNIT/ML DIALYSIS: 1000.0000 [IU] | INTRAMUSCULAR | Status: DC | PRN

## 2014-06-08 MED ORDER — MIDAZOLAM HCL 2 MG/2ML IJ SOLN
INTRAMUSCULAR | Status: AC
  Filled 2014-06-08: qty 2

## 2014-06-08 MED ORDER — DOXERCALCIFEROL 4 MCG/2ML IV SOLN
1.0000 ug | INTRAVENOUS | Status: DC
  Administered 2014-06-10: 1 ug via INTRAVENOUS
  Administered 2014-06-12: 4 ug via INTRAVENOUS
  Administered 2014-06-14 – 2014-06-19 (×3): 1 ug via INTRAVENOUS
  Filled 2014-06-08 (×5): qty 2

## 2014-06-08 MED ORDER — ALBUMIN HUMAN 5 % IV SOLN
INTRAVENOUS | Status: AC
  Filled 2014-06-08: qty 250

## 2014-06-08 MED ORDER — PIPERACILLIN-TAZOBACTAM 3.375 G IVPB 30 MIN
3.3750 g | Freq: Once | INTRAVENOUS | Status: DC
  Filled 2014-06-08: qty 50

## 2014-06-08 MED ORDER — SODIUM CHLORIDE 0.9 % IV SOLN: 100.0000 mL | INTRAVENOUS | Status: DC | PRN

## 2014-06-08 MED ORDER — PIPERACILLIN-TAZOBACTAM IN DEX 2-0.25 GM/50ML IV SOLN
2.2500 g | Freq: Three times a day (TID) | INTRAVENOUS | Status: DC
  Filled 2014-06-08 (×2): qty 50

## 2014-06-08 MED ORDER — DOCUSATE SODIUM 100 MG PO CAPS
100.0000 mg | ORAL_CAPSULE | Freq: Two times a day (BID) | ORAL | Status: DC
  Administered 2014-06-08 – 2014-06-19 (×21): 100 mg via ORAL
  Filled 2014-06-08 (×25): qty 1

## 2014-06-08 MED ORDER — MIDAZOLAM HCL 5 MG/5ML IJ SOLN
INTRAMUSCULAR | Status: DC | PRN
  Administered 2014-06-08 (×2): 2 mg via INTRAVENOUS

## 2014-06-08 MED ORDER — OXYCODONE HCL 5 MG PO TABS
5.0000 mg | ORAL_TABLET | ORAL | Status: DC | PRN
  Administered 2014-06-08 – 2014-06-12 (×8): 10 mg via ORAL
  Administered 2014-06-12: 5 mg via ORAL
  Administered 2014-06-12 – 2014-06-16 (×10): 10 mg via ORAL
  Administered 2014-06-17: 5 mg via ORAL
  Administered 2014-06-18: 10 mg via ORAL
  Filled 2014-06-08 (×14): qty 2
  Filled 2014-06-08: qty 1
  Filled 2014-06-08 (×4): qty 2

## 2014-06-08 MED ORDER — PHENYLEPHRINE HCL 10 MG/ML IJ SOLN
10.0000 mg | INTRAVENOUS | Status: DC | PRN
  Administered 2014-06-08: 50 ug/min via INTRAVENOUS

## 2014-06-08 MED ORDER — PHENYLEPHRINE HCL 10 MG/ML IJ SOLN
INTRAMUSCULAR | Status: DC | PRN
  Administered 2014-06-08 (×6): 80 ug via INTRAVENOUS

## 2014-06-08 MED ORDER — VANCOMYCIN HCL 1000 MG IV SOLR
INTRAVENOUS | Status: DC | PRN
  Administered 2014-06-08: 2 g

## 2014-06-08 MED ORDER — ASPIRIN EC 325 MG PO TBEC
325.0000 mg | DELAYED_RELEASE_TABLET | Freq: Every day | ORAL | Status: DC
  Administered 2014-06-08 – 2014-06-19 (×12): 325 mg via ORAL
  Filled 2014-06-08 (×12): qty 1

## 2014-06-08 MED ORDER — NEPRO/CARBSTEADY PO LIQD
237.0000 mL | ORAL | Status: DC | PRN
  Filled 2014-06-08: qty 237

## 2014-06-08 MED ORDER — PHENYLEPHRINE HCL 10 MG/ML IJ SOLN
0.0000 ug/min | INTRAVENOUS | Status: DC
  Administered 2014-06-08: 20 ug/min via INTRAVENOUS
  Filled 2014-06-08: qty 1

## 2014-06-08 MED ORDER — VANCOMYCIN HCL IN DEXTROSE 1-5 GM/200ML-% IV SOLN
1000.0000 mg | INTRAVENOUS | Status: DC
  Administered 2014-06-08: 1000 mg via INTRAVENOUS
  Filled 2014-06-08 (×2): qty 200

## 2014-06-08 MED ORDER — CHLORHEXIDINE GLUCONATE 4 % EX LIQD: 60.0000 mL | Freq: Once | CUTANEOUS | Status: DC

## 2014-06-08 MED ORDER — PENTAFLUOROPROP-TETRAFLUOROETH EX AERO: 1.0000 "application " | INHALATION_SPRAY | CUTANEOUS | Status: DC | PRN

## 2014-06-08 MED ORDER — ALTEPLASE 2 MG IJ SOLR
2.0000 mg | Freq: Once | INTRAMUSCULAR | Status: AC | PRN
  Filled 2014-06-08: qty 2

## 2014-06-08 MED ORDER — ONDANSETRON HCL 4 MG/2ML IJ SOLN
INTRAMUSCULAR | Status: DC | PRN
  Administered 2014-06-08: 4 mg via INTRAVENOUS

## 2014-06-08 MED ORDER — METOCLOPRAMIDE HCL 5 MG/ML IJ SOLN
5.0000 mg | Freq: Three times a day (TID) | INTRAMUSCULAR | Status: DC | PRN
  Filled 2014-06-08: qty 2

## 2014-06-08 MED ORDER — CALCIUM ACETATE (PHOS BINDER) 667 MG PO CAPS
667.0000 mg | ORAL_CAPSULE | Freq: Three times a day (TID) | ORAL | Status: DC
  Administered 2014-06-09 – 2014-06-19 (×28): 667 mg via ORAL
  Filled 2014-06-08 (×31): qty 1

## 2014-06-08 MED ORDER — ONDANSETRON HCL 4 MG/2ML IJ SOLN
INTRAMUSCULAR | Status: AC
  Filled 2014-06-08: qty 2

## 2014-06-08 MED ORDER — PIPERACILLIN-TAZOBACTAM 3.375 G IVPB 30 MIN
3.3750 g | INTRAVENOUS | Status: AC
  Administered 2014-06-08: 3.375 g via INTRAVENOUS
  Filled 2014-06-08: qty 50

## 2014-06-08 MED ORDER — TRAMADOL HCL 50 MG PO TABS: 50.0000 mg | ORAL_TABLET | Freq: Four times a day (QID) | ORAL | Status: DC | PRN

## 2014-06-08 MED ORDER — ONDANSETRON HCL 4 MG/2ML IJ SOLN
4.0000 mg | Freq: Four times a day (QID) | INTRAMUSCULAR | Status: DC | PRN
  Filled 2014-06-08: qty 2

## 2014-06-08 MED ORDER — METOCLOPRAMIDE HCL 5 MG PO TABS
5.0000 mg | ORAL_TABLET | Freq: Three times a day (TID) | ORAL | Status: DC | PRN
  Filled 2014-06-08: qty 2

## 2014-06-08 MED ORDER — HYDROMORPHONE HCL 1 MG/ML IJ SOLN
0.2500 mg | INTRAMUSCULAR | Status: DC | PRN
  Administered 2014-06-08 (×2): 0.5 mg via INTRAVENOUS

## 2014-06-08 MED ORDER — RENA-VITE PO TABS
1.0000 | ORAL_TABLET | Freq: Every day | ORAL | Status: DC
  Administered 2014-06-08 – 2014-06-18 (×11): 1 via ORAL
  Filled 2014-06-08 (×12): qty 1

## 2014-06-08 MED ORDER — HYDROMORPHONE HCL 1 MG/ML IJ SOLN
INTRAMUSCULAR | Status: AC
  Administered 2014-06-08: 0.5 mg via INTRAVENOUS
  Filled 2014-06-08: qty 1

## 2014-06-08 MED ORDER — LIDOCAINE-PRILOCAINE 2.5-2.5 % EX CREA
1.0000 "application " | TOPICAL_CREAM | CUTANEOUS | Status: DC | PRN
  Filled 2014-06-08: qty 5

## 2014-06-08 MED ORDER — PREDNISONE 5 MG PO TABS
5.0000 mg | ORAL_TABLET | Freq: Every day | ORAL | Status: DC
Start: 2014-06-08 — End: 2014-06-08

## 2014-06-08 MED ORDER — FENTANYL CITRATE (PF) 100 MCG/2ML IJ SOLN
INTRAMUSCULAR | Status: DC | PRN
  Administered 2014-06-08: 25 ug via INTRAVENOUS
  Administered 2014-06-08: 50 ug via INTRAVENOUS
  Administered 2014-06-08: 25 ug via INTRAVENOUS
  Administered 2014-06-08: 50 ug via INTRAVENOUS
  Administered 2014-06-08: 25 ug via INTRAVENOUS

## 2014-06-08 MED ORDER — SODIUM CHLORIDE 0.9 % IV SOLN
500.0000 mg | INTRAVENOUS | Status: DC
  Administered 2014-06-09: 500 mg via INTRAVENOUS
  Filled 2014-06-08 (×2): qty 0.5

## 2014-06-08 MED ORDER — ISOSORBIDE MONONITRATE ER 60 MG PO TB24
90.0000 mg | ORAL_TABLET | Freq: Every day | ORAL | Status: DC
  Filled 2014-06-08: qty 1

## 2014-06-08 MED ORDER — ALBUMIN HUMAN 5 % IV SOLN
12.5000 g | Freq: Once | INTRAVENOUS | Status: AC
  Administered 2014-06-08: 12.5 g via INTRAVENOUS

## 2014-06-08 MED ORDER — AMLODIPINE BESYLATE 2.5 MG PO TABS
2.5000 mg | ORAL_TABLET | Freq: Every day | ORAL | Status: DC
  Filled 2014-06-08: qty 1

## 2014-06-08 MED ORDER — ONDANSETRON HCL 4 MG PO TABS
4.0000 mg | ORAL_TABLET | Freq: Four times a day (QID) | ORAL | Status: DC | PRN
  Filled 2014-06-08: qty 1

## 2014-06-08 MED ORDER — TOBRAMYCIN SULFATE 80 MG/2ML IJ SOLN
INTRAMUSCULAR | Status: DC | PRN
  Administered 2014-06-08: 320 mg

## 2014-06-08 MED ORDER — SODIUM CHLORIDE 0.9 % IR SOLN
Status: DC | PRN
  Administered 2014-06-08: 3000 mL

## 2014-06-08 MED ORDER — HYDRALAZINE HCL 50 MG PO TABS
100.0000 mg | ORAL_TABLET | Freq: Three times a day (TID) | ORAL | Status: DC
  Filled 2014-06-08 (×5): qty 2

## 2014-06-08 MED ORDER — EPHEDRINE SULFATE 50 MG/ML IJ SOLN
INTRAMUSCULAR | Status: AC
  Filled 2014-06-08: qty 1

## 2014-06-08 MED ORDER — EPHEDRINE SULFATE 50 MG/ML IJ SOLN
INTRAMUSCULAR | Status: DC | PRN
  Administered 2014-06-08: 10 mg via INTRAVENOUS
  Administered 2014-06-08 (×2): 20 mg via INTRAVENOUS

## 2014-06-08 MED ORDER — PHENYLEPHRINE HCL 10 MG/ML IJ SOLN
INTRAMUSCULAR | Status: AC
  Filled 2014-06-08: qty 1

## 2014-06-08 MED ORDER — LIDOCAINE HCL (CARDIAC) 20 MG/ML IV SOLN
INTRAVENOUS | Status: AC
  Filled 2014-06-08: qty 5

## 2014-06-08 MED ORDER — ROCURONIUM BROMIDE 50 MG/5ML IV SOLN
INTRAVENOUS | Status: AC
  Filled 2014-06-08: qty 1

## 2014-06-08 MED ORDER — LIDOCAINE HCL (PF) 1 % IJ SOLN: 5.0000 mL | INTRAMUSCULAR | Status: DC | PRN

## 2014-06-08 MED ORDER — MORPHINE SULFATE 2 MG/ML IJ SOLN
1.0000 mg | INTRAMUSCULAR | Status: DC | PRN
  Administered 2014-06-08: 2 mg via INTRAVENOUS
  Filled 2014-06-08: qty 1

## 2014-06-08 MED ORDER — SODIUM CHLORIDE 0.9 % IV SOLN
INTRAVENOUS | Status: DC
  Administered 2014-06-08 (×2): via INTRAVENOUS

## 2014-06-08 MED ORDER — HYDROCORTISONE NA SUCCINATE PF 100 MG IJ SOLR
50.0000 mg | Freq: Four times a day (QID) | INTRAMUSCULAR | Status: DC
  Administered 2014-06-08 – 2014-06-09 (×3): 50 mg via INTRAVENOUS
  Filled 2014-06-08: qty 1
  Filled 2014-06-08: qty 2
  Filled 2014-06-08 (×2): qty 1
  Filled 2014-06-08: qty 2
  Filled 2014-06-08: qty 1
  Filled 2014-06-08: qty 2

## 2014-06-08 MED ORDER — FENTANYL CITRATE (PF) 250 MCG/5ML IJ SOLN
INTRAMUSCULAR | Status: AC
  Filled 2014-06-08: qty 5

## 2014-06-08 MED ORDER — SODIUM CHLORIDE 0.9 % IJ SOLN
INTRAMUSCULAR | Status: AC
  Filled 2014-06-08: qty 10

## 2014-06-08 MED ORDER — ENOXAPARIN SODIUM 30 MG/0.3ML ~~LOC~~ SOLN
30.0000 mg | SUBCUTANEOUS | Status: DC
  Administered 2014-06-09 – 2014-06-11 (×3): 30 mg via SUBCUTANEOUS
  Filled 2014-06-08 (×4): qty 0.3

## 2014-06-08 MED ORDER — METOLAZONE 2.5 MG PO TABS: 2.5000 mg | ORAL_TABLET | Freq: Every day | ORAL | Status: DC

## 2014-06-08 MED ORDER — VANCOMYCIN HCL 1000 MG IV SOLR
INTRAVENOUS | Status: AC
  Filled 2014-06-08: qty 4000

## 2014-06-08 MED ORDER — PRAVASTATIN SODIUM 40 MG PO TABS
40.0000 mg | ORAL_TABLET | Freq: Every day | ORAL | Status: DC
Start: 2014-06-08 — End: 2014-06-19
  Administered 2014-06-08 – 2014-06-18 (×11): 40 mg via ORAL
  Filled 2014-06-08 (×11): qty 1

## 2014-06-08 MED ORDER — OXYCODONE HCL 5 MG PO TABS
ORAL_TABLET | ORAL | Status: AC
  Filled 2014-06-08: qty 2

## 2014-06-08 MED ORDER — ACETAMINOPHEN 325 MG PO TABS
650.0000 mg | ORAL_TABLET | Freq: Four times a day (QID) | ORAL | Status: DC | PRN
  Administered 2014-06-12: 650 mg via ORAL
  Filled 2014-06-08: qty 2

## 2014-06-08 MED ORDER — TOBRAMYCIN SULFATE 80 MG/2ML IJ SOLN
INTRAMUSCULAR | Status: AC
  Filled 2014-06-08: qty 8

## 2014-06-08 MED ORDER — ALLOPURINOL 100 MG PO TABS
100.0000 mg | ORAL_TABLET | Freq: Two times a day (BID) | ORAL | Status: DC
  Administered 2014-06-08 – 2014-06-19 (×21): 100 mg via ORAL
  Filled 2014-06-08 (×22): qty 1

## 2014-06-08 SURGICAL SUPPLY — 50 items
BANDAGE ELASTIC 6 VELCRO ST LF (GAUZE/BANDAGES/DRESSINGS) ×2 IMPLANT
BLADE SURG 10 STRL SS (BLADE) ×3 IMPLANT
BNDG COHESIVE 4X5 TAN STRL (GAUZE/BANDAGES/DRESSINGS) ×3 IMPLANT
BNDG GAUZE ELAST 4 BULKY (GAUZE/BANDAGES/DRESSINGS) ×4 IMPLANT
BNDG GAUZE STRTCH 6 (GAUZE/BANDAGES/DRESSINGS) ×9 IMPLANT
BRUSH FEMORAL CANAL (MISCELLANEOUS) ×2 IMPLANT
BRUSH SCRUB DISP (MISCELLANEOUS) ×6 IMPLANT
COVER SURGICAL LIGHT HANDLE (MISCELLANEOUS) ×6 IMPLANT
DRAPE INCISE IOBAN 66X45 STRL (DRAPES) ×2 IMPLANT
DRAPE PROXIMA HALF (DRAPES) ×2 IMPLANT
DRAPE U-SHAPE 47X51 STRL (DRAPES) ×3 IMPLANT
DRSG ADAPTIC 3X8 NADH LF (GAUZE/BANDAGES/DRESSINGS) ×3 IMPLANT
DRSG PAD ABDOMINAL 8X10 ST (GAUZE/BANDAGES/DRESSINGS) ×2 IMPLANT
ELECT CAUTERY BLADE 6.4 (BLADE) IMPLANT
ELECT REM PT RETURN 9FT ADLT (ELECTROSURGICAL)
ELECTRODE REM PT RTRN 9FT ADLT (ELECTROSURGICAL) IMPLANT
GAUZE SPONGE 4X4 12PLY STRL (GAUZE/BANDAGES/DRESSINGS) ×3 IMPLANT
GLOVE BIO SURGEON STRL SZ7.5 (GLOVE) ×3 IMPLANT
GLOVE BIO SURGEON STRL SZ8 (GLOVE) ×3 IMPLANT
GLOVE BIOGEL PI IND STRL 7.5 (GLOVE) ×1 IMPLANT
GLOVE BIOGEL PI IND STRL 8 (GLOVE) ×1 IMPLANT
GLOVE BIOGEL PI INDICATOR 7.5 (GLOVE) ×2
GLOVE BIOGEL PI INDICATOR 8 (GLOVE) ×2
GOWN STRL REUS W/ TWL LRG LVL3 (GOWN DISPOSABLE) ×2 IMPLANT
GOWN STRL REUS W/ TWL XL LVL3 (GOWN DISPOSABLE) ×1 IMPLANT
GOWN STRL REUS W/TWL LRG LVL3 (GOWN DISPOSABLE) ×6
GOWN STRL REUS W/TWL XL LVL3 (GOWN DISPOSABLE) ×3
HANDPIECE INTERPULSE COAX TIP (DISPOSABLE)
KIT BASIN OR (CUSTOM PROCEDURE TRAY) ×3 IMPLANT
KIT ROOM TURNOVER OR (KITS) ×3 IMPLANT
KIT STIMULAN RAPID CURE  10CC (Orthopedic Implant) ×4 IMPLANT
KIT STIMULAN RAPID CURE 10CC (Orthopedic Implant) IMPLANT
MANIFOLD NEPTUNE II (INSTRUMENTS) ×3 IMPLANT
NS IRRIG 1000ML POUR BTL (IV SOLUTION) ×3 IMPLANT
PACK ORTHO EXTREMITY (CUSTOM PROCEDURE TRAY) ×3 IMPLANT
PAD ARMBOARD 7.5X6 YLW CONV (MISCELLANEOUS) ×6 IMPLANT
PADDING CAST COTTON 6X4 STRL (CAST SUPPLIES) ×3 IMPLANT
SET HNDPC FAN SPRY TIP SCT (DISPOSABLE) IMPLANT
SPONGE GAUZE 4X4 12PLY STER LF (GAUZE/BANDAGES/DRESSINGS) ×2 IMPLANT
SPONGE LAP 18X18 X RAY DECT (DISPOSABLE) ×3 IMPLANT
STOCKINETTE IMPERVIOUS 9X36 MD (GAUZE/BANDAGES/DRESSINGS) ×3 IMPLANT
SUT PDS AB 2-0 CT1 27 (SUTURE) IMPLANT
TOWEL OR 17X24 6PK STRL BLUE (TOWEL DISPOSABLE) ×3 IMPLANT
TOWEL OR 17X26 10 PK STRL BLUE (TOWEL DISPOSABLE) ×6 IMPLANT
TUBE ANAEROBIC SPECIMEN COL (MISCELLANEOUS) IMPLANT
TUBE CONNECTING 12'X1/4 (SUCTIONS) ×1
TUBE CONNECTING 12X1/4 (SUCTIONS) ×2 IMPLANT
UNDERPAD 30X30 INCONTINENT (UNDERPADS AND DIAPERS) ×3 IMPLANT
WATER STERILE IRR 1000ML POUR (IV SOLUTION) ×3 IMPLANT
YANKAUER SUCT BULB TIP NO VENT (SUCTIONS) ×3 IMPLANT

## 2014-06-08 NOTE — Anesthesia Preprocedure Evaluation (Signed)
Anesthesia Evaluation  Patient identified by MRN, date of birth, ID band Patient awake    Reviewed: Allergy & Precautions, H&P , NPO status , Patient's Chart, lab work & pertinent test results, reviewed documented beta blocker date and time   Airway Mallampati: II  TM Distance: >3 FB Neck ROM: Full    Dental no notable dental hx. (+) Teeth Intact, Dental Advisory Given   Pulmonary sleep apnea ,  breath sounds clear to auscultation  Pulmonary exam normal       Cardiovascular hypertension, Pt. on medications and Pt. on home beta blockers +CHF + Valvular Problems/Murmurs AS Rhythm:Regular Rate:Normal     Neuro/Psych negative neurological ROS  negative psych ROS   GI/Hepatic GERD-  Medicated,(+) Hepatitis -, C  Endo/Other  negative endocrine ROS  Renal/GU ESRF and DialysisRenal disease  negative genitourinary   Musculoskeletal   Abdominal   Peds  Hematology negative hematology ROS (+)   Anesthesia Other Findings   Reproductive/Obstetrics negative OB ROS                             Anesthesia Physical Anesthesia Plan  ASA: III  Anesthesia Plan: General   Post-op Pain Management:    Induction: Intravenous  Airway Management Planned: LMA and Oral ETT  Additional Equipment:   Intra-op Plan:   Post-operative Plan: Extubation in OR  Informed Consent: I have reviewed the patients History and Physical, chart, labs and discussed the procedure including the risks, benefits and alternatives for the proposed anesthesia with the patient or authorized representative who has indicated his/her understanding and acceptance.   Dental advisory given  Plan Discussed with: CRNA  Anesthesia Plan Comments:         Anesthesia Quick Evaluation

## 2014-06-08 NOTE — Progress Notes (Signed)
BP 72/53. Pt drowsy but oriented. Dr.Fitzgerald notified. Albumin 5% 250cc ordered. Will give and cont to monitor.

## 2014-06-08 NOTE — Progress Notes (Signed)
[name] PA notified of pt status. States he will upgrade pt to ICU and notify critical care.

## 2014-06-08 NOTE — Progress Notes (Signed)
BP remains low after first albumin. Dr.Fitzgerald at bedside. Second albumin ordered. Will give and cont to monitor.

## 2014-06-08 NOTE — Progress Notes (Signed)
ANTIBIOTIC CONSULT NOTE   Pharmacy Consult for meropenem Indication: infected left distal femur  No Known Allergies  Patient Measurements: Height: 6' (182.9 cm) Weight: 235 lb (106.595 kg) IBW/kg (Calculated) : 77.6   Vital Signs: Temp: 97.6 F (36.4 C) (05/12 1745) Temp Source: Oral (05/12 1600) BP: 107/67 mmHg (05/12 1817) Pulse Rate: 72 (05/12 1817) Intake/Output from previous day:   Intake/Output from this shift: Total I/O In: 1150 [I.V.:650; IV Piggyback:500] Out: 175 [Drains:100; Blood:75]  Labs:  Recent Labs  06/08/14 0948 06/08/14 1700  WBC 11.9*  --   HGB 13.9 10.2*  PLT 140*  --   CREATININE 3.42*  --    Estimated Creatinine Clearance: 30.1 mL/min (by C-G formula based on Cr of 3.42). No results for input(s): VANCOTROUGH, VANCOPEAK, VANCORANDOM, GENTTROUGH, GENTPEAK, GENTRANDOM, TOBRATROUGH, TOBRAPEAK, TOBRARND, AMIKACINPEAK, AMIKACINTROU, AMIKACIN in the last 72 hours.   Microbiology: Recent Results (from the past 720 hour(s))  Gram stain     Status: None   Collection Time: 06/08/14 11:55 AM  Result Value Ref Range Status   Specimen Description TISSUE THIGH LEFT  Final   Special Requests NONE  Final   Gram Stain   Final    ABUNDANT WBC PRESENT,BOTH PMN AND MONONUCLEAR ABUNDANT GRAM POSITIVE COCCI IN PAIRS IN CLUSTERS Gram Stain Report Called to,Read Back By and Verified With: Mendel Corning RN 13:20 06/08/14 ([email or web address])    Report Status 06/08/2014 FINAL  Final  Gram stain     Status: None   Collection Time: 06/08/14  1:58 PM  Result Value Ref Range Status   Specimen Description TISSUE  Final   Special Requests REAMINGS OF LEFT FEMUR  Final   Gram Stain   Final    ABUNDANT WBC PRESENT,BOTH PMN AND MONONUCLEAR NO ORGANISMS SEEN    Report Status 06/08/2014 FINAL  Final    Medical History: Past Medical History  Diagnosis Date  . CKD (chronic kidney disease), stage IV     a. s/p renal transplant 1996.  Marland Kitchen HLD (hyperlipidemia)     takes Pravastatin  daily  . Nonischemic cardiomyopathy     a. unknown etiology, EF 30-35% by echo 09/2011;  b. 09/2011 Lexi MV EF 42%, no ischemia/infarct.  . Systolic CHF     takes Lasix daily  . Atrial flutter     takes Amiodarone daily  . Gout     takes Allopurinol daily  . Hypertension     takes Amlodipine,Hydralazine,Imdur,and Coreg daily  . GERD (gastroesophageal reflux disease)     takes Protonix daily  . Shortness of breath dyspnea     exertion  . Pneumonia 2014  . History of bronchitis     several yrs ago  . Joint pain     right shoulder and left knee  . Sciatica   . Hepatitis C 1996    Hep C  . History of colon polyps   . History of kidney stones   . Urinary frequency   . History of shingles   . Hyperparathyroidism due to renal insufficiency   . Sleep apnea     does not use cpap  . Wound of left leg     Medications:  Prescriptions prior to admission  Medication Sig Dispense Refill Last Dose  . allopurinol (ZYLOPRIM) 100 MG tablet Take 1 tablet (100 mg total) by mouth 2 (two) times daily. 60 tablet 1 06/08/2014 at 0700  . amiodarone (PACERONE) 100 MG tablet TAKE 1 TABLET BY MOUTH EVERY DAY 30 tablet 6 06/08/2014  at 0700  . amiodarone (PACERONE) 100 MG tablet Take 1 tablet (100 mg total) by mouth daily. 30 tablet 0   . amLODipine (NORVASC) 2.5 MG tablet TAKE 1 TABLET (2.5 MG TOTAL) BY MOUTH DAILY. 30 tablet 6 06/08/2014 at 0700  . amLODipine (NORVASC) 2.5 MG tablet TAKE 1 TABLET (2.5 MG TOTAL) BY MOUTH DAILY. 30 tablet 6   . amoxicillin-clavulanate (AUGMENTIN) 500-125 MG per tablet TAKE 1 TABLET BY MOUTH TWICE A DAY 60 tablet 3 06/07/2014 at Unknown time  . aspirin 325 MG tablet Take 325 mg by mouth daily.   06/07/2014 at Unknown time  . calcitRIOL (ROCALTROL) 0.5 MCG capsule Take 0.5 mcg by mouth daily.  11 06/07/2014 at Unknown time  . carvedilol (COREG) 25 MG tablet TAKE 1 TABLET TWICE DAILY WITH A MEAL 60 tablet 3 06/08/2014 at 0700  . Cholecalciferol (VITAMIN D-3) 5000 UNITS TABS Take  5,000 Units by mouth daily.    06/07/2014 at Unknown time  . docusate sodium (COLACE) 100 MG capsule Take 1 capsule (100 mg total) by mouth 2 (two) times daily. 10 capsule 0 06/07/2014 at Unknown time  . hydrALAZINE (APRESOLINE) 100 MG tablet TAKE 1 TABLET (100 MG TOTAL) BY MOUTH 3 (THREE) TIMES DAILY. 90 tablet 3 06/08/2014 at 0700  . isosorbide mononitrate (IMDUR) 60 MG 24 hr tablet TAKE 1.5 TABLETS BY MOUTH EVERY DAY 45 tablet 2 06/08/2014 at 0700  . metolazone (ZAROXOLYN) 2.5 MG tablet TAKE 1 TABLET BY MOUTH 30 MINUTES BEFORE YOUR MORNING LASIX ON TUES, THURS, AND SAT MORNINGS 15 tablet 3 not taking  . oxyCODONE (OXY IR/ROXICODONE) 5 MG immediate release tablet Take 1-2 tablets (5-10 mg total) by mouth every 3 (three) hours as needed for breakthrough pain. 70 tablet 0 06/07/2014 at Unknown time  . pravastatin (PRAVACHOL) 40 MG tablet TAKE 1 TABLET (40 MG TOTAL) BY MOUTH DAILY. 30 tablet 6 06/07/2014 at Unknown time  . predniSONE (DELTASONE) 10 MG tablet Take 1 tablet (10 mg total) by mouth daily. (Patient taking differently: Take 10 mg by mouth daily. Pt. Taking 1/2 tablet daily) 30 tablet 1 06/07/2014 at Unknown time  . Probiotic Product (PROBIOTIC DAILY PO) Take 1 capsule by mouth daily.    06/07/2014 at Unknown time  . traMADol (ULTRAM) 50 MG tablet Take 1 tablet (50 mg total) by mouth every 6 (six) hours as needed (pain). As needed for pain 30 tablet 0 not taking  . acetaminophen (TYLENOL) 325 MG tablet Take 2 tablets (650 mg total) by mouth every 6 (six) hours as needed for moderate pain or fever.   More than a month at Unknown time   Assessment: 59 yo black male with chronic infection of left distal femur. Pt has numerous comorbidities including CKD s/p renal transplant, cardiomyopathy, CHF Hep c. Currently with purulent drainage from his left leg. For I&D of femur in OR 06/08/14. Dialysis on MWF  Goal of Therapy:  Resolution of osteomyelitis preHD vanc level 20-25 mg/L  Plan:  Follow up culture  results  Received 1gm vanc preop at 11:05.  Will give additional 1 gm vanc for total load of 2 gm and start 1 gm IV after each HD tomorrow.   Meropenem 500 mg IV now then 500 mg after each HD  Talbert Cage, PharmD

## 2014-06-08 NOTE — H&P (Signed)
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 Pt with numerous medical comorbidities including CKD s/p renal transplant, Cardiomyopathy, CHF, HEP C. Pt Had surgery in feb 2016 to address his nonunion and osteo. Hardware was removed at that time and abx beads were placed. Pt did have a prolonged hospitalization at that time due to his medical issues. Pt was eventually dc'd to snf. He was followed closely at the office.  He presented to the office yesterday with purulent drainage from his L leg.  Pt presents to hospital today for I&D of his L thigh/distal femur.    Pt on dialysis MWF   Wheel chair bound at this time  Has chronic venous insufficiency B LEx, followed by Dr. Lajoyce Corners   Past Medical History  Diagnosis Date  . CKD (chronic kidney disease), stage IV     a. s/p renal transplant 1996.  Marland Kitchen HLD (hyperlipidemia)     takes Pravastatin daily  . Nonischemic cardiomyopathy     a. unknown etiology, EF 30-35% by echo 09/2011;  b. 09/2011 Lexi MV EF 42%, no ischemia/infarct.  . Systolic CHF     takes Lasix daily  . Atrial flutter     takes Amiodarone daily  . Gout     takes Allopurinol daily  . Hypertension     takes Amlodipine,Hydralazine,Imdur,and Coreg daily  . GERD (gastroesophageal reflux disease)     takes Protonix daily  . Shortness of breath dyspnea     exertion  . Pneumonia 2014  . History of bronchitis     several yrs ago  . Joint pain     right shoulder and left knee  . Sciatica   . Hepatitis C 1996    Hep C  . History of colon polyps   . History of kidney stones   . Urinary frequency   . History of shingles   . Hyperparathyroidism due to renal insufficiency   . Sleep apnea     does not use cpap  . Wound of left leg     Past Surgical History  Procedure Laterality Date  . Nephrectomy transplanted organ Right 25yrs  ago  . Insertion of dialysis catheter  06/20/2011    Procedure: INSERTION OF DIALYSIS CATHETER;  Surgeon: [name], MD;  Location: [facility] OR;  Service: Vascular;  Laterality: Right;  Ultrasound guided insertion of right internal jugular dialysis catheter  . Multiple extractions with alveoloplasty  06/27/2011    Procedure: MULTIPLE EXTRACION WITH ALVEOLOPLASTY;  Surgeon: [name], DDS;  Location: [facility] OR;  Service: Oral Surgery;  Laterality: N/A;  Extraction  of tooth # 14 with alveoloplasty  . Hip surgery Left     replacement  . Joint replacement      left hip  . Femur fracture surgery Left 07/09/2012    Dr Deno Etienne  . Femur im nail Left 07/10/2012    ORIF L distal femur;  Surgeon: Toni Arthurs, MD;  Location: [facility] OR;  Service: Orthopedics;  Laterality: Left;  . I&d extremity Left 08/24/2012    Procedure: LEFT LEG IRRIGATION AND DEBRIDEMENT WITH POSSIBLE WOUND VAC APPLICATION;  Surgeon: Toni Arthurs, MD;  Location: [facility] OR;  Service: Orthopedics;  Laterality: Left;  . Tracheostomy  2014  . Tracheostomy closure  2014    2 wks after it was done  . Colonsocopy    .  Non union Left 03/14/2014    LEFT FEMER REPAIR     BY DR HANDY  . Hardware removal Left 03/14/2014    Procedure: HARDWARE REMOVAL LEFT;  Surgeon: Myrene Galas, MD;  Location: [facility] OR;  Service: Orthopedics;  Laterality: Left;  . Wound debridement Left 03/14/2014    Procedure: DEBRIDEMENT WOUND;  Surgeon: Myrene Galas, MD;  Location: [facility] OR;  Service: Orthopedics;  Laterality: Left;  INSERTION OF  ANTIBIOTIC Beads  (2 strands: 15 beads and 14 beads)  . Av fistula placement Left 06/01/2014    Procedure: LEFT BRACHIOCEPHALIC ARTERIOVENOUS (AV) FISTULA CREATION;  Surgeon: Pryor Ochoa, MD;  Location: [facility] OR;  Service: Vascular;  Laterality: Left;    Family History  Problem Relation Age of Onset  . Heart disease Neg Hx    Social History:  reports that he has never smoked. He has never used smokeless tobacco. He reports that he does not  drink alcohol or use illicit drugs.  Allergies: No Known Allergies  Medications Prior to Admission  Medication Sig Dispense Refill  . acetaminophen (TYLENOL) 325 MG tablet Take 2 tablets (650 mg total) by mouth every 6 (six) hours as needed for moderate pain or fever.    Marland Kitchen allopurinol (ZYLOPRIM) 100 MG tablet Take 1 tablet (100 mg total) by mouth 2 (two) times daily. 60 tablet 1  . amiodarone (PACERONE) 100 MG tablet TAKE 1 TABLET BY MOUTH EVERY DAY 30 tablet 6  . amiodarone (PACERONE) 100 MG tablet Take 1 tablet (100 mg total) by mouth daily. 30 tablet 0  . amLODipine (NORVASC) 2.5 MG tablet TAKE 1 TABLET (2.5 MG TOTAL) BY MOUTH DAILY. 30 tablet 6  . amLODipine (NORVASC) 2.5 MG tablet TAKE 1 TABLET (2.5 MG TOTAL) BY MOUTH DAILY. 30 tablet 6  . amoxicillin-clavulanate (AUGMENTIN) 500-125 MG per tablet TAKE 1 TABLET BY MOUTH TWICE A DAY 60 tablet 3  . aspirin 325 MG tablet Take 325 mg by mouth daily.    . calcitRIOL (ROCALTROL) 0.5 MCG capsule Take 0.5 mcg by mouth daily.  11  . carvedilol (COREG) 25 MG tablet TAKE 1 TABLET TWICE DAILY WITH A MEAL 60 tablet 3  . Cholecalciferol (VITAMIN D-3) 5000 UNITS TABS Take 5,000 Units by mouth daily.     Marland Kitchen docusate sodium (COLACE) 100 MG capsule Take 1 capsule (100 mg total) by mouth 2 (two) times daily. 10 capsule 0  . hydrALAZINE (APRESOLINE) 100 MG tablet TAKE 1 TABLET (100 MG TOTAL) BY MOUTH 3 (THREE) TIMES DAILY. 90 tablet 3  . isosorbide mononitrate (IMDUR) 60 MG 24 hr tablet TAKE 1.5 TABLETS BY MOUTH EVERY DAY 45 tablet 2  . metolazone (ZAROXOLYN) 2.5 MG tablet TAKE 1 TABLET BY MOUTH 30 MINUTES BEFORE YOUR MORNING LASIX ON TUES, THURS, AND SAT MORNINGS 15 tablet 3  . oxyCODONE (OXY IR/ROXICODONE) 5 MG immediate release tablet Take 1-2 tablets (5-10 mg total) by mouth every 3 (three) hours as needed for breakthrough pain. 70 tablet 0  . pravastatin (PRAVACHOL) 40 MG tablet TAKE 1 TABLET (40 MG TOTAL) BY MOUTH DAILY. 30 tablet 6  . predniSONE  (DELTASONE) 10 MG tablet Take 1 tablet (10 mg total) by mouth daily. 30 tablet 1  . Probiotic Product (PROBIOTIC DAILY PO) Take 1 capsule by mouth daily.     . traMADol (ULTRAM) 50 MG tablet Take 1 tablet (50 mg total) by mouth every 6 (six) hours as needed (pain). As needed for pain 30 tablet 0    No results  found for this or any previous visit (from the past 48 hour(s)). No results found.  Review of Systems  Constitutional: Positive for malaise/fatigue. Negative for fever.  Cardiovascular: Negative for chest pain and palpitations.  Gastrointestinal: Negative for nausea and vomiting.  Skin: Negative for itching.    Vitals on arrival to short stay  Physical Exam  Constitutional: He is oriented to person, place, and time.  Chronically ill-appearing black male  Cardiovascular:  S1 and S2  Respiratory:  Decreased air movement throughout  GI:  + Bowel sounds  Musculoskeletal:  Left lower extremity    Purulent drainage from the distal femur incision    Chronic swelling to left leg, Unna boot in place.    Palpable peripheral pulses    Motor and sensory functions grossly intact  Neurological: He is alert and oriented to person, place, and time.  Cooperative     Assessment/Plan   59 year old black male with chronic medical issues with persistent osteomyelitis and wound infection left distal femur  OR for I and D left femur, possible placement of additional antibiotic spacer Admit postop for IV antibiotics Patient will need dialysis on Friday as per his normal schedule Consult medicine and renal services after surgery to assist with medical management This continue to be a life and limb threatening condition given complete clinical picture  Mearl Latin, PA-C [facility] [phone] (P)  06/08/2014, 8:10 AM

## 2014-06-08 NOTE — Transfer of Care (Signed)
Immediate Anesthesia Transfer of Care Note  Patient: Chad [name]  Procedure(s) Performed: Procedure(s): INCISION AND DRAINAGE OF LEFT THIGH (Left)  Patient Location: PACU  Anesthesia Type:General  Level of Consciousness: awake  Airway & Oxygen Therapy: Patient Spontanous Breathing and Patient connected to nasal cannula oxygen  Post-op Assessment: Report given to RN and Post -op Vital signs reviewed and stable  Post vital signs: Reviewed and stable  Last Vitals:  Filed Vitals:   06/08/14 0913  BP: 97/65  Pulse: 65  Temp: 36.5 C  Resp: 20    Complications: No apparent anesthesia complications

## 2014-06-08 NOTE — Progress Notes (Signed)
IV team unsuccessful gaining IV access multiple times. Elink MD notified.

## 2014-06-08 NOTE — Progress Notes (Signed)
Orthopedic Tech Progress Note Patient Details:  Chad [name] 1955/11/19 [id number]  Ortho Devices Type of Ortho Device: Postop shoe/boot Ortho Device/Splint Location: LLE prafo boot Ortho Device/Splint Interventions: Ordered, Application, Adjustment   [name] 06/08/2014, 7:25 PM

## 2014-06-08 NOTE — Progress Notes (Signed)
Per Talbert Cage, pharmacist, it is OK to hold Merrerm until additional IV access is available.

## 2014-06-08 NOTE — Anesthesia Procedure Notes (Signed)
Procedure Name: LMA Insertion Date/Time: 06/08/2014 11:07 AM Performed by: Gavin Pound, Anjalee Cope J Pre-anesthesia Checklist: Patient identified, Timeout performed, Emergency Drugs available, Suction available and Patient being monitored Patient Re-evaluated:Patient Re-evaluated prior to inductionOxygen Delivery Method: Circle system utilized Preoxygenation: Pre-oxygenation with 100% oxygen Intubation Type: IV induction Ventilation: Mask ventilation without difficulty LMA: LMA inserted LMA Size: 5.0 Number of attempts: 1 Placement Confirmation: positive ETCO2 and breath sounds checked- equal and bilateral Tube secured with: Tape

## 2014-06-08 NOTE — Consult Note (Signed)
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 Admitted 5/12 for I&D of L femoral non-union and osteomyelitis. Post procedure was extubated successfully, experienced hypotension unresponsive to colloid x3. Will be admitted to ICU. PCCM consulted to assist with care.   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 He is well known to Ortho service for management of chronic L femoral non-union and osteomyelitis. He underwent hardware removal and implantation of abx beads in 02/2014. On eval in office 5/11 he had purulent drainage from the wound and was scheduled for I&D on 5/12. He was successfuly extubated post-op, has been hypotensive to 70-80's systolic. Received albumin x 3. Phenylephrine to be started. Labs are sent and pending.    PAST MEDICAL HISTORY :   has a past medical history of CKD (chronic kidney disease), stage IV; HLD (hyperlipidemia); Nonischemic cardiomyopathy; Systolic CHF; Atrial flutter; Gout; Hypertension; GERD (gastroesophageal reflux disease); Shortness of breath dyspnea; Pneumonia (2014); History of bronchitis; Joint pain; Sciatica; Hepatitis C (1996); History of colon polyps; History of kidney stones; Urinary frequency; History of shingles; Hyperparathyroidism due to renal insufficiency; Sleep apnea; and Wound of left leg.  has past surgical history that includes Nephrectomy transplanted organ (Right, 24yrs ago); Insertion of dialysis catheter (06/20/2011); Multiple extractions with alveoloplasty  (06/27/2011); Hip surgery (Left); Joint replacement; Femur fracture surgery (Left, 07/09/2012); Femur IM nail (Left, 07/10/2012); I&D extremity (Left, 08/24/2012); Tracheostomy (2014); Tracheostomy closure (2014); colonsocopy; NON UNION (Left, 03/14/2014); Hardware Removal (Left, 03/14/2014); Wound debridement (Left, 03/14/2014); and AV fistula placement (Left, 06/01/2014).   Prior to Admission medications   Medication Sig Start Date End Date Taking? Authorizing Provider  allopurinol (ZYLOPRIM) 100 MG tablet Take 1 tablet (100 mg total) by mouth 2 (two) times daily. 08/05/12  Yes Daniel J Angiulli, PA-C  amiodarone (PACERONE) 100 MG tablet TAKE 1 TABLET BY MOUTH EVERY DAY 09/19/13  Yes Laurey Morale, MD  amiodarone (PACERONE) 100 MG tablet Take 1 tablet (100 mg total) by mouth daily. 06/05/14  Yes Bevelyn Buckles Bensimhon, MD  amLODipine (NORVASC) 2.5 MG tablet TAKE 1 TABLET (2.5 MG TOTAL) BY MOUTH DAILY. 05/08/14  Yes Bevelyn Buckles Bensimhon, MD  amLODipine (NORVASC) 2.5 MG tablet TAKE 1 TABLET (2.5 MG TOTAL) BY MOUTH DAILY. 06/05/14  Yes Dolores Patty, MD  amoxicillin-clavulanate (AUGMENTIN) 500-125 MG per tablet TAKE 1 TABLET BY MOUTH TWICE A DAY 05/25/14  Yes Cliffton Asters, MD  aspirin 325 MG tablet Take 325 mg by mouth daily.   Yes Historical Provider, MD  calcitRIOL (ROCALTROL) 0.5 MCG capsule Take 0.5 mcg by mouth daily. 02/02/14  Yes Historical Provider, MD  carvedilol (COREG) 25 MG tablet TAKE 1 TABLET TWICE DAILY WITH A MEAL 02/27/14  Yes Dolores Patty, MD  Cholecalciferol (VITAMIN D-3) 5000 UNITS TABS Take 5,000 Units by mouth daily.    Yes Historical Provider, MD  docusate sodium (COLACE) 100 MG capsule Take 1 capsule (100 mg total) by mouth 2 (two) times daily. 03/22/14  Yes  Montez Morita, PA-C  hydrALAZINE (APRESOLINE) 100 MG tablet TAKE 1 TABLET (100 MG TOTAL) BY MOUTH 3 (THREE) TIMES DAILY. 02/14/14  Yes Amy D Clegg, NP  isosorbide mononitrate (IMDUR) 60 MG 24 hr tablet TAKE 1.5 TABLETS BY MOUTH EVERY DAY  03/02/14  Yes Bevelyn Buckles Bensimhon, MD  metolazone (ZAROXOLYN) 2.5 MG tablet TAKE 1 TABLET BY MOUTH 30 MINUTES BEFORE YOUR MORNING LASIX ON TUES, THURS, AND SAT MORNINGS 06/05/14  Yes Dolores Patty, MD  oxyCODONE (OXY IR/ROXICODONE) 5 MG immediate release tablet Take 1-2 tablets (5-10 mg total) by mouth every 3 (three) hours as needed for breakthrough pain. 03/22/14  Yes Montez Morita, PA-C  pravastatin (PRAVACHOL) 40 MG tablet TAKE 1 TABLET (40 MG TOTAL) BY MOUTH DAILY.   Yes Laurey Morale, MD  predniSONE (DELTASONE) 10 MG tablet Take 1 tablet (10 mg total) by mouth daily. Patient taking differently: Take 10 mg by mouth daily. Pt. Taking 1/2 tablet daily 08/05/12  Yes Daniel J Angiulli, PA-C  Probiotic Product (PROBIOTIC DAILY PO) Take 1 capsule by mouth daily.    Yes Historical Provider, MD  traMADol (ULTRAM) 50 MG tablet Take 1 tablet (50 mg total) by mouth every 6 (six) hours as needed (pain). As needed for pain 06/01/14  Yes Lars Mage, PA-C  acetaminophen (TYLENOL) 325 MG tablet Take 2 tablets (650 mg total) by mouth every 6 (six) hours as needed for moderate pain or fever. 03/22/14   Montez Morita, PA-C   No Known Allergies  FAMILY HISTORY:  indicated that his mother is deceased. He indicated that his father is deceased.  SOCIAL HISTORY:  reports that he has never smoked. He has never used smokeless tobacco. He reports that he does not drink alcohol or use illicit drugs.  REVIEW OF SYSTEMS:  Unattainable, patient is sedate post op.  SUBJECTIVE:   VITAL SIGNS: Temp:  [97.5 F (36.4 C)-97.7 F (36.5 C)] 97.5 F (36.4 C) (05/12 1505) Pulse Rate:  [65-93] 73 (05/12 1653) Resp:  [12-28] 15 (05/12 1653) BP: (72-113)/(47-99) 86/54 mmHg (05/12 1653) SpO2:  [90 %-100 %] 100 % (05/12 1653) Weight:  [106.595 kg (235 lb)] 106.595 kg (235 lb) (05/12 0913) HEMODYNAMICS:   VENTILATOR SETTINGS:   INTAKE / OUTPUT:  Intake/Output Summary (Last 24 hours) at 06/08/14 1701 Last data filed at  06/08/14 1538  Gross per 24 hour  Intake   1150 ml  Output     75 ml  Net   1075 ml    PHYSICAL EXAMINATION: General:  Chronically ill appearing male, sedate post op but moves all ext to command. Neuro:  Arousable but easily falls back asleep.  Moving all ext to command when awake. HEENT:  [place]/AT, PERRL, EOM-I and MMM. Cardiovascular:  RRR, Nl S1/S2, -M/R/G. Lungs:  Bibasilar crackles. Abdomen:  Soft, NT, ND and +BS. Musculoskeletal:  -edema but diffusely tender, entire left leg is casted and the right leg with ACE wraps around as well, unable to examine wound. Skin:  Unable to examine wound.  But otherwise skin is intact.  LABS:  CBC  Recent Labs Lab 06/08/14 0948  WBC 11.9*  HGB 13.9  HCT 42.5  PLT 140*   Coag's No results for input(s): APTT, INR in the last 168 hours. BMET  Recent Labs Lab 06/08/14 0948  NA 137  K 3.4*  CL 97*  CO2 27  BUN 21*  CREATININE 3.42*  GLUCOSE 77   Electrolytes  Recent Labs Lab 06/08/14 0948  CALCIUM 9.0  Sepsis Markers No results for input(s): LATICACIDVEN, PROCALCITON, O2SATVEN in the last 168 hours. ABG No results for input(s): PHART, PCO2ART, PO2ART in the last 168 hours. Liver Enzymes  Recent Labs Lab 06/08/14 0948  AST 19  ALT 7*  ALKPHOS 65  BILITOT 0.5  ALBUMIN 2.4*   Cardiac Enzymes No results for input(s): TROPONINI, PROBNP in the last 168 hours. Glucose No results for input(s): GLUCAP in the last 168 hours.  Imaging No results found.   ASSESSMENT / PLAN:  PULMONARY OETT 5/12 >> 5/12 A: CXR with low lung volume and atelectasis. P:   Post op pulm hygiene Wean O2 to off as able. IS for atelectasis.  CARDIOVASCULAR CVL A: Shock, consider due to sedating meds, volume / blood losses, but most concerning for septic shock given L femoral infxn Hx idiopathic / non-ischemic CM A fib HTN Suspect shock is due to anesthetic effects. P:  Will give a 500 ml bolus then KVO IVF. Agree with low dose  phenylephrine for now, may need CVC and transition to norepi if needing more. Abx as below Consider Echo if slow to resolve Continue home amiodarone unless becomes bradycardic Hold all HTN meds Stress dose steroids. Check cortisol level.  RENAL A:  CKD on HD Hx failed renal Tx (on chronic pred) P:   Appreciate Renal input Labs pm 5/12 Replace electrolytes as indicated. BMET in AM.  GASTROINTESTINAL A:  Acid prophylaxis P:   PPI Diet when able.  HEMATOLOGIC A:  At risk acute blood loss anemia post-op P:  CBC pre op noted. CBC now. Transfuse per ICU protocol.  INFECTIOUS A:  L distal femoral osteomyelitis P:   BCx2 5/12 >> Tissue 5/12>>>  Vanc 5/12>>> Zosyn 5/12>>>  ENDOCRINE A:  Relative adrenal insufficiency P:   Start hydrocort Check cortisol level.  NEUROLOGIC A:  Lethargic post anesthesia but complains of pain. P:   Narcotics as ordered for pain control. Will attempt to limit given mental status. Monitor closely in the ICU.  FAMILY  - Updates: no family bedside.  TODAY'S SUMMARY: 59 year old male with ESRD-HD who presents to PCCM post op with hypotension requiring pressors.  Will admit to the ICU, place on pressors, if Neo demands increase then will consider placing TLC and placing on levo instead of neo.  Monitor closely in the ICU.  Increase abx coverage to include vanc.  The patient is critically ill with multiple organ systems failure and requires high complexity decision making for assessment and support, frequent evaluation and titration of therapies, application of advanced monitoring technologies and extensive interpretation of multiple databases.   Critical Care Time devoted to patient care services described in this note is  35  Minutes. This time reflects time of care of this signee Dr Koren Bound. This critical care time does not reflect procedure time, or teaching time or supervisory time of PA/NP/Med student/Med Resident etc but could  involve care discussion time.  Alyson Reedy, M.D. [facility] Pulmonary/Critical Care Medicine. Pager: [phone]. After hours pager: [phone].  06/08/2014, 5:01 PM

## 2014-06-08 NOTE — Progress Notes (Signed)
BP remains low. Called Dr.R.Fitzgerald to  Bedside. H&H ordered, 3rd albumin ordered, and neo drip .Marland Kitchen Will give and cont to monitor. Will also notify Dr.Handy.

## 2014-06-08 NOTE — Progress Notes (Signed)
Unable to get UA and culture. Pt. Voided early this morning and stated he would not be able to void again prior to surgery due to him being on dialysis. Dr. Carola Frost notified. Stated it was okay that we didn't get the urine.

## 2014-06-08 NOTE — Progress Notes (Signed)
Pt's left foot cold and dark.  UTA pulse r/t dsg. [name] PA aware-states same as preop.

## 2014-06-08 NOTE — Consult Note (Signed)
Indication for Consultation:  Management of ESRD/hemodialysis; anemia, hypertension/volume and secondary hyperparathyroidism  HPI: Chad [name] is a 59 y.o. male who presented today for I&D of L distal femur. 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 He had surgery in Feb for osteomyelitis and hardware removal. He had antibiotic beads placed at that time. He presented to an orthopedic appt yesterday and had purulent drainage from his wound so the decision was made for I& D today.   When seen in PACU post-op (I&D Left distal femur) he complains of back pain and surgical site pain. He is hypotensive and has been given albumin infusions and is going to be started on Neosynephrine. H/H pending to look for possible ABLA. Awaiting transfer to ICU.  Past Medical History  Diagnosis Date  . CKD (chronic kidney disease), stage IV     a. s/p renal transplant 1996.  Marland Kitchen HLD (hyperlipidemia)     takes Pravastatin daily  . Nonischemic cardiomyopathy     a. unknown etiology, EF 30-35% by echo 09/2011;  b. 09/2011 Lexi MV EF 42%, no ischemia/infarct.  . Systolic CHF     takes Lasix daily  . Atrial flutter     takes Amiodarone daily  . Gout     takes Allopurinol daily  . Hypertension     takes Amlodipine,Hydralazine,Imdur,and Coreg daily  . GERD (gastroesophageal reflux disease)     takes Protonix daily  . Shortness of breath dyspnea     exertion  . Pneumonia 2014  . History of bronchitis     several yrs ago  . Joint pain     right shoulder and left knee  . Sciatica   . Hepatitis C 1996    Hep C  . History of colon polyps   . History of kidney stones   . Urinary frequency   . History of shingles   . Hyperparathyroidism due to renal insufficiency   . Sleep apnea     does not use cpap  . Wound of left leg    Past Surgical History  Procedure Laterality Date  . Nephrectomy transplanted organ Right 35yrs ago  .  Insertion of dialysis catheter  06/20/2011    Procedure: INSERTION OF DIALYSIS CATHETER;  Surgeon: [name], MD;  Location: [facility] OR;  Service: Vascular;  Laterality: Right;  Ultrasound guided insertion of right internal jugular dialysis catheter  . Multiple extractions with alveoloplasty  06/27/2011    Procedure: MULTIPLE EXTRACION WITH ALVEOLOPLASTY;  Surgeon: [name], DDS;  Location: [facility] OR;  Service: Oral Surgery;  Laterality: N/A;  Extraction  of tooth # 14 with alveoloplasty  . Hip surgery Left     replacement  . Joint replacement      left hip  . Femur fracture surgery Left 07/09/2012    Dr Deno Etienne  . Femur im nail Left 07/10/2012    ORIF L distal femur;  Surgeon: Toni Arthurs, MD;  Location: [facility] OR;  Service: Orthopedics;  Laterality: Left;  . I&d extremity Left 08/24/2012    Procedure: LEFT LEG IRRIGATION AND DEBRIDEMENT WITH POSSIBLE WOUND VAC APPLICATION;  Surgeon: Toni Arthurs, MD;  Location: [facility] OR;  Service: Orthopedics;  Laterality: Left;  . Tracheostomy  2014  . Tracheostomy closure  2014    2 wks after it was done  . Colonsocopy    . Non union Left 03/14/2014    LEFT FEMER REPAIR  BY DR HANDY  . Hardware removal Left 03/14/2014    Procedure: HARDWARE REMOVAL LEFT;  Surgeon: Myrene Galas, MD;  Location: [facility] OR;  Service: Orthopedics;  Laterality: Left;  . Wound debridement Left 03/14/2014    Procedure: DEBRIDEMENT WOUND;  Surgeon: Myrene Galas, MD;  Location: [facility] OR;  Service: Orthopedics;  Laterality: Left;  INSERTION OF  ANTIBIOTIC Beads  (2 strands: 15 beads and 14 beads)  . Av fistula placement Left 06/01/2014    Procedure: LEFT BRACHIOCEPHALIC ARTERIOVENOUS (AV) FISTULA CREATION;  Surgeon: Pryor Ochoa, MD;  Location: [facility] OR;  Service: Vascular;  Laterality: Left;   Family History  Problem Relation Age of Onset  . Heart disease Neg Hx    Social History:  reports that he has never smoked. He has never used smokeless tobacco. He reports that he does not drink  alcohol or use illicit drugs. No Known Allergies Prior to Admission medications   Medication Sig Start Date End Date Taking? Authorizing Provider  allopurinol (ZYLOPRIM) 100 MG tablet Take 1 tablet (100 mg total) by mouth 2 (two) times daily. 08/05/12  Yes Daniel J Angiulli, PA-C  amiodarone (PACERONE) 100 MG tablet TAKE 1 TABLET BY MOUTH EVERY DAY 09/19/13  Yes Laurey Morale, MD  amiodarone (PACERONE) 100 MG tablet Take 1 tablet (100 mg total) by mouth daily. 06/05/14  Yes Bevelyn Buckles Bensimhon, MD  amLODipine (NORVASC) 2.5 MG tablet TAKE 1 TABLET (2.5 MG TOTAL) BY MOUTH DAILY. 05/08/14  Yes Bevelyn Buckles Bensimhon, MD  amLODipine (NORVASC) 2.5 MG tablet TAKE 1 TABLET (2.5 MG TOTAL) BY MOUTH DAILY. 06/05/14  Yes Dolores Patty, MD  amoxicillin-clavulanate (AUGMENTIN) 500-125 MG per tablet TAKE 1 TABLET BY MOUTH TWICE A DAY 05/25/14  Yes Cliffton Asters, MD  aspirin 325 MG tablet Take 325 mg by mouth daily.   Yes Historical Provider, MD  calcitRIOL (ROCALTROL) 0.5 MCG capsule Take 0.5 mcg by mouth daily. 02/02/14  Yes Historical Provider, MD  carvedilol (COREG) 25 MG tablet TAKE 1 TABLET TWICE DAILY WITH A MEAL 02/27/14  Yes Dolores Patty, MD  Cholecalciferol (VITAMIN D-3) 5000 UNITS TABS Take 5,000 Units by mouth daily.    Yes Historical Provider, MD  docusate sodium (COLACE) 100 MG capsule Take 1 capsule (100 mg total) by mouth 2 (two) times daily. 03/22/14  Yes Montez Morita, PA-C  hydrALAZINE (APRESOLINE) 100 MG tablet TAKE 1 TABLET (100 MG TOTAL) BY MOUTH 3 (THREE) TIMES DAILY. 02/14/14  Yes Amy D Clegg, NP  isosorbide mononitrate (IMDUR) 60 MG 24 hr tablet TAKE 1.5 TABLETS BY MOUTH EVERY DAY 03/02/14  Yes Bevelyn Buckles Bensimhon, MD  metolazone (ZAROXOLYN) 2.5 MG tablet TAKE 1 TABLET BY MOUTH 30 MINUTES BEFORE YOUR MORNING LASIX ON TUES, THURS, AND SAT MORNINGS 06/05/14  Yes Dolores Patty, MD  oxyCODONE (OXY IR/ROXICODONE) 5 MG immediate release tablet Take 1-2 tablets (5-10 mg total) by mouth every 3 (three)  hours as needed for breakthrough pain. 03/22/14  Yes Montez Morita, PA-C  pravastatin (PRAVACHOL) 40 MG tablet TAKE 1 TABLET (40 MG TOTAL) BY MOUTH DAILY.   Yes Laurey Morale, MD  predniSONE (DELTASONE) 10 MG tablet Take 1 tablet (10 mg total) by mouth daily. Patient taking differently: Take 10 mg by mouth daily. Pt. Taking 1/2 tablet daily 08/05/12  Yes Daniel J Angiulli, PA-C  Probiotic Product (PROBIOTIC DAILY PO) Take 1 capsule by mouth daily.    Yes Historical Provider, MD  traMADol (ULTRAM) 50 MG tablet Take 1 tablet (  50 mg total) by mouth every 6 (six) hours as needed (pain). As needed for pain 06/01/14  Yes Lars Mage, PA-C  acetaminophen (TYLENOL) 325 MG tablet Take 2 tablets (650 mg total) by mouth every 6 (six) hours as needed for moderate pain or fever. 03/22/14   Montez Morita, PA-C   Current Facility-Administered Medications  Medication Dose Route Frequency Provider Last Rate Last Dose  . 0.9 %  sodium chloride infusion   Intravenous Continuous Marcene Duos, MD 10 mL/hr at 06/08/14 1035    . chlorhexidine (HIBICLENS) 4 % liquid 4 application  60 mL Topical Once Montez Morita, PA-C      . piperacillin-tazobactam (ZOSYN) IVPB 2.25 g  2.25 g Intravenous 3 times per day Myrene Galas, MD      . sodium chloride irrigation 0.9 %    PRN Myrene Galas, MD   3,000 mL at 06/08/14 1150  . tobramycin (NEBCIN) injection    PRN Myrene Galas, MD   320 mg at 06/08/14 1232  . vancomycin (VANCOCIN) powder    PRN Myrene Galas, MD   2 g at 06/08/14 1231   Facility-Administered Medications Ordered in Other Encounters  Medication Dose Route Frequency Provider Last Rate Last Dose  . ePHEDrine injection    Anesthesia Intra-op Hal J Lowder, CRNA   10 mg at 06/08/14 1313  . fentaNYL (SUBLIMAZE) injection    Anesthesia Intra-op Hal J Lowder, CRNA   50 mcg at 06/08/14 1239  . lidocaine (cardiac) 100 mg/4ml (XYLOCAINE) 20 MG/ML injection 2%    Anesthesia Intra-op Hal J Lowder, CRNA   70 mg at 06/08/14 1106  .  midazolam (VERSED) 5 MG/5ML injection    Anesthesia Intra-op Hal J Lowder, CRNA   2 mg at 06/08/14 1100  . ondansetron (ZOFRAN) injection   Intravenous Anesthesia Intra-op Hal J Lowder, CRNA   4 mg at 06/08/14 1349  . phenylephrine (NEO-SYNEPHRINE) 0.04 mg/mL in dextrose 5 % 250 mL infusion  10 mg  Continuous PRN Hal J Lowder, CRNA 75 mL/hr at 06/08/14 1130 50 mcg/min at 06/08/14 1130  . phenylephrine (NEO-SYNEPHRINE) injection    Anesthesia Intra-op Hal J Lowder, CRNA   80 mcg at 06/08/14 1316  . propofol (DIPRIVAN) 10 mg/mL bolus/IV push    Anesthesia Intra-op Hal J Lowder, CRNA   100 mg at 06/08/14 1106   Labs: Basic Metabolic Panel:  Recent Labs Lab 06/08/14 0948  NA 137  K 3.4*  CL 97*  CO2 27  GLUCOSE 77  BUN 21*  CREATININE 3.42*  CALCIUM 9.0   Liver Function Tests:  Recent Labs Lab 06/08/14 0948  AST 19  ALT 7*  ALKPHOS 65  BILITOT 0.5  PROT 8.0  ALBUMIN 2.4*   No results for input(s): LIPASE, AMYLASE in the last 168 hours. No results for input(s): AMMONIA in the last 168 hours. CBC:  Recent Labs Lab 06/08/14 0948  WBC 11.9*  NEUTROABS 7.2  HGB 13.9  HCT 42.5  MCV 92.0  PLT 140*   Cardiac Enzymes: No results for input(s): CKTOTAL, CKMB, CKMBINDEX, TROPONINI in the last 168 hours. CBG: No results for input(s): GLUCAP in the last 168 hours. Iron Studies: No results for input(s): IRON, TIBC, TRANSFERRIN, FERRITIN in the last 72 hours. Studies/Results: Dg Chest Portable 1 View  06/08/2014   CLINICAL DATA:  Preop leg surgery  EXAM: PORTABLE CHEST - 1 VIEW  COMPARISON:  03/20/2014  FINDINGS: Interval placement of dialysis catheter. Left jugular catheter tip in the lower SVC/RA  junction. Tip not well visualized.  Hypoventilation with decreased lung volume and bibasilar atelectasis. Negative for edema or effusion.  IMPRESSION: Hypoventilation with bibasilar atelectasis.   Electronically Signed   By: Marlan Palau M.D.   On: 06/08/2014 11:00    ROS:  Review  of Systems: Gen: Denies any fever, chills, sweats, anorexia, fatigue, weakness, malaise, weight loss, and sleep disorder HEENT: No visual complaints, No history of Retinopathy. Normal external appearance No Epistaxis or Sore throat. No sinusitis.   CV: Denies chest pain, angina, palpitations, syncope, orthopnea, PND, peripheral edema, and claudication. Resp: Denies dyspnea at rest, dyspnea with exercise, cough, sputum, wheezing, coughing up blood, and pleurisy. GI: Denies vomiting blood, jaundice, and fecal incontinence.   Denies dysphagia or odynophagia. GU : Denies urinary burning, blood in urine, urinary frequency, urinary hesitancy, nocturnal urination, and urinary incontinence.  No renal calculi. MS: Denies joint pain, limitation of movement, and swelling, stiffness, low back pain, extremity pain. Denies muscle weakness, cramps, atrophy.  No use of non steroidal antiinflammatory drugs. Derm: Denies rash, itching, dry skin, hives, moles, warts, or unhealing ulcers.  Psych: Denies depression, anxiety, memory loss, suicidal ideation, hallucinations, paranoia, and confusion. Heme: Denies bruising, bleeding, and enlarged lymph nodes. Neuro: No headache.  No diplopia. No dysarthria.  No dysphasia.  No history of CVA.  No Seizures. No paresthesias.  No weakness. Endocrine No DM.  No Thyroid disease.  No Adrenal disease.  Physical Exam: Filed Vitals:   06/08/14 0913  BP: 97/65  Pulse: 65  Temp: 97.7 F (36.5 C)  TempSrc: Oral  Resp: 20  Height: 6' (1.829 m)  Weight: 106.595 kg (235 lb)  SpO2: 98%     General: Well developed, well nourished, in no acute distress. Head: Normocephalic, atraumatic, sclera non-icteric, mucus membranes are moist Neck: Supple. JVD not elevated. Lungs: Clear bilaterally to auscultation without wheezes, rales, or rhonchi. Breathing is unlabored. Heart: RRR with S1 S2. No murmurs, rubs, or gallops appreciated. Abdomen: Soft, non-tender, non-distended with  normoactive bowel sounds. No rebound/guarding. No obvious abdominal masses. M-S:  Strength and tone appear normal for age. Lower extremities:without edema or ischemic changes, no open wounds  Neuro: Alert and oriented X 3. Moves all extremities spontaneously. Psych:  Responds to questions appropriately with a normal affect. Dialysis Access:  Dialysis Orders:  [facility] 4hr 30 mins   103.5kgs   2K/2.5ca   5000u heparin hectorol 1     Assessment/Plan: 1.  L distal fermur wound infection- per ortho- OR for I&D 5/12. Started on Vanc and zosyn for osteomyelitis coverage. 2.  ESRD -  MWF [place], HD pending tomorrow- no acute lab/clinical indications for HD today.  3.  Hypertension/volume  - Holding Anti-HTN medications at this time and managing hypotension. 4.  Anemia  - hgb 13.9- no ESA or Fe needed. Watch CBC for ABLA 5.  Metabolic bone disease -  When able to eat adequately, start phosphorus binders. Ca+ 9 Last phos 5.7 and PTH 474 continue hectorol  6.  Nutrition - alb 2.4. NPO at this time. Renal vit  Jetty Duhamel, NP [facility] [phone] 06/08/2014, 1:58 PM

## 2014-06-08 NOTE — Progress Notes (Addendum)
ANTIBIOTIC CONSULT NOTE - INITIAL  Pharmacy Consult for Zosyn Indication: infected left distal femur  No Known Allergies  Patient Measurements: Height: 6' (182.9 cm) Weight: 235 lb (106.595 kg) IBW/kg (Calculated) : 77.6   Vital Signs: Temp: 97.7 F (36.5 C) (05/12 0913) Temp Source: Oral (05/12 0913) BP: 97/65 mmHg (05/12 0913) Pulse Rate: 65 (05/12 0913) Intake/Output from previous day:   Intake/Output from this shift:    Labs:  Recent Labs  06/08/14 0948  WBC 11.9*  HGB 13.9  PLT 140*  CREATININE 3.42*   Estimated Creatinine Clearance: 30.1 mL/min (by C-G formula based on Cr of 3.42). No results for input(s): VANCOTROUGH, VANCOPEAK, VANCORANDOM, GENTTROUGH, GENTPEAK, GENTRANDOM, TOBRATROUGH, TOBRAPEAK, TOBRARND, AMIKACINPEAK, AMIKACINTROU, AMIKACIN in the last 72 hours.   Microbiology: No results found for this or any previous visit (from the past 720 hour(s)).  Medical History: Past Medical History  Diagnosis Date  . CKD (chronic kidney disease), stage IV     a. s/p renal transplant 1996.  Marland Kitchen HLD (hyperlipidemia)     takes Pravastatin daily  . Nonischemic cardiomyopathy     a. unknown etiology, EF 30-35% by echo 09/2011;  b. 09/2011 Lexi MV EF 42%, no ischemia/infarct.  . Systolic CHF     takes Lasix daily  . Atrial flutter     takes Amiodarone daily  . Gout     takes Allopurinol daily  . Hypertension     takes Amlodipine,Hydralazine,Imdur,and Coreg daily  . GERD (gastroesophageal reflux disease)     takes Protonix daily  . Shortness of breath dyspnea     exertion  . Pneumonia 2014  . History of bronchitis     several yrs ago  . Joint pain     right shoulder and left knee  . Sciatica   . Hepatitis C 1996    Hep C  . History of colon polyps   . History of kidney stones   . Urinary frequency   . History of shingles   . Hyperparathyroidism due to renal insufficiency   . Sleep apnea     does not use cpap  . Wound of left leg     Medications:   Prescriptions prior to admission  Medication Sig Dispense Refill Last Dose  . allopurinol (ZYLOPRIM) 100 MG tablet Take 1 tablet (100 mg total) by mouth 2 (two) times daily. 60 tablet 1 06/08/2014 at 0700  . amiodarone (PACERONE) 100 MG tablet TAKE 1 TABLET BY MOUTH EVERY DAY 30 tablet 6 06/08/2014 at 0700  . amiodarone (PACERONE) 100 MG tablet Take 1 tablet (100 mg total) by mouth daily. 30 tablet 0   . amLODipine (NORVASC) 2.5 MG tablet TAKE 1 TABLET (2.5 MG TOTAL) BY MOUTH DAILY. 30 tablet 6 06/08/2014 at 0700  . amLODipine (NORVASC) 2.5 MG tablet TAKE 1 TABLET (2.5 MG TOTAL) BY MOUTH DAILY. 30 tablet 6   . amoxicillin-clavulanate (AUGMENTIN) 500-125 MG per tablet TAKE 1 TABLET BY MOUTH TWICE A DAY 60 tablet 3 06/07/2014 at Unknown time  . aspirin 325 MG tablet Take 325 mg by mouth daily.   06/07/2014 at Unknown time  . calcitRIOL (ROCALTROL) 0.5 MCG capsule Take 0.5 mcg by mouth daily.  11 06/07/2014 at Unknown time  . carvedilol (COREG) 25 MG tablet TAKE 1 TABLET TWICE DAILY WITH A MEAL 60 tablet 3 06/08/2014 at 0700  . Cholecalciferol (VITAMIN D-3) 5000 UNITS TABS Take 5,000 Units by mouth daily.    06/07/2014 at Unknown time  . docusate sodium (  COLACE) 100 MG capsule Take 1 capsule (100 mg total) by mouth 2 (two) times daily. 10 capsule 0 06/07/2014 at Unknown time  . hydrALAZINE (APRESOLINE) 100 MG tablet TAKE 1 TABLET (100 MG TOTAL) BY MOUTH 3 (THREE) TIMES DAILY. 90 tablet 3 06/08/2014 at 0700  . isosorbide mononitrate (IMDUR) 60 MG 24 hr tablet TAKE 1.5 TABLETS BY MOUTH EVERY DAY 45 tablet 2 06/08/2014 at 0700  . metolazone (ZAROXOLYN) 2.5 MG tablet TAKE 1 TABLET BY MOUTH 30 MINUTES BEFORE YOUR MORNING LASIX ON TUES, THURS, AND SAT MORNINGS 15 tablet 3 not taking  . oxyCODONE (OXY IR/ROXICODONE) 5 MG immediate release tablet Take 1-2 tablets (5-10 mg total) by mouth every 3 (three) hours as needed for breakthrough pain. 70 tablet 0 06/07/2014 at Unknown time  . pravastatin (PRAVACHOL) 40 MG tablet  TAKE 1 TABLET (40 MG TOTAL) BY MOUTH DAILY. 30 tablet 6 06/07/2014 at Unknown time  . predniSONE (DELTASONE) 10 MG tablet Take 1 tablet (10 mg total) by mouth daily. (Patient taking differently: Take 10 mg by mouth daily. Pt. Taking 1/2 tablet daily) 30 tablet 1 06/07/2014 at Unknown time  . Probiotic Product (PROBIOTIC DAILY PO) Take 1 capsule by mouth daily.    06/07/2014 at Unknown time  . traMADol (ULTRAM) 50 MG tablet Take 1 tablet (50 mg total) by mouth every 6 (six) hours as needed (pain). As needed for pain 30 tablet 0 not taking  . acetaminophen (TYLENOL) 325 MG tablet Take 2 tablets (650 mg total) by mouth every 6 (six) hours as needed for moderate pain or fever.   More than a month at Unknown time   Assessment: 59 yo black male with chronic infection of left distal femur. Pt has numerous comorbidities including CKD s/p renal transplant, cardiomyopathy, CHF Hep c. Currently with purulent drainage from his left leg. For I&D of femur in OR 06/08/14. Dialysis on MWF  Goal of Therapy:  Resolution of osteomyelitis Monitor vital signs Plan:  Follow up culture results  Zosyn 3.375 g prior to surgery, then 2.25gm IV q8h Elder Cyphers, BS Pharm D, BCPS Clinical Pharmacist 06/08/2014,11:26 AM  Addum:  Will add vancomycin.  Received 1gm vanc preop at 11:05.  Will give additional 1 gm vanc for total load of 2 gm and start 1 gm IV after each HD tomorrow.   Talbert Cage, PharmD

## 2014-06-08 NOTE — Progress Notes (Signed)
Orthopedic Tech Progress Note Patient Details:  Chad [name] 25-May-1955 [id number] Patient unable to use ohf . Patient ID: Chad [name], male   DOB: 1955-09-17, 59 y.o.   MRN: [id number]   Chad [name] 06/08/2014, 7:27 PM

## 2014-06-08 NOTE — Consult Note (Signed)
[name]  Date of Admission:  06/08/2014  Date of Consult:  06/08/2014  Reason for Consult:  Referring Physician: Marcelino Scot  Impression/Recommendation Osteomyelitis L femur Wound infection Hepatitis C Renal txp 1996 (on prednisone $RemoveBefore'10mg'idAJlfkPvywPt$  daily), now on HD Non-isch CM (EF 30-25%)  Would Change his anbx to merrem and vanco Await his operative Cx Defer Hep C w/u til he is d/c back to clinic  Comment- G/S shows GPC. Will await his Cx before narrowing his anbx as he is heavily anbx experienced including previous GNR (although questionably skin flora). He is still on prednisone which may impeded his wound and bone healing.   Thank you so much for this interesting consult,   Chad [name] (pager) [phone] [email or web address]  Chad [name] is an 59 y.o. male.  HPI: 59 yo M with hx of L femur fracture 06-2012. He had ORIF with plate. His course was complicated by wound drainage, a superficial Cx grew Proteus and Enterococcus (late 2014). He was treated with vanco/zosyn. He underwent I & D and was found to have to have a seroma as well as an old hematoma. His Cx was (-) and he was d/c on vanco/merrem. He was eventually transitioned to augmentin.  He returned to hospital 02-2014 with non-union and had I & D and removal of hardware. His Cx were (-).  He had ID f/u 04-03-14, ESR 130, CRP 10.8,  and was continued on his augmentin.  He returns to hospital today with purulent d/c from his leg. He was taken to OR today. Post-op he has required neo and has been given albumin for hypotension. He was started on vanco/zosyn.   Past Medical History  Diagnosis Date  . CKD (chronic kidney disease), stage IV     a. s/p renal transplant 1996.  Marland Kitchen HLD (hyperlipidemia)     takes Pravastatin daily  . Nonischemic cardiomyopathy     a. unknown etiology, EF 30-35% by echo 09/2011;  b. 09/2011 Lexi MV EF 42%, no ischemia/infarct.  . Systolic CHF     takes Lasix daily  .  Atrial flutter     takes Amiodarone daily  . Gout     takes Allopurinol daily  . Hypertension     takes Amlodipine,Hydralazine,Imdur,and Coreg daily  . GERD (gastroesophageal reflux disease)     takes Protonix daily  . Shortness of breath dyspnea     exertion  . Pneumonia 2014  . History of bronchitis     several yrs ago  . Joint pain     right shoulder and left knee  . Sciatica   . Hepatitis C 1996    Hep C  . History of colon polyps   . History of kidney stones   . Urinary frequency   . History of shingles   . Hyperparathyroidism due to renal insufficiency   . Sleep apnea     does not use cpap  . Wound of left leg     Past Surgical History  Procedure Laterality Date  . Nephrectomy transplanted organ Right 72yrs ago  . Insertion of dialysis catheter  06/20/2011    Procedure: INSERTION OF DIALYSIS CATHETER;  Surgeon: Serafina Mitchell, MD;  Location: [place];  Service: Vascular;  Laterality: Right;  Ultrasound guided insertion of right internal jugular dialysis catheter  . Multiple extractions with alveoloplasty  06/27/2011    Procedure: MULTIPLE EXTRACION WITH ALVEOLOPLASTY;  Surgeon: Lenn Cal, DDS;  Location: [facility] [place];  Service: Oral Surgery;  Laterality: N/A;  Extraction  of tooth # 14 with alveoloplasty  . Hip surgery Left     replacement  . Joint replacement      left hip  . Femur fracture surgery Left 07/09/2012    Dr Sherrian Divers  . Femur im nail Left 07/10/2012    ORIF L distal femur;  Surgeon: Wylene Simmer, MD;  Location: [place] [facility];  Service: Orthopedics;  Laterality: Left;  . I&d extremity Left 08/24/2012    Procedure: LEFT LEG IRRIGATION AND DEBRIDEMENT WITH POSSIBLE WOUND VAC APPLICATION;  Surgeon: Wylene Simmer, MD;  Location: [place];  Service: Orthopedics;  Laterality: Left;  . Tracheostomy  2014  . Tracheostomy closure  2014    2 wks after it was done  . Colonsocopy    . Non union Left 03/14/2014    LEFT FEMER REPAIR     BY DR HANDY  . Hardware removal Left 03/14/2014     Procedure: HARDWARE REMOVAL LEFT;  Surgeon: Altamese [place], MD;  Location: [place];  Service: Orthopedics;  Laterality: Left;  . Wound debridement Left 03/14/2014    Procedure: DEBRIDEMENT WOUND;  Surgeon: Altamese Madera, MD;  Location: [place];  Service: Orthopedics;  Laterality: Left;  INSERTION OF  ANTIBIOTIC Beads  (2 strands: 15 beads and 14 beads)  . Av fistula placement Left 06/01/2014    Procedure: LEFT BRACHIOCEPHALIC ARTERIOVENOUS (AV) FISTULA CREATION;  Surgeon: Mal Misty, MD;  Location: [place];  Service: Vascular;  Laterality: Left;     No Known Allergies  Medications:  Scheduled: . albumin human      . calcium acetate  667 mg Oral TID WC  . chlorhexidine  60 mL Topical Once  . [START ON 06/09/2014] doxercalciferol  1 mcg Intravenous Q M,W,F-HD  . hydrocortisone sod succinate (SOLU-CORTEF) inj  50 mg Intravenous Q6H  . multivitamin  1 tablet Oral QHS  . oxyCODONE      . piperacillin-tazobactam (ZOSYN)  IV  2.25 g Intravenous 3 times per day  . vancomycin  1,000 mg Intravenous Q M,W,F-HD    Abtx:  Anti-infectives    Start     Dose/Rate Route Frequency Ordered Stop   06/08/14 1900  piperacillin-tazobactam (ZOSYN) IVPB 2.25 g     2.25 g 100 mL/hr over 30 Minutes Intravenous 3 times per day 06/08/14 1134     06/08/14 1232  tobramycin (NEBCIN) injection  Status:  Discontinued       As needed 06/08/14 1234 06/08/14 1501   06/08/14 1231  vancomycin (VANCOCIN) powder  Status:  Discontinued       As needed 06/08/14 1232 06/08/14 1501   06/08/14 1100  piperacillin-tazobactam (ZOSYN) IVPB 3.375 g  Status:  Discontinued     3.375 g 100 mL/hr over 30 Minutes Intravenous  Once 06/08/14 1046 06/08/14 1055   06/08/14 1100  piperacillin-tazobactam (ZOSYN) IVPB 3.375 g     3.375 g 100 mL/hr over 30 Minutes Intravenous On call to O.R. 06/08/14 1055 06/08/14 1335   06/08/14 0930  vancomycin (VANCOCIN) IVPB 1000 mg/200 mL premix     1,000 mg 200 mL/hr over 60 Minutes Intravenous On call to  O.R. 06/08/14 0929 06/08/14 1105      Total days of antibiotics 0 vanco/zosyn          Social History:  reports that he has never smoked. He has never used smokeless tobacco. He reports that he does not drink alcohol or use illicit drugs.  Family History  Problem Relation Age of  Onset  . Heart disease Neg Hx     General ROS: seen in PCAU, MICU, is without complaints. See HPI  Blood pressure 92/56, pulse 71, temperature 97.5 F (36.4 C), temperature source Oral, resp. rate 16, height 6' (1.829 m), weight 106.595 kg (235 lb), SpO2 100 %. General appearance: alert, cooperative and no distress Eyes: negative findings: pupils equal, round, reactive to light and accomodation Throat: normal findings: oropharynx pink & moist without lesions or evidence of thrush Neck: no adenopathy and supple, symmetrical, trachea midline Lungs: clear to auscultation bilaterally Chest wall: L chest HD line. Abdomen: normal findings: bowel sounds normal and soft, non-tender Extremities: LUE AVF with +bruit. LLE is wrapped. normal light touch in toes. foot is swollen there is some mild skin breakdown with bleeding. there are no diabetic foot ulcers.    Results for orders placed or performed during the hospital encounter of 06/08/14 (from the past 48 hour(s))  Comprehensive metabolic panel     Status: Abnormal   Collection Time: 06/08/14  9:48 AM  Result Value Ref Range   Sodium 137 135 - 145 mmol/L   Potassium 3.4 (L) 3.5 - 5.1 mmol/L   Chloride 97 (L) 101 - 111 mmol/L   CO2 27 22 - 32 mmol/L   Glucose, Bld 77 65 - 99 mg/dL   BUN 21 (H) 6 - 20 mg/dL   Creatinine, Ser 3.42 (H) 0.61 - 1.24 mg/dL   Calcium 9.0 8.9 - 10.3 mg/dL   Total Protein 8.0 6.5 - 8.1 g/dL   Albumin 2.4 (L) 3.5 - 5.0 g/dL   AST 19 15 - 41 U/L   ALT 7 (L) 17 - 63 U/L   Alkaline Phosphatase 65 38 - 126 U/L   Total Bilirubin 0.5 0.3 - 1.2 mg/dL   GFR calc non Af Amer 18 (L) >60 mL/min   GFR calc Af Amer 21 (L) >60 mL/min     Comment: (NOTE) The eGFR has been calculated using the CKD EPI equation. This calculation has not been validated in all clinical situations. eGFR's persistently <60 mL/min signify possible Chronic Kidney Disease.    Anion gap 13 5 - 15  CBC WITH DIFFERENTIAL     Status: Abnormal   Collection Time: 06/08/14  9:48 AM  Result Value Ref Range   WBC 11.9 (H) 4.0 - 10.5 K/uL   RBC 4.62 4.22 - 5.81 MIL/uL   Hemoglobin 13.9 13.0 - 17.0 g/dL   HCT 42.5 39.0 - 52.0 %   MCV 92.0 78.0 - 100.0 fL   MCH 30.1 26.0 - 34.0 pg   MCHC 32.7 30.0 - 36.0 g/dL   RDW 17.3 (H) 11.5 - 15.5 %   Platelets 140 (L) 150 - 400 K/uL   Neutrophils Relative % 61 43 - 77 %   Neutro Abs 7.2 1.7 - 7.7 K/uL   Lymphocytes Relative 22 12 - 46 %   Lymphs Abs 2.6 0.7 - 4.0 K/uL   Monocytes Relative 15 (H) 3 - 12 %   Monocytes Absolute 1.8 (H) 0.1 - 1.0 K/uL   Eosinophils Relative 1 0 - 5 %   Eosinophils Absolute 0.2 0.0 - 0.7 K/uL   Basophils Relative 1 0 - 1 %   Basophils Absolute 0.2 (H) 0.0 - 0.1 K/uL  Sedimentation rate     Status: Abnormal   Collection Time: 06/08/14  9:48 AM  Result Value Ref Range   Sed Rate 38 (H) 0 - 16 mm/hr  C-reactive protein  Status: Abnormal   Collection Time: 06/08/14  9:48 AM  Result Value Ref Range   CRP 5.0 (H) <1.0 mg/dL  Gram stain     Status: None   Collection Time: 06/08/14 11:55 AM  Result Value Ref Range   Specimen Description TISSUE THIGH LEFT    Special Requests NONE    Gram Stain      ABUNDANT WBC PRESENT,BOTH PMN AND MONONUCLEAR ABUNDANT GRAM POSITIVE COCCI IN PAIRS IN CLUSTERS Gram Stain Report Called to,Read Back By and Verified With: Ellison Carwin RN 13:20 06/08/14 ([email or web address])    Report Status 06/08/2014 FINAL   Gram stain     Status: None   Collection Time: 06/08/14  1:58 PM  Result Value Ref Range   Specimen Description TISSUE    Special Requests REAMINGS OF LEFT FEMUR    Gram Stain      ABUNDANT WBC PRESENT,BOTH PMN AND MONONUCLEAR NO ORGANISMS SEEN     Report Status 06/08/2014 FINAL       Component Value Date/Time   SDES TISSUE 06/08/2014 1358   SPECREQUEST REAMINGS OF LEFT FEMUR 06/08/2014 1358   CULT  03/18/2014 1534    NO GROWTH 5 DAYS Note: Culture results may be compromised due to an inadequate volume of blood received in culture bottles. Performed at [facility] 06/08/2014 FINAL 06/08/2014 1358   Dg Chest Portable 1 View  06/08/2014   CLINICAL DATA:  Preop leg surgery  EXAM: PORTABLE CHEST - 1 VIEW  COMPARISON:  03/20/2014  FINDINGS: Interval placement of dialysis catheter. Left jugular catheter tip in the lower SVC/RA junction. Tip not well visualized.  Hypoventilation with decreased lung volume and bibasilar atelectasis. Negative for edema or effusion.  IMPRESSION: Hypoventilation with bibasilar atelectasis.   Electronically Signed   By: Franchot Gallo M.D.   On: 06/08/2014 11:00   Dg Knee Left Port  06/08/2014   CLINICAL DATA:  Postoperative images of the left knee after incision and drainage with antibiotic beads  EXAM: PORTABLE LEFT KNEE - 1-2 VIEW  COMPARISON:  03/20/2014  FINDINGS: Numerous antibiotic beads project over fracture distal femoral metaphysis. The fracture remains mildly displaced with improved positioning and alignment in the anterior posterior orientation. There is soft tissue heterogeneity over the knee joint with catheter within the inferior aspect of the joint consistent with postoperative change. Air in the soft tissues laterally adjacent to and superior to the knee joint may reflect changes of infection as well.  There are lucencies over the distal femoral shaft measuring an area of about 7 cm in length. This appears to represent bone that has been evacuated of previous antibiotic beads.  IMPRESSION: Postoperative change with fracture distal femur.  Areas of lucency in distal femoral shaft where antibiotic beads previously were present. Beats are now concentrated more inferiorly in the region  of the metaphysis with a few in the lateral soft tissues.   Electronically Signed   By: Skipper Cliche M.D.   On: 06/08/2014 16:54   Dg C-arm 61-120 Min  06/08/2014   CLINICAL DATA:  Incision and drainage with antibiotic bead removal from left thigh. Chronic osteomyelitis.  EXAM: DG C-ARM 61-120 MIN; LEFT FEMUR 2 VIEWS  COMPARISON:  Radiographs 03/14/2014 and 03/20/2014.  FLUOROSCOPY TIME:  C-arm fluoroscopic images were obtained intraoperatively and submitted for post operative interpretation. Please see the performing provider's procedural report for the fluoroscopy time utilized.  FINDINGS: Four spot fluoroscopic images of the distal left femur demonstrate interval removal of  the antibiotic impregnated beads. The comminuted fracture of the distal femur appears grossly stable with impaction and posterior displacement. There is gas within the surgical bed.  IMPRESSION: Intraoperative views during antibiotic impregnated bead removal.   Electronically Signed   By: Richardean Sale M.D.   On: 06/08/2014 14:30   Dg Femur Min 2 Views Left  06/08/2014   CLINICAL DATA:  Incision and drainage with antibiotic bead removal from left thigh. Chronic osteomyelitis.  EXAM: DG C-ARM 61-120 MIN; LEFT FEMUR 2 VIEWS  COMPARISON:  Radiographs 03/14/2014 and 03/20/2014.  FLUOROSCOPY TIME:  C-arm fluoroscopic images were obtained intraoperatively and submitted for post operative interpretation. Please see the performing provider's procedural report for the fluoroscopy time utilized.  FINDINGS: Four spot fluoroscopic images of the distal left femur demonstrate interval removal of the antibiotic impregnated beads. The comminuted fracture of the distal femur appears grossly stable with impaction and posterior displacement. There is gas within the surgical bed.  IMPRESSION: Intraoperative views during antibiotic impregnated bead removal.   Electronically Signed   By: Richardean Sale M.D.   On: 06/08/2014 14:30   Recent Results  (from the past 240 hour(s))  Gram stain     Status: None   Collection Time: 06/08/14 11:55 AM  Result Value Ref Range Status   Specimen Description TISSUE THIGH LEFT  Final   Special Requests NONE  Final   Gram Stain   Final    ABUNDANT WBC PRESENT,BOTH PMN AND MONONUCLEAR ABUNDANT GRAM POSITIVE COCCI IN PAIRS IN CLUSTERS Gram Stain Report Called to,Read Back By and Verified With: Ellison Carwin RN 13:20 06/08/14 ([email or web address])    Report Status 06/08/2014 FINAL  Final  Gram stain     Status: None   Collection Time: 06/08/14  1:58 PM  Result Value Ref Range Status   Specimen Description TISSUE  Final   Special Requests REAMINGS OF LEFT FEMUR  Final   Gram Stain   Final    ABUNDANT WBC PRESENT,BOTH PMN AND MONONUCLEAR NO ORGANISMS SEEN    Report Status 06/08/2014 FINAL  Final      06/08/2014, 5:45 PM     LOS: 0 days

## 2014-06-09 ENCOUNTER — Inpatient Hospital Stay (HOSPITAL_COMMUNITY): Payer: Medicare HMO

## 2014-06-09 DIAGNOSIS — I959 Hypotension, unspecified: Secondary | ICD-10-CM

## 2014-06-09 DIAGNOSIS — I428 Other cardiomyopathies: Secondary | ICD-10-CM

## 2014-06-09 DIAGNOSIS — R579 Shock, unspecified: Secondary | ICD-10-CM

## 2014-06-09 DIAGNOSIS — B9562 Methicillin resistant Staphylococcus aureus infection as the cause of diseases classified elsewhere: Secondary | ICD-10-CM

## 2014-06-09 DIAGNOSIS — B1920 Unspecified viral hepatitis C without hepatic coma: Secondary | ICD-10-CM

## 2014-06-09 LAB — PHOSPHORUS: PHOSPHORUS: 5.3 mg/dL — AB (ref 2.5–4.6)

## 2014-06-09 LAB — HIV ANTIBODY (ROUTINE TESTING W REFLEX): HIV Screen 4th Generation wRfx: NONREACTIVE

## 2014-06-09 LAB — BASIC METABOLIC PANEL
Anion gap: 12 (ref 5–15)
BUN: 26 mg/dL — AB (ref 6–20)
CO2: 24 mmol/L (ref 22–32)
Calcium: 8.3 mg/dL — ABNORMAL LOW (ref 8.9–10.3)
Chloride: 94 mmol/L — ABNORMAL LOW (ref 101–111)
Creatinine, Ser: 4.07 mg/dL — ABNORMAL HIGH (ref 0.61–1.24)
GFR calc Af Amer: 17 mL/min — ABNORMAL LOW (ref >60)
GFR calc non Af Amer: 15 mL/min — ABNORMAL LOW (ref >60)
GLUCOSE: 140 mg/dL — AB (ref 65–99)
Potassium: 3.6 mmol/L (ref 3.5–5.1)
SODIUM: 130 mmol/L — AB (ref 135–145)

## 2014-06-09 LAB — CBC
HCT: 26.9 % — ABNORMAL LOW (ref 39.0–52.0)
Hemoglobin: 8.5 g/dL — ABNORMAL LOW (ref 13.0–17.0)
MCH: 29 pg (ref 26.0–34.0)
MCHC: 31.6 g/dL (ref 30.0–36.0)
MCV: 91.8 fL (ref 78.0–100.0)
Platelets: 161 10*3/uL (ref 150–400)
RBC: 2.93 MIL/uL — AB (ref 4.22–5.81)
RDW: 17.2 % — ABNORMAL HIGH (ref 11.5–15.5)
WBC: 18.3 10*3/uL — ABNORMAL HIGH (ref 4.0–10.5)

## 2014-06-09 LAB — HEMOGLOBIN A1C
Hgb A1c MFr Bld: 4.9 % (ref 4.8–5.6)
MEAN PLASMA GLUCOSE: 94 mg/dL

## 2014-06-09 LAB — MAGNESIUM: Magnesium: 1.7 mg/dL (ref 1.7–2.4)

## 2014-06-09 MED ORDER — DEXTROSE 5 % IV SOLN
0.0000 ug/min | INTRAVENOUS | Status: DC
  Administered 2014-06-09: 50 ug/min via INTRAVENOUS
  Filled 2014-06-09: qty 4

## 2014-06-09 MED ORDER — ISOSORBIDE MONONITRATE ER 60 MG PO TB24
60.0000 mg | ORAL_TABLET | Freq: Every day | ORAL | Status: DC
  Administered 2014-06-11: 60 mg via ORAL
  Filled 2014-06-09 (×2): qty 1

## 2014-06-09 MED ORDER — SODIUM CHLORIDE 0.9 % IV SOLN: 500.0000 mg | INTRAVENOUS | Status: DC

## 2014-06-09 MED ORDER — VANCOMYCIN HCL IN DEXTROSE 1-5 GM/200ML-% IV SOLN: 1000.0000 mg | INTRAVENOUS | Status: DC

## 2014-06-09 MED ORDER — MEROPENEM 500 MG IV SOLR: 500.0000 mg | INTRAVENOUS | Status: DC

## 2014-06-09 MED ORDER — CARVEDILOL 25 MG PO TABS
25.0000 mg | ORAL_TABLET | Freq: Two times a day (BID) | ORAL | Status: DC
  Filled 2014-06-09 (×2): qty 1

## 2014-06-09 MED ORDER — HYDROCORTISONE NA SUCCINATE PF 100 MG IJ SOLR
50.0000 mg | Freq: Two times a day (BID) | INTRAMUSCULAR | Status: DC
  Administered 2014-06-09 – 2014-06-10 (×2): 50 mg via INTRAVENOUS
  Filled 2014-06-09 (×2): qty 1

## 2014-06-09 MED ORDER — CARVEDILOL 12.5 MG PO TABS
12.5000 mg | ORAL_TABLET | Freq: Two times a day (BID) | ORAL | Status: DC
  Administered 2014-06-09 – 2014-06-12 (×4): 12.5 mg via ORAL
  Filled 2014-06-09 (×7): qty 1

## 2014-06-09 MED ORDER — MUPIROCIN 2 % EX OINT
1.0000 "application " | TOPICAL_OINTMENT | Freq: Two times a day (BID) | CUTANEOUS | Status: AC
  Administered 2014-06-09 – 2014-06-13 (×10): 1 via NASAL
  Filled 2014-06-09: qty 22

## 2014-06-09 MED ORDER — VANCOMYCIN HCL IN DEXTROSE 1-5 GM/200ML-% IV SOLN
1000.0000 mg | INTRAVENOUS | Status: AC
  Administered 2014-06-10: 1000 mg via INTRAVENOUS
  Filled 2014-06-09: qty 200

## 2014-06-09 MED ORDER — CHLORHEXIDINE GLUCONATE CLOTH 2 % EX PADS
6.0000 | MEDICATED_PAD | Freq: Every day | CUTANEOUS | Status: AC
  Administered 2014-06-09 – 2014-06-13 (×4): 6 via TOPICAL

## 2014-06-09 MED ORDER — SODIUM CHLORIDE 0.9 % IV SOLN
500.0000 mg | Freq: Every day | INTRAVENOUS | Status: DC
  Administered 2014-06-09 – 2014-06-10 (×2): 500 mg via INTRAVENOUS
  Filled 2014-06-09 (×4): qty 0.5

## 2014-06-09 NOTE — Progress Notes (Signed)
OT Cancellation Note  Patient Details Name: Chad [name] MRN: [id number] DOB: March 10, 1955   Cancelled Treatment:    Reason Eval/Treat Not Completed: Other (comment) (unavailable. Eating. Will see in am)  [facility] Quynn Vilchis, OTR/L  [phone] 06/09/2014 06/09/2014, 5:35 PM

## 2014-06-09 NOTE — Care Management Note (Signed)
Case Management Note  Patient Details  Name: Chad [name] MRN: [id number] Date of Birth: [birth year]  Subjective/Objective:  Pt admitted s/p I&D of left femur                 Action/Plan:  PTA pt lived at home- hx HD-ESRD- may need HH at discharge- NCM to follow for DME and [facility] needs  Expected Discharge Date:                  Expected Discharge Plan:  Home w Home Health Services  In-House Referral:     Discharge planning Services  CM Consult  Post Acute Care Choice:    Choice offered to:     DME Arranged:    DME Agency:     HH Arranged:    HH Agency:     Status of Service:  In process, will continue to follow  Medicare Important Message Given:    Date Medicare IM Given:    Medicare IM give by:    Date Additional Medicare IM Given:    Additional Medicare Important Message give by:     If discussed at Long Length of Stay Meetings, dates discussed:    Additional Comments:  Darrold Span, RN 06/09/2014, 12:38 PM

## 2014-06-09 NOTE — Progress Notes (Signed)
[facility] Progress Note  Subjective  Looks improved Pain tolerable No specific complaints   Review of Systems  Respiratory: Negative for shortness of breath and wheezing.   Cardiovascular: Negative for chest pain.  Gastrointestinal: Negative for nausea, vomiting and abdominal pain.  Neurological: Negative for headaches.    Objective   BP 108/64 mmHg  Pulse 101  Temp(Src) 97.8 F (36.6 C) (Oral)  Resp 16  Ht 6' (1.829 m)  Wt 106.595 kg (235 lb)  BMI 31.86 kg/m2  SpO2 100%  Intake/Output      05/12 0701 - 05/13 0700 05/13 0701 - 05/14 0700   I.V. (mL/kg) 1071 (10) 75.1 (0.7)   IV Piggyback 700    Total Intake(mL/kg) 1771 (16.6) 75.1 (0.7)   Urine (mL/kg/hr) 75    Drains 180    Blood 75    Total Output 330     Net +1441 +75.1          Labs   Results for DESTEN, MANOR (MRN [id number]) as of 06/09/2014 10:35  Ref. Range 06/09/2014 04:29  Sodium Latest Ref Range: 135-145 mmol/L 130 (L)  Potassium Latest Ref Range: 3.5-5.1 mmol/L 3.6  Chloride Latest Ref Range: 101-111 mmol/L 94 (L)  CO2 Latest Ref Range: 22-32 mmol/L 24  BUN Latest Ref Range: 6-20 mg/dL 26 (H)  Creatinine Latest Ref Range: 0.61-1.24 mg/dL 4.07 (H)  Calcium Latest Ref Range: 8.9-10.3 mg/dL 8.3 (L)  EGFR (Non-African Amer.) Latest Ref Range: >60 mL/min 15 (L)  EGFR (African American) Latest Ref Range: >60 mL/min 17 (L)  Glucose Latest Ref Range: 65-99 mg/dL 140 (H)  Anion gap Latest Ref Range: 5-15  12  Phosphorus Latest Ref Range: 2.5-4.6 mg/dL 5.3 (H)  Magnesium Latest Ref Range: 1.7-2.4 mg/dL 1.7  WBC Latest Ref Range: 4.0-10.5 K/uL 18.3 (H)  RBC Latest Ref Range: 4.22-5.81 MIL/uL 2.93 (L)  Hemoglobin Latest Ref Range: 13.0-17.0 g/dL 8.5 (L)  HCT Latest Ref Range: 39.0-52.0 % 26.9 (L)  MCV Latest Ref Range: 78.0-100.0 fL 91.8  MCH Latest Ref Range: 26.0-34.0 pg 29.0  MCHC Latest Ref Range: 30.0-36.0 g/dL 31.6  RDW Latest Ref Range: 11.5-15.5 % 17.2 (H)  Platelets Latest  Ref Range: 150-400 K/uL 161   Exam  Gen: resting comfortably in bed, stable appearing  Ext:       Left Lower Extremity   Dressing stable  prafo in place  Motor and sensory functions at baseline  Drains patent     Assessment and Plan   POD/HD#: 1   59 y/o male s/p I&D for persistent L distal femur osteo  1. L distal femur osteomyelitis and nonunion  New resorbable abx beads placed  No hardware implanted  Nonunion present  NWB L leg  2. Shock  Likely multifactorial  Appreciate CCM assistance  3. ESRD on dialysis  Dialysis tomorrow  Appreciate Renals assistance   4. ID  GPC on gram stain yesterday  Office culture showed GPC and GNR  abx- vanc and meropenem   Duration of abx per ID   Appreciate ID assistance   5. Medical issues  Once pt stable and transferred from ICU, will need hospitalist assistance as well   7. Acute on chronic anemia  H/H: 8.5/26.9  Defer to other services re: transfusion   8. Dispo  Dialysis tomorrow   Continue with current care      Jari Pigg, PA-C [facility] [phone] (O) 06/09/2014 10:24 AM

## 2014-06-09 NOTE — Progress Notes (Signed)
Utilization review completed.  

## 2014-06-09 NOTE — Progress Notes (Signed)
eLink Physician-Brief Progress Note Patient Name: Chad [name] DOB: [birth year] MRN: [id number]   Date of Service  06/09/2014  HPI/Events of Note  cxr reveiewd for cvl placement  eICU Interventions  Ok to use cvl - morning toeam to assess if line to ddeep - hard to say on port     Intervention Category Intermediate Interventions: Diagnostic test evaluation  Taiwana Willison 06/09/2014, 1:08 AM

## 2014-06-09 NOTE — Progress Notes (Signed)
INFECTIOUS DISEASE PROGRESS NOTE  ID: Chad [name] is a 59 y.o. male with  Active Problems:   Osteomyelitis   Shock  Subjective: Feels better, pain improved.   Abtx:  Anti-infectives    Start     Dose/Rate Route Frequency Ordered Stop   06/08/14 1900  piperacillin-tazobactam (ZOSYN) IVPB 2.25 g  Status:  Discontinued     2.25 g 100 mL/hr over 30 Minutes Intravenous 3 times per day 06/08/14 1134 06/08/14 1812   06/08/14 1830  meropenem (MERREM) 500 mg in sodium chloride 0.9 % 50 mL IVPB     500 mg 100 mL/hr over 30 Minutes Intravenous Every M-W-F (Hemodialysis) 06/08/14 1824     06/08/14 1800  vancomycin (VANCOCIN) IVPB 1000 mg/200 mL premix     1,000 mg 200 mL/hr over 60 Minutes Intravenous Every M-W-F (Hemodialysis) 06/08/14 1749     06/08/14 1232  tobramycin (NEBCIN) injection  Status:  Discontinued       As needed 06/08/14 1234 06/08/14 1501   06/08/14 1231  vancomycin (VANCOCIN) powder  Status:  Discontinued       As needed 06/08/14 1232 06/08/14 1501   06/08/14 1100  piperacillin-tazobactam (ZOSYN) IVPB 3.375 g  Status:  Discontinued     3.375 g 100 mL/hr over 30 Minutes Intravenous  Once 06/08/14 1046 06/08/14 1055   06/08/14 1100  piperacillin-tazobactam (ZOSYN) IVPB 3.375 g     3.375 g 100 mL/hr over 30 Minutes Intravenous On call to O.R. 06/08/14 1055 06/08/14 1335   06/08/14 0930  vancomycin (VANCOCIN) IVPB 1000 mg/200 mL premix     1,000 mg 200 mL/hr over 60 Minutes Intravenous On call to O.R. 06/08/14 0929 06/08/14 1105      Medications:  Scheduled: . allopurinol  100 mg Oral BID  . amiodarone  100 mg Oral Daily  . aspirin EC  325 mg Oral Daily  . calcium acetate  667 mg Oral TID WC  . carvedilol  12.5 mg Oral BID WC  . Chlorhexidine Gluconate Cloth  6 each Topical Q0600  . docusate sodium  100 mg Oral BID  . doxercalciferol  1 mcg Intravenous Q M,W,F-HD  . enoxaparin (LOVENOX) injection  30 mg Subcutaneous Q24H  . hydrocortisone sod succinate  (SOLU-CORTEF) inj  50 mg Intravenous Q12H  . [START ON 06/10/2014] isosorbide mononitrate  60 mg Oral Daily  . meropenem (MERREM) IV  500 mg Intravenous Q M,W,F-HD  . multivitamin  1 tablet Oral QHS  . mupirocin ointment  1 application Nasal BID  . pravastatin  40 mg Oral q1800  . vancomycin  1,000 mg Intravenous Q M,W,F-HD    Objective: Vital signs in last 24 hours: Temp:  [97.5 F (36.4 C)-98 F (36.7 C)] 98 F (36.7 C) (05/13 1113) Pulse Rate:  [50-101] 73 (05/13 1100) Resp:  [11-28] 19 (05/13 1100) BP: (72-155)/(47-99) 94/55 mmHg (05/13 1100) SpO2:  [90 %-100 %] 96 % (05/13 1100)   General appearance: alert, cooperative and no distress Resp: clear to auscultation bilaterally Cardio: regular rate and rhythm GI: normal findings: bowel sounds normal and soft, non-tender Extremities: LLE wrapped, toes dressed.   Lab Results  Recent Labs  06/08/14 1908 06/08/14 2140 06/09/14 0429  WBC  --  16.6* 18.3*  HGB  --  9.8* 8.5*  HCT  --  31.6* 26.9*  NA 134*  --  130*  K 3.6  --  3.6  CL 96*  --  94*  CO2 24  --  24  BUN 22*  --  26*  CREATININE 3.78*  --  4.07*   Liver Panel  Recent Labs  06/08/14 0948 06/08/14 1908  PROT 8.0  --   ALBUMIN 2.4* 2.9*  AST 19  --   ALT 7*  --   ALKPHOS 65  --   BILITOT 0.5  --    Sedimentation Rate  Recent Labs  06/08/14 0948  ESRSEDRATE 38*   C-Reactive Protein  Recent Labs  06/08/14 0948  CRP 5.0*    Microbiology: Recent Results (from the past 240 hour(s))  Anaerobic culture     Status: None (Preliminary result)   Collection Time: 06/08/14 11:55 AM  Result Value Ref Range Status   Specimen Description TISSUE THIGH LEFT  Final   Special Requests NONE  Final   Gram Stain   Final    ABUNDANT WBC PRESENT,BOTH PMN AND MONONUCLEAR ABUNDANT GRAM POSITIVE COCCI IN CLUSTERS IN PAIRS Performed at [facility] Gram Stain Report Called to,Read Back By and Verified With: Gram Stain Report Called to,Read Back By and  Verified With: Mendel Corning RN 1320 06/08/14 [place] WILSONM Performed at [facility] PENDING  Incomplete   Report Status PENDING  Incomplete  Gram stain     Status: None   Collection Time: 06/08/14 11:55 AM  Result Value Ref Range Status   Specimen Description TISSUE THIGH LEFT  Final   Special Requests NONE  Final   Gram Stain   Final    ABUNDANT WBC PRESENT,BOTH PMN AND MONONUCLEAR ABUNDANT GRAM POSITIVE COCCI IN PAIRS IN CLUSTERS Gram Stain Report Called to,Read Back By and Verified With: Mendel Corning RN 13:20 06/08/14 ([email or web address])    Report Status 06/08/2014 FINAL  Final  Tissue culture     Status: None (Preliminary result)   Collection Time: 06/08/14 11:55 AM  Result Value Ref Range Status   Specimen Description TISSUE THIGH LEFT  Final   Special Requests NONE  Final   Gram Stain   Final    ABUNDANT WBC PRESENT,BOTH PMN AND MONONUCLEAR ABUNDANT GRAM POSITIVE COCCI IN CLUSTERS IN PAIRS Performed at [facility] Gram Stain Report Called to,Read Back By and Verified With: Gram Stain Report Called to,Read Back By and Verified With: Mendel Corning RN 1320 06/08/14 [place] WILSONM Performed at [facility]   Final    Culture reincubated for better growth Performed at [facility]    Report Status PENDING  Incomplete  Gram stain     Status: None   Collection Time: 06/08/14  1:58 PM  Result Value Ref Range Status   Specimen Description TISSUE  Final   Special Requests REAMINGS OF LEFT FEMUR  Final   Gram Stain   Final    ABUNDANT WBC PRESENT,BOTH PMN AND MONONUCLEAR NO ORGANISMS SEEN    Report Status 06/08/2014 FINAL  Final  Tissue culture     Status: None (Preliminary result)   Collection Time: 06/08/14  1:58 PM  Result Value Ref Range Status   Specimen Description TISSUE  Final   Special Requests REAMINGS OF LEFT FEMUR  Final   Gram Stain   Final    ABUNDANT WBC PRESENT,BOTH PMN AND MONONUCLEAR NO ORGANISMS SEEN Performed at [facility] Performed at [facility]    Culture   Final    NO GROWTH 1 DAY Performed at [facility]    Report Status PENDING  Incomplete  MRSA PCR Screening  Status: Abnormal   Collection Time: 06/08/14  6:15 PM  Result Value Ref Range Status   MRSA by PCR POSITIVE (A) NEGATIVE Final    Comment:        The GeneXpert MRSA Assay (FDA approved for NASAL specimens only), is one component of a comprehensive MRSA colonization surveillance program. It is not intended to diagnose MRSA infection nor to guide or monitor treatment for MRSA infections. RESULT CALLED TO, READ BACK BY AND VERIFIED WITH: RN,GREG HARDUK [id number] @2121  THANEY     Studies/Results: Dg Chest Port 1 View  06/09/2014   CLINICAL DATA:  Central line placement.  EXAM: PORTABLE CHEST - 1 VIEW  COMPARISON:  06/08/2014  FINDINGS: Shallow inspiration with atelectasis in the lung bases. Cardiac enlargement. Pulmonary vascularity is normal. Left central venous catheter remains in place without change. Interval placement of a right central venous catheter with tip over the cavoatrial junction region. No pneumothorax. Calcified and tortuous aorta. Degenerative changes in the right shoulder.  IMPRESSION: Appliances appear to be in satisfactory location. Shallow inspiration with atelectasis in the lung bases.   Electronically Signed   By: Burman Nieves M.D.   On: 06/09/2014 00:52   Dg Chest Portable 1 View  06/08/2014   CLINICAL DATA:  Preop leg surgery  EXAM: PORTABLE CHEST - 1 VIEW  COMPARISON:  03/20/2014  FINDINGS: Interval placement of dialysis catheter. Left jugular catheter tip in the lower SVC/RA junction. Tip not well visualized.  Hypoventilation with decreased lung volume and bibasilar atelectasis. Negative for edema or effusion.  IMPRESSION: Hypoventilation with bibasilar atelectasis.   Electronically Signed   By: Marlan Palau M.D.   On: 06/08/2014 11:00   Dg Knee Left Port  06/08/2014   CLINICAL  DATA:  Postoperative images of the left knee after incision and drainage with antibiotic beads  EXAM: PORTABLE LEFT KNEE - 1-2 VIEW  COMPARISON:  03/20/2014  FINDINGS: Numerous antibiotic beads project over fracture distal femoral metaphysis. The fracture remains mildly displaced with improved positioning and alignment in the anterior posterior orientation. There is soft tissue heterogeneity over the knee joint with catheter within the inferior aspect of the joint consistent with postoperative change. Air in the soft tissues laterally adjacent to and superior to the knee joint may reflect changes of infection as well.  There are lucencies over the distal femoral shaft measuring an area of about 7 cm in length. This appears to represent bone that has been evacuated of previous antibiotic beads.  IMPRESSION: Postoperative change with fracture distal femur.  Areas of lucency in distal femoral shaft where antibiotic beads previously were present. Beats are now concentrated more inferiorly in the region of the metaphysis with a few in the lateral soft tissues.   Electronically Signed   By: Esperanza Heir M.D.   On: 06/08/2014 16:54   Dg C-arm 61-120 Min  06/08/2014   CLINICAL DATA:  Incision and drainage with antibiotic bead removal from left thigh. Chronic osteomyelitis.  EXAM: DG C-ARM 61-120 MIN; LEFT FEMUR 2 VIEWS  COMPARISON:  Radiographs 03/14/2014 and 03/20/2014.  FLUOROSCOPY TIME:  C-arm fluoroscopic images were obtained intraoperatively and submitted for post operative interpretation. Please see the performing provider's procedural report for the fluoroscopy time utilized.  FINDINGS: Four spot fluoroscopic images of the distal left femur demonstrate interval removal of the antibiotic impregnated beads. The comminuted fracture of the distal femur appears grossly stable with impaction and posterior displacement. There is gas within the surgical bed.  IMPRESSION: Intraoperative views during  antibiotic  impregnated bead removal.   Electronically Signed   By: Carey Bullocks M.D.   On: 06/08/2014 14:30   Dg Femur Min 2 Views Left  06/08/2014   CLINICAL DATA:  Incision and drainage with antibiotic bead removal from left thigh. Chronic osteomyelitis.  EXAM: DG C-ARM 61-120 MIN; LEFT FEMUR 2 VIEWS  COMPARISON:  Radiographs 03/14/2014 and 03/20/2014.  FLUOROSCOPY TIME:  C-arm fluoroscopic images were obtained intraoperatively and submitted for post operative interpretation. Please see the performing provider's procedural report for the fluoroscopy time utilized.  FINDINGS: Four spot fluoroscopic images of the distal left femur demonstrate interval removal of the antibiotic impregnated beads. The comminuted fracture of the distal femur appears grossly stable with impaction and posterior displacement. There is gas within the surgical bed.  IMPRESSION: Intraoperative views during antibiotic impregnated bead removal.   Electronically Signed   By: Carey Bullocks M.D.   On: 06/08/2014 14:30     Assessment/Plan: Shock/sepsis Osteomyelitis L femur Wound infection ? 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  Office Cx gnr and gpc Op cx abundant GNR BCx pending No change in anbx for now       Johny Sax Infectious Diseases (pager) [phone] [email or web address] 06/09/2014, 11:40 AM  LOS: 1 day

## 2014-06-09 NOTE — Progress Notes (Signed)
Called to verify the placement of central line after insertion, and chest x-ray was done. Dr. Marchelle Gearing aware with order to use the central line.

## 2014-06-09 NOTE — Progress Notes (Signed)
S: Eating well.  Pain controlled O:BP 98/57 mmHg  Pulse 83  Temp(Src) 97.8 F (36.6 C) (Oral)  Resp 14  Ht 6' (1.829 m)  Wt 106.595 kg (235 lb)  BMI 31.86 kg/m2  SpO2 95%  Intake/Output Summary (Last 24 hours) at 06/09/14 0846 Last data filed at 06/09/14 0800  Gross per 24 hour  Intake 1793.5 ml  Output    330 ml  Net 1463.5 ml   Weight change:  XGZ:FPOIP and alert CVS: RRR Resp: clear Abd:+ BS NTND Ext: no edema.  Lt leg wrapped with 2 drains.  LUA AVF + bruit NEURO:CNI OX3 no asterixis Rt IJ PC  . allopurinol  100 mg Oral BID  . amiodarone  100 mg Oral Daily  . amLODipine  2.5 mg Oral Daily  . aspirin EC  325 mg Oral Daily  . calcium acetate  667 mg Oral TID WC  . carvedilol  25 mg Oral BID WC  . Chlorhexidine Gluconate Cloth  6 each Topical Q0600  . docusate sodium  100 mg Oral BID  . doxercalciferol  1 mcg Intravenous Q M,W,F-HD  . enoxaparin (LOVENOX) injection  30 mg Subcutaneous Q24H  . hydrALAZINE  100 mg Oral 3 times per day  . hydrocortisone sod succinate (SOLU-CORTEF) inj  50 mg Intravenous Q6H  . isosorbide mononitrate  90 mg Oral Daily  . meropenem (MERREM) IV  500 mg Intravenous Q M,W,F-HD  . multivitamin  1 tablet Oral QHS  . mupirocin ointment  1 application Nasal BID  . pravastatin  40 mg Oral q1800  . vancomycin  1,000 mg Intravenous Q M,W,F-HD   Dg Chest Port 1 View  06/09/2014   CLINICAL DATA:  Central line placement.  EXAM: PORTABLE CHEST - 1 VIEW  COMPARISON:  06/08/2014  FINDINGS: Shallow inspiration with atelectasis in the lung bases. Cardiac enlargement. Pulmonary vascularity is normal. Left central venous catheter remains in place without change. Interval placement of a right central venous catheter with tip over the cavoatrial junction region. No pneumothorax. Calcified and tortuous aorta. Degenerative changes in the right shoulder.  IMPRESSION: Appliances appear to be in satisfactory location. Shallow inspiration with atelectasis in the lung  bases.   Electronically Signed   By: Burman Nieves M.D.   On: 06/09/2014 00:52   Dg Chest Portable 1 View  06/08/2014   CLINICAL DATA:  Preop leg surgery  EXAM: PORTABLE CHEST - 1 VIEW  COMPARISON:  03/20/2014  FINDINGS: Interval placement of dialysis catheter. Left jugular catheter tip in the lower SVC/RA junction. Tip not well visualized.  Hypoventilation with decreased lung volume and bibasilar atelectasis. Negative for edema or effusion.  IMPRESSION: Hypoventilation with bibasilar atelectasis.   Electronically Signed   By: Marlan Palau M.D.   On: 06/08/2014 11:00   Dg Knee Left Port  06/08/2014   CLINICAL DATA:  Postoperative images of the left knee after incision and drainage with antibiotic beads  EXAM: PORTABLE LEFT KNEE - 1-2 VIEW  COMPARISON:  03/20/2014  FINDINGS: Numerous antibiotic beads project over fracture distal femoral metaphysis. The fracture remains mildly displaced with improved positioning and alignment in the anterior posterior orientation. There is soft tissue heterogeneity over the knee joint with catheter within the inferior aspect of the joint consistent with postoperative change. Air in the soft tissues laterally adjacent to and superior to the knee joint may reflect changes of infection as well.  There are lucencies over the distal femoral shaft measuring an area of about 7 cm  in length. This appears to represent bone that has been evacuated of previous antibiotic beads.  IMPRESSION: Postoperative change with fracture distal femur.  Areas of lucency in distal femoral shaft where antibiotic beads previously were present. Beats are now concentrated more inferiorly in the region of the metaphysis with a few in the lateral soft tissues.   Electronically Signed   By: Esperanza Heir M.D.   On: 06/08/2014 16:54   Dg C-arm 61-120 Min  06/08/2014   CLINICAL DATA:  Incision and drainage with antibiotic bead removal from left thigh. Chronic osteomyelitis.  EXAM: DG C-ARM 61-120 MIN;  LEFT FEMUR 2 VIEWS  COMPARISON:  Radiographs 03/14/2014 and 03/20/2014.  FLUOROSCOPY TIME:  C-arm fluoroscopic images were obtained intraoperatively and submitted for post operative interpretation. Please see the performing provider's procedural report for the fluoroscopy time utilized.  FINDINGS: Four spot fluoroscopic images of the distal left femur demonstrate interval removal of the antibiotic impregnated beads. The comminuted fracture of the distal femur appears grossly stable with impaction and posterior displacement. There is gas within the surgical bed.  IMPRESSION: Intraoperative views during antibiotic impregnated bead removal.   Electronically Signed   By: Carey Bullocks M.D.   On: 06/08/2014 14:30   Dg Femur Min 2 Views Left  06/08/2014   CLINICAL DATA:  Incision and drainage with antibiotic bead removal from left thigh. Chronic osteomyelitis.  EXAM: DG C-ARM 61-120 MIN; LEFT FEMUR 2 VIEWS  COMPARISON:  Radiographs 03/14/2014 and 03/20/2014.  FLUOROSCOPY TIME:  C-arm fluoroscopic images were obtained intraoperatively and submitted for post operative interpretation. Please see the performing provider's procedural report for the fluoroscopy time utilized.  FINDINGS: Four spot fluoroscopic images of the distal left femur demonstrate interval removal of the antibiotic impregnated beads. The comminuted fracture of the distal femur appears grossly stable with impaction and posterior displacement. There is gas within the surgical bed.  IMPRESSION: Intraoperative views during antibiotic impregnated bead removal.   Electronically Signed   By: Carey Bullocks M.D.   On: 06/08/2014 14:30   BMET    Component Value Date/Time   NA 130* 06/09/2014 0429   K 3.6 06/09/2014 0429   CL 94* 06/09/2014 0429   CO2 24 06/09/2014 0429   GLUCOSE 140* 06/09/2014 0429   BUN 26* 06/09/2014 0429   CREATININE 4.07* 06/09/2014 0429   CREATININE 5.50* 04/03/2014 0936   CALCIUM 8.3* 06/09/2014 0429   CALCIUM 9.4  03/14/2014 0932   GFRNONAA 15* 06/09/2014 0429   GFRAA 17* 06/09/2014 0429   CBC    Component Value Date/Time   WBC 18.3* 06/09/2014 0429   RBC 2.93* 06/09/2014 0429   RBC 2.11* 03/16/2014 0723   HGB 8.5* 06/09/2014 0429   HCT 26.9* 06/09/2014 0429   PLT 161 06/09/2014 0429   MCV 91.8 06/09/2014 0429   MCH 29.0 06/09/2014 0429   MCHC 31.6 06/09/2014 0429   RDW 17.2* 06/09/2014 0429   LYMPHSABS 2.6 06/08/2014 0948   MONOABS 1.8* 06/08/2014 0948   EOSABS 0.2 06/08/2014 0948   BASOSABS 0.2* 06/08/2014 0948     Assessment: 1. Lt femur osteo with drainage  SP I&D.  On vanco/meropenum 2. 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 Hypotension post I&D 4. ESRD  Plan: 1. Will plan HD tomorrow since metabolically, he is stable and still on pressors 2. Renal Diet 3. Re check labs in AM   Fernado Brigante T

## 2014-06-09 NOTE — Anesthesia Postprocedure Evaluation (Signed)
  Anesthesia Post-op Note  Patient: Chad [name]  Procedure(s) Performed: Procedure(s): INCISION AND DRAINAGE OF LEFT THIGH (Left)  Patient Location: PACU  Anesthesia Type:General  Level of Consciousness: awake and alert   Airway and Oxygen Therapy: Patient Spontanous Breathing  Post-op Pain: none  Post-op Assessment: Post-op Vital signs reviewed, Patient's Cardiovascular Status Stable and Respiratory Function Stable  Post-op Vital Signs: Reviewed  Filed Vitals:   06/09/14 1600  BP: 89/51  Pulse: 74  Temp: 36.9 C  Resp: 18    Complications: No apparent anesthesia complications

## 2014-06-09 NOTE — Procedures (Signed)
Central Venous Catheter Insertion Procedure Note Chad [name] [id number] 1955-02-22  Procedure: Insertion of Central Venous Catheter Indications: Assessment of intravascular volume, Drug and/or fluid administration and Frequent blood sampling  Procedure Details Consent: Risks of procedure as well as the alternatives and risks of each were explained to the (patient/caregiver).  Consent for procedure obtained. Time Out: Verified patient identification, verified procedure, site/side was marked, verified correct patient position, special equipment/implants available, medications/allergies/relevent history reviewed, required imaging and test results available.  Performed  Maximum sterile technique was used including antiseptics, cap, gloves, gown, hand hygiene, mask and sheet. Skin prep: Chlorhexidine; local anesthetic administered A antimicrobial bonded/coated triple lumen catheter was placed in the right internal jugular vein using the Seldinger technique. Ultrasound guidance used.Yes.   Catheter placed to 16 cm. Blood aspirated via all 3 ports and then flushed x 3. Line sutured x 2 and dressing applied.  Evaluation Blood flow good Complications: No apparent complications Patient did tolerate procedure well. Chest X-ray ordered to verify placement.  CXR: pending.  Chad [name], AGACNP-BC [name]  06/09/2014 12:24 AM     Chad Fischer, MD ; [facility] service Mobile [phone].  After 5:30 PM or weekends, call [phone]

## 2014-06-09 NOTE — Progress Notes (Signed)
Rehab Admissions Coordinator Note:  Patient was screened by Trish Mage for appropriateness for an Inpatient Acute Rehab Consult.  At this time, we are recommending Skilled Nursing Facility or [facility] therapies depending on progress.  Given current diagnosis, it is very unlikely that [facility] medicare would authorize an acute inpatient rehab admission.  Call me for questions.    Trish Mage 06/09/2014, 1:26 PM  I can be reached at [phone].

## 2014-06-09 NOTE — Evaluation (Signed)
Physical Therapy Evaluation Patient Details Name: CATARINO VOLD MRN: [id number] DOB: 29-Oct-1955 Today's Date: 06/09/2014   History of Present Illness  Pt is a 59 y/o male with a PMH of CKD s/p renal transplant, cardiomyopathy, CHF, Hep C. 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 Pt with previous IM nailing, and hardware was removed and replaced with abx beads in 02/2014 to try and address this. Pt is now s/p I&D of his L thigh/distal femur.   Clinical Impression  Pt admitted with above diagnosis. Pt currently with functional limitations due to the deficits listed below (see PT Problem List). At the time of PT eval pt was able to perform transfers with +2 assist. Pt reports that he has been working with HHPT, however he is motivated to get to a point where he can stand pivot transfer and begin to walk again. Pt will benefit from skilled PT to increase their independence and safety with mobility to allow discharge to the venue listed below. Feel pt is a good candidate for a CIR stay, to improve functional mobility prior to return home.      Follow Up Recommendations CIR;Supervision/Assistance - 24 hour    Equipment Recommendations  None recommended by PT    Recommendations for Other Services Rehab consult     Precautions / Restrictions Precautions Precautions: Fall Precaution Comments: Unna boot on RLE - pt has wounds there as well Restrictions Weight Bearing Restrictions: Yes LLE Weight Bearing: Non weight bearing      Mobility  Bed Mobility Overal bed mobility: Needs Assistance;+2 for physical assistance Bed Mobility: Supine to Sit     Supine to sit: Min assist;+2 for physical assistance     General bed mobility comments: Assist for LE movement towards EOB. Pt required trunk support as he transitioned to EOB, and assist to complete scooting with bed pad.   Transfers Overall transfer level: Needs assistance Equipment used: Rolling walker (2  wheeled) Transfers: Sit to/from Stand Sit to Stand: Mod assist;+2 physical assistance         General transfer comment: Pt was able to tolerate x2 stands at EOB, holding ~10-20 seconds each time. Pt was able to keep weight off the LLE however was not able to hold it up off the ground.   Ambulation/Gait             General Gait Details: Pt has not been ambulating for some time PTA. He is at a transfers only level at this point.   Stairs            Wheelchair Mobility    Modified Rankin (Stroke Patients Only)       Balance Overall balance assessment: History of Falls;Needs assistance Sitting-balance support: Feet supported;No upper extremity supported Sitting balance-Leahy Scale: Fair     Standing balance support: Bilateral upper extremity supported;During functional activity Standing balance-Leahy Scale: Poor                               Pertinent Vitals/Pain Pain Assessment: Faces Faces Pain Scale: Hurts little more Pain Location: LE's with movement Pain Descriptors / Indicators: Discomfort Pain Intervention(s): Limited activity within patient's tolerance;Monitored during session;Repositioned    Home Living Family/patient expects to be discharged to:: Inpatient rehab                 Additional Comments: Pt currently living with son who is gone for work during the  days.    Prior Function Level of Independence: Needs assistance   Gait / Transfers Assistance Needed: Able to transfer with slide board to w/c and states he has not been able to SPT in a long time.   ADL's / Homemaking Assistance Needed: Pt states son assists him in getting washed up.         Hand Dominance   Dominant Hand: Right    Extremity/Trunk Assessment   Upper Extremity Assessment: Defer to OT evaluation           Lower Extremity Assessment: Generalized weakness      Cervical / Trunk Assessment: Normal  Communication   Communication: No difficulties   Cognition Arousal/Alertness: Awake/alert Behavior During Therapy: WFL for tasks assessed/performed Overall Cognitive Status: Within Functional Limits for tasks assessed                      General Comments      Exercises        Assessment/Plan    PT Assessment Patient needs continued PT services  PT Diagnosis Difficulty walking;Acute pain;Generalized weakness   PT Problem List Decreased strength;Decreased range of motion;Decreased activity tolerance;Decreased balance;Decreased mobility;Decreased knowledge of use of DME;Decreased safety awareness;Decreased knowledge of precautions;Pain  PT Treatment Interventions DME instruction;Functional mobility training;Therapeutic activities;Therapeutic exercise;Neuromuscular re-education;Patient/family education;Gait training;Wheelchair mobility training   PT Goals (Current goals can be found in the Care Plan section) Acute Rehab PT Goals Patient Stated Goal: Get back to being able to stand pivot transfer PT Goal Formulation: With patient Time For Goal Achievement: 06/23/14 Potential to Achieve Goals: Good    Frequency Min 3X/week   Barriers to discharge        Co-evaluation               End of Session Equipment Utilized During Treatment: Gait belt Activity Tolerance: Patient limited by fatigue Patient left: in bed;with call bell/phone within reach Nurse Communication: Mobility status         Time: 8657-8469 PT Time Calculation (min) (ACUTE ONLY): 37 min   Charges:   PT Evaluation $Initial PT Evaluation Tier I: 1 Procedure PT Treatments $Therapeutic Activity: 8-22 mins   PT G Codes:        Conni Slipper 06/15/14, 12:21 PM   Conni Slipper, PT, DPT Acute Rehabilitation Services Pager: [phone]

## 2014-06-09 NOTE — Progress Notes (Signed)
Nutrition Brief Note  Patient identified on the Malnutrition Screening Tool (MST) Report. Weight seems to have fluctuated over the past year, likely related to fluid status with renal failure. No significant decline in dry weight noted.  Wt Readings from Last 15 Encounters:  06/08/14 235 lb (106.595 kg)  06/01/14 235 lb (106.595 kg)  05/30/14 235 lb (106.595 kg)  04/06/14 242 lb (109.77 kg)  03/22/14 280 lb (127.007 kg)  03/06/14 272 lb (123.378 kg)  09/26/13 253 lb 12 oz (115.1 kg)  07/25/13 246 lb (111.585 kg)  06/13/13 234 lb 8 oz (106.369 kg)  02/16/13 269 lb 1.9 oz (122.072 kg)  02/07/13 221 lb 6.4 oz (100.426 kg)  01/17/13 242 lb (109.77 kg)  08/30/12 278 lb 3.5 oz (126.2 kg)  08/05/12 285 lb 15 oz (129.7 kg)  07/13/12 270 lb (122.471 kg)    Body mass index is 31.86 kg/(m^2). Patient meets criteria for obesity, class 1, based on current BMI.   Current diet order is renal with 1200 ml fluid restriction. Labs and medications reviewed.   No nutrition interventions warranted at this time. If nutrition issues arise, please consult RD.   Joaquin Courts, RD, LDN, CNSC Pager [phone] After Hours Pager [phone]

## 2014-06-09 NOTE — Progress Notes (Signed)
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 Admitted 5/12 for I&D of L femoral non-union and osteomyelitis. Post procedure was extubated successfully, experienced hypotension unresponsive to colloid x3. Will be admitted to ICU. PCCM consulted to assist with care.   STUDIES:  L knee 5/12 >> post-op changes, abx beads removed and new ones placed inferiorly  SIGNIFICANT EVENTS:  L knee I&D 5/12   SUBJECTIVE:  RASS 0. No distress. No new complaints   VITAL SIGNS: Temp:  [97.5 F (36.4 C)-98 F (36.7 C)] 98 F (36.7 C) (05/13 1113) Pulse Rate:  [50-101] 78 (05/13 1515) Resp:  [11-26] 19 (05/13 1515) BP: (74-155)/(42-92) 86/54 mmHg (05/13 1515) SpO2:  [90 %-100 %] 96 % (05/13 1515) HEMODYNAMICS:   VENTILATOR SETTINGS:   INTAKE / OUTPUT:  Intake/Output Summary (Last 24 hours) at 06/09/14 1533 Last data filed at 06/09/14 1500  Gross per 24 hour  Intake 1268.3 ml  Output    255 ml  Net 1013.3 ml    PHYSICAL EXAMINATION: General:  NAD Neuro:  nonfocal HEENT: WNL Cardiovascular: reg, no M Lungs: clear Abdomen:  Soft, NT, ND and +BS. Ext: RLE with compression dressing. LLE in cast  LABS:  CBC  Recent Labs Lab 06/08/14 0948 06/08/14 1700 06/08/14 2140 06/09/14 0429  WBC 11.9*  --  16.6* 18.3*  HGB 13.9 10.2* 9.8* 8.5*  HCT 42.5 33.0* 31.6* 26.9*  PLT 140*  --  156 161   Coag's No results for input(s): APTT, INR in the last 168 hours. BMET  Recent Labs Lab 06/08/14 0948 06/08/14 1908 06/09/14 0429  NA 137 134* 130*  K 3.4* 3.6 3.6  CL 97* 96* 94*  CO2 27 24 24   BUN 21* 22* 26*  CREATININE 3.42* 3.78* 4.07*  GLUCOSE 77 107* 140*   Electrolytes  Recent Labs Lab 06/08/14 0948 06/08/14 1908  06/09/14 0429  CALCIUM 9.0 8.5* 8.3*  MG  --   --  1.7  PHOS  --  5.3* 5.3*   Sepsis Markers No results for input(s): LATICACIDVEN, PROCALCITON, O2SATVEN in the last 168 hours. ABG No results for input(s): PHART, PCO2ART, PO2ART in the last 168 hours. Liver Enzymes  Recent Labs Lab 06/08/14 0948 06/08/14 1908  AST 19  --   ALT 7*  --   ALKPHOS 65  --   BILITOT 0.5  --   ALBUMIN 2.4* 2.9*   Cardiac Enzymes No results for input(s): TROPONINI, PROBNP in the last 168 hours. Glucose  Recent Labs Lab 06/08/14 1806  GLUCAP 78    CXR: NACPD   ASSESSMENT / PLAN:  PULMONARY OETT 5/12 >> 5/12 A:  Atelectasis P:   Supp O2 Incentive spirometry  CARDIOVASCULAR R IJ CVL 5/13 >>  A:  Hypotension - likely septic, post anesthetic  Chronic steroids. 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 HD planned for 5/14  GASTROINTESTINAL A:   No issues P:   SUP: N/I Renal diet - continue  HEMATOLOGIC A: Anemia P:  DVT px: SQ LMWH Monitor CBC intermittently Transfuse per usual ICU guidelines  INFECTIOUS A:   L distal femoral osteomyelitis P:   BCx2 5/12 >> Tissue 5/12>>>  Vanc 5/12>>> meropenem 5/12>>>  ID service following. All abx per them  ENDOCRINE A:   Relative adrenal insufficiency P:   Taper HC as tolerated ot baseline pred dose of 10 mg daily  NEUROLOGIC A:  Post op pain P:   Cont PRN MSO4 and oxycodone   Likely transfer out of ICU 5/14 if remains off vasopressors  Billy Fischer, MD ; [facility] service Mobile [phone].  After 5:30 PM or weekends, call [phone]  06/09/2014, 3:33 PM

## 2014-06-10 DIAGNOSIS — X58XXXA Exposure to other specified factors, initial encounter: Secondary | ICD-10-CM

## 2014-06-10 DIAGNOSIS — R579 Shock, unspecified: Secondary | ICD-10-CM

## 2014-06-10 DIAGNOSIS — B964 Proteus (mirabilis) (morganii) as the cause of diseases classified elsewhere: Secondary | ICD-10-CM

## 2014-06-10 DIAGNOSIS — S71102A Unspecified open wound, left thigh, initial encounter: Secondary | ICD-10-CM

## 2014-06-10 LAB — CBC
HCT: 23.7 % — ABNORMAL LOW (ref 39.0–52.0)
Hemoglobin: 7.6 g/dL — ABNORMAL LOW (ref 13.0–17.0)
MCH: 28.9 pg (ref 26.0–34.0)
MCHC: 32.1 g/dL (ref 30.0–36.0)
MCV: 90.1 fL (ref 78.0–100.0)
Platelets: 130 10*3/uL — ABNORMAL LOW (ref 150–400)
RBC: 2.63 MIL/uL — ABNORMAL LOW (ref 4.22–5.81)
RDW: 17.3 % — ABNORMAL HIGH (ref 11.5–15.5)
WBC: 16.8 10*3/uL — ABNORMAL HIGH (ref 4.0–10.5)

## 2014-06-10 LAB — RENAL FUNCTION PANEL
ALBUMIN: 2.2 g/dL — AB (ref 3.5–5.0)
Anion gap: 10 (ref 5–15)
BUN: 38 mg/dL — AB (ref 6–20)
CALCIUM: 8.1 mg/dL — AB (ref 8.9–10.3)
CHLORIDE: 96 mmol/L — AB (ref 101–111)
CO2: 25 mmol/L (ref 22–32)
Creatinine, Ser: 4.89 mg/dL — ABNORMAL HIGH (ref 0.61–1.24)
GFR calc Af Amer: 14 mL/min — ABNORMAL LOW (ref >60)
GFR calc non Af Amer: 12 mL/min — ABNORMAL LOW (ref >60)
Glucose, Bld: 126 mg/dL — ABNORMAL HIGH (ref 65–99)
Phosphorus: 6.1 mg/dL — ABNORMAL HIGH (ref 2.5–4.6)
Potassium: 4.2 mmol/L (ref 3.5–5.1)
Sodium: 131 mmol/L — ABNORMAL LOW (ref 135–145)

## 2014-06-10 LAB — HEPATITIS B SURFACE ANTIGEN: Hepatitis B Surface Ag: NEGATIVE

## 2014-06-10 LAB — URINE CULTURE
Colony Count: NO GROWTH
Culture: NO GROWTH

## 2014-06-10 LAB — GLUCOSE, CAPILLARY: GLUCOSE-CAPILLARY: 115 mg/dL — AB (ref 65–99)

## 2014-06-10 MED ORDER — POLYETHYLENE GLYCOL 3350 17 G PO PACK
17.0000 g | PACK | Freq: Every day | ORAL | Status: DC | PRN
  Administered 2014-06-10: 17 g via ORAL
  Filled 2014-06-10 (×3): qty 1

## 2014-06-10 MED ORDER — PREDNISONE 5 MG PO TABS
5.0000 mg | ORAL_TABLET | Freq: Every day | ORAL | Status: DC
  Administered 2014-06-11 – 2014-06-19 (×9): 5 mg via ORAL
  Filled 2014-06-10 (×9): qty 1

## 2014-06-10 MED ORDER — DOXERCALCIFEROL 4 MCG/2ML IV SOLN
INTRAVENOUS | Status: AC
  Filled 2014-06-10: qty 2

## 2014-06-10 NOTE — Progress Notes (Signed)
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 Admitted 5/12 for I&D of L femoral non-union and osteomyelitis. Post procedure was extubated successfully, experienced hypotension unresponsive to colloid x3. Will be admitted to ICU. PCCM consulted to assist with care.   STUDIES:  L knee 5/12 >> post-op changes, abx beads removed and new ones placed inferiorly  SIGNIFICANT EVENTS:  L knee I&D 5/12   VITAL SIGNS: Temp:  [97.5 F (36.4 C)-98.5 F (36.9 C)] 98.4 F (36.9 C) (05/14 1257) Pulse Rate:  [66-82] 68 (05/14 1230) Resp:  [14-26] 14 (05/14 1230) BP: (78-111)/(40-62) 99/59 mmHg (05/14 1230) SpO2:  [90 %-100 %] 96 % (05/14 1230)  Room air  HEMODYNAMICS:   VENTILATOR SETTINGS:   INTAKE / OUTPUT:  Intake/Output Summary (Last 24 hours) at 06/10/14 1354 Last data filed at 06/10/14 1200  Gross per 24 hour  Intake    639 ml  Output   1700 ml  Net  -1061 ml    PHYSICAL EXAMINATION: General:  NAD Neuro:  nonfocal HEENT: WNL Cardiovascular: reg, no M Lungs: clear Abdomen:  Soft, NT, ND and +BS. Ext: RLE with compression dressing. LLE wrapped in unaboot.   LABS:  CBC  Recent Labs Lab 06/08/14 2140 06/09/14 0429 06/10/14 0428  WBC 16.6* 18.3* 16.8*  HGB 9.8* 8.5* 7.6*  HCT 31.6* 26.9* 23.7*  PLT 156 161 130*   Coag's No results for input(s): APTT, INR in the last 168 hours. BMET  Recent Labs Lab 06/08/14 1908 06/09/14 0429 06/10/14 0428  NA 134* 130* 131*  K 3.6 3.6 4.2  CL 96* 94* 96*  CO2 24 24 25   BUN 22* 26* 38*  CREATININE 3.78* 4.07* 4.89*  GLUCOSE 107* 140* 126*   Electrolytes  Recent Labs Lab 06/08/14 1908 06/09/14 0429 06/10/14 0428  CALCIUM 8.5* 8.3* 8.1*  MG  --  1.7  --   PHOS  5.3* 5.3* 6.1*   Sepsis Markers No results for input(s): LATICACIDVEN, PROCALCITON, O2SATVEN in the last 168 hours. ABG No results for input(s): PHART, PCO2ART, PO2ART in the last 168 hours. Liver Enzymes  Recent Labs Lab 06/08/14 0948 06/08/14 1908 06/10/14 0428  AST 19  --   --   ALT 7*  --   --   ALKPHOS 65  --   --   BILITOT 0.5  --   --   ALBUMIN 2.4* 2.9* 2.2*   Cardiac Enzymes No results for input(s): TROPONINI, PROBNP in the last 168 hours. Glucose  Recent Labs Lab 06/08/14 1806  GLUCAP 78    CXR: NACPD   ASSESSMENT / PLAN:  PULMONARY OETT 5/12 >> 5/12 A:  Atelectasis P:   Supp O2 Incentive spirometry  CARDIOVASCULAR R IJ CVL 5/13 >>  A:  Hypotension - likely septic, post anesthetic  Chronic steroids. 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 HD tolerated well  GASTROINTESTINAL A:  No issues P:   SUP: N/I Renal diet - continue  HEMATOLOGIC A: Anemia P:  DVT px: SQ LMWH Monitor CBC intermittently Transfuse per usual ICU guidelines  INFECTIOUS A:   L distal femoral osteomyelitis P:   BCx2 5/12 >> Tissue 5/12>>>abundant GPC clusters>>>  Vanc 5/12>>> meropenem 5/12>>>  ID service following. All abx per them  ENDOCRINE A:   Relative adrenal insufficiency P:   Taper HC as tolerated ot baseline pred dose of 10 mg daily  NEUROLOGIC A:  Post op pain P:   Cont PRN MSO4 and oxycodone   Ready to transfer to medical renal floor. We will s/o. Has nephrology following. If needs further IM assist would call [facility].   Simonne Martinet ACNP-BC [facility] Pulmonary/Critical Care Pager # [phone] OR # [phone] if  no answer   Attending Note:  I have examined patient, reviewed labs, studies and notes. I have discussed the case with Kreg Shropshire, and I agree with the data and plans as amended above. He is hemodynamically improving. MS on my eval is normal. He tolerated HD today. We will continue his abx, plan to move him to floor bed today. May want to re-involve [facility] to assist on the floor.   Levy Pupa, MD, PhD 06/10/2014, 2:07 PM [place] [facility] [phone] or if no answer [phone]

## 2014-06-10 NOTE — Progress Notes (Signed)
INFECTIOUS DISEASE PROGRESS NOTE  ID: Chad [name] is a 59 y.o. male with  Active Problems:   Osteomyelitis   Shock   Arterial hypotension   ESRD (end stage renal disease)  Subjective: Without complaints  Abtx:  Anti-infectives    Start     Dose/Rate Route Frequency Ordered Stop   06/12/14 1200  vancomycin (VANCOCIN) IVPB 1000 mg/200 mL premix     1,000 mg 200 mL/hr over 60 Minutes Intravenous Every M-W-F (Hemodialysis) 06/09/14 1334     06/12/14 1200  meropenem (MERREM) 500 mg in sodium chloride 0.9 % 50 mL IVPB  Status:  Discontinued     500 mg 100 mL/hr over 30 Minutes Intravenous Every M-W-F (Hemodialysis) 06/09/14 1334 06/09/14 1501   06/10/14 1200  meropenem (MERREM) 500 mg in sodium chloride 0.9 % 50 mL IVPB  Status:  Discontinued     500 mg 100 mL/hr over 30 Minutes Intravenous Every Sat (Hemodialysis) 06/09/14 1334 06/09/14 1501   06/10/14 1200  vancomycin (VANCOCIN) IVPB 1000 mg/200 mL premix     1,000 mg 200 mL/hr over 60 Minutes Intravenous Every Sat (Hemodialysis) 06/09/14 1334 06/10/14 1142   06/09/14 1800  meropenem (MERREM) 500 mg in sodium chloride 0.9 % 50 mL IVPB     500 mg 100 mL/hr over 30 Minutes Intravenous Daily after supper 06/09/14 1501     06/08/14 1900  piperacillin-tazobactam (ZOSYN) IVPB 2.25 g  Status:  Discontinued     2.25 g 100 mL/hr over 30 Minutes Intravenous 3 times per day 06/08/14 1134 06/08/14 1812   06/08/14 1830  meropenem (MERREM) 500 mg in sodium chloride 0.9 % 50 mL IVPB  Status:  Discontinued     500 mg 100 mL/hr over 30 Minutes Intravenous Every M-W-F (Hemodialysis) 06/08/14 1824 06/09/14 1316   06/08/14 1800  vancomycin (VANCOCIN) IVPB 1000 mg/200 mL premix  Status:  Discontinued     1,000 mg 200 mL/hr over 60 Minutes Intravenous Every M-W-F (Hemodialysis) 06/08/14 1749 06/09/14 1316   06/08/14 1232  tobramycin (NEBCIN) injection  Status:  Discontinued       As needed 06/08/14 1234 06/08/14 1501   06/08/14 1231   vancomycin (VANCOCIN) powder  Status:  Discontinued       As needed 06/08/14 1232 06/08/14 1501   06/08/14 1100  piperacillin-tazobactam (ZOSYN) IVPB 3.375 g  Status:  Discontinued     3.375 g 100 mL/hr over 30 Minutes Intravenous  Once 06/08/14 1046 06/08/14 1055   06/08/14 1100  piperacillin-tazobactam (ZOSYN) IVPB 3.375 g     3.375 g 100 mL/hr over 30 Minutes Intravenous On call to O.R. 06/08/14 1055 06/08/14 1335   06/08/14 0930  vancomycin (VANCOCIN) IVPB 1000 mg/200 mL premix     1,000 mg 200 mL/hr over 60 Minutes Intravenous On call to O.R. 06/08/14 0929 06/08/14 1105      Medications:  Scheduled: . allopurinol  100 mg Oral BID  . amiodarone  100 mg Oral Daily  . aspirin EC  325 mg Oral Daily  . calcium acetate  667 mg Oral TID WC  . carvedilol  12.5 mg Oral BID WC  . Chlorhexidine Gluconate Cloth  6 each Topical Q0600  . docusate sodium  100 mg Oral BID  . doxercalciferol      . doxercalciferol  1 mcg Intravenous Q M,W,F-HD  . enoxaparin (LOVENOX) injection  30 mg Subcutaneous Q24H  . hydrocortisone sod succinate (SOLU-CORTEF) inj  50 mg Intravenous Q12H  . isosorbide mononitrate  60 mg Oral Daily  . meropenem (MERREM) IV  500 mg Intravenous QPC supper  . multivitamin  1 tablet Oral QHS  . mupirocin ointment  1 application Nasal BID  . pravastatin  40 mg Oral q1800  . [START ON 06/12/2014] vancomycin  1,000 mg Intravenous Q M,W,F-HD    Objective: Vital signs in last 24 hours: Temp:  [97.5 F (36.4 C)-98.5 F (36.9 C)] 98.2 F (36.8 C) (05/14 1125) Pulse Rate:  [66-82] 71 (05/14 1125) Resp:  [14-26] 16 (05/14 1125) BP: (76-111)/(40-62) 110/52 mmHg (05/14 1125) SpO2:  [90 %-100 %] 96 % (05/14 1125)   General appearance: alert, cooperative and no distress Resp: clear to auscultation bilaterally Chest wall: L chest HD line non-tender Cardio: regular rate and rhythm GI: normal findings: bowel sounds normal and soft, non-tender Extremities: LLE wrapped. Normal  light touch in toes.   Lab Results  Recent Labs  06/09/14 0429 06/10/14 0428  WBC 18.3* 16.8*  HGB 8.5* 7.6*  HCT 26.9* 23.7*  NA 130* 131*  K 3.6 4.2  CL 94* 96*  CO2 24 25  BUN 26* 38*  CREATININE 4.07* 4.89*   Liver Panel  Recent Labs  06/08/14 0948 06/08/14 1908 06/10/14 0428  PROT 8.0  --   --   ALBUMIN 2.4* 2.9* 2.2*  AST 19  --   --   ALT 7*  --   --   ALKPHOS 65  --   --   BILITOT 0.5  --   --    Sedimentation Rate  Recent Labs  06/08/14 0948  ESRSEDRATE 38*   C-Reactive Protein  Recent Labs  06/08/14 0948  CRP 5.0*    Microbiology: Recent Results (from the past 240 hour(s))  Anaerobic culture     Status: None (Preliminary result)   Collection Time: 06/08/14 11:55 AM  Result Value Ref Range Status   Specimen Description TISSUE THIGH LEFT  Final   Special Requests NONE  Final   Gram Stain   Final    ABUNDANT WBC PRESENT,BOTH PMN AND MONONUCLEAR ABUNDANT GRAM POSITIVE COCCI IN CLUSTERS IN PAIRS Performed at [facility] Gram Stain Report Called to,Read Back By and Verified With: Gram Stain Report Called to,Read Back By and Verified With: Mendel Corning RN 1320 06/08/14 [place] WILSONM Performed at [facility]   Final    NO ANAEROBES ISOLATED; CULTURE IN PROGRESS FOR 5 DAYS Performed at [facility]    Report Status PENDING  Incomplete  Gram stain     Status: None   Collection Time: 06/08/14 11:55 AM  Result Value Ref Range Status   Specimen Description TISSUE THIGH LEFT  Final   Special Requests NONE  Final   Gram Stain   Final    ABUNDANT WBC PRESENT,BOTH PMN AND MONONUCLEAR ABUNDANT GRAM POSITIVE COCCI IN PAIRS IN CLUSTERS Gram Stain Report Called to,Read Back By and Verified With: Mendel Corning RN 13:20 06/08/14 ([email or web address])    Report Status 06/08/2014 FINAL  Final  Tissue culture     Status: None (Preliminary result)   Collection Time: 06/08/14 11:55 AM  Result Value Ref Range Status   Specimen Description  TISSUE THIGH LEFT  Final   Special Requests NONE  Final   Gram Stain   Final    ABUNDANT WBC PRESENT,BOTH PMN AND MONONUCLEAR ABUNDANT GRAM POSITIVE COCCI IN CLUSTERS IN PAIRS Performed at [facility] Gram Stain Report Called to,Read Back By and Verified With: Gram Stain  Report Called to,Read Back By and Verified With: Mendel Corning RN [phone] 06/08/14 BY Jasper Riling Performed at [facility]    Culture   Final    MODERATE PROTEUS MIRABILIS Performed at [facility]    Report Status PENDING  Incomplete  Anaerobic culture     Status: None (Preliminary result)   Collection Time: 06/08/14  1:58 PM  Result Value Ref Range Status   Specimen Description TISSUE  Final   Special Requests REAMINGS OF LEFT FEMUR PT ON ANCEF AND ZOSYN  Final   Gram Stain   Final    ABUNDANT WBC PRESENT,BOTH PMN AND MONONUCLEAR NO ORGANISMS SEEN Performed at [facility] Performed at [facility]    Culture   Final    NO ANAEROBES ISOLATED; CULTURE IN PROGRESS FOR 5 DAYS Performed at [facility]    Report Status PENDING  Incomplete  Gram stain     Status: None   Collection Time: 06/08/14  1:58 PM  Result Value Ref Range Status   Specimen Description TISSUE  Final   Special Requests REAMINGS OF LEFT FEMUR  Final   Gram Stain   Final    ABUNDANT WBC PRESENT,BOTH PMN AND MONONUCLEAR NO ORGANISMS SEEN    Report Status 06/08/2014 FINAL  Final  Tissue culture     Status: None (Preliminary result)   Collection Time: 06/08/14  1:58 PM  Result Value Ref Range Status   Specimen Description TISSUE  Final   Special Requests REAMINGS OF LEFT FEMUR  Final   Gram Stain   Final    ABUNDANT WBC PRESENT,BOTH PMN AND MONONUCLEAR NO ORGANISMS SEEN Performed at [facility] Performed at [facility]    Culture   Final    NO GROWTH 2 DAYS Performed at [facility]    Report Status PENDING  Incomplete  MRSA PCR Screening     Status: Abnormal   Collection  Time: 06/08/14  6:15 PM  Result Value Ref Range Status   MRSA by PCR POSITIVE (A) NEGATIVE Final    Comment:        The GeneXpert MRSA Assay (FDA approved for NASAL specimens only), is one component of a comprehensive MRSA colonization surveillance program. It is not intended to diagnose MRSA infection nor to guide or monitor treatment for MRSA infections. RESULT CALLED TO, READ BACK BY AND VERIFIED WITH: RN,GREG HARDUK [phone] @[phone]  THANEY   Culture, blood (routine x 2)     Status: None (Preliminary result)   Collection Time: 06/08/14  7:08 PM  Result Value Ref Range Status   Specimen Description BLOOD RIGHT ANTECUBITAL  Final   Special Requests   Final    BOTTLES DRAWN AEROBIC AND ANAEROBIC 5CCBLUE 3CC RED   Culture   Final           BLOOD CULTURE RECEIVED NO GROWTH TO DATE CULTURE WILL BE HELD FOR 5 DAYS BEFORE ISSUING A FINAL NEGATIVE REPORT Note: Culture results may be compromised due to an inadequate volume of blood received in culture bottles. Performed at [facility]    Report Status PENDING  Incomplete  Culture, blood (routine x 2)     Status: None (Preliminary result)   Collection Time: 06/08/14  7:20 PM  Result Value Ref Range Status   Specimen Description BLOOD RIGHT HAND  Final   Special Requests BOTTLES DRAWN AEROBIC ONLY  Final   Culture   Final  BLOOD CULTURE RECEIVED NO GROWTH TO DATE CULTURE WILL BE HELD FOR 5 DAYS BEFORE ISSUING A FINAL NEGATIVE REPORT Performed at [facility]    Report Status PENDING  Incomplete  Urine culture     Status: None   Collection Time: 06/09/14  7:03 AM  Result Value Ref Range Status   Specimen Description URINE, RANDOM  Final   Special Requests NONE  Final   Colony Count NO GROWTH Performed at [facility]   Final   Culture NO GROWTH Performed at [facility]   Final   Report Status 06/10/2014 FINAL  Final    Studies/Results: Dg Chest Port 1 View  06/09/2014   CLINICAL  DATA:  Central line placement.  EXAM: PORTABLE CHEST - 1 VIEW  COMPARISON:  06/08/2014  FINDINGS: Shallow inspiration with atelectasis in the lung bases. Cardiac enlargement. Pulmonary vascularity is normal. Left central venous catheter remains in place without change. Interval placement of a right central venous catheter with tip over the cavoatrial junction region. No pneumothorax. Calcified and tortuous aorta. Degenerative changes in the right shoulder.  IMPRESSION: Appliances appear to be in satisfactory location. Shallow inspiration with atelectasis in the lung bases.   Electronically Signed   By: Burman Nieves M.D.   On: 06/09/2014 00:52   Dg Knee Left Port  06/08/2014   CLINICAL DATA:  Postoperative images of the left knee after incision and drainage with antibiotic beads  EXAM: PORTABLE LEFT KNEE - 1-2 VIEW  COMPARISON:  03/20/2014  FINDINGS: Numerous antibiotic beads project over fracture distal femoral metaphysis. The fracture remains mildly displaced with improved positioning and alignment in the anterior posterior orientation. There is soft tissue heterogeneity over the knee joint with catheter within the inferior aspect of the joint consistent with postoperative change. Air in the soft tissues laterally adjacent to and superior to the knee joint may reflect changes of infection as well.  There are lucencies over the distal femoral shaft measuring an area of about 7 cm in length. This appears to represent bone that has been evacuated of previous antibiotic beads.  IMPRESSION: Postoperative change with fracture distal femur.  Areas of lucency in distal femoral shaft where antibiotic beads previously were present. Beats are now concentrated more inferiorly in the region of the metaphysis with a few in the lateral soft tissues.   Electronically Signed   By: Esperanza Heir M.D.   On: 06/08/2014 16:54   Dg C-arm 61-120 Min  06/08/2014   CLINICAL DATA:  Incision and drainage with antibiotic bead  removal from left thigh. Chronic osteomyelitis.  EXAM: DG C-ARM 61-120 MIN; LEFT FEMUR 2 VIEWS  COMPARISON:  Radiographs 03/14/2014 and 03/20/2014.  FLUOROSCOPY TIME:  C-arm fluoroscopic images were obtained intraoperatively and submitted for post operative interpretation. Please see the performing provider's procedural report for the fluoroscopy time utilized.  FINDINGS: Four spot fluoroscopic images of the distal left femur demonstrate interval removal of the antibiotic impregnated beads. The comminuted fracture of the distal femur appears grossly stable with impaction and posterior displacement. There is gas within the surgical bed.  IMPRESSION: Intraoperative views during antibiotic impregnated bead removal.   Electronically Signed   By: Carey Bullocks M.D.   On: 06/08/2014 14:30   Dg Femur Min 2 Views Left  06/08/2014   CLINICAL DATA:  Incision and drainage with antibiotic bead removal from left thigh. Chronic osteomyelitis.  EXAM: DG C-ARM 61-120 MIN; LEFT FEMUR 2 VIEWS  COMPARISON:  Radiographs 03/14/2014 and 03/20/2014.  FLUOROSCOPY TIME:  C-arm fluoroscopic images were obtained intraoperatively and submitted for post operative interpretation. Please see the performing provider's procedural report for the fluoroscopy time utilized.  FINDINGS: Four spot fluoroscopic images of the distal left femur demonstrate interval removal of the antibiotic impregnated beads. The comminuted fracture of the distal femur appears grossly stable with impaction and posterior displacement. There is gas within the surgical bed.  IMPRESSION: Intraoperative views during antibiotic impregnated bead removal.   Electronically Signed   By: Carey Bullocks M.D.   On: 06/08/2014 14:30     Assessment/Plan: Shock/sepsis Osteomyelitis L femur Wound infection ? 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 (although his g/s did appear to have staph)  He appears to be improving, out of unit?         Johny Sax Infectious Diseases (pager) [phone] [email or web address] 06/10/2014, 12:27 PM  LOS: 2 days

## 2014-06-10 NOTE — Progress Notes (Signed)
Orthopedic Tech Progress Note Patient Details:  Chad [name] [birth year] [id number]  Ortho Devices Type of Ortho Device: Roland Rack boot Ortho Device/Splint Location: lle Ortho Device/Splint Interventions: Application   [name] 06/10/2014, 2:01 PM

## 2014-06-10 NOTE — Procedures (Signed)
Pt seen on HD.  Ap 240 Vp 90 BFR 265.  Note tissue cilture + for Gm pos cocci.  He is on vanco

## 2014-06-10 NOTE — Progress Notes (Signed)
Right unna boot too tight. Patients foot is swollen. Pt states that the unna boot should have been changed today. Ortho tech notified. He is going to replace the dressings.

## 2014-06-10 NOTE — Progress Notes (Signed)
Pt HD completed w/ no complications. Pt alert, vss, no c/o. 2L fluid removed. Report called to primary RN. Pt. Returned safely to room.

## 2014-06-10 NOTE — Progress Notes (Signed)
Patient ID: Chad [name], male   DOB: December 03, 1955, 59 y.o.   MRN: [id number] Patient in hemodialysis now.  Will check wounds tomorrow  [name] A. Gwinda Passe Physician Assistant [facility] [phone]  06/10/2014, 10:48 AM

## 2014-06-11 DIAGNOSIS — Z992 Dependence on renal dialysis: Secondary | ICD-10-CM

## 2014-06-11 DIAGNOSIS — I4892 Unspecified atrial flutter: Secondary | ICD-10-CM

## 2014-06-11 DIAGNOSIS — N186 End stage renal disease: Secondary | ICD-10-CM

## 2014-06-11 LAB — GLUCOSE, CAPILLARY
GLUCOSE-CAPILLARY: 107 mg/dL — AB (ref 65–99)
Glucose-Capillary: 88 mg/dL (ref 65–99)
Glucose-Capillary: 77 mg/dL (ref 65–99)
Glucose-Capillary: 91 mg/dL (ref 65–99)

## 2014-06-11 LAB — TISSUE CULTURE: Culture: NO GROWTH

## 2014-06-11 LAB — VITAMIN B12: Vitamin B-12: 251 pg/mL (ref 180–914)

## 2014-06-11 MED ORDER — HEPARIN SODIUM (PORCINE) 5000 UNIT/ML IJ SOLN
5000.0000 [IU] | Freq: Three times a day (TID) | INTRAMUSCULAR | Status: DC
  Administered 2014-06-12 – 2014-06-19 (×19): 5000 [IU] via SUBCUTANEOUS
  Filled 2014-06-11 (×19): qty 1

## 2014-06-11 MED ORDER — DARBEPOETIN ALFA 100 MCG/0.5ML IJ SOSY
100.0000 ug | PREFILLED_SYRINGE | INTRAMUSCULAR | Status: DC
  Administered 2014-06-12 – 2014-06-19 (×2): 100 ug via INTRAVENOUS
  Filled 2014-06-11 (×2): qty 0.5

## 2014-06-11 MED ORDER — CEFTRIAXONE SODIUM IN DEXTROSE 40 MG/ML IV SOLN
2.0000 g | INTRAVENOUS | Status: DC
  Administered 2014-06-11 – 2014-06-13 (×3): 2 g via INTRAVENOUS
  Filled 2014-06-11 (×4): qty 50

## 2014-06-11 NOTE — Progress Notes (Signed)
Subjective: 3 Days Post-Op Procedure(s) (LRB): INCISION AND DRAINAGE OF LEFT THIGH (Left) Patient reports pain as 2 on 0-10 scale.   Patient is doing much better.    Pleased with his progrogress Objective: Vital signs in last 24 hours: Temp:  [97.9 F (36.6 C)-98.6 F (37 C)] 97.9 F (36.6 C) (05/15 0605) Pulse Rate:  [68-75] 70 (05/15 0605) Resp:  [14-19] 16 (05/15 0605) BP: (93-110)/(46-60) 103/46 mmHg (05/15 0605) SpO2:  [92 %-98 %] 98 % (05/15 0605)  Intake/Output from previous day: 05/14 0701 - 05/15 0700 In: 310 [P.O.:240; I.V.:70] Out: 1605 [Urine:155] Intake/Output this shift: Total I/O In: 240 [P.O.:240] Out: -    Recent Labs  06/08/14 0948 06/08/14 1700 06/08/14 2140 06/09/14 0429 06/10/14 0428  HGB 13.9 10.2* 9.8* 8.5* 7.6*    Recent Labs  06/09/14 0429 06/10/14 0428  WBC 18.3* 16.8*  RBC 2.93* 2.63*  HCT 26.9* 23.7*  PLT 161 130*    Recent Labs  06/09/14 0429 06/10/14 0428  NA 130* 131*  K 3.6 4.2  CL 94* 96*  CO2 24 25  BUN 26* 38*  CREATININE 4.07* 4.89*  GLUCOSE 140* 126*  CALCIUM 8.3* 8.1*   No results for input(s): LABPT, INR in the last 72 hours.  Neurovascular intact Sensation intact distally Incision: moderate drainage  Assessment/Plan: 3 Days Post-Op Procedure(s) (LRB): INCISION AND DRAINAGE OF LEFT THIGH (Left) Active Problems:   Osteomyelitis   Shock   Arterial hypotension   ESRD (end stage renal disease)  New dressing applied to left leg by me today.  Much less swelling.  Some medial swelling redness and warmth on proximal medial gastroc above the unaboot.  Discussed with patient and will continue to watch.  Drains left it as still getting a small amount of drainage.  Chad [name] J 06/11/2014, 9:27 AM

## 2014-06-11 NOTE — Progress Notes (Addendum)
INFECTIOUS DISEASE PROGRESS NOTE  ID: Chad [name] is a 58 y.o. male with  Active Problems:   Osteomyelitis   Shock   Arterial hypotension   ESRD (end stage renal disease)  Subjective: Some stinging in leg  Abtx:  Anti-infectives    Start     Dose/Rate Route Frequency Ordered Stop   06/12/14 1200  vancomycin (VANCOCIN) IVPB 1000 mg/200 mL premix  Status:  Discontinued     1,000 mg 200 mL/hr over 60 Minutes Intravenous Every M-W-F (Hemodialysis) 06/09/14 1334 06/10/14 1235   06/12/14 1200  meropenem (MERREM) 500 mg in sodium chloride 0.9 % 50 mL IVPB  Status:  Discontinued     500 mg 100 mL/hr over 30 Minutes Intravenous Every M-W-F (Hemodialysis) 06/09/14 1334 06/09/14 1501   06/10/14 1200  meropenem (MERREM) 500 mg in sodium chloride 0.9 % 50 mL IVPB  Status:  Discontinued     500 mg 100 mL/hr over 30 Minutes Intravenous Every Sat (Hemodialysis) 06/09/14 1334 06/09/14 1501   06/10/14 1200  vancomycin (VANCOCIN) IVPB 1000 mg/200 mL premix     1,000 mg 200 mL/hr over 60 Minutes Intravenous Every Sat (Hemodialysis) 06/09/14 1334 06/10/14 1142   06/09/14 1800  meropenem (MERREM) 500 mg in sodium chloride 0.9 % 50 mL IVPB     500 mg 100 mL/hr over 30 Minutes Intravenous Daily after supper 06/09/14 1501     06/08/14 1900  piperacillin-tazobactam (ZOSYN) IVPB 2.25 g  Status:  Discontinued     2.25 g 100 mL/hr over 30 Minutes Intravenous 3 times per day 06/08/14 1134 06/08/14 1812   06/08/14 1830  meropenem (MERREM) 500 mg in sodium chloride 0.9 % 50 mL IVPB  Status:  Discontinued     500 mg 100 mL/hr over 30 Minutes Intravenous Every M-W-F (Hemodialysis) 06/08/14 1824 06/09/14 1316   06/08/14 1800  vancomycin (VANCOCIN) IVPB 1000 mg/200 mL premix  Status:  Discontinued     1,000 mg 200 mL/hr over 60 Minutes Intravenous Every M-W-F (Hemodialysis) 06/08/14 1749 06/09/14 1316   06/08/14 1232  tobramycin (NEBCIN) injection  Status:  Discontinued       As needed 06/08/14 1234  06/08/14 1501   06/08/14 1231  vancomycin (VANCOCIN) powder  Status:  Discontinued       As needed 06/08/14 1232 06/08/14 1501   06/08/14 1100  piperacillin-tazobactam (ZOSYN) IVPB 3.375 g  Status:  Discontinued     3.375 g 100 mL/hr over 30 Minutes Intravenous  Once 06/08/14 1046 06/08/14 1055   06/08/14 1100  piperacillin-tazobactam (ZOSYN) IVPB 3.375 g     3.375 g 100 mL/hr over 30 Minutes Intravenous On call to O.R. 06/08/14 1055 06/08/14 1335   06/08/14 0930  vancomycin (VANCOCIN) IVPB 1000 mg/200 mL premix     1,000 mg 200 mL/hr over 60 Minutes Intravenous On call to O.R. 06/08/14 0929 06/08/14 1105      Medications:  Scheduled: . allopurinol  100 mg Oral BID  . amiodarone  100 mg Oral Daily  . aspirin EC  325 mg Oral Daily  . calcium acetate  667 mg Oral TID WC  . carvedilol  12.5 mg Oral BID WC  . Chlorhexidine Gluconate Cloth  6 each Topical Q0600  . [START ON 06/12/2014] darbepoetin (ARANESP) injection - DIALYSIS  100 mcg Intravenous Q Mon-HD  . docusate sodium  100 mg Oral BID  . doxercalciferol  1 mcg Intravenous Q M,W,F-HD  . [START ON 06/12/2014] heparin subcutaneous  5,000 Units Subcutaneous 3  times per day  . isosorbide mononitrate  60 mg Oral Daily  . meropenem (MERREM) IV  500 mg Intravenous QPC supper  . multivitamin  1 tablet Oral QHS  . mupirocin ointment  1 application Nasal BID  . pravastatin  40 mg Oral q1800  . predniSONE  5 mg Oral Q breakfast    Objective: Vital signs in last 24 hours: Temp:  [97.9 F (36.6 C)-98.6 F (37 C)] 97.9 F (36.6 C) (05/15 0605) Pulse Rate:  [68-75] 70 (05/15 0605) Resp:  [14-18] 16 (05/15 0605) BP: (93-110)/(46-60) 103/46 mmHg (05/15 0605) SpO2:  [92 %-98 %] 98 % (05/15 0605)   General appearance: alert, cooperative and no distress Resp: clear to auscultation bilaterally Cardio: regular rate and rhythm GI: normal findings: bowel sounds normal and soft, non-tender Extremities: LLE wrapped  Lab Results  Recent  Labs  06/09/14 0429 06/10/14 0428  WBC 18.3* 16.8*  HGB 8.5* 7.6*  HCT 26.9* 23.7*  NA 130* 131*  K 3.6 4.2  CL 94* 96*  CO2 24 25  BUN 26* 38*  CREATININE 4.07* 4.89*   Liver Panel  Recent Labs  06/08/14 1908 06/10/14 0428  ALBUMIN 2.9* 2.2*   Sedimentation Rate No results for input(s): ESRSEDRATE in the last 72 hours. C-Reactive Protein No results for input(s): CRP in the last 72 hours.  Microbiology: Recent Results (from the past 240 hour(s))  Anaerobic culture     Status: None (Preliminary result)   Collection Time: 06/08/14 11:55 AM  Result Value Ref Range Status   Specimen Description TISSUE THIGH LEFT  Final   Special Requests NONE  Final   Gram Stain   Final    ABUNDANT WBC PRESENT,BOTH PMN AND MONONUCLEAR ABUNDANT GRAM POSITIVE COCCI IN CLUSTERS IN PAIRS Performed at [facility] Gram Stain Report Called to,Read Back By and Verified With: Gram Stain Report Called to,Read Back By and Verified With: Mendel Corning RN 1320 06/08/14 [place] WILSONM Performed at [facility]   Final    NO ANAEROBES ISOLATED; CULTURE IN PROGRESS FOR 5 DAYS Performed at [facility]    Report Status PENDING  Incomplete  Gram stain     Status: None   Collection Time: 06/08/14 11:55 AM  Result Value Ref Range Status   Specimen Description TISSUE THIGH LEFT  Final   Special Requests NONE  Final   Gram Stain   Final    ABUNDANT WBC PRESENT,BOTH PMN AND MONONUCLEAR ABUNDANT GRAM POSITIVE COCCI IN PAIRS IN CLUSTERS Gram Stain Report Called to,Read Back By and Verified With: Mendel Corning RN 13:20 06/08/14 ([email or web address])    Report Status 06/08/2014 FINAL  Final  Tissue culture     Status: None   Collection Time: 06/08/14 11:55 AM  Result Value Ref Range Status   Specimen Description TISSUE THIGH LEFT  Final   Special Requests NONE  Final   Gram Stain   Final    ABUNDANT WBC PRESENT,BOTH PMN AND MONONUCLEAR ABUNDANT GRAM POSITIVE COCCI IN CLUSTERS IN PAIRS  Performed at [facility] Gram Stain Report Called to,Read Back By and Verified With: Gram Stain Report Called to,Read Back By and Verified With: Mendel Corning RN 1320 06/08/14 BY Jasper Riling Performed at [facility]    Culture   Final    MODERATE PROTEUS MIRABILIS Performed at [facility]    Report Status 06/11/2014 FINAL  Final   Organism ID, Bacteria PROTEUS MIRABILIS  Final  Susceptibility   Proteus mirabilis - MIC*    AMPICILLIN >=32 RESISTANT Resistant     AMPICILLIN/SULBACTAM >=32 RESISTANT Resistant     CEFAZOLIN >=64 RESISTANT Resistant     CEFEPIME <=1 SENSITIVE Sensitive     CEFTAZIDIME 4 SENSITIVE Sensitive     CEFTRIAXONE 4 SENSITIVE Sensitive     CIPROFLOXACIN <=0.25 SENSITIVE Sensitive     GENTAMICIN <=1 SENSITIVE Sensitive     IMIPENEM 2 SENSITIVE Sensitive     PIP/TAZO <=4 SENSITIVE Sensitive     TOBRAMYCIN <=1 SENSITIVE Sensitive     TRIMETH/SULFA <=20 SENSITIVE Sensitive     * MODERATE PROTEUS MIRABILIS  Anaerobic culture     Status: None (Preliminary result)   Collection Time: 06/08/14  1:58 PM  Result Value Ref Range Status   Specimen Description TISSUE  Final   Special Requests REAMINGS OF LEFT FEMUR PT ON ANCEF AND ZOSYN  Final   Gram Stain   Final    ABUNDANT WBC PRESENT,BOTH PMN AND MONONUCLEAR NO ORGANISMS SEEN Performed at [facility] Performed at [facility]    Culture   Final    NO ANAEROBES ISOLATED; CULTURE IN PROGRESS FOR 5 DAYS Performed at [facility]    Report Status PENDING  Incomplete  Gram stain     Status: None   Collection Time: 06/08/14  1:58 PM  Result Value Ref Range Status   Specimen Description TISSUE  Final   Special Requests REAMINGS OF LEFT FEMUR  Final   Gram Stain   Final    ABUNDANT WBC PRESENT,BOTH PMN AND MONONUCLEAR NO ORGANISMS SEEN    Report Status 06/08/2014 FINAL  Final  Tissue culture     Status: None   Collection Time: 06/08/14  1:58 PM  Result Value Ref Range  Status   Specimen Description TISSUE  Final   Special Requests REAMINGS OF LEFT FEMUR  Final   Gram Stain   Final    ABUNDANT WBC PRESENT,BOTH PMN AND MONONUCLEAR NO ORGANISMS SEEN Performed at [facility] Performed at [facility]    Culture   Final    NO GROWTH 3 DAYS Performed at [facility]    Report Status 06/11/2014 FINAL  Final  MRSA PCR Screening     Status: Abnormal   Collection Time: 06/08/14  6:15 PM  Result Value Ref Range Status   MRSA by PCR POSITIVE (A) NEGATIVE Final    Comment:        The GeneXpert MRSA Assay (FDA approved for NASAL specimens only), is one component of a comprehensive MRSA colonization surveillance program. It is not intended to diagnose MRSA infection nor to guide or monitor treatment for MRSA infections. RESULT CALLED TO, READ BACK BY AND VERIFIED WITH: RN,GREG HARDUK [id number] @[id number]  THANEY   Culture, blood (routine x 2)     Status: None (Preliminary result)   Collection Time: 06/08/14  7:08 PM  Result Value Ref Range Status   Specimen Description BLOOD RIGHT ANTECUBITAL  Final   Special Requests   Final    BOTTLES DRAWN AEROBIC AND ANAEROBIC 5CCBLUE 3CC RED   Culture   Final           BLOOD CULTURE RECEIVED NO GROWTH TO DATE CULTURE WILL BE HELD FOR 5 DAYS BEFORE ISSUING A FINAL NEGATIVE REPORT Note: Culture results may be compromised due to an inadequate volume of blood received in culture bottles. Performed at [facility]    Report Status PENDING  Incomplete  Culture, blood (routine x 2)     Status: None (Preliminary result)   Collection Time: 06/08/14  7:20 PM  Result Value Ref Range Status   Specimen Description BLOOD RIGHT HAND  Final   Special Requests BOTTLES DRAWN AEROBIC ONLY  Final   Culture   Final           BLOOD CULTURE RECEIVED NO GROWTH TO DATE CULTURE WILL BE HELD FOR 5 DAYS BEFORE ISSUING A FINAL NEGATIVE REPORT Performed at [facility]    Report Status PENDING   Incomplete  Urine culture     Status: None   Collection Time: 06/09/14  7:03 AM  Result Value Ref Range Status   Specimen Description URINE, RANDOM  Final   Special Requests NONE  Final   Colony Count NO GROWTH Performed at [facility]   Final   Culture NO GROWTH Performed at [facility]   Final   Report Status 06/10/2014 FINAL  Final    Studies/Results: No results found.   Assessment/Plan: Shock/sepsis Osteomyelitis L femur Wound infection ? 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 His g/s appeared to show staph but this did not grow. Ceftriaxone has some staph coverage. Non-MRSA Would query if he could go to ceftaz with hd? He is improving.          Johny Sax Infectious Diseases (pager) [phone] [email or web address] 06/11/2014, 10:52 AM  LOS: 3 days

## 2014-06-11 NOTE — Consult Note (Addendum)
Medical Consultation  Chad [name] GBE:[id number] DOB: [birth year] DOA: 06/08/2014 PCP: Ulla Potash., MD   Requesting physician: Dr. Marcelino Scot Date of consultation: 06/11/14 Reason for consultation: medical management  Impression/Recommendations Hypotension -Multifactorial including sepsis, post anesthetic effect, and possible adrenal insufficiency in the setting of 3 antihypertensive medications -Has been weaned off of vasopressors -Blood pressure remains soft but stable -Has been weaned back to baseline steroids -Continue carvedilol at lower dose -Resume Imdur at lower dose --patient states that he had only been taking Imdur 60 mg daily for the past month prior to admission rather than the 90 mg which was listed -Hold amlodipine and hydralazine for now Osteomyelitis left femur -Status post I&D 06/08/2014, Dr. Marcelino Scot -Wound care per orthopedics -meropenem-->ceftriaxone per ID -Antibiotics per infectious disease -Blood cultures remain negative -Patient is afebrile and hemodynamically stable albeit blood pressures remained soft -Physical therapy evaluation when orthopedics feels the patient is stable -Patient ultimately plans to go back home after this hospitalization--he does not want to go to skilled nursing facility -ESR 38, CRP 5.0 Chronic systolic CHF/nonischemic cardiomyopathy -Previously had EF as low as 15-20% -Follows Dr. Haroldine Laws -Most recent echocardiogram 06/13/2013 showed EF 60% -As discussed above, resume lower dose carvedilol and resume Imdur one blood pressure allows -Appears a little on the hypervolemic side, but this is managed with dialysis patient states that he has had Unna boots on his bilateral lower extremities for worsening leg edema for the past month which was attributable to the patient's worsening renal function  -He is not in any respiratory distress--his mild hypoxemia likely due to atelectasis --oxygen saturation 98-99 percent on 2 L  -Wean oxygen for  oxygen saturation greater than 92%  -Encourage incentive spirometry Thrombocytopenia -This has been chronic dating back to November 2013 -likely due to chronic hep C with 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 -largely stable since admission -Intermittent worsening likely due to the patient's infectious process with associated myelosuppression -Monitor for bleeding complications -check X58 with am labs -HIT panel if continued drop 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 -Dialysis per nephrology -Patient has only resumed hemodialysis within the past month  -AV fistula creation 83/25/4982  -Metabolic bone disease per nephrology -Dialysis Monday, Wednesday, Friday -Continue chronic home dose of prednisone 5 mg daily Paroxysmal atrial flutter  -Presently in sinus rhythm  -Not on anticoagulation secondary to GI bleed  -Continue aspirin  -Continue carvedilol  -Continue amiodarone  -FOBT Anemia of CKD -Patient's baseline hemoglobin has had wide variations but baseline seems to be around 11-12 -Drop in hemoglobin likely dilutional and surgery -Aranesp per nephrology -Repeat CBC in a.m.--transfuse for hemoglobin less than 7  Hypertension -Continue carvedilol and imdur only at this time as discussed above  -Resume other antihypertensive medication as blood pressure allows  Hyperlipidemia -Continue pravastatin  I will followup again tomorrow. Please contact me if I can be of assistance in the meanwhile. Thank you for this consultation.  Chief Complaint: drainged from left leg  HPI:  59 year old male with a history of nonischemic cardiomyopathy, paroxysmal atrial flutter not on anticoagulation secondary to GI bleed, ESRD (MWF) with failed renal transplant (1996), hepatitis C, and osteomyelitis of the left femur. The patient initially had a distal femur fracture in June 2014 and underwent ORIF at that time. He subsequently developed a wound infection and underwent irrigation and debridement. He had been  following infectious disease. On 03/14/2014, the patient had his hardware removed and had placement of antibiotic beads. He was seen in the orthopedic office on 06/07/2014. He  was noted to have purulent drainage. He was admitted on 06/08/2014 for repeat irrigation and debridement. Postoperatively he was extubated successfully. Unfortunately, the patient developed hypotension that was refractory to 3 doses of colloid infusion. Critical care was consulted, and the patient was transferred to the ICU where he was placed on vasopressors. It was thought that the patient was possibly septic secondary to his wound infection and osteomyelitis as well as residual effects from his anesthesia. He was initially placed on stress steroids which have been weaned to his baseline dose. He has been since weaned off of his vasopressors and was transferred out of the ICU on 06/10/2014. As a result, internal medicine consultation was obtained for medical management. During this hospitalization, nephrology has been consulted for his dialysis needs. In addition, infectious disease has been following for his antibiotic regimen. Presently, the patient remains on meropenem.  Review of Systems:  Constitutional:  No weight loss, night sweats, Fevers, chills, fatigue.  Head&Eyes: No headache.  No vision loss.  No eye pain or scotoma ENT:  No Difficulty swallowing,Tooth/dental problems,Sore throat,  No ear ache, post nasal drip,  Cardio-vascular:  No chest pain, Orthopnea, PND, swelling in lower extremities,  dizziness, palpitations  GI:  No heartburn, indigestion, abdominal pain, nausea, vomiting, diarrhea, loss of appetite, hematochezia, melena Resp:  No shortness of breath with exertion or at rest. No excess mucus, no productive cough, No non-productive cough, No coughing up of blood.No change in color of mucus.No wheezing.No chest wall deformity  Skin:  no rash or lesions.  GU:  no dysuria, change in color of urine, no  urgency or frequency. No flank pain.  Musculoskeletal:  No joint pain or swelling. No decreased range of motion. No back pain.  Psych:  No change in mood or affect. No depression or anxiety. Neurologic: No headache, no dysesthesia, no focal weakness, no vision loss. No syncope   Past Medical History  Diagnosis Date  . CKD (chronic kidney disease), stage IV     a. s/p renal transplant 1996.  Marland Kitchen HLD (hyperlipidemia)     takes Pravastatin daily  . Nonischemic cardiomyopathy     a. unknown etiology, EF 30-35% by echo 09/2011;  b. 09/2011 Lexi MV EF 42%, no ischemia/infarct.  . Systolic CHF     takes Lasix daily  . Atrial flutter     takes Amiodarone daily  . Gout     takes Allopurinol daily  . Hypertension     takes Amlodipine,Hydralazine,Imdur,and Coreg daily  . GERD (gastroesophageal reflux disease)     takes Protonix daily  . Shortness of breath dyspnea     exertion  . Pneumonia 2014  . History of bronchitis     several yrs ago  . Joint pain     right shoulder and left knee  . Sciatica   . Hepatitis C 1996    Hep C  . History of colon polyps   . History of kidney stones   . Urinary frequency   . History of shingles   . Hyperparathyroidism due to renal insufficiency   . Sleep apnea     does not use cpap  . Wound of left leg    Past Surgical History  Procedure Laterality Date  . Nephrectomy transplanted organ Right 40yr ago  . Insertion of dialysis catheter  06/20/2011    Procedure: INSERTION OF DIALYSIS CATHETER;  Surgeon: VSerafina Mitchell MD;  Location[place] [facility]  Service: Vascular;  Laterality: Right;  Ultrasound guided insertion  of right internal jugular dialysis catheter  . Multiple extractions with alveoloplasty  06/27/2011    Procedure: MULTIPLE EXTRACION WITH ALVEOLOPLASTY;  Surgeon: Lenn Cal, DDS;  Location: [place];  Service: Oral Surgery;  Laterality: N/A;  Extraction  of tooth # 14 with alveoloplasty  . Hip surgery Left     replacement  . Joint  replacement      left hip  . Femur fracture surgery Left 07/09/2012    Dr Sherrian Divers  . Femur im nail Left 07/10/2012    ORIF L distal femur;  Surgeon: Wylene Simmer, MD;  Location: [place];  Service: Orthopedics;  Laterality: Left;  . I&d extremity Left 08/24/2012    Procedure: LEFT LEG IRRIGATION AND DEBRIDEMENT WITH POSSIBLE WOUND VAC APPLICATION;  Surgeon: Wylene Simmer, MD;  Location: [place];  Service: Orthopedics;  Laterality: Left;  . Tracheostomy  2014  . Tracheostomy closure  2014    2 wks after it was done  . Colonsocopy    . Non union Left 03/14/2014    LEFT FEMER REPAIR     BY DR HANDY  . Hardware removal Left 03/14/2014    Procedure: HARDWARE REMOVAL LEFT;  Surgeon: Altamese [place] Den[place], MD;  Location: [place];  Service: Orthopedics;  Laterality: Left;  . Wound debridement Left 03/14/2014    Procedure: DEBRIDEMENT WOUND;  Surgeon: Altamese [place], MD;  Location: [place];  Service: Orthopedics;  Laterality: Left;  INSERTION OF  ANTIBIOTIC Beads  (2 strands: 15 beads and 14 beads)  . Av fistula placement Left 06/01/2014    Procedure: LEFT BRACHIOCEPHALIC ARTERIOVENOUS (AV) FISTULA CREATION;  Surgeon: Mal Misty, MD;  Location: [place];  Service: Vascular;  Laterality: Left;   Social History:  reports that he has never smoked. He has never used smokeless tobacco. He reports that he does not drink alcohol or use illicit drugs.  Family History  Problem Relation Age of Onset  . Heart disease Neg Hx     No Known Allergies   Prior to Admission medications   Medication Sig Start Date End Date Taking? Authorizing Provider  allopurinol (ZYLOPRIM) 100 MG tablet Take 1 tablet (100 mg total) by mouth 2 (two) times daily. 08/05/12  Yes Daniel J Angiulli, PA-C  amiodarone (PACERONE) 100 MG tablet TAKE 1 TABLET BY MOUTH EVERY DAY 09/19/13  Yes Larey Dresser, MD  amiodarone (PACERONE) 100 MG tablet Take 1 tablet (100 mg total) by mouth daily. 06/05/14  Yes Shaune Pascal Bensimhon, MD  amLODipine (NORVASC) 2.5 MG tablet TAKE  1 TABLET (2.5 MG TOTAL) BY MOUTH DAILY. 05/08/14  Yes Shaune Pascal Bensimhon, MD  amLODipine (NORVASC) 2.5 MG tablet TAKE 1 TABLET (2.5 MG TOTAL) BY MOUTH DAILY. 06/05/14  Yes Jolaine Artist, MD  amoxicillin-clavulanate (AUGMENTIN) 500-125 MG per tablet TAKE 1 TABLET BY MOUTH TWICE A DAY 05/25/14  Yes Michel Bickers, MD  aspirin 325 MG tablet Take 325 mg by mouth daily.   Yes Historical Provider, MD  calcitRIOL (ROCALTROL) 0.5 MCG capsule Take 0.5 mcg by mouth daily. 02/02/14  Yes Historical Provider, MD  carvedilol (COREG) 25 MG tablet TAKE 1 TABLET TWICE DAILY WITH A MEAL 02/27/14  Yes Jolaine Artist, MD  Cholecalciferol (VITAMIN D-3) 5000 UNITS TABS Take 5,000 Units by mouth daily.    Yes Historical Provider, MD  docusate sodium (COLACE) 100 MG capsule Take 1 capsule (100 mg total) by mouth 2 (two) times daily. 03/22/14  Yes Ainsley Spinner, PA-C  hydrALAZINE (APRESOLINE) 100 MG tablet  TAKE 1 TABLET (100 MG TOTAL) BY MOUTH 3 (THREE) TIMES DAILY. 02/14/14  Yes Amy D Clegg, NP  isosorbide mononitrate (IMDUR) 60 MG 24 hr tablet TAKE 1.5 TABLETS BY MOUTH EVERY DAY 03/02/14  Yes Shaune Pascal Bensimhon, MD  metolazone (ZAROXOLYN) 2.5 MG tablet TAKE 1 TABLET BY MOUTH 30 MINUTES BEFORE YOUR MORNING LASIX ON TUES, THURS, AND SAT MORNINGS 06/05/14  Yes Jolaine Artist, MD  oxyCODONE (OXY IR/ROXICODONE) 5 MG immediate release tablet Take 1-2 tablets (5-10 mg total) by mouth every 3 (three) hours as needed for breakthrough pain. 03/22/14  Yes Ainsley Spinner, PA-C  pravastatin (PRAVACHOL) 40 MG tablet TAKE 1 TABLET (40 MG TOTAL) BY MOUTH DAILY.   Yes Larey Dresser, MD  predniSONE (DELTASONE) 10 MG tablet Take 1 tablet (10 mg total) by mouth daily. Patient taking differently: Take 10 mg by mouth daily. Pt. Taking 1/2 tablet daily 08/05/12  Yes Daniel J Angiulli, PA-C  Probiotic Product (PROBIOTIC DAILY PO) Take 1 capsule by mouth daily.    Yes Historical Provider, MD  traMADol (ULTRAM) 50 MG tablet Take 1 tablet (50 mg total) by  mouth every 6 (six) hours as needed (pain). As needed for pain 06/01/14  Yes Ulyses Amor, PA-C  acetaminophen (TYLENOL) 325 MG tablet Take 2 tablets (650 mg total) by mouth every 6 (six) hours as needed for moderate pain or fever. 03/22/14   Ainsley Spinner, PA-C    Physical Exam: Filed Vitals:   06/10/14 1800 06/10/14 2011 06/10/14 2013 06/11/14 0605  BP: 101/54 93/52  103/46  Pulse:  75  70  Temp:  97.9 F (36.6 C)  97.9 F (36.6 C)  TempSrc:  Oral  Oral  Resp: _0 Height:      Weight:      SpO2: 97% 92% 97% 98%   General:  A&O x 3, NAD, nontoxic, pleasant/cooperative Head/Eye: No conjunctival hemorrhage, no icterus, [place]/AT, No nystagmus ENT:  No icterus,  No thrush, good dentition, no pharyngeal exudate Neck:  No masses, no lymphadenpathy, no bruits CV:  RRR, no rub, no gallop, no S3 Lung:  CTAB, good air movement, no wheeze, no rhonchi Abdomen: soft/NT, +BS, nondistended, no peritoneal signs Ext: No cyanosis, No rashes, No petechiae, No lymphangitis, No edema Neuro: CNII-XII intact, strength 4/5 in bilateral upper and lower extremities, no dysmetria  Labs on Admission:  Basic Metabolic Panel:  Recent Labs Lab 06/08/14 0948 06/08/14 1908 06/09/14 0429 06/10/14 0428  NA 137 134* 130* 131*  K 3.4* 3.6 3.6 4.2  CL 97* 96* 94* 96*  CO2 _1 GLUCOSE 77 107* 140* 126*  BUN 21* 22* 26* 38*  CREATININE 3.42* 3.78* 4.07* 4.89*  CALCIUM 9.0 8.5* 8.3* 8.1*  MG  --   --  1.7  --   PHOS  --  5.3* 5.3* 6.1*   Liver Function Tests:  Recent Labs Lab 06/08/14 0948 06/08/14 1908 06/10/14 0428  AST 19  --   --   ALT 7*  --   --   ALKPHOS 65  --   --   BILITOT 0.5  --   --   PROT 8.0  --   --   ALBUMIN 2.4* 2.9* 2.2*   No results for input(s): LIPASE, AMYLASE in the last 168 hours. No results for input(s): AMMONIA in the last 168 hours. CBC:  Recent Labs Lab 06/08/14 0948 06/08/14 1700 06/08/14 2140 06/09/14 0429 06/10/14 0428  WBC 11.9*  --  16.6*  18.3* 16.8*  NEUTROABS 7.2  --   --   --   --   HGB 13.9 10.2* 9.8* 8.5* 7.6*  HCT 42.5 33.0* 31.6* 26.9* 23.7*  MCV 92.0  --  92.7 91.8 90.1  PLT 140*  --  156 161 130*   Cardiac Enzymes: No results for input(s): CKTOTAL, CKMB, CKMBINDEX, TROPONINI in the last 168 hours. BNP: Invalid input(s): POCBNP CBG:  Recent Labs Lab 06/08/14 1806 06/10/14 2151 06/11/14 0630  GLUCAP 78 115* 88    Radiological Exams on Admission: No results found.    Time spent: 70 min  Garlene Apperson [name] Pager [phone]  If 7PM-7AM, please contact night-coverage [email or web address] Password [email or web address] 06/11/2014, 11:04 AM

## 2014-06-11 NOTE — Progress Notes (Signed)
S/W Julien Girt, PA about hemovac and she instructed me to remove the rest of it and to apply 4x4s over the site.

## 2014-06-11 NOTE — Evaluation (Signed)
Occupational Therapy Evaluation Patient Details Name: Chad [name] MRN: [id number] DOB: 10-14-1955 Today's Date: 06/11/2014    History of Present Illness Pt is a 59 y/o male with a PMH of CKD s/p renal transplant, cardiomyopathy, CHF, Hep C. 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 Pt with previous IM nailing, and hardware was removed and replaced with abx beads in 02/2014 to try and address this. Pt is now s/p I&D of his L thigh/distal femur.    Clinical Impression   PTA pt lived at home and required assistance for ADLs. He reports that he was able to use sliding board to transfer to w/c level and family assisted with ADLs. Pt currently requires Mod A +2 to stand from elevated surface and requires max to total A for LB ADLs. Recommend SNF at this time. Pt will benefit from acute OT to progress independence in LB ADLs and transfers.     Follow Up Recommendations  SNF;Supervision/Assistance - 24 hour    Equipment Recommendations  None recommended by OT    Recommendations for Other Services       Precautions / Restrictions Precautions Precautions: Fall Precaution Comments: Unna boot on RLE - pt has wounds there as well Restrictions Weight Bearing Restrictions: Yes LLE Weight Bearing: Non weight bearing      Mobility Bed Mobility Overal bed mobility: Needs Assistance;+2 for physical assistance Bed Mobility: Supine to Sit;Sit to Supine     Supine to sit: Min assist;+2 for safety/equipment Sit to supine: Min assist;+2 for physical assistance   General bed mobility comments: Assist for LE off and onto bed. Pt with good use of UEs to support trunk movement and elevation. Pt able to scoot to EOB and LLE was managed.   Transfers Overall transfer level: Needs assistance Equipment used: Rolling walker (2 wheeled) Transfers: Sit to/from Stand Sit to Stand: Mod assist;+2 physical assistance;From elevated surface         General transfer comment: Pt  stood at EOB for ~10 seconds before sitting. NWB on LLE but unable to hold up off the ground.     Balance Overall balance assessment: History of Falls Sitting-balance support: Feet supported;No upper extremity supported Sitting balance-Leahy Scale: Fair     Standing balance support: Bilateral upper extremity supported;During functional activity Standing balance-Leahy Scale: Poor                              ADL Overall ADL's : Needs assistance/impaired Eating/Feeding: Independent;Sitting   Grooming: Set up;Sitting   Upper Body Bathing: Set up;Sitting   Lower Body Bathing: Maximal assistance;+2 for physical assistance;Sit to/from stand   Upper Body Dressing : Set up;Sitting   Lower Body Dressing: Total assistance;+2 for physical assistance;Sit to/from stand   Toilet Transfer: Moderate assistance;+2 for physical assistance;RW Toilet Transfer Details (indicate cue type and reason): sit<>stand from elevated EOB only. Pt reports pivoting is very difficult Toileting- Clothing Manipulation and Hygiene: Total assistance;+2 for physical assistance;Sit to/from stand         General ADL Comments: Pt is limited by LEs and weakness.      Vision Additional Comments: No change from baseline.          Pertinent Vitals/Pain Pain Assessment: Faces Faces Pain Scale: Hurts little more Pain Location: LLE; over knee Pain Descriptors / Indicators: Sharp Pain Intervention(s): Limited activity within patient's tolerance;Monitored during session;Repositioned     Hand Dominance Right   Extremity/Trunk  Assessment Upper Extremity Assessment Upper Extremity Assessment: Overall WFL for tasks assessed   Lower Extremity Assessment Lower Extremity Assessment: Defer to PT evaluation   Cervical / Trunk Assessment Cervical / Trunk Assessment: Normal   Communication Communication Communication: No difficulties   Cognition Arousal/Alertness: Awake/alert Behavior During Therapy:  WFL for tasks assessed/performed Overall Cognitive Status: Within Functional Limits for tasks assessed                                Home Living Family/patient expects to be discharged to:: Unsure                                        Prior Functioning/Environment Level of Independence: Needs assistance  Gait / Transfers Assistance Needed: Able to transfer with slide board to w/c and states he has not been able to SPT in a long time.  ADL's / Homemaking Assistance Needed: Pt states son assists him in getting washed up.         OT Diagnosis: Generalized weakness;Acute pain   OT Problem List: Decreased strength;Decreased range of motion;Decreased activity tolerance;Impaired balance (sitting and/or standing);Pain   OT Treatment/Interventions: Self-care/ADL training;Therapeutic exercise;Energy conservation;DME and/or AE instruction;Therapeutic activities;Patient/family education;Balance training    OT Goals(Current goals can be found in the care plan section) Acute Rehab OT Goals Patient Stated Goal: get back to where I was before OT Goal Formulation: With patient Time For Goal Achievement: 06/25/14 Potential to Achieve Goals: Good ADL Goals Pt Will Perform Lower Body Bathing: with min assist;sitting/lateral leans Pt Will Perform Lower Body Dressing: with min assist;sitting/lateral leans Pt Will Transfer to Toilet: with min assist;stand pivot transfer;bedside commode Pt Will Perform Toileting - Clothing Manipulation and hygiene: with min assist;sit to/from stand  OT Frequency: Min 2X/week    End of Session Equipment Utilized During Treatment: Gait belt;Rolling walker;Other (comment) [employer]er, retail) Nurse Communication: Mobility status;Other (comment) (hemovac leaking)  Activity Tolerance: Patient limited by fatigue Patient left: in bed;with call bell/phone within reach;with bed alarm set;with family/visitor present   Time: 1323-1400 OT Time Calculation  (min): 37 min Charges:  OT General Charges $OT Visit: 1 Procedure OT Evaluation $Initial OT Evaluation Tier I: 1 Procedure OT Treatments $Self Care/Home Management : 8-22 mins G-Codes:    Nena Jordan M 2014-06-22, 2:29 PM  Carney Living, OTR/L Occupational Therapist [phone] (pager)

## 2014-06-11 NOTE — Progress Notes (Signed)
Subjective:  No complaints, breathing well, comfortable in bed  Objective: Vital signs in last 24 hours: Temp:  [97.9 F (36.6 C)-98.6 F (37 C)] 97.9 F (36.6 C) (05/15 0605) Pulse Rate:  [68-75] 70 (05/15 0605) Resp:  [14-22] 16 (05/15 0605) BP: (93-110)/(46-60) 103/46 mmHg (05/15 0605) SpO2:  [92 %-98 %] 98 % (05/15 0605) Weight change:   Intake/Output from previous day: 05/14 0701 - 05/15 0700 In: 310 [P.O.:240; I.V.:70] Out: 1605 [Urine:155] Intake/Output this shift: Total I/O In: 240 [P.O.:240] Out: -   Lab Results:  Recent Labs  06/09/14 0429 06/10/14 0428  WBC 18.3* 16.8*  HGB 8.5* 7.6*  HCT 26.9* 23.7*  PLT 161 130*   BMET:  Recent Labs  06/08/14 1908 06/09/14 0429 06/10/14 0428  NA 134* 130* 131*  K 3.6 3.6 4.2  CL 96* 94* 96*  CO2 24 24 25   GLUCOSE 107* 140* 126*  BUN 22* 26* 38*  CREATININE 3.78* 4.07* 4.89*  CALCIUM 8.5* 8.3* 8.1*  ALBUMIN 2.9*  --  2.2*   No results for input(s): PTH in the last 72 hours. Iron Studies: No results for input(s): IRON, TIBC, TRANSFERRIN, FERRITIN in the last 72 hours.  Studies/Results: No results found.   EXAM: General appearance:  Alert, in no apparent distress Resp:  CTA without rales, rhonchi, or wheezes Cardio:  RRR without murmur or rub GI: + BS, soft and nontender Extremities:  No edema, L leg wrapped with 2 drains, R leg wrapped Access:  R IJ catheter, AVF @ LUA with + bruit  Dialysis Orders: [facility] 4hr 30 mins 103.5kgs 2K/2.5ca 5000u heparin Hectorol 1   Assessment/Plan: 1. L distal femur osteomyelitis - new resorbable beads placed, wound culture + for Proteus, now only on Meropenem per ID. 2. Septic shock - resolved, off pressors, moved from ICU 5/14. 3. ESRD - HD on MWF @ [facility], K 4.2.  HD tomorrow. 4. Hypotension/Volume - BP 103/46, Carvedilol 12.5 mg bid; wt 106.6 kg, no excess fluid. 5. Anemia - Hgb down to 7.6, no Aranesp.  Start Aranesp 100 mcg, check CBC with HD. 6. Sec  HPT - Ca 8.1 (9.5 corrected), P 6.1; Hectorol 1 mcg, Phoslo 1 with meals. 7. Nutrition - Alb 2.2, renal diet, vitamin.    LOS: 3 days   LYLES,CHARLES 06/11/2014,8:39 AM  I have seen and examined this patient and agree with plan per Gerome Apley.  Feeling well.  Plan HD tomorrow to get back on MWF schedule. Alle Difabio T,MD 06/11/2014 9:14 AM

## 2014-06-11 NOTE — Progress Notes (Signed)
PT worked with pt.  1 of the Hemovacs pulled out some and is exposing the small holes in the tubing.  K. Shepperson paged.

## 2014-06-12 ENCOUNTER — Encounter (HOSPITAL_COMMUNITY): Payer: Self-pay | Admitting: Orthopedic Surgery

## 2014-06-12 DIAGNOSIS — D62 Acute posthemorrhagic anemia: Secondary | ICD-10-CM

## 2014-06-12 LAB — CBC
HCT: 25.9 % — ABNORMAL LOW (ref 39.0–52.0)
Hemoglobin: 8.4 g/dL — ABNORMAL LOW (ref 13.0–17.0)
MCH: 29.8 pg (ref 26.0–34.0)
MCHC: 32.4 g/dL (ref 30.0–36.0)
MCV: 91.8 fL (ref 78.0–100.0)
PLATELETS: 173 10*3/uL (ref 150–400)
RBC: 2.82 MIL/uL — ABNORMAL LOW (ref 4.22–5.81)
RDW: 17.6 % — AB (ref 11.5–15.5)
WBC: 14.3 10*3/uL — ABNORMAL HIGH (ref 4.0–10.5)

## 2014-06-12 LAB — RENAL FUNCTION PANEL
ANION GAP: 12 (ref 5–15)
Albumin: 2.2 g/dL — ABNORMAL LOW (ref 3.5–5.0)
BUN: 32 mg/dL — ABNORMAL HIGH (ref 6–20)
CO2: 24 mmol/L (ref 22–32)
Calcium: 8.2 mg/dL — ABNORMAL LOW (ref 8.9–10.3)
Chloride: 96 mmol/L — ABNORMAL LOW (ref 101–111)
Creatinine, Ser: 4.31 mg/dL — ABNORMAL HIGH (ref 0.61–1.24)
GFR calc non Af Amer: 14 mL/min — ABNORMAL LOW (ref >60)
GFR, EST AFRICAN AMERICAN: 16 mL/min — AB (ref >60)
GLUCOSE: 87 mg/dL (ref 65–99)
POTASSIUM: 3.6 mmol/L (ref 3.5–5.1)
Phosphorus: 3.7 mg/dL (ref 2.5–4.6)
SODIUM: 132 mmol/L — AB (ref 135–145)

## 2014-06-12 LAB — OCCULT BLOOD X 1 CARD TO LAB, STOOL: FECAL OCCULT BLD: NEGATIVE

## 2014-06-12 LAB — PREALBUMIN: Prealbumin: 23.3 mg/dL (ref 18–38)

## 2014-06-12 MED ORDER — SODIUM CHLORIDE 0.9 % IJ SOLN
10.0000 mL | INTRAMUSCULAR | Status: DC | PRN
  Administered 2014-06-12 – 2014-06-16 (×9): 10 mL
  Administered 2014-06-17: 20 mL
  Administered 2014-06-17: 10 mL
  Administered 2014-06-18: 20 mL
  Administered 2014-06-18: 10 mL
  Filled 2014-06-12 (×13): qty 40

## 2014-06-12 MED ORDER — CARVEDILOL 6.25 MG PO TABS
6.2500 mg | ORAL_TABLET | Freq: Two times a day (BID) | ORAL | Status: DC
  Administered 2014-06-13: 6.25 mg via ORAL
  Filled 2014-06-12: qty 1

## 2014-06-12 MED ORDER — OXYCODONE HCL 5 MG PO TABS
ORAL_TABLET | ORAL | Status: AC
  Administered 2014-06-12: 19:00:00
  Filled 2014-06-12: qty 2

## 2014-06-12 MED ORDER — DOXERCALCIFEROL 4 MCG/2ML IV SOLN
INTRAVENOUS | Status: AC
  Administered 2014-06-12: 4 ug via INTRAVENOUS
  Filled 2014-06-12: qty 2

## 2014-06-12 MED ORDER — DARBEPOETIN ALFA 100 MCG/0.5ML IJ SOSY
PREFILLED_SYRINGE | INTRAMUSCULAR | Status: AC
  Administered 2014-06-12: 100 ug via INTRAVENOUS
  Filled 2014-06-12: qty 0.5

## 2014-06-12 NOTE — Progress Notes (Signed)
Physical Therapy Treatment Patient Details Name: Chad [name] MRN: [id number] DOB: [birth year] Today's Date: 06/12/2014    History of Present Illness Pt is a 59 y/o male with a PMH of CKD s/p renal transplant, cardiomyopathy, CHF, Hep C. 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 Pt with previous IM nailing, and hardware was removed and replaced with abx beads in 02/2014 to try and address this. Pt is now s/p I&D of his L thigh/distal femur.     PT Comments    Pt is very motivated to increase independence and strength and is still appropriate for the CIR setting.  However, if CIR is not approved pt will need to return to SNF upon d/c to receive additional therapy services.  Pt performed sit>stand x2 this session, endurance limited by R knee buckling.  Pt performed therapeutic exercises sitting EOB and supine in bed.   Follow Up Recommendations  CIR;Supervision/Assistance - 24 hour     Equipment Recommendations  None recommended by PT    Recommendations for Other Services Rehab consult     Precautions / Restrictions Precautions Precautions: Fall Precaution Comments: Unna boot on RLE - pt has wounds there as well Required Braces or Orthoses: Other Brace/Splint Other Brace/Splint: PRAFO LLE Restrictions Weight Bearing Restrictions: Yes LLE Weight Bearing: Non weight bearing    Mobility  Bed Mobility Overal bed mobility: Needs Assistance;+ 2 for safety/equipment Bed Mobility: Supine to Sit;Sit to Supine     Supine to sit: Min assist;+2 for safety/equipment Sit to supine: Min assist;+2 for physical assistance;+2 for safety/equipment   General bed mobility comments: Assist for LLE into and OOB.  Pt w/ support posteriorly using bed pad to guide pt's hips during sit>supine.    Transfers Overall transfer level: Needs assistance Equipment used: Rolling walker (2 wheeled) Transfers: Sit to/from Stand Sit to Stand: Mod assist;+2 physical assistance;From  elevated surface         General transfer comment: Verbal cues for hand placement, requiring mod assist +2 to power up to standing.  Sit>stand performed x2.  Pt stood EOB 5 sec and 10 sec for first and second attempts respectively.  Pt w/ R knee buckle reason for return to sitting EOB both times.  Ambulation/Gait                 Stairs            Wheelchair Mobility    Modified Rankin (Stroke Patients Only)       Balance Overall balance assessment: Needs assistance Sitting-balance support: Bilateral upper extremity supported;Feet supported Sitting balance-Leahy Scale: Fair     Standing balance support: Bilateral upper extremity supported;During functional activity Standing balance-Leahy Scale: Poor                      Cognition Arousal/Alertness: Awake/alert Behavior During Therapy: Flat affect Overall Cognitive Status: Within Functional Limits for tasks assessed                      Exercises Total Joint Exercises Ankle Circles/Pumps: AROM;Right;10 reps;Seated Quad Sets: AROM;Right;5 reps;Supine Hip ABduction/ADduction: AROM;Right;10 reps;Seated Long Arc Quad: AROM;AAROM;Right;Left;10 reps;Seated Marching in Standing: AROM;Both;10 reps;Seated    General Comments General comments (skin integrity, edema, etc.): Pt is extremely motivated to increase his independence.      Pertinent Vitals/Pain Pain Assessment: 0-10 Pain Score: 5  Pain Location: LLE knee down to L foot Pain Descriptors / Indicators: Aching Pain Intervention(s): Limited activity  within patient's tolerance;Monitored during session;Repositioned    Home Living                      Prior Function            PT Goals (current goals can now be found in the care plan section) Acute Rehab PT Goals Patient Stated Goal: get back to where I was before Progress towards PT goals: Progressing toward goals    Frequency  Min 3X/week    PT Plan Current plan  remains appropriate    Co-evaluation             End of Session Equipment Utilized During Treatment: Gait belt;Other (comment) (L PRAFO ) Activity Tolerance: Patient limited by fatigue Patient left: in bed;with call bell/phone within reach;with nursing/sitter in room     Time: 1610-9604 PT Time Calculation (min) (ACUTE ONLY): 23 min  Charges:  $Therapeutic Exercise: 8-22 mins $Therapeutic Activity: 8-22 mins                    G Codes:      Michail Jewels PT, DPT [phone] Pager: [phone] 06/12/2014, 1:47 PM

## 2014-06-12 NOTE — Progress Notes (Signed)
PROGRESS NOTE  Chad [name] PFX:[id number] DOB: 1955/03/23 DOA: 06/08/2014 PCP: Ulla Potash., MD  HPI/Recap of past 24 hours:  Returned back from HD , c/o left leg pain, but reported overall feeling better.  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 Active Problems:   Thrombocytopenia   Acute respiratory failure   Renal transplant disorder   Cardiomyopathy   Atrial flutter   Ulcer of left lower leg   Chronic systolic CHF (congestive heart failure)   CKD (chronic kidney disease)   Hypertension   History of renal transplant   Osteomyelitis   Hepatitis C   Hyperparathyroidism due to renal insufficiency   Shock   Arterial hypotension   ESRD (end stage renal disease)   ESRD on dialysis   Acute blood loss anemia  Hypotension -Multifactorial including sepsis, post anesthetic effect, and possible adrenal insufficiency in the setting of 3 antihypertensive medications -Has been weaned off of vasopressors -Blood pressure remains soft but stable, decrease coreg to 6.25, hold imdur for now. -Has been weaned back to baseline steroids -Hold  Home meds amlodipine and hydralazine for now  Osteomyelitis left femur -Status post I&D 06/08/2014, Dr. Marcelino Scot -Wound care per orthopedics -Antibiotics per infectious disease -Blood cultures remain negative -Patient is afebrile and hemodynamically stable albeit blood pressures remained soft -Physical therapy evaluation when orthopedics feels the patient is stable -Patient ultimately plans to go back home after this hospitalization--he does not want to go to skilled nursing facility -ESR 38, CRP 5.0  Chronic systolic CHF/nonischemic cardiomyopathy -Previously had EF as low as 15-20% -Follows Dr. Haroldine Laws -Most recent echocardiogram 06/13/2013 showed EF 60% -As discussed above, resume lower dose carvedilol and resume Imdur once blood pressure allows -Appears a little on the  hypervolemic side, but this is managed with dialysis patient states that he has had Unna boots on his bilateral lower extremities for worsening leg edema for the past month which was attributable to the patient's worsening renal function  -He is not in any respiratory distress--his mild hypoxemia likely due to atelectasis --oxygen saturation 98-99 percent on 2 L  -Wean oxygen for oxygen saturation greater than 92%  -Encourage incentive spirometry  Thrombocytopenia -This has been chronic dating back to November 2013 -likely due to chronic hep C with 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 -largely stable since admission -Intermittent worsening likely due to the patient's infectious process with associated myelosuppression -Monitor for bleeding complications -check H29 with am labs -HIT panel if continued drop -plt better, initial drop possible due to infection  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 -Dialysis per nephrology -Patient has only resumed hemodialysis within the past month  -AV fistula creation [id number]  -Metabolic bone disease per nephrology -Dialysis Monday, Wednesday, Friday -Continue chronic home dose of prednisone 5 mg daily  Paroxysmal atrial flutter  -Presently in sinus rhythm  -Not on anticoagulation secondary to GI bleed  -Continue aspirin  -Continue carvedilol  -Continue amiodarone  -FOBT  Anemia of CKD -Patient's baseline hemoglobin has had wide variations but baseline seems to be around 11-12 -Drop in hemoglobin likely dilutional and surgery -Aranesp per nephrology -Repeat CBC in a.m.--transfuse for hemoglobin less than 7   Hypertension -on carvedilol only at this time as discussed above  -Resume other antihypertensive medication as blood pressure allows   Hyperlipidemia -Continue pravastatin   Code Status: full  Family Communication: patient  Disposition Plan: per primary team    Procedures:  HD  I/d left thigh  5/12  Antibiotics:  rocephin   Objective: BP 105/61 mmHg  Pulse 77  Temp(Src) 98.7 F (37.1 C) (Oral)  Resp 18  Ht 6' (1.829 m)  Wt 109 kg (240 lb 4.8 oz)  BMI 32.58 kg/m2  SpO2 95%  Intake/Output Summary (Last 24 hours) at 06/12/14 1919 Last data filed at 06/12/14 1817  Gross per 24 hour  Intake    560 ml  Output   2695 ml  Net  -2135 ml   Filed Weights   06/08/14 0913 06/12/14 1336  Weight: 106.595 kg (235 lb) 109 kg (240 lb 4.8 oz)    Exam:   General:  NAD  Cardiovascular: RRR  Respiratory: CTABL  Abdomen: Soft/ND/NT, positive BS  Musculoskeletal: post op changes left thigh  Neuro: aaox3  Data Reviewed: Basic Metabolic Panel:  Recent Labs Lab 06/08/14 0948 06/08/14 1908 06/09/14 0429 06/10/14 0428 06/12/14 0500  NA 137 134* 130* 131* 132*  K 3.4* 3.6 3.6 4.2 3.6  CL 97* 96* 94* 96* 96*  CO2 $Re'27 24 24 25 24  'NjM$ GLUCOSE 77 107* 140* 126* 87  BUN 21* 22* 26* 38* 32*  CREATININE 3.42* 3.78* 4.07* 4.89* 4.31*  CALCIUM 9.0 8.5* 8.3* 8.1* 8.2*  MG  --   --  1.7  --   --   PHOS  --  5.3* 5.3* 6.1* 3.7   Liver Function Tests:  Recent Labs Lab 06/08/14 0948 06/08/14 1908 06/10/14 0428 06/12/14 0500  AST 19  --   --   --   ALT 7*  --   --   --   ALKPHOS 65  --   --   --   BILITOT 0.5  --   --   --   PROT 8.0  --   --   --   ALBUMIN 2.4* 2.9* 2.2* 2.2*   No results for input(s): LIPASE, AMYLASE in the last 168 hours. No results for input(s): AMMONIA in the last 168 hours. CBC:  Recent Labs Lab 06/08/14 0948 06/08/14 1700 06/08/14 2140 06/09/14 0429 06/10/14 0428 06/12/14 0500  WBC 11.9*  --  16.6* 18.3* 16.8* 14.3*  NEUTROABS 7.2  --   --   --   --   --   HGB 13.9 10.2* 9.8* 8.5* 7.6* 8.4*  HCT 42.5 33.0* 31.6* 26.9* 23.7* 25.9*  MCV 92.0  --  92.7 91.8 90.1 91.8  PLT 140*  --  156 161 130* 173   Cardiac Enzymes:   No results for input(s): CKTOTAL, CKMB, CKMBINDEX, TROPONINI in the last 168 hours. BNP (last 3 results) No  results for input(s): BNP in the last 8760 hours.  ProBNP (last 3 results) No results for input(s): PROBNP in the last 8760 hours.  CBG:  Recent Labs Lab 06/10/14 2151 06/11/14 0630 06/11/14 1157 06/11/14 1625 06/11/14 2207  GLUCAP 115* 88 77 91 107*    Recent Results (from the past 240 hour(s))  Anaerobic culture     Status: None (Preliminary result)   Collection Time: 06/08/14 11:55 AM  Result Value Ref Range Status   Specimen Description TISSUE THIGH LEFT  Final   Special Requests NONE  Final   Gram Stain   Final    ABUNDANT WBC PRESENT,BOTH PMN AND MONONUCLEAR ABUNDANT GRAM POSITIVE COCCI IN CLUSTERS IN PAIRS Performed at [facility] Gram Stain Report Called to,Read Back By and Verified With: Gram Stain Report Called to,Read Back By and Verified With: Ellison Carwin RN 1320 06/08/14 [place] WILSONM Performed at [facility]    Culture   Final    NO ANAEROBES ISOLATED; CULTURE IN PROGRESS FOR 5 DAYS Performed at [facility]    Report Status PENDING  Incomplete  Gram stain     Status: None   Collection Time: 06/08/14 11:55 AM  Result Value Ref Range Status   Specimen Description TISSUE THIGH LEFT  Final   Special Requests NONE  Final   Gram Stain   Final    ABUNDANT WBC PRESENT,BOTH PMN AND MONONUCLEAR ABUNDANT GRAM POSITIVE COCCI IN PAIRS IN CLUSTERS Gram Stain Report Called to,Read Back By and Verified With: Ellison Carwin RN 13:20 06/08/14 ([email or web address])    Report Status 06/08/2014 FINAL  Final  Tissue culture     Status: None   Collection Time: 06/08/14 11:55 AM  Result Value Ref Range Status   Specimen Description TISSUE THIGH LEFT  Final   Special Requests NONE  Final   Gram Stain   Final    ABUNDANT WBC PRESENT,BOTH PMN AND MONONUCLEAR ABUNDANT GRAM POSITIVE COCCI IN CLUSTERS IN PAIRS Performed at [facility] Gram Stain Report Called to,Read Back By and Verified With: Gram Stain Report Called to,Read Back By and Verified With: Ellison Carwin RF  1638 06/08/14 [place] WILSONM Performed at [facility]    Culture   Final    MODERATE PROTEUS MIRABILIS Performed at [facility]    Report Status 06/11/2014 FINAL  Final   Organism ID, Bacteria PROTEUS MIRABILIS  Final      Susceptibility   Proteus mirabilis - MIC*    AMPICILLIN >=32 RESISTANT Resistant     AMPICILLIN/SULBACTAM >=32 RESISTANT Resistant     CEFAZOLIN >=64 RESISTANT Resistant     CEFEPIME <=1 SENSITIVE Sensitive     CEFTAZIDIME 4 SENSITIVE Sensitive     CEFTRIAXONE 4 SENSITIVE Sensitive     CIPROFLOXACIN <=0.25 SENSITIVE Sensitive     GENTAMICIN <=1 SENSITIVE Sensitive     IMIPENEM 2 SENSITIVE Sensitive     PIP/TAZO <=4 SENSITIVE Sensitive     TOBRAMYCIN <=1 SENSITIVE Sensitive     TRIMETH/SULFA <=20 SENSITIVE Sensitive     * MODERATE PROTEUS MIRABILIS  Anaerobic culture     Status: None (Preliminary result)   Collection Time: 06/08/14  1:58 PM  Result Value Ref Range Status   Specimen Description TISSUE  Final   Special Requests REAMINGS OF LEFT FEMUR PT ON ANCEF AND ZOSYN  Final   Gram Stain   Final    ABUNDANT WBC PRESENT,BOTH PMN AND MONONUCLEAR NO ORGANISMS SEEN Performed at [facility] Performed at [facility]    Culture   Final    NO ANAEROBES ISOLATED; CULTURE IN PROGRESS FOR 5 DAYS Performed at [facility]    Report Status PENDING  Incomplete  Gram stain     Status: None   Collection Time: 06/08/14  1:58 PM  Result Value Ref Range Status   Specimen Description TISSUE  Final   Special Requests REAMINGS OF LEFT FEMUR  Final   Gram Stain   Final    ABUNDANT WBC PRESENT,BOTH PMN AND MONONUCLEAR NO ORGANISMS SEEN    Report Status 06/08/2014 FINAL  Final  Tissue culture     Status: None   Collection Time: 06/08/14  1:58 PM  Result Value Ref Range Status   Specimen Description TISSUE  Final   Special Requests REAMINGS OF LEFT FEMUR  Final   Gram Stain   Final    ABUNDANT WBC  PRESENT,BOTH PMN AND  MONONUCLEAR NO ORGANISMS SEEN Performed at [facility] Performed at [facility]    Culture   Final    NO GROWTH 3 DAYS Performed at [facility]    Report Status 06/11/2014 FINAL  Final  MRSA PCR Screening     Status: Abnormal   Collection Time: 06/08/14  6:15 PM  Result Value Ref Range Status   MRSA by PCR POSITIVE (A) NEGATIVE Final    Comment:        The GeneXpert MRSA Assay (FDA approved for NASAL specimens only), is one component of a comprehensive MRSA colonization surveillance program. It is not intended to diagnose MRSA infection nor to guide or monitor treatment for MRSA infections. RESULT CALLED TO, READ BACK BY AND VERIFIED WITH: RN,GREG HARDUK [id number] $RemoveBefor'@2121'[id number]$  THANEY   Culture, blood (routine x 2)     Status: None (Preliminary result)   Collection Time: 06/08/14  7:08 PM  Result Value Ref Range Status   Specimen Description BLOOD RIGHT ANTECUBITAL  Final   Special Requests   Final    BOTTLES DRAWN AEROBIC AND ANAEROBIC 5CCBLUE 3CC RED   Culture   Final           BLOOD CULTURE RECEIVED NO GROWTH TO DATE CULTURE WILL BE HELD FOR 5 DAYS BEFORE ISSUING A FINAL NEGATIVE REPORT Note: Culture results may be compromised due to an inadequate volume of blood received in culture bottles. Performed at [facility]    Report Status PENDING  Incomplete  Culture, blood (routine x 2)     Status: None (Preliminary result)   Collection Time: 06/08/14  7:20 PM  Result Value Ref Range Status   Specimen Description BLOOD RIGHT HAND  Final   Special Requests BOTTLES DRAWN AEROBIC ONLY 5ML  Final   Culture   Final           BLOOD CULTURE RECEIVED NO GROWTH TO DATE CULTURE WILL BE HELD FOR 5 DAYS BEFORE ISSUING A FINAL NEGATIVE REPORT Performed at [facility]    Report Status PENDING  Incomplete  Urine culture     Status: None   Collection Time: 06/09/14  7:03 AM  Result Value Ref Range Status   Specimen Description URINE, RANDOM  Final    Special Requests NONE  Final   Colony Count NO GROWTH Performed at [facility]   Final   Culture NO GROWTH Performed at [facility]   Final   Report Status 06/10/2014 FINAL  Final     Studies: No results found.  Scheduled Meds: . allopurinol  100 mg Oral BID  . amiodarone  100 mg Oral Daily  . aspirin EC  325 mg Oral Daily  . calcium acetate  667 mg Oral TID WC  . carvedilol  12.5 mg Oral BID WC  . cefTRIAXone (ROCEPHIN)  IV  2 g Intravenous Q24H  . Chlorhexidine Gluconate Cloth  6 each Topical Q0600  . darbepoetin (ARANESP) injection - DIALYSIS  100 mcg Intravenous Q Mon-HD  . docusate sodium  100 mg Oral BID  . doxercalciferol  1 mcg Intravenous Q M,W,F-HD  . heparin subcutaneous  5,000 Units Subcutaneous 3 times per day  . isosorbide mononitrate  60 mg Oral Daily  . multivitamin  1 tablet Oral QHS  . mupirocin ointment  1 application Nasal BID  . pravastatin  40 mg Oral q1800  . predniSONE  5 mg Oral Q breakfast    Continuous Infusions: .  sodium chloride 10 mL/hr at 06/08/14 1035     Time spent: 40mins  Shakim Faith MD, PhD  [name] Pager [phone]. If 7PM-7AM, please contact night-coverage at [email or web address], password [facility] 06/12/2014, 7:19 PM  LOS: 4 days

## 2014-06-12 NOTE — Progress Notes (Signed)
Subjective:   Feeling great, felt weak getting up yesterday.  Objective Filed Vitals:   06/10/14 2013 06/11/14 0605 06/11/14 1623 06/11/14 2037  BP:  103/46 106/61 105/55  Pulse:  70 69 67  Temp:  97.9 F (36.6 C) 98.3 F (36.8 C) 98.1 F (36.7 C)  TempSrc:  Oral Oral Oral  Resp:  Height:      Weight:      SpO2: 97% 98% 100% 99%   Physical Exam General: alert and oriented, no acute distress.  Heart: RRR  Lungs: CTA, unlabored.  Abdomen: soft, nontender +BS  Extremities: bilat legs wrapped. No obvious edema  Dialysis Access:  R IJ cath. L AVF +b/t   Dialysis Orders: [facility] 4hr 30 mins 103.5kgs 2K/2.5ca 5000u heparin Hectorol 1   Assessment/Plan: 1. L distal femur osteomyelitis - new resorbable beads placed, wound culture + for Proteus, now only on ceftrianxone per ID. 2. Septic shock - resolved, off pressors, moved from ICU 5/14. 3. ESRD - HD on MWF @ [facility], HD today.  4. Hypotension/Volume - BP 105/55, Carvedilol 12.5 mg bid; wt 106.6 kg, no excess fluid. 5. Anemia - Hgb down to 7.6, no Aranesp. Start Aranesp 100 mcg 5/16, check CBC with HD today 6. Sec HPT - Ca 8.1 (9.5 corrected), P 6.1; Hectorol 1 mcg, Phoslo 1 with meals. 7. Nutrition - Alb 2.2, renal diet, vitamin.  Jetty Duhamel, NP [facility] [phone] 06/12/2014,9:34 AM  LOS: 4 days   Pt seen, examined and agree w A/P as above.  Vinson Moselle MD pager [phone]    cell [phone] 06/12/2014, 1:22 PM     Additional Objective Labs: Basic Metabolic Panel:  Recent Labs Lab 06/08/14 1908 06/09/14 0429 06/10/14 0428  NA 134* 130* 131*  K 3.6 3.6 4.2  CL 96* 94* 96*  CO2 GLUCOSE 107* 140* 126*  BUN 22* 26* 38*  CREATININE 3.78* 4.07* 4.89*  CALCIUM 8.5* 8.3* 8.1*  PHOS 5.3* 5.3* 6.1*   Liver Function Tests:  Recent Labs Lab 06/08/14 0948 06/08/14 1908 06/10/14 0428  AST 19  --   --   ALT 7*  --   --   ALKPHOS 65  --   --    BILITOT 0.5  --   --   PROT 8.0  --   --   ALBUMIN 2.4* 2.9* 2.2*   No results for input(s): LIPASE, AMYLASE in the last 168 hours. CBC:  Recent Labs Lab 06/08/14 0948  06/08/14 2140 06/09/14 0429 06/10/14 0428  WBC 11.9*  --  16.6* 18.3* 16.8*  NEUTROABS 7.2  --   --   --   --   HGB 13.9  < > 9.8* 8.5* 7.6*  HCT 42.5  < > 31.6* 26.9* 23.7*  MCV 92.0  --  92.7 91.8 90.1  PLT 140*  --  156 161 130*  < > = values in this interval not displayed. Blood Culture    Component Value Date/Time   SDES URINE, RANDOM 06/09/2014 0703   SPECREQUEST NONE 06/09/2014 0703   CULT NO GROWTH Performed at [facility]  06/09/2014 0703   REPTSTATUS 06/10/2014 FINAL 06/09/2014 0703    Cardiac Enzymes: No results for input(s): CKTOTAL, CKMB, CKMBINDEX, TROPONINI in the last 168 hours. CBG:  Recent Labs Lab 06/10/14 2151 06/11/14 0630 06/11/14 1157 06/11/14 1625 06/11/14 2207  GLUCAP 115* 88 77 91 107*   Iron Studies: No results for input(s): IRON,  TIBC, TRANSFERRIN, FERRITIN in the last 72 hours. @lablastinr3 @ Studies/Results: No results found. Medications: . sodium chloride 10 mL/hr at 06/08/14 1035   . allopurinol  100 mg Oral BID  . amiodarone  100 mg Oral Daily  . aspirin EC  325 mg Oral Daily  . calcium acetate  667 mg Oral TID WC  . carvedilol  12.5 mg Oral BID WC  . cefTRIAXone (ROCEPHIN)  IV  2 g Intravenous Q24H  . Chlorhexidine Gluconate Cloth  6 each Topical Q0600  . darbepoetin (ARANESP) injection - DIALYSIS  100 mcg Intravenous Q Mon-HD  . docusate sodium  100 mg Oral BID  . doxercalciferol  1 mcg Intravenous Q M,W,F-HD  . heparin subcutaneous  5,000 Units Subcutaneous 3 times per day  . isosorbide mononitrate  60 mg Oral Daily  . multivitamin  1 tablet Oral QHS  . mupirocin ointment  1 application Nasal BID  . pravastatin  40 mg Oral q1800  . predniSONE  5 mg Oral Q breakfast

## 2014-06-12 NOTE — Progress Notes (Signed)
INFECTIOUS DISEASE PROGRESS NOTE  ID: Chad [name] is a 59 y.o. 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 Active Problems:   Thrombocytopenia   Acute respiratory failure   Renal transplant disorder   Cardiomyopathy   Atrial flutter   Ulcer of left lower leg   Chronic systolic CHF (congestive heart failure)   CKD (chronic kidney disease)   Hypertension   History of renal transplant   Osteomyelitis   Hepatitis C   Hyperparathyroidism due to renal insufficiency   Shock   Arterial hypotension   ESRD (end stage renal disease)   ESRD on dialysis   Acute blood loss anemia  Subjective: Without complaints  Abtx:  Anti-infectives    Start     Dose/Rate Route Frequency Ordered Stop   06/12/14 1200  vancomycin (VANCOCIN) IVPB 1000 mg/200 mL premix  Status:  Discontinued     1,000 mg 200 mL/hr over 60 Minutes Intravenous Every M-W-F (Hemodialysis) 06/09/14 1334 06/10/14 1235   06/12/14 1200  meropenem (MERREM) 500 mg in sodium chloride 0.9 % 50 mL IVPB  Status:  Discontinued     500 mg 100 mL/hr over 30 Minutes Intravenous Every M-W-F (Hemodialysis) 06/09/14 1334 06/09/14 1501   06/11/14 1200  cefTRIAXone (ROCEPHIN) 2 g in dextrose 5 % 50 mL IVPB - Premix     2 g 100 mL/hr over 30 Minutes Intravenous Every 24 hours 06/11/14 1100     06/10/14 1200  meropenem (MERREM) 500 mg in sodium chloride 0.9 % 50 mL IVPB  Status:  Discontinued     500 mg 100 mL/hr over 30 Minutes Intravenous Every Sat (Hemodialysis) 06/09/14 1334 06/09/14 1501   06/10/14 1200  vancomycin (VANCOCIN) IVPB 1000 mg/200 mL premix     1,000 mg 200 mL/hr over 60 Minutes Intravenous Every Sat (Hemodialysis) 06/09/14 1334 06/10/14 1142   06/09/14 1800  meropenem (MERREM) 500 mg in sodium chloride 0.9 % 50 mL IVPB  Status:  Discontinued     500 mg 100 mL/hr over 30 Minutes Intravenous Daily after supper 06/09/14 1501 06/11/14 1100   06/08/14 1900   piperacillin-tazobactam (ZOSYN) IVPB 2.25 g  Status:  Discontinued     2.25 g 100 mL/hr over 30 Minutes Intravenous 3 times per day 06/08/14 1134 06/08/14 1812   06/08/14 1830  meropenem (MERREM) 500 mg in sodium chloride 0.9 % 50 mL IVPB  Status:  Discontinued     500 mg 100 mL/hr over 30 Minutes Intravenous Every M-W-F (Hemodialysis) 06/08/14 1824 06/09/14 1316   06/08/14 1800  vancomycin (VANCOCIN) IVPB 1000 mg/200 mL premix  Status:  Discontinued     1,000 mg 200 mL/hr over 60 Minutes Intravenous Every M-W-F (Hemodialysis) 06/08/14 1749 06/09/14 1316   06/08/14 1232  tobramycin (NEBCIN) injection  Status:  Discontinued       As needed 06/08/14 1234 06/08/14 1501   06/08/14 1231  vancomycin (VANCOCIN) powder  Status:  Discontinued       As needed 06/08/14 1232 06/08/14 1501   06/08/14 1100  piperacillin-tazobactam (ZOSYN) IVPB 3.375 g  Status:  Discontinued     3.375 g 100 mL/hr over 30 Minutes Intravenous  Once 06/08/14 1046 06/08/14 1055   06/08/14 1100  piperacillin-tazobactam (ZOSYN) IVPB 3.375 g     3.375 g 100 mL/hr over 30 Minutes Intravenous On call to O.R. 06/08/14 1055 06/08/14 1335   06/08/14 0930  vancomycin (VANCOCIN) IVPB 1000 mg/200 mL premix  1,000 mg 200 mL/hr over 60 Minutes Intravenous On call to O.R. 06/08/14 0929 06/08/14 1105      Medications:  Scheduled: . allopurinol  100 mg Oral BID  . amiodarone  100 mg Oral Daily  . aspirin EC  325 mg Oral Daily  . calcium acetate  667 mg Oral TID WC  . carvedilol  12.5 mg Oral BID WC  . cefTRIAXone (ROCEPHIN)  IV  2 g Intravenous Q24H  . Chlorhexidine Gluconate Cloth  6 each Topical Q0600  . darbepoetin (ARANESP) injection - DIALYSIS  100 mcg Intravenous Q Mon-HD  . docusate sodium  100 mg Oral BID  . doxercalciferol  1 mcg Intravenous Q M,W,F-HD  . heparin subcutaneous  5,000 Units Subcutaneous 3 times per day  . isosorbide mononitrate  60 mg Oral Daily  . multivitamin  1 tablet Oral QHS  . mupirocin ointment   1 application Nasal BID  . pravastatin  40 mg Oral q1800  . predniSONE  5 mg Oral Q breakfast    Objective: Vital signs in last 24 hours: Temp:  [98 F (36.7 C)-98.3 F (36.8 C)] 98 F (36.7 C) (05/16 1336) Pulse Rate:  [67-79] 79 (05/16 1400) Resp:  [18] 18 (05/15 2037) BP: (98-121)/(55-72) 105/68 mmHg (05/16 1400) SpO2:  [99 %-100 %] 99 % (05/15 2037) Weight:  [109 kg (240 lb 4.8 oz)] 109 kg (240 lb 4.8 oz) (05/16 1336)   General appearance: alert, cooperative and no distress Neck: R neck ij clean Resp: clear to auscultation bilaterally Cardio: regular rate and rhythm GI: normal findings: bowel sounds normal and soft, non-tender  + bruit LUE  Lab Results  Recent Labs  06/10/14 0428 06/12/14 0500  WBC 16.8* 14.3*  HGB 7.6* 8.4*  HCT 23.7* 25.9*  NA 131* 132*  K 4.2 3.6  CL 96* 96*  CO2 25 24  BUN 38* 32*  CREATININE 4.89* 4.31*   Liver Panel  Recent Labs  06/10/14 0428 06/12/14 0500  ALBUMIN 2.2* 2.2*   Sedimentation Rate No results for input(s): ESRSEDRATE in the last 72 hours. C-Reactive Protein No results for input(s): CRP in the last 72 hours.  Microbiology: Recent Results (from the past 240 hour(s))  Anaerobic culture     Status: None (Preliminary result)   Collection Time: 06/08/14 11:55 AM  Result Value Ref Range Status   Specimen Description TISSUE THIGH LEFT  Final   Special Requests NONE  Final   Gram Stain   Final    ABUNDANT WBC PRESENT,BOTH PMN AND MONONUCLEAR ABUNDANT GRAM POSITIVE COCCI IN CLUSTERS IN PAIRS Performed at [facility] Gram Stain Report Called to,Read Back By and Verified With: Gram Stain Report Called to,Read Back By and Verified With: Mendel Corning RN 1320 06/08/14 [place] WILSONM Performed at [facility]   Final    NO ANAEROBES ISOLATED; CULTURE IN PROGRESS FOR 5 DAYS Performed at [facility]    Report Status PENDING  Incomplete  Gram stain     Status: None   Collection Time: 06/08/14  11:55 AM  Result Value Ref Range Status   Specimen Description TISSUE THIGH LEFT  Final   Special Requests NONE  Final   Gram Stain   Final    ABUNDANT WBC PRESENT,BOTH PMN AND MONONUCLEAR ABUNDANT GRAM POSITIVE COCCI IN PAIRS IN CLUSTERS Gram Stain Report Called to,Read Back By and Verified With: Mendel Corning RN 13:20 06/08/14 ([email or web address])    Report Status 06/08/2014 FINAL  Final  Tissue culture     Status: None   Collection Time: 06/08/14 11:55 AM  Result Value Ref Range Status   Specimen Description TISSUE THIGH LEFT  Final   Special Requests NONE  Final   Gram Stain   Final    ABUNDANT WBC PRESENT,BOTH PMN AND MONONUCLEAR ABUNDANT GRAM POSITIVE COCCI IN CLUSTERS IN PAIRS Performed at [facility] Gram Stain Report Called to,Read Back By and Verified With: Gram Stain Report Called to,Read Back By and Verified With: Mendel Corning RN 1320 06/08/14 [place] WILSONM Performed at [facility]    Culture   Final    MODERATE PROTEUS MIRABILIS Performed at [facility]    Report Status 06/11/2014 FINAL  Final   Organism ID, Bacteria PROTEUS MIRABILIS  Final      Susceptibility   Proteus mirabilis - MIC*    AMPICILLIN >=32 RESISTANT Resistant     AMPICILLIN/SULBACTAM >=32 RESISTANT Resistant     CEFAZOLIN >=64 RESISTANT Resistant     CEFEPIME <=1 SENSITIVE Sensitive     CEFTAZIDIME 4 SENSITIVE Sensitive     CEFTRIAXONE 4 SENSITIVE Sensitive     CIPROFLOXACIN <=0.25 SENSITIVE Sensitive     GENTAMICIN <=1 SENSITIVE Sensitive     IMIPENEM 2 SENSITIVE Sensitive     PIP/TAZO <=4 SENSITIVE Sensitive     TOBRAMYCIN <=1 SENSITIVE Sensitive     TRIMETH/SULFA <=20 SENSITIVE Sensitive     * MODERATE PROTEUS MIRABILIS  Anaerobic culture     Status: None (Preliminary result)   Collection Time: 06/08/14  1:58 PM  Result Value Ref Range Status   Specimen Description TISSUE  Final   Special Requests REAMINGS OF LEFT FEMUR PT ON ANCEF AND ZOSYN  Final   Gram Stain   Final    ABUNDANT  WBC PRESENT,BOTH PMN AND MONONUCLEAR NO ORGANISMS SEEN Performed at [facility] Performed at [facility]    Culture   Final    NO ANAEROBES ISOLATED; CULTURE IN PROGRESS FOR 5 DAYS Performed at [facility]    Report Status PENDING  Incomplete  Gram stain     Status: None   Collection Time: 06/08/14  1:58 PM  Result Value Ref Range Status   Specimen Description TISSUE  Final   Special Requests REAMINGS OF LEFT FEMUR  Final   Gram Stain   Final    ABUNDANT WBC PRESENT,BOTH PMN AND MONONUCLEAR NO ORGANISMS SEEN    Report Status 06/08/2014 FINAL  Final  Tissue culture     Status: None   Collection Time: 06/08/14  1:58 PM  Result Value Ref Range Status   Specimen Description TISSUE  Final   Special Requests REAMINGS OF LEFT FEMUR  Final   Gram Stain   Final    ABUNDANT WBC PRESENT,BOTH PMN AND MONONUCLEAR NO ORGANISMS SEEN Performed at [facility] Performed at [facility]    Culture   Final    NO GROWTH 3 DAYS Performed at [facility]    Report Status 06/11/2014 FINAL  Final  MRSA PCR Screening     Status: Abnormal   Collection Time: 06/08/14  6:15 PM  Result Value Ref Range Status   MRSA by PCR POSITIVE (A) NEGATIVE Final    Comment:        The GeneXpert MRSA Assay (FDA approved for NASAL specimens only), is one component of a comprehensive MRSA colonization surveillance program. It is not intended to diagnose MRSA infection nor to guide or monitor  treatment for MRSA infections. RESULT CALLED TO, READ BACK BY AND VERIFIED WITH: RN,GREG HARDUK [id number]  THANEY   Culture, blood (routine x 2)     Status: None (Preliminary result)   Collection Time: 06/08/14  7:08 PM  Result Value Ref Range Status   Specimen Description BLOOD RIGHT ANTECUBITAL  Final   Special Requests   Final    BOTTLES DRAWN AEROBIC AND ANAEROBIC 5CCBLUE 3CC RED   Culture   Final           BLOOD CULTURE RECEIVED NO GROWTH TO DATE CULTURE WILL  BE HELD FOR 5 DAYS BEFORE ISSUING A FINAL NEGATIVE REPORT Note: Culture results may be compromised due to an inadequate volume of blood received in culture bottles. Performed at [facility]    Report Status PENDING  Incomplete  Culture, blood (routine x 2)     Status: None (Preliminary result)   Collection Time: 06/08/14  7:20 PM  Result Value Ref Range Status   Specimen Description BLOOD RIGHT HAND  Final   Special Requests BOTTLES DRAWN AEROBIC ONLY  Final   Culture   Final           BLOOD CULTURE RECEIVED NO GROWTH TO DATE CULTURE WILL BE HELD FOR 5 DAYS BEFORE ISSUING A FINAL NEGATIVE REPORT Performed at [facility]    Report Status PENDING  Incomplete  Urine culture     Status: None   Collection Time: 06/09/14  7:03 AM  Result Value Ref Range Status   Specimen Description URINE, RANDOM  Final   Special Requests NONE  Final   Colony Count NO GROWTH Performed at [facility]   Final   Culture NO GROWTH Performed at [facility]   Final   Report Status 06/10/2014 FINAL  Final    Studies/Results: No results found.   Assessment/Plan: Shock/sepsis Osteomyelitis L femur Wound infection  Debrided 5-12 ? 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  Aim for 6 weeks of anbx Has seen Dr Orvan Falconer in ID clinic previously         Johny Sax Infectious Diseases (pager) [phone] [email or web address] 06/12/2014, 2:48 PM  LOS: 4 days

## 2014-06-12 NOTE — Progress Notes (Signed)
[facility]edic [facility]uma [facility] Progress Note  Subjective  Doing well Pain well controlled  Feels better To dialysis today Unna boots changed out over weekend  1 drain came out over weekend with therapy   Pt on ceftriaxone Cultures grew out proteus mirabilis   Will follow up on office cultures as well as they did have a GNR but no such organism on intra-op cx  Review of Systems  Constitutional: Negative for chills.  Respiratory: Negative for shortness of breath and wheezing.   Cardiovascular: Negative for chest pain and palpitations.  Gastrointestinal: Negative for nausea, vomiting and abdominal pain.  Neurological: Negative for headaches.     Objective   BP 105/55 mmHg  Pulse 67  Temp(Src) 98.1 F (36.7 C) (Oral)  Resp 18  Ht 6' (1.829 m)  Wt 106.595 kg (235 lb)  BMI 31.86 kg/m2  SpO2 99%  Intake/Output      05/15 0701 - 05/16 0700 05/16 0701 - 05/17 0700   P.O. 680    I.V. (mL/kg)     Other 0    Total Intake(mL/kg) 680 (6.4)    Urine (mL/kg/hr) 120 (0)    Drains 0 (0)    Other     Total Output 120     Net +560            Labs  No new labs this am CBC to be drawn in dialysis   Exam  Gen: resting comfortably in bed, about to transfer to [facility] Abd: soft NTND, +BS Ext:       Left Lower Extremity   New unna boot to Both legs  Dressing stable  dc'd remaining drain   L leg stable    Assessment and Plan   POD/HD#: 72   59 y/o male s/p I&D for persistent L distal femur osteo  1. L distal femur osteomyelitis and nonunion             New resorbable abx beads placed             No hardware implanted             Nonunion present             NWB L leg  abx per ID   2. Shock             Likely multifactorial             resolved   Continue per IM  3. ESRD on dialysis             Dialysis today              Appreciate Renals assistance   4. ID             culture positive for proteus mirabilis   On Rocephin currently  Plan for dc abx and  duration?             Appreciate ID assistance   5. Medical issues             per hospitalist  Appreciate their assistance with this highly complex pt   7. Acute on chronic anemia             CBC to be checked in HD   8. Dispo             Dialysis today             Continue with current care    Would like to dc  home tomorrow if ok with other services  [facility] consult   Continue renal diet                  Chad Latin, PA-C [facility] [phone] (O) 06/12/2014 8:03 AM

## 2014-06-12 NOTE — Progress Notes (Signed)
  Addendum  Office cultures final results         Mearl Latin, PA-C [facility] [phone] (P) 06/12/2014 9:50 AM

## 2014-06-13 DIAGNOSIS — B1710 Acute hepatitis C without hepatic coma: Secondary | ICD-10-CM

## 2014-06-13 DIAGNOSIS — S72412K Displaced unspecified condyle fracture of lower end of left femur, subsequent encounter for closed fracture with nonunion: Secondary | ICD-10-CM

## 2014-06-13 LAB — ANAEROBIC CULTURE

## 2014-06-13 MED ORDER — CARVEDILOL 3.125 MG PO TABS
3.1250 mg | ORAL_TABLET | Freq: Two times a day (BID) | ORAL | Status: DC
  Administered 2014-06-14 – 2014-06-18 (×9): 3.125 mg via ORAL
  Filled 2014-06-13 (×9): qty 1

## 2014-06-13 NOTE — Consult Note (Signed)
  Patient is a 59 year old gentleman status post initial intramedullary fixation for a left distal femur fracture in June 2014. Patient underwent irrigation and debridement in July 2014. Patient had persistent nonunion and underwent hardware removal in February 2016 with irrigation debridement and repeat irrigation and debridement on May 12.  Review the labs show wound cultures consistent with MRSA. Review the radiographs shows a lucent destructive changes of the distal femur.  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   Plan: Discussed with patient the optimal way to resolve the infection would be an above-the-knee amputation. Patient states he's not interested in such drastic surgery. Discussed option for repeat irrigation and debridement possible reaming of the canal and possible use of an infusion wound VAC system. I will discuss this with Dr. Carola Frost.

## 2014-06-13 NOTE — Progress Notes (Signed)
Subjective:   Feeling great, felt weak getting up yesterday.  Objective Filed Vitals:   06/12/14 1817 06/12/14 1853 06/12/14 2034 06/13/14 0458  BP: 93/65 105/61 86/41 120/68  Pulse: 82 77 76 79  Temp: 99 F (37.2 C) 98.7 F (37.1 C) 98.9 F (37.2 C) 98.9 F (37.2 C)  TempSrc: Oral  Oral Oral  Resp:  18 19 18   Height:      Weight:      SpO2: 98% 95% 94% 95%   Physical Exam General: alert and oriented, no acute distress.  Heart: RRR  Lungs: CTA, unlabored.  Abdomen: soft, nontender +BS  Extremities: bilat legs wrapped. No obvious edema  Dialysis Access:  R IJ cath. L AVF +b/t   Dialysis Orders: [facility]WF [facility] 4hr 30 mins 103.5kgs 2K/2.5ca 5000u heparin Hectorol 1   Assessment: 1. L distal femur osteomyelitis - sp I&D w new resorbable beads placed, wound culture + for Proteus, on ceftrianxone per ID. 2. Septic shock - resolved, off pressors, moved from ICU 5/14. 3. ESRD - HD on MWF  4. Hypotension/Volume - BP 105/55, Carvedilol 12.5 mg bid, up 4-5 kg 5. Anemia - Hgb down to 7.6, started Aranesp 100 mcg 5/16 6. Sec HPT - Ca 8.1 (9.5 corrected), P 6.1; Hectorol 1 mcg, Phoslo 1 with meals. 7. Nutrition - Alb 2.2, renal diet, vitamin.  Plan - HD tomorrow, UF 3-4kg  Vinson Moselle MD pager [phone]    cell [phone] 06/13/2014, 11:59 AM     Additional Objective Labs: Basic Metabolic Panel:  Recent Labs Lab 06/09/14 0429 06/10/14 0428 06/12/14 0500  NA 130* 131* 132*  K 3.6 4.2 3.6  CL 94* 96* 96*  CO2 24 25 24   GLUCOSE 140* 126* 87  BUN 26* 38* 32*  CREATININE 4.07* 4.89* 4.31*  CALCIUM 8.3* 8.1* 8.2*  PHOS 5.3* 6.1* 3.7   Liver Function Tests:  Recent Labs Lab 06/08/14 0948 06/08/14 1908 06/10/14 0428 06/12/14 0500  AST 19  --   --   --   ALT 7*  --   --   --   ALKPHOS 65  --   --   --   BILITOT 0.5  --   --   --   PROT 8.0  --   --   --   ALBUMIN 2.4* 2.9* 2.2* 2.2*   No results for input(s): LIPASE, AMYLASE in the last 168  hours. CBC:  Recent Labs Lab 06/08/14 0948  06/08/14 2140 06/09/14 0429 06/10/14 0428 06/12/14 0500  WBC 11.9*  --  16.6* 18.3* 16.8* 14.3*  NEUTROABS 7.2  --   --   --   --   --   HGB 13.9  < > 9.8* 8.5* 7.6* 8.4*  HCT 42.5  < > 31.6* 26.9* 23.7* 25.9*  MCV 92.0  --  92.7 91.8 90.1 91.8  PLT 140*  --  156 161 130* 173  < > = values in this interval not displayed. Blood Culture    Component Value Date/Time   SDES URINE, RANDOM 06/09/2014 0703   SPECREQUEST NONE 06/09/2014 0703   CULT NO GROWTH Performed at [facility]  06/09/2014 0703   REPTSTATUS 06/10/2014 FINAL 06/09/2014 0703    Cardiac Enzymes: No results for input(s): CKTOTAL, CKMB, CKMBINDEX, TROPONINI in the last 168 hours. CBG:  Recent Labs Lab 06/10/14 2151 06/11/14 0630 06/11/14 1157 06/11/14 1625 06/11/14 2207  GLUCAP 115* 88 77 91 107*   Iron Studies: No results for input(s): IRON, TIBC,  TRANSFERRIN, FERRITIN in the last 72 hours. @ Studies/Results: No results found. Medications: . sodium chloride 10 mL/hr at 06/08/14 1035   . allopurinol  100 mg Oral BID  . amiodarone  100 mg Oral Daily  . aspirin EC  325 mg Oral Daily  . calcium acetate  667 mg Oral TID WC  . carvedilol  6.25 mg Oral BID WC  . cefTRIAXone (ROCEPHIN)  IV  2 g Intravenous Q24H  . Chlorhexidine Gluconate Cloth  6 each Topical Q0600  . darbepoetin (ARANESP) injection - DIALYSIS  100 mcg Intravenous Q Mon-HD  . docusate sodium  100 mg Oral BID  . doxercalciferol  1 mcg Intravenous Q M,W,F-HD  . heparin subcutaneous  5,000 Units Subcutaneous 3 times per day  . multivitamin  1 tablet Oral QHS  . mupirocin ointment  1 application Nasal BID  . pravastatin  40 mg Oral q1800  . predniSONE  5 mg Oral Q breakfast

## 2014-06-13 NOTE — Progress Notes (Signed)
Physical Therapy Treatment Patient Details Name: Chad [name] MRN: [id number] DOB: 1956/01/14 Today's Date: 06/13/2014    History of Present Illness Pt is a 59 y/o male with a PMH of CKD s/p renal transplant, cardiomyopathy, CHF, Hep C. 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 Pt with previous IM nailing, and hardware was removed and replaced with abx beads in 02/2014 to try and address this. Pt is now s/p I&D of his L thigh/distal femur.     PT Comments    Pt demonstrated ability to perform sliding board transfer and manage WC 400 ft this session.  Transfer should be practiced at future session to increase pt's independence w/ adhering to WB status as he required min assist this session.  Pt is progressing well and is anticipating d/c to home w/ HHPT if not accepted to CIR.  Pt is extremely motivated to return to PLOF w/ transfers and is eager to progress w/ therapy.     Follow Up Recommendations  CIR;Supervision for mobility/OOB;Home health PT     Equipment Recommendations  None recommended by PT    Recommendations for Other Services Rehab consult     Precautions / Restrictions Precautions Precautions: Fall Precaution Comments: Unna boot on RLE - pt has wounds there as well Required Braces or Orthoses: Other Brace/Splint Other Brace/Splint: PRAFO LLE Restrictions Weight Bearing Restrictions: Yes LLE Weight Bearing: Non weight bearing    Mobility  Bed Mobility Overal bed mobility: Needs Assistance Bed Mobility: Supine to Sit     Supine to sit: Min assist;HOB elevated     General bed mobility comments: Min assist for managing LLE to sitting EOB.  Mod use of bed rails and incraesed time  Transfers Overall transfer level: Needs assistance Equipment used: Pushed w/c (sliding board) Transfers: Lateral/Scoot Transfers          Lateral/Scoot Transfers: Min assist;With slide board;+2 safety/equipment General transfer comment: Min assist for  placement of sliding board prior to transfer and management of LLE to prevent WB during transfer.  Ambulation/Gait                 Administrator mobility: Yes Wheelchair propulsion: Both upper extremities;Right lower extremity Wheelchair parts: Supervision/cueing Distance: 400 Wheelchair Assistance Details (indicate cue type and reason): Supervision for safety.  L elevating leg rest  Modified Rankin (Stroke Patients Only)       Balance Overall balance assessment: Needs assistance Sitting-balance support: No upper extremity supported;Feet supported Sitting balance-Leahy Scale: Fair                              Cognition Arousal/Alertness: Awake/alert Behavior During Therapy: WFL for tasks assessed/performed Overall Cognitive Status: Within Functional Limits for tasks assessed                      Exercises Total Joint Exercises Ankle Circles/Pumps: AROM;Right;10 reps;Supine Straight Leg Raises: AROM;AAROM;Right;10 reps;Supine    General Comments        Pertinent Vitals/Pain Pain Assessment: 0-10 Pain Score: 4  Pain Location: LLE knee down to L foot Pain Descriptors / Indicators: Aching;Discomfort;Dull Pain Intervention(s): Limited activity within patient's tolerance;Monitored during session;Repositioned    Home Living                      Prior Function  PT Goals (current goals can now be found in the care plan section) Acute Rehab PT Goals Patient Stated Goal: get back to where I was before Progress towards PT goals: Progressing toward goals    Frequency  Min 3X/week    PT Plan Current plan remains appropriate    Co-evaluation             End of Session Equipment Utilized During Treatment: Gait belt;Other (comment) (L PRAFO) Activity Tolerance: Patient limited by fatigue;Patient tolerated treatment well Patient left: in chair;with call  bell/phone within reach;with family/visitor present     Time: 3244-0102 PT Time Calculation (min) (ACUTE ONLY): 34 min  Charges:  $Therapeutic Activity: 8-22 mins $Wheel Chair Management: 8-22 mins                    G Codes:      Michail Jewels PT, DPT [phone] Pager: [phone] 06/13/2014, 11:50 AM

## 2014-06-13 NOTE — Progress Notes (Signed)
[facility] Progress Note  Subjective  Doing ok this am Evaluated by Dr. Lajoyce Corners this am as well  Reviewed PT and OT notes Will get CIR consult. Pt states he may not always have 24 hour supervision but could possibly get it   Review of Systems  Constitutional: Negative for fever.  Respiratory: Negative for shortness of breath.   Cardiovascular: Negative for chest pain and palpitations.  Gastrointestinal: Negative for nausea and vomiting.     Objective   BP 120/68 mmHg  Pulse 79  Temp(Src) 98.9 F (37.2 C) (Oral)  Resp 18  Ht 6' (1.829 m)  Wt 109 kg (240 lb 4.8 oz)  BMI 32.58 kg/m2  SpO2 95%  Intake/Output      05/16 0701 - 05/17 0700 05/17 0701 - 05/18 0700   P.O. 360    Other     Total Intake(mL/kg) 360 (3.3)    Urine (mL/kg/hr) 75 (0)    Drains     Other 2500 (1)    Stool 0 (0)    Total Output 2575     Net -2215          Urine Occurrence 1 x    Stool Occurrence 2 x      Labs  No new labs today   Exam Gen: resting comfortably in bed, NAD Abd: soft NTND, +BS Ext:        Left Lower Extremity                            Dressing stable             motor and sensory functions at baseline               L leg stable    Assessment and Plan   POD/HD#: 23   59 y/o male s/p I&D for persistent L distal femur osteo  1. L distal femur osteomyelitis and nonunion             New resorbable abx beads placed             No hardware implanted             Nonunion present             NWB L leg             abx per ID   2. Shock             Likely multifactorial             resolved               Continue per IM  Only on coreg for HTN at this time, amlodipine and hydralazine on hold   3. ESRD on dialysis             Dialysis tomorrow             Appreciate Renals assistance   4. ID             culture positive for proteus mirabilis and abundand GPC (office cx shows MRSA)             on rocephin  ID notes indicate plan to change him to  ceftaz/vanco with HD  Vanc not restarted yet- defer to ID              Appreciate ID assistance   5. Medical issues  per hospitalist             Appreciate their assistance with this highly complex pt   6. Acute on chronic anemia             stable   7. Dispo  CIR consult                         Continue with current care               consider dc home later today if not CIR candidate              HH consult               Continue renal diet                Mearl Latin, PA-C [facility] [phone] (O) 06/13/2014 9:06 AM

## 2014-06-13 NOTE — Progress Notes (Addendum)
PROGRESS NOTE  Chad [name]:[id number] DOB: Sep 30, 1955 DOA: 06/08/2014 PCP: [name], MD  HPI/Recap of past 24 hours:  reported overall feeling better, less pain, walked with physical therapy today.  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 Active Problems:   Thrombocytopenia   Acute respiratory failure   Renal transplant disorder   Cardiomyopathy   Atrial flutter   Ulcer of left lower leg   Chronic systolic CHF (congestive heart failure)   CKD (chronic kidney disease)   Hypertension   History of renal transplant   Osteomyelitis   Hepatitis C   Hyperparathyroidism due to renal insufficiency   Shock   Arterial hypotension   ESRD (end stage renal disease)   ESRD on dialysis   Acute blood loss anemia  Hypotension -Multifactorial including sepsis, post anesthetic effect, and possible adrenal insufficiency in the setting of 3 antihypertensive medications -Has been weaned off of vasopressors, out of ICU since 5/14 -Blood pressure remains soft but stable, decrease coreg to 3.125, Hold  Home meds imdur, amlodipine and hydralazine for now  Osteomyelitis left femur -Status post I&D 06/08/2014, Dr. Carola Frost -Wound care per orthopedics -Antibiotics per infectious disease -wound culture +proteus, Blood cultures remain negative, wbc 14.3 -Patient ultimately plans to go back home after this hospitalization--he does not want to go to skilled nursing facility   Chronic systolic CHF/nonischemic cardiomyopathy -Previously had EF as low as 15-20% -Follows Dr. [name] -Most recent echocardiogram 06/13/2013 showed EF 60%, on low dose coreg only due to low bp,  -volume managed by HD -on room air now.   Thrombocytopenia -This has been chronic dating back to November 2013 -likely due to chronic hep C with 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 -largely stable since admission -Intermittent worsening likely due to the patient's  infectious process with associated myelosuppression -Monitor for bleeding complications -check B12 with am labs -HIT panel if continued drop -plt better, initial drop possible due to infection  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 -Dialysis per nephrology -Patient has only resumed hemodialysis within the past month  -AV fistula creation 06/01/2014  -Metabolic bone disease per nephrology -Dialysis Monday, Wednesday, Friday -Continue chronic home dose of prednisone 5 mg daily  Paroxysmal atrial flutter  -Presently in sinus rhythm  -Not on anticoagulation secondary to GI bleed  -Continue aspirin  -Continue carvedilol  -Continue amiodarone  -FOBT negative this admission.  Anemia of CKD -Patient's baseline hemoglobin has had wide variations but baseline seems to be around 11-12 -Drop in hemoglobin likely dilutional and surgery -Aranesp per nephrology -Repeat CBC in a.m.--transfuse for hemoglobin less than 7   Hypertension -on decreased dose carvedilol only at this time as discussed above  -Resume other antihypertensive medication as blood pressure allows   Hyperlipidemia -Continue pravastatin   Code Status: full  Family Communication: patient   Disposition Plan: per primary team    Procedures:  HD  I/d left thigh 5/12  Antibiotics:  rocephin   Objective: BP 93/57 mmHg  Pulse 80  Temp(Src) 98.9 F (37.2 C) (Oral)  Resp 18  Ht 6' (1.829 m)  Wt 109 kg (240 lb 4.8 oz)  BMI 32.58 kg/m2  SpO2 99%  Intake/Output Summary (Last 24 hours) at 06/13/14 2024 Last data filed at 06/13/14 1700  Gross per 24 hour  Intake    890 ml  Output     75 ml  Net    815 ml   Filed Weights   06/08/14 0913 06/12/14  1336  Weight: 106.595 kg (235 lb) 109 kg (240 lb 4.8 oz)    Exam:   General:  NAD  Cardiovascular: RRR  Respiratory: CTABL  Abdomen: Soft/ND/NT, positive BS  Musculoskeletal: post op changes left thigh  Neuro: aaox3  Data  Reviewed: Basic Metabolic Panel:  Recent Labs Lab 06/08/14 0948 06/08/14 1908 06/09/14 0429 06/10/14 0428 06/12/14 0500  NA 137 134* 130* 131* 132*  K 3.4* 3.6 3.6 4.2 3.6  CL 97* 96* 94* 96* 96*  CO2 27 24 24 25 24   GLUCOSE 77 107* 140* 126* 87  BUN 21* 22* 26* 38* 32*  CREATININE 3.42* 3.78* 4.07* 4.89* 4.31*  CALCIUM 9.0 8.5* 8.3* 8.1* 8.2*  MG  --   --  1.7  --   --   PHOS  --  5.3* 5.3* 6.1* 3.7   Liver Function Tests:  Recent Labs Lab 06/08/14 0948 06/08/14 1908 06/10/14 0428 06/12/14 0500  AST 19  --   --   --   ALT 7*  --   --   --   ALKPHOS 65  --   --   --   BILITOT 0.5  --   --   --   PROT 8.0  --   --   --   ALBUMIN 2.4* 2.9* 2.2* 2.2*   No results for input(s): LIPASE, AMYLASE in the last 168 hours. No results for input(s): AMMONIA in the last 168 hours. CBC:  Recent Labs Lab 06/08/14 0948 06/08/14 1700 06/08/14 2140 06/09/14 0429 06/10/14 0428 06/12/14 0500  WBC 11.9*  --  16.6* 18.3* 16.8* 14.3*  NEUTROABS 7.2  --   --   --   --   --   HGB 13.9 10.2* 9.8* 8.5* 7.6* 8.4*  HCT 42.5 33.0* 31.6* 26.9* 23.7* 25.9*  MCV 92.0  --  92.7 91.8 90.1 91.8  PLT 140*  --  156 161 130* 173   Cardiac Enzymes:   No results for input(s): CKTOTAL, CKMB, CKMBINDEX, TROPONINI in the last 168 hours. BNP (last 3 results) No results for input(s): BNP in the last 8760 hours.  ProBNP (last 3 results) No results for input(s): PROBNP in the last 8760 hours.  CBG:  Recent Labs Lab 06/10/14 2151 06/11/14 0630 06/11/14 1157 06/11/14 1625 06/11/14 2207  GLUCAP 115* 88 77 91 107*    Recent Results (from the past 240 hour(s))  Anaerobic culture     Status: None   Collection Time: 06/08/14 11:55 AM  Result Value Ref Range Status   Specimen Description TISSUE THIGH LEFT  Final   Special Requests NONE  Final   Gram Stain   Final    ABUNDANT WBC PRESENT,BOTH PMN AND MONONUCLEAR ABUNDANT GRAM POSITIVE COCCI IN CLUSTERS IN PAIRS Performed at [facility] Gram Stain Report Called to,Read Back By and Verified With: Gram Stain Report Called to,Read Back By and Verified With: Mendel Corning RN 1320 06/08/14 [place] WILSONM Performed at [facility]    Culture   Final    NO ANAEROBES ISOLATED Performed at [facility]    Report Status 06/13/2014 FINAL  Final  Gram stain     Status: None   Collection Time: 06/08/14 11:55 AM  Result Value Ref Range Status   Specimen Description TISSUE THIGH LEFT  Final   Special Requests NONE  Final   Gram Stain   Final    ABUNDANT WBC PRESENT,BOTH PMN AND MONONUCLEAR ABUNDANT GRAM POSITIVE COCCI IN PAIRS IN  CLUSTERS Gram Stain Report Called to,Read Back By and Verified With: Mendel Corning RN 13:20 06/08/14 ([email or web address])    Report Status 06/08/2014 FINAL  Final  Tissue culture     Status: None   Collection Time: 06/08/14 11:55 AM  Result Value Ref Range Status   Specimen Description TISSUE THIGH LEFT  Final   Special Requests NONE  Final   Gram Stain   Final    ABUNDANT WBC PRESENT,BOTH PMN AND MONONUCLEAR ABUNDANT GRAM POSITIVE COCCI IN CLUSTERS IN PAIRS Performed at [facility] Gram Stain Report Called to,Read Back By and Verified With: Gram Stain Report Called to,Read Back By and Verified With: Mendel Corning RN 1320 06/08/14 [place] WILSONM Performed at [facility]    Culture   Final    MODERATE PROTEUS MIRABILIS Performed at [facility]    Report Status 06/11/2014 FINAL  Final   Organism ID, Bacteria PROTEUS MIRABILIS  Final      Susceptibility   Proteus mirabilis - MIC*    AMPICILLIN >=32 RESISTANT Resistant     AMPICILLIN/SULBACTAM >=32 RESISTANT Resistant     CEFAZOLIN >=64 RESISTANT Resistant     CEFEPIME <=1 SENSITIVE Sensitive     CEFTAZIDIME 4 SENSITIVE Sensitive     CEFTRIAXONE 4 SENSITIVE Sensitive     CIPROFLOXACIN <=0.25 SENSITIVE Sensitive     GENTAMICIN <=1 SENSITIVE Sensitive     IMIPENEM 2 SENSITIVE Sensitive     PIP/TAZO <=4 SENSITIVE Sensitive      TOBRAMYCIN <=1 SENSITIVE Sensitive     TRIMETH/SULFA <=20 SENSITIVE Sensitive     * MODERATE PROTEUS MIRABILIS  Anaerobic culture     Status: None   Collection Time: 06/08/14  1:58 PM  Result Value Ref Range Status   Specimen Description TISSUE  Final   Special Requests REAMINGS OF LEFT FEMUR PT ON ANCEF AND ZOSYN  Final   Gram Stain   Final    ABUNDANT WBC PRESENT,BOTH PMN AND MONONUCLEAR NO ORGANISMS SEEN Performed at [facility] Performed at [facility]    Culture   Final    NO ANAEROBES ISOLATED Performed at [facility]    Report Status 06/13/2014 FINAL  Final  Gram stain     Status: None   Collection Time: 06/08/14  1:58 PM  Result Value Ref Range Status   Specimen Description TISSUE  Final   Special Requests REAMINGS OF LEFT FEMUR  Final   Gram Stain   Final    ABUNDANT WBC PRESENT,BOTH PMN AND MONONUCLEAR NO ORGANISMS SEEN    Report Status 06/08/2014 FINAL  Final  Tissue culture     Status: None   Collection Time: 06/08/14  1:58 PM  Result Value Ref Range Status   Specimen Description TISSUE  Final   Special Requests REAMINGS OF LEFT FEMUR  Final   Gram Stain   Final    ABUNDANT WBC PRESENT,BOTH PMN AND MONONUCLEAR NO ORGANISMS SEEN Performed at [facility] Performed at [facility]    Culture   Final    NO GROWTH 3 DAYS Performed at [facility]    Report Status 06/11/2014 FINAL  Final  MRSA PCR Screening     Status: Abnormal   Collection Time: 06/08/14  6:15 PM  Result Value Ref Range Status   MRSA by PCR POSITIVE (A) NEGATIVE Final    Comment:        The GeneXpert MRSA Assay (FDA approved for NASAL specimens only), is one component  of a comprehensive MRSA colonization surveillance program. It is not intended to diagnose MRSA infection nor to guide or monitor treatment for MRSA infections. RESULT CALLED TO, READ BACK BY AND VERIFIED WITH: RN,GREG HARDUK [id number]  THANEY   Culture, blood  (routine x 2)     Status: None (Preliminary result)   Collection Time: 06/08/14  7:08 PM  Result Value Ref Range Status   Specimen Description BLOOD RIGHT ANTECUBITAL  Final   Special Requests   Final    BOTTLES DRAWN AEROBIC AND ANAEROBIC 5CCBLUE 3CC RED   Culture   Final           BLOOD CULTURE RECEIVED NO GROWTH TO DATE CULTURE WILL BE HELD FOR 5 DAYS BEFORE ISSUING A FINAL NEGATIVE REPORT Note: Culture results may be compromised due to an inadequate volume of blood received in culture bottles. Performed at [facility]    Report Status PENDING  Incomplete  Culture, blood (routine x 2)     Status: None (Preliminary result)   Collection Time: 06/08/14  7:20 PM  Result Value Ref Range Status   Specimen Description BLOOD RIGHT HAND  Final   Special Requests BOTTLES DRAWN AEROBIC ONLY  Final   Culture   Final           BLOOD CULTURE RECEIVED NO GROWTH TO DATE CULTURE WILL BE HELD FOR 5 DAYS BEFORE ISSUING A FINAL NEGATIVE REPORT Performed at [facility]    Report Status PENDING  Incomplete  Urine culture     Status: None   Collection Time: 06/09/14  7:03 AM  Result Value Ref Range Status   Specimen Description URINE, RANDOM  Final   Special Requests NONE  Final   Colony Count NO GROWTH Performed at [facility]   Final   Culture NO GROWTH Performed at [facility]   Final   Report Status 06/10/2014 FINAL  Final     Studies: No results found.  Scheduled Meds: . allopurinol  100 mg Oral BID  . amiodarone  100 mg Oral Daily  . aspirin EC  325 mg Oral Daily  . calcium acetate  667 mg Oral TID WC  . [START ON 06/14/2014] carvedilol  3.125 mg Oral BID WC  . cefTRIAXone (ROCEPHIN)  IV  2 g Intravenous Q24H  . Chlorhexidine Gluconate Cloth  6 each Topical Q0600  . darbepoetin (ARANESP) injection - DIALYSIS  100 mcg Intravenous Q Mon-HD  . docusate sodium  100 mg Oral BID  . doxercalciferol  1 mcg Intravenous Q M,W,F-HD  . heparin  subcutaneous  5,000 Units Subcutaneous 3 times per day  . multivitamin  1 tablet Oral QHS  . mupirocin ointment  1 application Nasal BID  . pravastatin  40 mg Oral q1800  . predniSONE  5 mg Oral Q breakfast    Continuous Infusions: . sodium chloride 10 mL/hr at 06/08/14 1035     Time spent:  Larrell Rapozo MD, PhD  [name] Pager [phone]. If 7PM-7AM, please contact night-coverage at [email or web address], password [facility] 06/13/2014, 8:24 PM  LOS: 5 days

## 2014-06-13 NOTE — Consult Note (Signed)
Physical Medicine and Rehabilitation Consult Reason for Consult: 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/multi-medical Referring Physician: Dr. Carola Frost   HPI: Chad [name] is a 59 y.o. right handed male with history of chronic kidney disease status post failed renal transplant with hemodialysis Monday Wednesday Friday, hepatitis C, diastolic congestive heart failure, diabetes mellitus with multiple foot lesions. Chad [name] lives with his son and primarily used a wheelchair and a walker for stand pivot transfers prior to admission and was receiving home health therapies. 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 Presented 06/08/2014 with purulent drainage from wound and underwent irrigation and debridement of left thigh distal femur per Dr. Carola Frost. Nonweightbearing left lower extremity. Infectious disease follow-up currently maintained on ceftaz and Vanco received with hemodialysis for abundant gram positive cocci on Gram stain that he would continue for approximately 6 weeks as well as contact precautions. Hospital course pain management. Subcutaneous heparin for DVT prophylaxis. Hemodialysis as per renal services. Dressing changes lower extremities for diabetic skin lesions. Physical and occupational therapy evaluations completed. M.D. has requested physical medicine rehabilitation consult.   Review of Systems  Respiratory:       Shortness of breath with exertion  Gastrointestinal: Positive for constipation.       GERD  Genitourinary: Positive for frequency.  Musculoskeletal: Positive for back pain and joint pain.  Neurological: Positive for weakness.  All other systems reviewed and are negative.  Past Medical History  Diagnosis Date  . CKD (chronic kidney disease), stage IV     a. s/p renal transplant 1996.  Marland Kitchen HLD  (hyperlipidemia)     takes Pravastatin daily  . Nonischemic cardiomyopathy     a. unknown etiology, EF 30-35% by echo 09/2011;  b. 09/2011 Lexi MV EF 42%, no ischemia/infarct.  . Systolic CHF     takes Lasix daily  . Atrial flutter     takes Amiodarone daily  . Gout     takes Allopurinol daily  . Hypertension     takes Amlodipine,Hydralazine,Imdur,and Coreg daily  . GERD (gastroesophageal reflux disease)     takes Protonix daily  . Shortness of breath dyspnea     exertion  . Pneumonia 2014  . History of bronchitis     several yrs ago  . Joint pain     right shoulder and left knee  . Sciatica   . Hepatitis C 1996    Hep C  . History of colon polyps   . History of kidney stones   . Urinary frequency   . History of shingles   . Hyperparathyroidism due to renal insufficiency   . Sleep apnea     does not use cpap  . Wound of left leg    Past Surgical History  Procedure Laterality Date  . Nephrectomy transplanted organ Right 79yrs ago  . Insertion of dialysis catheter  06/20/2011    Procedure: INSERTION OF DIALYSIS CATHETER;  Surgeon: [name], MD;  Location: [facility] OR;  Service: Vascular;  Laterality: Right;  Ultrasound guided insertion of right internal jugular dialysis catheter  . Multiple extractions with alveoloplasty  06/27/2011    Procedure: MULTIPLE EXTRACION WITH ALVEOLOPLASTY;  Surgeon: [name], DDS;  Location: [facility] OR;  Service: Oral Surgery;  Laterality: N/A;  Extraction  of tooth # 14 with alveoloplasty  . Hip surgery Left  replacement  . Joint replacement      left hip  . Femur fracture surgery Left 07/09/2012    Dr Deno Etienne  . Femur im nail Left 07/10/2012    ORIF L distal femur;  Surgeon: Toni Arthurs, MD;  Location: [facility] OR;  Service: Orthopedics;  Laterality: Left;  . I&d extremity Left 08/24/2012    Procedure: LEFT LEG IRRIGATION AND DEBRIDEMENT WITH POSSIBLE WOUND VAC APPLICATION;  Surgeon: Toni Arthurs, MD;  Location: [facility] OR;  Service: Orthopedics;   Laterality: Left;  . Tracheostomy  2014  . Tracheostomy closure  2014    2 wks after it was done  . Colonsocopy    . Non union Left 03/14/2014    LEFT FEMER REPAIR     BY DR HANDY  . Hardware removal Left 03/14/2014    Procedure: HARDWARE REMOVAL LEFT;  Surgeon: Myrene Galas, MD;  Location: [facility] OR;  Service: Orthopedics;  Laterality: Left;  . Wound debridement Left 03/14/2014    Procedure: DEBRIDEMENT WOUND;  Surgeon: Myrene Galas, MD;  Location: [facility] OR;  Service: Orthopedics;  Laterality: Left;  INSERTION OF  ANTIBIOTIC Beads  (2 strands: 15 beads and 14 beads)  . Av fistula placement Left 06/01/2014    Procedure: LEFT BRACHIOCEPHALIC ARTERIOVENOUS (AV) FISTULA CREATION;  Surgeon: Pryor Ochoa, MD;  Location: [facility] OR;  Service: Vascular;  Laterality: Left;  . Incision and drainage Left 06/08/2014    Procedure: INCISION AND DRAINAGE OF LEFT THIGH;  Surgeon: Myrene Galas, MD;  Location: [facility] OR;  Service: Orthopedics;  Laterality: Left;   Family History  Problem Relation Age of Onset  . Heart disease Neg Hx    Social History:  reports that he has never smoked. He has never used smokeless tobacco. He reports that he does not drink alcohol or use illicit drugs. Allergies: No Known Allergies Medications Prior to Admission  Medication Sig Dispense Refill  . allopurinol (ZYLOPRIM) 100 MG tablet Take 1 tablet (100 mg total) by mouth 2 (two) times daily. 60 tablet 1  . amiodarone (PACERONE) 100 MG tablet TAKE 1 TABLET BY MOUTH EVERY DAY 30 tablet 6  . amiodarone (PACERONE) 100 MG tablet Take 1 tablet (100 mg total) by mouth daily. 30 tablet 0  . amLODipine (NORVASC) 2.5 MG tablet TAKE 1 TABLET (2.5 MG TOTAL) BY MOUTH DAILY. 30 tablet 6  . amLODipine (NORVASC) 2.5 MG tablet TAKE 1 TABLET (2.5 MG TOTAL) BY MOUTH DAILY. 30 tablet 6  . amoxicillin-clavulanate (AUGMENTIN) 500-125 MG per tablet TAKE 1 TABLET BY MOUTH TWICE A DAY 60 tablet 3  . aspirin 325 MG tablet Take 325 mg by mouth daily.    .  calcitRIOL (ROCALTROL) 0.5 MCG capsule Take 0.5 mcg by mouth daily.  11  . carvedilol (COREG) 25 MG tablet TAKE 1 TABLET TWICE DAILY WITH A MEAL 60 tablet 3  . Cholecalciferol (VITAMIN D-3) 5000 UNITS TABS Take 5,000 Units by mouth daily.     Marland Kitchen docusate sodium (COLACE) 100 MG capsule Take 1 capsule (100 mg total) by mouth 2 (two) times daily. 10 capsule 0  . hydrALAZINE (APRESOLINE) 100 MG tablet TAKE 1 TABLET (100 MG TOTAL) BY MOUTH 3 (THREE) TIMES DAILY. 90 tablet 3  . isosorbide mononitrate (IMDUR) 60 MG 24 hr tablet TAKE 1.5 TABLETS BY MOUTH EVERY DAY 45 tablet 2  . metolazone (ZAROXOLYN) 2.5 MG tablet TAKE 1 TABLET BY MOUTH 30 MINUTES BEFORE YOUR MORNING LASIX ON TUES, THURS, AND SAT MORNINGS 15 tablet 3  .  oxyCODONE (OXY IR/ROXICODONE) 5 MG immediate release tablet Take 1-2 tablets (5-10 mg total) by mouth every 3 (three) hours as needed for breakthrough pain. 70 tablet 0  . pravastatin (PRAVACHOL) 40 MG tablet TAKE 1 TABLET (40 MG TOTAL) BY MOUTH DAILY. 30 tablet 6  . predniSONE (DELTASONE) 10 MG tablet Take 1 tablet (10 mg total) by mouth daily. (Patient taking differently: Take 10 mg by mouth daily. Pt. Taking 1/2 tablet daily) 30 tablet 1  . Probiotic Product (PROBIOTIC DAILY PO) Take 1 capsule by mouth daily.     . traMADol (ULTRAM) 50 MG tablet Take 1 tablet (50 mg total) by mouth every 6 (six) hours as needed (pain). As needed for pain 30 tablet 0  . acetaminophen (TYLENOL) 325 MG tablet Take 2 tablets (650 mg total) by mouth every 6 (six) hours as needed for moderate pain or fever.      Home: Home Living Family/patient expects to be discharged to:: Unsure Additional Comments: Pt currently living with son who is gone for work during the days.  Functional History: Prior Function Level of Independence: Needs assistance Gait / Transfers Assistance Needed: Able to transfer with slide board to w/c and states he has not been able to SPT in a long time.  ADL's / Homemaking Assistance  Needed: Pt states son assists him in getting washed up.  Functional Status:  Mobility: Bed Mobility Overal bed mobility: Needs Assistance, + 2 for safety/equipment Bed Mobility: Supine to Sit, Sit to Supine Supine to sit: Min assist, +2 for safety/equipment Sit to supine: Min assist, +2 for physical assistance, +2 for safety/equipment General bed mobility comments: Assist for LLE into and OOB.  Pt w/ support posteriorly using bed pad to guide pt's hips during sit>supine.   Transfers Overall transfer level: Needs assistance Equipment used: Rolling walker (2 wheeled) Transfers: Sit to/from Stand Sit to Stand: Mod assist, +2 physical assistance, From elevated surface General transfer comment: Verbal cues for hand placement, requiring mod assist +2 to power up to standing.  Sit>stand performed x2.  Pt stood EOB 5 sec and 10 sec for first and second attempts respectively.  Pt w/ R knee buckle reason for return to sitting EOB both times. Ambulation/Gait General Gait Details: Pt has not been ambulating for some time PTA. He is at a transfers only level at this point.     ADL: ADL Overall ADL's : Needs assistance/impaired Eating/Feeding: Independent, Sitting Grooming: Set up, Sitting Upper Body Bathing: Set up, Sitting Lower Body Bathing: Maximal assistance, +2 for physical assistance, Sit to/from stand Upper Body Dressing : Set up, Sitting Lower Body Dressing: Total assistance, +2 for physical assistance, Sit to/from stand Toilet Transfer: Moderate assistance, +2 for physical assistance, RW Toilet Transfer Details (indicate cue type and reason): sit<>stand from elevated EOB only. Pt reports pivoting is very difficult Toileting- Clothing Manipulation and Hygiene: Total assistance, +2 for physical assistance, Sit to/from stand General ADL Comments: Pt is limited by LEs and weakness.   Cognition: Cognition Overall Cognitive Status: Within Functional Limits for tasks assessed Orientation  Level: Oriented X4 Cognition Arousal/Alertness: Awake/alert Behavior During Therapy: Flat affect Overall Cognitive Status: Within Functional Limits for tasks assessed  Blood pressure 120/68, pulse 79, temperature 98.9 F (37.2 C), temperature source Oral, resp. rate 18, height 6' (1.829 m), weight 109 kg (240 lb 4.8 oz), SpO2 95 %. Physical Exam  Constitutional: He is oriented to person, place, and time.  Morbid obesity  HENT:  Head: Normocephalic.  Eyes: EOM are  normal.  Neck: Normal range of motion. Neck supple. No thyromegaly present.  Cardiovascular: Normal rate and regular rhythm.   Respiratory: Effort normal and breath sounds normal. No respiratory distress.  GI: Soft. Bowel sounds are normal. He exhibits no distension. There is no tenderness. There is no rebound.  Musculoskeletal: He exhibits edema and tenderness.  Neurological: He is alert and oriented to person, place, and time.  UE: 3/5 deltoid, 4/5 bicep/tricep/wirst. LE: 2/5 hf, rke 3+/5, lke 1+/5, ankles limited by wraps but grossly 3/5. Senses pain and light touch in both legs and feet.   Skin:  Unna boots in place lower extremities surgical site is dressed and appropriately tender. Left middle toe dressed. Left knee with ace/dressing also  Psychiatric: He has a normal mood and affect. His behavior is normal. Thought content normal.    Results for orders placed or performed during the hospital encounter of 06/08/14 (from the past 24 hour(s))  Occult blood card to lab, stool     Status: None   Collection Time: 06/12/14  9:55 AM  Result Value Ref Range   Fecal Occult Bld NEGATIVE NEGATIVE   No results found.  Assessment/Plan: Diagnosis:  Chronic osteomyelitis, chronic bicondylar femur fx LLE 1. Does the need for close, 24 hr/day medical supervision in concert with the patient's rehab needs make it unreasonable for this patient to be served in a less intensive setting? Yes 2. Co-Morbidities requiring  supervision/potential complications: ESRD, aflutter, chf, htn, morbid obesity 3. Due to bladder management, bowel management, safety, skin/wound care, disease management, medication administration, pain management and patient education, does the patient require 24 hr/day rehab nursing? Yes 4. Does the patient require coordinated care of a physician, rehab nurse, PT (1-2 hrs/day, 5 days/week) and OT (1-2 hrs/day, 5 days/week) to address physical and functional deficits in the context of the above medical diagnosis(es)? Yes Addressing deficits in the following areas: balance, endurance, locomotion, strength, transferring, bowel/bladder control, bathing, dressing, feeding, grooming, toileting and psychosocial support 5. Can the patient actively participate in an intensive therapy program of at least 3 hrs of therapy per day at least 5 days per week? Yes 6. The potential for patient to make measurable gains while on inpatient rehab is good 7. Anticipated functional outcomes upon discharge from inpatient rehab are modified independent  with PT, modified independent with OT, n/a with SLP. 8. Estimated rehab length of stay to reach the above functional goals is: 9-12 days 9. Does the patient have adequate social supports and living environment to accommodate these discharge functional goals? Yes 10. Anticipated D/C setting: Home 11. Anticipated post D/C treatments: HH therapy and Outpatient therapy 12. Overall Rehab/Functional Prognosis: excellent  RECOMMENDATIONS: This patient's condition is appropriate for continued rehabilitative care in the following setting: CIR Patient has agreed to participate in recommended program. Yes Note that insurance prior authorization may be required for reimbursement for recommended care.  Comment: Rehab Admissions Coordinator to follow up.  Thanks,  Ranelle Oyster, MD, [place] [facility]     06/13/2014

## 2014-06-13 NOTE — Progress Notes (Signed)
Occupational Therapy Treatment Patient Details Name: Chad [name] MRN: [id number] DOB: 1956/01/09 Today's Date: 06/13/2014    History of present illness Pt is a 59 y/o male with a PMH of CKD s/p renal transplant, cardiomyopathy, CHF, Hep C. 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 Pt with previous IM nailing, and hardware was removed and replaced with abx beads in 02/2014 to try and address this. Pt is now s/p I&D of his L thigh/distal femur.    OT comments  Pt making steady progress. Feel pt would be an excellent CIR candidate to return home @ w/c level. Will continue to follow acutely to address established goals.  Follow Up Recommendations  CIR;Supervision/Assistance - 24 hour    Equipment Recommendations  None recommended by OT    Recommendations for Other Services Rehab consult    Precautions / Restrictions Precautions Precautions: Fall Precaution Comments: Unna boot on RLE - pt has wounds there as well Required Braces or Orthoses: Other Brace/Splint Other Brace/Splint: PRAFO LLE Restrictions LLE Weight Bearing: Non weight bearing       Mobility Bed Mobility               General bed mobility comments: OOB in w/c                        Balance                                   ADL                                         General ADL Comments: Worked on lateral leans inpreqapartion for LB ADL @ sitting level. Pt wanted to stay in w/c.                                      Cognition   Behavior During Therapy: WFL for tasks assessed/performed Overall Cognitive Status: Within Functional Limits for tasks assessed                       Extremity/Trunk Assessment   general BUE weakness. Apparent RTC insufficiency R shoulder            Exercises Other Exercises Other Exercises: theraband level 2 BUE strengthening elbow flex/ext; ER; scapular retraction; shouler  flexion as tolerated. Other Exercises: w/c push ups   Shoulder Instructions       General Comments      Pertinent Vitals/ Pain       Pain Assessment: 0-10 Pain Score: 4  Pain Location: LLE Pain Descriptors / Indicators: Aching Pain Intervention(s): Limited activity within patient's tolerance  Home Living                                          Prior Functioning/Environment              Frequency Min 2X/week     Progress Toward Goals  OT Goals(current goals can now be found in the care plan section)  Progress towards OT goals: Progressing toward goals  Acute Rehab  OT Goals Patient Stated Goal: get back to where I was before OT Goal Formulation: With patient Time For Goal Achievement: 06/25/14 Potential to Achieve Goals: Good ADL Goals Pt Will Perform Lower Body Bathing: with min assist;sitting/lateral leans Pt Will Perform Lower Body Dressing: with min assist;sitting/lateral leans Pt Will Transfer to Toilet: with min assist;stand pivot transfer;bedside commode Pt Will Perform Toileting - Clothing Manipulation and hygiene: with min assist;sit to/from stand  Plan Discharge plan needs to be updated    Co-evaluation                 End of Session     Activity Tolerance Patient tolerated treatment well   Patient Left in chair;with call bell/phone within reach   Nurse Communication Mobility status        Time: 1445-1500 OT Time Calculation (min): 15 min  Charges: OT General Charges $OT Visit: 1 Procedure OT Treatments $Therapeutic Activity: 8-22 mins  Brandace Cargle,HILLARY 06/13/2014, 3:41 PM  [facility], OTR/L  [phone] 06/13/2014

## 2014-06-14 DIAGNOSIS — I5022 Chronic systolic (congestive) heart failure: Secondary | ICD-10-CM

## 2014-06-14 MED ORDER — VANCOMYCIN HCL IN DEXTROSE 1-5 GM/200ML-% IV SOLN
1000.0000 mg | INTRAVENOUS | Status: DC
  Filled 2014-06-14: qty 200

## 2014-06-14 MED ORDER — DOXERCALCIFEROL 4 MCG/2ML IV SOLN
INTRAVENOUS | Status: AC
  Filled 2014-06-14: qty 2

## 2014-06-14 MED ORDER — OXYCODONE HCL 5 MG PO TABS
ORAL_TABLET | ORAL | Status: AC
Start: 2014-06-14 — End: 2014-06-15
  Filled 2014-06-14: qty 2

## 2014-06-14 MED ORDER — CEFTAZIDIME 2 G IJ SOLR
2.0000 g | INTRAMUSCULAR | Status: DC
  Administered 2014-06-14 – 2014-06-19 (×3): 2 g via INTRAVENOUS
  Filled 2014-06-14 (×5): qty 2

## 2014-06-14 MED ORDER — VANCOMYCIN HCL 10 G IV SOLR
2000.0000 mg | INTRAVENOUS | Status: AC
  Administered 2014-06-14: 2000 mg via INTRAVENOUS
  Filled 2014-06-14: qty 2000

## 2014-06-14 NOTE — Progress Notes (Signed)
Subjective:   Frustrated that he may need amputation   Objective Filed Vitals:   06/13/14 0458 06/13/14 1252 06/13/14 2127 06/14/14 0602  BP: 120/68 93/57 117/53 123/73  Pulse: 79 80 77 90  Temp: 98.9 F (37.2 C) 98.9 F (37.2 C) 98.4 F (36.9 C) 98.6 F (37 C)  TempSrc: Oral  Oral Oral  Resp: 18 18 17 19   Height:      Weight:      SpO2: 95% 99% 94% 93%   Physical Exam General: alert and oriented, no acute distress.  Heart: RRR  Lungs: CTA, unlabored.  Abdomen: soft, nontender +BS  Extremities: bilat legs wrapped. No obvious edema  Dialysis Access: R IJ cath. L AVF +b/t   Dialysis Orders: [facility]WF [facility] 4hr 30 mins 103.5kgs 2K/2.5ca 5000u heparin Hectorol 1   Assessment: 1. L distal femur osteomyelitis - sp I&D w new resorbable beads placed, wound culture + for Proteus, on vanc and fortaz- Dr Lajoyce Corners recommends AKA 2. Septic shock - resolved, off pressors, moved from ICU 5/14. 3. ESRD - HD on MWF  HD pending today 4. Hypotension/volume - BP 123/73, Carvedilol decreased to 3.125 mg bid, still above edw  5. Anemia - Hgb 8.4- up from 7.6, started Aranesp 100 mcg 5/16 6. Sec HPT - Ca 8.2 (9.6 corrected), P 3.7; Hectorol 1 mcg, Phoslo 1 with meals. 7. Nutrition - Alb 2.2, renal diet, vitamin. 8. Dispo- eval for inpt rehab  Jetty Duhamel, NP [facility] Beeper [phone] 06/14/2014,11:46 AM  LOS: 6 days   Pt seen, examined and agree w A/P as above.  Vinson Moselle MD pager [phone]    cell [phone] 06/14/2014, 12:03 PM    Additional Objective Labs: Basic Metabolic Panel:  Recent Labs Lab 06/09/14 0429 06/10/14 0428 06/12/14 0500  NA 130* 131* 132*  K 3.6 4.2 3.6  CL 94* 96* 96*  CO2 24 25 24   GLUCOSE 140* 126* 87  BUN 26* 38* 32*  CREATININE 4.07* 4.89* 4.31*  CALCIUM 8.3* 8.1* 8.2*  PHOS 5.3* 6.1* 3.7   Liver Function Tests:  Recent Labs Lab 06/08/14 0948 06/08/14 1908 06/10/14 0428 06/12/14 0500  AST 19  --   --   --    ALT 7*  --   --   --   ALKPHOS 65  --   --   --   BILITOT 0.5  --   --   --   PROT 8.0  --   --   --   ALBUMIN 2.4* 2.9* 2.2* 2.2*   No results for input(s): LIPASE, AMYLASE in the last 168 hours. CBC:  Recent Labs Lab 06/08/14 0948  06/08/14 2140 06/09/14 0429 06/10/14 0428 06/12/14 0500  WBC 11.9*  --  16.6* 18.3* 16.8* 14.3*  NEUTROABS 7.2  --   --   --   --   --   HGB 13.9  < > 9.8* 8.5* 7.6* 8.4*  HCT 42.5  < > 31.6* 26.9* 23.7* 25.9*  MCV 92.0  --  92.7 91.8 90.1 91.8  PLT 140*  --  156 161 130* 173  < > = values in this interval not displayed. Blood Culture    Component Value Date/Time   SDES URINE, RANDOM 06/09/2014 0703   SPECREQUEST NONE 06/09/2014 0703   CULT NO GROWTH Performed at [facility]  06/09/2014 0703   REPTSTATUS 06/10/2014 FINAL 06/09/2014 0703    Cardiac Enzymes: No results for input(s): CKTOTAL, CKMB, CKMBINDEX, TROPONINI in the last  168 hours. CBG:  Recent Labs Lab 06/10/14 2151 06/11/14 0630 06/11/14 1157 06/11/14 1625 06/11/14 2207  GLUCAP 115* 88 77 91 107*   Iron Studies: No results for input(s): IRON, TIBC, TRANSFERRIN, FERRITIN in the last 72 hours. @ Studies/Results: No results found. Medications: . sodium chloride 10 mL/hr at 06/08/14 1035   . allopurinol  100 mg Oral BID  . amiodarone  100 mg Oral Daily  . aspirin EC  325 mg Oral Daily  . calcium acetate  667 mg Oral TID WC  . carvedilol  3.125 mg Oral BID WC  . cefTAZidime (FORTAZ)  IV  2 g Intravenous Q M,W,F-HD  . darbepoetin (ARANESP) injection - DIALYSIS  100 mcg Intravenous Q Mon-HD  . docusate sodium  100 mg Oral BID  . doxercalciferol  1 mcg Intravenous Q M,W,F-HD  . heparin subcutaneous  5,000 Units Subcutaneous 3 times per day  . multivitamin  1 tablet Oral QHS  . pravastatin  40 mg Oral q1800  . predniSONE  5 mg Oral Q breakfast  . vancomycin  2,000 mg Intravenous Q Wed-HD  . [START ON 06/19/2014] vancomycin  1,000 mg Intravenous Q  M,W,F-HD

## 2014-06-14 NOTE — Progress Notes (Signed)
ANTIBIOTIC CONSULT NOTE - INITIAL  Pharmacy Consult for Vancomycin, Ceftazidime  Indication: Osteomyelitis   No Known Allergies  Patient Measurements: Height: 6' (182.9 cm) Weight: 240 lb 4.8 oz (109 kg) (with leg brace on ) IBW/kg (Calculated) : 77.6  Vital Signs: Temp: 98.6 F (37 C) (05/18 0602) Temp Source: Oral (05/18 0602) BP: 123/73 mmHg (05/18 0602) Pulse Rate: 90 (05/18 0602) Intake/Output from previous day: 05/17 0701 - 05/18 0700 In: 890 [P.O.:840; IV Piggyback:50] Out: 75 [Urine:75] Intake/Output from this shift:    Labs:  Recent Labs  06/12/14 0500  WBC 14.3*  HGB 8.4*  PLT 173  CREATININE 4.31*   Estimated Creatinine Clearance: 24.1 mL/min (by C-G formula based on Cr of 4.31). No results for input(s): VANCOTROUGH, VANCOPEAK, VANCORANDOM, GENTTROUGH, GENTPEAK, GENTRANDOM, TOBRATROUGH, TOBRAPEAK, TOBRARND, AMIKACINPEAK, AMIKACINTROU, AMIKACIN in the last 72 hours.   Microbiology: Recent Results (from the past 720 hour(s))  Anaerobic culture     Status: None   Collection Time: 06/08/14 11:55 AM  Result Value Ref Range Status   Specimen Description TISSUE THIGH LEFT  Final   Special Requests NONE  Final   Gram Stain   Final    ABUNDANT WBC PRESENT,BOTH PMN AND MONONUCLEAR ABUNDANT GRAM POSITIVE COCCI IN CLUSTERS IN PAIRS Performed at [facility] Gram Stain Report Called to,Read Back By and Verified With: Gram Stain Report Called to,Read Back By and Verified With: Mendel Corning RN 1320 06/08/14 [place] WILSONM Performed at [facility]    Culture   Final    NO ANAEROBES ISOLATED Performed at [facility]    Report Status 06/13/2014 FINAL  Final  Gram stain     Status: None   Collection Time: 06/08/14 11:55 AM  Result Value Ref Range Status   Specimen Description TISSUE THIGH LEFT  Final   Special Requests NONE  Final   Gram Stain   Final    ABUNDANT WBC PRESENT,BOTH PMN AND MONONUCLEAR ABUNDANT GRAM POSITIVE COCCI IN PAIRS IN  CLUSTERS Gram Stain Report Called to,Read Back By and Verified With: Mendel Corning RN 13:20 06/08/14 ([email or web address])    Report Status 06/08/2014 FINAL  Final  Tissue culture     Status: None   Collection Time: 06/08/14 11:55 AM  Result Value Ref Range Status   Specimen Description TISSUE THIGH LEFT  Final   Special Requests NONE  Final   Gram Stain   Final    ABUNDANT WBC PRESENT,BOTH PMN AND MONONUCLEAR ABUNDANT GRAM POSITIVE COCCI IN CLUSTERS IN PAIRS Performed at [facility] Gram Stain Report Called to,Read Back By and Verified With: Gram Stain Report Called to,Read Back By and Verified With: Mendel Corning RN 1320 06/08/14 [place]ILSONM Performed at [facility]    Culture   Final    MODERATE PROTEUS MIRABILIS Performed at [facility]    Report Status 06/11/2014 FINAL  Final   Organism ID, Bacteria PROTEUS MIRABILIS  Final      Susceptibility   Proteus mirabilis - MIC*    AMPICILLIN >=32 RESISTANT Resistant     AMPICILLIN/SULBACTAM >=32 RESISTANT Resistant     CEFAZOLIN >=64 RESISTANT Resistant     CEFEPIME <=1 SENSITIVE Sensitive     CEFTAZIDIME 4 SENSITIVE Sensitive     CEFTRIAXONE 4 SENSITIVE Sensitive     CIPROFLOXACIN <=0.25 SENSITIVE Sensitive     GENTAMICIN <=1 SENSITIVE Sensitive     IMIPENEM 2 SENSITIVE Sensitive     PIP/TAZO <=4 SENSITIVE Sensitive  TOBRAMYCIN <=1 SENSITIVE Sensitive     TRIMETH/SULFA <=20 SENSITIVE Sensitive     * MODERATE PROTEUS MIRABILIS  Anaerobic culture     Status: None   Collection Time: 06/08/14  1:58 PM  Result Value Ref Range Status   Specimen Description TISSUE  Final   Special Requests REAMINGS OF LEFT FEMUR PT ON ANCEF AND ZOSYN  Final   Gram Stain   Final    ABUNDANT WBC PRESENT,BOTH PMN AND MONONUCLEAR NO ORGANISMS SEEN Performed at [facility] Performed at [facility]    Culture   Final    NO ANAEROBES ISOLATED Performed at [facility]    Report Status 06/13/2014 FINAL  Final  Gram  stain     Status: None   Collection Time: 06/08/14  1:58 PM  Result Value Ref Range Status   Specimen Description TISSUE  Final   Special Requests REAMINGS OF LEFT FEMUR  Final   Gram Stain   Final    ABUNDANT WBC PRESENT,BOTH PMN AND MONONUCLEAR NO ORGANISMS SEEN    Report Status 06/08/2014 FINAL  Final  Tissue culture     Status: None   Collection Time: 06/08/14  1:58 PM  Result Value Ref Range Status   Specimen Description TISSUE  Final   Special Requests REAMINGS OF LEFT FEMUR  Final   Gram Stain   Final    ABUNDANT WBC PRESENT,BOTH PMN AND MONONUCLEAR NO ORGANISMS SEEN Performed at [facility] Performed at [facility]    Culture   Final    NO GROWTH 3 DAYS Performed at [facility]    Report Status 06/11/2014 FINAL  Final  MRSA PCR Screening     Status: Abnormal   Collection Time: 06/08/14  6:15 PM  Result Value Ref Range Status   MRSA by PCR POSITIVE (A) NEGATIVE Final    Comment:        The GeneXpert MRSA Assay (FDA approved for NASAL specimens only), is one component of a comprehensive MRSA colonization surveillance program. It is not intended to diagnose MRSA infection nor to guide or monitor treatment for MRSA infections. RESULT CALLED TO, READ BACK BY AND VERIFIED WITH: RN,GREG HARDUK [id number]  THANEY   Culture, blood (routine x 2)     Status: None (Preliminary result)   Collection Time: 06/08/14  7:08 PM  Result Value Ref Range Status   Specimen Description BLOOD RIGHT ANTECUBITAL  Final   Special Requests   Final    BOTTLES DRAWN AEROBIC AND ANAEROBIC 5CCBLUE 3CC RED   Culture   Final           BLOOD CULTURE RECEIVED NO GROWTH TO DATE CULTURE WILL BE HELD FOR 5 DAYS BEFORE ISSUING A FINAL NEGATIVE REPORT Note: Culture results may be compromised due to an inadequate volume of blood received in culture bottles. Performed at [facility]    Report Status PENDING  Incomplete  Culture, blood (routine x 2)     Status:  None (Preliminary result)   Collection Time: 06/08/14  7:20 PM  Result Value Ref Range Status   Specimen Description BLOOD RIGHT HAND  Final   Special Requests BOTTLES DRAWN AEROBIC ONLY  Final   Culture   Final           BLOOD CULTURE RECEIVED NO GROWTH TO DATE CULTURE WILL BE HELD FOR 5 DAYS BEFORE ISSUING A FINAL NEGATIVE REPORT Performed at [facility]    Report Status PENDING  Incomplete  Urine culture     Status: None   Collection Time: 06/09/14  7:03 AM  Result Value Ref Range Status   Specimen Description URINE, RANDOM  Final   Special Requests NONE  Final   Colony Count NO GROWTH Performed at [facility]   Final   Culture NO GROWTH Performed at [facility]   Final   Report Status 06/10/2014 FINAL  Final    Medical History: Past Medical History  Diagnosis Date  . CKD (chronic kidney disease), stage IV     a. s/p renal transplant 1996.  Marland Kitchen HLD (hyperlipidemia)     takes Pravastatin daily  . Nonischemic cardiomyopathy     a. unknown etiology, EF 30-35% by echo 09/2011;  b. 09/2011 Lexi MV EF 42%, no ischemia/infarct.  . Systolic CHF     takes Lasix daily  . Atrial flutter     takes Amiodarone daily  . Gout     takes Allopurinol daily  . Hypertension     takes Amlodipine,Hydralazine,Imdur,and Coreg daily  . GERD (gastroesophageal reflux disease)     takes Protonix daily  . Shortness of breath dyspnea     exertion  . Pneumonia 2014  . History of bronchitis     several yrs ago  . Joint pain     right shoulder and left knee  . Sciatica   . Hepatitis C 1996    Hep C  . History of colon polyps   . History of kidney stones   . Urinary frequency   . History of shingles   . Hyperparathyroidism due to renal insufficiency   . Sleep apnea     does not use cpap  . Wound of left leg     Medications:  Prescriptions prior to admission  Medication Sig Dispense Refill Last Dose  . allopurinol (ZYLOPRIM) 100 MG tablet Take 1 tablet (100  mg total) by mouth 2 (two) times daily. 60 tablet 1 06/08/2014 at 0700  . amiodarone (PACERONE) 100 MG tablet TAKE 1 TABLET BY MOUTH EVERY DAY 30 tablet 6 06/08/2014 at 0700  . amiodarone (PACERONE) 100 MG tablet Take 1 tablet (100 mg total) by mouth daily. 30 tablet 0   . amLODipine (NORVASC) 2.5 MG tablet TAKE 1 TABLET (2.5 MG TOTAL) BY MOUTH DAILY. 30 tablet 6 06/08/2014 at 0700  . amLODipine (NORVASC) 2.5 MG tablet TAKE 1 TABLET (2.5 MG TOTAL) BY MOUTH DAILY. 30 tablet 6   . amoxicillin-clavulanate (AUGMENTIN) 500-125 MG per tablet TAKE 1 TABLET BY MOUTH TWICE A DAY 60 tablet 3 06/07/2014 at Unknown time  . aspirin 325 MG tablet Take 325 mg by mouth daily.   06/07/2014 at Unknown time  . calcitRIOL (ROCALTROL) 0.5 MCG capsule Take 0.5 mcg by mouth daily.  11 06/07/2014 at Unknown time  . carvedilol (COREG) 25 MG tablet TAKE 1 TABLET TWICE DAILY WITH A MEAL 60 tablet 3 06/08/2014 at 0700  . Cholecalciferol (VITAMIN D-3) 5000 UNITS TABS Take 5,000 Units by mouth daily.    06/07/2014 at Unknown time  . docusate sodium (COLACE) 100 MG capsule Take 1 capsule (100 mg total) by mouth 2 (two) times daily. 10 capsule 0 06/07/2014 at Unknown time  . hydrALAZINE (APRESOLINE) 100 MG tablet TAKE 1 TABLET (100 MG TOTAL) BY MOUTH 3 (THREE) TIMES DAILY. 90 tablet 3 06/08/2014 at 0700  . isosorbide mononitrate (IMDUR) 60 MG 24 hr tablet TAKE 1.5 TABLETS BY MOUTH EVERY DAY 45 tablet 2 06/08/2014 at  0700  . metolazone (ZAROXOLYN) 2.5 MG tablet TAKE 1 TABLET BY MOUTH 30 MINUTES BEFORE YOUR MORNING LASIX ON TUES, THURS, AND SAT MORNINGS 15 tablet 3 not taking  . oxyCODONE (OXY IR/ROXICODONE) 5 MG immediate release tablet Take 1-2 tablets (5-10 mg total) by mouth every 3 (three) hours as needed for breakthrough pain. 70 tablet 0 06/07/2014 at Unknown time  . pravastatin (PRAVACHOL) 40 MG tablet TAKE 1 TABLET (40 MG TOTAL) BY MOUTH DAILY. 30 tablet 6 06/07/2014 at Unknown time  . predniSONE (DELTASONE) 10 MG tablet Take 1 tablet  (10 mg total) by mouth daily. (Patient taking differently: Take 10 mg by mouth daily. Pt. Taking 1/2 tablet daily) 30 tablet 1 06/07/2014 at Unknown time  . Probiotic Product (PROBIOTIC DAILY PO) Take 1 capsule by mouth daily.    06/07/2014 at Unknown time  . traMADol (ULTRAM) 50 MG tablet Take 1 tablet (50 mg total) by mouth every 6 (six) hours as needed (pain). As needed for pain 30 tablet 0 not taking  . acetaminophen (TYLENOL) 325 MG tablet Take 2 tablets (650 mg total) by mouth every 6 (six) hours as needed for moderate pain or fever.   More than a month at Unknown time   Assessment: 38 YOM with chronic infection of L distal femur. Had removal of old hardware and L femur union. Currently on IV ceftriaxone which is being switched to IV Vancomycin and IV Ceftazidime x 6 weeks per ID.   5/12 Zosyn x 1 dose  5/12 Vanc>> 5/14; 5/18>> 5/12 Merrem>> 5/15 5/15 CTX >>5/18 5/18 Ceftazidime >>   5/12: Tissue >> proteus mirables 5/12 Blood Cx x2>> nGTD  5/13 UCx >> neg   Goal of Therapy:  Pre-HD Vancomycin level 15-25 mcg/ml  Plan:  -Vancomycin 2 gm IV today to make up HD on 5/16, followed by 1 gm IV Q HD starting on Monday  -Ceftazidime 2 gm IV Q HD on M/W/F  -Monitor CBC, cultures and clinical progress -Pre-HD VR at steady state   Vinnie Level, PharmD., BCPS Clinical Pharmacist Pager [phone]

## 2014-06-14 NOTE — Progress Notes (Signed)
Patient ID: Chad [name], male   DOB: [birth year], 59 y.o.   MRN: [id number] I had a long discussion with the patient this morning regarding limb salvage versus above-the-knee amputation. Discussed that in an ideal situation if the infection was able to be suppressed patient currently does not have sufficient bone integrity to support his body weight. I feel the best option for patient to be able to be independent with activities weightbearing as tolerated would be to proceed with an above-the-knee amputation. Patient states that he would like to think about this.

## 2014-06-14 NOTE — Progress Notes (Signed)
Rehab admissions - Evaluated for possible admission.  I met with patient at his bedside.  He tells me that his son lives with him.  He would like to come to inpatient rehab.  He went to [facility] previously and did not like being there.  I will open the case with [facility] and send clinicals requesting acute inpatient rehab admission.  Patient also tells me that he does not want to lose his leg.  I will update all once I hear back from [facility].  Call me for questions.  #[phone]

## 2014-06-14 NOTE — Op Note (Signed)
NAMEGIACOMO, [name] NO.:  [id number]  MEDICAL RECORD NO.:  [id number]  LOCATION:                                FACILITY:  [facility]  PHYSICIAN:  Doralee Albino. Carola Frost, M.D. DATE OF BIRTH:  01/21/1956  DATE OF PROCEDURE:  06/08/2014 DATE OF DISCHARGE:                              OPERATIVE REPORT   PREOPERATIVE DIAGNOSES: 1. 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 2. Chronic osteomyelitis of the left femur. 3. Possible septic arthritis. 4. Retained antibiotic cement beads.  POSTOPERATIVE DIAGNOSES: 1. 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 2. Chronic osteomyelitis of the left femur. 3. Septic arthritis left knee. 4. Retained antibiotic cement beads.  PROCEDURE: 1. Partial excision, left femur for osteomyelitis. 2. Removal arthrotomy of the left knee with lavage and  evacuation for     septic arthritis. 3. Removal and replacement of antibiotic beads. 4. Stress fluoroscopy of the supracondylar femur.  SURGEON:  Doralee Albino. Carola Frost, MD.  ASSISTANT:  Montez Morita, PA-C.  ANESTHESIA:  General.  DISPOSITION:  To PACU.  CONDITION:  Hemodynamically stable.  SPECIMENS:  Multiple anaerobic and aerobic sent to Micro including intramedullary contents.  BRIEF SUMMARY AND INDICATIONS FOR PROCEDURE:  Chad [name] is a very pleasant 59 year old male with  multiple medical problems including hepatitis, renal transplant with chronic prednisone therapy, nonischemic cardiomyopathy with an EF of approximately 30% and a long-standing left femur osteo and nonunion.  The patient underwent removal of hardware and placement of antibiotic beads in the past and now presents for repeat I and D, given his recurrent draining infection.  He is now on dialysis. I discussed with the patient and his daughter and sister the risks and benefits of the procedure including heart attack, stroke, persistent infection, need for further surgery, DVT, PE, loss of motion, persistent nonunion,  loss of ambulatory status, bleeding complications, and others. They acknowledged these risk and did wish to proceed.  I did obtain cultures from the office and these are pending at the time of the procedure.  Chad [name] did not have any antibiotics administered because of the desire to obtain additional cultures, taken to the operating room and anesthesia was induced.  His left leg was  prepped and draped in usual sterile fashion.  He has Warehouse manager on bilaterally and these were maintained.  The old incision was reopened excising the sinus track after performance of a time-out.  I did culture some of this material. I followed the sinus down to the femur and the portal into the lateral cortex, identifying and removing the antibiotic beads.  These beads came out easily, however, the suture did tear between some of the beads and this required considerable effort thereafter to fish out the remainder using a variety of curettes and pituitary rongeurs.  Ultimately, we were able to track all of the antibiotic beads.  I used a curette to partially excise the surrounding bone in any area,  which was of fibrinous and did not have good integrity.  I  also took a canal brush as well as the revision total joint instruments to help with the partial excision of the femur and to remove the lavage and intramedullary  contents.  Some of this material was sent for culture as well.  It was irrigated thoroughly using high volume low pressure with 9000 mL.  The canal brush did go up and make contact with the tip of the femoral stem from his total hip arthroplasty.  After the thorough irrigation, I then turned attention distally to the knee joint where the capsule was identified and the arthrotomy performed with a 10 blade.  This produced an outflow of viscous fluid that was not grossly purulent but did appear to be inflammatory and consistent with septic arthritis.  The knee was taken through a range of motion  and other 3000 to 6000 mL of fluid flushed through it throughout.  The drapes and attire were changed and then after additional lavage 2 large batches of vancomycin and tobramycin stimulant beads were inserted into the cavity.  A medium Hemovac was placed into the knee joint and brought out medially and then the medium large Hemovac into the supracondylar femur area.  I did stress the knee under fluoro and there did appear to be a motion at the fracture site consistent with nonunion of the supracondylar region.  The wound was closed in a layered fashion using 0 PDS, 2-0 PDS, and 3-0 nylon with some 2-0 nylon as well with retention vertical mattress sutures to help wound edges using recurrent incision.  Montez Morita, PA-C did assist me during the case. Montez Morita, [facility] assisted me during the procedure, assisting with exposure, stabilization of the femur during stress flouro, and wound closure.  PROGNOSIS:  Chad [name] is taken to PACU in stable condition after application of gently compressive dressing.  We will consult Infectious Disease Service and also Dr. Lajoyce Corners, who has been following for the lower extremity wound and do suggest further treatment regarding his femur. It would be expected from the intraoperative findings that there is seating or contamination of his total hip arthroplasty and it does appear to be loose in the pelvic region as well.  Our hope is that we can eliminate or control the infection but certainly long-term permanent suppression may be an option for him.  He is unlikely to regain ambulatory function.  I have discussed this with his sister.     Doralee Albino. Carola Frost, M.D.     MHH/MEDQ  D:  06/13/2014  T:  06/14/2014  Job:  [id number]

## 2014-06-14 NOTE — Progress Notes (Signed)
[facility]opaedic Trauma [facility] (OTS)  Subjective: Feeling well this am with minimal pain or discomfort. Debating the AKA option.  Objective: BP 123/73 mmHg  Pulse 90  Temp(Src) 98.6 F (37 C) (Oral)  Resp 19  Ht 6' (1.829 m)  Wt 240 lb 4.8 oz (109 kg)  BMI 32.58 kg/m2  SpO2 93%  Physical Exam LLE: Slight drainage, serosanguinous.  Edema decreasing. Erythema resolved. Drsg changed.  Assessment/Plan: 1. Rehab has eval'd and approved for admission.   2. ID following. Hatcher placing on Vanc and Ceftaz to be given during dialysis with no need for a PICC x 6 wks 3. NWB with unrestricted ROM 4. Appreciate Dr. Audrie Lia ongoing involvement, management, and recommendations.   Myrene Galas, MD [facility] [phone] (p)

## 2014-06-14 NOTE — Progress Notes (Signed)
CONSULT PROGRESS NOTE  Chad [name] ZOX:[id number] DOB: [birth year] DOA: 06/08/2014 PCP: [name], MD   HPI 59 year old male with a history of nonischemic cardiomyopathy, paroxysmal atrial flutter not on anticoagulation secondary to GI bleed, ESRD (MWF) with failed renal transplant (1996), hepatitis C, and osteomyelitis of the left femur. The patient initially had a distal femur fracture in June 2014 and underwent ORIF at that time. He subsequently developed a wound infection and underwent irrigation and debridement. He had been following infectious disease. On 03/14/2014, the patient had his hardware removed and had placement of antibiotic beads. He was seen in the orthopedic office on 06/07/2014. He was noted to have purulent drainage. He was admitted on 06/08/2014 for repeat irrigation and debridement. Postoperatively he was extubated successfully. Unfortunately, the patient developed hypotension that was refractory to 3 doses of colloid infusion. Critical care was consulted, and the patient was transferred to the ICU where he was placed on vasopressors. It was thought that the patient was possibly septic secondary to his wound infection and osteomyelitis as well as residual effects from his anesthesia. He was initially placed on stress steroids which have been weaned to his baseline dose. He has been since weaned off of his vasopressors and was transferred out of the ICU on 06/10/2014. As a result, internal medicine consultation was obtained for medical management. During this hospitalization, nephrology has been consulted for his dialysis needs. In addition, infectious disease has been following for his antibiotic regimen.   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 Active Problems:   Thrombocytopenia   Acute respiratory failure   Renal transplant  disorder   Cardiomyopathy   Atrial flutter   Ulcer of left lower leg   Chronic systolic CHF (congestive heart failure)   CKD (chronic kidney disease)   Hypertension   History of renal transplant   Osteomyelitis   Hepatitis C   Hyperparathyroidism due to renal insufficiency   Shock   Arterial hypotension   ESRD (end stage renal disease)   ESRD on dialysis   Acute blood loss anemia  Hypotension - Multifactorial including sepsis, post anesthetic effect, and possible adrenal insufficiency given outpatient prednisone - BP normal now - Has been weaned off of vasopressors, out of ICD since 5/14 - continue to hold home meds imdur, amlodipine and hydralazine for now  Osteomyelitis left femur - Status post I&D 06/08/2014, Dr. Carola Frost - Wound care per orthopedics - Antibiotics per infectious disease, on Vancomycin and Elita Quick which are given with HD - wound culture +proteus, Blood cultures remain negative, wbc improving - Patient ultimately plans to go back home after this hospitalization--he does not want to go to skilled nursing facility - ongoing discussions regarding amputation, patient unsure how he wishes to proceed  Chronic systolic CHF/nonischemic cardiomyopathy - Previously had EF as low as 15-20% - Follows Dr. [name] - Most recent echocardiogram 06/13/2013 showed EF 60%, on low dose coreg only due to low bp,  - volume managed by HD - on room air now.   Thrombocytopenia - This has been chronic dating back to November 2013 - likely due to chronic hep C with 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 - stable since admission, now normal  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 - Dialysis per nephrology  -Patient has only resumed hemodialysis within the past month  - AV fistula creation 06/01/2014  - Metabolic bone disease per  nephrology - Dialysis Monday, Wednesday, Friday - Continue chronic home dose of prednisone 5 mg daily  Paroxysmal atrial flutter  - Presently in sinus rhythm   - Not on anticoagulation secondary to GI bleed  - Continue aspirin  - Continue carvedilol  - Continue amiodarone  - FOBT negative this admission.  Anemia of CKD - Patient's baseline hemoglobin has had wide variations but baseline seems to be around 11-12 - Drop in hemoglobin likely dilutional and surgery, now stable - Aranesp per nephrology  Hypertension - on decreased dose carvedilol only at this time as discussed above  - Resume other antihypertensive medication as blood pressure allows   Hyperlipidemia - Continue pravastatin   Code Status: full Family Communication: patient  Disposition Plan: per primary team   Procedures:  HD  I/d left thigh 5/12  Antibiotics:  rocephin  Objective: BP 123/73 mmHg  Pulse 90  Temp(Src) 98.6 F (37 C) (Oral)  Resp 19  Ht 6' (1.829 m)  Wt 109 kg (240 lb 4.8 oz)  BMI 32.58 kg/m2  SpO2 93%  Intake/Output Summary (Last 24 hours) at 06/14/14 1336 Last data filed at 06/14/14 0900  Gross per 24 hour  Intake    480 ml  Output      0 ml  Net    480 ml   Filed Weights   06/08/14 0913 06/12/14 1336  Weight: 106.595 kg (235 lb) 109 kg (240 lb 4.8 oz)   Exam:   General:  NAD  Cardiovascular: RRR  Respiratory: CTABL  Abdomen: Soft/ND/NT, positive BS  Musculoskeletal: post op changes left thigh  Neuro: aaox3  Data Reviewed: Basic Metabolic Panel:  Recent Labs Lab 06/08/14 0948 06/08/14 1908 06/09/14 0429 06/10/14 0428 06/12/14 0500  NA 137 134* 130* 131* 132*  K 3.4* 3.6 3.6 4.2 3.6  CL 97* 96* 94* 96* 96*  CO2 GLUCOSE 77 107* 140* 126* 87  BUN 21* 22* 26* 38* 32*  CREATININE 3.42* 3.78* 4.07* 4.89* 4.31*  CALCIUM 9.0 8.5* 8.3* 8.1* 8.2*  MG  --   --  1.7  --   --   PHOS  --  5.3* 5.3* 6.1* 3.7   Liver Function Tests:  Recent Labs Lab 06/08/14 0948 06/08/14 1908 06/10/14 0428 06/12/14 0500  AST 19  --   --   --   ALT 7*  --   --   --   ALKPHOS 65  --   --   --   BILITOT  0.5  --   --   --   PROT 8.0  --   --   --   ALBUMIN 2.4* 2.9* 2.2* 2.2*   CBC:  Recent Labs Lab 06/08/14 0948 06/08/14 1700 06/08/14 2140 06/09/14 0429 06/10/14 0428 06/12/14 0500  WBC 11.9*  --  16.6* 18.3* 16.8* 14.3*  NEUTROABS 7.2  --   --   --   --   --   HGB 13.9 10.2* 9.8* 8.5* 7.6* 8.4*  HCT 42.5 33.0* 31.6* 26.9* 23.7* 25.9*  MCV 92.0  --  92.7 91.8 90.1 91.8  PLT 140*  --  156 161 130* 173   CBG:  Recent Labs Lab 06/10/14 2151 06/11/14 0630 06/11/14 1157 06/11/14 1625 06/11/14 2207  GLUCAP 115* 88 77 91 107*    Recent Results (from the past 240 hour(s))  Anaerobic culture     Status: None   Collection Time: 06/08/14 11:55 AM  Result Value Ref Range Status  Specimen Description TISSUE THIGH LEFT  Final   Special Requests NONE  Final   Gram Stain   Final    ABUNDANT WBC PRESENT,BOTH PMN AND MONONUCLEAR ABUNDANT GRAM POSITIVE COCCI IN CLUSTERS IN PAIRS Performed at [facility] Gram Stain Report Called to,Read Back By and Verified With: Gram Stain Report Called to,Read Back By and Verified With: Mendel Corning RN 1320 06/08/14 [place] WILSONM Performed at [facility]    Culture   Final    NO ANAEROBES ISOLATED Performed at [facility]    Report Status 06/13/2014 FINAL  Final  Gram stain     Status: None   Collection Time: 06/08/14 11:55 AM  Result Value Ref Range Status   Specimen Description TISSUE THIGH LEFT  Final   Special Requests NONE  Final   Gram Stain   Final    ABUNDANT WBC PRESENT,BOTH PMN AND MONONUCLEAR ABUNDANT GRAM POSITIVE COCCI IN PAIRS IN CLUSTERS Gram Stain Report Called to,Read Back By and Verified With: Mendel Corning RN 13:20 06/08/14 ([email or web address])    Report Status 06/08/2014 FINAL  Final  Tissue culture     Status: None   Collection Time: 06/08/14 11:55 AM  Result Value Ref Range Status   Specimen Description TISSUE THIGH LEFT  Final   Special Requests NONE  Final   Gram Stain   Final    ABUNDANT WBC PRESENT,BOTH  PMN AND MONONUCLEAR ABUNDANT GRAM POSITIVE COCCI IN CLUSTERS IN PAIRS Performed at [facility] Gram Stain Report Called to,Read Back By and Verified With: Gram Stain Report Called to,Read Back By and Verified With: Mendel Corning RN 1320 06/08/14 [place] WILSONM Performed at [facility]    Culture   Final    MODERATE PROTEUS MIRABILIS Performed at [facility]    Report Status 06/11/2014 FINAL  Final   Organism ID, Bacteria PROTEUS MIRABILIS  Final      Susceptibility   Proteus mirabilis - MIC*    AMPICILLIN >=32 RESISTANT Resistant     AMPICILLIN/SULBACTAM >=32 RESISTANT Resistant     CEFAZOLIN >=64 RESISTANT Resistant     CEFEPIME <=1 SENSITIVE Sensitive     CEFTAZIDIME 4 SENSITIVE Sensitive     CEFTRIAXONE 4 SENSITIVE Sensitive     CIPROFLOXACIN <=0.25 SENSITIVE Sensitive     GENTAMICIN <=1 SENSITIVE Sensitive     IMIPENEM 2 SENSITIVE Sensitive     PIP/TAZO <=4 SENSITIVE Sensitive     TOBRAMYCIN <=1 SENSITIVE Sensitive     TRIMETH/SULFA <=20 SENSITIVE Sensitive     * MODERATE PROTEUS MIRABILIS  Anaerobic culture     Status: None   Collection Time: 06/08/14  1:58 PM  Result Value Ref Range Status   Specimen Description TISSUE  Final   Special Requests REAMINGS OF LEFT FEMUR PT ON ANCEF AND ZOSYN  Final   Gram Stain   Final    ABUNDANT WBC PRESENT,BOTH PMN AND MONONUCLEAR NO ORGANISMS SEEN Performed at [facility] Performed at [facility]    Culture   Final    NO ANAEROBES ISOLATED Performed at [facility]    Report Status 06/13/2014 FINAL  Final  Gram stain     Status: None   Collection Time: 06/08/14  1:58 PM  Result Value Ref Range Status   Specimen Description TISSUE  Final   Special Requests REAMINGS OF LEFT FEMUR  Final   Gram Stain   Final    ABUNDANT WBC PRESENT,BOTH PMN AND MONONUCLEAR NO ORGANISMS SEEN  Report Status 06/08/2014 FINAL  Final  Tissue culture     Status: None   Collection Time: 06/08/14  1:58 PM    Result Value Ref Range Status   Specimen Description TISSUE  Final   Special Requests REAMINGS OF LEFT FEMUR  Final   Gram Stain   Final    ABUNDANT WBC PRESENT,BOTH PMN AND MONONUCLEAR NO ORGANISMS SEEN Performed at [facility] Performed at [facility]    Culture   Final    NO GROWTH 3 DAYS Performed at [facility]    Report Status 06/11/2014 FINAL  Final  MRSA PCR Screening     Status: Abnormal   Collection Time: 06/08/14  6:15 PM  Result Value Ref Range Status   MRSA by PCR POSITIVE (A) NEGATIVE Final    Comment:        The GeneXpert MRSA Assay (FDA approved for NASAL specimens only), is one component of a comprehensive MRSA colonization surveillance program. It is not intended to diagnose MRSA infection nor to guide or monitor treatment for MRSA infections. RESULT CALLED TO, READ BACK BY AND VERIFIED WITH: RN,GREG HARDUK [id number] @2121  THANEY   Culture, blood (routine x 2)     Status: None (Preliminary result)   Collection Time: 06/08/14  7:08 PM  Result Value Ref Range Status   Specimen Description BLOOD RIGHT ANTECUBITAL  Final   Special Requests   Final    BOTTLES DRAWN AEROBIC AND ANAEROBIC 5CCBLUE 3CC RED   Culture   Final           BLOOD CULTURE RECEIVED NO GROWTH TO DATE CULTURE WILL BE HELD FOR 5 DAYS BEFORE ISSUING A FINAL NEGATIVE REPORT Note: Culture results may be compromised due to an inadequate volume of blood received in culture bottles. Performed at [facility]    Report Status PENDING  Incomplete  Culture, blood (routine x 2)     Status: None (Preliminary result)   Collection Time: 06/08/14  7:20 PM  Result Value Ref Range Status   Specimen Description BLOOD RIGHT HAND  Final   Special Requests BOTTLES DRAWN AEROBIC ONLY  Final   Culture   Final           BLOOD CULTURE RECEIVED NO GROWTH TO DATE CULTURE WILL BE HELD FOR 5 DAYS BEFORE ISSUING A FINAL NEGATIVE REPORT Performed at [facility]     Report Status PENDING  Incomplete  Urine culture     Status: None   Collection Time: 06/09/14  7:03 AM  Result Value Ref Range Status   Specimen Description URINE, RANDOM  Final   Special Requests NONE  Final   Colony Count NO GROWTH Performed at [facility]   Final   Culture NO GROWTH Performed at [facility]   Final   Report Status 06/10/2014 FINAL  Final     Studies: No results found.  Scheduled Meds: . allopurinol  100 mg Oral BID  . amiodarone  100 mg Oral Daily  . aspirin EC  325 mg Oral Daily  . calcium acetate  667 mg Oral TID WC  . carvedilol  3.125 mg Oral BID WC  . cefTAZidime (FORTAZ)  IV  2 g Intravenous Q M,W,F-HD  . darbepoetin (ARANESP) injection - DIALYSIS  100 mcg Intravenous Q Mon-HD  . docusate sodium  100 mg Oral BID  . doxercalciferol  1 mcg Intravenous Q M,W,F-HD  . heparin subcutaneous  5,000 Units Subcutaneous 3 times  per day  . multivitamin  1 tablet Oral QHS  . pravastatin  40 mg Oral q1800  . predniSONE  5 mg Oral Q breakfast  . vancomycin  2,000 mg Intravenous Q Wed-HD  . [START ON 06/19/2014] vancomycin  1,000 mg Intravenous Q M,W,F-HD    Continuous Infusions: . sodium chloride 10 mL/hr at 06/08/14 1035    Pamella Pert MD, PhD  [name] Pager [phone]. If 7PM-7AM, please contact night-coverage at [email or web address], password [facility] 06/14/2014, 1:36 PM  LOS: 6 days

## 2014-06-14 NOTE — Clinical Social Work Note (Signed)
Clinical Social Work Assessment  Patient Details  Name: Chad [name] MRN: [id number] Date of Birth: September 11, 1955  Date of referral:  06/14/14               Reason for consult:  Facility Placement, Discharge Planning                Permission sought to share information with:   (Patient alert and oriented and did not request CSW to share information.) Permission granted to share information::  No  Name::        Agency::     Relationship::     Contact Information:     Housing/Transportation Living arrangements for the past 2 months:  Single Family Home Source of Information:  Patient Patient Interpreter Needed:  None Criminal Activity/Legal Involvement Pertinent to Current Situation/Hospitalization:  No - Comment as needed Significant Relationships:  Spouse Lives with:  Spouse Do you feel safe going back to the place where you live?  No (High fall risk.) Need for family participation in patient care:  No (Coment)  Care giving concerns:  Patient requesting CIR placement at time of discharge. Patient agrees to consider SNF placement versus home health.    Social Worker assessment / plan:  CSW received referral PT recommending CIR versus home health. Per medical records, patient has previously completed short-term rehabilitation at [facility]. CSW spoke with patient regarding discharge plan. Patient expressed hopefulness for CIR, but is agreeable to CSW initiating a new SNF search. Patient also expressed patient is open to returning home with home health. CSW to continue to follow and assist with discharge planning needs.  Employment status:  Retired Database administrator PT Recommendations:  Skilled Nursing Facility Information / Referral to community resources:  Skilled Nursing Facility  Patient/Family's Response to care:  Patient understanding and agreeable to CSW plan of care.  Patient/Family's Understanding of and Emotional Response to  Diagnosis, Current Treatment, and Prognosis:  Patient understanding and agreeable to CSW plan of care.  Emotional Assessment Appearance:  Appears older than stated age Attitude/Demeanor/Rapport:  Apprehensive Affect (typically observed):  Flat, Quiet, Calm, Withdrawn Orientation:  Oriented to Self, Oriented to Place, Oriented to  Time, Oriented to Situation Alcohol / Substance use:  Not Applicable Psych involvement (Current and /or in the community):  No (Comment) (Not appropriate on this admission.)  Discharge Needs  Concerns to be addressed:  Discharge Planning Concerns (Patient requesting patient at CIR, but open to SNF versus home health.) Readmission within the last 30 days:  No Current discharge risk:  None Barriers to Discharge:  No Barriers Identified   Rod Mae, LCSW 06/14/2014, 12:14 PM [phone]

## 2014-06-15 LAB — CULTURE, BLOOD (ROUTINE X 2)
CULTURE: NO GROWTH
CULTURE: NO GROWTH

## 2014-06-15 NOTE — Clinical Social Work Placement (Signed)
   CLINICAL SOCIAL WORK PLACEMENT  NOTE  Date:  06/15/2014  Patient Details  Name: Chad [name] MRN: [id number] Date of Birth: [birth year]  Clinical Social Work is seeking post-discharge placement for this patient at the Skilled  Nursing Facility level of care (*CSW will initial, date and re-position this form in  chart as items are completed):  Yes   Patient/family provided with [facility] [place] Clinical Social Work Department's list of facilities offering this level of care within the geographic area requested by the patient (or if unable, by the patient's family).  Yes   Patient/family informed of their freedom to choose among providers that offer the needed level of care, that participate in Medicare, Medicaid or managed care program needed by the patient, have an available bed and are willing to accept the patient.  Yes   Patient/family informed of [facility]'s ownership interest in [facility] and [facility], as well as of the fact that they are under no obligation to receive care at these facilities.  PASRR submitted to EDS on       PASRR number received on       Existing PASRR number confirmed on 06/15/14     FL2 transmitted to all facilities in geographic area requested by pt/family on 06/15/14     FL2 transmitted to all facilities within larger geographic area on       Patient informed that his/her managed care company has contracts with or will negotiate with certain facilities, including the following:            Patient/family informed of bed offers received.  Patient chooses bed at       Physician recommends and patient chooses bed at      Patient to be transferred to   on  .  Patient to be transferred to facility by       Patient family notified on   of transfer.  Name of family member notified:        PHYSICIAN       Additional Comment:    _______________________________________________ Rod Mae, LCSW 06/15/2014, 12:27 PM [phone]

## 2014-06-15 NOTE — Progress Notes (Signed)
Subjective:   No complaints. Hoping to go to rehab  Objective Filed Vitals:   06/14/14 1750 06/14/14 1915 06/14/14 2104 06/15/14 0656  BP: 106/77 104/67 92/55 128/87  Pulse: 97 87 84 86  Temp: 98.8 F (37.1 C) 99 F (37.2 C) 99.5 F (37.5 C) 98.8 F (37.1 C)  TempSrc: Oral  Oral   Resp: 20 20 19 20   Height:      Weight:      SpO2:  97% 97% 97%   Physical Exam General: alert and oriented, no acute distress.  Heart: RRR  Lungs: CTA, unlabored.  Abdomen: soft, nontender +BS  Extremities: bilat legs wrapped. No obvious edema  Dialysis Access: R IJ cath. L AVF +b/t   Dialysis Orders: [facility] 4hr 30 mins 103.5kgs 2K/2.5ca 5000u heparin Hectorol 1   Assessment: 1. L distal femur osteomyelitis - sp I&D w new resorbable beads placed, wound culture + for Proteus, on vanc and fortaz- Dr Lajoyce Corners recommends [facility]- no decision yet 2. Septic shock - resolved, off pressors, moved from ICU 5/14. 3. ESRD - HD on MWF  HD pending tomorrow- get labs tomorrow 4. Hypotension/volume - BP 128/87, Carvedilol decreased to 3.125 mg bid, still above edw  5. Anemia - Hgb 8.4- up from 7.6, started Aranesp 100 mcg 5/16 6. Sec HPT - Ca 8.2 (9.6 corrected), P 3.7; Hectorol 1 mcg, Phoslo 1 with meals. 7. Nutrition - Alb 2.2, renal diet, vitamin. 8. Dispo- eval for inpt rehab pending  Jetty Duhamel, NP [facility] Beeper [phone] 06/15/2014,10:00 AM  LOS: 7 days   Pt seen, examined and agree w A/P as above.  Vinson Moselle MD pager [phone]    cell [phone] 06/15/2014, 11:08 AM     Additional Objective Labs: Basic Metabolic Panel:  Recent Labs Lab 06/09/14 0429 06/10/14 0428 06/12/14 0500  NA 130* 131* 132*  K 3.6 4.2 3.6  CL 94* 96* 96*  CO2 24 25 24   GLUCOSE 140* 126* 87  BUN 26* 38* 32*  CREATININE 4.07* 4.89* 4.31*  CALCIUM 8.3* 8.1* 8.2*  PHOS 5.3* 6.1* 3.7   Liver Function Tests:  Recent Labs Lab 06/08/14 1908 06/10/14 0428  06/12/14 0500  ALBUMIN 2.9* 2.2* 2.2*   No results for input(s): LIPASE, AMYLASE in the last 168 hours. CBC:  Recent Labs Lab 06/08/14 2140 06/09/14 0429 06/10/14 0428 06/12/14 0500  WBC 16.6* 18.3* 16.8* 14.3*  HGB 9.8* 8.5* 7.6* 8.4*  HCT 31.6* 26.9* 23.7* 25.9*  MCV 92.7 91.8 90.1 91.8  PLT 156 161 130* 173   Blood Culture    Component Value Date/Time   SDES URINE, RANDOM 06/09/2014 0703   SPECREQUEST NONE 06/09/2014 0703   CULT NO GROWTH Performed at [facility]  06/09/2014 0703   REPTSTATUS 06/10/2014 FINAL 06/09/2014 0703    Cardiac Enzymes: No results for input(s): CKTOTAL, CKMB, CKMBINDEX, TROPONINI in the last 168 hours. CBG:  Recent Labs Lab 06/10/14 2151 06/11/14 0630 06/11/14 1157 06/11/14 1625 06/11/14 2207  GLUCAP 115* 88 77 91 107*   Iron Studies: No results for input(s): IRON, TIBC, TRANSFERRIN, FERRITIN in the last 72 hours. @lablastinr3 @ Studies/Results: No results found. Medications: . sodium chloride 10 mL/hr at 06/08/14 1035   . allopurinol  100 mg Oral BID  . amiodarone  100 mg Oral Daily  . aspirin EC  325 mg Oral Daily  . calcium acetate  667 mg Oral TID WC  . carvedilol  3.125 mg Oral BID WC  . cefTAZidime (FORTAZ)  IV  2 g Intravenous Q M,W,F-HD  . darbepoetin (ARANESP) injection - DIALYSIS  100 mcg Intravenous Q Mon-HD  . docusate sodium  100 mg Oral BID  . doxercalciferol  1 mcg Intravenous Q M,W,F-HD  . heparin subcutaneous  5,000 Units Subcutaneous 3 times per day  . multivitamin  1 tablet Oral QHS  . pravastatin  40 mg Oral q1800  . predniSONE  5 mg Oral Q breakfast  . [START ON 06/19/2014] vancomycin  1,000 mg Intravenous Q M,W,F-HD

## 2014-06-15 NOTE — Clinical Social Work Note (Signed)
CSW spoke with patient regarding SNF bed offers. Patient has expressed interest in [facility]. Per patient, patient's friend to visit [facility] before patient makes final decision regarding discharge disposition. Patient agreeable to [facility] admissions liaison visiting patient at bedside.  CSW to continue to follow and assist with discharge planning needs.  Marcelline Deist, [place] - [phone] Clinical Social Work Department Orthopedics [phone]) and Surgical [phone])

## 2014-06-15 NOTE — Progress Notes (Signed)
Physical Therapy Treatment Patient Details Name: Chad [name] MRN: [id number] DOB: [birth year] Today's Date: 06/15/2014    History of Present Illness Pt is a 59 y/o male with a PMH of CKD s/p renal transplant, cardiomyopathy, CHF, Hep C. 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 Pt with previous IM nailing, and hardware was removed and replaced with abx beads in 02/2014 to try and address this. Pt is now s/p I&D of his L thigh/distal femur.     PT Comments    Patient is making progress with PT and remains good candidate for CIR as pt is highly motivated and is showing progress.  Pt's endurance improved this session and was able to stand for 30 seconds EOB before returning to sitting 2/2 fatigue of RLE.  Pt will benefit from continued skilled PT services to increase functional independence and safety.   Follow Up Recommendations  CIR;Supervision for mobility/OOB;Home health PT     Equipment Recommendations  None recommended by PT    Recommendations for Other Services Rehab consult     Precautions / Restrictions Precautions Precautions: Fall Precaution Comments: Unna boot on RLE - pt has wounds there as well Required Braces or Orthoses: Other Brace/Splint Other Brace/Splint: PRAFO LLE Restrictions Weight Bearing Restrictions: Yes LLE Weight Bearing: Non weight bearing    Mobility  Bed Mobility Overal bed mobility: Needs Assistance Bed Mobility: Supine to Sit     Supine to sit: Min assist     General bed mobility comments: Min assist w/ managing LLE to sitting EOB.  Mod use of bed rails, increased time  Transfers Overall transfer level: Needs assistance Equipment used: Rolling walker (2 wheeled);Pushed w/c Transfers: Sit to/from Stand;Lateral/Scoot Transfers Sit to Stand: Min assist;+2 physical assistance;From elevated surface        Lateral/Scoot Transfers: Min assist;With slide board General transfer comment: Min assist w/ managing LLE  during sliding board transfer to [facility] to adhere to WB precautions.  Min assist for sit<>stand to power up to sitting and to return to sitting position in controlled manner.  Pt w/ good carryover for technique from previous sessions.  Ambulation/Gait                 Stairs            Wheelchair Mobility    Modified Rankin (Stroke Patients Only)       Balance Overall balance assessment: Needs assistance Sitting-balance support: Bilateral upper extremity supported;Feet supported Sitting balance-Leahy Scale: Fair     Standing balance support: Bilateral upper extremity supported;During functional activity Standing balance-Leahy Scale: Fair                      Cognition Arousal/Alertness: Awake/alert Behavior During Therapy: WFL for tasks assessed/performed Overall Cognitive Status: Within Functional Limits for tasks assessed                      Exercises Total Joint Exercises Ankle Circles/Pumps: AROM;Both;10 reps;Seated;Supine Long Arc Quad: AROM;Both;10 reps;Seated    General Comments General comments (skin integrity, edema, etc.): Pt is unable to perform R heel raise in standing and compensates w/ knee flexion to achieve heel raise.      Pertinent Vitals/Pain Pain Assessment: 0-10 Pain Score: 3  Pain Location: LLE Pain Descriptors / Indicators: Aching;Grimacing Pain Intervention(s): Limited activity within patient's tolerance;Monitored during session;Repositioned    Home Living  Prior Function            PT Goals (current goals can now be found in the care plan section) Acute Rehab PT Goals Patient Stated Goal: to go to rehab Progress towards PT goals: Progressing toward goals    Frequency  Min 3X/week    PT Plan Current plan remains appropriate    Co-evaluation             End of Session Equipment Utilized During Treatment: Gait belt;Other (comment) (L PRAFO) Activity Tolerance: Patient  limited by fatigue;Patient tolerated treatment well Patient left: with call bell/phone within reach (in [facility])     Time: 4462-8638 PT Time Calculation (min) (ACUTE ONLY): 21 min  Charges:  $Therapeutic Activity: 8-22 mins                    G Codes:      Michail Jewels PT, DPT [phone] Pager: [phone] 06/15/2014, 5:01 PM

## 2014-06-15 NOTE — Care Management Note (Signed)
Case Management Note  Patient Details  Name: BARAN KLEMZ MRN: [id number] Date of Birth: Nov 09, 1955  Subjective/Objective:    Patient admitted with a left femur nonunion and infection. Patient declining AKA at this time.    Action/Plan:    Case manager spoke with patient concerning discharge plans. Social worker has spoken with him regarding possible bed offers. If patient decides to go home, he states he will  Have family assistance. He uses [facility]. Has a wheelchair. Case Manager will continue to monitor.   Expected Discharge Date:                  Expected Discharge Plan:  Home w Home Health Services  In-House Referral:     Discharge planning Services  CM Consult  Post Acute Care Choice:    Choice offered to:     DME Arranged:    DME Agency:     HH Arranged:    HH Agency:  [facility]  Status of Service:  In process, will continue to follow  Medicare Important Message Given:    Date Medicare IM Given:    Medicare IM give by:    Date Additional Medicare IM Given:    Additional Medicare Important Message give by:     If discussed at Long Length of Stay Meetings, dates discussed:    Additional Comments:  Durenda Guthrie, RN 06/15/2014, 3:41 PM

## 2014-06-15 NOTE — Progress Notes (Signed)
CONSULT PROGRESS NOTE  ROSSER COLLINGTON ZOX:[id number] DOB: 20-Jun-1955 DOA: 06/08/2014 PCP: [name], MD   24 hours: - denies chest pain, abdominal pain, nausea/vomiting, no SOB - he tells me today that he wishes not to have surgery but will discuss with Dr. 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 Active Problems:   Thrombocytopenia   Acute respiratory failure   Renal transplant disorder   Cardiomyopathy   Atrial flutter   Ulcer of left lower leg   Chronic systolic CHF (congestive heart failure)   CKD (chronic kidney disease)   Hypertension   History of renal transplant   Osteomyelitis   Hepatitis C   Hyperparathyroidism due to renal insufficiency   Shock   Arterial hypotension   ESRD (end stage renal disease)   ESRD on dialysis   Acute blood loss anemia  Hypotension - Multifactorial including sepsis, post anesthetic effect, and possible adrenal insufficiency given outpatient prednisone - BP normal now - Has been weaned off of vasopressors, out of ICD since 5/14 - continue to hold home meds imdur, amlodipine and hydralazine;  - this is stable, would be OK to discharge without amlodipine and hydralazine for now, these can be restarted as an outpatient  Osteomyelitis left femur - Status post I&D 06/08/2014, Dr. Carola Frost - Wound care per orthopedics - Antibiotics per infectious disease, on Vancomycin and Elita Quick which are given with HD - wound culture +proteus, Blood cultures remain negative, wbc improving - Patient ultimately plans to go back home after this hospitalization--he does not want to go to skilled nursing facility - placement pending, antibiotics per ID, with HD, no need for PICC line; patient tells me today he does not want the surgery; Insurance denied CIR   Chronic systolic CHF/nonischemic cardiomyopathy - Previously had EF as low as 15-20% - Follows Dr. [name] - Most recent  echocardiogram 06/13/2013 showed EF 60%, on low dose coreg only due to low bp,  - volume managed by HD - on room air now.  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 - Currently in sinus rhythm   Anemia of CKD - Patient's baseline hemoglobin has had wide variations but baseline seems to be around 11-12 - Drop in hemoglobin likely dilutional and surgery, now stable - Aranesp per nephrology  Hyperlipidemia - Continue pravastatin    If the patient wishes to undergo surgery Medicine will continue to follow, otherwise his medical problems are stable, and from this standpoint he is ok for discharge to SNF per primary service.    Code Status: full Family Communication: patient  Disposition Plan: per primary team    HPI 59 year old male with a history of nonischemic cardiomyopathy, paroxysmal atrial flutter not on anticoagulation secondary to GI bleed,  ESRD (MWF) with failed renal transplant (1996), hepatitis C, and osteomyelitis of the left femur. The patient initially had a distal femur fracture in June 2014 and underwent ORIF at that time. He subsequently developed a wound infection and underwent irrigation and debridement. He had been following infectious disease. On  03/14/2014, the patient had his hardware removed and had placement of antibiotic beads. He was seen in the orthopedic office on 06/07/2014. He was noted to have purulent drainage. He was admitted on 06/08/2014 for repeat irrigation and debridement. Postoperatively he was extubated successfully. Unfortunately, the patient developed hypotension that was refractory to 3 doses of colloid infusion. Critical care was consulted, and the patient was transferred to the ICU where he was placed on vasopressors. It was thought that the patient was possibly septic secondary to his wound infection and osteomyelitis as well as residual effects from his anesthesia. He was initially placed on stress steroids which have been weaned to his baseline dose. He has been since weaned off of his vasopressors and was transferred out of the ICU on 06/10/2014. As a result, internal medicine consultation was obtained for medical management. During this hospitalization, nephrology has been consulted for his dialysis needs. In addition, infectious disease has been following for his antibiotic regimen.   Procedures:  HD  I/d left thigh 5/12  Antibiotics:  Rocephin  Vancomycin  Objective: BP 128/87 mmHg  Pulse 86  Temp(Src) 98.8 F (37.1 C) (Oral)  Resp 20  Ht 6' (1.829 m)  Wt 108.4 kg (238 lb 15.7 oz)  BMI 32.40 kg/m2  SpO2 97%  Intake/Output Summary (Last 24 hours) at 06/15/14 1200 Last data filed at 06/15/14 0842  Gross per 24 hour  Intake    394 ml  Output   3100 ml  Net  -2706 ml   Filed Weights   06/08/14 0913 06/12/14 1336 06/14/14 1332  Weight: 106.595 kg (235 lb) 109 kg (240 lb 4.8 oz) 108.4 kg (238 lb 15.7 oz)   Exam:  General:  NAD  Cardiovascular: RRR  Respiratory: CTABL  Abdomen: Soft/ND/NT, positive BS  Musculoskeletal: post op changes left thigh  Neuro: aaox3  Data Reviewed: Basic Metabolic Panel:  Recent Labs Lab 06/08/14 1908 06/09/14 0429 06/10/14 0428 06/12/14 0500  NA  134* 130* 131* 132*  K 3.6 3.6 4.2 3.6  CL 96* 94* 96* 96*  CO2 GLUCOSE 107* 140* 126* 87  BUN 22* 26* 38* 32*  CREATININE 3.78* 4.07* 4.89* 4.31*  CALCIUM 8.5* 8.3* 8.1* 8.2*  MG  --  1.7  --   --   PHOS 5.3* 5.3* 6.1* 3.7   Liver Function Tests:  Recent Labs Lab 06/08/14 1908 06/10/14 0428 06/12/14 0500  ALBUMIN 2.9* 2.2* 2.2*   CBC:  Recent Labs Lab 06/08/14 1700 06/08/14 2140 06/09/14 0429 06/10/14 0428 06/12/14 0500  WBC  --  16.6* 18.3* 16.8* 14.3*  HGB 10.2* 9.8* 8.5* 7.6* 8.4*  HCT 33.0* 31.6* 26.9* 23.7* 25.9*  MCV  --  92.7 91.8 90.1 91.8  PLT  --  156 161 130* 173   CBG:  Recent Labs Lab 06/10/14 2151 06/11/14 0630 06/11/14 1157 06/11/14 1625 06/11/14 2207  GLUCAP 115* 88 77 91 107*    Recent Results (from the past 240 hour(s))  Anaerobic culture     Status: None   Collection Time: 06/08/14 11:55 AM  Result Value Ref Range Status   Specimen Description TISSUE THIGH LEFT  Final   Special Requests NONE  Final   Gram Stain   Final    ABUNDANT WBC PRESENT,BOTH PMN AND MONONUCLEAR ABUNDANT GRAM POSITIVE COCCI IN CLUSTERS IN PAIRS Performed at [facility] Gram Stain Report Called to,Read Back By and Verified With: Gram Stain Report Called to,Read Back By and Verified With: Mendel Corning RN 1320 06/08/14 [place] WILSONM Performed at [facility]    Culture   Final    NO ANAEROBES ISOLATED Performed at [facility]    Report Status 06/13/2014 FINAL  Final  Gram stain     Status: None   Collection Time: 06/08/14 11:55 AM  Result Value Ref Range Status   Specimen Description TISSUE THIGH LEFT  Final   Special Requests NONE  Final   Gram Stain   Final    ABUNDANT WBC PRESENT,BOTH PMN AND MONONUCLEAR ABUNDANT GRAM POSITIVE COCCI IN PAIRS IN CLUSTERS Gram Stain Report Called to,Read Back By and Verified With: Mendel Corning RN 13:20 06/08/14 ([email or web address])    Report Status 06/08/2014 FINAL  Final  Tissue culture     Status:  None   Collection Time: 06/08/14 11:55 AM  Result Value Ref Range Status   Specimen Description TISSUE THIGH LEFT  Final   Special Requests NONE  Final   Gram Stain   Final    ABUNDANT WBC PRESENT,BOTH PMN AND MONONUCLEAR ABUNDANT GRAM POSITIVE COCCI IN CLUSTERS IN PAIRS Performed at [facility] Gram Stain Report Called to,Read Back By and Verified With: Gram Stain Report Called to,Read Back By and Verified With: Mendel Corning RN 1320 06/08/14 [place] WILSONM Performed at [facility]    Culture   Final    MODERATE PROTEUS MIRABILIS Performed at [facility]    Report Status 06/11/2014 FINAL  Final   Organism ID, Bacteria PROTEUS MIRABILIS  Final      Susceptibility   Proteus mirabilis - MIC*    AMPICILLIN >=32 RESISTANT Resistant     AMPICILLIN/SULBACTAM >=32 RESISTANT Resistant     CEFAZOLIN >=64 RESISTANT Resistant     CEFEPIME <=1 SENSITIVE Sensitive     CEFTAZIDIME 4 SENSITIVE Sensitive     CEFTRIAXONE 4 SENSITIVE Sensitive     CIPROFLOXACIN <=0.25 SENSITIVE Sensitive     GENTAMICIN <=1 SENSITIVE Sensitive     IMIPENEM 2 SENSITIVE Sensitive     PIP/TAZO <=4 SENSITIVE Sensitive     TOBRAMYCIN <=1 SENSITIVE Sensitive     TRIMETH/SULFA <=20 SENSITIVE Sensitive     * MODERATE PROTEUS MIRABILIS  Anaerobic culture     Status: None   Collection Time: 06/08/14  1:58 PM  Result Value Ref Range Status   Specimen Description TISSUE  Final   Special Requests REAMINGS OF LEFT FEMUR PT ON ANCEF AND ZOSYN  Final   Gram Stain   Final    ABUNDANT WBC PRESENT,BOTH PMN AND MONONUCLEAR NO ORGANISMS SEEN Performed at [facility] Performed at [facility]    Culture   Final    NO ANAEROBES ISOLATED Performed at [facility]    Report Status 06/13/2014 FINAL  Final  Gram stain     Status: None   Collection Time: 06/08/14  1:58 PM  Result Value Ref Range Status   Specimen Description TISSUE  Final   Special Requests REAMINGS OF LEFT FEMUR  Final     Gram Stain   Final    ABUNDANT WBC PRESENT,BOTH PMN AND MONONUCLEAR NO ORGANISMS SEEN    Report Status 06/08/2014 FINAL  Final  Tissue  culture     Status: None   Collection Time: 06/08/14  1:58 PM  Result Value Ref Range Status   Specimen Description TISSUE  Final   Special Requests REAMINGS OF LEFT FEMUR  Final   Gram Stain   Final    ABUNDANT WBC PRESENT,BOTH PMN AND MONONUCLEAR NO ORGANISMS SEEN Performed at [facility] Performed at [facility]    Culture   Final    NO GROWTH 3 DAYS Performed at [facility]    Report Status 06/11/2014 FINAL  Final  MRSA PCR Screening     Status: Abnormal   Collection Time: 06/08/14  6:15 PM  Result Value Ref Range Status   MRSA by PCR POSITIVE (A) NEGATIVE Final    Comment:        The GeneXpert MRSA Assay (FDA approved for NASAL specimens only), is one component of a comprehensive MRSA colonization surveillance program. It is not intended to diagnose MRSA infection nor to guide or monitor treatment for MRSA infections. RESULT CALLED TO, READ BACK BY AND VERIFIED WITH: RN,GREG HARDUK [id number] @2121  THANEY   Culture, blood (routine x 2)     Status: None   Collection Time: 06/08/14  7:08 PM  Result Value Ref Range Status   Specimen Description BLOOD RIGHT ANTECUBITAL  Final   Special Requests   Final    BOTTLES DRAWN AEROBIC AND ANAEROBIC 5CCBLUE 3CC RED   Culture   Final    NO GROWTH 5 DAYS Note: Culture results may be compromised due to an inadequate volume of blood received in culture bottles. Performed at [facility]    Report Status 06/15/2014 FINAL  Final  Culture, blood (routine x 2)     Status: None   Collection Time: 06/08/14  7:20 PM  Result Value Ref Range Status   Specimen Description BLOOD RIGHT HAND  Final   Special Requests BOTTLES DRAWN AEROBIC ONLY  Final   Culture   Final    NO GROWTH 5 DAYS Performed at [facility]    Report Status 06/15/2014 FINAL  Final   Urine culture     Status: None   Collection Time: 06/09/14  7:03 AM  Result Value Ref Range Status   Specimen Description URINE, RANDOM  Final   Special Requests NONE  Final   Colony Count NO GROWTH Performed at [facility]   Final   Culture NO GROWTH Performed at [facility]   Final   Report Status 06/10/2014 FINAL  Final     Studies: No results found.  Scheduled Meds: . allopurinol  100 mg Oral BID  . amiodarone  100 mg Oral Daily  . aspirin EC  325 mg Oral Daily  . calcium acetate  667 mg Oral TID WC  . carvedilol  3.125 mg Oral BID WC  . cefTAZidime (FORTAZ)  IV  2 g Intravenous Q M,W,F-HD  . darbepoetin (ARANESP) injection - DIALYSIS  100 mcg Intravenous Q Mon-HD  . docusate sodium  100 mg Oral BID  . doxercalciferol  1 mcg Intravenous Q M,W,F-HD  . heparin subcutaneous  5,000 Units Subcutaneous 3 times per day  . multivitamin  1 tablet Oral QHS  . pravastatin  40 mg Oral q1800  . predniSONE  5 mg Oral Q breakfast  . [START ON 06/19/2014] vancomycin  1,000 mg Intravenous Q M,W,F-HD    Continuous Infusions: . sodium chloride 10 mL/hr at 06/08/14 1035    Pamella Pert MD  [name] Pager [phone]. If 7PM-7AM, please contact night-coverage at [email or web address], password [facility] 06/15/2014, 12:00 PM  LOS: 7 days

## 2014-06-15 NOTE — Progress Notes (Signed)
Orthopaedic Trauma Service Progress Note  Subjective  Doing well Feeling better Considering snf Will likely go tomorrow   Review of Systems  Constitutional: Negative for fever and chills.  Respiratory: Negative for shortness of breath.   Cardiovascular: Negative for chest pain and palpitations.  Gastrointestinal: Negative for nausea and vomiting.     Objective   BP 118/75 mmHg  Pulse 93  Temp(Src) 99 F (37.2 C) (Oral)  Resp 18  Ht 6' (1.829 m)  Wt 108.4 kg (238 lb 15.7 oz)  BMI 32.40 kg/m2  SpO2 93%  Intake/Output      05/18 0701 - 05/19 0700 05/19 0701 - 05/20 0700   P.O. 614 480   I.V. (mL/kg)  20 (0.2)   IV Piggyback     Total Intake(mL/kg) 614 (5.7) 500 (4.6)   Urine (mL/kg/hr) 100 (0)    Other 3000 (1.2)    Total Output 3100     Net -2486 +500          Exam  Gen: resting comfortably in wheelchair, eating dinner Abd: soft NTND, +BS Ext:        Left Lower Extremity                            Dressing changed  Wound stable             motor and sensory functions at baseline               L leg stable    Assessment and Plan   POD/HD#:7   59 y/o male s/p I&D for persistent L distal femur osteo  1. L distal femur osteomyelitis and nonunion             New resorbable abx beads placed             No hardware implanted             Nonunion present             NWB L leg             abx per ID  2. Shock             Likely multifactorial             resolved               Continue per IM             Only on coreg for HTN at this time, amlodipine and hydralazine on hold   3. ESRD on dialysis             Dialysis tomorrow             Appreciate Renals assistance   4. ID             culture positive for proteus mirabilis and abundand GPC (office cx shows MRSA)             vanc and ceftazidime with HD               Appreciate ID assistance   5. Medical issues             per hospitalist             Appreciate their assistance with this  highly complex pt   6. Acute on chronic anemia             stable  7. Dispo             Dc to snf or home with [facility] tomorrow after dialysis   Mearl Latin, PA-C [facility] [phone] (O) 06/15/2014 5:14 PM

## 2014-06-15 NOTE — Progress Notes (Signed)
Rehab admissions - I have a denial for acute inpatient rehab admission from [facility] carrier.  Options likely to be SNF or HH therapies for follow up.  Call me for questions.  #[phone]

## 2014-06-15 NOTE — Progress Notes (Signed)
06/15/14  Case manager spoke with patient concerning discharge plans. Social worker has spoken with him regarding possible bed offers. If patient decides to go home, he states he will  Have family assistance. He uses [facility]. Has a wheelchair. Case Manager will continue to monitor. Vance Peper, RN BSN Case Manager [phone]

## 2014-06-16 ENCOUNTER — Inpatient Hospital Stay (HOSPITAL_COMMUNITY): Payer: Medicare HMO

## 2014-06-16 DIAGNOSIS — R1084 Generalized abdominal pain: Secondary | ICD-10-CM

## 2014-06-16 LAB — CBC
HCT: 28.1 % — ABNORMAL LOW (ref 39.0–52.0)
Hemoglobin: 8.6 g/dL — ABNORMAL LOW (ref 13.0–17.0)
MCH: 29.2 pg (ref 26.0–34.0)
MCHC: 30.6 g/dL (ref 30.0–36.0)
MCV: 95.3 fL (ref 78.0–100.0)
PLATELETS: 184 10*3/uL (ref 150–400)
RBC: 2.95 MIL/uL — ABNORMAL LOW (ref 4.22–5.81)
RDW: 19.4 % — ABNORMAL HIGH (ref 11.5–15.5)
WBC: 13.2 10*3/uL — AB (ref 4.0–10.5)

## 2014-06-16 LAB — RENAL FUNCTION PANEL
ALBUMIN: 2.1 g/dL — AB (ref 3.5–5.0)
ANION GAP: 9 (ref 5–15)
BUN: 14 mg/dL (ref 6–20)
CHLORIDE: 97 mmol/L — AB (ref 101–111)
CO2: 26 mmol/L (ref 22–32)
Calcium: 8.9 mg/dL (ref 8.9–10.3)
Creatinine, Ser: 3.74 mg/dL — ABNORMAL HIGH (ref 0.61–1.24)
GFR calc Af Amer: 19 mL/min — ABNORMAL LOW (ref >60)
GFR, EST NON AFRICAN AMERICAN: 17 mL/min — AB (ref >60)
Glucose, Bld: 82 mg/dL (ref 65–99)
POTASSIUM: 3.8 mmol/L (ref 3.5–5.1)
Phosphorus: 3.6 mg/dL (ref 2.5–4.6)
SODIUM: 132 mmol/L — AB (ref 135–145)

## 2014-06-16 LAB — IRON AND TIBC
Iron: 45 ug/dL (ref 45–182)
SATURATION RATIOS: 26 % (ref 17.9–39.5)
TIBC: 172 ug/dL — ABNORMAL LOW (ref 250–450)
UIBC: 127 ug/dL

## 2014-06-16 MED ORDER — TRAMADOL HCL 50 MG PO TABS: 50.0000 mg | ORAL_TABLET | Freq: Four times a day (QID) | ORAL | Status: DC | PRN

## 2014-06-16 MED ORDER — PREDNISONE 5 MG PO TABS: 5.0000 mg | ORAL_TABLET | Freq: Every day | ORAL | Status: DC

## 2014-06-16 MED ORDER — CARVEDILOL 3.125 MG PO TABS: 3.1250 mg | ORAL_TABLET | Freq: Two times a day (BID) | ORAL | Status: DC

## 2014-06-16 MED ORDER — DOXERCALCIFEROL 4 MCG/2ML IV SOLN
1.0000 ug | INTRAVENOUS | Status: DC
Start: 2014-06-16 — End: 2016-02-20

## 2014-06-16 MED ORDER — DOXERCALCIFEROL 4 MCG/2ML IV SOLN
INTRAVENOUS | Status: AC
  Administered 2014-06-16: 1 ug via INTRAVENOUS
  Filled 2014-06-16: qty 2

## 2014-06-16 MED ORDER — RENA-VITE PO TABS: 1.0000 | ORAL_TABLET | Freq: Every day | ORAL | Status: DC

## 2014-06-16 MED ORDER — CALCIUM CARBONATE 1250 MG/5ML PO SUSP
1500.0000 mg | Freq: Four times a day (QID) | ORAL | Status: DC | PRN
  Administered 2014-06-16 – 2014-06-19 (×3): 1500 mg via ORAL
  Filled 2014-06-16 (×4): qty 15

## 2014-06-16 MED ORDER — DEXTROSE 5 % IV SOLN: 2.0000 g | INTRAVENOUS | Status: DC

## 2014-06-16 MED ORDER — VANCOMYCIN HCL IN DEXTROSE 1-5 GM/200ML-% IV SOLN
1000.0000 mg | INTRAVENOUS | Status: DC
  Administered 2014-06-16 – 2014-06-19 (×2): 1000 mg via INTRAVENOUS
  Filled 2014-06-16 (×3): qty 200

## 2014-06-16 MED ORDER — VANCOMYCIN HCL IN DEXTROSE 1-5 GM/200ML-% IV SOLN: 1000.0000 mg | INTRAVENOUS | Status: DC

## 2014-06-16 MED ORDER — OXYCODONE HCL 5 MG PO TABS: 5.0000 mg | ORAL_TABLET | ORAL | Status: DC | PRN

## 2014-06-16 NOTE — Clinical Social Work Note (Signed)
Patient has accepted SNF bed at [facility]. Patient will have bed available at SNF on Monday.  Per MD and [facility], discharge home is an unsafe discharge for patient.  Patient refusing SNF placement with other facilities in [facility]. Patient has previously admitted to [facility] and is adamantly stating patient will NOT return to [facility]. Patient is a new dialysis patient, patient would have to switch dialysis centers to different county if CSW were to pursue out-of-county placement (also delaying discharge). CSW to continue to follow and assist with discharge planning needs.  Marcelline Deist, [place] - [phone] Clinical Social Work Department Orthopedics [phone]) and Surgical [phone])

## 2014-06-16 NOTE — Progress Notes (Signed)
[facility] Progress Note  Subjective  Just returned from HD Some GI upset Family not had a chance to look at snf yet but are scheduled to do so shortly   Review of Systems  Constitutional: Negative for fever and chills.  Respiratory: Negative for shortness of breath and wheezing.   Cardiovascular: Negative for chest pain and palpitations.  Gastrointestinal: Negative for nausea and vomiting.  Neurological: Negative for headaches.     Objective   BP 104/71 mmHg  Pulse 65  Temp(Src) 97.7 F (36.5 C) (Oral)  Resp 15  Ht 6' (1.829 m)  Wt 104.1 kg (229 lb 8 oz)  BMI 31.12 kg/m2  SpO2 96%  Intake/Output      05/19 0701 - 05/20 0700 05/20 0701 - 05/21 0700   P.O. 960    I.V. (mL/kg) 20 (0.2)    Total Intake(mL/kg) 980 (9.2)    Urine (mL/kg/hr) 100 (0)    Other  3402 (6.9)   Total Output 100 3402   Net +880 -3402          Labs  Results for Chad, [name] (MRN [id number]) as of 06/16/2014 11:46  Ref. Range 06/16/2014 07:23 06/16/2014 07:24  Sodium Latest Ref Range: 135-145 mmol/L  132 (L)  Potassium Latest Ref Range: 3.5-5.1 mmol/L  3.8  Chloride Latest Ref Range: 101-111 mmol/L  97 (L)  CO2 Latest Ref Range: 22-32 mmol/L  26  BUN Latest Ref Range: 6-20 mg/dL  14  Creatinine Latest Ref Range: 0.61-1.24 mg/dL  3.74 (H)  Calcium Latest Ref Range: 8.9-10.3 mg/dL  8.9  EGFR (Non-African Amer.) Latest Ref Range: >60 mL/min  17 (L)  EGFR (African American) Latest Ref Range: >60 mL/min  19 (L)  Glucose Latest Ref Range: 65-99 mg/dL  82  Anion gap Latest Ref Range: 5-15   9  Phosphorus Latest Ref Range: 2.5-4.6 mg/dL  3.6  Albumin Latest Ref Range: 3.5-5.0 g/dL  2.1 (L)  WBC Latest Ref Range: 4.0-10.5 K/uL 13.2 (H)   RBC Latest Ref Range: 4.22-5.81 MIL/uL 2.95 (L)   Hemoglobin Latest Ref Range: 13.0-17.0 g/dL 8.6 (L)   HCT Latest Ref Range: 39.0-52.0 % 28.1 (L)   MCV Latest Ref Range: 78.0-100.0 fL 95.3   MCH Latest Ref Range: 26.0-34.0 pg 29.2   MCHC  Latest Ref Range: 30.0-36.0 g/dL 30.6   RDW Latest Ref Range: 11.5-15.5 % 19.4 (H)   Platelets Latest Ref Range: 150-400 K/uL 184     Exam  Gen: sitting on bedside commode, NAD Ext:      Left Lower Extremity   Dressing stable             motor and sensory functions at baseline               L leg stable     Assessment and Plan   POD/HD#:  4   59 y/o male s/p I&D for persistent L distal femur osteo  1. L distal femur osteomyelitis and nonunion             New resorbable abx beads placed             No hardware implanted             Nonunion present             NWB L leg             abx per ID  2. Shock  Likely multifactorial             resolved               Continue per IM             Only on coreg for HTN at this time, amlodipine and hydralazine on hold   3. ESRD on dialysis             Dialysis completed for today              Appreciate Renals assistance   4. ID             culture positive for proteus mirabilis and abundand GPC (office cx shows MRSA)             vanc and ceftazidime with HD x 6 weeks              Appreciate ID assistance   5. Medical issues             per hospitalist             Appreciate their assistance with this highly complex pt   6. Acute on chronic anemia             stable   7. Dispo             Dc to snf or home with [facility] today     Jari Pigg, PA-C [facility] [phone] (O) 06/16/2014 11:44 AM

## 2014-06-16 NOTE — Progress Notes (Signed)
Addendum  Paged by Case management  Pt would prefer SNF and does not want to DC home Informed that would be unable to get authorization from insurance company today and would not likely occur until Monday Pt will stay through the weekend and will be discharged to snf on Monday after dialysis   Mearl Latin, PA-C [facility] [phone] (P) 06/16/2014 12:24 PM

## 2014-06-16 NOTE — Progress Notes (Signed)
ANTIBIOTIC CONSULT NOTE - FOLLOW UP  Pharmacy Consult for vancomycin and ceftazidime Indication: Osteomyelitis  No Known Allergies  Patient Measurements: Height: 6' (182.9 cm) Weight: 235 lb 7.2 oz (106.8 kg) IBW/kg (Calculated) : 77.6  Vital Signs: Temp: 98.7 F (37.1 C) (05/20 0659) Temp Source: Oral (05/20 0659) BP: 103/70 mmHg (05/20 0803) Pulse Rate: 89 (05/20 0803) Intake/Output from previous day: 05/19 0701 - 05/20 0700 In: 980 [P.O.:960; I.V.:20] Out: 100 [Urine:100] Intake/Output from this shift:    Labs:  Recent Labs  06/16/14 0723 06/16/14 0724  WBC 13.2*  --   HGB 8.6*  --   PLT 184  --   CREATININE  --  3.74*   Estimated Creatinine Clearance: 27.5 mL/min (by C-G formula based on Cr of 3.74). No results for input(s): VANCOTROUGH, VANCOPEAK, VANCORANDOM, GENTTROUGH, GENTPEAK, GENTRANDOM, TOBRATROUGH, TOBRAPEAK, TOBRARND, AMIKACINPEAK, AMIKACINTROU, AMIKACIN in the last 72 hours.   Microbiology: Recent Results (from the past 720 hour(s))  Anaerobic culture     Status: None   Collection Time: 06/08/14 11:55 AM  Result Value Ref Range Status   Specimen Description TISSUE THIGH LEFT  Final   Special Requests NONE  Final   Gram Stain   Final    ABUNDANT WBC PRESENT,BOTH PMN AND MONONUCLEAR ABUNDANT GRAM POSITIVE COCCI IN CLUSTERS IN PAIRS Performed at [facility] Gram Stain Report Called to,Read Back By and Verified With: Gram Stain Report Called to,Read Back By and Verified With: Mendel Corning RN 1320 06/08/14 [place] WILSONM Performed at [facility]    Culture   Final    NO ANAEROBES ISOLATED Performed at [facility]    Report Status 06/13/2014 FINAL  Final  Gram stain     Status: None   Collection Time: 06/08/14 11:55 AM  Result Value Ref Range Status   Specimen Description TISSUE THIGH LEFT  Final   Special Requests NONE  Final   Gram Stain   Final    ABUNDANT WBC PRESENT,BOTH PMN AND MONONUCLEAR ABUNDANT GRAM POSITIVE COCCI IN  PAIRS IN CLUSTERS Gram Stain Report Called to,Read Back By and Verified With: Mendel Corning RN 13:20 06/08/14 ([email or web address])    Report Status 06/08/2014 FINAL  Final  Tissue culture     Status: None   Collection Time: 06/08/14 11:55 AM  Result Value Ref Range Status   Specimen Description TISSUE THIGH LEFT  Final   Special Requests NONE  Final   Gram Stain   Final    ABUNDANT WBC PRESENT,BOTH PMN AND MONONUCLEAR ABUNDANT GRAM POSITIVE COCCI IN CLUSTERS IN PAIRS Performed at [facility] Gram Stain Report Called to,Read Back By and Verified With: Gram Stain Report Called to,Read Back By and Verified With: Mendel Corning RN 1320 06/08/14 [place]ILSON[place] Performed at [facility]    Culture   Final    MODERATE PROTEUS MIRABILIS Performed at [facility]    Report Status 06/11/2014 FINAL  Final   Organism ID, Bacteria PROTEUS MIRABILIS  Final      Susceptibility   Proteus mirabilis - MIC*    AMPICILLIN >=32 RESISTANT Resistant     AMPICILLIN/SULBACTAM >=32 RESISTANT Resistant     CEFAZOLIN >=64 RESISTANT Resistant     CEFEPIME <=1 SENSITIVE Sensitive     CEFTAZIDIME 4 SENSITIVE Sensitive     CEFTRIAXONE 4 SENSITIVE Sensitive     CIPROFLOXACIN <=0.25 SENSITIVE Sensitive     GENTAMICIN <=1 SENSITIVE Sensitive     IMIPENEM 2 SENSITIVE Sensitive  PIP/TAZO <=4 SENSITIVE Sensitive     TOBRAMYCIN <=1 SENSITIVE Sensitive     TRIMETH/SULFA <=20 SENSITIVE Sensitive     * MODERATE PROTEUS MIRABILIS  Anaerobic culture     Status: None   Collection Time: 06/08/14  1:58 PM  Result Value Ref Range Status   Specimen Description TISSUE  Final   Special Requests REAMINGS OF LEFT FEMUR PT ON ANCEF AND ZOSYN  Final   Gram Stain   Final    ABUNDANT WBC PRESENT,BOTH PMN AND MONONUCLEAR NO ORGANISMS SEEN Performed at [facility] Performed at [facility]    Culture   Final    NO ANAEROBES ISOLATED Performed at [facility]    Report Status 06/13/2014 FINAL  Final   Gram stain     Status: None   Collection Time: 06/08/14  1:58 PM  Result Value Ref Range Status   Specimen Description TISSUE  Final   Special Requests REAMINGS OF LEFT FEMUR  Final   Gram Stain   Final    ABUNDANT WBC PRESENT,BOTH PMN AND MONONUCLEAR NO ORGANISMS SEEN    Report Status 06/08/2014 FINAL  Final  Tissue culture     Status: None   Collection Time: 06/08/14  1:58 PM  Result Value Ref Range Status   Specimen Description TISSUE  Final   Special Requests REAMINGS OF LEFT FEMUR  Final   Gram Stain   Final    ABUNDANT WBC PRESENT,BOTH PMN AND MONONUCLEAR NO ORGANISMS SEEN Performed at [facility] Performed at [facility]    Culture   Final    NO GROWTH 3 DAYS Performed at [facility]    Report Status 06/11/2014 FINAL  Final  MRSA PCR Screening     Status: Abnormal   Collection Time: 06/08/14  6:15 PM  Result Value Ref Range Status   MRSA by PCR POSITIVE (A) NEGATIVE Final    Comment:        The GeneXpert MRSA Assay (FDA approved for NASAL specimens only), is one component of a comprehensive MRSA colonization surveillance program. It is not intended to diagnose MRSA infection nor to guide or monitor treatment for MRSA infections. RESULT CALLED TO, READ BACK BY AND VERIFIED WITH: RN,GREG HARDUK [id number]  THANEY   Culture, blood (routine x 2)     Status: None   Collection Time: 06/08/14  7:08 PM  Result Value Ref Range Status   Specimen Description BLOOD RIGHT ANTECUBITAL  Final   Special Requests   Final    BOTTLES DRAWN AEROBIC AND ANAEROBIC 5CCBLUE 3CC RED   Culture   Final    NO GROWTH 5 DAYS Note: Culture results may be compromised due to an inadequate volume of blood received in culture bottles. Performed at [facility]    Report Status 06/15/2014 FINAL  Final  Culture, blood (routine x 2)     Status: None   Collection Time: 06/08/14  7:20 PM  Result Value Ref Range Status   Specimen Description BLOOD RIGHT  HAND  Final   Special Requests BOTTLES DRAWN AEROBIC ONLY  Final   Culture   Final    NO GROWTH 5 DAYS Performed at [facility]    Report Status 06/15/2014 FINAL  Final  Urine culture     Status: None   Collection Time: 06/09/14  7:03 AM  Result Value Ref Range Status   Specimen Description URINE, RANDOM  Final   Special Requests NONE  Final  Colony Count NO GROWTH Performed at [facility]   Final   Culture NO GROWTH Performed at [facility]   Final   Report Status 06/10/2014 FINAL  Final    Anti-infectives    Start     Dose/Rate Route Frequency Ordered Stop   06/19/14 1200  vancomycin (VANCOCIN) IVPB 1000 mg/200 mL premix  Status:  Discontinued     1,000 mg 200 mL/hr over 60 Minutes Intravenous Every M-W-F (Hemodialysis) 06/14/14 0853 06/16/14 0858   06/16/14 0858  vancomycin (VANCOCIN) IVPB 1000 mg/200 mL premix     1,000 mg 200 mL/hr over 60 Minutes Intravenous Every M-W-F (Hemodialysis) 06/16/14 0858     06/14/14 1200  cefTAZidime (FORTAZ) 2 g in dextrose 5 % 50 mL IVPB     2 g 100 mL/hr over 30 Minutes Intravenous Every M-W-F (Hemodialysis) 06/14/14 0853     06/14/14 1200  vancomycin (VANCOCIN) 2,000 mg in sodium chloride 0.9 % 500 mL IVPB     2,000 mg 250 mL/hr over 120 Minutes Intravenous Every Wed (Hemodialysis) 06/14/14 0853 06/14/14 1708   06/12/14 1200  vancomycin (VANCOCIN) IVPB 1000 mg/200 mL premix  Status:  Discontinued     1,000 mg 200 mL/hr over 60 Minutes Intravenous Every M-W-F (Hemodialysis) 06/09/14 1334 06/10/14 1235   06/12/14 1200  meropenem (MERREM) 500 mg in sodium chloride 0.9 % 50 mL IVPB  Status:  Discontinued     500 mg 100 mL/hr over 30 Minutes Intravenous Every M-W-F (Hemodialysis) 06/09/14 1334 06/09/14 1501   06/11/14 1200  cefTRIAXone (ROCEPHIN) 2 g in dextrose 5 % 50 mL IVPB - Premix  Status:  Discontinued     2 g 100 mL/hr over 30 Minutes Intravenous Every 24 hours 06/11/14 1100 06/14/14 0853   06/10/14  1200  meropenem (MERREM) 500 mg in sodium chloride 0.9 % 50 mL IVPB  Status:  Discontinued     500 mg 100 mL/hr over 30 Minutes Intravenous Every Sat (Hemodialysis) 06/09/14 1334 06/09/14 1501   06/10/14 1200  vancomycin (VANCOCIN) IVPB 1000 mg/200 mL premix     1,000 mg 200 mL/hr over 60 Minutes Intravenous Every Sat (Hemodialysis) 06/09/14 1334 06/10/14 1142   06/09/14 1800  meropenem (MERREM) 500 mg in sodium chloride 0.9 % 50 mL IVPB  Status:  Discontinued     500 mg 100 mL/hr over 30 Minutes Intravenous Daily after supper 06/09/14 1501 06/11/14 1100   06/08/14 1900  piperacillin-tazobactam (ZOSYN) IVPB 2.25 g  Status:  Discontinued     2.25 g 100 mL/hr over 30 Minutes Intravenous 3 times per day 06/08/14 1134 06/08/14 1812   06/08/14 1830  meropenem (MERREM) 500 mg in sodium chloride 0.9 % 50 mL IVPB  Status:  Discontinued     500 mg 100 mL/hr over 30 Minutes Intravenous Every M-W-F (Hemodialysis) 06/08/14 1824 06/09/14 1316   06/08/14 1800  vancomycin (VANCOCIN) IVPB 1000 mg/200 mL premix  Status:  Discontinued     1,000 mg 200 mL/hr over 60 Minutes Intravenous Every M-W-F (Hemodialysis) 06/08/14 1749 06/09/14 1316   06/08/14 1232  tobramycin (NEBCIN) injection  Status:  Discontinued       As needed 06/08/14 1234 06/08/14 1501   06/08/14 1231  vancomycin (VANCOCIN) powder  Status:  Discontinued       As needed 06/08/14 1232 06/08/14 1501   06/08/14 1100  piperacillin-tazobactam (ZOSYN) IVPB 3.375 g  Status:  Discontinued     3.375 g 100 mL/hr over 30 Minutes Intravenous  Once  06/08/14 1046 06/08/14 1055   06/08/14 1100  piperacillin-tazobactam (ZOSYN) IVPB 3.375 g     3.375 g 100 mL/hr over 30 Minutes Intravenous On call to O.R. 06/08/14 1055 06/08/14 1335   06/08/14 0930  vancomycin (VANCOCIN) IVPB 1000 mg/200 mL premix     1,000 mg 200 mL/hr over 60 Minutes Intravenous On call to O.R. 06/08/14 0929 06/08/14 1105      Assessment: 58 YOM with chronic infection of L distal  femur. Had removal of old hardware and L femur union. Found to have proteus mirabilis in tissue cx. This is day #9 of abx for osteomyelitis, now on vancomycin and ceftazidime. ID recommends 6 weeks of abx therapy. Dr. Lajoyce Corners is recommending AKA. Pt is afebrile and WBC is 13.2 today. Will give vancomycin dose today after HD and plan to check random level tomorrow with am labs if pt still inpatient.  Goal of Therapy:  Resolution of infection Pre-HD vancomycin level 15-25 mcg/mL  Plan:  Continue Vancomycin 1g IV QHD-MWF Continue Ceftazidime 2g IV QHD-MWF Check random vancomycin level with am labs if patient still here Monitor CBC, cultures and clinical progress, VT at [facility]  Chad [name] 06/16/2014,9:00 AM

## 2014-06-16 NOTE — Progress Notes (Signed)
[name] Progress Note  Assessment/Plan: 1. Left distal femur osteo s/p I and D with beads, wound culture + (office culture showed MRSA, inpatient culture grew Proteus), on Vanc and Fortaz; NWB Left leg; s/p septic shock; - please specify duration of antibioitc tx at discharge - can provide at outpt HD unit 2. ESRD/ hx of failed transplant - MWF K 3.8 on 4 K bath/tapering prednisone 3. Anemia -  Hgb 8.6- stable  Aranesp 100 last 5/16 26% sat - hold on adding Fe for now 4. Secondary hyperparathyroidism - controlled with Hectorol and phoslo 5. BP/volume - controlled, coreg decreased to 3.125 bid, other meds on hold; have been titrating volume as outpt 6. Nutrition - alb 2.1 7. Disp - SNF vs HH  Chad Slider, PA-C [facility] Beeper [phone] 06/16/2014,8:11 AM  LOS: 8 days   Pt seen, examined and agree w A/P as above.  Vinson Moselle MD pager [phone]    cell [phone] 06/16/2014, 10:35 AM    Subjective:   Not sure if going home or to NH - pt's friend checking out [facility] today  Objective Filed Vitals:   06/16/14 0712 06/16/14 0717 06/16/14 0730 06/16/14 0803  BP: 116/63 116/63 104/71 103/70  Pulse: 77 77 82 89  Temp:      TempSrc:      Resp: 16 16 16 16   Height:      Weight:      SpO2:       Physical Exam General: NAD Heart: RRR 2/6 murmur Lungs: no rales Abdomen:soft NT Extremities: bilateral legs wrapped, + edema Dialysis Access: right IJ cath and left AVF   Dialysis Orders: MWF [facility] 4hr 30 mins 103.5kgs 2K/2.5ca 5000u heparin Hectorol 1   Additional Objective Labs: Basic Metabolic Panel:  Recent Labs Lab 06/10/14 0428 06/12/14 0500 06/16/14 0724  NA 131* 132* 132*  K 4.2 3.6 3.8  CL 96* 96* 97*  CO2 25 24 26   GLUCOSE 126* 87 82  BUN 38* 32* 14  CREATININE 4.89* 4.31* 3.74*  CALCIUM 8.1* 8.2* 8.9  PHOS 6.1* 3.7 3.6   Liver Function Tests:  Recent Labs Lab 06/10/14 0428 06/12/14 0500  06/16/14 0724  ALBUMIN 2.2* 2.2* 2.1*  CBC:  Recent Labs Lab 06/10/14 0428 06/12/14 0500 06/16/14 0723  WBC 16.8* 14.3* 13.2*  HGB 7.6* 8.4* 8.6*  HCT 23.7* 25.9* 28.1*  MCV 90.1 91.8 95.3  PLT 130* 173 184   Blood Culture    Component Value Date/Time   SDES URINE, RANDOM 06/09/2014 0703   SPECREQUEST NONE 06/09/2014 0703   CULT NO GROWTH Performed at [facility]  06/09/2014 0703   REPTSTATUS 06/10/2014 FINAL 06/09/2014 0703    CBG:  Recent Labs Lab 06/10/14 2151 06/11/14 0630 06/11/14 1157 06/11/14 1625 06/11/14 2207  GLUCAP 115* 88 77 91 107*   Iron Studies:  Recent Labs  06/16/14 0445  IRON 45  TIBC 172*   Studies/Results: No results found. Medications: . sodium chloride 10 mL/hr at 06/08/14 1035   . allopurinol  100 mg Oral BID  . amiodarone  100 mg Oral Daily  . aspirin EC  325 mg Oral Daily  . calcium acetate  667 mg Oral TID WC  . carvedilol  3.125 mg Oral BID WC  . cefTAZidime (FORTAZ)  IV  2 g Intravenous Q M,W,F-HD  . darbepoetin (ARANESP) injection - DIALYSIS  100 mcg Intravenous Q Mon-HD  . docusate sodium  100 mg Oral BID  .  doxercalciferol  1 mcg Intravenous Q M,W,F-HD  . heparin subcutaneous  5,000 Units Subcutaneous 3 times per day  . multivitamin  1 tablet Oral QHS  . pravastatin  40 mg Oral q1800  . predniSONE  5 mg Oral Q breakfast  . [START ON 06/19/2014] vancomycin  1,000 mg Intravenous Q M,W,F-HD

## 2014-06-16 NOTE — Progress Notes (Addendum)
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 Active Problems:   Thrombocytopenia   Acute respiratory failure   Renal transplant disorder   Cardiomyopathy   Atrial flutter   Ulcer of left lower leg   Chronic systolic CHF (congestive heart failure)   CKD (chronic kidney disease)   Hypertension   History of renal transplant   Osteomyelitis   Hepatitis C   Hyperparathyroidism due to renal insufficiency   Shock   Arterial hypotension   ESRD (end stage renal disease)   ESRD on dialysis   Acute blood loss anemia  Abdominal pain  - quite uncomfortable on exam, will obtain plain films - discussed with RN if he has diarrhea overnight or watery stools will need a C diff sent.  Hypotension - Multifactorial including sepsis, post anesthetic effect, and possible adrenal insufficiency given outpatient prednisone - BP normal now - Has been weaned off of vasopressors, out of ICD since 5/14 - continue to hold home meds imdur, amlodipine and hydralazine;  - bit hypotensive in HD today, however overall this is stable, would be OK to discharge without amlodipine and hydralazine for now, these can be restarted as an outpatient  Osteomyelitis left femur - Status post I&D 06/08/2014, Dr. Carola Frost - Wound care per orthopedics - Antibiotics per infectious disease, on Vancomycin and Elita Quick which are given with HD - wound culture +proteus, Blood cultures remain negative, wbc improving - Patient ultimately plans to go back home after this hospitalization--he does not want to go to skilled nursing facility - placement pending, antibiotics per ID, with HD, no need for PICC line; patient tells me today he does not want the  surgery; Insurance denied CIR and he is planned for SNF on Monday   Chronic systolic CHF/nonischemic cardiomyopathy - Previously had EF as low as 15-20% - Follows Dr. [name] - Most recent echocardiogram 06/13/2013 showed EF 60%, on low dose coreg only due to low bp,  - volume managed by HD - on room air now.  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 - Currently in sinus rhythm   Anemia of CKD - Patient's baseline hemoglobin has had wide variations but baseline seems to be around 11-12 - Drop in hemoglobin likely dilutional and surgery, now stable - Aranesp per nephrology  Hyperlipidemia - Continue pravastatin   He was not discharged today, will be here until Monday, has more abdominal pain, medicine will follow through the weekend.    Code Status: full Family Communication: patient  Disposition Plan: per primary team  HPI 59 year old male with a history of nonischemic cardiomyopathy, paroxysmal atrial flutter not on anticoagulation secondary to GI bleed, ESRD (MWF) with failed renal transplant (1996), hepatitis C, and osteomyelitis of the left femur. The patient initially had a distal femur fracture in June 2014 and underwent ORIF at that time. He subsequently  developed a wound infection and underwent irrigation and debridement. He had been following infectious disease. On 03/14/2014, the patient had his hardware removed and had placement of antibiotic beads. He was seen in the orthopedic office on 06/07/2014. He was noted to have purulent drainage. He was admitted on 06/08/2014 for repeat irrigation and debridement. Postoperatively he was extubated successfully. Unfortunately, the patient developed hypotension that was refractory to 3 doses of colloid infusion. Critical care was consulted, and the patient was transferred to the ICU where he was placed on vasopressors. It was thought that the patient was possibly septic secondary to his wound infection and osteomyelitis as well as residual effects from his anesthesia. He was initially placed on stress steroids which have been weaned to his baseline dose. He has been since weaned off of his vasopressors and was transferred out of the ICU on 06/10/2014. As a result, internal medicine consultation was obtained for medical management. During this hospitalization, nephrology has been consulted for his dialysis needs. In addition, infectious disease has been following for his antibiotic regimen.   Procedures:  HD  I/d left thigh 5/12  Antibiotics:  Rocephin  Vancomycin  Objective: BP 86/53 mmHg  Pulse 91  Temp(Src) 98.8 F (37.1 C) (Oral)  Resp 16  Ht 6' (1.829 m)  Wt 104.1 kg (229 lb 8 oz)  BMI 31.12 kg/m2  SpO2 98%  Intake/Output Summary (Last 24 hours) at 06/16/14 1728 Last data filed at 06/16/14 1201  Gross per 24 hour  Intake    480 ml  Output   3502 ml  Net  -3022 ml   Filed Weights   06/14/14 1332 06/16/14 0659 06/16/14 1042  Weight: 108.4 kg (238 lb 15.7 oz) 106.8 kg (235 lb 7.2 oz) 104.1 kg (229 lb 8 oz)   Exam:  General:  NAD  Cardiovascular: RRR  Respiratory: CTABL  Abdomen: Soft/ tender to palpation throughout, decreased BS  Musculoskeletal: post op changes left  thigh  Neuro: aaox3, non focal   Data Reviewed: Basic Metabolic Panel:  Recent Labs Lab 06/10/14 0428 06/12/14 0500 06/16/14 0724  NA 131* 132* 132*  K 4.2 3.6 3.8  CL 96* 96* 97*  CO2 GLUCOSE 126* 87 82  BUN 38* 32* 14  CREATININE 4.89* 4.31* 3.74*  CALCIUM 8.1* 8.2* 8.9  PHOS 6.1* 3.7 3.6   Liver Function Tests:  Recent Labs Lab 06/10/14 0428 06/12/14 0500 06/16/14 0724  ALBUMIN 2.2* 2.2* 2.1*   CBC:  Recent Labs Lab 06/10/14 0428 06/12/14 0500 06/16/14 0723  WBC 16.8* 14.3* 13.2*  HGB 7.6* 8.4* 8.6*  HCT 23.7* 25.9* 28.1*  MCV 90.1 91.8 95.3  PLT 130* 173 184   CBG:  Recent Labs Lab 06/10/14 2151 06/11/14 0630 06/11/14 1157 06/11/14 1625 06/11/14 2207  GLUCAP 115* 88 77 91 107*    Recent Results (from the past 240 hour(s))  Anaerobic culture     Status: None   Collection Time: 06/08/14 11:55 AM  Result Value Ref Range Status   Specimen Description TISSUE THIGH LEFT  Final   Special Requests NONE  Final   Gram Stain   Final    ABUNDANT WBC PRESENT,BOTH  PMN AND MONONUCLEAR ABUNDANT GRAM POSITIVE COCCI IN CLUSTERS IN PAIRS Performed at [facility] Gram Stain Report Called to,Read Back By and Verified With: Gram Stain Report Called to,Read Back By and Verified With: Mendel Corning RN 1320 06/08/14 [place] WILSONM Performed at [facility]    Culture   Final    NO ANAEROBES ISOLATED Performed at [facility]    Report Status 06/13/2014 FINAL  Final  Gram stain     Status: None   Collection Time: 06/08/14 11:55 AM  Result Value Ref Range Status   Specimen Description TISSUE THIGH LEFT  Final   Special Requests NONE  Final   Gram Stain   Final    ABUNDANT WBC PRESENT,BOTH PMN AND MONONUCLEAR ABUNDANT GRAM POSITIVE COCCI IN PAIRS IN CLUSTERS Gram Stain Report Called to,Read Back By and Verified With: Mendel Corning RN 13:20 06/08/14 ([email or web address])    Report Status 06/08/2014 FINAL  Final  Tissue culture     Status: None    Collection Time: 06/08/14 11:55 AM  Result Value Ref Range Status   Specimen Description TISSUE THIGH LEFT  Final   Special Requests NONE  Final   Gram Stain   Final    ABUNDANT WBC PRESENT,BOTH PMN AND MONONUCLEAR ABUNDANT GRAM POSITIVE COCCI IN CLUSTERS IN PAIRS Performed at [facility] Gram Stain Report Called to,Read Back By and Verified With: Gram Stain Report Called to,Read Back By and Verified With: Mendel Corning RN 1320 06/08/14 [place] WILSONM Performed at [facility]    Culture   Final    MODERATE PROTEUS MIRABILIS Performed at [facility]    Report Status 06/11/2014 FINAL  Final   Organism ID, Bacteria PROTEUS MIRABILIS  Final      Susceptibility   Proteus mirabilis - MIC*    AMPICILLIN >=32 RESISTANT Resistant     AMPICILLIN/SULBACTAM >=32 RESISTANT Resistant     CEFAZOLIN >=64 RESISTANT Resistant     CEFEPIME <=1 SENSITIVE Sensitive     CEFTAZIDIME 4 SENSITIVE Sensitive     CEFTRIAXONE 4 SENSITIVE Sensitive     CIPROFLOXACIN <=0.25 SENSITIVE Sensitive     GENTAMICIN <=1 SENSITIVE Sensitive     IMIPENEM 2 SENSITIVE Sensitive     PIP/TAZO <=4 SENSITIVE Sensitive     TOBRAMYCIN <=1 SENSITIVE Sensitive     TRIMETH/SULFA <=20 SENSITIVE Sensitive     * MODERATE PROTEUS MIRABILIS  Anaerobic culture     Status: None   Collection Time: 06/08/14  1:58 PM  Result Value Ref Range Status   Specimen Description TISSUE  Final   Special Requests REAMINGS OF LEFT FEMUR PT ON ANCEF AND ZOSYN  Final   Gram Stain   Final    ABUNDANT WBC PRESENT,BOTH PMN AND MONONUCLEAR NO ORGANISMS SEEN Performed at [facility] Performed at [facility]    Culture   Final    NO ANAEROBES ISOLATED Performed at [facility]    Report Status 06/13/2014 FINAL  Final  Gram stain     Status: None   Collection Time: 06/08/14  1:58 PM  Result Value Ref Range Status   Specimen Description TISSUE  Final   Special Requests REAMINGS OF LEFT FEMUR  Final   Gram  Stain   Final    ABUNDANT WBC PRESENT,BOTH PMN AND MONONUCLEAR NO ORGANISMS SEEN    Report Status 06/08/2014 FINAL  Final  Tissue culture     Status: None   Collection Time: 06/08/14  1:58 PM  Result Value Ref Range Status   Specimen Description TISSUE  Final   Special Requests REAMINGS OF LEFT FEMUR  Final   Gram Stain   Final    ABUNDANT WBC PRESENT,BOTH PMN AND MONONUCLEAR NO ORGANISMS SEEN Performed at [facility] Performed at [facility]    Culture   Final    NO GROWTH 3 DAYS Performed at [facility]    Report Status 06/11/2014 FINAL  Final  MRSA PCR Screening     Status: Abnormal   Collection Time: 06/08/14  6:15 PM  Result Value Ref Range Status   MRSA by PCR POSITIVE (A) NEGATIVE Final    Comment:        The GeneXpert MRSA Assay (FDA approved for NASAL specimens only), is one component of a comprehensive MRSA colonization surveillance program. It is not intended to diagnose MRSA infection nor to guide or monitor treatment for MRSA infections. RESULT CALLED TO, READ BACK BY AND VERIFIED WITH: RN,GREG HARDUK [id number]  THANEY   Culture, blood (routine x 2)     Status: None   Collection Time: 06/08/14  7:08 PM  Result Value Ref Range Status   Specimen Description BLOOD RIGHT ANTECUBITAL  Final   Special Requests   Final    BOTTLES DRAWN AEROBIC AND ANAEROBIC 5CCBLUE 3CC RED   Culture   Final    NO GROWTH 5 DAYS Note: Culture results may be compromised due to an inadequate volume of blood received in culture bottles. Performed at [facility]    Report Status 06/15/2014 FINAL  Final  Culture, blood (routine x 2)     Status: None   Collection Time: 06/08/14  7:20 PM  Result Value Ref Range Status   Specimen Description BLOOD RIGHT HAND  Final   Special Requests BOTTLES DRAWN AEROBIC ONLY  Final   Culture   Final    NO GROWTH 5 DAYS Performed at [facility]    Report Status 06/15/2014 FINAL  Final  Urine  culture     Status: None   Collection Time: 06/09/14  7:03 AM  Result Value Ref Range Status   Specimen Description URINE, RANDOM  Final   Special Requests NONE  Final   Colony Count NO GROWTH Performed at [facility]   Final   Culture NO GROWTH Performed at [facility]   Final   Report Status 06/10/2014 FINAL  Final     Studies: No results found.  Scheduled Meds: . allopurinol  100 mg Oral BID  . amiodarone  100 mg Oral Daily  . aspirin EC  325 mg Oral Daily  . calcium acetate  667 mg Oral TID WC  . carvedilol  3.125 mg Oral BID WC  . cefTAZidime (FORTAZ)  IV  2 g Intravenous Q M,W,F-HD  . darbepoetin (ARANESP) injection - DIALYSIS  100 mcg Intravenous Q Mon-HD  . docusate sodium  100 mg Oral BID  . doxercalciferol  1 mcg Intravenous Q M,W,F-HD  . heparin subcutaneous  5,000 Units Subcutaneous 3 times per day  . multivitamin  1 tablet Oral QHS  . pravastatin  40 mg Oral q1800  . predniSONE  5 mg Oral Q breakfast  . vancomycin  1,000 mg Intravenous Q M,W,F-HD    Continuous Infusions: . sodium chloride 10 mL/hr at 06/08/14 1035    Pamella Pert MD  [name] Pager [phone]. If 7PM-7AM, please contact night-coverage at [email or web address], password [facility] 06/16/2014, 5:28 PM  LOS:  8 days

## 2014-06-17 LAB — BASIC METABOLIC PANEL
ANION GAP: 9 (ref 5–15)
BUN: 7 mg/dL (ref 6–20)
CO2: 27 mmol/L (ref 22–32)
Calcium: 8.9 mg/dL (ref 8.9–10.3)
Chloride: 94 mmol/L — ABNORMAL LOW (ref 101–111)
Creatinine, Ser: 3.14 mg/dL — ABNORMAL HIGH (ref 0.61–1.24)
GFR calc Af Amer: 24 mL/min — ABNORMAL LOW (ref >60)
GFR, EST NON AFRICAN AMERICAN: 20 mL/min — AB (ref >60)
Glucose, Bld: 91 mg/dL (ref 65–99)
POTASSIUM: 4.8 mmol/L (ref 3.5–5.1)
Sodium: 130 mmol/L — ABNORMAL LOW (ref 135–145)

## 2014-06-17 LAB — CBC
HEMATOCRIT: 29.1 % — AB (ref 39.0–52.0)
HEMOGLOBIN: 9.2 g/dL — AB (ref 13.0–17.0)
MCH: 30.8 pg (ref 26.0–34.0)
MCHC: 31.6 g/dL (ref 30.0–36.0)
MCV: 97.3 fL (ref 78.0–100.0)
PLATELETS: 164 10*3/uL (ref 150–400)
RBC: 2.99 MIL/uL — ABNORMAL LOW (ref 4.22–5.81)
RDW: 19.6 % — AB (ref 11.5–15.5)
WBC: 15.3 10*3/uL — ABNORMAL HIGH (ref 4.0–10.5)

## 2014-06-17 LAB — VANCOMYCIN, RANDOM: VANCOMYCIN RM: 35 ug/mL

## 2014-06-17 MED ORDER — SACCHAROMYCES BOULARDII 250 MG PO CAPS
250.0000 mg | ORAL_CAPSULE | Freq: Two times a day (BID) | ORAL | Status: DC
  Administered 2014-06-17 – 2014-06-18 (×3): 250 mg via ORAL
  Filled 2014-06-17 (×6): qty 1

## 2014-06-17 NOTE — Progress Notes (Signed)
ANTIBIOTIC CONSULT NOTE - FOLLOW UP  Pharmacy Consult for vancomycin and ceftazidime Indication: Osteomyelitis  No Known Allergies  Patient Measurements: Height: 6' (182.9 cm) Weight: 229 lb 8 oz (104.1 kg) IBW/kg (Calculated) : 77.6  Vital Signs: Temp: 98.8 F (37.1 C) (05/21 0602) Temp Source: Oral (05/20 2108) BP: 107/70 mmHg (05/21 0602) Pulse Rate: 88 (05/21 0602) Intake/Output from previous day: 05/20 0701 - 05/21 0700 In: 120 [P.O.:120] Out: 3477 [Urine:75] Intake/Output from this shift:    Labs:  Recent Labs  06/16/14 0723 06/16/14 0724 06/17/14 0530  WBC 13.2*  --  15.3*  HGB 8.6*  --  9.2*  PLT 184  --  164  CREATININE  --  3.74* 3.14*   Estimated Creatinine Clearance: 32.4 mL/min (by C-G formula based on Cr of 3.14).  Recent Labs  06/17/14 0530  VANCORANDOM 35     Microbiology: Recent Results (from the past 720 hour(s))  Anaerobic culture     Status: None   Collection Time: 06/08/14 11:55 AM  Result Value Ref Range Status   Specimen Description TISSUE THIGH LEFT  Final   Special Requests NONE  Final   Gram Stain   Final    ABUNDANT WBC PRESENT,BOTH PMN AND MONONUCLEAR ABUNDANT GRAM POSITIVE COCCI IN CLUSTERS IN PAIRS Performed at [facility] Gram Stain Report Called to,Read Back By and Verified With: Gram Stain Report Called to,Read Back By and Verified With: Mendel Corning RN 1320 06/08/14 [place] WILSONM Performed at [facility]    Culture   Final    NO ANAEROBES ISOLATED Performed at [facility]    Report Status 06/13/2014 FINAL  Final  Gram stain     Status: None   Collection Time: 06/08/14 11:55 AM  Result Value Ref Range Status   Specimen Description TISSUE THIGH LEFT  Final   Special Requests NONE  Final   Gram Stain   Final    ABUNDANT WBC PRESENT,BOTH PMN AND MONONUCLEAR ABUNDANT GRAM POSITIVE COCCI IN PAIRS IN CLUSTERS Gram Stain Report Called to,Read Back By and Verified With: Mendel Corning RN 13:20 06/08/14  ([email or web address])    Report Status 06/08/2014 FINAL  Final  Tissue culture     Status: None   Collection Time: 06/08/14 11:55 AM  Result Value Ref Range Status   Specimen Description TISSUE THIGH LEFT  Final   Special Requests NONE  Final   Gram Stain   Final    ABUNDANT WBC PRESENT,BOTH PMN AND MONONUCLEAR ABUNDANT GRAM POSITIVE COCCI IN CLUSTERS IN PAIRS Performed at [facility] Gram Stain Report Called to,Read Back By and Verified With: Gram Stain Report Called to,Read Back By and Verified With: Mendel Corning RN 1320 06/08/14 [place] WILSONM Performed at [facility]    Culture   Final    MODERATE PROTEUS MIRABILIS Performed at [facility]    Report Status 06/11/2014 FINAL  Final   Organism ID, Bacteria PROTEUS MIRABILIS  Final      Susceptibility   Proteus mirabilis - MIC*    AMPICILLIN >=32 RESISTANT Resistant     AMPICILLIN/SULBACTAM >=32 RESISTANT Resistant     CEFAZOLIN >=64 RESISTANT Resistant     CEFEPIME <=1 SENSITIVE Sensitive     CEFTAZIDIME 4 SENSITIVE Sensitive     CEFTRIAXONE 4 SENSITIVE Sensitive     CIPROFLOXACIN <=0.25 SENSITIVE Sensitive     GENTAMICIN <=1 SENSITIVE Sensitive     IMIPENEM 2 SENSITIVE Sensitive     PIP/TAZO <=4 SENSITIVE Sensitive  TOBRAMYCIN <=1 SENSITIVE Sensitive     TRIMETH/SULFA <=20 SENSITIVE Sensitive     * MODERATE PROTEUS MIRABILIS  Anaerobic culture     Status: None   Collection Time: 06/08/14  1:58 PM  Result Value Ref Range Status   Specimen Description TISSUE  Final   Special Requests REAMINGS OF LEFT FEMUR PT ON ANCEF AND ZOSYN  Final   Gram Stain   Final    ABUNDANT WBC PRESENT,BOTH PMN AND MONONUCLEAR NO ORGANISMS SEEN Performed at [facility] Performed at [facility]    Culture   Final    NO ANAEROBES ISOLATED Performed at [facility]    Report Status 06/13/2014 FINAL  Final  Gram stain     Status: None   Collection Time: 06/08/14  1:58 PM  Result Value Ref Range Status    Specimen Description TISSUE  Final   Special Requests REAMINGS OF LEFT FEMUR  Final   Gram Stain   Final    ABUNDANT WBC PRESENT,BOTH PMN AND MONONUCLEAR NO ORGANISMS SEEN    Report Status 06/08/2014 FINAL  Final  Tissue culture     Status: None   Collection Time: 06/08/14  1:58 PM  Result Value Ref Range Status   Specimen Description TISSUE  Final   Special Requests REAMINGS OF LEFT FEMUR  Final   Gram Stain   Final    ABUNDANT WBC PRESENT,BOTH PMN AND MONONUCLEAR NO ORGANISMS SEEN Performed at [facility] Performed at [facility]    Culture   Final    NO GROWTH 3 DAYS Performed at [facility]    Report Status 06/11/2014 FINAL  Final  MRSA PCR Screening     Status: Abnormal   Collection Time: 06/08/14  6:15 PM  Result Value Ref Range Status   MRSA by PCR POSITIVE (A) NEGATIVE Final    Comment:        The GeneXpert MRSA Assay (FDA approved for NASAL specimens only), is one component of a comprehensive MRSA colonization surveillance program. It is not intended to diagnose MRSA infection nor to guide or monitor treatment for MRSA infections. RESULT CALLED TO, READ BACK BY AND VERIFIED WITH: RN,GREG HARDUK [id number] @2121  THANEY   Culture, blood (routine x 2)     Status: None   Collection Time: 06/08/14  7:08 PM  Result Value Ref Range Status   Specimen Description BLOOD RIGHT ANTECUBITAL  Final   Special Requests   Final    BOTTLES DRAWN AEROBIC AND ANAEROBIC 5CCBLUE 3CC RED   Culture   Final    NO GROWTH 5 DAYS Note: Culture results may be compromised due to an inadequate volume of blood received in culture bottles. Performed at [facility]    Report Status 06/15/2014 FINAL  Final  Culture, blood (routine x 2)     Status: None   Collection Time: 06/08/14  7:20 PM  Result Value Ref Range Status   Specimen Description BLOOD RIGHT HAND  Final   Special Requests BOTTLES DRAWN AEROBIC ONLY  Final   Culture   Final    NO GROWTH 5  DAYS Performed at [facility]    Report Status 06/15/2014 FINAL  Final  Urine culture     Status: None   Collection Time: 06/09/14  7:03 AM  Result Value Ref Range Status   Specimen Description URINE, RANDOM  Final   Special Requests NONE  Final   Colony Count NO GROWTH Performed at  [facility]   Final   Culture NO GROWTH Performed at [facility]   Final   Report Status 06/10/2014 FINAL  Final    Anti-infectives    Start     Dose/Rate Route Frequency Ordered Stop   06/19/14 1200  vancomycin (VANCOCIN) IVPB 1000 mg/200 mL premix  Status:  Discontinued     1,000 mg 200 mL/hr over 60 Minutes Intravenous Every M-W-F (Hemodialysis) 06/14/14 0853 06/16/14 0858   06/16/14 0858  vancomycin (VANCOCIN) IVPB 1000 mg/200 mL premix     1,000 mg 200 mL/hr over 60 Minutes Intravenous Every M-W-F (Hemodialysis) 06/16/14 0858     06/16/14 0000  cefTAZidime 2 g in dextrose 5 % 50 mL     2 g 100 mL/hr over 30 Minutes Intravenous Every M-W-F (Hemodialysis) 06/16/14 1153     06/16/14 0000  vancomycin (VANCOCIN) 1 GM/200ML SOLN     1,000 mg 200 mL/hr over 60 Minutes Intravenous Every M-W-F (Hemodialysis) 06/16/14 1153     06/14/14 1200  cefTAZidime (FORTAZ) 2 g in dextrose 5 % 50 mL IVPB     2 g 100 mL/hr over 30 Minutes Intravenous Every M-W-F (Hemodialysis) 06/14/14 0853     06/14/14 1200  vancomycin (VANCOCIN) 2,000 mg in sodium chloride 0.9 % 500 mL IVPB     2,000 mg 250 mL/hr over 120 Minutes Intravenous Every Wed (Hemodialysis) 06/14/14 0853 06/14/14 1708   06/12/14 1200  vancomycin (VANCOCIN) IVPB 1000 mg/200 mL premix  Status:  Discontinued     1,000 mg 200 mL/hr over 60 Minutes Intravenous Every M-W-F (Hemodialysis) 06/09/14 1334 06/10/14 1235   06/12/14 1200  meropenem (MERREM) 500 mg in sodium chloride 0.9 % 50 mL IVPB  Status:  Discontinued     500 mg 100 mL/hr over 30 Minutes Intravenous Every M-W-F (Hemodialysis) 06/09/14 1334 06/09/14 1501   06/11/14  1200  cefTRIAXone (ROCEPHIN) 2 g in dextrose 5 % 50 mL IVPB - Premix  Status:  Discontinued     2 g 100 mL/hr over 30 Minutes Intravenous Every 24 hours 06/11/14 1100 06/14/14 0853   06/10/14 1200  meropenem (MERREM) 500 mg in sodium chloride 0.9 % 50 mL IVPB  Status:  Discontinued     500 mg 100 mL/hr over 30 Minutes Intravenous Every Sat (Hemodialysis) 06/09/14 1334 06/09/14 1501   06/10/14 1200  vancomycin (VANCOCIN) IVPB 1000 mg/200 mL premix     1,000 mg 200 mL/hr over 60 Minutes Intravenous Every Sat (Hemodialysis) 06/09/14 1334 06/10/14 1142   06/09/14 1800  meropenem (MERREM) 500 mg in sodium chloride 0.9 % 50 mL IVPB  Status:  Discontinued     500 mg 100 mL/hr over 30 Minutes Intravenous Daily after supper 06/09/14 1501 06/11/14 1100   06/08/14 1900  piperacillin-tazobactam (ZOSYN) IVPB 2.25 g  Status:  Discontinued     2.25 g 100 mL/hr over 30 Minutes Intravenous 3 times per day 06/08/14 1134 06/08/14 1812   06/08/14 1830  meropenem (MERREM) 500 mg in sodium chloride 0.9 % 50 mL IVPB  Status:  Discontinued     500 mg 100 mL/hr over 30 Minutes Intravenous Every M-W-F (Hemodialysis) 06/08/14 1824 06/09/14 1316   06/08/14 1800  vancomycin (VANCOCIN) IVPB 1000 mg/200 mL premix  Status:  Discontinued     1,000 mg 200 mL/hr over 60 Minutes Intravenous Every M-W-F (Hemodialysis) 06/08/14 1749 06/09/14 1316   06/08/14 1232  tobramycin (NEBCIN) injection  Status:  Discontinued       As needed  06/08/14 1234 06/08/14 1501   06/08/14 1231  vancomycin (VANCOCIN) powder  Status:  Discontinued       As needed 06/08/14 1232 06/08/14 1501   06/08/14 1100  piperacillin-tazobactam (ZOSYN) IVPB 3.375 g  Status:  Discontinued     3.375 g 100 mL/hr over 30 Minutes Intravenous  Once 06/08/14 1046 06/08/14 1055   06/08/14 1100  piperacillin-tazobactam (ZOSYN) IVPB 3.375 g     3.375 g 100 mL/hr over 30 Minutes Intravenous On call to O.R. 06/08/14 1055 06/08/14 1335   06/08/14 0930  vancomycin  (VANCOCIN) IVPB 1000 mg/200 mL premix     1,000 mg 200 mL/hr over 60 Minutes Intravenous On call to O.R. 06/08/14 0929 06/08/14 1105      Assessment: 58 YOM with chronic infection of L distal femur. Had removal of old hardware and L femur union. Found to have proteus mirabilis in tissue cx. This is day #10 of abx for osteomyelitis, now on vancomycin and ceftazidime. ID recommends 6 weeks of abx therapy. Dr. Lajoyce Corners is recommending AKA. Pt is afebrile and WBC is up to 15.3 today.  Pt has been tolerating full sessions of HD well (BFR 350-400 mL/min x 3.5hr on 5/20).  Vanc random today may not be truly reflective of his steady state level as he has only received a loading dose and 2 subsequent doses.  Goal of Therapy:  Resolution of infection Pre-HD vancomycin level 15-25 mcg/mL  Plan:  Continue Vancomycin 1g IV QHD-MWF Continue Ceftazidime 2g IV QHD-MWF Check pre-HD level immediately prior to HD on 5/25 Monitor CBC, cultures and clinical progress, pre-HD vanc levels as appropriate  Waynette Buttery, PharmD Clinical Pharmacy Resident Pager: [phone] 06/17/2014 7:43 AM

## 2014-06-17 NOTE — Progress Notes (Signed)
Patient ID: JESE BENCE, male   DOB: 30-Apr-1955, 59 y.o.   MRN: [id number]     Subjective:  Patient reports pain as mild.  Patient sitting on the side of the bed in no acute distress.  Waiting on SNF placement  Objective:   VITALS:   Filed Vitals:   06/16/14 1200 06/16/14 1300 06/16/14 2108 06/17/14 0602  BP: 100/68 86/53 91/54  107/70  Pulse: 99 91 88 88  Temp: 97.9 F (36.6 C) 98.8 F (37.1 C) 99 F (37.2 C) 98.8 F (37.1 C)  TempSrc: Oral Oral Oral   Resp: 16 16 16 17   Height:      Weight:      SpO2: 96% 98% 98% 96%    ABD soft Sensation intact distally Dorsiflexion/Plantar flexion intact Incision: dressing C/D/I and no drainage   Lab Results  Component Value Date   WBC 15.3* 06/17/2014   HGB 9.2* 06/17/2014   HCT 29.1* 06/17/2014   MCV 97.3 06/17/2014   PLT 164 06/17/2014   BMET    Component Value Date/Time   NA 130* 06/17/2014 0530   K 4.8 06/17/2014 0530   CL 94* 06/17/2014 0530   CO2 27 06/17/2014 0530   GLUCOSE 91 06/17/2014 0530   BUN 7 06/17/2014 0530   CREATININE 3.14* 06/17/2014 0530   CREATININE 5.50* 04/03/2014 0936   CALCIUM 8.9 06/17/2014 0530   CALCIUM 9.4 03/14/2014 0932   GFRNONAA 20* 06/17/2014 0530   GFRAA 24* 06/17/2014 0530     Assessment/Plan: 9 Days Post-Op   Principal Problem:   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 Active Problems:   Thrombocytopenia   Acute respiratory failure   Renal transplant disorder   Cardiomyopathy   Atrial flutter   Ulcer of left lower leg   Chronic systolic CHF (congestive heart failure)   CKD (chronic kidney disease)   Hypertension   History of renal transplant   Osteomyelitis   Hepatitis C   Hyperparathyroidism due to renal insufficiency   Shock   Arterial hypotension   ESRD (end stage renal disease)   ESRD on dialysis   Acute blood loss anemia   Advance diet Up with therapy Continue plan per Dr Carola Frost and internal medicine Plan for SNF on  Monday   Haskel Khan 06/17/2014, 10:24 AM  Discussed and agree with above.   Teryl Lucy, MD Cell [phone]

## 2014-06-17 NOTE — Progress Notes (Signed)
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 Active Problems:   Thrombocytopenia   Acute respiratory failure   Renal transplant disorder   Cardiomyopathy   Atrial flutter   Ulcer of left lower leg   Chronic systolic CHF (congestive heart failure)   CKD (chronic kidney disease)   Hypertension   History of renal transplant   Osteomyelitis   Hepatitis C   Hyperparathyroidism due to renal insufficiency   Shock   Arterial hypotension   ESRD (end stage renal disease)   ESRD on dialysis   Acute blood loss anemia  Abdominal pain  - resolved, no further loose BMs - add Florastor since he will be on Abx for weeks   Hypotension - Multifactorial including sepsis, post anesthetic effect, and possible adrenal insufficiency given outpatient prednisone - BP normal now - Has been weaned off of vasopressors, out of ICD since 5/14 - continue to hold home meds imdur, amlodipine and hydralazine;  - stable, would be OK to discharge without amlodipine and hydralazine for now, these can be restarted as an outpatient  Osteomyelitis left femur - Status post I&D 06/08/2014, Dr. Carola Frost - Wound care per orthopedics - Antibiotics per infectious disease, on Vancomycin and Elita Quick which are given with HD - wound culture +proteus, Blood cultures remain negative, wbc improving - Patient ultimately plans to go back home after this hospitalization--he does not want to go to skilled nursing facility - placement pending, antibiotics per ID, with HD, no need for PICC line; patient tells me today he does not want the surgery; Insurance denied CIR and he is planned for SNF on Monday   Chronic systolic CHF/nonischemic cardiomyopathy - Previously had EF as low as 15-20% - Follows  Dr. [name] - Most recent echocardiogram 06/13/2013 showed EF 60%, on low dose coreg only due to low bp,  - volume managed by HD - on room air now.  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 - Currently in sinus rhythm   Anemia of CKD - Patient's baseline hemoglobin has had wide variations but baseline seems to be around 11-12 - Drop in hemoglobin likely dilutional and surgery, now stable - Aranesp per nephrology  Hyperlipidemia - Continue pravastatin   He was not discharged today, will be here until Monday, has more abdominal pain, medicine will follow through the weekend.    Code Status: full Family Communication: patient  Disposition Plan: per primary team    HPI 59 year old male with a history of nonischemic cardiomyopathy, paroxysmal atrial flutter not on anticoagulation secondary to GI bleed, ESRD (MWF) with failed  renal transplant (1996), hepatitis C, and osteomyelitis of the left femur. The patient initially had a distal femur fracture in June 2014 and underwent ORIF at that time. He subsequently developed a wound infection and underwent irrigation and debridement. He had been following infectious disease. On 03/14/2014, the patient had his hardware removed  and had placement of antibiotic beads. He was seen in the orthopedic office on 06/07/2014. He was noted to have purulent drainage. He was admitted on 06/08/2014 for repeat irrigation and debridement. Postoperatively he was extubated successfully. Unfortunately, the patient developed hypotension that was refractory to 3 doses of colloid infusion. Critical care was consulted, and the patient was transferred to the ICU where he was placed on vasopressors. It was thought that the patient was possibly septic secondary to his wound infection and osteomyelitis as well as residual effects from his anesthesia. He was initially placed on stress steroids which have been weaned to his baseline dose. He has been since weaned off of his vasopressors and was transferred out of the ICU on 06/10/2014. As a result, internal medicine consultation was obtained for medical management. During this hospitalization, nephrology has been consulted for his dialysis needs. In addition, infectious disease has been following for his antibiotic regimen.   Procedures:  HD  I/d left thigh 5/12  Antibiotics:  Rocephin  Vancomycin  Objective: BP 107/70 mmHg  Pulse 88  Temp(Src) 98.8 F (37.1 C) (Oral)  Resp 17  Ht 6' (1.829 m)  Wt 104.1 kg (229 lb 8 oz)  BMI 31.12 kg/m2  SpO2 96%  Intake/Output Summary (Last 24 hours) at 06/17/14 1152 Last data filed at 06/17/14 0500  Gross per 24 hour  Intake    120 ml  Output     75 ml  Net     45 ml   Filed Weights   06/14/14 1332 06/16/14 0659 06/16/14 1042  Weight: 108.4 kg (238 lb 15.7 oz) 106.8 kg (235 lb 7.2 oz) 104.1 kg (229 lb 8 oz)   Exam:  General:  NAD  Cardiovascular: RRR  Respiratory: CTABL  Abdomen: Soft/ non-tender to palpation, BS+  Musculoskeletal: post op changes left thigh  Data Reviewed: Basic Metabolic Panel:  Recent Labs Lab 06/12/14 0500 06/16/14 0724 06/17/14 0530  NA 132* 132* 130*  K 3.6 3.8 4.8  CL 96* 97* 94*  CO2 24 26 27     GLUCOSE 87 82 91  BUN 32* 14 7  CREATININE 4.31* 3.74* 3.14*  CALCIUM 8.2* 8.9 8.9  PHOS 3.7 3.6  --    Liver Function Tests:  Recent Labs Lab 06/12/14 0500 06/16/14 0724  ALBUMIN 2.2* 2.1*   CBC:  Recent Labs Lab 06/12/14 0500 06/16/14 0723 06/17/14 0530  WBC 14.3* 13.2* 15.3*  HGB 8.4* 8.6* 9.2*  HCT 25.9* 28.1* 29.1*  MCV 91.8 95.3 97.3  PLT 173 184 164   CBG:  Recent Labs Lab 06/10/14 2151 06/11/14 0630 06/11/14 1157 06/11/14 1625 06/11/14 2207  GLUCAP 115* 88 77 91 107*    Recent Results (from the past 240 hour(s))  Anaerobic culture     Status: None   Collection Time: 06/08/14 11:55 AM  Result Value Ref Range Status   Specimen Description TISSUE THIGH LEFT  Final   Special Requests NONE  Final   Gram Stain   Final    ABUNDANT WBC PRESENT,BOTH PMN AND MONONUCLEAR ABUNDANT GRAM POSITIVE COCCI IN CLUSTERS IN PAIRS Performed at [facility] Gram Stain Report Called to,Read Back By and Verified With:  Gram Stain Report Called to,Read Back By and Verified With: Mendel Corning RN 1320 06/08/14 [place] WILSONM Performed at [facility]    Culture   Final    NO ANAEROBES ISOLATED Performed at [facility]    Report Status 06/13/2014 FINAL  Final  Gram stain     Status: None   Collection Time: 06/08/14 11:55 AM  Result Value Ref Range Status   Specimen Description TISSUE THIGH LEFT  Final   Special Requests NONE  Final   Gram Stain   Final    ABUNDANT WBC PRESENT,BOTH PMN AND MONONUCLEAR ABUNDANT GRAM POSITIVE COCCI IN PAIRS IN CLUSTERS Gram Stain Report Called to,Read Back By and Verified With: Mendel Corning RN 13:20 06/08/14 ([email or web address])    Report Status 06/08/2014 FINAL  Final  Tissue culture     Status: None   Collection Time: 06/08/14 11:55 AM  Result Value Ref Range Status   Specimen Description TISSUE THIGH LEFT  Final   Special Requests NONE  Final   Gram Stain   Final    ABUNDANT WBC PRESENT,BOTH PMN AND MONONUCLEAR ABUNDANT GRAM  POSITIVE COCCI IN CLUSTERS IN PAIRS Performed at [facility] Gram Stain Report Called to,Read Back By and Verified With: Gram Stain Report Called to,Read Back By and Verified With: Mendel Corning RN 1320 06/08/14 [place] WILSONM Performed at [facility]    Culture   Final    MODERATE PROTEUS MIRABILIS Performed at [facility]    Report Status 06/11/2014 FINAL  Final   Organism ID, Bacteria PROTEUS MIRABILIS  Final      Susceptibility   Proteus mirabilis - MIC*    AMPICILLIN >=32 RESISTANT Resistant     AMPICILLIN/SULBACTAM >=32 RESISTANT Resistant     CEFAZOLIN >=64 RESISTANT Resistant     CEFEPIME <=1 SENSITIVE Sensitive     CEFTAZIDIME 4 SENSITIVE Sensitive     CEFTRIAXONE 4 SENSITIVE Sensitive     CIPROFLOXACIN <=0.25 SENSITIVE Sensitive     GENTAMICIN <=1 SENSITIVE Sensitive     IMIPENEM 2 SENSITIVE Sensitive     PIP/TAZO <=4 SENSITIVE Sensitive     TOBRAMYCIN <=1 SENSITIVE Sensitive     TRIMETH/SULFA <=20 SENSITIVE Sensitive     * MODERATE PROTEUS MIRABILIS  Anaerobic culture     Status: None   Collection Time: 06/08/14  1:58 PM  Result Value Ref Range Status   Specimen Description TISSUE  Final   Special Requests REAMINGS OF LEFT FEMUR PT ON ANCEF AND ZOSYN  Final   Gram Stain   Final    ABUNDANT WBC PRESENT,BOTH PMN AND MONONUCLEAR NO ORGANISMS SEEN Performed at [facility] Performed at [facility]    Culture   Final    NO ANAEROBES ISOLATED Performed at [facility]    Report Status 06/13/2014 FINAL  Final  Gram stain     Status: None   Collection Time: 06/08/14  1:58 PM  Result Value Ref Range Status   Specimen Description TISSUE  Final   Special Requests REAMINGS OF LEFT FEMUR  Final   Gram Stain   Final    ABUNDANT WBC PRESENT,BOTH PMN AND MONONUCLEAR NO ORGANISMS SEEN    Report Status 06/08/2014 FINAL  Final  Tissue culture     Status: None   Collection Time: 06/08/14  1:58 PM  Result Value Ref Range Status    Specimen Description TISSUE  Final   Special Requests REAMINGS OF LEFT FEMUR  Final   Gram  Stain   Final    ABUNDANT WBC PRESENT,BOTH PMN AND MONONUCLEAR NO ORGANISMS SEEN Performed at [facility] Performed at [facility]    Culture   Final    NO GROWTH 3 DAYS Performed at [facility]    Report Status 06/11/2014 FINAL  Final  MRSA PCR Screening     Status: Abnormal   Collection Time: 06/08/14  6:15 PM  Result Value Ref Range Status   MRSA by PCR POSITIVE (A) NEGATIVE Final    Comment:        The GeneXpert MRSA Assay (FDA approved for NASAL specimens only), is one component of a comprehensive MRSA colonization surveillance program. It is not intended to diagnose MRSA infection nor to guide or monitor treatment for MRSA infections. RESULT CALLED TO, READ BACK BY AND VERIFIED WITH: RN,GREG HARDUK [id number] @2121  THANEY   Culture, blood (routine x 2)     Status: None   Collection Time: 06/08/14  7:08 PM  Result Value Ref Range Status   Specimen Description BLOOD RIGHT ANTECUBITAL  Final   Special Requests   Final    BOTTLES DRAWN AEROBIC AND ANAEROBIC 5CCBLUE 3CC RED   Culture   Final    NO GROWTH 5 DAYS Note: Culture results may be compromised due to an inadequate volume of blood received in culture bottles. Performed at [facility]    Report Status 06/15/2014 FINAL  Final  Culture, blood (routine x 2)     Status: None   Collection Time: 06/08/14  7:20 PM  Result Value Ref Range Status   Specimen Description BLOOD RIGHT HAND  Final   Special Requests BOTTLES DRAWN AEROBIC ONLY  Final   Culture   Final    NO GROWTH 5 DAYS Performed at [facility]    Report Status 06/15/2014 FINAL  Final  Urine culture     Status: None   Collection Time: 06/09/14  7:03 AM  Result Value Ref Range Status   Specimen Description URINE, RANDOM  Final   Special Requests NONE  Final   Colony Count NO GROWTH Performed at [facility]    Final   Culture NO GROWTH Performed at [facility]   Final   Report Status 06/10/2014 FINAL  Final     Studies: Dg Abd Portable 1v  06/16/2014   CLINICAL DATA:  Abdominal pain at dialysis today.  EXAM: PORTABLE ABDOMEN - 1 VIEW  COMPARISON:  07/24/2012  FINDINGS: The bowel gas pattern is normal. No radio-opaque calculi or other significant radiographic abnormality are seen in the abdomen.  The heart is mildly enlarged. Patient has dialysis catheter projecting over the right atrium. There is patchy density at the lung bases consistent with atelectasis or early infiltrates.  IMPRESSION: 1. Nonobstructive bowel gas pattern. 2. Atelectasis or early infiltrates in the lung bases.   Electronically Signed   By: Norva Pavlov M.D.   On: 06/16/2014 18:17    Scheduled Meds: . allopurinol  100 mg Oral BID  . amiodarone  100 mg Oral Daily  . aspirin EC  325 mg Oral Daily  . calcium acetate  667 mg Oral TID WC  . carvedilol  3.125 mg Oral BID WC  . cefTAZidime (FORTAZ)  IV  2 g Intravenous Q M,W,F-HD  . darbepoetin (ARANESP) injection - DIALYSIS  100 mcg Intravenous Q Mon-HD  . docusate sodium  100 mg Oral BID  . doxercalciferol  1 mcg Intravenous Q M,W,F-HD  .  heparin subcutaneous  5,000 Units Subcutaneous 3 times per day  . multivitamin  1 tablet Oral QHS  . pravastatin  40 mg Oral q1800  . predniSONE  5 mg Oral Q breakfast  . vancomycin  1,000 mg Intravenous Q M,W,F-HD    Continuous Infusions: . sodium chloride 10 mL/hr at 06/08/14 1035    Pamella Pert MD  [name] Pager [phone]. If 7PM-7AM, please contact night-coverage at [email or web address], password [facility] 06/17/2014, 11:52 AM  LOS: 9 days

## 2014-06-17 NOTE — Progress Notes (Signed)
[facility] Progress Note  Assessment/Plan: 1. Left distal femur osteo s/p I and D with beads, wound culture + (office culture showed MRSA, inpatient culture grew Proteus), on Vanc and Fortaz; NWB Left leg; s/p septic shock; - please specify duration of antibioitc tx at discharge - plan  6 weeks total of Vanc and Fortaz to be given at his dialysis center on MWF (through 6/22) 2. ESRD/ hx of failed transplant - MWF K 3.8 on 4 K bath Friday /tapering prednisone, gradually lowering EDW Na low- plan HD first round Monday in anticipation of D/C to SNF- orders written 3. Anemia - Hgb 8.6 up to 9.2 post HD Sat stable Aranesp 100 last 5/16 26% sat - hold on adding Fe for now - resume after d/c 4. Secondary hyperparathyroidism - controlled with Hectorol and phoslo 5. BP- low/volume - controlled, coreg decreased to 3.125 bid, other meds on hold; have been titrating volume as BP allows; net UF 3.4 with post wt of 104.1 on Friday 6. Nutrition - alb 2.1 7. Disp - plan d/c to [facility] on Monday  Sheffield Slider, PA-C [facility] Beeper [phone] 06/17/2014,10:29 AM  LOS: 9 days   Pt seen, examined and agree w A/P as above.  Vinson Moselle MD pager [phone]    cell [phone] 06/18/2014, 10:02 AM    Subjective:   Stomach felt bad during HD yesterday towards the end, tired post HD but couldn't sleep due to interuptions - took some Tums for stomach. Feels fine today.  Objective Filed Vitals:   06/16/14 1200 06/16/14 1300 06/16/14 2108 06/17/14 0602  BP: 100/68 86/53 91/54  107/70  Pulse: 99 91 88 88  Temp: 97.9 F (36.6 C) 98.8 F (37.1 C) 99 F (37.2 C) 98.8 F (37.1 C)  TempSrc: Oral Oral Oral   Resp: 16 16 16 17   Height:      Weight:      SpO2: 96% 98% 98% 96%   Physical Exam General: NAD sitting on side of the bed Heart: RRR Lungs: no rales Abdomen: obese Extremities: bilateral LE edema, LE wrapped so difficult to quantify edema/ chronic  skin changes, dressing on L leg Dialysis Access: left IJ and maturing left upper AVF + bruit  Dialysis Orders: [facility]WF [facility] 4hr 30 mins 103.5kgs 2K/2.5ca 5000u heparin Hectorol 1  Additional Objective Labs: Basic Metabolic Panel:  Recent Labs Lab 06/12/14 0500 06/16/14 0724 06/17/14 0530  NA 132* 132* 130*  K 3.6 3.8 4.8  CL 96* 97* 94*  CO2 24 26 27   GLUCOSE 87 82 91  BUN 32* 14 7  CREATININE 4.31* 3.74* 3.14*  CALCIUM 8.2* 8.9 8.9  PHOS 3.7 3.6  --    Liver Function Tests:  Recent Labs Lab 06/12/14 0500 06/16/14 0724  ALBUMIN 2.2* 2.1*   CBC:  Recent Labs Lab 06/12/14 0500 06/16/14 0723 06/17/14 0530  WBC 14.3* 13.2* 15.3*  HGB 8.4* 8.6* 9.2*  HCT 25.9* 28.1* 29.1*  MCV 91.8 95.3 97.3  PLT 173 184 164   CBG:  Recent Labs Lab 06/10/14 2151 06/11/14 0630 06/11/14 1157 06/11/14 1625 06/11/14 2207  GLUCAP 115* 88 77 91 107*   Iron Studies:  Recent Labs  06/16/14 0445  IRON 45  TIBC 172*   Studies/Results: Dg Abd Portable 1v  06/16/2014   CLINICAL DATA:  Abdominal pain at dialysis today.  EXAM: PORTABLE ABDOMEN - 1 VIEW  COMPARISON:  07/24/2012  FINDINGS: The bowel gas pattern is normal. No radio-opaque calculi  or other significant radiographic abnormality are seen in the abdomen.  The heart is mildly enlarged. Patient has dialysis catheter projecting over the right atrium. There is patchy density at the lung bases consistent with atelectasis or early infiltrates.  IMPRESSION: 1. Nonobstructive bowel gas pattern. 2. Atelectasis or early infiltrates in the lung bases.   Electronically Signed   By: Norva Pavlov M.D.   On: 06/16/2014 18:17   Medications: . sodium chloride 10 mL/hr at 06/08/14 1035   . allopurinol  100 mg Oral BID  . amiodarone  100 mg Oral Daily  . aspirin EC  325 mg Oral Daily  . calcium acetate  667 mg Oral TID WC  . carvedilol  3.125 mg Oral BID WC  . cefTAZidime (FORTAZ)  IV  2 g Intravenous Q M,W,F-HD  . darbepoetin  (ARANESP) injection - DIALYSIS  100 mcg Intravenous Q Mon-HD  . docusate sodium  100 mg Oral BID  . doxercalciferol  1 mcg Intravenous Q M,W,F-HD  . heparin subcutaneous  5,000 Units Subcutaneous 3 times per day  . multivitamin  1 tablet Oral QHS  . pravastatin  40 mg Oral q1800  . predniSONE  5 mg Oral Q breakfast  . vancomycin  1,000 mg Intravenous Q M,W,F-HD

## 2014-06-18 MED ORDER — PRO-STAT SUGAR FREE PO LIQD
30.0000 mL | Freq: Two times a day (BID) | ORAL | Status: DC
  Administered 2014-06-18 (×2): 30 mL via ORAL
  Filled 2014-06-18 (×4): qty 30

## 2014-06-18 NOTE — Progress Notes (Signed)
[facility] Progress Note  Assessment/Plan: 1. Left distal femur osteo s/p I and D with beads, wound culture + (office culture showed MRSA, inpatient culture grew Proteus), on Vanc and Fortaz; NWB Left leg; s/p septic shock; - plan 6 weeks total of Vanc and Fortaz to be given at his dialysis center on MWF (through 6/22) 2. ESRD/ hx of failed transplant - MWF K 3.8 on 4 K bath Friday /tapering prednisone, gradually lowering EDW Na low- plan HD first round Monday in anticipation of D/C to SNF- orders written 3. Anemia - Hgb 8.6 up to 9.2 post HD Sat stable Aranesp 100 last 5/16 26% sat - hold on adding Fe for now - resume after d/c 4. Secondary hyperparathyroidism - controlled with Hectorol and phoslo 5. BP- low/volume - controlled, coreg decreased to 3.125 bid, other meds on hold; have been titrating volume as BP allows; net UF 3.4 with post wt of 104.1 on Friday 6. Nutrition - alb 2.1 - added prostat to renal diet 7. Disp - plan d/c to [facility] on Monday  Sheffield Slider, PA-C [facility] Beeper [phone] 06/18/2014,9:02 AM  LOS: 10 days   Pt seen, examined and agree w A/P as above.  Vinson Moselle MD pager [phone]    cell [phone] 06/18/2014, 12:39 PM    Subjective:   No new issues.  Feels fine. Ate 100%  Objective Filed Vitals:   06/17/14 0602 06/17/14 1305 06/17/14 1948 06/18/14 0424  BP: 107/70 100/62 93/63 104/60  Pulse: 88 89 82 73  Temp: 98.8 F (37.1 C) 99.1 F (37.3 C) 99 F (37.2 C) 97.9 F (36.6 C)  TempSrc:  Oral Oral Oral  Resp: 17 18 18 18   Height:      Weight:      SpO2: 96% 100% 96% 95%   Physical Exam General: NAD talking on the phone Heart: RRR Lungs: clear without rales, dim bases Abdomen: soft Extremities: bilateral LE edema, wrapped LE make it difficulty to quantify edema, chornic skin changes, dressing left leg Dialysis Access: left IJ and maturing left AVF + bruit  Dialysis Orders: [facility]WF [facility] 4hr 30  mins 103.5kgs 2K/2.5ca 5000u heparin Hectorol 1   Additional Objective Labs: Basic Metabolic Panel:  Recent Labs Lab 06/12/14 0500 06/16/14 0724 06/17/14 0530  NA 132* 132* 130*  K 3.6 3.8 4.8  CL 96* 97* 94*  CO2 24 26 27   GLUCOSE 87 82 91  BUN 32* 14 7  CREATININE 4.31* 3.74* 3.14*  CALCIUM 8.2* 8.9 8.9  PHOS 3.7 3.6  --    Liver Function Tests:  Recent Labs Lab 06/12/14 0500 06/16/14 0724  ALBUMIN 2.2* 2.1*   CBC:  Recent Labs Lab 06/12/14 0500 06/16/14 0723 06/17/14 0530  WBC 14.3* 13.2* 15.3*  HGB 8.4* 8.6* 9.2*  HCT 25.9* 28.1* 29.1*  MCV 91.8 95.3 97.3  PLT 173 184 164   Blood Culture    Component Value Date/Time   SDES URINE, RANDOM 06/09/2014 0703   SPECREQUEST NONE 06/09/2014 0703   CULT NO GROWTH Performed at [facility]  06/09/2014 0703   REPTSTATUS 06/10/2014 FINAL 06/09/2014 0703  CBG:  Recent Labs Lab 06/11/14 1157 06/11/14 1625 06/11/14 2207  GLUCAP 77 91 107*   Iron Studies:  Recent Labs  06/16/14 0445  IRON 45  TIBC 172*   @[email or web address] @ Studies/Results: Dg Abd Portable 1v  06/16/2014   CLINICAL DATA:  Abdominal pain at dialysis today.  EXAM: PORTABLE ABDOMEN - 1 VIEW  COMPARISON:  07/24/2012  FINDINGS: The bowel gas pattern is normal. No radio-opaque calculi or other significant radiographic abnormality are seen in the abdomen.  The heart is mildly enlarged. Patient has dialysis catheter projecting over the right atrium. There is patchy density at the lung bases consistent with atelectasis or early infiltrates.  IMPRESSION: 1. Nonobstructive bowel gas pattern. 2. Atelectasis or early infiltrates in the lung bases.   Electronically Signed   By: Norva Pavlov M.D.   On: 06/16/2014 18:17   Medications: . sodium chloride 10 mL/hr at 06/08/14 1035   . allopurinol  100 mg Oral BID  . amiodarone  100 mg Oral Daily  . aspirin EC  325 mg Oral Daily  . calcium acetate  667 mg Oral TID WC  . carvedilol  3.125 mg  Oral BID WC  . cefTAZidime (FORTAZ)  IV  2 g Intravenous Q M,W,F-HD  . darbepoetin (ARANESP) injection - DIALYSIS  100 mcg Intravenous Q Mon-HD  . docusate sodium  100 mg Oral BID  . doxercalciferol  1 mcg Intravenous Q M,W,F-HD  . heparin subcutaneous  5,000 Units Subcutaneous 3 times per day  . multivitamin  1 tablet Oral QHS  . pravastatin  40 mg Oral q1800  . predniSONE  5 mg Oral Q breakfast  . saccharomyces boulardii  250 mg Oral BID  . vancomycin  1,000 mg Intravenous Q M,W,F-HD

## 2014-06-18 NOTE — Progress Notes (Signed)
Orthopedic Tech Progress Note Patient Details:  Chad [name] 08-31-1955 [id number] Applied bilateral Unna boots.  Bilateral pulses, sensation, motion intact before and after application.  Bilateral capillary refill less than 2 seconds before and after application.  Approximately 2cm long skin tear noted to Rt. anterior leg, midline, just distal to patella after removing existing Unna boot.  Skin tear oozing trickle of bloody serous fluid.  Applied Telfa dressing to skin tear, then applied [employer].  Ortho Devices Type of Ortho Device: Radio broadcast assistant Ortho Device/Splint Location: Bilateral lower extremeties Ortho Device/Splint Interventions: Application   [name] 06/18/2014, 8:46 PM

## 2014-06-18 NOTE — Progress Notes (Signed)
Patient ID: Chad [name], male   DOB: 06-21-1955, 59 y.o.   MRN: [id number]     Subjective:  Patient reports pain as mild.  Patient doing okay just waiting on SNF placement  Objective:   VITALS:   Filed Vitals:   06/17/14 0602 06/17/14 1305 06/17/14 1948 06/18/14 0424  BP: 107/70 100/62 93/63 104/60  Pulse: 88 89 82 73  Temp: 98.8 F (37.1 C) 99.1 F (37.3 C) 99 F (37.2 C) 97.9 F (36.6 C)  TempSrc:  Oral Oral Oral  Resp: 17 18 18 18   Height:      Weight:      SpO2: 96% 100% 96% 95%    ABD soft Sensation intact distally Dorsiflexion/Plantar flexion intact Incision: dressing C/D/I and no drainage   Lab Results  Component Value Date   WBC 15.3* 06/17/2014   HGB 9.2* 06/17/2014   HCT 29.1* 06/17/2014   MCV 97.3 06/17/2014   PLT 164 06/17/2014   BMET    Component Value Date/Time   NA 130* 06/17/2014 0530   K 4.8 06/17/2014 0530   CL 94* 06/17/2014 0530   CO2 27 06/17/2014 0530   GLUCOSE 91 06/17/2014 0530   BUN 7 06/17/2014 0530   CREATININE 3.14* 06/17/2014 0530   CREATININE 5.50* 04/03/2014 0936   CALCIUM 8.9 06/17/2014 0530   CALCIUM 9.4 03/14/2014 0932   GFRNONAA 20* 06/17/2014 0530   GFRAA 24* 06/17/2014 0530     Assessment/Plan: 10 Days Post-Op   Principal Problem:   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 Active Problems:   Thrombocytopenia   Acute respiratory failure   Renal transplant disorder   Cardiomyopathy   Atrial flutter   Ulcer of left lower leg   Chronic systolic CHF (congestive heart failure)   CKD (chronic kidney disease)   Hypertension   History of renal transplant   Osteomyelitis   Hepatitis C   Hyperparathyroidism due to renal insufficiency   Shock   Arterial hypotension   ESRD (end stage renal disease)   ESRD on dialysis   Acute blood loss anemia   Advance diet Up with therapy Plan for discharge tomorrow Plan for SNF   Haskel Khan 06/18/2014, 7:53 AM  Discussed  and agree with above.  Teryl Lucy, MD Cell [phone]

## 2014-06-18 NOTE — Progress Notes (Addendum)
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 Active Problems:   Thrombocytopenia   Acute respiratory failure   Renal transplant disorder   Cardiomyopathy   Atrial flutter   Ulcer of left lower leg   Chronic systolic CHF (congestive heart failure)   CKD (chronic kidney disease)   Hypertension   History of renal transplant   Osteomyelitis   Hepatitis C   Hyperparathyroidism due to renal insufficiency   Shock   Arterial hypotension   ESRD (end stage renal disease)   ESRD on dialysis   Acute blood loss anemia  Abdominal pain  - resolved, no further loose BMs - add Florastor since he will be on Abx for weeks   Hypotension - Multifactorial including sepsis, post anesthetic effect, and possible adrenal insufficiency given outpatient prednisone - BP normal now - Has been weaned off of vasopressors, out of ICD since 5/14 - continue to hold home meds imdur, amlodipine and hydralazine;  - stable, would be OK to discharge without amlodipine and hydralazine for now, these can be restarted as an outpatient  Osteomyelitis left femur - Status post I&D 06/08/2014, Dr. Carola Frost - Wound care per orthopedics - Antibiotics per infectious disease, on Vancomycin and Elita Quick which are given with HD - wound culture +proteus, Blood cultures remain negative, wbc improving - Patient ultimately plans to go back home after this hospitalization--he does not want to go to skilled nursing facility - placement pending, antibiotics per ID, with HD, no need for PICC line; patient tells me today he does not want the surgery; Insurance denied CIR and he is planned for SNF on Monday   Chronic systolic CHF/nonischemic cardiomyopathy - Previously had EF as low as 15-20% - Follows Dr.  [name] - Most recent echocardiogram 06/13/2013 showed EF 60%, on low dose coreg only due to low bp,  - volume managed by HD - on room air now.  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 - Currently in sinus rhythm   Anemia of CKD - Patient's baseline hemoglobin has had wide variations but baseline seems to be around 11-12 - Drop in hemoglobin likely dilutional and surgery, now stable - Aranesp per nephrology  Hyperlipidemia - Continue pravastatin   Code Status: full Family Communication: patient  Disposition Plan: per primary team  HPI 59 year old male with a history of nonischemic cardiomyopathy, paroxysmal atrial flutter not on anticoagulation secondary to GI bleed, ESRD (MWF) with failed renal transplant (1996), hepatitis C, and osteomyelitis of the left femur. The patient initially had a distal femur fracture in June 2014 and underwent ORIF at that  time. He subsequently developed a wound infection and underwent irrigation and debridement. He had been following infectious disease. On 03/14/2014, the patient had his hardware removed and had placement of antibiotic beads. He was seen in the orthopedic office on 06/07/2014. He was noted to have purulent drainage. He was  admitted on 06/08/2014 for repeat irrigation and debridement. Postoperatively he was extubated successfully. Unfortunately, the patient developed hypotension that was refractory to 3 doses of colloid infusion. Critical care was consulted, and the patient was transferred to the ICU where he was placed on vasopressors. It was thought that the patient was possibly septic secondary to his wound infection and osteomyelitis as well as residual effects from his anesthesia. He was initially placed on stress steroids which have been weaned to his baseline dose. He has been since weaned off of his vasopressors and was transferred out of the ICU on 06/10/2014. As a result, internal medicine consultation was obtained for medical management. During this hospitalization, nephrology has been consulted for his dialysis needs. In addition, infectious disease has been following for his antibiotic regimen.   Procedures:  HD  I/d left thigh 5/12  Antibiotics:  Rocephin  Vancomycin  Objective: BP 104/60 mmHg  Pulse 73  Temp(Src) 97.9 F (36.6 C) (Oral)  Resp 18  Ht 6' (1.829 m)  Wt 104.1 kg (229 lb 8 oz)  BMI 31.12 kg/m2  SpO2 95%  Intake/Output Summary (Last 24 hours) at 06/18/14 1226 Last data filed at 06/18/14 0900  Gross per 24 hour  Intake  177.3 ml  Output      0 ml  Net  177.3 ml   Filed Weights   06/14/14 1332 06/16/14 0659 06/16/14 1042  Weight: 108.4 kg (238 lb 15.7 oz) 106.8 kg (235 lb 7.2 oz) 104.1 kg (229 lb 8 oz)   Exam:  General:  NAD  Data Reviewed: Basic Metabolic Panel:  Recent Labs Lab 06/12/14 0500 06/16/14 0724 06/17/14 0530  NA 132* 132* 130*  K 3.6 3.8 4.8  CL 96* 97* 94*  CO2 GLUCOSE 87 82 91  BUN 32* 14 7  CREATININE 4.31* 3.74* 3.14*  CALCIUM 8.2* 8.9 8.9  PHOS 3.7 3.6  --    Liver Function Tests:  Recent Labs Lab 06/12/14 0500 06/16/14 0724  ALBUMIN 2.2* 2.1*   CBC:  Recent Labs Lab 06/12/14 0500 06/16/14 0723 06/17/14 0530  WBC  14.3* 13.2* 15.3*  HGB 8.4* 8.6* 9.2*  HCT 25.9* 28.1* 29.1*  MCV 91.8 95.3 97.3  PLT 173 184 164   CBG:  Recent Labs Lab 06/11/14 1625 06/11/14 2207  GLUCAP 91 107*    Recent Results (from the past 240 hour(s))  Anaerobic culture     Status: None   Collection Time: 06/08/14  1:58 PM  Result Value Ref Range Status   Specimen Description TISSUE  Final   Special Requests REAMINGS OF LEFT FEMUR PT ON ANCEF AND ZOSYN  Final   Gram Stain   Final    ABUNDANT WBC PRESENT,BOTH PMN AND MONONUCLEAR NO ORGANISMS SEEN Performed at [facility] Performed at [facility]    Culture   Final    NO ANAEROBES ISOLATED Performed at [facility]    Report Status 06/13/2014 FINAL  Final  Gram stain     Status: None   Collection Time: 06/08/14  1:58 PM  Result Value Ref Range Status   Specimen Description TISSUE  Final   Special Requests REAMINGS OF LEFT  FEMUR  Final   Gram Stain   Final    ABUNDANT WBC PRESENT,BOTH PMN AND MONONUCLEAR NO ORGANISMS SEEN    Report Status 06/08/2014 FINAL  Final  Tissue culture     Status: None   Collection Time: 06/08/14  1:58 PM  Result Value Ref Range Status   Specimen Description TISSUE  Final   Special Requests REAMINGS OF LEFT FEMUR  Final   Gram Stain   Final    ABUNDANT WBC PRESENT,BOTH PMN AND MONONUCLEAR NO ORGANISMS SEEN Performed at [facility] Performed at [facility]    Culture   Final    NO GROWTH 3 DAYS Performed at [facility]    Report Status 06/11/2014 FINAL  Final  MRSA PCR Screening     Status: Abnormal   Collection Time: 06/08/14  6:15 PM  Result Value Ref Range Status   MRSA by PCR POSITIVE (A) NEGATIVE Final    Comment:        The GeneXpert MRSA Assay (FDA approved for NASAL specimens only), is one component of a comprehensive MRSA colonization surveillance program. It is not intended to diagnose MRSA infection nor to guide or monitor treatment for MRSA  infections. RESULT CALLED TO, READ BACK BY AND VERIFIED WITH: RN,GREG HARDUK [id number]  THANEY   Culture, blood (routine x 2)     Status: None   Collection Time: 06/08/14  7:08 PM  Result Value Ref Range Status   Specimen Description BLOOD RIGHT ANTECUBITAL  Final   Special Requests   Final    BOTTLES DRAWN AEROBIC AND ANAEROBIC 5CCBLUE 3CC RED   Culture   Final    NO GROWTH 5 DAYS Note: Culture results may be compromised due to an inadequate volume of blood received in culture bottles. Performed at [facility]    Report Status 06/15/2014 FINAL  Final  Culture, blood (routine x 2)     Status: None   Collection Time: 06/08/14  7:20 PM  Result Value Ref Range Status   Specimen Description BLOOD RIGHT HAND  Final   Special Requests BOTTLES DRAWN AEROBIC ONLY  Final   Culture   Final    NO GROWTH 5 DAYS Performed at [facility]    Report Status 06/15/2014 FINAL  Final  Urine culture     Status: None   Collection Time: 06/09/14  7:03 AM  Result Value Ref Range Status   Specimen Description URINE, RANDOM  Final   Special Requests NONE  Final   Colony Count NO GROWTH Performed at [facility]   Final   Culture NO GROWTH Performed at [facility]   Final   Report Status 06/10/2014 FINAL  Final     Studies: No results found.  Scheduled Meds: . allopurinol  100 mg Oral BID  . amiodarone  100 mg Oral Daily  . aspirin EC  325 mg Oral Daily  . calcium acetate  667 mg Oral TID WC  . carvedilol  3.125 mg Oral BID WC  . cefTAZidime (FORTAZ)  IV  2 g Intravenous Q M,W,F-HD  . darbepoetin (ARANESP) injection - DIALYSIS  100 mcg Intravenous Q Mon-HD  . docusate sodium  100 mg Oral BID  . doxercalciferol  1 mcg Intravenous Q M,W,F-HD  . feeding supplement (PRO-STAT SUGAR FREE 64)  30 mL Oral BID  . heparin subcutaneous  5,000 Units Subcutaneous 3 times per day  . multivitamin  1 tablet Oral QHS  .  pravastatin  40 mg Oral q1800  . predniSONE   5 mg Oral Q breakfast  . saccharomyces boulardii  250 mg Oral BID  . vancomycin  1,000 mg Intravenous Q M,W,F-HD    Continuous Infusions: . sodium chloride 10 mL/hr at 06/08/14 1035    Pamella Pert MD  [name] Pager [phone]. If 7PM-7AM, please contact night-coverage at [email or web address], password [facility] 06/18/2014, 12:26 PM  LOS: 10 days

## 2014-06-19 LAB — CBC
HCT: 28.8 % — ABNORMAL LOW (ref 39.0–52.0)
Hemoglobin: 9.1 g/dL — ABNORMAL LOW (ref 13.0–17.0)
MCH: 30.1 pg (ref 26.0–34.0)
MCHC: 31.6 g/dL (ref 30.0–36.0)
MCV: 95.4 fL (ref 78.0–100.0)
Platelets: 172 10*3/uL (ref 150–400)
RBC: 3.02 MIL/uL — ABNORMAL LOW (ref 4.22–5.81)
RDW: 19.2 % — ABNORMAL HIGH (ref 11.5–15.5)
WBC: 13.2 10*3/uL — ABNORMAL HIGH (ref 4.0–10.5)

## 2014-06-19 LAB — RENAL FUNCTION PANEL
Albumin: 2.2 g/dL — ABNORMAL LOW (ref 3.5–5.0)
Anion gap: 12 (ref 5–15)
BUN: 31 mg/dL — ABNORMAL HIGH (ref 6–20)
CO2: 25 mmol/L (ref 22–32)
Calcium: 9.6 mg/dL (ref 8.9–10.3)
Chloride: 94 mmol/L — ABNORMAL LOW (ref 101–111)
Creatinine, Ser: 5.42 mg/dL — ABNORMAL HIGH (ref 0.61–1.24)
GFR calc Af Amer: 12 mL/min — ABNORMAL LOW (ref >60)
GFR calc non Af Amer: 11 mL/min — ABNORMAL LOW (ref >60)
Glucose, Bld: 92 mg/dL (ref 65–99)
Phosphorus: 3.3 mg/dL (ref 2.5–4.6)
Potassium: 4.1 mmol/L (ref 3.5–5.1)
Sodium: 131 mmol/L — ABNORMAL LOW (ref 135–145)

## 2014-06-19 MED ORDER — SACCHAROMYCES BOULARDII 250 MG PO CAPS: 250.0000 mg | ORAL_CAPSULE | Freq: Two times a day (BID) | ORAL | Status: AC

## 2014-06-19 MED ORDER — CALCIUM CARBONATE 1250 MG/5ML PO SUSP: 1500.0000 mg | Freq: Four times a day (QID) | ORAL | Status: AC | PRN

## 2014-06-19 MED ORDER — ISOSORBIDE MONONITRATE ER 60 MG PO TB24: ORAL_TABLET | ORAL | Status: DC

## 2014-06-19 MED ORDER — DOXERCALCIFEROL 4 MCG/2ML IV SOLN
INTRAVENOUS | Status: AC
  Filled 2014-06-19: qty 2

## 2014-06-19 MED ORDER — HEPARIN SOD (PORK) LOCK FLUSH 100 UNIT/ML IV SOLN: 250.0000 [IU] | INTRAVENOUS | Status: DC | PRN

## 2014-06-19 MED ORDER — DARBEPOETIN ALFA 100 MCG/0.5ML IJ SOSY
PREFILLED_SYRINGE | INTRAMUSCULAR | Status: AC
  Filled 2014-06-19: qty 0.5

## 2014-06-19 NOTE — Discharge Summary (Signed)
Orthopaedic Trauma Service (OTS)  Patient ID: Chad [name] MRN: [id number] DOB/AGE: [birth year] 59 y.o.  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 Carola Frost  1. Partial excision, left femur for osteomyelitis. 2. Removal arthrotomy of the left knee with lavage and  evacuation for     septic arthritis. 3. Removal and replacement of antibiotic beads. 4. Stress fluoroscopy of the supracondylar femur   Discharged Condition: stable  Hospital Course:   [name] is a 59 year old black male with a highly complex medical history status post ground level fall dating back to 2014. Patient subsequently developed infected nonunion and was referred to orthopedic trauma specialist for treatment. He underwent harshly excision and hardware removal back in February 2016. He been doing fairly well however began to develop issues pertaining to his end-stage renal disease. He also began to drain from his operative wound after prolonged uneventful healing of his surgical wound. There was spontaneous drainage present. He was also started on dialysis in this time  period as well. He also was seeing Dr. Lajoyce Corners in follow-up for chronic bilateral lower extremity venous insufficiency. Patient was admitted on 06/08/2014 for the procedure noted above. Immediately postoperatively there were issues with his respiratory status. He was having pretty significant hypotension as well. As such critical care consult was obtained. We already planned to pain a renal consult and ID consult given his end-stage renal disease/dialysis needs and osteomyelitis, respectively.  Patient was fairly slow to progress. He did remain in the ICU for approximately 3 days. He was then transferred to the orthopedic floor for pain control and to begin therapies. There was some question as to whether or not he would be appropriate candidate rehabilitation. Place a consult for this. It was approximately 3-4 days before we found out he was not approved by insurance. Patient thought that he might. Able to discharge home however he was too weak to consider this. As such we did place a social work consult for skilled nursing facility evaluation. Again it did take several days for the insurance company to approve this and as such the patient was discharged on postop day 10 to skilled nursing facility.  Patient was followed regularly by the renal service, ID service and internal medicine to help address his multiple medical issues. This is greatly appreciated. Patient received several dialysis sessions during his hospital stay as well and was able to get back onto his Monday Wednesday Friday schedule. We did ultimately identifying organisms from both office cultures as well as intraoperative cultures. 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 Based on this information patient will be treated  with vancomycin and Tressie Ellis during his hemodialysis sessions for at least 6 weeks.    Mr. [name] main nonweightbearing on his left leg. Unrestricted range of motion is  permitted of his left lower extremity including his knee.   We did discuss possible need for amputation if his infection does not resolve. He does have a total hip arthroplasty proximal to his infected nonunion and it is completely plausible that there is some bacterial seeding of his prosthetic stem. I we will continue to follow him closely clinically as well as with inflammatory markers. We are hopeful that once he's completed his course of antibiotics his inflammatory markers will not spike again suggesting evidence of infection in which case we will need to give consideration to amputation.   Patient is also scheduled to have Unna boot changes weekly. These were done yesterday 06/18/2014. And will not need to be done again until 06/26/2014  Consults: ID, nephrology, rehabilitation medicine and Internal medicine  Significant Diagnostic Studies: labs:      Newer results are available. Click to view them now.        11d ago    Specimen Description TISSUE THIGH LEFT   Special Requests NONE   Gram Stain ABUNDANT WBC PRESENT,BOTH PMN AND MONONUCLEAR  ABUNDANT GRAM POSITIVE COCCI IN CLUSTERS  IN PAIRS Performed at [facility] Gram Stain Report Called to,Read Back By and Verified With: Gram Stain Report Called to,Read Back By and Verified With: Ellison Carwin YV [id number] 06/08/14 BY Margretta Ditty  Performed at [place]  Performed at [employer] [facility]       Report Status 06/11/2014 FINAL   Organism ID, Bacteria PROTEUS MIRABILIS   Resulting Agency [facility]    Culture & Susceptibility      PROTEUS MIRABILIS     Antibiotic Sensitivity Microscan Status    AMPICILLIN Resistant >=32 RESISTANT Final    Method: MIC    AMPICILLIN/SULBACTAM Resistant >=32 RESISTANT Final    Method: MIC    CEFAZOLIN Resistant >=64 RESISTANT Final    Method: MIC    CEFEPIME Sensitive <=1 SENSITIVE Final    Method: MIC     CEFTAZIDIME Sensitive 4 SENSITIVE Final    Method: MIC    CEFTRIAXONE Sensitive 4 SENSITIVE Final    Method: MIC    CIPROFLOXACIN Sensitive <=0.25 SENSITIVE Final    Method: MIC    GENTAMICIN Sensitive <=1 SENSITIVE Final    Method: MIC    IMIPENEM Sensitive 2 SENSITIVE Final    Method: MIC    PIP/TAZO Sensitive <=4 SENSITIVE Final    Method: MIC    TOBRAMYCIN Sensitive <=1 SENSITIVE Final    Method: MIC    TRIMETH/SULFA Sensitive <=20 SENSITIVE Final    Method: MIC    Comments PROTEUS MIRABILIS (MIC)    MODERATE PROTEUS MIRABILIS               Specimen Collected: 06/08/14 11:55 AM Last Resulted: 06/11/14 7:53 AM                   Results for DACE, DENN (MRN [id number]) as of 06/19/2014 09:36  Ref. Range 06/19/2014 08:12 06/19/2014 08:13  Sodium Latest Ref Range: 135-145 mmol/L  131 (L)  Potassium Latest Ref Range: 3.5-5.1 mmol/L  4.1  Chloride Latest Ref Range: 101-111 mmol/L  94 (L)  CO2 Latest Ref Range: 22-32 mmol/L  25  BUN Latest Ref Range: 6-20 mg/dL  31 (  H)  Creatinine Latest Ref Range: 0.61-1.24 mg/dL  5.42 (H)  Calcium Latest Ref Range: 8.9-10.3 mg/dL  9.6  EGFR (Non-African Amer.) Latest Ref Range: >60 mL/min  11 (L)  EGFR (African American) Latest Ref Range: >60 mL/min  12 (L)  Glucose Latest Ref Range: 65-99 mg/dL  92  Anion gap Latest Ref Range: 5-15   12  Phosphorus Latest Ref Range: 2.5-4.6 mg/dL  3.3  Albumin Latest Ref Range: 3.5-5.0 g/dL  2.2 (L)  WBC Latest Ref Range: 4.0-10.5 K/uL 13.2 (H)   RBC Latest Ref Range: 4.22-5.81 MIL/uL 3.02 (L)   Hemoglobin Latest Ref Range: 13.0-17.0 g/dL 9.1 (L)   HCT Latest Ref Range: 39.0-52.0 % 28.8 (L)   MCV Latest Ref Range: 78.0-100.0 fL 95.4   MCH Latest Ref Range: 26.0-34.0 pg 30.1   MCHC Latest Ref Range: 30.0-36.0 g/dL 31.6   RDW Latest Ref Range: 11.5-15.5 % 19.2 (H)   Platelets Latest Ref Range: 150-400 K/uL 172     Treatments: IV hydration, antibiotics:  vancomycin and Ceftazidime , analgesia: acetaminophen, Morphine and OxyIR, cardiac meds: carvedilol and amiodarone, anticoagulation: heparin and aspirin at discharge, therapies: PT, OT, RN and SW, dialysis: Hemodialysis and surgery: As above   Disposition: Skilled nursing facility      Discharge Instructions    Call MD / Call 911    Complete by:  As directed   If you experience chest pain or shortness of breath, CALL 911 and be transported to the hospital emergency room.  If you develope a fever above 101 F, pus (white drainage) or increased drainage or redness at the wound, or calf pain, call your surgeon's office.     Constipation Prevention    Complete by:  As directed   Drink plenty of fluids.  Prune juice may be helpful.  You may use a stool softener, such as Colace (over the counter) 100 mg twice a day.  Use MiraLax (over the counter) for constipation as needed.     Diet renal [id number]    Complete by:  As directed      Discharge instructions    Complete by:  As directed   Orthopaedic Trauma Service Discharge Instructions   General Discharge Instructions  WEIGHT BEARING STATUS: Nonweightbearing Left Leg  RANGE OF MOTION/ACTIVITY: Range of motion as tolerated Left Leg   PAIN MEDICATION USE AND EXPECTATIONS  You have likely been given narcotic medications to help control your pain.  After a traumatic event that results in an fracture (broken bone) with or without surgery, it is ok to use narcotic pain medications to help control one's pain.  We understand that everyone responds to pain differently and each individual patient will be evaluated on a regular basis for the continued need for narcotic medications. Ideally, narcotic medication use should last no more than 6-8 weeks (coinciding with fracture healing).   As a patient it is your responsibility as well to monitor narcotic medication use and report the amount and frequency you use these medications when you come to your  office visit.   We would also advise that if you are using narcotic medications, you should take a dose prior to therapy to maximize you participation.  IF YOU ARE ON NARCOTIC MEDICATIONS IT IS NOT PERMISSIBLE TO OPERATE A MOTOR VEHICLE (MOTORCYCLE/CAR/TRUCK/MOPED) OR HEAVY MACHINERY DO NOT MIX NARCOTICS WITH OTHER CNS (CENTRAL NERVOUS SYSTEM) DEPRESSANTS SUCH AS ALCOHOL  Diet: as you were eating previously.  Can use over the counter stool softeners and bowel  preparations, such as Miralax, to help with bowel movements.  Narcotics can be constipating.  Be sure to drink plenty of fluids  Wound Care: dressing changes every other day as needed. Dry dressing only. See detailed instructions below   STOP SMOKING OR USING NICOTINE PRODUCTS!!!!  As discussed nicotine severely impairs your body's ability to heal surgical and traumatic wounds but also impairs bone healing.  Wounds and bone heal by forming microscopic blood vessels (angiogenesis) and nicotine is a vasoconstrictor (essentially, shrinks blood vessels).  Therefore, if vasoconstriction occurs to these microscopic blood vessels they essentially disappear and are unable to deliver necessary nutrients to the healing tissue.  This is one modifiable factor that you can do to dramatically increase your chances of healing your injury.    (This means no smoking, no nicotine gum, patches, etc)  DO NOT USE NONSTEROIDAL ANTI-INFLAMMATORY DRUGS (NSAID'S)  Using products such as Advil (ibuprofen), Aleve (naproxen), Motrin (ibuprofen) for additional pain control during fracture healing can delay and/or prevent the healing response.  If you would like to take over the counter (OTC) medication, Tylenol (acetaminophen) is ok.  However, some narcotic medications that are given for pain control contain acetaminophen as well. Therefore, you should not exceed more than 4000 mg of tylenol in a day if you do not have liver disease.  Also note that there are may OTC  medicines, such as cold medicines and allergy medicines that my contain tylenol as well.  If you have any questions about medications and/or interactions please ask your doctor/PA or your pharmacist.      ICE AND ELEVATE INJURED/OPERATIVE EXTREMITY  Using ice and elevating the injured extremity above your heart can help with swelling and pain control.  Icing in a pulsatile fashion, such as 20 minutes on and 20 minutes off, can be followed.    Do not place ice directly on skin. Make sure there is a barrier between to skin and the ice pack.    Using frozen items such as frozen peas works well as the conform nicely to the are that needs to be iced.  USE AN ACE WRAP OR TED HOSE FOR SWELLING CONTROL  In addition to icing and elevation, Ace wraps or TED hose are used to help limit and resolve swelling.  It is recommended to use Ace wraps or TED hose until you are informed to stop.    When using Ace Wraps start the wrapping distally (farthest away from the body) and wrap proximally (closer to the body)   Example: If you had surgery on your leg or thing and you do not have a splint on, start the ace wrap at the toes and work your way up to the thigh        If you had surgery on your upper extremity and do not have a splint on, start the ace wrap at your fingers and work your way up to the upper arm  IF YOU ARE IN A SPLINT OR CAST DO NOT [place]   If your splint gets wet for any reason please contact the office immediately. You may shower in your splint or cast as long as you keep it dry.  This can be done by wrapping in a cast cover or garbage back (or similar)  Do Not stick any thing down your splint or cast such as pencils, money, or hangers to try and scratch yourself with.  If you feel itchy take benadryl as prescribed on the  bottle for itching  IF YOU ARE IN A CAM BOOT (BLACK BOOT)  You may remove boot periodically. Perform daily dressing changes as noted below.  Wash the liner of  the boot regularly and wear a sock when wearing the boot. It is recommended that you sleep in the boot until told otherwise  CALL THE OFFICE WITH ANY QUESTIONS OR CONCERTS: [phone]     Discharge Pin Site Instructions  Dress pins daily with Kerlix roll starting on POD 2. Wrap the Kerlix so that it tamps the skin down around the pin-skin interface to prevent/limit motion of the skin relative to the pin.  (Pin-skin motion is the primary cause of pain and infection related to external fixator pin sites).  Remove any crust or coagulum that may obstruct drainage with a saline moistened gauze or soap and water.  After POD 3, if there is no discernable drainage on the pin site dressing, the interval for change can by increased to every other day.  You may shower with the fixator, cleaning all pin sites gently with soap and water.  If you have a surgical wound this needs to be completely dry and without drainage before showering.  The extremity can be lifted by the fixator to facilitate wound care and transfers.  Notify the office/Doctor if you experience increasing drainage, redness, or pain from a pin site, or if you notice purulent (thick, snot-like) drainage.  Discharge Wound Care Instructions  Do NOT apply any ointments, solutions or lotions to pin sites or surgical wounds.  These prevent needed drainage and even though solutions like hydrogen peroxide kill bacteria, they also damage cells lining the pin sites that help fight infection.  Applying lotions or ointments can keep the wounds moist and can cause them to breakdown and open up as well. This can increase the risk for infection. When in doubt call the office.  Surgical incisions should be dressed daily.  If any drainage is noted, use one layer of adaptic, then gauze, Kerlix, and an ace wrap.  Once the incision is completely dry and without drainage, it may be left open to air out.  Showering may begin 36-48 hours later.  Cleaning  gently with soap and water.  Traumatic wounds should be dressed daily as well.    One layer of adaptic, gauze, Kerlix, then ace wrap.  The adaptic can be discontinued once the draining has ceased    If you have a wet to dry dressing: wet the gauze with saline the squeeze as much saline out so the gauze is moist (not soaking wet), place moistened gauze over wound, then place a dry gauze over the moist one, followed by Kerlix wrap, then ace wrap.     Increase activity slowly as tolerated    Complete by:  As directed      Non weight bearing    Complete by:  As directed   Laterality:  left  Extremity:  Lower            Medication List    STOP taking these medications        amLODipine 2.5 MG tablet  Commonly known as:  NORVASC     amoxicillin-clavulanate 500-125 MG per tablet  Commonly known as:  AUGMENTIN     hydrALAZINE 100 MG tablet  Commonly known as:  APRESOLINE     metolazone 2.5 MG tablet  Commonly known as:  ZAROXOLYN      TAKE these medications  acetaminophen 325 MG tablet  Commonly known as:  TYLENOL  Take 2 tablets (650 mg total) by mouth every 6 (six) hours as needed for moderate pain or fever.     allopurinol 100 MG tablet  Commonly known as:  ZYLOPRIM  Take 1 tablet (100 mg total) by mouth 2 (two) times daily.     amiodarone 100 MG tablet  Commonly known as:  PACERONE  Take 1 tablet (100 mg total) by mouth daily.     aspirin 325 MG tablet  Take 325 mg by mouth daily.     calcitRIOL 0.5 MCG capsule  Commonly known as:  ROCALTROL  Take 0.5 mcg by mouth daily.     calcium carbonate (dosed in mg elemental calcium) 1250 (500 CA) MG/5ML  Take 15 mLs (1,500 mg of elemental calcium total) by mouth every 6 (six) hours as needed for indigestion.     carvedilol 3.125 MG tablet  Commonly known as:  COREG  Take 1 tablet (3.125 mg total) by mouth 2 (two) times daily with a meal.     cefTAZidime 2 g in dextrose 5 % 50 mL  Inject 2 g into the vein every  Monday, Wednesday, and Friday with hemodialysis.     docusate sodium 100 MG capsule  Commonly known as:  COLACE  Take 1 capsule (100 mg total) by mouth 2 (two) times daily.     doxercalciferol 4 MCG/2ML injection  Commonly known as:  HECTOROL  Inject 0.5 mLs (1 mcg total) into the vein every Monday, Wednesday, and Friday with hemodialysis.     isosorbide mononitrate 60 MG 24 hr tablet  Commonly known as:  IMDUR  TAKE 1 TABLETS BY MOUTH EVERY DAY     multivitamin Tabs tablet  Take 1 tablet by mouth at bedtime.     oxyCODONE 5 MG immediate release tablet  Commonly known as:  Oxy IR/ROXICODONE  Take 1-2 tablets (5-10 mg total) by mouth every 3 (three) hours as needed for breakthrough pain.     pravastatin 40 MG tablet  Commonly known as:  PRAVACHOL  TAKE 1 TABLET (40 MG TOTAL) BY MOUTH DAILY.     predniSONE 5 MG tablet  Commonly known as:  DELTASONE  Take 1 tablet (5 mg total) by mouth daily with breakfast.     PROBIOTIC DAILY PO  Take 1 capsule by mouth daily.     saccharomyces boulardii 250 MG capsule  Commonly known as:  FLORASTOR  Take 1 capsule (250 mg total) by mouth 2 (two) times daily.     traMADol 50 MG tablet  Commonly known as:  ULTRAM  Take 1 tablet (50 mg total) by mouth every 6 (six) hours as needed (pain). As needed for pain     vancomycin 1 GM/200ML Soln  Commonly known as:  VANCOCIN  Inject 200 mLs (1,000 mg total) into the vein every Monday, Wednesday, and Friday with hemodialysis.     Vitamin D-3 5000 UNITS Tabs  Take 5,000 Units by mouth daily.       Follow-up Information    Follow up with HANDY,MICHAEL H, MD. Schedule an appointment as soon as possible for a visit in 2 weeks.   Specialty:  Orthopedic Surgery   Why:  For suture removal, For wound re-check   Contact information:   [place] [address] [phone]       Follow up with Bobby Rumpf, MD In 4 weeks.   Specialty:  Infectious Diseases  Why:   re-assessment of abx    Contact information:   [place] [address] [phone]       Schedule an appointment as soon as possible for a visit with Ulla Potash., MD.   Specialty:  Nephrology   Contact information:   [address] [place] [address] [phone]       Discharge Instructions and Plan:  59 y/o male s/p I&D for persistent L distal femur osteo  1. L distal femur osteomyelitis and nonunion             New resorbable abx beads placed             No hardware implanted             Nonunion present             NWB L leg              range of motion as tolerated left knee  Chronic venous insufficiency ulcers  Weekly Unna boot changes bilaterally  Next scheduled changes 06/26/2014  2. hypotension             Likely multifactorial             resolved               Continue per IM             Only on coreg for HTN at this time, amlodipine and hydralazine on hold   Can resume amlodipine and hydralazine as outpt when clinically indicated   3. ESRD on dialysis             Dialysis completed for today              Appreciate Renals assistance   Dialysis on MWF  Pt will get abx with HD   4. ID             culture positive for proteus mirabilis and abundand GPC (office cx shows MRSA)             vanc and ceftazidime with HD x 6 weeks              Appreciate ID assistance   Abx to be administered during HD   Appreciate renal service for arranging this   5. Failed renal transplant             prednisone 5 mg daily   Dialysis MWF   S/p AV fistula creation on 06/01/2014  6. Acute on chronic anemia             stable   7. Paroxysmal atrial flutter   Not on chronic anticoagulation secondary to GI bleed     Continue aspirin     Continue carvedilol     Continue amiodarone     FOBT negative this admission.   Currently in sinus rhythm     8. Dispo             Dc to snf today   Follow-up with orthopedics in 2 weeks  Follow-up with  infectious disease 4 weeks  Follow-up with PCP in 2 weeks   Signed:  Jari Pigg, PA-C [facility] [phone] (P) 06/19/2014, 9:32 AM

## 2014-06-19 NOTE — Progress Notes (Signed)
Patient dc'd to SNF and report called to nurse at [facility].

## 2014-06-19 NOTE — Clinical Social Work Placement (Signed)
   CLINICAL SOCIAL WORK PLACEMENT  NOTE  Date:  06/19/2014  Patient Details  Name: Chad [name] MRN: [id number] Date of Birth: 12/13/1955  Clinical Social Work is seeking post-discharge placement for this patient at the Skilled  Nursing Facility level of care (*CSW will initial, date and re-position this form in  chart as items are completed):  Yes   Patient/family provided with  Clinical Social Work Department's list of facilities offering this level of care within the geographic area requested by the patient (or if unable, by the patient's family).  Yes   Patient/family informed of their freedom to choose among providers that offer the needed level of care, that participate in Medicare, Medicaid or managed care program needed by the patient, have an available bed and are willing to accept the patient.  Yes   Patient/family informed of 's ownership interest in [facility] and [facility], as well as of the fact that they are under no obligation to receive care at these facilities.  PASRR submitted to EDS on       PASRR number received on       Existing PASRR number confirmed on 06/15/14     FL2 transmitted to all facilities in geographic area requested by pt/family on 06/15/14     FL2 transmitted to all facilities within larger geographic area on       Patient informed that his/her managed care company has contracts with or will negotiate with certain facilities, including the following:        Yes   Patient/family informed of bed offers received.  Patient chooses bed at [facility]     Physician recommends and patient chooses bed at      Patient to be transferred to [facility] on 06/19/14.  Patient to be transferred to facility by [facility]     Patient family notified on 06/19/14 of transfer.  Name of family member notified:  Patient updated at bedside.     PHYSICIAN       Additional Comment:     _______________________________________________ Rod Mae, LCSW 06/19/2014, 2:38 PM [phone]

## 2014-06-19 NOTE — Progress Notes (Signed)
Physical Therapy Treatment Patient Details Name: Chad [name] MRN: [id number] DOB: 10/25/1955 Today's Date: 06/19/2014    History of Present Illness Pt is a 59 y/o male with a PMH of CKD s/p renal transplant, cardiomyopathy, CHF, Hep C. 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 Pt with previous IM nailing, and hardware was removed and replaced with abx beads in 02/2014 to try and address this. Pt is now s/p I&D of his L thigh/distal femur.     PT Comments    Pt requests assist to transfer to [facility] upon PT arrival. Pt required min assist for placement of sliding board for transfer to [facility].  Pt would like to wash up once done on [facility] and RN was notified.  Pt is anticipating d/c to CIR this afternoon.   Follow Up Recommendations  CIR;Supervision for mobility/OOB     Equipment Recommendations  None recommended by PT    Recommendations for Other Services Rehab consult     Precautions / Restrictions Precautions Precautions: Fall Precaution Comments: Unna boot on RLE - pt has wounds there as well Required Braces or Orthoses: Other Brace/Splint Other Brace/Splint: PRAFO LLE Restrictions Weight Bearing Restrictions: Yes LLE Weight Bearing: Non weight bearing    Mobility  Bed Mobility Overal bed mobility: Needs Assistance Bed Mobility: Supine to Sit     Supine to sit: Min assist     General bed mobility comments: Min assist w/ managing LLE to sitting EOB.  Mod use of bed rails, increased time  Transfers Overall transfer level: Needs assistance Equipment used:  (sliding board) Transfers: Lateral/Scoot Transfers          Lateral/Scoot Transfers: Min assist;With slide board General transfer comment: Min assist w/ sliding board placement while pt shift weight onto L hip in sitting.  Pt able to adhere to NWB status of LLE during sliding board trasnfer this session.  Ambulation/Gait                 Stairs            Wheelchair Mobility    Modified Rankin (Stroke Patients Only)       Balance Overall balance assessment: Needs assistance Sitting-balance support: Bilateral upper extremity supported;Feet supported (RLE supported) Sitting balance-Leahy Scale: Fair                              Cognition Arousal/Alertness: Awake/alert Behavior During Therapy: WFL for tasks assessed/performed Overall Cognitive Status: Within Functional Limits for tasks assessed                      Exercises      General Comments        Pertinent Vitals/Pain Pain Assessment: 0-10 Pain Score: 2  Pain Location: LE's w/ movement Pain Descriptors / Indicators: Aching Pain Intervention(s): Limited activity within patient's tolerance;Monitored during session;Repositioned    Home Living                      Prior Function            PT Goals (current goals can now be found in the care plan section) Acute Rehab PT Goals Patient Stated Goal: to go to rehab today Progress towards PT goals: Progressing toward goals    Frequency  Min 3X/week    PT Plan Current plan remains appropriate    Co-evaluation  End of Session Equipment Utilized During Treatment: Other (comment) (L PRAFO boot) Activity Tolerance: Patient tolerated treatment well Patient left: with call bell/phone within reach (on [facility])     Time: 0037-0488 PT Time Calculation (min) (ACUTE ONLY): 13 min  Charges:  $Therapeutic Activity: 8-22 mins                    G Codes:      Michail Jewels PT, [place] [phone] Pager: [phone] 06/19/2014, 1:27 PM

## 2014-06-19 NOTE — Progress Notes (Signed)
Pt. Not able to be seen for skilled OT at this time secondary to having HD.  Will check back as able. Thanks,  Tommy Medal, COTA/L

## 2014-06-19 NOTE — Progress Notes (Signed)
CONSULT PROGRESS NOTE  Chad [name] ZOX:[id number] DOB: 1955/06/15 DOA: 06/08/2014 PCP: [name], MD   24 hours: - no new issues, awaiting placement, OK to d/c today from Medicine standpoint. He is feeling good.  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 Active Problems:   Thrombocytopenia   Acute respiratory failure   Renal transplant disorder   Cardiomyopathy   Atrial flutter   Ulcer of left lower leg   Chronic systolic CHF (congestive heart failure)   CKD (chronic kidney disease)   Hypertension   History of renal transplant   Osteomyelitis   Hepatitis C   Hyperparathyroidism due to renal insufficiency   Shock   Arterial hypotension   ESRD (end stage renal disease)   ESRD on dialysis   Acute blood loss anemia  Abdominal pain  - resolved, no further loose BMs  Hypotension - stable, would be OK to discharge without amlodipine and hydralazine for now, these can be restarted as an outpatient  Osteomyelitis left femur - Status post I&D 06/08/2014, Dr. Carola Frost - Wound care per orthopedics - Antibiotics per infectious disease, on Vancomycin and Elita Quick which are given with HD - wound culture +proteus, Blood cultures remain negative, wbc improving - placement to SNF today   Chronic systolic CHF/nonischemic cardiomyopathy - this is stable, no chest pain, no fluid overload, continue Coreg and Imdur on d/c  Thrombocytopenia - This has been chronic dating back to November 2013 - likely due to chronic hep C with 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 - stable since admission, now normal - platelets have normalized  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 - AV fistula creation 06/01/2014  - Dialysis Monday, Wednesday, Friday - Continue chronic home dose of prednisone 5 mg daily - Dialysis per nephrology  Paroxysmal atrial flutter  - Not on anticoagulation secondary to GI bleed  - Continue aspirin  -  Continue carvedilol  - Continue amiodarone  - FOBT negative this admission. - Currently in sinus rhythm   Anemia of CKD - Patient's baseline hemoglobin has had wide variations but baseline seems to be around 11-12 - Drop in hemoglobin likely dilutional and surgery, now stable - Aranesp per nephrology  Hyperlipidemia - Continue pravastatin   Code Status: full Family Communication: patient  Disposition Plan: per primary team  Procedures:  HD  I/d left thigh 5/12  Antibiotics:  Rocephin  Vancomycin  Objective: BP 100/65 mmHg  Pulse 84  Temp(Src) 98 F (36.7 C) (Oral)  Resp 20  Ht 6' (1.829 m)  Wt 107 kg (235 lb 14.3 oz)  BMI 31.99 kg/m2  SpO2 95%  Intake/Output Summary (Last 24 hours) at 06/19/14 0843 Last data filed at 06/18/14 1804  Gross per 24 hour  Intake    720 ml  Output      0 ml  Net    720 ml   Filed Weights   06/16/14 0659 06/16/14 1042 06/19/14 0800  Weight: 106.8 kg (235 lb 7.2 oz) 104.1 kg (229 lb 8 oz) 107 kg (235 lb 14.3 oz)   Exam:  General:  NAD  CV: RRR, 2/6 SEM, good pulses, no edema  Pulm: CTA biL  Abd: soft, non tender  Neuro: non focal   Data Reviewed: Basic Metabolic Panel:  Recent Labs Lab 06/16/14 0724 06/17/14 0530  NA 132* 130*  K 3.8 4.8  CL 97* 94*  CO2 26 27  GLUCOSE 82 91  BUN 14 7  CREATININE 3.74* 3.14*  CALCIUM 8.9 8.9  PHOS 3.6  --    Liver Function Tests:  Recent Labs Lab 06/16/14 0724  ALBUMIN 2.1*   CBC:  Recent Labs Lab 06/16/14 0723 06/17/14 0530 06/19/14 0812  WBC 13.2* 15.3* 13.2*  HGB 8.6* 9.2* 9.1*  HCT 28.1* 29.1* 28.8*  MCV 95.3 97.3 95.4  PLT 184 164 172   CBG: No results for input(s): GLUCAP in the last 168 hours.  No results found for this or any previous visit (from the past 240 hour(s)).   Studies: No results found.  Scheduled Meds: . allopurinol  100 mg Oral BID  . amiodarone  100 mg Oral Daily  . aspirin EC  325 mg Oral Daily  . calcium acetate  667 mg  Oral TID WC  . carvedilol  3.125 mg Oral BID WC  . cefTAZidime (FORTAZ)  IV  2 g Intravenous Q M,W,F-HD  . darbepoetin (ARANESP) injection - DIALYSIS  100 mcg Intravenous Q Mon-HD  . docusate sodium  100 mg Oral BID  . doxercalciferol  1 mcg Intravenous Q M,W,F-HD  . feeding supplement (PRO-STAT SUGAR FREE 64)  30 mL Oral BID  . heparin subcutaneous  5,000 Units Subcutaneous 3 times per day  . multivitamin  1 tablet Oral QHS  . pravastatin  40 mg Oral q1800  . predniSONE  5 mg Oral Q breakfast  . saccharomyces boulardii  250 mg Oral BID  . vancomycin  1,000 mg Intravenous Q M,W,F-HD    Continuous Infusions: . sodium chloride 10 mL/hr at 06/08/14 1035   Time spent: 15 min  Pamella Pert MD  [name] Pager [phone]. If 7PM-7AM, please contact night-coverage at [email or web address], password [facility] 06/19/2014, 8:43 AM  LOS: 11 days

## 2014-06-19 NOTE — Discharge Planning (Signed)
Patient to be discharged to [facility] and Rehab. Patient updated regarding discharge.  *Patient has wheelchair at bedside. [facility] admissions liaison to pick-up patient's wheelchair from unit and transport to facility*  Facility: [facility] RN report number: [phone] Transportation: EMS ([address])  Marcelline Deist, [place] - [phone] Clinical Social Work Department Orthopedics [phone]) and Surgical [phone])

## 2014-06-19 NOTE — Discharge Instructions (Signed)
Orthopaedic Trauma Service Discharge Instructions   General Discharge Instructions  WEIGHT BEARING STATUS: Nonweightbearing Left Leg  RANGE OF MOTION/ACTIVITY: Range of motion as tolerated Left Leg   PAIN MEDICATION USE AND EXPECTATIONS  You have likely been given narcotic medications to help control your pain.  After a traumatic event that results in an fracture (broken bone) with or without surgery, it is ok to use narcotic pain medications to help control one's pain.  We understand that everyone responds to pain differently and each individual patient will be evaluated on a regular basis for the continued need for narcotic medications. Ideally, narcotic medication use should last no more than 6-8 weeks (coinciding with fracture healing).   As a patient it is your responsibility as well to monitor narcotic medication use and report the amount and frequency you use these medications when you come to your office visit.   We would also advise that if you are using narcotic medications, you should take a dose prior to therapy to maximize you participation.  IF YOU ARE ON NARCOTIC MEDICATIONS IT IS NOT PERMISSIBLE TO OPERATE A MOTOR VEHICLE (MOTORCYCLE/CAR/TRUCK/MOPED) OR HEAVY MACHINERY DO NOT MIX NARCOTICS WITH OTHER CNS (CENTRAL NERVOUS SYSTEM) DEPRESSANTS SUCH AS ALCOHOL  Diet: as you were eating previously.  Can use over the counter stool softeners and bowel preparations, such as Miralax, to help with bowel movements.  Narcotics can be constipating.  Be sure to drink plenty of fluids  Wound Care: dressing changes every other day as needed. Dry dressing only. See detailed instructions below   STOP SMOKING OR USING NICOTINE PRODUCTS!!!!  As discussed nicotine severely impairs your body's ability to heal surgical and traumatic wounds but also impairs bone healing.  Wounds and bone heal by forming microscopic blood vessels (angiogenesis) and nicotine is a vasoconstrictor (essentially, shrinks  blood vessels).  Therefore, if vasoconstriction occurs to these microscopic blood vessels they essentially disappear and are unable to deliver necessary nutrients to the healing tissue.  This is one modifiable factor that you can do to dramatically increase your chances of healing your injury.    (This means no smoking, no nicotine gum, patches, etc)  DO NOT USE NONSTEROIDAL ANTI-INFLAMMATORY DRUGS (NSAID'S)  Using products such as Advil (ibuprofen), Aleve (naproxen), Motrin (ibuprofen) for additional pain control during fracture healing can delay and/or prevent the healing response.  If you would like to take over the counter (OTC) medication, Tylenol (acetaminophen) is ok.  However, some narcotic medications that are given for pain control contain acetaminophen as well. Therefore, you should not exceed more than 4000 mg of tylenol in a day if you do not have liver disease.  Also note that there are may OTC medicines, such as cold medicines and allergy medicines that my contain tylenol as well.  If you have any questions about medications and/or interactions please ask your doctor/PA or your pharmacist.      ICE AND ELEVATE INJURED/OPERATIVE EXTREMITY  Using ice and elevating the injured extremity above your heart can help with swelling and pain control.  Icing in a pulsatile fashion, such as 20 minutes on and 20 minutes off, can be followed.    Do not place ice directly on skin. Make sure there is a barrier between to skin and the ice pack.    Using frozen items such as frozen peas works well as the conform nicely to the are that needs to be iced.  USE AN ACE WRAP OR TED HOSE FOR SWELLING CONTROL  In addition to icing and elevation, Ace wraps or TED hose are used to help limit and resolve swelling.  It is recommended to use Ace wraps or TED hose until you are informed to stop.    When using Ace Wraps start the wrapping distally (farthest away from the body) and wrap proximally (closer to the  body)   Example: If you had surgery on your leg or thing and you do not have a splint on, start the ace wrap at the toes and work your way up to the thigh        If you had surgery on your upper extremity and do not have a splint on, start the ace wrap at your fingers and work your way up to the upper arm  IF YOU ARE IN A SPLINT OR CAST DO NOT REMOVE IT FOR ANY REASON   If your splint gets wet for any reason please contact the office immediately. You may shower in your splint or cast as long as you keep it dry.  This can be done by wrapping in a cast cover or garbage back (or similar)  Do Not stick any thing down your splint or cast such as pencils, money, or hangers to try and scratch yourself with.  If you feel itchy take benadryl as prescribed on the bottle for itching  IF YOU ARE IN A CAM BOOT (BLACK BOOT)  You may remove boot periodically. Perform daily dressing changes as noted below.  Wash the liner of the boot regularly and wear a sock when wearing the boot. It is recommended that you sleep in the boot until told otherwise  CALL THE OFFICE WITH ANY QUESTIONS OR CONCERTS: [phone]     Discharge Pin Site Instructions  Dress pins daily with Kerlix roll starting on POD 2. Wrap the Kerlix so that it tamps the skin down around the pin-skin interface to prevent/limit motion of the skin relative to the pin.  (Pin-skin motion is the primary cause of pain and infection related to external fixator pin sites).  Remove any crust or coagulum that may obstruct drainage with a saline moistened gauze or soap and water.  After POD 3, if there is no discernable drainage on the pin site dressing, the interval for change can by increased to every other day.  You may shower with the fixator, cleaning all pin sites gently with soap and water.  If you have a surgical wound this needs to be completely dry and without drainage before showering.  The extremity can be lifted by the fixator to facilitate  wound care and transfers.  Notify the office/Doctor if you experience increasing drainage, redness, or pain from a pin site, or if you notice purulent (thick, snot-like) drainage.  Discharge Wound Care Instructions  Do NOT apply any ointments, solutions or lotions to pin sites or surgical wounds.  These prevent needed drainage and even though solutions like hydrogen peroxide kill bacteria, they also damage cells lining the pin sites that help fight infection.  Applying lotions or ointments can keep the wounds moist and can cause them to breakdown and open up as well. This can increase the risk for infection. When in doubt call the office.  Surgical incisions should be dressed daily.  If any drainage is noted, use one layer of adaptic, then gauze, Kerlix, and an ace wrap.  Once the incision is completely dry and without drainage, it may be left open to air out.  Showering may begin 36-48  hours later.  Cleaning gently with soap and water.  Traumatic wounds should be dressed daily as well.    One layer of adaptic, gauze, Kerlix, then ace wrap.  The adaptic can be discontinued once the draining has ceased    If you have a wet to dry dressing: wet the gauze with saline the squeeze as much saline out so the gauze is moist (not soaking wet), place moistened gauze over wound, then place a dry gauze over the moist one, followed by Kerlix wrap, then ace wrap.

## 2014-06-19 NOTE — Progress Notes (Signed)
Per MD order, central line removed. IV cathter intact. Vaseline pressure gauze to site, pressure held x 5 min, no bleeding to site. Pt instructed not to get out of bed for 30 min after the removal of the central line. Instucted to keep dressing CDI x 24hours, if bleeding occurs hold pressure, if bleeding does not stop contact MD or go to the ED. Pt verbalized understanding and did not have any questions. Chad [name]  

## 2014-06-19 NOTE — Procedures (Signed)
I was present at this dialysis session. I have reviewed the session itself and made appropriate changes.   Doing well.  TDC. For DC possibly today. Will arrange [facility]X [facility].    Sabra Heck  MD 06/19/2014, 8:32 AM

## 2014-06-21 LAB — VITAMIN C
Vitamin C: 0.2 mg/dL (ref 0.2–1.5)
Vitamin C: 0.2 mg/dL (ref 0.2–1.5)

## 2014-06-22 ENCOUNTER — Encounter (HOSPITAL_COMMUNITY): Payer: Self-pay | Admitting: *Deleted

## 2014-06-22 ENCOUNTER — Emergency Department (HOSPITAL_COMMUNITY): Payer: Medicare HMO

## 2014-06-22 ENCOUNTER — Inpatient Hospital Stay (HOSPITAL_COMMUNITY)
Admission: EM | Admit: 2014-06-22 | Discharge: 2014-06-28 | DRG: 570 | Disposition: A | Discharge status: Skilled Nursing Facility | Payer: Medicare HMO | Attending: Internal Medicine | Admitting: Internal Medicine

## 2014-06-22 DIAGNOSIS — Z992 Dependence on renal dialysis: Secondary | ICD-10-CM | POA: Diagnosis not present

## 2014-06-22 DIAGNOSIS — D696 Thrombocytopenia, unspecified: Secondary | ICD-10-CM | POA: Diagnosis not present

## 2014-06-22 DIAGNOSIS — S72412K Displaced unspecified condyle fracture of lower end of left femur, subsequent encounter for closed fracture with nonunion: Secondary | ICD-10-CM

## 2014-06-22 DIAGNOSIS — L97429 Non-pressure chronic ulcer of left heel and midfoot with unspecified severity: Secondary | ICD-10-CM | POA: Diagnosis present

## 2014-06-22 DIAGNOSIS — I950 Idiopathic hypotension: Secondary | ICD-10-CM | POA: Diagnosis not present

## 2014-06-22 DIAGNOSIS — Z7982 Long term (current) use of aspirin: Secondary | ICD-10-CM | POA: Diagnosis not present

## 2014-06-22 DIAGNOSIS — I5022 Chronic systolic (congestive) heart failure: Secondary | ICD-10-CM | POA: Diagnosis present

## 2014-06-22 DIAGNOSIS — M86652 Other chronic osteomyelitis, left thigh: Secondary | ICD-10-CM | POA: Diagnosis present

## 2014-06-22 DIAGNOSIS — I120 Hypertensive chronic kidney disease with stage 5 chronic kidney disease or end stage renal disease: Secondary | ICD-10-CM | POA: Diagnosis not present

## 2014-06-22 DIAGNOSIS — I959 Hypotension, unspecified: Secondary | ICD-10-CM | POA: Diagnosis present

## 2014-06-22 DIAGNOSIS — S81812D Laceration without foreign body, left lower leg, subsequent encounter: Secondary | ICD-10-CM | POA: Diagnosis not present

## 2014-06-22 DIAGNOSIS — B182 Chronic viral hepatitis C: Secondary | ICD-10-CM | POA: Diagnosis present

## 2014-06-22 DIAGNOSIS — I480 Paroxysmal atrial fibrillation: Secondary | ICD-10-CM | POA: Diagnosis not present

## 2014-06-22 DIAGNOSIS — M86352 Chronic multifocal osteomyelitis, left femur: Secondary | ICD-10-CM | POA: Diagnosis not present

## 2014-06-22 DIAGNOSIS — Z792 Long term (current) use of antibiotics: Secondary | ICD-10-CM | POA: Diagnosis not present

## 2014-06-22 DIAGNOSIS — N2581 Secondary hyperparathyroidism of renal origin: Secondary | ICD-10-CM | POA: Diagnosis not present

## 2014-06-22 DIAGNOSIS — E785 Hyperlipidemia, unspecified: Secondary | ICD-10-CM | POA: Diagnosis present

## 2014-06-22 DIAGNOSIS — D62 Acute posthemorrhagic anemia: Secondary | ICD-10-CM | POA: Diagnosis not present

## 2014-06-22 DIAGNOSIS — S81812A Laceration without foreign body, left lower leg, initial encounter: Secondary | ICD-10-CM | POA: Diagnosis not present

## 2014-06-22 DIAGNOSIS — S72422K Displaced fracture of lateral condyle of left femur, subsequent encounter for closed fracture with nonunion: Secondary | ICD-10-CM | POA: Diagnosis present

## 2014-06-22 DIAGNOSIS — Z940 Kidney transplant status: Secondary | ICD-10-CM

## 2014-06-22 DIAGNOSIS — Z7952 Long term (current) use of systemic steroids: Secondary | ICD-10-CM

## 2014-06-22 DIAGNOSIS — Z452 Encounter for adjustment and management of vascular access device: Secondary | ICD-10-CM

## 2014-06-22 DIAGNOSIS — I429 Cardiomyopathy, unspecified: Secondary | ICD-10-CM | POA: Diagnosis present

## 2014-06-22 DIAGNOSIS — I9589 Other hypotension: Secondary | ICD-10-CM | POA: Diagnosis not present

## 2014-06-22 DIAGNOSIS — S72432K Displaced fracture of medial condyle of left femur, subsequent encounter for closed fracture with nonunion: Secondary | ICD-10-CM

## 2014-06-22 DIAGNOSIS — I4892 Unspecified atrial flutter: Secondary | ICD-10-CM | POA: Diagnosis not present

## 2014-06-22 DIAGNOSIS — N186 End stage renal disease: Secondary | ICD-10-CM

## 2014-06-22 DIAGNOSIS — T8612 Kidney transplant failure: Secondary | ICD-10-CM | POA: Diagnosis not present

## 2014-06-22 DIAGNOSIS — Z79891 Long term (current) use of opiate analgesic: Secondary | ICD-10-CM

## 2014-06-22 DIAGNOSIS — Y830 Surgical operation with transplant of whole organ as the cause of abnormal reaction of the patient, or of later complication, without mention of misadventure at the time of the procedure: Secondary | ICD-10-CM | POA: Diagnosis present

## 2014-06-22 DIAGNOSIS — K219 Gastro-esophageal reflux disease without esophagitis: Secondary | ICD-10-CM | POA: Diagnosis present

## 2014-06-22 DIAGNOSIS — Z79899 Other long term (current) drug therapy: Secondary | ICD-10-CM | POA: Diagnosis not present

## 2014-06-22 DIAGNOSIS — I872 Venous insufficiency (chronic) (peripheral): Secondary | ICD-10-CM | POA: Diagnosis present

## 2014-06-22 DIAGNOSIS — M109 Gout, unspecified: Secondary | ICD-10-CM | POA: Diagnosis present

## 2014-06-22 DIAGNOSIS — W19XXXA Unspecified fall, initial encounter: Secondary | ICD-10-CM | POA: Diagnosis present

## 2014-06-22 DIAGNOSIS — D631 Anemia in chronic kidney disease: Secondary | ICD-10-CM | POA: Diagnosis not present

## 2014-06-22 DIAGNOSIS — M869 Osteomyelitis, unspecified: Secondary | ICD-10-CM | POA: Diagnosis present

## 2014-06-22 LAB — CBC WITH DIFFERENTIAL/PLATELET
BASOS ABS: 0.1 10*3/uL (ref 0.0–0.1)
Basophils Relative: 0 % (ref 0–1)
EOS ABS: 0.2 10*3/uL (ref 0.0–0.7)
Eosinophils Relative: 1 % (ref 0–5)
HEMATOCRIT: 31.7 % — AB (ref 39.0–52.0)
Hemoglobin: 9.9 g/dL — ABNORMAL LOW (ref 13.0–17.0)
LYMPHS ABS: 2.4 10*3/uL (ref 0.7–4.0)
Lymphocytes Relative: 13 % (ref 12–46)
MCH: 30.5 pg (ref 26.0–34.0)
MCHC: 31.2 g/dL (ref 30.0–36.0)
MCV: 97.5 fL (ref 78.0–100.0)
Monocytes Absolute: 2.1 10*3/uL — ABNORMAL HIGH (ref 0.1–1.0)
Monocytes Relative: 12 % (ref 3–12)
Neutro Abs: 13.5 10*3/uL — ABNORMAL HIGH (ref 1.7–7.7)
Neutrophils Relative %: 74 % (ref 43–77)
Platelets: 133 10*3/uL — ABNORMAL LOW (ref 150–400)
RBC: 3.25 MIL/uL — AB (ref 4.22–5.81)
RDW: 19.3 % — ABNORMAL HIGH (ref 11.5–15.5)
WBC: 18.3 10*3/uL — ABNORMAL HIGH (ref 4.0–10.5)

## 2014-06-22 LAB — BASIC METABOLIC PANEL
Anion gap: 12 (ref 5–15)
BUN: 14 mg/dL (ref 6–20)
CO2: 25 mmol/L (ref 22–32)
CREATININE: 3.85 mg/dL — AB (ref 0.61–1.24)
Calcium: 9.2 mg/dL (ref 8.9–10.3)
Chloride: 97 mmol/L — ABNORMAL LOW (ref 101–111)
GFR calc Af Amer: 19 mL/min — ABNORMAL LOW (ref >60)
GFR calc non Af Amer: 16 mL/min — ABNORMAL LOW (ref >60)
Glucose, Bld: 92 mg/dL (ref 65–99)
POTASSIUM: 3.9 mmol/L (ref 3.5–5.1)
Sodium: 134 mmol/L — ABNORMAL LOW (ref 135–145)

## 2014-06-22 LAB — LACTIC ACID, PLASMA: Lactic Acid, Venous: 2.2 mmol/L (ref 0.5–2.0)

## 2014-06-22 MED ORDER — OXYCODONE HCL 5 MG PO TABS
5.0000 mg | ORAL_TABLET | ORAL | Status: DC | PRN
  Administered 2014-06-23 – 2014-06-28 (×4): 10 mg via ORAL
  Filled 2014-06-22 (×4): qty 2

## 2014-06-22 MED ORDER — HEPARIN SODIUM (PORCINE) 5000 UNIT/ML IJ SOLN
5000.0000 [IU] | Freq: Three times a day (TID) | INTRAMUSCULAR | Status: DC
  Administered 2014-06-23 – 2014-06-28 (×13): 5000 [IU] via SUBCUTANEOUS
  Filled 2014-06-22 (×19): qty 1

## 2014-06-22 MED ORDER — PREDNISONE 5 MG PO TABS
5.0000 mg | ORAL_TABLET | Freq: Every day | ORAL | Status: DC
  Administered 2014-06-24 – 2014-06-28 (×4): 5 mg via ORAL
  Filled 2014-06-22 (×7): qty 1

## 2014-06-22 MED ORDER — DOXERCALCIFEROL 4 MCG/2ML IV SOLN
1.0000 ug | INTRAVENOUS | Status: DC
Start: 2014-06-23 — End: 2014-06-22

## 2014-06-22 MED ORDER — OXYCODONE HCL 5 MG PO TABS: 5.0000 mg | ORAL_TABLET | Freq: Once | ORAL | Status: DC

## 2014-06-22 MED ORDER — ACETAMINOPHEN 325 MG PO TABS
650.0000 mg | ORAL_TABLET | Freq: Four times a day (QID) | ORAL | Status: DC | PRN
Start: 2014-06-22 — End: 2014-06-28

## 2014-06-22 MED ORDER — DEXTROSE 5 % IV SOLN
2.0000 g | INTRAVENOUS | Status: DC
  Administered 2014-06-23: 2 g via INTRAVENOUS
  Filled 2014-06-22 (×4): qty 2

## 2014-06-22 MED ORDER — ONDANSETRON HCL 4 MG PO TABS: 4.0000 mg | ORAL_TABLET | Freq: Four times a day (QID) | ORAL | Status: DC | PRN

## 2014-06-22 MED ORDER — CALCITRIOL 0.5 MCG PO CAPS
0.5000 ug | ORAL_CAPSULE | Freq: Every day | ORAL | Status: DC
  Filled 2014-06-22: qty 1

## 2014-06-22 MED ORDER — ACETAMINOPHEN 650 MG RE SUPP: 650.0000 mg | Freq: Four times a day (QID) | RECTAL | Status: DC | PRN

## 2014-06-22 MED ORDER — PRAVASTATIN SODIUM 40 MG PO TABS
40.0000 mg | ORAL_TABLET | Freq: Every day | ORAL | Status: DC
  Administered 2014-06-24 – 2014-06-28 (×4): 40 mg via ORAL
  Filled 2014-06-22 (×6): qty 1

## 2014-06-22 MED ORDER — CALCIUM CARBONATE 1250 MG/5ML PO SUSP: 1500.0000 mg | Freq: Four times a day (QID) | ORAL | Status: DC | PRN

## 2014-06-22 MED ORDER — VANCOMYCIN HCL IN DEXTROSE 1-5 GM/200ML-% IV SOLN
1000.0000 mg | INTRAVENOUS | Status: DC
  Filled 2014-06-22: qty 200

## 2014-06-22 MED ORDER — METHYLPREDNISOLONE SODIUM SUCC 40 MG IJ SOLR
40.0000 mg | Freq: Once | INTRAMUSCULAR | Status: AC
  Administered 2014-06-22: 40 mg via INTRAVENOUS
  Filled 2014-06-22: qty 1

## 2014-06-22 MED ORDER — VITAMIN D-3 125 MCG (5000 UT) PO TABS: 5000.0000 [IU] | ORAL_TABLET | Freq: Every day | ORAL | Status: DC

## 2014-06-22 MED ORDER — DARBEPOETIN ALFA 100 MCG/0.5ML IJ SOSY: 100.0000 ug | PREFILLED_SYRINGE | INTRAMUSCULAR | Status: DC

## 2014-06-22 MED ORDER — AMIODARONE HCL 100 MG PO TABS
100.0000 mg | ORAL_TABLET | Freq: Every day | ORAL | Status: DC
  Administered 2014-06-24 – 2014-06-28 (×4): 100 mg via ORAL
  Filled 2014-06-22 (×6): qty 1

## 2014-06-22 MED ORDER — OXYCODONE HCL 5 MG PO TABS
10.0000 mg | ORAL_TABLET | Freq: Once | ORAL | Status: AC
  Administered 2014-06-22: 10 mg via ORAL
  Filled 2014-06-22: qty 2

## 2014-06-22 MED ORDER — ONDANSETRON HCL 4 MG/2ML IJ SOLN: 4.0000 mg | Freq: Four times a day (QID) | INTRAMUSCULAR | Status: DC | PRN

## 2014-06-22 MED ORDER — VITAMIN D3 25 MCG (1000 UNIT) PO TABS
5000.0000 [IU] | ORAL_TABLET | Freq: Every day | ORAL | Status: DC
  Administered 2014-06-24 – 2014-06-28 (×4): 5000 [IU] via ORAL
  Filled 2014-06-22 (×6): qty 5

## 2014-06-22 MED ORDER — SACCHAROMYCES BOULARDII 250 MG PO CAPS
250.0000 mg | ORAL_CAPSULE | Freq: Two times a day (BID) | ORAL | Status: DC
  Administered 2014-06-22 – 2014-06-28 (×10): 250 mg via ORAL
  Filled 2014-06-22 (×13): qty 1

## 2014-06-22 MED ORDER — SODIUM CHLORIDE 0.9 % IV BOLUS (SEPSIS): 250.0000 mL | Freq: Once | INTRAVENOUS | Status: DC

## 2014-06-22 MED ORDER — SODIUM CHLORIDE 0.9 % IV BOLUS (SEPSIS)
500.0000 mL | Freq: Once | INTRAVENOUS | Status: AC
  Administered 2014-06-22: 500 mL via INTRAVENOUS

## 2014-06-22 MED ORDER — TETANUS-DIPHTH-ACELL PERTUSSIS 5-2.5-18.5 LF-MCG/0.5 IM SUSP
0.5000 mL | Freq: Once | INTRAMUSCULAR | Status: AC
  Administered 2014-06-22: 0.5 mL via INTRAMUSCULAR
  Filled 2014-06-22: qty 0.5

## 2014-06-22 MED ORDER — DOXERCALCIFEROL 4 MCG/2ML IV SOLN
1.0000 ug | INTRAVENOUS | Status: DC
  Filled 2014-06-22: qty 2

## 2014-06-22 MED ORDER — SODIUM CHLORIDE 0.9 % IJ SOLN
3.0000 mL | Freq: Two times a day (BID) | INTRAMUSCULAR | Status: DC
  Administered 2014-06-22 – 2014-06-25 (×2): 3 mL via INTRAVENOUS

## 2014-06-22 NOTE — ED Notes (Signed)
Called phlebotomy to come draw blood.

## 2014-06-22 NOTE — ED Notes (Signed)
Pt wound irrigated thoroughly with sterile saline. Pt wound dressed with adaptic gauze and covered with a curlex dressing. Pt tolerated well.

## 2014-06-22 NOTE — H&P (Signed)
History and Physical  Chad [name] AJG:[id number] DOB: 09-11-1955 DOA: 06/22/2014   PCP: Ulla Potash., MD  Referring Physician: ED/ Dr. Regenia Skeeter  Chief Complaint: mechanical fall with left leg laceration  HPI:  59 year old male with a history of nonischemic cardiomyopathy, paroxysmal atrial flutter not on anticoagulation secondary to GI bleed, ESRD (MWF) with failed renal transplant (1996), hepatitis C, and osteomyelitis of the left femur. The patient initially had a distal femur fracture in June 2014 and underwent ORIF at that time. He subsequently developed a wound infection and underwent irrigation and debridement. He had been following infectious disease. On 03/14/2014, the patient had his hardware removed and had placement of antibiotic beads. He was seen in the orthopedic office on 06/07/2014. He was noted to have purulent drainage. He was admitted on 06/08/2014 for repeat irrigation and debridement. Postoperatively he was extubated successfully. Unfortunately, the patient developed hypotension that was refractory to 3 doses of colloid infusion. Critical care was consulted, and the patient was transferred to the ICU where he was placed on vasopressors. It was thought that the patient was possibly septic secondary to his wound infection and osteomyelitis as well as residual effects from his anesthesia. He was initially placed on stress steroids which have been weaned to his baseline dose. He has been since weaned off of his vasopressors and was transferred out of the ICU on 06/10/2014. During that admission, internal medicine consultation was obtained for medical management. As a result, internal medicine consultation was obtained for medical management. The patient's anti-hypertensive regimen was adjusted. More specifically his amlodipine and hydralazine were discontinued. The patient was discharged on 06/19/2014 with instructions to finish 6 weeks of ceftazidime and vancomycin. Wound  cultures obtained intraoperatively grew Proteus mirabilis. The patient was discharged to go for healthcare. Unfortunately, the patient had a mechanical fall during therapy.  The patient hit his left leg on to some type of real causing complicated laceration. He is brought to the emergency department. His orthopedic surgeon, Dr. Marcelino Scot was consulted and plans to close the patient's wound in the operating room. TR[facility] was requested to admit patient due to his multiple medical issues.  In the emergency department, the patient's blood pressure was initially 74/45. It did gradually improved to 88/57. The patient's oxygen saturation was 98-100% on room air. He was afebrile otherwise. Labs revealed WBC 18.3, sodium 134, serum creatinine 3.5. Assessment/Plan: Hypotension -Multifactorial including possible sepsis, opioids volume depletion, cardiomyopathy, and possible adrenal insufficiency in the setting of 3 antihypertensive medications -give 500cc NS and recheck -At baseline, the patient's systolic blood pressure usually is in the mid 90s to low 100s -Obtain blood cultures 2 sets, check lactic acid -Give the patient 1 time dose of stress to her right, Solu-Medrol 40 mg IV as the patient is being prepared for surgical repair and he is on chronic prednisone -Discontinue Imdur and carvedilol for now. Continue amiodarone if his blood pressure is able to tolerate. -His previous amlodipine and hydralazine were discontinued during his last hospital admission Laceration left lower extremity -Dr. Marcelino Scot was already consulted by EDP, Dr. Regenia Skeeter and instructed pt to remain npo for now for possible OR repair -wound care per Dr. Marcelino Scot Osteomyelitis left femur -Status post I&D 06/08/2014, Dr. Marcelino Scot -Wound care per orthopedics -Continue ceftazidime and vancomycin on dialysis -patient was previously seen by infectious disease on his last admission whom recommended 6 weeks of intravenous antibiotics-Blood cultures remain  negative -Patient is afebrile with soft blood pressures remained soft -Physical  therapy evaluation when orthopedics feels the patient is stable -ESR 38, CRP 5.0 Chronic systolic CHF/nonischemic cardiomyopathy -Previously had EF as low as 15-20% -Follows Dr. Haroldine Laws -Most recent echocardiogram 06/13/2013 showed EF 60% - resume lower dose carvedilol and resume Imdur once blood pressure allows  -has had Unna boots on his bilateral lower extremities for worsening leg edema for the past month which was attributable to the patient's worsening renal function  -He is not in any respiratory distress--his mild hypoxemia likely due to atelectasis --oxygen saturation 98-99 percent on RA -Encourage incentive spirometry Thrombocytopenia -This has been chronic dating back to November 2013 -likely due to chronic hep C with 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 -largely stable since admission -Intermittent worsening likely due to the patient's infectious process with associated myelosuppression -Monitor for bleeding complications -K27 on 0/62/37 was low normal at 251--add B12 supplement po -HIT panel if continued drop 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 -Dialysis per nephrology--already spoke with Dr. Moshe Cipro for consult -Patient has only resumed hemodialysis within the past month  -AV fistula creation 62/83/1517  -Metabolic bone disease per nephrology -Dialysis Monday, Wednesday, Friday -Continue chronic home dose of prednisone 5 mg daily after surgery Paroxysmal atrial flutter  -Presently in sinus [place]  -Not on anticoagulation secondary to GI bleed  -restart aspirin after surgery -resume carvedilol when BP allows -Continue amiodarone  Anemia of CKD -Patient's baseline hemoglobin has had wide variations but baseline seems to be around 11 -Drop in hemoglobin some blood loss -Aranesp per nephrology -Repeat CBC in a.m.--transfuse for hemoglobin less than 7  Hypertension -resume  carvedilol and imdur when able Hyperlipidemia -Continue pravastatin        Past Medical History  Diagnosis Date  . CKD (chronic kidney disease), stage IV     a. s/p renal transplant 1996.  Marland Kitchen HLD (hyperlipidemia)     takes Pravastatin daily  . Nonischemic cardiomyopathy     a. unknown etiology, EF 30-35% by echo 09/2011;  b. 09/2011 Lexi MV EF 42%, no ischemia/infarct.  . Systolic CHF     takes Lasix daily  . Atrial flutter     takes Amiodarone daily  . Gout     takes Allopurinol daily  . Hypertension     takes Amlodipine,Hydralazine,Imdur,and Coreg daily  . GERD (gastroesophageal reflux disease)     takes Protonix daily  . Shortness of breath dyspnea     exertion  . Pneumonia 2014  . History of bronchitis     several yrs ago  . Joint pain     right shoulder and left knee  . Sciatica   . Hepatitis C 1996    Hep C  . History of colon polyps   . History of kidney stones   . Urinary frequency   . History of shingles   . Hyperparathyroidism due to renal insufficiency   . Sleep apnea     does not use cpap  . Wound of left leg    Past Surgical History  Procedure Laterality Date  . Nephrectomy transplanted organ Right 49yr ago  . Insertion of dialysis catheter  06/20/2011    Procedure: INSERTION OF DIALYSIS CATHETER;  Surgeon: VSerafina Mitchell MD;  [facility]  Service: Vascular;  Laterality: Right;  Ultrasound guided insertion of right internal jugular dialysis catheter  . Multiple extractions with alveoloplasty  06/27/2011    Procedure: MULTIPLE EXTRACION WITH ALVEOLOPLASTY;  Surgeon: RLenn Cal DDS;  [place] [facility]  Service: Oral Surgery;  Laterality: N/A;  Extraction  of tooth # 14 with alveoloplasty  . Hip surgery Left     replacement  . Joint replacement      left hip  . Femur fracture surgery Left 07/09/2012    Dr Sherrian Divers  . Femur im nail Left 07/10/2012    ORIF L distal femur;  Surgeon: Wylene Simmer, MD;  Location: [place];  Service: Orthopedics;   Laterality: Left;  . I&d extremity Left 08/24/2012    Procedure: LEFT LEG IRRIGATION AND DEBRIDEMENT WITH POSSIBLE WOUND VAC APPLICATION;  Surgeon: Wylene Simmer, MD;  Location: [place];  Service: Orthopedics;  Laterality: Left;  . Tracheostomy  2014  . Tracheostomy closure  2014    2 wks after it was done  . Colonsocopy    . Non union Left 03/14/2014    LEFT FEMER REPAIR     BY DR HANDY  . Hardware removal Left 03/14/2014    Procedure: HARDWARE REMOVAL LEFT;  Surgeon: Altamese [place], MD;  Location: [place];  Service: Orthopedics;  Laterality: Left;  . Wound debridement Left 03/14/2014    Procedure: DEBRIDEMENT WOUND;  Surgeon: Altamese [place], MD;  Location: [place];  Service: Orthopedics;  Laterality: Left;  INSERTION OF  ANTIBIOTIC Beads  (2 strands: 15 beads and 14 beads)  . Av fistula placement Left 06/01/2014    Procedure: LEFT BRACHIOCEPHALIC ARTERIOVENOUS (AV) FISTULA CREATION;  Surgeon: Mal Misty, MD;  Location: [place];  Service: Vascular;  Laterality: Left;  . Incision and drainage Left 06/08/2014    Procedure: INCISION AND DRAINAGE OF LEFT THIGH;  Surgeon: Altamese Fouke, MD;  Location: [place];  Service: Orthopedics;  Laterality: Left;   Social History:  reports that he has never smoked. He has never used smokeless tobacco. He reports that he does not drink alcohol or use illicit drugs.   Family History  Problem Relation Age of Onset  . Heart disease Neg Hx      No Known Allergies    Prior to Admission medications   Medication Sig Start Date End Date Taking? Authorizing Provider  allopurinol (ZYLOPRIM) 100 MG tablet Take 1 tablet (100 mg total) by mouth 2 (two) times daily. 08/05/12  Yes Daniel J Angiulli, PA-C  amiodarone (PACERONE) 100 MG tablet Take 1 tablet (100 mg total) by mouth daily. 06/05/14  Yes Jolaine Artist, MD  aspirin 325 MG tablet Take 325 mg by mouth daily.   Yes Historical Provider, MD  calcitRIOL (ROCALTROL) 0.5 MCG capsule Take 0.5 mcg by mouth daily. 02/02/14  Yes  Historical Provider, MD  calcium carbonate, dosed in mg elemental calcium, 1250 (500 CA) MG/5ML Take 15 mLs (1,500 mg of elemental calcium total) by mouth every 6 (six) hours as needed for indigestion. 06/19/14  Yes Ainsley Spinner, PA-C  carvedilol (COREG) 3.125 MG tablet Take 1 tablet (3.125 mg total) by mouth 2 (two) times daily with a meal. 06/16/14  Yes Ainsley Spinner, PA-C  cefTAZidime 2 g in dextrose 5 % 50 mL Inject 2 g into the vein every Monday, Wednesday, and Friday with hemodialysis. 06/16/14  Yes Ainsley Spinner, PA-C  Cholecalciferol (VITAMIN D-3) 5000 UNITS TABS Take 5,000 Units by mouth daily.    Yes Historical Provider, MD  doxercalciferol (HECTOROL) 4 MCG/2ML injection Inject 0.5 mLs (1 mcg total) into the vein every Monday, Wednesday, and Friday with hemodialysis. 06/16/14  Yes Ainsley Spinner, PA-C  isosorbide mononitrate (IMDUR) 60 MG 24 hr tablet TAKE 1 TABLETS BY MOUTH EVERY DAY 06/19/14  Yes Ainsley Spinner, PA-C  oxyCODONE (OXY IR/ROXICODONE) 5 MG immediate release tablet Take 1-2 tablets (5-10 mg total) by mouth every 3 (three) hours as needed for breakthrough pain. 06/16/14  Yes Ainsley Spinner, PA-C  pravastatin (PRAVACHOL) 40 MG tablet TAKE 1 TABLET (40 MG TOTAL) BY MOUTH DAILY.   Yes Larey Dresser, MD  predniSONE (DELTASONE) 5 MG tablet Take 1 tablet (5 mg total) by mouth daily with breakfast. 06/16/14  Yes Ainsley Spinner, PA-C  saccharomyces boulardii (FLORASTOR) 250 MG capsule Take 1 capsule (250 mg total) by mouth 2 (two) times daily. 06/19/14  Yes Ainsley Spinner, PA-C  traMADol (ULTRAM) 50 MG tablet Take 1 tablet (50 mg total) by mouth every 6 (six) hours as needed (pain). As needed for pain 06/16/14  Yes Ainsley Spinner, PA-C  vancomycin [facility]) 1 GM/200ML SOLN Inject 200 mLs (1,000 mg total) into the vein every Monday, Wednesday, and Friday with hemodialysis. 06/16/14  Yes Ainsley Spinner, PA-C  multivitamin (RENA-VIT) TABS tablet Take 1 tablet by mouth at bedtime. Patient not taking: Reported on 06/22/2014 06/16/14    Ainsley Spinner, PA-C    Review of Systems:  Constitutional:  No weight loss, night sweats, Fevers, chills, fatigue.  Head&Eyes: No headache.  No vision loss.  No eye pain or scotoma ENT:  No Difficulty swallowing,Tooth/dental problems,Sore throat,  No ear ache, post nasal drip,  Cardio-vascular:  No chest pain, Orthopnea, PND, swelling in lower extremities,  dizziness, palpitations  GI:  No  abdominal pain, nausea, vomiting, diarrhea, loss of appetite, hematochezia, melena, heartburn, indigestion, Resp:  No shortness of breath with exertion or at rest. No cough. No coughing up of blood .No wheezing.No chest wall deformity  Skin:  no rash or lesions.  GU:  no dysuria, change in color of urine, no urgency or frequency. No flank pain.  Musculoskeletal:  No joint pain or swelling. No decreased range of motion. No back pain.  Psych:  No change in mood or affect. No depression or anxiety. Neurologic: No headache, no dysesthesia, no focal weakness, no vision loss. No syncope  Physical Exam: Filed Vitals:   06/22/14 1430 06/22/14 1435 06/22/14 1530 06/22/14 1545  BP: _0 88/57  Pulse: 95 94 93 94  Temp:  98.1 F (36.7 C)    TempSrc:  Oral    Resp:  16    Height:      Weight:      SpO2: 100% 100% 100% 100%   General:  A&O x 3, NAD, nontoxic, pleasant/cooperative Head/Eye: No conjunctival hemorrhage, no icterus, [place]/AT, No nystagmus ENT:  No icterus,  No thrush, good dentition, no pharyngeal exudate Neck:  No masses, no lymphadenpathy, no bruits CV:  RRR, no rub, no gallop, no S3 Lung:  CTAB, good air movement, no wheeze, no rhonchi Abdomen: soft/NT, +BS, nondistended, no peritoneal signs Ext: No cyanosis, No rashes, No petechiae, No lymphangitis, 2+ edema b/l LE; complicated laceration left lower extremity below the knee without any active bleeding.   Labs on Admission:  Basic Metabolic Panel:  Recent Labs Lab 06/16/14 0724 06/17/14 0530 06/19/14 0813  06/22/14 1354  NA 132* 130* 131* 134*  K 3.8 4.8 4.1 3.9  CL 97* 94* 94* 97*  CO2 _1 GLUCOSE 82 91 92 92  BUN 14 7 31* 14  CREATININE 3.74* 3.14* 5.42* 3.85*  CALCIUM 8.9 8.9 9.6 9.2  PHOS 3.6  --  3.3  --    Liver Function Tests:  Recent Labs Lab 06/16/14 0724 06/19/14 0813  ALBUMIN 2.1* 2.2*   No results for input(s): LIPASE, AMYLASE in the last 168 hours. No results for input(s): AMMONIA in the last 168 hours. CBC:  Recent Labs Lab 06/16/14 0723 06/17/14 0530 06/19/14 0812 06/22/14 1354  WBC 13.2* 15.3* 13.2* 18.3*  NEUTROABS  --   --   --  13.5*  HGB 8.6* 9.2* 9.1* 9.9*  HCT 28.1* 29.1* 28.8* 31.7*  MCV 95.3 97.3 95.4 97.5  PLT 184 164 172 133*   Cardiac Enzymes: No results for input(s): CKTOTAL, CKMB, CKMBINDEX, TROPONINI in the last 168 hours. BNP: Invalid input(s): POCBNP CBG: No results for input(s): GLUCAP in the last 168 hours.  Radiological Exams on Admission: Dg Tibia/fibula Left  06/22/2014   CLINICAL DATA:  Deep laceration to left lower leg  EXAM: LEFT TIBIA AND FIBULA - 2 VIEW  COMPARISON:  06/08/2014  FINDINGS: Postoperative changes are again seen in the distal left femur stable from the prior exam. Diffuse soft tissue swelling is noted in the calf. A large skin laceration is noted consistent with the patient's given clinical history. No underlying bony abnormality is seen. No erosion to suggest osteomyelitis is seen. An old healing distal fibular fracture is noted.  IMPRESSION: Soft tissue defect consistent with the given clinical history. Postoperative changes in the distal femur are noted. Healing distal fibular fracture is seen as well.   Electronically Signed   By: Inez Catalina M.D.   On: 06/22/2014 14:22        Time spent:60 minutes Code Status:   FULL Family Communication:   Wife update at bedside   Lemoyne Nestor, DO  [name] Pager [phone]  If 7PM-7AM, please contact night-coverage [email or web address] Password  [facility] 06/22/2014, 4:03 PM

## 2014-06-22 NOTE — Progress Notes (Signed)
Consent not signed, patient states he has not been given information about procedure.

## 2014-06-22 NOTE — Progress Notes (Signed)
CRITICAL VALUE ALERT  Critical value received:  Lactic Acid = 2.2  Date of notification:  06-22-2014  Time of notification:  17:24  Critical value read back:Yes.    Nurse who received alert:  Meridee Score, RN  MD notified (1st page):  Tat  Time of first page:  17:26  MD notified (2nd page):  Time of second page:  Responding MD:  Tat  Time MD responded:  17:26

## 2014-06-22 NOTE — ED Notes (Signed)
Pt arrives from [facility] via [place]. Pt has a deep lac to his left lower leg approx. 7 inches in length about 3 inches in diameter in the largest opening. Pt states he's not having pain, but a burning sensation. [facility] states pt lost about 200 mL of blood en route. Bleeding is well controlled now and wound is dressed with wet gauze. Pt is a&o x4.

## 2014-06-22 NOTE — Progress Notes (Signed)
Admission note:  Arrival Method: ED stretcher  Mental Orientation: alert and oriented x 4  Telemetry: box #25 applied and CCMD notified  Assessment: completed Skin: left lower leg laceration- reason for admission. Wound wrapped downstairs in ED per MD order; possible surgery tonight  IV: Right AC with normal saline bolus finishing  Pain: pt denies pain at this time; pt states there is only pain when his leg is manipulated Tubes: N/A Safety Measures: Patient Handbook has been given, and discussed the Fall Prevention worksheet. Left at bedside  Admission: Completed and admission orders have been written  6E Orientation: Patient has been oriented to the unit, staff and to the room.  Family: At the bedside; wife    Modena Nunnery BSN, RN [employer] Phone [phone]

## 2014-06-22 NOTE — ED Provider Notes (Signed)
CSN: [id number]     Arrival date & time 06/22/14  1219 History   First MD Initiated Contact with Patient 06/22/14 1240     Chief Complaint  Patient presents with  . Leg Injury     (Consider location/radiation/quality/duration/timing/severity/associated sxs/prior Treatment) HPI  59 year old male with a complex past medical history presents after a fall at his rehabilitation facility resulting in a left lower leg laceration. Patient states started to feel weak on his legs which is not atypical for him and as he was sitting back down into a chair his left leg hit a metal pole and he sustained a laceration. States it burns a little but denies significant pain. No weakness or numbness currently. Patient has CK D and last got dialysis yesterday. He was recently discharged from the hospital after having surgery for osteomyelitis.  Past Medical History  Diagnosis Date  . CKD (chronic kidney disease), stage IV     a. s/p renal transplant 1996.  Marland Kitchen HLD (hyperlipidemia)     takes Pravastatin daily  . Nonischemic cardiomyopathy     a. unknown etiology, EF 30-35% by echo 09/2011;  b. 09/2011 Lexi MV EF 42%, no ischemia/infarct.  . Systolic CHF     takes Lasix daily  . Atrial flutter     takes Amiodarone daily  . Gout     takes Allopurinol daily  . Hypertension     takes Amlodipine,Hydralazine,Imdur,and Coreg daily  . GERD (gastroesophageal reflux disease)     takes Protonix daily  . Shortness of breath dyspnea     exertion  . Pneumonia 2014  . History of bronchitis     several yrs ago  . Joint pain     right shoulder and left knee  . Sciatica   . Hepatitis C 1996    Hep C  . History of colon polyps   . History of kidney stones   . Urinary frequency   . History of shingles   . Hyperparathyroidism due to renal insufficiency   . Sleep apnea     does not use cpap  . Wound of left leg    Past Surgical History  Procedure Laterality Date  . Nephrectomy transplanted organ Right 96yrs ago    . Insertion of dialysis catheter  06/20/2011    Procedure: INSERTION OF DIALYSIS CATHETER;  Surgeon: [name], MD;  Location: [facility] OR;  Service: Vascular;  Laterality: Right;  Ultrasound guided insertion of right internal jugular dialysis catheter  . Multiple extractions with alveoloplasty  06/27/2011    Procedure: MULTIPLE EXTRACION WITH ALVEOLOPLASTY;  Surgeon: [name], DDS;  Location: [facility] OR;  Service: Oral Surgery;  Laterality: N/A;  Extraction  of tooth # 14 with alveoloplasty  . Hip surgery Left     replacement  . Joint replacement      left hip  . Femur fracture surgery Left 07/09/2012    Dr Deno Etienne  . Femur im nail Left 07/10/2012    ORIF L distal femur;  Surgeon: Toni Arthurs, MD;  Location: [facility] OR;  Service: Orthopedics;  Laterality: Left;  . I&d extremity Left 08/24/2012    Procedure: LEFT LEG IRRIGATION AND DEBRIDEMENT WITH POSSIBLE WOUND VAC APPLICATION;  Surgeon: Toni Arthurs, MD;  Location: [facility] OR;  Service: Orthopedics;  Laterality: Left;  . Tracheostomy  2014  . Tracheostomy closure  2014    2 wks after it was done  . Colonsocopy    . Non union Left 03/14/2014    LEFT  FEMER REPAIR     BY DR HANDY  . Hardware removal Left 03/14/2014    Procedure: HARDWARE REMOVAL LEFT;  Surgeon: Myrene Galas, MD;  Location: [facility] OR;  Service: Orthopedics;  Laterality: Left;  . Wound debridement Left 03/14/2014    Procedure: DEBRIDEMENT WOUND;  Surgeon: Myrene Galas, MD;  Location: [facility] OR;  Service: Orthopedics;  Laterality: Left;  INSERTION OF  ANTIBIOTIC Beads  (2 strands: 15 beads and 14 beads)  . Av fistula placement Left 06/01/2014    Procedure: LEFT BRACHIOCEPHALIC ARTERIOVENOUS (AV) FISTULA CREATION;  Surgeon: Pryor Ochoa, MD;  Location: [facility] OR;  Service: Vascular;  Laterality: Left;  . Incision and drainage Left 06/08/2014    Procedure: INCISION AND DRAINAGE OF LEFT THIGH;  Surgeon: Myrene Galas, MD;  Location: [facility] OR;  Service: Orthopedics;  Laterality: Left;   Family History   Problem Relation Age of Onset  . Heart disease Neg Hx    History  Substance Use Topics  . Smoking status: Never Smoker   . Smokeless tobacco: Never Used  . Alcohol Use: No    Review of Systems  Constitutional: Negative for fever.  Musculoskeletal: Negative for arthralgias.  Skin: Positive for wound.  Neurological: Positive for weakness. Negative for numbness.  All other systems reviewed and are negative.     Allergies  Review of patient's allergies indicates no known allergies.  Home Medications   Prior to Admission medications   Medication Sig Start Date End Date Taking? Authorizing Provider  acetaminophen (TYLENOL) 325 MG tablet Take 2 tablets (650 mg total) by mouth every 6 (six) hours as needed for moderate pain or fever. 03/22/14   Montez Morita, PA-C  allopurinol (ZYLOPRIM) 100 MG tablet Take 1 tablet (100 mg total) by mouth 2 (two) times daily. 08/05/12   Mcarthur Rossetti Angiulli, PA-C  amiodarone (PACERONE) 100 MG tablet Take 1 tablet (100 mg total) by mouth daily. 06/05/14   Dolores Patty, MD  aspirin 325 MG tablet Take 325 mg by mouth daily.    Historical Provider, MD  calcitRIOL (ROCALTROL) 0.5 MCG capsule Take 0.5 mcg by mouth daily. 02/02/14   Historical Provider, MD  calcium carbonate, dosed in mg elemental calcium, 1250 (500 CA) MG/5ML Take 15 mLs (1,500 mg of elemental calcium total) by mouth every 6 (six) hours as needed for indigestion. 06/19/14   Montez Morita, PA-C  carvedilol (COREG) 3.125 MG tablet Take 1 tablet (3.125 mg total) by mouth 2 (two) times daily with a meal. 06/16/14   Montez Morita, PA-C  cefTAZidime 2 g in dextrose 5 % 50 mL Inject 2 g into the vein every Monday, Wednesday, and Friday with hemodialysis. 06/16/14   Montez Morita, PA-C  Cholecalciferol (VITAMIN D-3) 5000 UNITS TABS Take 5,000 Units by mouth daily.     Historical Provider, MD  docusate sodium (COLACE) 100 MG capsule Take 1 capsule (100 mg total) by mouth 2 (two) times daily. 03/22/14   Montez Morita, PA-C   doxercalciferol (HECTOROL) 4 MCG/2ML injection Inject 0.5 mLs (1 mcg total) into the vein every Monday, Wednesday, and Friday with hemodialysis. 06/16/14   Montez Morita, PA-C  isosorbide mononitrate (IMDUR) 60 MG 24 hr tablet TAKE 1 TABLETS BY MOUTH EVERY DAY 06/19/14   Montez Morita, PA-C  multivitamin (RENA-VIT) TABS tablet Take 1 tablet by mouth at bedtime. 06/16/14   Montez Morita, PA-C  oxyCODONE (OXY IR/ROXICODONE) 5 MG immediate release tablet Take 1-2 tablets (5-10 mg total) by mouth every 3 (three) hours as needed  for breakthrough pain. 06/16/14   Montez Morita, PA-C  pravastatin (PRAVACHOL) 40 MG tablet TAKE 1 TABLET (40 MG TOTAL) BY MOUTH DAILY.    Laurey Morale, MD  predniSONE (DELTASONE) 5 MG tablet Take 1 tablet (5 mg total) by mouth daily with breakfast. 06/16/14   Montez Morita, PA-C  Probiotic Product (PROBIOTIC DAILY PO) Take 1 capsule by mouth daily.     Historical Provider, MD  saccharomyces boulardii (FLORASTOR) 250 MG capsule Take 1 capsule (250 mg total) by mouth 2 (two) times daily. 06/19/14   Montez Morita, PA-C  traMADol (ULTRAM) 50 MG tablet Take 1 tablet (50 mg total) by mouth every 6 (six) hours as needed (pain). As needed for pain 06/16/14   Montez Morita, PA-C  vancomycin [facility]) 1 GM/200ML SOLN Inject 200 mLs (1,000 mg total) into the vein every Monday, Wednesday, and Friday with hemodialysis. 06/16/14   Montez Morita, PA-C   BP 87/56 mmHg  Pulse 101  Resp 18  Ht 6' (1.829 m)  Wt 230 lb (104.327 kg)  BMI 31.19 kg/m2  SpO2 100% Physical Exam  Constitutional: He is oriented to person, place, and time. He appears well-developed and well-nourished.  HENT:  Head: Normocephalic and atraumatic.  Right Ear: External ear normal.  Left Ear: External ear normal.  Nose: Nose normal.  Eyes: Right eye exhibits no discharge. Left eye exhibits no discharge.  Neck: Neck supple.  Cardiovascular: Normal rate and intact distal pulses.   Pulmonary/Chest: Effort normal.  Abdominal: Soft. He exhibits  no distension. There is no tenderness.  Musculoskeletal:       Left knee: No tenderness found.       Left ankle: No tenderness.       Left lower leg: He exhibits tenderness, edema and laceration.  Neurological: He is alert and oriented to person, place, and time.  Skin: Skin is warm and dry.  Nursing note and vitals reviewed.  Laceration measures 13 cm x 8 cm at widest margins.   ED Course  Procedures (including critical care time) Labs Review Labs Reviewed  BASIC METABOLIC PANEL - Abnormal; Notable for the following:    Sodium 134 (*)    Chloride 97 (*)    Creatinine, Ser 3.85 (*)    GFR calc non Af Amer 16 (*)    GFR calc Af Amer 19 (*)    All other components within normal limits  CBC WITH DIFFERENTIAL/PLATELET - Abnormal; Notable for the following:    WBC 18.3 (*)    RBC 3.25 (*)    Hemoglobin 9.9 (*)    HCT 31.7 (*)    RDW 19.3 (*)    Platelets 133 (*)    Neutro Abs 13.5 (*)    Monocytes Absolute 2.1 (*)    All other components within normal limits  CULTURE, BLOOD (ROUTINE X 2)  CULTURE, BLOOD (ROUTINE X 2)  LACTIC ACID, PLASMA    Imaging Review Dg Tibia/fibula Left  06/22/2014   CLINICAL DATA:  Deep laceration to left lower leg  EXAM: LEFT TIBIA AND FIBULA - 2 VIEW  COMPARISON:  06/08/2014  FINDINGS: Postoperative changes are again seen in the distal left femur stable from the prior exam. Diffuse soft tissue swelling is noted in the calf. A large skin laceration is noted consistent with the patient's given clinical history. No underlying bony abnormality is seen. No erosion to suggest osteomyelitis is seen. An old healing distal fibular fracture is noted.  IMPRESSION: Soft tissue defect consistent with the given  clinical history. Postoperative changes in the distal femur are noted. Healing distal fibular fracture is seen as well.   Electronically Signed   By: Alcide Clever M.D.   On: 06/22/2014 14:22     EKG Interpretation None      MDM   Final diagnoses:   Laceration of lower leg, left, complicated, initial encounter    Patient with a fall and complicated left lower leg laceration. Does have port or line blood pressures but he states this is normal for him and he is currently asymptomatic. I believe this is related to his chronic kidney disease and dialysis. I discussed his case with his orthopedic surgeon, Dr. Carola Frost, who recommends irrigation of wound, Adaptic gauze and kerlex and keeping patient NPO. Will need OR washout and repair given complex nature of wound. Requests Medicine admit, Dr. Arbutus Leas consulted for admission. Tetanus updated.    Pricilla Loveless, MD 06/22/14 [phone]

## 2014-06-22 NOTE — ED Notes (Signed)
BP is 87/50. Misty Stanley, [place] notitifed

## 2014-06-22 NOTE — Progress Notes (Signed)
[facility]   Pt well known to [facility] Reviewed case with EDP, pictures of wound reviewed as well Wound irrigated at bedside and dressing applied Plan for formal I&D tomorrow am in OR  Pt on ABX for osteomyelitis, receives during HD.   NPO after MN  Mearl Latin, PA-C [facility] [phone] (P) 06/22/2014 7:45 PM

## 2014-06-23 ENCOUNTER — Inpatient Hospital Stay (HOSPITAL_COMMUNITY): Payer: Medicare HMO | Admitting: Certified Registered Nurse Anesthetist

## 2014-06-23 ENCOUNTER — Inpatient Hospital Stay (HOSPITAL_COMMUNITY): Payer: Medicare HMO

## 2014-06-23 ENCOUNTER — Encounter (HOSPITAL_COMMUNITY): Payer: Self-pay | Admitting: Certified Registered Nurse Anesthetist

## 2014-06-23 ENCOUNTER — Encounter (HOSPITAL_COMMUNITY)
Admission: EM | Disposition: A | Discharge status: Skilled Nursing Facility | Payer: Self-pay | Source: Home / Self Care | Attending: Internal Medicine

## 2014-06-23 DIAGNOSIS — S81812D Laceration without foreign body, left lower leg, subsequent encounter: Secondary | ICD-10-CM

## 2014-06-23 DIAGNOSIS — S81812A Laceration without foreign body, left lower leg, initial encounter: Secondary | ICD-10-CM

## 2014-06-23 DIAGNOSIS — I9589 Other hypotension: Secondary | ICD-10-CM

## 2014-06-23 HISTORY — PX: I & D EXTREMITY: SHX5045

## 2014-06-23 LAB — CBC
HEMATOCRIT: 26.7 % — AB (ref 39.0–52.0)
Hemoglobin: 8.2 g/dL — ABNORMAL LOW (ref 13.0–17.0)
MCH: 29.7 pg (ref 26.0–34.0)
MCHC: 30.7 g/dL (ref 30.0–36.0)
MCV: 96.7 fL (ref 78.0–100.0)
PLATELETS: 143 10*3/uL — AB (ref 150–400)
RBC: 2.76 MIL/uL — ABNORMAL LOW (ref 4.22–5.81)
RDW: 19.5 % — ABNORMAL HIGH (ref 11.5–15.5)
WBC: 18.4 10*3/uL — ABNORMAL HIGH (ref 4.0–10.5)

## 2014-06-23 LAB — RENAL FUNCTION PANEL
ALBUMIN: 2 g/dL — AB (ref 3.5–5.0)
Anion gap: 11 (ref 5–15)
BUN: 26 mg/dL — ABNORMAL HIGH (ref 6–20)
CO2: 24 mmol/L (ref 22–32)
Calcium: 8.9 mg/dL (ref 8.9–10.3)
Chloride: 98 mmol/L — ABNORMAL LOW (ref 101–111)
Creatinine, Ser: 5.15 mg/dL — ABNORMAL HIGH (ref 0.61–1.24)
GFR calc Af Amer: 13 mL/min — ABNORMAL LOW (ref >60)
GFR calc non Af Amer: 11 mL/min — ABNORMAL LOW (ref >60)
Glucose, Bld: 117 mg/dL — ABNORMAL HIGH (ref 65–99)
POTASSIUM: 4.2 mmol/L (ref 3.5–5.1)
Phosphorus: 4.6 mg/dL (ref 2.5–4.6)
SODIUM: 133 mmol/L — AB (ref 135–145)

## 2014-06-23 LAB — POCT I-STAT, CHEM 8
BUN: 27 mg/dL — ABNORMAL HIGH (ref 6–20)
CREATININE: 4.6 mg/dL — AB (ref 0.61–1.24)
Calcium, Ion: 1.26 mmol/L — ABNORMAL HIGH (ref 1.12–1.23)
Chloride: 97 mmol/L — ABNORMAL LOW (ref 101–111)
Glucose, Bld: 137 mg/dL — ABNORMAL HIGH (ref 65–99)
HCT: 27 % — ABNORMAL LOW (ref 39.0–52.0)
Hemoglobin: 9.2 g/dL — ABNORMAL LOW (ref 13.0–17.0)
Potassium: 4.5 mmol/L (ref 3.5–5.1)
Sodium: 136 mmol/L (ref 135–145)
TCO2: 20 mmol/L (ref 0–100)

## 2014-06-23 LAB — VANCOMYCIN, TROUGH: Vancomycin Tr: 15 ug/mL (ref 10.0–20.0)

## 2014-06-23 LAB — VANCOMYCIN, RANDOM: Vancomycin Rm: 15 ug/mL

## 2014-06-23 SURGERY — IRRIGATION AND DEBRIDEMENT EXTREMITY
Anesthesia: General | Site: Leg Lower | Laterality: Left

## 2014-06-23 MED ORDER — HYDROMORPHONE HCL 1 MG/ML IJ SOLN: 0.2500 mg | INTRAMUSCULAR | Status: DC | PRN

## 2014-06-23 MED ORDER — SODIUM CHLORIDE 0.9 % IJ SOLN
10.0000 mL | INTRAMUSCULAR | Status: DC | PRN
  Administered 2014-06-25: 20 mL
  Filled 2014-06-23: qty 40

## 2014-06-23 MED ORDER — PROPOFOL 10 MG/ML IV BOLUS
INTRAVENOUS | Status: DC | PRN
  Administered 2014-06-23: 130 mg via INTRAVENOUS

## 2014-06-23 MED ORDER — LIDOCAINE HCL (PF) 1 % IJ SOLN: 5.0000 mL | INTRAMUSCULAR | Status: DC | PRN

## 2014-06-23 MED ORDER — PHENYLEPHRINE HCL 10 MG/ML IJ SOLN
10.0000 mg | INTRAVENOUS | Status: DC | PRN
  Administered 2014-06-23: 40 ug/min via INTRAVENOUS

## 2014-06-23 MED ORDER — ALTEPLASE 2 MG IJ SOLR
2.0000 mg | Freq: Once | INTRAMUSCULAR | Status: DC | PRN
  Filled 2014-06-23: qty 2

## 2014-06-23 MED ORDER — VANCOMYCIN HCL IN DEXTROSE 1-5 GM/200ML-% IV SOLN
1000.0000 mg | INTRAVENOUS | Status: DC
  Administered 2014-06-23 – 2014-06-26 (×2): 1000 mg via INTRAVENOUS
  Filled 2014-06-23 (×5): qty 200

## 2014-06-23 MED ORDER — PENTAFLUOROPROP-TETRAFLUOROETH EX AERO: 1.0000 "application " | INHALATION_SPRAY | CUTANEOUS | Status: DC | PRN

## 2014-06-23 MED ORDER — NEPRO/CARBSTEADY PO LIQD: 237.0000 mL | ORAL | Status: DC | PRN

## 2014-06-23 MED ORDER — SODIUM CHLORIDE 0.9 % IV SOLN
INTRAVENOUS | Status: DC | PRN
  Administered 2014-06-23: 09:00:00 via INTRAVENOUS

## 2014-06-23 MED ORDER — 0.9 % SODIUM CHLORIDE (POUR BTL) OPTIME
TOPICAL | Status: DC | PRN
  Administered 2014-06-23: 1000 mL

## 2014-06-23 MED ORDER — ONDANSETRON HCL 4 MG/2ML IJ SOLN
INTRAMUSCULAR | Status: DC | PRN
  Administered 2014-06-23: 4 mg via INTRAVENOUS

## 2014-06-23 MED ORDER — PROMETHAZINE HCL 25 MG/ML IJ SOLN: 6.2500 mg | INTRAMUSCULAR | Status: DC | PRN

## 2014-06-23 MED ORDER — DARBEPOETIN ALFA 100 MCG/0.5ML IJ SOSY: 100.0000 ug | PREFILLED_SYRINGE | INTRAMUSCULAR | Status: DC

## 2014-06-23 MED ORDER — LIDOCAINE HCL (CARDIAC) 20 MG/ML IV SOLN
INTRAVENOUS | Status: DC | PRN
  Administered 2014-06-23: 60 mg via INTRAVENOUS

## 2014-06-23 MED ORDER — STERILE WATER FOR INJECTION IJ SOLN
INTRAMUSCULAR | Status: AC
  Filled 2014-06-23: qty 10

## 2014-06-23 MED ORDER — EPHEDRINE SULFATE 50 MG/ML IJ SOLN
INTRAMUSCULAR | Status: AC
  Filled 2014-06-23: qty 1

## 2014-06-23 MED ORDER — FENTANYL CITRATE (PF) 100 MCG/2ML IJ SOLN
INTRAMUSCULAR | Status: DC | PRN
  Administered 2014-06-23 (×2): 25 ug via INTRAVENOUS

## 2014-06-23 MED ORDER — SODIUM CHLORIDE 0.9 % IV SOLN: 100.0000 mL | INTRAVENOUS | Status: DC | PRN

## 2014-06-23 MED ORDER — CHLORHEXIDINE GLUCONATE CLOTH 2 % EX PADS
6.0000 | MEDICATED_PAD | Freq: Every day | CUTANEOUS | Status: AC
  Administered 2014-06-23 – 2014-06-26 (×4): 6 via TOPICAL

## 2014-06-23 MED ORDER — SODIUM CHLORIDE 0.9 % IV SOLN
100.0000 mL | INTRAVENOUS | Status: DC | PRN
Start: 2014-06-23 — End: 2014-06-23

## 2014-06-23 MED ORDER — SIMETHICONE 80 MG PO CHEW
80.0000 mg | CHEWABLE_TABLET | Freq: Once | ORAL | Status: AC
  Administered 2014-06-23: 80 mg via ORAL
  Filled 2014-06-23: qty 1

## 2014-06-23 MED ORDER — HYDROCODONE-ACETAMINOPHEN 7.5-325 MG PO TABS: 1.0000 | ORAL_TABLET | Freq: Once | ORAL | Status: DC | PRN

## 2014-06-23 MED ORDER — LIDOCAINE-PRILOCAINE 2.5-2.5 % EX CREA: 1.0000 "application " | TOPICAL_CREAM | CUTANEOUS | Status: DC | PRN

## 2014-06-23 MED ORDER — DEXTROSE 5 % IV SOLN
1.0000 g | Freq: Once | INTRAVENOUS | Status: AC
  Administered 2014-06-23: 1 g via INTRAVENOUS
  Filled 2014-06-23: qty 1

## 2014-06-23 MED ORDER — MIDAZOLAM HCL 2 MG/2ML IJ SOLN
INTRAMUSCULAR | Status: AC
  Filled 2014-06-23: qty 2

## 2014-06-23 MED ORDER — SODIUM CHLORIDE 0.9 % IJ SOLN
10.0000 mL | Freq: Two times a day (BID) | INTRAMUSCULAR | Status: DC
  Administered 2014-06-23 – 2014-06-25 (×3): 10 mL
  Administered 2014-06-26: 20 mL

## 2014-06-23 MED ORDER — MUPIROCIN 2 % EX OINT
1.0000 "application " | TOPICAL_OINTMENT | Freq: Two times a day (BID) | CUTANEOUS | Status: AC
  Administered 2014-06-23 – 2014-06-26 (×7): 1 via NASAL
  Filled 2014-06-23: qty 22

## 2014-06-23 MED ORDER — SUCCINYLCHOLINE CHLORIDE 20 MG/ML IJ SOLN
INTRAMUSCULAR | Status: AC
  Filled 2014-06-23: qty 1

## 2014-06-23 MED ORDER — RENA-VITE PO TABS
1.0000 | ORAL_TABLET | Freq: Every day | ORAL | Status: DC
  Administered 2014-06-23 – 2014-06-27 (×5): 1 via ORAL
  Filled 2014-06-23 (×7): qty 1

## 2014-06-23 MED ORDER — FENTANYL CITRATE (PF) 250 MCG/5ML IJ SOLN
INTRAMUSCULAR | Status: AC
  Filled 2014-06-23: qty 5

## 2014-06-23 MED ORDER — ROCURONIUM BROMIDE 50 MG/5ML IV SOLN
INTRAVENOUS | Status: AC
  Filled 2014-06-23: qty 1

## 2014-06-23 MED ORDER — PROPOFOL 10 MG/ML IV BOLUS
INTRAVENOUS | Status: AC
  Filled 2014-06-23: qty 20

## 2014-06-23 MED ORDER — PHENYLEPHRINE HCL 10 MG/ML IJ SOLN
INTRAMUSCULAR | Status: DC | PRN
  Administered 2014-06-23: 40 ug via INTRAVENOUS
  Administered 2014-06-23 (×2): 120 ug via INTRAVENOUS
  Administered 2014-06-23: 40 ug via INTRAVENOUS

## 2014-06-23 SURGICAL SUPPLY — 47 items
BLADE SURG 10 STRL SS (BLADE) ×3 IMPLANT
BNDG COHESIVE 4X5 TAN STRL (GAUZE/BANDAGES/DRESSINGS) ×3 IMPLANT
BNDG GAUZE ELAST 4 BULKY (GAUZE/BANDAGES/DRESSINGS) ×4 IMPLANT
BNDG GAUZE STRTCH 6 (GAUZE/BANDAGES/DRESSINGS) ×9 IMPLANT
BRUSH SCRUB DISP (MISCELLANEOUS) ×6 IMPLANT
COVER SURGICAL LIGHT HANDLE (MISCELLANEOUS) ×6 IMPLANT
DRAIN PENROSE 1/4X12 LTX STRL (WOUND CARE) ×2 IMPLANT
DRAPE U-SHAPE 47X51 STRL (DRAPES) ×3 IMPLANT
DRSG ADAPTIC 3X8 NADH LF (GAUZE/BANDAGES/DRESSINGS) ×3 IMPLANT
DRSG MEPITEL 4X7.2 (GAUZE/BANDAGES/DRESSINGS) ×2 IMPLANT
ELECT CAUTERY BLADE 6.4 (BLADE) ×2 IMPLANT
ELECT REM PT RETURN 9FT ADLT (ELECTROSURGICAL)
ELECTRODE REM PT RTRN 9FT ADLT (ELECTROSURGICAL) IMPLANT
GAUZE SPONGE 4X4 12PLY STRL (GAUZE/BANDAGES/DRESSINGS) ×3 IMPLANT
GLOVE BIO SURGEON STRL SZ7.5 (GLOVE) ×3 IMPLANT
GLOVE BIO SURGEON STRL SZ8 (GLOVE) ×3 IMPLANT
GLOVE BIOGEL PI IND STRL 7.5 (GLOVE) ×1 IMPLANT
GLOVE BIOGEL PI IND STRL 8 (GLOVE) ×1 IMPLANT
GLOVE BIOGEL PI INDICATOR 7.5 (GLOVE) ×2
GLOVE BIOGEL PI INDICATOR 8 (GLOVE) ×2
GOWN STRL REUS W/ TWL LRG LVL3 (GOWN DISPOSABLE) ×2 IMPLANT
GOWN STRL REUS W/ TWL XL LVL3 (GOWN DISPOSABLE) ×1 IMPLANT
GOWN STRL REUS W/TWL LRG LVL3 (GOWN DISPOSABLE) ×6
GOWN STRL REUS W/TWL XL LVL3 (GOWN DISPOSABLE) ×3
HANDPIECE INTERPULSE COAX TIP (DISPOSABLE)
KIT BASIN OR (CUSTOM PROCEDURE TRAY) ×3 IMPLANT
KIT ROOM TURNOVER OR (KITS) ×3 IMPLANT
MANIFOLD NEPTUNE II (INSTRUMENTS) ×3 IMPLANT
NS IRRIG 1000ML POUR BTL (IV SOLUTION) ×3 IMPLANT
PACK ORTHO EXTREMITY (CUSTOM PROCEDURE TRAY) ×3 IMPLANT
PAD ARMBOARD 7.5X6 YLW CONV (MISCELLANEOUS) ×6 IMPLANT
PADDING CAST COTTON 6X4 STRL (CAST SUPPLIES) ×3 IMPLANT
SET HNDPC FAN SPRY TIP SCT (DISPOSABLE) IMPLANT
SPONGE GAUZE 4X4 12PLY STER LF (GAUZE/BANDAGES/DRESSINGS) ×2 IMPLANT
SPONGE LAP 18X18 X RAY DECT (DISPOSABLE) ×3 IMPLANT
STOCKINETTE IMPERVIOUS 9X36 MD (GAUZE/BANDAGES/DRESSINGS) ×3 IMPLANT
SUT ETHILON 2 0 PSLX (SUTURE) ×2 IMPLANT
SUT ETHILON 3 0 FSL (SUTURE) ×2 IMPLANT
SUT PDS AB 2-0 CT1 27 (SUTURE) IMPLANT
TOWEL OR 17X24 6PK STRL BLUE (TOWEL DISPOSABLE) ×3 IMPLANT
TOWEL OR 17X26 10 PK STRL BLUE (TOWEL DISPOSABLE) ×6 IMPLANT
TUBE ANAEROBIC SPECIMEN COL (MISCELLANEOUS) IMPLANT
TUBE CONNECTING 12'X1/4 (SUCTIONS) ×1
TUBE CONNECTING 12X1/4 (SUCTIONS) ×2 IMPLANT
UNDERPAD 30X30 INCONTINENT (UNDERPADS AND DIAPERS) ×3 IMPLANT
WATER STERILE IRR 1000ML POUR (IV SOLUTION) ×3 IMPLANT
YANKAUER SUCT BULB TIP NO VENT (SUCTIONS) ×3 IMPLANT

## 2014-06-23 NOTE — Consult Note (Signed)
Indication for Consultation:  Management of ESRD/hemodialysis; anemia, hypertension/volume and secondary hyperparathyroidism  HPI: Chad [name] is a 59 y.o. male who presented to the ED yesterday from SNF for evaluation of laceration sustained on LLE. He recieves HD MWF @ [facility], last HD [employer]. 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 He had previously had hardware removed in Feb d/t infection. He was working with PT yesterday at rehab and while going up stairs his left leg became very weak and he fell hitting his leg on a bar. He sustained a laceration which per notes is 13cm x 8 cm, and lose more than blood. He underwent I&D today. He is currently in HD and tolerating well, will cont to arrange HD while admitted.   Past Medical History  Diagnosis Date  . CKD (chronic kidney disease), stage IV     a. s/p renal transplant 1996.  Marland Kitchen HLD (hyperlipidemia)     takes Pravastatin daily  . Nonischemic cardiomyopathy     a. unknown etiology, EF 30-35% by echo 09/2011;  b. 09/2011 Lexi MV EF 42%, no ischemia/infarct.  . Systolic CHF     takes Lasix daily  . Atrial flutter     takes Amiodarone daily  . Gout     takes Allopurinol daily  . Hypertension     takes Amlodipine,Hydralazine,Imdur,and Coreg daily  . GERD (gastroesophageal reflux disease)     takes Protonix daily  . Shortness of breath dyspnea     exertion  . Pneumonia 2014  . History of bronchitis     several yrs ago  . Joint pain     right shoulder and left knee  . Sciatica   . Hepatitis C 1996    Hep C  . History of colon polyps   . History of kidney stones   . Urinary frequency   . History of shingles   . Hyperparathyroidism due to renal insufficiency   . Sleep apnea     does not use cpap  . Wound of left leg    Past Surgical History  Procedure Laterality Date  . Nephrectomy  transplanted organ Right 63yrs ago  . Insertion of dialysis catheter  06/20/2011    Procedure: INSERTION OF DIALYSIS CATHETER;  Surgeon: [name], MD;  Location: [facility] OR;  Service: Vascular;  Laterality: Right;  Ultrasound guided insertion of right internal jugular dialysis catheter  . Multiple extractions with alveoloplasty  06/27/2011    Procedure: MULTIPLE EXTRACION WITH ALVEOLOPLASTY;  Surgeon: [name], DDS;  Location: [facility] OR;  Service: Oral Surgery;  Laterality: N/A;  Extraction  of tooth # 14 with alveoloplasty  . Hip surgery Left     replacement  . Joint replacement      left hip  . Femur fracture surgery Left 07/09/2012    Dr Deno Etienne  . Femur im nail Left 07/10/2012    ORIF L distal femur;  Surgeon: Toni Arthurs, MD;  Location: [facility] OR;  Service: Orthopedics;  Laterality: Left;  . I&d extremity Left 08/24/2012    Procedure: LEFT LEG IRRIGATION AND DEBRIDEMENT WITH POSSIBLE WOUND VAC APPLICATION;  Surgeon: Toni Arthurs, MD;  Location: [facility] OR;  Service: Orthopedics;  Laterality: Left;  . Tracheostomy  2014  . Tracheostomy closure  2014    2 wks after it was done  . Colonsocopy    . Non  union Left 03/14/2014    LEFT FEMER REPAIR     BY DR HANDY  . Hardware removal Left 03/14/2014    Procedure: HARDWARE REMOVAL LEFT;  Surgeon: Myrene Galas, MD;  Location: [facility] OR;  Service: Orthopedics;  Laterality: Left;  . Wound debridement Left 03/14/2014    Procedure: DEBRIDEMENT WOUND;  Surgeon: Myrene Galas, MD;  Location: [facility] OR;  Service: Orthopedics;  Laterality: Left;  INSERTION OF  ANTIBIOTIC Beads  (2 strands: 15 beads and 14 beads)  . Av fistula placement Left 06/01/2014    Procedure: LEFT BRACHIOCEPHALIC ARTERIOVENOUS (AV) FISTULA CREATION;  Surgeon: Pryor Ochoa, MD;  Location: [facility] OR;  Service: Vascular;  Laterality: Left;  . Incision and drainage Left 06/08/2014    Procedure: INCISION AND DRAINAGE OF LEFT THIGH;  Surgeon: Myrene Galas, MD;  Location: [facility] OR;  Service: Orthopedics;   Laterality: Left;   Family History  Problem Relation Age of Onset  . Heart disease Neg Hx    Social History:  reports that he has never smoked. He has never used smokeless tobacco. He reports that he does not drink alcohol or use illicit drugs. No Known Allergies Prior to Admission medications   Medication Sig Start Date End Date Taking? Authorizing Provider  allopurinol (ZYLOPRIM) 100 MG tablet Take 1 tablet (100 mg total) by mouth 2 (two) times daily. 08/05/12  Yes Daniel J Angiulli, PA-C  amiodarone (PACERONE) 100 MG tablet Take 1 tablet (100 mg total) by mouth daily. 06/05/14  Yes Dolores Patty, MD  aspirin 325 MG tablet Take 325 mg by mouth daily.   Yes Historical Provider, MD  calcitRIOL (ROCALTROL) 0.5 MCG capsule Take 0.5 mcg by mouth daily. 02/02/14  Yes Historical Provider, MD  calcium carbonate, dosed in mg elemental calcium, 1250 (500 CA) MG/5ML Take 15 mLs (1,500 mg of elemental calcium total) by mouth every 6 (six) hours as needed for indigestion. 06/19/14  Yes Montez Morita, PA-C  carvedilol (COREG) 3.125 MG tablet Take 1 tablet (3.125 mg total) by mouth 2 (two) times daily with a meal. 06/16/14  Yes Montez Morita, PA-C  cefTAZidime 2 g in dextrose 5 % 50 mL Inject 2 g into the vein every Monday, Wednesday, and Friday with hemodialysis. 06/16/14  Yes Montez Morita, PA-C  Cholecalciferol (VITAMIN D-3) 5000 UNITS TABS Take 5,000 Units by mouth daily.    Yes Historical Provider, MD  doxercalciferol (HECTOROL) 4 MCG/2ML injection Inject 0.5 mLs (1 mcg total) into the vein every Monday, Wednesday, and Friday with hemodialysis. 06/16/14  Yes Montez Morita, PA-C  isosorbide mononitrate (IMDUR) 60 MG 24 hr tablet TAKE 1 TABLETS BY MOUTH EVERY DAY 06/19/14  Yes Montez Morita, PA-C  oxyCODONE (OXY IR/ROXICODONE) 5 MG immediate release tablet Take 1-2 tablets (5-10 mg total) by mouth every 3 (three) hours as needed for breakthrough pain. 06/16/14  Yes Montez Morita, PA-C  pravastatin (PRAVACHOL) 40 MG tablet TAKE  1 TABLET (40 MG TOTAL) BY MOUTH DAILY.   Yes Laurey Morale, MD  predniSONE (DELTASONE) 5 MG tablet Take 1 tablet (5 mg total) by mouth daily with breakfast. 06/16/14  Yes Montez Morita, PA-C  saccharomyces boulardii (FLORASTOR) 250 MG capsule Take 1 capsule (250 mg total) by mouth 2 (two) times daily. 06/19/14  Yes Montez Morita, PA-C  traMADol (ULTRAM) 50 MG tablet Take 1 tablet (50 mg total) by mouth every 6 (six) hours as needed (pain). As needed for pain 06/16/14  Yes Montez Morita, PA-C  vancomycin (VANCOCIN) 1 GM/200ML SOLN  Inject 200 mLs (1,000 mg total) into the vein every Monday, Wednesday, and Friday with hemodialysis. 06/16/14  Yes Montez Morita, PA-C  multivitamin (RENA-VIT) TABS tablet Take 1 tablet by mouth at bedtime. Patient not taking: Reported on 06/22/2014 06/16/14   Montez Morita, PA-C   Current Facility-Administered Medications  Medication Dose Route Frequency Provider Last Rate Last Dose  . 0.9 %  sodium chloride infusion  100 mL Intravenous PRN Onalee Hua Tat, MD      . 0.9 %  sodium chloride infusion  100 mL Intravenous PRN Catarina Hartshorn, MD      . acetaminophen (TYLENOL) tablet 650 mg  650 mg Oral Q6H PRN Catarina Hartshorn, MD       Or  . acetaminophen (TYLENOL) suppository 650 mg  650 mg Rectal Q6H PRN Catarina Hartshorn, MD      . alteplase (CATHFLO ACTIVASE) injection 2 mg  2 mg Intracatheter Once PRN Catarina Hartshorn, MD      . amiodarone (PACERONE) tablet 100 mg  100 mg Oral Daily Onalee Hua Tat, MD      . calcitRIOL (ROCALTROL) capsule 0.5 mcg  0.5 mcg Oral Daily Onalee Hua Tat, MD      . calcium carbonate (dosed in mg elemental calcium) suspension 1,500 mg of elemental calcium  1,500 mg of elemental calcium Oral Q6H PRN Catarina Hartshorn, MD      . cefTAZidime (FORTAZ) 1 g in dextrose 5 % 50 mL IVPB  1 g Intravenous Once Lauren D Bajbus, RPH      . cefTAZidime (FORTAZ) 2 g in dextrose 5 % 50 mL IVPB  2 g Intravenous Q M,W,F-HD Lauren D Bajbus, RPH   2 g at 06/23/14 0940  . Chlorhexidine Gluconate Cloth 2 % PADS 6 each  6 each  Topical Q0600 Catarina Hartshorn, MD   6 each at 06/23/14 0600  . cholecalciferol (VITAMIN D) tablet 5,000 Units  5,000 Units Oral Daily Catarina Hartshorn, MD      . Melene Muller ON 06/26/2014] Darbepoetin Alfa (ARANESP) injection 100 mcg  100 mcg Intravenous Q Mon-HD Lauren D Bajbus, RPH      . doxercalciferol (HECTOROL) injection 1 mcg  1 mcg Intravenous Q M,W,F-HD Catarina Hartshorn, MD      . feeding supplement (NEPRO CARB STEADY) liquid 237 mL  237 mL Oral PRN Catarina Hartshorn, MD      . heparin injection 5,000 Units  5,000 Units Subcutaneous 3 times per day Catarina Hartshorn, MD   5,000 Units at 06/22/14 2200  . lidocaine (PF) (XYLOCAINE) 1 % injection 5 mL  5 mL Intradermal PRN Catarina Hartshorn, MD      . lidocaine-prilocaine (EMLA) cream 1 application  1 application Topical PRN Catarina Hartshorn, MD      . mupirocin ointment (BACTROBAN) 2 % 1 application  1 application Nasal BID Catarina Hartshorn, MD   1 application at 06/23/14 [id number]  . ondansetron (ZOFRAN) tablet 4 mg  4 mg Oral Q6H PRN Catarina Hartshorn, MD       Or  . ondansetron (ZOFRAN) injection 4 mg  4 mg Intravenous Q6H PRN Catarina Hartshorn, MD      . oxyCODONE (Oxy IR/ROXICODONE) immediate release tablet 5-10 mg  5-10 mg Oral Q3H PRN Catarina Hartshorn, MD      . pentafluoroprop-tetrafluoroeth (GEBAUERS) aerosol 1 application  1 application Topical PRN Catarina Hartshorn, MD      . pravastatin (PRAVACHOL) tablet 40 mg  40 mg Oral q1800 Catarina Hartshorn, MD      . predniSONE (DELTASONE)  tablet 5 mg  5 mg Oral Q breakfast Catarina Hartshorn, MD      . saccharomyces boulardii (FLORASTOR) capsule 250 mg  250 mg Oral BID Catarina Hartshorn, MD   250 mg at 06/22/14 2146  . sodium chloride 0.9 % injection 10-40 mL  10-40 mL Intracatheter Q12H David Tat, MD      . sodium chloride 0.9 % injection 10-40 mL  10-40 mL Intracatheter PRN Onalee Hua Tat, MD      . sodium chloride 0.9 % injection 3 mL  3 mL Intravenous Q12H Catarina Hartshorn, MD   3 mL at 06/22/14 2146   Labs: Basic Metabolic Panel:  Recent Labs Lab 06/17/14 0530 06/19/14 0813 06/22/14 1354 06/23/14 0839  NA  130* 131* 134* 136  K 4.8 4.1 3.9 4.5  CL 94* 94* 97* 97*  CO2 --   GLUCOSE 91 92 92 137*  BUN 7 31* 14 27*  CREATININE 3.14* 5.42* 3.85* 4.60*  CALCIUM 8.9 9.6 9.2  --   PHOS  --  3.3  --   --    Liver Function Tests:  Recent Labs Lab 06/19/14 0813  ALBUMIN 2.2*   No results for input(s): LIPASE, AMYLASE in the last 168 hours. No results for input(s): AMMONIA in the last 168 hours. CBC:  Recent Labs Lab 06/17/14 0530 06/19/14 0812 06/22/14 1354 06/23/14 0839 06/23/14 1410  WBC 15.3* 13.2* 18.3*  --  18.4*  NEUTROABS  --   --  13.5*  --   --   HGB 9.2* 9.1* 9.9* 9.2* 8.2*  HCT 29.1* 28.8* 31.7* 27.0* 26.7*  MCV 97.3 95.4 97.5  --  96.7  PLT 164 172 133*  --  143*   Cardiac Enzymes: No results for input(s): CKTOTAL, CKMB, CKMBINDEX, TROPONINI in the last 168 hours. CBG: No results for input(s): GLUCAP in the last 168 hours. Iron Studies: No results for input(s): IRON, TIBC, TRANSFERRIN, FERRITIN in the last 72 hours. Studies/Results: Dg Tibia/fibula Left  06/22/2014   CLINICAL DATA:  Deep laceration to left lower leg  EXAM: LEFT TIBIA AND FIBULA - 2 VIEW  COMPARISON:  06/08/2014  FINDINGS: Postoperative changes are again seen in the distal left femur stable from the prior exam. Diffuse soft tissue swelling is noted in the calf. A large skin laceration is noted consistent with the patient's given clinical history. No underlying bony abnormality is seen. No erosion to suggest osteomyelitis is seen. An old healing distal fibular fracture is noted.  IMPRESSION: Soft tissue defect consistent with the given clinical history. Postoperative changes in the distal femur are noted. Healing distal fibular fracture is seen as well.   Electronically Signed   By: Alcide Clever M.D.   On: 06/22/2014 14:22    Review of Systems: Reports feeling well/ no complaints Denies fever/chills/ Denies sob/chest pain/palpiations/lightheaded/dizzy Reports good appetite Reports PT had been  going well prior to yesterday  Physical Exam: Filed Vitals:   06/23/14 1200 06/23/14 1214 06/23/14 1215 06/23/14 1255  BP: 107/66  104/68 107/61  Pulse: 76   78  Temp:   97.7 F (36.5 C) 97.3 F (36.3 C)  TempSrc:    Oral  Resp: Height:      Weight:      SpO2: 100% 100% 100% 100%     General: Well developed, well nourished, in no acute distress. Head: Normocephalic, atraumatic, sclera non-icteric, mucus membranes are moist Neck: Supple. JVD not elevated. Lungs: Clear bilaterally  to auscultation without wheezes, rales, or rhonchi. Breathing is unlabored. Heart: RRR with S1 S2. No murmurs, rubs, or gallops appreciated. Abdomen: Soft, non-tender, non-distended with normoactive bowel sounds. No rebound/guarding. No obvious abdominal masses. M-S:  Strength and tone appear normal for age. Lower extremities:without edema or ischemic changes, no open wounds  Neuro: Alert and oriented X 3. Moves all extremities spontaneously. Psych:  Responds to questions appropriately with a normal affect. Dialysis Access: L AVF +b/t maturing.   L IJ cath  Dialysis Orders: [facility] 4hr 30 mins     103.5kgs        2K/2.5Ca      400/800   3000u heparin hectorol 1. No ESA or FE   Assessment/Plan: 1.  LLE laceration s/p fall- I&D 5/27 per ortho 2. L distal femur osteo- s/p I&D w beads 5/12. outpt culture + for MRSA, inpt + Proteus, remained on vanc and fortaz at outpt center at [facility]- cont antibiotics 3.  ESRD -  MWF [facility], HD today, uf goal 2000 4.  Hypertension/volume  - 104/70, coreg on hold, uf goal 2000. No volume excess 5.  Anemia  - last hgb 10.2- down to 8.2  and tsat 22. hgb had been running in the 12, acute blood loss- start  Aranesp. No heparin in HD 6.  Metabolic bone disease - correct ca 10.5, last phos 2.8, PTH 474, hold hectorol. Hold phoslo. 2 Ca bath  7.  Nutrition - alb 2. Renal diet, viamin and nepro  Jetty Duhamel, NP [employer]  [phone] 06/23/2014, 2:44 PM

## 2014-06-23 NOTE — Progress Notes (Signed)
PROGRESS NOTE  HENCE KOSTEN CXK:[id number] DOB: 1955-09-27 DOA: 06/22/2014 PCP: [name], MD  Brief History 59 year old male with a history of nonischemic cardiomyopathy, paroxysmal atrial flutter not on anticoagulation secondary to GI bleed, ESRD (MWF) with failed renal transplant (1996), hepatitis C, and osteomyelitis of the left femur. The patient initially had a distal femur fracture in June 2014 and underwent ORIF at that time. He subsequently developed a wound infection and underwent irrigation and debridement. He had been following infectious disease. On 03/14/2014, the patient had his hardware removed and had placement of antibiotic beads. He was seen in the orthopedic office on 06/07/2014. He was noted to have purulent drainage. He was admitted on 06/08/2014 for repeat irrigation and debridement. Unfortunately, the patient developed hypotension post-op that was refractory to 3 doses of colloid infusion. Critical care was consulted, and the patient was transferred to the ICU where he was placed on vasopressors. It was thought that the patient was possibly septic secondary to his wound infection and osteomyelitis as well as residual effects from his anesthesia. He was initially placed on stress steroids which were weaned to his baseline dose. He was weaned off his vasopressors and transferred out of ICU,  During that admission, internal medicine consultation was obtained for medical management. The patient's anti-hypertensive regimen was adjusted. More specifically his amlodipine and hydralazine were discontinued. The patient was discharged on 06/19/2014 with instructions to finish 6 weeks of ceftazidime and vancomycin. Wound cultures obtained intraoperatively grew Proteus mirabilis. The patient was discharged to go to [facility]. Unfortunately, the patient had a mechanical fall during therapy. The patient hit his left leg on to some type of railing causing complicated  laceration. He is brought to the emergency department. His orthopedic surgeon, Dr. Carola Frost was consulted and plans to close the patient's wound in the operating room. [facility] was requested to admit patient due to his multiple medical issues.  Assessment/Plan: Hypotension -Multifactorial including possible sepsis, opioids volume depletion, cardiomyopathy, and possible adrenal insufficiency in the setting of 3 antihypertensive medications -Overall improved after IV fluid and stress steroid-induced -At baseline, the patient's systolic blood pressure usually is in the mid 90s to low 100s -Obtain blood cultures 2 sets, check lactic acid -Discontinue Imdur and carvedilol for now. Continue amiodarone if his blood pressure is able to tolerate. -His previous amlodipine and hydralazine were discontinued during his last hospital admission Laceration left lower extremity -06/23/2014--surgically repaired -wound care per Dr. Carola Frost Osteomyelitis left femur -Status post I&D 06/08/2014, Dr. Carola Frost -Wound care per orthopedics -Continue ceftazidime and vancomycin on dialysis--start date 06/12/2014 -patient was previously seen by infectious disease on his last admission whom recommended 6 weeks of intravenous antibiotics-Blood cultures remain negative -Patient is afebrile with soft blood pressures remained soft -Physical therapy evaluation when orthopedics feels the patient is stable Chronic systolic CHF/nonischemic cardiomyopathy -Previously had EF as low as 15-20% -Follows Dr. [name] -Most recent echocardiogram 06/13/2013 showed EF 60% - resume lower dose carvedilol and resume Imdur once blood pressure allows  -has had Unna boots on his bilateral lower extremities for worsening leg edema for the past month which was attributable to the patient's worsening renal function  -He is not in any respiratory distress--his mild hypoxemia likely due to atelectasis --oxygen saturation 96-99 percent on RA -Encourage  incentive spirometry Thrombocytopenia -This has been chronic dating back to November 2013 -likely due to chronic hep C with 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 -largely stable  -Intermittent worsening likely due  to the patient's infectious process with associated myelosuppression -Monitor for bleeding complications -B12 on 06/11/14 was low normal at 251--add B12 supplement po -HIT panel if continued drop 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 -Dialysis per nephrology -Patient has only resumed hemodialysis within the past month  -AV fistula creation 06/01/2014  -Metabolic bone disease per nephrology -Dialysis Monday, Wednesday, Friday -Continue chronic home dose of prednisone 5 mg daily after surgery Paroxysmal atrial flutter  -Presently in sinus rhythm  -Not on anticoagulation secondary to GI bleed  -restart aspirin in am if ok with ortho -resume carvedilol when BP allows -Continue amiodarone  Anemia of CKD -Patient's baseline hemoglobin has had wide variations but baseline seems to be around 11 -Drop in hemoglobin some blood loss -Aranesp per nephrology -Repeat CBC in a.m.--transfuse for hemoglobin less than 7  Hypertension -resume carvedilol and imdur when able Hyperlipidemia -Continue pravastatin      Procedures/Studies: Dg Tibia/fibula Left  06/22/2014   CLINICAL DATA:  Deep laceration to left lower leg  EXAM: LEFT TIBIA AND FIBULA - 2 VIEW  COMPARISON:  06/08/2014  FINDINGS: Postoperative changes are again seen in the distal left femur stable from the prior exam. Diffuse soft tissue swelling is noted in the calf. A large skin laceration is noted consistent with the patient's given clinical history. No underlying bony abnormality is seen. No erosion to suggest osteomyelitis is seen. An old healing distal fibular fracture is noted.  IMPRESSION: Soft tissue defect consistent with the given clinical history. Postoperative changes in the distal femur are noted. Healing distal  fibular fracture is seen as well.   Electronically Signed   By: Alcide Clever M.D.   On: 06/22/2014 14:22   Dg Chest Port 1 View  06/09/2014   CLINICAL DATA:  Central line placement.  EXAM: PORTABLE CHEST - 1 VIEW  COMPARISON:  06/08/2014  FINDINGS: Shallow inspiration with atelectasis in the lung bases. Cardiac enlargement. Pulmonary vascularity is normal. Left central venous catheter remains in place without change. Interval placement of a right central venous catheter with tip over the cavoatrial junction region. No pneumothorax. Calcified and tortuous aorta. Degenerative changes in the right shoulder.  IMPRESSION: Appliances appear to be in satisfactory location. Shallow inspiration with atelectasis in the lung bases.   Electronically Signed   By: Burman Nieves M.D.   On: 06/09/2014 00:52   Dg Chest Portable 1 View  06/08/2014   CLINICAL DATA:  Preop leg surgery  EXAM: PORTABLE CHEST - 1 VIEW  COMPARISON:  03/20/2014  FINDINGS: Interval placement of dialysis catheter. Left jugular catheter tip in the lower SVC/RA junction. Tip not well visualized.  Hypoventilation with decreased lung volume and bibasilar atelectasis. Negative for edema or effusion.  IMPRESSION: Hypoventilation with bibasilar atelectasis.   Electronically Signed   By: Marlan Palau M.D.   On: 06/08/2014 11:00   Dg Knee Left Port  06/08/2014   CLINICAL DATA:  Postoperative images of the left knee after incision and drainage with antibiotic beads  EXAM: PORTABLE LEFT KNEE - 1-2 VIEW  COMPARISON:  03/20/2014  FINDINGS: Numerous antibiotic beads project over fracture distal femoral metaphysis. The fracture remains mildly displaced with improved positioning and alignment in the anterior posterior orientation. There is soft tissue heterogeneity over the knee joint with catheter within the inferior aspect of the joint consistent with postoperative change. Air in the soft tissues laterally adjacent to and superior to the knee joint may  reflect changes of infection as well.  There are lucencies  over the distal femoral shaft measuring an area of about 7 cm in length. This appears to represent bone that has been evacuated of previous antibiotic beads.  IMPRESSION: Postoperative change with fracture distal femur.  Areas of lucency in distal femoral shaft where antibiotic beads previously were present. Beats are now concentrated more inferiorly in the region of the metaphysis with a few in the lateral soft tissues.   Electronically Signed   By: Esperanza Heir M.D.   On: 06/08/2014 16:54   Dg Abd Portable 1v  06/16/2014   CLINICAL DATA:  Abdominal pain at dialysis today.  EXAM: PORTABLE ABDOMEN - 1 VIEW  COMPARISON:  07/24/2012  FINDINGS: The bowel gas pattern is normal. No radio-opaque calculi or other significant radiographic abnormality are seen in the abdomen.  The heart is mildly enlarged. Patient has dialysis catheter projecting over the right atrium. There is patchy density at the lung bases consistent with atelectasis or early infiltrates.  IMPRESSION: 1. Nonobstructive bowel gas pattern. 2. Atelectasis or early infiltrates in the lung bases.   Electronically Signed   By: Norva Pavlov M.D.   On: 06/16/2014 18:17   Dg C-arm 61-120 Min  06/08/2014   CLINICAL DATA:  Incision and drainage with antibiotic bead removal from left thigh. Chronic osteomyelitis.  EXAM: DG C-ARM 61-120 MIN; LEFT FEMUR 2 VIEWS  COMPARISON:  Radiographs 03/14/2014 and 03/20/2014.  FLUOROSCOPY TIME:  C-arm fluoroscopic images were obtained intraoperatively and submitted for post operative interpretation. Please see the performing provider's procedural report for the fluoroscopy time utilized.  FINDINGS: Four spot fluoroscopic images of the distal left femur demonstrate interval removal of the antibiotic impregnated beads. The comminuted fracture of the distal femur appears grossly stable with impaction and posterior displacement. There is gas within the surgical  bed.  IMPRESSION: Intraoperative views during antibiotic impregnated bead removal.   Electronically Signed   By: Carey Bullocks M.D.   On: 06/08/2014 14:30   Dg Femur Min 2 Views Left  06/08/2014   CLINICAL DATA:  Incision and drainage with antibiotic bead removal from left thigh. Chronic osteomyelitis.  EXAM: DG C-ARM 61-120 MIN; LEFT FEMUR 2 VIEWS  COMPARISON:  Radiographs 03/14/2014 and 03/20/2014.  FLUOROSCOPY TIME:  C-arm fluoroscopic images were obtained intraoperatively and submitted for post operative interpretation. Please see the performing provider's procedural report for the fluoroscopy time utilized.  FINDINGS: Four spot fluoroscopic images of the distal left femur demonstrate interval removal of the antibiotic impregnated beads. The comminuted fracture of the distal femur appears grossly stable with impaction and posterior displacement. There is gas within the surgical bed.  IMPRESSION: Intraoperative views during antibiotic impregnated bead removal.   Electronically Signed   By: Carey Bullocks M.D.   On: 06/08/2014 14:30         Subjective: Pain is controlled. Denies any fevers, chills, chest discomfort, shortness breath, nausea, vomiting, diarrhea, abdominal pain.  Objective: Filed Vitals:   06/23/14 1200 06/23/14 1214 06/23/14 1215 06/23/14 1255  BP: 107/66  104/68 107/61  Pulse: 76   78  Temp:   97.7 F (36.5 C) 97.3 F (36.3 C)  TempSrc:    Oral  Resp: Height:      Weight:      SpO2: 100% 100% 100% 100%    Intake/Output Summary (Last 24 hours) at 06/23/14 1330 Last data filed at 06/23/14 1045  Gross per 24 hour  Intake    300 ml  Output    100 ml  Net    200 ml   Weight change:  Exam:   General:  Pt is alert, follows commands appropriately, not in acute distress  HEENT: No icterus, No thrush,  [place]/AT  Cardiovascular: RRR, S1/S2, no rubs, no gallops  Respiratory: Bibasilar crackles, right greater than left. No wheezes  Abdomen: Soft/+BS,  non tender, non distended, no guarding  Extremities: 2+LE edema, No lymphangitis, No petechiae, No rashes, no synovitis; UNNA boots bilat  Data Reviewed: Basic Metabolic Panel:  Recent Labs Lab 06/17/14 0530 06/19/14 0813 06/22/14 1354 06/23/14 0839  NA 130* 131* 134* 136  K 4.8 4.1 3.9 4.5  CL 94* 94* 97* 97*  CO2 27 25 25   --   GLUCOSE 91 92 92 137*  BUN 7 31* 14 27*  CREATININE 3.14* 5.42* 3.85* 4.60*  CALCIUM 8.9 9.6 9.2  --   PHOS  --  3.3  --   --    Liver Function Tests:  Recent Labs Lab 06/19/14 0813  ALBUMIN 2.2*   No results for input(s): LIPASE, AMYLASE in the last 168 hours. No results for input(s): AMMONIA in the last 168 hours. CBC:  Recent Labs Lab 06/17/14 0530 06/19/14 0812 06/22/14 1354 06/23/14 0839  WBC 15.3* 13.2* 18.3*  --   NEUTROABS  --   --  13.5*  --   HGB 9.2* 9.1* 9.9* 9.2*  HCT 29.1* 28.8* 31.7* 27.0*  MCV 97.3 95.4 97.5  --   PLT 164 172 133*  --    Cardiac Enzymes: No results for input(s): CKTOTAL, CKMB, CKMBINDEX, TROPONINI in the last 168 hours. BNP: Invalid input(s): POCBNP CBG: No results for input(s): GLUCAP in the last 168 hours.  No results found for this or any previous visit (from the past 240 hour(s)).   Scheduled Meds: . amiodarone  100 mg Oral Daily  . calcitRIOL  0.5 mcg Oral Daily  . ceftAZIDime (FORTAZ) IVPB 2 gram/50 mL D5W (Pyxis)  2 g Intravenous Q M,W,F-HD  . Chlorhexidine Gluconate Cloth  6 each Topical Q0600  . cholecalciferol  5,000 Units Oral Daily  . [START ON 06/26/2014] darbepoetin (ARANESP) injection - DIALYSIS  100 mcg Intravenous Q Mon-HD  . doxercalciferol  1 mcg Intravenous Q M,W,F-HD  . heparin  5,000 Units Subcutaneous 3 times per day  . mupirocin ointment  1 application Nasal BID  . pravastatin  40 mg Oral q1800  . predniSONE  5 mg Oral Q breakfast  . saccharomyces boulardii  250 mg Oral BID  . sodium chloride  3 mL Intravenous Q12H  . vancomycin  1,000 mg Intravenous Q M,W,F-HD    Continuous Infusions:    Emer Onnen, DO  [name] Pager [phone]  If 7PM-7AM, please contact night-coverage [email or web address] Password [email or web address] 06/23/2014, 1:30 PM   LOS: 1 day

## 2014-06-23 NOTE — Procedures (Signed)
I was present at this dialysis session. I have reviewed the session itself and made appropriate changes.   Ryan Sanford  MD 06/23/2014, 2:56 PM    

## 2014-06-23 NOTE — Anesthesia Procedure Notes (Addendum)
Procedure Name: LMA Insertion Date/Time: 06/23/2014 9:06 AM Performed by: Reine Just Pre-anesthesia Checklist: Patient identified, Emergency Drugs available, Suction available, Patient being monitored and Timeout performed Patient Re-evaluated:Patient Re-evaluated prior to inductionOxygen Delivery Method: Circle system utilized and Simple face mask Preoxygenation: Pre-oxygenation with 100% oxygen Intubation Type: IV induction Ventilation: Mask ventilation without difficulty LMA: LMA inserted LMA Size: 5.0 Number of attempts: 1 Airway Equipment and Method: Patient positioned with wedge pillow Placement Confirmation: positive ETCO2 and breath sounds checked- equal and bilateral Tube secured with: Tape Dental Injury: Teeth and Oropharynx as per pre-operative assessment

## 2014-06-23 NOTE — Anesthesia Preprocedure Evaluation (Addendum)
Anesthesia Evaluation  Patient identified by MRN, date of birth, ID band Patient awake    Reviewed: Allergy & Precautions, NPO status , Patient's Chart, lab work & pertinent test results, reviewed documented beta blocker date and time   Airway Mallampati: II  TM Distance: >3 FB Neck ROM: Full    Dental  (+) Teeth Intact, Dental Advisory Given   Pulmonary shortness of breath, sleep apnea ,  breath sounds clear to auscultation        Cardiovascular hypertension, Pt. on medications and Pt. on home beta blockers +CHF + Valvular Problems/Murmurs AS and AI Rhythm:Regular Rate:Normal  05/2013 TTE: EF 60%. Mild AS. Mild/mod AI   Neuro/Psych negative neurological ROS     GI/Hepatic GERD-  Controlled,(+) Hepatitis -, C  Endo/Other  Morbid obesity  Renal/GU CRFRenal disease     Musculoskeletal   Abdominal   Peds  Hematology  (+) anemia ,   Anesthesia Other Findings   Reproductive/Obstetrics                          Lab Results  Component Value Date   CREATININE 3.85* 06/22/2014   BUN 14 06/22/2014   NA 134* 06/22/2014   K 3.9 06/22/2014   CL 97* 06/22/2014   CO2 25 06/22/2014   Lab Results  Component Value Date   WBC 18.3* 06/22/2014   HGB 9.9* 06/22/2014   HCT 31.7* 06/22/2014   MCV 97.5 06/22/2014   PLT 133* 06/22/2014    Anesthesia Physical Anesthesia Plan  ASA: III  Anesthesia Plan: General   Post-op Pain Management:    Induction: Intravenous  Airway Management Planned: Oral ETT and LMA  Additional Equipment:   Intra-op Plan:   Post-operative Plan: Extubation in OR  Informed Consent: I have reviewed the patients History and Physical, chart, labs and discussed the procedure including the risks, benefits and alternatives for the proposed anesthesia with the patient or authorized representative who has indicated his/her understanding and acceptance.   Dental advisory  given  Plan Discussed with: CRNA and Anesthesiologist  Anesthesia Plan Comments:        Anesthesia Quick Evaluation

## 2014-06-23 NOTE — Transfer of Care (Signed)
Immediate Anesthesia Transfer of Care Note  Patient: Chad [name]  Procedure(s) Performed: Procedure(s): IRRIGATION AND DEBRIDEMENT EXTREMITY (Left)  Patient Location: PACU  Anesthesia Type:General  Level of Consciousness: awake, alert  and oriented  Airway & Oxygen Therapy: Patient Spontanous Breathing and Patient connected to nasal cannula oxygen  Post-op Assessment: Report given to RN, Post -op Vital signs reviewed and stable and Patient moving all extremities X 4  Post vital signs: Reviewed and stable  Last Vitals:  Filed Vitals:   06/23/14 1054  BP: 123/71  Pulse: 79  Temp: 36.7 C  Resp: 15    Complications: No apparent anesthesia complications

## 2014-06-23 NOTE — Brief Op Note (Addendum)
06/22/2014 - 06/23/2014  2:46 PM  PATIENT:  Chad [name]  59 y.o. male  PRE-OPERATIVE DIAGNOSIS:   1. OPEN WOUND LEFT LEG, 13CM X 8CM 2. PLANTAR HEEL ULCER  POST-OPERATIVE DIAGNOSIS:  1. OPEN WOUND LEFT LEG, 13CM X 8CM 2. PLANTAR HEEL ULCER  PROCEDURE:  Procedure(s): 1. IRRIGATION AND DEBRIDEMENT EXTREMITY (Left) SKIN, SUBCUTANEOUS TISSUE, EVACUATION HEMATOMA 2. COMPLEX CLOSURE DEGLOVING WOUND 3. PARING EXCISIONAL DEBRIDEMENT DERMIS PLANTAR ULCER   SURGEON:  Surgeon(s) and Role:    * Myrene Galas, MD - Primary  PHYSICIAN ASSISTANT: Montez Morita, PA-C  ANESTHESIA:   general  I/O:  Total I/O In: 300 [I.V.:300] Out: 100 [Blood:100]  SPECIMEN:  No Specimen  Drains: TWO 1/4" penroses  TOURNIQUET:  * No tourniquets in log *  DICTATION: .Other Dictation: Dictation Number [id number]

## 2014-06-23 NOTE — Progress Notes (Addendum)
ANTIBIOTIC CONSULT NOTE - INITIAL  Pharmacy Consult for vancomycin and ceftazidime Indication: osteomyelitis and wound infection  No Known Allergies  Patient Measurements: Height: 6' (182.9 cm) Weight: 229 lb (103.874 kg) IBW/kg (Calculated) : 77.6   Vital Signs: Temp: 97.3 F (36.3 C) (05/27 1255) Temp Source: Oral (05/27 1255) BP: 107/61 mmHg (05/27 1255) Pulse Rate: 78 (05/27 1255) Intake/Output from previous day:   Intake/Output from this shift: Total I/O In: 300 [I.V.:300] Out: 100 [Blood:100]  Labs:  Recent Labs  06/22/14 1354 06/23/14 0839  WBC 18.3*  --   HGB 9.9* 9.2*  PLT 133*  --   CREATININE 3.85* 4.60*   Estimated Creatinine Clearance: 22.1 mL/min (by C-G formula based on Cr of 4.6). No results for input(s): VANCOTROUGH, VANCOPEAK, VANCORANDOM, GENTTROUGH, GENTPEAK, GENTRANDOM, TOBRATROUGH, TOBRAPEAK, TOBRARND, AMIKACINPEAK, AMIKACINTROU, AMIKACIN in the last 72 hours.   Microbiology: Recent Results (from the past 720 hour(s))  Anaerobic culture     Status: None   Collection Time: 06/08/14 11:55 AM  Result Value Ref Range Status   Specimen Description TISSUE THIGH LEFT  Final   Special Requests NONE  Final   Gram Stain   Final    ABUNDANT WBC PRESENT,BOTH PMN AND MONONUCLEAR ABUNDANT GRAM POSITIVE COCCI IN CLUSTERS IN PAIRS Performed at [facility] Gram Stain Report Called to,Read Back By and Verified With: Gram Stain Report Called to,Read Back By and Verified With: Mendel Corning RN 1320 06/08/14 [place] WILSONM Performed at [facility]    Culture   Final    NO ANAEROBES ISOLATED Performed at [facility]    Report Status 06/13/2014 FINAL  Final  Gram stain     Status: None   Collection Time: 06/08/14 11:55 AM  Result Value Ref Range Status   Specimen Description TISSUE THIGH LEFT  Final   Special Requests NONE  Final   Gram Stain   Final    ABUNDANT WBC PRESENT,BOTH PMN AND MONONUCLEAR ABUNDANT GRAM POSITIVE COCCI IN PAIRS  IN CLUSTERS Gram Stain Report Called to,Read Back By and Verified With: Mendel Corning RN 13:20 06/08/14 ([email or web address])    Report Status 06/08/2014 FINAL  Final  Tissue culture     Status: None   Collection Time: 06/08/14 11:55 AM  Result Value Ref Range Status   Specimen Description TISSUE THIGH LEFT  Final   Special Requests NONE  Final   Gram Stain   Final    ABUNDANT WBC PRESENT,BOTH PMN AND MONONUCLEAR ABUNDANT GRAM POSITIVE COCCI IN CLUSTERS IN PAIRS Performed at [facility] Gram Stain Report Called to,Read Back By and Verified With: Gram Stain Report Called to,Read Back By and Verified With: Mendel Corning RN 1320 06/08/14 [place]ILSON[place] Performed at [facility]    Culture   Final    MODERATE PROTEUS MIRABILIS Performed at [facility]    Report Status 06/11/2014 FINAL  Final   Organism ID, Bacteria PROTEUS MIRABILIS  Final      Susceptibility   Proteus mirabilis - MIC*    AMPICILLIN >=32 RESISTANT Resistant     AMPICILLIN/SULBACTAM >=32 RESISTANT Resistant     CEFAZOLIN >=64 RESISTANT Resistant     CEFEPIME <=1 SENSITIVE Sensitive     CEFTAZIDIME 4 SENSITIVE Sensitive     CEFTRIAXONE 4 SENSITIVE Sensitive     CIPROFLOXACIN <=0.25 SENSITIVE Sensitive     GENTAMICIN <=1 SENSITIVE Sensitive     IMIPENEM 2 SENSITIVE Sensitive     PIP/TAZO <=4 SENSITIVE Sensitive  TOBRAMYCIN <=1 SENSITIVE Sensitive     TRIMETH/SULFA <=20 SENSITIVE Sensitive     * MODERATE PROTEUS MIRABILIS  Anaerobic culture     Status: None   Collection Time: 06/08/14  1:58 PM  Result Value Ref Range Status   Specimen Description TISSUE  Final   Special Requests REAMINGS OF LEFT FEMUR PT ON ANCEF AND ZOSYN  Final   Gram Stain   Final    ABUNDANT WBC PRESENT,BOTH PMN AND MONONUCLEAR NO ORGANISMS SEEN Performed at [facility] Performed at [facility]    Culture   Final    NO ANAEROBES ISOLATED Performed at [facility]    Report Status 06/13/2014 FINAL  Final   Gram stain     Status: None   Collection Time: 06/08/14  1:58 PM  Result Value Ref Range Status   Specimen Description TISSUE  Final   Special Requests REAMINGS OF LEFT FEMUR  Final   Gram Stain   Final    ABUNDANT WBC PRESENT,BOTH PMN AND MONONUCLEAR NO ORGANISMS SEEN    Report Status 06/08/2014 FINAL  Final  Tissue culture     Status: None   Collection Time: 06/08/14  1:58 PM  Result Value Ref Range Status   Specimen Description TISSUE  Final   Special Requests REAMINGS OF LEFT FEMUR  Final   Gram Stain   Final    ABUNDANT WBC PRESENT,BOTH PMN AND MONONUCLEAR NO ORGANISMS SEEN Performed at [facility] Performed at [facility]    Culture   Final    NO GROWTH 3 DAYS Performed at [facility]    Report Status 06/11/2014 FINAL  Final  MRSA PCR Screening     Status: Abnormal   Collection Time: 06/08/14  6:15 PM  Result Value Ref Range Status   MRSA by PCR POSITIVE (A) NEGATIVE Final    Comment:        The GeneXpert MRSA Assay (FDA approved for NASAL specimens only), is one component of a comprehensive MRSA colonization surveillance program. It is not intended to diagnose MRSA infection nor to guide or monitor treatment for MRSA infections. RESULT CALLED TO, READ BACK BY AND VERIFIED WITH: RN,GREG HARDUK [id number]  THANEY   Culture, blood (routine x 2)     Status: None   Collection Time: 06/08/14  7:08 PM  Result Value Ref Range Status   Specimen Description BLOOD RIGHT ANTECUBITAL  Final   Special Requests   Final    BOTTLES DRAWN AEROBIC AND ANAEROBIC 5CCBLUE 3CC RED   Culture   Final    NO GROWTH 5 DAYS Note: Culture results may be compromised due to an inadequate volume of blood received in culture bottles. Performed at [facility]    Report Status 06/15/2014 FINAL  Final  Culture, blood (routine x 2)     Status: None   Collection Time: 06/08/14  7:20 PM  Result Value Ref Range Status   Specimen Description BLOOD RIGHT  HAND  Final   Special Requests BOTTLES DRAWN AEROBIC ONLY  Final   Culture   Final    NO GROWTH 5 DAYS Performed at [facility]    Report Status 06/15/2014 FINAL  Final  Urine culture     Status: None   Collection Time: 06/09/14  7:03 AM  Result Value Ref Range Status   Specimen Description URINE, RANDOM  Final   Special Requests NONE  Final   Colony Count NO GROWTH Performed at  [facility]   Final   Culture NO GROWTH Performed at [facility]   Final   Report Status 06/10/2014 FINAL  Final    Assessment: 7 YOM with ESRD on HD MWF who was recently admitted 5/12-5/23 for osteomyelitis of L femur. He fell and now has a left lower leg laceration and went to OR today for I&D.  He was discharged on vancomycin and ceftazidime with HD- note that patient had an elevated level of 35 over the weekend and adjustment was not made- patient continued on 1g IV qHD-MWF. Pharmacy called patient's outpatient center and asked them to hold vancomycin dose scheduled for 5/23- unsure if this was actually completed. Patient received ceftazidime 2g IV this morning at 0940 pre-op  Zosyn 5/12 x 1 dose  Merrem 5/12>> 5/15 CTX 5/15>>5/18 Vanc 5/12>> 5/14; 5/18>> Ceftazidime 5/18>>   5/11 wound cx (picture in 5/23 d/c summary): p.mirabilis, MRSA 5/26 BCX: sent  Goal of Therapy:  pre-HD vancomycin level 15-67mcg/mL  Plan:  -pre-HD vancomycin level tonight and orders will be adjusted as needed (may need to hold or lower tonight's dose based on value) -give ceftazidime 1g IV x1 tonight- scheduled for 2200- needs to be given AFTER HD is completed -resume ceftazidime 2g IV qMWF after HD on 5/30 -follow c/s of this admission, clinical progression, HD schedule  Lauren D. Bajbus, PharmD, BCPS Clinical Pharmacist Pager: [phone] 06/23/2014 1:39 PM  Addendum  Random vanc level came back at 15. Pt is getting HD today so will put in order for vanc to be resumed  today.  Plan  Vanc 1g IV qMWF Will adjust vanc if off schedule  Ulyses Southward, PharmD Pager: [phone] 06/23/2014 5:06 PM

## 2014-06-23 NOTE — Progress Notes (Signed)
IV team to check central line.  No x-ray for RIJ to check for placement. Paged Dr.Tat for order for x-ray to check for placement.

## 2014-06-23 NOTE — Anesthesia Postprocedure Evaluation (Signed)
  Anesthesia Post-op Note  Patient: Chad [name]  Procedure(s) Performed: Procedure(s): IRRIGATION AND DEBRIDEMENT EXTREMITY (Left)  Patient Location: PACU  Anesthesia Type:General  Level of Consciousness: awake, alert  and oriented  Airway and Oxygen Therapy: Patient Spontanous Breathing  Post-op Pain: mild  Post-op Assessment: Post-op Vital signs reviewed  Post-op Vital Signs: Reviewed  Last Vitals:  Filed Vitals:   06/23/14 1215  BP: 104/68  Pulse:   Temp: 36.5 C  Resp: 14    Complications: No apparent anesthesia complications

## 2014-06-24 ENCOUNTER — Encounter (HOSPITAL_COMMUNITY): Payer: Self-pay | Admitting: Orthopedic Surgery

## 2014-06-24 LAB — CBC
HCT: 26 % — ABNORMAL LOW (ref 39.0–52.0)
Hemoglobin: 8 g/dL — ABNORMAL LOW (ref 13.0–17.0)
MCH: 30.7 pg (ref 26.0–34.0)
MCHC: 30.8 g/dL (ref 30.0–36.0)
MCV: 99.6 fL (ref 78.0–100.0)
Platelets: 97 10*3/uL — ABNORMAL LOW (ref 150–400)
RBC: 2.61 MIL/uL — ABNORMAL LOW (ref 4.22–5.81)
RDW: 20 % — AB (ref 11.5–15.5)
WBC: 15.2 10*3/uL — ABNORMAL HIGH (ref 4.0–10.5)

## 2014-06-24 NOTE — Clinical Social Work Note (Signed)
Clinical Social Work Assessment  Patient Details  Name: Chad [name] MRN: [id number] Date of Birth: 1955/05/07  Date of referral:  06/24/14               Reason for consult:   [facility] )                Permission sought to share information with:  Family Supports Permission granted to share information::  Yes, Verbal Permission Granted  Name::     Chad [name], son    Housing/Transportation Living arrangements for the past 2 months:  Skilled Nursing Facility Source of Information:  Patient, Adult Children Chad [name] ) Patient Interpreter Needed:  None Criminal Activity/Legal Involvement Pertinent to Current Situation/Hospitalization:  No - Comment as needed Significant Relationships:  Adult Children, Significant Other Lives with:  Facility Resident Do you feel safe going back to the place where you live?  Yes Need for family participation in patient care:  No (Coment)  Care giving concerns:  N/A   Office manager / plan:  CSW spoke with the pt and the pt's son Chad Beam. CSW introduced self and purpose of the visit. Both the pt and Chad Beam reported the pt lives at home, but currently received rehab at [facility].  Both the pt and son agreed to return back to [facility]. CSW answered all questions in which the [place] inquired about. CSW will continue to follow this pt and assist with discharge as needed.   Employment status:  Retired Primary school teacher )  Patient/Family's Response to care:  The pt reported that care in which he is receiving is great.   Patient/Family's Understanding of and Emotional Response to Diagnosis, Current Treatment, and Prognosis: Pt reported not understanding why his body is not recovering faster. Pt acknowledged having underlining health problems, but he did not think it contributed to his current condition.    Emotional Assessment Appearance:  Appears stated  age Attitude/Demeanor/Rapport:   (Appropriate) Affect (typically observed):  Appropriate Orientation:  Oriented to Self, Oriented to Place, Oriented to  Time, Oriented to Situation Alcohol / Substance use:  Not Applicable Psych involvement (Current and /or in the community):  No (Comment)  Discharge Needs  Concerns to be addressed:  Denies Needs/Concerns at this time Readmission within the last 30 days:  Yes Current discharge risk:  None Barriers to Discharge:  No Barriers Identified   Chad Doubek, LCSW 06/24/2014, 10:36 AM

## 2014-06-24 NOTE — Progress Notes (Signed)
PROGRESS NOTE  Chad [name] ZOX:[id number] DOB: 23-Dec-1955 DOA: 06/22/2014 PCP: [name], MD  Brief History 59 year old male with a history of nonischemic cardiomyopathy, paroxysmal atrial flutter not on anticoagulation secondary to GI bleed, ESRD (MWF) with failed renal transplant (1996), hepatitis C, and osteomyelitis of the left femur. The patient initially had a distal femur fracture in June 2014 and underwent ORIF at that time. He subsequently developed a wound infection and underwent irrigation and debridement. He had been following infectious disease. On 03/14/2014, the patient had his hardware removed and had placement of antibiotic beads. He was seen in the orthopedic office on 06/07/2014. He was noted to have purulent drainage. He was admitted on 06/08/2014 for repeat irrigation and debridement. Unfortunately, the patient developed hypotension post-op that was refractory to 3 doses of colloid infusion. Critical care was consulted, and the patient was transferred to the ICU where he was placed on vasopressors. It was thought that the patient was possibly septic secondary to his wound infection and osteomyelitis as well as residual effects from his anesthesia. He was initially placed on stress steroids which were weaned to his baseline dose. He was weaned off his vasopressors and transferred out of ICU, During that admission, internal medicine consultation was obtained for medical management. The patient's anti-hypertensive regimen was adjusted. More specifically his amlodipine and hydralazine were discontinued. The patient was discharged on 06/19/2014 with instructions to finish 6 weeks of ceftazidime and vancomycin. Wound cultures obtained intraoperatively grew Proteus mirabilis. The patient was discharged to go to [facility]. Unfortunately, the patient had a mechanical fall during therapy. The patient hit his left leg on to some type of railing causing complicated  laceration. He is brought to the emergency department. His orthopedic surgeon, Dr. Carola Frost was consulted and plans to close the patient's wound in the operating room. [facility] was requested to admit patient due to his multiple medical issues.  Assessment/Plan: Hypotension -Multifactorial including possible sepsis, opioids volume depletion, cardiomyopathy, and possible adrenal insufficiency in the setting of 3 antihypertensive medications -Overall improved after IV fluid and stress steroid-induced -At baseline, the patient's systolic blood pressure usually is in the mid 90s to low 100s -Obtain blood cultures 2 sets--neg to date -initial lactic acid 2.2 -Discontinue Imdur and carvedilol for now. Continue amiodarone if his blood pressure is able to tolerate. -His previous amlodipine and hydralazine were discontinued during his last hospital admission Laceration left lower extremity -06/23/2014--surgically repaired -wound care per Dr. Carola Frost -Non-weight bearing on Left leg Osteomyelitis left femur -Status post I&D 06/08/2014, Dr. Carola Frost -Wound care per orthopedics -Continue ceftazidime and vancomycin on dialysis--start date 06/12/2014 -patient was previously seen by infectious disease on his last admission whom recommended 6 weeks of intravenous antibiotics-Blood cultures remain negative -Patient is afebrile with soft blood pressures remained soft -Physical therapy evaluation when orthopedics feels the patient is stable Chronic systolic CHF/nonischemic cardiomyopathy -Previously had EF as low as 15-20% -Follows Dr. [name] -Most recent echocardiogram 06/13/2013 showed EF 60% - resume lower dose carvedilol and resume Imdur once blood pressure allows  -has had Unna boots on his bilateral lower extremities for worsening leg edema for the past month which was attributable to the patient's worsening renal function  -He is not in any respiratory distress--his mild hypoxemia likely due to atelectasis  --oxygen saturation 96-99 percent on RA -Encourage incentive spirometry Thrombocytopenia -This has been chronic dating back to November 2013 -likely due to chronic hep C with 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 -  largely stable  -Intermittent worsening likely due to the patient's infectious process with associated myelosuppression -Monitor for bleeding complications -B12 on 06/11/14 was low normal at 251--add B12 supplement po -HIT panel if continued drop 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 -Dialysis per nephrology -Patient has only resumed hemodialysis within the past month  -AV fistula creation 06/01/2014  -Metabolic bone disease per nephrology -Dialysis Monday, Wednesday, Friday -Continue chronic home dose of prednisone 5 mg daily after surgery Paroxysmal atrial flutter  -Presently in sinus rhythm  -Not on anticoagulation secondary to GI bleed  -restart aspirin in am if ok with ortho -resume carvedilol when BP allows -Continue amiodarone  Anemia of CKD -Patient's baseline hemoglobin has had wide variations but baseline seems to be around 11 -Drop in hemoglobin some blood loss -Aranesp per nephrology -Repeat CBC in a.m.--transfuse for hemoglobin less than 7  Hypertension -resume carvedilol and imdur when able Hyperlipidemia -Continue pravastatin  DISPO:  SNF WHEN CLEARED BY ORTHO--?5/29 OR 5/30          Procedures/Studies: Dg Tibia/fibula Left  06/22/2014   CLINICAL DATA:  Deep laceration to left lower leg  EXAM: LEFT TIBIA AND FIBULA - 2 VIEW  COMPARISON:  06/08/2014  FINDINGS: Postoperative changes are again seen in the distal left femur stable from the prior exam. Diffuse soft tissue swelling is noted in the calf. A large skin laceration is noted consistent with the patient's given clinical history. No underlying bony abnormality is seen. No erosion to suggest osteomyelitis is seen. An old healing distal fibular fracture is noted.  IMPRESSION: Soft tissue defect  consistent with the given clinical history. Postoperative changes in the distal femur are noted. Healing distal fibular fracture is seen as well.   Electronically Signed   By: Alcide Clever M.D.   On: 06/22/2014 14:22   Dg Chest Port 1 View  06/23/2014   CLINICAL DATA:  Central line placement.  Initial encounter.  EXAM: PORTABLE CHEST - 1 VIEW  COMPARISON:  Chest radiograph performed 06/09/2014  FINDINGS: The patient's right IJ line is noted ending about the distal SVC.  A left-sided dual-lumen catheter is noted ending within the right atrium.  The lungs are hypoexpanded. Left basilar airspace opacity likely reflects atelectasis, similar in appearance to the prior study. No definite focal pneumothorax is seen.  The cardiomediastinal silhouette is mildly enlarged. No acute osseous abnormalities are identified.  IMPRESSION: 1. Right IJ line noted ending about the distal SVC. 2. Lungs hypoexpanded. Left basilar airspace opacity likely reflects atelectasis, similar in appearance to the prior study. 3. Mild cardiomegaly.   Electronically Signed   By: Roanna Raider M.D.   On: 06/23/2014 19:49   Dg Chest Port 1 View  06/09/2014   CLINICAL DATA:  Central line placement.  EXAM: PORTABLE CHEST - 1 VIEW  COMPARISON:  06/08/2014  FINDINGS: Shallow inspiration with atelectasis in the lung bases. Cardiac enlargement. Pulmonary vascularity is normal. Left central venous catheter remains in place without change. Interval placement of a right central venous catheter with tip over the cavoatrial junction region. No pneumothorax. Calcified and tortuous aorta. Degenerative changes in the right shoulder.  IMPRESSION: Appliances appear to be in satisfactory location. Shallow inspiration with atelectasis in the lung bases.   Electronically Signed   By: Burman Nieves M.D.   On: 06/09/2014 00:52   Dg Chest Portable 1 View  06/08/2014   CLINICAL DATA:  Preop leg surgery  EXAM: PORTABLE CHEST - 1 VIEW  COMPARISON:  03/20/2014  FINDINGS: Interval placement of dialysis catheter. Left jugular catheter tip in the lower SVC/RA junction. Tip not well visualized.  Hypoventilation with decreased lung volume and bibasilar atelectasis. Negative for edema or effusion.  IMPRESSION: Hypoventilation with bibasilar atelectasis.   Electronically Signed   By: Marlan Palau M.D.   On: 06/08/2014 11:00   Dg Knee Left Port  06/08/2014   CLINICAL DATA:  Postoperative images of the left knee after incision and drainage with antibiotic beads  EXAM: PORTABLE LEFT KNEE - 1-2 VIEW  COMPARISON:  03/20/2014  FINDINGS: Numerous antibiotic beads project over fracture distal femoral metaphysis. The fracture remains mildly displaced with improved positioning and alignment in the anterior posterior orientation. There is soft tissue heterogeneity over the knee joint with catheter within the inferior aspect of the joint consistent with postoperative change. Air in the soft tissues laterally adjacent to and superior to the knee joint may reflect changes of infection as well.  There are lucencies over the distal femoral shaft measuring an area of about 7 cm in length. This appears to represent bone that has been evacuated of previous antibiotic beads.  IMPRESSION: Postoperative change with fracture distal femur.  Areas of lucency in distal femoral shaft where antibiotic beads previously were present. Beats are now concentrated more inferiorly in the region of the metaphysis with a few in the lateral soft tissues.   Electronically Signed   By: Esperanza Heir M.D.   On: 06/08/2014 16:54   Dg Abd Portable 1v  06/16/2014   CLINICAL DATA:  Abdominal pain at dialysis today.  EXAM: PORTABLE ABDOMEN - 1 VIEW  COMPARISON:  07/24/2012  FINDINGS: The bowel gas pattern is normal. No radio-opaque calculi or other significant radiographic abnormality are seen in the abdomen.  The heart is mildly enlarged. Patient has dialysis catheter projecting over the right atrium. There is  patchy density at the lung bases consistent with atelectasis or early infiltrates.  IMPRESSION: 1. Nonobstructive bowel gas pattern. 2. Atelectasis or early infiltrates in the lung bases.   Electronically Signed   By: Norva Pavlov M.D.   On: 06/16/2014 18:17   Dg C-arm 61-120 Min  06/08/2014   CLINICAL DATA:  Incision and drainage with antibiotic bead removal from left thigh. Chronic osteomyelitis.  EXAM: DG C-ARM 61-120 MIN; LEFT FEMUR 2 VIEWS  COMPARISON:  Radiographs 03/14/2014 and 03/20/2014.  FLUOROSCOPY TIME:  C-arm fluoroscopic images were obtained intraoperatively and submitted for post operative interpretation. Please see the performing provider's procedural report for the fluoroscopy time utilized.  FINDINGS: Four spot fluoroscopic images of the distal left femur demonstrate interval removal of the antibiotic impregnated beads. The comminuted fracture of the distal femur appears grossly stable with impaction and posterior displacement. There is gas within the surgical bed.  IMPRESSION: Intraoperative views during antibiotic impregnated bead removal.   Electronically Signed   By: Carey Bullocks M.D.   On: 06/08/2014 14:30   Dg Femur Min 2 Views Left  06/08/2014   CLINICAL DATA:  Incision and drainage with antibiotic bead removal from left thigh. Chronic osteomyelitis.  EXAM: DG C-ARM 61-120 MIN; LEFT FEMUR 2 VIEWS  COMPARISON:  Radiographs 03/14/2014 and 03/20/2014.  FLUOROSCOPY TIME:  C-arm fluoroscopic images were obtained intraoperatively and submitted for post operative interpretation. Please see the performing provider's procedural report for the fluoroscopy time utilized.  FINDINGS: Four spot fluoroscopic images of the distal left femur demonstrate interval removal of the antibiotic impregnated beads. The comminuted fracture of the distal femur appears grossly stable  with impaction and posterior displacement. There is gas within the surgical bed.  IMPRESSION: Intraoperative views during  antibiotic impregnated bead removal.   Electronically Signed   By: Carey Bullocks M.D.   On: 06/08/2014 14:30         Subjective: Patient denies fevers, chills, headache, chest pain, dyspnea, nausea, vomiting, diarrhea, abdominal pain, dysuria, hematuria   Objective: Filed Vitals:   06/23/14 2057 06/24/14 0507 06/24/14 0704 06/24/14 0920  BP: 103/56 80/49 90/55  92/58  Pulse: 83 77 76 91  Temp: 98.5 F (36.9 C) 98.8 F (37.1 C)  98 F (36.7 C)  TempSrc:    Oral  Resp: Height:      Weight:      SpO2: 97% 95%  100%    Intake/Output Summary (Last 24 hours) at 06/24/14 1726 Last data filed at 06/24/14 0900  Gross per 24 hour  Intake    480 ml  Output    728 ml  Net   -248 ml   Weight change: -1.327 kg (-2 lb 14.8 oz) Exam:   General:  Pt is alert, follows commands appropriately, not in acute distress  HEENT: No icterus, No thrush,  [address]/AT  Cardiovascular: RRR, S1/S2, no rubs, no gallops  Respiratory: CTA bilaterally, no wheezing, no crackles, no rhonchi  Abdomen: Soft/+BS, non tender, non distended, no guarding  Extremities: 1+LE edema, No lymphangitis, No petechiae, No rashes, no synovitis;  UNNA boots on bilat LE  Data Reviewed: Basic Metabolic Panel:  Recent Labs Lab 06/19/14 0813 06/22/14 1354 06/23/14 0839 06/23/14 1429  NA 131* 134* 136 133*  K 4.1 3.9 4.5 4.2  CL 94* 97* 97* 98*  CO2 25 25  --  24  GLUCOSE 92 92 137* 117*  BUN 31* 14 27* 26*  CREATININE 5.42* 3.85* 4.60* 5.15*  CALCIUM 9.6 9.2  --  8.9  PHOS 3.3  --   --  4.6   Liver Function Tests:  Recent Labs Lab 06/19/14 0813 06/23/14 1429  ALBUMIN 2.2* 2.0*   No results for input(s): LIPASE, AMYLASE in the last 168 hours. No results for input(s): AMMONIA in the last 168 hours. CBC:  Recent Labs Lab 06/19/14 0812 06/22/14 1354 06/23/14 0839 06/23/14 1410 06/24/14 1125  WBC 13.2* 18.3*  --  18.4* 15.2*  NEUTROABS  --  13.5*  --   --   --   HGB 9.1* 9.9* 9.2*  8.2* 8.0*  HCT 28.8* 31.7* 27.0* 26.7* 26.0*  MCV 95.4 97.5  --  96.7 99.6  PLT 172 133*  --  143* 97*   Cardiac Enzymes: No results for input(s): CKTOTAL, CKMB, CKMBINDEX, TROPONINI in the last 168 hours. BNP: Invalid input(s): POCBNP CBG: No results for input(s): GLUCAP in the last 168 hours.  Recent Results (from the past 240 hour(s))  Culture, blood (routine x 2)     Status: None (Preliminary result)   Collection Time: 06/22/14  4:17 PM  Result Value Ref Range Status   Specimen Description BLOOD RIGHT HAND  Final   Special Requests BOTTLES DRAWN AEROBIC ONLY 3CC  Final   Culture   Final           BLOOD CULTURE RECEIVED NO GROWTH TO DATE CULTURE WILL BE HELD FOR 5 DAYS BEFORE ISSUING A FINAL NEGATIVE REPORT Performed at [facility]    Report Status PENDING  Incomplete  Culture, blood (routine x 2)     Status: None (Preliminary result)   Collection  Time: 06/22/14  4:28 PM  Result Value Ref Range Status   Specimen Description BLOOD RIGHT HAND  Final   Special Requests BOTTLES DRAWN AEROBIC ONLY 2CC  Final   Culture   Final           BLOOD CULTURE RECEIVED NO GROWTH TO DATE CULTURE WILL BE HELD FOR 5 DAYS BEFORE ISSUING A FINAL NEGATIVE REPORT Note: Culture results may be compromised due to an inadequate volume of blood received in culture bottles. Performed at [facility]    Report Status PENDING  Incomplete     Scheduled Meds: . amiodarone  100 mg Oral Daily  . ceftAZIDime (FORTAZ) IVPB 2 gram/50 mL D5W (Pyxis)  2 g Intravenous Q M,W,F-HD  . Chlorhexidine Gluconate Cloth  6 each Topical Q0600  . cholecalciferol  5,000 Units Oral Daily  . [START ON 06/30/2014] darbepoetin (ARANESP) injection - DIALYSIS  100 mcg Intravenous Q Fri-HD  . heparin  5,000 Units Subcutaneous 3 times per day  . multivitamin  1 tablet Oral QHS  . mupirocin ointment  1 application Nasal BID  . pravastatin  40 mg Oral q1800  . predniSONE  5 mg Oral Q breakfast  . saccharomyces  boulardii  250 mg Oral BID  . sodium chloride  10-40 mL Intracatheter Q12H  . sodium chloride  3 mL Intravenous Q12H  . vancomycin  1,000 mg Intravenous Q M,W,F-HD   Continuous Infusions:    Jayleah Garbers, DO  [name] Pager [phone]  If 7PM-7AM, please contact night-coverage [email or web address] Password [email or web address] 06/24/2014, 5:26 PM   LOS: 2 days

## 2014-06-24 NOTE — Op Note (Signed)
[name], [name] NO.:  [id number]  MEDICAL RECORD NO.:  [id number]  LOCATION:  [id number]                        FACILITY:  [facility]  PHYSICIAN:  Doralee Albino. Carola Frost, M.D. DATE OF BIRTH:  11/10/1955  DATE OF PROCEDURE:  06/23/2014 DATE OF DISCHARGE:                              OPERATIVE REPORT   PREOPERATIVE DIAGNOSES: 1. Complex wound/traumatic laceration, left leg. 2. Left plantar heel ulcer.  POSTOPERATIVE DIAGNOSES: 1. Complex wound/traumatic laceration, left leg. 2. Left plantar heel ulcer.  PROCEDURES: 1. Debridement of skin and subcutaneous tissue with evacuation of     hematoma. 2. Complex closure using retention sutures, 12 cm. 3. Debridement of left heel ulcer with pairing and excision of skin.  SURGEON:  Doralee Albino. Carola Frost, M.D.  ASSISTANT:  Mearl Latin, PA-C.  ANESTHESIA:  General.  COMPLICATION:  None.  SPECIMENS:  None.  DRAINS:  Two 4-inch Penroses.  DISPOSITION:  To PACU.  CONDITION:  Stable.  BRIEF SUMMARY AND INDICATION FOR PROCEDURE:  Chad [name] is a 59 year old with multiple medical problems including chronic osteomyelitis, hepatitis C and end-stage renal disease, who has been treated with Unna boots bilaterally.  He was recently discharged from the hospital for a partial excision of his femur secondary to osteomyelitis.  At the nursing facility, he had a skin injury to the left leg just at the level of his Unna boot.  He was taken to [facility] where he was evaluated and we were asked to consult for definitive treatment.  He was admitted to the Medical Service given the multitude in severity of his medical issues.  I did discuss with the patient the risks and benefits of washout and debridement.  These include failure to prevent infection, skin breakdown that could require prolonged wound treatment, deep infection and could result in limb loss.  The patient had pressure being held to his right arm at that time and he  did give consent verbally. Ultimately, they were unable to obtain venous access despite multiple attempts by multiple personnel and consequently, a central line was placed.  BRIEF SUMMARY OF PROCEDURE:  [name] received Brigid Re and has been on vancomycin, which he gets with dialysis three times a week.  His left lower extremity was treated very delicately and scrubbed to remove numerous layers of dead skin with a gentle sponge.  The heel ulcer was identified and paired around the edges using a 10-blade.  I then turned attention around the wound, which measured 13 cm x 8 cm at its widest margins.  This area was carefully prepped and draped again using chlorhexidine scrub and Betadine scrub and paint.  We did not give Betadine directly in the wound.  I then used chlorhexidine soap and gentle lavage after a time-out to remove the hematoma.  Assistant helped me keep the leg rotated for access and exposure.  I did remove some devitalized skin and adipose.  The anterior flap of skin was severely damaged and will likely go onto full necrosis, but was placed back covering the wound as a biologic dressing.  Majority of the flap was then delicately brought together using combination of 2-0 PDS and the large retention-type far-near-near-far sutures.  Two Penroses were placed deep into the wound and then dressing placed over top of these. A Coban pressure dressing after layer of Kerlix was then placed, which was very gentle pressure from the foot to the thigh to help control his edema.  The patient was then awakened from anesthesia and transported to the PACU in stable condition.  PROGNOSIS:  [name] will undergo dressing change with retention of the Mepitel and change of the overlying gauze in 2 days while holding the foot only to avoid creating a new skin injury.  We will ask for the assistance of Dr. Lajoyce Corners as well given his expertise with these types of wounds and injuries.  We will  continue to follow him for his osteomyelitis as well.  Drainage does persist, but has decreased. Because of the multitude of medical problems including the congestive heart failure, hepatitis C, chronic osteomyelitis and end-stage renal disease, he remains at high risk for further complications.     Doralee Albino. Carola Frost, M.D.     MHH/MEDQ  D:  06/23/2014  T:  06/24/2014  Job:  [id number]

## 2014-06-24 NOTE — Progress Notes (Signed)
Subjective:     Objective Filed Vitals:   06/23/14 1928 06/23/14 2057 06/24/14 0507 06/24/14 0704  BP: 102/61 103/56 80/49 90/55   Pulse: 76 83 77 76  Temp:  98.5 F (36.9 C) 98.8 F (37.1 C)   TempSrc:      Resp:  18 18   Height:      Weight: 102.7 kg (226 lb 6.6 oz)     SpO2:  97% 95%    Physical Exam General: alert and oriented. No acute distress. Heart: RRR Lungs: CTA, unlabored  Abdomen: soft, nontender +BS  Extremities: no edema/ bilat LE wrapped. Old drainage Dialysis Access: L AVF +b/t maturing. L IJ cath  Dialysis Orders: MWF [facility] 4hr 30 mins 103.5kgs 2K/2.5Ca 400/800 3000u heparin hectorol 1. No ESA or FE  Assessment/Plan: 1. LLE laceration s/p fall- I&D 5/27 per ortho 2. L distal femur osteo- s/p I&D w beads 5/12. outpt culture + for MRSA, inpt + Proteus, remained on vanc and fortaz at outpt center at [facility]- cont antibiotics 3. ESRD - MWF [facility], next HD monday 4. Hypertension/volume - 90/55, coreg on hold,. No volume excess 5. Anemia - last hgb 10.2- down to 8.2 and tsat 22. hgb had been running in the 12, acute blood loss- start Aranesp. No heparin in HD- recheck CBC 6. Metabolic bone disease - correct ca 10.5, last phos 2.8, PTH 474, hold hectorol. Hold phoslo. 2 Ca bath  7. Nutrition - alb 2. Renal diet, viamin and nepro  Jetty Duhamel, NP [employer] [phone] 06/24/2014,10:12 AM  LOS: 2 days    Additional Objective Labs: Basic Metabolic Panel:  Recent Labs Lab 06/19/14 0813 06/22/14 1354 06/23/14 0839 06/23/14 1429  NA 131* 134* 136 133*  K 4.1 3.9 4.5 4.2  CL 94* 97* 97* 98*  CO2 25 25  --  24  GLUCOSE 92 92 137* 117*  BUN 31* 14 27* 26*  CREATININE 5.42* 3.85* 4.60* 5.15*  CALCIUM 9.6 9.2  --  8.9  PHOS 3.3  --   --  4.6   Liver Function Tests:  Recent Labs Lab 06/19/14 0813 06/23/14 1429  ALBUMIN 2.2* 2.0*   No results for input(s): LIPASE, AMYLASE in the last 168  hours. CBC:  Recent Labs Lab 06/19/14 0812 06/22/14 1354 06/23/14 0839 06/23/14 1410  WBC 13.2* 18.3*  --  18.4*  NEUTROABS  --  13.5*  --   --   HGB 9.1* 9.9* 9.2* 8.2*  HCT 28.8* 31.7* 27.0* 26.7*  MCV 95.4 97.5  --  96.7  PLT 172 133*  --  143*   Blood Culture    Component Value Date/Time   SDES BLOOD RIGHT HAND 06/22/2014 1628   SPECREQUEST BOTTLES DRAWN AEROBIC ONLY 2CC 06/22/2014 1628   CULT  06/22/2014 1628           BLOOD CULTURE RECEIVED NO GROWTH TO DATE CULTURE WILL BE HELD FOR 5 DAYS BEFORE ISSUING A FINAL NEGATIVE REPORT Note: Culture results may be compromised due to an inadequate volume of blood received in culture bottles. Performed at [facility]    REPTSTATUS PENDING 06/22/2014 1628    Cardiac Enzymes: No results for input(s): CKTOTAL, CKMB, CKMBINDEX, TROPONINI in the last 168 hours. CBG: No results for input(s): GLUCAP in the last 168 hours. Iron Studies: No results for input(s): IRON, TIBC, TRANSFERRIN, FERRITIN in the last 72 hours. @ Studies/Results: Dg Tibia/fibula Left  06/22/2014   CLINICAL DATA:  Deep laceration to left lower leg  EXAM: LEFT TIBIA AND FIBULA - 2 VIEW  COMPARISON:  06/08/2014  FINDINGS: Postoperative changes are again seen in the distal left femur stable from the prior exam. Diffuse soft tissue swelling is noted in the calf. A large skin laceration is noted consistent with the patient's given clinical history. No underlying bony abnormality is seen. No erosion to suggest osteomyelitis is seen. An old healing distal fibular fracture is noted.  IMPRESSION: Soft tissue defect consistent with the given clinical history. Postoperative changes in the distal femur are noted. Healing distal fibular fracture is seen as well.   Electronically Signed   By: Alcide Clever M.D.   On: 06/22/2014 14:22   Dg Chest Port 1 View  06/23/2014   CLINICAL DATA:  Central line placement.  Initial encounter.  EXAM: PORTABLE CHEST - 1 VIEW   COMPARISON:  Chest radiograph performed 06/09/2014  FINDINGS: The patient's right IJ line is noted ending about the distal SVC.  A left-sided dual-lumen catheter is noted ending within the right atrium.  The lungs are hypoexpanded. Left basilar airspace opacity likely reflects atelectasis, similar in appearance to the prior study. No definite focal pneumothorax is seen.  The cardiomediastinal silhouette is mildly enlarged. No acute osseous abnormalities are identified.  IMPRESSION: 1. Right IJ line noted ending about the distal SVC. 2. Lungs hypoexpanded. Left basilar airspace opacity likely reflects atelectasis, similar in appearance to the prior study. 3. Mild cardiomegaly.   Electronically Signed   By: Roanna Raider M.D.   On: 06/23/2014 19:49   Medications:   . amiodarone  100 mg Oral Daily  . ceftAZIDime (FORTAZ) IVPB 2 gram/50 mL D5W (Pyxis)  2 g Intravenous Q M,W,F-HD  . Chlorhexidine Gluconate Cloth  6 each Topical Q0600  . cholecalciferol  5,000 Units Oral Daily  . [START ON 06/30/2014] darbepoetin (ARANESP) injection - DIALYSIS  100 mcg Intravenous Q Fri-HD  . heparin  5,000 Units Subcutaneous 3 times per day  . multivitamin  1 tablet Oral QHS  . mupirocin ointment  1 application Nasal BID  . pravastatin  40 mg Oral q1800  . predniSONE  5 mg Oral Q breakfast  . saccharomyces boulardii  250 mg Oral BID  . sodium chloride  10-40 mL Intracatheter Q12H  . sodium chloride  3 mL Intravenous Q12H  . vancomycin  1,000 mg Intravenous Q M,W,F-HD

## 2014-06-25 LAB — CBC
HCT: 25 % — ABNORMAL LOW (ref 39.0–52.0)
Hemoglobin: 7.5 g/dL — ABNORMAL LOW (ref 13.0–17.0)
MCH: 29.8 pg (ref 26.0–34.0)
MCHC: 30 g/dL (ref 30.0–36.0)
MCV: 99.2 fL (ref 78.0–100.0)
PLATELETS: 105 10*3/uL — AB (ref 150–400)
RBC: 2.52 MIL/uL — AB (ref 4.22–5.81)
RDW: 20 % — AB (ref 11.5–15.5)
WBC: 14.6 10*3/uL — AB (ref 4.0–10.5)

## 2014-06-25 MED ORDER — DARBEPOETIN ALFA 150 MCG/0.3ML IJ SOSY
150.0000 ug | PREFILLED_SYRINGE | INTRAMUSCULAR | Status: DC
  Administered 2014-06-26: 150 ug via INTRAVENOUS
  Filled 2014-06-25: qty 0.3

## 2014-06-25 MED ORDER — CALCIUM CARBONATE ANTACID 500 MG PO CHEW
1.0000 | CHEWABLE_TABLET | Freq: Four times a day (QID) | ORAL | Status: DC | PRN
  Administered 2014-06-25 – 2014-06-26 (×2): 200 mg via ORAL
  Filled 2014-06-25 (×2): qty 1

## 2014-06-25 NOTE — Progress Notes (Signed)
PROGRESS NOTE  RICHRD KUZNIAR UEA:[id number] DOB: 23-Nov-1955 DOA: 06/22/2014 PCP: [name], MD  Brief History 59 year old male with a history of nonischemic cardiomyopathy, paroxysmal atrial flutter not on anticoagulation secondary to GI bleed, ESRD (MWF) with failed renal transplant (1996), hepatitis C, and osteomyelitis of the left femur. The patient initially had a distal femur fracture in June 2014 and underwent ORIF at that time. He subsequently developed a wound infection and underwent irrigation and debridement. He had been following infectious disease. On 03/14/2014, the patient had his hardware removed and had placement of antibiotic beads. He was seen in the orthopedic office on 06/07/2014. He was noted to have purulent drainage. He was admitted on 06/08/2014 for repeat irrigation and debridement. Unfortunately, the patient developed hypotension post-op that was refractory to 3 doses of colloid infusion. Critical care was consulted, and the patient was transferred to the ICU where he was placed on vasopressors. It was thought that the patient was possibly septic secondary to his wound infection and osteomyelitis as well as residual effects from his anesthesia. He was initially placed on stress steroids which were weaned to his baseline dose. He was weaned off his vasopressors and transferred out of ICU, During that admission, internal medicine consultation was obtained for medical management. The patient's anti-hypertensive regimen was adjusted. More specifically his amlodipine and hydralazine were discontinued. The patient was discharged on 06/19/2014 with instructions to finish 6 weeks of ceftazidime and vancomycin. Wound cultures obtained intraoperatively grew Proteus mirabilis. The patient was discharged to go to [facility]. Unfortunately, the patient had a mechanical fall during therapy. The patient hit his left leg on to some type of railing causing complicated  laceration. He is brought to the emergency department. His orthopedic surgeon, Dr. Carola Frost was consulted and closed the patient's wound in the operating room on 06/23/14. [facility] was requested to admit patient due to his multiple medical issues.  Assessment/Plan: Hypotension -Multifactorial including possible sepsis, opioids volume depletion, cardiomyopathy, and possible adrenal insufficiency in the setting of 3 antihypertensive medications -Overall improved after IV fluid and stress steroid-induced -At baseline, the patient's systolic blood pressure usually is in the mid 90s to low 100s -Obtain blood cultures 2 sets--neg to date -initial lactic acid 2.2 -Discontinue Imdur and carvedilol for now.  -Continue amiodarone if his blood pressure is able to tolerate. -His previous amlodipine and hydralazine were discontinued during his last hospital admission Laceration left lower extremity -06/23/2014--surgically repaired -wound care per Dr. Carola Frost -06/25/14-SPOKE WITH ORTHO on call for Dr. Lacey Jensen to change dressing on 06/26/14 -Non-weight bearing on Left leg Osteomyelitis left femur -Status post I&D 06/08/2014, Dr. Carola Frost -Wound care per orthopedics -Continue ceftazidime and vancomycin on dialysis--start date 06/12/2014--plan through 07/24/14 -patient was previously seen by infectious disease on his last admission whom recommended 6 weeks of intravenous antibiotics-Blood cultures remain negative -Patient is afebrile with soft blood pressures remained soft -Physical therapy evaluation when orthopedics feels the patient is stable Chronic systolic CHF/nonischemic cardiomyopathy -Previously had EF as low as 15-20% -Follows Dr. [name] -Most recent echocardiogram 06/13/2013 showed EF 60% - resume lower dose carvedilol and resume Imdur once blood pressure allows  -has had Unna boots on his bilateral lower extremities for worsening leg edema for the past month which was attributable to the patient's  worsening renal function  -He is not in any respiratory distress--his mild hypoxemia likely due to atelectasis --oxygen saturation 96-99 percent on RA -Encourage incentive spirometry Thrombocytopenia -This has  been chronic dating back to November 2013 -likely due to chronic hep C with 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 -largely stable  -Intermittent worsening likely due to the patient's infectious process with associated myelosuppression -Monitor for bleeding complications -B12 on 06/11/14 was low normal at 251--add B12 supplement po -HIT panel if continued drop 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 -Dialysis per nephrology -Patient has only resumed hemodialysis within the past month  -AV fistula creation 06/01/2014  -Metabolic bone disease per nephrology -Dialysis Monday, Wednesday, Friday -Continue chronic home dose of prednisone 5 mg daily after surgery Paroxysmal atrial flutter  -Presently in sinus rhythm  -Not on anticoagulation secondary to GI bleed  -restart aspirin in am if ok with ortho -resume carvedilol when BP allows -Continue amiodarone  Anemia of CKD -Patient's baseline hemoglobin has had wide variations but baseline seems to be around 11 -Drop in hemoglobin some blood loss -Aranesp per nephrology -Repeat CBC in a.m.--transfuse for hemoglobin less than 7  Hypertension -resume carvedilol and imdur when able Hyperlipidemia -Continue pravastatin  DISPO: SNF 5/30 after HD and dressing change by ortho          Procedures/Studies: Dg Tibia/fibula Left  06/22/2014   CLINICAL DATA:  Deep laceration to left lower leg  EXAM: LEFT TIBIA AND FIBULA - 2 VIEW  COMPARISON:  06/08/2014  FINDINGS: Postoperative changes are again seen in the distal left femur stable from the prior exam. Diffuse soft tissue swelling is noted in the calf. A large skin laceration is noted consistent with the patient's given clinical history. No underlying bony abnormality is seen. No erosion  to suggest osteomyelitis is seen. An old healing distal fibular fracture is noted.  IMPRESSION: Soft tissue defect consistent with the given clinical history. Postoperative changes in the distal femur are noted. Healing distal fibular fracture is seen as well.   Electronically Signed   By: Alcide Clever M.D.   On: 06/22/2014 14:22   Dg Chest Port 1 View  06/23/2014   CLINICAL DATA:  Central line placement.  Initial encounter.  EXAM: PORTABLE CHEST - 1 VIEW  COMPARISON:  Chest radiograph performed 06/09/2014  FINDINGS: The patient's right IJ line is noted ending about the distal SVC.  A left-sided dual-lumen catheter is noted ending within the right atrium.  The lungs are hypoexpanded. Left basilar airspace opacity likely reflects atelectasis, similar in appearance to the prior study. No definite focal pneumothorax is seen.  The cardiomediastinal silhouette is mildly enlarged. No acute osseous abnormalities are identified.  IMPRESSION: 1. Right IJ line noted ending about the distal SVC. 2. Lungs hypoexpanded. Left basilar airspace opacity likely reflects atelectasis, similar in appearance to the prior study. 3. Mild cardiomegaly.   Electronically Signed   By: Roanna Raider M.D.   On: 06/23/2014 19:49   Dg Chest Port 1 View  06/09/2014   CLINICAL DATA:  Central line placement.  EXAM: PORTABLE CHEST - 1 VIEW  COMPARISON:  06/08/2014  FINDINGS: Shallow inspiration with atelectasis in the lung bases. Cardiac enlargement. Pulmonary vascularity is normal. Left central venous catheter remains in place without change. Interval placement of a right central venous catheter with tip over the cavoatrial junction region. No pneumothorax. Calcified and tortuous aorta. Degenerative changes in the right shoulder.  IMPRESSION: Appliances appear to be in satisfactory location. Shallow inspiration with atelectasis in the lung bases.   Electronically Signed   By: Burman Nieves M.D.   On: 06/09/2014 00:52   Dg Chest Portable 1  View  06/08/2014  CLINICAL DATA:  Preop leg surgery  EXAM: PORTABLE CHEST - 1 VIEW  COMPARISON:  03/20/2014  FINDINGS: Interval placement of dialysis catheter. Left jugular catheter tip in the lower SVC/RA junction. Tip not well visualized.  Hypoventilation with decreased lung volume and bibasilar atelectasis. Negative for edema or effusion.  IMPRESSION: Hypoventilation with bibasilar atelectasis.   Electronically Signed   By: Marlan Palau M.D.   On: 06/08/2014 11:00   Dg Knee Left Port  06/08/2014   CLINICAL DATA:  Postoperative images of the left knee after incision and drainage with antibiotic beads  EXAM: PORTABLE LEFT KNEE - 1-2 VIEW  COMPARISON:  03/20/2014  FINDINGS: Numerous antibiotic beads project over fracture distal femoral metaphysis. The fracture remains mildly displaced with improved positioning and alignment in the anterior posterior orientation. There is soft tissue heterogeneity over the knee joint with catheter within the inferior aspect of the joint consistent with postoperative change. Air in the soft tissues laterally adjacent to and superior to the knee joint may reflect changes of infection as well.  There are lucencies over the distal femoral shaft measuring an area of about 7 cm in length. This appears to represent bone that has been evacuated of previous antibiotic beads.  IMPRESSION: Postoperative change with fracture distal femur.  Areas of lucency in distal femoral shaft where antibiotic beads previously were present. Beats are now concentrated more inferiorly in the region of the metaphysis with a few in the lateral soft tissues.   Electronically Signed   By: Esperanza Heir M.D.   On: 06/08/2014 16:54   Dg Abd Portable 1v  06/16/2014   CLINICAL DATA:  Abdominal pain at dialysis today.  EXAM: PORTABLE ABDOMEN - 1 VIEW  COMPARISON:  07/24/2012  FINDINGS: The bowel gas pattern is normal. No radio-opaque calculi or other significant radiographic abnormality are seen in the  abdomen.  The heart is mildly enlarged. Patient has dialysis catheter projecting over the right atrium. There is patchy density at the lung bases consistent with atelectasis or early infiltrates.  IMPRESSION: 1. Nonobstructive bowel gas pattern. 2. Atelectasis or early infiltrates in the lung bases.   Electronically Signed   By: Norva Pavlov M.D.   On: 06/16/2014 18:17   Dg C-arm 61-120 Min  06/08/2014   CLINICAL DATA:  Incision and drainage with antibiotic bead removal from left thigh. Chronic osteomyelitis.  EXAM: DG C-ARM 61-120 MIN; LEFT FEMUR 2 VIEWS  COMPARISON:  Radiographs 03/14/2014 and 03/20/2014.  FLUOROSCOPY TIME:  C-arm fluoroscopic images were obtained intraoperatively and submitted for post operative interpretation. Please see the performing provider's procedural report for the fluoroscopy time utilized.  FINDINGS: Four spot fluoroscopic images of the distal left femur demonstrate interval removal of the antibiotic impregnated beads. The comminuted fracture of the distal femur appears grossly stable with impaction and posterior displacement. There is gas within the surgical bed.  IMPRESSION: Intraoperative views during antibiotic impregnated bead removal.   Electronically Signed   By: Carey Bullocks M.D.   On: 06/08/2014 14:30   Dg Femur Min 2 Views Left  06/08/2014   CLINICAL DATA:  Incision and drainage with antibiotic bead removal from left thigh. Chronic osteomyelitis.  EXAM: DG C-ARM 61-120 MIN; LEFT FEMUR 2 VIEWS  COMPARISON:  Radiographs 03/14/2014 and 03/20/2014.  FLUOROSCOPY TIME:  C-arm fluoroscopic images were obtained intraoperatively and submitted for post operative interpretation. Please see the performing provider's procedural report for the fluoroscopy time utilized.  FINDINGS: Four spot fluoroscopic images of the distal left femur  demonstrate interval removal of the antibiotic impregnated beads. The comminuted fracture of the distal femur appears grossly stable with  impaction and posterior displacement. There is gas within the surgical bed.  IMPRESSION: Intraoperative views during antibiotic impregnated bead removal.   Electronically Signed   By: Carey Bullocks M.D.   On: 06/08/2014 14:30         Subjective: Patient denies fevers, chills, headache, chest pain, dyspnea, nausea, vomiting, diarrhea, abdominal pain, dysuria, hematuria   Objective: Filed Vitals:   06/24/14 0704 06/24/14 0920 06/25/14 0525 06/25/14 1001  BP: 93/60  Pulse: 76 91 75 93  Temp:  98 F (36.7 C) 98.3 F (36.8 C) 98.6 F (37 C)  TempSrc:  Oral Oral Oral  Resp:  Height:      Weight:      SpO2:  100% 95% 100%    Intake/Output Summary (Last 24 hours) at 06/25/14 1534 Last data filed at 06/25/14 0900  Gross per 24 hour  Intake    840 ml  Output      0 ml  Net    840 ml   Weight change:  Exam:   General:  Pt is alert, follows commands appropriately, not in acute distress  HEENT: No icterus, No thrush,/AT  Cardiovascular: RRR, S1/S2, no rubs, no gallops  Respiratory: CTA bilaterally, no wheezing, no crackles, no rhonchi  Abdomen: Soft/+BS, non tender, non distended, no guarding  Extremities: 1+LE edema, No lymphangitis, No petechiae, No rashes, no synovitis; UNA boots on bilateral LE  Data Reviewed: Basic Metabolic Panel:  Recent Labs Lab 06/19/14 0813 06/22/14 1354 06/23/14 0839 06/23/14 1429  NA 131* 134* 136 133*  K 4.1 3.9 4.5 4.2  CL 94* 97* 97* 98*  CO2 25 25  --  24  GLUCOSE 92 92 137* 117*  BUN 31* 14 27* 26*  CREATININE 5.42* 3.85* 4.60* 5.15*  CALCIUM 9.6 9.2  --  8.9  PHOS 3.3  --   --  4.6   Liver Function Tests:  Recent Labs Lab 06/19/14 0813 06/23/14 1429  ALBUMIN 2.2* 2.0*   No results for input(s): LIPASE, AMYLASE in the last 168 hours. No results for input(s): AMMONIA in the last 168 hours. CBC:  Recent Labs Lab 06/19/14 0812 06/22/14 1354 06/23/14 0839 06/23/14 1410  06/24/14 1125 06/25/14 0455  WBC 13.2* 18.3*  --  18.4* 15.2* 14.6*  NEUTROABS  --  13.5*  --   --   --   --   HGB 9.1* 9.9* 9.2* 8.2* 8.0* 7.5*  HCT 28.8* 31.7* 27.0* 26.7* 26.0* 25.0*  MCV 95.4 97.5  --  96.7 99.6 99.2  PLT 172 133*  --  143* 97* 105*   Cardiac Enzymes: No results for input(s): CKTOTAL, CKMB, CKMBINDEX, TROPONINI in the last 168 hours. BNP: Invalid input(s): POCBNP CBG: No results for input(s): GLUCAP in the last 168 hours.  Recent Results (from the past 240 hour(s))  Culture, blood (routine x 2)     Status: None (Preliminary result)   Collection Time: 06/22/14  4:17 PM  Result Value Ref Range Status   Specimen Description BLOOD RIGHT HAND  Final   Special Requests BOTTLES DRAWN AEROBIC ONLY 3CC  Final   Culture   Final           BLOOD CULTURE RECEIVED NO GROWTH TO DATE CULTURE WILL BE HELD FOR 5 DAYS BEFORE ISSUING A FINAL NEGATIVE REPORT Performed at [facility]  Report Status PENDING  Incomplete  Culture, blood (routine x 2)     Status: None (Preliminary result)   Collection Time: 06/22/14  4:28 PM  Result Value Ref Range Status   Specimen Description BLOOD RIGHT HAND  Final   Special Requests BOTTLES DRAWN AEROBIC ONLY 2CC  Final   Culture   Final           BLOOD CULTURE RECEIVED NO GROWTH TO DATE CULTURE WILL BE HELD FOR 5 DAYS BEFORE ISSUING A FINAL NEGATIVE REPORT Note: Culture results may be compromised due to an inadequate volume of blood received in culture bottles. Performed at [facility]    Report Status PENDING  Incomplete     Scheduled Meds: . amiodarone  100 mg Oral Daily  . ceftAZIDime (FORTAZ) IVPB 2 gram/50 mL D5W (Pyxis)  2 g Intravenous Q M,W,F-HD  . Chlorhexidine Gluconate Cloth  6 each Topical Q0600  . cholecalciferol  5,000 Units Oral Daily  . [START ON 06/26/2014] darbepoetin (ARANESP) injection - DIALYSIS  150 mcg Intravenous Q Mon-HD  . heparin  5,000 Units Subcutaneous 3 times per day  . multivitamin  1  tablet Oral QHS  . mupirocin ointment  1 application Nasal BID  . pravastatin  40 mg Oral q1800  . predniSONE  5 mg Oral Q breakfast  . saccharomyces boulardii  250 mg Oral BID  . sodium chloride  10-40 mL Intracatheter Q12H  . sodium chloride  3 mL Intravenous Q12H  . vancomycin  1,000 mg Intravenous Q M,W,F-HD   Continuous Infusions:    Bernedette Auston, DO  [name] Pager [phone]  If 7PM-7AM, please contact night-coverage [email or web address] Password [facility] 06/25/2014, 3:34 PM   LOS: 3 days

## 2014-06-25 NOTE — Discharge Summary (Signed)
Physician Discharge Summary  Chad [name]:[id number] DOB: July 15, 1955 DOA: 06/22/2014  PCP: [name], MD  Admit date: 06/22/2014 Discharge date: 06/26/14  Recommendations for Outpatient Follow-up:  1. Pt will need to follow up with PCP in 2 weeks post discharge 2. Please obtain BMP and CBC in one week 3. Please continue vancomycin 1 g with each dialysis and ceftazidime 2 g with each dialysis on Monday, Wednesday, Fridays--last dose due 07/24/2014 4. Follow up Dr. Myrene Galas on 07/03/14@0800  5. change dressing Left Leg on 06/28/2014 and 06/30/2014---Leave mepitel layer in place (mesh looking nonstick layer directly on skin) and only change gauze and ace  Discharge Diagnoses:   Hypotension -Multifactorial including possible sepsis, opioids volume depletion, cardiomyopathy, and possible adrenal insufficiency in the setting of 3 antihypertensive medications -Overall improved after IV fluid and stress steroid-induced -At baseline, the patient's systolic blood pressure usually is in the mid 90s to low 100s -Obtain blood cultures 2 sets--neg to date -initial lactic acid 2.2 -Discontinue Imdur and carvedilol initially at the time of admission -However, his blood pressure improved with stress steroid-induced and a small amount of fluid. -He will be restarted on carvedilol and Imdur at the time of discharge -Continue amiodarone if his blood pressure is able to tolerate. -His previous amlodipine and hydralazine were discontinued during his last hospital admission--these will not be restarted -The patient will resume his carvedilol 3.125 mg twice a day and Imdur  Laceration left lower extremity -06/23/2014--surgically repaired -wound care per Dr. Carola Frost -06/26/14-SPOKE WITH Chad Shih Montez Morita, PA-C-->dressing changed on 06/26/14 -follow up at Dr. Magdalene [name] office 07/03/14 at 0800 -Per Ortho--change dressing on 06/28/2014 and 06/30/2014---Leave mepitel layer in place (mesh looking nonstick  layer directly on skin) and only change gauze and ace -Non-weight bearing on Left leg  Osteomyelitis left femur -Status post I&D 06/08/2014, Dr. Carola Frost -Wound care per orthopedics -Continue ceftazidime and vancomycin on dialysis--start date 06/12/2014--plan through 07/24/14 -patient was previously seen by infectious disease on his last admission whom recommended 6 weeks of intravenous antibiotics-Blood cultures remain negative -Patient is afebrile with soft blood pressures remained soft -Physical therapy evaluation when orthopedics feels the patient is stable Chronic systolic CHF/nonischemic cardiomyopathy -Previously had EF as low as 15-20% -Follows Dr. Gala [name] -Most recent echocardiogram 06/13/2013 showed EF 60% - resume lower dose carvedilol and resume Imdur once blood pressure allows  -has had Unna boots on his bilateral lower extremities for worsening leg edema for the past month which was attributable to the patient's worsening renal function  -He is not in any respiratory distress--his mild hypoxemia likely due to atelectasis --oxygen saturation 96-99 percent on RA -Encourage incentive spirometry Thrombocytopenia -This has been chronic dating back to November 2013 -likely due to chronic hep C with 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 -largely stable  -Intermittent worsening likely due to the patient's infectious process with associated myelosuppression -Monitor for bleeding complications -B12 on 06/11/14 was low normal at 251--add B12 supplement po -HIT panel if continued drop 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 -Dialysis per nephrology -Patient has only resumed hemodialysis within the past month  -AV fistula creation 06/01/2014  -Metabolic bone disease per nephrology -Dialysis Monday, Wednesday, Friday -Continue chronic home dose of prednisone 5 mg daily after surgery Paroxysmal atrial flutter  -Presently in sinus rhythm  -Not on anticoagulation secondary to GI bleed   -restart aspirin -resume carvedilol when BP allows -Continue amiodarone  Anemia of CKD -Patient's baseline hemoglobin has had wide variations but baseline seems to be around 11 -Drop in hemoglobin  some blood loss initially, but has remained stable thereafter -Aranesp per nephrology --transfuse for hemoglobin less than 7  Hyperlipidemia -Continue pravastatin  Discharge Condition: stable  Disposition: SNF Follow-up Information    Follow up with [name], MD. Go on 07/03/2014.   Specialty:  Orthopedic Surgery   Why:  For wound re-check--arrive 8-8:30AM   Contact information:   [address] [place] [address] [phone]       Diet:renal with 1200 cc fluid restrict Wt Readings from Last 3 Encounters:  06/26/14 108.8 kg (239 lb 13.8 oz)  06/19/14 104 kg (229 lb 4.5 oz)  06/01/14 106.595 kg (235 lb)    History of present illness:  59 year old male with a history of nonischemic cardiomyopathy, paroxysmal atrial flutter not on anticoagulation secondary to GI bleed, ESRD (MWF) with failed renal transplant (1996), hepatitis C, and osteomyelitis of the left femur. The patient initially had a distal femur fracture in June 2014 and underwent ORIF at that time. He subsequently developed a wound infection and underwent irrigation and debridement. He had been following infectious disease. On 03/14/2014, the patient had his hardware removed and had placement of antibiotic beads. He was seen in the orthopedic office on 06/07/2014. He was noted to have purulent drainage. He was admitted on 06/08/2014 for repeat irrigation and debridement. Unfortunately, the patient developed hypotension post-op that was refractory to 3 doses of colloid infusion. Critical care was consulted, and the patient was transferred to the ICU where he was placed on vasopressors. It was thought that the patient was possibly septic secondary to his wound infection and osteomyelitis as well as  residual effects from his anesthesia. He was initially placed on stress steroids which were weaned to his baseline dose. He was weaned off his vasopressors and transferred out of ICU, During that admission, internal medicine consultation was obtained for medical management. The patient's anti-hypertensive regimen was adjusted. More specifically his amlodipine and hydralazine were discontinued. The patient was discharged on 06/19/2014 with instructions to finish 6 weeks of ceftazidime and vancomycin. Wound cultures obtained intraoperatively grew Proteus mirabilis. The patient was discharged to go to [facility]. Unfortunately, the patient had a mechanical fall during therapy. The patient hit his left leg on to some type of railing causing complicated laceration. He is brought to the emergency department. His orthopedic surgeon, Dr. Carola Frost was consulted and closed the patient's wound in the operating room on 06/23/14. [facility] was requested to admit patient due to his multiple medical issues.The patient's wound dressing was changed on 06/26/2014. The case was discussed with orthopedic team. The patient was stable for discharge. Follow-up was arranged with Dr. Carola Frost for 07/03/2014.  Consultants: Ortho--Dr. Carola Frost Renal  Discharge Exam: Filed Vitals:   06/26/14 1000  BP: 109/66  Pulse: 80  Temp:   Resp: 21   Filed Vitals:   06/26/14 0909 06/26/14 0915 06/26/14 0930 06/26/14 1000  BP: 105/64 115/64 99/62 109/66  Pulse: 85 83 84 80  Temp: 98.5 F (36.9 C)     TempSrc: Oral     Resp: 21 20 20 21   Height:      Weight: 108.8 kg (239 lb 13.8 oz)     SpO2: 100%      General: A&O x 3, NAD, pleasant, cooperative Cardiovascular: RRR, no rub, no gallop, no S3 Respiratory: CTAB, no wheeze, no rhonchi Abdomen:soft, nontender, nondistended, positive bowel sounds Extremities: 2+LE edema, No lymphangitis, no petechiae  Discharge Instructions      Discharge Instructions  Diet - low sodium heart  healthy    Complete by:  As directed      Increase activity slowly    Complete by:  As directed             Medication List    TAKE these medications        allopurinol 100 MG tablet  Commonly known as:  ZYLOPRIM  Take 1 tablet (100 mg total) by mouth 2 (two) times daily.     amiodarone 100 MG tablet  Commonly known as:  PACERONE  Take 1 tablet (100 mg total) by mouth daily.     aspirin 325 MG tablet  Take 325 mg by mouth daily.     calcitRIOL 0.5 MCG capsule  Commonly known as:  ROCALTROL  Take 0.5 mcg by mouth daily.     calcium carbonate (dosed in mg elemental calcium) 1250 (500 CA) MG/5ML  Take 15 mLs (1,500 mg of elemental calcium total) by mouth every 6 (six) hours as needed for indigestion.     carvedilol 3.125 MG tablet  Commonly known as:  COREG  Take 1 tablet (3.125 mg total) by mouth 2 (two) times daily with a meal.     cefTAZidime 2 g in dextrose 5 % 50 mL  Inject 2 g into the vein every Monday, Wednesday, and Friday with hemodialysis.     doxercalciferol 4 MCG/2ML injection  Commonly known as:  HECTOROL  Inject 0.5 mLs (1 mcg total) into the vein every Monday, Wednesday, and Friday with hemodialysis.     isosorbide mononitrate 60 MG 24 hr tablet  Commonly known as:  IMDUR  TAKE 1 TABLETS BY MOUTH EVERY DAY     multivitamin Tabs tablet  Take 1 tablet by mouth at bedtime.     oxyCODONE 5 MG immediate release tablet  Commonly known as:  Oxy IR/ROXICODONE  Take 1-2 tablets (5-10 mg total) by mouth every 3 (three) hours as needed for breakthrough pain.     pravastatin 40 MG tablet  Commonly known as:  PRAVACHOL  TAKE 1 TABLET (40 MG TOTAL) BY MOUTH DAILY.     predniSONE 5 MG tablet  Commonly known as:  DELTASONE  Take 1 tablet (5 mg total) by mouth daily with breakfast.     saccharomyces boulardii 250 MG capsule  Commonly known as:  FLORASTOR  Take 1 capsule (250 mg total) by mouth 2 (two) times daily.     traMADol 50 MG tablet  Commonly known  as:  ULTRAM  Take 1 tablet (50 mg total) by mouth every 6 (six) hours as needed for moderate pain (pain). As needed for pain     vancomycin 1 GM/200ML Soln  Commonly known as:  VANCOCIN  Inject 200 mLs (1,000 mg total) into the vein every Monday, Wednesday, and Friday with hemodialysis.     Vitamin D-3 5000 UNITS Tabs  Take 5,000 Units by mouth daily.         The results of significant diagnostics from this hospitalization (including imaging, microbiology, ancillary and laboratory) are listed below for reference.    Significant Diagnostic Studies: Dg Tibia/fibula Left  06/22/2014   CLINICAL DATA:  Deep laceration to left lower leg  EXAM: LEFT TIBIA AND FIBULA - 2 VIEW  COMPARISON:  06/08/2014  FINDINGS: Postoperative changes are again seen in the distal left femur stable from the prior exam. Diffuse soft tissue swelling is noted in the calf. A large skin laceration is noted consistent with the patient's  given clinical history. No underlying bony abnormality is seen. No erosion to suggest osteomyelitis is seen. An old healing distal fibular fracture is noted.  IMPRESSION: Soft tissue defect consistent with the given clinical history. Postoperative changes in the distal femur are noted. Healing distal fibular fracture is seen as well.   Electronically Signed   By: Alcide Clever M.D.   On: 06/22/2014 14:22   Dg Chest Port 1 View  06/23/2014   CLINICAL DATA:  Central line placement.  Initial encounter.  EXAM: PORTABLE CHEST - 1 VIEW  COMPARISON:  Chest radiograph performed 06/09/2014  FINDINGS: The patient's right IJ line is noted ending about the distal SVC.  A left-sided dual-lumen catheter is noted ending within the right atrium.  The lungs are hypoexpanded. Left basilar airspace opacity likely reflects atelectasis, similar in appearance to the prior study. No definite focal pneumothorax is seen.  The cardiomediastinal silhouette is mildly enlarged. No acute osseous abnormalities are identified.   IMPRESSION: 1. Right IJ line noted ending about the distal SVC. 2. Lungs hypoexpanded. Left basilar airspace opacity likely reflects atelectasis, similar in appearance to the prior study. 3. Mild cardiomegaly.   Electronically Signed   By: Roanna Raider M.D.   On: 06/23/2014 19:49   Dg Chest Port 1 View  06/09/2014   CLINICAL DATA:  Central line placement.  EXAM: PORTABLE CHEST - 1 VIEW  COMPARISON:  06/08/2014  FINDINGS: Shallow inspiration with atelectasis in the lung bases. Cardiac enlargement. Pulmonary vascularity is normal. Left central venous catheter remains in place without change. Interval placement of a right central venous catheter with tip over the cavoatrial junction region. No pneumothorax. Calcified and tortuous aorta. Degenerative changes in the right shoulder.  IMPRESSION: Appliances appear to be in satisfactory location. Shallow inspiration with atelectasis in the lung bases.   Electronically Signed   By: Burman Nieves M.D.   On: 06/09/2014 00:52   Dg Chest Portable 1 View  06/08/2014   CLINICAL DATA:  Preop leg surgery  EXAM: PORTABLE CHEST - 1 VIEW  COMPARISON:  03/20/2014  FINDINGS: Interval placement of dialysis catheter. Left jugular catheter tip in the lower SVC/RA junction. Tip not well visualized.  Hypoventilation with decreased lung volume and bibasilar atelectasis. Negative for edema or effusion.  IMPRESSION: Hypoventilation with bibasilar atelectasis.   Electronically Signed   By: Marlan Palau M.D.   On: 06/08/2014 11:00   Dg Knee Left Port  06/08/2014   CLINICAL DATA:  Postoperative images of the left knee after incision and drainage with antibiotic beads  EXAM: PORTABLE LEFT KNEE - 1-2 VIEW  COMPARISON:  03/20/2014  FINDINGS: Numerous antibiotic beads project over fracture distal femoral metaphysis. The fracture remains mildly displaced with improved positioning and alignment in the anterior posterior orientation. There is soft tissue heterogeneity over the knee joint  with catheter within the inferior aspect of the joint consistent with postoperative change. Air in the soft tissues laterally adjacent to and superior to the knee joint may reflect changes of infection as well.  There are lucencies over the distal femoral shaft measuring an area of about 7 cm in length. This appears to represent bone that has been evacuated of previous antibiotic beads.  IMPRESSION: Postoperative change with fracture distal femur.  Areas of lucency in distal femoral shaft where antibiotic beads previously were present. Beats are now concentrated more inferiorly in the region of the metaphysis with a few in the lateral soft tissues.   Electronically Signed   By: Marcy Salvo  Rubner M.D.   On: 06/08/2014 16:54   Dg Abd Portable 1v  06/16/2014   CLINICAL DATA:  Abdominal pain at dialysis today.  EXAM: PORTABLE ABDOMEN - 1 VIEW  COMPARISON:  07/24/2012  FINDINGS: The bowel gas pattern is normal. No radio-opaque calculi or other significant radiographic abnormality are seen in the abdomen.  The heart is mildly enlarged. Patient has dialysis catheter projecting over the right atrium. There is patchy density at the lung bases consistent with atelectasis or early infiltrates.  IMPRESSION: 1. Nonobstructive bowel gas pattern. 2. Atelectasis or early infiltrates in the lung bases.   Electronically Signed   By: Norva Pavlov M.D.   On: 06/16/2014 18:17   Dg C-arm 61-120 Min  06/08/2014   CLINICAL DATA:  Incision and drainage with antibiotic bead removal from left thigh. Chronic osteomyelitis.  EXAM: DG C-ARM 61-120 MIN; LEFT FEMUR 2 VIEWS  COMPARISON:  Radiographs 03/14/2014 and 03/20/2014.  FLUOROSCOPY TIME:  C-arm fluoroscopic images were obtained intraoperatively and submitted for post operative interpretation. Please see the performing provider's procedural report for the fluoroscopy time utilized.  FINDINGS: Four spot fluoroscopic images of the distal left femur demonstrate interval removal of the  antibiotic impregnated beads. The comminuted fracture of the distal femur appears grossly stable with impaction and posterior displacement. There is gas within the surgical bed.  IMPRESSION: Intraoperative views during antibiotic impregnated bead removal.   Electronically Signed   By: Carey Bullocks M.D.   On: 06/08/2014 14:30   Dg Femur Min 2 Views Left  06/08/2014   CLINICAL DATA:  Incision and drainage with antibiotic bead removal from left thigh. Chronic osteomyelitis.  EXAM: DG C-ARM 61-120 MIN; LEFT FEMUR 2 VIEWS  COMPARISON:  Radiographs 03/14/2014 and 03/20/2014.  FLUOROSCOPY TIME:  C-arm fluoroscopic images were obtained intraoperatively and submitted for post operative interpretation. Please see the performing provider's procedural report for the fluoroscopy time utilized.  FINDINGS: Four spot fluoroscopic images of the distal left femur demonstrate interval removal of the antibiotic impregnated beads. The comminuted fracture of the distal femur appears grossly stable with impaction and posterior displacement. There is gas within the surgical bed.  IMPRESSION: Intraoperative views during antibiotic impregnated bead removal.   Electronically Signed   By: Carey Bullocks M.D.   On: 06/08/2014 14:30     Microbiology: Recent Results (from the past 240 hour(s))  Culture, blood (routine x 2)     Status: None (Preliminary result)   Collection Time: 06/22/14  4:17 PM  Result Value Ref Range Status   Specimen Description BLOOD RIGHT HAND  Final   Special Requests BOTTLES DRAWN AEROBIC ONLY 3CC  Final   Culture   Final           BLOOD CULTURE RECEIVED NO GROWTH TO DATE CULTURE WILL BE HELD FOR 5 DAYS BEFORE ISSUING A FINAL NEGATIVE REPORT Performed at [facility]    Report Status PENDING  Incomplete  Culture, blood (routine x 2)     Status: None (Preliminary result)   Collection Time: 06/22/14  4:28 PM  Result Value Ref Range Status   Specimen Description BLOOD RIGHT HAND  Final    Special Requests BOTTLES DRAWN AEROBIC ONLY 2CC  Final   Culture   Final           BLOOD CULTURE RECEIVED NO GROWTH TO DATE CULTURE WILL BE HELD FOR 5 DAYS BEFORE ISSUING A FINAL NEGATIVE REPORT Note: Culture results may be compromised due to an inadequate volume of blood  received in culture bottles. Performed at [facility]    Report Status PENDING  Incomplete     Labs: Basic Metabolic Panel:  Recent Labs Lab 06/22/14 1354 06/23/14 0839 06/23/14 1429  NA 134* 136 133*  K 3.9 4.5 4.2  CL 97* 97* 98*  CO2 25  --  24  GLUCOSE 92 137* 117*  BUN 14 27* 26*  CREATININE 3.85* 4.60* 5.15*  CALCIUM 9.2  --  8.9  PHOS  --   --  4.6   Liver Function Tests:  Recent Labs Lab 06/23/14 1429  ALBUMIN 2.0*   No results for input(s): LIPASE, AMYLASE in the last 168 hours. No results for input(s): AMMONIA in the last 168 hours. CBC:  Recent Labs Lab 06/22/14 1354 06/23/14 0839 06/23/14 1410 06/24/14 1125 06/25/14 0455 06/26/14 1000  WBC 18.3*  --  18.4* 15.2* 14.6* 11.8*  NEUTROABS 13.5*  --   --   --   --   --   HGB 9.9* 9.2* 8.2* 8.0* 7.5* 8.0*  HCT 31.7* 27.0* 26.7* 26.0* 25.0* 26.0*  MCV 97.5  --  96.7 99.6 99.2 96.7  PLT 133*  --  143* 97* 105* 114*   Cardiac Enzymes: No results for input(s): CKTOTAL, CKMB, CKMBINDEX, TROPONINI in the last 168 hours. BNP: Invalid input(s): POCBNP CBG: No results for input(s): GLUCAP in the last 168 hours.  Time coordinating discharge:  Greater than 30 minutes  Signed:  Epic Tribbett, DO [name] Pager: [phone] 06/26/2014, 10:40 AM

## 2014-06-25 NOTE — Progress Notes (Signed)
Subjective:   No complaints. Feeling well  Objective Filed Vitals:   06/24/14 0507 06/24/14 0704 06/24/14 0920 06/25/14 0525  BP: 93/53  Pulse: 77 76 91 75  Temp: 98.8 F (37.1 C)  98 F (36.7 C) 98.3 F (36.8 C)  TempSrc:   Oral Oral  Resp: Height:      Weight:      SpO2: 95%  100% 95%   Physical Exam General: alert and oriented, no acute distress Heart: RRR Lungs: CTA, unlabored  Abdomen: soft, nontender +BS  Extremities: no edema. ACE wrap intact Dialysis Access: L AVF +b/t maturing. L IJ cath  Dialysis Orders: MWF [facility] 4hr 30 mins 103.5kgs 2K/2.5Ca 400/800 3000u heparin hectorol 1. No ESA or FE  Assessment/Plan: 1. LLE laceration s/p fall- I&D 5/27 per ortho 2. L distal femur osteo- s/p I&D w beads 5/12. outpt culture + for MRSA, inpt + Proteus, remained on vanc and fortaz at outpt center at [facility]- cont antibiotics 3. ESRD - MWF [facility], next HD monday 4. Hypertension/volume - 93/53, coreg on hold,. No volume excess/ under edw 5. Anemia - last hgb 10.2- down to 8.7 and tsat 22. hgb had been running in the 12, acute blood loss- start Aranesp. No heparin in HD. Transfuse PRN 6. Metabolic bone disease - correct ca 10.5, last phos 2.8, PTH 474, hold hectorol. Hold phoslo. 2 Ca bath  7. Nutrition - alb 2. Renal diet, viamin and nepro  Jetty Duhamel, NP [employer] [phone] 06/25/2014,9:10 AM  LOS: 3 days    Additional Objective Labs: Basic Metabolic Panel:  Recent Labs Lab 06/19/14 0813 06/22/14 1354 06/23/14 0839 06/23/14 1429  NA 131* 134* 136 133*  K 4.1 3.9 4.5 4.2  CL 94* 97* 97* 98*  CO2 25 25  --  24  GLUCOSE 92 92 137* 117*  BUN 31* 14 27* 26*  CREATININE 5.42* 3.85* 4.60* 5.15*  CALCIUM 9.6 9.2  --  8.9  PHOS 3.3  --   --  4.6   Liver Function Tests:  Recent Labs Lab 06/19/14 0813 06/23/14 1429  ALBUMIN 2.2* 2.0*   No results for input(s): LIPASE, AMYLASE in the  last 168 hours. CBC:  Recent Labs Lab 06/19/14 0812 06/22/14 1354  06/23/14 1410 06/24/14 1125 06/25/14 0455  WBC 13.2* 18.3*  --  18.4* 15.2* 14.6*  NEUTROABS  --  13.5*  --   --   --   --   HGB 9.1* 9.9*  < > 8.2* 8.0* 7.5*  HCT 28.8* 31.7*  < > 26.7* 26.0* 25.0*  MCV 95.4 97.5  --  96.7 99.6 99.2  PLT 172 133*  --  143* 97* 105*  < > = values in this interval not displayed. Blood Culture    Component Value Date/Time   SDES BLOOD RIGHT HAND 06/22/2014 1628   SPECREQUEST BOTTLES DRAWN AEROBIC ONLY 2CC 06/22/2014 1628   CULT  06/22/2014 1628           BLOOD CULTURE RECEIVED NO GROWTH TO DATE CULTURE WILL BE HELD FOR 5 DAYS BEFORE ISSUING A FINAL NEGATIVE REPORT Note: Culture results may be compromised due to an inadequate volume of blood received in culture bottles. Performed at [facility]    REPTSTATUS PENDING 06/22/2014 1628    Cardiac Enzymes: No results for input(s): CKTOTAL, CKMB, CKMBINDEX, TROPONINI in the last 168 hours. CBG: No results for input(s): GLUCAP in the last 168 hours. Iron  Studies: No results for input(s): IRON, TIBC, TRANSFERRIN, FERRITIN in the last 72 hours. @lablastinr3 @ Studies/Results: Dg Chest Port 1 View  06/23/2014   CLINICAL DATA:  Central line placement.  Initial encounter.  EXAM: PORTABLE CHEST - 1 VIEW  COMPARISON:  Chest radiograph performed 06/09/2014  FINDINGS: The patient's right IJ line is noted ending about the distal SVC.  A left-sided dual-lumen catheter is noted ending within the right atrium.  The lungs are hypoexpanded. Left basilar airspace opacity likely reflects atelectasis, similar in appearance to the prior study. No definite focal pneumothorax is seen.  The cardiomediastinal silhouette is mildly enlarged. No acute osseous abnormalities are identified.  IMPRESSION: 1. Right IJ line noted ending about the distal SVC. 2. Lungs hypoexpanded. Left basilar airspace opacity likely reflects atelectasis, similar in appearance to  the prior study. 3. Mild cardiomegaly.   Electronically Signed   By: Roanna Raider M.D.   On: 06/23/2014 19:49   Medications:   . amiodarone  100 mg Oral Daily  . ceftAZIDime (FORTAZ) IVPB 2 gram/50 mL D5W (Pyxis)  2 g Intravenous Q M,W,F-HD  . Chlorhexidine Gluconate Cloth  6 each Topical Q0600  . cholecalciferol  5,000 Units Oral Daily  . [START ON 06/30/2014] darbepoetin (ARANESP) injection - DIALYSIS  100 mcg Intravenous Q Fri-HD  . heparin  5,000 Units Subcutaneous 3 times per day  . multivitamin  1 tablet Oral QHS  . mupirocin ointment  1 application Nasal BID  . pravastatin  40 mg Oral q1800  . predniSONE  5 mg Oral Q breakfast  . saccharomyces boulardii  250 mg Oral BID  . sodium chloride  10-40 mL Intracatheter Q12H  . sodium chloride  3 mL Intravenous Q12H  . vancomycin  1,000 mg Intravenous Q M,W,F-HD

## 2014-06-26 DIAGNOSIS — I480 Paroxysmal atrial fibrillation: Secondary | ICD-10-CM

## 2014-06-26 DIAGNOSIS — I950 Idiopathic hypotension: Secondary | ICD-10-CM

## 2014-06-26 LAB — CBC
HEMATOCRIT: 26 % — AB (ref 39.0–52.0)
HEMOGLOBIN: 8 g/dL — AB (ref 13.0–17.0)
MCH: 29.7 pg (ref 26.0–34.0)
MCHC: 30.8 g/dL (ref 30.0–36.0)
MCV: 96.7 fL (ref 78.0–100.0)
PLATELETS: 114 10*3/uL — AB (ref 150–400)
RBC: 2.69 MIL/uL — ABNORMAL LOW (ref 4.22–5.81)
RDW: 19.3 % — ABNORMAL HIGH (ref 11.5–15.5)
WBC: 11.8 10*3/uL — AB (ref 4.0–10.5)

## 2014-06-26 LAB — RENAL FUNCTION PANEL
ALBUMIN: 1.8 g/dL — AB (ref 3.5–5.0)
ANION GAP: 13 (ref 5–15)
BUN: 28 mg/dL — ABNORMAL HIGH (ref 6–20)
CALCIUM: 8.3 mg/dL — AB (ref 8.9–10.3)
CO2: 23 mmol/L (ref 22–32)
CREATININE: 5.69 mg/dL — AB (ref 0.61–1.24)
Chloride: 97 mmol/L — ABNORMAL LOW (ref 101–111)
GFR, EST AFRICAN AMERICAN: 12 mL/min — AB (ref >60)
GFR, EST NON AFRICAN AMERICAN: 10 mL/min — AB (ref >60)
Glucose, Bld: 120 mg/dL — ABNORMAL HIGH (ref 65–99)
PHOSPHORUS: 4.1 mg/dL (ref 2.5–4.6)
Potassium: 3.7 mmol/L (ref 3.5–5.1)
SODIUM: 133 mmol/L — AB (ref 135–145)

## 2014-06-26 MED ORDER — OXYCODONE HCL 5 MG PO TABS: 5.0000 mg | ORAL_TABLET | ORAL | Status: DC | PRN

## 2014-06-26 MED ORDER — DARBEPOETIN ALFA 150 MCG/0.3ML IJ SOSY
PREFILLED_SYRINGE | INTRAMUSCULAR | Status: AC
  Administered 2014-06-26: 150 ug via INTRAVENOUS
  Filled 2014-06-26: qty 0.3

## 2014-06-26 MED ORDER — DEXTROSE 5 % IV SOLN
2.0000 g | INTRAVENOUS | Status: DC
  Filled 2014-06-26 (×2): qty 2

## 2014-06-26 MED ORDER — OXYCODONE HCL 5 MG PO TABS
ORAL_TABLET | ORAL | Status: AC
  Filled 2014-06-26: qty 2

## 2014-06-26 MED ORDER — TRAMADOL HCL 50 MG PO TABS: 50.0000 mg | ORAL_TABLET | Freq: Four times a day (QID) | ORAL | Status: DC | PRN

## 2014-06-26 NOTE — Evaluation (Signed)
Physical Therapy Evaluation Patient Details Name: Chad [name] MRN: [id number] DOB: 1955-12-06 Today's Date: 06/26/2014   History of Present Illness  59 y/o male with numerous medical comorbidities s/p complex wound to L lower leg. See HPI for extensive history  Clinical Impression  Patient demonstrates deficits in functional mobility as indicated below. Will need continued skilled PT to address deficits and maximize function. Will see as indicated and progress as tolerated.    Follow Up Recommendations SNF;Supervision/Assistance - 24 hour    Equipment Recommendations  None recommended by PT    Recommendations for Other Services       Precautions / Restrictions Precautions Precautions: Fall Precaution Comments: Unna boot on RLE per patient Restrictions Weight Bearing Restrictions: Yes LLE Weight Bearing: Non weight bearing      Mobility  Bed Mobility Overal bed mobility: Needs Assistance Bed Mobility: Supine to Sit     Supine to sit: Min assist Sit to supine: Min assist;+2 for physical assistance;+2 for safety/equipment   General bed mobility comments: Patient assisted to EOB, minimal assist for LLE positioning at EOB and to return to bed.  Mod use of bed rails, increased time  Transfers                    Ambulation/Gait                Stairs            Wheelchair Mobility    Modified Rankin (Stroke Patients Only)       Balance Overall balance assessment: Needs assistance Sitting-balance support: Bilateral upper extremity supported Sitting balance-Leahy Scale: Fair (to good) Sitting balance - Comments: able to take on a challenge but fatigues easily                                     Pertinent Vitals/Pain Pain Assessment: 0-10 Pain Score: 3  Pain Location: LLE pain Pain Descriptors / Indicators: Sore Pain Intervention(s): Repositioned;Limited activity within patient's tolerance;Premedicated before session     Home Living Family/patient expects to be discharged to:: Skilled nursing facility                      Prior Function Level of Independence: Needs assistance   Gait / Transfers Assistance Needed: able to transfer with sliding board, working on SPT at rehab           Hand Dominance   Dominant Hand: Right    Extremity/Trunk Assessment   Upper Extremity Assessment: Overall WFL for tasks assessed           Lower Extremity Assessment: RLE deficits/detail;LLE deficits/detail RLE Deficits / Details: 4/5 strength gross motions on assessment, increased edema despite taping LLE Deficits / Details: did not fully assess strength   Cervical / Trunk Assessment: Normal  Communication   Communication: No difficulties  Cognition Arousal/Alertness: Awake/alert Behavior During Therapy: WFL for tasks assessed/performed Overall Cognitive Status: Within Functional Limits for tasks assessed                      General Comments General comments (skin integrity, edema, etc.): educated on positioning, elevation for edema control.     Exercises        Assessment/Plan    PT Assessment Patient needs continued PT services  PT Diagnosis Difficulty walking;Acute pain;Generalized weakness   PT Problem List Decreased strength;Decreased range of  motion;Decreased activity tolerance;Decreased balance;Decreased mobility;Decreased knowledge of use of DME;Decreased safety awareness;Decreased knowledge of precautions;Pain  PT Treatment Interventions DME instruction;Functional mobility training;Therapeutic activities;Therapeutic exercise;Neuromuscular re-education;Patient/family education;Gait training;Wheelchair mobility training   PT Goals (Current goals can be found in the Care Plan section) Acute Rehab PT Goals Patient Stated Goal: to get back home soon PT Goal Formulation: With patient Time For Goal Achievement: 07/10/14 Potential to Achieve Goals: Good    Frequency Min  3X/week   Barriers to discharge        Co-evaluation               End of Session   Activity Tolerance: Patient tolerated treatment well;Patient limited by fatigue Patient left: in bed;with call bell/phone within reach Nurse Communication: Mobility status         Time: 4496-7591 PT Time Calculation (min) (ACUTE ONLY): 16 min   Charges:   PT Evaluation $Initial PT Evaluation Tier I: 1 Procedure     PT G CodesFabio Asa 07/19/2014, 3:55 PM  Charlotte Crumb, PT DPT  [phone]

## 2014-06-26 NOTE — Discharge Instructions (Signed)
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

## 2014-06-26 NOTE — Progress Notes (Signed)
PT Cancel Note   06/26/14 1440  PT Visit Information  Last PT Received On 06/26/14  Reason Eval/Treat Not Completed Other (comment) (Pt currently in HD.)  Likely will not be able to complete evaluation until tomorrow as pt in HD when PT order received. Noted plan is for pt to return to SNF today, does he require a PT eval in the hospital to return to SNF? Lavona Mound, [place]  [phone] 06/26/2014

## 2014-06-26 NOTE — Progress Notes (Signed)
Orthopedic Tech Progress Note Patient Details:  Chad [name] March 23, 1955 [id number] Applied Unna boot to RLE.  Pulses, sensation, motion intact before and after application.  Capillary refill less than 2 seconds before and after application. Ortho Devices Type of Ortho Device: Radio broadcast assistant Ortho Device/Splint Location: RLE Ortho Device/Splint Interventions: Application   [name] 06/26/2014, 6:48 PM

## 2014-06-26 NOTE — Care Management Note (Signed)
Case Management Note  Patient Details  Name: Chad [name] MRN: [id number] Date of Birth: 1955/10/25  Subjective/Objective:              CM following for progression and d/c planning.      Action/Plan: Plan is for pt to return to SNF, no CM needs, CSW following this pt.   Expected Discharge Date:        06/26/2014          Expected Discharge Plan:  Skilled Nursing Facility  In-House Referral:  Clinical Social Work  Discharge planning Services  CM Consult  Post Acute Care Choice:  NA Choice offered to:  NA  DME Arranged:    DME Agency:     HH Arranged:    HH Agency:     Status of Service:  Completed, signed off  Medicare Important Message Given:  Yes Date Medicare IM Given:  06/26/14 Medicare IM give by:  Johny Shock RN MPH, case manager Date Additional Medicare IM Given:    Additional Medicare Important Message give by:     If discussed at Long Length of Stay Meetings, dates discussed:    Additional Comments:  Starlyn Skeans, RN 06/26/2014, 12:25 PM

## 2014-06-26 NOTE — Progress Notes (Signed)
ANTIBIOTIC CONSULT NOTE - INITIAL  Pharmacy Consult for vancomycin and ceftazidime Indication: osteomyelitis and wound infection  No Known Allergies  Patient Measurements: Height: 6' (182.9 cm) Weight: 239 lb 13.8 oz (108.8 kg) IBW/kg (Calculated) : 77.6   Vital Signs: Temp: 98.5 F (36.9 C) (05/30 0909) Temp Source: Oral (05/30 0909) BP: 107/65 mmHg (05/30 1230) Pulse Rate: 79 (05/30 1230) Intake/Output from previous day: 05/29 0701 - 05/30 0700 In: 1440 [P.O.:1440] Out: 0  Intake/Output from this shift: Total I/O In: 360 [P.O.:360] Out: 100 [Urine:100]  Labs:  Recent Labs  06/23/14 1429 06/24/14 1125 06/25/14 0455 06/26/14 1000 06/26/14 1030  WBC  --  15.2* 14.6* 11.8*  --   HGB  --  8.0* 7.5* 8.0*  --   PLT  --  97* 105* 114*  --   CREATININE 5.15*  --   --   --  5.69*   Estimated Creatinine Clearance: 18.3 mL/min (by C-G formula based on Cr of 5.69).  Recent Labs  06/23/14 1410 06/23/14 1557  VANCOTROUGH  --  15  VANCORANDOM 15  --      Microbiology: Recent Results (from the past 720 hour(s))  Anaerobic culture     Status: None   Collection Time: 06/08/14 11:55 AM  Result Value Ref Range Status   Specimen Description TISSUE THIGH LEFT  Final   Special Requests NONE  Final   Gram Stain   Final    ABUNDANT WBC PRESENT,BOTH PMN AND MONONUCLEAR ABUNDANT GRAM POSITIVE COCCI IN CLUSTERS IN PAIRS Performed at [facility] Gram Stain Report Called to,Read Back By and Verified With: Gram Stain Report Called to,Read Back By and Verified With: Mendel Corning RN 1320 06/08/14 [place] WILSONM Performed at [facility]    Culture   Final    NO ANAEROBES ISOLATED Performed at [facility]    Report Status 06/13/2014 FINAL  Final  Gram stain     Status: None   Collection Time: 06/08/14 11:55 AM  Result Value Ref Range Status   Specimen Description TISSUE THIGH LEFT  Final   Special Requests NONE  Final   Gram Stain   Final    ABUNDANT WBC  PRESENT,BOTH PMN AND MONONUCLEAR ABUNDANT GRAM POSITIVE COCCI IN PAIRS IN CLUSTERS Gram Stain Report Called to,Read Back By and Verified With: Mendel Corning RN 13:20 06/08/14 ([email or web address])    Report Status 06/08/2014 FINAL  Final  Tissue culture     Status: None   Collection Time: 06/08/14 11:55 AM  Result Value Ref Range Status   Specimen Description TISSUE THIGH LEFT  Final   Special Requests NONE  Final   Gram Stain   Final    ABUNDANT WBC PRESENT,BOTH PMN AND MONONUCLEAR ABUNDANT GRAM POSITIVE COCCI IN CLUSTERS IN PAIRS Performed at [facility] Gram Stain Report Called to,Read Back By and Verified With: Gram Stain Report Called to,Read Back By and Verified With: Mendel Corning RN 1320 06/08/14 BY Jasper Riling Performed at [facility]    Culture   Final    MODERATE PROTEUS MIRABILIS Performed at [facility]    Report Status 06/11/2014 FINAL  Final   Organism ID, Bacteria PROTEUS MIRABILIS  Final      Susceptibility   Proteus mirabilis - MIC*    AMPICILLIN >=32 RESISTANT Resistant     AMPICILLIN/SULBACTAM >=32 RESISTANT Resistant     CEFAZOLIN >=64 RESISTANT Resistant     CEFEPIME <=1 SENSITIVE Sensitive     CEFTAZIDIME 4 SENSITIVE  Sensitive     CEFTRIAXONE 4 SENSITIVE Sensitive     CIPROFLOXACIN <=0.25 SENSITIVE Sensitive     GENTAMICIN <=1 SENSITIVE Sensitive     IMIPENEM 2 SENSITIVE Sensitive     PIP/TAZO <=4 SENSITIVE Sensitive     TOBRAMYCIN <=1 SENSITIVE Sensitive     TRIMETH/SULFA <=20 SENSITIVE Sensitive     * MODERATE PROTEUS MIRABILIS  Anaerobic culture     Status: None   Collection Time: 06/08/14  1:58 PM  Result Value Ref Range Status   Specimen Description TISSUE  Final   Special Requests REAMINGS OF LEFT FEMUR PT ON ANCEF AND ZOSYN  Final   Gram Stain   Final    ABUNDANT WBC PRESENT,BOTH PMN AND MONONUCLEAR NO ORGANISMS SEEN Performed at [facility] Performed at [facility]    Culture   Final    NO ANAEROBES ISOLATED Performed  at [facility]    Report Status 06/13/2014 FINAL  Final  Gram stain     Status: None   Collection Time: 06/08/14  1:58 PM  Result Value Ref Range Status   Specimen Description TISSUE  Final   Special Requests REAMINGS OF LEFT FEMUR  Final   Gram Stain   Final    ABUNDANT WBC PRESENT,BOTH PMN AND MONONUCLEAR NO ORGANISMS SEEN    Report Status 06/08/2014 FINAL  Final  Tissue culture     Status: None   Collection Time: 06/08/14  1:58 PM  Result Value Ref Range Status   Specimen Description TISSUE  Final   Special Requests REAMINGS OF LEFT FEMUR  Final   Gram Stain   Final    ABUNDANT WBC PRESENT,BOTH PMN AND MONONUCLEAR NO ORGANISMS SEEN Performed at [facility] Performed at [facility]    Culture   Final    NO GROWTH 3 DAYS Performed at [facility]    Report Status 06/11/2014 FINAL  Final  MRSA PCR Screening     Status: Abnormal   Collection Time: 06/08/14  6:15 PM  Result Value Ref Range Status   MRSA by PCR POSITIVE (A) NEGATIVE Final    Comment:        The GeneXpert MRSA Assay (FDA approved for NASAL specimens only), is one component of a comprehensive MRSA colonization surveillance program. It is not intended to diagnose MRSA infection nor to guide or monitor treatment for MRSA infections. RESULT CALLED TO, READ BACK BY AND VERIFIED WITH: RN,GREG HARDUK [id number]  THANEY   Culture, blood (routine x 2)     Status: None   Collection Time: 06/08/14  7:08 PM  Result Value Ref Range Status   Specimen Description BLOOD RIGHT ANTECUBITAL  Final   Special Requests   Final    BOTTLES DRAWN AEROBIC AND ANAEROBIC 5CCBLUE 3CC RED   Culture   Final    NO GROWTH 5 DAYS Note: Culture results may be compromised due to an inadequate volume of blood received in culture bottles. Performed at [facility]    Report Status 06/15/2014 FINAL  Final  Culture, blood (routine x 2)     Status: None   Collection Time: 06/08/14  7:20 PM   Result Value Ref Range Status   Specimen Description BLOOD RIGHT HAND  Final   Special Requests BOTTLES DRAWN AEROBIC ONLY  Final   Culture   Final    NO GROWTH 5 DAYS Performed at [facility]    Report Status 06/15/2014 FINAL  Final  Urine culture     Status: None   Collection Time: 06/09/14  7:03 AM  Result Value Ref Range Status   Specimen Description URINE, RANDOM  Final   Special Requests NONE  Final   Colony Count NO GROWTH Performed at [facility]   Final   Culture NO GROWTH Performed at [facility]   Final   Report Status 06/10/2014 FINAL  Final  Culture, blood (routine x 2)     Status: None (Preliminary result)   Collection Time: 06/22/14  4:17 PM  Result Value Ref Range Status   Specimen Description BLOOD RIGHT HAND  Final   Special Requests BOTTLES DRAWN AEROBIC ONLY 3CC  Final   Culture   Final           BLOOD CULTURE RECEIVED NO GROWTH TO DATE CULTURE WILL BE HELD FOR 5 DAYS BEFORE ISSUING A FINAL NEGATIVE REPORT Performed at [facility]    Report Status PENDING  Incomplete  Culture, blood (routine x 2)     Status: None (Preliminary result)   Collection Time: 06/22/14  4:28 PM  Result Value Ref Range Status   Specimen Description BLOOD RIGHT HAND  Final   Special Requests BOTTLES DRAWN AEROBIC ONLY 2CC  Final   Culture   Final           BLOOD CULTURE RECEIVED NO GROWTH TO DATE CULTURE WILL BE HELD FOR 5 DAYS BEFORE ISSUING A FINAL NEGATIVE REPORT Note: Culture results may be compromised due to an inadequate volume of blood received in culture bottles. Performed at [facility]    Report Status PENDING  Incomplete    Assessment: 40 YOM with ESRD on HD MWF who was recently admitted 5/12-5/23 for osteomyelitis of L femur. He fell and now has a left lower leg laceration and went to the OR for I&D.  He was discharged on vancomycin and ceftazidime with HD- note that patient had an elevated level of 35 over the  weekend and adjustment was not made- patient continued on 1g IV qHD-MWF. Pharmacy called patient's outpatient center and asked them to hold vancomycin dose scheduled for 5/23- unsure if this was actually completed.  Zosyn 5/12 x 1 dose  Merrem 5/12>> 5/15 CTX 5/15>>5/18 Vanc 5/12>> 5/14; 5/18>> Ceftazidime 5/18>>   5/11 wound cx (picture in 5/23 d/c summary): p.mirabilis, MRSA 5/26 BCX: ngtd  Goal of Therapy:  pre-HD vancomycin level 15-66mcg/mL  Plan:  - Continue vanc 1g IV qHD-MWF - Continue ceftaz 2g IV qMWF after HD - Pre-HD vanc levels as needed - Follow c/s of this admission, clinical progression, HD schedule   Montarius Kitagawa E. Shawnta Schlegel, Pharm.D Clinical Pharmacy Resident Pager: [phone] 06/26/2014 1:42 PM

## 2014-06-26 NOTE — Progress Notes (Signed)
Orthopaedic Trauma Service Progress Note  Subjective  Doing ok Reports no pain meds for R leg in about 2 days Seen on HD unit   Due yesterday for new unna boot to R leg   ROS As above   Objective   BP 112/62 mmHg  Pulse 79  Temp(Src) 98.5 F (36.9 C) (Oral)  Resp 16  Ht 6' (1.829 m)  Wt 108.7 kg (239 lb 10.2 oz)  BMI 32.49 kg/m2  SpO2 100%  Intake/Output      05/29 0701 - 05/30 0700 05/30 0701 - 05/31 0700   P.O. 1440 360   IV Piggyback 0    Total Intake(mL/kg) 1440 (13.2) 360 (3.3)   Urine (mL/kg/hr) 0 (0) 100 (0.3)   Stool 0 (0) 0 (0)   Total Output 0 100   Net +1440 +260        Urine Occurrence 0 x    Stool Occurrence 0 x 1 x     Labs  Results for KAREY, [place] (MRN [id number]) as of 06/26/2014 10:14  Ref. Range 06/26/2014 10:00  WBC Latest Ref Range: 4.0-10.5 K/uL 11.8 (H)  RBC Latest Ref Range: 4.22-5.81 MIL/uL 2.69 (L)  Hemoglobin Latest Ref Range: 13.0-17.0 g/dL 8.0 (L)  HCT Latest Ref Range: 39.0-52.0 % 26.0 (L)  MCV Latest Ref Range: 78.0-100.0 fL 96.7  MCH Latest Ref Range: 26.0-34.0 pg 29.7  MCHC Latest Ref Range: 30.0-36.0 g/dL 06.0  RDW Latest Ref Range: 11.5-15.5 % 19.3 (H)  Platelets Latest Ref Range: 150-400 K/uL 114 (L)    Exam  Gen: resting comfortably in bed, watching TV, on HD Ext:       Left Lower Extremity   Dressing removed  Complex soft tissue wound stable  No surrounding erythema or purulence  Tissue very friable  2 penrose drains removed  Motor and sensory functions at baseline  Heel ulcer and ulcers on lower legs stable        Right Lower Extremity   Unna boot to R leg   Assessment and Plan   POD/HD#: 24   59 y/o male with numerous medical comorbidities s/p complex wound to L lower leg  1. 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 Venous insufficiency   Will have new unna boot applied to R leg before dc  3. Medical issues  Per medicine and renal   4. Dispo  Ortho issues stable  Follow up with ortho on 6/6  Mearl Latin, PA-C [facility] [phone] (O) 06/26/2014 10:14 AM

## 2014-06-26 NOTE — Progress Notes (Signed)
Subjective:   Feeling well, no complaints  Objective Filed Vitals:   06/25/14 1001 06/25/14 1631 06/25/14 2244 06/26/14 0618  BP: 93/60 97/53 102/58 112/62  Pulse: 93 81 80 79  Temp: 98.6 F (37 C) 98.4 F (36.9 C) 99.3 F (37.4 C) 98.5 F (36.9 C)  TempSrc: Oral Oral Oral Oral  Resp: Height:      Weight:   108.7 kg (239 lb 10.2 oz)   SpO2: 100% 94% 95% 100%   Physical Exam General: alert and oriented, no acute distress Heart: RRR Lungs: CTA, unlabored  Abdomen: soft, nontender +BS  Extremities: no edema. ACE wrap intact Dialysis Access: L AVF +b/t maturing. L IJ cath  Dialysis Orders: MWF [facility] 4hr 30 mins 103.5kgs 2K/2.5Ca 400/800 3000u heparin hectorol 1. No ESA or FE  Assessment/Plan: 1. LLE laceration s/p fall- I&D 5/27 per ortho- planning to change dressing today 2. L distal femur osteo- s/p I&D w beads 5/12. outpt culture + for MRSA, inpt + Proteus, remained on vanc and fortaz at outpt center at DC- cont antibiotics- plan through 6/27 3. ESRD - MWF [place], HD pending today 4. Hypertension/volume - 112/62, coreg on hold,. No volume excess/? Accuracy of wts here does have lower ext/ dependent edema 5. Anemia - last hgb 10.2- down to 7.5- asymptomatic and tsat 22. hgb had been running in the 12, acute blood loss- start Aranesp. No heparin in HD. Transfuse PRN 6. Metabolic bone disease - correct ca 10.5, last phos 2.8, PTH 474, hold hectorol. Hold phoslo. 2 Ca bath  7. Nutrition - alb 2. Renal diet, viamin and nepro 8. Dispo-possible DC today back to SNF  Jetty Duhamel, NP [facility] Beeper [phone] 06/26/2014,8:52 AM  LOS: 4 days   Pt seen, examined, agree w assess/plan as above with additions as indicated.  Vinson Moselle MD pager [phone]    cell [phone] 06/26/2014, 2:15 PM       Additional Objective Labs: Basic Metabolic Panel:  Recent Labs Lab 06/22/14 1354 06/23/14 0839 06/23/14 1429   NA 134* 136 133*  K 3.9 4.5 4.2  CL 97* 97* 98*  CO2 25  --  24  GLUCOSE 92 137* 117*  BUN 14 27* 26*  CREATININE 3.85* 4.60* 5.15*  CALCIUM 9.2  --  8.9  PHOS  --   --  4.6   Liver Function Tests:  Recent Labs Lab 06/23/14 1429  ALBUMIN 2.0*   No results for input(s): LIPASE, AMYLASE in the last 168 hours. CBC:  Recent Labs Lab 06/22/14 1354  06/23/14 1410 06/24/14 1125 06/25/14 0455  WBC 18.3*  --  18.4* 15.2* 14.6*  NEUTROABS 13.5*  --   --   --   --   HGB 9.9*  < > 8.2* 8.0* 7.5*  HCT 31.7*  < > 26.7* 26.0* 25.0*  MCV 97.5  --  96.7 99.6 99.2  PLT 133*  --  143* 97* 105*  < > = values in this interval not displayed. Blood Culture    Component Value Date/Time   SDES BLOOD RIGHT HAND 06/22/2014 1628   SPECREQUEST BOTTLES DRAWN AEROBIC ONLY 2CC 06/22/2014 1628   CULT  06/22/2014 1628           BLOOD CULTURE RECEIVED NO GROWTH TO DATE CULTURE WILL BE HELD FOR 5 DAYS BEFORE ISSUING A FINAL NEGATIVE REPORT Note: Culture results may be compromised due to an inadequate volume of blood received in culture bottles. Performed at [facility]    REPTSTATUS PENDING 06/22/2014 1628    Cardiac Enzymes: No results for input(s): CKTOTAL, CKMB, CKMBINDEX, TROPONINI in the last 168 hours. CBG: No results for input(s): GLUCAP in the last 168 hours. Iron Studies: No results for input(s): IRON, TIBC, TRANSFERRIN, FERRITIN in the last 72 hours. @lablastinr3 @ Studies/Results: No results found. Medications:   . amiodarone  100 mg Oral Daily  . ceftAZIDime (FORTAZ) IVPB 2 gram/50 mL D5W (Pyxis)  2 g Intravenous Q M,W,F-HD  . Chlorhexidine Gluconate Cloth  6 each Topical Q0600  . cholecalciferol  5,000 Units Oral Daily  . darbepoetin (ARANESP) injection - DIALYSIS  150 mcg Intravenous Q Mon-HD  . heparin  5,000 Units Subcutaneous 3 times per day  . multivitamin  1 tablet Oral QHS  . mupirocin ointment  1 application Nasal BID  . pravastatin  40 mg Oral q1800  .  predniSONE  5 mg Oral Q breakfast  . saccharomyces boulardii  250 mg Oral BID  . sodium chloride  10-40 mL Intracatheter Q12H  . sodium chloride  3 mL Intravenous Q12H  . vancomycin  1,000 mg Intravenous Q M,W,F-HD

## 2014-06-27 ENCOUNTER — Ambulatory Visit: Payer: Self-pay | Admitting: Internal Medicine

## 2014-06-27 NOTE — Progress Notes (Signed)
Subjective:  No cos awaiting dc to [facility]/ Per SW Insur "has not okayed dc back to his NH yet"  Objective Vital signs in last 24 hours: Filed Vitals:   06/26/14 1713 06/26/14 2055 06/27/14 0459 06/27/14 1000  BP: 89/53 90/51 101/61 106/67  Pulse: 95 98 77 103  Temp: 99 F (37.2 C) 99.7 F (37.6 C) 98.2 F (36.8 C) 98.1 F (36.7 C)  TempSrc: Oral   Oral  Resp: 18 18 17 18   Height:      Weight:  107 kg (235 lb 14.3 oz)    SpO2: 100% 95% 95% 96%   Weight change: 0.1 kg (3.5 oz) Physical Exam General: alert , Nad, Ox3  Heart: RRR, no rub, mur or gallop Lungs: CTA, unlabored breathing  Abdomen: soft, nontender . nondistended +BS  Extremities:Biat lower . ACE wrap intact/noted upper legs/below patella  Edema bilat. Dialysis Access: L Upper arm  AVF +b/t maturing. L IJ perm  cath  Dialysis Orders: MWF [facility] 4hr 30 mins 103.5kgs 2K/2.5Ca 400/800 3000u heparin hectorol 1. No ESA or FE   Problem/Plan: 1. LLE laceration s/p fall- I&D 5/27  ortho- changed dressing yesterday/fu with ortho as they direct. 2. L distal femur osteo- s/p I&D w beads 5/12. outpt culture + for MRSA, inpt + Proteus, remained on vanc and fortaz at outpt center at DC- cont antibiotics- plan through 07/24/14  3. ESRD - MWF [facility], HD pending today 4. Hypertension/volume -  Hypotension during admit hosp multifactorial =possible sepsis, opioids ,volume depletion previous , cm, and possible adrenal insufficiency in the setting of 3 antihypertensive medications- improved with  IV fluid and stress steroids sepsis, bp meds dc , today BP improved 106/67, coreg on hold, has some lower extrem edema  5. Anemia - hgb yest  down to 7.5-  To 8.0 today asymptomatic and tsat 22. hgb op  had been running in the 12, acute blood loss- started Aranesp. No heparin in HD. Transfuse PRN 6. Metabolic bone disease - Ca correct 10.1 /phos 4.1 / PTH 474, holding hectoro/  Hold phoslo. 2 Ca bath  7. Nutrition - alb1.8  2. Renal diet, viamin and nepro 8. Dispo-possible DC today back to SNF  Lenny Pastel, PA-C [facility] [phone] 06/27/2014,4:08 PM  LOS: 5 days   Pt seen, examined and agree w A/P as above.  Vinson Moselle MD pager [phone]    cell [phone] 06/27/2014, 4:30 PM    Labs: Basic Metabolic Panel:  Recent Labs Lab 06/22/14 1354 06/23/14 0839 06/23/14 1429 06/26/14 1030  NA 134* 136 133* 133*  K 3.9 4.5 4.2 3.7  CL 97* 97* 98* 97*  CO2 25  --  24 23  GLUCOSE 92 137* 117* 120*  BUN 14 27* 26* 28*  CREATININE 3.85* 4.60* 5.15* 5.69*  CALCIUM 9.2  --  8.9 8.3*  PHOS  --   --  4.6 4.1   Liver Function Tests:  Recent Labs Lab 06/23/14 1429 06/26/14 1030  ALBUMIN 2.0* 1.8*   CBC:  Recent Labs Lab 06/22/14 1354  06/23/14 1410 06/24/14 1125 06/25/14 0455 06/26/14 1000  WBC 18.3*  --  18.4* 15.2* 14.6* 11.8*  NEUTROABS 13.5*  --   --   --   --   --   HGB 9.9*  < > 8.2* 8.0* 7.5* 8.0*  HCT 31.7*  < > 26.7* 26.0* 25.0* 26.0*  MCV 97.5  --  96.7 99.6 99.2 96.7  PLT 133*  --  143* 97*  105* 114*  < > = values in this interval not displayed. Cardiac Enzymes: No results for input(s): CKTOTAL, CKMB, CKMBINDEX, TROPONINI in the last 168 hours. CBG: No results for input(s): GLUCAP in the last 168 hours.  Studies/Results: No results found. Medications:   . amiodarone  100 mg Oral Daily  . ceftAZIDime (FORTAZ) IVPB 2 gram/50 mL D5W (Pyxis)  2 g Intravenous Q M,W,F-HD  . Chlorhexidine Gluconate Cloth  6 each Topical Q0600  . cholecalciferol  5,000 Units Oral Daily  . darbepoetin (ARANESP) injection - DIALYSIS  150 mcg Intravenous Q Mon-HD  . heparin  5,000 Units Subcutaneous 3 times per day  . multivitamin  1 tablet Oral QHS  . mupirocin ointment  1 application Nasal BID  . pravastatin  40 mg Oral q1800  . predniSONE  5 mg Oral Q breakfast  . saccharomyces boulardii  250 mg Oral BID  . sodium chloride  10-40 mL Intracatheter Q12H  . sodium  chloride  3 mL Intravenous Q12H  . vancomycin  1,000 mg Intravenous Q M,W,F-HD

## 2014-06-27 NOTE — Progress Notes (Signed)
PROGRESS NOTE  Chad [name] ZOX:[id number] DOB: 1955-09-23 DOA: 06/22/2014 PCP: [name], MD  Assessment/Plan: Hypotension -Multifactorial including possible sepsis, opioids volume depletion, cardiomyopathy, and possible adrenal insufficiency in the setting of 3 antihypertensive medications -Overall improved after IV fluid and stress steroids -At baseline, the patient's systolic blood pressure usually is in the mid 90s to low 100s -Obtain blood cultures 2 sets--neg to date -initial lactic acid 2.2 -Discontinue Imdur and carvedilol initially at the time of admission -However, his blood pressure improved with stress steroid-induced and a small amount of fluid. -He will be restarted on carvedilol and Imdur at the time of discharge -Continue amiodarone if his blood pressure is able to tolerate. -His previous amlodipine and hydralazine were discontinued during his last hospital admission--these will not be restarted -The patient will resume his carvedilol 3.125 mg twice a day and Imdur  Laceration left lower extremity -06/23/2014--surgically repaired -wound care per Dr. Carola Frost -06/26/14-SPOKE WITH [name], PA-C-->dressing changed on 06/26/14 -follow up at Dr. [name] office 07/03/14 at 0800 -Per Ortho--change dressing on 06/28/2014 and 06/30/2014---Leave mepitel layer in place (mesh looking nonstick layer directly on skin) and only change gauze and ace -Non-weight bearing on Left leg  -UNNA boot to right leg changed on 06/26/14 Osteomyelitis left femur -Status post I&D 06/08/2014, Dr. Carola Frost -Wound care per orthopedics -Continue ceftazidime and vancomycin on dialysis--start date 06/12/2014--plan through 07/24/14 -patient was previously seen by infectious disease on his last admission whom recommended 6 weeks of intravenous antibiotics-Blood cultures remain negative -Patient is afebrile with soft blood pressures remained soft -Physical therapy evaluation when  orthopedics feels the patient is stable Chronic systolic CHF/nonischemic cardiomyopathy -Previously had EF as low as 15-20% -Follows Dr. 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 -largely stable  -Intermittent worsening likely due to the patient's infectious process with associated myelosuppression -Monitor for bleeding complications -B12 on 06/11/14 was low normal at 251--add B12 supplement po -HIT panel if continued drop 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 -Dialysis per nephrology -Patient has only resumed hemodialysis within the past month  -AV fistula creation 06/01/2014  -Metabolic bone disease per nephrology -Dialysis Monday, Wednesday, Friday -Continue chronic home dose of prednisone 5 mg daily after surgery Paroxysmal atrial flutter  -Presently in sinus rhythm  -Not on anticoagulation secondary to GI bleed  -restart aspirin -resume carvedilol when BP allows -Continue amiodarone  Anemia of CKD -Patient's baseline hemoglobin has had wide variations but baseline seems to be around 11 -Drop in hemoglobin some blood loss initially, but has remained stable thereafter -Aranesp per nephrology --transfuse for hemoglobin less than 7  Hyperlipidemia -Continue pravastatin     Family Communication:   Pt at beside Disposition Plan:   SNF today        Procedures/Studies: Dg Tibia/fibula Left  06/22/2014   CLINICAL DATA:   Deep laceration to left lower leg  EXAM: LEFT TIBIA AND FIBULA - 2 VIEW  COMPARISON:  06/08/2014  FINDINGS: Postoperative changes are  again seen in the distal left femur stable from the prior exam. Diffuse soft tissue swelling is noted in the calf. A large skin laceration is noted consistent with the patient's given clinical history. No underlying bony abnormality is seen. No erosion to suggest osteomyelitis is seen. An old healing distal fibular fracture is noted.  IMPRESSION: Soft tissue defect consistent with the given clinical history. Postoperative changes in the distal femur are noted. Healing distal fibular fracture is seen as well.   Electronically Signed   By: Alcide Clever M.D.   On: 06/22/2014 14:22   Dg Chest Port 1 View  06/23/2014   CLINICAL DATA:  Central line placement.  Initial encounter.  EXAM: PORTABLE CHEST - 1 VIEW  COMPARISON:  Chest radiograph performed 06/09/2014  FINDINGS: The patient's right IJ line is noted ending about the distal SVC.  A left-sided dual-lumen catheter is noted ending within the right atrium.  The lungs are hypoexpanded. Left basilar airspace opacity likely reflects atelectasis, similar in appearance to the prior study. No definite focal pneumothorax is seen.  The cardiomediastinal silhouette is mildly enlarged. No acute osseous abnormalities are identified.  IMPRESSION: 1. Right IJ line noted ending about the distal SVC. 2. Lungs hypoexpanded. Left basilar airspace opacity likely reflects atelectasis, similar in appearance to the prior study. 3. Mild cardiomegaly.   Electronically Signed   By: Roanna Raider M.D.   On: 06/23/2014 19:49   Dg Chest Port 1 View  06/09/2014   CLINICAL DATA:  Central line placement.  EXAM: PORTABLE CHEST - 1 VIEW  COMPARISON:  06/08/2014  FINDINGS: Shallow inspiration with atelectasis in the lung bases. Cardiac enlargement. Pulmonary vascularity is normal. Left central venous catheter remains in place without change. Interval placement of  a right central venous catheter with tip over the cavoatrial junction region. No pneumothorax. Calcified and tortuous aorta. Degenerative changes in the right shoulder.  IMPRESSION: Appliances appear to be in satisfactory location. Shallow inspiration with atelectasis in the lung bases.   Electronically Signed   By: Burman Nieves M.D.   On: 06/09/2014 00:52   Dg Chest Portable 1 View  06/08/2014   CLINICAL DATA:  Preop leg surgery  EXAM: PORTABLE CHEST - 1 VIEW  COMPARISON:  03/20/2014  FINDINGS: Interval placement of dialysis catheter. Left jugular catheter tip in the lower SVC/RA junction. Tip not well visualized.  Hypoventilation with decreased lung volume and bibasilar atelectasis. Negative for edema or effusion.  IMPRESSION: Hypoventilation with bibasilar atelectasis.   Electronically Signed   By: Marlan Palau M.D.   On: 06/08/2014 11:00   Dg Knee Left Port  06/08/2014   CLINICAL DATA:  Postoperative images of the left knee after incision and drainage with antibiotic beads  EXAM: PORTABLE LEFT KNEE - 1-2 VIEW  COMPARISON:  03/20/2014  FINDINGS: Numerous antibiotic beads project over fracture distal femoral metaphysis. The fracture remains mildly displaced with improved positioning and alignment in the anterior posterior orientation. There is soft tissue heterogeneity over the knee joint with catheter within the inferior aspect of the joint consistent with postoperative change. Air in the soft tissues laterally adjacent to and superior to the knee joint may reflect changes of infection as well.  There are lucencies over the distal femoral shaft measuring an area of about 7 cm in length. This appears to represent bone that has been evacuated of previous antibiotic beads.  IMPRESSION: Postoperative change with fracture distal femur.  Areas of lucency in distal femoral shaft where antibiotic beads previously  were present. Beats are now concentrated more inferiorly in the region of the metaphysis with a  few in the lateral soft tissues.   Electronically Signed   By: Esperanza Heir M.D.   On: 06/08/2014 16:54   Dg Abd Portable 1v  06/16/2014   CLINICAL DATA:  Abdominal pain at dialysis today.  EXAM: PORTABLE ABDOMEN - 1 VIEW  COMPARISON:  07/24/2012  FINDINGS: The bowel gas pattern is normal. No radio-opaque calculi or other significant radiographic abnormality are seen in the abdomen.  The heart is mildly enlarged. Patient has dialysis catheter projecting over the right atrium. There is patchy density at the lung bases consistent with atelectasis or early infiltrates.  IMPRESSION: 1. Nonobstructive bowel gas pattern. 2. Atelectasis or early infiltrates in the lung bases.   Electronically Signed   By: Norva Pavlov M.D.   On: 06/16/2014 18:17   Dg C-arm 61-120 Min  06/08/2014   CLINICAL DATA:  Incision and drainage with antibiotic bead removal from left thigh. Chronic osteomyelitis.  EXAM: DG C-ARM 61-120 MIN; LEFT FEMUR 2 VIEWS  COMPARISON:  Radiographs 03/14/2014 and 03/20/2014.  FLUOROSCOPY TIME:  C-arm fluoroscopic images were obtained intraoperatively and submitted for post operative interpretation. Please see the performing provider's procedural report for the fluoroscopy time utilized.  FINDINGS: Four spot fluoroscopic images of the distal left femur demonstrate interval removal of the antibiotic impregnated beads. The comminuted fracture of the distal femur appears grossly stable with impaction and posterior displacement. There is gas within the surgical bed.  IMPRESSION: Intraoperative views during antibiotic impregnated bead removal.   Electronically Signed   By: Carey Bullocks M.D.   On: 06/08/2014 14:30   Dg Femur Min 2 Views Left  06/08/2014   CLINICAL DATA:  Incision and drainage with antibiotic bead removal from left thigh. Chronic osteomyelitis.  EXAM: DG C-ARM 61-120 MIN; LEFT FEMUR 2 VIEWS  COMPARISON:  Radiographs 03/14/2014 and 03/20/2014.  FLUOROSCOPY TIME:  C-arm fluoroscopic  images were obtained intraoperatively and submitted for post operative interpretation. Please see the performing provider's procedural report for the fluoroscopy time utilized.  FINDINGS: Four spot fluoroscopic images of the distal left femur demonstrate interval removal of the antibiotic impregnated beads. The comminuted fracture of the distal femur appears grossly stable with impaction and posterior displacement. There is gas within the surgical bed.  IMPRESSION: Intraoperative views during antibiotic impregnated bead removal.   Electronically Signed   By: Carey Bullocks M.D.   On: 06/08/2014 14:30         Subjective: Patient denies fevers, chills, headache, chest pain, dyspnea, nausea, vomiting, diarrhea, abdominal pain, dysuria, hematuria   Objective: Filed Vitals:   06/26/14 1352 06/26/14 1713 06/26/14 2055 06/27/14 0459  BP: 107/63 89/53 90/51  101/61  Pulse: 78 95 98 77  Temp: 97.9 F (36.6 C) 99 F (37.2 C) 99.7 F (37.6 C) 98.2 F (36.8 C)  TempSrc: Oral Oral    Resp: 17 18 18 17   Height:      Weight:   107 kg (235 lb 14.3 oz)   SpO2:  100% 95% 95%    Intake/Output Summary (Last 24 hours) at 06/27/14 1023 Last data filed at 06/27/14 0600  Gross per 24 hour  Intake    560 ml  Output   3000 ml  Net  -2440 ml   Weight change: 0.1 kg (3.5 oz) Exam:   General:  Pt is alert, follows commands appropriately, not in acute distress  HEENT: No icterus, No thrush, [place]/AT  Cardiovascular: RRR, S1/S2, no rubs, no gallops  Respiratory: CTA bilaterally, no wheezing, no crackles, no rhonchi  Abdomen: Soft/+BS, non tender, non distended, no guarding  Extremities: 2_LE edema, No lymphangitis, No petechiae, No rashes, no synovitis;  UNA boots in place  Data Reviewed: Basic Metabolic Panel:  Recent Labs Lab 06/22/14 1354 06/23/14 0839 06/23/14 1429 06/26/14 1030  NA 134* 136 133* 133*  K 3.9 4.5 4.2 3.7  CL 97* 97* 98* 97*  CO2 25  --  24 23  GLUCOSE 92 137* 117*  120*  BUN 14 27* 26* 28*  CREATININE 3.85* 4.60* 5.15* 5.69*  CALCIUM 9.2  --  8.9 8.3*  PHOS  --   --  4.6 4.1   Liver Function Tests:  Recent Labs Lab 06/23/14 1429 06/26/14 1030  ALBUMIN 2.0* 1.8*   No results for input(s): LIPASE, AMYLASE in the last 168 hours. No results for input(s): AMMONIA in the last 168 hours. CBC:  Recent Labs Lab 06/22/14 1354 06/23/14 0839 06/23/14 1410 06/24/14 1125 06/25/14 0455 06/26/14 1000  WBC 18.3*  --  18.4* 15.2* 14.6* 11.8*  NEUTROABS 13.5*  --   --   --   --   --   HGB 9.9* 9.2* 8.2* 8.0* 7.5* 8.0*  HCT 31.7* 27.0* 26.7* 26.0* 25.0* 26.0*  MCV 97.5  --  96.7 99.6 99.2 96.7  PLT 133*  --  143* 97* 105* 114*   Cardiac Enzymes: No results for input(s): CKTOTAL, CKMB, CKMBINDEX, TROPONINI in the last 168 hours. BNP: Invalid input(s): POCBNP CBG: No results for input(s): GLUCAP in the last 168 hours.  Recent Results (from the past 240 hour(s))  Culture, blood (routine x 2)     Status: None (Preliminary result)   Collection Time: 06/22/14  4:17 PM  Result Value Ref Range Status   Specimen Description BLOOD RIGHT HAND  Final   Special Requests BOTTLES DRAWN AEROBIC ONLY 3CC  Final   Culture   Final           BLOOD CULTURE RECEIVED NO GROWTH TO DATE CULTURE WILL BE HELD FOR 5 DAYS BEFORE ISSUING A FINAL NEGATIVE REPORT Performed at [facility]    Report Status PENDING  Incomplete  Culture, blood (routine x 2)     Status: None (Preliminary result)   Collection Time: 06/22/14  4:28 PM  Result Value Ref Range Status   Specimen Description BLOOD RIGHT HAND  Final   Special Requests BOTTLES DRAWN AEROBIC ONLY 2CC  Final   Culture   Final           BLOOD CULTURE RECEIVED NO GROWTH TO DATE CULTURE WILL BE HELD FOR 5 DAYS BEFORE ISSUING A FINAL NEGATIVE REPORT Note: Culture results may be compromised due to an inadequate volume of blood received in culture bottles. Performed at [facility]    Report Status PENDING   Incomplete     Scheduled Meds: . amiodarone  100 mg Oral Daily  . ceftAZIDime (FORTAZ) IVPB 2 gram/50 mL D5W (Pyxis)  2 g Intravenous Q M,W,F-HD  . Chlorhexidine Gluconate Cloth  6 each Topical Q0600  . cholecalciferol  5,000 Units Oral Daily  . darbepoetin (ARANESP) injection - DIALYSIS  150 mcg Intravenous Q Mon-HD  . heparin  5,000 Units Subcutaneous 3 times per day  . multivitamin  1 tablet Oral QHS  . mupirocin ointment  1 application Nasal BID  . pravastatin  40 mg Oral q1800  . predniSONE  5 mg Oral  Q breakfast  . saccharomyces boulardii  250 mg Oral BID  . sodium chloride  10-40 mL Intracatheter Q12H  . sodium chloride  3 mL Intravenous Q12H  . vancomycin  1,000 mg Intravenous Q M,W,F-HD   Continuous Infusions:    Cleon Signorelli, DO  [name] Pager [phone]  If 7PM-7AM, please contact night-coverage [email or web address] Password [email or web address] 06/27/2014, 10:23 AM   LOS: 5 days

## 2014-06-27 NOTE — Evaluation (Signed)
Occupational Therapy Evaluation Patient Details Name: Chad [name] MRN: [id number] DOB: [birth year] Today's Date: 06/27/2014    History of Present Illness 59 y/o male admitted with hypotension with numerous medical comorbidities s/p complex wound to L lower leg. See HPI for extensive history   Clinical Impression   Pt was able to perform seated UB bathing, dressing, grooming and self feeding in sitting and performed sliding transfers to [facility] at SNF prior to admission.  He performs UB exercises as well. Pt continues to function at this level. Pt reports the plan is for him to discharge back to [facility] later today.  Will defer further OT to SNF.    Follow Up Recommendations  SNF    Equipment Recommendations       Recommendations for Other Services       Precautions / Restrictions Precautions Precautions: Fall Precaution Comments: Unna boot on RLE per patient Restrictions Weight Bearing Restrictions: Yes LLE Weight Bearing: Non weight bearing      Mobility Bed Mobility Overal bed mobility: Needs Assistance Bed Mobility: Supine to Sit;Sit to Supine     Supine to sit: Min assist Sit to supine: Min assist   General bed mobility comments: increased use of rails, extra time, min assist for LEs  Transfers                      Balance     Sitting balance-Leahy Scale: Fair                                      ADL Overall ADL's : Needs assistance/impaired Eating/Feeding: Independent;Sitting   Grooming: Set up;Sitting   Upper Body Bathing: Set up;Sitting   Lower Body Bathing: Maximal assistance;Sitting/lateral leans   Upper Body Dressing : Set up;Sitting   Lower Body Dressing: Total assistance;+2 for physical assistance;Sitting/lateral leans                       Vision     Perception     Praxis      Pertinent Vitals/Pain Pain Assessment: Faces Faces Pain Scale: Hurts little more Pain Location: neck where  PICC was removed Pain Descriptors / Indicators:  (stings) Pain Intervention(s): Patient requesting pain meds-RN notified     Hand Dominance Right   Extremity/Trunk Assessment Upper Extremity Assessment Upper Extremity Assessment: Overall WFL for tasks assessed (Pt performs a daily theraband exercise program at SNF)   Lower Extremity Assessment Lower Extremity Assessment: Defer to PT evaluation   Cervical / Trunk Assessment Cervical / Trunk Assessment: Normal   Communication Communication Communication: No difficulties   Cognition Arousal/Alertness: Awake/alert Behavior During Therapy: WFL for tasks assessed/performed Overall Cognitive Status: Within Functional Limits for tasks assessed                     General Comments       Exercises       Shoulder Instructions      Home Living Family/patient expects to be discharged to:: Skilled nursing facility                                 Additional Comments: [facility]      Prior Functioning/Environment Level of Independence: Needs assistance  Gait / Transfers Assistance Needed: able to transfer with sliding board, working on  SPT at rehab ADL's / Homemaking Assistance Needed: Pt is assisted for bathing, dressing and toileting, can self feed and groom in sitting.        OT Diagnosis: Generalized weakness;Acute pain   OT Problem List:     OT Treatment/Interventions:      OT Goals(Current goals can be found in the care plan section) Acute Rehab OT Goals Patient Stated Goal: to get back home soon  OT Frequency:     Barriers to D/C:            Co-evaluation              End of Session Nurse Communication: Patient requests pain meds  Activity Tolerance: Patient tolerated treatment well Patient left: in bed;with call bell/phone within reach;with family/visitor present   Time: 1137-1150 OT Time Calculation (min): 13 min Charges:  OT General Charges $OT Visit: 1  Procedure OT Evaluation $Initial OT Evaluation Tier I: 1 Procedure G-Codes:    Evern Bio 06/27/2014, 12:00 PM [phone]

## 2014-06-27 NOTE — Clinical Documentation Improvement (Signed)
     MD's, NP'S, and PA's  Noted "left plantar heel ulcer" documented, please provide clinical cause of ulcer and/ or stage of ulcer, if known.  Thank you  Possible Clinical Conditions?   Skin ulcer Varicose ulcer  Non -pressure chronic ulcer  Stage  I  Pressure Ulcer   (reddening of the skin) Stage  II Pressure Ulcer  (blister open or unopened) Stage  III Pressure Ulcer (through all layers skin) Stage IV Pressure Ulcer   (through skin & underlying  muscle, tendons, and bones) Other Condition Cannot Clinically Determine   Treatment: surgical debridement   Thank You, Lavonda Jumbo ,RN Clinical Documentation Specialist:  [phone]  [facility]- Health Information Management

## 2014-06-27 NOTE — Clinical Social Work Note (Signed)
Patient from [facility] and will return there at discharge. [employer] authorization pending per [facility] admissions staff. CSW talked with patient late afternoon to inform him of need for authorization prior to discharge. Full assessment to follow.  Genelle Bal, MSW, LCSW Licensed Clinical Social Worker Clinical Social Work Department [employer] [phone]

## 2014-06-28 ENCOUNTER — Telehealth: Payer: Self-pay | Admitting: *Deleted

## 2014-06-28 LAB — RENAL FUNCTION PANEL
ANION GAP: 10 (ref 5–15)
Albumin: 1.8 g/dL — ABNORMAL LOW (ref 3.5–5.0)
BUN: 20 mg/dL (ref 6–20)
CO2: 25 mmol/L (ref 22–32)
Calcium: 8.5 mg/dL — ABNORMAL LOW (ref 8.9–10.3)
Chloride: 100 mmol/L — ABNORMAL LOW (ref 101–111)
Creatinine, Ser: 4.98 mg/dL — ABNORMAL HIGH (ref 0.61–1.24)
GFR calc Af Amer: 14 mL/min — ABNORMAL LOW (ref >60)
GFR, EST NON AFRICAN AMERICAN: 12 mL/min — AB (ref >60)
Glucose, Bld: 75 mg/dL (ref 65–99)
PHOSPHORUS: 4.1 mg/dL (ref 2.5–4.6)
Potassium: 4.1 mmol/L (ref 3.5–5.1)
SODIUM: 135 mmol/L (ref 135–145)

## 2014-06-28 LAB — VANCOMYCIN, RANDOM: Vancomycin Rm: 14 ug/mL

## 2014-06-28 LAB — CBC
HEMATOCRIT: 26.4 % — AB (ref 39.0–52.0)
HEMOGLOBIN: 8 g/dL — AB (ref 13.0–17.0)
MCH: 29.7 pg (ref 26.0–34.0)
MCHC: 30.3 g/dL (ref 30.0–36.0)
MCV: 98.1 fL (ref 78.0–100.0)
Platelets: 146 10*3/uL — ABNORMAL LOW (ref 150–400)
RBC: 2.69 MIL/uL — ABNORMAL LOW (ref 4.22–5.81)
RDW: 19.3 % — ABNORMAL HIGH (ref 11.5–15.5)
WBC: 10.8 10*3/uL — ABNORMAL HIGH (ref 4.0–10.5)

## 2014-06-28 MED ORDER — SODIUM CHLORIDE 0.9 % IV SOLN: 100.0000 mL | INTRAVENOUS | Status: DC | PRN

## 2014-06-28 MED ORDER — VANCOMYCIN HCL 1000 MG IV SOLR
1500.0000 mg | INTRAVENOUS | Status: AC
  Administered 2014-06-28: 1500 mg via INTRAVENOUS
  Filled 2014-06-28 (×2): qty 1500

## 2014-06-28 MED ORDER — ALTEPLASE 2 MG IJ SOLR
2.0000 mg | Freq: Once | INTRAMUSCULAR | Status: DC | PRN
  Filled 2014-06-28: qty 2

## 2014-06-28 MED ORDER — HEPARIN SODIUM (PORCINE) 1000 UNIT/ML DIALYSIS: 1000.0000 [IU] | INTRAMUSCULAR | Status: DC | PRN

## 2014-06-28 MED ORDER — LIDOCAINE HCL (PF) 1 % IJ SOLN: 5.0000 mL | INTRAMUSCULAR | Status: DC | PRN

## 2014-06-28 MED ORDER — VANCOMYCIN HCL 1000 MG IV SOLR: 1250.0000 mg | INTRAVENOUS | Status: DC

## 2014-06-28 MED ORDER — PENTAFLUOROPROP-TETRAFLUOROETH EX AERO: 1.0000 "application " | INHALATION_SPRAY | CUTANEOUS | Status: DC | PRN

## 2014-06-28 MED ORDER — NEPRO/CARBSTEADY PO LIQD
237.0000 mL | ORAL | Status: DC | PRN
  Filled 2014-06-28: qty 237

## 2014-06-28 MED ORDER — LIDOCAINE-PRILOCAINE 2.5-2.5 % EX CREA
1.0000 "application " | TOPICAL_CREAM | CUTANEOUS | Status: DC | PRN
  Filled 2014-06-28: qty 5

## 2014-06-28 NOTE — Progress Notes (Signed)
Pt left floor via stetcher by PTAR.

## 2014-06-28 NOTE — Progress Notes (Signed)
Pt seen and examined, no changes from DC summary dictated by Dr.Tat, underwent hemodialysis today and subsequently had dressing change by RN Remains stable for discharge to SNF  Zannie Cove, MD

## 2014-06-28 NOTE — Clinical Social Work Note (Signed)
Patient will discharge back to [facility] today with a Letter of Guarantee, pending [employer] approval. Facility provided with discharge paperwork and [name] will be transported to facility by ambulance.   Genelle Bal, MSW, LCSW Licensed Clinical Social Worker Clinical Social Work Department [employer] [phone]

## 2014-06-28 NOTE — Telephone Encounter (Signed)
Obtained release of Information for records for March 2016 office visit notes.  Faxed notes to insurance company.

## 2014-06-28 NOTE — Progress Notes (Signed)
Pt in Dialysis.

## 2014-06-28 NOTE — Progress Notes (Signed)
Called report to Elaine at [facility].

## 2014-06-28 NOTE — Progress Notes (Signed)
Hemodialysis- Tx complete without issue. Goal met. Given 1.5g dose vancomycin per pharmacy today. Report called to 6E.

## 2014-06-28 NOTE — Progress Notes (Signed)
Subjective:  stable  Objective Vital signs in last 24 hours: Filed Vitals:   06/28/14 0810 06/28/14 0828 06/28/14 0855 06/28/14 0927  BP: 123/76 117/69 116/69 116/57  Pulse: 76 79 74 78  Temp:      TempSrc:      Resp:      Height:      Weight:      SpO2:       Weight change:  Physical Exam General: alert , Nad, Ox3  Heart: RRR, no rub, mur or gallop Lungs: CTA, unlabored breathing  Abdomen: soft, nontender . nondistended +BS  Extremities:Biat lower . ACE wrap intact/noted upper legs/below patella  Edema bilat. Dialysis Access: L Upper arm  AVF +b/t maturing. L IJ perm  cath  Dialysis Orders: MWF south 4hr 30 mins 103.5kgs 2K/2.5Ca 400/800 3000u heparin hectorol 1. No ESA or FE   Problem: 1. LLE laceration s/p fall- I&D 5/27  ortho- changed dressing yesterday/fu with ortho as they direct. 2. L distal femur osteo- s/p I&D w beads 5/12. outpt culture + for MRSA, inpt + Proteus, remained on vanc and fortaz at outpt center at DC- cont antibiotics- plan through 07/24/14  3. ESRD - MWF HD 4. Hypotension - on admission, AI + BP meds, better now, s/p stress steroids and coreg/ norvasc/ hydral are on hold, has some lower extrem edema, up 3-4 kg  5. Anemia - started Aranesp. No heparin in HD. Transfuse PRN. Hb 8 6. Metabolic bone disease - Ca correct 10.1 /phos 4.1 / PTH 474, holding hectoro/  Hold phoslo. 2 Ca bath  7. Nutrition - alb1.8 2. Renal diet, viamin and nepro 8. Dispo-possible DC soon back to SNF  Plan -  HD today, UF 3-4 kg to dry wt  Vinson Moselle MD pager [phone]    cell [phone] 06/28/2014, 9:47 AM    Labs: Basic Metabolic Panel:  Recent Labs Lab 06/22/14 1354 06/23/14 0839 06/23/14 1429 06/26/14 1030  NA 134* 136 133* 133*  K 3.9 4.5 4.2 3.7  CL 97* 97* 98* 97*  CO2 25  --  24 23  GLUCOSE 92 137* 117* 120*  BUN 14 27* 26* 28*  CREATININE 3.85* 4.60* 5.15* 5.69*  CALCIUM 9.2  --  8.9 8.3*  PHOS  --   --  4.6 4.1   Liver  Function Tests:  Recent Labs Lab 06/23/14 1429 06/26/14 1030  ALBUMIN 2.0* 1.8*   CBC:  Recent Labs Lab 06/22/14 1354  06/23/14 1410 06/24/14 1125 06/25/14 0455 06/26/14 1000  WBC 18.3*  --  18.4* 15.2* 14.6* 11.8*  NEUTROABS 13.5*  --   --   --   --   --   HGB 9.9*  < > 8.2* 8.0* 7.5* 8.0*  HCT 31.7*  < > 26.7* 26.0* 25.0* 26.0*  MCV 97.5  --  96.7 99.6 99.2 96.7  PLT 133*  --  143* 97* 105* 114*  < > = values in this interval not displayed. Cardiac Enzymes: No results for input(s): CKTOTAL, CKMB, CKMBINDEX, TROPONINI in the last 168 hours. CBG: No results for input(s): GLUCAP in the last 168 hours.  Studies/Results: No results found. Medications:   . amiodarone  100 mg Oral Daily  . ceftAZIDime (FORTAZ) IVPB 2 gram/50 mL D5W (Pyxis)  2 g Intravenous Q M,W,F-HD  . cholecalciferol  5,000 Units Oral Daily  . darbepoetin (ARANESP) injection - DIALYSIS  150 mcg Intravenous Q Mon-HD  . heparin  5,000 Units Subcutaneous 3 times per day  .  multivitamin  1 tablet Oral QHS  . pravastatin  40 mg Oral q1800  . predniSONE  5 mg Oral Q breakfast  . saccharomyces boulardii  250 mg Oral BID  . sodium chloride  10-40 mL Intracatheter Q12H  . sodium chloride  3 mL Intravenous Q12H  . vancomycin  1,000 mg Intravenous Q M,W,F-HD

## 2014-06-28 NOTE — Progress Notes (Signed)
ANTIBIOTIC CONSULT NOTE - Follow-up  Pharmacy Consult for vancomycin and ceftazidime Indication: osteomyelitis and wound infection  No Known Allergies  Patient Measurements: Height: 6' (182.9 cm) Weight: 236 lb 5.3 oz (107.2 kg) IBW/kg (Calculated) : 77.6   Vital Signs: Temp: 98 F (36.7 C) (06/01 0805) Temp Source: Oral (06/01 0805) BP: 116/57 mmHg (06/01 0927) Pulse Rate: 78 (06/01 0927) Intake/Output from previous day: 05/31 0701 - 06/01 0700 In: 720 [P.O.:720] Out: 100 [Urine:100] Intake/Output from this shift:    Labs:  Recent Labs  06/26/14 1000 06/26/14 1030  WBC 11.8*  --   HGB 8.0*  --   PLT 114*  --   CREATININE  --  5.69*   Estimated Creatinine Clearance: 18.1 mL/min (by C-G formula based on Cr of 5.69).  Recent Labs  06/28/14 0827  [facility] 14     Microbiology: Recent Results (from the past 720 hour(s))  Anaerobic culture     Status: None   Collection Time: 06/08/14 11:55 AM  Result Value Ref Range Status   Specimen Description TISSUE THIGH LEFT  Final   Special Requests NONE  Final   Gram Stain   Final    ABUNDANT WBC PRESENT,BOTH PMN AND MONONUCLEAR ABUNDANT GRAM POSITIVE COCCI IN CLUSTERS IN PAIRS Performed at [facility] Gram Stain Report Called to,Read Back By and Verified With: Gram Stain Report Called to,Read Back By and Verified With: Mendel Corning RN 1320 06/08/14 [place] WILSONM Performed at [facility]    Culture   Final    NO ANAEROBES ISOLATED Performed at [facility]    Report Status 06/13/2014 FINAL  Final  Gram stain     Status: None   Collection Time: 06/08/14 11:55 AM  Result Value Ref Range Status   Specimen Description TISSUE THIGH LEFT  Final   Special Requests NONE  Final   Gram Stain   Final    ABUNDANT WBC PRESENT,BOTH PMN AND MONONUCLEAR ABUNDANT GRAM POSITIVE COCCI IN PAIRS IN CLUSTERS Gram Stain Report Called to,Read Back By and Verified With: Mendel Corning RN 13:20 06/08/14 ([email or web address])     Report Status 06/08/2014 FINAL  Final  Tissue culture     Status: None   Collection Time: 06/08/14 11:55 AM  Result Value Ref Range Status   Specimen Description TISSUE THIGH LEFT  Final   Special Requests NONE  Final   Gram Stain   Final    ABUNDANT WBC PRESENT,BOTH PMN AND MONONUCLEAR ABUNDANT GRAM POSITIVE COCCI IN CLUSTERS IN PAIRS Performed at [facility] Gram Stain Report Called to,Read Back By and Verified With: Gram Stain Report Called to,Read Back By and Verified With: Mendel Corning RN 1320 06/08/14 [place] WILSONM Performed at [facility]    Culture   Final    MODERATE PROTEUS MIRABILIS Performed at [facility]    Report Status 06/11/2014 FINAL  Final   Organism ID, Bacteria PROTEUS MIRABILIS  Final      Susceptibility   Proteus mirabilis - MIC*    AMPICILLIN >=32 RESISTANT Resistant     AMPICILLIN/SULBACTAM >=32 RESISTANT Resistant     CEFAZOLIN >=64 RESISTANT Resistant     CEFEPIME <=1 SENSITIVE Sensitive     CEFTAZIDIME 4 SENSITIVE Sensitive     CEFTRIAXONE 4 SENSITIVE Sensitive     CIPROFLOXACIN <=0.25 SENSITIVE Sensitive     GENTAMICIN <=1 SENSITIVE Sensitive     IMIPENEM 2 SENSITIVE Sensitive     PIP/TAZO <=4 SENSITIVE Sensitive  TOBRAMYCIN <=1 SENSITIVE Sensitive     TRIMETH/SULFA <=20 SENSITIVE Sensitive     * MODERATE PROTEUS MIRABILIS  Anaerobic culture     Status: None   Collection Time: 06/08/14  1:58 PM  Result Value Ref Range Status   Specimen Description TISSUE  Final   Special Requests REAMINGS OF LEFT FEMUR PT ON ANCEF AND ZOSYN  Final   Gram Stain   Final    ABUNDANT WBC PRESENT,BOTH PMN AND MONONUCLEAR NO ORGANISMS SEEN Performed at [facility] Performed at [facility]    Culture   Final    NO ANAEROBES ISOLATED Performed at [facility]    Report Status 06/13/2014 FINAL  Final  Gram stain     Status: None   Collection Time: 06/08/14  1:58 PM  Result Value Ref Range Status   Specimen  Description TISSUE  Final   Special Requests REAMINGS OF LEFT FEMUR  Final   Gram Stain   Final    ABUNDANT WBC PRESENT,BOTH PMN AND MONONUCLEAR NO ORGANISMS SEEN    Report Status 06/08/2014 FINAL  Final  Tissue culture     Status: None   Collection Time: 06/08/14  1:58 PM  Result Value Ref Range Status   Specimen Description TISSUE  Final   Special Requests REAMINGS OF LEFT FEMUR  Final   Gram Stain   Final    ABUNDANT WBC PRESENT,BOTH PMN AND MONONUCLEAR NO ORGANISMS SEEN Performed at [facility] Performed at [facility]    Culture   Final    NO GROWTH 3 DAYS Performed at [facility]    Report Status 06/11/2014 FINAL  Final  MRSA PCR Screening     Status: Abnormal   Collection Time: 06/08/14  6:15 PM  Result Value Ref Range Status   MRSA by PCR POSITIVE (A) NEGATIVE Final    Comment:        The GeneXpert MRSA Assay (FDA approved for NASAL specimens only), is one component of a comprehensive MRSA colonization surveillance program. It is not intended to diagnose MRSA infection nor to guide or monitor treatment for MRSA infections. RESULT CALLED TO, READ BACK BY AND VERIFIED WITH: RN,GREG HARDUK [id number] @2121  THANEY   Culture, blood (routine x 2)     Status: None   Collection Time: 06/08/14  7:08 PM  Result Value Ref Range Status   Specimen Description BLOOD RIGHT ANTECUBITAL  Final   Special Requests   Final    BOTTLES DRAWN AEROBIC AND ANAEROBIC 5CCBLUE 3CC RED   Culture   Final    NO GROWTH 5 DAYS Note: Culture results may be compromised due to an inadequate volume of blood received in culture bottles. Performed at [facility]    Report Status 06/15/2014 FINAL  Final  Culture, blood (routine x 2)     Status: None   Collection Time: 06/08/14  7:20 PM  Result Value Ref Range Status   Specimen Description BLOOD RIGHT HAND  Final   Special Requests BOTTLES DRAWN AEROBIC ONLY  Final   Culture   Final    NO GROWTH 5  DAYS Performed at [facility]    Report Status 06/15/2014 FINAL  Final  Urine culture     Status: None   Collection Time: 06/09/14  7:03 AM  Result Value Ref Range Status   Specimen Description URINE, RANDOM  Final   Special Requests NONE  Final   Colony Count NO GROWTH Performed at  [facility]   Final   Culture NO GROWTH Performed at [facility]   Final   Report Status 06/10/2014 FINAL  Final  Culture, blood (routine x 2)     Status: None (Preliminary result)   Collection Time: 06/22/14  4:17 PM  Result Value Ref Range Status   Specimen Description BLOOD RIGHT HAND  Final   Special Requests BOTTLES DRAWN AEROBIC ONLY 3CC  Final   Culture   Final           BLOOD CULTURE RECEIVED NO GROWTH TO DATE CULTURE WILL BE HELD FOR 5 DAYS BEFORE ISSUING A FINAL NEGATIVE REPORT Performed at [facility]    Report Status PENDING  Incomplete  Culture, blood (routine x 2)     Status: None (Preliminary result)   Collection Time: 06/22/14  4:28 PM  Result Value Ref Range Status   Specimen Description BLOOD RIGHT HAND  Final   Special Requests BOTTLES DRAWN AEROBIC ONLY 2CC  Final   Culture   Final           BLOOD CULTURE RECEIVED NO GROWTH TO DATE CULTURE WILL BE HELD FOR 5 DAYS BEFORE ISSUING A FINAL NEGATIVE REPORT Note: Culture results may be compromised due to an inadequate volume of blood received in culture bottles. Performed at [facility]    Report Status PENDING  Incomplete    Assessment: 35 YOM with ESRD on HD MWF who was recently admitted 5/12-5/23 for osteomyelitis of L femur. He fell and now has a left lower leg laceration and went to the OR for I&D.  He was discharged on vancomycin and ceftazidime with HD  Zosyn 5/12 x 1 dose  Merrem 5/12>> 5/15 CTX 5/15>>5/18 Vanc 5/12>> 5/14; 5/18>> Ceftazidime 5/18>>   5/11 wound cx (picture in 5/23 d/c summary): p.mirabilis, MRSA 5/26 BCX: ngtd  Random vanc level before dialysis today  was 14 mcg/mL  Goal of Therapy:  pre-HD vancomycin level 15-68mcg/mL  Plan:  - Give 1 time 1500 mg vanc today with dialysis.  - Increase vanc to 1250 mg with dialysis starting Friday  - Ceftaz 2g IV qMWF after HD  - F/u HD schedule and tolerability  Isaac Bliss, PharmD, BCPS Clinical Pharmacist Pager [phone] 06/28/2014 10:02 AM

## 2014-06-28 NOTE — Progress Notes (Signed)
PT Cancellation Note  Patient Details Name: Chad [name] MRN: [id number] DOB: August 11, 1955   Cancelled Treatment:    Reason Eval/Treat Not Completed: Other (comment) (Pt currently in HD.)   Will follow up later today as time allows;  Otherwise, will follow up for PT tomorrow;   Thank you,  Van Clines, PT  Acute Rehabilitation Services Pager [phone] Office [phone]     Van Clines [facility] 06/28/2014, 8:31 AM

## 2014-06-29 LAB — CULTURE, BLOOD (ROUTINE X 2)
CULTURE: NO GROWTH
Culture: NO GROWTH

## 2014-07-14 ENCOUNTER — Encounter: Payer: Self-pay | Admitting: Vascular Surgery

## 2014-07-18 ENCOUNTER — Ambulatory Visit (INDEPENDENT_AMBULATORY_CARE_PROVIDER_SITE_OTHER): Payer: Self-pay | Admitting: Vascular Surgery

## 2014-07-18 ENCOUNTER — Ambulatory Visit (HOSPITAL_COMMUNITY)
Admission: RE | Admit: 2014-07-18 | Discharge: 2014-07-18 | Disposition: A | Discharge status: Home / Self Care | Payer: Medicare HMO | Source: Ambulatory Visit | Attending: Vascular Surgery | Admitting: Vascular Surgery

## 2014-07-18 ENCOUNTER — Encounter: Payer: Self-pay | Admitting: Vascular Surgery

## 2014-07-18 VITALS — BP 106/68 | HR 78 | Temp 98.2°F | Resp 16 | Ht 73.0 in | Wt 232.0 lb

## 2014-07-18 DIAGNOSIS — N186 End stage renal disease: Secondary | ICD-10-CM | POA: Diagnosis not present

## 2014-07-18 DIAGNOSIS — Z4931 Encounter for adequacy testing for hemodialysis: Secondary | ICD-10-CM | POA: Diagnosis not present

## 2014-07-18 NOTE — Progress Notes (Signed)
    Postoperative Access Visit   History of Present Illness  Chad [name] is a 59 y.o. year old male who presents for postoperative follow-up for: left brachiocephalic AV fistula (Date: 06/01/14).  The patient's wounds are  healed.  The patient notes no steal symptoms.  The patient is able to complete their activities of daily living.  He is currently dialyzing MWF via a right IJ tunneled dialysis catheter. He dialyzes at [facility].    Physical Examination Filed Vitals:   07/18/14 1351  BP: 106/68  Pulse: 78  Temp: 98.2 F (36.8 C)  Resp: 16    LUE: Incision is well healed, skin feels warm, hand grip is 5/5, sensation in digits is  intact, palpable thrill, bruit can  be auscultated   Access duplex 07/18/14 Diameters 0.44 cm at distal vein increasing to 0.67 cm at mid upper arm and 0.67 at proximal upper arm. Depths: 0.42 distally, 1.08 mid upper arm, 0.39 proximal upper arm. Competing branch at mid upper arm.   Medical Decision Making  Chad [name] is a 59 y.o. year old male who presents s/p left brachiocephalic AVF. His access is maturing well but not ready for use until 09/01/14, three months from surgery date. The patient's tunneled dialysis catheter can be removed after two successful cannulations and completed dialysis treatments.   Maris Berger, PA-C [facility] Office: [phone]   07/18/2014, 2:14 PM  This patient was seen and examined in conjunction with Dr. Hart Rochester.

## 2014-09-13 ENCOUNTER — Other Ambulatory Visit: Payer: Self-pay | Admitting: *Deleted

## 2014-09-13 DIAGNOSIS — T82510S Breakdown (mechanical) of surgically created arteriovenous fistula, sequela: Secondary | ICD-10-CM

## 2014-09-19 ENCOUNTER — Ambulatory Visit: Payer: Self-pay | Admitting: Vascular Surgery

## 2014-09-19 ENCOUNTER — Other Ambulatory Visit (HOSPITAL_COMMUNITY): Payer: Self-pay | Admitting: Internal Medicine

## 2014-09-19 ENCOUNTER — Encounter (HOSPITAL_COMMUNITY): Payer: Self-pay

## 2014-10-11 ENCOUNTER — Ambulatory Visit: Payer: Self-pay | Admitting: Vascular Surgery

## 2014-10-11 ENCOUNTER — Encounter (HOSPITAL_COMMUNITY): Payer: Self-pay

## 2014-10-16 ENCOUNTER — Encounter: Payer: Self-pay | Admitting: Vascular Surgery

## 2014-10-17 ENCOUNTER — Encounter (HOSPITAL_COMMUNITY): Payer: Self-pay

## 2014-10-17 ENCOUNTER — Ambulatory Visit: Payer: Self-pay | Admitting: Vascular Surgery

## 2014-10-18 ENCOUNTER — Other Ambulatory Visit: Payer: Self-pay | Admitting: Orthopedic Surgery

## 2014-10-18 DIAGNOSIS — R52 Pain, unspecified: Secondary | ICD-10-CM

## 2014-10-18 DIAGNOSIS — S72462K Displaced supracondylar fracture with intracondylar extension of lower end of left femur, subsequent encounter for closed fracture with nonunion: Secondary | ICD-10-CM

## 2014-10-23 ENCOUNTER — Other Ambulatory Visit: Payer: Self-pay

## 2014-10-24 ENCOUNTER — Ambulatory Visit
Admission: RE | Admit: 2014-10-24 | Discharge: 2014-10-24 | Disposition: A | Discharge status: Home / Self Care | Payer: Medicare HMO | Source: Ambulatory Visit | Attending: Orthopedic Surgery | Admitting: Orthopedic Surgery

## 2014-10-24 DIAGNOSIS — R52 Pain, unspecified: Secondary | ICD-10-CM

## 2014-10-24 DIAGNOSIS — S72462K Displaced supracondylar fracture with intracondylar extension of lower end of left femur, subsequent encounter for closed fracture with nonunion: Secondary | ICD-10-CM

## 2014-11-16 ENCOUNTER — Encounter (HOSPITAL_COMMUNITY): Payer: Self-pay

## 2014-11-21 ENCOUNTER — Ambulatory Visit: Payer: Self-pay | Admitting: Vascular Surgery

## 2014-12-12 ENCOUNTER — Other Ambulatory Visit (HOSPITAL_COMMUNITY): Payer: Self-pay | Admitting: Internal Medicine

## 2014-12-12 ENCOUNTER — Other Ambulatory Visit: Payer: Self-pay | Admitting: Cardiology

## 2014-12-28 ENCOUNTER — Encounter: Payer: Self-pay | Admitting: Internal Medicine

## 2014-12-29 ENCOUNTER — Encounter: Payer: Self-pay | Admitting: Vascular Surgery

## 2015-01-02 ENCOUNTER — Encounter (HOSPITAL_COMMUNITY): Payer: Self-pay

## 2015-01-02 ENCOUNTER — Ambulatory Visit: Payer: Self-pay | Admitting: Vascular Surgery

## 2015-02-15 ENCOUNTER — Other Ambulatory Visit (HOSPITAL_COMMUNITY): Payer: Self-pay | Admitting: Internal Medicine

## 2015-04-07 ENCOUNTER — Other Ambulatory Visit (HOSPITAL_COMMUNITY): Payer: Self-pay | Admitting: Internal Medicine

## 2015-07-10 ENCOUNTER — Other Ambulatory Visit (HOSPITAL_COMMUNITY): Payer: Self-pay | Admitting: Internal Medicine

## 2015-07-16 ENCOUNTER — Other Ambulatory Visit (HOSPITAL_COMMUNITY): Payer: Self-pay | Admitting: *Deleted

## 2015-07-16 MED ORDER — ALLOPURINOL 100 MG PO TABS: 100.0000 mg | ORAL_TABLET | Freq: Two times a day (BID) | ORAL | Status: AC

## 2015-07-16 MED ORDER — CARVEDILOL 3.125 MG PO TABS: 3.1250 mg | ORAL_TABLET | Freq: Two times a day (BID) | ORAL | Status: DC

## 2015-08-07 ENCOUNTER — Other Ambulatory Visit: Payer: Self-pay | Admitting: Cardiology

## 2015-08-07 NOTE — Telephone Encounter (Signed)
This patient is followed in the [facility] Failure [facility] 

## 2015-08-23 ENCOUNTER — Other Ambulatory Visit (HOSPITAL_COMMUNITY): Payer: Self-pay | Admitting: Internal Medicine

## 2015-09-11 ENCOUNTER — Other Ambulatory Visit (HOSPITAL_COMMUNITY): Payer: Self-pay | Admitting: Internal Medicine

## 2015-10-15 ENCOUNTER — Other Ambulatory Visit (HOSPITAL_COMMUNITY): Payer: Self-pay | Admitting: Internal Medicine

## 2015-10-25 ENCOUNTER — Other Ambulatory Visit (HOSPITAL_COMMUNITY): Payer: Self-pay | Admitting: *Deleted

## 2015-10-25 MED ORDER — ATORVASTATIN CALCIUM 40 MG PO TABS: 40.0000 mg | ORAL_TABLET | Freq: Every day | ORAL | 3 refills | Status: AC

## 2015-11-27 ENCOUNTER — Ambulatory Visit (INDEPENDENT_AMBULATORY_CARE_PROVIDER_SITE_OTHER): Payer: Medicare HMO | Admitting: Physician Assistant

## 2015-11-27 ENCOUNTER — Encounter: Payer: Self-pay | Admitting: Physician Assistant

## 2015-11-27 VITALS — BP 124/76 | HR 82 | Ht 73.0 in | Wt 214.0 lb

## 2015-11-27 DIAGNOSIS — I4892 Unspecified atrial flutter: Secondary | ICD-10-CM

## 2015-11-27 DIAGNOSIS — I420 Dilated cardiomyopathy: Secondary | ICD-10-CM

## 2015-11-27 DIAGNOSIS — Z79899 Other long term (current) drug therapy: Secondary | ICD-10-CM

## 2015-11-27 DIAGNOSIS — Q254 Congenital malformation of aorta unspecified: Secondary | ICD-10-CM

## 2015-11-27 DIAGNOSIS — Z992 Dependence on renal dialysis: Secondary | ICD-10-CM

## 2015-11-27 DIAGNOSIS — I5032 Chronic diastolic (congestive) heart failure: Secondary | ICD-10-CM | POA: Diagnosis not present

## 2015-11-27 DIAGNOSIS — I10 Essential (primary) hypertension: Secondary | ICD-10-CM

## 2015-11-27 DIAGNOSIS — E7800 Pure hypercholesterolemia, unspecified: Secondary | ICD-10-CM

## 2015-11-27 DIAGNOSIS — N186 End stage renal disease: Secondary | ICD-10-CM

## 2015-11-27 DIAGNOSIS — I350 Nonrheumatic aortic (valve) stenosis: Secondary | ICD-10-CM | POA: Diagnosis not present

## 2015-11-27 NOTE — Patient Instructions (Addendum)
Medication Instructions:  DO NOT TAKE the Isosorbide Mononitrate (Imdur)  **I want you to check with your kidney doctor to see if it is ok for you to take** Lisinopril 2.5 mg Once daily.  I have given you a written prescription to show the kidney doctor.  Do not fill it unless he/she says that it is ok to take.  Labwork: Get LFTs, TSH drawn at dialysis and have the results faxed to [facility], PA-C [phone]).  Testing/Procedures: Your physician has requested that you have an echocardiogram. Echocardiography is a painless test that uses sound waves to create images of your heart. It provides your doctor with information about the size and shape of your heart and how well your heart's chambers and valves are working. This procedure takes approximately one hour. There are no restrictions for this procedure.   Follow-Up: Dr. Marca Ancona in 1 month in the [facility].  Any Other Special Instructions Will Be Listed Below (If Applicable).  If you need a refill on your cardiac medications before your next appointment, please call your pharmacy.

## 2015-11-27 NOTE — Progress Notes (Signed)
Cardiology Office Note:    Date:  11/27/2015   ID:  Chad BargesKenneth F [name], DOB 02/29/1955, MRN [id number]  PCP:  Chad [name]  Cardiologist:  Dr. Marca Anconaalton [name]   Electrophysiologist:  N/a Nephrology: Dr. Arrie [name]  Referring [name]: Chad [name]   Chief Complaint  Patient presents with  . Follow-up    CHF    History of Present Illness:    Chad [name] is a 60 y.o. male with a hx of renal transplant in 1996, CKD, HCV, paroxysmal atrial flutter, chronic systolic CHF. In 5/13, he developed parainfluenza pneumonia c/b by ARDS and septic shock.  He developed atrial flutter with RVR but converted to NSR spontaneously. He has been treated with Amiodarone and is not on anticoagulation due to prior GI bleeding.  EF was 15-20%. He was DC'd to inpatient rehabilitation but readmitted in 7/13 with lower leg wound infection which grew out Proteus and Serratia. He was again admitted in 9/13 with a/c systolic CHF. Repeat echo demonstrated EF 30-35. Myoview demonstrated no ischemia or infarction, EF 42. In 6/14, he fell and suffered a distal femur fracture treated with ORIF. This was c/b wound infection which grew out Proteus and Enterococcus. He was last seen by Dr. Shirlee LatchMcLean in this clinic in 1/15.  He was followed in the CHF clinic for several months and was last seen there in 8/15.  Echo in 5/15 demonstrated EF 60 with mild ASO mild to moderate AI.  Since he has seen Dr. Shirlee LatchMcLean, he has had failure of his transplant with progression to ESRD and is now on dialysis every Monday, Wednesday, Friday.  In 5/16, he underwent irrigation and debridement secondary to purulent drainage from his previous leg wound. He developed hypotension requiring vasopressors.  It was felt he was likely septic from his leg wound/osteomyelitis.    After recovery, he was discharged to rehabilitation had a fall and open up his wound. The wound had to be closed in the OR.  He returns for follow-up. He is here alone today. He arrives  in a wheelchair. He relies upon Chad [name] transportation to get around. He has had significant confusion regarding his medications. He recently picked up isosorbide from the pharmacy. Initially, he noted that he was not taking this medication since last year. However, after I further interviewed him, he noted he was taking a medication which began with the letter "I" that he ran out of about 2 weeks ago. He has had difficulty with low blood pressures during dialysis. His dry weight has been adjusted. He denies chest discomfort. He denies dyspnea. He denies orthopnea, PND, syncope. He does note some LE edema is overall improved.  Prior CV studies that were reviewed today include:    Echo 5/15 EF 60, normal wall motion, mild aortic stenosis, mild to moderate AI, density noted in aortic arch, mildly reduced RVSF, mild RAE, PASP 35  Echo 2/15 EF 55, moderate concentric LVH, grade 2 diastolic dysfunction, PASP 41, mild AI, mild AS  Echo 11/13 EF 35  Echo 9/13  EF 30-35  Myoview 9/13 IMPRESSION: 1.  No scintigraphic evidence of prior infarction or pharmacologically induced ischemia. 2.  Mildly dilated left ventricle with mild global hypokinesia. Ejection fraction - 42%.  Past Medical History:  Diagnosis Date  . Atrial flutter (HCC)    takes Amiodarone daily  . CKD (chronic kidney disease), stage IV (HCC)    a. s/p renal transplant 1996.  Marland Kitchen. GERD (gastroesophageal reflux disease)    takes Protonix  daily  . Gout    takes Allopurinol daily  . Hepatitis C 1996   Hep C  . History of bronchitis    several yrs ago  . History of colon polyps   . History of kidney stones   . History of shingles   . HLD (hyperlipidemia)    takes Pravastatin daily  . Hyperparathyroidism due to renal insufficiency (HCC)   . Hypertension    takes Amlodipine,Hydralazine,Imdur,and Coreg daily  . Joint pain    right shoulder and left knee  . Nonischemic cardiomyopathy (HCC)    a. unknown etiology, EF 30-35%  by echo 09/2011;  b. 09/2011 Lexi MV EF 42%, no ischemia/infarct.  . Pneumonia 2014  . Sciatica   . Shortness of breath dyspnea    exertion  . Sleep apnea    does not use cpap  . Systolic CHF (HCC)    takes Lasix daily  . Urinary frequency   . Wound of left leg   1. Atrial flutter in setting of septic shock/PNA in 5/13.  2. CKD: Renal transplant 1996. Now baseline creatinine is around 3.  3. HTN 4. HCV 5. OSA: not currently on CPAP.  6. Parainfluenza PNA with ARDS and septic shock in 5/13.  He required CVVH briefly.  Recurrent PNA in 11/13.  7. Lower leg wound infection 7/13 with Proteus and Serratia.  8. Chronic systolic CHF: Echo (5/13) with EF 15-20%, diffuse hypokinesis.  Repeat echo in (9/13) with EF 30-35%, diffuse hypokinesis worse inferoposteriorly, mild to moderate MR with mild MS, mildly dilated RV with mildly decreased systolic function, PASP 73 mmHg.  Lexiscan myoview (9/13) with EF 42%, diffuse hypokinesis, no evidence for ischemia or infarction.  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  Echo (5/15) with EF 60%, mild AS, mild-moderate AI. 9. H/o GI bleed 10. Distal femur fracture with ORIF 6/14 complicated by wound infection.  11. Aortic valve disorders: Mild AS, mild-moderate AI.   Past Surgical History:  Procedure Laterality Date  . AV FISTULA PLACEMENT Left 06/01/2014   Procedure: LEFT BRACHIOCEPHALIC ARTERIOVENOUS (AV) FISTULA CREATION;  Surgeon: Chad Ochoa, [name];  Location: [facility] OR;  Service: Vascular;  Laterality: Left;  . colonsocopy    . FEMUR FRACTURE SURGERY Left 07/09/2012   Dr Chad [name]  . FEMUR IM NAIL Left 07/10/2012   ORIF L distal femur;  Surgeon: Chad Arthurs, [name];  Location: [facility] OR;  Service: Orthopedics;  Laterality: Left;  . HARDWARE REMOVAL Left 03/14/2014   Procedure: HARDWARE REMOVAL LEFT;  Surgeon: Chad Galas, [name];  Location: [facility] OR;  Service: Orthopedics;  Laterality: Left;  . HIP SURGERY Left    replacement  . I&D EXTREMITY Left  08/24/2012   Procedure: LEFT LEG IRRIGATION AND DEBRIDEMENT WITH POSSIBLE WOUND VAC APPLICATION;  Surgeon: Chad Arthurs, [name];  Location: [facility] OR;  Service: Orthopedics;  Laterality: Left;  . I&D EXTREMITY Left 06/23/2014   Procedure: IRRIGATION AND DEBRIDEMENT EXTREMITY;  Surgeon: Chad Galas, [name];  Location: [facility] OR;  Service: Orthopedics;  Laterality: Left;  . INCISION AND DRAINAGE Left 06/08/2014   Procedure: INCISION AND DRAINAGE OF LEFT THIGH;  Surgeon: Chad Galas, [name];  Location: [facility] OR;  Service: Orthopedics;  Laterality: Left;  . INSERTION OF DIALYSIS CATHETER  06/20/2011   Procedure: INSERTION OF DIALYSIS CATHETER;  Surgeon: [name], [name];  Location: [facility] OR;  Service: Vascular;  Laterality: Right;  Ultrasound guided insertion of right internal jugular dialysis catheter  . JOINT REPLACEMENT  left hip  . MULTIPLE EXTRACTIONS WITH ALVEOLOPLASTY  06/27/2011   Procedure: MULTIPLE EXTRACION WITH ALVEOLOPLASTY;  Surgeon: [name], DDS;  Location: [facility] OR;  Service: Oral Surgery;  Laterality: N/A;  Extraction  of tooth # 14 with alveoloplasty  . NEPHRECTOMY TRANSPLANTED ORGAN Right 98yrs ago  . NON UNION Left 03/14/2014   LEFT FEMER REPAIR     BY DR HANDY  . TRACHEOSTOMY  2014  . TRACHEOSTOMY CLOSURE  2014   2 wks after it was done  . WOUND DEBRIDEMENT Left 03/14/2014   Procedure: DEBRIDEMENT WOUND;  Surgeon: Chad Galas, [name];  Location: [facility] OR;  Service: Orthopedics;  Laterality: Left;  INSERTION OF  ANTIBIOTIC Beads  (2 strands: 15 beads and 14 beads)    Current Medications: Current Meds  Medication Sig  . allopurinol (ZYLOPRIM) 100 MG tablet Take 1 tablet (100 mg total) by mouth 2 (two) times daily.  Marland Kitchen amiodarone (PACERONE) 200 MG tablet TAKE 1 TABLET BY MOUTH EVERY DAY  . aspirin EC 81 MG tablet Take 81 mg by mouth daily.  Marland Kitchen atorvastatin (LIPITOR) 40 MG tablet Take 1 tablet (40 mg total) by mouth at bedtime.  . calcium carbonate, dosed in mg elemental calcium, 1250 (500 CA)  MG/5ML Take 15 mLs (1,500 mg of elemental calcium total) by mouth every 6 (six) hours as needed for indigestion.  . carvedilol (COREG) 3.125 MG tablet Take 1 tablet (3.125 mg total) by mouth 2 (two) times daily.  . cefTAZidime 2 g in dextrose 5 % 50 mL Inject 2 g into the vein every Monday, Wednesday, and Friday with hemodialysis.  Marland Kitchen doxercalciferol (HECTOROL) 4 MCG/2ML injection Inject 0.5 mLs (1 mcg total) into the vein every Monday, Wednesday, and Friday with hemodialysis.  Marland Kitchen predniSONE (DELTASONE) 10 MG tablet Take 10 mg by mouth daily with breakfast.  . saccharomyces boulardii (FLORASTOR) 250 MG capsule Take 1 capsule (250 mg total) by mouth 2 (two) times daily.  . sevelamer carbonate (RENVELA) 800 MG tablet Take 800 mg by mouth 3 (three) times daily with meals.  . vancomycin (VANCOCIN) 1 GM/200ML SOLN Inject 200 mLs (1,000 mg total) into the vein every Monday, Wednesday, and Friday with hemodialysis.     Allergies:   Review of patient's allergies indicates no known allergies.   Social History   Social History  . Marital status: Single    Spouse name: N/A  . Number of children: N/A  . Years of education: N/A   Social History Main Topics  . Smoking status: Never Smoker  . Smokeless tobacco: Never Used  . Alcohol use No  . Drug use: No  . Sexual activity: No   Other Topics Concern  . None   Social History Narrative  . None     Family History:  The patient's family history is not on file.   ROS:   Please see the history of present illness.    Review of Systems  Constitution: Positive for weight loss.   All other systems reviewed and are negative.   EKGs/Labs/Other Test Reviewed:    EKG:  EKG is  ordered today.  The ekg ordered today demonstrates NSR, HR 78, rightward axis, inferior Q waves, T-wave inversion in 3, aVF, V3-V5, QTc 483 ms  Recent Labs: No results found for requested labs within last 8760 hours.   Recent Lipid Panel    Component Value Date/Time    CHOL 202 (H) 02/16/2013 1448   TRIG 170 (H) 02/16/2013 1448   HDL  44 02/16/2013 1448   CHOLHDL 4.6 02/16/2013 1448   VLDL 34 02/16/2013 1448   LDLCALC 124 (H) 02/16/2013 1448   LDLDIRECT 152.6 10/22/2011 1300     Physical Exam:    VS:  BP 124/76 (BP Location: Right Arm)   Pulse 82   Ht 6\' 1"  (1.854 m)   Wt 214 lb (97.1 kg)   BMI 28.23 kg/m     Wt Readings from Last 3 Encounters:  11/27/15 214 lb (97.1 kg)  07/18/14 232 lb (105.2 kg)  06/28/14 231 lb 4.2 oz (104.9 kg)     Physical Exam  Constitutional: He is oriented to person, place, and time. He appears well-developed and well-nourished. No distress.  HENT:  Head: Normocephalic and atraumatic.  Eyes: No scleral icterus.  Neck: No JVD present.  Cardiovascular: S1 normal and S2 normal.   Murmur heard.  Medium-pitched harsh systolic murmur is present with a grade of 2/6  at the upper right sternal border Pulmonary/Chest: Effort normal. He has no wheezes. He has no rales.  Abdominal: Soft. There is no tenderness.  Musculoskeletal: He exhibits edema.  Trace-1+ bilateral (L>R) edema  Neurological: He is alert and oriented to person, place, and time.  Skin: Skin is warm and dry.  Psychiatric: He has a normal mood and affect.    ASSESSMENT:    1. Chronic diastolic congestive heart failure (HCC)   2. Dilated cardiomyopathy (HCC)   3. Atrial flutter, unspecified type (HCC)   4. Aortic valve stenosis, etiology of cardiac valve disease unspecified   5. ESRD on dialysis (HCC)   6. Essential hypertension   7. Abnormality of thoracic aorta   8. Pure hypercholesterolemia   9. On amiodarone therapy    PLAN:    In order of problems listed above:  1. Chronic (primarily) diastolic CHF - His EF was 30-35 at one point but improved to normal with EF 60 in 2015.  Mod diastolic dysfunction noted on echo in the past.  Volume management per dialysis.  His medications have been adjusted significantly with his hospitalizations over the  last year as well as since resuming dialysis. He is no longer on hydralazine. As best as I can tell, he has not taken isosorbide for the past 2 weeks. He does continue on Coreg but at a much lower dose.  Now that he is on dialysis, we may be able to put him on a low dose ACE if ok with Nephrology.  -  Arrange FU echo to recheck EF  -  Will give Rx for Lisinopril 2.5 QD. He can start if ok with Nephrology   -  Continue Coreg 3.125 bid   2. Dilated CM - Likely non-ischemic.  Low risk Myoview in 2013.  EF recovered to normal.  Repeat Echo as noted.  Will try to get him on an ACE if ok with Nephrology.  If not, consider resuming lower dose nitrates.  Continue beta-blocker.  3. Parox AFlutter - Maintaining NSR. Continue Amiodarone 200 QD.  -  Check FU LFTs, TSH    4. Aortic stenosis - Mild AS and mild to mod AI in 2015 by last echo.    -  Arrange FU echo to recheck EF and AS.  5. ESRD - Dialysis on MWF.     6. HTN - BP controlled.   7. Abnormal aorta - Prior notes indicate an apparent echodensity in aortic arch on echo. Last note by Dr. Marca Ancona addressed potentially proceeding with TEE but this  was deferred b/c management would not likely be changed.    8. HL - Continue Lipitor 40.   Disposition - He will need FU with Dr. Marca Ancona in the [facility] as this is where he was previously seen.   Medication Adjustments/Labs and Tests Ordered: Current medicines are reviewed at length with the patient today.  Concerns regarding medicines are outlined above.  Medication changes, Labs and Tests ordered today are outlined in the Patient Instructions noted below. Patient Instructions  Medication Instructions:  DO NOT TAKE the Isosorbide Mononitrate (Imdur)  **I want you to check with your kidney doctor to see if it is ok for you to take** Lisinopril 2.5 mg Once daily.  I have given you a written prescription to show the kidney doctor.  Do not fill it unless he/she says  that it is ok to take.  Labwork: Get LFTs, TSH drawn at dialysis and have the results faxed to [facility], PA-C [phone]).  Testing/Procedures: Your physician has requested that you have an echocardiogram. Echocardiography is a painless test that uses sound waves to create images of your heart. It provides your doctor with information about the size and shape of your heart and how well your heart's chambers and valves are working. This procedure takes approximately one hour. There are no restrictions for this procedure.   Follow-Up: Dr. Marca Ancona in 1 month in the [facility] Failure Clinic.  Any Other Special Instructions Will Be Listed Below (If Applicable).  If you need a refill on your cardiac medications before your next appointment, please call your pharmacy.   Signed, Tereso Newcomer, PA-C  11/27/2015 4:33 PM    [facility] [address] [place], [place], [place]  [address] Phone: [phone]; Fax: [phone]

## 2015-12-06 ENCOUNTER — Ambulatory Visit (HOSPITAL_COMMUNITY): Payer: Medicare HMO

## 2015-12-18 ENCOUNTER — Other Ambulatory Visit (HOSPITAL_COMMUNITY): Payer: Self-pay

## 2015-12-25 ENCOUNTER — Ambulatory Visit (HOSPITAL_COMMUNITY)
Admission: RE | Admit: 2015-12-25 | Discharge: 2015-12-25 | Disposition: A | Discharge status: Home / Self Care | Payer: Medicare HMO | Source: Ambulatory Visit | Attending: Cardiology | Admitting: Cardiology

## 2015-12-25 ENCOUNTER — Encounter (HOSPITAL_COMMUNITY): Payer: Self-pay

## 2015-12-25 VITALS — BP 132/86 | HR 96 | Wt 224.1 lb

## 2015-12-25 DIAGNOSIS — Z79899 Other long term (current) drug therapy: Secondary | ICD-10-CM | POA: Diagnosis not present

## 2015-12-25 DIAGNOSIS — R5383 Other fatigue: Secondary | ICD-10-CM | POA: Diagnosis not present

## 2015-12-25 DIAGNOSIS — N186 End stage renal disease: Secondary | ICD-10-CM | POA: Insufficient documentation

## 2015-12-25 DIAGNOSIS — Z940 Kidney transplant status: Secondary | ICD-10-CM | POA: Insufficient documentation

## 2015-12-25 DIAGNOSIS — E785 Hyperlipidemia, unspecified: Secondary | ICD-10-CM | POA: Diagnosis not present

## 2015-12-25 DIAGNOSIS — I4892 Unspecified atrial flutter: Secondary | ICD-10-CM | POA: Diagnosis not present

## 2015-12-25 DIAGNOSIS — I350 Nonrheumatic aortic (valve) stenosis: Secondary | ICD-10-CM

## 2015-12-25 DIAGNOSIS — Z7982 Long term (current) use of aspirin: Secondary | ICD-10-CM | POA: Diagnosis not present

## 2015-12-25 DIAGNOSIS — I132 Hypertensive heart and chronic kidney disease with heart failure and with stage 5 chronic kidney disease, or end stage renal disease: Secondary | ICD-10-CM | POA: Diagnosis not present

## 2015-12-25 DIAGNOSIS — I352 Nonrheumatic aortic (valve) stenosis with insufficiency: Secondary | ICD-10-CM | POA: Insufficient documentation

## 2015-12-25 DIAGNOSIS — I5032 Chronic diastolic (congestive) heart failure: Secondary | ICD-10-CM

## 2015-12-25 DIAGNOSIS — Z993 Dependence on wheelchair: Secondary | ICD-10-CM | POA: Diagnosis not present

## 2015-12-25 LAB — CBC
HEMATOCRIT: 49.2 % (ref 39.0–52.0)
Hemoglobin: 15.9 g/dL (ref 13.0–17.0)
MCH: 31.1 pg (ref 26.0–34.0)
MCHC: 32.3 g/dL (ref 30.0–36.0)
MCV: 96.3 fL (ref 78.0–100.0)
Platelets: 134 10*3/uL — ABNORMAL LOW (ref 150–400)
RBC: 5.11 MIL/uL (ref 4.22–5.81)
RDW: 15.7 % — AB (ref 11.5–15.5)
WBC: 10.2 10*3/uL (ref 4.0–10.5)

## 2015-12-25 LAB — BASIC METABOLIC PANEL
Anion gap: 20 — ABNORMAL HIGH (ref 5–15)
BUN: 36 mg/dL — AB (ref 6–20)
CHLORIDE: 102 mmol/L (ref 101–111)
CO2: 18 mmol/L — AB (ref 22–32)
CREATININE: 6.44 mg/dL — AB (ref 0.61–1.24)
Calcium: 8.9 mg/dL (ref 8.9–10.3)
GFR calc non Af Amer: 8 mL/min — ABNORMAL LOW (ref >60)
GFR, EST AFRICAN AMERICAN: 10 mL/min — AB (ref >60)
Glucose, Bld: 48 mg/dL — ABNORMAL LOW (ref 65–99)
Potassium: 5 mmol/L (ref 3.5–5.1)
Sodium: 140 mmol/L (ref 135–145)

## 2015-12-25 LAB — TSH: TSH: 2.253 u[IU]/mL (ref 0.350–4.500)

## 2015-12-25 NOTE — Patient Instructions (Signed)
Labs today (will call for abnormal results, otherwise no news is good news)   Please ask your Kidney MD if you need to take Midodrine before hemodialysis.  Follow up in 6 months

## 2015-12-27 NOTE — Progress Notes (Signed)
Patient ID: Chad BargesKenneth F [name], male   DOB: 11/12/1955, 60 y.o.   MRN: [id number] Nephrology: Dr. Allena KatzPatel Orthopedic: Dr Victorino DikeHewitt Cardiology: Dr. Shirlee LatchMcLean  60 yo with history of renal transplant, HCV, paroxysmal atrial flutter, and chronic systolic CHF.  He has had a complicated clinical course.  In 05/2011, he developed parainfluenza PNA that progressed to ARDS.  He had septic shock and was briefly on CVVH.  He went into atrial flutter with RVR (converted back to NSR spontaneously).  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  He went to inpatient rehab until 06/2011. In 07/2011, he was re-hospitalized with a lower leg wound infection.  Proteus and Serratia grew out.  He was admitted again in 09/2011 with acute/chronic diastolic CHF.  Repeat echo showed EF 30-35%.  Lexiscan myoview showed EF 42% with no ischemia or infarction.    06/2012 he had a left distal femur fracture with ORIF.  This was complicated by a wound infection with Proteus and Enterococcus and he had a re-operation.  He is now wheelchair-bound.    Most recent echo in 5/15 showed EF 60% with mild AS and mild-moderate AI.    He has had failure of his renal transplant and is now back on dialysis.   Weight is up about 10 lbs recently.  He is essentially wheelchair-bound.  Ambulation is very limited due to left leg pain.  No recent GI bleeding.  He has been having low back pain and abdominal discomfort during HD, has not been having as much fluid pulled off. He tends to get more short of breath and orthopneic the day prior to HD.  No chest pain.    Labs (9/13): K 4.2 => 4.6, creatinine 3.12 => 3.2, BNP 25000, TSH normal, LDL 153, HDL 40, LFTs normal Labs (82/9511/13): K 3.3, creatinine 3.1, BUN 55 Labs (9/14): creatinine 2.82 Labs (12/14): K 4.3, creatinine 4.19, BUN 88, BNP 19699, HCT 34.1 Labs (1/15): Pro-BNP 1069, K+ 4.8, Cr 3.3, TSH 4.9, AST 20, ALT 13 Labs (02/16/13): K 4.1 Creatinine 3.5 Pro BNP 12379, LDL 124  ECG (10/17):  NSR, IVCD 138 msec  PMH: 1. Atrial flutter in setting of septic shock/PNA in 5/13.  2. ESRD: Renal transplant 1996. Failure of transplant, now ESRD.  3. HTN 4. HCV 5. OSA: not currently on CPAP.  6. Parainfluenza PNA with ARDS and septic shock in 5/13.  He required CVVH briefly.  Recurrent PNA in 11/13.  7. Lower leg wound infection 7/13 with Proteus and Serratia.  8. Chronic systolic CHF: Echo (5/13) with EF 15-20%, diffuse hypokinesis.  Repeat echo in (9/13) with EF 30-35%, diffuse hypokinesis worse inferoposteriorly, mild to moderate MR with mild MS, mildly dilated RV with mildly decreased systolic function, PASP 73 mmHg.  Lexiscan myoview (9/13) with EF 42%, diffuse hypokinesis, no evidence for ischemia or infarction.  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  Echo (5/15) with EF 60%, mild AS, mild-moderate AI. 9. H/o GI bleed 10. Distal femur fracture with ORIF 6/14 complicated by wound infection.  11. Aortic valve disorders: Mild AS, mild-moderate AI.   SH: [place] in [place], nonsmoker, unemployed but used to sell appliances for [employer], 1 son.   FH: No premature CAD.    ROS: All systems reviewed and negative except as per HPI.   Current Outpatient Prescriptions  Medication Sig Dispense Refill  . allopurinol (ZYLOPRIM) 100 MG tablet Take 1 tablet (100 mg total) by mouth 2 (two) times daily.  180 tablet 3  . amiodarone (PACERONE) 200 MG tablet TAKE 1 TABLET BY MOUTH EVERY DAY 30 tablet 6  . aspirin EC 81 MG tablet Take 81 mg by mouth daily.    Marland Kitchen atorvastatin (LIPITOR) 40 MG tablet Take 1 tablet (40 mg total) by mouth at bedtime. 90 tablet 3  . calcium carbonate, dosed in mg elemental calcium, 1250 (500 CA) MG/5ML Take 15 mLs (1,500 mg of elemental calcium total) by mouth every 6 (six) hours as needed for indigestion. 450 mL   . carvedilol (COREG) 3.125 MG tablet Take 1 tablet (3.125 mg total) by mouth 2 (two) times daily. 60 tablet 3  . cefTAZidime 2 g in  dextrose 5 % 50 mL Inject 2 g into the vein every Monday, Wednesday, and Friday with hemodialysis.    Marland Kitchen doxercalciferol (HECTOROL) 4 MCG/2ML injection Inject 0.5 mLs (1 mcg total) into the vein every Monday, Wednesday, and Friday with hemodialysis. 2 mL   . predniSONE (DELTASONE) 10 MG tablet Take 10 mg by mouth daily with breakfast.    . saccharomyces boulardii (FLORASTOR) 250 MG capsule Take 1 capsule (250 mg total) by mouth 2 (two) times daily.    . sevelamer carbonate (RENVELA) 800 MG tablet Take 800 mg by mouth 3 (three) times daily with meals.     No current facility-administered medications for this encounter.    Vitals:   12/25/15 1056  BP: 132/86  Pulse: 96  SpO2: 97%  Weight: 224 lb 1.9 oz (101.7 kg)    General: NAD, obese, in wheelchair Neck: JVP 9-10 cm, no thyromegaly or thyroid nodule.  Lungs: Clear to auscultation bilaterally with normal respiraorty effort. CV: Nondisplaced PMI.  Heart regular S1/S2, no S3/S4, 3/6 SEM with some obscuring of S2.   No carotid bruit.  2+ ankle edema.  Abdomen: Soft, nontender, no hepatosplenomegaly, no distention.  Neurologic: Alert and oriented x 3.  Psych: Normal affect. Extremities: No clubbing or cyanosis.   Assessment/Plan:  1. Chronic diastolic CHF: EF 60-04% (11/2011) => EF 60% (5/15).  No need for ICD. Prior myoview showed no ischemia or infarction.  He is volume overloaded on exam.  Abdominal pain, low back pain and low BP have limited fluid pulled off with HD.    - Would ideally get more fluid pulled off with HD. He is already holding Coreg the am of HD, may need midodrine the am of HD.  - Needs repeat echo.  2. Atrial flutter: Maintaining SR on amio. He is not on coumadin due to history of GI bleeding.  Check LFTs, TSH today.  He should have a yearly eye exam.  If he has recurrent flutter or fibrillation, would need to reconsider anticoagulation.  3. Hyperlipidemia:  Continue statin.   4. ESRD: S/P renal transplant with  transplant failure.  5. HTN: BP not elevated.  6. Aortic valve disorder: Mild AS, mild to moderate AI on 2015 echo.  Concerned for worse AS with character of murmur and BP trouble with HD.  Needs repeat echo.     Followup in 6 months.   Kateria Cutrona,MD 12/27/2015

## 2016-01-08 ENCOUNTER — Encounter: Payer: Self-pay | Admitting: Physician Assistant

## 2016-01-08 ENCOUNTER — Ambulatory Visit (HOSPITAL_COMMUNITY): Payer: Medicare HMO | Attending: Cardiology

## 2016-01-08 ENCOUNTER — Telehealth: Payer: Self-pay | Admitting: *Deleted

## 2016-01-08 ENCOUNTER — Other Ambulatory Visit: Payer: Self-pay

## 2016-01-08 DIAGNOSIS — I5031 Acute diastolic (congestive) heart failure: Secondary | ICD-10-CM | POA: Diagnosis present

## 2016-01-08 DIAGNOSIS — I5032 Chronic diastolic (congestive) heart failure: Secondary | ICD-10-CM | POA: Insufficient documentation

## 2016-01-08 DIAGNOSIS — Z992 Dependence on renal dialysis: Secondary | ICD-10-CM | POA: Insufficient documentation

## 2016-01-08 DIAGNOSIS — I77810 Thoracic aortic ectasia: Secondary | ICD-10-CM | POA: Diagnosis not present

## 2016-01-08 DIAGNOSIS — G4733 Obstructive sleep apnea (adult) (pediatric): Secondary | ICD-10-CM | POA: Insufficient documentation

## 2016-01-08 DIAGNOSIS — E785 Hyperlipidemia, unspecified: Secondary | ICD-10-CM | POA: Diagnosis not present

## 2016-01-08 DIAGNOSIS — I071 Rheumatic tricuspid insufficiency: Secondary | ICD-10-CM | POA: Diagnosis not present

## 2016-01-08 DIAGNOSIS — I059 Rheumatic mitral valve disease, unspecified: Secondary | ICD-10-CM | POA: Diagnosis not present

## 2016-01-08 DIAGNOSIS — I350 Nonrheumatic aortic (valve) stenosis: Secondary | ICD-10-CM | POA: Diagnosis present

## 2016-01-08 DIAGNOSIS — Z940 Kidney transplant status: Secondary | ICD-10-CM | POA: Diagnosis not present

## 2016-01-08 DIAGNOSIS — I132 Hypertensive heart and chronic kidney disease with heart failure and with stage 5 chronic kidney disease, or end stage renal disease: Secondary | ICD-10-CM | POA: Insufficient documentation

## 2016-01-08 DIAGNOSIS — R29898 Other symptoms and signs involving the musculoskeletal system: Secondary | ICD-10-CM | POA: Diagnosis not present

## 2016-01-08 DIAGNOSIS — I352 Nonrheumatic aortic (valve) stenosis with insufficiency: Secondary | ICD-10-CM | POA: Diagnosis not present

## 2016-01-08 DIAGNOSIS — N186 End stage renal disease: Secondary | ICD-10-CM | POA: Diagnosis not present

## 2016-01-08 NOTE — Telephone Encounter (Signed)
Lmtcb to go over echo results.  

## 2016-01-09 NOTE — Telephone Encounter (Signed)
Pt notified of echo results and findings by phone with verbal understanding. Per Dr. Shirlee Latch if pt was having chest pain he suggest a Myoview. Pt denies any chest pain. Pt aware that we will continue on current Tx plan.

## 2016-02-13 ENCOUNTER — Other Ambulatory Visit: Payer: Self-pay | Admitting: Cardiology

## 2016-02-20 ENCOUNTER — Emergency Department (HOSPITAL_COMMUNITY): Payer: Medicare HMO

## 2016-02-20 ENCOUNTER — Encounter (HOSPITAL_COMMUNITY): Payer: Self-pay | Admitting: Emergency Medicine

## 2016-02-20 ENCOUNTER — Inpatient Hospital Stay (HOSPITAL_COMMUNITY)
Admission: EM | Admit: 2016-02-20 | Discharge: 2016-02-25 | DRG: 314 | Disposition: A | Discharge status: Home / Self Care | Payer: Medicare HMO | Attending: Internal Medicine | Admitting: Internal Medicine

## 2016-02-20 DIAGNOSIS — E8889 Other specified metabolic disorders: Secondary | ICD-10-CM | POA: Diagnosis present

## 2016-02-20 DIAGNOSIS — Z7952 Long term (current) use of systemic steroids: Secondary | ICD-10-CM

## 2016-02-20 DIAGNOSIS — M40204 Unspecified kyphosis, thoracic region: Secondary | ICD-10-CM | POA: Diagnosis present

## 2016-02-20 DIAGNOSIS — G4733 Obstructive sleep apnea (adult) (pediatric): Secondary | ICD-10-CM | POA: Diagnosis present

## 2016-02-20 DIAGNOSIS — Z940 Kidney transplant status: Secondary | ICD-10-CM

## 2016-02-20 DIAGNOSIS — Z87442 Personal history of urinary calculi: Secondary | ICD-10-CM

## 2016-02-20 DIAGNOSIS — N186 End stage renal disease: Secondary | ICD-10-CM

## 2016-02-20 DIAGNOSIS — E2740 Unspecified adrenocortical insufficiency: Secondary | ICD-10-CM | POA: Diagnosis present

## 2016-02-20 DIAGNOSIS — Z96642 Presence of left artificial hip joint: Secondary | ICD-10-CM | POA: Diagnosis present

## 2016-02-20 DIAGNOSIS — J984 Other disorders of lung: Secondary | ICD-10-CM | POA: Diagnosis present

## 2016-02-20 DIAGNOSIS — A419 Sepsis, unspecified organism: Secondary | ICD-10-CM

## 2016-02-20 DIAGNOSIS — D631 Anemia in chronic kidney disease: Secondary | ICD-10-CM | POA: Diagnosis present

## 2016-02-20 DIAGNOSIS — J069 Acute upper respiratory infection, unspecified: Secondary | ICD-10-CM | POA: Diagnosis present

## 2016-02-20 DIAGNOSIS — N2581 Secondary hyperparathyroidism of renal origin: Secondary | ICD-10-CM | POA: Diagnosis present

## 2016-02-20 DIAGNOSIS — Z8619 Personal history of other infectious and parasitic diseases: Secondary | ICD-10-CM

## 2016-02-20 DIAGNOSIS — K219 Gastro-esophageal reflux disease without esophagitis: Secondary | ICD-10-CM | POA: Diagnosis present

## 2016-02-20 DIAGNOSIS — I132 Hypertensive heart and chronic kidney disease with heart failure and with stage 5 chronic kidney disease, or end stage renal disease: Secondary | ICD-10-CM | POA: Diagnosis present

## 2016-02-20 DIAGNOSIS — E1122 Type 2 diabetes mellitus with diabetic chronic kidney disease: Secondary | ICD-10-CM | POA: Diagnosis present

## 2016-02-20 DIAGNOSIS — Z79899 Other long term (current) drug therapy: Secondary | ICD-10-CM

## 2016-02-20 DIAGNOSIS — E669 Obesity, unspecified: Secondary | ICD-10-CM | POA: Diagnosis present

## 2016-02-20 DIAGNOSIS — Z992 Dependence on renal dialysis: Secondary | ICD-10-CM

## 2016-02-20 DIAGNOSIS — I480 Paroxysmal atrial fibrillation: Secondary | ICD-10-CM | POA: Diagnosis present

## 2016-02-20 DIAGNOSIS — E785 Hyperlipidemia, unspecified: Secondary | ICD-10-CM | POA: Diagnosis present

## 2016-02-20 DIAGNOSIS — I959 Hypotension, unspecified: Principal | ICD-10-CM | POA: Diagnosis present

## 2016-02-20 DIAGNOSIS — D696 Thrombocytopenia, unspecified: Secondary | ICD-10-CM | POA: Diagnosis present

## 2016-02-20 DIAGNOSIS — N269 Renal sclerosis, unspecified: Secondary | ICD-10-CM | POA: Diagnosis present

## 2016-02-20 DIAGNOSIS — Z993 Dependence on wheelchair: Secondary | ICD-10-CM

## 2016-02-20 DIAGNOSIS — B1920 Unspecified viral hepatitis C without hepatic coma: Secondary | ICD-10-CM | POA: Diagnosis present

## 2016-02-20 DIAGNOSIS — I420 Dilated cardiomyopathy: Secondary | ICD-10-CM | POA: Diagnosis present

## 2016-02-20 DIAGNOSIS — I4892 Unspecified atrial flutter: Secondary | ICD-10-CM | POA: Diagnosis present

## 2016-02-20 DIAGNOSIS — Z6831 Body mass index (BMI) 31.0-31.9, adult: Secondary | ICD-10-CM

## 2016-02-20 DIAGNOSIS — Z7982 Long term (current) use of aspirin: Secondary | ICD-10-CM

## 2016-02-20 DIAGNOSIS — R0602 Shortness of breath: Secondary | ICD-10-CM

## 2016-02-20 DIAGNOSIS — Z8249 Family history of ischemic heart disease and other diseases of the circulatory system: Secondary | ICD-10-CM

## 2016-02-20 DIAGNOSIS — I5022 Chronic systolic (congestive) heart failure: Secondary | ICD-10-CM | POA: Diagnosis present

## 2016-02-20 DIAGNOSIS — E43 Unspecified severe protein-calorie malnutrition: Secondary | ICD-10-CM | POA: Diagnosis present

## 2016-02-20 LAB — CBC WITH DIFFERENTIAL/PLATELET
Basophils Absolute: 0 10*3/uL (ref 0.0–0.1)
Basophils Relative: 0 %
EOS ABS: 0.3 10*3/uL (ref 0.0–0.7)
EOS PCT: 4 %
HCT: 44.6 % (ref 39.0–52.0)
HEMOGLOBIN: 14.6 g/dL (ref 13.0–17.0)
LYMPHS ABS: 1.6 10*3/uL (ref 0.7–4.0)
Lymphocytes Relative: 22 %
MCH: 32.1 pg (ref 26.0–34.0)
MCHC: 32.7 g/dL (ref 30.0–36.0)
MCV: 98 fL (ref 78.0–100.0)
MONOS PCT: 14 %
Monocytes Absolute: 1 10*3/uL (ref 0.1–1.0)
Neutro Abs: 4.2 10*3/uL (ref 1.7–7.7)
Neutrophils Relative %: 60 %
PLATELETS: 128 10*3/uL — AB (ref 150–400)
RBC: 4.55 MIL/uL (ref 4.22–5.81)
RDW: 17.3 % — ABNORMAL HIGH (ref 11.5–15.5)
WBC: 7 10*3/uL (ref 4.0–10.5)

## 2016-02-20 LAB — COMPREHENSIVE METABOLIC PANEL
ALT: 8 U/L — AB (ref 17–63)
AST: 18 U/L (ref 15–41)
Albumin: 2.9 g/dL — ABNORMAL LOW (ref 3.5–5.0)
Alkaline Phosphatase: 85 U/L (ref 38–126)
Anion gap: 13 (ref 5–15)
BUN: 11 mg/dL (ref 6–20)
CHLORIDE: 93 mmol/L — AB (ref 101–111)
CO2: 26 mmol/L (ref 22–32)
CREATININE: 3.91 mg/dL — AB (ref 0.61–1.24)
Calcium: 8.5 mg/dL — ABNORMAL LOW (ref 8.9–10.3)
GFR calc Af Amer: 18 mL/min — ABNORMAL LOW (ref >60)
GFR calc non Af Amer: 15 mL/min — ABNORMAL LOW (ref >60)
GLUCOSE: 73 mg/dL (ref 65–99)
Potassium: 4.1 mmol/L (ref 3.5–5.1)
SODIUM: 132 mmol/L — AB (ref 135–145)
Total Bilirubin: 1.4 mg/dL — ABNORMAL HIGH (ref 0.3–1.2)
Total Protein: 7.9 g/dL (ref 6.5–8.1)

## 2016-02-20 LAB — I-STAT CG4 LACTIC ACID, ED
LACTIC ACID, VENOUS: 3.16 mmol/L — AB (ref 0.5–1.9)
Lactic Acid, Venous: 2.26 mmol/L (ref 0.5–1.9)

## 2016-02-20 LAB — INFLUENZA PANEL BY PCR (TYPE A & B)
INFLAPCR: NEGATIVE
Influenza B By PCR: NEGATIVE

## 2016-02-20 MED ORDER — ONDANSETRON HCL 4 MG/2ML IJ SOLN
4.0000 mg | Freq: Once | INTRAMUSCULAR | Status: AC
  Administered 2016-02-20: 4 mg via INTRAVENOUS
  Filled 2016-02-20: qty 2

## 2016-02-20 MED ORDER — VANCOMYCIN HCL IN DEXTROSE 1-5 GM/200ML-% IV SOLN
1000.0000 mg | Freq: Once | INTRAVENOUS | Status: DC
  Filled 2016-02-20: qty 200

## 2016-02-20 MED ORDER — SODIUM CHLORIDE 0.9 % IV BOLUS (SEPSIS)
250.0000 mL | Freq: Once | INTRAVENOUS | Status: AC
  Administered 2016-02-20: 250 mL via INTRAVENOUS

## 2016-02-20 MED ORDER — SODIUM CHLORIDE 0.9 % IV BOLUS (SEPSIS)
1000.0000 mL | Freq: Once | INTRAVENOUS | Status: AC
  Administered 2016-02-20: 1000 mL via INTRAVENOUS

## 2016-02-20 MED ORDER — PIPERACILLIN-TAZOBACTAM 3.375 G IVPB 30 MIN
3.3750 g | Freq: Once | INTRAVENOUS | Status: AC
  Administered 2016-02-20: 3.375 g via INTRAVENOUS
  Filled 2016-02-20: qty 50

## 2016-02-20 MED ORDER — SODIUM CHLORIDE 0.9 % IV BOLUS (SEPSIS): 250.0000 mL | Freq: Once | INTRAVENOUS | Status: DC

## 2016-02-20 MED ORDER — SODIUM CHLORIDE 0.9 % IV SOLN
INTRAVENOUS | Status: DC
  Administered 2016-02-21: 02:00:00 via INTRAVENOUS

## 2016-02-20 MED ORDER — PIPERACILLIN-TAZOBACTAM 3.375 G IVPB
3.3750 g | Freq: Two times a day (BID) | INTRAVENOUS | Status: DC
  Administered 2016-02-21 – 2016-02-22 (×2): 3.375 g via INTRAVENOUS
  Filled 2016-02-20 (×3): qty 50

## 2016-02-20 MED ORDER — SODIUM CHLORIDE 0.9 % IV SOLN
2000.0000 mg | Freq: Once | INTRAVENOUS | Status: AC
  Administered 2016-02-20: 2000 mg via INTRAVENOUS
  Filled 2016-02-20: qty 2000

## 2016-02-20 MED ORDER — IOPAMIDOL (ISOVUE-300) INJECTION 61%
INTRAVENOUS | Status: AC
  Administered 2016-02-20: 100 mL
  Filled 2016-02-20: qty 100

## 2016-02-20 MED ORDER — VANCOMYCIN HCL IN DEXTROSE 1-5 GM/200ML-% IV SOLN
1000.0000 mg | INTRAVENOUS | Status: DC
  Filled 2016-02-20: qty 200

## 2016-02-20 NOTE — ED Notes (Signed)
Antibiotics need to be started before 1855, attempted multiple times to get second IV and second set of blood cultures. Phlebotomy en route. Per MD, hold further fluids once pt BP systolic reaches above 90.

## 2016-02-20 NOTE — ED Notes (Signed)
Patient transported to X-ray 

## 2016-02-20 NOTE — ED Notes (Addendum)
Phlebotomy at bedside to get second culture and repeat lactic acid

## 2016-02-20 NOTE — ED Triage Notes (Signed)
Pt arrives from dialysis via [facility] reporting hypotension and RLQ abd pain.  Pt denies CP, N/V/D, fever, chills, LOC, SOB.  AOx4, NAD noted at this time.  Graft deaccessed.

## 2016-02-20 NOTE — ED Notes (Signed)
Dr. Deretha Emory ordered 250cc bolus for low BP

## 2016-02-20 NOTE — ED Notes (Signed)
Spoke with Dr. Deretha Emory about pt elevated lactate and low BP, no new orders

## 2016-02-20 NOTE — ED Notes (Signed)
MD made aware of critically low Bp.

## 2016-02-20 NOTE — ED Notes (Signed)
Pt returned from CT °

## 2016-02-20 NOTE — Progress Notes (Signed)
Pharmacy Antibiotic Note  Chad [name] is a 61 y.o. male admitted on 02/20/2016 with sepsis.  Pharmacy has been consulted for vancomycin and zosyn dosing. WBC is WNL and no temperature available at this time. Pt with elevated lactic acid at 2.26. Of note, pt is ESRD on HD and did not receive antibiotic at HD today.   Plan: Vanc 2gm IV x 1 then 1gm post-HD Zosyn 3.357gm IV Q12H (4 hr inf) F/u renal plans, C&S, clinical status and pre-HD vanc level when appropriate  Height: 6\' 1"  (185.4 cm) Weight: 214 lb (97.1 kg) IBW/kg (Calculated) : 79.9  No data recorded.   Recent Labs Lab 02/20/16 1556 02/20/16 1613  WBC 7.0  --   CREATININE 3.91*  --   LATICACIDVEN  --  2.26*    Estimated Creatinine Clearance: 25 mL/min (by C-G formula based on SCr of 3.91 mg/dL (H)).    No Known Allergies  Antimicrobials this admission: Vanc 1/24>> Zosyn 1/24>>  Dose adjustments this admission: N/A  Microbiology results: Pending  Thank you for allowing pharmacy to be a part of this patient's care.  Erikson Danzy, Drake Leach 02/20/2016 6:21 PM

## 2016-02-21 ENCOUNTER — Encounter (HOSPITAL_COMMUNITY): Payer: Self-pay | Admitting: Internal Medicine

## 2016-02-21 ENCOUNTER — Observation Stay (HOSPITAL_BASED_OUTPATIENT_CLINIC_OR_DEPARTMENT_OTHER): Payer: Medicare HMO

## 2016-02-21 DIAGNOSIS — I959 Hypotension, unspecified: Principal | ICD-10-CM

## 2016-02-21 DIAGNOSIS — I420 Dilated cardiomyopathy: Secondary | ICD-10-CM

## 2016-02-21 DIAGNOSIS — I4892 Unspecified atrial flutter: Secondary | ICD-10-CM | POA: Diagnosis present

## 2016-02-21 DIAGNOSIS — N186 End stage renal disease: Secondary | ICD-10-CM | POA: Diagnosis not present

## 2016-02-21 DIAGNOSIS — E8889 Other specified metabolic disorders: Secondary | ICD-10-CM | POA: Diagnosis present

## 2016-02-21 DIAGNOSIS — Z992 Dependence on renal dialysis: Secondary | ICD-10-CM | POA: Diagnosis not present

## 2016-02-21 DIAGNOSIS — E785 Hyperlipidemia, unspecified: Secondary | ICD-10-CM | POA: Diagnosis present

## 2016-02-21 DIAGNOSIS — N2581 Secondary hyperparathyroidism of renal origin: Secondary | ICD-10-CM | POA: Diagnosis present

## 2016-02-21 DIAGNOSIS — R079 Chest pain, unspecified: Secondary | ICD-10-CM

## 2016-02-21 DIAGNOSIS — I5022 Chronic systolic (congestive) heart failure: Secondary | ICD-10-CM | POA: Diagnosis present

## 2016-02-21 DIAGNOSIS — E1122 Type 2 diabetes mellitus with diabetic chronic kidney disease: Secondary | ICD-10-CM | POA: Diagnosis present

## 2016-02-21 DIAGNOSIS — E2740 Unspecified adrenocortical insufficiency: Secondary | ICD-10-CM | POA: Diagnosis present

## 2016-02-21 DIAGNOSIS — E669 Obesity, unspecified: Secondary | ICD-10-CM | POA: Diagnosis present

## 2016-02-21 DIAGNOSIS — G4733 Obstructive sleep apnea (adult) (pediatric): Secondary | ICD-10-CM | POA: Diagnosis present

## 2016-02-21 DIAGNOSIS — Z940 Kidney transplant status: Secondary | ICD-10-CM | POA: Diagnosis not present

## 2016-02-21 DIAGNOSIS — I480 Paroxysmal atrial fibrillation: Secondary | ICD-10-CM | POA: Diagnosis not present

## 2016-02-21 DIAGNOSIS — K219 Gastro-esophageal reflux disease without esophagitis: Secondary | ICD-10-CM | POA: Diagnosis present

## 2016-02-21 DIAGNOSIS — E43 Unspecified severe protein-calorie malnutrition: Secondary | ICD-10-CM | POA: Diagnosis present

## 2016-02-21 DIAGNOSIS — I132 Hypertensive heart and chronic kidney disease with heart failure and with stage 5 chronic kidney disease, or end stage renal disease: Secondary | ICD-10-CM | POA: Diagnosis present

## 2016-02-21 DIAGNOSIS — B1920 Unspecified viral hepatitis C without hepatic coma: Secondary | ICD-10-CM | POA: Diagnosis present

## 2016-02-21 DIAGNOSIS — J069 Acute upper respiratory infection, unspecified: Secondary | ICD-10-CM | POA: Diagnosis present

## 2016-02-21 DIAGNOSIS — J984 Other disorders of lung: Secondary | ICD-10-CM | POA: Diagnosis present

## 2016-02-21 DIAGNOSIS — D696 Thrombocytopenia, unspecified: Secondary | ICD-10-CM | POA: Diagnosis present

## 2016-02-21 DIAGNOSIS — D631 Anemia in chronic kidney disease: Secondary | ICD-10-CM | POA: Diagnosis present

## 2016-02-21 DIAGNOSIS — M40204 Unspecified kyphosis, thoracic region: Secondary | ICD-10-CM | POA: Diagnosis present

## 2016-02-21 LAB — COMPREHENSIVE METABOLIC PANEL
ALBUMIN: 2.2 g/dL — AB (ref 3.5–5.0)
ALT: 5 U/L — AB (ref 17–63)
AST: 15 U/L (ref 15–41)
Alkaline Phosphatase: 66 U/L (ref 38–126)
Anion gap: 11 (ref 5–15)
BUN: 12 mg/dL (ref 6–20)
CHLORIDE: 100 mmol/L — AB (ref 101–111)
CO2: 24 mmol/L (ref 22–32)
CREATININE: 4.57 mg/dL — AB (ref 0.61–1.24)
Calcium: 8.4 mg/dL — ABNORMAL LOW (ref 8.9–10.3)
GFR calc Af Amer: 15 mL/min — ABNORMAL LOW (ref >60)
GFR, EST NON AFRICAN AMERICAN: 13 mL/min — AB (ref >60)
GLUCOSE: 86 mg/dL (ref 65–99)
POTASSIUM: 4.4 mmol/L (ref 3.5–5.1)
SODIUM: 135 mmol/L (ref 135–145)
Total Bilirubin: 0.9 mg/dL (ref 0.3–1.2)
Total Protein: 6.7 g/dL (ref 6.5–8.1)

## 2016-02-21 LAB — CBC WITH DIFFERENTIAL/PLATELET
BASOS ABS: 0 10*3/uL (ref 0.0–0.1)
Basophils Absolute: 0 10*3/uL (ref 0.0–0.1)
Basophils Relative: 1 %
Basophils Relative: 1 %
EOS ABS: 0.2 10*3/uL (ref 0.0–0.7)
EOS PCT: 2 %
Eosinophils Absolute: 0.2 10*3/uL (ref 0.0–0.7)
Eosinophils Relative: 4 %
HCT: 42.2 % (ref 39.0–52.0)
HEMATOCRIT: 40.3 % (ref 39.0–52.0)
Hemoglobin: 12.6 g/dL — ABNORMAL LOW (ref 13.0–17.0)
Hemoglobin: 13.3 g/dL (ref 13.0–17.0)
LYMPHS ABS: 0.9 10*3/uL (ref 0.7–4.0)
LYMPHS PCT: 22 %
LYMPHS PCT: 12 %
Lymphs Abs: 1.4 10*3/uL (ref 0.7–4.0)
MCH: 31.1 pg (ref 26.0–34.0)
MCH: 31.3 pg (ref 26.0–34.0)
MCHC: 31.3 g/dL (ref 30.0–36.0)
MCHC: 31.5 g/dL (ref 30.0–36.0)
MCV: 99.5 fL (ref 78.0–100.0)
MCV: 99.3 fL (ref 78.0–100.0)
MONO ABS: 0.9 10*3/uL (ref 0.1–1.0)
MONO ABS: 1.1 10*3/uL — AB (ref 0.1–1.0)
MONOS PCT: 15 %
MONOS PCT: 15 %
NEUTROS ABS: 3.8 10*3/uL (ref 1.7–7.7)
Neutro Abs: 5 10*3/uL (ref 1.7–7.7)
Neutrophils Relative %: 59 %
Neutrophils Relative %: 70 %
PLATELETS: 107 10*3/uL — AB (ref 150–400)
Platelets: 112 10*3/uL — ABNORMAL LOW (ref 150–400)
RBC: 4.05 MIL/uL — ABNORMAL LOW (ref 4.22–5.81)
RBC: 4.25 MIL/uL (ref 4.22–5.81)
RDW: 17.5 % — AB (ref 11.5–15.5)
RDW: 17.5 % — ABNORMAL HIGH (ref 11.5–15.5)
WBC: 6.3 10*3/uL (ref 4.0–10.5)
WBC: 7.2 10*3/uL (ref 4.0–10.5)

## 2016-02-21 LAB — MRSA PCR SCREENING: MRSA BY PCR: POSITIVE — AB

## 2016-02-21 LAB — TROPONIN I
Troponin I: 0.13 ng/mL (ref <0.03)
Troponin I: 0.18 ng/mL (ref <0.03)
Troponin I: 0.15 ng/mL (ref <0.03)

## 2016-02-21 LAB — ECHOCARDIOGRAM COMPLETE
Height: 73 in
Weight: 3675.51 oz

## 2016-02-21 LAB — TYPE AND SCREEN
ABO/RH(D): B POS
Antibody Screen: NEGATIVE

## 2016-02-21 LAB — I-STAT CG4 LACTIC ACID, ED: LACTIC ACID, VENOUS: 2.41 mmol/L — AB (ref 0.5–1.9)

## 2016-02-21 LAB — CORTISOL: Cortisol, Plasma: 5.9 ug/dL

## 2016-02-21 MED ORDER — DEXAMETHASONE SODIUM PHOSPHATE 10 MG/ML IJ SOLN: 10.0000 mg | Freq: Once | INTRAMUSCULAR | Status: DC

## 2016-02-21 MED ORDER — ACETAMINOPHEN 325 MG PO TABS: 650.0000 mg | ORAL_TABLET | Freq: Four times a day (QID) | ORAL | Status: DC | PRN

## 2016-02-21 MED ORDER — ATORVASTATIN CALCIUM 40 MG PO TABS
40.0000 mg | ORAL_TABLET | Freq: Every day | ORAL | Status: DC
  Administered 2016-02-21 – 2016-02-24 (×4): 40 mg via ORAL
  Filled 2016-02-21 (×4): qty 1

## 2016-02-21 MED ORDER — HYDROCORTISONE NA SUCCINATE PF 100 MG IJ SOLR
50.0000 mg | Freq: Four times a day (QID) | INTRAMUSCULAR | Status: DC
  Administered 2016-02-21 (×3): 50 mg via INTRAVENOUS
  Administered 2016-02-22: 100 mg via INTRAVENOUS
  Administered 2016-02-22: 50 mg via INTRAVENOUS
  Filled 2016-02-21 (×5): qty 2

## 2016-02-21 MED ORDER — AMIODARONE HCL 200 MG PO TABS
200.0000 mg | ORAL_TABLET | Freq: Every day | ORAL | Status: DC
  Administered 2016-02-21 – 2016-02-25 (×5): 200 mg via ORAL
  Filled 2016-02-21 (×5): qty 1

## 2016-02-21 MED ORDER — ASPIRIN EC 81 MG PO TBEC
81.0000 mg | DELAYED_RELEASE_TABLET | Freq: Every day | ORAL | Status: DC
  Administered 2016-02-21 – 2016-02-25 (×5): 81 mg via ORAL
  Filled 2016-02-21 (×5): qty 1

## 2016-02-21 MED ORDER — DOXERCALCIFEROL 4 MCG/2ML IV SOLN
4.0000 ug | INTRAVENOUS | Status: DC
  Administered 2016-02-22 – 2016-02-25 (×2): 4 ug via INTRAVENOUS
  Filled 2016-02-21 (×2): qty 2

## 2016-02-21 MED ORDER — SACCHAROMYCES BOULARDII 250 MG PO CAPS
250.0000 mg | ORAL_CAPSULE | Freq: Two times a day (BID) | ORAL | Status: DC
  Administered 2016-02-21 – 2016-02-25 (×9): 250 mg via ORAL
  Filled 2016-02-21 (×10): qty 1

## 2016-02-21 MED ORDER — ALLOPURINOL 100 MG PO TABS
100.0000 mg | ORAL_TABLET | Freq: Two times a day (BID) | ORAL | Status: DC
  Administered 2016-02-21 – 2016-02-25 (×9): 100 mg via ORAL
  Filled 2016-02-21 (×9): qty 1

## 2016-02-21 MED ORDER — SEVELAMER CARBONATE 800 MG PO TABS
800.0000 mg | ORAL_TABLET | Freq: Three times a day (TID) | ORAL | Status: DC
  Administered 2016-02-21 – 2016-02-25 (×12): 800 mg via ORAL
  Filled 2016-02-21 (×14): qty 1

## 2016-02-21 MED ORDER — ONDANSETRON HCL 4 MG PO TABS: 4.0000 mg | ORAL_TABLET | Freq: Four times a day (QID) | ORAL | Status: DC | PRN

## 2016-02-21 MED ORDER — ACETAMINOPHEN 650 MG RE SUPP: 650.0000 mg | Freq: Four times a day (QID) | RECTAL | Status: DC | PRN

## 2016-02-21 MED ORDER — ONDANSETRON HCL 4 MG/2ML IJ SOLN: 4.0000 mg | Freq: Four times a day (QID) | INTRAMUSCULAR | Status: DC | PRN

## 2016-02-21 MED ORDER — HEPARIN SODIUM (PORCINE) 5000 UNIT/ML IJ SOLN
5000.0000 [IU] | Freq: Three times a day (TID) | INTRAMUSCULAR | Status: DC
  Administered 2016-02-21 – 2016-02-25 (×12): 5000 [IU] via SUBCUTANEOUS
  Filled 2016-02-21 (×10): qty 1

## 2016-02-21 NOTE — H&P (Signed)
History and Physical    Chad [name] HWT:[id number] DOB: [birth year] DOA: 02/20/2016  PCP: Ulla Potash., MD  Patient coming from: Home.  Chief Complaint: Hypotension. Abdominal discomfort.  HPI: Chad [name] is a 61 y.o. male with history of ESRD on HD (MWF), NICM but recent EF in 2015 improved to 60%. Paroxysmal Atrial Flutter not on anticoagulation secondary to GI Bleed was brought to the ER because of persistent hypotension and abdominal discomfort. Patient usually gets hypotensive after dialysis but this time it was protracted. Since patient complained of lower quadrant abdominal discomfort and cough, patient had CT Chest and Abdomen and Pelvis with contrast which did not show anything acute. Influenza PCR was negative. Patient after twenty years ws taken off prednisone last week.  ED Course: Blood cultures were obtained, along with lactic acid, cortisol levels and troponin. EKG was showing non specific ST depression in anterior leads. PCCM was consulted. PCCM advised to give fluids and a total of three litre's were given. Empiric antibiotics started. On my exam patient denies any chest pain abdominal pain, nausea vomiting diarrhea productive cough.  Review of Systems: As per HPI, rest all negative.   Past Medical History:  Diagnosis Date  . Atrial flutter ([place])    takes Amiodarone daily  . CKD (chronic kidney disease), stage IV ([place])    a. s/p renal transplant 1996.  Marland Kitchen GERD (gastroesophageal reflux disease)    takes Protonix daily  . Gout    takes Allopurinol daily  . Hepatitis C 1996   Hep C  . History of bronchitis    several yrs ago  . History of colon polyps   . History of echocardiogram    Echo 12/17: Mild LVH, EF 55-60, inferolateral and inferior akinesis, grade 1 diastolic dysfunction, moderate aortic stenosis (mean 20, peak 32), mild AI, mildly dilated ascending aorta, MAC, mild LAE, mildly increased PASP  . History of kidney stones   . History of shingles   .  HLD (hyperlipidemia)    takes Pravastatin daily  . Hyperparathyroidism due to renal insufficiency ([place])   . Hypertension    takes Amlodipine,Hydralazine,Imdur,and Coreg daily  . Joint pain    right shoulder and left knee  . Nonischemic cardiomyopathy ([place])    a. unknown etiology, EF 30-35% by echo 09/2011;  b. 09/2011 Lexi MV EF 42%, no ischemia/infarct.  . Pneumonia 2014  . Sciatica   . Shortness of breath dyspnea    exertion  . Sleep apnea    does not use cpap  . Systolic CHF ([place])    takes Lasix daily  . Urinary frequency   . Wound of left leg     Past Surgical History:  Procedure Laterality Date  . AV FISTULA PLACEMENT Left 06/01/2014   Procedure: LEFT BRACHIOCEPHALIC ARTERIOVENOUS (AV) FISTULA CREATION;  Surgeon: Mal Misty, MD;  Location: [place];  Service: Vascular;  Laterality: Left;  . colonsocopy    . FEMUR FRACTURE SURGERY Left 07/09/2012   Dr Sherrian Divers  . FEMUR IM NAIL Left 07/10/2012   ORIF L distal femur;  Surgeon: Wylene Simmer, MD;  Location: [place];  Service: Orthopedics;  Laterality: Left;  . HARDWARE REMOVAL Left 03/14/2014   Procedure: HARDWARE REMOVAL LEFT;  Surgeon: Altamese Morehouse, MD;  Location: [place];  Service: Orthopedics;  Laterality: Left;  . HIP SURGERY Left    replacement  . I&D EXTREMITY Left 08/24/2012   Procedure: LEFT LEG IRRIGATION AND DEBRIDEMENT WITH POSSIBLE WOUND VAC APPLICATION;  Surgeon:  Wylene Simmer, MD;  Location: [place];  Service: Orthopedics;  Laterality: Left;  . I&D EXTREMITY Left 06/23/2014   Procedure: IRRIGATION AND DEBRIDEMENT EXTREMITY;  Surgeon: Altamese Fredonia, MD;  Location: [place];  Service: Orthopedics;  Laterality: Left;  . INCISION AND DRAINAGE Left 06/08/2014   Procedure: INCISION AND DRAINAGE OF LEFT THIGH;  Surgeon: Altamese Maitland, MD;  Location: [place];  Service: Orthopedics;  Laterality: Left;  . INSERTION OF DIALYSIS CATHETER  06/20/2011   Procedure: INSERTION OF DIALYSIS CATHETER;  Surgeon: Serafina Mitchell, MD;  Location: [place];   Service: Vascular;  Laterality: Right;  Ultrasound guided insertion of right internal jugular dialysis catheter  . JOINT REPLACEMENT     left hip  . MULTIPLE EXTRACTIONS WITH ALVEOLOPLASTY  06/27/2011   Procedure: MULTIPLE EXTRACION WITH ALVEOLOPLASTY;  Surgeon: Lenn Cal, DDS;  Location: [place];  Service: Oral Surgery;  Laterality: N/A;  Extraction  of tooth # 14 with alveoloplasty  . NEPHRECTOMY TRANSPLANTED ORGAN Right 76yr ago  . NON UNION Left 03/14/2014   LEFT FEMER REPAIR     BY DR HANDY  . TRACHEOSTOMY  2014  . TRACHEOSTOMY CLOSURE  2014   2 wks after it was done  . WOUND DEBRIDEMENT Left 03/14/2014   Procedure: DEBRIDEMENT WOUND;  Surgeon: MAltamese La Villita MD;  [place]  Service: Orthopedics;  Laterality: Left;  INSERTION OF  ANTIBIOTIC Beads  (2 strands: 15 beads and 14 beads)     reports that he has never smoked. He has never used smokeless tobacco. He reports that he does not drink alcohol or use drugs.  No Known Allergies  Family History  Problem Relation Age of Onset  . Heart disease Neg Hx     Prior to Admission medications   Medication Sig Start Date End Date Taking? Authorizing Provider  allopurinol (ZYLOPRIM) 100 MG tablet Take 1 tablet (100 mg total) by mouth 2 (two) times daily. Patient taking differently: Take 100 mg by mouth daily.  07/16/15  Yes DJolaine Artist MD  amiodarone (PACERONE) 200 MG tablet TAKE 1 TABLET BY MOUTH EVERY DAY 02/15/16  Yes DLarey Dresser MD  aspirin EC 81 MG tablet Take 81 mg by mouth daily.   Yes Historical Provider, MD  atorvastatin (LIPITOR) 40 MG tablet Take 1 tablet (40 mg total) by mouth at bedtime. 10/25/15  Yes DJolaine Artist MD  carvedilol (COREG) 3.125 MG tablet Take 1 tablet (3.125 mg total) by mouth 2 (two) times daily. 07/16/15  Yes DShaune PascalBensimhon, MD  midodrine (PROAMATINE) 5 MG tablet Take 10 mg by mouth daily as needed. For HD 12/31/15  Yes Historical Provider, MD  saccharomyces boulardii (FLORASTOR)  250 MG capsule Take 1 capsule (250 mg total) by mouth 2 (two) times daily. Patient taking differently: Take 250 mg by mouth daily.  06/19/14  Yes KAinsley Spinner PA-C  sevelamer carbonate (RENVELA) 800 MG tablet Take 800 mg by mouth 3 (three) times daily with meals.   Yes Historical Provider, MD  calcium carbonate, dosed in mg elemental calcium, 1250 (500 CA) MG/5ML Take 15 mLs (1,500 mg of elemental calcium total) by mouth every 6 (six) hours as needed for indigestion. 06/19/14   KAinsley Spinner PA-C    Physical Exam: Vitals:   02/21/16 0130 02/21/16 0200 02/21/16 0230 02/21/16 0300  BP: 94/72 (!) 84/62 (!) 72/47 (!) 77/49  Pulse: 80 77 79 81  Resp: 22 26 26  (!) 27  SpO2: 96% 93% 96% 96%  Weight:      Height:          Constitutional: Moderately built and nourished. Vitals:   02/21/16 0130 02/21/16 0200 02/21/16 0230 02/21/16 0300  BP: 94/72 (!) 84/62 (!) 72/47 (!) 77/49  Pulse: 80 77 79 81  Resp: 22 26 26  (!) 27  SpO2: 96% 93% 96% 96%  Weight:      Height:       Eyes: anicteric no pallor. ENMT: No discharge from ears eyes nose or mouth. Neck: No JVD appreciated. No mass felt. Respiratory: No rhonchi or crepitations. Cardiovascular: S1S2 heard. No murmurs appreciated. Abdomen: Soft nontender BS present. Musculoskeletal: No edema. Skin: No rash. Neurologic:Alert awake oriented to time place and person. Moves all extremities. Psychiatric: Appears normal.   Labs on Admission: I have personally reviewed following labs and imaging studies  CBC:  Recent Labs Lab 02/20/16 1556 02/21/16 0156  WBC 7.0 6.3  NEUTROABS 4.2 3.8  HGB 14.6 12.6*  HCT 44.6 40.3  MCV 98.0 99.5  PLT 128* PENDING   Basic Metabolic Panel:  Recent Labs Lab 02/20/16 1556  NA 132*  K 4.1  CL 93*  CO2 26  GLUCOSE 73  BUN 11  CREATININE 3.91*  CALCIUM 8.5*   GFR: Estimated Creatinine Clearance: 25 mL/min (by C-G formula based on SCr of 3.91 mg/dL (H)). Liver Function Tests:  Recent Labs Lab  02/20/16 1556  AST 18  ALT 8*  ALKPHOS 85  BILITOT 1.4*  PROT 7.9  ALBUMIN 2.9*   No results for input(s): LIPASE, AMYLASE in the last 168 hours. No results for input(s): AMMONIA in the last 168 hours. Coagulation Profile: No results for input(s): INR, PROTIME in the last 168 hours. Cardiac Enzymes: No results for input(s): CKTOTAL, CKMB, CKMBINDEX, TROPONINI in the last 168 hours. BNP (last 3 results) No results for input(s): PROBNP in the last 8760 hours. HbA1C: No results for input(s): HGBA1C in the last 72 hours. CBG: No results for input(s): GLUCAP in the last 168 hours. Lipid Profile: No results for input(s): CHOL, HDL, LDLCALC, TRIG, CHOLHDL, LDLDIRECT in the last 72 hours. Thyroid Function Tests: No results for input(s): TSH, T4TOTAL, FREET4, T3FREE, THYROIDAB in the last 72 hours. Anemia Panel: No results for input(s): VITAMINB12, FOLATE, FERRITIN, TIBC, IRON, RETICCTPCT in the last 72 hours. Urine analysis:    Component Value Date/Time   COLORURINE YELLOW 03/18/2014 1730   APPEARANCEUR CLEAR 03/18/2014 1730   LABSPEC 1.013 03/18/2014 1730   PHURINE 5.0 03/18/2014 1730   GLUCOSEU NEGATIVE 03/18/2014 1730   HGBUR NEGATIVE 03/18/2014 1730   BILIRUBINUR NEGATIVE 03/18/2014 1730   KETONESUR NEGATIVE 03/18/2014 1730   PROTEINUR 30 (A) 03/18/2014 1730   UROBILINOGEN 0.2 03/18/2014 1730   NITRITE NEGATIVE 03/18/2014 1730   LEUKOCYTESUR NEGATIVE 03/18/2014 1730   Sepsis Labs: @LABRCNTIP (procalcitonin:4,lacticidven:4) )No results found for this or any previous visit (from the past 240 hour(s)).   Radiological Exams on Admission: Dg Chest 2 View  Result Date: 02/20/2016 CLINICAL DATA:  Non productive cough. Hypotension and right lower quadrant abdominal discomfort following dialysis. No history of chills or nausea vomiting or diarrhea. EXAM: CHEST  2 VIEW COMPARISON:  Portable chest x-ray of Jun 23, 2014 and PA and lateral chest x-ray of March 06, 2014. FINDINGS:  The lungs are hypoinflated. There are coarse lung markings at the left lung base which are more conspicuous than on the most recent lateral radiograph of March 06, 2014. The cardiac silhouette is enlarged. The central pulmonary vascularity is  mildly engorged. There is calcification in the wall of the aortic arch. There is no definite pleural effusion. There is prominent thoracic kyphosis with loss of height of multiple lower thoracic vertebral bodies. IMPRESSION: Left basilar atelectasis or pneumonia possibly superimposed upon pre-existing scarring. Cardiomegaly with mild central pulmonary vascular prominence consistent with low-grade CHF. These findings are accentuated by hypoinflation. Thoracic aortic atherosclerosis. Electronically Signed   By: David  Martinique M.D.   On: 02/20/2016 16:41   Ct Chest Wo Contrast  Result Date: 02/20/2016 CLINICAL DATA:  61 year old male with possible pneumonia. EXAM: CT CHEST WITHOUT CONTRAST TECHNIQUE: Multidetector CT imaging of the chest was performed following the standard protocol without IV contrast. COMPARISON:  Chest radiograph dated 02/20/2016 FINDINGS: Evaluation of this exam is limited in the absence of intravenous contrast. Cardiovascular: There is mild cardiomegaly. No pericardial effusion. There is advanced multi vessel coronary vascular calcification involving the LAD, RCA, and left circumflex artery. There is mild atherosclerotic calcification of the thoracic aorta. No aneurysmal dilatation. There is mild prominence of the main pulmonary trunk suggestive of a degree of pulmonary hypertension. Mediastinum/Nodes: There is no hilar adenopathy. Two well-circumscribed adjacent ovoid soft tissue densities in the anterior mediastinum measuring up to 2.3 cm individually and a combined diameter of approximately 2.7 x 3.0 cm noted. These are indeterminate but may represent mildly enlarged lymph nodes, ectopic thyroid tissue, or other anterior mediastinal mass. These  lesions were seen on the CT of 03/30/2009 and demonstrate minimal interval increase in size suggestive of a benign etiology. Clinical correlation is recommended. Lungs/Pleura: There are bibasilar predominantly linear and streaky densities, likely combination of atelectasis and scarring. Infiltrate is less likely but not excluded. Clinical correlation is recommended. There is no pleural effusion or pneumothorax. The central airways are patent. Upper Abdomen: There is stone within the neck of the gallbladder. No pericholecystic fluid. Right renal atrophy. The upper abdomen is otherwise unremarkable. Musculoskeletal: Osteopenia with degenerative changes of the spine. No acute fracture. IMPRESSION: Bibasilar linear and streaky densities most likely atelectasis/scarring. Infiltrate is less likely but not excluded. Clinical correlation is recommended. Well-circumscribed bilobed soft tissue density in the anterior mediastinum with minimal interval change in size compared to the CT of 2011 likely of benign etiology. Mild cardiomegaly.  Multivessel coronary vascular disease. Electronically Signed   By: Anner Crete M.D.   On: 02/20/2016 21:42   Ct Abdomen Pelvis W Contrast  Result Date: 02/20/2016 CLINICAL DATA:  Mid abdomen pain after dialysis EXAM: CT ABDOMEN AND PELVIS WITH CONTRAST TECHNIQUE: Multidetector CT imaging of the abdomen and pelvis was performed using the standard protocol following bolus administration of intravenous contrast. CONTRAST:  155m ISOVUE-300 IOPAMIDOL (ISOVUE-300) INJECTION 61% COMPARISON:  CT abdomen and pelvis of 08/09/2011 FINDINGS: Lower chest: There is linear atelectasis at both lung bases right greater than left. No pleural effusion is seen. There is cardiomegaly present with calcification noted of the mitral and aortic valves. No pericardial effusion is seen. Coronary artery calcifications are noted diffusely. Hepatobiliary: The liver is unremarkable although linear artifacts are  created by the patient's arms overlying the patient's sides limiting detail. No calcified gallstones are seen. Pancreas: The pancreas is normal in size and the pancreatic duct is not dilated. Spleen: The spleen is unremarkable. Adrenals/Urinary Tract: The adrenal glands appear normal. The kidneys are very atrophic with no evidence of hydronephrosis. Considerable renal artery calcifications are noted bilaterally. The ureters are normal in caliber. Transplanted kidney in the right pelvis is unremarkable with no hydronephrosis noted. A low-attenuation  structure medially and anteriorly within the transplanted kidney is unchanged compared to the prior CT. On delayed images no contrast is seen to be excreted by the transplanted right pelvic kidney. Urinary bladder is decompressed and cannot be well evaluated. Stomach/Bowel: The stomach is not well distended but no abnormality is evident. No small bowel distention is seen. There are scattered rectosigmoid colon diverticula present. No diverticulitis is noted. The colon is largely decompressed. Vascular/Lymphatic: The abdominal aorta is normal in caliber with advanced abdominal aortic atherosclerotic change present. No adenopathy is seen. Reproductive: The prostate is normal in size. Other: A small umbilical hernia is present containing only fat. Musculoskeletal: The lumbar vertebrae are in normal alignment with degenerative change in the lower thoracic and upper lumbar spine. Left total hip replacement is present. IMPRESSION: 1. No explanation for the patient's mid abdominal pain is seen. 2. No contrast is seen to be excreted by the transplanted right pelvic kidney. The native kidneys appear atrophic. No hydronephrosis is seen. 3. Coronary artery calcifications. Significant abdominal aortic atherosclerotic change. Electronically Signed   By: Ivar Drape M.D.   On: 02/20/2016 17:09    EKG: Independently reviewed. NSR. ST depression in anterior  leads.  Assessment/Plan Principal Problem:   Hypotension Active Problems:   Dilated cardiomyopathy (HCC)   OSA (obstructive sleep apnea)   PAF (paroxysmal atrial fibrillation) (HCC)   ESRD on dialysis ([place])    1. Hypotension - cause not clear. Since steroids were stopped last week after many years suspect adrenal insufficiency. Started on stress dose steroids. Will continue antibiotics until cultures available. Cycle cardiac markers and check 2D ECHO due to non specific ST changes in anterior leads. Pending cortisol levels. 2. Paroxysmal Atrial flutter - now in sins rhythm. Contine amiodarone. Not a candidate for anticoagulation due to history of GI bleed. 3. History of CHF/NICM - last EF 60%. Fluid management per nephrology. 4. ESRD on HD - MWF. Patient had received 3 litres of fluid. Closely observe respiratory status. Consult Nephrology for dialysis. 5. Anterior mediastinal density - further work up as outpatient. 6. Thrombocytopenia - Chronic. Follow CBC.   DVT prophylaxis: hEPARIN. Code Status: Fullcode.  Family Communication: Discussed with patient.  Disposition Plan: Home.  Consults called: None.EDP discussed with PCCM.  Admission status: Observation.    Rise Patience MD [name] Pager [phone].  If 7PM-7AM, please contact night-coverage [email or web address] Password [facility]  02/21/2016, 3:51 AM

## 2016-02-21 NOTE — Progress Notes (Addendum)
PROGRESS NOTE    Chad [name]  NFA:[id number] DOB: [birth year] DOA: 02/20/2016 PCP: [name], MD  Brief Narrative: 61/M with with a history of nonischemic cardiomyopathy Ef 15-20%, paroxysmal atrial flutter not on anticoagulation secondary to GI bleed, ESRD (MWF) with failed renal transplant (1996), hepatitis C, and H/o Femur fracture, s/o multiple surgeries then had osteomyelitis, s/p Abx therapy last year, now wheel chair bound presented to the ED with Hypotension and abd pain after HD. Pt reports that prednisone weaned down to 5mg  and not refilled recently but had a few left which he was taking . S/p close to 4L in ER, mentation at baseline. Ct Chest abd/pelvis unremarkable  Assessment & Plan:    Hypotension -suspect multifactorial, has cardiomyopathy with EF of 15-20%, ESRD with fluid removal, chronically on midodrine -also suspect component of AI, given chronic prednisone use with recently discontinuation. -Random cortisol 5.9 -agree with stress dose steroids, holding Coreg -empiric IV Vanc/Zosyn, pending cultures, stop this is in 24-48h if cultures negative -CT chest/abd pelvis negative for source of infection, FLu negative, no fevers  NICM/ Dilated cardiomyopathy EF of 15-20% -mildly elevated troponin  In setting of hypotension, clinically no evidence of ACS -check ECHO -holding coreg, volume status managed with HD   OSA (obstructive sleep apnea)  -Paroxysmal Atrial flutter  -NSR now, contine amiodarone.  -not on anticoagulation due to history of GI bleed.    ESRD on dialysis (HCC) -on HD MWF, last HD yesterday, now s/p 4L IVF  Anterior mediastinal density - further work up as outpatient.  Thrombocytopenia - Chronic. Follow CBC.  DVT prophylaxis: Heparin Code Status: Fullcode.  Family Communication: None at bedside Disposition Plan: Tx to Tele when BP stable and improved  Consultants:   Renal  Antimicrobials: Vanc/Zosyn 1/25  Subjective: Feels ok, a  little weak though, no abd pain  Objective: Vitals:   02/21/16 0627 02/21/16 0700 02/21/16 0800 02/21/16 0900  BP: (!) 90/56 (!) 91/59 (!) 91/58 (!) 86/56  Pulse: 83 77 80 77  Resp: (!) 22 (!) 21 (!) 25 (!) 28  Temp:   98.4 F (36.9 C)   TempSrc:   Oral   SpO2: 95% 99% 93% 100%  Weight:      Height:        Intake/Output Summary (Last 24 hours) at 02/21/16 1038 Last data filed at 02/21/16 0224  Gross per 24 hour  Intake             3750 ml  Output                0 ml  Net             3750 ml   Filed Weights   02/20/16 1536 02/21/16 0500  Weight: 97.1 kg (214 lb) 104.2 kg (229 lb 11.5 oz)    Examination:  General exam: Appears calm and comfortable, AAOX3, No distress Respiratory system: Clear to auscultation. Respiratory effort normal. Cardiovascular system: S1 & S2 heard, RRR. No JVD, murmurs, rubs, gallops or clicks. Gastrointestinal system: Abdomen is nondistended, soft and nontender. Normal bowel sounds heard. Central nervous system: Alert and oriented. No focal neurological deficits. Extremities: L femur with deformity, L arm AVF Skin: No rashes, lesions or ulcers Psychiatry: Judgement and insight appear normal. Mood & affect appropriate.     Data Reviewed: I have personally reviewed following labs and imaging studies  CBC:  Recent Labs Lab 02/20/16 1556 02/21/16 0156 02/21/16 0502  WBC 7.0 6.3 7.2  NEUTROABS 4.2  3.8 5.0  HGB 14.6 12.6* 13.3  HCT 44.6 40.3 42.2  MCV 98.0 99.5 99.3  PLT 128* 112* 107*   Basic Metabolic Panel:  Recent Labs Lab 02/20/16 1556 02/21/16 0502  NA 132* 135  K 4.1 4.4  CL 93* 100*  CO2 26 24  GLUCOSE 73 86  BUN 11 12  CREATININE 3.91* 4.57*  CALCIUM 8.5* 8.4*   GFR: Estimated Creatinine Clearance: 22.1 mL/min (by C-G formula based on SCr of 4.57 mg/dL (H)). Liver Function Tests:  Recent Labs Lab 02/20/16 1556 02/21/16 0502  AST 18 15  ALT 8* 5*  ALKPHOS 85 66  BILITOT 1.4* 0.9  PROT 7.9 6.7  ALBUMIN 2.9*  2.2*   No results for input(s): LIPASE, AMYLASE in the last 168 hours. No results for input(s): AMMONIA in the last 168 hours. Coagulation Profile: No results for input(s): INR, PROTIME in the last 168 hours. Cardiac Enzymes:  Recent Labs Lab 02/21/16 0156 02/21/16 0746  TROPONINI 0.13* 0.18*   BNP (last 3 results) No results for input(s): PROBNP in the last 8760 hours. HbA1C: No results for input(s): HGBA1C in the last 72 hours. CBG: No results for input(s): GLUCAP in the last 168 hours. Lipid Profile: No results for input(s): CHOL, HDL, LDLCALC, TRIG, CHOLHDL, LDLDIRECT in the last 72 hours. Thyroid Function Tests: No results for input(s): TSH, T4TOTAL, FREET4, T3FREE, THYROIDAB in the last 72 hours. Anemia Panel: No results for input(s): VITAMINB12, FOLATE, FERRITIN, TIBC, IRON, RETICCTPCT in the last 72 hours. Urine analysis:    Component Value Date/Time   COLORURINE YELLOW 03/18/2014 1730   APPEARANCEUR CLEAR 03/18/2014 1730   LABSPEC 1.013 03/18/2014 1730   PHURINE 5.0 03/18/2014 1730   GLUCOSEU NEGATIVE 03/18/2014 1730   HGBUR NEGATIVE 03/18/2014 1730   BILIRUBINUR NEGATIVE 03/18/2014 1730   KETONESUR NEGATIVE 03/18/2014 1730   PROTEINUR 30 (A) 03/18/2014 1730   UROBILINOGEN 0.2 03/18/2014 1730   NITRITE NEGATIVE 03/18/2014 1730   LEUKOCYTESUR NEGATIVE 03/18/2014 1730   Sepsis Labs: @LABRCNTIP (procalcitonin:4,lacticidven:4)  ) Recent Results (from the past 240 hour(s))  MRSA PCR Screening     Status: Abnormal   Collection Time: 02/21/16  6:20 AM  Result Value Ref Range Status   MRSA by PCR POSITIVE (A) NEGATIVE Final    Comment:        The GeneXpert MRSA Assay (FDA approved for NASAL specimens only), is one component of a comprehensive MRSA colonization surveillance program. It is not intended to diagnose MRSA infection nor to guide or monitor treatment for MRSA infections. RESULT CALLED TO, READ BACK BY AND VERIFIED WITH: Y. PINEDA [id number] 01.25.18  N. MORRIS          Radiology Studies: Dg Chest 2 View  Result Date: 02/20/2016 CLINICAL DATA:  Non productive cough. Hypotension and right lower quadrant abdominal discomfort following dialysis. No history of chills or nausea vomiting or diarrhea. EXAM: CHEST  2 VIEW COMPARISON:  Portable chest x-ray of Jun 23, 2014 and PA and lateral chest x-ray of March 06, 2014. FINDINGS: The lungs are hypoinflated. There are coarse lung markings at the left lung base which are more conspicuous than on the most recent lateral radiograph of March 06, 2014. The cardiac silhouette is enlarged. The central pulmonary vascularity is mildly engorged. There is calcification in the wall of the aortic arch. There is no definite pleural effusion. There is prominent thoracic kyphosis with loss of height of multiple lower thoracic vertebral bodies. IMPRESSION: Left basilar atelectasis or pneumonia possibly superimposed  upon pre-existing scarring. Cardiomegaly with mild central pulmonary vascular prominence consistent with low-grade CHF. These findings are accentuated by hypoinflation. Thoracic aortic atherosclerosis. Electronically Signed   By: David  [place] M.D.   On: 02/20/2016 16:41   Ct Chest Wo Contrast  Result Date: 02/20/2016 CLINICAL DATA:  61 year old male with possible pneumonia. EXAM: CT CHEST WITHOUT CONTRAST TECHNIQUE: Multidetector CT imaging of the chest was performed following the standard protocol without IV contrast. COMPARISON:  Chest radiograph dated 02/20/2016 FINDINGS: Evaluation of this exam is limited in the absence of intravenous contrast. Cardiovascular: There is mild cardiomegaly. No pericardial effusion. There is advanced multi vessel coronary vascular calcification involving the LAD, RCA, and left circumflex artery. There is mild atherosclerotic calcification of the thoracic aorta. No aneurysmal dilatation. There is mild prominence of the main pulmonary trunk suggestive of a degree of pulmonary  hypertension. Mediastinum/Nodes: There is no hilar adenopathy. Two well-circumscribed adjacent ovoid soft tissue densities in the anterior mediastinum measuring up to 2.3 cm individually and a combined diameter of approximately 2.7 x 3.0 cm noted. These are indeterminate but may represent mildly enlarged lymph nodes, ectopic thyroid tissue, or other anterior mediastinal mass. These lesions were seen on the CT of 03/30/2009 and demonstrate minimal interval increase in size suggestive of a benign etiology. Clinical correlation is recommended. Lungs/Pleura: There are bibasilar predominantly linear and streaky densities, likely combination of atelectasis and scarring. Infiltrate is less likely but not excluded. Clinical correlation is recommended. There is no pleural effusion or pneumothorax. The central airways are patent. Upper Abdomen: There is stone within the neck of the gallbladder. No pericholecystic fluid. Right renal atrophy. The upper abdomen is otherwise unremarkable. Musculoskeletal: Osteopenia with degenerative changes of the spine. No acute fracture. IMPRESSION: Bibasilar linear and streaky densities most likely atelectasis/scarring. Infiltrate is less likely but not excluded. Clinical correlation is recommended. Well-circumscribed bilobed soft tissue density in the anterior mediastinum with minimal interval change in size compared to the CT of 2011 likely of benign etiology. Mild cardiomegaly.  Multivessel coronary vascular disease. Electronically Signed   By: Elgie Collard M.D.   On: 02/20/2016 21:42   Ct Abdomen Pelvis W Contrast  Result Date: 02/20/2016 CLINICAL DATA:  Mid abdomen pain after dialysis EXAM: CT ABDOMEN AND PELVIS WITH CONTRAST TECHNIQUE: Multidetector CT imaging of the abdomen and pelvis was performed using the standard protocol following bolus administration of intravenous contrast. CONTRAST:  ISOVUE-300 IOPAMIDOL (ISOVUE-300) INJECTION 61% COMPARISON:  CT abdomen and  pelvis of 08/09/2011 FINDINGS: Lower chest: There is linear atelectasis at both lung bases right greater than left. No pleural effusion is seen. There is cardiomegaly present with calcification noted of the mitral and aortic valves. No pericardial effusion is seen. Coronary artery calcifications are noted diffusely. Hepatobiliary: The liver is unremarkable although linear artifacts are created by the patient's arms overlying the patient's sides limiting detail. No calcified gallstones are seen. Pancreas: The pancreas is normal in size and the pancreatic duct is not dilated. Spleen: The spleen is unremarkable. Adrenals/Urinary Tract: The adrenal glands appear normal. The kidneys are very atrophic with no evidence of hydronephrosis. Considerable renal artery calcifications are noted bilaterally. The ureters are normal in caliber. Transplanted kidney in the right pelvis is unremarkable with no hydronephrosis noted. A low-attenuation structure medially and anteriorly within the transplanted kidney is unchanged compared to the prior CT. On delayed images no contrast is seen to be excreted by the transplanted right pelvic kidney. Urinary bladder is decompressed and cannot be well evaluated. Stomach/Bowel:  The stomach is not well distended but no abnormality is evident. No small bowel distention is seen. There are scattered rectosigmoid colon diverticula present. No diverticulitis is noted. The colon is largely decompressed. Vascular/Lymphatic: The abdominal aorta is normal in caliber with advanced abdominal aortic atherosclerotic change present. No adenopathy is seen. Reproductive: The prostate is normal in size. Other: A small umbilical hernia is present containing only fat. Musculoskeletal: The lumbar vertebrae are in normal alignment with degenerative change in the lower thoracic and upper lumbar spine. Left total hip replacement is present. IMPRESSION: 1. No explanation for the patient's mid abdominal pain is seen. 2.  No contrast is seen to be excreted by the transplanted right pelvic kidney. The native kidneys appear atrophic. No hydronephrosis is seen. 3. Coronary artery calcifications. Significant abdominal aortic atherosclerotic change. Electronically Signed   By: Dwyane Dee M.D.   On: 02/20/2016 17:09        Scheduled Meds: . allopurinol  100 mg Oral BID  . amiodarone  200 mg Oral Daily  . aspirin EC  81 mg Oral Daily  . atorvastatin  40 mg Oral QHS  . heparin  5,000 Units Subcutaneous Q8H  . hydrocortisone sod succinate (SOLU-CORTEF) inj  50 mg Intravenous Q6H  . piperacillin-tazobactam (ZOSYN)  IV  3.375 g Intravenous Q12H  . saccharomyces boulardii  250 mg Oral BID  . sevelamer carbonate  800 mg Oral TID WC  . [START ON 02/22/2016] vancomycin  1,000 mg Intravenous Q M,W,F-HD   Continuous Infusions:   LOS: 0 days    Time spent:    Zannie Cove, MD [name] Pager [phone]  If 7PM-7AM, please contact night-coverage [email or web address] Password [facility] 02/21/2016, 10:38 AM

## 2016-02-21 NOTE — Progress Notes (Signed)
  Echocardiogram 2D Echocardiogram has been performed.  Janalyn Harder 02/21/2016, 11:36 AM

## 2016-02-21 NOTE — Consult Note (Signed)
[facility] Renal Consultation Note  Indication for Consultation:  Management of ESRD/hemodialysis; anemia, hypertension/volume and secondary hyperparathyroidism  HPI: Chad [name] is a 61 y.o. 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 Admitted after HD yesterday sec hypotension (in setting of long term Prednisone 54m q day (243yr >use for renal tx  and not refilled this week/ CM ) / Paroxysmal Atrial flutter now SR (is on amiodarone ) co abd pain  With CT chest and abd = no acute findings/ Influenza PCR  Neg . He denies any fevers  chest pain abdominal pain, nausea vomiting diarrhea [facility]  bp 161/132  Pre hd yest. And in ER 80/61a nd in 70s/ given  4 liters iv fluids and bp stabilizing now at 91/56  And symp now . Noted on Midorine 1090mre hd MWF and also Coreg 3.125 mg bid with HO NICM/ Dilated cardiomyopathy EF of 15-20%CM/ Random cortisol 5.9.  Currently  Feels better tolerated breakfast with no abd discomfort.     Past Medical History:  Diagnosis Date  . Atrial flutter ([facility]  takes Amiodarone daily  . CKD (chronic kidney disease), stage IV ([facility]  a. s/p renal transplant 1996.  . GMarland KitchenRD (gastroesophageal reflux disease)    takes Protonix daily  . Gout    takes Allopurinol daily  . Hepatitis C 1996   Hep C  . History of bronchitis    several yrs ago  . History of colon polyps   . History of echocardiogram    Echo 12/17: Mild LVH, EF 55-60, inferolateral and inferior akinesis, grade 1 diastolic dysfunction, moderate aortic stenosis (mean 20, peak 32), mild AI, mildly dilated ascending aorta, MAC, mild LAE, mildly increased PASP  . History of kidney stones   . History of shingles   . HLD (hyperlipidemia)    takes Pravastatin daily  .  Hyperparathyroidism due to renal insufficiency (HCC[place] . Hypertension    takes Amlodipine,Hydralazine,Imdur,and Coreg daily  . Joint pain    right shoulder and left knee  . Nonischemic cardiomyopathy (HCCWest [place]  a. unknown etiology, EF 30-35% by echo 09/2011;  b. 09/2011 Lexi MV EF 42%, no ischemia/infarct.  . Pneumonia 2014  . Sciatica   . Shortness of breath dyspnea    exertion  . Sleep apnea    does not use cpap  . Systolic CHF (HCCAur[place]  takes Lasix daily  . Urinary frequency   . Wound of left leg     Past Surgical History:  Procedure Laterality Date  . AV FISTULA PLACEMENT Left 06/01/2014   Procedure: LEFT BRACHIOCEPHALIC ARTERIOVENOUS (AV) FISTULA CREATION;  Surgeon: JamMal MistyD;  [facility]  Laterality: Left;  . colonsocopy    . FEMUR FRACTURE SURGERY Left 07/09/2012   Dr ChuSherrian Divers FEMUR IM NAIL Left 07/10/2012   ORIF L distal femur;  Surgeon: JohWylene SimmerD;  [facility];  Laterality: Left;  . HARDWARE REMOVAL Left 03/14/2014   Procedure: HARDWARE REMOVAL LEFT;  Surgeon: MicAltamese CarolinaD;  [facility];  Laterality: Left;  . HIP SURGERY Left    replacement  . I&D EXTREMITY Left 08/24/2012   Procedure: LEFT LEG IRRIGATION AND DEBRIDEMENT WITH POSSIBLE WOUND VAC APPLICATION;  Surgeon: Wylene Simmer, MD;  Location: [place];  Service: Orthopedics;  Laterality: Left;  . I&D EXTREMITY Left 06/23/2014   Procedure: IRRIGATION AND DEBRIDEMENT EXTREMITY;  Surgeon: Altamese Kachemak, MD;  Location: [place];  Service: Orthopedics;  Laterality: Left;  . INCISION AND DRAINAGE Left 06/08/2014   Procedure: INCISION AND DRAINAGE OF LEFT THIGH;  Surgeon: Altamese Franklintown, MD;  Location: [place];  Service: Orthopedics;  Laterality: Left;  . INSERTION OF DIALYSIS CATHETER  06/20/2011   Procedure: INSERTION OF DIALYSIS CATHETER;  Surgeon: Serafina Mitchell, MD;  Location: [place];  Service: Vascular;  Laterality: Right;  Ultrasound guided  insertion of right internal jugular dialysis catheter  . JOINT REPLACEMENT     left hip  . MULTIPLE EXTRACTIONS WITH ALVEOLOPLASTY  06/27/2011   Procedure: MULTIPLE EXTRACION WITH ALVEOLOPLASTY;  Surgeon: Lenn Cal, DDS;  Location: [place];  Service: Oral Surgery;  Laterality: N/A;  Extraction  of tooth # 14 with alveoloplasty  . NEPHRECTOMY TRANSPLANTED ORGAN Right 54yr ago  . NON UNION Left 03/14/2014   LEFT FEMER REPAIR     BY DR HANDY  . TRACHEOSTOMY  2014  . TRACHEOSTOMY CLOSURE  2014   2 wks after it was done  . WOUND DEBRIDEMENT Left 03/14/2014   Procedure: DEBRIDEMENT WOUND;  Surgeon: MAltamese Louisburg MD;  [place]  Service: Orthopedics;  Laterality: Left;  INSERTION OF  ANTIBIOTIC Beads  (2 strands: 15 beads and 14 beads)      Family History  Problem Relation Age of Onset  . Heart disease Neg Hx       reports that he has never smoked. He has never used smokeless tobacco. He reports that he does not drink alcohol or use drugs.  No Known Allergies  Prior to Admission medications   Medication Sig Start Date End Date Taking? Authorizing Provider  allopurinol (ZYLOPRIM) 100 MG tablet Take 1 tablet (100 mg total) by mouth 2 (two) times daily. Patient taking differently: Take 100 mg by mouth daily.  07/16/15  Yes DJolaine Artist MD  amiodarone (PACERONE) 200 MG tablet TAKE 1 TABLET BY MOUTH EVERY DAY 02/15/16  Yes DLarey Dresser MD  aspirin EC 81 MG tablet Take 81 mg by mouth daily.   Yes Historical Provider, MD  atorvastatin (LIPITOR) 40 MG tablet Take 1 tablet (40 mg total) by mouth at bedtime. 10/25/15  Yes DJolaine Artist MD  carvedilol (COREG) 3.125 MG tablet Take 1 tablet (3.125 mg total) by mouth 2 (two) times daily. 07/16/15  Yes DShaune PascalBensimhon, MD  midodrine (PROAMATINE) 5 MG tablet Take 10 mg by mouth daily as needed. For HD 12/31/15  Yes Historical Provider, MD  saccharomyces boulardii (FLORASTOR) 250 MG capsule Take 1 capsule (250 mg total) by mouth  2 (two) times daily. Patient taking differently: Take 250 mg by mouth daily.  06/19/14  Yes KAinsley Spinner PA-C  sevelamer carbonate (RENVELA) 800 MG tablet Take 800 mg by mouth 3 (three) times daily with meals.   Yes Historical Provider, MD  calcium carbonate, dosed in mg elemental calcium, 1250 (500 CA) MG/5ML Take 15 mLs (1,500 mg of elemental calcium total) by mouth every 6 (six) hours as needed for indigestion. 06/19/14   KAinsley Spinner PA-C    PFWY:OVZCHYIFOYDXA**OR** acetaminophen, ondansetron **OR** ondansetron ([facility] IV  Results for orders placed or performed during the hospital encounter of 02/20/16 (from the past 48 hour(s))  Comprehensive metabolic panel     Status: Abnormal  Collection Time: 02/20/16  3:56 PM  Result Value Ref Range   Sodium 132 (L) 135 - 145 mmol/L   Potassium 4.1 3.5 - 5.1 mmol/L   Chloride 93 (L) 101 - 111 mmol/L   CO2 26 22 - 32 mmol/L   Glucose, Bld 73 65 - 99 mg/dL   BUN 11 6 - 20 mg/dL   Creatinine, Ser 3.91 (H) 0.61 - 1.24 mg/dL   Calcium 8.5 (L) 8.9 - 10.3 mg/dL   Total Protein 7.9 6.5 - 8.1 g/dL   Albumin 2.9 (L) 3.5 - 5.0 g/dL   AST 18 15 - 41 U/L   ALT 8 (L) 17 - 63 U/L   Alkaline Phosphatase 85 38 - 126 U/L   Total Bilirubin 1.4 (H) 0.3 - 1.2 mg/dL   GFR calc non Af Amer 15 (L) >60 mL/min   GFR calc Af Amer 18 (L) >60 mL/min    Comment: (NOTE) The eGFR has been calculated using the CKD EPI equation. This calculation has not been validated in all clinical situations. eGFR's persistently <60 mL/min signify possible Chronic Kidney Disease.    Anion gap 13 5 - 15  CBC with Differential     Status: Abnormal   Collection Time: 02/20/16  3:56 PM  Result Value Ref Range   WBC 7.0 4.0 - 10.5 K/uL   RBC 4.55 4.22 - 5.81 MIL/uL   Hemoglobin 14.6 13.0 - 17.0 g/dL   HCT 44.6 39.0 - 52.0 %   MCV 98.0 78.0 - 100.0 fL   MCH 32.1 26.0 - 34.0 pg   MCHC 32.7 30.0 - 36.0 g/dL   RDW 17.3 (H) 11.5 - 15.5 %   Platelets 128 (L) 150 - 400 K/uL   Neutrophils  Relative % 60 %   Neutro Abs 4.2 1.7 - 7.7 K/uL   Lymphocytes Relative 22 %   Lymphs Abs 1.6 0.7 - 4.0 K/uL   Monocytes Relative 14 %   Monocytes Absolute 1.0 0.1 - 1.0 K/uL   Eosinophils Relative 4 %   Eosinophils Absolute 0.3 0.0 - 0.7 K/uL   Basophils Relative 0 %   Basophils Absolute 0.0 0.0 - 0.1 K/uL  I-Stat CG4 Lactic Acid, ED     Status: Abnormal   Collection Time: 02/20/16  4:13 PM  Result Value Ref Range   Lactic Acid, Venous 2.26 (HH) 0.5 - 1.9 mmol/L   Comment NOTIFIED PHYSICIAN   Influenza panel by PCR (type A & B)     Status: None   Collection Time: 02/20/16  6:21 PM  Result Value Ref Range   Influenza A By PCR NEGATIVE NEGATIVE   Influenza B By PCR NEGATIVE NEGATIVE    Comment: (NOTE) The Xpert Xpress Flu assay is intended as an aid in the diagnosis of  influenza and should not be used as a sole basis for treatment.  This  assay is FDA approved for nasopharyngeal swab specimens only. Nasal  washings and aspirates are unacceptable for Xpert Xpress Flu testing.   I-Stat CG4 Lactic Acid, ED  (not at  [facility])     Status: Abnormal   Collection Time: 02/20/16  7:02 PM  Result Value Ref Range   Lactic Acid, Venous 3.16 (HH) 0.5 - 1.9 mmol/L   Comment NOTIFIED PHYSICIAN   I-Stat CG4 Lactic Acid, ED     Status: Abnormal   Collection Time: 02/21/16  1:26 AM  Result Value Ref Range   Lactic Acid, Venous 2.41 (HH) 0.5 - 1.9 mmol/L  Comment NOTIFIED PHYSICIAN   Troponin I (q 6hr x 3)     Status: Abnormal   Collection Time: 02/21/16  1:56 AM  Result Value Ref Range   Troponin I 0.13 (HH) <0.03 ng/mL    Comment: CRITICAL RESULT CALLED TO, READ BACK BY AND VERIFIED WITH: CHRISCOE,C RN 02/21/2016 0544 JORDANS   Cortisol     Status: None   Collection Time: 02/21/16  1:56 AM  Result Value Ref Range   Cortisol, Plasma 5.9 ug/dL    Comment: (NOTE) AM    6.7 - 22.6 ug/dL PM   <10.0       ug/dL   CBC with Differential/Platelet     Status: Abnormal   Collection Time:  02/21/16  1:56 AM  Result Value Ref Range   WBC 6.3 4.0 - 10.5 K/uL   RBC 4.05 (L) 4.22 - 5.81 MIL/uL   Hemoglobin 12.6 (L) 13.0 - 17.0 g/dL   HCT 40.3 39.0 - 52.0 %   MCV 99.5 78.0 - 100.0 fL   MCH 31.1 26.0 - 34.0 pg   MCHC 31.3 30.0 - 36.0 g/dL   RDW 17.5 (H) 11.5 - 15.5 %   Platelets 112 (L) 150 - 400 K/uL    Comment: REPEATED TO VERIFY SPECIMEN CHECKED FOR CLOTS PLATELET COUNT CONFIRMED BY SMEAR    Neutrophils Relative % 59 %   Neutro Abs 3.8 1.7 - 7.7 K/uL   Lymphocytes Relative 22 %   Lymphs Abs 1.4 0.7 - 4.0 K/uL   Monocytes Relative 15 %   Monocytes Absolute 0.9 0.1 - 1.0 K/uL   Eosinophils Relative 4 %   Eosinophils Absolute 0.2 0.0 - 0.7 K/uL   Basophils Relative 1 %   Basophils Absolute 0.0 0.0 - 0.1 K/uL  Type and screen [place]     Status: None   Collection Time: 02/21/16  2:20 AM  Result Value Ref Range   ABO/RH(D) B POS    Antibody Screen NEG    Sample Expiration 02/24/2016   Comprehensive metabolic panel     Status: Abnormal   Collection Time: 02/21/16  5:02 AM  Result Value Ref Range   Sodium 135 135 - 145 mmol/L   Potassium 4.4 3.5 - 5.1 mmol/L   Chloride 100 (L) 101 - 111 mmol/L   CO2 24 22 - 32 mmol/L   Glucose, Bld 86 65 - 99 mg/dL   BUN 12 6 - 20 mg/dL   Creatinine, Ser 4.57 (H) 0.61 - 1.24 mg/dL   Calcium 8.4 (L) 8.9 - 10.3 mg/dL   Total Protein 6.7 6.5 - 8.1 g/dL   Albumin 2.2 (L) 3.5 - 5.0 g/dL   AST 15 15 - 41 U/L   ALT 5 (L) 17 - 63 U/L   Alkaline Phosphatase 66 38 - 126 U/L   Total Bilirubin 0.9 0.3 - 1.2 mg/dL   GFR calc non Af Amer 13 (L) >60 mL/min   GFR calc Af Amer 15 (L) >60 mL/min    Comment: (NOTE) The eGFR has been calculated using the CKD EPI equation. This calculation has not been validated in all clinical situations. eGFR's persistently <60 mL/min signify possible Chronic Kidney Disease.    Anion gap 11 5 - 15  CBC WITH DIFFERENTIAL     Status: Abnormal   Collection Time: 02/21/16  5:02 AM  Result  Value Ref Range   WBC 7.2 4.0 - 10.5 K/uL   RBC 4.25 4.22 - 5.81 MIL/uL   Hemoglobin  13.3 13.0 - 17.0 g/dL   HCT 42.2 39.0 - 52.0 %   MCV 99.3 78.0 - 100.0 fL   MCH 31.3 26.0 - 34.0 pg   MCHC 31.5 30.0 - 36.0 g/dL   RDW 17.5 (H) 11.5 - 15.5 %   Platelets 107 (L) 150 - 400 K/uL    Comment: CONSISTENT WITH PREVIOUS RESULT   Neutrophils Relative % 70 %   Neutro Abs 5.0 1.7 - 7.7 K/uL   Lymphocytes Relative 12 %   Lymphs Abs 0.9 0.7 - 4.0 K/uL   Monocytes Relative 15 %   Monocytes Absolute 1.1 (H) 0.1 - 1.0 K/uL   Eosinophils Relative 2 %   Eosinophils Absolute 0.2 0.0 - 0.7 K/uL   Basophils Relative 1 %   Basophils Absolute 0.0 0.0 - 0.1 K/uL  MRSA PCR Screening     Status: Abnormal   Collection Time: 02/21/16  6:20 AM  Result Value Ref Range   MRSA by PCR POSITIVE (A) NEGATIVE    Comment:        The GeneXpert MRSA Assay (FDA approved for NASAL specimens only), is one component of a comprehensive MRSA colonization surveillance program. It is not intended to diagnose MRSA infection nor to guide or monitor treatment for MRSA infections. RESULT CALLED TO, READ BACK BY AND VERIFIED WITH: Y. PINEDA 0850 01.25.18 N. MORRIS   Troponin I (q 6hr x 3)     Status: Abnormal   Collection Time: 02/21/16  7:46 AM  Result Value Ref Range   Troponin I 0.18 (HH) <0.03 ng/mL    Comment: CRITICAL VALUE NOTED.  VALUE IS CONSISTENT WITH PREVIOUSLY REPORTED AND CALLED VALUE.    ROS: see hpi  Physical Exam: Vitals:   02/21/16 1000 02/21/16 1100  BP: 91/67 (!) 90/54  Pulse: 73 82  Resp: 20 (!) 23  Temp:       General :alert Elderly Obes AAM  chronically ill appearing but talkative and OX3  HEENT: [place]  MMM,eomi Neck: supple no jvd Heart: RRR, No mur , rub or gallop Lungs: CTA  Abdomen: Obese, soft NT,ND Extremities: Trace bipedal edema/with L femur deformity remote surgery  Skin: no overt rash . Warm dry Neuro: alert ox3 moves all extrem Dialysis Access: LUA AVF pos bruit    Dialysis Orders: Center: [facility]  on MWF . EDW 101 HD Bath 2k, 2ca  Time 4hr 107mn Heparin 8000 bolus /2000 intermit.   LUA AVF     Hec 4 mcg IV/HD    No ESA      Other last op labs hgb 13.8  Ca 10.3 phos 3.7  pth 593   Assessment/Plan 1. ESRD -  HD mwf scheule 2. Hypotension - multiple factors - cm Ef 15-20% /adrnal insuf. Recent lon term Prednisone use  Stopped /= admit rx stress steroids  WElba Barman/ Midodirne  145mpre hd  3. Hypertension/volume  - vol okay received 4l in ER last pm  Raise op edw ?? 102 kg  4. Anemia  -  hgb 13.3 no esa fu hgb  5. Metabolic bone disease -   hec on hd  And binder= renvela  6. PAF - SR on exam - amiodarone / no anticoag sec gi bld ho  7.   DaErnest HaberPA-C [id number]/25/2018, 12:09 PM   Pt seen, examined and agree w A/P as above.  RoKelly SplinterD CaNewell Rubbermaidager [phone] 02/21/2016, 6:03 PM

## 2016-02-21 NOTE — Care Management Note (Signed)
Case Management Note  Patient Details  Name: Chad [name] MRN: [id number] Date of Birth: [birth year]  Subjective/Objective: Pt admitted on 02/20/16 with hypotension and PAF.  PTA, pt independent of ADLs, and from home; he has significant other.    He is active with [facility] for HHPT, and is on hemodialysis on MWF.                   Action/Plan: Pt will need resumption orders for [facility] services to continue.  Recommend PT/OT consults to determine home needs.  MD, please leave order for HHPT prior to dc in order for [facility] to continue as prior to admission.  Will follow progress.    Expected Discharge Date:                  Expected Discharge Plan:  Home w Home Health Services  In-House Referral:     Discharge planning Services  CM Consult  Post Acute Care Choice:  Resumption of Svcs/PTA Provider Choice offered to:     DME Arranged:    DME Agency:     HH Arranged:    HH Agency:  [facility]  Status of Service:  In process, will continue to follow  If discussed at Long Length of Stay Meetings, dates discussed:    Additional Comments:  Quintella Baton, RN, BSN  Trauma/Neuro ICU Case Manager [phone]

## 2016-02-21 NOTE — ED Notes (Signed)
ED Provider at bedside. 

## 2016-02-21 NOTE — ED Provider Notes (Signed)
1:30 AM  Assumed care from Dr. Deretha Emory.  Pt here with hypotension after dialysis. Did have some right-sided abdominal pain, cough and congestion. CT chest and CT of the abdomen and pelvis were unremarkable for acute abnormality. Not significantly anemic. Thought to be hypovolemic versus septic. Patient given broad-spectrum antibiotics. Critical care was consulted who recommended giving him his 30 mL/kg IV fluid bolus. Patient asymptomatic currently.  1:55 AM  Patient's blood pressure has improved but is still low for him many hours after [place]. Recommended admission. Discussed patient's case with hospitalist, Dr. Toniann Fail.  Recommend admission to step down, observation bed.  I will place holding orders per their request. Patient and family (if present) updated with plan. Care transferred to hospitalist service.  I reviewed all nursing notes, vitals, pertinent old records, EKGs, labs, imaging (as available).    Layla Maw Eulogio Requena, DO 02/21/16 [phone]

## 2016-02-21 NOTE — ED Provider Notes (Signed)
[facility] DEPT Provider Note   CSN: [id number] Arrival date & time: 02/20/16  1528     History   Chief Complaint Chief Complaint  Patient presents with  . Abdominal Pain    HPI Chad [name] is a 61 y.o. male.  Patient sent in by EMS following dialysis. Patient normally dialyzed Monday Wednesday and Friday. Following dialysis he had complaint of right lower quadrant abdominal pain and had hypotension. According to patient for the last 2 months his dialysis is attended with hypotension. Have not been able to figure out why. That usually improves over the next day. Patient's had a little bit of a cough and a little bit of congestion but no fevers no nausea vomiting or diarrhea. Has felt chilled no shortness of breath no chest pain. Dialysis graft is in the left arm. Patient has been receiving dialysis for the past 2 years prior that he did have the kidney transplant.      Past Medical History:  Diagnosis Date  . Atrial flutter ([place])    takes Amiodarone daily  . CKD (chronic kidney disease), stage IV ([place])    a. s/p renal transplant 1996.  Marland Kitchen GERD (gastroesophageal reflux disease)    takes Protonix daily  . Gout    takes Allopurinol daily  . Hepatitis C 1996   Hep C  . History of bronchitis    several yrs ago  . History of colon polyps   . History of echocardiogram    Echo 12/17: Mild LVH, EF 55-60, inferolateral and inferior akinesis, grade 1 diastolic dysfunction, moderate aortic stenosis (mean 20, peak 32), mild AI, mildly dilated ascending aorta, MAC, mild LAE, mildly increased PASP  . History of kidney stones   . History of shingles   . HLD (hyperlipidemia)    takes Pravastatin daily  . Hyperparathyroidism due to renal insufficiency ([place])   . Hypertension    takes Amlodipine,Hydralazine,Imdur,and Coreg daily  . Joint pain    right shoulder and left knee  . Nonischemic cardiomyopathy ([place])    a. unknown etiology, EF 30-35% by echo 09/2011;  b. 09/2011 Lexi MV  EF 42%, no ischemia/infarct.  . Pneumonia 2014  . Sciatica   . Shortness of breath dyspnea    exertion  . Sleep apnea    does not use cpap  . Systolic CHF ([place])    takes Lasix daily  . Urinary frequency   . Wound of left leg     Patient Active Problem List   Diagnosis Date Noted  . Aortic valvular stenosis 11/27/2015  . Laceration of left leg 06/23/2014  . Chronic multifocal osteomyelitis of left femur ([place]) 06/22/2014  . Acute blood loss anemia 06/12/2014  . ESRD on dialysis ([place]) 06/11/2014  . 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 Gout 03/22/2014  . Hepatitis C 03/22/2014  . HLD (hyperlipidemia)   . Hyperparathyroidism due to renal insufficiency ([place])   . Osteomyelitis ([place]) 03/14/2014  . Broken femur ([place])   . Skin lesion of right leg 06/27/2013  . Postoperative wound infection 08/25/2012  . Femur fracture, left ([place]) 07/16/2012  . Diarrhea 03/16/2012  . Hx of amiodarone therapy 03/13/2012  . Long term current use of systemic steroids 03/13/2012  . Pulmonary hypertension 03/13/2012  . CHF (congestive heart failure) ([place]) 12/08/2011  . Community acquired pneumonia 12/08/2011  . PAF (paroxysmal atrial fibrillation) ([place]) 12/08/2011  . History of renal transplant 12/08/2011  . HTN (hypertension) 12/08/2011  .  Respiratory failure with hypoxia ([place]) 12/08/2011  . CKD (chronic kidney disease) 11/18/2011  . OSA (obstructive sleep apnea) 11/13/2011  . Atrial flutter ([place]) 08/27/2011  . Ulcer of left lower leg ([place]) 08/27/2011  . Conjunctivitis, acute, left eye 08/27/2011  . Dilated cardiomyopathy ([place]) 06/10/2011  . Renal transplant disorder 06/05/2011    Past Surgical History:  Procedure Laterality Date  . AV FISTULA PLACEMENT Left 06/01/2014   Procedure: LEFT BRACHIOCEPHALIC ARTERIOVENOUS (AV) FISTULA CREATION;  Surgeon: Mal Misty, MD;  Location: [facility];  Service: Vascular;  Laterality: Left;  . colonsocopy    . FEMUR  FRACTURE SURGERY Left 07/09/2012   Dr Sherrian Divers  . FEMUR IM NAIL Left 07/10/2012   ORIF L distal femur;  Surgeon: Wylene Simmer, MD;  Location: [facility];  Service: Orthopedics;  Laterality: Left;  . HARDWARE REMOVAL Left 03/14/2014   Procedure: HARDWARE REMOVAL LEFT;  Surgeon: Altamese Kinsey, MD;  Location: [facility];  Service: Orthopedics;  Laterality: Left;  . HIP SURGERY Left    replacement  . I&D EXTREMITY Left 08/24/2012   Procedure: LEFT LEG IRRIGATION AND DEBRIDEMENT WITH POSSIBLE WOUND VAC APPLICATION;  Surgeon: Wylene Simmer, MD;  Location: [facility];  Service: Orthopedics;  Laterality: Left;  . I&D EXTREMITY Left 06/23/2014   Procedure: IRRIGATION AND DEBRIDEMENT EXTREMITY;  Surgeon: Altamese Alachua, MD;  Location: [facility];  Service: Orthopedics;  Laterality: Left;  . INCISION AND DRAINAGE Left 06/08/2014   Procedure: INCISION AND DRAINAGE OF LEFT THIGH;  Surgeon: Altamese [place] Borough, MD;  Location: [facility];  Service: Orthopedics;  Laterality: Left;  . INSERTION OF DIALYSIS CATHETER  06/20/2011   Procedure: INSERTION OF DIALYSIS CATHETER;  Surgeon: Serafina Mitchell, MD;  Location: [facility];  Service: Vascular;  Laterality: Right;  Ultrasound guided insertion of right internal jugular dialysis catheter  . JOINT REPLACEMENT     left hip  . MULTIPLE EXTRACTIONS WITH ALVEOLOPLASTY  06/27/2011   Procedure: MULTIPLE EXTRACION WITH ALVEOLOPLASTY;  Surgeon: Lenn Cal, DDS;  Location: [facility];  Service: Oral Surgery;  Laterality: N/A;  Extraction  of tooth # 14 with alveoloplasty  . NEPHRECTOMY TRANSPLANTED ORGAN Right 34yr ago  . NON UNION Left 03/14/2014   LEFT FEMER REPAIR     BY DR HANDY  . TRACHEOSTOMY  2014  . TRACHEOSTOMY CLOSURE  2014   2 wks after it was done  . WOUND DEBRIDEMENT Left 03/14/2014   Procedure: DEBRIDEMENT WOUND;  Surgeon: MAltamese Cavalero MD;  [place] [facility]  Service: Orthopedics;  Laterality: Left;  INSERTION OF  ANTIBIOTIC Beads  (2 strands: 15 beads and 14 beads)       Home Medications     Prior to Admission medications   Medication Sig Start Date End Date Taking? Authorizing Provider  allopurinol (ZYLOPRIM) 100 MG tablet Take 1 tablet (100 mg total) by mouth 2 (two) times daily. Patient taking differently: Take 100 mg by mouth daily.  07/16/15  Yes DJolaine Artist MD  amiodarone (PACERONE) 200 MG tablet TAKE 1 TABLET BY MOUTH EVERY DAY 02/15/16  Yes DLarey Dresser MD  aspirin EC 81 MG tablet Take 81 mg by mouth daily.   Yes Historical Provider, MD  atorvastatin (LIPITOR) 40 MG tablet Take 1 tablet (40 mg total) by mouth at bedtime. 10/25/15  Yes DJolaine Artist MD  carvedilol (COREG) 3.125 MG tablet Take 1 tablet (3.125 mg total) by mouth 2 (two) times daily. 07/16/15  Yes DJolaine Artist MD  midodrine (PROAMATINE) 5 MG  tablet Take 10 mg by mouth daily as needed. For HD 12/31/15  Yes Historical Provider, MD  saccharomyces boulardii (FLORASTOR) 250 MG capsule Take 1 capsule (250 mg total) by mouth 2 (two) times daily. Patient taking differently: Take 250 mg by mouth daily.  06/19/14  Yes Ainsley Spinner, PA-C  sevelamer carbonate (RENVELA) 800 MG tablet Take 800 mg by mouth 3 (three) times daily with meals.   Yes Historical Provider, MD  calcium carbonate, dosed in mg elemental calcium, 1250 (500 CA) MG/5ML Take 15 mLs (1,500 mg of elemental calcium total) by mouth every 6 (six) hours as needed for indigestion. 06/19/14   Ainsley Spinner, PA-C    Family History Family History  Problem Relation Age of Onset  . Heart disease Neg Hx     Social History Social History  Substance Use Topics  . Smoking status: Never Smoker  . Smokeless tobacco: Never Used  . Alcohol use No     Allergies   Patient has no known allergies.   Review of Systems Review of Systems  Constitutional: Negative for fever.  HENT: Positive for congestion.   Eyes: Negative for visual disturbance.  Respiratory: Positive for cough.   Cardiovascular: Negative for chest pain.  Gastrointestinal:  Positive for abdominal pain. Negative for diarrhea and nausea.  Musculoskeletal: Negative for back pain.  Allergic/Immunologic: Positive for immunocompromised state.  Neurological: Negative for headaches.  Hematological: Bruises/bleeds easily.  Psychiatric/Behavioral: Negative for confusion.     Physical Exam Updated Vital Signs BP (!) 84/60   Pulse 79   Resp (!) 27   Ht 6' 1"  (1.854 m)   Wt 97.1 kg   SpO2 99%   BMI 28.23 kg/m   Physical Exam  Constitutional: He is oriented to person, place, and time. He appears well-developed and well-nourished. No distress.  HENT:  Head: Normocephalic and atraumatic.  Mouth/Throat: Oropharynx is clear and moist.  Eyes: EOM are normal. Pupils are equal, round, and reactive to light.  Neck: Normal range of motion. Neck supple.  Cardiovascular: Normal rate, regular rhythm and normal heart sounds.   Pulmonary/Chest: Effort normal and breath sounds normal.  Abdominal: Soft. Bowel sounds are normal. There is no tenderness.  Musculoskeletal:  Dialysis graft left arm. Good thrill.  Neurological: He is alert and oriented to person, place, and time. No cranial nerve deficit or sensory deficit. He exhibits normal muscle tone. Coordination normal.  Skin: Skin is warm.  Nursing note and vitals reviewed.    ED Treatments / Results  Labs (all labs ordered are listed, but only abnormal results are displayed) Labs Reviewed  COMPREHENSIVE METABOLIC PANEL - Abnormal; Notable for the following:       Result Value   Sodium 132 (*)    Chloride 93 (*)    Creatinine, Ser 3.91 (*)    Calcium 8.5 (*)    Albumin 2.9 (*)    ALT 8 (*)    Total Bilirubin 1.4 (*)    GFR calc non Af Amer 15 (*)    GFR calc Af Amer 18 (*)    All other components within normal limits  CBC WITH DIFFERENTIAL/PLATELET - Abnormal; Notable for the following:    RDW 17.3 (*)    Platelets 128 (*)    All other components within normal limits  I-STAT CG4 LACTIC ACID, ED - Abnormal;  Notable for the following:    Lactic Acid, Venous 2.26 (*)    All other components within normal limits  I-STAT CG4 LACTIC ACID, ED -  Abnormal; Notable for the following:    Lactic Acid, Venous 3.16 (*)    All other components within normal limits  CULTURE, BLOOD (ROUTINE X 2)  CULTURE, BLOOD (ROUTINE X 2)  INFLUENZA PANEL BY PCR (TYPE A & B)  URINALYSIS, ROUTINE W REFLEX MICROSCOPIC  I-STAT CG4 LACTIC ACID, ED    EKG  EKG Interpretation  Date/Time:  Wednesday February 20 2016 18:35:21 EST Ventricular Rate:  84 PR Interval:    QRS Duration: 155 QT Interval:  450 QTC Calculation: 532 R Axis:   123 Text Interpretation:  Sinus rhythm Nonspecific intraventricular conduction delay Borderline ST depression, anterolateral leads Confirmed by Rogene Houston  MD, Sye Schroepfer [phone]) on 02/20/2016 6:38:45 PM       Radiology Dg Chest 2 View  Result Date: 02/20/2016 CLINICAL DATA:  Non productive cough. Hypotension and right lower quadrant abdominal discomfort following dialysis. No history of chills or nausea vomiting or diarrhea. EXAM: CHEST  2 VIEW COMPARISON:  Portable chest x-ray of Jun 23, 2014 and PA and lateral chest x-ray of March 06, 2014. FINDINGS: The lungs are hypoinflated. There are coarse lung markings at the left lung base which are more conspicuous than on the most recent lateral radiograph of March 06, 2014. The cardiac silhouette is enlarged. The central pulmonary vascularity is mildly engorged. There is calcification in the wall of the aortic arch. There is no definite pleural effusion. There is prominent thoracic kyphosis with loss of height of multiple lower thoracic vertebral bodies. IMPRESSION: Left basilar atelectasis or pneumonia possibly superimposed upon pre-existing scarring. Cardiomegaly with mild central pulmonary vascular prominence consistent with low-grade CHF. These findings are accentuated by hypoinflation. Thoracic aortic atherosclerosis. Electronically Signed   By:  David  Martinique M.D.   On: 02/20/2016 16:41   Ct Chest Wo Contrast  Result Date: 02/20/2016 CLINICAL DATA:  61 year old male with possible pneumonia. EXAM: CT CHEST WITHOUT CONTRAST TECHNIQUE: Multidetector CT imaging of the chest was performed following the standard protocol without IV contrast. COMPARISON:  Chest radiograph dated 02/20/2016 FINDINGS: Evaluation of this exam is limited in the absence of intravenous contrast. Cardiovascular: There is mild cardiomegaly. No pericardial effusion. There is advanced multi vessel coronary vascular calcification involving the LAD, RCA, and left circumflex artery. There is mild atherosclerotic calcification of the thoracic aorta. No aneurysmal dilatation. There is mild prominence of the main pulmonary trunk suggestive of a degree of pulmonary hypertension. Mediastinum/Nodes: There is no hilar adenopathy. Two well-circumscribed adjacent ovoid soft tissue densities in the anterior mediastinum measuring up to 2.3 cm individually and a combined diameter of approximately 2.7 x 3.0 cm noted. These are indeterminate but may represent mildly enlarged lymph nodes, ectopic thyroid tissue, or other anterior mediastinal mass. These lesions were seen on the CT of 03/30/2009 and demonstrate minimal interval increase in size suggestive of a benign etiology. Clinical correlation is recommended. Lungs/Pleura: There are bibasilar predominantly linear and streaky densities, likely combination of atelectasis and scarring. Infiltrate is less likely but not excluded. Clinical correlation is recommended. There is no pleural effusion or pneumothorax. The central airways are patent. Upper Abdomen: There is stone within the neck of the gallbladder. No pericholecystic fluid. Right renal atrophy. The upper abdomen is otherwise unremarkable. Musculoskeletal: Osteopenia with degenerative changes of the spine. No acute fracture. IMPRESSION: Bibasilar linear and streaky densities most likely  atelectasis/scarring. Infiltrate is less likely but not excluded. Clinical correlation is recommended. Well-circumscribed bilobed soft tissue density in the anterior mediastinum with minimal interval change in size compared to the  CT of 2011 likely of benign etiology. Mild cardiomegaly.  Multivessel coronary vascular disease. Electronically Signed   By: Anner Crete M.D.   On: 02/20/2016 21:42   Ct Abdomen Pelvis W Contrast  Result Date: 02/20/2016 CLINICAL DATA:  Mid abdomen pain after dialysis EXAM: CT ABDOMEN AND PELVIS WITH CONTRAST TECHNIQUE: Multidetector CT imaging of the abdomen and pelvis was performed using the standard protocol following bolus administration of intravenous contrast. CONTRAST:  175m ISOVUE-300 IOPAMIDOL (ISOVUE-300) INJECTION 61% COMPARISON:  CT abdomen and pelvis of 08/09/2011 FINDINGS: Lower chest: There is linear atelectasis at both lung bases right greater than left. No pleural effusion is seen. There is cardiomegaly present with calcification noted of the mitral and aortic valves. No pericardial effusion is seen. Coronary artery calcifications are noted diffusely. Hepatobiliary: The liver is unremarkable although linear artifacts are created by the patient's arms overlying the patient's sides limiting detail. No calcified gallstones are seen. Pancreas: The pancreas is normal in size and the pancreatic duct is not dilated. Spleen: The spleen is unremarkable. Adrenals/Urinary Tract: The adrenal glands appear normal. The kidneys are very atrophic with no evidence of hydronephrosis. Considerable renal artery calcifications are noted bilaterally. The ureters are normal in caliber. Transplanted kidney in the right pelvis is unremarkable with no hydronephrosis noted. A low-attenuation structure medially and anteriorly within the transplanted kidney is unchanged compared to the prior CT. On delayed images no contrast is seen to be excreted by the transplanted right pelvic kidney.  Urinary bladder is decompressed and cannot be well evaluated. Stomach/Bowel: The stomach is not well distended but no abnormality is evident. No small bowel distention is seen. There are scattered rectosigmoid colon diverticula present. No diverticulitis is noted. The colon is largely decompressed. Vascular/Lymphatic: The abdominal aorta is normal in caliber with advanced abdominal aortic atherosclerotic change present. No adenopathy is seen. Reproductive: The prostate is normal in size. Other: A small umbilical hernia is present containing only fat. Musculoskeletal: The lumbar vertebrae are in normal alignment with degenerative change in the lower thoracic and upper lumbar spine. Left total hip replacement is present. IMPRESSION: 1. No explanation for the patient's mid abdominal pain is seen. 2. No contrast is seen to be excreted by the transplanted right pelvic kidney. The native kidneys appear atrophic. No hydronephrosis is seen. 3. Coronary artery calcifications. Significant abdominal aortic atherosclerotic change. Electronically Signed   By: PIvar DrapeM.D.   On: 02/20/2016 17:09    Procedures Procedures (including critical care time)  CRITICAL CARE Performed by: ZFredia SorrowTotal critical care time: 45 minutes Critical care time was exclusive of separately billable procedures and treating other patients. Critical care was necessary to treat or prevent imminent or life-threatening deterioration. Critical care was time spent personally by me on the following activities: development of treatment plan with patient and/or surrogate as well as nursing, discussions with consultants, evaluation of patient's response to treatment, examination of patient, obtaining history from patient or surrogate, ordering and performing treatments and interventions, ordering and review of laboratory studies, ordering and review of radiographic studies, pulse oximetry and re-evaluation of patient's  condition.   Medications Ordered in ED Medications  0.9 %  sodium chloride infusion (not administered)  sodium chloride 0.9 % bolus 250 mL (250 mLs Intravenous Not Given 02/20/16 1714)  vancomycin (VANCOCIN) IVPB 1000 mg/200 mL premix (not administered)  piperacillin-tazobactam (ZOSYN) IVPB 3.375 g (not administered)  ondansetron (ZOFRAN) injection 4 mg (4 mg Intravenous Given 02/20/16 1704)  iopamidol (ISOVUE-300) 61 % injection (100 mLs  Contrast Given 02/20/16 1647)  sodium chloride 0.9 % bolus 250 mL (0 mLs Intravenous Stopped 02/20/16 1844)  sodium chloride 0.9 % bolus 1,000 mL (1,000 mLs Intravenous New Bag/Given 02/20/16 2220)    And  sodium chloride 0.9 % bolus 1,000 mL (0 mLs Intravenous Stopped 02/20/16 2258)    And  sodium chloride 0.9 % bolus 1,000 mL (0 mLs Intravenous Stopped 02/20/16 2258)  piperacillin-tazobactam (ZOSYN) IVPB 3.375 g (0 g Intravenous Stopped 02/20/16 1945)  vancomycin (VANCOCIN) 2,000 mg in sodium chloride 0.9 % 500 mL IVPB (0 mg Intravenous Stopped 02/20/16 2134)     Initial Impression / Assessment and Plan / ED Course  I have reviewed the triage vital signs and the nursing notes.  Pertinent labs & imaging results that were available during my care of the patient were reviewed by me and considered in my medical decision making (see chart for details).     Patient sent in from dialysis. Brought in by EMS. Reporting hypotension and right lower quadrant abdominal pain. Upon arrival here blood pressure was 96 systolic. Patient relays information that for the past 2 months his blood pressure drops during dialysis. Often times they sent him home. It usually recovers over the next day. Patient denies any chest pain shortness of breath nausea vomiting or diarrhea as has a feeling of some chills but no fever. Had a little bit slight cough and congestion.  Patient initially was given 250 mL bolus of fluid had lab workup and evaluation. Chest x-ray raises some question  for pneumonia but nothing definite. Patient also had CT of the abdomen without any acute findings. Following CT of abdomen patient bottom his blood pressures after he went down to the 80s and eventually down to the 00T systolic. Patient then was started on sepsis protocol.  Patient's total amount of fluid was to be 3 L at a liter and a half I contacted critical care pressures were still quite low but it wasn't clear whether this truly was a septic picture or not. Have ordered CT chest but patient's blood pressure was too low to get that done. They recommend go ahead and given him the rest of the total amount of fluids which is 3 L and then the recontact them.  Patients remained clinically very stable heart rate despite the low blood pressures. Slowly, some highest we've ever gotten is 87 systolic. Patient is now working on his final liter of fluid.  CT scan of the chest showed no evidence of pneumonia. Patient is a next initial lactic acid was elevated but this is not unusual at dialysis patient but repeat was higher at 3.  Following the third liter of fluid patient will be reassessed will recontact critical care if he still hypotensive. If pressures are up probably does require internal medicine admission. Primary care doctors Dr. Posey Pronto. Followed by [facility].  Despite all the fluids patient's oxygen status is done fine. Oxygen levels have been in the low 90%. Has been on 2 L of oxygen recently with oxygen levels in the upper 90s just made him feel more comfortable never was truly short of breath.  Agent will be turned over to the overnight ED physician once the third liters and then he can be reassessed. Patient currently nontoxic no acute distress other than the fact that the blood pressures are still below 90 systolic.  Final Clinical Impressions(s) / ED Diagnoses   Final diagnoses:  Hypotension, unspecified hypotension type  Sepsis, due to unspecified organism ([place])  Upper respiratory  tract infection, unspecified type    New Prescriptions New Prescriptions   No medications on file     Fredia Sorrow, MD 02/21/16 [phone]

## 2016-02-22 DIAGNOSIS — N186 End stage renal disease: Secondary | ICD-10-CM

## 2016-02-22 DIAGNOSIS — I480 Paroxysmal atrial fibrillation: Secondary | ICD-10-CM

## 2016-02-22 DIAGNOSIS — Z992 Dependence on renal dialysis: Secondary | ICD-10-CM

## 2016-02-22 LAB — RENAL FUNCTION PANEL
Albumin: 2.2 g/dL — ABNORMAL LOW (ref 3.5–5.0)
Anion gap: 13 (ref 5–15)
BUN: 28 mg/dL — ABNORMAL HIGH (ref 6–20)
CHLORIDE: 99 mmol/L — AB (ref 101–111)
CO2: 19 mmol/L — ABNORMAL LOW (ref 22–32)
Calcium: 8.9 mg/dL (ref 8.9–10.3)
Creatinine, Ser: 6.65 mg/dL — ABNORMAL HIGH (ref 0.61–1.24)
GFR, EST AFRICAN AMERICAN: 9 mL/min — AB (ref >60)
GFR, EST NON AFRICAN AMERICAN: 8 mL/min — AB (ref >60)
Glucose, Bld: 141 mg/dL — ABNORMAL HIGH (ref 65–99)
POTASSIUM: 4.7 mmol/L (ref 3.5–5.1)
Phosphorus: 4.9 mg/dL — ABNORMAL HIGH (ref 2.5–4.6)
Sodium: 131 mmol/L — ABNORMAL LOW (ref 135–145)

## 2016-02-22 LAB — CBC
HEMATOCRIT: 39.9 % (ref 39.0–52.0)
HEMOGLOBIN: 12.9 g/dL — AB (ref 13.0–17.0)
MCH: 31.4 pg (ref 26.0–34.0)
MCHC: 32.3 g/dL (ref 30.0–36.0)
MCV: 97.1 fL (ref 78.0–100.0)
Platelets: 132 10*3/uL — ABNORMAL LOW (ref 150–400)
RBC: 4.11 MIL/uL — AB (ref 4.22–5.81)
RDW: 17.7 % — ABNORMAL HIGH (ref 11.5–15.5)
WBC: 11.9 10*3/uL — AB (ref 4.0–10.5)

## 2016-02-22 MED ORDER — MUPIROCIN 2 % EX OINT
1.0000 "application " | TOPICAL_OINTMENT | Freq: Two times a day (BID) | CUTANEOUS | Status: DC
  Administered 2016-02-22 – 2016-02-25 (×7): 1 via NASAL
  Filled 2016-02-22: qty 22

## 2016-02-22 MED ORDER — HYDROCORTISONE NA SUCCINATE PF 100 MG IJ SOLR
100.0000 mg | Freq: Four times a day (QID) | INTRAMUSCULAR | Status: DC
  Administered 2016-02-22 – 2016-02-23 (×3): 100 mg via INTRAVENOUS
  Filled 2016-02-22 (×3): qty 2

## 2016-02-22 MED ORDER — DOXERCALCIFEROL 4 MCG/2ML IV SOLN
INTRAVENOUS | Status: AC
  Administered 2016-02-22: 4 ug via INTRAVENOUS
  Filled 2016-02-22: qty 2

## 2016-02-22 MED ORDER — CHLORHEXIDINE GLUCONATE CLOTH 2 % EX PADS
6.0000 | MEDICATED_PAD | Freq: Every day | CUTANEOUS | Status: DC
  Administered 2016-02-22 – 2016-02-25 (×4): 6 via TOPICAL

## 2016-02-22 MED ORDER — MIDODRINE HCL 5 MG PO TABS
ORAL_TABLET | ORAL | Status: AC
  Filled 2016-02-22: qty 2

## 2016-02-22 MED ORDER — MIDODRINE HCL 5 MG PO TABS
10.0000 mg | ORAL_TABLET | Freq: Three times a day (TID) | ORAL | Status: DC
  Administered 2016-02-22 – 2016-02-25 (×11): 10 mg via ORAL
  Filled 2016-02-22 (×10): qty 2

## 2016-02-22 NOTE — Progress Notes (Signed)
  Arrival Method: Bed Mental Orientation: alert and orientated x4 Telemetry: box 7 Assessment: Completed Skin: See flowsheet Iv: Right FA Pain:denies  Tubes:none Safety Measures: Safety Fall Prevention Plan has bee, discussed  Admission: Completed [facility] Orientation: Patient has been orientated to the room, unit and staff.  Family: none   Orders have been reviewed and implemented. Will continue to monitor the patient. Call light has been placed within reach and bed alarm has been activated.   Nelma Rothman, RN [facility]  Phone number: [phone]

## 2016-02-22 NOTE — Evaluation (Signed)
Occupational Therapy Evaluation Patient Details Name: Chad [name] MRN: [id number] DOB: 04/12/1955 Today's Date: 02/22/2016    History of Present Illness pt presents with hypotension.  pt with hx of ESRD, Kidney Transplant, DM, Hep C, CKD, Gout, Shingles, HTN, and multiple L Femur surgeries s/p fx.     Clinical Impression   Pt admitted with above. He demonstrates the below listed deficits and will benefit from continued OT to maximize safety and independence with BADLs.  Pt presents to OT with generalized weakness, and impaired standing balance.  He requires min guard assist with ADLs.  Plan is for pt to return home with son assisting as needed.       Follow Up Recommendations  No OT follow up;Supervision - Intermittent    Equipment Recommendations  None recommended by OT    Recommendations for Other Services       Precautions / Restrictions Precautions Precautions: Fall      Mobility Bed Mobility Overal bed mobility: Needs Assistance Bed Mobility: Supine to Sit;Sit to Supine     Supine to sit: Supervision Sit to supine: Supervision   General bed mobility comments: increased time to lift LEs onto bed   Transfers Overall transfer level: Needs assistance Equipment used: None Transfers: Sit to/from Stand Sit to Stand: Min guard         General transfer comment: supported self on back of chair     Balance Overall balance assessment: Needs assistance Sitting-balance support: No upper extremity supported;Feet supported Sitting balance-Leahy Scale: Good     Standing balance support: Bilateral upper extremity supported;During functional activity Standing balance-Leahy Scale: Poor Standing balance comment: Pt requires bil. UE support and min guard assist.  Stood x 5 mins                             ADL Overall ADL's : Needs assistance/impaired Eating/Feeding: Independent   Grooming: Wash/dry hands;Wash/dry face;Oral care;Brushing hair;Set  up;Sitting   Upper Body Bathing: Set up;Sitting   Lower Body Bathing: Min guard;Sit to/from stand   Upper Body Dressing : Set up;Sitting   Lower Body Dressing: Min guard;Sit to/from stand;Sitting/lateral leans   Toilet Transfer: Min guard;Stand-pivot;BSC   Toileting- Architect and Hygiene: Min guard;Sit to/from stand;Sitting/lateral lean       Functional mobility during ADLs: Min guard       Vision     Perception     Praxis      Pertinent Vitals/Pain Pain Assessment: No/denies pain     Hand Dominance Right   Extremity/Trunk Assessment Upper Extremity Assessment Upper Extremity Assessment: Overall WFL for tasks assessed   Lower Extremity Assessment Lower Extremity Assessment: Defer to PT evaluation   Cervical / Trunk Assessment Cervical / Trunk Assessment: Kyphotic   Communication Communication Communication: No difficulties   Cognition Arousal/Alertness: Awake/alert Behavior During Therapy: WFL for tasks assessed/performed Overall Cognitive Status: Within Functional Limits for tasks assessed                     General Comments       Exercises       Shoulder Instructions      Home Living Family/patient expects to be discharged to:: Private residence Living Arrangements: Children;Other relatives Available Help at Discharge: Family;Available PRN/intermittently Type of Home: House Home Access: Ramped entrance     Home Layout: One level         Bathroom Toilet: Handicapped height Bathroom Accessibility: No  Home Equipment: Bedside commode;Wheelchair - manual (Drop arm 3-in-1)   Additional Comments: Pt sponge bathes and uses BSC due to bathroom inaccessibillity       Prior Functioning/Environment Level of Independence: Independent with assistive device(s)        Comments: pt Independent at a W/C level, but has HHPT working on ambulation.  pt performs most homemaking tasks, but indicates sometimes needs help with  laundry.          OT Problem List: Decreased strength;Decreased activity tolerance;Impaired balance (sitting and/or standing);Decreased knowledge of use of DME or AE   OT Treatment/Interventions: Self-care/ADL training;Therapeutic exercise;DME and/or AE instruction;Therapeutic activities;Patient/family education;Balance training    OT Goals(Current goals can be found in the care plan section) Acute Rehab OT Goals Patient Stated Goal: Home OT Goal Formulation: With patient Time For Goal Achievement: 03/14/16 Potential to Achieve Goals: Good ADL Goals Pt Will Perform Grooming: with modified independence;standing Pt Will Perform Lower Body Bathing: sit to/from stand;with set-up Pt Will Perform Lower Body Dressing: with set-up;sit to/from stand Pt Will Transfer to Toilet: with min assist;ambulating;regular height toilet;bedside commode;grab bars Pt Will Perform Toileting - Clothing Manipulation and hygiene: with supervision;sit to/from stand  OT Frequency: Min 2X/week   Barriers to D/C:            Co-evaluation              End of Session Nurse Communication: Mobility status  Activity Tolerance: Patient tolerated treatment well Patient left: in bed;with call bell/phone within reach;with family/visitor present   Time: 1610-9604 OT Time Calculation (min): 17 min Charges:  OT General Charges $OT Visit: 1 Procedure OT Evaluation $OT Eval Moderate Complexity: 1 Procedure G-Codes:    Mycala Warshawsky M 03-03-2016, 2:53 PM

## 2016-02-22 NOTE — Progress Notes (Signed)
[facility] Progress Note   Subjective: no c/o', no SOB, able to get up on scale w 2-person, weighed in at 106 kg  Vitals:   02/22/16 1515 02/22/16 1530 02/22/16 1545 02/22/16 1600  BP: (!) 83/58 (!) 84/55 (!) 81/52 (!) 78/51  Pulse: 78 78 78 77  Resp:      Temp:      TempSrc:      SpO2:      Weight:      Height:        Inpatient medications: . allopurinol  100 mg Oral BID  . amiodarone  200 mg Oral Daily  . aspirin EC  81 mg Oral Daily  . atorvastatin  40 mg Oral QHS  . Chlorhexidine Gluconate Cloth  6 each Topical Q0600  . doxercalciferol  4 mcg Intravenous Q M,W,F-HD  . heparin  5,000 Units Subcutaneous Q8H  . hydrocortisone sod succinate (SOLU-CORTEF) inj  100 mg Intravenous Q6H  . midodrine  10 mg Oral TID WC  . mupirocin ointment  1 application Nasal BID  . saccharomyces boulardii  250 mg Oral BID  . sevelamer carbonate  800 mg Oral TID WC    acetaminophen **OR** acetaminophen, ondansetron **OR** ondansetron (ZOFRAN) IV  Exam: Alert, no distress, chron ill No jvd Chest clear bilat to bases RRR Abd obese soft ntnd Ext trace - 1+ edema below the knees, chron bilat venous stasis skin changes LE's Neuro alert and ox 3, gen'd weakness  Dialysis: [facility] MWF 4h   2/2 bath  101kg   Hep 8000 then 2000prn  LUA AVF - Hect 4 ug  - labs: pth 593, P 3.7  Ca 10.3  Hb 13      Assessment: 1. Hypotension - possible AI which should be ruled out.  Is 5-6 kg above dry wt with clear lungs/ and CXR.  All old CXR's reviewed going back 3-4 yrs, no CHF.  Will try raising dry wt to 105kg (from 101kg) and see how her tolerates.  Likely has R sided edema w chronic undialyzable leg edema which we will have to ignore to some extent.  2. ESRD - HD MWF 3. Anemia no ESA 4. PAF - in nsr, on amio, no anticoag due to GIB 5. Vol - as above  Plan - as above   Vinson Moselle MD [facility] pager [phone]   02/22/2016, 4:34 PM    Recent Labs Lab  02/20/16 1556 02/21/16 0502 02/22/16 1427  NA 132* 135 131*  K 4.1 4.4 4.7  CL 93* 100* 99*  CO2 26 24 19*  GLUCOSE 73 86 141*  BUN 11 12 28*  CREATININE 3.91* 4.57* 6.65*  CALCIUM 8.5* 8.4* 8.9  PHOS  --   --  4.9*    Recent Labs Lab 02/20/16 1556 02/21/16 0502 02/22/16 1427  AST 18 15  --   ALT 8* 5*  --   ALKPHOS 85 66  --   BILITOT 1.4* 0.9  --   PROT 7.9 6.7  --   ALBUMIN 2.9* 2.2* 2.2*    Recent Labs Lab 02/20/16 1556 02/21/16 0156 02/21/16 0502 02/22/16 1427  WBC 7.0 6.3 7.2 11.9*  NEUTROABS 4.2 3.8 5.0  --   HGB 14.6 12.6* 13.3 12.9*  HCT 44.6 40.3 42.2 39.9  MCV 98.0 99.5 99.3 97.1  PLT 128* 112* 107* 132*   Iron/TIBC/Ferritin/ %Sat    Component Value Date/Time   IRON 45 06/16/2014 0445   TIBC 172 (L)  06/16/2014 0445   FERRITIN 240 03/16/2014 0723   IRONPCTSAT 26 06/16/2014 0445

## 2016-02-22 NOTE — Progress Notes (Deleted)
  [facility] Progress Note   Subjective: no c/o', no SOB, able to get up on scale w 2-person, weighed in at 106 kg  Vitals:   02/22/16 1030 02/22/16 1100 02/22/16 1130 02/22/16 1200  BP: (!) 87/62 (!) 89/62 90/67   Pulse: 86 83 82   Resp:      Temp:    98.2 F (36.8 C)  TempSrc:    Oral  SpO2: 92% 96% 92%   Weight:      Height:        Inpatient medications: . allopurinol  100 mg Oral BID  . amiodarone  200 mg Oral Daily  . aspirin EC  81 mg Oral Daily  . atorvastatin  40 mg Oral QHS  . Chlorhexidine Gluconate Cloth  6 each Topical Q0600  . doxercalciferol  4 mcg Intravenous Q M,W,F-HD  . heparin  5,000 Units Subcutaneous Q8H  . hydrocortisone sod succinate (SOLU-CORTEF) inj  100 mg Intravenous Q6H  . midodrine  10 mg Oral TID WC  . mupirocin ointment  1 application Nasal BID  . piperacillin-tazobactam (ZOSYN)  IV  3.375 g Intravenous Q12H  . saccharomyces boulardii  250 mg Oral BID  . sevelamer carbonate  800 mg Oral TID WC  . vancomycin  1,000 mg Intravenous Q M,W,F-HD    acetaminophen **OR** acetaminophen, ondansetron **OR** ondansetron (ZOFRAN) IV  Exam: Alert, no distress, chron ill No jvd Chest clear bilat to bases RRR Abd obese soft ntnd Ext trace - 1+ edema below the knees, chron bilat venous stasis skin changes LE's Neuro alert and ox 3, gen'd weakness  Dialysis: [facility] MWF 4h   2/2 bath  101kg   Hep 8000 then 2000prn  LUA AVF - Hect 4 ug  - labs: pth 593, P 3.7  Ca 10.3  Hb 13      Assessment: 1. Hypotension - possible AI which should be ruled out.  Is 5-6 kg above dry wt with clear lungs/ and CXR.  All old CXR's reviewed going back 3-4 yrs, no CHF.  Will try raising dry wt to 105kg (from 101kg) and see how her tolerates.  Likely has R sided edema w chronic undialyzable leg edema which we will have to ignore to some extent.  2. ESRD - HD MWF 3. Anemia no ESA 4. PAF - in nsr, on amio, no anticoag due to GIB 5. Vol - as  above  Plan - as above   Vinson Moselle MD [facility] pager [phone]   02/22/2016, 12:57 PM    Recent Labs Lab 02/20/16 1556 02/21/16 0502  NA 132* 135  K 4.1 4.4  CL 93* 100*  CO2 26 24  GLUCOSE 73 86  BUN 11 12  CREATININE 3.91* 4.57*  CALCIUM 8.5* 8.4*    Recent Labs Lab 02/20/16 1556 02/21/16 0502  AST 18 15  ALT 8* 5*  ALKPHOS 85 66  BILITOT 1.4* 0.9  PROT 7.9 6.7  ALBUMIN 2.9* 2.2*    Recent Labs Lab 02/20/16 1556 02/21/16 0156 02/21/16 0502  WBC 7.0 6.3 7.2  NEUTROABS 4.2 3.8 5.0  HGB 14.6 12.6* 13.3  HCT 44.6 40.3 42.2  MCV 98.0 99.5 99.3  PLT 128* 112* 107*   Iron/TIBC/Ferritin/ %Sat    Component Value Date/Time   IRON 45 06/16/2014 0445   TIBC 172 (L) 06/16/2014 0445   FERRITIN 240 03/16/2014 0723   IRONPCTSAT 26 06/16/2014 0445

## 2016-02-22 NOTE — Progress Notes (Addendum)
PROGRESS NOTE    Chad [name]  ZOX:[id number] DOB: Jan 30, 1955 DOA: 02/20/2016 PCP: [name], MD  Brief Narrative: 61/M with with a history of nonischemic cardiomyopathy Ef 15-20%, paroxysmal atrial flutter not on anticoagulation secondary to GI bleed, ESRD (MWF) with failed renal transplant (1996), hepatitis C, and H/o Femur fracture, s/o multiple surgeries then had osteomyelitis, s/p Abx therapy last year, now wheel chair bound presented to the ED with Hypotension and abd pain after HD. Pt reports that prednisone weaned down to 5mg  and not refilled recently but had a few left which he was taking . S/p close to 4L in ER, mentation at baseline. Ct Chest abd/pelvis unremarkable  Assessment & Plan:    Hypotension -suspect multifactorial, ESRD with fluid removal, chronically on midodrine and AI -suspect component of AI, given chronic prednisone use with recently discontinuation. -Random cortisol 5.9, increase stress dose steroids, resume midodrine -I cannot do a Stim test since he was started on stress dose steroids on admission -Coreg on hold -Blood Cx negative, afebrile, stop Vanc/Zosyn -CT chest/abd pelvis negative for source of infection, FLu negative, no fevers -EF improved to 55% now  NICM/ Dilated cardiomyopathy EF of 15-20% -mildly elevated troponin  In setting of hypotension, clinically no evidence of ACS -ECHO with EF improved to 55% now -holding coreg, volume status managed with HD   OSA (obstructive sleep apnea)  -Paroxysmal Atrial flutter  -NSR now, contine amiodarone.  -not on anticoagulation due to history of GI bleed.    ESRD on dialysis (HCC) -on HD MWF, last HD yesterday, now s/p 4L IVF  Anterior mediastinal density - further work up as outpatient.  Thrombocytopenia - Chronic. Follow CBC.  DVT prophylaxis: Heparin Code Status: Fullcode.  Family Communication: None at bedside Disposition Plan: Tx to Tele when BP stable and improved  Consultants:    Renal  Antimicrobials: Vanc/Zosyn 1/25  Subjective: Feels well, no complaints  Objective: Vitals:   02/22/16 1030 02/22/16 1100 02/22/16 1130 02/22/16 1200  BP: (!) 87/62 (!) 89/62 90/67   Pulse: 86 83 82   Resp:      Temp:    98.2 F (36.8 C)  TempSrc:    Oral  SpO2: 92% 96% 92%   Weight:      Height:        Intake/Output Summary (Last 24 hours) at 02/22/16 1306 Last data filed at 02/22/16 1130  Gross per 24 hour  Intake              390 ml  Output               10 ml  Net              380 ml   Filed Weights   02/20/16 1536 02/21/16 0500 02/22/16 0500  Weight: 97.1 kg (214 lb) 104.2 kg (229 lb 11.5 oz) 107.5 kg (236 lb 15.9 oz)    Examination:  General exam: Appears calm and comfortable, AAOX3, No distress Respiratory system: Clear to auscultation. Respiratory effort normal. Cardiovascular system: S1 & S2 heard, RRR. No JVD, murmurs Gastrointestinal system: Abdomen is nondistended, soft and nontender. Normal bowel sounds heard. Central nervous system: Alert and oriented. No focal neurological deficits. Extremities: L femur with deformity and LLE, L arm AVF Skin: No rashes, lesions or ulcers Psychiatry: Judgement and insight appear normal. Mood & affect appropriate.     Data Reviewed: I have personally reviewed following labs and imaging studies  CBC:  Recent Labs Lab 02/20/16 1556  02/21/16 0156 02/21/16 0502  WBC 7.0 6.3 7.2  NEUTROABS 4.2 3.8 5.0  HGB 14.6 12.6* 13.3  HCT 44.6 40.3 42.2  MCV 98.0 99.5 99.3  PLT 128* 112* 107*   Basic Metabolic Panel:  Recent Labs Lab 02/20/16 1556 02/21/16 0502  NA 132* 135  K 4.1 4.4  CL 93* 100*  CO2 26 24  GLUCOSE 73 86  BUN 11 12  CREATININE 3.91* 4.57*  CALCIUM 8.5* 8.4*   GFR: Estimated Creatinine Clearance: 22.4 mL/min (by C-G formula based on SCr of 4.57 mg/dL (H)). Liver Function Tests:  Recent Labs Lab 02/20/16 1556 02/21/16 0502  AST 18 15  ALT 8* 5*  ALKPHOS 85 66  BILITOT 1.4*  0.9  PROT 7.9 6.7  ALBUMIN 2.9* 2.2*   No results for input(s): LIPASE, AMYLASE in the last 168 hours. No results for input(s): AMMONIA in the last 168 hours. Coagulation Profile: No results for input(s): INR, PROTIME in the last 168 hours. Cardiac Enzymes:  Recent Labs Lab 02/21/16 0156 02/21/16 0746 02/21/16 1354  TROPONINI 0.13* 0.18* 0.15*   BNP (last 3 results) No results for input(s): PROBNP in the last 8760 hours. HbA1C: No results for input(s): HGBA1C in the last 72 hours. CBG: No results for input(s): GLUCAP in the last 168 hours. Lipid Profile: No results for input(s): CHOL, HDL, LDLCALC, TRIG, CHOLHDL, LDLDIRECT in the last 72 hours. Thyroid Function Tests: No results for input(s): TSH, T4TOTAL, FREET4, T3FREE, THYROIDAB in the last 72 hours. Anemia Panel: No results for input(s): VITAMINB12, FOLATE, FERRITIN, TIBC, IRON, RETICCTPCT in the last 72 hours. Urine analysis:    Component Value Date/Time   COLORURINE YELLOW 03/18/2014 1730   APPEARANCEUR CLEAR 03/18/2014 1730   LABSPEC 1.013 03/18/2014 1730   PHURINE 5.0 03/18/2014 1730   GLUCOSEU NEGATIVE 03/18/2014 1730   HGBUR NEGATIVE 03/18/2014 1730   BILIRUBINUR NEGATIVE 03/18/2014 1730   KETONESUR NEGATIVE 03/18/2014 1730   PROTEINUR 30 (A) 03/18/2014 1730   UROBILINOGEN 0.2 03/18/2014 1730   NITRITE NEGATIVE 03/18/2014 1730   LEUKOCYTESUR NEGATIVE 03/18/2014 1730   Sepsis Labs: @LABRCNTIP (procalcitonin:4,lacticidven:4)  ) Recent Results (from the past 240 hour(s))  Culture, blood (Routine X 2) w Reflex to ID Panel     Status: None (Preliminary result)   Collection Time: 02/20/16  6:10 PM  Result Value Ref Range Status   Specimen Description BLOOD RIGHT ANTECUBITAL  Final   Special Requests BOTTLES DRAWN AEROBIC AND ANAEROBIC 5CC  Final   Culture NO GROWTH 2 DAYS  Final   Report Status PENDING  Incomplete  Culture, blood (Routine X 2) w Reflex to ID Panel     Status: None (Preliminary result)    Collection Time: 02/20/16  6:54 PM  Result Value Ref Range Status   Specimen Description BLOOD RIGHT HAND  Final   Special Requests BOTTLES DRAWN AEROBIC AND ANAEROBIC 5CC  Final   Culture NO GROWTH 2 DAYS  Final   Report Status PENDING  Incomplete  MRSA PCR Screening     Status: Abnormal   Collection Time: 02/21/16  6:20 AM  Result Value Ref Range Status   MRSA by PCR POSITIVE (A) NEGATIVE Final    Comment:        The GeneXpert MRSA Assay (FDA approved for NASAL specimens only), is one component of a comprehensive MRSA colonization surveillance program. It is not intended to diagnose MRSA infection nor to guide or monitor treatment for MRSA infections. RESULT CALLED TO, READ BACK BY AND VERIFIED  WITH: Y. PINEDA 0850 01.25.18 N. MORRIS          Radiology Studies: Dg Chest 2 View  Result Date: 02/20/2016 CLINICAL DATA:  Non productive cough. Hypotension and right lower quadrant abdominal discomfort following dialysis. No history of chills or nausea vomiting or diarrhea. EXAM: CHEST  2 VIEW COMPARISON:  Portable chest x-ray of Jun 23, 2014 and PA and lateral chest x-ray of March 06, 2014. FINDINGS: The lungs are hypoinflated. There are coarse lung markings at the left lung base which are more conspicuous than on the most recent lateral radiograph of March 06, 2014. The cardiac silhouette is enlarged. The central pulmonary vascularity is mildly engorged. There is calcification in the wall of the aortic arch. There is no definite pleural effusion. There is prominent thoracic kyphosis with loss of height of multiple lower thoracic vertebral bodies. IMPRESSION: Left basilar atelectasis or pneumonia possibly superimposed upon pre-existing scarring. Cardiomegaly with mild central pulmonary vascular prominence consistent with low-grade CHF. These findings are accentuated by hypoinflation. Thoracic aortic atherosclerosis. Electronically Signed   By: David  [place] M.D.   On: 02/20/2016 16:41    Ct Chest Wo Contrast  Result Date: 02/20/2016 CLINICAL DATA:  61 year old male with possible pneumonia. EXAM: CT CHEST WITHOUT CONTRAST TECHNIQUE: Multidetector CT imaging of the chest was performed following the standard protocol without IV contrast. COMPARISON:  Chest radiograph dated 02/20/2016 FINDINGS: Evaluation of this exam is limited in the absence of intravenous contrast. Cardiovascular: There is mild cardiomegaly. No pericardial effusion. There is advanced multi vessel coronary vascular calcification involving the LAD, RCA, and left circumflex artery. There is mild atherosclerotic calcification of the thoracic aorta. No aneurysmal dilatation. There is mild prominence of the main pulmonary trunk suggestive of a degree of pulmonary hypertension. Mediastinum/Nodes: There is no hilar adenopathy. Two well-circumscribed adjacent ovoid soft tissue densities in the anterior mediastinum measuring up to 2.3 cm individually and a combined diameter of approximately 2.7 x 3.0 cm noted. These are indeterminate but may represent mildly enlarged lymph nodes, ectopic thyroid tissue, or other anterior mediastinal mass. These lesions were seen on the CT of 03/30/2009 and demonstrate minimal interval increase in size suggestive of a benign etiology. Clinical correlation is recommended. Lungs/Pleura: There are bibasilar predominantly linear and streaky densities, likely combination of atelectasis and scarring. Infiltrate is less likely but not excluded. Clinical correlation is recommended. There is no pleural effusion or pneumothorax. The central airways are patent. Upper Abdomen: There is stone within the neck of the gallbladder. No pericholecystic fluid. Right renal atrophy. The upper abdomen is otherwise unremarkable. Musculoskeletal: Osteopenia with degenerative changes of the spine. No acute fracture. IMPRESSION: Bibasilar linear and streaky densities most likely atelectasis/scarring. Infiltrate is less likely but  not excluded. Clinical correlation is recommended. Well-circumscribed bilobed soft tissue density in the anterior mediastinum with minimal interval change in size compared to the CT of 2011 likely of benign etiology. Mild cardiomegaly.  Multivessel coronary vascular disease. Electronically Signed   By: Elgie Collard M.D.   On: 02/20/2016 21:42   Ct Abdomen Pelvis W Contrast  Result Date: 02/20/2016 CLINICAL DATA:  Mid abdomen pain after dialysis EXAM: CT ABDOMEN AND PELVIS WITH CONTRAST TECHNIQUE: Multidetector CT imaging of the abdomen and pelvis was performed using the standard protocol following bolus administration of intravenous contrast. CONTRAST:  ISOVUE-300 IOPAMIDOL (ISOVUE-300) INJECTION 61% COMPARISON:  CT abdomen and pelvis of 08/09/2011 FINDINGS: Lower chest: There is linear atelectasis at both lung bases right greater than left. No pleural effusion  is seen. There is cardiomegaly present with calcification noted of the mitral and aortic valves. No pericardial effusion is seen. Coronary artery calcifications are noted diffusely. Hepatobiliary: The liver is unremarkable although linear artifacts are created by the patient's arms overlying the patient's sides limiting detail. No calcified gallstones are seen. Pancreas: The pancreas is normal in size and the pancreatic duct is not dilated. Spleen: The spleen is unremarkable. Adrenals/Urinary Tract: The adrenal glands appear normal. The kidneys are very atrophic with no evidence of hydronephrosis. Considerable renal artery calcifications are noted bilaterally. The ureters are normal in caliber. Transplanted kidney in the right pelvis is unremarkable with no hydronephrosis noted. A low-attenuation structure medially and anteriorly within the transplanted kidney is unchanged compared to the prior CT. On delayed images no contrast is seen to be excreted by the transplanted right pelvic kidney. Urinary bladder is decompressed and cannot be well  evaluated. Stomach/Bowel: The stomach is not well distended but no abnormality is evident. No small bowel distention is seen. There are scattered rectosigmoid colon diverticula present. No diverticulitis is noted. The colon is largely decompressed. Vascular/Lymphatic: The abdominal aorta is normal in caliber with advanced abdominal aortic atherosclerotic change present. No adenopathy is seen. Reproductive: The prostate is normal in size. Other: A small umbilical hernia is present containing only fat. Musculoskeletal: The lumbar vertebrae are in normal alignment with degenerative change in the lower thoracic and upper lumbar spine. Left total hip replacement is present. IMPRESSION: 1. No explanation for the patient's mid abdominal pain is seen. 2. No contrast is seen to be excreted by the transplanted right pelvic kidney. The native kidneys appear atrophic. No hydronephrosis is seen. 3. Coronary artery calcifications. Significant abdominal aortic atherosclerotic change. Electronically Signed   By: Dwyane Dee M.D.   On: 02/20/2016 17:09        Scheduled Meds: . allopurinol  100 mg Oral BID  . amiodarone  200 mg Oral Daily  . aspirin EC  81 mg Oral Daily  . atorvastatin  40 mg Oral QHS  . Chlorhexidine Gluconate Cloth  6 each Topical Q0600  . doxercalciferol  4 mcg Intravenous Q M,W,F-HD  . heparin  5,000 Units Subcutaneous Q8H  . hydrocortisone sod succinate (SOLU-CORTEF) inj  100 mg Intravenous Q6H  . midodrine  10 mg Oral TID WC  . mupirocin ointment  1 application Nasal BID  . piperacillin-tazobactam (ZOSYN)  IV  3.375 g Intravenous Q12H  . saccharomyces boulardii  250 mg Oral BID  . sevelamer carbonate  800 mg Oral TID WC  . vancomycin  1,000 mg Intravenous Q M,W,F-HD   Continuous Infusions:   LOS: 1 day    Time spent:    Zannie Cove, MD [name] Pager [phone]  If 7PM-7AM, please contact night-coverage [email or web address] Password [email or web address] 02/22/2016, 1:06 PM

## 2016-02-22 NOTE — Evaluation (Signed)
Physical Therapy Evaluation Patient Details Name: GRACEN SOUTHWELL MRN: [id number] DOB: 1955-02-22 Today's Date: 02/22/2016   History of Present Illness  pt presents with hypotension.  pt with hx of ESRD, Kidney Transplant, DM, Hep C, CKD, Gout, Shingles, HTN, and multiple L Femur surgeries s/p fx.    Clinical Impression  Pt eager for mobility and indicates he has been working with PT at home progressing his standing tolerance and working on ambulation.  Pt able to tell PT how to set-up chair to simulate transfer to his W/C at home.  BPs monitored throughout session and found to be in 90s to 100s systolic.  Feel pt will be able to return to home with resuming his HHPT services.  Will continue to follow.      Follow Up Recommendations Home health PT;Supervision - Intermittent    Equipment Recommendations  None recommended by PT    Recommendations for Other Services       Precautions / Restrictions Precautions Precautions: Fall Restrictions Weight Bearing Restrictions: No      Mobility  Bed Mobility Overal bed mobility: Needs Assistance Bed Mobility: Supine to Sit;Sit to Supine     Supine to sit: Supervision Sit to supine: Min assist   General bed mobility comments: Only A with returning L LE to bed.  pt needs increased time to complete.    Transfers Overall transfer level: Needs assistance Equipment used: None Transfers: Sit to/from [employer] Sit to Stand: Min assist;+2 physical assistance Stand pivot transfers: Min guard;Min assist       General transfer comment: pt initially came to stand on scale to weigh pt and needed MinA x2 for coming to full standing on scale.  When pivoting bed to chair towards R side pt was MinG and leans on UEs for support.  MinA for return to bed due to low height of chair.    Ambulation/Gait                Stairs            Wheelchair Mobility    Modified Rankin (Stroke Patients Only)       Balance  Overall balance assessment: Needs assistance Sitting-balance support: No upper extremity supported;Feet supported Sitting balance-Leahy Scale: Good     Standing balance support: Bilateral upper extremity supported;During functional activity Standing balance-Leahy Scale: Poor                               Pertinent Vitals/Pain Pain Assessment: No/denies pain    Home Living Family/patient expects to be discharged to:: Private residence Living Arrangements: Children;Other relatives Available Help at Discharge: Family;Available PRN/intermittently Type of Home: House Home Access: Ramped entrance     Home Layout: One level Home Equipment: Bedside commode;Wheelchair - manual (Drop arm 3-in-1)      Prior Function Level of Independence: Independent with assistive device(s)         Comments: pt Independent at a W/C level, but has HHPT working on ambulation.  pt performs most homemaking tasks, but indicates sometimes needs help with laundry.       Hand Dominance        Extremity/Trunk Assessment   Upper Extremity Assessment Upper Extremity Assessment: Defer to OT evaluation    Lower Extremity Assessment Lower Extremity Assessment: Generalized weakness;LLE deficits/detail LLE Deficits / Details: pt with multiple surgeries to L LE due to femur fx and non unions, but pt is able to  functionally use L LE during mobility.  Strength grossly 3/5. LLE Coordination: decreased fine motor;decreased gross motor    Cervical / Trunk Assessment Cervical / Trunk Assessment: Kyphotic  Communication   Communication: No difficulties  Cognition Arousal/Alertness: Awake/alert Behavior During Therapy: WFL for tasks assessed/performed Overall Cognitive Status: Within Functional Limits for tasks assessed                      General Comments      Exercises     Assessment/Plan    PT Assessment Patient needs continued PT services  PT Problem List Decreased  strength;Decreased activity tolerance;Decreased balance;Decreased mobility;Decreased coordination;Decreased knowledge of use of DME;Cardiopulmonary status limiting activity          PT Treatment Interventions DME instruction;Gait training;Functional mobility training;Therapeutic activities;Therapeutic exercise;Balance training;Patient/family education    PT Goals (Current goals can be found in the Care Plan section)  Acute Rehab PT Goals Patient Stated Goal: Home PT Goal Formulation: With patient Time For Goal Achievement: 03/07/16 Potential to Achieve Goals: Good    Frequency Min 3X/week   Barriers to discharge        Co-evaluation               End of Session Equipment Utilized During Treatment: Gait belt Activity Tolerance: Patient tolerated treatment well Patient left: in bed;with call bell/phone within reach Nurse Communication: Mobility status         Time: 0922-1007 PT Time Calculation (min) (ACUTE ONLY): 45 min   Charges:   PT Evaluation $PT Eval Moderate Complexity: 1 Procedure PT Treatments $Therapeutic Activity: 8-22 mins   PT G CodesSunny Schlein, [place]  [phone] 02/22/2016, 10:58 AM

## 2016-02-23 ENCOUNTER — Inpatient Hospital Stay (HOSPITAL_COMMUNITY): Payer: Medicare HMO

## 2016-02-23 LAB — GLUCOSE, CAPILLARY: Glucose-Capillary: 124 mg/dL — ABNORMAL HIGH (ref 65–99)

## 2016-02-23 LAB — HEPATITIS B SURFACE ANTIGEN: HEP B S AG: NEGATIVE

## 2016-02-23 MED ORDER — HYDROCORTISONE NA SUCCINATE PF 100 MG IJ SOLR
50.0000 mg | Freq: Four times a day (QID) | INTRAMUSCULAR | Status: DC
  Administered 2016-02-23 – 2016-02-24 (×4): 50 mg via INTRAVENOUS
  Filled 2016-02-23 (×3): qty 2

## 2016-02-23 NOTE — Progress Notes (Signed)
[facility] Progress Note   Dialysis Orders: [facility] MWF 4h   2/2 bath  101kg   Hep 8000 then 2000prn  LUA AVF - Hect 4 ug  - labs: pth 593, P 3.7  Ca 10.3  Hb 13  Assessment/Plan: 1. Hypotension/volume - possible adrenal insufficiency - prior chronic prednisone use related to kidney transplant also severe DCM, no pericard effusion, some valvular heart disease EF 55%; EDW raise significantly - BP still low - he had been inconsistent with midodrine prior to admission because he didn't fee like it helped - on 10 tid now - feels much better even though BP are still low; net UF 300 on Friday - post weight 106.2 - on IV stress steroids. No evidence of infection. The main reason we are trying to pull fluid on this person is recurrent problems with "SOB" while on dialysis, these episodes happen when "I am lying down" and he has to sit up and catch his breath.  No vol excess here on exam or CXR on admission.  Now is several kg up and lungs remain clear, will repeat CXR to r/o fluid excess at 105kg.  It may be that there is another cause for his SOB episodes other than fluid overload.  2. ESRD -MWF - next HD Monday 3. Anemia - hgb 12.9 - not on ESA  4. Secondary hyperparathyroidism - continue current binders/VDRA 5. Nutrition - severe protein malnutrition alb 2.2 - 6. PAF - on amio/no anticoag due to hx GIB, off coreg  Sheffield Slider, PA-C  [facility] [phone] 02/23/2016,9:29 AM  LOS: 2 days   Pt seen, examined, agree w assess/plan as above with additions as indicated.  Vinson Moselle MD [facility] pager [phone]    cell [phone] 02/23/2016, 11:52 AM     Subjective:   Feels better - no abdominal pain during HD yesterday. Appetite better Objective Vitals:   02/22/16 1846 02/23/16 0519 02/23/16 0640 02/23/16 0855  BP: (!) 128/97 (!) 76/53 (!) 82/55 (!) 83/55  Pulse: 83 79 78 80  Resp: 19 20    Temp: 97.7 F (36.5 C) 98.8 F (37.1  C)  98.7 F (37.1 C)  TempSrc: Oral Oral  Oral  SpO2: 94% 93%  95%  Weight:      Height:       Physical Exam General: NAD breathing easily on room air Heart: RRR Lungs: no rales, dim bases Abdomen: obese soft - some bruising left side Extremities:++ LE edema - chronic Dialysis Access: left upper AVF   Additional Objective Labs: Basic Metabolic Panel:  Recent Labs Lab 02/20/16 1556 02/21/16 0502 02/22/16 1427  NA 132* 135 131*  K 4.1 4.4 4.7  CL 93* 100* 99*  CO2 26 24 19*  GLUCOSE 73 86 141*  BUN 11 12 28*  CREATININE 3.91* 4.57* 6.65*  CALCIUM 8.5* 8.4* 8.9  PHOS  --   --  4.9*   Liver Function Tests:  Recent Labs Lab 02/20/16 1556 02/21/16 0502 02/22/16 1427  AST 18 15  --   ALT 8* 5*  --   ALKPHOS 85 66  --   BILITOT 1.4* 0.9  --   PROT 7.9 6.7  --   ALBUMIN 2.9* 2.2* 2.2*   No results for input(s): LIPASE, AMYLASE in the last 168 hours. CBC:  Recent Labs Lab 02/20/16 1556 02/21/16 0156 02/21/16 0502 02/22/16 1427  WBC 7.0 6.3 7.2 11.9*  NEUTROABS 4.2 3.8 5.0  --  HGB 14.6 12.6* 13.3 12.9*  HCT 44.6 40.3 42.2 39.9  MCV 98.0 99.5 99.3 97.1  PLT 128* 112* 107* 132*   Blood Culture    Component Value Date/Time   SDES BLOOD RIGHT HAND 02/20/2016 1854   SPECREQUEST BOTTLES DRAWN AEROBIC AND ANAEROBIC 5CC 02/20/2016 1854   CULT NO GROWTH 2 DAYS 02/20/2016 1854   REPTSTATUS PENDING 02/20/2016 1854    Cardiac Enzymes:  Recent Labs Lab 02/21/16 0156 02/21/16 0746 02/21/16 1354  TROPONINI 0.13* 0.18* 0.15*   CBG:  Recent Labs Lab 02/23/16 0820  GLUCAP 124*   Iron Studies: No results for input(s): IRON, TIBC, TRANSFERRIN, FERRITIN in the last 72 hours. Lab Results  Component Value Date   INR 1.17 04/03/2014   INR 1.12 03/29/2014   INR 1.05 03/06/2014   Studies/Results: No results found. Medications:  . allopurinol  100 mg Oral BID  . amiodarone  200 mg Oral Daily  . aspirin EC  81 mg Oral Daily  . atorvastatin  40 mg  Oral QHS  . Chlorhexidine Gluconate Cloth  6 each Topical Q0600  . doxercalciferol  4 mcg Intravenous Q M,W,F-HD  . heparin  5,000 Units Subcutaneous Q8H  . hydrocortisone sod succinate (SOLU-CORTEF) inj  100 mg Intravenous Q6H  . midodrine  10 mg Oral TID WC  . mupirocin ointment  1 application Nasal BID  . saccharomyces boulardii  250 mg Oral BID  . sevelamer carbonate  800 mg Oral TID WC

## 2016-02-23 NOTE — Progress Notes (Signed)
PROGRESS NOTE    [facility]ad [name]  ZOX:[id number] DOB: September 29, 1955 DOA: 02/20/2016 PCP: [name], MD  Brief Narrative: 60/M with with a history of nonischemic cardiomyopathy Ef 15-20%, paroxysmal atrial flutter not on anticoagulation secondary to GI bleed, ESRD (MWF) with failed renal transplant (1996), hepatitis C, and H/o Femur fracture, s/o multiple surgeries then had osteomyelitis, s/p Abx therapy last year, now wheel chair bound presented to the ED with Hypotension and abd pain after HD. Pt reports that prednisone weaned down to 5mg  and not refilled recently but had a few left which he was taking . S/p close to 4L in ER, mentation at baseline. Ct Chest abd/pelvis unremarkable  Assessment & Plan:    Hypotension -suspect multifactorial, ESRD with fluid removal, chronically on midodrine and AI -suspect component of AI, given chronic prednisone use with recently discontinuation. -Random cortisol 5.9,  Remains on stress dose steroids without much response, will cut down dose -I cannot do a Stim test since he was started on stress dose steroids on admission, will need to be pursued once he's weaned off steroids down the road -Coreg on hold -Blood Cx negative, afebrile, stopped Vanc/Zosyn -CT chest/abd pelvis negative for source of infection, FLu negative, no fevers -EF improved to 55% now -clinically improved although BP still low, ? Accuracy of BP measurement, asked RN to check manual BP too  NICM/ H/o Dilated cardiomyopathy EF of 15-20% -mildly elevated troponin  In setting of hypotension, clinically no evidence of ACS -ECHO with EF improved to 55% now -holding coreg, volume status managed with HD   OSA (obstructive sleep apnea)  -Paroxysmal Atrial flutter  -NSR now, contine amiodarone.  -not on anticoagulation due to history of GI bleed.    ESRD on dialysis (HCC) -on HD MWF, last HD yesterday, now s/p 4L IVF  Anterior mediastinal density - further work up as  outpatient.  Thrombocytopenia - Chronic. Follow CBC.  DVT prophylaxis: Heparin Code Status: Fullcode.  Family Communication: None at bedside Disposition Plan: Home when BP more stable  Consultants:   Renal  Antimicrobials: Vanc/Zosyn 1/25  Subjective: Feels well, no complaints  Objective: Vitals:   02/23/16 0519 02/23/16 0640 02/23/16 0855 02/23/16 1020  BP: (!) 76/53 (!) 82/55 (!) 83/55 (!) 74/48  Pulse: 79 78 80   Resp: 20     Temp: 98.8 F (37.1 C)  98.7 F (37.1 C)   TempSrc: Oral  Oral   SpO2: 93%  95%   Weight:      Height:        Intake/Output Summary (Last 24 hours) at 02/23/16 1105 Last data filed at 02/23/16 0700  Gross per 24 hour  Intake              360 ml  Output              310 ml  Net               50 ml   Filed Weights   02/22/16 0500 02/22/16 1400 02/22/16 1809  Weight: 107.5 kg (236 lb 15.9 oz) 106.9 kg (235 lb 10.8 oz) 106.2 kg (234 lb 2.1 oz)    Examination:  General exam: Appears calm and comfortable, AAOX3, No distress Respiratory system: Clear to auscultation. Respiratory effort normal. Cardiovascular system: S1 & S2 heard, RRR. No JVD, murmurs Gastrointestinal system: Abdomen is nondistended, soft and nontender. Normal bowel sounds heard. Central nervous system: Alert and oriented. No focal neurological deficits. Extremities: L femur with deformity and  LLE, L arm AVF Skin: No rashes, lesions or ulcers Psychiatry: Judgement and insight appear normal. Mood & affect appropriate.     Data Reviewed: I have personally reviewed following labs and imaging studies  CBC:  Recent Labs Lab 02/20/16 1556 02/21/16 0156 02/21/16 0502 02/22/16 1427  WBC 7.0 6.3 7.2 11.9*  NEUTROABS 4.2 3.8 5.0  --   HGB 14.6 12.6* 13.3 12.9*  HCT 44.6 40.3 42.2 39.9  MCV 98.0 99.5 99.3 97.1  PLT 128* 112* 107* 132*   Basic Metabolic Panel:  Recent Labs Lab 02/20/16 1556 02/21/16 0502 02/22/16 1427  NA 132* 135 131*  K 4.1 4.4 4.7  CL 93*  100* 99*  CO2 26 24 19*  GLUCOSE 73 86 141*  BUN 11 12 28*  CREATININE 3.91* 4.57* 6.65*  CALCIUM 8.5* 8.4* 8.9  PHOS  --   --  4.9*   GFR: Estimated Creatinine Clearance: 15.3 mL/min (by C-G formula based on SCr of 6.65 mg/dL (H)). Liver Function Tests:  Recent Labs Lab 02/20/16 1556 02/21/16 0502 02/22/16 1427  AST 18 15  --   ALT 8* 5*  --   ALKPHOS 85 66  --   BILITOT 1.4* 0.9  --   PROT 7.9 6.7  --   ALBUMIN 2.9* 2.2* 2.2*   No results for input(s): LIPASE, AMYLASE in the last 168 hours. No results for input(s): AMMONIA in the last 168 hours. Coagulation Profile: No results for input(s): INR, PROTIME in the last 168 hours. Cardiac Enzymes:  Recent Labs Lab 02/21/16 0156 02/21/16 0746 02/21/16 1354  TROPONINI 0.13* 0.18* 0.15*   BNP (last 3 results) No results for input(s): PROBNP in the last 8760 hours. HbA1C: No results for input(s): HGBA1C in the last 72 hours. CBG:  Recent Labs Lab 02/23/16 0820  GLUCAP 124*   Lipid Profile: No results for input(s): CHOL, HDL, LDLCALC, TRIG, CHOLHDL, LDLDIRECT in the last 72 hours. Thyroid Function Tests: No results for input(s): TSH, T4TOTAL, FREET4, T3FREE, THYROIDAB in the last 72 hours. Anemia Panel: No results for input(s): VITAMINB12, FOLATE, FERRITIN, TIBC, IRON, RETICCTPCT in the last 72 hours. Urine analysis:    Component Value Date/Time   COLORURINE YELLOW 03/18/2014 1730   APPEARANCEUR CLEAR 03/18/2014 1730   LABSPEC 1.013 03/18/2014 1730   PHURINE 5.0 03/18/2014 1730   GLUCOSEU NEGATIVE 03/18/2014 1730   HGBUR NEGATIVE 03/18/2014 1730   BILIRUBINUR NEGATIVE 03/18/2014 1730   KETONESUR NEGATIVE 03/18/2014 1730   PROTEINUR 30 (A) 03/18/2014 1730   UROBILINOGEN 0.2 03/18/2014 1730   NITRITE NEGATIVE 03/18/2014 1730   LEUKOCYTESUR NEGATIVE 03/18/2014 1730   Sepsis Labs: @LABRCNTIP (procalcitonin:4,lacticidven:4)  ) Recent Results (from the past 240 hour(s))  Culture, blood (Routine X 2) w  Reflex to ID Panel     Status: None (Preliminary result)   Collection Time: 02/20/16  6:10 PM  Result Value Ref Range Status   Specimen Description BLOOD RIGHT ANTECUBITAL  Final   Special Requests BOTTLES DRAWN AEROBIC AND ANAEROBIC 5CC  Final   Culture NO GROWTH 3 DAYS  Final   Report Status PENDING  Incomplete  Culture, blood (Routine X 2) w Reflex to ID Panel     Status: None (Preliminary result)   Collection Time: 02/20/16  6:54 PM  Result Value Ref Range Status   Specimen Description BLOOD RIGHT HAND  Final   Special Requests BOTTLES DRAWN AEROBIC AND ANAEROBIC 5CC  Final   Culture NO GROWTH 3 DAYS  Final   Report Status PENDING  Incomplete  MRSA PCR Screening     Status: Abnormal   Collection Time: 02/21/16  6:20 AM  Result Value Ref Range Status   MRSA by PCR POSITIVE (A) NEGATIVE Final    Comment:        The GeneXpert MRSA Assay (FDA approved for NASAL specimens only), is one component of a comprehensive MRSA colonization surveillance program. It is not intended to diagnose MRSA infection nor to guide or monitor treatment for MRSA infections. RESULT CALLED TO, READ BACK BY AND VERIFIED WITH: Y. PINEDA 0850 01.25.18 N. MORRIS          Radiology Studies: No results found.      Scheduled Meds: . allopurinol  100 mg Oral BID  . amiodarone  200 mg Oral Daily  . aspirin EC  81 mg Oral Daily  . atorvastatin  40 mg Oral QHS  . Chlorhexidine Gluconate Cloth  6 each Topical Q0600  . doxercalciferol  4 mcg Intravenous Q M,W,F-HD  . heparin  5,000 Units Subcutaneous Q8H  . hydrocortisone sod succinate (SOLU-CORTEF) inj  50 mg Intravenous Q6H  . midodrine  10 mg Oral TID WC  . mupirocin ointment  1 application Nasal BID  . saccharomyces boulardii  250 mg Oral BID  . sevelamer carbonate  800 mg Oral TID WC   Continuous Infusions:   LOS: 2 days    Time spent:    Zannie Cove, MD [name] Pager [phone]  If 7PM-7AM, please contact  night-coverage [email or web address] Password [email or web address] 02/23/2016, 11:05 AM

## 2016-02-24 LAB — BLOOD GAS, ARTERIAL
Acid-base deficit: 1.3 mmol/L (ref 0.0–2.0)
Bicarbonate: 23 mmol/L (ref 20.0–28.0)
DRAWN BY: 213381
FIO2: 0.21
O2 SAT: 93.8 %
PCO2 ART: 38.9 mmHg (ref 32.0–48.0)
PH ART: 7.389 (ref 7.350–7.450)
PO2 ART: 73.9 mmHg — AB (ref 83.0–108.0)
Patient temperature: 98.6

## 2016-02-24 LAB — CBC
HEMATOCRIT: 40.9 % (ref 39.0–52.0)
HEMOGLOBIN: 13.2 g/dL (ref 13.0–17.0)
MCH: 31.3 pg (ref 26.0–34.0)
MCHC: 32.3 g/dL (ref 30.0–36.0)
MCV: 96.9 fL (ref 78.0–100.0)
Platelets: 160 10*3/uL (ref 150–400)
RBC: 4.22 MIL/uL (ref 4.22–5.81)
RDW: 17.5 % — AB (ref 11.5–15.5)
WBC: 12.5 10*3/uL — AB (ref 4.0–10.5)

## 2016-02-24 LAB — BASIC METABOLIC PANEL
ANION GAP: 11 (ref 5–15)
BUN: 38 mg/dL — AB (ref 6–20)
CHLORIDE: 95 mmol/L — AB (ref 101–111)
CO2: 24 mmol/L (ref 22–32)
Calcium: 8.6 mg/dL — ABNORMAL LOW (ref 8.9–10.3)
Creatinine, Ser: 6.68 mg/dL — ABNORMAL HIGH (ref 0.61–1.24)
GFR calc Af Amer: 9 mL/min — ABNORMAL LOW (ref >60)
GFR, EST NON AFRICAN AMERICAN: 8 mL/min — AB (ref >60)
Glucose, Bld: 123 mg/dL — ABNORMAL HIGH (ref 65–99)
POTASSIUM: 4.7 mmol/L (ref 3.5–5.1)
SODIUM: 130 mmol/L — AB (ref 135–145)

## 2016-02-24 LAB — GLUCOSE, CAPILLARY: Glucose-Capillary: 132 mg/dL — ABNORMAL HIGH (ref 65–99)

## 2016-02-24 MED ORDER — DOCUSATE SODIUM 100 MG PO CAPS
100.0000 mg | ORAL_CAPSULE | Freq: Two times a day (BID) | ORAL | Status: DC
  Administered 2016-02-24 – 2016-02-25 (×3): 100 mg via ORAL
  Filled 2016-02-24 (×3): qty 1

## 2016-02-24 MED ORDER — HYDROCORTISONE NA SUCCINATE PF 100 MG IJ SOLR: 50.0000 mg | Freq: Two times a day (BID) | INTRAMUSCULAR | Status: DC

## 2016-02-24 MED ORDER — HYDROCORTISONE NA SUCCINATE PF 100 MG IJ SOLR
25.0000 mg | Freq: Two times a day (BID) | INTRAMUSCULAR | Status: DC
  Administered 2016-02-24 – 2016-02-25 (×3): 25 mg via INTRAVENOUS
  Filled 2016-02-24 (×3): qty 2

## 2016-02-24 NOTE — Progress Notes (Signed)
PROGRESS NOTE    Chad [name]  AVW:[id number] DOB: [birth year] DOA: 02/20/2016 PCP: [name], MD  Brief Narrative: 60/M with with a history of nonischemic cardiomyopathy Ef 15-20%, paroxysmal atrial flutter not on anticoagulation secondary to GI bleed, ESRD (MWF) with failed renal transplant (1996), hepatitis C, and H/o Femur fracture, s/o multiple surgeries then had osteomyelitis, s/p Abx therapy last year, now wheel chair bound presented to the ED with Hypotension and abd pain after HD. Pt reports that prednisone weaned down to 5mg  and not refilled recently but had a few left which he was taking . S/p close to 4L in ER, mentation at baseline. Ct Chest abd/pelvis unremarkable  Assessment & Plan:    Hypotension -suspect multifactorial, ESRD with fluid removal, chronically on midodrine and Adrenal insufficiency -suspect component of AI, given chronic prednisone use with recently discontinuation. -Random cortisol 5.9,  Remains on stress dose steroids without much response, will cut down dose -I cannot do a Stim test since he was started on stress dose steroids on admission, will need to be pursued once he's weaned off steroids down the road -Coreg on hold -Blood Cx negative, afebrile, stopped Vanc/Zosyn -CT chest/abd pelvis negative for source of infection, FLu negative, no fevers -EF improved to 55% now -clinically improved although BP still soft, but improved  Dyspnea -suspect primarily has restrictive lung disease, severely kyphotic spine -would benefit from Pulm FU and PFTS -he had a sleep study many years ago which was reportedly ok -ECHo with improved EF  NICM/ H/o Dilated cardiomyopathy EF of 15-20% -mildly elevated troponin  In setting of hypotension, clinically no evidence of ACS -ECHO with EF improved to 55% now -holding coreg, volume status managed with HD   OSA (obstructive sleep apnea)  -Paroxysmal Atrial flutter  -NSR now, contine amiodarone.  -not on  anticoagulation due to history of GI bleed.    ESRD on dialysis (HCC) -on HD MWF, last HD yesterday, now s/p 4L IVF  Anterior mediastinal density - further work up as outpatient.  Thrombocytopenia - Chronic. Follow CBC.  DVT prophylaxis: Heparin Code Status: Fullcode.  Family Communication: None at bedside Disposition Plan: Home tomorrow if Bp stable  Consultants:   Renal  Antimicrobials: Vanc/Zosyn 1/25  Subjective: Feels well, no complaints  Objective: Vitals:   02/23/16 1020 02/23/16 1828 02/23/16 2253 02/24/16 0516  BP: (!) 74/48 (!) 89/59 96/69 90/77   Pulse:  77 70 70  Resp:  20 18 16   Temp:  98.4 F (36.9 C) 97.7 F (36.5 C) 98.6 F (37 C)  TempSrc:  Oral Oral Oral  SpO2:  96% 95% 94%  Weight:      Height:        Intake/Output Summary (Last 24 hours) at 02/24/16 1139 Last data filed at 02/24/16 0631  Gross per 24 hour  Intake              460 ml  Output               10 ml  Net              450 ml   Filed Weights   02/22/16 0500 02/22/16 1400 02/22/16 1809  Weight: 107.5 kg (236 lb 15.9 oz) 106.9 kg (235 lb 10.8 oz) 106.2 kg (234 lb 2.1 oz)    Examination:  General exam: Appears calm and comfortable, AAOX3, No distress Respiratory system: Clear to auscultation. Respiratory effort normal. Cardiovascular system: S1 & S2 heard, RRR. No JVD, murmurs Gastrointestinal system:  Abdomen is nondistended, soft and nontender. Normal bowel sounds heard. Central nervous system: Alert and oriented. No focal neurological deficits. Extremities: L femur with deformity and LLE, L arm AVF Skin: No rashes, lesions or ulcers Psychiatry: Judgement and insight appear normal. Mood & affect appropriate.     Data Reviewed: I have personally reviewed following labs and imaging studies  CBC:  Recent Labs Lab 02/20/16 1556 02/21/16 0156 02/21/16 0502 02/22/16 1427 02/24/16 0624  WBC 7.0 6.3 7.2 11.9* 12.5*  NEUTROABS 4.2 3.8 5.0  --   --   HGB 14.6 12.6* 13.3  12.9* 13.2  HCT 44.6 40.3 42.2 39.9 40.9  MCV 98.0 99.5 99.3 97.1 96.9  PLT 128* 112* 107* 132* 160   Basic Metabolic Panel:  Recent Labs Lab 02/20/16 1556 02/21/16 0502 02/22/16 1427 02/24/16 0624  NA 132* 135 131* 130*  K 4.1 4.4 4.7 4.7  CL 93* 100* 99* 95*  CO2 26 24 19* 24  GLUCOSE 73 86 141* 123*  BUN 11 12 28* 38*  CREATININE 3.91* 4.57* 6.65* 6.68*  CALCIUM 8.5* 8.4* 8.9 8.6*  PHOS  --   --  4.9*  --    GFR: Estimated Creatinine Clearance: 15.2 mL/min (by C-G formula based on SCr of 6.68 mg/dL (H)). Liver Function Tests:  Recent Labs Lab 02/20/16 1556 02/21/16 0502 02/22/16 1427  AST 18 15  --   ALT 8* 5*  --   ALKPHOS 85 66  --   BILITOT 1.4* 0.9  --   PROT 7.9 6.7  --   ALBUMIN 2.9* 2.2* 2.2*   No results for input(s): LIPASE, AMYLASE in the last 168 hours. No results for input(s): AMMONIA in the last 168 hours. Coagulation Profile: No results for input(s): INR, PROTIME in the last 168 hours. Cardiac Enzymes:  Recent Labs Lab 02/21/16 0156 02/21/16 0746 02/21/16 1354  TROPONINI 0.13* 0.18* 0.15*   BNP (last 3 results) No results for input(s): PROBNP in the last 8760 hours. HbA1C: No results for input(s): HGBA1C in the last 72 hours. CBG:  Recent Labs Lab 02/23/16 0820  GLUCAP 124*   Lipid Profile: No results for input(s): CHOL, HDL, LDLCALC, TRIG, CHOLHDL, LDLDIRECT in the last 72 hours. Thyroid Function Tests: No results for input(s): TSH, T4TOTAL, FREET4, T3FREE, THYROIDAB in the last 72 hours. Anemia Panel: No results for input(s): VITAMINB12, FOLATE, FERRITIN, TIBC, IRON, RETICCTPCT in the last 72 hours. Urine analysis:    Component Value Date/Time   COLORURINE YELLOW 03/18/2014 1730   APPEARANCEUR CLEAR 03/18/2014 1730   LABSPEC 1.013 03/18/2014 1730   PHURINE 5.0 03/18/2014 1730   GLUCOSEU NEGATIVE 03/18/2014 1730   HGBUR NEGATIVE 03/18/2014 1730   BILIRUBINUR NEGATIVE 03/18/2014 1730   KETONESUR NEGATIVE 03/18/2014 1730     PROTEINUR 30 (A) 03/18/2014 1730   UROBILINOGEN 0.2 03/18/2014 1730   NITRITE NEGATIVE 03/18/2014 1730   LEUKOCYTESUR NEGATIVE 03/18/2014 1730   Sepsis Labs: @LABRCNTIP (procalcitonin:4,lacticidven:4)  ) Recent Results (from the past 240 hour(s))  Culture, blood (Routine X 2) w Reflex to ID Panel     Status: None (Preliminary result)   Collection Time: 02/20/16  6:10 PM  Result Value Ref Range Status   Specimen Description BLOOD RIGHT ANTECUBITAL  Final   Special Requests BOTTLES DRAWN AEROBIC AND ANAEROBIC 5CC  Final   Culture NO GROWTH 3 DAYS  Final   Report Status PENDING  Incomplete  Culture, blood (Routine X 2) w Reflex to ID Panel     Status: None (Preliminary  result)   Collection Time: 02/20/16  6:54 PM  Result Value Ref Range Status   Specimen Description BLOOD RIGHT HAND  Final   Special Requests BOTTLES DRAWN AEROBIC AND ANAEROBIC 5CC  Final   Culture NO GROWTH 3 DAYS  Final   Report Status PENDING  Incomplete  MRSA PCR Screening     Status: Abnormal   Collection Time: 02/21/16  6:20 AM  Result Value Ref Range Status   MRSA by PCR POSITIVE (A) NEGATIVE Final    Comment:        The GeneXpert MRSA Assay (FDA approved for NASAL specimens only), is one component of a comprehensive MRSA colonization surveillance program. It is not intended to diagnose MRSA infection nor to guide or monitor treatment for MRSA infections. RESULT CALLED TO, READ BACK BY AND VERIFIED WITH: Y. PINEDA 0932 01.25.18 Joesph July          Radiology Studies: Dg Chest Port 1 View  Result Date: 02/23/2016 CLINICAL DATA:  Short of breath. EXAM: PORTABLE CHEST 1 VIEW COMPARISON:  02/20/2016 FINDINGS: Lung volumes are low. There is opacity at both lung bases similar to the prior exam, consistent with atelectasis. No convincing pneumonia. No pulmonary edema. Heart is enlarged but stable.  No gross mediastinal or hilar masses. No convincing pleural effusion.  No pneumothorax. Skeletal structures  are demineralized but grossly intact. IMPRESSION: 1. Low lung volumes similar to the prior exam. Lung base opacity is noted bilaterally also stable consistent with atelectasis. No convincing pneumonia or pulmonary edema. Stable cardiomegaly. Electronically Signed   By: Amie Portland M.D.   On: 02/23/2016 11:45        Scheduled Meds: . allopurinol  100 mg Oral BID  . amiodarone  200 mg Oral Daily  . aspirin EC  81 mg Oral Daily  . atorvastatin  40 mg Oral QHS  . Chlorhexidine Gluconate Cloth  6 each Topical Q0600  . doxercalciferol  4 mcg Intravenous Q M,W,F-HD  . heparin  5,000 Units Subcutaneous Q8H  . hydrocortisone sod succinate (SOLU-CORTEF) inj  50 mg Intravenous Q6H  . midodrine  10 mg Oral TID WC  . mupirocin ointment  1 application Nasal BID  . saccharomyces boulardii  250 mg Oral BID  . sevelamer carbonate  800 mg Oral TID WC   Continuous Infusions:   LOS: 3 days    Time spent:    Zannie Cove, MD [name] Pager [phone]  If 7PM-7AM, please contact night-coverage [email or web address] Password [facility] 02/24/2016, 11:39 AM

## 2016-02-24 NOTE — Progress Notes (Signed)
Patient requesting stool softner. MD notified.

## 2016-02-24 NOTE — Progress Notes (Signed)
[name] Progress Note  Dialysis Orders: [facility] MWF 4h 2/2 bath 101kg Hep 8000 then 2000prn LUA AVF - Hect 4 ug - labs: pth 593, P 3.7 Ca 10.3 Hb 13  Assessment/Plan: 1. Hypotension/volume - possible adrenal insufficiency - prior chronic prednisone use related to kidney transplant also severe DCM, no pericard effusion, some valvular heart disease EF 55%; EDW raised significantly - BP still low - he had been inconsistent with midodrine prior to admission because he didn't fee like it helped - on 10 tid now - feels much better even though BP are still low; net UF 300 on Friday - post weight 106.2 - on IV stress steroids. No evidence of infection. Per Dr. Arlean Hopping, "The main reason we are trying to pull fluid on this person is recurrent problems with "SOB" while on dialysis, these episodes happen when "I am lying down" and he has to sit up and catch his breath.  No vol excess here on exam or CXR on admission.  Now is several kg up from dry wt and lungs still remain clear, repeat CXR still no edema, markedly low lung volumes due to obesity/ kyphosis.  Repeat CXR 1/27 neg for volume; has low lung volume; he is very kyphotic with poor expansion, check ABG, pursue pulmonary etiology - had associated abdominal pain with low BP/SOB when placed in Trendelenburg at dialysis- possible that abdominal pain caused SOB or abd pain was due to mesenteric ischemia due to low BP - either way he DID not have abdominal pain during his dialysis Friday when he was at a higher EDW AND BP was still low. BP a little better today 2. ESRD -MWF - next HD Monday; determine new EDW for d/c Na low with raising of EDW 3. Anemia - hgb 13.2 - not on ESA  4. Secondary hyperparathyroidism - continue current binders/VDRA 5. Nutrition - severe protein malnutrition alb 2.2 -discussed ^ protein intake  6. PAF - on amio/no anticoag due to hx GIB, off coreg 7. Dispo - stable for dc from renal standpoint unless  needs further inpt w/u for lung disease  Sheffield Slider, PA-C [facility] Beeper [phone] 02/24/2016,8:13 AM  LOS: 3 days   Pt seen, examined, agree w assess/plan as above with additions as indicated. Patient has the appearance of restrictive lung disease due to OSH/ kyphosis. Not hypoxic. Ordered ABG.  We have raised dry wt to 106kg and CXR still clear.  If we can get another diagnosis for his recurrent c/o SOB as an outpatient , maybe this will help [place] to avoid overultrafiltration and associated vol depletion/ hypotension that comes from trying to treat his SOB with fluid removal.   Vinson Moselle MD [facility] pager [phone]    cell [phone] 02/24/2016, 10:40 AM     Subjective:   No SOB or abdominal pain, feels fine  Objective Vitals:   02/23/16 1020 02/23/16 1828 02/23/16 2253 02/24/16 0516  BP: (!) 74/48 (!) 89/59 96/69 90/77   Pulse:  77 70 70  Resp:  20 18 16   Temp:  98.4 F (36.9 C) 97.7 F (36.5 C) 98.6 F (37 C)  TempSrc:  Oral Oral Oral  SpO2:  96% 95% 94%  Weight:      Height:       Physical Exam General: NAD obese kyphotic sitting in bed talking on the phone Heart: RRR Lungs: dim bases, no rales Abdomen: obese soft NT Extremities: ++ LE edema Dialysis Access:  Left upper  AVF + bruit   Additional Objective Labs: Basic Metabolic Panel:  Recent Labs Lab 02/21/16 0502 02/22/16 1427 02/24/16 0624  NA 135 131* 130*  K 4.4 4.7 4.7  CL 100* 99* 95*  CO2 24 19* 24  GLUCOSE 86 141* 123*  BUN 12 28* 38*  CREATININE 4.57* 6.65* 6.68*  CALCIUM 8.4* 8.9 8.6*  PHOS  --  4.9*  --    Liver Function Tests:  Recent Labs Lab 02/20/16 1556 02/21/16 0502 02/22/16 1427  AST 18 15  --   ALT 8* 5*  --   ALKPHOS 85 66  --   BILITOT 1.4* 0.9  --   PROT 7.9 6.7  --   ALBUMIN 2.9* 2.2* 2.2*   CBC:  Recent Labs Lab 02/20/16 1556 02/21/16 0156 02/21/16 0502 02/22/16 1427 02/24/16 0624  WBC 7.0 6.3 7.2 11.9* 12.5*   NEUTROABS 4.2 3.8 5.0  --   --   HGB 14.6 12.6* 13.3 12.9* 13.2  HCT 44.6 40.3 42.2 39.9 40.9  MCV 98.0 99.5 99.3 97.1 96.9  PLT 128* 112* 107* 132* 160   Blood Culture    Component Value Date/Time   SDES BLOOD RIGHT HAND 02/20/2016 1854   SPECREQUEST BOTTLES DRAWN AEROBIC AND ANAEROBIC 5CC 02/20/2016 1854   CULT NO GROWTH 3 DAYS 02/20/2016 1854   REPTSTATUS PENDING 02/20/2016 1854    Cardiac Enzymes:  Recent Labs Lab 02/21/16 0156 02/21/16 0746 02/21/16 1354  TROPONINI 0.13* 0.18* 0.15*   CBG:  Recent Labs Lab 02/23/16 0820  GLUCAP 124*   Studies/Results: Dg Chest Port 1 View  Result Date: 02/23/2016 CLINICAL DATA:  Short of breath. EXAM: PORTABLE CHEST 1 VIEW COMPARISON:  02/20/2016 FINDINGS: Lung volumes are low. There is opacity at both lung bases similar to the prior exam, consistent with atelectasis. No convincing pneumonia. No pulmonary edema. Heart is enlarged but stable.  No gross mediastinal or hilar masses. No convincing pleural effusion.  No pneumothorax. Skeletal structures are demineralized but grossly intact. IMPRESSION: 1. Low lung volumes similar to the prior exam. Lung base opacity is noted bilaterally also stable consistent with atelectasis. No convincing pneumonia or pulmonary edema. Stable cardiomegaly. Electronically Signed   By: Amie Portland M.D.   On: 02/23/2016 11:45   Medications:  . allopurinol  100 mg Oral BID  . amiodarone  200 mg Oral Daily  . aspirin EC  81 mg Oral Daily  . atorvastatin  40 mg Oral QHS  . Chlorhexidine Gluconate Cloth  6 each Topical Q0600  . doxercalciferol  4 mcg Intravenous Q M,W,F-HD  . heparin  5,000 Units Subcutaneous Q8H  . hydrocortisone sod succinate (SOLU-CORTEF) inj  50 mg Intravenous Q6H  . midodrine  10 mg Oral TID WC  . mupirocin ointment  1 application Nasal BID  . saccharomyces boulardii  250 mg Oral BID  . sevelamer carbonate  800 mg Oral TID WC

## 2016-02-25 LAB — CBC
HCT: 42.3 % (ref 39.0–52.0)
Hemoglobin: 13.8 g/dL (ref 13.0–17.0)
MCH: 31.4 pg (ref 26.0–34.0)
MCHC: 32.6 g/dL (ref 30.0–36.0)
MCV: 96.4 fL (ref 78.0–100.0)
Platelets: 181 10*3/uL (ref 150–400)
RBC: 4.39 MIL/uL (ref 4.22–5.81)
RDW: 17.3 % — ABNORMAL HIGH (ref 11.5–15.5)
WBC: 13.4 10*3/uL — ABNORMAL HIGH (ref 4.0–10.5)

## 2016-02-25 LAB — RENAL FUNCTION PANEL
Albumin: 2.3 g/dL — ABNORMAL LOW (ref 3.5–5.0)
Anion gap: 13 (ref 5–15)
BUN: 52 mg/dL — ABNORMAL HIGH (ref 6–20)
CO2: 24 mmol/L (ref 22–32)
Calcium: 8.7 mg/dL — ABNORMAL LOW (ref 8.9–10.3)
Chloride: 94 mmol/L — ABNORMAL LOW (ref 101–111)
Creatinine, Ser: 7.75 mg/dL — ABNORMAL HIGH (ref 0.61–1.24)
GFR calc Af Amer: 8 mL/min — ABNORMAL LOW (ref >60)
GFR calc non Af Amer: 7 mL/min — ABNORMAL LOW (ref >60)
Glucose, Bld: 96 mg/dL (ref 65–99)
Phosphorus: 4.6 mg/dL (ref 2.5–4.6)
Potassium: 4.9 mmol/L (ref 3.5–5.1)
Sodium: 131 mmol/L — ABNORMAL LOW (ref 135–145)

## 2016-02-25 LAB — CULTURE, BLOOD (ROUTINE X 2)
Culture: NO GROWTH
Culture: NO GROWTH

## 2016-02-25 MED ORDER — PENTAFLUOROPROP-TETRAFLUOROETH EX AERO: 1.0000 "application " | INHALATION_SPRAY | CUTANEOUS | Status: DC | PRN

## 2016-02-25 MED ORDER — LIDOCAINE HCL (PF) 1 % IJ SOLN: 5.0000 mL | INTRAMUSCULAR | Status: DC | PRN

## 2016-02-25 MED ORDER — HYDROCORTISONE 5 MG PO TABS: 5.0000 mg | ORAL_TABLET | Freq: Two times a day (BID) | ORAL | 0 refills | Status: AC

## 2016-02-25 MED ORDER — MIDODRINE HCL 5 MG PO TABS: 10.0000 mg | ORAL_TABLET | Freq: Three times a day (TID) | ORAL | 0 refills | Status: AC

## 2016-02-25 MED ORDER — SODIUM CHLORIDE 0.9 % IV SOLN: 100.0000 mL | INTRAVENOUS | Status: DC | PRN

## 2016-02-25 MED ORDER — ALTEPLASE 2 MG IJ SOLR: 2.0000 mg | Freq: Once | INTRAMUSCULAR | Status: DC | PRN

## 2016-02-25 MED ORDER — HEPARIN SODIUM (PORCINE) 1000 UNIT/ML DIALYSIS
8000.0000 [IU] | Freq: Once | INTRAMUSCULAR | Status: DC
  Filled 2016-02-25: qty 8

## 2016-02-25 MED ORDER — LIDOCAINE-PRILOCAINE 2.5-2.5 % EX CREA: 1.0000 "application " | TOPICAL_CREAM | CUTANEOUS | Status: DC | PRN

## 2016-02-25 MED ORDER — HEPARIN SODIUM (PORCINE) 1000 UNIT/ML DIALYSIS: 1000.0000 [IU] | INTRAMUSCULAR | Status: DC | PRN

## 2016-02-25 MED ORDER — DOXERCALCIFEROL 4 MCG/2ML IV SOLN
INTRAVENOUS | Status: AC
  Administered 2016-02-25: 4 ug via INTRAVENOUS
  Filled 2016-02-25: qty 2

## 2016-02-25 MED ORDER — MIDODRINE HCL 5 MG PO TABS
ORAL_TABLET | ORAL | Status: AC
  Filled 2016-02-25: qty 2

## 2016-02-25 NOTE — Progress Notes (Signed)
Physical Therapy Treatment Patient Details Name: Chad [name] MRN: [id number] DOB: 1956/01/20 Today's Date: 02/25/2016    History of Present Illness pt presents with hypotension.  pt with hx of ESRD, Kidney Transplant, DM, Hep C, CKD, Gout, Shingles, HTN, and multiple L Femur surgeries s/p fx.      PT Comments    Pt states he ready to head home.  Emphasis on  Active resistive exercise and standing/pregait activity.  Follow Up Recommendations  Home health PT;Supervision - Intermittent     Equipment Recommendations  None recommended by PT    Recommendations for Other Services       Precautions / Restrictions      Mobility  Bed Mobility Overal bed mobility: Needs Assistance Bed Mobility: Supine to Sit     Supine to sit: Supervision        Transfers Overall transfer level: Needs assistance Equipment used: Rolling walker (2 wheeled) Transfers: Sit to/from Stand Sit to Stand: Min assist;Min guard         General transfer comment: cues for hand placement so pt could stand without assist  Ambulation/Gait Ambulation/Gait assistance: Mod assist Ambulation Distance (Feet): 1 Feet (forward and back) Assistive device: Rolling walker (2 wheeled) Gait Pattern/deviations: Step-to pattern     General Gait Details: pregait at EOB, stepping forward and back, pt not confident bearing weight on left leg.   Stairs            Wheelchair Mobility    Modified Rankin (Stroke Patients Only)       Balance Overall balance assessment: Needs assistance   Sitting balance-Leahy Scale: Good     Standing balance support: Bilateral upper extremity supported;During functional activity Standing balance-Leahy Scale: Poor Standing balance comment: reliant on the RW                    Cognition Arousal/Alertness: Awake/alert Behavior During Therapy: WFL for tasks assessed/performed Overall Cognitive Status: Within Functional Limits for tasks assessed                       Exercises General Exercises - Lower Extremity Heel Slides: AROM;Strengthening;Both;10 reps;Supine (graded resistance) Straight Leg Raises: AROM;Strengthening;Both;10 reps;Supine Other Exercises Other Exercises: bridging x10 reps Other Exercises: hip IR x10 reps resisted Other Exercises: bicep/tricep press x 10 reps resisted.    General Comments        Pertinent Vitals/Pain Pain Assessment: Faces Faces Pain Scale: Hurts little more Pain Location: joints Pain Descriptors / Indicators: Grimacing Pain Intervention(s): Monitored during session    Home Living                      Prior Function            PT Goals (current goals can now be found in the care plan section) Acute Rehab PT Goals Patient Stated Goal: Home PT Goal Formulation: With patient Time For Goal Achievement: 03/07/16 Potential to Achieve Goals: Good Progress towards PT goals: Progressing toward goals    Frequency    Min 3X/week      PT Plan Current plan remains appropriate    Co-evaluation             End of Session Equipment Utilized During Treatment: Gait belt Activity Tolerance: Patient tolerated treatment well Patient left: in bed;with call bell/phone within reach (sittibng EOB putting on his clothes)     Time: 2902-1115 PT Time Calculation (min) (ACUTE ONLY): 26 min  Charges:  $  Therapeutic Exercise: 8-22 mins $Therapeutic Activity: 8-22 mins                    G CodesSamier [name] Chad [name] 02/25/2016, 6:05 PM 02/25/2016  Chad [name], PT [phone]  (pager)

## 2016-02-25 NOTE — Care Management Note (Signed)
Case Management Note  Patient Details  Name: Chad [name] MRN: [id number] Date of Birth: Oct 25, 1955  Subjective/Objective:      CM following for progression and d/c planning.               Action/Plan: CM met with pt who states that he currently has HHPT with [facility] on [address]. This CM contacted [facility] to continue services.   Expected Discharge Date:                  Expected Discharge Plan:  [place]  In-House Referral:  NA  Discharge planning Services  CM Consult  Post Acute Care Choice:  Resumption of Svcs/PTA Provider Choice offered to:     DME Arranged:    DME Agency:     HH Arranged:    [place] Agency:  [place]  Status of Service:  Completed, signed off  If discussed at [place] of Stay Meetings, dates discussed:    Additional Comments:  Adron Bene, RN 02/25/2016, 3:00 PM

## 2016-02-25 NOTE — Progress Notes (Signed)
[facility] Progress Note    Assessment/ Plan:   Dialysis Orders: [facility] MWF 4h 2/2 bath 101kg Hep 8000 then 2000prn LUA AVF - Hect 4 ug - labs: pth 593, P 3.7 Ca 10.3 Hb 13   1. Hypotension/volume -possible adrenal insufficiency - prior chronic prednisone use related to kidney transplantalso severe DCM, no pericard effusion, some valvular heart disease EF 55%; EDW raised significantly - BP still low - he had been inconsistent with midodrine prior to admission because he didn't fee like it helped - on 10 tid now - feels much better even though BP are still low; net UF 300 on Friday - post weight 106.2 - on IV stress steroids. No evidence of infection. Per Dr. Arlean Hopping, "The main reason we are trying to pull fluid on this person is recurrent problems with "SOB" while on dialysis, these episodes happen when "I am lying down" and he has to sit up and catch his breath. No vol excess here on exam or CXR on admission. Now is several kg up from dry wt (101kg) and lungs still remain clear, repeat CXR still no edema, markedly low lung volumes due to obesity/ kyphosis.   - Repeat CXR 1/27 neg for volume; has low lung volume; he is very kyphotic with poor expansion, check ABG, pursue pulmonary etiology (restrictive lung disease?) - had associated abdominal pain with low BP/SOB when placed in Trendelenburg at dialysis- possible that abdominal pain caused SOB or abd pain was due to mesenteric ischemia due to low BP - either way he DID not have abdominal pain during his dialysis Friday when he was at a higher EDW AND BP was still low.  - no complaints prior to HD; per pt he did not have the leg swelling in the 1st few mths of hd 2. ESRD-MWF - - Seen on HD 02/25/16 @ 850A; we will need to determine new EDW and this will likely not be determined for many weeks (as outpt) as slow gentle challenging of his EDW will be indicated -  2K 2Ca 105/75 HR 71 AP/VP -190/210 3.5 liters UF  tolerating w/ no complaints. 3. Anemia- hgb 13.2 - not on ESA  4. Secondary hyperparathyroidism- continue current binders/VDRA 5. Nutrition- severe protein malnutrition alb 2.2 -discussed ^ protein intake  6. PAF -on amio/no anticoag due to hx GIB, off coreg 7. Dispo - stable for dc from renal standpoint unless needs further inpt w/u for lung disease   Subjective:   Feels better and denied dyspnea /f/c/n/v. Also denies CP.   Objective:   BP 105/75   Pulse 71   Temp 97.5 F (36.4 C) (Oral)   Resp 18   Ht 6\' 1"  (1.854 m)   Wt 109 kg (240 lb 4.8 oz)   SpO2 98%   BMI 31.70 kg/m   Intake/Output Summary (Last 24 hours) at 02/25/16 0833 Last data filed at 02/25/16 0610  Gross per 24 hour  Intake              480 ml  Output                0 ml  Net              480 ml   Weight change:   Physical Exam: Alert, no distress, chron ill No jvd Chest clear bilat to bases RRR; lt BCF - pulsatile towards inflow but positive bruit Abd obese soft ntnd Ext trace - 1-2+ edema below the knees,  chron bilat venous stasis skin changes LE's Neuro alert and ox 3, gen'd weakness  Imaging: Dg Chest Port 1 View  Result Date: 02/23/2016 CLINICAL DATA:  Short of breath. EXAM: PORTABLE CHEST 1 VIEW COMPARISON:  02/20/2016 FINDINGS: Lung volumes are low. There is opacity at both lung bases similar to the prior exam, consistent with atelectasis. No convincing pneumonia. No pulmonary edema. Heart is enlarged but stable.  No gross mediastinal or hilar masses. No convincing pleural effusion.  No pneumothorax. Skeletal structures are demineralized but grossly intact. IMPRESSION: 1. Low lung volumes similar to the prior exam. Lung base opacity is noted bilaterally also stable consistent with atelectasis. No convincing pneumonia or pulmonary edema. Stable cardiomegaly. Electronically Signed   By: Amie Portland M.D.   On: 02/23/2016 11:45    Labs: BMET  Recent Labs Lab 02/20/16 1556 02/21/16 0502  02/22/16 1427 02/24/16 0624 02/25/16 0719  NA 132* 135 131* 130* 131*  K 4.1 4.4 4.7 4.7 4.9  CL 93* 100* 99* 95* 94*  CO2 26 24 19* 24 24  GLUCOSE 73 86 141* 123* 96  BUN 11 12 28* 38* 52*  CREATININE 3.91* 4.57* 6.65* 6.68* 7.75*  CALCIUM 8.5* 8.4* 8.9 8.6* 8.7*  PHOS  --   --  4.9*  --  4.6   CBC  Recent Labs Lab 02/20/16 1556 02/21/16 0156 02/21/16 0502 02/22/16 1427 02/24/16 0624 02/25/16 0412  WBC 7.0 6.3 7.2 11.9* 12.5* 13.4*  NEUTROABS 4.2 3.8 5.0  --   --   --   HGB 14.6 12.6* 13.3 12.9* 13.2 13.8  HCT 44.6 40.3 42.2 39.9 40.9 42.3  MCV 98.0 99.5 99.3 97.1 96.9 96.4  PLT 128* 112* 107* 132* 160 181    Medications:    . allopurinol  100 mg Oral BID  . amiodarone  200 mg Oral Daily  . aspirin EC  81 mg Oral Daily  . atorvastatin  40 mg Oral QHS  . Chlorhexidine Gluconate Cloth  6 each Topical Q0600  . docusate sodium  100 mg Oral BID  . doxercalciferol  4 mcg Intravenous Q M,W,F-HD  . heparin  5,000 Units Subcutaneous Q8H  . heparin  8,000 Units Dialysis Once in dialysis  . hydrocortisone sod succinate (SOLU-CORTEF) inj  25 mg Intravenous Q12H  . midodrine  10 mg Oral TID WC  . mupirocin ointment  1 application Nasal BID  . saccharomyces boulardii  250 mg Oral BID  . sevelamer carbonate  800 mg Oral TID WC      Paulene Floor, MD 02/25/2016, 8:33 AM

## 2016-02-25 NOTE — Progress Notes (Signed)
OT Cancellation Note  Patient Details Name: Chad [name] MRN: [id number] DOB: [birth year]   Cancelled Treatment:    Reason Eval/Treat Not Completed: Medical issues which prohibited therapy (HD currently)  Felecia Shelling   OTR/L Pager: [phone] Office: [phone] .  02/25/2016, 8:01 AM

## 2016-02-25 NOTE — Discharge Summary (Addendum)
Physician Discharge Summary  Chad [name]:[id number] DOB: April 18, 1955 DOA: 02/20/2016  PCP: [name], MD  Admit date: 02/20/2016 Discharge date: 02/25/2016  Time spent: 35 minutes  Recommendations for Outpatient Follow-up:  1. PCP in 1 week, wean Midodrine as tolerated, raised dry weight, Caution with extra fluid removal on HD based on symptoms since some of his dyspnea is likely related to restrictive lung disease when laying down. Anterior mediastinal density needs further workup  2. Endocrine Dr.Cristina Gherghe in 1-2weeks, needs to be weaned off hydrocortisone and then needs Stim test  Discharge Diagnoses:  Principal Problem:   Hypotension   Restrictive lung disease   Dilated cardiomyopathy (HCC)   OSA (obstructive sleep apnea)   PAF (paroxysmal atrial fibrillation) (HCC)   ESRD on dialysis [facility])   S/p Renal Transplant  Discharge Condition: stable  Diet recommendation: Renal  Filed Weights   02/22/16 1809 02/25/16 0810 02/25/16 1236  Weight: 106.2 kg (234 lb 2.1 oz) 109 kg (240 lb 4.8 oz) 106.2 kg (234 lb 2.1 oz)    History of present illness:  61/M with with a history of nonischemic cardiomyopathy Ef 15-20%, paroxysmal atrial flutter not on anticoagulation secondary to GI bleed, ESRD (MWF) with failed renal transplant (1996), hepatitis C, and H/o Femur fracture, s/o multiple surgeries then had osteomyelitis, s/p Abx therapy last year, now wheel chair bound presented to the ED with Hypotension and abd pain after HD. Pt reports that prednisone weaned down to 5mg  and not refilled recently but had a few left which he was taking.   Hospital Course:   Hypotension -suspect multifactorial, ESRD with fluid removal, chronically on midodrine and possibly Adrenal insufficiency -suspect component of AI, given chronic prednisone use from his transplant meds 20years ago with recently discontinuation. -Random cortisol 5.9,  He was started on stress dose steroids on admission,  without much response hence we have been cutting down the dose -I cannot do a Stim test since he was started on high dose IV Hydrocortisone on admission, will need to be pursued once he's weaned off steroids down the road -Coreg stopped -Blood Cx negative, afebrile, stopped Vanc/Zosyn -CT chest/abd pelvis negative for source of infection, FLu negative, no fevers -ECHo checked EF improved to 55% now -clinically improved although BP still soft, but improved, in 90s now and asymptomatic, discharged home and requested Endocrinology FU to Wean Hydrocortisone and then do an ACTH stim test.  Dyspnea -suspect primarily has restrictive lung disease, severely kyphotic spine -would benefit from Pulm FU and PFTS -he had a sleep study many years ago which was reportedly ok -ECHo with improved EF -not volume overloaded despite being above dry weight  NICM/ H/o Dilated cardiomyopathy EF of 15-20% -mildly elevated troponin  In setting of hypotension, clinically no evidence of ACS -ECHO with EF improved to 55% now -stopped coreg, volume status managed with HD  -Paroxysmal Atrial flutter -NSR now, contine amiodarone.  -not on anticoagulation due to history of GI bleed.    ESRD on dialysis (HCC) -on HD MWF -caution with excess fluid removal on HD based on resp symptoms resulting in hypotension when we think most of his dyspnea is likely related to restrictive lung disease  Anterior mediastinal density- further work up as outpatient.  Thrombocytopenia- resolved  Consultations:  Renal, PCCM  Discharge Exam: Vitals:   02/25/16 1236 02/25/16 1316  BP: (!) 93/56 93/64  Pulse: 60 65  Resp: 16 17  Temp: 97.6 F (36.4 C) 97.6 F (36.4 C)  General: AAOx3 Cardiovascular: S1S2/RRR Respiratory: CTAB  Discharge Instructions   Discharge Instructions    Discharge instructions    Complete by:  As directed    Renal Diet   Increase activity slowly    Complete by:  As directed       Current Discharge Medication List    START taking these medications   Details  hydrocortisone (CORTEF) 5 MG tablet Take 1-2 tablets (5-10 mg total) by mouth 2 (two) times daily. Take 10mg  in am and 5mg  in pm Qty: 40 tablet, Refills: 0      CONTINUE these medications which have CHANGED   Details  midodrine (PROAMATINE) 5 MG tablet Take 2 tablets (10 mg total) by mouth 3 (three) times daily with meals. For HD Qty: 60 tablet, Refills: 0      CONTINUE these medications which have NOT CHANGED   Details  allopurinol (ZYLOPRIM) 100 MG tablet Take 1 tablet (100 mg total) by mouth 2 (two) times daily. Qty: 180 tablet, Refills: 3    amiodarone (PACERONE) 200 MG tablet TAKE 1 TABLET BY MOUTH EVERY DAY Qty: 30 tablet, Refills: 2    aspirin EC 81 MG tablet Take 81 mg by mouth daily.    atorvastatin (LIPITOR) 40 MG tablet Take 1 tablet (40 mg total) by mouth at bedtime. Qty: 90 tablet, Refills: 3    saccharomyces boulardii (FLORASTOR) 250 MG capsule Take 1 capsule (250 mg total) by mouth 2 (two) times daily.    sevelamer carbonate (RENVELA) 800 MG tablet Take 800 mg by mouth 3 (three) times daily with meals.    calcium carbonate, dosed in mg elemental calcium, 1250 (500 CA) MG/5ML Take 15 mLs (1,500 mg of elemental calcium total) by mouth every 6 (six) hours as needed for indigestion. Qty: 450 mL      STOP taking these medications     carvedilol (COREG) 3.125 MG tablet        No Known Allergies Follow-up Information    PATEL,JAY K., MD. Schedule an appointment as soon as possible for a visit in 1 week(s).   Specialty:  Nephrology Contact information: [address] [place] [address] [phone]        Carlus Pavlov, MD Follow up in 2 week(s).   Specialty:  Internal Medicine Why:  Office will call you with Follow up Need to be weaned off Hydrocortisone and then Stim test once off Contact information: [address] [place]  [address] [phone]            The results of significant diagnostics from this hospitalization (including imaging, microbiology, ancillary and laboratory) are listed below for reference.    Significant Diagnostic Studies: Dg Chest 2 View  Result Date: 02/20/2016 CLINICAL DATA:  Non productive cough. Hypotension and right lower quadrant abdominal discomfort following dialysis. No history of chills or nausea vomiting or diarrhea. EXAM: CHEST  2 VIEW COMPARISON:  Portable chest x-ray of Jun 23, 2014 and PA and lateral chest x-ray of March 06, 2014. FINDINGS: The lungs are hypoinflated. There are coarse lung markings at the left lung base which are more conspicuous than on the most recent lateral radiograph of March 06, 2014. The cardiac silhouette is enlarged. The central pulmonary vascularity is mildly engorged. There is calcification in the wall of the aortic arch. There is no definite pleural effusion. There is prominent thoracic kyphosis with loss of height of multiple lower thoracic vertebral bodies. IMPRESSION: Left basilar atelectasis or pneumonia possibly superimposed upon  pre-existing scarring. Cardiomegaly with mild central pulmonary vascular prominence consistent with low-grade CHF. These findings are accentuated by hypoinflation. Thoracic aortic atherosclerosis. Electronically Signed   By: David  [place] M.D.   On: 02/20/2016 16:41   Ct Chest Wo Contrast  Result Date: 02/20/2016 CLINICAL DATA:  61 year old male with possible pneumonia. EXAM: CT CHEST WITHOUT CONTRAST TECHNIQUE: Multidetector CT imaging of the chest was performed following the standard protocol without IV contrast. COMPARISON:  Chest radiograph dated 02/20/2016 FINDINGS: Evaluation of this exam is limited in the absence of intravenous contrast. Cardiovascular: There is mild cardiomegaly. No pericardial effusion. There is advanced multi vessel coronary vascular calcification involving the LAD, RCA, and left  circumflex artery. There is mild atherosclerotic calcification of the thoracic aorta. No aneurysmal dilatation. There is mild prominence of the main pulmonary trunk suggestive of a degree of pulmonary hypertension. Mediastinum/Nodes: There is no hilar adenopathy. Two well-circumscribed adjacent ovoid soft tissue densities in the anterior mediastinum measuring up to 2.3 cm individually and a combined diameter of approximately 2.7 x 3.0 cm noted. These are indeterminate but may represent mildly enlarged lymph nodes, ectopic thyroid tissue, or other anterior mediastinal mass. These lesions were seen on the CT of 03/30/2009 and demonstrate minimal interval increase in size suggestive of a benign etiology. Clinical correlation is recommended. Lungs/Pleura: There are bibasilar predominantly linear and streaky densities, likely combination of atelectasis and scarring. Infiltrate is less likely but not excluded. Clinical correlation is recommended. There is no pleural effusion or pneumothorax. The central airways are patent. Upper Abdomen: There is stone within the neck of the gallbladder. No pericholecystic fluid. Right renal atrophy. The upper abdomen is otherwise unremarkable. Musculoskeletal: Osteopenia with degenerative changes of the spine. No acute fracture. IMPRESSION: Bibasilar linear and streaky densities most likely atelectasis/scarring. Infiltrate is less likely but not excluded. Clinical correlation is recommended. Well-circumscribed bilobed soft tissue density in the anterior mediastinum with minimal interval change in size compared to the CT of 2011 likely of benign etiology. Mild cardiomegaly.  Multivessel coronary vascular disease. Electronically Signed   By: Elgie Collard M.D.   On: 02/20/2016 21:42   Ct Abdomen Pelvis W Contrast  Result Date: 02/20/2016 CLINICAL DATA:  Mid abdomen pain after dialysis EXAM: CT ABDOMEN AND PELVIS WITH CONTRAST TECHNIQUE: Multidetector CT imaging of the abdomen and  pelvis was performed using the standard protocol following bolus administration of intravenous contrast. CONTRAST:  ISOVUE-300 IOPAMIDOL (ISOVUE-300) INJECTION 61% COMPARISON:  CT abdomen and pelvis of 08/09/2011 FINDINGS: Lower chest: There is linear atelectasis at both lung bases right greater than left. No pleural effusion is seen. There is cardiomegaly present with calcification noted of the mitral and aortic valves. No pericardial effusion is seen. Coronary artery calcifications are noted diffusely. Hepatobiliary: The liver is unremarkable although linear artifacts are created by the patient's arms overlying the patient's sides limiting detail. No calcified gallstones are seen. Pancreas: The pancreas is normal in size and the pancreatic duct is not dilated. Spleen: The spleen is unremarkable. Adrenals/Urinary Tract: The adrenal glands appear normal. The kidneys are very atrophic with no evidence of hydronephrosis. Considerable renal artery calcifications are noted bilaterally. The ureters are normal in caliber. Transplanted kidney in the right pelvis is unremarkable with no hydronephrosis noted. A low-attenuation structure medially and anteriorly within the transplanted kidney is unchanged compared to the prior CT. On delayed images no contrast is seen to be excreted by the transplanted right pelvic kidney. Urinary bladder is decompressed and cannot be well evaluated. Stomach/Bowel: The  stomach is not well distended but no abnormality is evident. No small bowel distention is seen. There are scattered rectosigmoid colon diverticula present. No diverticulitis is noted. The colon is largely decompressed. Vascular/Lymphatic: The abdominal aorta is normal in caliber with advanced abdominal aortic atherosclerotic change present. No adenopathy is seen. Reproductive: The prostate is normal in size. Other: A small umbilical hernia is present containing only fat. Musculoskeletal: The lumbar vertebrae are in normal  alignment with degenerative change in the lower thoracic and upper lumbar spine. Left total hip replacement is present. IMPRESSION: 1. No explanation for the patient's mid abdominal pain is seen. 2. No contrast is seen to be excreted by the transplanted right pelvic kidney. The native kidneys appear atrophic. No hydronephrosis is seen. 3. Coronary artery calcifications. Significant abdominal aortic atherosclerotic change. Electronically Signed   By: Dwyane Dee M.D.   On: 02/20/2016 17:09   Dg Chest Port 1 View  Result Date: 02/23/2016 CLINICAL DATA:  Short of breath. EXAM: PORTABLE CHEST 1 VIEW COMPARISON:  02/20/2016 FINDINGS: Lung volumes are low. There is opacity at both lung bases similar to the prior exam, consistent with atelectasis. No convincing pneumonia. No pulmonary edema. Heart is enlarged but stable.  No gross mediastinal or hilar masses. No convincing pleural effusion.  No pneumothorax. Skeletal structures are demineralized but grossly intact. IMPRESSION: 1. Low lung volumes similar to the prior exam. Lung base opacity is noted bilaterally also stable consistent with atelectasis. No convincing pneumonia or pulmonary edema. Stable cardiomegaly. Electronically Signed   By: Amie Portland M.D.   On: 02/23/2016 11:45    Microbiology: Recent Results (from the past 240 hour(s))  Culture, blood (Routine X 2) w Reflex to ID Panel     Status: None   Collection Time: 02/20/16  6:10 PM  Result Value Ref Range Status   Specimen Description BLOOD RIGHT ANTECUBITAL  Final   Special Requests BOTTLES DRAWN AEROBIC AND ANAEROBIC 5CC  Final   Culture NO GROWTH 5 DAYS  Final   Report Status 02/25/2016 FINAL  Final  Culture, blood (Routine X 2) w Reflex to ID Panel     Status: None   Collection Time: 02/20/16  6:54 PM  Result Value Ref Range Status   Specimen Description BLOOD RIGHT HAND  Final   Special Requests BOTTLES DRAWN AEROBIC AND ANAEROBIC 5CC  Final   Culture NO GROWTH 5 DAYS  Final    Report Status 02/25/2016 FINAL  Final  MRSA PCR Screening     Status: Abnormal   Collection Time: 02/21/16  6:20 AM  Result Value Ref Range Status   MRSA by PCR POSITIVE (A) NEGATIVE Final    Comment:        The GeneXpert MRSA Assay (FDA approved for NASAL specimens only), is one component of a comprehensive MRSA colonization surveillance program. It is not intended to diagnose MRSA infection nor to guide or monitor treatment for MRSA infections. RESULT CALLED TO, READ BACK BY AND VERIFIED WITH: Y. PINEDA 0850 01.25.18 N. MORRIS      Labs: Basic Metabolic Panel:  Recent Labs Lab 02/20/16 1556 02/21/16 0502 02/22/16 1427 02/24/16 0624 02/25/16 0719  NA 132* 135 131* 130* 131*  K 4.1 4.4 4.7 4.7 4.9  CL 93* 100* 99* 95* 94*  CO2 26 24 19* 24 24  GLUCOSE 73 86 141* 123* 96  BUN 11 12 28* 38* 52*  CREATININE 3.91* 4.57* 6.65* 6.68* 7.75*  CALCIUM 8.5* 8.4* 8.9 8.6* 8.7*  PHOS  --   --  4.9*  --  4.6   Liver Function Tests:  Recent Labs Lab 02/20/16 1556 02/21/16 0502 02/22/16 1427 02/25/16 0719  AST 18 15  --   --   ALT 8* 5*  --   --   ALKPHOS 85 66  --   --   BILITOT 1.4* 0.9  --   --   PROT 7.9 6.7  --   --   ALBUMIN 2.9* 2.2* 2.2* 2.3*   No results for input(s): LIPASE, AMYLASE in the last 168 hours. No results for input(s): AMMONIA in the last 168 hours. CBC:  Recent Labs Lab 02/20/16 1556 02/21/16 0156 02/21/16 0502 02/22/16 1427 02/24/16 0624 02/25/16 0412  WBC 7.0 6.3 7.2 11.9* 12.5* 13.4*  NEUTROABS 4.2 3.8 5.0  --   --   --   HGB 14.6 12.6* 13.3 12.9* 13.2 13.8  HCT 44.6 40.3 42.2 39.9 40.9 42.3  MCV 98.0 99.5 99.3 97.1 96.9 96.4  PLT 128* 112* 107* 132* 160 181   Cardiac Enzymes:  Recent Labs Lab 02/21/16 0156 02/21/16 0746 02/21/16 1354  TROPONINI 0.13* 0.18* 0.15*   BNP: BNP (last 3 results) No results for input(s): BNP in the last 8760 hours.  ProBNP (last 3 results) No results for input(s): PROBNP in the last 8760  hours.  CBG:  Recent Labs Lab 02/23/16 0820 02/24/16 2332  GLUCAP 124* 132*       SignedZannie Cove MD.  [name] 02/25/2016, 4:34 PM

## 2016-02-26 NOTE — Progress Notes (Signed)
Patient called and stated that he didn't receive his prescriptions upon discharge last night 02-25-16. This RN informed the patient that his one script midodrine was called into his pharmacy of choice and the other script hydrocortisone will be at the front desk for the patient to pick up.  Patient stating understanding.  Avelina Laine RN

## 2016-03-07 ENCOUNTER — Inpatient Hospital Stay (HOSPITAL_COMMUNITY)
Admission: EM | Admit: 2016-03-07 | Discharge: 2016-03-10 | DRG: 312 | Disposition: A | Discharge status: Home / Self Care | Payer: Medicare Other | Attending: Internal Medicine | Admitting: Internal Medicine

## 2016-03-07 ENCOUNTER — Emergency Department (HOSPITAL_COMMUNITY): Payer: Medicare Other

## 2016-03-07 ENCOUNTER — Encounter (HOSPITAL_COMMUNITY): Payer: Self-pay | Admitting: Emergency Medicine

## 2016-03-07 DIAGNOSIS — I5042 Chronic combined systolic (congestive) and diastolic (congestive) heart failure: Secondary | ICD-10-CM | POA: Diagnosis not present

## 2016-03-07 DIAGNOSIS — I132 Hypertensive heart and chronic kidney disease with heart failure and with stage 5 chronic kidney disease, or end stage renal disease: Secondary | ICD-10-CM | POA: Diagnosis present

## 2016-03-07 DIAGNOSIS — Z992 Dependence on renal dialysis: Secondary | ICD-10-CM | POA: Diagnosis not present

## 2016-03-07 DIAGNOSIS — Z7982 Long term (current) use of aspirin: Secondary | ICD-10-CM

## 2016-03-07 DIAGNOSIS — M40209 Unspecified kyphosis, site unspecified: Secondary | ICD-10-CM | POA: Diagnosis present

## 2016-03-07 DIAGNOSIS — T380X5A Adverse effect of glucocorticoids and synthetic analogues, initial encounter: Secondary | ICD-10-CM | POA: Diagnosis not present

## 2016-03-07 DIAGNOSIS — N186 End stage renal disease: Secondary | ICD-10-CM | POA: Diagnosis present

## 2016-03-07 DIAGNOSIS — E273 Drug-induced adrenocortical insufficiency: Secondary | ICD-10-CM | POA: Diagnosis present

## 2016-03-07 DIAGNOSIS — I953 Hypotension of hemodialysis: Principal | ICD-10-CM | POA: Diagnosis present

## 2016-03-07 DIAGNOSIS — E2740 Unspecified adrenocortical insufficiency: Secondary | ICD-10-CM | POA: Diagnosis present

## 2016-03-07 DIAGNOSIS — E785 Hyperlipidemia, unspecified: Secondary | ICD-10-CM | POA: Diagnosis present

## 2016-03-07 DIAGNOSIS — D631 Anemia in chronic kidney disease: Secondary | ICD-10-CM | POA: Diagnosis not present

## 2016-03-07 DIAGNOSIS — R0902 Hypoxemia: Secondary | ICD-10-CM | POA: Diagnosis not present

## 2016-03-07 DIAGNOSIS — I272 Pulmonary hypertension, unspecified: Secondary | ICD-10-CM | POA: Diagnosis not present

## 2016-03-07 DIAGNOSIS — Z940 Kidney transplant status: Secondary | ICD-10-CM

## 2016-03-07 DIAGNOSIS — T8612 Kidney transplant failure: Secondary | ICD-10-CM | POA: Diagnosis present

## 2016-03-07 DIAGNOSIS — E669 Obesity, unspecified: Secondary | ICD-10-CM | POA: Diagnosis present

## 2016-03-07 DIAGNOSIS — I480 Paroxysmal atrial fibrillation: Secondary | ICD-10-CM | POA: Diagnosis present

## 2016-03-07 DIAGNOSIS — D696 Thrombocytopenia, unspecified: Secondary | ICD-10-CM | POA: Diagnosis present

## 2016-03-07 DIAGNOSIS — Z6831 Body mass index (BMI) 31.0-31.9, adult: Secondary | ICD-10-CM

## 2016-03-07 DIAGNOSIS — Z7952 Long term (current) use of systemic steroids: Secondary | ICD-10-CM

## 2016-03-07 DIAGNOSIS — Z96642 Presence of left artificial hip joint: Secondary | ICD-10-CM | POA: Diagnosis present

## 2016-03-07 DIAGNOSIS — I420 Dilated cardiomyopathy: Secondary | ICD-10-CM | POA: Diagnosis not present

## 2016-03-07 DIAGNOSIS — I509 Heart failure, unspecified: Secondary | ICD-10-CM

## 2016-03-07 DIAGNOSIS — I4892 Unspecified atrial flutter: Secondary | ICD-10-CM | POA: Diagnosis not present

## 2016-03-07 DIAGNOSIS — I959 Hypotension, unspecified: Secondary | ICD-10-CM | POA: Diagnosis present

## 2016-03-07 DIAGNOSIS — Z86010 Personal history of colonic polyps: Secondary | ICD-10-CM

## 2016-03-07 DIAGNOSIS — B1920 Unspecified viral hepatitis C without hepatic coma: Secondary | ICD-10-CM | POA: Diagnosis present

## 2016-03-07 DIAGNOSIS — M40204 Unspecified kyphosis, thoracic region: Secondary | ICD-10-CM | POA: Diagnosis present

## 2016-03-07 DIAGNOSIS — G4733 Obstructive sleep apnea (adult) (pediatric): Secondary | ICD-10-CM | POA: Diagnosis present

## 2016-03-07 LAB — COMPREHENSIVE METABOLIC PANEL
ALK PHOS: 57 U/L (ref 38–126)
ALK PHOS: 61 U/L (ref 38–126)
ALT: 17 U/L (ref 17–63)
ALT: 16 U/L — AB (ref 17–63)
ANION GAP: 9 (ref 5–15)
AST: 31 U/L (ref 15–41)
AST: 29 U/L (ref 15–41)
Albumin: 2.3 g/dL — ABNORMAL LOW (ref 3.5–5.0)
Albumin: 2.3 g/dL — ABNORMAL LOW (ref 3.5–5.0)
Anion gap: 13 (ref 5–15)
BILIRUBIN TOTAL: 1.3 mg/dL — AB (ref 0.3–1.2)
BUN: 10 mg/dL (ref 6–20)
BUN: 11 mg/dL (ref 6–20)
CALCIUM: 8.1 mg/dL — AB (ref 8.9–10.3)
CALCIUM: 7.6 mg/dL — AB (ref 8.9–10.3)
CO2: 30 mmol/L (ref 22–32)
CO2: 23 mmol/L (ref 22–32)
CREATININE: 3.25 mg/dL — AB (ref 0.61–1.24)
Chloride: 99 mmol/L — ABNORMAL LOW (ref 101–111)
Chloride: 101 mmol/L (ref 101–111)
Creatinine, Ser: 3.23 mg/dL — ABNORMAL HIGH (ref 0.61–1.24)
GFR calc non Af Amer: 19 mL/min — ABNORMAL LOW (ref >60)
GFR calc non Af Amer: 19 mL/min — ABNORMAL LOW (ref >60)
GFR, EST AFRICAN AMERICAN: 23 mL/min — AB (ref >60)
GFR, EST AFRICAN AMERICAN: 22 mL/min — AB (ref >60)
GLUCOSE: 78 mg/dL (ref 65–99)
Glucose, Bld: 80 mg/dL (ref 65–99)
POTASSIUM: 3.9 mmol/L (ref 3.5–5.1)
Potassium: 3.9 mmol/L (ref 3.5–5.1)
SODIUM: 138 mmol/L (ref 135–145)
SODIUM: 137 mmol/L (ref 135–145)
TOTAL PROTEIN: 6.4 g/dL — AB (ref 6.5–8.1)
Total Bilirubin: 1.7 mg/dL — ABNORMAL HIGH (ref 0.3–1.2)
Total Protein: 6.5 g/dL (ref 6.5–8.1)

## 2016-03-07 LAB — CBC
HEMATOCRIT: 40.5 % (ref 39.0–52.0)
HEMOGLOBIN: 12.7 g/dL — AB (ref 13.0–17.0)
MCH: 31.8 pg (ref 26.0–34.0)
MCHC: 31.4 g/dL (ref 30.0–36.0)
MCV: 101.3 fL — ABNORMAL HIGH (ref 78.0–100.0)
Platelets: 108 10*3/uL — ABNORMAL LOW (ref 150–400)
RBC: 4 MIL/uL — AB (ref 4.22–5.81)
RDW: 17.7 % — ABNORMAL HIGH (ref 11.5–15.5)
WBC: 8.2 10*3/uL (ref 4.0–10.5)

## 2016-03-07 LAB — CBC WITH DIFFERENTIAL/PLATELET
BASOS ABS: 0 10*3/uL (ref 0.0–0.1)
Basophils Relative: 0 %
EOS ABS: 0.2 10*3/uL (ref 0.0–0.7)
Eosinophils Relative: 2 %
HCT: 40.1 % (ref 39.0–52.0)
HEMOGLOBIN: 12.6 g/dL — AB (ref 13.0–17.0)
LYMPHS PCT: 22 %
Lymphs Abs: 2 10*3/uL (ref 0.7–4.0)
MCH: 31.5 pg (ref 26.0–34.0)
MCHC: 31.4 g/dL (ref 30.0–36.0)
MCV: 100.3 fL — ABNORMAL HIGH (ref 78.0–100.0)
Monocytes Absolute: 1.9 10*3/uL — ABNORMAL HIGH (ref 0.1–1.0)
Monocytes Relative: 21 %
NEUTROS PCT: 55 %
Neutro Abs: 5.1 10*3/uL (ref 1.7–7.7)
Platelets: 118 10*3/uL — ABNORMAL LOW (ref 150–400)
RBC: 4 MIL/uL — ABNORMAL LOW (ref 4.22–5.81)
RDW: 17.2 % — ABNORMAL HIGH (ref 11.5–15.5)
WBC: 9.2 10*3/uL (ref 4.0–10.5)

## 2016-03-07 LAB — MAGNESIUM: Magnesium: 1.6 mg/dL — ABNORMAL LOW (ref 1.7–2.4)

## 2016-03-07 LAB — GLUCOSE, CAPILLARY: Glucose-Capillary: 111 mg/dL — ABNORMAL HIGH (ref 65–99)

## 2016-03-07 LAB — TROPONIN I: TROPONIN I: 0.31 ng/mL — AB (ref <0.03)

## 2016-03-07 LAB — PROCALCITONIN: PROCALCITONIN: 0.61 ng/mL

## 2016-03-07 LAB — BRAIN NATRIURETIC PEPTIDE: B NATRIURETIC PEPTIDE 5: 1588.8 pg/mL — AB (ref 0.0–100.0)

## 2016-03-07 LAB — PROTIME-INR
INR: 1.16
Prothrombin Time: 14.8 seconds (ref 11.4–15.2)

## 2016-03-07 LAB — I-STAT CG4 LACTIC ACID, ED
Lactic Acid, Venous: 1.92 mmol/L (ref 0.5–1.9)
Lactic Acid, Venous: 1.89 mmol/L (ref 0.5–1.9)

## 2016-03-07 LAB — PHOSPHORUS: PHOSPHORUS: 1.9 mg/dL — AB (ref 2.5–4.6)

## 2016-03-07 LAB — INFLUENZA PANEL BY PCR (TYPE A & B)
INFLAPCR: NEGATIVE
INFLBPCR: NEGATIVE

## 2016-03-07 LAB — APTT: aPTT: 30 seconds (ref 24–36)

## 2016-03-07 MED ORDER — HYDROCORTISONE 10 MG PO TABS
10.0000 mg | ORAL_TABLET | Freq: Every day | ORAL | Status: DC
  Administered 2016-03-08 – 2016-03-10 (×3): 10 mg via ORAL
  Filled 2016-03-07: qty 2
  Filled 2016-03-07 (×2): qty 1

## 2016-03-07 MED ORDER — SODIUM CHLORIDE 0.9 % IV SOLN
250.0000 mL | INTRAVENOUS | Status: DC | PRN
  Administered 2016-03-09: 250 mL via INTRAVENOUS

## 2016-03-07 MED ORDER — AMIODARONE HCL 200 MG PO TABS
200.0000 mg | ORAL_TABLET | Freq: Every day | ORAL | Status: DC
  Administered 2016-03-08 – 2016-03-10 (×3): 200 mg via ORAL
  Filled 2016-03-07 (×3): qty 1

## 2016-03-07 MED ORDER — HYDROCORTISONE 5 MG PO TABS
5.0000 mg | ORAL_TABLET | Freq: Every day | ORAL | Status: DC
  Administered 2016-03-08 – 2016-03-09 (×3): 5 mg via ORAL
  Filled 2016-03-07 (×4): qty 1

## 2016-03-07 MED ORDER — PIPERACILLIN-TAZOBACTAM IN DEX 2-0.25 GM/50ML IV SOLN: 2.2500 g | Freq: Once | INTRAVENOUS | Status: DC

## 2016-03-07 MED ORDER — PIPERACILLIN-TAZOBACTAM 3.375 G IVPB 30 MIN
3.3750 g | Freq: Once | INTRAVENOUS | Status: AC
  Administered 2016-03-07: 3.375 g via INTRAVENOUS
  Filled 2016-03-07: qty 50

## 2016-03-07 MED ORDER — SEVELAMER CARBONATE 800 MG PO TABS
800.0000 mg | ORAL_TABLET | Freq: Three times a day (TID) | ORAL | Status: DC
  Administered 2016-03-08 – 2016-03-09 (×6): 800 mg via ORAL
  Filled 2016-03-07 (×7): qty 1

## 2016-03-07 MED ORDER — SODIUM CHLORIDE 0.9 % IV BOLUS (SEPSIS)
500.0000 mL | Freq: Once | INTRAVENOUS | Status: AC
  Administered 2016-03-07: 500 mL via INTRAVENOUS

## 2016-03-07 MED ORDER — VANCOMYCIN HCL 10 G IV SOLR
2000.0000 mg | Freq: Once | INTRAVENOUS | Status: AC
  Administered 2016-03-07: 2000 mg via INTRAVENOUS
  Filled 2016-03-07: qty 2000

## 2016-03-07 MED ORDER — CALCIUM CARBONATE ANTACID 1250 MG/5ML PO SUSP
1500.0000 mg | Freq: Four times a day (QID) | ORAL | Status: DC | PRN
  Filled 2016-03-07: qty 15

## 2016-03-07 MED ORDER — DEXAMETHASONE SODIUM PHOSPHATE 10 MG/ML IJ SOLN
20.0000 mg | Freq: Once | INTRAMUSCULAR | Status: AC
  Administered 2016-03-07: 20 mg via INTRAVENOUS
  Filled 2016-03-07: qty 2

## 2016-03-07 MED ORDER — HEPARIN SODIUM (PORCINE) 5000 UNIT/ML IJ SOLN
5000.0000 [IU] | Freq: Three times a day (TID) | INTRAMUSCULAR | Status: DC
  Administered 2016-03-07 – 2016-03-10 (×5): 5000 [IU] via SUBCUTANEOUS
  Filled 2016-03-07 (×8): qty 1

## 2016-03-07 MED ORDER — ASPIRIN 81 MG PO CHEW
324.0000 mg | CHEWABLE_TABLET | ORAL | Status: AC
  Administered 2016-03-07: 324 mg via ORAL
  Filled 2016-03-07: qty 4

## 2016-03-07 MED ORDER — PANTOPRAZOLE SODIUM 40 MG PO TBEC
40.0000 mg | DELAYED_RELEASE_TABLET | Freq: Every day | ORAL | Status: DC
  Administered 2016-03-08 – 2016-03-10 (×3): 40 mg via ORAL
  Filled 2016-03-07 (×3): qty 1

## 2016-03-07 MED ORDER — SODIUM CHLORIDE 0.9 % IV BOLUS (SEPSIS)
1000.0000 mL | Freq: Once | INTRAVENOUS | Status: AC
  Administered 2016-03-07: 1000 mL via INTRAVENOUS

## 2016-03-07 MED ORDER — ASPIRIN 300 MG RE SUPP: 300.0000 mg | RECTAL | Status: AC

## 2016-03-07 MED ORDER — ALLOPURINOL 100 MG PO TABS
100.0000 mg | ORAL_TABLET | Freq: Every day | ORAL | Status: DC
  Administered 2016-03-07 – 2016-03-09 (×3): 100 mg via ORAL
  Filled 2016-03-07 (×4): qty 1

## 2016-03-07 MED ORDER — MIDODRINE HCL 5 MG PO TABS
10.0000 mg | ORAL_TABLET | Freq: Three times a day (TID) | ORAL | Status: DC
  Administered 2016-03-08 – 2016-03-10 (×7): 10 mg via ORAL
  Filled 2016-03-07 (×8): qty 2

## 2016-03-07 NOTE — ED Notes (Addendum)
EDP notified of BP of 64/46

## 2016-03-07 NOTE — ED Triage Notes (Signed)
Per EMS, Pt from dialysis, pt completed dialysis today. Staff at dialysis center report chills and shaking, fever (100.0), hypotension (94/64). Pt reports chills/shaking has been occurring for 2-3 weeks. Pt received 1000 mg Tylenol at 1545 en route. Graft still accessed. BP 94/64, HR 100, RR 18, O2 99% on Ra,

## 2016-03-07 NOTE — ED Provider Notes (Signed)
[facility] DEPT Provider Note   CSN: [id number] Arrival date & time: 03/07/16  1630     History   Chief Complaint Chief Complaint  Patient presents with  . Hypotension  . Fever    HPI Chad [name] is a 61 y.o. male.  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 Pt recently seen and admitted for hypotension, r/o septic shock and sent home with milrinone and wean off hydrocortisone with fu with endocrine. Pt states he has only been taking milrinone when he goes to dialysis and that he just got his fu with endocrine today so has not followed. He otherwise states he does have some light dysuria for over a month but no cough, sore throat, rash, cp, new sob. He has been feeling slightly sob since recent admission but this is unchanged and was thought to be related to restrictive lung dz. Pt was hypotensive at dialysis, had full fluid removed however. BP at baseline in systolic of 03J.  Past Medical History:  Diagnosis Date  . Atrial flutter ([place])    takes Amiodarone daily  . CKD (chronic kidney disease), stage IV ([place])    a. s/p renal transplant 1996.  Marland Kitchen GERD (gastroesophageal reflux disease)    takes Protonix daily  . Gout    takes Allopurinol daily  . Hepatitis C 1996   Hep C  . History of bronchitis    several yrs ago  . History of colon polyps   . History of echocardiogram    Echo 12/17: Mild LVH, EF 55-60, inferolateral and inferior akinesis, grade 1 diastolic dysfunction, moderate aortic stenosis (mean 20, peak 32), mild AI, mildly dilated ascending aorta, MAC, mild LAE, mildly increased PASP  . History of kidney stones   . History of shingles   . HLD (hyperlipidemia)    takes Pravastatin daily  . Hyperparathyroidism due to renal insufficiency ([place])   . Hypertension    takes  Amlodipine,Hydralazine,Imdur,and Coreg daily  . Joint pain    right shoulder and left knee  . Nonischemic cardiomyopathy ([place])    a. unknown etiology, EF 30-35% by echo 09/2011;  b. 09/2011 Lexi MV EF 42%, no ischemia/infarct.  . Pneumonia 2014  . Sciatica   . Shortness of breath dyspnea    exertion  . Sleep apnea    does not use cpap  . Systolic CHF ([place])    takes Lasix daily  . Urinary frequency   . Wound of left leg     Patient Active Problem List   Diagnosis Date Noted  . Hypotension 02/21/2016  . Aortic valvular stenosis 11/27/2015  . Laceration of left leg 06/23/2014  . Chronic multifocal osteomyelitis of left femur ([place]) 06/22/2014  . Acute blood loss anemia 06/12/2014  . ESRD on dialysis ([place]) 06/11/2014  . 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 Gout 03/22/2014  . Hepatitis C 03/22/2014  . HLD (hyperlipidemia)   . Hyperparathyroidism due to renal insufficiency ([place])   . Osteomyelitis ([place]) 03/14/2014  . Broken femur ([place])   . Skin lesion of right leg 06/27/2013  . Postoperative wound infection 08/25/2012  . Femur fracture, left ([place]) 07/16/2012  . Diarrhea 03/16/2012  . Hx of amiodarone therapy 03/13/2012  . Long term current use of systemic steroids 03/13/2012  . Pulmonary hypertension 03/13/2012  .  CHF (congestive heart failure) ([place]) 12/08/2011  . Community acquired pneumonia 12/08/2011  . PAF (paroxysmal atrial fibrillation) ([place]) 12/08/2011  . History of renal transplant 12/08/2011  . HTN (hypertension) 12/08/2011  . Respiratory failure with hypoxia ([place]) 12/08/2011  . CKD (chronic kidney disease) 11/18/2011  . OSA (obstructive sleep apnea) 11/13/2011  . Atrial flutter ([place]) 08/27/2011  . Ulcer of left lower leg ([place]) 08/27/2011  . Conjunctivitis, acute, left eye 08/27/2011  . Dilated cardiomyopathy ([place]) 06/10/2011  . Renal transplant disorder 06/05/2011    Past Surgical History:  Procedure Laterality Date    . AV FISTULA PLACEMENT Left 06/01/2014   Procedure: LEFT BRACHIOCEPHALIC ARTERIOVENOUS (AV) FISTULA CREATION;  Surgeon: Mal Misty, MD;  Location: [facility];  Service: Vascular;  Laterality: Left;  . colonsocopy    . FEMUR FRACTURE SURGERY Left 07/09/2012   Dr Sherrian Divers  . FEMUR IM NAIL Left 07/10/2012   ORIF L distal femur;  Surgeon: Wylene Simmer, MD;  Location: [facility];  Service: Orthopedics;  Laterality: Left;  . HARDWARE REMOVAL Left 03/14/2014   Procedure: HARDWARE REMOVAL LEFT;  Surgeon: Altamese Del Aire, MD;  Location: [facility];  Service: Orthopedics;  Laterality: Left;  . HIP SURGERY Left    replacement  . I&D EXTREMITY Left 08/24/2012   Procedure: LEFT LEG IRRIGATION AND DEBRIDEMENT WITH POSSIBLE WOUND VAC APPLICATION;  Surgeon: Wylene Simmer, MD;  Location: [facility];  Service: Orthopedics;  Laterality: Left;  . I&D EXTREMITY Left 06/23/2014   Procedure: IRRIGATION AND DEBRIDEMENT EXTREMITY;  Surgeon: Altamese Luxora, MD;  Location: [facility];  Service: Orthopedics;  Laterality: Left;  . INCISION AND DRAINAGE Left 06/08/2014   Procedure: INCISION AND DRAINAGE OF LEFT THIGH;  Surgeon: Altamese Calera, MD;  Location: [facility];  Service: Orthopedics;  Laterality: Left;  . INSERTION OF DIALYSIS CATHETER  06/20/2011   Procedure: INSERTION OF DIALYSIS CATHETER;  Surgeon: Serafina Mitchell, MD;  Location: [facility];  Service: Vascular;  Laterality: Right;  Ultrasound guided insertion of right internal jugular dialysis catheter  . JOINT REPLACEMENT     left hip  . MULTIPLE EXTRACTIONS WITH ALVEOLOPLASTY  06/27/2011   Procedure: MULTIPLE EXTRACION WITH ALVEOLOPLASTY;  Surgeon: Lenn Cal, DDS;  Location: [facility];  Service: Oral Surgery;  Laterality: N/A;  Extraction  of tooth # 14 with alveoloplasty  . NEPHRECTOMY TRANSPLANTED ORGAN Right 4yr ago  . NON UNION Left 03/14/2014   LEFT FEMER REPAIR     BY DR HANDY  . TRACHEOSTOMY  2014  . TRACHEOSTOMY CLOSURE  2014   2 wks after it was done  . WOUND DEBRIDEMENT Left 03/14/2014    Procedure: DEBRIDEMENT WOUND;  Surgeon: MAltamese Lula MD;  Location: [facility]  Service: Orthopedics;  Laterality: Left;  INSERTION OF  ANTIBIOTIC Beads  (2 strands: 15 beads and 14 beads)       Home Medications    Prior to Admission medications   Medication Sig Start Date End Date Taking? Authorizing Provider  allopurinol (ZYLOPRIM) 100 MG tablet Take 1 tablet (100 mg total) by mouth 2 (two) times daily. Patient taking differently: Take 100 mg by mouth at bedtime.  07/16/15  Yes DJolaine Artist MD  amiodarone (PACERONE) 200 MG tablet TAKE 1 TABLET BY MOUTH EVERY DAY Patient taking differently: TAKE 1 TABLET BY MOUTH EVERY DAY AT BEDTIME 02/15/16  Yes DLarey Dresser MD  aspirin EC 81 MG tablet Take 81 mg by mouth at bedtime.    Yes Historical Provider, MD  atorvastatin (LIPITOR)  40 MG tablet Take 1 tablet (40 mg total) by mouth at bedtime. 10/25/15  Yes Shaune Pascal Bensimhon, MD  calcium carbonate, dosed in mg elemental calcium, 1250 (500 CA) MG/5ML Take 15 mLs (1,500 mg of elemental calcium total) by mouth every 6 (six) hours as needed for indigestion. 06/19/14  Yes Ainsley Spinner, PA-C  midodrine (PROAMATINE) 5 MG tablet Take 2 tablets (10 mg total) by mouth 3 (three) times daily with meals. For HD Patient taking differently: Take 10 mg by mouth See admin instructions. Take 2 tablets (10 mg) by mouth on Monday, Wednesday, Friday before dialysis 02/25/16  Yes Domenic Polite, MD  Probiotic Product (PROBIOTIC PO) Take 1 tablet by mouth at bedtime.   Yes Historical Provider, MD  sevelamer carbonate (RENVELA) 800 MG tablet Take 800 mg by mouth 3 (three) times daily with meals.   Yes Historical Provider, MD  hydrocortisone (CORTEF) 5 MG tablet Take 1-2 tablets (5-10 mg total) by mouth 2 (two) times daily. Take 6m in am and 562min pm Patient not taking: Reported on 03/07/2016 02/25/16   PrDomenic PoliteMD  saccharomyces boulardii (FLORASTOR) 250 MG capsule Take 1 capsule (250 mg total) by mouth 2 (two)  times daily. Patient not taking: Reported on 03/07/2016 06/19/14   KeAinsley SpinnerPA-C    Family History Family History  Problem Relation Age of Onset  . Heart disease Neg Hx     Social History Social History  Substance Use Topics  . Smoking status: Never Smoker  . Smokeless tobacco: Never Used  . Alcohol use No     Allergies   Patient has no known allergies.   Review of Systems Review of Systems  Constitutional: Positive for chills.  Gastrointestinal: Negative for diarrhea.  Genitourinary: Positive for dysuria.  All other systems reviewed and are negative.    Physical Exam Updated Vital Signs BP (!) 67/49 (BP Location: Right Arm)   Pulse 92   Temp 99.4 F (37.4 C) (Oral)   Resp 21   SpO2 94%   Physical Exam  Constitutional: He appears well-developed and well-nourished. No distress.  HENT:  Head: Normocephalic and atraumatic.  Left Ear: External ear normal.  Eyes: Conjunctivae are normal. Pupils are equal, round, and reactive to light. Right eye exhibits no discharge. Left eye exhibits no discharge.  Neck: Normal range of motion. Neck supple.  Cardiovascular: Normal rate and regular rhythm.   No murmur heard. Pulmonary/Chest: Effort normal and breath sounds normal. No respiratory distress.  Abdominal: Soft. Bowel sounds are normal. He exhibits no distension and no mass. There is no tenderness. There is no rebound and no guarding.  Musculoskeletal: He exhibits no edema.  Neurological: He is alert.  Skin: Skin is warm. Capillary refill takes less than 2 seconds. He is not diaphoretic.  Psychiatric: He has a normal mood and affect.   ED Treatments / Results  Labs (all labs ordered are listed, but only abnormal results are displayed) Labs Reviewed  COMPREHENSIVE METABOLIC PANEL - Abnormal; Notable for the following:       Result Value   Chloride 99 (*)    Creatinine, Ser 3.23 (*)    Calcium 8.1 (*)    Total Protein 6.4 (*)    Albumin 2.3 (*)    Total  Bilirubin 1.3 (*)    GFR calc non Af Amer 19 (*)    GFR calc Af Amer 23 (*)    All other components within normal limits  CBC WITH DIFFERENTIAL/PLATELET - Abnormal; Notable  for the following:    RBC 4.00 (*)    Hemoglobin 12.6 (*)    MCV 100.3 (*)    RDW 17.2 (*)    Platelets 118 (*)    Monocytes Absolute 1.9 (*)    All other components within normal limits  BRAIN NATRIURETIC PEPTIDE - Abnormal; Notable for the following:    B Natriuretic Peptide 1,588.8 (*)    All other components within normal limits  TROPONIN I - Abnormal; Notable for the following:    Troponin I 0.31 (*)    All other components within normal limits  CBC - Abnormal; Notable for the following:    RBC 4.00 (*)    Hemoglobin 12.7 (*)    MCV 101.3 (*)    RDW 17.7 (*)    Platelets 108 (*)    All other components within normal limits  I-STAT CG4 LACTIC ACID, ED - Abnormal; Notable for the following:    Lactic Acid, Venous 1.92 (*)    All other components within normal limits  CULTURE, BLOOD (ROUTINE X 2)  CULTURE, BLOOD (ROUTINE X 2)  URINE CULTURE  PROCALCITONIN  INFLUENZA PANEL BY PCR (TYPE A & B)  URINALYSIS, ROUTINE W REFLEX MICROSCOPIC  COMPREHENSIVE METABOLIC PANEL  MAGNESIUM  PHOSPHORUS  PROTIME-INR  APTT  I-STAT CG4 LACTIC ACID, ED  I-STAT CG4 LACTIC ACID, ED    EKG  EKG Interpretation  Date/Time:  Friday March 07 2016 19:20:58 EST Ventricular Rate:  83 PR Interval:    QRS Duration: 140 QT Interval:  481 QTC Calculation: 566 R Axis:   124 Text Interpretation:  Sinus or ectopic atrial rhythm Ventricular premature complex Nonspecific intraventricular conduction delay Repol abnrm suggests ischemia, lateral leads No significant change since last tracing Confirmed by YAO  MD, DAVID ([id number]) on 03/07/2016 7:27:44 PM       Radiology Dg Chest 2 View  Result Date: 03/07/2016 CLINICAL DATA:  Chills and fever EXAM: CHEST  2 VIEW COMPARISON:  02/23/2016 FINDINGS: Cardiac shadow is enlarged. Aortic  calcifications are again noted. The overall inspiratory effort is poor with bibasilar atelectasis. Right-sided pleural effusion is noted. Multilevel compression deformities are noted stable from the prior exam. IMPRESSION: Bibasilar atelectasis and right-sided pleural effusion. Electronically Signed   By: Inez Catalina M.D.   On: 03/07/2016 20:07    Procedures Procedures (including critical care time)  Medications Ordered in ED Medications  0.9 %  sodium chloride infusion (not administered)  aspirin chewable tablet 324 mg (not administered)    Or  aspirin suppository 300 mg (not administered)  heparin injection 5,000 Units (not administered)  midodrine (PROAMATINE) tablet 10 mg (not administered)  sevelamer carbonate (RENVELA) tablet 800 mg (not administered)  calcium carbonate (dosed in mg elemental calcium) suspension 1,500 mg of elemental calcium (not administered)  allopurinol (ZYLOPRIM) tablet 100 mg (not administered)  amiodarone (PACERONE) tablet 200 mg (not administered)  pantoprazole (PROTONIX) EC tablet 40 mg (not administered)  sodium chloride 0.9 % bolus 1,000 mL (0 mLs Intravenous Stopped 03/07/16 1910)  vancomycin (VANCOCIN) 2,000 mg in sodium chloride 0.9 % 500 mL IVPB (0 mg Intravenous Stopped 03/07/16 2208)  sodium chloride 0.9 % bolus 1,000 mL (0 mLs Intravenous Stopped 03/07/16 2032)  piperacillin-tazobactam (ZOSYN) IVPB 3.375 g (0 g Intravenous Stopped 03/07/16 2008)  dexamethasone (DECADRON) injection 20 mg (20 mg Intravenous Given 03/07/16 2019)  sodium chloride 0.9 % bolus 500 mL (0 mLs Intravenous Stopped 03/07/16 2249)  sodium chloride 0.9 % bolus 500 mL (0 mLs Intravenous  Stopped 03/07/16 2249)     Initial Impression / Assessment and Plan / ED Course  I have reviewed the triage vital signs and the nursing notes.  Pertinent labs & imaging results that were available during my care of the patient were reviewed by me and considered in my medical decision making (see chart for  details).    Hypotension from unclear source. Did have borderline temperature but no infectious etiology evident as UA, CXR negative. Bcx drawn, pt does have a line that was accessed and does not appear infected. S/p broad spectrum abx given no clear source. LA elevated but not significantly different from baseline, dicussed with ICU team and given that pt appears so well, will hold from starting pressors despite BP consistently in 86-76H systolic. Flu negative. Abdominal pain is chronic with negative CT A/P wo AAA last month. No diarrhea to suggest C diff or other GI etiology. EKG not suggestive of ACS, trop slightly higher than baseline but baseline demand ischemia. 3L fluids given total, bp still very low but stable appearing. Will admit. Pt updated and in agreement with plan.   Final Clinical Impressions(s) / ED Diagnoses   Final diagnoses:  Hypotension, unspecified hypotension type    New Prescriptions New Prescriptions   No medications on file       Karma Greaser, MD 03/07/16 2313    Drenda Freeze, MD 03/08/16 [phone]

## 2016-03-07 NOTE — H&P (Signed)
PULMONARY / CRITICAL CARE MEDICINE   Name: Chad [name] MRN: [id number] DOB: 07/31/1955    ADMISSION DATE:  03/07/2016 CONSULTATION DATE:  03/07/16   REFERRING MD:  Gordy Councilman Ruch  CHIEF COMPLAINT:  Hypotension   HISTORY OF PRESENT ILLNESS:   Chad [name] with pmhx of a-flutter ESRD, hypertension, systolic CHF, hepatitis C, adrenal insufficiency presenting with hypotension and chills after dialysis. Per dialysis center patient had a fever of 100. Patient indicates that he not been ill lately. He felt since his last dialysis treatment on Wednesday that he was a little bit more tired than usual. Otherwise patient denies any acute symptoms of cough, congestion, fevers or chills. Patient indicates that he has some abdominal discomfort, specifically in his epigastric area. He states that it started after he was given Tylenol while at the dialysis center. He states he this chronically after dialysis. Patient with epigastric pain on 1/25 ruled out for acute abdomen with CT abdomen/pelvis   Patient was recently seen for hypotension, rule out septic shock on 1/25. Patient was sent home midodrine and hydrocortisone for possible adrenal insufficiency.   In the ED patient was hypotensive to 67/49 with normal mentation.  Code sepsis was called and patient was admitted to ICU.    PAST MEDICAL HISTORY :  He  has a past medical history of Atrial flutter ([facility]); CKD (chronic kidney disease), stage IV ([facility]); GERD (gastroesophageal reflux disease); Gout; Hepatitis C (1996); History of bronchitis; History of colon polyps; History of echocardiogram; History of kidney stones; History of shingles; HLD (hyperlipidemia); Hyperparathyroidism due to renal insufficiency ([facility]); Hypertension; Joint pain; Nonischemic cardiomyopathy ([facility]); Pneumonia (2014); Sciatica; Shortness of breath dyspnea; Sleep apnea; Systolic CHF ([facility]); Urinary frequency; and Wound of left leg.  PAST SURGICAL HISTORY: He  has a past  surgical history that includes Nephrectomy transplanted organ (Right, 71yrs ago); Insertion of dialysis catheter (06/20/2011); Multiple extractions with alveoloplasty (06/27/2011); Hip surgery (Left); Joint replacement; Femur fracture surgery (Left, 07/09/2012); Femur IM nail (Left, 07/10/2012); I&D extremity (Left, 08/24/2012); Tracheostomy (2014); Tracheostomy closure (2014); colonsocopy; NON UNION (Left, 03/14/2014); Hardware Removal (Left, 03/14/2014); Wound debridement (Left, 03/14/2014); AV fistula placement (Left, 06/01/2014); Incision and drainage (Left, 06/08/2014); and I&D extremity (Left, 06/23/2014).  No Known Allergies  No current facility-administered medications on file prior to encounter.    Current Outpatient Prescriptions on File Prior to Encounter  Medication Sig  . allopurinol (ZYLOPRIM) 100 MG tablet Take 1 tablet (100 mg total) by mouth 2 (two) times daily. (Patient taking differently: Take 100 mg by mouth at bedtime. )  . amiodarone (PACERONE) 200 MG tablet TAKE 1 TABLET BY MOUTH EVERY DAY (Patient taking differently: TAKE 1 TABLET BY MOUTH EVERY DAY AT BEDTIME)  . aspirin EC 81 MG tablet Take 81 mg by mouth at bedtime.   Marland Kitchen atorvastatin (LIPITOR) 40 MG tablet Take 1 tablet (40 mg total) by mouth at bedtime.  . calcium carbonate, dosed in mg elemental calcium, 1250 (500 CA) MG/5ML Take 15 mLs (1,500 mg of elemental calcium total) by mouth every 6 (six) hours as needed for indigestion.  . midodrine (PROAMATINE) 5 MG tablet Take 2 tablets (10 mg total) by mouth 3 (three) times daily with meals. For HD (Patient taking differently: Take 10 mg by mouth See admin instructions. Take 2 tablets (10 mg) by mouth on Monday, Wednesday, Friday before dialysis)  . sevelamer carbonate (RENVELA) 800 MG tablet Take 800 mg by mouth 3 (three) times daily with meals.  . hydrocortisone (CORTEF) 5  MG tablet Take 1-2 tablets (5-10 mg total) by mouth 2 (two) times daily. Take 10mg  in am and 5mg  in pm (Patient not  taking: Reported on 03/07/2016)  . saccharomyces boulardii (FLORASTOR) 250 MG capsule Take 1 capsule (250 mg total) by mouth 2 (two) times daily. (Patient not taking: Reported on 03/07/2016)    FAMILY HISTORY:  His indicated that his mother is deceased. He indicated that his father is deceased. He indicated that the status of his neg hx is unknown.    SOCIAL HISTORY: He  reports that he has never smoked. He has never used smokeless tobacco. He reports that he does not drink alcohol or use drugs.  REVIEW OF SYSTEMS:   Review of Systems  Constitutional: Positive for chills and malaise/fatigue. Negative for fever.  HENT: Negative for congestion and sore throat.   Eyes: Negative for blurred vision and double vision.  Respiratory: Negative for cough and shortness of breath.   Cardiovascular: Positive for leg swelling. Negative for chest pain.  Gastrointestinal: Positive for abdominal pain. Negative for blood in stool, nausea and vomiting.  Genitourinary: Negative for dysuria and urgency.  Musculoskeletal: Positive for back pain.  Skin: Negative for rash.  Neurological: Negative for dizziness and headaches.  Psychiatric/Behavioral: Negative for depression and suicidal ideas.    VITAL SIGNS: BP (!) 75/49   Pulse 81   Temp 99.4 F (37.4 C) (Oral)   Resp (!) 29   SpO2 98%   HEMODYNAMICS:    VENTILATOR SETTINGS:    INTAKE / OUTPUT: No intake/output data recorded.  PHYSICAL EXAMINATION: General:  Chronically ill-appearing elderly man, no acute distress Neuro:  Movable all his limbs, alert and orientated 3 HEENT:  Moist mucous membranes,  PERRLA Cardiovascular:  RRR, 3 out of 5 systolic murmur Lungs:  Bibasilar crackles noted, no increased work of breathing Abdomen:  BS +, slightly tender epigastric area, slightly distended, scar from transplant surgery Musculoskeletal:  Bilateral edema 3+ to the knee, 3+ along the lateral hip Skin:  Skin changes associated with chronic edema,  tender to palpation  LABS:  BMET  Recent Labs Lab 03/07/16 1819  NA 138  K 3.9  CL 99*  CO2 30  BUN 10  CREATININE 3.23*  GLUCOSE 80    Electrolytes  Recent Labs Lab 03/07/16 1819  CALCIUM 8.1*    CBC  Recent Labs Lab 03/07/16 1819  WBC 9.2  HGB 12.6*  HCT 40.1  PLT 118*    Coag's No results for input(s): APTT, INR in the last 168 hours.  Sepsis Markers  Recent Labs Lab 03/07/16 1819 03/07/16 1829  LATICACIDVEN  --  1.92*  PROCALCITON 0.61  --     ABG No results for input(s): PHART, PCO2ART, PO2ART in the last 168 hours.  Liver Enzymes  Recent Labs Lab 03/07/16 1819  AST 31  ALT 17  ALKPHOS 57  BILITOT 1.3*  ALBUMIN 2.3*    Cardiac Enzymes  Recent Labs Lab 03/07/16 1819  TROPONINI 0.31*    Glucose No results for input(s): GLUCAP in the last 168 hours.  Imaging Dg Chest 2 View  Result Date: 03/07/2016 CLINICAL DATA:  Chills and fever EXAM: CHEST  2 VIEW COMPARISON:  02/23/2016 FINDINGS: Cardiac shadow is enlarged. Aortic calcifications are again noted. The overall inspiratory effort is poor with bibasilar atelectasis. Right-sided pleural effusion is noted. Multilevel compression deformities are noted stable from the prior exam. IMPRESSION: Bibasilar atelectasis and right-sided pleural effusion. Electronically Signed   By: Eulah Pont.D.  On: 03/07/2016 20:07     STUDIES:  CXR 2/9>>  CULTURES: BCX 2/9 >> UCX 2/9 >>  ANTIBIOTICS: Vanc 2/9 >> Zosyn 2/9>>  SIGNIFICANT EVENTS: Admit to ICU 2/9>>  LINES/TUBES: None   DISCUSSION: Chad [name] is 61 y.o. male with pmhx a-flutter ESRD, hypertension, systolic CHF, hepatitis C, adrenal insufficiency presenting for severe hypotension. Previously admitted for similar symptoms on 1/25, not found to be due to sepsis at the time. Thought more likely to be multifactorial/with some component of adrenal insufficiency. As patient is currently afebrile with normal mentation and  without any symptoms acute illness on H&P think that this is likely his chronic hypotension.  ASSESSMENT / PLAN:  PULMONARY  A: CXR concerning for pulmonary edema, received 1 L in the ED, crackles noted on exam Mediastinal  nodule noted on last CT chest Pulmonary hypertension, PA peak pressure 43 mmHg P:   Outpatient follow-up for mediastinal nodule Sats > 88% Repeat CXR in the AM   CARDIOVASCULAR A:  Severe Hypotension, concern for septic shock, likely chronic hypotension + dialysis induced  Systolic CHF, last echo 50 to 81%, G DD 1, functional bicuspid aortic valve, moderate aortic stenosis, moderate mitral valve stenosis BNP elevated  A-flutter, no treatment due to GI bleed in the past P:  Continue amiodarone Maintain MAP> 55 Will need dialysis likely Continue midodrine  Limit fluid resuscitation  Received Decadron in ED  Will start home hydrocortisone tomorrow  Consider adding florinef   RENAL A:   ESRD MWF Electrolytes stable  P:   Consult nephrology in the a.m.  GASTROINTESTINAL A:   Nutrition  Chronic epigastric abdominal pain, Normal CT abdomen/pelvis in 1/25 Hx of GI bleed  P:   Follow abdominal pain for resolution  Protonix  Renal Diet   HEMATOLOGIC A:   Chronic Thrombocytopenia  P:  Coag studies  DVT prophylaxis  CBC in the AM   INFECTIOUS A:   Rule out septic shock, no source found Lactic acid slightly elevated 1.9   P:   Trend lactic acid Trend pro calcitonin  Vancomycin and Zosyn  Cultures pending   ENDOCRINE A:   Adrenal insufficiency P:   Consider obtaining Cortisol level  Continue hydrocortisone tomorrow   NEUROLOGIC A:   Alert and oriented 3 P:   Continue to follow mentation    Pulmonary and Critical Care Medicine [facility] Pager: [phone]  03/07/2016, 9:55 PM

## 2016-03-07 NOTE — ED Notes (Signed)
Pt states he makes very little urine but will attempt to get [place] a sample if he feels like he needs to urinate

## 2016-03-07 NOTE — ED Notes (Signed)
EDP at bedside attempting US IV 

## 2016-03-07 NOTE — ED Notes (Signed)
EDP aware of blood pressure remaining in the 70s after 2 liter boluses.

## 2016-03-08 DIAGNOSIS — I959 Hypotension, unspecified: Secondary | ICD-10-CM

## 2016-03-08 DIAGNOSIS — Z992 Dependence on renal dialysis: Secondary | ICD-10-CM

## 2016-03-08 DIAGNOSIS — R0902 Hypoxemia: Secondary | ICD-10-CM

## 2016-03-08 DIAGNOSIS — I953 Hypotension of hemodialysis: Secondary | ICD-10-CM | POA: Diagnosis not present

## 2016-03-08 DIAGNOSIS — J810 Acute pulmonary edema: Secondary | ICD-10-CM

## 2016-03-08 DIAGNOSIS — N186 End stage renal disease: Secondary | ICD-10-CM | POA: Diagnosis not present

## 2016-03-08 LAB — CBC
HEMATOCRIT: 38.2 % — AB (ref 39.0–52.0)
Hemoglobin: 12.2 g/dL — ABNORMAL LOW (ref 13.0–17.0)
MCH: 31.9 pg (ref 26.0–34.0)
MCHC: 31.9 g/dL (ref 30.0–36.0)
MCV: 100 fL (ref 78.0–100.0)
Platelets: 154 10*3/uL (ref 150–400)
RBC: 3.82 MIL/uL — AB (ref 4.22–5.81)
RDW: 17.3 % — AB (ref 11.5–15.5)
WBC: 13.1 10*3/uL — AB (ref 4.0–10.5)

## 2016-03-08 LAB — RENAL FUNCTION PANEL
Albumin: 2.1 g/dL — ABNORMAL LOW (ref 3.5–5.0)
Anion gap: 12 (ref 5–15)
BUN: 21 mg/dL — ABNORMAL HIGH (ref 6–20)
CHLORIDE: 102 mmol/L (ref 101–111)
CO2: 22 mmol/L (ref 22–32)
Calcium: 8.1 mg/dL — ABNORMAL LOW (ref 8.9–10.3)
Creatinine, Ser: 4.3 mg/dL — ABNORMAL HIGH (ref 0.61–1.24)
GFR, EST AFRICAN AMERICAN: 16 mL/min — AB (ref >60)
GFR, EST NON AFRICAN AMERICAN: 14 mL/min — AB (ref >60)
Glucose, Bld: 181 mg/dL — ABNORMAL HIGH (ref 65–99)
POTASSIUM: 4.2 mmol/L (ref 3.5–5.1)
Phosphorus: 2.1 mg/dL — ABNORMAL LOW (ref 2.5–4.6)
Sodium: 136 mmol/L (ref 135–145)

## 2016-03-08 MED ORDER — VANCOMYCIN HCL IN DEXTROSE 1-5 GM/200ML-% IV SOLN
1000.0000 mg | INTRAVENOUS | Status: DC
  Administered 2016-03-10: 1000 mg via INTRAVENOUS
  Filled 2016-03-08: qty 200

## 2016-03-08 MED ORDER — SODIUM CHLORIDE 0.9 % IV SOLN: 100.0000 mL | INTRAVENOUS | Status: DC | PRN

## 2016-03-08 MED ORDER — ACETAMINOPHEN 325 MG PO TABS
650.0000 mg | ORAL_TABLET | Freq: Four times a day (QID) | ORAL | Status: DC | PRN
  Administered 2016-03-09 – 2016-03-10 (×2): 650 mg via ORAL
  Filled 2016-03-08: qty 2

## 2016-03-08 MED ORDER — PENTAFLUOROPROP-TETRAFLUOROETH EX AERO: 1.0000 "application " | INHALATION_SPRAY | CUTANEOUS | Status: DC | PRN

## 2016-03-08 MED ORDER — PIPERACILLIN-TAZOBACTAM 3.375 G IVPB
3.3750 g | Freq: Two times a day (BID) | INTRAVENOUS | Status: DC
  Administered 2016-03-08 – 2016-03-09 (×4): 3.375 g via INTRAVENOUS
  Filled 2016-03-08 (×6): qty 50

## 2016-03-08 MED ORDER — LIDOCAINE-PRILOCAINE 2.5-2.5 % EX CREA
1.0000 "application " | TOPICAL_CREAM | CUTANEOUS | Status: DC | PRN
  Filled 2016-03-08: qty 5

## 2016-03-08 MED ORDER — ALTEPLASE 2 MG IJ SOLR: 2.0000 mg | Freq: Once | INTRAMUSCULAR | Status: DC | PRN

## 2016-03-08 MED ORDER — HEPARIN SODIUM (PORCINE) 1000 UNIT/ML DIALYSIS: 1000.0000 [IU] | INTRAMUSCULAR | Status: DC | PRN

## 2016-03-08 MED ORDER — HEPARIN SODIUM (PORCINE) 1000 UNIT/ML DIALYSIS: 5000.0000 [IU] | INTRAMUSCULAR | Status: DC | PRN

## 2016-03-08 MED ORDER — LIDOCAINE HCL (PF) 1 % IJ SOLN: 5.0000 mL | INTRAMUSCULAR | Status: DC | PRN

## 2016-03-08 MED ORDER — ORAL CARE MOUTH RINSE
15.0000 mL | Freq: Two times a day (BID) | OROMUCOSAL | Status: DC
  Administered 2016-03-09: 15 mL via OROMUCOSAL

## 2016-03-08 MED ORDER — ONDANSETRON HCL 4 MG/2ML IJ SOLN
4.0000 mg | Freq: Three times a day (TID) | INTRAMUSCULAR | Status: DC | PRN
  Administered 2016-03-09: 4 mg via INTRAVENOUS
  Filled 2016-03-08: qty 2

## 2016-03-08 NOTE — Progress Notes (Signed)
Notified resident [facility] about pt blood pressure of 62/44 (52). Pt is asymptomatic. At this time, no new orders received. Will continue to monitor.

## 2016-03-08 NOTE — Progress Notes (Signed)
Dialysis treatment completed.  4000 mL ultrafiltrated and net fluid removal 3500 mL.    Patient status unchanged. Lung sounds diminished to ausculation in all fields. Generalized moderate  edema. Cardiac: NSR.  Disconnected lines and removed needles.  Pressure held for 10 minutes and band aid/gauze dressing applied.  Report given to bedside RN, Delice Bison.

## 2016-03-08 NOTE — Progress Notes (Signed)
[facility] Progress Note   Subjective: pt just dc'd 10 days ago after hypotension admission. Presented now to ED with c/o chills and shaking, temp 100.  2-3 weeks of chills.  BP was in the 80's - 90's in ED.  Pt admitted to ICU.  Today looks fine, has no c/o's except for "my feet are swelling, need to get some fluid off with dialysis".  Denies SOB.  Not sleeping good.  No CP or rigors, no n/v/d.  CXR showed basilar atx and small effusion.  Asked to see for HD.    Vitals:   03/08/16 1000 03/08/16 1100 03/08/16 1151 03/08/16 1200  BP: (!) 86/67 (!) 86/65  (!) 80/61  Pulse: 77 76  78  Resp: (!) 27 (!) 25  (!) 22  Temp:   98.7 F (37.1 C)   TempSrc:   Oral   SpO2: 100% 100%  100%  Weight:      Height:        Inpatient medications: . allopurinol  100 mg Oral QHS  . amiodarone  200 mg Oral Daily  . heparin  5,000 Units Subcutaneous Q8H  . hydrocortisone  10 mg Oral Daily  . hydrocortisone  5 mg Oral QHS  . [START ON 03/09/2016] mouth rinse  15 mL Mouth Rinse BID  . midodrine  10 mg Oral TID WC  . pantoprazole  40 mg Oral Daily  . piperacillin-tazobactam (ZOSYN)  IV  3.375 g Intravenous Q12H  . sevelamer carbonate  800 mg Oral TID WC  . [START ON 03/10/2016] vancomycin  1,000 mg Intravenous Q M,W,F-HD    sodium chloride, calcium carbonate (dosed in mg elemental calcium)  Exam: Alert, kyphotic adult, nad No jvd Chest clear to bases bilat RRR +SEM 2/6  ABd obese, ntnd , no ascites LE's chronic deformities of feet/ legs, 2+ pitting  pretib and pedal edema bilat Neuro minmal movement of LE's, o/w Ox3, NF  Dialysis: [facility] 4h  105kg  2/2 bath  Hep 8000 then 2500  LUA AVF  - no meds  - Hb 14, CA 10.3, P 3.7, pth 105  - midodrine, coreg, amio, renvela 1 ac      Assessment: 1. Hypotension - acute on chronic issue.  Looks fine, not septic.  His low BP's are probably in large part related to RV failure and possibly also valve disease (has diseased aortic/  mitral, maybe pulm valves), and fluid overload is all R sided. Is 3-4kg up by wts, plan UF same with HD today.Cont midodrine long-term.  2. Volume - up 3-4 kg with LE/ pedal edema 3. Adrenal insuff - on maint steroids 4. ESRD HD MWF - didn't get much HD yesterday 5. Anemia of CKD - not an issue 6. 2HTPH - cont renvela 7. PAF - cont coreg, amio. No anticoag d/t hx GIB 8. 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  Stable from renal standpoint.         Vinson Moselle MD [facility] pager [phone]   03/08/2016, 2:54 PM    Recent Labs Lab 03/07/16 1819 03/07/16 2251  NA 138 137  K 3.9 3.9  CL 99* 101  CO2 30 23  GLUCOSE 80 78  BUN 10 11  CREATININE 3.23* 3.25*  CALCIUM 8.1* 7.6*  PHOS  --  1.9*    Recent  Labs Lab 03/07/16 1819 03/07/16 2251  AST 31 29  ALT 17 16*  ALKPHOS 57 61  BILITOT 1.3* 1.7*  PROT 6.4* 6.5  ALBUMIN 2.3* 2.3*    Recent Labs Lab 03/07/16 1819 03/07/16 2251  WBC 9.2 8.2  NEUTROABS 5.1  --   HGB 12.6* 12.7*  HCT 40.1 40.5  MCV 100.3* 101.3*  PLT 118* 108*   Iron/TIBC/Ferritin/ %Sat    Component Value Date/Time   IRON 45 06/16/2014 0445   TIBC 172 (L) 06/16/2014 0445   FERRITIN 240 03/16/2014 0723   IRONPCTSAT 26 06/16/2014 0445

## 2016-03-08 NOTE — Progress Notes (Signed)
Patient arrived to unit per bed.  Reviewed treatment plan and this RN agrees.  Report received from bedside RN, Johnny.  Consent obtained.  Patient A & O X 4. Lung sounds diminished to ausculation in all fields. Moderate BUE and BLE pitting edema. Cardiac: NSR.  Prepped LUAVF with alcohol and cannulated with two 15 gauge needles.  Pulsation of blood noted.  Flushed access well with saline per protocol.  Connected and secured lines and initiated tx at 1645.  UF goal of 4000 mL and net fluid removal of 3500 mL.  Will continue to monitor.

## 2016-03-08 NOTE — Progress Notes (Signed)
Unable to do urine culture and urinalysis. Did straight cath to collect urine specimen, but no urine output.  Danne Harbor

## 2016-03-08 NOTE — Progress Notes (Signed)
Pharmacy Antibiotic Note  Chad [name] is a 61 y.o. male admitted on 03/07/2016 with hypotension/chills post HD (r/o bacteremia).  Pharmacy has been consulted for Vancomycin and Zosyn dosing.  Pt received Zosyn 3.375gm ~1900 and Vanc 2gm ~2000 in ED  Plan: Zosyn 3.375gm IV q12h - doses over 4 hours Vancomycin 1000gm IV post HD Will f/u micro data, HD schedule, and pt's clinical condition Vanc pre-HD level prn   Height: 6\' 1"  (185.4 cm) Weight: 239 lb 6.7 oz (108.6 kg) IBW/kg (Calculated) : 79.9  Temp (24hrs), Avg:98.9 F (37.2 C), Min:98.3 F (36.8 C), Max:99.4 F (37.4 C)   Recent Labs Lab 03/07/16 1819 03/07/16 1829 03/07/16 2249 03/07/16 2251  WBC 9.2  --   --  8.2  CREATININE 3.23*  --   --  3.25*  LATICACIDVEN  --  1.92* 1.89  --     Estimated Creatinine Clearance: 31.6 mL/min (by C-G formula based on SCr of 3.25 mg/dL (H)).    No Known Allergies  Antimicrobials this admission: 2/9 Vanc >>  2/9 Zosyn >>   Dose adjustments this admission: n/a  Microbiology results: 2/9 BCx x2:   UCx:   Thank you for allowing pharmacy to be a part of this patient's care.  Lavonia Dana 03/08/2016 12:07 AM

## 2016-03-08 NOTE — H&P (Signed)
PULMONARY / CRITICAL CARE MEDICINE   Name: Chad [name] MRN: [id number] DOB: 1955/03/06    ADMISSION DATE:  03/07/2016 CONSULTATION DATE:  03/08/16   REFERRING MD:  Gordy Councilman Ruch  CHIEF COMPLAINT:  Hypotension   HISTORY OF PRESENT ILLNESS:   Chad [name] with pmhx of a-flutter ESRD, hypertension, systolic CHF, hepatitis C, adrenal insufficiency presenting with hypotension and chills after dialysis. Per dialysis center patient had a fever of 100. Patient indicates that he not been ill lately. He felt since his last dialysis treatment on Wednesday that he was a little bit more tired than usual. Otherwise patient denies any acute symptoms of cough, congestion, fevers or chills. Patient indicates that he has some abdominal discomfort, specifically in his epigastric area. He states that it started after he was given Tylenol while at the dialysis center. He states he this chronically after dialysis. Patient with epigastric pain on 1/25 ruled out for acute abdomen with CT abdomen/pelvis   Patient was recently seen for hypotension, rule out septic shock on 1/25. Patient was sent home midodrine and hydrocortisone for possible adrenal insufficiency.   In the ED patient was hypotensive to 67/49 with normal mentation.  Code sepsis was called and patient was admitted to ICU.        Review of Systems  Constitutional: Positive for chills and malaise/fatigue. Negative for fever.  HENT: Negative for congestion and sore throat.   Eyes: Negative for blurred vision and double vision.  Respiratory: Negative for cough and shortness of breath.   Cardiovascular: Positive for leg swelling. Negative for chest pain.  Gastrointestinal: Positive for abdominal pain. Negative for blood in stool, nausea and vomiting.  Genitourinary: Negative for dysuria and urgency.  Musculoskeletal: Positive for back pain.  Skin: Negative for rash.  Neurological: Negative for dizziness and headaches.   Psychiatric/Behavioral: Negative for depression and suicidal ideas.   BP (!) 83/67 (BP Location: Right Arm)   Pulse 82   Temp 98 F (36.7 C) (Oral)   Resp (!) 21   Ht 6\' 1"  (1.854 m)   Wt 239 lb 10.2 oz (108.7 kg)   SpO2 100%   BMI 31.62 kg/m   HEMODYNAMICS:    VENTILATOR SETTINGS:    INTAKE / OUTPUT: I/O last 3 completed shifts: In: 4790 [P.O.:240; I.V.:3050; IV Piggyback:1500] Out: -   PHYSICAL EXAMINATION: General:  Chronically ill-appearing elderly man, no acute distress Neuro:  Movable all his limbs, alert and orientated 3, wants to have HD HEENT:  Moist mucous membranes,  PERRLA Cardiovascular:  RRR, 3 out of 5 systolic murmur Lungs:  Bibasilar crackles noted, no increased work of breathing Abdomen:  BS +, slightly tender epigastric area, slightly distended, scar from transplant surgery Musculoskeletal:  Bilateral edema 3+ to the knee, 3+ along the lateral hip Skin:  Skin changes associated with chronic edema, tender to palpation  LABS:  BMET  Recent Labs Lab 03/07/16 1819 03/07/16 2251  NA 138 137  K 3.9 3.9  CL 99* 101  CO2 30 23  BUN 10 11  CREATININE 3.23* 3.25*  GLUCOSE 80 78    Electrolytes  Recent Labs Lab 03/07/16 1819 03/07/16 2251  CALCIUM 8.1* 7.6*  MG  --  1.6*  PHOS  --  1.9*    CBC  Recent Labs Lab 03/07/16 1819 03/07/16 2251  WBC 9.2 8.2  HGB 12.6* 12.7*  HCT 40.1 40.5  PLT 118* 108*    Coag's  Recent Labs Lab 03/07/16 2251  APTT 30  INR 1.16    Sepsis Markers  Recent Labs Lab 03/07/16 1819 03/07/16 1829 03/07/16 2249  LATICACIDVEN  --  1.92* 1.89  PROCALCITON 0.61  --   --     ABG No results for input(s): PHART, PCO2ART, PO2ART in the last 168 hours.  Liver Enzymes  Recent Labs Lab 03/07/16 1819 03/07/16 2251  AST 31 29  ALT 17 16*  ALKPHOS 57 61  BILITOT 1.3* 1.7*  ALBUMIN 2.3* 2.3*    Cardiac Enzymes  Recent Labs Lab 03/07/16 1819  TROPONINI 0.31*    Glucose  Recent  Labs Lab 03/07/16 2346  GLUCAP 111*    Imaging Dg Chest 2 View  Result Date: 03/07/2016 CLINICAL DATA:  Chills and fever EXAM: CHEST  2 VIEW COMPARISON:  02/23/2016 FINDINGS: Cardiac shadow is enlarged. Aortic calcifications are again noted. The overall inspiratory effort is poor with bibasilar atelectasis. Right-sided pleural effusion is noted. Multilevel compression deformities are noted stable from the prior exam. IMPRESSION: Bibasilar atelectasis and right-sided pleural effusion. Electronically Signed   By: Alcide Clever M.D.   On: 03/07/2016 20:07     STUDIES:  CXR 2/9>>  CULTURES: BCX 2/9 >> UCX 2/9 >>  ANTIBIOTICS: Vanc 2/9 >> Zosyn 2/9>>  SIGNIFICANT EVENTS: Admit to ICU 2/9>>  LINES/TUBES: None   DISCUSSION: Chad [name] is 61 y.o. male with pmhx a-flutter ESRD, hypertension, systolic CHF, hepatitis C, adrenal insufficiency presenting for severe hypotension. Previously admitted for similar symptoms on 1/25, not found to be due to sepsis at the time. Thought more likely to be multifactorial/with some component of adrenal insufficiency. As patient is currently afebrile with normal mentation and without any symptoms acute illness on H&P think that this is likely his chronic hypotension.  ASSESSMENT / PLAN:  PULMONARY  A: CXR concerning for pulmonary edema, received 1 L in the ED, crackles noted on exam Mediastinal  nodule noted on last CT chest Pulmonary hypertension, PA peak pressure 43 mmHg P:   Outpatient follow-up for mediastinal nodule Sats > 88% Follow CXR   CARDIOVASCULAR A:  Severe Hypotension, concern for septic shock, likely chronic hypotension + dialysis induced  Systolic CHF, last echo 50 to 21%, G DD 1, functional bicuspid aortic valve, moderate aortic stenosis, moderate mitral valve stenosis BNP elevated  A-flutter, no treatment due to GI bleed in the past P:  Continue amiodarone Maintain MAP> 55 Will need dialysis soon Continue midodrine   Limit fluid resuscitation  Received Decadron in ED  Will start home hydrocortisone  Consider adding florinef   RENAL A:   ESRD MWF Electrolytes stable  P:   Consult nephrology needs dialysis  GASTROINTESTINAL A:   Nutrition  Chronic epigastric abdominal pain, Normal CT abdomen/pelvis in 1/25 Hx of GI bleed  P:   Follow abdominal pain for resolution  Protonix  Renal Diet   HEMATOLOGIC A:   Chronic Thrombocytopenia  P:  Coag studies  DVT prophylaxis  CBC in the AM   INFECTIOUS A:   Rule out septic shock, no source found Lactic acid slightly elevated 1.9   P:   Trend lactic acid Trend pro calcitonin  Vancomycin and Zosyn  Cultures pending   ENDOCRINE A:   Adrenal insufficiency P:   Consider obtaining Cortisol level but would not be accurate with steroids on board Continue hydrocortisone , decadron 20 mg x 1  NEUROLOGIC A:   Alert and oriented 3 P:   Continue to follow mentation  Wants to be dialyzed    Brett Canales  Minor ACNP [facility] PCCM Pager [phone] till 3 pm If no answer page [phone] 03/08/2016, 9:16 AM  Attending Note:  61 year old ESRD patient with chronic hypotension, baseline is 80 and in HD is allowed to drop in the 70's per Dr. Arlean Hopping.  On exam, patient is alert and interactive, lungs are clear.  I reviewed CXR myself, no acute changes.  Discussed with PCCM-NP and Renal-MD.  Hypotension:  - Midodrine  - Stress dose steroids  - No further fluid resuscitation  Hypoxemia: due to pulmonary edema  - O2 and titrate for SBP<100   - May need ambulatory desat study for home O2  Acute pulmonary edema  - HD today  Sepsis  - Vanc  - Zosyn  - Procalcitonin  - F/U on cultures  Transfer patient to SDU and to [facility] service with PCCM off 2/11.  Patient seen and examined, agree with above plan.  Alyson Reedy, M.D. [facility] Pulmonary/Critical Care Medicine. Pager: [phone]. After hours pager: [phone].

## 2016-03-08 NOTE — Progress Notes (Signed)
eLink Physician-Brief Progress Note Patient Name: Chad [name] DOB: 12/08/1955 MRN: [id number]   Date of Service  03/08/2016  HPI/Events of Note  Routine medications for pain and nausea  eICU Interventions  PRN tylenol and zofran ordered     Intervention Category Minor Interventions: Routine modifications to care plan (e.g. PRN medications for pain, fever)  Zannie Runkle 03/08/2016, 11:26 PM

## 2016-03-09 DIAGNOSIS — Z992 Dependence on renal dialysis: Secondary | ICD-10-CM | POA: Diagnosis not present

## 2016-03-09 DIAGNOSIS — I959 Hypotension, unspecified: Secondary | ICD-10-CM | POA: Diagnosis not present

## 2016-03-09 DIAGNOSIS — I5032 Chronic diastolic (congestive) heart failure: Secondary | ICD-10-CM | POA: Diagnosis not present

## 2016-03-09 DIAGNOSIS — I953 Hypotension of hemodialysis: Secondary | ICD-10-CM | POA: Diagnosis not present

## 2016-03-09 DIAGNOSIS — N186 End stage renal disease: Secondary | ICD-10-CM | POA: Diagnosis not present

## 2016-03-09 LAB — CBC
HCT: 39.7 % (ref 39.0–52.0)
HEMOGLOBIN: 12.6 g/dL — AB (ref 13.0–17.0)
MCH: 31.6 pg (ref 26.0–34.0)
MCHC: 31.7 g/dL (ref 30.0–36.0)
MCV: 99.5 fL (ref 78.0–100.0)
Platelets: 160 10*3/uL (ref 150–400)
RBC: 3.99 MIL/uL — ABNORMAL LOW (ref 4.22–5.81)
RDW: 17.4 % — ABNORMAL HIGH (ref 11.5–15.5)
WBC: 17.7 10*3/uL — AB (ref 4.0–10.5)

## 2016-03-09 MED ORDER — PROMETHAZINE HCL 25 MG/ML IJ SOLN
25.0000 mg | Freq: Once | INTRAMUSCULAR | Status: AC
  Administered 2016-03-09: 25 mg via INTRAVENOUS
  Filled 2016-03-09: qty 1

## 2016-03-09 NOTE — Progress Notes (Signed)
Pt states that he no longer wears CPAP at home and does not need one here.

## 2016-03-09 NOTE — Progress Notes (Signed)
PROGRESS NOTE  Chad [name]:[id number] DOB: 1955/12/29 DOA: 03/07/2016 PCP: [name], MD   LOS: 2 days   Brief Narrative: Briefly, [name] is a 36M with PMH significant for ESRD on MWF iHD via LUE AVF, paroxysmal a flutter, history of cardiomyopathy (last EF 55%), hep C, adrenal insufficiency from chronic steroids, failed prior renal txp, who presents from dialysis with chills, hypotension and reported temp 100.0. Patient was admitted to critical care service on 2/9, and was transferred to the hospitalist team on 2/11.  Assessment & Plan: Active Problems:   Dilated cardiomyopathy (HCC)   OSA (obstructive sleep apnea)   CHF (congestive heart failure) (HCC)   PAF (paroxysmal atrial fibrillation) (HCC)   History of renal transplant   Long term current use of systemic steroids   Pulmonary hypertension   HLD (hyperlipidemia)   ESRD on dialysis (HCC)   Hypotension  Hypotension - Likely multifactorial, RV failure, he is end-stage renal patient, he is on Midorin, continue. Adrenal insufficiency as part of the differential as well, patient has been on and off prednisone for a number of years due to his renal transplant. He is on hydrocortisone, continue. - Supposed to be evaluated by endocrinology in March - Unlikely infectious etiology, however we'll continue antibiotics and discontinued at 48 hours if cultures remain negative - He is asymptomatic, hypotension is chronic, discussed with nephrology Dr. Arlean Hopping this morning, will  tolerate blood pressure into the 80s and into the 70s with HD  Abdominal pain - Most recent CT scan done 2 weeks ago negative. He has been having chronic abdominal pain for years associated with hemodialysis.  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 fib flutter - He is in sinus, continue amiodarone. He is not on anticoagulation due to history of GI bleed. Chads vasc score greater than 2  Anterior mediastinal density - further work up as outpatient.  Thrombocytopenia - Chronic. Follow CBC.   DVT prophylaxis: heparin Code Status: Full code Family Communication: no family bedside Disposition Plan: home 2-3 days  Consultants:   Nephrology  PCCM  Procedures:   HD  Antimicrobials:   Vancomycin/Zosyn 2/9 >>  Subjective: - no chest pain, shortness of breath, no abdominal pain, nausea or vomiting.   Objective: Vitals:   03/09/16 0400 03/09/16 0500 03/09/16 0600 03/09/16 0700  BP: (!) 82/62 (!) 79/59 (!) 88/65 (!) 84/56  Pulse: 90 90 85 82  Resp: (!) 23 (!) 28 (!) 29 (!) 23  Temp:      TempSrc:      SpO2: 93% 100% 100% 100%  Weight:      Height:        Intake/Output Summary (Last 24 hours) at 03/09/16 1057 Last data filed at 03/09/16 0932  Gross per 24 hour  Intake              300 ml  Output             3860 ml  Net            -3560 ml   Filed Weights   03/08/16 2030 03/08/16 2100  03/09/16 0310  Weight: 105.3 kg (232 lb 2.3 oz) 104.6 kg (230 lb 9.6 oz) 105.2 kg (231 lb 14.8 oz)    Examination: Constitutional: NAD Vitals:   03/09/16 0400 03/09/16 0500 03/09/16 0600 03/09/16 0700  BP: (!) 82/62 (!) 79/59 (!) 88/65 (!) 84/56  Pulse: 90 90 85 82  Resp: (!) 23 (!) 28 (!) 29 (!) 23  Temp:      TempSrc:      SpO2: 93% 100% 100% 100%  Weight:      Height:       Eyes: PERRL, lids and conjunctivae normal Respiratory: clear to auscultation bilaterally, no wheezing, no crackles. Normal respiratory effort. No accessory muscle use. Kyphotic Cardiovascular: Regular rate and rhythm,  3/6 SEM. 2+ LE edema. 2+ pedal pulses.  Abdomen: no tenderness. Bowel sounds positive.  Musculoskeletal: no clubbing / cyanosis.  Skin: no rashes, lesions, ulcers. No induration Neurologic: non focal    Data Reviewed: I have personally reviewed following labs and imaging studies  CBC:  Recent Labs Lab 03/07/16 1819 03/07/16 2251 03/08/16 1650 03/09/16 0528  WBC 9.2 8.2 13.1* 17.7*  NEUTROABS 5.1  --   --   --   HGB 12.6* 12.7* 12.2* 12.6*  HCT 40.1 40.5 38.2* 39.7  MCV 100.3* 101.3* 100.0 99.5  PLT 118* 108* 154 160   Basic Metabolic Panel:  Recent Labs Lab 03/07/16 1819 03/07/16 2251 03/08/16 1650  NA 138 137 136  K 3.9 3.9 4.2  CL 99* 101 102  CO2 30 23 22   GLUCOSE 80 78 181*  BUN 10 11 21*  CREATININE 3.23* 3.25* 4.30*  CALCIUM 8.1* 7.6* 8.1*  MG  --  1.6*  --   PHOS  --  1.9* 2.1*   GFR: Estimated Creatinine Clearance: 23.5 mL/min (by C-G formula based on SCr of 4.3 mg/dL (H)). Liver Function Tests:  Recent Labs Lab 03/07/16 1819 03/07/16 2251 03/08/16 1650  AST 31 29  --   ALT 17 16*  --   ALKPHOS 57 61  --   BILITOT 1.3* 1.7*  --   PROT 6.4* 6.5  --   ALBUMIN 2.3* 2.3* 2.1*   No results for input(s): LIPASE, AMYLASE in the last 168 hours. No results for input(s): AMMONIA in the last 168 hours. Coagulation Profile:  Recent Labs Lab 03/07/16 2251  INR 1.16   Cardiac Enzymes:  Recent Labs Lab 03/07/16 1819  TROPONINI 0.31*   BNP (last 3 results) No results for input(s): PROBNP in the last 8760 hours. HbA1C: No results for input(s): HGBA1C in the last 72 hours. CBG:  Recent Labs Lab 03/07/16 2346  GLUCAP 111*   Lipid Profile: No results for input(s): CHOL, HDL, LDLCALC, TRIG, CHOLHDL, LDLDIRECT in the last 72 hours. Thyroid Function Tests: No results for input(s): TSH, T4TOTAL, FREET4, T3FREE, THYROIDAB in the last 72 hours. Anemia Panel: No results for input(s): VITAMINB12, FOLATE, FERRITIN, TIBC, IRON, RETICCTPCT in the  last 72 hours. Urine analysis:    Component Value Date/Time   COLORURINE YELLOW 03/18/2014 1730   APPEARANCEUR CLEAR 03/18/2014 1730   LABSPEC 1.013 03/18/2014 1730   PHURINE 5.0 03/18/2014 1730   GLUCOSEU NEGATIVE 03/18/2014 1730   HGBUR NEGATIVE 03/18/2014 1730   BILIRUBINUR NEGATIVE 03/18/2014 1730   KETONESUR NEGATIVE 03/18/2014 1730   PROTEINUR 30 (A) 03/18/2014 1730   UROBILINOGEN 0.2 03/18/2014 1730   NITRITE NEGATIVE 03/18/2014 1730   LEUKOCYTESUR NEGATIVE 03/18/2014 1730   Sepsis Labs: Invalid input(s): PROCALCITONIN, LACTICIDVEN  Recent Results (  from the past 240 hour(s))  Blood Culture (routine x 2)     Status: None (Preliminary result)   Collection Time: 03/07/16  6:19 PM  Result Value Ref Range Status   Specimen Description BLOOD RIGHT ANTECUBITAL  Final   Special Requests AEROBIC BOTTLE ONLY  5CC  Final   Culture NO GROWTH < 24 HOURS  Final   Report Status PENDING  Incomplete  Blood Culture (routine x 2)     Status: None (Preliminary result)   Collection Time: 03/07/16  7:03 PM  Result Value Ref Range Status   Specimen Description BLOOD RIGHT HAND  Final   Special Requests AEROBIC BOTTLE ONLY 5CC  Final   Culture NO GROWTH < 24 HOURS  Final   Report Status PENDING  Incomplete      Radiology Studies: Dg Chest 2 View  Result Date: 03/07/2016 CLINICAL DATA:  Chills and fever EXAM: CHEST  2 VIEW COMPARISON:  02/23/2016 FINDINGS: Cardiac shadow is enlarged. Aortic calcifications are again noted. The overall inspiratory effort is poor with bibasilar atelectasis. Right-sided pleural effusion is noted. Multilevel compression deformities are noted stable from the prior exam. IMPRESSION: Bibasilar atelectasis and right-sided pleural effusion. Electronically Signed   By: Alcide Clever M.D.   On: 03/07/2016 20:07     Scheduled Meds: . allopurinol  100 mg Oral QHS  . amiodarone  200 mg Oral Daily  . heparin  5,000 Units Subcutaneous Q8H  . hydrocortisone  10 mg Oral  Daily  . hydrocortisone  5 mg Oral QHS  . mouth rinse  15 mL Mouth Rinse BID  . midodrine  10 mg Oral TID WC  . pantoprazole  40 mg Oral Daily  . piperacillin-tazobactam (ZOSYN)  IV  3.375 g Intravenous Q12H  . sevelamer carbonate  800 mg Oral TID WC  . [START ON 03/10/2016] vancomycin  1,000 mg Intravenous Q M,W,F-HD   Continuous Infusions:   Pamella Pert, MD, PhD [name] Pager [phone]  If 7PM-7AM, please contact night-coverage [email or web address] Password [email or web address] 03/09/2016, 10:57 AM

## 2016-03-09 NOTE — Plan of Care (Signed)
Problem: Pain Managment: Goal: General experience of comfort will improve Outcome: Not Met (add Reason) Chronic abdominal pain overnight w/nausea

## 2016-03-09 NOTE — Progress Notes (Signed)
[facility] Progress Note   Subjective: no c/o's, got 3.5 L off at HD yest with BP's in 70's and no c/o's from pt. Legs are "still swollen".     Vitals:   03/09/16 0400 03/09/16 0500 03/09/16 0600 03/09/16 0700  BP: (!) 82/62 (!) 79/59 (!) 88/65 (!) 84/56  Pulse: 90 90 85 82  Resp: (!) 23 (!) 28 (!) 29 (!) 23  Temp:      TempSrc:      SpO2: 93% 100% 100% 100%  Weight:      Height:        Inpatient medications: . allopurinol  100 mg Oral QHS  . amiodarone  200 mg Oral Daily  . heparin  5,000 Units Subcutaneous Q8H  . hydrocortisone  10 mg Oral Daily  . hydrocortisone  5 mg Oral QHS  . mouth rinse  15 mL Mouth Rinse BID  . midodrine  10 mg Oral TID WC  . pantoprazole  40 mg Oral Daily  . piperacillin-tazobactam (ZOSYN)  IV  3.375 g Intravenous Q12H  . sevelamer carbonate  800 mg Oral TID WC  . [START ON 03/10/2016] vancomycin  1,000 mg Intravenous Q M,W,F-HD    sodium chloride, acetaminophen, calcium carbonate (dosed in mg elemental calcium), ondansetron  Exam: Alert, kyphotic adult, nad No jvd Chest clear to bases bilat RRR +SEM 2/6  ABd obese, ntnd , no ascites LE's chronic deformities of feet/ legs, 2+ pitting  pretib and pedal edema bilat Neuro minmal movement of LE's, o/w Ox3, NF  Dialysis: [facility] 4h  105kg  2/2 bath  Hep 8000 then 2500  LUA AVF  - no meds  - Hb 14, CA 10.3, P 3.7, pth 105  - midodrine, coreg, amio, renvela 1 ac      Assessment: 1. Hypotension - chronic issue, worsening over last couple yrs according to patient.  Baseline BP now is 80's and dialyzes in the 70's. Unclear cause, has known RV failure, is on steroids for poss AI.  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 No new suggestions.  We raised dry wt 4-5kg last admission hoping to help BP but it hasn't helped and now legs are edematous. We will gradually taper down dry wt back towards 101kg in OP setting as tolerated.  2. Adrenal  insuff - on maint steroids 3. ESRD HD MWF 4. Anemia of CKD - not an issue 5. 2HTPH - cont renvela 6. PAF - cont coreg, amio. No anticoag d/t hx GIB 7. 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 EOL - briefly d/w patient in light of worsening BP's.  He wants full code.  9. Dispo - is ok for dc from renal standpoint  Plan -  HD Monday , 1st shift  Vinson Moselle MD [facility] pager [phone]   03/09/2016, 10:01 AM    Recent Labs Lab 03/07/16 1819 03/07/16 2251 03/08/16 1650  NA 138 137 136  K 3.9 3.9 4.2  CL 99* 101 102  CO2 30 23 22   GLUCOSE 80 78 181*  BUN 10 11 21*  CREATININE 3.23* 3.25* 4.30*  CALCIUM 8.1* 7.6* 8.1*  PHOS  --  1.9* 2.1*    Recent Labs Lab 03/07/16 1819 03/07/16 2251 03/08/16 1650  AST 31 29  --   ALT 17 16*  --   ALKPHOS 57 61  --   BILITOT 1.3*  1.7*  --   PROT 6.4* 6.5  --   ALBUMIN 2.3* 2.3* 2.1*    Recent Labs Lab 03/07/16 1819 03/07/16 2251 03/08/16 1650 03/09/16 0528  WBC 9.2 8.2 13.1* 17.7*  NEUTROABS 5.1  --   --   --   HGB 12.6* 12.7* 12.2* 12.6*  HCT 40.1 40.5 38.2* 39.7  MCV 100.3* 101.3* 100.0 99.5  PLT 118* 108* 154 160   Iron/TIBC/Ferritin/ %Sat    Component Value Date/Time   IRON 45 06/16/2014 0445   TIBC 172 (L) 06/16/2014 0445   FERRITIN 240 03/16/2014 0723   IRONPCTSAT 26 06/16/2014 0445

## 2016-03-10 DIAGNOSIS — I953 Hypotension of hemodialysis: Secondary | ICD-10-CM | POA: Diagnosis not present

## 2016-03-10 DIAGNOSIS — I5032 Chronic diastolic (congestive) heart failure: Secondary | ICD-10-CM

## 2016-03-10 DIAGNOSIS — N186 End stage renal disease: Secondary | ICD-10-CM | POA: Diagnosis not present

## 2016-03-10 DIAGNOSIS — Z992 Dependence on renal dialysis: Secondary | ICD-10-CM | POA: Diagnosis not present

## 2016-03-10 DIAGNOSIS — I959 Hypotension, unspecified: Secondary | ICD-10-CM | POA: Diagnosis not present

## 2016-03-10 LAB — CBC
HEMATOCRIT: 43.1 % (ref 39.0–52.0)
HEMOGLOBIN: 13.5 g/dL (ref 13.0–17.0)
MCH: 31.6 pg (ref 26.0–34.0)
MCHC: 31.3 g/dL (ref 30.0–36.0)
MCV: 100.9 fL — AB (ref 78.0–100.0)
Platelets: 161 10*3/uL (ref 150–400)
RBC: 4.27 MIL/uL (ref 4.22–5.81)
RDW: 17.8 % — ABNORMAL HIGH (ref 11.5–15.5)
WBC: 16.2 10*3/uL — ABNORMAL HIGH (ref 4.0–10.5)

## 2016-03-10 LAB — BASIC METABOLIC PANEL
Anion gap: 11 (ref 5–15)
BUN: 30 mg/dL — AB (ref 6–20)
CHLORIDE: 99 mmol/L — AB (ref 101–111)
CO2: 25 mmol/L (ref 22–32)
CREATININE: 4.5 mg/dL — AB (ref 0.61–1.24)
Calcium: 8.3 mg/dL — ABNORMAL LOW (ref 8.9–10.3)
GFR calc Af Amer: 15 mL/min — ABNORMAL LOW (ref >60)
GFR calc non Af Amer: 13 mL/min — ABNORMAL LOW (ref >60)
Glucose, Bld: 126 mg/dL — ABNORMAL HIGH (ref 65–99)
Potassium: 4.5 mmol/L (ref 3.5–5.1)
Sodium: 135 mmol/L (ref 135–145)

## 2016-03-10 MED ORDER — PENTAFLUOROPROP-TETRAFLUOROETH EX AERO
1.0000 | INHALATION_SPRAY | CUTANEOUS | Status: DC | PRN
Start: 2016-03-10 — End: 2016-03-10

## 2016-03-10 MED ORDER — HEPARIN SODIUM (PORCINE) 1000 UNIT/ML DIALYSIS: 1000.0000 [IU] | INTRAMUSCULAR | Status: DC | PRN

## 2016-03-10 MED ORDER — LIDOCAINE-PRILOCAINE 2.5-2.5 % EX CREA
1.0000 | TOPICAL_CREAM | CUTANEOUS | Status: DC | PRN
Start: 2016-03-10 — End: 2016-03-10

## 2016-03-10 MED ORDER — SODIUM CHLORIDE 0.9 % IV SOLN: 100.0000 mL | INTRAVENOUS | Status: DC | PRN

## 2016-03-10 MED ORDER — LIDOCAINE HCL (PF) 1 % IJ SOLN: 5.0000 mL | INTRAMUSCULAR | Status: DC | PRN

## 2016-03-10 MED ORDER — ACETAMINOPHEN 325 MG PO TABS
ORAL_TABLET | ORAL | Status: AC
  Filled 2016-03-10: qty 2

## 2016-03-10 MED ORDER — VANCOMYCIN HCL IN DEXTROSE 1-5 GM/200ML-% IV SOLN
INTRAVENOUS | Status: AC
  Filled 2016-03-10: qty 200

## 2016-03-10 MED ORDER — SODIUM CHLORIDE 0.9 % IV SOLN
100.0000 mL | INTRAVENOUS | Status: DC | PRN
Start: 2016-03-10 — End: 2016-03-10

## 2016-03-10 MED ORDER — HEPARIN SODIUM (PORCINE) 1000 UNIT/ML DIALYSIS: 5000.0000 [IU] | INTRAMUSCULAR | Status: DC | PRN

## 2016-03-10 MED ORDER — ALTEPLASE 2 MG IJ SOLR: 2.0000 mg | Freq: Once | INTRAMUSCULAR | Status: DC | PRN

## 2016-03-10 NOTE — Care Management Note (Addendum)
Case Management Note  Patient Details  Name: Chad [name] MRN: [id number] Date of Birth: Jun 24, 1955  Subjective/Objective:             Adm w hypotension       Action/Plan: lives w fam, pcp dr patel   Expected Discharge Date:                  Expected Discharge Plan:  Home/Self Care  In-House Referral:     Discharge planning Services     Post Acute Care Choice:    Choice offered to:     DME Arranged:    DME Agency:     HH Arranged:    HH Agency:     Status of Service:  In process, will continue to follow  If discussed at Long Length of Stay Meetings, dates discussed:    Additional Comments:esrd on hd, being dc home. Lives w son. Uses w/c. Want to go home by ambulance. nse to send home w ptar. Med necessity form filled out.  Hanley Hays, RN 03/10/2016, 12:51 PM

## 2016-03-10 NOTE — Discharge Instructions (Signed)
Follow with [name], MD in 2-3 weeks  Please get a complete blood count and chemistry panel checked by your Primary MD at your next visit, and again as instructed by your Primary MD. Please get your medications reviewed and adjusted by your Primary MD.  Please request your Primary MD to go over all Hospital Tests and Procedure/Radiological results at the follow up, please get all Hospital records sent to your Prim MD by signing hospital release before you go home.  If you had Pneumonia of Lung problems at the Hospital: Please get a 2 view Chest X ray done in 6-8 weeks after hospital discharge or sooner if instructed by your Primary MD.  If you have Congestive Heart Failure: Please call your Cardiologist or Primary MD anytime you have any of the following symptoms:  1) 3 pound weight gain in 24 hours or 5 pounds in 1 week  2) shortness of breath, with or without a dry hacking cough  3) swelling in the hands, feet or stomach  4) if you have to sleep on extra pillows at night in order to breathe  Follow cardiac low salt diet and 1.5 lit/day fluid restriction.  If you have diabetes Accuchecks 4 times/day, Once in AM empty stomach and then before each meal. Log in all results and show them to your primary doctor at your next visit. If any glucose reading is under 80 or above 300 call your primary MD immediately.  If you have Seizure/Convulsions/Epilepsy: Please do not drive, operate heavy machinery, participate in activities at heights or participate in high speed sports until you have seen by Primary MD or a Neurologist and advised to do so again.  If you had Gastrointestinal Bleeding: Please ask your Primary MD to check a complete blood count within one week of discharge or at your next visit. Your endoscopic/colonoscopic biopsies that are pending at the time of discharge, will also need to followed by your Primary MD.  Get Medicines reviewed and adjusted. Please take all your  medications with you for your next visit with your Primary MD  Please request your Primary MD to go over all hospital tests and procedure/radiological results at the follow up, please ask your Primary MD to get all Hospital records sent to his/her office.  If you experience worsening of your admission symptoms, develop shortness of breath, life threatening emergency, suicidal or homicidal thoughts you must seek medical attention immediately by calling 911 or calling your MD immediately  if symptoms less severe.  You must read complete instructions/literature along with all the possible adverse reactions/side effects for all the Medicines you take and that have been prescribed to you. Take any new Medicines after you have completely understood and accpet all the possible adverse reactions/side effects.   Do not drive or operate heavy machinery when taking Pain medications.   Do not take more than prescribed Pain, Sleep and Anxiety Medications  Special Instructions: If you have smoked or chewed Tobacco  in the last 2 yrs please stop smoking, stop any regular Alcohol  and or any Recreational drug use.  Wear Seat belts while driving.  Please note You were cared for by a hospitalist during your hospital stay. If you have any questions about your discharge medications or the care you received while you were in the hospital after you are discharged, you can call the unit and asked to speak with the hospitalist on call if the hospitalist that took care of you is not available. Once  you are discharged, your primary care physician will handle any further medical issues. Please note that NO REFILLS for any discharge medications will be authorized once you are discharged, as it is imperative that you return to your primary care physician (or establish a relationship with a primary care physician if you do not have one) for your aftercare needs so that they can reassess your need for medications and monitor your  lab values.  You can reach the hospitalist office at phone [phone] or fax [phone]   If you do not have a primary care physician, you can call [phone] for a physician referral.  Activity: As tolerated with Full fall precautions use walker/cane & assistance as needed  Diet: renal  Disposition Home

## 2016-03-10 NOTE — Discharge Summary (Signed)
Physician Discharge Summary  Chad [name] ZOX:[id number] DOB: 06-20-1955 DOA: 03/07/2016  PCP: Chad [name]  Admit date: 03/07/2016 Discharge date: 03/11/2016  Admitted From: home Disposition:  home  Recommendations for Outpatient Follow-up:  1. Follow up with PCP in 1-2 weeks 2. 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 He denies any infectious symptoms - no sick contacts, no subjective fever, no cough / sputum / chest pain / shortness of breath / nausea / vomiting / diarrhea / dizziness / vison changes. He has been having chills like the one today for weeks to months. His legs have been much more swollen over the past few weeks, but no acute changes. He was recently admitted for similar findings and ultimately discharged with a new dry weight (106kg), midodrine 10 TID and cortef 10mg  qAM and 5mg  Qpm. He was only taking midodrine on dialysis days as a one time dose. His exam is notable for sharp cognition, clear chest, normal heart tones, II/VI SEM, mild abdominal tenderness over the epigastrium and bilateral lower extremity tense pitting edema with erythema and warmth. No open sores or drainage. Labs showed normal WBC, very slightly elevated lactate, adequate renal parameters. Of note, no BP response to fluid challenge.   Hospital Course: Discharge Diagnoses:  Active Problems:   Dilated cardiomyopathy (HCC)   OSA (obstructive sleep apnea)   CHF (congestive heart failure) (HCC)   PAF (paroxysmal atrial fibrillation)  (HCC)   History of renal transplant   Long term current use of systemic steroids   Pulmonary hypertension   HLD (hyperlipidemia)   ESRD on dialysis (HCC)   Hypotension   Hypotension - patient was admitted to the hospital with hypotension and concern for sepsis, however there was no apparent infectious source on admission. His hypotension is likely multifactorial, RV failure, he is end-stage renal patient. He was empirically started on broad spectrum antibiotics and cultures were sent. His cultures remained negative and his antibiotics discontinued as it is felt that his hypotension is more of a chronic nature rather than due to an acute infectious process. Discussed with nephrology, his BP has been this way for some time and they are tolerating 80s at baseline and in the 70s with HD. He underwent HD x 2 while hospitalized without significant issues. Adrenal insufficiency as part of the differential as well, patient has been on and off prednisone for a number of years due to his renal transplant. He is on hydrocortisone, continue. Has follow up with Endocrinology next month Abdominal pain - Most recent CT scan done 2 weeks ago negative. He has been having chronic abdominal pain for years associated with hemodialysis. 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 Most recent 2-D echo done in January 2018, showed moderate pulmonary hypertension, normal EF 50-55%, RV dilation and systolic dysfunction on the right Obstructive sleep apnea - he no longer uses his Cpap End-stage renal disease on hemodialysis - Patient failed renal transplant, continue HD Paroxysmal A.  fib flutter - He is in sinus, continue amiodarone. He is not on anticoagulation due to history of GI bleed. Chads vasc score greater than 2 Anterior mediastinal density - further work up as  outpatient. Thrombocytopenia - Chronic. Follow CBC.   Discharge Instructions   Allergies as of 03/10/2016   No Known Allergies     Medication List    TAKE these medications   allopurinol 100 MG tablet Commonly known as:  ZYLOPRIM Take 1 tablet (100 mg total) by mouth 2 (two) times daily. What changed:  when to take this   amiodarone 200 MG tablet Commonly known as:  PACERONE TAKE 1 TABLET BY MOUTH EVERY DAY What changed:  See the new instructions.   aspirin EC 81 MG tablet Take 81 mg by mouth at bedtime.   atorvastatin 40 MG tablet Commonly known as:  LIPITOR Take 1 tablet (40 mg total) by mouth at bedtime.   calcium carbonate (dosed in mg elemental calcium) 1250 MG/5ML Take 15 mLs (1,500 mg of elemental calcium total) by mouth every 6 (six) hours as needed for indigestion.   hydrocortisone 5 MG tablet Commonly known as:  CORTEF Take 1-2 tablets (5-10 mg total) by mouth 2 (two) times daily. Take 10mg  in am and 5mg  in pm   midodrine 5 MG tablet Commonly known as:  PROAMATINE Take 2 tablets (10 mg total) by mouth 3 (three) times daily with meals. For HD What changed:  when to take this  additional instructions   PROBIOTIC PO Take 1 tablet by mouth at bedtime.   saccharomyces boulardii 250 MG capsule Commonly known as:  FLORASTOR Take 1 capsule (250 mg total) by mouth 2 (two) times daily.   sevelamer carbonate 800 MG tablet Commonly known as:  RENVELA Take 800 mg by mouth 3 (three) times daily with meals.      Follow-up Information    Chad [name]. Schedule an appointment as soon as possible for a visit in 2 week(s).   Specialty:  Nephrology Contact information: [address] [place] [address] [phone]          No Known Allergies  Consultations:  Nephrology   PCCM  Procedures/Studies:  HD  Dg Chest 2 View  Result Date: 03/07/2016 CLINICAL DATA:  Chills and fever EXAM: CHEST  2 VIEW COMPARISON:  02/23/2016 FINDINGS: Cardiac  shadow is enlarged. Aortic calcifications are again noted. The overall inspiratory effort is poor with bibasilar atelectasis. Right-sided pleural effusion is noted. Multilevel compression deformities are noted stable from the prior exam. IMPRESSION: Bibasilar atelectasis and right-sided pleural effusion. Electronically Signed   By: Alcide Clever M.D.   On: 03/07/2016 20:07   Dg Chest 2 View  Result Date: 02/20/2016 CLINICAL DATA:  Non productive cough. Hypotension and right lower quadrant abdominal discomfort following dialysis. No history of chills or nausea vomiting or diarrhea. EXAM: CHEST  2 VIEW COMPARISON:  Portable chest x-ray of Jun 23, 2014 and PA and lateral chest x-ray of March 06, 2014. FINDINGS: The lungs are hypoinflated. There are coarse lung markings at the left lung base which are more conspicuous than on the most recent lateral radiograph of March 06, 2014. The cardiac silhouette is enlarged. The central pulmonary vascularity is mildly engorged. There is calcification in the wall of the aortic arch. There is no definite pleural effusion. There is prominent thoracic kyphosis with loss of height of multiple lower thoracic vertebral bodies. IMPRESSION: Left basilar atelectasis or pneumonia possibly superimposed upon pre-existing scarring. Cardiomegaly with  mild central pulmonary vascular prominence consistent with low-grade CHF. These findings are accentuated by hypoinflation. Thoracic aortic atherosclerosis. Electronically Signed   By: David  [place] M.D.   On: 02/20/2016 16:41   Ct Chest Wo Contrast  Result Date: 02/20/2016 CLINICAL DATA:  61 year old male with possible pneumonia. EXAM: CT CHEST WITHOUT CONTRAST TECHNIQUE: Multidetector CT imaging of the chest was performed following the standard protocol without IV contrast. COMPARISON:  Chest radiograph dated 02/20/2016 FINDINGS: Evaluation of this exam is limited in the absence of intravenous contrast. Cardiovascular: There is mild  cardiomegaly. No pericardial effusion. There is advanced multi vessel coronary vascular calcification involving the LAD, RCA, and left circumflex artery. There is mild atherosclerotic calcification of the thoracic aorta. No aneurysmal dilatation. There is mild prominence of the main pulmonary trunk suggestive of a degree of pulmonary hypertension. Mediastinum/Nodes: There is no hilar adenopathy. Two well-circumscribed adjacent ovoid soft tissue densities in the anterior mediastinum measuring up to 2.3 cm individually and a combined diameter of approximately 2.7 x 3.0 cm noted. These are indeterminate but may represent mildly enlarged lymph nodes, ectopic thyroid tissue, or other anterior mediastinal mass. These lesions were seen on the CT of 03/30/2009 and demonstrate minimal interval increase in size suggestive of a benign etiology. Clinical correlation is recommended. Lungs/Pleura: There are bibasilar predominantly linear and streaky densities, likely combination of atelectasis and scarring. Infiltrate is less likely but not excluded. Clinical correlation is recommended. There is no pleural effusion or pneumothorax. The central airways are patent. Upper Abdomen: There is stone within the neck of the gallbladder. No pericholecystic fluid. Right renal atrophy. The upper abdomen is otherwise unremarkable. Musculoskeletal: Osteopenia with degenerative changes of the spine. No acute fracture. IMPRESSION: Bibasilar linear and streaky densities most likely atelectasis/scarring. Infiltrate is less likely but not excluded. Clinical correlation is recommended. Well-circumscribed bilobed soft tissue density in the anterior mediastinum with minimal interval change in size compared to the CT of 2011 likely of benign etiology. Mild cardiomegaly.  Multivessel coronary vascular disease. Electronically Signed   By: Elgie Collard M.D.   On: 02/20/2016 21:42   Ct Abdomen Pelvis W Contrast  Result Date: 02/20/2016 CLINICAL  DATA:  Mid abdomen pain after dialysis EXAM: CT ABDOMEN AND PELVIS WITH CONTRAST TECHNIQUE: Multidetector CT imaging of the abdomen and pelvis was performed using the standard protocol following bolus administration of intravenous contrast. CONTRAST:  ISOVUE-300 IOPAMIDOL (ISOVUE-300) INJECTION 61% COMPARISON:  CT abdomen and pelvis of 08/09/2011 FINDINGS: Lower chest: There is linear atelectasis at both lung bases right greater than left. No pleural effusion is seen. There is cardiomegaly present with calcification noted of the mitral and aortic valves. No pericardial effusion is seen. Coronary artery calcifications are noted diffusely. Hepatobiliary: The liver is unremarkable although linear artifacts are created by the patient's arms overlying the patient's sides limiting detail. No calcified gallstones are seen. Pancreas: The pancreas is normal in size and the pancreatic duct is not dilated. Spleen: The spleen is unremarkable. Adrenals/Urinary Tract: The adrenal glands appear normal. The kidneys are very atrophic with no evidence of hydronephrosis. Considerable renal artery calcifications are noted bilaterally. The ureters are normal in caliber. Transplanted kidney in the right pelvis is unremarkable with no hydronephrosis noted. A low-attenuation structure medially and anteriorly within the transplanted kidney is unchanged compared to the prior CT. On delayed images no contrast is seen to be excreted by the transplanted right pelvic kidney. Urinary bladder is decompressed and cannot be well evaluated. Stomach/Bowel: The stomach is not well  distended but no abnormality is evident. No small bowel distention is seen. There are scattered rectosigmoid colon diverticula present. No diverticulitis is noted. The colon is largely decompressed. Vascular/Lymphatic: The abdominal aorta is normal in caliber with advanced abdominal aortic atherosclerotic change present. No adenopathy is seen. Reproductive: The prostate  is normal in size. Other: A small umbilical hernia is present containing only fat. Musculoskeletal: The lumbar vertebrae are in normal alignment with degenerative change in the lower thoracic and upper lumbar spine. Left total hip replacement is present. IMPRESSION: 1. No explanation for the patient's mid abdominal pain is seen. 2. No contrast is seen to be excreted by the transplanted right pelvic kidney. The native kidneys appear atrophic. No hydronephrosis is seen. 3. Coronary artery calcifications. Significant abdominal aortic atherosclerotic change. Electronically Signed   By: Dwyane Dee M.D.   On: 02/20/2016 17:09   Dg Chest Port 1 View  Result Date: 02/23/2016 CLINICAL DATA:  Short of breath. EXAM: PORTABLE CHEST 1 VIEW COMPARISON:  02/20/2016 FINDINGS: Lung volumes are low. There is opacity at both lung bases similar to the prior exam, consistent with atelectasis. No convincing pneumonia. No pulmonary edema. Heart is enlarged but stable.  No gross mediastinal or hilar masses. No convincing pleural effusion.  No pneumothorax. Skeletal structures are demineralized but grossly intact. IMPRESSION: 1. Low lung volumes similar to the prior exam. Lung base opacity is noted bilaterally also stable consistent with atelectasis. No convincing pneumonia or pulmonary edema. Stable cardiomegaly. Electronically Signed   By: Amie Portland M.D.   On: 02/23/2016 11:45    Subjective: - no chest pain, shortness of breath, no abdominal pain, nausea or vomiting.   Discharge Exam: Vitals:   03/10/16 1100 03/10/16 1109  BP: (!) 86/54 (!) 79/56  Pulse: 64 68  Resp:  (!) 28  Temp:  97.5 F (36.4 C)   Vitals:   03/10/16 1000 03/10/16 1030 03/10/16 1100 03/10/16 1109  BP: (!) 84/39 (!) 83/61 (!) 86/54 (!) 79/56  Pulse: 66 65 64 68  Resp:    (!) 28  Temp:    97.5 F (36.4 C)  TempSrc:    Oral  SpO2:    100%  Weight:    103.5 kg (228 lb 2.8 oz)  Height:        General: Pt is alert, awake, not in acute  distress Cardiovascular: RRR, S1/S2 +, no rubs, no gallops Respiratory: CTA bilaterally, no wheezing, no rhonchi Abdominal: Soft, NT, ND, bowel sounds + Extremities: no edema, no cyanosis    The results of significant diagnostics from this hospitalization (including imaging, microbiology, ancillary and laboratory) are listed below for reference.     Microbiology: Recent Results (from the past 240 hour(s))  Blood Culture (routine x 2)     Status: None (Preliminary result)   Collection Time: 03/07/16  6:19 PM  Result Value Ref Range Status   Specimen Description BLOOD RIGHT ANTECUBITAL  Final   Special Requests AEROBIC BOTTLE ONLY  5CC  Final   Culture NO GROWTH 3 DAYS  Final   Report Status PENDING  Incomplete  Blood Culture (routine x 2)     Status: None (Preliminary result)   Collection Time: 03/07/16  7:03 PM  Result Value Ref Range Status   Specimen Description BLOOD RIGHT HAND  Final   Special Requests AEROBIC BOTTLE ONLY 5CC  Final   Culture NO GROWTH 3 DAYS  Final   Report Status PENDING  Incomplete     Labs: BNP (last  3 results)  Recent Labs  03/07/16 1819  BNP 1,588.8*   Basic Metabolic Panel:  Recent Labs Lab 03/07/16 1819 03/07/16 2251 03/08/16 1650 03/10/16 0213  NA 138 137 136 135  K 3.9 3.9 4.2 4.5  CL 99* 101 102 99*  CO2 30 23 22 25   GLUCOSE 80 78 181* 126*  BUN 10 11 21* 30*  CREATININE 3.23* 3.25* 4.30* 4.50*  CALCIUM 8.1* 7.6* 8.1* 8.3*  MG  --  1.6*  --   --   PHOS  --  1.9* 2.1*  --    Liver Function Tests:  Recent Labs Lab 03/07/16 1819 03/07/16 2251 03/08/16 1650  AST 31 29  --   ALT 17 16*  --   ALKPHOS 57 61  --   BILITOT 1.3* 1.7*  --   PROT 6.4* 6.5  --   ALBUMIN 2.3* 2.3* 2.1*   No results for input(s): LIPASE, AMYLASE in the last 168 hours. No results for input(s): AMMONIA in the last 168 hours. CBC:  Recent Labs Lab 03/07/16 1819 03/07/16 2251 03/08/16 1650 03/09/16 0528 03/10/16 0213  WBC 9.2 8.2 13.1*  17.7* 16.2*  NEUTROABS 5.1  --   --   --   --   HGB 12.6* 12.7* 12.2* 12.6* 13.5  HCT 40.1 40.5 38.2* 39.7 43.1  MCV 100.3* 101.3* 100.0 99.5 100.9*  PLT 118* 108* 154 160 161   Cardiac Enzymes:  Recent Labs Lab 03/07/16 1819  TROPONINI 0.31*   BNP: Invalid input(s): POCBNP CBG:  Recent Labs Lab 03/07/16 2346  GLUCAP 111*   D-Dimer No results for input(s): DDIMER in the last 72 hours. Hgb A1c No results for input(s): HGBA1C in the last 72 hours. Lipid Profile No results for input(s): CHOL, HDL, LDLCALC, TRIG, CHOLHDL, LDLDIRECT in the last 72 hours. Thyroid function studies No results for input(s): TSH, T4TOTAL, T3FREE, THYROIDAB in the last 72 hours.  Invalid input(s): FREET3 Anemia work up No results for input(s): VITAMINB12, FOLATE, FERRITIN, TIBC, IRON, RETICCTPCT in the last 72 hours. Urinalysis    Component Value Date/Time   COLORURINE YELLOW 03/18/2014 1730   APPEARANCEUR CLEAR 03/18/2014 1730   LABSPEC 1.013 03/18/2014 1730   PHURINE 5.0 03/18/2014 1730   GLUCOSEU NEGATIVE 03/18/2014 1730   HGBUR NEGATIVE 03/18/2014 1730   BILIRUBINUR NEGATIVE 03/18/2014 1730   KETONESUR NEGATIVE 03/18/2014 1730   PROTEINUR 30 (A) 03/18/2014 1730   UROBILINOGEN 0.2 03/18/2014 1730   NITRITE NEGATIVE 03/18/2014 1730   LEUKOCYTESUR NEGATIVE 03/18/2014 1730   Sepsis Labs Invalid input(s): PROCALCITONIN,  WBC,  LACTICIDVEN Microbiology Recent Results (from the past 240 hour(s))  Blood Culture (routine x 2)     Status: None (Preliminary result)   Collection Time: 03/07/16  6:19 PM  Result Value Ref Range Status   Specimen Description BLOOD RIGHT ANTECUBITAL  Final   Special Requests AEROBIC BOTTLE ONLY  5CC  Final   Culture NO GROWTH 3 DAYS  Final   Report Status PENDING  Incomplete  Blood Culture (routine x 2)     Status: None (Preliminary result)   Collection Time: 03/07/16  7:03 PM  Result Value Ref Range Status   Specimen Description BLOOD RIGHT HAND  Final    Special Requests AEROBIC BOTTLE ONLY 5CC  Final   Culture NO GROWTH 3 DAYS  Final   Report Status PENDING  Incomplete     Time coordinating discharge: 40 minutes  SIGNED:  Pamella Pert, [name]  [name] 03/11/2016, 12:46 PM Pager  [phone]  If 7PM-7AM, please contact night-coverage [email or web address] Password [email or web address]

## 2016-03-10 NOTE — Progress Notes (Signed)
[facility] Progress Note   Subjective: no complaints this AM ; feeling Ok.  Excited about [employer] vs [place] game tonight.     Vitals:   03/10/16 0633 03/10/16 0709 03/10/16 0711 03/10/16 0730  BP: 94/64 (!) 87/59 (!) 99/55 (!) 85/68  Pulse: 85 82 86 90  Resp: (!) 25 20    Temp:  97.9 F (36.6 C)    TempSrc:  Oral    SpO2: 100% 100%    Weight:  106.4 kg (234 lb 9.1 oz)    Height:        Inpatient medications: . allopurinol  100 mg Oral QHS  . amiodarone  200 mg Oral Daily  . heparin  5,000 Units Subcutaneous Q8H  . hydrocortisone  10 mg Oral Daily  . hydrocortisone  5 mg Oral QHS  . mouth rinse  15 mL Mouth Rinse BID  . midodrine  10 mg Oral TID WC  . pantoprazole  40 mg Oral Daily  . piperacillin-tazobactam (ZOSYN)  IV  3.375 g Intravenous Q12H  . sevelamer carbonate  800 mg Oral TID WC  . vancomycin  1,000 mg Intravenous Q M,W,F-HD    sodium chloride, sodium chloride, sodium chloride, acetaminophen, alteplase, calcium carbonate (dosed in mg elemental calcium), heparin, [START ON 03/11/2016] heparin, lidocaine (PF), lidocaine-prilocaine, ondansetron, pentafluoroprop-tetrafluoroeth  Exam: Alert, kyphotic adult, nad No jvd Chest clear to bases bilat RRR +SEM 2/6  ABd obese, ntnd , no ascites LE's chronic deformities of feet/ legs, 2+ pitting  pretib and pedal edema bilat Neuro minmal movement of LE's, o/w Ox3, NF  Dialysis: MWF [facility] 4h  105kg  2/2 bath  Hep 8000 then 2500  LUA AVF  - no meds  - Hb 14, CA 10.3, P 3.7, pth 105  - midodrine, coreg, amio, renvela 1 ac      Assessment/ Plan: 1. Hypotension - chronic issue, worsening over last couple yrs according to patient.  Baseline BP now is 80's and dialyzes in the 70's. Unclear cause, has known RV failure, is on steroids for poss AI.  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 No new suggestions.  We raised dry wt 4-5kg last admission hoping to help BP but it  hasn't helped and now legs are edematous. We will gradually taper down dry wt back towards 101kg in OP setting as tolerated. Remains on midodrine 10 mg TID.  Blood cultures negative x 2 days.  2. Adrenal insuff - on maint steroids 3. ESRD HD MWF, dialyzing today with UF goal 3L target.   4. Anemia of CKD - not an issue 5. 2HTPH - Phos low at 1.9 and 2.1, d/c renvela (was taking 800 mg TID) 6. PAF - cont coreg, amio. No anticoag d/t hx GIB 7. 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 EOL - briefly d/w patient in light of worsening BP's.  He wants full code.  9. Dispo - is ok for dc from renal standpoint    Bufford Buttner MD [facility] pgr [phone] 03/10/2016, 7:54 AM    Recent Labs Lab 03/07/16 2251 03/08/16 1650 03/10/16 0213  NA 137 136 135  K 3.9 4.2 4.5  CL 101 102 99*  CO2 23 22 25   GLUCOSE 78 181* 126*  BUN 11 21* 30*  CREATININE 3.25* 4.30* 4.50*  CALCIUM 7.6* 8.1* 8.3*  PHOS 1.9* 2.1*  --  Recent Labs Lab 03/07/16 1819 03/07/16 2251 03/08/16 1650  AST 31 29  --   ALT 17 16*  --   ALKPHOS 57 61  --   BILITOT 1.3* 1.7*  --   PROT 6.4* 6.5  --   ALBUMIN 2.3* 2.3* 2.1*    Recent Labs Lab 03/07/16 1819  03/08/16 1650 03/09/16 0528 03/10/16 0213  WBC 9.2  < > 13.1* 17.7* 16.2*  NEUTROABS 5.1  --   --   --   --   HGB 12.6*  < > 12.2* 12.6* 13.5  HCT 40.1  < > 38.2* 39.7 43.1  MCV 100.3*  < > 100.0 99.5 100.9*  PLT 118*  < > 154 160 161  < > = values in this interval not displayed. Iron/TIBC/Ferritin/ %Sat    Component Value Date/Time   IRON 45 06/16/2014 0445   TIBC 172 (L) 06/16/2014 0445   FERRITIN 240 03/16/2014 0723   IRONPCTSAT 26 06/16/2014 0445

## 2016-03-10 NOTE — Procedures (Signed)
Patient seen and examined on Hemodialysis. QB 450 mL/ min, UF goal 3L ; has chronic hypotension.  OK to keep systolic BP > 80 mm Hg as long as pt is asymptomatic.   Treatment adjusted as needed.  Bufford Buttner MD [facility] pgr [phone] 7:59 AM

## 2016-03-10 NOTE — Care Management Important Message (Signed)
Important Message  Patient Details  Name: Chad [name] MRN: [id number] Date of Birth: 01-04-1956   Medicare Important Message Given:  Yes    Hanley Hays, RN 03/10/2016, 2:13 PM

## 2016-03-10 NOTE — Progress Notes (Signed)
[address], [place] [address]

## 2016-03-12 LAB — CULTURE, BLOOD (ROUTINE X 2)
CULTURE: NO GROWTH
Culture: NO GROWTH

## 2016-04-22 ENCOUNTER — Telehealth: Payer: Self-pay | Admitting: Internal Medicine

## 2016-04-22 NOTE — Telephone Encounter (Signed)
Pt was called by Elease Hashimoto she was unable to get thru to the pt due to call drop. The appt on 04/22/16 was cancelled.

## 2016-04-22 NOTE — Telephone Encounter (Signed)
-----   Message from Carlus Pavlov, MD sent at 04/22/2016  7:56 AM EDT ----- Chad [name], This patient is on my schedule on Friday for a 15 minute visit. I have not seen him before and I am not sure why he is referred. I cannot see him then, I need 45 minutes with him since this would be an initial. Can you please cancel this visit? Ty, C

## 2016-04-23 ENCOUNTER — Telehealth: Payer: Self-pay

## 2016-04-23 NOTE — Telephone Encounter (Signed)
Attempted to contact patient to ask to see if he could come in for a new patient appointment tomorrow with Dr.Gherghe at 3:15. I called patient twice, no answer and no voicemail to leave message. Will try again.

## 2016-04-24 ENCOUNTER — Ambulatory Visit: Payer: Self-pay | Admitting: Internal Medicine

## 2016-04-27 DEATH — deceased

## 2016-05-12 IMAGING — CR DG CHEST 2V
2 series · 2 of 2 positions shown · non-contrast
Comparison: July 29, 2012.

CLINICAL DATA: Cardiomyopathy. Preop for fixation of femur
fracture.

EXAM:
CHEST  2 VIEW

[w chest pa]
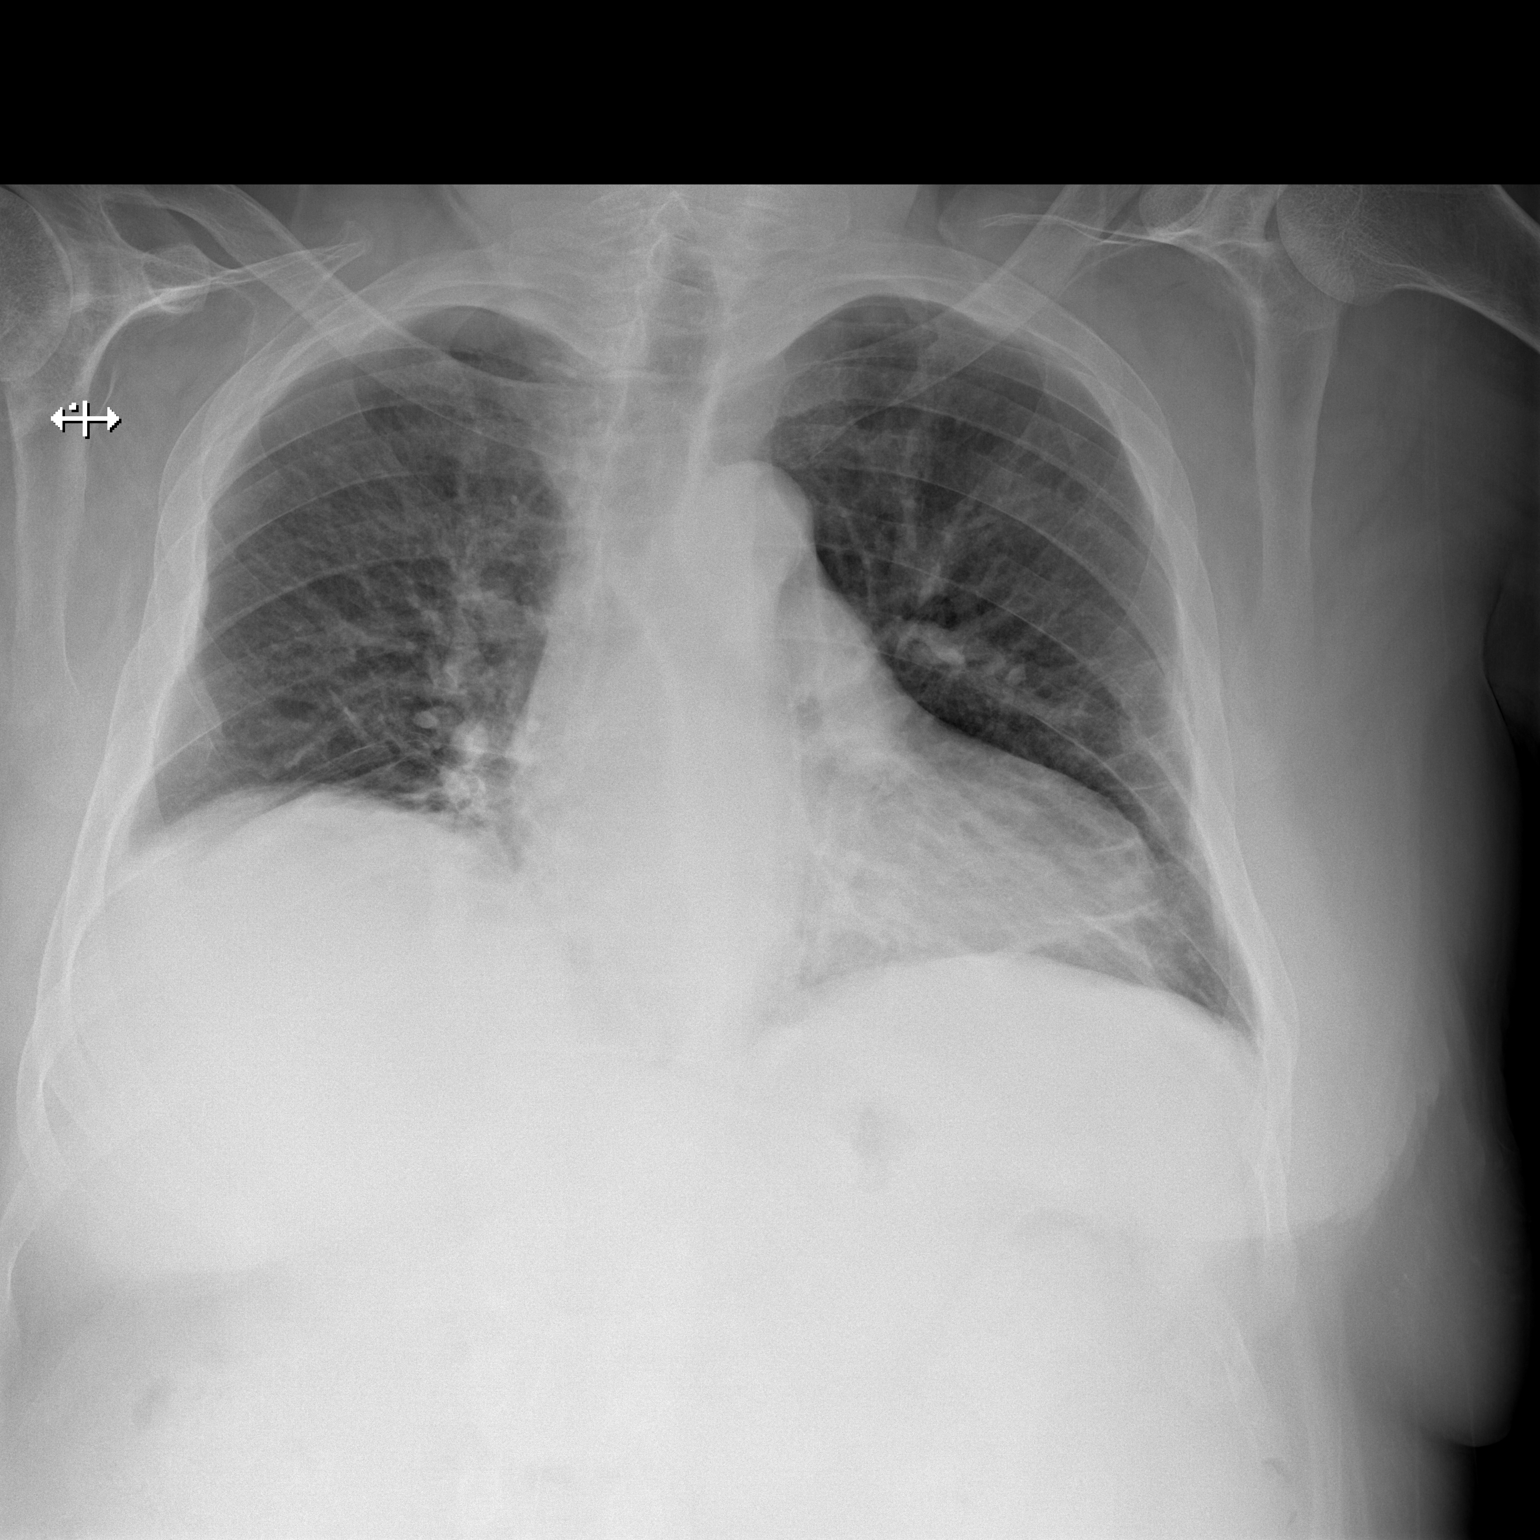

[w chest lat]
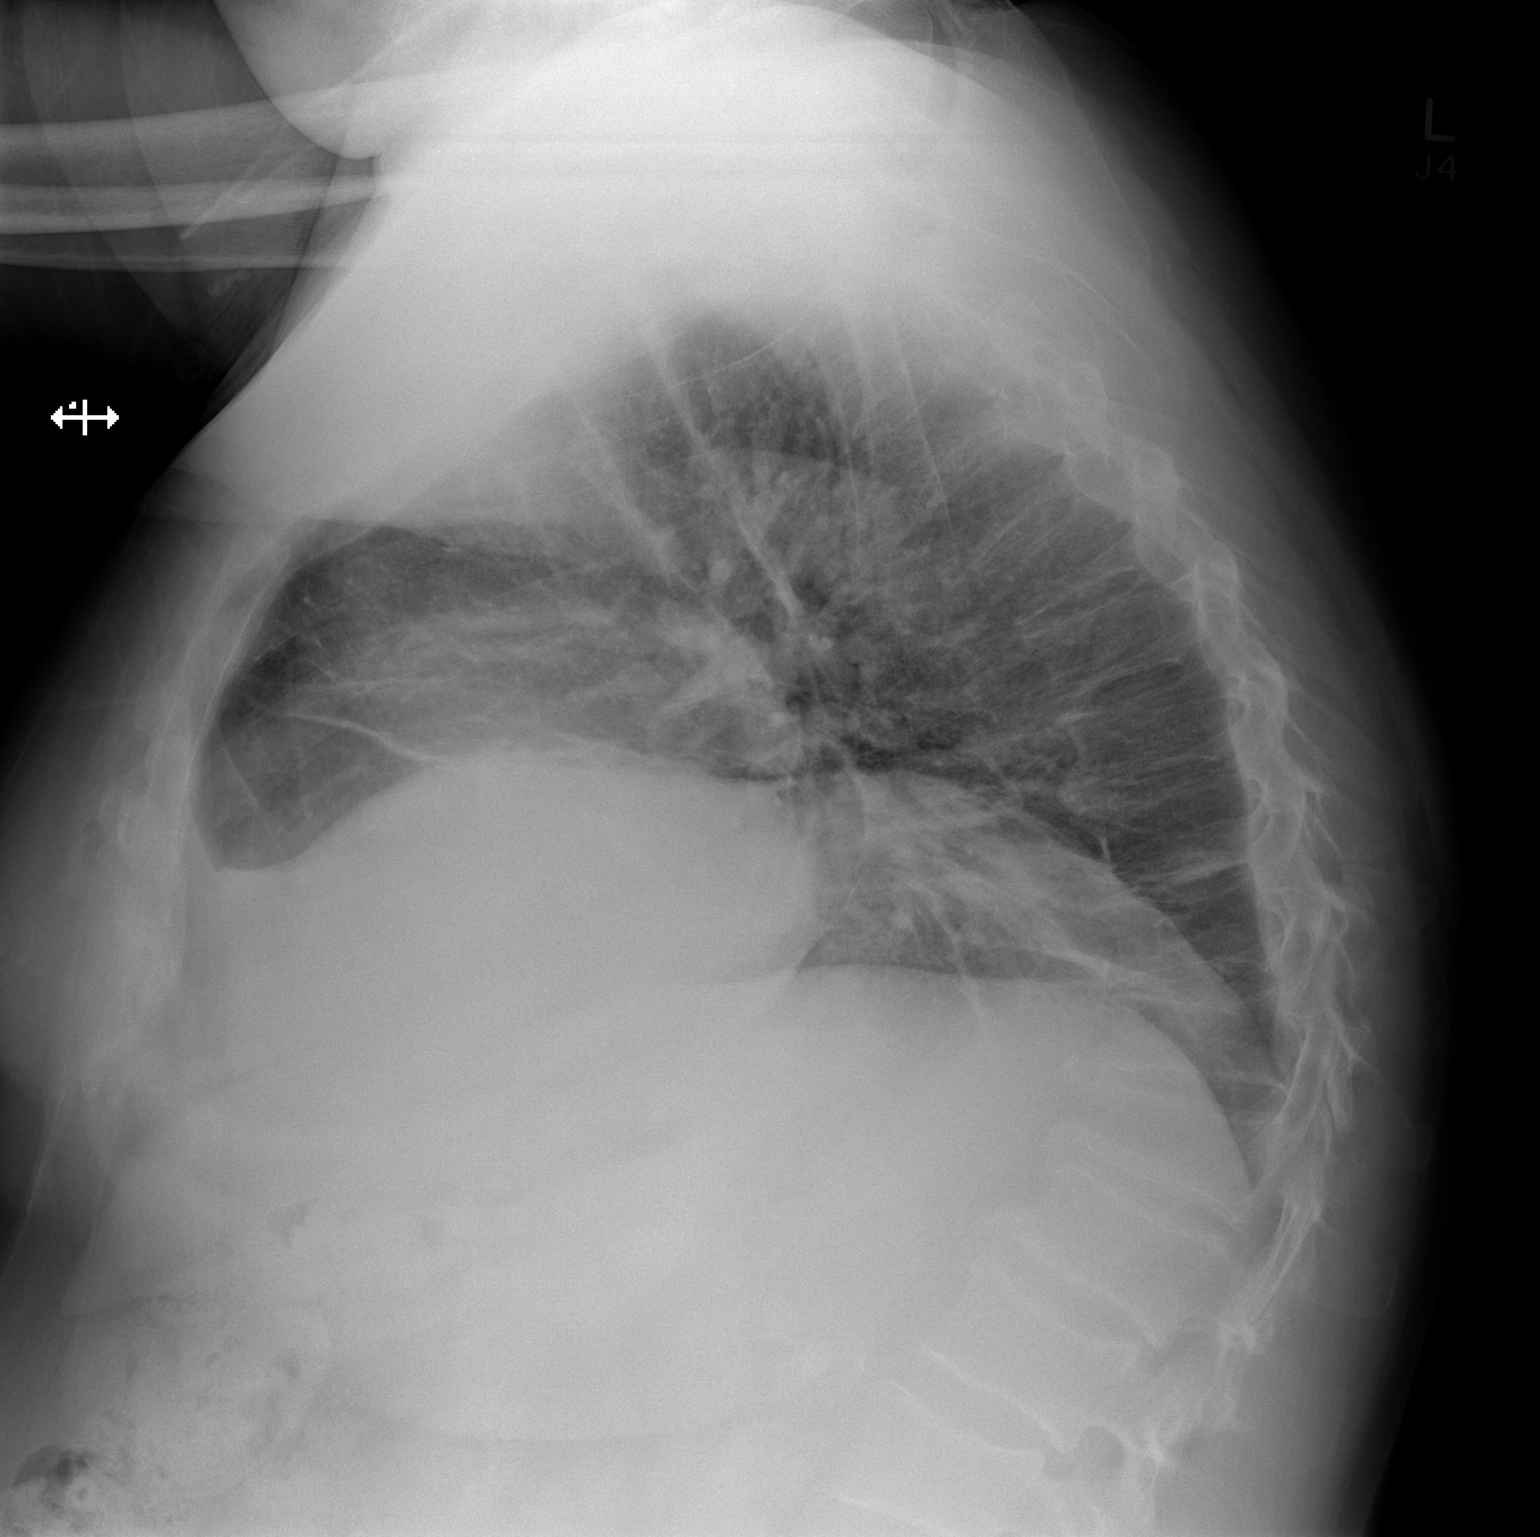

[2 of 2 positions shown; findings below may reference images not displayed]

FINDINGS: The heart size and mediastinal contours are within normal limits. No
pneumothorax or pleural effusion is noted. Mild bibasilar
subsegmental atelectasis is noted. The visualized skeletal
structures are unremarkable.
IMPRESSION: Mild bibasilar subsegmental atelectasis, left greater than right.
